# Patient Record
Sex: Male | Born: 1950 | Race: White | Hispanic: No | Marital: Single | Smoking: Former smoker
Health system: Southern US, Community
[De-identification: ages and names within clinical notes are randomized; demographics above are authoritative.]

## PROBLEM LIST (undated history)

## (undated) DIAGNOSIS — M1990 Unspecified osteoarthritis, unspecified site: Secondary | ICD-10-CM

## (undated) DIAGNOSIS — R5381 Other malaise: Secondary | ICD-10-CM

## (undated) DIAGNOSIS — E785 Hyperlipidemia, unspecified: Secondary | ICD-10-CM

## (undated) DIAGNOSIS — E1110 Type 2 diabetes mellitus with ketoacidosis without coma: Secondary | ICD-10-CM

## (undated) DIAGNOSIS — M545 Low back pain, unspecified: Secondary | ICD-10-CM

## (undated) DIAGNOSIS — I5032 Chronic diastolic (congestive) heart failure: Secondary | ICD-10-CM

## (undated) DIAGNOSIS — E119 Type 2 diabetes mellitus without complications: Secondary | ICD-10-CM

## (undated) DIAGNOSIS — Z9289 Personal history of other medical treatment: Secondary | ICD-10-CM

## (undated) DIAGNOSIS — I2510 Atherosclerotic heart disease of native coronary artery without angina pectoris: Secondary | ICD-10-CM

## (undated) DIAGNOSIS — L0591 Pilonidal cyst without abscess: Secondary | ICD-10-CM

## (undated) DIAGNOSIS — G4733 Obstructive sleep apnea (adult) (pediatric): Secondary | ICD-10-CM

## (undated) DIAGNOSIS — G629 Polyneuropathy, unspecified: Secondary | ICD-10-CM

## (undated) DIAGNOSIS — I10 Essential (primary) hypertension: Secondary | ICD-10-CM

## (undated) DIAGNOSIS — N492 Inflammatory disorders of scrotum: Secondary | ICD-10-CM

## (undated) DIAGNOSIS — R0902 Hypoxemia: Secondary | ICD-10-CM

## (undated) DIAGNOSIS — N183 Chronic kidney disease, stage 3 unspecified: Secondary | ICD-10-CM

## (undated) DIAGNOSIS — K922 Gastrointestinal hemorrhage, unspecified: Secondary | ICD-10-CM

## (undated) DIAGNOSIS — L0390 Cellulitis, unspecified: Secondary | ICD-10-CM

## (undated) DIAGNOSIS — I480 Paroxysmal atrial fibrillation: Secondary | ICD-10-CM

## (undated) DIAGNOSIS — G8929 Other chronic pain: Secondary | ICD-10-CM

## (undated) DIAGNOSIS — G5603 Carpal tunnel syndrome, bilateral upper limbs: Secondary | ICD-10-CM

## (undated) DIAGNOSIS — D509 Iron deficiency anemia, unspecified: Secondary | ICD-10-CM

## (undated) HISTORY — PX: EYE SURGERY: SHX253

## (undated) HISTORY — PX: APPENDECTOMY: SHX54

## (undated) HISTORY — DX: Hypoxemia: R09.02

## (undated) HISTORY — DX: Pilonidal cyst without abscess: L05.91

## (undated) HISTORY — DX: Other malaise: R53.81

## (undated) HISTORY — DX: Inflammatory disorders of scrotum: N49.2

## (undated) HISTORY — DX: Iron deficiency anemia, unspecified: D50.9

## (undated) HISTORY — DX: Chronic diastolic (congestive) heart failure: I50.32

## (undated) HISTORY — DX: Cellulitis, unspecified: L03.90

## (undated) HISTORY — PX: DEBRIDEMENT  FOOT: SUR387

## (undated) HISTORY — DX: Gastrointestinal hemorrhage, unspecified: K92.2

## (undated) HISTORY — PX: PILONIDAL CYST EXCISION: SHX744

## (undated) HISTORY — PX: TONSILLECTOMY: SUR1361

## (undated) HISTORY — DX: Paroxysmal atrial fibrillation: I48.0

## (undated) HISTORY — PX: ABDOMINAL SURGERY: SHX537

## (undated) HISTORY — DX: Hyperlipidemia, unspecified: E78.5

## (undated) HISTORY — DX: Atherosclerotic heart disease of native coronary artery without angina pectoris: I25.10

---

## 2008-07-12 HISTORY — PX: CARDIOVERSION: SHX1299

## 2008-07-29 ENCOUNTER — Ambulatory Visit: Payer: Self-pay | Admitting: Cardiology

## 2008-07-29 ENCOUNTER — Inpatient Hospital Stay (HOSPITAL_COMMUNITY): Admission: EM | Admit: 2008-07-29 | Discharge: 2008-08-07 | Payer: Self-pay | Admitting: Emergency Medicine

## 2008-07-30 ENCOUNTER — Encounter (INDEPENDENT_AMBULATORY_CARE_PROVIDER_SITE_OTHER): Payer: Self-pay | Admitting: Internal Medicine

## 2008-08-12 ENCOUNTER — Inpatient Hospital Stay (HOSPITAL_COMMUNITY): Admission: EM | Admit: 2008-08-12 | Discharge: 2008-08-27 | Payer: Self-pay | Admitting: Emergency Medicine

## 2008-08-12 ENCOUNTER — Ambulatory Visit: Payer: Self-pay | Admitting: Cardiology

## 2008-08-16 ENCOUNTER — Encounter (INDEPENDENT_AMBULATORY_CARE_PROVIDER_SITE_OTHER): Payer: Self-pay | Admitting: Internal Medicine

## 2008-08-19 ENCOUNTER — Ambulatory Visit: Payer: Self-pay | Admitting: Vascular Surgery

## 2008-08-19 ENCOUNTER — Encounter: Admission: RE | Admit: 2008-08-19 | Discharge: 2008-08-19 | Payer: Self-pay | Admitting: Internal Medicine

## 2008-08-27 ENCOUNTER — Encounter (INDEPENDENT_AMBULATORY_CARE_PROVIDER_SITE_OTHER): Payer: Self-pay | Admitting: *Deleted

## 2008-09-02 ENCOUNTER — Ambulatory Visit: Payer: Self-pay | Admitting: Family Medicine

## 2008-09-03 ENCOUNTER — Ambulatory Visit: Payer: Self-pay | Admitting: *Deleted

## 2008-09-23 ENCOUNTER — Encounter (HOSPITAL_BASED_OUTPATIENT_CLINIC_OR_DEPARTMENT_OTHER): Admission: RE | Admit: 2008-09-23 | Discharge: 2008-12-22 | Payer: Self-pay | Admitting: General Surgery

## 2008-09-23 DIAGNOSIS — L03119 Cellulitis of unspecified part of limb: Secondary | ICD-10-CM

## 2008-09-23 DIAGNOSIS — E118 Type 2 diabetes mellitus with unspecified complications: Secondary | ICD-10-CM

## 2008-09-23 DIAGNOSIS — E876 Hypokalemia: Secondary | ICD-10-CM | POA: Insufficient documentation

## 2008-09-23 DIAGNOSIS — E1165 Type 2 diabetes mellitus with hyperglycemia: Secondary | ICD-10-CM

## 2008-09-23 DIAGNOSIS — E785 Hyperlipidemia, unspecified: Secondary | ICD-10-CM | POA: Insufficient documentation

## 2008-09-23 DIAGNOSIS — L02419 Cutaneous abscess of limb, unspecified: Secondary | ICD-10-CM | POA: Insufficient documentation

## 2008-09-24 ENCOUNTER — Ambulatory Visit: Payer: Self-pay | Admitting: Cardiology

## 2008-09-24 ENCOUNTER — Encounter: Payer: Self-pay | Admitting: Cardiology

## 2008-09-24 LAB — CONVERTED CEMR LAB
AST: 23 units/L (ref 0–37)
Alkaline Phosphatase: 48 units/L (ref 39–117)
BUN: 26 mg/dL — ABNORMAL HIGH (ref 6–23)
Bilirubin, Direct: 0.2 mg/dL (ref 0.0–0.3)
CO2: 27 meq/L (ref 19–32)
Calcium: 9.3 mg/dL (ref 8.4–10.5)
Cholesterol: 158 mg/dL (ref 0–200)
Direct LDL: 72.4 mg/dL
GFR calc non Af Amer: 73.35 mL/min (ref >60)
HDL: 22.7 mg/dL — ABNORMAL LOW (ref >39.00)
Sodium: 141 meq/L (ref 135–145)
TSH: 0.39 microintl units/mL (ref 0.35–5.50)
Total Protein: 7 g/dL (ref 6.0–8.3)
VLDL: 79.4 mg/dL — ABNORMAL HIGH (ref 0.0–40.0)

## 2008-09-30 ENCOUNTER — Ambulatory Visit: Payer: Self-pay | Admitting: Cardiology

## 2008-10-01 ENCOUNTER — Encounter: Payer: Self-pay | Admitting: Family Medicine

## 2008-10-01 ENCOUNTER — Ambulatory Visit: Payer: Self-pay | Admitting: Internal Medicine

## 2008-10-01 LAB — CONVERTED CEMR LAB
ALT: 18 units/L (ref 0–53)
AST: 18 units/L (ref 0–37)
Albumin: 4.2 g/dL (ref 3.5–5.2)
Alkaline Phosphatase: 47 units/L (ref 39–117)
CO2: 21 meq/L (ref 19–32)
Calcium: 9 mg/dL (ref 8.4–10.5)
Chloride: 100 meq/L (ref 96–112)
Cholesterol: 190 mg/dL (ref 0–200)
Creatinine, Ser: 1.08 mg/dL (ref 0.40–1.50)
Eosinophils Absolute: 0.3 10*3/uL (ref 0.0–0.7)
Eosinophils Relative: 2 % (ref 0–5)
Lymphocytes Relative: 23 % (ref 12–46)
Lymphs Abs: 3.4 10*3/uL (ref 0.7–4.0)
MCV: 75.5 fL — ABNORMAL LOW (ref 78.0–100.0)
Monocytes Absolute: 1 10*3/uL (ref 0.1–1.0)
PSA: 0.68 ng/mL (ref 0.10–4.00)
Platelets: 288 10*3/uL (ref 150–400)
RBC: 5.27 M/uL (ref 4.22–5.81)
RDW: 15.8 % — ABNORMAL HIGH (ref 11.5–15.5)
TSH: 0.216 microintl units/mL — ABNORMAL LOW (ref 0.350–4.500)
Total Bilirubin: 0.6 mg/dL (ref 0.3–1.2)
Vit D, 25-Hydroxy: 15 ng/mL — ABNORMAL LOW (ref 30–89)
WBC: 14.7 10*3/uL — ABNORMAL HIGH (ref 4.0–10.5)

## 2008-10-03 ENCOUNTER — Ambulatory Visit: Payer: Self-pay | Admitting: Internal Medicine

## 2008-10-21 ENCOUNTER — Ambulatory Visit: Payer: Self-pay | Admitting: Internal Medicine

## 2008-10-22 ENCOUNTER — Ambulatory Visit: Payer: Self-pay | Admitting: Cardiology

## 2008-10-22 ENCOUNTER — Encounter: Payer: Self-pay | Admitting: Cardiology

## 2008-10-22 DIAGNOSIS — I4891 Unspecified atrial fibrillation: Secondary | ICD-10-CM

## 2008-10-22 DIAGNOSIS — I10 Essential (primary) hypertension: Secondary | ICD-10-CM

## 2008-10-23 ENCOUNTER — Ambulatory Visit: Payer: Self-pay | Admitting: Internal Medicine

## 2008-10-28 ENCOUNTER — Ambulatory Visit (HOSPITAL_COMMUNITY): Admission: RE | Admit: 2008-10-28 | Discharge: 2008-10-28 | Payer: Self-pay | Admitting: Cardiology

## 2008-11-04 ENCOUNTER — Ambulatory Visit: Payer: Self-pay | Admitting: Cardiology

## 2008-11-11 ENCOUNTER — Encounter (HOSPITAL_COMMUNITY): Admission: RE | Admit: 2008-11-11 | Discharge: 2009-02-09 | Payer: Self-pay | Admitting: Cardiology

## 2008-11-12 ENCOUNTER — Ambulatory Visit: Payer: Self-pay | Admitting: Cardiology

## 2008-11-13 ENCOUNTER — Encounter (INDEPENDENT_AMBULATORY_CARE_PROVIDER_SITE_OTHER): Payer: Self-pay | Admitting: *Deleted

## 2008-11-16 ENCOUNTER — Ambulatory Visit: Payer: Self-pay | Admitting: Cardiology

## 2008-11-17 ENCOUNTER — Inpatient Hospital Stay (HOSPITAL_COMMUNITY): Admission: EM | Admit: 2008-11-17 | Discharge: 2008-11-23 | Payer: Self-pay | Admitting: Emergency Medicine

## 2008-11-18 ENCOUNTER — Encounter: Payer: Self-pay | Admitting: Internal Medicine

## 2008-11-19 ENCOUNTER — Ambulatory Visit: Payer: Self-pay | Admitting: Internal Medicine

## 2008-12-02 ENCOUNTER — Ambulatory Visit: Payer: Self-pay | Admitting: Cardiovascular Disease

## 2008-12-25 ENCOUNTER — Encounter (INDEPENDENT_AMBULATORY_CARE_PROVIDER_SITE_OTHER): Payer: Self-pay | Admitting: Cardiology

## 2008-12-25 ENCOUNTER — Ambulatory Visit: Payer: Self-pay | Admitting: Internal Medicine

## 2008-12-26 ENCOUNTER — Encounter (HOSPITAL_BASED_OUTPATIENT_CLINIC_OR_DEPARTMENT_OTHER): Admission: RE | Admit: 2008-12-26 | Discharge: 2009-01-21 | Payer: Self-pay | Admitting: Internal Medicine

## 2008-12-27 LAB — CONVERTED CEMR LAB
BUN: 17 mg/dL (ref 6–23)
CO2: 29 meq/L (ref 19–32)
Calcium: 8.9 mg/dL (ref 8.4–10.5)
Chloride: 98 meq/L (ref 96–112)
Creatinine, Ser: 0.8 mg/dL (ref 0.4–1.5)
Glucose, Bld: 173 mg/dL — ABNORMAL HIGH (ref 70–99)
Potassium: 4 meq/L (ref 3.5–5.1)
Sodium: 139 meq/L (ref 135–145)

## 2009-01-02 ENCOUNTER — Ambulatory Visit: Payer: Self-pay | Admitting: Cardiology

## 2009-01-02 DIAGNOSIS — R9430 Abnormal result of cardiovascular function study, unspecified: Secondary | ICD-10-CM | POA: Insufficient documentation

## 2009-01-02 DIAGNOSIS — D649 Anemia, unspecified: Secondary | ICD-10-CM

## 2009-01-02 DIAGNOSIS — E079 Disorder of thyroid, unspecified: Secondary | ICD-10-CM | POA: Insufficient documentation

## 2009-01-03 ENCOUNTER — Encounter: Payer: Self-pay | Admitting: Cardiology

## 2009-01-03 ENCOUNTER — Telehealth: Payer: Self-pay | Admitting: Cardiology

## 2009-01-06 ENCOUNTER — Ambulatory Visit: Payer: Self-pay | Admitting: Internal Medicine

## 2009-01-06 ENCOUNTER — Ambulatory Visit: Payer: Self-pay | Admitting: Cardiology

## 2009-01-06 DIAGNOSIS — D689 Coagulation defect, unspecified: Secondary | ICD-10-CM

## 2009-01-06 DIAGNOSIS — K625 Hemorrhage of anus and rectum: Secondary | ICD-10-CM | POA: Insufficient documentation

## 2009-01-06 DIAGNOSIS — D509 Iron deficiency anemia, unspecified: Secondary | ICD-10-CM

## 2009-01-08 ENCOUNTER — Ambulatory Visit: Payer: Self-pay | Admitting: Cardiology

## 2009-01-15 ENCOUNTER — Encounter: Payer: Self-pay | Admitting: *Deleted

## 2009-01-15 ENCOUNTER — Ambulatory Visit: Payer: Self-pay | Admitting: Internal Medicine

## 2009-01-17 ENCOUNTER — Ambulatory Visit: Payer: Self-pay | Admitting: Internal Medicine

## 2009-01-22 ENCOUNTER — Emergency Department (HOSPITAL_COMMUNITY): Admission: EM | Admit: 2009-01-22 | Discharge: 2009-01-22 | Payer: Self-pay | Admitting: Emergency Medicine

## 2009-01-29 ENCOUNTER — Ambulatory Visit: Payer: Self-pay | Admitting: Cardiology

## 2009-01-29 LAB — CONVERTED CEMR LAB
POC INR: 2.3
Prothrombin Time: 18.4 s

## 2009-02-05 ENCOUNTER — Ambulatory Visit: Payer: Self-pay | Admitting: Cardiology

## 2009-02-05 LAB — CONVERTED CEMR LAB
POC INR: 3.3
Prothrombin Time: 21.8 s

## 2009-02-10 ENCOUNTER — Telehealth (INDEPENDENT_AMBULATORY_CARE_PROVIDER_SITE_OTHER): Payer: Self-pay | Admitting: *Deleted

## 2009-02-18 ENCOUNTER — Ambulatory Visit: Payer: Self-pay | Admitting: Internal Medicine

## 2009-02-18 LAB — CONVERTED CEMR LAB: Prothrombin Time: 16.8 s

## 2009-02-26 ENCOUNTER — Ambulatory Visit: Payer: Self-pay | Admitting: Cardiology

## 2009-03-04 ENCOUNTER — Ambulatory Visit: Payer: Self-pay | Admitting: Internal Medicine

## 2009-03-04 LAB — CONVERTED CEMR LAB: POC INR: 3.2

## 2009-03-07 ENCOUNTER — Telehealth (INDEPENDENT_AMBULATORY_CARE_PROVIDER_SITE_OTHER): Payer: Self-pay | Admitting: *Deleted

## 2009-03-12 ENCOUNTER — Ambulatory Visit: Payer: Self-pay | Admitting: Internal Medicine

## 2009-03-14 ENCOUNTER — Telehealth: Payer: Self-pay | Admitting: Cardiology

## 2009-03-20 ENCOUNTER — Ambulatory Visit: Payer: Self-pay | Admitting: Cardiovascular Disease

## 2009-03-20 LAB — CONVERTED CEMR LAB: POC INR: 3.1

## 2009-03-24 ENCOUNTER — Telehealth: Payer: Self-pay | Admitting: Cardiology

## 2009-03-27 ENCOUNTER — Ambulatory Visit: Payer: Self-pay | Admitting: Cardiology

## 2009-03-27 LAB — CONVERTED CEMR LAB: POC INR: 2.9

## 2009-04-03 ENCOUNTER — Ambulatory Visit: Payer: Self-pay | Admitting: Internal Medicine

## 2009-04-07 ENCOUNTER — Encounter: Payer: Self-pay | Admitting: Family Medicine

## 2009-04-07 ENCOUNTER — Ambulatory Visit: Payer: Self-pay | Admitting: Internal Medicine

## 2009-04-07 LAB — CONVERTED CEMR LAB
AST: 13 units/L (ref 0–37)
Basophils Relative: 0 % (ref 0–1)
Benzodiazepines.: NEGATIVE
CO2: 24 meq/L (ref 19–32)
Calcium: 9.2 mg/dL (ref 8.4–10.5)
Cocaine Metabolites: NEGATIVE
Creatinine, Ser: 0.93 mg/dL (ref 0.40–1.50)
Creatinine,U: 105.9 mg/dL
Free T4: 1.09 ng/dL (ref 0.80–1.80)
Glucose, Bld: 302 mg/dL — ABNORMAL HIGH (ref 70–99)
Hemoglobin: 11.6 g/dL — ABNORMAL LOW (ref 13.0–17.0)
Hgb A1c MFr Bld: 9.8 % — ABNORMAL HIGH (ref 4.6–6.1)
MCHC: 32.1 g/dL (ref 30.0–36.0)
MCV: 74.6 fL — ABNORMAL LOW (ref 78.0–100.0)
Marijuana Metabolite: NEGATIVE
Monocytes Absolute: 0.9 10*3/uL (ref 0.1–1.0)
Neutro Abs: 6.1 10*3/uL (ref 1.7–7.7)
Neutrophils Relative %: 58 % (ref 43–77)
Phencyclidine (PCP): NEGATIVE
Platelets: 252 10*3/uL (ref 150–400)
Propoxyphene: NEGATIVE
RDW: 15.3 % (ref 11.5–15.5)
Sodium: 139 meq/L (ref 135–145)
TSH: 0.22 microintl units/mL — ABNORMAL LOW (ref 0.350–4.500)

## 2009-04-15 ENCOUNTER — Telehealth: Payer: Self-pay | Admitting: Cardiology

## 2009-04-15 ENCOUNTER — Encounter: Payer: Self-pay | Admitting: Cardiology

## 2009-04-16 ENCOUNTER — Telehealth: Payer: Self-pay | Admitting: Cardiology

## 2009-04-18 ENCOUNTER — Encounter (INDEPENDENT_AMBULATORY_CARE_PROVIDER_SITE_OTHER): Payer: Self-pay | Admitting: Pharmacist

## 2009-04-18 ENCOUNTER — Encounter: Payer: Self-pay | Admitting: Internal Medicine

## 2009-04-18 LAB — CONVERTED CEMR LAB
POC INR: 1.99
Prothrombin Time: 20.9 s

## 2009-04-28 ENCOUNTER — Encounter: Payer: Self-pay | Admitting: Internal Medicine

## 2009-04-30 ENCOUNTER — Encounter (INDEPENDENT_AMBULATORY_CARE_PROVIDER_SITE_OTHER): Payer: Self-pay | Admitting: Pharmacist

## 2009-04-30 ENCOUNTER — Encounter: Payer: Self-pay | Admitting: Internal Medicine

## 2009-04-30 LAB — CONVERTED CEMR LAB
POC INR: 1.99
Prothrombin Time: 20.9 s

## 2009-05-08 ENCOUNTER — Ambulatory Visit: Payer: Self-pay | Admitting: Cardiology

## 2009-05-09 ENCOUNTER — Ambulatory Visit: Payer: Self-pay | Admitting: Cardiology

## 2009-05-09 ENCOUNTER — Encounter (INDEPENDENT_AMBULATORY_CARE_PROVIDER_SITE_OTHER): Payer: Self-pay | Admitting: *Deleted

## 2009-05-12 ENCOUNTER — Telehealth (INDEPENDENT_AMBULATORY_CARE_PROVIDER_SITE_OTHER): Payer: Self-pay | Admitting: *Deleted

## 2009-05-15 ENCOUNTER — Ambulatory Visit: Payer: Self-pay | Admitting: Internal Medicine

## 2009-05-15 LAB — CONVERTED CEMR LAB: POC INR: 2.6

## 2009-05-16 ENCOUNTER — Encounter: Payer: Self-pay | Admitting: Cardiology

## 2009-05-16 ENCOUNTER — Ambulatory Visit: Payer: Self-pay | Admitting: Cardiology

## 2009-05-16 ENCOUNTER — Ambulatory Visit (HOSPITAL_COMMUNITY): Admission: RE | Admit: 2009-05-16 | Discharge: 2009-05-16 | Payer: Self-pay | Admitting: Cardiology

## 2009-05-21 ENCOUNTER — Ambulatory Visit: Payer: Self-pay | Admitting: Internal Medicine

## 2009-05-22 ENCOUNTER — Telehealth: Payer: Self-pay | Admitting: Cardiology

## 2009-05-28 ENCOUNTER — Ambulatory Visit: Payer: Self-pay | Admitting: Cardiovascular Disease

## 2009-05-28 LAB — CONVERTED CEMR LAB: POC INR: 4

## 2009-06-02 ENCOUNTER — Telehealth (INDEPENDENT_AMBULATORY_CARE_PROVIDER_SITE_OTHER): Payer: Self-pay | Admitting: *Deleted

## 2009-06-04 ENCOUNTER — Ambulatory Visit: Payer: Self-pay | Admitting: Internal Medicine

## 2009-06-10 ENCOUNTER — Telehealth: Payer: Self-pay | Admitting: Internal Medicine

## 2009-06-11 ENCOUNTER — Ambulatory Visit: Payer: Self-pay | Admitting: Cardiology

## 2009-06-11 ENCOUNTER — Encounter (INDEPENDENT_AMBULATORY_CARE_PROVIDER_SITE_OTHER): Payer: Self-pay | Admitting: *Deleted

## 2009-06-11 ENCOUNTER — Encounter: Payer: Self-pay | Admitting: Cardiology

## 2009-06-17 ENCOUNTER — Ambulatory Visit: Payer: Self-pay | Admitting: Cardiovascular Disease

## 2009-06-18 ENCOUNTER — Ambulatory Visit: Payer: Self-pay | Admitting: Cardiology

## 2009-06-18 ENCOUNTER — Telehealth (INDEPENDENT_AMBULATORY_CARE_PROVIDER_SITE_OTHER): Payer: Self-pay | Admitting: Cardiology

## 2009-06-18 ENCOUNTER — Ambulatory Visit (HOSPITAL_COMMUNITY): Admission: RE | Admit: 2009-06-18 | Discharge: 2009-06-18 | Payer: Self-pay | Admitting: Cardiology

## 2009-06-25 ENCOUNTER — Ambulatory Visit: Payer: Self-pay | Admitting: Cardiology

## 2009-07-02 ENCOUNTER — Ambulatory Visit: Payer: Self-pay | Admitting: Internal Medicine

## 2009-07-16 ENCOUNTER — Ambulatory Visit: Payer: Self-pay | Admitting: Cardiovascular Disease

## 2009-08-04 ENCOUNTER — Telehealth (INDEPENDENT_AMBULATORY_CARE_PROVIDER_SITE_OTHER): Payer: Self-pay | Admitting: *Deleted

## 2009-08-07 ENCOUNTER — Encounter: Payer: Self-pay | Admitting: Cardiology

## 2009-08-08 ENCOUNTER — Ambulatory Visit: Payer: Self-pay | Admitting: Cardiology

## 2009-08-14 ENCOUNTER — Ambulatory Visit: Payer: Self-pay | Admitting: Cardiology

## 2009-08-31 ENCOUNTER — Emergency Department (HOSPITAL_COMMUNITY): Admission: EM | Admit: 2009-08-31 | Discharge: 2009-08-31 | Payer: Self-pay | Admitting: Family Medicine

## 2009-09-01 ENCOUNTER — Ambulatory Visit: Payer: Self-pay | Admitting: Internal Medicine

## 2009-09-05 ENCOUNTER — Ambulatory Visit: Payer: Self-pay | Admitting: Internal Medicine

## 2009-09-11 ENCOUNTER — Ambulatory Visit: Payer: Self-pay | Admitting: Cardiology

## 2009-09-24 ENCOUNTER — Ambulatory Visit: Payer: Self-pay | Admitting: Internal Medicine

## 2009-09-24 LAB — CONVERTED CEMR LAB
Glucose, Bld: 196 mg/dL — ABNORMAL HIGH (ref 70–99)
Hgb A1c MFr Bld: 7.5 % — ABNORMAL HIGH (ref 4.6–6.1)
Potassium: 3.8 meq/L (ref 3.5–5.3)
Sodium: 141 meq/L (ref 135–145)

## 2009-09-25 ENCOUNTER — Ambulatory Visit: Payer: Self-pay | Admitting: Internal Medicine

## 2009-09-25 LAB — CONVERTED CEMR LAB: POC INR: 1.8

## 2009-10-02 ENCOUNTER — Ambulatory Visit: Payer: Self-pay | Admitting: Internal Medicine

## 2009-10-08 ENCOUNTER — Ambulatory Visit: Payer: Self-pay | Admitting: Cardiology

## 2009-10-09 LAB — CONVERTED CEMR LAB
Alkaline Phosphatase: 45 units/L (ref 39–117)
BUN: 14 mg/dL (ref 6–23)
Bilirubin, Direct: 0.1 mg/dL (ref 0.0–0.3)
Chloride: 105 meq/L (ref 96–112)
Glucose, Bld: 56 mg/dL — ABNORMAL LOW (ref 70–99)
Potassium: 3.4 meq/L — ABNORMAL LOW (ref 3.5–5.1)

## 2009-10-28 ENCOUNTER — Ambulatory Visit: Payer: Self-pay | Admitting: Cardiovascular Disease

## 2009-10-28 ENCOUNTER — Ambulatory Visit: Payer: Self-pay | Admitting: Cardiology

## 2009-10-28 ENCOUNTER — Encounter (INDEPENDENT_AMBULATORY_CARE_PROVIDER_SITE_OTHER): Payer: Self-pay | Admitting: *Deleted

## 2009-10-29 ENCOUNTER — Ambulatory Visit: Payer: Self-pay | Admitting: Family Medicine

## 2009-10-29 ENCOUNTER — Telehealth (INDEPENDENT_AMBULATORY_CARE_PROVIDER_SITE_OTHER): Payer: Self-pay | Admitting: *Deleted

## 2009-11-06 ENCOUNTER — Encounter (INDEPENDENT_AMBULATORY_CARE_PROVIDER_SITE_OTHER): Payer: Self-pay | Admitting: Internal Medicine

## 2009-11-06 ENCOUNTER — Ambulatory Visit: Payer: Self-pay | Admitting: Family Medicine

## 2009-11-13 LAB — CONVERTED CEMR LAB
BUN: 21 mg/dL (ref 6–23)
CO2: 26 meq/L (ref 19–32)
Calcium: 9.4 mg/dL (ref 8.4–10.5)
Chloride: 102 meq/L (ref 96–112)
Cholesterol: 186 mg/dL (ref 0–200)
Creatinine, Ser: 1.13 mg/dL (ref 0.40–1.50)
HDL: 41 mg/dL (ref >39)
Total CHOL/HDL Ratio: 4.5

## 2009-11-19 ENCOUNTER — Ambulatory Visit: Payer: Self-pay | Admitting: Cardiology

## 2009-11-19 LAB — CONVERTED CEMR LAB: POC INR: 2.8

## 2009-11-27 ENCOUNTER — Ambulatory Visit: Payer: Self-pay | Admitting: Family Medicine

## 2009-12-25 ENCOUNTER — Ambulatory Visit: Payer: Self-pay | Admitting: Cardiology

## 2010-01-22 ENCOUNTER — Encounter (INDEPENDENT_AMBULATORY_CARE_PROVIDER_SITE_OTHER): Payer: Self-pay | Admitting: *Deleted

## 2010-02-03 ENCOUNTER — Ambulatory Visit: Payer: Self-pay | Admitting: Cardiology

## 2010-02-24 ENCOUNTER — Telehealth (INDEPENDENT_AMBULATORY_CARE_PROVIDER_SITE_OTHER): Payer: Self-pay | Admitting: *Deleted

## 2010-03-17 ENCOUNTER — Telehealth: Payer: Self-pay | Admitting: Cardiology

## 2010-03-31 ENCOUNTER — Encounter: Payer: Self-pay | Admitting: Cardiology

## 2010-03-31 ENCOUNTER — Encounter (INDEPENDENT_AMBULATORY_CARE_PROVIDER_SITE_OTHER): Payer: Self-pay | Admitting: Pharmacist

## 2010-03-31 LAB — CONVERTED CEMR LAB
POC INR: 6.1
Prothrombin Time: 61.9 s

## 2010-04-02 ENCOUNTER — Encounter: Payer: Self-pay | Admitting: Cardiology

## 2010-04-06 ENCOUNTER — Encounter: Payer: Self-pay | Admitting: Cardiovascular Disease

## 2010-04-06 LAB — CONVERTED CEMR LAB: POC INR: 1.3

## 2010-08-02 ENCOUNTER — Encounter: Payer: Self-pay | Admitting: Cardiology

## 2010-08-06 ENCOUNTER — Telehealth: Payer: Self-pay | Admitting: Cardiology

## 2010-08-11 NOTE — Medication Information (Signed)
Summary: rov/ewj  Anticoagulant Therapy  Managed by: Bethena Midget, RN, BSN Referring MD: [name] MD, Arlys John PCP: Alfonse Ras, MD Supervising MD: Gala Romney MD, Reuel Boom Indication 1: Atrial Fibrillation Indication 2:  - Lab Used: [facility] [place] Site: [address] INR POC 1.8 INR RANGE 2.0-3.0  Dietary changes: no    Health status changes: no    Bleeding/hemorrhagic complications: no    Recent/future hospitalizations: no    Any changes in medication regimen? yes       Details: 08/08/09 Dr. [name] decreased Amiodarone from 400mg s BID to 200mg  daily  Recent/future dental: no  Any missed doses?: no       Is patient compliant with meds? yes      Comments: Lyrica dose increased last week  Current Medications (verified): 1)  Furosemide 40 Mg Tabs (Furosemide) .... Take 1 Tab By Mouth Once Daily 2)  Glucovance 5-500 Mg Tabs (Glyburide-Metformin) .[name].. 1 Tab By Mouth Two Times A Day 3)  Warfarin Sodium 5 Mg Tabs (Warfarin Sodium) .... Take As Directed By Coumadin Clinic. 4)  Metoprolol Succinate 100 Mg Xr24h-Tab (Metoprolol Succinate) .[name].. 1 1/2 Tab By Mouth Once Daily 5)  Novolog 100 Unit/ml Soln (Insulin Aspart) .... Sliding Scale 6)  Lantus 100 Unit/ml Soln (Insulin Glargine) .... Take 75 Units Subcutaneously At Bedtime 7)  Multivitamins  Tabs (Multiple Vitamin) .... Take 1 Tablet By Mouth Once A Day 8)  Gemfibrozil 600 Mg Tabs (Gemfibrozil) .[name].. 1 Tab By Mouth Two Times A Day 9)  Amiodarone Hcl 200 Mg Tabs (Amiodarone Hcl) .... Take One Tablet By Mouth Once A Day 10)  Lyrica .... 2 Tabs By Mouth Bid  Allergies: No Known Drug Allergies  Anticoagulation Management History:      The patient is taking warfarin and comes in today for a routine follow up visit.  Positive risk factors for bleeding include presence of serious comorbidities.  Negative risk factors for bleeding include an age less than 39 years old.  The bleeding index is 'intermediate risk'.  Positive  CHADS2 values include History of HTN and History of Diabetes.  Negative CHADS2 values include Age > 59 years old.  Anticoagulation responsible provider: Bensimhon MD, Reuel Boom.  INR POC: 1.8.  Cuvette Lot#: [id number].  Exp: 10/2010.    Anticoagulation Management Assessment/Plan:      The patient's current anticoagulation dose is Warfarin sodium 5 mg tabs: Take as directed by coumadin clinic..  The target INR is 2.0-3.0.  The next INR is due 09/11/2009.  Anticoagulation instructions were given to patient.  Results were reviewed/authorized by Bethena Midget, RN, BSN.  He was notified by Bethena Midget, RN, BSN.         Prior Anticoagulation Instructions: INR 3.3  take 1/2 tablet tomorrow then start taking 5mg  daily.  Recheck in 2 weeks.    Current Anticoagulation Instructions: INR 1.8 Today take extra 1/2 tablet then change dose to 1 tablet everyday except 1 1/2 tablets on Wednesdays. Recheck in 10 days.

## 2010-08-11 NOTE — Medication Information (Signed)
Summary: rov/cb  Anticoagulant Therapy  Managed by: Weston Brass, PharmD Referring MD: [name] MD, Arlys John PCP: Alfonse Ras, MD Supervising MD: Myrtis Ser MD, Tinnie Gens Indication 1: Atrial Fibrillation Indication 2:  - Lab Used: [facility] [place] Site: [address] INR POC 2.8 INR RANGE 2.0-3.0  Dietary changes: no    Health status changes: no    Bleeding/hemorrhagic complications: no    Recent/future hospitalizations: no    Any changes in medication regimen? no    Recent/future dental: no  Any missed doses?: no       Is patient compliant with meds? yes       Allergies: No Known Drug Allergies  Anticoagulation Management History:      The patient is taking warfarin and comes in today for a routine follow up visit.  Positive risk factors for bleeding include presence of serious comorbidities.  Negative risk factors for bleeding include an age less than 64 years old.  The bleeding index is 'intermediate risk'.  Positive CHADS2 values include History of CHF, History of HTN, and History of Diabetes.  Negative CHADS2 values include Age > 3 years old.  Anticoagulation responsible provider: Myrtis Ser MD, Tinnie Gens.  INR POC: 2.8.  Cuvette Lot#: [id number].  Exp: 02/2011.    Anticoagulation Management Assessment/Plan:      The patient's current anticoagulation dose is Warfarin sodium 5 mg tabs: Take as directed by coumadin clinic..  The target INR is 2.0-3.0.  The next INR is due 12/17/2009.  Anticoagulation instructions were given to patient.  Results were reviewed/authorized by Weston Brass, PharmD.  He was notified by Weston Brass PharmD.         Prior Anticoagulation Instructions: INR 2.0. Take 1 tablet daily except 1.5 tablets Tues and Thurs.  Current Anticoagulation Instructions: INR 2.8  Continue same dose of 1 tablet every day except 1 1/2 tablets on Tuesday and Thursday

## 2010-08-11 NOTE — Assessment & Plan Note (Signed)
Summary: F3M/DM   Primary Provider:  Alfonse Ras, MD  CC:  sob.  History of Present Illness: Pleasant gentleman that I have  seen for atrial fibrillation. Previously admitted to [facility] with weakness and bradycardia. This was felt secondary to AV nodal blocking agents which were held and he did improve. He ultimately underwent successful TEE guided cardioversion. Note LV function by echocardiogram in May was normal. A Myoview in early May revealed fixed defect in the inferior wall with mild reversibility suggesting infarct with peri-infarct ischemia. Septal hypokinesis and apical dyskinesis. Left ventricular ejection fraction equal 51 %. We are treating this medically as the patient has no symptoms. He is also morbidly obese and diaphragmatic attenuation is a possibility. Note a TSH was low but her free T3 and free T4 were normal. He has been followed by endocrinology for this. I last saw him on November of 2010. We scheduled a cardioversion which was performed on June 18, 2009. The procedure was successful. Since then he has mild dyspnea on exertion relieved with rest. It is not associated with chest pain. There is no orthopnea, PND, pedal edema, palpitations, syncope or bleeding.  Current Medications (verified): 1)  Furosemide 40 Mg Tabs (Furosemide) .... Take 1 Tab By Mouth Once Daily 2)  Glucovance 5-500 Mg Tabs (Glyburide-Metformin) .[name].. 1 Tab By Mouth Two Times A Day 3)  Warfarin Sodium 5 Mg Tabs (Warfarin Sodium) .... Take As Directed By Coumadin Clinic. 4)  Metoprolol Succinate 100 Mg Xr24h-Tab (Metoprolol Succinate) .[name].. 1 1/2 Tab By Mouth Once Daily 5)  Novolog 100 Unit/ml Soln (Insulin Aspart) .... Sliding Scale 6)  Lantus 100 Unit/ml Soln (Insulin Glargine) .... Take 75 Units Subcutaneously At Bedtime 7)  Multivitamins  Tabs (Multiple Vitamin) .... Take 1 Tablet By Mouth Once A Day 8)  Gemfibrozil 600 Mg Tabs (Gemfibrozil) .[name].. 1 Tab By Mouth Two Times A Day 9)  Amiodarone  Hcl 200 Mg Tabs (Amiodarone Hcl) .... Take Two Tablets By Mouth Twice A Day 10)  Lyrica .... 2 Tabs By Mouth Once Daily  Allergies: No Known Drug Allergies  Past History:  Past Medical History: Reviewed history from 05/09/2009 and no changes required. Anemia Atrial Fibrillation Diabetes Type 1 Hyperlipidemia hypertension recurrent cellulitis morbid obesity acute renal insufficiency improved  Past Surgical History: Reviewed history from 01/06/2009 and no changes required. 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 Patient is a former smoker. -stopped 10 years ago Daily Caffeine Use-2 cups daily Illicit Drug Use - no Patient does not get regular exercise.   Review of Systems       no fevers or chills, productive cough, hemoptysis, dysphasia, odynophagia, melena, hematochezia, dysuria, hematuria, rash, seizure activity, orthopnea, PND, pedal edema, claudication. Remaining systems are negative.   Vital Signs:  Patient profile:   60 year old male Height:      72 inches Weight:      347 pounds BMI:     47.23 Pulse rate:   68 / minute Resp:     14 per minute BP sitting:   124 / 57  (left arm)  Vitals Entered By: Kem Parkinson (August 08, 2009 11:43 AM)  Physical Exam  General:  Well-developed obese in no acute distress.  Skin is warm  and dry.  HEENT is normal.  Neck is supple. No thyromegaly.  Chest is clear to auscultation with normal expansion.  Cardiovascular exam is regular rate and rhythm.  Abdominal exam nontender or distended. No masses palpated. Extremities show no edema. neuro grossly intact    EKG  Procedure date:  08/08/2009  Findings:      Sinus rhythm at a rate of 68. First degree AV block.  Nonspecific ST changes.  Impression & Recommendations:  Problem # 1:  ATRIAL FIBRILLATION (ICD-427.31) The patient remains in sinus rhythm status post cardioversion. I will decrease his amiodarone to 200 mg p.o. daily. He will need to continue on his Coumadin with a goal INR of 2-3. In 2 months he will have a TSH, liver functions and chest x-ray given his amiodarone use. He will return to see me in 3 months. His updated medication list for this problem includes:    Warfarin Sodium 5 Mg Tabs (Warfarin sodium) .[name]... Take as directed by coumadin clinic.    Metoprolol Succinate 100 Mg Xr24h-tab (Metoprolol succinate) .[name]... 1 1/2 tab by mouth once daily    Amiodarone Hcl 200 Mg Tabs (Amiodarone hcl) .[name]... Take one tablet by mouth once a day  Problem # 2:  CARDIOVASCULAR FUNCTION STUDY, ABNORMAL (ICD-794.30) Previous Myoview abnormal but low risk. For now we are treating medically as he is not having chest pain.  Problem # 3:  DIABETES MELLITUS-TYPE II (ICD-250.00)  The following medications were removed from the medication list:    Humalog 100 Unit/ml Soln (Insulin lispro (human)) ..... Sliding scale His updated medication list for this problem includes:    Glucovance 5-500 Mg Tabs (Glyburide-metformin) .[name]... 1 tab by mouth two times a day    Novolog 100 Unit/ml Soln (Insulin aspart) ..... Sliding scale    Lantus 100 Unit/ml Soln (Insulin glargine) .[name]... Take 75 units subcutaneously at bedtime  The following medications were removed from the medication list:    Humalog 100 Unit/ml Soln (Insulin lispro (human)) ..... Sliding scale His updated medication list for this problem includes:    Glucovance 5-500 Mg Tabs (Glyburide-metformin) .[name]... 1 tab by mouth two times a day    Novolog 100 Unit/ml Soln (Insulin aspart) ..... Sliding scale    Lantus 100 Unit/ml Soln (Insulin glargine) .[name]... Take 75 units subcutaneously at bedtime  Orders: Primary Care Referral (Primary)  Problem # 4:  THYROID  STIMULATING HORMONE, ABNORMAL (ICD-246.9) Followup with his primary care physician.  Problem # 5:  ESSENTIAL HYPERTENSION, BENIGN (ICD-401.1)  Blood pressure controlled on present medications. Will continue. Check bmet in 2 months. His updated medication list for this problem includes:    Furosemide 40 Mg Tabs (Furosemide) .[name]... Take 1 tab by mouth once daily    Metoprolol Succinate 100 Mg Xr24h-tab (Metoprolol succinate) .[name]... 1 1/2 tab by mouth once daily  His updated medication list for this problem includes:    Furosemide 40 Mg Tabs (Furosemide) .[name]... Take 1 tab by mouth once daily    Metoprolol Succinate 100 Mg Xr24h-tab (Metoprolol succinate) .[name]... 1 1/2 tab by mouth once daily  Problem # 6:  HYPERLIPIDEMIA-MIXED (ICD-272.4)  Lipids and liver monitored by his primary care. The following medications were removed from the medication list:    Crestor 20 Mg Tabs (Rosuvastatin calcium) .[name]... Take one tablet by mouth daily. His updated medication list for this problem includes:    Gemfibrozil 600 Mg Tabs (Gemfibrozil) .[name]... 1 tab by mouth two times a day  The following medications  were removed from the medication list:    Crestor 20 Mg Tabs (Rosuvastatin calcium) .[name]... Take one tablet by mouth daily. His updated medication list for this problem includes:    Gemfibrozil 600 Mg Tabs (Gemfibrozil) .[name]... 1 tab by mouth two times a day  Problem # 7:  COUMADIN THERAPY (ICD-V58.61) Monitored in the Coumadin clinic. Goal INR 2-3.  Patient Instructions: 1)  Your physician recommends that you return for lab work in: 2 months for LFT,TSH, BMET and Chest Xray off Amiodorone. Pt. aware to hold medication the morning of the  test. 2)  Your physician has recommended you make the following change in your medication: Amiodorone 200 mg once a day. 3)  Your physician recommends that you schedule a follow-up appointment in: 3 months 4)  You have been referred to [facility] Primary care Physician for  Diabetic  management. Prescriptions: AMIODARONE HCL 200 MG TABS (AMIODARONE HCL) Take one tablet by mouth once a day  #30 x 6   Entered by:   Ollen Gross, RN, BSN   Authorized by:   Ferman Hamming, MD, [facility]   Signed by:   Ollen Gross, RN, BSN on 08/08/2009   Method used:   Electronically to        [facility] [phone]* (retail)       [address]       [place], [place]  [address]       Ph: [phone] or [phone]       Fax: [phone]   RxID:   [id number]

## 2010-08-11 NOTE — Assessment & Plan Note (Signed)
Summary: [name]   Primary Provider:  Alfonse Ras, MD   History of Present Illness: Pleasant gentleman that I have  seen for atrial fibrillation. Transesophageal echocardiogram in November of 2010 revealed normal LV function, moderate left atrial enlargement, mild right atrial enlargement, possible left atrial appendage thrombus and mild mitral regurgitation.  A Myoview in May 2010  revealed fixed defect in the inferior wall with mild reversibility suggesting infarct with peri-infarct ischemia. Septal hypokinesis and apical dyskinesis. Left ventricular ejection fraction equal 51 %. We are treating this medically as the patient has no symptoms. He is also morbidly obese and diaphragmatic attenuation is a possibility. Note a TSH was low but her free T3 and free T4 were normal. He has been followed by endocrinology for this. We scheduled a cardioversion which was performed on June 18, 2009. The procedure was successful. I last saw him in January of 2011. Chest xray on Paul/30/11 showed Increase in interstitial lung markings since the prior study.  Some of  this may be technical but there may be some underlying scarring present. TSH and liver function is normal. Since I last saw him  he has  dyspnea on exertion relieved with rest. It is not associated with chest pain. He also describes orthopnea as well as pedal edema. There is no PND. He has not had chest pain, palpitations, syncope or bleeding.  Current Medications (verified): 1)  Furosemide 40 Mg Tabs (Furosemide) .... Take 1 Tab By Mouth Once Daily 2)  Glucovance 5-500 Mg Tabs (Glyburide-Metformin) .[name].. 1 Tab By Mouth Two Times A Day Paul)  Warfarin Sodium 5 Mg Tabs (Warfarin Sodium) .... Take As Directed By Coumadin Clinic. 4)  Metoprolol Succinate 100 Mg Xr24h-Tab (Metoprolol Succinate) .[name].. 1 1/2 Tab By Mouth Once Daily 5)  Novolog 100 Unit/ml Soln (Insulin Aspart) .... Sliding Scale 6)  Lantus 100 Unit/ml Soln (Insulin Glargine) .... Take 75 Units  Subcutaneously At Bedtime 7)  Multivitamins  Tabs (Multiple Vitamin) .... Take 1 Tablet By Mouth Once A Day 8)  Gemfibrozil 600 Mg Tabs (Gemfibrozil) .[name].. 1 Tab By Mouth Two Times A Day 9)  Amiodarone Hcl 200 Mg Tabs (Amiodarone Hcl) .... Take One Tablet By Mouth Once A Day 10)  Lyrica .... 2 Tabs By Mouth Bid 11)  Klor-Con M20 20 Meq Cr-Tabs (Potassium Chloride Crys Cr) .... One Tablet By Mouth Once Daily 12)  Lisinopril 10 Mg Tabs (Lisinopril) .... Take One Tablet By Mouth Daily 13)  Hydrocodone-Acetaminophen 10-325 Mg Tabs (Hydrocodone-Acetaminophen) .... One Tab Three Times A Day  Allergies (verified): No Known Drug Allergies  Past History:  Past Medical History: Reviewed history from 05/09/2009 and no changes required. Anemia Atrial Fibrillation Diabetes Type 1 Hyperlipidemia hypertension recurrent cellulitis morbid obesity acute renal insufficiency improved  Past Surgical History: Reviewed history from 01/06/2009 and no changes required. Tonsillectomy and adenoidectomy  stomach tumor removed at age 80 mo hx of perianal abscess, s/p drainage  Social History: Reviewed history from 01/06/2009 and no changes required. 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 Patient is a former smoker. -stopped 10 years ago Daily Caffeine Use-2 cups daily Illicit Drug Use - no Patient does not get regular exercise.   Review of Systems       Some problems with arthritis but no fevers or chills, productive cough, hemoptysis, dysphasia, odynophagia, melena, hematochezia, dysuria, hematuria, rash, seizure activity,  PND,  claudication. Remaining systems are negative.  Vital Signs:  Patient profile:   60 year old male Weight:      369 pounds Pulse rate:   78 / minute Pulse rhythm:   regular BP sitting:   151 / 66  (left arm) Cuff size:   large  Physical Exam  General:  Well-developed obese in no acute distress.  Skin  is warm and dry.  HEENT is normal.  Neck is supple. No thyromegaly.  Chest is clear to auscultation with normal expansion.  Cardiovascular exam is regular rate and rhythm.  Abdominal exam nontender or distended. No masses palpated. Extremities show 1+ edema. Chronic skin changes neuro grossly intact    EKG  Procedure date:  10/28/2009  Findings:      Sinus rhythm at a rate of 68. Axis normal. Infero-lateral T-wave inversion.  Impression & Recommendations:  Problem # 1:  COUMADIN THERAPY (ICD-V58.61) Monitored in the Coumadin clinic. Goal INR 2-Paul.  Problem # 2:  DIABETES MELLITUS-TYPE II (ICD-250.00)  Management per primary care. His updated medication list for this problem includes:    Glucovance 5-500 Mg Tabs (Glyburide-metformin) .[name]... 1 tab by mouth two times a day    Novolog 100 Unit/ml Soln (Insulin aspart) ..... Sliding scale    Lantus 100 Unit/ml Soln (Insulin glargine) .[name]... Take 75 units subcutaneously at bedtime    Lisinopril 20 Mg Tabs (Lisinopril) .[name]... Take one tablet by mouth daily  His updated medication list for this problem includes:    Glucovance 5-500 Mg Tabs (Glyburide-metformin) .[name]... 1 tab by mouth two times a day    Novolog 100 Unit/ml Soln (Insulin aspart) ..... Sliding scale    Lantus 100 Unit/ml Soln (Insulin glargine) .[name]... Take 75 units subcutaneously at bedtime    Lisinopril 20 Mg Tabs (Lisinopril) .[name]... Take one tablet by mouth daily  Problem # Paul:  THYROID STIMULATING HORMONE, ABNORMAL (ICD-246.9) Management per primary care.  Problem # 4:  ESSENTIAL HYPERTENSION, BENIGN (ICD-401.1)  Blood pressure elevated. Increase lisinopril to 20 mg p.o. daily. His updated medication list for this problem includes:    Furosemide 40 Mg Tabs (Furosemide) .[name]... Take 1 tab by mouth two times a day    Metoprolol Succinate 100 Mg Xr24h-tab (Metoprolol succinate) .[name]... 1 1/2 tab by mouth once daily    Lisinopril 20 Mg Tabs (Lisinopril) .[name]... Take one tablet by  mouth daily  His updated medication list for this problem includes:    Furosemide 40 Mg Tabs (Furosemide) .[name]... Take 1 tab by mouth two times a day    Metoprolol Succinate 100 Mg Xr24h-tab (Metoprolol succinate) .[name]... 1 1/2 tab by mouth once daily    Lisinopril 20 Mg Tabs (Lisinopril) .[name]... Take one tablet by mouth daily  Problem # 5:  ATRIAL FIBRILLATION (ICD-427.31) Patient remains in sinus rhythm. Continue amiodarone, beta blocker and Coumadin. Recent TSH and liver functions normal. Chest x-ray with question increased scarring. Repeat PA and lateral chest x-ray. His updated medication list for this problem includes:    Warfarin Sodium 5 Mg Tabs (Warfarin sodium) .[name]... Take as directed by coumadin clinic.    Metoprolol Succinate 100 Mg Xr24h-tab (Metoprolol succinate) .[name]... 1 1/2 tab by mouth once daily    Amiodarone Hcl 200 Mg Tabs (Amiodarone hcl) .[name]... Take one tablet by mouth once a day  Problem # 6:  CARDIOVASCULAR FUNCTION STUDY, ABNORMAL (ICD-794.30) Pt does not have chest pain. Continue present medications. May require cardiac catheterization in the future. Will add enteric-coated aspirin 81 mg p.o. daily.  Problem #  7:  HYPERLIPIDEMIA-MIXED (ICD-272.4) Continue present medications. Lipids and liver monitor by primary care. Given history of diabetes mellitus would consider adding statin but will leave to primary care. His updated medication list for this problem includes:    Gemfibrozil 600 Mg Tabs (Gemfibrozil) .[name]... 1 tab by mouth two times a day  His updated medication list for this problem includes:    Gemfibrozil 600 Mg Tabs (Gemfibrozil) .[name]... 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 He is volume overloaded. Increase Lasix to 40 mg p.o. b.i.d. and potassium to 20 meq p.o. b.i.d. Check bmet and BNP in one week. His updated medication list for  this problem includes:    Furosemide 40 Mg Tabs (Furosemide) .[name]... Take 1 tab by mouth two times a day    Warfarin Sodium 5 Mg Tabs (Warfarin sodium) .[name]... Take as directed by coumadin clinic.    Metoprolol Succinate 100 Mg Xr24h-tab (Metoprolol succinate) .[name]... 1 1/2 tab by mouth once daily    Amiodarone Hcl 200 Mg Tabs (Amiodarone hcl) .[name]... Take one tablet by mouth once a day    Lisinopril 20 Mg Tabs (Lisinopril) .[name]... Take one tablet by mouth daily  His updated medication list for this problem includes:    Furosemide 40 Mg Tabs (Furosemide) .[name]... Take 1 tab by mouth two times a day    Warfarin Sodium 5 Mg Tabs (Warfarin sodium) .[name]... Take as directed by coumadin clinic.    Metoprolol Succinate 100 Mg Xr24h-tab (Metoprolol succinate) .[name]... 1 1/2 tab by mouth once daily    Amiodarone Hcl 200 Mg Tabs (Amiodarone hcl) .[name]... Take one tablet by mouth once a day    Lisinopril 20 Mg Tabs (Lisinopril) .[name]... Take one tablet by mouth daily  Patient Instructions: 1)  Your physician recommends that you schedule a follow-up appointment in: Paul MONTHS 2)  Your physician recommends that you return for lab work in:ONE WEEK Paul)  Your physician has recommended you make the following change in your medication: INCREASE LISINOPRIL 20MG  ONCE DAILY 4)  INCREASE FUROSEMIDE 40MG  TWICE DAILY 5)  INCREASE KLOR-CON TWICE DAILY Prescriptions: LISINOPRIL 20 MG TABS (LISINOPRIL) Take one tablet by mouth daily  #30 x 12   Entered by:   Deliah Goody, RN   Authorized by:   Ferman Hamming, MD, [facility]   Signed by:   Deliah Goody, RN on 10/28/2009   Method used:   Electronically to        [facility] [phone]* (retail)       [address] [place]  [address]       Ph: [phone] or [phone]       Fax: [phone]   RxID:   [id number] KLOR-CON M20 20 MEQ CR-TABS (POTASSIUM CHLORIDE CRYS CR) one tablet by mouth two times a day  #60 x 12   Entered by:   Deliah Goody, RN   Authorized  by:   Ferman Hamming, MD, [facility]   Signed by:   Deliah Goody, RN on 10/28/2009   Method used:   Electronically to        [facility] [phone]* (retail)       [address]       [place], [place]  [address]       Ph: [phone] or [phone]  Fax: [phone]   RxID:   [id number] FUROSEMIDE 40 MG TABS (FUROSEMIDE) Take 1 tab by mouth two times a day  #60 x 12   Entered by:   Deliah Goody, RN   Authorized by:   Ferman Hamming, MD, [facility]   Signed by:   Deliah Goody, RN on 10/28/2009   Method used:   [facility]ron[facility] to        [facility] [phone]* (retail)       [address], [place]  [address]       Ph: [phone] or [phone]       Fax: [phone]   RxID:   [id number] KLOR-CON M20 20 MEQ CR-TABS (POTASSIUM CHLORIDE CRYS CR) one tablet by mouth two times a day  #60 x 12   Entered by:   Deliah Goody, RN   Authorized by:   Ferman Hamming, MD, [facility]   Signed by:   Deliah Goody, RN on 10/28/2009   Method used:   Faxed to ...       [facility] (retail)       [address] [place], [place]  [address]       Ph: [phone]       Fax: [phone]   RxID:   [id number] FUROSEMIDE 40 MG TABS (FUROSEMIDE) Take 1 tab by mouth two times a day  #60 x 12   Entered by:   Deliah Goody, RN   Authorized by:   Ferman Hamming, MD, [facility]   Signed by:   Deliah Goody, RN on 10/28/2009   Method used:   Faxed to ...       [facility] (retail)       [address] [place], [place]  [address]       Ph: [phone]       Fax: [phone]   RxID:   [id number] LISINOPRIL 20 MG TABS (LISINOPRIL) Take one tablet by mouth daily  #30 x 12   Entered by:   Deliah Goody, RN   Authorized by:   Ferman Hamming, MD, [facility]   Signed by:   Deliah Goody, RN on 10/28/2009   Method used:   Faxed to ...       [facility] (retail)       [address] [place], [place]  [address]       Ph: [phone]       Fax: [phone]   RxID:   [id number]

## 2010-08-11 NOTE — Medication Information (Signed)
Summary: rov/ewj  Anticoagulant Therapy  Managed by: Cloyde Reams, RN, BSN Referring MD: [name] MD, Arlys John PCP: Alfonse Ras, MD Supervising MD: [name] MD, Arlys John Indication 1: Atrial Fibrillation Indication 2:  - Lab Used: [facility] [place] Site: [address] INR POC 1.8 INR RANGE 2.0-3.0  Dietary changes: no    Health status changes: no    Bleeding/hemorrhagic complications: no    Recent/future hospitalizations: no    Any changes in medication regimen? no    Recent/future dental: no  Any missed doses?: no       Is patient compliant with meds? yes      Comments: Pt states he has been taking 1 tablet daily.  Allergies (verified): No Known Drug Allergies  Anticoagulation Management History:      The patient is taking warfarin and comes in today for a routine follow up visit.  Positive risk factors for bleeding include presence of serious comorbidities.  Negative risk factors for bleeding include an age less than 33 years old.  The bleeding index is 'intermediate risk'.  Positive CHADS2 values include History of HTN and History of Diabetes.  Negative CHADS2 values include Age > 33 years old.  Anticoagulation responsible provider: [name] MD, Arlys John.  INR POC: 1.8.  Cuvette Lot#: [id number].  Exp: 11/2010.    Anticoagulation Management Assessment/Plan:      The patient's current anticoagulation dose is Warfarin sodium 5 mg tabs: Take as directed by coumadin clinic..  The target INR is 2.0-3.0.  The next INR is due 09/25/2009.  Anticoagulation instructions were given to patient.  Results were reviewed/authorized by Cloyde Reams, RN, BSN.  He was notified by Cloyde Reams RN.         Prior Anticoagulation Instructions: INR 1.8 Today take extra 1/2 tablet then change dose to 1 tablet everyday except 1 1/2 tablets on Wednesdays. Recheck in 10 days.   Current Anticoagulation Instructions: INR 1.8  Take 1.5 tablets today then start taking 1 tablet daily except 1.5  tablets on Wednesdays.  Recheck in 2 weeks.

## 2010-08-11 NOTE — Medication Information (Signed)
Summary: rov/ez  Anticoagulant Therapy  Managed by: Cloyde Reams, RN, BSN PCP: Alfonse Ras, MD Supervising MD: Juanda Chance MD, Dene Nazir Indication 1: Atrial Fibrillation Indication 2:  - Lab Used: [facility] [place] Site: [address] INR POC 3.3 INR RANGE 2.0-3.0  Dietary changes: no    Health status changes: no    Bleeding/hemorrhagic complications: no    Recent/future hospitalizations: no    Any changes in medication regimen? yes       Details: Decr to Amiodarone 200mg  qd.  Recent/future dental: no  Any missed doses?: no       Is patient compliant with meds? yes       Allergies (verified): No Known Drug Allergies  Anticoagulation Management History:      The patient is taking warfarin and comes in today for a routine follow up visit.  Positive risk factors for bleeding include presence of serious comorbidities.  Negative risk factors for bleeding include an age less than 25 years old.  The bleeding index is 'intermediate risk'.  Positive CHADS2 values include History of HTN and History of Diabetes.  Negative CHADS2 values include Age > 53 years old.  Anticoagulation responsible provider: Juanda Chance MD, Smitty Cords.  INR POC: 3.3.  Cuvette Lot#: [id number].  Exp: 10/2010.    Anticoagulation Management Assessment/Plan:      The patient's current anticoagulation dose is Warfarin sodium 5 mg tabs: Take as directed by coumadin clinic..  The target INR is 2.0-3.0.  The next INR is due 08/28/2009.  Anticoagulation instructions were given to patient.  Results were reviewed/authorized by Cloyde Reams, RN, BSN.  He was notified by Cloyde Reams RN.         Prior Anticoagulation Instructions: INR 2.3 Continue with same dosage 5mg  daily except 7.5mg  on Tuesdays Recheck in 4 weeks  Current Anticoagulation Instructions: INR 3.3  take 1/2 tablet tomorrow then start taking 5mg  daily.  Recheck in 2 weeks.

## 2010-08-11 NOTE — Letter (Signed)
Summary: Appointment - Missed  [facility], Main Office  [address]   [place], [place] [address]   Phone: [phone]  Fax: [phone]     January 22, 2010 MRN: [id number]   [facility] [address] [place], [place]  [address]   Dear [name],  Our records indicate you missed your appointment on  01/16/2010  with  Dr. [name]  It is very important that we reach you to reschedule this appointment. We look forward to participating in your health care needs. Please contact us at the number listed above at your earliest convenience to reschedule this appointment.     Sincerely,   Lorne Skeens  [facility] Scheduling Team

## 2010-08-11 NOTE — Medication Information (Signed)
Summary: ccr/jss  Anticoagulant Therapy  Managed by: Bethena Midget, RN, BSN Referring MD: [name] MD, Arlys John PCP: Alfonse Ras, MD Supervising MD: Myrtis Ser MD, Tinnie Gens Indication 1: Atrial Fibrillation Indication 2:  - Lab Used: [facility] [place] Site: [address] INR POC 2.9 INR RANGE 2.0-3.0  Dietary changes: no    Health status changes: no    Bleeding/hemorrhagic complications: no    Recent/future hospitalizations: no    Any changes in medication regimen? yes       Details: Lisinopril added, and lasix changed to [place]  Recent/future dental: no  Any missed doses?: no       Is patient compliant with meds? yes       Current Medications (verified): 1)  Glucovance 5-500 Mg Tabs (Glyburide-Metformin) .... Take 2 Tabs Two Times A Day 2)  Warfarin Sodium 5 Mg Tabs (Warfarin Sodium) .... Take As Directed By Coumadin Clinic. 3)  Metoprolol Succinate 100 Mg Xr24h-Tab (Metoprolol Succinate) .[name].. 1 1/2 Tab By Mouth Once Daily 4)  Novolog 100 Unit/ml Soln (Insulin Aspart) .... Sliding Scale 5)  Lantus 100 Unit/ml Soln (Insulin Glargine) .... Take 75 Units Subcutaneously At Bedtime 6)  Multivitamins  Tabs (Multiple Vitamin) .... Take 1 Tablet By Mouth Once A Day 7)  Gemfibrozil 600 Mg Tabs (Gemfibrozil) .[name].. 1 Tab By Mouth Two Times A Day 8)  Amiodarone Hcl 200 Mg Tabs (Amiodarone Hcl) .... Take One Tablet By Mouth Once A Day 9)  Lyrica .... 2 Tabs By Mouth Bid 10)  Klor-Con M20 20 Meq Cr-Tabs (Potassium Chloride Crys Cr) .... One Tablet By Mouth Two Times A Day 11)  Lisinopril 20 Mg Tabs (Lisinopril) .... Take One Tablet By Mouth Daily 12)  Hydrocodone-Acetaminophen 10-325 Mg Tabs (Hydrocodone-Acetaminophen) .... One Tab Three Times A Day 13)  Aspir-Low 81 Mg Tbec (Aspirin) .[name].. 1 Tablet By Mouth Daily 14)  Crestor 10 Mg Tabs (Rosuvastatin Calcium) .... Take One Tablet By Mouth Daily. 15)  Torsemide 20 Mg Tabs (Torsemide) .... Take One Tablet By Mouth Daily. 16)   Kadian 30 Mg Xr24h-Cap (Morphine Sulfate) .... Take 1 Capsule Daily  Allergies (verified): No Known Drug Allergies  Anticoagulation Management History:      The patient is taking warfarin and comes in today for a routine follow up visit.  Positive risk factors for bleeding include presence of serious comorbidities.  Negative risk factors for bleeding include an age less than 72 years old.  The bleeding index is 'intermediate risk'.  Positive CHADS2 values include History of CHF, History of HTN, and History of Diabetes.  Negative CHADS2 values include Age > 54 years old.  Anticoagulation responsible provider: Myrtis Ser MD, Tinnie Gens.  INR POC: 2.9.  Cuvette Lot#: [id number].  Exp: 02/2011.    Anticoagulation Management Assessment/Plan:      The patient's current anticoagulation dose is Warfarin sodium 5 mg tabs: Take as directed by coumadin clinic..  The target INR is 2.0-3.0.  The next INR is due 01/22/2010.  Anticoagulation instructions were given to patient.  Results were reviewed/authorized by Bethena Midget, RN, BSN.  He was notified by Bethena Midget, RN, BSN.        Coagulation management information includes: Pt. Pending DCCV .  Prior Anticoagulation Instructions: INR 2.8  Continue same dose of 1 tablet every day except 1 1/2 tablets on Tuesday and Thursday   Current Anticoagulation Instructions: INR 2.9 Continue 5mg s everyday except 7.5mg s on Tuesdays and Thursdays. Recheck in 4 weeks.

## 2010-08-11 NOTE — Miscellaneous (Signed)
Summary: Orders Update  Clinical Lists Changes  Orders: Added new Test order of T-2 View CXR (71020TC) - Signed 

## 2010-08-11 NOTE — Progress Notes (Signed)
Summary: triage/right great toenail torn off  Phone Note Call from Patient   Caller: Patient Reason for Call: Talk to Nurse Summary of Call: Patient states he accidently torn his toe nail off his right great toe last night.Marland KitchenMarland KitchenHe states it isn't bleeding much not but is sore and painful.Marland KitchenMarland KitchenHe has a history of diabetes.Marland KitchenAppointment made in acute schedule this this afternoon. Initial call taken by: Conchita Paris,  October 29, 2009 11:40 AM

## 2010-08-11 NOTE — Medication Information (Signed)
Summary: Coumadin Clinic  Anticoagulant Therapy  Managed by: Bethena Midget, RN, BSN Referring MD: [name] MD, Arlys John PCP: Alfonse Ras, MD Supervising MD: Daleen Squibb MD, Maisie Fus Indication 1: Atrial Fibrillation Indication 2:  - Lab Used: [facility] [place] Site: [address] PT 59.0 INR POC 6.1 INR RANGE 2.0-3.0  Dietary changes: no    Health status changes: no    Bleeding/hemorrhagic complications: no    Recent/future hospitalizations: no    Any changes in medication regimen? no    Recent/future dental: no  Any missed doses?: no       Is patient compliant with meds? yes       Allergies: No Known Drug Allergies  Anticoagulation Management History:      His anticoagulation is being managed by telephone today.  Positive risk factors for bleeding include presence of serious comorbidities.  Negative risk factors for bleeding include an age less than 59 years old.  The bleeding index is 'intermediate risk'.  Positive CHADS2 values include History of CHF, History of HTN, and History of Diabetes.  Negative CHADS2 values include Age > 59 years old.  Prothrombin time is 61.9.  Anticoagulation responsible provider: Daleen Squibb MD, Maisie Fus.  INR POC: 6.1.    Anticoagulation Management Assessment/Plan:      The patient's current anticoagulation dose is Warfarin sodium 5 mg tabs: Take as directed by coumadin clinic..  The target INR is 2.0-3.0.  The next INR is due 04/06/2010.  Anticoagulation instructions were given to patient.  Results were reviewed/authorized by Bethena Midget, RN, BSN.  He was notified by Bethena Midget, RN, BSN.        Coagulation management information includes: new # [id number].  Prior Anticoagulation Instructions: INR 1.1  Pt took 2 tablets today, take 2 tablets tomorrow, then resume same dosage 1 tablet daily except 1.5 tablets on Tuesdays and Thursdays.  Recheck in 7-10 days.    Current Anticoagulation Instructions: INR 6.1 Pt holding from 9/21 to 9/23 then  resuming 5mg s daily except 7.5mg s on Tuesdays and Thursdays. Recheck on Monday in [place].

## 2010-08-11 NOTE — Medication Information (Signed)
Summary: [facility]v coumadin - [facility]  Anticoagulant Therapy  Managed by: Elaina Pattee, PharmD Referring MD: [name] MD, Arlys John PCP: Alfonse Ras, MD Supervising MD: Excell Seltzer MD, Casimiro Needle Indication 1: Atrial Fibrillation Indication 2:  - Lab Used: [facility] [place] Site: [address] INR POC 2.0 INR RANGE 2.0-3.0  Dietary changes: no    Health status changes: no    Bleeding/hemorrhagic complications: no    Recent/future hospitalizations: no    Any changes in medication regimen? yes       Details: Lisinopril 20 mg daily increased.  Norco 10/325 mg TID added. KCL 20 mEq BID increased.  Recent/future dental: no  Any missed doses?: no       Is patient compliant with meds? yes       Current Medications (verified): 1)  Furosemide 40 Mg Tabs (Furosemide) .... Take 1 Tab By Mouth Two Times A Day 2)  Glucovance 5-500 Mg Tabs (Glyburide-Metformin) .... 2 Tab By Mouth Two Times A Day 3)  Warfarin Sodium 5 Mg Tabs (Warfarin Sodium) .... Take As Directed By Coumadin Clinic. 4)  Metoprolol Succinate 100 Mg Xr24h-Tab (Metoprolol Succinate) .[name].. 1 1/2 Tab By Mouth Once Daily 5)  Novolog 100 Unit/ml Soln (Insulin Aspart) .... Sliding Scale 6)  Lantus 100 Unit/ml Soln (Insulin Glargine) .... Take 75 Units Subcutaneously At Bedtime 7)  Multivitamins  Tabs (Multiple Vitamin) .... Take 1 Tablet By Mouth Once A Day 8)  Gemfibrozil 600 Mg Tabs (Gemfibrozil) .[name].. 1 Tab By Mouth Two Times A Day 9)  Amiodarone Hcl 200 Mg Tabs (Amiodarone Hcl) .... Take One Tablet By Mouth Once A Day 10)  Lyrica .... 2 Tabs By Mouth Bid 11)  Klor-Con M20 20 Meq Cr-Tabs (Potassium Chloride Crys Cr) .... One Tablet By Mouth Two Times A Day 12)  Lisinopril 20 Mg Tabs (Lisinopril) .... Take One Tablet By Mouth Daily 13)  Hydrocodone-Acetaminophen 10-325 Mg Tabs (Hydrocodone-Acetaminophen) .... One Tab Three Times A Day 14)  Aspir-Low 81 Mg Tbec (Aspirin) .[name].. 1 Tablet By Mouth Daily  Allergies (verified): No Known  Drug Allergies  Anticoagulation Management History:      The patient is taking warfarin and comes in today for a routine follow up visit.  Positive risk factors for bleeding include presence of serious comorbidities.  Negative risk factors for bleeding include an age less than 63 years old.  The bleeding index is 'intermediate risk'.  Positive CHADS2 values include History of CHF, History of HTN, and History of Diabetes.  Negative CHADS2 values include Age > 61 years old.  Anticoagulation responsible provider: Excell Seltzer MD, Casimiro Needle.  INR POC: 2.0.  Cuvette Lot#: [id number].  Exp: 12/2010.    Anticoagulation Management Assessment/Plan:      The patient's current anticoagulation dose is Warfarin sodium 5 mg tabs: Take as directed by coumadin clinic..  The target INR is 2.0-3.0.  The next INR is due 11/18/2009.  Anticoagulation instructions were given to patient.  Results were reviewed/authorized by Elaina Pattee, PharmD.  He was notified by Elaina Pattee, PharmD.         Prior Anticoagulation Instructions: INR 1.8  Coumadin 1 and 1/2 tab = 7.5mg  on Thur 1 tab = 5mg  all other days  Current Anticoagulation Instructions: INR 2.0. Take 1 tablet daily except 1.5 tablets Tues and Thurs.

## 2010-08-11 NOTE — Progress Notes (Signed)
Summary: Overdue for follow-up/Out of town    Caller: Patient Call For: Coumadin Clinic Summary of Call: Pt is out of town in [place], pt states he is still taking Coumadin on the same dosage as previously rx.  Pt will be out of town x 2 months, advised pt he needs to have Coumadin checked ASAP.  Pt is going to get the fax # for a lab near him in [place] for [place] to fax a written order to check PT/INR and send results to [place] to dose.  Pt to call us back, he is aware it is dangerous to take Coumadin without being appropriately monitored. Initial call taken by: Cloyde Reams RN,  March 17, 2010 3:56 PM  Follow-up for Phone Call        [facility] for sister that pt has not called [place] back with info in [place] for lab so that he could have his INR checked. Asked if she could call him to remind him to call us.  Follow-up by: Bethena Midget, RN, BSN,  March 31, 2010 11:23 AM  Additional Follow-up for Phone Call Additional follow up Details #1::        Pt telephoned [place] and [facility] with his new # [phone]. Attempted to reach him at new # but had to [facility].  Additional Follow-up by: Bethena Midget, RN, BSN,  March 31, 2010 1:45 PM    Additional Follow-up for Phone Call Additional follow up Details #2::    Pt states he got blood draw yesterday and it was 6.0 and was instructed today  to hold for 3 days since he hadn't heard from [place], I told pt that we haven't received lab report. He states that they had on file our fax # from last year and was faxing the report. He stated he was going to call to find out if they had faxed [place] the report. He states he will repeat INR on Monday.  Follow-up by: Bethena Midget, RN, BSN,  April 01, 2010 3:34 PM  Additional Follow-up for Phone Call Additional follow up Details #3:: Details for Additional Follow-up Action Taken: Reached pt today because we had not seen the INR results. He states he will call them to have it faxed. Bethena Midget, RN, BSN  April 02, 2010 12:21 PM Faxed received from [facility]. See coumacare sheet.  Additional Follow-up by: Bethena Midget, RN, BSN,  April 02, 2010 12:22 PM

## 2010-08-11 NOTE — Medication Information (Signed)
Summary: Coumadin Clinic  Anticoagulant Therapy  Managed by: Cloyde Reams, RN, BSN Referring MD: [name] MD, Arlys John PCP: Alfonse Ras, MD Supervising MD: Excell Seltzer MD, Casimiro Needle Indication 1: Atrial Fibrillation Indication 2:  - Lab Used: [facility] [place] Site: [employer] PT 13.8 INR POC 1.3 INR RANGE 2.0-3.0  Dietary changes: no    Health status changes: no    Bleeding/hemorrhagic complications: no    Recent/future hospitalizations: no    Any changes in medication regimen? yes       Details: Off 30mg  Morphine ER, 15mg  oxycodone currently  Recent/future dental: no  Any missed doses?: yes     Details: Pt held x 5 days instead of 3 days, just restarted Coumadin today.  Is patient compliant with meds? yes       Allergies: No Known Drug Allergies  Anticoagulation Management History:      His anticoagulation is being managed by telephone today.  Positive risk factors for bleeding include presence of serious comorbidities.  Negative risk factors for bleeding include an age less than 63 years old.  The bleeding index is 'intermediate risk'.  Positive CHADS2 values include History of CHF, History of HTN, and History of Diabetes.  Negative CHADS2 values include Age > 11 years old.  Prothrombin time is 13.8.  Anticoagulation responsible Aalijah Lanphere: Excell Seltzer MD, Casimiro Needle.  INR POC: 1.3.  Exp: 04/2011.    Anticoagulation Management Assessment/Plan:      The patient's current anticoagulation dose is Warfarin sodium 5 mg tabs: Take as directed by coumadin clinic..  The target INR is 2.0-3.0.  The next INR is due 04/13/2010.  Anticoagulation instructions were given to patient.  Results were reviewed/authorized by Cloyde Reams, RN, BSN.  He was notified by Cloyde Reams RN.         Prior Anticoagulation Instructions: INR 6.1 Pt holding from 9/21 to 9/23 then resuming 5mg s daily except 7.5mg s on Tuesdays and Thursdays. Recheck on Monday in [place].   Current Anticoagulation  Instructions: INR 1.3  Take 10mg  today, then resume same dosage 5mg  daily except 7.5mg  on Tuesdays and Thursdays.   Recheck in 1 week.  Written order sent to [facility] in [place].

## 2010-08-11 NOTE — Medication Information (Signed)
Summary: rov/ewj  Anticoagulant Therapy  Managed by: Shelby Dubin, Pharm D PCP: Alfonse Ras, MD Supervising MD: Shirlee Latch MD, Freida Busman Indication 1: Atrial Fibrillation Indication 2:  - Lab Used: [facility] [place] Site: [address] INR POC 2.3 INR RANGE 2.0-3.0  Dietary changes: no    Health status changes: no    Bleeding/hemorrhagic complications: no    Recent/future hospitalizations: no    Any changes in medication regimen? no    Recent/future dental: no  Any missed doses?: no       Is patient compliant with meds? yes       Allergies (verified): No Known Drug Allergies  Anticoagulation Management History:      The patient is taking warfarin and comes in today for a routine follow up visit.  Positive risk factors for bleeding include presence of serious comorbidities.  Negative risk factors for bleeding include an age less than 27 years old.  The bleeding index is 'intermediate risk'.  Positive CHADS2 values include History of HTN and History of Diabetes.  Negative CHADS2 values include Age > 32 years old.  Anticoagulation responsible provider: Shirlee Latch MD, Tashara Suder.  INR POC: 2.3.  Cuvette Lot#: [id number].  Exp: 08/2010.    Anticoagulation Management Assessment/Plan:      The patient's current anticoagulation dose is Warfarin sodium 5 mg tabs: Take as directed by coumadin clinic..  The target INR is 2.0-3.0.  The next INR is due 08/13/2009.  Anticoagulation instructions were given to patient.  Results were reviewed/authorized by Shelby Dubin, Pharm D.  He was notified by Ysidro Evert, Pham D Candidate.         Prior Anticoagulation Instructions: INR 3.0  Start taking 1 tablet daily except 1.5 tablets on Tuesdays.  Recheck in 2 weeks.     Current Anticoagulation Instructions: INR 2.3 Continue with same dosage 5mg  daily except 7.5mg  on Tuesdays Recheck in 4 weeks

## 2010-08-11 NOTE — Progress Notes (Signed)
  Phone Note Outgoing Call   Call placed by: Bethena Midget, RN, BSN,  February 24, 2010 4:00 PM Details for Reason: Past CVRR Summary of Call: Spoke with pt sister, pt has new phone # she will give him a message to call us for an appt. She states he is in [place] and left only a few days ago. Initial call taken by: Bethena Midget, RN, BSN,  February 24, 2010 4:01 PM

## 2010-08-11 NOTE — Medication Information (Signed)
Summary: ccr  Anticoagulant Therapy  Managed by: Cloyde Reams, RN, BSN Referring MD: [name] MD, Arlys John PCP: Alfonse Ras, MD Supervising MD: Juanda Chance MD, Artelia Game Indication 1: Atrial Fibrillation Indication 2:  - Lab Used: [facility] [place] Site: [address] INR POC 1.1 INR RANGE 2.0-3.0  Dietary changes: no    Health status changes: no    Bleeding/hemorrhagic complications: no    Recent/future hospitalizations: no    Any changes in medication regimen? no    Recent/future dental: no  Any missed doses?: yes     Details: Missed 5 doses of coumadin, past 5 days.   Is patient compliant with meds? yes       Allergies: No Known Drug Allergies  Anticoagulation Management History:      The patient is taking warfarin and comes in today for a routine follow up visit.  Positive risk factors for bleeding include presence of serious comorbidities.  Negative risk factors for bleeding include an age less than 13 years old.  The bleeding index is 'intermediate risk'.  Positive CHADS2 values include History of CHF, History of HTN, and History of Diabetes.  Negative CHADS2 values include Age > 76 years old.  Anticoagulation responsible provider: Juanda Chance MD, Smitty Cords.  INR POC: 1.1.  Cuvette Lot#: [id number].  Exp: 04/2011.    Anticoagulation Management Assessment/Plan:      The patient's current anticoagulation dose is Warfarin sodium 5 mg tabs: Take as directed by coumadin clinic..  The target INR is 2.0-3.0.  The next INR is due 02/11/2010.  Anticoagulation instructions were given to patient.  Results were reviewed/authorized by Cloyde Reams, RN, BSN.  He was notified by Cloyde Reams RN.         Prior Anticoagulation Instructions: INR 2.9 Continue 5mg s everyday except 7.5mg s on Tuesdays and Thursdays. Recheck in 4 weeks.   Current Anticoagulation Instructions: INR 1.1  Pt took 2 tablets today, take 2 tablets tomorrow, then resume same dosage 1 tablet daily except 1.5 tablets on  Tuesdays and Thursdays.  Recheck in 7-10 days.

## 2010-08-11 NOTE — Medication Information (Signed)
Summary: Coumadin Clinic  Anticoagulant Therapy  Managed by: Inactive Referring MD: [name] MD, Arlys John PCP: Alfonse Ras, MD Supervising MD: Excell Seltzer MD, Casimiro Needle Indication 1: Atrial Fibrillation Indication 2:  - Lab Used: [facility] [place] Site: [address] INR RANGE 2.0-3.0          Comments: Pt is in [place] living and working.  [facility] [place] [phone] is going to follow while pt there. Cloyde Reams RN  April 06, 2010 2:42 PM   Allergies: No Known Drug Allergies  Anticoagulation Management History:      Positive risk factors for bleeding include presence of serious comorbidities.  Negative risk factors for bleeding include an age less than 65 years old.  The bleeding index is 'intermediate risk'.  Positive CHADS2 values include History of CHF, History of HTN, and History of Diabetes.  Negative CHADS2 values include Age > 64 years old.  Anticoagulation responsible Aryel Edelen: Excell Seltzer MD, Casimiro Needle.  Exp: 04/2011.    Anticoagulation Management Assessment/Plan:      The patient's current anticoagulation dose is Warfarin sodium 5 mg tabs: Take as directed by coumadin clinic..  The target INR is 2.0-3.0.  The next INR is due 04/13/2010.  Anticoagulation instructions were given to patient.  Results were reviewed/authorized by Inactive.         Prior Anticoagulation Instructions: INR 1.3  Take 10mg  today, then resume same dosage 5mg  daily except 7.5mg  on Tuesdays and Thursdays.   Recheck in 1 week.  Written order sent to [facility] in [place].

## 2010-08-11 NOTE — Medication Information (Signed)
Summary: rov/ewj  Anticoagulant Therapy  Managed by: Leota Sauers, PharmD, BCPS, CPP Referring MD: [name] MD, Arlys John PCP: Alfonse Ras, MD Supervising MD: Ladona Ridgel MD, Sharlot Gowda Indication 1: Atrial Fibrillation Indication 2:  - Lab Used: [facility] [place] Site: [address] INR POC 1.8 INR RANGE 2.0-3.0  Dietary changes: no    Health status changes: no    Bleeding/hemorrhagic complications: no    Recent/future hospitalizations: no    Any changes in medication regimen? no    Recent/future dental: no  Any missed doses?: no       Is patient compliant with meds? yes       Current Medications (verified): 1)  Furosemide 40 Mg Tabs (Furosemide) .... Take 1 Tab By Mouth Once Daily 2)  Glucovance 5-500 Mg Tabs (Glyburide-Metformin) .[name].. 1 Tab By Mouth Two Times A Day 3)  Warfarin Sodium 5 Mg Tabs (Warfarin Sodium) .... Take As Directed By Coumadin Clinic. 4)  Metoprolol Succinate 100 Mg Xr24h-Tab (Metoprolol Succinate) .[name].. 1 1/2 Tab By Mouth Once Daily 5)  Novolog 100 Unit/ml Soln (Insulin Aspart) .... Sliding Scale 6)  Lantus 100 Unit/ml Soln (Insulin Glargine) .... Take 75 Units Subcutaneously At Bedtime 7)  Multivitamins  Tabs (Multiple Vitamin) .... Take 1 Tablet By Mouth Once A Day 8)  Gemfibrozil 600 Mg Tabs (Gemfibrozil) .[name].. 1 Tab By Mouth Two Times A Day 9)  Amiodarone Hcl 200 Mg Tabs (Amiodarone Hcl) .... Take One Tablet By Mouth Once A Day 10)  Lyrica .... 2 Tabs By Mouth Bid  Allergies (verified): No Known Drug Allergies  Anticoagulation Management History:      The patient is taking warfarin and comes in today for a routine follow up visit.  Positive risk factors for bleeding include presence of serious comorbidities.  Negative risk factors for bleeding include an age less than 66 years old.  The bleeding index is 'intermediate risk'.  Positive CHADS2 values include History of HTN and History of Diabetes.  Negative CHADS2 values include Age > 71 years old.   Anticoagulation responsible provider: Ladona Ridgel MD, Sharlot Gowda.  INR POC: 1.8.  Cuvette Lot#: [id number].  Exp: 11/2010.    Anticoagulation Management Assessment/Plan:      The patient's current anticoagulation dose is Warfarin sodium 5 mg tabs: Take as directed by coumadin clinic..  The target INR is 2.0-3.0.  The next INR is due 10/23/2009.  Anticoagulation instructions were given to patient.  Results were reviewed/authorized by Leota Sauers, PharmD, BCPS, CPP.         Prior Anticoagulation Instructions: INR 1.8  Take 1.5 tablets today then start taking 1 tablet daily except 1.5 tablets on Wednesdays.  Recheck in 2 weeks.      Current Anticoagulation Instructions: INR 1.8  Coumadin 1 and 1/2 tab = 7.5mg  on Thur 1 tab = 5mg  all other days

## 2010-08-11 NOTE — Progress Notes (Signed)
  Walk in Patient Form Recieved " Pt. left Continuance of Diability papers forwarded to [facility]  August 04, 2009 8:46 AM

## 2010-08-11 NOTE — Miscellaneous (Signed)
Clinical Lists Changes  Observations: Added new observation of ECHOINTERP:  Study Conclusions    1. Left ventricle: Systolic function was normal. The estimated      ejection fraction was in the range of 55% to 60%. Wall motion was      normal; there were no regional wall motion abnormalities.   2. Aortic valve: No evidence of vegetation.   3. Mitral valve: Mild regurgitation.   4. Left atrium: The atrium was moderately dilated. Emptying velocity      was reduced.   5. Right atrium: The atrium was mildly dilated.   6. Atrial septum: No defect or patent foramen ovale was identified.   7. Tricuspid valve: No evidence of vegetation.   Impressions:    - Possible left atrial appendage thrombus; suggest 4 weeks of     therapeutic anticoagulation and then proceed with cardioversion.   Transesophageal echocardiography. 2D and color Doppler.    Left ventricle: Systolic function was normal. The estimated ejection   fraction was in the range of 55% to 60%. Wall motion was normal;   there were no regional wall motion abnormalities.   Aortic valve: Structurally normal valve. Cusp separation was normal.   No evidence of vegetation. Doppler: No regurgitation.   Aorta: Descending aorta: The descending aorta had mild diffuse   atherosclerotic disease.   Mitral valve: Mildly thickened leaflets . Doppler: Mild   regurgitation.   Left atrium: The atrium was moderately dilated. Emptying velocity   was reduced.   Atrial septum: No defect or patent foramen ovale was identified.   Right ventricle: The cavity size was normal. Wall thickness was   normal. Systolic function was normal.   Pulmonic valve:  Doppler: Trivial regurgitation.   Tricuspid valve: Structurally normal valve. Leaflet separation was   normal. No evidence of vegetation. Doppler: Mild regurgitation.   Right atrium: The atrium was mildly dilated.   Pericardium: There was no pericardial effusion.    Prepared and   Electronically  Authenticated by  Olga Millers, MD, [facility]   2010-11-05T15:01:22.847 (05/16/2009 14:05) Added new observation of ECHOINTERP:   Study Conclusions    1. Left ventricle: The cavity size was normal. Wall thickness was      increased in a pattern of mild LVH. Systolic function was normal.      The estimated ejection fraction was in the range of 55% to 60%.   2. Left atrium: The atrium was mildly dilated.  Left ventricle: The cavity size was normal. Wall thickness was   increased in a pattern of mild LVH. Systolic function was normal.   The estimated ejection fraction was in the range of 55% to 60%.   Images were inadequate for LV wall motion assessment.   Aortic valve: Trileaflet; normal thickness leaflets. Mobility was   not restricted. Doppler: Transvalvular velocity was within the   normal range. There was no stenosis. No regurgitation.   Aorta: Aortic root: The aortic root was mildly dilated.   Mitral valve: Structurally normal valve. Doppler: Transvalvular   velocity was within the normal range. There was no evidence for   stenosis. No regurgitation.  Peak gradient: 6mm Hg (D).   Left atrium: The atrium was mildly dilated.   Right ventricle: The cavity size was normal. Systolic function was   normal.   Pulmonic valve:  Doppler: Transvalvular velocity was within the   normal range. There was no evidence for stenosis.   Tricuspid valve:  Doppler: Transvalvular velocity was within the  normal range. No regurgitation.   Right atrium: The atrium was normal in size.   Pericardium: There was no pericardial effusion.   Systemic veins:   Inferior vena cava: The vessel was normal in size.  Prepared and Electronically Authenticated by    Olga Millers, MD, [facility]   2010-05-10T13:33:08.247  Additional Information  HL7 RESULT STATUS : F  External IF Update Timestamp : 2008-11-18:13:33:00.000000  (11/18/2008 14:06)      Echocardiogram  Procedure date:  05/16/2009  Findings:        Study Conclusions    1. Left ventricle: Systolic function was normal. The estimated      ejection fraction was in the range of 55% to 60%. Wall motion was      normal; there were no regional wall motion abnormalities.   2. Aortic valve: No evidence of vegetation.   3. Mitral valve: Mild regurgitation.   4. Left atrium: The atrium was moderately dilated. Emptying velocity      was reduced.   5. Right atrium: The atrium was mildly dilated.   6. Atrial septum: No defect or patent foramen ovale was identified.   7. Tricuspid valve: No evidence of vegetation.   Impressions:    - Possible left atrial appendage thrombus; suggest 4 weeks of     therapeutic anticoagulation and then proceed with cardioversion.   Transesophageal echocardiography. 2D and color Doppler.    Left ventricle: Systolic function was normal. The estimated ejection   fraction was in the range of 55% to 60%. Wall motion was normal;   there were no regional wall motion abnormalities.   Aortic valve: Structurally normal valve. Cusp separation was normal.   No evidence of vegetation. Doppler: No regurgitation.   Aorta: Descending aorta: The descending aorta had mild diffuse   atherosclerotic disease.   Mitral valve: Mildly thickened leaflets . Doppler: Mild   regurgitation.   Left atrium: The atrium was moderately dilated. Emptying velocity   was reduced.   Atrial septum: No defect or patent foramen ovale was identified.   Right ventricle: The cavity size was normal. Wall thickness was   normal. Systolic function was normal.   Pulmonic valve:  Doppler: Trivial regurgitation.   Tricuspid valve: Structurally normal valve. Leaflet separation was   normal. No evidence of vegetation. Doppler: Mild regurgitation.   Right atrium: The atrium was mildly dilated.   Pericardium: There was no pericardial effusion.    Prepared and   Electronically Authenticated by  Olga Millers, MD, [facility]    2010-11-05T15:01:22.847  Echocardiogram  Procedure date:  11/18/2008  Findings:        Study Conclusions    1. Left ventricle: The cavity size was normal. Wall thickness was      increased in a pattern of mild LVH. Systolic function was normal.      The estimated ejection fraction was in the range of 55% to 60%.   2. Left atrium: The atrium was mildly dilated.  Left ventricle: The cavity size was normal. Wall thickness was   increased in a pattern of mild LVH. Systolic function was normal.   The estimated ejection fraction was in the range of 55% to 60%.   Images were inadequate for LV wall motion assessment.   Aortic valve: Trileaflet; normal thickness leaflets. Mobility was   not restricted. Doppler: Transvalvular velocity was within the   normal range. There was no stenosis. No regurgitation.   Aorta: Aortic root: The aortic root was mildly dilated.   Mitral  valve: Structurally normal valve. Doppler: Transvalvular   velocity was within the normal range. There was no evidence for   stenosis. No regurgitation.  Peak gradient: 6mm Hg (D).   Left atrium: The atrium was mildly dilated.   Right ventricle: The cavity size was normal. Systolic function was   normal.   Pulmonic valve:  Doppler: Transvalvular velocity was within the   normal range. There was no evidence for stenosis.   Tricuspid valve:  Doppler: Transvalvular velocity was within the   normal range. No regurgitation.   Right atrium: The atrium was normal in size.   Pericardium: There was no pericardial effusion.   Systemic veins:   Inferior vena cava: The vessel was normal in size.  Prepared and Electronically Authenticated by    Olga Millers, MD, [facility]   2010-05-10T13:33:08.247  Additional Information  HL7 RESULT STATUS : F  External IF Update Timestamp : 2008-11-18:13:33:00.000000

## 2010-08-11 NOTE — Letter (Signed)
Summary: *Medication Reconciliation Letter  All     ,     Phone:   Fax:      10/28/2009 MRN: [id number]  [name] [address] [place], [place]  [address]  Dear [name],  We are glad to see you again today!  Please take a moment to review and update your medication list. You can give this paper to the nurse when she calls you back for your appointment. Please include the over the counter medicines that you are currently taking.  Medication List:  FUROSEMIDE 40 MG TABS (FUROSEMIDE) Take 1 tab by mouth once daily GLUCOVANCE 5-500 MG TABS (GLYBURIDE-METFORMIN) 1 tab by mouth two times a day WARFARIN SODIUM 5 MG TABS (WARFARIN SODIUM) Take as directed by coumadin clinic. METOPROLOL SUCCINATE 100 MG XR24H-TAB (METOPROLOL SUCCINATE) 1 1/2 tab by mouth once daily NOVOLOG 100 UNIT/ML SOLN (INSULIN ASPART) Sliding Scale LANTUS 100 UNIT/ML SOLN (INSULIN GLARGINE) Take 75 units subcutaneously at bedtime MULTIVITAMINS  TABS (MULTIPLE VITAMIN) Take 1 tablet by mouth once a day GEMFIBROZIL 600 MG TABS (GEMFIBROZIL) 1 tab by mouth two times a day AMIODARONE HCL 200 MG TABS (AMIODARONE HCL) Take one tablet by mouth once a day * LYRICA 2 tabs by mouth bid KLOR-CON M20 20 MEQ CR-TABS (POTASSIUM CHLORIDE CRYS CR) one tablet by mouth once daily    Medication allergies:______________________________________________  Name of your current Pharmacy and location:_____________________________  ____________________________________________    Thank you

## 2010-08-19 NOTE — Progress Notes (Signed)
Summary: can pt can from coumadin to praxada  Phone Note Call from Patient Call back at Home Phone [phone]   Caller: Patient Reason for Call: Talk to Nurse Summary of Call:  pt move out of town, pcp is asking him can he changed from  coumadin to pradaxa.  Initial call taken by: Lorne Skeens,  August 06, 2010 10:12 AM  Follow-up for Phone Call        Left message to call back, pt last seen in 10/2009. will need appt to discuss if planning on coming back here Deliah Goody, RN  August 06, 2010 11:30 AM  spoke with pt, he is currently living at [facility] and his primary is setting him up with a new cardiologist. the decision will be made by the new cardiologist Deliah Goody, RN  August 12, 2010 8:26 AM

## 2010-10-13 ENCOUNTER — Telehealth: Payer: Self-pay | Admitting: Cardiology

## 2010-10-13 LAB — CBC
Hemoglobin: 11.6 g/dL — ABNORMAL LOW (ref 13.0–17.0)
RDW: 17.4 % — ABNORMAL HIGH (ref 11.5–15.5)

## 2010-10-13 LAB — BASIC METABOLIC PANEL
Calcium: 9 mg/dL (ref 8.4–10.5)
Creatinine, Ser: 1.02 mg/dL (ref 0.4–1.5)
GFR calc Af Amer: >60 mL/min (ref >60)
GFR calc non Af Amer: >60 mL/min (ref >60)
Glucose, Bld: 121 mg/dL — ABNORMAL HIGH (ref 70–99)
Sodium: 139 mEq/L (ref 135–145)

## 2010-10-13 LAB — PROTIME-INR
INR: 3.65 — ABNORMAL HIGH (ref 0.00–1.49)
Prothrombin Time: 36 seconds — ABNORMAL HIGH (ref 11.6–15.2)

## 2010-10-13 LAB — GLUCOSE, CAPILLARY: Glucose-Capillary: 130 mg/dL — ABNORMAL HIGH (ref 70–99)

## 2010-10-13 MED ORDER — FUROSEMIDE 40 MG PO TABS: 40.0000 mg | ORAL_TABLET | Freq: Two times a day (BID) | ORAL | Status: DC

## 2010-10-13 MED ORDER — POTASSIUM CHLORIDE CRYS ER 20 MEQ PO TBCR: 20.0000 meq | EXTENDED_RELEASE_TABLET | Freq: Two times a day (BID) | ORAL | Status: DC

## 2010-10-13 MED ORDER — AMIODARONE HCL 200 MG PO TABS: 200.0000 mg | ORAL_TABLET | Freq: Every day | ORAL | Status: DC

## 2010-10-13 NOTE — Telephone Encounter (Signed)
Refill for pt that has moved refills expired /Lasix 40 mg, khlor kon , amioderone 200mg , and metforman 5/500mg  [facility] [employer] [facility] [phone]

## 2010-10-13 NOTE — Telephone Encounter (Signed)
Gave pt refills did not refill the metformin because it needs to be filled by his primary cardiologist

## 2010-10-20 LAB — POCT I-STAT, CHEM 8
BUN: 33 mg/dL — ABNORMAL HIGH (ref 6–23)
Creatinine, Ser: 2.1 mg/dL — ABNORMAL HIGH (ref 0.4–1.5)
Potassium: 5.9 mEq/L — ABNORMAL HIGH (ref 3.5–5.1)
Sodium: 132 mEq/L — ABNORMAL LOW (ref 135–145)

## 2010-10-20 LAB — BASIC METABOLIC PANEL
BUN: 12 mg/dL (ref 6–23)
BUN: 13 mg/dL (ref 6–23)
CO2: 23 mEq/L (ref 19–32)
CO2: 28 mEq/L (ref 19–32)
CO2: 28 mEq/L (ref 19–32)
Calcium: 8.9 mg/dL (ref 8.4–10.5)
Calcium: 8.9 mg/dL (ref 8.4–10.5)
Chloride: 103 mEq/L (ref 96–112)
Chloride: 103 mEq/L (ref 96–112)
Chloride: 104 mEq/L (ref 96–112)
Creatinine, Ser: 0.95 mg/dL (ref 0.4–1.5)
Creatinine, Ser: 0.98 mg/dL (ref 0.4–1.5)
GFR calc Af Amer: >60 mL/min (ref >60)
GFR calc Af Amer: >60 mL/min (ref >60)
GFR calc Af Amer: >60 mL/min (ref >60)
GFR calc non Af Amer: >60 mL/min (ref >60)
GFR calc non Af Amer: >60 mL/min (ref >60)
Glucose, Bld: 216 mg/dL — ABNORMAL HIGH (ref 70–99)
Glucose, Bld: 91 mg/dL (ref 70–99)
Potassium: 3.6 mEq/L (ref 3.5–5.1)
Potassium: 3.7 mEq/L (ref 3.5–5.1)
Potassium: 3.9 mEq/L (ref 3.5–5.1)
Sodium: 136 mEq/L (ref 135–145)
Sodium: 137 mEq/L (ref 135–145)
Sodium: 137 mEq/L (ref 135–145)
Sodium: 139 mEq/L (ref 135–145)

## 2010-10-20 LAB — DIFFERENTIAL
Basophils Absolute: 0.2 10*3/uL — ABNORMAL HIGH (ref 0.0–0.1)
Basophils Relative: 1 % (ref 0–1)
Eosinophils Absolute: 0.1 10*3/uL (ref 0.0–0.7)
Eosinophils Relative: 0 % (ref 0–5)
Monocytes Absolute: 0.7 10*3/uL (ref 0.1–1.0)
Monocytes Relative: 4 % (ref 3–12)
Neutro Abs: 13.7 10*3/uL — ABNORMAL HIGH (ref 1.7–7.7)

## 2010-10-20 LAB — CBC
HCT: 28.6 % — ABNORMAL LOW (ref 39.0–52.0)
HCT: 29.1 % — ABNORMAL LOW (ref 39.0–52.0)
HCT: 29.6 % — ABNORMAL LOW (ref 39.0–52.0)
HCT: 29.8 % — ABNORMAL LOW (ref 39.0–52.0)
HCT: 30 % — ABNORMAL LOW (ref 39.0–52.0)
Hemoglobin: 10 g/dL — ABNORMAL LOW (ref 13.0–17.0)
Hemoglobin: 10.1 g/dL — ABNORMAL LOW (ref 13.0–17.0)
Hemoglobin: 10.3 g/dL — ABNORMAL LOW (ref 13.0–17.0)
Hemoglobin: 10.3 g/dL — ABNORMAL LOW (ref 13.0–17.0)
Hemoglobin: 11.5 g/dL — ABNORMAL LOW (ref 13.0–17.0)
Hemoglobin: 9.8 g/dL — ABNORMAL LOW (ref 13.0–17.0)
MCHC: 33.4 g/dL (ref 30.0–36.0)
MCHC: 34.2 g/dL (ref 30.0–36.0)
MCV: 75.8 fL — ABNORMAL LOW (ref 78.0–100.0)
MCV: 76.1 fL — ABNORMAL LOW (ref 78.0–100.0)
Platelets: 211 10*3/uL (ref 150–400)
Platelets: 214 10*3/uL (ref 150–400)
Platelets: 216 10*3/uL (ref 150–400)
Platelets: 311 10*3/uL (ref 150–400)
RBC: 3.72 MIL/uL — ABNORMAL LOW (ref 4.22–5.81)
RBC: 3.82 MIL/uL — ABNORMAL LOW (ref 4.22–5.81)
RBC: 3.9 MIL/uL — ABNORMAL LOW (ref 4.22–5.81)
RBC: 3.95 MIL/uL — ABNORMAL LOW (ref 4.22–5.81)
RBC: 4.46 MIL/uL (ref 4.22–5.81)
RBC: 4.82 MIL/uL (ref 4.22–5.81)
RDW: 18 % — ABNORMAL HIGH (ref 11.5–15.5)
RDW: 18.3 % — ABNORMAL HIGH (ref 11.5–15.5)
RDW: 18.9 % — ABNORMAL HIGH (ref 11.5–15.5)
RDW: 18.9 % — ABNORMAL HIGH (ref 11.5–15.5)
WBC: 12.6 10*3/uL — ABNORMAL HIGH (ref 4.0–10.5)
WBC: 13.1 10*3/uL — ABNORMAL HIGH (ref 4.0–10.5)
WBC: 13.1 10*3/uL — ABNORMAL HIGH (ref 4.0–10.5)
WBC: 14.1 10*3/uL — ABNORMAL HIGH (ref 4.0–10.5)

## 2010-10-20 LAB — COMPREHENSIVE METABOLIC PANEL
AST: 23 U/L (ref 0–37)
Albumin: 3.2 g/dL — ABNORMAL LOW (ref 3.5–5.2)
BUN: 25 mg/dL — ABNORMAL HIGH (ref 6–23)
Chloride: 102 mEq/L (ref 96–112)
Creatinine, Ser: 1.44 mg/dL (ref 0.4–1.5)
GFR calc Af Amer: >60 mL/min (ref >60)
Potassium: 4.5 mEq/L (ref 3.5–5.1)
Total Bilirubin: 0.6 mg/dL (ref 0.3–1.2)
Total Protein: 6.9 g/dL (ref 6.0–8.3)

## 2010-10-20 LAB — PROTIME-INR
INR: 1.6 — ABNORMAL HIGH (ref 0.00–1.49)
INR: 1.9 — ABNORMAL HIGH (ref 0.00–1.49)
INR: 1.9 — ABNORMAL HIGH (ref 0.00–1.49)
INR: 1.9 — ABNORMAL HIGH (ref 0.00–1.49)
INR: 2.1 — ABNORMAL HIGH (ref 0.00–1.49)
INR: 2.7 — ABNORMAL HIGH (ref 0.00–1.49)
Prothrombin Time: 20 seconds — ABNORMAL HIGH (ref 11.6–15.2)
Prothrombin Time: 20.3 seconds — ABNORMAL HIGH (ref 11.6–15.2)
Prothrombin Time: 22.6 seconds — ABNORMAL HIGH (ref 11.6–15.2)
Prothrombin Time: 22.9 seconds — ABNORMAL HIGH (ref 11.6–15.2)
Prothrombin Time: 22.9 seconds — ABNORMAL HIGH (ref 11.6–15.2)
Prothrombin Time: 24.6 seconds — ABNORMAL HIGH (ref 11.6–15.2)
Prothrombin Time: 30.7 seconds — ABNORMAL HIGH (ref 11.6–15.2)

## 2010-10-20 LAB — GLUCOSE, CAPILLARY
Glucose-Capillary: 154 mg/dL — ABNORMAL HIGH (ref 70–99)
Glucose-Capillary: 157 mg/dL — ABNORMAL HIGH (ref 70–99)
Glucose-Capillary: 158 mg/dL — ABNORMAL HIGH (ref 70–99)
Glucose-Capillary: 161 mg/dL — ABNORMAL HIGH (ref 70–99)
Glucose-Capillary: 161 mg/dL — ABNORMAL HIGH (ref 70–99)
Glucose-Capillary: 165 mg/dL — ABNORMAL HIGH (ref 70–99)
Glucose-Capillary: 173 mg/dL — ABNORMAL HIGH (ref 70–99)
Glucose-Capillary: 186 mg/dL — ABNORMAL HIGH (ref 70–99)
Glucose-Capillary: 187 mg/dL — ABNORMAL HIGH (ref 70–99)
Glucose-Capillary: 191 mg/dL — ABNORMAL HIGH (ref 70–99)
Glucose-Capillary: 213 mg/dL — ABNORMAL HIGH (ref 70–99)
Glucose-Capillary: 220 mg/dL — ABNORMAL HIGH (ref 70–99)
Glucose-Capillary: 225 mg/dL — ABNORMAL HIGH (ref 70–99)
Glucose-Capillary: 231 mg/dL — ABNORMAL HIGH (ref 70–99)
Glucose-Capillary: 352 mg/dL — ABNORMAL HIGH (ref 70–99)
Glucose-Capillary: 67 mg/dL — ABNORMAL LOW (ref 70–99)
Glucose-Capillary: 84 mg/dL (ref 70–99)
Glucose-Capillary: 91 mg/dL (ref 70–99)

## 2010-10-20 LAB — BRAIN NATRIURETIC PEPTIDE: Pro B Natriuretic peptide (BNP): 115 pg/mL — ABNORMAL HIGH (ref 0.0–100.0)

## 2010-10-20 LAB — CK TOTAL AND CKMB (NOT AT ARMC)
Relative Index: INVALID (ref 0.0–2.5)
Total CK: 59 U/L (ref 7–232)

## 2010-10-20 LAB — RETICULOCYTES
RBC.: 4.08 MIL/uL — ABNORMAL LOW (ref 4.22–5.81)
Retic Count, Absolute: 53 10*3/uL (ref 19.0–186.0)
Retic Ct Pct: 1.3 % (ref 0.4–3.1)

## 2010-10-20 LAB — HEMOGLOBIN A1C: Mean Plasma Glucose: 192 mg/dL

## 2010-10-20 LAB — HEPARIN LEVEL (UNFRACTIONATED)
Heparin Unfractionated: 0.23 IU/mL — ABNORMAL LOW (ref 0.30–0.70)
Heparin Unfractionated: 0.28 IU/mL — ABNORMAL LOW (ref 0.30–0.70)
Heparin Unfractionated: 0.38 IU/mL (ref 0.30–0.70)

## 2010-10-20 LAB — IRON AND TIBC
Iron: 53 ug/dL (ref 42–135)
Saturation Ratios: 15 % — ABNORMAL LOW (ref 20–55)
TIBC: 343 ug/dL (ref 215–435)
UIBC: 290 ug/dL

## 2010-10-20 LAB — TSH: TSH: 0.106 u[IU]/mL — ABNORMAL LOW (ref 0.350–4.500)

## 2010-10-20 LAB — FERRITIN: Ferritin: 454 ng/mL — ABNORMAL HIGH (ref 22–322)

## 2010-10-20 LAB — T3, FREE: T3, Free: 2.8 pg/mL (ref 2.3–4.2)

## 2010-10-21 LAB — GLUCOSE, CAPILLARY: Glucose-Capillary: 163 mg/dL — ABNORMAL HIGH (ref 70–99)

## 2010-10-26 LAB — CBC
HCT: 31.2 % — ABNORMAL LOW (ref 39.0–52.0)
HCT: 31.7 % — ABNORMAL LOW (ref 39.0–52.0)
HCT: 38.9 % — ABNORMAL LOW (ref 39.0–52.0)
Hemoglobin: 10.4 g/dL — ABNORMAL LOW (ref 13.0–17.0)
Hemoglobin: 10.5 g/dL — ABNORMAL LOW (ref 13.0–17.0)
Hemoglobin: 11.1 g/dL — ABNORMAL LOW (ref 13.0–17.0)
Hemoglobin: 12.9 g/dL — ABNORMAL LOW (ref 13.0–17.0)
MCHC: 33 g/dL (ref 30.0–36.0)
MCHC: 33 g/dL (ref 30.0–36.0)
MCHC: 33.1 g/dL (ref 30.0–36.0)
MCHC: 33.3 g/dL (ref 30.0–36.0)
MCHC: 33.6 g/dL (ref 30.0–36.0)
MCV: 80.9 fL (ref 78.0–100.0)
MCV: 81.1 fL (ref 78.0–100.0)
MCV: 81.4 fL (ref 78.0–100.0)
MCV: 81.5 fL (ref 78.0–100.0)
Platelets: 334 10*3/uL (ref 150–400)
Platelets: 383 10*3/uL (ref 150–400)
Platelets: 401 10*3/uL — ABNORMAL HIGH (ref 150–400)
Platelets: 405 10*3/uL — ABNORMAL HIGH (ref 150–400)
RBC: 3.78 MIL/uL — ABNORMAL LOW (ref 4.22–5.81)
RBC: 3.9 MIL/uL — ABNORMAL LOW (ref 4.22–5.81)
RBC: 4.06 MIL/uL — ABNORMAL LOW (ref 4.22–5.81)
RBC: 4.81 MIL/uL (ref 4.22–5.81)
RDW: 15.2 % (ref 11.5–15.5)
RDW: 15.2 % (ref 11.5–15.5)
RDW: 15.2 % (ref 11.5–15.5)
RDW: 15.5 % (ref 11.5–15.5)
WBC: 11.5 10*3/uL — ABNORMAL HIGH (ref 4.0–10.5)
WBC: 12.2 10*3/uL — ABNORMAL HIGH (ref 4.0–10.5)
WBC: 14 10*3/uL — ABNORMAL HIGH (ref 4.0–10.5)
WBC: 14.4 10*3/uL — ABNORMAL HIGH (ref 4.0–10.5)
WBC: 22.1 10*3/uL — ABNORMAL HIGH (ref 4.0–10.5)

## 2010-10-26 LAB — HEMOGLOBIN A1C
Hgb A1c MFr Bld: 8.3 % — ABNORMAL HIGH (ref 4.6–6.1)
Mean Plasma Glucose: 192 mg/dL

## 2010-10-26 LAB — GLUCOSE, CAPILLARY
Glucose-Capillary: 118 mg/dL — ABNORMAL HIGH (ref 70–99)
Glucose-Capillary: 126 mg/dL — ABNORMAL HIGH (ref 70–99)
Glucose-Capillary: 137 mg/dL — ABNORMAL HIGH (ref 70–99)
Glucose-Capillary: 150 mg/dL — ABNORMAL HIGH (ref 70–99)
Glucose-Capillary: 152 mg/dL — ABNORMAL HIGH (ref 70–99)
Glucose-Capillary: 154 mg/dL — ABNORMAL HIGH (ref 70–99)
Glucose-Capillary: 155 mg/dL — ABNORMAL HIGH (ref 70–99)
Glucose-Capillary: 155 mg/dL — ABNORMAL HIGH (ref 70–99)
Glucose-Capillary: 184 mg/dL — ABNORMAL HIGH (ref 70–99)
Glucose-Capillary: 191 mg/dL — ABNORMAL HIGH (ref 70–99)
Glucose-Capillary: 214 mg/dL — ABNORMAL HIGH (ref 70–99)
Glucose-Capillary: 217 mg/dL — ABNORMAL HIGH (ref 70–99)
Glucose-Capillary: 284 mg/dL — ABNORMAL HIGH (ref 70–99)
Glucose-Capillary: 292 mg/dL — ABNORMAL HIGH (ref 70–99)
Glucose-Capillary: 339 mg/dL — ABNORMAL HIGH (ref 70–99)
Glucose-Capillary: 376 mg/dL — ABNORMAL HIGH (ref 70–99)
Glucose-Capillary: 382 mg/dL — ABNORMAL HIGH (ref 70–99)
Glucose-Capillary: 70 mg/dL (ref 70–99)
Glucose-Capillary: 77 mg/dL (ref 70–99)
Glucose-Capillary: 82 mg/dL (ref 70–99)
Glucose-Capillary: 93 mg/dL (ref 70–99)
Glucose-Capillary: 95 mg/dL (ref 70–99)

## 2010-10-26 LAB — COMPREHENSIVE METABOLIC PANEL
ALT: 36 U/L (ref 0–53)
ALT: 53 U/L (ref 0–53)
AST: 23 U/L (ref 0–37)
AST: 49 U/L — ABNORMAL HIGH (ref 0–37)
Albumin: 2.5 g/dL — ABNORMAL LOW (ref 3.5–5.2)
Alkaline Phosphatase: 76 U/L (ref 39–117)
BUN: 19 mg/dL (ref 6–23)
CO2: 28 mEq/L (ref 19–32)
Calcium: 7 mg/dL — ABNORMAL LOW (ref 8.4–10.5)
Calcium: 8.2 mg/dL — ABNORMAL LOW (ref 8.4–10.5)
Chloride: 99 mEq/L (ref 96–112)
Creatinine, Ser: 1.22 mg/dL (ref 0.4–1.5)
GFR calc Af Amer: >60 mL/min (ref >60)
GFR calc Af Amer: >60 mL/min (ref >60)
GFR calc non Af Amer: >60 mL/min (ref >60)
Glucose, Bld: 39 mg/dL — CL (ref 70–99)
Potassium: 2.9 mEq/L — ABNORMAL LOW (ref 3.5–5.1)
Sodium: 135 mEq/L (ref 135–145)
Sodium: 138 mEq/L (ref 135–145)
Total Bilirubin: 0.7 mg/dL (ref 0.3–1.2)
Total Protein: 6 g/dL (ref 6.0–8.3)
Total Protein: 6.7 g/dL (ref 6.0–8.3)

## 2010-10-26 LAB — DIFFERENTIAL
Band Neutrophils: 0 % (ref 0–10)
Basophils Absolute: 0.1 10*3/uL (ref 0.0–0.1)
Basophils Absolute: 0.4 10*3/uL — ABNORMAL HIGH (ref 0.0–0.1)
Basophils Relative: 2 % — ABNORMAL HIGH (ref 0–1)
Blasts: 0 %
Eosinophils Absolute: 0.4 10*3/uL (ref 0.0–0.7)
Eosinophils Relative: 2 % (ref 0–5)
Eosinophils Relative: 2 % (ref 0–5)
Lymphocytes Relative: 15 % (ref 12–46)
Lymphocytes Relative: 16 % (ref 12–46)
Lymphs Abs: 3.5 10*3/uL (ref 0.7–4.0)
Metamyelocytes Relative: 0 %
Monocytes Absolute: 1.5 10*3/uL — ABNORMAL HIGH (ref 0.1–1.0)
Monocytes Relative: 7 % (ref 3–12)
Myelocytes: 0 %
Neutro Abs: 15.4 10*3/uL — ABNORMAL HIGH (ref 1.7–7.7)
Neutro Abs: 16.3 10*3/uL — ABNORMAL HIGH (ref 1.7–7.7)
Neutrophils Relative %: 73 % (ref 43–77)
Neutrophils Relative %: 75 % (ref 43–77)
Promyelocytes Absolute: 0 %
nRBC: 0 /100 WBC

## 2010-10-26 LAB — BASIC METABOLIC PANEL
BUN: 18 mg/dL (ref 6–23)
BUN: 9 mg/dL (ref 6–23)
CO2: 30 mEq/L (ref 19–32)
CO2: 30 mEq/L (ref 19–32)
Calcium: 7.4 mg/dL — ABNORMAL LOW (ref 8.4–10.5)
Calcium: 9.1 mg/dL (ref 8.4–10.5)
Chloride: 102 mEq/L (ref 96–112)
Chloride: 102 mEq/L (ref 96–112)
Chloride: 102 mEq/L (ref 96–112)
Creatinine, Ser: 1.07 mg/dL (ref 0.4–1.5)
Creatinine, Ser: 1.09 mg/dL (ref 0.4–1.5)
Creatinine, Ser: 1.15 mg/dL (ref 0.4–1.5)
GFR calc Af Amer: >60 mL/min (ref >60)
GFR calc Af Amer: >60 mL/min (ref >60)
GFR calc non Af Amer: >60 mL/min (ref >60)
GFR calc non Af Amer: >60 mL/min (ref >60)
Glucose, Bld: 141 mg/dL — ABNORMAL HIGH (ref 70–99)
Glucose, Bld: 210 mg/dL — ABNORMAL HIGH (ref 70–99)
Potassium: 4 mEq/L (ref 3.5–5.1)
Potassium: 4.3 mEq/L (ref 3.5–5.1)
Sodium: 134 mEq/L — ABNORMAL LOW (ref 135–145)
Sodium: 139 mEq/L (ref 135–145)
Sodium: 140 mEq/L (ref 135–145)

## 2010-10-26 LAB — PROTIME-INR
INR: 1.2 (ref 0.00–1.49)
INR: 1.3 (ref 0.00–1.49)
INR: 2.2 — ABNORMAL HIGH (ref 0.00–1.49)
INR: 3.1 — ABNORMAL HIGH (ref 0.00–1.49)
Prothrombin Time: 16.2 seconds — ABNORMAL HIGH (ref 11.6–15.2)
Prothrombin Time: 22.2 seconds — ABNORMAL HIGH (ref 11.6–15.2)
Prothrombin Time: 26 seconds — ABNORMAL HIGH (ref 11.6–15.2)
Prothrombin Time: 34.7 seconds — ABNORMAL HIGH (ref 11.6–15.2)

## 2010-10-26 LAB — CULTURE, BLOOD (ROUTINE X 2)
Culture: NO GROWTH
Culture: NO GROWTH

## 2010-10-26 LAB — WOUND CULTURE

## 2010-10-26 LAB — TSH: TSH: 0.827 u[IU]/mL (ref 0.350–4.500)

## 2010-10-26 LAB — HEPARIN LEVEL (UNFRACTIONATED): Heparin Unfractionated: <0.1 IU/mL — ABNORMAL LOW (ref 0.30–0.70)

## 2010-10-27 LAB — VITAMIN B12: Vitamin B-12: 344 pg/mL (ref 211–911)

## 2010-10-27 LAB — GLUCOSE, CAPILLARY
Glucose-Capillary: 100 mg/dL — ABNORMAL HIGH (ref 70–99)
Glucose-Capillary: 106 mg/dL — ABNORMAL HIGH (ref 70–99)
Glucose-Capillary: 118 mg/dL — ABNORMAL HIGH (ref 70–99)
Glucose-Capillary: 127 mg/dL — ABNORMAL HIGH (ref 70–99)
Glucose-Capillary: 130 mg/dL — ABNORMAL HIGH (ref 70–99)
Glucose-Capillary: 138 mg/dL — ABNORMAL HIGH (ref 70–99)
Glucose-Capillary: 140 mg/dL — ABNORMAL HIGH (ref 70–99)
Glucose-Capillary: 148 mg/dL — ABNORMAL HIGH (ref 70–99)
Glucose-Capillary: 156 mg/dL — ABNORMAL HIGH (ref 70–99)
Glucose-Capillary: 158 mg/dL — ABNORMAL HIGH (ref 70–99)
Glucose-Capillary: 159 mg/dL — ABNORMAL HIGH (ref 70–99)
Glucose-Capillary: 161 mg/dL — ABNORMAL HIGH (ref 70–99)
Glucose-Capillary: 180 mg/dL — ABNORMAL HIGH (ref 70–99)
Glucose-Capillary: 181 mg/dL — ABNORMAL HIGH (ref 70–99)
Glucose-Capillary: 181 mg/dL — ABNORMAL HIGH (ref 70–99)
Glucose-Capillary: 185 mg/dL — ABNORMAL HIGH (ref 70–99)
Glucose-Capillary: 185 mg/dL — ABNORMAL HIGH (ref 70–99)
Glucose-Capillary: 186 mg/dL — ABNORMAL HIGH (ref 70–99)
Glucose-Capillary: 191 mg/dL — ABNORMAL HIGH (ref 70–99)
Glucose-Capillary: 195 mg/dL — ABNORMAL HIGH (ref 70–99)
Glucose-Capillary: 207 mg/dL — ABNORMAL HIGH (ref 70–99)
Glucose-Capillary: 207 mg/dL — ABNORMAL HIGH (ref 70–99)
Glucose-Capillary: 221 mg/dL — ABNORMAL HIGH (ref 70–99)
Glucose-Capillary: 224 mg/dL — ABNORMAL HIGH (ref 70–99)
Glucose-Capillary: 225 mg/dL — ABNORMAL HIGH (ref 70–99)
Glucose-Capillary: 227 mg/dL — ABNORMAL HIGH (ref 70–99)
Glucose-Capillary: 227 mg/dL — ABNORMAL HIGH (ref 70–99)
Glucose-Capillary: 241 mg/dL — ABNORMAL HIGH (ref 70–99)
Glucose-Capillary: 242 mg/dL — ABNORMAL HIGH (ref 70–99)
Glucose-Capillary: 262 mg/dL — ABNORMAL HIGH (ref 70–99)
Glucose-Capillary: 119 mg/dL — ABNORMAL HIGH (ref 70–99)
Glucose-Capillary: 99 mg/dL (ref 70–99)

## 2010-10-27 LAB — ABO/RH: ABO/RH(D): A NEG

## 2010-10-27 LAB — FERRITIN: Ferritin: 246 ng/mL (ref 22–322)

## 2010-10-27 LAB — ANAEROBIC CULTURE

## 2010-10-27 LAB — COMPREHENSIVE METABOLIC PANEL
ALT: 22 U/L (ref 0–53)
AST: 15 U/L (ref 0–37)
AST: 16 U/L (ref 0–37)
Albumin: 3 g/dL — ABNORMAL LOW (ref 3.5–5.2)
Albumin: 3.2 g/dL — ABNORMAL LOW (ref 3.5–5.2)
Albumin: 3.3 g/dL — ABNORMAL LOW (ref 3.5–5.2)
Alkaline Phosphatase: 49 U/L (ref 39–117)
BUN: 11 mg/dL (ref 6–23)
BUN: 13 mg/dL (ref 6–23)
BUN: 14 mg/dL (ref 6–23)
CO2: 30 mEq/L (ref 19–32)
Calcium: 8.5 mg/dL (ref 8.4–10.5)
Calcium: 9 mg/dL (ref 8.4–10.5)
Chloride: 101 mEq/L (ref 96–112)
Creatinine, Ser: 1.05 mg/dL (ref 0.4–1.5)
Creatinine, Ser: 1.12 mg/dL (ref 0.4–1.5)
GFR calc Af Amer: >60 mL/min (ref >60)
GFR calc non Af Amer: >60 mL/min (ref >60)
Glucose, Bld: 205 mg/dL — ABNORMAL HIGH (ref 70–99)
Potassium: 3.9 mEq/L (ref 3.5–5.1)
Sodium: 138 mEq/L (ref 135–145)
Total Bilirubin: 0.9 mg/dL (ref 0.3–1.2)
Total Protein: 7.1 g/dL (ref 6.0–8.3)
Total Protein: 7.5 g/dL (ref 6.0–8.3)

## 2010-10-27 LAB — BASIC METABOLIC PANEL
BUN: 11 mg/dL (ref 6–23)
BUN: 8 mg/dL (ref 6–23)
BUN: 9 mg/dL (ref 6–23)
BUN: 7 mg/dL (ref 6–23)
CO2: 24 mEq/L (ref 19–32)
CO2: 27 mEq/L (ref 19–32)
CO2: 29 mEq/L (ref 19–32)
CO2: 30 mEq/L (ref 19–32)
Calcium: 8.4 mg/dL (ref 8.4–10.5)
Calcium: 8.5 mg/dL (ref 8.4–10.5)
Calcium: 8.5 mg/dL (ref 8.4–10.5)
Calcium: 9.3 mg/dL (ref 8.4–10.5)
Calcium: 8.9 mg/dL (ref 8.4–10.5)
Chloride: 102 mEq/L (ref 96–112)
Chloride: 102 mEq/L (ref 96–112)
Chloride: 103 mEq/L (ref 96–112)
Creatinine, Ser: 0.96 mg/dL (ref 0.4–1.5)
Creatinine, Ser: 0.97 mg/dL (ref 0.4–1.5)
Creatinine, Ser: 1.06 mg/dL (ref 0.4–1.5)
Creatinine, Ser: 1.12 mg/dL (ref 0.4–1.5)
Creatinine, Ser: 0.96 mg/dL (ref 0.4–1.5)
GFR calc Af Amer: >60 mL/min (ref >60)
GFR calc Af Amer: >60 mL/min (ref >60)
GFR calc Af Amer: >60 mL/min (ref >60)
GFR calc non Af Amer: 53 mL/min — ABNORMAL LOW (ref >60)
GFR calc non Af Amer: >60 mL/min (ref >60)
GFR calc non Af Amer: >60 mL/min (ref >60)
GFR calc non Af Amer: >60 mL/min (ref >60)
GFR calc non Af Amer: >60 mL/min (ref >60)
Glucose, Bld: 115 mg/dL — ABNORMAL HIGH (ref 70–99)
Glucose, Bld: 149 mg/dL — ABNORMAL HIGH (ref 70–99)
Glucose, Bld: 186 mg/dL — ABNORMAL HIGH (ref 70–99)
Glucose, Bld: 229 mg/dL — ABNORMAL HIGH (ref 70–99)
Glucose, Bld: 114 mg/dL — ABNORMAL HIGH (ref 70–99)
Potassium: 3.3 mEq/L — ABNORMAL LOW (ref 3.5–5.1)
Potassium: 3.6 mEq/L (ref 3.5–5.1)
Potassium: 4.3 mEq/L (ref 3.5–5.1)
Potassium: 4.4 mEq/L (ref 3.5–5.1)
Sodium: 136 mEq/L (ref 135–145)
Sodium: 137 mEq/L (ref 135–145)
Sodium: 138 mEq/L (ref 135–145)
Sodium: 140 mEq/L (ref 135–145)
Sodium: 138 mEq/L (ref 135–145)

## 2010-10-27 LAB — CBC
HCT: 28.9 % — ABNORMAL LOW (ref 39.0–52.0)
HCT: 28.9 % — ABNORMAL LOW (ref 39.0–52.0)
HCT: 29.2 % — ABNORMAL LOW (ref 39.0–52.0)
HCT: 30.3 % — ABNORMAL LOW (ref 39.0–52.0)
HCT: 30.4 % — ABNORMAL LOW (ref 39.0–52.0)
HCT: 31.9 % — ABNORMAL LOW (ref 39.0–52.0)
Hemoglobin: 10.2 g/dL — ABNORMAL LOW (ref 13.0–17.0)
Hemoglobin: 11.4 g/dL — ABNORMAL LOW (ref 13.0–17.0)
Hemoglobin: 11.4 g/dL — ABNORMAL LOW (ref 13.0–17.0)
Hemoglobin: 9.8 g/dL — ABNORMAL LOW (ref 13.0–17.0)
Hemoglobin: 9.9 g/dL — ABNORMAL LOW (ref 13.0–17.0)
MCHC: 33.2 g/dL (ref 30.0–36.0)
MCHC: 33.5 g/dL (ref 30.0–36.0)
MCHC: 33.6 g/dL (ref 30.0–36.0)
MCHC: 33.6 g/dL (ref 30.0–36.0)
MCHC: 33.7 g/dL (ref 30.0–36.0)
MCHC: 34.4 g/dL (ref 30.0–36.0)
MCV: 77 fL — ABNORMAL LOW (ref 78.0–100.0)
MCV: 79.1 fL (ref 78.0–100.0)
MCV: 79.3 fL (ref 78.0–100.0)
MCV: 79.5 fL (ref 78.0–100.0)
MCV: 79.7 fL (ref 78.0–100.0)
MCV: 79.8 fL (ref 78.0–100.0)
Platelets: 258 10*3/uL (ref 150–400)
Platelets: 262 10*3/uL (ref 150–400)
Platelets: 264 10*3/uL (ref 150–400)
Platelets: 282 10*3/uL (ref 150–400)
Platelets: 372 10*3/uL (ref 150–400)
Platelets: 415 10*3/uL — ABNORMAL HIGH (ref 150–400)
Platelets: 310 10*3/uL (ref 150–400)
RBC: 3.79 MIL/uL — ABNORMAL LOW (ref 4.22–5.81)
RBC: 3.84 MIL/uL — ABNORMAL LOW (ref 4.22–5.81)
RBC: 4.02 MIL/uL — ABNORMAL LOW (ref 4.22–5.81)
RBC: 4.05 MIL/uL — ABNORMAL LOW (ref 4.22–5.81)
RDW: 14.8 % (ref 11.5–15.5)
RDW: 15 % (ref 11.5–15.5)
RDW: 15.1 % (ref 11.5–15.5)
RDW: 15.1 % (ref 11.5–15.5)
RDW: 15.2 % (ref 11.5–15.5)
RDW: 15.2 % (ref 11.5–15.5)
RDW: 14.5 % (ref 11.5–15.5)
WBC: 10.2 10*3/uL (ref 4.0–10.5)
WBC: 7.4 10*3/uL (ref 4.0–10.5)
WBC: 7.7 10*3/uL (ref 4.0–10.5)
WBC: 7.8 10*3/uL (ref 4.0–10.5)
WBC: 8.1 10*3/uL (ref 4.0–10.5)
WBC: 8.5 10*3/uL (ref 4.0–10.5)

## 2010-10-27 LAB — APTT
aPTT: 60 seconds — ABNORMAL HIGH (ref 24–37)
aPTT: 64 seconds — ABNORMAL HIGH (ref 24–37)

## 2010-10-27 LAB — DIFFERENTIAL
Basophils Absolute: 0.1 10*3/uL (ref 0.0–0.1)
Basophils Relative: 1 % (ref 0–1)
Lymphocytes Relative: 27 % (ref 12–46)
Lymphs Abs: 2.6 10*3/uL (ref 0.7–4.0)
Monocytes Absolute: 0.7 10*3/uL (ref 0.1–1.0)
Monocytes Absolute: 0.7 10*3/uL (ref 0.1–1.0)
Monocytes Relative: 8 % (ref 3–12)
Neutro Abs: 5.4 10*3/uL (ref 1.7–7.7)
Neutro Abs: 6.5 10*3/uL (ref 1.7–7.7)
Neutrophils Relative %: 60 % (ref 43–77)
Neutrophils Relative %: 63 % (ref 43–77)

## 2010-10-27 LAB — TYPE AND SCREEN: Antibody Screen: NEGATIVE

## 2010-10-27 LAB — RETICULOCYTES
Retic Count, Absolute: 38.6 10*3/uL (ref 19.0–186.0)
Retic Ct Pct: 1 % (ref 0.4–3.1)

## 2010-10-27 LAB — IRON AND TIBC
Iron: 26 ug/dL — ABNORMAL LOW (ref 42–135)
TIBC: 297 ug/dL (ref 215–435)
UIBC: 271 ug/dL

## 2010-10-27 LAB — WOUND CULTURE: Gram Stain: NONE SEEN

## 2010-10-27 LAB — PROTIME-INR
INR: 1.6 — ABNORMAL HIGH (ref 0.00–1.49)
INR: 1.8 — ABNORMAL HIGH (ref 0.00–1.49)
INR: 2.1 — ABNORMAL HIGH (ref 0.00–1.49)
INR: 2.2 — ABNORMAL HIGH (ref 0.00–1.49)
INR: 2.3 — ABNORMAL HIGH (ref 0.00–1.49)
INR: 2.5 — ABNORMAL HIGH (ref 0.00–1.49)
INR: 2.9 — ABNORMAL HIGH (ref 0.00–1.49)
INR: 1.7 — ABNORMAL HIGH (ref 0.00–1.49)
Prothrombin Time: 18.2 seconds — ABNORMAL HIGH (ref 11.6–15.2)
Prothrombin Time: 20 seconds — ABNORMAL HIGH (ref 11.6–15.2)
Prothrombin Time: 22 seconds — ABNORMAL HIGH (ref 11.6–15.2)
Prothrombin Time: 27.8 seconds — ABNORMAL HIGH (ref 11.6–15.2)
Prothrombin Time: 32.2 seconds — ABNORMAL HIGH (ref 11.6–15.2)
Prothrombin Time: 32.9 seconds — ABNORMAL HIGH (ref 11.6–15.2)

## 2010-10-27 LAB — VANCOMYCIN, TROUGH
Vancomycin Tr: 19.4 ug/mL (ref 10.0–20.0)
Vancomycin Tr: 36.5 ug/mL (ref 10.0–20.0)

## 2010-11-24 NOTE — Discharge Summary (Signed)
NAME:  [name], [name] NO.:  [id number]   MEDICAL RECORD NO.:  [id number]          PATIENT TYPE:  INP   LOCATION:  2003                         FACILITY:  [facility]   PHYSICIAN:  Madolyn Frieze. [name], MD, FACCDATE OF BIRTH:  [birth year]   DATE OF ADMISSION:  11/16/2008  DATE OF DISCHARGE:  11/23/2008                               DISCHARGE SUMMARY   PRIMARY CARDIOLOGIST:  Madolyn Frieze. [name], MD, FACC   ENDOCRINOLOGY:  Tonita Cong, MD   GASTROENTEROLOGIST:  Wilhemina Bonito. Marina Goodell, MD   The patient is also followed by the Wound Clinic and the Coumadin Clinic  at [facility].   DISCHARGING DIAGNOSES:  1. Atrial fibrillation status post transesophageal echocardiography      with direct current cardioversion on Nov 20, 2008.  2. Diabetes, poorly managed status post consult with Dr. [name] this      admission.  3. Hypertension.  4. Microcytic anemia status post gastroenterology consultation with      Dr. Marina Goodell this admission.  5. Hypothyroidism status post endocrinology consult with Dr. [name] this      admission.  6. Hypercholesteremia.  7. Lower extremity leg ulcer status post wound consult this admission      with recommendations to follow up outpatient in the Wound Clinic as      previously instructed.  8. Chronic anticoagulation therapy.  On this day, the patient is      subtherapeutic with an INR of 1.9, pending repeat INR this      afternoon.  If INR is to equal to or greater than 2.0, the patient      may be discharged home on this date.  If not, will need to remain      until therapeutic.   PAST MEDICAL HISTORY:  1. Diabetes for 20 years.  2. Hypertension for years.  3. Hyperlipidemia.  4. Chronic atrial fibrillation.  5. 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  6. Chronic renal insufficiency.  7. Morbid obesity.  8. Recurrent cellulitis  9. Diabetic neuropathy.   PAST SURGICAL HISTORY:  1. Perianal abscess resection.  2. Abdominal tumor  removal.  3. Tonsillectomy.   PROCEDURES THIS ADMISSION:  Transesophageal echocardiography with direct  current cardioversion on Nov 20, 2008.   CONSULTS THIS ADMISSION:  1. Tonita Cong, MD on Nov 18, 2008.  2. [facility] consult on Nov 19, 2008.   HOSPITAL COURSE:  [name] is a 60 year old gentleman followed by Dr.  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  There was septal hypokinesis and apical  dyskinesis.  Stress Myoview was reviewed by Dr. [name], the patient's  primary cardiologist with no further evaluation warranted at this time.  On day of admission, the patient was nauseated and vomiting.  Apparently  on his third trip to the bathroom, he vomited and collapsed to the  ground.  EMS was called.  Apparently, he was bradycardic with a heart  rate in  the 20s when EMS arrived.  There are no strips from EMS to  confirm this.  Per EMS report, the patient also had altered mental  status or perhaps loss of consciousness.  The patient does not report  losing consciousness, and he also denied any chest discomfort.  In the  ER, he had ZOLL pacing, underneath he had an junctional escape rhythm at  20 with atrial fibrillation without acute ST or T-wave changes.  The  patient was alert and oriented in the emergency room when evaluated by  Dr. Antoine Poche.  In the emergency room, his labs demonstrated an elevated  potassium of 5.9 and a creatinine of 2.1, INR therapeutic at 2.6, WBC  17.4, hemoglobin 13.3.  The patient's blood pressure 90/50, heart rate  of 20.  The patient's bradycardia/junctional escape rhythm was thought  to be due to his AV nodal blocking agents.  The patient also  hyperkalemic with suspected viral gastroenteritis.  The patient was  admitted by Dr. Antoine Poche.  Adjustments  made in medications.  By Nov 17, 2008, the patient's ventricular rate in the 80s and 90s.  Backup  pacemaker rate was turned down to 35.  Throughout the day, the patient  did well without further episodes of bradycardia.  Potassium improved at  4.5, and pacemaker wire discontinued.  Other pertinent lab work showed a  TSH of 0.106 and a hemoglobin A1c of 8.3.  Dr. [name] was asked to  evaluate for low TSH and uncontrolled type 2 diabetes.  He made  recommendations and medications and agreed to follow the patient  outpatient in the office setting.  The patient also had a 2-D  echocardiogram done that showed EF of 55-60%, left atrium mildly dilated  wall thickness, left ventricle increased in a pattern of mild LVH.  The  patient's heart rate continued to remain stable.  T3, T4 obtained within  normal limits.  The patient's hemoglobin 10.3, hematocrit 29.6.  Iron  panel drawn, reticulocyte count drawn, and ferritin level high at 454.  GI was asked to consult for microcytic anemia.  Ideally, they would like  to proceed with a colonoscopy off Coumadin, could do EGD sooner.  Protonix or Aciphex daily for now.  Dr. Marina Goodell discussed the situation  with the patient and family and offered option of doing in-hospital pre-  TEE cardioversion or an outpatient evaluation in the next few months,  off Coumadin.  The patient favors latter approach; therefore, Dr. Marina Goodell  recommended the patient will go home on empiric PPI and arrange a GI  office visit outpatient.  Potassium remained stable at 3.7.  The patient  underwent TEE with direct current cardioversion with synchronized 200-  joule biphasic shock x1 with conversion to normal sinus rhythm.  The  patient with lower extremity slow-healing ulcer, wound care was asked to  consult for recommendations.  It would appear that [name] is being  followed by Dr. Murray Hodgkins for wound care.  It was recommended that  the patient continue Neosporin to promote moist  healing of the wound and  follow up outpatient as previously prescribed and/or recommended.  Dr.  [name] in to see the patient on Nov 22, 2008, INR 1.9, hemoglobin  stable at 9.8, potassium 3.6.  The patient remained in normal sinus  rhythm.  Dr. [name] instructed to continue heparin until INR at or  greater than 2.  On Nov 23, 2008, INR in the morning 1.9 still, the  patient very adamant to be  discharged home.  However, Dr. Antoine Poche feels  that the patient needs to maintain heparin drip until INR at or greater  than 2; therefore, we will plan to repeat PT/INR around 1 o'clock this  afternoon.  If INR is at that time 2 or above, I will discharge the  patient home to follow up outpatient.  If not, he will need to remain  until INR is therapeutic.  Tentative arrangement includes medications as  instructed below:  1. Lantus 66 units daily.  2. Coumadin 5 mg daily except 7.5 mg on Mondays.  3. Lasix 40 mg daily.  4. Toprol-XL 50 mg daily.  5. Crestor 20 daily.  6. Lisinopril 10 daily.  7. Multivitamin daily.  8. Neurontin 300 t.i.d.  9. Vitamin D 5000 units weekly.  10.Darvocet as previously prescribed.  11.Aciphex 20 mg daily.  12.Metformin 1000 mg twice a day.  13.Lopid 600 mg 2 times a day.  14.The patient's Lanoxin, hydrochlorothiazide, Cardizem, and Actos      have all been stopped.   The patient has instructions to follow up in the Coumadin Clinic on Dec 02, 2008, at 10:45; Dr. [name] within 1 week; Dr. [name] on December 11, 2008, at 3:00 p.m.; Dr. Marina Goodell on January 06, 2009, at 10:00 a.m.  The  patient to continue using Neosporin to ulcer and follow up in the Wound  Clinic as previously prescribed.   DURATION OF DISCHARGE ENCOUNTER:  Well over 30 minutes.   We will dictate an addendum INR level.      Dorian Pod, ACNP      Madolyn Frieze. [name], MD, [facility]  Electronically Signed    MB/MEDQ  D:  11/23/2008  T:  11/24/2008  Job:  [id number]   cc:   Tonita Cong,  M.D.  Wilhemina Bonito. Marina Goodell, MD

## 2010-11-24 NOTE — Assessment & Plan Note (Signed)
[facility]   NAME:  [name], [name]               ACCOUNT NO.:  [id number]   MEDICAL RECORD NO.:  [id number]      DATE OF BIRTH:  September 11, 1950   PHYSICIAN:  Lenon Curt. Chilton Si, M.D.   VISIT DATE:  10/25/2008                                   OFFICE VISIT   HISTORY:  A 60 year old male with nonhealing surgically reduced wound  over the left foot on the dorsal surface, returns today for recheck.  Wound has done well since last visitation, there is some minimal amount  of drainage.  There is less discomfort.  There has been no foul odor.  No fever or chills.   PHYSICAL EXAMINATION:  Temperature 97.8, pulse 80, respirations 20,  blood pressure 117/69.  Capillary glucose 230 mg percent.  Wound measurement:  Left dorsal foot,  4.7 x 1.9 x 0.2 cm.  Wound appears slightly smaller  at either end and seems to filled in a little bit in the middle.  There  is a soft slough present over much of the wound base.  There is  granulation tissue present.  There is no significant purulent drainage  or odor to the wound.   TREATMENT:  Sharp debridement was done of the right foot removing  callus, fibrous tissue, and slough basically with forceps and scalpel.  This is a selective debridement under no anesthesia that was tolerated  well, less than 20 cm2 of tissue was involved, no bleeding was  encountered.   Following debridement, we applied Puracol, adaptic, and gauze.  He will  return in 1 week for reevaluation.  He is advised that it is okay to  shower, pad his wound, then dry and replace dressings.   ICD-9 code 998.83, nonhealing surgical wound.  CPT [id number] and 16109 selective debridement less than 20 cm2.      Lenon Curt Chilton Si, M.D.  Electronically Signed     AGG/MEDQ  D:  10/25/2008  T:  10/26/2008  Job:  [id number]

## 2010-11-24 NOTE — H&P (Signed)
NAME:  [name], [name]               ACCOUNT NO.:  [id number]   MEDICAL RECORD NO.:  [id number]          PATIENT TYPE:  INP   LOCATION:  NA                           FACILITY:  [facility]   PHYSICIAN:  Della Goo, M.D. DATE OF BIRTH:  [birth year]   DATE OF ADMISSION:  07/29/2008  DATE OF DISCHARGE:                              HISTORY & PHYSICAL   PRIMARY CARE PHYSICIAN:  Unassigned.  The patient is from [place].   CHIEF COMPLAINT:  Worsening redness, swelling, left foot and lower leg.   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  The patient states that he had been  seen in the emergency department 2 weeks ago secondary to a wound on the  inner aspect of the left lower leg and was placed on antibiotic therapy  of Keflex.  He states that this area continued to worsen, reddened and  swelled.  He also states that the area is painful.  The patient also  states that he has had nausea, vomiting and diarrhea during that 2-week  period.  The patient denies having any chest pain, shortness of breath.   The patient was seen in the emergency department, evaluated.  He was  also found to be hypoglycemic.  The patient was administered IV dextrose  with improvement in his blood sugar.  The patient states that he had  taken 100 units of Novolin insulin prior to coming to the emergency  department and had not eaten.  The patient was also found to be  hypotensive as well and fluid resuscitation was started.   PAST MEDICAL HISTORY:  1. Type 2 diabetes mellitus.  2. Hyperlipidemia.  3. Hypertension.  4. Diabetic neuropathy.  5. Morbid obesity.   PAST SURGICAL HISTORY:  1. 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  2. Tonsillectomy and adenoidectomy.  3. Perianal abscess  incision and drainage.   MEDICATIONS AT THIS TIME:  1. Simvastatin 40 mg once daily.  2. Metoprolol tartrate 25 mg one p.o. b.i.d.  3. Humulin R with special dosing.  4. Hydrochlorothiazide 25 mg one p.o. b.i.d.  5. Lisinopril 40 mg one p.o. daily.  6. Keflex 500 mg one p.o. q.i.d.   ALLERGIES:  NO KNOWN DRUG ALLERGIES.   SOCIAL HISTORY:  The patient is and is a Naval architect.  She denies  tobacco, alcohol and illicit drug usage.   FAMILY HISTORY:  Noncontributory.   PHYSICAL EXAMINATION FINDINGS:  GENERAL:  This is a 60 year old morbidly  obese male in mild distress.  VITAL SIGNS:  Temperature is 97.7, blood pressure 90/58, heart rate 95,  respirations 18 on the initial intake.  His O2 sats were 98%.  HEENT:  Examination normocephalic, atraumatic.  Pupils equally round  reactive to light.  Extraocular movements are intact funduscopic benign.  There is no scleral icterus.  Nares are patent bilaterally.  Oropharynx  is clear.  NECK:  Supple full range of motion.  No thyromegaly, adenopathy or  jugular venous distention.  CARDIOVASCULAR:.  Regular rate and rhythm.  No murmurs, gallops or rubs.  LUNGS:  Clear to auscultation bilaterally.  ABDOMEN:  Positive bowel sounds, soft, nontender, nondistended.  No  hepatosplenomegaly.  No rebound no guarding.  EXTREMITIES:  Without cyanosis, clubbing or edema of the right lower  leg.  The left lower leg has 2+ edema to the pretibial area.  There is  redness in the distal 2/3 of the left lower leg and along the medial  aspect of the left lower leg is a flat based ulcer which has an  erythematous base.  There is no exudation from the ulcer.  This ulcer is  a quarter size like the coin.  NEUROLOGIC:  The patient is alert and oriented.  He has generalized  weakness but otherwise there are no focal deficits.   LABORATORY STUDIES:  White blood cell count 22.1, hemoglobin 12.9,  hematocrit 38.9, platelets 383, neutrophils 73%, lymphocytes 16%.   Sodium 138, potassium 2.9, chloride 99, carbon dioxide 28, BUN 19,  creatinine 1.22 and glucose 39 initially.  Repeat after 1 ampule of  dextrose had been given and glucose increased to 70.  X-rays of the left  foot reveal no acute bony abnormalities.  Chest x-ray reveals slight  fullness of the mediastinum.  Mild cardiac enlargement with slight  pulmonary venous congestion.   ASSESSMENT:  A 60 year old male being admitted with  1. Cellulitis of left lower leg.  2. Hypoglycemia.  3. Hypokalemia.  4. Type 2 diabetes mellitus.  5. Hypotension.   PLAN:  The patient will be admitted, placed on IV antibiotic therapy of  vancomycin and Unasyn.  Wound care therapy will be requested.  The  patient will have repletion of his potassium and IV fluids have been  ordered for fluid resuscitation.  The patient will have monitoring of  his glucose level and sliding scale insulin coverage for elevated blood  sugars.  The patient will be placed on DVT and GI prophylaxis and his  regular medications will be further verified.  It is not clear what his  regular amounts of insulin is.   ADDENDUM:  The patient began to have ectopy on the monitor and an EKG  was performed which revealed atrial fibrillation with a rate up to 150.  This was atrial fibrillation with rapid ventricular rate.  The patient's  status was upgraded to ICU status and an IV diltiazem drip was started.  The patient also was continued on fluid resuscitation.      Della Goo, M.D.  Electronically Signed     HJ/MEDQ  D:  07/30/2008  T:  07/31/2008  Job:  [id number]

## 2010-11-24 NOTE — Assessment & Plan Note (Signed)
[facility]   NAME:  [name], [name]               ACCOUNT NO.:  [id number]   MEDICAL RECORD NO.:  [id number]      DATE OF BIRTH:  08/23/1950   PHYSICIAN:  Lenon Curt. Chilton Si, M.D.   VISIT DATE:  01/03/2009                                   OFFICE VISIT   HISTORY:  A 60 year old male returns for recheck of wound on the right  foot dorsal surface.  We feel that the wound has progressed well.  The  patient continues to complain of pain, but there is no evidence of  inflammation or irritation.  Wound progression has now come along for  enough that I think the patient could be discharged from clinic safely.  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  I am not sure this is particularly wise, however,  the borders on indigency and apparently this is his way of earning and  living.   It is noted that the patient has a high pulse rate of about 130 today.  Temperature was 98.4, respirations 20, and blood pressure 135/91.  Wound  measurements of right dorsal foot now 1.8 x 1.5 x 0.1.   TREATMENT:  We only had to apply Band-Aid over the wound surface today.  We did recommend he call his cardiologist about his heart rate of 130 to  see if this could come under better control.  He denies much of this.  Again, there has been no chest pain.   ICD-9 code 998.83, nonhealing surgical wound.  CPT code 04540.   Discharged from Wound Care today due to healing of the wound.  Return as  needed.      Lenon Curt Chilton Si, M.D.  Electronically Signed     AGG/MEDQ  D:  01/03/2009  T:  01/04/2009  Job:  [id number]

## 2010-11-24 NOTE — H&P (Signed)
NAME:  [name], [name] NO.:  [id number]   MEDICAL RECORD NO.:  [id number]          PATIENT TYPE:  INP   LOCATION:  2907                         FACILITY:  [facility]   PHYSICIAN:  Rollene Rotunda, MD, FACCDATE OF BIRTH:  [birth year]   DATE OF ADMISSION:  11/16/2008  DATE OF DISCHARGE:                              HISTORY & PHYSICAL   PRIMARY CARE PHYSICIAN:  Dr. Lelon Mast.   CARDIOLOGIST:  Madolyn Frieze. [name], MD, [facility].   REASON FOR PRESENTATION:  Bradycardia.   HISTORY OF PRESENT ILLNESS:  The patient 60 year old gentleman followed  by Dr. [name].  He has persistent atrial fibrillation. He has been  managed with rate control and anticoagulation.  He actually been busy  recently and I do note from Dr. Ludwig Clarks last visit that he was  stopping his Cardizem.  He is continuing Toprol XL.  He is continuing  digoxin.  The patient ran out of digoxin and actually has only been  taking his as 0.25 mg dose for the last couple of days as he got it  refilled.  It seems like he might still be taking his Toprol-XL by his  report and his Cardizem.  Of note, he also had a recent stress perfusion  study.  This demonstrated possible old inferior infarct with mild peri-  infarct ischemia.  There was septal hypokinesis and apical dyskinesis.  Dr. [name] reviewed this and thought no further evaluation was  warranted.   Today the patient became nauseated and started throwing up.  On his  third trip to the bathroom to vomit he collapsed to the ground.  That  was this evening.  EMS was called but they do not have any of these  records.  Apparently he was bradycardic with heart rate in the 20s when  they found him.  They said he had altered mental status or perhaps loss  of consciousness.  The patient does not report losing consciousness.  There was no chest discomfort reported by the patient who is quite alert  in the ER.  He does have the Zoll pacing.  Underneath he has a  junctional escape rhythm with 20 with atrial fibrillation.  There are no  acute ST-T wave changes.  He has been slightly short of breath.  However, he is not describing classic PND or orthopnea.  He has not been  having any neck or arm discomfort.  He otherwise has been feeling well.  He has not been having any fevers or chills.  He gets around and has not  been having decreased exercise tolerance or increased dyspnea with  exertion.  Today is really the first day he did not feel well.   In the emergency room his labs are now coming back demonstrating a  mildly elevated potassium 5.9.  His calcium is slightly low.  He does  have history of chronic renal insufficiency, though see I can see his  last baseline creatinine was normal.  Tonight his creatinine is 2.1.  INR 2.6.  WBC 17.4.  Hemoglobin 13.3.  Sodium 132.   PAST MEDICAL HISTORY:  1. Diabetes  mellitus for 20 years.  2. Hypertension for years.  3. Hyperlipidemia.  4. Chronic atrial fibrillation.  5. 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  6. Chronic renal insufficiency.  7. Morbid obesity.   PAST SURGICAL HISTORY:  1. Perianal abscess resected.  2. Abdominal tumor removed as a child.  3. Tonsillectomy.   ALLERGIES/INTOLERANCES:  None.   MEDICATIONS:  1. Potassium 20 mg b.i.d. (he has not been taking this).  2. Neurontin 300 mg t.i.d.  3. Lasix 40 mg b.i.d.  4. Pravachol 80 mg daily.  5. Lanoxin 0.25 mg daily.  6. Doxycycline.  7. Glucophage 150 mg b.i.d.  8. Actos 15 mg.  9. Lantus 40 mg daily.  10.Coumadin.  11.Toprol-XL 200 mg daily.  12.Protonix 40 mg daily.  13.Hydrochlorothiazide 25 mg daily.  14.Lantus 60 units at bedtime.  15.NovoLog.  16.Ocuvite.  17.Multivitamin.  18.Cardizem 240 mg daily.   SOCIAL HISTORY:  The patient lives with a sister.  He does have a child  but he is apparently estranged from the child.  He does not smoked  cigarettes or drink alcohol.   FAMILY HISTORY:  Family  history is unknown as he is adopted.   REVIEW OF SYSTEMS:  As stated in HPI; negative for all other systems.   PHYSICAL EXAMINATION:  GENERAL:  Patient is mildly uncomfortable but in  no acute distress.  VITAL SIGNS:  Blood pressure 90/50, heart rate 20 and regular, afebrile.  HEENT:  Eyes unremarkable.  Pupils equal, round and reactive to light.  Fundi not visualized.  Oral  mucosa unremarkable.  NECK:  No jugular distention although it was a difficult exam with his  obesity.  Carotid upstroke brisk and symmetrical.  No bruits, no obvious  thyromegaly.  Lymphatics compromised exam, no obvious cervical, axillary  adenopathy.  I could not feel his inguinal area with his large pannus.  LUNGS:  Clear to auscultation bilaterally.  BACK:  No costovertebral angle tenderness.  CHEST:  Normal.  HEART:  PMI not displaced or sustained.  S1 and S2 within normal limits.  No S3, S4, no clicks, rubs, murmurs.  ABDOMEN:  Morbidly obese, positive  bowel sounds, normal frequency and pitch.  No bruits, rebound, guarding  or midline pulsatile mass.  No hepatomegaly obvious, no appreciable  splenomegaly.  SKIN:  No rashes.  No nodules.  EXTREMITIES:  2+ pulses throughout.  Trace lower extremity edema.  Chronic venous stasis changes, nonhealing ulcer on the dorsum of his  left foot.  NEUROLOGIC:  Oriented to person, place and time.  Cranial nerves II-XII  grossly intact.  Motor grossly intact.   LABORATORY DATA:  EKG shows junctional escape rhythm, rate 20s,  underlying atrial fibrillation, no acute ST wave changes.  Labs as  above.  Chest x-ray pending.   ASSESSMENT/PLAN:  1. Bradycardia.  The patient is in atrial fibrillation with a      junctional escape rhythm.  This may be due simply to his AV nodal      blocking agents.  I doubt it is digoxin toxicity as it is not an      accelerated junctional rhythm and he has only been back on the      medication for 2 days at a low dose.  I doubt this is  related to      the slightly elevated potassium.  He needs a temporary pacemaker.      I will consider Kayexalate if his potassium comes back more  elevated, though I would like to avoid this as he will probably      have a groin temporary wire and I would like to avoid diarrhea.      Otherwise clear his electrolytes as needed.  I will rule out      myocardial infarction.  Will consider permanent pacemaker if he has      no resolution of this.  Of course, we are going to be avoiding all      AV nodal blocking agents.  Will check a digoxin level and treat as      needed.  2. Abnormal Cardiolite per Dr. [name].  No further follow up is      planned unless of course he rules in during this admission.  3. Renal insufficiency.  Apparently has chronic renal insufficiency      related to diabetes and hypertension.  I will hydrate him and hold      his Glucophage.  4. Diabetes.  He will continue his previous insulin with sliding scale      insulin.  5. Elevated white blood cell count.  No clear etiology though he might      have a viral gastroenteritis.  Will check a urinalysis though he is      not complaining of any symptoms.  Check a chest x-ray after the      temporary wire is in place.  6. Obesity.  This gentleman needs to lose weight and needs to continue      to be educated.      Rollene Rotunda, MD, [facility]  Electronically Signed     JH/MEDQ  D:  11/17/2008  T:  11/17/2008  Job:  [id number]

## 2010-11-24 NOTE — Assessment & Plan Note (Signed)
[facility]   NAME:  [name], Paul NO.:  [id number]   MEDICAL RECORD NO.:  [id number]      DATE OF BIRTH:  1950-10-18   PHYSICIAN:  Maxwell Caul, M.D. VISIT DATE:  09/26/2008                                   OFFICE VISIT   HISTORY OF PRESENT ILLNESS:  [name] is a 60 year old man referred  through [facility] for our review of a nonhealing  surgical wound on the dorsal aspect of his left foot in the setting of a  history of very significant cellulitis.   The patient tells me that he has had problems for roughly 2 months.  Paradoxically, this actually started with a lesion on the medial aspect  of his left leg.  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  He was started on antibiotics at that  time.  The infection seemed to spread down his leg into his foot and he  actually required admission to hospital in late January with a very  significant cellulitis.  He actually was discharged from hospital on  August 07, 2008 but required readmission to the hospital on August 12, 2008 with spreading cellulitis of the left leg complicated by new-onset  atrial fibrillation.  He was in hospital from August 12, 2008 through  August 27, 2008.  He had an MRI that showed marked cellulitis on the  dorsum of the foot with a large subcutaneous abscess but imaged muscles  and tendons appear to be normal.  There was no suggestion of  osteomyelitis.  He was taken to the operating room by Dr. Lajoyce Corners on  August 23, 2008 for irrigation and debridement of a deep abscess of  the left foot.  I believe the culture showed coag-negative staph and he  has been discharged on doxycycline.  It would appear that at some point  he was discharged with a wound VAC, he did not pick this up while he was  in the clinic.  He had Home Health up until 2 weeks ago but most  recently he has been cleaning this with  antibacterial soap, applying  SilvaSorb gel, 4 x 4s, Kerlix, and an Ace wrap.  He was doing this  himself with help from his sister.  He thinks things have been  improving.   PAST MEDICAL HISTORY:  1. Recent onset of atrial fibrillation being followed by [facility]      Cardiology.  2. Hypertension.  3. 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  4. Hyperlipidemia.  5. Obesity.  6. History of renal insufficiency.   CURRENT MEDICATIONS:  List is extensive and includes:  1. Klor-Con 20 b.i.d.  2. Neurontin 300 t.i.d.  3. Lasix 40 b.i.d.  4. Pravastatin 40 two tablets at bedtime.  5. Lanoxin 0.25 daily.  6. Doxycycline 100 b.i.d.  7. Glucophage 850 b.i.d.  8. Lisinopril 40 daily.  9. Tiazac 240 daily.  10.Coumadin 5 daily.  11.Toprol-XL 200 daily.  12.Protonix 40 daily.  13.Hydrochlorothiazide 20 one-half tablet daily.  14.He is on 60 units of Lantus at night.  15.Sliding scale NovoLog.   SOCIAL HISTORY:  The patient lives alone.  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   REVIEW OF SYSTEMS:  RESPIRATORY:  No complaints of shortness of breath.  CARDIAC:  No current complaints of chest pain.   PHYSICAL EXAMINATION:  VITAL SIGNS:  Temperature is 98, pulse 139,  respirations 19, and blood pressure 138/85.  This was repeated.  He had  a pulse rate of 90, but this was clearly atrial fibrillation.  RESPIRATORY:  Clear air entry bilaterally.  CARDIAC:  Heart sounds are regular.  He has no murmurs.  No signs of  congestive heart failure.  EXTREMITIES:  His peripheral pulses are palpable at the dorsalis pedis.  His capillary refill time seems normal.  He did have a duplex ultrasound  that showed normal ABIs in the hospital.   WOUND EXAMINATION:  The wound is on the dorsal aspect of his left foot.  As mentioned, this was a surgical wound in the setting of significant  cellulitis and deep subcutaneous abscess.  The wound itself was covered  by a adherent  surface eschar.  This required an excisional debridement  to remove most of this, although he is going to continue to need Santyl.  I did not reculture this wound.  There was no evidence of surrounding  cellulitis.  No purulent drainage, although the patient does still  complain of significant pain.   IMPRESSION:  Surgical wound nonhealing in the setting of significant  cellulitis of the foot and an abscess status post surgical drainage.  By  definition, this is a Wegener's grade 3 wound.  This underwent  debridement as noted above.  We will need to change him to [facility] from a  silver based wound dressing.  I wrote him a prescription for this.  I  also gave him pain killers in the form of hydrocodone/APAP.  We will  need to follow this carefully.  Right now, this does not appear to have  any current infection.  He remains on doxycycline.  I will review with  the cultures on E-chart to make sure that the culture organism is  covered by his doxycycline.  I will dictate an addendum if this turns  out not to be true.           ______________________________  Maxwell Caul, M.D.     MGR/MEDQ  D:  09/26/2008  T:  09/26/2008  Job:  [id number]

## 2010-11-24 NOTE — Op Note (Signed)
NAME:  [name], [name]               ACCOUNT NO.:  [id number]   MEDICAL RECORD NO.:  [id number]          PATIENT TYPE:  INP   LOCATION:  1428                         [facility]AC[facility]   PHYSICIAN:  Nadara Mustard, MD     DATE OF BIRTH:  09-10-1950   DATE OF PROCEDURE:  08/23/2008  DATE OF DISCHARGE:                               OPERATIVE REPORT   PREOPERATIVE DIAGNOSIS:  Deep abscess left foot.   POSTOPERATIVE DIAGNOSIS:  Deep abscess left foot.   PROCEDURE:  1. Irrigation and debridement deep abscess left foot.  2. Application of wound VAC set to minus 75 mmHg negative pressure.   SURGEON:  Nadara Mustard, MD   ANESTHESIA:  General.   ESTIMATED BLOOD LOSS:  Minimal.   ANTIBIOTICS:  Vancomycin preoperatively.   CULTURES OBTAINED:  x2.   DISPOSITION:  To PACU in stable condition.   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  The patient has failed conservative care with IV  antibiotics as well as hospitalization.  MRI scan confirms a large deep  abscess and he presents at this time for surgical intervention.  Risks  and benefits were discussed including infection, neurovascular injury,  persistent pain, nonhealing wound, need for additional surgery,  potential for amputation.  The patient states he understands and wished  to proceed at this time.   PROCEDURE IN DETAIL:  The patient was brought to OR room 6 and underwent  a general anesthetic.  After adequate level of anesthesia obtained the  patient's left lower extremity was prepped using DuraPrep and draped  into a sterile field.  A dorsal incision was made over the third  webspace between the third and fourth toes.  This was just medial to the  fourth ray.  Blunt dissection was carried down to a large necrotic deep  abscess.  This was debrided.  Rongeurs was used to excise most of the  abscess.  The  cultures were obtained with both aerobic and anaerobic.  The  wound was irrigated with pulsatile lavage, 3 liters.  The wound was  loosely closed with 2-0 nylon.  A wound VAC was applied set to minus 75  mm of negative suction.  This had a good seal.  The patient was then  extubated and taken to the PACU in stable condition.      Nadara Mustard, MD  Electronically Signed     MVD/MEDQ  D:  08/23/2008  T:  08/23/2008  Job:  [id number]

## 2010-11-24 NOTE — Consult Note (Signed)
NAME:  [name], [name]               ACCOUNT NO.:  [id number]   MEDICAL RECORD NO.:  [id number]          PATIENT TYPE:  INP   LOCATION:  1428                         FACILITY:  [facility]   PHYSICIAN:  Nadara Mustard, MD     DATE OF BIRTH:  10-28-1950   DATE OF CONSULTATION:  08/21/2008  DATE OF DISCHARGE:                                 CONSULTATION   HISTORY OF PRESENT ILLNESS:  The patient is a 60 year old gentleman who  is seen for consultation for a left foot abscess.  The patient states he  has been hospitalized twice with a total of 17 days' hospitalization  over the past 20 days.  The patient has been admitted for abscess and  infection for the left lower extremity.   PAST MEDICAL HISTORY:  1. Significant for recurrent cellulitis and ulcer of the left foot.  2. Chronic AFib.  3. Hypertension.  4. Diabetes.  5. Dyslipidemia.  6. Morbid obesity.  7. Acute renal insufficiency.  8. Anemia.   PAST SURGICAL HISTORY:  Positive for a stomach tumor removed at 6 months  of age, pulmonary embolus, tonsillectomy, adenoidectomy and history of  perineal abscess.   FAMILY HISTORY:  Noncontributory.   SOCIAL HISTORY:  The patient is single with one child.  He Is a Ecologist.   MEDICATIONS:  Include Zocor, metoprolol, Humulin and  hydrochlorothiazide.   ALLERGIES:  No known drug allergies.   PHYSICAL EXAM:  The patient is alert, oriented.  There is no adenopathy.  He is afebrile.  He has venous stasis insufficiency with venous stasis  swelling in both lower extremities with venous stasis ulcer on the left  leg.  He has a massive cellulitis swelling and warmth of the left foot.  This does not change with elevation.  He has a strong dorsalis pedis  pulse.   Review of his MRI scan shows a large dorsal abscess on the left foot.  Ankle-brachial indices were obtained with an ABI of greater than one  bilaterally.  He does have a strong dorsalis pedis pulse.   ASSESSMENT:  1. Deep  abscess left foot, dorsum of the foot.  2. Failure of conservative care with intravenous antibiotics.   PLAN:  Due to his persistent cellulitis and deep abscess, I discussed  with the patient recommendations to proceed with an irrigation and  debridement and application of wound VAC.  Risks and benefits were  discussed including persistent infection, nonhealing of the wound, need  for additional surgery.  The patient states he understands and wishes to  proceed at this time.  We will set this up for tomorrow with Surgery.      Nadara Mustard, MD  Electronically Signed     MVD/MEDQ  D:  08/21/2008  T:  08/21/2008  Job:  [id number]

## 2010-11-24 NOTE — Assessment & Plan Note (Signed)
[facility]   NAME:  [name], [name]               ACCOUNT NO.:  [id number]   MEDICAL RECORD NO.:  [id number]      DATE OF BIRTH:  Nov 27, 1950   PHYSICIAN:  Lenon Curt. Chilton Si, M.D.   VISIT DATE:  12/13/2008                                   OFFICE VISIT   HISTORY:  A 60 year old male returns for reinspection of nonhealing  surgically at this wound over the left foot on the dorsal surface.  The  patient continues to do a good job taking care of this wound that is  shrinking in size.  There is less pain than previous, although he still  complains of the left discomfort to be using hydrocodone 10/325.  He  requests another prescription for this.  There has been no fever,  chills, or foul odor to the wound since last seen.   PHYSICAL EXAMINATION:  Temperature 97.9, pulse 101, respirations 18,  blood pressure 130/77.  Capillary glucose 122.  Wound measurements, the  left dorsal foot now down to 1.8 x 1.9 x 0.1 cm.  The wound has a thin,  yellow orange slack-like material on the top and extending over the  edges.  There is new skin going underneath this slack-like material.   TREATMENT:  Sharp debridement was done under no anesthesia to remove the  periwound scab slack-like material from the surface.  This was done with  lidocaine gel anesthesia.  He tolerated it well.  There was a selective  debridement of less than 20 cm2 done with scalpel and forceps.  Minimal  bleeding was encountered at the distal end of the wound which was  controlled with silver nitrite.   Following debridement, we applied Puracol, hydrogel, and gauze.  We  recommended he do this at home every 3 days.  As noted above, we  refilled was hydrocodone/APAP 10/325 with 60 tablets one every 6 hours  as needed for pain.   ICD-9 code 998.83, nonhealing surgical wound.  CPT code 95621 and [phone].      Lenon Curt Chilton Si, M.D.  Electronically Signed     AGG/MEDQ  D:  12/13/2008  T:  12/14/2008  Job:   [id number]

## 2010-11-24 NOTE — Assessment & Plan Note (Signed)
[facility]   NAME:  [name], [name]               ACCOUNT NO.:  [id number]   MEDICAL RECORD NO.:  [id number]      DATE OF BIRTH:  12/26/1950   PHYSICIAN:  Joanne Gavel, M.D.         VISIT DATE:  12/27/2008                                   OFFICE VISIT   HISTORY OF PRESENT ILLNESS:  A 60 year old male with a wound on the  dorsum of the left foot.  Previously, the wound was 1.8 x 1.9.  It was  sharply debrided on December 13, 2008, by Dr. Murray Hodgkins and he was treated  with Puracol, hydrogel, and gauze.   PHYSICAL EXAMINATION:  The patient is afebrile, stable.  Today, the  wound is 1.5 x 0.5 and is covered by a scab which is easily removed.  The resulting wound is a very superficial and probably 0.1 x 0.1.   We will continue to treat with Puracol, hydrogel, and gauze, and I  believe it will be healed very soon.  We will see him in 7 days and  possibly at that time he can be discharged.      Joanne Gavel, M.D.  Electronically Signed     RA/MEDQ  D:  12/27/2008  T:  12/28/2008  Job:  [id number]

## 2010-11-24 NOTE — Assessment & Plan Note (Signed)
[facility]   NAME:  [name], Paul               ACCOUNT NO.:  [id number]   MEDICAL RECORD NO.:  [id number]      DATE OF BIRTH:  19-Feb-1951   PHYSICIAN:  Lenon Curt. Chilton Si, M.D.   VISIT DATE:  11/08/2008                                   OFFICE VISIT   HISTORY:  A 60 year old male returns for re-inspection of a nonhealing  surgically-induced wound over the left foot on the dorsal surface.  He  has been doing a good job taking care of it.  He denies any particular  problems over the last 2 weeks.  The wound seems to be shrinking slowly  in size.   PHYSICAL EXAMINATION:  VITAL SIGNS:  Temp 97.6, pulse 130, respirations  20, blood pressure 130/78.  Capillary glucose 153 mg%.  WOUND MEASUREMENTS:  The wound at the left dorsal foot now 3.5 x 1.4 x  0.1.  These were substantially reduced from prior measurements of 4.7 x  1.9 x 0.2.  Wound has a beefy red color at the base.  There is a thin  shellac proteinaceous material across the wound surface.  There is no  significant drainage.   TREATMENT:  Sharp debridement with 5% lidocaine ointment anesthesia was  done with a scalpel.  Minimal bleeding was encountered, which was  controlled with pressure.  Overall appearance of the wound was improved  since I last saw it.  Slough and fibrous material were removed.  This is  a selective debridement of 20 sq. cm or less.   Following sharp debridement, we applied Puracol to the wound, Adaptic,  and gauze.  The patient is to return in 2 weeks.  Should bandages fly  off, he will continue to manage himself at home until scheduled to  return.   ICD-9 code 998.83.  CPT code 79024 and [phone] selective debridement less  than 20 sq. cm.      Lenon Curt Chilton Si, M.D.  Electronically Signed     AGG/MEDQ  D:  11/08/2008  T:  11/09/2008  Job:  [id number]

## 2010-11-24 NOTE — Cardiovascular Report (Signed)
NAME:  [name], [name]               ACCOUNT NO.:  [id number]   MEDICAL RECORD NO.:  [id number]          PATIENT TYPE:  INP   LOCATION:  2003                         FACILITY:  [facility]   PHYSICIAN:  Everardo Beals. Juanda Chance, MD, FACCDATE OF BIRTH:  [birth year]   DATE OF PROCEDURE:  12/18/2008  DATE OF DISCHARGE:  11/23/2008                            CARDIAC CATHETERIZATION   CLINICAL HISTORY:  [name] is a 60 year old who was admitted by Dr.  Antoine Poche with atrial fibrillation and marked bradycardia with a  junctional escape rhythm in association with chest pain and a potassium  of 5.9.  We plan to put a temporary pacemaker and stabilize until the  cause of the bradycardia was found.   PROCEDURE:  The procedure was performed by the right femoral vein using  a venous sheath.  A temporary pacemaker was passed through the right  ventricle without problems.  Good threshold was obtained.  The patient  tolerated the procedure well and left the laboratory in satisfactory  condition.   CONCLUSION:  Successful placement, temporary pacemaker via the right  femoral vein.      Bruce Elvera Lennox Juanda Chance, MD, [facility]  Electronically Signed     BRB/MEDQ  D:  12/19/2008  T:  12/19/2008  Job:  [id number]

## 2010-11-24 NOTE — Assessment & Plan Note (Signed)
[facility]   NAME:  [name], PANGILINAN               ACCOUNT NO.:  [id number]   MEDICAL RECORD NO.:  [id number]      DATE OF BIRTH:  Jun 06, 1951   PHYSICIAN:  Leonie Man, M.D.         VISIT DATE:                                   OFFICE VISIT   PROBLEM:  Nonhealing surgically induced wound of the left foot and  dorsum.  This has been treated most recently with selective debridement  and Puracol to the wound.  The patient is complaining of pain on today's  evaluation, rate of 6/10.  Apparently, this pain is away from the wound  and is in the sole of the foot.   PHYSICAL EXAMINATION:  The patient is afebrile.  Temperature 98, pulse  84, respirations 20, blood pressure 164/82.  Capillary blood glucose is  112.  The base of the wound is clean with good granulations.  Wound  measurements today are 1.9 x 1.0 x 0.1.   TREATMENT:  Promogran, hydrogel, and a protective pad which the patient  will change on a daily basis.  He will follow up in 2 weeks to see Dr.  Chilton Si.      Leonie Man, M.D.  Electronically Signed     PB/MEDQ  D:  12/02/2008  T:  12/02/2008  Job:  [id number]

## 2010-11-24 NOTE — Discharge Summary (Signed)
NAME:  [name], [name]               ACCOUNT NO.:  [id number]   MEDICAL RECORD NO.:  [id number]          PATIENT TYPE:  INP   LOCATION:  1428                         FACILITY:  [facility]   PHYSICIAN:  Hillery Aldo, M.D.   DATE OF BIRTH:  July 20, 1950   DATE OF ADMISSION:  08/12/2008  DATE OF DISCHARGE:                               DISCHARGE SUMMARY   Interim discharge summary/progress note.   PRIMARY CARE PHYSICIAN:  Unassigned.  The patient has been referred to  [facility] for hospital follow-up on his previous hospital stay.   DISCHARGE DIAGNOSES:  1. Recurrent cellulitis/nonhealing diabetic foot ulcer.  2. Chronic atrial fibrillation.  3. Hypertension.  4. Diabetes mellitus.  5. Dyslipidemia.  6. Morbid obesity.  7. Acute renal insufficiency.  8. Normocytic anemia.   DISCHARGE MEDICATIONS:  Will be dictated at the time of actual  discharge.   CONSULTATIONS:  Telephone consultation made with Dr. Madilyn Fireman and Dr.  Cleophas Dunker of vascular and orthopedic surgery respectively.  Diagnostic  evaluation ongoing and both physicians will see the patient when  diagnostic testing is completed.   PROCEDURES AND DIAGNOSTIC STUDIES:  Left foot films on August 12, 2008,  showed extensive soft tissue swelling over the dorsum of the lateral  aspect of the foot without underlying osseous destruction or clear gas  in the soft tissues.  Findings are most compatible with cellulitis.  Vascular calcifications compatible with small vessel disease and  diabetes.   DISCHARGE LABORATORY VALUES:  Will be dictated at the time of actual  discharge.   HOSPITAL COURSE BY PROBLEM:  1. Recurrent cellulitis/nonhealing ulcer in a diabetic male:  The      patient has had a 4-6-week history of a nonhealing wound on his      left dorsal foot.  The patient apparently completed a 2-week course      of Keflex prior to his initial presentation at the hospital on      July 30, 2008.  He was subsequently admitted  and treated with 1      week of IV antibiotic therapy including Unasyn and vancomycin.  He      was discharged on Augmentin and completed a 5-day course of therapy      with this before he returned to the hospital with complaints of      ongoing erythema and nonhealing.  The patient was readmitted and      restarted on Unasyn and vancomycin.  Wound cultures have only grown      Candida species.  Because of concerns of the fact that this has      been a prolonged course of therapy with no evidence of healing, Dr.      Cleophas Dunker, of orthopedic surgery, and Dr. Madilyn Fireman, of vascular      surgery, were contacted for help with management.  Dr. Cleophas Dunker      plans on seeing the patient but did suggest a vascular surgery      consult.  At this time, ankle-brachial indices have been ordered      and are pending.  An MRI scan has  been ordered and the patient will      be transported to [facility] on Sunday to get this done, as      he is too large to have the scan done at the facility here.  Dr.      Madilyn Fireman will formally consult on the patient if the ankle-brachial      indices are abnormal.  The patient was also started on Diflucan for      yeast and topical Lotrimin therapy.  2. Chronic atrial fibrillation:  The patient is currently rate      controlled and anticoagulated.  3. Hypertension:  The patient's blood pressure has been well      controlled throughout his hospital stay.  4. Diabetes:  The patient has had fairly elevated glucoses.  His      insulin therapy has been titrated up to achieve better glycemic      control.  New coverage of 8 units of NovoLog along with insulin-      resistant sliding scale and Lantus at 50 units subcutaneously      nightly are all keeping the patient's blood glucoses reasonable.  A      hemoglobin A1c was checked on July 29, 2008, and was 8.3,      corresponding to a mean plasma glucose of 192.  We will continue to      monitor his glycemic control  and adjust his insulin therapy as      needed.  5. Dyslipidemia:  Patient is being maintained on statin therapy.  6. Morbid obesity:  The patient was seen by the dietician during his      previous admission.  He remains on a carbohydrate-modified diet.  7. Acute renal insufficiency:  The patient's creatinine was elevated      over baseline upon presentation.  His renal function has been      monitored and, at this point, it is approaching his baseline value      of 1.06.  8. Normocytic anemia:  The patient did have an anemia panel drawn to      elucidate the source of his anemia.  His reticulocyte count was      inappropriately suppressed given his anemia at 38.6.  His iron was      low at 26 but total iron binding capacity normal at 297, 9%      saturation.  Vitamin B12 was 344 with a folate level of 17.3 and a      ferritin of 246.  These findings suggest anemia of chronic disease      with low iron stores.   DISPOSITION:  The patient will be discharged home when medically stable.  A follow-up discharge summary addendum will be dictated at that time.      Hillery Aldo, M.D.  Electronically Signed     CR/MEDQ  D:  08/16/2008  T:  08/16/2008  Job:  [id number]

## 2010-11-24 NOTE — Assessment & Plan Note (Signed)
[facility]   NAME:  [name], [name] NO.:  [id number]   MEDICAL RECORD NO.:  [id number]      DATE OF BIRTH:  02/07/1951   PHYSICIAN:  Maxwell Caul, M.D. VISIT DATE:  10/04/2008                                   OFFICE VISIT   [name] is a gentleman who we saw for the first time on September 26, 2008.  He has a surgical wound on the dorsal aspect of his left foot.  This was a significant cellulitis and abscess.  He was operated on by  Dr. Lajoyce Corners on August 23, 2008.  Cultures were really not all that  helpful.  I review this on E chart and put the results at the bottom of  my initial H and P on him.  In any case, when he arrived here last week,  his surgical wound was covered by an adherent surface eschar.  This  underwent a difficult excisional debridement at that time and he has  been using Santyl on the wound moist gauze being dressed by his sister.   He returns today in followup.   He is thinking the wound looks a lot better.  He is not having any undue  pain.  His temperature is 97.6.  Wound dimensions are roughly the same  as last time measuring 6.2 x 2.7 x 0.4, the base of this looks a lot  healthier.  I am now able to debride down what looks like a healthy  granulating base.  Nevertheless, he is going to require further Santyl  treatments.  In view of the short week, next week, I will put his follow  up to the week after.   IMPRESSION:  Surgical wound nonhealing in the setting of type 2 diabetes  with a history of neuropathy.  This is a Wegener's grade 3 wound.  We  are going to continue with the Santyl and look towards changing this to  a different dressing the week after next.           ______________________________  Maxwell Caul, M.D.     MGR/MEDQ  D:  10/04/2008  T:  10/05/2008  Job:  [id number]

## 2010-11-24 NOTE — Consult Note (Signed)
NAME:  JOVAN, COLLIGAN NO.:  [id number]   MEDICAL RECORD NO.:  [id number]          PATIENT TYPE:  INP   LOCATION:  1237                         [facility]ACILITY:  [facility]   PHYSICIAN:  Madolyn Frieze. [name], MD, FACCDATE OF BIRTH:  1950/12/27   DATE OF CONSULTATION:  DATE OF DISCHARGE:                                 CONSULTATION   HISTORY OF PRESENT ILLNESS:  Mr. Scibilia is a 60 year old gentleman with  past medical history of diabetes, hypertension, hyperlipidemia, obesity  admitted with left lower extremity cellulitis.  I am asked to evaluate  for atrial fibrillation.  The patient was admitted on July 29, 2008  with left lower extremity edema and cellulitis.  He has been placed on  IV antibiotics.  He was noted to be in atrial fibrillation and  cardiology is asked to further evaluate.  Note, he is previously resided  in [place], but is moving to this area.  He does have chronic  dyspnea on exertion but there is no orthopnea, PND, palpitations,  presyncope, syncope or exertional chest pain.  He has had recent  increased pedal edema on the left related to his infection.  Note, the  duration of atrial fibrillation is unknown.  His medications at present  include Cardizem drip.  He is also on Dilaudid, enoxaparin 80 mg subcu  daily, Protonix 40 mg IV daily, Senokot, Unasyn, vancomycin .   ALLERGIES:  He has NO KNOWN DRUG ALLERGIES.   SOCIAL HISTORY:  He does not smoke nor does he consume alcohol.   FAMILY HISTORY:  Is is unknown as he is adopted.   PAST MEDICAL HISTORY:  Significant for diabetes mellitus approximately  20 years.  There is also hypertension, hyperlipidemia.  He does have  obesity.  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  He has also had a previous tonsillectomy.   REVIEW OF SYSTEMS:  The patient does state that he is had the flu  recently.  This was associated with fevers and chills over the previous  week.  He has had a nonproductive cough.  There is no hemoptysis.  There  is no dysphagia, odynophagia, melena, hematochezia.  He has had  diarrhea.  There is no dysuria or hematuria, no rash or seizure  activity.  There is orthopnea, PND but there has been pedal edema on the  left as well as the pain from his infection.  Remaining systems are  negative.   PHYSICAL EXAM:  VITAL SIGNS:  Blood pressure of 128/68, pulse is 102,  temperature is 98.1 with a T-max of 100, he is 95% room air.  GENERAL:  He is well-developed and morbidly obese.  He is in no acute  distress at present.  His skin is warm and dry.  He does not appear to  be depressed.  There is no peripheral clubbing.  BACK:  Normal.  HEENT:  Normal.  NECK:  Supple with normal upstroke bilaterally.  No bruits noted.  There  is no jugular venous distention adenopathy or thyromegaly.  Note his  exam is difficult due to his obesity.  His chest is clear to  auscultation, normal expansion.  CARDIOVASCULAR:  Regular rate and  rhythm.  Normal S1-S2.  I cannot appreciate murmurs, rubs or gallops.  ABDOMINAL:  Difficult due to his obesity.  There is no tenderness  palpation noted.  There is no hepatosplenomegaly.  There is no masses.  His femoral pulses are not palpated.  EXTREMITIES:  Show 1+ edema from the knee down on the left.  There is  also erythema.  There is also a small ulcer.  The distal pulses not  palpable.  He does have 2+ distal pulse on the right.  NEUROLOGIC::  Grossly intact.   LABORATORIES:  Show a sodium of 137, potassium 3.3,BUN and creatinine  18, 1.15 respectively.  His white blood cell count is 20.5, hemoglobin  is 11.2 and hematocrit is 33.7, platelets 334.  Liver functions were  significant for an SGOT that was mentally elevated to 49.  A chest x-ray  showed slight fullness of the mediastinum (could not exclude adenopathy  and chest CT was recommended).  Foot x-ray showed no acute bony  abnormalities.  His left  cardiogram shows atrial fibrillation with rapid  ventricle response and nonspecific ST changes.   DIAGNOSIS:  1. Atrial fibrillation:  The duration of Mr. Mccuiston' atrial      fibrillation is unknown.  He has had a no symptoms of this by      history (he does have chronic mild dyspnea on exertion).  I agree      with Cardizem for rate control as well as Lovenox.  We will check      an echocardiogram as well as a TSH.  Once it is clear that his foot      will not require any kind of surgical intervention, I would begin      Coumadin with goal INR of 2-3.  We will then plan on proceeding      with cardioversion as an outpatient 3 weeks after his INR is      therapeutic.  If his rate is difficult to control, then we could      proceed with a TEE guided cardioversion.  Also note that if his LV      function is normal then we will plan an outpatient Myoview for risk      stratification given his multiple risk factors.  2. Diabetes mellitus:  Management per primary care.  3. Lower extremity cellulitis:  Management per primary care.  He will      continue on his antibiotics.  4. Hypertension:  His blood pressure is adequately controlled on his      present medications.  5. Hyperlipidemia:  He would benefit from a statin long-term given his      history of diabetes mellitus.  I will leave this to the primary      care service.  6. Obesity:  He needs an outpatient sleep study.   We will be happy to follow while he is in the hospital.   MEDICATIONS:  Thank you      Madolyn Frieze. [name], MD, [facility]  Electronically Signed     BSC/MEDQ  D:  07/30/2008  T:  07/30/2008  Job:  [id number]

## 2010-11-24 NOTE — Op Note (Signed)
NAME:  [name], [name] NO.:  [id number]   MEDICAL RECORD NO.:  [id number]          PATIENT TYPE:  INP   LOCATION:  2003                         FACILITY:  [facility]   PHYSICIAN:  Bevelyn Buckles. Bensimhon, MDDATE OF BIRTH:  Dec 06, 1950   DATE OF PROCEDURE:  DATE OF DISCHARGE:                               OPERATIVE REPORT   PROCEDURE:  Transesophageal echocardiogram cardioversion.   PATIENT IDENTIFICATION:  [name] is a 60 year old male with history  of atrial fibrillation, dating back to January.  He was admitted with  symptomatic bradycardia.  He is now stabilized.  He is referred for TEE-  guided cardioversion.  This morning his heparin level was on the low end  of therapeutic at 0.3.  His heparin was adjusted by pharmacy and we gave  him 1000 units of IV heparin prior to this procedure to ensure adequate  anticoagulation.   TEE was performed, which showed normal LV function.  Left atrial  appendage was quite small but did not have any apparent clot.  After  further sedation, he received a single 200-joule synchronized biphasic  shock with prompt conversion to sinus rhythm.  There were no apparent  complications with the procedure.      Bevelyn Buckles. Bensimhon, MD  Electronically Signed     DRB/MEDQ  D:  11/20/2008  T:  11/20/2008  Job:  [id number]

## 2010-11-24 NOTE — Assessment & Plan Note (Signed)
[facility]   NAME:  [name], [name]               ACCOUNT NO.:  [id number]   MEDICAL RECORD NO.:  [id number]      DATE OF BIRTH:  [birth year]   PHYSICIAN:  Lenon Curt. Chilton Si, M.D.   VISIT DATE:  10/18/2008                                   OFFICE VISIT   HISTORY:  A 60 year old male [facility]  for reinspection and treatment of a wound of the dorsum of the left  foot.  This wound resulted from an opening of an abscess by Dr. Lajoyce Corners and  then an area of skin that remained exposed.  The patient has had  debridements in his clinic in the past, and he continues to have a  moderate amount of discomfort and is using hydrocodone/APAP 5/325 for  this.  He says it is really not strong enough.   EXAMINATION:  Capillary glucose 110 mg%, afebrile.  The patient does  have some evidence of tenderness at the wound of the left foot.  Previous eschar has been removed.  There is a soft slough/eschar present  across the wound today.  Wound measures now 4.7 x 2.0 x 0.2 cm, and  there is really a very little weeping from the wound.   TREATMENT:  Sharp debridement took place with removal of the soft  eschar/slough from the dorsum of the right foot.  No anesthesia was  used.  We basically used forceps to remove this.  It was a 20 sq cm or  less selective debridement.  No bleeding was encountered.  The patient  tolerated the procedure well.   Following the debridement, we applied Puracol, Adaptic, and gauze to the  wound of the left foot dorsum.   A prescription was written for hydrocodone 10/325 to be taken 1 every 6-  8 hours as needed for pain.  The patient is advised to return to clinic  in 1 week.   ICD-9 code, 998.83, nonhealing surgical wound.   CPT, [id number] and 16109 selective debridement of less than 20 sq cm.      Lenon Curt Chilton Si, M.D.  Electronically Signed     AGG/MEDQ  D:  10/18/2008  T:  10/19/2008  Job:  [id number]

## 2010-11-24 NOTE — Consult Note (Signed)
NAME:  [name], MURDAUGH NO.:  [id number]   MEDICAL RECORD NO.:  [id number]          PATIENT TYPE:  INP   LOCATION:  2003                         FACILITY:  [facility]   PHYSICIAN:  Tonita Cong, M.D.  DATE OF BIRTH:  [birth year]   DATE OF CONSULTATION:  11/18/2008  DATE OF DISCHARGE:                                 CONSULTATION   REQUESTING PHYSICIAN:  Madolyn Frieze. [name], MD, FACC   REASON FOR CONSULTATION:  Low TSH and diabetes mellitus.   HISTORY OF PRESENT ILLNESS:  Mr. Farrel is a 60 year old gentleman.  He  recalls diabetes mellitus diagnosed about 20-25 years ago.  His  complications include distal peripheral neuropathy.  He is uncertain  about whether he has diabetic retinopathy or diabetic nephropathy.  His  symptoms includes polyuria.  At home, he recalls using Lantus 65 units  at bedtime, Humalog around 50 units at each meal as well as Glucophage  500 mg 2 tabs by mouth twice daily.  He does not recall using  glimepiride though recent emergency department notes document  glimepiride as one of his home medication.  His blood glucoses recently  ranged between 80-250 mg/dL, usually he checks before breakfast.  To his  knowledge, he never had an abnormal thyroid function test.  Aggravating  factors for hyperglycemia include dietary indiscretions.   FAMILY MEDICAL HISTORY:  Adopted.  Family history: unknown.   PAST MEDICAL HISTORY:  1. Obesity.  2. Type 2 diabetes mellitus.  3. Hypertension.  4. Hyperlipidemia.  5. Chronic atrial fibrillation.   SOCIAL HISTORY:  Lives with his sister now.  No tobacco use.   REVIEW OF SYSTEMS:  Remarkable for polyuria and bilateral leg edema that  is chronic.  He has lost weight intentionally after a previous hospital  stay with the help of diuretic therapy.  He denies nausea, vomiting,  diarrhea, constipation, poor appetite, or abdominal pain.  He reports  numbness and tingling in his feet as well as an ulcer on his left  foot  which received debridement.  No chest pain at this moment.  Denies  blurry vision.   PHYSICAL EXAMINATION:  VITAL SIGNS:  Temperature 98.5, blood pressure  148/65, pulse 84, respirations 13, and weight 156.7 kg.  GENERAL:  No apparent distress, lying quietly in bed.  He notes his  femoral sheath was just removed, so he is not yet able to sit or stand  for the physical exam.  NECK:  Supple and nontender.  Trachea midline.  No palpable thyroid  nodule or goiter, but difficult to examine due to his adiposity and body  position in the bed.  HEAD, EARS, NOSE, AND THROAT:  Unremarkable.  EYES:  Pupils equal and round, conjunctivae clear.  Sclerae anicteric.  Extraocular movements are intact.  CARDIOVASCULAR:  Irregularly irregular rhythm.  Mild bilateral pitting  edema.  Full symmetric dorsalis pedis pulses.  RESPIRATORY:  Clear,  symmetrical, and unlabored.  No respiratory distress.  Normal  respiratory effort.  GASTROINTESTINAL:  Active normal-pitch bowel sounds.  Soft, nontender,  and nondistended.  No appreciable hepatomegaly.  NEUROLOGIC:  Movement  intact in all extremities.  Fine touch sensation  intact in the feet.  No tremors.  MUSCULOSKELETAL:  No atrophy or deformity other than a healing foot  ulcer on the dorsum of his left foot.  SKIN:  Same as above.  Skin is appropriately warm and moist.  No other  rashes except healing foot ulcer covered with gauze bandage.  No  maceration between the toes.  MENTAL STATUS EXAM:  Affect appropriate and congruent mood.  No  hallucinations or delusions evident.  Eye contact good.  Judgment and  insight seem appropriate.   LABORATORY DATA:  TSH 0.106 micro units per milliliter.  Hemoglobin A1c  8.3%.  Creatinine 0.98.  White blood count 11.9 and hemoglobin 11.5.  Liver enzymes normal.   ASSESSMENT:  1. Uncontrolled type 2 diabetes mellitus.  2. Mildly low TSH.  3. Bilateral pedal edema.   Likely the low TSH is from a non-thyroidal  illness.  He does not recall  any recent corticosteroid therapy or other causes for low TSH.  Even if  he did have subclinical hyperthyroidism, we would consider therapy if  TSH was below 0.1 mcg/mL (which is not at this time).   RECOMMENDATIONS:  1. Review the free T4 and free T3 levels, that are already ordered      today.  2. Plan to obtain TSH and free T4 in 2-4 weeks after his release in      the hospital.  3. If TSH remains low, consider thyroid uptake and scan if it has been      sufficient time since past iodinated contrast exposure or a panel      of thyroid antibodies.  4. At this time, discontinue Actos since he has peripheral edema.  5. Resume his metformin 500 mg 2 tabs by mouth twice daily after his      discharge from the hospital if his renal function remains stable.  6. Discontinue the current sliding scale NovoLog which provides only 1-      5 units at bedtime and 1-15 units at meal times.  7. Continue the Lantus 60 units subcutaneous at bedtime that has      already been started in the hospital.  8. Change the NovoLog to 30 units at each meal plus 5 additional units      for every 50 mg/dL of blood glucose above 100 mg/dL.  The      correction factor can be used both at meal times and bedtime.   This was translated into a simple scale for the sake of the nursing  staff and patient.  Of note, the NovoLog doses are considerably small  than his usual doses of Humalog at home.  However, some patients require  less in the hospital than they do an outpatient in part due to changes  in diet and other factors.   It is recommended the patient call [phone] for scheduled appointment  within 1 week after his hospital discharge to see me at [facility]  at [facility] for an outpatient visit to reassess his glycemic  control and follow his thyroid function test.      Tonita Cong, M.D.  Electronically Signed     JSK/MEDQ  D:  11/18/2008  T:  11/19/2008   Job:  [id number]

## 2010-11-24 NOTE — Assessment & Plan Note (Signed)
[facility]                            CARDIOLOGY OFFICE NOTE   NAME:[name], [name]                        MRN:          [id number]  DATE:09/24/2008                            DOB:          [birth year]    The patient is a 60 year old gentleman, who was recently admitted to the  [facility] with lower extremity edema and cellulitis.  He was  noted to be in atrial fibrillation at that time and we were asked to  consult during that admission.  Note, he had an echocardiogram on  July 30, 2008, that showed normal LV function.  He did have a TSH  during that admission that was low at 0.291.  He was treated with rate-  controlling medications and also antibiotics for his lower extremity  cellulitis.  He was discharged.  Since then, he has done reasonably  well.  He has mild dyspnea on exertion, but there is no orthopnea or PND  and there is no pedal edema.  He has not had chest pain, palpitations,  or syncope.  However, in the past 5-6 days, he has noted intermittent  dizziness.  This occurs 1 hour after taking his medications.  This is  more prominent with standing and improves with sitting.  Again, he has  not had frank syncope.   PRESENT MEDICATIONS:  1. Potassium 20 mEq tablets 1 p.o. b.i.d., but he is not taking at      present.  2. Neurontin 300 mg p.o. t.i.d.  3. Lasix 40 mg p.o. b.i.d.  4. Pravachol 80 mg p.o. daily.  5. Lanoxin 0.25 mg p.o. daily.  6. Doxycycline.  7. Glucophage 850 mg tablets 1 p.o. b.i.d.  8. Actos 15 mg p.o. daily.  9. Lisinopril 40 mg p.o. daily.  10.Coumadin as directed.  11.Toprol 200 mg p.o. daily, but he has not taken this in the past 3      days due to his dizziness.  12.Protonix 40 mg p.o. daily.  He has not taken this as well.  13.HCTZ 25 mg one half p.o. daily.  14.Insulin.  15.Multivitamin.  16.Ocuvite.  17.He also has been on Cardizem 240 mg p.o. daily, but he has not been      taken this  recently.   PHYSICAL EXAMINATION:  VITAL SIGNS:  Today shows a blood pressure of  136/64 and his pulse is 102.  GENERAL:  He is well developed and obese.  HEENT:  Normal.  NECK:  Supple.  CHEST:  Clear.  CARDIOVASCULAR:  Irregular rhythm.  ABDOMEN:  No tenderness.  EXTREMITIES:  Chronic skin changes, but there is no edema.   His electrocardiogram shows atrial fibrillation at a rate of 102.  There  are nonspecific ST changes.   DIAGNOSES:  1. Atrial fibrillation - the duration of this is unclear to me.  The      patient is also taking a multitude of medications.  He has      developed dizziness 1 hour after taking his medications and I think      his blood pressure is most likely  dropping.  Note, he is taking      Toprol, Coreg, digoxin, and Cardizem.  He discontinued his Toprol      and Cardizem on his own, as he felt it was contributing to his      dizziness.  For now, we will continue with his digoxin, but I will      continue off of his Cardizem and I have discontinued his Coreg.  I      have asked him to resume his Toprol at 200 mg p.o. daily.  We will      need to track his blood pressure and pulse and adjust as indicated.      He will also continue on his Coumadin.  Once he has been      therapeutic for 3 consecutive weeks, then we will plan to proceed      with an outpatient cardioversion.  Note, the duration of his atrial      fibrillation is unknown.  Also, of note, we need to repeat his TSH.      His left ventricular function is normal.  2. Dizziness - as per above.  This appears to be medication related.      We are making new adjustments and hopefully this will improve.  I      will see him back in approximately 2 weeks to review.  3. Hypertension - his blood pressure is controlled today.  We will      continue to track this with our medication adjustments and adjust      his regimen as indicated.  4. Diabetes mellitus.  5. Hyperlipidemia - he will continue on his  statin and we will check      lipids and liver.  6. Recent cellulitis.  7. Morbid obesity.  8. History of renal insufficiency - we are planning to repeat his      BMET.   We will also schedule him to have a Myoview.     Madolyn Frieze [name], MD, [facility]  Electronically Signed    BSC/MedQ  DD: 09/24/2008  DT: 09/25/2008  Job #: [id number]

## 2010-11-24 NOTE — Discharge Summary (Signed)
NAME:  [name], [name]               ACCOUNT NO.:  [id number]   MEDICAL RECORD NO.:  [id number]          PATIENT TYPE:  INP   LOCATION:  1432                         [facility]ACILITY:  [facility]   PHYSICIAN:  Theodosia Paling, MD    DATE OF BIRTH:  1951/03/01   DATE OF ADMISSION:  07/29/2008  DATE OF DISCHARGE:  08/07/2008                               DISCHARGE SUMMARY   PRIMARY CARE PHYSICIAN:  Unassigned.  The patient will be following up  with [facility].   ADMITTING HISTORY:  Please refer to the excellent admission note  dictated by Dr. Della Goo under history of present illness.   DISCHARGE DIAGNOSES:  1. Left lower extremity cellulitis.  2. New onset atrial fibrillation.  3. Morbid obesity.  4. History of type 2 diabetes mellitus.  5. Hyperlipidemia.  6. History of hypertension.   DISCHARGE MEDICATIONS:  1. Include the patient is to continue home medication which include      simvastatin 40 mg daily.  2. New medications added, Augmentin 875 p.o. q.12 hours for next 1      week.  3. Coreg  mg q.12 hours.  4. Lanoxin 250 mcg p.o. daily  5. Cardizem and Diltiazem extended release 240 mg daily.  6. Lasix 20 mg p.o. q.12 hours.  7. Amaryl 2 mg daily.  8. Hydrochlorothiazide 12.5 mg daily.  9. Percocet 5/325 mg p.o. q.4 hours 1 tablet as needed.  10.Sliding scale insulin.  11.Lantus insulin 40 units subcu at bedtime.  12.Lisinopril 40 mg p.o. daily.  13.Metformin 500 mg a q.12 hours.  14.Metoprolol succinate which is Toprol XL 200 mg p.o. daily.  15.Protonix 40 mg p.o. daily.  16.Senokot 1 tablet p.o. nightly.  17.Zocor 40 mg p.o. daily.  18.Coumadin 7.5 mg p.o. daily dose to be adjusted by discharging      physician.   HOSPITAL COURSE:  1. Following issues were addressed during the hospitalization.  Left-      sided cellulitis.  The patient received IV vancomycin and Indocin      initially and was switched over to IV Unasyn.  He had reasonable      improvement.  He also  developed stasis blisters and the wound care      nurse was consulted.  He underwent wound care.  According to them,      wound care instruction needs to be mentioned and will be mentioned      in the discharge instructions.  The patient will be continuing to      take Augmentin p.o. q.12 hours for next 1 week.  Also he is      instructed to lose body weight as there is a competent of      lymphedema contributing to the cellulitis and which can interfere      essentially with the healing of the same.  2. Atrial fibrillation.  Cardiology was consulted.  The patient      received echo which did not show any intracardiac clot.  Heart rate      was finally controlled with the combination of digoxin, metoprolol  and Cardizem.  The patient is currently therapeutic on Coumadin      which will be continued at the time of discharge.  3. He did not have any hemodynamic instability with atrial      fibrillation.  4. Type 2 diabetes mellitus.  Diabetes coordinator was consulted.  The      patient's blood glucose is under good control at this time with the      regimen of Lantus sliding scale insulin, metformin, and oral      hyperglycemic agent.  5. Morbid obesity.  The patient is consulted on weight loss and most      likely will need outpatient sleep study.  6. Hypertension.  His blood pressure is currently controlled on      several different antihypertensive medication which include      metoprolol, hydrochlorothiazide, Diltiazem, lisinopril.   CONSULTATION PERFORMED:  December 28, 2008 by Dr. Olga Millers.   PROCEDURE PERFORMED:  None.   IMAGING PERFORMED:  CT scan of the lower extremity performed on July 30, 2008 to look for abscess, was negative for any abscess.  Was  suggestive of cellulitis.  Chest x-ray performed July 29, 2008 showed  slight fullness of mediastinum.  X-ray of the foot did not show any  osteomyelitis.   Total time spent in discharge of the patient 1 hour.   This discharge  summary needs to be reviewed by discharging physician, which showed  include discharge medications and all possible instructions.   DISPOSITION:  The patient will follow up with [facility] within 1  week's time.  The patient will also follow up with [facility] as  an outpatient for the atrial fibrillation management.      Theodosia Paling, MD  Electronically Signed     NP/MEDQ  D:  08/06/2008  T:  08/06/2008  Job:  [id number]

## 2010-11-24 NOTE — Discharge Summary (Signed)
NAME:  [name], [name]               ACCOUNT NO.:  [id number]   MEDICAL RECORD NO.:  [id number]          PATIENT TYPE:  INP   LOCATION:  1428                         FACILITY:  [facility]   PHYSICIAN:  Hillery Aldo, M.D.   DATE OF BIRTH:  [birth year]   DATE OF ADMISSION:  08/12/2008  DATE OF DISCHARGE:  08/27/2008                               DISCHARGE SUMMARY   ADDENDUM:   PRIMARY CARE PHYSICIAN:  Dr. Audria Nine at [facility].   CARDIOLOGIST:  Madolyn Frieze. [name], MD, [facility]   ORTHOPEDIC SURGEON:  Nadara Mustard, MD.   FINAL DISCHARGE DIAGNOSES:  1. Recurrent cellulitis.  2. Methicillin-resistant Staphylococcus aureus abscess status post      incision and drainage.  3. Chronic atrial fibrillation.  4. Hypertension.  5. Diabetes mellitus.  6. Dyslipidemia.  7. Morbid obesity.  8. Anemia of chronic disease.  9. Acute renal insufficiency.  10.Hypokalemia.   DISCHARGE MEDICATIONS:  1. Doxycycline 100 mg p.o. b.i.d.  2. Lotrimin cream to feet bilaterally b.i.d.  3. Lovenox 150 mg subcutaneously q.12h. until INR therapeutic times 48      hours.  4. NovoLog 10 units subcutaneously q.a.m.  5. Potassium chloride 20 mEq p.o. b.i.d.  6. Simvastatin 40 mg p.o. nightly.  7. Coreg 25 mg p.o. b.i.d.  8. Lanoxin 0.25 mg p.o. daily.  9. Cardizem ER 240 mg p.o. daily.  10.Lasix 40 mg p.o. q.12h.  11.Hydrochlorothiazide 12.5 mg p.o. daily.  12.Percocet 5/325 one tablet q.4h. p.r.n. pain.  13.Lantus insulin 60 units subcutaneously nightly.  14.Lisinopril 40 mg p.o. daily.  15.Metformin 500 mg p.o. q.12h.  16.Metoprolol XL 200 mg p.o. daily.  17.Protonix 40 mg p.o. daily.  18.Senokot-S one tablet p.o. nightly.  19.Coumadin 7.5 mg p.o. daily or as directed by the [facility] Clinic.   CONSULTATIONS:  Claude Manges. Cleophas Dunker, M.D. and Nadara Mustard, MD of  orthopedic surgery.   BRIEF ADMISSION HPI:  The patient is a 60 year old male who was treated  for a bout of left lower extremity cellulitis  back in the end of  January.  He was discharged on a course of Augmentin after being treated  with IV Unasyn and vancomycin.  He returned to the hospital due to non-  resolution of symptoms and worsening lower extremity pain and erythema.  For the full details, please see the dictated report done by Ladell Pier, M.D.   For complete list of the procedures and diagnostic studies and hospital  course by problem through August 16, 2008, please see my previously  dictated interim discharge summary/ progress note, job number [id number].   PROCEDURES AND DIAGNOSTIC STUDIES:  August 16, 2008 through August 27, 2008:  1. MRI of the left lower extremity done on August 19, 2008, showed      marked cellulitis in the dorsum of the foot with a large      subcutaneous abscess.  2. Irrigation and debridement of deep abscess of the left foot on      August 23, 2008 done by Nadara Mustard, MD.   DISCHARGE LABORATORY VALUES:  Sodium was 138, potassium  3 (repleted with  80 mEq prior to discharge), chloride 102, bicarb 28, BUN 7, creatinine  0.96, glucose 114.  PT was 21.3, INR 1.7.  White blood cell count was  10.1, hemoglobin 9.8, hematocrit 29.5, platelets 310.  Abscess cultures  grew coagulase-negative staph.   REMAINDER OF HOSPITAL COURSE BY PROBLEM:  1. Recurrent cellulitis/coagulase-negative staphylococcal abscess:      The patient had difficulty getting an MRI obtained due to the      weight restrictions on the machines here.  We were able to get an      MRI scan completed out of the hospital on August 19, 2008, with      the findings as noted above.  Orthopedic surgery was reconsulted      and the patient underwent irrigation and debridement of the deep      abscess of his left foot on August 23, 2008.  The VAC wound      dressing was applied after the procedure was done.  He continued to      have wound care done at the direction of the orthopedic surgeons.      The VAC was  subsequently removed and a dry dressing placed.  At      this time, the patient's wound is healing.  We will continue him on      doxycycline for 4 weeks at the recommendations of Nadara Mustard,      MD.  We will set up home health care nursing to help with his wound      care.  2. Chronic atrial fibrillation:  The patient is currently rate-      controlled and in normal sinus rhythm.  He is on Coumadin with a      Lovenox bridge.  He had to have his Coumadin reversed prior to his      surgery.  We are still awaiting therapeutic INR before      discontinuing the Lovenox.  The patient will follow up at the      [facility]madin Clinic through [facility].  A message has been      left with the Coumadin Clinic there praising them of the patient's      discharge today.  We will set up home health nursing to draw daily      PT/INR and call this to the Coumadin Clinic there so that his      Coumadin can be adjusted as needed based on his INR values.  3. Hypertension:  The patient's blood pressure has been reasonably      well-controlled on the regimen as outlined above.  .  4. Type 2 diabetes:  The patient has had ongoing up-titration of his      insulin.  At this time, he appears to be reasonably well-controlled      with a combination of 60 units of Lantus and 10 units of NovoLog      before meals.  He will follow up at [facility] for further      diabetic care.  5. Dyslipidemia:  The patient continued to receive statin therapy.  6. Morbid obesity:  The patient has been evaluated by the dietician      and has had ongoing teaching with regard to the need for weight      loss and diet restrictions related to his diabetes and obesity.  7. Anemia of chronic disease:  The patient does have a normocytic  anemia.  He had iron studies, as well as B12 and folic acid level      studies and the findings were consistent with anemia of chronic      disease.  8. Acute renal insufficiency:  The  patient's renal function is at his      baseline.  Discharge creatinine is 0.96.  9. Hypokalemia:  The patient has had recurrent problems with      hypokalemia related to diuretic use.  He was supplemented prior to      discharge and will be put on routine supplementation at discharge.   DISPOSITION:  The patient is medically stable for discharge.  He will  follow up with Dr. Audria Nine at [facility].  He has an appointment  already scheduled for the end of the month.  He will follow up with  Madolyn Frieze. [name], MD, [facility] and has been provided with Dr. Ludwig Clarks  number to call for an appointment time.  He will follow up at the  [facility] for ongoing Coumadin dosing.   Time spent coordinating care for discharge and discharge instructions  equals 40 minutes.      Hillery Aldo, M.D.  Electronically Signed     CR/MEDQ  D:  08/27/2008  T:  08/27/2008  Job:  [id number]   cc:   Madolyn Frieze. [name], MD, [facility]  [address]  [address]  [place] [address]   Nadara Mustard, MD  Fax: [phone]   Audria Nine, Dr.  Dala Dock

## 2010-11-24 NOTE — H&P (Signed)
NAME:  AZEEZ, DUNKER NO.:  [id number]   MEDICAL RECORD NO.:  [id number]          PATIENT TYPE:  EMS   LOCATION:  ED                           FACILITY:  [facility]   PHYSICIAN:  Ladell Pier, M.D.   DATE OF BIRTH:  Dec 23, 1950   DATE OF ADMISSION:  08/12/2008  DATE OF DISCHARGE:                              HISTORY & PHYSICAL   CHIEF COMPLAINT:  Cellulitis of the left lower extremity has not been  improving.   HISTORY OF PRESENT ILLNESS:  The patient is a 60 year old white male  that was discharged from the hospital on August 07, 2008.  He was  admitted with cellulitis of his left lower extremity.  He has a past  medical history significant for new onset atrial fibrillation that  hospitalization.  He also has morbid obesity, diabetes, high cholesterol  and hypertension.  The patient stated that he initially was diagnosed  with cellulitis of the lower extremity in [place].  He was on  Keflex, was not improving, came to the hospital, was treated with IV  Unasyn and vancomycin with some improvement.  Was discharged on  Augmentin and since discharge noted that the redness and tenderness and  warmth [place] worsening.  He has no other symptoms.  He has had no fevers or  chills.  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  He has no other complaints.   PAST MEDICAL HISTORY:  1. Diabetes type 2.  2. New onset atrial fibrillation.  3. Morbid obesity.  4. Hyperlipidemia.  5. Hypertension.   PAST SURGICAL HISTORY:  1. 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  2. PE.  3. Tonsillectomy and adenoidectomy.  4. History perianal abscess, status post drainage.   FAMILY HISTORY:  Noncontributory.   SOCIAL HISTORY:  The patient is single.  He has one child.  He is a  [employer] architect but has not been able to work since he was restarted on  insulin for  his diabetes.  He does not smoke tobacco or use alcohol or  IV drugs.   MEDICATIONS:  He has not been taking the medications that he was  discharged on except for the antibiotic.  He has gone back to taking his  old medications which is:  1. Zocor 40 mg at bedtime.  2. Metoprolol 25 mg twice daily.  3. Humulin R sliding scale.  4. HCTZ 25 mg twice daily.  5. Lisinopril 40 mg daily.   DISCHARGE MEDICATIONS:  Please see discharge summary of January 29.   ALLERGIES:  No known drug allergies.   REVIEW OF SYSTEMS:  Negative.  Otherwise stated in HPI.   PHYSICAL EXAMINATION:  VITAL SIGNS:  Temperature 97.3, blood pressure  109/69. pulse of 116, respirations 20, pulse oximetry 94%.  GENERAL:  Patient is sitting up on the stretcher.  Does not seem to be in any  acute distress.  HEENT:  Head is normocephalic, atraumatic.  Pupils reactive  to light.  Throat without erythema.  CARDIOVASCULAR:  He has atrial fibrillation  with mild RPR.  LUNGS:  Clear bilaterally.  ABDOMEN:  Obese.  Positive bowel sounds.  EXTREMITIES:  With maybe 1+ edema, 2+ DP pulses bilaterally.  He does  have increased warmth and erythema on the dorsum of the left lower  extremity with some pain in the way of the skin.   LABORATORY DATA:  X-ray of the foot showed extensive tissue swelling  over the dorsum of the lateral aspect of the foot, most compatible with  cellulitis.  WBC 10.3, hemoglobin 11.4, MCV 78.5, platelet count  415,000.  Sodium 140, potassium 3.7, chloride 108, CO2 24, glucose 115,  BUN 11, creatinine 1.12.   ASSESSMENT/PLAN:  1. Cellulitis of the left lower extremity.  2. Diabetes.  3. High cholesterol.  4. Hypertension.  5. Morbid obesity.   PLAN:  Will admit the patient to the hospital.  Put him back on IV  vancomycin and Zosyn.  Get blood cultures x2. Get wound care consult.  Will also continue on his home medications will get MRI of the foot to  rule out osteo.  He may need infectious  disease consult as this has been  going on for quite some time.      Ladell Pier, M.D.  Electronically Signed     NJ/MEDQ  D:  08/12/2008  T:  08/12/2008  Job:  [id number]

## 2011-02-10 HISTORY — PX: CORONARY ANGIOPLASTY WITH STENT PLACEMENT: SHX49

## 2011-05-19 ENCOUNTER — Encounter: Payer: Self-pay | Admitting: *Deleted

## 2011-05-19 ENCOUNTER — Emergency Department (INDEPENDENT_AMBULATORY_CARE_PROVIDER_SITE_OTHER)
Admission: EM | Admit: 2011-05-19 | Discharge: 2011-05-19 | Disposition: A | Discharge status: Home / Self Care | Payer: Medicare Other | Source: Home / Self Care | Attending: Emergency Medicine | Admitting: Emergency Medicine

## 2011-05-19 ENCOUNTER — Emergency Department (INDEPENDENT_AMBULATORY_CARE_PROVIDER_SITE_OTHER): Payer: Medicare Other

## 2011-05-19 DIAGNOSIS — S20219A Contusion of unspecified front wall of thorax, initial encounter: Secondary | ICD-10-CM

## 2011-05-19 HISTORY — DX: Essential (primary) hypertension: I10

## 2011-05-19 HISTORY — DX: Polyneuropathy, unspecified: G62.9

## 2011-05-19 MED ORDER — OXYCODONE-ACETAMINOPHEN 5-325 MG PO TABS: 1.0000 | ORAL_TABLET | ORAL | Status: AC | PRN

## 2011-05-19 NOTE — ED Notes (Signed)
MD at bedside. 

## 2011-05-19 NOTE — ED Provider Notes (Signed)
History     CSN: [id number] Arrival date & time: 05/19/2011  9:12 AM   First MD Initiated Contact with Patient 05/19/11 [phone]      Chief Complaint  Patient presents with  . Rib Injury    per pt moving furniture last night leg gave out and pt fell hitting left anterior rib area on corner arm of couch  tender to palpation and movement     (Consider location/radiation/quality/duration/timing/severity/associated sxs/prior treatment) HPI Comments: Was helping to move furniture..when his leg gave out and fell hitting the arm of sofa with his R ribcage, its been sore and tender since then  The history is provided by the patient.    Past Medical History  Diagnosis Date  . Diabetes mellitus   . Hypertension   . Rheumatoid arthritis   . Neuropathy     Past Surgical History  Procedure Date  . Abdominal surgery   . Debridement  foot     History reviewed. No pertinent family history.  History  Substance Use Topics  . Smoking status: Never Smoker   . Smokeless tobacco: Not on file  . Alcohol Use: No      Review of Systems  Constitutional: Negative for fever and diaphoresis.  Respiratory: Negative for shortness of breath.   Skin: Negative for color change.    Allergies  Review of patient's allergies indicates no known allergies.  Home Medications   Current Outpatient Rx  Name Route Sig Dispense Refill  . AMIODARONE HCL 200 MG PO TABS Oral Take 1 tablet (200 mg total) by mouth daily. 30 tablet 11  . FUROSEMIDE 40 MG PO TABS Oral Take 1 tablet (40 mg total) by mouth 2 (two) times daily. 60 tablet 11  . INSULIN ASPART 100 UNIT/ML [place] SOLN Subcutaneous Inject into the skin 3 (three) times daily before meals.      . INSULIN GLARGINE 100 UNIT/ML [place] SOLN Subcutaneous Inject 70 Units into the skin at bedtime.      [name] LISINOPRIL 20 MG PO TABS Oral Take by mouth daily.      . MORPHINE SULFATE 200 MG PO TB12 Oral Take by mouth 2 (two) times daily.      [name] POTASSIUM CHLORIDE CRYS CR  20 MEQ PO TBCR Oral Take 1 tablet (20 mEq total) by mouth 2 (two) times daily. 60 tablet 11    BP 129/63  Pulse 81  Temp(Src) 98.8 F (37.1 C) (Oral)  Resp 18  SpO2 97%  Physical Exam  Constitutional: No distress.  Pulmonary/Chest: He exhibits tenderness and bony tenderness. He exhibits no laceration, no crepitus, no deformity and no swelling.    Abdominal: Soft.  Musculoskeletal: He exhibits tenderness. He exhibits no edema.  Skin: Skin is warm. No abrasion, no bruising, no ecchymosis and no laceration noted.    ED Course  Procedures (including critical care time)  Labs Reviewed - No data to display Dg Chest 2 View  05/19/2011  *RADIOLOGY REPORT*  Clinical Data: Left pain post fall.  CHEST - 2 VIEW  Comparison: 10/08/2009  Findings: No pneumothorax.  Heart size upper limits normal.  Low lung volumes with resultant crowding of bronchovascular structures most marked at the lung bases.  No confluent airspace infiltrate. No effusion.  Regional bones unremarkable.  IMPRESSION:  1.  No acute abnormality.  Original Report Authenticated By: Osa Craver, M.D.     No diagnosis found.    MDM  Chest wall contusion  Jimmie Molly, MD 05/19/11 [phone]

## 2011-06-11 ENCOUNTER — Ambulatory Visit: Payer: Self-pay | Admitting: Cardiology

## 2011-06-14 ENCOUNTER — Encounter: Payer: Self-pay | Admitting: *Deleted

## 2011-06-14 NOTE — Telephone Encounter (Signed)
This encounter was created in error - please disregard.

## 2011-06-18 ENCOUNTER — Other Ambulatory Visit: Payer: Self-pay | Admitting: *Deleted

## 2011-06-18 MED ORDER — ROSUVASTATIN CALCIUM 10 MG PO TABS: 10.0000 mg | ORAL_TABLET | Freq: Every day | ORAL | Status: DC

## 2011-07-14 ENCOUNTER — Encounter: Payer: Self-pay | Admitting: Cardiology

## 2011-07-14 NOTE — Progress Notes (Signed)
   AVW:UJWJXBJY gentleman that I have  seen for atrial fibrillation. Transesophageal echocardiogram in November of 2010 revealed normal LV function, moderate left atrial enlargement, mild right atrial enlargement, possible left atrial appendage thrombus and mild mitral regurgitation.  A Myoview in May 2010  revealed fixed defect in the inferior wall with mild reversibility suggesting infarct with peri-infarct ischemia. Septal hypokinesis and apical dyskinesis. Left ventricular ejection fraction equal 51 %. We are treating this medically as the patient has no symptoms. He is also morbidly obese and diaphragmatic attenuation is a possibility. Note a TSH was low previously but free T3 and free T4 were normal. He has been followed by endocrinology for this. We scheduled a cardioversion which was performed on June 18, 2009. The procedure was successful. Since I last saw him in April of 2011,   Current Outpatient Prescriptions  Medication Sig Dispense Refill  . amiodarone (PACERONE) 200 MG tablet Take 1 tablet (200 mg total) by mouth daily.  30 tablet  11  . furosemide (LASIX) 40 MG tablet Take 1 tablet (40 mg total) by mouth 2 (two) times daily.  60 tablet  11  . insulin aspart (NOVOLOG) 100 UNIT/ML injection Inject into the skin 3 (three) times daily before meals.        . insulin glargine (LANTUS) 100 UNIT/ML injection Inject 70 Units into the skin at bedtime.        [name] lisinopril (PRINIVIL,ZESTRIL) 20 MG tablet Take by mouth daily.        . potassium chloride SA (KLOR-CON M20) 20 MEQ tablet Take 1 tablet (20 mEq total) by mouth 2 (two) times daily.  60 tablet  11  . rosuvastatin (CRESTOR) 10 MG tablet Take 1 tablet (10 mg total) by mouth daily.  30 tablet  12     Past Medical History  Diagnosis Date  . Diabetes mellitus   . Hypertension   . Rheumatoid arthritis   . Neuropathy     Past Surgical History  Procedure Date  . Abdominal surgery   . Debridement  foot     History   Social  History  . Marital Status: Single    Spouse Name: N/A    Number of Children: N/A  . Years of Education: N/A   Occupational History  . Not on file.   Social History Main Topics  . Smoking status: Never Smoker   . Smokeless tobacco: Not on file  . Alcohol Use: No  . Drug Use: No  . Sexually Active:    Other Topics Concern  . Not on file   Social History Narrative  . No narrative on file    ROS: no fevers or chills, productive cough, hemoptysis, dysphasia, odynophagia, melena, hematochezia, dysuria, hematuria, rash, seizure activity, orthopnea, PND, pedal edema, claudication. Remaining systems are negative.  Physical Exam: Well-developed well-nourished in no acute distress.  Skin is warm and dry.  HEENT is normal.  Neck is supple. No thyromegaly.  Chest is clear to auscultation with normal expansion.  Cardiovascular exam is regular rate and rhythm.  Abdominal exam nontender or distended. No masses palpated. Extremities show no edema. neuro grossly intact  ECG     This encounter was created in error - please disregard.

## 2011-08-04 ENCOUNTER — Encounter: Payer: Self-pay | Admitting: Cardiology

## 2011-08-04 ENCOUNTER — Ambulatory Visit (INDEPENDENT_AMBULATORY_CARE_PROVIDER_SITE_OTHER): Payer: Medicare Other | Admitting: Cardiology

## 2011-08-04 VITALS — BP 136/60 | HR 72 | Ht 72.0 in | Wt 389.0 lb

## 2011-08-04 DIAGNOSIS — I2510 Atherosclerotic heart disease of native coronary artery without angina pectoris: Secondary | ICD-10-CM

## 2011-08-04 DIAGNOSIS — E785 Hyperlipidemia, unspecified: Secondary | ICD-10-CM

## 2011-08-04 DIAGNOSIS — I4891 Unspecified atrial fibrillation: Secondary | ICD-10-CM

## 2011-08-04 DIAGNOSIS — I5040 Unspecified combined systolic (congestive) and diastolic (congestive) heart failure: Secondary | ICD-10-CM

## 2011-08-04 DIAGNOSIS — R0602 Shortness of breath: Secondary | ICD-10-CM

## 2011-08-04 DIAGNOSIS — I10 Essential (primary) hypertension: Secondary | ICD-10-CM

## 2011-08-04 DIAGNOSIS — G4733 Obstructive sleep apnea (adult) (pediatric): Secondary | ICD-10-CM | POA: Insufficient documentation

## 2011-08-04 DIAGNOSIS — Z9861 Coronary angioplasty status: Secondary | ICD-10-CM | POA: Insufficient documentation

## 2011-08-04 MED ORDER — POTASSIUM CHLORIDE CRYS ER 20 MEQ PO TBCR: 20.0000 meq | EXTENDED_RELEASE_TABLET | Freq: Two times a day (BID) | ORAL | Status: DC

## 2011-08-04 NOTE — Assessment & Plan Note (Signed)
Counseled on weight loss; considering bariatric surgery.

## 2011-08-04 NOTE — Assessment & Plan Note (Signed)
Continue aspirin, Plavix and statin. Obtain records from [facility].

## 2011-08-04 NOTE — Assessment & Plan Note (Signed)
Referred to pulmonary for CPAP.

## 2011-08-04 NOTE — Assessment & Plan Note (Signed)
Patient remains in sinus rhythm. Continue amiodarone. Check liver functions and TSH. Chest x-ray in November normal. Continue aspirin and Plavix given recent stent. He will ultimately need to have Coumadin resumed. Will await outside records concerning timing of recent stent and whether or it was drug-eluting to understand when to discontinue antiplatelet therapy.

## 2011-08-04 NOTE — Progress Notes (Signed)
YNW:GNFAOZHY gentleman that I have  seen for atrial fibrillation. Transesophageal echocardiogram in November of 2010 revealed normal LV function, moderate left atrial enlargement, mild right atrial enlargement, possible left atrial appendage thrombus and mild mitral regurgitation.  A Myoview in May 2010  revealed fixed defect in the inferior wall with mild reversibility suggesting infarct with peri-infarct ischemia. Septal hypokinesis and apical dyskinesis. Left ventricular ejection fraction equal 51 %. We are treating this medically as the patient has no symptoms. He is also morbidly obese and diaphragmatic attenuation is a possibility. Note a TSH was low but her free T3 and free T4 were normal. He has been followed by endocrinology for this. We scheduled a cardioversion which was performed on June 18, 2009. The procedure was successful. I last saw him in April of 2011. Since then, the patient apparently had an abnormal nuclear study followed by stent placement in [place]. This was approximately 6 months ago by his report. Records not available. Patient does describe increased dyspnea on exertion. No orthopnea or PND. Mild pedal edema. No chest pain, palpitations or syncope.  Current Outpatient Prescriptions  Medication Sig Dispense Refill  . amiodarone (PACERONE) 200 MG tablet Take 1 tablet (200 mg total) by mouth daily.  30 tablet  11  . aspirin 81 MG tablet Take 160 mg by mouth daily.      . clopidogrel (PLAVIX) 75 MG tablet Take 75 mg by mouth daily.      . furosemide (LASIX) 40 MG tablet Take 1 tablet (40 mg total) by mouth 2 (two) times daily.  60 tablet  11  . glyBURIDE-metformin (GLUCOVANCE) 5-500 MG per tablet Take 1 tablet by mouth 2 (two) times daily with a meal.      . insulin aspart (NOVOLOG) 100 UNIT/ML injection Inject into the skin 3 (three) times daily before meals.        . insulin glargine (LANTUS) 100 UNIT/ML injection Inject 70 Units into the skin at bedtime.        [name]  lisinopril-hydrochlorothiazide (PRINZIDE,ZESTORETIC) 20-25 MG per tablet Take 1 tablet by mouth daily.      . metoprolol succinate (TOPROL-XL) 100 MG 24 hr tablet Take 100 mg by mouth daily. Take with or immediately following a meal.      . Multiple Vitamins-Minerals (EYE VITAMINS PO) Take 1 tablet by mouth daily.      . multivitamin (THERAGRAN) per tablet Take 1 tablet by mouth daily.      [name] oxycodone (ROXICODONE) 30 MG immediate release tablet Take 30 mg by mouth every 4 (four) hours as needed.      . potassium chloride SA (KLOR-CON M20) 20 MEQ tablet Take 1 tablet (20 mEq total) by mouth 2 (two) times daily.  60 tablet  11  . rosuvastatin (CRESTOR) 10 MG tablet Take 1 tablet (10 mg total) by mouth daily.  30 tablet  12     Past Medical History  Diagnosis Date  . Diabetes mellitus   . Hypertension   . Rheumatoid arthritis   . Neuropathy   . Atrial fibrillation   . CHF (congestive heart failure)   . History of MI (myocardial infarction)     Past Surgical History  Procedure Date  . Abdominal surgery   . Debridement  foot   . Coronary angioplasty with stent placement     History   Social History  . Marital Status: Single    Spouse Name: N/A    Number of Children: N/A  .  Years of Education: N/A   Occupational History  . Not on file.   Social History Main Topics  . Smoking status: Never Smoker   . Smokeless tobacco: Not on file  . Alcohol Use: No  . Drug Use: No  . Sexually Active:    Other Topics Concern  . Not on file   Social History Narrative  . No narrative on file    ROS: no fevers or chills, productive cough, hemoptysis, dysphasia, odynophagia, melena, hematochezia, dysuria, hematuria, rash, seizure activity, orthopnea, PND, claudication. Remaining systems are negative.  Physical Exam: Well-developed morbidly obese in no acute distress.  Skin is warm and dry.  HEENT is normal.  Neck is supple. No thyromegaly.  Chest is clear to auscultation with normal  expansion.  Cardiovascular exam is regular rate and rhythm.  Abdominal exam nontender or distended. No masses palpated. Extremities show trace edema. neuro grossly intact  ECG normal sinus rhythm at a rate of 72. Nonspecific ST changes.

## 2011-08-04 NOTE — Assessment & Plan Note (Signed)
Continue statin. Check lipids

## 2011-08-04 NOTE — Assessment & Plan Note (Signed)
Continue present dose of Lasix. Check potassium and renal function. Repeat echocardiogram. 

## 2011-08-04 NOTE — Assessment & Plan Note (Signed)
Blood pressure controlled. Continue present medications. 

## 2011-08-04 NOTE — Patient Instructions (Signed)
Your physician recommends that you schedule a follow-up appointment in: 3 MONTHS  Your physician has requested that you have an echocardiogram. Echocardiography is a painless test that uses sound waves to create images of your heart. It provides your doctor with information about the size and shape of your heart and how well your heart's chambers and valves are working. This procedure takes approximately one hour. There are no restrictions for this procedure.   Your physician recommends that you return for lab work in: TODAY  REFERRAL TO PULMONARY FOR SLEEP APNEA  NEEDS TO ESTABLISH WITH PCP

## 2011-08-06 ENCOUNTER — Telehealth: Payer: Self-pay | Admitting: Cardiology

## 2011-08-06 NOTE — Telephone Encounter (Addendum)
ROI faxed to [facility] @ [phone] They called back stating they have No Records on this Pt. 08/06/11/KM  Marcelino Duster called back from [place] they found Records on this Pt,she will fax over 08/06/11/KM Records Received from [place] ,gave to Prien 08/06/11/km

## 2011-08-10 ENCOUNTER — Other Ambulatory Visit (HOSPITAL_COMMUNITY): Payer: Medicare Other | Admitting: Radiology

## 2011-08-12 ENCOUNTER — Other Ambulatory Visit (HOSPITAL_COMMUNITY): Payer: Medicare Other | Admitting: Radiology

## 2011-08-12 ENCOUNTER — Other Ambulatory Visit: Payer: Medicare Other | Admitting: *Deleted

## 2011-08-17 ENCOUNTER — Other Ambulatory Visit (HOSPITAL_COMMUNITY): Payer: Self-pay | Admitting: Radiology

## 2011-08-17 ENCOUNTER — Ambulatory Visit (HOSPITAL_COMMUNITY): Payer: Medicare Other | Attending: Cardiology | Admitting: Radiology

## 2011-08-17 DIAGNOSIS — E119 Type 2 diabetes mellitus without complications: Secondary | ICD-10-CM | POA: Insufficient documentation

## 2011-08-17 DIAGNOSIS — I4891 Unspecified atrial fibrillation: Secondary | ICD-10-CM | POA: Insufficient documentation

## 2011-08-17 DIAGNOSIS — I10 Essential (primary) hypertension: Secondary | ICD-10-CM | POA: Insufficient documentation

## 2011-08-17 DIAGNOSIS — E785 Hyperlipidemia, unspecified: Secondary | ICD-10-CM | POA: Insufficient documentation

## 2011-08-17 DIAGNOSIS — R0602 Shortness of breath: Secondary | ICD-10-CM

## 2011-08-17 DIAGNOSIS — I2510 Atherosclerotic heart disease of native coronary artery without angina pectoris: Secondary | ICD-10-CM | POA: Insufficient documentation

## 2011-08-17 DIAGNOSIS — I509 Heart failure, unspecified: Secondary | ICD-10-CM | POA: Insufficient documentation

## 2011-08-17 MED ORDER — PERFLUTREN PROTEIN A MICROSPH IV SUSP
0.5000 mL | Freq: Once | INTRAVENOUS | Status: AC
  Administered 2011-08-17: 0.5 mL via INTRAVENOUS

## 2011-08-18 ENCOUNTER — Institutional Professional Consult (permissible substitution): Payer: Medicare Other | Admitting: Pulmonary Disease

## 2011-08-18 ENCOUNTER — Telehealth: Payer: Self-pay | Admitting: Cardiology

## 2011-08-18 NOTE — Telephone Encounter (Signed)
Walk In Pt Form " Pt Dropped Off Medical Clearance Form From [facility]" please complete Call Pt When Ready 08/18/11/KM

## 2011-08-20 ENCOUNTER — Telehealth: Payer: Self-pay | Admitting: Cardiology

## 2011-08-20 ENCOUNTER — Other Ambulatory Visit: Payer: Medicare Other | Admitting: *Deleted

## 2011-08-20 NOTE — Telephone Encounter (Signed)
Left message will forward to Debra RN for Dr [name], didn't see with her things

## 2011-08-20 NOTE — Telephone Encounter (Signed)
New Problem   Patient request return phone call from nurse regarding medical clearance form for his dentist appnt.  Patient can be reached at hm#

## 2011-08-24 NOTE — Telephone Encounter (Signed)
Spoke with pt, i have the dentist paperwork and pt made aware dr [name] not here this week. Will call pt when papers ready.

## 2011-08-24 NOTE — Telephone Encounter (Signed)
Pt requested to have a HgbA1C drawn with his other labs for his PCP. Order placed and appt made.

## 2011-08-24 NOTE — Telephone Encounter (Signed)
Addended by: Freddi Starr on: 08/24/2011 03:46 PM   Modules accepted: Orders

## 2011-08-31 ENCOUNTER — Other Ambulatory Visit (INDEPENDENT_AMBULATORY_CARE_PROVIDER_SITE_OTHER): Payer: Medicare Other

## 2011-08-31 DIAGNOSIS — R0602 Shortness of breath: Secondary | ICD-10-CM

## 2011-08-31 DIAGNOSIS — E119 Type 2 diabetes mellitus without complications: Secondary | ICD-10-CM

## 2011-08-31 LAB — LIPID PANEL
Cholesterol: 143 mg/dL (ref 0–200)
Total CHOL/HDL Ratio: 4
Triglycerides: 212 mg/dL — ABNORMAL HIGH (ref 0.0–149.0)
VLDL: 42.4 mg/dL — ABNORMAL HIGH (ref 0.0–40.0)

## 2011-08-31 LAB — BASIC METABOLIC PANEL
Calcium: 8.7 mg/dL (ref 8.4–10.5)
Creatinine, Ser: 1.7 mg/dL — ABNORMAL HIGH (ref 0.4–1.5)

## 2011-08-31 LAB — CBC WITH DIFFERENTIAL/PLATELET
Basophils Relative: 0.2 % (ref 0.0–3.0)
Eosinophils Relative: 3.1 % (ref 0.0–5.0)
Lymphocytes Relative: 23.3 % (ref 12.0–46.0)
Neutrophils Relative %: 65.8 % (ref 43.0–77.0)
RBC: 3.78 Mil/uL — ABNORMAL LOW (ref 4.22–5.81)
WBC: 14.4 10*3/uL — ABNORMAL HIGH (ref 4.5–10.5)

## 2011-08-31 LAB — HEPATIC FUNCTION PANEL
ALT: 29 U/L (ref 0–53)
Bilirubin, Direct: 0 mg/dL (ref 0.0–0.3)
Total Bilirubin: 0.2 mg/dL — ABNORMAL LOW (ref 0.3–1.2)

## 2011-08-31 LAB — LDL CHOLESTEROL, DIRECT: Direct LDL: 82.1 mg/dL

## 2011-09-06 ENCOUNTER — Institutional Professional Consult (permissible substitution): Payer: Medicare Other | Admitting: Pulmonary Disease

## 2011-09-23 ENCOUNTER — Ambulatory Visit (INDEPENDENT_AMBULATORY_CARE_PROVIDER_SITE_OTHER): Payer: Medicare Other | Admitting: Pulmonary Disease

## 2011-09-23 ENCOUNTER — Encounter: Payer: Self-pay | Admitting: Pulmonary Disease

## 2011-09-23 VITALS — BP 142/78 | HR 82 | Temp 97.8°F | Ht 72.0 in | Wt >= 6400 oz

## 2011-09-23 DIAGNOSIS — G4733 Obstructive sleep apnea (adult) (pediatric): Secondary | ICD-10-CM

## 2011-09-23 NOTE — Progress Notes (Signed)
  Subjective:    Patient ID: [name], male    DOB: February 06, 1951, 61 y.o.   MRN: [id number]  HPI The patient is a 61 year old male who I was asked to see for obstructive sleep apnea.  He apparently had a sleep study last year that showed severe sleep apnea, however the results are not available for my review.  He was not started on CPAP because he was moving to this area, and he has significant underlying cardiac disease that puts him at risk.  The patient states that he is a loud snorer, and has also been told that he has an abnormal breathing pattern during sleep.  He is not rested in the mornings upon arising, and has significant sleep pressure during the day with periods of inactivity.  He denies any sleepiness with driving.  The patient states that his weight is up 75 pounds over the last one year, and his Epworth sleepiness score today is 13.  Sleep Questionnaire: What time do you typically go to bed?( Between what hours) 4:00 am How long does it take you to fall asleep? not long How many times during the night do you wake up? 4 What time do you get out of bed to start your day? 0800 Do you drive or operate heavy machinery in your occupation? No How much has your weight changed (up or down) over the past two years? (In pounds) 25 lb (11.34 kg) Have you ever had a sleep study before? Yes If yes, location of study? [place], [place] If yes, date of study? 2012 Do you currently use CPAP? No Do you wear oxygen at any time? No    Review of Systems  Constitutional: Positive for unexpected weight change. Negative for fever.  HENT: Positive for congestion and dental problem. Negative for ear pain, nosebleeds, sore throat, rhinorrhea, sneezing, trouble swallowing, postnasal drip and sinus pressure.   Eyes: Negative for redness and itching.  Respiratory: Positive for shortness of breath. Negative for cough, chest tightness and wheezing.   Cardiovascular: Positive for palpitations and leg swelling.    Gastrointestinal: Negative for nausea and vomiting.  Genitourinary: Negative for dysuria.  Musculoskeletal: Positive for joint swelling.  Skin: Negative for rash.  Neurological: Negative for headaches.  Hematological: Does not bruise/bleed easily.  Psychiatric/Behavioral: Negative for dysphoric mood. The patient is not nervous/anxious.        Objective:   Physical Exam Constitutional:  Morbidly obese male, no acute distress  HENT:  Nares patent without discharge, large turbinates and septal deviation to left  Oropharynx without exudate, palate and uvula are thick and elongated.   Eyes:  Perrla, eomi, no scleral icterus  Neck:  No JVD, no TMG  Cardiovascular:  Normal rate, regular rhythm, no rubs or gallops.  No murmurs        Intact distal pulses  Pulmonary :  Normal breath sounds, no stridor or respiratory distress   No rales, rhonchi, or wheezing  Abdominal:  Soft, nondistended, bowel sounds present.  No tenderness noted.   Musculoskeletal:  2+lower extremity edema noted.  Lymph Nodes:  No cervical lymphadenopathy noted  Skin:  No cyanosis noted  Neurologic:  Sleepy, appropriate, moves all 4 extremities without obvious deficit.         Assessment & Plan:

## 2011-09-23 NOTE — Patient Instructions (Signed)
Once we receive your sleep study, will set you up on cpap at a moderate level.  Please call if you are having issues with tolerance. Work on weight loss followup with me in 5-6 weeks.

## 2011-09-23 NOTE — Assessment & Plan Note (Signed)
The patient has a history of severe obstructive sleep apnea, and has not been started on treatment because of his recent move to the [place] area.  I do not have his sleep study available, but will begin him on treatment once I receive this.  I had a long discussion with him about sleep apnea, including its impact to his cardiovascular health and quality of life. I will set the patient up on cpap at a moderate pressure level to allow for desensitization, and will troubleshoot the device over the next 4-6weeks if needed.  The pt is to call me if having issues with tolerance.  Will then optimize the pressure once patient is able to wear cpap on a consistent basis.

## 2011-10-19 ENCOUNTER — Ambulatory Visit: Payer: Medicare Other | Admitting: Cardiology

## 2011-11-04 ENCOUNTER — Other Ambulatory Visit: Payer: Self-pay | Admitting: *Deleted

## 2011-11-04 DIAGNOSIS — I739 Peripheral vascular disease, unspecified: Secondary | ICD-10-CM

## 2011-11-05 ENCOUNTER — Encounter (INDEPENDENT_AMBULATORY_CARE_PROVIDER_SITE_OTHER): Payer: Medicare Other

## 2011-11-05 DIAGNOSIS — I739 Peripheral vascular disease, unspecified: Secondary | ICD-10-CM

## 2011-11-05 DIAGNOSIS — E1159 Type 2 diabetes mellitus with other circulatory complications: Secondary | ICD-10-CM

## 2011-11-12 ENCOUNTER — Encounter: Payer: Self-pay | Admitting: Cardiology

## 2011-11-12 ENCOUNTER — Ambulatory Visit (INDEPENDENT_AMBULATORY_CARE_PROVIDER_SITE_OTHER): Payer: Medicare Other | Admitting: Cardiology

## 2011-11-12 VITALS — BP 150/67 | HR 80 | Ht 72.0 in | Wt >= 6400 oz

## 2011-11-12 DIAGNOSIS — I5040 Unspecified combined systolic (congestive) and diastolic (congestive) heart failure: Secondary | ICD-10-CM

## 2011-11-12 DIAGNOSIS — I359 Nonrheumatic aortic valve disorder, unspecified: Secondary | ICD-10-CM

## 2011-11-12 DIAGNOSIS — Z952 Presence of prosthetic heart valve: Secondary | ICD-10-CM

## 2011-11-12 DIAGNOSIS — D649 Anemia, unspecified: Secondary | ICD-10-CM

## 2011-11-12 DIAGNOSIS — I509 Heart failure, unspecified: Secondary | ICD-10-CM

## 2011-11-12 DIAGNOSIS — E785 Hyperlipidemia, unspecified: Secondary | ICD-10-CM

## 2011-11-12 DIAGNOSIS — I10 Essential (primary) hypertension: Secondary | ICD-10-CM

## 2011-11-12 LAB — BASIC METABOLIC PANEL
CO2: 26 mEq/L (ref 19–32)
Calcium: 9.3 mg/dL (ref 8.4–10.5)
Chloride: 102 mEq/L (ref 96–112)
Glucose, Bld: 205 mg/dL — ABNORMAL HIGH (ref 70–99)
Sodium: 139 mEq/L (ref 135–145)

## 2011-11-12 LAB — CBC WITH DIFFERENTIAL/PLATELET
Eosinophils Relative: 3.3 % (ref 0.0–5.0)
HCT: 32.7 % — ABNORMAL LOW (ref 39.0–52.0)
Hemoglobin: 10.9 g/dL — ABNORMAL LOW (ref 13.0–17.0)
Lymphs Abs: 2.1 10*3/uL (ref 0.7–4.0)
Monocytes Relative: 5.9 % (ref 3.0–12.0)
Neutro Abs: 8.1 10*3/uL — ABNORMAL HIGH (ref 1.4–7.7)
Platelets: 242 10*3/uL (ref 150.0–400.0)
WBC: 11.2 10*3/uL — ABNORMAL HIGH (ref 4.5–10.5)

## 2011-11-12 MED ORDER — WARFARIN SODIUM 5 MG PO TABS: ORAL_TABLET | ORAL | Status: DC

## 2011-11-12 MED ORDER — METOPROLOL SUCCINATE ER 25 MG PO TB24: 25.0000 mg | ORAL_TABLET | Freq: Every day | ORAL | Status: DC

## 2011-11-12 NOTE — Assessment & Plan Note (Signed)
Patient remains in sinus rhythm. Continue amiodarone. Recent liver functions and TSH normal. Check chest x-ray. Resume Coumadin with goal INR 2-3 and followup in the Coumadin clinic.

## 2011-11-12 NOTE — Assessment & Plan Note (Signed)
Continue present dose of diuretics. Check potassium and renal function. 

## 2011-11-12 NOTE — Assessment & Plan Note (Signed)
Followup with GI for possible colonoscopy.

## 2011-11-12 NOTE — Assessment & Plan Note (Signed)
Patient counseled on weight loss. He is scheduled to see someone at [facility] for this.

## 2011-11-12 NOTE — Assessment & Plan Note (Signed)
Blood pressure elevated. Add Toprol 25 mg daily. 

## 2011-11-12 NOTE — Progress Notes (Signed)
HPI: Pleasant gentleman that I have seen for atrial fibrillation and CAD. Transesophageal echocardiogram in November of 2010 revealed normal LV function, moderate left atrial enlargement, mild right atrial enlargement, possible left atrial appendage thrombus and mild mitral regurgitation. Note a TSH was low but her free T3 and free T4 were normal. He has been followed by endocrinology for this. We scheduled a cardioversion which was performed on June 18, 2009. The procedure was successful. Patient had PCI of his right coronary artery in August of 2012 in [place]. He had a drug-eluting stent. Last echocardiogram in February of 2013 showed normal LV function. The left atrium was mild to moderately dilated. ABIs in April of 2013 were unremarkable. LFTs in February of 2013 were normal. TSH 0.51. Since I last saw him in Jan 2013, he does have some dyspnea on exertion but no orthopnea, PND, chest pain or syncope. Mild pedal edema.   Current Outpatient Prescriptions  Medication Sig Dispense Refill  . aspirin 81 MG tablet Take 81 mg by mouth daily.       . clopidogrel (PLAVIX) 75 MG tablet Take 75 mg by mouth daily.      . Ergocalciferol (VITAMIN D2 PO) Take 50,000 Units by mouth every 7 (seven) days.      . furosemide (LASIX) 40 MG tablet Take 40 mg by mouth 2 (two) times daily.      [name] gabapentin (NEURONTIN) 300 MG capsule Take 300 mg by mouth 2 (two) times daily.      . insulin aspart (NOVOLOG) 100 UNIT/ML injection Inject into the skin 3 (three) times daily before meals.        . insulin glargine (LANTUS) 100 UNIT/ML injection Inject 70 Units into the skin at bedtime.        [name] lisinopril-hydrochlorothiazide (PRINZIDE,ZESTORETIC) 20-25 MG per tablet Take 1 tablet by mouth daily.      . Multiple Vitamins-Minerals (EYE VITAMINS PO) Take 1 tablet by mouth daily.      . multivitamin (THERAGRAN) per tablet Take 1 tablet by mouth daily.      [name] oxycodone (ROXICODONE) 30 MG immediate release tablet  Take 30 mg by mouth every 4 (four) hours as needed.      . potassium chloride SA (K-DUR,KLOR-CON) 20 MEQ tablet Take 20 mEq by mouth 2 (two) times daily.       . rosuvastatin (CRESTOR) 10 MG tablet Take 1 tablet (10 mg total) by mouth daily.  30 tablet  12  . spironolactone (ALDACTONE) 25 MG tablet Take 25 mg by mouth daily.      [name] amiodarone (PACERONE) 200 MG tablet Take 1 tablet (200 mg total) by mouth daily.  30 tablet  11  . furosemide (LASIX) 40 MG tablet Take 40 mg by mouth daily.         Past Medical History  Diagnosis Date  . Diabetes mellitus   . Hypertension   . Rheumatoid arthritis   . Neuropathy   . Atrial fibrillation   . CHF (congestive heart failure)   . History of MI (myocardial infarction)   . CAD (coronary artery disease)     Past Surgical History  Procedure Date  . Abdominal surgery   . Debridement  foot   . Coronary angioplasty with stent placement     History   Social History  . Marital Status: Single    Spouse Name: N/A    Number of Children: Y  . Years of Education: N/A   Occupational History  .  disabled truck driver    Social History Main Topics  . Smoking status: Former Smoker -- 1.0 packs/day for 20 years    Types: Cigarettes    Quit date: 07/13/1983  . Smokeless tobacco: Not on file  . Alcohol Use: No  . Drug Use: No  . Sexually Active: Not on file   Other Topics Concern  . Not on file   Social History Narrative   Pt is single.Lives with sister.Has children.Was adopted.    ROS: no fevers or chills, productive cough, hemoptysis, dysphasia, odynophagia, melena, hematochezia, dysuria, hematuria, rash, seizure activity, orthopnea, PND, claudication. Remaining systems are negative.  Physical Exam: Well-developed morbidly obese in no acute distress.  Skin is warm and dry.  HEENT is normal.  Neck is supple.  Chest is clear to auscultation with normal expansion.  Cardiovascular exam is regular rate and rhythm.  Abdominal exam  difficult due to obesity. Extremities show chronic skin changes and 1+ edema. neuro grossly intact  ECG sinus rhythm at a rate of 79. Nonspecific ST changes.

## 2011-11-12 NOTE — Assessment & Plan Note (Signed)
Continue statin. 

## 2011-11-12 NOTE — Patient Instructions (Addendum)
Your physician wants you to follow-up in: 6 MONTHS WITH DR [name] You will receive a reminder letter in the mail two months in advance. If you don't receive a letter, please call our office to schedule the follow-up appointment.   Your physician recommends that you HAVE LAB WORK TODAY  STOP ASPIRIN  STOP PLAVIX AT THE END OF AUGUST  RESTART WARFARIN SODIUM 5 MG ONCE DAILY  COUMADIN CLINIC VISIT IN ONE WEEK  A chest x-ray takes a picture of the organs and structures inside the chest, including the heart, lungs, and blood vessels. This test can show several things, including, whether the heart is enlarges; whether fluid is building up in the lungs; and whether pacemaker / defibrillator leads are still in place. AT [address]  REFERRAL TO GI FOR MACROCYTIC ANEMIA  START METOPROLOL SUCC 25 MG ONCE DAILY WITH FOOD

## 2011-11-12 NOTE — Assessment & Plan Note (Signed)
Patient had drug-eluting stent in August of 2012. Plan discontinue aspirin. Continue Plavix and statin. We are discontinuing his aspirin as we are resuming his Coumadin for his history of atrial fibrillation.

## 2011-11-18 ENCOUNTER — Ambulatory Visit (INDEPENDENT_AMBULATORY_CARE_PROVIDER_SITE_OTHER): Payer: Medicare Other | Admitting: *Deleted

## 2011-11-18 DIAGNOSIS — I4891 Unspecified atrial fibrillation: Secondary | ICD-10-CM

## 2011-11-18 DIAGNOSIS — Z7901 Long term (current) use of anticoagulants: Secondary | ICD-10-CM

## 2011-11-18 NOTE — Patient Instructions (Signed)

## 2011-11-25 ENCOUNTER — Ambulatory Visit (INDEPENDENT_AMBULATORY_CARE_PROVIDER_SITE_OTHER): Payer: Medicare Other | Admitting: *Deleted

## 2011-11-25 DIAGNOSIS — Z7901 Long term (current) use of anticoagulants: Secondary | ICD-10-CM

## 2011-11-25 DIAGNOSIS — I4891 Unspecified atrial fibrillation: Secondary | ICD-10-CM

## 2011-12-01 ENCOUNTER — Ambulatory Visit: Payer: Medicare Other | Admitting: Internal Medicine

## 2011-12-01 ENCOUNTER — Ambulatory Visit (INDEPENDENT_AMBULATORY_CARE_PROVIDER_SITE_OTHER): Payer: Medicare Other | Admitting: Pharmacist

## 2011-12-01 DIAGNOSIS — Z7901 Long term (current) use of anticoagulants: Secondary | ICD-10-CM

## 2011-12-01 DIAGNOSIS — I4891 Unspecified atrial fibrillation: Secondary | ICD-10-CM

## 2011-12-13 ENCOUNTER — Ambulatory Visit (INDEPENDENT_AMBULATORY_CARE_PROVIDER_SITE_OTHER): Payer: Medicare Other | Admitting: Pharmacist

## 2011-12-13 DIAGNOSIS — I4891 Unspecified atrial fibrillation: Secondary | ICD-10-CM

## 2011-12-13 DIAGNOSIS — Z7901 Long term (current) use of anticoagulants: Secondary | ICD-10-CM

## 2011-12-13 LAB — POCT INR: INR: 3.1

## 2011-12-20 ENCOUNTER — Other Ambulatory Visit: Payer: Self-pay | Admitting: Cardiology

## 2011-12-21 ENCOUNTER — Ambulatory Visit: Payer: Medicare Other | Admitting: Internal Medicine

## 2011-12-21 ENCOUNTER — Telehealth: Payer: Self-pay | Admitting: Internal Medicine

## 2011-12-23 ENCOUNTER — Encounter: Payer: Self-pay | Admitting: Cardiology

## 2011-12-27 ENCOUNTER — Ambulatory Visit (INDEPENDENT_AMBULATORY_CARE_PROVIDER_SITE_OTHER): Payer: Medicare Other | Admitting: *Deleted

## 2011-12-27 DIAGNOSIS — I4891 Unspecified atrial fibrillation: Secondary | ICD-10-CM

## 2011-12-27 DIAGNOSIS — Z7901 Long term (current) use of anticoagulants: Secondary | ICD-10-CM

## 2012-01-03 ENCOUNTER — Encounter: Payer: Self-pay | Admitting: *Deleted

## 2012-01-10 ENCOUNTER — Ambulatory Visit (INDEPENDENT_AMBULATORY_CARE_PROVIDER_SITE_OTHER): Payer: Medicare Other | Admitting: *Deleted

## 2012-01-10 DIAGNOSIS — Z7901 Long term (current) use of anticoagulants: Secondary | ICD-10-CM

## 2012-01-10 DIAGNOSIS — I4891 Unspecified atrial fibrillation: Secondary | ICD-10-CM

## 2012-01-13 ENCOUNTER — Other Ambulatory Visit: Payer: Self-pay | Admitting: Cardiology

## 2012-01-18 ENCOUNTER — Ambulatory Visit (INDEPENDENT_AMBULATORY_CARE_PROVIDER_SITE_OTHER)
Admission: RE | Admit: 2012-01-18 | Discharge: 2012-01-18 | Disposition: A | Discharge status: Home / Self Care | Payer: Medicare Other | Source: Ambulatory Visit | Attending: Cardiology | Admitting: Cardiology

## 2012-01-18 DIAGNOSIS — Z954 Presence of other heart-valve replacement: Secondary | ICD-10-CM

## 2012-01-18 DIAGNOSIS — Z952 Presence of prosthetic heart valve: Secondary | ICD-10-CM

## 2012-01-24 ENCOUNTER — Ambulatory Visit (INDEPENDENT_AMBULATORY_CARE_PROVIDER_SITE_OTHER): Payer: Medicare Other

## 2012-01-24 DIAGNOSIS — I4891 Unspecified atrial fibrillation: Secondary | ICD-10-CM

## 2012-01-24 DIAGNOSIS — Z7901 Long term (current) use of anticoagulants: Secondary | ICD-10-CM

## 2012-01-24 DIAGNOSIS — Z954 Presence of other heart-valve replacement: Secondary | ICD-10-CM

## 2012-01-24 DIAGNOSIS — Z952 Presence of prosthetic heart valve: Secondary | ICD-10-CM

## 2012-01-24 LAB — POCT INR: INR: 2.7

## 2012-01-24 MED ORDER — WARFARIN SODIUM 5 MG PO TABS: ORAL_TABLET | ORAL | Status: DC

## 2012-01-25 ENCOUNTER — Ambulatory Visit: Payer: Medicare Other | Admitting: Internal Medicine

## 2012-02-08 ENCOUNTER — Other Ambulatory Visit (INDEPENDENT_AMBULATORY_CARE_PROVIDER_SITE_OTHER): Payer: Medicare Other

## 2012-02-08 ENCOUNTER — Encounter: Payer: Self-pay | Admitting: Internal Medicine

## 2012-02-08 ENCOUNTER — Ambulatory Visit (INDEPENDENT_AMBULATORY_CARE_PROVIDER_SITE_OTHER): Payer: Medicare Other | Admitting: Internal Medicine

## 2012-02-08 VITALS — BP 158/70 | HR 76 | Ht 72.0 in | Wt >= 6400 oz

## 2012-02-08 DIAGNOSIS — K625 Hemorrhage of anus and rectum: Secondary | ICD-10-CM

## 2012-02-08 DIAGNOSIS — E119 Type 2 diabetes mellitus without complications: Secondary | ICD-10-CM

## 2012-02-08 DIAGNOSIS — Z79899 Other long term (current) drug therapy: Secondary | ICD-10-CM

## 2012-02-08 DIAGNOSIS — D509 Iron deficiency anemia, unspecified: Secondary | ICD-10-CM

## 2012-02-08 DIAGNOSIS — D689 Coagulation defect, unspecified: Secondary | ICD-10-CM

## 2012-02-08 LAB — IBC PANEL: Iron: 40 ug/dL — ABNORMAL LOW (ref 42–165)

## 2012-02-08 LAB — FERRITIN: Ferritin: 83.5 ng/mL (ref 22.0–322.0)

## 2012-02-08 MED ORDER — MOVIPREP 100 G PO SOLR: 1.0000 | Freq: Once | ORAL | Status: DC

## 2012-02-08 NOTE — Progress Notes (Signed)
HISTORY OF PRESENT ILLNESS:  [name] is a 61 y.o. male with MULTIPLE SIGNIFICANT medical problems including morbid obesity, insulin requiring diabetes,coronary artery disease status post coronary artery stent placement August 2012, myocardial infarction, congestive heart failure, atrial fibrillation status post cardioversion December 2012, hypertension, and rheumatoid arthritis. The patient is sent today through the courtesy of Dr. [name] regarding microcytic anemia. Patient had been on Plavix therapy chronically until recently. He is currently on Coumadin. In terms of anemia, patient's hemoglobin on 11/12/2011 was 10.9 with an MCV of 77.9. Reviewing the old records as long as 3 years ago, he has had a persistent microcytic anemia ranging between hemoglobin of 9.8 and 11.6. MCV is chronically in the 70s. He does report some rectal bleeding over the past few months. Bright red blood. He attributes this to hemorrhoids. He has chronic stable constipation. Continue a weight gain. No heartburn or indigestion. GI review of systems otherwise negative.[name]  REVIEW OF SYSTEMS:  All non-GI ROS negative except for sinus trouble, arthritis, back pain, visual change, shortness of breath, ankle edema, excessive urination  Past Medical History  Diagnosis Date  . Diabetes mellitus   . Hypertension   . Rheumatoid arthritis   . Neuropathy   . Atrial fibrillation   . CHF (congestive heart failure)   . History of MI (myocardial infarction)   . CAD (coronary artery disease)   . 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 Abdominal surgery   . Debridement  foot   . Coronary angioplasty with stent placement     Social History ALLARD LIGHTSEY  reports that he quit smoking about 28 years ago. His smoking use included Cigarettes. He has a 20 pack-year smoking history. He has never used smokeless tobacco. He reports that he does not drink alcohol or use illicit  drugs.  family history includes Other in his other.  No Known Allergies     PHYSICAL EXAMINATION: Vital signs: BP 158/70  Pulse 76  Ht 6' (1.829 m)  Wt 406 lb (184.16 kg)  BMI 55.06 kg/m2  SpO2 97%  Constitutional: morbidly obese and unhealthy-appearing, no acute distress Psychiatric: alert and oriented x3, cooperative Eyes: extraocular movements intact, anicteric, conjunctiva pink Mouth: oral pharynx moist, no lesions Neck: thicken and short, no lymphadenopathy Cardiovascular: heart regular rate and rhythm, no murmur Lungs: clear to auscultation bilaterally Abdomen: soft,morbid obese, nontender, nondistended, no obvious ascites, no peritoneal signs, normal bowel sounds, no organomegaly. Well-healed previous surgical incision. Some diastases to the right of the incision Rectal:deferred until colonoscopy Extremities: no lower extremity edema bilaterally. Bilateral stasis changes Skin: no lesions on visible extremities Neuro: No focal deficits.   ASSESSMENT:  #1. Chronic microcytic anemia. Rule out underlying GI mucosal lesions #2. Intermittent rectal bleeding as described #3. Chronic constipation #4. Multiple medical problems, on Coumadin for history of atrial fibrillation #5. Insulin requiring diabetes #6. Morbid obesity   PLAN:  #1. Anemia panel #2. Colonoscopy and upper endoscopy to evaluate microcytic anemia in a patient on chronic anticoagulation therapy. Patient is at very high risk given his body habitus and comorbidities. As such, the exam will be set up at the hospital with anesthesia assisted and supervised propofol administration for sedation.The nature of the procedure, as well as the risks, benefits, and alternatives were carefully and thoroughly reviewed with the patient. Ample time for discussion and questions allowed. The patient understood, was satisfied, and agreed to proceed.  #3.  Movi prep prescribed. The patient instructed on its use #4. Hold a.m.  Insulin the day of the procedure. Monitor blood sugar medially before and after his procedure #5. Would like to hold Coumadin for 4 days prior to the procedures in case therapy require. We will confer with Dr. [name] regarding this issue. We would anticipate him resuming Coumadin immediately after the procedure, unless significant pathology found

## 2012-02-08 NOTE — Patient Instructions (Addendum)
Your physician has requested that you go to the basement for lab work before leaving today   You have been scheduled for an endoscopy and colonoscopy with propofol at [facility].  Please follow the written instructions given to you at your visit today. Please pick up your prep at the pharmacy within the next 1-3 days. If you use inhalers (even only as needed), please bring them with you on the day of your procedure.

## 2012-02-09 ENCOUNTER — Telehealth: Payer: Self-pay

## 2012-02-09 LAB — FOLATE: Folate: 16.5 ng/mL (ref >5.9)

## 2012-02-09 NOTE — Telephone Encounter (Signed)
Left message

## 2012-02-09 NOTE — Telephone Encounter (Signed)
  02/09/2012    RE: SPIKE DESILETS DOB: [birth year] MRN: [id number]   Dear Dr. [name],    We have scheduled the above patient for an endoscopic procedure. Our records show that he is on anticoagulation therapy.   Please advise as to how long the patient may come off his therapy of Coumadin prior to the procedure, which is scheduled for 03-28-12.  Please fax back/ or route the completed form to [place] at [phone].   Sincerely,  Gracy Racer

## 2012-02-09 NOTE — Telephone Encounter (Signed)
Already contacted by dr Marina Goodell. Coumadin can be held for procedure and resume after [name]

## 2012-02-24 ENCOUNTER — Ambulatory Visit (INDEPENDENT_AMBULATORY_CARE_PROVIDER_SITE_OTHER): Payer: Medicare Other | Admitting: *Deleted

## 2012-02-24 DIAGNOSIS — I4891 Unspecified atrial fibrillation: Secondary | ICD-10-CM

## 2012-02-24 DIAGNOSIS — Z7901 Long term (current) use of anticoagulants: Secondary | ICD-10-CM

## 2012-02-28 ENCOUNTER — Ambulatory Visit
Admission: RE | Admit: 2012-02-28 | Discharge: 2012-02-28 | Disposition: A | Discharge status: Home / Self Care | Payer: Medicare Other | Source: Ambulatory Visit | Attending: Nephrology | Admitting: Nephrology

## 2012-02-28 ENCOUNTER — Other Ambulatory Visit: Payer: Self-pay | Admitting: Nephrology

## 2012-02-28 DIAGNOSIS — R52 Pain, unspecified: Secondary | ICD-10-CM

## 2012-03-15 ENCOUNTER — Telehealth: Payer: Self-pay | Admitting: Internal Medicine

## 2012-03-15 NOTE — Telephone Encounter (Signed)
pt had to r.s his appt because he is out of town, would you like to charge?

## 2012-03-15 NOTE — Telephone Encounter (Signed)
Pt is going to be out of town the week of September 17th and he cannot keep his appt. Appt cancelled at [facility]. Pt has to have his procedure at the hospital due to BMI. Dr. Marina Goodell when would you like to reschedule this pt? Please advise.

## 2012-03-20 NOTE — Telephone Encounter (Signed)
Multiple medical issues (BMI, DM, coumadin...). Reschedule my next hospital week with propofol / MAC. See my office note for other instructions regarding pre-procedure care. Thanks

## 2012-03-23 ENCOUNTER — Ambulatory Visit (INDEPENDENT_AMBULATORY_CARE_PROVIDER_SITE_OTHER): Payer: Medicare Other | Admitting: Pharmacist

## 2012-03-23 DIAGNOSIS — I4891 Unspecified atrial fibrillation: Secondary | ICD-10-CM

## 2012-03-23 DIAGNOSIS — Z7901 Long term (current) use of anticoagulants: Secondary | ICD-10-CM

## 2012-03-28 ENCOUNTER — Encounter (HOSPITAL_COMMUNITY): Payer: Self-pay

## 2012-03-28 ENCOUNTER — Ambulatory Visit (HOSPITAL_COMMUNITY): Admit: 2012-03-28 | Payer: Self-pay | Admitting: Internal Medicine

## 2012-03-28 SURGERY — ESOPHAGOGASTRODUODENOSCOPY (EGD) WITH PROPOFOL
Anesthesia: Monitor Anesthesia Care

## 2012-04-12 NOTE — Telephone Encounter (Signed)
Left message for pt to call back. Dr. Marina Goodell would like to schedule the procedure for the week of November 11th.

## 2012-04-13 NOTE — Telephone Encounter (Signed)
Spoke with pt and he states he will call back when he has his schedule.

## 2012-04-25 ENCOUNTER — Other Ambulatory Visit: Payer: Self-pay | Admitting: Nephrology

## 2012-04-25 DIAGNOSIS — M545 Low back pain: Secondary | ICD-10-CM

## 2012-04-27 ENCOUNTER — Telehealth: Payer: Self-pay | Admitting: Internal Medicine

## 2012-04-27 ENCOUNTER — Other Ambulatory Visit: Payer: Self-pay | Admitting: Internal Medicine

## 2012-04-27 DIAGNOSIS — Z1211 Encounter for screening for malignant neoplasm of colon: Secondary | ICD-10-CM

## 2012-04-27 NOTE — Telephone Encounter (Signed)
Pt scheduled his EGD/Colon with propofol at [facility] 05/22/12. Pt to arrive at 8:45am for a 9:45am appt. Pt mailed the instructions. Scheduled with Noreene Larsson. Booking number [id number].

## 2012-04-28 ENCOUNTER — Ambulatory Visit (INDEPENDENT_AMBULATORY_CARE_PROVIDER_SITE_OTHER): Payer: Medicare Other

## 2012-04-28 DIAGNOSIS — I4891 Unspecified atrial fibrillation: Secondary | ICD-10-CM

## 2012-04-28 DIAGNOSIS — Z7901 Long term (current) use of anticoagulants: Secondary | ICD-10-CM

## 2012-04-29 ENCOUNTER — Other Ambulatory Visit: Payer: Medicare Other

## 2012-05-08 ENCOUNTER — Ambulatory Visit: Payer: Medicare Other | Admitting: Cardiology

## 2012-05-11 ENCOUNTER — Encounter (HOSPITAL_COMMUNITY): Payer: Self-pay | Admitting: *Deleted

## 2012-05-11 NOTE — Progress Notes (Signed)
PT STATES HAVING CARDIAC CATH NOT SURE WHICH [facility] [place], STENT AT [place] NOT [facility], SLEEP STUDY SHOWED SEVERE  SLEEP APNEA PT NOT SURE WHERE SLEEP STUDY DONE.  PT STATED HAVING WOUND AT TOP OF SPINE NEAR ANUS DRAINING BLOOD, WILL FOLLOW ABOUT WOUND AT URGENT CARE.  EKG  11-12-2011 EPIC CHEST 2VIEW XRAY 01-18-2012 EPIC ECHO 08-17-2011 EPIC

## 2012-05-12 ENCOUNTER — Encounter (HOSPITAL_COMMUNITY): Payer: Self-pay | Admitting: Pharmacy Technician

## 2012-05-19 ENCOUNTER — Telehealth: Payer: Self-pay | Admitting: Internal Medicine

## 2012-05-19 NOTE — Telephone Encounter (Signed)
FYI.  I have cancelled the appt with Dan at [facility] endo.

## 2012-05-22 ENCOUNTER — Encounter (HOSPITAL_COMMUNITY): Admission: RE | Payer: Self-pay | Source: Ambulatory Visit

## 2012-05-22 ENCOUNTER — Ambulatory Visit (HOSPITAL_COMMUNITY): Admission: RE | Admit: 2012-05-22 | Payer: Medicare Other | Source: Ambulatory Visit | Admitting: Internal Medicine

## 2012-05-22 SURGERY — EGD (ESOPHAGOGASTRODUODENOSCOPY)
Anesthesia: Monitor Anesthesia Care

## 2012-05-29 ENCOUNTER — Other Ambulatory Visit: Payer: Medicare Other

## 2012-05-29 ENCOUNTER — Ambulatory Visit (INDEPENDENT_AMBULATORY_CARE_PROVIDER_SITE_OTHER): Payer: Medicare Other

## 2012-05-29 DIAGNOSIS — I4891 Unspecified atrial fibrillation: Secondary | ICD-10-CM

## 2012-05-29 DIAGNOSIS — Z7901 Long term (current) use of anticoagulants: Secondary | ICD-10-CM

## 2012-06-05 ENCOUNTER — Other Ambulatory Visit: Payer: Medicare Other

## 2012-06-10 ENCOUNTER — Other Ambulatory Visit: Payer: Self-pay | Admitting: Cardiology

## 2012-06-11 ENCOUNTER — Other Ambulatory Visit: Payer: Medicare Other

## 2012-06-12 ENCOUNTER — Other Ambulatory Visit: Payer: Self-pay | Admitting: *Deleted

## 2012-06-21 ENCOUNTER — Encounter (HOSPITAL_BASED_OUTPATIENT_CLINIC_OR_DEPARTMENT_OTHER): Payer: Medicare Other | Attending: General Surgery

## 2012-06-21 DIAGNOSIS — I4891 Unspecified atrial fibrillation: Secondary | ICD-10-CM | POA: Insufficient documentation

## 2012-06-21 DIAGNOSIS — Z79899 Other long term (current) drug therapy: Secondary | ICD-10-CM | POA: Insufficient documentation

## 2012-06-21 DIAGNOSIS — L02219 Cutaneous abscess of trunk, unspecified: Secondary | ICD-10-CM | POA: Insufficient documentation

## 2012-06-21 DIAGNOSIS — Z794 Long term (current) use of insulin: Secondary | ICD-10-CM | POA: Insufficient documentation

## 2012-06-21 DIAGNOSIS — E119 Type 2 diabetes mellitus without complications: Secondary | ICD-10-CM | POA: Insufficient documentation

## 2012-06-21 DIAGNOSIS — I10 Essential (primary) hypertension: Secondary | ICD-10-CM | POA: Insufficient documentation

## 2012-06-26 NOTE — Progress Notes (Cosign Needed)
[facility]  NAME:  [name], [name]               ACCOUNT NO.:  [id number]  MEDICAL RECORD NO.:  [id number]      DATE OF BIRTH:  [birth year]  PHYSICIAN:  Wayland Denis, DO       VISIT DATE:  06/26/2012                                  OFFICE VISIT   CHIEF COMPLAINT:  Sacral abscess.  HISTORY OF PRESENT ILLNESS:  The patient is a 61 year old gentleman who is here for evaluation of sacral abscesses.  He has had them in the past and was here about a year ago, they are actually doing better.  There is no open areas.  He has three areas of scab and slight tenderness, but no overt infection.  He has been sitting a lot on his sacral area due to being out of work and on disability.  He says he washes his cloths daily and changes his underwear on a daily basis.  He has been putting triple antibiotic ointment on the area with some improvement.  PAST MEDICAL HISTORY:  Positive for diabetes, hypertension, AFib, obesity, peripheral neuropathy, and mild degenerative joint disease.  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  MEDICATIONS:  Include furosemide, metoprolol, lisinopril, spironolactone, Crestor, gabapentin, Klor-Con, warfarin, vitamin D2, NovoLog, Lantus and multivitamin.  ALLERGIES:  No known drug allergies.  SOCIAL HISTORY:  He is currently on disability and lives at home.  REVIEW OF SYSTEMS:  Otherwise negative.  PHYSICAL EXAMINATION:  He is alert, oriented, and cooperative, not in any acute distress.  He is pleasant.  His pupils are equal.  Extraocular muscles are intact.  No cervical lymphadenopathy.  He is obese, but no abdominal tenderness.  The sacral area has three scabbed areas as mentioned above.  No overt infection.  He has got a lot of periwound and sacral folliculitis-looking skin, this may be a sensitivity to his detergent or soap.  I recommend that he get  a detergent for sensitive skin.  I definitely want to see him back.  If any of these areas become or look infected, then we will start him on antibiotic, and also recommend checking a protein level.  He should take multivitamin, vitamin C and zinc on a daily basis, offload, sitz baths, and increase his protein level.     Wayland Denis, DO     CS/MEDQ  D:  06/26/2012  T:  06/26/2012  Job:  [id number]

## 2012-07-12 HISTORY — PX: FOREIGN BODY REMOVAL: SHX962

## 2012-07-19 ENCOUNTER — Encounter (HOSPITAL_BASED_OUTPATIENT_CLINIC_OR_DEPARTMENT_OTHER): Payer: Medicare Other

## 2012-07-27 ENCOUNTER — Ambulatory Visit (INDEPENDENT_AMBULATORY_CARE_PROVIDER_SITE_OTHER): Payer: Medicare Other

## 2012-07-27 DIAGNOSIS — Z7901 Long term (current) use of anticoagulants: Secondary | ICD-10-CM

## 2012-07-27 DIAGNOSIS — I4891 Unspecified atrial fibrillation: Secondary | ICD-10-CM

## 2012-08-16 ENCOUNTER — Encounter: Payer: Medicare Other | Admitting: Cardiology

## 2012-08-16 NOTE — Progress Notes (Signed)
HPI: Pleasant gentleman that I have seen for atrial fibrillation and CAD. Transesophageal echocardiogram in November of 2010 revealed normal LV function, moderate left atrial enlargement, mild right atrial enlargement, possible left atrial appendage thrombus and mild mitral regurgitation. Note a TSH was low but free T3 and free T4 were normal. He has been followed by endocrinology for this. We scheduled a cardioversion which was performed on June 18, 2009. The procedure was successful. Patient had PCI of his right coronary artery in August of 2012 in [place]. He had a drug-eluting stent. Last echocardiogram in February of 2013 showed normal LV function. The left atrium was mild to moderately dilated. ABIs in April of 2013 were unremarkable. Since I last saw him in May 2013, he does have some dyspnea on exertion but no orthopnea, PND, chest pain or syncope. Mild pedal edema.   Current Outpatient Prescriptions  Medication Sig Dispense Refill  . Ergocalciferol (VITAMIN D2 PO) Take 50,000 Units by mouth every 7 (seven) days.      . furosemide (LASIX) 40 MG tablet TAKE 1 TABLET BY MOUTH TWICE A DAY  60 tablet  5  . gabapentin (NEURONTIN) 300 MG capsule Take 300 mg by mouth daily.       . insulin aspart (NOVOLOG) 100 UNIT/ML injection Inject 100 Units into the skin 3 (three) times daily before meals.       . insulin glargine (LANTUS) 100 UNIT/ML injection Inject 70 Units into the skin at bedtime.       [name] lisinopril-hydrochlorothiazide (PRINZIDE,ZESTORETIC) 20-12.5 MG per tablet Take 1 tablet by mouth 2 (two) times daily.      . metoprolol succinate (TOPROL-XL) 25 MG 24 hr tablet Take 25 mg by mouth every evening.      [name] MOVIPREP 100 G SOLR Take 1 kit (100 g total) by mouth once.  1 kit  0  . Multiple Vitamin (MULTIVITAMIN WITH MINERALS) TABS Take 1 tablet by mouth daily.      [name] oxycodone (ROXICODONE) 30 MG immediate release tablet Take 30 mg by mouth every 4 (four) hours as needed. Pain      .  potassium chloride SA (K-DUR,KLOR-CON) 20 MEQ tablet Take 20 mEq by mouth 2 (two) times daily.       . rosuvastatin (CRESTOR) 10 MG tablet Take 10 mg by mouth every evening.      [name] spironolactone (ALDACTONE) 25 MG tablet Take 25 mg by mouth every evening.       . warfarin (COUMADIN) 5 MG tablet TAKE AS DIRECTED  45 tablet  3     Past Medical History  Diagnosis Date  . Diabetes mellitus   . Hypertension   . Rheumatoid arthritis   . Neuropathy   . CHF (congestive heart failure)   . History of MI (myocardial infarction)     SILENT MI IN PAST   . CAD (coronary artery disease)   . Atrial fibrillation   . Anemia   . Sleep apnea      SEVERE, NO CPAP USED, DOES NOT KNOW  WHERE SLEEP STUDY DONE    Past Surgical History  Procedure Date  . Abdominal surgery AGE 62 MONTHS    BENIGN TUMOR REMOVED  . Debridement  foot FEW YRS AGO    LEFT FOOT, WOUNDS ALL HEALED NOW  . Coronary angioplasty with stent placement FEB 2013     DONE AT [facility] [place]    History   Social History  . Marital Status: Single  Spouse Name: N/A    Number of Children: Y  . Years of Education: N/A   Occupational History  . disabled truck driver    Social History Main Topics  . Smoking status: Former Smoker -- 1.0 packs/day for 20 years    Types: Cigarettes    Quit date: 07/13/1983  . Smokeless tobacco: Never Used  . Alcohol Use: No  . Drug Use: No  . Sexually Active: Not on file   Other Topics Concern  . Not on file   Social History Narrative   Pt is single.Lives with sister.Has children.Was adopted.Daily cafffiene-2 cups of coffee and 2 sodas per day    ROS: no fevers or chills, productive cough, hemoptysis, dysphasia, odynophagia, melena, hematochezia, dysuria, hematuria, rash, seizure activity, orthopnea, PND, pedal edema, claudication. Remaining systems are negative.  Physical Exam: Well-developed well-nourished in no acute distress.  Skin is warm and dry.  HEENT is normal.  Neck is  supple.  Chest is clear to auscultation with normal expansion.  Cardiovascular exam is regular rate and rhythm.  Abdominal exam nontender or distended. No masses palpated. Extremities show no edema. neuro grossly intact  ECG     This encounter was created in error - please disregard.

## 2012-09-06 ENCOUNTER — Inpatient Hospital Stay (HOSPITAL_COMMUNITY)
Admission: EM | Admit: 2012-09-06 | Discharge: 2012-09-10 | DRG: 394 | Disposition: A | Discharge status: Home / Self Care | Payer: PRIVATE HEALTH INSURANCE | Attending: Internal Medicine | Admitting: Internal Medicine

## 2012-09-06 ENCOUNTER — Emergency Department (HOSPITAL_COMMUNITY): Payer: PRIVATE HEALTH INSURANCE

## 2012-09-06 ENCOUNTER — Encounter (HOSPITAL_COMMUNITY): Payer: Self-pay | Admitting: Emergency Medicine

## 2012-09-06 DIAGNOSIS — D72829 Elevated white blood cell count, unspecified: Secondary | ICD-10-CM | POA: Diagnosis present

## 2012-09-06 DIAGNOSIS — Z79899 Other long term (current) drug therapy: Secondary | ICD-10-CM

## 2012-09-06 DIAGNOSIS — I4891 Unspecified atrial fibrillation: Secondary | ICD-10-CM | POA: Diagnosis present

## 2012-09-06 DIAGNOSIS — Z6841 Body Mass Index (BMI) 40.0 and over, adult: Secondary | ICD-10-CM

## 2012-09-06 DIAGNOSIS — N183 Chronic kidney disease, stage 3 unspecified: Secondary | ICD-10-CM | POA: Diagnosis present

## 2012-09-06 DIAGNOSIS — M069 Rheumatoid arthritis, unspecified: Secondary | ICD-10-CM | POA: Diagnosis present

## 2012-09-06 DIAGNOSIS — Z87891 Personal history of nicotine dependence: Secondary | ICD-10-CM

## 2012-09-06 DIAGNOSIS — Z794 Long term (current) use of insulin: Secondary | ICD-10-CM

## 2012-09-06 DIAGNOSIS — E1142 Type 2 diabetes mellitus with diabetic polyneuropathy: Secondary | ICD-10-CM | POA: Diagnosis present

## 2012-09-06 DIAGNOSIS — I129 Hypertensive chronic kidney disease with stage 1 through stage 4 chronic kidney disease, or unspecified chronic kidney disease: Secondary | ICD-10-CM | POA: Diagnosis present

## 2012-09-06 DIAGNOSIS — I2510 Atherosclerotic heart disease of native coronary artery without angina pectoris: Secondary | ICD-10-CM | POA: Diagnosis present

## 2012-09-06 DIAGNOSIS — I5042 Chronic combined systolic (congestive) and diastolic (congestive) heart failure: Secondary | ICD-10-CM | POA: Diagnosis present

## 2012-09-06 DIAGNOSIS — Z7901 Long term (current) use of anticoagulants: Secondary | ICD-10-CM

## 2012-09-06 DIAGNOSIS — T4275XA Adverse effect of unspecified antiepileptic and sedative-hypnotic drugs, initial encounter: Secondary | ICD-10-CM | POA: Diagnosis present

## 2012-09-06 DIAGNOSIS — D649 Anemia, unspecified: Secondary | ICD-10-CM | POA: Diagnosis present

## 2012-09-06 DIAGNOSIS — E785 Hyperlipidemia, unspecified: Secondary | ICD-10-CM | POA: Diagnosis present

## 2012-09-06 DIAGNOSIS — E1149 Type 2 diabetes mellitus with other diabetic neurological complication: Secondary | ICD-10-CM | POA: Diagnosis present

## 2012-09-06 DIAGNOSIS — I509 Heart failure, unspecified: Secondary | ICD-10-CM | POA: Diagnosis present

## 2012-09-06 DIAGNOSIS — I252 Old myocardial infarction: Secondary | ICD-10-CM

## 2012-09-06 DIAGNOSIS — K612 Anorectal abscess: Principal | ICD-10-CM | POA: Diagnosis present

## 2012-09-06 DIAGNOSIS — L0591 Pilonidal cyst without abscess: Secondary | ICD-10-CM | POA: Diagnosis present

## 2012-09-06 DIAGNOSIS — G4730 Sleep apnea, unspecified: Secondary | ICD-10-CM | POA: Diagnosis present

## 2012-09-06 DIAGNOSIS — K5909 Other constipation: Secondary | ICD-10-CM | POA: Diagnosis present

## 2012-09-06 DIAGNOSIS — K611 Rectal abscess: Secondary | ICD-10-CM | POA: Diagnosis present

## 2012-09-06 LAB — BASIC METABOLIC PANEL
BUN: 24 mg/dL — ABNORMAL HIGH (ref 6–23)
Calcium: 9 mg/dL (ref 8.4–10.5)
Chloride: 94 mEq/L — ABNORMAL LOW (ref 96–112)
Creatinine, Ser: 1.5 mg/dL — ABNORMAL HIGH (ref 0.50–1.35)
GFR calc Af Amer: 57 mL/min — ABNORMAL LOW (ref >90)

## 2012-09-06 LAB — CBC WITH DIFFERENTIAL/PLATELET
Basophils Absolute: 0.1 10*3/uL (ref 0.0–0.1)
Basophils Relative: 0 % (ref 0–1)
Eosinophils Relative: 3 % (ref 0–5)
HCT: 35.9 % — ABNORMAL LOW (ref 39.0–52.0)
Hemoglobin: 12 g/dL — ABNORMAL LOW (ref 13.0–17.0)
MCH: 26.9 pg (ref 26.0–34.0)
MCHC: 33.4 g/dL (ref 30.0–36.0)
MCV: 80.5 fL (ref 78.0–100.0)
Monocytes Absolute: 1.1 10*3/uL — ABNORMAL HIGH (ref 0.1–1.0)
Monocytes Relative: 8 % (ref 3–12)
Neutro Abs: 9.4 10*3/uL — ABNORMAL HIGH (ref 1.7–7.7)
RDW: 13.8 % (ref 11.5–15.5)

## 2012-09-06 LAB — PROTIME-INR: Prothrombin Time: 17.8 seconds — ABNORMAL HIGH (ref 11.6–15.2)

## 2012-09-06 LAB — GLUCOSE, CAPILLARY: Glucose-Capillary: 304 mg/dL — ABNORMAL HIGH (ref 70–99)

## 2012-09-06 MED ORDER — SODIUM CHLORIDE 0.9 % IV SOLN
Freq: Once | INTRAVENOUS | Status: AC
  Administered 2012-09-06: 1000 mL via INTRAVENOUS

## 2012-09-06 MED ORDER — HYDROMORPHONE HCL PF 1 MG/ML IJ SOLN
1.0000 mg | Freq: Once | INTRAMUSCULAR | Status: AC
  Administered 2012-09-06: 1 mg via INTRAVENOUS
  Filled 2012-09-06: qty 1

## 2012-09-06 MED ORDER — VANCOMYCIN HCL IN DEXTROSE 1-5 GM/200ML-% IV SOLN
1000.0000 mg | Freq: Once | INTRAVENOUS | Status: AC
  Administered 2012-09-07: 1000 mg via INTRAVENOUS
  Filled 2012-09-06: qty 200

## 2012-09-06 MED ORDER — SODIUM CHLORIDE 0.9 % IV SOLN
INTRAVENOUS | Status: DC
  Administered 2012-09-07: via INTRAVENOUS

## 2012-09-06 MED ORDER — INSULIN REGULAR HUMAN 100 UNIT/ML IJ SOLN: 10.0000 [IU] | Freq: Once | INTRAMUSCULAR | Status: DC

## 2012-09-06 MED ORDER — SODIUM CHLORIDE 0.9 % IV BOLUS (SEPSIS)
1000.0000 mL | Freq: Once | INTRAVENOUS | Status: AC
  Administered 2012-09-06: 1000 mL via INTRAVENOUS

## 2012-09-06 MED ORDER — INSULIN ASPART 100 UNIT/ML ~~LOC~~ SOLN
10.0000 [IU] | Freq: Once | SUBCUTANEOUS | Status: AC
Start: 2012-09-06 — End: 2012-09-06
  Administered 2012-09-06: 10 [IU] via SUBCUTANEOUS
  Filled 2012-09-06: qty 1

## 2012-09-06 MED ORDER — INSULIN GLARGINE 100 UNIT/ML ~~LOC~~ SOLN
70.0000 [IU] | Freq: Once | SUBCUTANEOUS | Status: AC
  Administered 2012-09-07: 70 [IU] via SUBCUTANEOUS
  Filled 2012-09-06: qty 1

## 2012-09-06 MED ORDER — IOHEXOL 300 MG/ML  SOLN
100.0000 mL | Freq: Once | INTRAMUSCULAR | Status: AC | PRN
  Administered 2012-09-06: 100 mL via INTRAVENOUS

## 2012-09-06 NOTE — H&P (Signed)
[name] History and Physical  [name] QIO:[id number] DOB: February 22, 1951 DOA: 09/06/2012  Referring physician: ED physician. PCP: Jeri Cos, MD  Specialists: None.  Chief Complaint: Pain around the rectal area.  HPI: [name] is a 62 y.o. male history of diabetes mellitus type 2, atrial fibrillation Coumadin, CAD was referred to the ER after patient had persistent rectal pain or discharge. Patient has been having some perirectal pain 6 weeks ago. This has been gradually worsening with some discharge. Initially it started off a small lump in the left completely and eventually started involving the right and the perineal area. Patient started noticing discharge. Patient was referred to the wound center and eventually referred to the ER for further management. Patient denies any fever chills or any scrotal pain. Patient denies nausea vomiting or diarrhea. In the ER CT pelvis shows perirectal cellulitis and has been admitted for further management. In addition patient was found to be mildly tachycardic. Patient otherwise denies any chest pain or shortness of breath.  Review of Systems: As presented in history of presenting illness nothing else significant.   Past Medical History  Diagnosis Date  . Diabetes mellitus   . Hypertension   . Rheumatoid arthritis   . Neuropathy   . CHF (congestive heart failure)   . History of MI (myocardial infarction)     SILENT MI IN PAST   . CAD (coronary artery disease)   . Atrial fibrillation   . Anemia   . Sleep apnea      SEVERE, NO CPAP USED, DOES NOT KNOW  WHERE SLEEP STUDY DONE   Past Surgical History  Procedure Laterality Date  . Abdominal surgery  AGE 31 MONTHS    BENIGN TUMOR REMOVED  . Debridement  foot  FEW YRS AGO    LEFT FOOT, WOUNDS ALL HEALED NOW  . Coronary angioplasty with stent placement  FEB 2013     DONE AT [facility]   Social History:  reports that he quit smoking about 29 years ago. His smoking  use included Cigarettes. He has a 20 pack-year smoking history. He has never used smokeless tobacco. He reports that he does not drink alcohol or use illicit drugs. Lives at home. where does patient live--home, ALF, SNF? and with whom if at home? Can do ADLs. Can patient participate in ADLs?  No Known Allergies  Family History  Problem Relation Age of Onset  . Other Other     PT ADOPTED     Prior to Admission medications   Medication Sig Start Date End Date Taking? Authorizing Provider  furosemide (LASIX) 40 MG tablet TAKE 1 TABLET BY MOUTH TWICE A DAY 01/13/12  Yes Lewayne Bunting, MD  gabapentin (NEURONTIN) 300 MG capsule Take 300 mg by mouth 2 (two) times daily.  04/20/12  Yes Historical Provider, MD  insulin aspart (NOVOLOG) 100 UNIT/ML injection Inject 40-300 Units into the skin 3 (three) times daily before meals. Sliding scale.   Yes Historical Provider, MD  insulin glargine (LANTUS) 100 UNIT/ML injection Inject 70 Units into the skin at bedtime.    Yes Historical Provider, MD  lisinopril-hydrochlorothiazide (PRINZIDE,ZESTORETIC) 20-12.5 MG per tablet Take 1 tablet by mouth daily.    Yes Historical Provider, MD  metaxalone (SKELAXIN) 800 MG tablet Take 800 mg by mouth daily.   Yes Historical Provider, MD  Multiple Vitamin (MULTIVITAMIN WITH MINERALS) TABS Take 1 tablet by mouth daily.   Yes Historical Provider, MD  oxycodone (ROXICODONE) 30 MG immediate  release tablet Take 30 mg by mouth every 4 (four) hours as needed. Pain   Yes Historical Provider, MD  oxyCODONE-acetaminophen (PERCOCET/ROXICET) 5-325 MG per tablet Take 1-2 tablets by mouth every 6 (six) hours as needed for pain.   Yes Historical Provider, MD  potassium chloride SA (K-DUR,KLOR-CON) 20 MEQ tablet Take 20 mEq by mouth 2 (two) times daily.   Yes Historical Provider, MD  rosuvastatin (CRESTOR) 10 MG tablet Take 10 mg by mouth every evening. 06/18/11  Yes Lewayne Bunting, MD  spironolactone (ALDACTONE) 25 MG tablet Take 25  mg by mouth every evening.    Yes Historical Provider, MD  warfarin (COUMADIN) 5 MG tablet Take 5-7.5 mg by mouth daily. Take 7.5 mg once each day on Thursday and Saturday; all other days take 5 mg once daily.   Yes Historical Provider, MD  potassium chloride SA (K-DUR,KLOR-CON) 20 MEQ tablet Take 20 mEq by mouth 2 (two) times daily.  08/04/11 08/03/12  Lewayne Bunting, MD   Physical Exam: Filed Vitals:   09/06/12 1758 09/06/12 1907  BP: 130/75 132/73  Pulse: 109 98  Temp: 98.1 F (36.7 C) 98.3 F (36.8 C)  TempSrc: Oral   Resp: 23 20  SpO2: 100% 97%     General:  Well-developed well-nourished.  Eyes: Anicteric no pallor.  ENT: No discharge from ears eyes nose or mouth.  Neck: No mass felt.  Cardiovascular: S1-S2 heard tachycardic.  Respiratory: No rhonchi or crepitations.  Abdomen: Soft nontender.  Skin: Skin around the perirectal area is erythematous and tender. There is no scrotal skin changes.  Musculoskeletal: Nontender.  Psychiatric: Appears normal.  Neurologic: Moves all extremities.  Labs on Admission:  Basic Metabolic Panel:  Recent Labs Lab 09/06/12 2000  NA 132*  K 4.4  CL 94*  CO2 24  GLUCOSE 409*  BUN 24*  CREATININE 1.50*  CALCIUM 9.0   Liver Function Tests: No results found for this basename: AST, ALT, ALKPHOS, BILITOT, PROT, ALBUMIN,  in the last 168 hours No results found for this basename: LIPASE, AMYLASE,  in the last 168 hours No results found for this basename: AMMONIA,  in the last 168 hours CBC:  Recent Labs Lab 09/06/12 2000  WBC 14.0*  NEUTROABS 9.4*  HGB 12.0*  HCT 35.9*  MCV 80.5  PLT 268   Cardiac Enzymes: No results found for this basename: CKTOTAL, CKMB, CKMBINDEX, TROPONINI,  in the last 168 hours  BNP (last 3 results) No results found for this basename: PROBNP,  in the last 8760 hours CBG:  Recent Labs Lab 09/06/12 2210  GLUCAP 304*    Radiological Exams on Admission: Ct Pelvis W Contrast  09/06/2012   *RADIOLOGY REPORT*  Clinical Data:  The patient had a perianal abscess 3 months ago that was treated.  There was initial improvement but now has worsened.  CT PELVIS WITH CONTRAST  Technique:  Multidetector CT imaging of the pelvis was performed using the standard protocol following the bolus administration of intravenous contrast.  Contrast: OMNIPAQUE IOHEXOL 300 MG/ML  SOLN  Comparison:   None.  Findings:  CT examination of the pelvis is obtained with IV and rectal contrast material. Technically limited study due to the patient's body habitus. The There is infiltration in the perineal/perianal soft tissues to the left of midline extending from the region of the anus inferiorly.  This measures maximally about 16 x 66 mm.  There is associated skin thickening.  This could represent cellulitis or fistula.  No  discrete tract is identified. There is no evidence of fluid collection to suggest abscess.  No soft tissue gas collections.  No contrast extravasation is noted.  Pelvic organs appear intact.  Bladder wall is not thickened. Prostate gland is not enlarged.  No free or loculated pelvic fluid collections.  Diverticula in the sigmoid colon without diverticulitis.  The appendix is not identified.  IMPRESSION: Soft tissue infiltration in the left peri anal and inferior perineal fat with skin thickening suggesting cellulitis.  No fluid collection to suggest abscess.   Original Report Authenticated By: Burman Nieves, M.D.      Assessment/Plan Principal Problem:   Perirectal cellulitis Active Problems:   DIABETES MELLITUS-TYPE II   Atrial fibrillation   CAD (coronary artery disease)   1. Perirectal cellulitis - patient has been started on vancomycin and I have added Zosyn. Blood cultures have been obtained. I have discussed with on-call surgeon who will be seeing in consult. 2. Uncontrolled diabetes mellitus2 - patient states he has not missed his medication. I have ordered his regular home medication  including Lantus with assistance sliding-scale coverage. Closely follow CBG. Patient's blood sugar may be uncontrolled secondary to #1. 3. Atrial fibrillation - presently in sinus tachycardia. Closely follow in telemetry. Patient is on Coumadin but subtherapeutic INR. In anticipation of possible procedures I'm holding off Coumadin and placing patient on IV heparin infusion. 4. CHF - patient presently is tachycardic probably from #1. At this time I have held diuretics and patient did receive 2 L normal saline. For now I will hold off further fluids given patient's history of CHF and closely monitor intake output and respiratory status. 5. CAD - denies any chest pain.  Surgery was consulted.   Code Status:  Full code.  Family Communication:  None at the bedside.  Disposition Plan:  Admit to inpatient.  KAKRAKANDY,ARSHAD N. [name] Pager [phone].  If 7PM-7AM, please contact night-coverage [email or web address] Password [facility] 09/06/2012, 11:54 PM

## 2012-09-06 NOTE — ED Notes (Signed)
No noticeable signs of abscess. States that it is more painful in the perirectal area. Hx of perirectal abscess in 1981

## 2012-09-06 NOTE — ED Provider Notes (Signed)
Pt hand off to me by Elpidio Anis, PA-C.  Pt is a diabetic with chronic sacral ulcer which is treated by the wound care center. Today he is tachycardic, hyperglycemic ( + 400), is not febrile. He Is pending CT scan of the pelvic w contrast to evaluate for abscess.  His sugar is being controlled in Iv fluids and 10 units insulin through IV.  His pain is being controlled with dilaudid IV.  Results for orders placed during the hospital encounter of 09/06/12  CBC WITH DIFFERENTIAL      Result Value Range   WBC 14.0 (*) 4.0 - 10.5 K/uL   RBC 4.46  4.22 - 5.81 MIL/uL   Hemoglobin 12.0 (*) 13.0 - 17.0 g/dL   HCT 16.1 (*) 09.6 - 04.5 %   MCV 80.5  78.0 - 100.0 fL   MCH 26.9  26.0 - 34.0 pg   MCHC 33.4  30.0 - 36.0 g/dL   RDW 40.9  81.1 - 91.4 %   Platelets 268  150 - 400 K/uL   Neutrophils Relative 67  43 - 77 %   Neutro Abs 9.4 (*) 1.7 - 7.7 K/uL   Lymphocytes Relative 22  12 - 46 %   Lymphs Abs 3.0  0.7 - 4.0 K/uL   Monocytes Relative 8  3 - 12 %   Monocytes Absolute 1.1 (*) 0.1 - 1.0 K/uL   Eosinophils Relative 3  0 - 5 %   Eosinophils Absolute 0.4  0.0 - 0.7 K/uL   Basophils Relative 0  0 - 1 %   Basophils Absolute 0.1  0.0 - 0.1 K/uL  BASIC METABOLIC PANEL      Result Value Range   Sodium 132 (*) 135 - 145 mEq/L   Potassium 4.4  3.5 - 5.1 mEq/L   Chloride 94 (*) 96 - 112 mEq/L   CO2 24  19 - 32 mEq/L   Glucose, Bld 409 (*) 70 - 99 mg/dL   BUN 24 (*) 6 - 23 mg/dL   Creatinine, Ser 7.82 (*) 0.50 - 1.35 mg/dL   Calcium 9.0  8.4 - 95.6 mg/dL   GFR calc non Af Amer 49 (*) >90 mL/min   GFR calc Af Amer 57 (*) >90 mL/min  PROTIME-INR      Result Value Range   Prothrombin Time 17.8 (*) 11.6 - 15.2 seconds   INR 1.51 (*) 0.00 - 1.49  APTT      Result Value Range   aPTT 44 (*) 24 - 37 seconds  GLUCOSE, CAPILLARY      Result Value Range   Glucose-Capillary 304 (*) 70 - 99 mg/dL   Ct Pelvis W Contrast  09/06/2012  *RADIOLOGY REPORT*  Clinical Data:  The patient had a perianal  abscess 3 months ago that was treated.  There was initial improvement but now has worsened.  CT PELVIS WITH CONTRAST  Technique:  Multidetector CT imaging of the pelvis was performed using the standard protocol following the bolus administration of intravenous contrast.  Contrast: OMNIPAQUE IOHEXOL 300 MG/ML  SOLN  Comparison:   None.  Findings:  CT examination of the pelvis is obtained with IV and rectal contrast material. Technically limited study due to the patient's body habitus. The There is infiltration in the perineal/perianal soft tissues to the left of midline extending from the region of the anus inferiorly.  This measures maximally about 16 x 66 mm.  There is associated skin thickening.  This could represent cellulitis or fistula.  No discrete tract is identified. There is no evidence of fluid collection to suggest abscess.  No soft tissue gas collections.  No contrast extravasation is noted.  Pelvic organs appear intact.  Bladder wall is not thickened. Prostate gland is not enlarged.  No free or loculated pelvic fluid collections.  Diverticula in the sigmoid colon without diverticulitis.  The appendix is not identified.  IMPRESSION: Soft tissue infiltration in the left peri anal and inferior perineal fat with skin thickening suggesting cellulitis.  No fluid collection to suggest abscess.   Original Report Authenticated By: Burman Nieves, M.D.       Discussed case with Dr. Ranae Palms- we feel the patient needs to be admitted to multiple rounds of IV antibiotics, heart monitoring and sugar monitoring.  [facility], [facility], inpatient, Tele  Dorthula Matas, [place] 09/06/12 2327

## 2012-09-06 NOTE — ED Provider Notes (Signed)
Medical screening examination/treatment/procedure(s) were performed by non-physician practitioner and as supervising physician I was immediately available for consultation/collaboration.   Loren Racer, MD 09/06/12 2322

## 2012-09-06 NOTE — ED Notes (Signed)
Pt states that he has had a perianal abscess x 3 months that he went to the wound center for.  States that it was almost all the way gone but it has since gotten worse.

## 2012-09-06 NOTE — ED Provider Notes (Signed)
History     CSN: [id number]  Arrival date & time 09/06/12  1745   First MD Initiated Contact with Patient 09/06/12 1857      Chief Complaint  Patient presents with  . Abscess    (Consider location/radiation/quality/duration/timing/severity/associated sxs/prior treatment) Patient is a 62 y.o. male presenting with abscess. The history is provided by the patient.  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 He states that the discomfort is moving toward the perirectal area and he became concerned about recurrence of previous perirectal abscess requiring surgical intervention. No fever. No N, V.    Past Medical History  Diagnosis Date  . Diabetes mellitus   . Hypertension   . Rheumatoid arthritis   . Neuropathy   . CHF (congestive heart failure)   . History of MI (myocardial infarction)     SILENT MI IN PAST   . CAD (coronary artery disease)   . Atrial fibrillation   . Anemia   . Sleep apnea      SEVERE, NO CPAP USED, DOES NOT KNOW  WHERE SLEEP STUDY DONE    Past Surgical History  Procedure Laterality Date  . Abdominal surgery  AGE 16 MONTHS    BENIGN TUMOR REMOVED  . Debridement  foot  FEW YRS AGO    LEFT FOOT, WOUNDS ALL HEALED NOW  . Coronary angioplasty with stent placement  FEB 2013     DONE AT [facility]    Family History  Problem Relation Age of Onset  . Other Other     PT ADOPTED    History  Substance Use Topics  . Smoking status: Former Smoker -- 1.00 packs/day for 20 years    Types: Cigarettes    Quit date: 07/13/1983  . Smokeless tobacco: Never Used  . Alcohol Use: No      Review of Systems  Constitutional: Negative for fever.  Gastrointestinal: Negative for nausea  and abdominal pain.       See HPI.  Genitourinary: Negative for dysuria.  Musculoskeletal: Negative for myalgias.  Skin: Positive for wound.  Psychiatric/Behavioral: Negative for confusion.    Allergies  Review of patient's allergies indicates no known allergies.  Home Medications   Current Outpatient Rx  Name  Route  Sig  Dispense  Refill  . furosemide (LASIX) 40 MG tablet      TAKE 1 TABLET BY MOUTH TWICE A DAY   60 tablet   5   . gabapentin (NEURONTIN) 300 MG capsule   Oral   Take 300 mg by mouth 2 (two) times daily.          . insulin aspart (NOVOLOG) 100 UNIT/ML injection   Subcutaneous   Inject 40-300 Units into the skin 3 (three) times daily before meals. Sliding scale.         . insulin glargine (LANTUS) 100 UNIT/ML injection   Subcutaneous   Inject 70 Units into the skin at bedtime.          [name] lisinopril-hydrochlorothiazide (PRINZIDE,ZESTORETIC) 20-12.5 MG per tablet   Oral   Take 1 tablet by mouth daily.          . metaxalone (SKELAXIN) 800 MG tablet   Oral   Take 800  mg by mouth daily.         . Multiple Vitamin (MULTIVITAMIN WITH MINERALS) TABS   Oral   Take 1 tablet by mouth daily.         [name] oxycodone (ROXICODONE) 30 MG immediate release tablet   Oral   Take 30 mg by mouth every 4 (four) hours as needed. Pain         . oxyCODONE-acetaminophen (PERCOCET/ROXICET) 5-325 MG per tablet   Oral   Take 1-2 tablets by mouth every 6 (six) hours as needed for pain.         . potassium chloride SA (K-DUR,KLOR-CON) 20 MEQ tablet   Oral   Take 20 mEq by mouth 2 (two) times daily.         . rosuvastatin (CRESTOR) 10 MG tablet   Oral   Take 10 mg by mouth every evening.         [name] spironolactone (ALDACTONE) 25 MG tablet   Oral   Take 25 mg by mouth every evening.          . warfarin (COUMADIN) 5 MG tablet   Oral   Take 5-7.5 mg by mouth daily. Take 7.5 mg once each day on Thursday and Saturday; all other days take 5 mg once daily.          [name] EXPIRED: potassium chloride SA (K-DUR,KLOR-CON) 20 MEQ tablet   Oral   Take 20 mEq by mouth 2 (two) times daily.            BP 132/73  Pulse 98  Temp(Src) 98.3 F (36.8 C) (Oral)  Resp 20  SpO2 97%  Physical Exam  Constitutional: He is oriented to person, place, and time. He appears well-developed and well-nourished. No distress.  Pulmonary/Chest: Effort normal.  Abdominal: Soft. There is no tenderness.  Genitourinary:  Open ulceration at superior gluteal cleft that is chronic in appearance. No active drainage. Tenderness extends along left buttock toward and including perirectal area. No induration or fluctuance palpable. No hemorrhoidal tissue.   Musculoskeletal: Normal range of motion. He exhibits no edema.  Neurological: He is alert and oriented to person, place, and time.  Skin: Skin is warm and dry.  Psychiatric: He has a normal mood and affect.    ED Course  Procedures (including critical care time)  Labs Reviewed  CBC WITH DIFFERENTIAL  BASIC METABOLIC PANEL  PROTIME-INR  APTT   No results found.   No diagnosis found.    MDM  Patient has a history of perirectal abscess requiring surgery, history of diabetes with chronic sacral wound that is increasing in pain over the last several days. No palpable drainable abscess but there is concern for fistulizing infection. CT planned. Patient care transferred to Marlon Pel, PA-C.        Arnoldo Hooker, PA-C 09/06/12 1951

## 2012-09-06 NOTE — ED Provider Notes (Signed)
Medical screening examination/treatment/procedure(s) were conducted as a shared visit with non-physician practitioner(s) and myself.  I personally evaluated the patient during the encounter    Loren Racer, MD 09/06/12 2332

## 2012-09-06 NOTE — ED Notes (Signed)
Admitting MD at bedside.

## 2012-09-06 NOTE — ED Notes (Signed)
PA at bedside.

## 2012-09-07 ENCOUNTER — Ambulatory Visit: Payer: Medicare Other | Admitting: Cardiology

## 2012-09-07 ENCOUNTER — Telehealth: Payer: Self-pay | Admitting: Cardiology

## 2012-09-07 DIAGNOSIS — L0501 Pilonidal cyst with abscess: Secondary | ICD-10-CM

## 2012-09-07 LAB — PROTIME-INR
INR: 1.65 — ABNORMAL HIGH (ref 0.00–1.49)
Prothrombin Time: 19 seconds — ABNORMAL HIGH (ref 11.6–15.2)

## 2012-09-07 LAB — CBC
HCT: 34.1 % — ABNORMAL LOW (ref 39.0–52.0)
Hemoglobin: 11.6 g/dL — ABNORMAL LOW (ref 13.0–17.0)
MCH: 27.4 pg (ref 26.0–34.0)
MCHC: 34 g/dL (ref 30.0–36.0)
RDW: 13.8 % (ref 11.5–15.5)

## 2012-09-07 LAB — BASIC METABOLIC PANEL
BUN: 20 mg/dL (ref 6–23)
Chloride: 98 mEq/L (ref 96–112)
Creatinine, Ser: 1.22 mg/dL (ref 0.50–1.35)
GFR calc non Af Amer: 63 mL/min — ABNORMAL LOW (ref >90)
Glucose, Bld: 196 mg/dL — ABNORMAL HIGH (ref 70–99)
Potassium: 3.8 mEq/L (ref 3.5–5.1)

## 2012-09-07 MED ORDER — SODIUM CHLORIDE 0.9 % IJ SOLN
3.0000 mL | Freq: Two times a day (BID) | INTRAMUSCULAR | Status: DC
  Administered 2012-09-07 – 2012-09-09 (×3): 3 mL via INTRAVENOUS

## 2012-09-07 MED ORDER — ATORVASTATIN CALCIUM 20 MG PO TABS
20.0000 mg | ORAL_TABLET | Freq: Every day | ORAL | Status: DC
  Administered 2012-09-07 – 2012-09-09 (×3): 20 mg via ORAL
  Filled 2012-09-07 (×4): qty 1

## 2012-09-07 MED ORDER — GABAPENTIN 300 MG PO CAPS
300.0000 mg | ORAL_CAPSULE | Freq: Two times a day (BID) | ORAL | Status: DC
  Administered 2012-09-07 – 2012-09-08 (×4): 300 mg via ORAL
  Filled 2012-09-07 (×5): qty 1

## 2012-09-07 MED ORDER — LISINOPRIL-HYDROCHLOROTHIAZIDE 20-12.5 MG PO TABS: 1.0000 | ORAL_TABLET | Freq: Every day | ORAL | Status: DC

## 2012-09-07 MED ORDER — ACETAMINOPHEN 650 MG RE SUPP: 650.0000 mg | Freq: Four times a day (QID) | RECTAL | Status: DC | PRN

## 2012-09-07 MED ORDER — PNEUMOCOCCAL VAC POLYVALENT 25 MCG/0.5ML IJ INJ
0.5000 mL | INJECTION | INTRAMUSCULAR | Status: AC
  Administered 2012-09-07: 0.5 mL via INTRAMUSCULAR
  Filled 2012-09-07 (×2): qty 0.5

## 2012-09-07 MED ORDER — ACETAMINOPHEN 325 MG PO TABS: 650.0000 mg | ORAL_TABLET | Freq: Four times a day (QID) | ORAL | Status: DC | PRN

## 2012-09-07 MED ORDER — VANCOMYCIN HCL 10 G IV SOLR
1500.0000 mg | Freq: Once | INTRAVENOUS | Status: AC
  Administered 2012-09-07: 1500 mg via INTRAVENOUS
  Filled 2012-09-07: qty 1500

## 2012-09-07 MED ORDER — WARFARIN SODIUM 7.5 MG PO TABS
7.5000 mg | ORAL_TABLET | Freq: Once | ORAL | Status: AC
  Administered 2012-09-07: 7.5 mg via ORAL
  Filled 2012-09-07: qty 1

## 2012-09-07 MED ORDER — INSULIN ASPART 100 UNIT/ML ~~LOC~~ SOLN
0.0000 [IU] | Freq: Every day | SUBCUTANEOUS | Status: DC
  Administered 2012-09-07: 4 [IU] via SUBCUTANEOUS
  Administered 2012-09-08 – 2012-09-09 (×2): 3 [IU] via SUBCUTANEOUS

## 2012-09-07 MED ORDER — HYDROCHLOROTHIAZIDE 12.5 MG PO CAPS
12.5000 mg | ORAL_CAPSULE | Freq: Every day | ORAL | Status: DC
  Administered 2012-09-07 – 2012-09-10 (×4): 12.5 mg via ORAL
  Filled 2012-09-07 (×4): qty 1

## 2012-09-07 MED ORDER — HEPARIN BOLUS VIA INFUSION
1000.0000 [IU] | Freq: Once | INTRAVENOUS | Status: AC
  Administered 2012-09-07: 1000 [IU] via INTRAVENOUS
  Filled 2012-09-07: qty 1000

## 2012-09-07 MED ORDER — VANCOMYCIN HCL 10 G IV SOLR
1500.0000 mg | Freq: Two times a day (BID) | INTRAVENOUS | Status: DC
  Administered 2012-09-07 – 2012-09-10 (×6): 1500 mg via INTRAVENOUS
  Filled 2012-09-07 (×7): qty 1500

## 2012-09-07 MED ORDER — HYDROMORPHONE HCL PF 1 MG/ML IJ SOLN
1.0000 mg | INTRAMUSCULAR | Status: DC | PRN
  Administered 2012-09-07 – 2012-09-09 (×18): 1 mg via INTRAVENOUS
  Filled 2012-09-07 (×19): qty 1

## 2012-09-07 MED ORDER — PIPERACILLIN-TAZOBACTAM 3.375 G IVPB
3.3750 g | Freq: Three times a day (TID) | INTRAVENOUS | Status: DC
  Administered 2012-09-07 – 2012-09-10 (×11): 3.375 g via INTRAVENOUS
  Filled 2012-09-07 (×13): qty 50

## 2012-09-07 MED ORDER — ADULT MULTIVITAMIN W/MINERALS CH
1.0000 | ORAL_TABLET | Freq: Every day | ORAL | Status: DC
  Administered 2012-09-07 – 2012-09-10 (×4): 1 via ORAL
  Filled 2012-09-07 (×4): qty 1

## 2012-09-07 MED ORDER — WARFARIN - PHARMACIST DOSING INPATIENT: Freq: Every day | Status: DC

## 2012-09-07 MED ORDER — METAXALONE 800 MG PO TABS
800.0000 mg | ORAL_TABLET | Freq: Every day | ORAL | Status: DC
  Administered 2012-09-07 – 2012-09-10 (×4): 800 mg via ORAL
  Filled 2012-09-07 (×4): qty 1

## 2012-09-07 MED ORDER — INSULIN ASPART 100 UNIT/ML ~~LOC~~ SOLN
0.0000 [IU] | Freq: Three times a day (TID) | SUBCUTANEOUS | Status: DC
  Administered 2012-09-07: 11 [IU] via SUBCUTANEOUS
  Administered 2012-09-07: 4 [IU] via SUBCUTANEOUS
  Administered 2012-09-07: 7 [IU] via SUBCUTANEOUS
  Administered 2012-09-08: 11 [IU] via SUBCUTANEOUS
  Administered 2012-09-08: 7 [IU] via SUBCUTANEOUS
  Administered 2012-09-08: 15 [IU] via SUBCUTANEOUS
  Administered 2012-09-09 (×3): 11 [IU] via SUBCUTANEOUS
  Administered 2012-09-10 (×2): 7 [IU] via SUBCUTANEOUS

## 2012-09-07 MED ORDER — ONDANSETRON HCL 4 MG PO TABS: 4.0000 mg | ORAL_TABLET | Freq: Four times a day (QID) | ORAL | Status: DC | PRN

## 2012-09-07 MED ORDER — HEPARIN BOLUS VIA INFUSION
2000.0000 [IU] | Freq: Once | INTRAVENOUS | Status: AC
  Administered 2012-09-07: 2000 [IU] via INTRAVENOUS
  Filled 2012-09-07: qty 2000

## 2012-09-07 MED ORDER — PIPERACILLIN-TAZOBACTAM IN DEX 2-0.25 GM/50ML IV SOLN
2.2500 g | Freq: Four times a day (QID) | INTRAVENOUS | Status: DC
  Filled 2012-09-07: qty 50

## 2012-09-07 MED ORDER — ONDANSETRON HCL 4 MG/2ML IJ SOLN: 4.0000 mg | Freq: Four times a day (QID) | INTRAMUSCULAR | Status: DC | PRN

## 2012-09-07 MED ORDER — INSULIN ASPART 100 UNIT/ML ~~LOC~~ SOLN
4.0000 [IU] | Freq: Three times a day (TID) | SUBCUTANEOUS | Status: DC
  Administered 2012-09-07 – 2012-09-08 (×3): 4 [IU] via SUBCUTANEOUS

## 2012-09-07 MED ORDER — SODIUM CHLORIDE 0.9 % IV SOLN
INTRAVENOUS | Status: DC
  Administered 2012-09-09: 10 mL/h via INTRAVENOUS

## 2012-09-07 MED ORDER — OXYCODONE HCL 5 MG PO TABS
30.0000 mg | ORAL_TABLET | ORAL | Status: DC | PRN
  Administered 2012-09-07 – 2012-09-08 (×4): 30 mg via ORAL
  Filled 2012-09-07 (×2): qty 6
  Filled 2012-09-07: qty 5
  Filled 2012-09-07: qty 6
  Filled 2012-09-07: qty 1

## 2012-09-07 MED ORDER — LISINOPRIL 20 MG PO TABS
20.0000 mg | ORAL_TABLET | Freq: Every day | ORAL | Status: DC
  Administered 2012-09-07 – 2012-09-10 (×4): 20 mg via ORAL
  Filled 2012-09-07 (×4): qty 1

## 2012-09-07 MED ORDER — INSULIN GLARGINE 100 UNIT/ML ~~LOC~~ SOLN
70.0000 [IU] | Freq: Every day | SUBCUTANEOUS | Status: DC
  Administered 2012-09-07 – 2012-09-09 (×3): 70 [IU] via SUBCUTANEOUS

## 2012-09-07 MED ORDER — VANCOMYCIN HCL 10 G IV SOLR: 2000.0000 mg | INTRAVENOUS | Status: DC

## 2012-09-07 MED ORDER — PNEUMOCOCCAL 13-VAL CONJ VACC IM SUSP: 0.5000 mL | INTRAMUSCULAR | Status: DC

## 2012-09-07 MED ORDER — HEPARIN (PORCINE) IN NACL 100-0.45 UNIT/ML-% IJ SOLN
1950.0000 [IU]/h | INTRAMUSCULAR | Status: DC
  Administered 2012-09-07: 1950 [IU]/h via INTRAVENOUS
  Filled 2012-09-07 (×3): qty 250

## 2012-09-07 MED ORDER — HEPARIN (PORCINE) IN NACL 100-0.45 UNIT/ML-% IJ SOLN
1400.0000 [IU]/h | INTRAMUSCULAR | Status: DC
  Administered 2012-09-07: 1400 [IU]/h via INTRAVENOUS
  Filled 2012-09-07: qty 250

## 2012-09-07 NOTE — Telephone Encounter (Signed)
New problem   Pt wanted to let you know he was admitted in the hospital at [facility]

## 2012-09-07 NOTE — Progress Notes (Signed)
ANTICOAGULATION CONSULT NOTE   Pharmacy Consult for Heparin Indication: atrial fibrillation  No Known Allergies  Patient Measurements: Height: 6' (182.9 cm) Weight: 377 lb 3.3 oz (171.1 kg) IBW/kg (Calculated) : 77.6 Heparin Dosing Weight: 105 kg  Vital Signs: Temp: 98.7 F (37.1 C) (02/27 0500) Temp src: Oral (02/27 0500) BP: 131/63 mmHg (02/27 0500) Pulse Rate: 73 (02/27 0500)  Labs:  Recent Labs  09/06/12 2000 09/07/12 0522 09/07/12 0903  HGB 12.0* 11.6*  --   HCT 35.9* 34.1*  --   PLT 268 271  --   APTT 44*  --   --   LABPROT 17.8* 19.0*  --   INR 1.51* 1.65*  --   HEPARINUNFRC  --   --  <0.10*  CREATININE 1.50* 1.22  --     Estimated Creatinine Clearance: 104.7 ml/min (by C-G formula based on Cr of 1.22).   Medical History: Past Medical History  Diagnosis Date  . Diabetes mellitus   . Hypertension   . Rheumatoid arthritis   . Neuropathy   . CHF (congestive heart failure)   . History of MI (myocardial infarction)     SILENT MI IN PAST   . CAD (coronary artery disease)   . Atrial fibrillation   . Anemia   . Sleep apnea      SEVERE, NO CPAP USED, DOES NOT KNOW  WHERE SLEEP STUDY DONE    Medications:  Scheduled:  . [COMPLETED] sodium chloride   Intravenous Once  . atorvastatin  20 mg Oral q1800  . gabapentin  300 mg Oral BID  . [COMPLETED] heparin  2,000 Units Intravenous Once  . lisinopril  20 mg Oral Daily   And  . hydrochlorothiazide  12.5 mg Oral Daily  . [COMPLETED]  HYDROmorphone (DILAUDID) injection  1 mg Intravenous Once  . [COMPLETED]  HYDROmorphone (DILAUDID) injection  1 mg Intravenous Once  . insulin aspart  0-20 Units Subcutaneous TID WC  . [COMPLETED] insulin aspart  10 Units Subcutaneous Once  . [COMPLETED] insulin glargine  70 Units Subcutaneous Once  . insulin glargine  70 Units Subcutaneous QHS  . metaxalone  800 mg Oral Daily  . multivitamin with minerals  1 tablet Oral Daily  . piperacillin-tazobactam (ZOSYN)  IV  3.375 g  Intravenous Q8H  . [COMPLETED] pneumococcal 23 valent vaccine  0.5 mL Intramuscular Tomorrow-1000  . [COMPLETED] sodium chloride  1,000 mL Intravenous Once  . sodium chloride  3 mL Intravenous Q12H  . [COMPLETED] vancomycin  1,500 mg Intravenous Once  . vancomycin  1,500 mg Intravenous Q12H  . [COMPLETED] vancomycin  1,000 mg Intravenous Once  . [DISCONTINUED] sodium chloride   Intravenous STAT  . [DISCONTINUED] insulin regular  10 Units Intravenous Once  . [DISCONTINUED] insulin regular  10 Units Subcutaneous Once  . [DISCONTINUED] lisinopril-hydrochlorothiazide  1 tablet Oral Daily  . [DISCONTINUED] piperacillin-tazobactam (ZOSYN)  IV  2.25 g Intravenous Q6H  . [DISCONTINUED] pneumococcal 13-valent conjugate vaccine  0.5 mL Intramuscular Tomorrow-1000  . [DISCONTINUED] vancomycin  2,000 mg Intravenous Q24H   Infusions:  . sodium chloride 10 mL/hr at 09/07/12 0152  . heparin 1,400 Units/hr (09/07/12 0300)    Assessment: 62 yo M on chronic warfarin for atrial fibrillation, admitted with infected pilonidal cyst.  Warfarin was held in anticipation of possible need for I&D.  IV heparin was ordered for interim anticoagulant coverage.  Heparin level this AM reported as undetectable on 1400 units/hr.  Infusion is running at proper rate.  Patient and RN deny  any significant problems with the infusion.  No bleeding reported.  Goal of Therapy:  Heparin level 0.3-0.7 units/ml Monitor platelets by anticoagulation protocol: Yes   Plan:  1. Repeat heparin 2000 unit bolus IV x1. 2. Increase heparin infusion to 1950 units/hr. 3. Recheck heparin level at 5pm.  Elie Goody, PharmD, BCPS Pager: [phone] 09/07/2012  10:42 AM

## 2012-09-07 NOTE — Progress Notes (Signed)
ANTIBIOTIC CONSULT NOTE - INITIAL  Pharmacy Consult for Vancomycin and Zosyn Indication: Perirectal cellulitis/abcess  No Known Allergies  Patient Measurements: Height: 6' (182.9 cm) Weight: 377 lb 3.3 oz (171.1 kg) IBW/kg (Calculated) : 77.6 Adjusted Body Weight: 105 kg  Vital Signs: Temp: 98 F (36.7 C) (02/27 0142) Temp src: Oral (02/27 0142) BP: 140/61 mmHg (02/27 0142) Pulse Rate: 89 (02/27 0142) Intake/Output from previous day: 02/26 0701 - 02/27 0700 In: 1003 [I.V.:1003] Out: -  Intake/Output from this shift: Total I/O In: 1003 [I.V.:1003] Out: -   Labs:  Recent Labs  09/06/12 2000  WBC 14.0*  HGB 12.0*  PLT 268  CREATININE 1.50*   Estimated Creatinine Clearance: 85.2 ml/min (by C-G formula based on Cr of 1.5). No results found for this basename: VANCOTROUGH, VANCOPEAK, VANCORANDOM, GENTTROUGH, GENTPEAK, GENTRANDOM, TOBRATROUGH, TOBRAPEAK, TOBRARND, AMIKACINPEAK, AMIKACINTROU, AMIKACIN,  in the last 72 hours   Microbiology: No results found for this or any previous visit (from the past 720 hour(s)).  Medical History: Past Medical History  Diagnosis Date  . Diabetes mellitus   . Hypertension   . Rheumatoid arthritis   . Neuropathy   . CHF (congestive heart failure)   . History of MI (myocardial infarction)     SILENT MI IN PAST   . CAD (coronary artery disease)   . Atrial fibrillation   . Anemia   . Sleep apnea      SEVERE, NO CPAP USED, DOES NOT KNOW  WHERE SLEEP STUDY DONE    Medications:  Scheduled:  . [COMPLETED] sodium chloride   Intravenous Once  . atorvastatin  20 mg Oral q1800  . gabapentin  300 mg Oral BID  . heparin  2,000 Units Intravenous Once  . lisinopril  20 mg Oral Daily   And  . hydrochlorothiazide  12.5 mg Oral Daily  . [COMPLETED]  HYDROmorphone (DILAUDID) injection  1 mg Intravenous Once  . [COMPLETED]  HYDROmorphone (DILAUDID) injection  1 mg Intravenous Once  . insulin aspart  0-20 Units Subcutaneous TID WC  .  [COMPLETED] insulin aspart  10 Units Subcutaneous Once  . [COMPLETED] insulin glargine  70 Units Subcutaneous Once  . insulin glargine  70 Units Subcutaneous QHS  . metaxalone  800 mg Oral Daily  . multivitamin with minerals  1 tablet Oral Daily  . piperacillin-tazobactam (ZOSYN)  IV  3.375 g Intravenous Q8H  . pneumococcal 23 valent vaccine  0.5 mL Intramuscular Tomorrow-1000  . [COMPLETED] sodium chloride  1,000 mL Intravenous Once  . sodium chloride  3 mL Intravenous Q12H  . vancomycin  1,500 mg Intravenous Once  . [COMPLETED] vancomycin  1,000 mg Intravenous Once  . [DISCONTINUED] sodium chloride   Intravenous STAT  . [DISCONTINUED] insulin regular  10 Units Intravenous Once  . [DISCONTINUED] insulin regular  10 Units Subcutaneous Once  . [DISCONTINUED] lisinopril-hydrochlorothiazide  1 tablet Oral Daily  . [DISCONTINUED] piperacillin-tazobactam (ZOSYN)  IV  2.25 g Intravenous Q6H  . [DISCONTINUED] pneumococcal 13-valent conjugate vaccine  0.5 mL Intramuscular Tomorrow-1000   Infusions:  . sodium chloride 10 mL/hr at 09/07/12 0152  . heparin     Assessment: 62 yo with perirectal cellulitis/abscess.  MD ordering Vancomycin and Zosyn per Rx.  Goal of Therapy:  Vancomycin trough level 15-20 mcg/ml  Plan:   Zosyn 3.375 Gm IV q8h EI infusion  Vancomycin 2500mg  load (1Gm x1 in ER ~2330 then 1500mg  IV x1 ~0300)  Vancomycin 2000mg  IV q24h. CrCl~53 Dorris Carnes)  Susanne Greenhouse R 09/07/2012,2:43 AM

## 2012-09-07 NOTE — Telephone Encounter (Signed)
Noted, dr Shella Spearing aware

## 2012-09-07 NOTE — ED Provider Notes (Signed)
Date: 09/07/2012  Rate: 86  Rhythm: sinus rhythm  QRS Axis: normal  Intervals: normal  ST/T Wave abnormalities: borderline T wave abnormalities  Conduction Disutrbances:none  Narrative Interpretation:   Old EKG Reviewed: unchanged from Jun 18, 2009      Dorthula Matas, [place] 09/07/12 0003

## 2012-09-07 NOTE — Progress Notes (Signed)
ANTICOAGULATION CONSULT NOTE - Initial Consult  Pharmacy Consult for Heparin Indication: atrial fibrillation  No Known Allergies  Patient Measurements: Height: 6' (182.9 cm) Weight: 377 lb 3.3 oz (171.1 kg) IBW/kg (Calculated) : 77.6 Heparin Dosing Weight: 105 kg  Vital Signs: Temp: 98 F (36.7 C) (02/27 0142) Temp src: Oral (02/27 0142) BP: 140/61 mmHg (02/27 0142) Pulse Rate: 89 (02/27 0142)  Labs:  Recent Labs  09/06/12 2000  HGB 12.0*  HCT 35.9*  PLT 268  APTT 44*  LABPROT 17.8*  INR 1.51*  CREATININE 1.50*    Estimated Creatinine Clearance: 85.2 ml/min (by C-G formula based on Cr of 1.5).   Medical History: Past Medical History  Diagnosis Date  . Diabetes mellitus   . Hypertension   . Rheumatoid arthritis   . Neuropathy   . CHF (congestive heart failure)   . History of MI (myocardial infarction)     SILENT MI IN PAST   . CAD (coronary artery disease)   . Atrial fibrillation   . Anemia   . Sleep apnea      SEVERE, NO CPAP USED, DOES NOT KNOW  WHERE SLEEP STUDY DONE    Medications:  Scheduled:  . [COMPLETED] sodium chloride   Intravenous Once  . atorvastatin  20 mg Oral q1800  . gabapentin  300 mg Oral BID  . heparin  2,000 Units Intravenous Once  . lisinopril  20 mg Oral Daily   And  . hydrochlorothiazide  12.5 mg Oral Daily  . [COMPLETED]  HYDROmorphone (DILAUDID) injection  1 mg Intravenous Once  . [COMPLETED]  HYDROmorphone (DILAUDID) injection  1 mg Intravenous Once  . insulin aspart  0-20 Units Subcutaneous TID WC  . [COMPLETED] insulin aspart  10 Units Subcutaneous Once  . [COMPLETED] insulin glargine  70 Units Subcutaneous Once  . insulin glargine  70 Units Subcutaneous QHS  . metaxalone  800 mg Oral Daily  . multivitamin with minerals  1 tablet Oral Daily  . piperacillin-tazobactam (ZOSYN)  IV  3.375 g Intravenous Q8H  . [START ON 09/08/2012] pneumococcal 13-valent conjugate vaccine  0.5 mL Intramuscular Tomorrow-1000  . [COMPLETED]  sodium chloride  1,000 mL Intravenous Once  . sodium chloride  3 mL Intravenous Q12H  . [COMPLETED] vancomycin  1,000 mg Intravenous Once  . [DISCONTINUED] sodium chloride   Intravenous STAT  . [DISCONTINUED] insulin regular  10 Units Intravenous Once  . [DISCONTINUED] insulin regular  10 Units Subcutaneous Once  . [DISCONTINUED] lisinopril-hydrochlorothiazide  1 tablet Oral Daily  . [DISCONTINUED] piperacillin-tazobactam (ZOSYN)  IV  2.25 g Intravenous Q6H   Infusions:  . sodium chloride 10 mL/hr at 09/07/12 0152  . heparin      Assessment: 62 yo on chronic coumadin for A-fib, admitted with possible buttock cellulitis/abscess.  MD starting IV Heparin in case I&D required.  Goal of Therapy:  Heparin level 0.3-0.7 units/ml Monitor platelets by anticoagulation protocol: Yes   Plan:   Heparin 2000 unit bolus IV x1  Start drip @ 1400 units/hr.  Daily CBC/Heparin level.  Check 1st HL in 6 hours.  Lorenza Evangelist 09/07/2012,2:26 AM

## 2012-09-07 NOTE — Consult Note (Signed)
Reason for Consult:buttock cellulitis Referring Physician: [name] is an 62 y.o. male.  HPI:  We were asked to evaluate this patient for possible buttock cellulitis an abscess. He has had a chronic wound in the presacral area is for many years which has intermittently become tender and inflamed but each time previously has drained spontaneously. He says that he has lanced this a few times himself. This most recent episode started about 4-5 days ago where he began having some increased discomfort in the area of his chronic wound of in the buttock region. He says this has increased where he thought that he had a perirectal abscess similar to a prior pararectal abscess in the 1980s. He is diabetic and he says his last hemoglobin A1c was 7.8. He has not had any fevers or chills. He does have some chronic drainage from the area.  Past Medical History  Diagnosis Date  . Diabetes mellitus   . Hypertension   . Rheumatoid arthritis   . Neuropathy   . CHF (congestive heart failure)   . History of MI (myocardial infarction)     SILENT MI IN PAST   . CAD (coronary artery disease)   . Atrial fibrillation   . Anemia   . Sleep apnea      SEVERE, NO CPAP USED, DOES NOT KNOW  WHERE SLEEP STUDY DONE    Past Surgical History  Procedure Laterality Date  . Abdominal surgery  AGE 60 MONTHS    BENIGN TUMOR REMOVED  . Debridement  foot  FEW YRS AGO    LEFT FOOT, WOUNDS ALL HEALED NOW  . Coronary angioplasty with stent placement  FEB 2013     DONE AT [facility] [place]    Family History  Problem Relation Age of Onset  . Other Other     PT ADOPTED    Social History:  reports that he quit smoking about 29 years ago. His smoking use included Cigarettes. He has a 20 pack-year smoking history. He has never used smokeless tobacco. He reports that he does not drink alcohol or use illicit drugs.  Allergies: No Known Allergies  Medications: see EPIC  Results for orders placed during the  hospital encounter of 09/06/12 (from the past 48 hour(s))  CBC WITH DIFFERENTIAL     Status: Abnormal   Collection Time    09/06/12  8:00 PM      Result Value Range   WBC 14.0 (*) 4.0 - 10.5 K/uL   RBC 4.46  4.22 - 5.81 MIL/uL   Hemoglobin 12.0 (*) 13.0 - 17.0 g/dL   HCT 16.1 (*) 09.6 - 04.5 %   MCV 80.5  78.0 - 100.0 fL   MCH 26.9  26.0 - 34.0 pg   MCHC 33.4  30.0 - 36.0 g/dL   RDW 40.9  81.1 - 91.4 %   Platelets 268  150 - 400 K/uL   Neutrophils Relative 67  43 - 77 %   Neutro Abs 9.4 (*) 1.7 - 7.7 K/uL   Lymphocytes Relative 22  12 - 46 %   Lymphs Abs 3.0  0.7 - 4.0 K/uL   Monocytes Relative 8  3 - 12 %   Monocytes Absolute 1.1 (*) 0.1 - 1.0 K/uL   Eosinophils Relative 3  0 - 5 %   Eosinophils Absolute 0.4  0.0 - 0.7 K/uL   Basophils Relative 0  0 - 1 %   Basophils Absolute 0.1  0.0 - 0.1 K/uL  BASIC METABOLIC PANEL     Status: Abnormal   Collection Time    09/06/12  8:00 PM      Result Value Range   Sodium 132 (*) 135 - 145 mEq/L   Potassium 4.4  3.5 - 5.1 mEq/L   Chloride 94 (*) 96 - 112 mEq/L   CO2 24  19 - 32 mEq/L   Glucose, Bld 409 (*) 70 - 99 mg/dL   BUN 24 (*) 6 - 23 mg/dL   Creatinine, Ser 1.61 (*) 0.50 - 1.35 mg/dL   Calcium 9.0  8.4 - 09.6 mg/dL   GFR calc non Af Amer 49 (*) >90 mL/min   GFR calc Af Amer 57 (*) >90 mL/min   Comment:            The eGFR has been calculated     using the CKD EPI equation.     This calculation has not been     validated in all clinical     situations.     eGFR's persistently     <90 mL/min signify     possible Chronic Kidney Disease.  PROTIME-INR     Status: Abnormal   Collection Time    09/06/12  8:00 PM      Result Value Range   Prothrombin Time 17.8 (*) 11.6 - 15.2 seconds   INR 1.51 (*) 0.00 - 1.49  APTT     Status: Abnormal   Collection Time    09/06/12  8:00 PM      Result Value Range   aPTT 44 (*) 24 - 37 seconds   Comment:            IF BASELINE aPTT IS ELEVATED,     SUGGEST PATIENT RISK ASSESSMENT      BE USED TO DETERMINE APPROPRIATE     ANTICOAGULANT THERAPY.  GLUCOSE, CAPILLARY     Status: Abnormal   Collection Time    09/06/12 10:10 PM      Result Value Range   Glucose-Capillary 304 (*) 70 - 99 mg/dL    Ct Pelvis W Contrast  09/06/2012  *RADIOLOGY REPORT*  Clinical Data:  The patient had a perianal abscess 3 months ago that was treated.  There was initial improvement but now has worsened.  CT PELVIS WITH CONTRAST  Technique:  Multidetector CT imaging of the pelvis was performed using the standard protocol following the bolus administration of intravenous contrast.  Contrast: OMNIPAQUE IOHEXOL 300 MG/ML  SOLN  Comparison:   None.  Findings:  CT examination of the pelvis is obtained with IV and rectal contrast material. Technically limited study due to the patient's body habitus. The There is infiltration in the perineal/perianal soft tissues to the left of midline extending from the region of the anus inferiorly.  This measures maximally about 16 x 66 mm.  There is associated skin thickening.  This could represent cellulitis or fistula.  No discrete tract is identified. There is no evidence of fluid collection to suggest abscess.  No soft tissue gas collections.  No contrast extravasation is noted.  Pelvic organs appear intact.  Bladder wall is not thickened. Prostate gland is not enlarged.  No free or loculated pelvic fluid collections.  Diverticula in the sigmoid colon without diverticulitis.  The appendix is not identified.  IMPRESSION: Soft tissue infiltration in the left peri anal and inferior perineal fat with skin thickening suggesting cellulitis.  No fluid collection to suggest abscess.   Original Report Authenticated By: Paul Noa  Andria [name], M.D.    All other review of systems negative or noncontributory except as stated in the HPI   Blood pressure 140/65, pulse 101, temperature 98.3 F (36.8 C), temperature source Oral, resp. rate 13, SpO2 98.00%. General appearance: alert,  cooperative, no distress and morbidly obese Resp: nonlabored Cardio: tachycardic Skin: skin very dry and discolored. In the presacral region he has some erythema and induration around some midline skin pits and some slight drainage, but this appears chronic and there is no fluctuance and I tried to probe the pits but this was not well tolerated.  There does not appear to be any crepitus or necrotizing infection, I do not see any sign of perirectal abscess Neurologic: Grossly normal  Assessment/Plan: Pilonidal cyst with chronic infection I think that this is most likely a chronic pilonidal cyst. He has had increased pain in the area of I do not see any obvious drainable fluid collections on exam. He also had a CT scan which also did not demonstrate any obvious fluid collections. He does have a slight amount of pearly and drainage from the largest skin pit and there is a decent chance that he does have an associated abscess although this does not obvious on exam and CT scan at the current time. Also his INR is 1.51. I think it is reasonable for admission and improvement in his associated medical comorbidities and improved glucose control as well as IV antibiotics and serial exams of this area. If he does not improve rapidly or if he develops any sign of an obvious abscess then he may need formal incision and drainage. Surgery will follow along.  Lodema Pilot DAVID 09/07/2012, 1:13 AM

## 2012-09-07 NOTE — Progress Notes (Addendum)
[name] PROGRESS NOTE  KNOWLEDGE ESCANDON ZOX:[id number] DOB: [birth year] DOA: 09/06/2012 PCP: Jeri Cos, MD  Assessment/Plan  Pilonidal cyst with infection and surrounding cellulitis, improving on IV abx and nothing to drain per exam and CT pelvis -  Continue vancomycin and zosyn -  Appreciate surgery assistance  -  Continue oxycodone for pain   Uncontrolled T2DM, likely due to insulin resistance in the setting of infection.  -  Continue lantus 70 units BID -  Add aspart 4 units AC -  Continue high dose SSI -  Add HS insulin -  A1c   Atrial fibrillation, currently in atrial fibrillation on telemetry with rate controlled. -  D/c telemetry -  Coumadin dosing per pharmacy with bridging heparin gtt  (per surgery, nothing to drain so procedure is less likely) -  Not on rate or rhythm control medications.  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 [name] notes:  Will discuss with patient. -  Restart lasix/spironolactone prn  Diabetic neuropathy, stable.  Cont gabapentin  Leukocytosis, trending down on antibiotics, likely related to cellulitis Normocytic anemia, stable.  Tx for hgb < 7 -  Defer further evaluation to PCP if not already complete CKD stage 3, stable.    Diet:  diabetic Access:  piv IVF:  kvo Proph:  Heparin gtt  Code Status: full code Family Communication: spoke with patient alone Disposition Plan: pending further improvement in cellulitis   Consultants:  General surgery  Procedures:  CT pelvis   Antibiotics:  vanc 2/27 >>  Zosyn 2/27 >>   HPI/Subjective:  Denies fevers, chills, headache.  Denies chest pain and shortness of breath.  Denies nausea, vomiting, diarrhea.  Had BM overnight which was formed but no blood  or melena.  Has chronic constipation due to narcotic use.  Pain is improved since starting antibiotics and pain medication.      Objective: Filed Vitals:   09/07/12 0142 09/07/12 0155 09/07/12 0500 09/07/12 1317  BP: 140/61  131/63 140/49  Pulse: 89  73 84  Temp: 98 F (36.7 C)  98.7 F (37.1 C) 98 F (36.7 C)  TempSrc: Oral  Oral Oral  Resp: 19  19 18   Height: 6" (0.152 m) 6' (1.829 m)    Weight: 171.1 kg (377 lb 3.3 oz)     SpO2: 100%  99% 97%    Intake/Output Summary (Last 24 hours) at 09/07/12 1427 Last data filed at 09/07/12 0800  Gross per 24 hour  Intake 1570.33 ml  Output      0 ml  Net 1570.33 ml   Filed Weights   09/07/12 0142  Weight: 171.1 kg (377 lb 3.3 oz)    Exam:   General:  Obese CM, No acute distress  HEENT:  NCAT, MMM  Cardiovascular:  IRRR, nl S1, S2 no mrg, 2+ pulses, warm extremities  Respiratory:   CTAB, no increased WOB  Abdomen:   NABS, soft, NT/ND  MSK:   Normal tone and bulk, 1+ LEE  Neuro:  Grossly moves all extremities and ambulates during exam  Skin:  Upper buttock crease with draining 5mm opening with foul odor.  Mild surrounding induration versus skin thickening and 10 cm radius of minimal erythema.     Data Reviewed: Basic Metabolic  Panel:  Recent Labs Lab 09/06/12 2000 09/07/12 0522  NA 132* 135  K 4.4 3.8  CL 94* 98  CO2 24 25  GLUCOSE 409* 196*  BUN 24* 20  CREATININE 1.50* 1.22  CALCIUM 9.0 8.7   Liver Function Tests: No results found for this basename: AST, ALT, ALKPHOS, BILITOT, PROT, ALBUMIN,  in the last 168 hours No results found for this basename: LIPASE, AMYLASE,  in the last 168 hours No results found for this basename: AMMONIA,  in the last 168 hours CBC:  Recent Labs Lab 09/06/12 2000 09/07/12 0522  WBC 14.0* 13.6*  NEUTROABS 9.4*  --   HGB 12.0* 11.6*  HCT 35.9* 34.1*  MCV 80.5 80.4  PLT 268 271   Cardiac Enzymes: No results found for this basename: CKTOTAL, CKMB, CKMBINDEX, TROPONINI,   in the last 168 hours BNP (last 3 results) No results found for this basename: PROBNP,  in the last 8760 hours CBG:  Recent Labs Lab 09/06/12 2210 09/07/12 0738 09/07/12 1130  GLUCAP 304* 161* 245*    No results found for this or any previous visit (from the past 240 hour(s)).   Studies: Ct Pelvis W Contrast  09/06/2012  *RADIOLOGY REPORT*  Clinical Data:  The patient had a perianal abscess 3 months ago that was treated.  There was initial improvement but now has worsened.  CT PELVIS WITH CONTRAST  Technique:  Multidetector CT imaging of the pelvis was performed using the standard protocol following the bolus administration of intravenous contrast.  Contrast: OMNIPAQUE IOHEXOL 300 MG/ML  SOLN  Comparison:   None.  Findings:  CT examination of the pelvis is obtained with IV and rectal contrast material. Technically limited study due to the patient's body habitus. The There is infiltration in the perineal/perianal soft tissues to the left of midline extending from the region of the anus inferiorly.  This measures maximally about 16 x 66 mm.  There is associated skin thickening.  This could represent cellulitis or fistula.  No discrete tract is identified. There is no evidence of fluid collection to suggest abscess.  No soft tissue gas collections.  No contrast extravasation is noted.  Pelvic organs appear intact.  Bladder wall is not thickened. Prostate gland is not enlarged.  No free or loculated pelvic fluid collections.  Diverticula in the sigmoid colon without diverticulitis.  The appendix is not identified.  IMPRESSION: Soft tissue infiltration in the left peri anal and inferior perineal fat with skin thickening suggesting cellulitis.  No fluid collection to suggest abscess.   Original Report Authenticated By: Burman Nieves, M.D.     Scheduled Meds: . atorvastatin  20 mg Oral q1800  . gabapentin  300 mg Oral BID  . lisinopril  20 mg Oral Daily   And  . hydrochlorothiazide  12.5 mg  Oral Daily  . insulin aspart  0-20 Units Subcutaneous TID WC  . insulin glargine  70 Units Subcutaneous QHS  . metaxalone  800 mg Oral Daily  . multivitamin with minerals  1 tablet Oral Daily  . piperacillin-tazobactam (ZOSYN)  IV  3.375 g Intravenous Q8H  . sodium chloride  3 mL Intravenous Q12H  . vancomycin  1,500 mg Intravenous Q12H   Continuous Infusions: . sodium chloride 10 mL/hr at 09/07/12 0152  . heparin 1,950 Units/hr (09/07/12 1129)    Principal Problem:   Perirectal cellulitis Active Problems:   DIABETES MELLITUS-TYPE II   Atrial fibrillation   CAD (coronary artery disease)    Time  spent: 30 min    Kong Packett, [facility]  [name] Pager [phone]. If 7PM-7AM, please contact night-coverage at [email or web address], password [facility] 09/07/2012, 2:27 PM  LOS: 1 day

## 2012-09-07 NOTE — Care Management Note (Signed)
    Page 1 of 2   09/10/2012     2:20:16 PM   CARE MANAGEMENT NOTE 09/10/2012  Patient:  [name], [name]   Account Number:  [id number]  Date Initiated:  09/07/2012  Documentation initiated by:  Lanier Clam  Subjective/Objective Assessment:   ADMITTED W/PAIN IN RECTAL AREA.PERIRECTAL CELLULITIS.     Action/Plan:   FROM HOME.NO PCP.HAS PHARMACY.   Anticipated DC Date:  09/10/2012   Anticipated DC Plan:  HOME/SELF CARE  In-house referral  PCP / [facility]      DC Planning Services  CM consult      Choice offered to / List presented to:             Status of service:  Completed, signed off Medicare Important Message given?   (If response is "NO", the following Medicare IM given date fields will be blank) Date Medicare IM given:   Date Additional Medicare IM given:    Discharge Disposition:  HOME/SELF CARE  Per UR Regulation:  Reviewed for med. necessity/level of care/duration of stay  If discussed at Long Length of Stay Meetings, dates discussed:    Comments:  09/10/12 Paul Lecompte RN,BSN NCM [phone] D/C HOME, NO FURTHER NEEDS OR ORDERS.  09/08/12 Paul Lober RN,BSN NCM [phone] [facility] SX FOLLOWING-CONSERVATIVE TREATMENT,NO FURTHER I&D.MD MANAGING PAIN CONTROL ISSUES.PATIENT ASKED ABOUT MEDICAID APPLIC PROCESS.INFORMED THAT HE SHOULD GO TO DSS TO APPLY.ALSO ENCOURAGED PATIENT TO USE THE PHYSICIAN REFERRAL SERVICE FOR PCP.PATIENT IS FOLLOWING UP ON PAIN CLINICS.MD AWARE OF PAIN CONTROL ISSUES.  09/07/12 Paul Chappelle RN,BSN NCM [phone] PROVIDED PATIENT W/PHYSICIAN REFERRAL SERVICE(EXPLAINED A COURTESY FOR USE WHILE IN HOSPITAL),ALSO PROVIDED W/PCP LISTING.PROVIDED W/PAIN CLINICS IN [facility].

## 2012-09-07 NOTE — Progress Notes (Signed)
ANTIBIOTIC CONSULT NOTE  Pharmacy Consult for Vancomycin and Zosyn Indication: Infected pilonidal cyst  No Known Allergies  Patient Measurements: Height: 6' (182.9 cm) Weight: 377 lb 3.3 oz (171.1 kg) IBW/kg (Calculated) : 77.6   Vital Signs: Temp: 98.7 F (37.1 C) (02/27 0500) Temp src: Oral (02/27 0500) BP: 131/63 mmHg (02/27 0500) Pulse Rate: 73 (02/27 0500) Intake/Output from previous day: 02/26 0701 - 02/27 0700 In: 1090.3 [I.V.:1040.3; IV Piggyback:50] Out: -  Intake/Output from this shift: Total I/O In: 480 [P.O.:480] Out: -   Labs:  Recent Labs  09/06/12 2000 09/07/12 0522  WBC 14.0* 13.6*  HGB 12.0* 11.6*  PLT 268 271  CREATININE 1.50* 1.22   Normalized CrCl ~ 65 mL/min/72kg   Microbiology: No results found for this or any previous visit (from the past 720 hour(s)).  Medical History: Past Medical History  Diagnosis Date  . Diabetes mellitus   . Hypertension   . Rheumatoid arthritis   . Neuropathy   . CHF (congestive heart failure)   . History of MI (myocardial infarction)     SILENT MI IN PAST   . CAD (coronary artery disease)   . Atrial fibrillation   . Anemia   . Sleep apnea      SEVERE, NO CPAP USED, DOES NOT KNOW  WHERE SLEEP STUDY DONE    Medications:  Scheduled:  . [COMPLETED] sodium chloride   Intravenous Once  . atorvastatin  20 mg Oral q1800  . gabapentin  300 mg Oral BID  . [COMPLETED] heparin  2,000 Units Intravenous Once  . lisinopril  20 mg Oral Daily   And  . hydrochlorothiazide  12.5 mg Oral Daily  . [COMPLETED]  HYDROmorphone (DILAUDID) injection  1 mg Intravenous Once  . [COMPLETED]  HYDROmorphone (DILAUDID) injection  1 mg Intravenous Once  . insulin aspart  0-20 Units Subcutaneous TID WC  . [COMPLETED] insulin aspart  10 Units Subcutaneous Once  . [COMPLETED] insulin glargine  70 Units Subcutaneous Once  . insulin glargine  70 Units Subcutaneous QHS  . metaxalone  800 mg Oral Daily  . multivitamin with minerals   1 tablet Oral Daily  . piperacillin-tazobactam (ZOSYN)  IV  3.375 g Intravenous Q8H  . pneumococcal 23 valent vaccine  0.5 mL Intramuscular Tomorrow-1000  . [COMPLETED] sodium chloride  1,000 mL Intravenous Once  . sodium chloride  3 mL Intravenous Q12H  . [COMPLETED] vancomycin  1,500 mg Intravenous Once  . vancomycin  1,500 mg Intravenous Q12H  . [COMPLETED] vancomycin  1,000 mg Intravenous Once  . [DISCONTINUED] sodium chloride   Intravenous STAT  . [DISCONTINUED] insulin regular  10 Units Intravenous Once  . [DISCONTINUED] insulin regular  10 Units Subcutaneous Once  . [DISCONTINUED] lisinopril-hydrochlorothiazide  1 tablet Oral Daily  . [DISCONTINUED] piperacillin-tazobactam (ZOSYN)  IV  2.25 g Intravenous Q6H  . [DISCONTINUED] pneumococcal 13-valent conjugate vaccine  0.5 mL Intramuscular Tomorrow-1000  . [DISCONTINUED] vancomycin  2,000 mg Intravenous Q24H   Infusions:  . sodium chloride 10 mL/hr at 09/07/12 0152  . heparin 1,400 Units/hr (09/07/12 0300)   Assessment: 62 y/o M on D#1 vancomycin 2500 mg x1 followed by 2000 mg q24h + Zosyn 3.375 grams IV q8h (extended-infusion) for chronically infected pilonidal cyst.  SCr improving; estimated CrCl now > 60 mL/min/72kg   Goal of Therapy:  Vancomycin trough level 15-20 mcg/ml  Plan:  1. Increase vancomycin to 1500 mg IV q12h, next dose at 8pm. 2. Continue Zosyn 3.375 grams IV q8h (extended-infusion). 3. Follow  serum creatinine. 4. Check vancomycin trough at steady-state.  Elie Goody, PharmD, BCPS Pager: [phone] 09/07/2012  9:25 AM

## 2012-09-07 NOTE — Progress Notes (Signed)
Subjective: Sitting up in bed, no complaints currently.  He has lost his [facility] care doctor, glucose ranges between 120 - 400 at home.  Problem has been present for 6 weeks.    Objective: Vital signs in last 24 hours: Temp:  [98 F (36.7 C)-98.7 F (37.1 C)] 98.7 F (37.1 C) (02/27 0500) Pulse Rate:  [73-109] 73 (02/27 0500) Resp:  [13-23] 19 (02/27 0500) BP: (130-140)/(61-75) 131/63 mmHg (02/27 0500) SpO2:  [97 %-100 %] 99 % (02/27 0500) Weight:  [377 lb 3.3 oz (171.1 kg)] 377 lb 3.3 oz (171.1 kg) (02/27 0142) Last BM Date: 09/06/12 Diet: CARB modified, afebrile, VSS, WBC about the same, Glucose 409, and 196 so far on this admit. Intake/Output from previous day: 02/26 0701 - 02/27 0700 In: 1090.3 [I.V.:1040.3; IV Piggyback:50] Out: -  Intake/Output this shift:    General appearance: alert, cooperative and no distress Skin: skin is dry, some fecal material at the site, dry, I don't see much erythema, or induration right now, some chronic skin changes and thickening.  Lab Results:   Recent Labs  09/06/12 2000 09/07/12 0522  WBC 14.0* 13.6*  HGB 12.0* 11.6*  HCT 35.9* 34.1*  PLT 268 271    BMET  Recent Labs  09/06/12 2000 09/07/12 0522  NA 132* 135  K 4.4 3.8  CL 94* 98  CO2 24 25  GLUCOSE 409* 196*  BUN 24* 20  CREATININE 1.50* 1.22  CALCIUM 9.0 8.7   PT/INR  Recent Labs  09/06/12 2000 09/07/12 0522  LABPROT 17.8* 19.0*  INR 1.51* 1.65*    No results found for this basename: AST, ALT, ALKPHOS, BILITOT, PROT, ALBUMIN,  in the last 168 hours   Lipase  No results found for this basename: lipase     Studies/Results: Ct Pelvis W Contrast  09/06/2012  *RADIOLOGY REPORT*  Clinical Data:  The patient had a perianal abscess 3 months ago that was treated.  There was initial improvement but now has worsened.  CT PELVIS WITH CONTRAST  Technique:  Multidetector CT imaging of the pelvis was performed using the standard protocol following the bolus  administration of intravenous contrast.  Contrast: OMNIPAQUE IOHEXOL 300 MG/ML  SOLN  Comparison:   None.  Findings:  CT examination of the pelvis is obtained with IV and rectal contrast material. Technically limited study due to the patient's body habitus. The There is infiltration in the perineal/perianal soft tissues to the left of midline extending from the region of the anus inferiorly.  This measures maximally about 16 x 66 mm.  There is associated skin thickening.  This could represent cellulitis or fistula.  No discrete tract is identified. There is no evidence of fluid collection to suggest abscess.  No soft tissue gas collections.  No contrast extravasation is noted.  Pelvic organs appear intact.  Bladder wall is not thickened. Prostate gland is not enlarged.  No free or loculated pelvic fluid collections.  Diverticula in the sigmoid colon without diverticulitis.  The appendix is not identified.  IMPRESSION: Soft tissue infiltration in the left peri anal and inferior perineal fat with skin thickening suggesting cellulitis.  No fluid collection to suggest abscess.   Original Report Authenticated By: Burman Nieves, M.D.     Medications: . atorvastatin  20 mg Oral q1800  . gabapentin  300 mg Oral BID  . lisinopril  20 mg Oral Daily   And  . hydrochlorothiazide  12.5 mg Oral Daily  . insulin aspart  0-20 Units Subcutaneous  TID WC  . insulin glargine  70 Units Subcutaneous QHS  . metaxalone  800 mg Oral Daily  . multivitamin with minerals  1 tablet Oral Daily  . piperacillin-tazobactam (ZOSYN)  IV  3.375 g Intravenous Q8H  . pneumococcal 23 valent vaccine  0.5 mL Intramuscular Tomorrow-1000  . sodium chloride  3 mL Intravenous Q12H  . vancomycin  1,500 mg Intravenous Once  . vancomycin  2,000 mg Intravenous Q24H    Assessment/Plan Pilonidal cyst with chronic infection Diabetes mellitus, with poor control, (HBA1C 8.0 last year) Hypertension  CHF (congestive heart failure) History  of MI (myocardial infarction Atrial fibrillation on chronic coumadin Sleep apnea, NO CPAP Rheumatoid arthritis  Body mass index is 51.15   PLan:  Add Sitz baths, and keep area clean. Continue antibiotics.     LOS: 1 day    Steffen Hase 09/07/2012

## 2012-09-07 NOTE — Progress Notes (Addendum)
ANTICOAGULATION CONSULT NOTE   Pharmacy Consult for Heparin/Warfarin Indication: atrial fibrillation  No Known Allergies  Patient Measurements: Height: 6' (182.9 cm) Weight: 377 lb 3.3 oz (171.1 kg) IBW/kg (Calculated) : 77.6 Heparin Dosing Weight: 105 kg  Vital Signs: Temp: 98 F (36.7 C) (02/27 1317) Temp src: Oral (02/27 1317) BP: 140/49 mmHg (02/27 1317) Pulse Rate: 84 (02/27 1317)  Labs:  Recent Labs  09/06/12 2000 09/07/12 0522 09/07/12 0903 09/07/12 1659  HGB 12.0* 11.6*  --   --   HCT 35.9* 34.1*  --   --   PLT 268 271  --   --   APTT 44*  --   --   --   LABPROT 17.8* 19.0*  --   --   INR 1.51* 1.65*  --   --   HEPARINUNFRC  --   --  <0.10* 0.16*  CREATININE 1.50* 1.22  --   --     Estimated Creatinine Clearance: 104.7 ml/min (by C-G formula based on Cr of 1.22).   Medical History: Past Medical History  Diagnosis Date  . Diabetes mellitus   . Hypertension   . Rheumatoid arthritis   . Neuropathy   . CHF (congestive heart failure)   . History of MI (myocardial infarction)     SILENT MI IN PAST   . CAD (coronary artery disease)   . Atrial fibrillation   . Anemia   . Sleep apnea      SEVERE, NO CPAP USED, DOES NOT KNOW  WHERE SLEEP STUDY DONE    Medications:  Scheduled:  . [COMPLETED] sodium chloride   Intravenous Once  . atorvastatin  20 mg Oral q1800  . gabapentin  300 mg Oral BID  . [COMPLETED] heparin  2,000 Units Intravenous Once  . [COMPLETED] heparin  2,000 Units Intravenous Once  . lisinopril  20 mg Oral Daily   And  . hydrochlorothiazide  12.5 mg Oral Daily  . [COMPLETED]  HYDROmorphone (DILAUDID) injection  1 mg Intravenous Once  . [COMPLETED]  HYDROmorphone (DILAUDID) injection  1 mg Intravenous Once  . insulin aspart  0-20 Units Subcutaneous TID WC  . insulin aspart  0-5 Units Subcutaneous QHS  . [COMPLETED] insulin aspart  10 Units Subcutaneous Once  . insulin aspart  4 Units Subcutaneous TID WC  . [COMPLETED] insulin glargine   70 Units Subcutaneous Once  . insulin glargine  70 Units Subcutaneous QHS  . metaxalone  800 mg Oral Daily  . multivitamin with minerals  1 tablet Oral Daily  . piperacillin-tazobactam (ZOSYN)  IV  3.375 g Intravenous Q8H  . [COMPLETED] pneumococcal 23 valent vaccine  0.5 mL Intramuscular Tomorrow-1000  . [COMPLETED] sodium chloride  1,000 mL Intravenous Once  . sodium chloride  3 mL Intravenous Q12H  . [COMPLETED] vancomycin  1,500 mg Intravenous Once  . vancomycin  1,500 mg Intravenous Q12H  . [COMPLETED] vancomycin  1,000 mg Intravenous Once  . [COMPLETED] warfarin  7.5 mg Oral ONCE-1800  . Warfarin - Pharmacist Dosing Inpatient   Does not apply q1800  . [COMPLETED] sodium chloride   Intravenous STAT  . [DISCONTINUED] insulin regular  10 Units Intravenous Once  . [DISCONTINUED] insulin regular  10 Units Subcutaneous Once  . [DISCONTINUED] lisinopril-hydrochlorothiazide  1 tablet Oral Daily  . [DISCONTINUED] piperacillin-tazobactam (ZOSYN)  IV  2.25 g Intravenous Q6H  . [DISCONTINUED] pneumococcal 13-valent conjugate vaccine  0.5 mL Intramuscular Tomorrow-1000  . [DISCONTINUED] vancomycin  2,000 mg Intravenous Q24H   Infusions:  . sodium  chloride 10 mL/hr at 09/07/12 0152  . heparin 1,950 Units/hr (09/07/12 1708)  . [DISCONTINUED] heparin 1,400 Units/hr (09/07/12 0300)    Assessment:  62 yo M on chronic warfarin for atrial fibrillation, admitted with infected pilonidal cyst.  Warfarin was held in anticipation of possible need for I&D, per surgery will not need I&D.  Per MD orders continue IV heparin for bridge and resume warfarin.    INR this morning (1.65) Below goal of 2-3  Warf PTA dosage: 7.5mg  Th, Sat; 5mg  other days--Last dose 2/25  Heparin level this PM remains low (0.16) after a 2000 units rebolus and rate increase to 1950 units/hr.   Per RN, heparin not running x 1 hour prior to level being drawn.  Will re-bolus with half bolus and resume gtt rate at 1950/hr and repeat  level.   Goal of Therapy:  Heparin level 0.3-0.7 units/ml Monitor platelets by anticoagulation protocol: Yes   Plan:  1. Heparin 1000 unit bolus IV x1. 2. Resume heparin infusion to 1950 units/hr. 3. Recheck heparin level at 0030 4. Warfarin 7.5 mg po x 1 tonight at 1800  Roselia Snipe, Loma Messing PharmD Pager #: [phone] 6:20 PM 09/07/2012

## 2012-09-07 NOTE — Progress Notes (Signed)
Agree with A&P of WJ,PA. Looks like a chronic pilonidal and did not see anything that would be amenable to I&D

## 2012-09-08 LAB — BASIC METABOLIC PANEL
Chloride: 95 mEq/L — ABNORMAL LOW (ref 96–112)
GFR calc Af Amer: 62 mL/min — ABNORMAL LOW (ref >90)
GFR calc non Af Amer: 54 mL/min — ABNORMAL LOW (ref >90)
Potassium: 3.8 mEq/L (ref 3.5–5.1)
Sodium: 131 mEq/L — ABNORMAL LOW (ref 135–145)

## 2012-09-08 LAB — HEPARIN LEVEL (UNFRACTIONATED): Heparin Unfractionated: 0.37 IU/mL (ref 0.30–0.70)

## 2012-09-08 LAB — CBC
MCHC: 32.8 g/dL (ref 30.0–36.0)
Platelets: 232 10*3/uL (ref 150–400)
RDW: 13.6 % (ref 11.5–15.5)
WBC: 11.1 10*3/uL — ABNORMAL HIGH (ref 4.0–10.5)

## 2012-09-08 LAB — GLUCOSE, CAPILLARY
Glucose-Capillary: 276 mg/dL — ABNORMAL HIGH (ref 70–99)
Glucose-Capillary: 281 mg/dL — ABNORMAL HIGH (ref 70–99)

## 2012-09-08 LAB — PROTIME-INR
INR: 1.7 — ABNORMAL HIGH (ref 0.00–1.49)
Prothrombin Time: 19.4 seconds — ABNORMAL HIGH (ref 11.6–15.2)

## 2012-09-08 LAB — HEMOGLOBIN A1C: Hgb A1c MFr Bld: 10.1 % — ABNORMAL HIGH (ref <5.7)

## 2012-09-08 MED ORDER — HEPARIN (PORCINE) IN NACL 100-0.45 UNIT/ML-% IJ SOLN
2200.0000 [IU]/h | INTRAMUSCULAR | Status: DC
  Administered 2012-09-08: 2200 [IU]/h via INTRAVENOUS
  Filled 2012-09-08 (×2): qty 250

## 2012-09-08 MED ORDER — LIVING WELL WITH DIABETES BOOK
Freq: Once | Status: AC
  Administered 2012-09-08: 17:00:00
  Filled 2012-09-08: qty 1

## 2012-09-08 MED ORDER — MAGNESIUM CITRATE PO SOLN: 1.0000 | Freq: Once | ORAL | Status: AC | PRN

## 2012-09-08 MED ORDER — WARFARIN SODIUM 5 MG PO TABS
5.0000 mg | ORAL_TABLET | Freq: Once | ORAL | Status: DC
  Filled 2012-09-08: qty 1

## 2012-09-08 MED ORDER — GABAPENTIN 300 MG PO CAPS
600.0000 mg | ORAL_CAPSULE | Freq: Two times a day (BID) | ORAL | Status: DC
  Administered 2012-09-08 – 2012-09-10 (×4): 600 mg via ORAL
  Filled 2012-09-08 (×5): qty 2

## 2012-09-08 MED ORDER — INSULIN ASPART 100 UNIT/ML ~~LOC~~ SOLN
10.0000 [IU] | Freq: Three times a day (TID) | SUBCUTANEOUS | Status: DC
  Administered 2012-09-08 – 2012-09-09 (×4): 10 [IU] via SUBCUTANEOUS

## 2012-09-08 MED ORDER — INSULIN GLARGINE 100 UNIT/ML ~~LOC~~ SOLN: 20.0000 [IU] | Freq: Every day | SUBCUTANEOUS | Status: DC

## 2012-09-08 MED ORDER — SENNA 8.6 MG PO TABS: 2.0000 | ORAL_TABLET | Freq: Every evening | ORAL | Status: DC | PRN

## 2012-09-08 MED ORDER — DOCUSATE SODIUM 100 MG PO CAPS
100.0000 mg | ORAL_CAPSULE | Freq: Two times a day (BID) | ORAL | Status: DC
  Administered 2012-09-09 – 2012-09-10 (×3): 100 mg via ORAL
  Filled 2012-09-08 (×5): qty 1

## 2012-09-08 MED ORDER — HEPARIN (PORCINE) IN NACL 100-0.45 UNIT/ML-% IJ SOLN
2300.0000 [IU]/h | INTRAMUSCULAR | Status: DC
  Administered 2012-09-08 – 2012-09-10 (×6): 2300 [IU]/h via INTRAVENOUS
  Filled 2012-09-08 (×6): qty 250

## 2012-09-08 MED ORDER — WARFARIN SODIUM 7.5 MG PO TABS
7.5000 mg | ORAL_TABLET | Freq: Once | ORAL | Status: AC
  Administered 2012-09-08: 7.5 mg via ORAL
  Filled 2012-09-08: qty 1

## 2012-09-08 MED ORDER — OXYCODONE HCL 5 MG PO TABS
30.0000 mg | ORAL_TABLET | Freq: Four times a day (QID) | ORAL | Status: DC
  Administered 2012-09-08 – 2012-09-10 (×8): 30 mg via ORAL
  Filled 2012-09-08 (×8): qty 6
  Filled 2012-09-08 (×2): qty 1

## 2012-09-08 NOTE — Progress Notes (Signed)
Subjective: Complaining of his neck and back pain chronic, also complaining of his buttocks some.  Objective: Vital signs in last 24 hours: Temp:  [98 F (36.7 C)-98.3 F (36.8 C)] 98.3 F (36.8 C) (02/28 0618) Pulse Rate:  [72-84] 72 (02/28 0618) Resp:  [18] 18 (02/28 0618) BP: (118-140)/(46-62) 118/46 mmHg (02/28 0618) SpO2:  [96 %-98 %] 98 % (02/28 0618) Weight:  [381 lb 6.3 oz (173 kg)] 381 lb 6.3 oz (173 kg) (02/28 0618) Last BM Date: 09/07/12  Intake/Output from previous day: 02/27 0701 - 02/28 0700 In: 1160.7 [P.O.:480; I.V.:30.7; IV Piggyback:650] Out: 400 [Urine:400] Intake/Output this shift: Total I/O In: 240 [P.O.:240] Out: -   General appearance: alert, cooperative and no distress Skin: Pilonidal area looks open and chronic.  Nothing at this point to require further I&D.   Lab Results:   Recent Labs  09/07/12 0522 09/08/12 0527  WBC 13.6* 11.1*  HGB 11.6* 10.5*  HCT 34.1* 32.0*  PLT 271 232    BMET  Recent Labs  09/07/12 0522 09/08/12 0527  NA 135 131*  K 3.8 3.8  CL 98 95*  CO2 25 26  GLUCOSE 196* 222*  BUN 20 17  CREATININE 1.22 1.39*  CALCIUM 8.7 8.4   PT/INR  Recent Labs  09/07/12 0522 09/08/12 0527  LABPROT 19.0* 19.4*  INR 1.65* 1.70*    No results found for this basename: AST, ALT, ALKPHOS, BILITOT, PROT, ALBUMIN,  in the last 168 hours   Lipase  No results found for this basename: lipase     Studies/Results: Ct Pelvis W Contrast  09/06/2012  *RADIOLOGY REPORT*  Clinical Data:  The patient had a perianal abscess 3 months ago that was treated.  There was initial improvement but now has worsened.  CT PELVIS WITH CONTRAST  Technique:  Multidetector CT imaging of the pelvis was performed using the standard protocol following the bolus administration of intravenous contrast.  Contrast: OMNIPAQUE IOHEXOL 300 MG/ML  SOLN  Comparison:   None.  Findings:  CT examination of the pelvis is obtained with IV and rectal contrast  material. Technically limited study due to the patient's body habitus. The There is infiltration in the perineal/perianal soft tissues to the left of midline extending from the region of the anus inferiorly.  This measures maximally about 16 x 66 mm.  There is associated skin thickening.  This could represent cellulitis or fistula.  No discrete tract is identified. There is no evidence of fluid collection to suggest abscess.  No soft tissue gas collections.  No contrast extravasation is noted.  Pelvic organs appear intact.  Bladder wall is not thickened. Prostate gland is not enlarged.  No free or loculated pelvic fluid collections.  Diverticula in the sigmoid colon without diverticulitis.  The appendix is not identified.  IMPRESSION: Soft tissue infiltration in the left peri anal and inferior perineal fat with skin thickening suggesting cellulitis.  No fluid collection to suggest abscess.   Original Report Authenticated By: Burman Nieves, M.D.     Medications: . atorvastatin  20 mg Oral q1800  . gabapentin  300 mg Oral BID  . lisinopril  20 mg Oral Daily   And  . hydrochlorothiazide  12.5 mg Oral Daily  . insulin aspart  0-20 Units Subcutaneous TID WC  . insulin aspart  0-5 Units Subcutaneous QHS  . insulin aspart  4 Units Subcutaneous TID WC  . insulin glargine  70 Units Subcutaneous QHS  . metaxalone  800 mg Oral Daily  .  multivitamin with minerals  1 tablet Oral Daily  . piperacillin-tazobactam (ZOSYN)  IV  3.375 g Intravenous Q8H  . sodium chloride  3 mL Intravenous Q12H  . vancomycin  1,500 mg Intravenous Q12H  . warfarin  5 mg Oral ONCE-1800  . Warfarin - Pharmacist Dosing Inpatient   Does not apply q1800    Assessment/Plan Pilonidal cyst with chronic infection  Diabetes mellitus, with poor control, (HBA1C 8.0 last year)  Hypertension  CHF (congestive heart failure)  History of MI (myocardial infarction  Atrial fibrillation on chronic coumadin  Sleep apnea, NO CPAP  Rheumatoid  arthritis  Body mass index is 51.15 Chronic back and neck pain.  Plan:  Continue conservative treatment, for now.  Dr.Shaquira Moroz has reviewed CT with radiology.  No new recommendations.         LOS: 2 days    JENNINGS,WILLARD 09/08/2012

## 2012-09-08 NOTE — Progress Notes (Addendum)
[name] PROGRESS NOTE  AMAHD MORINO JYN:[id number] DOB: 1950-08-28 DOA: 09/06/2012 PCP: Jeri Cos, MD  Assessment/Plan  Pilonidal cyst with infection and surrounding cellulitis, worsening pain despite IV abx and nothing to drain per exam and CT pelvis -  Continue vancomycin and zosyn -  Appreciate surgery assistance  -  Schedule oxycodone for pain  -  Increase gabapentin to 600mg  BID -  Continue dilaudid only for breakthrough pain -  Patient given information about PCP and pain clinics in the area  Uncontrolled T2DM, likely due to insulin resistance in the setting of infection.  A1c 10.1 -  Continue lantus 70 units qhs and add 20 units qAM (first dose tomorrow) -  Increase to aspart 10 units AC -  Continue high dose SSI -  Add HS insulin  Atrial fibrillation, currently in atrial fibrillation on telemetry with rate controlled. -  D/c telemetry -  Coumadin dosing per pharmacy with bridging heparin gtt  (per surgery, nothing to drain so procedure is less likely) -  Not on rate or rhythm control medications.  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 [name] notes:  Will discuss with patient. -  Restart lasix/spironolactone prn  Diabetic neuropathy, stable.  Increase gabapentin  Leukocytosis, trending down on antibiotics, likely related to cellulitis  Normocytic anemia, stable.  Tx for hgb < 7 -  Defer further evaluation to PCP if not already complete CKD stage 3, stable.    Constipation, likely related to narcotic use -  Add colace BID and senna qhs -  Magnesium citrate prn  Diet:  diabetic Access:  piv IVF:  kvo Proph:  Heparin gtt and warfarin  Code Status: full code Family Communication: spoke with patient alone Disposition Plan:  pending further improvement in cellulitis   Consultants:  General surgery  Procedures:  CT pelvis   Antibiotics:  vanc 2/27 >>  Zosyn 2/27 >>   HPI/Subjective:  Denies fevers, chills, headache.  Denies chest pain and shortness of breath.  Denies nausea, vomiting, diarrhea.  Had BM yesterday morning, none since.  Has chronic constipation due to narcotic use.  Pain is worse today in back (chronic) and in buttock area despite antibiotics and pain medication, however, he has been receiving less frequent IV dilaudid  Objective: Filed Vitals:   09/07/12 1317 09/07/12 2050 09/08/12 0618 09/08/12 1411  BP: 140/49 139/62 118/46 127/51  Pulse: 84 82 72 88  Temp: 98 F (36.7 C) 98 F (36.7 C) 98.3 F (36.8 C) 97.3 F (36.3 C)  TempSrc: Oral Oral Oral Oral  Resp: 18 18 18 18   Height:      Weight:   173 kg (381 lb 6.3 oz)   SpO2: 97% 96% 98% 100%    Intake/Output Summary (Last 24 hours) at 09/08/12 1609 Last data filed at 09/08/12 1158  Gross per 24 hour  Intake 1400.67 ml  Output    400 ml  Net 1000.67 ml   Filed Weights   09/07/12 0142 09/08/12 0618  Weight: 171.1 kg (377 lb 3.3 oz) 173 kg (381 lb 6.3 oz)    Exam:   General:  Obese CM, No acute distress  HEENT:  NCAT, MMM  Cardiovascular:  IRRR, nl S1, S2 no mrg, 2+ pulses, warm extremities  Respiratory:   CTAB, no increased WOB  Abdomen:   NABS, soft, NT/ND  MSK:   Normal tone and bulk, 1+ LEE  Neuro:  Grossly moves all extremities and ambulates during exam  Skin:  Upper buttock crease with draining 5mm opening with foul odor and yellow drainage.  Mild surrounding induration versus skin thickening and 10 cm radius of minimal erythema.     Data Reviewed: Basic Metabolic Panel:  Recent Labs Lab 09/06/12 2000 09/07/12 0522 09/08/12 0527  NA 132* 135 131*  K 4.4 3.8 3.8  CL 94* 98 95*  CO2 24 25 26   GLUCOSE 409* 196* 222*  BUN 24* 20 17  CREATININE 1.50* 1.22 1.39*  CALCIUM 9.0 8.7 8.4   Liver  Function Tests: No results found for this basename: AST, ALT, ALKPHOS, BILITOT, PROT, ALBUMIN,  in the last 168 hours No results found for this basename: LIPASE, AMYLASE,  in the last 168 hours No results found for this basename: AMMONIA,  in the last 168 hours CBC:  Recent Labs Lab 09/06/12 2000 09/07/12 0522 09/08/12 0527  WBC 14.0* 13.6* 11.1*  NEUTROABS 9.4*  --   --   HGB 12.0* 11.6* 10.5*  HCT 35.9* 34.1* 32.0*  MCV 80.5 80.4 80.4  PLT 268 271 232   Cardiac Enzymes: No results found for this basename: CKTOTAL, CKMB, CKMBINDEX, TROPONINI,  in the last 168 hours BNP (last 3 results) No results found for this basename: PROBNP,  in the last 8760 hours CBG:  Recent Labs Lab 09/07/12 1130 09/07/12 1650 09/07/12 2105 09/08/12 0729 09/08/12 1144  GLUCAP 245* 265* 314* 207* 311*    Recent Results (from the past 240 hour(s))  CULTURE, BLOOD (ROUTINE X 2)     Status: None   Collection Time    09/06/12 11:45 PM      Result Value Range Status   Specimen Description BLOOD LEFT HAND   Final   Special Requests BOTTLES DRAWN AEROBIC AND ANAEROBIC 5CC EACH   Final   Culture  Setup Time 09/07/2012 08:36   Final   Culture     Final   Value:        BLOOD CULTURE RECEIVED NO GROWTH TO DATE CULTURE WILL BE HELD FOR 5 DAYS BEFORE ISSUING A FINAL NEGATIVE REPORT   Report Status PENDING   Incomplete  CULTURE, BLOOD (ROUTINE X 2)     Status: None   Collection Time    09/06/12 11:50 PM      Result Value Range Status   Specimen Description BLOOD RIGHT HAND   Final   Special Requests BOTTLES DRAWN AEROBIC AND ANAEROBIC 4  CC EACH   Final   Culture  Setup Time 09/07/2012 08:35   Final   Culture     Final   Value:        BLOOD CULTURE RECEIVED NO GROWTH TO DATE CULTURE WILL BE HELD FOR 5 DAYS BEFORE ISSUING A FINAL NEGATIVE REPORT   Report Status PENDING   Incomplete     Studies: Ct Pelvis W Contrast  09/06/2012  *RADIOLOGY REPORT*  Clinical Data:  The patient had a perianal abscess  3 months ago that was treated.  There was initial improvement but now has worsened.  CT PELVIS WITH CONTRAST  Technique:  Multidetector CT imaging of the pelvis was performed using the standard protocol following the bolus administration of intravenous contrast.  Contrast: OMNIPAQUE IOHEXOL 300 MG/ML  SOLN  Comparison:   None.  Findings:  CT  examination of the pelvis is obtained with IV and rectal contrast material. Technically limited study due to the patient's body habitus. The There is infiltration in the perineal/perianal soft tissues to the left of midline extending from the region of the anus inferiorly.  This measures maximally about 16 x 66 mm.  There is associated skin thickening.  This could represent cellulitis or fistula.  No discrete tract is identified. There is no evidence of fluid collection to suggest abscess.  No soft tissue gas collections.  No contrast extravasation is noted.  Pelvic organs appear intact.  Bladder wall is not thickened. Prostate gland is not enlarged.  No free or loculated pelvic fluid collections.  Diverticula in the sigmoid colon without diverticulitis.  The appendix is not identified.  IMPRESSION: Soft tissue infiltration in the left peri anal and inferior perineal fat with skin thickening suggesting cellulitis.  No fluid collection to suggest abscess.   Original Report Authenticated By: Burman Nieves, M.D.     Scheduled Meds: . atorvastatin  20 mg Oral q1800  . gabapentin  300 mg Oral BID  . lisinopril  20 mg Oral Daily   And  . hydrochlorothiazide  12.5 mg Oral Daily  . insulin aspart  0-20 Units Subcutaneous TID WC  . insulin aspart  0-5 Units Subcutaneous QHS  . insulin aspart  4 Units Subcutaneous TID WC  . insulin glargine  70 Units Subcutaneous QHS  . metaxalone  800 mg Oral Daily  . multivitamin with minerals  1 tablet Oral Daily  . oxycodone  30 mg Oral Q6H  . piperacillin-tazobactam (ZOSYN)  IV  3.375 g Intravenous Q8H  . sodium chloride  3  mL Intravenous Q12H  . vancomycin  1,500 mg Intravenous Q12H  . warfarin  7.5 mg Oral ONCE-1800  . Warfarin - Pharmacist Dosing Inpatient   Does not apply q1800   Continuous Infusions: . sodium chloride 10 mL/hr at 09/07/12 0152  . heparin 2,300 Units/hr (09/08/12 1554)    Principal Problem:   Perirectal cellulitis Active Problems:   DIABETES MELLITUS-TYPE II   ANEMIA   Atrial fibrillation   UNSPEC COMBINED SYSTOLIC&DIASTOLIC HEART FAILURE   CAD (coronary artery disease)    Time spent: 30 min    Iram Astorino, [facility]  [name] Pager [phone]. If 7PM-7AM, please contact night-coverage at [email or web address], password [facility] 09/08/2012, 4:09 PM  LOS: 2 days

## 2012-09-08 NOTE — Clinical Documentation Improvement (Signed)
Abnormal Labs Clarification  THIS DOCUMENT IS NOT A PERMANENT PART OF THE MEDICAL RECORD  TO RESPOND TO THE THIS QUERY, FOLLOW THE INSTRUCTIONS BELOW:  1. If needed, update documentation for the patient's encounter via the notes activity.  2. Access this query again and click edit on the [employer].  3. After updating, or not, click F2 to complete all highlighted (required) fields concerning your review. Select "additional documentation in the medical record" OR "no additional documentation provided".  4. Click Sign note button.  5. The deficiency will fall out of your InBasket *Please let us know if you are not able to complete this workflow by phone or e-mail (listed below).  Please update your documentation within the medical record to reflect your response to this query.                                                                                   09/08/12  Dear Dr. Sherrie George, M/Associates  In a better effort to capture your patient's severity of illness, reflect appropriate length of stay and utilization of resources, a review of the medical record has revealed the following indicators.    Based on your clinical judgment, please clarify and document in a progress note and/or discharge summary the clinical condition associated with the following supporting information:  In responding to this query please exercise your independent judgment.  The fact that a query is asked, does not imply that any particular answer is desired or expected.  Abnormal findings (laboratory, x-ray, pathologic, and other diagnostic results) are not coded and reported unless the physician indicates their clinical significance.   The medical record reflects the following clinical findings, please clarify the diagnostic and/or clinical significance:      In this pt. admitted with Abscess of anal and rectal regions a review of the medical record reveals the following:   Abnormal Sodium  levels=132 and 131  Treatment: NS IVF  Clarification Needed     Please clarify the underlying diagnosis responsible for the abnormal labs and document in the pn or d/c summary.      Possible Clinical Conditions?                                 ____________________                                         Other Condition___________________                Cannot Clinically Determine_________   Supporting Information: Abscess of anal and rectal regions, AFIB, DM, CAD, Pilonidal cyst , H/O CHF.   Diagnostics  Component      Sodium  Latest Ref Rng      135 - 145 mEq/L  09/06/2012     8:00 PM 132 (L)   Component      Sodium  Latest Ref Rng      135 - 145 mEq/L  09/08/2012     5:27 AM 131 (L)  Treatment 0.9 %  sodium chloride infusion   [[id number]]    Reviewed:  no additional documentation provided   Thank You,  Enis Slipper  RN, BSN, MSN/Inf, CCDS Clinical Documentation Specialist [facility] Dept Pager: [phone] / E-mail: [email or web address]  Health Information Management [place] Send this stuff to primary.  We were ask about rectal fistula. wj

## 2012-09-08 NOTE — Progress Notes (Addendum)
PHARMACY BRIEF NOTE - Drug Level Result  Consult for:  IV heparin Indication:  Atrial fibrillation, bridging to warfarin  With the infusion of 2300 units/hr, the heparin level drawn 6 hours after the last rate change is reported as 0.37 units/ml.  This level is within the therapeutic range, 0.3-0.7 units/ml.  No problems with the infusion or with bleeding have been reported.  Plan:  Continue the heparin drip at the current rate.  Check the next level with morning labs on 09/09/12  [facility] R.Ph. 09/08/2012 3:56 PM

## 2012-09-08 NOTE — Clinical Documentation Improvement (Signed)
BMI DOCUMENTATION CLARIFICATION QUERY  THIS DOCUMENT IS NOT A PERMANENT PART OF THE MEDICAL RECORD  TO RESPOND TO THE THIS QUERY, FOLLOW THE INSTRUCTIONS BELOW:  1. If needed, update documentation for the patient's encounter via the notes activity.  2. Access this query again and click edit on the In [employer].  3. After updating, or not, click F2 to complete all highlighted (required) fields concerning your review. Select "additional documentation in the medical record" OR "no additional documentation provided".  4. Click Sign note button.  5. The deficiency will fall out of your In Basket *Please let us know if you are not able to complete this workflow by phone or e-mail (listed below).         09/08/12  Dear Dr. Sherrie George Marton Redwood  In an effort to better capture your patient's severity of illness, reflect appropriate length of stay and utilization of resources, a review of the patient medical record has revealed the following indicators.    Based on your clinical judgment, please clarify and document in a progress note and/or discharge summary the clinical condition associated with the following supporting information:  In responding to this query please exercise your independent judgment.  The fact that a query is asked, does not imply that any particular answer is desired or expected.   Pt's BMI= 51.15 per pn 09/08/12                      .  Clarification Needed    Please clarify whether or not BMI can be linked to one of the diagnoses listed below and document in pn  and d/c. Thank You!  [facility]ACT[facility]: When linking BMI to a diagnosis please document both BMI and diagnosis together in pn for accuracy of severity of illness (SOI) and risk of mortality (ROM).    Possible Clinical conditions  Morbid Obesity W/ BMI=   Underweight w/BMI=  Other condition___________________  Cannot Clinically determine _____________  Risk Factors: Abscess of anal and  rectal regions, AFIB, DM, CAD, Pilonidal cyst , H/O CHF  Sign & Symptoms: Weight: 381 lbs Height 6" BMI= listed above    Treatment Nutrition Note: Monitoring  Reviewed:  no additional documentation provided  Thank You,  Enis Slipper  RN, BSN, MSN/Inf, CCDS Clinical Documentation Specialist [facility] Dept Pager: [phone] / E-mail: [email or web address]  Health Information Management   I have no idea what you want, his BMI is relevant to all his co morbidities.  If you want something more ask the attending.PS  i don't know what this F2 reviewed thing means either.

## 2012-09-08 NOTE — ED Provider Notes (Signed)
Medical screening examination/treatment/procedure(s) were performed by non-physician practitioner and as supervising physician I was immediately available for consultation/collaboration.   Loren Racer, MD 09/08/12 [phone]

## 2012-09-08 NOTE — Progress Notes (Signed)
ANTICOAGULATION CONSULT NOTE   Pharmacy Consult for Heparin/Warfarin Indication: atrial fibrillation  No Known Allergies  Patient Measurements: Height: 6' (182.9 cm) Weight: 377 lb 3.3 oz (171.1 kg) IBW/kg (Calculated) : 77.6 Heparin Dosing Weight: 105 kg  Vital Signs: Temp: 98 F (36.7 C) (02/27 2050) Temp src: Oral (02/27 2050) BP: 139/62 mmHg (02/27 2050) Pulse Rate: 82 (02/27 2050)  Labs:  Recent Labs  09/06/12 2000 09/07/12 0522 09/07/12 0903 09/07/12 1659 09/08/12 0045  HGB 12.0* 11.6*  --   --   --   HCT 35.9* 34.1*  --   --   --   PLT 268 271  --   --   --   APTT 44*  --   --   --   --   LABPROT 17.8* 19.0*  --   --   --   INR 1.51* 1.65*  --   --   --   HEPARINUNFRC  --   --  <0.10* 0.16* 0.20*  CREATININE 1.50* 1.22  --   --   --     Estimated Creatinine Clearance: 104.7 ml/min (by C-G formula based on Cr of 1.22).   Medical History: Past Medical History  Diagnosis Date  . Diabetes mellitus   . Hypertension   . Rheumatoid arthritis   . Neuropathy   . CHF (congestive heart failure)   . History of MI (myocardial infarction)     SILENT MI IN PAST   . CAD (coronary artery disease)   . Atrial fibrillation   . Anemia   . Sleep apnea      SEVERE, NO CPAP USED, DOES NOT KNOW  WHERE SLEEP STUDY DONE    Medications:  Scheduled:  . atorvastatin  20 mg Oral q1800  . gabapentin  300 mg Oral BID  . [COMPLETED] heparin  1,000 Units Intravenous Once  . [COMPLETED] heparin  2,000 Units Intravenous Once  . [COMPLETED] heparin  2,000 Units Intravenous Once  . lisinopril  20 mg Oral Daily   And  . hydrochlorothiazide  12.5 mg Oral Daily  . insulin aspart  0-20 Units Subcutaneous TID WC  . insulin aspart  0-5 Units Subcutaneous QHS  . insulin aspart  4 Units Subcutaneous TID WC  . insulin glargine  70 Units Subcutaneous QHS  . metaxalone  800 mg Oral Daily  . multivitamin with minerals  1 tablet Oral Daily  . piperacillin-tazobactam (ZOSYN)  IV  3.375 g  Intravenous Q8H  . [COMPLETED] pneumococcal 23 valent vaccine  0.5 mL Intramuscular Tomorrow-1000  . sodium chloride  3 mL Intravenous Q12H  . [COMPLETED] vancomycin  1,500 mg Intravenous Once  . vancomycin  1,500 mg Intravenous Q12H  . [COMPLETED] warfarin  7.5 mg Oral ONCE-1800  . Warfarin - Pharmacist Dosing Inpatient   Does not apply q1800  . [DISCONTINUED] pneumococcal 13-valent conjugate vaccine  0.5 mL Intramuscular Tomorrow-1000  . [DISCONTINUED] vancomycin  2,000 mg Intravenous Q24H   Infusions:  . sodium chloride 10 mL/hr at 09/07/12 0152  . heparin 2,200 Units/hr (09/08/12 0159)  . [DISCONTINUED] heparin 1,400 Units/hr (09/07/12 0300)  . [DISCONTINUED] heparin 1,950 Units/hr (09/07/12 1708)    Assessment:  62 yo M on chronic warfarin for atrial fibrillation, admitted with infected pilonidal cyst.  Warfarin was held in anticipation of possible need for I&D, per surgery will not need I&D.  Per MD orders continue IV heparin for bridge and resume warfarin.    INR this morning (1.65) Below goal of 2-3  Warf PTA dosage: 7.5mg  Th, Sat; 5mg  other days--Last dose 2/25  HL still low after 1000 unit bolus and drip @ 1950.  No IV interuptions or bleeding noted per RN.   Goal of Therapy:  Heparin level 0.3-0.7 units/ml Monitor platelets by anticoagulation protocol: Yes   Plan:  Increase Heparin drip to 2200 units/hr  Recheck HL in 6 hours.  Lorenza Evangelist PharmD 2:03 AM 09/08/2012

## 2012-09-08 NOTE — Progress Notes (Addendum)
ANTICOAGULATION CONSULT NOTE   Pharmacy Consult for Heparin/Warfarin Indication: atrial fibrillation  No Known Allergies  Patient Measurements: Height: 6' (182.9 cm) Weight: 381 lb 6.3 oz (173 kg) IBW/kg (Calculated) : 77.6 Heparin Dosing Weight: 105 kg  Vital Signs: Temp: 98.3 F (36.8 C) (02/28 0618) Temp src: Oral (02/28 0618) BP: 118/46 mmHg (02/28 0618) Pulse Rate: 72 (02/28 0618)  Labs:  Recent Labs  09/06/12 2000 09/07/12 0522  09/07/12 1659 09/08/12 0045 09/08/12 0527 09/08/12 0745  HGB 12.0* 11.6*  --   --   --  10.5*  --   HCT 35.9* 34.1*  --   --   --  32.0*  --   PLT 268 271  --   --   --  232  --   APTT 44*  --   --   --   --   --   --   LABPROT 17.8* 19.0*  --   --   --  19.4*  --   INR 1.51* 1.65*  --   --   --  1.70*  --   HEPARINUNFRC  --   --   < > 0.16* 0.20*  --  0.30  CREATININE 1.50* 1.22  --   --   --  1.39*  --   < > = values in this interval not displayed.  Estimated Creatinine Clearance: 92.6 ml/min (by C-G formula based on Cr of 1.39).   Medical History: Past Medical History  Diagnosis Date  . Diabetes mellitus   . Hypertension   . Rheumatoid arthritis   . Neuropathy   . CHF (congestive heart failure)   . History of MI (myocardial infarction)     SILENT MI IN PAST   . CAD (coronary artery disease)   . Atrial fibrillation   . Anemia   . Sleep apnea      SEVERE, NO CPAP USED, DOES NOT KNOW  WHERE SLEEP STUDY DONE    Medications:  Scheduled:  . atorvastatin  20 mg Oral q1800  . gabapentin  300 mg Oral BID  . [COMPLETED] heparin  1,000 Units Intravenous Once  . [COMPLETED] heparin  2,000 Units Intravenous Once  . lisinopril  20 mg Oral Daily   And  . hydrochlorothiazide  12.5 mg Oral Daily  . insulin aspart  0-20 Units Subcutaneous TID WC  . insulin aspart  0-5 Units Subcutaneous QHS  . insulin aspart  4 Units Subcutaneous TID WC  . insulin glargine  70 Units Subcutaneous QHS  . metaxalone  800 mg Oral Daily  .  multivitamin with minerals  1 tablet Oral Daily  . piperacillin-tazobactam (ZOSYN)  IV  3.375 g Intravenous Q8H  . [COMPLETED] pneumococcal 23 valent vaccine  0.5 mL Intramuscular Tomorrow-1000  . sodium chloride  3 mL Intravenous Q12H  . vancomycin  1,500 mg Intravenous Q12H  . [COMPLETED] warfarin  7.5 mg Oral ONCE-1800  . Warfarin - Pharmacist Dosing Inpatient   Does not apply q1800  . [DISCONTINUED] vancomycin  2,000 mg Intravenous Q24H   Infusions:  . sodium chloride 10 mL/hr at 09/07/12 0152  . heparin 2,200 Units/hr (09/08/12 0412)  . [DISCONTINUED] heparin 1,400 Units/hr (09/07/12 0300)  . [DISCONTINUED] heparin 1,950 Units/hr (09/07/12 1708)    Assessment:  62 yo M on chronic warfarin for atrial fibrillation, admitted with infected pilonidal cyst.  Warfarin was held in anticipation of possible need for I&D, per surgery will not need I&D.  Warfarin resumed 2/27, now pt on  D#2 Warfarin/IV heparin bridge.    Warf PTA dosage: 7.5mg  Th, Sat; 5mg  other days--Last dose 2/25  INR rising, still subtherapeutic after dose resumed last night.   HL at low end of therapeutic range (0.3).  Will increase infusion slightly to ensure level does not fall below 0.3.    No IV interuptions or bleeding noted per RN.   Goal of Therapy:  Heparin level 0.3-0.7 units/ml Monitor platelets by anticoagulation protocol: Yes   Plan:  Increase Heparin drip to 2300 units/hr  Recheck HL in 6 hours.  Warfarin 7.5mg  po x 1 tonight.   Haynes Hoehn, PharmD 09/08/2012 8:55 AM  Pager: [phone]

## 2012-09-08 NOTE — Progress Notes (Signed)
Inpatient Diabetes Program Recommendations  AACE/ADA: New Consensus Statement on Inpatient Glycemic Control (2013)  Target Ranges:  Prepandial:   less than 140 mg/dL      Peak postprandial:   less than 180 mg/dL (1-2 hours)      Critically ill patients:  140 - 180 mg/dL   Reason for Visit: Hyperglycemia and Elevated HgbA1C  Results for [name], [name] (MRN [id number]) as of 09/08/2012 16:59  Ref. Range 09/07/2012 16:50 09/07/2012 21:05 09/08/2012 07:29 09/08/2012 11:44 09/08/2012 16:11  Glucose-Capillary Latest Range: 70-99 mg/dL 409 (H) 811 (H) 914 (H) 311 (H) 276 (H)  Results for [name], [name] (MRN [id number]) as of 09/08/2012 16:59  Ref. Range 09/08/2012 05:27  Hemoglobin A1C Latest Range: <5.7 % 10.1 (H)    Inpatient Diabetes Program Recommendations Insulin - Basal: Lantus increased - Add 20 units QAM Correction (SSI): Added HS correction Insulin - Meal Coverage: Increased to 10 units tid HgbA1C: 10.1 on 09/08/2012 Outpatient Referral: OP Diabetes Education consult for uncontrolled DM  Note: Will order Living Well With Diabetes book from pharmacy.    Will follow.  Thank you. Ailene Ards, RD, LDN, CDE Inpatient Diabetes Coordinator [phone]

## 2012-09-09 LAB — CBC
HCT: 31.1 % — ABNORMAL LOW (ref 39.0–52.0)
MCHC: 32.8 g/dL (ref 30.0–36.0)
MCV: 80.8 fL (ref 78.0–100.0)
Platelets: 231 10*3/uL (ref 150–400)
RDW: 13.8 % (ref 11.5–15.5)
WBC: 9.6 10*3/uL (ref 4.0–10.5)

## 2012-09-09 LAB — BASIC METABOLIC PANEL
BUN: 13 mg/dL (ref 6–23)
Chloride: 100 mEq/L (ref 96–112)
Creatinine, Ser: 1.28 mg/dL (ref 0.50–1.35)
GFR calc Af Amer: 69 mL/min — ABNORMAL LOW (ref >90)
GFR calc non Af Amer: 59 mL/min — ABNORMAL LOW (ref >90)
Potassium: 3.7 mEq/L (ref 3.5–5.1)

## 2012-09-09 LAB — PROTIME-INR: Prothrombin Time: 20.7 seconds — ABNORMAL HIGH (ref 11.6–15.2)

## 2012-09-09 LAB — HEPARIN LEVEL (UNFRACTIONATED): Heparin Unfractionated: 0.37 IU/mL (ref 0.30–0.70)

## 2012-09-09 LAB — GLUCOSE, CAPILLARY
Glucose-Capillary: 261 mg/dL — ABNORMAL HIGH (ref 70–99)
Glucose-Capillary: 260 mg/dL — ABNORMAL HIGH (ref 70–99)
Glucose-Capillary: 270 mg/dL — ABNORMAL HIGH (ref 70–99)

## 2012-09-09 MED ORDER — WARFARIN SODIUM 7.5 MG PO TABS
7.5000 mg | ORAL_TABLET | Freq: Once | ORAL | Status: AC
  Administered 2012-09-09: 7.5 mg via ORAL
  Filled 2012-09-09: qty 1

## 2012-09-09 MED ORDER — INSULIN GLARGINE 100 UNIT/ML ~~LOC~~ SOLN
20.0000 [IU] | Freq: Every day | SUBCUTANEOUS | Status: DC
  Administered 2012-09-09: 20 [IU] via SUBCUTANEOUS

## 2012-09-09 MED ORDER — FUROSEMIDE 40 MG PO TABS
40.0000 mg | ORAL_TABLET | Freq: Two times a day (BID) | ORAL | Status: DC
  Administered 2012-09-09 – 2012-09-10 (×2): 40 mg via ORAL
  Filled 2012-09-09 (×4): qty 1

## 2012-09-09 MED ORDER — OXYCODONE HCL ER 15 MG PO T12A: 15.0000 mg | EXTENDED_RELEASE_TABLET | Freq: Two times a day (BID) | ORAL | Status: DC

## 2012-09-09 MED ORDER — METOPROLOL TARTRATE 12.5 MG HALF TABLET
12.5000 mg | ORAL_TABLET | Freq: Two times a day (BID) | ORAL | Status: DC
  Administered 2012-09-09 – 2012-09-10 (×3): 12.5 mg via ORAL
  Filled 2012-09-09 (×4): qty 1

## 2012-09-09 MED ORDER — HYDROMORPHONE HCL PF 1 MG/ML IJ SOLN
1.0000 mg | INTRAMUSCULAR | Status: DC | PRN
  Administered 2012-09-09 – 2012-09-10 (×5): 1 mg via INTRAVENOUS
  Filled 2012-09-09 (×5): qty 1

## 2012-09-09 MED ORDER — OXYCODONE HCL ER 20 MG PO T12A
20.0000 mg | EXTENDED_RELEASE_TABLET | Freq: Two times a day (BID) | ORAL | Status: DC
  Administered 2012-09-09 – 2012-09-10 (×3): 20 mg via ORAL
  Filled 2012-09-09 (×3): qty 1

## 2012-09-09 MED ORDER — POTASSIUM CHLORIDE CRYS ER 20 MEQ PO TBCR
20.0000 meq | EXTENDED_RELEASE_TABLET | Freq: Two times a day (BID) | ORAL | Status: DC
  Administered 2012-09-09 – 2012-09-10 (×3): 20 meq via ORAL
  Filled 2012-09-09 (×4): qty 1

## 2012-09-09 MED ORDER — INSULIN GLARGINE 100 UNIT/ML ~~LOC~~ SOLN: 30.0000 [IU] | Freq: Every day | SUBCUTANEOUS | Status: DC

## 2012-09-09 NOTE — Progress Notes (Signed)
ANTICOAGULATION CONSULT NOTE   Pharmacy Consult for Heparin/Warfarin Indication: atrial fibrillation  No Known Allergies  Patient Measurements: Height: 6' (182.9 cm) Weight: 385 lb (174.635 kg) IBW/kg (Calculated) : 77.6 Heparin Dosing Weight: 105 kg  Vital Signs: Temp: 97.3 F (36.3 C) (03/01 0618) Temp src: Oral (03/01 0618) BP: 135/47 mmHg (03/01 0618) Pulse Rate: 72 (03/01 0618)  Labs:  Recent Labs  09/06/12 2000 09/07/12 0522  09/08/12 0527 09/08/12 0745 09/08/12 1503 09/09/12 0540  HGB 12.0* 11.6*  --  10.5*  --   --  10.2*  HCT 35.9* 34.1*  --  32.0*  --   --  31.1*  PLT 268 271  --  232  --   --  231  APTT 44*  --   --   --   --   --   --   LABPROT 17.8* 19.0*  --  19.4*  --   --  20.7*  INR 1.51* 1.65*  --  1.70*  --   --  1.85*  HEPARINUNFRC  --   --   < >  --  0.30 0.37 0.37  CREATININE 1.50* 1.22  --  1.39*  --   --  1.28  < > = values in this interval not displayed.  Estimated Creatinine Clearance: 101 ml/min (by C-G formula based on Cr of 1.28).   Medications:  Scheduled:  . atorvastatin  20 mg Oral q1800  . docusate sodium  100 mg Oral BID  . gabapentin  600 mg Oral BID  . lisinopril  20 mg Oral Daily   And  . hydrochlorothiazide  12.5 mg Oral Daily  . insulin aspart  0-20 Units Subcutaneous TID WC  . insulin aspart  0-5 Units Subcutaneous QHS  . insulin aspart  10 Units Subcutaneous TID WC  . insulin glargine  20 Units Subcutaneous Daily  . insulin glargine  70 Units Subcutaneous QHS  . [COMPLETED] living well with diabetes book   Does not apply Once  . metaxalone  800 mg Oral Daily  . multivitamin with minerals  1 tablet Oral Daily  . oxycodone  30 mg Oral Q6H  . piperacillin-tazobactam (ZOSYN)  IV  3.375 g Intravenous Q8H  . sodium chloride  3 mL Intravenous Q12H  . vancomycin  1,500 mg Intravenous Q12H  . [COMPLETED] warfarin  7.5 mg Oral ONCE-1800  . Warfarin - Pharmacist Dosing Inpatient   Does not apply q1800  . [DISCONTINUED]  gabapentin  300 mg Oral BID  . [DISCONTINUED] insulin aspart  4 Units Subcutaneous TID WC  . [DISCONTINUED] insulin glargine  20 Units Subcutaneous Daily  . [DISCONTINUED] insulin glargine  30 Units Subcutaneous Daily  . [DISCONTINUED] warfarin  5 mg Oral ONCE-1800   Infusions:  . sodium chloride 10 mL/hr (09/09/12 0235)  . heparin 2,300 Units/hr (09/09/12 0235)    Assessment:  62 yo M on chronic warfarin for atrial fibrillation, admitted with infected pilonidal cyst.  Warfarin was held in anticipation of possible need for I&D, but per surgery, will not need I&D.  Warfarin resumed 2/27, now pt on D#3 Warfarin/IV heparin bridge.    Warf PTA dosage: 7.5mg  Th, Sat; 5mg  other days--Last dose 2/25 prior to admission.  INR rising, still subtherapeutic.  HL remains therapeutic.  Platelets wnl  No bleeding reported in chart notes.  Goal of Therapy:  Heparin level 0.3-0.7 units/ml Monitor platelets by anticoagulation protocol: Yes   Plan:  Warfarin 7.5mg  po x 1 tonight.   Continue heparin  at 2300 units/hr  F/U daily HL, INR, and CBC  Darrol Angel, PharmD Pager: [phone] 09/09/2012 9:08 AM

## 2012-09-09 NOTE — Progress Notes (Signed)
  Subjective: Pt without complaint  Objective: Vital signs in last 24 hours: Temp:  [97.3 F (36.3 C)-97.6 F (36.4 C)] 97.3 F (36.3 C) (03/01 0618) Pulse Rate:  [72-88] 72 (03/01 0618) Resp:  [18-22] 22 (03/01 0618) BP: (121-135)/(42-51) 135/47 mmHg (03/01 0618) SpO2:  [98 %-100 %] 98 % (03/01 0618) Weight:  [385 lb (174.635 kg)] 385 lb (174.635 kg) (03/01 0500) Last BM Date: 09/08/12  Intake/Output from previous day: 02/28 0701 - 03/01 0700 In: 1351 [P.O.:720; I.V.:332; IV Piggyback:299] Out: -  Intake/Output this shift: Total I/O In: 240 [P.O.:240] Out: -   Incision/Wound:gluteal cleft examined,  Min redness.  Not fluctuant.  No drainage.  Soft min tenderness  Lab Results:   Recent Labs  09/08/12 0527 09/09/12 0540  WBC 11.1* 9.6  HGB 10.5* 10.2*  HCT 32.0* 31.1*  PLT 232 231   BMET  Recent Labs  09/08/12 0527 09/09/12 0540  NA 131* 134*  K 3.8 3.7  CL 95* 100  CO2 26 25  GLUCOSE 222* 265*  BUN 17 13  CREATININE 1.39* 1.28  CALCIUM 8.4 8.5   PT/INR  Recent Labs  09/08/12 0527 09/09/12 0540  LABPROT 19.4* 20.7*  INR 1.70* 1.85*   ABG No results found for this basename: PHART, PCO2, PO2, HCO3,  in the last 72 hours  Studies/Results: No results found.  Anti-infectives: Anti-infectives   Start     Dose/Rate Route Frequency Ordered Stop   09/07/12 2200  vancomycin (VANCOCIN) 2,000 mg in sodium chloride 0.9 % 500 mL IVPB  Status:  Discontinued     2,000 mg 250 mL/hr over 120 Minutes Intravenous Every 24 hours 09/07/12 0247 09/07/12 0918   09/07/12 2000  vancomycin (VANCOCIN) 1,500 mg in sodium chloride 0.9 % 500 mL IVPB     1,500 mg 250 mL/hr over 120 Minutes Intravenous Every 12 hours 09/07/12 0918     09/07/12 0300  piperacillin-tazobactam (ZOSYN) IVPB 2.25 g  Status:  Discontinued     2.25 g 100 mL/hr over 30 Minutes Intravenous 4 times per day 09/07/12 0153 09/07/12 0156   09/07/12 0300  vancomycin (VANCOCIN) 1,500 mg in sodium  chloride 0.9 % 500 mL IVPB     1,500 mg 250 mL/hr over 120 Minutes Intravenous  Once 09/07/12 0240 09/07/12 0836   09/07/12 0200  piperacillin-tazobactam (ZOSYN) IVPB 3.375 g     3.375 g 12.5 mL/hr over 240 Minutes Intravenous Every 8 hours 09/07/12 0156     09/06/12 2330  vancomycin (VANCOCIN) IVPB 1000 mg/200 mL premix     1,000 mg 200 mL/hr over 60 Minutes Intravenous  Once 09/06/12 2306 09/07/12 0101      Assessment/Plan: Pilonidal wound   Clean without abscess Continue antibiotics No role for surgery currently Can follow up at [facility] with Dr Jamey Ripa as outpatient if needed.  LOS: 3 days    Emeric Novinger A. 09/09/2012

## 2012-09-09 NOTE — Progress Notes (Addendum)
ANTIBIOTIC CONSULT NOTE - Follow Up  Pharmacy Consult for Vancomycin and Zosyn Indication: Pilonidal cyst with infection and surrounding cellulitis  No Known Allergies  Patient Measurements: Height: 6' (182.9 cm) Weight: 385 lb (174.635 kg) IBW/kg (Calculated) : 77.6 Adjusted Body Weight: 105 kg  Labs:  Recent Labs  09/07/12 0522 09/08/12 0527 09/09/12 0540  WBC 13.6* 11.1* 9.6  HGB 11.6* 10.5* 10.2*  PLT 271 232 231  CREATININE 1.22 1.39* 1.28   Estimated Creatinine Clearance: 101 ml/min (by C-G formula based on Cr of 1.28).    Microbiology: 2/26 blood x2: NGTD  Antiobiotics:  2/26 >>Vanco>> 2/26 >>Zosyn >>   Assessment: 62 yo M admitted 09-06-12, currently on Day #4 Vanco 1500mg  IV q12h and Zosyn 3.375g IV q8h (4hr infusion) for Pilonidal cyst with infection and surrounding cellulitis. 2/26 CT showed: "no evidence of fluid collection to suggest abscess. No soft tissue gas collections." Of note, pt has a hx of uncontrolled DM2 and obesity (wt = 175kg, BMI=52) - because of this, will wait to check Vanco trough until after the 6th dose has been given. Pt is afebrile, blood cultures remain negative. No plans fur surgery intervention.  Goal of Therapy:  Vancomycin trough level 15-20 mcg/ml  Plan:   Continue current Vanco and Zosyn regimen  Will check a trough tomorrow before 8am Vanco dose is given  F/U antibiotics plans  Darrol Angel, PharmD Pager: [phone] 09/09/2012,9:33 AM

## 2012-09-09 NOTE — Discharge Summary (Signed)
Physician Discharge Summary  SACRAMENTO MONDS BJY:[id number] DOB: 03/30/1951 DOA: 09/06/2012  PCP: Jeri Cos, MD  Admit date: 09/06/2012 Discharge date: 09/10/2012  Recommendations for Outpatient Follow-up:  1. Follow up with physician within 1 week for review of fingersticks and pilonidal cyst check 2. Surgery in 2-3 weeks for possibly excision of pilonidal cyst 3. PCP in 3 weeks for follow up of the above, consider referral to endocrinology.   4. Pain clinic within 1 month of discharge  Discharge Diagnoses:  Principal Problem:   Perirectal cellulitis Active Problems:   DIABETES MELLITUS-TYPE II   ANEMIA   Atrial fibrillation   UNSPEC COMBINED SYSTOLIC&DIASTOLIC HEART FAILURE   CAD (coronary artery disease)   Discharge Condition: stable, improved  Diet recommendation: diabetic  Wt Readings from Last 3 Encounters:  09/10/12 176.1 kg (388 lb 3.7 oz)  02/08/12 184.16 kg (406 lb)  11/12/11 185.43 kg (408 lb 12.8 oz)    History of present illness:   [name] is a 62 y.o. male history of diabetes mellitus type 2, atrial fibrillation Coumadin, CAD was referred to the ER after patient had persistent rectal pain or discharge. Patient has been having some perirectal pain 6 weeks ago. This has been gradually worsening with some discharge. Initially it started off a small lump in the left completely and eventually started involving the right and the perineal area. Patient started noticing discharge. Patient was referred to the wound center and eventually referred to the ER for further management. Patient denies any fever chills or any scrotal pain. Patient denies nausea vomiting or diarrhea. In the ER CT pelvis shows perirectal cellulitis and has been admitted for further management. In addition patient was found to be mildly tachycardic. Patient otherwise denies any chest pain or shortness of breath.   Hospital Course:   Pilonidal cyst with infection and surrounding cellulitis,  severe pain despite IV abx but nothing to drain per exam or CT pelvis.  He was started on IV vancomycin and zosyn and had improvement in swelling, redness, and odor.  He was evaluated by surgery, however, he did not have any fluid collections that required draining during this admission.  He will be transitioned to clindamycin to complete a 14 day course of antibiotics.    Pain control:  [name] is opiate dependent and tolerant and has previously required very high doses of long acting narcotic to control his pain.  During this admission, he initially required IV dilaudid q2h for pain control.  I continued his home oxycodone 30mg  IR tabs q4h-6h and added oxycontin 20mg  BID and increased his gabapentin to 600mg  BID.  He was given information about outpatient follow up with primary care and pain clinic.  At discharge, I gave him a 1 month supply of oxycodone IR and oxycontin.    Uncontrolled T2DM, likely due to insulin resistance in the setting of infection. A1c 10.1.  I increased his lantus from 70 units once daily to 30 units qAM and 70 units qHS.  He should continue his sliding scale [place] Lady acting insulin and metformin.  I encouraged him to exercise and lose weight.    Atrial fibrillation, currently in atrial fibrillation on telemetry with rate controlled. CHADS2 3.  He was bridged to therapeutic INR with heparin gtt.  He should increase his coumadin to 7.5mg  daily and have a repeat INR on Wednesday.    HTN/HLD: Blood pressure mildly elevated in setting of acute infection.  He continued lisinopril/HCTZ.  His lasix and spironolactone were  initially held and then restarted.  I also started low dose beta blocker.  He continued statin.    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  Continued ACEI.  I added metoprolol, which the patient thinks he is supposed to be on, but cannot remember what dose.  His diuretics were held initially but restarted.    Diabetic neuropathy, stable. Gabapentin  was increased as above.    Leukocytosis, trending down on antibiotics, likely related to cellulitis.    Normocytic anemia, stable. Defer further evaluation to PCP if not already complete   CKD stage 3, stable.   Constipation, likely related to narcotic use.  Added colace BID and senna qhs with magnesium citrate as needed.    Consultants:  General surgery Procedures:  CT pelvis  Antibiotics:  vanc 2/27 >> 3/2 Zosyn 2/27 >> 3/2  Clindamycin at home   Discharge Exam: Filed Vitals:   09/10/12 0537  BP: 122/59  Pulse: 72  Temp: 97.7 F (36.5 C)  Resp: 20   Filed Vitals:   09/09/12 0618 09/09/12 1412 09/09/12 2219 09/10/12 0537  BP: 135/47 121/56 131/43 122/59  Pulse: 72 77 70 72  Temp: 97.3 F (36.3 C) 98.2 F (36.8 C) 97.4 F (36.3 C) 97.7 F (36.5 C)  TempSrc: Oral Oral Oral Oral  Resp: 22 20 20 20   Height:      Weight:    176.1 kg (388 lb 3.7 oz)  SpO2: 98% 99% 96% 97%    General: Obese CM, No acute distress  HEENT: NCAT, MMM  Cardiovascular:  IRRR, nl S1, S2 no mrg, 2+ pulses, warm extremities  Respiratory: CTAB, no increased WOB  Abdomen: NABS, soft, NT/ND  MSK: Normal tone and bulk, 1+ LEE  Neuro: Grossly moves all extremities and ambulates during exam  Skin: Upper buttock crease with draining 5mm opening with less foul odor and clear drainage. Mild surrounding induration versus skin thickening and less erythema.    Discharge Instructions      Discharge Orders   Future Orders Complete By Expires     Ambulatory referral to Nutrition and Diabetic Education  As directed     Comments:      HgbA1C - 10.1%    Call MD for:  difficulty breathing, headache or visual disturbances  As directed     Call MD for:  extreme fatigue  As directed     Call MD for:  hives  As directed     Call MD for:  persistant dizziness or light-headedness  As directed     Call MD for:  persistant nausea and vomiting  As directed     Call MD for:  severe uncontrolled pain  As  directed     Call MD for:  temperature >100.4  As directed     Diet - low sodium heart healthy  As directed     Diet Carb Modified  As directed     Discharge instructions  As directed     Comments:      You were hospitalized with an infected pilonidal cyst.  You were started on IV antibiotics and it appeared to improve.  Please continue clindamycin at home to complete a 14-day course of antibiotics (including the days in the hospital). Your pain medication was increased to include oxycontin 20mg  twice per day and increased gabapentin 600mg  twice daily.  Your diabetes is uncontrolled.  Please exercise and lose some additional weight.  Increase your lantus to 30 units in the  morning and 70 units in the evening and continue your meal time insulin as before.  You should be seen within the week by a doctor to titrate your insulin and you should get a referral to the endocrinology clinic by your new primary care doctor.  Please follow up with surgery in 2-3 weeks to discuss surgical excision of your cyst.  Finally, your INR was below target.  Please increase your warfarin to 7.5mg  daily and have your INR repeated on Wednesday at your usual location.    Increase activity slowly  As directed         Medication List    STOP taking these medications       oxyCODONE-acetaminophen 5-325 MG per tablet  Commonly known as:  PERCOCET/ROXICET      TAKE these medications       clindamycin 300 MG capsule  Commonly known as:  CLEOCIN  Take 2 capsules (600 mg total) by mouth 3 (three) times daily.     DSS 100 MG Caps  Take 100 mg by mouth 2 (two) times daily.     furosemide 40 MG tablet  Commonly known as:  LASIX  TAKE 1 TABLET BY MOUTH TWICE A DAY     gabapentin 300 MG capsule  Commonly known as:  NEURONTIN  Take 2 capsules (600 mg total) by mouth 2 (two) times daily.     insulin aspart 100 UNIT/ML injection  Commonly known as:  novoLOG  Inject 40-300 Units into the skin 3 (three) times daily  before meals. Sliding scale.     insulin glargine 100 UNIT/ML injection  Commonly known as:  LANTUS  Inject 30-70 Units into the skin 2 (two) times daily. Dispense FLEXPEN.  Inject 30 units in the morning and 70 units at night.     lisinopril-hydrochlorothiazide 20-12.5 MG per tablet  Commonly known as:  PRINZIDE,ZESTORETIC  Take 1 tablet by mouth daily.     metaxalone 800 MG tablet  Commonly known as:  SKELAXIN  Take 800 mg by mouth daily.     metoprolol tartrate 12.5 mg Tabs  Commonly known as:  LOPRESSOR  Take 0.5 tablets (12.5 mg total) by mouth 2 (two) times daily.     multivitamin with minerals Tabs  Take 1 tablet by mouth daily.     OxyCODONE 20 mg T12a  Commonly known as:  OXYCONTIN  Take 1 tablet (20 mg total) by mouth every 12 (twelve) hours.     oxycodone 30 MG immediate release tablet  Commonly known as:  ROXICODONE  Take 1 tablet (30 mg total) by mouth every 4 (four) hours as needed for pain. Pain     potassium chloride SA 20 MEQ tablet  Commonly known as:  K-DUR,KLOR-CON  Take 20 mEq by mouth 2 (two) times daily.     potassium chloride SA 20 MEQ tablet  Commonly known as:  K-DUR,KLOR-CON  Take 20 mEq by mouth 2 (two) times daily.     rosuvastatin 10 MG tablet  Commonly known as:  CRESTOR  Take 10 mg by mouth every evening.     senna 8.6 MG Tabs  Commonly known as:  SENOKOT  Take 2 tablets (17.2 mg total) by mouth at bedtime as needed.     spironolactone 25 MG tablet  Commonly known as:  ALDACTONE  Take 1 tablet (25 mg total) by mouth daily.     warfarin 5 MG tablet  Commonly known as:  COUMADIN  Take 1.5 tablets (7.5 mg total)  by mouth daily. Take 7.5 mg once each day on Thursday and Saturday; all other days take 5 mg once daily.       Follow-up Information   Follow up with Jeri Cos, MD. Schedule an appointment as soon as possible for a visit in 1 week.   Contact information:   [address] [place]  [place] [address] [phone]       Follow up with [facility]. Schedule an appointment as soon as possible for a visit in 1 month.   Contact information:   [address], [address] [id number] [place] [address] [phone]      Follow up with [facility]. Schedule an appointment as soon as possible for a visit in 3 weeks. (cyst excision)    Contact information:   [address] [place] [address] [phone]       The results of significant diagnostics from this hospitalization (including imaging, microbiology, ancillary and laboratory) are listed below for reference.    Significant Diagnostic Studies: Ct Pelvis W Contrast  09/06/2012  *RADIOLOGY REPORT*  Clinical Data:  The patient had a perianal abscess 3 months ago that was treated.  There was initial improvement but now has worsened.  CT PELVIS WITH CONTRAST  Technique:  Multidetector CT imaging of the pelvis was performed using the standard protocol following the bolus administration of intravenous contrast.  Contrast: OMNIPAQUE IOHEXOL 300 MG/ML  SOLN  Comparison:   None.  Findings:  CT examination of the pelvis is obtained with IV and rectal contrast material. Technically limited study due to the patient's body habitus. The There is infiltration in the perineal/perianal soft tissues to the left of midline extending from the region of the anus inferiorly.  This measures maximally about 16 x 66 mm.  There is associated skin thickening.  This could represent cellulitis or fistula.  No discrete tract is identified. There is no evidence of fluid collection to suggest abscess.  No soft tissue gas collections.  No contrast extravasation is noted.  Pelvic organs appear intact.  Bladder wall is not thickened. Prostate gland is not enlarged.  No free or loculated pelvic fluid collections.  Diverticula in the sigmoid colon without diverticulitis.  The appendix is  not identified.  IMPRESSION: Soft tissue infiltration in the left peri anal and inferior perineal fat with skin thickening suggesting cellulitis.  No fluid collection to suggest abscess.   Original Report Authenticated By: Burman Nieves, M.D.     Microbiology: Recent Results (from the past 240 hour(s))  CULTURE, BLOOD (ROUTINE X 2)     Status: None   Collection Time    09/06/12 11:45 PM      Result Value Range Status   Specimen Description BLOOD LEFT HAND   Final   Special Requests BOTTLES DRAWN AEROBIC AND ANAEROBIC 5CC EACH   Final   Culture  Setup Time 09/07/2012 08:36   Final   Culture     Final   Value:        BLOOD CULTURE RECEIVED NO GROWTH TO DATE CULTURE WILL BE HELD FOR 5 DAYS BEFORE ISSUING A FINAL NEGATIVE REPORT   Report Status PENDING   Incomplete  CULTURE, BLOOD (ROUTINE X 2)     Status: None   Collection Time    09/06/12 11:50 PM      Result Value Range Status   Specimen Description BLOOD RIGHT HAND  Final   Special Requests BOTTLES DRAWN AEROBIC AND ANAEROBIC 4  CC EACH   Final   Culture  Setup Time 09/07/2012 08:35   Final   Culture     Final   Value:        BLOOD CULTURE RECEIVED NO GROWTH TO DATE CULTURE WILL BE HELD FOR 5 DAYS BEFORE ISSUING A FINAL NEGATIVE REPORT   Report Status PENDING   Incomplete     Labs: Basic Metabolic Panel:  Recent Labs Lab 09/06/12 2000 09/07/12 0522 09/08/12 0527 09/09/12 0540 09/10/12 0456  NA 132* 135 131* 134* 131*  K 4.4 3.8 3.8 3.7 4.1  CL 94* 98 95* 100 96  CO2 24 25 26 25 24   GLUCOSE 409* 196* 222* 265* 256*  BUN 24* 20 17 13 13   CREATININE 1.50* 1.22 1.39* 1.28 1.37*  CALCIUM 9.0 8.7 8.4 8.5 8.6   Liver Function Tests: No results found for this basename: AST, ALT, ALKPHOS, BILITOT, PROT, ALBUMIN,  in the last 168 hours No results found for this basename: LIPASE, AMYLASE,  in the last 168 hours No results found for this basename: AMMONIA,  in the last 168 hours CBC:  Recent Labs Lab 09/06/12 2000  09/07/12 0522 09/08/12 0527 09/09/12 0540 09/10/12 0456  WBC 14.0* 13.6* 11.1* 9.6 11.5*  NEUTROABS 9.4*  --   --   --   --   HGB 12.0* 11.6* 10.5* 10.2* 10.4*  HCT 35.9* 34.1* 32.0* 31.1* 31.6*  MCV 80.5 80.4 80.4 80.8 81.2  PLT 268 271 232 231 249   Cardiac Enzymes: No results found for this basename: CKTOTAL, CKMB, CKMBINDEX, TROPONINI,  in the last 168 hours BNP: BNP (last 3 results) No results found for this basename: PROBNP,  in the last 8760 hours CBG:  Recent Labs Lab 09/09/12 1141 09/09/12 1705 09/09/12 2216 09/10/12 0714 09/10/12 1122  GLUCAP 297* 260* 270* 245* 278*    Time coordinating discharge: 45 minutes  Signed:  Neelie Welshans  [name] 09/10/2012, 5:31 PM

## 2012-09-09 NOTE — Progress Notes (Signed)
[name] PROGRESS NOTE  [name] ZOX:[id number] DOB: 12/10/1950 DOA: 09/06/2012 PCP: Jeri Cos, MD  Assessment/Plan  Pilonidal cyst with infection and surrounding cellulitis, severe pain despite IV abx but nothing to drain per exam or CT pelvis.  Requiring frequent IV dilaudid for pain control.  Patient has history of requiring high doses of oral narcotics and is likely very tolerant to their effects.   -  Continue vancomycin and zosyn -  Appreciate surgery assistance  -  Add oxycontin 20mg  BID -  Continue scheduled oxycodone for pain  -  Reduced frequency of IV dilaudid -  Continue gabapentin to 600mg  BID (near maximum dose given CrCl) -  Patient given information about PCP and pain clinics in the area yesterday  Uncontrolled T2DM, likely due to insulin resistance in the setting of infection.  A1c 10.1 -  Continue lantus 70 units qhs and add 20 units qAM (first dose today) -  Continue aspart 10 units AC -  Continue high dose SSI -  Continue HS insulin  Atrial fibrillation, currently in atrial fibrillation on telemetry with rate controlled.  CHADS2 3 -  Coumadin dosing per pharmacy with bridging heparin gtt  (per surgery, nothing to drain so procedure is less likely) -  Not on rate or rhythm control medications.  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  Will start at 12.5mg  BID and titrate -  Restart lasix as patient tolerating diet now and blood pressure stable.   -  Restart spironolactone tomorrow if BP stable on lasix and metoprolol   Diabetic neuropathy, stable.  Continue gabapentin  Leukocytosis, trending down on antibiotics, likely related to cellulitis  Normocytic anemia, stable.  Tx for hgb < 7 -  Defer further evaluation to PCP if not already  complete CKD stage 3, stable.    Constipation, likely related to narcotic use -  Add colace BID and senna qhs -  Magnesium citrate prn  Diet:  diabetic Access:  piv IVF:  kvo Proph:  Heparin gtt and warfarin  Code Status: full code Family Communication: spoke with patient alone Disposition Plan: pending further improvement in cellulitis to home.     Consultants:  General surgery  Procedures:  CT pelvis   Antibiotics:  vanc 2/27 >>  Zosyn 2/27 >>   HPI/Subjective:  Denies fevers, chills, headache, chest pain, shortness of breath, nausea, vomiting, diarrhea.  Having daily BM.  Pain is stable today but has been requiring frequent IV dilaudid.     Objective: Filed Vitals:   09/08/12 1411 09/08/12 2105 09/09/12 0500 09/09/12 0618  BP: 127/51 121/42  135/47  Pulse: 88 82  72  Temp: 97.3 F (36.3 C) 97.6 F (36.4 C)  97.3 F (36.3 C)  TempSrc: Oral Oral  Oral  Resp: 18 20  22   Height:      Weight:   174.635 kg (385 lb)   SpO2: 100% 98%  98%    Intake/Output Summary (Last 24 hours) at 09/09/12 1141 Last data filed at 09/09/12 0913  Gross per 24 hour  Intake   1052 ml  Output      0 ml  Net   1052 ml   Filed Weights   09/07/12 0142 09/08/12 0618 09/09/12 0500  Weight: 171.1 kg (377 lb 3.3 oz)  173 kg (381 lb 6.3 oz) 174.635 kg (385 lb)    Exam:   General:  Obese CM, No acute distress  HEENT:  NCAT, MMM  Cardiovascular:  IRRR, nl S1, S2 no mrg, 2+ pulses, warm extremities  Respiratory:   CTAB, no increased WOB  Abdomen:   NABS, soft, NT/ND  MSK:   Normal tone and bulk, 1+ LEE  Neuro:  Grossly moves all extremities and ambulates during exam  Skin:  Upper buttock crease with draining 5mm opening with foul odor and yellow drainage.  Mild surrounding induration versus skin thickening and less erythema.     Data Reviewed: Basic Metabolic Panel:  Recent Labs Lab 09/06/12 2000 09/07/12 0522 09/08/12 0527 09/09/12 0540  NA 132* 135 131* 134*  K  4.4 3.8 3.8 3.7  CL 94* 98 95* 100  CO2 24 25 26 25   GLUCOSE 409* 196* 222* 265*  BUN 24* 20 17 13   CREATININE 1.50* 1.22 1.39* 1.28  CALCIUM 9.0 8.7 8.4 8.5   Liver Function Tests: No results found for this basename: AST, ALT, ALKPHOS, BILITOT, PROT, ALBUMIN,  in the last 168 hours No results found for this basename: LIPASE, AMYLASE,  in the last 168 hours No results found for this basename: AMMONIA,  in the last 168 hours CBC:  Recent Labs Lab 09/06/12 2000 09/07/12 0522 09/08/12 0527 09/09/12 0540  WBC 14.0* 13.6* 11.1* 9.6  NEUTROABS 9.4*  --   --   --   HGB 12.0* 11.6* 10.5* 10.2*  HCT 35.9* 34.1* 32.0* 31.1*  MCV 80.5 80.4 80.4 80.8  PLT 268 271 232 231   Cardiac Enzymes: No results found for this basename: CKTOTAL, CKMB, CKMBINDEX, TROPONINI,  in the last 168 hours BNP (last 3 results) No results found for this basename: PROBNP,  in the last 8760 hours CBG:  Recent Labs Lab 09/08/12 0729 09/08/12 1144 09/08/12 1611 09/08/12 2109 09/09/12 0756  GLUCAP 207* 311* 276* 281* 261*    Recent Results (from the past 240 hour(s))  CULTURE, BLOOD (ROUTINE X 2)     Status: None   Collection Time    09/06/12 11:45 PM      Result Value Range Status   Specimen Description BLOOD LEFT HAND   Final   Special Requests BOTTLES DRAWN AEROBIC AND ANAEROBIC 5CC EACH   Final   Culture  Setup Time 09/07/2012 08:36   Final   Culture     Final   Value:        BLOOD CULTURE RECEIVED NO GROWTH TO DATE CULTURE WILL BE HELD FOR 5 DAYS BEFORE ISSUING A FINAL NEGATIVE REPORT   Report Status PENDING   Incomplete  CULTURE, BLOOD (ROUTINE X 2)     Status: None   Collection Time    09/06/12 11:50 PM      Result Value Range Status   Specimen Description BLOOD RIGHT HAND   Final   Special Requests BOTTLES DRAWN AEROBIC AND ANAEROBIC 4  CC EACH   Final   Culture  Setup Time 09/07/2012 08:35   Final   Culture     Final   Value:        BLOOD CULTURE RECEIVED NO GROWTH TO DATE CULTURE WILL  BE HELD FOR 5 DAYS BEFORE ISSUING A FINAL NEGATIVE REPORT   Report Status PENDING   Incomplete     Studies: No results found.  Scheduled Meds: . atorvastatin  20 mg Oral q1800  . docusate sodium  100 mg Oral  BID  . gabapentin  600 mg Oral BID  . lisinopril  20 mg Oral Daily   And  . hydrochlorothiazide  12.5 mg Oral Daily  . insulin aspart  0-20 Units Subcutaneous TID WC  . insulin aspart  0-5 Units Subcutaneous QHS  . insulin aspart  10 Units Subcutaneous TID WC  . insulin glargine  20 Units Subcutaneous Daily  . insulin glargine  70 Units Subcutaneous QHS  . metaxalone  800 mg Oral Daily  . multivitamin with minerals  1 tablet Oral Daily  . oxycodone  30 mg Oral Q6H  . OxyCODONE  20 mg Oral Q12H  . piperacillin-tazobactam (ZOSYN)  IV  3.375 g Intravenous Q8H  . sodium chloride  3 mL Intravenous Q12H  . vancomycin  1,500 mg Intravenous Q12H  . warfarin  7.5 mg Oral ONCE-1800  . Warfarin - Pharmacist Dosing Inpatient   Does not apply q1800   Continuous Infusions: . sodium chloride 10 mL/hr (09/09/12 0235)  . heparin 2,300 Units/hr (09/09/12 0235)    Principal Problem:   Perirectal cellulitis Active Problems:   DIABETES MELLITUS-TYPE II   ANEMIA   Atrial fibrillation   UNSPEC COMBINED SYSTOLIC&DIASTOLIC HEART FAILURE   CAD (coronary artery disease)    Time spent: 30 min    Virdia Ziesmer, [facility]  [name] Pager [phone]. If 7PM-7AM, please contact night-coverage at [email or web address], password [facility] 09/09/2012, 11:41 AM  LOS: 3 days

## 2012-09-10 LAB — BASIC METABOLIC PANEL: Sodium: 131 mEq/L — ABNORMAL LOW (ref 135–145)

## 2012-09-10 LAB — CBC
MCH: 26.7 pg (ref 26.0–34.0)
MCV: 81.2 fL (ref 78.0–100.0)
Platelets: 249 10*3/uL (ref 150–400)
RDW: 13.9 % (ref 11.5–15.5)

## 2012-09-10 LAB — VANCOMYCIN, TROUGH: Vancomycin Tr: 16.9 ug/mL (ref 10.0–20.0)

## 2012-09-10 LAB — GLUCOSE, CAPILLARY
Glucose-Capillary: 245 mg/dL — ABNORMAL HIGH (ref 70–99)
Glucose-Capillary: 278 mg/dL — ABNORMAL HIGH (ref 70–99)

## 2012-09-10 LAB — HEPARIN LEVEL (UNFRACTIONATED): Heparin Unfractionated: 0.22 IU/mL — ABNORMAL LOW (ref 0.30–0.70)

## 2012-09-10 MED ORDER — WARFARIN SODIUM 5 MG PO TABS: 7.5000 mg | ORAL_TABLET | Freq: Every day | ORAL | Status: DC

## 2012-09-10 MED ORDER — INSULIN GLARGINE 100 UNIT/ML ~~LOC~~ SOLN
30.0000 [IU] | Freq: Every day | SUBCUTANEOUS | Status: DC
  Administered 2012-09-10: 30 [IU] via SUBCUTANEOUS

## 2012-09-10 MED ORDER — DSS 100 MG PO CAPS: 100.0000 mg | ORAL_CAPSULE | Freq: Two times a day (BID) | ORAL | Status: DC

## 2012-09-10 MED ORDER — WARFARIN SODIUM 7.5 MG PO TABS
7.5000 mg | ORAL_TABLET | Freq: Once | ORAL | Status: DC
  Filled 2012-09-10: qty 1

## 2012-09-10 MED ORDER — GABAPENTIN 300 MG PO CAPS: 600.0000 mg | ORAL_CAPSULE | Freq: Two times a day (BID) | ORAL | Status: DC

## 2012-09-10 MED ORDER — INSULIN ASPART 100 UNIT/ML ~~LOC~~ SOLN
15.0000 [IU] | Freq: Three times a day (TID) | SUBCUTANEOUS | Status: DC
  Administered 2012-09-10 (×2): 15 [IU] via SUBCUTANEOUS

## 2012-09-10 MED ORDER — OXYCODONE HCL ER 20 MG PO T12A: 20.0000 mg | EXTENDED_RELEASE_TABLET | Freq: Two times a day (BID) | ORAL | Status: DC

## 2012-09-10 MED ORDER — CLINDAMYCIN HCL 300 MG PO CAPS: 600.0000 mg | ORAL_CAPSULE | Freq: Three times a day (TID) | ORAL | Status: DC

## 2012-09-10 MED ORDER — SPIRONOLACTONE 25 MG PO TABS: 25.0000 mg | ORAL_TABLET | Freq: Every day | ORAL | Status: DC

## 2012-09-10 MED ORDER — INSULIN GLARGINE 100 UNIT/ML ~~LOC~~ SOLN: 30.0000 [IU] | Freq: Two times a day (BID) | SUBCUTANEOUS | Status: DC

## 2012-09-10 MED ORDER — SENNA 8.6 MG PO TABS: 2.0000 | ORAL_TABLET | Freq: Every evening | ORAL | Status: DC | PRN

## 2012-09-10 MED ORDER — METOPROLOL TARTRATE 12.5 MG HALF TABLET: 12.5000 mg | ORAL_TABLET | Freq: Two times a day (BID) | ORAL | Status: DC

## 2012-09-10 MED ORDER — OXYCODONE HCL 30 MG PO TABS: 30.0000 mg | ORAL_TABLET | ORAL | Status: DC | PRN

## 2012-09-10 NOTE — Progress Notes (Signed)
ANTICOAGULATION CONSULT NOTE   Pharmacy Consult for Heparin/Warfarin Indication: atrial fibrillation  No Known Allergies  Patient Measurements: Height: 6' (182.9 cm) Weight: 388 lb 3.7 oz (176.1 kg) IBW/kg (Calculated) : 77.6 Heparin Dosing Weight: 105 kg  Vital Signs: Temp: 97.7 F (36.5 C) (03/02 0537) Temp src: Oral (03/02 0537) BP: 122/59 mmHg (03/02 0537) Pulse Rate: 72 (03/02 0537)  Labs:  Recent Labs  09/08/12 0527  09/08/12 1503 09/09/12 0540 09/10/12 0456  HGB 10.5*  --   --  10.2* 10.4*  HCT 32.0*  --   --  31.1* 31.6*  PLT 232  --   --  231 249  LABPROT 19.4*  --   --  20.7* 22.0*  INR 1.70*  --   --  1.85* 2.01*  HEPARINUNFRC  --   < > 0.37 0.37 0.22*  CREATININE 1.39*  --   --  1.28 1.37*  < > = values in this interval not displayed.  Estimated Creatinine Clearance: 94.9 ml/min (by C-G formula based on Cr of 1.37).   Medications:  Scheduled:  . atorvastatin  20 mg Oral q1800  . docusate sodium  100 mg Oral BID  . furosemide  40 mg Oral BID  . gabapentin  600 mg Oral BID  . lisinopril  20 mg Oral Daily   And  . hydrochlorothiazide  12.5 mg Oral Daily  . insulin aspart  0-20 Units Subcutaneous TID WC  . insulin aspart  0-5 Units Subcutaneous QHS  . insulin aspart  15 Units Subcutaneous TID WC  . insulin glargine  30 Units Subcutaneous Daily  . insulin glargine  70 Units Subcutaneous QHS  . metaxalone  800 mg Oral Daily  . metoprolol tartrate  12.5 mg Oral BID  . multivitamin with minerals  1 tablet Oral Daily  . oxycodone  30 mg Oral Q6H  . OxyCODONE  20 mg Oral Q12H  . piperacillin-tazobactam (ZOSYN)  IV  3.375 g Intravenous Q8H  . potassium chloride  20 mEq Oral BID  . sodium chloride  3 mL Intravenous Q12H  . vancomycin  1,500 mg Intravenous Q12H  . [COMPLETED] warfarin  7.5 mg Oral ONCE-1800  . Warfarin - Pharmacist Dosing Inpatient   Does not apply q1800  . [DISCONTINUED] insulin aspart  10 Units Subcutaneous TID WC  . [DISCONTINUED]  insulin glargine  20 Units Subcutaneous Daily  . [DISCONTINUED] OxyCODONE  15 mg Oral Q12H   Infusions:  . sodium chloride 10 mL/hr at 09/09/12 1901  . heparin 2,300 Units/hr (09/10/12 4401)    Assessment:  62 yo M on chronic warfarin for atrial fibrillation, admitted with infected pilonidal cyst.  Warfarin was held in anticipation of possible need for I&D, but per surgery, will not need I&D.  Warfarin resumed 2/27.   Warf PTA dosage: 7.5mg  Th, Sat; 5mg  other days--Last dose 2/25 prior to admission.  INR is therapeutic this am - has received warfarin 7.5mg  x3 doses so far this admission.  HL is subtherapeutic. (0.22) - discussed with Dr. Malachi Bonds, ok to d/c IV heparin now.  Platelets wnl  No bleeding reported in chart notes.  Goal of Therapy:  Heparin level 0.3-0.7 units/ml Monitor platelets by anticoagulation protocol: Yes   Plan:  Warfarin 7.5mg  po x 1 tonight.   F/U daily INR  Stop IV heparin, d/c daily heparin levels and CBCs  Darrol Angel, PharmD Pager: [phone] 09/10/2012 8:16 AM

## 2012-09-10 NOTE — Progress Notes (Signed)
ANTIBIOTIC CONSULT NOTE - Follow Up  Pharmacy Consult for Vancomycin and Zosyn Indication: Pilonidal cyst with infection and surrounding cellulitis  No Known Allergies  Patient Measurements: Height: 6' (182.9 cm) Weight: 388 lb 3.7 oz (176.1 kg) IBW/kg (Calculated) : 77.6 Adjusted Body Weight: 105 kg  Labs:  Recent Labs  09/08/12 0527 09/09/12 0540 09/10/12 0456  WBC 11.1* 9.6 11.5*  HGB 10.5* 10.2* 10.4*  PLT 232 231 249  CREATININE 1.39* 1.28 1.37*   Estimated Creatinine Clearance: 94.9 ml/min (by C-G formula based on Cr of 1.37).    Microbiology: 2/26 blood x2: NGTD  Antiobiotics:  2/26 >>Vanco>> 2/26 >>Zosyn >>   Assessment: 62 yo M admitted 09-06-12, currently on Day #5 Vanco 1500mg  IV q12h and Zosyn 3.375g IV q8h (4hr infusion) for Pilonidal cyst with infection and surrounding cellulitis. 2/26 CT showed: "no evidence of fluid collection to suggest abscess. No soft tissue gas collections." Of note, pt has a hx of uncontrolled DM2 and obesity (wt = 175kg, BMI=52). Vanco trough this am = 16.9. Will continue current regimen.  Goal of Therapy:  Vancomycin trough level 15-20 mcg/ml  Plan:   Continue current Vanco and Zosyn regimen  F/U antibiotics plans  Darrol Angel, PharmD Pager: [phone] 09/10/2012,9:47 AM

## 2012-09-13 LAB — CULTURE, BLOOD (ROUTINE X 2): Culture: NO GROWTH

## 2012-10-09 ENCOUNTER — Other Ambulatory Visit: Payer: Self-pay | Admitting: Cardiology

## 2012-10-12 ENCOUNTER — Ambulatory Visit (INDEPENDENT_AMBULATORY_CARE_PROVIDER_SITE_OTHER): Payer: Medicare Other | Admitting: Pharmacist

## 2012-10-12 DIAGNOSIS — I4891 Unspecified atrial fibrillation: Secondary | ICD-10-CM

## 2012-10-12 DIAGNOSIS — Z7901 Long term (current) use of anticoagulants: Secondary | ICD-10-CM

## 2012-10-17 ENCOUNTER — Encounter: Payer: Medicare Other | Admitting: Cardiology

## 2012-10-17 NOTE — Progress Notes (Signed)
HPI: Pleasant gentleman that I have seen for atrial fibrillation and CAD. Transesophageal echocardiogram in November of 2010 revealed normal LV function, moderate left atrial enlargement, mild right atrial enlargement, possible left atrial appendage thrombus and mild mitral regurgitation. Note a TSH was low but free T3 and free T4 were normal. He has been followed by endocrinology for this. We scheduled a cardioversion which was performed on June 18, 2009. The procedure was successful. Patient had PCI of his right coronary artery in August of 2012 in [place]. He had a drug-eluting stent. Last echocardiogram in February of 2013 showed normal LV function. The left atrium was mild to moderately dilated. ABIs in April of 2013 were unremarkable. Patient recently admitted and treated for perirectal cellulitis. Since he was last seen,    Current Outpatient Prescriptions  Medication Sig Dispense Refill  . clindamycin (CLEOCIN) 300 MG capsule Take 2 capsules (600 mg total) by mouth 3 (three) times daily.  40 capsule  0  . docusate sodium 100 MG CAPS Take 100 mg by mouth 2 (two) times daily.  60 capsule  0  . furosemide (LASIX) 40 MG tablet TAKE 1 TABLET BY MOUTH TWICE A DAY  60 tablet  5  . gabapentin (NEURONTIN) 300 MG capsule Take 2 capsules (600 mg total) by mouth 2 (two) times daily.  120 capsule  0  . insulin aspart (NOVOLOG) 100 UNIT/ML injection Inject 40-300 Units into the skin 3 (three) times daily before meals. Sliding scale.      . insulin glargine (LANTUS) 100 UNIT/ML injection Inject 30-70 Units into the skin 2 (two) times daily. Dispense FLEXPEN.  Inject 30 units in the morning and 70 units at night.  30 mL  1  . lisinopril-hydrochlorothiazide (PRINZIDE,ZESTORETIC) 20-12.5 MG per tablet Take 1 tablet by mouth daily.       . metaxalone (SKELAXIN) 800 MG tablet Take 800 mg by mouth daily.      . metoprolol tartrate (LOPRESSOR) 12.5 mg TABS Take 0.5 tablets (12.5 mg total) by mouth 2  (two) times daily.  30 tablet  0  . Multiple Vitamin (MULTIVITAMIN WITH MINERALS) TABS Take 1 tablet by mouth daily.      . OxyCODONE (OXYCONTIN) 20 mg T12A Take 1 tablet (20 mg total) by mouth every 12 (twelve) hours.  60 tablet  0  . oxycodone (ROXICODONE) 30 MG immediate release tablet Take 1 tablet (30 mg total) by mouth every 4 (four) hours as needed for pain. Pain  180 tablet  0  . potassium chloride SA (K-DUR,KLOR-CON) 20 MEQ tablet Take 20 mEq by mouth 2 (two) times daily.       . potassium chloride SA (K-DUR,KLOR-CON) 20 MEQ tablet Take 20 mEq by mouth 2 (two) times daily.      . rosuvastatin (CRESTOR) 10 MG tablet Take 10 mg by mouth every evening.      . senna (SENOKOT) 8.6 MG TABS Take 2 tablets (17.2 mg total) by mouth at bedtime as needed.  120 each  0  . spironolactone (ALDACTONE) 25 MG tablet Take 1 tablet (25 mg total) by mouth daily.  30 tablet  0  . warfarin (COUMADIN) 5 MG tablet Take 1.5 tablets (7.5 mg total) by mouth daily. Take 7.5 mg once each day on Thursday and Saturday; all other days take 5 mg once daily.  45 tablet  0  . warfarin (COUMADIN) 5 MG tablet TAKE AS DIRECTED  45 tablet  1   No current facility-administered medications  for this visit.     Past Medical History  Diagnosis Date  . Diabetes mellitus   . Hypertension   . Rheumatoid arthritis   . Neuropathy   . CHF (congestive heart failure)   . History of MI (myocardial infarction)     SILENT MI IN PAST   . CAD (coronary artery disease)   . Atrial fibrillation   . Anemia   . Sleep apnea      SEVERE, NO CPAP USED, DOES NOT KNOW  WHERE SLEEP STUDY DONE    Past Surgical History  Procedure Laterality Date  . Abdominal surgery  AGE 49 MONTHS    BENIGN TUMOR REMOVED  . Debridement  foot  FEW YRS AGO    LEFT FOOT, WOUNDS ALL HEALED NOW  . Coronary angioplasty with stent placement  FEB 2013     DONE AT [facility] [place]    History   Social History  . Marital Status: Single    Spouse Name: N/A     Number of Children: Y  . Years of Education: N/A   Occupational History  . disabled truck driver    Social History Main Topics  . Smoking status: Former Smoker -- 1.00 packs/day for 20 years    Types: Cigarettes    Quit date: 07/13/1983  . Smokeless tobacco: Never Used  . Alcohol Use: No  . Drug Use: No  . Sexually Active: Not on file   Other Topics Concern  . Not on file   Social History Narrative   Pt is single.   Lives with sister.   Has children.   Was adopted.   Daily cafffiene-2 cups of coffee and 2 sodas per day          ROS: no fevers or chills, productive cough, hemoptysis, dysphasia, odynophagia, melena, hematochezia, dysuria, hematuria, rash, seizure activity, orthopnea, PND, pedal edema, claudication. Remaining systems are negative.  Physical Exam: Well-developed well-nourished in no acute distress.  Skin is warm and dry.  HEENT is normal.  Neck is supple.  Chest is clear to auscultation with normal expansion.  Cardiovascular exam is regular rate and rhythm.  Abdominal exam nontender or distended. No masses palpated. Extremities show no edema. neuro grossly intact  ECG     This encounter was created in error - please disregard.

## 2012-10-25 ENCOUNTER — Other Ambulatory Visit: Payer: Self-pay | Admitting: Ophthalmology

## 2012-10-25 NOTE — H&P (Signed)
Pre-operative History and Physical for Ophthalmic Surgery  Shahid C Caloca 10/25/2012                  Chief Complaint: Cannot see to drive, cannot read, street signs are blurred, television is foggy.  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  The patient indicated understanding our discussion and desires to proceed with planned surgery to improve their visual function.   The AScan study indicates a power of  + 19.50 diopters for emmetropia                                                                                        right eye                  An  Acrysof MA50BM PC IOL will be used  for implant   No Known Allergies   Prior to Admission medications   Medication Sig Start Date End Date Taking? Authorizing Provider  clindamycin (CLEOCIN) 300 MG capsule Take 2 capsules (600 mg total) by mouth 3 (three) times daily. 09/10/12   Mackenzie Short, MD  docusate sodium 100 MG CAPS Take 100 mg by mouth 2 (two) times daily. 09/10/12   Mackenzie Short, MD  furosemide (LASIX) 40 MG tablet TAKE 1 TABLET BY MOUTH TWICE A DAY 01/13/12   Brian S Crenshaw, MD  gabapentin (NEURONTIN) 300 MG capsule Take 2 capsules (600 mg total) by mouth 2 (two) times daily. 09/10/12   Mackenzie Short, MD  insulin aspart (NOVOLOG) 100 UNIT/ML injection Inject 40-300 Units into the skin 3 (three) times daily before meals. Sliding scale.    Historical Provider, MD  insulin glargine (LANTUS) 100 UNIT/ML injection Inject 30-70 Units  into the skin 2 (two) times daily. Dispense FLEXPEN.  Inject 30 units in the morning and 70 units at night. 09/10/12   Mackenzie Short, MD  lisinopril-hydrochlorothiazide (PRINZIDE,ZESTORETIC) 20-12.5 MG per tablet Take 1 tablet by mouth daily.     Historical Provider, MD  metaxalone (SKELAXIN) 800 MG tablet Take 800 mg by mouth daily.    Historical Provider, MD  metoprolol tartrate (LOPRESSOR) 12.5 mg TABS Take 0.5 tablets (12.5 mg total) by mouth 2 (two) times daily. 09/10/12   Mackenzie Short, MD  Multiple Vitamin (MULTIVITAMIN WITH MINERALS) TABS Take 1 tablet by mouth daily.    Historical Provider, MD  OxyCODONE (OXYCONTIN) 20 mg T12A Take 1 tablet (20 mg total) by mouth every 12 (twelve) hours. 09/10/12   Mackenzie Short, MD    oxycodone (ROXICODONE) 30 MG immediate release tablet Take 1 tablet (30 mg total) by mouth every 4 (four) hours as needed for pain. Pain 09/10/12   Mackenzie Short, MD  potassium chloride SA (K-DUR,KLOR-CON) 20 MEQ tablet Take 20 mEq by mouth 2 (two) times daily.  08/04/11 08/03/12  Brian S Crenshaw, MD  potassium chloride SA (K-DUR,KLOR-CON) 20 MEQ tablet Take 20 mEq by mouth 2 (two) times daily.    Historical Provider, MD  rosuvastatin (CRESTOR) 10 MG tablet Take 10 mg by mouth every evening. 06/18/11   Brian S Crenshaw, MD  senna (SENOKOT) 8.6 MG TABS Take 2 tablets (17.2 mg total) by mouth at bedtime as needed. 09/10/12   Mackenzie Short, MD  spironolactone (ALDACTONE) 25 MG tablet Take 1 tablet (25 mg total) by mouth daily. 09/10/12   Mackenzie Short, MD  warfarin (COUMADIN) 5 MG tablet Take 1.5 tablets (7.5 mg total) by mouth daily. Take 7.5 mg once each day on Thursday and Saturday; all other days take 5 mg once daily. 09/10/12   Mackenzie Short, MD  warfarin (COUMADIN) 5 MG tablet TAKE AS DIRECTED 10/09/12   Brian S Crenshaw, MD    Planned Procedure:                                       Phacoemulsification, Posterior Chamber Intra-ocular Lens Right Eye  There were no vitals  filed for this visit.  Pulse: 80         Temp: NE        Resp:  18     ROS: back pain, chronic  Past Medical History  Diagnosis Date  . Diabetes mellitus   . Hypertension   . Rheumatoid arthritis   . Neuropathy   . CHF (congestive heart failure)   . History of MI (myocardial infarction)     SILENT MI IN PAST   . CAD (coronary artery disease)   . Atrial fibrillation   . Anemia   . Sleep apnea      SEVERE, NO CPAP USED, DOES NOT KNOW  WHERE SLEEP STUDY DONE    Past Surgical History  Procedure Laterality Date  . Abdominal surgery  AGE 7 MONTHS    BENIGN TUMOR REMOVED  . Debridement  foot  FEW YRS AGO    LEFT FOOT, WOUNDS ALL HEALED NOW  . Coronary angioplasty with stent placement  FEB 2013     DONE AT [facility]     History   Social History  . Marital Status: Single    Spouse Name: N/A    Number of Children: Y  . Years of Education: N/A   Occupational History  . disabled truck driver    Social History Main Topics  . Smoking status: Former Smoker -- 1.00 packs/day for 20 years    Types: Cigarettes    Quit date: 07/13/1983  . Smokeless tobacco: Never Used  . Alcohol Use: No  . Drug Use: No  . Sexually Active: Not on file   Other Topics Concern  . Not on file   Social History Narrative   Pt is single.   Lives with sister.   Has children.   Was adopted.   Daily cafffiene-2 cups of coffee and 2 sodas per day           The following examination is for anesthesia clearance for minimally invasive Ophthalmic surgery. It is primarily   to document heart and lung findings and is not intended to elucidate unknown general medical conditions inclusive of abdominal masses, lung lesions, etc.   General Constitution:  Obese male who is sedentary   Alertness/Orientation:  Person, time place     yes   HEENT:  Eye Findings: as above,  Combined Cataract                   right eye  Neck: supple without masses  Chest/Lungs: clear to auscultation  Cardiac:  Normal S1 and S2 without Murmur, S3 or S4  Neuro: non-focal   Impression: Visually Significant  Combined Cataract  Planned Procedure:  Phacoemulsification, Posterior Chamber Intraocular Lens      [place], MD       

## 2012-10-26 ENCOUNTER — Ambulatory Visit (INDEPENDENT_AMBULATORY_CARE_PROVIDER_SITE_OTHER): Payer: Medicare Other | Admitting: *Deleted

## 2012-10-26 DIAGNOSIS — Z7901 Long term (current) use of anticoagulants: Secondary | ICD-10-CM

## 2012-10-26 DIAGNOSIS — I4891 Unspecified atrial fibrillation: Secondary | ICD-10-CM

## 2012-10-30 ENCOUNTER — Ambulatory Visit (INDEPENDENT_AMBULATORY_CARE_PROVIDER_SITE_OTHER): Payer: Medicare Other | Admitting: Surgery

## 2012-11-01 ENCOUNTER — Ambulatory Visit (INDEPENDENT_AMBULATORY_CARE_PROVIDER_SITE_OTHER): Payer: Medicare Other | Admitting: Surgery

## 2012-11-07 ENCOUNTER — Inpatient Hospital Stay (HOSPITAL_COMMUNITY)
Admission: RE | Admit: 2012-11-07 | Discharge: 2012-11-07 | Disposition: A | Discharge status: Home / Self Care | Payer: Medicare Other | Source: Ambulatory Visit

## 2012-11-07 NOTE — Pre-Procedure Instructions (Signed)
[name]  11/07/2012   Your procedure is scheduled on:  Wednesdady, May 7th.  Report to [facility] at 6:30 AM.  Call this number if you have problems the morning of surgery: [phone]   Remember:   Do not eat food or drink liquids after midnight.   Take these medicines the morning of surgery with A SIP OF WATER: clindamycin (CLEOCIN), gabapentin (NEURONTIN), metaxalone (SKELAXIN), metoprolol tartrate (LOPRESSOR), OxyCODONE (OXYCONTIN).  May take if needed :oxycodone (ROXICODONE)   Do not wear jewelry, make-up or nail polish.  Do not wear lotions, powders, or perfumes. You may wear deodorant.  Do not shave 48 hours prior to surgery.   Do not bring valuables to the hospital.  Contacts, dentures or bridgework may not be worn into surgery.  Leave suitcase in the car. After surgery it may be brought to your room.  For patients admitted to the hospital, checkout time is 11:00 AM the day of discharge.   Patients discharged the day of surgery will not be allowed to drive home.  Name and phone number of your driver: -    Special Instructions: Shower using CHG 2 nights before surgery and the night before surgery.  If you shower the day of surgery use CHG.  Use special wash - you have one bottle of CHG for all showers.  You should use approximately 1/3 of the bottle for each shower.   Please read over the following fact sheets that you were given: Pain Booklet, Coughing and Deep Breathing and Surgical Site Infection Prevention

## 2012-11-10 ENCOUNTER — Encounter (HOSPITAL_COMMUNITY): Payer: Self-pay

## 2012-11-10 ENCOUNTER — Ambulatory Visit (INDEPENDENT_AMBULATORY_CARE_PROVIDER_SITE_OTHER): Payer: Medicare Other | Admitting: Surgery

## 2012-11-10 ENCOUNTER — Encounter (HOSPITAL_COMMUNITY)
Admission: RE | Admit: 2012-11-10 | Discharge: 2012-11-10 | Disposition: A | Discharge status: Home / Self Care | Payer: PRIVATE HEALTH INSURANCE | Source: Ambulatory Visit | Attending: Ophthalmology | Admitting: Ophthalmology

## 2012-11-10 LAB — BASIC METABOLIC PANEL
BUN: 39 mg/dL — ABNORMAL HIGH (ref 6–23)
Calcium: 9.7 mg/dL (ref 8.4–10.5)
Creatinine, Ser: 1.6 mg/dL — ABNORMAL HIGH (ref 0.50–1.35)
GFR calc non Af Amer: 45 mL/min — ABNORMAL LOW (ref >90)
Glucose, Bld: 161 mg/dL — ABNORMAL HIGH (ref 70–99)
Sodium: 134 mEq/L — ABNORMAL LOW (ref 135–145)

## 2012-11-10 LAB — CBC
Hemoglobin: 11.6 g/dL — ABNORMAL LOW (ref 13.0–17.0)
MCH: 26.3 pg (ref 26.0–34.0)
MCHC: 34.2 g/dL (ref 30.0–36.0)

## 2012-11-10 LAB — PROTIME-INR: Prothrombin Time: 28.5 seconds — ABNORMAL HIGH (ref 11.6–15.2)

## 2012-11-10 NOTE — Progress Notes (Signed)
Nurse left patient a voicemail on listed number in EPIC inquiring about scheduled 1300 PAT appointment. Call back number left.

## 2012-11-10 NOTE — Progress Notes (Signed)
11/10/12 1340  OBSTRUCTIVE SLEEP APNEA  Have you ever been diagnosed with sleep apnea through a sleep study? Yes  If yes, do you have and use a CPAP or BPAP machine every night? 0 (Pt does not use CPAP)  Do you snore loudly (loud enough to be heard through closed doors)?  1  Do you often feel tired, fatigued, or sleepy during the daytime? 1  Has anyone observed you stop breathing during your sleep? 1  Do you have, or are you being treated for high blood pressure? 1  BMI more than 35 kg/m2? 1  Age over 3 years old? 1  Neck circumference greater than 40 cm/18 inches? 1  Gender: 1  Obstructive Sleep Apnea Score 8  Score 4 or greater  Results sent to PCP   This patient has screened at risk for sleep apnea during a pre-surgical visit using the STOP Bang tool. A score of 4 or greater is at increased risk for sleep apnea.

## 2012-11-10 NOTE — Progress Notes (Signed)
PCP is Dr. Docia Chuck and sleep apnea results were sent to office. Patient informed Nurse that he had a cardiac cath with stent placement but denied having any current cardiac or pulmonary issues. When asked about Coumadin regemin, patient stated he was informed not to stop taking Coumadin for his eye surgery. Will obtain labs. Patient see's several doctors and encounters are in [place].

## 2012-11-13 ENCOUNTER — Encounter (HOSPITAL_COMMUNITY): Payer: Self-pay

## 2012-11-13 NOTE — Progress Notes (Signed)
Anesthesia chart review: Patient is a 62 year old male scheduled for right eye cataract extraction by Dr. Clarisa Kindred on 11/15/2012. History includes morbid obesity (BMI 52), former smoker, DM2 with peripheral neuropathy, HTN, CAD, silent MI before 2010 but s/p DES RCA for "easily provoked" angina 02/2011 [facility] [place]), CHF, afib s/p successful cardioversion '10, RA, anemia, OSA not on CPAP yet (Dr. Shelle Iron), hospitalization for perirectal cellulitis (pilonidal cyst) 08/2012.    Cardiologist is Dr. [name], last visit 11/12/11 with planned next appointment scheduled for 11/21/12.  He was not having any chest pain symptoms at that time, and none documented from his PAT visit.    Echo on 08/17/11 showed:  - Left ventricle: The cavity size was normal. Systolic function was normal. The estimated ejection fraction was in the range of 55% to 60%. Wall motion was normal; there were no regional wall motion abnormalities. Doppler parameters are consistent with elevated mean left atrial filling pressure. - Left atrium: The atrium was mildly to moderately dilated.  Currently, I do not have a copy of his last cath in August 2012.  He has not had a stress test since then.  EKG on 09/06/12 showed NSR, non-specific ST/T wave abnormalities.  Overall, I think it appears stable since 11/12/11.   CXR on 01/18/12 showed: No acute or active pulmonary or pleural abnormalities are seen. Stable moderate cardiac silhouette enlargement.  Preoperative labs noted.  BUN/Cr 39/1.6 (Cr previously ~ 1.3-1.7 since 08/2011).  Glucose 161.  WBC 14.2.  PT/INR 28.5/2.86, PTT 57.  Dr. Clarisa Kindred has instructed him to continue Coumadin, so I am not planning to repeat labs pre-operatively.  Reviewed with Anesthesiologist Dr. Katrinka Blazing.  Patient will be evaluated by his assigned anesthesiologist on the day of surgery.  If no acute CV/CHF symptoms or dysrhytmias then would anticipate he could proceed with this procedure.  Velna Ochs [facility] Short Stay  Center/Anesthesiology Phone [phone] 11/13/2012 11:52 AM

## 2012-11-14 ENCOUNTER — Ambulatory Visit (INDEPENDENT_AMBULATORY_CARE_PROVIDER_SITE_OTHER): Payer: Medicare Other | Admitting: Surgery

## 2012-11-15 ENCOUNTER — Encounter (HOSPITAL_COMMUNITY): Payer: Self-pay | Admitting: Certified Registered Nurse Anesthetist

## 2012-11-15 ENCOUNTER — Encounter (HOSPITAL_COMMUNITY): Payer: Self-pay | Admitting: Vascular Surgery

## 2012-11-15 ENCOUNTER — Encounter (HOSPITAL_COMMUNITY)
Admission: RE | Disposition: A | Discharge status: Home / Self Care | Payer: Self-pay | Source: Ambulatory Visit | Attending: Ophthalmology

## 2012-11-15 ENCOUNTER — Ambulatory Visit (HOSPITAL_COMMUNITY)
Admission: RE | Admit: 2012-11-15 | Discharge: 2012-11-15 | Disposition: A | Discharge status: Home / Self Care | Payer: PRIVATE HEALTH INSURANCE | Source: Ambulatory Visit | Attending: Ophthalmology | Admitting: Ophthalmology

## 2012-11-15 ENCOUNTER — Ambulatory Visit (HOSPITAL_COMMUNITY): Payer: PRIVATE HEALTH INSURANCE | Admitting: Anesthesiology

## 2012-11-15 DIAGNOSIS — I10 Essential (primary) hypertension: Secondary | ICD-10-CM | POA: Insufficient documentation

## 2012-11-15 DIAGNOSIS — Z79899 Other long term (current) drug therapy: Secondary | ICD-10-CM | POA: Insufficient documentation

## 2012-11-15 DIAGNOSIS — Z7901 Long term (current) use of anticoagulants: Secondary | ICD-10-CM | POA: Insufficient documentation

## 2012-11-15 DIAGNOSIS — M069 Rheumatoid arthritis, unspecified: Secondary | ICD-10-CM | POA: Insufficient documentation

## 2012-11-15 DIAGNOSIS — H2510 Age-related nuclear cataract, unspecified eye: Secondary | ICD-10-CM | POA: Insufficient documentation

## 2012-11-15 DIAGNOSIS — G4730 Sleep apnea, unspecified: Secondary | ICD-10-CM | POA: Insufficient documentation

## 2012-11-15 DIAGNOSIS — H25049 Posterior subcapsular polar age-related cataract, unspecified eye: Secondary | ICD-10-CM | POA: Insufficient documentation

## 2012-11-15 DIAGNOSIS — D649 Anemia, unspecified: Secondary | ICD-10-CM | POA: Insufficient documentation

## 2012-11-15 DIAGNOSIS — Z794 Long term (current) use of insulin: Secondary | ICD-10-CM | POA: Insufficient documentation

## 2012-11-15 DIAGNOSIS — E11319 Type 2 diabetes mellitus with unspecified diabetic retinopathy without macular edema: Secondary | ICD-10-CM | POA: Insufficient documentation

## 2012-11-15 DIAGNOSIS — I4891 Unspecified atrial fibrillation: Secondary | ICD-10-CM | POA: Insufficient documentation

## 2012-11-15 DIAGNOSIS — I252 Old myocardial infarction: Secondary | ICD-10-CM | POA: Insufficient documentation

## 2012-11-15 DIAGNOSIS — I509 Heart failure, unspecified: Secondary | ICD-10-CM | POA: Insufficient documentation

## 2012-11-15 DIAGNOSIS — E1139 Type 2 diabetes mellitus with other diabetic ophthalmic complication: Secondary | ICD-10-CM | POA: Insufficient documentation

## 2012-11-15 DIAGNOSIS — I2510 Atherosclerotic heart disease of native coronary artery without angina pectoris: Secondary | ICD-10-CM | POA: Insufficient documentation

## 2012-11-15 DIAGNOSIS — Z87891 Personal history of nicotine dependence: Secondary | ICD-10-CM | POA: Insufficient documentation

## 2012-11-15 HISTORY — PX: CATARACT EXTRACTION W/PHACO: SHX586

## 2012-11-15 LAB — GLUCOSE, CAPILLARY: Glucose-Capillary: 124 mg/dL — ABNORMAL HIGH (ref 70–99)

## 2012-11-15 SURGERY — PHACOEMULSIFICATION, CATARACT, WITH IOL INSERTION
Anesthesia: Monitor Anesthesia Care | Site: Eye | Laterality: Right | Wound class: Clean

## 2012-11-15 MED ORDER — LIDOCAINE HCL 2 % IJ SOLN
INTRAMUSCULAR | Status: DC | PRN
  Administered 2012-11-15: 09:00:00 via RETROBULBAR

## 2012-11-15 MED ORDER — ACETAZOLAMIDE SODIUM 500 MG IJ SOLR
INTRAMUSCULAR | Status: AC
  Filled 2012-11-15: qty 500

## 2012-11-15 MED ORDER — LIDOCAINE HCL 2 % IJ SOLN
INTRAMUSCULAR | Status: AC
  Filled 2012-11-15: qty 20

## 2012-11-15 MED ORDER — PHENYLEPHRINE HCL 2.5 % OP SOLN
OPHTHALMIC | Status: AC
  Filled 2012-11-15: qty 3

## 2012-11-15 MED ORDER — EPINEPHRINE HCL 1 MG/ML IJ SOLN
INTRAMUSCULAR | Status: AC
  Filled 2012-11-15: qty 1

## 2012-11-15 MED ORDER — TRIAMCINOLONE ACETONIDE 40 MG/ML IJ SUSP
INTRAMUSCULAR | Status: AC
  Filled 2012-11-15: qty 5

## 2012-11-15 MED ORDER — FENTANYL CITRATE 0.05 MG/ML IJ SOLN
INTRAMUSCULAR | Status: DC | PRN
  Administered 2012-11-15 (×4): 25 ug via INTRAVENOUS

## 2012-11-15 MED ORDER — 0.9 % SODIUM CHLORIDE (POUR BTL) OPTIME
TOPICAL | Status: DC | PRN
  Administered 2012-11-15: 1000 mL

## 2012-11-15 MED ORDER — BACITRACIN-POLYMYXIN B 500-10000 UNIT/GM OP OINT
TOPICAL_OINTMENT | OPHTHALMIC | Status: AC
  Filled 2012-11-15: qty 3.5

## 2012-11-15 MED ORDER — BUPIVACAINE HCL (PF) 0.75 % IJ SOLN
INTRAMUSCULAR | Status: AC
  Filled 2012-11-15: qty 10

## 2012-11-15 MED ORDER — LACTATED RINGERS IV SOLN
INTRAVENOUS | Status: DC | PRN
  Administered 2012-11-15: 09:00:00 via INTRAVENOUS

## 2012-11-15 MED ORDER — TETRACAINE HCL 0.5 % OP SOLN
OPHTHALMIC | Status: AC
  Filled 2012-11-15: qty 2

## 2012-11-15 MED ORDER — PREDNISOLONE ACETATE 1 % OP SUSP
1.0000 [drp] | OPHTHALMIC | Status: AC
  Administered 2012-11-15: 1 [drp] via OPHTHALMIC

## 2012-11-15 MED ORDER — HYPROMELLOSE (GONIOSCOPIC) 2.5 % OP SOLN
OPHTHALMIC | Status: AC
  Filled 2012-11-15: qty 15

## 2012-11-15 MED ORDER — BSS IO SOLN
INTRAOCULAR | Status: DC | PRN
  Administered 2012-11-15: 09:00:00

## 2012-11-15 MED ORDER — CEFAZOLIN SUBCONJUNCTIVAL INJECTION 100 MG/0.5 ML
100.0000 mg | INJECTION | SUBCONJUNCTIVAL | Status: DC
  Filled 2012-11-15: qty 0.5

## 2012-11-15 MED ORDER — PREDNISOLONE ACETATE 1 % OP SUSP
OPHTHALMIC | Status: AC
  Filled 2012-11-15: qty 5

## 2012-11-15 MED ORDER — WATER FOR IRRIGATION, STERILE IR SOLN
Status: DC | PRN
  Administered 2012-11-15: 1000 mL via TOPICAL

## 2012-11-15 MED ORDER — NA CHONDROIT SULF-NA HYALURON 40-30 MG/ML IO SOLN
INTRAOCULAR | Status: DC | PRN
  Administered 2012-11-15: 0.5 mL via INTRAOCULAR

## 2012-11-15 MED ORDER — CEFAZOLIN SUBCONJUNCTIVAL INJECTION 100 MG/0.5 ML
INJECTION | SUBCONJUNCTIVAL | Status: DC | PRN
  Administered 2012-11-15: 100 mg via SUBCONJUNCTIVAL

## 2012-11-15 MED ORDER — TETRACAINE HCL 0.5 % OP SOLN
2.0000 [drp] | OPHTHALMIC | Status: AC
  Administered 2012-11-15: 2 [drp] via OPHTHALMIC

## 2012-11-15 MED ORDER — PROPOFOL 10 MG/ML IV BOLUS
INTRAVENOUS | Status: DC | PRN
  Administered 2012-11-15 (×4): 20 mg via INTRAVENOUS

## 2012-11-15 MED ORDER — GATIFLOXACIN 0.5 % OP SOLN
OPHTHALMIC | Status: AC
  Filled 2012-11-15: qty 2.5

## 2012-11-15 MED ORDER — MIDAZOLAM HCL 5 MG/5ML IJ SOLN
INTRAMUSCULAR | Status: DC | PRN
  Administered 2012-11-15 (×2): .5 mg via INTRAVENOUS
  Administered 2012-11-15: 1 mg via INTRAVENOUS

## 2012-11-15 MED ORDER — GATIFLOXACIN 0.5 % OP SOLN
1.0000 [drp] | OPHTHALMIC | Status: AC | PRN
  Administered 2012-11-15 (×3): 1 [drp] via OPHTHALMIC

## 2012-11-15 MED ORDER — NA CHONDROIT SULF-NA HYALURON 40-30 MG/ML IO SOLN
INTRAOCULAR | Status: AC
  Filled 2012-11-15: qty 0.5

## 2012-11-15 MED ORDER — DEXAMETHASONE SODIUM PHOSPHATE 10 MG/ML IJ SOLN
INTRAMUSCULAR | Status: DC | PRN
  Administered 2012-11-15: 10 mg

## 2012-11-15 MED ORDER — PHENYLEPHRINE HCL 2.5 % OP SOLN
1.0000 [drp] | OPHTHALMIC | Status: AC | PRN
  Administered 2012-11-15 (×3): 1 [drp] via OPHTHALMIC

## 2012-11-15 MED ORDER — HYPROMELLOSE (GONIOSCOPIC) 2.5 % OP SOLN
OPHTHALMIC | Status: DC | PRN
  Administered 2012-11-15: 2 [drp] via OPHTHALMIC

## 2012-11-15 MED ORDER — BSS IO SOLN
INTRAOCULAR | Status: AC
  Filled 2012-11-15: qty 500

## 2012-11-15 MED ORDER — BALANCED SALT IO SOLN
INTRAOCULAR | Status: DC | PRN
  Administered 2012-11-15: 15 mL via INTRAOCULAR

## 2012-11-15 MED ORDER — SODIUM HYALURONATE 10 MG/ML IO SOLN
INTRAOCULAR | Status: AC
  Filled 2012-11-15: qty 0.85

## 2012-11-15 MED ORDER — DEXAMETHASONE SODIUM PHOSPHATE 10 MG/ML IJ SOLN
INTRAMUSCULAR | Status: AC
  Filled 2012-11-15: qty 1

## 2012-11-15 SURGICAL SUPPLY — 39 items
APPLICATOR COTTON TIP 6IN STRL (MISCELLANEOUS) ×2 IMPLANT
APPLICATOR DR MATTHEWS STRL (MISCELLANEOUS) ×2 IMPLANT
BAG MINI COLL DRAIN (WOUND CARE) ×2 IMPLANT
BLADE KERATOME 2.75 (BLADE) ×2 IMPLANT
CLOTH BEACON ORANGE TIMEOUT ST (SAFETY) ×2 IMPLANT
DRAPE OPHTHALMIC 77X100 STRL (CUSTOM PROCEDURE TRAY) ×2 IMPLANT
DRAPE POUCH INSTRU U-SHP 10X18 (DRAPES) ×2 IMPLANT
DRSG TEGADERM 4X4.75 (GAUZE/BANDAGES/DRESSINGS) ×2 IMPLANT
GLOVE SS BIOGEL STRL SZ 6.5 (GLOVE) ×1 IMPLANT
GLOVE SUPERSENSE BIOGEL SZ 6.5 (GLOVE) ×1
GLOVE SURG SS PI 8.0 STRL IVOR (GLOVE) ×2 IMPLANT
GOWN STRL NON-REIN LRG LVL3 (GOWN DISPOSABLE) ×2 IMPLANT
GOWN STRL REIN 2XL XLG LVL4 (GOWN DISPOSABLE) ×2 IMPLANT
KIT BASIN OR (CUSTOM PROCEDURE TRAY) ×2 IMPLANT
KIT ROOM TURNOVER OR (KITS) ×2 IMPLANT
KNIFE GRIESHABER SHARP 2.5MM (MISCELLANEOUS) ×2 IMPLANT
LENS IOL ACRYSOF MP POST 19.5 (Intraocular Lens) ×2 IMPLANT
MASK EYE SHIELD (GAUZE/BANDAGES/DRESSINGS) ×2 IMPLANT
NEEDLE 18GX1X1/2 (RX/OR ONLY) (NEEDLE) ×2 IMPLANT
NEEDLE 22X1 1/2 (OR ONLY) (NEEDLE) ×2 IMPLANT
NEEDLE 25GX 5/8IN NON SAFETY (NEEDLE) ×2 IMPLANT
NEEDLE FILTER BLUNT 18X 1/2SAF (NEEDLE) ×1
NEEDLE FILTER BLUNT 18X1 1/2 (NEEDLE) ×1 IMPLANT
NEEDLE HYPO 30X.5 LL (NEEDLE) ×4 IMPLANT
NS IRRIG 1000ML POUR BTL (IV SOLUTION) ×2 IMPLANT
PACK CATARACT CUSTOM (CUSTOM PROCEDURE TRAY) ×2 IMPLANT
PACK CATARACT MCHSCP (PACKS) ×2 IMPLANT
PAD ARMBOARD 7.5X6 YLW CONV (MISCELLANEOUS) ×4 IMPLANT
PAD EYE OVAL STERILE LF (GAUZE/BANDAGES/DRESSINGS) ×2 IMPLANT
RING MALYGIN (MISCELLANEOUS) IMPLANT
SHUTTLE MONARCH TYPE A (NEEDLE) ×2 IMPLANT
SOLUTION ANTI FOG 6CC (MISCELLANEOUS) ×2 IMPLANT
SPEAR EYE SURG WECK-CEL (MISCELLANEOUS) ×2 IMPLANT
TAPE PAPER MEDFIX 1IN X 10YD (GAUZE/BANDAGES/DRESSINGS) ×2 IMPLANT
TIP ABS 45DEG FLARED 0.9MM (TIP) ×2 IMPLANT
TOWEL OR 17X24 6PK STRL BLUE (TOWEL DISPOSABLE) ×4 IMPLANT
WATER STERILE IRR 1000ML POUR (IV SOLUTION) ×2 IMPLANT
WIPE INSTRUMENT ADHESIVE BACK (MISCELLANEOUS) ×2 IMPLANT
WIPE INSTRUMENT VISIWIPE 73X73 (MISCELLANEOUS) ×2 IMPLANT

## 2012-11-15 NOTE — Transfer of Care (Signed)
Immediate Anesthesia Transfer of Care Note  Patient: [name]  Procedure(s) Performed: Procedure(s): CATARACT EXTRACTION PHACO AND INTRAOCULAR LENS PLACEMENT (IOC) (Right)  Patient Location: Short Stay  Anesthesia Type:MAC  Level of Consciousness: awake, alert , oriented and patient cooperative  Airway & Oxygen Therapy: Patient Spontanous Breathing  Post-op Assessment: Report given to PACU RN, Post -op Vital signs reviewed and stable and Patient moving all extremities  Post vital signs: Reviewed and stable  Complications: No apparent anesthesia complications

## 2012-11-15 NOTE — Anesthesia Postprocedure Evaluation (Signed)
Anesthesia Post Note  Patient: [name]  Procedure(s) Performed: Procedure(s) (LRB): CATARACT EXTRACTION PHACO AND INTRAOCULAR LENS PLACEMENT (IOC) (Right)  Anesthesia type: MAC  Patient location: PACU  Post pain: Pain level controlled  Post assessment: Patient's Cardiovascular Status Stable  Last Vitals:  Filed Vitals:   11/15/12 0947  BP: 130/52  Pulse: 82  Temp: 36.7 C  Resp: 20    Post vital signs: Reviewed and stable  Level of consciousness: sedated  Complications: No apparent anesthesia complications

## 2012-11-15 NOTE — Interval H&P Note (Signed)
History and Physical Interval Note:  11/15/2012 8:28 AM  [name]  has presented today for surgery, with the diagnosis of Combined Catraact Right Eye  The various methods of treatment have been discussed with the patient and family. After consideration of risks, benefits and other options for treatment, the patient has consented to  Procedure(s): CATARACT EXTRACTION PHACO AND INTRAOCULAR LENS PLACEMENT (IOC) (Right) as a surgical intervention .  The patient's history has been reviewed, patient examined, no change in status, stable for surgery.  I have reviewed the patient's chart and labs.  Questions were answered to the patient's satisfaction.     Carmell Elgin, Waynette Buttery

## 2012-11-15 NOTE — Anesthesia Procedure Notes (Signed)
Procedure Name: MAC Date/Time: 11/15/2012 8:46 AM Performed by: Angelica Pou Pre-anesthesia Checklist: Patient identified, Timeout performed, Emergency Drugs available, Suction available and Patient being monitored Patient Re-evaluated:Patient Re-evaluated prior to inductionOxygen Delivery Method: Nasal cannula Preoxygenation: Pre-oxygenation with 100% oxygen Intubation Type: IV induction

## 2012-11-15 NOTE — Op Note (Signed)
[name] 11/15/2012 Cataract: Combined, Nuclear  Procedure: Phacoemulsification, Posterior Chamber Intra-ocular Lens Operative Eye:  right eye  Surgeon: Shade Flood Estimated Blood Loss: minimal Specimens for Pathology:  None Complications: none  The patient was prepared and draped in the usual manner for ocular surgery on the right eye. A Cook lid speculum was placed. A peripheral clear corneal incision was made at the surgical limbus centered at the 11:00 meridian. A separate clear corneal stab incision was made with a 15 degree blade at the 2:00 meridian to permit bi-manual technique. Viscoat and  Provisc as an underlying layer next to the capsule was instilled into the anterior chamber through that incision.  A keratome was used to create a self sealing incision entering the anterior chamber at the 11:00 meridian. A capsulorhexis was performed using a bent 25g needle. The lens was hydrodissected and the nucleus was hydrodilineated using a Nichammin cannula. The Chang chopper was inserted and used to rotate the lens to insure adequate lens mobility. The phacoemulsification handpiece was inserted and a combined phaco-chop technique was employed, fracturing the lens into separate sections with subsequent removal with the phaco handpiece.   The I/A cannula was used to remove remaining lens cortex. Provisc was instilled and used to deepen the anterior chamber and posterior capsule bag. The Monarch injector was used to place a folded Acrysof MA50BM PC IOL, + 19.50  diopters, into the capsule bag. A Sinskey lens hook was used to dial in the trailing haptic.  The I/A cannula was used to remove the viscoelastic from the anterior chamber. BSS was used to bring IOP to the desired range and the wound was checked to insure it was watertight. Subconjunctival injections of Ancef 100/0.59ml and Dexamethasone 0.5 ml of a 10mg /78ml solution were placed without complication. The lid speculum and drapes were  removed and the patient's eye was patched with Polymixin/Bacitracin ophthalmic ointment. An eye shield was placed and the patient was transferred alert and conversant from the operating room to the post-operative recovery area.   Shade Flood, MD

## 2012-11-15 NOTE — Anesthesia Preprocedure Evaluation (Addendum)
Anesthesia Evaluation  Patient identified by MRN, date of birth, ID band Patient awake    Reviewed: Allergy & Precautions, H&P , NPO status , Patient's Chart, lab work & pertinent test results  Airway Mallampati: III TM Distance: >3 FB Neck ROM: Full    Dental  (+) Poor Dentition, Caps and Dental Advisory Given   Pulmonary shortness of breath and with exertion, sleep apnea ,    Pulmonary exam normal       Cardiovascular hypertension, Pt. on medications + CAD, + Past MI and +CHF + dysrhythmias Atrial Fibrillation     Neuro/Psych negative neurological ROS  negative psych ROS   GI/Hepatic negative GI ROS, Neg liver ROS,   Endo/Other  diabetes, Insulin Dependent  Renal/GU Renal InsufficiencyRenal disease     Musculoskeletal   Abdominal   Peds  Hematology   Anesthesia Other Findings   Reproductive/Obstetrics                          Anesthesia Physical Anesthesia Plan  ASA: III  Anesthesia Plan: MAC   Post-op Pain Management:    Induction: Intravenous  Airway Management Planned: Simple Face Mask  Additional Equipment:   Intra-op Plan:   Post-operative Plan:   Informed Consent: I have reviewed the patients History and Physical, chart, labs and discussed the procedure including the risks, benefits and alternatives for the proposed anesthesia with the patient or authorized representative who has indicated his/her understanding and acceptance.   Dental advisory given  Plan Discussed with: CRNA, Anesthesiologist and Surgeon  Anesthesia Plan Comments:        Anesthesia Quick Evaluation

## 2012-11-15 NOTE — H&P (View-Only) (Signed)
Pre-operative History and Physical for Ophthalmic Surgery  [name] 10/25/2012                  Chief Complaint: Cannot see to drive, cannot read, street signs are blurred, television is foggy.  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  The patient indicated understanding our discussion and desires to proceed with planned surgery to improve their visual function.   The AScan study indicates a power of  + 19.50 diopters for emmetropia                                                                                        right eye                  An  Acrysof MA50BM PC IOL will be used  for implant   No Known Allergies   Prior to Admission medications   Medication Sig Start Date End Date Taking? Authorizing Provider  clindamycin (CLEOCIN) 300 MG capsule Take 2 capsules (600 mg total) by mouth 3 (three) times daily. 09/10/12   Renae Fickle, MD  docusate sodium 100 MG CAPS Take 100 mg by mouth 2 (two) times daily. 09/10/12   Renae Fickle, MD  furosemide (LASIX) 40 MG tablet TAKE 1 TABLET BY MOUTH TWICE A DAY 01/13/12   Lewayne Bunting, MD  gabapentin (NEURONTIN) 300 MG capsule Take 2 capsules (600 mg total) by mouth 2 (two) times daily. 09/10/12   Renae Fickle, MD  insulin aspart (NOVOLOG) 100 UNIT/ML injection Inject 40-300 Units into the skin 3 (three) times daily before meals. Sliding scale.    Historical Provider, MD  insulin glargine (LANTUS) 100 UNIT/ML injection Inject 30-70 Units  into the skin 2 (two) times daily. Dispense FLEXPEN.  Inject 30 units in the morning and 70 units at night. 09/10/12   Renae Fickle, MD  lisinopril-hydrochlorothiazide (PRINZIDE,ZESTORETIC) 20-12.5 MG per tablet Take 1 tablet by mouth daily.     Historical Provider, MD  metaxalone (SKELAXIN) 800 MG tablet Take 800 mg by mouth daily.    Historical Provider, MD  metoprolol tartrate (LOPRESSOR) 12.5 mg TABS Take 0.5 tablets (12.5 mg total) by mouth 2 (two) times daily. 09/10/12   Renae Fickle, MD  Multiple Vitamin (MULTIVITAMIN WITH MINERALS) TABS Take 1 tablet by mouth daily.    Historical Provider, MD  OxyCODONE (OXYCONTIN) 20 mg T12A Take 1 tablet (20 mg total) by mouth every 12 (twelve) hours. 09/10/12   Renae Fickle, MD  oxycodone (ROXICODONE) 30 MG immediate release tablet Take 1 tablet (30 mg total) by mouth every 4 (four) hours as needed for pain. Pain 09/10/12   Renae Fickle, MD  potassium chloride SA (K-DUR,KLOR-CON) 20 MEQ tablet Take 20 mEq by mouth 2 (two) times daily.  08/04/11 08/03/12  Lewayne Bunting, MD  potassium chloride SA (K-DUR,KLOR-CON) 20 MEQ tablet Take 20 mEq by mouth 2 (two) times daily.    Historical Provider, MD  rosuvastatin (CRESTOR) 10 MG tablet Take 10 mg by mouth every evening. 06/18/11   Lewayne Bunting, MD  senna (SENOKOT) 8.6 MG TABS Take 2 tablets (17.2 mg total) by mouth at bedtime as needed. 09/10/12   Renae Fickle, MD  spironolactone (ALDACTONE) 25 MG tablet Take 1 tablet (25 mg total) by mouth daily. 09/10/12   Renae Fickle, MD  warfarin (COUMADIN) 5 MG tablet Take 1.5 tablets (7.5 mg total) by mouth daily. Take 7.5 mg once each day on Thursday and Saturday; all other days take 5 mg once daily. 09/10/12   Renae Fickle, MD  warfarin (COUMADIN) 5 MG tablet TAKE AS DIRECTED 10/09/12   Lewayne Bunting, MD    Planned Procedure:                                       Phacoemulsification, Posterior Chamber Intra-ocular Lens Right Eye  There were no vitals  filed for this visit.  Pulse: 80         Temp: NE        Resp:  18     ROS: back pain, chronic  Past Medical History  Diagnosis Date  . Diabetes mellitus   . Hypertension   . Rheumatoid arthritis   . Neuropathy   . CHF (congestive heart failure)   . History of MI (myocardial infarction)     SILENT MI IN PAST   . CAD (coronary artery disease)   . Atrial fibrillation   . Anemia   . Sleep apnea      SEVERE, NO CPAP USED, DOES NOT KNOW  WHERE SLEEP STUDY DONE    Past Surgical History  Procedure Laterality Date  . Abdominal surgery  AGE 24 MONTHS    BENIGN TUMOR REMOVED  . Debridement  foot  FEW YRS AGO    LEFT FOOT, WOUNDS ALL HEALED NOW  . Coronary angioplasty with stent placement  FEB 2013     DONE AT [facility] [place]     History   Social History  . Marital Status: Single    Spouse Name: N/A    Number of Children: Y  . Years of Education: N/A   Occupational History  . disabled truck driver    Social History Main Topics  . Smoking status: Former Smoker -- 1.00 packs/day for 20 years    Types: Cigarettes    Quit date: 07/13/1983  . Smokeless tobacco: Never Used  . Alcohol Use: No  . Drug Use: No  . Sexually Active: Not on file   Other Topics Concern  . Not on file   Social History Narrative   Pt is single.   Lives with sister.   Has children.   Was adopted.   Daily cafffiene-2 cups of coffee and 2 sodas per day           The following examination is for anesthesia clearance for minimally invasive Ophthalmic surgery. It is primarily  to document heart and lung findings and is not intended to elucidate unknown general medical conditions inclusive of abdominal masses, lung lesions, etc.   General Constitution:  Obese male who is sedentary   Alertness/Orientation:  Person, time place     yes   HEENT:  Eye Findings: as above,  Combined Cataract                   right eye  Neck: supple without masses  Chest/Lungs: clear to auscultation  Cardiac:  Normal S1 and S2 without Murmur, S3 or S4  Neuro: non-focal   Impression: Visually Significant  Combined Cataract  Planned Procedure:  Phacoemulsification, Posterior Chamber Intraocular Lens     Shade Flood, MD

## 2012-11-15 NOTE — Preoperative (Signed)
Beta Blockers   Reason not to administer Beta Blockers:Not Applicable, took metoprolol noc 11/14/12 per usual regimen.

## 2012-11-16 ENCOUNTER — Other Ambulatory Visit: Payer: Self-pay | Admitting: Ophthalmology

## 2012-11-16 ENCOUNTER — Encounter (HOSPITAL_COMMUNITY): Payer: Self-pay | Admitting: Ophthalmology

## 2012-11-17 NOTE — H&P (Signed)
 Crass, [name] Patient Number:  [id number] Date of Birth:  October 22, 1950 Age:  62 years old    Gender:  Male Date of Evaluation:  Nov 16, 2012  Chief Complaint:   The patient is POD 1 post Phaco IOL OD. He has no overnight complaints. He reports not being able to drive or read. He cannot read his phone. He cannot see a stop sign or a stop light. TV is blurred. His vision is like looking through a haze. He expresses the desire to proceed with Cataract surgery OS. History of Present Illness:   61 yo male diabetic [name]/o blurred vision.  Has difficulty for distance and near.  CHO not well controlled.  Recently hospitalized.  A1C 10.  No p ain or discomfort.  No burning or itching.  Has tearing ou.  Lids do not mat together in the AM. Past History:  Allergies:  nkda, Active Medications:   Other Medications:  Novolog 100 units TID, Lantus 70 units daily, Lisinopril daily, Crestor q hs, Metoprolol daily, Amiodarone daily, Glyburide daily, Metformin daily, Ocuvite daily, Multivitamin daily Birth History:  none Past Ocular History:  none Past Medical History:   Hypertension, 250.01 Diabetes mellitus, insulin dependeb\nt, Elevated Cholesterol Past Surgical History:   Left foot surgery Family History:  no amblyopia, no blindness, no cataracts, no crossed eyes, no diabetic retinopathy, no glaucoma, no macular degeneration, no retinal detachment, no cancer, no diabetes, no heart disease, no high blood pressure, no stroke Family history not known. Examination:  Visual Acuity:   Distance [facility] [place]:  OD: 20/50 ph NI    OS: 20/60 IOP:  OD:  29    @ 05:17PM (Goldmann applanation) Manifest Refraction:    Sphere    Cyl Axis       VA         Add       VA Prism Base L:  +1.75  -1.00   60    20/50       +2.75                        Pupils:  OU:  Shape, size, direct and consensual reaction normal  Adnexa:  Preauricular LN, lacrimal drainage, lacrimal glands, orbit normal  Eyelids: Eyelids:  normal Conjunctiva:    OD:  injected OS:  bulbar, palpebral normal  Cornea:  OU:  epithelium, stroma, endothelium, tear film normal  Anterior Chamber:  OD:  3+ deep, trace flare, rare cells OS:  depth normal, no cell, no flare  Iris:  OU:  normal, rubeosis absent Dilation:  OU: AK-Dilate, Tropicacyl @ 11:36AM  Lens:  OD:  PC IOL in good position OS:  2+ posterior subcapsular cataract, 3+ nuclear sclerotic cataract, Hazy fundus view related to PSC Cataract  Vitreous:  OU:  normal  Optic Disc:  OD:  cupping: 0.55 V x 0.5 H OS:  cupping: 0.65 V x 0.6 H  Macula:  OU:  mild BDR, few MAs and blot heme  Vessels:  OU:  mild BDR  Periphery:  OD:  hazy view OS:  hazy view. OU:  Details not clear   Impression:  v45.61  Pseudophakia OD -  -post Phaco IOL OD x 11/15/2012 366.19  Combined Cataract OS -  -consistent with rapid viison loss and decreased fundus view OS 250.50  Diabetes, type II, with ophthalmic manifestations (not stated as controlled) 362.04  Non-proliferative Diabetic Retinopathy, mild  377.14  Cupping of Optic Disc   OU: normal IOP  Plan/Treatment:  He is doing well POD 1 post Phaco IOL OD. He desires to schedule for Cataract surgery for his left eye.   He indicated understanding our discussion and felt that his questions had been answered to his satisfaction.  He agrees with the treatment plan and specifically desires to have surgery to help his vision.   Medications:   Zymaxid 1 gtt operative eye QID  Prednisolone Acetatae1% 1 gtt operative eye QID  Patient Instructions: Call for any increased redness, discharge , eye pain or decreased vision. Cal for increased floaters, any changes in peripheral vision. Return to clinic:  1  week/ sooner PRN symptoms for post-operative follow-up OD. He desires to schedule to have Cataract Extraction in his left eye. His AScan calculation for emmetropia with Acrysof MA50BM PC IOL :    +19.50 for the left eye (electronically signed)  Kemari Mares,  MD  

## 2012-11-21 ENCOUNTER — Ambulatory Visit (INDEPENDENT_AMBULATORY_CARE_PROVIDER_SITE_OTHER): Payer: PRIVATE HEALTH INSURANCE | Admitting: Cardiology

## 2012-11-21 ENCOUNTER — Ambulatory Visit (INDEPENDENT_AMBULATORY_CARE_PROVIDER_SITE_OTHER): Payer: PRIVATE HEALTH INSURANCE | Admitting: *Deleted

## 2012-11-21 ENCOUNTER — Encounter: Payer: Self-pay | Admitting: Cardiology

## 2012-11-21 VITALS — BP 146/54 | HR 85 | Ht 72.0 in | Wt 386.0 lb

## 2012-11-21 DIAGNOSIS — Z7901 Long term (current) use of anticoagulants: Secondary | ICD-10-CM

## 2012-11-21 DIAGNOSIS — I10 Essential (primary) hypertension: Secondary | ICD-10-CM

## 2012-11-21 DIAGNOSIS — I5040 Unspecified combined systolic (congestive) and diastolic (congestive) heart failure: Secondary | ICD-10-CM

## 2012-11-21 DIAGNOSIS — I4891 Unspecified atrial fibrillation: Secondary | ICD-10-CM

## 2012-11-21 DIAGNOSIS — I2510 Atherosclerotic heart disease of native coronary artery without angina pectoris: Secondary | ICD-10-CM

## 2012-11-21 DIAGNOSIS — E785 Hyperlipidemia, unspecified: Secondary | ICD-10-CM

## 2012-11-21 LAB — POCT INR: INR: 2.4

## 2012-11-21 LAB — BASIC METABOLIC PANEL
BUN: 19 mg/dL (ref 6–23)
CO2: 25 mEq/L (ref 19–32)
Calcium: 9.1 mg/dL (ref 8.4–10.5)
Creatinine, Ser: 1.4 mg/dL (ref 0.4–1.5)

## 2012-11-21 NOTE — Progress Notes (Signed)
HPI: Pleasant gentleman that I have seen for atrial fibrillation and CAD. Transesophageal echocardiogram in November of 2010 revealed normal LV function, moderate left atrial enlargement, mild right atrial enlargement, possible left atrial appendage thrombus and mild mitral regurgitation. Note a TSH was low but free T3 and free T4 were normal. He has been followed by endocrinology for this. We scheduled a cardioversion which was performed on June 18, 2009. The procedure was successful. Patient had PCI of his right coronary artery in August of 2012 in [place]. He had a drug-eluting stent. Last echocardiogram in February of 2013 showed normal LV function. The left atrium was mild to moderately dilated. ABIs in April of 2013 were unremarkable. Since I last saw him in May 2013, he does have some dyspnea on exertion but no orthopnea, PND, chest pain or syncope. Mild pedal edema.   Current Outpatient Prescriptions  Medication Sig Dispense Refill  . exenatide (BYETTA) 5 MCG/0.02ML SOPN Inject 5 mcg into the skin 2 (two) times daily with a meal.      . furosemide (LASIX) 40 MG tablet Take 40 mg by mouth 2 (two) times daily.      [name] gabapentin (NEURONTIN) 300 MG capsule Take 600 mg by mouth 2 (two) times daily.      . insulin aspart (NOVOLOG FLEXPEN) 100 unit/mL SOLN FlexPen Inject 50 Units into the skin 2 (two) times daily with a meal.      . insulin glargine (LANTUS) 100 UNIT/ML injection Inject 100 Units into the skin 2 (two) times daily.      [name] lisinopril-hydrochlorothiazide (PRINZIDE,ZESTORETIC) 20-12.5 MG per tablet Take 1 tablet by mouth daily.       . metaxalone (SKELAXIN) 800 MG tablet Take 800 mg by mouth daily.      . metFORMIN (GLUCOPHAGE) 500 MG tablet Take 500 mg by mouth 2 (two) times daily with a meal.      . metoprolol tartrate (LOPRESSOR) 12.5 mg TABS Take 0.5 tablets (12.5 mg total) by mouth 2 (two) times daily.  30 tablet  0  . Multiple Vitamin (MULTIVITAMIN WITH MINERALS) TABS  Take 1 tablet by mouth daily.      [name] oxycodone (ROXICODONE) 30 MG immediate release tablet Take 1 tablet (30 mg total) by mouth every 4 (four) hours as needed for pain. Pain  180 tablet  0  . potassium chloride SA (K-DUR,KLOR-CON) 20 MEQ tablet Take 20 mEq by mouth 2 (two) times daily.      . rosuvastatin (CRESTOR) 10 MG tablet Take 10 mg by mouth every evening.      [name] spironolactone (ALDACTONE) 25 MG tablet Take 1 tablet (25 mg total) by mouth daily.  30 tablet  0  . warfarin (COUMADIN) 5 MG tablet Take 5-7.5 mg by mouth daily. 5mg  every day except on Thursday and Sunday take 7.5mg        No current facility-administered medications for this visit.     Past Medical History  Diagnosis Date  . Diabetes mellitus   . Hypertension   . Rheumatoid arthritis   . Neuropathy   . CHF (congestive heart failure)   . History of MI (myocardial infarction)     SILENT MI IN PAST   . CAD (coronary artery disease)   . Atrial fibrillation   . Anemia   . Sleep apnea      SEVERE, NO CPAP USED, DOES NOT KNOW  WHERE SLEEP STUDY DONE  . Myocardial infarction     pt stated he  had a slient heart attack that showed up on nuclear stress.  . Shortness of breath     with ambulation    Past Surgical History  Procedure Laterality Date  . Abdominal surgery  AGE 70 MONTHS    BENIGN TUMOR REMOVED  . Debridement  foot  FEW YRS AGO    LEFT FOOT, WOUNDS ALL HEALED NOW  . Coronary angioplasty with stent placement  August 2012    RCA DES - [facility]  . Cataract extraction w/phaco Right 11/15/2012    Procedure: CATARACT EXTRACTION PHACO AND INTRAOCULAR LENS PLACEMENT (IOC);  Surgeon: Shade Flood, MD;  Location: [facility] OR;  Service: Ophthalmology;  Laterality: Right;    History   Social History  . Marital Status: Single    Spouse Name: N/A    Number of Children: Y  . Years of Education: N/A   Occupational History  . disabled truck driver    Social History Main Topics  . Smoking  status: Former Smoker -- 1.00 packs/day for 20 years    Types: Cigarettes    Quit date: 07/13/1983  . Smokeless tobacco: Never Used  . Alcohol Use: No  . Drug Use: No  . Sexually Active: Not on file   Other Topics Concern  . Not on file   Social History Narrative   Pt is single.   Lives with sister.   Has children.   Was adopted.   Daily cafffiene-2 cups of coffee and 2 sodas per day          ROS: no fevers or chills, productive cough, hemoptysis, dysphasia, odynophagia, melena, hematochezia, dysuria, hematuria, rash, seizure activity, orthopnea, PND, pedal edema, claudication. Remaining systems are negative.  Physical Exam: Well-developed obese in no acute distress.  Skin is warm and dry.  HEENT is normal.  Neck is supple.  Chest is clear to auscultation with normal expansion.  Cardiovascular exam is regular rate and rhythm.  Abdominal exam nontender or distended. No masses palpated. Extremities show no edema. neuro grossly intact  ECG sinus rhythm with PVCs. Low voltage. No ST changes.

## 2012-11-21 NOTE — Assessment & Plan Note (Signed)
Patient remains in sinus rhythm. He discontinued his amiodarone on his own. We will follow him and see if he hold sinus rhythm. Continue Coumadin. Embolic risk factors of hypertension and diabetes.

## 2012-11-21 NOTE — Assessment & Plan Note (Signed)
Continue statin. Lipid and liver monitored by primary care.

## 2012-11-21 NOTE — Patient Instructions (Addendum)
Your physician wants you to follow-up in: 6 MONTHS WITH DR CRENSHAW You will receive a reminder letter in the mail two months in advance. If you don't receive a letter, please call our office to schedule the follow-up appointment.   Your physician recommends that you HAVE LAB WORK TODAY 

## 2012-11-21 NOTE — Assessment & Plan Note (Signed)
Patient needs weight loss. He also has been diagnosed with sleep apnea previously but does not use CPAP. I asked him to followup with pulmonary for this issue. He also needs to follow up with gastroenterology as he still has not had endoscopy/colonoscopy.

## 2012-11-21 NOTE — Assessment & Plan Note (Signed)
Continue diuretics. Check potassium and renal function.

## 2012-11-21 NOTE — Assessment & Plan Note (Addendum)
Continue Coumadin. Not on aspirin given need for Coumadin. Continue statin.

## 2012-11-21 NOTE — Assessment & Plan Note (Signed)
Continue present blood pressure medications. 

## 2012-11-22 ENCOUNTER — Telehealth: Payer: Self-pay | Admitting: *Deleted

## 2012-11-22 DIAGNOSIS — R790 Abnormal level of blood mineral: Secondary | ICD-10-CM

## 2012-11-22 MED ORDER — MAGNESIUM OXIDE 400 MG PO TABS: 400.0000 mg | ORAL_TABLET | Freq: Every day | ORAL | Status: DC

## 2012-11-22 NOTE — Telephone Encounter (Signed)
Magnesium oxide 400 mg po daily Mg in 2weeks [name]. Left message of results for pt, script sent to pharm

## 2012-11-28 MED ORDER — GATIFLOXACIN 0.5 % OP SOLN: 1.0000 [drp] | OPHTHALMIC | Status: DC | PRN

## 2012-11-28 MED ORDER — OXYCODONE HCL 5 MG/5ML PO SOLN: 5.0000 mg | Freq: Once | ORAL | Status: DC | PRN

## 2012-11-28 MED ORDER — HYDROMORPHONE HCL PF 1 MG/ML IJ SOLN: 0.2500 mg | INTRAMUSCULAR | Status: DC | PRN

## 2012-11-28 MED ORDER — TETRACAINE HCL 0.5 % OP SOLN: 2.0000 [drp] | OPHTHALMIC | Status: DC

## 2012-11-28 MED ORDER — PROMETHAZINE HCL 25 MG/ML IJ SOLN: 6.2500 mg | INTRAMUSCULAR | Status: DC | PRN

## 2012-11-28 MED ORDER — OXYCODONE HCL 5 MG PO TABS: 5.0000 mg | ORAL_TABLET | Freq: Once | ORAL | Status: DC | PRN

## 2012-11-28 MED ORDER — PHENYLEPHRINE HCL 2.5 % OP SOLN: 1.0000 [drp] | OPHTHALMIC | Status: DC | PRN

## 2012-11-28 MED ORDER — PREDNISOLONE ACETATE 1 % OP SUSP: 1.0000 [drp] | OPHTHALMIC | Status: DC

## 2012-11-28 NOTE — Progress Notes (Signed)
Pt [place] still taking Coumadin, "I didn't stop it last time."

## 2012-11-29 ENCOUNTER — Encounter (HOSPITAL_COMMUNITY)
Admission: RE | Disposition: A | Discharge status: Home / Self Care | Payer: Self-pay | Source: Ambulatory Visit | Attending: Ophthalmology

## 2012-11-29 ENCOUNTER — Ambulatory Visit (HOSPITAL_COMMUNITY)
Admission: RE | Admit: 2012-11-29 | Discharge: 2012-11-29 | Disposition: A | Discharge status: Home / Self Care | Payer: Medicare Other | Source: Ambulatory Visit | Attending: Ophthalmology | Admitting: Ophthalmology

## 2012-11-29 ENCOUNTER — Encounter (HOSPITAL_COMMUNITY): Payer: Self-pay | Admitting: Anesthesiology

## 2012-11-29 ENCOUNTER — Ambulatory Visit (HOSPITAL_COMMUNITY): Payer: Medicare Other | Admitting: Anesthesiology

## 2012-11-29 ENCOUNTER — Encounter (HOSPITAL_COMMUNITY): Payer: Self-pay | Admitting: *Deleted

## 2012-11-29 ENCOUNTER — Other Ambulatory Visit: Payer: Self-pay | Admitting: Ophthalmology

## 2012-11-29 DIAGNOSIS — Z6841 Body Mass Index (BMI) 40.0 and over, adult: Secondary | ICD-10-CM | POA: Insufficient documentation

## 2012-11-29 DIAGNOSIS — Z794 Long term (current) use of insulin: Secondary | ICD-10-CM | POA: Insufficient documentation

## 2012-11-29 DIAGNOSIS — IMO0002 Reserved for concepts with insufficient information to code with codable children: Secondary | ICD-10-CM | POA: Insufficient documentation

## 2012-11-29 DIAGNOSIS — N289 Disorder of kidney and ureter, unspecified: Secondary | ICD-10-CM | POA: Insufficient documentation

## 2012-11-29 DIAGNOSIS — S0560XA Penetrating wound without foreign body of unspecified eyeball, initial encounter: Secondary | ICD-10-CM | POA: Insufficient documentation

## 2012-11-29 DIAGNOSIS — E11329 Type 2 diabetes mellitus with mild nonproliferative diabetic retinopathy without macular edema: Secondary | ICD-10-CM | POA: Insufficient documentation

## 2012-11-29 DIAGNOSIS — E1139 Type 2 diabetes mellitus with other diabetic ophthalmic complication: Secondary | ICD-10-CM | POA: Insufficient documentation

## 2012-11-29 DIAGNOSIS — I10 Essential (primary) hypertension: Secondary | ICD-10-CM | POA: Insufficient documentation

## 2012-11-29 DIAGNOSIS — H25049 Posterior subcapsular polar age-related cataract, unspecified eye: Secondary | ICD-10-CM | POA: Insufficient documentation

## 2012-11-29 DIAGNOSIS — H47239 Glaucomatous optic atrophy, unspecified eye: Secondary | ICD-10-CM | POA: Insufficient documentation

## 2012-11-29 DIAGNOSIS — E7800 Pure hypercholesterolemia, unspecified: Secondary | ICD-10-CM | POA: Insufficient documentation

## 2012-11-29 DIAGNOSIS — Z79899 Other long term (current) drug therapy: Secondary | ICD-10-CM | POA: Insufficient documentation

## 2012-11-29 DIAGNOSIS — Z9849 Cataract extraction status, unspecified eye: Secondary | ICD-10-CM | POA: Insufficient documentation

## 2012-11-29 DIAGNOSIS — Y9210 Unspecified residential institution as the place of occurrence of the external cause: Secondary | ICD-10-CM | POA: Insufficient documentation

## 2012-11-29 DIAGNOSIS — H2510 Age-related nuclear cataract, unspecified eye: Secondary | ICD-10-CM | POA: Insufficient documentation

## 2012-11-29 DIAGNOSIS — Z961 Presence of intraocular lens: Secondary | ICD-10-CM | POA: Insufficient documentation

## 2012-11-29 HISTORY — PX: CATARACT EXTRACTION W/PHACO: SHX586

## 2012-11-29 LAB — PROTIME-INR
INR: 1.82 — ABNORMAL HIGH (ref 0.00–1.49)
Prothrombin Time: 20.4 seconds — ABNORMAL HIGH (ref 11.6–15.2)

## 2012-11-29 LAB — CBC
Hemoglobin: 10.9 g/dL — ABNORMAL LOW (ref 13.0–17.0)
MCH: 26.9 pg (ref 26.0–34.0)
Platelets: 282 10*3/uL (ref 150–400)
RBC: 4.05 MIL/uL — ABNORMAL LOW (ref 4.22–5.81)

## 2012-11-29 LAB — GLUCOSE, CAPILLARY: Glucose-Capillary: 108 mg/dL — ABNORMAL HIGH (ref 70–99)

## 2012-11-29 LAB — APTT: aPTT: 50 seconds — ABNORMAL HIGH (ref 24–37)

## 2012-11-29 SURGERY — PHACOEMULSIFICATION, CATARACT, WITH IOL INSERTION
Anesthesia: Monitor Anesthesia Care | Site: Eye | Laterality: Left | Wound class: Clean

## 2012-11-29 MED ORDER — BACITRACIN-POLYMYXIN B 500-10000 UNIT/GM OP OINT
TOPICAL_OINTMENT | OPHTHALMIC | Status: AC
  Filled 2012-11-29: qty 3.5

## 2012-11-29 MED ORDER — PROPOFOL 10 MG/ML IV BOLUS
INTRAVENOUS | Status: DC | PRN
  Administered 2012-11-29: 30 mg via INTRAVENOUS
  Administered 2012-11-29: 10 mg via INTRAVENOUS

## 2012-11-29 MED ORDER — MIDAZOLAM HCL 5 MG/5ML IJ SOLN
INTRAMUSCULAR | Status: DC | PRN
  Administered 2012-11-29: 2 mg via INTRAVENOUS

## 2012-11-29 MED ORDER — PHENYLEPHRINE HCL 2.5 % OP SOLN
OPHTHALMIC | Status: AC
  Filled 2012-11-29: qty 3

## 2012-11-29 MED ORDER — LIDOCAINE HCL 2 % IJ SOLN
INTRAMUSCULAR | Status: AC
  Filled 2012-11-29: qty 20

## 2012-11-29 MED ORDER — ACETAZOLAMIDE SODIUM 500 MG IJ SOLR
INTRAMUSCULAR | Status: AC
  Filled 2012-11-29: qty 500

## 2012-11-29 MED ORDER — EPINEPHRINE HCL 1 MG/ML IJ SOLN
INTRAOCULAR | Status: DC | PRN
  Administered 2012-11-29: 16:00:00

## 2012-11-29 MED ORDER — HYPROMELLOSE (GONIOSCOPIC) 2.5 % OP SOLN
OPHTHALMIC | Status: AC
  Filled 2012-11-29: qty 15

## 2012-11-29 MED ORDER — NA CHONDROIT SULF-NA HYALURON 40-30 MG/ML IO SOLN
INTRAOCULAR | Status: DC | PRN
  Administered 2012-11-29: 0.5 mL via INTRAOCULAR

## 2012-11-29 MED ORDER — TETRACAINE HCL 0.5 % OP SOLN
2.0000 [drp] | OPHTHALMIC | Status: AC
  Administered 2012-11-29: 2 [drp] via OPHTHALMIC

## 2012-11-29 MED ORDER — TETRACAINE HCL 0.5 % OP SOLN
OPHTHALMIC | Status: AC
  Filled 2012-11-29: qty 2

## 2012-11-29 MED ORDER — PHENYLEPHRINE HCL 2.5 % OP SOLN
1.0000 [drp] | OPHTHALMIC | Status: AC | PRN
  Administered 2012-11-29 (×3): 1 [drp] via OPHTHALMIC

## 2012-11-29 MED ORDER — EPINEPHRINE HCL 1 MG/ML IJ SOLN
INTRAMUSCULAR | Status: AC
  Filled 2012-11-29: qty 1

## 2012-11-29 MED ORDER — HYPROMELLOSE (GONIOSCOPIC) 2.5 % OP SOLN
OPHTHALMIC | Status: DC | PRN
  Administered 2012-11-29: 2 [drp] via OPHTHALMIC

## 2012-11-29 MED ORDER — 0.9 % SODIUM CHLORIDE (POUR BTL) OPTIME
TOPICAL | Status: DC | PRN
  Administered 2012-11-29: 1000 mL

## 2012-11-29 MED ORDER — TRIAMCINOLONE ACETONIDE 40 MG/ML IJ SUSP
INTRAMUSCULAR | Status: AC
  Filled 2012-11-29: qty 5

## 2012-11-29 MED ORDER — NA CHONDROIT SULF-NA HYALURON 40-30 MG/ML IO SOLN
INTRAOCULAR | Status: AC
  Filled 2012-11-29: qty 0.5

## 2012-11-29 MED ORDER — DEXAMETHASONE SODIUM PHOSPHATE 10 MG/ML IJ SOLN
INTRAMUSCULAR | Status: AC
  Filled 2012-11-29: qty 1

## 2012-11-29 MED ORDER — BUPIVACAINE HCL (PF) 0.75 % IJ SOLN
INTRAMUSCULAR | Status: DC | PRN
  Administered 2012-11-29: 15:00:00 via RETROBULBAR

## 2012-11-29 MED ORDER — GATIFLOXACIN 0.5 % OP SOLN
1.0000 [drp] | OPHTHALMIC | Status: AC | PRN
  Administered 2012-11-29 (×3): 1 [drp] via OPHTHALMIC

## 2012-11-29 MED ORDER — SODIUM HYALURONATE 10 MG/ML IO SOLN
INTRAOCULAR | Status: DC | PRN
  Administered 2012-11-29: 0.85 mL via INTRAOCULAR

## 2012-11-29 MED ORDER — GATIFLOXACIN 0.5 % OP SOLN
OPHTHALMIC | Status: AC
  Filled 2012-11-29: qty 2.5

## 2012-11-29 MED ORDER — BSS IO SOLN
INTRAOCULAR | Status: AC
  Filled 2012-11-29: qty 500

## 2012-11-29 MED ORDER — PREDNISOLONE ACETATE 1 % OP SUSP
OPHTHALMIC | Status: AC
  Filled 2012-11-29: qty 5

## 2012-11-29 MED ORDER — CEFAZOLIN SUBCONJUNCTIVAL INJECTION 100 MG/0.5 ML
100.0000 mg | INJECTION | SUBCONJUNCTIVAL | Status: DC
  Filled 2012-11-29: qty 0.5

## 2012-11-29 MED ORDER — SODIUM CHLORIDE 0.9 % IV SOLN
INTRAVENOUS | Status: DC
  Administered 2012-11-29: 45 mL/h via INTRAVENOUS
  Administered 2012-11-29 (×2): via INTRAVENOUS

## 2012-11-29 MED ORDER — DEXAMETHASONE SODIUM PHOSPHATE 10 MG/ML IJ SOLN
INTRAMUSCULAR | Status: DC | PRN
  Administered 2012-11-29: 10 mg

## 2012-11-29 MED ORDER — BACITRACIN-POLYMYXIN B 500-10000 UNIT/GM OP OINT
TOPICAL_OINTMENT | OPHTHALMIC | Status: DC | PRN
  Administered 2012-11-29: 1 via OPHTHALMIC

## 2012-11-29 MED ORDER — BUPIVACAINE HCL (PF) 0.75 % IJ SOLN
INTRAMUSCULAR | Status: AC
  Filled 2012-11-29: qty 10

## 2012-11-29 MED ORDER — PREDNISOLONE ACETATE 1 % OP SUSP
1.0000 [drp] | OPHTHALMIC | Status: AC
  Administered 2012-11-29: 1 [drp] via OPHTHALMIC

## 2012-11-29 MED ORDER — CEFAZOLIN SUBCONJUNCTIVAL INJECTION 100 MG/0.5 ML
200.0000 mg | INJECTION | SUBCONJUNCTIVAL | Status: DC
  Filled 2012-11-29 (×2): qty 1

## 2012-11-29 SURGICAL SUPPLY — 40 items
APPLICATOR COTTON TIP 6IN STRL (MISCELLANEOUS) ×2 IMPLANT
APPLICATOR DR MATTHEWS STRL (MISCELLANEOUS) ×2 IMPLANT
BLADE KERATOME 2.75 (BLADE) ×2 IMPLANT
CLOTH BEACON ORANGE TIMEOUT ST (SAFETY) ×2 IMPLANT
DRAPE OPHTHALMIC 77X100 STRL (CUSTOM PROCEDURE TRAY) ×2 IMPLANT
DRAPE POUCH INSTRU U-SHP 10X18 (DRAPES) ×2 IMPLANT
DRSG TEGADERM 4X4.75 (GAUZE/BANDAGES/DRESSINGS) ×2 IMPLANT
GLOVE BIO SURGEON STRL SZ8 (GLOVE) ×2 IMPLANT
GLOVE SS BIOGEL STRL SZ 6.5 (GLOVE) ×1 IMPLANT
GLOVE SUPERSENSE BIOGEL SZ 6.5 (GLOVE) ×1
GLOVE SURG SS PI 6.5 STRL IVOR (GLOVE) ×2 IMPLANT
GOWN STRL NON-REIN LRG LVL3 (GOWN DISPOSABLE) ×2 IMPLANT
KIT BASIN OR (CUSTOM PROCEDURE TRAY) ×2 IMPLANT
KIT ROOM TURNOVER OR (KITS) ×2 IMPLANT
KNIFE GRIESHABER SHARP 2.5MM (MISCELLANEOUS) ×2 IMPLANT
LENS IOL ACRYSOF MP POST 19.5 (Intraocular Lens) ×2 IMPLANT
MASK EYE SHIELD (GAUZE/BANDAGES/DRESSINGS) ×2 IMPLANT
NEEDLE 18GX1X1/2 (RX/OR ONLY) (NEEDLE) ×4 IMPLANT
NEEDLE 21X1 OR PACK (NEEDLE) ×2 IMPLANT
NEEDLE 22X1 1/2 (OR ONLY) (NEEDLE) ×2 IMPLANT
NEEDLE 25GX 5/8IN NON SAFETY (NEEDLE) ×2 IMPLANT
NEEDLE 27GX1/2 REG BEVEL ECLIP (NEEDLE) ×2 IMPLANT
NEEDLE FILTER BLUNT 18X 1/2SAF (NEEDLE) ×1
NEEDLE FILTER BLUNT 18X1 1/2 (NEEDLE) ×1 IMPLANT
NEEDLE HYPO 30X.5 LL (NEEDLE) ×6 IMPLANT
NS IRRIG 1000ML POUR BTL (IV SOLUTION) ×2 IMPLANT
PACK CATARACT CUSTOM (CUSTOM PROCEDURE TRAY) ×2 IMPLANT
PACK CATARACT MCHSCP (PACKS) ×2 IMPLANT
PAD ARMBOARD 7.5X6 YLW CONV (MISCELLANEOUS) ×2 IMPLANT
PAD EYE OVAL STERILE LF (GAUZE/BANDAGES/DRESSINGS) ×2 IMPLANT
PROBE ANTERIOR 20G W/INFUS NDL (MISCELLANEOUS) ×2 IMPLANT
SHUTTLE MONARCH TYPE A (NEEDLE) ×2 IMPLANT
SPEAR EYE SURG WECK-CEL (MISCELLANEOUS) ×2 IMPLANT
SYR TB 1ML LUER SLIP (SYRINGE) ×2 IMPLANT
TAPE PAPER MEDFIX 1IN X 10YD (GAUZE/BANDAGES/DRESSINGS) ×2 IMPLANT
TIP ABS 45DEG FLARED 0.9MM (TIP) ×2 IMPLANT
TOWEL OR 17X24 6PK STRL BLUE (TOWEL DISPOSABLE) ×2 IMPLANT
WATER STERILE IRR 1000ML POUR (IV SOLUTION) ×2 IMPLANT
WIPE INSTRUMENT ADHESIVE BACK (MISCELLANEOUS) ×2 IMPLANT
WIPE INSTRUMENT VISIWIPE 73X73 (MISCELLANEOUS) ×2 IMPLANT

## 2012-11-29 NOTE — H&P (View-Only) (Signed)
TILLMON, KISLING Patient Number:  [id number] Date of Birth:  [birth year] Age:  62 years old    Gender:  Male Date of Evaluation:  Nov 16, 2012  Chief Complaint:   The patient is POD 1 post Phaco IOL OD. He has no overnight complaints. He reports not being able to drive or read. He cannot read his phone. He cannot see a stop sign or a stop light. TV is blurred. His vision is like looking through a haze. He expresses the desire to proceed with Cataract surgery OS. History of Present Illness:   62 yo male diabetic c/o blurred vision.  Has difficulty for distance and near.  CHO not well controlled.  Recently hospitalized.  A1C 10.  No p ain or discomfort.  No burning or itching.  Has tearing ou.  Lids do not mat together in the AM. Past History:  Allergies:  nkda, Active Medications:   Other Medications:  Novolog 100 units TID, Lantus 70 units daily, Lisinopril daily, Crestor q hs, Metoprolol daily, Amiodarone daily, Glyburide daily, Metformin daily, Ocuvite daily, Multivitamin daily Birth History:  none Past Ocular History:  none Past Medical History:   Hypertension, 250.01 Diabetes mellitus, insulin dependeb\nt, Elevated Cholesterol Past Surgical History:   Left foot surgery Family History:  no amblyopia, no blindness, no cataracts, no crossed eyes, no diabetic retinopathy, no glaucoma, no macular degeneration, no retinal detachment, no cancer, no diabetes, no heart disease, no high blood pressure, no stroke Family history not known. Examination:  Visual Acuity:   Distance [facility] [place]:  OD: 20/50 ph NI    OS: 20/60 IOP:  OD:  29    @ 05:17PM (Goldmann applanation) Manifest Refraction:    Sphere    Cyl Axis       VA         Add       VA Prism Base L:  +1.75  -1.00   60    20/50       +2.75                        Pupils:  OU:  Shape, size, direct and consensual reaction normal  Adnexa:  Preauricular LN, lacrimal drainage, lacrimal glands, orbit normal  Eyelids: Eyelids:  normal Conjunctiva:    OD:  injected OS:  bulbar, palpebral normal  Cornea:  OU:  epithelium, stroma, endothelium, tear film normal  Anterior Chamber:  OD:  3+ deep, trace flare, rare cells OS:  depth normal, no cell, no flare  Iris:  OU:  normal, rubeosis absent Dilation:  OU: AK-Dilate, Tropicacyl @ 11:36AM  Lens:  OD:  PC IOL in good position OS:  2+ posterior subcapsular cataract, 3+ nuclear sclerotic cataract, Hazy fundus view related to [facility] Cataract  Vitreous:  OU:  normal  Optic Disc:  OD:  cupping: 0.55 V x 0.5 H OS:  cupping: 0.65 V x 0.6 H  Macula:  OU:  mild BDR, few MAs and blot heme  Vessels:  OU:  mild BDR  Periphery:  OD:  hazy view OS:  hazy view. OU:  Details not clear   Impression:  v45.61  Pseudophakia OD -  -post Phaco IOL OD x 11/15/2012 366.19  Combined Cataract OS -  -consistent with rapid viison loss and decreased fundus view OS 250.50  Diabetes, type II, with ophthalmic manifestations (not stated as controlled) 362.04  Non-proliferative Diabetic Retinopathy, mild  377.14  Cupping of Optic Disc  OU: normal IOP  Plan/Treatment:  He is doing well POD 1 post Phaco IOL OD. He desires to schedule for Cataract surgery for his left eye.   He indicated understanding our discussion and felt that his questions had been answered to his satisfaction.  He agrees with the treatment plan and specifically desires to have surgery to help his vision.   Medications:   Zymaxid 1 gtt operative eye QID  Prednisolone Acetatae1% 1 gtt operative eye QID  Patient Instructions: Call for any increased redness, discharge , eye pain or decreased vision. Cal for increased floaters, any changes in peripheral vision. Return to clinic:  1  week/ sooner PRN symptoms for post-operative follow-up OD. He desires to schedule to have Cataract Extraction in his left eye. His AScan calculation for emmetropia with Acrysof [id number] PC IOL :    +19.50 for the left eye (electronically signed)  Shade Flood,  MD

## 2012-11-29 NOTE — Progress Notes (Signed)
Dr. Clarisa Kindred notified that eye drop orders are needed.

## 2012-11-29 NOTE — Preoperative (Signed)
Beta Blockers   Reason not to administer Beta Blockers:Not Applicable 

## 2012-11-29 NOTE — Anesthesia Preprocedure Evaluation (Addendum)
Anesthesia Evaluation  Patient identified by MRN, date of birth, ID band Patient awake    Reviewed: Allergy & Precautions, H&P , NPO status , Patient's Chart, lab work & pertinent test results  Airway Mallampati: III TM Distance: >3 FB Neck ROM: Full    Dental   Pulmonary shortness of breath, sleep apnea ,  breath sounds clear to auscultation        Cardiovascular hypertension, + CAD and + Past MI + dysrhythmias Atrial Fibrillation Rhythm:Irregular Rate:Normal     Neuro/Psych    GI/Hepatic   Endo/Other  diabetesMorbid obesity  Renal/GU      Musculoskeletal  (+) Arthritis -,   Abdominal (+) + obese,   Peds  Hematology   Anesthesia Other Findings   Reproductive/Obstetrics                          Anesthesia Physical Anesthesia Plan  ASA: III  Anesthesia Plan: MAC   Post-op Pain Management:    Induction: Intravenous  Airway Management Planned: Nasal Cannula  Additional Equipment:   Intra-op Plan:   Post-operative Plan:   Informed Consent: I have reviewed the patients History and Physical, chart, labs and discussed the procedure including the risks, benefits and alternatives for the proposed anesthesia with the patient or authorized representative who has indicated his/her understanding and acceptance.     Plan Discussed with: CRNA and Surgeon  Anesthesia Plan Comments:         Anesthesia Quick Evaluation

## 2012-11-29 NOTE — Interval H&P Note (Signed)
History and Physical Interval Note:  11/29/2012 2:54 PM  [name]  has presented today for surgery, with the diagnosis of Combined Cataract Left Eye  The various methods of treatment have been discussed with the patient and family. After consideration of risks, benefits and other options for treatment, the patient has consented to  Procedure(s): CATARACT EXTRACTION PHACO AND INTRAOCULAR LENS PLACEMENT (IOC) (Left) as a surgical intervention .  The patient's history has been reviewed, patient examined, no change in status, stable for surgery.  I have reviewed the patient's chart and labs.  Questions were answered to the patient's satisfaction.     Nikolaus Pienta, Waynette Buttery

## 2012-11-29 NOTE — Op Note (Signed)
[name] 11/29/2012 Cataract: Combined, Nuclear  Procedure: Phacoemulsification, Posterior Chamber Intra-ocular Lens Operative Eye:  left eye  Surgeon: Shade Flood Estimated Blood Loss: minimal Specimens for Pathology:  None Complications: posterior capsule tear during cortex removal  The patient was prepared and draped in the usual manner for ocular surgery on the left eye. A Cook lid speculum was placed. A peripheral clear corneal incision was made at the surgical limbus centered at the 11:00 meridian. A separate clear corneal stab incision was made with a 15 degree blade at the 2:00 meridian to permit bi-manual technique. Viscoat and  Provisc as an underlying layer next to the capsule was instilled into the anterior chamber through that incision.  A keratome was used to create a self sealing incision entering the anterior chamber at the 11:00 meridian. A capsulorhexis was performed using a bent 25g needle. The lens was hydrodissected and the nucleus was hydrodilineated using a Nichammin cannula. The Chang chopper was inserted and used to rotate the lens to insure adequate lens mobility. The phacoemulsification handpiece was inserted and a combined phaco-chop technique was employed, fracturing the lens into separate sections with subsequent removal with the phaco handpiece. During removal of the last remnant of cortical shell the posterior capsule flopped anteriorly into the anterior chamber where the Phaco handpiece was held and the capsule touched the tip of the phaco causing a round hole. The anterior vitrectomy handpiece was used to remove the anterior vitreous through the opening and I elected to complete remaining cortex removal using the vitrectomy handpiece. A 22g needle on the infusion to maintain globe volume during that maneuver. No lens fragments were in the vitreous and no vitreous was in the anterior chamber at conclusion.  Provisc was instilled and used to deepen the anterior  chamber.  The Monarch injector was used to place a folded Acrysof MA50BM PC IOL, + 19.50  diopters, into the sulcus. A McPherson forcep was used to place the trailing haptic into the sulcus.   The I/A cannula was used to remove the viscoelastic from the anterior chamber. BSS was used to bring IOP to the desired range and the wound was checked to insure it was watertight. Subconjunctival injections of Ancef 100/0.46ml and Dexamethasone 0.5 ml of a 10mg /48ml solution were placed without complication. The lid speculum and drapes were removed and the patient's eye was patched with Polymixin/Bacitracin ophthalmic ointment. An eye shield was placed and the patient was transferred alert and conversant from the operating room to the post-operative recovery area.   Shade Flood, MD

## 2012-11-29 NOTE — Transfer of Care (Signed)
Immediate Anesthesia Transfer of Care Note  Patient: [name]  Procedure(s) Performed: Procedure(s): CATARACT EXTRACTION PHACO AND INTRAOCULAR LENS PLACEMENT (IOC) (Left)  Patient Location: Short Stay  Anesthesia Type:MAC  Level of Consciousness: awake, alert  and oriented  Airway & Oxygen Therapy: Patient Spontanous Breathing  Post-op Assessment: Report given to PACU RN and Post -op Vital signs reviewed and stable  Post vital signs: Reviewed and stable  Complications: No apparent anesthesia complications

## 2012-11-30 ENCOUNTER — Encounter (HOSPITAL_COMMUNITY): Payer: Self-pay | Admitting: Ophthalmology

## 2012-12-01 NOTE — Anesthesia Postprocedure Evaluation (Signed)
  Anesthesia Post-op Note  Patient: [name]  Procedure(s) Performed: Procedure(s): CATARACT EXTRACTION PHACO AND INTRAOCULAR LENS PLACEMENT (IOC) (Left)  Patient discharged with no reported anesthetic complications

## 2012-12-05 ENCOUNTER — Other Ambulatory Visit: Payer: Self-pay | Admitting: Cardiology

## 2012-12-15 ENCOUNTER — Ambulatory Visit (INDEPENDENT_AMBULATORY_CARE_PROVIDER_SITE_OTHER): Payer: Medicaid Other | Admitting: General Surgery

## 2012-12-15 ENCOUNTER — Telehealth: Payer: Self-pay | Admitting: *Deleted

## 2012-12-15 ENCOUNTER — Encounter (INDEPENDENT_AMBULATORY_CARE_PROVIDER_SITE_OTHER): Payer: Self-pay | Admitting: General Surgery

## 2012-12-15 VITALS — BP 138/60 | HR 78 | Temp 97.3°F | Resp 14 | Ht 72.0 in | Wt 370.0 lb

## 2012-12-15 DIAGNOSIS — L0591 Pilonidal cyst without abscess: Secondary | ICD-10-CM

## 2012-12-15 NOTE — Progress Notes (Signed)
Patient ID: [name], male   DOB: 1951/07/06, 62 y.o.   MRN: [id number]  Chief Complaint  Patient presents with  . New Evaluation    eval pilonidal absc    HPI [name] is a 62 y.o. male.  The patient is a 62 year old male who was recently seen at the hospital secondary to chronic pilonidal cyst infection. The patient was at that time on Coumadin and INR greater than 1.5 and was treated conservatively. The patient states since that time to continue with his chronic pain to the  Cyst area. He states usually clear drainage coming from the area it is somewhat painful.   The patient is currently on Coumadin secondary to A. Fib. He states he's been normal sinus rhythm for a year. The patient also has had a stent placement but is not taking Plavix. Patient sees Dr. [name] manages his Coumadin for his A. Fib. HPI  Past Medical History  Diagnosis Date  . Diabetes mellitus   . Hypertension   . Rheumatoid arthritis(714.0)   . Neuropathy   . CHF (congestive heart failure)   . History of MI (myocardial infarction)     SILENT MI IN PAST   . CAD (coronary artery disease)   . Atrial fibrillation   . Anemia   . Sleep apnea      SEVERE, NO CPAP USED, DOES NOT KNOW  WHERE SLEEP STUDY DONE  . Myocardial infarction     pt stated he had a slient heart attack that showed up on nuclear stress.  . Shortness of breath     with ambulation    Past Surgical History  Procedure Laterality Date  . Abdominal surgery  AGE 89 MONTHS    BENIGN TUMOR REMOVED  . Debridement  foot  FEW YRS AGO    LEFT FOOT, WOUNDS ALL HEALED NOW  . Coronary angioplasty with stent placement  August 2012    RCA DES - [facility]  . Cataract extraction w/phaco Right 11/15/2012    Procedure: CATARACT EXTRACTION PHACO AND INTRAOCULAR LENS PLACEMENT (IOC);  Surgeon: Shade Flood, MD;  Location: [facility] OR;  Service: Ophthalmology;  Laterality: Right;  . Cataract extraction w/phaco Left 11/29/2012   Procedure: CATARACT EXTRACTION PHACO AND INTRAOCULAR LENS PLACEMENT (IOC);  Surgeon: Shade Flood, MD;  Location: [facility] OR;  Service: Ophthalmology;  Laterality: Left;    Family History  Problem Relation Age of Onset  . Other Other     PT ADOPTED    Social History History  Substance Use Topics  . Smoking status: Former Smoker -- 1.00 packs/day for 20 years    Types: Cigarettes    Quit date: 07/13/1983  . Smokeless tobacco: Never Used  . Alcohol Use: No    No Known Allergies  Current Outpatient Prescriptions  Medication Sig Dispense Refill  . ACCU-CHEK AVIVA PLUS test strip       . ACCU-CHEK FASTCLIX LANCETS MISC       . ADACEL 11-10-13.5 injection       . clindamycin (CLEOCIN) 300 MG capsule       . exenatide (BYETTA) 5 MCG/0.02ML SOPN Inject 5 mcg into the skin 2 (two) times daily with a meal.      . furosemide (LASIX) 40 MG tablet Take 40 mg by mouth 2 (two) times daily.      [name] gabapentin (NEURONTIN) 300 MG capsule Take 600 mg by mouth 2 (two) times daily.      . insulin aspart (  NOVOLOG FLEXPEN) 100 unit/mL SOLN FlexPen Inject 50 Units into the skin 2 (two) times daily with a meal.      . insulin glargine (LANTUS) 100 UNIT/ML injection Inject 100 Units into the skin 2 (two) times daily.      [name] KLOR-CON M20 20 MEQ tablet TAKE 1 TABLET BY MOUTH TWICE A DAY  60 tablet  5  . lisinopril-hydrochlorothiazide (PRINZIDE,ZESTORETIC) 20-12.5 MG per tablet Take 1 tablet by mouth daily.       . magnesium oxide (MAG-OX) 400 MG tablet Take 1 tablet (400 mg total) by mouth daily.  30 tablet  12  . metaxalone (SKELAXIN) 800 MG tablet Take 800 mg by mouth daily.      . metFORMIN (GLUCOPHAGE) 500 MG tablet Take 500 mg by mouth 2 (two) times daily with a meal.      . metoprolol tartrate (LOPRESSOR) 12.5 mg TABS Take 0.5 tablets (12.5 mg total) by mouth 2 (two) times daily.  30 tablet  0  . Multiple Vitamin (MULTIVITAMIN WITH MINERALS) TABS Take 1 tablet by mouth daily.      [name] oxycodone (ROXICODONE)  30 MG immediate release tablet Take 1 tablet (30 mg total) by mouth every 4 (four) hours as needed for pain. Pain  180 tablet  0  . potassium chloride SA (K-DUR,KLOR-CON) 20 MEQ tablet Take 20 mEq by mouth 2 (two) times daily.      . rosuvastatin (CRESTOR) 10 MG tablet Take 10 mg by mouth every evening.      [name] spironolactone (ALDACTONE) 25 MG tablet Take 1 tablet (25 mg total) by mouth daily.  30 tablet  0  . sulindac (CLINORIL) 200 MG tablet       . warfarin (COUMADIN) 5 MG tablet Take 5-7.5 mg by mouth daily. 5mg  every day except on Thursday and Sunday take 7.5mg       . ZOSTAVAX 60454 UNT/0.65ML injection        No current facility-administered medications for this visit.    Review of Systems Review of Systems  Constitutional: Negative.   HENT: Negative.   Respiratory: Negative.   Cardiovascular: Negative.   Gastrointestinal: Negative.   Neurological: Negative.   Hematological: Negative.   All other systems reviewed and are negative.    Blood pressure 138/60, pulse 78, temperature 97.3 F (36.3 C), temperature source Temporal, resp. rate 14, height 6' (1.829 m), weight 370 lb (167.831 kg).  Physical Exam Physical Exam  Constitutional: He is oriented to person, place, and time. He appears well-developed and well-nourished.  HENT:  Head: Normocephalic and atraumatic.  Eyes: Conjunctivae and EOM are normal. Pupils are equal, round, and reactive to light.  Neck: Normal range of motion. Neck supple.  Cardiovascular: Normal rate, regular rhythm and normal heart sounds.   Pulmonary/Chest: Effort normal and breath sounds normal.  Abdominal: Soft. Bowel sounds are normal. There is no tenderness. There is no rebound and no guarding.  Genitourinary: Penis normal.     A cystic, no purulence could be expressed or drainage.  Musculoskeletal: Normal range of motion.  Neurological: He is oriented to person, place, and time.    Data Reviewed none  Assessment    62 year old male  with a chronic pilonidal cyst.     Plan    1. I discussed with the patient complications of excising the pilonidal cyst as well as healing complications associated with this. Secondary to pain and chronicity of the cyst he would like to proceed with excising the pilonidal cyst.  2.We discussed the risks and benefits of the cyst excision to include infection, prolonged healing, recurrence, and possible need for further surgery. 3. Will get in touch with Dr. [name] in regards to his Coumadin to see if he can be off further surgery and potentially for one to 2 weeks postoperatively during his healing process. Once we get the okay from Dr. [name] we will schedule the patient for an excision of pilonidal cyst.        Marigene Ehlers., [facility] 12/15/2012, 11:23 AM

## 2012-12-15 NOTE — Telephone Encounter (Signed)
Received call from pt that he was to have splinter removed from heel on Monday  June 9th and was instructed by Dr Fanny Dance Podiatrist to hold coumadin for 2 days prior to surgery. This information confirmed by this nurse by calling Dr Fanny Dance office. Dr Antoine Poche informed of this pt and need to hold coumadin for 2 days prior to having splinter removal  surgery per Dr Fanny Dance and after reviewing notes he approved pt holding coumadin for 2 days . This nurse called Dr Fanny Dance and informed of this and called pt and gave  instruction to hold  Coumadin June 7th and 8th 2014 for surgery on June 9th and then when instructed by Dr Fanny Dance to restart coumadin  start at same dose and  his appt was  changed  in coumadin clinic to  June 16th and pt states understanding of the instructions.

## 2012-12-22 ENCOUNTER — Ambulatory Visit (INDEPENDENT_AMBULATORY_CARE_PROVIDER_SITE_OTHER): Payer: Medicare Other | Admitting: Surgery

## 2012-12-22 ENCOUNTER — Telehealth (INDEPENDENT_AMBULATORY_CARE_PROVIDER_SITE_OTHER): Payer: Self-pay | Admitting: General Surgery

## 2012-12-22 NOTE — Telephone Encounter (Signed)
Message copied by June Leap on Fri Dec 22, 2012  2:05 PM ------      Message from: Docia Chuck      Created: Fri Dec 22, 2012 11:22 AM      Regarding: Surgery       Mr. Leard has called and wants to know when his surgery is going to be scheduled.  Looking in his chart I see you were going to contact Dr. [name].  I advised him once you have received this information we would be calling him to schedule his surgery.            Thanks ------

## 2012-12-22 NOTE — Telephone Encounter (Signed)
Called Dr.Crenshaw's office to check the status of medical clearance, per Wynelle Beckmann and his nurse have both been on vac this week..will call and make patient aware that the doctor is not due back in the office until 12/26/12.Marland KitchenMarland [facility]Be[facility] 6/13 @2 :10 making patient aware

## 2012-12-25 ENCOUNTER — Ambulatory Visit (INDEPENDENT_AMBULATORY_CARE_PROVIDER_SITE_OTHER): Payer: PRIVATE HEALTH INSURANCE | Admitting: *Deleted

## 2012-12-25 DIAGNOSIS — I4891 Unspecified atrial fibrillation: Secondary | ICD-10-CM

## 2012-12-25 DIAGNOSIS — Z7901 Long term (current) use of anticoagulants: Secondary | ICD-10-CM

## 2012-12-26 ENCOUNTER — Telehealth: Payer: Self-pay | Admitting: Cardiology

## 2012-12-26 DIAGNOSIS — R790 Abnormal level of blood mineral: Secondary | ICD-10-CM

## 2012-12-26 MED ORDER — SPIRONOLACTONE 25 MG PO TABS: 25.0000 mg | ORAL_TABLET | Freq: Every day | ORAL | Status: DC

## 2012-12-26 MED ORDER — FUROSEMIDE 40 MG PO TABS: 40.0000 mg | ORAL_TABLET | Freq: Two times a day (BID) | ORAL | Status: DC

## 2012-12-26 NOTE — Telephone Encounter (Signed)
Spoke with pt, his PCP will not refill his furosemide or spironolactone because those meds are not to be taken together. Explained with his diagnosis he would be fine on both meds and we will refill. He also need his magnesium rechecked. Order placed.

## 2012-12-26 NOTE — Telephone Encounter (Signed)
New Prob    Pt has some questions regarding fluid pills. Please call.

## 2012-12-28 ENCOUNTER — Telehealth (INDEPENDENT_AMBULATORY_CARE_PROVIDER_SITE_OTHER): Payer: Self-pay | Admitting: General Surgery

## 2012-12-28 NOTE — Telephone Encounter (Signed)
Message copied by June Leap on Thu Dec 28, 2012  8:57 AM ------      Message from: Axel Filler      Created: Thu Dec 28, 2012  8:34 AM                   ----- Message -----         From: Deliah Goody, RN         Sent: 12/27/2012   1:30 PM           To: Axel Filler, MD                        ----- Message -----         From: Lewayne Bunting, MD         Sent: 12/15/2012  12:12 PM           To: Deliah Goody, RN            Ok to hold coumadin for surgery for duration needed and then resume.      Olga Millers            ----- Message -----         From: Axel Filler, MD         Sent: 12/15/2012  11:29 AM           To: Lewayne Bunting, MD                   ------

## 2012-12-28 NOTE — Telephone Encounter (Signed)
Called to let patient know that we had finally received cardiac clearance and that he would need to hold his Coumadin 7 days prior to his surgery date.Marland KitchenMarland KitchenI took patient's orders around to schedulers at 9:14 6/19 and told patient that they would be contacting him soon..patient understood and was agreeable..told him if he had not heard anything in the next couple days he could call me and I would check the status on it..patient was pleased

## 2012-12-28 NOTE — Telephone Encounter (Signed)
Cardiac clearance needed for surgery

## 2013-01-01 ENCOUNTER — Telehealth: Payer: Self-pay | Admitting: *Deleted

## 2013-01-01 ENCOUNTER — Telehealth: Payer: Self-pay | Admitting: Cardiology

## 2013-01-01 ENCOUNTER — Telehealth (INDEPENDENT_AMBULATORY_CARE_PROVIDER_SITE_OTHER): Payer: Self-pay | Admitting: General Surgery

## 2013-01-01 NOTE — Telephone Encounter (Signed)
Message copied by Carmela Hurt on Mon Jan 01, 2013  7:53 AM ------      Message from: Freddi Starr      Created: Fri Dec 29, 2012  6:25 PM      Regarding: FW: clearance for further surgery                   ----- Message -----         From: Lewayne Bunting, MD         Sent: 12/25/2012  12:04 PM           To: Deliah Goody, RN      Subject: FW: clearance for further surgery                        Ok to dc coumadin 5 days prior to procedure and resume when ok with surgery postop      Olga Millers                  ----- Message -----         From: Carmela Hurt, RN         Sent: 12/25/2012  11:48 AM           To: Deliah Goody, RN, Lewayne Bunting, MD, #      Subject: clearance for further surgery                            Need clearance for procedure thanks, kim                         Will get in touch with Dr. [name] in regards to his Coumadin to see if he can be off further surgery and potentially for one to 2 weeks postoperatively during his healing process. Once we get the okay from Dr. [name] we will schedule the patient for an excision of pilonidal cyst.            Marigene Ehlers., [facility]      12/15/2012, 11:23 AM       ------

## 2013-01-01 NOTE — Telephone Encounter (Signed)
Per dr [name], okay to hold coumadin 7 days prior to procedure. Will forward to lori through EPIC.

## 2013-01-01 NOTE — Telephone Encounter (Signed)
Message copied by Carmela Hurt on Mon Jan 01, 2013  3:05 PM ------      Message from: June Leap      Created: Mon Jan 01, 2013  1:54 PM      Regarding: RE: number of days to hold coumadin ?       Please see either Dr.Crenshaw's note or myself from today and that should answer you question...please let me know if it does not...thanks Lawson Fiscal      ----- Message -----         From: Carmela Hurt, RN         Sent: 01/01/2013   7:57 AM           To: June Leap, Carmela Hurt, RN      Subject: number of days to hold coumadin ?                        Cardiologist sent me message for clearance for 5 days, in your note for patient he is to hold for 7 day, is that true and did Dr [name] say 7 days ok. Thanks, [place] Bing from coumadin clinic       ------

## 2013-01-01 NOTE — Telephone Encounter (Signed)
New problem   Has questions about how long pt can hold is coumandin 5 or 7 days. Please call Lawson Fiscal at [facility].

## 2013-01-01 NOTE — Telephone Encounter (Signed)
Routed to Axel Filler MD ( surgeron )

## 2013-01-01 NOTE — Telephone Encounter (Signed)
Called Dr.Crenshaw's office to verify how many days patient may hold coumadin...in my clearance message says ok to hold for duration needed for surgery then resume.Marland KitchenMarland KitchenLM  At 8/52 on 6/23 for nurse to call me back...spoke with Erin Sons who answers the phones who would route this message

## 2013-01-01 NOTE — Telephone Encounter (Signed)
         Zamarion, Longest - 01/01/2013 8:51 AM ','<More Detail >>       Deliah Goody, RN       Sent: Mon January 01, 2013 10:05 AM    To: June Leap                           [facility] Size     Small Medium Large Extra Extra Large             [name] Description: 62 year old male  01/01/2013 Telephone Provider: Lewayne Bunting, MD  MRN: [id number] Department: [address]                Call Documentation    Deliah Goody, RN at 01/01/2013 10:05 AM    Status: Signed             Per dr [name], okay to hold coumadin 7 days prior to procedure. Will forward to Asianae Minkler through EPIC.        Doreatha Massed at 01/01/2013 8:53 AM    Status: Signed             New problem  Has questions about how long pt can hold is coumandin 5 or 7 days. Please call Lawson Fiscal at [facility].              Encounter MyChart Messages    No messages in this encounter         Routing History    Priority Sent On From To Message Type    01/01/2013 10:05 AM Deliah Goody, RN June Leap Patient Calls    01/01/2013 8:53 AM Debra Lajean Silvius, RN Patient Calls      Created by    Doreatha Massed on 01/01/2013 08:51 AM                           Visit Pharmacy    [id number] [place], [place] - [address]         Contacts      Type Contact Phone   01/01/2013 8:51 AM Phone (Incoming) Scarleth Brame/[facility] [phone]

## 2013-01-01 NOTE — Telephone Encounter (Signed)
Message copied by June Leap on Mon Jan 01, 2013  8:55 AM ------      Message from: Carmela Hurt      Created: Mon Jan 01, 2013  7:57 AM      Regarding: number of days to hold coumadin ?       Cardiologist sent me message for clearance for 5 days, in your note for patient he is to hold for 7 day, is that true and did Dr [name] say 7 days ok. Thanks, [place] Bing from coumadin clinic ------

## 2013-01-02 ENCOUNTER — Encounter (HOSPITAL_COMMUNITY): Payer: Self-pay | Admitting: Pharmacy Technician

## 2013-01-02 ENCOUNTER — Telehealth (INDEPENDENT_AMBULATORY_CARE_PROVIDER_SITE_OTHER): Payer: Self-pay

## 2013-01-02 NOTE — Telephone Encounter (Signed)
Patient is asking when should he stop his coumadin. Advised him to stop it today his surgery is scheduled for 01/08/13. He should not have took coumadin yesterday . I will inform Dr. Derrell Lolling . Patient verbalized understanding

## 2013-01-02 NOTE — Telephone Encounter (Signed)
New Problem  Pt wants you to know that he is going off of his coumadin today.

## 2013-01-02 NOTE — Telephone Encounter (Signed)
Surgery scheduled for 6/30 CYST EXCISION PILONIDAL EXTENSIVE, pt is stopping his coumadin today, instructed patient when he starts back on coumadin to take an extra 1/2 tablet for 2 days in a row, changed f/u coumadin appt to 1 week post op, this procedure may be inpatient.

## 2013-01-04 ENCOUNTER — Encounter (HOSPITAL_COMMUNITY)
Admission: RE | Admit: 2013-01-04 | Discharge: 2013-01-04 | Disposition: A | Discharge status: Home / Self Care | Payer: PRIVATE HEALTH INSURANCE | Source: Ambulatory Visit | Attending: General Surgery | Admitting: General Surgery

## 2013-01-04 ENCOUNTER — Encounter (HOSPITAL_COMMUNITY): Payer: Self-pay

## 2013-01-04 LAB — BASIC METABOLIC PANEL
BUN: 34 mg/dL — ABNORMAL HIGH (ref 6–23)
Chloride: 98 mEq/L (ref 96–112)
GFR calc Af Amer: 46 mL/min — ABNORMAL LOW (ref >90)
GFR calc non Af Amer: 40 mL/min — ABNORMAL LOW (ref >90)
Potassium: 4 mEq/L (ref 3.5–5.1)
Sodium: 140 mEq/L (ref 135–145)

## 2013-01-04 LAB — CBC
HCT: 34.5 % — ABNORMAL LOW (ref 39.0–52.0)
Hemoglobin: 11.5 g/dL — ABNORMAL LOW (ref 13.0–17.0)
MCHC: 33.3 g/dL (ref 30.0–36.0)
RBC: 4.45 MIL/uL (ref 4.22–5.81)
WBC: 18.2 10*3/uL — ABNORMAL HIGH (ref 4.0–10.5)

## 2013-01-04 LAB — APTT: aPTT: 47 seconds — ABNORMAL HIGH (ref 24–37)

## 2013-01-04 LAB — SURGICAL PCR SCREEN: MRSA, PCR: NEGATIVE

## 2013-01-04 LAB — PROTIME-INR
INR: 1.85 — ABNORMAL HIGH (ref 0.00–1.49)
Prothrombin Time: 20.8 seconds — ABNORMAL HIGH (ref 11.6–15.2)

## 2013-01-04 NOTE — Pre-Procedure Instructions (Signed)
[name]  01/04/2013   Your procedure is scheduled on:  01/08/13  Report to [facility] at 1100 AM.  Call this number if you have problems the morning of surgery: [phone]   Remember:   Do not eat food or drink liquids after midnight.   Take these medicines the morning of surgery with A SIP OF WATER: neurontin,metoprolol,oxycodone,aldactone,eye drops   Do not wear jewelry, make-up or nail polish.  Do not wear lotions, powders, or perfumes. You may wear deodorant.  Do not shave 48 hours prior to surgery. Men may shave face and neck.  Do not bring valuables to the hospital.  [facility] is not responsible                   for any belongings or valuables.  Contacts, dentures or bridgework may not be worn into surgery.  Leave suitcase in the car. After surgery it may be brought to your room.  For patients admitted to the hospital, checkout time is 11:00 AM the day of  discharge.   Patients discharged the day of surgery will not be allowed to drive  home.  Name and phone number of your driver: family  Special Instructions: Shower using CHG 2 nights before surgery and the night before surgery.  If you shower the day of surgery use CHG.  Use special wash - you have one bottle of CHG for all showers.  You should use approximately 1/3 of the bottle for each shower.   Please read over the following fact sheets that you were given: Pain Booklet, Coughing and Deep Breathing, MRSA Information and Surgical Site Infection Prevention

## 2013-01-04 NOTE — Progress Notes (Signed)
Sleep study req. At [facility] sleep study. Dr Liliane Bade is cardiac md

## 2013-01-04 NOTE — Progress Notes (Signed)
Will have PA review cardiac hx

## 2013-01-05 ENCOUNTER — Telehealth: Payer: Self-pay | Admitting: Cardiology

## 2013-01-05 NOTE — Telephone Encounter (Signed)
[name] called from short stay [place] to see if Dr. [name]  Has written  a note stating that pt is clear for surgery on Monday 01/08/13. [name] is aware that Dr. [name] is aware that pt is having surgery and MD okay for pt to stop the coumadin medication 7 days prior surgery. [name] states he will write a note stating that MD is aware that pt is having the surgery.

## 2013-01-05 NOTE — Progress Notes (Signed)
This is a 62 year old male who is scheduled for excision of pilonidal cyst to be performed by Dr. Axel Filler on Monday 08 January 2013.   A note in EPIC dated 28 December 2012 from the surgeon's office states that "cardiac clearance needed for surgery."   A note in EPIC dated 28 December 2012 from the surgeon's office states "Called to let patient know that we had finally received cardiac clearance."    The patient's cardiologist, Dr. Olga Millers, is obviously aware that his patient is scheduled for surgery as he provided instructions regarding stopping coumadin prior to surgery and resuming it after surgery. I contacted Drs. Ramirez's and Crenshaw's offices seeking documentation that specifically provides cardiac clearance but was unable to locate a specific document.  Coags results dated 04 January 2013 are elevated with PT-20.8, INR-1.85, PTT-47. A note in EPIC dated 02 January 2013 states that this patient was instructed to stop coumadin as of that day.   Coags have been ordered for DOS.  May proceed with surgery as scheduled.  Kelton Pillar. Saretta Dahlem, PA-C

## 2013-01-05 NOTE — Telephone Encounter (Signed)
New problem     Please advise on cardiac clearance  Surgery on Monday.

## 2013-01-08 ENCOUNTER — Ambulatory Visit (HOSPITAL_COMMUNITY)
Admission: RE | Admit: 2013-01-08 | Discharge: 2013-01-08 | Disposition: A | Discharge status: Home / Self Care | Payer: PRIVATE HEALTH INSURANCE | Source: Ambulatory Visit | Attending: General Surgery | Admitting: General Surgery

## 2013-01-08 ENCOUNTER — Ambulatory Visit (HOSPITAL_COMMUNITY): Payer: PRIVATE HEALTH INSURANCE | Admitting: Anesthesiology

## 2013-01-08 ENCOUNTER — Encounter (HOSPITAL_COMMUNITY): Payer: Self-pay | Admitting: *Deleted

## 2013-01-08 ENCOUNTER — Encounter (HOSPITAL_COMMUNITY)
Admission: RE | Disposition: A | Discharge status: Home / Self Care | Payer: Self-pay | Source: Ambulatory Visit | Attending: General Surgery

## 2013-01-08 ENCOUNTER — Encounter (HOSPITAL_COMMUNITY): Payer: Self-pay | Admitting: Anesthesiology

## 2013-01-08 DIAGNOSIS — I10 Essential (primary) hypertension: Secondary | ICD-10-CM | POA: Insufficient documentation

## 2013-01-08 DIAGNOSIS — D649 Anemia, unspecified: Secondary | ICD-10-CM | POA: Insufficient documentation

## 2013-01-08 DIAGNOSIS — Z794 Long term (current) use of insulin: Secondary | ICD-10-CM | POA: Insufficient documentation

## 2013-01-08 DIAGNOSIS — I4891 Unspecified atrial fibrillation: Secondary | ICD-10-CM | POA: Insufficient documentation

## 2013-01-08 DIAGNOSIS — I252 Old myocardial infarction: Secondary | ICD-10-CM | POA: Insufficient documentation

## 2013-01-08 DIAGNOSIS — Z7901 Long term (current) use of anticoagulants: Secondary | ICD-10-CM | POA: Insufficient documentation

## 2013-01-08 DIAGNOSIS — L0591 Pilonidal cyst without abscess: Secondary | ICD-10-CM | POA: Insufficient documentation

## 2013-01-08 DIAGNOSIS — G589 Mononeuropathy, unspecified: Secondary | ICD-10-CM | POA: Insufficient documentation

## 2013-01-08 DIAGNOSIS — G4730 Sleep apnea, unspecified: Secondary | ICD-10-CM | POA: Insufficient documentation

## 2013-01-08 DIAGNOSIS — I509 Heart failure, unspecified: Secondary | ICD-10-CM | POA: Insufficient documentation

## 2013-01-08 DIAGNOSIS — Z87891 Personal history of nicotine dependence: Secondary | ICD-10-CM | POA: Insufficient documentation

## 2013-01-08 DIAGNOSIS — I2510 Atherosclerotic heart disease of native coronary artery without angina pectoris: Secondary | ICD-10-CM | POA: Insufficient documentation

## 2013-01-08 DIAGNOSIS — M069 Rheumatoid arthritis, unspecified: Secondary | ICD-10-CM | POA: Insufficient documentation

## 2013-01-08 DIAGNOSIS — Z79899 Other long term (current) drug therapy: Secondary | ICD-10-CM | POA: Insufficient documentation

## 2013-01-08 DIAGNOSIS — E119 Type 2 diabetes mellitus without complications: Secondary | ICD-10-CM | POA: Insufficient documentation

## 2013-01-08 HISTORY — PX: PILONIDAL CYST EXCISION: SHX744

## 2013-01-08 LAB — APTT: aPTT: 35 seconds (ref 24–37)

## 2013-01-08 SURGERY — EXCISION, PILONIDAL CYST, EXTENSIVE
Anesthesia: General | Site: Buttocks | Wound class: Dirty or Infected

## 2013-01-08 MED ORDER — CLINDAMYCIN HCL 150 MG PO CAPS: 150.0000 mg | ORAL_CAPSULE | Freq: Three times a day (TID) | ORAL | Status: AC

## 2013-01-08 MED ORDER — LACTATED RINGERS IV SOLN: INTRAVENOUS | Status: DC

## 2013-01-08 MED ORDER — GLYCOPYRROLATE 0.2 MG/ML IJ SOLN
INTRAMUSCULAR | Status: DC | PRN
  Administered 2013-01-08: .8 mg via INTRAVENOUS

## 2013-01-08 MED ORDER — OXYCODONE-ACETAMINOPHEN 10-325 MG PO TABS: 1.0000 | ORAL_TABLET | ORAL | Status: DC | PRN

## 2013-01-08 MED ORDER — SODIUM CHLORIDE 0.9 % IV SOLN
INTRAVENOUS | Status: DC | PRN
  Administered 2013-01-08 (×3): via INTRAVENOUS

## 2013-01-08 MED ORDER — NEOSTIGMINE METHYLSULFATE 1 MG/ML IJ SOLN
INTRAMUSCULAR | Status: DC | PRN
  Administered 2013-01-08: 5 mg via INTRAVENOUS

## 2013-01-08 MED ORDER — SODIUM CHLORIDE 0.9 % IV SOLN
INTRAVENOUS | Status: DC
  Administered 2013-01-08: 12:00:00 via INTRAVENOUS

## 2013-01-08 MED ORDER — OXYCODONE HCL 5 MG PO TABS
ORAL_TABLET | ORAL | Status: AC
  Administered 2013-01-08: 5 mg via ORAL
  Filled 2013-01-08: qty 1

## 2013-01-08 MED ORDER — SUCCINYLCHOLINE CHLORIDE 20 MG/ML IJ SOLN
INTRAMUSCULAR | Status: DC | PRN
  Administered 2013-01-08: 120 mg via INTRAVENOUS

## 2013-01-08 MED ORDER — CEFAZOLIN SODIUM 1-5 GM-% IV SOLN: 1.0000 g | Freq: Three times a day (TID) | INTRAVENOUS | Status: DC

## 2013-01-08 MED ORDER — LIDOCAINE HCL (CARDIAC) 20 MG/ML IV SOLN
INTRAVENOUS | Status: DC | PRN
  Administered 2013-01-08: 70 mg via INTRAVENOUS

## 2013-01-08 MED ORDER — BUPIVACAINE-EPINEPHRINE PF 0.25-1:200000 % IJ SOLN
INTRAMUSCULAR | Status: AC
  Filled 2013-01-08: qty 30

## 2013-01-08 MED ORDER — CEFAZOLIN SODIUM 1-5 GM-% IV SOLN
INTRAVENOUS | Status: DC | PRN
  Administered 2013-01-08 (×3): 1 g via INTRAVENOUS

## 2013-01-08 MED ORDER — BUPIVACAINE-EPINEPHRINE (PF) 0.5% -1:200000 IJ SOLN
INTRAMUSCULAR | Status: AC
  Filled 2013-01-08: qty 10

## 2013-01-08 MED ORDER — PROPOFOL 10 MG/ML IV BOLUS
INTRAVENOUS | Status: DC | PRN
  Administered 2013-01-08: 250 mg via INTRAVENOUS

## 2013-01-08 MED ORDER — ROCURONIUM BROMIDE 100 MG/10ML IV SOLN
INTRAVENOUS | Status: DC | PRN
  Administered 2013-01-08: 50 mg via INTRAVENOUS

## 2013-01-08 MED ORDER — BUPIVACAINE-EPINEPHRINE PF 0.25-1:200000 % IJ SOLN
INTRAMUSCULAR | Status: DC | PRN
  Administered 2013-01-08: 20 mL

## 2013-01-08 MED ORDER — OXYCODONE HCL 5 MG/5ML PO SOLN: 5.0000 mg | Freq: Once | ORAL | Status: AC | PRN

## 2013-01-08 MED ORDER — FENTANYL CITRATE 0.05 MG/ML IJ SOLN
25.0000 ug | INTRAMUSCULAR | Status: DC | PRN
  Administered 2013-01-08: 50 ug via INTRAVENOUS

## 2013-01-08 MED ORDER — FENTANYL CITRATE 0.05 MG/ML IJ SOLN
INTRAMUSCULAR | Status: DC | PRN
  Administered 2013-01-08 (×5): 50 ug via INTRAVENOUS

## 2013-01-08 MED ORDER — CEFAZOLIN SODIUM 1-5 GM-% IV SOLN
INTRAVENOUS | Status: AC
  Filled 2013-01-08: qty 150

## 2013-01-08 MED ORDER — SULFAMETHOXAZOLE-TRIMETHOPRIM 800-160 MG PO TABS: 1.0000 | ORAL_TABLET | Freq: Two times a day (BID) | ORAL | Status: DC

## 2013-01-08 MED ORDER — ONDANSETRON HCL 4 MG/2ML IJ SOLN
INTRAMUSCULAR | Status: DC | PRN
  Administered 2013-01-08: 4 mg via INTRAVENOUS

## 2013-01-08 MED ORDER — DEXTROSE 5 % IV SOLN
INTRAVENOUS | Status: DC | PRN
  Administered 2013-01-08 (×3): via INTRAVENOUS

## 2013-01-08 MED ORDER — OXYCODONE HCL 5 MG PO TABS: 5.0000 mg | ORAL_TABLET | Freq: Once | ORAL | Status: AC | PRN

## 2013-01-08 MED ORDER — ARTIFICIAL TEARS OP OINT
TOPICAL_OINTMENT | OPHTHALMIC | Status: DC | PRN
  Administered 2013-01-08: 1 via OPHTHALMIC

## 2013-01-08 MED ORDER — FENTANYL CITRATE 0.05 MG/ML IJ SOLN
INTRAMUSCULAR | Status: AC
  Administered 2013-01-08: 50 ug via INTRAVENOUS
  Filled 2013-01-08: qty 2

## 2013-01-08 SURGICAL SUPPLY — 44 items
BENZOIN TINCTURE PRP APPL 2/3 (GAUZE/BANDAGES/DRESSINGS) IMPLANT
BLADE SURG 10 STRL SS (BLADE) ×2 IMPLANT
BLADE SURG ROTATE 9660 (MISCELLANEOUS) IMPLANT
CANISTER SUCTION 2500CC (MISCELLANEOUS) IMPLANT
CHLORAPREP W/TINT 26ML (MISCELLANEOUS) ×2 IMPLANT
CLEANER TIP ELECTROSURG 2X2 (MISCELLANEOUS) ×2 IMPLANT
CLOTH BEACON ORANGE TIMEOUT ST (SAFETY) ×2 IMPLANT
COVER SURGICAL LIGHT HANDLE (MISCELLANEOUS) ×2 IMPLANT
DECANTER SPIKE VIAL GLASS SM (MISCELLANEOUS) IMPLANT
DRAPE LAPAROTOMY T 102X78X121 (DRAPES) ×2 IMPLANT
DRAPE UTILITY 15X26 W/TAPE STR (DRAPE) ×4 IMPLANT
DRSG PAD ABDOMINAL 8X10 ST (GAUZE/BANDAGES/DRESSINGS) ×2 IMPLANT
ELECT REM PT RETURN 9FT ADLT (ELECTROSURGICAL) ×2
ELECTRODE REM PT RTRN 9FT ADLT (ELECTROSURGICAL) ×1 IMPLANT
GLOVE BIOGEL PI IND STRL 8 (GLOVE) ×1 IMPLANT
GLOVE BIOGEL PI INDICATOR 8 (GLOVE) ×1
GLOVE ECLIPSE 7.5 STRL STRAW (GLOVE) ×2 IMPLANT
GOWN STRL NON-REIN LRG LVL3 (GOWN DISPOSABLE) ×4 IMPLANT
KIT BASIN OR (CUSTOM PROCEDURE TRAY) ×2 IMPLANT
KIT ROOM TURNOVER OR (KITS) ×2 IMPLANT
NEEDLE 22X1 1/2 (OR ONLY) (NEEDLE) IMPLANT
NS IRRIG 1000ML POUR BTL (IV SOLUTION) ×2 IMPLANT
PACK SURGICAL SETUP 50X90 (CUSTOM PROCEDURE TRAY) ×2 IMPLANT
PAD ARMBOARD 7.5X6 YLW CONV (MISCELLANEOUS) ×4 IMPLANT
PENCIL BUTTON HOLSTER BLD 10FT (ELECTRODE) ×2 IMPLANT
SPECIMEN JAR SMALL (MISCELLANEOUS) ×2 IMPLANT
SPONGE GAUZE 4X4 12PLY (GAUZE/BANDAGES/DRESSINGS) ×2 IMPLANT
SPONGE LAP 18X18 X RAY DECT (DISPOSABLE) ×2 IMPLANT
SPONGE LAP 4X18 X RAY DECT (DISPOSABLE) ×2 IMPLANT
SUT ETHILON 2 0 FS 18 (SUTURE) IMPLANT
SUT VIC AB 2-0 CT1 27 (SUTURE)
SUT VIC AB 2-0 CT1 TAPERPNT 27 (SUTURE) IMPLANT
SUT VIC AB 3-0 SH 27 (SUTURE)
SUT VIC AB 3-0 SH 27X BRD (SUTURE) IMPLANT
SWAB COLLECTION DEVICE MRSA (MISCELLANEOUS) IMPLANT
SYR CONTROL 10ML LL (SYRINGE) IMPLANT
TAPE CLOTH SURG 6X10 WHT LF (GAUZE/BANDAGES/DRESSINGS) ×2 IMPLANT
TOWEL OR 17X24 6PK STRL BLUE (TOWEL DISPOSABLE) ×2 IMPLANT
TOWEL OR 17X26 10 PK STRL BLUE (TOWEL DISPOSABLE) ×2 IMPLANT
TUBE ANAEROBIC SPECIMEN COL (MISCELLANEOUS) IMPLANT
TUBE CONNECTING 12X1/4 (SUCTIONS) IMPLANT
UNDERPAD 30X30 INCONTINENT (UNDERPADS AND DIAPERS) ×2 IMPLANT
WATER STERILE IRR 1000ML POUR (IV SOLUTION) ×2 IMPLANT
YANKAUER SUCT BULB TIP NO VENT (SUCTIONS) IMPLANT

## 2013-01-08 NOTE — H&P (View-Only) (Signed)
Patient ID: [name], male   DOB: September 08, 1950, 62 y.o.   MRN: [id number]  Chief Complaint  Patient presents with  . New Evaluation    eval pilonidal absc    HPI [name] is a 62 y.o. male.  The patient is a 62 year old male who was recently seen at the hospital secondary to chronic pilonidal cyst infection. The patient was at that time on Coumadin and INR greater than 1.5 and was treated conservatively. The patient states since that time to continue with his chronic pain to the  Cyst area. He states usually clear drainage coming from the area it is somewhat painful.   The patient is currently on Coumadin secondary to A. Fib. He states he's been normal sinus rhythm for a year. The patient also has had a stent placement but is not taking Plavix. Patient sees Dr. [name] manages his Coumadin for his A. Fib. HPI  Past Medical History  Diagnosis Date  . Diabetes mellitus   . Hypertension   . Rheumatoid arthritis(714.0)   . Neuropathy   . CHF (congestive heart failure)   . History of MI (myocardial infarction)     SILENT MI IN PAST   . CAD (coronary artery disease)   . Atrial fibrillation   . Anemia   . Sleep apnea      SEVERE, NO CPAP USED, DOES NOT KNOW  WHERE SLEEP STUDY DONE  . Myocardial infarction     pt stated he had a slient heart attack that showed up on nuclear stress.  . Shortness of breath     with ambulation    Past Surgical History  Procedure Laterality Date  . Abdominal surgery  AGE 32 MONTHS    BENIGN TUMOR REMOVED  . Debridement  foot  FEW YRS AGO    LEFT FOOT, WOUNDS ALL HEALED NOW  . Coronary angioplasty with stent placement  August 2012    RCA DES - [facility]  . Cataract extraction w/phaco Right 11/15/2012    Procedure: CATARACT EXTRACTION PHACO AND INTRAOCULAR LENS PLACEMENT (IOC);  Surgeon: Shade Flood, MD;  Location: [facility] OR;  Service: Ophthalmology;  Laterality: Right;  . Cataract extraction w/phaco Left 11/29/2012   Procedure: CATARACT EXTRACTION PHACO AND INTRAOCULAR LENS PLACEMENT (IOC);  Surgeon: Shade Flood, MD;  Location: [facility] OR;  Service: Ophthalmology;  Laterality: Left;    Family History  Problem Relation Age of Onset  . Other Other     PT ADOPTED    Social History History  Substance Use Topics  . Smoking status: Former Smoker -- 1.00 packs/day for 20 years    Types: Cigarettes    Quit date: 07/13/1983  . Smokeless tobacco: Never Used  . Alcohol Use: No    No Known Allergies  Current Outpatient Prescriptions  Medication Sig Dispense Refill  . ACCU-CHEK AVIVA PLUS test strip       . ACCU-CHEK FASTCLIX LANCETS MISC       . ADACEL 11-10-13.5 injection       . clindamycin (CLEOCIN) 300 MG capsule       . exenatide (BYETTA) 5 MCG/0.02ML SOPN Inject 5 mcg into the skin 2 (two) times daily with a meal.      . furosemide (LASIX) 40 MG tablet Take 40 mg by mouth 2 (two) times daily.      [name] gabapentin (NEURONTIN) 300 MG capsule Take 600 mg by mouth 2 (two) times daily.      . insulin aspart (  NOVOLOG FLEXPEN) 100 unit/mL SOLN FlexPen Inject 50 Units into the skin 2 (two) times daily with a meal.      . insulin glargine (LANTUS) 100 UNIT/ML injection Inject 100 Units into the skin 2 (two) times daily.      [name] KLOR-CON M20 20 MEQ tablet TAKE 1 TABLET BY MOUTH TWICE A DAY  60 tablet  5  . lisinopril-hydrochlorothiazide (PRINZIDE,ZESTORETIC) 20-12.5 MG per tablet Take 1 tablet by mouth daily.       . magnesium oxide (MAG-OX) 400 MG tablet Take 1 tablet (400 mg total) by mouth daily.  30 tablet  12  . metaxalone (SKELAXIN) 800 MG tablet Take 800 mg by mouth daily.      . metFORMIN (GLUCOPHAGE) 500 MG tablet Take 500 mg by mouth 2 (two) times daily with a meal.      . metoprolol tartrate (LOPRESSOR) 12.5 mg TABS Take 0.5 tablets (12.5 mg total) by mouth 2 (two) times daily.  30 tablet  0  . Multiple Vitamin (MULTIVITAMIN WITH MINERALS) TABS Take 1 tablet by mouth daily.      [name] oxycodone (ROXICODONE)  30 MG immediate release tablet Take 1 tablet (30 mg total) by mouth every 4 (four) hours as needed for pain. Pain  180 tablet  0  . potassium chloride SA (K-DUR,KLOR-CON) 20 MEQ tablet Take 20 mEq by mouth 2 (two) times daily.      . rosuvastatin (CRESTOR) 10 MG tablet Take 10 mg by mouth every evening.      [name] spironolactone (ALDACTONE) 25 MG tablet Take 1 tablet (25 mg total) by mouth daily.  30 tablet  0  . sulindac (CLINORIL) 200 MG tablet       . warfarin (COUMADIN) 5 MG tablet Take 5-7.5 mg by mouth daily. 5mg  every day except on Thursday and Sunday take 7.5mg       . ZOSTAVAX 09811 UNT/0.65ML injection        No current facility-administered medications for this visit.    Review of Systems Review of Systems  Constitutional: Negative.   HENT: Negative.   Respiratory: Negative.   Cardiovascular: Negative.   Gastrointestinal: Negative.   Neurological: Negative.   Hematological: Negative.   All other systems reviewed and are negative.    Blood pressure 138/60, pulse 78, temperature 97.3 F (36.3 C), temperature source Temporal, resp. rate 14, height 6' (1.829 m), weight 370 lb (167.831 kg).  Physical Exam Physical Exam  Constitutional: He is oriented to person, place, and time. He appears well-developed and well-nourished.  HENT:  Head: Normocephalic and atraumatic.  Eyes: Conjunctivae and EOM are normal. Pupils are equal, round, and reactive to light.  Neck: Normal range of motion. Neck supple.  Cardiovascular: Normal rate, regular rhythm and normal heart sounds.   Pulmonary/Chest: Effort normal and breath sounds normal.  Abdominal: Soft. Bowel sounds are normal. There is no tenderness. There is no rebound and no guarding.  Genitourinary: Penis normal.     A cystic, no purulence could be expressed or drainage.  Musculoskeletal: Normal range of motion.  Neurological: He is oriented to person, place, and time.    Data Reviewed none  Assessment    62 year old male  with a chronic pilonidal cyst.     Plan    1. I discussed with the patient complications of excising the pilonidal cyst as well as healing complications associated with this. Secondary to pain and chronicity of the cyst he would like to proceed with excising the pilonidal cyst.  2.We discussed the risks and benefits of the cyst excision to include infection, prolonged healing, recurrence, and possible need for further surgery. 3. Will get in touch with Dr. [name] in regards to his Coumadin to see if he can be off further surgery and potentially for one to 2 weeks postoperatively during his healing process. Once we get the okay from Dr. [name] we will schedule the patient for an excision of pilonidal cyst.        Marigene Ehlers., [facility] 12/15/2012, 11:23 AM

## 2013-01-08 NOTE — Preoperative (Signed)
Beta Blockers   Reason not to administer Beta Blockers:Metoprolol hs 01/07/2013

## 2013-01-08 NOTE — Op Note (Signed)
Pre Operative Diagnosis:  Pilonidal cyst  Post Operative Diagnosis: same  Procedure: excision of pilonidal cyst  Surgeon: Dr. Axel Filler  Assistant: none  Anesthesia: GETA  EBL: 10 cc  Complications: none  Counts: reported as correct x 2  Findings:  The patient had 2 pilonidal cyst likely joined in the cavity. The pilonidal cysts were excised down to healthy fat tissue. The ultimate excision size approximately 3 cm x 0.5 cm x 4 cm.  Indications for procedure:  The patient is a 62 year old male with a history of pilonidal cyst. Patient was seen in clinic secondary to pain and have this electively excised. We discussed the difficulties of wound healing I would likely be associated with this, the patient understood the risks and wished to proceed with the surgery.  Details of the procedure:The patient was taken back to the operating room. The patient was placed in supine position with bilateral SCDs in place. After appropriate anitbiotics were confirmed, a time-out was confirmed and all facts were verified.  Elliptical incision was made around the pilonidal cyst and bovie cautery was used to maintain hemostasis and carrired down to health, uninfected adipose tissue.  Hemostasis of wound was acheieved with bovie cautery, which was excellent at this portion of  The case.  0.25% Marcaine with epi was used to infiltrate the area.  The area was irrigated it with sterile saline.  A 2-0 vicryl was used to approximate a deep layer.  A 3-0 vicryl was used to approximate the deep dermal layer and 2-0 nylons were used in a veritcal mattress stitch.  The wound was dressed with 4x4s and an ABD pan. The patient was taken to the recovery room in stable condition.

## 2013-01-08 NOTE — Interval H&P Note (Signed)
History and Physical Interval Note:  01/08/2013 11:06 AM  [name]  has presented today for surgery, with the diagnosis of pilonidal cyst  The various methods of treatment have been discussed with the patient and family. After consideration of risks, benefits and other options for treatment, the patient has consented to  Procedure(s): CYST EXCISION PILONIDAL EXTENSIVE (N/A) as a surgical intervention .  The patient's history has been reviewed, patient examined, no change in status, stable for surgery.  I have reviewed the patient's chart and labs.  Questions were answered to the patient's satisfaction.     Marigene Ehlers., [facility]

## 2013-01-08 NOTE — Anesthesia Procedure Notes (Signed)
Procedure Name: Intubation Date/Time: 01/08/2013 2:08 PM Performed by: Darcey Nora B Pre-anesthesia Checklist: Patient identified, Patient being monitored, Emergency Drugs available and Suction available Patient Re-evaluated:Patient Re-evaluated prior to inductionOxygen Delivery Method: Circle system utilized Preoxygenation: Pre-oxygenation with 100% oxygen Intubation Type: IV induction Ventilation: Mask ventilation without difficulty Laryngoscope Size: Mac and 4 Grade View: Grade I Tube type: Oral Tube size: 8.0 mm Number of attempts: 1 Airway Equipment and Method: Stylet Placement Confirmation: ETT inserted through vocal cords under direct vision,  breath sounds checked- equal and bilateral and positive ETCO2 Secured at: 22 (cm at teeth) cm Tube secured with: Tape (folded adhesive cloth taped around ETT and behind pt head.) Dental Injury: Teeth and Oropharynx as per pre-operative assessment

## 2013-01-08 NOTE — Anesthesia Preprocedure Evaluation (Addendum)
Anesthesia Evaluation  Patient identified by MRN, date of birth, ID band Patient awake    Reviewed: Allergy & Precautions, H&P , NPO status , Patient's Chart, lab work & pertinent test results, reviewed documented beta blocker date and time   Airway Mallampati: II TM Distance: >3 FB Neck ROM: Full    Dental no notable dental hx. (+) Teeth Intact, Dental Advisory Given and Caps   Pulmonary sleep apnea ,  Recent sleep study;  Has not received results yet. breath sounds clear to auscultation  Pulmonary exam normal       Cardiovascular hypertension, On Medications, On Home Beta Blockers, Pt. on home beta blockers and Pt. on medications + CAD, + Past MI and +CHF + dysrhythmias Atrial Fibrillation Rhythm:Regular Rate:Normal     Neuro/Psych negative neurological ROS  negative psych ROS   GI/Hepatic negative GI ROS, Neg liver ROS,   Endo/Other  diabetes, Type 2, Insulin DependentMorbid obesityPt states he has been treated for DM X 25 yrs.  Took 100 units Lantus hs.  Renal/GU negative Renal ROS  negative genitourinary   Musculoskeletal  (+) Arthritis -,   Abdominal   Peds  Hematology negative hematology ROS (+)   Anesthesia Other Findings ?4 unit bridge across top front.  Reproductive/Obstetrics negative OB ROS                        Anesthesia Physical Anesthesia Plan  ASA: III  Anesthesia Plan: General   Post-op Pain Management:    Induction: Intravenous  Airway Management Planned: Oral ETT  Additional Equipment:   Intra-op Plan:   Post-operative Plan: Extubation in OR  Informed Consent: I have reviewed the patients History and Physical, chart, labs and discussed the procedure including the risks, benefits and alternatives for the proposed anesthesia with the patient or authorized representative who has indicated his/her understanding and acceptance.   Dental advisory given  Plan  Discussed with: CRNA, Anesthesiologist and Surgeon  Anesthesia Plan Comments:        Anesthesia Quick Evaluation

## 2013-01-08 NOTE — Anesthesia Postprocedure Evaluation (Signed)
  Anesthesia Post-op Note  Patient: [name]  Procedure(s) Performed: Procedure(s): CYST EXCISION PILONIDAL EXTENSIVE (N/A)  Patient Location: PACU  Anesthesia Type:General  Level of Consciousness: awake  Airway and Oxygen Therapy: Patient Spontanous Breathing  Post-op Pain: mild  Post-op Assessment: Post-op Vital signs reviewed  Post-op Vital Signs: Reviewed  Complications: No apparent anesthesia complications

## 2013-01-08 NOTE — Transfer of Care (Signed)
Immediate Anesthesia Transfer of Care Note  Patient: [name]  Procedure(s) Performed: Procedure(s): CYST EXCISION PILONIDAL EXTENSIVE (N/A)  Patient Location: PACU  Anesthesia Type:General  Level of Consciousness: awake, alert , oriented and patient cooperative  Airway & Oxygen Therapy: Patient Spontanous Breathing and Patient connected to face mask oxygen  Post-op Assessment: Report given to PACU RN, Post -op Vital signs reviewed and stable and Patient moving all extremities  Post vital signs: Reviewed and stable  Complications: No apparent anesthesia complications

## 2013-01-08 NOTE — Progress Notes (Signed)
Pt. Arrived to short stay, no orders from md. Called office for orders.

## 2013-01-09 ENCOUNTER — Telehealth (INDEPENDENT_AMBULATORY_CARE_PROVIDER_SITE_OTHER): Payer: Self-pay | Admitting: *Deleted

## 2013-01-09 LAB — GLUCOSE, CAPILLARY: Glucose-Capillary: 186 mg/dL — ABNORMAL HIGH (ref 70–99)

## 2013-01-09 NOTE — Telephone Encounter (Signed)
Patient called to ask when he should start back on his Coumadin.  Spoke to Dr. Dwain Sarna since Dr. Derrell Lolling is unavailable who advised to have patient take 5mg  today then wait for [place] to call him tomorrow after speaking to Dr. Derrell Lolling about future doses.  Patient also asked about his dressing and when it should be changed.  Patient states he does not have anyone who can change the dressing for him so he was unsure as to what he needed to do and when.  Patient states understanding that someone from the office will call him tomorrow regarding both his Coumadin and his dressing.

## 2013-01-10 ENCOUNTER — Encounter (HOSPITAL_COMMUNITY): Payer: Self-pay | Admitting: General Surgery

## 2013-01-10 ENCOUNTER — Telehealth (INDEPENDENT_AMBULATORY_CARE_PROVIDER_SITE_OTHER): Payer: Self-pay | Admitting: General Surgery

## 2013-01-10 NOTE — Telephone Encounter (Signed)
Talked with the [name] and discussed restarting his Coumadin as previously ordered.  He doesn't need any dressing changes, just dressing to keep his shorts dry.  He understood and will call us back if he has any problems.

## 2013-01-19 ENCOUNTER — Ambulatory Visit (INDEPENDENT_AMBULATORY_CARE_PROVIDER_SITE_OTHER): Payer: PRIVATE HEALTH INSURANCE | Admitting: *Deleted

## 2013-01-19 DIAGNOSIS — Z7901 Long term (current) use of anticoagulants: Secondary | ICD-10-CM

## 2013-01-19 DIAGNOSIS — I4891 Unspecified atrial fibrillation: Secondary | ICD-10-CM

## 2013-01-23 ENCOUNTER — Encounter (INDEPENDENT_AMBULATORY_CARE_PROVIDER_SITE_OTHER): Payer: 59 | Admitting: General Surgery

## 2013-01-25 ENCOUNTER — Ambulatory Visit (INDEPENDENT_AMBULATORY_CARE_PROVIDER_SITE_OTHER): Payer: 59 | Admitting: General Surgery

## 2013-01-25 ENCOUNTER — Encounter (INDEPENDENT_AMBULATORY_CARE_PROVIDER_SITE_OTHER): Payer: Self-pay | Admitting: General Surgery

## 2013-01-25 VITALS — BP 140/78 | HR 72 | Temp 96.8°F | Resp 14 | Ht 72.0 in | Wt 352.0 lb

## 2013-01-25 DIAGNOSIS — L0591 Pilonidal cyst without abscess: Secondary | ICD-10-CM

## 2013-01-25 NOTE — Progress Notes (Signed)
Patient ID: [name], male   DOB: [birth year], 62 y.o.   MRN: [id number] The patient is a 62 year old status post pilonidal cyst excision. The patient has had some pain postoperatively. He's had no purulent drainage. The wound seems to have dehisced  On exam: Wound dehiscence Fibrous exudate in the wound. No purulence no erythema  Assessment and plan: 62 year old male status post pilonidal cyst excision 1. followup in 2 weeks for a wound check 2. I discussed with him back and we'll continue the dressing once a day with continued showers. 3. I will give the pt a prescription for pain medication

## 2013-02-02 ENCOUNTER — Telehealth (INDEPENDENT_AMBULATORY_CARE_PROVIDER_SITE_OTHER): Payer: Self-pay | Admitting: General Surgery

## 2013-02-02 ENCOUNTER — Telehealth (INDEPENDENT_AMBULATORY_CARE_PROVIDER_SITE_OTHER): Payer: Self-pay

## 2013-02-02 NOTE — Telephone Encounter (Signed)
He can have a refill or you can call in something for pain, I will be by there later.  I would advise him to use some ibuprofen for pain in between.

## 2013-02-02 NOTE — Telephone Encounter (Signed)
Pt requesting a refill on Percocet. The pt stated that he got a refill last week while seeing Dr Derrell Lolling on 7/17 for Percocet 5mg  but he originally had received Percocet 10mg . Pls advise.

## 2013-02-02 NOTE — Telephone Encounter (Signed)
Called patient and let him know that he has a Rx of Percocet 10/325 1-2 po q 4 hrs prn for pain # 30 w/o refills. Patient will come by on Monday and pick up Rx

## 2013-02-07 ENCOUNTER — Telehealth (INDEPENDENT_AMBULATORY_CARE_PROVIDER_SITE_OTHER): Payer: Self-pay | Admitting: General Surgery

## 2013-02-07 NOTE — Telephone Encounter (Signed)
Patient called in explaining that he will run out of his Percocet 10/325mg  after tomorrow.  He explained that he would like one more refill to help him get through the rest of this week and weekend.  I asked him if he was using ibuprofen in between to help alleviate the breakthrough pain.  He explained he was not.  I informed him that he should do this but that I would also send a message to Dr. Derrell Lolling about the refill of Percocet since he will be back in the office tomorrow.

## 2013-02-08 ENCOUNTER — Other Ambulatory Visit (INDEPENDENT_AMBULATORY_CARE_PROVIDER_SITE_OTHER): Payer: Self-pay | Admitting: General Surgery

## 2013-02-08 MED ORDER — OXYCODONE-ACETAMINOPHEN 10-325 MG PO TABS: 1.0000 | ORAL_TABLET | Freq: Four times a day (QID) | ORAL | Status: DC | PRN

## 2013-02-08 NOTE — Telephone Encounter (Signed)
LMOM at 10:48 7/31 asking patient to call Lawson Fiscal back

## 2013-02-08 NOTE — Telephone Encounter (Signed)
The pt called in to follow up his call from yesterday and make sure the message was rec'd.  I informed him that it was and Dr Derrell Lolling is here today and it will be addressed with him.  We will call the pt back.

## 2013-02-08 NOTE — Telephone Encounter (Signed)
Let patient know that RX for percocet 10/325 # 30 no refills was at the front dest for pick up and that he would have to take ibuprofen in between per verbal orders from [facility]...patient was agreeable and stated that he would try the ibuprofen

## 2013-02-15 ENCOUNTER — Ambulatory Visit (INDEPENDENT_AMBULATORY_CARE_PROVIDER_SITE_OTHER): Payer: PRIVATE HEALTH INSURANCE | Admitting: *Deleted

## 2013-02-15 DIAGNOSIS — I4891 Unspecified atrial fibrillation: Secondary | ICD-10-CM

## 2013-02-15 DIAGNOSIS — Z7901 Long term (current) use of anticoagulants: Secondary | ICD-10-CM

## 2013-03-21 ENCOUNTER — Encounter (INDEPENDENT_AMBULATORY_CARE_PROVIDER_SITE_OTHER): Payer: 59 | Admitting: General Surgery

## 2013-03-27 ENCOUNTER — Encounter (INDEPENDENT_AMBULATORY_CARE_PROVIDER_SITE_OTHER): Payer: 59 | Admitting: General Surgery

## 2013-05-10 ENCOUNTER — Other Ambulatory Visit: Payer: Self-pay | Admitting: Cardiology

## 2013-05-10 NOTE — Telephone Encounter (Signed)
Partial refill until Nov 11 appointment

## 2013-05-17 ENCOUNTER — Other Ambulatory Visit: Payer: Self-pay

## 2013-05-21 ENCOUNTER — Ambulatory Visit (INDEPENDENT_AMBULATORY_CARE_PROVIDER_SITE_OTHER): Payer: PRIVATE HEALTH INSURANCE | Admitting: General Practice

## 2013-05-21 ENCOUNTER — Ambulatory Visit (INDEPENDENT_AMBULATORY_CARE_PROVIDER_SITE_OTHER): Payer: PRIVATE HEALTH INSURANCE | Admitting: Cardiology

## 2013-05-21 ENCOUNTER — Encounter: Payer: Self-pay | Admitting: Cardiology

## 2013-05-21 VITALS — BP 150/64 | HR 70 | Ht 72.0 in | Wt 370.0 lb

## 2013-05-21 DIAGNOSIS — E669 Obesity, unspecified: Secondary | ICD-10-CM

## 2013-05-21 DIAGNOSIS — I4891 Unspecified atrial fibrillation: Secondary | ICD-10-CM

## 2013-05-21 DIAGNOSIS — I2510 Atherosclerotic heart disease of native coronary artery without angina pectoris: Secondary | ICD-10-CM

## 2013-05-21 DIAGNOSIS — I5040 Unspecified combined systolic (congestive) and diastolic (congestive) heart failure: Secondary | ICD-10-CM

## 2013-05-21 DIAGNOSIS — Z7901 Long term (current) use of anticoagulants: Secondary | ICD-10-CM

## 2013-05-21 LAB — POCT INR: INR: 2.3

## 2013-05-21 MED ORDER — WARFARIN SODIUM 5 MG PO TABS: ORAL_TABLET | ORAL | Status: DC

## 2013-05-21 NOTE — Progress Notes (Signed)
HPI: FU atrial fibrillation and CAD. Transesophageal echocardiogram in November of 2010 revealed normal LV function, moderate left atrial enlargement, mild right atrial enlargement, possible left atrial appendage thrombus and mild mitral regurgitation. Note a TSH was low but free T3 and free T4 were normal. He has been followed by endocrinology for this. We scheduled a cardioversion which was performed on June 18, 2009. The procedure was successful. Patient had PCI of his right coronary artery in August of 2012 in [place]. He had a drug-eluting stent. Last echocardiogram in February of 2013 showed normal LV function. The left atrium was mild to moderately dilated. ABIs in April of 2013 were unremarkable. Since I last saw him in May 2014, he does have some dyspnea on exertion but no orthopnea, PND, chest pain or syncope. Mild pedal edema.   Current Outpatient Prescriptions  Medication Sig Dispense Refill  . ACCU-CHEK AVIVA PLUS test strip       . ACCU-CHEK FASTCLIX LANCETS MISC       . Cholecalciferol (VITAMIN D PO) Take 7,500 Units by mouth daily.      [name] exenatide (BYETTA) 5 MCG/0.02ML SOPN Inject 10 mcg into the skin 2 (two) times daily with a meal.       . furosemide (LASIX) 40 MG tablet Take 80 mg by mouth at bedtime.      . gabapentin (NEURONTIN) 300 MG capsule Take 900 mg by mouth 2 (two) times daily.       . insulin aspart (NOVOLOG FLEXPEN) 100 unit/mL SOLN FlexPen Inject 25 Units into the skin 3 (three) times daily with meals.       . insulin glargine (LANTUS) 100 UNIT/ML injection Inject 100 Units into the skin 2 (two) times daily.      [name] lisinopril-hydrochlorothiazide (PRINZIDE,ZESTORETIC) 20-12.5 MG per tablet Take 1 tablet by mouth daily.       . magnesium oxide (MAG-OX) 400 MG tablet Take 1 tablet (400 mg total) by mouth daily.  30 tablet  12  . metoprolol tartrate (LOPRESSOR) 25 MG tablet Take 25 mg by mouth at bedtime.      . Multiple Vitamin (MULTIVITAMIN WITH  MINERALS) TABS Take 1 tablet by mouth daily.      [name] oxycodone (ROXICODONE) 30 MG immediate release tablet prn      . oxyCODONE-acetaminophen (PERCOCET) 10-325 MG per tablet Take 1 tablet by mouth every 6 (six) hours as needed for pain.  30 tablet  0  . potassium chloride SA (K-DUR,KLOR-CON) 20 MEQ tablet Take 20 mEq by mouth at bedtime.      . rosuvastatin (CRESTOR) 10 MG tablet Take 20 mg by mouth every evening.       [name] spironolactone (ALDACTONE) 25 MG tablet Take 1 tablet (25 mg total) by mouth daily.  30 tablet  12  . warfarin (COUMADIN) 5 MG tablet USE AS DIRECTED  17 tablet  0   No current facility-administered medications for this visit.     Past Medical History  Diagnosis Date  . Diabetes mellitus   . Hypertension   . Rheumatoid arthritis(714.0)   . Neuropathy   . CHF (congestive heart failure)   . History of MI (myocardial infarction)     SILENT MI IN PAST   . CAD (coronary artery disease)   . Atrial fibrillation   . Anemia   . Myocardial infarction     pt stated he had a slient heart attack that showed up on nuclear stress.  [name]  Shortness of breath     with ambulation  . Sleep apnea      SEVERE, NO CPAP USED, DOES NOT KNOW  WHERE SLEEP STUDY DONE  . Sleep apnea     sleep study done last week   dr Tilman Neat  . Pilonidal cyst 01/25/2013    Past Surgical History  Procedure Laterality Date  . Abdominal surgery  AGE 23 MONTHS    BENIGN TUMOR REMOVED  . Debridement  foot  FEW YRS AGO    LEFT FOOT, WOUNDS ALL HEALED NOW  . Coronary angioplasty with stent placement  August 2012    RCA DES - [facility]  . Cataract extraction w/phaco Right 11/15/2012    Procedure: CATARACT EXTRACTION PHACO AND INTRAOCULAR LENS PLACEMENT (IOC);  Surgeon: Shade Flood, MD;  Location: [facility] OR;  Service: Ophthalmology;  Laterality: Right;  . Cataract extraction w/phaco Left 11/29/2012    Procedure: CATARACT EXTRACTION PHACO AND INTRAOCULAR LENS PLACEMENT (IOC);  Surgeon:  Shade Flood, MD;  Location: [facility] OR;  Service: Ophthalmology;  Laterality: Left;  . Foot surgery    . Pilonidal cyst excision N/A 01/08/2013    Procedure: CYST EXCISION PILONIDAL EXTENSIVE;  Surgeon: Axel Filler, MD;  Location: [facility] OR;  Service: General;  Laterality: N/A;    History   Social History  . Marital Status: Single    Spouse Name: N/A    Number of Children: Y  . Years of Education: N/A   Occupational History  . disabled truck driver    Social History Main Topics  . Smoking status: Former Smoker -- 1.00 packs/day for 20 years    Types: Cigarettes    Quit date: 07/13/1983  . Smokeless tobacco: Never Used  . Alcohol Use: No  . Drug Use: No  . Sexual Activity: Not on file   Other Topics Concern  . Not on file   Social History Narrative   Pt is single.   Lives with sister.   Has children.   Was adopted.   Daily cafffiene-2 cups of coffee and 2 sodas per day          ROS: no fevers or chills, productive cough, hemoptysis, dysphasia, odynophagia, melena, hematochezia, dysuria, hematuria, rash, seizure activity, orthopnea, PND, claudication. Remaining systems are negative.  Physical Exam: Well-developed morbidly obese in no acute distress.  Skin is warm and dry.  HEENT is normal.  Neck is supple.  Chest is clear to auscultation with normal expansion.  Cardiovascular exam is regular rate and rhythm.  Abdominal exam nontender or distended. No masses palpated. Extremities show trace edema. neuro grossly intact  ECG sinus rhythm at a rate of 77. Occasional PAC. Right axis deviation.

## 2013-05-21 NOTE — Assessment & Plan Note (Signed)
Patient remains in sinus rhythm. Continue beta blocker. Continue Coumadin. Hemoglobin monitored by primary care.

## 2013-05-21 NOTE — Assessment & Plan Note (Signed)
Blood pressure mildly elevated but he follows this at home and it is typically controlled. Continue present medications. Potassium and renal function monitored by primary care.

## 2013-05-21 NOTE — Assessment & Plan Note (Addendum)
Continue statin. Not on aspirin given need for Coumadin. 

## 2013-05-21 NOTE — Assessment & Plan Note (Signed)
Continue present dose of Lasix. 

## 2013-05-21 NOTE — Assessment & Plan Note (Signed)
Patient counseled on weight loss. 

## 2013-05-21 NOTE — Assessment & Plan Note (Signed)
Continue statin. 

## 2013-05-21 NOTE — Patient Instructions (Signed)
Your physician wants you to follow-up in: 1 year with Dr. Crenshaw. You will receive a reminder letter in the mail two months in advance. If you don't receive a letter, please call our office to schedule the follow-up appointment.  Your physician recommends that you continue on your current medications as directed. Please refer to the Current Medication list given to you today.  

## 2013-06-01 ENCOUNTER — Other Ambulatory Visit: Payer: Self-pay | Admitting: Ophthalmology

## 2013-06-04 ENCOUNTER — Encounter (HOSPITAL_COMMUNITY): Payer: Self-pay | Admitting: *Deleted

## 2013-06-04 ENCOUNTER — Encounter (HOSPITAL_COMMUNITY): Payer: Self-pay

## 2013-06-04 MED ORDER — GATIFLOXACIN 0.5 % OP SOLN
1.0000 [drp] | OPHTHALMIC | Status: AC | PRN
  Administered 2013-06-05 (×3): 1 [drp] via OPHTHALMIC
  Filled 2013-06-04: qty 2.5

## 2013-06-04 MED ORDER — PREDNISOLONE ACETATE 1 % OP SUSP
1.0000 [drp] | OPHTHALMIC | Status: AC
  Administered 2013-06-05 (×3): 1 [drp] via OPHTHALMIC
  Filled 2013-06-04: qty 5

## 2013-06-04 MED ORDER — PHENYLEPHRINE HCL 2.5 % OP SOLN
1.0000 [drp] | OPHTHALMIC | Status: DC | PRN
  Filled 2013-06-04: qty 2

## 2013-06-04 MED ORDER — TETRACAINE HCL 0.5 % OP SOLN
2.0000 [drp] | OPHTHALMIC | Status: AC
  Administered 2013-06-05: 2 [drp] via OPHTHALMIC
  Filled 2013-06-04: qty 2

## 2013-06-04 MED ORDER — CYCLOPENTOLATE HCL 1 % OP SOLN
1.0000 [drp] | OPHTHALMIC | Status: DC | PRN
  Administered 2013-06-05 (×2): 1 [drp] via OPHTHALMIC
  Filled 2013-06-04: qty 2

## 2013-06-04 NOTE — Progress Notes (Signed)
I spoke with Dr Clarisa Kindred about stopping Coumadin, she said patient does not need to stop Coumadin.  Mr [name] informed to continue Coumadin.

## 2013-06-05 ENCOUNTER — Ambulatory Visit (HOSPITAL_COMMUNITY): Payer: PRIVATE HEALTH INSURANCE

## 2013-06-05 ENCOUNTER — Ambulatory Visit (HOSPITAL_COMMUNITY)
Admission: RE | Admit: 2013-06-05 | Discharge: 2013-06-05 | Disposition: A | Discharge status: Home / Self Care | Payer: PRIVATE HEALTH INSURANCE | Source: Ambulatory Visit | Attending: Ophthalmology | Admitting: Ophthalmology

## 2013-06-05 ENCOUNTER — Encounter (HOSPITAL_COMMUNITY): Payer: Self-pay | Admitting: *Deleted

## 2013-06-05 ENCOUNTER — Encounter (HOSPITAL_COMMUNITY): Payer: PRIVATE HEALTH INSURANCE | Admitting: Certified Registered Nurse Anesthetist

## 2013-06-05 ENCOUNTER — Encounter (HOSPITAL_COMMUNITY)
Admission: RE | Disposition: A | Discharge status: Home / Self Care | Payer: Self-pay | Source: Ambulatory Visit | Attending: Ophthalmology

## 2013-06-05 ENCOUNTER — Ambulatory Visit (HOSPITAL_COMMUNITY): Payer: PRIVATE HEALTH INSURANCE | Admitting: Certified Registered Nurse Anesthetist

## 2013-06-05 DIAGNOSIS — H4310 Vitreous hemorrhage, unspecified eye: Secondary | ICD-10-CM | POA: Insufficient documentation

## 2013-06-05 DIAGNOSIS — E11359 Type 2 diabetes mellitus with proliferative diabetic retinopathy without macular edema: Secondary | ICD-10-CM | POA: Insufficient documentation

## 2013-06-05 DIAGNOSIS — Z794 Long term (current) use of insulin: Secondary | ICD-10-CM | POA: Insufficient documentation

## 2013-06-05 DIAGNOSIS — Z7901 Long term (current) use of anticoagulants: Secondary | ICD-10-CM | POA: Insufficient documentation

## 2013-06-05 DIAGNOSIS — I2510 Atherosclerotic heart disease of native coronary artery without angina pectoris: Secondary | ICD-10-CM | POA: Insufficient documentation

## 2013-06-05 DIAGNOSIS — M069 Rheumatoid arthritis, unspecified: Secondary | ICD-10-CM | POA: Insufficient documentation

## 2013-06-05 DIAGNOSIS — I509 Heart failure, unspecified: Secondary | ICD-10-CM | POA: Insufficient documentation

## 2013-06-05 DIAGNOSIS — G4730 Sleep apnea, unspecified: Secondary | ICD-10-CM | POA: Insufficient documentation

## 2013-06-05 DIAGNOSIS — Z79899 Other long term (current) drug therapy: Secondary | ICD-10-CM | POA: Insufficient documentation

## 2013-06-05 DIAGNOSIS — L0591 Pilonidal cyst without abscess: Secondary | ICD-10-CM | POA: Insufficient documentation

## 2013-06-05 DIAGNOSIS — I252 Old myocardial infarction: Secondary | ICD-10-CM | POA: Insufficient documentation

## 2013-06-05 DIAGNOSIS — I4891 Unspecified atrial fibrillation: Secondary | ICD-10-CM | POA: Insufficient documentation

## 2013-06-05 DIAGNOSIS — N183 Chronic kidney disease, stage 3 unspecified: Secondary | ICD-10-CM | POA: Insufficient documentation

## 2013-06-05 DIAGNOSIS — G589 Mononeuropathy, unspecified: Secondary | ICD-10-CM | POA: Insufficient documentation

## 2013-06-05 DIAGNOSIS — I129 Hypertensive chronic kidney disease with stage 1 through stage 4 chronic kidney disease, or unspecified chronic kidney disease: Secondary | ICD-10-CM | POA: Insufficient documentation

## 2013-06-05 DIAGNOSIS — E1139 Type 2 diabetes mellitus with other diabetic ophthalmic complication: Secondary | ICD-10-CM | POA: Insufficient documentation

## 2013-06-05 HISTORY — PX: PARS PLANA VITRECTOMY: SHX2166

## 2013-06-05 HISTORY — DX: Carpal tunnel syndrome, bilateral upper limbs: G56.03

## 2013-06-05 HISTORY — DX: Personal history of other medical treatment: Z92.89

## 2013-06-05 LAB — CBC
Hemoglobin: 11.2 g/dL — ABNORMAL LOW (ref 13.0–17.0)
MCH: 25.7 pg — ABNORMAL LOW (ref 26.0–34.0)
MCHC: 32.9 g/dL (ref 30.0–36.0)
RBC: 4.35 MIL/uL (ref 4.22–5.81)
WBC: 13.3 10*3/uL — ABNORMAL HIGH (ref 4.0–10.5)

## 2013-06-05 LAB — BASIC METABOLIC PANEL
BUN: 13 mg/dL (ref 6–23)
Chloride: 99 mEq/L (ref 96–112)
GFR calc Af Amer: 87 mL/min — ABNORMAL LOW (ref >90)
GFR calc non Af Amer: 75 mL/min — ABNORMAL LOW (ref >90)
Glucose, Bld: 145 mg/dL — ABNORMAL HIGH (ref 70–99)
Potassium: 3.6 mEq/L (ref 3.5–5.1)
Sodium: 135 mEq/L (ref 135–145)

## 2013-06-05 LAB — PROTIME-INR
INR: 1.47 (ref 0.00–1.49)
Prothrombin Time: 17.4 seconds — ABNORMAL HIGH (ref 11.6–15.2)

## 2013-06-05 LAB — GLUCOSE, CAPILLARY: Glucose-Capillary: 148 mg/dL — ABNORMAL HIGH (ref 70–99)

## 2013-06-05 LAB — APTT: aPTT: 35 seconds (ref 24–37)

## 2013-06-05 SURGERY — PARS PLANA VITRECTOMY WITH 23 GAUGE
Anesthesia: Monitor Anesthesia Care | Site: Eye | Laterality: Left | Wound class: Clean

## 2013-06-05 MED ORDER — ACETAZOLAMIDE SODIUM 500 MG IJ SOLR
INTRAMUSCULAR | Status: AC
  Filled 2013-06-05: qty 500

## 2013-06-05 MED ORDER — SODIUM CHLORIDE 0.9 % IV SOLN: INTRAVENOUS | Status: DC

## 2013-06-05 MED ORDER — LIDOCAINE HCL 2 % IJ SOLN
INTRAMUSCULAR | Status: AC
  Filled 2013-06-05: qty 20

## 2013-06-05 MED ORDER — BSS IO SOLN
INTRAOCULAR | Status: DC | PRN
  Administered 2013-06-05: 15 mL via INTRAOCULAR

## 2013-06-05 MED ORDER — SODIUM HYALURONATE 10 MG/ML IO SOLN
INTRAOCULAR | Status: AC
  Filled 2013-06-05: qty 0.85

## 2013-06-05 MED ORDER — EPINEPHRINE HCL 1 MG/ML IJ SOLN
INTRAMUSCULAR | Status: AC
  Filled 2013-06-05: qty 1

## 2013-06-05 MED ORDER — TRIAMCINOLONE ACETONIDE 40 MG/ML IJ SUSP
INTRAMUSCULAR | Status: AC
  Filled 2013-06-05: qty 5

## 2013-06-05 MED ORDER — BSS PLUS IO SOLN
INTRAOCULAR | Status: DC | PRN
  Administered 2013-06-05: 1 via INTRAOCULAR

## 2013-06-05 MED ORDER — DEXAMETHASONE SODIUM PHOSPHATE 10 MG/ML IJ SOLN
INTRAMUSCULAR | Status: AC
  Filled 2013-06-05: qty 1

## 2013-06-05 MED ORDER — FENTANYL CITRATE 0.05 MG/ML IJ SOLN
INTRAMUSCULAR | Status: DC | PRN
  Administered 2013-06-05: 25 ug via INTRAVENOUS

## 2013-06-05 MED ORDER — PHENYLEPHRINE HCL 10 % OP SOLN
1.0000 [drp] | OPHTHALMIC | Status: DC | PRN
  Administered 2013-06-05 (×4): 1 [drp] via OPHTHALMIC
  Filled 2013-06-05 (×2): qty 5

## 2013-06-05 MED ORDER — HYPROMELLOSE (GONIOSCOPIC) 2.5 % OP SOLN
OPHTHALMIC | Status: AC
Start: 2013-06-05 — End: 2013-06-05
  Filled 2013-06-05: qty 15

## 2013-06-05 MED ORDER — BUPIVACAINE HCL (PF) 0.75 % IJ SOLN
INTRAMUSCULAR | Status: AC
  Filled 2013-06-05: qty 10

## 2013-06-05 MED ORDER — EPINEPHRINE HCL 1 MG/ML IJ SOLN
INTRAMUSCULAR | Status: DC | PRN
  Administered 2013-06-05: .3 mg

## 2013-06-05 MED ORDER — BACITRACIN-POLYMYXIN B 500-10000 UNIT/GM OP OINT
TOPICAL_OINTMENT | OPHTHALMIC | Status: AC
  Filled 2013-06-05: qty 3.5

## 2013-06-05 MED ORDER — HYPROMELLOSE (GONIOSCOPIC) 2.5 % OP SOLN
OPHTHALMIC | Status: DC | PRN
  Administered 2013-06-05: 1 [drp] via OPHTHALMIC

## 2013-06-05 MED ORDER — SODIUM CHLORIDE 0.9 % IV SOLN
INTRAVENOUS | Status: DC | PRN
  Administered 2013-06-05 (×2): via INTRAVENOUS

## 2013-06-05 MED ORDER — CEFAZOLIN SUBCONJUNCTIVAL INJECTION 100 MG/0.5 ML
INJECTION | SUBCONJUNCTIVAL | Status: DC | PRN
  Administered 2013-06-05: 200 mg via SUBCONJUNCTIVAL

## 2013-06-05 MED ORDER — DEXAMETHASONE SODIUM PHOSPHATE 10 MG/ML IJ SOLN
INTRAMUSCULAR | Status: DC | PRN
  Administered 2013-06-05: 10 mg

## 2013-06-05 MED ORDER — NA CHONDROIT SULF-NA HYALURON 40-30 MG/ML IO SOLN
INTRAOCULAR | Status: AC
  Filled 2013-06-05: qty 0.5

## 2013-06-05 MED ORDER — LIDOCAINE HCL 2 % IJ SOLN
INTRAMUSCULAR | Status: DC | PRN
  Administered 2013-06-05: 16:00:00 via RETROBULBAR

## 2013-06-05 MED ORDER — TETRACAINE HCL 0.5 % OP SOLN
OPHTHALMIC | Status: AC
  Filled 2013-06-05: qty 2

## 2013-06-05 MED ORDER — CEFAZOLIN SUBCONJUNCTIVAL INJECTION 100 MG/0.5 ML
100.0000 mg | INJECTION | SUBCONJUNCTIVAL | Status: DC
  Filled 2013-06-05: qty 0.5

## 2013-06-05 MED ORDER — MIDAZOLAM HCL 5 MG/5ML IJ SOLN
INTRAMUSCULAR | Status: DC | PRN
  Administered 2013-06-05: 2 mg via INTRAVENOUS

## 2013-06-05 MED ORDER — BACITRACIN-POLYMYXIN B 500-10000 UNIT/GM OP OINT
TOPICAL_OINTMENT | OPHTHALMIC | Status: DC | PRN
  Administered 2013-06-05: 1 via OPHTHALMIC

## 2013-06-05 MED ORDER — PROPOFOL 10 MG/ML IV BOLUS
INTRAVENOUS | Status: DC | PRN
  Administered 2013-06-05: 20 mg via INTRAVENOUS
  Administered 2013-06-05: 30 mg via INTRAVENOUS

## 2013-06-05 MED ORDER — CEFAZOLIN SODIUM-DEXTROSE 2-3 GM-% IV SOLR
2.0000 g | INTRAVENOUS | Status: DC
  Filled 2013-06-05 (×3): qty 50

## 2013-06-05 MED ORDER — BSS PLUS IO SOLN
INTRAOCULAR | Status: AC
  Filled 2013-06-05: qty 500

## 2013-06-05 MED ORDER — TETRACAINE HCL 0.5 % OP SOLN
OPHTHALMIC | Status: DC | PRN
  Administered 2013-06-05: 2 [drp] via OPHTHALMIC

## 2013-06-05 SURGICAL SUPPLY — 74 items
APPLICATOR COTTON TIP 6IN STRL (MISCELLANEOUS) ×2 IMPLANT
APPLICATOR DR MATTHEWS STRL (MISCELLANEOUS) IMPLANT
BAG MINI COLL DRAIN (WOUND CARE) ×2 IMPLANT
BLADE EYE MINI 60D BEAVER (BLADE) IMPLANT
BLADE KERATOME 2.75 (BLADE) IMPLANT
BLADE MVR KNIFE 19G (BLADE) IMPLANT
BLADE STAB KNIFE 15DEG (BLADE) IMPLANT
CANNULA DUAL BORE 23G (CANNULA) IMPLANT
CANNULA VLV SOFT TIP 23GA (OPHTHALMIC) ×2 IMPLANT
CLOTH BEACON ORANGE TIMEOUT ST (SAFETY) ×2 IMPLANT
CORDS BIPOLAR (ELECTRODE) IMPLANT
COVER MAYO STAND STRL (DRAPES) ×2 IMPLANT
DRAPE OPHTHALMIC 77X100 STRL (CUSTOM PROCEDURE TRAY) ×2 IMPLANT
DRAPE POUCH INSTRU U-SHP 10X18 (DRAPES) ×2 IMPLANT
DRSG TEGADERM 4X4.75 (GAUZE/BANDAGES/DRESSINGS) ×4 IMPLANT
ERASER HMR WETFIELD 23G BP (MISCELLANEOUS) IMPLANT
FILTER BLUE MILLIPORE (MISCELLANEOUS) IMPLANT
FORCEPS GRIESHABER ILM 25G A (INSTRUMENTS) IMPLANT
GAS AUTO FILL CONSTEL (OPHTHALMIC)
GAS AUTO FILL CONSTELLATION (OPHTHALMIC) IMPLANT
GLOVE SS BIOGEL STRL SZ 6.5 (GLOVE) ×1 IMPLANT
GLOVE SUPERSENSE BIOGEL SZ 6.5 (GLOVE) ×1
GLOVE SURG SS PI 6.5 STRL IVOR (GLOVE) ×2 IMPLANT
GOWN SRG XL XLNG 56XLVL 4 (GOWN DISPOSABLE) ×1 IMPLANT
GOWN STRL NON-REIN LRG LVL3 (GOWN DISPOSABLE) ×2 IMPLANT
GOWN STRL NON-REIN XL XLG LVL4 (GOWN DISPOSABLE) ×1
HANDLE PNEUMATIC FOR CONSTEL (OPHTHALMIC) IMPLANT
ILLUMINATOR WD SAPHIRE 23G (OPHTHALMIC) IMPLANT
KIT BASIN OR (CUSTOM PROCEDURE TRAY) ×2 IMPLANT
KIT ROOM TURNOVER OR (KITS) ×2 IMPLANT
KNIFE CRESCENT 1.75 EDGEAHEAD (BLADE) IMPLANT
KNIFE GRIESHABER SHARP 2.5MM (MISCELLANEOUS) IMPLANT
MASK EYE SHIELD (GAUZE/BANDAGES/DRESSINGS) ×2 IMPLANT
NEEDLE 18GX1X1/2 (RX/OR ONLY) (NEEDLE) ×2 IMPLANT
NEEDLE 22X1 1/2 (OR ONLY) (NEEDLE) ×2 IMPLANT
NEEDLE 25GX 5/8IN NON SAFETY (NEEDLE) ×2 IMPLANT
NEEDLE 27GX1/2 REG BEVEL ECLIP (NEEDLE) ×2 IMPLANT
NEEDLE FILTER BLUNT 18X 1/2SAF (NEEDLE) ×1
NEEDLE FILTER BLUNT 18X1 1/2 (NEEDLE) ×1 IMPLANT
NEEDLE HYPO 30X.5 LL (NEEDLE) ×4 IMPLANT
NS IRRIG 1000ML POUR BTL (IV SOLUTION) ×2 IMPLANT
PACK FRAGMATOME (OPHTHALMIC) IMPLANT
PACK VITRECTOMY CUSTOM (CUSTOM PROCEDURE TRAY) ×2 IMPLANT
PAD ARMBOARD 7.5X6 YLW CONV (MISCELLANEOUS) ×4 IMPLANT
PAD EYE OVAL STERILE LF (GAUZE/BANDAGES/DRESSINGS) ×2 IMPLANT
PAK VITRECTOMY PIK  23GA (OPHTHALMIC RELATED) ×2 IMPLANT
PENCIL BIPOLAR 25GA STR DISP (OPHTHALMIC RELATED) IMPLANT
PROBE LASER ILLUM FLEX CVD 23G (OPHTHALMIC) ×2 IMPLANT
ROLLS DENTAL (MISCELLANEOUS) ×4 IMPLANT
SCISSORS TIP ADVANCED DSP 25GA (INSTRUMENTS) IMPLANT
SCISSORS TIP GREISHABER 23 VER (OPHTHALMIC) IMPLANT
SCRAPER DIAMOND DUST MEMBRANE (MISCELLANEOUS) IMPLANT
SPEAR EYE SURG WECK-CEL (MISCELLANEOUS) ×6 IMPLANT
SUT ETHILON 10 0 CS140 6 (SUTURE) IMPLANT
SUT ETHILON 4 0 P 3 18 (SUTURE) IMPLANT
SUT ETHILON 5 0 P 3 18 (SUTURE)
SUT ETHILON 9 0 TG140 8 (SUTURE) IMPLANT
SUT NYLON 10.0 BLK (SUTURE) IMPLANT
SUT NYLON ETHILON 5-0 P-3 1X18 (SUTURE) IMPLANT
SUT PLAIN 6 0 TG1408 (SUTURE) IMPLANT
SUT POLY NON ABSORB 10-0 8 STR (SUTURE) IMPLANT
SUT VICRYL 7 0 TG140 8 (SUTURE) IMPLANT
SUT VICRYL ABS 6-0 S29 18IN (SUTURE) IMPLANT
SYR 20CC LL (SYRINGE) ×2 IMPLANT
SYR 5ML LL (SYRINGE) IMPLANT
SYR TB 1ML LUER SLIP (SYRINGE) ×2 IMPLANT
SYRINGE 10CC LL (SYRINGE) IMPLANT
TAPE SURG TRANSPORE 1 IN (GAUZE/BANDAGES/DRESSINGS) ×1 IMPLANT
TAPE SURGICAL TRANSPORE 1 IN (GAUZE/BANDAGES/DRESSINGS) ×1
TOWEL OR 17X24 6PK STRL BLUE (TOWEL DISPOSABLE) ×6 IMPLANT
TUBE EXTENSION HAMMER (TUBING) IMPLANT
WATER STERILE IRR 1000ML POUR (IV SOLUTION) ×2 IMPLANT
WIPE INSTRUMENT ADHESIVE BACK (MISCELLANEOUS) ×2 IMPLANT
WIPE INSTRUMENT VISIWIPE 73X73 (MISCELLANEOUS) ×2 IMPLANT

## 2013-06-05 NOTE — H&P (Signed)
Pre-operative History and Physical for Ophthalmic Surgery  [name] 06/05/2013                  Chief Complaint: Decreased vision left eye  Diagnosis: Vitreous Hemorrhage   Manifest Refraction/Best corrected vision:  CF 13ft BP 174/57  Pulse 73  Temp(Src) 97 F (36.1 C) (Oral)  Resp 22  Ht 6' (1.829 m)  Wt 164.8 kg (363 lb 5.1 oz)  BMI 49.26 kg/m2  SpO2 97%  Eye Exam Findings: Cornea:clear                                   Cataract: post Phaco IOL OS                Funduscopic Exam:  Risks and benefits of proposed Vitrectomy surgery was discussed in detail inclusive of retina tear, retina detachment, vision loss, infection...  The patient indicated understanding our discussion and desires to proceed with planned surgery to improve their visual function.    No Known Allergies  no  Reaction:    Prior to Admission medications  Past Medical History  Diagnosis Date  . Diabetes mellitus   . Hypertension   . Rheumatoid arthritis(714.0)   . Neuropathy   . CHF (congestive heart failure)   . History of MI (myocardial infarction)     SILENT MI IN PAST   . CAD (coronary artery disease)   . Atrial fibrillation   . Anemia   . Myocardial infarction     pt stated he had a slient heart attack that showed up on nuclear stress.  . Sleep apnea      SEVERE, NO CPAP USED, DOES NOT KNOW  WHERE SLEEP STUDY DONE  . Sleep apnea     sleep study done last week   dr Tilman Neat  . Pilonidal cyst 01/25/2013  . Shortness of breath     with ambulation, with exertion  . Chronic kidney disease     CKD stage 3  . Neuropathy   . History of blood transfusion   . Carpal tunnel syndrome, bilateral      ACCU-CHEK AVIVA PLUS test strip  03/25/13  Yes Historical Provider, MD  ACCU-CHEK FASTCLIX LANCETS MISC  05/19/13  Yes Historical Provider, MD  exenatide (BYETTA) 5 MCG/0.02ML SOPN Inject 10 mcg into the skin 2 (two) times daily with a meal.    Yes Historical Provider,  MD  furosemide (LASIX) 40 MG tablet Take 80 mg by mouth at bedtime. 12/26/12  Yes Lewayne Bunting, MD  gabapentin (NEURONTIN) 300 MG capsule Take 900 mg by mouth 2 (two) times daily.    Yes Historical Provider, MD  insulin aspart (NOVOLOG FLEXPEN) 100 unit/mL SOLN FlexPen Inject 25 Units into the skin 3 (three) times daily with meals.    Yes Historical Provider, MD  insulin glargine (LANTUS) 100 UNIT/ML injection Inject 100 Units into the skin 2 (two) times daily.   Yes Historical Provider, MD  lisinopril-hydrochlorothiazide (PRINZIDE,ZESTORETIC) 20-12.5 MG per tablet Take 1 tablet by mouth daily.    Yes Historical Provider, MD  metoprolol tartrate (LOPRESSOR) 25 MG tablet Take 25 mg by mouth at bedtime.   Yes Historical Provider, MD  Multiple Vitamin (MULTIVITAMIN WITH MINERALS) TABS Take 1 tablet by mouth daily.   Yes Historical Provider, MD  oxycodone (ROXICODONE) 30 MG immediate release tablet Take 30 mg by mouth every 6 (six) hours as needed. prn  05/09/13  Yes Historical Provider, MD  potassium chloride SA (K-DUR,KLOR-CON) 20 MEQ tablet Take 20 mEq by mouth at bedtime.   Yes Historical Provider, MD  rosuvastatin (CRESTOR) 10 MG tablet Take 20 mg by mouth every evening.  06/18/11  Yes Lewayne Bunting, MD  spironolactone (ALDACTONE) 25 MG tablet Take 1 tablet (25 mg total) by mouth daily. 12/26/12  Yes Lewayne Bunting, MD  warfarin (COUMADIN) 5 MG tablet Take 5 mg by mouth See admin instructions. Take 5 mg (1tab) on mondays,tues,wed,fri, and Sunday then take 7.5mg (1&1/2tab) Thurs,and saturdays   Yes Historical Provider, MD  Planned Procedure: Pars Plana Vitrectomy and Endolaser                                      Phacoemulsification, Posterior Chamber Intra-ocular Lens  06/05/13 1256  BP:  174/57     Pulse:  73   Temp: 97 F (36.1 C)   Resp:  22 . Diabetes mellitus  . Hypertension  . Rheumatoid  arthritis(714.0)  . Neuropathy  . CHF (congestive heart failure)  . History of MI (myocardial infarction)   SILENT MI IN PAST   . CAD (coronary artery disease)  . Atrial fibrillation  . Anemia  . Myocardial infarction  pt stated he had a slient heart attack that showed up on nuclear stress.  . Sleep apnea   SEVERE, NO CPAP USED, DOES NOT KNOW  WHERE SLEEP STUDY DONE  . Sleep apnea:  sleep study done last week   dr Tilman Neat  . Pilonidal cyst 01/25/2013  . Shortness of breath: with ambulation, with exertion  . Chronic kidney disease:  CKD stage 3  . Neuropathy  . History of blood transfusion  . Carpal tunnel syndrome, bilateral  Past Surgical History  Procedure Laterality Date  . Abdominal surgery  AGE 30 MONTHS  BENIGN TUMOR REMOVED  . Debridement  foot  FEW YRS AGO   LEFT FOOT, WOUNDS ALL HEALED NOW  . Coronary angioplasty with stent placement  August 2012    RCA DES - [facility]  . Cataract extraction w/phaco Right 11/15/2012  Procedure: CYST EXCISION PILONIDAL EXTENSIVE;  Surgeon: Axel Filler, MD;  Location: [facility] OR;  Service: General;  Laterality: N/A;   The following examination is for anesthesia clearance for minimally invasive Ophthalmic surgery. It is primarily to document heart and lung findings and is not intended to elucidate unknown general medical conditions inclusive of abdominal masses, lung lesions, etc.   General Constitution:  obese   Alertness/Orientation:  Person, time place     yes   HEENT:  Eye Findings: Vitreous Hemorrhage                   left eye  Neck: within normal limits  Chest/Lungs: clear  Cardiac: Regular rhythm  Neuro: non-focal  Other pertinent findings: neg  Impression: Vitreous hemorrhage Left Eye  Planned Procedure:  Pars Plana Vitrectomy, Membrane Peeling, possible Fluid Gas Exchange, Endolaser left eye  No contraindications for planned  surgery.     Shade Flood, MD

## 2013-06-05 NOTE — Op Note (Signed)
[name] 06/05/2013 Diagnosis: Vitreous Hemorrhage                     Proliferative Diabetic Retinopathy  Procedure: Pars Plana Vitrectomy and Endolaser Operative Eye:  left eye  Surgeon: Shade Flood Estimated Blood Loss: minimal Specimens for Pathology:  None Complications: none   The  patient was prepped and draped in the usual fashion for ocular surgery on the  left eye .  A solid lid speculum was placed. The conjunctiva was displaced with a cotton tipped applicator at the  4:30  meridian. A trocar/cannula was placed 3.5 mm from the surgical limbus. The cannula was visualized in the vitreous cavity. The infusion line was allowed to run and then clamped when placed at the cannula opening. The line was inserted and secured to the drape with an adhesive strip. Trocar/Cannulas were then placed at the 9:30 and 2:30 meridian. The light pipe and vitreous cutter were inserted into the vitreous cavity and the wide field lens was placed. The patient was noted to have dense vitreous hemorrhage obscuring the Macula and the inferior retina. Core vitrectomy was carried out, along with separation of the posterior vitreous face. Vitrectomy was completed uneventfully with care taken to remove the vitreous up to the vitreous base for 360 degrees. 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   360 degree panretinal photocoagulation was performed treating to the vascular arcades.   The cannulas were removed from the 9:30 and 2:30 positions with concommitant tamponade using a cotton tipped applicator. Subconjunctival injections of Ancef 100mg /0.33ml and Dexamethasone 4mg /77ml were placed in the infero-medial quadrant to avoid proximity to the cannula sites.   The infusion was placed at . The cannula was removed with concomitant tamponade with the cotton tipped  applicator leaving the ocular pressure less than 10 by palpation.  The speculum and drapes were removed and the eye was patched with Polymixin/Bacitracin ophthalmic ointment. An eye shield was placed and the patient was transferred alert and conversant with stable vital signs to the post operative recovery area.  Shade Flood MD

## 2013-06-05 NOTE — Anesthesia Preprocedure Evaluation (Addendum)
Anesthesia Evaluation  Patient identified by MRN, date of birth, ID band Patient awake    Reviewed: Allergy & Precautions, H&P , NPO status , Patient's Chart, lab work & pertinent test results  Airway Mallampati: I TM Distance: >3 FB Neck ROM: Full    Dental  (+) Teeth Intact, Dental Advisory Given and Chipped Chipped molar:   Pulmonary shortness of breath and with exertion, sleep apnea and Continuous Positive Airway Pressure Ventilation , former smoker,  breath sounds clear to auscultation  Pulmonary exam normal       Cardiovascular hypertension, Pt. on medications and Pt. on home beta blockers + CAD, + Past MI and +CHF + dysrhythmias Atrial Fibrillation     Neuro/Psych    GI/Hepatic negative GI ROS, Neg liver ROS,   Endo/Other  diabetes, Type 2, Insulin Dependent  Renal/GU Renal disease     Musculoskeletal  (+) Arthritis -,   Abdominal   Peds  Hematology   Anesthesia Other Findings   Reproductive/Obstetrics                        Anesthesia Physical Anesthesia Plan  ASA: III  Anesthesia Plan: MAC   Post-op Pain Management:    Induction: Intravenous  Airway Management Planned: Simple Face Mask  Additional Equipment:   Intra-op Plan:   Post-operative Plan:   Informed Consent: I have reviewed the patients History and Physical, chart, labs and discussed the procedure including the risks, benefits and alternatives for the proposed anesthesia with the patient or authorized representative who has indicated his/her understanding and acceptance.   Dental advisory given  Plan Discussed with: CRNA, Anesthesiologist and Surgeon  Anesthesia Plan Comments:        Anesthesia Quick Evaluation

## 2013-06-05 NOTE — Anesthesia Procedure Notes (Signed)
Procedure Name: MAC Date/Time: 06/05/2013 4:10 PM Performed by: Angelica Pou Pre-anesthesia Checklist: Patient identified, Patient being monitored, Emergency Drugs available, Timeout performed and Suction available Patient Re-evaluated:Patient Re-evaluated prior to inductionOxygen Delivery Method: Nasal cannula Intubation Type: IV induction

## 2013-06-05 NOTE — Preoperative (Signed)
Beta Blockers   Reason not to administer Beta Blockers:Not Applicable, taken metoprolol within past 24 h

## 2013-06-05 NOTE — Transfer of Care (Signed)
Immediate Anesthesia Transfer of Care Note  Patient: [name]  Procedure(s) Performed: Procedure(s): PARS PLANA VITRECTOMY WITH 23 GAUGE with Endolaser(constellation) (Left)  Patient Location: PACU  Anesthesia Type:MAC  Level of Consciousness: awake, alert , oriented and patient cooperative  Airway & Oxygen Therapy: Patient Spontanous Breathing  Post-op Assessment: Report given to PACU RN, Post -op Vital signs reviewed and stable and Patient moving all extremities  Post vital signs: Reviewed and stable  Complications: No apparent anesthesia complications

## 2013-06-06 ENCOUNTER — Encounter (HOSPITAL_COMMUNITY): Payer: Self-pay | Admitting: Ophthalmology

## 2013-06-06 NOTE — Anesthesia Postprocedure Evaluation (Signed)
Anesthesia Post Note  Patient: [name]  Procedure(s) Performed: Procedure(s) (LRB): PARS PLANA VITRECTOMY WITH 23 GAUGE with Endolaser(constellation) (Left)  Anesthesia type: general  Patient location: PACU  Post pain: Pain level controlled  Post assessment: Patient's Cardiovascular Status Stable  Last Vitals:  Filed Vitals:   06/05/13 1730  BP: 142/72  Pulse: 71  Temp:   Resp:     Post vital signs: Reviewed and stable  Level of consciousness: sedated  Complications: No apparent anesthesia complications

## 2013-06-14 ENCOUNTER — Ambulatory Visit (INDEPENDENT_AMBULATORY_CARE_PROVIDER_SITE_OTHER): Payer: 59 | Admitting: Surgery

## 2013-06-14 ENCOUNTER — Encounter (INDEPENDENT_AMBULATORY_CARE_PROVIDER_SITE_OTHER): Payer: Self-pay | Admitting: Surgery

## 2013-06-14 VITALS — BP 172/74 | HR 93 | Temp 99.3°F | Resp 16 | Ht 72.0 in | Wt 369.0 lb

## 2013-06-14 DIAGNOSIS — L0591 Pilonidal cyst without abscess: Secondary | ICD-10-CM

## 2013-06-14 NOTE — Progress Notes (Signed)
CENTRAL  SURGERY  Paul Kin, MD,  FACS [address] [place]  [address] [place], [place]    [address] Phone:  [phone] FAX:  [phone]   Re:   [name] DOB:   [birth year] MRN:   [id number]  Urgent Office  ASSESSMENT AND PLAN: 1.  History of pilonidal infection/cyst - pain at pilonidal cyst excision area, but no obvious infection or cause for the pain.  Excision by Dr. Derrell Lolling in 01/08/2013  He does have a slight tear in the skin.   He will see Dr. Derrell Lolling back in about one week to check this area.  I don't think that he needs antibiotics at this time.  I suggested he try sitz baths.  And he thought that he could get in and out of the bath tub.  He also asked for Percocet, but I was hesitant to give him any since I didn't find much on exam.  I offered Tramadol, but he declined. 2.  On coumadin  INR - 1.47 - 06/05/2013 3.  History of A Fib - but in sinus rhythm now  Sees Dr. Velta Addison 4.  IDDM   Sees Dr. Ester Rink 5.  Morbid obesity 6.  OSA  HISTORY OF PRESENT ILLNESS: Chief Complaint  Patient presents with  . Follow-up    perirectal abs    [name] is a 62 y.o. (DOB: 1951-04-25)  white  male who is a patient of KOIRALA,DIBAS, MD and comes to urgent office today for pain in his pilonidal wound.  He had an excision of a pilonidal infection 01/08/2013 by Dr. Derrell Lolling.  The wound had been healing and he last saw Dr. Derrell Lolling on 01/25/2013.  It is unclear whether he was supposed to be seen again or he failed follow up. He fell over Thanksgiving and struck his abdomen and right chest.  It does not sound like he hit his buttocks.   His pilonidal cyst area started hurting about 6 days ago - but it has not drained.  He says that it is throbbing.  He has had no fever.  Past Medical History  Diagnosis Date  . Diabetes mellitus   . Hypertension   . Rheumatoid arthritis(714.0)   . Neuropathy   . CHF (congestive heart failure)   . History of MI  (myocardial infarction)     SILENT MI IN PAST   . CAD (coronary artery disease)   . Atrial fibrillation   . Anemia   . Myocardial infarction     pt stated he had a slient heart attack that showed up on nuclear stress.  . Sleep apnea      SEVERE, NO CPAP USED, DOES NOT KNOW  WHERE SLEEP STUDY DONE  . Sleep apnea     sleep study done last week   dr Tilman Neat  . Pilonidal cyst 01/25/2013  . Shortness of breath     with ambulation, with exertion  . Chronic kidney disease     CKD stage 3  . Neuropathy   . History of blood transfusion   . Carpal tunnel syndrome, bilateral     Current Outpatient Prescriptions  Medication Sig Dispense Refill  . ACCU-CHEK AVIVA PLUS test strip       . ACCU-CHEK FASTCLIX LANCETS MISC       . furosemide (LASIX) 40 MG tablet Take 80 mg by mouth at bedtime.      . gabapentin (NEURONTIN) 300 MG capsule Take 900 mg by mouth 2 (two)  times daily.       . insulin aspart (NOVOLOG FLEXPEN) 100 unit/mL SOLN FlexPen Inject 25 Units into the skin 3 (three) times daily with meals.       . insulin glargine (LANTUS) 100 UNIT/ML injection Inject 100 Units into the skin 2 (two) times daily.      [name] lisinopril-hydrochlorothiazide (PRINZIDE,ZESTORETIC) 20-12.5 MG per tablet Take 1 tablet by mouth daily.       . metoprolol tartrate (LOPRESSOR) 25 MG tablet Take 25 mg by mouth at bedtime.      . Multiple Vitamin (MULTIVITAMIN WITH MINERALS) TABS Take 1 tablet by mouth daily.      . potassium chloride SA (K-DUR,KLOR-CON) 20 MEQ tablet Take 20 mEq by mouth at bedtime.      . rosuvastatin (CRESTOR) 10 MG tablet Take 20 mg by mouth every evening.       [name] spironolactone (ALDACTONE) 25 MG tablet Take 1 tablet (25 mg total) by mouth daily.  30 tablet  12  . warfarin (COUMADIN) 5 MG tablet Take 5 mg by mouth See admin instructions. Take 5 mg (1tab) on mondays,tues,wed,fri, and Sunday then take 7.5mg (1&1/2tab) Thurs,and saturdays       No current facility-administered medications for this  visit.     SOCIAL HISTORY: Comes by self to office.  PHYSICAL EXAM: BP 172/74  Pulse 93  Temp(Src) 99.3 F (37.4 C) (Temporal)  Resp 16  Ht 6' (1.829 m)  Wt 369 lb (167.377 kg)  BMI 50.03 kg/m2  General:  Large male with large abdomen Buttocks/rectum:  His pilonidal cyst wound is healed.  He has a little crack in his skin, but I do not see any cellulitis or inflammation.  I tried to probe the area with a Q tip, but found obvious infection.  Though he is having symptoms, I do not find a physical correlation.  DATA REVIEWED: Epic notes.  Paul Kin, MD,  [facility] [address] [place]  [address]   [place], [place]    [address] Phone:  [phone] FAX:  [phone]

## 2013-06-19 ENCOUNTER — Encounter (INDEPENDENT_AMBULATORY_CARE_PROVIDER_SITE_OTHER): Payer: 59 | Admitting: General Surgery

## 2013-07-13 ENCOUNTER — Other Ambulatory Visit: Payer: Self-pay | Admitting: Cardiology

## 2013-07-17 ENCOUNTER — Ambulatory Visit (INDEPENDENT_AMBULATORY_CARE_PROVIDER_SITE_OTHER): Payer: PRIVATE HEALTH INSURANCE | Admitting: *Deleted

## 2013-07-17 DIAGNOSIS — Z7901 Long term (current) use of anticoagulants: Secondary | ICD-10-CM

## 2013-07-17 DIAGNOSIS — I4891 Unspecified atrial fibrillation: Secondary | ICD-10-CM

## 2013-07-17 LAB — PROTIME-INR
INR: 8 ratio — AB (ref 0.8–1.0)
Prothrombin Time: 80.8 s (ref 10.2–12.4)

## 2013-07-17 LAB — POCT INR: INR: 7.3

## 2013-07-17 MED ORDER — WARFARIN SODIUM 5 MG PO TABS: 5.0000 mg | ORAL_TABLET | ORAL | Status: DC

## 2013-07-19 ENCOUNTER — Telehealth: Payer: Self-pay | Admitting: Internal Medicine

## 2013-07-19 NOTE — Telephone Encounter (Signed)
LVOM FOR PT TO RETURN CALL IN RE TO REFERRAL.  °

## 2013-07-20 ENCOUNTER — Telehealth: Payer: Self-pay | Admitting: Internal Medicine

## 2013-07-20 ENCOUNTER — Emergency Department (INDEPENDENT_AMBULATORY_CARE_PROVIDER_SITE_OTHER): Payer: PRIVATE HEALTH INSURANCE

## 2013-07-20 ENCOUNTER — Encounter (HOSPITAL_COMMUNITY): Payer: Self-pay | Admitting: Emergency Medicine

## 2013-07-20 ENCOUNTER — Emergency Department (INDEPENDENT_AMBULATORY_CARE_PROVIDER_SITE_OTHER)
Admission: EM | Admit: 2013-07-20 | Discharge: 2013-07-20 | Disposition: A | Discharge status: Home / Self Care | Payer: PRIVATE HEALTH INSURANCE | Source: Home / Self Care

## 2013-07-20 DIAGNOSIS — S2341XA Sprain of ribs, initial encounter: Secondary | ICD-10-CM

## 2013-07-20 DIAGNOSIS — W19XXXA Unspecified fall, initial encounter: Secondary | ICD-10-CM

## 2013-07-20 MED ORDER — HYDROMORPHONE HCL 1 MG/ML IJ SOLN
2.0000 mg | Freq: Once | INTRAMUSCULAR | Status: AC
  Administered 2013-07-20: 2 mg via INTRAMUSCULAR

## 2013-07-20 MED ORDER — HYDROMORPHONE HCL PF 1 MG/ML IJ SOLN
INTRAMUSCULAR | Status: AC
  Filled 2013-07-20: qty 2

## 2013-07-20 NOTE — Telephone Encounter (Signed)
S/W PT AND GVE NP APPT 01/20 @ 1:30 W/DR. MOHAMED REFERRING DR. [name] DX- ELEVATED WBC WELCOME PACKET MAILED.

## 2013-07-20 NOTE — Discharge Instructions (Signed)
Rib Fracture  A rib fracture is a break or crack in one of the bones of the ribs. The ribs are a group of long, curved bones that wrap around your chest and attach to your spine. They protect your lungs and other organs in the chest cavity. A broken or cracked rib is often painful, but most do not cause other problems. Most rib fractures heal on their own over time. However, rib fractures can be more serious if multiple ribs are broken or if broken ribs move out of place and push against other structures.  CAUSES   · A direct blow to the chest. For example, this could happen during contact sports, a car accident, or a fall against a hard object.  · Repetitive movements with high force, such as pitching a baseball or having severe coughing spells.  SYMPTOMS   · Pain when you breathe in or cough.  · Pain when someone presses on the injured area.  DIAGNOSIS   Your caregiver will perform a physical exam. Various imaging tests may be ordered to confirm the diagnosis and to look for related injuries. These tests may include a chest X-ray, computed tomography (CT), magnetic resonance imaging (MRI), or a bone scan.  TREATMENT   Rib fractures usually heal on their own in 1 3 months. The longer healing period is often associated with a continued cough or other aggravating activities. During the healing period, pain control is very important. Medication is usually given to control pain. Hospitalization or surgery may be needed for more severe injuries, such as those in which multiple ribs are broken or the ribs have moved out of place.   HOME CARE INSTRUCTIONS   · Avoid strenuous activity and any activities or movements that cause pain. Be careful during activities and avoid bumping the injured rib.  · Gradually increase activity as directed by your caregiver.  · Only take over-the-counter or prescription medications as directed by your caregiver. Do not take other medications without asking your caregiver first.  · Apply ice  to the injured area for the first 1 2 days after you have been treated or as directed by your caregiver. Applying ice helps to reduce inflammation and pain.  · Put ice in a plastic bag.  · Place a towel between your skin and the bag.    · Leave the ice on for 15 20 minutes at a time, every 2 hours while you are awake.  · Perform deep breathing as directed by your caregiver. This will help prevent pneumonia, which is a common complication of a broken rib. Your caregiver may instruct you to:  · Take deep breaths several times a day.  · Try to cough several times a day, holding a pillow against the injured area.  · Use a device called an incentive spirometer to practice deep breathing several times a day.  · Drink enough fluids to keep your urine clear or pale yellow. This will help you avoid constipation.    · Do not wear a rib belt or binder. These restrict breathing, which can lead to pneumonia.    SEEK IMMEDIATE MEDICAL CARE IF:   · You have a fever.    · You have difficulty breathing or shortness of breath.    · You develop a continual cough, or you cough up thick or bloody sputum.  · You feel sick to your stomach (nausea), throw up (vomit), or have abdominal pain.    · You have worsening pain not controlled with medications.      MAKE SURE YOU:  · Understand these instructions.  · Will watch your condition.  · Will get help right away if you are not doing well or get worse.  Document Released: 06/28/2005 Document Revised: 02/28/2013 Document Reviewed: 08/30/2012  [employer]® Patient Information ©2014 [employer].

## 2013-07-20 NOTE — ED Notes (Signed)
Pt  Advised  Not to  Drive  He  Says  His  Sister  Will  Take  Him  home

## 2013-07-20 NOTE — Telephone Encounter (Signed)
C/D 07/20/13 for appt. 07/31/13 °

## 2013-07-20 NOTE — ED Provider Notes (Signed)
CSN: [id number]     Arrival date & time 07/20/13  1041 History   None    Chief Complaint  Patient presents with  . Fall   (Consider location/radiation/quality/duration/timing/severity/associated sxs/prior Treatment) HPI Comments: 63 year old male presents complaining of tooth 3 pain. Yesterday, he slipped on a patch of ice and fell into his stairs. He cracked a tooth on the upper right rear molar, and he hit his right flank/rib cage down onto a step. He had immediate pain and felt a pop in his ribs. He still has fairly severe pain when he takes a deep breath, although he does not feel particularly short of breath. He is also having pain in the tooth that he chipped, he will be able to get in to see his dentist in about 2 weeks. Denies any abdominal pain, NVD  Patient is a 63 y.o. male presenting with fall.  Fall Associated symptoms include chest pain (right lateral ribcage) and shortness of breath. Pertinent negatives include no abdominal pain.    Past Medical History  Diagnosis Date  . Diabetes mellitus   . Hypertension   . Rheumatoid arthritis(714.0)   . Neuropathy   . CHF (congestive heart failure)   . History of MI (myocardial infarction)     SILENT MI IN PAST   . CAD (coronary artery disease)   . Atrial fibrillation   . Anemia   . Myocardial infarction     pt stated he had a slient heart attack that showed up on nuclear stress.  . Sleep apnea      SEVERE, NO CPAP USED, DOES NOT KNOW  WHERE SLEEP STUDY DONE  . Sleep apnea     sleep study done last week   dr Tilman Neat  . Pilonidal cyst 01/25/2013  . Shortness of breath     with ambulation, with exertion  . Chronic kidney disease     CKD stage 3  . Neuropathy   . History of blood transfusion   . Carpal tunnel syndrome, bilateral    Past Surgical History  Procedure Laterality Date  . Abdominal surgery  AGE 65 MONTHS    BENIGN TUMOR REMOVED  . Debridement  foot  FEW YRS AGO    LEFT FOOT, WOUNDS ALL HEALED NOW  . Coronary  angioplasty with stent placement  August 2012    RCA DES - [facility]  . Cataract extraction w/phaco Right 11/15/2012    Procedure: CATARACT EXTRACTION PHACO AND INTRAOCULAR LENS PLACEMENT (IOC);  Surgeon: Shade Flood, MD;  Location: [facility] OR;  Service: Ophthalmology;  Laterality: Right;  . Cataract extraction w/phaco Left 11/29/2012    Procedure: CATARACT EXTRACTION PHACO AND INTRAOCULAR LENS PLACEMENT (IOC);  Surgeon: Shade Flood, MD;  Location: [facility] OR;  Service: Ophthalmology;  Laterality: Left;  . Foot surgery    . Pilonidal cyst excision N/A 01/08/2013    Procedure: CYST EXCISION PILONIDAL EXTENSIVE;  Surgeon: Axel Filler, MD;  Location: [facility] OR;  Service: General;  Laterality: N/A;  . Foreign body removal Right 2014    Right heel splinter removal   . Eye surgery Bilateral     cataract  . Pars plana vitrectomy Left 06/05/2013    Procedure: PARS PLANA VITRECTOMY WITH 23 GAUGE with Endolaser(constellation);  Surgeon: Shade Flood, MD;  Location: [facility] OR;  Service: Ophthalmology;  Laterality: Left;   Family History  Problem Relation Age of Onset  . Other Other     PT ADOPTED   History  Substance Use Topics  .  Smoking status: Former Smoker -- 1.00 packs/day for 20 years    Types: Cigarettes    Quit date: 07/13/1983  . Smokeless tobacco: Never Used  . Alcohol Use: No    Review of Systems  Constitutional: Negative for fever, chills and fatigue.  HENT: Positive for dental problem. Negative for sore throat.   Eyes: Negative for visual disturbance.  Respiratory: Positive for shortness of breath. Negative for cough.   Cardiovascular: Positive for chest pain (right lateral ribcage). Negative for palpitations and leg swelling.  Gastrointestinal: Negative for nausea, vomiting, abdominal pain, diarrhea and constipation.  Genitourinary: Negative for dysuria, urgency, frequency and hematuria.  Musculoskeletal: Negative for arthralgias, myalgias, neck pain and neck  stiffness.  Skin: Negative for rash.  Neurological: Negative for dizziness, weakness and light-headedness.    Allergies  Review of patient's allergies indicates no known allergies.  Home Medications   Current Outpatient Rx  Name  Route  Sig  Dispense  Refill  . ACCU-CHEK AVIVA PLUS test strip               . ACCU-CHEK FASTCLIX LANCETS MISC               . furosemide (LASIX) 40 MG tablet   Oral   Take 80 mg by mouth at bedtime.         . gabapentin (NEURONTIN) 300 MG capsule   Oral   Take 900 mg by mouth 2 (two) times daily.          . insulin aspart (NOVOLOG FLEXPEN) 100 unit/mL SOLN FlexPen   Subcutaneous   Inject 25 Units into the skin 3 (three) times daily with meals.          . insulin glargine (LANTUS) 100 UNIT/ML injection   Subcutaneous   Inject 100 Units into the skin 2 (two) times daily.         [name] lisinopril-hydrochlorothiazide (PRINZIDE,ZESTORETIC) 20-12.5 MG per tablet   Oral   Take 1 tablet by mouth daily.          . metoprolol tartrate (LOPRESSOR) 25 MG tablet   Oral   Take 25 mg by mouth at bedtime.         . Multiple Vitamin (MULTIVITAMIN WITH MINERALS) TABS   Oral   Take 1 tablet by mouth daily.         . potassium chloride SA (K-DUR,KLOR-CON) 20 MEQ tablet   Oral   Take 20 mEq by mouth at bedtime.         . rosuvastatin (CRESTOR) 10 MG tablet   Oral   Take 20 mg by mouth every evening.          [name] spironolactone (ALDACTONE) 25 MG tablet   Oral   Take 1 tablet (25 mg total) by mouth daily.   30 tablet   12   . warfarin (COUMADIN) 5 MG tablet   Oral   Take 1 tablet (5 mg total) by mouth as directed.   40 tablet   1    There were no vitals taken for this visit. Physical Exam  Nursing note and vitals reviewed. Constitutional: He is oriented to person, place, and time. He appears well-developed and well-nourished. He appears distressed (in pain ).  Morbid obesity   HENT:  Head: Normocephalic.  Mouth/Throat:      Pulmonary/Chest: Effort normal. No respiratory distress. He exhibits tenderness (right lateral chest wall).  Lateral ribcage pain with AP compression of the chest   Neurological: He is  alert and oriented to person, place, and time. Coordination normal.  Skin: Skin is warm and dry. No rash noted. He is not diaphoretic.  Psychiatric: He has a normal mood and affect. Judgment normal.    ED Course  Procedures (including critical care time) Labs Review Labs Reviewed - No data to display Imaging Review Dg Ribs Unilateral W/chest Right  07/20/2013   CLINICAL DATA:  63 year old male status post fall down stairs with right side rib pain. Initial encounter.  EXAM: RIGHT RIBS AND CHEST - 3+ VIEW  COMPARISON:  06/05/2013 and earlier.  FINDINGS: Slightly lower lung volumes. Stable cardiac size and mediastinal contours. Chronic Cardiac size at the upper limits of normal. No pneumothorax or pleural effusion. No acute or confluent pulmonary opacity.  Bone mineralization is within normal limits. Large body habitus decreasing bone detail of the lower ribs. Rib marker placed at the level of the lateral right 11th / 12th ribs. No displaced rib fracture identified. Flowing osteophytes in the spine.  IMPRESSION: 1. Lower lung volumes.  No acute cardiopulmonary abnormality. 2. No displaced right rib fracture identified. Suboptimal bone detail in the area of clinical concern due to large body habitus.   Electronically Signed   By: Augusto Gamble M.D.   On: 07/20/2013 12:42    Pt takes 30 mg oxycodone QID at baseline for pain.  Given 2 mg dilaudid IM    MDM   1. Fall, initial encounter   2. Rib injury, initial encounter    No definite fracture seen on the x-ray, this is likely due to body habitus. Symptoms of intra-abdominal injury, he'll keep a close eye on his condition and return to the emergency department if he has any worsening. Given an incentive spirometer and told to use this as much as possible. He has pain  medicine at home that he will take.    Graylon Good, PA-C 07/20/13 1306

## 2013-07-20 NOTE — ED Notes (Signed)
Pt  Reports      He  Felled  On  Ice  yest  And  injuured  His  r  Rib  Cage        He   Reports    Pain  r  Rib  Cage     When he  Takes  A  Deep  Breath        -  He  Did  Not  Black out  He  Remembers  Everything         He  Has  Pain on palpation        He   Is  Sitting upright on exam table  Speaking in  Complete  sentances          He  States  He   Has  A  Dental injury  As  Well

## 2013-07-22 ENCOUNTER — Encounter (HOSPITAL_COMMUNITY): Payer: Self-pay | Admitting: Emergency Medicine

## 2013-07-22 ENCOUNTER — Emergency Department (HOSPITAL_COMMUNITY)
Admission: EM | Admit: 2013-07-22 | Discharge: 2013-07-22 | Disposition: A | Discharge status: Home / Self Care | Payer: PRIVATE HEALTH INSURANCE | Attending: Emergency Medicine | Admitting: Emergency Medicine

## 2013-07-22 DIAGNOSIS — G8911 Acute pain due to trauma: Secondary | ICD-10-CM | POA: Insufficient documentation

## 2013-07-22 DIAGNOSIS — R0789 Other chest pain: Secondary | ICD-10-CM | POA: Insufficient documentation

## 2013-07-22 DIAGNOSIS — S032XXD Dislocation of tooth, subsequent encounter: Secondary | ICD-10-CM

## 2013-07-22 DIAGNOSIS — Z8739 Personal history of other diseases of the musculoskeletal system and connective tissue: Secondary | ICD-10-CM | POA: Insufficient documentation

## 2013-07-22 DIAGNOSIS — G589 Mononeuropathy, unspecified: Secondary | ICD-10-CM | POA: Insufficient documentation

## 2013-07-22 DIAGNOSIS — I129 Hypertensive chronic kidney disease with stage 1 through stage 4 chronic kidney disease, or unspecified chronic kidney disease: Secondary | ICD-10-CM | POA: Insufficient documentation

## 2013-07-22 DIAGNOSIS — Z794 Long term (current) use of insulin: Secondary | ICD-10-CM | POA: Insufficient documentation

## 2013-07-22 DIAGNOSIS — S20212D Contusion of left front wall of thorax, subsequent encounter: Secondary | ICD-10-CM

## 2013-07-22 DIAGNOSIS — Z872 Personal history of diseases of the skin and subcutaneous tissue: Secondary | ICD-10-CM | POA: Insufficient documentation

## 2013-07-22 DIAGNOSIS — Z862 Personal history of diseases of the blood and blood-forming organs and certain disorders involving the immune mechanism: Secondary | ICD-10-CM | POA: Insufficient documentation

## 2013-07-22 DIAGNOSIS — I509 Heart failure, unspecified: Secondary | ICD-10-CM | POA: Insufficient documentation

## 2013-07-22 DIAGNOSIS — N183 Chronic kidney disease, stage 3 unspecified: Secondary | ICD-10-CM | POA: Insufficient documentation

## 2013-07-22 DIAGNOSIS — Z87891 Personal history of nicotine dependence: Secondary | ICD-10-CM | POA: Insufficient documentation

## 2013-07-22 DIAGNOSIS — I4891 Unspecified atrial fibrillation: Secondary | ICD-10-CM | POA: Insufficient documentation

## 2013-07-22 DIAGNOSIS — Z9861 Coronary angioplasty status: Secondary | ICD-10-CM | POA: Insufficient documentation

## 2013-07-22 DIAGNOSIS — I2510 Atherosclerotic heart disease of native coronary artery without angina pectoris: Secondary | ICD-10-CM | POA: Insufficient documentation

## 2013-07-22 DIAGNOSIS — E119 Type 2 diabetes mellitus without complications: Secondary | ICD-10-CM | POA: Insufficient documentation

## 2013-07-22 DIAGNOSIS — Z79899 Other long term (current) drug therapy: Secondary | ICD-10-CM | POA: Insufficient documentation

## 2013-07-22 DIAGNOSIS — Z7901 Long term (current) use of anticoagulants: Secondary | ICD-10-CM | POA: Insufficient documentation

## 2013-07-22 DIAGNOSIS — I252 Old myocardial infarction: Secondary | ICD-10-CM | POA: Insufficient documentation

## 2013-07-22 DIAGNOSIS — K089 Disorder of teeth and supporting structures, unspecified: Secondary | ICD-10-CM | POA: Insufficient documentation

## 2013-07-22 MED ORDER — OXYCODONE HCL 5 MG PO TABS
5.0000 mg | ORAL_TABLET | ORAL | Status: DC | PRN
Start: 2013-07-22 — End: 2014-01-22

## 2013-07-22 MED ORDER — HYDROMORPHONE HCL PF 1 MG/ML IJ SOLN
1.0000 mg | Freq: Once | INTRAMUSCULAR | Status: AC
  Administered 2013-07-22: 1 mg via INTRAMUSCULAR
  Filled 2013-07-22: qty 1

## 2013-07-22 NOTE — ED Notes (Signed)
Pt states that he slipped on Friday and went to grab step and heard something pop in his rib area and broke tooth on the right.  Pt was seen for it at urgent and referred to come here if no improvement.  Hurts to take deep breath.

## 2013-07-22 NOTE — ED Provider Notes (Signed)
Medical screening examination/treatment/procedure(s) were performed by non-physician practitioner and as supervising physician I was immediately available for consultation/collaboration.  EKG Interpretation   None        Juliet Rude. Rubin Payor, MD 07/22/13 1551

## 2013-07-22 NOTE — Discharge Instructions (Signed)
Tooth Injuries A tooth has many layers. The outside is the enamel. The enamel is the white part. Under the enamel is the dentin. Under the dentin is the pulp, the nerves, and the blood vessels. The top part is the crown. The bottom part is the root. Three common tooth injuries include:  Breaks (fractures) usually split the tooth into 2 or more parts.  Shifts of the tooth at the level of the root.  A tooth that comes out. HOME CARE  Minor breaks usually do not require seeing a dentist right away.  Handle tooth fragments by the outside layer. Bring these fragments to the dentist.  A tooth can also be loosened by injury and show no sign of a problem. See a dentist for an X-ray. The X-ray will look for problems below the gum line.  Gently biting into gauze or a towel will help control bleeding. An exposed nerve requires a dental exam and care. Getting help right away is not needed if the pain is controlled.  Only take medicine as told by your dentist.  Doreatha Martin all medicine as told by your dentist.  Avoid eating solid foods and see a dentist within 24 hours. GET HELP RIGHT AWAY IF:   The pain is becoming worse rather than better.  The pain does not get better with medicine.  You have increased puffiness (swelling) or redness in your face near the injured tooth. MAKE SURE YOU:  Understand these instructions.  Will watch your condition.  Will get help right away if you are not doing well or get worse. Document Released: 12/16/2009 Document Revised: 09/20/2011 Document Reviewed: 12/16/2009 [facility] Patient Information 2014 [place], [place].

## 2013-07-22 NOTE — ED Provider Notes (Signed)
CSN: [id number]     Arrival date & time 07/22/13  1105 History   First MD Initiated Contact with Patient 07/22/13 1423     Chief Complaint  Patient presents with  . right rib pain    (Consider location/radiation/quality/duration/timing/severity/associated sxs/prior Treatment) HPI Comments: Patient is 63 year old male with PMHx of DM, HTN, RA, neuropathy who reports that he slipped on Friday and went to grab the railing when he felt a pop in his right chest and clenched his teeth.  He reports that he cracked the left upper 3rd molar.  He is continuing to have pain to the tooth and right lateral rib area.  He states that he was seen at an [facility] and got an x-ray which was negative for fracture.  He reports he is out of his pain medication and needs pain control.  He denies fever, chills, worsening shortness of breath, nausea, vomiting, hemoptysis, increase in the pain.  Patient is a 63 y.o. male presenting with chest pain. The history is provided by the patient. No language interpreter was used.  Chest Pain Pain location:  R chest Pain quality: sharp and stabbing   Pain radiates to:  Does not radiate Pain radiates to the back: no   Pain severity:  Moderate Onset quality:  Sudden Duration:  2 days Timing:  Constant Progression:  Unchanged Chronicity:  New Context: breathing, movement and trauma   Context: not lifting, not at rest and no stress   Relieved by:  Nothing Worsened by:  Nothing tried Ineffective treatments:  None tried Associated symptoms: no abdominal pain, no altered mental status, no anxiety, no back pain, no cough, no diaphoresis, no dizziness, no fever, no headache, no heartburn, no lower extremity edema, no nausea, no numbness, no orthopnea, no palpitations, no shortness of breath, no syncope, not vomiting and no weakness     Past Medical History  Diagnosis Date  . Diabetes mellitus   . Hypertension   . Rheumatoid arthritis(714.0)   . Neuropathy   . CHF (congestive  heart failure)   . History of MI (myocardial infarction)     SILENT MI IN PAST   . CAD (coronary artery disease)   . Atrial fibrillation   . Anemia   . Myocardial infarction     pt stated he had a slient heart attack that showed up on nuclear stress.  . Sleep apnea      SEVERE, NO CPAP USED, DOES NOT KNOW  WHERE SLEEP STUDY DONE  . Sleep apnea     sleep study done last week   dr Tilman Neat  . Pilonidal cyst 01/25/2013  . Shortness of breath     with ambulation, with exertion  . Chronic kidney disease     CKD stage 3  . Neuropathy   . History of blood transfusion   . Carpal tunnel syndrome, bilateral    Past Surgical History  Procedure Laterality Date  . Abdominal surgery  AGE 66 MONTHS    BENIGN TUMOR REMOVED  . Debridement  foot  FEW YRS AGO    LEFT FOOT, WOUNDS ALL HEALED NOW  . Coronary angioplasty with stent placement  August 2012    RCA DES - [facility]  . Cataract extraction w/phaco Right 11/15/2012    Procedure: CATARACT EXTRACTION PHACO AND INTRAOCULAR LENS PLACEMENT (IOC);  Surgeon: Shade Flood, MD;  Location: [facility] OR;  Service: Ophthalmology;  Laterality: Right;  . Cataract extraction w/phaco Left 11/29/2012    Procedure:  CATARACT EXTRACTION PHACO AND INTRAOCULAR LENS PLACEMENT (IOC);  Surgeon: Shade Flood, MD;  Location: [facility] OR;  Service: Ophthalmology;  Laterality: Left;  . Foot surgery    . Pilonidal cyst excision N/A 01/08/2013    Procedure: CYST EXCISION PILONIDAL EXTENSIVE;  Surgeon: Axel Filler, MD;  Location: [facility] OR;  Service: General;  Laterality: N/A;  . Foreign body removal Right 2014    Right heel splinter removal   . Eye surgery Bilateral     cataract  . Pars plana vitrectomy Left 06/05/2013    Procedure: PARS PLANA VITRECTOMY WITH 23 GAUGE with Endolaser(constellation);  Surgeon: Shade Flood, MD;  Location: [facility] OR;  Service: Ophthalmology;  Laterality: Left;   Family History  Problem Relation Age of Onset  . Other Other      PT ADOPTED   History  Substance Use Topics  . Smoking status: Former Smoker -- 1.00 packs/day for 20 years    Types: Cigarettes    Quit date: 07/13/1983  . Smokeless tobacco: Never Used  . Alcohol Use: No    Review of Systems  Constitutional: Negative for fever and diaphoresis.  HENT: Positive for dental problem.   Respiratory: Negative for cough and shortness of breath.   Cardiovascular: Positive for chest pain. Negative for palpitations, orthopnea and syncope.  Gastrointestinal: Negative for heartburn, nausea, vomiting and abdominal pain.  Musculoskeletal: Negative for back pain.  Neurological: Negative for dizziness, weakness, numbness and headaches.  All other systems reviewed and are negative.    Allergies  Review of patient's allergies indicates no known allergies.  Home Medications   Current Outpatient Rx  Name  Route  Sig  Dispense  Refill  . furosemide (LASIX) 40 MG tablet   Oral   Take 80 mg by mouth at bedtime.         . gabapentin (NEURONTIN) 300 MG capsule   Oral   Take 900 mg by mouth 2 (two) times daily.          . insulin lispro (HUMALOG) 100 UNIT/ML injection   Subcutaneous   Inject 100 Units into the skin 3 (three) times daily before meals.         [name] lisinopril-hydrochlorothiazide (PRINZIDE,ZESTORETIC) 20-12.5 MG per tablet   Oral   Take 1 tablet by mouth daily.          . metoprolol tartrate (LOPRESSOR) 25 MG tablet   Oral   Take 25 mg by mouth at bedtime.         . Multiple Vitamin (MULTIVITAMIN WITH MINERALS) TABS   Oral   Take 1 tablet by mouth daily.         . potassium chloride SA (K-DUR,KLOR-CON) 20 MEQ tablet   Oral   Take 20 mEq by mouth at bedtime.         . rosuvastatin (CRESTOR) 10 MG tablet   Oral   Take 20 mg by mouth every evening.          [name] spironolactone (ALDACTONE) 25 MG tablet   Oral   Take 1 tablet (25 mg total) by mouth daily.   30 tablet   12   . sulindac (CLINORIL) 200 MG tablet   Oral    Take 200 mg by mouth 2 (two) times daily.         [name] warfarin (COUMADIN) 5 MG tablet   Oral   Take 5-7.5 mg by mouth daily. Take 7.5mg  ONLY on Thursdays & Saturdays. All other days including Sundays take 5 mg.         [name]  warfarin (COUMADIN) 5 MG tablet   Oral   Take 1 tablet (5 mg total) by mouth as directed.   40 tablet   1    BP 131/64  Pulse 82  Temp(Src) 97.5 F (36.4 C) (Oral)  Resp 22  Wt 350 lb (158.759 kg)  SpO2 94% Physical Exam  Nursing note and vitals reviewed. Constitutional: He is oriented to person, place, and time. He appears well-developed and well-nourished. No distress.  Morbidly obese  HENT:  Head: Normocephalic.  Right Ear: External ear normal.  Left Ear: External ear normal.  Nose: Nose normal.  Mouth/Throat: Oropharynx is clear and moist. No oropharyngeal exudate.  Partial avulsion of left upper 3rd molar to the buccal side  Eyes: Conjunctivae are normal. Pupils are equal, round, and reactive to light. No scleral icterus.  Neck: Normal range of motion. Neck supple.  Cardiovascular: Normal rate, regular rhythm and normal heart sounds.  Exam reveals no gallop and no friction rub.   No murmur heard. Pulmonary/Chest: Effort normal and breath sounds normal. No respiratory distress. He has no wheezes. He has no rales. He exhibits tenderness.    Abdominal: Soft. Bowel sounds are normal. He exhibits no distension. There is no tenderness.  obese  Musculoskeletal: Normal range of motion. He exhibits no edema and no tenderness.  Lymphadenopathy:    He has no cervical adenopathy.  Neurological: He is alert and oriented to person, place, and time. He exhibits normal muscle tone. Coordination normal.  Skin: Skin is warm and dry. No rash noted. No erythema. No pallor.  Psychiatric: He has a normal mood and affect. His behavior is normal. Judgment and thought content normal.    ED Course  Procedures (including critical care time) Labs Review Labs Reviewed -  No data to display Imaging Review No results found.  EKG Interpretation   None      Medications  HYDROmorphone (DILAUDID) injection 1 mg (1 mg Intramuscular Given 07/22/13 1518)    MDM  Left rib contusion Dental fracture  Patient returns for pain management without any worsening of symptoms s/p jarring himself after slipping, no fracture, no alarming signs at this time.  Pain better under control, will rx with oxycodone and he will follow up with his PCP tomorrow.    Izola Price Marisue Humble, PA-C 07/22/13 1525

## 2013-07-23 NOTE — ED Provider Notes (Signed)
Medical screening examination/treatment/procedure(s) were performed by resident physician or non-physician practitioner and as supervising physician I was immediately available for consultation/collaboration.   Carmen Vallecillo DOUGLAS MD.   Shaily Librizzi D Braylin Xu, MD 07/23/13 1430 

## 2013-07-31 ENCOUNTER — Ambulatory Visit: Payer: PRIVATE HEALTH INSURANCE

## 2013-07-31 ENCOUNTER — Other Ambulatory Visit: Payer: PRIVATE HEALTH INSURANCE

## 2013-07-31 ENCOUNTER — Ambulatory Visit: Payer: PRIVATE HEALTH INSURANCE | Admitting: Internal Medicine

## 2013-08-03 ENCOUNTER — Ambulatory Visit (INDEPENDENT_AMBULATORY_CARE_PROVIDER_SITE_OTHER): Payer: PRIVATE HEALTH INSURANCE | Admitting: *Deleted

## 2013-08-03 DIAGNOSIS — Z7901 Long term (current) use of anticoagulants: Secondary | ICD-10-CM

## 2013-08-03 DIAGNOSIS — Z5181 Encounter for therapeutic drug level monitoring: Secondary | ICD-10-CM

## 2013-08-03 DIAGNOSIS — I4891 Unspecified atrial fibrillation: Secondary | ICD-10-CM

## 2013-08-03 LAB — POCT INR: INR: 3.5

## 2013-08-17 ENCOUNTER — Ambulatory Visit (INDEPENDENT_AMBULATORY_CARE_PROVIDER_SITE_OTHER): Payer: PRIVATE HEALTH INSURANCE

## 2013-08-17 DIAGNOSIS — Z7901 Long term (current) use of anticoagulants: Secondary | ICD-10-CM

## 2013-08-17 DIAGNOSIS — I4891 Unspecified atrial fibrillation: Secondary | ICD-10-CM

## 2013-08-17 DIAGNOSIS — Z5181 Encounter for therapeutic drug level monitoring: Secondary | ICD-10-CM

## 2013-08-17 LAB — POCT INR: INR: 3.4

## 2013-08-28 ENCOUNTER — Other Ambulatory Visit: Payer: Self-pay | Admitting: Cardiology

## 2013-08-29 ENCOUNTER — Telehealth: Payer: Self-pay | Admitting: Internal Medicine

## 2013-08-29 NOTE — Telephone Encounter (Signed)
[facility] CALLED TO R/S NEW PATIENT APPT TO 02/25 @ 1:30 W/DR. MOHAMED.  CALENDAR MAILED.

## 2013-08-31 ENCOUNTER — Telehealth: Payer: Self-pay | Admitting: Internal Medicine

## 2013-08-31 NOTE — Telephone Encounter (Signed)
pt lvm that he needed to r/s todays new pt appt...emailed Tiffany to r/s and call pt.

## 2013-09-05 ENCOUNTER — Ambulatory Visit: Payer: PRIVATE HEALTH INSURANCE | Admitting: Internal Medicine

## 2013-09-05 ENCOUNTER — Other Ambulatory Visit: Payer: PRIVATE HEALTH INSURANCE

## 2013-09-05 ENCOUNTER — Ambulatory Visit (INDEPENDENT_AMBULATORY_CARE_PROVIDER_SITE_OTHER): Payer: PRIVATE HEALTH INSURANCE | Admitting: *Deleted

## 2013-09-05 ENCOUNTER — Ambulatory Visit: Payer: PRIVATE HEALTH INSURANCE

## 2013-09-05 DIAGNOSIS — I4891 Unspecified atrial fibrillation: Secondary | ICD-10-CM

## 2013-09-05 DIAGNOSIS — Z7901 Long term (current) use of anticoagulants: Secondary | ICD-10-CM

## 2013-09-05 DIAGNOSIS — Z5181 Encounter for therapeutic drug level monitoring: Secondary | ICD-10-CM

## 2013-09-05 LAB — POCT INR: INR: 2.4

## 2013-09-16 ENCOUNTER — Other Ambulatory Visit: Payer: Self-pay | Admitting: Cardiology

## 2013-09-17 ENCOUNTER — Other Ambulatory Visit: Payer: Self-pay | Admitting: Cardiology

## 2013-09-26 ENCOUNTER — Ambulatory Visit (INDEPENDENT_AMBULATORY_CARE_PROVIDER_SITE_OTHER): Payer: PRIVATE HEALTH INSURANCE | Admitting: *Deleted

## 2013-09-26 DIAGNOSIS — Z7901 Long term (current) use of anticoagulants: Secondary | ICD-10-CM

## 2013-09-26 DIAGNOSIS — I4891 Unspecified atrial fibrillation: Secondary | ICD-10-CM

## 2013-09-26 DIAGNOSIS — Z5181 Encounter for therapeutic drug level monitoring: Secondary | ICD-10-CM

## 2013-09-26 LAB — POCT INR: INR: 1.7

## 2013-11-14 ENCOUNTER — Ambulatory Visit (INDEPENDENT_AMBULATORY_CARE_PROVIDER_SITE_OTHER): Payer: Medicare Other | Admitting: Podiatry

## 2013-11-14 ENCOUNTER — Encounter: Payer: Self-pay | Admitting: Podiatry

## 2013-11-14 VITALS — BP 117/59 | HR 89 | Resp 12

## 2013-11-14 DIAGNOSIS — B351 Tinea unguium: Secondary | ICD-10-CM

## 2013-11-14 DIAGNOSIS — M79609 Pain in unspecified limb: Secondary | ICD-10-CM

## 2013-11-14 DIAGNOSIS — Q828 Other specified congenital malformations of skin: Secondary | ICD-10-CM

## 2013-11-14 NOTE — Progress Notes (Signed)
   Subjective:    Patient ID: [name], male    DOB: 1951/07/09, 63 y.o.   MRN: [id number]  HPI PT STATED LT FOOT BOTTOM HEEL HAVE SPLINTER. THE HEEL IS MUCH BETTER BETTER. THE HEEL GET AGGRAVATED BY PRESSURE OR RUBBING THROUGH WITH THE HAND AND FEELING THE THICK SKIN. TRIED TO WIPE IT WITH ANTISEPTIC AND HELP SOME. TOENAILS TRIM IF POSSIBLE.   Patient admits that he walks without shoes and describes pulling a wood splinter from the plantar left foot. He did not notice any bleeding at the time. He says he areas feeling better, however, it would like to have evaluated.  He is also requesting debridement of painful mycotic toenails     Review of Systems  All other systems reviewed and are negative.      Objective:   Physical Exam Orientated x3 space obese male  Vascular: DP and PT pulses 1/4 bilaterally Please note that arterial Doppler dated 11/05/2011 demonstrated no obvious segmental arterial stenosis in the lower extremities.  Neurological: Sensation to 10 g monofilament wire intact one/5 left and 0/5 right Vibratory sensation nonreactive bilaterally Ankle reflexes reactive bilaterally  Dermatological: Nails x10 are elongated, incurvated, discolored, hypertrophic and tender to palpation  The plantar left heel has a small slightly raised keratotic lesion without any erythema, edema or drainage surrounding the lesion. After debridement this area appears to be a keratotic lesion without any obvious foreign body in the area.  The hygiene on the plantar aspect of the foot demonstrates a generalized black film consistent with poor hygiene and not wearing shoes.       Assessment & Plan:   Assessment: Satisfactory vascular status Diabetic peripheral neuropathy Symptomatic onychomycoses x10 Keratoses plantar left heel without obvious sign of retained foreign body Poor hygiene associated with lack of wearing shoes  Plan: Nails x10 are debrided The plantar left heel  was explored and keratoses debrided without any bleeding.  And patient advised that no obvious foreign body was noted in the left heel I advised patient to wear shoes and socks at all times.

## 2013-11-14 NOTE — Patient Instructions (Signed)
Wear shoes and socks at all times  Diabetes and Foot Care Diabetes may cause you to have problems because of poor blood supply (circulation) to your feet and legs. This may cause the skin on your feet to become thinner, break easier, and heal more slowly. Your skin may become dry, and the skin may peel and crack. You may also have nerve damage in your legs and feet causing decreased feeling in them. You may not notice minor injuries to your feet that could lead to infections or more serious problems. Taking care of your feet is one of the most important things you can do for yourself.  HOME CARE INSTRUCTIONS  Wear shoes at all times, even in the house. Do not go barefoot. Bare feet are easily injured.  Check your feet daily for blisters, cuts, and redness. If you cannot see the bottom of your feet, use a mirror or ask someone for help.  Wash your feet with warm water (do not use hot water) and mild soap. Then pat your feet and the areas between your toes until they are completely dry. Do not soak your feet as this can dry your skin.  Apply a moisturizing lotion or petroleum jelly (that does not contain alcohol and is unscented) to the skin on your feet and to dry, brittle toenails. Do not apply lotion between your toes.  Trim your toenails straight across. Do not dig under them or around the cuticle. File the edges of your nails with an emery board or nail file.  Do not cut corns or calluses or try to remove them with medicine.  Wear clean socks or stockings every day. Make sure they are not too tight. Do not wear knee-high stockings since they may decrease blood flow to your legs.  Wear shoes that fit properly and have enough cushioning. To break in new shoes, wear them for just a few hours a day. This prevents you from injuring your feet. Always look in your shoes before you put them on to be sure there are no objects inside.  Do not cross your legs. This may decrease the blood flow to your  feet.  If you find a minor scrape, cut, or break in the skin on your feet, keep it and the skin around it clean and dry. These areas may be cleansed with mild soap and water. Do not cleanse the area with peroxide, alcohol, or iodine.  When you remove an adhesive bandage, be sure not to damage the skin around it.  If you have a wound, look at it several times a day to make sure it is healing.  Do not use heating pads or hot water bottles. They may burn your skin. If you have lost feeling in your feet or legs, you may not know it is happening until it is too late.  Make sure your health care provider performs a complete foot exam at least annually or more often if you have foot problems. Report any cuts, sores, or bruises to your health care provider immediately. SEEK MEDICAL CARE IF:   You have an injury that is not healing.  You have cuts or breaks in the skin.  You have an ingrown nail.  You notice redness on your legs or feet.  You feel burning or tingling in your legs or feet.  You have pain or cramps in your legs and feet.  Your legs or feet are numb.  Your feet always feel cold. SEEK IMMEDIATE MEDICAL  CARE IF:   There is increasing redness, swelling, or pain in or around a wound.  There is a red line that goes up your leg.  Pus is coming from a wound.  You develop a fever or as directed by your health care provider.  You notice a bad smell coming from an ulcer or wound. Document Released: 06/25/2000 Document Revised: 02/28/2013 Document Reviewed: 12/05/2012 [facility] Patient Information 2014 [place], [place].

## 2013-11-15 ENCOUNTER — Encounter: Payer: Self-pay | Admitting: Cardiology

## 2013-11-15 ENCOUNTER — Encounter: Payer: Self-pay | Admitting: Podiatry

## 2013-11-16 NOTE — Telephone Encounter (Signed)
Spoke with pt, he reports for the last several months he has not felt well. He c/o legs being weak and SOB with exertion. He has swelling but no more than his usual. His current lasix dosage is 80 mg once daily.  He does not weigh daily and ios having no trouble sleeping The pt would like to be seen, will schedule with lori gerhardt np Monday He will take an extra dose of furosemide tomorrow to see if that will help his SOB Pt agreed with this plan.

## 2013-11-19 ENCOUNTER — Ambulatory Visit: Payer: PRIVATE HEALTH INSURANCE | Admitting: Nurse Practitioner

## 2013-11-19 ENCOUNTER — Telehealth: Payer: Self-pay | Admitting: Nurse Practitioner

## 2013-11-19 NOTE — Telephone Encounter (Signed)
T/W pt to r/s appointment pt aware

## 2013-11-19 NOTE — Telephone Encounter (Signed)
New message          Pt had to cancel appt today due to being sick. Can you work him in on another day? Next available is in June

## 2013-11-27 ENCOUNTER — Other Ambulatory Visit: Payer: Self-pay | Admitting: *Deleted

## 2013-11-27 ENCOUNTER — Ambulatory Visit (INDEPENDENT_AMBULATORY_CARE_PROVIDER_SITE_OTHER): Payer: PRIVATE HEALTH INSURANCE | Admitting: Nurse Practitioner

## 2013-11-27 ENCOUNTER — Encounter: Payer: Self-pay | Admitting: Nurse Practitioner

## 2013-11-27 ENCOUNTER — Ambulatory Visit (INDEPENDENT_AMBULATORY_CARE_PROVIDER_SITE_OTHER): Payer: PRIVATE HEALTH INSURANCE

## 2013-11-27 VITALS — BP 160/60 | HR 94 | Ht 72.0 in | Wt 398.8 lb

## 2013-11-27 DIAGNOSIS — I4891 Unspecified atrial fibrillation: Secondary | ICD-10-CM

## 2013-11-27 DIAGNOSIS — E785 Hyperlipidemia, unspecified: Secondary | ICD-10-CM

## 2013-11-27 DIAGNOSIS — R0609 Other forms of dyspnea: Secondary | ICD-10-CM

## 2013-11-27 DIAGNOSIS — R0989 Other specified symptoms and signs involving the circulatory and respiratory systems: Secondary | ICD-10-CM

## 2013-11-27 DIAGNOSIS — Z7901 Long term (current) use of anticoagulants: Secondary | ICD-10-CM

## 2013-11-27 DIAGNOSIS — R0600 Dyspnea, unspecified: Secondary | ICD-10-CM

## 2013-11-27 DIAGNOSIS — Z5181 Encounter for therapeutic drug level monitoring: Secondary | ICD-10-CM

## 2013-11-27 DIAGNOSIS — I2510 Atherosclerotic heart disease of native coronary artery without angina pectoris: Secondary | ICD-10-CM

## 2013-11-27 LAB — BASIC METABOLIC PANEL
BUN: 25 mg/dL — ABNORMAL HIGH (ref 6–23)
CO2: 28 mEq/L (ref 19–32)
Calcium: 9 mg/dL (ref 8.4–10.5)
Chloride: 98 mEq/L (ref 96–112)
Creatinine, Ser: 1.9 mg/dL — ABNORMAL HIGH (ref 0.4–1.5)
GFR: 37.44 mL/min — ABNORMAL LOW (ref >60.00)
Glucose, Bld: 105 mg/dL — ABNORMAL HIGH (ref 70–99)
Potassium: 4 mEq/L (ref 3.5–5.1)
Sodium: 137 mEq/L (ref 135–145)

## 2013-11-27 LAB — HEPATIC FUNCTION PANEL
ALT: 61 U/L — ABNORMAL HIGH (ref 0–53)
AST: 61 U/L — ABNORMAL HIGH (ref 0–37)
Albumin: 3.6 g/dL (ref 3.5–5.2)
Alkaline Phosphatase: 45 U/L (ref 39–117)
Bilirubin, Direct: 0.1 mg/dL (ref 0.0–0.3)
Total Bilirubin: 0.5 mg/dL (ref 0.2–1.2)
Total Protein: 7.2 g/dL (ref 6.0–8.3)

## 2013-11-27 LAB — CBC
HCT: 36.1 % — ABNORMAL LOW (ref 39.0–52.0)
Hemoglobin: 11.8 g/dL — ABNORMAL LOW (ref 13.0–17.0)
MCHC: 32.6 g/dL (ref 30.0–36.0)
MCV: 81.2 fl (ref 78.0–100.0)
Platelets: 254 10*3/uL (ref 150.0–400.0)
RBC: 4.45 Mil/uL (ref 4.22–5.81)
RDW: 16.3 % — ABNORMAL HIGH (ref 11.5–15.5)
WBC: 13.1 10*3/uL — ABNORMAL HIGH (ref 4.0–10.5)

## 2013-11-27 LAB — MAGNESIUM: Magnesium: 1.7 mg/dL (ref 1.5–2.5)

## 2013-11-27 LAB — BRAIN NATRIURETIC PEPTIDE: Pro B Natriuretic peptide (BNP): 54 pg/mL (ref 0.0–100.0)

## 2013-11-27 LAB — TSH: TSH: 0.49 u[IU]/mL (ref 0.35–4.50)

## 2013-11-27 LAB — POCT INR: INR: 2

## 2013-11-27 MED ORDER — FUROSEMIDE 40 MG PO TABS: 80.0000 mg | ORAL_TABLET | Freq: Every day | ORAL | Status: DC

## 2013-11-27 NOTE — Patient Instructions (Addendum)
Stay on your current medicines but I am increasing your Lasix to 80 mg a day  We will check labs today  We will check an EKG today  We will arrange for an echocardiogram and a stress test  We will check your coumadin today  I will see you back in follow up  Think about what we talked about today - no more soda please  Call the [facility] office at [phone] if you have any questions, problems or concerns.

## 2013-11-27 NOTE — Progress Notes (Addendum)
[name] Date of Birth: [birth year] Medical Record #[id number]  History of Present Illness: [name] is seen back today for a follow up visit. Seen for Dr. [name]. He has atrial fib with past cardioversion back in 2010 and CAD with PCI/DES of the RCA in 2012 at [facility]. Other issues include IDDM, HTN, HLD, neuropathy, RA, anemia, OSA and obesity. Has normal EF on past echo.    Last seen here back in November of 2014. - Was doing ok. Had some stable DOE.  Comes back today. Here alone. He remains short of breath. Remains morbidly obese. He is here to "make sure his heart is ok". He is more short of breath. He is back gaining weight. Almost up 50 pounds since January. Some swelling. Not bloated. No cough. Short of breath with minimal activity - not at rest. No PND or orthopnea. Not using his CPAP. Drinking 2 liter Pepsi per day. Very sedentary. No chest pain. Takes all of his medicines at night - even his lasix. Sugars are sky high.   Current Outpatient Prescriptions  Medication Sig Dispense Refill  . ACCU-CHEK FASTCLIX LANCETS MISC       . furosemide (LASIX) 40 MG tablet Take 80 mg by mouth at bedtime.      . gabapentin (NEURONTIN) 300 MG capsule Take 900 mg by mouth 2 (two) times daily.       [name] HUMULIN R 500 UNIT/ML SOLN injection Inject 16 Units into the skin 3 (three) times daily with meals.       [name] lisinopril-hydrochlorothiazide (PRINZIDE,ZESTORETIC) 20-12.5 MG per tablet Take 1 tablet by mouth daily.       . metoprolol tartrate (LOPRESSOR) 25 MG tablet Take 25 mg by mouth at bedtime.      . Multiple Vitamin (MULTIVITAMIN WITH MINERALS) TABS Take 1 tablet by mouth daily.      [name] oxyCODONE (ROXICODONE) 5 MG immediate release tablet Take 1 tablet (5 mg total) by mouth every 4 (four) hours as needed for severe pain.  30 tablet  0  . potassium chloride SA (KLOR-CON M20) 20 MEQ tablet Take 1 tablet (20 mEq total) by mouth daily.  30 tablet  4  . rosuvastatin (CRESTOR) 10 MG  tablet Take 20 mg by mouth every evening.       [name] spironolactone (ALDACTONE) 25 MG tablet Take 1 tablet (25 mg total) by mouth daily.  30 tablet  12  . warfarin (COUMADIN) 5 MG tablet TAKE AS DIRECTED BY ANTICOAGULATION CLINIC  40 tablet  1   No current facility-administered medications for this visit.    No Known Allergies  Past Medical History  Diagnosis Date  . Diabetes mellitus   . Hypertension   . Rheumatoid arthritis(714.0)   . Neuropathy   . CHF (congestive heart failure)   . History of MI (myocardial infarction)     SILENT MI IN PAST   . CAD (coronary artery disease)   . Atrial fibrillation   . Anemia   . Myocardial infarction     pt stated he had a slient heart attack that showed up on nuclear stress.  . Sleep apnea      SEVERE, NO CPAP USED, DOES NOT KNOW  WHERE SLEEP STUDY DONE  . Sleep apnea     sleep study done last week   dr Tilman Neat  . Pilonidal cyst 01/25/2013  . Shortness of breath     with ambulation, with exertion  . Chronic kidney disease  CKD stage 3  . Neuropathy   . History of blood transfusion   . Carpal tunnel syndrome, bilateral     Past Surgical History  Procedure Laterality Date  . Abdominal surgery  AGE 16 MONTHS    BENIGN TUMOR REMOVED  . Debridement  foot  FEW YRS AGO    LEFT FOOT, WOUNDS ALL HEALED NOW  . Coronary angioplasty with stent placement  August 2012    [facility]  . Cataract extraction w/phaco Right 11/15/2012    Procedure: CATARACT EXTRACTION PHACO AND INTRAOCULAR LENS PLACEMENT (IOC);  Surgeon: Shade FloodGreer Geiger, MD;  [facility];  Service: Ophthalmology;  Laterality: Right;  . Cataract extraction w/phaco Left 11/29/2012    Procedure: CATARACT EXTRACTION PHACO AND INTRAOCULAR LENS PLACEMENT (IOC);  Surgeon: Shade FloodGreer Geiger, MD;  [facility];  Service: Ophthalmology;  Laterality: Left;  . Foot surgery    . Pilonidal cyst excision N/A 01/08/2013    Procedure: CYST EXCISION PILONIDAL  EXTENSIVE;  Surgeon: Axel FillerArmando Ramirez, MD;  Location: [facility] OR;  Service: General;  Laterality: N/A;  . Foreign body removal Right 2014    Right heel splinter removal   . Eye surgery Bilateral     cataract  . Pars plana vitrectomy Left 06/05/2013    Procedure: PARS PLANA VITRECTOMY WITH 23 GAUGE with Endolaser(constellation);  Surgeon: Shade FloodGreer Geiger, MD;  [facility];  Service: Ophthalmology;  Laterality: Left;    History  Smoking status  . Former Smoker -- 1.00 packs/day for 20 years  . Types: Cigarettes  . Quit date: 07/13/1983  Smokeless tobacco  . Never Used    History  Alcohol Use No    Family History  Problem Relation Age of Onset  . Other Other     PT ADOPTED    Review of Systems: The review of systems is per the HPI.  All other systems were reviewed and are negative.  Physical Exam: BP 160/60  Pulse 94  Ht 6' (1.829 m)  Wt 398 lb 12.8 oz (180.894 kg)  BMI 54.08 kg/m2  SpO2 96% Patient is very pleasant and in no acute distress. He is morbidly obese. His weight is up tremendously. Skin is warm and dry. Color is normal.  HEENT is unremarkable. Normocephalic/atraumatic. PERRL. Sclera are nonicteric. Neck is supple. No masses. No JVD. Lungs are fairly clear. Cardiac exam shows a regular rate and rhythm. Heart tones distant. Abdomen is obese but soft. Extremities are with trace edema. Gait and ROM are intact. No gross neurologic deficits noted.  Wt Readings from Last 3 Encounters:  11/27/13 398 lb 12.8 oz (180.894 kg)  07/22/13 350 lb (158.759 kg)  06/14/13 369 lb (167.377 kg)     LABORATORY DATA: PENDING   Lab Results  Component Value Date   WBC 13.3* 06/05/2013   HGB 11.2* 06/05/2013   HCT 34.0* 06/05/2013   PLT 322 06/05/2013   GLUCOSE 145* 06/05/2013   CHOL 143 08/31/2011   TRIG 212.0* 08/31/2011   HDL 37.30* 08/31/2011   LDLDIRECT 82.1 08/31/2011   LDLCALC 101* 11/06/2009   ALT 29 08/31/2011   AST 26 08/31/2011   NA 135 06/05/2013   K 3.6 06/05/2013    CL 99 06/05/2013   CREATININE 1.05 06/05/2013   BUN 13 06/05/2013   CO2 23 06/05/2013   TSH 0.51 08/31/2011   PSA 0.68 10/01/2008   INR 1.7 09/26/2013   HGBA1C 10.1* 09/08/2012   MICROALBUR 6.24* 04/07/2009    Lab  Results  Component Value Date   INR 1.7 09/26/2013   INR 2.4 09/05/2013   INR 3.4 08/17/2013   PROTIME 15.8 12/25/2008    Echo Study Conclusions February 2013  - Left ventricle: The cavity size was normal. Systolic function was normal. The estimated ejection fraction was in the range of 55% to 60%. Wall motion was normal; there were no regional wall motion abnormalities. Doppler parameters are consistent with elevated mean left atrial filling pressure. - Left atrium: The atrium was mildly to moderately dilated.    Assessment / Plan: 1. CAD - with prior PCI - will update his Myoview.   2. Atrial fib - seems to be in a regular rhythm by exam. Checking EKG today. Remains on his coumadin but has not been compliant with INR checks. Will check today.   3. HTN - recheck of his BP by me is 140/60.   4. HLD  5. DOE - felt to be multifactorial - we will check labs today, update his echo, update his Myoview. He has to help by doing his part with the soda - will increase the Lasix to 80 mg a day. Check labs today. I will see back for discussion.   6. Morbid obesity - this is felt to be the crux of his issues - his longterm prognosis is quite guarded if he does not start making changes - this was discussed at length.   7. OSA - not wearing CPAP - he is encouraged to get back to using.   Patient is agreeable to this plan and will call if any problems develop in the interim.   Rosalio Macadamia, RN, ANP-C [facility] [address] [place], [place]  [address] [phone]   Addendum:  EKG today shows sinus rhythm.

## 2013-11-29 ENCOUNTER — Other Ambulatory Visit (HOSPITAL_COMMUNITY): Payer: PRIVATE HEALTH INSURANCE

## 2013-12-04 MED ORDER — DIPHENHYDRAMINE HCL 50 MG/ML IJ SOLN
INTRAMUSCULAR | Status: AC
  Filled 2013-12-04: qty 1

## 2013-12-04 MED ORDER — ONDANSETRON 16 MG/50ML IVPB (CHCC)
INTRAVENOUS | Status: AC
  Filled 2013-12-04: qty 16

## 2013-12-04 MED ORDER — DEXAMETHASONE SODIUM PHOSPHATE 20 MG/5ML IJ SOLN
INTRAMUSCULAR | Status: AC
  Filled 2013-12-04: qty 5

## 2013-12-04 MED ORDER — FAMOTIDINE IN NACL 20-0.9 MG/50ML-% IV SOLN
INTRAVENOUS | Status: AC
  Filled 2013-12-04: qty 50

## 2013-12-11 ENCOUNTER — Encounter: Payer: Self-pay | Admitting: Cardiology

## 2013-12-13 ENCOUNTER — Other Ambulatory Visit: Payer: Self-pay | Admitting: *Deleted

## 2013-12-13 ENCOUNTER — Ambulatory Visit (HOSPITAL_COMMUNITY): Payer: PRIVATE HEALTH INSURANCE | Attending: Cardiovascular Disease | Admitting: Radiology

## 2013-12-13 VITALS — BP 122/39 | HR 85 | Ht 72.0 in | Wt 397.0 lb

## 2013-12-13 DIAGNOSIS — Z794 Long term (current) use of insulin: Secondary | ICD-10-CM | POA: Insufficient documentation

## 2013-12-13 DIAGNOSIS — Z87891 Personal history of nicotine dependence: Secondary | ICD-10-CM | POA: Insufficient documentation

## 2013-12-13 DIAGNOSIS — I10 Essential (primary) hypertension: Secondary | ICD-10-CM | POA: Insufficient documentation

## 2013-12-13 DIAGNOSIS — R0600 Dyspnea, unspecified: Secondary | ICD-10-CM

## 2013-12-13 DIAGNOSIS — Z7901 Long term (current) use of anticoagulants: Secondary | ICD-10-CM

## 2013-12-13 DIAGNOSIS — I252 Old myocardial infarction: Secondary | ICD-10-CM | POA: Insufficient documentation

## 2013-12-13 DIAGNOSIS — R0602 Shortness of breath: Secondary | ICD-10-CM

## 2013-12-13 DIAGNOSIS — R0609 Other forms of dyspnea: Secondary | ICD-10-CM | POA: Diagnosis not present

## 2013-12-13 DIAGNOSIS — E119 Type 2 diabetes mellitus without complications: Secondary | ICD-10-CM | POA: Diagnosis not present

## 2013-12-13 DIAGNOSIS — E663 Overweight: Secondary | ICD-10-CM | POA: Insufficient documentation

## 2013-12-13 DIAGNOSIS — I2510 Atherosclerotic heart disease of native coronary artery without angina pectoris: Secondary | ICD-10-CM | POA: Diagnosis present

## 2013-12-13 DIAGNOSIS — Z9861 Coronary angioplasty status: Secondary | ICD-10-CM | POA: Diagnosis not present

## 2013-12-13 DIAGNOSIS — R0989 Other specified symptoms and signs involving the circulatory and respiratory systems: Secondary | ICD-10-CM | POA: Insufficient documentation

## 2013-12-13 DIAGNOSIS — I4891 Unspecified atrial fibrillation: Secondary | ICD-10-CM | POA: Insufficient documentation

## 2013-12-13 MED ORDER — REGADENOSON 0.4 MG/5ML IV SOLN
0.4000 mg | Freq: Once | INTRAVENOUS | Status: AC
  Administered 2013-12-13: 0.4 mg via INTRAVENOUS

## 2013-12-13 MED ORDER — WARFARIN SODIUM 5 MG PO TABS: ORAL_TABLET | ORAL | Status: DC

## 2013-12-13 MED ORDER — TECHNETIUM TC 99M SESTAMIBI GENERIC - CARDIOLITE
33.0000 | Freq: Once | INTRAVENOUS | Status: AC | PRN
  Administered 2013-12-13: 33 via INTRAVENOUS

## 2013-12-13 NOTE — Progress Notes (Signed)
[facility] SITE 3 NUCLEAR MED [address] [place], [place] [address] [phone]    Cardiology Nuclear Med Study  [name] is a 63 y.o. male     MRN : [id number]     DOB: 1951/04/05  Procedure Date: 12/13/2013  Nuclear Med Background Indication for Stress Test:  Evaluation for Ischemia History:  CAD, MI, Stent (RCA), Afib (hx. cardioversion), Echo 2013 EF 55-60%, MPI 2010 EF 51% (fixed defect inf.) Cardiac Risk Factors: History of Smoking, Hypertension, IDDM Type 2, Lipids and Overweight  Symptoms:  DOE   Nuclear Pre-Procedure Caffeine/Decaff Intake:  None NPO After: 8:00pm   Lungs:  clear O2 Sat: 96% on room air. IV 0.9% NS with Angio Cath:  22g  IV Site: R Hand  IV Started by:  Bonnita Levan, RN  Chest Size (in):  60 Cup Size: n/a  Height: 6' (1.829 m)  Weight:  397 lb (180.078 kg)  BMI:  Body mass index is 53.83 kg/(m^2). Tech Comments:  n/a    Nuclear Med Study 1 or 2 day study: 2 day  Stress Test Type:  Lexiscan  Reading MD: N/A  Order Authorizing Provider: Rollene Rotunda, MD  Resting Radionuclide: Technetium 68m Sestamibi  Resting Radionuclide Dose: 33.0 mCi  On       01-16-14  Stress Radionuclide:  Technetium 30m Sestamibi  Stress Radionuclide Dose: 33.0 mCi  On          12-13-13          Stress Protocol Rest HR: 85 Stress HR: 94  Rest BP: 122/39 Stress BP: 149/24  Exercise Time (min): n/a METS: n/a   Predicted Max HR: 158 bpm % Max HR: 59.49 bpm Rate Pressure Product: 32355   Dose of Adenosine (mg):  n/a Dose of Lexiscan: 0.4 mg  Dose of Atropine (mg): n/a Dose of Dobutamine: n/a mcg/kg/min (at max HR)  Stress Test Technologist: Nelson Chimes, BS-ES  Nuclear Technologist:  Doyne Keel, CNMT     Rest Procedure:  Myocardial perfusion imaging was performed at rest 45 minutes following the intravenous administration of Technetium 7m Sestamibi. Rest ECG: NSR - Normal EKG  Stress Procedure:  The patient received IV Lexiscan 0.4 mg over  15-seconds.  Technetium 48m Sestamibi injected at 30-seconds.  Quantitative spect images were obtained after a 45 minute delay.  During the infusion of Lexiscan the patient complained of SOB and lightheadedness.  These symptoms began to resolve in recovery.  Stress ECG: There are scattered PVCs.  QPS Raw Data Images:  Normal; no motion artifact; normal heart/lung ratio. Stress Images:  Decreased uptake at the apex and lateral wall Rest Images:  decreased uptake on the lateral wall Subtraction (SDS):  Partially reversible apical lateral defect Transient Ischemic Dilatation (Normal <1.22):  0.90 Lung/Heart Ratio (Normal <0.45):  0.40  Quantitative Gated Spect Images QGS EDV:  155 ml QGS ESV:  60 ml  Impression Exercise Capacity:  Lexiscan with no exercise. BP Response:  Normal blood pressure response. Clinical Symptoms:  No significant symptoms noted. ECG Impression:  There are scattered PVCs. Comparison with Prior Nuclear Study: Prior study in 2013 showed fixed inferior wall defect  Overall Impression:  Intermediate risk stress nuclear study with partially reversible apical lateral defect suggesting ischemia.  LV Ejection Fraction: 61%.  LV Wall Motion:  Normal Wall Motion  Chrystie Nose, MD, [facility] Board Certified in Nuclear Cardiology Attending Cardiologist [facility]

## 2013-12-19 ENCOUNTER — Encounter (HOSPITAL_COMMUNITY): Payer: PRIVATE HEALTH INSURANCE

## 2013-12-19 ENCOUNTER — Other Ambulatory Visit (HOSPITAL_COMMUNITY): Payer: PRIVATE HEALTH INSURANCE

## 2013-12-25 ENCOUNTER — Ambulatory Visit (INDEPENDENT_AMBULATORY_CARE_PROVIDER_SITE_OTHER): Payer: PRIVATE HEALTH INSURANCE

## 2013-12-25 ENCOUNTER — Ambulatory Visit (HOSPITAL_COMMUNITY): Payer: PRIVATE HEALTH INSURANCE | Attending: Nurse Practitioner | Admitting: Radiology

## 2013-12-25 ENCOUNTER — Other Ambulatory Visit (HOSPITAL_COMMUNITY): Payer: PRIVATE HEALTH INSURANCE | Admitting: Cardiology

## 2013-12-25 DIAGNOSIS — I509 Heart failure, unspecified: Secondary | ICD-10-CM | POA: Diagnosis not present

## 2013-12-25 DIAGNOSIS — R0989 Other specified symptoms and signs involving the circulatory and respiratory systems: Secondary | ICD-10-CM | POA: Diagnosis present

## 2013-12-25 DIAGNOSIS — R0609 Other forms of dyspnea: Secondary | ICD-10-CM | POA: Insufficient documentation

## 2013-12-25 DIAGNOSIS — Z7901 Long term (current) use of anticoagulants: Secondary | ICD-10-CM

## 2013-12-25 DIAGNOSIS — Z5181 Encounter for therapeutic drug level monitoring: Secondary | ICD-10-CM

## 2013-12-25 DIAGNOSIS — I5032 Chronic diastolic (congestive) heart failure: Secondary | ICD-10-CM | POA: Insufficient documentation

## 2013-12-25 DIAGNOSIS — R0602 Shortness of breath: Secondary | ICD-10-CM

## 2013-12-25 DIAGNOSIS — I4891 Unspecified atrial fibrillation: Secondary | ICD-10-CM

## 2013-12-25 DIAGNOSIS — R0600 Dyspnea, unspecified: Secondary | ICD-10-CM

## 2013-12-25 DIAGNOSIS — I2510 Atherosclerotic heart disease of native coronary artery without angina pectoris: Secondary | ICD-10-CM

## 2013-12-25 LAB — POCT INR: INR: 1.6

## 2013-12-25 MED ORDER — PERFLUTREN PROTEIN A MICROSPH IV SUSP
1.5000 mL | Freq: Once | INTRAVENOUS | Status: AC
  Administered 2013-12-25: 1.5 mL via INTRAVENOUS

## 2013-12-25 NOTE — Progress Notes (Signed)
Echocardiogram performed with [employer].  

## 2014-01-01 ENCOUNTER — Encounter (HOSPITAL_COMMUNITY): Payer: PRIVATE HEALTH INSURANCE

## 2014-01-02 ENCOUNTER — Ambulatory Visit: Payer: PRIVATE HEALTH INSURANCE | Admitting: Nurse Practitioner

## 2014-01-04 ENCOUNTER — Other Ambulatory Visit: Payer: Self-pay

## 2014-01-04 MED ORDER — SPIRONOLACTONE 25 MG PO TABS: 25.0000 mg | ORAL_TABLET | Freq: Every day | ORAL | Status: DC

## 2014-01-04 MED ORDER — FUROSEMIDE 40 MG PO TABS: 80.0000 mg | ORAL_TABLET | Freq: Every day | ORAL | Status: DC

## 2014-01-08 ENCOUNTER — Encounter (HOSPITAL_COMMUNITY): Payer: PRIVATE HEALTH INSURANCE

## 2014-01-16 ENCOUNTER — Ambulatory Visit (INDEPENDENT_AMBULATORY_CARE_PROVIDER_SITE_OTHER): Payer: PRIVATE HEALTH INSURANCE | Admitting: *Deleted

## 2014-01-16 ENCOUNTER — Other Ambulatory Visit: Payer: Self-pay | Admitting: *Deleted

## 2014-01-16 ENCOUNTER — Ambulatory Visit (HOSPITAL_COMMUNITY): Payer: PRIVATE HEALTH INSURANCE | Attending: Cardiovascular Disease | Admitting: Radiology

## 2014-01-16 DIAGNOSIS — I4891 Unspecified atrial fibrillation: Secondary | ICD-10-CM

## 2014-01-16 DIAGNOSIS — R0989 Other specified symptoms and signs involving the circulatory and respiratory systems: Secondary | ICD-10-CM

## 2014-01-16 DIAGNOSIS — Z5181 Encounter for therapeutic drug level monitoring: Secondary | ICD-10-CM

## 2014-01-16 LAB — POCT INR: INR: 1.6

## 2014-01-16 MED ORDER — TECHNETIUM TC 99M SESTAMIBI GENERIC - CARDIOLITE
30.0000 | Freq: Once | INTRAVENOUS | Status: AC | PRN
  Administered 2014-01-16: 30 via INTRAVENOUS

## 2014-01-16 MED ORDER — FUROSEMIDE 40 MG PO TABS: 80.0000 mg | ORAL_TABLET | Freq: Every day | ORAL | Status: DC

## 2014-01-17 ENCOUNTER — Telehealth: Payer: Self-pay | Admitting: Nurse Practitioner

## 2014-01-17 NOTE — Telephone Encounter (Signed)
New problem ° ° °Pt returning your call. °

## 2014-01-22 ENCOUNTER — Ambulatory Visit (INDEPENDENT_AMBULATORY_CARE_PROVIDER_SITE_OTHER): Payer: PRIVATE HEALTH INSURANCE | Admitting: Cardiology

## 2014-01-22 ENCOUNTER — Encounter: Payer: Self-pay | Admitting: Cardiology

## 2014-01-22 VITALS — BP 118/70 | HR 94 | Ht 72.0 in | Wt 393.9 lb

## 2014-01-22 DIAGNOSIS — I10 Essential (primary) hypertension: Secondary | ICD-10-CM

## 2014-01-22 DIAGNOSIS — I2584 Coronary atherosclerosis due to calcified coronary lesion: Secondary | ICD-10-CM

## 2014-01-22 DIAGNOSIS — I480 Paroxysmal atrial fibrillation: Secondary | ICD-10-CM

## 2014-01-22 DIAGNOSIS — I4891 Unspecified atrial fibrillation: Secondary | ICD-10-CM

## 2014-01-22 DIAGNOSIS — E785 Hyperlipidemia, unspecified: Secondary | ICD-10-CM

## 2014-01-22 DIAGNOSIS — I5040 Unspecified combined systolic (congestive) and diastolic (congestive) heart failure: Secondary | ICD-10-CM

## 2014-01-22 DIAGNOSIS — I2510 Atherosclerotic heart disease of native coronary artery without angina pectoris: Secondary | ICD-10-CM

## 2014-01-22 NOTE — Patient Instructions (Signed)
Your physician recommends that you schedule a follow-up appointment in: 3 MONTHS WITH DR CRENSHAW  Your physician recommends that you HAVE LAB WORK TODAY  

## 2014-01-22 NOTE — Assessment & Plan Note (Signed)
Patient remains in sinus rhythm for most recent electrocardiogramAnd on exam. Continue beta blocker and Coumadin.

## 2014-01-22 NOTE — Assessment & Plan Note (Signed)
Patient counseled on weight loss. 

## 2014-01-22 NOTE — Assessment & Plan Note (Signed)
Blood pressure controlled. Continue present medications. 

## 2014-01-22 NOTE — Assessment & Plan Note (Signed)
Euvolemic on examination. Continue present dose of Lasix. Check potassium and renal function. 

## 2014-01-22 NOTE — Assessment & Plan Note (Signed)
Continue statin. 

## 2014-01-22 NOTE — Assessment & Plan Note (Signed)
Continue Statin. Not on aspirin given need for Coumadin. I have reviewed his nuclear study. He does have ischemia and is complaining of increased dyspnea. There are multiple possible reasons for his dyspnea including deconditioning, obesity hypoventilation syndrome and obstructive sleep apnea. However he is diabetic and also has coronary disease previously. He could be having anginal equivalent. He would be at increased risk of contrast nephropathy. Check potassium and renal function today. If renal function reasonable we will most likely proceed with cardiac catheterization (right and left). The risks and benefits were discussed and he agrees to proceed. He would most likely need to be admitted the night before for hydration. We would need to hold diuretics prior to his procedure. Further decision once followup renal function known.

## 2014-01-22 NOTE — Progress Notes (Signed)
HPI:  FU atrial fibrillation and CAD. Transesophageal echocardiogram in November of 2010 revealed normal LV function, moderate left atrial enlargement, mild right atrial enlargement, possible left atrial appendage thrombus and mild mitral regurgitation. Note a TSH was low but free T3 and free T4 were normal. He has been followed by endocrinology for this. We scheduled a cardioversion which was performed on June 18, 2009. The procedure was successful. Patient had PCI of his right coronary artery in August of 2012 in [place]. He had a drug-eluting stent. ABIs in April of 2013 were unremarkable. Seen 5/15 for dyspnea; weight gain noted; lasix increased. Echo 6/15 showed normal LV function, mild LAE and grade 1 diastolic dysfunction; cannot R/O small oscillating density on aortic valve. Nuclear study 6/15 showed EF 61 with partially reversible apical lateral defect. Since last seen, He does note dyspnea on exertion. No orthopnea, PND or chest pain. Minimal pedal edema has improved with higher dose diuretics.   Current Outpatient Prescriptions  Medication Sig Dispense Refill  . ACCU-CHEK FASTCLIX LANCETS MISC       . ferrous fumarate (HEMOCYTE - 106 MG FE) 325 (106 FE) MG TABS tablet Take 1 tablet by mouth daily.      . furosemide (LASIX) 40 MG tablet Take 2 tablets (80 mg total) by mouth at bedtime.  30 tablet  6  . gabapentin (NEURONTIN) 300 MG capsule Take 1,200 mg by mouth daily.       [name] HUMULIN R 500 UNIT/ML SOLN injection Inject 16 Units into the skin 3 (three) times daily with meals.       [name] lisinopril-hydrochlorothiazide (PRINZIDE,ZESTORETIC) 20-12.5 MG per tablet Take 1 tablet by mouth daily.       . metoprolol tartrate (LOPRESSOR) 25 MG tablet Take 25 mg by mouth at bedtime.      . Multiple Vitamin (MULTIVITAMIN WITH MINERALS) TABS Take 1 tablet by mouth daily.      [name] oxycodone (ROXICODONE) 30 MG immediate release tablet Take 30 mg by mouth every 4 (four) hours as needed for  pain.      . potassium chloride SA (KLOR-CON M20) 20 MEQ tablet Take 1 tablet (20 mEq total) by mouth daily.  30 tablet  4  . rosuvastatin (CRESTOR) 10 MG tablet Take 20 mg by mouth every evening.       [name] spironolactone (ALDACTONE) 25 MG tablet Take 1 tablet (25 mg total) by mouth daily.  30 tablet  6  . warfarin (COUMADIN) 5 MG tablet 1 tablet daily except 1.5 tablets on Thursdays or as directed by coumadin clinic  40 tablet  3   No current facility-administered medications for this visit.     Past Medical History  Diagnosis Date  . Diabetes mellitus   . Hypertension   . Rheumatoid arthritis(714.0)   . Neuropathy   . CHF (congestive heart failure)   . History of MI (myocardial infarction)     SILENT MI IN PAST   . CAD (coronary artery disease)   . Atrial fibrillation   . Anemia   . Myocardial infarction     pt stated he had a slient heart attack that showed up on nuclear stress.  . Sleep apnea      SEVERE, NO CPAP USED, DOES NOT KNOW  WHERE SLEEP STUDY DONE  . Sleep apnea     sleep study done last week   dr Tilman Neat  . Pilonidal cyst 01/25/2013  . Shortness of breath  with ambulation, with exertion  . Chronic kidney disease     CKD stage 3  . Neuropathy   . History of blood transfusion   . Carpal tunnel syndrome, bilateral     Past Surgical History  Procedure Laterality Date  . Abdominal surgery  AGE 27 MONTHS    BENIGN TUMOR REMOVED  . Debridement  foot  FEW YRS AGO    LEFT FOOT, WOUNDS ALL HEALED NOW  . Coronary angioplasty with stent placement  August 2012    RCA DES - [facility]  . Cataract extraction w/phaco Right 11/15/2012    Procedure: CATARACT EXTRACTION PHACO AND INTRAOCULAR LENS PLACEMENT (IOC);  Surgeon: Shade Flood, MD;  Location: [facility] OR;  Service: Ophthalmology;  Laterality: Right;  . Cataract extraction w/phaco Left 11/29/2012    Procedure: CATARACT EXTRACTION PHACO AND INTRAOCULAR LENS PLACEMENT (IOC);  Surgeon: Shade Flood, MD;  Location: [facility] OR;  Service: Ophthalmology;  Laterality: Left;  . Foot surgery    . Pilonidal cyst excision N/A 01/08/2013    Procedure: CYST EXCISION PILONIDAL EXTENSIVE;  Surgeon: Axel Filler, MD;  Location: [facility] OR;  Service: General;  Laterality: N/A;  . Foreign body removal Right 2014    Right heel splinter removal   . Eye surgery Bilateral     cataract  . Pars plana vitrectomy Left 06/05/2013    Procedure: PARS PLANA VITRECTOMY WITH 23 GAUGE with Endolaser(constellation);  Surgeon: Shade Flood, MD;  Location: [facility] OR;  Service: Ophthalmology;  Laterality: Left;    History   Social History  . Marital Status: Single    Spouse Name: N/A    Number of Children: Y  . Years of Education: N/A   Occupational History  . disabled truck driver    Social History Main Topics  . Smoking status: Former Smoker -- 1.00 packs/day for 20 years    Types: Cigarettes    Quit date: 07/13/1983  . Smokeless tobacco: Never Used  . Alcohol Use: No  . Drug Use: No  . Sexual Activity: Not on file   Other Topics Concern  . Not on file   Social History Narrative   Pt is single.   Lives with sister.   Has children.   Was adopted.   Daily cafffiene-2 cups of coffee and 2 sodas per day          ROS: no fevers or chills, productive cough, hemoptysis, dysphasia, odynophagia, melena, hematochezia, dysuria, hematuria, rash, seizure activity, orthopnea, PND, pedal edema, claudication. Remaining systems are negative.  Physical Exam: Well-developed morbidly obese in no acute distress.  Skin is warm and dry.  HEENT is normal.  Neck is supple.  Chest is clear to auscultation with normal expansion.  Cardiovascular exam is regular rate and rhythm.  Abdominal exam nontender or distended. No masses palpated. Extremities show trace edema. neuro grossly intact  ECG 11/27/2013-sinus rhythm, left anterior fascicular block.

## 2014-01-23 ENCOUNTER — Encounter: Payer: Self-pay | Admitting: Cardiology

## 2014-01-23 LAB — BASIC METABOLIC PANEL WITH GFR
BUN: 24 mg/dL — AB (ref 6–23)
CHLORIDE: 95 meq/L — AB (ref 96–112)
CO2: 23 mEq/L (ref 19–32)
Calcium: 9.4 mg/dL (ref 8.4–10.5)
Creat: 1.54 mg/dL — ABNORMAL HIGH (ref 0.50–1.35)
GFR, Est African American: 55 mL/min — ABNORMAL LOW
GFR, Est Non African American: 48 mL/min — ABNORMAL LOW
Glucose, Bld: 110 mg/dL — ABNORMAL HIGH (ref 70–99)
POTASSIUM: 4.4 meq/L (ref 3.5–5.3)
SODIUM: 136 meq/L (ref 135–145)

## 2014-01-23 NOTE — Telephone Encounter (Signed)
Returning your Call .[name] Pease Call    Thanks

## 2014-01-25 ENCOUNTER — Other Ambulatory Visit: Payer: PRIVATE HEALTH INSURANCE | Admitting: *Deleted

## 2014-01-25 ENCOUNTER — Encounter: Payer: Self-pay | Admitting: *Deleted

## 2014-01-25 ENCOUNTER — Other Ambulatory Visit: Payer: Self-pay | Admitting: *Deleted

## 2014-01-25 ENCOUNTER — Telehealth: Payer: Self-pay | Admitting: *Deleted

## 2014-01-25 DIAGNOSIS — I2584 Coronary atherosclerosis due to calcified coronary lesion: Principal | ICD-10-CM

## 2014-01-25 DIAGNOSIS — I4891 Unspecified atrial fibrillation: Secondary | ICD-10-CM

## 2014-01-25 DIAGNOSIS — I2510 Atherosclerotic heart disease of native coronary artery without angina pectoris: Secondary | ICD-10-CM

## 2014-01-25 DIAGNOSIS — Z5181 Encounter for therapeutic drug level monitoring: Secondary | ICD-10-CM

## 2014-01-25 NOTE — Telephone Encounter (Signed)
This encounter was created in error - please disregard.

## 2014-01-25 NOTE — Telephone Encounter (Signed)
Note in basket from Deliah Goody that he is scheduled for heart cath on July 23rd and that his INR will be checked when he has labs done at [facility] on July 22nd so does not need visit on July 22  at our clinic.Deliah Goody has instructed pt that last dose of coumadin will be tomorrow and he will restart coumadin July 24th Called to set up an appt for 1 weeks from procedure had to leave message on phone

## 2014-01-28 ENCOUNTER — Encounter (HOSPITAL_COMMUNITY): Payer: Self-pay

## 2014-01-30 LAB — CBC
HEMATOCRIT: 34.8 % — AB (ref 39.0–52.0)
HEMOGLOBIN: 11.4 g/dL — AB (ref 13.0–17.0)
MCH: 26.5 pg (ref 26.0–34.0)
MCHC: 32.8 g/dL (ref 30.0–36.0)
MCV: 80.9 fL (ref 78.0–100.0)
Platelets: 271 10*3/uL (ref 150–400)
RBC: 4.3 MIL/uL (ref 4.22–5.81)
RDW: 14.8 % (ref 11.5–15.5)
WBC: 11.2 10*3/uL — ABNORMAL HIGH (ref 4.0–10.5)

## 2014-01-30 LAB — BASIC METABOLIC PANEL WITH GFR
BUN: 26 mg/dL — ABNORMAL HIGH (ref 6–23)
CO2: 26 meq/L (ref 19–32)
CREATININE: 1.57 mg/dL — AB (ref 0.50–1.35)
Calcium: 9 mg/dL (ref 8.4–10.5)
Chloride: 96 mEq/L (ref 96–112)
GFR, Est African American: 54 mL/min — ABNORMAL LOW
GFR, Est Non African American: 47 mL/min — ABNORMAL LOW
GLUCOSE: 258 mg/dL — AB (ref 70–99)
Potassium: 4.4 mEq/L (ref 3.5–5.3)
Sodium: 132 mEq/L — ABNORMAL LOW (ref 135–145)

## 2014-01-30 LAB — PROTIME-INR
INR: 1.18 (ref <1.50)
PROTHROMBIN TIME: 15 s (ref 11.6–15.2)

## 2014-01-31 ENCOUNTER — Encounter (HOSPITAL_COMMUNITY)
Admission: RE | Disposition: A | Discharge status: Home / Self Care | Payer: Self-pay | Source: Ambulatory Visit | Attending: Cardiovascular Disease

## 2014-01-31 ENCOUNTER — Ambulatory Visit (HOSPITAL_COMMUNITY)
Admission: RE | Admit: 2014-01-31 | Discharge: 2014-01-31 | Disposition: A | Discharge status: Home / Self Care | Payer: PRIVATE HEALTH INSURANCE | Source: Ambulatory Visit | Attending: Cardiovascular Disease | Admitting: Cardiovascular Disease

## 2014-01-31 ENCOUNTER — Other Ambulatory Visit: Payer: Self-pay | Admitting: Cardiology

## 2014-01-31 DIAGNOSIS — E119 Type 2 diabetes mellitus without complications: Secondary | ICD-10-CM | POA: Insufficient documentation

## 2014-01-31 DIAGNOSIS — I252 Old myocardial infarction: Secondary | ICD-10-CM | POA: Insufficient documentation

## 2014-01-31 DIAGNOSIS — Z7901 Long term (current) use of anticoagulants: Secondary | ICD-10-CM | POA: Insufficient documentation

## 2014-01-31 DIAGNOSIS — I129 Hypertensive chronic kidney disease with stage 1 through stage 4 chronic kidney disease, or unspecified chronic kidney disease: Secondary | ICD-10-CM | POA: Insufficient documentation

## 2014-01-31 DIAGNOSIS — I2510 Atherosclerotic heart disease of native coronary artery without angina pectoris: Secondary | ICD-10-CM | POA: Insufficient documentation

## 2014-01-31 DIAGNOSIS — I4891 Unspecified atrial fibrillation: Secondary | ICD-10-CM | POA: Diagnosis not present

## 2014-01-31 DIAGNOSIS — I059 Rheumatic mitral valve disease, unspecified: Secondary | ICD-10-CM | POA: Insufficient documentation

## 2014-01-31 DIAGNOSIS — I509 Heart failure, unspecified: Secondary | ICD-10-CM | POA: Insufficient documentation

## 2014-01-31 DIAGNOSIS — Z794 Long term (current) use of insulin: Secondary | ICD-10-CM | POA: Diagnosis not present

## 2014-01-31 DIAGNOSIS — N183 Chronic kidney disease, stage 3 unspecified: Secondary | ICD-10-CM | POA: Diagnosis not present

## 2014-01-31 DIAGNOSIS — G5600 Carpal tunnel syndrome, unspecified upper limb: Secondary | ICD-10-CM | POA: Insufficient documentation

## 2014-01-31 DIAGNOSIS — R0602 Shortness of breath: Secondary | ICD-10-CM | POA: Diagnosis present

## 2014-01-31 DIAGNOSIS — Z9861 Coronary angioplasty status: Secondary | ICD-10-CM | POA: Insufficient documentation

## 2014-01-31 DIAGNOSIS — G589 Mononeuropathy, unspecified: Secondary | ICD-10-CM | POA: Insufficient documentation

## 2014-01-31 DIAGNOSIS — Z87891 Personal history of nicotine dependence: Secondary | ICD-10-CM | POA: Insufficient documentation

## 2014-01-31 DIAGNOSIS — G4730 Sleep apnea, unspecified: Secondary | ICD-10-CM | POA: Diagnosis not present

## 2014-01-31 DIAGNOSIS — M069 Rheumatoid arthritis, unspecified: Secondary | ICD-10-CM | POA: Insufficient documentation

## 2014-01-31 DIAGNOSIS — I517 Cardiomegaly: Secondary | ICD-10-CM | POA: Insufficient documentation

## 2014-01-31 HISTORY — PX: LEFT AND RIGHT HEART CATHETERIZATION WITH CORONARY ANGIOGRAM: SHX5449

## 2014-01-31 LAB — POCT ACTIVATED CLOTTING TIME: ACTIVATED CLOTTING TIME: 157 s

## 2014-01-31 LAB — POCT I-STAT 3, ART BLOOD GAS (G3+)
Acid-Base Excess: 2 mmol/L (ref 0.0–2.0)
BICARBONATE: 27.8 meq/L — AB (ref 20.0–24.0)
O2 Saturation: 91 %
PCO2 ART: 45.3 mmHg — AB (ref 35.0–45.0)
PH ART: 7.397 (ref 7.350–7.450)
PO2 ART: 63 mmHg — AB (ref 80.0–100.0)
TCO2: 29 mmol/L (ref 0–100)

## 2014-01-31 LAB — POCT I-STAT 3, VENOUS BLOOD GAS (G3P V)
Acid-Base Excess: 3 mmol/L — ABNORMAL HIGH (ref 0.0–2.0)
Bicarbonate: 28.6 mEq/L — ABNORMAL HIGH (ref 20.0–24.0)
O2 Saturation: 61 %
PCO2 VEN: 47.8 mmHg (ref 45.0–50.0)
PH VEN: 7.385 — AB (ref 7.250–7.300)
PO2 VEN: 33 mmHg (ref 30.0–45.0)
TCO2: 30 mmol/L (ref 0–100)

## 2014-01-31 LAB — GLUCOSE, CAPILLARY: GLUCOSE-CAPILLARY: 147 mg/dL — AB (ref 70–99)

## 2014-01-31 SURGERY — LEFT AND RIGHT HEART CATHETERIZATION WITH CORONARY ANGIOGRAM
Anesthesia: LOCAL

## 2014-01-31 MED ORDER — MIDAZOLAM HCL 2 MG/2ML IJ SOLN
INTRAMUSCULAR | Status: AC
  Filled 2014-01-31: qty 2

## 2014-01-31 MED ORDER — OXYCODONE HCL 5 MG PO TABS
ORAL_TABLET | ORAL | Status: AC
  Administered 2014-01-31: 30 mg via ORAL
  Filled 2014-01-31: qty 6

## 2014-01-31 MED ORDER — SODIUM CHLORIDE 0.9 % IJ SOLN: 3.0000 mL | INTRAMUSCULAR | Status: DC | PRN

## 2014-01-31 MED ORDER — ASPIRIN 81 MG PO CHEW
CHEWABLE_TABLET | ORAL | Status: AC
  Filled 2014-01-31: qty 1

## 2014-01-31 MED ORDER — HEPARIN SODIUM (PORCINE) 1000 UNIT/ML IJ SOLN
INTRAMUSCULAR | Status: AC
  Filled 2014-01-31: qty 1

## 2014-01-31 MED ORDER — OXYCODONE HCL 5 MG PO TABS
30.0000 mg | ORAL_TABLET | Freq: Four times a day (QID) | ORAL | Status: DC
  Administered 2014-01-31 (×2): 30 mg via ORAL

## 2014-01-31 MED ORDER — VERAPAMIL HCL 2.5 MG/ML IV SOLN
INTRAVENOUS | Status: AC
  Filled 2014-01-31: qty 2

## 2014-01-31 MED ORDER — SODIUM CHLORIDE 0.9 % IV SOLN: 250.0000 mL | INTRAVENOUS | Status: DC | PRN

## 2014-01-31 MED ORDER — LIDOCAINE HCL (PF) 1 % IJ SOLN
INTRAMUSCULAR | Status: AC
  Filled 2014-01-31: qty 30

## 2014-01-31 MED ORDER — SODIUM CHLORIDE 0.9 % IJ SOLN: 3.0000 mL | Freq: Two times a day (BID) | INTRAMUSCULAR | Status: DC

## 2014-01-31 MED ORDER — SODIUM CHLORIDE 0.9 % IV SOLN
INTRAVENOUS | Status: DC
  Administered 2014-01-31: 08:00:00 via INTRAVENOUS

## 2014-01-31 MED ORDER — NITROGLYCERIN 1 MG/10 ML FOR IR/CATH LAB
INTRA_ARTERIAL | Status: AC
  Filled 2014-01-31: qty 10

## 2014-01-31 MED ORDER — OXYCODONE HCL 5 MG PO TABS
ORAL_TABLET | ORAL | Status: AC
  Filled 2014-01-31: qty 6

## 2014-01-31 MED ORDER — ASPIRIN 81 MG PO CHEW
81.0000 mg | CHEWABLE_TABLET | ORAL | Status: AC
  Administered 2014-01-31: 81 mg via ORAL

## 2014-01-31 MED ORDER — FENTANYL CITRATE 0.05 MG/ML IJ SOLN
INTRAMUSCULAR | Status: AC
  Filled 2014-01-31: qty 2

## 2014-01-31 MED ORDER — HEPARIN (PORCINE) IN NACL 2-0.9 UNIT/ML-% IJ SOLN
INTRAMUSCULAR | Status: AC
  Filled 2014-01-31: qty 1000

## 2014-01-31 NOTE — Discharge Instructions (Signed)
Radial Site Care °Refer to this sheet in the next few weeks. These instructions provide you with information on caring for yourself after your procedure. Your caregiver may also give you more specific instructions. Your treatment has been planned according to current medical practices, but problems sometimes occur. Call your caregiver if you have any problems or questions after your procedure. °HOME CARE INSTRUCTIONS °· You may shower the day after the procedure. Remove the bandage (dressing) and gently wash the site with plain soap and water. Gently pat the site dry. °· Do not apply powder or lotion to the site. °· Do not submerge the affected site in water for 3 to 5 days. °· Inspect the site at least twice daily. °· Do not flex or bend the affected arm for 24 hours. °· No lifting over 5 pounds (2.3 kg) for 5 days after your procedure. °· Do not drive home if you are discharged the same day of the procedure. Have someone else drive you. °· You may drive 24 hours after the procedure unless otherwise instructed by your caregiver. °· Do not operate machinery or power tools for 24 hours. °· A responsible adult should be with you for the first 24 hours after you arrive home. °What to expect: °· Any bruising will usually fade within 1 to 2 weeks. °· Blood that collects in the tissue (hematoma) may be painful to the touch. It should usually decrease in size and tenderness within 1 to 2 weeks. °SEEK IMMEDIATE MEDICAL CARE IF: °· You have unusual pain at the radial site. °· You have redness, warmth, swelling, or pain at the radial site. °· You have drainage (other than a small amount of blood on the dressing). °· You have chills. °· You have a fever or persistent symptoms for more than 72 hours. °· You have a fever and your symptoms suddenly get worse. °· Your arm becomes pale, cool, tingly, or numb. °· You have heavy bleeding from the site. Hold pressure on the site. °Document Released: 07/31/2010 Document Revised:  09/20/2011 Document Reviewed: 07/31/2010 °ExitCare® Patient Information ©2015 [employer]. This information is not intended to replace advice given to you by your health care provider. Make sure you discuss any questions you have with your health care provider. ° °

## 2014-01-31 NOTE — CV Procedure (Signed)
    Cardiac Catheterization Procedure Note  Name: [name] MRN: [id number] DOB: 12-08-1950  Procedure: Right Heart Cath, Left Heart Cath, Selective Coronary Angiography, LV angiography  Indication: Shortness of Breath, Abnormal Myoview with lateral ischemia, moderate risk study   Procedural Details: The right wirst and antecubital fossa was prepped, draped, and anesthetized with 1% lidocaine. Using the modified Seldinger technique a 5 French sheath was placed in the right antecubital vein and a 5/6 French slender sheath was placed in the right radial artery. A Swan-Ganz catheter was used for the right heart catheterization. Standard protocol was followed for recording of right heart pressures and sampling of oxygen saturations. Fick cardiac output was calculated. Standard Judkins catheters were used for selective coronary angiography and left ventriculography. There were no immediate procedural complications. The patient was transferred to the post catheterization recovery area for further monitoring.  Procedural Findings: Hemodynamics RA 15 RV 57/18 PA 53/23 mean 36 PCWP 20 LV 134/23 AO 124/67 mean 91  Oxygen saturations: PA 61 AO 91  Cardiac Output (Fick) 8.0  Cardiac Index (Fick) 2.9   Coronary angiography: Coronary dominance: right  Left mainstem: The left main arises from the left coronary cusp. The vessel is patent and it divides into the left circumflex, LAD, and intermediate branch.  Left anterior descending (LAD): The LAD is patent to the apex. The vessel has diffuse stenosis in its midportion of approximately 40-50%. There is no high-grade disease. The diagonal branches are patent.  Left circumflex (LCx): The left circumflex is patent with a large obtuse marginal branch. The intermediate branch divides into twin vessels and it is also widely patent. The mid AV circumflex just beyond the OM branch has 50% stenosis. The PLA branch is patent.  Right coronary artery  (RCA): The RCA is dominant. The stented segment in the proximal vessel is widely patent. The mid vessel has 30% stenosis his PDA and PLA branches are patent.  Left ventriculography: Left ventricular systolic function is vigorous, LVEF is estimated at 65%, there is no significant mitral regurgitation   Final Conclusions:   1. Elevated right and left heart filling pressures 2. Mild to moderate nonobstructive coronary artery disease of the LAD and left circumflex with a widely patent RCA 3. Normal LV systolic function  Total contrast administered = 75 cc  Tonny Bollman 01/31/2014, 10:10 AM

## 2014-01-31 NOTE — H&P (View-Only) (Signed)
    HPI:  FU atrial fibrillation and CAD. Transesophageal echocardiogram in November of 2010 revealed normal LV function, moderate left atrial enlargement, mild right atrial enlargement, possible left atrial appendage thrombus and mild mitral regurgitation. Note a TSH was low but free T3 and free T4 were normal. He has been followed by endocrinology for this. We scheduled a cardioversion which was performed on June 18, 2009. The procedure was successful. Patient had PCI of his right coronary artery in August of 2012 in [place]. He had a drug-eluting stent. ABIs in April of 2013 were unremarkable. Seen 5/15 for dyspnea; weight gain noted; lasix increased. Echo 6/15 showed normal LV function, mild LAE and grade 1 diastolic dysfunction; cannot R/O small oscillating density on aortic valve. Nuclear study 6/15 showed EF 61 with partially reversible apical lateral defect. Since last seen, He does note dyspnea on exertion. No orthopnea, PND or chest pain. Minimal pedal edema has improved with higher dose diuretics.   Current Outpatient Prescriptions  Medication Sig Dispense Refill  . ACCU-CHEK FASTCLIX LANCETS MISC       . ferrous fumarate (HEMOCYTE - 106 MG FE) 325 (106 FE) MG TABS tablet Take 1 tablet by mouth daily.      . furosemide (LASIX) 40 MG tablet Take 2 tablets (80 mg total) by mouth at bedtime.  30 tablet  6  . gabapentin (NEURONTIN) 300 MG capsule Take 1,200 mg by mouth daily.       . HUMULIN R 500 UNIT/ML SOLN injection Inject 16 Units into the skin 3 (three) times daily with meals.       . lisinopril-hydrochlorothiazide (PRINZIDE,ZESTORETIC) 20-12.5 MG per tablet Take 1 tablet by mouth daily.       . metoprolol tartrate (LOPRESSOR) 25 MG tablet Take 25 mg by mouth at bedtime.      . Multiple Vitamin (MULTIVITAMIN WITH MINERALS) TABS Take 1 tablet by mouth daily.      . oxycodone (ROXICODONE) 30 MG immediate release tablet Take 30 mg by mouth every 4 (four) hours as needed for  pain.      . potassium chloride SA (KLOR-CON M20) 20 MEQ tablet Take 1 tablet (20 mEq total) by mouth daily.  30 tablet  4  . rosuvastatin (CRESTOR) 10 MG tablet Take 20 mg by mouth every evening.       . spironolactone (ALDACTONE) 25 MG tablet Take 1 tablet (25 mg total) by mouth daily.  30 tablet  6  . warfarin (COUMADIN) 5 MG tablet 1 tablet daily except 1.5 tablets on Thursdays or as directed by coumadin clinic  40 tablet  3   No current facility-administered medications for this visit.     Past Medical History  Diagnosis Date  . Diabetes mellitus   . Hypertension   . Rheumatoid arthritis(714.0)   . Neuropathy   . CHF (congestive heart failure)   . History of MI (myocardial infarction)     SILENT MI IN PAST   . CAD (coronary artery disease)   . Atrial fibrillation   . Anemia   . Myocardial infarction     pt stated he had a slient heart attack that showed up on nuclear stress.  . Sleep apnea      SEVERE, NO CPAP USED, DOES NOT KNOW  WHERE SLEEP STUDY DONE  . Sleep apnea     sleep study done last week   dr Koirolla  . Pilonidal cyst 01/25/2013  . Shortness of breath       with ambulation, with exertion  . Chronic kidney disease     CKD stage 3  . Neuropathy   . History of blood transfusion   . Carpal tunnel syndrome, bilateral     Past Surgical History  Procedure Laterality Date  . Abdominal surgery  AGE 7 MONTHS    BENIGN TUMOR REMOVED  . Debridement  foot  FEW YRS AGO    LEFT FOOT, WOUNDS ALL HEALED NOW  . Coronary angioplasty with stent placement  August 2012    RCA DES - [facility]  . Cataract extraction w/phaco Right 11/15/2012    Procedure: CATARACT EXTRACTION PHACO AND INTRAOCULAR LENS PLACEMENT (IOC);  Surgeon: Greer Geiger, MD;  Location: [facility] OR;  Service: Ophthalmology;  Laterality: Right;  . Cataract extraction w/phaco Left 11/29/2012    Procedure: CATARACT EXTRACTION PHACO AND INTRAOCULAR LENS PLACEMENT (IOC);  Surgeon: Greer  Geiger, MD;  Location: [facility] OR;  Service: Ophthalmology;  Laterality: Left;  . Foot surgery    . Pilonidal cyst excision N/A 01/08/2013    Procedure: CYST EXCISION PILONIDAL EXTENSIVE;  Surgeon: Armando Ramirez, MD;  Location: [facility] OR;  Service: General;  Laterality: N/A;  . Foreign body removal Right 2014    Right heel splinter removal   . Eye surgery Bilateral     cataract  . Pars plana vitrectomy Left 06/05/2013    Procedure: PARS PLANA VITRECTOMY WITH 23 GAUGE with Endolaser(constellation);  Surgeon: Greer Geiger, MD;  Location: [facility] OR;  Service: Ophthalmology;  Laterality: Left;    History   Social History  . Marital Status: Single    Spouse Name: N/A    Number of Children: Y  . Years of Education: N/A   Occupational History  . disabled truck driver    Social History Main Topics  . Smoking status: Former Smoker -- 1.00 packs/day for 20 years    Types: Cigarettes    Quit date: 07/13/1983  . Smokeless tobacco: Never Used  . Alcohol Use: No  . Drug Use: No  . Sexual Activity: Not on file   Other Topics Concern  . Not on file   Social History Narrative   Pt is single.   Lives with sister.   Has children.   Was adopted.   Daily cafffiene-2 cups of coffee and 2 sodas per day          ROS: no fevers or chills, productive cough, hemoptysis, dysphasia, odynophagia, melena, hematochezia, dysuria, hematuria, rash, seizure activity, orthopnea, PND, pedal edema, claudication. Remaining systems are negative.  Physical Exam: Well-developed morbidly obese in no acute distress.  Skin is warm and dry.  HEENT is normal.  Neck is supple.  Chest is clear to auscultation with normal expansion.  Cardiovascular exam is regular rate and rhythm.  Abdominal exam nontender or distended. No masses palpated. Extremities show trace edema. neuro grossly intact  ECG 11/27/2013-sinus rhythm, left anterior fascicular block.     

## 2014-01-31 NOTE — Interval H&P Note (Signed)
History and Physical Interval Note:  01/31/2014 9:39 AM  [name]  has presented today for surgery, with the diagnosis of dysmia. cad  The various methods of treatment have been discussed with the patient and family. After consideration of risks, benefits and other options for treatment, the patient has consented to  Procedure(s): LEFT AND RIGHT HEART CATHETERIZATION WITH CORONARY ANGIOGRAM (N/A) as a surgical intervention .  The patient's history has been reviewed, patient examined, no change in status, stable for surgery.  I have reviewed the patient's chart and labs.  Questions were answered to the patient's satisfaction.    Cath Lab Visit (complete for each Cath Lab visit)  Clinical Evaluation Leading to the Procedure:   ACS: No.  Non-ACS:    Anginal Classification: CCS II  Anti-ischemic medical therapy: Minimal Therapy (1 class of medications)  Non-Invasive Test Results: Intermediate-risk stress test findings: cardiac mortality 1-3%/year  Prior CABG: No previous CABG       Tonny Bollman

## 2014-02-01 NOTE — Progress Notes (Signed)
Per Dr Excell Seltzer pt to be discharged home today to self care once TRB protocol complete. Ok to place verbal discharge  order.

## 2014-02-04 ENCOUNTER — Other Ambulatory Visit: Payer: Self-pay | Admitting: *Deleted

## 2014-02-04 DIAGNOSIS — N289 Disorder of kidney and ureter, unspecified: Secondary | ICD-10-CM

## 2014-02-06 ENCOUNTER — Encounter: Payer: Self-pay | Admitting: Cardiology

## 2014-02-06 NOTE — Telephone Encounter (Signed)
Pt said he missed getting his lab work yesterday,just did it today.

## 2014-02-06 NOTE — Telephone Encounter (Signed)
This encounter was created in error - please disregard.

## 2014-02-07 ENCOUNTER — Other Ambulatory Visit: Payer: Self-pay | Admitting: *Deleted

## 2014-02-07 ENCOUNTER — Telehealth: Payer: Self-pay | Admitting: Cardiology

## 2014-02-07 ENCOUNTER — Other Ambulatory Visit: Payer: Self-pay | Admitting: Anesthesiology

## 2014-02-07 DIAGNOSIS — R899 Unspecified abnormal finding in specimens from other organs, systems and tissues: Secondary | ICD-10-CM

## 2014-02-07 DIAGNOSIS — M25511 Pain in right shoulder: Secondary | ICD-10-CM

## 2014-02-07 DIAGNOSIS — Z79899 Other long term (current) drug therapy: Secondary | ICD-10-CM

## 2014-02-07 LAB — BASIC METABOLIC PANEL WITH GFR
BUN: 26 mg/dL — AB (ref 6–23)
CHLORIDE: 95 meq/L — AB (ref 96–112)
CO2: 16 mEq/L — ABNORMAL LOW (ref 19–32)
CREATININE: 3.24 mg/dL — AB (ref 0.50–1.35)
Calcium: 8.3 mg/dL — ABNORMAL LOW (ref 8.4–10.5)
GFR, Est African American: 22 mL/min — ABNORMAL LOW
GFR, Est Non African American: 19 mL/min — ABNORMAL LOW
GLUCOSE: 254 mg/dL — AB (ref 70–99)
Potassium: 3.8 mEq/L (ref 3.5–5.3)
Sodium: 132 mEq/L — ABNORMAL LOW (ref 135–145)

## 2014-02-07 NOTE — Telephone Encounter (Signed)
Annice Pih called to inform that patient [name] (MRN [id number])  has a 3.24 creatinine and wants [place] to make sure that this is reviewed by Dr. Hochrein.(DOD) since Dr. [name] is out of the office. Annice Pih also mentioned this patient recently had a test or procedure and was given dye. Message deferred to Dr. Antoine Poche for review.

## 2014-02-07 NOTE — Telephone Encounter (Signed)
Creat is markedly elevated.  Labs were sent to me today and drawn on 7/27.  Hold Lasix, spironolactone and lisinopril today.  Schedule to be seen in the office tomorrow for add on appt and stat BMET.  We will need to make a determination about his meds at this appt.

## 2014-02-07 NOTE — Telephone Encounter (Signed)
Patient called and notified of abnormal labs. Instructed patient per Dr. Percival Spanish to hold furosemide, spironolactone and lisinopril today. Get repeat B-MET ( STAT)  and be seen tomorrow as a add on. Patient given appointment to see Ellen Henri- PA @ 1:30 tomorrow. Patient voiced his understanding of instructions.

## 2014-02-08 ENCOUNTER — Ambulatory Visit (INDEPENDENT_AMBULATORY_CARE_PROVIDER_SITE_OTHER): Payer: PRIVATE HEALTH INSURANCE | Admitting: Cardiology

## 2014-02-08 ENCOUNTER — Encounter: Payer: Self-pay | Admitting: Cardiology

## 2014-02-08 ENCOUNTER — Ambulatory Visit (INDEPENDENT_AMBULATORY_CARE_PROVIDER_SITE_OTHER): Payer: PRIVATE HEALTH INSURANCE | Admitting: Pharmacist Clinician (PhC)/ Clinical Pharmacy Specialist

## 2014-02-08 VITALS — BP 125/57 | HR 92 | Ht 72.0 in | Wt >= 6400 oz

## 2014-02-08 DIAGNOSIS — R0602 Shortness of breath: Secondary | ICD-10-CM

## 2014-02-08 DIAGNOSIS — I4891 Unspecified atrial fibrillation: Secondary | ICD-10-CM

## 2014-02-08 DIAGNOSIS — Z79899 Other long term (current) drug therapy: Secondary | ICD-10-CM

## 2014-02-08 DIAGNOSIS — Z5181 Encounter for therapeutic drug level monitoring: Secondary | ICD-10-CM

## 2014-02-08 DIAGNOSIS — N289 Disorder of kidney and ureter, unspecified: Secondary | ICD-10-CM

## 2014-02-08 LAB — BASIC METABOLIC PANEL
BUN: 55 mg/dL — AB (ref 6–23)
CALCIUM: 8.3 mg/dL — AB (ref 8.4–10.5)
CO2: 18 mEq/L — ABNORMAL LOW (ref 19–32)
CREATININE: 3.5 mg/dL — AB (ref 0.50–1.35)
Chloride: 101 mEq/L (ref 96–112)
GLUCOSE: 268 mg/dL — AB (ref 70–99)
Potassium: 4.3 mEq/L (ref 3.5–5.3)
Sodium: 138 mEq/L (ref 135–145)

## 2014-02-08 LAB — POCT INR: INR: 1.7

## 2014-02-08 MED ORDER — MAGNESIUM 400 MG PO TABS: 400.0000 mg | ORAL_TABLET | Freq: Every day | ORAL | Status: DC

## 2014-02-08 NOTE — Patient Instructions (Addendum)
Your physician has recommended you make the following change in your medication:   .HOLD ALL FLUID PILLS OVER THE WEEKEND  1. LASIX 2. SPIRONOLACTONE 3.POTASSIUM 4. LISINOPRIL-HCTZ  Your physician recommends that you return for lab work Monday MORNING BMET / BNP BE THERE @8 :30 IF POSSIBLE  KEEP FOLLOW UP APPT WITH  NP  LORI AT [facility] [address] Monday @2 :30

## 2014-02-08 NOTE — Progress Notes (Signed)
Patient ID: [name], male   DOB: [birth year], 63 y.o.   MRN: [id number]    02/08/2014 [name]   09/01/1950  [id number]  Primary Physicia [name], MD Primary Cardiologist: Dr. [name]  HPI:  The patient is a 63 year old morbidly obese male, followed by Dr. [name], with a history of paroxysmal atrial fibrillation, on chronic Coumadin therapy, CAD with prior PCI to his right coronary artery in August of 2012 in [place]. He has a history of normal systolic function but has grade 1 diastolic dysfunction. He recently underwent a diagnostic left heart catheterization by Dr. Excell Seltzer on 01/31/2014 after a nuclear stress study demonstrated partially reversible apical lateral defect. Catheterization revealed nonobstructive CAD with a widely patent RCA stent. Left ventricular ejection fraction was normal at 65%. His serum creatinine day of cath was 1.57. He was instructed to obtain a followup BMET several days post cath. This was performed 7/27 and demonstrated notable increase in his serum creatinine to 3.24. He was instructed to obtain a repeat BMET which was done earlier this morning. His sercum creatinine has increased further to 3.50.  Review of his medications reveal that he is on Lasix, spironolactone as well as lisinopril-hydrochlorothiazide.  He presents to clinic today for followup. Despite his elevated creatinine, he feels that he has been doing fairly well. He denies chest pain, dyspnea, orthopnea, PND and lower extremity edema. He is able to sleep in the supine position and sleeps on one pillow. He is obese and has mild dyspnea on exertion, but denies any increase from his baseline.  He also denies any other high volume loss including no recent vomiting, diarrhea or blood loss.      Current Outpatient Prescriptions  Medication Sig Dispense Refill  . ACCU-CHEK FASTCLIX LANCETS MISC       . Ferrous Sulfate (IRON) 325 (65 FE) MG TABS Take 1 tablet by mouth daily.      .  furosemide (LASIX) 40 MG tablet Take 2 tablets (80 mg total) by mouth at bedtime.  30 tablet  6  . gabapentin (NEURONTIN) 300 MG capsule Take 1,200 mg by mouth daily.       [name] HUMULIN R 500 UNIT/ML SOLN injection Inject 100 Units into the skin 3 (three) times daily with meals.       [name] lisinopril-hydrochlorothiazide (PRINZIDE,ZESTORETIC) 20-12.5 MG per tablet Take 1 tablet by mouth daily.       . Magnesium 400 MG TABS Take 400 mg by mouth daily.      . metoprolol tartrate (LOPRESSOR) 25 MG tablet Take 25 mg by mouth at bedtime.      . Multiple Vitamin (MULTIVITAMIN WITH MINERALS) TABS Take 1 tablet by mouth daily.      [name] oxycodone (ROXICODONE) 30 MG immediate release tablet Take 30 mg by mouth 4 (four) times daily.       . potassium chloride SA (KLOR-CON M20) 20 MEQ tablet Take 1 tablet (20 mEq total) by mouth daily.  30 tablet  4  . rosuvastatin (CRESTOR) 40 MG tablet Take 40 mg by mouth daily.      [name] spironolactone (ALDACTONE) 25 MG tablet Take 25 mg by mouth daily.      [name] warfarin (COUMADIN) 5 MG tablet Take 5-7.5 mg by mouth See admin instructions. 1 tablet daily except 1.5 tablets on Thursdays or as directed by coumadin clinic       No current facility-administered medications for this visit.    No Known Allergies  History  Social History  . Marital Status: Single    Spouse Name: N/A    Number of Children: Y  . Years of Education: N/A   Occupational History  . disabled truck driver    Social History Main Topics  . Smoking status: Former Smoker -- 1.00 packs/day for 20 years    Types: Cigarettes    Quit date: 07/13/1983  . Smokeless tobacco: Never Used  . Alcohol Use: No  . Drug Use: No  . Sexual Activity: Not on file   Other Topics Concern  . Not on file   Social History Narrative   Pt is single.   Lives with sister.   Has children.   Was adopted.   Daily cafffiene-2 cups of coffee and 2 sodas per day           Review of Systems: General: negative for chills,  fever, night sweats or weight changes.  Cardiovascular: negative for chest pain, dyspnea on exertion, edema, orthopnea, palpitations, paroxysmal nocturnal dyspnea or shortness of breath Dermatological: negative for rash Respiratory: negative for cough or wheezing Urologic: negative for hematuria Abdominal: negative for nausea, vomiting, diarrhea, bright red blood per rectum, melena, or hematemesis Neurologic: negative for visual changes, syncope, or dizziness All other systems reviewed and are otherwise negative except as noted above.    Blood pressure 125/57, pulse 92, height 6' (1.829 m), weight 402 lb (182.346 kg).  General appearance: alert, cooperative and no distress, morbidly obese  Neck: no carotid bruit and no JVD Lungs: clear to auscultation bilaterally Heart: regular rate and rhythm, S1, S2 normal, no murmur, click, rub or gallop Extremities: no LEE Pulses: 2+ and symmetric Skin: warm and dry Neurologic: Grossly normal  EKG NSR, 92 bpm  ASSESSMENT AND PLAN:   1. AKI: Scr has increased to 3.5in the setting of recent contrast dye expose from cardiac cath plus diuretic and ACE-I therapy. Will hold Lasix, spironolactone, HCTZ and lisinopril over the weekend. I have ordered a STAT BMET for him to obtain Monday am (02/11/14). Hopefully labs will be resulted before his 2:30 f/u appt with [name], NP. Will also hold potassium over the weekend as well.   2. Chronic diastolic HF: He appears euvolemic on physical exam and denies dyspnea/orthopnea/LEE/weight gain. Will hold diuretics due to problem #1. He has been instructed to refrain from sodium over the weekend and to monitor his weight daily. He is to call if he develops significant weight gain and/or SOB while off diuretics. Hopefully renal function will improve and diuretics can be restarted in several days.   3. PAF: NSR.  4. Chronic oral anticoagulation: INR subtherapeutic at 1.7. Dosing instructions were provided by  pharmacist.     [name], [facility] 02/08/2014 1:38 PM

## 2014-02-08 NOTE — Telephone Encounter (Signed)
This encounter was created in error - please disregard.

## 2014-02-11 ENCOUNTER — Encounter: Payer: Self-pay | Admitting: Nurse Practitioner

## 2014-02-11 ENCOUNTER — Inpatient Hospital Stay (HOSPITAL_COMMUNITY)
Admission: AD | Admit: 2014-02-11 | Discharge: 2014-02-14 | DRG: 292 | Disposition: A | Discharge status: Home / Self Care | Payer: PRIVATE HEALTH INSURANCE | Source: Ambulatory Visit | Attending: Cardiology | Admitting: Cardiology

## 2014-02-11 ENCOUNTER — Telehealth: Payer: Self-pay | Admitting: Cardiology

## 2014-02-11 ENCOUNTER — Ambulatory Visit (INDEPENDENT_AMBULATORY_CARE_PROVIDER_SITE_OTHER): Payer: PRIVATE HEALTH INSURANCE | Admitting: Nurse Practitioner

## 2014-02-11 ENCOUNTER — Other Ambulatory Visit: Payer: Self-pay | Admitting: Nurse Practitioner

## 2014-02-11 ENCOUNTER — Encounter (HOSPITAL_COMMUNITY): Payer: Self-pay | Admitting: *Deleted

## 2014-02-11 VITALS — BP 160/60 | HR 104 | Ht 72.0 in | Wt >= 6400 oz

## 2014-02-11 DIAGNOSIS — E1129 Type 2 diabetes mellitus with other diabetic kidney complication: Secondary | ICD-10-CM | POA: Diagnosis present

## 2014-02-11 DIAGNOSIS — Z9861 Coronary angioplasty status: Secondary | ICD-10-CM

## 2014-02-11 DIAGNOSIS — I5031 Acute diastolic (congestive) heart failure: Secondary | ICD-10-CM

## 2014-02-11 DIAGNOSIS — N183 Chronic kidney disease, stage 3 unspecified: Secondary | ICD-10-CM | POA: Diagnosis present

## 2014-02-11 DIAGNOSIS — Z79899 Other long term (current) drug therapy: Secondary | ICD-10-CM

## 2014-02-11 DIAGNOSIS — Z6841 Body Mass Index (BMI) 40.0 and over, adult: Secondary | ICD-10-CM

## 2014-02-11 DIAGNOSIS — E66813 Obesity, class 3: Secondary | ICD-10-CM | POA: Diagnosis present

## 2014-02-11 DIAGNOSIS — I129 Hypertensive chronic kidney disease with stage 1 through stage 4 chronic kidney disease, or unspecified chronic kidney disease: Secondary | ICD-10-CM | POA: Diagnosis present

## 2014-02-11 DIAGNOSIS — D649 Anemia, unspecified: Secondary | ICD-10-CM

## 2014-02-11 DIAGNOSIS — E8779 Other fluid overload: Secondary | ICD-10-CM

## 2014-02-11 DIAGNOSIS — Z87891 Personal history of nicotine dependence: Secondary | ICD-10-CM

## 2014-02-11 DIAGNOSIS — M069 Rheumatoid arthritis, unspecified: Secondary | ICD-10-CM | POA: Diagnosis present

## 2014-02-11 DIAGNOSIS — I5033 Acute on chronic diastolic (congestive) heart failure: Principal | ICD-10-CM | POA: Diagnosis present

## 2014-02-11 DIAGNOSIS — I2510 Atherosclerotic heart disease of native coronary artery without angina pectoris: Secondary | ICD-10-CM | POA: Diagnosis present

## 2014-02-11 DIAGNOSIS — I4891 Unspecified atrial fibrillation: Secondary | ICD-10-CM | POA: Diagnosis present

## 2014-02-11 DIAGNOSIS — Z794 Long term (current) use of insulin: Secondary | ICD-10-CM

## 2014-02-11 DIAGNOSIS — Z9849 Cataract extraction status, unspecified eye: Secondary | ICD-10-CM

## 2014-02-11 DIAGNOSIS — I5032 Chronic diastolic (congestive) heart failure: Secondary | ICD-10-CM | POA: Diagnosis present

## 2014-02-11 DIAGNOSIS — N058 Unspecified nephritic syndrome with other morphologic changes: Secondary | ICD-10-CM | POA: Diagnosis present

## 2014-02-11 DIAGNOSIS — R0600 Dyspnea, unspecified: Secondary | ICD-10-CM | POA: Diagnosis present

## 2014-02-11 DIAGNOSIS — N189 Chronic kidney disease, unspecified: Secondary | ICD-10-CM

## 2014-02-11 DIAGNOSIS — G4733 Obstructive sleep apnea (adult) (pediatric): Secondary | ICD-10-CM | POA: Diagnosis present

## 2014-02-11 DIAGNOSIS — E785 Hyperlipidemia, unspecified: Secondary | ICD-10-CM | POA: Diagnosis present

## 2014-02-11 DIAGNOSIS — E1165 Type 2 diabetes mellitus with hyperglycemia: Secondary | ICD-10-CM | POA: Diagnosis present

## 2014-02-11 DIAGNOSIS — I509 Heart failure, unspecified: Secondary | ICD-10-CM | POA: Diagnosis present

## 2014-02-11 DIAGNOSIS — I10 Essential (primary) hypertension: Secondary | ICD-10-CM

## 2014-02-11 DIAGNOSIS — I252 Old myocardial infarction: Secondary | ICD-10-CM

## 2014-02-11 DIAGNOSIS — Z7901 Long term (current) use of anticoagulants: Secondary | ICD-10-CM

## 2014-02-11 DIAGNOSIS — E782 Mixed hyperlipidemia: Secondary | ICD-10-CM | POA: Diagnosis present

## 2014-02-11 DIAGNOSIS — Z961 Presence of intraocular lens: Secondary | ICD-10-CM

## 2014-02-11 DIAGNOSIS — N289 Disorder of kidney and ureter, unspecified: Secondary | ICD-10-CM

## 2014-02-11 HISTORY — DX: Morbid (severe) obesity due to excess calories: E66.01

## 2014-02-11 LAB — CBC WITH DIFFERENTIAL/PLATELET
Basophils Absolute: 0 10*3/uL (ref 0.0–0.1)
Basophils Relative: 0 % (ref 0–1)
Eosinophils Absolute: 0.4 10*3/uL (ref 0.0–0.7)
Eosinophils Relative: 4 % (ref 0–5)
HCT: 32.9 % — ABNORMAL LOW (ref 39.0–52.0)
Hemoglobin: 10.8 g/dL — ABNORMAL LOW (ref 13.0–17.0)
Lymphocytes Relative: 15 % (ref 12–46)
Lymphs Abs: 1.5 10*3/uL (ref 0.7–4.0)
MCH: 27.6 pg (ref 26.0–34.0)
MCHC: 32.8 g/dL (ref 30.0–36.0)
MCV: 84.1 fL (ref 78.0–100.0)
Monocytes Absolute: 0.9 10*3/uL (ref 0.1–1.0)
Monocytes Relative: 9 % (ref 3–12)
Neutro Abs: 7.6 10*3/uL (ref 1.7–7.7)
Neutrophils Relative %: 72 % (ref 43–77)
Platelets: 253 10*3/uL (ref 150–400)
RBC: 3.91 MIL/uL — ABNORMAL LOW (ref 4.22–5.81)
RDW: 14.7 % (ref 11.5–15.5)
WBC: 10.4 10*3/uL (ref 4.0–10.5)

## 2014-02-11 LAB — MAGNESIUM: Magnesium: 1.7 mg/dL (ref 1.5–2.5)

## 2014-02-11 LAB — COMPREHENSIVE METABOLIC PANEL
ALT: 46 U/L (ref 0–53)
AST: 38 U/L — ABNORMAL HIGH (ref 0–37)
Albumin: 3.1 g/dL — ABNORMAL LOW (ref 3.5–5.2)
Alkaline Phosphatase: 63 U/L (ref 39–117)
Anion gap: 14 (ref 5–15)
BUN: 35 mg/dL — ABNORMAL HIGH (ref 6–23)
CO2: 26 mEq/L (ref 19–32)
Calcium: 8.8 mg/dL (ref 8.4–10.5)
Chloride: 100 mEq/L (ref 96–112)
Creatinine, Ser: 1.67 mg/dL — ABNORMAL HIGH (ref 0.50–1.35)
GFR calc Af Amer: 49 mL/min — ABNORMAL LOW (ref >90)
GFR calc non Af Amer: 42 mL/min — ABNORMAL LOW (ref >90)
Glucose, Bld: 283 mg/dL — ABNORMAL HIGH (ref 70–99)
Potassium: 4.8 mEq/L (ref 3.7–5.3)
Sodium: 140 mEq/L (ref 137–147)
Total Bilirubin: 0.5 mg/dL (ref 0.3–1.2)
Total Protein: 6.8 g/dL (ref 6.0–8.3)

## 2014-02-11 LAB — PROTIME-INR
INR: 1.83 — ABNORMAL HIGH (ref 0.00–1.49)
Prothrombin Time: 21.2 seconds — ABNORMAL HIGH (ref 11.6–15.2)

## 2014-02-11 LAB — BRAIN NATRIURETIC PEPTIDE: BRAIN NATRIURETIC PEPTIDE: 254.3 pg/mL — AB (ref 0.0–100.0)

## 2014-02-11 LAB — GLUCOSE, CAPILLARY: GLUCOSE-CAPILLARY: 286 mg/dL — AB (ref 70–99)

## 2014-02-11 LAB — TSH: TSH: 0.408 u[IU]/mL (ref 0.350–4.500)

## 2014-02-11 LAB — APTT: aPTT: 44 seconds — ABNORMAL HIGH (ref 24–37)

## 2014-02-11 MED ORDER — GABAPENTIN 800 MG PO TABS
400.0000 mg | ORAL_TABLET | Freq: Three times a day (TID) | ORAL | Status: DC
  Filled 2014-02-11 (×2): qty 0.5

## 2014-02-11 MED ORDER — ATORVASTATIN CALCIUM 20 MG PO TABS
20.0000 mg | ORAL_TABLET | Freq: Every day | ORAL | Status: DC
  Administered 2014-02-11 – 2014-02-13 (×3): 20 mg via ORAL
  Filled 2014-02-11 (×4): qty 1

## 2014-02-11 MED ORDER — SODIUM CHLORIDE 0.9 % IJ SOLN: 3.0000 mL | INTRAMUSCULAR | Status: DC | PRN

## 2014-02-11 MED ORDER — FERROUS SULFATE 325 (65 FE) MG PO TABS
325.0000 mg | ORAL_TABLET | Freq: Every day | ORAL | Status: DC
  Administered 2014-02-12 – 2014-02-14 (×3): 325 mg via ORAL
  Filled 2014-02-11 (×5): qty 1

## 2014-02-11 MED ORDER — METOPROLOL TARTRATE 12.5 MG HALF TABLET
12.5000 mg | ORAL_TABLET | Freq: Two times a day (BID) | ORAL | Status: DC
  Administered 2014-02-11 – 2014-02-14 (×6): 12.5 mg via ORAL
  Filled 2014-02-11 (×7): qty 1

## 2014-02-11 MED ORDER — ONDANSETRON HCL 4 MG/2ML IJ SOLN: 4.0000 mg | Freq: Four times a day (QID) | INTRAMUSCULAR | Status: DC | PRN

## 2014-02-11 MED ORDER — FUROSEMIDE 10 MG/ML IJ SOLN
60.0000 mg | Freq: Two times a day (BID) | INTRAMUSCULAR | Status: DC
Start: 2014-02-11 — End: 2014-02-12
  Administered 2014-02-11: 60 mg via INTRAVENOUS
  Filled 2014-02-11 (×3): qty 6

## 2014-02-11 MED ORDER — OXYCODONE HCL 5 MG PO TABS
30.0000 mg | ORAL_TABLET | Freq: Four times a day (QID) | ORAL | Status: DC | PRN
  Administered 2014-02-11 – 2014-02-14 (×11): 30 mg via ORAL
  Filled 2014-02-11 (×11): qty 6

## 2014-02-11 MED ORDER — ACETAMINOPHEN 325 MG PO TABS: 650.0000 mg | ORAL_TABLET | ORAL | Status: DC | PRN

## 2014-02-11 MED ORDER — SODIUM CHLORIDE 0.9 % IJ SOLN
3.0000 mL | Freq: Two times a day (BID) | INTRAMUSCULAR | Status: DC
  Administered 2014-02-11 – 2014-02-14 (×6): 3 mL via INTRAVENOUS

## 2014-02-11 MED ORDER — INSULIN REGULAR HUMAN 100 UNIT/ML IJ SOLN: 16.0000 [IU] | Freq: Three times a day (TID) | INTRAMUSCULAR | Status: DC

## 2014-02-11 MED ORDER — SODIUM CHLORIDE 0.9 % IV SOLN: 250.0000 mL | INTRAVENOUS | Status: DC | PRN

## 2014-02-11 MED ORDER — OXYCODONE HCL 5 MG PO TABS
10.0000 mg | ORAL_TABLET | Freq: Four times a day (QID) | ORAL | Status: DC | PRN
  Administered 2014-02-11: 10 mg via ORAL
  Filled 2014-02-11: qty 2

## 2014-02-11 MED ORDER — FUROSEMIDE 10 MG/ML IJ SOLN
INTRAMUSCULAR | Status: AC
  Filled 2014-02-11: qty 8

## 2014-02-11 MED ORDER — GABAPENTIN 400 MG PO CAPS
400.0000 mg | ORAL_CAPSULE | Freq: Three times a day (TID) | ORAL | Status: DC
  Administered 2014-02-11 – 2014-02-14 (×9): 400 mg via ORAL
  Filled 2014-02-11 (×11): qty 1

## 2014-02-11 MED ORDER — INSULIN ASPART 100 UNIT/ML ~~LOC~~ SOLN
16.0000 [IU] | Freq: Three times a day (TID) | SUBCUTANEOUS | Status: DC
  Administered 2014-02-11 – 2014-02-12 (×2): 16 [IU] via SUBCUTANEOUS

## 2014-02-11 NOTE — Telephone Encounter (Signed)
Did not get his BMET - more short of breath - admitted from the office.

## 2014-02-11 NOTE — Telephone Encounter (Signed)
Pt has an appointment today at 2:30pm with lori gerhardt np, will make her aware of results.

## 2014-02-11 NOTE — Patient Instructions (Signed)
We are going to admit you to the hospital.  

## 2014-02-11 NOTE — Telephone Encounter (Signed)
Critical BNP of 254.3 received.   Message forwarded to Dr. Ludwig Clarks nurse, Stanton Kidney

## 2014-02-11 NOTE — Progress Notes (Signed)
Pt.is a direct admit from the MD office. He is A/Ox4 and is ambulatory. He was transported by EMS to the nursing unit and he is on 2 L  of oxygen for dyspnea with exertion. He had c/o generalized pain and lower chronic back pain. Prn pain medication was given. Pt.says he has chronic pain and takes 30 mg of oxycodone four times a day at home. Called pharmacy about med.rec. Also paged answering service with [facility] twice to page MD on call to notify about patients pain medication request. Never received a page back reported to oncoming RN for follow up.

## 2014-02-11 NOTE — Progress Notes (Signed)
Kristen Cardinalobert C Staunton Date of Birth: 16-Feb-1951 Medical Record #[id number]  History of Present Illness: Mr. Tiburcio PeaHarris is seen back today for a post cath visit. Seen for Dr. Jens Somrenshaw. He is a 63 year old male with AF and CAD. He has been previously cardioverted back in 2010. He has had PCI of the RCA in 2012 with DES. Last EF normal in June of 2015. Small oscillating density on the aortic valve could not be ruled out. Myoview from June of 2015 with partially reversible apical lateral defect. Other issues include CKD, morbid obesity, deconditioning and OSA.   Seen last month by Dr. Jens Somrenshaw with worsening dyspnea. Was cathed on 7/23. Elevated pressures with mild to moderate disease noted. 75 cc contrast given. Has had worsening kidney function since then - up to 3.5 as of Friday.  Has had his lasix, ACE and aldactone held since last Friday -  Last doses were on Thursday.   Comes in today. More short of breath. Hard to move around. Had lab this morning - no BMET and only a BNP noted. More swollen in a generalized fashion. Weight up. No chest pain. Could hardly make it up to the office. Sleeping off and on. No chest pain.   Current Outpatient Prescriptions  Medication Sig Dispense Refill  . ACCU-CHEK FASTCLIX LANCETS MISC       . Ferrous Sulfate (IRON) 325 (65 FE) MG TABS Take 1 tablet by mouth daily.      [name]. gabapentin (NEURONTIN) 300 MG capsule Take 1,200 mg by mouth daily.       [name]. HUMULIN R 500 UNIT/ML SOLN injection Inject 100 Units into the skin 3 (three) times daily with meals.       . Magnesium 400 MG TABS Take 400 mg by mouth daily.  30 tablet  11  . metoprolol tartrate (LOPRESSOR) 25 MG tablet Take 25 mg by mouth at bedtime.      . Multiple Vitamin (MULTIVITAMIN WITH MINERALS) TABS Take 1 tablet by mouth daily.      [name]. oxycodone (ROXICODONE) 30 MG immediate release tablet Take 30 mg by mouth 4 (four) times daily.       . rosuvastatin (CRESTOR) 40 MG tablet Take 40 mg by mouth daily.      [name]. warfarin  (COUMADIN) 5 MG tablet Take 5-7.5 mg by mouth See admin instructions. 1 tablet daily except 1.5 tablets on Thursdays or as directed by coumadin clinic      . furosemide (LASIX) 40 MG tablet Take 40 mg by mouth daily.      [name]. lisinopril-hydrochlorothiazide (PRINZIDE,ZESTORETIC) 20-12.5 MG per tablet Take 1 tablet by mouth daily.      . potassium chloride SA (KLOR-CON M20) 20 MEQ tablet Take 1 tablet (20 mEq total) by mouth daily.  30 tablet  4  . spironolactone (ALDACTONE) 25 MG tablet Take 25 mg by mouth daily.       No current facility-administered medications for this visit.    No Known Allergies  Past Medical History  Diagnosis Date  . Diabetes mellitus   . Hypertension   . Rheumatoid arthritis(714.0)   . Neuropathy   . CHF (congestive heart failure)   . History of MI (myocardial infarction)     SILENT MI IN PAST   . CAD (coronary artery disease)   . Atrial fibrillation   . Anemia   . Myocardial infarction     pt stated he had a slient heart attack that showed up on nuclear stress.  .[name]  Sleep apnea      SEVERE, NO CPAP USED, DOES NOT KNOW  WHERE SLEEP STUDY DONE  . Sleep apnea     sleep study done last week   dr Tilman Neat  . Pilonidal cyst 01/25/2013  . Shortness of breath     with ambulation, with exertion  . Chronic kidney disease     CKD stage 3  . Neuropathy   . History of blood transfusion   . Carpal tunnel syndrome, bilateral     Past Surgical History  Procedure Laterality Date  . Abdominal surgery  AGE 28 MONTHS    BENIGN TUMOR REMOVED  . Debridement  foot  FEW YRS AGO    LEFT FOOT, WOUNDS ALL HEALED NOW  . Coronary angioplasty with stent placement  August 2012    RCA DES - [facility]  . Cataract extraction w/phaco Right 11/15/2012    Procedure: CATARACT EXTRACTION PHACO AND INTRAOCULAR LENS PLACEMENT (IOC);  Surgeon: Shade Flood, MD;  Location: [facility] OR;  Service: Ophthalmology;  Laterality: Right;  . Cataract extraction w/phaco Left  11/29/2012    Procedure: CATARACT EXTRACTION PHACO AND INTRAOCULAR LENS PLACEMENT (IOC);  Surgeon: Shade Flood, MD;  Location: [facility] OR;  Service: Ophthalmology;  Laterality: Left;  . Foot surgery    . Pilonidal cyst excision N/A 01/08/2013    Procedure: CYST EXCISION PILONIDAL EXTENSIVE;  Surgeon: Axel Filler, MD;  Location: [facility] OR;  Service: General;  Laterality: N/A;  . Foreign body removal Right 2014    Right heel splinter removal   . Eye surgery Bilateral     cataract  . Pars plana vitrectomy Left 06/05/2013    Procedure: PARS PLANA VITRECTOMY WITH 23 GAUGE with Endolaser(constellation);  Surgeon: Shade Flood, MD;  Location: [facility] OR;  Service: Ophthalmology;  Laterality: Left;    History  Smoking status  . Former Smoker -- 1.00 packs/day for 20 years  . Types: Cigarettes  . Quit date: 07/13/1983  Smokeless tobacco  . Never Used    History  Alcohol Use No    Family History  Problem Relation Age of Onset  . Other Other     PT ADOPTED    Review of Systems: The review of systems is per the HPI.  All other systems were reviewed and are negative.  Physical Exam: BP 160/60  Pulse 104  Ht 6' (1.829 m)  Wt 400 lb 6.4 oz (181.62 kg)  BMI 54.29 kg/m2  SpO2 91% Patient is very pleasant and in mild to moderate distress. Extremely short of breath with just ambulating to the bathroom next to the exam room. Weight is up since his cath by about 10 pounds. Skin is warm and dry. Color is normal.  HEENT is unremarkable. Normocephalic/atraumatic. PERRL. Sclera are nonicteric. Neck is supple. No masses. No JVD. Lungs show decreased breath sounds. Heart tones are very distant. Abdomen is obese but soft. Extremities are with 1+ edema. Gait and ROM are intact. No gross neurologic deficits noted.  Wt Readings from Last 3 Encounters:  02/11/14 400 lb 6.4 oz (181.62 kg)  02/08/14 402 lb (182.346 kg)  01/31/14 390 lb (176.903 kg)    LABORATORY DATA/PROCEDURES:  Lab Results  Component Value  Date   WBC 11.2* 01/30/2014   HGB 11.4* 01/30/2014   HCT 34.8* 01/30/2014   PLT 271 01/30/2014   GLUCOSE 268* 02/08/2014   CHOL 143 08/31/2011   TRIG 212.0* 08/31/2011   HDL 37.30* 08/31/2011   LDLDIRECT 82.1 08/31/2011  LDLCALC 101* 11/06/2009   ALT 61* 11/27/2013   AST 61* 11/27/2013   NA 138 02/08/2014   K 4.3 02/08/2014   CL 101 02/08/2014   CREATININE 3.50* 02/08/2014   BUN 55* 02/08/2014   CO2 18* 02/08/2014   TSH 0.49 11/27/2013   PSA 0.68 10/01/2008   INR 1.7 02/08/2014   HGBA1C 10.1* 09/08/2012   MICROALBUR 6.24* 04/07/2009    BNP (last 3 results)  Recent Labs  11/27/13 1223  PROBNP 54.0     Brain Natriuretic Peptide 0.0 - 100.0 pg/mL  254.3 (H)     Cardiac Catheterization Procedure Note  Name: CINSERE MIZRAHI  MRN: [id number]  DOB: 1950/08/13  Procedure: Right Heart Cath, Left Heart Cath, Selective Coronary Angiography, LV angiography  Indication: Shortness of Breath, Abnormal Myoview with lateral ischemia, moderate risk study  Procedural Details: The right wirst and antecubital fossa was prepped, draped, and anesthetized with 1% lidocaine. Using the modified Seldinger technique a 5 French sheath was placed in the right antecubital vein and a 5/6 French slender sheath was placed in the right radial artery. A Swan-Ganz catheter was used for the right heart catheterization. Standard protocol was followed for recording of right heart pressures and sampling of oxygen saturations. Fick cardiac output was calculated. Standard Judkins catheters were used for selective coronary angiography and left ventriculography. There were no immediate procedural complications. The patient was transferred to the post catheterization recovery area for further monitoring.  Procedural Findings:  Hemodynamics  RA 15  RV 57/18  PA 53/23 mean 36  PCWP 20  LV 134/23  AO 124/67 mean 91  Oxygen saturations:  PA 61  AO 91  Cardiac Output (Fick) 8.0  Cardiac Index (Fick) 2.9  Coronary angiography:    Coronary dominance: right  Left mainstem: The left main arises from the left coronary cusp. The vessel is patent and it divides into the left circumflex, LAD, and intermediate branch.  Left anterior descending (LAD): The LAD is patent to the apex. The vessel has diffuse stenosis in its midportion of approximately 40-50%. There is no high-grade disease. The diagonal branches are patent.  Left circumflex (LCx): The left circumflex is patent with a large obtuse marginal branch. The intermediate branch divides into twin vessels and it is also widely patent. The mid AV circumflex just beyond the OM branch has 50% stenosis. The PLA branch is patent.  Right coronary artery (RCA): The RCA is dominant. The stented segment in the proximal vessel is widely patent. The mid vessel has 30% stenosis his PDA and PLA branches are patent.  Left ventriculography: Left ventricular systolic function is vigorous, LVEF is estimated at 65%, there is no significant mitral regurgitation  Final Conclusions:  1. Elevated right and left heart filling pressures  2. Mild to moderate nonobstructive coronary artery disease of the LAD and left circumflex with a widely patent RCA  3. Normal LV systolic function  Total contrast administered = 75 cc  Tonny Bollman  01/31/2014, 10:10 AM   Assessment / Plan: 1. Progressive DOE - most likely with progressive CKD/diastolic heart failure. He has not had his BMET drawn today. Now late in the day. Shortness of breath has gotten worse and he is very short of breath with minimal walking here in the office. Do not think we can get this turned around with oral Lasix. Discussed with Dr. Katrinka Blazing (DOD) who has seen him as well - agrees with admission to the hospital for IV diuresis and recheck of his  labs. Possible renal consult.   2. Recent cath - elevated right and left heart filling pressures - will need IV lasix.  3. Mild to moderate CAD per recent cath.  4. Morbid obesity  Will plan for  admission to [facility] - observation. IV Lasix. May need to consider renal consult. Will transfer by EMS  Patient is agreeable to this plan and will call if any problems develop in the interim.   Rosalio Macadamia, RN, ANP-C [facility] [address] [place], [place]  [address] [phone]

## 2014-02-11 NOTE — H&P (Signed)
[name]  Date of Birth: July 07, 1951  Medical Record #[id number]  History of Present Illness:  [name] is seen back today for a post cath visit. Seen for Dr. Jens Somrenshaw. He is a 63 year old male with AF and CAD. He has been previously cardioverted back in 2010. He has had PCI of the RCA in 2012 with DES. Last EF normal in June of 2015. Small oscillating density on the aortic valve could not be ruled out. Myoview from June of 2015 with partially reversible apical lateral defect. Other issues include CKD, morbid obesity, deconditioning and OSA.  Seen last month by Dr. Jens Somrenshaw with worsening dyspnea. Was cathed on 7/23. Elevated pressures with mild to moderate disease noted. 75 cc contrast given. Has had worsening kidney function since then - up to 3.5 as of Friday. Has had his lasix, ACE and aldactone held since last Friday - Last doses were on Thursday.  Comes in today. More short of breath. Hard to move around. Had lab this morning - no BMET and only a BNP noted. More swollen in a generalized fashion. Weight up. No chest pain. Could hardly make it up to the office. Sleeping off and on. No chest pain.  Current Outpatient Prescriptions   Medication  Sig  Dispense  Refill   .  ACCU-CHEK FASTCLIX LANCETS MISC      .  Ferrous Sulfate (IRON) 325 (65 FE) MG TABS  Take 1 tablet by mouth daily.     [name].  gabapentin (NEURONTIN) 300 MG capsule  Take 1,200 mg by mouth daily.     [name].  HUMULIN R 500 UNIT/ML SOLN injection  Inject 100 Units into the skin 3 (three) times daily with meals.     .  Magnesium 400 MG TABS  Take 400 mg by mouth daily.  30 tablet  11   .  metoprolol tartrate (LOPRESSOR) 25 MG tablet  Take 25 mg by mouth at bedtime.     .  Multiple Vitamin (MULTIVITAMIN WITH MINERALS) TABS  Take 1 tablet by mouth daily.     [name].  oxycodone (ROXICODONE) 30 MG immediate release tablet  Take 30 mg by mouth 4 (four) times daily.     .  rosuvastatin (CRESTOR) 40 MG tablet  Take 40 mg by mouth daily.     [name].   warfarin (COUMADIN) 5 MG tablet  Take 5-7.5 mg by mouth See admin instructions. 1 tablet daily except 1.5 tablets on Thursdays or as directed by coumadin clinic     .  furosemide (LASIX) 40 MG tablet  Take 40 mg by mouth daily.     [name].  lisinopril-hydrochlorothiazide (PRINZIDE,ZESTORETIC) 20-12.5 MG per tablet  Take 1 tablet by mouth daily.     .  potassium chloride SA (KLOR-CON M20) 20 MEQ tablet  Take 1 tablet (20 mEq total) by mouth daily.  30 tablet  4   .  spironolactone (ALDACTONE) 25 MG tablet  Take 25 mg by mouth daily.      No current facility-administered medications for this visit.   No Known Allergies  Past Medical History   Diagnosis  Date   .  Diabetes mellitus    .  Hypertension    .  Rheumatoid arthritis(714.0)    .  Neuropathy    .  CHF (congestive heart failure)    .  History of MI (myocardial infarction)      SILENT MI IN PAST   .  CAD (coronary artery disease)    .  Sleep apnea      SEVERE, NO CPAP USED, DOES NOT KNOW  WHERE SLEEP STUDY DONE  . Sleep apnea     sleep study done last week   dr Koirolla  . Pilonidal cyst 01/25/2013  . Shortness of breath     with ambulation, with exertion  . Chronic kidney disease     CKD stage 3  . Neuropathy   . History of blood transfusion   . Carpal tunnel syndrome, bilateral     Past Surgical History  Procedure Laterality Date  . Abdominal surgery  AGE 7 MONTHS    BENIGN TUMOR REMOVED  . Debridement  foot  FEW YRS AGO    LEFT FOOT, WOUNDS ALL HEALED NOW  . Coronary angioplasty with stent placement  August 2012    RCA DES - [facility]  . Cataract extraction w/phaco Right 11/15/2012    Procedure: CATARACT EXTRACTION PHACO AND INTRAOCULAR LENS PLACEMENT (IOC);  Surgeon: Greer Geiger, MD;  Location: [facility] OR;  Service: Ophthalmology;  Laterality: Right;  . Cataract extraction w/phaco Left  11/29/2012    Procedure: CATARACT EXTRACTION PHACO AND INTRAOCULAR LENS PLACEMENT (IOC);  Surgeon: Greer Geiger, MD;  Location: [facility] OR;  Service: Ophthalmology;  Laterality: Left;  . Foot surgery    . Pilonidal cyst excision N/A 01/08/2013    Procedure: CYST EXCISION PILONIDAL EXTENSIVE;  Surgeon: Armando Ramirez, MD;  Location: [facility] OR;  Service: General;  Laterality: N/A;  . Foreign body removal Right 2014    Right heel splinter removal   . Eye surgery Bilateral     cataract  . Pars plana vitrectomy Left 06/05/2013    Procedure: PARS PLANA VITRECTOMY WITH 23 GAUGE with Endolaser(constellation);  Surgeon: Greer Geiger, MD;  Location: [facility] OR;  Service: Ophthalmology;  Laterality: Left;    History  Smoking status  . Former Smoker -- 1.00 packs/day for 20 years  . Types: Cigarettes  . Quit date: 07/13/1983  Smokeless tobacco  . Never Used    History  Alcohol Use No    Family History  Problem Relation Age of Onset  . Other Other     PT ADOPTED    Review of Systems: The review of systems is per the HPI.  All other systems were reviewed and are negative.  Physical Exam: BP 160/60  Pulse 104  Ht 6' (1.829 m)  Wt 400 lb 6.4 oz (181.62 kg)  BMI 54.29 kg/m2  SpO2 91% Patient is very pleasant and in mild to moderate distress. Extremely short of breath with just ambulating to the bathroom next to the exam room. Weight is up since his cath by about 10 pounds. Skin is warm and dry. Color is normal.  HEENT is unremarkable. Normocephalic/atraumatic. PERRL. Sclera are nonicteric. Neck is supple. No masses. No JVD. Lungs show decreased breath sounds. Heart tones are very distant. Abdomen is obese but soft. Extremities are with 1+ edema. Gait and ROM are intact. No gross neurologic deficits noted.  Wt Readings from Last 3 Encounters:  02/11/14 400 lb 6.4 oz (181.62 kg)  02/08/14 402 lb (182.346 kg)  01/31/14 390 lb (176.903 kg)    LABORATORY DATA/PROCEDURES:  Lab Results  Component Value  Date   WBC 11.2* 01/30/2014   HGB 11.4* 01/30/2014   HCT 34.8* 01/30/2014   PLT 271 01/30/2014   GLUCOSE 268* 02/08/2014   CHOL 143 08/31/2011   TRIG 212.0* 08/31/2011   HDL 37.30* 08/31/2011   LDLDIRECT 82.1 08/31/2011     LDLCALC 101* 11/06/2009   ALT 61* 11/27/2013   AST 61* 11/27/2013   NA 138 02/08/2014   K 4.3 02/08/2014   CL 101 02/08/2014   CREATININE 3.50* 02/08/2014   BUN 55* 02/08/2014   CO2 18* 02/08/2014   TSH 0.49 11/27/2013   PSA 0.68 10/01/2008   INR 1.7 02/08/2014   HGBA1C 10.1* 09/08/2012   MICROALBUR 6.24* 04/07/2009    BNP (last 3 results)  Recent Labs  11/27/13 1223  PROBNP 54.0     Brain Natriuretic Peptide 0.0 - 100.0 pg/mL  254.3 (H)     Cardiac Catheterization Procedure Note  Name: [name]  MRN: [id number]  DOB: 04/01/1951  Procedure: Right Heart Cath, Left Heart Cath, Selective Coronary Angiography, LV angiography  Indication: Shortness of Breath, Abnormal Myoview with lateral ischemia, moderate risk study  Procedural Details: The right wirst and antecubital fossa was prepped, draped, and anesthetized with 1% lidocaine. Using the modified Seldinger technique a 5 French sheath was placed in the right antecubital vein and a 5/6 French slender sheath was placed in the right radial artery. A Swan-Ganz catheter was used for the right heart catheterization. Standard protocol was followed for recording of right heart pressures and sampling of oxygen saturations. Fick cardiac output was calculated. Standard Judkins catheters were used for selective coronary angiography and left ventriculography. There were no immediate procedural complications. The patient was transferred to the post catheterization recovery area for further monitoring.  Procedural Findings:  Hemodynamics  RA 15  RV 57/18  PA 53/23 mean 36  PCWP 20  LV 134/23  AO 124/67 mean 91  Oxygen saturations:  PA 61  AO 91  Cardiac Output (Fick) 8.0  Cardiac Index (Fick) 2.9  Coronary angiography:    Coronary dominance: right  Left mainstem: The left main arises from the left coronary cusp. The vessel is patent and it divides into the left circumflex, LAD, and intermediate branch.  Left anterior descending (LAD): The LAD is patent to the apex. The vessel has diffuse stenosis in its midportion of approximately 40-50%. There is no high-grade disease. The diagonal branches are patent.  Left circumflex (LCx): The left circumflex is patent with a large obtuse marginal branch. The intermediate branch divides into twin vessels and it is also widely patent. The mid AV circumflex just beyond the OM branch has 50% stenosis. The PLA branch is patent.  Right coronary artery (RCA): The RCA is dominant. The stented segment in the proximal vessel is widely patent. The mid vessel has 30% stenosis his PDA and PLA branches are patent.  Left ventriculography: Left ventricular systolic function is vigorous, LVEF is estimated at 65%, there is no significant mitral regurgitation  Final Conclusions:  1. Elevated right and left heart filling pressures  2. Mild to moderate nonobstructive coronary artery disease of the LAD and left circumflex with a widely patent RCA  3. Normal LV systolic function  Total contrast administered = 75 cc  Michael Cooper  01/31/2014, 10:10 AM   Assessment / Plan: 1. Progressive DOE - most likely with progressive CKD/diastolic heart failure. He has not had his BMET drawn today. Now late in the day. Shortness of breath has gotten worse and he is very short of breath with minimal walking here in the office. Do not think we can get this turned around with oral Lasix. Discussed with Dr. Smith (DOD) who has seen him as well - agrees with admission to the hospital for IV diuresis and recheck of his   labs. Possible renal consult.   2. Recent cath - elevated right and left heart filling pressures - will need IV lasix.  3. Mild to moderate CAD per recent cath.  4. Morbid obesity  Will plan for  admission to [facility] - observation. IV Lasix. May need to consider renal consult. Will transfer by EMS  Patient is agreeable to this plan and will call if any problems develop in the interim.   [name] C. Kymere Fullington, RN, ANP-C [facility] [address] [place], [place]  [address] [phone]   

## 2014-02-12 ENCOUNTER — Observation Stay (HOSPITAL_COMMUNITY): Payer: PRIVATE HEALTH INSURANCE

## 2014-02-12 DIAGNOSIS — N183 Chronic kidney disease, stage 3 unspecified: Secondary | ICD-10-CM | POA: Diagnosis present

## 2014-02-12 DIAGNOSIS — R0602 Shortness of breath: Secondary | ICD-10-CM | POA: Diagnosis present

## 2014-02-12 DIAGNOSIS — I252 Old myocardial infarction: Secondary | ICD-10-CM | POA: Diagnosis not present

## 2014-02-12 DIAGNOSIS — Z7901 Long term (current) use of anticoagulants: Secondary | ICD-10-CM | POA: Diagnosis not present

## 2014-02-12 DIAGNOSIS — R931 Abnormal findings on diagnostic imaging of heart and coronary circulation: Secondary | ICD-10-CM | POA: Insufficient documentation

## 2014-02-12 DIAGNOSIS — Z9861 Coronary angioplasty status: Secondary | ICD-10-CM | POA: Diagnosis not present

## 2014-02-12 DIAGNOSIS — R0600 Dyspnea, unspecified: Secondary | ICD-10-CM | POA: Diagnosis present

## 2014-02-12 DIAGNOSIS — I4891 Unspecified atrial fibrillation: Secondary | ICD-10-CM | POA: Diagnosis present

## 2014-02-12 DIAGNOSIS — Z87891 Personal history of nicotine dependence: Secondary | ICD-10-CM | POA: Diagnosis not present

## 2014-02-12 DIAGNOSIS — I129 Hypertensive chronic kidney disease with stage 1 through stage 4 chronic kidney disease, or unspecified chronic kidney disease: Secondary | ICD-10-CM | POA: Diagnosis present

## 2014-02-12 DIAGNOSIS — E1129 Type 2 diabetes mellitus with other diabetic kidney complication: Secondary | ICD-10-CM | POA: Diagnosis present

## 2014-02-12 DIAGNOSIS — I509 Heart failure, unspecified: Secondary | ICD-10-CM | POA: Diagnosis present

## 2014-02-12 DIAGNOSIS — Z961 Presence of intraocular lens: Secondary | ICD-10-CM | POA: Diagnosis not present

## 2014-02-12 DIAGNOSIS — G4733 Obstructive sleep apnea (adult) (pediatric): Secondary | ICD-10-CM | POA: Diagnosis present

## 2014-02-12 DIAGNOSIS — I5033 Acute on chronic diastolic (congestive) heart failure: Secondary | ICD-10-CM | POA: Diagnosis present

## 2014-02-12 DIAGNOSIS — Z9849 Cataract extraction status, unspecified eye: Secondary | ICD-10-CM | POA: Diagnosis not present

## 2014-02-12 DIAGNOSIS — Z6841 Body Mass Index (BMI) 40.0 and over, adult: Secondary | ICD-10-CM | POA: Diagnosis not present

## 2014-02-12 DIAGNOSIS — M069 Rheumatoid arthritis, unspecified: Secondary | ICD-10-CM | POA: Diagnosis present

## 2014-02-12 DIAGNOSIS — Z794 Long term (current) use of insulin: Secondary | ICD-10-CM | POA: Diagnosis not present

## 2014-02-12 DIAGNOSIS — N058 Unspecified nephritic syndrome with other morphologic changes: Secondary | ICD-10-CM | POA: Diagnosis present

## 2014-02-12 DIAGNOSIS — E782 Mixed hyperlipidemia: Secondary | ICD-10-CM | POA: Diagnosis present

## 2014-02-12 DIAGNOSIS — I2510 Atherosclerotic heart disease of native coronary artery without angina pectoris: Secondary | ICD-10-CM | POA: Diagnosis present

## 2014-02-12 DIAGNOSIS — Z79899 Other long term (current) drug therapy: Secondary | ICD-10-CM | POA: Diagnosis not present

## 2014-02-12 LAB — GLUCOSE, CAPILLARY
GLUCOSE-CAPILLARY: 400 mg/dL — AB (ref 70–99)
GLUCOSE-CAPILLARY: 220 mg/dL — AB (ref 70–99)
GLUCOSE-CAPILLARY: 108 mg/dL — AB (ref 70–99)
GLUCOSE-CAPILLARY: 240 mg/dL — AB (ref 70–99)
Glucose-Capillary: 398 mg/dL — ABNORMAL HIGH (ref 70–99)

## 2014-02-12 LAB — BASIC METABOLIC PANEL
Anion gap: 15 (ref 5–15)
BUN: 33 mg/dL — ABNORMAL HIGH (ref 6–23)
CO2: 23 mEq/L (ref 19–32)
Calcium: 8.8 mg/dL (ref 8.4–10.5)
Chloride: 97 mEq/L (ref 96–112)
Creatinine, Ser: 1.81 mg/dL — ABNORMAL HIGH (ref 0.50–1.35)
GFR calc Af Amer: 45 mL/min — ABNORMAL LOW (ref >90)
GFR calc non Af Amer: 38 mL/min — ABNORMAL LOW (ref >90)
Glucose, Bld: 433 mg/dL — ABNORMAL HIGH (ref 70–99)
Potassium: 4.8 mEq/L (ref 3.7–5.3)
Sodium: 135 mEq/L — ABNORMAL LOW (ref 137–147)

## 2014-02-12 LAB — PROTIME-INR
INR: 1.78 — ABNORMAL HIGH (ref 0.00–1.49)
Prothrombin Time: 20.7 seconds — ABNORMAL HIGH (ref 11.6–15.2)

## 2014-02-12 MED ORDER — INSULIN ASPART 100 UNIT/ML ~~LOC~~ SOLN
90.0000 [IU] | Freq: Three times a day (TID) | SUBCUTANEOUS | Status: DC
  Administered 2014-02-12: 90 [IU] via SUBCUTANEOUS

## 2014-02-12 MED ORDER — AMLODIPINE BESYLATE 5 MG PO TABS
5.0000 mg | ORAL_TABLET | Freq: Every day | ORAL | Status: DC
  Administered 2014-02-12 – 2014-02-14 (×3): 5 mg via ORAL
  Filled 2014-02-12 (×3): qty 1

## 2014-02-12 MED ORDER — WARFARIN SODIUM 7.5 MG PO TABS
7.5000 mg | ORAL_TABLET | Freq: Once | ORAL | Status: AC
  Administered 2014-02-12: 7.5 mg via ORAL
  Filled 2014-02-12: qty 1

## 2014-02-12 MED ORDER — WARFARIN - PHARMACIST DOSING INPATIENT: Freq: Every day | Status: DC

## 2014-02-12 MED ORDER — FUROSEMIDE 10 MG/ML IJ SOLN
80.0000 mg | Freq: Two times a day (BID) | INTRAMUSCULAR | Status: DC
  Administered 2014-02-12 – 2014-02-13 (×3): 80 mg via INTRAVENOUS
  Filled 2014-02-12 (×3): qty 8

## 2014-02-12 MED ORDER — INSULIN GLARGINE 100 UNIT/ML ~~LOC~~ SOLN
100.0000 [IU] | Freq: Every day | SUBCUTANEOUS | Status: DC
Start: 2014-02-12 — End: 2014-02-13
  Administered 2014-02-12: 100 [IU] via SUBCUTANEOUS
  Filled 2014-02-12 (×2): qty 1

## 2014-02-12 MED ORDER — INSULIN ASPART 100 UNIT/ML ~~LOC~~ SOLN
0.0000 [IU] | Freq: Three times a day (TID) | SUBCUTANEOUS | Status: DC
  Administered 2014-02-12: 7 [IU] via SUBCUTANEOUS
  Administered 2014-02-13: 15 [IU] via SUBCUTANEOUS
  Administered 2014-02-13: 11 [IU] via SUBCUTANEOUS
  Administered 2014-02-13: 15 [IU] via SUBCUTANEOUS

## 2014-02-12 MED ORDER — NON FORMULARY: 50.0000 [IU] | Freq: Three times a day (TID) | Status: DC

## 2014-02-12 MED ORDER — INSULIN ASPART 100 UNIT/ML ~~LOC~~ SOLN
10.0000 [IU] | Freq: Three times a day (TID) | SUBCUTANEOUS | Status: DC
  Administered 2014-02-12 – 2014-02-13 (×2): 10 [IU] via SUBCUTANEOUS

## 2014-02-12 NOTE — Progress Notes (Signed)
ANTICOAGULATION CONSULT NOTE - Initial Consult  Pharmacy Consult for Coumadin Indication: atrial fibrillation  No Known Allergies  Patient Measurements: Height: 6' (182.9 cm) Weight: 395 lb 9.6 oz (179.443 kg) (scale b) IBW/kg (Calculated) : 77.6  Vital Signs: Temp: 97.5 F (36.4 C) (08/04 0517) Temp src: Oral (08/04 0517) BP: 156/64 mmHg (08/04 1038) Pulse Rate: 78 (08/04 1038)  Labs:  Recent Labs  02/11/14 1621 02/12/14 0529 02/12/14 1023  HGB 10.8*  --   --   HCT 32.9*  --   --   PLT 253  --   --   APTT 44*  --   --   LABPROT 21.2*  --  20.7*  INR 1.83*  --  1.78*  CREATININE 1.67* 1.81*  --     Estimated Creatinine Clearance: 70.8 ml/min (by C-G formula based on Cr of 1.81).   Medical History: Past Medical History  Diagnosis Date  . Diabetes mellitus   . Hypertension   . Rheumatoid arthritis(714.0)   . Neuropathy   . CHF (congestive heart failure)   . History of MI (myocardial infarction)     SILENT MI IN PAST   . CAD (coronary artery disease)   . Atrial fibrillation   . Anemia   . Myocardial infarction     pt stated he had a slient heart attack that showed up on nuclear stress.  . Sleep apnea      SEVERE, NO CPAP USED, DOES NOT KNOW  WHERE SLEEP STUDY DONE  . Sleep apnea     sleep study done last week   dr Tilman Neat  . Pilonidal cyst 01/25/2013  . Shortness of breath     with ambulation, with exertion  . Chronic kidney disease     CKD stage 3  . Neuropathy   . History of blood transfusion   . Carpal tunnel syndrome, bilateral     Medications:  Prescriptions prior to admission  Medication Sig Dispense Refill  . ACCU-CHEK FASTCLIX LANCETS MISC       . Ferrous Sulfate (IRON) 325 (65 FE) MG TABS Take 1 tablet by mouth daily.      . furosemide (LASIX) 40 MG tablet Take 40 mg by mouth daily.      [name] gabapentin (NEURONTIN) 300 MG capsule Take 1,200 mg by mouth daily.       . insulin regular human CONCENTRATED (HUMULIN R) 500 UNIT/ML SOLN injection  Inject 90 Units into the skin 3 (three) times daily with meals.      [name] lisinopril-hydrochlorothiazide (PRINZIDE,ZESTORETIC) 20-12.5 MG per tablet Take 1 tablet by mouth daily.      . Magnesium 400 MG TABS Take 400 mg by mouth daily.  30 tablet  11  . metoprolol tartrate (LOPRESSOR) 25 MG tablet Take 25 mg by mouth at bedtime.      . Multiple Vitamin (MULTIVITAMIN WITH MINERALS) TABS Take 1 tablet by mouth daily.      [name] oxycodone (ROXICODONE) 30 MG immediate release tablet Take 30 mg by mouth 4 (four) times daily.       . potassium chloride SA (KLOR-CON M20) 20 MEQ tablet Take 1 tablet (20 mEq total) by mouth daily.  30 tablet  4  . rosuvastatin (CRESTOR) 40 MG tablet Take 40 mg by mouth daily.      [name] spironolactone (ALDACTONE) 25 MG tablet Take 25 mg by mouth daily.      [name] warfarin (COUMADIN) 5 MG tablet Take 5-7.5 mg by mouth See admin instructions.  1 tablet daily except 1.5 tablets on Thursdays or as directed by coumadin clinic        Assessment: 63 y.o. male presents with progressive DOE. Pt on coumadin PTA for afib. INR 1.78 - slightly subtherapeutic. Noted dose missed yesterday as coumadin per pharmacy protocol was missed. Home dose: 5 mg daily except for 7.5mg  on Thur - last dose 8/2. Hgb low but stable, plt ok. No bleeding noted.  Goal of Therapy:  INR 2-3 Monitor platelets by anticoagulation protocol: Yes   Plan:  1. Daily INR 2. Coumadin 7.5mg  po today  Christoper Fabian, PharmD, BCPS Clinical pharmacist, pager [phone] 02/12/2014,11:39 AM

## 2014-02-12 NOTE — Progress Notes (Signed)
PT Cancellation Note  Patient Details Name: [name] MRN: [id number] DOB: 13-Feb-1951   Cancelled Treatment:    Reason Eval/Treat Not Completed: Patient at procedure or test/unavailable. Will continue to follow and check back later this afternoon, time permitting.    Ruthann Cancer 02/12/2014, 2:52 PM  Ruthann Cancer, PT, DPT Acute Rehabilitation Services Pager: [phone]

## 2014-02-12 NOTE — Progress Notes (Signed)
Subjective:  Still SOB  Objective:  Vital Signs in the last 24 hours: Temp:  [97.5 F (36.4 C)-98 F (36.7 C)] 97.5 F (36.4 C) (08/04 0517) Pulse Rate:  [68-104] 68 (08/04 0517) Resp:  [18-22] 18 (08/04 0517) BP: (145-160)/(45-66) 145/59 mmHg (08/04 0517) SpO2:  [91 %-100 %] 100 % (08/04 0517) Weight:  [395 lb 9.6 oz (179.443 kg)-400 lb 6.4 oz (181.62 kg)] 395 lb 9.6 oz (179.443 kg) (08/04 0517)  Intake/Output from previous day:  Intake/Output Summary (Last 24 hours) at 02/12/14 0841 Last data filed at 02/12/14 0300  Gross per 24 hour  Intake   1170 ml  Output   1275 ml  Net   -105 ml    Physical Exam: General appearance: alert, cooperative, no distress and morbidly obese Lungs: clear to auscultation bilaterally Heart: regular rate and rhythm   Rate: 70  Rhythm: normal sinus rhythm  Lab Results:  Recent Labs  02/11/14 1621  WBC 10.4  HGB 10.8*  PLT 253    Recent Labs  02/11/14 1621 02/12/14 0529  NA 140 135*  K 4.8 4.8  CL 100 97  CO2 26 23  GLUCOSE 283* 433*  BUN 35* 33*  CREATININE 1.67* 1.81*   No results found for this basename: TROPONINI, CK, MB,  in the last 72 hours  Recent Labs  02/11/14 1621  INR 1.83*    Imaging: Imaging results have been reviewed  Cardiac Studies:  Assessment/Plan:  63 year old male with PAF on Coumadin, morbid obesity, sleep apnea on C-pap, stage 3 CRI,and CAD. He has been previously cardioverted back in 2010. He has had PCI of the RCA in 2012 with DES. Last EF normal in June of 2015. Myoview from June of 2015 with partially reversible apical lateral defect. He was admitted for cath 01/31/14- moderate CAD. Post cath his SCR rose to 3.5. Diuretics were held and he presented 02/11/14 in CHF. He was admitted from the office for further evaluation.    Principal Problem:   Dyspnea Active Problems:   Diastolic heart failure   Acute on chronic renal insufficiency   Diabetes mellitus due to underlying condition with  diabetic nephropathy   Obstructive sleep apnea- on c-pap   CAD- RCA DES 2012, moderate CAD at cath 01/31/14   Abnormal nuclear cardiac imaging test   Chronic renal insufficiency, stage III (moderate)   HYPERLIPIDEMIA-MIXED   OBESITY-MORBID BMI 54   Essential hypertension, benign   Atrial fibrillation- DCCV 2010   Chronic anticoagulation- Coumadin    PLAN: No significant diuresis on 60 mg Lasix BID- consider increasing. He may need renal consult. No reason not to resume Coumadin- will order. Also resume C-pap HS. Consider Norvasc for HTN. Aldactone and Prinizide on hold.   Corine Shelter PA-C Beeper [phone] 02/12/2014, 8:41 AM   The patient was seen with Corine Shelter PAC. He is less dyspneic today.  However his diuresis has been modest.  His serum creatinine today is 1.81.  We will increase his furosemide to 80 mg IV twice a day. His blood sugar is poorly controlled.  Blood sugar is 433 today.  At home he is on much larger doses of insulin.  He uses Humulin R 3 times a day before meals.  We will start at 50 units 3 times a day.  At home he takes 100 units 3 times a day.  We will update his hemoglobin A1c. He feels that his breathing has improved significantly with nasal oxygen.  We  will check to see if he would qualify for home oxygen. We will start amlodipine for additional blood pressure control.

## 2014-02-12 NOTE — Progress Notes (Signed)
Co-signed for Irfat Habib RN/BSN for medication administration, assessments, Is&Os, Vital Signs, Progress Notes, Care Plans, and Patient education. Carsin Randazzo M RN/BSN 

## 2014-02-12 NOTE — Progress Notes (Signed)
Inpatient Diabetes Program Recommendations  AACE/ADA: New Consensus Statement on Inpatient Glycemic Control (2013)  Target Ranges:  Prepandial:   less than 140 mg/dL      Peak postprandial:   less than 180 mg/dL (1-2 hours)      Critically ill patients:  140 - 180 mg/dL   Results for [name], [name] (MRN [id number]) as of 02/12/2014 12:36  Ref. Range 02/11/2014 17:27 02/12/2014 05:56 02/12/2014 11:35  Glucose-Capillary Latest Range: 70-99 mg/dL 579 (H) 728 (H) 206 (H)   Diabetes history: DM2 Outpatient Diabetes medications: Humulin R U500 (concentrated insulin) 90 units (18 unit mark of U100 insulin syringe) TID with meals Current orders for Inpatient glycemic control: Novolog 90 units TID with meals  Inpatient Diabetes Program Recommendations Insulin - Basal: Please consider ordering Lantus 100 units QHS and adjust as more data is collected. Correction (SSI): Please order CBGs and Novolog resistant correction scale ACHS. Insulin - Meal Coverage: Please consider decreasing meal coverage to Novolog 10 units TID with meals.  Note: Spoke with patient about diabetes and home regimen for diabetes control. Patient reports that he is followed by Dr. [name] for diabetes management and currently he takes Humulin R U500 90 units TID with meals as an outpatient for diabetes control. Patient confirms that he draws up his U500 insulin with a U100 insulin syringe and draws up to the 18 unit mark which is equivalent to 90 units of U500. Patient reports that he has been on U500 insulin for about 5-6 months. Prior to changing to U500 patient states he was taking Lantus and Novolog insulin. According to the patient he checks his blood glucose several times per day and his glucose ranges from 60-350 mg/dl and he states that he rarely has any low blood glucose (less than once a month). Inquired about most recent A1C and patient reports that his last A1C was 8.6% on September 25, 2013.  Noted patient is ordered Novolog 90 units  TID with meals. As stated, patient takes Humulin R U500 as an outpatient which takes effect within 30 minutes of administration, has a peak similar to that observed with U-100 regular human insulin and has a relatively long duration of activity following a single dose (up to 24 hours) as compared with U-100 regular insulins.   Called Dr. Daune Perch office to confirm prescribed U500 dosages. Spoke with Dr. Daune Perch RN and she reports that patient last saw Dr. [name] in March 2015 and at that visit he was prescribed Humulin R U500 90 units (18 unit mark on U100 insulin syringe) TID with meals and Trulicity 0.75 mg weekly for diabetes control.  Prior to being placed on U500 in November 2014, patient was on Byetta 10 mcg BID with breakfast and supper, Lantus 100 units BID, and Novolog 24 units TID with meals.  Talked with patient and he reports that he stopped taking the Trulicity about 6 weeks ago due to how it was making him feel. Plan to discuss inpatient glycemic control with MD.    Thanks, Orlando Penner, RN, MSN, CCRN Diabetes Coordinator Inpatient Diabetes Program [phone] (Team Pager) [phone] (AP office) [phone] [facility] office)

## 2014-02-12 NOTE — H&P (Signed)
Patient was seen and examined with [name]. He is extremely dyspneic with activity. He is morbidly obese and has lower extremity edema. Plan is for admission and IV diuresis to relieve acute on chronic heart failure symptoms. The not anf plan are accurate as stated by Mrs. Tyrone Sage.

## 2014-02-12 NOTE — Progress Notes (Signed)
Report given to oncoming RN. Pt is resting in chair. No complaints. No signs and symptoms of distress.

## 2014-02-12 NOTE — Progress Notes (Signed)
UR completed 

## 2014-02-12 NOTE — Progress Notes (Signed)
Talked with patient regarding CPAP.  Patient states he is noncompliant with machine at home and does not wish to wear during his hospital stay.  Patient encouraged to call for Respiratory if he would like to wear CPAP.

## 2014-02-12 NOTE — Progress Notes (Signed)
CARDIAC REHAB PHASE I   PRE:  Rate/Rhythm: 77 SR  BP:  Supine:   Sitting: 147/58  Standing:    SaO2: 98 2L 89 RA  MODE:  Ambulation: 360 ft   POST:  Rate/Rhythm: 114  BP:  Supine:   Sitting: 156/113  Standing:    SaO2: 89 RA 96 2L 1440-1520 On arrival pt in recliner with O2 on 2L sat 98%. Discontinued pt's O2 sat on room air after he put on his shorts 89%. Pt walked in hall on room air. Monitored O2 sat throughout walk 89-91%. Pt is DOE and c/o of back pain and legs giving out with walking. He was able to walk 360 feet. Pt was very SOB at end of walk, but RA sat no lower than 89%. O2 replaced at 2L after walk and pt back to recliner. I questioned pt if he was wearing his CPAP at home and he states that he was not. He has the nasal mask and states that he has trouble breathing through his nose. He said that they tried other mask and had a hard time trying to make any of them work. BP elevated at end of walk. He also states that he sleeps in recliner all the time now and does not go to bed anymore.  Melina Copa RN 02/12/2014 3:17 PM

## 2014-02-13 ENCOUNTER — Encounter (HOSPITAL_COMMUNITY): Payer: Self-pay | Admitting: Cardiology

## 2014-02-13 LAB — BASIC METABOLIC PANEL
Anion gap: 16 — ABNORMAL HIGH (ref 5–15)
BUN: 33 mg/dL — ABNORMAL HIGH (ref 6–23)
CO2: 25 mEq/L (ref 19–32)
Calcium: 8.8 mg/dL (ref 8.4–10.5)
Chloride: 93 mEq/L — ABNORMAL LOW (ref 96–112)
Creatinine, Ser: 1.81 mg/dL — ABNORMAL HIGH (ref 0.50–1.35)
GFR calc Af Amer: 45 mL/min — ABNORMAL LOW (ref >90)
GFR calc non Af Amer: 38 mL/min — ABNORMAL LOW (ref >90)
Glucose, Bld: 332 mg/dL — ABNORMAL HIGH (ref 70–99)
Potassium: 4.6 mEq/L (ref 3.7–5.3)
Sodium: 134 mEq/L — ABNORMAL LOW (ref 137–147)

## 2014-02-13 LAB — GLUCOSE, CAPILLARY
GLUCOSE-CAPILLARY: 406 mg/dL — AB (ref 70–99)
GLUCOSE-CAPILLARY: 346 mg/dL — AB (ref 70–99)
Glucose-Capillary: 328 mg/dL — ABNORMAL HIGH (ref 70–99)
Glucose-Capillary: 299 mg/dL — ABNORMAL HIGH (ref 70–99)

## 2014-02-13 LAB — HEMOGLOBIN A1C
Hgb A1c MFr Bld: 9.6 % — ABNORMAL HIGH (ref <5.7)
Mean Plasma Glucose: 229 mg/dL — ABNORMAL HIGH (ref <117)

## 2014-02-13 LAB — PROTIME-INR
INR: 1.57 — ABNORMAL HIGH (ref 0.00–1.49)
PROTHROMBIN TIME: 18.8 s — AB (ref 11.6–15.2)

## 2014-02-13 MED ORDER — INSULIN REGULAR HUMAN (CONC) 500 UNIT/ML ~~LOC~~ SOLN
70.0000 [IU] | Freq: Three times a day (TID) | SUBCUTANEOUS | Status: DC
  Administered 2014-02-13: 70 [IU] via SUBCUTANEOUS
  Filled 2014-02-13 (×2): qty 20

## 2014-02-13 MED ORDER — INSULIN GLARGINE 100 UNIT/ML ~~LOC~~ SOLN
60.0000 [IU] | Freq: Two times a day (BID) | SUBCUTANEOUS | Status: DC
  Administered 2014-02-13: 60 [IU] via SUBCUTANEOUS
  Filled 2014-02-13 (×2): qty 0.6

## 2014-02-13 MED ORDER — FUROSEMIDE 80 MG PO TABS
80.0000 mg | ORAL_TABLET | Freq: Two times a day (BID) | ORAL | Status: DC
Start: 2014-02-13 — End: 2014-02-14
  Administered 2014-02-13 – 2014-02-14 (×2): 80 mg via ORAL
  Filled 2014-02-13 (×4): qty 1

## 2014-02-13 MED ORDER — INSULIN ASPART 100 UNIT/ML ~~LOC~~ SOLN
15.0000 [IU] | Freq: Three times a day (TID) | SUBCUTANEOUS | Status: DC
Start: 2014-02-13 — End: 2014-02-13

## 2014-02-13 MED ORDER — WARFARIN SODIUM 7.5 MG PO TABS
7.5000 mg | ORAL_TABLET | Freq: Once | ORAL | Status: AC
  Administered 2014-02-13: 7.5 mg via ORAL
  Filled 2014-02-13: qty 1

## 2014-02-13 NOTE — Progress Notes (Signed)
Inpatient Diabetes Program Recommendations  AACE/ADA: New Consensus Statement on Inpatient Glycemic Control (2013)  Target Ranges:  Prepandial:   less than 140 mg/dL      Peak postprandial:   less than 180 mg/dL (1-2 hours)      Critically ill patients:  140 - 180 mg/dL   Reason for Assessment: Results for [name], [name] (MRN [id number]) as of 02/13/2014 11:12  Ref. Range 02/12/2014 11:35 02/12/2014 14:25 02/12/2014 16:19 02/12/2014 22:03 02/13/2014 05:55  Glucose-Capillary Latest Range: 70-99 mg/dL 841 (H) 324 (H) 401 (H) 240 (H) 328 (H)   Diabetes history: Type 2 Diabetes Outpatient Diabetes medications: U500 insulin  Called and spoke with PA, [employer].  Orders received to increase Lantus to 60 units bid and increase Novolog meal coverage to 15 units tid with meals.    Will follow. Beryl Meager, RN, BC-ADM Inpatient Diabetes Coordinator Pager [phone]

## 2014-02-13 NOTE — Progress Notes (Addendum)
Pt. Informed that he is on fluid restriction and blood glucose elevated. Pt. Continues to request ginger ale and gram crackers. Pt. Educated. RN will continue to monitor. Pt. continues to request fluids and snacks.

## 2014-02-13 NOTE — Progress Notes (Signed)
    Subjective:  He feels like he is still more SOB than usual, but improving.  Objective:  Vital Signs in the last 24 hours: Temp:  [98.1 F (36.7 C)-98.4 F (36.9 C)] 98.4 F (36.9 C) (08/05 0525) Pulse Rate:  [71-85] 71 (08/05 0525) Resp:  [18-20] 20 (08/05 0525) BP: (128-163)/(49-64) 144/62 mmHg (08/05 0525) SpO2:  [93 %-98 %] 98 % (08/05 0525) Weight:  [394 lb 9.6 oz (178.989 kg)] 394 lb 9.6 oz (178.989 kg) (08/05 0525)  Intake/Output from previous day:  Intake/Output Summary (Last 24 hours) at 02/13/14 1023 Last data filed at 02/13/14 0900  Gross per 24 hour  Intake   1080 ml  Output   3201 ml  Net  -2121 ml    Physical Exam: General appearance: alert, cooperative, no distress and morbidly obese Lungs: clear to auscultation bilaterally Heart: regular rate and rhythm Extremities: trace edema   Rate: 78  Rhythm: normal sinus rhythm  Lab Results:  Recent Labs  02/11/14 1621  WBC 10.4  HGB 10.8*  PLT 253    Recent Labs  02/12/14 0529 02/13/14 0357  NA 135* 134*  K 4.8 4.6  CL 97 93*  CO2 23 25  GLUCOSE 433* 332*  BUN 33* 33*  CREATININE 1.81* 1.81*   No results found for this basename: TROPONINI, CK, MB,  in the last 72 hours  Recent Labs  02/13/14 0357  INR 1.57*    Imaging: Imaging results have been reviewed   Assessment/Plan:  63 year old male with PAF on Coumadin, morbid obesity, sleep apnea C-pap intol, stage 3 CRI, and CAD. He had been previously cardioverted back in 2010. He has had PCI of the RCA in 2012 with a DES. Last EF normal in June of 2015 with grade 2 diastolic dysfunction. Myoview from June of 2015  Showed a partially reversible apical lateral defect. He was admitted for cath 01/31/14- this revealed moderate CAD. Post cath his SCR rose to 3.5. Diuretics were held and he then presented 02/11/14 in CHF. He was admitted from the office for further evaluation. He has only diuresed 1.8KL and lost 4 lbs. His SCr has returned to  baseline.    Principal Problem:   Acute on chronic diastolic congestive heart failure Active Problems:   Dyspnea   Diabetes mellitus type 2, uncontrolled, with complications   Obstructive sleep apnea- unable to tolerate c-pap   CAD- RCA DES 2012, moderate CAD at cath 01/31/14   Chronic renal insufficiency, stage III (moderate)   HYPERLIPIDEMIA-MIXED   OBESITY-MORBID BMI 54   Essential hypertension, benign   Atrial fibrillation- DCCV 2010   Chronic anticoagulation- Coumadin   Acute on CRI after coronary angiogram 01/31/14 (SCr pk 3.5)    PLAN: Lasix dose increased to 80 mg IV BID on 8/4. Norvasc added for HTN. Insulin dose increased. He reports that his is not able to use C-pap. O2 sats on RA 89. He probably needs at least another 24 hrs of diuresis.   Corine Shelter PA-C Beeper [phone] 02/13/2014, 10:23 AM  Agree with above.  The patient has had a modest diuresis.  His breathing is significantly improved.  Oxygen levels are normal on room air and he will not need home oxygen.  Physical therapy suggests home physical therapy after discharge to help with ambulation and also  To help with his right shoulder pain. We will switch to oral Lasix starting this evening and anticipate probable discharge home tomorrow.

## 2014-02-13 NOTE — Progress Notes (Signed)
Pt declined walking. Sts he walked once today and that seems like enough. His back is hurting. Discussed low sodium, daily wts, med compliance, HF booklet and walking more. Encouraged pt to work on his weight so therefore less stress on his back. Voiced understanding.  [phone] Ethelda Chick CES, ACSM 3:36 PM 02/13/2014

## 2014-02-13 NOTE — Evaluation (Signed)
Physical Therapy Evaluation Patient Details Name: [name] MRN: [id number] DOB: [birth year] Today's Date: 02/13/2014   History of Present Illness  Pt with h/o CAD, AF, DM. Presents tp physicians office with increasing DOE, difficulty ambulating, sleepy, wt gain. Felt to be volume overloaded, admitted to hospital for mgmt.  Clinical Impression  Pt presents with increased DOE which is already improving at this point. Pt ambulated 400' with no AD, mod I, VSS. Pt limited in activity by chronic LBP from stenosis. He is also having right shoulder pain and limited ROM due to pain. Recommend oupt PT for further mgmt of these issues. No further acut PT needs at this time. Pt encouraged to ambulate 2-3x in hallway today.    Follow Up Recommendations Outpatient PT    Equipment Recommendations  None recommended by PT    Recommendations for Other Services       Precautions / Restrictions Precautions Precautions: None Restrictions Weight Bearing Restrictions: No      Mobility  Bed Mobility               General bed mobility comments: pt received in recliner, reports independence with bed mobility  Transfers Overall transfer level: Modified independent Equipment used: None                Ambulation/Gait Ambulation/Gait assistance: Modified independent (Device/Increase time) Ambulation Distance (Feet): 400 Feet Assistive device: None Gait Pattern/deviations: Step-through pattern;Wide base of support Gait velocity: WFL Gait velocity interpretation: at or above normal speed for age/gender General Gait Details: pt ambulates with wide BOD due to body mass. Needed one standing rest break halfway through ambulation. Experienced back pain from stenosis by end of ambulation and reports that this limits his activities at home. No LOB, pace WFL.   Stairs            Wheelchair Mobility    Modified Rankin (Stroke Patients Only)       Balance Overall balance  assessment: Modified Independent                                           Pertinent Vitals/Pain O2 sats 94% on RW with ambulation, HR 105 bpm Back pain, faces 4/10 after ambulation, rest given    Home Living Family/patient expects to be discharged to:: Private residence Living Arrangements: Other relatives Available Help at Discharge: Family;Available PRN/intermittently Type of Home: House Home Access: Stairs to enter   [employer] of Steps: 3 Home Layout: One level Home Equipment: None Additional Comments: pt lives with his sister, they keep an eye on each other. Drives, does his own shopping and self care    Prior Function Level of Independence: Independent               Hand Dominance        Extremity/Trunk Assessment   Upper Extremity Assessment: Overall WFL for tasks assessed           Lower Extremity Assessment: Overall WFL for tasks assessed      Cervical / Trunk Assessment: Other exceptions  Communication   Communication: No difficulties  Cognition Arousal/Alertness: Awake/alert Behavior During Therapy: WFL for tasks assessed/performed Overall Cognitive Status: Within Functional Limits for tasks assessed                      General Comments General comments (skin integrity, edema, etc.): pt  reports right shoulder pain, supposed to have MRI on 02/16/14.     Exercises Other Exercises Other Exercises: discussed activities and compensations for stenosis.  Other Exercises: pt contemplating going to gym with pool, encouraged water walking for wt loss and back pain mgmt      Assessment/Plan    PT Assessment All further PT needs can be met in the next venue of care  PT Diagnosis Difficulty walking;Acute pain   PT Problem List Pain;Decreased mobility;Decreased activity tolerance  PT Treatment Interventions     PT Goals (Current goals can be found in the Care Plan section) Acute Rehab PT Goals Patient Stated  Goal: be able to exercise for wt loss PT Goal Formulation: No goals set, d/c therapy    Frequency     Barriers to discharge        Co-evaluation               End of Session   Activity Tolerance: Patient tolerated treatment well Patient left: in chair;with call bell/phone within reach Nurse Communication: Mobility status         Time: 5300-5110 PT Time Calculation (min): 16 min   Charges:   PT Evaluation $Initial PT Evaluation Tier I: 1 Procedure PT Treatments $Gait Training: 8-22 mins   PT G Codes:        [name], PT  Acute Rehab Services  [place], [place] 02/13/2014, 11:19 AM

## 2014-02-13 NOTE — Progress Notes (Signed)
Report given to receiving RN. Patient sitting in recliner watching TV. No verbal complaints and no signs of distress or discomfort.

## 2014-02-13 NOTE — Progress Notes (Signed)
The patient was threatening to use his Humulin on his own.  I spoke to the diabetes coordinator and we will use his insulin at a decreased dose of 70u TID with meals and SS for correction.  DC lantus and TID novolog.  His last CBG was 406.  The RN will collect his supply for use.   Gwendy Boeder,  PAC

## 2014-02-13 NOTE — Progress Notes (Signed)
Pt refusing to wear CPAP. RT will continue to monitor. PT non-compliant at home

## 2014-02-13 NOTE — Progress Notes (Signed)
CRITICAL VALUE ALERT  Critical value received:  CBG=406  Date of notification:  02/13/14  Time of notification:  1638  Critical value read back:yes  Nurse who received alert:  Junie Panning RN  MD notified (1st page):  Judie Grieve PA  Time of first page:  1643  MD notified (2nd page):  Time of second page:  Responding MD:  Judie Grieve PA  Time MD responded:  1643  New orders received.

## 2014-02-13 NOTE — Progress Notes (Signed)
ANTICOAGULATION CONSULT NOTE - Follow Up Consult  Pharmacy Consult for Coumadin Indication: atrial fibrillation  No Known Allergies  Patient Measurements: Height: 6' (182.9 cm) Weight: 394 lb 9.6 oz (178.989 kg) (scale b) IBW/kg (Calculated) : 77.6  Vital Signs: Temp: 98.4 F (36.9 C) (08/05 0525) Temp src: Oral (08/05 0525) BP: 144/62 mmHg (08/05 0525) Pulse Rate: 71 (08/05 0525)  Labs:  Recent Labs  02/11/14 1621 02/12/14 0529 02/12/14 1023 02/13/14 0357  HGB 10.8*  --   --   --   HCT 32.9*  --   --   --   PLT 253  --   --   --   APTT 44*  --   --   --   LABPROT 21.2*  --  20.7* 18.8*  INR 1.83*  --  1.78* 1.57*  CREATININE 1.67* 1.81*  --  1.81*    Estimated Creatinine Clearance: 70.7 ml/min (by C-G formula based on Cr of 1.81).   Medical History: Past Medical History  Diagnosis Date  . Diabetes mellitus   . Hypertension   . Rheumatoid arthritis(714.0)   . Diastolic congestive heart failure   . History of MI (myocardial infarction)     SILENT MI IN PAST   . CAD (coronary artery disease)   . Atrial fibrillation   . Anemia   . Myocardial infarction     pt stated he had a slient heart attack that showed up on nuclear stress.  . Sleep apnea     C-pap intolerant  . Pilonidal cyst 01/25/2013  . Shortness of breath     with ambulation, with exertion  . Chronic kidney disease     CKD stage 3  . Neuropathy   . History of blood transfusion   . Carpal tunnel syndrome, bilateral   . Morbid obesity     Medications:  Prescriptions prior to admission  Medication Sig Dispense Refill  . ACCU-CHEK FASTCLIX LANCETS MISC       . Ferrous Sulfate (IRON) 325 (65 FE) MG TABS Take 1 tablet by mouth daily.      . furosemide (LASIX) 40 MG tablet Take 40 mg by mouth daily.      [name] gabapentin (NEURONTIN) 300 MG capsule Take 1,200 mg by mouth daily.       . insulin regular human CONCENTRATED (HUMULIN R) 500 UNIT/ML SOLN injection Inject 90 Units into the skin 3 (three)  times daily with meals.      [name] lisinopril-hydrochlorothiazide (PRINZIDE,ZESTORETIC) 20-12.5 MG per tablet Take 1 tablet by mouth daily.      . Magnesium 400 MG TABS Take 400 mg by mouth daily.  30 tablet  11  . metoprolol tartrate (LOPRESSOR) 25 MG tablet Take 25 mg by mouth at bedtime.      . Multiple Vitamin (MULTIVITAMIN WITH MINERALS) TABS Take 1 tablet by mouth daily.      [name] oxycodone (ROXICODONE) 30 MG immediate release tablet Take 30 mg by mouth 4 (four) times daily.       . potassium chloride SA (KLOR-CON M20) 20 MEQ tablet Take 1 tablet (20 mEq total) by mouth daily.  30 tablet  4  . rosuvastatin (CRESTOR) 40 MG tablet Take 40 mg by mouth daily.      [name] spironolactone (ALDACTONE) 25 MG tablet Take 25 mg by mouth daily.      [name] warfarin (COUMADIN) 5 MG tablet Take 5-7.5 mg by mouth See admin instructions. 1 tablet daily except 1.5 tablets on Thursdays or  as directed by coumadin clinic        Assessment: 63 y.o. male presents with progressive DOE. Pt on coumadin PTA for afib. INR remains sub-therapeutic at 1.57. Noted dose missed on 8/3 as coumadin per pharmacy protocol was missed. Home dose: 5 mg daily except for 7.5mg  on Thur - last dose 8/2. Hgb low but stable, plt ok. No bleeding noted.  Goal of Therapy:  INR 2-3 Monitor platelets by anticoagulation protocol: Yes   Plan:  1. Daily INR 2. Repeat Coumadin 7.5mg  po today  Vinnie Level, PharmD.  Clinical Pharmacist Pager [phone]

## 2014-02-14 ENCOUNTER — Other Ambulatory Visit: Payer: Self-pay | Admitting: Physician Assistant

## 2014-02-14 DIAGNOSIS — R791 Abnormal coagulation profile: Secondary | ICD-10-CM

## 2014-02-14 DIAGNOSIS — N1831 Chronic kidney disease, stage 3a: Secondary | ICD-10-CM

## 2014-02-14 DIAGNOSIS — N183 Chronic kidney disease, stage 3 (moderate): Principal | ICD-10-CM

## 2014-02-14 LAB — BASIC METABOLIC PANEL
Anion gap: 16 — ABNORMAL HIGH (ref 5–15)
BUN: 33 mg/dL — ABNORMAL HIGH (ref 6–23)
CO2: 28 mEq/L (ref 19–32)
Calcium: 8.9 mg/dL (ref 8.4–10.5)
Chloride: 94 mEq/L — ABNORMAL LOW (ref 96–112)
Creatinine, Ser: 1.64 mg/dL — ABNORMAL HIGH (ref 0.50–1.35)
GFR calc Af Amer: 50 mL/min — ABNORMAL LOW (ref >90)
GFR calc non Af Amer: 43 mL/min — ABNORMAL LOW (ref >90)
Glucose, Bld: 119 mg/dL — ABNORMAL HIGH (ref 70–99)
Potassium: 3.7 mEq/L (ref 3.7–5.3)
Sodium: 138 mEq/L (ref 137–147)

## 2014-02-14 LAB — GLUCOSE, CAPILLARY
GLUCOSE-CAPILLARY: 225 mg/dL — AB (ref 70–99)
Glucose-Capillary: 106 mg/dL — ABNORMAL HIGH (ref 70–99)

## 2014-02-14 LAB — PROTIME-INR
INR: 1.77 — AB (ref 0.00–1.49)
PROTHROMBIN TIME: 20.6 s — AB (ref 11.6–15.2)

## 2014-02-14 MED ORDER — AMLODIPINE BESYLATE 5 MG PO TABS: 5.0000 mg | ORAL_TABLET | Freq: Every day | ORAL | Status: DC

## 2014-02-14 MED ORDER — FUROSEMIDE 80 MG PO TABS: 80.0000 mg | ORAL_TABLET | Freq: Two times a day (BID) | ORAL | Status: DC

## 2014-02-14 MED ORDER — METOPROLOL TARTRATE 25 MG PO TABS: 12.5000 mg | ORAL_TABLET | Freq: Two times a day (BID) | ORAL | Status: DC

## 2014-02-14 MED ORDER — WARFARIN SODIUM 7.5 MG PO TABS
7.5000 mg | ORAL_TABLET | Freq: Once | ORAL | Status: DC
  Filled 2014-02-14: qty 1

## 2014-02-14 NOTE — Progress Notes (Signed)
ANTICOAGULATION CONSULT NOTE - Follow Up Consult  Pharmacy Consult for Coumadin Indication: atrial fibrillation  No Known Allergies  Patient Measurements: Height: 6' (182.9 cm) Weight: 393 lb (178.264 kg) (scale B) IBW/kg (Calculated) : 77.6  Vital Signs: Temp: 97.9 F (36.6 C) (08/06 0448) Temp src: Oral (08/06 0448) BP: 147/61 mmHg (08/06 1019) Pulse Rate: 76 (08/06 1019)  Labs:  Recent Labs  02/11/14 1621 02/12/14 0529 02/12/14 1023 02/13/14 0357 02/14/14 0454  HGB 10.8*  --   --   --   --   HCT 32.9*  --   --   --   --   PLT 253  --   --   --   --   APTT 44*  --   --   --   --   LABPROT 21.2*  --  20.7* 18.8* 20.6*  INR 1.83*  --  1.78* 1.57* 1.77*  CREATININE 1.67* 1.81*  --  1.81* 1.64*    Estimated Creatinine Clearance: 77.9 ml/min (by C-G formula based on Cr of 1.64).   Medical History: Past Medical History  Diagnosis Date  . Diabetes mellitus   . Hypertension   . Rheumatoid arthritis(714.0)   . Diastolic congestive heart failure   . History of MI (myocardial infarction)     SILENT MI IN PAST   . CAD (coronary artery disease)   . Atrial fibrillation   . Anemia   . Myocardial infarction     pt stated he had a slient heart attack that showed up on nuclear stress.  . Sleep apnea     C-pap intolerant  . Pilonidal cyst 01/25/2013  . Shortness of breath     with ambulation, with exertion  . Chronic kidney disease     CKD stage 3  . Neuropathy   . History of blood transfusion   . Carpal tunnel syndrome, bilateral   . Morbid obesity     Medications:  Prescriptions prior to admission  Medication Sig Dispense Refill  . ACCU-CHEK FASTCLIX LANCETS MISC       . Ferrous Sulfate (IRON) 325 (65 FE) MG TABS Take 1 tablet by mouth daily.      . furosemide (LASIX) 40 MG tablet Take 40 mg by mouth daily.      [name] gabapentin (NEURONTIN) 300 MG capsule Take 1,200 mg by mouth daily.       . insulin regular human CONCENTRATED (HUMULIN R) 500 UNIT/ML SOLN  injection Inject 90 Units into the skin 3 (three) times daily with meals.      [name] lisinopril-hydrochlorothiazide (PRINZIDE,ZESTORETIC) 20-12.5 MG per tablet Take 1 tablet by mouth daily.      . Magnesium 400 MG TABS Take 400 mg by mouth daily.  30 tablet  11  . metoprolol tartrate (LOPRESSOR) 25 MG tablet Take 25 mg by mouth at bedtime.      . Multiple Vitamin (MULTIVITAMIN WITH MINERALS) TABS Take 1 tablet by mouth daily.      [name] oxycodone (ROXICODONE) 30 MG immediate release tablet Take 30 mg by mouth 4 (four) times daily.       . potassium chloride SA (KLOR-CON M20) 20 MEQ tablet Take 1 tablet (20 mEq total) by mouth daily.  30 tablet  4  . rosuvastatin (CRESTOR) 40 MG tablet Take 40 mg by mouth daily.      [name] spironolactone (ALDACTONE) 25 MG tablet Take 25 mg by mouth daily.      [name] warfarin (COUMADIN) 5 MG tablet Take 5-7.5  mg by mouth See admin instructions. 1 tablet daily except 1.5 tablets on Thursdays or as directed by coumadin clinic        Assessment: 63 y.o. male presents with progressive DOE. Pt on coumadin PTA for afib. INR remains sub-therapeutic at 1.77. Noted dose missed on 8/3 as coumadin per pharmacy protocol was missed. Home dose: 5 mg daily except for 7.5mg  on Thur - last dose 8/2. Hgb low but stable, plt ok. No bleeding noted.  Goal of Therapy:  INR 2-3 Monitor platelets by anticoagulation protocol: Yes   Plan:  1. Daily INR 2. Repeat Coumadin 7.5mg  po today  Talbert Cage, PharmD.  Clinical Pharmacist Pager [phone]

## 2014-02-14 NOTE — Progress Notes (Signed)
Patient Name: [name] Date of Encounter: 02/14/2014     Principal Problem:   Acute on chronic diastolic congestive heart failure Active Problems:   Diabetes mellitus type 2, uncontrolled, with complications   HYPERLIPIDEMIA-MIXED   OBESITY-MORBID BMI 54   Essential hypertension, benign   Atrial fibrillation- DCCV 2010   Obstructive sleep apnea- unable to tolerate c-pap   CAD- RCA DES 2012, moderate CAD at cath 01/31/14   Dyspnea   Chronic anticoagulation- Coumadin   Chronic renal insufficiency, stage III (moderate)   Acute on CRI after coronary angiogram 01/31/14 (SCr pk 3.5)    SUBJECTIVE  The patient is feeling well today.  Oxygenation remains normal on room air.  No chest pain or increased dyspnea.  Rhythm is stable normal sinus rhythm. The patient has had widely varying blood sugars here in the hospital.  When discharged he will return to his previous outpatient regimen and followup with Dr. [name].  CURRENT MEDS . amLODipine  5 mg Oral Daily  . atorvastatin  20 mg Oral q1800  . ferrous sulfate  325 mg Oral Q breakfast  . furosemide  80 mg Oral BID  . gabapentin  400 mg Oral TID  . insulin aspart  0-20 Units Subcutaneous TID WC & HS  . insulin regular human CONCENTRATED  70 Units Subcutaneous TID WC  . metoprolol tartrate  12.5 mg Oral BID  . sodium chloride  3 mL Intravenous Q12H  . Warfarin - Pharmacist Dosing Inpatient   Does not apply q1800    OBJECTIVE  Filed Vitals:   02/13/14 1652 02/13/14 2100 02/13/14 2332 02/14/14 0448  BP: 152/57 132/43 131/54 130/49  Pulse: 81 74 79 71  Temp:  97.5 F (36.4 C)  97.9 F (36.6 C)  TempSrc:  Oral  Oral  Resp:  16  20  Height:      Weight:    393 lb (178.264 kg)  SpO2:  98%  96%    Intake/Output Summary (Last 24 hours) at 02/14/14 0856 Last data filed at 02/14/14 0341  Gross per 24 hour  Intake   1900 ml  Output   3825 ml  Net  -1925 ml   Filed Weights   02/12/14 0517 02/13/14 0525 02/14/14 0448    Weight: 395 lb 9.6 oz (179.443 kg) 394 lb 9.6 oz (178.989 kg) 393 lb (178.264 kg)    PHYSICAL EXAM  General: Pleasant, NAD. Neuro: Alert and oriented X 3. Moves all extremities spontaneously. Psych: Normal affect. HEENT:  Normal  Neck: Supple without bruits or JVD. Lungs:  Resp regular and unlabored, CTA. Heart: RRR no s3, s4, or murmurs. Abdomen: Soft, non-tender, non-distended, BS + x 4.  Extremities: No clubbing, cyanosis.  There is mild edema. DP/PT/Radials 2+ and equal bilaterally.  Accessory Clinical Findings  CBC  Recent Labs  02/11/14 1621  WBC 10.4  NEUTROABS 7.6  HGB 10.8*  HCT 32.9*  MCV 84.1  PLT 253   Basic Metabolic Panel  Recent Labs  02/11/14 1621  02/13/14 0357 02/14/14 0454  NA 140  < > 134* 138  K 4.8  < > 4.6 3.7  CL 100  < > 93* 94*  CO2 26  < > 25 28  GLUCOSE 283*  < > 332* 119*  BUN 35*  < > 33* 33*  CREATININE 1.67*  < > 1.81* 1.64*  CALCIUM 8.8  < > 8.8 8.9  MG 1.7  --   --   --   < > =  values in this interval not displayed. Liver Function Tests  Recent Labs  02/11/14 1621  AST 38*  ALT 46  ALKPHOS 63  BILITOT 0.5  PROT 6.8  ALBUMIN 3.1*   No results found for this basename: LIPASE, AMYLASE,  in the last 72 hours Cardiac Enzymes No results found for this basename: CKTOTAL, CKMB, CKMBINDEX, TROPONINI,  in the last 72 hours BNP No components found with this basename: POCBNP,  D-Dimer No results found for this basename: DDIMER,  in the last 72 hours Hemoglobin A1C  Recent Labs  02/13/14 0357  HGBA1C 9.6*   Fasting Lipid Panel No results found for this basename: CHOL, HDL, LDLCALC, TRIG, CHOLHDL, LDLDIRECT,  in the last 72 hours Thyroid Function Tests  Recent Labs  02/11/14 1621  TSH 0.408    TELE  Normal sinus rhythm  ECG    Radiology/Studies  Dg Chest 2 View  02/12/2014   CLINICAL DATA:  Difficulty breathing  EXAM: CHEST  2 VIEW  COMPARISON:  July 20, 2013  FINDINGS: There is no edema or  consolidation. Relative flattening hemidiaphragms may indicate some underlying emphysematous change. Mild generalized interstitial prominence probably reflects chronic inflammatory type change, stable. Heart is borderline enlarged with pulmonary vascularity within normal limits. No adenopathy. There is atherosclerotic change in the aorta. No bone lesions.  IMPRESSION: Evidence of a degree of underlying emphysematous change. No edema or consolidation. Heart prominent but stable.   Electronically Signed   By: Bretta Bang M.D.   On: 02/12/2014 08:02    ASSESSMENT AND PLAN 1. acute on chronic diastolic heart failure 2. 2. Mild to moderate nonobstructive coronary artery disease of the LAD and left circumflex with a widely patent RCA  3. insulin-dependent diabetes mellitus 4. chronic kidney disease stage III 5. morbid obesity 6. prior history of atrial fibrillation, on long-term Coumadin, presently in normal sinus rhythm.  Plan okay for discharge today.  Will need close followup of his prothrombin times and a transition of care visit in one week.  We should also check another BMET at the time of his office visit.  On discharge he will resume his home dose of U-500 insulin.  If he runs out of his supply he will switch to an equivalent dose of Humulin R. until he can get a refill of the U 500 insulin.  He will followup with Dr. [name] regarding his diabetes.  We will send him home on a higher dose of Lasix 80 mg twice a day.  His renal function is stable.  He is not being sent home on ACE inhibitor or spironolactone at this time.   Signed, Cassell Clement MD

## 2014-02-14 NOTE — Discharge Summary (Signed)
Physician Discharge Summary     Patient ID: [name] MRN: [id number] DOB/AGE: 1950-11-14 63 y.o.  Admit date: 02/11/2014 Discharge date: 02/14/2014  Admission Diagnoses:  Acute on chronic diastolic congestive heart failure  Discharge Diagnoses:  Principal Problem:   Acute on chronic diastolic congestive heart failure Active Problems:   Diabetes mellitus type 2, uncontrolled, with complications   HYPERLIPIDEMIA-MIXED   OBESITY-MORBID BMI 54   Essential hypertension, benign   Atrial fibrillation- DCCV 2010   Obstructive sleep apnea- unable to tolerate c-pap   CAD- RCA DES 2012, moderate CAD at cath 01/31/14   Dyspnea   Chronic anticoagulation- Coumadin   Chronic renal insufficiency, stage III (moderate)   Acute on CRI after coronary angiogram 01/31/14 (SCr pk 3.5)   Discharged Condition: stable  Hospital Course:   [name] is seen back by [name] for a post cath visit. Seen for Dr. [name]. He is a 63 year old male with AF and CAD. He has been previously cardioverted back in 2010. He has had PCI of the RCA in 2012 with DES. Last EF normal in June of 2015. Small oscillating density on the aortic valve could not be ruled out.  Myoview from June of 2015 with partially reversible apical lateral defect. Other issues include CKD, morbid obesity, deconditioning and OSA.   Seen last month by Dr. [name] with worsening dyspnea. Was cathed on 7/23. Elevated pressures with mild to moderate disease noted. 75 cc contrast given. Has had worsening kidney function since then - up to 3.5 as of Friday. Has had his lasix, ACE and aldactone held since last Friday - Last doses were on Thursday.   The patient presented more short of breath. Hard to move around. Had lab this morning - no BMET and only a BNP noted. More swollen in a generalized fashion. Weight up. No chest pain. Could hardly make it up to the office. Sleeping off and on.  No chest pain.    The patient was transferred from the  office to [facility] for IV diuresis.  Initially it was 60mg  BID then increased to 80 BID.  This resulted in net fluid loss of 4.0L.  He was transitioned back to oral lasix 80mg  BID.  He uses Humulin insulin at home and was started on this here using his home supply at a reduced dose.  A1C is 9.6.  He refused to wear the CPAP.  No ACE-I or Aldactone at this time.  Followup with Dr. regarding his diabetes. Close followup of his prothrombin times and a transition of care visit in one week with BMET.  The patient was seen by Dr. who felt he was stable for DC home.    Consults: Diabetes Coordinator, Cardiac Rehab  Significant Diagnostic Studies:  CHEST 2 VIEW  COMPARISON: July 20, 2013  FINDINGS:  There is no edema or consolidation. Relative flattening  hemidiaphragms may indicate some underlying emphysematous change.  Mild generalized interstitial prominence probably reflects chronic  inflammatory type change, stable. Heart is borderline enlarged with  pulmonary vascularity within normal limits. No adenopathy. There is  atherosclerotic change in the aorta. No bone lesions.  IMPRESSION:  Evidence of a degree of underlying emphysematous change. No edema or  consolidation. Heart prominent but stable.   BMET    Component Value Date/Time   NA 138 02/14/2014 0454   K 3.7 02/14/2014 0454   CL 94* 02/14/2014 0454   CO2 28 02/14/2014 0454   GLUCOSE 119* 02/14/2014 0454  BUN 33* 02/14/2014 0454   CREATININE 1.64* 02/14/2014 0454   CREATININE 3.50* 02/08/2014 0857   CALCIUM 8.9 02/14/2014 0454   GFRNONAA 43* 02/14/2014 0454   GFRNONAA 19* 02/04/2014 1022   GFRAA 50* 02/14/2014 0454   GFRAA 22* 02/04/2014 1022      Treatments: See above  Discharge Exam: Blood pressure 147/61, pulse 76, temperature 97.9 F (36.6 C), temperature source Oral, resp. rate 20, height 6' (1.829 m), weight 393 lb (178.264 kg), SpO2 96.00%.   Disposition: 01-Home or Self Care      Discharge Instructions   Diet  - low sodium heart healthy    Complete by:  As directed      Discharge instructions    Complete by:  As directed   Monitor weight daily.  If you gain 3 pounds in 24 hours, or 5 pounds in a week, call the office for instructions.     Increase activity slowly    Complete by:  As directed             Medication List    STOP taking these medications       lisinopril-hydrochlorothiazide 20-12.5 MG per tablet  Commonly known as:  PRINZIDE,ZESTORETIC     spironolactone 25 MG tablet  Commonly known as:  ALDACTONE      TAKE these medications       ACCU-CHEK FASTCLIX LANCETS Misc     amLODipine 5 MG tablet  Commonly known as:  NORVASC  Take 1 tablet (5 mg total) by mouth daily.     furosemide 80 MG tablet  Commonly known as:  LASIX  Take 1 tablet (80 mg total) by mouth 2 (two) times daily.     gabapentin 300 MG capsule  Commonly known as:  NEURONTIN  Take 1,200 mg by mouth daily.     HUMULIN R 500 UNIT/ML Soln injection  Generic drug:  insulin regular human CONCENTRATED  Inject 90 Units into the skin 3 (three) times daily with meals.     Iron 325 (65 FE) MG Tabs  Take 1 tablet by mouth daily.     Magnesium 400 MG Tabs  Take 400 mg by mouth daily.     metoprolol tartrate 25 MG tablet  Commonly known as:  LOPRESSOR  Take 0.5 tablets (12.5 mg total) by mouth 2 (two) times daily.     multivitamin with minerals Tabs tablet  Take 1 tablet by mouth daily.     oxycodone 30 MG immediate release tablet  Commonly known as:  ROXICODONE  Take 30 mg by mouth 4 (four) times daily.     potassium chloride SA 20 MEQ tablet  Commonly known as:  KLOR-CON M20  Take 1 tablet (20 mEq total) by mouth daily.     rosuvastatin 40 MG tablet  Commonly known as:  CRESTOR  Take 40 mg by mouth daily.     warfarin 5 MG tablet  Commonly known as:  COUMADIN  Take 5-7.5 mg by mouth See admin instructions. 1 tablet daily except 1.5 tablets on Thursdays or as directed by coumadin clinic        Follow-up Information   Follow up with Tereso Newcomer, PA-C On 03/04/2014. (12:10 PM)    Specialty:  Physician Assistant   Contact information:   [address] [place] [address] [phone]       Follow up with Smart, Gaspar Skeeters, [facility] On 02/18/2014. (1:10PM)    Specialty:  Pharmacist   Contact information:  [address] [place] [address] [phone]      Greater than 30 minutes was spent completing the patient's discharge.    SignedWilburt Finlay: Lehman Whiteley, PA-C 02/14/2014, 11:31 AM

## 2014-02-14 NOTE — Progress Notes (Signed)
Patient self administered own insulin. Patient is being discharged today. Scheduled dose of sliding scale insulin was held. MD notified and made aware. No new orders.

## 2014-02-14 NOTE — Progress Notes (Signed)
Co-signed for Irfat Habib RN/BSN for medication administration, assessments, Is&Os, Vital Signs, Progress Notes, Care Plans, and Patient education. Hernando Reali M RN/BSN 

## 2014-02-14 NOTE — Progress Notes (Signed)
Pt DC'd to home with sister via wheelchair. No O2, DC tele, DC IV. DC instructions given, no signs and symptoms of distress, no complaints.

## 2014-02-14 NOTE — Discharge Instructions (Signed)
On Monday you need to get a BMET drawn in the lab when you go to check your INR in the coumadin clinic.

## 2014-02-16 ENCOUNTER — Inpatient Hospital Stay: Admission: RE | Admit: 2014-02-16 | Payer: PRIVATE HEALTH INSURANCE | Source: Ambulatory Visit

## 2014-02-18 ENCOUNTER — Other Ambulatory Visit: Payer: PRIVATE HEALTH INSURANCE

## 2014-02-19 ENCOUNTER — Telehealth: Payer: Self-pay | Admitting: Cardiology

## 2014-02-19 DIAGNOSIS — N1831 Chronic kidney disease, stage 3a: Secondary | ICD-10-CM

## 2014-02-19 DIAGNOSIS — N183 Chronic kidney disease, stage 3 (moderate): Principal | ICD-10-CM

## 2014-02-19 NOTE — Telephone Encounter (Signed)
Spoke to patient. Lab will be released for BMP, will cancel PT/INR , patient has an appointment with R[facility] Marion -D for INR CHECK. Patient aware lab is release

## 2014-02-19 NOTE — Telephone Encounter (Signed)
Mr.Grippi is calling because he state she needs a lab order to have some lab work done on Friday at [facility] ... Please call if you have any questions    Thanks

## 2014-02-20 ENCOUNTER — Encounter: Payer: Self-pay | Admitting: Podiatry

## 2014-02-20 ENCOUNTER — Ambulatory Visit (INDEPENDENT_AMBULATORY_CARE_PROVIDER_SITE_OTHER): Payer: Medicare Other | Admitting: Podiatry

## 2014-02-20 VITALS — BP 169/68 | HR 82 | Resp 12

## 2014-02-20 DIAGNOSIS — B351 Tinea unguium: Secondary | ICD-10-CM

## 2014-02-20 DIAGNOSIS — M79609 Pain in unspecified limb: Secondary | ICD-10-CM

## 2014-02-20 DIAGNOSIS — M79676 Pain in unspecified toe(s): Secondary | ICD-10-CM

## 2014-02-21 ENCOUNTER — Encounter: Payer: Self-pay | Admitting: Podiatry

## 2014-02-21 NOTE — Progress Notes (Signed)
Patient ID: [name], male   DOB: 1951/02/08, 63 y.o.   MRN: [id number]  Subjective: This patient presents today complaining of painful toenails. He says that his dog bit off the second right toenail and first right toenail  Objective: The second right toenail is not present. No erythema edema noted The right hallux toenail , distal one third not present, without any erythema or edema Toenails x8 are elongated, brittle, discolored, hypertrophic The interdigital spaces demonstrate poor hygiene  Assessment: Symptomatic onychomycoses times a day No clinical sign of infection right hallux and second right toenail area  Plan: Nails x8 are debrided without any bleeding  Reappoint x3 months

## 2014-02-22 ENCOUNTER — Ambulatory Visit (INDEPENDENT_AMBULATORY_CARE_PROVIDER_SITE_OTHER): Payer: PRIVATE HEALTH INSURANCE | Admitting: Pharmacist Clinician (PhC)/ Clinical Pharmacy Specialist

## 2014-02-22 DIAGNOSIS — Z5181 Encounter for therapeutic drug level monitoring: Secondary | ICD-10-CM

## 2014-02-22 DIAGNOSIS — I4891 Unspecified atrial fibrillation: Secondary | ICD-10-CM

## 2014-02-22 LAB — POCT INR: INR: 1.8

## 2014-03-01 ENCOUNTER — Ambulatory Visit
Admission: RE | Admit: 2014-03-01 | Discharge: 2014-03-01 | Disposition: A | Discharge status: Home / Self Care | Payer: PRIVATE HEALTH INSURANCE | Source: Ambulatory Visit | Attending: Anesthesiology | Admitting: Anesthesiology

## 2014-03-01 DIAGNOSIS — M25511 Pain in right shoulder: Secondary | ICD-10-CM

## 2014-03-04 ENCOUNTER — Encounter: Payer: PRIVATE HEALTH INSURANCE | Admitting: Physician Assistant

## 2014-03-04 NOTE — Progress Notes (Deleted)
Cardiology Office Note    Date:  03/04/2014   ID:  [name], DOB 05/27/1951, MRN [id number]  PCP:  [name], MD  Cardiologist:  Dr. Olga Millers      History of Present Illness: [name] is a 63 y.o. male with a hx of CAD s/p DES to RCA in 2012, PAFib s/p DCCV in 2010, diastolic CHF, DM2, HTN, HL, OSA, CKD.  Patient had nuclear study in 01/2014 for dyspnea that demonstrated lateral ischemia.  R/L heart cath demonstrated patent RCA stent and non-obstructive CAD elsewhere with elevated R and L heart filling pressures.  Patient developed a/c renal failure post cath (likely contrast induced nephropathy) with Cr 1.57 >>> 3.5.  ACEI and diuretics were held.  He was then admitted 8/3-8/6 with a/c diastolic CHF.  He was diuresed with IV Lasix with symptomatic improvement.  He was DC on a higher dose of Lasix at 80 mg bid.  Creatinine improved prior to DC (1.64).  He returns for FU.  ***     Studies:  - R/L HC (01/2014):  RA 15, RV 57/18, PA 53/23 mean 36, PCWP 20, CO 8.0, CI 2.9;  Mid LAD 40-50%, mid AV CFX 50%, prox RCA stent ok, mid RCA 30%, EF 65%.    - Echo (12/2013):  Mild LVH, EF 60-65%, no RWMA, Gr 1 DD, mild LAE  - Nuclear (11/2013):  Lateral ischemia, EF 61%; Intermediate risk   Recent Labs/Images: 11/27/2013: Pro B Natriuretic peptide (BNP) 54.0  02/11/2014: ALT 46; Hemoglobin 10.8*; TSH 0.408  02/14/2014: Creatinine 1.64*; Potassium 3.7    Wt Readings from Last 3 Encounters:  02/14/14 393 lb (178.264 kg)  02/11/14 400 lb 6.4 oz (181.62 kg)  02/08/14 402 lb (182.346 kg)     Past Medical History  Diagnosis Date  . Diabetes mellitus   . Hypertension   . Rheumatoid arthritis(714.0)   . Diastolic congestive heart failure   . History of MI (myocardial infarction)     SILENT MI IN PAST   . CAD (coronary artery disease)   . Atrial fibrillation   . Anemia   . Myocardial infarction     pt stated he had a slient heart attack that showed up on nuclear stress.  .  Sleep apnea     C-pap intolerant  . Pilonidal cyst 01/25/2013  . Shortness of breath     with ambulation, with exertion  . Chronic kidney disease     CKD stage 3  . Neuropathy   . History of blood transfusion   . Carpal tunnel syndrome, bilateral   . Morbid obesity     Current Outpatient Prescriptions  Medication Sig Dispense Refill  . ACCU-CHEK FASTCLIX LANCETS MISC       . amLODipine (NORVASC) 5 MG tablet Take 1 tablet (5 mg total) by mouth daily.  30 tablet  5  . Ferrous Sulfate (IRON) 325 (65 FE) MG TABS Take 1 tablet by mouth daily.      . furosemide (LASIX) 80 MG tablet Take 1 tablet (80 mg total) by mouth 2 (two) times daily.  60 tablet  5  . gabapentin (NEURONTIN) 300 MG capsule Take 1,200 mg by mouth daily.       . insulin regular human CONCENTRATED (HUMULIN R) 500 UNIT/ML SOLN injection Inject 90 Units into the skin 3 (three) times daily with meals.      [name] lisinopril-hydrochlorothiazide (PRINZIDE,ZESTORETIC) 20-12.5 MG per tablet Take 1 tablet by mouth daily.      [name]  Magnesium 400 MG TABS Take 400 mg by mouth daily.  30 tablet  11  . metoprolol tartrate (LOPRESSOR) 25 MG tablet Take 0.5 tablets (12.5 mg total) by mouth 2 (two) times daily.  60 tablet  5  . Multiple Vitamin (MULTIVITAMIN WITH MINERALS) TABS Take 1 tablet by mouth daily.      [name] oxycodone (ROXICODONE) 30 MG immediate release tablet Take 30 mg by mouth 4 (four) times daily.       . potassium chloride SA (KLOR-CON M20) 20 MEQ tablet Take 1 tablet (20 mEq total) by mouth daily.  30 tablet  4  . rosuvastatin (CRESTOR) 40 MG tablet Take 40 mg by mouth daily.      [name] spironolactone (ALDACTONE) 25 MG tablet Take 1 tablet by mouth daily.      [name] warfarin (COUMADIN) 5 MG tablet Take 5-7.5 mg by mouth See admin instructions. 1 tablet daily except 1.5 tablets on Thursdays or as directed by coumadin clinic       No current facility-administered medications for this visit.     Allergies:   Review of patient's allergies  indicates no known allergies.   Social History:  The patient  reports that he quit smoking about 30 years ago. His smoking use included Cigarettes. He has a 20 pack-year smoking history. He has never used smokeless tobacco. He reports that he does not drink alcohol or use illicit drugs.   Family History:  The patient's family history includes Other in his other.   ROS:  Please see the history of present illness.   ***   All other systems reviewed and negative.   PHYSICAL EXAM: VS:  There were no vitals taken for this visit. Well nourished, well developed, in no acute distress HEENT: normal Neck: ***no JVD Cardiac:  normal S1, S2; ***RRR; no murmur Lungs:  ***clear to auscultation bilaterally, no wheezing, rhonchi or rales Abd: soft, nontender, no hepatomegaly Ext: ***no edema Skin: warm and dry Neuro:  CNs 2-12 intact, no focal abnormalities noted  EKG:  ***     ASSESSMENT AND PLAN:  Chronic diastolic congestive heart failure  CKD (chronic kidney disease) stage 3, GFR 30-59 ml/min  Coronary artery disease   Paroxysmal atrial fibrillation  Essential hypertension, benign  HYPERLIPIDEMIA-MIXED  Diabetes mellitus type 2, uncontrolled, with complications   Disposition:  ***   Signed, Brynda Rim, MHS 03/04/2014 11:19 AM    [facility] [address] [place], [place], [place]  [address] Phone: [phone]; Fax: [phone]   This encounter was created in error - please disregard.

## 2014-03-22 ENCOUNTER — Ambulatory Visit (INDEPENDENT_AMBULATORY_CARE_PROVIDER_SITE_OTHER): Payer: PRIVATE HEALTH INSURANCE | Admitting: Pharmacist Clinician (PhC)/ Clinical Pharmacy Specialist

## 2014-03-22 ENCOUNTER — Ambulatory Visit (INDEPENDENT_AMBULATORY_CARE_PROVIDER_SITE_OTHER): Payer: PRIVATE HEALTH INSURANCE | Admitting: Physician Assistant

## 2014-03-22 ENCOUNTER — Encounter: Payer: Self-pay | Admitting: Physician Assistant

## 2014-03-22 VITALS — BP 156/80 | HR 86 | Ht 72.0 in | Wt 389.5 lb

## 2014-03-22 DIAGNOSIS — G4733 Obstructive sleep apnea (adult) (pediatric): Secondary | ICD-10-CM

## 2014-03-22 DIAGNOSIS — N183 Chronic kidney disease, stage 3 unspecified: Secondary | ICD-10-CM

## 2014-03-22 DIAGNOSIS — I509 Heart failure, unspecified: Secondary | ICD-10-CM

## 2014-03-22 DIAGNOSIS — N179 Acute kidney failure, unspecified: Secondary | ICD-10-CM

## 2014-03-22 DIAGNOSIS — I10 Essential (primary) hypertension: Secondary | ICD-10-CM

## 2014-03-22 DIAGNOSIS — I5032 Chronic diastolic (congestive) heart failure: Secondary | ICD-10-CM

## 2014-03-22 DIAGNOSIS — I4891 Unspecified atrial fibrillation: Secondary | ICD-10-CM

## 2014-03-22 DIAGNOSIS — I2510 Atherosclerotic heart disease of native coronary artery without angina pectoris: Secondary | ICD-10-CM

## 2014-03-22 DIAGNOSIS — E785 Hyperlipidemia, unspecified: Secondary | ICD-10-CM

## 2014-03-22 DIAGNOSIS — Z5181 Encounter for therapeutic drug level monitoring: Secondary | ICD-10-CM

## 2014-03-22 DIAGNOSIS — I480 Paroxysmal atrial fibrillation: Secondary | ICD-10-CM

## 2014-03-22 LAB — BASIC METABOLIC PANEL
BUN: 20 mg/dL (ref 6–23)
CALCIUM: 9 mg/dL (ref 8.4–10.5)
CO2: 25 meq/L (ref 19–32)
Chloride: 97 mEq/L (ref 96–112)
Creat: 1.78 mg/dL — ABNORMAL HIGH (ref 0.50–1.35)
Glucose, Bld: 140 mg/dL — ABNORMAL HIGH (ref 70–99)
Potassium: 3.5 mEq/L (ref 3.5–5.3)
SODIUM: 136 meq/L (ref 135–145)

## 2014-03-22 LAB — POCT INR: INR: 1.7

## 2014-03-22 MED ORDER — AMLODIPINE BESYLATE 10 MG PO TABS: 10.0000 mg | ORAL_TABLET | Freq: Every day | ORAL | Status: DC

## 2014-03-22 NOTE — Assessment & Plan Note (Signed)
Regular rate and rhythm on exam 

## 2014-03-22 NOTE — Assessment & Plan Note (Signed)
Encouraged weight loss. Offered referral to registered dietitian.  I given her contact information should he change his mind.  Also encouraged exercise.

## 2014-03-22 NOTE — Progress Notes (Signed)
Date:  03/22/2014   ID:  [name], DOB [birth year], MRN [id number]  PCP:  [name], MD  Primary Cardiologist:  [name]    History of Present Illness: [name] is a 63 y.o. morbidly obese  male with AF and CAD. He has been previously cardioverted back in 2010. He has had PCI of the RCA in 2012 with DES. Last EF normal in June of 2015. EF 60-65% with grade 1 diastolic dysfunction.  Small oscillating density on the aortic valve could not be ruled out. Myoview from June of 2015 with partially reversible apical lateral defect. Other issues include DM, CKD, deconditioning and OSA.   Seen last month by Dr. [name] with worsening dyspnea. Was cathed on 7/23. Elevated pressures with mild to moderate disease noted. 75 cc contrast given. Has had worsening kidney function since then.  He was hospitalized last month due to CHF   He was initially given IV lasix 60mg  BID then increased to 80 BID. This resulted in net fluid loss of 4.0L. He was transitioned back to oral lasix 80mg  BID. He uses Humulin insulin at home.  A1C is 9.6. He refused to wear the CPAP. No ACE-I or Aldactone at this time.  Dr. follows his diabetes.  The patient presents today for post hospital evaluation. Reports feeling much better and feels he is back to baseline.  He does have some shortness of breath. He does sleep in his lower chair and reports that he is CPAP mask is not a good fit and he must use the nasal pillow. He doesn't use it all the time because of his sinuses.  Weight today is 389 pounds and was 393 on August 6.  He does not weight himself every day but does frequently.   Mild lower extremity but improved significantlyedema  The patient currently denies nausea, vomiting, fever, chest pain, orthopnea, dizziness, PND, cough, congestion, abdominal pain, hematochezia, melena,  claudication.  Wt Readings from Last 3 Encounters:  03/22/14 389 lb 8 oz (176.676 kg)  02/14/14 393 lb (178.264 kg)  02/11/14  400 lb 6.4 oz (181.62 kg)     Past Medical History  Diagnosis Date  . Diabetes mellitus   . Hypertension   . Rheumatoid arthritis(714.0)   . Diastolic congestive heart failure   . History of MI (myocardial infarction)     SILENT MI IN PAST   . CAD (coronary artery disease)   . Atrial fibrillation   . Anemia   . Myocardial infarction     pt stated he had a slient heart attack that showed up on nuclear stress.  . Sleep apnea     C-pap intolerant  . Pilonidal cyst 01/25/2013  . Shortness of breath     with ambulation, with exertion  . Chronic kidney disease     CKD stage 3  . Neuropathy   . History of blood transfusion   . Carpal tunnel syndrome, bilateral   . Morbid obesity     Current Outpatient Prescriptions  Medication Sig Dispense Refill  . ACCU-CHEK FASTCLIX LANCETS MISC       . amLODipine (NORVASC) 10 MG tablet Take 1 tablet (10 mg total) by mouth daily.  30 tablet  5  . Ferrous Sulfate (IRON) 325 (65 FE) MG TABS Take 1 tablet by mouth daily.      . furosemide (LASIX) 80 MG tablet Take 1 tablet (80 mg total) by mouth 2 (two) times daily.  60 tablet  5  .  gabapentin (NEURONTIN) 300 MG capsule Take 1,200 mg by mouth daily.       . insulin regular human CONCENTRATED (HUMULIN R) 500 UNIT/ML SOLN injection Inject 90 Units into the skin 3 (three) times daily with meals.      . Magnesium 400 MG TABS Take 400 mg by mouth daily.  30 tablet  11  . metoprolol tartrate (LOPRESSOR) 25 MG tablet Take 0.5 tablets (12.5 mg total) by mouth 2 (two) times daily.  60 tablet  5  . Multiple Vitamin (MULTIVITAMIN WITH MINERALS) TABS Take 1 tablet by mouth daily.      [name] oxycodone (ROXICODONE) 30 MG immediate release tablet Take 30 mg by mouth 4 (four) times daily.       . potassium chloride SA (KLOR-CON M20) 20 MEQ tablet Take 1 tablet (20 mEq total) by mouth daily.  30 tablet  4  . rosuvastatin (CRESTOR) 40 MG tablet Take 40 mg by mouth daily.      [name] spironolactone (ALDACTONE) 25 MG tablet  Take 1 tablet by mouth daily.      . TRULICITY 0.75 MG/0.5ML SOPN Take 1 tablet by mouth once a week.      . warfarin (COUMADIN) 5 MG tablet Take 5-7.5 mg by mouth See admin instructions. 1 tablet daily except 1.5 tablets on Thursdays or as directed by coumadin clinic       No current facility-administered medications for this visit.    Allergies:   No Known Allergies  Social History:  The patient  reports that he quit smoking about 30 years ago. His smoking use included Cigarettes. He has a 20 pack-year smoking history. He has never used smokeless tobacco. He reports that he does not drink alcohol or use illicit drugs.   Family history:   Family History  Problem Relation Age of Onset  . Other Other     PT ADOPTED    ROS:  Please see the history of present illness.  All other systems reviewed and negative.   PHYSICAL EXAM: VS:  Ht 6' (1.829 m)  Wt 389 lb 8 oz (176.676 kg)  BMI 52.81 kg/m2 Pes, well developed, in no acute distress HEENT: Pupils are equal round react to light accommodation extraocular movements are intact.  Neck: Normal range of motion. Cardiac: Regular rate and rhythm without murmurs rubs or gallops. Lungs:  clear to auscultation bilaterally, no wheezing, rhonchi or rales Abd: soft, nontender, positive bowel  Ext: 1+ lower extremity edema.  2+ radial and dorsalis pedis pulses. Skin: warm and dry Neuro:  Grossly normal  EKG:  None   ASSESSMENT AND PLAN:  Problem List Items Addressed This Visit   HYPERLIPIDEMIA-MIXED (Chronic)      On statin   Lipid Panel     Component Value Date/Time   CHOL 143 08/31/2011 0954   TRIG 212.0* 08/31/2011 0954   HDL 37.30* 08/31/2011 0954   CHOLHDL 4 08/31/2011 0954   VLDL 42.4* 08/31/2011 0954   LDLCALC 101* 11/06/2009 2045        Relevant Medications      amLODIpine (NORVASC) tablet   Other Relevant Orders      Lipid Profile   OBESITY-MORBID BMI 54 (Chronic)     Encouraged weight loss. Offered referral to registered  dietitian.  I given her contact information should he change his mind.  Also encouraged exercise.    Relevant Medications      TRULICITY 0.75 MG/0.5ML SOPN   Essential hypertension, benign (Chronic)  Increase amlodipine to 10 mg daily    Relevant Medications      amLODIpine (NORVASC) tablet   Atrial fibrillation- DCCV 2010 (Chronic)     Regular rate and rhythm on exam    Relevant Medications      amLODIpine (NORVASC) tablet   Obstructive sleep apnea- unable to tolerate c-pap (Chronic)   Coronary Artery Disease (Chronic)     No angina.    Relevant Medications      amLODIpine (NORVASC) tablet   Chronic diastolic congestive heart failure     Patient has mild lower extremity edema otherwise he feels he is at baseline. Continue current diuretic therapy.  Encourage daily weights low-sodium diet.    Relevant Medications      amLODIpine (NORVASC) tablet   CKD (chronic kidney disease) stage 3, GFR 30-59 ml/min     Check BMET today     Other Visit Diagnoses   AKI (acute kidney injury)    -  Primary    Relevant Orders       Basic Metabolic Panel (BMET)

## 2014-03-22 NOTE — Assessment & Plan Note (Signed)
Patient has mild lower extremity edema otherwise he feels he is at baseline. Continue current diuretic therapy.  Encourage daily weights low-sodium diet.

## 2014-03-22 NOTE — Assessment & Plan Note (Signed)
Check BMET today 

## 2014-03-22 NOTE — Assessment & Plan Note (Signed)
On statin   Lipid Panel     Component Value Date/Time   CHOL 143 08/31/2011 0954   TRIG 212.0* 08/31/2011 0954   HDL 37.30* 08/31/2011 0954   CHOLHDL 4 08/31/2011 0954   VLDL 42.4* 08/31/2011 0954   LDLCALC 101* 11/06/2009 2045

## 2014-03-22 NOTE — Assessment & Plan Note (Signed)
Increase amlodipine to 10 mg daily

## 2014-03-22 NOTE — Assessment & Plan Note (Signed)
No angina 

## 2014-03-22 NOTE — Patient Instructions (Addendum)
Labs:  BMET Follow up with Dr. [name] in 3 months. Increase amlodipine to 10mg  daily

## 2014-04-05 ENCOUNTER — Ambulatory Visit: Payer: PRIVATE HEALTH INSURANCE | Admitting: Pharmacist Clinician (PhC)/ Clinical Pharmacy Specialist

## 2014-04-12 ENCOUNTER — Ambulatory Visit: Payer: PRIVATE HEALTH INSURANCE | Admitting: Pharmacist Clinician (PhC)/ Clinical Pharmacy Specialist

## 2014-04-24 ENCOUNTER — Ambulatory Visit (INDEPENDENT_AMBULATORY_CARE_PROVIDER_SITE_OTHER): Payer: PRIVATE HEALTH INSURANCE | Admitting: Pharmacist Clinician (PhC)/ Clinical Pharmacy Specialist

## 2014-04-24 DIAGNOSIS — Z5181 Encounter for therapeutic drug level monitoring: Secondary | ICD-10-CM

## 2014-04-24 DIAGNOSIS — I480 Paroxysmal atrial fibrillation: Secondary | ICD-10-CM

## 2014-04-24 DIAGNOSIS — I4891 Unspecified atrial fibrillation: Secondary | ICD-10-CM

## 2014-04-24 LAB — POCT INR: INR: 1.6

## 2014-05-20 NOTE — Progress Notes (Signed)
HPI:  FU atrial fibrillation and CAD. Transesophageal echocardiogram in November of 2010 revealed normal LV function, moderate left atrial enlargement, mild right atrial enlargement, possible left atrial appendage thrombus and mild mitral regurgitation. Note a TSH was low but free T3 and free T4 were normal. He has been followed by endocrinology for this. We scheduled a cardioversion which was performed on June 18, 2009. The procedure was successful. Patient had PCI of his right coronary artery in August of 2012 in [place]. He had a drug-eluting stent. ABIs in April of 2013 were unremarkable. Echo 6/15 showed normal LV function, mild LAE and grade 1 diastolic dysfunction; cannot R/O small oscillating density on aortic valve. Nuclear study 6/15 showed EF 61 with partially reversible apical lateral defect. Cardiac catheterization July 2015 showed a 40-50% LAD, 50% circumflex and patent right coronary artery stent. Ejection fraction 65%. Pulmonic capillary wedge pressure 20. Developed renal insufficiency following procedure, and then worsening diastolic congestive heart failure which improved with diuresis. Since last seen,   Current Outpatient Prescriptions  Medication Sig Dispense Refill  . ACCU-CHEK FASTCLIX LANCETS MISC     . amLODipine (NORVASC) 10 MG tablet Take 1 tablet (10 mg total) by mouth daily. 30 tablet 5  . Ferrous Sulfate (IRON) 325 (65 FE) MG TABS Take 1 tablet by mouth daily.    . furosemide (LASIX) 80 MG tablet Take 1 tablet (80 mg total) by mouth 2 (two) times daily. 60 tablet 5  . gabapentin (NEURONTIN) 300 MG capsule Take 1,200 mg by mouth daily.     . insulin regular human CONCENTRATED (HUMULIN R) 500 UNIT/ML SOLN injection Inject 90 Units into the skin 3 (three) times daily with meals.    . Magnesium 400 MG TABS Take 400 mg by mouth daily. 30 tablet 11  . metoprolol tartrate (LOPRESSOR) 25 MG tablet Take 0.5 tablets (12.5 mg total) by mouth 2 (two) times daily. 60  tablet 5  . Multiple Vitamin (MULTIVITAMIN WITH MINERALS) TABS Take 1 tablet by mouth daily.    [name] oxycodone (ROXICODONE) 30 MG immediate release tablet Take 30 mg by mouth 4 (four) times daily.     . potassium chloride SA (KLOR-CON M20) 20 MEQ tablet Take 1 tablet (20 mEq total) by mouth daily. 30 tablet 4  . rosuvastatin (CRESTOR) 40 MG tablet Take 40 mg by mouth daily.    [name] spironolactone (ALDACTONE) 25 MG tablet Take 1 tablet by mouth daily.    . TRULICITY 0.75 MG/0.5ML SOPN Take 1 tablet by mouth once a week.    . warfarin (COUMADIN) 5 MG tablet Take 5-7.5 mg by mouth See admin instructions. 1 tablet daily except 1.5 tablets on Thursdays or as directed by coumadin clinic     No current facility-administered medications for this visit.     Past Medical History  Diagnosis Date  . Diabetes mellitus   . Hypertension   . Rheumatoid arthritis(714.0)   . Diastolic congestive heart failure   . History of MI (myocardial infarction)     SILENT MI IN PAST   . CAD (coronary artery disease)   . Atrial fibrillation   . Anemia   . Myocardial infarction     pt stated he had a slient heart attack that showed up on nuclear stress.  . Sleep apnea     C-pap intolerant  . Pilonidal cyst 01/25/2013  . Shortness of breath     with ambulation, with exertion  . Chronic kidney disease  CKD stage 3  . Neuropathy   . History of blood transfusion   . Carpal tunnel syndrome, bilateral   . Morbid obesity     Past Surgical History  Procedure Laterality Date  . Abdominal surgery  AGE 33 MONTHS    BENIGN TUMOR REMOVED  . Debridement  foot  FEW YRS AGO    LEFT FOOT, WOUNDS ALL HEALED NOW  . Coronary angioplasty with stent placement  August 2012    RCA DES - [facility]  . Cataract extraction w/phaco Right 11/15/2012    Procedure: CATARACT EXTRACTION PHACO AND INTRAOCULAR LENS PLACEMENT (IOC);  Surgeon: Shade Flood, MD;  Location[facility] OR;  Service: Ophthalmology;   Laterality: Right;  . Cataract extraction w/phaco Left 11/29/2012    Procedure: CATARACT EXTRACTION PHACO AND INTRAOCULAR LENS PLACEMENT (IOC);  Surgeon: Shade Flood, MD;  Location: [facility] OR;  Service: Ophthalmology;  Laterality: Left;  . Foot surgery    . Pilonidal cyst excision N/A 01/08/2013    Procedure: CYST EXCISION PILONIDAL EXTENSIVE;  Surgeon: Axel Filler, MD;  Location: [facility] OR;  Service: General;  Laterality: N/A;  . Foreign body removal Right 2014    Right heel splinter removal   . Eye surgery Bilateral     cataract  . Pars plana vitrectomy Left 06/05/2013    Procedure: PARS PLANA VITRECTOMY WITH 23 GAUGE with Endolaser(constellation);  Surgeon: Shade Flood, MD;  Location: [facility] OR;  Service: Ophthalmology;  Laterality: Left;    History   Social History  . Marital Status: Single    Spouse Name: N/A    Number of Children: Y  . Years of Education: N/A   Occupational History  . disabled truck driver    Social History Main Topics  . Smoking status: Former Smoker -- 1.00 packs/day for 20 years    Types: Cigarettes    Quit date: 07/13/1983  . Smokeless tobacco: Never Used  . Alcohol Use: No  . Drug Use: No  . Sexual Activity: Not on file   Other Topics Concern  . Not on file   Social History Narrative   Pt is single.   Lives with sister.   Has children.   Was adopted.   Daily cafffiene-2 cups of coffee and 2 sodas per day          ROS: no fevers or chills, productive cough, hemoptysis, dysphasia, odynophagia, melena, hematochezia, dysuria, hematuria, rash, seizure activity, orthopnea, PND, pedal edema, claudication. Remaining systems are negative.  Physical Exam: Well-developed well-nourished in no acute distress.  Skin is warm and dry.  HEENT is normal.  Neck is supple.  Chest is clear to auscultation with normal expansion.  Cardiovascular exam is regular rate and rhythm.  Abdominal exam nontender or distended. No masses palpated. Extremities show no  edema. neuro grossly intact  ECG     This encounter was created in error - please disregard.

## 2014-05-21 ENCOUNTER — Encounter: Payer: PRIVATE HEALTH INSURANCE | Admitting: Cardiology

## 2014-06-03 ENCOUNTER — Ambulatory Visit: Payer: Medicare Other | Admitting: Podiatry

## 2014-06-05 ENCOUNTER — Ambulatory Visit: Payer: PRIVATE HEALTH INSURANCE | Admitting: Pharmacist Clinician (PhC)/ Clinical Pharmacy Specialist

## 2014-06-19 ENCOUNTER — Ambulatory Visit (INDEPENDENT_AMBULATORY_CARE_PROVIDER_SITE_OTHER): Payer: Medicare Other | Admitting: Podiatry

## 2014-06-19 VITALS — BP 154/86 | HR 72 | Resp 12

## 2014-06-19 DIAGNOSIS — B351 Tinea unguium: Secondary | ICD-10-CM

## 2014-06-19 DIAGNOSIS — M79676 Pain in unspecified toe(s): Secondary | ICD-10-CM

## 2014-06-19 NOTE — Progress Notes (Signed)
Patient ID: [name], male   DOB: 24-Oct-1950, 63 y.o.   MRN: [id number]  Subjective: This patient presents for ongoing debridement of painful toenails  Objective: Second right toenails not present with eschar over nailbed. There is no erythema, edema, drainage noted Remaining toenails are elongated, hypertrophic, brittle, discolored 6-10  Assessment: Symptomatic onychomycoses 6-10  Plan: Debrided toenails 6-10 without a bleeding  Reappoint at three-month intervals

## 2014-06-20 ENCOUNTER — Encounter (HOSPITAL_COMMUNITY): Payer: Self-pay | Admitting: Cardiovascular Disease

## 2014-06-26 NOTE — Progress Notes (Signed)
HPI: FU atrial fibrillation and CAD. Transesophageal echocardiogram in November of 2010 revealed normal LV function, moderate left atrial enlargement, mild right atrial enlargement, possible left atrial appendage thrombus and mild mitral regurgitation. Note a TSH was low but free T3 and free T4 were normal. He has been followed by endocrinology for this. We scheduled a cardioversion which was performed on June 18, 2009. The procedure was successful. Patient had PCI of his right coronary artery in August of 2012 in [place]. He had a drug-eluting stent. ABIs in April of 2013 were unremarkable. Echo 6/15 showed normal LV function, mild LAE and grade 1 diastolic dysfunction; cannot R/O small oscillating density on aortic valve. Nuclear study 6/15 showed EF 61 with partially reversible apical lateral defect. Cardiac catheterization July 2015 showed a pulmonary capillary wedge pressure of 20, 40-50% LAD, 50% circumflex and 30% PDA. The stent in the right coronary was patent. Ejection fraction 65%. He developed contrast nephropathy following the procedure. His Lasix, ACE inhibitor and spironolactone were held and he then developed acute diastolic congestive heart failure. This improved with diuresis. Since last seen, He does note dyspnea on exertion. No orthopnea, PND or chest pain. Minimal pedal edema has improved with higher dose diuretics.  Current Outpatient Prescriptions  Medication Sig Dispense Refill  . ACCU-CHEK FASTCLIX LANCETS MISC     . amLODipine (NORVASC) 10 MG tablet Take 1 tablet (10 mg total) by mouth daily. 30 tablet 5  . Ferrous Sulfate (IRON) 325 (65 FE) MG TABS Take 1 tablet by mouth daily.    . furosemide (LASIX) 80 MG tablet Take 1 tablet (80 mg total) by mouth 2 (two) times daily. 60 tablet 5  . gabapentin (NEURONTIN) 300 MG capsule Take 1,200 mg by mouth daily.     . insulin regular human CONCENTRATED (HUMULIN R) 500 UNIT/ML SOLN injection Inject 90 Units into the skin 3  (three) times daily with meals.    . Magnesium 400 MG TABS Take 400 mg by mouth daily. 30 tablet 11  . metoprolol tartrate (LOPRESSOR) 25 MG tablet Take 0.5 tablets (12.5 mg total) by mouth 2 (two) times daily. 60 tablet 5  . Multiple Vitamin (MULTIVITAMIN WITH MINERALS) TABS Take 1 tablet by mouth daily.    [name] oxycodone (ROXICODONE) 30 MG immediate release tablet Take 30 mg by mouth 4 (four) times daily.     . potassium chloride SA (KLOR-CON M20) 20 MEQ tablet Take 1 tablet (20 mEq total) by mouth daily. 30 tablet 4  . rosuvastatin (CRESTOR) 40 MG tablet Take 40 mg by mouth daily.    [name] spironolactone (ALDACTONE) 25 MG tablet Take 1 tablet by mouth daily.    . TRULICITY 0.75 MG/0.5ML SOPN Take 1 tablet by mouth once a week.    . warfarin (COUMADIN) 5 MG tablet Take 5-7.5 mg by mouth See admin instructions. 1 tablet daily except 1.5 tablets on Thursdays or as directed by coumadin clinic     No current facility-administered medications for this visit.     Past Medical History  Diagnosis Date  . Diabetes mellitus   . Hypertension   . Rheumatoid arthritis(714.0)   . Diastolic congestive heart failure   . History of MI (myocardial infarction)     SILENT MI IN PAST   . CAD (coronary artery disease)   . Atrial fibrillation   . Anemia   . Myocardial infarction     pt stated he had a slient heart attack that showed up  on nuclear stress.  . Sleep apnea     C-pap intolerant  . Pilonidal cyst 01/25/2013  . Shortness of breath     with ambulation, with exertion  . Chronic kidney disease     CKD stage 3  . Neuropathy   . History of blood transfusion   . Carpal tunnel syndrome, bilateral   . Morbid obesity     Past Surgical History  Procedure Laterality Date  . Abdominal surgery  AGE 74 MONTHS    BENIGN TUMOR REMOVED  . Debridement  foot  FEW YRS AGO    LEFT FOOT, WOUNDS ALL HEALED NOW  . Coronary angioplasty with stent placement  August 2012    RCA DES - [facility]  . Cataract extraction w/phaco Right 11/15/2012    Procedure: CATARACT EXTRACTION PHACO AND INTRAOCULAR LENS PLACEMENT (IOC);  Surgeon: Shade Flood, MD;  Location: [facility] OR;  Service: Ophthalmology;  Laterality: Right;  . Cataract extraction w/phaco Left 11/29/2012    Procedure: CATARACT EXTRACTION PHACO AND INTRAOCULAR LENS PLACEMENT (IOC);  Surgeon: Shade Flood, MD;  Location: [facility] OR;  Service: Ophthalmology;  Laterality: Left;  . Foot surgery    . Pilonidal cyst excision N/A 01/08/2013    Procedure: CYST EXCISION PILONIDAL EXTENSIVE;  Surgeon: Axel Filler, MD;  Location: [facility] OR;  Service: General;  Laterality: N/A;  . Foreign body removal Right 2014    Right heel splinter removal   . Eye surgery Bilateral     cataract  . Pars plana vitrectomy Left 06/05/2013    Procedure: PARS PLANA VITRECTOMY WITH 23 GAUGE with Endolaser(constellation);  Surgeon: Shade Flood, MD;  Location: [facility] OR;  Service: Ophthalmology;  Laterality: Left;  . Left and right heart catheterization with coronary angiogram N/A 01/31/2014    Procedure: LEFT AND RIGHT HEART CATHETERIZATION WITH CORONARY ANGIOGRAM;  Surgeon: Micheline Chapman, MD;  Location: [facility] CATH LAB;  Service: Cardiovascular;  Laterality: N/A;    History   Social History  . Marital Status: Single    Spouse Name: N/A    Number of Children: Y  . Years of Education: N/A   Occupational History  . disabled truck driver    Social History Main Topics  . Smoking status: Former Smoker -- 1.00 packs/day for 20 years    Types: Cigarettes    Quit date: 07/13/1983  . Smokeless tobacco: Never Used  . Alcohol Use: No  . Drug Use: No  . Sexual Activity: Not on file   Other Topics Concern  . Not on file   Social History Narrative   Pt is single.   Lives with sister.   Has children.   Was adopted.   Daily cafffiene-2 cups of coffee and 2 sodas per day          ROS: no fevers or chills, productive cough, hemoptysis, dysphasia, odynophagia,  melena, hematochezia, dysuria, hematuria, rash, seizure activity, orthopnea, PND, pedal edema, claudication. Remaining systems are negative.  Physical Exam: Well-developed well-nourished in no acute distress.  Skin is warm and dry.  HEENT is normal.  Neck is supple.  Chest is clear to auscultation with normal expansion.  Cardiovascular exam is regular rate and rhythm.  Abdominal exam nontender or distended. No masses palpated. Extremities show no edema. neuro grossly intact  ECG     This encounter was created in error - please disregard.

## 2014-06-27 ENCOUNTER — Encounter: Payer: PRIVATE HEALTH INSURANCE | Admitting: Cardiology

## 2014-06-28 ENCOUNTER — Encounter: Payer: Self-pay | Admitting: Cardiology

## 2014-07-26 ENCOUNTER — Ambulatory Visit: Payer: Medicaid Other | Admitting: Pharmacist Clinician (PhC)/ Clinical Pharmacy Specialist

## 2014-08-09 ENCOUNTER — Other Ambulatory Visit: Payer: Self-pay | Admitting: *Deleted

## 2014-08-09 MED ORDER — POTASSIUM CHLORIDE CRYS ER 20 MEQ PO TBCR: 20.0000 meq | EXTENDED_RELEASE_TABLET | Freq: Every day | ORAL | Status: DC

## 2014-08-13 ENCOUNTER — Telehealth: Payer: Self-pay | Admitting: *Deleted

## 2014-08-13 ENCOUNTER — Telehealth: Payer: Self-pay

## 2014-08-13 NOTE — Telephone Encounter (Signed)
Spoke with pt and made an appt for him to be seen in coumadin clinic on Thursday February 4th as he has not been seen since October 14th and needs refill on his coumadin

## 2014-08-14 ENCOUNTER — Other Ambulatory Visit: Payer: Self-pay | Admitting: *Deleted

## 2014-08-15 ENCOUNTER — Ambulatory Visit (INDEPENDENT_AMBULATORY_CARE_PROVIDER_SITE_OTHER): Payer: Medicare Other | Admitting: *Deleted

## 2014-08-15 DIAGNOSIS — I480 Paroxysmal atrial fibrillation: Secondary | ICD-10-CM

## 2014-08-15 DIAGNOSIS — I4891 Unspecified atrial fibrillation: Secondary | ICD-10-CM

## 2014-08-15 DIAGNOSIS — Z5181 Encounter for therapeutic drug level monitoring: Secondary | ICD-10-CM

## 2014-08-15 LAB — POCT INR: INR: 1.1

## 2014-08-15 MED ORDER — WARFARIN SODIUM 5 MG PO TABS: ORAL_TABLET | ORAL | Status: DC

## 2014-08-15 NOTE — Telephone Encounter (Signed)
Refill done on pt's coumadin encounter 08/15/2014

## 2014-09-18 ENCOUNTER — Ambulatory Visit: Payer: Medicare Other | Admitting: Podiatry

## 2014-09-18 ENCOUNTER — Other Ambulatory Visit: Payer: Self-pay

## 2014-09-18 MED ORDER — SPIRONOLACTONE 25 MG PO TABS: 25.0000 mg | ORAL_TABLET | Freq: Every day | ORAL | Status: DC

## 2014-09-19 ENCOUNTER — Ambulatory Visit (INDEPENDENT_AMBULATORY_CARE_PROVIDER_SITE_OTHER): Payer: Medicare Other | Admitting: Physician Assistant

## 2014-09-19 ENCOUNTER — Encounter: Payer: Self-pay | Admitting: Physician Assistant

## 2014-09-19 ENCOUNTER — Encounter (HOSPITAL_COMMUNITY): Payer: Self-pay | Admitting: General Practice

## 2014-09-19 ENCOUNTER — Inpatient Hospital Stay (HOSPITAL_COMMUNITY)
Admission: AD | Admit: 2014-09-19 | Discharge: 2014-09-24 | DRG: 308 | Disposition: A | Discharge status: Home / Self Care | Payer: Medicare Other | Source: Ambulatory Visit | Attending: Cardiology | Admitting: Cardiology

## 2014-09-19 ENCOUNTER — Ambulatory Visit (INDEPENDENT_AMBULATORY_CARE_PROVIDER_SITE_OTHER): Payer: Medicare Other | Admitting: Pharmacist

## 2014-09-19 VITALS — BP 132/78 | HR 107 | Ht 72.0 in | Wt >= 6400 oz

## 2014-09-19 DIAGNOSIS — Z9119 Patient's noncompliance with other medical treatment and regimen: Secondary | ICD-10-CM | POA: Diagnosis present

## 2014-09-19 DIAGNOSIS — E785 Hyperlipidemia, unspecified: Secondary | ICD-10-CM | POA: Diagnosis present

## 2014-09-19 DIAGNOSIS — Z6841 Body Mass Index (BMI) 40.0 and over, adult: Secondary | ICD-10-CM

## 2014-09-19 DIAGNOSIS — J80 Acute respiratory distress syndrome: Secondary | ICD-10-CM | POA: Diagnosis present

## 2014-09-19 DIAGNOSIS — I252 Old myocardial infarction: Secondary | ICD-10-CM

## 2014-09-19 DIAGNOSIS — E118 Type 2 diabetes mellitus with unspecified complications: Secondary | ICD-10-CM | POA: Diagnosis not present

## 2014-09-19 DIAGNOSIS — G629 Polyneuropathy, unspecified: Secondary | ICD-10-CM | POA: Diagnosis present

## 2014-09-19 DIAGNOSIS — J9601 Acute respiratory failure with hypoxia: Secondary | ICD-10-CM

## 2014-09-19 DIAGNOSIS — D649 Anemia, unspecified: Secondary | ICD-10-CM | POA: Diagnosis present

## 2014-09-19 DIAGNOSIS — I5032 Chronic diastolic (congestive) heart failure: Secondary | ICD-10-CM | POA: Diagnosis present

## 2014-09-19 DIAGNOSIS — I10 Essential (primary) hypertension: Secondary | ICD-10-CM | POA: Diagnosis present

## 2014-09-19 DIAGNOSIS — Z5181 Encounter for therapeutic drug level monitoring: Secondary | ICD-10-CM

## 2014-09-19 DIAGNOSIS — E1165 Type 2 diabetes mellitus with hyperglycemia: Secondary | ICD-10-CM

## 2014-09-19 DIAGNOSIS — IMO0002 Reserved for concepts with insufficient information to code with codable children: Secondary | ICD-10-CM

## 2014-09-19 DIAGNOSIS — N183 Chronic kidney disease, stage 3 unspecified: Secondary | ICD-10-CM

## 2014-09-19 DIAGNOSIS — I2510 Atherosclerotic heart disease of native coronary artery without angina pectoris: Secondary | ICD-10-CM | POA: Diagnosis present

## 2014-09-19 DIAGNOSIS — I129 Hypertensive chronic kidney disease with stage 1 through stage 4 chronic kidney disease, or unspecified chronic kidney disease: Secondary | ICD-10-CM | POA: Diagnosis present

## 2014-09-19 DIAGNOSIS — J9600 Acute respiratory failure, unspecified whether with hypoxia or hypercapnia: Secondary | ICD-10-CM | POA: Insufficient documentation

## 2014-09-19 DIAGNOSIS — I480 Paroxysmal atrial fibrillation: Secondary | ICD-10-CM

## 2014-09-19 DIAGNOSIS — R0602 Shortness of breath: Secondary | ICD-10-CM | POA: Diagnosis present

## 2014-09-19 DIAGNOSIS — Z7901 Long term (current) use of anticoagulants: Secondary | ICD-10-CM

## 2014-09-19 DIAGNOSIS — R0600 Dyspnea, unspecified: Secondary | ICD-10-CM

## 2014-09-19 DIAGNOSIS — G4733 Obstructive sleep apnea (adult) (pediatric): Secondary | ICD-10-CM | POA: Diagnosis present

## 2014-09-19 DIAGNOSIS — I340 Nonrheumatic mitral (valve) insufficiency: Secondary | ICD-10-CM | POA: Diagnosis not present

## 2014-09-19 DIAGNOSIS — Z87891 Personal history of nicotine dependence: Secondary | ICD-10-CM | POA: Diagnosis not present

## 2014-09-19 DIAGNOSIS — Z9861 Coronary angioplasty status: Secondary | ICD-10-CM

## 2014-09-19 DIAGNOSIS — I5033 Acute on chronic diastolic (congestive) heart failure: Secondary | ICD-10-CM | POA: Diagnosis present

## 2014-09-19 DIAGNOSIS — E66813 Obesity, class 3: Secondary | ICD-10-CM | POA: Diagnosis present

## 2014-09-19 DIAGNOSIS — M069 Rheumatoid arthritis, unspecified: Secondary | ICD-10-CM | POA: Diagnosis present

## 2014-09-19 DIAGNOSIS — L02419 Cutaneous abscess of limb, unspecified: Secondary | ICD-10-CM | POA: Diagnosis present

## 2014-09-19 DIAGNOSIS — G5600 Carpal tunnel syndrome, unspecified upper limb: Secondary | ICD-10-CM | POA: Diagnosis present

## 2014-09-19 DIAGNOSIS — L03119 Cellulitis of unspecified part of limb: Secondary | ICD-10-CM

## 2014-09-19 DIAGNOSIS — I4891 Unspecified atrial fibrillation: Secondary | ICD-10-CM | POA: Diagnosis not present

## 2014-09-19 DIAGNOSIS — Z794 Long term (current) use of insulin: Secondary | ICD-10-CM | POA: Diagnosis not present

## 2014-09-19 HISTORY — DX: Other chronic pain: G89.29

## 2014-09-19 HISTORY — DX: Type 2 diabetes mellitus without complications: E11.9

## 2014-09-19 HISTORY — DX: Chronic kidney disease, stage 3 unspecified: N18.30

## 2014-09-19 HISTORY — DX: Obstructive sleep apnea (adult) (pediatric): G47.33

## 2014-09-19 HISTORY — DX: Low back pain: M54.5

## 2014-09-19 HISTORY — DX: Chronic kidney disease, stage 3 (moderate): N18.3

## 2014-09-19 HISTORY — DX: Unspecified osteoarthritis, unspecified site: M19.90

## 2014-09-19 HISTORY — DX: Low back pain, unspecified: M54.50

## 2014-09-19 LAB — BASIC METABOLIC PANEL
Anion gap: 11 (ref 5–15)
BUN: 16 mg/dL (ref 6–23)
CHLORIDE: 98 mmol/L (ref 96–112)
CO2: 26 mmol/L (ref 19–32)
Calcium: 8.6 mg/dL (ref 8.4–10.5)
Creatinine, Ser: 1.54 mg/dL — ABNORMAL HIGH (ref 0.50–1.35)
GFR calc Af Amer: 54 mL/min — ABNORMAL LOW (ref >90)
GFR calc non Af Amer: 47 mL/min — ABNORMAL LOW (ref >90)
Glucose, Bld: 450 mg/dL — ABNORMAL HIGH (ref 70–99)
POTASSIUM: 4 mmol/L (ref 3.5–5.1)
Sodium: 135 mmol/L (ref 135–145)

## 2014-09-19 LAB — CBC WITH DIFFERENTIAL/PLATELET
BASOS ABS: 0 10*3/uL (ref 0.0–0.1)
BASOS PCT: 0 % (ref 0–1)
EOS PCT: 2 % (ref 0–5)
Eosinophils Absolute: 0.3 10*3/uL (ref 0.0–0.7)
HCT: 32.4 % — ABNORMAL LOW (ref 39.0–52.0)
Hemoglobin: 10.3 g/dL — ABNORMAL LOW (ref 13.0–17.0)
LYMPHS PCT: 12 % (ref 12–46)
Lymphs Abs: 1.5 10*3/uL (ref 0.7–4.0)
MCH: 23.5 pg — AB (ref 26.0–34.0)
MCHC: 31.8 g/dL (ref 30.0–36.0)
MCV: 73.8 fL — ABNORMAL LOW (ref 78.0–100.0)
MONO ABS: 0.9 10*3/uL (ref 0.1–1.0)
MONOS PCT: 7 % (ref 3–12)
Neutro Abs: 9.6 10*3/uL — ABNORMAL HIGH (ref 1.7–7.7)
Neutrophils Relative %: 79 % — ABNORMAL HIGH (ref 43–77)
Platelets: 262 10*3/uL (ref 150–400)
RBC: 4.39 MIL/uL (ref 4.22–5.81)
RDW: 17.2 % — ABNORMAL HIGH (ref 11.5–15.5)
WBC: 12.2 10*3/uL — ABNORMAL HIGH (ref 4.0–10.5)

## 2014-09-19 LAB — GLUCOSE, CAPILLARY
GLUCOSE-CAPILLARY: 423 mg/dL — AB (ref 70–99)
Glucose-Capillary: 451 mg/dL — ABNORMAL HIGH (ref 70–99)

## 2014-09-19 LAB — POCT INR: INR: 2.3

## 2014-09-19 LAB — BRAIN NATRIURETIC PEPTIDE: B NATRIURETIC PEPTIDE 5: 153.2 pg/mL — AB (ref 0.0–100.0)

## 2014-09-19 MED ORDER — INSULIN REGULAR HUMAN (CONC) 500 UNIT/ML ~~LOC~~ SOLN
70.0000 [IU] | Freq: Three times a day (TID) | SUBCUTANEOUS | Status: DC
  Filled 2014-09-19: qty 20

## 2014-09-19 MED ORDER — SODIUM CHLORIDE 0.9 % IV SOLN: 250.0000 mL | INTRAVENOUS | Status: DC | PRN

## 2014-09-19 MED ORDER — WARFARIN SODIUM 7.5 MG PO TABS: 7.5000 mg | ORAL_TABLET | ORAL | Status: DC

## 2014-09-19 MED ORDER — OXYCODONE HCL 5 MG PO TABS
30.0000 mg | ORAL_TABLET | Freq: Four times a day (QID) | ORAL | Status: DC
  Administered 2014-09-19: 30 mg via ORAL
  Filled 2014-09-19: qty 6

## 2014-09-19 MED ORDER — POTASSIUM CHLORIDE CRYS ER 20 MEQ PO TBCR
20.0000 meq | EXTENDED_RELEASE_TABLET | Freq: Every day | ORAL | Status: DC
  Administered 2014-09-19 – 2014-09-24 (×6): 20 meq via ORAL
  Filled 2014-09-19 (×7): qty 1

## 2014-09-19 MED ORDER — POTASSIUM CHLORIDE CRYS ER 20 MEQ PO TBCR: 20.0000 meq | EXTENDED_RELEASE_TABLET | Freq: Every day | ORAL | Status: DC

## 2014-09-19 MED ORDER — ACETAMINOPHEN 325 MG PO TABS: 650.0000 mg | ORAL_TABLET | ORAL | Status: DC | PRN

## 2014-09-19 MED ORDER — INSULIN ASPART 100 UNIT/ML ~~LOC~~ SOLN
0.0000 [IU] | Freq: Three times a day (TID) | SUBCUTANEOUS | Status: DC
  Administered 2014-09-19: 20 [IU] via SUBCUTANEOUS

## 2014-09-19 MED ORDER — ROSUVASTATIN CALCIUM 40 MG PO TABS
40.0000 mg | ORAL_TABLET | Freq: Every day | ORAL | Status: DC
  Administered 2014-09-19 – 2014-09-23 (×5): 40 mg via ORAL
  Filled 2014-09-19 (×6): qty 1

## 2014-09-19 MED ORDER — GABAPENTIN 400 MG PO CAPS
1200.0000 mg | ORAL_CAPSULE | Freq: Every day | ORAL | Status: DC
  Filled 2014-09-19: qty 3

## 2014-09-19 MED ORDER — WARFARIN - PHARMACIST DOSING INPATIENT
Freq: Every day | Status: DC
  Administered 2014-09-20 – 2014-09-21 (×2)

## 2014-09-19 MED ORDER — METOPROLOL TARTRATE 12.5 MG HALF TABLET
12.5000 mg | ORAL_TABLET | Freq: Two times a day (BID) | ORAL | Status: DC
  Administered 2014-09-19 – 2014-09-20 (×2): 12.5 mg via ORAL
  Filled 2014-09-19 (×3): qty 1

## 2014-09-19 MED ORDER — OXYCODONE HCL 5 MG PO TABS: 30.0000 mg | ORAL_TABLET | Freq: Four times a day (QID) | ORAL | Status: DC

## 2014-09-19 MED ORDER — GABAPENTIN 400 MG PO CAPS
1200.0000 mg | ORAL_CAPSULE | Freq: Every day | ORAL | Status: DC
  Administered 2014-09-19 – 2014-09-24 (×6): 1200 mg via ORAL
  Filled 2014-09-19 (×6): qty 3

## 2014-09-19 MED ORDER — INSULIN ASPART 100 UNIT/ML ~~LOC~~ SOLN
50.0000 [IU] | Freq: Once | SUBCUTANEOUS | Status: AC
  Administered 2014-09-19: 50 [IU] via SUBCUTANEOUS

## 2014-09-19 MED ORDER — WARFARIN SODIUM 5 MG PO TABS
5.0000 mg | ORAL_TABLET | ORAL | Status: DC
  Filled 2014-09-19: qty 1

## 2014-09-19 MED ORDER — SPIRONOLACTONE 25 MG PO TABS
25.0000 mg | ORAL_TABLET | Freq: Every day | ORAL | Status: DC
  Administered 2014-09-19 – 2014-09-24 (×6): 25 mg via ORAL
  Filled 2014-09-19 (×6): qty 1

## 2014-09-19 MED ORDER — SODIUM CHLORIDE 0.9 % IJ SOLN
3.0000 mL | Freq: Two times a day (BID) | INTRAMUSCULAR | Status: DC
  Administered 2014-09-20: 3 mL via INTRAVENOUS

## 2014-09-19 MED ORDER — ONDANSETRON HCL 4 MG/2ML IJ SOLN: 4.0000 mg | Freq: Four times a day (QID) | INTRAMUSCULAR | Status: DC | PRN

## 2014-09-19 MED ORDER — DILTIAZEM HCL 100 MG IV SOLR
5.0000 mg/h | INTRAVENOUS | Status: DC
  Administered 2014-09-19: 5 mg/h via INTRAVENOUS
  Filled 2014-09-19 (×3): qty 100

## 2014-09-19 MED ORDER — OXYCODONE HCL 5 MG PO TABS
30.0000 mg | ORAL_TABLET | Freq: Four times a day (QID) | ORAL | Status: DC
  Administered 2014-09-19 – 2014-09-21 (×9): 30 mg via ORAL
  Filled 2014-09-19 (×9): qty 6

## 2014-09-19 MED ORDER — SODIUM CHLORIDE 0.9 % IJ SOLN: 3.0000 mL | INTRAMUSCULAR | Status: DC | PRN

## 2014-09-19 MED ORDER — SPIRONOLACTONE 25 MG PO TABS: 25.0000 mg | ORAL_TABLET | Freq: Every day | ORAL | Status: DC

## 2014-09-19 MED ORDER — IRON 325 (65 FE) MG PO TABS: 1.0000 | ORAL_TABLET | Freq: Every day | ORAL | Status: DC

## 2014-09-19 MED ORDER — WARFARIN SODIUM 10 MG PO TABS
10.0000 mg | ORAL_TABLET | Freq: Once | ORAL | Status: AC
  Administered 2014-09-19: 10 mg via ORAL
  Filled 2014-09-19: qty 1

## 2014-09-19 MED ORDER — INSULIN REGULAR HUMAN (CONC) 500 UNIT/ML ~~LOC~~ SOLN
70.0000 [IU] | Freq: Three times a day (TID) | SUBCUTANEOUS | Status: DC
  Administered 2014-09-20 – 2014-09-22 (×9): 70 [IU] via SUBCUTANEOUS
  Filled 2014-09-19 (×8): qty 20

## 2014-09-19 MED ORDER — FUROSEMIDE 10 MG/ML IJ SOLN
80.0000 mg | Freq: Two times a day (BID) | INTRAMUSCULAR | Status: DC
  Administered 2014-09-19 – 2014-09-23 (×9): 80 mg via INTRAVENOUS
  Filled 2014-09-19 (×14): qty 8

## 2014-09-19 MED ORDER — ROSUVASTATIN CALCIUM 40 MG PO TABS: 40.0000 mg | ORAL_TABLET | Freq: Every day | ORAL | Status: DC

## 2014-09-19 NOTE — Assessment & Plan Note (Signed)
Will be admitted to [facility] directly from office. He will be started on IV Lasix 80 mg twice daily. Check a basic metabolic panel really, CBC, chest x-ray, BNP. Continue supplemental potassium.

## 2014-09-19 NOTE — Assessment & Plan Note (Signed)
Continue 3 L per nasal cannula.  O2 saturations were 94-95% on that dose

## 2014-09-19 NOTE — Assessment & Plan Note (Signed)
We'll start on sliding scale insulin. Will use his Humulin if he can be brought in from home.

## 2014-09-19 NOTE — Progress Notes (Signed)
Patient ID: [name], male   DOB: 02-May-1951, 64 y.o.   MRN: [id number]    Date:  09/19/2014   ID:  [name], DOB October 08, 1950, MRN [id number]  PCP:  [name], MD  Primary Cardiologist:  [name]  Complaint: Shortness of breath   History of Present Illness: [name] is a 64 y.o. morbidly obese male with a history of AF and CAD. He has been previously cardioverted back in 2010. He has had PCI of the RCA in 2012 with DES. Last EF normal in June of 2015. EF 60-65% with grade 1 diastolic dysfunction. Small oscillating density on the aortic valve could not be ruled out. Myoview from June of 2015 with partially reversible apical lateral defect. Other issues include DM, CKD, deconditioning and OSA.   He underwent cardiac cathed on 7/23/thousand 15. Elevated pressures with mild to moderate disease noted. 75 cc contrast given. Has had worsening kidney function since then. He was hospitalized February 11, 2014 due to CHF He was initially given IV lasix 60mg  BID then increased to 80 BID. This resulted in net fluid loss of 4.0L. He was transitioned back to oral lasix 80mg  BID. He uses Humulin insulin at home. A1C is 9.6. He refused to wear the CPAP and does not wear it at home.  Presented today for his INR check. 3 weeks ago it was 1.1 today 2.3. He was noted to be tachypneic, tachycardic, and hypoxic when checked with O2 sat. He was only put on 3 L of O2 by nasal cannula and placed on my schedule.  Patient's weight is up 19 pounds since last office visit in September. He reports developing acute shortness of breath approximately 3 weeks ago with wheezing. He always sleeps in a recliner. He has chronic back pain and about a week ago broke off a tooth down to the gumline. EKG reveals atrial fibrillation with rapid ventricular response with a rate of 109 bpm.  The patient currently denies nausea, vomiting, fever, chest pain, dizziness, PND, cough, congestion, abdominal pain, hematochezia,  melena,   Wt Readings from Last 3 Encounters:  09/19/14 408 lb (185.068 kg)  03/22/14 389 lb 8 oz (176.676 kg)  02/14/14 393 lb (178.264 kg)     Past Medical History  Diagnosis Date  . Diabetes mellitus   . Hypertension   . Rheumatoid arthritis(714.0)   . Diastolic congestive heart failure   . History of MI (myocardial infarction)     SILENT MI IN PAST   . CAD (coronary artery disease)   . Atrial fibrillation   . Anemia   . Myocardial infarction     pt stated he had a slient heart attack that showed up on nuclear stress.  . Sleep apnea     C-pap intolerant  . Pilonidal cyst 01/25/2013  . Shortness of breath     with ambulation, with exertion  . Chronic kidney disease     CKD stage 3  . Neuropathy   . History of blood transfusion   . Carpal tunnel syndrome, bilateral   . Morbid obesity     No current outpatient prescriptions on file.   No current facility-administered medications for this visit.    Allergies:   No Known Allergies  Social History:  The patient  reports that he quit smoking about 31 years ago. His smoking use included Cigarettes. He has a 20 pack-year smoking history. He has never used smokeless tobacco. He reports that he does not drink alcohol or use  illicit drugs.   Family history:   Family History  Problem Relation Age of Onset  . Other Other     PT ADOPTED    ROS:  Please see the history of present illness.  All other systems reviewed and negative.   PHYSICAL EXAM: VS:  BP 132/78 mmHg  Pulse 107  Ht 6' (1.829 m)  Wt 408 lb (185.068 kg)  BMI 55.32 kg/m2  SpO2 93% Morbidly obese, well developed, in respiratory distress  HEENT: Pupils are equal round react to light accommodation extraocular movements are intact.  Neck: JVD unknown-very thick neckNo cervical lymphadenopathy. Cardiac: Given the rate and rhythm without murmurs rubs or gallops. Lungs:  clear to auscultation bilaterally, no wheezing, rhonchi or rales Abd: Large, soft,  nontender, positive bowel sounds all quadrants,  Ext: Plus tense lower extremity edema.  2+ radial  pulses. Skin: warm and dry Neuro:  Grossly normal  EKG:  Atrial fibrillation with rapid ventricular response 109 bpm     ASSESSMENT AND PLAN:  Problem List Items Addressed This Visit    OBESITY-MORBID BMI 54 (Chronic)   Essential hypertension, benign (Chronic)    Monitor blood pressure. Will add Cardizem for rate control      Relevant Medications   amLODipine (NORVASC) 10 MG tablet   Diabetes mellitus type 2, uncontrolled, with complications (Chronic)    We'll start on sliding scale insulin. Will use his Humulin if he can be brought in from home.      CKD (chronic kidney disease) stage 3, GFR 30-59 ml/min   Atrial fibrillation with rapid ventricular response    Start IV Cardizem 5 mg per hour. No bolus.  If still in atrial fibrillation on Monday and plan TEE cardioversion. Therapeutic on his INR today however 3 weeks ago it was 1.1.  He has a history of sleep apnea and is noncompliant with CPAP.      Relevant Medications   amLODipine (NORVASC) 10 MG tablet   Acute respiratory failure    Continue 3 L per nasal cannula.  O2 saturations were 94-95% on that dose      Acute on chronic diastolic heart failure    Will be admitted to [facility] directly from office. He will be started on IV Lasix 80 mg twice daily. Check a basic metabolic panel really, CBC, chest x-ray, BNP. Continue supplemental potassium.      Relevant Medications   amLODipine (NORVASC) 10 MG tablet    Other Visit Diagnoses    Atrial fibrillation with RVR    -  Primary    Relevant Medications    amLODipine (NORVASC) 10 MG tablet    Other Relevant Orders    EKG 12-Lead

## 2014-09-19 NOTE — Progress Notes (Signed)
ANTICOAGULATION CONSULT NOTE - Initial Consult  Pharmacy Consult for coumadin Indication: atrial fibrillation  No Known Allergies   Vital Signs: BP: 132/78 mmHg (03/10 1520) Pulse Rate: 107 (03/10 1520)  Labs:  Recent Labs  09/19/14 1452  INR 2.3    CrCl cannot be calculated (Patient has no serum creatinine result on file.).   Medical History: Past Medical History  Diagnosis Date  . Diabetes mellitus   . Hypertension   . Rheumatoid arthritis(714.0)   . Diastolic congestive heart failure   . History of MI (myocardial infarction)     SILENT MI IN PAST   . CAD (coronary artery disease)   . Atrial fibrillation   . Anemia   . Myocardial infarction     pt stated he had a slient heart attack that showed up on nuclear stress.  . Sleep apnea     C-pap intolerant  . Pilonidal cyst 01/25/2013  . Shortness of breath     with ambulation, with exertion  . Chronic kidney disease     CKD stage 3  . Neuropathy   . History of blood transfusion   . Carpal tunnel syndrome, bilateral   . Morbid obesity     Medications:  Prescriptions prior to admission  Medication Sig Dispense Refill Last Dose  . amLODipine (NORVASC) 10 MG tablet Take 10 mg by mouth daily.   09/18/2014 at Unknown time  . Ferrous Sulfate (IRON) 325 (65 FE) MG TABS Take 1 tablet by mouth daily.   09/18/2014 at Unknown time  . furosemide (LASIX) 80 MG tablet Take 1 tablet (80 mg total) by mouth 2 (two) times daily. 60 tablet 5 09/18/2014 at Unknown time  . gabapentin (NEURONTIN) 300 MG capsule Take 1,200 mg by mouth 2 (two) times daily.    09/18/2014 at Unknown time  . Hypromellose (NATURAL BALANCE TEARS OP) Place 1 drop into both eyes daily as needed.   09/18/2014  . insulin regular human CONCENTRATED (HUMULIN R) 500 UNIT/ML SOLN injection Inject 120 Units into the skin 3 (three) times daily with meals.    09/18/2014 at Unknown time  . metoprolol tartrate (LOPRESSOR) 25 MG tablet Take 0.5 tablets (12.5 mg total) by mouth 2  (two) times daily. (Patient taking differently: Take 25 mg by mouth at bedtime. ) 60 tablet 5 09/18/2014 at 2000  . Multiple Vitamin (MULTIVITAMIN WITH MINERALS) TABS Take 1 tablet by mouth daily.   09/18/2014 at Unknown time  . oxycodone (ROXICODONE) 30 MG immediate release tablet Take 30 mg by mouth 4 (four) times daily.    09/18/2014 at Unknown time  . Podiatric Products (DIADERM FOOT REJUVENATING) CREA Apply 1 application topically daily as needed (dry skin).   Past Week at Unknown time  . potassium chloride SA (KLOR-CON M20) 20 MEQ tablet Take 1 tablet (20 mEq total) by mouth daily. 30 tablet 0 09/18/2014 at Unknown time  . rosuvastatin (CRESTOR) 40 MG tablet Take 40 mg by mouth daily.   09/18/2014 at Unknown time  . spironolactone (ALDACTONE) 25 MG tablet Take 1 tablet (25 mg total) by mouth daily. 30 tablet 2 09/18/2014 at Unknown time  . warfarin (COUMADIN) 5 MG tablet Take as directed by coumadin clinic (Patient taking differently: Take 5 mg by mouth. 5 mg on Mon, Wed, Fri, Sat Sun, and 7.5 mg on Tues and Thurs) 45 tablet 0 09/18/2014 at Unknown time  . ACCU-CHEK AVIVA PLUS test strip   0   . ACCU-CHEK FASTCLIX LANCETS MISC  Taking  . BD INSULIN SYRINGE ULTRAFINE 31G X 15/64" 0.3 ML MISC   0   . TRULICITY 0.75 MG/0.5ML SOPN Take 1 tablet by mouth once a week.   09/16/2014   Scheduled:  . furosemide  80 mg Intravenous BID  . [START ON 09/20/2014] gabapentin  1,200 mg Oral Daily  . [START ON 09/20/2014] insulin aspart  0-20 Units Subcutaneous TID WC  . [START ON 09/20/2014] insulin regular human CONCENTRATED  90 Units Subcutaneous TID WC  . metoprolol tartrate  12.5 mg Oral BID  . oxyCODONE  30 mg Oral QID  . [START ON 09/20/2014] potassium chloride SA  20 mEq Oral Daily  . [START ON 09/20/2014] rosuvastatin  40 mg Oral q1800  . sodium chloride  3 mL Intravenous Q12H  . [START ON 09/20/2014] spironolactone  25 mg Oral Daily    Assessment: 64 yo male here with SOB. He is on coumadin at home for afib and  pharmacy has been consulted to dose. INR today in clinic was 2.3.  Of note he missed his dose on 09/17/13 and plans per clinic were 10mg  po today then resume home regimen.  Home coumadin regimen: 5mg /day except 7.5mg  on TuTh  Goal of Therapy:  INR 2-3 Monitor platelets by anticoagulation protocol: Yes   Plan:  -Will continue with outpatient plan of 10mg  coumadin today then resume home regimen -Daily PT/INR for now  , Pharm D 09/19/2014 6:50 PM

## 2014-09-19 NOTE — H&P (Signed)
Date: 09/19/2014   ID: [name], DOB 08-16-1950, MRN [id number]  PCP: [name], MD Primary Cardiologist: [name]  Complaint: Shortness of breath  History of Present Illness: [name] is a 64 y.o. morbidly obese male with a history of AF and CAD. He has been previously cardioverted back in 2010. He has had PCI of the RCA in 2012 with DES. Last EF normal in June of 2015. EF 60-65% with grade 1 diastolic dysfunction. Small oscillating density on the aortic valve could not be ruled out. Myoview from June of 2015 with partially reversible apical lateral defect. Other issues include DM, CKD, deconditioning and OSA.   He underwent cardiac cathed on 7/23/thousand 15. Elevated pressures with mild to moderate disease noted. 75 cc contrast given. Has had worsening kidney function since then. He was hospitalized February 11, 2014 due to CHF He was initially given IV lasix 60mg  BID then increased to 80 BID. This resulted in net fluid loss of 4.0L. He was transitioned back to oral lasix 80mg  BID. He uses Humulin insulin at home. A1C is 9.6. He refused to wear the CPAP and does not wear it at home.  Presented today for his INR check. 3 weeks ago it was 1.1 today 2.3. He was noted to be tachypneic, tachycardic, and hypoxic when checked with O2 sat. He was only put on 3 L of O2 by nasal cannula and placed on my schedule. Patient's weight is up 19 pounds since last office visit in September. He reports developing acute shortness of breath approximately 3 weeks ago with wheezing. He always sleeps in a recliner. He has chronic back pain and about a week ago broke off a tooth down to the gumline. EKG reveals atrial fibrillation with rapid ventricular response with a rate of 109 bpm.  The patient currently denies nausea, vomiting, fever, chest pain, dizziness, PND, cough, congestion, abdominal pain, hematochezia, melena,   Wt Readings from Last 3 Encounters:  09/19/14 408 lb  (185.068 kg)  03/22/14 389 lb 8 oz (176.676 kg)  02/14/14 393 lb (178.264 kg)     Past Medical History  Diagnosis Date  . Diabetes mellitus   . Hypertension   . Rheumatoid arthritis(714.0)   . Diastolic congestive heart failure   . History of MI (myocardial infarction)     SILENT MI IN PAST   . CAD (coronary artery disease)   . Atrial fibrillation   . Anemia   . Myocardial infarction     pt stated he had a slient heart attack that showed up on nuclear stress.  . Sleep apnea     C-pap intolerant  . Pilonidal cyst 01/25/2013  . Shortness of breath     with ambulation, with exertion  . Chronic kidney disease     CKD stage 3  . Neuropathy   . History of blood transfusion   . Carpal tunnel syndrome, bilateral   . Morbid obesity     No current outpatient prescriptions on file.   No current facility-administered medications for this visit.    Allergies: No Known Allergies  Social History: The patient  reports that he quit smoking about 31 years ago. His smoking use included Cigarettes. He has a 20 pack-year smoking history. He has never used smokeless tobacco. He reports that he does not drink alcohol or use illicit drugs.   Family history:  Family History  Problem Relation Age of Onset  . Other Other     PT ADOPTED  ROS: Please see the history of present illness. All other systems reviewed and negative.   PHYSICAL EXAM: VS: BP 132/78 mmHg  Pulse 107  Ht 6' (1.829 m)  Wt 408 lb (185.068 kg)  BMI 55.32 kg/m2  SpO2 93% Morbidly obese, well developed, in mild respiratory distress with ambulation.  Not tachypneic at rest.  HEENT: Pupils are equal round react to light accommodation extraocular movements are intact.  Neck: JVD unknown-very thick neck. No cervical lymphadenopathy. Cardiac: irregular rate and rhythm without murmurs rubs or gallops.  Lungs: clear to  auscultation bilaterally although BS are decreased throughout, no wheezing, rhonchi or rales  Abd: Large, soft, nontender, positive bowel sounds all quadrants,  Ext: 2+  tense lower extremity edema. 2+ radial pulses. Skin: warm and dry  Neuro: Grossly normal  EKG: Atrial fibrillation with rapid ventricular response 109 bpm   ASSESSMENT AND PLAN:  Problem List Items Addressed This Visit    OBESITY-MORBID BMI 54 (Chronic)   Essential hypertension, benign (Chronic)    Monitor blood pressure. Will add Cardizem for rate control      Relevant Medications   amLODipine (NORVASC) 10 MG tablet   Diabetes mellitus type 2, uncontrolled, with complications (Chronic)    We'll start on sliding scale insulin. Will use his Humulin if he can be brought in from home.      CKD (chronic kidney disease) stage 3, GFR 30-59 ml/min   Atrial fibrillation with rapid ventricular response    Start IV Cardizem 5 mg per hour. No bolus. If still in atrial fibrillation on Monday and plan TEE cardioversion. Therapeutic on his INR today however 3 weeks ago it was 1.1. He has a history of sleep apnea and is noncompliant with CPAP.      Relevant Medications   amLODipine (NORVASC) 10 MG tablet   Acute respiratory failure    Continue 3 L per nasal cannula. O2 saturations were 94-95% on that dose      Acute on chronic diastolic heart failure    Will be admitted to [facility] directly from office. He will be started on IV Lasix 80 mg twice daily. Check a basic metabolic panel really, CBC, chest x-ray, BNP. Continue supplemental potassium.      Relevant Medications   amLODipine (NORVASC) 10 MG tablet    Other Visit Diagnoses    Atrial fibrillation with RVR - Primary    Relevant Medications    amLODipine (NORVASC) 10 MG tablet    Other Relevant Orders    EKG 12-Lead

## 2014-09-19 NOTE — Assessment & Plan Note (Signed)
Start IV Cardizem 5 mg per hour. No bolus.  If still in atrial fibrillation on Monday and plan TEE cardioversion. Therapeutic on his INR today however 3 weeks ago it was 1.1.  He has a history of sleep apnea and is noncompliant with CPAP.

## 2014-09-19 NOTE — Assessment & Plan Note (Signed)
Monitor blood pressure. Will add Cardizem for rate control

## 2014-09-19 NOTE — Patient Instructions (Signed)
You are being admitted today to [facility]  Room 9

## 2014-09-20 ENCOUNTER — Inpatient Hospital Stay (HOSPITAL_COMMUNITY): Payer: Medicare Other

## 2014-09-20 DIAGNOSIS — I4891 Unspecified atrial fibrillation: Secondary | ICD-10-CM

## 2014-09-20 DIAGNOSIS — I5033 Acute on chronic diastolic (congestive) heart failure: Secondary | ICD-10-CM

## 2014-09-20 LAB — BASIC METABOLIC PANEL
Anion gap: 11 (ref 5–15)
BUN: 17 mg/dL (ref 6–23)
CHLORIDE: 94 mmol/L — AB (ref 96–112)
CO2: 32 mmol/L (ref 19–32)
CREATININE: 1.47 mg/dL — AB (ref 0.50–1.35)
Calcium: 8.6 mg/dL (ref 8.4–10.5)
GFR calc Af Amer: 57 mL/min — ABNORMAL LOW (ref >90)
GFR calc non Af Amer: 49 mL/min — ABNORMAL LOW (ref >90)
Glucose, Bld: 234 mg/dL — ABNORMAL HIGH (ref 70–99)
POTASSIUM: 4.2 mmol/L (ref 3.5–5.1)
SODIUM: 137 mmol/L (ref 135–145)

## 2014-09-20 LAB — GLUCOSE, CAPILLARY
GLUCOSE-CAPILLARY: 260 mg/dL — AB (ref 70–99)
GLUCOSE-CAPILLARY: 352 mg/dL — AB (ref 70–99)
GLUCOSE-CAPILLARY: 311 mg/dL — AB (ref 70–99)
GLUCOSE-CAPILLARY: 274 mg/dL — AB (ref 70–99)

## 2014-09-20 LAB — PROTIME-INR
INR: 1.94 — ABNORMAL HIGH (ref 0.00–1.49)
Prothrombin Time: 22.3 seconds — ABNORMAL HIGH (ref 11.6–15.2)

## 2014-09-20 MED ORDER — WARFARIN SODIUM 2.5 MG PO TABS
12.5000 mg | ORAL_TABLET | Freq: Once | ORAL | Status: AC
  Administered 2014-09-20: 12.5 mg via ORAL
  Filled 2014-09-20: qty 1

## 2014-09-20 MED ORDER — METOPROLOL TARTRATE 12.5 MG HALF TABLET
12.5000 mg | ORAL_TABLET | Freq: Once | ORAL | Status: AC
  Administered 2014-09-20: 12.5 mg via ORAL
  Filled 2014-09-20: qty 1

## 2014-09-20 MED ORDER — SODIUM CHLORIDE 0.9 % IV SOLN: 250.0000 mL | INTRAVENOUS | Status: DC

## 2014-09-20 MED ORDER — WARFARIN SODIUM 5 MG PO TABS: 5.0000 mg | ORAL_TABLET | ORAL | Status: DC

## 2014-09-20 MED ORDER — METOPROLOL TARTRATE 25 MG PO TABS
25.0000 mg | ORAL_TABLET | Freq: Two times a day (BID) | ORAL | Status: DC
  Administered 2014-09-20 – 2014-09-24 (×8): 25 mg via ORAL
  Filled 2014-09-20 (×9): qty 1

## 2014-09-20 MED ORDER — SODIUM CHLORIDE 0.9 % IJ SOLN
3.0000 mL | Freq: Two times a day (BID) | INTRAMUSCULAR | Status: DC
  Administered 2014-09-20 – 2014-09-24 (×8): 3 mL via INTRAVENOUS

## 2014-09-20 MED ORDER — SODIUM CHLORIDE 0.9 % IJ SOLN: 3.0000 mL | INTRAMUSCULAR | Status: DC | PRN

## 2014-09-20 NOTE — Progress Notes (Signed)
On board for TEE cardioversion on Monday 1300. Orders placed. Per Dr. Eden Emms, anesthesia has to be present given his weight 396 lbs.   Ramond Dial PA Pager: [phone]

## 2014-09-20 NOTE — Progress Notes (Signed)
ANTICOAGULATION CONSULT NOTE  Pharmacy Consult for coumadin Indication: atrial fibrillation  No Known Allergies   Vital Signs: Temp: 97.7 F (36.5 C) (03/11 0936) Temp Source: Oral (03/11 0936) BP: 127/70 mmHg (03/11 0936) Pulse Rate: 93 (03/11 0936)  Labs:  Recent Labs  09/19/14 1452 09/19/14 1938 09/20/14 0447  HGB  --  10.3*  --   HCT  --  32.4*  --   PLT  --  262  --   LABPROT  --   --  22.3*  INR 2.3  --  1.94*  CREATININE  --  1.54* 1.47*    Estimated Creatinine Clearance: 87.3 mL/min (by C-G formula based on Cr of 1.47).  Assessment: 64 yo male here with SOB. He is on coumadin at home for afib and pharmacy has been consulted to dose. INR 3/10 in clinic was 2.3, now down to 1.9 this morning.  Of note he missed his dose on 09/17/13. Will give extra dose again today. No bleeding noted.  Home coumadin regimen: 5mg /day except 7.5mg  on TuTh  Goal of Therapy:  INR 2-3 Monitor platelets by anticoagulation protocol: Yes   Plan:  -Warfarin 12.5mg  tonight x1 -Daily PT/INR for now  12-24-1995 PharmD., BCPS Clinical Pharmacist Pager [phone] 09/20/2014 10:58 AM

## 2014-09-20 NOTE — Progress Notes (Signed)
Patient Name: [name] Date of Encounter: 09/20/2014  Primary Cardiologist: Dr. [name]   Principal Problem:   Acute on chronic diastolic heart failure Active Problems:   Diabetes mellitus type 2, uncontrolled, with complications   Hyperlipidemia   OBESITY-MORBID BMI 54   Essential hypertension, benign   CELLULITIS AND ABSCESS OF LEG EXCEPT FOOT   Obstructive sleep apnea- unable to tolerate c-pap   Coronary Artery Disease   Chronic anticoagulation- Coumadin   Atrial fibrillation with RVR    SUBJECTIVE  Breathing better today. No CP.   CURRENT MEDS . furosemide  80 mg Intravenous BID  . gabapentin  1,200 mg Oral Daily  . insulin regular human CONCENTRATED  70 Units Subcutaneous TID WC  . metoprolol tartrate  12.5 mg Oral BID  . oxyCODONE  30 mg Oral QID  . potassium chloride SA  20 mEq Oral Daily  . rosuvastatin  40 mg Oral q1800  . sodium chloride  3 mL Intravenous Q12H  . spironolactone  25 mg Oral Daily  . warfarin  5 mg Oral Once per day on Sun Mon Wed Fri Sat  . [START ON 09/24/2014] warfarin  7.5 mg Oral Once per day on Tue Thu  . Warfarin - Pharmacist Dosing Inpatient   Does not apply q1800    OBJECTIVE  Filed Vitals:   09/19/14 1956 09/20/14 0259 09/20/14 0518 09/20/14 0936  BP: 128/67 142/77 123/84 127/70  Pulse: 105 94 85 93  Temp: 97.9 F (36.6 C) 98.1 F (36.7 C) 97.9 F (36.6 C) 97.7 F (36.5 C)  TempSrc: Oral Oral Oral Oral  Resp: 22 20 19 18   Height:      Weight:   396 lb 6.4 oz (179.806 kg)   SpO2: 95% 100% 99% 99%    Intake/Output Summary (Last 24 hours) at 09/20/14 1019 Last data filed at 09/20/14 0957  Gross per 24 hour  Intake 1089.33 ml  Output   2700 ml  Net -1610.67 ml   Filed Weights   09/19/14 1859 09/20/14 0518  Weight: 407 lb 6.6 oz (184.8 kg) 396 lb 6.4 oz (179.806 kg)    PHYSICAL EXAM  General: Pleasant, NAD. Neuro: Alert and oriented X 3. Moves all extremities spontaneously. Psych: Normal affect. HEENT:   Normal  Neck: Supple without bruits  +JVD. Lungs:  Resp regular and unlabored, CTA. Heart: Irregular. no s3, s4, or murmurs. Abdomen: Soft, non-tender, non-distended, BS + x 4.  Extremities: No clubbing, cyanosis or edema. DP/PT/Radials 2+ and equal bilaterally. Chronic cellulitis of LE  Accessory Clinical Findings  CBC  Recent Labs  09/19/14 1938  WBC 12.2*  NEUTROABS 9.6*  HGB 10.3*  HCT 32.4*  MCV 73.8*  PLT 262   Basic Metabolic Panel  Recent Labs  09/19/14 1938 09/20/14 0447  NA 135 137  K 4.0 4.2  CL 98 94*  CO2 26 32  GLUCOSE 450* 234*  BUN 16 17  CREATININE 1.54* 1.47*  CALCIUM 8.6 8.6    TELE A-fib with HR 80-90s, occasionally increase to 130s    ECG  No new EKG  Echocardiogram  LV EF: 60% -  65%  ------------------------------------------------------------------- Indications:   CAD of native vessels 414.01. Shortness of breath 786.05.  ------------------------------------------------------------------- History:  PMH: CAD. Atrial fibrillation. Obstructive Sleep Apnea. Chronic Kidney Disease. Shortness of Breath. MI. Risk factors: Hypertension. Diabetes mellitus. Morbidly obese. Dyslipidemia.  ------------------------------------------------------------------- Study Conclusions  - Left ventricle: The cavity size was normal. Wall thickness was increased in a pattern  of mild LVH. Systolic function was normal. The estimated ejection fraction was in the range of 60% to 65%. Wall motion was normal; there were no regional wall motion abnormalities. Doppler parameters are consistent with abnormal left ventricular relaxation (grade 1 diastolic dysfunction). - Left atrium: The atrium was mildly dilated.  Impressions:  - Technically difficult; definity used; Normal LV function; mild LVH; grade 1 diastolic dysfunction; cannot R/O small oscillating density on aortic valve; consider TEE if clinically indicated.     Radiology/Studies  Dg Chest 2 View  09/20/2014   CLINICAL DATA:  Short of breath  EXAM: CHEST  2 VIEW  COMPARISON:  02/12/2014  FINDINGS: Cardiac enlargement without heart failure. Lung volume is normal. Prominent lung markings bilaterally are unchanged and likely represent chronic lung disease. Probable interstitial scarring. Negative for pneumonia or effusion.  IMPRESSION: Chronic lung disease is stable.  No superimposed acute abnormality.   Electronically Signed   By: Marlan Palau M.D.   On: 09/20/2014 08:18    ASSESSMENT AND PLAN  1. Paroxysmal A-fib with RVR   - HR better controlled on IV diltiazem. Consider D/C IV diltiazem, increase metoprolol to 25mg  BID  - CHA2DS2- VASC score 3 (HTN, DM, HF)  - potential TEE/DCCV on Monday if remain a-fib  2. Acute on chronic diastolic HF  - likely occurred in the setting of rapid a-fib and also run out of lasix for several days  - Echo 12/2013 EF 60-65%, no RWMA, grade 1 diastolic dysfunction. Cannot r/o small oscillating density in aortic valve? (did not see TEE for followup)  - pending echo. Lungs clear, expect transition to 80mg  BID PO lasix tomorrow.   3. Acute respiratory distress - improved 4. CAD  - PCI of the RCA in 2012 with DES 5. DM 6. HTN 7. Morbid obesity  Signed, PA-C Pager: [phone]  Patient examined chart reviewed.  He is on disability / fixed income and cannot afford NOAC Still in afib  Will arrange TEE/DCC Monday  Increase metoprolol to 25 bid.  Anesthesia will Be present for TEE/DCC as managing his airway with morbid obesity will be difficult Would give 12.5 mg coumadin today as INR only 1.96    [phone]

## 2014-09-20 NOTE — Discharge Instructions (Signed)
Information on my medicine - Coumadin®   (Warfarin) °Why was Coumadin prescribed for you? °Coumadin was prescribed for you because you have a blood clot or a medical condition that can cause an increased risk of forming blood clots. Blood clots can cause serious health problems by blocking the flow of blood to the heart, lung, or brain. Coumadin can prevent harmful blood clots from forming. °As a reminder your indication for Coumadin is:   Stroke Prevention Because Of Atrial Fibrillation ° °What test will check on my response to Coumadin? °While on Coumadin (warfarin) you will need to have an INR test regularly to ensure that your dose is keeping you in the desired range. The INR (international normalized ratio) number is calculated from the result of the laboratory test called prothrombin time (PT). ° °If an INR APPOINTMENT HAS NOT ALREADY BEEN MADE FOR YOU please schedule an appointment to have this lab work done by your health care provider within 7 days. °Your INR goal is a number between:  2 to 3 ° °What  do you need to  know  About  COUMADIN? °Take Coumadin (warfarin) exactly as prescribed by your healthcare provider about the same time each day.  DO NOT stop taking without talking to the doctor who prescribed the medication.  Stopping without other blood clot prevention medication to take the place of Coumadin may increase your risk of developing a new clot or stroke.  Get refills before you run out. ° °What do you do if you miss a dose? °If you miss a dose, take it as soon as you remember on the same day then continue your regularly scheduled regimen the next day.  Do not take two doses of Coumadin at the same time. ° °Important Safety Information °A possible side effect of Coumadin (Warfarin) is an increased risk of bleeding. You should call your healthcare provider right away if you experience any of the following: °? Bleeding from an injury or your nose that does not stop. °? Unusual colored urine (red or  dark brown) or unusual colored stools (red or black). °? Unusual bruising for unknown reasons. °? A serious fall or if you hit your head (even if there is no bleeding). ° °Some foods or medicines interact with Coumadin® (warfarin) and might alter your response to warfarin. To help avoid this: °? Eat a balanced diet, maintaining a consistent amount of Vitamin K. °? Notify your provider about major diet changes you plan to make. °? Avoid alcohol or limit your intake to 1 drink for women and 2 drinks for men per day. °(1 drink is 5 oz. wine, 12 oz. beer, or 1.5 oz. liquor.) ° °Make sure that ANY health care provider who prescribes medication for you knows that you are taking Coumadin (warfarin).  Also make sure the healthcare provider who is monitoring your Coumadin knows when you have started a new medication including herbals and non-prescription products. ° °Coumadin® (Warfarin)  Major Drug Interactions  °Increased Warfarin Effect Decreased Warfarin Effect  °Alcohol (large quantities) °Antibiotics (esp. Septra/Bactrim, Flagyl, Cipro) °Amiodarone (Cordarone) °Aspirin (ASA) °Cimetidine (Tagamet) °Megestrol (Megace) °NSAIDs (ibuprofen, naproxen, etc.) °Piroxicam (Feldene) °Propafenone (Rythmol SR) °Propranolol (Inderal) °Isoniazid (INH) °Posaconazole (Noxafil) Barbiturates (Phenobarbital) °Carbamazepine (Tegretol) °Chlordiazepoxide (Librium) °Cholestyramine (Questran) °Griseofulvin °Oral Contraceptives °Rifampin °Sucralfate (Carafate) °Vitamin K  ° °Coumadin® (Warfarin) Major Herbal Interactions  °Increased Warfarin Effect Decreased Warfarin Effect  °Garlic °Ginseng °Ginkgo biloba Coenzyme Q10 °Green tea °St. John’s wort   ° °Coumadin® (Warfarin) FOOD Interactions  °Eat   a consistent number of servings per week of foods HIGH in Vitamin K °(1 serving = ½ cup)  °Collards (cooked, or boiled & drained) °Kale (cooked, or boiled & drained) °Mustard greens (cooked, or boiled & drained) °Parsley *serving size only = ¼  cup °Spinach (cooked, or boiled & drained) °Swiss chard (cooked, or boiled & drained) °Turnip greens (cooked, or boiled & drained)  °Eat a consistent number of servings per week of foods MEDIUM-HIGH in Vitamin K °(1 serving = 1 cup)  °Asparagus (cooked, or boiled & drained) °Broccoli (cooked, boiled & drained, or raw & chopped) °Brussel sprouts (cooked, or boiled & drained) *serving size only = ½ cup °Lettuce, raw (green leaf, endive, romaine) °Spinach, raw °Turnip greens, raw & chopped  ° °These websites have more information on Coumadin (warfarin):  www.coumadin.com; °www.ahrq.gov/consumer/coumadin.htm; ° ° ° °

## 2014-09-20 NOTE — Care Management Note (Signed)
    Page 1 of 1   09/24/2014     11:47:44 AM CARE MANAGEMENT NOTE 09/24/2014  Patient:  JIHAN, RUDY   Account Number:  [id number]  Date Initiated:  09/20/2014  Documentation initiated by:  Vaness Jelinski  Subjective/Objective Assessment:   Pt adm on 09/19/14 with CHF, Afib with RVR.  PTA, pt resides at home with sister.     Action/Plan:   Will follow for dc needs as pt progresses.   Anticipated DC Date:  09/25/2014   Anticipated DC Plan:  HOME W HOME HEALTH SERVICES      DC Planning Services  CM consult      Choice offered to / List presented to:     DME arranged  [facility]      DME agency  [facility]        Status of service:  Completed, signed off Medicare Important Message given?  YES (If response is "NO", the following Medicare IM given date fields will be blank) Date Medicare IM given:  09/23/2014 Medicare IM given by:  Anushka Hartinger Date Additional Medicare IM given:   Additional Medicare IM given by:    Discharge Disposition:  HOME/SELF CARE  Per UR Regulation:  Reviewed for med. necessity/level of care/duration of stay  If discussed at Long Length of Stay Meetings, dates discussed:    Comments:  09/24/14 Sidney Ace, RN, BSN [phone] Pt desats with ambulation, and will need home oxygen. Notified [facility] of need for home O2 set up.  Portable tank to be delivered to pt's room prior to dc.   09/23/14 Sidney Ace, RN, BSN [phone]  TEE/CV today; PT consult pending.

## 2014-09-21 LAB — BASIC METABOLIC PANEL
ANION GAP: 7 (ref 5–15)
BUN: 18 mg/dL (ref 6–23)
CALCIUM: 8.6 mg/dL (ref 8.4–10.5)
CO2: 35 mmol/L — AB (ref 19–32)
Chloride: 94 mmol/L — ABNORMAL LOW (ref 96–112)
Creatinine, Ser: 1.5 mg/dL — ABNORMAL HIGH (ref 0.50–1.35)
GFR calc Af Amer: 56 mL/min — ABNORMAL LOW (ref >90)
GFR, EST NON AFRICAN AMERICAN: 48 mL/min — AB (ref >90)
Glucose, Bld: 180 mg/dL — ABNORMAL HIGH (ref 70–99)
Potassium: 3.9 mmol/L (ref 3.5–5.1)
Sodium: 136 mmol/L (ref 135–145)

## 2014-09-21 LAB — GLUCOSE, CAPILLARY
Glucose-Capillary: 185 mg/dL — ABNORMAL HIGH (ref 70–99)
Glucose-Capillary: 197 mg/dL — ABNORMAL HIGH (ref 70–99)
Glucose-Capillary: 133 mg/dL — ABNORMAL HIGH (ref 70–99)
Glucose-Capillary: 158 mg/dL — ABNORMAL HIGH (ref 70–99)

## 2014-09-21 LAB — PROTIME-INR
INR: 2.37 — ABNORMAL HIGH (ref 0.00–1.49)
PROTHROMBIN TIME: 26.1 s — AB (ref 11.6–15.2)

## 2014-09-21 MED ORDER — WARFARIN SODIUM 10 MG PO TABS
10.0000 mg | ORAL_TABLET | Freq: Once | ORAL | Status: AC
  Administered 2014-09-21: 10 mg via ORAL
  Filled 2014-09-21: qty 1

## 2014-09-21 NOTE — Progress Notes (Signed)
Patient ID: [name], male   DOB: [birth year], 64 y.o.   MRN: [id number]   Patient Name: [name] Date of Encounter: 09/21/2014  Primary Cardiologist: Dr. [name]   Principal Problem:   Acute on chronic diastolic heart failure Active Problems:   Diabetes mellitus type 2, uncontrolled, with complications   Hyperlipidemia   OBESITY-MORBID BMI 54   Essential hypertension, benign   CELLULITIS AND ABSCESS OF LEG EXCEPT FOOT   Obstructive sleep apnea- unable to tolerate c-pap   Coronary Artery Disease   Chronic anticoagulation- Coumadin   Atrial fibrillation with RVR    SUBJECTIVE  Breathing better today. No CP.  Still in afib   CURRENT MEDS . furosemide  80 mg Intravenous BID  . gabapentin  1,200 mg Oral Daily  . insulin regular human CONCENTRATED  70 Units Subcutaneous TID WC  . metoprolol tartrate  25 mg Oral BID  . oxyCODONE  30 mg Oral QID  . potassium chloride SA  20 mEq Oral Daily  . rosuvastatin  40 mg Oral q1800  . sodium chloride  3 mL Intravenous Q12H  . spironolactone  25 mg Oral Daily  . warfarin  10 mg Oral ONCE-1800  . Warfarin - Pharmacist Dosing Inpatient   Does not apply q1800    OBJECTIVE  Filed Vitals:   09/21/14 0147 09/21/14 0300 09/21/14 0746 09/21/14 0959  BP: 122/67 117/48  151/87  Pulse: 103 114  78  Temp: 97.9 F (36.6 C) 98.3 F (36.8 C)  97.7 F (36.5 C)  TempSrc: Oral Oral  Oral  Resp: 18 18  18   Height:      Weight:  182.89 kg (403 lb 3.2 oz) 183.117 kg (403 lb 11.2 oz)   SpO2: 96% 98%  97%    Intake/Output Summary (Last 24 hours) at 09/21/14 1058 Last data filed at 09/21/14 1006  Gross per 24 hour  Intake 1528.5 ml  Output   3825 ml  Net -2296.5 ml   Filed Weights   09/20/14 0518 09/21/14 0300 09/21/14 0746  Weight: 179.806 kg (396 lb 6.4 oz) 182.89 kg (403 lb 3.2 oz) 183.117 kg (403 lb 11.2 oz)    PHYSICAL EXAM  General: Pleasant, NAD. Neuro: Alert and oriented X 3. Moves all extremities  spontaneously. Psych: Normal affect. HEENT:  Normal  Neck: Supple without bruits  +JVD. Lungs:  Resp regular and unlabored, CTA. Heart: Irregular. no s3, s4, or murmurs. Abdomen: Soft, non-tender, non-distended, BS + x 4.  Extremities: No clubbing, cyanosis or edema. DP/PT/Radials 2+ and equal bilaterally. Chronic cellulitis of LE  Accessory Clinical Findings  CBC  Recent Labs  09/19/14 1938  WBC 12.2*  NEUTROABS 9.6*  HGB 10.3*  HCT 32.4*  MCV 73.8*  PLT 262   Basic Metabolic Panel  Recent Labs  09/20/14 0447 09/21/14 0405  NA 137 136  K 4.2 3.9  CL 94* 94*  CO2 32 35*  GLUCOSE 234* 180*  BUN 17 18  CREATININE 1.47* 1.50*  CALCIUM 8.6 8.6    TELE A-fib with HR 80-90s, occasionally increase to 130s    ECG  No new EKG  Echocardiogram  LV EF: 60% -  65%  ------------------------------------------------------------------- Indications:   CAD of native vessels 414.01. Shortness of breath 786.05.  ------------------------------------------------------------------- History:  PMH: CAD. Atrial fibrillation. Obstructive Sleep Apnea. Chronic Kidney Disease. Shortness of Breath. MI. Risk factors: Hypertension. Diabetes mellitus. Morbidly obese. Dyslipidemia.  ------------------------------------------------------------------- Study Conclusions  - Left ventricle: The cavity size was normal.  Wall thickness was increased in a pattern of mild LVH. Systolic function was normal. The estimated ejection fraction was in the range of 60% to 65%. Wall motion was normal; there were no regional wall motion abnormalities. Doppler parameters are consistent with abnormal left ventricular relaxation (grade 1 diastolic dysfunction). - Left atrium: The atrium was mildly dilated.  Impressions:  - Technically difficult; definity used; Normal LV function; mild LVH; grade 1 diastolic dysfunction; cannot R/O small oscillating density on aortic valve;  consider TEE if clinically indicated.    Radiology/Studies  Dg Chest 2 View  09/20/2014   CLINICAL DATA:  Short of breath  EXAM: CHEST  2 VIEW  COMPARISON:  02/12/2014  FINDINGS: Cardiac enlargement without heart failure. Lung volume is normal. Prominent lung markings bilaterally are unchanged and likely represent chronic lung disease. Probable interstitial scarring. Negative for pneumonia or effusion.  IMPRESSION: Chronic lung disease is stable.  No superimposed acute abnormality.   Electronically Signed   By: Marlan Palau M.D.   On: 09/20/2014 08:18    ASSESSMENT AND PLAN  1. Paroxysmal A-fib with RVR   - HR better controlled metoprolol to 25mg  BID  - CHA2DS2- VASC score 3 (HTN, DM, HF)  - potential TEE/DCCV on Monday if remain a-fib INR 2.37   2. Acute on chronic diastolic HF  - likely occurred in the setting of rapid a-fib and also run out of lasix for several days  - Echo 12/2013 EF 60-65%, no RWMA, grade 1 diastolic dysfunction. Cannot r/o small oscillating density in aortic valve? (did not see TEE for followup)  - pending echo. Lungs clear continue lasix 80 iv bid .   3. Acute respiratory distress - improved 4. CAD  - PCI of the RCA in 2012 with DES 5. DM 6. HTN 7. Morbid obesity  2013

## 2014-09-21 NOTE — Progress Notes (Signed)
PHARMACY NOTE  Pharmacy Consult :  64 y.o. male is currently on chronic Coumadin for atrial fibrillation .   Dosing Wt :  183 kg   Recent Labs  09/19/14 1452 09/19/14 1938 09/20/14 0447 09/21/14 0405  HGB  --  10.3*  --   --   HCT  --  32.4*  --   --   PLT  --  262  --   --   LABPROT  --   --  22.3* 26.1*  INR 2.3  --  1.94* 2.37*  CREATININE  --  1.54* 1.47* 1.50*    Current Medication[s] Include: Medication PTA: Prescriptions prior to admission  Medication Sig Dispense Refill Last Dose  . amLODipine (NORVASC) 10 MG tablet Take 10 mg by mouth daily.   09/18/2014 at Unknown time  . Ferrous Sulfate (IRON) 325 (65 FE) MG TABS Take 1 tablet by mouth daily.   09/18/2014 at Unknown time  . furosemide (LASIX) 80 MG tablet Take 1 tablet (80 mg total) by mouth 2 (two) times daily. 60 tablet 5 09/18/2014 at Unknown time  . gabapentin (NEURONTIN) 300 MG capsule Take 1,200 mg by mouth 2 (two) times daily.    09/18/2014 at Unknown time  . Hypromellose (NATURAL BALANCE TEARS OP) Place 1 drop into both eyes daily as needed.   09/18/2014  . insulin regular human CONCENTRATED (HUMULIN R) 500 UNIT/ML SOLN injection Inject 90-120 Units into the skin 3 (three) times daily with meals.    09/18/2014 at Unknown time  . metoprolol tartrate (LOPRESSOR) 25 MG tablet Take 0.5 tablets (12.5 mg total) by mouth 2 (two) times daily. (Patient taking differently: Take 25 mg by mouth at bedtime. ) 60 tablet 5 09/18/2014 at 2000  . Multiple Vitamin (MULTIVITAMIN WITH MINERALS) TABS Take 1 tablet by mouth daily.   09/18/2014 at Unknown time  . oxycodone (ROXICODONE) 30 MG immediate release tablet Take 30 mg by mouth 4 (four) times daily.    09/18/2014 at Unknown time  . Podiatric Products (DIADERM FOOT REJUVENATING) CREA Apply 1 application topically daily as needed (dry skin).   Past Week at Unknown time  . potassium chloride SA (KLOR-CON M20) 20 MEQ tablet Take 1 tablet (20 mEq total) by mouth daily.  30 tablet 0 09/18/2014 at Unknown time  . rosuvastatin (CRESTOR) 40 MG tablet Take 40 mg by mouth daily.   09/18/2014 at Unknown time  . spironolactone (ALDACTONE) 25 MG tablet Take 1 tablet (25 mg total) by mouth daily. 30 tablet 2 09/18/2014 at Unknown time  . warfarin (COUMADIN) 5 MG tablet Take as directed by coumadin clinic (Patient taking differently: Take 5 mg by mouth. 5 mg on Mon, Wed, Fri, Sat Sun, and 7.5 mg on Tues and Thurs) 45 tablet 0 09/17/2014 at Unknown time  . ACCU-CHEK AVIVA PLUS test strip   0   . ACCU-CHEK FASTCLIX LANCETS MISC    Taking  . BD INSULIN SYRINGE ULTRAFINE 31G X 15/64" 0.3 ML MISC   0   . TRULICITY 0.75 MG/0.5ML SOPN Take 1 tablet by mouth once a week.   09/16/2014   Scheduled:  Scheduled:  . furosemide  80 mg Intravenous BID  . gabapentin  1,200 mg Oral Daily  . insulin regular human CONCENTRATED  70 Units Subcutaneous TID WC  . metoprolol tartrate  25 mg Oral BID  . oxyCODONE  30 mg Oral QID  . potassium chloride SA  20 mEq Oral Daily  . rosuvastatin  40 mg Oral  [id number]  . sodium chloride  3 mL Intravenous Q12H  . spironolactone  25 mg Oral Daily  . Warfarin - Pharmacist Dosing Inpatient   Does not apply q1800    Assessment :  Today's INR 2.37.   INR within therapeutic range.  No evidence of bleeding complications observed.  Goal :  TARGET INR 2-3   Plan : 1. Will give Coumadin 10 mg po today. 2. Daily INR's, CBC. Monitor for bleeding complications.   Follow Platelet counts.  Velda Shell, Pharm.D. 09/21/2014  10:37 AM

## 2014-09-22 LAB — GLUCOSE, CAPILLARY
GLUCOSE-CAPILLARY: 161 mg/dL — AB (ref 70–99)
GLUCOSE-CAPILLARY: 152 mg/dL — AB (ref 70–99)
GLUCOSE-CAPILLARY: 162 mg/dL — AB (ref 70–99)
GLUCOSE-CAPILLARY: 184 mg/dL — AB (ref 70–99)

## 2014-09-22 LAB — BASIC METABOLIC PANEL
Anion gap: 12 (ref 5–15)
BUN: 17 mg/dL (ref 6–23)
CO2: 31 mmol/L (ref 19–32)
CREATININE: 1.36 mg/dL — AB (ref 0.50–1.35)
Calcium: 8.7 mg/dL (ref 8.4–10.5)
Chloride: 94 mmol/L — ABNORMAL LOW (ref 96–112)
GFR calc Af Amer: 63 mL/min — ABNORMAL LOW (ref >90)
GFR calc non Af Amer: 54 mL/min — ABNORMAL LOW (ref >90)
Glucose, Bld: 118 mg/dL — ABNORMAL HIGH (ref 70–99)
Potassium: 3.4 mmol/L — ABNORMAL LOW (ref 3.5–5.1)
Sodium: 137 mmol/L (ref 135–145)

## 2014-09-22 LAB — PROTIME-INR
INR: 3.13 — ABNORMAL HIGH (ref 0.00–1.49)
Prothrombin Time: 32.4 seconds — ABNORMAL HIGH (ref 11.6–15.2)

## 2014-09-22 MED ORDER — POTASSIUM CHLORIDE ER 10 MEQ PO TBCR
20.0000 meq | EXTENDED_RELEASE_TABLET | Freq: Once | ORAL | Status: AC
  Administered 2014-09-22: 20 meq via ORAL
  Filled 2014-09-22: qty 2

## 2014-09-22 MED ORDER — OXYCODONE HCL 5 MG PO TABS
30.0000 mg | ORAL_TABLET | Freq: Four times a day (QID) | ORAL | Status: DC | PRN
  Administered 2014-09-22 – 2014-09-24 (×8): 30 mg via ORAL
  Filled 2014-09-22 (×9): qty 6

## 2014-09-22 NOTE — Progress Notes (Signed)
16 paged md regarding need for PRN break through pain medication orders.

## 2014-09-22 NOTE — Anesthesia Preprocedure Evaluation (Addendum)
Anesthesia Evaluation  Patient identified by MRN, date of birth, ID band Patient awake    Reviewed: Allergy & Precautions, NPO status , Patient's Chart, lab work & pertinent test results  Airway Mallampati: II   Neck ROM: Full    Dental  (+) Poor Dentition, Missing,    Pulmonary shortness of breath, sleep apnea , former smoker,          Cardiovascular hypertension, Pt. on medications + CAD, + Past MI and +CHF Rhythm:Irregular  EF 65%   Neuro/Psych    GI/Hepatic Neg liver ROS,   Endo/Other  diabetes, Type 2, Insulin Dependent  Renal/GU Renal InsufficiencyRenal disease     Musculoskeletal   Abdominal (+) + obese,   Peds  Hematology   Anesthesia Other Findings   Reproductive/Obstetrics                            Anesthesia Physical Anesthesia Plan  ASA: IV  Anesthesia Plan: MAC   Post-op Pain Management:    Induction: Intravenous  Airway Management Planned: Nasal Cannula  Additional Equipment:   Intra-op Plan:   Post-operative Plan:   Informed Consent: I have reviewed the patients History and Physical, chart, labs and discussed the procedure including the risks, benefits and alternatives for the proposed anesthesia with the patient or authorized representative who has indicated his/her understanding and acceptance.     Plan Discussed with:   Anesthesia Plan Comments:         Anesthesia Quick Evaluation

## 2014-09-22 NOTE — Progress Notes (Signed)
Patient ID: ABE SCHOOLS, male   DOB: 17-Jul-1950, 64 y.o.   MRN: [id number]     Patient Name: [name] Date of Encounter: 09/22/2014  Primary Cardiologist: Dr. [name]   Principal Problem:   Acute on chronic diastolic heart failure Active Problems:   Diabetes mellitus type 2, uncontrolled, with complications   Hyperlipidemia   OBESITY-MORBID BMI 54   Essential hypertension, benign   CELLULITIS AND ABSCESS OF LEG EXCEPT FOOT   Obstructive sleep apnea- unable to tolerate c-pap   Coronary Artery Disease   Chronic anticoagulation- Coumadin   Atrial fibrillation with RVR    SUBJECTIVE  Less dyspnic  Ok with TEE/DCC in am   CURRENT MEDS . furosemide  80 mg Intravenous BID  . gabapentin  1,200 mg Oral Daily  . insulin regular human CONCENTRATED  70 Units Subcutaneous TID WC  . metoprolol tartrate  25 mg Oral BID  . potassium chloride SA  20 mEq Oral Daily  . rosuvastatin  40 mg Oral q1800  . sodium chloride  3 mL Intravenous Q12H  . spironolactone  25 mg Oral Daily  . Warfarin - Pharmacist Dosing Inpatient   Does not apply q1800    OBJECTIVE  Filed Vitals:   09/21/14 2110 09/22/14 0200 09/22/14 0544 09/22/14 0900  BP: 135/56 117/58 134/74 131/57  Pulse: 121 93 93 104  Temp: 97.8 F (36.6 C) 98.4 F (36.9 C) 98.9 F (37.2 C) 98 F (36.7 C)  TempSrc: Oral Oral Oral Oral  Resp: 18 20 20 20   Height:      Weight:   182.5 kg (402 lb 5.4 oz)   SpO2: 97% 97%  96%    Intake/Output Summary (Last 24 hours) at 09/22/14 1017 Last data filed at 09/22/14 1000  Gross per 24 hour  Intake   1080 ml  Output   3250 ml  Net  -2170 ml   Filed Weights   09/21/14 0300 09/21/14 0746 09/22/14 0544  Weight: 182.89 kg (403 lb 3.2 oz) 183.117 kg (403 lb 11.2 oz) 182.5 kg (402 lb 5.4 oz)    PHYSICAL EXAM  General: Pleasant, NAD. Neuro: Alert and oriented X 3. Moves all extremities spontaneously. Psych: Normal affect. HEENT:  Normal  Neck: Supple without bruits   +JVD. Lungs:  Resp regular and unlabored, CTA. Heart: Irregular. no s3, s4, or murmurs. Abdomen: Soft, non-tender, non-distended, BS + x 4.  Extremities: No clubbing, cyanosis or edema. DP/PT/Radials 2+ and equal bilaterally. Chronic cellulitis of LE  Accessory Clinical Findings  CBC  Recent Labs  09/19/14 1938  WBC 12.2*  NEUTROABS 9.6*  HGB 10.3*  HCT 32.4*  MCV 73.8*  PLT 262   Basic Metabolic Panel  Recent Labs  09/21/14 0405 09/22/14 0550  NA 136 137  K 3.9 3.4*  CL 94* 94*  CO2 35* 31  GLUCOSE 180* 118*  BUN 18 17  CREATININE 1.50* 1.36*  CALCIUM 8.6 8.7    TELE A-fib with HR 80-90s, occasionally increase to 130s    ECG  No new EKG  Echocardiogram  LV EF: 60% -  65%  ------------------------------------------------------------------- Indications:   CAD of native vessels 414.01. Shortness of breath 786.05.  ------------------------------------------------------------------- History:  PMH: CAD. Atrial fibrillation. Obstructive Sleep Apnea. Chronic Kidney Disease. Shortness of Breath. MI. Risk factors: Hypertension. Diabetes mellitus. Morbidly obese. Dyslipidemia.  ------------------------------------------------------------------- Study Conclusions  - Left ventricle: The cavity size was normal. Wall thickness was increased in a pattern of mild LVH. Systolic function was normal.  The estimated ejection fraction was in the range of 60% to 65%. Wall motion was normal; there were no regional wall motion abnormalities. Doppler parameters are consistent with abnormal left ventricular relaxation (grade 1 diastolic dysfunction). - Left atrium: The atrium was mildly dilated.  Impressions:  - Technically difficult; definity used; Normal LV function; mild LVH; grade 1 diastolic dysfunction; cannot R/O small oscillating density on aortic valve; consider TEE if clinically indicated.    Radiology/Studies  Dg Chest 2  View  09/20/2014   CLINICAL DATA:  Short of breath  EXAM: CHEST  2 VIEW  COMPARISON:  02/12/2014  FINDINGS: Cardiac enlargement without heart failure. Lung volume is normal. Prominent lung markings bilaterally are unchanged and likely represent chronic lung disease. Probable interstitial scarring. Negative for pneumonia or effusion.  IMPRESSION: Chronic lung disease is stable.  No superimposed acute abnormality.   Electronically Signed   By: Marlan Palau M.D.   On: 09/20/2014 08:18    ASSESSMENT AND PLAN  1. Paroxysmal A-fib with RVR   - HR better controlled metoprolol to 25mg  BID  - CHA2DS2- VASC score 3 (HTN, DM, HF)  - TEE/DCCV on Monday if remain a-fib INR 3.13   Orders written   2. Acute on chronic diastolic HF  - over 2 L diuresis yesterday  - likely occurred in the setting of rapid a-fib and also run out of lasix for several days  - Echo 12/2013 EF 60-65%, no RWMA, grade 1 diastolic dysfunction. Cannot r/o small oscillating density in aortic valve? (did not see TEE for followup)  - pending echo. Lungs clear continue lasix 80 iv bid . Supple K   3. Acute respiratory distress - improved 4. CAD  - PCI of the RCA in 2012 with DES 5. DM 6. HTN 7. Morbid obesity  2013

## 2014-09-22 NOTE — Progress Notes (Signed)
PHARMACY NOTE  Pharmacy Consult :  64 y.o. male is currently on Chronic Coumadin for atrial fibrillation .   Dosing Wt :  183 kg  Recent Labs  09/19/14 1452 09/19/14 1938 09/20/14 0447 09/21/14 0405 09/22/14 0550  HGB  --  10.3*  --   --   --   HCT  --  32.4*  --   --   --   PLT  --  262  --   --   --   LABPROT  --   --  22.3* 26.1* 32.4*  INR 2.3  --  1.94* 2.37* 3.13*  CREATININE  --  1.54* 1.47* 1.50* 1.36*    Current Medication[s] Include: Medication PTA: Prescriptions prior to admission  Medication Sig Dispense Refill Last Dose  . amLODipine (NORVASC) 10 MG tablet Take 10 mg by mouth daily.   09/18/2014 at Unknown time  . Ferrous Sulfate (IRON) 325 (65 FE) MG TABS Take 1 tablet by mouth daily.   09/18/2014 at Unknown time  . furosemide (LASIX) 80 MG tablet Take 1 tablet (80 mg total) by mouth 2 (two) times daily. 60 tablet 5 09/18/2014 at Unknown time  . gabapentin (NEURONTIN) 300 MG capsule Take 1,200 mg by mouth 2 (two) times daily.    09/18/2014 at Unknown time  . Hypromellose (NATURAL BALANCE TEARS OP) Place 1 drop into both eyes daily as needed.   09/18/2014  . insulin regular human CONCENTRATED (HUMULIN R) 500 UNIT/ML SOLN injection Inject 90-120 Units into the skin 3 (three) times daily with meals.    09/18/2014 at Unknown time  . metoprolol tartrate (LOPRESSOR) 25 MG tablet Take 0.5 tablets (12.5 mg total) by mouth 2 (two) times daily. (Patient taking differently: Take 25 mg by mouth at bedtime. ) 60 tablet 5 09/18/2014 at 2000  . Multiple Vitamin (MULTIVITAMIN WITH MINERALS) TABS Take 1 tablet by mouth daily.   09/18/2014 at Unknown time  . oxycodone (ROXICODONE) 30 MG immediate release tablet Take 30 mg by mouth 4 (four) times daily.    09/18/2014 at Unknown time  . Podiatric Products (DIADERM FOOT REJUVENATING) CREA Apply 1 application topically daily as needed (dry skin).   Past Week at Unknown time  . potassium chloride SA (KLOR-CON M20) 20 MEQ tablet  Take 1 tablet (20 mEq total) by mouth daily. 30 tablet 0 09/18/2014 at Unknown time  . rosuvastatin (CRESTOR) 40 MG tablet Take 40 mg by mouth daily.   09/18/2014 at Unknown time  . spironolactone (ALDACTONE) 25 MG tablet Take 1 tablet (25 mg total) by mouth daily. 30 tablet 2 09/18/2014 at Unknown time  . warfarin (COUMADIN) 5 MG tablet Take as directed by coumadin clinic (Patient taking differently: Take 5 mg by mouth. 5 mg on Mon, Wed, Fri, Sat Sun, and 7.5 mg on Tues and Thurs) 45 tablet 0 09/17/2014 at Unknown time  . ACCU-CHEK AVIVA PLUS test strip   0   . ACCU-CHEK FASTCLIX LANCETS MISC    Taking  . BD INSULIN SYRINGE ULTRAFINE 31G X 15/64" 0.3 ML MISC   0   . TRULICITY 0.75 MG/0.5ML SOPN Take 1 tablet by mouth once a week.   09/16/2014   Scheduled:  Scheduled:  . furosemide  80 mg Intravenous BID  . gabapentin  1,200 mg Oral Daily  . insulin regular human CONCENTRATED  70 Units Subcutaneous TID WC  . metoprolol tartrate  25 mg Oral BID  . potassium chloride SA  20 mEq Oral Daily  .  rosuvastatin  40 mg Oral q1800  . sodium chloride  3 mL Intravenous Q12H  . spironolactone  25 mg Oral Daily  . Warfarin - Pharmacist Dosing Inpatient   Does not apply q1800    Assessment :  Today's INR up slightly above therapeutic goal.   INR  3.13  No evidence of bleeding complications observed.  Goal :  TARGET INR 2-3   Plan : 1. Will Hold Coumadin today following jump in INR from 2.37 - 3.13. 2. Daily INR's, CBC. Monitor for bleeding complications.   Follow Platelet counts.  Velda Shell, Pharm.D. 09/22/2014  11:02 AM

## 2014-09-23 ENCOUNTER — Inpatient Hospital Stay (HOSPITAL_COMMUNITY): Payer: Medicare Other | Admitting: Anesthesiology

## 2014-09-23 ENCOUNTER — Encounter (HOSPITAL_COMMUNITY)
Admission: AD | Disposition: A | Discharge status: Home / Self Care | Payer: Self-pay | Source: Ambulatory Visit | Attending: Cardiology

## 2014-09-23 ENCOUNTER — Encounter (HOSPITAL_COMMUNITY): Payer: Self-pay | Admitting: *Deleted

## 2014-09-23 DIAGNOSIS — I340 Nonrheumatic mitral (valve) insufficiency: Secondary | ICD-10-CM

## 2014-09-23 DIAGNOSIS — I4891 Unspecified atrial fibrillation: Secondary | ICD-10-CM

## 2014-09-23 HISTORY — PX: CARDIOVERSION: SHX1299

## 2014-09-23 HISTORY — PX: TEE WITHOUT CARDIOVERSION: SHX5443

## 2014-09-23 LAB — GLUCOSE, CAPILLARY
GLUCOSE-CAPILLARY: 73 mg/dL (ref 70–99)
Glucose-Capillary: 70 mg/dL (ref 70–99)
Glucose-Capillary: 285 mg/dL — ABNORMAL HIGH (ref 70–99)
Glucose-Capillary: 346 mg/dL — ABNORMAL HIGH (ref 70–99)

## 2014-09-23 LAB — BASIC METABOLIC PANEL
Anion gap: 12 (ref 5–15)
BUN: 18 mg/dL (ref 6–23)
CALCIUM: 9.1 mg/dL (ref 8.4–10.5)
CO2: 30 mmol/L (ref 19–32)
CREATININE: 1.4 mg/dL — AB (ref 0.50–1.35)
Chloride: 96 mmol/L (ref 96–112)
GFR, EST AFRICAN AMERICAN: 61 mL/min — AB (ref >90)
GFR, EST NON AFRICAN AMERICAN: 52 mL/min — AB (ref >90)
Glucose, Bld: 79 mg/dL (ref 70–99)
Potassium: 3.7 mmol/L (ref 3.5–5.1)
SODIUM: 138 mmol/L (ref 135–145)

## 2014-09-23 LAB — PROTIME-INR
INR: 3.06 — ABNORMAL HIGH (ref 0.00–1.49)
Prothrombin Time: 31.9 seconds — ABNORMAL HIGH (ref 11.6–15.2)

## 2014-09-23 SURGERY — ECHOCARDIOGRAM, TRANSESOPHAGEAL
Anesthesia: Monitor Anesthesia Care

## 2014-09-23 MED ORDER — GLUCOSE 40 % PO GEL
ORAL | Status: AC
  Filled 2014-09-23: qty 1

## 2014-09-23 MED ORDER — PROPOFOL 10 MG/ML IV BOLUS
INTRAVENOUS | Status: DC | PRN
  Administered 2014-09-23 (×2): 30 mg via INTRAVENOUS
  Administered 2014-09-23: 50 mg via INTRAVENOUS
  Administered 2014-09-23: 30 mg via INTRAVENOUS

## 2014-09-23 MED ORDER — INSULIN REGULAR HUMAN (CONC) 500 UNIT/ML ~~LOC~~ SOLN
70.0000 [IU] | Freq: Three times a day (TID) | SUBCUTANEOUS | Status: DC
  Administered 2014-09-23 – 2014-09-24 (×3): 70 [IU] via SUBCUTANEOUS
  Filled 2014-09-23 (×3): qty 20

## 2014-09-23 MED ORDER — SODIUM CHLORIDE 0.9 % IV SOLN
INTRAVENOUS | Status: DC | PRN
  Administered 2014-09-23: 13:00:00 via INTRAVENOUS

## 2014-09-23 MED ORDER — PROPOFOL INFUSION 10 MG/ML OPTIME: INTRAVENOUS | Status: DC | PRN

## 2014-09-23 MED ORDER — BUTAMBEN-TETRACAINE-BENZOCAINE 2-2-14 % EX AERO
INHALATION_SPRAY | CUTANEOUS | Status: DC | PRN
  Administered 2014-09-23: 2 via TOPICAL

## 2014-09-23 MED ORDER — MIDAZOLAM HCL 2 MG/2ML IJ SOLN
INTRAMUSCULAR | Status: AC
  Filled 2014-09-23: qty 2

## 2014-09-23 MED ORDER — GLUCOSE 40 % PO GEL
1.0000 | Freq: Once | ORAL | Status: AC
  Administered 2014-09-23: 37.5 g via ORAL

## 2014-09-23 MED ORDER — SODIUM CHLORIDE 0.9 % IV SOLN
INTRAVENOUS | Status: DC
  Administered 2014-09-23: 05:00:00 via INTRAVENOUS
  Administered 2014-09-23: 500 mL via INTRAVENOUS

## 2014-09-23 MED ORDER — WARFARIN SODIUM 7.5 MG PO TABS
7.5000 mg | ORAL_TABLET | Freq: Once | ORAL | Status: AC
  Administered 2014-09-23: 7.5 mg via ORAL
  Filled 2014-09-23: qty 1

## 2014-09-23 NOTE — Progress Notes (Signed)
ANTICOAGULATION CONSULT NOTE - Follow Up Consult  Pharmacy Consult for coumadin Indication: atrial fibrillation  No Known Allergies  Patient Measurements: Height: 6' (182.9 cm) Weight: (!) 400 lb 11.2 oz (181.756 kg) (scale b) IBW/kg (Calculated) : 77.6  Vital Signs: Temp: 98.2 F (36.8 C) (03/14 1443) Temp Source: Oral (03/14 1443) BP: 129/65 mmHg (03/14 1443) Pulse Rate: 88 (03/14 1443)  Labs:  Recent Labs  09/21/14 0405 09/22/14 0550 09/23/14 0519 09/23/14 0909  LABPROT 26.1* 32.4* 31.9*  --   INR 2.37* 3.13* 3.06*  --   CREATININE 1.50* 1.36*  --  1.40*    Estimated Creatinine Clearance: 92.3 mL/min (by C-G formula based on Cr of 1.4).  Assessment: 63 yo M on coumadin pta for afib.  INR 3.06 today after dose held yesterday.  S/p DCCV today to NSR.  Home dose 5 mg qday x 7.5 mg on Tues/Thurs.  No bleeding reported.   Goal of Therapy:  INR 2-3    Plan:  -coumadin 7.5 mg po x 1 dose today -daily INR  Herby Abraham, Pharm.D. [phone] 09/23/2014 3:00 PM

## 2014-09-23 NOTE — Interval H&P Note (Signed)
History and Physical Interval Note:  09/23/2014 12:39 PM  [name]  has presented today for surgery, with the diagnosis of AFIB  The various methods of treatment have been discussed with the patient and family. After consideration of risks, benefits and other options for treatment, the patient has consented to  Procedure(s): TRANSESOPHAGEAL ECHOCARDIOGRAM (TEE) (N/A) CARDIOVERSION (N/A) as a surgical intervention .  The patient's history has been reviewed, patient examined, no change in status, stable for surgery.  I have reviewed the patient's chart and labs.  Questions were answered to the patient's satisfaction.     Olga Millers

## 2014-09-23 NOTE — Progress Notes (Signed)
  Echocardiogram Echocardiogram Transesophageal has been performed.  [name] 09/23/2014, 3:33 PM

## 2014-09-23 NOTE — Progress Notes (Addendum)
Adult (Non-Pregnant) Hypoglycemia Protocol Treatment Guidelines  1.  RN shall initiate Hypoglycemia Protocol emergency measures immediately when:            w Routine or STAT CBG and/or a lab glucose indicates hypoglycemia (CBG < 70 mg/dl)  2.  Treat the patient according to ability to take PO's and severity of hypoglycemia.   3.  If patient is on GlucoStabilizer, follow directions provided by the GlucoStabilizer for hypoglycemic events.  4.  If patient on insulin pump, follow Hypoglycemia Protocol.  If patient requires more than one treatment have patient place pump in SUSPEND and notify MD.  DO NOT leave pump in SUSPEND for greater than 30 minutes unless ordered by MD.  A.  Treatment for Mild or Moderate-Patient cooperative and able to swallow    1.  Patient taking PO's and can cooperate   a.  Give one of the following 15 gram CHO options:                           w 1 tube oral dextrose gel                           w 3-4 Glucose tablets                           w 4 oz. Juice                           w 4 oz. regular soda                                    ESRD patients:  clear, regular soda                           w 8 oz. skim milk    b.  Recheck CBG in 15 minutes after treatment                            w If CBG < 70 mg/dl, repeat treatment and recheck until hypoglycemia is resolved                            w If CBG > 70 mg/dl and next meal is more than 1 hour away, give additional 15 grams CHO   2.  Patient NPO-Patient cooperative and no altered mental status    a.  Give 25 ml of D50 IV.   b.  Recheck CBG in 15 minutes after treatment.                             w If CBG is less than 70 mg/dl, repeat treatment and recheck until hypoglycemia is resolved.   c.  Notify MD for further orders.             SPECIAL CONSIDERATIONS:    a.  If no IV access,                              w Start IV of D5W at KVO                               w Give 25 ml of D50 IV.    b.  If  unable to gain IV access                             w Give Glucagon IM:    i.  1 mg if patient weighs more than 45.5 kg    ii.  0.5 mg if patient weighs less than 45.5 kg   c.  Notify MD for further orders  B.  Treatment for Severe-- Patient unconscious or unable to take PO's safely    1.  Position patient on side   2.  Give 50 ml D50 IV   3.  Recheck CBG in 15 minutes.                    w If CBG is less than 70 mg/dl, repeat treatment and recheck until hypoglycemia is resolved.   4.  Notify MD for further orders.    SPECIAL CONSIDERATIONS:    a.  If no IV access                              w Give Glucagon IM                                        i.  1 mg if patient weighs more than 45.5 kg                                       ii.  0.5 mg if patient weighs less than 45.5 kg                              w Start IV of D5W at 50 ml/hr and give 50 ml D50 IV   b.  If no IV access and active seizure                               w Call Rapid Response   c.  If unable to gain IV access, give Glucagon IM:                              w 1 mg if patient weighs more than 45.5 kg                              w 0.5 mg if patient weighs less than 45.5 kg   d.  Notify MD for further orders.  C.  Complete smart text progress note to document intervention and follow-up CBG   1.  In [facility] patient chart, click on Notes (left side of screen)   2.  Create Progress Note  Hypoglycemic Event  CBG: 70  Treatment: 15 GM gel  Symptoms: Nervous/irritable  Follow-up CBG: Time:0850 CBG Result: 73  Possible Reasons for Event: Other: NPO  Comments/MD notified: MD made aware. Continue to follow protocol and monitor.    [name]

## 2014-09-23 NOTE — CV Procedure (Signed)
See full TEE report in [employer]; normal LV function; no LAA thrombus; patient subsequently sedated by anesthesia with diprovan 140 mg total IV; synchronized DCCV with 120 J resulted in NSR; no immediate complications; continue coumadin. Olga Millers

## 2014-09-23 NOTE — Progress Notes (Addendum)
The consent was signed and placed in the patient's chart.  He complained of back pain overnight and received PRN Oxy IR.  He was upset this morning about the lack of fluids due to being NPO.  The RN offered him mouth swabs and he declined.  He was given a heart failure packet.

## 2014-09-23 NOTE — H&P (View-Only) (Signed)
Patient Name: [name] Date of Encounter: 09/23/2014     Principal Problem:   Acute on chronic diastolic heart failure Active Problems:   Diabetes mellitus type 2, uncontrolled, with complications   Hyperlipidemia   OBESITY-MORBID BMI 54   Essential hypertension, benign   CELLULITIS AND ABSCESS OF LEG EXCEPT FOOT   Obstructive sleep apnea- unable to tolerate c-pap   Coronary Artery Disease   Chronic anticoagulation- Coumadin   Atrial fibrillation with RVR    SUBJECTIVE  Patient with recurrent atrial fibrillation.  He has been on long-term Coumadin.  His rate is better controlled but he remains in atrial fibrillation.  We will proceed with TEE/cardioversion today if schedule permits.  Patient is nothing by mouth.  CURRENT MEDS . furosemide  80 mg Intravenous BID  . gabapentin  1,200 mg Oral Daily  . insulin regular human CONCENTRATED  70 Units Subcutaneous TID WC  . metoprolol tartrate  25 mg Oral BID  . potassium chloride SA  20 mEq Oral Daily  . rosuvastatin  40 mg Oral q1800  . sodium chloride  3 mL Intravenous Q12H  . spironolactone  25 mg Oral Daily  . Warfarin - Pharmacist Dosing Inpatient   Does not apply q1800    OBJECTIVE  Filed Vitals:   09/22/14 0900 09/22/14 1300 09/22/14 2133 09/23/14 0509  BP: 131/57 112/57 136/72 117/69  Pulse: 104 81 90 106  Temp: 98 F (36.7 C) 97.9 F (36.6 C) 97.5 F (36.4 C) 98.6 F (37 C)  TempSrc: Oral Oral Oral Oral  Resp: 20 20 21 18   Height:      Weight:    400 lb 11.2 oz (181.756 kg)  SpO2: 96% 97% 98% 98%    Intake/Output Summary (Last 24 hours) at 09/23/14 0819 Last data filed at 09/23/14 0600  Gross per 24 hour  Intake 2169.33 ml  Output   3600 ml  Net -1430.67 ml   Filed Weights   09/21/14 0746 09/22/14 0544 09/23/14 0509  Weight: 403 lb 11.2 oz (183.117 kg) 402 lb 5.4 oz (182.5 kg) 400 lb 11.2 oz (181.756 kg)    PHYSICAL EXAM  General: Pleasant, NAD.  Morbidly obese Neuro: Alert and oriented X  3. Moves all extremities spontaneously. Psych: Normal affect. HEENT:  Normal  Neck: Supple without bruits or JVD. Lungs:  Resp regular and unlabored, CTA. Heart: Irregularly irregular. no s3, s4, or murmurs. Abdomen: Soft, non-tender, non-distended, BS + x 4.  Extremities: Trace edema  Accessory Clinical Findings  CBC No results for input(s): WBC, NEUTROABS, HGB, HCT, MCV, PLT in the last 72 hours. Basic Metabolic Panel  Recent Labs  09/21/14 0405 09/22/14 0550  NA 136 137  K 3.9 3.4*  CL 94* 94*  CO2 35* 31  GLUCOSE 180* 118*  BUN 18 17  CREATININE 1.50* 1.36*  CALCIUM 8.6 8.7   Liver Function Tests No results for input(s): AST, ALT, ALKPHOS, BILITOT, PROT, ALBUMIN in the last 72 hours. No results for input(s): LIPASE, AMYLASE in the last 72 hours. Cardiac Enzymes No results for input(s): CKTOTAL, CKMB, CKMBINDEX, TROPONINI in the last 72 hours. BNP Invalid input(s): POCBNP D-Dimer No results for input(s): DDIMER in the last 72 hours. Hemoglobin A1C No results for input(s): HGBA1C in the last 72 hours. Fasting Lipid Panel No results for input(s): CHOL, HDL, LDLCALC, TRIG, CHOLHDL, LDLDIRECT in the last 72 hours. Thyroid Function Tests No results for input(s): TSH, T4TOTAL, T3FREE, THYROIDAB in the last 72 hours.  Invalid  input(s): FREET3  TELE  Atrial fibrillation with controlled ventricular response  ECG     Radiology/Studies  Dg Chest 2 View  09/20/2014   CLINICAL DATA:  Short of breath  EXAM: CHEST  2 VIEW  COMPARISON:  02/12/2014  FINDINGS: Cardiac enlargement without heart failure. Lung volume is normal. Prominent lung markings bilaterally are unchanged and likely represent chronic lung disease. Probable interstitial scarring. Negative for pneumonia or effusion.  IMPRESSION: Chronic lung disease is stable.  No superimposed acute abnormality.   Electronically Signed   By: Marlan Palau M.D.   On: 09/20/2014 08:18    ASSESSMENT AND PLAN  1.  Paroxysmal A-fib with RVR  - HR better controlled metoprolol to 25mg  BID - CHA2DS2- VASC score 3 (HTN, DM, HF) - TEE/DCCV on Monday if remain a-fib INR 3.13  2. Acute on chronic diastolic HF - Weight is coming down. - likely occurred in the setting of rapid a-fib and also run out of lasix for several days - Echo 12/2013 EF 60-65%, no RWMA, grade 1 diastolic dysfunction. Cannot r/o small oscillating density in aortic valve?  Check question of aortic valve oscillating density at time of TEE cardioversion BMET today pending.  Continue IV Lasix and potassium supplementation  3. Acute respiratory distress - improved 4. CAD - PCI of the RCA in 2012 with DES 5. DM 6. HTN 7. Morbid obesity  Signed, 2013 MD

## 2014-09-23 NOTE — Transfer of Care (Signed)
Immediate Anesthesia Transfer of Care Note  Patient: [name]  Procedure(s) Performed: Procedure(s): TRANSESOPHAGEAL ECHOCARDIOGRAM (TEE) (N/A) CARDIOVERSION (N/A)  Patient Location: Endoscopy Unit  Anesthesia Type:MAC  Level of Consciousness: awake, alert  and oriented  Airway & Oxygen Therapy: Patient Spontanous Breathing and Patient connected to nasal cannula oxygen  Post-op Assessment: Report given to RN and Post -op Vital signs reviewed and stable  Post vital signs: Reviewed and stable  Last Vitals:  Filed Vitals:   09/23/14 1348  BP: 103/49  Pulse:   Temp: 36.4 C  Resp:     Complications: No apparent anesthesia complications

## 2014-09-23 NOTE — Anesthesia Postprocedure Evaluation (Signed)
  Anesthesia Post-op Note  Patient: [name]  Procedure(s) Performed: Procedure(s): TRANSESOPHAGEAL ECHOCARDIOGRAM (TEE) (N/A) CARDIOVERSION (N/A)  Patient Location: PACU and Endoscopy Unit  Anesthesia Type:MAC  Level of Consciousness: awake and alert   Airway and Oxygen Therapy: Patient Spontanous Breathing and Patient connected to nasal cannula oxygen  Post-op Pain: none  Post-op Assessment: Post-op Vital signs reviewed, Patient's Cardiovascular Status Stable, Respiratory Function Stable, Patent Airway and No signs of Nausea or vomiting  Post-op Vital Signs: Reviewed and stable  Last Vitals:  Filed Vitals:   09/23/14 1348  BP: 103/49  Pulse:   Temp: 36.4 C  Resp:     Complications: No apparent anesthesia complications

## 2014-09-23 NOTE — Progress Notes (Signed)
 Patient Name: [name] Date of Encounter: 09/23/2014     Principal Problem:   Acute on chronic diastolic heart failure Active Problems:   Diabetes mellitus type 2, uncontrolled, with complications   Hyperlipidemia   OBESITY-MORBID BMI 54   Essential hypertension, benign   CELLULITIS AND ABSCESS OF LEG EXCEPT FOOT   Obstructive sleep apnea- unable to tolerate c-pap   Coronary Artery Disease   Chronic anticoagulation- Coumadin   Atrial fibrillation with RVR    SUBJECTIVE  Patient with recurrent atrial fibrillation.  He has been on long-term Coumadin.  His rate is better controlled but he remains in atrial fibrillation.  We will proceed with TEE/cardioversion today if schedule permits.  Patient is nothing by mouth.  CURRENT MEDS . furosemide  80 mg Intravenous BID  . gabapentin  1,200 mg Oral Daily  . insulin regular human CONCENTRATED  70 Units Subcutaneous TID WC  . metoprolol tartrate  25 mg Oral BID  . potassium chloride SA  20 mEq Oral Daily  . rosuvastatin  40 mg Oral q1800  . sodium chloride  3 mL Intravenous Q12H  . spironolactone  25 mg Oral Daily  . Warfarin - Pharmacist Dosing Inpatient   Does not apply q1800    OBJECTIVE  Filed Vitals:   09/22/14 0900 09/22/14 1300 09/22/14 2133 09/23/14 0509  BP: 131/57 112/57 136/72 117/69  Pulse: 104 81 90 106  Temp: 98 F (36.7 C) 97.9 F (36.6 C) 97.5 F (36.4 C) 98.6 F (37 C)  TempSrc: Oral Oral Oral Oral  Resp: 20 20 21 18  Height:      Weight:    400 lb 11.2 oz (181.756 kg)  SpO2: 96% 97% 98% 98%    Intake/Output Summary (Last 24 hours) at 09/23/14 0819 Last data filed at 09/23/14 0600  Gross per 24 hour  Intake 2169.33 ml  Output   3600 ml  Net -1430.67 ml   Filed Weights   09/21/14 0746 09/22/14 0544 09/23/14 0509  Weight: 403 lb 11.2 oz (183.117 kg) 402 lb 5.4 oz (182.5 kg) 400 lb 11.2 oz (181.756 kg)    PHYSICAL EXAM  General: Pleasant, NAD.  Morbidly obese Neuro: Alert and oriented X  3. Moves all extremities spontaneously. Psych: Normal affect. HEENT:  Normal  Neck: Supple without bruits or JVD. Lungs:  Resp regular and unlabored, CTA. Heart: Irregularly irregular. no s3, s4, or murmurs. Abdomen: Soft, non-tender, non-distended, BS + x 4.  Extremities: Trace edema  Accessory Clinical Findings  CBC No results for input(s): WBC, NEUTROABS, HGB, HCT, MCV, PLT in the last 72 hours. Basic Metabolic Panel  Recent Labs  09/21/14 0405 09/22/14 0550  NA 136 137  K 3.9 3.4*  CL 94* 94*  CO2 35* 31  GLUCOSE 180* 118*  BUN 18 17  CREATININE 1.50* 1.36*  CALCIUM 8.6 8.7   Liver Function Tests No results for input(s): AST, ALT, ALKPHOS, BILITOT, PROT, ALBUMIN in the last 72 hours. No results for input(s): LIPASE, AMYLASE in the last 72 hours. Cardiac Enzymes No results for input(s): CKTOTAL, CKMB, CKMBINDEX, TROPONINI in the last 72 hours. BNP Invalid input(s): POCBNP D-Dimer No results for input(s): DDIMER in the last 72 hours. Hemoglobin A1C No results for input(s): HGBA1C in the last 72 hours. Fasting Lipid Panel No results for input(s): CHOL, HDL, LDLCALC, TRIG, CHOLHDL, LDLDIRECT in the last 72 hours. Thyroid Function Tests No results for input(s): TSH, T4TOTAL, T3FREE, THYROIDAB in the last 72 hours.  Invalid   input(s): FREET3  TELE  Atrial fibrillation with controlled ventricular response  ECG     Radiology/Studies  Dg Chest 2 View  09/20/2014   CLINICAL DATA:  Short of breath  EXAM: CHEST  2 VIEW  COMPARISON:  02/12/2014  FINDINGS: Cardiac enlargement without heart failure. Lung volume is normal. Prominent lung markings bilaterally are unchanged and likely represent chronic lung disease. Probable interstitial scarring. Negative for pneumonia or effusion.  IMPRESSION: Chronic lung disease is stable.  No superimposed acute abnormality.   Electronically Signed   By: Marlan Palau M.D.   On: 09/20/2014 08:18    ASSESSMENT AND PLAN  1.  Paroxysmal A-fib with RVR  - HR better controlled metoprolol to 25mg  BID - CHA2DS2- VASC score 3 (HTN, DM, HF) - TEE/DCCV on Monday if remain a-fib INR 3.13  2. Acute on chronic diastolic HF - Weight is coming down. - likely occurred in the setting of rapid a-fib and also run out of lasix for several days - Echo 12/2013 EF 60-65%, no RWMA, grade 1 diastolic dysfunction. Cannot r/o small oscillating density in aortic valve?  Check question of aortic valve oscillating density at time of TEE cardioversion BMET today pending.  Continue IV Lasix and potassium supplementation  3. Acute respiratory distress - improved 4. CAD - PCI of the RCA in 2012 with DES 5. DM 6. HTN 7. Morbid obesity  Signed, 2013 MD

## 2014-09-24 ENCOUNTER — Ambulatory Visit: Payer: Self-pay | Admitting: Cardiology

## 2014-09-24 ENCOUNTER — Encounter (HOSPITAL_COMMUNITY): Payer: Self-pay | Admitting: Cardiology

## 2014-09-24 LAB — GLUCOSE, CAPILLARY
GLUCOSE-CAPILLARY: 155 mg/dL — AB (ref 70–99)
Glucose-Capillary: 290 mg/dL — ABNORMAL HIGH (ref 70–99)

## 2014-09-24 LAB — PROTIME-INR
INR: 2.5 — AB (ref 0.00–1.49)
Prothrombin Time: 27.2 seconds — ABNORMAL HIGH (ref 11.6–15.2)

## 2014-09-24 MED ORDER — METOPROLOL TARTRATE 25 MG PO TABS: 25.0000 mg | ORAL_TABLET | Freq: Two times a day (BID) | ORAL | Status: DC

## 2014-09-24 MED ORDER — FUROSEMIDE 80 MG PO TABS
80.0000 mg | ORAL_TABLET | Freq: Two times a day (BID) | ORAL | Status: DC
  Administered 2014-09-24: 80 mg via ORAL
  Filled 2014-09-24 (×3): qty 1

## 2014-09-24 MED ORDER — FUROSEMIDE 80 MG PO TABS: 80.0000 mg | ORAL_TABLET | Freq: Two times a day (BID) | ORAL | Status: DC

## 2014-09-24 NOTE — Progress Notes (Signed)
SATURATION QUALIFICATIONS: (This note is used to comply with regulatory documentation for home oxygen)  Patient Saturations on Room Air at Rest = 93%  Patient Saturations on Room Air while Ambulating = 88%   Patient Saturations on 2 Liters of oxygen while Ambulating = 92%  Please briefly explain why patient needs home oxygen:patient desaturations with very minimal ambulation, will benefit from supplemental O2.   [name], PT DPT  [phone]

## 2014-09-24 NOTE — Evaluation (Addendum)
Physical Therapy Evaluation Patient Details Name: [name] MRN: [id number] DOB: 12/17/1950 Today's Date: 09/24/2014   History of Present Illness  patient is a 64 yo male admitted for CHF exacerbation and afib with AVR.  Clinical Impression  Patient demonstrates deficits in functional mobility as indicated below. Will need continued skilled PT to address deficits and maximize function. Will see as indicated and progress as tolerated. OF NOTE: Patient on 2 liters at rest saturating at 95%, on room air decreased to 93%, ambulated on room air dropped to 88%, improved to 92% with 2 liters. Please see O2 saturation qualification.    Follow Up Recommendations No PT follow up    Equipment Recommendations  None recommended by PT    Recommendations for Other Services       Precautions / Restrictions        Mobility  Bed Mobility               General bed mobility comments: received in chair  Transfers Overall transfer level: Independent                  Ambulation/Gait Ambulation/Gait assistance: Supervision Ambulation Distance (Feet): 60 Feet Assistive device: None Gait Pattern/deviations: Step-through pattern;Decreased stride length;Wide base of support Gait velocity: decreased Gait velocity interpretation: Below normal speed for age/gender General Gait Details: Patient with pain in back with ambulation  Stairs            Wheelchair Mobility    Modified Rankin (Stroke Patients Only)       Balance                                             Pertinent Vitals/Pain Pain Assessment: 0-10 Pain Score: 9  Pain Location: back pain Pain Descriptors / Indicators: Aching;Constant;Discomfort Pain Intervention(s): Limited activity within patient's tolerance;Monitored during session;Repositioned;Patient requesting pain meds-RN notified    Home Living Family/patient expects to be discharged to:: Private residence Living Arrangements:  Other relatives Available Help at Discharge: Family;Available PRN/intermittently Type of Home: House Home Access: Stairs to enter Entrance Stairs-Rails: Right Entrance Stairs-Number of Steps: 3 Home Layout: One level Home Equipment: None Additional Comments: pt lives with his sister, they keep an eye on each other. Drives, does his own shopping and self care    Prior Function Level of Independence: Independent               Hand Dominance        Extremity/Trunk Assessment               Lower Extremity Assessment: Overall WFL for tasks assessed (history of BLE peripheral neuropathy)         Communication   Communication: No difficulties  Cognition Arousal/Alertness: Awake/alert Behavior During Therapy: WFL for tasks assessed/performed Overall Cognitive Status: Within Functional Limits for tasks assessed                      General Comments      Exercises        Assessment/Plan    PT Assessment Patient needs continued PT services  PT Diagnosis Difficulty walking;Acute pain   PT Problem List Decreased activity tolerance;Decreased mobility;Cardiopulmonary status limiting activity;Obesity;Pain  PT Treatment Interventions DME instruction;Gait training;Functional mobility training;Therapeutic activities;Therapeutic exercise;Balance training;Patient/family education   PT Goals (Current goals can be found in the Care Plan section) Acute  Rehab PT Goals Patient Stated Goal: to go home PT Goal Formulation: With patient Time For Goal Achievement: 10/08/14 Potential to Achieve Goals: Good    Frequency Min 3X/week   Barriers to discharge        Co-evaluation               End of Session Equipment Utilized During Treatment: Oxygen Activity Tolerance: Patient limited by fatigue Patient left: in chair;with call bell/phone within reach Nurse Communication: Mobility status         Time: 4034-7425 PT Time Calculation (min) (ACUTE ONLY): 18  min   Charges:   PT Evaluation $Initial PT Evaluation Tier I: 1 Procedure     PT G CodesFabio Asa 2014/10/17, 10:00 AM [name], PT DPT  [phone]

## 2014-09-24 NOTE — Discharge Summary (Signed)
Discharge Summary   Patient ID: [name],  MRN: [id number], DOB/AGE: March 05, 1951 64 y.o.  Admit date: 09/19/2014 Discharge date: 09/24/2014  Primary Care Provider: [name] Primary Cardiologist: Dr. [name]  Discharge Diagnoses Principal Problem:   Acute on chronic diastolic heart failure Active Problems:   Diabetes mellitus type 2, uncontrolled, with complications   Hyperlipidemia   OBESITY-MORBID BMI 54   Essential hypertension, benign   CELLULITIS AND ABSCESS OF LEG EXCEPT FOOT   Obstructive sleep apnea- unable to tolerate c-pap   Coronary Artery Disease   Chronic anticoagulation- Coumadin   Atrial fibrillation with RVR   Allergies No Known Allergies  Procedures  Transesophageal echocardiogram 09/23/2014. LV EF: 55% -  60%  ------------------------------------------------------------------- History:  PMH:  Atrial fibrillation.  ------------------------------------------------------------------- Study Conclusions  - Left ventricle: Systolic function was normal. The estimated ejection fraction was in the range of 55% to 60%. Wall motion was normal; there were no regional wall motion abnormalities. - Aortic valve: No evidence of vegetation. - Mitral valve: No evidence of vegetation. There was mild regurgitation. - Left atrium: The atrium was mildly dilated. No evidence of thrombus in the atrial cavity or appendage. - Right ventricle: The cavity size was moderately dilated. - Right atrium: The atrium was moderately dilated. - Atrial septum: The septum bowed from right to left, consistent with increased right atrial pressure. No defect or patent foramen ovale was identified. - Tricuspid valve: No evidence of vegetation. There was moderate regurgitation.  Impressions:  - Successful cardioversion.   DC cardioversion 09/23/2014 See full TEE report in [facility]; normal LV function; no LAA thrombus; patient subsequently sedated by  anesthesia with diprovan 140 mg total IV; synchronized DCCV with 120 J resulted in NSR; no immediate complications; continue coumadin.     Hospital Course  The patient is a 63 year old male with morbid obesity, history of paroxysmal atrial fibrillation and CAD. He has a history of obstructive sleep apnea, however noncompliant with CPAP. He had previous cardioversion back in 2010. He received a PCI to RCA in 2012. Echocardiogram obtained in June 2015 showed EF 60-65% with grade 1 diastolic dysfunction. He underwent cardiac catheterization in 01/2014 resulting in subsequent significant contrast nephropathy and congestive heart failure.  Patient was seen in the clinic on 09/19/2014 at which time he was noted to be tachypneic, tachycardic and hypoxic. His weight has increased by 19 pounds since last office visit in September. He has been having significant acute shortness of breath for 3 weeks with wheezing. EKG revealed atrial fibrillation with RVR with heart rate 109. He was subsequently admitted to [facility] for IV diuresis. He was initially placed on IV diltiazem. Diltiazem was discontinued on the following day on 3/11, his metoprolol was increased to 25 mg twice a day. He was placed on Coumadin. He was diuresed aggressively for the following 3 days. Per patient, he also ran out of home Lasix several days prior to admission. He underwent a TEE cardioversion on 09/23/2014 which showed EF 55-60%, no evidence of vegetation or LV thrombus, moderate TR, mild MR. He was subsequently shocked with 120 J resulting in normal sinus rhythm. There was no significant complication post procedure.  He was seen in the morning of 09/24/2014, at which time, patient is feeling better. EKG shows that he is maintaining normal sinus rhythm. He continued to be dyspneic, he ambulated and his O2 sat drop to 88% walking from bed to the door of his room. He qualify for home O2. He is deemed stable  for discharge from cardiology  perspective after obtaining home O2. I will arrange follow-up in 2-4 weeks with Dr. [name] in the office.  Of note, during this hospitalization, his amlodipine was discontinued and his metoprolol was increased to 25mg  BID. His current discharge weight and dry weight is 396 lbs  Discharge Vitals Blood pressure 143/60, pulse 91, temperature 98.1 F (36.7 C), temperature source Oral, resp. rate 20, height 6' (1.829 m), weight 396 lb 6.4 oz (179.806 kg), SpO2 99 %.  Filed Weights   09/22/14 0544 09/23/14 0509 09/24/14 0539  Weight: 402 lb 5.4 oz (182.5 kg) 400 lb 11.2 oz (181.756 kg) 396 lb 6.4 oz (179.806 kg)    Labs  CBC No results for input(s): WBC, NEUTROABS, HGB, HCT, MCV, PLT in the last 72 hours. Basic Metabolic Panel  Recent Labs  09/22/14 0550 09/23/14 0909  NA 137 138  K 3.4* 3.7  CL 94* 96  CO2 31 30  GLUCOSE 118* 79  BUN 17 18  CREATININE 1.36* 1.40*  CALCIUM 8.7 9.1   Disposition  Pt is being discharged home today in good condition.  Follow-up Plans & Appointments      Follow-up Information    Follow up with 09/25/14, PA-C On 10/22/2014.   Specialty:  Cardiology   Why:  2:00pm   Contact information:   [address] [place] [phone]       Follow up with [facility] On 10/17/2014.   Specialty:  Cardiology   Why:  Coumadin check. 3:00pm   Contact information:   [address] [place] [phone]      Discharge Medications    Medication List    STOP taking these medications        amLODipine 10 MG tablet  Commonly known as:  NORVASC      TAKE these medications        ACCU-CHEK AVIVA PLUS test strip  Generic drug:  glucose blood     ACCU-CHEK FASTCLIX LANCETS Misc     BD INSULIN SYRINGE ULTRAFINE 31G X 15/64" 0.3 ML Misc  Generic drug:  Insulin Syringe-Needle U-100     DIADERM FOOT REJUVENATING Crea  Apply 1 application topically  daily as needed (dry skin).     furosemide 80 MG tablet  Commonly known as:  LASIX  Take 1 tablet (80 mg total) by mouth 2 (two) times daily.     gabapentin 300 MG capsule  Commonly known as:  NEURONTIN  Take 1,200 mg by mouth 2 (two) times daily.     HUMULIN R 500 UNIT/ML Soln injection  Generic drug:  insulin regular human CONCENTRATED  Inject 90-120 Units into the skin 3 (three) times daily with meals.     Iron 325 (65 FE) MG Tabs  Take 1 tablet by mouth daily.     metoprolol tartrate 25 MG tablet  Commonly known as:  LOPRESSOR  Take 1 tablet (25 mg total) by mouth 2 (two) times daily.     multivitamin with minerals Tabs tablet  Take 1 tablet by mouth daily.     NATURAL BALANCE TEARS OP  Place 1 drop into both eyes daily as needed.     oxycodone 30 MG immediate release tablet  Commonly known as:  ROXICODONE  Take 30 mg by mouth 4 (four) times daily.     potassium chloride SA 20 MEQ tablet  Commonly known as:  KLOR-CON M20  Take 1 tablet (20 mEq total) by mouth daily.     rosuvastatin 40 MG tablet  Commonly known as:  CRESTOR  Take 40 mg by mouth daily.     spironolactone 25 MG tablet  Commonly known as:  ALDACTONE  Take 1 tablet (25 mg total) by mouth daily.     TRULICITY 0.75 MG/0.5ML Sopn  Generic drug:  Dulaglutide  Take 1 tablet by mouth once a week.     warfarin 5 MG tablet  Commonly known as:  COUMADIN  Take as directed by coumadin clinic        Outstanding Labs/Studies  Followup with Coumadin clinic  Duration of Discharge Encounter   Greater than 30 minutes including physician time.  Ramond Dial PA-C Pager: [phone] 09/24/2014, 1:29 PM

## 2014-09-24 NOTE — Progress Notes (Signed)
Patient Name: [name] Date of Encounter: 09/24/2014     Principal Problem:   Acute on chronic diastolic heart failure Active Problems:   Diabetes mellitus type 2, uncontrolled, with complications   Hyperlipidemia   OBESITY-MORBID BMI 54   Essential hypertension, benign   CELLULITIS AND ABSCESS OF LEG EXCEPT FOOT   Obstructive sleep apnea- unable to tolerate c-pap   Coronary Artery Disease   Chronic anticoagulation- Coumadin   Atrial fibrillation with RVR    SUBJECTIVE  The patient underwent successful TEE cardioversion yesterday.  No vegetations were seen.  There were no intracardiac thrombi.  EKG today confirms that he is remaining in sinus rhythm.  He feels better.  CURRENT MEDS . furosemide  80 mg Intravenous BID  . gabapentin  1,200 mg Oral Daily  . insulin regular human CONCENTRATED  70 Units Subcutaneous TID WC  . metoprolol tartrate  25 mg Oral BID  . potassium chloride SA  20 mEq Oral Daily  . rosuvastatin  40 mg Oral q1800  . sodium chloride  3 mL Intravenous Q12H  . spironolactone  25 mg Oral Daily  . Warfarin - Pharmacist Dosing Inpatient   Does not apply q1800    OBJECTIVE  Filed Vitals:   09/23/14 1400 09/23/14 1443 09/23/14 2106 09/24/14 0539  BP: 107/54 129/65 123/51 123/49  Pulse: 79 88 85 75  Temp:  98.2 F (36.8 C) 97.4 F (36.3 C) 98.1 F (36.7 C)  TempSrc:  Oral Oral Oral  Resp: 15 18 18 20   Height:      Weight:    396 lb 6.4 oz (179.806 kg)  SpO2: 100% 98% 98% 99%    Intake/Output Summary (Last 24 hours) at 09/24/14 0856 Last data filed at 09/24/14 09/26/14  Gross per 24 hour  Intake   2110 ml  Output   3600 ml  Net  -1490 ml   Filed Weights   09/22/14 0544 09/23/14 0509 09/24/14 0539  Weight: 402 lb 5.4 oz (182.5 kg) 400 lb 11.2 oz (181.756 kg) 396 lb 6.4 oz (179.806 kg)    PHYSICAL EXAM  General: Pleasant, NAD.  Morbid obesity Neuro: Alert and oriented X 3. Moves all extremities spontaneously. Psych: Normal  affect. HEENT:  Normal  Neck: Supple without bruits or JVD. Lungs:  Resp regular and unlabored, CTA. Heart: RRR no s3, s4, or murmurs. Abdomen: Soft, non-tender, non-distended, BS + x 4.  Extremities: Trace edema  Accessory Clinical Findings  CBC No results for input(s): WBC, NEUTROABS, HGB, HCT, MCV, PLT in the last 72 hours. Basic Metabolic Panel  Recent Labs  09/22/14 0550 09/23/14 0909  NA 137 138  K 3.4* 3.7  CL 94* 96  CO2 31 30  GLUCOSE 118* 79  BUN 17 18  CREATININE 1.36* 1.40*  CALCIUM 8.7 9.1   Liver Function Tests No results for input(s): AST, ALT, ALKPHOS, BILITOT, PROT, ALBUMIN in the last 72 hours. No results for input(s): LIPASE, AMYLASE in the last 72 hours. Cardiac Enzymes No results for input(s): CKTOTAL, CKMB, CKMBINDEX, TROPONINI in the last 72 hours. BNP Invalid input(s): POCBNP D-Dimer No results for input(s): DDIMER in the last 72 hours. Hemoglobin A1C No results for input(s): HGBA1C in the last 72 hours. Fasting Lipid Panel No results for input(s): CHOL, HDL, LDLCALC, TRIG, CHOLHDL, LDLDIRECT in the last 72 hours. Thyroid Function Tests No results for input(s): TSH, T4TOTAL, T3FREE, THYROIDAB in the last 72 hours.  Invalid input(s): FREET3  TELE  Normal sinus rhythm  ECG  Normal sinus rhythm  Radiology/Studies  Dg Chest 2 View  09/20/2014   CLINICAL DATA:  Short of breath  EXAM: CHEST  2 VIEW  COMPARISON:  02/12/2014  FINDINGS: Cardiac enlargement without heart failure. Lung volume is normal. Prominent lung markings bilaterally are unchanged and likely represent chronic lung disease. Probable interstitial scarring. Negative for pneumonia or effusion.  IMPRESSION: Chronic lung disease is stable.  No superimposed acute abnormality.   Electronically Signed   By: Marlan Palau M.D.   On: 09/20/2014 08:18    ASSESSMENT AND PLAN  1. Paroxysmal A-fib with RVR  - HR better controlled metoprolol to 25mg  BID -  CHA2DS2- VASC score 3 (HTN, DM, HF) -Successful TEE cardioversion yesterday.  INR is therapeutic  2. Acute on chronic diastolic HF Good diuresis.  Weight is down further.  Dyspnea is improved. 3. Acute respiratory distress - improved 4. CAD - PCI of the RCA in 2012 with DES 5. DM 6. HTN 7. Morbid obesity  Plan: Discharge today.  Follow-up with Dr. 2013.  Oxygen levels are low with activity.  We will try to arrange for home oxygen. Signed, [name] MD

## 2014-10-02 ENCOUNTER — Ambulatory Visit: Payer: Medicaid Other | Admitting: Podiatry

## 2014-10-05 ENCOUNTER — Other Ambulatory Visit: Payer: Self-pay | Admitting: Cardiology

## 2014-10-14 ENCOUNTER — Encounter: Payer: Self-pay | Admitting: Podiatry

## 2014-10-14 ENCOUNTER — Ambulatory Visit (INDEPENDENT_AMBULATORY_CARE_PROVIDER_SITE_OTHER): Payer: Medicare Other | Admitting: Podiatry

## 2014-10-14 DIAGNOSIS — M79676 Pain in unspecified toe(s): Secondary | ICD-10-CM

## 2014-10-14 DIAGNOSIS — B351 Tinea unguium: Secondary | ICD-10-CM

## 2014-10-15 NOTE — Progress Notes (Signed)
Patient ID: [name], male   DOB: 12-May-1951, 64 y.o.   MRN: [id number]  Subjective: This patient presents again complaining of painful toenails and requesting nail debridement. He says he did in the hospital to treat atrial fibrillation since his last visit of 06/19/2014  Objective: The toenails are elongated, hypertrophic, discolored, incurvated and tender direct palpation  Assessment: Symptomatic onychomycoses 6-10  Plan: Debridement of toenails 10 without any bleeding  Reappoint 3 months

## 2014-10-17 ENCOUNTER — Ambulatory Visit (INDEPENDENT_AMBULATORY_CARE_PROVIDER_SITE_OTHER): Payer: Medicare Other | Admitting: *Deleted

## 2014-10-17 DIAGNOSIS — Z5181 Encounter for therapeutic drug level monitoring: Secondary | ICD-10-CM

## 2014-10-17 DIAGNOSIS — I4891 Unspecified atrial fibrillation: Secondary | ICD-10-CM

## 2014-10-17 LAB — POCT INR: INR: 2.1

## 2014-10-22 ENCOUNTER — Ambulatory Visit: Payer: Medicare Other | Admitting: Cardiology

## 2014-11-13 ENCOUNTER — Ambulatory Visit (INDEPENDENT_AMBULATORY_CARE_PROVIDER_SITE_OTHER): Payer: Medicare Other | Admitting: *Deleted

## 2014-11-13 DIAGNOSIS — Z5181 Encounter for therapeutic drug level monitoring: Secondary | ICD-10-CM | POA: Diagnosis not present

## 2014-11-13 DIAGNOSIS — I4891 Unspecified atrial fibrillation: Secondary | ICD-10-CM | POA: Diagnosis not present

## 2014-11-13 LAB — POCT INR: INR: 2.8

## 2014-11-14 ENCOUNTER — Telehealth: Payer: Self-pay | Admitting: *Deleted

## 2014-11-14 NOTE — Telephone Encounter (Signed)
Spoke with Shanda Bumps at [facility] to confirm that pt does not need to hold coumadin for dental extraction of multiple teeth and that INR needs to be less than 3.5 and she states this is correct. Informed that pt will have INR checked on May 18th and she asks that we fax results to her office Fax number is [phone] and phone is [phone]

## 2014-11-27 ENCOUNTER — Telehealth: Payer: Self-pay | Admitting: *Deleted

## 2014-11-27 ENCOUNTER — Emergency Department (HOSPITAL_COMMUNITY)
Admission: EM | Admit: 2014-11-27 | Discharge: 2014-11-27 | Disposition: A | Discharge status: Home / Self Care | Payer: Medicare Other | Attending: Emergency Medicine | Admitting: Emergency Medicine

## 2014-11-27 ENCOUNTER — Encounter (HOSPITAL_COMMUNITY): Payer: Self-pay | Admitting: Emergency Medicine

## 2014-11-27 ENCOUNTER — Emergency Department (HOSPITAL_COMMUNITY): Payer: Medicare Other

## 2014-11-27 DIAGNOSIS — Y998 Other external cause status: Secondary | ICD-10-CM | POA: Diagnosis not present

## 2014-11-27 DIAGNOSIS — Z7901 Long term (current) use of anticoagulants: Secondary | ICD-10-CM | POA: Diagnosis not present

## 2014-11-27 DIAGNOSIS — S8002XA Contusion of left knee, initial encounter: Secondary | ICD-10-CM | POA: Diagnosis not present

## 2014-11-27 DIAGNOSIS — N289 Disorder of kidney and ureter, unspecified: Secondary | ICD-10-CM

## 2014-11-27 DIAGNOSIS — Z79899 Other long term (current) drug therapy: Secondary | ICD-10-CM | POA: Insufficient documentation

## 2014-11-27 DIAGNOSIS — E780 Pure hypercholesterolemia: Secondary | ICD-10-CM | POA: Insufficient documentation

## 2014-11-27 DIAGNOSIS — M1990 Unspecified osteoarthritis, unspecified site: Secondary | ICD-10-CM | POA: Insufficient documentation

## 2014-11-27 DIAGNOSIS — R609 Edema, unspecified: Secondary | ICD-10-CM

## 2014-11-27 DIAGNOSIS — Z794 Long term (current) use of insulin: Secondary | ICD-10-CM | POA: Insufficient documentation

## 2014-11-27 DIAGNOSIS — Y92009 Unspecified place in unspecified non-institutional (private) residence as the place of occurrence of the external cause: Secondary | ICD-10-CM | POA: Diagnosis not present

## 2014-11-27 DIAGNOSIS — S8992XA Unspecified injury of left lower leg, initial encounter: Secondary | ICD-10-CM | POA: Diagnosis present

## 2014-11-27 DIAGNOSIS — I252 Old myocardial infarction: Secondary | ICD-10-CM | POA: Diagnosis not present

## 2014-11-27 DIAGNOSIS — Z951 Presence of aortocoronary bypass graft: Secondary | ICD-10-CM | POA: Diagnosis not present

## 2014-11-27 DIAGNOSIS — I2510 Atherosclerotic heart disease of native coronary artery without angina pectoris: Secondary | ICD-10-CM | POA: Diagnosis not present

## 2014-11-27 DIAGNOSIS — N50819 Testicular pain, unspecified: Secondary | ICD-10-CM

## 2014-11-27 DIAGNOSIS — N5089 Other specified disorders of the male genital organs: Secondary | ICD-10-CM

## 2014-11-27 DIAGNOSIS — G8929 Other chronic pain: Secondary | ICD-10-CM | POA: Insufficient documentation

## 2014-11-27 DIAGNOSIS — I129 Hypertensive chronic kidney disease with stage 1 through stage 4 chronic kidney disease, or unspecified chronic kidney disease: Secondary | ICD-10-CM | POA: Diagnosis not present

## 2014-11-27 DIAGNOSIS — D649 Anemia, unspecified: Secondary | ICD-10-CM | POA: Diagnosis not present

## 2014-11-27 DIAGNOSIS — I5030 Unspecified diastolic (congestive) heart failure: Secondary | ICD-10-CM | POA: Diagnosis not present

## 2014-11-27 DIAGNOSIS — W1839XA Other fall on same level, initial encounter: Secondary | ICD-10-CM | POA: Insufficient documentation

## 2014-11-27 DIAGNOSIS — E162 Hypoglycemia, unspecified: Secondary | ICD-10-CM

## 2014-11-27 DIAGNOSIS — N508 Other specified disorders of male genital organs: Secondary | ICD-10-CM | POA: Insufficient documentation

## 2014-11-27 DIAGNOSIS — E11649 Type 2 diabetes mellitus with hypoglycemia without coma: Secondary | ICD-10-CM | POA: Diagnosis not present

## 2014-11-27 DIAGNOSIS — Z87891 Personal history of nicotine dependence: Secondary | ICD-10-CM | POA: Diagnosis not present

## 2014-11-27 DIAGNOSIS — Z872 Personal history of diseases of the skin and subcutaneous tissue: Secondary | ICD-10-CM | POA: Insufficient documentation

## 2014-11-27 DIAGNOSIS — Y9389 Activity, other specified: Secondary | ICD-10-CM | POA: Diagnosis not present

## 2014-11-27 DIAGNOSIS — N183 Chronic kidney disease, stage 3 (moderate): Secondary | ICD-10-CM | POA: Insufficient documentation

## 2014-11-27 DIAGNOSIS — R600 Localized edema: Secondary | ICD-10-CM

## 2014-11-27 DIAGNOSIS — W19XXXA Unspecified fall, initial encounter: Secondary | ICD-10-CM

## 2014-11-27 LAB — CBC WITH DIFFERENTIAL/PLATELET
BASOS ABS: 0 10*3/uL (ref 0.0–0.1)
Basophils Relative: 0 % (ref 0–1)
Eosinophils Absolute: 0.2 10*3/uL (ref 0.0–0.7)
Eosinophils Relative: 2 % (ref 0–5)
HEMATOCRIT: 36 % — AB (ref 39.0–52.0)
Hemoglobin: 11.1 g/dL — ABNORMAL LOW (ref 13.0–17.0)
Lymphocytes Relative: 15 % (ref 12–46)
Lymphs Abs: 2.1 10*3/uL (ref 0.7–4.0)
MCH: 23.1 pg — ABNORMAL LOW (ref 26.0–34.0)
MCHC: 30.8 g/dL (ref 30.0–36.0)
MCV: 75 fL — ABNORMAL LOW (ref 78.0–100.0)
MONO ABS: 1.1 10*3/uL — AB (ref 0.1–1.0)
Monocytes Relative: 8 % (ref 3–12)
NEUTROS ABS: 10.5 10*3/uL — AB (ref 1.7–7.7)
Neutrophils Relative %: 75 % (ref 43–77)
PLATELETS: 306 10*3/uL (ref 150–400)
RBC: 4.8 MIL/uL (ref 4.22–5.81)
RDW: 18.6 % — AB (ref 11.5–15.5)
WBC: 13.9 10*3/uL — AB (ref 4.0–10.5)

## 2014-11-27 LAB — URINALYSIS, ROUTINE W REFLEX MICROSCOPIC
BILIRUBIN URINE: NEGATIVE
GLUCOSE, UA: 250 mg/dL — AB
Hgb urine dipstick: NEGATIVE
Ketones, ur: 15 mg/dL — AB
LEUKOCYTES UA: NEGATIVE
Nitrite: NEGATIVE
PH: 6.5 (ref 5.0–8.0)
PROTEIN: 100 mg/dL — AB
Specific Gravity, Urine: 1.027 (ref 1.005–1.030)
Urobilinogen, UA: 1 mg/dL (ref 0.0–1.0)

## 2014-11-27 LAB — COMPREHENSIVE METABOLIC PANEL
ALK PHOS: 68 U/L (ref 38–126)
ALT: 18 U/L (ref 17–63)
AST: 27 U/L (ref 15–41)
Albumin: 2.9 g/dL — ABNORMAL LOW (ref 3.5–5.0)
Anion gap: 14 (ref 5–15)
BILIRUBIN TOTAL: 0.5 mg/dL (ref 0.3–1.2)
BUN: 9 mg/dL (ref 6–20)
CO2: 26 mmol/L (ref 22–32)
Calcium: 9 mg/dL (ref 8.9–10.3)
Chloride: 100 mmol/L — ABNORMAL LOW (ref 101–111)
Creatinine, Ser: 1.38 mg/dL — ABNORMAL HIGH (ref 0.61–1.24)
GFR calc Af Amer: >60 mL/min (ref >60)
GFR calc non Af Amer: 53 mL/min — ABNORMAL LOW (ref >60)
Glucose, Bld: 243 mg/dL — ABNORMAL HIGH (ref 65–99)
Potassium: 3.2 mmol/L — ABNORMAL LOW (ref 3.5–5.1)
SODIUM: 140 mmol/L (ref 135–145)
Total Protein: 6.5 g/dL (ref 6.5–8.1)

## 2014-11-27 LAB — URINE MICROSCOPIC-ADD ON

## 2014-11-27 LAB — CBG MONITORING, ED
GLUCOSE-CAPILLARY: 59 mg/dL — AB (ref 65–99)
Glucose-Capillary: 37 mg/dL — CL (ref 65–99)
Glucose-Capillary: 113 mg/dL — ABNORMAL HIGH (ref 65–99)

## 2014-11-27 MED ORDER — FUROSEMIDE 40 MG PO TABS: 40.0000 mg | ORAL_TABLET | Freq: Two times a day (BID) | ORAL | Status: DC

## 2014-11-27 MED ORDER — MORPHINE SULFATE 4 MG/ML IJ SOLN
4.0000 mg | Freq: Once | INTRAMUSCULAR | Status: AC
  Administered 2014-11-27: 4 mg via INTRAVENOUS
  Filled 2014-11-27: qty 1

## 2014-11-27 MED ORDER — POTASSIUM CHLORIDE CRYS ER 20 MEQ PO TBCR
40.0000 meq | EXTENDED_RELEASE_TABLET | Freq: Once | ORAL | Status: AC
  Administered 2014-11-27: 40 meq via ORAL
  Filled 2014-11-27: qty 2

## 2014-11-27 MED ORDER — POTASSIUM CHLORIDE CRYS ER 20 MEQ PO TBCR: 20.0000 meq | EXTENDED_RELEASE_TABLET | Freq: Two times a day (BID) | ORAL | Status: DC

## 2014-11-27 MED ORDER — FUROSEMIDE 10 MG/ML IJ SOLN
80.0000 mg | Freq: Once | INTRAMUSCULAR | Status: AC
  Administered 2014-11-27: 80 mg via INTRAMUSCULAR
  Filled 2014-11-27: qty 8

## 2014-11-27 NOTE — ED Notes (Signed)
The patient adivsed EMS that he had fallen yesterday.  He also said the reason he called is because he has swollen testicles and they are painful now due tio the fall.  The patient said he fell this morning because he thought his sugar dropped and he fell.  He put candy in his mouth and felt better.  He did say he has been having pain in his groin due to rubbing his scrotum, being swollen.  He also said his penis is drawn into his panis and he has to "pull it out" and use a cup to pee in it.  He rates his pain 8/10.

## 2014-11-27 NOTE — ED Provider Notes (Signed)
CSN: [id number]     Arrival date & time 11/27/14  0112 History   This chart was scribed for Dione Booze, MD by Abel Presto, ED Scribe. This patient was seen in room B16C/B16C and the patient's care was started at 1:59 AM.    Chief Complaint  Patient presents with  . Groin Swelling    The patient adivsed EMS that he had fallen yesterday.  He also said the reason he called is because he has swollen testicles and they are painful now due tio the fall.     The history is provided by the patient. No language interpreter was used.   HPI Comments: [name] is a 64 y.o. male brought in by ambulance, with PMHx of DM, HTN, HLD, diastolic CHF, AFib, anemia, and MI and CKD who presents to the Emergency Department complaining of fall yesterday morning.  He states he felt his blood sugar dropped and fell while in a squatting position.  He notes associated right knee pain. Pt also states "I may have hyperextended my left knee," and reports associated knee pain.  He also reports scrotal swelling, worse on left with onset 1 week ago. Pt states "it seems like my penis is drawn in and I have to pull it out and pee into a cup so it doesn't get all over me." He reports increased pain after fall. He denies fever and chills.   Past Medical History  Diagnosis Date  . Hypertension   . Diastolic congestive heart failure   . CAD (coronary artery disease)   . Atrial fibrillation     TEE DCCV 09/23/2014  . Anemia   . Pilonidal cyst 1980's; 01/25/2013  . Shortness of breath     with ambulation, with exertion  . Neuropathy   . History of blood transfusion ~ 1954    "related to OR"  . Carpal tunnel syndrome, bilateral   . Morbid obesity   . Myocardial infarction     pt stated he had a slient heart attack that showed up on nuclear stress.  . High cholesterol   . OSA (obstructive sleep apnea)     "I wear nasal prongs; haven't been using prongs recently" (09/19/2014)  . Type II diabetes mellitus   .  Arthritis     "hands and lower back" (09/19/2014)  . Chronic lower back pain   . Chronic kidney disease (CKD), stage III (moderate)    Past Surgical History  Procedure Laterality Date  . Abdominal surgery  ~ 1954    BENIGN TUMOR REMOVED  . Debridement  foot Left     debriding diabetic foot ulcers  . Cataract extraction w/phaco Right 11/15/2012    Procedure: CATARACT EXTRACTION PHACO AND INTRAOCULAR LENS PLACEMENT (IOC);  Surgeon: Shade Flood, MD;  Location: [facility] OR;  Service: Ophthalmology;  Laterality: Right;  . Cataract extraction w/phaco Left 11/29/2012    Procedure: CATARACT EXTRACTION PHACO AND INTRAOCULAR LENS PLACEMENT (IOC);  Surgeon: Shade Flood, MD;  Location: [facility] OR;  Service: Ophthalmology;  Laterality: Left;  . Pilonidal cyst excision N/A 01/08/2013    Procedure: CYST EXCISION PILONIDAL EXTENSIVE;  Surgeon: Axel Filler, MD;  Location: [facility] OR;  Service: General;  Laterality: N/A;  . Foreign body removal Right 2014    heel,  splinter removal   . Pars plana vitrectomy Left 06/05/2013    Procedure: PARS PLANA VITRECTOMY WITH 23 GAUGE with Endolaser(constellation);  Surgeon: Shade Flood, MD;  Location: [facility] OR;  Service: Ophthalmology;  Laterality: Left;  .  Left and right heart catheterization with coronary angiogram N/A 01/31/2014    Procedure: LEFT AND RIGHT HEART CATHETERIZATION WITH CORONARY ANGIOGRAM;  Surgeon: Micheline Chapman, MD;  Location: [facility] CATH LAB;  Service: Cardiovascular;  Laterality: N/A;  . Tonsillectomy    . Appendectomy    . Eye surgery    . Coronary angioplasty with stent placement  August 2012    RCA DES - [facility]  . Cardiac catheterization  01/2014  . Cardioversion  2010    [name] 09/19/2014  . Pilonidal cyst excision  1980's    "in [place]"  . Tee without cardioversion N/A 09/23/2014    Procedure: TRANSESOPHAGEAL ECHOCARDIOGRAM (TEE);  Surgeon: Lewayne Bunting, MD;  Location: [facility] ENDOSCOPY;  Service: Cardiovascular;  Laterality:  N/A;  . Cardioversion N/A 09/23/2014    Procedure: CARDIOVERSION;  Surgeon: Lewayne Bunting, MD;  Location: [facility] ENDOSCOPY;  Service: Cardiovascular;  Laterality: N/A;   Family History  Problem Relation Age of Onset  . Adopted: Yes  . Other Other     PT ADOPTED   History  Substance Use Topics  . Smoking status: Former Smoker -- 1.00 packs/day for 20 years    Types: Cigarettes    Quit date: 07/13/1983  . Smokeless tobacco: Never Used  . Alcohol Use: No    Review of Systems  Constitutional: Negative for fever and chills.  Genitourinary: Positive for scrotal swelling and testicular pain.  Musculoskeletal: Positive for myalgias and arthralgias.  All other systems reviewed and are negative.     Allergies  Review of patient's allergies indicates no known allergies.  Home Medications   Prior to Admission medications   Medication Sig Start Date End Date Taking? Authorizing Provider  ACCU-CHEK AVIVA PLUS test strip  08/30/14   Historical Provider, MD  ACCU-CHEK FASTCLIX LANCETS MISC  08/17/13   Historical Provider, MD  BD INSULIN SYRINGE ULTRAFINE 31G X 15/64" 0.3 ML MISC  08/19/14   Historical Provider, MD  Ferrous Sulfate (IRON) 325 (65 FE) MG TABS Take 1 tablet by mouth daily.    Historical Provider, MD  furosemide (LASIX) 80 MG tablet Take 1 tablet (80 mg total) by mouth 2 (two) times daily. 09/24/14   Azalee Course, PA  gabapentin (NEURONTIN) 300 MG capsule Take 1,200 mg by mouth 2 (two) times daily.     Historical Provider, MD  Hypromellose (NATURAL BALANCE TEARS OP) Place 1 drop into both eyes daily as needed.    Historical Provider, MD  insulin regular human CONCENTRATED (HUMULIN R) 500 UNIT/ML SOLN injection Inject 90-120 Units into the skin 3 (three) times daily with meals.     Historical Provider, MD  metoprolol tartrate (LOPRESSOR) 25 MG tablet Take 1 tablet (25 mg total) by mouth 2 (two) times daily. 09/24/14   Azalee Course, PA  Multiple Vitamin (MULTIVITAMIN WITH MINERALS) TABS Take 1  tablet by mouth daily.    Historical Provider, MD  oxycodone (ROXICODONE) 30 MG immediate release tablet Take 30 mg by mouth 4 (four) times daily.     Historical Provider, MD  Podiatric Products (DIADERM FOOT REJUVENATING) CREA Apply 1 application topically daily as needed (dry skin).    Historical Provider, MD  potassium chloride SA (KLOR-CON M20) 20 MEQ tablet Take 1 tablet (20 mEq total) by mouth daily. 10/07/14   Lewayne Bunting, MD  rosuvastatin (CRESTOR) 40 MG tablet Take 40 mg by mouth daily.    Historical Provider, MD  spironolactone (ALDACTONE) 25 MG tablet Take 1  tablet (25 mg total) by mouth daily. 09/18/14   Lewayne Bunting, MD  TRULICITY 0.75 MG/0.5ML SOPN Take 1 tablet by mouth once a week. 03/15/14   Historical Provider, MD  warfarin (COUMADIN) 5 MG tablet TAKE AS DIRECTED BY COUMADIN CLINIC 10/07/14   Lewayne Bunting, MD   BP 128/55 mmHg  Pulse 157  Temp(Src) 98 F (36.7 C) (Oral)  Resp 14  Ht 6' (1.829 m)  Wt 372 lb (168.738 kg)  BMI 50.44 kg/m2  SpO2 95% Physical Exam  Constitutional: He is oriented to person, place, and time. He appears well-developed and well-nourished.  obese  HENT:  Head: Normocephalic.  Eyes: Conjunctivae are normal. Pupils are equal, round, and reactive to light.  Neck: Normal range of motion. Neck supple. No JVD present.  Cardiovascular: Normal rate, regular rhythm and normal heart sounds.   No murmur heard. Pulmonary/Chest: Effort normal and breath sounds normal. He has no wheezes. He has no rales. He exhibits no tenderness.  Abdominal: Soft. Bowel sounds are normal. He exhibits no distension and no mass. There is no tenderness.  Genitourinary: Right testis is descended. Left testis shows swelling. Left testis shows no mass. Left testis is descended.  Soft tissue swelling of left scrotum without definite mass; suspicious for hydrocele  Musculoskeletal: Normal range of motion. He exhibits edema (1+).  Tenderness of both knees, left worse than  right Pain on passive ROM, left worse than right No instability or effusion  Lymphadenopathy:    He has no cervical adenopathy.  Neurological: He is alert and oriented to person, place, and time. No cranial nerve deficit. He exhibits normal muscle tone. Coordination normal.  Skin: Skin is warm and dry. No rash noted.  Psychiatric: He has a normal mood and affect. His behavior is normal. Judgment and thought content normal.  Nursing note and vitals reviewed.   ED Course  Procedures (including critical care time) DIAGNOSTIC STUDIES: Oxygen Saturation is 95% on room air, normal by my interpretation.    COORDINATION OF CARE: 2:11 AM Discussed treatment plan with patient at beside, the patient agrees with the plan and has no further questions at this time.   Labs Review Results for orders placed or performed during the hospital encounter of 11/27/14  Comprehensive metabolic panel  Result Value Ref Range   Sodium 140 135 - 145 mmol/L   Potassium 3.2 (L) 3.5 - 5.1 mmol/L   Chloride 100 (L) 101 - 111 mmol/L   CO2 26 22 - 32 mmol/L   Glucose, Bld 243 (H) 65 - 99 mg/dL   BUN 9 6 - 20 mg/dL   Creatinine, Ser 2.44 (H) 0.61 - 1.24 mg/dL   Calcium 9.0 8.9 - 01.0 mg/dL   Total Protein 6.5 6.5 - 8.1 g/dL   Albumin 2.9 (L) 3.5 - 5.0 g/dL   AST 27 15 - 41 U/L   ALT 18 17 - 63 U/L   Alkaline Phosphatase 68 38 - 126 U/L   Total Bilirubin 0.5 0.3 - 1.2 mg/dL   GFR calc non Af Amer 53 (L) >60 mL/min   GFR calc Af Amer >60 >60 mL/min   Anion gap 14 5 - 15  CBC with Differential  Result Value Ref Range   WBC 13.9 (H) 4.0 - 10.5 K/uL   RBC 4.80 4.22 - 5.81 MIL/uL   Hemoglobin 11.1 (L) 13.0 - 17.0 g/dL   HCT 27.2 (L) 53.6 - 64.4 %   MCV 75.0 (L) 78.0 - 100.0 fL  MCH 23.1 (L) 26.0 - 34.0 pg   MCHC 30.8 30.0 - 36.0 g/dL   RDW 16.1 (H) 09.6 - 04.5 %   Platelets 306 150 - 400 K/uL   Neutrophils Relative % 75 43 - 77 %   Neutro Abs 10.5 (H) 1.7 - 7.7 K/uL   Lymphocytes Relative 15 12 - 46 %    Lymphs Abs 2.1 0.7 - 4.0 K/uL   Monocytes Relative 8 3 - 12 %   Monocytes Absolute 1.1 (H) 0.1 - 1.0 K/uL   Eosinophils Relative 2 0 - 5 %   Eosinophils Absolute 0.2 0.0 - 0.7 K/uL   Basophils Relative 0 0 - 1 %   Basophils Absolute 0.0 0.0 - 0.1 K/uL  CBG monitoring, ED  Result Value Ref Range   Glucose-Capillary 37 (LL) 65 - 99 mg/dL  CBG monitoring, ED  Result Value Ref Range   Glucose-Capillary 59 (L) 65 - 99 mg/dL  CBG monitoring, ED  Result Value Ref Range   Glucose-Capillary 113 (H) 65 - 99 mg/dL    Imaging Review US Scrotum  11/27/2014   CLINICAL DATA:  Groin swelling. Fall yesterday, now with testicular pain and swelling.  EXAM: SCROTAL ULTRASOUND  DOPPLER ULTRASOUND OF THE TESTICLES  TECHNIQUE: Complete ultrasound examination of the testicles, epididymis, and other scrotal structures was performed. Color and spectral Doppler ultrasound were also utilized to evaluate blood flow to the testicles.  COMPARISON:  None.  FINDINGS: Right testicle  Measurements: 3.5 x 2.1 x 2.1 cm. No mass or microlithiasis visualized. No testicular hematoma. Homogeneous blood flow noted.  Left testicle  Measurements: 3.8 x 1.9 x 2.4 cm. No mass or microlithiasis visualized. No testicular hematoma. Homogeneous blood flow noted.  Right epididymis:  Normal where visualized, tail obscured.  Left epididymis:  Not well seen.  Hydrocele:  None visualized.  Varicocele:  None visualized.  Scrotal skin: Diffuse skin and subcutaneous edema is seen. No fluid collection. There is fat within both inguinal canals.  Pulsed Doppler interrogation of both testes demonstrates normal low resistance arterial and venous waveforms bilaterally.  IMPRESSION: 1. Diffuse skin and subcutaneous edema of the scrotum. No focal fluid collection. 2. Normal appearance of both testis.  Normal blood flow.   Electronically Signed   By: Rubye Oaks M.D.   On: 11/27/2014 03:01   [place] Art/ven Flow Abd Pelv Doppler  11/27/2014   CLINICAL DATA:   Groin swelling. Fall yesterday, now with testicular pain and swelling.  EXAM: SCROTAL ULTRASOUND  DOPPLER ULTRASOUND OF THE TESTICLES  TECHNIQUE: Complete ultrasound examination of the testicles, epididymis, and other scrotal structures was performed. Color and spectral Doppler ultrasound were also utilized to evaluate blood flow to the testicles.  COMPARISON:  None.  FINDINGS: Right testicle  Measurements: 3.5 x 2.1 x 2.1 cm. No mass or microlithiasis visualized. No testicular hematoma. Homogeneous blood flow noted.  Left testicle  Measurements: 3.8 x 1.9 x 2.4 cm. No mass or microlithiasis visualized. No testicular hematoma. Homogeneous blood flow noted.  Right epididymis:  Normal where visualized, tail obscured.  Left epididymis:  Not well seen.  Hydrocele:  None visualized.  Varicocele:  None visualized.  Scrotal skin: Diffuse skin and subcutaneous edema is seen. No fluid collection. There is fat within both inguinal canals.  Pulsed Doppler interrogation of both testes demonstrates normal low resistance arterial and venous waveforms bilaterally.  IMPRESSION: 1. Diffuse skin and subcutaneous edema of the scrotum. No focal fluid collection. 2. Normal appearance of both testis.  Normal  blood flow.   Electronically Signed   By: Rubye Oaks M.D.   On: 11/27/2014 03:01   Dg Knee Complete 4 Views Left  11/27/2014   CLINICAL DATA:  Status post fall, with left knee pain. Initial encounter.  EXAM: LEFT KNEE - COMPLETE 4+ VIEW  COMPARISON:  None.  FINDINGS: There is no evidence of fracture or dislocation. The joint spaces are preserved. Chondrocalcinosis is noted; the patellofemoral joint is grossly unremarkable in appearance. A fabella is seen.  No significant joint effusion is seen. The visualized soft tissues are normal in appearance.  IMPRESSION: 1. No evidence of fracture or dislocation. 2. Chondrocalcinosis noted.   Electronically Signed   By: Roanna Raider M.D.   On: 11/27/2014 03:05   Dg Knee Complete 4  Views Right  11/27/2014   CLINICAL DATA:  Status post fall; right knee pain. Initial encounter.  EXAM: RIGHT KNEE - COMPLETE 4+ VIEW  COMPARISON:  None.  FINDINGS: There is no evidence of fracture or dislocation. The joint spaces are preserved. There is narrowing of the patellofemoral compartment, with marginal osteophytes seen arising at all three compartments. Wall osteophytes are also noted. Underlying mild chondrocalcinosis seen. A fabella is noted.  No significant joint effusion is seen. Mild suprapatellar soft tissue swelling is suggested.  IMPRESSION: 1. No evidence of fracture or dislocation. 2. 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 3. Underlying mild chondrocalcinosis noted.   Electronically Signed   By: Roanna Raider M.D.   On: 11/27/2014 03:07     EKG Interpretation   Date/Time:  Wednesday Nov 27 2014 01:20:29 EDT Ventricular Rate:  123 PR Interval:    QRS Duration: 88 QT Interval:  389 QTC Calculation: 556 R Axis:   97 Text Interpretation:  Atrial fibrillation Ventricular premature complex  Right axis deviation Low voltage, precordial leads Nonspecific T  abnormalities, lateral leads Prolonged QT interval When compared with ECG  of 09/24/2014, QT has lengthened Confirmed by [facility]  MD, Shyna Duignan ([id number]) on  11/27/2014 1:34:14 AM      MDM   Final diagnoses:  Fall at home, initial encounter  Scrotal swelling  Peripheral edema  Renal insufficiency  Contusion of left knee, initial encounter  Hypoglycemia    Scrotal swelling which is suspicious for hydrocele. He is sent for ultrasound which actually just showed generalized edema and no hydrocele. Knee pain from fall without evidence of significant injury. X-rays are negative for fracture. After these tests have been completed, patient became hypoglycemic. Blood sugar did come up with oral food intake. Laboratory workup showed stable renal insufficiency and mild hypokalemia. He is  currently on a fairly high dose of furosemide-80 mg twice a day. This will be increased to 120 mg twice a day for the next 5 days and he is to follow-up with his PCP to decide whether he should be maintained on that level or cut back to 80 mg twice a day after the 5 days. His potassium is increased from once a day to twice a day. Since she is also taking spironolactone, this will have to be watched Verlon Au to make sure he does not become hyperkalemic.   I personally performed the services described in this documentation, which was scribed in my presence. The recorded information has been reviewed and is accurate.       Dione Booze, MD 11/27/14 [phone]

## 2014-11-27 NOTE — ED Notes (Signed)
Recheck pt CBG per nurse, 59. Nurse was notified.

## 2014-11-27 NOTE — ED Notes (Signed)
Recheck CBG, 119. Nurse was notified.

## 2014-11-27 NOTE — ED Notes (Signed)
Pt given taxi voucher to get home; pt left discharge instructions; pt called and notified and RX called into pharmacy

## 2014-11-27 NOTE — ED Notes (Signed)
Pt CBG, 37. Nurse was notified. 

## 2014-11-27 NOTE — ED Notes (Signed)
Gave orange juice and a sandwich to pt per nurse. Recheck pt CBG, in 15 mins.

## 2014-11-27 NOTE — ED Notes (Signed)
MD advised of patient's blood sugar.  Patient was given orange juice and a sandwich bag and MD advised to check his blood sugar in 15 minutes.

## 2014-11-27 NOTE — Telephone Encounter (Signed)
Pt called he want to R/s appt to next week since he moved dental appt to 12/05/14.

## 2014-11-27 NOTE — ED Notes (Signed)
MD at bedside. 

## 2014-11-27 NOTE — ED Notes (Signed)
Pt unable to produce urine at this time.   °

## 2014-11-27 NOTE — ED Notes (Signed)
Patient transported to Ultrasound and X-ray.

## 2014-11-27 NOTE — Discharge Instructions (Signed)
The swelling in your scrotum is all edema fluid. He need to be on a higher dose of furosemide (Lasix) to get the extra fluid off. I am giving you a prescription for furosemide 40 mg which yielded take along with your current prescription of furosemide 80 mg so that you take a total of 120 mg twice a day for the next 5 days. Follow-up with your primary care provider at that point to see if you need to be maintained on a higher dose of furosemide or whether it can be dropped back down to the current dose.  Here potassium level has come back low. He will need to be on a higher dose of potassium to keep the level where it should be. This will need to be watched very closely because one of the medications you take can actually raise her potassium. Your doctor may need to check the level periodically.  Edema Edema is an abnormal buildup of fluids in your bodytissues. Edema is somewhatdependent on gravity to pull the fluid to the lowest place in your body. That makes the condition more common in the legs and thighs (lower extremities). Painless swelling of the feet and ankles is common and becomes more likely as you get older. It is also common in looser tissues, like around your eyes.  When the affected area is squeezed, the fluid may move out of that spot and leave a dent for a few moments. This dent is called pitting.  CAUSES  There are many possible causes of edema. Eating too much salt and being on your feet or sitting for a long time can cause edema in your legs and ankles. Hot weather may make edema worse. Common medical causes of edema include:  Heart failure.  Liver disease.  Kidney disease.  Weak blood vessels in your legs.  Cancer.  An injury.  Pregnancy.  Some medications.  Obesity. SYMPTOMS  Edema is usually painless.Your skin may look swollen or shiny.  DIAGNOSIS  Your health care provider may be able to diagnose edema by asking about your medical history and doing a physical  exam. You may need to have tests such as X-rays, an electrocardiogram, or blood tests to check for medical conditions that may cause edema.  TREATMENT  Edema treatment depends on the cause. If you have heart, liver, or kidney disease, you need the treatment appropriate for these conditions. General treatment may include:  Elevation of the affected body part above the level of your heart.  Compression of the affected body part. Pressure from elastic bandages or support stockings squeezes the tissues and forces fluid back into the blood vessels. This keeps fluid from entering the tissues.  Restriction of fluid and salt intake.  Use of a water pill (diuretic). These medications are appropriate only for some types of edema. They pull fluid out of your body and make you urinate more often. This gets rid of fluid and reduces swelling, but diuretics can have side effects. Only use diuretics as directed by your health care provider. HOME CARE INSTRUCTIONS   Keep the affected body part above the level of your heart when you are lying down.   Do not sit still or stand for prolonged periods.   Do not put anything directly under your knees when lying down.  Do not wear constricting clothing or garters on your upper legs.   Exercise your legs to work the fluid back into your blood vessels. This may help the swelling go down.  Wear elastic bandages or support stockings to reduce ankle swelling as directed by your health care provider.   Eat a low-salt diet to reduce fluid if your health care provider recommends it.   Only take medicines as directed by your health care provider. SEEK MEDICAL CARE IF:   Your edema is not responding to treatment.  You have heart, liver, or kidney disease and notice symptoms of edema.  You have edema in your legs that does not improve after elevating them.   You have sudden and unexplained weight gain. SEEK IMMEDIATE MEDICAL CARE IF:   You develop  shortness of breath or chest pain.   You cannot breathe when you lie down.  You develop pain, redness, or warmth in the swollen areas.   You have heart, liver, or kidney disease and suddenly get edema.  You have a fever and your symptoms suddenly get worse. MAKE SURE YOU:   Understand these instructions.  Will watch your condition.  Will get help right away if you are not doing well or get worse. Document Released: 06/28/2005 Document Revised: 11/12/2013 Document Reviewed: 04/20/2013 [facility] Patient Information 2015 [place], [place]. This information is not intended to replace advice given to you by your health care provider. Make sure you discuss any questions you have with your health care provider.

## 2014-12-04 ENCOUNTER — Emergency Department (HOSPITAL_COMMUNITY): Payer: Medicare Other

## 2014-12-04 ENCOUNTER — Ambulatory Visit (INDEPENDENT_AMBULATORY_CARE_PROVIDER_SITE_OTHER): Payer: Medicare Other | Admitting: *Deleted

## 2014-12-04 ENCOUNTER — Encounter: Payer: Self-pay | Admitting: Physician Assistant

## 2014-12-04 ENCOUNTER — Encounter (HOSPITAL_COMMUNITY): Payer: Self-pay | Admitting: General Practice

## 2014-12-04 ENCOUNTER — Inpatient Hospital Stay (HOSPITAL_COMMUNITY)
Admission: EM | Admit: 2014-12-04 | Discharge: 2014-12-11 | DRG: 291 | Disposition: A | Discharge status: Home / Self Care | Payer: Medicare Other | Attending: Internal Medicine | Admitting: Internal Medicine

## 2014-12-04 ENCOUNTER — Ambulatory Visit (INDEPENDENT_AMBULATORY_CARE_PROVIDER_SITE_OTHER): Payer: Medicare Other | Admitting: Physician Assistant

## 2014-12-04 VITALS — BP 144/78 | HR 123 | Resp 22 | Ht 72.0 in | Wt 374.4 lb

## 2014-12-04 DIAGNOSIS — D509 Iron deficiency anemia, unspecified: Secondary | ICD-10-CM | POA: Diagnosis present

## 2014-12-04 DIAGNOSIS — K088 Other specified disorders of teeth and supporting structures: Secondary | ICD-10-CM | POA: Diagnosis not present

## 2014-12-04 DIAGNOSIS — N508 Other specified disorders of male genital organs: Secondary | ICD-10-CM | POA: Diagnosis present

## 2014-12-04 DIAGNOSIS — I480 Paroxysmal atrial fibrillation: Secondary | ICD-10-CM | POA: Diagnosis not present

## 2014-12-04 DIAGNOSIS — M25569 Pain in unspecified knee: Secondary | ICD-10-CM | POA: Diagnosis present

## 2014-12-04 DIAGNOSIS — M545 Low back pain: Secondary | ICD-10-CM | POA: Diagnosis present

## 2014-12-04 DIAGNOSIS — E1165 Type 2 diabetes mellitus with hyperglycemia: Secondary | ICD-10-CM | POA: Diagnosis present

## 2014-12-04 DIAGNOSIS — N183 Chronic kidney disease, stage 3 unspecified: Secondary | ICD-10-CM | POA: Diagnosis present

## 2014-12-04 DIAGNOSIS — R609 Edema, unspecified: Secondary | ICD-10-CM

## 2014-12-04 DIAGNOSIS — Z79891 Long term (current) use of opiate analgesic: Secondary | ICD-10-CM

## 2014-12-04 DIAGNOSIS — N5089 Other specified disorders of the male genital organs: Secondary | ICD-10-CM

## 2014-12-04 DIAGNOSIS — E785 Hyperlipidemia, unspecified: Secondary | ICD-10-CM | POA: Diagnosis present

## 2014-12-04 DIAGNOSIS — J9621 Acute and chronic respiratory failure with hypoxia: Secondary | ICD-10-CM

## 2014-12-04 DIAGNOSIS — E1122 Type 2 diabetes mellitus with diabetic chronic kidney disease: Secondary | ICD-10-CM | POA: Diagnosis present

## 2014-12-04 DIAGNOSIS — R0902 Hypoxemia: Secondary | ICD-10-CM | POA: Diagnosis present

## 2014-12-04 DIAGNOSIS — Z7901 Long term (current) use of anticoagulants: Secondary | ICD-10-CM

## 2014-12-04 DIAGNOSIS — G5601 Carpal tunnel syndrome, right upper limb: Secondary | ICD-10-CM | POA: Diagnosis present

## 2014-12-04 DIAGNOSIS — Z79899 Other long term (current) drug therapy: Secondary | ICD-10-CM

## 2014-12-04 DIAGNOSIS — Z955 Presence of coronary angioplasty implant and graft: Secondary | ICD-10-CM

## 2014-12-04 DIAGNOSIS — K0889 Other specified disorders of teeth and supporting structures: Secondary | ICD-10-CM

## 2014-12-04 DIAGNOSIS — I2510 Atherosclerotic heart disease of native coronary artery without angina pectoris: Secondary | ICD-10-CM | POA: Diagnosis present

## 2014-12-04 DIAGNOSIS — J9601 Acute respiratory failure with hypoxia: Secondary | ICD-10-CM | POA: Diagnosis present

## 2014-12-04 DIAGNOSIS — I4891 Unspecified atrial fibrillation: Secondary | ICD-10-CM | POA: Diagnosis not present

## 2014-12-04 DIAGNOSIS — I5032 Chronic diastolic (congestive) heart failure: Secondary | ICD-10-CM

## 2014-12-04 DIAGNOSIS — Z5181 Encounter for therapeutic drug level monitoring: Secondary | ICD-10-CM

## 2014-12-04 DIAGNOSIS — E1140 Type 2 diabetes mellitus with diabetic neuropathy, unspecified: Secondary | ICD-10-CM | POA: Diagnosis present

## 2014-12-04 DIAGNOSIS — G4733 Obstructive sleep apnea (adult) (pediatric): Secondary | ICD-10-CM

## 2014-12-04 DIAGNOSIS — I129 Hypertensive chronic kidney disease with stage 1 through stage 4 chronic kidney disease, or unspecified chronic kidney disease: Secondary | ICD-10-CM | POA: Diagnosis present

## 2014-12-04 DIAGNOSIS — I252 Old myocardial infarction: Secondary | ICD-10-CM

## 2014-12-04 DIAGNOSIS — G5602 Carpal tunnel syndrome, left upper limb: Secondary | ICD-10-CM | POA: Diagnosis present

## 2014-12-04 DIAGNOSIS — R0602 Shortness of breath: Secondary | ICD-10-CM | POA: Diagnosis not present

## 2014-12-04 DIAGNOSIS — Z87891 Personal history of nicotine dependence: Secondary | ICD-10-CM

## 2014-12-04 DIAGNOSIS — Z9181 History of falling: Secondary | ICD-10-CM

## 2014-12-04 DIAGNOSIS — J9691 Respiratory failure, unspecified with hypoxia: Secondary | ICD-10-CM | POA: Diagnosis present

## 2014-12-04 DIAGNOSIS — I5033 Acute on chronic diastolic (congestive) heart failure: Secondary | ICD-10-CM | POA: Diagnosis not present

## 2014-12-04 DIAGNOSIS — Z9119 Patient's noncompliance with other medical treatment and regimen: Secondary | ICD-10-CM | POA: Diagnosis present

## 2014-12-04 DIAGNOSIS — M1389 Other specified arthritis, multiple sites: Secondary | ICD-10-CM | POA: Diagnosis present

## 2014-12-04 DIAGNOSIS — Z794 Long term (current) use of insulin: Secondary | ICD-10-CM

## 2014-12-04 DIAGNOSIS — E118 Type 2 diabetes mellitus with unspecified complications: Secondary | ICD-10-CM | POA: Diagnosis not present

## 2014-12-04 DIAGNOSIS — G8929 Other chronic pain: Secondary | ICD-10-CM | POA: Diagnosis present

## 2014-12-04 DIAGNOSIS — Z6841 Body Mass Index (BMI) 40.0 and over, adult: Secondary | ICD-10-CM

## 2014-12-04 DIAGNOSIS — N179 Acute kidney failure, unspecified: Secondary | ICD-10-CM | POA: Diagnosis not present

## 2014-12-04 LAB — CBG MONITORING, ED: Glucose-Capillary: 211 mg/dL — ABNORMAL HIGH (ref 65–99)

## 2014-12-04 LAB — BASIC METABOLIC PANEL
Anion gap: 12 (ref 5–15)
BUN: 14 mg/dL (ref 6–20)
CALCIUM: 8.9 mg/dL (ref 8.9–10.3)
CO2: 26 mmol/L (ref 22–32)
Chloride: 101 mmol/L (ref 101–111)
Creatinine, Ser: 1.33 mg/dL — ABNORMAL HIGH (ref 0.61–1.24)
GFR calc Af Amer: >60 mL/min (ref >60)
GFR, EST NON AFRICAN AMERICAN: 55 mL/min — AB (ref >60)
GLUCOSE: 220 mg/dL — AB (ref 65–99)
Potassium: 4 mmol/L (ref 3.5–5.1)
Sodium: 139 mmol/L (ref 135–145)

## 2014-12-04 LAB — POCT INR: INR: 4.2

## 2014-12-04 LAB — CBC WITH DIFFERENTIAL/PLATELET
Basophils Absolute: 0 10*3/uL (ref 0.0–0.1)
Basophils Relative: 0 % (ref 0–1)
Eosinophils Absolute: 0.3 10*3/uL (ref 0.0–0.7)
Eosinophils Relative: 2 % (ref 0–5)
HCT: 36.9 % — ABNORMAL LOW (ref 39.0–52.0)
HEMOGLOBIN: 11.6 g/dL — AB (ref 13.0–17.0)
LYMPHS PCT: 18 % (ref 12–46)
Lymphs Abs: 2.5 10*3/uL (ref 0.7–4.0)
MCH: 23.3 pg — ABNORMAL LOW (ref 26.0–34.0)
MCHC: 31.4 g/dL (ref 30.0–36.0)
MCV: 74.2 fL — AB (ref 78.0–100.0)
Monocytes Absolute: 1.1 10*3/uL — ABNORMAL HIGH (ref 0.1–1.0)
Monocytes Relative: 8 % (ref 3–12)
NEUTROS ABS: 10.2 10*3/uL — AB (ref 1.7–7.7)
Neutrophils Relative %: 72 % (ref 43–77)
Platelets: 341 10*3/uL (ref 150–400)
RBC: 4.97 MIL/uL (ref 4.22–5.81)
RDW: 18 % — AB (ref 11.5–15.5)
WBC: 14.1 10*3/uL — ABNORMAL HIGH (ref 4.0–10.5)

## 2014-12-04 LAB — BRAIN NATRIURETIC PEPTIDE: B NATRIURETIC PEPTIDE 5: 163.7 pg/mL — AB (ref 0.0–100.0)

## 2014-12-04 LAB — I-STAT TROPONIN, ED: TROPONIN I, POC: 0.03 ng/mL (ref 0.00–0.08)

## 2014-12-04 LAB — GLUCOSE, CAPILLARY: Glucose-Capillary: 295 mg/dL — ABNORMAL HIGH (ref 65–99)

## 2014-12-04 MED ORDER — WARFARIN - PHARMACIST DOSING INPATIENT
Freq: Every day | Status: DC
  Administered 2014-12-11: 18:00:00

## 2014-12-04 MED ORDER — SPIRONOLACTONE 25 MG PO TABS
25.0000 mg | ORAL_TABLET | Freq: Every day | ORAL | Status: DC
  Administered 2014-12-05 – 2014-12-11 (×7): 25 mg via ORAL
  Filled 2014-12-04 (×7): qty 1

## 2014-12-04 MED ORDER — ENOXAPARIN SODIUM 30 MG/0.3ML ~~LOC~~ SOLN: 30.0000 mg | SUBCUTANEOUS | Status: DC

## 2014-12-04 MED ORDER — INSULIN REGULAR HUMAN (CONC) 500 UNIT/ML ~~LOC~~ SOLN: 90.0000 [IU] | Freq: Three times a day (TID) | SUBCUTANEOUS | Status: DC

## 2014-12-04 MED ORDER — INSULIN ASPART 100 UNIT/ML ~~LOC~~ SOLN
0.0000 [IU] | Freq: Three times a day (TID) | SUBCUTANEOUS | Status: DC
  Administered 2014-12-05 (×2): 15 [IU] via SUBCUTANEOUS
  Administered 2014-12-06: 3 [IU] via SUBCUTANEOUS

## 2014-12-04 MED ORDER — SODIUM CHLORIDE 0.9 % IJ SOLN
3.0000 mL | INTRAMUSCULAR | Status: DC | PRN
Start: 2014-12-04 — End: 2014-12-11

## 2014-12-04 MED ORDER — FENTANYL CITRATE (PF) 100 MCG/2ML IJ SOLN
50.0000 ug | Freq: Once | INTRAMUSCULAR | Status: AC
  Administered 2014-12-04: 50 ug via INTRAVENOUS
  Filled 2014-12-04: qty 2

## 2014-12-04 MED ORDER — INSULIN ASPART 100 UNIT/ML ~~LOC~~ SOLN
0.0000 [IU] | Freq: Every day | SUBCUTANEOUS | Status: DC
  Administered 2014-12-04: 3 [IU] via SUBCUTANEOUS

## 2014-12-04 MED ORDER — OXYCODONE HCL 5 MG PO TABS
30.0000 mg | ORAL_TABLET | Freq: Once | ORAL | Status: AC
  Administered 2014-12-04: 30 mg via ORAL
  Filled 2014-12-04: qty 6

## 2014-12-04 MED ORDER — POTASSIUM CHLORIDE CRYS ER 20 MEQ PO TBCR
20.0000 meq | EXTENDED_RELEASE_TABLET | Freq: Two times a day (BID) | ORAL | Status: DC
  Administered 2014-12-04 – 2014-12-05 (×3): 20 meq via ORAL
  Filled 2014-12-04 (×5): qty 1

## 2014-12-04 MED ORDER — FERROUS SULFATE 325 (65 FE) MG PO TABS
325.0000 mg | ORAL_TABLET | Freq: Every day | ORAL | Status: DC
  Administered 2014-12-05 – 2014-12-11 (×7): 325 mg via ORAL
  Filled 2014-12-04 (×7): qty 1

## 2014-12-04 MED ORDER — METOPROLOL TARTRATE 1 MG/ML IV SOLN
2.5000 mg | Freq: Once | INTRAVENOUS | Status: AC
  Administered 2014-12-04: 2.5 mg via INTRAVENOUS
  Filled 2014-12-04: qty 5

## 2014-12-04 MED ORDER — SODIUM CHLORIDE 0.9 % IJ SOLN
3.0000 mL | Freq: Two times a day (BID) | INTRAMUSCULAR | Status: DC
  Administered 2014-12-04 – 2014-12-11 (×12): 3 mL via INTRAVENOUS

## 2014-12-04 MED ORDER — FUROSEMIDE 10 MG/ML IJ SOLN
80.0000 mg | Freq: Once | INTRAMUSCULAR | Status: AC
  Administered 2014-12-04: 80 mg via INTRAVENOUS
  Filled 2014-12-04: qty 8

## 2014-12-04 MED ORDER — OXYCODONE HCL 5 MG PO TABS
30.0000 mg | ORAL_TABLET | Freq: Four times a day (QID) | ORAL | Status: DC
  Administered 2014-12-04: 30 mg via ORAL
  Filled 2014-12-04: qty 6

## 2014-12-04 MED ORDER — SODIUM CHLORIDE 0.9 % IJ SOLN
3.0000 mL | Freq: Two times a day (BID) | INTRAMUSCULAR | Status: DC
  Administered 2014-12-05 – 2014-12-10 (×7): 3 mL via INTRAVENOUS

## 2014-12-04 MED ORDER — FUROSEMIDE 10 MG/ML IJ SOLN
80.0000 mg | Freq: Two times a day (BID) | INTRAMUSCULAR | Status: DC
  Administered 2014-12-05 – 2014-12-06 (×3): 80 mg via INTRAVENOUS
  Filled 2014-12-04 (×6): qty 8

## 2014-12-04 MED ORDER — GABAPENTIN 400 MG PO CAPS
1200.0000 mg | ORAL_CAPSULE | Freq: Two times a day (BID) | ORAL | Status: DC
  Administered 2014-12-04 – 2014-12-11 (×14): 1200 mg via ORAL
  Filled 2014-12-04 (×15): qty 3

## 2014-12-04 MED ORDER — ROSUVASTATIN CALCIUM 40 MG PO TABS
40.0000 mg | ORAL_TABLET | Freq: Every day | ORAL | Status: DC
  Administered 2014-12-05 – 2014-12-11 (×7): 40 mg via ORAL
  Filled 2014-12-04 (×7): qty 1

## 2014-12-04 MED ORDER — LISINOPRIL 10 MG PO TABS
10.0000 mg | ORAL_TABLET | Freq: Every day | ORAL | Status: DC
  Administered 2014-12-05: 10 mg via ORAL
  Filled 2014-12-04 (×2): qty 1

## 2014-12-04 MED ORDER — SODIUM CHLORIDE 0.9 % IV SOLN: 250.0000 mL | INTRAVENOUS | Status: DC | PRN

## 2014-12-04 MED ORDER — METOPROLOL TARTRATE 25 MG PO TABS
25.0000 mg | ORAL_TABLET | Freq: Two times a day (BID) | ORAL | Status: DC
  Administered 2014-12-04 – 2014-12-11 (×14): 25 mg via ORAL
  Filled 2014-12-04 (×15): qty 1

## 2014-12-04 NOTE — ED Provider Notes (Signed)
CSN: [id number]     Arrival date & time 12/04/14  1729 History   First MD Initiated Contact with Patient 12/04/14 1731     Chief Complaint  Patient presents with  . Shortness of Breath     (Consider location/radiation/quality/duration/timing/severity/associated sxs/prior Treatment) HPI Comments: Patient sent from PCPs office after he presented for an INR check and was found to be very dyspneic on exertion dropping down to 79% on room air with heart rate going up to the 120s and A. Fib.  He states that when he is at rest he is not symptomatic but with minimal movement he gets very short of breath.   Patient is a 64 y.o. male presenting with shortness of breath.  Shortness of Breath Associated symptoms: no abdominal pain, no chest pain, no cough, no fever, no headaches, no rash and no vomiting     Past Medical History  Diagnosis Date  . Hypertension   . Chronic diastolic CHF (congestive heart failure)   . CAD (coronary artery disease)     a. s/p PCI to RCA in 2012. b. prior cath in 01/2014 with elevated L/RH pressures, mild-mod CAD of LAD/LCx with patent RCA, normal EF, c/b CIN/CHF.  [name] PAF (paroxysmal atrial fibrillation)     TEE DCCV 09/23/2014  . Anemia   . Pilonidal cyst 1980's; 01/25/2013  . Shortness of breath     with ambulation, with exertion  . Neuropathy   . History of blood transfusion ~ 1954    "related to OR"  . Carpal tunnel syndrome, bilateral   . Morbid obesity   . Myocardial infarction     pt stated he had a slient heart attack that showed up on nuclear stress.  . Hyperlipidemia   . OSA (obstructive sleep apnea)     "I wear nasal prongs; haven't been using prongs recently" (09/19/2014)  . Type II diabetes mellitus   . Arthritis     "hands and lower back" (09/19/2014)  . Chronic lower back pain   . Chronic kidney disease (CKD), stage III (moderate)   . Cellulitis   . Hypoxia     a. Qualified for home O2 at [place] in 09/2014.  [name] Physical deconditioning    Past  Surgical History  Procedure Laterality Date  . Abdominal surgery  ~ 1954    BENIGN TUMOR REMOVED  . Debridement  foot Left     debriding diabetic foot ulcers  . Cataract extraction w/phaco Right 11/15/2012    Procedure: CATARACT EXTRACTION PHACO AND INTRAOCULAR LENS PLACEMENT (IOC);  Surgeon: Shade Flood, MD;  Location: [facility] OR;  Service: Ophthalmology;  Laterality: Right;  . Cataract extraction w/phaco Left 11/29/2012    Procedure: CATARACT EXTRACTION PHACO AND INTRAOCULAR LENS PLACEMENT (IOC);  Surgeon: Shade Flood, MD;  Location: [facility] OR;  Service: Ophthalmology;  Laterality: Left;  . Pilonidal cyst excision N/A 01/08/2013    Procedure: CYST EXCISION PILONIDAL EXTENSIVE;  Surgeon: Axel Filler, MD;  Location: [facility] OR;  Service: General;  Laterality: N/A;  . Foreign body removal Right 2014    heel,  splinter removal   . Pars plana vitrectomy Left 06/05/2013    Procedure: PARS PLANA VITRECTOMY WITH 23 GAUGE with Endolaser(constellation);  Surgeon: Shade Flood, MD;  Location: [facility] OR;  Service: Ophthalmology;  Laterality: Left;  . Left and right heart catheterization with coronary angiogram N/A 01/31/2014    Procedure: LEFT AND RIGHT HEART CATHETERIZATION WITH CORONARY ANGIOGRAM;  Surgeon: Micheline Chapman, MD;  Location: [facility] CATH LAB;  Service: Cardiovascular;  Laterality: N/A;  . Tonsillectomy    . Appendectomy    . Eye surgery    . Coronary angioplasty with stent placement  August 2012    RCA DES - [facility]  . Cardiac catheterization  01/2014  . Cardioversion  2010    [name] 09/19/2014  . Pilonidal cyst excision  1980's    "in [place]"  . Tee without cardioversion N/A 09/23/2014    Procedure: TRANSESOPHAGEAL ECHOCARDIOGRAM (TEE);  Surgeon: Lewayne Bunting, MD;  Location: [facility] ENDOSCOPY;  Service: Cardiovascular;  Laterality: N/A;  . Cardioversion N/A 09/23/2014    Procedure: CARDIOVERSION;  Surgeon: Lewayne Bunting, MD;  Location: [facility] ENDOSCOPY;  Service:  Cardiovascular;  Laterality: N/A;   Family History  Problem Relation Age of Onset  . Adopted: Yes  . Other Other     PT ADOPTED   History  Substance Use Topics  . Smoking status: Former Smoker -- 1.00 packs/day for 20 years    Types: Cigarettes    Quit date: 07/13/1983  . Smokeless tobacco: Never Used  . Alcohol Use: No    Review of Systems  Constitutional: Negative for fever, activity change, appetite change and fatigue.  HENT: Negative for congestion, facial swelling, rhinorrhea and trouble swallowing.   Eyes: Negative for photophobia and pain.  Respiratory: Positive for shortness of breath. Negative for cough and chest tightness.   Cardiovascular: Negative for chest pain and leg swelling.  Gastrointestinal: Negative for nausea, vomiting, abdominal pain, diarrhea and constipation.  Endocrine: Negative for polydipsia and polyuria.  Genitourinary: Negative for dysuria, urgency, decreased urine volume and difficulty urinating.  Musculoskeletal: Negative for back pain and gait problem.  Skin: Negative for color change, rash and wound.  Allergic/Immunologic: Negative for immunocompromised state.  Neurological: Negative for dizziness, facial asymmetry, speech difficulty, weakness, numbness and headaches.  Psychiatric/Behavioral: Negative for confusion, decreased concentration and agitation.      Allergies  Review of patient's allergies indicates no known allergies.  Home Medications   Prior to Admission medications   Medication Sig Start Date End Date Taking? Authorizing Provider  ACCU-CHEK AVIVA PLUS test strip  08/30/14   Historical Provider, MD  ACCU-CHEK FASTCLIX LANCETS MISC  08/17/13   Historical Provider, MD  BD INSULIN SYRINGE ULTRAFINE 31G X 15/64" 0.3 ML MISC  08/19/14   Historical Provider, MD  Ferrous Sulfate (IRON) 325 (65 FE) MG TABS Take 1 tablet by mouth daily.    Historical Provider, MD  furosemide (LASIX) 40 MG tablet Take 1 tablet (40 mg total) by mouth 2 (two)  times daily. 11/27/14   Dione Booze, MD  furosemide (LASIX) 80 MG tablet Take 1 tablet (80 mg total) by mouth 2 (two) times daily. 09/24/14   Azalee Course, PA  gabapentin (NEURONTIN) 300 MG capsule Take 1,200 mg by mouth 2 (two) times daily.     Historical Provider, MD  Hypromellose (NATURAL BALANCE TEARS OP) Place 1 drop into both eyes daily as needed.    Historical Provider, MD  insulin regular human CONCENTRATED (HUMULIN R) 500 UNIT/ML SOLN injection Inject 90-120 Units into the skin 3 (three) times daily with meals.     Historical Provider, MD  lisinopril (PRINIVIL,ZESTRIL) 10 MG tablet Take 1 tablet by mouth daily. 11/15/14   Historical Provider, MD  metoprolol tartrate (LOPRESSOR) 25 MG tablet Take 1 tablet (25 mg total) by mouth 2 (two) times daily. 09/24/14   Azalee Course, PA  Multiple Vitamin (MULTIVITAMIN WITH MINERALS) TABS Take 1  tablet by mouth daily.    Historical Provider, MD  oxycodone (ROXICODONE) 30 MG immediate release tablet Take 30 mg by mouth 4 (four) times daily.     Historical Provider, MD  Podiatric Products (DIADERM FOOT REJUVENATING) CREA Apply 1 application topically daily as needed (dry skin).    Historical Provider, MD  potassium chloride SA (KLOR-CON M20) 20 MEQ tablet Take 1 tablet (20 mEq total) by mouth 2 (two) times daily. 11/27/14   Dione Booze, MD  rosuvastatin (CRESTOR) 40 MG tablet Take 40 mg by mouth daily.    Historical Provider, MD  spironolactone (ALDACTONE) 25 MG tablet Take 1 tablet (25 mg total) by mouth daily. 09/18/14   Lewayne Bunting, MD  TRULICITY 0.75 MG/0.5ML SOPN Take 1 tablet by mouth once a week. 03/15/14   Historical Provider, MD  warfarin (COUMADIN) 5 MG tablet TAKE AS DIRECTED BY COUMADIN CLINIC Patient taking differently: Take 5mg  everyday except Tuesday and Thursday take 7.5 mg 10/07/14   Lewayne Bunting, MD   BP 135/75 mmHg  Pulse 101  Temp(Src) 97.8 F (36.6 C) (Oral)  Resp 16  Ht 6' (1.829 m)  Wt 377 lb 12.8 oz (171.369 kg)  BMI 51.23 kg/m2   SpO2 94% Physical Exam  Constitutional: He is oriented to person, place, and time. He appears well-developed and well-nourished. No distress.  HENT:  Head: Normocephalic and atraumatic.  Mouth/Throat: No oropharyngeal exudate.  Eyes: Pupils are equal, round, and reactive to light.  Neck: Normal range of motion. Neck supple.  Cardiovascular: Normal rate, regular rhythm and normal heart sounds.  Exam reveals no gallop and no friction rub.   No murmur heard. Pulmonary/Chest: Effort normal and breath sounds normal. No respiratory distress. He has no wheezes. He has no rales.  Abdominal: Soft. Bowel sounds are normal. He exhibits no distension and no mass. There is no tenderness. There is no rebound and no guarding.  Genitourinary:  Scrotal edema  Musculoskeletal: Normal range of motion. He exhibits edema (2+). He exhibits no tenderness.  Neurological: He is alert and oriented to person, place, and time.  Skin: Skin is warm and dry.  Psychiatric: He has a normal mood and affect.    ED Course  Procedures (including critical care time) Labs Review Labs Reviewed  CBC WITH DIFFERENTIAL/PLATELET - Abnormal; Notable for the following:    WBC 14.1 (*)    Hemoglobin 11.6 (*)    HCT 36.9 (*)    MCV 74.2 (*)    MCH 23.3 (*)    RDW 18.0 (*)    Neutro Abs 10.2 (*)    Monocytes Absolute 1.1 (*)    All other components within normal limits  BASIC METABOLIC PANEL - Abnormal; Notable for the following:    Glucose, Bld 220 (*)    Creatinine, Ser 1.33 (*)    GFR calc non Af Amer 55 (*)    All other components within normal limits  BRAIN NATRIURETIC PEPTIDE - Abnormal; Notable for the following:    B Natriuretic Peptide 163.7 (*)    All other components within normal limits  GLUCOSE, CAPILLARY - Abnormal; Notable for the following:    Glucose-Capillary 295 (*)    All other components within normal limits  CBG MONITORING, ED - Abnormal; Notable for the following:    Glucose-Capillary 211 (*)     All other components within normal limits  PROTIME-INR  TROPONIN I  TROPONIN I  TROPONIN I  TSH  CBC  COMPREHENSIVE METABOLIC PANEL  Rosezena Sensor, ED    Imaging Review Dg Chest 2 View  12/04/2014   CLINICAL DATA:  Chronic shortness of breath with exertion, acute onset of cough approximately 2 days ago. Prior coronary artery stenting in 2010. Current history of hypertension, diabetes and atrial fibrillation.  EXAM: CHEST  2 VIEW  COMPARISON:  09/20/2014 and earlier.  FINDINGS: AP erect and lateral imaging was performed. Cardiac silhouette markedly enlarged but stable. Thoracic aorta atherosclerotic, unchanged. Hilar and mediastinal contours otherwise unremarkable. Mild pulmonary venous hypertension, unchanged, without evidence of pulmonary edema. Lungs clear. No pleural effusions. Degenerative changes involving the thoracic spine. No significant interval change dating back to July, 2013.  IMPRESSION: Stable cardiomegaly.  No acute cardiopulmonary disease.   Electronically Signed   By: Hulan Saas M.D.   On: 12/04/2014 19:24     EKG Interpretation   Date/Time:  Wednesday Dec 04 2014 17:45:37 EDT Ventricular Rate:  100 PR Interval:    QRS Duration: 98 QT Interval:  369 QTC Calculation: 476 R Axis:   46 Text Interpretation:  Atrial fibrillation Low voltage, extremity and  precordial leads Borderline prolonged QT interval Confirmed by Lesta Limbert   MD, Lakya Schrupp ([id number]) on 12/04/2014 9:11:04 PM      MDM   Final diagnoses:  Acute on chronic respiratory failure with hypoxia  Peripheral edema  Scrotal edema    Pt is a 64 y.o. male with Pmhx as above who presents with DOE, from PCPs office where he was found to drop down to 79% on room air with a heart rate up to the 120s with minimal movement.  Patient states that he has had increased shortness of breath for the past several days he has had minimal cough.  He denies chest pain.  He has had some increased lower extremity and scrotal  swelling.  He denies fevers or chills.  He wears 2 L by nasal cannula as needed.  He has a history of A. fib but was cardioverted earlier this year.  On physical exam, patient is in A. fib and one still heart rate is in 80s to 100 blood pressure stable when still, O2 sats are stable but with minimal movement.  Heart rate increases to greater than 110.  He has 2+ bilateral lower extremity pitting edema and scrotal edema, lungs are clear.  BNP is mildly elevated.  Chest x-ray with stable cardiomegaly clinically, I am concerned for CHF.  [facility] will admit.          Toy Cookey, MD 12/05/14 [id number]

## 2014-12-04 NOTE — Progress Notes (Signed)
ANTICOAGULATION CONSULT NOTE - Initial Consult  Pharmacy Consult for warfarin Indication: atrial fibrillation  No Known Allergies  Patient Measurements:   Heparin Dosing Weight:   Vital Signs: BP: 142/67 mmHg (05/25 2100) Pulse Rate: 109 (05/25 2108)  Labs:  Recent Labs  12/04/14 1457 12/04/14 1756  HGB  --  11.6*  HCT  --  36.9*  PLT  --  341  INR 4.2  --   CREATININE  --  1.33*    Estimated Creatinine Clearance: 92.1 mL/min (by C-G formula based on Cr of 1.33).   Medical History: Past Medical History  Diagnosis Date  . Hypertension   . Chronic diastolic CHF (congestive heart failure)   . CAD (coronary artery disease)     a. s/p PCI to RCA in 2012. b. prior cath in 01/2014 with elevated L/RH pressures, mild-mod CAD of LAD/LCx with patent RCA, normal EF, c/b CIN/CHF.  [name] PAF (paroxysmal atrial fibrillation)     TEE DCCV 09/23/2014  . Anemia   . Pilonidal cyst 1980's; 01/25/2013  . Shortness of breath     with ambulation, with exertion  . Neuropathy   . History of blood transfusion ~ 1954    "related to OR"  . Carpal tunnel syndrome, bilateral   . Morbid obesity   . Myocardial infarction     pt stated he had a slient heart attack that showed up on nuclear stress.  . Hyperlipidemia   . OSA (obstructive sleep apnea)     "I wear nasal prongs; haven't been using prongs recently" (09/19/2014)  . Type II diabetes mellitus   . Arthritis     "hands and lower back" (09/19/2014)  . Chronic lower back pain   . Chronic kidney disease (CKD), stage III (moderate)   . Cellulitis   . Hypoxia     a. Qualified for home O2 at DC in 09/2014.  [name] Physical deconditioning    Assessment: 64 yom presented to the hospital with dyspnea. He is on chronic coumadin for hx of afib. INR today was elevated at coumadin clinic. H/H slightly low but platelets are WNL. No bleeding noted.   Goal of Therapy:  INR 2-3 Monitor platelets by anticoagulation protocol: Yes   Plan:  - No warfarin  today - Daily INR  Trevan Messman, Drake Leach 12/04/2014,10:21 PM

## 2014-12-04 NOTE — Progress Notes (Signed)
Cardiology Office Note Date:  12/04/2014  Patient ID:  [name] 05/24/1951, MRN [id number] PCP:  Paul Bussing, MD  Cardiologist: Dr. Jens [name]    Chief Complaint: SOB  History of Present Illness: [name] is a 64 y.o. male with history of chronic diastolic CHF, CAD (s/p PCI to RCA in 2012, prior cath in 01/2014 with elevated L/RH pressures, mild-mod CAD of LAD/LCx with patent RCA, normal EF, c/b CIN/CHF), morbid obesity, paroxysmal atrial fibrillation, cellulitis, OSA (unable to tolerate CPAP), DM, HLD, anemia, CKD stage III, deconditioning who presented as an add-on visit for profound dyspnea.  Last admission was in 09/2014 when he presented to the office with tachypnea, tachycardia and hypoxia, and 19lb weight gain (408lb). He was admitted for IV diuresis and IV diltiazem for AF-RVR (109bpm). He underwent a TEE cardioversion on 09/23/2014 which showed EF 55-60%, no evidence of vegetation or LV thrombus, moderate TR, mild MR. He subsequent received cardioversion. He qualified for home O2. Discharge weight 396lb.  He was seen in the ER 11/27/14 for fall when his blood sugar dropped, scrotal pain and difficulty with urination. Labs showed Na 140, K 3.2, Cl 100, CO2 26, Glu 243, BUN 9, Cr 1.38, albumin 2.9. WBC was 13.9, microcytic anemia Hgb 11.1, plt 306. He had hypoglycemic episode in the ER. Scrotal sx were felt due to hydrocele. His Lasix was increased to 120mg  BID for the next 5 days and potassium was increased. He says he did not make these medicine changes. He came in today for INR check because he was awaiting dental procedure tomorrow for severe tooth pain. He was notice by [facility] nurse to have labored breathing requiring further evaluation. EKG shows he is back in afib, rate 94bpm at rest. Initial VS with BP 144/78 and pulse ox of 97%. However, with minimal activity he would become severely dyspneic with pulse ox of 79%, increased RR, panting, and HR up to the 120s. It  took about 10 minutes for HR and pulse ox to stabilize. He denies any CP or palpitations. He complains of dyspnea, significant knee pain, and severe tooth pain.  Past Medical History  Diagnosis Date  . Hypertension   . Chronic diastolic CHF (congestive heart failure)   . CAD (coronary artery disease)     a. s/p PCI to RCA in 2012. b. prior cath in 01/2014 with elevated L/RH pressures, mild-mod CAD of LAD/LCx with patent RCA, normal EF, c/b CIN/CHF.  [name] PAF (paroxysmal atrial fibrillation)     TEE DCCV 09/23/2014  . Anemia   . Pilonidal cyst 1980's; 01/25/2013  . Shortness of breath     with ambulation, with exertion  . Neuropathy   . History of blood transfusion ~ 1954    "related to OR"  . Carpal tunnel syndrome, bilateral   . Morbid obesity   . Myocardial infarction     pt stated he had a slient heart attack that showed up on nuclear stress.  . Hyperlipidemia   . OSA (obstructive sleep apnea)     "I wear nasal prongs; haven't been using prongs recently" (09/19/2014)  . Type II diabetes mellitus   . Arthritis     "hands and lower back" (09/19/2014)  . Chronic lower back pain   . Chronic kidney disease (CKD), stage III (moderate)   . Cellulitis   . Hypoxia     a. Qualified for home O2 at DC in 09/2014.  [name] Physical deconditioning     Past Surgical  History  Procedure Laterality Date  . Abdominal surgery  ~ 1954    BENIGN TUMOR REMOVED  . Debridement  foot Left     debriding diabetic foot ulcers  . Cataract extraction w/phaco Right 11/15/2012    Procedure: CATARACT EXTRACTION PHACO AND INTRAOCULAR LENS PLACEMENT (IOC);  Surgeon: Paul Flood, MD;  Location: [facility] OR;  Service: Ophthalmology;  Laterality: Right;  . Cataract extraction w/phaco Left 11/29/2012    Procedure: CATARACT EXTRACTION PHACO AND INTRAOCULAR LENS PLACEMENT (IOC);  Surgeon: Paul Flood, MD;  Location: [facility] OR;  Service: Ophthalmology;  Laterality: Left;  . Pilonidal cyst excision N/A 01/08/2013    Procedure: CYST  EXCISION PILONIDAL EXTENSIVE;  Surgeon: Paul Filler, MD;  Location: [facility] OR;  Service: General;  Laterality: N/A;  . Foreign body removal Right 2014    heel,  splinter removal   . Pars plana vitrectomy Left 06/05/2013    Procedure: PARS PLANA VITRECTOMY WITH 23 GAUGE with Endolaser(constellation);  Surgeon: Paul Flood, MD;  Location: [facility] OR;  Service: Ophthalmology;  Laterality: Left;  . Left and right heart catheterization with coronary angiogram N/A 01/31/2014    Procedure: LEFT AND RIGHT HEART CATHETERIZATION WITH CORONARY ANGIOGRAM;  Surgeon: Paul Chapman, MD;  Location: [facility] CATH LAB;  Service: Cardiovascular;  Laterality: N/A;  . Tonsillectomy    . Appendectomy    . Eye surgery    . Coronary angioplasty with stent placement  August 2012    RCA DES - [facility]  . Cardiac catheterization  01/2014  . Cardioversion  2010    [name] 09/19/2014  . Pilonidal cyst excision  1980's    "in [place]"  . Tee without cardioversion N/A 09/23/2014    Procedure: TRANSESOPHAGEAL ECHOCARDIOGRAM (TEE);  Surgeon: Paul Bunting, MD;  Location: [facility] ENDOSCOPY;  Service: Cardiovascular;  Laterality: N/A;  . Cardioversion N/A 09/23/2014    Procedure: CARDIOVERSION;  Surgeon: Paul Bunting, MD;  Location: [facility] ENDOSCOPY;  Service: Cardiovascular;  Laterality: N/A;    Current Outpatient Prescriptions  Medication Sig Dispense Refill  . ACCU-CHEK AVIVA PLUS test strip   0  . ACCU-CHEK FASTCLIX LANCETS MISC     . BD INSULIN SYRINGE ULTRAFINE 31G X 15/64" 0.3 ML MISC   0  . Ferrous Sulfate (IRON) 325 (65 FE) MG TABS Take 1 tablet by mouth daily.    . furosemide (LASIX) 40 MG tablet Take 1 tablet (40 mg total) by mouth 2 (two) times daily. 10 tablet 0  . furosemide (LASIX) 80 MG tablet Take 1 tablet (80 mg total) by mouth 2 (two) times daily. 60 tablet 5  . gabapentin (NEURONTIN) 300 MG capsule Take 1,200 mg by mouth 2 (two) times daily.     . Hypromellose (NATURAL BALANCE TEARS  OP) Place 1 drop into both eyes daily as needed.    . insulin regular human CONCENTRATED (HUMULIN R) 500 UNIT/ML SOLN injection Inject 90-120 Units into the skin 3 (three) times daily with meals.     [name] lisinopril (PRINIVIL,ZESTRIL) 10 MG tablet Take 1 tablet by mouth daily.    . metoprolol tartrate (LOPRESSOR) 25 MG tablet Take 1 tablet (25 mg total) by mouth 2 (two) times daily. 60 tablet 5  . Multiple Vitamin (MULTIVITAMIN WITH MINERALS) TABS Take 1 tablet by mouth daily.    [name] oxycodone (ROXICODONE) 30 MG immediate release tablet Take 30 mg by mouth 4 (four) times daily.     . Podiatric Products (DIADERM FOOT REJUVENATING) CREA Apply 1 application  topically daily as needed (dry skin).    . potassium chloride SA (KLOR-CON M20) 20 MEQ tablet Take 1 tablet (20 mEq total) by mouth 2 (two) times daily. 60 tablet 6  . rosuvastatin (CRESTOR) 40 MG tablet Take 40 mg by mouth daily.    [name] spironolactone (ALDACTONE) 25 MG tablet Take 1 tablet (25 mg total) by mouth daily. 30 tablet 2  . TRULICITY 0.75 MG/0.5ML SOPN Take 1 tablet by mouth once a week.    . warfarin (COUMADIN) 5 MG tablet TAKE AS DIRECTED BY COUMADIN CLINIC (Patient taking differently: Take  everyday except Tuesday and Thursday take 7.5 mg) 45 tablet 2   No current facility-administered medications for this visit.    Allergies:   Review of patient's allergies indicates no known allergies.   Social History:  The patient  reports that he quit smoking about 31 years ago. His smoking use included Cigarettes. He has a 20 pack-year smoking history. He has never used smokeless tobacco. He reports that he does not drink alcohol or use illicit drugs.   Family History:  The patient's family history includes Other in his other. He was adopted.  ROS:  Please see the history of present illness.    All other systems are reviewed and otherwise negative.   PHYSICAL EXAM: VS:  BP 144/78 mmHg  Pulse 88  Resp 22  Wt 374 lb 6.4 oz (169.827 kg)   SpO2 97% BMI: Body mass index is 50.77 kg/(m^2).  Temp 97.6. Pulse ox 79% with ambulation with significant dyspnea. Well nourished obese WM with grayish complexion, panting at times - improved once place on O2 and resting in chair HEENT: normocephalic, atraumatic Neck: no JVD, carotid bruits or masses Cardiac:  Irregularly irregular, borderline tachycardic, S1, S2; RRR; no murmurs, rubs, or gallops Lungs:  clear to auscultation bilaterally, no wheezing, rhonchi or rales Abd: soft, nontender, no hepatomegaly, + BS MS: no deformity or atrophy Ext: trace edema with chronic venous stasis changes and baseline large leg habitus Skin: warm and dry, no rash Neuro:  moves all extremities spontaneously, no focal abnormalities noted, follows commands Psych: euthymic mood, full affect  EKG:  Done today shows atrial fibrillation 94bpm, low voltage QRS, cannot rule out anterior infarct, age undetermined. He was actually back in AF RVR on 11/27/14.  Recent Labs: 02/11/2014: Magnesium 1.7; TSH 0.408 09/19/2014: B Natriuretic Peptide 153.2* 11/27/2014: ALT 18; BUN 9; Creatinine 1.38*; Hemoglobin 11.1*; Platelets 306; Potassium 3.2*; Sodium 140  No results found for requested labs within last 365 days.   Estimated Creatinine Clearance: 88.7 mL/min (by C-G formula based on Cr of 1.38).   Wt Readings from Last 3 Encounters:  12/04/14 374 lb 6.4 oz (169.827 kg)  11/27/14 372 lb (168.738 kg)  09/24/14 396 lb 6.4 oz (179.806 kg)     Other studies reviewed: Additional studies/records reviewed today include: summarized above  ASSESSMENT AND PLAN:  1. Acute hypoxic respiratory failure with hypoxia and dyspnea 2. Worsening dental pain - INR 4.2 3. Paroxysmal atrial fibrillation, back in AF, with supratherapeutic INR 4. Chronic diastolic CHF 5. Morbid obesity with OSA and probable component of OHS 6. Microcytic anemia 7. CKD stage III with recent significant hypokalemia 8. Recent fall with knee pain 9. DM  with recent hypoglycemia 10. Testicular swelling and discomfort 11. Recent leukocytosis.  Complex clinical picture. He presented as an add-on to clinic today with dyspnea, hypoxia, and grayish complexion. He is noted to be back in AF. It appears he was  back in AF as of ER visit 11/27/14. Today he was initially rate controlled but was still SOB during this time. Rate did increase with exertion. Weight is stable compared to prior (prior DC weight was 396 -> he is 374 today.) Lung exam seems out of proportion for hypoxia. He complains of significant knee/tooth pain. Recommend he proceed to ER for further evaluation. Since HR came down with rest, it is not clear that his home AVN blocking agents will need to be adjusted. Recent leukocytosis is concerning for an underlying process which has driven the above processes. Would recommend further evaluation by ER/internal medicine.   Our service will follow-up in AM.  Disposition: F/u to be determined depending on ER course.  Current medicines are reviewed at length with the patient today.  The patient did not have any concerns regarding medicines.  Thomasene Mohair PA-C 12/04/2014 4:36 PM     [facility] [address] [place] [address] [phone] (office)  [phone] (fax)

## 2014-12-04 NOTE — ED Notes (Signed)
Pt brought in via [facility] with complaints of difficulty breathing. Pt was at [facility] having lab work checked for procedure in the AM, when medical staff noticed pt to be SOB. Pt SP02 was 79% on RA, when placed on 2 L oxygen saturation level increased to 98%. Pt is A/O. Pt denies any CP. Pt has a history of CHF, CKD, CAD, and a-fib.

## 2014-12-04 NOTE — H&P (Signed)
[name] is an 64 y.o. male.    Dr Marikay Alar (pcp) Kirk Ruths (cardiologist)  Chief Complaint:   dyspnea HPI: 64 yo male with Pafib (WHQPR9=1), CHF (diastolic), CAD, was having his INR checked and he was noted to be hypoxic. Pt was sent to ED for evaluation.  Pt denies fever, chills, cough, cp, palp, n/v, diarrhea, brbpr.  CXR negative , BNP 163.  Pt was noted to be in Afib with RVR.  Pt will be admitted for Afib with rvr (with movement), and also dyspnea.    Past Medical History  Diagnosis Date  . Hypertension   . Chronic diastolic CHF (congestive heart failure)   . CAD (coronary artery disease)     a. s/p PCI to RCA in 2012. b. prior cath in 01/2014 with elevated L/RH pressures, mild-mod CAD of LAD/LCx with patent RCA, normal EF, c/b CIN/CHF.  [name] PAF (paroxysmal atrial fibrillation)     TEE DCCV 09/23/2014  . Anemia   . Pilonidal cyst 1980's; 01/25/2013  . Shortness of breath     with ambulation, with exertion  . Neuropathy   . History of blood transfusion ~ 1954    "related to OR"  . Carpal tunnel syndrome, bilateral   . Morbid obesity   . Myocardial infarction     pt stated he had a slient heart attack that showed up on nuclear stress.  . Hyperlipidemia   . OSA (obstructive sleep apnea)     "I wear nasal prongs; haven't been using prongs recently" (09/19/2014)  . Type II diabetes mellitus   . Arthritis     "hands and lower back" (09/19/2014)  . Chronic lower back pain   . Chronic kidney disease (CKD), stage III (moderate)   . Cellulitis   . Hypoxia     a. Qualified for home O2 at DC in 09/2014.  [name] Physical deconditioning     Past Surgical History  Procedure Laterality Date  . Abdominal surgery  ~ [place]  . Debridement  foot Left     debriding diabetic foot ulcers  . Cataract extraction w/phaco Right 11/15/2012    Procedure: CATARACT EXTRACTION PHACO AND INTRAOCULAR LENS PLACEMENT (IOC);  Surgeon: Adonis Brook, MD;  Location: [place];  Service:  Ophthalmology;  Laterality: Right;  . Cataract extraction w/phaco Left 11/29/2012    Procedure: CATARACT EXTRACTION PHACO AND INTRAOCULAR LENS PLACEMENT (IOC);  Surgeon: Adonis Brook, MD;  Location: [place];  Service: Ophthalmology;  Laterality: Left;  . Pilonidal cyst excision N/A 01/08/2013    Procedure: CYST EXCISION PILONIDAL EXTENSIVE;  Surgeon: Ralene Ok, MD;  Location: [employer];  Service: General;  Laterality: N/A;  . Foreign body removal Right 2014    heel,  splinter removal   . Pars plana vitrectomy Left 06/05/2013    Procedure: PARS PLANA VITRECTOMY WITH 23 GAUGE with Endolaser(constellation);  Surgeon: Adonis Brook, MD;  Location: [place];  Service: Ophthalmology;  Laterality: Left;  . Left and right heart catheterization with coronary angiogram N/A 01/31/2014    Procedure: LEFT AND RIGHT HEART CATHETERIZATION WITH CORONARY ANGIOGRAM;  Surgeon: Blane Ohara, MD;  Location: [facility] CATH LAB;  Service: Cardiovascular;  Laterality: N/A;  . Tonsillectomy    . Appendectomy    . Eye surgery    . Coronary angioplasty with stent placement  August 2012    RCA DES - [facility]  . Cardiac catheterization  01/2014  . Cardioversion  2010    /  notes 09/19/2014  . Pilonidal cyst excision  1980's    "in [place]"  . Tee without cardioversion N/A 09/23/2014    Procedure: TRANSESOPHAGEAL ECHOCARDIOGRAM (TEE);  Surgeon: Lelon Perla, MD;  Location: [facility] ENDOSCOPY;  Service: Cardiovascular;  Laterality: N/A;  . Cardioversion N/A 09/23/2014    Procedure: CARDIOVERSION;  Surgeon: Lelon Perla, MD;  Location: [facility] ENDOSCOPY;  Service: Cardiovascular;  Laterality: N/A;    Family History  Problem Relation Age of Onset  . Adopted: Yes  . Other Other     PT ADOPTED   Social History:  reports that he quit smoking about 31 years ago. His smoking use included Cigarettes. He has a 20 pack-year smoking history. He has never used smokeless tobacco. He reports that he does not drink  alcohol or use illicit drugs.  Allergies: No Known Allergies Medications reviewed  Results for orders placed or performed during the hospital encounter of 12/04/14 (from the past 48 hour(s))  CBG monitoring, ED     Status: Abnormal   Collection Time: 12/04/14  5:41 PM  Result Value Ref Range   Glucose-Capillary 211 (H) 65 - 99 mg/dL  CBC with Differential/Platelet     Status: Abnormal   Collection Time: 12/04/14  5:56 PM  Result Value Ref Range   WBC 14.1 (H) 4.0 - 10.5 K/uL   RBC 4.97 4.22 - 5.81 MIL/uL   Hemoglobin 11.6 (L) 13.0 - 17.0 g/dL   HCT 36.9 (L) 39.0 - 52.0 %   MCV 74.2 (L) 78.0 - 100.0 fL   MCH 23.3 (L) 26.0 - 34.0 pg   MCHC 31.4 30.0 - 36.0 g/dL   RDW 18.0 (H) 11.5 - 15.5 %   Platelets 341 150 - 400 K/uL   Neutrophils Relative % 72 43 - 77 %   Neutro Abs 10.2 (H) 1.7 - 7.7 K/uL   Lymphocytes Relative 18 12 - 46 %   Lymphs Abs 2.5 0.7 - 4.0 K/uL   Monocytes Relative 8 3 - 12 %   Monocytes Absolute 1.1 (H) 0.1 - 1.0 K/uL   Eosinophils Relative 2 0 - 5 %   Eosinophils Absolute 0.3 0.0 - 0.7 K/uL   Basophils Relative 0 0 - 1 %   Basophils Absolute 0.0 0.0 - 0.1 K/uL  Basic metabolic panel     Status: Abnormal   Collection Time: 12/04/14  5:56 PM  Result Value Ref Range   Sodium 139 135 - 145 mmol/L   Potassium 4.0 3.5 - 5.1 mmol/L   Chloride 101 101 - 111 mmol/L   CO2 26 22 - 32 mmol/L   Glucose, Bld 220 (H) 65 - 99 mg/dL   BUN 14 6 - 20 mg/dL   Creatinine, Ser 1.33 (H) 0.61 - 1.24 mg/dL   Calcium 8.9 8.9 - 10.3 mg/dL   GFR calc non Af Amer 55 (L) >60 mL/min   GFR calc Af Amer >60 >60 mL/min    Comment: (NOTE) The eGFR has been calculated using the CKD EPI equation. This calculation has not been validated in all clinical situations. eGFR's persistently <60 mL/min signify possible Chronic Kidney Disease.    Anion gap 12 5 - 15  Brain natriuretic peptide     Status: Abnormal   Collection Time: 12/04/14  5:56 PM  Result Value Ref Range   B Natriuretic  Peptide 163.7 (H) 0.0 - 100.0 pg/mL  I-stat troponin, ED     Status: None   Collection Time: 12/04/14  6:07 PM  Result  Value Ref Range   Troponin i, poc 0.03 0.00 - 0.08 ng/mL   Comment 3            Comment: Due to the release kinetics of cTnI, a negative result within the first hours of the onset of symptoms does not rule out myocardial infarction with certainty. If myocardial infarction is still suspected, repeat the test at appropriate intervals.    Dg Chest 2 View  12/04/2014   CLINICAL DATA:  Chronic shortness of breath with exertion, acute onset of cough approximately 2 days ago. Prior coronary artery stenting in 2010. Current history of hypertension, diabetes and atrial fibrillation.  EXAM: CHEST  2 VIEW  COMPARISON:  09/20/2014 and earlier.  FINDINGS: AP erect and lateral imaging was performed. Cardiac silhouette markedly enlarged but stable. Thoracic aorta atherosclerotic, unchanged. Hilar and mediastinal contours otherwise unremarkable. Mild pulmonary venous hypertension, unchanged, without evidence of pulmonary edema. Lungs clear. No pleural effusions. Degenerative changes involving the thoracic spine. No significant interval change dating back to July, 2013.  IMPRESSION: Stable cardiomegaly.  No acute cardiopulmonary disease.   Electronically Signed   By: Evangeline Dakin M.D.   On: 12/04/2014 19:24    Review of Systems  Constitutional: Negative for fever, chills, weight loss, malaise/fatigue and diaphoresis.  HENT: Negative for congestion, ear discharge, ear pain, hearing loss, nosebleeds, sore throat and tinnitus.   Eyes: Negative for blurred vision, double vision, photophobia, pain, discharge and redness.  Respiratory: Positive for shortness of breath. Negative for cough, hemoptysis, sputum production, wheezing and stridor.   Cardiovascular: Positive for orthopnea and leg swelling. Negative for chest pain, palpitations, claudication and PND.       Chronic leg swelling   Gastrointestinal: Negative for heartburn, nausea, vomiting, abdominal pain, diarrhea, constipation, blood in stool and melena.  Genitourinary: Negative for dysuria, urgency, frequency, hematuria and flank pain.  Musculoskeletal: Negative for myalgias, back pain, joint pain, falls and neck pain.  Skin: Negative for itching and rash.  Neurological: Negative for dizziness, tingling, tremors, sensory change, speech change, focal weakness, seizures, loss of consciousness, weakness and headaches.  Endo/Heme/Allergies: Negative for environmental allergies and polydipsia. Does not bruise/bleed easily.  Psychiatric/Behavioral: Negative for depression, suicidal ideas, hallucinations, memory loss and substance abuse. The patient is not nervous/anxious and does not have insomnia.     Blood pressure 142/67, pulse 109, resp. rate 15, SpO2 90 %. Physical Exam  Constitutional: He is oriented to person, place, and time. He appears well-developed and well-nourished.  HENT:  Head: Normocephalic and atraumatic.  Mouth/Throat: No oropharyngeal exudate.  Eyes: Conjunctivae and EOM are normal. Pupils are equal, round, and reactive to light. No scleral icterus.  Neck: Normal range of motion. Neck supple. JVD present. No tracheal deviation present. No thyromegaly present.  Cardiovascular: Exam reveals no gallop and no friction rub.   No murmur heard. Irr, irr, s1, s2  Respiratory: No respiratory distress. He has no wheezes. He has rales. He exhibits no tenderness.  GI: Soft. Bowel sounds are normal. He exhibits no distension and no mass. There is no tenderness. There is no rebound and no guarding.  Musculoskeletal: Normal range of motion. He exhibits no edema or tenderness.  Lymphadenopathy:    He has no cervical adenopathy.  Neurological: He is alert and oriented to person, place, and time. He has normal reflexes. He displays normal reflexes. No cranial nerve deficit. He exhibits normal muscle tone. Coordination  normal.  Skin: Skin is warm and dry. No rash noted. No erythema. No  pallor.  Psychiatric: He has a normal mood and affect. His behavior is normal. Judgment and thought content normal.     Assessment/Plan Dyspnea ? secondary to mild chf vs pafib with rvr Check trop i q6hx3 Check cardiac echo r/o pulmonary htn Start on lasix iv  Hypoxia  Likely secondary to osa Cont to monitor, use o2   Pafib with rvr cardizem Check tsh, trop i q6h x3 Check echo  Ckd stage 3 Check cmp in am  Anemia Check cbc in am  Dm2 fsbs ac and qhs, iss  DVT prophylaxis:  Cy Blamer 12/04/2014, 10:00 PM

## 2014-12-04 NOTE — ED Notes (Signed)
Pt stated he did not want to stand up or walk because his knee hurts, pt also stated he know what happens when he gets up his O2 level drops.

## 2014-12-04 NOTE — ED Notes (Signed)
Pt in X ray

## 2014-12-05 ENCOUNTER — Ambulatory Visit (HOSPITAL_COMMUNITY): Payer: Medicare Other

## 2014-12-05 DIAGNOSIS — E1122 Type 2 diabetes mellitus with diabetic chronic kidney disease: Secondary | ICD-10-CM | POA: Diagnosis present

## 2014-12-05 DIAGNOSIS — E785 Hyperlipidemia, unspecified: Secondary | ICD-10-CM | POA: Diagnosis present

## 2014-12-05 DIAGNOSIS — I129 Hypertensive chronic kidney disease with stage 1 through stage 4 chronic kidney disease, or unspecified chronic kidney disease: Secondary | ICD-10-CM | POA: Diagnosis present

## 2014-12-05 DIAGNOSIS — Z794 Long term (current) use of insulin: Secondary | ICD-10-CM | POA: Diagnosis not present

## 2014-12-05 DIAGNOSIS — I252 Old myocardial infarction: Secondary | ICD-10-CM | POA: Diagnosis not present

## 2014-12-05 DIAGNOSIS — E1165 Type 2 diabetes mellitus with hyperglycemia: Secondary | ICD-10-CM | POA: Diagnosis present

## 2014-12-05 DIAGNOSIS — I2510 Atherosclerotic heart disease of native coronary artery without angina pectoris: Secondary | ICD-10-CM | POA: Diagnosis present

## 2014-12-05 DIAGNOSIS — N179 Acute kidney failure, unspecified: Secondary | ICD-10-CM | POA: Diagnosis not present

## 2014-12-05 DIAGNOSIS — M25569 Pain in unspecified knee: Secondary | ICD-10-CM | POA: Diagnosis present

## 2014-12-05 DIAGNOSIS — I481 Persistent atrial fibrillation: Secondary | ICD-10-CM | POA: Diagnosis not present

## 2014-12-05 DIAGNOSIS — R0600 Dyspnea, unspecified: Secondary | ICD-10-CM

## 2014-12-05 DIAGNOSIS — R0902 Hypoxemia: Secondary | ICD-10-CM

## 2014-12-05 DIAGNOSIS — E118 Type 2 diabetes mellitus with unspecified complications: Secondary | ICD-10-CM | POA: Diagnosis not present

## 2014-12-05 DIAGNOSIS — I480 Paroxysmal atrial fibrillation: Secondary | ICD-10-CM | POA: Diagnosis present

## 2014-12-05 DIAGNOSIS — J9601 Acute respiratory failure with hypoxia: Secondary | ICD-10-CM

## 2014-12-05 DIAGNOSIS — Z79899 Other long term (current) drug therapy: Secondary | ICD-10-CM | POA: Diagnosis not present

## 2014-12-05 DIAGNOSIS — J9621 Acute and chronic respiratory failure with hypoxia: Secondary | ICD-10-CM | POA: Diagnosis present

## 2014-12-05 DIAGNOSIS — Z7901 Long term (current) use of anticoagulants: Secondary | ICD-10-CM | POA: Diagnosis not present

## 2014-12-05 DIAGNOSIS — G4733 Obstructive sleep apnea (adult) (pediatric): Secondary | ICD-10-CM | POA: Diagnosis present

## 2014-12-05 DIAGNOSIS — Z79891 Long term (current) use of opiate analgesic: Secondary | ICD-10-CM | POA: Diagnosis not present

## 2014-12-05 DIAGNOSIS — Z87891 Personal history of nicotine dependence: Secondary | ICD-10-CM | POA: Diagnosis not present

## 2014-12-05 DIAGNOSIS — G5602 Carpal tunnel syndrome, left upper limb: Secondary | ICD-10-CM | POA: Diagnosis present

## 2014-12-05 DIAGNOSIS — I4891 Unspecified atrial fibrillation: Secondary | ICD-10-CM | POA: Diagnosis not present

## 2014-12-05 DIAGNOSIS — M545 Low back pain: Secondary | ICD-10-CM | POA: Diagnosis present

## 2014-12-05 DIAGNOSIS — D509 Iron deficiency anemia, unspecified: Secondary | ICD-10-CM | POA: Diagnosis present

## 2014-12-05 DIAGNOSIS — Z6841 Body Mass Index (BMI) 40.0 and over, adult: Secondary | ICD-10-CM | POA: Diagnosis not present

## 2014-12-05 DIAGNOSIS — E1140 Type 2 diabetes mellitus with diabetic neuropathy, unspecified: Secondary | ICD-10-CM | POA: Diagnosis present

## 2014-12-05 DIAGNOSIS — N183 Chronic kidney disease, stage 3 (moderate): Secondary | ICD-10-CM | POA: Diagnosis present

## 2014-12-05 DIAGNOSIS — K088 Other specified disorders of teeth and supporting structures: Secondary | ICD-10-CM | POA: Diagnosis present

## 2014-12-05 DIAGNOSIS — I5033 Acute on chronic diastolic (congestive) heart failure: Secondary | ICD-10-CM | POA: Diagnosis present

## 2014-12-05 DIAGNOSIS — G8929 Other chronic pain: Secondary | ICD-10-CM | POA: Diagnosis present

## 2014-12-05 DIAGNOSIS — Z955 Presence of coronary angioplasty implant and graft: Secondary | ICD-10-CM | POA: Diagnosis not present

## 2014-12-05 DIAGNOSIS — G5601 Carpal tunnel syndrome, right upper limb: Secondary | ICD-10-CM | POA: Diagnosis present

## 2014-12-05 DIAGNOSIS — N508 Other specified disorders of male genital organs: Secondary | ICD-10-CM | POA: Diagnosis present

## 2014-12-05 DIAGNOSIS — Z9119 Patient's noncompliance with other medical treatment and regimen: Secondary | ICD-10-CM | POA: Diagnosis present

## 2014-12-05 DIAGNOSIS — M1389 Other specified arthritis, multiple sites: Secondary | ICD-10-CM | POA: Diagnosis present

## 2014-12-05 LAB — GLUCOSE, CAPILLARY
GLUCOSE-CAPILLARY: 423 mg/dL — AB (ref 65–99)
GLUCOSE-CAPILLARY: 64 mg/dL — AB (ref 65–99)
GLUCOSE-CAPILLARY: 58 mg/dL — AB (ref 65–99)
GLUCOSE-CAPILLARY: 99 mg/dL (ref 65–99)
Glucose-Capillary: 372 mg/dL — ABNORMAL HIGH (ref 65–99)
Glucose-Capillary: 99 mg/dL (ref 65–99)
Glucose-Capillary: 141 mg/dL — ABNORMAL HIGH (ref 65–99)
Glucose-Capillary: 56 mg/dL — ABNORMAL LOW (ref 65–99)

## 2014-12-05 LAB — CBC
HEMATOCRIT: 36.4 % — AB (ref 39.0–52.0)
Hemoglobin: 11.2 g/dL — ABNORMAL LOW (ref 13.0–17.0)
MCH: 23 pg — ABNORMAL LOW (ref 26.0–34.0)
MCHC: 30.8 g/dL (ref 30.0–36.0)
MCV: 74.7 fL — AB (ref 78.0–100.0)
Platelets: 338 10*3/uL (ref 150–400)
RBC: 4.87 MIL/uL (ref 4.22–5.81)
RDW: 18.4 % — AB (ref 11.5–15.5)
WBC: 11.8 10*3/uL — ABNORMAL HIGH (ref 4.0–10.5)

## 2014-12-05 LAB — COMPREHENSIVE METABOLIC PANEL
ALK PHOS: 63 U/L (ref 38–126)
ALT: 15 U/L — AB (ref 17–63)
AST: 20 U/L (ref 15–41)
Albumin: 3.4 g/dL — ABNORMAL LOW (ref 3.5–5.0)
Anion gap: 11 (ref 5–15)
BILIRUBIN TOTAL: 0.7 mg/dL (ref 0.3–1.2)
BUN: 14 mg/dL (ref 6–20)
CHLORIDE: 97 mmol/L — AB (ref 101–111)
CO2: 28 mmol/L (ref 22–32)
Calcium: 8.7 mg/dL — ABNORMAL LOW (ref 8.9–10.3)
Creatinine, Ser: 1.41 mg/dL — ABNORMAL HIGH (ref 0.61–1.24)
GFR calc Af Amer: 60 mL/min — ABNORMAL LOW (ref >60)
GFR calc non Af Amer: 52 mL/min — ABNORMAL LOW (ref >60)
GLUCOSE: 406 mg/dL — AB (ref 65–99)
Potassium: 3.9 mmol/L (ref 3.5–5.1)
SODIUM: 136 mmol/L (ref 135–145)
Total Protein: 7.5 g/dL (ref 6.5–8.1)

## 2014-12-05 LAB — TROPONIN I
Troponin I: <0.03 ng/mL (ref <0.031)
Troponin I: 0.03 ng/mL (ref <0.031)

## 2014-12-05 LAB — PROTIME-INR
INR: 2.66 — AB (ref 0.00–1.49)
Prothrombin Time: 27.9 seconds — ABNORMAL HIGH (ref 11.6–15.2)

## 2014-12-05 LAB — TSH: TSH: 0.471 u[IU]/mL (ref 0.350–4.500)

## 2014-12-05 MED ORDER — INSULIN REGULAR HUMAN (CONC) 500 UNIT/ML ~~LOC~~ SOLN
70.0000 [IU] | Freq: Three times a day (TID) | SUBCUTANEOUS | Status: DC
  Filled 2014-12-05: qty 20

## 2014-12-05 MED ORDER — OXYCODONE HCL 5 MG PO TABS
30.0000 mg | ORAL_TABLET | Freq: Four times a day (QID) | ORAL | Status: DC | PRN
  Administered 2014-12-05 – 2014-12-07 (×9): 30 mg via ORAL
  Filled 2014-12-05 (×9): qty 6

## 2014-12-05 MED ORDER — INSULIN REGULAR HUMAN (CONC) 500 UNIT/ML ~~LOC~~ SOLN
70.0000 [IU] | Freq: Three times a day (TID) | SUBCUTANEOUS | Status: DC
  Filled 2014-12-05 (×2): qty 20

## 2014-12-05 MED ORDER — AMIODARONE HCL 200 MG PO TABS
400.0000 mg | ORAL_TABLET | Freq: Two times a day (BID) | ORAL | Status: DC
  Administered 2014-12-05 – 2014-12-11 (×13): 400 mg via ORAL
  Filled 2014-12-05 (×15): qty 2

## 2014-12-05 MED ORDER — INSULIN GLARGINE 100 UNIT/ML ~~LOC~~ SOLN
60.0000 [IU] | Freq: Every day | SUBCUTANEOUS | Status: DC
  Filled 2014-12-05 (×2): qty 0.6

## 2014-12-05 MED ORDER — WARFARIN SODIUM 5 MG PO TABS
5.0000 mg | ORAL_TABLET | Freq: Once | ORAL | Status: AC
  Administered 2014-12-05: 5 mg via ORAL
  Filled 2014-12-05: qty 1

## 2014-12-05 MED ORDER — INSULIN REGULAR HUMAN (CONC) 500 UNIT/ML ~~LOC~~ SOLN
120.0000 [IU] | Freq: Once | SUBCUTANEOUS | Status: AC
  Administered 2014-12-05: 120 [IU] via SUBCUTANEOUS
  Filled 2014-12-05: qty 20

## 2014-12-05 NOTE — Progress Notes (Signed)
ANTICOAGULATION CONSULT NOTE  Pharmacy Consult for warfarin Indication: atrial fibrillation  No Known Allergies  Patient Measurements: Height: 6' (182.9 cm) Weight: (!) 377 lb 12.8 oz (171.369 kg) IBW/kg (Calculated) : 77.6 Heparin Dosing Weight:   Labs:  Recent Labs  12/04/14 1457 12/04/14 1756 12/05/14 12/05/14 0430  HGB  --  11.6*  --  11.2*  HCT  --  36.9*  --  36.4*  PLT  --  341  --  338  LABPROT  --   --   --  27.9*  INR 4.2  --   --  2.66*  CREATININE  --  1.33*  --  1.41*  TROPONINI  --   --  <0.03 0.03    Estimated Creatinine Clearance: 87.3 mL/min (by C-G formula based on Cr of 1.41).   Assessment: 26 yom presented to the hospital with dyspnea. He is on chronic coumadin for hx of afib. INR today was elevated at coumadin clinic on admission. INR today back down to 2.66 Home dose = 5 mg daily with 7.5 mg Tuesday and Thursday  Goal of Therapy:  INR 2-3 Monitor platelets by anticoagulation protocol: Yes   Plan:  Coumadin 5 mg po x 1 dose today Daily INR  Thank you. Okey Regal, PharmD [phone] 12/05/2014,10:41 AM

## 2014-12-05 NOTE — Progress Notes (Signed)
MEDICATION RELATED NOTE   Pharmacy Re: Insulin Indication:  High Risk Concentrated Insulin   Medications:  Prescriptions prior to admission  Medication Sig Dispense Refill Last Dose  . ACCU-CHEK AVIVA PLUS test strip   0 11/26/2014 at Unknown time  . ACCU-CHEK FASTCLIX LANCETS MISC    11/26/2014 at Unknown time  . BD INSULIN SYRINGE ULTRAFINE 31G X 15/64" 0.3 ML MISC   0 11/26/2014 at Unknown time  . Ferrous Sulfate (IRON) 325 (65 FE) MG TABS Take 1 tablet by mouth daily.   11/25/2014  . furosemide (LASIX) 40 MG tablet Take 1 tablet (40 mg total) by mouth 2 (two) times daily. 10 tablet 0   . furosemide (LASIX) 80 MG tablet Take 1 tablet (80 mg total) by mouth 2 (two) times daily. 60 tablet 5 11/25/2014  . gabapentin (NEURONTIN) 300 MG capsule Take 1,200 mg by mouth 2 (two) times daily.    11/25/2014  . Hypromellose (NATURAL BALANCE TEARS OP) Place 1 drop into both eyes daily as needed.   11/25/2014  . insulin regular human CONCENTRATED (HUMULIN R) 500 UNIT/ML SOLN injection Inject 90-120 Units into the skin 3 (three) times daily with meals.    11/26/2014 at Unknown time  . lisinopril (PRINIVIL,ZESTRIL) 10 MG tablet Take 1 tablet by mouth daily.   not started  . metoprolol tartrate (LOPRESSOR) 25 MG tablet Take 1 tablet (25 mg total) by mouth 2 (two) times daily. 60 tablet 5 11/25/2014 at 1800  . Multiple Vitamin (MULTIVITAMIN WITH MINERALS) TABS Take 1 tablet by mouth daily.   11/25/2014  . oxycodone (ROXICODONE) 30 MG immediate release tablet Take 30 mg by mouth 4 (four) times daily.    Past Week at Unknown time  . Podiatric Products (DIADERM FOOT REJUVENATING) CREA Apply 1 application topically daily as needed (dry skin).   Past Week at Unknown time  . potassium chloride SA (KLOR-CON M20) 20 MEQ tablet Take 1 tablet (20 mEq total) by mouth 2 (two) times daily. 60 tablet 6   . rosuvastatin (CRESTOR) 40 MG tablet Take 40 mg by mouth daily.   11/25/2014  . spironolactone (ALDACTONE) 25 MG tablet Take 1  tablet (25 mg total) by mouth daily. 30 tablet 2 11/26/2014 at Unknown time  . TRULICITY 0.75 MG/0.5ML SOPN Take 1 tablet by mouth once a week.   11/25/2014  . warfarin (COUMADIN) 5 MG tablet TAKE AS DIRECTED BY COUMADIN CLINIC (Patient taking differently: Take 5mg  everyday except Tuesday and Thursday take 7.5 mg) 45 tablet 2 11/25/2014 at 1800    Assessment: 64yo male with diabetes requiring high dose insulin therapy and uses U-500 concentration at home.  I spoke with him this morning and clarified his dose regimen.  He uses a sliding scale as follows:  CBG 200-250 = 70 units CBG 251-300 = 90 units CBG > 301 = 120 units (this dose was just increased from 100 units)  I confirmed with Dr. 11/27/2014 to cover his CBG this morning which was 423.  He plans to get a diabetes consult as well.  Plan:  - Give 120 units of U-500 Humulin R for CBG of 423 (this was confirmed by RN) - F/u diabetes consult recommendations and MD plan for ongoing coverage  [name], PharmD., MS Clinical Pharmacist Pager:  [phone] Thank you for allowing pharmacy to be part of this patients care team. 12/05/2014,8:48 AM

## 2014-12-05 NOTE — Progress Notes (Signed)
Patient: [name] / Admit Date: 12/04/2014 / Date of Encounter: 12/05/2014, 12:05 PM   Subjective: Feeling somewhat better today. He denies dyspnea at rest and states that he was able to sleep pretty well last night. He is not having palpitations but says that he can feel that he's in afib. No chest pain or PND. Mild orthopnea overnight. Minimal testicular pain. Moderate dental pain - requires pain med around the clock for this. Still gets SOB with transfers - witnessed him transferring from wheelchair to bedside chair and he still gets dyspneic but this is definitely better than yesterday.  He denies intentionally losing weight recently.  Objective: Telemetry: Atrial fib rates 70s-90s, up to 120s last night briefly Physical Exam: Blood pressure 108/69, pulse 63, temperature 97.3 F (36.3 C), temperature source Oral, resp. rate 18, height 6' (1.829 m), weight 377 lb 12.8 oz (171.369 kg), SpO2 94 %. General: Well developed morbidly obese WM, in no acute distress. Head: Normocephalic, atraumatic, sclera non-icteric, no xanthomas, nares are without discharge. Neck: JVP not elevated. Lungs: Clear bilaterally to auscultation without wheezes, rales, or rhonchi. Breathing is unlabored. Heart: Irregularly irregular, S1 S2 without murmurs, rubs, or gallops.  Abdomen: Soft, non-tender, non-distended with normoactive bowel sounds. No rebound/guarding. Extremities: No clubbing or cyanosis. 2+ edema extending up to the scrotum with chronic LE venous stasis changes/chronic skin thickening and baseline large leg habitus. Neuro: Alert and oriented X 3. Moves all extremities spontaneously. Psych:  Responds to questions appropriately with a normal affect.   Intake/Output Summary (Last 24 hours) at 12/05/14 1205 Last data filed at 12/05/14 0859  Gross per 24 hour  Intake   1340 ml  Output   3750 ml  Net  -2410 ml    Inpatient Medications:  . ferrous sulfate  325 mg Oral Daily  . furosemide  80  mg Intravenous Q12H  . gabapentin  1,200 mg Oral BID  . insulin aspart  0-15 Units Subcutaneous TID WC  . insulin aspart  0-5 Units Subcutaneous QHS  . insulin regular human CONCENTRATED  70-120 Units Subcutaneous TID WC  . lisinopril  10 mg Oral Daily  . metoprolol tartrate  25 mg Oral BID  . potassium chloride SA  20 mEq Oral BID  . rosuvastatin  40 mg Oral Daily  . sodium chloride  3 mL Intravenous Q12H  . sodium chloride  3 mL Intravenous Q12H  . spironolactone  25 mg Oral Daily  . warfarin  5 mg Oral ONCE-1800  . Warfarin - Pharmacist Dosing Inpatient   Does not apply q1800   Infusions:    Labs:  Recent Labs  12/04/14 1756 12/05/14 0430  NA 139 136  K 4.0 3.9  CL 101 97*  CO2 26 28  GLUCOSE 220* 406*  BUN 14 14  CREATININE 1.33* 1.41*  CALCIUM 8.9 8.7*    Recent Labs  12/05/14 0430  AST 20  ALT 15*  ALKPHOS 63  BILITOT 0.7  PROT 7.5  ALBUMIN 3.4*    Recent Labs  12/04/14 1756 12/05/14 0430  WBC 14.1* 11.8*  NEUTROABS 10.2*  --   HGB 11.6* 11.2*  HCT 36.9* 36.4*  MCV 74.2* 74.7*  PLT 341 338    Recent Labs  12/05/14 12/05/14 0430 12/05/14 1031  TROPONINI <0.03 0.03 <0.03   Invalid input(s): POCBNP No results for input(s): HGBA1C in the last 72 hours.   Radiology/Studies:  Dg Chest 2 View  12/04/2014   CLINICAL DATA:  Chronic shortness of breath  with exertion, acute onset of cough approximately 2 days ago. Prior coronary artery stenting in 2010. Current history of hypertension, diabetes and atrial fibrillation.  EXAM: CHEST  2 VIEW  COMPARISON:  09/20/2014 and earlier.  FINDINGS: AP erect and lateral imaging was performed. Cardiac silhouette markedly enlarged but stable. Thoracic aorta atherosclerotic, unchanged. Hilar and mediastinal contours otherwise unremarkable. Mild pulmonary venous hypertension, unchanged, without evidence of pulmonary edema. Lungs clear. No pleural effusions. Degenerative changes involving the thoracic spine. No  significant interval change dating back to July, 2013.  IMPRESSION: Stable cardiomegaly.  No acute cardiopulmonary disease.   Electronically Signed   By: Hulan Saas M.D.   On: 12/04/2014 19:24   US Scrotum  11/27/2014   CLINICAL DATA:  Groin swelling. Fall yesterday, now with testicular pain and swelling.  EXAM: SCROTAL ULTRASOUND  DOPPLER ULTRASOUND OF THE TESTICLES  TECHNIQUE: Complete ultrasound examination of the testicles, epididymis, and other scrotal structures was performed. Color and spectral Doppler ultrasound were also utilized to evaluate blood flow to the testicles.  COMPARISON:  None.  FINDINGS: Right testicle  Measurements: 3.5 x 2.1 x 2.1 cm. No mass or microlithiasis visualized. No testicular hematoma. Homogeneous blood flow noted.  Left testicle  Measurements: 3.8 x 1.9 x 2.4 cm. No mass or microlithiasis visualized. No testicular hematoma. Homogeneous blood flow noted.  Right epididymis:  Normal where visualized, tail obscured.  Left epididymis:  Not well seen.  Hydrocele:  None visualized.  Varicocele:  None visualized.  Scrotal skin: Diffuse skin and subcutaneous edema is seen. No fluid collection. There is fat within both inguinal canals.  Pulsed Doppler interrogation of both testes demonstrates normal low resistance arterial and venous waveforms bilaterally.  IMPRESSION: 1. Diffuse skin and subcutaneous edema of the scrotum. No focal fluid collection. 2. Normal appearance of both testis.  Normal blood flow.   Electronically Signed   By: Rubye Oaks M.D.   On: 11/27/2014 03:01   [place] Art/ven Flow Abd Pelv Doppler  11/27/2014   CLINICAL DATA:  Groin swelling. Fall yesterday, now with testicular pain and swelling.  EXAM: SCROTAL ULTRASOUND  DOPPLER ULTRASOUND OF THE TESTICLES  TECHNIQUE: Complete ultrasound examination of the testicles, epididymis, and other scrotal structures was performed. Color and spectral Doppler ultrasound were also utilized to evaluate blood flow to the  testicles.  COMPARISON:  None.  FINDINGS: Right testicle  Measurements: 3.5 x 2.1 x 2.1 cm. No mass or microlithiasis visualized. No testicular hematoma. Homogeneous blood flow noted.  Left testicle  Measurements: 3.8 x 1.9 x 2.4 cm. No mass or microlithiasis visualized. No testicular hematoma. Homogeneous blood flow noted.  Right epididymis:  Normal where visualized, tail obscured.  Left epididymis:  Not well seen.  Hydrocele:  None visualized.  Varicocele:  None visualized.  Scrotal skin: Diffuse skin and subcutaneous edema is seen. No fluid collection. There is fat within both inguinal canals.  Pulsed Doppler interrogation of both testes demonstrates normal low resistance arterial and venous waveforms bilaterally.  IMPRESSION: 1. Diffuse skin and subcutaneous edema of the scrotum. No focal fluid collection. 2. Normal appearance of both testis.  Normal blood flow.   Electronically Signed   By: Rubye Oaks M.D.   On: 11/27/2014 03:01   Dg Knee Complete 4 Views Left  11/27/2014   CLINICAL DATA:  Status post fall, with left knee pain. Initial encounter.  EXAM: LEFT KNEE - COMPLETE 4+ VIEW  COMPARISON:  None.  FINDINGS: There is no evidence of fracture or dislocation. The joint  spaces are preserved. Chondrocalcinosis is noted; the patellofemoral joint is grossly unremarkable in appearance. A fabella is seen.  No significant joint effusion is seen. The visualized soft tissues are normal in appearance.  IMPRESSION: 1. No evidence of fracture or dislocation. 2. Chondrocalcinosis noted.   Electronically Signed   By: Roanna Raider M.D.   On: 11/27/2014 03:05   Dg Knee Complete 4 Views Right  11/27/2014   CLINICAL DATA:  Status post fall; right knee pain. Initial encounter.  EXAM: RIGHT KNEE - COMPLETE 4+ VIEW  COMPARISON:  None.  FINDINGS: There is no evidence of fracture or dislocation. The joint spaces are preserved. There is narrowing of the patellofemoral compartment, with marginal osteophytes seen arising  at all three compartments. Wall osteophytes are also noted. Underlying mild chondrocalcinosis seen. A fabella is noted.  No significant joint effusion is seen. Mild suprapatellar soft tissue swelling is suggested.  IMPRESSION: 1. No evidence of fracture or dislocation. 2. 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 3. Underlying mild chondrocalcinosis noted.   Electronically Signed   By: Roanna Raider M.D.   On: 11/27/2014 03:07     Assessment and Plan  1. Acute hypoxic respiratory failure: currently 94% on RA at rest but will need to ambulate to assess O2 requirements as this is when he became significantly hypoxic, tachypneic and tachycardic yesterday. Although weight is down compared to prior, he has begun to diurese some. OHS/OSA may also be contributing. The patient voices interest in trying CPAP again despite previous intolerance. PT eval.  2. PAF: He was previously in SR on 09/24/14 following cardioversion, back in AF RVR at time of ED visit 11/27/14 and noted to be in AF at OV yesterday in clinic. HR is well controlled when resting. Given his deconditioned state his HR may be appropriately increasing with activity. Rate well-controlled on Lopressor 25 mg BID. INR therapeutic today at 2.66 (was 4.2 on admission), continue warfarin per pharmacy dosing. Will discuss possibility of DCCV vs AAD with Dr. [name]. Suspect AF may be difficult to control given body habitus and noncompliance with CPAP.  3. Acute on chronic diastolic CHF: Echo 3/16 showing normal systolic function, LVEF 55-60%. Grade 1 diastolic dysfunction. Discharge weight in 09/2014 was 396 -> when seen in the ER the other day he was 372. He was 377.8 yesterday on admission to [facility]. Suspect he's had some degree of actual body weight loss since that time (?intentional versus due to elevated CBGs). Volume status difficult to assess given morbid obesity. As he is diuresing and clinically improving on  IV Lasix would continue through today. F/u labs in AM. BNP surprisingly not that high but it wasn't high when he was in the hospital 09/2014 either. The patient told DM coordinator he drinks a significant amount of juice daily.  4. CAD: s/p PCI to RCA in 2012, prior cath in 01/2014 with elevated L/RH pressures, mild-mod CAD of LAD/LCx with patent RCA.  Troponins negative x3 this hospitalization. No chest pain or associated anginal symptoms during hospitalization. Continue beta blocker and statin therapy. Not on ASA due to concomitant Coumadin.  5. CKD stage III: renal function stable, recheck BMP tomorrow.   6. DM: labile CBG recently with hypoglycemia when seen in ER the other day. BSG was 406 this morning. Internal medicine following.    Signed, Ronie Spies PA-C Pager: [phone] As above, patient seen and examined. Patient presented with increasing dyspnea on exertion, lower extremity edema. He is back in atrial  fibrillation. His INR has been therapeutic for greater than 4 weeks. I think some of his symptoms are related to recurrent atrial fibrillation and he did have recent cardioversion. Plan to initiate amiodarone 400 mg by mouth twice a day. Once he is loaded we will plan to proceed with repeat cardioversion. Hopefully he will hold sinus rhythm which will improve his diastolic congestive heart failure. Continue IV Lasix for now and follow renal function. Obesity and obstructive sleep apnea also likely contributing to dyspnea. Olga Millers

## 2014-12-05 NOTE — Progress Notes (Signed)
[name] WPY:[id number] DOB: 08/11/1950 DOA: 12/04/2014 PCP: [name], MD  Brief narrative: 64 y/o ? P afib chad=3 s/p prior CV in 2012 then DCCV 09/23/14, diastolic CHF c Echo EF55%, cellulitis, OSA not on CPAP 2/2 to intolerance, DM ty 2, HLd, microcytic anemia vs AoCD?, CKD III, prior pilonidal cyst excision 12/2012 Seen 5/18 in ED c scrotal swelling discomfort, mild doe-told to ? lasix to 120 bid.  NON COMPLIANT with those recs. Sent over from Cardiology office and aggressively diuresed  Past medical history-As per Problem list Chart reviewed as below-   Consultants:  Cardiology  Procedures:  None   Antibiotics:  none   Subjective  Fair Breathing much better Dr. [name] is endocrinologist guiding his DM care No n/v/cp No unilat weak    Objective    Interim History:   Telemetry: afib   Objective: Filed Vitals:   12/04/14 2228 12/04/14 2303 12/04/14 2305 12/05/14 0519  BP: 145/82  135/75 108/69  Pulse:   101 63  Temp:   97.8 F (36.6 C) 97.3 F (36.3 C)  TempSrc:   Oral Oral  Resp:   16 18  Height:  6' (1.829 m)    Weight:  171.369 kg (377 lb 12.8 oz)    SpO2:   94% 94%    Intake/Output Summary (Last 24 hours) at 12/05/14 1102 Last data filed at 12/05/14 0859  Gross per 24 hour  Intake   1340 ml  Output   3750 ml  Net  -2410 ml    Exam:  General: Morbid obesity, Body mass index is 51.23 kg/(m^2). Cardiovascular: s1 s 2 no m/r/g-cannot assess JVD given habitus Respiratory:  Clear no added sound Abdomen: obese Skin grade 2 le edema   Data Reviewed: Basic Metabolic Panel:  Recent Labs Lab 12/04/14 1756 12/05/14 0430  NA 139 136  K 4.0 3.9  CL 101 97*  CO2 26 28  GLUCOSE 220* 406*  BUN 14 14  CREATININE 1.33* 1.41*  CALCIUM 8.9 8.7*   Liver Function Tests:  Recent Labs Lab 12/05/14 0430  AST 20  ALT 15*  ALKPHOS 63  BILITOT 0.7  PROT 7.5  ALBUMIN 3.4*   No results for input(s): LIPASE, AMYLASE in the last 168  hours. No results for input(s): AMMONIA in the last 168 hours. CBC:  Recent Labs Lab 12/04/14 1756 12/05/14 0430  WBC 14.1* 11.8*  NEUTROABS 10.2*  --   HGB 11.6* 11.2*  HCT 36.9* 36.4*  MCV 74.2* 74.7*  PLT 341 338   Cardiac Enzymes:  Recent Labs Lab 12/05/14 12/05/14 0430  TROPONINI <0.03 0.03   BNP: Invalid input(s): POCBNP CBG:  Recent Labs Lab 12/04/14 1741 12/04/14 2317 12/05/14 0632 12/05/14 1049  GLUCAP 211* 295* 423* 372*    No results found for this or any previous visit (from the past 240 hour(s)).   Studies:              All Imaging reviewed and is as per above notation   Scheduled Meds: . ferrous sulfate  325 mg Oral Daily  . furosemide  80 mg Intravenous Q12H  . gabapentin  1,200 mg Oral BID  . insulin aspart  0-15 Units Subcutaneous TID WC  . insulin aspart  0-5 Units Subcutaneous QHS  . insulin regular human CONCENTRATED  70-120 Units Subcutaneous TID WC  . lisinopril  10 mg Oral Daily  . metoprolol tartrate  25 mg Oral BID  . potassium chloride SA  20 mEq Oral  BID  . rosuvastatin  40 mg Oral Daily  . sodium chloride  3 mL Intravenous Q12H  . sodium chloride  3 mL Intravenous Q12H  . spironolactone  25 mg Oral Daily  . warfarin  5 mg Oral ONCE-1800  . Warfarin - Pharmacist Dosing Inpatient   Does not apply q1800   Continuous Infusions:    Assessment/Plan: 1. AECHF/P.AFib-per cardiology 2. DM ty 2 x 27 yrs-Continue U500 per home regimen-as not drinking or eating as much-agree may need dose reductrion with less PO intake.  Continue personalized U500 ssi with close monitoring with hypoglycenmic protocl.  WOuld consider insulin pump as OP-continue Gabpentin for neuropathy 3. OSA-get night time sat and might qualify for cpap 4. Morbid obesity, Body mass index is 51.23 kg/(m^2).-life threatening 5. HLD-continue Crestor 40 daily  Code Status: full Family Communication:  None bedsdie Disposition Plan: inpatient   Pleas Koch, MD  [facility] Pager [phone] 12/05/2014, 11:02 AM

## 2014-12-05 NOTE — Progress Notes (Addendum)
CBG is 423 this AM. Paged Schorr, NP to let her know as the order reads to page if >400.  Will await orders.   Schorr, NP said to wait until lab glucose result appears and then contact them back for further orders.

## 2014-12-05 NOTE — Evaluation (Signed)
Physical Therapy Evaluation Patient Details Name: [name] MRN: [id number] DOB: 1950-09-24 Today's Date: 12/05/2014   History of Present Illness  Patient is a 64 yo male admitted 12/04/14 with hypoxia and acute resp failure.  PMH:  PAF, CHF, CAD, MI, HTN, DM, neuropathy, OSA on CPAP, back pain, obesity, CKD  Clinical Impression  Patient presents with problems listed below.  Will benefit from acute PT to maximize functional independence prior to return home.  Patient with slightly unsteady gait (partially due to neuropathy).  Will be at home alone for portion of the day.  Will need to function at Mod I level to return home safely.  Patient close to this level, and should achieve prior to discharge.    Follow Up Recommendations No PT follow up;Supervision - Intermittent    Equipment Recommendations  None recommended by PT    Recommendations for Other Services       Precautions / Restrictions Precautions Precautions: Fall Precaution Comments: Prior Lt knee injury - unsteady Restrictions Weight Bearing Restrictions: No      Mobility  Bed Mobility               General bed mobility comments: Patient in chair as PT entered room  Transfers Overall transfer level: Needs assistance Equipment used: None Transfers: Sit to/from Stand Sit to Stand: Supervision         General transfer comment: Assist for safety  Ambulation/Gait Ambulation/Gait assistance: Min guard Ambulation Distance (Feet): 260 Feet Assistive device: None Gait Pattern/deviations: Step-through pattern;Decreased stride length;Drifts right/left;Wide base of support Gait velocity: Decreased Gait velocity interpretation: Below normal speed for age/gender General Gait Details: Patient with slightly unsteady gait, drifting Lt/Rt.  O2 sats with ambulation on room air were 91%.  Returned to 96% with rest and pursed-lip breathing.  Stairs            Wheelchair Mobility    Modified Rankin (Stroke  Patients Only)       Balance                                             Pertinent Vitals/Pain Pain Assessment: 0-10 Pain Score: 7  Pain Location: Lt knee, back  (also noted dental pain at 6/10) Pain Descriptors / Indicators: Aching;Pins and needles;Spasm Pain Intervention(s): Monitored during session;Repositioned;Premedicated before session    Home Living Family/patient expects to be discharged to:: Private residence Living Arrangements: Other relatives (Sister) Available Help at Discharge: Family;Available PRN/intermittently (Sister works) Type of Home: House Home Access: Stairs to enter Entrance Stairs-Rails: Engineer, manufacturing of Steps: 3 Home Layout: One level Home Equipment: Shower seat;Grab bars - tub/shower      Prior Function Level of Independence: Independent         Comments: Drives     Hand Dominance        Extremity/Trunk Assessment   Upper Extremity Assessment: Overall WFL for tasks assessed           Lower Extremity Assessment: Generalized weakness;RLE deficits/detail;LLE deficits/detail   LLE Deficits / Details: H/O knee injury impacting ROM and strength due to pain.     Communication   Communication: No difficulties  Cognition Arousal/Alertness: Awake/alert Behavior During Therapy: WFL for tasks assessed/performed Overall Cognitive Status: Within Functional Limits for tasks assessed  General Comments      Exercises        Assessment/Plan    PT Assessment Patient needs continued PT services  PT Diagnosis Abnormality of gait;Difficulty walking;Generalized weakness;Acute pain   PT Problem List Decreased strength;Decreased activity tolerance;Decreased balance;Decreased mobility;Cardiopulmonary status limiting activity;Impaired sensation;Obesity;Pain  PT Treatment Interventions Gait training;Functional mobility training;Therapeutic activities;Therapeutic  exercise;Patient/family education   PT Goals (Current goals can be found in the Care Plan section) Acute Rehab PT Goals Patient Stated Goal: To decrease pain PT Goal Formulation: With patient Time For Goal Achievement: 12/12/14 Potential to Achieve Goals: Good    Frequency Min 3X/week   Barriers to discharge Decreased caregiver support Sister works - will be home alone for part of the day.    Co-evaluation               End of Session   Activity Tolerance: Patient tolerated treatment well;Patient limited by fatigue Patient left: in chair;with call bell/phone within reach Nurse Communication: Mobility status         Time: 4081-4481 PT Time Calculation (min) (ACUTE ONLY): 18 min   Charges:   PT Evaluation $Initial PT Evaluation Tier I: 1 Procedure     PT G CodesVena Austria 12/26/2014, 3:54 PM Durenda Hurt. Renaldo Fiddler, [facility] Acute Rehab Services Pager [phone]

## 2014-12-05 NOTE — Progress Notes (Addendum)
Patient states that he takes 30mg  of oxy q4h (6x a day) for his chronic pain.  Currently it is scheduled as q6h (4x per day) and patient is upset about this.  Paged Schorr NP about what patient said.  Awaiting further orders if there are any changes.   Per patient chart records of medication patient takes oxy 30mg  q6h.  Schorr, NP changed the order to read q6h RPN instead of 4x a day for accurate timings.  Will notify patient of this update.

## 2014-12-05 NOTE — Progress Notes (Signed)
Inpatient Diabetes Program Recommendations  AACE/ADA: New Consensus Statement on Inpatient Glycemic Control (2013)  Target Ranges:  Prepandial:   less than 140 mg/dL      Peak postprandial:   less than 180 mg/dL (1-2 hours)      Critically ill patients:  140 - 180 mg/dL  Results for [name], [name] (MRN [id number]) as of 12/05/2014 10:54  Ref. Range 12/04/2014 17:41 12/04/2014 23:17 12/05/2014 06:32 12/05/2014 10:49  Glucose-Capillary Latest Ref Range: 65-99 mg/dL 122 (H) 449 (H) 753 (H) 372 (H)   Reason for Visit: Dyspnea  Diabetes history: DM 2 Outpatient Diabetes medications: U-500 (CBG 200-250 = 70 units, CBG 251-300 = 90 units, CBG > 301 = 120 units) Current orders for Inpatient glycemic control: Same scale for U-500, Novolog 0-15 units TID, Novolog 0-5 units QHS  Note:  Spoke with patient about diabetes and home regimen for diabetes control. Patient reports that he is followed by Dr. [name] (Endocrinology) for diabetes management. Patient reports drinking a 2 Liter of regular drink every day and not following a diabetic diet. He says he checks his glucose 4-5 times a day. He reports of seeing numbers from the 80's-425mg /dl. He says he chases his glucose all day. He mentioned he was on Lantus 70 units and Humalog 30-100 units TID and had a much better A1c around 7%. Last A1c by Dr. [name] was a little over 8%. Patient also reports taking an extra non-prescribed dose of U-500 overnight and then having to correct lows in the am. I advised him to only take what was prescribed.  Discussed the importance maintaining good CBG control to prevent long-term and short-term complications. Discussed impact of nutrition, exercise, stress, sickness, and medications on diabetes control.  Patient is unable to exercise due to SOB and hyperextending his knee awhile back. Patient states That he is actually liking the Sprite Zero and the sugar free orange aid. I encouraged him that if he liked a diet or Zero version of  a soft drink to stick with it instead of regular soda.  Discussed carbohydrates, carbohydrate goals per day and meal, along with portion sizes. Gave patient diabetes meal planning guide.   Patient states that Dr. [name] did not want him to go back on Lantus/Humalog regimen due to the volume of medication being injected. I discussed with patient possibly decreasing U-500 insulin by 20% since patient was not drinking soft drinks here and not eating the portions he usually does at home. Patient is agreeable if MD decides to adjust U-500 depending on CBG's. However, glucose trends are still elevated, 372 mg/dl. Patient verbalized understanding of information discussed and he states that he has no further questions at this time related to diabetes. Since patient is chasing glucose at home may be well controlled on home dose while here inpatient.  Thanks,  Christena Deem RN, MSN, [facility] Inpatient Diabetes Coordinator Team Pager [phone]

## 2014-12-05 NOTE — Progress Notes (Signed)
  Echocardiogram 2D Echocardiogram has been performed.  [name] 12/05/2014, 11:58 AM

## 2014-12-06 LAB — BASIC METABOLIC PANEL
Anion gap: 12 (ref 5–15)
BUN: 20 mg/dL (ref 6–20)
CALCIUM: 8.5 mg/dL — AB (ref 8.9–10.3)
CHLORIDE: 93 mmol/L — AB (ref 101–111)
CO2: 29 mmol/L (ref 22–32)
CREATININE: 1.74 mg/dL — AB (ref 0.61–1.24)
GFR calc Af Amer: 46 mL/min — ABNORMAL LOW (ref >60)
GFR calc non Af Amer: 40 mL/min — ABNORMAL LOW (ref >60)
GLUCOSE: 197 mg/dL — AB (ref 65–99)
Potassium: 3.7 mmol/L (ref 3.5–5.1)
Sodium: 134 mmol/L — ABNORMAL LOW (ref 135–145)

## 2014-12-06 LAB — GLUCOSE, CAPILLARY
GLUCOSE-CAPILLARY: 412 mg/dL — AB (ref 65–99)
Glucose-Capillary: 168 mg/dL — ABNORMAL HIGH (ref 65–99)
Glucose-Capillary: 433 mg/dL — ABNORMAL HIGH (ref 65–99)
Glucose-Capillary: 407 mg/dL — ABNORMAL HIGH (ref 65–99)

## 2014-12-06 LAB — CBC
HEMATOCRIT: 35.4 % — AB (ref 39.0–52.0)
HEMOGLOBIN: 11 g/dL — AB (ref 13.0–17.0)
MCH: 22.9 pg — ABNORMAL LOW (ref 26.0–34.0)
MCHC: 31.1 g/dL (ref 30.0–36.0)
MCV: 73.8 fL — ABNORMAL LOW (ref 78.0–100.0)
Platelets: 346 10*3/uL (ref 150–400)
RBC: 4.8 MIL/uL (ref 4.22–5.81)
RDW: 18 % — ABNORMAL HIGH (ref 11.5–15.5)
WBC: 13.6 10*3/uL — ABNORMAL HIGH (ref 4.0–10.5)

## 2014-12-06 LAB — PROTIME-INR
INR: 2.47 — AB (ref 0.00–1.49)
PROTHROMBIN TIME: 26.5 s — AB (ref 11.6–15.2)

## 2014-12-06 MED ORDER — POTASSIUM CHLORIDE CRYS ER 20 MEQ PO TBCR
20.0000 meq | EXTENDED_RELEASE_TABLET | Freq: Two times a day (BID) | ORAL | Status: AC
  Administered 2014-12-06 (×2): 20 meq via ORAL

## 2014-12-06 MED ORDER — WARFARIN SODIUM 5 MG PO TABS
5.0000 mg | ORAL_TABLET | Freq: Once | ORAL | Status: AC
  Administered 2014-12-06: 5 mg via ORAL
  Filled 2014-12-06: qty 1

## 2014-12-06 MED ORDER — INSULIN GLARGINE 100 UNIT/ML ~~LOC~~ SOLN
60.0000 [IU] | Freq: Every day | SUBCUTANEOUS | Status: DC
  Administered 2014-12-06: 60 [IU] via SUBCUTANEOUS
  Filled 2014-12-06: qty 0.6

## 2014-12-06 MED ORDER — INSULIN REGULAR HUMAN (CONC) 500 UNIT/ML ~~LOC~~ SOLN
50.0000 [IU] | Freq: Three times a day (TID) | SUBCUTANEOUS | Status: DC
  Administered 2014-12-06: 50 [IU] via SUBCUTANEOUS
  Filled 2014-12-06: qty 20

## 2014-12-06 MED ORDER — INSULIN ASPART 100 UNIT/ML ~~LOC~~ SOLN: 6.0000 [IU] | Freq: Three times a day (TID) | SUBCUTANEOUS | Status: DC

## 2014-12-06 MED ORDER — INSULIN ASPART 100 UNIT/ML ~~LOC~~ SOLN: 0.0000 [IU] | Freq: Three times a day (TID) | SUBCUTANEOUS | Status: DC

## 2014-12-06 NOTE — Progress Notes (Signed)
PT Cancellation Note  Patient Details Name: [name] MRN: [id number] DOB: 10/28/1950   Cancelled Treatment:    Reason Eval/Treat Not Completed: Pain limiting ability to participate Attempted to see patient x2 today.  Patient declined PT today due to pain in Lt knee.  Will return at later time for PT session.  Vena Austria 12/06/2014, 5:42 PM Durenda Hurt. Renaldo Fiddler, [facility] Acute Rehab Services Pager [phone]

## 2014-12-06 NOTE — Progress Notes (Signed)
ANTICOAGULATION CONSULT NOTE  Pharmacy Consult for warfarin Indication: atrial fibrillation  No Known Allergies  Patient Measurements: Height: 6' (182.9 cm) Weight: (!) 373 lb 14.4 oz (169.6 kg) IBW/kg (Calculated) : 77.6 Heparin Dosing Weight:   Labs:  Recent Labs  12/04/14 1457  12/04/14 1756 12/05/14 12/05/14 0430 12/05/14 1031 12/06/14 0517  HGB  --   < > 11.6*  --  11.2*  --  11.0*  HCT  --   --  36.9*  --  36.4*  --  35.4*  PLT  --   --  341  --  338  --  346  LABPROT  --   --   --   --  27.9*  --  26.5*  INR 4.2  --   --   --  2.66*  --  2.47*  CREATININE  --   --  1.33*  --  1.41*  --  1.74*  TROPONINI  --   --   --  <0.03 0.03 <0.03  --   < > = values in this interval not displayed.  Estimated Creatinine Clearance: 70.3 mL/min (by C-G formula based on Cr of 1.74).   Assessment: 47 yom presented to the hospital with dyspnea. He is on warfarin for hx of afib. INR was elevated at coumadin clinic on admission. INR therapeutic at 2.4.  Home dose = 5 mg daily with 7.5 mg Tuesday and Thursday  Goal of Therapy:  INR 2-3 Monitor platelets by anticoagulation protocol: Yes   Plan:  Coumadin 5 mg po x1 Daily INR    [name], PharmD, BCPS Clinical Pharmacist Pager: [phone] 12/06/2014 10:37 AM

## 2014-12-06 NOTE — Progress Notes (Addendum)
Patient: [name] / Admit Date: 12/04/2014 / Date of Encounter: 12/06/2014, 7:54 AM   Subjective: Steadily feeling better. Still c/o a lot of dental pain. Pulse ox 91% RA with PT yesterday. No CP. Unaware that HR is up this AM. In chair eating breakfast.  Objective: Telemetry: continued atrial fib - transient bradycardia into 50s while sleeping, 90s earlier this AM, now 105-115. Physical Exam: Blood pressure 102/43, pulse 140, temperature 98.1 F (36.7 C), temperature source Oral, resp. rate 18, height 6' (1.829 m), weight 373 lb 14.4 oz (169.6 kg), SpO2 90 %. General: Well developed morbidly obese WM, in no acute distress. Head: Normocephalic, atraumatic, sclera non-icteric, no xanthomas, nares are without discharge. Neck: JVP not elevated. Lungs: Clear bilaterally to auscultation without wheezes, rales, or rhonchi. Breathing is unlabored. Heart: Irregularly irregular, borderline tachycardic, S1 S2 without murmurs, rubs, or gallops.  Abdomen: Soft, non-tender, non-distended with normoactive bowel sounds. No rebound/guarding. Extremities: No clubbing or cyanosis. 1-2+ edema with chronic LE venous stasis changes/chronic skin thickening and baseline large leg habitus. Neuro: Alert and oriented X 3. Moves all extremities spontaneously. Psych: Responds to questions appropriately with a normal affect.  Intake/Output Summary (Last 24 hours) at 12/06/14 0754 Last data filed at 12/06/14 0750  Gross per 24 hour  Intake   1642 ml  Output   3400 ml  Net  -1758 ml    Inpatient Medications:  . amiodarone  400 mg Oral BID  . ferrous sulfate  325 mg Oral Daily  . furosemide  80 mg Intravenous Q12H  . gabapentin  1,200 mg Oral BID  . insulin aspart  0-15 Units Subcutaneous TID WC  . insulin aspart  0-5 Units Subcutaneous QHS  . insulin glargine  60 Units Subcutaneous QHS  . lisinopril  10 mg Oral Daily  . metoprolol tartrate  25 mg Oral BID  . potassium chloride SA  20 mEq Oral BID    . rosuvastatin  40 mg Oral Daily  . sodium chloride  3 mL Intravenous Q12H  . sodium chloride  3 mL Intravenous Q12H  . spironolactone  25 mg Oral Daily  . Warfarin - Pharmacist Dosing Inpatient   Does not apply q1800   Infusions:    Labs:  Recent Labs  12/05/14 0430 12/06/14 0517  NA 136 134*  K 3.9 3.7  CL 97* 93*  CO2 28 29  GLUCOSE 406* 197*  BUN 14 20  CREATININE 1.41* 1.74*  CALCIUM 8.7* 8.5*    Recent Labs  12/05/14 0430  AST 20  ALT 15*  ALKPHOS 63  BILITOT 0.7  PROT 7.5  ALBUMIN 3.4*    Recent Labs  12/04/14 1756 12/05/14 0430 12/06/14 0517  WBC 14.1* 11.8* 13.6*  NEUTROABS 10.2*  --   --   HGB 11.6* 11.2* 11.0*  HCT 36.9* 36.4* 35.4*  MCV 74.2* 74.7* 73.8*  PLT 341 338 346    Recent Labs  12/05/14 12/05/14 0430 12/05/14 1031  TROPONINI <0.03 0.03 <0.03   Invalid input(s): POCBNP No results for input(s): HGBA1C in the last 72 hours.   Radiology/Studies:  Dg Chest 2 View  12/04/2014   CLINICAL DATA:  Chronic shortness of breath with exertion, acute onset of cough approximately 2 days ago. Prior coronary artery stenting in 2010. Current history of hypertension, diabetes and atrial fibrillation.  EXAM: CHEST  2 VIEW  COMPARISON:  09/20/2014 and earlier.  FINDINGS: AP erect and lateral imaging was performed. Cardiac silhouette markedly enlarged but stable. Thoracic aorta  atherosclerotic, unchanged. Hilar and mediastinal contours otherwise unremarkable. Mild pulmonary venous hypertension, unchanged, without evidence of pulmonary edema. Lungs clear. No pleural effusions. Degenerative changes involving the thoracic spine. No significant interval change dating back to July, 2013.  IMPRESSION: Stable cardiomegaly.  No acute cardiopulmonary disease.   Electronically Signed   By: Hulan Saas M.D.   On: 12/04/2014 19:24   US Scrotum  11/27/2014   CLINICAL DATA:  Groin swelling. Fall yesterday, now with testicular pain and swelling.  EXAM: SCROTAL  ULTRASOUND  DOPPLER ULTRASOUND OF THE TESTICLES  TECHNIQUE: Complete ultrasound examination of the testicles, epididymis, and other scrotal structures was performed. Color and spectral Doppler ultrasound were also utilized to evaluate blood flow to the testicles.  COMPARISON:  None.  FINDINGS: Right testicle  Measurements: 3.5 x 2.1 x 2.1 cm. No mass or microlithiasis visualized. No testicular hematoma. Homogeneous blood flow noted.  Left testicle  Measurements: 3.8 x 1.9 x 2.4 cm. No mass or microlithiasis visualized. No testicular hematoma. Homogeneous blood flow noted.  Right epididymis:  Normal where visualized, tail obscured.  Left epididymis:  Not well seen.  Hydrocele:  None visualized.  Varicocele:  None visualized.  Scrotal skin: Diffuse skin and subcutaneous edema is seen. No fluid collection. There is fat within both inguinal canals.  Pulsed Doppler interrogation of both testes demonstrates normal low resistance arterial and venous waveforms bilaterally.  IMPRESSION: 1. Diffuse skin and subcutaneous edema of the scrotum. No focal fluid collection. 2. Normal appearance of both testis.  Normal blood flow.   Electronically Signed   By: Rubye Oaks M.D.   On: 11/27/2014 03:01   [place] Art/ven Flow Abd Pelv Doppler  11/27/2014   CLINICAL DATA:  Groin swelling. Fall yesterday, now with testicular pain and swelling.  EXAM: SCROTAL ULTRASOUND  DOPPLER ULTRASOUND OF THE TESTICLES  TECHNIQUE: Complete ultrasound examination of the testicles, epididymis, and other scrotal structures was performed. Color and spectral Doppler ultrasound were also utilized to evaluate blood flow to the testicles.  COMPARISON:  None.  FINDINGS: Right testicle  Measurements: 3.5 x 2.1 x 2.1 cm. No mass or microlithiasis visualized. No testicular hematoma. Homogeneous blood flow noted.  Left testicle  Measurements: 3.8 x 1.9 x 2.4 cm. No mass or microlithiasis visualized. No testicular hematoma. Homogeneous blood flow noted.  Right  epididymis:  Normal where visualized, tail obscured.  Left epididymis:  Not well seen.  Hydrocele:  None visualized.  Varicocele:  None visualized.  Scrotal skin: Diffuse skin and subcutaneous edema is seen. No fluid collection. There is fat within both inguinal canals.  Pulsed Doppler interrogation of both testes demonstrates normal low resistance arterial and venous waveforms bilaterally.  IMPRESSION: 1. Diffuse skin and subcutaneous edema of the scrotum. No focal fluid collection. 2. Normal appearance of both testis.  Normal blood flow.   Electronically Signed   By: Rubye Oaks M.D.   On: 11/27/2014 03:01   Dg Knee Complete 4 Views Left  11/27/2014   CLINICAL DATA:  Status post fall, with left knee pain. Initial encounter.  EXAM: LEFT KNEE - COMPLETE 4+ VIEW  COMPARISON:  None.  FINDINGS: There is no evidence of fracture or dislocation. The joint spaces are preserved. Chondrocalcinosis is noted; the patellofemoral joint is grossly unremarkable in appearance. A fabella is seen.  No significant joint effusion is seen. The visualized soft tissues are normal in appearance.  IMPRESSION: 1. No evidence of fracture or dislocation. 2. Chondrocalcinosis noted.   Electronically Signed   By: Leotis Shames  Chang M.D.   On: 11/27/2014 03:05   Dg Knee Complete 4 Views Right  11/27/2014   CLINICAL DATA:  Status post fall; right knee pain. Initial encounter.  EXAM: RIGHT KNEE - COMPLETE 4+ VIEW  COMPARISON:  None.  FINDINGS: There is no evidence of fracture or dislocation. The joint spaces are preserved. There is narrowing of the patellofemoral compartment, with marginal osteophytes seen arising at all three compartments. Wall osteophytes are also noted. Underlying mild chondrocalcinosis seen. A fabella is noted.  No significant joint effusion is seen. Mild suprapatellar soft tissue swelling is suggested.  IMPRESSION: 1. No evidence of fracture or dislocation. 2. Mild tricompartmental osteoarthritis, most prominent at the  patellofemoral compartment. 3. Underlying mild chondrocalcinosis noted.   Electronically Signed   By: Roanna Raider M.D.   On: 11/27/2014 03:07     Assessment and Plan  1. Acute hypoxic respiratory failure likely due to combination of PAF, OHS, OSA, deconditioning: O2 sat 79% RA with ambulation on admission with tachypnea, hypoxia, and tachycardic. Improved to 91% with PT yesterday.   2. Acute on chronic diastolic CHF: Echo 12/05/14: mod LVH, EF 50-55%. Dry weight unclear as he was 396 at DC in 09/2014. Suspect he's had some degree of actual body weight loss since that time, which was not intentional per patient. This will need to be monitored if he has further unintentional weight loss. (?r/t DM or malnutrition in obesity). The patient told DM coordinator he drinks a significant amount of juice daily. He's been counseled against this. Has diuresed well. Cr 1.4->1.74 today and BP running softer. He got his AM IV Lasix already. Will d/c further doses and likely resume oral form tomorrow. Continue KCl today then hold pending resumption of diuretic. Hold ACEI today.   3. AKI on CKD stage III: see above regarding med changes. F/u BMET in AM.  4. PAF: He was previously in SR on 09/24/14 following cardioversion, back in AF RVR at time of first ED visit 11/27/14. HR is generally controlled when resting. Doubtful that he will maintain NSR without AAD given comorbidities. Amiodarone added yesterday - he was on this in the past but he self d/c'd it. The plan is to load with amio and eventual re-attempt at DCCV. Given bradycardia overnight and soft BP, will continue present regimen. HR may be up this AM because he is up and moving around with breakfast. Will ask nurse to give scheduled amio/BB now.  5. CAD s/p PCI to RCA in 2012, prior cath in 01/2014 with elevated L/RH pressures, mild-mod CAD of LAD/LCx with patent RCA - no angina. Neg troponins.  6. DM: labile CBG recently with hypoglycemia when seen in ER the  other day. Per IM.  7. Dental pain: per IM. Per telephone note 11/14/14 it says oral surgery confirmed the patient did not need to hold Coumadin for extraction and that INR needs to be less than 3.5. Patient still notes significant ongoing pain. ?Dental consult.  8. Leukocytosis: ? Source. He's had a smoldering leukocytosis recently. May be related to dental issues.  Appreciate IM input regarding this.  9. Microcytic anemia: appears at baseline, add hemoccult.  Signed, Ronie Spies PA-C Pager: [phone]   As above, patient seen and examined. Patient states his dyspnea has improved. No chest pain. His recurrent atrial fibrillation has most likely contributed to his dyspnea/volume excess. Amiodarone has been initiated. His INR has been therapeutic for greater than 4 weeks. We will try and arrange cardioversion tomorrow. If he holds  sinus he could be discharged over the weekend or Monday. Hopefully amiodarone will help maintain sinus rhythm. Will hold diaphoretic today given increase creatinine. Repeat potassium and renal function tomorrow. Olga Millers Addendum-cardioversion cannot be performed over the weekend. If stable patient can be discharged tomorrow morning and continue amiodarone load. We will plan cardioversion as an outpatient. Olga Millers

## 2014-12-06 NOTE — Progress Notes (Signed)
Spoke with pharmacist on the floor.  Recommend changing time for Lantus dosage to daily since patient did not receive Lantus last night. Pharmacist to talk with staff RN who has patient. Will continue to follow while in hospital. Smith Mince RN BSN CDE

## 2014-12-06 NOTE — Procedures (Signed)
Discussed CPAP with pt and he informed me that he sometimes wears one at home but does not want to wear one at the hospital.

## 2014-12-06 NOTE — Progress Notes (Signed)
[name] RFF:[id number] DOB: 04-Feb-1951 DOA: 12/04/2014 PCP: [name], MD  Brief narrative: 64 y/o ? P afib chad=3 s/p prior CV in 2012 then DCCV 09/23/14, diastolic CHF c Echo EF55%, cellulitis, OSA not on CPAP 2/2 to intolerance, DM ty 2, HLd, microcytic anemia vs AoCD?, CKD III, prior pilonidal cyst excision 12/2012 Seen 5/18 in ED c scrotal swelling discomfort, mild doe-told to ? lasix to 120 bid.  NON COMPLIANT with those recs. Sent over from Cardiology office and aggressively diuresed Noted difficulties managing his diabetes in hospital 2/2 to much ? amount of food taken in and his use of U500 insulin  Past medical history-As per Problem list Chart reviewed as below-   Consultants:  Cardiology  Procedures:  None   Antibiotics:  none   Subjective   Breathing better No sob Swelling in scrotum and in LE's improved   Objective    Interim History:   Telemetry: afib   Objective: Filed Vitals:   12/05/14 2100 12/06/14 0517 12/06/14 1108 12/06/14 1417  BP: 145/119 102/43 123/57 109/49  Pulse: 99 140 99 81  Temp: 97.5 F (36.4 C) 98.1 F (36.7 C)  98.2 F (36.8 C)  TempSrc: Oral Oral  Oral  Resp:  18  18  Height:      Weight:  169.6 kg (373 lb 14.4 oz)    SpO2: 96% 90%  98%    Intake/Output Summary (Last 24 hours) at 12/06/14 1528 Last data filed at 12/06/14 1447  Gross per 24 hour  Intake   1882 ml  Output   2575 ml  Net   -693 ml    Exam:  General: Morbid obesity, Body mass index is 50.7 kg/(m^2). Cardiovascular: s1 s 2 no m/r/g-cannot assess JVD given habitus Respiratory:  Clear no added sound Abdomen: obese Skin grade 2 le edema   Data Reviewed: Basic Metabolic Panel:  Recent Labs Lab 12/04/14 1756 12/05/14 0430 12/06/14 0517  NA 139 136 134*  K 4.0 3.9 3.7  CL 101 97* 93*  CO2 26 28 29   GLUCOSE 220* 406* 197*  BUN 14 14 20   CREATININE 1.33* 1.41* 1.74*  CALCIUM 8.9 8.7* 8.5*   Liver Function Tests:  Recent  Labs Lab 12/05/14 0430  AST 20  ALT 15*  ALKPHOS 63  BILITOT 0.7  PROT 7.5  ALBUMIN 3.4*   No results for input(s): LIPASE, AMYLASE in the last 168 hours. No results for input(s): AMMONIA in the last 168 hours. CBC:  Recent Labs Lab 12/04/14 1756 12/05/14 0430 12/06/14 0517  WBC 14.1* 11.8* 13.6*  NEUTROABS 10.2*  --   --   HGB 11.6* 11.2* 11.0*  HCT 36.9* 36.4* 35.4*  MCV 74.2* 74.7* 73.8*  PLT 341 338 346   Cardiac Enzymes:  Recent Labs Lab 12/05/14 12/05/14 0430 12/05/14 1031  TROPONINI <0.03 0.03 <0.03   BNP: Invalid input(s): POCBNP CBG:  Recent Labs Lab 12/05/14 1841 12/05/14 2058 12/05/14 2300 12/06/14 0527 12/06/14 1143  GLUCAP 141* 56* 99 168* 412*    No results found for this or any previous visit (from the past 240 hour(s)).   Studies:              All Imaging reviewed and is as per above notation   Scheduled Meds: . amiodarone  400 mg Oral BID  . ferrous sulfate  325 mg Oral Daily  . gabapentin  1,200 mg Oral BID  . insulin aspart  0-20 Units Subcutaneous TID WC  . insulin  aspart  6 Units Subcutaneous TID WC  . insulin glargine  60 Units Subcutaneous QHS  . metoprolol tartrate  25 mg Oral BID  . potassium chloride SA  20 mEq Oral BID  . rosuvastatin  40 mg Oral Daily  . sodium chloride  3 mL Intravenous Q12H  . sodium chloride  3 mL Intravenous Q12H  . spironolactone  25 mg Oral Daily  . warfarin  5 mg Oral ONCE-1800  . Warfarin - Pharmacist Dosing Inpatient   Does not apply q1800   Continuous Infusions:    Assessment/Plan: 1. AECHF/P.AFib--amio load in process.  Am mag/k check.  Diurese adjustment ongoing as per cards-cardiovert as per cards 2. DM ty 2 x 27 yrs-long discussion today c patient and endocrinologist Dr. [name] 5/27.  U500 dose to 50 tid.  Ensure he has 10pm snack monitoring with hypoglycenmic protocl.  continue Gabapentin for neuropathy 3. OSA-his Sleep Md is an [place] MD-hasn't seen this Md in a "while"-he has a  machine but is non-compliant.  Will need OP follow up 4. Morbid obesity, Body mass index is 50.7 kg/(m^2).-life threatening 5. Dental issues + Leukocytosis-supposed to have all teeth extracted 5/26.  will need to wait till OP.  Start Augmentin XR 875 until can be re-visited by dentist 6. HLD-continue Crestor 40 daily  Code Status: full Family Communication:  None bedsdie Disposition Plan: inpatient-? D/c 5/27   Pleas Koch, MD  [name] Pager [phone] 12/06/2014, 3:28 PM    LOS: 1 day

## 2014-12-07 LAB — BASIC METABOLIC PANEL
Anion gap: 14 (ref 5–15)
BUN: 24 mg/dL — AB (ref 6–20)
CALCIUM: 8.4 mg/dL — AB (ref 8.9–10.3)
CO2: 24 mmol/L (ref 22–32)
CREATININE: 1.73 mg/dL — AB (ref 0.61–1.24)
Chloride: 91 mmol/L — ABNORMAL LOW (ref 101–111)
GFR calc Af Amer: 47 mL/min — ABNORMAL LOW (ref >60)
GFR calc non Af Amer: 40 mL/min — ABNORMAL LOW (ref >60)
GLUCOSE: 427 mg/dL — AB (ref 65–99)
Potassium: 4 mmol/L (ref 3.5–5.1)
Sodium: 129 mmol/L — ABNORMAL LOW (ref 135–145)

## 2014-12-07 LAB — CBC
HEMATOCRIT: 36.8 % — AB (ref 39.0–52.0)
Hemoglobin: 11.5 g/dL — ABNORMAL LOW (ref 13.0–17.0)
MCH: 22.9 pg — ABNORMAL LOW (ref 26.0–34.0)
MCHC: 31.3 g/dL (ref 30.0–36.0)
MCV: 73.2 fL — ABNORMAL LOW (ref 78.0–100.0)
PLATELETS: 329 10*3/uL (ref 150–400)
RBC: 5.03 MIL/uL (ref 4.22–5.81)
RDW: 17.8 % — AB (ref 11.5–15.5)
WBC: 12.2 10*3/uL — ABNORMAL HIGH (ref 4.0–10.5)

## 2014-12-07 LAB — GLUCOSE, CAPILLARY
GLUCOSE-CAPILLARY: 260 mg/dL — AB (ref 65–99)
Glucose-Capillary: 170 mg/dL — ABNORMAL HIGH (ref 65–99)
Glucose-Capillary: 441 mg/dL — ABNORMAL HIGH (ref 65–99)
Glucose-Capillary: 486 mg/dL — ABNORMAL HIGH (ref 65–99)
Glucose-Capillary: 547 mg/dL — ABNORMAL HIGH (ref 65–99)
Glucose-Capillary: 87 mg/dL (ref 65–99)

## 2014-12-07 LAB — PROTIME-INR
INR: 2.32 — AB (ref 0.00–1.49)
PROTHROMBIN TIME: 25.2 s — AB (ref 11.6–15.2)

## 2014-12-07 MED ORDER — WARFARIN SODIUM 5 MG PO TABS
5.0000 mg | ORAL_TABLET | Freq: Once | ORAL | Status: AC
  Administered 2014-12-07: 5 mg via ORAL
  Filled 2014-12-07: qty 1

## 2014-12-07 MED ORDER — INSULIN REGULAR HUMAN (CONC) 500 UNIT/ML ~~LOC~~ SOLN
120.0000 [IU] | Freq: Three times a day (TID) | SUBCUTANEOUS | Status: AC
  Administered 2014-12-07: 120 [IU] via SUBCUTANEOUS
  Filled 2014-12-07 (×2): qty 20

## 2014-12-07 MED ORDER — INSULIN REGULAR HUMAN (CONC) 500 UNIT/ML ~~LOC~~ SOLN: 100.0000 [IU] | Freq: Three times a day (TID) | SUBCUTANEOUS | Status: DC

## 2014-12-07 MED ORDER — OXYCODONE HCL 5 MG PO TABS: 30.0000 mg | ORAL_TABLET | Freq: Four times a day (QID) | ORAL | Status: DC

## 2014-12-07 MED ORDER — INSULIN REGULAR HUMAN (CONC) 500 UNIT/ML ~~LOC~~ SOLN
20.0000 [IU] | SUBCUTANEOUS | Status: AC
  Administered 2014-12-07: 20 [IU] via SUBCUTANEOUS
  Filled 2014-12-07: qty 20

## 2014-12-07 MED ORDER — INSULIN REGULAR HUMAN (CONC) 500 UNIT/ML ~~LOC~~ SOLN
100.0000 [IU] | Freq: Three times a day (TID) | SUBCUTANEOUS | Status: DC
  Administered 2014-12-07: 100 [IU] via SUBCUTANEOUS
  Filled 2014-12-07: qty 20

## 2014-12-07 MED ORDER — OXYCODONE HCL 5 MG PO TABS
30.0000 mg | ORAL_TABLET | Freq: Four times a day (QID) | ORAL | Status: DC
  Administered 2014-12-07 – 2014-12-10 (×13): 30 mg via ORAL
  Filled 2014-12-07 (×13): qty 6

## 2014-12-07 MED ORDER — FUROSEMIDE 80 MG PO TABS
80.0000 mg | ORAL_TABLET | Freq: Two times a day (BID) | ORAL | Status: DC
  Administered 2014-12-07 – 2014-12-11 (×9): 80 mg via ORAL
  Filled 2014-12-07 (×11): qty 1

## 2014-12-07 NOTE — Progress Notes (Signed)
PT Cancellation Note  Patient Details Name: [name] MRN: [id number] DOB: 1951/02/09   Cancelled Treatment:    Reason Eval/Treat Not Completed: Patient declined, no reason specified.  Patient declined x2 - eating breakfast and later upset about insulin.  RN notified.  Will return at a later time for PT session.   Vena Austria 12/07/2014, 9:11 AM Durenda Hurt. Renaldo Fiddler, [facility] Acute Rehab Services Pager [phone]

## 2014-12-07 NOTE — Progress Notes (Signed)
Unfortunately, cardioversion cannot be scheduled over the weekend. The patient is aware. Plans will be for the patient to be discharged home when he is completely stable from the medical viewpoint. We will arrange for outpatient follow-up with cardioversion.  Jerral Bonito, MD

## 2014-12-07 NOTE — Progress Notes (Signed)
Patient alert oriented, chronic back and leg pain 8/10. No shortness of breath. Will continue to monitor patient.

## 2014-12-07 NOTE — Progress Notes (Signed)
ANTICOAGULATION CONSULT NOTE  Pharmacy Consult for warfarin Indication: atrial fibrillation  No Known Allergies  Patient Measurements: Height: 6' (182.9 cm) Weight: (!) 376 lb 8 oz (170.779 kg) IBW/kg (Calculated) : 77.6  Labs:  Recent Labs  12/05/14 12/05/14 0430 12/05/14 1031 12/06/14 0517 12/07/14 0345  HGB  --  11.2*  --  11.0* 11.5*  HCT  --  36.4*  --  35.4* 36.8*  PLT  --  338  --  346 329  LABPROT  --  27.9*  --  26.5* 25.2*  INR  --  2.66*  --  2.47* 2.32*  CREATININE  --  1.41*  --  1.74* 1.73*  TROPONINI <0.03 0.03 <0.03  --   --     Estimated Creatinine Clearance: 71 mL/min (by C-G formula based on Cr of 1.73).   Assessment: 49 yom presented to the hospital on 12/04/2014 with dyspnea. He is on warfarin for hx of afib. INR was elevated at coumadin clinic on admission (may be lab error since INR trended down significantly the next day). INR now remains therapeutic at 2.32 but has trended down slowly since dose held on 5/25. CBC remains low but stable with no reported significant s/s bleeding.   Of note, patient initiated on amiodarone during this admission which will eventually have an effect on increasing warfarin sensitivity so the dose may need to be adjusted down at some point.  Home dose = 5 mg daily with 7.5 mg Tuesday and Thursday  Goal of Therapy:  INR 2-3 Monitor platelets by anticoagulation protocol: Yes   Plan:  Coumadin 5 mg po x 1 tonight Daily INR Monitor CBC and for any s/s bleeding  Jeani Fassnacht K. Bonnye Fava, PharmD, BCPS Clinical Pharmacist - Resident Pager: [phone] Pharmacy: [phone] 12/07/2014 12:46 PM

## 2014-12-07 NOTE — Progress Notes (Signed)
[name] JJK:[id number] DOB: Jun 05, 1951 DOA: 12/04/2014 PCP: [name], MD  Brief narrative: 64 y/o ? P afib chad=3 s/p prior CV in 2012 then DCCV 09/23/14, diastolic CHF c Echo EF55%, cellulitis, OSA not on CPAP 2/2 to intolerance, DM ty 2, HLd, microcytic anemia vs AoCD?, CKD III, prior pilonidal cyst excision 12/2012 Seen 5/18 in ED c scrotal swelling discomfort, mild doe-told to ? lasix to 120 bid.  NON COMPLIANT with those recs. Sent over from Cardiology office and aggressively diuresed Noted difficulties managing his diabetes in hospital 2/2 to much ? amount of food taken in and his use of U500 insulin  Past medical history-As per Problem list Chart reviewed as below-   Consultants:  Cardiology  Procedures:  None   Antibiotics:  none   Subjective   Overall better in terms of breathing and swelling Sugars still ? ?  No symptoms hypoglycemia however   Objective    Interim History:   Telemetry: afib-rates low 100's   Objective: Filed Vitals:   12/06/14 1417 12/06/14 2006 12/07/14 0447 12/07/14 1001  BP: 109/49 111/52 145/66 122/67  Pulse: 81 87 69 98  Temp: 98.2 F (36.8 C) 99.4 F (37.4 C) 97.7 F (36.5 C)   TempSrc: Oral Axillary Oral   Resp: 18 18 19    Height:      Weight:   170.779 kg (376 lb 8 oz)   SpO2: 98% 98% 96%     Intake/Output Summary (Last 24 hours) at 12/07/14 1212 Last data filed at 12/07/14 1100  Gross per 24 hour  Intake   2163 ml  Output   3925 ml  Net  -1762 ml    Exam:  General: Morbid obesity, Body mass index is 51.05 kg/(m^2). Cardiovascular: s1 s 2 no m/r/g-cannot assess JVD given habitus Respiratory:  Clear no added sound Abdomen: obese Skin grade 2 le edema--skin somewhat wrinkled.  Scrotum non-enlarged   Data Reviewed: Basic Metabolic Panel:  Recent Labs Lab 12/04/14 1756 12/05/14 0430 12/06/14 0517 12/07/14 0345  NA 139 136 134* 129*  K 4.0 3.9 3.7 4.0  CL 101 97* 93* 91*  CO2 26 28 29 24     GLUCOSE 220* 406* 197* 427*  BUN 14 14 20  24*  CREATININE 1.33* 1.41* 1.74* 1.73*  CALCIUM 8.9 8.7* 8.5* 8.4*   Liver Function Tests:  Recent Labs Lab 12/05/14 0430  AST 20  ALT 15*  ALKPHOS 63  BILITOT 0.7  PROT 7.5  ALBUMIN 3.4*   No results for input(s): LIPASE, AMYLASE in the last 168 hours. No results for input(s): AMMONIA in the last 168 hours. CBC:  Recent Labs Lab 12/04/14 1756 12/05/14 0430 12/06/14 0517 12/07/14 0345  WBC 14.1* 11.8* 13.6* 12.2*  NEUTROABS 10.2*  --   --   --   HGB 11.6* 11.2* 11.0* 11.5*  HCT 36.9* 36.4* 35.4* 36.8*  MCV 74.2* 74.7* 73.8* 73.2*  PLT 341 338 346 329   Cardiac Enzymes:  Recent Labs Lab 12/05/14 12/05/14 0430 12/05/14 1031  TROPONINI <0.03 0.03 <0.03   BNP: Invalid input(s): POCBNP CBG:  Recent Labs Lab 12/06/14 1143 12/06/14 1630 12/06/14 2137 12/07/14 0542 12/07/14 1122  GLUCAP 412* 433* 407* 441* 547*    No results found for this or any previous visit (from the past 240 hour(s)).   Studies:              All Imaging reviewed and is as per above notation   Scheduled Meds: . amiodarone  400  mg Oral BID  . ferrous sulfate  325 mg Oral Daily  . gabapentin  1,200 mg Oral BID  . insulin regular human CONCENTRATED  120 Units Subcutaneous TID WC  . metoprolol tartrate  25 mg Oral BID  . oxycodone  30 mg Oral 4 times per day  . rosuvastatin  40 mg Oral Daily  . sodium chloride  3 mL Intravenous Q12H  . sodium chloride  3 mL Intravenous Q12H  . spironolactone  25 mg Oral Daily  . Warfarin - Pharmacist Dosing Inpatient   Does not apply q1800   Continuous Infusions:    Assessment/Plan:  1. AECHF//P.AFib--amio load in process.  Am mag/k check.  Diurese adjustment ongoing as per cards-cardiovert as OP when can adjust schedule.  continue Metoprolol 25 bid, Amiodarone 400 bid, Aldactone 25 od--lasix dosing to be adjusted per Dr. Myrtis Ser prior to him d/c home-appreciate assistance 2. DM ty 2 x 27 yrs-long  discussion today c patient and endocrinologist Dr. [name] 5/27.   U 500 dose to 50 tid-->120 which is usual home dose.  continue Gabapentin for neuropathy 3. OSA-his Sleep Md is an Rosanne Ashing Osbourne-hasn't seen this Md in a "while"-he has a machine but is non-compliant.  Will need OP follow up. 4. Knee pain-oxycodone re-ordered 5. Morbid obesity, Body mass index is 51.05 kg/(m^2).-life threatening. 6. Dental issues + Leukocytosis-supposed to have all teeth extracted 5/26 at [facility] dental.  will need to wait till OP.  Start Augmentin XR 875 until can be re-visited by dentist 7. HLD-continue Crestor 40 daily  Code Status: full Family Communication:  None bedsdie Disposition Plan: inpatient-? D/c 5/27   Pleas Koch, MD  [name] Pager [phone] 12/07/2014, 12:12 PM    LOS: 2 days

## 2014-12-08 DIAGNOSIS — Z79899 Other long term (current) drug therapy: Secondary | ICD-10-CM

## 2014-12-08 DIAGNOSIS — I5033 Acute on chronic diastolic (congestive) heart failure: Principal | ICD-10-CM

## 2014-12-08 LAB — GLUCOSE, CAPILLARY
GLUCOSE-CAPILLARY: 103 mg/dL — AB (ref 65–99)
GLUCOSE-CAPILLARY: 94 mg/dL (ref 65–99)
Glucose-Capillary: 315 mg/dL — ABNORMAL HIGH (ref 65–99)
Glucose-Capillary: 334 mg/dL — ABNORMAL HIGH (ref 65–99)
Glucose-Capillary: 462 mg/dL — ABNORMAL HIGH (ref 65–99)
Glucose-Capillary: 518 mg/dL — ABNORMAL HIGH (ref 65–99)
Glucose-Capillary: 91 mg/dL (ref 65–99)

## 2014-12-08 LAB — MAGNESIUM: Magnesium: 2.2 mg/dL (ref 1.7–2.4)

## 2014-12-08 LAB — CBC
HCT: 37.8 % — ABNORMAL LOW (ref 39.0–52.0)
HEMOGLOBIN: 12 g/dL — AB (ref 13.0–17.0)
MCH: 23.1 pg — ABNORMAL LOW (ref 26.0–34.0)
MCHC: 31.7 g/dL (ref 30.0–36.0)
MCV: 72.8 fL — ABNORMAL LOW (ref 78.0–100.0)
PLATELETS: 344 10*3/uL (ref 150–400)
RBC: 5.19 MIL/uL (ref 4.22–5.81)
RDW: 17.4 % — AB (ref 11.5–15.5)
WBC: 13.9 10*3/uL — AB (ref 4.0–10.5)

## 2014-12-08 LAB — BASIC METABOLIC PANEL
Anion gap: 11 (ref 5–15)
BUN: 22 mg/dL — AB (ref 6–20)
CHLORIDE: 98 mmol/L — AB (ref 101–111)
CO2: 26 mmol/L (ref 22–32)
Calcium: 9.1 mg/dL (ref 8.9–10.3)
Creatinine, Ser: 1.48 mg/dL — ABNORMAL HIGH (ref 0.61–1.24)
GFR calc non Af Amer: 49 mL/min — ABNORMAL LOW (ref >60)
GFR, EST AFRICAN AMERICAN: 56 mL/min — AB (ref >60)
Glucose, Bld: 104 mg/dL — ABNORMAL HIGH (ref 65–99)
Potassium: 4.2 mmol/L (ref 3.5–5.1)
SODIUM: 135 mmol/L (ref 135–145)

## 2014-12-08 LAB — OCCULT BLOOD X 1 CARD TO LAB, STOOL: Fecal Occult Bld: NEGATIVE

## 2014-12-08 LAB — PROTIME-INR
INR: 2.3 — ABNORMAL HIGH (ref 0.00–1.49)
Prothrombin Time: 25 seconds — ABNORMAL HIGH (ref 11.6–15.2)

## 2014-12-08 MED ORDER — INSULIN REGULAR HUMAN (CONC) 500 UNIT/ML ~~LOC~~ SOLN
100.0000 [IU] | Freq: Two times a day (BID) | SUBCUTANEOUS | Status: AC
  Administered 2014-12-08 – 2014-12-09 (×2): 100 [IU] via SUBCUTANEOUS
  Filled 2014-12-08 (×2): qty 20

## 2014-12-08 MED ORDER — WARFARIN SODIUM 5 MG PO TABS
5.0000 mg | ORAL_TABLET | Freq: Once | ORAL | Status: AC
  Administered 2014-12-08: 5 mg via ORAL
  Filled 2014-12-08: qty 1

## 2014-12-08 NOTE — Plan of Care (Signed)
Problem: Phase I Progression Outcomes Goal: EF % per last Echo/documented,Core Reminder form on chart Outcome: Completed/Met Date Met:  12/08/14 EF 50-55%(12-05-14)

## 2014-12-08 NOTE — Progress Notes (Signed)
Spoke with patient about wearing CPAP and he is still refusing. RT will continue to monitor.

## 2014-12-08 NOTE — Progress Notes (Signed)
SUBJECTIVE: I noted in the chart yesterday that it might be okay for him to go home with follow-up outpatient cardioversion. As I see him this morning I have changed my mind. He had marked diuresis yesterday with oral diarrhetic's. He has continued edema. I feel will be best to continue his diuresis in the hospital and proceed with cardioversion while he is here.   Filed Vitals:   12/07/14 2035 12/07/14 2334 12/08/14 0532 12/08/14 0541  BP: 141/56 111/66 91/38 127/75  Pulse: 81 69 70   Temp: 97.9 F (36.6 C)  98.5 F (36.9 C)   TempSrc: Oral  Oral   Resp: 18  18   Height:      Weight:   368 lb 4.8 oz (167.06 kg)   SpO2: 99%  95%      Intake/Output Summary (Last 24 hours) at 12/08/14 0748 Last data filed at 12/08/14 0600  Gross per 24 hour  Intake   1989 ml  Output   4601 ml  Net  -2612 ml    LABS: Basic Metabolic Panel:  Recent Labs  67/12/45 0345 12/08/14 0607  NA 129* 135  K 4.0 4.2  CL 91* 98*  CO2 24 26  GLUCOSE 427* 104*  BUN 24* 22*  CREATININE 1.73* 1.48*  CALCIUM 8.4* 9.1  MG  --  2.2   Liver Function Tests: No results for input(s): AST, ALT, ALKPHOS, BILITOT, PROT, ALBUMIN in the last 72 hours. No results for input(s): LIPASE, AMYLASE in the last 72 hours. CBC:  Recent Labs  12/07/14 0345 12/08/14 0607  WBC 12.2* 13.9*  HGB 11.5* 12.0*  HCT 36.8* 37.8*  MCV 73.2* 72.8*  PLT 329 344   Cardiac Enzymes:  Recent Labs  12/05/14 1031  TROPONINI <0.03   BNP: Invalid input(s): POCBNP D-Dimer: No results for input(s): DDIMER in the last 72 hours. Hemoglobin A1C: No results for input(s): HGBA1C in the last 72 hours. Fasting Lipid Panel: No results for input(s): CHOL, HDL, LDLCALC, TRIG, CHOLHDL, LDLDIRECT in the last 72 hours. Thyroid Function Tests: No results for input(s): TSH, T4TOTAL, T3FREE, THYROIDAB in the last 72 hours.  Invalid input(s): FREET3  RADIOLOGY: Dg Chest 2 View  12/04/2014   CLINICAL DATA:  Chronic shortness of  breath with exertion, acute onset of cough approximately 2 days ago. Prior coronary artery stenting in 2010. Current history of hypertension, diabetes and atrial fibrillation.  EXAM: CHEST  2 VIEW  COMPARISON:  09/20/2014 and earlier.  FINDINGS: AP erect and lateral imaging was performed. Cardiac silhouette markedly enlarged but stable. Thoracic aorta atherosclerotic, unchanged. Hilar and mediastinal contours otherwise unremarkable. Mild pulmonary venous hypertension, unchanged, without evidence of pulmonary edema. Lungs clear. No pleural effusions. Degenerative changes involving the thoracic spine. No significant interval change dating back to July, 2013.  IMPRESSION: Stable cardiomegaly.  No acute cardiopulmonary disease.   Electronically Signed   By: Hulan Saas M.D.   On: 12/04/2014 19:24   US Scrotum  11/27/2014   CLINICAL DATA:  Groin swelling. Fall yesterday, now with testicular pain and swelling.  EXAM: SCROTAL ULTRASOUND  DOPPLER ULTRASOUND OF THE TESTICLES  TECHNIQUE: Complete ultrasound examination of the testicles, epididymis, and other scrotal structures was performed. Color and spectral Doppler ultrasound were also utilized to evaluate blood flow to the testicles.  COMPARISON:  None.  FINDINGS: Right testicle  Measurements: 3.5 x 2.1 x 2.1 cm. No mass or microlithiasis visualized. No testicular hematoma. Homogeneous blood flow noted.  Left testicle  Measurements:  3.8 x 1.9 x 2.4 cm. No mass or microlithiasis visualized. No testicular hematoma. Homogeneous blood flow noted.  Right epididymis:  Normal where visualized, tail obscured.  Left epididymis:  Not well seen.  Hydrocele:  None visualized.  Varicocele:  None visualized.  Scrotal skin: Diffuse skin and subcutaneous edema is seen. No fluid collection. There is fat within both inguinal canals.  Pulsed Doppler interrogation of both testes demonstrates normal low resistance arterial and venous waveforms bilaterally.  IMPRESSION: 1. Diffuse skin  and subcutaneous edema of the scrotum. No focal fluid collection. 2. Normal appearance of both testis.  Normal blood flow.   Electronically Signed   By: Rubye Oaks M.D.   On: 11/27/2014 03:01   [place] Art/ven Flow Abd Pelv Doppler  11/27/2014   CLINICAL DATA:  Groin swelling. Fall yesterday, now with testicular pain and swelling.  EXAM: SCROTAL ULTRASOUND  DOPPLER ULTRASOUND OF THE TESTICLES  TECHNIQUE: Complete ultrasound examination of the testicles, epididymis, and other scrotal structures was performed. Color and spectral Doppler ultrasound were also utilized to evaluate blood flow to the testicles.  COMPARISON:  None.  FINDINGS: Right testicle  Measurements: 3.5 x 2.1 x 2.1 cm. No mass or microlithiasis visualized. No testicular hematoma. Homogeneous blood flow noted.  Left testicle  Measurements: 3.8 x 1.9 x 2.4 cm. No mass or microlithiasis visualized. No testicular hematoma. Homogeneous blood flow noted.  Right epididymis:  Normal where visualized, tail obscured.  Left epididymis:  Not well seen.  Hydrocele:  None visualized.  Varicocele:  None visualized.  Scrotal skin: Diffuse skin and subcutaneous edema is seen. No fluid collection. There is fat within both inguinal canals.  Pulsed Doppler interrogation of both testes demonstrates normal low resistance arterial and venous waveforms bilaterally.  IMPRESSION: 1. Diffuse skin and subcutaneous edema of the scrotum. No focal fluid collection. 2. Normal appearance of both testis.  Normal blood flow.   Electronically Signed   By: Rubye Oaks M.D.   On: 11/27/2014 03:01   Dg Knee Complete 4 Views Left  11/27/2014   CLINICAL DATA:  Status post fall, with left knee pain. Initial encounter.  EXAM: LEFT KNEE - COMPLETE 4+ VIEW  COMPARISON:  None.  FINDINGS: There is no evidence of fracture or dislocation. The joint spaces are preserved. Chondrocalcinosis is noted; the patellofemoral joint is grossly unremarkable in appearance. A fabella is seen.  No  significant joint effusion is seen. The visualized soft tissues are normal in appearance.  IMPRESSION: 1. No evidence of fracture or dislocation. 2. Chondrocalcinosis noted.   Electronically Signed   By: Roanna Raider M.D.   On: 11/27/2014 03:05   Dg Knee Complete 4 Views Right  11/27/2014   CLINICAL DATA:  Status post fall; right knee pain. Initial encounter.  EXAM: RIGHT KNEE - COMPLETE 4+ VIEW  COMPARISON:  None.  FINDINGS: There is no evidence of fracture or dislocation. The joint spaces are preserved. There is narrowing of the patellofemoral compartment, with marginal osteophytes seen arising at all three compartments. Wall osteophytes are also noted. Underlying mild chondrocalcinosis seen. A fabella is noted.  No significant joint effusion is seen. Mild suprapatellar soft tissue swelling is suggested.  IMPRESSION: 1. No evidence of fracture or dislocation. 2. 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 3. Underlying mild chondrocalcinosis noted.   Electronically Signed   By: Roanna Raider M.D.   On: 11/27/2014 03:07    PHYSICAL EXAM  the patient is morbidly obese. He is oriented to person  time and place. Affect is normal. Lungs reveal scattered rhonchi. Cardiac exam reveals an S1 and S2. The abdomen is obese. The patient still has 2+ peripheral edema. He has chronic skin changes in his lower extremities from his edema.   TELEMETRY: I have reviewed telemetry today Dec 08, 2014. There is continued atrial fibrillation. The rate is controlled.   ASSESSMENT AND PLAN:    Diabetes mellitus type 2, uncontrolled, with complications      This is being managed completely by the internal medicine team.    Atrial fibrillation-    Atrial fibrillation continues. He is receiving oral amiodarone. Initially we had considered letting him go home with return for outpatient cardioversion. I'll change my mind and I feel he should be kept in the hospital for  cardioversion. I have made him nothing by mouth after been night. However we will have to finalize the cardioversion plans first thing tomorrow morning. I will look into this. Since it is a hospital holiday day, I'm not sure yet if this can be arranged for tomorrow.    Chronic anticoagulation- Coumadin     The patient has been appropriately anticoagulated.    CKD (chronic kidney disease) stage 3, GFR 30-59 ml/min      Renal function is being followed carefully.    Acute on chronic diastolic heart failure     Patient had substantial diuresis with oral diarrhetic's yesterday. He has continued significant volume overload. It will be most appropriate to keep him in the hospital for more complete diuresis. Hopefully, return to sinus rhythm will help even further.      On amiodarone therapy    He is receiving oral amiodarone load.   Willa Rough 12/08/2014 7:48 AM

## 2014-12-08 NOTE — Discharge Instructions (Signed)

## 2014-12-08 NOTE — Progress Notes (Signed)
[name]:[id number] DOB: [birth year] DOA: 12/04/2014 PCP: [name], MD  Brief narrative: 64 y/o ? P afib chad=3 s/p prior CV in 2012 then DCCV 09/23/14, diastolic CHF c Echo EF55%, cellulitis, OSA not on CPAP 2/2 to intolerance, DM ty 2, HLd, microcytic anemia vs AoCD?, CKD III, prior pilonidal cyst excision 12/2012 Seen 5/18 in ED c scrotal swelling discomfort, mild doe-told to ? lasix to 120 bid.  NON COMPLIANT with those recs. Sent over from Cardiology office and aggressively diuresed Noted difficulties managing his diabetes in hospital 2/2 to much ? amount of food taken in and his use of U500 insulin Cardiology deciding about DCCV in next cpl days  Past medical history-As per Problem list Chart reviewed as below-   Consultants:  Cardiology  Procedures:  None   Antibiotics:  none   Subjective   Overall better in terms of breathing and swelling scrotal pain better but swollen still Sugars still ? ?  No cp No awareness of fib   Objective    Interim History:   Telemetry: afib-rates low 100's   Objective: Filed Vitals:   12/08/14 0532 12/08/14 0541 12/08/14 1134 12/08/14 1345  BP: 91/38 127/75 138/80 141/68  Pulse: 70  63 92  Temp: 98.5 F (36.9 C)  97.4 F (36.3 C) 98.5 F (36.9 C)  TempSrc: Oral  Oral Oral  Resp: 18  18 18   Height:      Weight: 167.06 kg (368 lb 4.8 oz)     SpO2: 95%  96% 94%    Intake/Output Summary (Last 24 hours) at 12/08/14 1513 Last data filed at 12/08/14 1300  Gross per 24 hour  Intake   1662 ml  Output   4427 ml  Net  -2765 ml    Exam:  General: Morbid obesity, Body mass index is 49.94 kg/(m^2).pleasant Cardiovascular: s1 s 2 no m/r/g-cannot assess JVD Respiratory:  Clear no added sound Abdomen: obese Skin grade 2 le edema--skin somewhat wrinkled.  Scrotum non-enlarged   Data Reviewed: Basic Metabolic Panel:  Recent Labs Lab 12/04/14 1756 12/05/14 0430 12/06/14 0517 12/07/14 0345 12/08/14 0607    NA 139 136 134* 129* 135  K 4.0 3.9 3.7 4.0 4.2  CL 101 97* 93* 91* 98*  CO2 26 28 29 24 26   GLUCOSE 220* 406* 197* 427* 104*  BUN 14 14 20  24* 22*  CREATININE 1.33* 1.41* 1.74* 1.73* 1.48*  CALCIUM 8.9 8.7* 8.5* 8.4* 9.1  MG  --   --   --   --  2.2   Liver Function Tests:  Recent Labs Lab 12/05/14 0430  AST 20  ALT 15*  ALKPHOS 63  BILITOT 0.7  PROT 7.5  ALBUMIN 3.4*   No results for input(s): LIPASE, AMYLASE in the last 168 hours. No results for input(s): AMMONIA in the last 168 hours. CBC:  Recent Labs Lab 12/04/14 1756 12/05/14 0430 12/06/14 0517 12/07/14 0345 12/08/14 0607  WBC 14.1* 11.8* 13.6* 12.2* 13.9*  NEUTROABS 10.2*  --   --   --   --   HGB 11.6* 11.2* 11.0* 11.5* 12.0*  HCT 36.9* 36.4* 35.4* 36.8* 37.8*  MCV 74.2* 74.7* 73.8* 73.2* 72.8*  PLT 341 338 346 329 344   Cardiac Enzymes:  Recent Labs Lab 12/05/14 12/05/14 0430 12/05/14 1031  TROPONINI <0.03 0.03 <0.03   BNP: Invalid input(s): POCBNP CBG:  Recent Labs Lab 12/07/14 2213 12/07/14 2344 12/08/14 0233 12/08/14 0544 12/08/14 1103  GLUCAP 91 87 103* 94 315*  No results found for this or any previous visit (from the past 240 hour(s)).   Studies:              All Imaging reviewed and is as per above notation   Scheduled Meds: . amiodarone  400 mg Oral BID  . ferrous sulfate  325 mg Oral Daily  . furosemide  80 mg Oral BID  . gabapentin  1,200 mg Oral BID  . metoprolol tartrate  25 mg Oral BID  . oxyCODONE  30 mg Oral Q6H  . rosuvastatin  40 mg Oral Daily  . sodium chloride  3 mL Intravenous Q12H  . sodium chloride  3 mL Intravenous Q12H  . spironolactone  25 mg Oral Daily  . warfarin  5 mg Oral ONCE-1800  . Warfarin - Pharmacist Dosing Inpatient   Does not apply q1800   Continuous Infusions:    Assessment/Plan:  1. AECHF//P.AFib--amio load in process.  Am mag/k check.  Diurese adjustment ongoing as per cards-cardiovert asper Dr. Myrtis Ser  continue Metoprolol 25 bid,  Amiodarone 400 bid, Aldactone 25 od, lasix 80 o bid [home dose]-so far minus 7 liters, weight 377-->368 lbs 2. DM ty 2 x 27 yrs-long discussionendocrinologist Dr. [name] 5/27.   U 500 dose to 50 tid-->120-->100 bid breakfast and supper-nursing informed to call ME about acute changes of hypo/hyperglycemia   continue Gabapentin for neuropathy 3. OSA-his Sleep Md is an Rosanne Ashing Osbourne-hasn't seen this Md in a "while"-he has a machine but is non-compliant.  Will need OP follow up. 4. Knee pain-oxycodone re-ordered 5. Morbid obesity, Body mass index is 49.94 kg/(m^2).-life threatening. 6. Dental issues + Leukocytosis-supposed to have all teeth extracted 5/26 at [facility] dental.  will need to wait till OP.  Start Augmentin XR 875 until can be re-visited by dentist 7. HLD-continue Crestor 40 daily  Code Status: full Family Communication:  None bedsdie Disposition Plan: inpatient--per cardiology   Pleas Koch, MD  [name] Pager [phone] 12/08/2014, 3:13 PM    LOS: 3 days

## 2014-12-08 NOTE — Progress Notes (Signed)
ANTICOAGULATION CONSULT NOTE  Pharmacy Consult for warfarin Indication: atrial fibrillation  No Known Allergies  Patient Measurements: Height: 6' (182.9 cm) Weight: (!) 368 lb 4.8 oz (167.06 kg) IBW/kg (Calculated) : 77.6  Labs:  Recent Labs  12/06/14 0517 12/07/14 0345 12/08/14 0607  HGB 11.0* 11.5* 12.0*  HCT 35.4* 36.8* 37.8*  PLT 346 329 344  LABPROT 26.5* 25.2* 25.0*  INR 2.47* 2.32* 2.30*  CREATININE 1.74* 1.73* 1.48*    Estimated Creatinine Clearance: 81.9 mL/min (by C-G formula based on Cr of 1.48).   Assessment: 37 yom presented to the hospital on 12/04/2014 with dyspnea. He is on warfarin for hx of afib. INR was elevated at coumadin clinic on admission (may be lab error since INR trended down significantly the next day). INR now remains therapeutic at 2.30 (missed dose on 5/25 and lower dose on 5/26). CBC remains low but has improved with no reported significant s/s bleeding.   Of note, patient initiated on amiodarone during this admission which will eventually have an effect on increasing warfarin sensitivity so the dose may need to be adjusted down at some point.  Home dose = 5 mg daily with 7.5 mg Tuesday and Thursday  Goal of Therapy:  INR 2-3 Monitor platelets by anticoagulation protocol: Yes   Plan:  Coumadin 5 mg po x 1 tonight Daily INR Monitor CBC and for any s/s bleeding  Chanah Tidmore K. Bonnye Fava, PharmD, BCPS Clinical Pharmacist - Resident Pager: [phone] Pharmacy: [phone] 12/08/2014 10:59 AM

## 2014-12-09 LAB — BASIC METABOLIC PANEL
ANION GAP: 12 (ref 5–15)
BUN: 25 mg/dL — AB (ref 6–20)
CALCIUM: 9.2 mg/dL (ref 8.9–10.3)
CO2: 28 mmol/L (ref 22–32)
Chloride: 90 mmol/L — ABNORMAL LOW (ref 101–111)
Creatinine, Ser: 1.79 mg/dL — ABNORMAL HIGH (ref 0.61–1.24)
GFR calc non Af Amer: 39 mL/min — ABNORMAL LOW (ref >60)
GFR, EST AFRICAN AMERICAN: 45 mL/min — AB (ref >60)
Glucose, Bld: 259 mg/dL — ABNORMAL HIGH (ref 65–99)
Potassium: 4.4 mmol/L (ref 3.5–5.1)
Sodium: 130 mmol/L — ABNORMAL LOW (ref 135–145)

## 2014-12-09 LAB — CBC
HCT: 38.3 % — ABNORMAL LOW (ref 39.0–52.0)
Hemoglobin: 12.6 g/dL — ABNORMAL LOW (ref 13.0–17.0)
MCH: 23.7 pg — AB (ref 26.0–34.0)
MCHC: 32.9 g/dL (ref 30.0–36.0)
MCV: 72 fL — ABNORMAL LOW (ref 78.0–100.0)
Platelets: 362 10*3/uL (ref 150–400)
RBC: 5.32 MIL/uL (ref 4.22–5.81)
RDW: 17.2 % — ABNORMAL HIGH (ref 11.5–15.5)
WBC: 13.7 10*3/uL — AB (ref 4.0–10.5)

## 2014-12-09 LAB — GLUCOSE, CAPILLARY
GLUCOSE-CAPILLARY: 253 mg/dL — AB (ref 65–99)
GLUCOSE-CAPILLARY: 253 mg/dL — AB (ref 65–99)
GLUCOSE-CAPILLARY: 295 mg/dL — AB (ref 65–99)
GLUCOSE-CAPILLARY: 377 mg/dL — AB (ref 65–99)
Glucose-Capillary: 250 mg/dL — ABNORMAL HIGH (ref 65–99)
Glucose-Capillary: 333 mg/dL — ABNORMAL HIGH (ref 65–99)

## 2014-12-09 LAB — PROTIME-INR
INR: 2.12 — AB (ref 0.00–1.49)
PROTHROMBIN TIME: 23.6 s — AB (ref 11.6–15.2)

## 2014-12-09 MED ORDER — WARFARIN SODIUM 7.5 MG PO TABS
7.5000 mg | ORAL_TABLET | Freq: Once | ORAL | Status: AC
  Administered 2014-12-09: 7.5 mg via ORAL
  Filled 2014-12-09: qty 1

## 2014-12-09 NOTE — Progress Notes (Signed)
[name] CHY:[id number] DOB: [birth year] DOA: 12/04/2014 PCP: Paul Bussing, MD  Brief narrative: 64 y/o ? P afib chad=3 s/p prior CV in 2012 then DCCV 09/23/14, diastolic CHF c Echo EF55%, cellulitis, OSA not on CPAP 2/2 to intolerance, DM ty 2, HLd, microcytic anemia vs AoCD?, CKD III, prior pilonidal cyst excision 12/2012 Seen 5/18 in ED c scrotal swelling discomfort, mild doe-told to ? lasix to 120 bid.  NON COMPLIANT with those recs. Sent over from Cardiology office and aggressively diuresed--also noted to be back in atrial fibrillation with RVR which may have been contributing to further dyspnea on exertion The patient was seen by cardiology and loaded with amiodarone  in hopes of obtaining rhythm control.  because of the long weekend and it was decided initially to keep him until he diureses adequately and cardiovert him as an outpatient however it now appears patient will be cardioverted on 5/31 Noted difficulties managing his diabetes in hospital 2/2 to much ? amount of food taken in and his use of U500 insulin  Past medical history-As per Problem list Chart reviewed as below-   Consultants:  Cardiology  Procedures:  None   Antibiotics:  none   Subjective   Overall better in terms of breathing and swelling scrotal pain better but swollen still Sugars still ? ?  No cp No awareness of fib   Objective    Interim History:   Telemetry: afib-rates low 100's   Objective: Filed Vitals:   12/08/14 2056 12/08/14 2200 12/09/14 0052 12/09/14 0551  BP: 124/76  130/60 114/63  Pulse: 59 85 73 84  Temp: 97.7 F (36.5 C)   98.4 F (36.9 C)  TempSrc: Oral   Oral  Resp:    18  Height:      Weight:    167.649 kg (369 lb 9.6 oz)  SpO2: 95%   97%    Intake/Output Summary (Last 24 hours) at 12/09/14 0949 Last data filed at 12/09/14 0714  Gross per 24 hour  Intake   1524 ml  Output   6176 ml  Net  -4652 ml    Exam:  General: Morbid obesity, Body mass index  is 50.12 kg/(m^2).pleasant Cardiovascular: s1 s 2 no m/r/g-cannot assess JVD Respiratory:  Clear no added sound Abdomen: obese Skin grade 2 le edema--skin somewhat wrinkled.  Scrotum non-enlarged   Data Reviewed: Basic Metabolic Panel:  Recent Labs Lab 12/05/14 0430 12/06/14 0517 12/07/14 0345 12/08/14 0607 12/09/14 0500  NA 136 134* 129* 135 130*  K 3.9 3.7 4.0 4.2 4.4  CL 97* 93* 91* 98* 90*  CO2 28 29 24 26 28   GLUCOSE 406* 197* 427* 104* 259*  BUN 14 20 24* 22* 25*  CREATININE 1.41* 1.74* 1.73* 1.48* 1.79*  CALCIUM 8.7* 8.5* 8.4* 9.1 9.2  MG  --   --   --  2.2  --    Liver Function Tests:  Recent Labs Lab 12/05/14 0430  AST 20  ALT 15*  ALKPHOS 63  BILITOT 0.7  PROT 7.5  ALBUMIN 3.4*   No results for input(s): LIPASE, AMYLASE in the last 168 hours. No results for input(s): AMMONIA in the last 168 hours. CBC:  Recent Labs Lab 12/04/14 1756 12/05/14 0430 12/06/14 0517 12/07/14 0345 12/08/14 0607 12/09/14 0500  WBC 14.1* 11.8* 13.6* 12.2* 13.9* 13.7*  NEUTROABS 10.2*  --   --   --   --   --   HGB 11.6* 11.2* 11.0* 11.5* 12.0* 12.6*  HCT 36.9*  36.4* 35.4* 36.8* 37.8* 38.3*  MCV 74.2* 74.7* 73.8* 73.2* 72.8* 72.0*  PLT 341 338 346 329 344 362   Cardiac Enzymes:  Recent Labs Lab 12/05/14 12/05/14 0430 12/05/14 1031  TROPONINI <0.03 0.03 <0.03   BNP: Invalid input(s): POCBNP CBG:  Recent Labs Lab 12/08/14 2056 12/08/14 2332 12/09/14 0309 12/09/14 0547 12/09/14 0801  GLUCAP 518* 334* 253* 253* 250*    No results found for this or any previous visit (from the past 240 hour(s)).   Studies:              All Imaging reviewed and is as per above notation   Scheduled Meds: . amiodarone  400 mg Oral BID  . ferrous sulfate  325 mg Oral Daily  . furosemide  80 mg Oral BID  . gabapentin  1,200 mg Oral BID  . insulin regular human CONCENTRATED  100 Units Subcutaneous BID WC  . metoprolol tartrate  25 mg Oral BID  . oxyCODONE  30 mg Oral Q6H    . rosuvastatin  40 mg Oral Daily  . sodium chloride  3 mL Intravenous Q12H  . sodium chloride  3 mL Intravenous Q12H  . spironolactone  25 mg Oral Daily  . warfarin  7.5 mg Oral ONCE-1800  . Warfarin - Pharmacist Dosing Inpatient   Does not apply q1800   Continuous Infusions:    Assessment/Plan:  1. AECHF//P.AFib--amio load in process.    Diurese adjustment ongoing as per cards-cardiovert as per Dr. Myrtis [name]  continue Metoprolol 25 bid, Amiodarone 400 bid, Aldactone 25 od, lasix 80 po bid [home dose]-so far minus 10.5 liters, weight 377-->369 lbs 2. DM ty 2 x 27 yrs-long discussion endocrinologist Dr. Sharl [name] 5/27.   U 500 dose to 50 tid-->120-->100 bid breakfast and supper-nursing informed to call ME about acute changes of hypo/hyperglycemia. Sugars 200 rang which is much better for him.  misse dOP appt Dr. Sharl [name] Endocrinology-will need to re-schedule the same. Gabapentin for neuropathy 3. OSA-his Sleep Md is an [name]-hasn't seen this Md in a "while"-he has a machine but is non-compliant.  Will need OP follow up. 4. Knee pain-oxycodone re-ordered-has appt c Pain Md scheduled for 5/31 which he will miss-he should be provided with a 4 days supply of opiates on d/c home 5. Morbid obesity, Body mass index is 50.12 kg/(m^2).-life threatening. 6. Dental issues + Leukocytosis-supposed to have all teeth extracted 5/26 at [facility] dental.  will need to wait till OP.  Start Augmentin XR 875 until can be re-visited by dentist 7. HLD-continue Crestor 40 daily  Code Status: full Family Communication:  None bedsdie Disposition Plan: inpatient--per cardiology   Paul Koch, MD  [name] Pager [phone] 12/09/2014, 9:49 AM    LOS: 4 days

## 2014-12-09 NOTE — Progress Notes (Signed)
ANTICOAGULATION CONSULT NOTE  Pharmacy Consult for warfarin Indication: atrial fibrillation  No Known Allergies  Patient Measurements: Height: 6' (182.9 cm) Weight: (!) 369 lb 9.6 oz (167.649 kg) IBW/kg (Calculated) : 77.6  Labs:  Recent Labs  12/07/14 0345 12/08/14 0607 12/09/14 0500  HGB 11.5* 12.0* 12.6*  HCT 36.8* 37.8* 38.3*  PLT 329 344 362  LABPROT 25.2* 25.0* 23.6*  INR 2.32* 2.30* 2.12*  CREATININE 1.73* 1.48* 1.79*    Estimated Creatinine Clearance: 67.9 mL/min (by C-G formula based on Cr of 1.79).   Assessment: 62 yom presented to the hospital on 12/04/2014 with dyspnea. He is on warfarin for hx of afib. INR was elevated at coumadin clinic on admission (may be lab error since INR trended down significantly the next day).   INR now remains therapeutic at 2.12 (missed dose on 5/25 and lower dose on 5/26). CBC remains low but has improving with no bleeding noted.  Of note, patient initiated on amiodarone during this admission which will eventually have an effect on increasing warfarin sensitivity so the dose may need to be adjusted down at some point.  Home dose = 5 mg daily with 7.5 mg Tuesday and Thursday  Goal of Therapy:  INR 2-3 Monitor platelets by anticoagulation protocol: Yes   Plan:  Coumadin 7.5 mg po x 1 tonight Daily INR Monitor CBC and for any s/s bleeding  Sheppard Coil PharmD., BCPS Clinical Pharmacist Pager [phone] 12/09/2014 8:58 AM

## 2014-12-09 NOTE — Progress Notes (Signed)
Physical Therapy Discharge Patient Details Name: [name] MRN: [id number] DOB: [birth year] Today's Date: 12/09/2014 Time:  -     Patient discharged from PT services secondary to goals met and no further PT needs identified and medical decline - will need to re-order PT to resume therapy services.  Please see latest therapy progress note for current level of functioning and progress toward goals.    Progress and discharge plan discussed with patient and/or caregiver: Patient/Caregiver disagrees with plan   Patient observed up ad lib  Ambulating in halls.   GP     Marcelino Freestone PT [phone]  12/09/2014, 9:17 AM

## 2014-12-09 NOTE — Progress Notes (Signed)
Patient: [name] / Admit Date: 12/04/2014 / Date of Encounter: 12/09/2014, 7:57 AM   Subjective: No CP; mild DOE  Objective: Telemetry: Afib rate controlled Physical Exam: Blood pressure 114/63, pulse 84, temperature 98.4 F (36.9 C), temperature source Oral, resp. rate 18, height 6' (1.829 m), weight 369 lb 9.6 oz (167.649 kg), SpO2 97 %. General: Well developed morbidly obese WM, in no acute distress. Head: Normal Neck: Supple Lungs: CTA Heart: Irregular Abdomen: Soft, non-tender, non-distended Extremities: 1 edema with chronic LE venous stasis changes/chronic skin thickening and baseline large leg habitus. Neuro: Alert and oriented X 3. Moves all extremities spontaneously.   Intake/Output Summary (Last 24 hours) at 12/09/14 0757 Last data filed at 12/09/14 0714  Gross per 24 hour  Intake   2004 ml  Output   6176 ml  Net  -4172 ml    Inpatient Medications:  . amiodarone  400 mg Oral BID  . ferrous sulfate  325 mg Oral Daily  . furosemide  80 mg Oral BID  . gabapentin  1,200 mg Oral BID  . insulin regular human CONCENTRATED  100 Units Subcutaneous BID WC  . metoprolol tartrate  25 mg Oral BID  . oxyCODONE  30 mg Oral Q6H  . rosuvastatin  40 mg Oral Daily  . sodium chloride  3 mL Intravenous Q12H  . sodium chloride  3 mL Intravenous Q12H  . spironolactone  25 mg Oral Daily  . Warfarin - Pharmacist Dosing Inpatient   Does not apply q1800   Infusions:    Labs:  Recent Labs  12/08/14 0607 12/09/14 0500  NA 135 130*  K 4.2 4.4  CL 98* 90*  CO2 26 28  GLUCOSE 104* 259*  BUN 22* 25*  CREATININE 1.48* 1.79*  CALCIUM 9.1 9.2  MG 2.2  --     Recent Labs  12/08/14 0607 12/09/14 0500  WBC 13.9* 13.7*  HGB 12.0* 12.6*  HCT 37.8* 38.3*  MCV 72.8* 72.0*  PLT 344 362    Radiology/Studies:  Dg Chest 2 View  12/04/2014   CLINICAL DATA:  Chronic shortness of breath with exertion, acute onset of cough approximately 2 days ago. Prior coronary artery  stenting in 2010. Current history of hypertension, diabetes and atrial fibrillation.  EXAM: CHEST  2 VIEW  COMPARISON:  09/20/2014 and earlier.  FINDINGS: AP erect and lateral imaging was performed. Cardiac silhouette markedly enlarged but stable. Thoracic aorta atherosclerotic, unchanged. Hilar and mediastinal contours otherwise unremarkable. Mild pulmonary venous hypertension, unchanged, without evidence of pulmonary edema. Lungs clear. No pleural effusions. Degenerative changes involving the thoracic spine. No significant interval change dating back to July, 2013.  IMPRESSION: Stable cardiomegaly.  No acute cardiopulmonary disease.   Electronically Signed   By: Hulan Saas M.D.   On: 12/04/2014 19:24   US Scrotum  11/27/2014   CLINICAL DATA:  Groin swelling. Fall yesterday, now with testicular pain and swelling.  EXAM: SCROTAL ULTRASOUND  DOPPLER ULTRASOUND OF THE TESTICLES  TECHNIQUE: Complete ultrasound examination of the testicles, epididymis, and other scrotal structures was performed. Color and spectral Doppler ultrasound were also utilized to evaluate blood flow to the testicles.  COMPARISON:  None.  FINDINGS: Right testicle  Measurements: 3.5 x 2.1 x 2.1 cm. No mass or microlithiasis visualized. No testicular hematoma. Homogeneous blood flow noted.  Left testicle  Measurements: 3.8 x 1.9 x 2.4 cm. No mass or microlithiasis visualized. No testicular hematoma. Homogeneous blood flow noted.  Right epididymis:  Normal where visualized, tail  obscured.  Left epididymis:  Not well seen.  Hydrocele:  None visualized.  Varicocele:  None visualized.  Scrotal skin: Diffuse skin and subcutaneous edema is seen. No fluid collection. There is fat within both inguinal canals.  Pulsed Doppler interrogation of both testes demonstrates normal low resistance arterial and venous waveforms bilaterally.  IMPRESSION: 1. Diffuse skin and subcutaneous edema of the scrotum. No focal fluid collection. 2. Normal appearance of  both testis.  Normal blood flow.   Electronically Signed   By: Rubye Oaks M.D.   On: 11/27/2014 03:01   [place] Art/ven Flow Abd Pelv Doppler  11/27/2014   CLINICAL DATA:  Groin swelling. Fall yesterday, now with testicular pain and swelling.  EXAM: SCROTAL ULTRASOUND  DOPPLER ULTRASOUND OF THE TESTICLES  TECHNIQUE: Complete ultrasound examination of the testicles, epididymis, and other scrotal structures was performed. Color and spectral Doppler ultrasound were also utilized to evaluate blood flow to the testicles.  COMPARISON:  None.  FINDINGS: Right testicle  Measurements: 3.5 x 2.1 x 2.1 cm. No mass or microlithiasis visualized. No testicular hematoma. Homogeneous blood flow noted.  Left testicle  Measurements: 3.8 x 1.9 x 2.4 cm. No mass or microlithiasis visualized. No testicular hematoma. Homogeneous blood flow noted.  Right epididymis:  Normal where visualized, tail obscured.  Left epididymis:  Not well seen.  Hydrocele:  None visualized.  Varicocele:  None visualized.  Scrotal skin: Diffuse skin and subcutaneous edema is seen. No fluid collection. There is fat within both inguinal canals.  Pulsed Doppler interrogation of both testes demonstrates normal low resistance arterial and venous waveforms bilaterally.  IMPRESSION: 1. Diffuse skin and subcutaneous edema of the scrotum. No focal fluid collection. 2. Normal appearance of both testis.  Normal blood flow.   Electronically Signed   By: Rubye Oaks M.D.   On: 11/27/2014 03:01   Dg Knee Complete 4 Views Left  11/27/2014   CLINICAL DATA:  Status post fall, with left knee pain. Initial encounter.  EXAM: LEFT KNEE - COMPLETE 4+ VIEW  COMPARISON:  None.  FINDINGS: There is no evidence of fracture or dislocation. The joint spaces are preserved. Chondrocalcinosis is noted; the patellofemoral joint is grossly unremarkable in appearance. A fabella is seen.  No significant joint effusion is seen. The visualized soft tissues are normal in appearance.   IMPRESSION: 1. No evidence of fracture or dislocation. 2. Chondrocalcinosis noted.   Electronically Signed   By: Roanna Raider M.D.   On: 11/27/2014 03:05   Dg Knee Complete 4 Views Right  11/27/2014   CLINICAL DATA:  Status post fall; right knee pain. Initial encounter.  EXAM: RIGHT KNEE - COMPLETE 4+ VIEW  COMPARISON:  None.  FINDINGS: There is no evidence of fracture or dislocation. The joint spaces are preserved. There is narrowing of the patellofemoral compartment, with marginal osteophytes seen arising at all three compartments. Wall osteophytes are also noted. Underlying mild chondrocalcinosis seen. A fabella is noted.  No significant joint effusion is seen. Mild suprapatellar soft tissue swelling is suggested.  IMPRESSION: 1. No evidence of fracture or dislocation. 2. 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 3. Underlying mild chondrocalcinosis noted.   Electronically Signed   By: Roanna Raider M.D.   On: 11/27/2014 03:07     Assessment and Plan  1. Acute hypoxic respiratory failure likely due to combination of PAF, OHS, OSA, deconditioning  2. Acute on chronic diastolic CHF: Echo 12/05/14: mod LVH, EF 50-55%.   3. AKI on CKD stage  III: follow renal function  4. PAF: He was previously in SR on 09/24/14 following cardioversion, back in AF RVR at time of first ED visit 11/27/14. HR is generally controlled when resting. Continue amiodarone load; proceed with DCCV in AM. Continue coumadin. Hopefully reestablishing sinus will improve DOE.  5. CAD s/p PCI to RCA in 2012, prior cath in 01/2014 with elevated L/RH pressures, mild-mod CAD of LAD/LCx with patent RCA  6. DM: Per IM.  7. Dental pain: per IM.   8. Leukocytosis: ? Source. Further eval per IM  9. Microcytic anemia: appears at baseline Following DCCV in AM, patient could likely be DCed from a cardiac standpoint Signed,  Olga Millers MD

## 2014-12-10 DIAGNOSIS — N183 Chronic kidney disease, stage 3 (moderate): Secondary | ICD-10-CM

## 2014-12-10 DIAGNOSIS — Z79899 Other long term (current) drug therapy: Secondary | ICD-10-CM

## 2014-12-10 DIAGNOSIS — Z7901 Long term (current) use of anticoagulants: Secondary | ICD-10-CM

## 2014-12-10 LAB — BASIC METABOLIC PANEL
ANION GAP: 9 (ref 5–15)
BUN: 25 mg/dL — ABNORMAL HIGH (ref 6–20)
CALCIUM: 8.7 mg/dL — AB (ref 8.9–10.3)
CO2: 28 mmol/L (ref 22–32)
Chloride: 91 mmol/L — ABNORMAL LOW (ref 101–111)
Creatinine, Ser: 1.69 mg/dL — ABNORMAL HIGH (ref 0.61–1.24)
GFR calc non Af Amer: 41 mL/min — ABNORMAL LOW (ref >60)
GFR, EST AFRICAN AMERICAN: 48 mL/min — AB (ref >60)
GLUCOSE: 423 mg/dL — AB (ref 65–99)
POTASSIUM: 4.3 mmol/L (ref 3.5–5.1)
Sodium: 128 mmol/L — ABNORMAL LOW (ref 135–145)

## 2014-12-10 LAB — PROTIME-INR
INR: 2.16 — ABNORMAL HIGH (ref 0.00–1.49)
PROTHROMBIN TIME: 23.9 s — AB (ref 11.6–15.2)

## 2014-12-10 LAB — GLUCOSE, CAPILLARY
GLUCOSE-CAPILLARY: 412 mg/dL — AB (ref 65–99)
GLUCOSE-CAPILLARY: 431 mg/dL — AB (ref 65–99)
Glucose-Capillary: 419 mg/dL — ABNORMAL HIGH (ref 65–99)
Glucose-Capillary: 466 mg/dL — ABNORMAL HIGH (ref 65–99)
Glucose-Capillary: 355 mg/dL — ABNORMAL HIGH (ref 65–99)

## 2014-12-10 LAB — OSMOLALITY, URINE: OSMOLALITY UR: 305 mosm/kg — AB (ref 390–1090)

## 2014-12-10 LAB — CREATININE, URINE, RANDOM: Creatinine, Urine: 50.11 mg/dL

## 2014-12-10 MED ORDER — INSULIN ASPART 100 UNIT/ML ~~LOC~~ SOLN
5.0000 [IU] | Freq: Once | SUBCUTANEOUS | Status: AC
  Administered 2014-12-10: 5 [IU] via SUBCUTANEOUS

## 2014-12-10 MED ORDER — WARFARIN SODIUM 5 MG PO TABS
5.0000 mg | ORAL_TABLET | Freq: Once | ORAL | Status: AC
  Administered 2014-12-10: 5 mg via ORAL
  Filled 2014-12-10: qty 1

## 2014-12-10 MED ORDER — OXYCODONE HCL 5 MG PO TABS
30.0000 mg | ORAL_TABLET | Freq: Four times a day (QID) | ORAL | Status: DC | PRN
  Administered 2014-12-10 – 2014-12-11 (×4): 30 mg via ORAL
  Filled 2014-12-10 (×4): qty 6

## 2014-12-10 MED ORDER — AMOXICILLIN-POT CLAVULANATE 875-125 MG PO TABS
1.0000 | ORAL_TABLET | Freq: Two times a day (BID) | ORAL | Status: DC
  Administered 2014-12-10 – 2014-12-11 (×2): 1 via ORAL
  Filled 2014-12-10 (×3): qty 1

## 2014-12-10 MED ORDER — INSULIN REGULAR HUMAN (CONC) 500 UNIT/ML ~~LOC~~ SOLN
50.0000 [IU] | Freq: Two times a day (BID) | SUBCUTANEOUS | Status: DC
  Administered 2014-12-11 (×2): 50 [IU] via SUBCUTANEOUS
  Filled 2014-12-10 (×4): qty 20

## 2014-12-10 MED ORDER — INSULIN REGULAR HUMAN (CONC) 500 UNIT/ML ~~LOC~~ SOLN
100.0000 [IU] | Freq: Two times a day (BID) | SUBCUTANEOUS | Status: DC
  Administered 2014-12-10: 100 [IU] via SUBCUTANEOUS
  Filled 2014-12-10: qty 20

## 2014-12-10 NOTE — Progress Notes (Signed)
UR COMPLETED  

## 2014-12-10 NOTE — Progress Notes (Signed)
Patient refusing CPAP for tonight. 

## 2014-12-10 NOTE — Progress Notes (Signed)
Report given to oncoming RN.

## 2014-12-10 NOTE — Progress Notes (Signed)
[name]:[id number] DOB: 02/10/1951 DOA: 12/04/2014 PCP: [name], MD  Brief narrative:  64 y/o ? P afib chad=3 s/p prior CV in 2012 then DCCV 09/23/14, diastolic CHF c Echo EF55%, cellulitis, OSA not on CPAP 2/2 to intolerance, DM ty 2, HLd, microcytic anemia vs AoCD?, CKD III, prior pilonidal cyst excision 12/2012 Seen 5/18 in ED c scrotal swelling discomfort, mild doe-told to ? lasix to 120 bid.  NON COMPLIANT with those recs. Sent over from Cardiology office and aggressively diuresed--also noted to be back in atrial fibrillation with RVR which may have been contributing to further dyspnea on exertion The patient was seen by cardiology and loaded with amiodarone  in hopes of obtaining rhythm control.  because of the long weekend and it was decided initially to keep him until he diureses adequately and cardiovert him as an outpatient however it now appears patient will be cardioverted on 12/11/14 Noted difficulties managing his diabetes in hospital 2/2 to much ? amount of food taken in and his use of U500 insulin  he has been followed previously by Dr. Helene Kelp Endocrinology  but has not seen him in over a year for management of his   Past medical history-As per Problem list Chart reviewed as below-   Consultants:  Cardiology  Procedures:  None   Antibiotics:  none   Subjective   Overall better in terms of breathing and swelling Sugars still ? ? [no u500 given by nursing as was NPO!!-instead given 5 u SSI coverage??] No cp No awareness of fib   Objective    Interim History:   Telemetry: afib-rates low 100's   Objective: Filed Vitals:   12/09/14 2328 12/10/14 0534 12/10/14 1110 12/10/14 1343  BP: 136/58 128/76 119/58 124/51  Pulse: 89 89 73 87  Temp:  98.4 F (36.9 C)  98.5 F (36.9 C)  TempSrc:  Oral  Oral  Resp:  18  20  Height:      Weight:  166.3 kg (366 lb 10 oz)    SpO2: 99% 98%  96%    Intake/Output Summary (Last 24 hours) at 12/10/14  1610 Last data filed at 12/10/14 1325  Gross per 24 hour  Intake   1680 ml  Output   3775 ml  Net  -2095 ml    Exam:  General: Morbid obesity, Body mass index is 49.71 kg/(m^2).pleasant Cardiovascular: s1 s 2 no m/r/g-cannot assess JVD Respiratory:  Clear no added sound Abdomen: obese Skin grade 2 le edema--skin somewhat wrinkled.  Scrotum  not examined today   Data Reviewed: Basic Metabolic Panel:  Recent Labs Lab 12/06/14 0517 12/07/14 0345 12/08/14 0607 12/09/14 0500 12/10/14 0335  NA 134* 129* 135 130* 128*  K 3.7 4.0 4.2 4.4 4.3  CL 93* 91* 98* 90* 91*  CO2 29 24 26 28 28   GLUCOSE 197* 427* 104* 259* 423*  BUN 20 24* 22* 25* 25*  CREATININE 1.74* 1.73* 1.48* 1.79* 1.69*  CALCIUM 8.5* 8.4* 9.1 9.2 8.7*  MG  --   --  2.2  --   --    Liver Function Tests:  Recent Labs Lab 12/05/14 0430  AST 20  ALT 15*  ALKPHOS 63  BILITOT 0.7  PROT 7.5  ALBUMIN 3.4*   No results for input(s): LIPASE, AMYLASE in the last 168 hours. No results for input(s): AMMONIA in the last 168 hours. CBC:  Recent Labs Lab 12/04/14 1756 12/05/14 0430 12/06/14 0517 12/07/14 0345 12/08/14 12/10/14 12/09/14 0500  WBC 14.1* 11.8* 13.6* 12.2* 13.9* 13.7*  NEUTROABS 10.2*  --   --   --   --   --   HGB 11.6* 11.2* 11.0* 11.5* 12.0* 12.6*  HCT 36.9* 36.4* 35.4* 36.8* 37.8* 38.3*  MCV 74.2* 74.7* 73.8* 73.2* 72.8* 72.0*  PLT 341 338 346 329 344 362   Cardiac Enzymes:  Recent Labs Lab 12/05/14 12/05/14 0430 12/05/14 1031  TROPONINI <0.03 0.03 <0.03   BNP: Invalid input(s): POCBNP CBG:  Recent Labs Lab 12/09/14 1620 12/09/14 2108 12/10/14 0432 12/10/14 0609 12/10/14 1145  GLUCAP 295* 377* 419* 412* 431*    No results found for this or any previous visit (from the past 240 hour(s)).   Studies:              All Imaging reviewed and is as per above notation   Scheduled Meds: . amiodarone  400 mg Oral BID  . amoxicillin-clavulanate  1 tablet Oral Q12H  . ferrous  sulfate  325 mg Oral Daily  . furosemide  80 mg Oral BID  . gabapentin  1,200 mg Oral BID  . insulin regular human CONCENTRATED  100 Units Subcutaneous BID WC  . metoprolol tartrate  25 mg Oral BID  . rosuvastatin  40 mg Oral Daily  . sodium chloride  3 mL Intravenous Q12H  . sodium chloride  3 mL Intravenous Q12H  . spironolactone  25 mg Oral Daily  . warfarin  5 mg Oral ONCE-1800  . Warfarin - Pharmacist Dosing Inpatient   Does not apply q1800   Continuous Infusions:    Assessment/Plan:  1. AECHF//P.AFib--amio load in process.    Diurese adjustment ongoing as per cards- continue Metoprolol 25 bid, Amiodarone 400 bid, Aldactone 25 od, lasix 80 po bid [home dose]-so far minus 12.68 liters, weight 377-->366 lbs 2. DM ty 2 x 27 yrs-long discussion endocrinologist Dr. [name] 5/27.   U 500 dose to 50 tid-->120-->100 bid breakfast and supper-nursing informed to call ME about acute changes of hypo/hyperglycemia. Sugars 200 rang which is much better for him- unfortunately being nothing by mouth on 5/31 caused him to have high blood sugars.  Will need OP follow up-- on discharge he should be discharged home on either 100 units twice a day of his U5 100 or 60 3 times a day scheduled of his U5 100 before meals. he should not use this medications as sliding scale coverage--rather per Dr. Daune Perch initial instructions he should take U500 scheduled as this acts like a 70/30 type insulin and offers basal coverage.  Dr. [name] is out of town until next week but can be reached at phone number for telephonic guidance--I spoke to him again on 5/31 and he concurs with U500 100 bid till seen by him next week 3. Knee pain-oxycodone re-ordered-had appt c Pain Md scheduled for 5/31-he should be provided with a 4 days supply of opiates on d/c home-see.s [place] pain [facility] and missed his OP appt last week because he was here 4. MOd-severe OSA-autopap at night-OP pulm f/u 5. Morbid obesity, Body mass index is 49.71  kg/(m^2).-life threatening. 6. Dental issues + Leukocytosis-supposed to have all teeth extracted 5/26 at [facility] dental.  will need to wait till OP.  Started Augmentin XR 875 until can be re-visited by dentist--should be d/c home on this 7. HLD-continue Crestor 40 daily  Code Status: full Family Communication:  None bedside Disposition Plan: inpatient--per cardiology   Pleas Koch, MD  [name] Pager [phone] 12/10/2014, 4:10 PM  LOS: 5 days

## 2014-12-10 NOTE — Progress Notes (Signed)
Patient Name: [name] Date of Encounter: 12/10/2014  Primary Cardiologist: Dr. [name]   Active Problems:   Diabetes mellitus type 2, uncontrolled, with complications   Atrial fibrillation- DCCV 2010   Chronic anticoagulation- Coumadin   CKD (chronic kidney disease) stage 3, GFR 30-59 ml/min   Acute on chronic diastolic heart failure   Respiratory failure with hypoxia   Hypoxia   On amiodarone therapy    SUBJECTIVE  Denies any CP or SOB. States he does have h/o OSA, however has not used CPAP in 6 month, planning to restart CPAP  CURRENT MEDS . amiodarone  400 mg Oral BID  . ferrous sulfate  325 mg Oral Daily  . furosemide  80 mg Oral BID  . gabapentin  1,200 mg Oral BID  . metoprolol tartrate  25 mg Oral BID  . oxyCODONE  30 mg Oral Q6H  . rosuvastatin  40 mg Oral Daily  . sodium chloride  3 mL Intravenous Q12H  . sodium chloride  3 mL Intravenous Q12H  . spironolactone  25 mg Oral Daily  . warfarin  5 mg Oral ONCE-1800  . Warfarin - Pharmacist Dosing Inpatient   Does not apply q1800    OBJECTIVE  Filed Vitals:   12/09/14 1516 12/09/14 2114 12/09/14 2328 12/10/14 0534  BP: 122/48 136/79 136/58 128/76  Pulse: 78 81 89 89  Temp: 98 F (36.7 C) 97.6 F (36.4 C)  98.4 F (36.9 C)  TempSrc: Oral Oral  Oral  Resp: 20 17  18   Height:      Weight:    366 lb 10 oz (166.3 kg)  SpO2: 96% 97% 99% 98%    Intake/Output Summary (Last 24 hours) at 12/10/14 0945 Last data filed at 12/10/14 0615  Gross per 24 hour  Intake   2020 ml  Output   3825 ml  Net  -1805 ml   Filed Weights   12/08/14 0532 12/09/14 0551 12/10/14 0534  Weight: 368 lb 4.8 oz (167.06 kg) 369 lb 9.6 oz (167.649 kg) 366 lb 10 oz (166.3 kg)    PHYSICAL EXAM  General: Pleasant, NAD. Neuro: Alert and oriented X 3. Moves all extremities spontaneously. Psych: Normal affect. HEENT:  Normal  Neck: Supple without bruits or JVD. Lungs:  Resp regular and unlabored, CTA. Heart: irregular. no  s3, s4, or murmurs. Abdomen: Soft, non-tender, non-distended, BS + x 4.  Extremities: No clubbing, cyanosis. DP/PT/Radials 2+ and equal bilaterally. 1+pitting edema in bilateral LE  Accessory Clinical Findings  CBC  Recent Labs  12/08/14 0607 12/09/14 0500  WBC 13.9* 13.7*  HGB 12.0* 12.6*  HCT 37.8* 38.3*  MCV 72.8* 72.0*  PLT 344 362   Basic Metabolic Panel  Recent Labs  12/08/14 0607 12/09/14 0500 12/10/14 0335  NA 135 130* 128*  K 4.2 4.4 4.3  CL 98* 90* 91*  CO2 26 28 28   GLUCOSE 104* 259* 423*  BUN 22* 25* 25*  CREATININE 1.48* 1.79* 1.69*  CALCIUM 9.1 9.2 8.7*  MG 2.2  --   --     TELE A-fib with HR 70-80s    ECG  No new EKG  Echocardiogram 12/05/2014  LV EF: 50% -  55%  ------------------------------------------------------------------- Indications:   Dyspnea 786.09.  ------------------------------------------------------------------- History:  PMH: Morbid obesity. Chronic low back pain. Obstructive sleep apnea. Coronary artery disease. Congestive heart failure. PMH:  Myocardial infarction. Risk factors: Hypertension. Diabetes mellitus. Dyslipidemia.  ------------------------------------------------------------------- Study Conclusions  - Left ventricle: The cavity size was  normal. Wall thickness was increased in a pattern of moderate LVH. Systolic function was normal. The estimated ejection fraction was in the range of 50% to 55%. Wall motion was normal; there were no regional wall motion abnormalities. - Left atrium: The atrium was mildly dilated.     Radiology/Studies  Dg Chest 2 View  12/04/2014   CLINICAL DATA:  Chronic shortness of breath with exertion, acute onset of cough approximately 2 days ago. Prior coronary artery stenting in 2010. Current history of hypertension, diabetes and atrial fibrillation.  EXAM: CHEST  2 VIEW  COMPARISON:  09/20/2014 and earlier.  FINDINGS: AP erect and lateral imaging was  performed. Cardiac silhouette markedly enlarged but stable. Thoracic aorta atherosclerotic, unchanged. Hilar and mediastinal contours otherwise unremarkable. Mild pulmonary venous hypertension, unchanged, without evidence of pulmonary edema. Lungs clear. No pleural effusions. Degenerative changes involving the thoracic spine. No significant interval change dating back to July, 2013.  IMPRESSION: Stable cardiomegaly.  No acute cardiopulmonary disease.   Electronically Signed       ASSESSMENT AND PLAN  1. Acute hypoxic respiratory failure likely due to combination of PAF, OHS, OSA, deconditioning   2. Acute on chronic diastolic HF  - Echo 12/05/14: mod LVH, EF 50-55%.  - continue metoprolol, lasix, spironolactone.   - still trace amount of edema in the leg, however breathing good. Encouraged restarting CPAP which may help. Likely euvolemic. Discussed with patient the need to be compliant with Na/Fluid intake, daily weight, call cardiology if weight increase by more than 3 lbs overnight or 5 lbs in a single week  3. Acute on chronic stage III renal insufficiency   4. PAF, previously in SR on 09/24/14 following cardioversion, back in AF RVR at time of first ED visit 11/27/14  - started amiodarone loading on 5/26.  - Plan for DCCV tomorrow (had to reschedule, cardioversion schedule full today), if able to maintain NSR without complication for 2 hrs after cardioversion, expect discharge tomorrow afternoon.  5. CAD s/p PCI to RCA in 2012, prior cath in 01/2014 with elevated L/RH pressures, mild-mod CAD of LAD/LCx with patent RCA  6. DM  7. Microcytic anemia  8. Dental pain  Signed, Azalee Course PA-C Pager: [phone]   The patient was seen, examined and discussed with Azalee Course, PA-C and I agree with the above.   64 year old male admitted with acute on chronic CHF, with good response to iv diuretics, on home dose furosemide now 80 mg po BID, Crea is stable. Negative 1.8 L since yesterday, minimal  residual LE edema on physical exam. He is in a-fib rate controlled, awaiting DCCV tomorrow. On warfarin for anticoagulation.  Lars Masson, MD, [facility] 12/10/2014

## 2014-12-10 NOTE — Progress Notes (Signed)
ANTICOAGULATION CONSULT NOTE  Pharmacy Consult for warfarin Indication: atrial fibrillation  No Known Allergies  Patient Measurements: Height: 6' (182.9 cm) Weight: (!) 366 lb 10 oz (166.3 kg) IBW/kg (Calculated) : 77.6  Labs:  Recent Labs  12/08/14 0607 12/09/14 0500 12/10/14 0335  HGB 12.0* 12.6*  --   HCT 37.8* 38.3*  --   PLT 344 362  --   LABPROT 25.0* 23.6* 23.9*  INR 2.30* 2.12* 2.16*  CREATININE 1.48* 1.79* 1.69*    Estimated Creatinine Clearance: 71.6 mL/min (by C-G formula based on Cr of 1.69).   Assessment: 34 yom presented to the hospital on 12/04/2014 with dyspnea. He is on warfarin for hx of afib. INR was elevated at coumadin clinic on admission (may be lab error since INR trended down significantly the next day).   INR now remains therapeutic at 2.16 (missed dose on 5/25 and lower dose on 5/26). CBC remains low but has improving with no bleeding noted.  Of note, patient initiated on amiodarone during this admission which will eventually have an effect on increasing warfarin sensitivity so the dose may need to be adjusted down at some point.  Home dose = 5 mg daily with 7.5 mg Tuesday and Thursday  Goal of Therapy:  INR 2-3 Monitor platelets by anticoagulation protocol: Yes   Plan:  Coumadin 5 mg po x 1 tonight Daily INR Monitor CBC and for any s/s bleeding  Leota Sauers Pharm.D. CPP, BCPS Clinical Pharmacist [phone] 12/10/2014 8:42 AM

## 2014-12-10 NOTE — Progress Notes (Signed)
Inpatient Diabetes Program Recommendations  AACE/ADA: New Consensus Statement on Inpatient Glycemic Control (2013)  Target Ranges:  Prepandial:   less than 140 mg/dL      Peak postprandial:   less than 180 mg/dL (1-2 hours)      Critically ill patients:  140 - 180 mg/dL   Results for [name], [name] (MRN [id number]) as of 12/10/2014 08:18  Ref. Range 12/09/2014 11:11 12/09/2014 16:20 12/09/2014 21:08 12/10/2014 04:32 12/10/2014 06:09  Glucose-Capillary Latest Ref Range: 65-99 mg/dL 326 (H) 712 (H) 458 (H) 419 (H) 412 (H)   Diabetes history: DM 2 Outpatient Diabetes medications: U-500 (CBG 200-250 = 70 units, CBG 251-300 = 90 units, CBG > 301 = 120 units)  Noted Patient only got one dose of U-500 100 units yesterday before lunch. If it was given again patient may not have as high of glucose levels.  Inpatient Diabetes Program Recommendations  Correction (SSI): Noted patient is NPO for cardioversion. While patient is NPO please consider ordering Novolog 0-20 units Q4hrs.   When patient is eating again, consider U-500 correction scale from home, however reduce the number of units from 120 units to 100 units for CBG >301. 120 units when given consecutively may cause hypoglycemia while inpatient.  Thanks,  Christena Deem RN, MSN, [facility] Inpatient Diabetes Coordinator Team Pager [phone]

## 2014-12-10 NOTE — Progress Notes (Signed)
Patient refused CPAP. RT will continue to monitor.  

## 2014-12-10 NOTE — Progress Notes (Signed)
Pt upset with his insulin regimen in the hospital. Per MD note, pt to have U500 BID with meals, order to give half of dose in AM when PT is NPO for procedure. Pt CBG 355 tonight, no HS insulin coverage ordered. MD on call with triad paged via amion textpage. Pt requesting snack tonight as will be NPO after midnight. Will continue to monitor. Huel Coventry, RN

## 2014-12-10 NOTE — Progress Notes (Signed)
Pt. Continues to request snacks and drinks. RN informed pt. That he was on a fluid restriction. Pt. Stated he was going to be without food after midnight and that he still wanted something to drink. On call NP on the unit and notified. Zarrah Loveland, Cheryll Dessert

## 2014-12-11 ENCOUNTER — Encounter (HOSPITAL_COMMUNITY)
Admission: EM | Disposition: A | Discharge status: Home / Self Care | Payer: Self-pay | Source: Home / Self Care | Attending: Family Medicine

## 2014-12-11 ENCOUNTER — Inpatient Hospital Stay (HOSPITAL_COMMUNITY): Payer: Medicare Other | Admitting: Anesthesiology

## 2014-12-11 ENCOUNTER — Encounter (HOSPITAL_COMMUNITY): Payer: Self-pay | Admitting: *Deleted

## 2014-12-11 DIAGNOSIS — I4891 Unspecified atrial fibrillation: Secondary | ICD-10-CM

## 2014-12-11 DIAGNOSIS — I481 Persistent atrial fibrillation: Secondary | ICD-10-CM

## 2014-12-11 HISTORY — PX: CARDIOVERSION: SHX1299

## 2014-12-11 LAB — GLUCOSE, CAPILLARY
GLUCOSE-CAPILLARY: 176 mg/dL — AB (ref 65–99)
Glucose-Capillary: 167 mg/dL — ABNORMAL HIGH (ref 65–99)
Glucose-Capillary: 280 mg/dL — ABNORMAL HIGH (ref 65–99)
Glucose-Capillary: 418 mg/dL — ABNORMAL HIGH (ref 65–99)

## 2014-12-11 LAB — CREATININE, SERUM
Creatinine, Ser: 1.88 mg/dL — ABNORMAL HIGH (ref 0.61–1.24)
GFR calc Af Amer: 42 mL/min — ABNORMAL LOW (ref >60)
GFR, EST NON AFRICAN AMERICAN: 36 mL/min — AB (ref >60)

## 2014-12-11 LAB — BASIC METABOLIC PANEL
ANION GAP: 15 (ref 5–15)
BUN: 25 mg/dL — AB (ref 6–20)
CO2: 27 mmol/L (ref 22–32)
Calcium: 9.5 mg/dL (ref 8.9–10.3)
Chloride: 92 mmol/L — ABNORMAL LOW (ref 101–111)
Creatinine, Ser: 1.82 mg/dL — ABNORMAL HIGH (ref 0.61–1.24)
GFR calc Af Amer: 44 mL/min — ABNORMAL LOW (ref >60)
GFR, EST NON AFRICAN AMERICAN: 38 mL/min — AB (ref >60)
Glucose, Bld: 216 mg/dL — ABNORMAL HIGH (ref 65–99)
Potassium: 4.3 mmol/L (ref 3.5–5.1)
Sodium: 134 mmol/L — ABNORMAL LOW (ref 135–145)

## 2014-12-11 LAB — UREA NITROGEN, URINE: UREA NITROGEN UR: 361 mg/dL

## 2014-12-11 LAB — PROTIME-INR
INR: 2.29 — ABNORMAL HIGH (ref 0.00–1.49)
Prothrombin Time: 25 seconds — ABNORMAL HIGH (ref 11.6–15.2)

## 2014-12-11 SURGERY — CARDIOVERSION
Anesthesia: General

## 2014-12-11 MED ORDER — PROPOFOL 10 MG/ML IV BOLUS
INTRAVENOUS | Status: DC | PRN
  Administered 2014-12-11: 120 mg via INTRAVENOUS

## 2014-12-11 MED ORDER — OXYCODONE HCL 30 MG PO TABS: 30.0000 mg | ORAL_TABLET | Freq: Four times a day (QID) | ORAL | Status: DC

## 2014-12-11 MED ORDER — WARFARIN SODIUM 5 MG PO TABS
5.0000 mg | ORAL_TABLET | Freq: Once | ORAL | Status: AC
Start: 2014-12-11 — End: 2014-12-11
  Administered 2014-12-11: 5 mg via ORAL
  Filled 2014-12-11: qty 1

## 2014-12-11 MED ORDER — AMIODARONE HCL 200 MG PO TABS: 200.0000 mg | ORAL_TABLET | Freq: Two times a day (BID) | ORAL | Status: DC

## 2014-12-11 MED ORDER — SODIUM CHLORIDE 0.9 % IV SOLN
INTRAVENOUS | Status: DC
  Administered 2014-12-11: 500 mL via INTRAVENOUS
  Administered 2014-12-11: 10:00:00 via INTRAVENOUS

## 2014-12-11 MED ORDER — AMOXICILLIN-POT CLAVULANATE 875-125 MG PO TABS: 1.0000 | ORAL_TABLET | Freq: Two times a day (BID) | ORAL | Status: DC

## 2014-12-11 MED ORDER — INSULIN REGULAR HUMAN (CONC) 500 UNIT/ML ~~LOC~~ SOLN: 100.0000 [IU] | Freq: Two times a day (BID) | SUBCUTANEOUS | Status: DC

## 2014-12-11 NOTE — Op Note (Signed)
Procedure: Electrical Cardioversion Indications:  Atrial Fibrillation  Procedure Details:  Consent: Risks of procedure as well as the alternatives and risks of each were explained to the (patient/caregiver).  Consent for procedure obtained.  Time Out: Verified patient identification, verified procedure, site/side was marked, verified correct patient position, special equipment/implants available, medications/allergies/relevent history reviewed, required imaging and test results available.  Performed  Patient placed on cardiac monitor, pulse oximetry, supplemental oxygen as necessary.  Sedation given: propofol 120 mg IV Pacer pads placed anterior and posterior chest.  Cardioverted 1 time(s).  Cardioversion with synchronized biphasic 120J shock.  Evaluation: Findings: Post procedure EKG shows: NSR Complications: None Patient did tolerate procedure well.  Time Spent Directly with the Patient:  60 minutes   Kalley Nicholl 12/11/2014, 10:53 AM

## 2014-12-11 NOTE — Interval H&P Note (Signed)
History and Physical Interval Note:  12/11/2014 8:18 AM  [name]  has presented today for surgery, with the diagnosis of afib  The various methods of treatment have been discussed with the patient and family. After consideration of risks, benefits and other options for treatment, the patient has consented to  Procedure(s): CARDIOVERSION (N/A) as a surgical intervention .  The patient's history has been reviewed, patient examined, no change in status, stable for surgery.  I have reviewed the patient's chart and labs.  Questions were answered to the patient's satisfaction.     Marina Desire

## 2014-12-11 NOTE — H&P (View-Only) (Signed)
 Cardiology Office Note Date:  12/04/2014  Patient ID:  [name], DOB 12/14/1950, MRN [id number] PCP:  KOIRALA,DIBAS, MD  Cardiologist: Dr. Crenshaw    Chief Complaint: SOB  History of Present Illness: [name] is a 64 y.o. male with history of chronic diastolic CHF, CAD (s/p PCI to RCA in 2012, prior cath in 01/2014 with elevated L/RH pressures, mild-mod CAD of LAD/LCx with patent RCA, normal EF, c/b CIN/CHF), morbid obesity, paroxysmal atrial fibrillation, cellulitis, OSA (unable to tolerate CPAP), DM, HLD, anemia, CKD stage III, deconditioning who presented as an add-on visit for profound dyspnea.  Last admission was in 09/2014 when he presented to the office with tachypnea, tachycardia and hypoxia, and 19lb weight gain (408lb). He was admitted for IV diuresis and IV diltiazem for AF-RVR (109bpm). He underwent a TEE cardioversion on 09/23/2014 which showed EF 55-60%, no evidence of vegetation or LV thrombus, moderate TR, mild MR. He subsequent received cardioversion. He qualified for home O2. Discharge weight 396lb.  He was seen in the ER 11/27/14 for fall when his blood sugar dropped, scrotal pain and difficulty with urination. Labs showed Na 140, K 3.2, Cl 100, CO2 26, Glu 243, BUN 9, Cr 1.38, albumin 2.9. WBC was 13.9, microcytic anemia Hgb 11.1, plt 306. He had hypoglycemic episode in the ER. Scrotal sx were felt due to hydrocele. His Lasix was increased to 120mg BID for the next 5 days and potassium was increased. He says he did not make these medicine changes. He came in today for INR check because he was awaiting dental procedure tomorrow for severe tooth pain. He was notice by Coumadin Clinic nurse to have labored breathing requiring further evaluation. EKG shows he is back in afib, rate 94bpm at rest. Initial VS with BP 144/78 and pulse ox of 97%. However, with minimal activity he would become severely dyspneic with pulse ox of 79%, increased RR, panting, and HR up to the 120s. It  took about 10 minutes for HR and pulse ox to stabilize. He denies any CP or palpitations. He complains of dyspnea, significant knee pain, and severe tooth pain.  Past Medical History  Diagnosis Date  . Hypertension   . Chronic diastolic CHF (congestive heart failure)   . CAD (coronary artery disease)     a. s/p PCI to RCA in 2012. b. prior cath in 01/2014 with elevated L/RH pressures, mild-mod CAD of LAD/LCx with patent RCA, normal EF, c/b CIN/CHF.  . PAF (paroxysmal atrial fibrillation)     TEE DCCV 09/23/2014  . Anemia   . Pilonidal cyst 1980's; 01/25/2013  . Shortness of breath     with ambulation, with exertion  . Neuropathy   . History of blood transfusion ~ 1954    "related to OR"  . Carpal tunnel syndrome, bilateral   . Morbid obesity   . Myocardial infarction     pt stated he had a slient heart attack that showed up on nuclear stress.  . Hyperlipidemia   . OSA (obstructive sleep apnea)     "I wear nasal prongs; haven't been using prongs recently" (09/19/2014)  . Type II diabetes mellitus   . Arthritis     "hands and lower back" (09/19/2014)  . Chronic lower back pain   . Chronic kidney disease (CKD), stage III (moderate)   . Cellulitis   . Hypoxia     a. Qualified for home O2 at DC in 09/2014.  . Physical deconditioning     Past Surgical   History  Procedure Laterality Date  . Abdominal surgery  ~ 1954    BENIGN TUMOR REMOVED  . Debridement  foot Left     debriding diabetic foot ulcers  . Cataract extraction w/phaco Right 11/15/2012    Procedure: CATARACT EXTRACTION PHACO AND INTRAOCULAR LENS PLACEMENT (IOC);  Surgeon: Greer Geiger, MD;  Location: [facility] OR;  Service: Ophthalmology;  Laterality: Right;  . Cataract extraction w/phaco Left 11/29/2012    Procedure: CATARACT EXTRACTION PHACO AND INTRAOCULAR LENS PLACEMENT (IOC);  Surgeon: Greer Geiger, MD;  Location: [facility] OR;  Service: Ophthalmology;  Laterality: Left;  . Pilonidal cyst excision N/A 01/08/2013    Procedure: CYST  EXCISION PILONIDAL EXTENSIVE;  Surgeon: Armando Ramirez, MD;  Location: [facility] OR;  Service: General;  Laterality: N/A;  . Foreign body removal Right 2014    heel,  splinter removal   . Pars plana vitrectomy Left 06/05/2013    Procedure: PARS PLANA VITRECTOMY WITH 23 GAUGE with Endolaser(constellation);  Surgeon: Greer Geiger, MD;  Location: [facility] OR;  Service: Ophthalmology;  Laterality: Left;  . Left and right heart catheterization with coronary angiogram N/A 01/31/2014    Procedure: LEFT AND RIGHT HEART CATHETERIZATION WITH CORONARY ANGIOGRAM;  Surgeon: Michael D Cooper, MD;  Location: [facility] CATH LAB;  Service: Cardiovascular;  Laterality: N/A;  . Tonsillectomy    . Appendectomy    . Eye surgery    . Coronary angioplasty with stent placement  August 2012    RCA DES - [facility]  . Cardiac catheterization  01/2014  . Cardioversion  2010    /notes 09/19/2014  . Pilonidal cyst excision  1980's    "in [place]"  . Tee without cardioversion N/A 09/23/2014    Procedure: TRANSESOPHAGEAL ECHOCARDIOGRAM (TEE);  Surgeon: Brian S Crenshaw, MD;  Location: [facility] ENDOSCOPY;  Service: Cardiovascular;  Laterality: N/A;  . Cardioversion N/A 09/23/2014    Procedure: CARDIOVERSION;  Surgeon: Brian S Crenshaw, MD;  Location: [facility] ENDOSCOPY;  Service: Cardiovascular;  Laterality: N/A;    Current Outpatient Prescriptions  Medication Sig Dispense Refill  . ACCU-CHEK AVIVA PLUS test strip   0  . ACCU-CHEK FASTCLIX LANCETS MISC     . BD INSULIN SYRINGE ULTRAFINE 31G X 15/64" 0.3 ML MISC   0  . Ferrous Sulfate (IRON) 325 (65 FE) MG TABS Take 1 tablet by mouth daily.    . furosemide (LASIX) 40 MG tablet Take 1 tablet (40 mg total) by mouth 2 (two) times daily. 10 tablet 0  . furosemide (LASIX) 80 MG tablet Take 1 tablet (80 mg total) by mouth 2 (two) times daily. 60 tablet 5  . gabapentin (NEURONTIN) 300 MG capsule Take 1,200 mg by mouth 2 (two) times daily.     . Hypromellose (NATURAL BALANCE TEARS  OP) Place 1 drop into both eyes daily as needed.    . insulin regular human CONCENTRATED (HUMULIN R) 500 UNIT/ML SOLN injection Inject 90-120 Units into the skin 3 (three) times daily with meals.     . lisinopril (PRINIVIL,ZESTRIL) 10 MG tablet Take 1 tablet by mouth daily.    . metoprolol tartrate (LOPRESSOR) 25 MG tablet Take 1 tablet (25 mg total) by mouth 2 (two) times daily. 60 tablet 5  . Multiple Vitamin (MULTIVITAMIN WITH MINERALS) TABS Take 1 tablet by mouth daily.    . oxycodone (ROXICODONE) 30 MG immediate release tablet Take 30 mg by mouth 4 (four) times daily.     . Podiatric Products (DIADERM FOOT REJUVENATING) CREA Apply 1 application   topically daily as needed (dry skin).    . potassium chloride SA (KLOR-CON M20) 20 MEQ tablet Take 1 tablet (20 mEq total) by mouth 2 (two) times daily. 60 tablet 6  . rosuvastatin (CRESTOR) 40 MG tablet Take 40 mg by mouth daily.    . spironolactone (ALDACTONE) 25 MG tablet Take 1 tablet (25 mg total) by mouth daily. 30 tablet 2  . TRULICITY 0.75 MG/0.5ML SOPN Take 1 tablet by mouth once a week.    . warfarin (COUMADIN) 5 MG tablet TAKE AS DIRECTED BY COUMADIN CLINIC (Patient taking differently: Take 5mg everyday except Tuesday and Thursday take 7.5 mg) 45 tablet 2   No current facility-administered medications for this visit.    Allergies:   Review of patient's allergies indicates no known allergies.   Social History:  The patient  reports that he quit smoking about 31 years ago. His smoking use included Cigarettes. He has a 20 pack-year smoking history. He has never used smokeless tobacco. He reports that he does not drink alcohol or use illicit drugs.   Family History:  The patient's family history includes Other in his other. He was adopted.  ROS:  Please see the history of present illness.    All other systems are reviewed and otherwise negative.   PHYSICAL EXAM: VS:  BP 144/78 mmHg  Pulse 88  Resp 22  Wt 374 lb 6.4 oz (169.827 kg)   SpO2 97% BMI: Body mass index is 50.77 kg/(m^2).  Temp 97.6. Pulse ox 79% with ambulation with significant dyspnea. Well nourished obese WM with grayish complexion, panting at times - improved once place on O2 and resting in chair HEENT: normocephalic, atraumatic Neck: no JVD, carotid bruits or masses Cardiac:  Irregularly irregular, borderline tachycardic, S1, S2; RRR; no murmurs, rubs, or gallops Lungs:  clear to auscultation bilaterally, no wheezing, rhonchi or rales Abd: soft, nontender, no hepatomegaly, + BS MS: no deformity or atrophy Ext: trace edema with chronic venous stasis changes and baseline large leg habitus Skin: warm and dry, no rash Neuro:  moves all extremities spontaneously, no focal abnormalities noted, follows commands Psych: euthymic mood, full affect  EKG:  Done today shows atrial fibrillation 94bpm, low voltage QRS, cannot rule out anterior infarct, age undetermined. He was actually back in AF RVR on 11/27/14.  Recent Labs: 02/11/2014: Magnesium 1.7; TSH 0.408 09/19/2014: B Natriuretic Peptide 153.2* 11/27/2014: ALT 18; BUN 9; Creatinine 1.38*; Hemoglobin 11.1*; Platelets 306; Potassium 3.2*; Sodium 140  No results found for requested labs within last 365 days.   Estimated Creatinine Clearance: 88.7 mL/min (by C-G formula based on Cr of 1.38).   Wt Readings from Last 3 Encounters:  12/04/14 374 lb 6.4 oz (169.827 kg)  11/27/14 372 lb (168.738 kg)  09/24/14 396 lb 6.4 oz (179.806 kg)     Other studies reviewed: Additional studies/records reviewed today include: summarized above  ASSESSMENT AND PLAN:  1. Acute hypoxic respiratory failure with hypoxia and dyspnea 2. Worsening dental pain - INR 4.2 3. Paroxysmal atrial fibrillation, back in AF, with supratherapeutic INR 4. Chronic diastolic CHF 5. Morbid obesity with OSA and probable component of OHS 6. Microcytic anemia 7. CKD stage III with recent significant hypokalemia 8. Recent fall with knee pain 9. DM  with recent hypoglycemia 10. Testicular swelling and discomfort 11. Recent leukocytosis.  Complex clinical picture. He presented as an add-on to clinic today with dyspnea, hypoxia, and grayish complexion. He is noted to be back in AF. It appears he was   back in AF as of ER visit 11/27/14. Today he was initially rate controlled but was still SOB during this time. Rate did increase with exertion. Weight is stable compared to prior (prior DC weight was 396 -> he is 374 today.) Lung exam seems out of proportion for hypoxia. He complains of significant knee/tooth pain. Recommend he proceed to ER for further evaluation. Since HR came down with rest, it is not clear that his home AVN blocking agents will need to be adjusted. Recent leukocytosis is concerning for an underlying process which has driven the above processes. Would recommend further evaluation by ER/internal medicine.   Our service will follow-up in AM.  Disposition: F/u to be determined depending on ER course.  Current medicines are reviewed at length with the patient today.  The patient did not have any concerns regarding medicines.  Signed, Namon Villarin PA-C 12/04/2014 4:36 PM     [facility] [address] [place] [address] [phone] (office)  [phone] (fax)   

## 2014-12-11 NOTE — Anesthesia Preprocedure Evaluation (Addendum)
Anesthesia Evaluation  Patient identified by MRN, date of birth, ID band Patient awake    Reviewed: Allergy & Precautions, NPO status , Patient's Chart, lab work & pertinent test results  Airway Mallampati: II   Neck ROM: Full    Dental  (+) Poor Dentition, Missing,    Pulmonary shortness of breath, sleep apnea , former smoker,  breath sounds clear to auscultation        Cardiovascular hypertension, Pt. on medications + CAD, + Past MI and +CHF Rhythm:Irregular Rate:Normal  09/2014 TTE: Study Conclusions: Left ventricle: The cavity size was normal. Wall thickness wasincreased in a pattern of moderate LVH. Systolic function was normal. The estimated ejection fraction was in the range of 50%to 55%. Wall motion was normal; there were no regional wallmotion abnormalities. - Left atrium: The atrium was mildly dilated.    Neuro/Psych negative neurological ROS     GI/Hepatic negative GI ROS, Neg liver ROS,   Endo/Other  diabetes, Type 2, Insulin DependentMorbid obesity  Renal/GU Renal InsufficiencyRenal disease     Musculoskeletal  (+) Arthritis -,   Abdominal (+) + obese,   Peds  Hematology  (+) anemia ,   Anesthesia Other Findings   Reproductive/Obstetrics                            Anesthesia Physical  Anesthesia Plan  ASA: IV  Anesthesia Plan: General   Post-op Pain Management:    Induction: Intravenous  Airway Management Planned: Mask and Natural Airway  Additional Equipment:   Intra-op Plan:   Post-operative Plan:   Informed Consent: I have reviewed the patients History and Physical, chart, labs and discussed the procedure including the risks, benefits and alternatives for the proposed anesthesia with the patient or authorized representative who has indicated his/her understanding and acceptance.     Plan Discussed with: CRNA  Anesthesia Plan Comments:          Anesthesia Quick Evaluation

## 2014-12-11 NOTE — Progress Notes (Signed)
Patient Name: [name] Date of Encounter: 12/11/2014  Primary Cardiologist: Dr. [name]   Active Problems:   Diabetes mellitus type 2, uncontrolled, with complications   Atrial fibrillation- DCCV 2010   Chronic anticoagulation- Coumadin   CKD (chronic kidney disease) stage 3, GFR 30-59 ml/min   Acute on chronic diastolic heart failure   Respiratory failure with hypoxia   Hypoxia   On amiodarone therapy    SUBJECTIVE  Denies any CP or SOB. Back pain has been hurting him  CURRENT MEDS . amiodarone  400 mg Oral BID  . amoxicillin-clavulanate  1 tablet Oral Q12H  . ferrous sulfate  325 mg Oral Daily  . furosemide  80 mg Oral BID  . gabapentin  1,200 mg Oral BID  . insulin regular human CONCENTRATED  50 Units Subcutaneous BID WC  . metoprolol tartrate  25 mg Oral BID  . rosuvastatin  40 mg Oral Daily  . sodium chloride  3 mL Intravenous Q12H  . sodium chloride  3 mL Intravenous Q12H  . spironolactone  25 mg Oral Daily  . Warfarin - Pharmacist Dosing Inpatient   Does not apply q1800    OBJECTIVE  Filed Vitals:   12/10/14 1343 12/10/14 2101 12/10/14 2130 12/11/14 0435  BP: 124/51 151/84  119/59  Pulse: 87 76 87 84  Temp: 98.5 F (36.9 C) 98.4 F (36.9 C)  98.5 F (36.9 C)  TempSrc: Oral Oral  Oral  Resp: 20 20  20   Height:      Weight:    362 lb 3.2 oz (164.293 kg)  SpO2: 96% 95%  94%    Intake/Output Summary (Last 24 hours) at 12/11/14 0845 Last data filed at 12/11/14 0734  Gross per 24 hour  Intake   1060 ml  Output   2625 ml  Net  -1565 ml   Filed Weights   12/09/14 0551 12/10/14 0534 12/11/14 0435  Weight: 369 lb 9.6 oz (167.649 kg) 366 lb 10 oz (166.3 kg) 362 lb 3.2 oz (164.293 kg)    PHYSICAL EXAM  General: Pleasant, NAD. Neuro: Alert and oriented X 3. Moves all extremities spontaneously. Psych: Normal affect. HEENT:  Normal  Neck: Supple without bruits or JVD. Lungs:  Resp regular and unlabored, CTA. Heart: irregular. no s3, s4, or  murmurs. Abdomen: Soft, non-tender, non-distended, BS + x 4.  Extremities: No clubbing, cyanosis. DP/PT/Radials 2+ and equal bilaterally. 1+pitting edema in bilateral LE  Accessory Clinical Findings  CBC  Recent Labs  12/09/14 0500  WBC 13.7*  HGB 12.6*  HCT 38.3*  MCV 72.0*  PLT 362   Basic Metabolic Panel  Recent Labs  12/09/14 0500 12/10/14 0335 12/11/14 0550  NA 130* 128*  --   K 4.4 4.3  --   CL 90* 91*  --   CO2 28 28  --   GLUCOSE 259* 423*  --   BUN 25* 25*  --   CREATININE 1.79* 1.69* 1.88*  CALCIUM 9.2 8.7*  --     TELE A-fib with HR 70-80s    ECG  No new EKG  Echocardiogram 12/05/2014  LV EF: 50% -  55%  ------------------------------------------------------------------- Indications:   Dyspnea 786.09.  ------------------------------------------------------------------- History:  PMH: Morbid obesity. Chronic low back pain. Obstructive sleep apnea. Coronary artery disease. Congestive heart failure. PMH:  Myocardial infarction. Risk factors: Hypertension. Diabetes mellitus. Dyslipidemia.  ------------------------------------------------------------------- Study Conclusions  - Left ventricle: The cavity size was normal. Wall thickness was increased in a pattern of moderate  LVH. Systolic function was normal. The estimated ejection fraction was in the range of 50% to 55%. Wall motion was normal; there were no regional wall motion abnormalities. - Left atrium: The atrium was mildly dilated.     Radiology/Studies  Dg Chest 2 View  12/04/2014   CLINICAL DATA:  Chronic shortness of breath with exertion, acute onset of cough approximately 2 days ago. Prior coronary artery stenting in 2010. Current history of hypertension, diabetes and atrial fibrillation.  EXAM: CHEST  2 VIEW  COMPARISON:  09/20/2014 and earlier.  FINDINGS: AP erect and lateral imaging was performed. Cardiac silhouette markedly enlarged but stable. Thoracic aorta  atherosclerotic, unchanged. Hilar and mediastinal contours otherwise unremarkable. Mild pulmonary venous hypertension, unchanged, without evidence of pulmonary edema. Lungs clear. No pleural effusions. Degenerative changes involving the thoracic spine. No significant interval change dating back to July, 2013.  IMPRESSION: Stable cardiomegaly.  No acute cardiopulmonary disease.   Electronically Signed       ASSESSMENT AND PLAN  1. Acute hypoxic respiratory failure likely due to combination of PAF, OHS, OSA, deconditioning   2. Acute on chronic diastolic HF  - Echo 12/05/14: mod LVH, EF 50-55%.  - continue metoprolol, lasix, spironolactone.   - still trace amount of edema in the leg, however breathing good. Encouraged restarting CPAP which may help. Likely euvolemic. Discussed with patient the need to be compliant with Na/Fluid intake, daily weight, call cardiology if weight increase by more than 3 lbs overnight or 5 lbs in a single week  3. Acute on chronic stage III renal insufficiency   4. PAF, previously in SR on 09/24/14 following cardioversion, back in AF RVR at time of first ED visit 11/27/14  - started amiodarone loading on 5/26.  - scheduled for DCCV today, if able to maintain NSR without complication for 2 hrs after cardioversion, can potentially be discharged. Will arrange outpatient followup. Today is the 7th day of high dose amiodarone, after today, can switch to 200mg  BID of amiodarone on discharge for 1 month before starting 200mg  daily maintenance dose of amiodarone.   5. CAD s/p PCI to RCA in 2012, prior cath in 01/2014 with elevated L/RH pressures, mild-mod CAD of LAD/LCx with patent RCA  6. DM  7. Microcytic anemia  8. Dental pain  Signed, 2013 PA-C Pager: [phone]/2014  The patient was seen, examined and discussed with Azalee Course, PA-C and I agree with the above.   64 year old male admitted with acute on chronic CHF, with good response to iv diuretics, on home dose  furosemide now 80 mg po BID, Crea is stable. Negative 1.5 L since yesterday,appears euvolemic on physical exam. He underwent a DCCV today, currently in SR. We will continue amiodarone with schedule as discribed above. We will arrange for an outpatient follow up at the cardiology clinic with Dr Azalee Course. On warfarin for anticoagulation. INR today 2.29.  65, MD, [facility] 12/11/2014

## 2014-12-11 NOTE — Progress Notes (Signed)
D/C Tele, D/C IV, D/C paperwork reviewed with pt. And paperwork given to pt., pt. Verbalized understanding of paperwork and D/C instructions, prescriptions given to pt., pt. Belongings packed by pt., Pt. Transported off unit via wheelchair and transported home by his sister. Pt. Home U500 insulin retrieved from pharmacy and sent home with pt.

## 2014-12-11 NOTE — Transfer of Care (Signed)
Immediate Anesthesia Transfer of Care Note  Patient: [name]  Procedure(s) Performed: Procedure(s): CARDIOVERSION (N/A)  Patient Location: PACU and Endoscopy Unit  Anesthesia Type:MAC  Level of Consciousness: awake, alert  and oriented  Airway & Oxygen Therapy: Patient Spontanous Breathing and Patient connected to nasal cannula oxygen  Post-op Assessment: Report given to RN and Post -op Vital signs reviewed and stable  Post vital signs: Reviewed and stable  Last Vitals:  Filed Vitals:   12/11/14 1009  BP:   Pulse:   Temp: 36.4 C  Resp:     Complications: No apparent anesthesia complications

## 2014-12-11 NOTE — Anesthesia Postprocedure Evaluation (Signed)
  Anesthesia Post-op Note  Patient: [name]  Procedure(s) Performed: Procedure(s): CARDIOVERSION (N/A)  Patient Location: PACU  Anesthesia Type:General  Level of Consciousness: awake and alert   Airway and Oxygen Therapy: Patient Spontanous Breathing  Post-op Pain: none  Post-op Assessment: Post-op Vital signs reviewed  Post-op Vital Signs: Reviewed  Last Vitals:  Filed Vitals:   12/11/14 1118  BP:   Pulse:   Temp: 36.4 C  Resp:     Complications: No apparent anesthesia complications

## 2014-12-11 NOTE — Discharge Summary (Signed)
Physician Discharge Summary  [name] LGX:[id number] DOB: 1951/05/09 DOA: 12/04/2014  PCP: Paul Bussing, MD  Admit date: 12/04/2014 Discharge date: 12/11/2014  Time spent: 65 minutes  Recommendations for Outpatient Follow-up:  1. Patient is to follow-up with Paul Bussing, MD A1 to 2 weeks. 2. Patient is to follow-up with [facility]  as scheduled. 3. Patient is to follow-up with his endocrinologist, Dr. Sharl [name] in 1 week.   Discharge Diagnoses:  Principal Problem:   Acute on chronic diastolic heart failure Active Problems:   Atrial fibrillation- DCCV 2010   Respiratory failure with hypoxia   Diabetes mellitus type 2, uncontrolled, with complications   Chronic anticoagulation- Coumadin   CKD (chronic kidney disease) stage 3, GFR 30-59 ml/min   Hypoxia   On amiodarone therapy   Discharge Condition: Stable and improved  Diet recommendation: Heart healthy/carb modified  Filed Weights   12/09/14 0551 12/10/14 0534 12/11/14 0435  Weight: 167.649 kg (369 lb 9.6 oz) 166.3 kg (366 lb 10 oz) 164.293 kg (362 lb 3.2 oz)    History of present illness:  Per Dr. Selena [name] 64 yo male with Pafib (CHADS2=3), CHF (diastolic), CAD, was having his INR checked and he was noted to be hypoxic. Pt was sent to ED for evaluation.Patient was seen in the ED on 11/27/2014 with scrotal swelling and discomfort and mild shortness of breath and told to increase his Lasix to 120 mg twice daily. Patient was not compliant with those recommendations and a such sent from the cardiologist's office for aggressive diuresis. Pt denied fever, chills, cough, cp, palp, n/v, diarrhea, brbpr. CXR negative , BNP 163. Pt was noted to be in Afib with RVR. Pt was admitted for Afib with rvr (with movement), and also dyspnea.    Hospital Course:  #1 acute exacerbation of diastolic CHF Patient was admitted with acute on chronic CHF exacerbation. Patient was also noted on admission to be in A. fib with RVR. Patient was  initially loaded with amiodarone. Patient was also placed on IV Lasix for diuresis. Patient was also seen in consultation by cardiology. Patient diuresed well on IV Lasix was subsequently transitioned to oral Lasix as well as spironolactone and metoprolol. Patient diuresed approximately 13.8 L with significant clinical improvement such that he was back to his baseline by day of discharge. Patient will follow-up with his cardiologist as outpatient.  #2 A. fib with RVR Patient was noted to be in A. fib with RVR on admission. Patient will was seen in consultation by cardiology who loaded patient with amiodarone in hopes of obtaining rhythm control. Patient was also placed on metoprolol for better rate control. 2-D echo was obtained with a EF of 50-55% with no wall motion abnormalities and a mildly dilated left atrium. Patient subsequently underwent electrical cardioversion and converted back into normal sinus rhythm. Patient tolerated procedure well. Patient was subsequently discharged on amiodarone in stable and improved condition and will follow-up with cardiology as outpatient.  #3 type 2 diabetes 28 years Patient was admitted placed U5 100 for diabetes control as he was having acute changes of hypo-and hyperglycemia.Dr [name] discuss with patient's endocrinologist, Dr [name] in patients U500 dose was adjusted per recommendations with better blood sugar control. Patient was discharged on U500 100units twice daily and will follow-up with his endocrinologist as outpatient.  #4 knee pain This was managed with oxycodone during the hospitalization. Patient had an appointment to see his pain medicine specialist which was 4-5 days post discharge and a such patient was discharged on a  5 day supply of opiates until he was able to see his pain doctor.  #5 dental issues and leukocytosis Patient was most of all his teeth extracted on 12/05/2014-at [facility] however due to patient's hospitalization patient will  need to be rescheduled. Patient was started on Augmentin 875 mg twice daily which will be discharged on until he follows up with his dentist.  #6 hyperlipidemia Patient was maintained on a statin.  #7 moderate severe obstructive sleep apnea Patient will need to follow-up with pulmonary as outpatient.  Procedures:  Electrical cardioversion per Dr.Croitoru 12/11/2014  2-D echo 12/05/2014  Consultations:  Cardiology  Discharge Exam: Filed Vitals:   12/11/14 1351  BP: 140/61  Pulse: 67  Temp: 97.5 F (36.4 C)  Resp:     General: NAD Cardiovascular: RRR Respiratory: CTAB  Discharge Instructions   Discharge Instructions    Diet - low sodium heart healthy    Complete by:  As directed      Discharge instructions    Complete by:  As directed   Follow up with pain clinic next week. Follow up with Dr [name] in 1 week. Follow up with Paul Bussing, MD in 1-2 weeks. Follow up with cardiology.     Increase activity slowly    Complete by:  As directed           Current Discharge Medication List    START taking these medications   Details  amiodarone (PACERONE) 200 MG tablet Take 1 tablet (200 mg total) by mouth 2 (two) times daily. Qty: 60 tablet, Refills: 0    amoxicillin-clavulanate (AUGMENTIN) 875-125 MG per tablet Take 1 tablet by mouth every 12 (twelve) hours. Qty: 28 tablet, Refills: 0      CONTINUE these medications which have CHANGED   Details  insulin regular human CONCENTRATED (HUMULIN R) 500 UNIT/ML SOLN injection Inject 0.2 mLs (100 Units total) into the skin 2 (two) times daily with a meal. Qty: 20 mL, Refills: 0    oxycodone (ROXICODONE) 30 MG immediate release tablet Take 1 tablet (30 mg total) by mouth 4 (four) times daily. Qty: 20 tablet, Refills: 0      CONTINUE these medications which have NOT CHANGED   Details  ACCU-CHEK AVIVA PLUS test strip Refills: 0    ACCU-CHEK FASTCLIX LANCETS MISC     BD INSULIN SYRINGE ULTRAFINE 31G X 15/64" 0.3 ML  MISC Refills: 0    Ferrous Sulfate (IRON) 325 (65 FE) MG TABS Take 1 tablet by mouth daily.    furosemide (LASIX) 80 MG tablet Take 1 tablet (80 mg total) by mouth 2 (two) times daily. Qty: 60 tablet, Refills: 5    gabapentin (NEURONTIN) 300 MG capsule Take 1,200 mg by mouth 2 (two) times daily.     Hypromellose (NATURAL BALANCE TEARS OP) Place 1 drop into both eyes daily as needed.    lisinopril (PRINIVIL,ZESTRIL) 10 MG tablet Take 1 tablet by mouth daily.    metoprolol tartrate (LOPRESSOR) 25 MG tablet Take 1 tablet (25 mg total) by mouth 2 (two) times daily. Qty: 60 tablet, Refills: 5    Multiple Vitamin (MULTIVITAMIN WITH MINERALS) TABS Take 1 tablet by mouth daily.    Podiatric Products (DIADERM FOOT REJUVENATING) CREA Apply 1 application topically daily as needed (dry skin).    potassium chloride SA (KLOR-CON M20) 20 MEQ tablet Take 1 tablet (20 mEq total) by mouth 2 (two) times daily. Qty: 60 tablet, Refills: 6    rosuvastatin (CRESTOR) 40 MG tablet Take 40  mg by mouth daily.    spironolactone (ALDACTONE) 25 MG tablet Take 1 tablet (25 mg total) by mouth daily. Qty: 30 tablet, Refills: 2    !! warfarin (COUMADIN) 5 MG tablet TAKE AS DIRECTED BY COUMADIN CLINIC Qty: 45 tablet, Refills: 2    !! warfarin (COUMADIN) 5 MG tablet Take 5-7.5 mg by mouth daily. 5 mg every day except Tuesday and Thursday patient takes 7.5 mg     !! - Potential duplicate medications found. Please discuss with provider.     No Known Allergies Follow-up Information    Follow up with HAGER, BRYAN, PA-C On 01/14/2015.   Specialties:  Physician Assistant, Radiology, Interventional Cardiology   Why:  2:30pm   Contact information:   [address] [place] [address] [phone]       Follow up with Paul Bussing, MD. Schedule an appointment as soon as possible for a visit in 1 week.   Specialty:  Family Medicine   Why:  f/u in 1-2 weeks   Contact information:   [address] [place] [address] [phone]       Follow up with KERR,JEFFREY, MD. Schedule an appointment as soon as possible for a visit in 1 week.   Specialty:  Endocrinology   Contact information:   [address] [place] [address] [phone]       Follow up with PHILLIPS,MARK, MD. Schedule an appointment as soon as possible for a visit in 1 week.   Specialty:  Anesthesiology   Contact information:   [address] [place] [address] [phone]        The results of significant diagnostics from this hospitalization (including imaging, microbiology, ancillary and laboratory) are listed below for reference.    Significant Diagnostic Studies: Dg Chest 2 View  12/04/2014   CLINICAL DATA:  Chronic shortness of breath with exertion, acute onset of cough approximately 2 days ago. Prior coronary artery stenting in 2010. Current history of hypertension, diabetes and atrial fibrillation.  EXAM: CHEST  2 VIEW  COMPARISON:  09/20/2014 and earlier.  FINDINGS: AP erect and lateral imaging was performed. Cardiac silhouette markedly enlarged but stable. Thoracic aorta atherosclerotic, unchanged. Hilar and mediastinal contours otherwise unremarkable. Mild pulmonary venous hypertension, unchanged, without evidence of pulmonary edema. Lungs clear. No pleural effusions. Degenerative changes involving the thoracic spine. No significant interval change dating back to July, 2013.  IMPRESSION: Stable cardiomegaly.  No acute cardiopulmonary disease.   Electronically Signed   By: Hulan Saas M.D.   On: 12/04/2014 19:24   US Scrotum  11/27/2014   CLINICAL DATA:  Groin swelling. Fall yesterday, now with testicular pain and swelling.  EXAM: SCROTAL ULTRASOUND  DOPPLER ULTRASOUND OF THE TESTICLES  TECHNIQUE: Complete ultrasound examination of the testicles, epididymis, and other scrotal structures was performed. Color and spectral Doppler ultrasound were also  utilized to evaluate blood flow to the testicles.  COMPARISON:  None.  FINDINGS: Right testicle  Measurements: 3.5 x 2.1 x 2.1 cm. No mass or microlithiasis visualized. No testicular hematoma. Homogeneous blood flow noted.  Left testicle  Measurements: 3.8 x 1.9 x 2.4 cm. No mass or microlithiasis visualized. No testicular hematoma. Homogeneous blood flow noted.  Right epididymis:  Normal where visualized, tail obscured.  Left epididymis:  Not well seen.  Hydrocele:  None visualized.  Varicocele:  None visualized.  Scrotal skin: Diffuse skin and subcutaneous edema is seen. No fluid collection. There is fat within both  inguinal canals.  Pulsed Doppler interrogation of both testes demonstrates normal low resistance arterial and venous waveforms bilaterally.  IMPRESSION: 1. Diffuse skin and subcutaneous edema of the scrotum. No focal fluid collection. 2. Normal appearance of both testis.  Normal blood flow.   Electronically Signed   By: Rubye Oaks M.D.   On: 11/27/2014 03:01   [place] Art/ven Flow Abd Pelv Doppler  11/27/2014   CLINICAL DATA:  Groin swelling. Fall yesterday, now with testicular pain and swelling.  EXAM: SCROTAL ULTRASOUND  DOPPLER ULTRASOUND OF THE TESTICLES  TECHNIQUE: Complete ultrasound examination of the testicles, epididymis, and other scrotal structures was performed. Color and spectral Doppler ultrasound were also utilized to evaluate blood flow to the testicles.  COMPARISON:  None.  FINDINGS: Right testicle  Measurements: 3.5 x 2.1 x 2.1 cm. No mass or microlithiasis visualized. No testicular hematoma. Homogeneous blood flow noted.  Left testicle  Measurements: 3.8 x 1.9 x 2.4 cm. No mass or microlithiasis visualized. No testicular hematoma. Homogeneous blood flow noted.  Right epididymis:  Normal where visualized, tail obscured.  Left epididymis:  Not well seen.  Hydrocele:  None visualized.  Varicocele:  None visualized.  Scrotal skin: Diffuse skin and subcutaneous edema is seen. No fluid  collection. There is fat within both inguinal canals.  Pulsed Doppler interrogation of both testes demonstrates normal low resistance arterial and venous waveforms bilaterally.  IMPRESSION: 1. Diffuse skin and subcutaneous edema of the scrotum. No focal fluid collection. 2. Normal appearance of both testis.  Normal blood flow.   Electronically Signed   By: Rubye Oaks M.D.   On: 11/27/2014 03:01   Dg Knee Complete 4 Views Left  11/27/2014   CLINICAL DATA:  Status post fall, with left knee pain. Initial encounter.  EXAM: LEFT KNEE - COMPLETE 4+ VIEW  COMPARISON:  None.  FINDINGS: There is no evidence of fracture or dislocation. The joint spaces are preserved. Chondrocalcinosis is noted; the patellofemoral joint is grossly unremarkable in appearance. A fabella is seen.  No significant joint effusion is seen. The visualized soft tissues are normal in appearance.  IMPRESSION: 1. No evidence of fracture or dislocation. 2. Chondrocalcinosis noted.   Electronically Signed   By: Roanna Raider M.D.   On: 11/27/2014 03:05   Dg Knee Complete 4 Views Right  11/27/2014   CLINICAL DATA:  Status post fall; right knee pain. Initial encounter.  EXAM: RIGHT KNEE - COMPLETE 4+ VIEW  COMPARISON:  None.  FINDINGS: There is no evidence of fracture or dislocation. The joint spaces are preserved. There is narrowing of the patellofemoral compartment, with marginal osteophytes seen arising at all three compartments. Wall osteophytes are also noted. Underlying mild chondrocalcinosis seen. A fabella is noted.  No significant joint effusion is seen. Mild suprapatellar soft tissue swelling is suggested.  IMPRESSION: 1. No evidence of fracture or dislocation. 2. 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 3. Underlying mild chondrocalcinosis noted.   Electronically Signed   By: Roanna Raider M.D.   On: 11/27/2014 03:07    Microbiology: No results found for this or any previous visit  (from the past 240 hour(s)).   Labs: Basic Metabolic Panel:  Recent Labs Lab 12/07/14 0345 12/08/14 0607 12/09/14 0500 12/10/14 0335 12/11/14 0550 12/11/14 0838  NA 129* 135 130* 128*  --  134*  K 4.0 4.2 4.4 4.3  --  4.3  CL 91* 98* 90* 91*  --  92*  CO2 24 26 28 28   --  27  GLUCOSE 427* 104* 259* 423*  --  216*  BUN 24* 22* 25* 25*  --  25*  CREATININE 1.73* 1.48* 1.79* 1.69* 1.88* 1.82*  CALCIUM 8.4* 9.1 9.2 8.7*  --  9.5  MG  --  2.2  --   --   --   --    Liver Function Tests:  Recent Labs Lab 12/05/14 0430  AST 20  ALT 15*  ALKPHOS 63  BILITOT 0.7  PROT 7.5  ALBUMIN 3.4*   No results for input(s): LIPASE, AMYLASE in the last 168 hours. No results for input(s): AMMONIA in the last 168 hours. CBC:  Recent Labs Lab 12/04/14 1756 12/05/14 0430 12/06/14 0517 12/07/14 0345 12/08/14 0607 12/09/14 0500  WBC 14.1* 11.8* 13.6* 12.2* 13.9* 13.7*  NEUTROABS 10.2*  --   --   --   --   --   HGB 11.6* 11.2* 11.0* 11.5* 12.0* 12.6*  HCT 36.9* 36.4* 35.4* 36.8* 37.8* 38.3*  MCV 74.2* 74.7* 73.8* 73.2* 72.8* 72.0*  PLT 341 338 346 329 344 362   Cardiac Enzymes:  Recent Labs Lab 12/05/14 12/05/14 0430 12/05/14 1031  TROPONINI <0.03 0.03 <0.03   BNP: BNP (last 3 results)  Recent Labs  09/19/14 1938 12/04/14 1756  BNP 153.2* 163.7*    ProBNP (last 3 results) No results for input(s): PROBNP in the last 8760 hours.  CBG:  Recent Labs Lab 12/10/14 1556 12/10/14 2120 12/11/14 0332 12/11/14 0616 12/11/14 1147  GLUCAP 466* 355* 176* 167* 280*       Signed:  THOMPSON,DANIEL MD [name] 12/11/2014, 2:41 PM

## 2014-12-12 ENCOUNTER — Encounter (HOSPITAL_COMMUNITY): Payer: Self-pay | Admitting: Cardiovascular Disease

## 2015-01-14 ENCOUNTER — Ambulatory Visit: Payer: Medicare Other | Admitting: Physician Assistant

## 2015-01-14 ENCOUNTER — Ambulatory Visit: Payer: Medicare Other

## 2015-01-22 ENCOUNTER — Ambulatory Visit (INDEPENDENT_AMBULATORY_CARE_PROVIDER_SITE_OTHER): Payer: Medicare Other | Admitting: *Deleted

## 2015-01-22 DIAGNOSIS — I4891 Unspecified atrial fibrillation: Secondary | ICD-10-CM | POA: Diagnosis not present

## 2015-01-22 DIAGNOSIS — Z5181 Encounter for therapeutic drug level monitoring: Secondary | ICD-10-CM

## 2015-01-22 LAB — POCT INR: INR: 3.9

## 2015-01-27 ENCOUNTER — Ambulatory Visit: Payer: Medicare Other | Admitting: Podiatry

## 2015-01-30 ENCOUNTER — Other Ambulatory Visit: Payer: Self-pay | Admitting: Cardiology

## 2015-01-30 ENCOUNTER — Other Ambulatory Visit: Payer: Self-pay | Admitting: *Deleted

## 2015-01-30 NOTE — Telephone Encounter (Signed)
COUMADIN REFILL. THANK YOU FOR YOUR TIME.  

## 2015-01-31 ENCOUNTER — Other Ambulatory Visit: Payer: Self-pay

## 2015-01-31 MED ORDER — SPIRONOLACTONE 25 MG PO TABS: 25.0000 mg | ORAL_TABLET | Freq: Every day | ORAL | Status: DC

## 2015-02-11 ENCOUNTER — Ambulatory Visit: Payer: Medicare Other | Admitting: Physician Assistant

## 2015-02-11 ENCOUNTER — Ambulatory Visit: Payer: Medicare Other

## 2015-02-17 ENCOUNTER — Telehealth: Payer: Self-pay | Admitting: Cardiology

## 2015-02-17 MED ORDER — SPIRONOLACTONE 25 MG PO TABS: 25.0000 mg | ORAL_TABLET | Freq: Every day | ORAL | Status: DC

## 2015-02-17 NOTE — Telephone Encounter (Signed)
Patient states his pharmacy does not have a prescription for his Spironolactone 25 mg 1 daily --please call [facility] on [address].

## 2015-02-17 NOTE — Telephone Encounter (Signed)
Spoke with pt, script sent to pharmacy

## 2015-02-19 ENCOUNTER — Ambulatory Visit: Payer: Medicare Other | Admitting: Podiatry

## 2015-03-06 ENCOUNTER — Other Ambulatory Visit: Payer: Self-pay | Admitting: *Deleted

## 2015-03-06 MED ORDER — AMIODARONE HCL 200 MG PO TABS: 200.0000 mg | ORAL_TABLET | Freq: Two times a day (BID) | ORAL | Status: DC

## 2015-03-12 ENCOUNTER — Ambulatory Visit: Payer: Medicare Other | Admitting: Pharmacist Clinician (PhC)/ Clinical Pharmacy Specialist

## 2015-03-12 ENCOUNTER — Ambulatory Visit: Payer: Medicare Other | Admitting: Physician Assistant

## 2015-03-20 ENCOUNTER — Encounter: Payer: Self-pay | Admitting: Physician Assistant

## 2015-04-07 ENCOUNTER — Telehealth: Payer: Self-pay | Admitting: Physician Assistant

## 2015-04-07 ENCOUNTER — Encounter: Payer: Self-pay | Admitting: Physician Assistant

## 2015-04-08 ENCOUNTER — Telehealth: Payer: Self-pay | Admitting: *Deleted

## 2015-04-08 ENCOUNTER — Other Ambulatory Visit: Payer: Self-pay | Admitting: Cardiology

## 2015-04-08 NOTE — Telephone Encounter (Signed)
Close encounter 

## 2015-04-08 NOTE — Telephone Encounter (Signed)
Spoke with pt and informed that we had a refill request for his coumadin and he has not been seen since July 13th ,2016 . Appt made for coumadin clinic appt for September 29th to have his INR checked and will order coumadin refill at that time and pt states understanding.Also he states does have enough coumadin until seen

## 2015-04-09 ENCOUNTER — Other Ambulatory Visit: Payer: Self-pay

## 2015-04-09 MED ORDER — FUROSEMIDE 80 MG PO TABS: 80.0000 mg | ORAL_TABLET | Freq: Two times a day (BID) | ORAL | Status: DC

## 2015-04-09 MED ORDER — AMIODARONE HCL 200 MG PO TABS: 200.0000 mg | ORAL_TABLET | Freq: Two times a day (BID) | ORAL | Status: DC

## 2015-04-09 MED ORDER — METOPROLOL TARTRATE 25 MG PO TABS: 25.0000 mg | ORAL_TABLET | Freq: Two times a day (BID) | ORAL | Status: DC

## 2015-04-09 MED ORDER — SPIRONOLACTONE 25 MG PO TABS: 25.0000 mg | ORAL_TABLET | Freq: Every day | ORAL | Status: DC

## 2015-04-09 MED ORDER — LISINOPRIL 10 MG PO TABS: 10.0000 mg | ORAL_TABLET | Freq: Every day | ORAL | Status: DC

## 2015-04-09 NOTE — Telephone Encounter (Signed)
Received paper refill for the following medications:  Warfarin  Amiodarone  Lisinopril  Gabapentin  Metoprolol tartrate  Humulin  Furosemide  Spironolactone  Rx(s) sent to pharmacy electronically for Amiodarone, Lisinopril, Metoprolol tartrate, Furosemide, and Spironolactone. Refill for Warfarin routed to Phillips Hay, PharmD, for approval.  Rx(s) for Gabapentin and Humulin have been refused. Patient has a PCP. Refills for these medications should be deferred to patient's PCP. Dr [name] will NOT fill non-cardiac medications.

## 2015-04-10 ENCOUNTER — Ambulatory Visit (INDEPENDENT_AMBULATORY_CARE_PROVIDER_SITE_OTHER): Payer: Medicare Other | Admitting: *Deleted

## 2015-04-10 DIAGNOSIS — I4891 Unspecified atrial fibrillation: Secondary | ICD-10-CM

## 2015-04-10 DIAGNOSIS — Z5181 Encounter for therapeutic drug level monitoring: Secondary | ICD-10-CM

## 2015-04-10 LAB — POCT INR: INR: 8

## 2015-04-10 LAB — PROTIME-INR
INR: 5.73 — AB (ref <1.50)
Prothrombin Time: 53.7 seconds — ABNORMAL HIGH (ref 11.6–15.2)

## 2015-04-10 MED ORDER — WARFARIN SODIUM 5 MG PO TABS
ORAL_TABLET | ORAL | Status: DC
Start: 2015-04-10 — End: 2015-04-11

## 2015-04-10 NOTE — Telephone Encounter (Signed)
Will let you refill this once he makes an appearance!

## 2015-04-11 ENCOUNTER — Other Ambulatory Visit: Payer: Self-pay | Admitting: Pharmacist Clinician (PhC)/ Clinical Pharmacy Specialist

## 2015-04-11 MED ORDER — WARFARIN SODIUM 5 MG PO TABS: ORAL_TABLET | ORAL | Status: DC

## 2015-04-14 ENCOUNTER — Ambulatory Visit: Payer: Medicare Other | Admitting: Physician Assistant

## 2015-04-15 ENCOUNTER — Ambulatory Visit: Payer: Medicare Other | Admitting: Podiatry

## 2015-04-16 ENCOUNTER — Ambulatory Visit: Payer: Medicare Other | Admitting: Physician Assistant

## 2015-04-16 ENCOUNTER — Ambulatory Visit: Payer: Medicare Other | Admitting: Pharmacist Clinician (PhC)/ Clinical Pharmacy Specialist

## 2015-05-03 ENCOUNTER — Other Ambulatory Visit: Payer: Self-pay | Admitting: Cardiology

## 2015-05-14 ENCOUNTER — Ambulatory Visit: Payer: Medicare Other | Admitting: Physician Assistant

## 2015-06-10 ENCOUNTER — Other Ambulatory Visit: Payer: Self-pay | Admitting: Cardiology

## 2015-07-08 ENCOUNTER — Emergency Department (HOSPITAL_COMMUNITY): Payer: Medicare Other

## 2015-07-08 ENCOUNTER — Other Ambulatory Visit: Payer: Self-pay

## 2015-07-08 ENCOUNTER — Encounter (HOSPITAL_COMMUNITY): Payer: Self-pay | Admitting: *Deleted

## 2015-07-08 ENCOUNTER — Inpatient Hospital Stay (HOSPITAL_COMMUNITY)
Admission: EM | Admit: 2015-07-08 | Discharge: 2015-07-10 | DRG: 309 | Disposition: A | Discharge status: Home / Self Care | Payer: Medicare Other | Attending: Internal Medicine | Admitting: Internal Medicine

## 2015-07-08 DIAGNOSIS — Z9842 Cataract extraction status, left eye: Secondary | ICD-10-CM | POA: Diagnosis not present

## 2015-07-08 DIAGNOSIS — I480 Paroxysmal atrial fibrillation: Secondary | ICD-10-CM | POA: Diagnosis present

## 2015-07-08 DIAGNOSIS — E785 Hyperlipidemia, unspecified: Secondary | ICD-10-CM | POA: Diagnosis present

## 2015-07-08 DIAGNOSIS — Z9119 Patient's noncompliance with other medical treatment and regimen: Secondary | ICD-10-CM

## 2015-07-08 DIAGNOSIS — Z9841 Cataract extraction status, right eye: Secondary | ICD-10-CM

## 2015-07-08 DIAGNOSIS — E1122 Type 2 diabetes mellitus with diabetic chronic kidney disease: Secondary | ICD-10-CM | POA: Diagnosis present

## 2015-07-08 DIAGNOSIS — Z6841 Body Mass Index (BMI) 40.0 and over, adult: Secondary | ICD-10-CM | POA: Diagnosis not present

## 2015-07-08 DIAGNOSIS — Z961 Presence of intraocular lens: Secondary | ICD-10-CM | POA: Diagnosis present

## 2015-07-08 DIAGNOSIS — Z79899 Other long term (current) drug therapy: Secondary | ICD-10-CM | POA: Diagnosis not present

## 2015-07-08 DIAGNOSIS — R000 Tachycardia, unspecified: Secondary | ICD-10-CM | POA: Diagnosis present

## 2015-07-08 DIAGNOSIS — R0789 Other chest pain: Secondary | ICD-10-CM | POA: Diagnosis present

## 2015-07-08 DIAGNOSIS — N183 Chronic kidney disease, stage 3 unspecified: Secondary | ICD-10-CM | POA: Diagnosis present

## 2015-07-08 DIAGNOSIS — E1165 Type 2 diabetes mellitus with hyperglycemia: Secondary | ICD-10-CM | POA: Diagnosis present

## 2015-07-08 DIAGNOSIS — Z794 Long term (current) use of insulin: Secondary | ICD-10-CM

## 2015-07-08 DIAGNOSIS — I252 Old myocardial infarction: Secondary | ICD-10-CM | POA: Diagnosis not present

## 2015-07-08 DIAGNOSIS — I4891 Unspecified atrial fibrillation: Secondary | ICD-10-CM | POA: Diagnosis present

## 2015-07-08 DIAGNOSIS — G8929 Other chronic pain: Secondary | ICD-10-CM | POA: Diagnosis present

## 2015-07-08 DIAGNOSIS — E781 Pure hyperglyceridemia: Secondary | ICD-10-CM | POA: Diagnosis present

## 2015-07-08 DIAGNOSIS — G4733 Obstructive sleep apnea (adult) (pediatric): Secondary | ICD-10-CM | POA: Diagnosis present

## 2015-07-08 DIAGNOSIS — W19XXXA Unspecified fall, initial encounter: Secondary | ICD-10-CM | POA: Diagnosis present

## 2015-07-08 DIAGNOSIS — I5032 Chronic diastolic (congestive) heart failure: Secondary | ICD-10-CM | POA: Diagnosis present

## 2015-07-08 DIAGNOSIS — Z87891 Personal history of nicotine dependence: Secondary | ICD-10-CM

## 2015-07-08 DIAGNOSIS — Z7901 Long term (current) use of anticoagulants: Secondary | ICD-10-CM

## 2015-07-08 DIAGNOSIS — Z9861 Coronary angioplasty status: Secondary | ICD-10-CM

## 2015-07-08 DIAGNOSIS — M545 Low back pain: Secondary | ICD-10-CM | POA: Diagnosis present

## 2015-07-08 DIAGNOSIS — Z955 Presence of coronary angioplasty implant and graft: Secondary | ICD-10-CM

## 2015-07-08 DIAGNOSIS — Z9889 Other specified postprocedural states: Secondary | ICD-10-CM | POA: Diagnosis not present

## 2015-07-08 DIAGNOSIS — I10 Essential (primary) hypertension: Secondary | ICD-10-CM | POA: Diagnosis present

## 2015-07-08 DIAGNOSIS — I129 Hypertensive chronic kidney disease with stage 1 through stage 4 chronic kidney disease, or unspecified chronic kidney disease: Secondary | ICD-10-CM | POA: Diagnosis present

## 2015-07-08 DIAGNOSIS — I2510 Atherosclerotic heart disease of native coronary artery without angina pectoris: Secondary | ICD-10-CM | POA: Diagnosis present

## 2015-07-08 DIAGNOSIS — Z9049 Acquired absence of other specified parts of digestive tract: Secondary | ICD-10-CM | POA: Diagnosis not present

## 2015-07-08 LAB — BRAIN NATRIURETIC PEPTIDE: B NATRIURETIC PEPTIDE 5: 78.4 pg/mL (ref 0.0–100.0)

## 2015-07-08 LAB — I-STAT TROPONIN, ED
TROPONIN I, POC: 0.01 ng/mL (ref 0.00–0.08)
TROPONIN I, POC: 0.03 ng/mL (ref 0.00–0.08)

## 2015-07-08 LAB — BASIC METABOLIC PANEL
ANION GAP: 13 (ref 5–15)
BUN: 12 mg/dL (ref 6–20)
CALCIUM: 9 mg/dL (ref 8.9–10.3)
CHLORIDE: 102 mmol/L (ref 101–111)
CO2: 23 mmol/L (ref 22–32)
Creatinine, Ser: 1.22 mg/dL (ref 0.61–1.24)
GFR calc non Af Amer: >60 mL/min (ref >60)
GLUCOSE: 183 mg/dL — AB (ref 65–99)
POTASSIUM: 3.7 mmol/L (ref 3.5–5.1)
Sodium: 138 mmol/L (ref 135–145)

## 2015-07-08 LAB — PROTIME-INR
INR: 2.45 — AB (ref 0.00–1.49)
Prothrombin Time: 26.2 seconds — ABNORMAL HIGH (ref 11.6–15.2)

## 2015-07-08 LAB — CBC
HEMATOCRIT: 40.3 % (ref 39.0–52.0)
HEMOGLOBIN: 12.8 g/dL — AB (ref 13.0–17.0)
MCH: 25.1 pg — ABNORMAL LOW (ref 26.0–34.0)
MCHC: 31.8 g/dL (ref 30.0–36.0)
MCV: 79.2 fL (ref 78.0–100.0)
Platelets: 284 10*3/uL (ref 150–400)
RBC: 5.09 MIL/uL (ref 4.22–5.81)
RDW: 16.2 % — ABNORMAL HIGH (ref 11.5–15.5)
WBC: 8.8 10*3/uL (ref 4.0–10.5)

## 2015-07-08 LAB — APTT: aPTT: 39 seconds — ABNORMAL HIGH (ref 24–37)

## 2015-07-08 LAB — TROPONIN I: TROPONIN I: 0.06 ng/mL — AB (ref <0.031)

## 2015-07-08 MED ORDER — GABAPENTIN 400 MG PO CAPS: 1200.0000 mg | ORAL_CAPSULE | Freq: Two times a day (BID) | ORAL | Status: DC

## 2015-07-08 MED ORDER — AMLODIPINE BESYLATE 5 MG PO TABS: 5.0000 mg | ORAL_TABLET | Freq: Every day | ORAL | Status: DC

## 2015-07-08 MED ORDER — FUROSEMIDE 80 MG PO TABS
80.0000 mg | ORAL_TABLET | Freq: Two times a day (BID) | ORAL | Status: DC
  Administered 2015-07-09 – 2015-07-10 (×3): 80 mg via ORAL
  Filled 2015-07-08 (×4): qty 1

## 2015-07-08 MED ORDER — INSULIN ASPART 100 UNIT/ML ~~LOC~~ SOLN: 80.0000 [IU] | Freq: Two times a day (BID) | SUBCUTANEOUS | Status: DC

## 2015-07-08 MED ORDER — OXYCODONE-ACETAMINOPHEN 5-325 MG PO TABS
2.0000 | ORAL_TABLET | Freq: Once | ORAL | Status: AC
  Administered 2015-07-08: 2 via ORAL
  Filled 2015-07-08: qty 2

## 2015-07-08 MED ORDER — WARFARIN - PHARMACIST DOSING INPATIENT: Freq: Every day | Status: DC

## 2015-07-08 MED ORDER — DILTIAZEM HCL 100 MG IV SOLR: 5.0000 mg/h | Freq: Once | INTRAVENOUS | Status: DC

## 2015-07-08 MED ORDER — MORPHINE SULFATE (PF) 2 MG/ML IV SOLN
2.0000 mg | INTRAVENOUS | Status: DC | PRN
  Filled 2015-07-08: qty 1

## 2015-07-08 MED ORDER — LISINOPRIL 10 MG PO TABS
10.0000 mg | ORAL_TABLET | Freq: Every day | ORAL | Status: DC
  Administered 2015-07-09 – 2015-07-10 (×3): 10 mg via ORAL
  Filled 2015-07-08 (×3): qty 1

## 2015-07-08 MED ORDER — AMIODARONE HCL 200 MG PO TABS
200.0000 mg | ORAL_TABLET | Freq: Two times a day (BID) | ORAL | Status: DC
  Administered 2015-07-09 (×2): 200 mg via ORAL
  Filled 2015-07-08 (×2): qty 1

## 2015-07-08 MED ORDER — DEXTROSE 5 % IV SOLN
5.0000 mg/h | Freq: Once | INTRAVENOUS | Status: AC
  Administered 2015-07-08: 5 mg/h via INTRAVENOUS
  Filled 2015-07-08: qty 100

## 2015-07-08 MED ORDER — WARFARIN SODIUM 5 MG PO TABS: 5.0000 mg | ORAL_TABLET | Freq: Every day | ORAL | Status: DC

## 2015-07-08 MED ORDER — WARFARIN SODIUM 5 MG PO TABS
5.0000 mg | ORAL_TABLET | Freq: Once | ORAL | Status: AC
  Administered 2015-07-08: 5 mg via ORAL
  Filled 2015-07-08: qty 1

## 2015-07-08 MED ORDER — MORPHINE SULFATE (PF) 4 MG/ML IV SOLN
4.0000 mg | Freq: Once | INTRAVENOUS | Status: AC
  Administered 2015-07-08: 4 mg via INTRAMUSCULAR

## 2015-07-08 MED ORDER — AMLODIPINE BESYLATE 5 MG PO TABS
5.0000 mg | ORAL_TABLET | Freq: Every day | ORAL | Status: DC
  Administered 2015-07-09: 5 mg via ORAL
  Filled 2015-07-08: qty 1

## 2015-07-08 MED ORDER — MORPHINE SULFATE (PF) 4 MG/ML IV SOLN
4.0000 mg | Freq: Once | INTRAVENOUS | Status: DC
  Filled 2015-07-08: qty 1

## 2015-07-08 MED ORDER — INSULIN REGULAR HUMAN (CONC) 500 UNIT/ML ~~LOC~~ SOLN: 100.0000 [IU] | Freq: Two times a day (BID) | SUBCUTANEOUS | Status: DC

## 2015-07-08 MED ORDER — DIADERM FOOT REJUVENATING EX CREA: 1.0000 "application " | TOPICAL_CREAM | Freq: Every day | CUTANEOUS | Status: DC | PRN

## 2015-07-08 MED ORDER — OXYCODONE-ACETAMINOPHEN 5-325 MG PO TABS
2.0000 | ORAL_TABLET | ORAL | Status: DC | PRN
  Administered 2015-07-09 – 2015-07-10 (×3): 2 via ORAL
  Filled 2015-07-08 (×3): qty 2

## 2015-07-08 MED ORDER — ACETAMINOPHEN 325 MG PO TABS
650.0000 mg | ORAL_TABLET | Freq: Four times a day (QID) | ORAL | Status: DC | PRN
Start: 2015-07-08 — End: 2015-07-10

## 2015-07-08 MED ORDER — POTASSIUM CHLORIDE CRYS ER 20 MEQ PO TBCR
20.0000 meq | EXTENDED_RELEASE_TABLET | Freq: Every day | ORAL | Status: DC
  Administered 2015-07-09 – 2015-07-10 (×3): 20 meq via ORAL
  Filled 2015-07-08 (×3): qty 1

## 2015-07-08 MED ORDER — METOPROLOL TARTRATE 25 MG PO TABS
25.0000 mg | ORAL_TABLET | Freq: Two times a day (BID) | ORAL | Status: DC
  Administered 2015-07-08 – 2015-07-10 (×4): 25 mg via ORAL
  Filled 2015-07-08 (×5): qty 1

## 2015-07-08 MED ORDER — HYPROMELLOSE 0.4 % OP SOLN: 1.0000 [drp] | Freq: Every day | OPHTHALMIC | Status: DC | PRN

## 2015-07-08 MED ORDER — ROSUVASTATIN CALCIUM 40 MG PO TABS
40.0000 mg | ORAL_TABLET | Freq: Every day | ORAL | Status: DC
  Administered 2015-07-09 (×2): 40 mg via ORAL
  Filled 2015-07-08: qty 1
  Filled 2015-07-08: qty 2
  Filled 2015-07-08: qty 1
  Filled 2015-07-08: qty 2
  Filled 2015-07-08: qty 1

## 2015-07-08 MED ORDER — SPIRONOLACTONE 25 MG PO TABS
25.0000 mg | ORAL_TABLET | Freq: Every day | ORAL | Status: DC
  Administered 2015-07-09 – 2015-07-10 (×2): 25 mg via ORAL
  Filled 2015-07-08 (×2): qty 1

## 2015-07-08 MED ORDER — FERROUS SULFATE 325 (65 FE) MG PO TABS
325.0000 mg | ORAL_TABLET | Freq: Every day | ORAL | Status: DC
  Administered 2015-07-09 – 2015-07-10 (×2): 325 mg via ORAL
  Filled 2015-07-08 (×2): qty 1

## 2015-07-08 MED ORDER — SODIUM CHLORIDE 0.9 % IJ SOLN
3.0000 mL | Freq: Two times a day (BID) | INTRAMUSCULAR | Status: DC
  Administered 2015-07-09 – 2015-07-10 (×4): 3 mL via INTRAVENOUS

## 2015-07-08 MED ORDER — ALUM & MAG HYDROXIDE-SIMETH 200-200-20 MG/5ML PO SUSP: 30.0000 mL | Freq: Four times a day (QID) | ORAL | Status: DC | PRN

## 2015-07-08 MED ORDER — ADULT MULTIVITAMIN W/MINERALS CH
1.0000 | ORAL_TABLET | Freq: Every day | ORAL | Status: DC
  Administered 2015-07-09 – 2015-07-10 (×3): 1 via ORAL
  Filled 2015-07-08 (×3): qty 1

## 2015-07-08 MED ORDER — IRON 325 (65 FE) MG PO TABS: 1.0000 | ORAL_TABLET | Freq: Every day | ORAL | Status: DC

## 2015-07-08 MED ORDER — INSULIN REGULAR HUMAN (CONC) 500 UNIT/ML ~~LOC~~ SOLN: 90.0000 [IU] | Freq: Two times a day (BID) | SUBCUTANEOUS | Status: DC

## 2015-07-08 MED ORDER — GABAPENTIN 600 MG PO TABS
1200.0000 mg | ORAL_TABLET | Freq: Two times a day (BID) | ORAL | Status: DC
  Administered 2015-07-09 – 2015-07-10 (×4): 1200 mg via ORAL
  Filled 2015-07-08 (×4): qty 2

## 2015-07-08 MED ORDER — POLYVINYL ALCOHOL 1.4 % OP SOLN
1.0000 [drp] | OPHTHALMIC | Status: DC | PRN
  Filled 2015-07-08: qty 15

## 2015-07-08 MED ORDER — ACETAMINOPHEN 650 MG RE SUPP: 650.0000 mg | Freq: Four times a day (QID) | RECTAL | Status: DC | PRN

## 2015-07-08 MED ORDER — OXYCODONE HCL 5 MG PO TABS
30.0000 mg | ORAL_TABLET | Freq: Four times a day (QID) | ORAL | Status: DC
  Administered 2015-07-08 – 2015-07-10 (×6): 30 mg via ORAL
  Filled 2015-07-08 (×7): qty 6

## 2015-07-08 NOTE — ED Provider Notes (Signed)
CSN: [id number]     Arrival date & time 07/08/15  1356 History   First MD Initiated Contact with Patient 07/08/15 1603     Chief Complaint  Patient presents with  . Chest Pain     (Consider location/radiation/quality/duration/timing/severity/associated sxs/prior Treatment) HPI Patient states he was in his baseline when he was putting up Christmas will commence yesterday. He twisted and heard a "pop" in his left chest. He had immediate pain. This pain has been progressive. It is worse with movement, palpation and deep breathing. He denies any new cough, fever or chills. He has no new lower extremity swelling or pain. Denies any palpitations. Has taken an oxycodone at home with some mild relief. Patient is on Coumadin but has not been compliant following up in Coumadin clinic or with his cardiologist. Past Medical History  Diagnosis Date  . Hypertension   . Chronic diastolic CHF (congestive heart failure) (HCC)   . CAD (coronary artery disease)     a. s/p PCI to RCA in 2012. b. prior cath in 01/2014 with elevated L/RH pressures, mild-mod CAD of LAD/LCx with patent RCA, normal EF, c/b CIN/CHF.  [name] PAF (paroxysmal atrial fibrillation) (HCC)     TEE DCCV 09/23/2014  . Anemia   . Pilonidal cyst 1980's; 01/25/2013  . Shortness of breath     with ambulation, with exertion  . Neuropathy (HCC)   . History of blood transfusion ~ 1954    "related to OR"  . Carpal tunnel syndrome, bilateral   . Morbid obesity (HCC)   . Myocardial infarction [facility])     pt stated he had a slient heart attack that showed up on nuclear stress.  . Hyperlipidemia   . OSA (obstructive sleep apnea)     "I wear nasal prongs; haven't been using prongs recently" (09/19/2014)  . Type II diabetes mellitus (HCC)   . Arthritis     "hands and lower back" (09/19/2014)  . Chronic lower back pain   . Chronic kidney disease (CKD), stage III (moderate)   . Cellulitis   . Hypoxia     a. Qualified for home O2 at [place] in 09/2014.  [name]  Physical deconditioning    Past Surgical History  Procedure Laterality Date  . Abdominal surgery  ~ 1954    BENIGN TUMOR REMOVED  . Debridement  foot Left     debriding diabetic foot ulcers  . Cataract extraction w/phaco Right 11/15/2012    Procedure: CATARACT EXTRACTION PHACO AND INTRAOCULAR LENS PLACEMENT (IOC);  Surgeon: Shade Flood, MD;  Location: [facility] OR;  Service: Ophthalmology;  Laterality: Right;  . Cataract extraction w/phaco Left 11/29/2012    Procedure: CATARACT EXTRACTION PHACO AND INTRAOCULAR LENS PLACEMENT (IOC);  Surgeon: Shade Flood, MD;  Location: [facility] OR;  Service: Ophthalmology;  Laterality: Left;  . Pilonidal cyst excision N/A 01/08/2013    Procedure: CYST EXCISION PILONIDAL EXTENSIVE;  Surgeon: Axel Filler, MD;  Location: [facility] OR;  Service: General;  Laterality: N/A;  . Foreign body removal Right 2014    heel,  splinter removal   . Pars plana vitrectomy Left 06/05/2013    Procedure: PARS PLANA VITRECTOMY WITH 23 GAUGE with Endolaser(constellation);  Surgeon: Shade Flood, MD;  Location: [facility] OR;  Service: Ophthalmology;  Laterality: Left;  . Left and right heart catheterization with coronary angiogram N/A 01/31/2014    Procedure: LEFT AND RIGHT HEART CATHETERIZATION WITH CORONARY ANGIOGRAM;  Surgeon: Micheline Chapman, MD;  Location: [facility] CATH LAB;  Service: Cardiovascular;  Laterality: N/A;  .  Tonsillectomy    . Appendectomy    . Eye surgery    . Coronary angioplasty with stent placement  August 2012    RCA DES - [facility]  . Cardiac catheterization  01/2014  . Cardioversion  2010    [name] 09/19/2014  . Pilonidal cyst excision  1980's    "in [place]"  . Tee without cardioversion N/A 09/23/2014    Procedure: TRANSESOPHAGEAL ECHOCARDIOGRAM (TEE);  Surgeon: Lewayne Bunting, MD;  Location: [facility] ENDOSCOPY;  Service: Cardiovascular;  Laterality: N/A;  . Cardioversion N/A 09/23/2014    Procedure: CARDIOVERSION;  Surgeon: Lewayne Bunting, MD;  Location:  [facility] ENDOSCOPY;  Service: Cardiovascular;  Laterality: N/A;  . Cardioversion N/A 12/11/2014    Procedure: CARDIOVERSION;  Surgeon: Thurmon Fair, MD;  Location: [facility] ENDOSCOPY;  Service: Cardiovascular;  Laterality: N/A;   Family History  Problem Relation Age of Onset  . Adopted: Yes  . Other Other     PT ADOPTED   Social History  Substance Use Topics  . Smoking status: Former Smoker -- 1.00 packs/day for 20 years    Types: Cigarettes    Quit date: 07/13/1983  . Smokeless tobacco: Never Used  . Alcohol Use: No    Review of Systems  Constitutional: Negative for fever and chills.  Respiratory: Negative for cough and shortness of breath.   Cardiovascular: Positive for chest pain. Negative for palpitations and leg swelling.  Gastrointestinal: Negative for nausea, vomiting and abdominal pain.  Musculoskeletal: Negative for back pain, neck pain and neck stiffness.  Skin: Negative for rash and wound.  Neurological: Negative for dizziness, weakness, light-headedness, numbness and headaches.  All other systems reviewed and are negative.     Allergies  Review of patient's allergies indicates no known allergies.  Home Medications   Prior to Admission medications   Medication Sig Start Date End Date Taking? Authorizing Provider  amiodarone (PACERONE) 200 MG tablet Take 1 tablet (200 mg total) by mouth 2 (two) times daily. 04/09/15  Yes Lewayne Bunting, MD  amLODipine (NORVASC) 5 MG tablet Take 1 tablet (5 mg total) by mouth daily. Appointment needed for future refills 07/08/15  Yes Lewayne Bunting, MD  Ferrous Sulfate (IRON) 325 (65 FE) MG TABS Take 1 tablet by mouth daily.   Yes Historical Provider, MD  furosemide (LASIX) 80 MG tablet Take 1 tablet (80 mg total) by mouth 2 (two) times daily. 04/09/15  Yes Lewayne Bunting, MD  gabapentin (NEURONTIN) 300 MG capsule Take 1,200 mg by mouth 2 (two) times daily.    Yes Historical Provider, MD  Hypromellose (NATURAL BALANCE TEARS OP) Place 1  drop into both eyes daily as needed.   Yes Historical Provider, MD  insulin regular human CONCENTRATED (HUMULIN R) 500 UNIT/ML SOLN injection Inject 0.2 mLs (100 Units total) into the skin 2 (two) times daily with a meal. 12/11/14  Yes Rodolph Bong, MD  lisinopril (PRINIVIL,ZESTRIL) 10 MG tablet Take 1 tablet (10 mg total) by mouth daily. 04/09/15  Yes Lewayne Bunting, MD  metoprolol tartrate (LOPRESSOR) 25 MG tablet Take 1 tablet (25 mg total) by mouth 2 (two) times daily. 04/09/15  Yes Lewayne Bunting, MD  Multiple Vitamin (MULTIVITAMIN WITH MINERALS) TABS Take 1 tablet by mouth daily.   Yes Historical Provider, MD  oxycodone (ROXICODONE) 30 MG immediate release tablet Take 1 tablet (30 mg total) by mouth 4 (four) times daily. 12/11/14  Yes Rodolph Bong, MD  [employer] [place] FOOT  REJUVENATING) CREA Apply 1 application topically daily as needed (dry skin).   Yes Historical Provider, MD  potassium chloride SA (KLOR-CON M20) 20 MEQ tablet Take 1 tablet (20 mEq total) by mouth 2 (two) times daily. Patient taking differently: Take 20 mEq by mouth daily.  11/27/14  Yes Dione Booze, MD  rosuvastatin (CRESTOR) 40 MG tablet Take 40 mg by mouth daily.   Yes Historical Provider, MD  spironolactone (ALDACTONE) 25 MG tablet Take 1 tablet (25 mg total) by mouth daily. 04/09/15  Yes Lewayne Bunting, MD  warfarin (COUMADIN) 5 MG tablet Take 1 tablet by mouth daily or as directed by coumadin clinic Patient taking differently: Take 5 mg by mouth daily. Take 1 tablet by mouth daily or as directed by coumadin clinic 04/11/15  Yes Lewayne Bunting, MD  ACCU-CHEK AVIVA PLUS test strip  08/30/14   Historical Provider, MD  ACCU-CHEK FASTCLIX LANCETS MISC  08/17/13   Historical Provider, MD  amoxicillin-clavulanate (AUGMENTIN) 875-125 MG per tablet Take 1 tablet by mouth every 12 (twelve) hours. Patient not taking: Reported on 07/08/2015 12/11/14   Rodolph Bong, MD  BD INSULIN SYRINGE ULTRAFINE 31G X 15/64"  0.3 ML MISC  08/19/14   Historical Provider, MD   BP 143/71 mmHg  Pulse 108  Temp(Src) 98.3 F (36.8 C) (Oral)  Resp 21  SpO2 95% Physical Exam  Constitutional: He is oriented to person, place, and time. He appears well-developed and well-nourished. No distress.  Appears uncomfortable  HENT:  Head: Normocephalic and atraumatic.  Mouth/Throat: Oropharynx is clear and moist.  Eyes: EOM are normal. Pupils are equal, round, and reactive to light.  Neck: Normal range of motion. Neck supple.  Cardiovascular: Normal rate.  Exam reveals no gallop and no friction rub.   No murmur heard. Irregularly irregular  Pulmonary/Chest: Effort normal and breath sounds normal. No respiratory distress. He has no wheezes. He has no rales. He exhibits tenderness (hest wall is exquisitely tender to palpation over the left costal cartilage. No crepitance or deformity.).  Abdominal: Soft. Bowel sounds are normal. He exhibits no distension and no mass. There is no tenderness. There is no rebound and no guarding.  Musculoskeletal: Normal range of motion. He exhibits no edema or tenderness.  1+ bilateral lower extremity swelling. No calf tenderness. Distal pulses intact.  Neurological: He is alert and oriented to person, place, and time.  Moves all extremities without deficit. Sensation is fully intact.  Skin: Skin is warm and dry. No rash noted. No erythema.  Psychiatric: He has a normal mood and affect. His behavior is normal.  Nursing note and vitals reviewed.   ED Course  Procedures (including critical care time) Labs Review Labs Reviewed  BASIC METABOLIC PANEL - Abnormal; Notable for the following:    Glucose, Bld 183 (*)    All other components within normal limits  CBC - Abnormal; Notable for the following:    Hemoglobin 12.8 (*)    MCH 25.1 (*)    RDW 16.2 (*)    All other components within normal limits  PROTIME-INR - Abnormal; Notable for the following:    Prothrombin Time 26.2 (*)    INR 2.45  (*)    All other components within normal limits  BRAIN NATRIURETIC PEPTIDE  I-STAT TROPOININ, ED  Rosezena Sensor, ED    Imaging Review Dg Chest 2 View  07/08/2015  CLINICAL DATA:  64 year old male with 1 day history of chest pain EXAM: CHEST  2 VIEW COMPARISON:  Prior chest x-ray 12/04/2014 FINDINGS: Stable cardiomegaly. Atherosclerotic calcifications again noted in the transverse aorta. Pulmonary vascular congestion bordering on mild interstitial edema is slightly improved compared to prior. No focal airspace consolidation, pleural effusion or pneumothorax. No acute osseous abnormality. IMPRESSION: Stable cardiomegaly and pulmonary vascular congestion without overt edema. Electronically Signed   By: Malachy Moan M.D.   On: 07/08/2015 14:47   I have personally reviewed and evaluated these images and lab results as part of my medical decision-making.   EKG Interpretation   Date/Time:  Tuesday July 08 2015 14:03:11 EST Ventricular Rate:  101 PR Interval:    QRS Duration: 118 QT Interval:  374 QTC Calculation: 484 R Axis:   -70 Text Interpretation:  Atrial fibrillation with rapid ventricular response  Low voltage QRS Left anterior fascicular block Cannot rule out Anterior  infarct , age undetermined Abnormal ECG Since last tracing Atrial  fibrillation has replaced Normal sinus rhythm Confirmed by Hyacinth Meeker  MD,  BRIAN ([id number]) on 07/08/2015 3:56:08 PM Also confirmed by Ranae Palms  MD,  Madysin Crisp [phone])  on 07/08/2015 4:42:23 PM      MDM   Final diagnoses:  Atrial fibrillation with RVR (HCC)  Left-sided chest wall pain   Troponin 2 is normal. Patient is in atrial fibrillation but is therapeutic on his Coumadin. During ER stay heart rate went up into the 140s. Denied any symptoms with this. Started on Cardizem and discuss with hospitalist. Will admit to step down bed for atrial fibrillation and RVR.    Loren Racer, MD 07/08/15 2113

## 2015-07-08 NOTE — H&P (Signed)
[name] History and Physical  [name] XFG:[id number] DOB: [birth year] DOA: 07/08/2015  Referring physician: ED physician PCP: [name], MD  Specialists:   Chief Complaint: Chest wall pain after fall  HPI: [name] is a 64 y.o. male with PMH of hypertension, hyperlipidemia, CAD, S/P of stent placement, diastolic congestive heart failure, PAF on Coumadin, obesity, OSA, back pain, chronic kidney disease-stage III, who presents with chest wall pain after fall.  Pt reports that he fell on the chest wall while he did xmas decorations yesterday, then he developed chest wall pain. It is located in the front lower chest, moderate. There is tenderness over the injury area. He does not have cough. He has mild shortness of breath which is at his baseline. No fever or chills. Patient does not have abdominal pain, diarrhea, nausea, vomiting, symptoms of UTI or unilateral weakness.  In ED, patient was found to have atrial fibrillation with RVR, heart rate is up to 140, negative troponin 2, INR 2.45, temperature normal, tachycardia, stable renal function. Chest x-ray showed vascular congestion. Patient is admitted to the inpatient for further eval and treatment.   Where does patient live?   At home    Can patient participate in ADLs?  Yes    Review of Systems:   General: no fevers, chills, no changes in body weight, has fatigue HEENT: no blurry vision, hearing changes or sore throat Pulm: has dyspnea, no coughing, wheezing CV: has chest wall pain, palpitations Abd: no nausea, vomiting, abdominal pain, diarrhea, constipation GU: no dysuria, burning on urination, increased urinary frequency, hematuria  Ext: mild leg edema Neuro: no unilateral weakness, numbness, or tingling, no vision change or hearing loss Skin: no rash MSK: No muscle spasm, no deformity, no limitation of range of movement in spin Heme: No easy bruising.  Travel history: No recent long distant  travel.  Allergy: No Known Allergies  Past Medical History  Diagnosis Date  . Hypertension   . Chronic diastolic CHF (congestive heart failure) (HCC)   . CAD (coronary artery disease)     a. s/p PCI to RCA in 2012. b. prior cath in 01/2014 with elevated L/RH pressures, mild-mod CAD of LAD/LCx with patent RCA, normal EF, c/b CIN/CHF.  [name] PAF (paroxysmal atrial fibrillation) (HCC)     TEE DCCV 09/23/2014  . Anemia   . Pilonidal cyst 1980's; 01/25/2013  . Shortness of breath     with ambulation, with exertion  . Neuropathy (HCC)   . History of blood transfusion ~ 1954    "related to OR"  . Carpal tunnel syndrome, bilateral   . Morbid obesity (HCC)   . Myocardial infarction [facility])     pt stated he had a slient heart attack that showed up on nuclear stress.  . Hyperlipidemia   . OSA (obstructive sleep apnea)     "I wear nasal prongs; haven't been using prongs recently" (09/19/2014)  . Type II diabetes mellitus (HCC)   . Arthritis     "hands and lower back" (09/19/2014)  . Chronic lower back pain   . Chronic kidney disease (CKD), stage III (moderate)   . Cellulitis   . Hypoxia     a. Qualified for home O2 at DC in 09/2014.  [name] Physical deconditioning     Past Surgical History  Procedure Laterality Date  . Abdominal surgery  ~ 1954    BENIGN TUMOR REMOVED  . Debridement  foot Left     debriding diabetic foot ulcers  .  Cataract extraction w/phaco Right 11/15/2012    Procedure: CATARACT EXTRACTION PHACO AND INTRAOCULAR LENS PLACEMENT (IOC);  Surgeon: Shade Flood, MD;  Location: [facility] OR;  Service: Ophthalmology;  Laterality: Right;  . Cataract extraction w/phaco Left 11/29/2012    Procedure: CATARACT EXTRACTION PHACO AND INTRAOCULAR LENS PLACEMENT (IOC);  Surgeon: Shade Flood, MD;  Location: [facility] OR;  Service: Ophthalmology;  Laterality: Left;  . Pilonidal cyst excision N/A 01/08/2013    Procedure: CYST EXCISION PILONIDAL EXTENSIVE;  Surgeon: Axel Filler, MD;  Location: [facility] OR;  Service:  General;  Laterality: N/A;  . Foreign body removal Right 2014    heel,  splinter removal   . Pars plana vitrectomy Left 06/05/2013    Procedure: PARS PLANA VITRECTOMY WITH 23 GAUGE with Endolaser(constellation);  Surgeon: Shade Flood, MD;  Location: [facility] OR;  Service: Ophthalmology;  Laterality: Left;  . Left and right heart catheterization with coronary angiogram N/A 01/31/2014    Procedure: LEFT AND RIGHT HEART CATHETERIZATION WITH CORONARY ANGIOGRAM;  Surgeon: Micheline Chapman, MD;  Location: [facility] CATH LAB;  Service: Cardiovascular;  Laterality: N/A;  . Tonsillectomy    . Appendectomy    . Eye surgery    . Coronary angioplasty with stent placement  August 2012    RCA DES - [facility]  . Cardiac catheterization  01/2014  . Cardioversion  2010    [name] 09/19/2014  . Pilonidal cyst excision  1980's    "in [place]"  . Tee without cardioversion N/A 09/23/2014    Procedure: TRANSESOPHAGEAL ECHOCARDIOGRAM (TEE);  Surgeon: Lewayne Bunting, MD;  Location: [facility] ENDOSCOPY;  Service: Cardiovascular;  Laterality: N/A;  . Cardioversion N/A 09/23/2014    Procedure: CARDIOVERSION;  Surgeon: Lewayne Bunting, MD;  Location: [facility] ENDOSCOPY;  Service: Cardiovascular;  Laterality: N/A;  . Cardioversion N/A 12/11/2014    Procedure: CARDIOVERSION;  Surgeon: Thurmon Fair, MD;  Location: [facility] ENDOSCOPY;  Service: Cardiovascular;  Laterality: N/A;    Social History:  reports that he quit smoking about 32 years ago. His smoking use included Cigarettes. He has a 20 pack-year smoking history. He has never used smokeless tobacco. He reports that he does not drink alcohol or use illicit drugs.  Family History:  Family History  Problem Relation Age of Onset  . Adopted: Yes  . Other Other     PT ADOPTED     Prior to Admission medications   Medication Sig Start Date End Date Taking? Authorizing Provider  amiodarone (PACERONE) 200 MG tablet Take 1 tablet (200 mg total) by mouth 2 (two) times  daily. 04/09/15  Yes Lewayne Bunting, MD  amLODipine (NORVASC) 5 MG tablet Take 1 tablet (5 mg total) by mouth daily. Appointment needed for future refills 07/08/15  Yes Lewayne Bunting, MD  Ferrous Sulfate (IRON) 325 (65 FE) MG TABS Take 1 tablet by mouth daily.   Yes Historical Provider, MD  furosemide (LASIX) 80 MG tablet Take 1 tablet (80 mg total) by mouth 2 (two) times daily. 04/09/15  Yes Lewayne Bunting, MD  gabapentin (NEURONTIN) 300 MG capsule Take 1,200 mg by mouth 2 (two) times daily.    Yes Historical Provider, MD  Hypromellose (NATURAL BALANCE TEARS OP) Place 1 drop into both eyes daily as needed.   Yes Historical Provider, MD  insulin regular human CONCENTRATED (HUMULIN R) 500 UNIT/ML SOLN injection Inject 0.2 mLs (100 Units total) into the skin 2 (two) times daily with a meal. 12/11/14  Yes Rodolph Bong, MD  lisinopril (PRINIVIL,ZESTRIL) 10 MG tablet Take 1 tablet (10 mg total) by mouth daily. 04/09/15  Yes Lewayne Bunting, MD  metoprolol tartrate (LOPRESSOR) 25 MG tablet Take 1 tablet (25 mg total) by mouth 2 (two) times daily. 04/09/15  Yes Lewayne Bunting, MD  Multiple Vitamin (MULTIVITAMIN WITH MINERALS) TABS Take 1 tablet by mouth daily.   Yes Historical Provider, MD  oxycodone (ROXICODONE) 30 MG immediate release tablet Take 1 tablet (30 mg total) by mouth 4 (four) times daily. 12/11/14  Yes Rodolph Bong, MD  Podiatric Products (DIADERM FOOT REJUVENATING) CREA Apply 1 application topically daily as needed (dry skin).   Yes Historical Provider, MD  potassium chloride SA (KLOR-CON M20) 20 MEQ tablet Take 1 tablet (20 mEq total) by mouth 2 (two) times daily. Patient taking differently: Take 20 mEq by mouth daily.  11/27/14  Yes Dione Booze, MD  rosuvastatin (CRESTOR) 40 MG tablet Take 40 mg by mouth daily.   Yes Historical Provider, MD  spironolactone (ALDACTONE) 25 MG tablet Take 1 tablet (25 mg total) by mouth daily. 04/09/15  Yes Lewayne Bunting, MD  warfarin (COUMADIN) 5  MG tablet Take 1 tablet by mouth daily or as directed by coumadin clinic Patient taking differently: Take 5 mg by mouth daily. Take 1 tablet by mouth daily or as directed by coumadin clinic 04/11/15  Yes Lewayne Bunting, MD  ACCU-CHEK AVIVA PLUS test strip  08/30/14   Historical Provider, MD  ACCU-CHEK FASTCLIX LANCETS MISC  08/17/13   Historical Provider, MD  amoxicillin-clavulanate (AUGMENTIN) 875-125 MG per tablet Take 1 tablet by mouth every 12 (twelve) hours. Patient not taking: Reported on 07/08/2015 12/11/14   Rodolph Bong, MD  BD INSULIN SYRINGE ULTRAFINE 31G X 15/64" 0.3 ML MISC  08/19/14   Historical Provider, MD    Physical Exam: Filed Vitals:   07/08/15 1945 07/08/15 2015 07/08/15 2030 07/08/15 2100  BP: 127/72 137/87 155/93 143/71  Pulse: 97 84 105 108  Temp:      TempSrc:      Resp: 13  15 21   SpO2: 99% 100% 98% 95%   General: Not in acute distress HEENT:       Eyes: PERRL, EOMI, no scleral icterus.       ENT: No discharge from the ears and nose, no pharynx injection, no tonsillar enlargement.        Neck: No JVD, no bruit, no mass felt. Heme: No neck lymph node enlargement. Cardiac: S1/S2, irregularly irregular rhythm, tachycardia, No murmurs, No gallops or rubs. Pulm: No rales, wheezing, rhonchi or rubs. There is tenderness over front lower chest wall. Abd: Soft, nondistended, nontender, no rebound pain, no organomegaly, BS present. Ext: has trace leg edema bilaterally. 2+DP/PT pulse bilaterally. Musculoskeletal: No joint deformities, No joint redness or warmth, no limitation of ROM in spin. Skin: No rashes.  Neuro: Alert, oriented X3, cranial nerves II-XII grossly intact, muscle strength 5/5 in all extremities, sensation to light touch intact.  Psych: Patient is not psychotic, no suicidal or hemocidal ideation.  Labs on Admission:  Basic Metabolic Panel:  Recent Labs Lab 07/08/15 1417  NA 138  K 3.7  CL 102  CO2 23  GLUCOSE 183*  BUN 12  CREATININE 1.22   CALCIUM 9.0   Liver Function Tests: No results for input(s): AST, ALT, ALKPHOS, BILITOT, PROT, ALBUMIN in the last 168 hours. No results for input(s): LIPASE, AMYLASE in the last 168 hours. No results for input(s): AMMONIA in the last  168 hours. CBC:  Recent Labs Lab 07/08/15 1417  WBC 8.8  HGB 12.8*  HCT 40.3  MCV 79.2  PLT 284   Cardiac Enzymes: No results for input(s): CKTOTAL, CKMB, CKMBINDEX, TROPONINI in the last 168 hours.  BNP (last 3 results)  Recent Labs  09/19/14 1938 12/04/14 1756 07/08/15 1417  BNP 153.2* 163.7* 78.4    ProBNP (last 3 results) No results for input(s): PROBNP in the last 8760 hours.  CBG: No results for input(s): GLUCAP in the last 168 hours.  Radiological Exams on Admission: Dg Chest 2 View  07/08/2015  CLINICAL DATA:  64 year old male with 1 day history of chest pain EXAM: CHEST  2 VIEW COMPARISON:  Prior chest x-ray 12/04/2014 FINDINGS: Stable cardiomegaly. Atherosclerotic calcifications again noted in the transverse aorta. Pulmonary vascular congestion bordering on mild interstitial edema is slightly improved compared to prior. No focal airspace consolidation, pleural effusion or pneumothorax. No acute osseous abnormality. IMPRESSION: Stable cardiomegaly and pulmonary vascular congestion without overt edema. Electronically Signed   By: Malachy Moan M.D.   On: 07/08/2015 14:47    EKG: Independently reviewed.  Abnormal findings:  QTC 497, atrial fibrillation, tachycardia, LAD, poor R-wave progression   Assessment/Plan Principal Problem:   Atrial fibrillation with rapid ventricular response (HCC) Active Problems:   Diabetes mellitus type 2, uncontrolled, with complications (HCC)   Hyperlipidemia   OBESITY-MORBID BMI 54   Essential hypertension, benign   Atrial fibrillation- DCCV 2010   Obstructive sleep apnea- unable to tolerate c-pap   Coronary Artery Disease   Chronic diastolic congestive heart failure (HCC)   Chronic  anticoagulation- Coumadin   CKD (chronic kidney disease) stage 3, GFR 30-59 ml/min   Atrial fibrillation with RVR (HCC)   Chest wall pain  Atrial fibrillation with rapid ventricular response (HCC): Heart rate is up to 140. Patient has chest wall pain. Initial trop is negative. CHA2DS2-VASc Score is 3, needs oral anticoagulation. Patient is on Coumadin at home. INR is 2.45 on admission.   -will admit to SDU -Cardizem drip was started -continue metoprolol, amiodarone and Coumadin per pharmacy -Troponin 3 -Check TSH -repeat ekg in AM  Chest wall pain: due to injury. -f/u trop x 3 -prn percoct  DM-II: Last A1c 9.6 on 02/13/14, poorly controled. Patient is taking Humulin 100 units twice a day at home -will decrease Humulin dose from 100 unit the 90 unit the twice a day  -Check A1c  HLD: Last LDL was 11 on 11/06/09 -Continue home medications: Crestor -Check FLP  Essential hypertension, benign: -Lasix, lisinopril, metoprolol  Chronic diastolic congestive heart failure (HCC): 2-D echo on 12/05/14 showed EF of 50-55%. Patient is on Lasix and spironolactone at home. He has trace leg edema. CHF is compensated. -Continue Lasix 80 mg twice a day and spironolactone -Check BNP  CKD (chronic kidney disease) stage 3: stable. Creatinine 1.2 to, which is better than previous 1.82. -Follow-up renal function by BMP  DVT ppx: on coumadin  Code Status: Full code Family Communication: None at bed side. Disposition Plan: Admit to inpatient   Date of Service 07/08/2015    Lorretta Harp [name] Pager [phone]  If 7PM-7AM, please contact night-coverage [email or web address] Password [employer] 07/08/2015, 9:27 PM

## 2015-07-08 NOTE — ED Notes (Signed)
Pt in from home c/o mid non radiating cp onset today post xmas decorations falling on his chest, denies SOB, hx of CHF & A fib, denies n/v/d, pt A&O x4, follows commands, speaks in complete sentences

## 2015-07-08 NOTE — Progress Notes (Signed)
ANTICOAGULATION CONSULT NOTE - Initial Consult  Pharmacy Consult for Warfarin Indication: atrial fibrillation  No Known Allergies  Patient Measurements:   Heparin Dosing Weight:   Vital Signs: Temp: 98.3 F (36.8 C) (12/27 1412) Temp Source: Oral (12/27 1412) BP: 143/71 mmHg (12/27 2100) Pulse Rate: 108 (12/27 2100)  Labs:  Recent Labs  07/08/15 1417  HGB 12.8*  HCT 40.3  PLT 284  LABPROT 26.2*  INR 2.45*  CREATININE 1.22    CrCl cannot be calculated (Unknown ideal weight.).   Medical History: Past Medical History  Diagnosis Date  . Hypertension   . Chronic diastolic CHF (congestive heart failure) (HCC)   . CAD (coronary artery disease)     a. s/p PCI to RCA in 2012. b. prior cath in 01/2014 with elevated L/RH pressures, mild-mod CAD of LAD/LCx with patent RCA, normal EF, c/b CIN/CHF.  [name] PAF (paroxysmal atrial fibrillation) (HCC)     TEE DCCV 09/23/2014  . Anemia   . Pilonidal cyst 1980's; 01/25/2013  . Shortness of breath     with ambulation, with exertion  . Neuropathy (HCC)   . History of blood transfusion ~ 1954    "related to OR"  . Carpal tunnel syndrome, bilateral   . Morbid obesity (HCC)   . Myocardial infarction [facility])     pt stated he had a slient heart attack that showed up on nuclear stress.  . Hyperlipidemia   . OSA (obstructive sleep apnea)     "I wear nasal prongs; haven't been using prongs recently" (09/19/2014)  . Type II diabetes mellitus (HCC)   . Arthritis     "hands and lower back" (09/19/2014)  . Chronic lower back pain   . Chronic kidney disease (CKD), stage III (moderate)   . Cellulitis   . Hypoxia     a. Qualified for home O2 at DC in 09/2014.  [name] Physical deconditioning     Medications:   (Not in a hospital admission) Scheduled:  . amiodarone  200 mg Oral BID  . amLODipine  5 mg Oral Daily  . [START ON 07/09/2015] furosemide  80 mg Oral BID  . gabapentin  1,200 mg Oral BID  . [START ON 07/09/2015] insulin regular human  CONCENTRATED  90 Units Subcutaneous BID WC  . Iron  1 tablet Oral Daily  . lisinopril  10 mg Oral Daily  . metoprolol tartrate  25 mg Oral BID  . multivitamin with minerals  1 tablet Oral Daily  . oxycodone  30 mg Oral QID  . potassium chloride SA  20 mEq Oral Daily  . rosuvastatin  40 mg Oral Daily  . sodium chloride  3 mL Intravenous Q12H  . spironolactone  25 mg Oral Daily   Infusions:  . diltiazem (CARDIZEM) infusion      Assessment: 64yo male with history of PAF on warfarin, CHF, CAD, HTN, DM2 and CKD3 presents with CP. Pharmacy is consulted to dose warfarin for atrial fibrillation. INR 2.45, Hgb 12.8, Plt 284, trop 0.01>0.03, BNP 78.4, sCr 1.22.  PTA Warfarin Dose: 5mg /d with last dose 12/26  Goal of Therapy:  INR 2-3 Monitor platelets by anticoagulation protocol: Yes   Plan:  Warfarin 5mg  daily Daily INR/CBC Monitor s/sx of bleeding  1/27. , PharmD, BCPS Clinical Pharmacist Pager [phone] 07/08/2015,9:26 PM

## 2015-07-09 ENCOUNTER — Encounter (HOSPITAL_COMMUNITY): Payer: Self-pay | Admitting: Cardiology

## 2015-07-09 DIAGNOSIS — R0789 Other chest pain: Secondary | ICD-10-CM

## 2015-07-09 DIAGNOSIS — I5032 Chronic diastolic (congestive) heart failure: Secondary | ICD-10-CM

## 2015-07-09 DIAGNOSIS — E1165 Type 2 diabetes mellitus with hyperglycemia: Secondary | ICD-10-CM

## 2015-07-09 DIAGNOSIS — G4733 Obstructive sleep apnea (adult) (pediatric): Secondary | ICD-10-CM

## 2015-07-09 DIAGNOSIS — N183 Chronic kidney disease, stage 3 (moderate): Secondary | ICD-10-CM

## 2015-07-09 DIAGNOSIS — E118 Type 2 diabetes mellitus with unspecified complications: Secondary | ICD-10-CM

## 2015-07-09 DIAGNOSIS — I10 Essential (primary) hypertension: Secondary | ICD-10-CM

## 2015-07-09 DIAGNOSIS — I4891 Unspecified atrial fibrillation: Secondary | ICD-10-CM

## 2015-07-09 DIAGNOSIS — Z794 Long term (current) use of insulin: Secondary | ICD-10-CM

## 2015-07-09 DIAGNOSIS — E785 Hyperlipidemia, unspecified: Secondary | ICD-10-CM

## 2015-07-09 LAB — LIPID PANEL
CHOL/HDL RATIO: 8.1 ratio
CHOLESTEROL: 258 mg/dL — AB (ref 0–200)
HDL: 32 mg/dL — ABNORMAL LOW (ref >40)
LDL Cholesterol: UNDETERMINED mg/dL (ref 0–99)
TRIGLYCERIDES: 406 mg/dL — AB (ref <150)
VLDL: UNDETERMINED mg/dL (ref 0–40)

## 2015-07-09 LAB — BASIC METABOLIC PANEL
ANION GAP: 11 (ref 5–15)
BUN: 16 mg/dL (ref 6–20)
CO2: 24 mmol/L (ref 22–32)
Calcium: 9.1 mg/dL (ref 8.9–10.3)
Chloride: 97 mmol/L — ABNORMAL LOW (ref 101–111)
Creatinine, Ser: 1.42 mg/dL — ABNORMAL HIGH (ref 0.61–1.24)
GFR calc Af Amer: 59 mL/min — ABNORMAL LOW (ref >60)
GFR, EST NON AFRICAN AMERICAN: 51 mL/min — AB (ref >60)
Glucose, Bld: 523 mg/dL — ABNORMAL HIGH (ref 65–99)
POTASSIUM: 4.6 mmol/L (ref 3.5–5.1)
SODIUM: 132 mmol/L — AB (ref 135–145)

## 2015-07-09 LAB — CBC
HCT: 38.2 % — ABNORMAL LOW (ref 39.0–52.0)
HEMOGLOBIN: 12.2 g/dL — AB (ref 13.0–17.0)
MCH: 25.4 pg — ABNORMAL LOW (ref 26.0–34.0)
MCHC: 31.9 g/dL (ref 30.0–36.0)
MCV: 79.6 fL (ref 78.0–100.0)
PLATELETS: 311 10*3/uL (ref 150–400)
RBC: 4.8 MIL/uL (ref 4.22–5.81)
RDW: 16.3 % — ABNORMAL HIGH (ref 11.5–15.5)
WBC: 10.8 10*3/uL — AB (ref 4.0–10.5)

## 2015-07-09 LAB — GLUCOSE, CAPILLARY
GLUCOSE-CAPILLARY: 304 mg/dL — AB (ref 65–99)
GLUCOSE-CAPILLARY: 406 mg/dL — AB (ref 65–99)
Glucose-Capillary: 487 mg/dL — ABNORMAL HIGH (ref 65–99)
Glucose-Capillary: >600 mg/dL (ref 65–99)
Glucose-Capillary: 515 mg/dL — ABNORMAL HIGH (ref 65–99)
Glucose-Capillary: 176 mg/dL — ABNORMAL HIGH (ref 65–99)

## 2015-07-09 LAB — GLUCOSE, RANDOM
GLUCOSE: 525 mg/dL — AB (ref 65–99)
GLUCOSE: 691 mg/dL — AB (ref 65–99)

## 2015-07-09 LAB — BRAIN NATRIURETIC PEPTIDE: B NATRIURETIC PEPTIDE 5: 132.5 pg/mL — AB (ref 0.0–100.0)

## 2015-07-09 LAB — PROTIME-INR
INR: 2.08 — AB (ref 0.00–1.49)
PROTHROMBIN TIME: 23.2 s — AB (ref 11.6–15.2)

## 2015-07-09 LAB — TROPONIN I: Troponin I: <0.03 ng/mL (ref <0.031)

## 2015-07-09 LAB — MRSA PCR SCREENING: MRSA BY PCR: NEGATIVE

## 2015-07-09 LAB — TSH: TSH: 0.717 u[IU]/mL (ref 0.350–4.500)

## 2015-07-09 MED ORDER — INSULIN REGULAR HUMAN (CONC) 500 UNIT/ML ~~LOC~~ SOPN
50.0000 [IU] | PEN_INJECTOR | Freq: Three times a day (TID) | SUBCUTANEOUS | Status: DC
  Administered 2015-07-09 (×3): 100 [IU] via SUBCUTANEOUS
  Administered 2015-07-10: 50 [IU] via SUBCUTANEOUS
  Administered 2015-07-10: 100 [IU] via SUBCUTANEOUS
  Filled 2015-07-09: qty 3

## 2015-07-09 MED ORDER — RIVAROXABAN 20 MG PO TABS
20.0000 mg | ORAL_TABLET | Freq: Every day | ORAL | Status: DC
  Administered 2015-07-09: 20 mg via ORAL
  Filled 2015-07-09: qty 1

## 2015-07-09 MED ORDER — INSULIN ASPART 100 UNIT/ML ~~LOC~~ SOLN
8.0000 [IU] | Freq: Once | SUBCUTANEOUS | Status: AC
  Administered 2015-07-09: 8 [IU] via SUBCUTANEOUS

## 2015-07-09 MED ORDER — DILTIAZEM HCL ER 60 MG PO CP12
60.0000 mg | ORAL_CAPSULE | Freq: Two times a day (BID) | ORAL | Status: DC
  Administered 2015-07-09 – 2015-07-10 (×3): 60 mg via ORAL
  Filled 2015-07-09 (×5): qty 1

## 2015-07-09 MED ORDER — METHOCARBAMOL 500 MG PO TABS
500.0000 mg | ORAL_TABLET | Freq: Three times a day (TID) | ORAL | Status: DC | PRN
  Administered 2015-07-10: 500 mg via ORAL
  Filled 2015-07-09: qty 1

## 2015-07-09 MED ORDER — INSULIN DETEMIR 100 UNIT/ML ~~LOC~~ SOLN
15.0000 [IU] | Freq: Every day | SUBCUTANEOUS | Status: DC
  Administered 2015-07-09 – 2015-07-10 (×2): 15 [IU] via SUBCUTANEOUS
  Filled 2015-07-09 (×2): qty 0.15

## 2015-07-09 MED ORDER — INSULIN ASPART 100 UNIT/ML ~~LOC~~ SOLN
0.0000 [IU] | Freq: Three times a day (TID) | SUBCUTANEOUS | Status: DC
Start: 2015-07-09 — End: 2015-07-10
  Administered 2015-07-09: 15 [IU] via SUBCUTANEOUS
  Administered 2015-07-09 – 2015-07-10 (×2): 11 [IU] via SUBCUTANEOUS
  Administered 2015-07-10: 2 [IU] via SUBCUTANEOUS

## 2015-07-09 MED ORDER — MORPHINE SULFATE (PF) 2 MG/ML IV SOLN
2.0000 mg | INTRAVENOUS | Status: DC | PRN
Start: 2015-07-09 — End: 2015-07-10
  Administered 2015-07-09 – 2015-07-10 (×9): 2 mg via INTRAVENOUS
  Filled 2015-07-09 (×8): qty 1

## 2015-07-09 NOTE — Evaluation (Signed)
Occupational Therapy Evaluation Patient Details Name: [name] MRN: [id number] DOB: 1951/01/04 Today's Date: 07/09/2015    History of Present Illness 64 yo male admitted s/p fall and found to have chest pain/ AFIB. PMH CHF afib CAD PAF neuropathy, MI, morbid obesity OSA, DM2, arthritis, extensive cardacic hx   Clinical Impression   PT admitted with afib and s/p fall putting away [employer]. Pt currently with functional limitiations due to the deficits listed below (see OT problem list). PTA patient reports being very sedentary life style due to back pain and previous falls over a year ago. Pt without awareness to balance deficits or HR increase 123 -111 sitting. Pt will benefit from skilled OT to increase their independence and safety with adls and balance to allow discharge outpatient. Pt with balance deficits and could benefit from outpatient follow up to help monitor vital signs with incr activity/ strengthening. Pt agreeable to this recommendation. Pt could benefit from RW but adamantly denies this recommendation.     Follow Up Recommendations  Outpatient OT    Equipment Recommendations  None recommended by OT (pt denies any DME recommended at this time.)    Recommendations for Other Services       Precautions / Restrictions Precautions Precautions: Fall      Mobility Bed Mobility               General bed mobility comments: in chair on arrival. Pt reports he sleeps in a recliner at baseline  Transfers Overall transfer level: Needs assistance   Transfers: Sit to/from Stand Sit to Stand: Min guard         General transfer comment: pt with rocking momentum to achieve sit<>Stand. Pt able to static stand without UE support    Balance Overall balance assessment: Needs assistance         Standing balance support: During functional activity;No upper extremity supported Standing balance-Leahy Scale: Fair                 High Level Balance  Comments: LOB while ambulating in hall way with L head turn.             ADL Overall ADL's : Needs assistance/impaired Eating/Feeding: Independent Eating/Feeding Details (indicate cue type and reason): opening can and pouring drink                 Lower Body Dressing: Supervision/safety;Sit to/from stand Lower Body Dressing Details (indicate cue type and reason): able to cross bil LE in chair and return demo. Did not that chair was soiled from poor peri hygiene and pt with lack of awareness Toilet Transfer: Supervision/safety Toilet Transfer Details (indicate cue type and reason): simulated with chair transfer and use of BIL UE         Functional mobility during ADLs: Min guard General ADL Comments: pt declines to use RW even with education about helping with back pain. pt statse "i am not using no walker" Pt adament about avoiding all DME. pt demonstrate LOB which he reports "i wasnt falling" Pt with turning L in hallway immediate LOB with self correction but denies LOB. pt states "its my feet and these slick socks. I have neuropathy" Pt with lack of awareness to fall risk during ambulation and reports this was happening several years ago but is better now. OT to follow due to balance deficits     Vision Vision Assessment?: No apparent visual deficits   Perception     Praxis  Pertinent Vitals/Pain Pain Assessment: No/denies pain     Hand Dominance Right   Extremity/Trunk Assessment Upper Extremity Assessment Upper Extremity Assessment: Generalized weakness   Lower Extremity Assessment Lower Extremity Assessment: Defer to PT evaluation   Cervical / Trunk Assessment Cervical / Trunk Assessment: Kyphotic   Communication Communication Communication: No difficulties   Cognition Arousal/Alertness: Awake/alert Behavior During Therapy: WFL for tasks assessed/performed Overall Cognitive Status: Within Functional Limits for tasks assessed                      General Comments       Exercises       Shoulder Instructions      Home Living Family/patient expects to be discharged to:: Private residence Living Arrangements: Other relatives (sister) Available Help at Discharge: Family;Available PRN/intermittently Type of Home: House Home Access: Stairs to enter [employer] of Steps: 4 Entrance Stairs-Rails: Right Home Layout: One level     Bathroom Shower/Tub: Chief Strategy Officer: Standard     Home Equipment: Shower seat;Grab bars - tub/shower;Hand held shower head   Additional Comments: pt lives with his sister, they keep an eye on each other. Drives, does his own shopping and self care      Prior Functioning/Environment Level of Independence: Independent        Comments: Drives, sleeps in recliner at baseline instead of a bed, pt uses cart when at grocery store    OT Diagnosis: Generalized weakness   OT Problem List: Decreased strength;Decreased activity tolerance;Impaired balance (sitting and/or standing);Decreased safety awareness;Decreased knowledge of use of DME or AE;Cardiopulmonary status limiting activity;Obesity   OT Treatment/Interventions: Self-care/ADL training;Therapeutic exercise;DME and/or AE instruction;Therapeutic activities;Patient/family education;Balance training    OT Goals(Current goals can be found in the care plan section) Acute Rehab OT Goals Patient Stated Goal: to join a gym OT Goal Formulation: With patient Time For Goal Achievement: 07/23/15 Potential to Achieve Goals: Good  OT Frequency: Min 2X/week   Barriers to D/C: Decreased caregiver support  sister home in the evenings otherwise patient alone       Co-evaluation PT/OT/SLP Co-Evaluation/Treatment: Yes Reason for Co-Treatment: For patient/therapist safety   OT goals addressed during session: ADL's and self-care      End of Session Equipment Utilized During Treatment: Gait belt Nurse Communication:  Mobility status;Precautions  Activity Tolerance: Patient tolerated treatment well (HR 123) Patient left: in chair;with call bell/phone within reach   Time: 1130-1206 OT Time Calculation (min): 36 min Charges:  OT General Charges $OT Visit: 1 Procedure OT Evaluation $Initial OT Evaluation Tier I: 1 Procedure G-Codes:    Harolyn Rutherford 08/05/15, 1:57 PM   Mateo Flow   OTR/L Pager: [phone] Office: [phone] .

## 2015-07-09 NOTE — Care Management Note (Signed)
Case Management Note  Patient Details  Name: [name] MRN: [id number] Date of Birth: 09-Jun-1951  Subjective/Objective:       Adm w hyperglycemia, at fib            Action/Plan: lives w fam, pcp dr Docia Chuck   Expected Discharge Date:                  Expected Discharge Plan:     In-House Referral:     Discharge planning Services     Post Acute Care Choice:    Choice offered to:     DME Arranged:    DME Agency:     HH Arranged:    HH Agency:     Status of Service:     Medicare Important Message Given:    Date Medicare IM Given:    Medicare IM give by:    Date Additional Medicare IM Given:    Additional Medicare Important Message give by:     If discussed at Long Length of Stay Meetings, dates discussed:    Additional Comments: ur review done  Hanley Hays, RN 07/09/2015, 10:41 AM

## 2015-07-09 NOTE — Evaluation (Signed)
Physical Therapy Evaluation Patient Details Name: [name] MRN: [id number] DOB: [birth year] Today's Date: 07/09/2015   History of Present Illness  64 yo male admitted s/p fall and found to have chest pain/ AFIB. PMH CHF afib CAD PAF neuropathy, MI, morbid obesity OSA, DM2, arthritis, extensive cardacic hx  Clinical Impression  Pt reports "i'm pretty sedentary because I can't walk because of my chronic intense back pain. It feels like a knife when I walk." Pt with episode of falling to left during ambulation requiring PT to provide assist to maintain balance. Pts HR 123 during ambulation. Pt expressed desire to get back to exercising and joining gym. Educated pt on the benefit of starting with outpt PT so his vitals could be monitored since he hasn't exercised in a few years. Pt agreeable to these services.    Follow Up Recommendations Outpatient PT;Supervision - Intermittent    Equipment Recommendations  None recommended by PT    Recommendations for Other Services       Precautions / Restrictions Precautions Precautions: Fall      Mobility  Bed Mobility               General bed mobility comments: in chair on arrival. Pt reports he sleeps in a recliner at baseline  Transfers Overall transfer level: Needs assistance   Transfers: Sit to/from Stand Sit to Stand: Min guard         General transfer comment: pt with rocking momentum to achieve sit<>Stand. Pt able to static stand without UE support  Ambulation/Gait Ambulation/Gait assistance: Min guard Ambulation Distance (Feet): 150 Feet Assistive device: None Gait Pattern/deviations: Step-through pattern;Staggering left;Wide base of support Gait velocity: impulsively fast   General Gait Details: v/c's to slow down pace, pt reports "this is how I walk" pt with an episode where pt appeared to be falling to Left side, PT reached to assist pt and pt reports "i'm not falling, it's just my neuropathy in my legs" pt  with SOB, HR in 120s  Stairs            Wheelchair Mobility    Modified Rankin (Stroke Patients Only)       Balance Overall balance assessment: Needs assistance         Standing balance support: During functional activity Standing balance-Leahy Scale: Fair                 High Level Balance Comments: LOB while ambulating in hall way with L head turn.              Pertinent Vitals/Pain Pain Assessment: No/denies pain    Home Living Family/patient expects to be discharged to:: Private residence Living Arrangements: Other relatives (sister) Available Help at Discharge: Family;Available PRN/intermittently Type of Home: House Home Access: Stairs to enter Entrance Stairs-Rails: Right Entrance Stairs-Number of Steps: 4 Home Layout: One level Home Equipment: Shower seat;Grab bars - tub/shower;Hand held shower head Additional Comments: pt lives with his sister, they keep an eye on each other. Drives, does his own shopping and self care    Prior Function Level of Independence: Independent         Comments: Drives, sleeps in recliner at baseline instead of a bed, pt uses cart when at grocery store     Hand Dominance   Dominant Hand: Right    Extremity/Trunk Assessment   Upper Extremity Assessment: Generalized weakness           Lower Extremity Assessment: Generalized weakness  Cervical / Trunk Assessment: Kyphotic  Communication   Communication: No difficulties  Cognition Arousal/Alertness: Awake/alert Behavior During Therapy: WFL for tasks assessed/performed Overall Cognitive Status: Within Functional Limits for tasks assessed                      General Comments      Exercises        Assessment/Plan    PT Assessment Patient needs continued PT services  PT Diagnosis Difficulty walking   PT Problem List Decreased strength;Decreased range of motion;Decreased activity tolerance;Decreased balance;Decreased mobility   PT Treatment Interventions DME instruction;Stair training;Gait training;Functional mobility training;Therapeutic activities;Therapeutic exercise   PT Goals (Current goals can be found in the Care Plan section) Acute Rehab PT Goals Patient Stated Goal: to join a gym PT Goal Formulation: With patient Time For Goal Achievement: 07/16/15 Potential to Achieve Goals: Good    Frequency Min 2X/week   Barriers to discharge        Co-evaluation PT/OT/SLP Co-Evaluation/Treatment: Yes Reason for Co-Treatment: For patient/therapist safety PT goals addressed during session: Mobility/safety with mobility OT goals addressed during session: ADL's and self-care       End of Session   Activity Tolerance: Patient tolerated treatment well Patient left: in chair;with call bell/phone within reach Nurse Communication: Mobility status         Time: 1140-1206 PT Time Calculation (min) (ACUTE ONLY): 26 min   Charges:   PT Evaluation $Initial PT Evaluation Tier I: 1 Procedure     PT G CodesMarcene Brawn 07/09/2015, 3:01 PM   Lewis Shock, PT, DPT Pager #: [phone] Office #: [phone]

## 2015-07-09 NOTE — Progress Notes (Signed)
CRITICAL VALUE ALERT  Critical value received:  Serum glucose 691  Date of notification:  07/09/2015  Time of notification:  1230  Critical value read back:yes  Nurse who received alert:  Aline August RN  MD notified (1st page):  Dr. Gwenlyn Perking  Time of first page: 1031  MD notified (2nd page):  Time of second page:  Responding MD: Dr. Gwenlyn Perking  Time MD responded:  [phone]

## 2015-07-09 NOTE — Progress Notes (Signed)
[name] PROGRESS NOTE  [name] DUK:[id number] DOB: 21-May-1951 DOA: 07/08/2015 PCP: [name], MD  Assessment/Plan: Atrial fibrillation with rapid ventricular response [id number]): Heart rate is up to 140. Patient has chest wall pain. Initial trop is negative. CHA2DS2-VASc Score is 3; patient is on coumadin. INR is 2.45 on admission.  -will transfer to telemetry -Cardizem transitioned to PO -Continue metoprolol, amiodarone and Coumadin per pharmacy -TSH WNL -cardiology consulted; will follow rec's  Chest wall pain: due to MSK injury. -troponin essentially neg and no ischemic changes seen on EKG -prn percocet and robaxin for pain  DM-II: Last A1c 9.6 on 02/13/14, poorly controled.  -Patient is taking Humulin at home; will resume home regimen and use SSI (mainly to serve as meal coverage) -will follow repeated A1C  HLD:  -Continue home medications: Crestor -LDL unable to be calculated   Essential hypertension, benign: -stable and well controlled -will continue home medication regimen; except for amlodipine, which will substitute by cardizem  Chronic diastolic congestive heart failure (HCC): 2-D echo on 12/05/14 showed EF of 50-55%. Patient is on Lasix, lisinopril, B-blocker and spironolactone at home. CHF is compensated. -BNP 132.5 (patient is obese) -no signs of exacerbation appreciated -will continue home medication regimen   CKD (chronic kidney disease) stage 3: stable and at baseline -Will Follow-up renal function   DVT ppx: on chronic coumadin; will satisfy prophylaxis   Code Status: full code Family Communication: no family at bedside Disposition Plan: will transfer out of stepdown; cardizem transitioned to PO. Will follow cardiology rec's   Consultants:  Cardiology   Procedures:  See below for x-ray reports   Antibiotics:  None   HPI/Subjective: Rate is now controlled, no SOB, no fever. Still with some discomfort on his chest    Objective: Filed Vitals:   07/09/15 0300 07/09/15 0732  BP: 122/62 132/69  Pulse: 87 86  Temp: 98.6 F (37 C) 97.9 F (36.6 C)  Resp: 20 20    Intake/Output Summary (Last 24 hours) at 07/09/15 0859 Last data filed at 07/09/15 0750  Gross per 24 hour  Intake    620 ml  Output   1250 ml  Net   -630 ml   Filed Weights   07/09/15 0100  Weight: 169.9 kg (374 lb 9 oz)    Exam:   General:  Afebrile, still with some muscular discomfort on his chest; no nausea, no vomiting and no SOB  Cardiovascular: rate is controled now (in the mid to high 90's); irregular, no rubs or gallops  Respiratory: CTA bilaterally  Abdomen: soft, NT, ND, positive BS  Musculoskeletal: trace edema bilaterally, no cyanosis   Data Reviewed: Basic Metabolic Panel:  Recent Labs Lab 07/08/15 1417 07/09/15 0650  NA 138 132*  K 3.7 4.6  CL 102 97*  CO2 23 24  GLUCOSE 183* 523*  BUN 12 16  CREATININE 1.22 1.42*  CALCIUM 9.0 9.1   CBC:  Recent Labs Lab 07/08/15 1417 07/09/15 0650  WBC 8.8 10.8*  HGB 12.8* 12.2*  HCT 40.3 38.2*  MCV 79.2 79.6  PLT 284 311   Cardiac Enzymes:  Recent Labs Lab 07/08/15 2230 07/09/15 0238  TROPONINI 0.06* <0.03   BNP (last 3 results)  Recent Labs  12/04/14 1756 07/08/15 1417 07/09/15 0650  BNP 163.7* 78.4 132.5*    CBG:  Recent Labs Lab 07/08/15 2333 07/09/15 0524  GLUCAP 406* 487*    Recent Results (from the past 240 hour(s))  MRSA PCR Screening     Status:  None   Collection Time: 07/08/15 11:24 PM  Result Value Ref Range Status   MRSA by PCR NEGATIVE NEGATIVE Final    Comment:        The GeneXpert MRSA Assay (FDA approved for NASAL specimens only), is one component of a comprehensive MRSA colonization surveillance program. It is not intended to diagnose MRSA infection nor to guide or monitor treatment for MRSA infections.      Studies: Dg Chest 2 View  07/08/2015  CLINICAL DATA:  64 year old male with 1 day history  of chest pain EXAM: CHEST  2 VIEW COMPARISON:  Prior chest x-ray 12/04/2014 FINDINGS: Stable cardiomegaly. Atherosclerotic calcifications again noted in the transverse aorta. Pulmonary vascular congestion bordering on mild interstitial edema is slightly improved compared to prior. No focal airspace consolidation, pleural effusion or pneumothorax. No acute osseous abnormality. IMPRESSION: Stable cardiomegaly and pulmonary vascular congestion without overt edema. Electronically Signed   By: Malachy Moan M.D.   On: 07/08/2015 14:47    Scheduled Meds: . amiodarone  200 mg Oral BID  . diltiazem  60 mg Oral Q12H  . ferrous sulfate  325 mg Oral Q breakfast  . furosemide  80 mg Oral BID  . gabapentin  1,200 mg Oral BID  . insulin aspart  0-15 Units Subcutaneous TID WC  . insulin regular human CONCENTRATED  90 Units Subcutaneous BID WC  . lisinopril  10 mg Oral Daily  . metoprolol tartrate  25 mg Oral BID  . multivitamin with minerals  1 tablet Oral Daily  . oxycodone  30 mg Oral QID  . potassium chloride SA  20 mEq Oral Daily  . rosuvastatin  40 mg Oral q1800  . sodium chloride  3 mL Intravenous Q12H  . spironolactone  25 mg Oral Daily  . warfarin  5 mg Oral q1800  . Warfarin - Pharmacist Dosing Inpatient   Does not apply q1800  . Warfarin - Pharmacist Dosing Inpatient   Does not apply q1800   Continuous Infusions:   Principal Problem:   Atrial fibrillation with rapid ventricular response (HCC) Active Problems:   Diabetes mellitus type 2, uncontrolled, with complications (HCC)   Hyperlipidemia   OBESITY-MORBID BMI 54   Essential hypertension, benign   Atrial fibrillation- DCCV 2010   Obstructive sleep apnea- unable to tolerate c-pap   Coronary Artery Disease   Chronic diastolic congestive heart failure (HCC)   Chronic anticoagulation- Coumadin   CKD (chronic kidney disease) stage 3, GFR 30-59 ml/min   Atrial fibrillation with RVR (HCC)   Chest wall pain    Time spent: 35  minutes    [name]  [name] Pager [phone]. If 7PM-7AM, please contact night-coverage at [email or web address], password [email or web address] 07/09/2015, 8:59 AM  LOS: 1 day

## 2015-07-09 NOTE — Progress Notes (Signed)
ANTICOAGULATION CONSULT NOTE  Pharmacy Consult for Warfarin Indication: atrial fibrillation  No Known Allergies  Patient Measurements: Height: 6' (182.9 cm) Weight: (!) 374 lb 9 oz (169.9 kg) IBW/kg (Calculated) : 77.6  Vital Signs: Temp: 97.9 F (36.6 C) (12/28 0732) Temp Source: Oral (12/28 0732) BP: 132/69 mmHg (12/28 0732) Pulse Rate: 86 (12/28 0732)  Labs:  Recent Labs  07/08/15 1417 07/08/15 2230 07/09/15 0238 07/09/15 0650  HGB 12.8*  --   --  12.2*  HCT 40.3  --   --  38.2*  PLT 284  --   --  311  APTT  --  39*  --   --   LABPROT 26.2*  --   --  23.2*  INR 2.45*  --   --  2.08*  CREATININE 1.22  --   --  1.42*  TROPONINI  --  0.06* <0.03  --     Estimated Creatinine Clearance: 86.2 mL/min (by C-G formula based on Cr of 1.42).   Medical History: Past Medical History  Diagnosis Date  . Hypertension   . Chronic diastolic CHF (congestive heart failure) (HCC)   . CAD (coronary artery disease)     a. s/p PCI to RCA in 2012. b. prior cath in 01/2014 with elevated L/RH pressures, mild-mod CAD of LAD/LCx with patent RCA, normal EF, c/b CIN/CHF.  [name] PAF (paroxysmal atrial fibrillation) (HCC)     TEE DCCV 09/23/2014  . Anemia   . Pilonidal cyst 1980's; 01/25/2013  . Shortness of breath     with ambulation, with exertion  . Neuropathy (HCC)   . History of blood transfusion ~ 1954    "related to OR"  . Carpal tunnel syndrome, bilateral   . Morbid obesity (HCC)   . Myocardial infarction [facility])     pt stated he had a slient heart attack that showed up on nuclear stress.  . Hyperlipidemia   . OSA (obstructive sleep apnea)     "I wear nasal prongs; haven't been using prongs recently" (09/19/2014)  . Type II diabetes mellitus (HCC)   . Arthritis     "hands and lower back" (09/19/2014)  . Chronic lower back pain   . Chronic kidney disease (CKD), stage III (moderate)   . Cellulitis   . Hypoxia     a. Qualified for home O2 at DC in 09/2014.  [name] Physical deconditioning     Assessment: 64yo male with history of PAF on warfarin, CHF, CAD, HTN, DM2 and CKD3 presents with CP. Pharmacy is consulted to dose warfarin for atrial fibrillation.  INR 2.08, cbc stable, no bleeding noted  PTA Warfarin Dose: 5mg /d   Goal of Therapy:  INR 2-3 Monitor platelets by anticoagulation protocol: Yes   Plan:  Continue Warfarin 5mg  daily Daily INR/CBC Monitor s/sx of bleeding  PharmD., BCPS Clinical Pharmacist Pager [phone] 07/09/2015 10:05 AM

## 2015-07-09 NOTE — Consult Note (Signed)
CARDIOLOGY CONSULT NOTE  Patient ID: [name] MRN: [id number] DOB/AGE: [id number] 63 y.o.  Admit date: 07/08/2015 Primary Physician [name], MD Primary Cardiologist Dr. [name] Chief Complaint  Atrial fib with RVR  HPI:  The patient has a history of CAD and chronic diastolic HF.  He has a history of atrial fib with cardioversion in March 2016.   The patient presented to the hospital after trying to put a box away and feeling a "pop" in his chest.  He had pain associated with this.  He came into the ED after a fall.  He is in atrial fib with rapid ventricular rate.    Currently his rate is controlled with PO Cardizem.  Of note the patient has missed many appts in our clinic and in our Coumadin clinic.  He had an INR greater than 8 in the fall and never came back for follow up.  He reports that it is hard for him to get to the appts and that parking is difficult.  He has chronic back pain and joint pain that limits him.  He has chronic DOE and has PRN oxygen.  However, this has been unchanged.  He was surprised to find out that he is atrial fib.  He does not feel this and has had no palpitations, presyncope or syncope.  He has had no new swelling.  He sleeps in his recliner and has done this for some time.    Past Medical History  Diagnosis Date  . Hypertension   . Chronic diastolic CHF (congestive heart failure) (HCC)   . CAD (coronary artery disease)     a. s/p PCI to RCA in 2012. b. prior cath in 01/2014 with elevated L/RH pressures, mild-mod CAD of LAD/LCx with patent RCA, normal EF, c/b CIN/CHF.  [name] PAF (paroxysmal atrial fibrillation) (HCC)     TEE DCCV 09/23/2014  . Anemia   . Pilonidal cyst 1980's; 01/25/2013  . Shortness of breath     with ambulation, with exertion  . Neuropathy (HCC)   . History of blood transfusion ~ 1954    "related to OR"  . Carpal tunnel syndrome, bilateral   . Morbid obesity (HCC)   . Myocardial infarction [facility])     pt stated he had a slient  heart attack that showed up on nuclear stress.  . Hyperlipidemia   . OSA (obstructive sleep apnea)     "I wear nasal prongs; haven't been using prongs recently" (09/19/2014)  . Type II diabetes mellitus (HCC)   . Arthritis     "hands and lower back" (09/19/2014)  . Chronic lower back pain   . Chronic kidney disease (CKD), stage III (moderate)   . Cellulitis   . Hypoxia     a. Qualified for home O2 at [facility] in 09/2014.  [name] Physical deconditioning     Past Surgical History  Procedure Laterality Date  . Abdominal surgery  ~ 1954    BENIGN TUMOR REMOVED  . Debridement  foot Left     debriding diabetic foot ulcers  . Cataract extraction w/phaco Right 11/15/2012    Procedure: CATARACT EXTRACTION PHACO AND INTRAOCULAR LENS PLACEMENT (IOC);  Surgeon: Shade Flood, MD;  Location: [facility] OR;  Service: Ophthalmology;  Laterality: Right;  . Cataract extraction w/phaco Left 11/29/2012    Procedure: CATARACT EXTRACTION PHACO AND INTRAOCULAR LENS PLACEMENT (IOC);  Surgeon: Shade Flood, MD;  Location: [facility] OR;  Service: Ophthalmology;  Laterality: Left;  . Pilonidal cyst excision N/A 01/08/2013  Procedure: CYST EXCISION PILONIDAL EXTENSIVE;  Surgeon: Axel Filler, MD;  Location: [facility] OR;  Service: General;  Laterality: N/A;  . Foreign body removal Right 2014    heel,  splinter removal   . Pars plana vitrectomy Left 06/05/2013    Procedure: PARS PLANA VITRECTOMY WITH 23 GAUGE with Endolaser(constellation);  Surgeon: Shade Flood, MD;  Location: [facility] OR;  Service: Ophthalmology;  Laterality: Left;  . Left and right heart catheterization with coronary angiogram N/A 01/31/2014    Procedure: LEFT AND RIGHT HEART CATHETERIZATION WITH CORONARY ANGIOGRAM;  Surgeon: Micheline Chapman, MD;  Location: [facility] CATH LAB;  Service: Cardiovascular;  Laterality: N/A;  . Tonsillectomy    . Appendectomy    . Eye surgery    . Coronary angioplasty with stent placement  August 2012    RCA DES - [facility]  .  Cardiac catheterization  01/2014  . Cardioversion  2010    [name] 09/19/2014  . Pilonidal cyst excision  1980's    "in [place]"  . Tee without cardioversion N/A 09/23/2014    Procedure: TRANSESOPHAGEAL ECHOCARDIOGRAM (TEE);  Surgeon: Lewayne Bunting, MD;  Location: [facility] ENDOSCOPY;  Service: Cardiovascular;  Laterality: N/A;  . Cardioversion N/A 09/23/2014    Procedure: CARDIOVERSION;  Surgeon: Lewayne Bunting, MD;  Location: [facility] ENDOSCOPY;  Service: Cardiovascular;  Laterality: N/A;  . Cardioversion N/A 12/11/2014    Procedure: CARDIOVERSION;  Surgeon: Thurmon Fair, MD;  Location: [facility] ENDOSCOPY;  Service: Cardiovascular;  Laterality: N/A;    No Known Allergies Prescriptions prior to admission  Medication Sig Dispense Refill Last Dose  . amiodarone (PACERONE) 200 MG tablet Take 1 tablet (200 mg total) by mouth 2 (two) times daily. 180 tablet 2 07/07/2015 at Unknown time  . amLODipine (NORVASC) 5 MG tablet Take 1 tablet (5 mg total) by mouth daily. Appointment needed for future refills 30 tablet 0 07/07/2015 at Unknown time  . Ferrous Sulfate (IRON) 325 (65 FE) MG TABS Take 1 tablet by mouth daily.   07/07/2015 at Unknown time  . furosemide (LASIX) 80 MG tablet Take 1 tablet (80 mg total) by mouth 2 (two) times daily. 180 tablet 2 07/07/2015 at Unknown time  . gabapentin (NEURONTIN) 300 MG capsule Take 1,200 mg by mouth 2 (two) times daily.    07/07/2015 at Unknown time  . Hypromellose (NATURAL BALANCE TEARS OP) Place 1 drop into both eyes daily as needed.   Past Week at Unknown time  . insulin regular human CONCENTRATED (HUMULIN R) 500 UNIT/ML SOLN injection Inject 0.2 mLs (100 Units total) into the skin 2 (two) times daily with a meal. (Patient taking differently: Inject 50-100 Units into the skin 3 (three) times daily before meals. Blood sugar >200 = 100 units; blood sugar <200 = 50 units) 20 mL 0 07/07/2015 at Unknown time  . lisinopril (PRINIVIL,ZESTRIL) 10 MG tablet Take 1 tablet (10 mg total) by  mouth daily. 90 tablet 2 07/07/2015 at Unknown time  . metoprolol tartrate (LOPRESSOR) 25 MG tablet Take 1 tablet (25 mg total) by mouth 2 (two) times daily. 180 tablet 2 07/07/2015 at 0800  . Multiple Vitamin (MULTIVITAMIN WITH MINERALS) TABS Take 1 tablet by mouth daily.   07/07/2015 at Unknown time  . oxycodone (ROXICODONE) 30 MG immediate release tablet Take 1 tablet (30 mg total) by mouth 4 (four) times daily. 20 tablet 0 07/07/2015 at Unknown time  . Podiatric Products (DIADERM FOOT REJUVENATING) CREA Apply 1 application topically daily as needed (dry skin).  07/07/2015 at Unknown time  . potassium chloride SA (KLOR-CON M20) 20 MEQ tablet Take 1 tablet (20 mEq total) by mouth 2 (two) times daily. (Patient taking differently: Take 20 mEq by mouth daily. ) 60 tablet 6 07/07/2015 at Unknown time  . rosuvastatin (CRESTOR) 40 MG tablet Take 40 mg by mouth daily.   07/07/2015 at Unknown time  . spironolactone (ALDACTONE) 25 MG tablet Take 1 tablet (25 mg total) by mouth daily. 90 tablet 2 07/07/2015 at Unknown time  . warfarin (COUMADIN) 5 MG tablet Take 1 tablet by mouth daily or as directed by coumadin clinic (Patient taking differently: Take 5 mg by mouth daily. Take 1 tablet by mouth daily or as directed by coumadin clinic) 90 tablet 0 07/07/2015 at Unknown time  . ACCU-CHEK AVIVA PLUS test strip   0 Past Week at Unknown time  . ACCU-CHEK FASTCLIX LANCETS MISC    Past Week at Unknown time  . amoxicillin-clavulanate (AUGMENTIN) 875-125 MG per tablet Take 1 tablet by mouth every 12 (twelve) hours. (Patient not taking: Reported on 07/08/2015) 28 tablet 0 Not Taking at Unknown time  . BD INSULIN SYRINGE ULTRAFINE 31G X 15/64" 0.3 ML MISC   0 Past Week at Unknown time   Family History  Problem Relation Age of Onset  . Adopted: Yes  . Other Other     PT ADOPTED    Social History   Social History  . Marital Status: Single    Spouse Name: N/A  . Number of Children: Y  . Years of Education: N/A    Occupational History  . disabled truck driver    Social History Main Topics  . Smoking status: Former Smoker -- 1.00 packs/day for 20 years    Types: Cigarettes    Quit date: 07/13/1983  . Smokeless tobacco: Never Used  . Alcohol Use: No  . Drug Use: No  . Sexual Activity: Not Currently   Other Topics Concern  . Not on file   Social History Narrative   Pt is single.   Lives with sister.   Has children.   Was adopted.   Daily cafffiene-2 cups of coffee and 2 sodas per day           ROS:    As stated in the HPI and negative for all other systems.  Physical Exam: Blood pressure 114/55, pulse 95, temperature 97.8 F (36.6 C), temperature source Oral, resp. rate 19, height 6' (1.829 m), weight 374 lb 9 oz (169.9 kg), SpO2 98 %.  GENERAL:  Well appearing HEENT:  Pupils equal round and reactive, fundi not visualized, oral mucosa unremarkable NECK:  No jugular venous distention, waveform within normal limits, carotid upstroke brisk and symmetric, no bruits, no thyromegaly LYMPHATICS:  No cervical, inguinal adenopathy LUNGS:  Clear to auscultation bilaterally BACK:  No CVA tenderness CHEST:  Unremarkable HEART:  PMI not displaced or sustained,S1 and S2 within normal limits, no S3, no clicks, no rubs, Irregular murmurs ABD:  Flat, positive bowel sounds normal in frequency in pitch, no bruits, no rebound, no guarding, no midline pulsatile mass, no hepatomegaly, no splenomegaly, obese EXT:  2 plus pulses throughout, mild edema, no cyanosis no clubbing SKIN:  No rashes no nodules NEURO:  Cranial nerves II through XII grossly intact, motor grossly intact throughout PSYCH:  Cognitively intact, oriented to person place and time   Labs: Lab Results  Component Value Date   BUN 16 07/09/2015   Lab Results  Component Value Date  CREATININE 1.42* 07/09/2015   Lab Results  Component Value Date   NA 132* 07/09/2015   K 4.6 07/09/2015   CL 97* 07/09/2015   CO2 24 07/09/2015    Lab Results  Component Value Date   TROPONINI <0.03 07/09/2015   Lab Results  Component Value Date   WBC 10.8* 07/09/2015   HGB 12.2* 07/09/2015   HCT 38.2* 07/09/2015   MCV 79.6 07/09/2015   PLT 311 07/09/2015   Lab Results  Component Value Date   CHOL 258* 07/09/2015   HDL 32* 07/09/2015   LDLCALC UNABLE TO CALCULATE IF TRIGLYCERIDE OVER 400 mg/dL 02/40/9735   LDLDIRECT 82.1 08/31/2011   TRIG 406* 07/09/2015   CHOLHDL 8.1 07/09/2015     Radiology:   CXR: Stable cardiomegaly. Atherosclerotic calcifications again noted in the transverse aorta. Pulmonary vascular congestion bordering on mild interstitial edema is slightly improved compared to prior. No focal airspace consolidation, pleural effusion or pneumothorax. No acute osseous abnormality.  EKG:   Atrial fib, rate 126, IVCD, LAD. No acute ST T wave changes.  07/08/2015   ASSESSMENT AND PLAN:   ATRIAL FIB:  CHA2DS2-VASc Score 3.  At this point I don't see an advantage to cardioversion.  He does not notice that he is in this rhythm.  He is not coming to his warfarin checks and so we cannot be absolutely sure that he has been on therapeutic anticoagulation.  He reports that it is difficult for him to get to appointments.  In this situation I think Xarelto would be a better choice.  He reports no problem obtaining or taking his meds.  I will stop the amiodarone since we are not opting for rhythm control and he has not been coming to appointments for follow up of his labs.   For now the rate seems to be better on the Cardizem and we can consolidate this to CD before discharge.   I will stop the warfarin.   MEDICAL NON ADHERENCE:  The patient has cancelled or no showed for many Coumadin and clinic appointments.   I discussed this with the patient.    HTN:   The blood pressure is at target. No change in medications is indicated. We will continue with therapeutic lifestyle changes (TLC).  CHRONIC DIASTOLIC HF:   Seems to be  euvolemic.  No change in therapy.    CKD:    Stable.    SignedRollene Rotunda 07/09/2015, 2:10 PM

## 2015-07-09 NOTE — Progress Notes (Signed)
CRITICAL VALUE ALERT  Critical value received:  CBG >600 on glucometer  Date of notification:  07/09/2015  Time of notification:  1020  Critical value read back:yes  Nurse who received alert:  Aline August RN  MD notified (1st page):  Dr. Gwenlyn Perking  Time of first page:  1018  MD notified (2nd page):  Time of second page:  Responding MD:  Dr. Gwenlyn Perking  Time MD responded:  [phone]

## 2015-07-09 NOTE — Progress Notes (Signed)
Pharmacy consulted to switch patient from coumadin to xarelto for his afib. INR 2.08. Ok to begin xarelto once INR < 3. Will dose xarelto 20mg  daily for CrCl > 58ml/min. Case management consult ordered for cost.   45m, PharmD, BCPS 07/09/2015, 4:27 PM

## 2015-07-09 NOTE — Progress Notes (Signed)
Notified Dr. Gwenlyn Perking of glucose results.  Instructed to give 15units of SSI; will continue to monitor

## 2015-07-10 DIAGNOSIS — Z7901 Long term (current) use of anticoagulants: Secondary | ICD-10-CM

## 2015-07-10 LAB — CBC
HCT: 41.1 % (ref 39.0–52.0)
Hemoglobin: 13.2 g/dL (ref 13.0–17.0)
MCH: 25.4 pg — AB (ref 26.0–34.0)
MCHC: 32.1 g/dL (ref 30.0–36.0)
MCV: 79 fL (ref 78.0–100.0)
PLATELETS: 357 10*3/uL (ref 150–400)
RBC: 5.2 MIL/uL (ref 4.22–5.81)
RDW: 16 % — AB (ref 11.5–15.5)
WBC: 15.8 10*3/uL — ABNORMAL HIGH (ref 4.0–10.5)

## 2015-07-10 LAB — HEMOGLOBIN A1C
HEMOGLOBIN A1C: 10.5 % — AB (ref 4.8–5.6)
Mean Plasma Glucose: 255 mg/dL

## 2015-07-10 LAB — GLUCOSE, CAPILLARY
GLUCOSE-CAPILLARY: 129 mg/dL — AB (ref 65–99)
Glucose-Capillary: 308 mg/dL — ABNORMAL HIGH (ref 65–99)

## 2015-07-10 MED ORDER — RIVAROXABAN 20 MG PO TABS: 20.0000 mg | ORAL_TABLET | Freq: Every day | ORAL | Status: DC

## 2015-07-10 MED ORDER — DILTIAZEM HCL ER COATED BEADS 120 MG PO CP24: 120.0000 mg | ORAL_CAPSULE | Freq: Every day | ORAL | Status: DC

## 2015-07-10 MED ORDER — METHOCARBAMOL 500 MG PO TABS: 500.0000 mg | ORAL_TABLET | Freq: Three times a day (TID) | ORAL | Status: DC | PRN

## 2015-07-10 MED ORDER — INSULIN PEN NEEDLE 31G X 8 MM MISC: Status: DC

## 2015-07-10 MED ORDER — OXYCODONE HCL 30 MG PO TABS: 30.0000 mg | ORAL_TABLET | Freq: Four times a day (QID) | ORAL | Status: DC | PRN

## 2015-07-10 MED ORDER — INSULIN DETEMIR 100 UNIT/ML FLEXPEN: 20.0000 [IU] | PEN_INJECTOR | Freq: Every day | SUBCUTANEOUS | Status: DC

## 2015-07-10 NOTE — Care Management Note (Signed)
Case Management Note  Patient Details  Name: ZEKI BEDROSIAN MRN: [id number] Date of Birth: 03-Aug-1950  Subjective/Objective:       Adm w at fib             Action/Plan:lives w fam   Expected Discharge Date:                  Expected Discharge Plan:  Home/Self Care  In-House Referral:     Discharge planning Services  CM Consult, Medication Assistance  Post Acute Care Choice:    Choice offered to:     DME Arranged:    DME Agency:     HH Arranged:    HH Agency:     Status of Service:     Medicare Important Message Given:    Date Medicare IM Given:    Medicare IM give by:    Date Additional Medicare IM Given:    Additional Medicare Important Message give by:     If discussed at Long Length of Stay Meetings, dates discussed:    Additional Comments:gave pt 30day free xarelto card. Pt has medicaid for meds.  Hanley Hays, RN 07/10/2015, 10:25 AM

## 2015-07-10 NOTE — Progress Notes (Signed)
Pt called out for assistance to BR. Tech assisted pt to BR and pt became dizzy and fell on bottom. Pt did not sustain any injuries, but did scrape back on wall. MD notified. VSS. Educated pt importance on spacing out pain medication and getting assistance to bathroom at home. Post fall huddle performed. Will continue to monitor pt closely until discharge. Jillyn Hidden, RN

## 2015-07-10 NOTE — Discharge Summary (Signed)
Physician Discharge Summary  GRANITE DOWSETT ZJQ:[id number] DOB: 03-15-1951 DOA: 07/08/2015  PCP: [name], MD  Admit date: 07/08/2015 Discharge date: 07/10/2015  Time spent: 35 minutes  Recommendations for Outpatient Follow-up:  1. Repeat BMET to follow electrolytes and renal function 2. Reassess BP and adjust antihypertensive regimen as needed  3. Please check direct LDL and consider starting tricor for control of triglycerides  4. Close follow up of CBG's and A1C; further adjustments on his hypoglycemic regimen base on CBG's fluctuation    Discharge Diagnoses:  Principal Problem:   Atrial fibrillation with rapid ventricular response (HCC) Active Problems:   Diabetes mellitus type 2, uncontrolled, with complications (HCC)   Hyperlipidemia   OBESITY-MORBID BMI 54   Essential hypertension, benign   Atrial fibrillation- DCCV 2010   Obstructive sleep apnea- unable to tolerate c-pap   Coronary Artery Disease   Chronic diastolic congestive heart failure (HCC)   Chronic anticoagulation- Coumadin   CKD (chronic kidney disease) stage 3, GFR 30-59 ml/min   Atrial fibrillation with RVR (HCC)   Chest wall pain   Discharge Condition: stable and improved. Still with some discomfort on his chest wall, but better. Rate is controlled and the patient will be discharge home with outpatient follow up with PCP and cardiology service.  Diet recommendation: heart healthy and low carbohydrates   Filed Weights   07/09/15 0100  Weight: 169.9 kg (374 lb 9 oz)    History of present illness:  HPI: [name] is a 64 y.o. male with PMH of hypertension, hyperlipidemia, CAD, S/P of stent placement, diastolic congestive heart failure, PAF on Coumadin, obesity, OSA, back pain, chronic kidney disease-stage III, who presents with chest wall pain after fall. Pt reports that he fell on the chest wall while he did xmas decorations yesterday, then he developed chest wall pain. It is located in the  front lower chest, moderate. There is tenderness over the injury area. He does not have cough. He has mild shortness of breath which is at his baseline. No fever or chills. Patient does not have abdominal pain, diarrhea, nausea, vomiting, symptoms of UTI or unilateral weakness. In ED, patient was found to have atrial fibrillation with RVR, heart rate is up to 140, negative troponin 2, INR 2.45, temperature normal, tachycardia, stable renal function  Hospital Course:  Atrial fibrillation with rapid ventricular response (HCC): Heart rate is up to 140. Patient has chest wall pain. Initial trop is negative. CHA2DS2-VASc Score is 3; patient was on coumadin; but non-compliant with outpatient coumadin clinic visits and follow up. -Continue metoprolol and cardizem for rate control -per cardiology amiodarone and Coumadin discontinued -initiated on xarelto for anticoagulation -TSH WNL -cardiology will follow him as an outpatient   Chest wall pain: due to MSK injury. -troponin essentially neg and no ischemic changes seen on EKG -continue prn oxycodone and robaxin for pain  DM-II: Last A1c 9.6 on 02/13/14, poorly controled.  -Patient is taking Humulin at home; which would be resumed -also due to uncontrolled diabetes will initiate levemir 20 units daily to provide baseline coverage -patient advise to follow low carbohydrates diet -better sugar control will also help lowering his triglycerides levels ] -A1C during this admission 10.5  HLD:  -Continue home medications: Crestor -LDL unable to be calculated due to elevated triglycerides -will recommend direct LDL check as an outpatient and also initiation of tricor   Essential hypertension, benign: -stable and well controlled with current regimen -will continue home medication regimen; except for amlodipine, which has  been substituted by cardizem  Chronic diastolic congestive heart failure (HCC): 2-D echo on 12/05/14 showed EF of 50-55%. Patient is on  Lasix, lisinopril, B-blocker and spironolactone at home.  -CHF is compensated. -BNP 132.5 (patient is obese) -no signs of exacerbation appreciated -will continue home medication regimen   CKD (chronic kidney disease) stage 3: stable and at baseline - Follow-up renal function trend as an outpatient -advise to follow low sodium diet   Procedures:  See below for x-ray reports   Consultations:  Cardiology   Discharge Exam: Filed Vitals:   07/10/15 0724 07/10/15 0900  BP: 118/71   Pulse: 80 65  Temp: 97.6 F (36.4 C)   Resp: 17 19    General: Afebrile, still with some muscular discomfort on his chest; no nausea, no vomiting and no SOB or orthopnea.  Cardiovascular: rate is controled now (in the mid to high 80's the majority of the time); irregular, no rubs or gallops. Patient denies palpitaitons  Respiratory: CTA bilaterally  Abdomen: soft, NT, ND, positive BS  Musculoskeletal: trace to one plus edema bilaterally, no cyanosis   Discharge Instructions   Discharge Instructions    Diet - low sodium heart healthy    Complete by:  As directed      Discharge instructions    Complete by:  As directed   Take medications as prescribed Follow up with PCP in 10 days Arrange follow up with cardiology service in 2-3 weeks Follow low sodium and low carbohydrates diet Check your weight on daily basis          Current Discharge Medication List    START taking these medications   Details  diltiazem (CARDIZEM CD) 120 MG 24 hr capsule Take 1 capsule (120 mg total) by mouth daily. Qty: 30 capsule, Refills: 1    Insulin Detemir (LEVEMIR FLEXTOUCH) 100 UNIT/ML Pen Inject 20 Units into the skin daily. Qty: 15 mL, Refills: 11    Insulin Pen Needle 31G X 8 MM MISC Use to inject insulin daily as instructed Qty: 100 each, Refills: 3    methocarbamol (ROBAXIN) 500 MG tablet Take 1 tablet (500 mg total) by mouth every 8 (eight) hours as needed for muscle spasms. Qty: 40 tablet,  Refills: 0    rivaroxaban (XARELTO) 20 MG TABS tablet Take 1 tablet (20 mg total) by mouth daily with supper. Qty: 30 tablet, Refills: 1      CONTINUE these medications which have NOT CHANGED   Details  Ferrous Sulfate (IRON) 325 (65 FE) MG TABS Take 1 tablet by mouth daily.    furosemide (LASIX) 80 MG tablet Take 1 tablet (80 mg total) by mouth 2 (two) times daily. Qty: 180 tablet, Refills: 2    gabapentin (NEURONTIN) 300 MG capsule Take 1,200 mg by mouth 2 (two) times daily.     Hypromellose (NATURAL BALANCE TEARS OP) Place 1 drop into both eyes daily as needed.    insulin regular human CONCENTRATED (HUMULIN R) 500 UNIT/ML SOLN injection Inject 0.2 mLs (100 Units total) into the skin 2 (two) times daily with a meal. Qty: 20 mL, Refills: 0    lisinopril (PRINIVIL,ZESTRIL) 10 MG tablet Take 1 tablet (10 mg total) by mouth daily. Qty: 90 tablet, Refills: 2    metoprolol tartrate (LOPRESSOR) 25 MG tablet Take 1 tablet (25 mg total) by mouth 2 (two) times daily. Qty: 180 tablet, Refills: 2    Multiple Vitamin (MULTIVITAMIN WITH MINERALS) TABS Take 1 tablet by mouth daily.  oxycodone (ROXICODONE) 30 MG immediate release tablet Take 1 tablet (30 mg total) by mouth 4 (four) times daily. Qty: 20 tablet, Refills: 0    Podiatric Products (DIADERM FOOT REJUVENATING) CREA Apply 1 application topically daily as needed (dry skin).    potassium chloride SA (KLOR-CON M20) 20 MEQ tablet Take 1 tablet (20 mEq total) by mouth 2 (two) times daily. Qty: 60 tablet, Refills: 6    rosuvastatin (CRESTOR) 40 MG tablet Take 40 mg by mouth daily.    spironolactone (ALDACTONE) 25 MG tablet Take 1 tablet (25 mg total) by mouth daily. Qty: 90 tablet, Refills: 2    ACCU-CHEK AVIVA PLUS test strip Refills: 0    ACCU-CHEK FASTCLIX LANCETS MISC     BD INSULIN SYRINGE ULTRAFINE 31G X 15/64" 0.3 ML MISC Refills: 0      STOP taking these medications     amiodarone (PACERONE) 200 MG tablet       amLODipine (NORVASC) 5 MG tablet      warfarin (COUMADIN) 5 MG tablet      amoxicillin-clavulanate (AUGMENTIN) 875-125 MG per tablet        No Known Allergies Follow-up Information    Follow up with [name], MD. Schedule an appointment as soon as possible for a visit in 10 days.   Specialty:  Family Medicine   Contact information:   [address] [place] [address] [phone]       Follow up with Olga Millers, MD. Schedule an appointment as soon as possible for a visit in 2 weeks.   Specialty:  Cardiology   Why:  call office to arrange follow up appointment    Contact information:   [address] [place] [address] [phone]        The results of significant diagnostics from this hospitalization (including imaging, microbiology, ancillary and laboratory) are listed below for reference.    Significant Diagnostic Studies: Dg Chest 2 View  07/08/2015  CLINICAL DATA:  64 year old male with 1 day history of chest pain EXAM: CHEST  2 VIEW COMPARISON:  Prior chest x-ray 12/04/2014 FINDINGS: Stable cardiomegaly. Atherosclerotic calcifications again noted in the transverse aorta. Pulmonary vascular congestion bordering on mild interstitial edema is slightly improved compared to prior. No focal airspace consolidation, pleural effusion or pneumothorax. No acute osseous abnormality. IMPRESSION: Stable cardiomegaly and pulmonary vascular congestion without overt edema. Electronically Signed   By: Malachy Moan M.D.   On: 07/08/2015 14:47    Microbiology: Recent Results (from the past 240 hour(s))  MRSA PCR Screening     Status: None   Collection Time: 07/08/15 11:24 PM  Result Value Ref Range Status   MRSA by PCR NEGATIVE NEGATIVE Final    Comment:        The GeneXpert MRSA Assay (FDA approved for NASAL specimens only), is one component of a comprehensive MRSA colonization surveillance program. It is not intended to diagnose  MRSA infection nor to guide or monitor treatment for MRSA infections.      Labs: Basic Metabolic Panel:  Recent Labs Lab 07/08/15 1417 07/09/15 0650 07/09/15 1120  NA 138 132*  --   K 3.7 4.6  --   CL 102 97*  --   CO2 23 24  --   GLUCOSE 183* 525*  523* 691*  BUN 12 16  --   CREATININE 1.22 1.42*  --   CALCIUM 9.0 9.1  --    CBC:  Recent Labs Lab 07/08/15 1417 07/09/15  0650 07/10/15 0250  WBC 8.8 10.8* 15.8*  HGB 12.8* 12.2* 13.2  HCT 40.3 38.2* 41.1  MCV 79.2 79.6 79.0  PLT 284 311 357   Cardiac Enzymes:  Recent Labs Lab 07/08/15 2230 07/09/15 0238 07/09/15 0925  TROPONINI 0.06* <0.03 <0.03   BNP: BNP (last 3 results)  Recent Labs  12/04/14 1756 07/08/15 1417 07/09/15 0650  BNP 163.7* 78.4 132.5*   CBG:  Recent Labs Lab 07/09/15 1214 07/09/15 1425 07/09/15 1630 07/09/15 2008 07/10/15 0730  GLUCAP >600* 515* 304* 176* 129*    Signed:  [name] MD   [name] 07/10/2015, 10:15 AM

## 2015-07-10 NOTE — Progress Notes (Signed)
SUBJECTIVE:  Chest pain this AM musculoskeletal.  No acute SOB.  Ambulated with PT   PHYSICAL EXAM Filed Vitals:   07/10/15 0000 07/10/15 0025 07/10/15 0400 07/10/15 0724  BP:  107/64  118/71  Pulse: 66  70 80  Temp:  98.1 F (36.7 C) 98.2 F (36.8 C) 97.6 F (36.4 C)  TempSrc:  Oral Oral Oral  Resp: 16  9 17   Height:      Weight:      SpO2: 94%  93% 99%   General:  No distress Lungs:  Clear Heart:  Irregular Abdomen:  Positive bowel sounds, no rebound no guarding Extremities:  No edema  LABS: Lab Results  Component Value Date   TROPONINI <0.03 07/09/2015   Results for orders placed or performed during the hospital encounter of 07/08/15 (from the past 24 hour(s))  Troponin I (q 6hr x 3)     Status: None   Collection Time: 07/09/15  9:25 AM  Result Value Ref Range   Troponin I <0.03 <0.031 ng/mL  Glucose, capillary     Status: Abnormal   Collection Time: 07/09/15 10:16 AM  Result Value Ref Range   Glucose-Capillary >600 (HH) 65 - 99 mg/dL   Comment 1 Capillary Specimen    Comment 2 Call MD NNP PA CNM   Glucose, capillary     Status: Abnormal   Collection Time: 07/09/15 11:14 AM  Result Value Ref Range   Glucose-Capillary >600 (HH) 65 - 99 mg/dL   Comment 1 LAB TO DRAW   Glucose, random     Status: Abnormal   Collection Time: 07/09/15 11:20 AM  Result Value Ref Range   Glucose, Bld 691 (HH) 65 - 99 mg/dL  Glucose, capillary     Status: Abnormal   Collection Time: 07/09/15 12:14 PM  Result Value Ref Range   Glucose-Capillary >600 (HH) 65 - 99 mg/dL   Comment 1 Notify RN   Glucose, capillary     Status: Abnormal   Collection Time: 07/09/15  2:25 PM  Result Value Ref Range   Glucose-Capillary 515 (H) 65 - 99 mg/dL  Glucose, capillary     Status: Abnormal   Collection Time: 07/09/15  4:30 PM  Result Value Ref Range   Glucose-Capillary 304 (H) 65 - 99 mg/dL  Glucose, capillary     Status: Abnormal   Collection Time: 07/09/15  8:08 PM  Result Value Ref  Range   Glucose-Capillary 176 (H) 65 - 99 mg/dL   Comment 1 Capillary Specimen   CBC     Status: Abnormal   Collection Time: 07/10/15  2:50 AM  Result Value Ref Range   WBC 15.8 (H) 4.0 - 10.5 K/uL   RBC 5.20 4.22 - 5.81 MIL/uL   Hemoglobin 13.2 13.0 - 17.0 g/dL   HCT 07/12/15 27.0 - 35.0 %   MCV 79.0 78.0 - 100.0 fL   MCH 25.4 (L) 26.0 - 34.0 pg   MCHC 32.1 30.0 - 36.0 g/dL   RDW 09.3 (H) 81.8 - 29.9 %   Platelets 357 150 - 400 K/uL  Glucose, capillary     Status: Abnormal   Collection Time: 07/10/15  7:30 AM  Result Value Ref Range   Glucose-Capillary 129 (H) 65 - 99 mg/dL    Intake/Output Summary (Last 24 hours) at 07/10/15 0840 Last data filed at 07/10/15 0600  Gross per 24 hour  Intake   2646 ml  Output   4900 ml  Net  -2254 ml  ASSESSMENT AND PLAN:  ATRIAL FIB: CHA2DS2-VASc Score 3. Switched to [employer].  Plan for rate control and anticoagulation.   Rate seems to be well controlled.   Continue current therapy or switch to Cardizem CD at discharge.   HTN: The blood pressure is at target. No change in medications is indicated. We will continue with therapeutic lifestyle changes (TLC).  CHRONIC DIASTOLIC HF: Seems to be euvolemic. No change in therapy.  Good urine output in the past 24 hours.    CKD: Per primary team.  Follow BMETs.   Fayrene Fearing [facility] 07/10/2015 8:40 AM

## 2015-07-10 NOTE — Discharge Instructions (Signed)
Atrial Fibrillation °Atrial fibrillation is a type of irregular or rapid heartbeat (arrhythmia). In atrial fibrillation, the heart quivers continuously in a chaotic pattern. This occurs when parts of the heart receive disorganized signals that make the heart unable to pump blood normally. This can increase the risk for stroke, heart failure, and other heart-related conditions. There are different types of atrial fibrillation, including: °· Paroxysmal atrial fibrillation. This type starts suddenly, and it usually stops on its own shortly after it starts. °· Persistent atrial fibrillation. This type often lasts longer than a week. It may stop on its own or with treatment. °· Long-lasting persistent atrial fibrillation. This type lasts longer than 12 months. °· Permanent atrial fibrillation. This type does not go away. °Talk with your health care provider to learn about the type of atrial fibrillation that you have. °CAUSES °This condition is caused by some heart-related conditions or procedures, including: °· A heart attack. °· Coronary artery disease. °· Heart failure. °· Heart valve conditions. °· High blood pressure. °· Inflammation of the sac that surrounds the heart (pericarditis). °· Heart surgery. °· Certain heart rhythm disorders, such as Wolf-Parkinson-White syndrome. °Other causes include: °· Pneumonia. °· Obstructive sleep apnea. °· Blockage of an artery in the lungs (pulmonary embolism, or PE). °· Lung cancer. °· Chronic lung disease. °· Thyroid problems, especially if the thyroid is overactive (hyperthyroidism). °· Caffeine. °· Excessive alcohol use or illegal drug use. °· Use of some medicines, including certain decongestants and diet pills. °Sometimes, the cause cannot be found. °RISK FACTORS °This condition is more likely to develop in: °· People who are older in age. °· People who smoke. °· People who have diabetes mellitus. °· People who are overweight (obese). °· Athletes who exercise  vigorously. °SYMPTOMS °Symptoms of this condition include: °· A feeling that your heart is beating rapidly or irregularly. °· A feeling of discomfort or pain in your chest. °· Shortness of breath. °· Sudden light-headedness or weakness. °· Getting tired easily during exercise. °In some cases, there are no symptoms. °DIAGNOSIS °Your health care provider may be able to detect atrial fibrillation when taking your pulse. If detected, this condition may be diagnosed with: °· An electrocardiogram (ECG). °· A Holter monitor test that records your heartbeat patterns over a 24-hour period. °· Transthoracic echocardiogram (TTE) to evaluate how blood flows through your heart. °· Transesophageal echocardiogram (TEE) to view more detailed images of your heart. °· A stress test. °· Imaging tests, such as a CT scan or chest X-ray. °· Blood tests. °TREATMENT °The main goals of treatment are to prevent blood clots from forming and to keep your heart beating at a normal rate and rhythm. The type of treatment that you receive depends on many factors, such as your underlying medical conditions and how you feel when you are experiencing atrial fibrillation. °This condition may be treated with: °· Medicine to slow down the heart rate, bring the heart's rhythm back to normal, or prevent clots from forming. °· Electrical cardioversion. This is a procedure that resets your heart's rhythm by delivering a controlled, low-energy shock to the heart through your skin. °· Different types of ablation, such as catheter ablation, catheter ablation with pacemaker, or surgical ablation. These procedures destroy the heart tissues that send abnormal signals. When the pacemaker is used, it is placed under your skin to help your heart beat in a regular rhythm. °HOME CARE INSTRUCTIONS °· Take over-the counter and prescription medicines only as told by your health care provider. °·   If your health care provider prescribed a blood-thinning medicine  (anticoagulant), take it exactly as told. Taking too much blood-thinning medicine can cause bleeding. If you do not take enough blood-thinning medicine, you will not have the protection that you need against stroke and other problems.  Do not use tobacco products, including cigarettes, chewing tobacco, and e-cigarettes. If you need help quitting, ask your health care provider.  If you have obstructive sleep apnea, manage your condition as told by your health care provider.  Do not drink alcohol.  Do not drink beverages that contain caffeine, such as coffee, soda, and tea.  Maintain a healthy weight. Do not use diet pills unless your health care provider approves. Diet pills may make heart problems worse.  Follow diet instructions as told by your health care provider.  Exercise regularly as told by your health care provider.  Keep all follow-up visits as told by your health care provider. This is important. PREVENTION  Avoid drinking beverages that contain caffeine or alcohol.  Avoid certain medicines, especially medicines that are used for breathing problems.  Avoid certain herbs and herbal medicines, such as those that contain ephedra or ginseng.  Do not use illegal drugs, such as cocaine and amphetamines.  Do not smoke.  Manage your high blood pressure. SEEK MEDICAL CARE IF:  You notice a change in the rate, rhythm, or strength of your heartbeat.  You are taking an anticoagulant and you notice increased bruising.  You tire more easily when you exercise or exert yourself. SEEK IMMEDIATE MEDICAL CARE IF:  You have chest pain, abdominal pain, sweating, or weakness.  You feel nauseous.  You notice blood in your vomit, bowel movement, or urine.  You have shortness of breath.  You suddenly have swollen feet and ankles.  You feel dizzy.  You have sudden weakness or numbness of the face, arm, or leg, especially on one side of the body.  You have trouble speaking,  trouble understanding, or both (aphasia).  Your face or your eyelid droops on one side. These symptoms may represent a serious problem that is an emergency. Do not wait to see if the symptoms will go away. Get medical help right away. Call your local emergency services (911 in the [place] Do not drive yourself to the hospital.   This information is not intended to replace advice given to you by your health care provider. Make sure you discuss any questions you have with your health care provider.   Document Released: 06/28/2005 Document Revised: 03/19/2015 Document Reviewed: 10/23/2014 Elsevier Interactive Patient Education 2016 [employer].   Information on my medicine - XARELTO (Rivaroxaban)  This medication education was reviewed with me or my healthcare representative as part of my discharge preparation.  The pharmacist that spoke with me during my hospital stay was:  Severiano Gilbert, [facility]  Why was Xarelto prescribed for you? Xarelto was prescribed for you to reduce the risk of a blood clot forming that can cause a stroke if you have a medical condition called atrial fibrillation (a type of irregular heartbeat).  What do you need to know about xarelto ? Take your Xarelto ONCE DAILY at the same time every day with your evening meal. If you have difficulty swallowing the tablet whole, you may crush it and mix in applesauce just prior to taking your dose.  Take Xarelto exactly as prescribed by your doctor and DO NOT stop taking Xarelto without talking to the doctor who prescribed the medication.  Stopping without  other stroke prevention medication to take the place of Xarelto may increase your risk of developing a clot that causes a stroke.  Refill your prescription before you run out.  After discharge, you should have regular check-up appointments with your healthcare provider that is prescribing your Xarelto.  In the future your dose may need to be changed if your kidney  function or weight changes by a significant amount.  What do you do if you miss a dose? If you are taking Xarelto ONCE DAILY and you miss a dose, take it as soon as you remember on the same day then continue your regularly scheduled once daily regimen the next day. Do not take two doses of Xarelto at the same time or on the same day.   Important Safety Information A possible side effect of Xarelto is bleeding. You should call your healthcare provider right away if you experience any of the following: ? Bleeding from an injury or your nose that does not stop. ? Unusual colored urine (red or dark brown) or unusual colored stools (red or black). ? Unusual bruising for unknown reasons. ? A serious fall or if you hit your head (even if there is no bleeding).  Some medicines may interact with Xarelto and might increase your risk of bleeding while on Xarelto. To help avoid this, consult your healthcare provider or pharmacist prior to using any new prescription or non-prescription medications, including herbals, vitamins, non-steroidal anti-inflammatory drugs (NSAIDs) and supplements.  This website has more information on Xarelto: VisitDestination.com.br.

## 2015-07-10 NOTE — Progress Notes (Signed)
Pt being discharged home via wheelchair with family. Pt alert and oriented x4. VSS. Pt c/o no pain at this time. No signs of respiratory distress. Education complete and care plans resolved. IVx2 removed with catheter intact and pt tolerated well. No further issues at this time. Pt to follow up with PCP. Plumer Mittelstaedt R, RN 

## 2015-07-10 NOTE — Significant Event (Signed)
Discharge instruction reviewed with patient. A copy of AVS given to patient. Patient requesting prescription for oxycodone IR. Made this request aware to Dr. Gwenlyn Perking. Patient also stated that he has tried to call his sister for transportation but not able to reach her. Will discharge patient once transportation is here.

## 2015-07-11 ENCOUNTER — Emergency Department (HOSPITAL_COMMUNITY): Payer: Medicare Other

## 2015-07-11 ENCOUNTER — Encounter (HOSPITAL_COMMUNITY): Payer: Self-pay | Admitting: Emergency Medicine

## 2015-07-11 ENCOUNTER — Emergency Department (HOSPITAL_COMMUNITY)
Admission: EM | Admit: 2015-07-11 | Discharge: 2015-07-12 | Disposition: A | Discharge status: Home / Self Care | Payer: Medicare Other | Attending: Emergency Medicine | Admitting: Emergency Medicine

## 2015-07-11 DIAGNOSIS — S2231XA Fracture of one rib, right side, initial encounter for closed fracture: Secondary | ICD-10-CM | POA: Insufficient documentation

## 2015-07-11 DIAGNOSIS — G629 Polyneuropathy, unspecified: Secondary | ICD-10-CM | POA: Diagnosis not present

## 2015-07-11 DIAGNOSIS — W01198A Fall on same level from slipping, tripping and stumbling with subsequent striking against other object, initial encounter: Secondary | ICD-10-CM | POA: Insufficient documentation

## 2015-07-11 DIAGNOSIS — Y92231 Patient bathroom in hospital as the place of occurrence of the external cause: Secondary | ICD-10-CM | POA: Diagnosis not present

## 2015-07-11 DIAGNOSIS — E785 Hyperlipidemia, unspecified: Secondary | ICD-10-CM | POA: Diagnosis not present

## 2015-07-11 DIAGNOSIS — Z87891 Personal history of nicotine dependence: Secondary | ICD-10-CM | POA: Diagnosis not present

## 2015-07-11 DIAGNOSIS — I5032 Chronic diastolic (congestive) heart failure: Secondary | ICD-10-CM | POA: Diagnosis not present

## 2015-07-11 DIAGNOSIS — Z7901 Long term (current) use of anticoagulants: Secondary | ICD-10-CM | POA: Insufficient documentation

## 2015-07-11 DIAGNOSIS — E119 Type 2 diabetes mellitus without complications: Secondary | ICD-10-CM | POA: Diagnosis not present

## 2015-07-11 DIAGNOSIS — R52 Pain, unspecified: Secondary | ICD-10-CM

## 2015-07-11 DIAGNOSIS — Y9389 Activity, other specified: Secondary | ICD-10-CM | POA: Diagnosis not present

## 2015-07-11 DIAGNOSIS — I129 Hypertensive chronic kidney disease with stage 1 through stage 4 chronic kidney disease, or unspecified chronic kidney disease: Secondary | ICD-10-CM | POA: Diagnosis not present

## 2015-07-11 DIAGNOSIS — G8929 Other chronic pain: Secondary | ICD-10-CM | POA: Insufficient documentation

## 2015-07-11 DIAGNOSIS — S300XXA Contusion of lower back and pelvis, initial encounter: Secondary | ICD-10-CM | POA: Insufficient documentation

## 2015-07-11 DIAGNOSIS — S3992XA Unspecified injury of lower back, initial encounter: Secondary | ICD-10-CM | POA: Diagnosis present

## 2015-07-11 DIAGNOSIS — Z794 Long term (current) use of insulin: Secondary | ICD-10-CM | POA: Insufficient documentation

## 2015-07-11 DIAGNOSIS — Z872 Personal history of diseases of the skin and subcutaneous tissue: Secondary | ICD-10-CM | POA: Diagnosis not present

## 2015-07-11 DIAGNOSIS — Y998 Other external cause status: Secondary | ICD-10-CM | POA: Insufficient documentation

## 2015-07-11 DIAGNOSIS — D649 Anemia, unspecified: Secondary | ICD-10-CM | POA: Insufficient documentation

## 2015-07-11 DIAGNOSIS — N183 Chronic kidney disease, stage 3 (moderate): Secondary | ICD-10-CM | POA: Diagnosis not present

## 2015-07-11 DIAGNOSIS — I480 Paroxysmal atrial fibrillation: Secondary | ICD-10-CM | POA: Diagnosis not present

## 2015-07-11 DIAGNOSIS — Z9889 Other specified postprocedural states: Secondary | ICD-10-CM | POA: Insufficient documentation

## 2015-07-11 DIAGNOSIS — Z9861 Coronary angioplasty status: Secondary | ICD-10-CM | POA: Insufficient documentation

## 2015-07-11 DIAGNOSIS — Z8739 Personal history of other diseases of the musculoskeletal system and connective tissue: Secondary | ICD-10-CM | POA: Insufficient documentation

## 2015-07-11 DIAGNOSIS — Z79899 Other long term (current) drug therapy: Secondary | ICD-10-CM | POA: Insufficient documentation

## 2015-07-11 MED ORDER — HYDROMORPHONE HCL 1 MG/ML IJ SOLN
1.0000 mg | Freq: Once | INTRAMUSCULAR | Status: AC
  Administered 2015-07-11: 1 mg via INTRAVENOUS
  Filled 2015-07-11: qty 1

## 2015-07-11 MED ORDER — LIDOCAINE 5 % EX PTCH
1.0000 | MEDICATED_PATCH | CUTANEOUS | Status: DC
  Administered 2015-07-11: 1 via TRANSDERMAL
  Filled 2015-07-11: qty 1

## 2015-07-11 MED ORDER — DIAZEPAM 5 MG/ML IJ SOLN
5.0000 mg | Freq: Once | INTRAMUSCULAR | Status: AC
  Administered 2015-07-11: 5 mg via INTRAVENOUS
  Filled 2015-07-11: qty 2

## 2015-07-11 NOTE — ED Notes (Signed)
MD at bedside. 

## 2015-07-11 NOTE — ED Notes (Signed)
Pt. reports right lower back pain onset yesterday after a fall in the bathroom , denies hematuria , ambulatory / no LOC .

## 2015-07-11 NOTE — ED Provider Notes (Signed)
CSN: [id number]     Arrival date & time 07/11/15  2003 History   First MD Initiated Contact with Patient 07/11/15 2152     Chief Complaint  Patient presents with  . Back Pain     (Consider location/radiation/quality/duration/timing/severity/associated sxs/prior Treatment) HPI   Blood pressure 149/83, pulse 120, temperature 98.5 F (36.9 C), temperature source Oral, resp. rate 18, SpO2 98 %.  [name] is a 64 y.o. male with past medical history significant for morbid obesity, CHF, CAD, recently admitted to the hospital for A. fib with RVR had a mechanical fall 2 days ago in the hospital and states he fell backward and hit a metal handrail in the restroom on th`e right lower back. He states that initially the pain was not severe, no imaging was done while he was in the hospital but the pain has gotten uncontrollable since he was discharged yesterday. States he is ambulatory but with pain. He states he is taking oxycodone 30 mg which he takes for his chronic low back pain, also states that when he was discharged she was supposed to be given a prescription for oxycodone however it was not in his packet. States his medications are typically prescribed by pain management but he is out, the doctor is closed on Friday and he has no one on call. 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   Past Medical History  Diagnosis Date  . Hypertension   . Chronic diastolic CHF (congestive heart failure) (HCC)   . CAD (coronary artery disease)     a. s/p PCI to RCA in 2012. b. prior cath in 01/2014 with elevated L/RH pressures, mild-mod CAD of LAD/LCx with patent RCA, normal EF, c/b CIN/CHF.  [name] PAF (paroxysmal atrial fibrillation) (HCC)     TEE DCCV 09/23/2014  . Anemia   . Pilonidal cyst 1980's; 01/25/2013  . Neuropathy (HCC)   . History of blood transfusion ~ 1954    "related to  OR"  . Carpal tunnel syndrome, bilateral   . Morbid obesity (HCC)   . Hyperlipidemia   . OSA (obstructive sleep apnea)     "I wear nasal prongs; haven't been using prongs recently" (09/19/2014)  . Type II diabetes mellitus (HCC)   . Arthritis     "hands and lower back" (09/19/2014)  . Chronic lower back pain   . Chronic kidney disease (CKD), stage III (moderate)   . Cellulitis   . Hypoxia     a. Qualified for home O2 at [facility] in 09/2014.  [name] Physical deconditioning    Past Surgical History  Procedure Laterality Date  . Abdominal surgery  ~ 1954    BENIGN TUMOR REMOVED  . Debridement  foot Left     debriding diabetic foot ulcers  . Cataract extraction w/phaco Right 11/15/2012    Procedure: CATARACT EXTRACTION PHACO AND INTRAOCULAR LENS PLACEMENT (IOC);  Surgeon: Shade Flood, MD;  Location: [facility] OR;  Service: Ophthalmology;  Laterality: Right;  . Cataract extraction w/phaco Left 11/29/2012    Procedure: CATARACT EXTRACTION PHACO AND INTRAOCULAR LENS PLACEMENT (IOC);  Surgeon: Shade Flood, MD;  Location: [facility] OR;  Service: Ophthalmology;  Laterality: Left;  . Pilonidal cyst excision N/A 01/08/2013    Procedure: CYST EXCISION PILONIDAL EXTENSIVE;  Surgeon: Axel Filler, MD;  Location: [facility] OR;  Service: General;  Laterality: N/A;  . Foreign body removal Right  2014    heel,  splinter removal   . Pars plana vitrectomy Left 06/05/2013    Procedure: PARS PLANA VITRECTOMY WITH 23 GAUGE with Endolaser(constellation);  Surgeon: Shade Flood, MD;  Location: [facility] OR;  Service: Ophthalmology;  Laterality: Left;  . Left and right heart catheterization with coronary angiogram N/A 01/31/2014    Procedure: LEFT AND RIGHT HEART CATHETERIZATION WITH CORONARY ANGIOGRAM;  Surgeon: Micheline Chapman, MD;  Location: [facility] CATH LAB;  Service: Cardiovascular;  Laterality: N/A;  . Tonsillectomy    . Appendectomy    . Eye surgery    . Coronary angioplasty with stent placement  August 2012    RCA DES - [facility]  . Cardioversion  2010    [name] 09/19/2014  . Pilonidal cyst excision  1980's    "in [place]"  . Tee without cardioversion N/A 09/23/2014    Procedure: TRANSESOPHAGEAL ECHOCARDIOGRAM (TEE);  Surgeon: Lewayne Bunting, MD;  Location: [facility] ENDOSCOPY;  Service: Cardiovascular;  Laterality: N/A;  . Cardioversion N/A 09/23/2014    Procedure: CARDIOVERSION;  Surgeon: Lewayne Bunting, MD;  Location: [facility] ENDOSCOPY;  Service: Cardiovascular;  Laterality: N/A;  . Cardioversion N/A 12/11/2014    Procedure: CARDIOVERSION;  Surgeon: Thurmon Fair, MD;  Location: [facility] ENDOSCOPY;  Service: Cardiovascular;  Laterality: N/A;   Family History  Problem Relation Age of Onset  . Adopted: Yes  . Other Other     PT ADOPTED   Social History  Substance Use Topics  . Smoking status: Former Smoker -- 1.00 packs/day for 20 years    Types: Cigarettes    Quit date: 07/13/1983  . Smokeless tobacco: Never Used  . Alcohol Use: No    Review of Systems  10 systems reviewed and found to be negative, except as noted in the HPI.   Allergies  Review of patient's allergies indicates no known allergies.  Home Medications   Prior to Admission medications   Medication Sig Start Date End Date Taking? Authorizing Provider  diltiazem (CARDIZEM CD) 120 MG 24 hr capsule Take 1 capsule (120 mg total) by mouth daily. 07/10/15  Yes [name], MD  Ferrous Sulfate (IRON) 325 (65 FE) MG TABS Take 1 tablet by mouth daily.   Yes Historical Provider, MD  furosemide (LASIX) 80 MG tablet Take 1 tablet (80 mg total) by mouth 2 (two) times daily. 04/09/15  Yes Lewayne Bunting, MD  gabapentin (NEURONTIN) 300 MG capsule Take 1,200 mg by mouth 2 (two) times daily.    Yes Historical Provider, MD  Hypromellose (NATURAL BALANCE TEARS OP) Place 1 drop into both eyes daily as needed.   Yes Historical Provider, MD  Insulin Detemir (LEVEMIR FLEXTOUCH) 100 UNIT/ML Pen Inject 20 Units into the skin daily. 07/10/15  Yes [name], MD  insulin regular human CONCENTRATED (HUMULIN R) 500 UNIT/ML SOLN injection Inject 0.2 mLs (100 Units total) into the skin 2 (two) times daily with a meal. Patient taking differently: Inject 50-100 Units into the skin 3 (three) times daily before meals. Blood sugar >200 = 100 units; blood sugar <200 = 50 units 12/11/14  Yes Rodolph Bong, MD  lisinopril (PRINIVIL,ZESTRIL) 10 MG tablet Take 1 tablet (10 mg total) by mouth daily. 04/09/15  Yes Lewayne Bunting, MD  metoprolol tartrate (LOPRESSOR) 25 MG tablet Take 1 tablet (25 mg total) by mouth 2 (two) times daily. 04/09/15  Yes Lewayne Bunting, MD  Multiple Vitamin (MULTIVITAMIN WITH MINERALS) TABS Take 1 tablet by mouth  daily.   Yes Historical Provider, MD  Podiatric Products (DIADERM FOOT REJUVENATING) CREA Apply 1 application topically daily as needed (dry skin).   Yes Historical Provider, MD  potassium chloride SA (KLOR-CON M20) 20 MEQ tablet Take 1 tablet (20 mEq total) by mouth 2 (two) times daily. Patient taking differently: Take 20 mEq by mouth daily.  11/27/14  Yes Dione Booze, MD  rivaroxaban (XARELTO) 20 MG TABS tablet Take 1 tablet (20 mg total) by mouth daily with supper. 07/10/15  Yes [name], MD  rosuvastatin (CRESTOR) 40 MG tablet Take 40 mg by mouth daily.   Yes Historical Provider, MD  spironolactone (ALDACTONE) 25 MG tablet Take 1 tablet (25 mg total) by mouth daily. 04/09/15  Yes Lewayne Bunting, MD  ACCU-CHEK AVIVA PLUS test strip  08/30/14   Historical Provider, MD  ACCU-CHEK FASTCLIX LANCETS MISC  08/17/13   Historical Provider, MD  BD INSULIN SYRINGE ULTRAFINE 31G X 15/64" 0.3 ML MISC  08/19/14   Historical Provider, MD  Insulin Pen Needle 31G X 8 MM MISC Use to inject insulin daily as instructed 07/10/15   [name], MD  methocarbamol (ROBAXIN) 500 MG tablet Take 1 tablet (500 mg total) by mouth every 8 (eight) hours as needed for muscle spasms. 07/10/15   [name], MD  oxycodone (ROXICODONE) 30 MG  immediate release tablet Take 1 tablet (30 mg total) by mouth every 6 (six) hours as needed (seevre pain). 07/12/15   Lalani Winkles, PA-C   BP 148/73 mmHg  Pulse 119  Temp(Src) 98.5 F (36.9 C) (Oral)  Resp 22  SpO2 99% Physical Exam  Constitutional: He is oriented to person, place, and time. He appears well-developed and well-nourished. No distress.  HENT:  Head: Normocephalic.  Eyes: Conjunctivae and EOM are normal.  Cardiovascular: Normal rate.   Pulmonary/Chest: Effort normal. No stridor.  Musculoskeletal: Normal range of motion.  Neurological: He is alert and oriented to person, place, and time.  Skin:     Psychiatric: He has a normal mood and affect.  Nursing note and vitals reviewed.   ED Course  Procedures (including critical care time) Labs Review Labs Reviewed  URINALYSIS, ROUTINE W REFLEX MICROSCOPIC (NOT AT [facility]) - Abnormal; Notable for the following:    APPearance CLOUDY (*)    Glucose, UA >1000 (*)    Ketones, ur 15 (*)    All other components within normal limits  URINE MICROSCOPIC-ADD ON - Abnormal; Notable for the following:    Squamous Epithelial / LPF 0-5 (*)    All other components within normal limits    Imaging Review Ct Abdomen Pelvis Wo Contrast  07/12/2015  CLINICAL DATA:  64 year old male with fall concern for retroperitoneal bleed. Patient is line at the correlation treatment. EXAM: CT ABDOMEN AND PELVIS WITHOUT CONTRAST TECHNIQUE: Multidetector CT imaging of the abdomen and pelvis was performed following the standard protocol without IV contrast. COMPARISON:  Lumbar spine radiograph dated 07/11/2015 and pelvic CT dated 09/06/2012 FINDINGS: Evaluation of this exam is limited in the absence of intravenous contrast. The visualized lung bases are clear. There is coronary vascular calcification. No intra-abdominal free air or free fluid. The liver, gallbladder, pancreas, spleen, and the right adrenal gland appear unremarkable. A 3 cm left adrenal  adenoma. There is no hydronephrosis or nephrolithiasis on either side. The urinary bladder is collapsed. The prostate and seminal vesicles are grossly unremarkable. There is scattered sigmoid diverticula without active inflammation. Moderate stool throughout the colon. No evidence of bowel obstruction  or inflammation. Appendectomy. Mild aortoiliac atherosclerotic disease. No abdominal aortic aneurysm. No portal venous gas identified. There is no adenopathy. Nodular subcutaneous soft tissues in the anterior abdominal wall likely related to recent subcutaneous injections. No fluid collection or hematoma. There is degenerative changes of the spine. There is nondisplaced fracture of the right posterior eleventh rib. There is displaced fracture of the right posterior twelfth rib which is age indeterminate. Clinical correlation is recommended. No other acute fracture identified. IMPRESSION: Right posterior eleventh and twelfth rib fractures. No other acute/traumatic intra-abdominal or pelvic pathology identified. No intra-abdominal, retroperitoneal, or abdominal wall hematoma. Electronically Signed   By: Elgie Collard M.D.   On: 07/12/2015 00:35   Dg Lumbar Spine Complete  07/11/2015  CLINICAL DATA:  64 year old male with fall and right hip and lower back pain. EXAM: LUMBAR SPINE - COMPLETE 4+ VIEW COMPARISON:  None. FINDINGS: Evaluation is limited due to osteopenia and degenerative changes. No definite acute fracture or subluxation identified. Mild T12 compression deformity, likely old. The visualized transverse and spinous processes appear intact. The soft tissues are unremarkable. IMPRESSION: No definite acute/ traumatic lumbar spine pathology. Electronically Signed   By: Elgie Collard M.D.   On: 07/11/2015 23:44   Dg Hip Unilat With Pelvis 2-3 Views Right  07/11/2015  CLINICAL DATA:  Larey Seat July 11, 2015 in shower.  RIGHT hip pain. EXAM: DG HIP (WITH OR WITHOUT PELVIS) 2-3V RIGHT COMPARISON:  None.  FINDINGS: Femoral heads are well formed and located. Hip joint spaces are intact. Sacroiliac joints are symmetric. No destructive bony lesions. Large body habitus. Probable bone islands versus enteric contents projecting over the RIGHT iliac bone. Included soft tissue planes are non-suspicious. IMPRESSION: Negative. Electronically Signed   By: Awilda Metro M.D.   On: 07/11/2015 23:41   I have personally reviewed and evaluated these images and lab results as part of my medical decision-making.   EKG Interpretation None      MDM   Final diagnoses:  Pain  Right rib fracture, closed, initial encounter    Filed Vitals:   07/11/15 2215 07/11/15 2315 07/12/15 0000 07/12/15 0050  BP: 149/83 152/69 165/69 148/73  Pulse: 120 116 118 119  Temp:      TempSrc:      Resp: 18   22  SpO2: 98% 95% 97% 99%    Medications  lidocaine (LIDODERM) 5 % 1 patch (1 patch Transdermal Patch Applied 07/11/15 2355)  HYDROmorphone (DILAUDID) injection 1 mg (1 mg Intravenous Given 07/11/15 2234)  diazepam (VALIUM) injection 5 mg (5 mg Intravenous Given 07/11/15 2233)  HYDROmorphone (DILAUDID) injection 1 mg (1 mg Intravenous Given 07/11/15 2356)  HYDROmorphone (DILAUDID) injection 2 mg (2 mg Intravenous Given 07/12/15 0047)    [name] is 64 y.o. male presenting with severe  flank pain following a fall 2 days ago. He has an ecchymoses to the area, anticoagulated. Will CT to evaluate for retroperitoneal hematoma. UA to look for hematuria.  This is a shared visit with the attending physician who personally evaluated the patient and agrees with the care plan.   Urinalysis with no hematuria, CT abdomen pelvis with no retroperitoneal hematoma however, we do see  nondisplaced right posterior rib fractures in the area of this patient's pain.  Pt will be given  Rx for 30 mg oxycodone QID disp # 23 and counseled in incentive spirometry  Evaluation does not show pathology that would require ongoing  emergent intervention or inpatient treatment. Pt is hemodynamically stable and mentating  appropriately. Discussed findings and plan with patient/guardian, who agrees with care plan. All questions answered. Return precautions discussed and outpatient follow up given.      Wynetta Emery, PA-C 07/12/15 1610  Doug Sou, MD 07/12/15 [phone]

## 2015-07-12 ENCOUNTER — Emergency Department (HOSPITAL_COMMUNITY): Payer: Medicare Other

## 2015-07-12 DIAGNOSIS — S300XXA Contusion of lower back and pelvis, initial encounter: Secondary | ICD-10-CM | POA: Diagnosis not present

## 2015-07-12 LAB — URINALYSIS, ROUTINE W REFLEX MICROSCOPIC
Bilirubin Urine: NEGATIVE
Hgb urine dipstick: NEGATIVE
KETONES UR: 15 mg/dL — AB
LEUKOCYTES UA: NEGATIVE
NITRITE: NEGATIVE
PROTEIN: NEGATIVE mg/dL
Specific Gravity, Urine: 1.019 (ref 1.005–1.030)
pH: 6.5 (ref 5.0–8.0)

## 2015-07-12 LAB — URINE MICROSCOPIC-ADD ON
Bacteria, UA: NONE SEEN
RBC / HPF: NONE SEEN RBC/hpf (ref 0–5)
WBC UA: NONE SEEN WBC/hpf (ref 0–5)

## 2015-07-12 MED ORDER — OXYCODONE HCL 30 MG PO TABS: 30.0000 mg | ORAL_TABLET | Freq: Four times a day (QID) | ORAL | Status: DC | PRN

## 2015-07-12 MED ORDER — HYDROMORPHONE HCL 1 MG/ML IJ SOLN
2.0000 mg | Freq: Once | INTRAMUSCULAR | Status: AC
  Administered 2015-07-12: 2 mg via INTRAVENOUS
  Filled 2015-07-12: qty 2

## 2015-07-12 NOTE — ED Notes (Signed)
RT at bedside doing Incentive Spirometry education

## 2015-07-12 NOTE — Discharge Instructions (Signed)
Take oxycodone for breakthrough pain, do not drink alcohol, drive, care for children or do other critical tasks while taking oxycodone.  It is very important that you take deep breaths to prevent lung collapse and infection.  Either use your incentive spirometer or take 10 deep breaths every hour to prevent lung collapse.  If you develop cough, fever or shortness of breath return immediately to the emergency room.

## 2015-07-12 NOTE — ED Notes (Signed)
RT called to do Incentive Spirometry education with patient.

## 2015-07-12 NOTE — ED Provider Notes (Addendum)
complains of right sided paralumbar pain since fall several days ago while in the hospital. He denies chest pain denies abdominal pain. On exam patient appears uncomfortable. He has right-sided paralumbar tenderness at site of grapefruit-sized purplish ecchymosis. Abdomen morbidly obese, nontender. Moves all extremities well  Doug Sou, MD 07/12/15 8299  Doug Sou, MD 07/12/15 [phone]

## 2015-07-16 ENCOUNTER — Telehealth: Payer: Self-pay | Admitting: Cardiology

## 2015-07-16 NOTE — Telephone Encounter (Signed)
Pt advice relayed to case manager regarding medication changes and prescriptions.

## 2015-07-16 NOTE — Telephone Encounter (Signed)
Message.

## 2015-07-17 ENCOUNTER — Telehealth: Payer: Self-pay

## 2015-07-17 ENCOUNTER — Telehealth: Payer: Self-pay | Admitting: *Deleted

## 2015-07-17 MED ORDER — RIVAROXABAN 20 MG PO TABS: 20.0000 mg | ORAL_TABLET | Freq: Every day | ORAL | Status: DC

## 2015-07-17 NOTE — Telephone Encounter (Signed)
Prior auth for Xarelto 20mg submitted to [facility]. 

## 2015-07-17 NOTE — Telephone Encounter (Signed)
Med refilled at pt's request 

## 2015-07-21 ENCOUNTER — Telehealth: Payer: Self-pay

## 2015-07-21 NOTE — Telephone Encounter (Signed)
Xarelto approved through 07/11/2016. PA # [id number].

## 2015-07-23 ENCOUNTER — Ambulatory Visit: Payer: Medicare Other | Admitting: Podiatry

## 2015-07-30 ENCOUNTER — Ambulatory Visit: Payer: Medicare Other | Admitting: Physician Assistant

## 2015-08-13 ENCOUNTER — Ambulatory Visit (INDEPENDENT_AMBULATORY_CARE_PROVIDER_SITE_OTHER): Payer: Medicare Other | Admitting: Podiatry

## 2015-08-13 ENCOUNTER — Encounter: Payer: Self-pay | Admitting: Podiatry

## 2015-08-13 DIAGNOSIS — B351 Tinea unguium: Secondary | ICD-10-CM

## 2015-08-13 DIAGNOSIS — M79676 Pain in unspecified toe(s): Secondary | ICD-10-CM

## 2015-08-13 NOTE — Patient Instructions (Signed)
AI noticed multiple areas of scabs on the toes in your right and left feet from shoe rub. Change your shoes to your soft running style shoes immediately.  Diabetes and Foot Care Diabetes may cause you to have problems because of poor blood supply (circulation) to your feet and legs. This may cause the skin on your feet to become thinner, break easier, and heal more slowly. Your skin may become dry, and the skin may peel and crack. You may also have nerve damage in your legs and feet causing decreased feeling in them. You may not notice minor injuries to your feet that could lead to infections or more serious problems. Taking care of your feet is one of the most important things you can do for yourself.  HOME CARE INSTRUCTIONS  Wear shoes at all times, even in the house. Do not go barefoot. Bare feet are easily injured.  Check your feet daily for blisters, cuts, and redness. If you cannot see the bottom of your feet, use a mirror or ask someone for help.  Wash your feet with warm water (do not use hot water) and mild soap. Then pat your feet and the areas between your toes until they are completely dry. Do not soak your feet as this can dry your skin.  Apply a moisturizing lotion or petroleum jelly (that does not contain alcohol and is unscented) to the skin on your feet and to dry, brittle toenails. Do not apply lotion between your toes.  Trim your toenails straight across. Do not dig under them or around the cuticle. File the edges of your nails with an emery board or nail file.  Do not cut corns or calluses or try to remove them with medicine.  Wear clean socks or stockings every day. Make sure they are not too tight. Do not wear knee-high stockings since they may decrease blood flow to your legs.  Wear shoes that fit properly and have enough cushioning. To break in new shoes, wear them for just a few hours a day. This prevents you from injuring your feet. Always look in your shoes before you  put them on to be sure there are no objects inside.  Do not cross your legs. This may decrease the blood flow to your feet.  If you find a minor scrape, cut, or break in the skin on your feet, keep it and the skin around it clean and dry. These areas may be cleansed with mild soap and water. Do not cleanse the area with peroxide, alcohol, or iodine.  When you remove an adhesive bandage, be sure not to damage the skin around it.  If you have a wound, look at it several times a day to make sure it is healing.  Do not use heating pads or hot water bottles. They may burn your skin. If you have lost feeling in your feet or legs, you may not know it is happening until it is too late.  Make sure your health care provider performs a complete foot exam at least annually or more often if you have foot problems. Report any cuts, sores, or bruises to your health care provider immediately. SEEK MEDICAL CARE IF:   You have an injury that is not healing.  You have cuts or breaks in the skin.  You have an ingrown nail.  You notice redness on your legs or feet.  You feel burning or tingling in your legs or feet.  You have pain or cramps  in your legs and feet.  Your legs or feet are numb.  Your feet always feel cold. SEEK IMMEDIATE MEDICAL CARE IF:   There is increasing redness, swelling, or pain in or around a wound.  There is a red line that goes up your leg.  Pus is coming from a wound.  You develop a fever or as directed by your health care provider.  You notice a bad smell coming from an ulcer or wound.   This information is not intended to replace advice given to you by your health care provider. Make sure you discuss any questions you have with your health care provider.   Document Released: 06/25/2000 Document Revised: 02/28/2013 Document Reviewed: 12/05/2012 Elsevier Interactive Patient Education [employer].

## 2015-08-13 NOTE — Progress Notes (Signed)
Patient ID: [name], male   DOB: [birth year], 65 y.o.   MRN: [id number]  Subjective: This patient presents today with the last visit for this problem on 10/14/2014. He is complaining of thickened elongated toenails and for cough or walking wearing shoes and requests toenail debridement  Objective: Patient is wearing loafer deck style shoes that are run over and distorted Eschar friction rub on first left, second left, third left, second right toes. The there is no surrounding erythema edema surrounding these eschars. The toenails are extremely elongated, brittle, deformed, hypertrophic and tender to direct palpation 6-10  Assessment: Friction irritation toes without clinical sign of infection On satisfactory fit of patient's shoes Symptomatic onychomycoses 6-10  Plan: Patient advised to not wear existing shoes and were replacement shoes that do not rub against toes causing irritation The toenails 6-10 are debrided mechanically and electronically without any bleeding  Reappoint 3 months

## 2015-09-03 ENCOUNTER — Ambulatory Visit: Payer: Medicare Other | Admitting: Physician Assistant

## 2015-09-04 ENCOUNTER — Other Ambulatory Visit: Payer: Self-pay

## 2015-09-04 MED ORDER — DILTIAZEM HCL ER COATED BEADS 120 MG PO CP24: 120.0000 mg | ORAL_CAPSULE | Freq: Every day | ORAL | Status: DC

## 2015-09-04 NOTE — Telephone Encounter (Signed)
Rx(s) sent to pharmacy electronically.  

## 2015-09-08 ENCOUNTER — Encounter (HOSPITAL_COMMUNITY): Payer: Self-pay | Admitting: *Deleted

## 2015-09-08 ENCOUNTER — Emergency Department (HOSPITAL_COMMUNITY): Payer: Medicare Other

## 2015-09-08 ENCOUNTER — Inpatient Hospital Stay (HOSPITAL_COMMUNITY)
Admission: EM | Admit: 2015-09-08 | Discharge: 2015-09-17 | DRG: 872 | Disposition: A | Discharge status: Home / Self Care | Payer: Medicare Other | Attending: Internal Medicine | Admitting: Internal Medicine

## 2015-09-08 DIAGNOSIS — E785 Hyperlipidemia, unspecified: Secondary | ICD-10-CM | POA: Diagnosis present

## 2015-09-08 DIAGNOSIS — N433 Hydrocele, unspecified: Secondary | ICD-10-CM | POA: Diagnosis present

## 2015-09-08 DIAGNOSIS — Z87891 Personal history of nicotine dependence: Secondary | ICD-10-CM | POA: Diagnosis not present

## 2015-09-08 DIAGNOSIS — K819 Cholecystitis, unspecified: Secondary | ICD-10-CM | POA: Diagnosis present

## 2015-09-08 DIAGNOSIS — Z6841 Body Mass Index (BMI) 40.0 and over, adult: Secondary | ICD-10-CM | POA: Diagnosis not present

## 2015-09-08 DIAGNOSIS — N442 Benign cyst of testis: Secondary | ICD-10-CM | POA: Diagnosis present

## 2015-09-08 DIAGNOSIS — I130 Hypertensive heart and chronic kidney disease with heart failure and stage 1 through stage 4 chronic kidney disease, or unspecified chronic kidney disease: Secondary | ICD-10-CM | POA: Diagnosis present

## 2015-09-08 DIAGNOSIS — I10 Essential (primary) hypertension: Secondary | ICD-10-CM | POA: Diagnosis present

## 2015-09-08 DIAGNOSIS — Z955 Presence of coronary angioplasty implant and graft: Secondary | ICD-10-CM

## 2015-09-08 DIAGNOSIS — E1122 Type 2 diabetes mellitus with diabetic chronic kidney disease: Secondary | ICD-10-CM | POA: Diagnosis present

## 2015-09-08 DIAGNOSIS — R739 Hyperglycemia, unspecified: Secondary | ICD-10-CM | POA: Diagnosis present

## 2015-09-08 DIAGNOSIS — G4733 Obstructive sleep apnea (adult) (pediatric): Secondary | ICD-10-CM | POA: Diagnosis present

## 2015-09-08 DIAGNOSIS — N179 Acute kidney failure, unspecified: Secondary | ICD-10-CM

## 2015-09-08 DIAGNOSIS — I5032 Chronic diastolic (congestive) heart failure: Secondary | ICD-10-CM | POA: Diagnosis present

## 2015-09-08 DIAGNOSIS — N492 Inflammatory disorders of scrotum: Secondary | ICD-10-CM | POA: Diagnosis present

## 2015-09-08 DIAGNOSIS — E1165 Type 2 diabetes mellitus with hyperglycemia: Secondary | ICD-10-CM | POA: Diagnosis present

## 2015-09-08 DIAGNOSIS — N499 Inflammatory disorder of unspecified male genital organ: Secondary | ICD-10-CM

## 2015-09-08 DIAGNOSIS — N183 Chronic kidney disease, stage 3 unspecified: Secondary | ICD-10-CM | POA: Diagnosis present

## 2015-09-08 DIAGNOSIS — L0390 Cellulitis, unspecified: Secondary | ICD-10-CM

## 2015-09-08 DIAGNOSIS — L089 Local infection of the skin and subcutaneous tissue, unspecified: Secondary | ICD-10-CM | POA: Diagnosis present

## 2015-09-08 DIAGNOSIS — Z79899 Other long term (current) drug therapy: Secondary | ICD-10-CM

## 2015-09-08 DIAGNOSIS — I129 Hypertensive chronic kidney disease with stage 1 through stage 4 chronic kidney disease, or unspecified chronic kidney disease: Secondary | ICD-10-CM | POA: Diagnosis present

## 2015-09-08 DIAGNOSIS — I2510 Atherosclerotic heart disease of native coronary artery without angina pectoris: Secondary | ICD-10-CM | POA: Diagnosis present

## 2015-09-08 DIAGNOSIS — I4891 Unspecified atrial fibrillation: Secondary | ICD-10-CM | POA: Diagnosis not present

## 2015-09-08 DIAGNOSIS — R1030 Lower abdominal pain, unspecified: Secondary | ICD-10-CM

## 2015-09-08 DIAGNOSIS — R1011 Right upper quadrant pain: Secondary | ICD-10-CM

## 2015-09-08 DIAGNOSIS — A419 Sepsis, unspecified organism: Principal | ICD-10-CM

## 2015-09-08 DIAGNOSIS — Z9981 Dependence on supplemental oxygen: Secondary | ICD-10-CM

## 2015-09-08 DIAGNOSIS — K760 Fatty (change of) liver, not elsewhere classified: Secondary | ICD-10-CM | POA: Diagnosis present

## 2015-09-08 DIAGNOSIS — N5089 Other specified disorders of the male genital organs: Secondary | ICD-10-CM

## 2015-09-08 DIAGNOSIS — Z7901 Long term (current) use of anticoagulants: Secondary | ICD-10-CM

## 2015-09-08 DIAGNOSIS — Z794 Long term (current) use of insulin: Secondary | ICD-10-CM | POA: Diagnosis not present

## 2015-09-08 DIAGNOSIS — N5082 Scrotal pain: Secondary | ICD-10-CM

## 2015-09-08 DIAGNOSIS — E118 Type 2 diabetes mellitus with unspecified complications: Secondary | ICD-10-CM | POA: Diagnosis not present

## 2015-09-08 DIAGNOSIS — I480 Paroxysmal atrial fibrillation: Secondary | ICD-10-CM | POA: Diagnosis present

## 2015-09-08 DIAGNOSIS — R0902 Hypoxemia: Secondary | ICD-10-CM | POA: Diagnosis present

## 2015-09-08 DIAGNOSIS — E1140 Type 2 diabetes mellitus with diabetic neuropathy, unspecified: Secondary | ICD-10-CM | POA: Diagnosis present

## 2015-09-08 DIAGNOSIS — I481 Persistent atrial fibrillation: Secondary | ICD-10-CM | POA: Diagnosis not present

## 2015-09-08 LAB — BASIC METABOLIC PANEL
ANION GAP: 16 — AB (ref 5–15)
BUN: 24 mg/dL — ABNORMAL HIGH (ref 6–20)
CALCIUM: 9.1 mg/dL (ref 8.9–10.3)
CO2: 23 mmol/L (ref 22–32)
Chloride: 93 mmol/L — ABNORMAL LOW (ref 101–111)
Creatinine, Ser: 1.79 mg/dL — ABNORMAL HIGH (ref 0.61–1.24)
GFR calc non Af Amer: 39 mL/min — ABNORMAL LOW (ref >60)
GFR, EST AFRICAN AMERICAN: 45 mL/min — AB (ref >60)
Glucose, Bld: 397 mg/dL — ABNORMAL HIGH (ref 65–99)
Potassium: 4.5 mmol/L (ref 3.5–5.1)
Sodium: 132 mmol/L — ABNORMAL LOW (ref 135–145)

## 2015-09-08 LAB — CBC
HCT: 36.7 % — ABNORMAL LOW (ref 39.0–52.0)
HEMOGLOBIN: 12 g/dL — AB (ref 13.0–17.0)
MCH: 25.6 pg — AB (ref 26.0–34.0)
MCHC: 32.7 g/dL (ref 30.0–36.0)
MCV: 78.4 fL (ref 78.0–100.0)
Platelets: 317 10*3/uL (ref 150–400)
RBC: 4.68 MIL/uL (ref 4.22–5.81)
RDW: 14.7 % (ref 11.5–15.5)
WBC: 21.9 10*3/uL — ABNORMAL HIGH (ref 4.0–10.5)

## 2015-09-08 LAB — URINALYSIS, ROUTINE W REFLEX MICROSCOPIC
Bilirubin Urine: NEGATIVE
Glucose, UA: 100 mg/dL — AB
Hgb urine dipstick: NEGATIVE
KETONES UR: NEGATIVE mg/dL
LEUKOCYTES UA: NEGATIVE
NITRITE: NEGATIVE
PH: 6 (ref 5.0–8.0)
Protein, ur: NEGATIVE mg/dL
SPECIFIC GRAVITY, URINE: 1.01 (ref 1.005–1.030)

## 2015-09-08 LAB — I-STAT CG4 LACTIC ACID, ED
LACTIC ACID, VENOUS: 2.33 mmol/L — AB (ref 0.5–2.0)
LACTIC ACID, VENOUS: 2 mmol/L (ref 0.5–2.0)

## 2015-09-08 LAB — GLUCOSE, CAPILLARY: GLUCOSE-CAPILLARY: 375 mg/dL — AB (ref 65–99)

## 2015-09-08 MED ORDER — VANCOMYCIN HCL 10 G IV SOLR
1500.0000 mg | Freq: Once | INTRAVENOUS | Status: AC
  Administered 2015-09-08: 1500 mg via INTRAVENOUS
  Filled 2015-09-08: qty 1500

## 2015-09-08 MED ORDER — IOHEXOL 300 MG/ML  SOLN
75.0000 mL | Freq: Once | INTRAMUSCULAR | Status: AC | PRN
  Administered 2015-09-08: 75 mL via INTRAVENOUS

## 2015-09-08 MED ORDER — POTASSIUM CHLORIDE CRYS ER 20 MEQ PO TBCR
20.0000 meq | EXTENDED_RELEASE_TABLET | Freq: Every day | ORAL | Status: DC
  Administered 2015-09-08 – 2015-09-14 (×7): 20 meq via ORAL
  Filled 2015-09-08 (×7): qty 1

## 2015-09-08 MED ORDER — INSULIN ASPART 100 UNIT/ML ~~LOC~~ SOLN
0.0000 [IU] | Freq: Every day | SUBCUTANEOUS | Status: DC
  Administered 2015-09-08: 5 [IU] via SUBCUTANEOUS

## 2015-09-08 MED ORDER — RIVAROXABAN 20 MG PO TABS
20.0000 mg | ORAL_TABLET | Freq: Every day | ORAL | Status: DC
  Administered 2015-09-09 – 2015-09-17 (×9): 20 mg via ORAL
  Filled 2015-09-08 (×10): qty 1

## 2015-09-08 MED ORDER — ONDANSETRON HCL 4 MG PO TABS: 4.0000 mg | ORAL_TABLET | Freq: Four times a day (QID) | ORAL | Status: DC | PRN

## 2015-09-08 MED ORDER — INSULIN ASPART 100 UNIT/ML ~~LOC~~ SOLN
0.0000 [IU] | Freq: Three times a day (TID) | SUBCUTANEOUS | Status: DC
  Administered 2015-09-09: 20 [IU] via SUBCUTANEOUS

## 2015-09-08 MED ORDER — SODIUM CHLORIDE 0.9 % IV BOLUS (SEPSIS)
1000.0000 mL | Freq: Once | INTRAVENOUS | Status: AC
  Administered 2015-09-08: 1000 mL via INTRAVENOUS

## 2015-09-08 MED ORDER — INSULIN DETEMIR 100 UNIT/ML FLEXPEN: 20.0000 [IU] | PEN_INJECTOR | Freq: Every day | SUBCUTANEOUS | Status: DC

## 2015-09-08 MED ORDER — ADULT MULTIVITAMIN W/MINERALS CH
1.0000 | ORAL_TABLET | Freq: Every day | ORAL | Status: DC
  Administered 2015-09-08 – 2015-09-17 (×10): 1 via ORAL
  Filled 2015-09-08 (×10): qty 1

## 2015-09-08 MED ORDER — ACETAMINOPHEN 325 MG PO TABS: 650.0000 mg | ORAL_TABLET | Freq: Four times a day (QID) | ORAL | Status: DC | PRN

## 2015-09-08 MED ORDER — VANCOMYCIN HCL IN DEXTROSE 1-5 GM/200ML-% IV SOLN
1000.0000 mg | Freq: Once | INTRAVENOUS | Status: AC
  Administered 2015-09-08: 1000 mg via INTRAVENOUS
  Filled 2015-09-08: qty 200

## 2015-09-08 MED ORDER — ROSUVASTATIN CALCIUM 20 MG PO TABS
40.0000 mg | ORAL_TABLET | Freq: Every day | ORAL | Status: DC
  Administered 2015-09-08 – 2015-09-17 (×10): 40 mg via ORAL
  Filled 2015-09-08 (×10): qty 2

## 2015-09-08 MED ORDER — OXYCODONE HCL 5 MG PO TABS
15.0000 mg | ORAL_TABLET | ORAL | Status: DC | PRN
  Administered 2015-09-09: 15 mg via ORAL
  Filled 2015-09-08: qty 3

## 2015-09-08 MED ORDER — ONDANSETRON HCL 4 MG/2ML IJ SOLN: 4.0000 mg | Freq: Four times a day (QID) | INTRAMUSCULAR | Status: DC | PRN

## 2015-09-08 MED ORDER — LISINOPRIL 10 MG PO TABS
10.0000 mg | ORAL_TABLET | Freq: Every day | ORAL | Status: DC
  Administered 2015-09-10: 10 mg via ORAL
  Filled 2015-09-08 (×4): qty 1

## 2015-09-08 MED ORDER — OXYCODONE HCL 5 MG PO TABS
5.0000 mg | ORAL_TABLET | ORAL | Status: DC | PRN
  Administered 2015-09-08: 5 mg via ORAL
  Filled 2015-09-08: qty 1

## 2015-09-08 MED ORDER — PIPERACILLIN-TAZOBACTAM 3.375 G IVPB
3.3750 g | Freq: Three times a day (TID) | INTRAVENOUS | Status: DC
  Administered 2015-09-09 – 2015-09-17 (×25): 3.375 g via INTRAVENOUS
  Filled 2015-09-08 (×31): qty 50

## 2015-09-08 MED ORDER — SODIUM CHLORIDE 0.9 % IV SOLN
INTRAVENOUS | Status: DC
  Administered 2015-09-08 – 2015-09-13 (×5): via INTRAVENOUS

## 2015-09-08 MED ORDER — HYDROMORPHONE HCL 1 MG/ML IJ SOLN
0.5000 mg | INTRAMUSCULAR | Status: DC | PRN
  Administered 2015-09-08 – 2015-09-09 (×3): 1 mg via INTRAVENOUS
  Filled 2015-09-08 (×3): qty 1

## 2015-09-08 MED ORDER — ONDANSETRON HCL 4 MG/2ML IJ SOLN: 4.0000 mg | Freq: Three times a day (TID) | INTRAMUSCULAR | Status: AC | PRN

## 2015-09-08 MED ORDER — AMIODARONE HCL 200 MG PO TABS
200.0000 mg | ORAL_TABLET | Freq: Every day | ORAL | Status: DC
  Administered 2015-09-08 – 2015-09-17 (×10): 200 mg via ORAL
  Filled 2015-09-08 (×10): qty 1

## 2015-09-08 MED ORDER — FENTANYL CITRATE (PF) 100 MCG/2ML IJ SOLN
100.0000 ug | Freq: Once | INTRAMUSCULAR | Status: AC
  Administered 2015-09-08: 100 ug via INTRAVENOUS
  Filled 2015-09-08: qty 2

## 2015-09-08 MED ORDER — DILTIAZEM HCL ER COATED BEADS 120 MG PO CP24
120.0000 mg | ORAL_CAPSULE | Freq: Every day | ORAL | Status: DC
  Administered 2015-09-08 – 2015-09-17 (×10): 120 mg via ORAL
  Filled 2015-09-08 (×17): qty 1

## 2015-09-08 MED ORDER — METOPROLOL TARTRATE 25 MG PO TABS
25.0000 mg | ORAL_TABLET | Freq: Two times a day (BID) | ORAL | Status: DC
  Administered 2015-09-09 – 2015-09-17 (×17): 25 mg via ORAL
  Filled 2015-09-08 (×18): qty 1

## 2015-09-08 MED ORDER — OXYCODONE HCL 5 MG PO TABS
10.0000 mg | ORAL_TABLET | Freq: Once | ORAL | Status: AC
  Administered 2015-09-09: 10 mg via ORAL
  Filled 2015-09-08: qty 2

## 2015-09-08 MED ORDER — VANCOMYCIN HCL 10 G IV SOLR
1750.0000 mg | INTRAVENOUS | Status: DC
  Administered 2015-09-09 – 2015-09-14 (×5): 1750 mg via INTRAVENOUS
  Filled 2015-09-08 (×6): qty 1750

## 2015-09-08 MED ORDER — ACETAMINOPHEN 650 MG RE SUPP: 650.0000 mg | Freq: Four times a day (QID) | RECTAL | Status: DC | PRN

## 2015-09-08 MED ORDER — ALUM & MAG HYDROXIDE-SIMETH 200-200-20 MG/5ML PO SUSP: 30.0000 mL | Freq: Four times a day (QID) | ORAL | Status: DC | PRN

## 2015-09-08 MED ORDER — INSULIN ASPART 100 UNIT/ML ~~LOC~~ SOLN
10.0000 [IU] | Freq: Once | SUBCUTANEOUS | Status: AC
  Administered 2015-09-08: 10 [IU] via SUBCUTANEOUS
  Filled 2015-09-08: qty 1

## 2015-09-08 MED ORDER — POLYVINYL ALCOHOL 1.4 % OP SOLN
1.0000 [drp] | OPHTHALMIC | Status: DC | PRN
  Administered 2015-09-09 – 2015-09-15 (×4): 1 [drp] via OPHTHALMIC
  Filled 2015-09-08 (×2): qty 15

## 2015-09-08 MED ORDER — HYDROMORPHONE HCL 1 MG/ML IJ SOLN
1.0000 mg | Freq: Once | INTRAMUSCULAR | Status: AC
  Administered 2015-09-08: 1 mg via INTRAVENOUS
  Filled 2015-09-08: qty 1

## 2015-09-08 MED ORDER — PIPERACILLIN-TAZOBACTAM 3.375 G IVPB 30 MIN
3.3750 g | Freq: Once | INTRAVENOUS | Status: AC
  Administered 2015-09-08: 3.375 g via INTRAVENOUS
  Filled 2015-09-08: qty 50

## 2015-09-08 MED ORDER — FUROSEMIDE 80 MG PO TABS
80.0000 mg | ORAL_TABLET | Freq: Two times a day (BID) | ORAL | Status: DC
  Administered 2015-09-09 – 2015-09-10 (×4): 80 mg via ORAL
  Filled 2015-09-08 (×4): qty 1

## 2015-09-08 MED ORDER — INSULIN DETEMIR 100 UNIT/ML ~~LOC~~ SOLN
20.0000 [IU] | Freq: Every day | SUBCUTANEOUS | Status: DC
  Administered 2015-09-09 – 2015-09-10 (×2): 20 [IU] via SUBCUTANEOUS
  Filled 2015-09-08 (×3): qty 0.2

## 2015-09-08 MED ORDER — SODIUM CHLORIDE 0.9 % IV SOLN: INTRAVENOUS | Status: AC

## 2015-09-08 MED ORDER — GABAPENTIN 400 MG PO CAPS
1200.0000 mg | ORAL_CAPSULE | Freq: Two times a day (BID) | ORAL | Status: DC
  Administered 2015-09-08 – 2015-09-17 (×18): 1200 mg via ORAL
  Filled 2015-09-08 (×18): qty 3

## 2015-09-08 MED ORDER — SPIRONOLACTONE 25 MG PO TABS
25.0000 mg | ORAL_TABLET | Freq: Every day | ORAL | Status: DC
  Administered 2015-09-08 – 2015-09-10 (×3): 25 mg via ORAL
  Filled 2015-09-08 (×3): qty 1

## 2015-09-08 NOTE — H&P (Addendum)
[name] Admission History and Physical       [name] MCN:[id number] DOB: 1950-08-30 DOA: 09/08/2015  Referring physician: EDP PCP: [name], MD  Specialists:   Chief Complaint: Scrotal Swelling and Pain  HPI: [name] is a 65 y.o. male with Multiple Medical Problems including Paroxysmal Atrial Fibrillation on Xarelto Rx, DM2, HTN, Chronic Diastolic CHF who presents to the ED with 4 days of worsening pain and swelling of his Scrotum.   He denies fevers or chills.   He was seen in the ED, and a Sepsis workup was initiated a CT scan was performed which revealed diffuse thickening of the left scrotal wall and perineum concerning for Inflammation or an underlying neoplastic process.   Urology was consulted and saw patient in the ED.  The area spontaneously began to drain purulent fluid.   He was placed on  IV Vancomycin and Zosyn and referred for admission.      Review of Systems:  Constitutional: No Weight Loss, No Weight Gain, Night Sweats, Fevers, Chills, Dizziness, Light Headedness, Fatigue, or Generalized Weakness HEENT: No Headaches, Difficulty Swallowing,Tooth/Dental Problems,Sore Throat,  No Sneezing, Rhinitis, Ear Ache, Nasal Congestion, or Post Nasal Drip,  Cardio-vascular:  No Chest pain, Orthopnea, PND, Edema in Lower Extremities, Anasarca, Dizziness, Palpitations  Resp: No Dyspnea, No DOE, No Productive Cough, No Non-Productive Cough, No Hemoptysis, No Wheezing.    GI: No Heartburn, Indigestion, Abdominal Pain, Nausea, Vomiting, Diarrhea, Constipation, Hematemesis, Hematochezia, Melena, Change in Bowel Habits,  Loss of Appetite  GU: +Left Scrotal Redness and Swelling, No Dysuria, No Change in Color of Urine, No Urgency or Urinary Frequency, No Flank pain.  Musculoskeletal: No Joint Pain or Swelling, No Decreased Range of Motion, No Back Pain.  Neurologic: No Syncope, No Seizures, Muscle Weakness, Paresthesia, Vision Disturbance or Loss, No Diplopia,  No Vertigo, No Difficulty Walking,  Skin: No Rash or Lesions. Psych: No Change in Mood or Affect, No Depression or Anxiety, No Memory loss, No Confusion, or Hallucinations   Past Medical History  Diagnosis Date  . Hypertension   . Chronic diastolic CHF (congestive heart failure) (HCC)   . CAD (coronary artery disease)     a. s/p PCI to RCA in 2012. b. prior cath in 01/2014 with elevated L/RH pressures, mild-mod CAD of LAD/LCx with patent RCA, normal EF, c/b CIN/CHF.  [name] PAF (paroxysmal atrial fibrillation) (HCC)     TEE DCCV 09/23/2014  . Anemia   . Pilonidal cyst 1980's; 01/25/2013  . Neuropathy (HCC)   . History of blood transfusion ~ 1954    "related to OR"  . Carpal tunnel syndrome, bilateral   . Morbid obesity (HCC)   . Hyperlipidemia   . OSA (obstructive sleep apnea)     "I wear nasal prongs; haven't been using prongs recently" (09/19/2014)  . Type II diabetes mellitus (HCC)   . Arthritis     "hands and lower back" (09/19/2014)  . Chronic lower back pain   . Chronic kidney disease (CKD), stage III (moderate)   . Cellulitis   . Hypoxia     a. Qualified for home O2 at [facility] in 09/2014.  [name] Physical deconditioning      Past Surgical History  Procedure Laterality Date  . Abdominal surgery  ~ 1954    BENIGN TUMOR REMOVED  . Debridement  foot Left     debriding diabetic foot ulcers  . Cataract extraction w/phaco Right 11/15/2012    Procedure: CATARACT EXTRACTION PHACO AND INTRAOCULAR LENS PLACEMENT (  IOC);  Surgeon: Shade Flood, MD;  Location[facility] OR;  Service: Ophthalmology;  Laterality: Right;  . Cataract extraction w/phaco Left 11/29/2012    Procedure: CATARACT EXTRACTION PHACO AND INTRAOCULAR LENS PLACEMENT (IOC);  Surgeon: Shade Flood, MD;  Location: [facility] OR;  Service: Ophthalmology;  Laterality: Left;  . Pilonidal cyst excision N/A 01/08/2013    Procedure: CYST EXCISION PILONIDAL EXTENSIVE;  Surgeon: Axel Filler, MD;  Location: [facility] OR;  Service: General;  Laterality: N/A;  .  Foreign body removal Right 2014    heel,  splinter removal   . Pars plana vitrectomy Left 06/05/2013    Procedure: PARS PLANA VITRECTOMY WITH 23 GAUGE with Endolaser(constellation);  Surgeon: Shade Flood, MD;  Location: [facility] OR;  Service: Ophthalmology;  Laterality: Left;  . Left and right heart catheterization with coronary angiogram N/A 01/31/2014    Procedure: LEFT AND RIGHT HEART CATHETERIZATION WITH CORONARY ANGIOGRAM;  Surgeon: Micheline Chapman, MD;  Location: [facility] CATH LAB;  Service: Cardiovascular;  Laterality: N/A;  . Tonsillectomy    . Appendectomy    . Eye surgery    . Coronary angioplasty with stent placement  August 2012    RCA DES - [facility]  . Cardioversion  2010    [name] 09/19/2014  . Pilonidal cyst excision  1980's    "in [place]"  . Tee without cardioversion N/A 09/23/2014    Procedure: TRANSESOPHAGEAL ECHOCARDIOGRAM (TEE);  Surgeon: Lewayne Bunting, MD;  Location: [facility] ENDOSCOPY;  Service: Cardiovascular;  Laterality: N/A;  . Cardioversion N/A 09/23/2014    Procedure: CARDIOVERSION;  Surgeon: Lewayne Bunting, MD;  Location: [facility] ENDOSCOPY;  Service: Cardiovascular;  Laterality: N/A;  . Cardioversion N/A 12/11/2014    Procedure: CARDIOVERSION;  Surgeon: Thurmon Fair, MD;  Location: [facility] ENDOSCOPY;  Service: Cardiovascular;  Laterality: N/A;      Prior to Admission medications   Medication Sig Start Date End Date Taking? Authorizing Provider  amiodarone (PACERONE) 200 MG tablet Take 200 mg by mouth daily. 08/29/15  Yes Historical Provider, MD  diltiazem (CARDIZEM CD) 120 MG 24 hr capsule Take 1 capsule (120 mg total) by mouth daily. 09/04/15  Yes Lewayne Bunting, MD  furosemide (LASIX) 80 MG tablet Take 1 tablet (80 mg total) by mouth 2 (two) times daily. 04/09/15  Yes Lewayne Bunting, MD  gabapentin (NEURONTIN) 300 MG capsule Take 1,200 mg by mouth 2 (two) times daily.    Yes Historical Provider, MD  Hypromellose (NATURAL BALANCE TEARS OP) Place 1  drop into both eyes daily as needed (for dry eyes).    Yes Historical Provider, MD  Insulin Detemir (LEVEMIR FLEXTOUCH) 100 UNIT/ML Pen Inject 20 Units into the skin daily. 07/10/15  Yes [name], MD  insulin regular human CONCENTRATED (HUMULIN R) 500 UNIT/ML SOLN injection Inject 0.2 mLs (100 Units total) into the skin 2 (two) times daily with a meal. Patient taking differently: Inject 100 Units into the skin 3 (three) times daily before meals.  12/11/14  Yes Rodolph Bong, MD  lisinopril (PRINIVIL,ZESTRIL) 10 MG tablet Take 1 tablet (10 mg total) by mouth daily. 04/09/15  Yes Lewayne Bunting, MD  metoprolol tartrate (LOPRESSOR) 25 MG tablet Take 1 tablet (25 mg total) by mouth 2 (two) times daily. 04/09/15  Yes Lewayne Bunting, MD  Multiple Vitamin (MULTIVITAMIN WITH MINERALS) TABS Take 1 tablet by mouth daily.   Yes Historical Provider, MD  potassium chloride SA (KLOR-CON M20) 20 MEQ tablet Take 1 tablet (20 mEq total)  by mouth 2 (two) times daily. Patient taking differently: Take 20 mEq by mouth daily.  11/27/14  Yes Dione Booze, MD  rivaroxaban (XARELTO) 20 MG TABS tablet Take 1 tablet (20 mg total) by mouth daily with supper. 07/17/15  Yes Lewayne Bunting, MD  rosuvastatin (CRESTOR) 40 MG tablet Take 40 mg by mouth daily.   Yes Historical Provider, MD  spironolactone (ALDACTONE) 25 MG tablet Take 1 tablet (25 mg total) by mouth daily. 04/09/15  Yes Lewayne Bunting, MD  Insulin Pen Needle 31G X 8 MM MISC Use to inject insulin daily as instructed 07/10/15   [name], MD  methocarbamol (ROBAXIN) 500 MG tablet Take 1 tablet (500 mg total) by mouth every 8 (eight) hours as needed for muscle spasms. 07/10/15   [name], MD  oxycodone (ROXICODONE) 30 MG immediate release tablet Take 1 tablet (30 mg total) by mouth every 6 (six) hours as needed (seevre pain). 07/12/15   Nicole Pisciotta, PA-C     No Known Allergies     Social History:  reports that he quit smoking about 32 years  ago. His smoking use included Cigarettes. He has a 20 pack-year smoking history. He has never used smokeless tobacco. He reports that he does not drink alcohol or use illicit drugs.    Family History  Problem Relation Age of Onset  . Adopted: Yes  . Other Other     PT ADOPTED       Physical Exam:  GEN:  Pleasant Morbidly Obese  65 y.o. Caucasian male examined and in no acute distress; cooperative with exam Filed Vitals:   09/08/15 1715 09/08/15 1745 09/08/15 1800 09/08/15 1915  BP: 116/68 123/77 110/58 108/97  Pulse: 141  126 137  Temp:      TempSrc:      Resp:      Height:      Weight:      SpO2: 93%  96% 95%   Blood pressure 108/97, pulse 137, temperature 98.3 F (36.8 C), temperature source Oral, resp. rate 20, height 6' (1.829 m), weight 169.645 kg (374 lb), SpO2 95 %. PSYCH: He is alert and oriented x4; does not appear anxious does not appear depressed; affect is normal HEENT: Normocephalic and Atraumatic, Mucous membranes pink; PERRLA; EOM intact; Fundi:  Benign;  No scleral icterus, Nares: Patent, Oropharynx: Clear, Fair Dentition,    Neck:  FROM, No Cervical Lymphadenopathy nor Thyromegaly or Carotid Bruit; No JVD; Breasts:: Not examined CHEST WALL: No tenderness CHEST: Normal respiration, clear to auscultation bilaterally HEART: Regular rate and rhythm; no murmurs rubs or gallops BACK: No kyphosis or scoliosis; No CVA tenderness ABDOMEN: Positive Bowel Sounds, Obese, Soft Non-Tender, No Rebound or Guarding; No Masses, No Organomegaly. Rectal Exam: Not done EXTREMITIES: No Cyanosis, Clubbing, or Edema; No Ulcerations. Genitalia: not examined PULSES: 2+ and symmetric SKIN: Normal hydration no rash or ulceration CNS:  Alert and Oriented x 4, No Focal Deficits Vascular: pulses palpable throughout    Labs on Admission:  Basic Metabolic Panel:  Recent Labs Lab 09/08/15 1709  NA 132*  K 4.5  CL 93*  CO2 23  GLUCOSE 397*  BUN 24*  CREATININE 1.79*  CALCIUM  9.1   Liver Function Tests: No results for input(s): AST, ALT, ALKPHOS, BILITOT, PROT, ALBUMIN in the last 168 hours. No results for input(s): LIPASE, AMYLASE in the last 168 hours. No results for input(s): AMMONIA in the last 168 hours. CBC:  Recent Labs Lab 09/08/15 1709  WBC  21.9*  HGB 12.0*  HCT 36.7*  MCV 78.4  PLT 317   Cardiac Enzymes: No results for input(s): CKTOTAL, CKMB, CKMBINDEX, TROPONINI in the last 168 hours.  BNP (last 3 results)  Recent Labs  12/04/14 1756 07/08/15 1417 07/09/15 0650  BNP 163.7* 78.4 132.5*    ProBNP (last 3 results) No results for input(s): PROBNP in the last 8760 hours.  CBG: No results for input(s): GLUCAP in the last 168 hours.  Radiological Exams on Admission: Ct Pelvis W Contrast  09/08/2015  CLINICAL DATA:  65 year old male with scrotal swelling and pain. Patient has been unable to urinate last night. EXAM: CT PELVIS WITH CONTRAST TECHNIQUE: Multidetector CT imaging of the pelvis was performed using the standard protocol following the bolus administration of intravenous contrast. CONTRAST:  56mL OMNIPAQUE IOHEXOL 300 MG/ML  SOLN COMPARISON:  CT dated 07/12/2015 FINDINGS: Evaluation of this exam is limited in the absence of intravenous contrast. There is a small left hydrocele. A 3 mm focus of extratesticular calcification noted in left scrotum. Ultrasound may provide better evaluation of the scrotum and testicles. There is diffuse thickening of the posterior scrotal wall. An irregular masslike tissue is noted extending from the posterior scrotal wall to the posterior perineal subcutaneous fat. This mass has an exophytic component with irregular surface measuring approximately 5.5 x 11.3 cm. No discrete drainable fluid collection/abscess identified. Although the soft tissue thickening may be sequela of chronic infection/inflammation, the exophytic and irregular component of the tissue is concerning a neoplastic process. Clinical  correlation is recommended. The visualized ureters, the urinary bladder, prostate, and seminal vesicles appear grossly unremarkable. No bowel dilatation identified. There is aortoiliac atherosclerotic disease. No lymphadenopathy noted within the pelvis. Mild degenerative changes of lower lumbar spine.  No acute fracture. IMPRESSION: Diffuse thickening of the posterior scrotal wall and perineum with an exophytic and irregular masslike component concerning for underlying neoplasm. Clinical correlation is recommended. No drainable fluid collection identified. Electronically Signed   By: Elgie Collard M.D.   On: 09/08/2015 19:13     EKG: Independently reviewed.     Assessment/Plan:      65 y.o. male with  Active Problems:     1.    Sepsis (HCC)/Scrotal wall abscess     IV Vancomycin and Zosyn     Seen By Urology     2.    AKI       IVFs     Monitor BUN/Cr       3.    Atrial fibrillation- DCCV 2010     Continue Diltiazem, Metoprolol, and Amiodarone Rx     Continue Xarelto Rx       4.    Long term (current) use of anticoagulants     On Xarelto Rx       5.    Diabetes mellitus type 2, uncontrolled, with complications (HCC)     Continue LAntus Insulin      SSI coverage PRN     Check HbA1C in AM         6.    Essential hypertension, benign     Continue Lasix, Spironolactone, Lisinopril, Metoprolol as BP Tolerates     Monitor BPs       7.    Chronic diastolic congestive heart failure (HCC)     Monitor I/Os     Continue Lasix, Spironolactone, Lisinopril, Metoprolol as BP Tolerates       8.    CKD (chronic kidney disease) stage 3, GFR  30-59 ml/min     Monitor BUN/Cr     9.    OBESITY-MORBID BMI 54     Needs Weight Loss       10.  DVT Prophylaxis     On Xarelto Rx    Code Status:     FULL CODE        Family Communication:    No Family Present    Disposition Plan:    Inpatient Status with Expected LOS 2-3 days    Time spent:  70 Minutes      Enna Warwick  C [name] Pager [phone]   If 7AM -7PM Please Contact the Day Rounding Team MD for [name]  If 7PM-7AM, Please Contact Night-Floor Coverage  [email or web address] Password [facility] 09/08/2015, 8:49 PM     ADDENDUM:   Patient was seen and examined on 09/08/2015

## 2015-09-08 NOTE — Progress Notes (Signed)
Report received from ED EN, Windell Moulding.

## 2015-09-08 NOTE — ED Notes (Signed)
PT states the L side of his scrotum is hard, swollen and painful since last night and he's been unable to urinate since last night.

## 2015-09-08 NOTE — ED Provider Notes (Signed)
CSN: [id number]     Arrival date & time 09/08/15  1255 History   First MD Initiated Contact with Patient 09/08/15 1537     Chief Complaint  Patient presents with  . Testicle Pain  . Urinary Retention     (Consider location/radiation/quality/duration/timing/severity/associated sxs/prior Treatment) HPI Comments: The patient is a 65 year old male, he is a history of insulin-requiring diabetes, high blood pressure, severe obesity and chronic renal insufficiency or failure. The patient reports that approximately 4 days of swelling and tenderness around the base of the left side of his scrotum. This is gradually worsening, gradually becoming more painful and is now started to drain some blood products. He denies fevers. The patient has had inability to touch this area secondary to pain. He does have a history of a perirectal abscess in the past.  Patient is a 65 y.o. male presenting with testicular pain. The history is provided by the patient.  Testicle Pain    Past Medical History  Diagnosis Date  . Hypertension   . Chronic diastolic CHF (congestive heart failure) (HCC)   . CAD (coronary artery disease)     a. s/p PCI to RCA in 2012. b. prior cath in 01/2014 with elevated L/RH pressures, mild-mod CAD of LAD/LCx with patent RCA, normal EF, c/b CIN/CHF.  [name] PAF (paroxysmal atrial fibrillation) (HCC)     TEE DCCV 09/23/2014  . Anemia   . Pilonidal cyst 1980's; 01/25/2013  . Neuropathy (HCC)   . History of blood transfusion ~ 1954    "related to OR"  . Carpal tunnel syndrome, bilateral   . Morbid obesity (HCC)   . Hyperlipidemia   . OSA (obstructive sleep apnea)     "I wear nasal prongs; haven't been using prongs recently" (09/19/2014)  . Type II diabetes mellitus (HCC)   . Arthritis     "hands and lower back" (09/19/2014)  . Chronic lower back pain   . Chronic kidney disease (CKD), stage III (moderate)   . Cellulitis   . Hypoxia     a. Qualified for home O2 at [facility] in 09/2014.  [name] Physical  deconditioning    Past Surgical History  Procedure Laterality Date  . Abdominal surgery  ~ 1954    BENIGN TUMOR REMOVED  . Debridement  foot Left     debriding diabetic foot ulcers  . Cataract extraction w/phaco Right 11/15/2012    Procedure: CATARACT EXTRACTION PHACO AND INTRAOCULAR LENS PLACEMENT (IOC);  Surgeon: Shade Flood, MD;  Location: [facility] OR;  Service: Ophthalmology;  Laterality: Right;  . Cataract extraction w/phaco Left 11/29/2012    Procedure: CATARACT EXTRACTION PHACO AND INTRAOCULAR LENS PLACEMENT (IOC);  Surgeon: Shade Flood, MD;  Location: [facility] OR;  Service: Ophthalmology;  Laterality: Left;  . Pilonidal cyst excision N/A 01/08/2013    Procedure: CYST EXCISION PILONIDAL EXTENSIVE;  Surgeon: Axel Filler, MD;  Location: [facility] OR;  Service: General;  Laterality: N/A;  . Foreign body removal Right 2014    heel,  splinter removal   . Pars plana vitrectomy Left 06/05/2013    Procedure: PARS PLANA VITRECTOMY WITH 23 GAUGE with Endolaser(constellation);  Surgeon: Shade Flood, MD;  Location: [facility] OR;  Service: Ophthalmology;  Laterality: Left;  . Left and right heart catheterization with coronary angiogram N/A 01/31/2014    Procedure: LEFT AND RIGHT HEART CATHETERIZATION WITH CORONARY ANGIOGRAM;  Surgeon: Micheline Chapman, MD;  Location: [facility] CATH LAB;  Service: Cardiovascular;  Laterality: N/A;  . Tonsillectomy    . Appendectomy    .  Eye surgery    . Coronary angioplasty with stent placement  August 2012    RCA DES - [facility]  . Cardioversion  2010    [name] 09/19/2014  . Pilonidal cyst excision  1980's    "in [place]"  . Tee without cardioversion N/A 09/23/2014    Procedure: TRANSESOPHAGEAL ECHOCARDIOGRAM (TEE);  Surgeon: Lewayne Bunting, MD;  Location: [facility] ENDOSCOPY;  Service: Cardiovascular;  Laterality: N/A;  . Cardioversion N/A 09/23/2014    Procedure: CARDIOVERSION;  Surgeon: Lewayne Bunting, MD;  Location: [facility] ENDOSCOPY;  Service: Cardiovascular;   Laterality: N/A;  . Cardioversion N/A 12/11/2014    Procedure: CARDIOVERSION;  Surgeon: Thurmon Fair, MD;  Location: [facility] ENDOSCOPY;  Service: Cardiovascular;  Laterality: N/A;   Family History  Problem Relation Age of Onset  . Adopted: Yes  . Other Other     PT ADOPTED   Social History  Substance Use Topics  . Smoking status: Former Smoker -- 1.00 packs/day for 20 years    Types: Cigarettes    Quit date: 07/13/1983  . Smokeless tobacco: Never Used  . Alcohol Use: No    Review of Systems  Genitourinary: Positive for testicular pain.  All other systems reviewed and are negative.     Allergies  Review of patient's allergies indicates no known allergies.  Home Medications   Prior to Admission medications   Medication Sig Start Date End Date Taking? Authorizing Provider  amiodarone (PACERONE) 200 MG tablet Take 200 mg by mouth daily. 08/29/15  Yes Historical Provider, MD  diltiazem (CARDIZEM CD) 120 MG 24 hr capsule Take 1 capsule (120 mg total) by mouth daily. 09/04/15  Yes Lewayne Bunting, MD  furosemide (LASIX) 80 MG tablet Take 1 tablet (80 mg total) by mouth 2 (two) times daily. 04/09/15  Yes Lewayne Bunting, MD  gabapentin (NEURONTIN) 300 MG capsule Take 1,200 mg by mouth 2 (two) times daily.    Yes Historical Provider, MD  Hypromellose (NATURAL BALANCE TEARS OP) Place 1 drop into both eyes daily as needed (for dry eyes).    Yes Historical Provider, MD  Insulin Detemir (LEVEMIR FLEXTOUCH) 100 UNIT/ML Pen Inject 20 Units into the skin daily. 07/10/15  Yes [name], MD  insulin regular human CONCENTRATED (HUMULIN R) 500 UNIT/ML SOLN injection Inject 0.2 mLs (100 Units total) into the skin 2 (two) times daily with a meal. Patient taking differently: Inject 100 Units into the skin 3 (three) times daily before meals.  12/11/14  Yes Rodolph Bong, MD  lisinopril (PRINIVIL,ZESTRIL) 10 MG tablet Take 1 tablet (10 mg total) by mouth daily. 04/09/15  Yes Lewayne Bunting, MD   metoprolol tartrate (LOPRESSOR) 25 MG tablet Take 1 tablet (25 mg total) by mouth 2 (two) times daily. 04/09/15  Yes Lewayne Bunting, MD  Multiple Vitamin (MULTIVITAMIN WITH MINERALS) TABS Take 1 tablet by mouth daily.   Yes Historical Provider, MD  potassium chloride SA (KLOR-CON M20) 20 MEQ tablet Take 1 tablet (20 mEq total) by mouth 2 (two) times daily. Patient taking differently: Take 20 mEq by mouth daily.  11/27/14  Yes Dione Booze, MD  rivaroxaban (XARELTO) 20 MG TABS tablet Take 1 tablet (20 mg total) by mouth daily with supper. 07/17/15  Yes Lewayne Bunting, MD  rosuvastatin (CRESTOR) 40 MG tablet Take 40 mg by mouth daily.   Yes Historical Provider, MD  spironolactone (ALDACTONE) 25 MG tablet Take 1 tablet (25 mg total) by mouth daily. 04/09/15  Yes Lewayne Bunting, MD  Insulin Pen Needle 31G X 8 MM MISC Use to inject insulin daily as instructed 07/10/15   [name], MD  methocarbamol (ROBAXIN) 500 MG tablet Take 1 tablet (500 mg total) by mouth every 8 (eight) hours as needed for muscle spasms. 07/10/15   [name], MD  oxycodone (ROXICODONE) 30 MG immediate release tablet Take 1 tablet (30 mg total) by mouth every 6 (six) hours as needed (seevre pain). 07/12/15   Nicole Pisciotta, PA-C   BP 108/97 mmHg  Pulse 137  Temp(Src) 98.3 F (36.8 C) (Oral)  Resp 20  Ht 6' (1.829 m)  Wt 374 lb (169.645 kg)  BMI 50.71 kg/m2  SpO2 95% Physical Exam  Constitutional: He appears well-developed and well-nourished. No distress.  HENT:  Head: Normocephalic and atraumatic.  Mouth/Throat: Oropharynx is clear and moist. No oropharyngeal exudate.  Eyes: Conjunctivae and EOM are normal. Pupils are equal, round, and reactive to light. Right eye exhibits no discharge. Left eye exhibits no discharge. No scleral icterus.  Neck: Normal range of motion. Neck supple. No JVD present. No thyromegaly present.  Cardiovascular: Regular rhythm, normal heart sounds and intact distal pulses.  Exam reveals  no gallop and no friction rub.   No murmur heard. Tachycardic to 120 bpm  Pulmonary/Chest: Effort normal and breath sounds normal. No respiratory distress. He has no wheezes. He has no rales.  Abdominal: Soft. Bowel sounds are normal. He exhibits no distension and no mass. There is no tenderness.  Morbidly obese  Genitourinary:  The scrotum is very tender, very swollen, there is a very small area to the base of the left side of the scrotum towards the anus which is open and draining a small amount of blood, on palpation alert amount of purulent foul-smelling bloody drainage comes out  Musculoskeletal: Normal range of motion. He exhibits no edema or tenderness.  Lymphadenopathy:    He has no cervical adenopathy.  Neurological: He is alert. Coordination normal.  Skin: Skin is warm and dry. No rash noted. No erythema.  Psychiatric: He has a normal mood and affect. His behavior is normal.  Nursing note and vitals reviewed.   ED Course  Procedures (including critical care time) Labs Review Labs Reviewed  URINALYSIS, ROUTINE W REFLEX MICROSCOPIC (NOT AT [facility]) - Abnormal; Notable for the following:    Glucose, UA 100 (*)    All other components within normal limits  BASIC METABOLIC PANEL - Abnormal; Notable for the following:    Sodium 132 (*)    Chloride 93 (*)    Glucose, Bld 397 (*)    BUN 24 (*)    Creatinine, Ser 1.79 (*)    GFR calc non Af Amer 39 (*)    GFR calc Af Amer 45 (*)    Anion gap 16 (*)    All other components within normal limits  CBC - Abnormal; Notable for the following:    WBC 21.9 (*)    Hemoglobin 12.0 (*)    HCT 36.7 (*)    MCH 25.6 (*)    All other components within normal limits  I-STAT CG4 LACTIC ACID, ED - Abnormal; Notable for the following:    Lactic Acid, Venous 2.33 (*)    All other components within normal limits  CULTURE, BLOOD (ROUTINE X 2)  CULTURE, BLOOD (ROUTINE X 2)  I-STAT CG4 LACTIC ACID, ED    Imaging Review Ct Pelvis W  Contrast  09/08/2015  CLINICAL DATA:  65 year old male  with scrotal swelling and pain. Patient has been unable to urinate last night. EXAM: CT PELVIS WITH CONTRAST TECHNIQUE: Multidetector CT imaging of the pelvis was performed using the standard protocol following the bolus administration of intravenous contrast. CONTRAST:  55mL OMNIPAQUE IOHEXOL 300 MG/ML  SOLN COMPARISON:  CT dated 07/12/2015 FINDINGS: Evaluation of this exam is limited in the absence of intravenous contrast. There is a small left hydrocele. A 3 mm focus of extratesticular calcification noted in left scrotum. Ultrasound may provide better evaluation of the scrotum and testicles. There is diffuse thickening of the posterior scrotal wall. An irregular masslike tissue is noted extending from the posterior scrotal wall to the posterior perineal subcutaneous fat. This mass has an exophytic component with irregular surface measuring approximately 5.5 x 11.3 cm. No discrete drainable fluid collection/abscess identified. Although the soft tissue thickening may be sequela of chronic infection/inflammation, the exophytic and irregular component of the tissue is concerning a neoplastic process. Clinical correlation is recommended. The visualized ureters, the urinary bladder, prostate, and seminal vesicles appear grossly unremarkable. No bowel dilatation identified. There is aortoiliac atherosclerotic disease. No lymphadenopathy noted within the pelvis. Mild degenerative changes of lower lumbar spine.  No acute fracture. IMPRESSION: Diffuse thickening of the posterior scrotal wall and perineum with an exophytic and irregular masslike component concerning for underlying neoplasm. Clinical correlation is recommended. No drainable fluid collection identified. Electronically Signed   By: Elgie Collard M.D.   On: 09/08/2015 19:13   I have personally reviewed and evaluated these images and lab results as part of my medical decision-making.    MDM    Final diagnoses:  Sepsis, due to unspecified organism (HCC)  AKI (acute kidney injury) (HCC)  Hyperglycemia    Care was discussed with the radiology techs to allow them to know what to look for, CT scan will be ordered to evaluate the pelvis to see if there is any spreading infection such as Fournier's gangrene, I have drained his abscess of most of the material, he has some residual tenderness and minimal induration, there was approximately 20 mL of purulent bloody fluid that was drained from this wound. I will discuss this with the urologist as well after the CT scan. IV antibiotics ordered, IV pain medicine, IV fluids, monitor vital signs.  D/w Urology - they will look at CT 7:10 PM  Urology has seen the patient, the CT scan, they recommend IV antibiotics and admission, I agree with this plan. I discussed the patient's care with the hospitalist, Dr. Lovell Sheehan who will admit the patient to the hospital. I have treated the patient for sepsis given his ongoing tachycardia, he has received insulin for his hyperglycemia, vancomycin and Zosyn for his infection, we have drained the infection that we did not need to do an incision. The patient does appear ill, critical care provided for the patient with sepsis.  CRITICAL CARE Performed by: Vida Roller Total critical care time: 35 minutes Critical care time was exclusive of separately billable procedures and treating other patients. Critical care was necessary to treat or prevent imminent or life-threatening deterioration. Critical care was time spent personally by me on the following activities: development of treatment plan with patient and/or surrogate as well as nursing, discussions with consultants, evaluation of patient's response to treatment, examination of patient, obtaining history from patient or surrogate, ordering and performing treatments and interventions, ordering and review of laboratory studies, ordering and review of radiographic  studies, pulse oximetry and re-evaluation of patient's condition.  Meds given  in ED:  Medications  sodium chloride 0.9 % bolus 1,000 mL (not administered)  fentaNYL (SUBLIMAZE) injection 100 mcg (not administered)  piperacillin-tazobactam (ZOSYN) IVPB 3.375 g (not administered)  sodium chloride 0.9 % bolus 1,000 mL (1,000 mLs Intravenous New Bag/Given 09/08/15 1758)  vancomycin (VANCOCIN) IVPB 1000 mg/200 mL premix (1,000 mg Intravenous New Bag/Given 09/08/15 1758)  HYDROmorphone (DILAUDID) injection 1 mg (1 mg Intravenous Given 09/08/15 1753)  iohexol (OMNIPAQUE) 300 MG/ML solution 75 mL (75 mLs Intravenous Contrast Given 09/08/15 1813)  insulin aspart (novoLOG) injection 10 Units (10 Units Subcutaneous Given 09/08/15 1953)  sodium chloride 0.9 % bolus 1,000 mL (1,000 mLs Intravenous New Bag/Given 09/08/15 1952)    New Prescriptions   No medications on file      Eber Hong, MD 09/08/15 2050

## 2015-09-08 NOTE — ED Notes (Signed)
Pt arrived to room and his abcess sprung a leak and bled in the bed.  It has stopped for the moment.  Chux placed under pt and blood cleaned from skin.  Perineaal area is extremely swollen and red.

## 2015-09-08 NOTE — Consult Note (Addendum)
8:12 PM   [name] 04/08/1951 [id number]  Referring provider: Dr. Philomena Course  Chief Complaint  Patient presents with  . Testicle Pain  . Urinary Retention    HPI: The patient is a 65 year old gentleman with a past medical history of poorly controlled diabetes and perirectal abscess he presented to the hospital today with scrotal pain and swelling. Upon transfer to the emergency room, a scrotal abscess spontaneously drained approximately 20 cc of fluid. He underwent a CT scan which shows no residual fluid collection or signs of Fournier's gangrene did show a thickened mass in his scrotum and the posterior aspect that was chronic inflammation versus a neoplasm. The patient denies fevers or chills at this time though his vital suggest sepsis and and he has a leukocytosis. He has never had this problem with his scrotum before though he does note he has a history of what he refers to his testicular swelling. He does not believe he is ever had a urinary tract infection before. His urinalysis today is clear.   PMH: Past Medical History  Diagnosis Date  . Hypertension   . Chronic diastolic CHF (congestive heart failure) (HCC)   . CAD (coronary artery disease)     a. s/p PCI to RCA in 2012. b. prior cath in 01/2014 with elevated L/RH pressures, mild-mod CAD of LAD/LCx with patent RCA, normal EF, c/b CIN/CHF.  [name] PAF (paroxysmal atrial fibrillation) (HCC)     TEE DCCV 09/23/2014  . Anemia   . Pilonidal cyst 1980's; 01/25/2013  . Neuropathy (HCC)   . History of blood transfusion ~ 1954    "related to OR"  . Carpal tunnel syndrome, bilateral   . Morbid obesity (HCC)   . Hyperlipidemia   . OSA (obstructive sleep apnea)     "I wear nasal prongs; haven't been using prongs recently" (09/19/2014)  . Type II diabetes mellitus (HCC)   . Arthritis     "hands and lower back" (09/19/2014)  . Chronic lower back pain   . Chronic kidney disease (CKD), stage III (moderate)   . Cellulitis   . Hypoxia      a. Qualified for home O2 at DC in 09/2014.  [name] Physical deconditioning     Surgical History: Past Surgical History  Procedure Laterality Date  . Abdominal surgery  ~ 1954    BENIGN TUMOR REMOVED  . Debridement  foot Left     debriding diabetic foot ulcers  . Cataract extraction w/phaco Right 11/15/2012    Procedure: CATARACT EXTRACTION PHACO AND INTRAOCULAR LENS PLACEMENT (IOC);  Surgeon: Shade Flood, MD;  Location: [facility] OR;  Service: Ophthalmology;  Laterality: Right;  . Cataract extraction w/phaco Left 11/29/2012    Procedure: CATARACT EXTRACTION PHACO AND INTRAOCULAR LENS PLACEMENT (IOC);  Surgeon: Shade Flood, MD;  Location: [facility] OR;  Service: Ophthalmology;  Laterality: Left;  . Pilonidal cyst excision N/A 01/08/2013    Procedure: CYST EXCISION PILONIDAL EXTENSIVE;  Surgeon: Axel Filler, MD;  Location: [facility] OR;  Service: General;  Laterality: N/A;  . Foreign body removal Right 2014    heel,  splinter removal   . Pars plana vitrectomy Left 06/05/2013    Procedure: PARS PLANA VITRECTOMY WITH 23 GAUGE with Endolaser(constellation);  Surgeon: Shade Flood, MD;  Location: [facility] OR;  Service: Ophthalmology;  Laterality: Left;  . Left and right heart catheterization with coronary angiogram N/A 01/31/2014    Procedure: LEFT AND RIGHT HEART CATHETERIZATION WITH CORONARY ANGIOGRAM;  Surgeon: Micheline Chapman, MD;  Location: [facility]  CATH LAB;  Service: Cardiovascular;  Laterality: N/A;  . Tonsillectomy    . Appendectomy    . Eye surgery    . Coronary angioplasty with stent placement  August 2012    RCA DES - [facility]  . Cardioversion  2010    [name] 09/19/2014  . Pilonidal cyst excision  1980's    "in [place]"  . Tee without cardioversion N/A 09/23/2014    Procedure: TRANSESOPHAGEAL ECHOCARDIOGRAM (TEE);  Surgeon: Lewayne Bunting, MD;  Location: [facility] ENDOSCOPY;  Service: Cardiovascular;  Laterality: N/A;  . Cardioversion N/A 09/23/2014    Procedure: CARDIOVERSION;   Surgeon: Lewayne Bunting, MD;  Location: [facility] ENDOSCOPY;  Service: Cardiovascular;  Laterality: N/A;  . Cardioversion N/A 12/11/2014    Procedure: CARDIOVERSION;  Surgeon: Thurmon Fair, MD;  Location: [facility] ENDOSCOPY;  Service: Cardiovascular;  Laterality: N/A;    Home Medications:    Medication List    ASK your doctor about these medications        amiodarone 200 MG tablet  Commonly known as:  PACERONE  Take 200 mg by mouth daily.     diltiazem 120 MG 24 hr capsule  Commonly known as:  CARDIZEM CD  Take 1 capsule (120 mg total) by mouth daily.     furosemide 80 MG tablet  Commonly known as:  LASIX  Take 1 tablet (80 mg total) by mouth 2 (two) times daily.     gabapentin 300 MG capsule  Commonly known as:  NEURONTIN  Take 1,200 mg by mouth 2 (two) times daily.     Insulin Detemir 100 UNIT/ML Pen  Commonly known as:  LEVEMIR FLEXTOUCH  Inject 20 Units into the skin daily.     Insulin Pen Needle 31G X 8 MM Misc  Use to inject insulin daily as instructed     insulin regular human CONCENTRATED 500 UNIT/ML injection  Commonly known as:  HUMULIN R  Inject 0.2 mLs (100 Units total) into the skin 2 (two) times daily with a meal.     lisinopril 10 MG tablet  Commonly known as:  PRINIVIL,ZESTRIL  Take 1 tablet (10 mg total) by mouth daily.     methocarbamol 500 MG tablet  Commonly known as:  ROBAXIN  Take 1 tablet (500 mg total) by mouth every 8 (eight) hours as needed for muscle spasms.     metoprolol tartrate 25 MG tablet  Commonly known as:  LOPRESSOR  Take 1 tablet (25 mg total) by mouth 2 (two) times daily.     multivitamin with minerals Tabs tablet  Take 1 tablet by mouth daily.     NATURAL BALANCE TEARS OP  Place 1 drop into both eyes daily as needed (for dry eyes).     oxycodone 30 MG immediate release tablet  Commonly known as:  ROXICODONE  Take 1 tablet (30 mg total) by mouth every 6 (six) hours as needed (seevre pain).     potassium chloride SA 20 MEQ tablet   Commonly known as:  KLOR-CON M20  Take 1 tablet (20 mEq total) by mouth 2 (two) times daily.     rivaroxaban 20 MG Tabs tablet  Commonly known as:  XARELTO  Take 1 tablet (20 mg total) by mouth daily with supper.     rosuvastatin 40 MG tablet  Commonly known as:  CRESTOR  Take 40 mg by mouth daily.     spironolactone 25 MG tablet  Commonly known as:  ALDACTONE  Take 1 tablet (  25 mg total) by mouth daily.        Allergies: No Known Allergies  Family History: Family History  Problem Relation Age of Onset  . Adopted: Yes  . Other Other     PT ADOPTED    Social History:  reports that he quit smoking about 32 years ago. His smoking use included Cigarettes. He has a 20 pack-year smoking history. He has never used smokeless tobacco. He reports that he does not drink alcohol or use illicit drugs.  ROS: 12 point ROS otherwise negative except per HPI                                        Physical Exam: BP 108/97 mmHg  Pulse 137  Temp(Src) 98.3 F (36.8 C) (Oral)  Resp 20  Ht 6' (1.829 m)  Wt 374 lb (169.645 kg)  BMI 50.71 kg/m2  SpO2 95%  Constitutional:  Alert and oriented, No acute distress. HEENT: [place] AT, moist mucus membranes.  Trachea midline, no masses. Cardiovascular: No clubbing, cyanosis, or edema. Respiratory: Normal respiratory effort, no increased work of breathing. GI: Abdomen is soft, nontender, nondistended, no abdominal masses GU: No CVA tenderness. Normal phallus. The patient does have a thickened posterior wall of the scrotum. This spontaneously drained abscess is no longer draining. There is no fluctuance or crepitus. His no signs of Fournier's gangrene. He has no overt signs of cellulitis. Exam is nontender to palpation. Skin: No rashes, bruises or suspicious lesions. Lymph: No cervical or inguinal adenopathy. Neurologic: Grossly intact, no focal deficits, moving all 4 extremities. Psychiatric: Normal mood and  affect.  Laboratory Data: Lab Results  Component Value Date   WBC 21.9* 09/08/2015   HGB 12.0* 09/08/2015   HCT 36.7* 09/08/2015   MCV 78.4 09/08/2015   PLT 317 09/08/2015    Lab Results  Component Value Date   CREATININE 1.79* 09/08/2015    Lab Results  Component Value Date   PSA 0.68 10/01/2008    No results found for: TESTOSTERONE  Lab Results  Component Value Date   HGBA1C 10.5* 07/09/2015    Urinalysis    Component Value Date/Time   COLORURINE YELLOW 09/08/2015 1639   APPEARANCEUR CLEAR 09/08/2015 1639   LABSPEC 1.010 09/08/2015 1639   PHURINE 6.0 09/08/2015 1639   GLUCOSEU 100* 09/08/2015 1639   HGBUR NEGATIVE 09/08/2015 1639   BILIRUBINUR NEGATIVE 09/08/2015 1639   KETONESUR NEGATIVE 09/08/2015 1639   PROTEINUR NEGATIVE 09/08/2015 1639   UROBILINOGEN 1.0 11/27/2014 0755   NITRITE NEGATIVE 09/08/2015 1639   LEUKOCYTESUR NEGATIVE 09/08/2015 1639    Pertinent Imaging: CLINICAL DATA: 65 year old male with scrotal swelling and pain. Patient has been unable to urinate last night.  EXAM: CT PELVIS WITH CONTRAST  TECHNIQUE: Multidetector CT imaging of the pelvis was performed using the standard protocol following the bolus administration of intravenous contrast.  CONTRAST: 31mL OMNIPAQUE IOHEXOL 300 MG/ML SOLN  COMPARISON: CT dated 07/12/2015  FINDINGS: Evaluation of this exam is limited in the absence of intravenous contrast.  There is a small left hydrocele. A 3 mm focus of extratesticular calcification noted in left scrotum. Ultrasound may provide better evaluation of the scrotum and testicles. There is diffuse thickening of the posterior scrotal wall. An irregular masslike tissue is noted extending from the posterior scrotal wall to the posterior perineal subcutaneous fat. This mass has an exophytic component with irregular surface measuring approximately  5.5 x 11.3 cm. No discrete drainable fluid collection/abscess identified.  Although the soft tissue thickening may be sequela of chronic infection/inflammation, the exophytic and irregular component of the tissue is concerning a neoplastic process. Clinical correlation is recommended.  The visualized ureters, the urinary bladder, prostate, and seminal vesicles appear grossly unremarkable.  No bowel dilatation identified.  There is aortoiliac atherosclerotic disease. No lymphadenopathy noted within the pelvis.  Mild degenerative changes of lower lumbar spine. No acute fracture.  IMPRESSION: Diffuse thickening of the posterior scrotal wall and perineum with an exophytic and irregular masslike component concerning for underlying neoplasm. Clinical correlation is recommended. No drainable fluid collection identified.  Assessment & Plan:    1. Scrotal abscess This has spontaneously drained with no residual evidence of fluid collection or signs of Fournier's gangrene. I recommend admission to the hospitalists for treatment for his apparent sepsis with broad-spectrum antibiotics pending culture and sensitivity results. No further urologic intervention indicated at this time as there is no areas that need surgical debridement. He will need to follow-up with a scrotal ultrasound as an outpatient in approximately one month to ensure resolution of the chronic inflammation seen in the posterior aspect of the scrotum on CT scan.  2. Presumed sepsis  As above   Hildred Laser, MD

## 2015-09-08 NOTE — Progress Notes (Signed)
Pharmacy Antibiotic Note  [name] is a 65 y.o. male admitted on 09/08/2015 with sepsis.  Pharmacy has been consulted for vancomycin dosing. Give vancomycin 1g and Zosyn x 1 in the ED. Will give another vancomycin dose to complete load. Afebrile, WBC elevated at 21.9. SCr elevated at 1.79, normalized CrCl ~17ml/min.  Plan: Give vancomycin 1.5g IV x 1 to complete load dose, then start 1,750mg  IV Q24 Start Zosyn 3.375 gm IV q8h (4 hour infusion) Monitor clinical picture, renal function, VT at Css F/U C&S, abx deescalation / LOT   Height: 6' (182.9 cm) Weight: (!) 374 lb (169.645 kg) IBW/kg (Calculated) : 77.6  Temp (24hrs), Avg:98.3 F (36.8 C), Min:98.3 F (36.8 C), Max:98.3 F (36.8 C)   Recent Labs Lab 09/08/15 1709 09/08/15 1711 09/08/15 2015  WBC 21.9*  --   --   CREATININE 1.79*  --   --   LATICACIDVEN  --  2.33* 2.00    Estimated Creatinine Clearance: 68.3 mL/min (by C-G formula based on Cr of 1.79).    No Known Allergies  Antimicrobials this admission: Zosyn 2/27 >>  Vancomycin 2/27 >>  Dose adjustments this admission: n/a  Microbiology results: 2/27 BCx:   Thank you for allowing pharmacy to be a part of this patient's care.  Armandina Stammer 09/08/2015 9:04 PM

## 2015-09-09 LAB — GLUCOSE, CAPILLARY
GLUCOSE-CAPILLARY: 478 mg/dL — AB (ref 65–99)
GLUCOSE-CAPILLARY: 516 mg/dL — AB (ref 65–99)
GLUCOSE-CAPILLARY: 438 mg/dL — AB (ref 65–99)
GLUCOSE-CAPILLARY: 342 mg/dL — AB (ref 65–99)
GLUCOSE-CAPILLARY: 314 mg/dL — AB (ref 65–99)
GLUCOSE-CAPILLARY: 233 mg/dL — AB (ref 65–99)
GLUCOSE-CAPILLARY: 206 mg/dL — AB (ref 65–99)
GLUCOSE-CAPILLARY: 203 mg/dL — AB (ref 65–99)
Glucose-Capillary: 487 mg/dL — ABNORMAL HIGH (ref 65–99)
Glucose-Capillary: 428 mg/dL — ABNORMAL HIGH (ref 65–99)
Glucose-Capillary: 440 mg/dL — ABNORMAL HIGH (ref 65–99)
Glucose-Capillary: 300 mg/dL — ABNORMAL HIGH (ref 65–99)

## 2015-09-09 LAB — CBC
HCT: 33.1 % — ABNORMAL LOW (ref 39.0–52.0)
Hemoglobin: 11.1 g/dL — ABNORMAL LOW (ref 13.0–17.0)
MCH: 26.5 pg (ref 26.0–34.0)
MCHC: 33.5 g/dL (ref 30.0–36.0)
MCV: 79 fL (ref 78.0–100.0)
PLATELETS: 318 10*3/uL (ref 150–400)
RBC: 4.19 MIL/uL — AB (ref 4.22–5.81)
RDW: 15 % (ref 11.5–15.5)
WBC: 18 10*3/uL — AB (ref 4.0–10.5)

## 2015-09-09 LAB — BASIC METABOLIC PANEL
Anion gap: 12 (ref 5–15)
BUN: 25 mg/dL — AB (ref 6–20)
CALCIUM: 8.2 mg/dL — AB (ref 8.9–10.3)
CHLORIDE: 92 mmol/L — AB (ref 101–111)
CO2: 23 mmol/L (ref 22–32)
CREATININE: 1.88 mg/dL — AB (ref 0.61–1.24)
GFR, EST AFRICAN AMERICAN: 42 mL/min — AB (ref >60)
GFR, EST NON AFRICAN AMERICAN: 36 mL/min — AB (ref >60)
Glucose, Bld: 586 mg/dL (ref 65–99)
Potassium: 4.2 mmol/L (ref 3.5–5.1)
SODIUM: 127 mmol/L — AB (ref 135–145)

## 2015-09-09 MED ORDER — SODIUM CHLORIDE 0.9 % IV SOLN
INTRAVENOUS | Status: DC
  Administered 2015-09-09: 7 [IU]/h via INTRAVENOUS
  Administered 2015-09-09: 8.6 [IU]/h via INTRAVENOUS
  Administered 2015-09-09: 4.6 [IU]/h via INTRAVENOUS
  Administered 2015-09-09: 3.6 [IU]/h via INTRAVENOUS
  Administered 2015-09-10: 6.4 [IU]/h via INTRAVENOUS
  Filled 2015-09-09: qty 2.5

## 2015-09-09 MED ORDER — INSULIN ASPART 100 UNIT/ML ~~LOC~~ SOLN: 0.0000 [IU] | Freq: Three times a day (TID) | SUBCUTANEOUS | Status: DC

## 2015-09-09 MED ORDER — DEXTROSE-NACL 5-0.45 % IV SOLN: INTRAVENOUS | Status: DC

## 2015-09-09 MED ORDER — HYDROMORPHONE HCL 1 MG/ML IJ SOLN
0.5000 mg | Freq: Four times a day (QID) | INTRAMUSCULAR | Status: DC | PRN
  Administered 2015-09-09 – 2015-09-17 (×26): 1 mg via INTRAVENOUS
  Filled 2015-09-09 (×26): qty 1

## 2015-09-09 MED ORDER — DEXTROSE 50 % IV SOLN: 25.0000 mL | INTRAVENOUS | Status: DC | PRN

## 2015-09-09 MED ORDER — OXYCODONE HCL 5 MG PO TABS: 5.0000 mg | ORAL_TABLET | ORAL | Status: DC | PRN

## 2015-09-09 MED ORDER — OXYCODONE HCL 5 MG PO TABS
15.0000 mg | ORAL_TABLET | ORAL | Status: DC | PRN
  Administered 2015-09-09: 5 mg via ORAL
  Administered 2015-09-09: 25 mg via ORAL
  Administered 2015-09-09: 20 mg via ORAL
  Administered 2015-09-09 – 2015-09-12 (×12): 25 mg via ORAL
  Administered 2015-09-12: 15 mg via ORAL
  Administered 2015-09-12 (×3): 25 mg via ORAL
  Administered 2015-09-12: 10 mg via ORAL
  Administered 2015-09-13 – 2015-09-17 (×20): 25 mg via ORAL
  Filled 2015-09-09 (×2): qty 5
  Filled 2015-09-09: qty 3
  Filled 2015-09-09 (×32): qty 5
  Filled 2015-09-09: qty 3
  Filled 2015-09-09 (×3): qty 5

## 2015-09-09 MED ORDER — SODIUM CHLORIDE 0.9 % IV SOLN
INTRAVENOUS | Status: DC
  Administered 2015-09-09 – 2015-09-11 (×3): via INTRAVENOUS

## 2015-09-09 NOTE — Progress Notes (Signed)
PROGRESS NOTE  [name] EOF:[id number] DOB: 06/15/1951 DOA: 09/08/2015 PCP: [name], MD Outpatient Specialists:    LOS: 1 day   Brief Narrative: 65 y.o. male with Multiple Medical Problems including Paroxysmal Atrial Fibrillation on Xarelto Rx, DM2, HTN, Chronic Diastolic CHF who presents to the ED with 4 days of worsening pain and swelling of his Scrotum.  Assessment & Plan: Active Problems:   Diabetes mellitus type 2, uncontrolled, with complications (HCC)   OBESITY-MORBID BMI 54   Essential hypertension, benign   Atrial fibrillation- DCCV 2010   Long term (current) use of anticoagulants   Chronic diastolic congestive heart failure (HCC)   CKD (chronic kidney disease) stage 3, GFR 30-59 ml/min   Sepsis (HCC)   Scrotal wall abscess   AKI (acute kidney injury) (HCC)   Sepsis (HCC)/Scrotal wall abscess - continue IV antibiotics with Vancomycin and Zosyn - Urology evaluated patient while hospitalized, no need for interventions  Atrial fibrillation - patient's CHA2DS2-VASc Score for Stroke Risk is 3, continue Diltiazem, Amiodarone, Metoprolol and Xarelto - telemetry   AKI on CKD III - Cr close to baseline, slightly higher due to sepsis, continue to monitor  Chronic diastolic heart failure - Most recent 2-D echo was in May 2016 showed ejection fraction of 50-55% - continue Lasix and spironolactone   Morbid obesity  DM - Most recent hemoglobin A1c was 10.5 in December - Patient persistently elevated CBGs in-house, he was asked to bring his U500 from home - start glucostabilizer this morning   DVT prophylaxis: on Xarelto Code Status: Full Family Communication: no family bedside Disposition Plan: home when ready  Barriers for discharge: IV antibiotics  Consultants:   Urology   Procedures:   None   Antimicrobials:  Vancomycin 2/27 >  Zosyn 2/27 >>   Subjective: - feeling better, pain improved - denies chest  pain / palpitations, no nausea/vomiting.   Objective: Filed Vitals:   09/08/15 2205 09/09/15 0559 09/09/15 0729 09/09/15 1205  BP: 108/51 108/52 106/48 129/64  Pulse: 88 89 75 73  Temp: 99 F (37.2 C) 97.6 F (36.4 C) 98.4 F (36.9 C) 98.5 F (36.9 C)  TempSrc: Oral Oral Oral Oral  Resp: 20  18 16   Height:      Weight: 167.559 kg (369 lb 6.4 oz)     SpO2: 98% 96% 94% 97%    Intake/Output Summary (Last 24 hours) at 09/09/15 1304 Last data filed at 09/09/15 0622  Gross per 24 hour  Intake   1730 ml  Output    400 ml  Net   1330 ml   Filed Weights   09/08/15 1407 09/08/15 2205  Weight: 169.645 kg (374 lb) 167.559 kg (369 lb 6.4 oz)   Examination: BP 129/64 mmHg  Pulse 73  Temp(Src) 98.5 F (36.9 C) (Oral)  Resp 16  Ht 6' (1.829 m)  Wt 167.559 kg (369 lb 6.4 oz)  BMI 50.09 kg/m2  SpO2 97% General exam: obese male in NAD Respiratory system: Clear. No increased work of breathing. No wheezing, no crackles. Difficult exam due to body habitus Cardiovascular system: regular rate and rhythm, no murmurs, gallops. No JVD. No peripheral edema.  Gastrointestinal system: Abdomen is obese, soft and nontender. Normal bowel sounds heard. Central nervous system: AxOx3. No focal deficits GU: mild scrotal swelling and erythema on left, mildly tender to palpation Extremities: No clubbing/cyanosis Skin: no rashes  Data Reviewed: I have personally reviewed following labs and imaging studies  CBC:  Recent Labs Lab 09/08/15 1709  09/09/15 0913  WBC 21.9* 18.0*  HGB 12.0* 11.1*  HCT 36.7* 33.1*  MCV 78.4 79.0  PLT 317 318   Basic Metabolic Panel:  Recent Labs Lab 09/08/15 1709 09/09/15 0913  NA 132* 127*  K 4.5 4.2  CL 93* 92*  CO2 23 23  GLUCOSE 397* 586*  BUN 24* 25*  CREATININE 1.79* 1.88*  CALCIUM 9.1 8.2*   CBG:  Recent Labs Lab 09/08/15 2220 09/09/15 0757 09/09/15 1105 09/09/15 1200  GLUCAP 375* 478* 487* 516*   Urine analysis:    Component Value  Date/Time   COLORURINE YELLOW 09/08/2015 1639   APPEARANCEUR CLEAR 09/08/2015 1639   LABSPEC 1.010 09/08/2015 1639   PHURINE 6.0 09/08/2015 1639   GLUCOSEU 100* 09/08/2015 1639   HGBUR NEGATIVE 09/08/2015 1639   BILIRUBINUR NEGATIVE 09/08/2015 1639   KETONESUR NEGATIVE 09/08/2015 1639   PROTEINUR NEGATIVE 09/08/2015 1639   UROBILINOGEN 1.0 11/27/2014 0755   NITRITE NEGATIVE 09/08/2015 1639   LEUKOCYTESUR NEGATIVE 09/08/2015 1639   Radiology Studies: Ct Pelvis W Contrast  09/08/2015  CLINICAL DATA:  65 year old male with scrotal swelling and pain. Patient has been unable to urinate last night. EXAM: CT PELVIS WITH CONTRAST TECHNIQUE: Multidetector CT imaging of the pelvis was performed using the standard protocol following the bolus administration of intravenous contrast. CONTRAST:  31mL OMNIPAQUE IOHEXOL 300 MG/ML  SOLN COMPARISON:  CT dated 07/12/2015 FINDINGS: Evaluation of this exam is limited in the absence of intravenous contrast. There is a small left hydrocele. A 3 mm focus of extratesticular calcification noted in left scrotum. Ultrasound may provide better evaluation of the scrotum and testicles. There is diffuse thickening of the posterior scrotal wall. An irregular masslike tissue is noted extending from the posterior scrotal wall to the posterior perineal subcutaneous fat. This mass has an exophytic component with irregular surface measuring approximately 5.5 x 11.3 cm. No discrete drainable fluid collection/abscess identified. Although the soft tissue thickening may be sequela of chronic infection/inflammation, the exophytic and irregular component of the tissue is concerning a neoplastic process. Clinical correlation is recommended. The visualized ureters, the urinary bladder, prostate, and seminal vesicles appear grossly unremarkable. No bowel dilatation identified. There is aortoiliac atherosclerotic disease. No lymphadenopathy noted within the pelvis. Mild degenerative changes of  lower lumbar spine.  No acute fracture. IMPRESSION: Diffuse thickening of the posterior scrotal wall and perineum with an exophytic and irregular masslike component concerning for underlying neoplasm. Clinical correlation is recommended. No drainable fluid collection identified. Electronically Signed   By: Elgie Collard M.D.   On: 09/08/2015 19:13   Scheduled Meds: . sodium chloride   Intravenous STAT  . amiodarone  200 mg Oral Daily  . diltiazem  120 mg Oral Daily  . furosemide  80 mg Oral BID  . gabapentin  1,200 mg Oral BID  . insulin detemir  20 Units Subcutaneous Daily  . lisinopril  10 mg Oral Daily  . metoprolol tartrate  25 mg Oral BID  . multivitamin with minerals  1 tablet Oral Daily  . piperacillin-tazobactam (ZOSYN)  IV  3.375 g Intravenous Q8H  . potassium chloride SA  20 mEq Oral Daily  . rivaroxaban  20 mg Oral Q supper  . rosuvastatin  40 mg Oral Daily  . spironolactone  25 mg Oral Daily  . vancomycin  1,750 mg Intravenous Q24H   Continuous Infusions: . sodium chloride 75 mL/hr at 09/08/15 2238  . sodium chloride 50 mL/hr at 09/09/15 1158  . dextrose 5 % and 0.45%  NaCl    . insulin (NOVOLIN-R) infusion 4.6 Units/hr (09/09/15 1159)    Pamella Pert, MD, PhD [name] Pager [phone]  If 7PM-7AM, please contact night-coverage [email or web address] Password [facility] 09/09/2015, 1:04 PM

## 2015-09-09 NOTE — Progress Notes (Signed)
Inpatient Diabetes Program Recommendations  AACE/ADA: New Consensus Statement on Inpatient Glycemic Control (2015)  Target Ranges:  Prepandial:   less than 140 mg/dL      Peak postprandial:   less than 180 mg/dL (1-2 hours)      Critically ill patients:  140 - 180 mg/dL   Results for [name], [name] (MRN [id number]) as of 09/09/2015 08:26  Ref. Range 09/08/2015 22:20 09/09/2015 07:57  Glucose-Capillary Latest Ref Range: 65-99 mg/dL 235 (H) 573 (H)   Review of Glycemic Control  Diabetes history: DM 2 Outpatient Diabetes medications: Levemir 20 units, Humulin U-500 100 units TID Current orders for Inpatient glycemic control: Levemir 20 units Daily, Noovlog Resistant + HS  Inpatient Diabetes Program Recommendations: Insulin - Basal: Patient's glucose in the 300-400 range. Patient takes Humulin U-500 concentrated insulin at home in addition to Levemir. Please reorder part of patient's U-500 dose while inpatient. Humulin U-500 75 units instead of 100 units TID with meals.  Thanks,  Christena Deem RN, MSN, [facility] Inpatient Diabetes Coordinator Team Pager [phone] (8a-5p)

## 2015-09-09 NOTE — Progress Notes (Signed)
CRITICAL VALUE ALERT  Critical value received:  Glucose 586  Date of notification:  2/28  Time of notification:  1056  Critical value read back: yes  Nurse who received alert:  Janell Quiet  MD notified (1st page):  Gherghe  Time of first page:  1100  MD notified (2nd page):  Time of second page:  Responding MD:  Elvera Lennox  Time MD responded:  1100

## 2015-09-09 NOTE — Progress Notes (Signed)
NURSING PROGRESS NOTE  [name] [id number] Admission Data: 09/09/2015 2:59 AM Attending Provider: Ron Parker, MD FBP:[id number],DYJWL, MD Code Status: FULL  Allergies:  Review of patient's allergies indicates no known allergies. Past Medical History:   has a past medical history of Hypertension; Chronic diastolic CHF (congestive heart failure) (HCC); CAD (coronary artery disease); PAF (paroxysmal atrial fibrillation) (HCC); Anemia; Pilonidal cyst (1980's; 01/25/2013); Neuropathy (HCC); History of blood transfusion (~ 1954); Carpal tunnel syndrome, bilateral; Morbid obesity (HCC); Hyperlipidemia; OSA (obstructive sleep apnea); Type II diabetes mellitus (HCC); Arthritis; Chronic lower back pain; Chronic kidney disease (CKD), stage III (moderate); Cellulitis; Hypoxia; and Physical deconditioning. Past Surgical History:   has past surgical history that includes Abdominal surgery (~ 1954); Debridement  foot (Left); Cataract extraction w/PHACO (Right, 11/15/2012); Cataract extraction w/PHACO (Left, 11/29/2012); Pilonidal cyst excision (N/A, 01/08/2013); Foreign Body Removal (Right, 2014); Pars plana vitrectomy (Left, 06/05/2013); left and right heart catheterization with coronary angiogram (N/A, 01/31/2014); Tonsillectomy; Appendectomy; Eye surgery; Coronary angioplasty with stent (August 2012); Cardioversion (2010); Pilonidal cyst excision (1980's); TEE without cardioversion (N/A, 09/23/2014); Cardioversion (N/A, 09/23/2014); and Cardioversion (N/A, 12/11/2014). Social History:   reports that he quit smoking about 32 years ago. His smoking use included Cigarettes. He has a 20 pack-year smoking history. He has never used smokeless tobacco. He reports that he does not drink alcohol or use illicit drugs.  [name] is a 65 y.o. male patient admitted from ED:   Last Documented Vital Signs: Blood pressure 108/51, pulse 88, temperature 99 F (37.2 C), temperature source Oral, resp. rate 20, height 6'  (1.829 m), weight 169.645 kg (374 lb), SpO2 98 %.  Cardiac Monitoring: Box # 17 in place. Cardiac monitor yields:normal sinus rhythm.  IV Fluids:  IV in place, occlusive dsg intact without redness, IV cath hand right, condition patent and no redness normal saline.   Skin: dry with redness to the left groin area with leaking abcess  Patient orientated to room. Information packet given to patient. Admission inpatient armband information verified with patient to include name and date of birth and placed on patient arm. Side rails up x 2, fall assessment and education completed with patient. Patient able to verbalize understanding of risk associated with falls and verbalized understanding to call for assistance before getting out of bed. Call light within reach. Patient able to voice and demonstrate understanding of unit orientation instructions.    Will continue to evaluate and treat per MD orders.  Sue Lush RN, BSN

## 2015-09-09 NOTE — Care Management Note (Signed)
Case Management Note  Patient Details  Name: [name] MRN: [id number] Date of Birth: June 24, 1951  Subjective/Objective:                  Date- 09-09-15 Initial Assessment Spoke with patient at the bedside. Introduced self as Sports coach and explained role in discharge planning and how to be reached.  Verified patient lives in  [place] with sister Verified patient anticipates to go home with family  At time of discharge.  Patient reports no DME. Expressed potential need for no other DME.  Patient confirmed needing help with their medication because some meds that had no copays recently went up to $1.25. Informed patient that copay is better than what I could assist him with. Patient drives to MD appointments.  Verified patient has no PCP. He states that he was kicked out of Dr Thayer Headings practice for cancelling appointments. Patient agreed to go to [facility] after discharge, appointment made. Patient states they currently receive HH services through no one.   Admission Comments:  Admitted with scrotal abcess. Receiving IV ABX.  Lawerance Sabal RN BSN CM [phone]   Action/Plan:  Appointment made at [facility].  Expected Discharge Date:                  Expected Discharge Plan:  Home/Self Care  In-House Referral:     Discharge planning Services  CM Consult  Post Acute Care Choice:    Choice offered to:     DME Arranged:    DME Agency:     HH Arranged:    HH Agency:     Status of Service:  In process, will continue to follow  Medicare Important Message Given:    Date Medicare IM Given:    Medicare IM give by:    Date Additional Medicare IM Given:    Additional Medicare Important Message give by:     If discussed at Long Length of Stay Meetings, dates discussed:    Additional Comments:  Lawerance Sabal, RN 09/09/2015, 2:10 PM

## 2015-09-10 LAB — GLUCOSE, CAPILLARY
GLUCOSE-CAPILLARY: 129 mg/dL — AB (ref 65–99)
GLUCOSE-CAPILLARY: 139 mg/dL — AB (ref 65–99)
GLUCOSE-CAPILLARY: 151 mg/dL — AB (ref 65–99)
GLUCOSE-CAPILLARY: 155 mg/dL — AB (ref 65–99)
GLUCOSE-CAPILLARY: 182 mg/dL — AB (ref 65–99)
GLUCOSE-CAPILLARY: 195 mg/dL — AB (ref 65–99)
Glucose-Capillary: 176 mg/dL — ABNORMAL HIGH (ref 65–99)
Glucose-Capillary: 139 mg/dL — ABNORMAL HIGH (ref 65–99)
Glucose-Capillary: 137 mg/dL — ABNORMAL HIGH (ref 65–99)
Glucose-Capillary: 305 mg/dL — ABNORMAL HIGH (ref 65–99)
Glucose-Capillary: 419 mg/dL — ABNORMAL HIGH (ref 65–99)
Glucose-Capillary: 380 mg/dL — ABNORMAL HIGH (ref 65–99)
Glucose-Capillary: 254 mg/dL — ABNORMAL HIGH (ref 65–99)

## 2015-09-10 LAB — BASIC METABOLIC PANEL
Anion gap: 13 (ref 5–15)
BUN: 25 mg/dL — ABNORMAL HIGH (ref 6–20)
CALCIUM: 8.4 mg/dL — AB (ref 8.9–10.3)
CO2: 21 mmol/L — ABNORMAL LOW (ref 22–32)
CREATININE: 1.78 mg/dL — AB (ref 0.61–1.24)
Chloride: 95 mmol/L — ABNORMAL LOW (ref 101–111)
GFR calc non Af Amer: 39 mL/min — ABNORMAL LOW (ref >60)
GFR, EST AFRICAN AMERICAN: 45 mL/min — AB (ref >60)
Glucose, Bld: 156 mg/dL — ABNORMAL HIGH (ref 65–99)
Potassium: 4.1 mmol/L (ref 3.5–5.1)
SODIUM: 129 mmol/L — AB (ref 135–145)

## 2015-09-10 LAB — CBC
HEMATOCRIT: 32.4 % — AB (ref 39.0–52.0)
HEMOGLOBIN: 10.6 g/dL — AB (ref 13.0–17.0)
MCH: 25.4 pg — ABNORMAL LOW (ref 26.0–34.0)
MCHC: 32.7 g/dL (ref 30.0–36.0)
MCV: 77.7 fL — AB (ref 78.0–100.0)
Platelets: 326 10*3/uL (ref 150–400)
RBC: 4.17 MIL/uL — ABNORMAL LOW (ref 4.22–5.81)
RDW: 14.4 % (ref 11.5–15.5)
WBC: 16.3 10*3/uL — ABNORMAL HIGH (ref 4.0–10.5)

## 2015-09-10 MED ORDER — INSULIN REGULAR HUMAN (CONC) 500 UNIT/ML ~~LOC~~ SOPN
80.0000 [IU] | PEN_INJECTOR | Freq: Three times a day (TID) | SUBCUTANEOUS | Status: DC
  Administered 2015-09-10 (×2): 80 [IU] via SUBCUTANEOUS
  Filled 2015-09-10: qty 3

## 2015-09-10 NOTE — Progress Notes (Signed)
PROGRESS NOTE  [name] SMO:[id number] DOB: 1950/07/30 DOA: 09/08/2015 PCP: [name], MD Outpatient Specialists:    LOS: 2 days   Brief Narrative: 65 y.o. male with Multiple Medical Problems including Paroxysmal Atrial Fibrillation on Xarelto Rx, DM2, HTN, Chronic Diastolic CHF who presents to the ED with 4 days of worsening pain and swelling of his Scrotum.  Assessment & Plan: Active Problems:   Diabetes mellitus type 2, uncontrolled, with complications (HCC)   OBESITY-MORBID BMI 54   Essential hypertension, benign   Atrial fibrillation- DCCV 2010   Long term (current) use of anticoagulants   Chronic diastolic congestive heart failure (HCC)   CKD (chronic kidney disease) stage 3, GFR 30-59 ml/min   Sepsis (HCC)   Scrotal wall abscess   AKI (acute kidney injury) (HCC)   Sepsis (HCC)/Scrotal wall abscess - continue IV antibiotics with Vancomycin and Zosyn. May be able to narrow antibiotics on 3/2 at 48 hours if cultures remain negative  - Urology evaluated patient while hospitalized, no need for interventions - Increased scrotal swelling last night, improved this morning, may need repeat imaging if he continues to have tenderness or swelling comes back - CT scan raised the possibility of neoplasm with diffuse thickening of the posterior scrotal wall and perineum with an exophytic and irregular masslike component, urology evaluated, recommended follow-up with a scrotal ultrasound as an outpatient in approximately one month to ensure resolution of the "chronic inflammation" seen on the CT scan. This was discussed with the patient on 3/1  Atrial fibrillation - patient's CHA2DS2-VASc Score for Stroke Risk is 3, continue Diltiazem, Amiodarone, Metoprolol and Xarelto - telemetry   AKI on CKD III - Cr close to baseline, slightly higher due to sepsis, continue to monitor  Chronic diastolic heart failure - Most recent 2-D echo was in May 2016 showed ejection fraction of  50-55% - continue Lasix and spironolactone   Morbid obesity  DM - Most recent hemoglobin A1c was 10.5 in December - Patient is on U500, on 2/28 his CBGs have been sky high into the 4 500s, he was started and the glucose stabilizer, much improved this morning, resume his U500, discontinue insulin infusion   DVT prophylaxis: on Xarelto Code Status: Full Family Communication: no family bedside Disposition Plan: home when ready  Barriers for discharge: IV antibiotics  Consultants:   Urology   Procedures:   None   Antimicrobials:  Vancomycin 2/27 >  Zosyn 2/27 >>   Subjective: - Endorses worsening swelling in his scrotum last night, very tender, swelling has gone down this morning and pain has subsided.  Objective: Filed Vitals:   09/09/15 2217 09/10/15 0218 09/10/15 0615 09/10/15 1027  BP: 127/47 121/42 129/67 120/61  Pulse: 89 94 101 101  Temp: 97.8 F (36.6 C)  98.6 F (37 C) 98.6 F (37 C)  TempSrc: Oral  Oral Oral  Resp: 18 18 20 18   Height:      Weight:      SpO2: 95% 97% 94% 93%    Intake/Output Summary (Last 24 hours) at 09/10/15 1440 Last data filed at 09/10/15 1036  Gross per 24 hour  Intake 2160.83 ml  Output   1225 ml  Net 935.83 ml   Filed Weights   09/08/15 1407 09/08/15 2205  Weight: 169.645 kg (374 lb) 167.559 kg (369 lb 6.4 oz)   Examination: BP 120/61 mmHg  Pulse 101  Temp(Src) 98.6 F (37 C) (Oral)  Resp 18  Ht 6' (1.829 m)  Wt 167.559 kg (  369 lb 6.4 oz)  BMI 50.09 kg/m2  SpO2 93% General exam: obese male in NAD Respiratory system: Clear. No increased work of breathing. No wheezing, no crackles. Difficult exam due to body habitus Cardiovascular system: regular rate and rhythm, no murmurs, gallops. No JVD. No peripheral edema.  Gastrointestinal system: Abdomen is obese, soft and nontender. Normal bowel sounds heard. Central nervous system: AxOx3. No focal deficits GU: mild scrotal swelling and  erythema on left, mildly tender to palpation Extremities: No clubbing/cyanosis Skin: no rashes  Data Reviewed: I have personally reviewed following labs and imaging studies  CBC:  Recent Labs Lab 09/08/15 1709 09/09/15 0913 09/10/15 0554  WBC 21.9* 18.0* 16.3*  HGB 12.0* 11.1* 10.6*  HCT 36.7* 33.1* 32.4*  MCV 78.4 79.0 77.7*  PLT 317 318 326   Basic Metabolic Panel:  Recent Labs Lab 09/08/15 1709 09/09/15 0913 09/10/15 0845  NA 132* 127* 129*  K 4.5 4.2 4.1  CL 93* 92* 95*  CO2 23 23 21*  GLUCOSE 397* 586* 156*  BUN 24* 25* 25*  CREATININE 1.79* 1.88* 1.78*  CALCIUM 9.1 8.2* 8.4*   CBG:  Recent Labs Lab 09/10/15 0647 09/10/15 0752 09/10/15 0910 09/10/15 1021 09/10/15 1220  GLUCAP 137* 155* 195* 305* 419*   Urine analysis:    Component Value Date/Time   COLORURINE YELLOW 09/08/2015 1639   APPEARANCEUR CLEAR 09/08/2015 1639   LABSPEC 1.010 09/08/2015 1639   PHURINE 6.0 09/08/2015 1639   GLUCOSEU 100* 09/08/2015 1639   HGBUR NEGATIVE 09/08/2015 1639   BILIRUBINUR NEGATIVE 09/08/2015 1639   KETONESUR NEGATIVE 09/08/2015 1639   PROTEINUR NEGATIVE 09/08/2015 1639   UROBILINOGEN 1.0 11/27/2014 0755   NITRITE NEGATIVE 09/08/2015 1639   LEUKOCYTESUR NEGATIVE 09/08/2015 1639   Radiology Studies: Ct Pelvis W Contrast  09/08/2015  CLINICAL DATA:  65 year old male with scrotal swelling and pain. Patient has been unable to urinate last night. EXAM: CT PELVIS WITH CONTRAST TECHNIQUE: Multidetector CT imaging of the pelvis was performed using the standard protocol following the bolus administration of intravenous contrast. CONTRAST:  102mL OMNIPAQUE IOHEXOL 300 MG/ML  SOLN COMPARISON:  CT dated 07/12/2015 FINDINGS: Evaluation of this exam is limited in the absence of intravenous contrast. There is a small left hydrocele. A 3 mm focus of extratesticular calcification noted in left scrotum. Ultrasound may provide better evaluation of the scrotum and testicles. There is  diffuse thickening of the posterior scrotal wall. An irregular masslike tissue is noted extending from the posterior scrotal wall to the posterior perineal subcutaneous fat. This mass has an exophytic component with irregular surface measuring approximately 5.5 x 11.3 cm. No discrete drainable fluid collection/abscess identified. Although the soft tissue thickening may be sequela of chronic infection/inflammation, the exophytic and irregular component of the tissue is concerning a neoplastic process. Clinical correlation is recommended. The visualized ureters, the urinary bladder, prostate, and seminal vesicles appear grossly unremarkable. No bowel dilatation identified. There is aortoiliac atherosclerotic disease. No lymphadenopathy noted within the pelvis. Mild degenerative changes of lower lumbar spine.  No acute fracture. IMPRESSION: Diffuse thickening of the posterior scrotal wall and perineum with an exophytic and irregular masslike component concerning for underlying neoplasm. Clinical correlation is recommended. No drainable fluid collection identified. Electronically Signed   By: Elgie Collard M.D.   On: 09/08/2015 19:13   Scheduled Meds: . amiodarone  200 mg Oral Daily  . diltiazem  120 mg Oral Daily  . furosemide  80 mg Oral BID  . gabapentin  1,200 mg Oral  BID  . insulin detemir  20 Units Subcutaneous Daily  . insulin regular human CONCENTRATED  80 Units Subcutaneous TID WC  . lisinopril  10 mg Oral Daily  . metoprolol tartrate  25 mg Oral BID  . multivitamin with minerals  1 tablet Oral Daily  . piperacillin-tazobactam (ZOSYN)  IV  3.375 g Intravenous Q8H  . potassium chloride SA  20 mEq Oral Daily  . rivaroxaban  20 mg Oral Q supper  . rosuvastatin  40 mg Oral Daily  . spironolactone  25 mg Oral Daily  . vancomycin  1,750 mg Intravenous Q24H   Continuous Infusions: . sodium chloride 75 mL/hr at 09/08/15 2238  . sodium chloride 50 mL/hr at 09/09/15 1909  . insulin (NOVOLIN-R)  infusion 2.7 Units/hr (09/10/15 0650)    Pamella Pert, MD, PhD [name] Pager [phone]  If 7PM-7AM, please contact night-coverage [email or web address] Password [email or web address] 09/10/2015, 2:40 PM

## 2015-09-11 ENCOUNTER — Inpatient Hospital Stay: Payer: Medicare Other

## 2015-09-11 DIAGNOSIS — I5032 Chronic diastolic (congestive) heart failure: Secondary | ICD-10-CM

## 2015-09-11 DIAGNOSIS — E1165 Type 2 diabetes mellitus with hyperglycemia: Secondary | ICD-10-CM

## 2015-09-11 DIAGNOSIS — E118 Type 2 diabetes mellitus with unspecified complications: Secondary | ICD-10-CM

## 2015-09-11 DIAGNOSIS — N179 Acute kidney failure, unspecified: Secondary | ICD-10-CM

## 2015-09-11 DIAGNOSIS — N183 Chronic kidney disease, stage 3 (moderate): Secondary | ICD-10-CM

## 2015-09-11 DIAGNOSIS — I481 Persistent atrial fibrillation: Secondary | ICD-10-CM

## 2015-09-11 LAB — BASIC METABOLIC PANEL
Anion gap: 11 (ref 5–15)
BUN: 34 mg/dL — AB (ref 6–20)
CHLORIDE: 98 mmol/L — AB (ref 101–111)
CO2: 22 mmol/L (ref 22–32)
CREATININE: 2.37 mg/dL — AB (ref 0.61–1.24)
Calcium: 8.3 mg/dL — ABNORMAL LOW (ref 8.9–10.3)
GFR calc Af Amer: 32 mL/min — ABNORMAL LOW (ref >60)
GFR, EST NON AFRICAN AMERICAN: 28 mL/min — AB (ref >60)
GLUCOSE: 51 mg/dL — AB (ref 65–99)
POTASSIUM: 4.3 mmol/L (ref 3.5–5.1)
Sodium: 131 mmol/L — ABNORMAL LOW (ref 135–145)

## 2015-09-11 LAB — CBC
HEMATOCRIT: 30 % — AB (ref 39.0–52.0)
Hemoglobin: 10.1 g/dL — ABNORMAL LOW (ref 13.0–17.0)
MCH: 26.6 pg (ref 26.0–34.0)
MCHC: 33.7 g/dL (ref 30.0–36.0)
MCV: 78.9 fL (ref 78.0–100.0)
PLATELETS: 322 10*3/uL (ref 150–400)
RBC: 3.8 MIL/uL — ABNORMAL LOW (ref 4.22–5.81)
RDW: 14.8 % (ref 11.5–15.5)
WBC: 16.3 10*3/uL — ABNORMAL HIGH (ref 4.0–10.5)

## 2015-09-11 LAB — GLUCOSE, CAPILLARY
GLUCOSE-CAPILLARY: 57 mg/dL — AB (ref 65–99)
GLUCOSE-CAPILLARY: 255 mg/dL — AB (ref 65–99)
Glucose-Capillary: 74 mg/dL (ref 65–99)
Glucose-Capillary: 249 mg/dL — ABNORMAL HIGH (ref 65–99)
Glucose-Capillary: 165 mg/dL — ABNORMAL HIGH (ref 65–99)

## 2015-09-11 MED ORDER — INSULIN DETEMIR 100 UNIT/ML ~~LOC~~ SOLN
20.0000 [IU] | Freq: Every day | SUBCUTANEOUS | Status: DC
  Administered 2015-09-11 – 2015-09-12 (×2): 20 [IU] via SUBCUTANEOUS
  Filled 2015-09-11 (×2): qty 0.2

## 2015-09-11 MED ORDER — INSULIN REGULAR HUMAN (CONC) 500 UNIT/ML ~~LOC~~ SOPN
75.0000 [IU] | PEN_INJECTOR | Freq: Three times a day (TID) | SUBCUTANEOUS | Status: DC
  Administered 2015-09-11 – 2015-09-12 (×5): 75 [IU] via SUBCUTANEOUS

## 2015-09-11 MED ORDER — INSULIN DETEMIR 100 UNIT/ML ~~LOC~~ SOLN
30.0000 [IU] | Freq: Every day | SUBCUTANEOUS | Status: DC
Start: 2015-09-11 — End: 2015-09-11
  Filled 2015-09-11: qty 0.3

## 2015-09-11 NOTE — Progress Notes (Signed)
Pharmacy Antibiotic Note  [name] is a 65 y.o. male admitted on 09/08/2015 with sepsis.   WBC WNL, Scr stable Blood cultures negative to date  Plan: Continue Vancomycin 1,750mg  IV Q24 Continue  Zosyn 3.375 gm IV q8h (4 hour infusion) Monitor clinical picture, renal function, VT at Css  Consider narrowing therapy?    Height: 6' (182.9 cm) Weight: (!) 369 lb 6.4 oz (167.559 kg) IBW/kg (Calculated) : 77.6  Temp (24hrs), Avg:98.4 F (36.9 C), Min:97.7 F (36.5 C), Max:99.3 F (37.4 C)   Recent Labs Lab 09/08/15 1709 09/08/15 1711 09/08/15 2015 09/09/15 0913 09/10/15 0554 09/10/15 0845 09/11/15 0553  WBC 21.9*  --   --  18.0* 16.3*  --  16.3*  CREATININE 1.79*  --   --  1.88*  --  1.78* 2.37*  LATICACIDVEN  --  2.33* 2.00  --   --   --   --     Estimated Creatinine Clearance: 51.3 mL/min (by C-G formula based on Cr of 2.37).    No Known Allergies  Antimicrobials this admission: Zosyn 2/27 >>  Vancomycin 2/27 >>  Dose adjustments this admission: n/a  Microbiology results: 2/27 BCx: NGTD  Thank you for allowing pharmacy to be a part of this patient's care.  Elwin Sleight 09/11/2015 12:00 PM

## 2015-09-11 NOTE — Care Management Important Message (Signed)
Important Message  Patient Details  Name: [name] MRN: [id number] Date of Birth: 03-Jul-1951   Medicare Important Message Given:  Yes    Kyla Balzarine 09/11/2015, 1:44 PM

## 2015-09-11 NOTE — Progress Notes (Signed)
PROGRESS NOTE  Paul CHAVARIN XBM:[id number] DOB: [birth year] DOA: 09/08/2015 PCP: [name], MD Outpatient Specialists:    LOS: 3 days   Brief Narrative: 65 y.o. male with Multiple Medical Problems including Paroxysmal Atrial Fibrillation on Xarelto Rx, DM2, HTN, Chronic Diastolic CHF who presents to the ED with 4 days of worsening pain and swelling of his Scrotum.  Assessment & Plan: Active Problems:   Diabetes mellitus type 2, uncontrolled, with complications (HCC)   OBESITY-MORBID BMI 54   Essential hypertension, benign   Atrial fibrillation- DCCV 2010   Long term (current) use of anticoagulants   Chronic diastolic congestive heart failure (HCC)   CKD (chronic kidney disease) stage 3, GFR 30-59 ml/min   Sepsis (HCC)   Scrotal wall abscess   AKI (acute kidney injury) (HCC)   Sepsis (HCC)/Scrotal wall abscess - continue IV antibiotics with Vancomycin and Zosyn. Anticipate a narrow coverage in the next 24 hours his white count is still elevated - Urology evaluated patient while hospitalized, no need for interventions - Increased scrotal swelling last night, improved this morning, may need repeat imaging if he continues to have tenderness or swelling comes back - CT scan raised the possibility of neoplasm with diffuse thickening of the posterior scrotal wall and perineum with an exophytic and irregular masslike component, urology evaluated, recommended follow-up with a scrotal ultrasound as an outpatient in approximately one month to ensure resolution of the "chronic inflammation" seen on the CT scan. This was discussed with the patient    Atrial fibrillation - patient's CHA2DS2-VASc Score for Stroke Risk is 3, continue Diltiazem, Amiodarone, Metoprolol and Xarelto - telemetry   AKI on CKD III, baseline 1.4 Creatinine continues to get worse, DC Aldactone, Lasix, start patient on IV hydration  Chronic diastolic heart failure - Most recent 2-D echo was in May 2016 showed  ejection fraction of 50-55% Old Lasix and spironolactone   Morbid obesity Body mass index is 50.09 kg/(m^2).   DM - Most recent hemoglobin A1c was 10.5 in December - Patient is on U500, on 2/28 his CBGs have been sky high into the 4 500s, he was started and the glucose stabilizer, CBG continues to be elevated, increase Levemir   DVT prophylaxis: on Xarelto Code Status: Full Family Communication: no family bedside Disposition Plan: Anticipate discharge in 2-3 days, PT eval Barriers for discharge: IV antibiotics  Consultants:   Urology   Procedures:   None   Antimicrobials:  Vancomycin 2/27 >  Zosyn 2/27 >>   Subjective:  Patient sitting comfortably in the chair, blood pressure soft, denies any chest pain or shortness of breath. Still has some scrotal pain  Objective: Filed Vitals:   09/10/15 1027 09/10/15 1526 09/10/15 2226 09/11/15 0507  BP: 120/61 99/48 121/68 90/66  Pulse: 101 67 79 73  Temp: 98.6 F (37 C) 98.5 F (36.9 C) 98 F (36.7 C) 97.7 F (36.5 C)  TempSrc: Oral Oral Oral Oral  Resp: 18 18 17 17   Height:      Weight:      SpO2: 93% 92% 97% 94%    Intake/Output Summary (Last 24 hours) at 09/11/15 0752 Last data filed at 09/11/15 11/11/15  Gross per 24 hour  Intake 2130.83 ml  Output    900 ml  Net 1230.83 ml   Filed Weights   09/08/15 1407 09/08/15 2205  Weight: 169.645 kg (374 lb) 167.559 kg (369 lb 6.4 oz)   Examination: BP 90/66 mmHg  Pulse 73  Temp(Src) 97.7 F (36.5 C) (  Oral)  Resp 17  Ht 6' (1.829 m)  Wt 167.559 kg (369 lb 6.4 oz)  BMI 50.09 kg/m2  SpO2 94% General exam: obese male in NAD Respiratory system: Clear. No increased work of breathing. No wheezing, no crackles. Difficult exam due to body habitus Cardiovascular system: regular rate and rhythm, no murmurs, gallops. No JVD. No peripheral edema.  Gastrointestinal system: Abdomen is obese, soft and nontender. Normal bowel sounds  heard. Central nervous system: AxOx3. No focal deficits GU: mild scrotal swelling and erythema on left, mildly tender to palpation Extremities: No clubbing/cyanosis Skin: no rashes  Data Reviewed: I have personally reviewed following labs and imaging studies  CBC:  Recent Labs Lab 09/08/15 1709 09/09/15 0913 09/10/15 0554 09/11/15 0553  WBC 21.9* 18.0* 16.3* 16.3*  HGB 12.0* 11.1* 10.6* 10.1*  HCT 36.7* 33.1* 32.4* 30.0*  MCV 78.4 79.0 77.7* 78.9  PLT 317 318 326 322   Basic Metabolic Panel:  Recent Labs Lab 09/08/15 1709 09/09/15 0913 09/10/15 0845 09/11/15 0553  NA 132* 127* 129* 131*  K 4.5 4.2 4.1 4.3  CL 93* 92* 95* 98*  CO2 23 23 21* 22  GLUCOSE 397* 586* 156* 51*  BUN 24* 25* 25* 34*  CREATININE 1.79* 1.88* 1.78* 2.37*  CALCIUM 9.1 8.2* 8.4* 8.3*   CBG:  Recent Labs Lab 09/10/15 0910 09/10/15 1021 09/10/15 1220 09/10/15 1701 09/10/15 2205  GLUCAP 195* 305* 419* 380* 254*   Urine analysis:    Component Value Date/Time   COLORURINE YELLOW 09/08/2015 1639   APPEARANCEUR CLEAR 09/08/2015 1639   LABSPEC 1.010 09/08/2015 1639   PHURINE 6.0 09/08/2015 1639   GLUCOSEU 100* 09/08/2015 1639   HGBUR NEGATIVE 09/08/2015 1639   BILIRUBINUR NEGATIVE 09/08/2015 1639   KETONESUR NEGATIVE 09/08/2015 1639   PROTEINUR NEGATIVE 09/08/2015 1639   UROBILINOGEN 1.0 11/27/2014 0755   NITRITE NEGATIVE 09/08/2015 1639   LEUKOCYTESUR NEGATIVE 09/08/2015 1639   Radiology Studies: No results found. Scheduled Meds: . amiodarone  200 mg Oral Daily  . diltiazem  120 mg Oral Daily  . gabapentin  1,200 mg Oral BID  . insulin detemir  20 Units Subcutaneous Daily  . insulin regular human CONCENTRATED  80 Units Subcutaneous TID WC  . lisinopril  10 mg Oral Daily  . metoprolol tartrate  25 mg Oral BID  . multivitamin with minerals  1 tablet Oral Daily  . piperacillin-tazobactam (ZOSYN)  IV  3.375 g Intravenous Q8H  . potassium chloride SA  20 mEq Oral Daily  .  rivaroxaban  20 mg Oral Q supper  . rosuvastatin  40 mg Oral Daily  . vancomycin  1,750 mg Intravenous Q24H   Continuous Infusions: . sodium chloride 75 mL/hr at 09/08/15 2238  . insulin (NOVOLIN-R) infusion 2.7 Units/hr (09/10/15 0650)    Richarda Overlie MD  [name] If 7PM-7AM, please contact night-coverage [email or web address] Password [facility] 09/11/2015, 7:52 AM

## 2015-09-11 NOTE — Progress Notes (Signed)
Hypoglycemic Event  CBG: 57 at 0802  Treatment: 15 GM carbohydrate snack  Symptoms: None  Follow-up CBG: YYQM:2500 CBG Result: 74  Possible Reasons for Event: Unknown  Comments/MD notified: Dr. Susie Cassette paged    [name], [name]

## 2015-09-12 ENCOUNTER — Inpatient Hospital Stay: Payer: Medicare Other

## 2015-09-12 ENCOUNTER — Inpatient Hospital Stay (HOSPITAL_COMMUNITY): Payer: Medicare Other

## 2015-09-12 DIAGNOSIS — Z794 Long term (current) use of insulin: Secondary | ICD-10-CM

## 2015-09-12 LAB — COMPREHENSIVE METABOLIC PANEL
ALBUMIN: 3.1 g/dL — AB (ref 3.5–5.0)
ALT: 17 U/L (ref 17–63)
AST: 20 U/L (ref 15–41)
Alkaline Phosphatase: 64 U/L (ref 38–126)
Anion gap: 12 (ref 5–15)
BUN: 31 mg/dL — AB (ref 6–20)
CHLORIDE: 98 mmol/L — AB (ref 101–111)
CO2: 24 mmol/L (ref 22–32)
Calcium: 8.6 mg/dL — ABNORMAL LOW (ref 8.9–10.3)
Creatinine, Ser: 1.97 mg/dL — ABNORMAL HIGH (ref 0.61–1.24)
GFR calc Af Amer: 40 mL/min — ABNORMAL LOW (ref >60)
GFR, EST NON AFRICAN AMERICAN: 34 mL/min — AB (ref >60)
Glucose, Bld: 122 mg/dL — ABNORMAL HIGH (ref 65–99)
POTASSIUM: 4.4 mmol/L (ref 3.5–5.1)
SODIUM: 134 mmol/L — AB (ref 135–145)
TOTAL PROTEIN: 7.4 g/dL (ref 6.5–8.1)
Total Bilirubin: 0.5 mg/dL (ref 0.3–1.2)

## 2015-09-12 LAB — CBC
HCT: 32.5 % — ABNORMAL LOW (ref 39.0–52.0)
Hemoglobin: 10.2 g/dL — ABNORMAL LOW (ref 13.0–17.0)
MCH: 24.8 pg — ABNORMAL LOW (ref 26.0–34.0)
MCHC: 31.4 g/dL (ref 30.0–36.0)
MCV: 79.1 fL (ref 78.0–100.0)
PLATELETS: 362 10*3/uL (ref 150–400)
RBC: 4.11 MIL/uL — AB (ref 4.22–5.81)
RDW: 14.7 % (ref 11.5–15.5)
WBC: 13.7 10*3/uL — AB (ref 4.0–10.5)

## 2015-09-12 LAB — GLUCOSE, CAPILLARY
GLUCOSE-CAPILLARY: 128 mg/dL — AB (ref 65–99)
GLUCOSE-CAPILLARY: 276 mg/dL — AB (ref 65–99)
GLUCOSE-CAPILLARY: 127 mg/dL — AB (ref 65–99)
GLUCOSE-CAPILLARY: 137 mg/dL — AB (ref 65–99)

## 2015-09-12 MED ORDER — INSULIN DETEMIR 100 UNIT/ML ~~LOC~~ SOLN
25.0000 [IU] | Freq: Every day | SUBCUTANEOUS | Status: DC
  Filled 2015-09-12: qty 0.25

## 2015-09-12 NOTE — Progress Notes (Signed)
PROGRESS NOTE  [name] WEX:[id number] DOB: [birth year] DOA: 09/08/2015 PCP: [name], MD Outpatient Specialists:    LOS: 4 days   Brief Narrative: 65 y.o. male with Multiple Medical Problems including Paroxysmal Atrial Fibrillation on Xarelto Rx, DM2, HTN, Chronic Diastolic CHF who presents to the ED with 4 days of worsening pain and swelling of his Scrotum.  Assessment & Plan: Active Problems:   Diabetes mellitus type 2, uncontrolled, with complications (HCC)   OBESITY-MORBID BMI 54   Essential hypertension, benign   Atrial fibrillation- DCCV 2010   Long term (current) use of anticoagulants   Chronic diastolic congestive heart failure (HCC)   CKD (chronic kidney disease) stage 3, GFR 30-59 ml/min   Sepsis (HCC)   Scrotal wall abscess   AKI (acute kidney injury) (HCC)   Sepsis (HCC)/Scrotal wall abscess - continue IV antibiotics with Vancomycin and Zosyn. Blood culture negative to date  Anticipate a narrow coverage in the next 24 hours, white count slowly improving, continues to have a lot of drainage and bleeding - Urology evaluated patient while hospitalized, ruled out foruneir's gangrene   Patient complains of worsening tenderness pain and bloody drainage - CT scan raised the possibility of neoplasm with diffuse thickening of the posterior scrotal wall and perineum with an exophytic and irregular masslike component, urology evaluated, recommended follow-up with a scrotal ultrasound as an outpatient in approximately one month to ensure resolution of the "chronic inflammation" seen on the CT scan. This was discussed with the patient  Repeating scrotal ultrasound, discussed with Dr Vernie Ammons, he will see the patient   Atrial fibrillation - patient's CHA2DS2-VASc Score for Stroke Risk is 3, continue Diltiazem, Amiodarone, Metoprolol and Xarelto - telemetry   AKI on CKD III, baseline 1.4 Creatinine slightly improved today, 2.37> 1.97, continue gentle IV fluids, held  Aldactone, Lasix,   Chronic diastolic heart failure - Most recent 2-D echo was in May 2016 showed ejection fraction of 50-55% Old Lasix and spironolactone   Morbid obesity Body mass index is 50.09 kg/(m^2).   DM - Most recent hemoglobin A1c was 10.5 in December - Patient is on U500, on 2/28 his CBGs have been sky high into the 4- 500s, he was started and the glucose stabilizer, CBG improved  on Levemir 20 units, Humulin R 75 TID, increase Levemir to 25 units   DVT prophylaxis: on Xarelto Code Status: Full Family Communication: no family bedside  Disposition Plan: Urology to reevaluate  Barriers for discharge: IV antibiotics  Consultants:   Urology   Procedures:   None   Antimicrobials:  Vancomycin 2/27 >  Zosyn 2/27 >>   Subjective:  Patient continues to have mucopurulent bloody discharge from his scrotal area  Objective: Filed Vitals:   09/11/15 2147 09/12/15 0200 09/12/15 0556 09/12/15 1022  BP: 154/61 121/97 146/63 134/62  Pulse: 89 86 91 86  Temp: 97.3 F (36.3 C) 97.7 F (36.5 C) 97.5 F (36.4 C)   TempSrc: Oral Oral Oral   Resp: 20 17 20    Height:      Weight:      SpO2: 100% 95% 97% 97%    Intake/Output Summary (Last 24 hours) at 09/12/15 1259 Last data filed at 09/12/15 1200  Gross per 24 hour  Intake 4311.25 ml  Output   1075 ml  Net 3236.25 ml   Filed Weights   09/08/15 1407 09/08/15 2205  Weight: 169.645 kg (374 lb) 167.559 kg (369 lb 6.4 oz)   Examination: BP 134/62 mmHg  Pulse 86  Temp(Src) 97.5 F (36.4 C) (Oral)  Resp 20  Ht 6' (1.829 m)  Wt 167.559 kg (369 lb 6.4 oz)  BMI 50.09 kg/m2  SpO2 97% General exam: obese male in NAD Respiratory system: Clear. No increased work of breathing. No wheezing, no crackles. Difficult exam due to body habitus Cardiovascular system: regular rate and rhythm, no murmurs, gallops. No JVD. No peripheral edema.  Gastrointestinal system: Abdomen is obese, soft  and nontender. Normal bowel sounds heard. Central nervous system: AxOx3. No focal deficits GU: mild scrotal swelling and erythema on left, mildly tender to palpation, mucopurulent discharge Extremities: No clubbing/cyanosis Skin: no rashes  Data Reviewed: I have personally reviewed following labs and imaging studies  CBC:  Recent Labs Lab 09/08/15 1709 09/09/15 0913 09/10/15 0554 09/11/15 0553 09/12/15 0711  WBC 21.9* 18.0* 16.3* 16.3* 13.7*  HGB 12.0* 11.1* 10.6* 10.1* 10.2*  HCT 36.7* 33.1* 32.4* 30.0* 32.5*  MCV 78.4 79.0 77.7* 78.9 79.1  PLT 317 318 326 322 362   Basic Metabolic Panel:  Recent Labs Lab 09/08/15 1709 09/09/15 0913 09/10/15 0845 09/11/15 0553 09/12/15 0711  NA 132* 127* 129* 131* 134*  K 4.5 4.2 4.1 4.3 4.4  CL 93* 92* 95* 98* 98*  CO2 23 23 21* 22 24  GLUCOSE 397* 586* 156* 51* 122*  BUN 24* 25* 25* 34* 31*  CREATININE 1.79* 1.88* 1.78* 2.37* 1.97*  CALCIUM 9.1 8.2* 8.4* 8.3* 8.6*   CBG:  Recent Labs Lab 09/11/15 1133 09/11/15 1721 09/11/15 2148 09/12/15 0805 09/12/15 1130  GLUCAP 249* 255* 165* 128* 276*   Urine analysis:    Component Value Date/Time   COLORURINE YELLOW 09/08/2015 1639   APPEARANCEUR CLEAR 09/08/2015 1639   LABSPEC 1.010 09/08/2015 1639   PHURINE 6.0 09/08/2015 1639   GLUCOSEU 100* 09/08/2015 1639   HGBUR NEGATIVE 09/08/2015 1639   BILIRUBINUR NEGATIVE 09/08/2015 1639   KETONESUR NEGATIVE 09/08/2015 1639   PROTEINUR NEGATIVE 09/08/2015 1639   UROBILINOGEN 1.0 11/27/2014 0755   NITRITE NEGATIVE 09/08/2015 1639   LEUKOCYTESUR NEGATIVE 09/08/2015 1639   Radiology Studies: No results found. Scheduled Meds: . amiodarone  200 mg Oral Daily  . diltiazem  120 mg Oral Daily  . gabapentin  1,200 mg Oral BID  . insulin detemir  20 Units Subcutaneous Daily  . insulin regular human CONCENTRATED  75 Units Subcutaneous TID WC  . metoprolol tartrate  25 mg Oral BID  . multivitamin with minerals  1 tablet Oral Daily  .  piperacillin-tazobactam (ZOSYN)  IV  3.375 g Intravenous Q8H  . potassium chloride SA  20 mEq Oral Daily  . rivaroxaban  20 mg Oral Q supper  . rosuvastatin  40 mg Oral Daily  . vancomycin  1,750 mg Intravenous Q24H   Continuous Infusions: . sodium chloride 75 mL/hr at 09/12/15 0946    Richarda Overlie MD  [name] If 7PM-7AM, please contact night-coverage [email or web address] Password [facility] 09/12/2015, 12:59 PM

## 2015-09-12 NOTE — Progress Notes (Signed)
Patient ID: [name], male   DOB: [birth year], 65 y.o.   MRN: [id number]  Subjective: Patient reports that he is still having some pain in the perineum but that the pain has not worsened but really remains about the same. He continues to have drainage.  Objective: Vital signs in last 24 hours: Temp:  [97.3 F (36.3 C)-97.7 F (36.5 C)] 97.5 F (36.4 C) (03/03 0556) Pulse Rate:  [86-91] 86 (03/03 1022) Resp:  [17-20] 20 (03/03 0556) BP: (121-154)/(61-97) 134/62 mmHg (03/03 1022) SpO2:  [95 %-100 %] 97 % (03/03 1022)A  Intake/Output from previous day: 03/02 0701 - 03/03 0700 In: 3627.9 [P.O.:1180; I.V.:1847.9; IV Piggyback:600] Out: -  Intake/Output this shift: Total I/O In: 1400.8 [P.O.:700; I.V.:700.8] Out: 1075 [Urine:1075]  Past Medical History  Diagnosis Date  . Hypertension   . Chronic diastolic CHF (congestive heart failure) (HCC)   . CAD (coronary artery disease)     a. s/p PCI to RCA in 2012. b. prior cath in 01/2014 with elevated L/RH pressures, mild-mod CAD of LAD/LCx with patent RCA, normal EF, c/b CIN/CHF.  [name] PAF (paroxysmal atrial fibrillation) (HCC)     TEE DCCV 09/23/2014  . Anemia   . Pilonidal cyst 1980's; 01/25/2013  . Neuropathy (HCC)   . History of blood transfusion ~ 1954    "related to OR"  . Carpal tunnel syndrome, bilateral   . Morbid obesity (HCC)   . Hyperlipidemia   . OSA (obstructive sleep apnea)     "I wear nasal prongs; haven't been using prongs recently" (09/19/2014)  . Type II diabetes mellitus (HCC)   . Arthritis     "hands and lower back" (09/19/2014)  . Chronic lower back pain   . Chronic kidney disease (CKD), stage III (moderate)   . Cellulitis   . Hypoxia     a. Qualified for home O2 at DC in 09/2014.  [name] Physical deconditioning     Physical Exam:  Lungs - Normal respiratory effort, chest expands symmetrically.  Abdomen - Soft, non-tender & non-distended. Serosanguineous appearing drainage was noted on the sheets and in his  underwear. His scrotum and testicles revealed no palpable abnormality. The area in the perineum appears to be draining from a spot to the left of midline that has no surrounding erythema, crepitus and no fluctuant areas can be palpated. I was unable to express any purulent fluid from the area of drainage on examination.  Lab Results:  Recent Labs  09/10/15 0554 09/11/15 0553 09/12/15 0711  WBC 16.3* 16.3* 13.7*  HGB 10.6* 10.1* 10.2*  HCT 32.4* 30.0* 32.5*   BMET  Recent Labs  09/11/15 0553 09/12/15 0711  NA 131* 134*  K 4.3 4.4  CL 98* 98*  CO2 22 24  GLUCOSE 51* 122*  BUN 34* 31*  CREATININE 2.37* 1.97*  CALCIUM 8.3* 8.6*   No results for input(s): LABURIN in the last 72 hours. Results for orders placed or performed during the hospital encounter of 09/08/15  Blood culture (routine x 2)     Status: None (Preliminary result)   Collection Time: 09/08/15  6:03 PM  Result Value Ref Range Status   Specimen Description BLOOD RIGHT HAND  Final   Special Requests IN PEDIATRIC BOTTLE 1CC  Final   Culture NO GROWTH 4 DAYS  Final   Report Status PENDING  Incomplete  Blood culture (routine x 2)     Status: None (Preliminary result)   Collection Time: 09/08/15  7:15 PM  Result Value  Ref Range Status   Specimen Description BLOOD LEFT ANTECUBITAL  Final   Special Requests IN PEDIATRIC BOTTLE 3CC  Final   Culture NO GROWTH 4 DAYS  Final   Report Status PENDING  Incomplete    Studies/Results: US Scrotum  09/12/2015  CLINICAL DATA:  Left-sided scrotal pain. EXAM: ULTRASOUND OF SCROTUM TECHNIQUE: Complete ultrasound examination of the testicles, epididymis, and other scrotal structures was performed. COMPARISON:  None. FINDINGS: Right testicle Measurements: 3.1 x 2.9 x 2.0 cm. Doppler flow is noted. 4 mm partially exophytic cyst is noted. No mass or microlithiasis visualized. Left testicle Measurements: 2.7 x 2.3 x 2.0 cm. Doppler flow is noted. No mass or microlithiasis visualized.  Right epididymis:  Normal in size and appearance. Left epididymis:  Normal in size and appearance. Hydrocele:  Small left hydrocele is noted. Varicocele:  None visualized. IMPRESSION: Small left hydrocele. Small right testicular exophytic cyst. No other abnormality seen in the scrotum. Electronically Signed   By: Lupita Raider, M.D.   On: 09/12/2015 14:42   Scrotal ultrasound images were independently reviewed.  Assessment: He appears to have some form of either infectious or neoplastic process involving his perineum. While in the infectious process is more likely in a diabetic then a neoplastic process this has not been ruled out and persistent drainage from the skin is also associated with underlying neoplastic processes. I note that his white blood cell count over the past 4 days has decreased from 18 down to 13.7. His UA was clear so I do not believe that there is any communication with the urethra. My examination today revealed no areas of fluctuance whatsoever. With no areas of fluctuance surgical exploration is not indicated although if he should stop draining he may develop an area of fluctuance and then surgical drainage would be indicated. At this time there is no indication for surgical exploration and although the patient is somewhat frustrated there is little more to do other than optimize his nutrition and give this time to see if it resolves or declares itself.  Plan: Continue monitoring and following CBC  Jacori Mulrooney C 09/12/2015, 4:57 PM

## 2015-09-12 NOTE — Progress Notes (Signed)
Dr. Susie Cassette made aware of patient's lower extremity edema that has increased since yesterday as well as creatinine decrease. Verbal order to decrease NS rate to 29ml/hr. Patient denies any shortness of breath.

## 2015-09-13 ENCOUNTER — Other Ambulatory Visit: Payer: Self-pay | Admitting: Cardiology

## 2015-09-13 DIAGNOSIS — R739 Hyperglycemia, unspecified: Secondary | ICD-10-CM

## 2015-09-13 LAB — CULTURE, BLOOD (ROUTINE X 2)
CULTURE: NO GROWTH
Culture: NO GROWTH

## 2015-09-13 LAB — COMPREHENSIVE METABOLIC PANEL
ALT: 18 U/L (ref 17–63)
AST: 19 U/L (ref 15–41)
Albumin: 3 g/dL — ABNORMAL LOW (ref 3.5–5.0)
Alkaline Phosphatase: 64 U/L (ref 38–126)
Anion gap: 8 (ref 5–15)
BILIRUBIN TOTAL: 0.7 mg/dL (ref 0.3–1.2)
BUN: 19 mg/dL (ref 6–20)
CO2: 26 mmol/L (ref 22–32)
CREATININE: 1.5 mg/dL — AB (ref 0.61–1.24)
Calcium: 8.9 mg/dL (ref 8.9–10.3)
Chloride: 103 mmol/L (ref 101–111)
GFR, EST AFRICAN AMERICAN: 55 mL/min — AB (ref >60)
GFR, EST NON AFRICAN AMERICAN: 48 mL/min — AB (ref >60)
Glucose, Bld: 63 mg/dL — ABNORMAL LOW (ref 65–99)
POTASSIUM: 4.7 mmol/L (ref 3.5–5.1)
Sodium: 137 mmol/L (ref 135–145)
TOTAL PROTEIN: 6.9 g/dL (ref 6.5–8.1)

## 2015-09-13 LAB — CBC
HEMATOCRIT: 32.1 % — AB (ref 39.0–52.0)
Hemoglobin: 10.1 g/dL — ABNORMAL LOW (ref 13.0–17.0)
MCH: 25.3 pg — AB (ref 26.0–34.0)
MCHC: 31.5 g/dL (ref 30.0–36.0)
MCV: 80.5 fL (ref 78.0–100.0)
Platelets: 364 10*3/uL (ref 150–400)
RBC: 3.99 MIL/uL — ABNORMAL LOW (ref 4.22–5.81)
RDW: 14.9 % (ref 11.5–15.5)
WBC: 13.4 10*3/uL — AB (ref 4.0–10.5)

## 2015-09-13 LAB — GLUCOSE, CAPILLARY
GLUCOSE-CAPILLARY: 62 mg/dL — AB (ref 65–99)
GLUCOSE-CAPILLARY: 225 mg/dL — AB (ref 65–99)
GLUCOSE-CAPILLARY: 243 mg/dL — AB (ref 65–99)
Glucose-Capillary: 74 mg/dL (ref 65–99)
Glucose-Capillary: 168 mg/dL — ABNORMAL HIGH (ref 65–99)

## 2015-09-13 MED ORDER — INSULIN REGULAR HUMAN (CONC) 500 UNIT/ML ~~LOC~~ SOPN
70.0000 [IU] | PEN_INJECTOR | Freq: Three times a day (TID) | SUBCUTANEOUS | Status: DC
  Administered 2015-09-13 – 2015-09-17 (×13): 70 [IU] via SUBCUTANEOUS
  Filled 2015-09-13: qty 3

## 2015-09-13 MED ORDER — INSULIN DETEMIR 100 UNIT/ML ~~LOC~~ SOLN
20.0000 [IU] | Freq: Every day | SUBCUTANEOUS | Status: DC
  Filled 2015-09-13: qty 0.2

## 2015-09-13 NOTE — Progress Notes (Signed)
PROGRESS NOTE  [name] NAT:[id number] DOB: July 30, 1950 DOA: 09/08/2015 PCP: [name], MD Outpatient Specialists:    LOS: 5 days   Brief Narrative: 65 y.o. male with Multiple Medical Problems including Paroxysmal Atrial Fibrillation on Xarelto Rx, DM2, HTN, Chronic Diastolic CHF who presents to the ED with 4 days of worsening pain and swelling of his Scrotum.  Assessment & Plan: Active Problems:   Diabetes mellitus type 2, uncontrolled, with complications (HCC)   OBESITY-MORBID BMI 54   Essential hypertension, benign   Atrial fibrillation- DCCV 2010   Long term (current) use of anticoagulants   Chronic diastolic congestive heart failure (HCC)   CKD (chronic kidney disease) stage 3, GFR 30-59 ml/min   Sepsis (HCC)   Scrotal wall abscess   AKI (acute kidney injury) (HCC)   Sepsis (HCC)/Scrotal wall abscess - continue IV antibiotics with Vancomycin and Zosyn. Blood culture negative to date white count slowly improving 21.9>13.4, continues to have a lot of drainage and bleeding - Urology evaluated patient while hospitalized, ruled out foruneir's gangrene   Patient complains of worsening tenderness pain and bloody drainage - CT scan raised the possibility of neoplasm with diffuse thickening of the posterior scrotal wall and perineum with an exophytic and irregular masslike component, urology evaluated, recommended follow-up with a scrotal ultrasound as an outpatient in approximately one month to ensure resolution of the "chronic inflammation" seen on the CT scan. This was discussed with the patient  Patient seen by Dr Vernie Ammons 3/3. Continue current management Scrotal ultrasound shows left hydrocele right testicular exophytic cyst, no abscess Anticipate transition to oral antibiotics tomorrow and discharge home   Atrial fibrillation - patient's CHA2DS2-VASc Score for Stroke Risk is 3, continue Diltiazem, Amiodarone, Metoprolol and Xarelto - telemetry   AKI on CKD III,  baseline 1.4 Creatinine slightly improved today, 2.37> 1.97, continue gentle IV fluids, held Aldactone, Lasix,   Chronic diastolic heart failure - Most recent 2-D echo was in May 2016 showed ejection fraction of 50-55% Hold Lasix and spironolactone   Morbid obesity Body mass index is 50.09 kg/(m^2).   DM - Most recent hemoglobin A1c was 10.5 in December - Patient is on U500,  and Levemir, CBG low this morning, decrease Levemir to 20 units.   DVT prophylaxis: on Xarelto Code Status: Full Family Communication: no family bedside  Disposition Plan: Continue IV antibiotics, local wound care,  Barriers for discharge: IV antibiotics  Consultants:   Urology   Procedures:   None   Antimicrobials:  Vancomycin 2/27 >  Zosyn 2/27 >>   Subjective:  Patient continues to have mucopurulent bloody discharge from his scrotal area, minimal pain this morning  Objective: Filed Vitals:   09/12/15 1022 09/12/15 2223 09/13/15 0135 09/13/15 0523  BP: 134/62 148/86 124/64 144/74  Pulse: 86 88 104 83  Temp:  97.8 F (36.6 C) 98.4 F (36.9 C) 98 F (36.7 C)  TempSrc:  Oral Oral Oral  Resp:   19 18  Height:      Weight:      SpO2: 97% 100% 95% 98%    Intake/Output Summary (Last 24 hours) at 09/13/15 0837 Last data filed at 09/13/15 4270  Gross per 24 hour  Intake 2892.08 ml  Output   1575 ml  Net 1317.08 ml   Filed Weights   09/08/15 1407 09/08/15 2205  Weight: 169.645 kg (374 lb) 167.559 kg (369 lb 6.4 oz)   Examination: BP 144/74 mmHg  Pulse 83  Temp(Src) 98 F (36.7 C) (Oral)  Resp 18  Ht 6' (1.829 m)  Wt 167.559 kg (369 lb 6.4 oz)  BMI 50.09 kg/m2  SpO2 98% General exam: obese male in NAD Respiratory system: Clear. No increased work of breathing. No wheezing, no crackles. Difficult exam due to body habitus Cardiovascular system: regular rate and rhythm, no murmurs, gallops. No JVD. No peripheral edema.  Gastrointestinal  system: Abdomen is obese, soft and nontender. Normal bowel sounds heard. Central nervous system: AxOx3. No focal deficits GU: mild scrotal swelling and erythema on left, mildly tender to palpation, mucopurulent discharge Extremities: No clubbing/cyanosis Skin: no rashes  Data Reviewed: I have personally reviewed following labs and imaging studies  CBC:  Recent Labs Lab 09/09/15 0913 09/10/15 0554 09/11/15 0553 09/12/15 0711 09/13/15 0701  WBC 18.0* 16.3* 16.3* 13.7* 13.4*  HGB 11.1* 10.6* 10.1* 10.2* 10.1*  HCT 33.1* 32.4* 30.0* 32.5* 32.1*  MCV 79.0 77.7* 78.9 79.1 80.5  PLT 318 326 322 362 364   Basic Metabolic Panel:  Recent Labs Lab 09/08/15 1709 09/09/15 0913 09/10/15 0845 09/11/15 0553 09/12/15 0711  NA 132* 127* 129* 131* 134*  K 4.5 4.2 4.1 4.3 4.4  CL 93* 92* 95* 98* 98*  CO2 23 23 21* 22 24  GLUCOSE 397* 586* 156* 51* 122*  BUN 24* 25* 25* 34* 31*  CREATININE 1.79* 1.88* 1.78* 2.37* 1.97*  CALCIUM 9.1 8.2* 8.4* 8.3* 8.6*   CBG:  Recent Labs Lab 09/12/15 1130 09/12/15 1728 09/12/15 2138 09/13/15 0758 09/13/15 0826  GLUCAP 276* 127* 137* 62* 74   Urine analysis:    Component Value Date/Time   COLORURINE YELLOW 09/08/2015 1639   APPEARANCEUR CLEAR 09/08/2015 1639   LABSPEC 1.010 09/08/2015 1639   PHURINE 6.0 09/08/2015 1639   GLUCOSEU 100* 09/08/2015 1639   HGBUR NEGATIVE 09/08/2015 1639   BILIRUBINUR NEGATIVE 09/08/2015 1639   KETONESUR NEGATIVE 09/08/2015 1639   PROTEINUR NEGATIVE 09/08/2015 1639   UROBILINOGEN 1.0 11/27/2014 0755   NITRITE NEGATIVE 09/08/2015 1639   LEUKOCYTESUR NEGATIVE 09/08/2015 1639   Radiology Studies: US Scrotum  09/12/2015  CLINICAL DATA:  Left-sided scrotal pain. EXAM: ULTRASOUND OF SCROTUM TECHNIQUE: Complete ultrasound examination of the testicles, epididymis, and other scrotal structures was performed. COMPARISON:  None. FINDINGS: Right testicle Measurements: 3.1 x 2.9 x 2.0 cm. Doppler flow is noted. 4 mm  partially exophytic cyst is noted. No mass or microlithiasis visualized. Left testicle Measurements: 2.7 x 2.3 x 2.0 cm. Doppler flow is noted. No mass or microlithiasis visualized. Right epididymis:  Normal in size and appearance. Left epididymis:  Normal in size and appearance. Hydrocele:  Small left hydrocele is noted. Varicocele:  None visualized. IMPRESSION: Small left hydrocele. Small right testicular exophytic cyst. No other abnormality seen in the scrotum. Electronically Signed   By: Lupita Raider, M.D.   On: 09/12/2015 14:42   Scheduled Meds: . amiodarone  200 mg Oral Daily  . diltiazem  120 mg Oral Daily  . gabapentin  1,200 mg Oral BID  . insulin detemir  25 Units Subcutaneous Daily  . insulin regular human CONCENTRATED  75 Units Subcutaneous TID WC  . metoprolol tartrate  25 mg Oral BID  . multivitamin with minerals  1 tablet Oral Daily  . piperacillin-tazobactam (ZOSYN)  IV  3.375 g Intravenous Q8H  . potassium chloride SA  20 mEq Oral Daily  . rivaroxaban  20 mg Oral Q supper  . rosuvastatin  40 mg Oral Daily  . vancomycin  1,750 mg Intravenous Q24H   Continuous  Infusions: . sodium chloride 75 mL/hr at 09/13/15 0407    Richarda Overlie MD  [name] If 7PM-7AM, please contact night-coverage [email or web address] Password [facility] 09/13/2015, 8:37 AM

## 2015-09-13 NOTE — Progress Notes (Signed)
Hypoglycemic Event  CBG: 62  Treatment: 15 GM carbohydrate snack  Symptoms: None  Follow-up CBG: Time: 0828 CBG Result: 74    L'ESPERANCE, Rhona Fusilier C

## 2015-09-14 LAB — BASIC METABOLIC PANEL
Anion gap: 10 (ref 5–15)
BUN: 13 mg/dL (ref 6–20)
CHLORIDE: 100 mmol/L — AB (ref 101–111)
CO2: 25 mmol/L (ref 22–32)
CREATININE: 1.35 mg/dL — AB (ref 0.61–1.24)
Calcium: 9 mg/dL (ref 8.9–10.3)
GFR calc Af Amer: >60 mL/min (ref >60)
GFR calc non Af Amer: 54 mL/min — ABNORMAL LOW (ref >60)
GLUCOSE: 276 mg/dL — AB (ref 65–99)
POTASSIUM: 5.1 mmol/L (ref 3.5–5.1)
Sodium: 135 mmol/L (ref 135–145)

## 2015-09-14 LAB — CBC
HEMATOCRIT: 31.8 % — AB (ref 39.0–52.0)
Hemoglobin: 10 g/dL — ABNORMAL LOW (ref 13.0–17.0)
MCH: 25.3 pg — AB (ref 26.0–34.0)
MCHC: 31.4 g/dL (ref 30.0–36.0)
MCV: 80.3 fL (ref 78.0–100.0)
PLATELETS: 375 10*3/uL (ref 150–400)
RBC: 3.96 MIL/uL — ABNORMAL LOW (ref 4.22–5.81)
RDW: 15.1 % (ref 11.5–15.5)
WBC: 13.7 10*3/uL — ABNORMAL HIGH (ref 4.0–10.5)

## 2015-09-14 LAB — GLUCOSE, CAPILLARY
GLUCOSE-CAPILLARY: 241 mg/dL — AB (ref 65–99)
Glucose-Capillary: 294 mg/dL — ABNORMAL HIGH (ref 65–99)
Glucose-Capillary: 377 mg/dL — ABNORMAL HIGH (ref 65–99)

## 2015-09-14 MED ORDER — POTASSIUM CHLORIDE CRYS ER 20 MEQ PO TBCR: 20.0000 meq | EXTENDED_RELEASE_TABLET | Freq: Every day | ORAL | Status: DC

## 2015-09-14 MED ORDER — SODIUM CHLORIDE 0.9 % IV SOLN
1250.0000 mg | Freq: Two times a day (BID) | INTRAVENOUS | Status: DC
  Administered 2015-09-14 – 2015-09-17 (×7): 1250 mg via INTRAVENOUS
  Filled 2015-09-14 (×8): qty 1250

## 2015-09-14 MED ORDER — INSULIN DETEMIR 100 UNIT/ML ~~LOC~~ SOLN
15.0000 [IU] | Freq: Every day | SUBCUTANEOUS | Status: DC
  Administered 2015-09-14 – 2015-09-17 (×4): 15 [IU] via SUBCUTANEOUS
  Filled 2015-09-14 (×4): qty 0.15

## 2015-09-14 NOTE — Progress Notes (Signed)
PT Cancellation Note  Patient Details Name: [name] MRN: [id number] DOB: Nov 10, 1950   Cancelled Treatment:    Reason Eval/Treat Not Completed: Other (comment)   Politely declining PT eval due to pain;   Will follow up later today as time allows;  Otherwise, will follow up for PT tomorrow;   Thank you,  Van Clines, PT  Acute Rehabilitation Services Pager [phone] Office [phone]     Van Clines [facility] 09/14/2015, 10:29 AM

## 2015-09-14 NOTE — Progress Notes (Signed)
Pharmacy Antibiotic Note  [name] is a 65 y.o. male admitted on 09/08/2015 with sepsis.   WBC WNL, Scr stable Blood cultures negative to date  Plan: Increase Vancomycin to 1250 mg iv Q 12 hours with improvement in Scr Continue  Zosyn 3.375 gm IV q8h (4 hour infusion) Monitor clinical picture, renal function, VT at Css  Consider narrowing therapy?    Height: 6' (182.9 cm) Weight: (!) 375 lb (170.099 kg) IBW/kg (Calculated) : 77.6  Temp (24hrs), Avg:97.7 F (36.5 C), Min:97.6 F (36.4 C), Max:97.7 F (36.5 C)   Recent Labs Lab 09/08/15 1711 09/08/15 2015  09/10/15 0554 09/10/15 0845 09/11/15 0553 09/12/15 0711 09/13/15 0701 09/14/15 0704  WBC  --   --   < > 16.3*  --  16.3* 13.7* 13.4* 13.7*  CREATININE  --   --   < >  --  1.78* 2.37* 1.97* 1.50* 1.35*  LATICACIDVEN 2.33* 2.00  --   --   --   --   --   --   --   < > = values in this interval not displayed.  Estimated Creatinine Clearance: 90.8 mL/min (by C-G formula based on Cr of 1.35).    No Known Allergies  Antimicrobials this admission: Zosyn 2/27 >>  Vancomycin 2/27 >>  Dose adjustments this admission: n/a  Microbiology results: 2/27 BCx: NGTD  Thank you for allowing pharmacy to be a part of this patient's care.  Elwin Sleight 09/14/2015 2:19 PM

## 2015-09-14 NOTE — Progress Notes (Signed)
PROGRESS NOTE  [name] WUJ:[id number] DOB: [birth year] DOA: 09/08/2015 PCP: [name], MD Outpatient Specialists:    LOS: 6 days   Brief Narrative: 65 y.o. male with Multiple Medical Problems including Paroxysmal Atrial Fibrillation on Xarelto Rx, DM2, HTN, Chronic Diastolic CHF who presents to the ED with 4 days of worsening pain and swelling of his Scrotum.  Assessment & Plan: Active Problems:   Diabetes mellitus type 2, uncontrolled, with complications (HCC)   OBESITY-MORBID BMI 54   Essential hypertension, benign   Atrial fibrillation- DCCV 2010   Long term (current) use of anticoagulants   Chronic diastolic congestive heart failure (HCC)   CKD (chronic kidney disease) stage 3, GFR 30-59 ml/min   Sepsis (HCC)   Scrotal wall abscess   AKI (acute kidney injury) (HCC)   Hyperglycemia   Sepsis (HCC) resolved/Scrotal wall abscess improving - continue IV antibiotics with Vancomycin and Zosyn. Blood culture negative to date white count slowly improving 21.9>13.4, drainage improving urology recommends to continue observation - Urology   ruled out foruneir's gangrene   - CT scan raised the possibility of neoplasm with diffuse thickening of the posterior scrotal wall and perineum with an exophytic and irregular masslike component, urology evaluated, recommended follow-up with a scrotal ultrasound as an outpatient in approximately one month to ensure resolution of the "chronic inflammation" seen on the CT scan. This was discussed with the patient  Patient seen by Dr Vernie Ammons 3/3, 3/5. Continue current management Scrotal ultrasound shows left hydrocele right testicular exophytic cyst, no abscess Anticipate transition to oral antibiotics tomorrow and discharge home if okay with urology   Atrial fibrillation - patient's CHA2DS2-VASc Score for Stroke Risk is 3, continue Diltiazem, Amiodarone, Metoprolol and Xarelto - telemetry   AKI on CKD III, baseline 1.4 Creatinine slightly  improved today, 2.37> 1.97, continue gentle IV fluids, held Aldactone, Lasix,   Chronic diastolic heart failure - Most recent 2-D echo was in May 2016 showed ejection fraction of 50-55% Hold Lasix and spironolactone   Morbid obesity Body mass index is 50.85 kg/(m^2).   DM - Most recent hemoglobin A1c was 10.5 in December - Patient is on U500,  and Levemir, CBG in the 300s, restart Levemir at 15 units now   DVT prophylaxis: on Xarelto Code Status: Full Family Communication: no family bedside  Disposition Plan: Continue IV antibiotics, Possible CT of the pelvis versus exploration   Barriers for discharge: IV antibiotics  Consultants:   Urology   Procedures:   None   Antimicrobials:  Vancomycin 2/27 >  Zosyn 2/27 >>   Subjective: Patient reports no increase in pain in the perineal region,decreased drainage    Objective: Filed Vitals:   09/13/15 1413 09/13/15 2235 09/14/15 0735 09/14/15 0810  BP: 149/51 167/70  124/58  Pulse: 80 117  79  Temp: 97.7 F (36.5 C) 97.6 F (36.4 C)    TempSrc: Oral Oral    Resp: 19 20    Height:      Weight:   170.099 kg (375 lb)   SpO2: 96% 97%      Intake/Output Summary (Last 24 hours) at 09/14/15 1227 Last data filed at 09/14/15 0900  Gross per 24 hour  Intake 818.75 ml  Output      0 ml  Net 818.75 ml   Filed Weights   09/08/15 1407 09/08/15 2205 09/14/15 0735  Weight: 169.645 kg (374 lb) 167.559 kg (369 lb 6.4 oz) 170.099 kg (375 lb)   Examination: BP 124/58 mmHg  Pulse  79  Temp(Src) 97.6 F (36.4 C) (Oral)  Resp 20  Ht 6' (1.829 m)  Wt 170.099 kg (375 lb)  BMI 50.85 kg/m2  SpO2 97% General exam: obese male in NAD Respiratory system: Clear. No increased work of breathing. No wheezing, no crackles. Difficult exam due to body habitus Cardiovascular system: regular rate and rhythm, no murmurs, gallops. No JVD. No peripheral edema.  Gastrointestinal system: Abdomen is obese,  soft and nontender. Normal bowel sounds heard. Central nervous system: AxOx3. No focal deficits GU: mild scrotal swelling and erythema on left, mildly tender to palpation, mucopurulent discharge Extremities: No clubbing/cyanosis Skin: no rashes  Data Reviewed: I have personally reviewed following labs and imaging studies  CBC:  Recent Labs Lab 09/10/15 0554 09/11/15 0553 09/12/15 0711 09/13/15 0701 09/14/15 0704  WBC 16.3* 16.3* 13.7* 13.4* 13.7*  HGB 10.6* 10.1* 10.2* 10.1* 10.0*  HCT 32.4* 30.0* 32.5* 32.1* 31.8*  MCV 77.7* 78.9 79.1 80.5 80.3  PLT 326 322 362 364 375   Basic Metabolic Panel:  Recent Labs Lab 09/10/15 0845 09/11/15 0553 09/12/15 0711 09/13/15 0701 09/14/15 0704  NA 129* 131* 134* 137 135  K 4.1 4.3 4.4 4.7 5.1  CL 95* 98* 98* 103 100*  CO2 21* 22 24 26 25   GLUCOSE 156* 51* 122* 63* 276*  BUN 25* 34* 31* 19 13  CREATININE 1.78* 2.37* 1.97* 1.50* 1.35*  CALCIUM 8.4* 8.3* 8.6* 8.9 9.0   CBG:  Recent Labs Lab 09/13/15 1142 09/13/15 1636 09/13/15 2233 09/14/15 0747 09/14/15 1137  GLUCAP 225* 243* 168* 294* 377*   Urine analysis:    Component Value Date/Time   COLORURINE YELLOW 09/08/2015 1639   APPEARANCEUR CLEAR 09/08/2015 1639   LABSPEC 1.010 09/08/2015 1639   PHURINE 6.0 09/08/2015 1639   GLUCOSEU 100* 09/08/2015 1639   HGBUR NEGATIVE 09/08/2015 1639   BILIRUBINUR NEGATIVE 09/08/2015 1639   KETONESUR NEGATIVE 09/08/2015 1639   PROTEINUR NEGATIVE 09/08/2015 1639   UROBILINOGEN 1.0 11/27/2014 0755   NITRITE NEGATIVE 09/08/2015 1639   LEUKOCYTESUR NEGATIVE 09/08/2015 1639   Radiology Studies: 09/10/2015 Scrotum  09/12/2015  CLINICAL DATA:  Left-sided scrotal pain. EXAM: ULTRASOUND OF SCROTUM TECHNIQUE: Complete ultrasound examination of the testicles, epididymis, and other scrotal structures was performed. COMPARISON:  None. FINDINGS: Right testicle Measurements: 3.1 x 2.9 x 2.0 cm. Doppler flow is noted. 4 mm partially exophytic cyst is  noted. No mass or microlithiasis visualized. Left testicle Measurements: 2.7 x 2.3 x 2.0 cm. Doppler flow is noted. No mass or microlithiasis visualized. Right epididymis:  Normal in size and appearance. Left epididymis:  Normal in size and appearance. Hydrocele:  Small left hydrocele is noted. Varicocele:  None visualized. IMPRESSION: Small left hydrocele. Small right testicular exophytic cyst. No other abnormality seen in the scrotum. Electronically Signed   By: 11/12/2015, M.D.   On: 09/12/2015 14:42   Scheduled Meds: . amiodarone  200 mg Oral Daily  . diltiazem  120 mg Oral Daily  . gabapentin  1,200 mg Oral BID  . insulin regular human CONCENTRATED  70 Units Subcutaneous TID WC  . metoprolol tartrate  25 mg Oral BID  . multivitamin with minerals  1 tablet Oral Daily  . piperacillin-tazobactam (ZOSYN)  IV  3.375 g Intravenous Q8H  . [START ON 09/16/2015] potassium chloride SA  20 mEq Oral Daily  . rivaroxaban  20 mg Oral Q supper  . rosuvastatin  40 mg Oral Daily  . vancomycin  1,750 mg Intravenous Q24H  Continuous Infusions:    Richarda Overlie MD  [name] If 7PM-7AM, please contact night-coverage [email or web address] Password [facility] 09/14/2015, 12:27 PM

## 2015-09-14 NOTE — Progress Notes (Signed)
Patient ID: [name], male   DOB: 1950/09/19, 65 y.o.   MRN: [id number]  Subjective: Patient reports no increase in pain in the perineal region. He does report that he feels his genitalia are more swollen. He does however report decreased drainage.  Objective: Vital signs in last 24 hours: Temp:  [97.6 F (36.4 C)-97.7 F (36.5 C)] 97.6 F (36.4 C) (03/04 2235) Pulse Rate:  [80-117] 117 (03/04 2235) Resp:  [19-20] 20 (03/04 2235) BP: (149-167)/(51-70) 167/70 mmHg (03/04 2235) SpO2:  [96 %-97 %] 97 % (03/04 2235)A  Intake/Output from previous day: 03/04 0701 - 03/05 0700 In: 818.8 [P.O.:320; I.V.:448.8; IV Piggyback:50] Out: 200 [Urine:200] Intake/Output this shift:    Past Medical History  Diagnosis Date  . Hypertension   . Chronic diastolic CHF (congestive heart failure) (HCC)   . CAD (coronary artery disease)     a. s/p PCI to RCA in 2012. b. prior cath in 01/2014 with elevated L/RH pressures, mild-mod CAD of LAD/LCx with patent RCA, normal EF, c/b CIN/CHF.  [name] PAF (paroxysmal atrial fibrillation) (HCC)     TEE DCCV 09/23/2014  . Anemia   . Pilonidal cyst 1980's; 01/25/2013  . Neuropathy (HCC)   . History of blood transfusion ~ 1954    "related to OR"  . Carpal tunnel syndrome, bilateral   . Morbid obesity (HCC)   . Hyperlipidemia   . OSA (obstructive sleep apnea)     "I wear nasal prongs; haven't been using prongs recently" (09/19/2014)  . Type II diabetes mellitus (HCC)   . Arthritis     "hands and lower back" (09/19/2014)  . Chronic lower back pain   . Chronic kidney disease (CKD), stage III (moderate)   . Cellulitis   . Hypoxia     a. Qualified for home O2 at DC in 09/2014.  [name] Physical deconditioning     Physical Exam:  Lungs - Normal respiratory effort, chest expands symmetrically.  Abdomen - Soft, non-tender, markedly obese & non-distended. GU: He has no scrotal erythema or crepitus. There may be some edema although it is very difficult to tell due to his  marked obesity. Perineum: The area in the perineum remains slightly tender but I am unable to palpate any areas of fluctuance, there is no crepitus and no erythema noted.  Lab Results:  Recent Labs  09/12/15 0711 09/13/15 0701 09/14/15 0704  WBC 13.7* 13.4* 13.7*  HGB 10.2* 10.1* 10.0*  HCT 32.5* 32.1* 31.8*   BMET  Recent Labs  09/12/15 0711 09/13/15 0701  NA 134* 137  K 4.4 4.7  CL 98* 103  CO2 24 26  GLUCOSE 122* 63*  BUN 31* 19  CREATININE 1.97* 1.50*  CALCIUM 8.6* 8.9   No results for input(s): LABURIN in the last 72 hours. Results for orders placed or performed during the hospital encounter of 09/08/15  Blood culture (routine x 2)     Status: None   Collection Time: 09/08/15  6:03 PM  Result Value Ref Range Status   Specimen Description BLOOD RIGHT HAND  Final   Special Requests IN PEDIATRIC BOTTLE 1CC  Final   Culture NO GROWTH 5 DAYS  Final   Report Status 09/13/2015 FINAL  Final  Blood culture (routine x 2)     Status: None   Collection Time: 09/08/15  7:15 PM  Result Value Ref Range Status   Specimen Description BLOOD LEFT ANTECUBITAL  Final   Special Requests IN PEDIATRIC BOTTLE 3CC  Final  Culture NO GROWTH 5 DAYS  Final   Report Status 09/13/2015 FINAL  Final    Studies/Results: No results found.  Assessment: Perineal drainage:  There has been decreased drainage over the past 48 hours. He does still report some discomfort although I can find no areas of fluctuance and no erythema to suggest any form of worsening infection. His white count remained slightly elevated but stable. He continues to experience some discomfort. His genital edema is difficult to assess as to any significant increased due to his marked obesity.  Plan: 1. Continue observation. 2. Possible CT of the pelvis versus exploration if his clinical condition should worsen.  Isam Unrein C 09/14/2015, 7:33 AM

## 2015-09-15 ENCOUNTER — Inpatient Hospital Stay (HOSPITAL_COMMUNITY): Payer: Medicare Other

## 2015-09-15 ENCOUNTER — Encounter (HOSPITAL_COMMUNITY): Payer: Self-pay | Admitting: Radiology

## 2015-09-15 DIAGNOSIS — I10 Essential (primary) hypertension: Secondary | ICD-10-CM

## 2015-09-15 LAB — GLUCOSE, CAPILLARY
GLUCOSE-CAPILLARY: 113 mg/dL — AB (ref 65–99)
GLUCOSE-CAPILLARY: 276 mg/dL — AB (ref 65–99)
GLUCOSE-CAPILLARY: 270 mg/dL — AB (ref 65–99)
GLUCOSE-CAPILLARY: 219 mg/dL — AB (ref 65–99)
Glucose-Capillary: 89 mg/dL (ref 65–99)

## 2015-09-15 LAB — CBC
HEMATOCRIT: 30.8 % — AB (ref 39.0–52.0)
Hemoglobin: 9.9 g/dL — ABNORMAL LOW (ref 13.0–17.0)
MCH: 25.8 pg — ABNORMAL LOW (ref 26.0–34.0)
MCHC: 32.1 g/dL (ref 30.0–36.0)
MCV: 80.4 fL (ref 78.0–100.0)
Platelets: 332 10*3/uL (ref 150–400)
RBC: 3.83 MIL/uL — AB (ref 4.22–5.81)
RDW: 15.2 % (ref 11.5–15.5)
WBC: 14.1 10*3/uL — AB (ref 4.0–10.5)

## 2015-09-15 LAB — COMPREHENSIVE METABOLIC PANEL
ALT: 18 U/L (ref 17–63)
ANION GAP: 10 (ref 5–15)
AST: 16 U/L (ref 15–41)
Albumin: 2.9 g/dL — ABNORMAL LOW (ref 3.5–5.0)
Alkaline Phosphatase: 59 U/L (ref 38–126)
BILIRUBIN TOTAL: 0.6 mg/dL (ref 0.3–1.2)
BUN: 10 mg/dL (ref 6–20)
CO2: 26 mmol/L (ref 22–32)
Calcium: 9.1 mg/dL (ref 8.9–10.3)
Chloride: 102 mmol/L (ref 101–111)
Creatinine, Ser: 1.45 mg/dL — ABNORMAL HIGH (ref 0.61–1.24)
GFR, EST AFRICAN AMERICAN: 58 mL/min — AB (ref >60)
GFR, EST NON AFRICAN AMERICAN: 50 mL/min — AB (ref >60)
Glucose, Bld: 75 mg/dL (ref 65–99)
POTASSIUM: 4.4 mmol/L (ref 3.5–5.1)
Sodium: 138 mmol/L (ref 135–145)
TOTAL PROTEIN: 6.6 g/dL (ref 6.5–8.1)

## 2015-09-15 MED ORDER — SODIUM CHLORIDE 0.9 % IV SOLN
INTRAVENOUS | Status: AC
  Administered 2015-09-15: 12:00:00 via INTRAVENOUS

## 2015-09-15 MED ORDER — IOHEXOL 300 MG/ML  SOLN
100.0000 mL | Freq: Once | INTRAMUSCULAR | Status: AC | PRN
  Administered 2015-09-15: 100 mL via INTRAVENOUS

## 2015-09-15 MED ORDER — INSULIN REGULAR HUMAN (CONC) 500 UNIT/ML ~~LOC~~ SOLN: 80.0000 [IU] | Freq: Three times a day (TID) | SUBCUTANEOUS | Status: DC

## 2015-09-15 MED ORDER — OXYCODONE HCL 10 MG PO TABS: 10.0000 mg | ORAL_TABLET | Freq: Four times a day (QID) | ORAL | Status: DC | PRN

## 2015-09-15 MED ORDER — DOXYCYCLINE MONOHYDRATE 100 MG PO CAPS: 100.0000 mg | ORAL_CAPSULE | Freq: Two times a day (BID) | ORAL | Status: DC

## 2015-09-15 MED ORDER — FUROSEMIDE 80 MG PO TABS: 40.0000 mg | ORAL_TABLET | Freq: Two times a day (BID) | ORAL | Status: DC

## 2015-09-15 MED ORDER — CIPROFLOXACIN HCL 500 MG PO TABS: 500.0000 mg | ORAL_TABLET | Freq: Two times a day (BID) | ORAL | Status: DC

## 2015-09-15 MED ORDER — GABAPENTIN 300 MG PO CAPS: 600.0000 mg | ORAL_CAPSULE | Freq: Two times a day (BID) | ORAL | Status: DC

## 2015-09-15 MED ORDER — LISINOPRIL 10 MG PO TABS: 10.0000 mg | ORAL_TABLET | Freq: Every day | ORAL | Status: DC

## 2015-09-15 NOTE — Progress Notes (Signed)
OT Cancellation Note  Patient Details Name: [name] MRN: [id number] DOB: 23-May-1951   Cancelled Treatment:    Reason Eval/Treat Not Completed: OT screened, no needs identified, will sign off. According to PT and nursing, pt independent in room and pt discussed with PT that he is independent with ADLs and functional mobility at this time. Will sign-off for now. If any new OT needs arise during this admission, please re-order OT. Thank you for the order.   Edwin Cap , MS, OTR/L, CLT Pager: [phone]  09/15/2015, 9:54 AM

## 2015-09-15 NOTE — Progress Notes (Signed)
Patient ID: [name], male   DOB: 28-Jan-1951, 65 y.o.   MRN: [id number]  Subjective: Patient reports decreased drainage. Reports swelling around penis. Pain improved.  Objective: Vital signs in last 24 hours: Temp:  [97.5 F (36.4 C)-98.3 F (36.8 C)] 98.3 F (36.8 C) (03/06 0435) Pulse Rate:  [91-107] 101 (03/06 0435) Resp:  [18-19] 18 (03/06 0435) BP: (127-143)/(47-55) 127/47 mmHg (03/06 0435) SpO2:  [95 %-97 %] 95 % (03/06 0435) Weight:  [399 lb 8 oz (181.212 kg)] 399 lb 8 oz (181.212 kg) (03/06 0500)A  Intake/Output from previous day: 03/05 0701 - 03/06 0700 In: 780 [P.O.:480; IV Piggyback:300] Out: 300 [Urine:300] Intake/Output this shift:    Past Medical History  Diagnosis Date  . Hypertension   . Chronic diastolic CHF (congestive heart failure) (HCC)   . CAD (coronary artery disease)     a. s/p PCI to RCA in 2012. b. prior cath in 01/2014 with elevated L/RH pressures, mild-mod CAD of LAD/LCx with patent RCA, normal EF, c/b CIN/CHF.  [name] PAF (paroxysmal atrial fibrillation) (HCC)     TEE DCCV 09/23/2014  . Anemia   . Pilonidal cyst 1980's; 01/25/2013  . Neuropathy (HCC)   . History of blood transfusion ~ 1954    "related to OR"  . Carpal tunnel syndrome, bilateral   . Morbid obesity (HCC)   . Hyperlipidemia   . OSA (obstructive sleep apnea)     "I wear nasal prongs; haven't been using prongs recently" (09/19/2014)  . Type II diabetes mellitus (HCC)   . Arthritis     "hands and lower back" (09/19/2014)  . Chronic lower back pain   . Chronic kidney disease (CKD), stage III (moderate)   . Cellulitis   . Hypoxia     a. Qualified for home O2 at DC in 09/2014.  [name] Physical deconditioning     Physical Exam:  Lungs - Normal respiratory effort, chest expands symmetrically.  Abdomen - Soft, non-tender, markedly obese & non-distended. GU: He has no scrotal erythema or crepitus. No abscess. Mild penile swelling.  Perineum: Drastically improved. No drainage  appreciated  Lab Results:  Recent Labs  09/13/15 0701 09/14/15 0704 09/15/15 0527  WBC 13.4* 13.7* 14.1*  HGB 10.1* 10.0* 9.9*  HCT 32.1* 31.8* 30.8*   BMET  Recent Labs  09/14/15 0704 09/15/15 0527  NA 135 138  K 5.1 4.4  CL 100* 102  CO2 25 26  GLUCOSE 276* 75  BUN 13 10  CREATININE 1.35* 1.45*  CALCIUM 9.0 9.1   No results for input(s): LABURIN in the last 72 hours. Results for orders placed or performed during the hospital encounter of 09/08/15  Blood culture (routine x 2)     Status: None   Collection Time: 09/08/15  6:03 PM  Result Value Ref Range Status   Specimen Description BLOOD RIGHT HAND  Final   Special Requests IN PEDIATRIC BOTTLE 1CC  Final   Culture NO GROWTH 5 DAYS  Final   Report Status 09/13/2015 FINAL  Final  Blood culture (routine x 2)     Status: None   Collection Time: 09/08/15  7:15 PM  Result Value Ref Range Status   Specimen Description BLOOD LEFT ANTECUBITAL  Final   Special Requests IN PEDIATRIC BOTTLE 3CC  Final   Culture NO GROWTH 5 DAYS  Final   Report Status 09/13/2015 FINAL  Final    Studies/Results: No results found.  Assessment: Perineal drainage:  Resolved. No sign of abscess. Cellulitis significantly  improved. No further intervention indicated at this time.  Plan: 1. Ok for discharge home from urology standpoint 2. Follow up in one month with repeat ultrasound of scrotum  Cloyde Reams [facility] 09/15/2015, 8:12 AM

## 2015-09-15 NOTE — Progress Notes (Signed)
PROGRESS NOTE  [name] OHY:[id number] DOB: [birth year] DOA: 09/08/2015 PCP: [name], MD Outpatient Specialists:    LOS: 7 days   Brief Narrative: 65 y.o. male with Multiple Medical Problems including Paroxysmal Atrial Fibrillation on Xarelto Rx, DM2, HTN, Chronic Diastolic CHF who presents to the ED with 4 days of worsening pain and swelling of his Scrotum.  Assessment & Plan: Active Problems:   Diabetes mellitus type 2, uncontrolled, with complications (HCC)   OBESITY-MORBID BMI 54   Essential hypertension, benign   Atrial fibrillation- DCCV 2010   Long term (current) use of anticoagulants   Chronic diastolic congestive heart failure (HCC)   CKD (chronic kidney disease) stage 3, GFR 30-59 ml/min   Sepsis (HCC)   Scrotal wall abscess   AKI (acute kidney injury) (HCC)   Hyperglycemia   Sepsis (HCC) resolved/Scrotal wall abscess improving - continue IV antibiotics with Vancomycin and Zosyn. Blood culture negative to date white count slowly improving 21.9>13.4>14.1, patient continues to complain of pain, requesting a repeat CT scan as recommended by urology yesterday - Urology   ruled out foruneir's gangrene   -First CT scan raised the possibility of neoplasm with diffuse thickening of the posterior scrotal wall and perineum with an exophytic and irregular masslike component, urology evaluated, recommended follow-up with a scrotal ultrasound as an outpatient in approximately one month to ensure resolution of the "chronic inflammation" seen on the CT scan. This was discussed with the patient  Patient seen by Dr Vernie Ammons 3/3, 3/5,3/6. Will obtain CT of the abdomen pelvis to further evaluate the patient's ongoing pain that does not seem to be improving Scrotal ultrasound shows left hydrocele right testicular exophytic cyst, no abscess Anticipate discharge tomorrow   Atrial fibrillation - patient's CHA2DS2-VASc Score for Stroke Risk is 3, continue Diltiazem, Amiodarone,  Metoprolol and Xarelto - telemetry   AKI on CKD III, baseline 1.4 Creatinine slightly improved today, 2.37> 1.45, continue gentle IV fluids in the setting of repeat CT scan, held Aldactone, Lasix,   Chronic diastolic heart failure - Most recent 2-D echo was in May 2016 showed ejection fraction of 50-55% Hold Lasix and spironolactone   Morbid obesity Body mass index is 54.17 kg/(m^2).   DM - Most recent hemoglobin A1c was 10.5 in December - Patient is on U500,  and Levemir, CBG in the 300s, continue Levemir at 15 units now   DVT prophylaxis: on Xarelto Code Status: Full Family Communication: no family bedside  Disposition Plan: Continue IV antibiotics, Possible CT of the pelvis versus exploration , anticipate discharge tomorrow    Consultants:   Urology   Procedures:   None   Antimicrobials:  Vancomycin 2/27 >  Zosyn 2/27 >>   Subjective: Patient reports increased perineal pain today, not comfortable being discharged    Objective: Filed Vitals:   09/14/15 1307 09/14/15 2151 09/15/15 0435 09/15/15 0500  BP: 143/51 136/55 127/47   Pulse: 91 107 101   Temp: 97.7 F (36.5 C) 97.5 F (36.4 C) 98.3 F (36.8 C)   TempSrc:  Oral Oral   Resp: 19 18 18    Height:      Weight:    181.212 kg (399 lb 8 oz)  SpO2: 95% 97% 95%     Intake/Output Summary (Last 24 hours) at 09/15/15 1133 Last data filed at 09/15/15 0900  Gross per 24 hour  Intake    860 ml  Output    300 ml  Net    560 ml   11/15/15  09/08/15 2205 09/14/15 0735 09/15/15 0500  Weight: 167.559 kg (369 lb 6.4 oz) 170.099 kg (375 lb) 181.212 kg (399 lb 8 oz)   Examination: BP 127/47 mmHg  Pulse 101  Temp(Src) 98.3 F (36.8 C) (Oral)  Resp 18  Ht 6' (1.829 m)  Wt 181.212 kg (399 lb 8 oz)  BMI 54.17 kg/m2  SpO2 95% General exam: obese male in NAD Respiratory system: Clear. No increased work of breathing. No wheezing, no crackles. Difficult exam due to  body habitus Cardiovascular system: regular rate and rhythm, no murmurs, gallops. No JVD. No peripheral edema.  Gastrointestinal system: Abdomen is obese, soft and nontender. Normal bowel sounds heard. Central nervous system: AxOx3. No focal deficits GU: mild scrotal swelling and erythema on left, mildly tender to palpation, mucopurulent discharge Extremities: No clubbing/cyanosis Skin: no rashes  Data Reviewed: I have personally reviewed following labs and imaging studies  CBC:  Recent Labs Lab 09/11/15 0553 09/12/15 0711 09/13/15 0701 09/14/15 0704 09/15/15 0527  WBC 16.3* 13.7* 13.4* 13.7* 14.1*  HGB 10.1* 10.2* 10.1* 10.0* 9.9*  HCT 30.0* 32.5* 32.1* 31.8* 30.8*  MCV 78.9 79.1 80.5 80.3 80.4  PLT 322 362 364 375 332   Basic Metabolic Panel:  Recent Labs Lab 09/11/15 0553 09/12/15 0711 09/13/15 0701 09/14/15 0704 09/15/15 0527  NA 131* 134* 137 135 138  K 4.3 4.4 4.7 5.1 4.4  CL 98* 98* 103 100* 102  CO2 22 24 26 25 26   GLUCOSE 51* 122* 63* 276* 75  BUN 34* 31* 19 13 10   CREATININE 2.37* 1.97* 1.50* 1.35* 1.45*  CALCIUM 8.3* 8.6* 8.9 9.0 9.1   CBG:  Recent Labs Lab 09/14/15 0747 09/14/15 1137 09/14/15 1655 09/15/15 0050 09/15/15 0803  GLUCAP 294* 377* 241* 89 113*   Urine analysis:    Component Value Date/Time   COLORURINE YELLOW 09/08/2015 1639   APPEARANCEUR CLEAR 09/08/2015 1639   LABSPEC 1.010 09/08/2015 1639   PHURINE 6.0 09/08/2015 1639   GLUCOSEU 100* 09/08/2015 1639   HGBUR NEGATIVE 09/08/2015 1639   BILIRUBINUR NEGATIVE 09/08/2015 1639   KETONESUR NEGATIVE 09/08/2015 1639   PROTEINUR NEGATIVE 09/08/2015 1639   UROBILINOGEN 1.0 11/27/2014 0755   NITRITE NEGATIVE 09/08/2015 1639   LEUKOCYTESUR NEGATIVE 09/08/2015 1639   Radiology Studies: No results found. Scheduled Meds: . amiodarone  200 mg Oral Daily  . diltiazem  120 mg Oral Daily  . gabapentin  1,200 mg Oral BID  . insulin detemir  15 Units Subcutaneous Daily  . insulin  regular human CONCENTRATED  70 Units Subcutaneous TID WC  . metoprolol tartrate  25 mg Oral BID  . multivitamin with minerals  1 tablet Oral Daily  . piperacillin-tazobactam (ZOSYN)  IV  3.375 g Intravenous Q8H  . rivaroxaban  20 mg Oral Q supper  . rosuvastatin  40 mg Oral Daily  . vancomycin  1,250 mg Intravenous Q12H   Continuous Infusions: . sodium chloride      09/10/2015 MD  [name] If 7PM-7AM, please contact night-coverage [email or web address] Password [facility] 09/15/2015, 11:33 AM

## 2015-09-15 NOTE — Care Management Important Message (Signed)
Important Message  Patient Details  Name: [name] MRN: [id number] Date of Birth: [birth year]   Medicare Important Message Given:  Yes    Kyla Balzarine 09/15/2015, 2:29 PM

## 2015-09-15 NOTE — Progress Notes (Signed)
PT Cancellation Note  Patient Details Name: [name] MRN: [id number] DOB: Apr 15, 1951   Cancelled Treatment:    Reason Eval/Treat Not Completed: PT screened, no needs identified, will sign off. Pt up in recliner upon arrival.  He feels he does not need therapy services.  Nurse reports he has been up in room I[place]ly.  He denies dizziness or feeling unbalanced.  He states he has no trouble with his ADLs as well.  Will sign off. If status changes, please re-order PT.  Thank you for this referral. Clydie Braun L. Katrinka Blazing, [place] Pager [phone] 09/15/2015    Alanya Vukelich LUBECK 09/15/2015, 9:50 AM

## 2015-09-16 ENCOUNTER — Inpatient Hospital Stay (HOSPITAL_COMMUNITY): Payer: Medicare Other

## 2015-09-16 ENCOUNTER — Ambulatory Visit: Payer: Self-pay | Admitting: Pharmacist

## 2015-09-16 DIAGNOSIS — Z5181 Encounter for therapeutic drug level monitoring: Secondary | ICD-10-CM

## 2015-09-16 DIAGNOSIS — I4819 Other persistent atrial fibrillation: Secondary | ICD-10-CM

## 2015-09-16 LAB — COMPREHENSIVE METABOLIC PANEL
ALBUMIN: 2.9 g/dL — AB (ref 3.5–5.0)
ALT: 16 U/L — ABNORMAL LOW (ref 17–63)
ANION GAP: 12 (ref 5–15)
AST: 18 U/L (ref 15–41)
Alkaline Phosphatase: 63 U/L (ref 38–126)
BILIRUBIN TOTAL: 0.6 mg/dL (ref 0.3–1.2)
BUN: 11 mg/dL (ref 6–20)
CO2: 28 mmol/L (ref 22–32)
Calcium: 9.4 mg/dL (ref 8.9–10.3)
Chloride: 99 mmol/L — ABNORMAL LOW (ref 101–111)
Creatinine, Ser: 1.51 mg/dL — ABNORMAL HIGH (ref 0.61–1.24)
GFR, EST AFRICAN AMERICAN: 55 mL/min — AB (ref >60)
GFR, EST NON AFRICAN AMERICAN: 47 mL/min — AB (ref >60)
Glucose, Bld: 75 mg/dL (ref 65–99)
POTASSIUM: 4.5 mmol/L (ref 3.5–5.1)
Sodium: 139 mmol/L (ref 135–145)
TOTAL PROTEIN: 6.7 g/dL (ref 6.5–8.1)

## 2015-09-16 LAB — VANCOMYCIN, TROUGH: VANCOMYCIN TR: 23 ug/mL — AB (ref 10.0–20.0)

## 2015-09-16 LAB — GLUCOSE, CAPILLARY
GLUCOSE-CAPILLARY: 132 mg/dL — AB (ref 65–99)
GLUCOSE-CAPILLARY: 311 mg/dL — AB (ref 65–99)
GLUCOSE-CAPILLARY: 289 mg/dL — AB (ref 65–99)
GLUCOSE-CAPILLARY: 190 mg/dL — AB (ref 65–99)

## 2015-09-16 LAB — CBC
HEMATOCRIT: 31 % — AB (ref 39.0–52.0)
Hemoglobin: 9.7 g/dL — ABNORMAL LOW (ref 13.0–17.0)
MCH: 25.1 pg — AB (ref 26.0–34.0)
MCHC: 31.3 g/dL (ref 30.0–36.0)
MCV: 80.3 fL (ref 78.0–100.0)
Platelets: 374 10*3/uL (ref 150–400)
RBC: 3.86 MIL/uL — ABNORMAL LOW (ref 4.22–5.81)
RDW: 15.1 % (ref 11.5–15.5)
WBC: 12.8 10*3/uL — ABNORMAL HIGH (ref 4.0–10.5)

## 2015-09-16 MED ORDER — FUROSEMIDE 10 MG/ML IJ SOLN
40.0000 mg | Freq: Once | INTRAMUSCULAR | Status: AC
  Administered 2015-09-16: 40 mg via INTRAVENOUS
  Filled 2015-09-16: qty 4

## 2015-09-16 MED ORDER — FUROSEMIDE 80 MG PO TABS: 40.0000 mg | ORAL_TABLET | Freq: Two times a day (BID) | ORAL | Status: DC

## 2015-09-16 MED ORDER — FUROSEMIDE 10 MG/ML IJ SOLN
60.0000 mg | Freq: Once | INTRAMUSCULAR | Status: AC
  Administered 2015-09-16: 60 mg via INTRAVENOUS
  Filled 2015-09-16: qty 6

## 2015-09-16 NOTE — Progress Notes (Signed)
Patient came back from u/s paged Dr Susie Cassette for new diet orders and asked about the patient getting lasix because he feels that he is fluid overloaded at this time.

## 2015-09-16 NOTE — Discharge Instructions (Signed)

## 2015-09-16 NOTE — Discharge Summary (Signed)
Physician Discharge Summary  [name] MRN: [id number] DOB/AGE: [birth year] 65 y.o.  PCP: Lujean Amel, MD   Admit date: 09/08/2015 Discharge date: 09/16/2015  Discharge Diagnoses:     Active Problems:   Diabetes mellitus type 2, uncontrolled, with complications (HCC)   OBESITY-MORBID BMI 54   Essential hypertension, benign   Atrial fibrillation- DCCV 2010   Long term (current) use of anticoagulants   Chronic diastolic congestive heart failure (HCC)   CKD (chronic kidney disease) stage 3, GFR 30-59 ml/min   Sepsis ([place])   Scrotal wall abscess   AKI (acute kidney injury) ([place])   Hyperglycemia    Follow-up recommendations Follow-up with PCP in 3-5 days , including all  additional recommended appointments as below Follow-up CBC, CMP in 3-5 days Patient to follow-up with urology in 1-2 weeks     Medication List    STOP taking these medications        spironolactone 25 MG tablet  Commonly known as:  ALDACTONE      TAKE these medications        amiodarone 200 MG tablet  Commonly known as:  PACERONE  Take 200 mg by mouth daily.     ciprofloxacin 500 MG tablet  Commonly known as:  CIPRO  Take 1 tablet (500 mg total) by mouth 2 (two) times daily.     diltiazem 120 MG 24 hr capsule  Commonly known as:  CARDIZEM CD  Take 1 capsule (120 mg total) by mouth daily.     doxycycline 100 MG capsule  Commonly known as:  MONODOX  Take 1 capsule (100 mg total) by mouth 2 (two) times daily.     furosemide 80 MG tablet  Commonly known as:  LASIX  Take 0.5 tablets (40 mg total) by mouth 2 (two) times daily.  Start taking on:  09/22/2015     gabapentin 300 MG capsule  Commonly known as:  NEURONTIN  Take 2 capsules (600 mg total) by mouth 2 (two) times daily.     Insulin Detemir 100 UNIT/ML Pen  Commonly known as:  LEVEMIR FLEXTOUCH  Inject 20 Units into the skin daily.     Insulin Pen Needle 31G X 8 MM Misc  Use to inject insulin daily as instructed     insulin  regular human CONCENTRATED 500 UNIT/ML injection  Commonly known as:  HUMULIN R  Inject 0.16 mLs (80 Units total) into the skin 3 (three) times daily before meals.     lisinopril 10 MG tablet  Commonly known as:  PRINIVIL,ZESTRIL  Take 1 tablet (10 mg total) by mouth daily.  Start taking on:  09/29/2015     methocarbamol 500 MG tablet  Commonly known as:  ROBAXIN  Take 1 tablet (500 mg total) by mouth every 8 (eight) hours as needed for muscle spasms.     metoprolol tartrate 25 MG tablet  Commonly known as:  LOPRESSOR  Take 1 tablet (25 mg total) by mouth 2 (two) times daily.     multivitamin with minerals Tabs tablet  Take 1 tablet by mouth daily.     NATURAL BALANCE TEARS OP  Place 1 drop into both eyes daily as needed (for dry eyes).     Oxycodone HCl 10 MG Tabs  Take 1 tablet (10 mg total) by mouth every 6 (six) hours as needed for severe pain.     potassium chloride SA 20 MEQ tablet  Commonly known as:  KLOR-CON M20  Take 1 tablet (20 mEq total)  by mouth 2 (two) times daily.     rosuvastatin 40 MG tablet  Commonly known as:  CRESTOR  Take 40 mg by mouth daily.     XARELTO 20 MG Tabs tablet  Generic drug:  rivaroxaban  TAKE 1 TABLET (20 MG TOTAL) BY MOUTH DAILY WITH SUPPER.         Discharge Condition: Stable   Discharge Instructions       Discharge Instructions    Diet - low sodium heart healthy    Complete by:  As directed      Increase activity slowly    Complete by:  As directed            No Known Allergies    Disposition: 01-Home or Self Care   Consults:  Urology     Significant Diagnostic Studies:  Ct Pelvis W Contrast  09/08/2015  CLINICAL DATA:  65 year old male with scrotal swelling and pain. Patient has been unable to urinate last night. EXAM: CT PELVIS WITH CONTRAST TECHNIQUE: Multidetector CT imaging of the pelvis was performed using the standard protocol following the bolus administration of intravenous contrast. CONTRAST:   64m OMNIPAQUE IOHEXOL 300 MG/ML  SOLN COMPARISON:  CT dated 07/12/2015 FINDINGS: Evaluation of this exam is limited in the absence of intravenous contrast. There is a small left hydrocele. A 3 mm focus of extratesticular calcification noted in left scrotum. Ultrasound may provide better evaluation of the scrotum and testicles. There is diffuse thickening of the posterior scrotal wall. An irregular masslike tissue is noted extending from the posterior scrotal wall to the posterior perineal subcutaneous fat. This mass has an exophytic component with irregular surface measuring approximately 5.5 x 11.3 cm. No discrete drainable fluid collection/abscess identified. Although the soft tissue thickening may be sequela of chronic infection/inflammation, the exophytic and irregular component of the tissue is concerning a neoplastic process. Clinical correlation is recommended. The visualized ureters, the urinary bladder, prostate, and seminal vesicles appear grossly unremarkable. No bowel dilatation identified. There is aortoiliac atherosclerotic disease. No lymphadenopathy noted within the pelvis. Mild degenerative changes of lower lumbar spine.  No acute fracture. IMPRESSION: Diffuse thickening of the posterior scrotal wall and perineum with an exophytic and irregular masslike component concerning for underlying neoplasm. Clinical correlation is recommended. No drainable fluid collection identified. Electronically Signed   By: AAnner CreteM.D.   On: 09/08/2015 19:13   [place]oreaScrotum  09/12/2015  CLINICAL DATA:  Left-sided scrotal pain. EXAM: ULTRASOUND OF SCROTUM TECHNIQUE: Complete ultrasound examination of the testicles, epididymis, and other scrotal structures was performed. COMPARISON:  None. FINDINGS: Right testicle Measurements: 3.1 x 2.9 x 2.0 cm. Doppler flow is noted. 4 mm partially exophytic cyst is noted. No mass or microlithiasis visualized. Left testicle Measurements: 2.7 x 2.3 x 2.0 cm. Doppler flow is  noted. No mass or microlithiasis visualized. Right epididymis:  Normal in size and appearance. Left epididymis:  Normal in size and appearance. Hydrocele:  Small left hydrocele is noted. Varicocele:  None visualized. IMPRESSION: Small left hydrocele. Small right testicular exophytic cyst. No other abnormality seen in the scrotum. Electronically Signed   By: JMarijo Conception M.D.   On: 09/12/2015 14:42   Ct Abdomen Pelvis W Contrast  09/15/2015  CLINICAL DATA:  65year old male complaining of pain in the groin. EXAM: CT ABDOMEN AND PELVIS WITH CONTRAST TECHNIQUE: Multidetector CT imaging of the abdomen and pelvis was performed using the standard protocol following bolus administration of intravenous contrast. CONTRAST:  1073mOMNIPAQUE IOHEXOL 300  MG/ML  SOLN COMPARISON:  CT the abdomen and pelvis 07/12/2015. FINDINGS: Lower chest: Atherosclerotic calcifications left main, left anterior descending, left circumflex and right coronary arteries. Enlarged low right paraesophageal lymph node measuring up to 17 mm in short axis. Small bilateral pleural effusions (right greater than left). Hepatobiliary: Mild diffuse decreased attenuation throughout the hepatic parenchyma, compatible with a background of mild hepatic steatosis. Gallbladder is nearly decompressed, but the gallbladder wall appears thickened or edematous. No definite gallstones are identified. Pancreas: No definite pancreatic mass or peripancreatic inflammatory changes on today's noncontrast CT examination. Spleen: Unremarkable. Adrenals/Urinary Tract: 2.6 x 3.2 cm low-attenuation (7 HU) left adrenal nodule, compatible with an adenoma. Unenhanced appearance of right adrenal gland and bilateral kidneys is otherwise unremarkable. Small amount of perinephric stranding bilaterally (nonspecific and unchanged). No hydroureteronephrosis. Urinary bladder is normal in appearance. Stomach/Bowel: Unenhanced appearance of the stomach is normal. No pathologic dilatation  of small bowel or colon. Appendix is not confidently identified, likely surgically absent. Regardless, there are no inflammatory changes noted adjacent to the cecum to suggest presence of an acute appendicitis at this time. Vascular/Lymphatic: Extensive atherosclerosis throughout the abdominal and pelvic vasculature, without definite aneurysm. There are multiple borderline enlarged and mildly enlarged lymph nodes throughout the abdomen and pelvis, largest of which include a 1.9 cm short axis portacaval lymph node, and a 12 mm short axis left superficial femoral lymph node. Reproductive: Prostate gland and seminal vesicles are unremarkable in appearance. Other: No inguinal hernias. No significant volume of ascites. No pneumoperitoneum. Musculoskeletal: There are no aggressive appearing lytic or blastic lesions noted in the visualized portions of the skeleton. IMPRESSION: 1. No definite acute findings to account for the patient's symptoms. Specifically, no inguinal hernia identified. 2. There are multiple borderline enlarged and minimally enlarged lymph nodes in the abdomen, pelvis and lower middle mediastinum, as discussed above. These are nonspecific, but correlation for signs and symptoms of lymphoproliferative disorder is suggested. 3. Apparent thickening and edema of the wall the gallbladder. The gallbladder is relatively decompressed on today's examination, and there are no gallstones identified. This makes the possibility of cholecystitis highly unlikely, but clinical correlation is recommended. If there is any clinical concern for gallbladder pathology, further evaluation with right upper quadrant abdominal ultrasound could be considered. 4. 2.6 x 3.2 cm low-attenuation left adrenal nodule, similar to the prior study, compatible with an adrenal adenoma. 5. Additional incidental findings, as above. Electronically Signed   By: Vinnie Langton M.D.   On: 09/15/2015 20:19   US Abdomen Limited Ruq  09/16/2015   CLINICAL DATA:  Right upper quadrant pain EXAM: US ABDOMEN LIMITED - RIGHT UPPER QUADRANT COMPARISON:  CT scan 09/15/2015 FINDINGS: Gallbladder: No gallstones are noted within gallbladder. Again noted thickening of gallbladder wall up to 6 mm. There is no sonographic Murphy's sign. Common bile duct: Diameter: 5.4 mm in diameter within normal limits. Liver: No focal hepatic mass. There is diffuse increased echogenicity of the liver. IMPRESSION: No gallstones are noted within gallbladder. There is thickening of gallbladder wall up to 6 mm. Although there is no sonographic Murphy's sign clinical correlation is necessary to exclude subacute cholecystitis. Normal CBD. There is diffuse increased echogenicity of the liver consistent with fatty infiltration. Electronically Signed   By: Lahoma Crocker M.D.   On: 09/16/2015 09:53        Filed Weights   09/14/15 0735 09/15/15 0500 09/16/15 0548  Weight: 170.099 kg (375 lb) 181.212 kg (399 lb 8 oz) 182.845 kg (403 lb 1.6  oz)     Microbiology: Recent Results (from the past 240 hour(s))  Blood culture (routine x 2)     Status: None   Collection Time: 09/08/15  6:03 PM  Result Value Ref Range Status   Specimen Description BLOOD RIGHT HAND  Final   Special Requests IN PEDIATRIC BOTTLE [place]  Final   Culture NO GROWTH 5 DAYS  Final   Report Status 09/13/2015 FINAL  Final  Blood culture (routine x 2)     Status: None   Collection Time: 09/08/15  7:15 PM  Result Value Ref Range Status   Specimen Description BLOOD LEFT ANTECUBITAL  Final   Special Requests IN PEDIATRIC BOTTLE 3CC  Final   Culture NO GROWTH 5 DAYS  Final   Report Status 09/13/2015 FINAL  Final       Blood Culture    Component Value Date/Time   SDES BLOOD LEFT ANTECUBITAL 09/08/2015 1915   SPECREQUEST IN PEDIATRIC BOTTLE 3CC 09/08/2015 1915   CULT NO GROWTH 5 DAYS 09/08/2015 1915   REPTSTATUS 09/13/2015 FINAL 09/08/2015 1915      Labs: Results for orders placed or performed during  the hospital encounter of 09/08/15 (from the past 48 hour(s))  Glucose, capillary     Status: Abnormal   Collection Time: 09/14/15  4:55 PM  Result Value Ref Range   Glucose-Capillary 241 (H) 65 - 99 mg/dL  Glucose, capillary     Status: None   Collection Time: 09/15/15 12:50 AM  Result Value Ref Range   Glucose-Capillary 89 65 - 99 mg/dL  Comprehensive metabolic panel     Status: Abnormal   Collection Time: 09/15/15  5:27 AM  Result Value Ref Range   Sodium 138 135 - 145 mmol/L   Potassium 4.4 3.5 - 5.1 mmol/L   Chloride 102 101 - 111 mmol/L   CO2 26 22 - 32 mmol/L   Glucose, Bld 75 65 - 99 mg/dL   BUN 10 6 - 20 mg/dL   Creatinine, Ser 1.45 (H) 0.61 - 1.24 mg/dL   Calcium 9.1 8.9 - 10.3 mg/dL   Total Protein 6.6 6.5 - 8.1 g/dL   Albumin 2.9 (L) 3.5 - 5.0 g/dL   AST 16 15 - 41 U/L   ALT 18 17 - 63 U/L   Alkaline Phosphatase 59 38 - 126 U/L   Total Bilirubin 0.6 0.3 - 1.2 mg/dL   GFR calc non Af Amer 50 (L) >60 mL/min   GFR calc Af Amer 58 (L) >60 mL/min    Comment: (NOTE) The eGFR has been calculated using the CKD EPI equation. This calculation has not been validated in all clinical situations. eGFR's persistently <60 mL/min signify possible Chronic Kidney Disease.    Anion gap 10 5 - 15  CBC     Status: Abnormal   Collection Time: 09/15/15  5:27 AM  Result Value Ref Range   WBC 14.1 (H) 4.0 - 10.5 K/uL   RBC 3.83 (L) 4.22 - 5.81 MIL/uL   Hemoglobin 9.9 (L) 13.0 - 17.0 g/dL   HCT 30.8 (L) 39.0 - 52.0 %   MCV 80.4 78.0 - 100.0 fL   MCH 25.8 (L) 26.0 - 34.0 pg   MCHC 32.1 30.0 - 36.0 g/dL   RDW 15.2 11.5 - 15.5 %   Platelets 332 150 - 400 K/uL  Glucose, capillary     Status: Abnormal   Collection Time: 09/15/15  8:03 AM  Result Value Ref Range   Glucose-Capillary 113 (H) 65 -  99 mg/dL  Glucose, capillary     Status: Abnormal   Collection Time: 09/15/15 11:59 AM  Result Value Ref Range   Glucose-Capillary 276 (H) 65 - 99 mg/dL  Glucose, capillary     Status:  Abnormal   Collection Time: 09/15/15  5:15 PM  Result Value Ref Range   Glucose-Capillary 270 (H) 65 - 99 mg/dL  Glucose, capillary     Status: Abnormal   Collection Time: 09/15/15  8:06 PM  Result Value Ref Range   Glucose-Capillary 219 (H) 65 - 99 mg/dL  CBC     Status: Abnormal   Collection Time: 09/16/15  2:19 AM  Result Value Ref Range   WBC 12.8 (H) 4.0 - 10.5 K/uL   RBC 3.86 (L) 4.22 - 5.81 MIL/uL   Hemoglobin 9.7 (L) 13.0 - 17.0 g/dL   HCT 31.0 (L) 39.0 - 52.0 %   MCV 80.3 78.0 - 100.0 fL   MCH 25.1 (L) 26.0 - 34.0 pg   MCHC 31.3 30.0 - 36.0 g/dL   RDW 15.1 11.5 - 15.5 %   Platelets 374 150 - 400 K/uL  Comprehensive metabolic panel     Status: Abnormal   Collection Time: 09/16/15  2:19 AM  Result Value Ref Range   Sodium 139 135 - 145 mmol/L   Potassium 4.5 3.5 - 5.1 mmol/L   Chloride 99 (L) 101 - 111 mmol/L   CO2 28 22 - 32 mmol/L   Glucose, Bld 75 65 - 99 mg/dL   BUN 11 6 - 20 mg/dL   Creatinine, Ser 1.51 (H) 0.61 - 1.24 mg/dL   Calcium 9.4 8.9 - 10.3 mg/dL   Total Protein 6.7 6.5 - 8.1 g/dL   Albumin 2.9 (L) 3.5 - 5.0 g/dL   AST 18 15 - 41 U/L   ALT 16 (L) 17 - 63 U/L   Alkaline Phosphatase 63 38 - 126 U/L   Total Bilirubin 0.6 0.3 - 1.2 mg/dL   GFR calc non Af Amer 47 (L) >60 mL/min   GFR calc Af Amer 55 (L) >60 mL/min    Comment: (NOTE) The eGFR has been calculated using the CKD EPI equation. This calculation has not been validated in all clinical situations. eGFR's persistently <60 mL/min signify possible Chronic Kidney Disease.    Anion gap 12 5 - 15  Vancomycin, trough     Status: Abnormal   Collection Time: 09/16/15  2:20 AM  Result Value Ref Range   Vancomycin Tr 23 (H) 10.0 - 20.0 ug/mL  Glucose, capillary     Status: Abnormal   Collection Time: 09/16/15  8:01 AM  Result Value Ref Range   Glucose-Capillary 132 (H) 65 - 99 mg/dL     Lipid Panel     Lab Results  Component Value Date   HGBA1C 10.5* 07/09/2015   HGBA1C 9.6* 02/13/2014    HGBA1C 10.1* 09/08/2012     Lab Results  Component Value Date   MICROALBUR 6.24* 04/07/2009   LDLCALC UNABLE TO CALCULATE IF TRIGLYCERIDE OVER 400 mg/dL 07/09/2015   CREATININE 1.51* 09/16/2015     HPI :*65 y.o. male with Multiple Medical Problems including Paroxysmal Atrial Fibrillation on Xarelto Rx, DM2, HTN, Chronic Diastolic CHF who presents to the ED with 4 days of worsening pain and swelling of his Scrotum. He denies fevers or chills. He was seen in the ED, and a Sepsis workup was initiated a CT scan was performed which revealed diffuse thickening of the left scrotal wall and  perineum concerning for Inflammation or an underlying neoplastic process. Urology was consulted and saw patient in the ED. The area spontaneously began to drain purulent fluid. He was placed on IV Vancomycin and Zosyn and referred for admission  HOSPITAL COURSE:   Sepsis ([place]) resolved/Scrotal wall abscess improving Treated with  IV antibiotics with Vancomycin and Zosyn. Blood culture negative to date white count slowly improving 21.9>13.4>14.1>12.8 ,  Repeat CT scan on 3/6 did not show any obvious infection, abscess, - Urology ruled out foruneir's gangrene and recommended outpatient follow-up -First CT scan raised the possibility of neoplasm with diffuse thickening of the posterior scrotal wall and perineum with an exophytic and irregular masslike component, urology evaluated, recommended follow-up with a scrotal ultrasound as an outpatient in approximately one month to ensure resolution of the "chronic inflammation" seen on the CT scan. This was discussed with the patient  Patient evaluated by Dr Karsten Ro 3/3, 3/5,3/6.  Scrotal ultrasound shows left hydrocele right testicular exophytic cyst, no abscess Patient will be discharged on Cipro and doxycycline  Suspected cholecystitis Edema of the gallbladder wall, right upper quadrant ultrasound No gallstones are noted within gallbladder. There is  thickening of gallbladder wall up to 6 mm. Although there is no sonographic Murphy's sign , doubt subacute cholecystitis. Normal CBD. There is diffuse increased echogenicity of the liver consistent with fatty infiltration   Atrial fibrillation - patient's CHA2DS2-VASc Score for Stroke Risk is 3, continue Diltiazem, Amiodarone, Metoprolol and Xarelto - telemetry   AKI on CKD III, baseline 1.4 Creatinine slightly improved today, 2.37> 1.45>1.51 Lasix held during this admission Restarted low-dose Lasix prior to discharge given increased dependent edema,,  Continue to hold Aldactone  Chronic diastolic heart failure - Most recent 2-D echo was in May 2016 showed ejection fraction of 50-55% Hold Lasix and spironolactone   Morbid obesity Body mass index is 54.17 kg/(m^2).   DM - Most recent hemoglobin A1c was 10.5 in December - Patient is on U500, and Levemir, continue    Discharge Exam:    Blood pressure 124/68, pulse 88, temperature 98.3 F (36.8 C), temperature source Oral, resp. rate 20, height 6' (1.829 m), weight 182.845 kg (403 lb 1.6 oz), SpO2 90 %.  General exam: obese male in NAD Respiratory system: Clear. No increased work of breathing. No wheezing, no crackles. Difficult exam due to body habitus Cardiovascular system: regular rate and rhythm, no murmurs, gallops. No JVD. No peripheral edema.  Gastrointestinal system: Abdomen is obese, soft and nontender. Normal bowel sounds heard. Central nervous system: AxOx3. No focal deficits GU: mild scrotal swelling and erythema on left, mildly tender to palpation, mucopurulent discharge Extremities: No clubbing/cyanosis Skin: no rashes    Follow-up Information    Follow up with Nickie Retort, MD In 1 month.   Specialty:  Urology   Contact information:   [address] [place] [address] [phone]       Schedule an appointment as soon as possible for a visit with Veteran.    Why:  Please bring your photo ID and insurance cards. Arrive 10 minutes prior to appointment.    Contact information:   [place] [address] [phone]      Follow up with Lujean Amel, MD. Schedule an appointment as soon as possible for a visit in 3 days.   Specialty:  Family Medicine   Why:  Post hospital follow-up   Contact information:   [address]  [place] [address] [phone]       Signed: [name] 09/16/2015, 12:14 PM        Time spent >45 mins

## 2015-09-16 NOTE — Progress Notes (Signed)
Patient unhappy with pain management care.  Patient received dilaudid iv this morning went for a abdominal ultrasound and was given 25mg  oxycodone when he returned at 1025.  Patient then slept and did not call for any pain medication until about 1345.  Patient was given 25mg  of oxycodone.  Encouraged patient to start with PO pain medications because he was close to discharging and would not be able to receive IV pain medications  Before discharge due to it being a safety concern.  Patient verbalized understand and agreement.  Also explained to patient that Iv pain medication could be used for break through pain if the oxycodone was not helping.  Patient spoke with director of unit to state that "his pain has not been controlled today or addressed".  Director looked at [facility] to see that patient had receive pain medication and also explained to him about iv pain medication and discharge.  Patient's pain was readdressed at 1645 to be still at an 8/10. RN asked patient if he would like the dilaudid pain medication and patient agreed to it.  Patient and RN came to agreement that oxycodone would be tried first and then dilaudid would be given if pain is still uncontrolled.

## 2015-09-16 NOTE — Progress Notes (Signed)
Pharmacy Antibiotic Note  [name] is a 65 y.o. male admitted on 09/08/2015 with sepsis/cellulitis, pharmacy dosing  Trough level tonight 23; however, timing of the doses has been inconsistent and so actual trough likely falls within goal range  Plan: Continue vancomycin 1250 mg IV q12h  Height: 6' (182.9 cm) Weight: (!) 399 lb 8 oz (181.212 kg) IBW/kg (Calculated) : 77.6  Temp (24hrs), Avg:98.1 F (36.7 C), Min:97.7 F (36.5 C), Max:98.3 F (36.8 C)   Recent Labs Lab 09/12/15 0711 09/13/15 0701 09/14/15 0704 09/15/15 0527 09/16/15 0219 09/16/15 0220  WBC 13.7* 13.4* 13.7* 14.1* 12.8*  --   CREATININE 1.97* 1.50* 1.35* 1.45* 1.51*  --   VANCOTROUGH  --   --   --   --   --  23*    Estimated Creatinine Clearance: 84.3 mL/min (by C-G formula based on Cr of 1.51).    No Known Allergies  Antimicrobials this admission: Zosyn 2/27 >>  Vancomycin 2/27 >>  Dose adjustments this admission: n/a  Microbiology results: 2/27 BCx: NGTD  Eddie Candle 09/16/2015 3:41 AM

## 2015-09-16 NOTE — Progress Notes (Signed)
New order for iv lasix 60mg .  40mg  of lasix was given at 1300.  New order for 60mg  iv lasix.  Paged md to clarify order.  MD placed care order instruction to give the 60mg  lasix iv stat.

## 2015-09-17 ENCOUNTER — Inpatient Hospital Stay: Payer: Medicare Other | Admitting: Family Medicine

## 2015-09-17 LAB — COMPREHENSIVE METABOLIC PANEL
ALT: 16 U/L — ABNORMAL LOW (ref 17–63)
ANION GAP: 11 (ref 5–15)
AST: 16 U/L (ref 15–41)
Albumin: 2.7 g/dL — ABNORMAL LOW (ref 3.5–5.0)
Alkaline Phosphatase: 58 U/L (ref 38–126)
BUN: 13 mg/dL (ref 6–20)
CALCIUM: 8.7 mg/dL — AB (ref 8.9–10.3)
CHLORIDE: 99 mmol/L — AB (ref 101–111)
CO2: 29 mmol/L (ref 22–32)
Creatinine, Ser: 1.58 mg/dL — ABNORMAL HIGH (ref 0.61–1.24)
GFR, EST AFRICAN AMERICAN: 52 mL/min — AB (ref >60)
GFR, EST NON AFRICAN AMERICAN: 45 mL/min — AB (ref >60)
Glucose, Bld: 123 mg/dL — ABNORMAL HIGH (ref 65–99)
Potassium: 3.7 mmol/L (ref 3.5–5.1)
SODIUM: 139 mmol/L (ref 135–145)
Total Bilirubin: 0.5 mg/dL (ref 0.3–1.2)
Total Protein: 6.5 g/dL (ref 6.5–8.1)

## 2015-09-17 LAB — GLUCOSE, CAPILLARY
GLUCOSE-CAPILLARY: 149 mg/dL — AB (ref 65–99)
GLUCOSE-CAPILLARY: 174 mg/dL — AB (ref 65–99)
GLUCOSE-CAPILLARY: 156 mg/dL — AB (ref 65–99)

## 2015-09-17 MED ORDER — FUROSEMIDE 80 MG PO TABS: 80.0000 mg | ORAL_TABLET | Freq: Two times a day (BID) | ORAL | Status: DC

## 2015-09-17 MED ORDER — FUROSEMIDE 10 MG/ML IJ SOLN
60.0000 mg | Freq: Once | INTRAMUSCULAR | Status: AC
  Administered 2015-09-17: 60 mg via INTRAVENOUS
  Filled 2015-09-17: qty 6

## 2015-09-17 NOTE — Discharge Summary (Signed)
Physician Discharge Summary  [name] MRN: [id number] DOB/AGE: [birth year] 65 y.o.  PCP: Lujean Amel, MD   Admit date: 09/08/2015 Discharge date: 09/17/2015  Discharge Diagnoses:     Active Problems:   Diabetes mellitus type 2, uncontrolled, with complications (HCC)   OBESITY-MORBID BMI 54   Essential hypertension, benign   Atrial fibrillation- DCCV 2010   Long term (current) use of anticoagulants   Chronic diastolic congestive heart failure (HCC)   CKD (chronic kidney disease) stage 3, GFR 30-59 ml/min   Sepsis ([place])   Scrotal wall abscess   AKI (acute kidney injury) ([place])   Hyperglycemia    Follow-up recommendations Follow-up with PCP in 3-5 days , including all  additional recommended appointments as below Follow-up CBC, CMP in 3-5 days Patient to follow-up with urology in 1-2 weeks     Medication List    STOP taking these medications        spironolactone 25 MG tablet  Commonly known as:  ALDACTONE      TAKE these medications        amiodarone 200 MG tablet  Commonly known as:  PACERONE  Take 200 mg by mouth daily.     ciprofloxacin 500 MG tablet  Commonly known as:  CIPRO  Take 1 tablet (500 mg total) by mouth 2 (two) times daily.     diltiazem 120 MG 24 hr capsule  Commonly known as:  CARDIZEM CD  Take 1 capsule (120 mg total) by mouth daily.     doxycycline 100 MG capsule  Commonly known as:  MONODOX  Take 1 capsule (100 mg total) by mouth 2 (two) times daily.     furosemide 80 MG tablet  Commonly known as:  LASIX  Take 1 tablet (80 mg total) by mouth 2 (two) times daily.     gabapentin 300 MG capsule  Commonly known as:  NEURONTIN  Take 2 capsules (600 mg total) by mouth 2 (two) times daily.     Insulin Detemir 100 UNIT/ML Pen  Commonly known as:  LEVEMIR FLEXTOUCH  Inject 20 Units into the skin daily.     Insulin Pen Needle 31G X 8 MM Misc  Use to inject insulin daily as instructed     insulin regular human CONCENTRATED 500  UNIT/ML injection  Commonly known as:  HUMULIN R  Inject 0.16 mLs (80 Units total) into the skin 3 (three) times daily before meals.     lisinopril 10 MG tablet  Commonly known as:  PRINIVIL,ZESTRIL  Take 1 tablet (10 mg total) by mouth daily.  Start taking on:  09/29/2015     methocarbamol 500 MG tablet  Commonly known as:  ROBAXIN  Take 1 tablet (500 mg total) by mouth every 8 (eight) hours as needed for muscle spasms.     metoprolol tartrate 25 MG tablet  Commonly known as:  LOPRESSOR  Take 1 tablet (25 mg total) by mouth 2 (two) times daily.     multivitamin with minerals Tabs tablet  Take 1 tablet by mouth daily.     NATURAL BALANCE TEARS OP  Place 1 drop into both eyes daily as needed (for dry eyes).     Oxycodone HCl 10 MG Tabs  Take 1 tablet (10 mg total) by mouth every 6 (six) hours as needed for severe pain.     potassium chloride SA 20 MEQ tablet  Commonly known as:  KLOR-CON M20  Take 1 tablet (20 mEq total) by mouth 2 (two) times daily.  rosuvastatin 40 MG tablet  Commonly known as:  CRESTOR  Take 40 mg by mouth daily.     XARELTO 20 MG Tabs tablet  Generic drug:  rivaroxaban  TAKE 1 TABLET (20 MG TOTAL) BY MOUTH DAILY WITH SUPPER.         Discharge Condition: Stable   Discharge Instructions   Discharge Instructions    Diet - low sodium heart healthy    Complete by:  As directed      Diet - low sodium heart healthy    Complete by:  As directed      Increase activity slowly    Complete by:  As directed      Increase activity slowly    Complete by:  As directed            No Known Allergies    Disposition: 01-Home or Self Care   Consults:  Urology     Significant Diagnostic Studies:  Ct Pelvis W Contrast  09/08/2015  CLINICAL DATA:  65 year old male with scrotal swelling and pain. Patient has been unable to urinate last night. EXAM: CT PELVIS WITH CONTRAST TECHNIQUE: Multidetector CT imaging of the pelvis was performed using  the standard protocol following the bolus administration of intravenous contrast. CONTRAST:  64m OMNIPAQUE IOHEXOL 300 MG/ML  SOLN COMPARISON:  CT dated 07/12/2015 FINDINGS: Evaluation of this exam is limited in the absence of intravenous contrast. There is a small left hydrocele. A 3 mm focus of extratesticular calcification noted in left scrotum. Ultrasound may provide better evaluation of the scrotum and testicles. There is diffuse thickening of the posterior scrotal wall. An irregular masslike tissue is noted extending from the posterior scrotal wall to the posterior perineal subcutaneous fat. This mass has an exophytic component with irregular surface measuring approximately 5.5 x 11.3 cm. No discrete drainable fluid collection/abscess identified. Although the soft tissue thickening may be sequela of chronic infection/inflammation, the exophytic and irregular component of the tissue is concerning a neoplastic process. Clinical correlation is recommended. The visualized ureters, the urinary bladder, prostate, and seminal vesicles appear grossly unremarkable. No bowel dilatation identified. There is aortoiliac atherosclerotic disease. No lymphadenopathy noted within the pelvis. Mild degenerative changes of lower lumbar spine.  No acute fracture. IMPRESSION: Diffuse thickening of the posterior scrotal wall and perineum with an exophytic and irregular masslike component concerning for underlying neoplasm. Clinical correlation is recommended. No drainable fluid collection identified. Electronically Signed   By: AAnner CreteM.D.   On: 09/08/2015 19:13   [place]rotum  09/12/2015  CLINICAL DATA:  Left-sided scrotal pain. EXAM: ULTRASOUND OF SCROTUM TECHNIQUE: Complete ultrasound examination of the testicles, epididymis, and other scrotal structures was performed. COMPARISON:  None. FINDINGS: Right testicle Measurements: 3.1 x 2.9 x 2.0 cm. Doppler flow is noted. 4 mm partially exophytic cyst is noted. No mass or  microlithiasis visualized. Left testicle Measurements: 2.7 x 2.3 x 2.0 cm. Doppler flow is noted. No mass or microlithiasis visualized. Right epididymis:  Normal in size and appearance. Left epididymis:  Normal in size and appearance. Hydrocele:  Small left hydrocele is noted. Varicocele:  None visualized. IMPRESSION: Small left hydrocele. Small right testicular exophytic cyst. No other abnormality seen in the scrotum. Electronically Signed   By: JMarijo Conception M.D.   On: 09/12/2015 14:42   Ct Abdomen Pelvis W Contrast  09/15/2015  CLINICAL DATA:  65year old male complaining of pain in the groin. EXAM: CT ABDOMEN AND PELVIS WITH CONTRAST TECHNIQUE: Multidetector CT imaging of the  abdomen and pelvis was performed using the standard protocol following bolus administration of intravenous contrast. CONTRAST:  110m OMNIPAQUE IOHEXOL 300 MG/ML  SOLN COMPARISON:  CT the abdomen and pelvis 07/12/2015. FINDINGS: Lower chest: Atherosclerotic calcifications left main, left anterior descending, left circumflex and right coronary arteries. Enlarged low right paraesophageal lymph node measuring up to 17 mm in short axis. Small bilateral pleural effusions (right greater than left). Hepatobiliary: Mild diffuse decreased attenuation throughout the hepatic parenchyma, compatible with a background of mild hepatic steatosis. Gallbladder is nearly decompressed, but the gallbladder wall appears thickened or edematous. No definite gallstones are identified. Pancreas: No definite pancreatic mass or peripancreatic inflammatory changes on today's noncontrast CT examination. Spleen: Unremarkable. Adrenals/Urinary Tract: 2.6 x 3.2 cm low-attenuation (7 HU) left adrenal nodule, compatible with an adenoma. Unenhanced appearance of right adrenal gland and bilateral kidneys is otherwise unremarkable. Small amount of perinephric stranding bilaterally (nonspecific and unchanged). No hydroureteronephrosis. Urinary bladder is normal in  appearance. Stomach/Bowel: Unenhanced appearance of the stomach is normal. No pathologic dilatation of small bowel or colon. Appendix is not confidently identified, likely surgically absent. Regardless, there are no inflammatory changes noted adjacent to the cecum to suggest presence of an acute appendicitis at this time. Vascular/Lymphatic: Extensive atherosclerosis throughout the abdominal and pelvic vasculature, without definite aneurysm. There are multiple borderline enlarged and mildly enlarged lymph nodes throughout the abdomen and pelvis, largest of which include a 1.9 cm short axis portacaval lymph node, and a 12 mm short axis left superficial femoral lymph node. Reproductive: Prostate gland and seminal vesicles are unremarkable in appearance. Other: No inguinal hernias. No significant volume of ascites. No pneumoperitoneum. Musculoskeletal: There are no aggressive appearing lytic or blastic lesions noted in the visualized portions of the skeleton. IMPRESSION: 1. No definite acute findings to account for the patient's symptoms. Specifically, no inguinal hernia identified. 2. There are multiple borderline enlarged and minimally enlarged lymph nodes in the abdomen, pelvis and lower middle mediastinum, as discussed above. These are nonspecific, but correlation for signs and symptoms of lymphoproliferative disorder is suggested. 3. Apparent thickening and edema of the wall the gallbladder. The gallbladder is relatively decompressed on today's examination, and there are no gallstones identified. This makes the possibility of cholecystitis highly unlikely, but clinical correlation is recommended. If there is any clinical concern for gallbladder pathology, further evaluation with right upper quadrant abdominal ultrasound could be considered. 4. 2.6 x 3.2 cm low-attenuation left adrenal nodule, similar to the prior study, compatible with an adrenal adenoma. 5. Additional incidental findings, as above. Electronically  Signed   By: DVinnie LangtonM.D.   On: 09/15/2015 20:19   [place]oreaAbdomen Limited Ruq  09/16/2015  CLINICAL DATA:  Right upper quadrant pain EXAM: [place]oreaABDOMEN LIMITED - RIGHT UPPER QUADRANT COMPARISON:  CT scan 09/15/2015 FINDINGS: Gallbladder: No gallstones are noted within gallbladder. Again noted thickening of gallbladder wall up to 6 mm. There is no sonographic Murphy's sign. Common bile duct: Diameter: 5.4 mm in diameter within normal limits. Liver: No focal hepatic mass. There is diffuse increased echogenicity of the liver. IMPRESSION: No gallstones are noted within gallbladder. There is thickening of gallbladder wall up to 6 mm. Although there is no sonographic Murphy's sign clinical correlation is necessary to exclude subacute cholecystitis. Normal CBD. There is diffuse increased echogenicity of the liver consistent with fatty infiltration. Electronically Signed   By: LLahoma CrockerM.D.   On: 09/16/2015 09:53        Filed Weights   09/15/15 0500  09/16/15 0548 09/17/15 0604  Weight: 181.212 kg (399 lb 8 oz) 182.845 kg (403 lb 1.6 oz) 180.577 kg (398 lb 1.6 oz)     Microbiology: Recent Results (from the past 240 hour(s))  Blood culture (routine x 2)     Status: None   Collection Time: 09/08/15  6:03 PM  Result Value Ref Range Status   Specimen Description BLOOD RIGHT HAND  Final   Special Requests IN PEDIATRIC BOTTLE [place]  Final   Culture NO GROWTH 5 DAYS  Final   Report Status 09/13/2015 FINAL  Final  Blood culture (routine x 2)     Status: None   Collection Time: 09/08/15  7:15 PM  Result Value Ref Range Status   Specimen Description BLOOD LEFT ANTECUBITAL  Final   Special Requests IN PEDIATRIC BOTTLE 3CC  Final   Culture NO GROWTH 5 DAYS  Final   Report Status 09/13/2015 FINAL  Final       Blood Culture    Component Value Date/Time   SDES BLOOD LEFT ANTECUBITAL 09/08/2015 1915   SPECREQUEST IN PEDIATRIC BOTTLE 3CC 09/08/2015 1915   CULT NO GROWTH 5 DAYS 09/08/2015 1915    REPTSTATUS 09/13/2015 FINAL 09/08/2015 1915      Labs: Results for orders placed or performed during the hospital encounter of 09/08/15 (from the past 48 hour(s))  Glucose, capillary     Status: Abnormal   Collection Time: 09/15/15  5:15 PM  Result Value Ref Range   Glucose-Capillary 270 (H) 65 - 99 mg/dL  Glucose, capillary     Status: Abnormal   Collection Time: 09/15/15  8:06 PM  Result Value Ref Range   Glucose-Capillary 219 (H) 65 - 99 mg/dL  CBC     Status: Abnormal   Collection Time: 09/16/15  2:19 AM  Result Value Ref Range   WBC 12.8 (H) 4.0 - 10.5 K/uL   RBC 3.86 (L) 4.22 - 5.81 MIL/uL   Hemoglobin 9.7 (L) 13.0 - 17.0 g/dL   HCT 31.0 (L) 39.0 - 52.0 %   MCV 80.3 78.0 - 100.0 fL   MCH 25.1 (L) 26.0 - 34.0 pg   MCHC 31.3 30.0 - 36.0 g/dL   RDW 15.1 11.5 - 15.5 %   Platelets 374 150 - 400 K/uL  Comprehensive metabolic panel     Status: Abnormal   Collection Time: 09/16/15  2:19 AM  Result Value Ref Range   Sodium 139 135 - 145 mmol/L   Potassium 4.5 3.5 - 5.1 mmol/L   Chloride 99 (L) 101 - 111 mmol/L   CO2 28 22 - 32 mmol/L   Glucose, Bld 75 65 - 99 mg/dL   BUN 11 6 - 20 mg/dL   Creatinine, Ser 1.51 (H) 0.61 - 1.24 mg/dL   Calcium 9.4 8.9 - 10.3 mg/dL   Total Protein 6.7 6.5 - 8.1 g/dL   Albumin 2.9 (L) 3.5 - 5.0 g/dL   AST 18 15 - 41 U/L   ALT 16 (L) 17 - 63 U/L   Alkaline Phosphatase 63 38 - 126 U/L   Total Bilirubin 0.6 0.3 - 1.2 mg/dL   GFR calc non Af Amer 47 (L) >60 mL/min   GFR calc Af Amer 55 (L) >60 mL/min    Comment: (NOTE) The eGFR has been calculated using the CKD EPI equation. This calculation has not been validated in all clinical situations. eGFR's persistently <60 mL/min signify possible Chronic Kidney Disease.    Anion gap 12 5 - 15  Vancomycin, trough     Status: Abnormal   Collection Time: 09/16/15  2:20 AM  Result Value Ref Range   Vancomycin Tr 23 (H) 10.0 - 20.0 ug/mL  Glucose, capillary     Status: Abnormal   Collection Time:  09/16/15  8:01 AM  Result Value Ref Range   Glucose-Capillary 132 (H) 65 - 99 mg/dL  Glucose, capillary     Status: Abnormal   Collection Time: 09/16/15 12:22 PM  Result Value Ref Range   Glucose-Capillary 311 (H) 65 - 99 mg/dL  Glucose, capillary     Status: Abnormal   Collection Time: 09/16/15  4:54 PM  Result Value Ref Range   Glucose-Capillary 289 (H) 65 - 99 mg/dL  Glucose, capillary     Status: Abnormal   Collection Time: 09/16/15  9:04 PM  Result Value Ref Range   Glucose-Capillary 190 (H) 65 - 99 mg/dL   Comment 1 Notify RN    Comment 2 Document in Chart   Comprehensive metabolic panel     Status: Abnormal   Collection Time: 09/17/15  4:20 AM  Result Value Ref Range   Sodium 139 135 - 145 mmol/L   Potassium 3.7 3.5 - 5.1 mmol/L    Comment: DELTA CHECK NOTED   Chloride 99 (L) 101 - 111 mmol/L   CO2 29 22 - 32 mmol/L   Glucose, Bld 123 (H) 65 - 99 mg/dL   BUN 13 6 - 20 mg/dL   Creatinine, Ser 1.58 (H) 0.61 - 1.24 mg/dL   Calcium 8.7 (L) 8.9 - 10.3 mg/dL   Total Protein 6.5 6.5 - 8.1 g/dL   Albumin 2.7 (L) 3.5 - 5.0 g/dL   AST 16 15 - 41 U/L   ALT 16 (L) 17 - 63 U/L   Alkaline Phosphatase 58 38 - 126 U/L   Total Bilirubin 0.5 0.3 - 1.2 mg/dL   GFR calc non Af Amer 45 (L) >60 mL/min   GFR calc Af Amer 52 (L) >60 mL/min    Comment: (NOTE) The eGFR has been calculated using the CKD EPI equation. This calculation has not been validated in all clinical situations. eGFR's persistently <60 mL/min signify possible Chronic Kidney Disease.    Anion gap 11 5 - 15  Glucose, capillary     Status: Abnormal   Collection Time: 09/17/15  7:52 AM  Result Value Ref Range   Glucose-Capillary 149 (H) 65 - 99 mg/dL  Glucose, capillary     Status: Abnormal   Collection Time: 09/17/15 12:00 PM  Result Value Ref Range   Glucose-Capillary 174 (H) 65 - 99 mg/dL     Lipid Panel     Lab Results  Component Value Date   HGBA1C 10.5* 07/09/2015   HGBA1C 9.6* 02/13/2014   HGBA1C  10.1* 09/08/2012     Lab Results  Component Value Date   MICROALBUR 6.24* 04/07/2009   LDLCALC UNABLE TO CALCULATE IF TRIGLYCERIDE OVER 400 mg/dL 07/09/2015   CREATININE 1.58* 09/17/2015     HPI :*65 y.o. male with Multiple Medical Problems including Paroxysmal Atrial Fibrillation on Xarelto Rx, DM2, HTN, Chronic Diastolic CHF who presents to the ED with 4 days of worsening pain and swelling of his Scrotum. He denies fevers or chills. He was seen in the ED, and a Sepsis workup was initiated a CT scan was performed which revealed diffuse thickening of the left scrotal wall and perineum concerning for Inflammation or an underlying neoplastic process. Urology was consulted and saw patient in  the ED. The area spontaneously began to drain purulent fluid. He was placed on IV Vancomycin and Zosyn and referred for admission  HOSPITAL COURSE:   Sepsis ([place]) resolved/Scrotal wall abscess improving Treated with  IV antibiotics with Vancomycin and Zosyn. Blood culture negative to date white count slowly improving 21.9>13.4>14.1>12.8 ,  Repeat CT scan on 3/6 did not show any obvious infection, abscess, - Urology ruled out foruneir's gangrene and recommended outpatient follow-up -First CT scan raised the possibility of neoplasm with diffuse thickening of the posterior scrotal wall and perineum with an exophytic and irregular masslike component, urology evaluated, recommended follow-up with a scrotal ultrasound as an outpatient in approximately one month to ensure resolution of the "chronic inflammation" seen on the CT scan. This was discussed with the patient  Patient evaluated by Dr Karsten Ro 3/3, 3/5,3/6.  Scrotal ultrasound shows left hydrocele right testicular exophytic cyst, no abscess Patient will be discharged on Cipro and doxycycline and follow up with urology   Suspected cholecystitis Edema of the gallbladder wall, right upper quadrant ultrasound No gallstones are noted within  gallbladder. There is thickening of gallbladder wall up to 6 mm. Although there is no sonographic Murphy's sign , doubt subacute cholecystitis. Normal CBD. There is diffuse increased echogenicity of the liver consistent with fatty infiltration R/o 'd  cholecystitis   Atrial fibrillation - patient's CHA2DS2-VASc Score for Stroke Risk is 3, continue Diltiazem, Amiodarone, Metoprolol and Xarelto    AKI on CKD III, baseline 1.4 Creatinine slightly improved today, 2.37> 1.45>1.51 Lasix held during this admission but restarted due to complaints of worsening dependent edema  Restarted   Lasix prior to discharge given increased dependent edema,,  Continue to hold Aldactone   Chronic diastolic heart failure - Most recent 2-D echo was in May 2016 showed ejection fraction of 50-55% Restart Lasix and gradually resume  spironolactone   Morbid obesity Body mass index is 54.17 kg/(m^2).   DM - Most recent hemoglobin A1c was 10.5 in December - Patient is on U500, and Levemir, continue     Discharge Exam:    Blood pressure 133/60, pulse 75, temperature 98.7 F (37.1 C), temperature source Oral, resp. rate 22, height 6' (1.829 m), weight 180.577 kg (398 lb 1.6 oz), SpO2 92 %.  General exam: obese male in NAD Respiratory system: Clear. No increased work of breathing. No wheezing, no crackles. Difficult exam due to body habitus Cardiovascular system: regular rate and rhythm, no murmurs, gallops. No JVD. No peripheral edema.  Gastrointestinal system: Abdomen is obese, soft and nontender. Normal bowel sounds heard. Central nervous system: AxOx3. No focal deficits GU: mild scrotal swelling and erythema on left, mildly tender to palpation, mucopurulent discharge Extremities: No clubbing/cyanosis Skin: no rashes    Follow-up Information    Follow up with Nickie Retort, MD In 1 month.   Specialty:  Urology   Contact information:   [address] [place] [address] [phone]        Follow up with [facility]. Go on 09/17/2015.   Why:  at 3:30.Please bring your photo ID and insurance cards. Arrive 10 minutes prior to appointment.    Contact information:   [place] [phone]      Follow up with Lujean Amel, MD. Schedule an appointment as soon as possible for a visit in 3 days.   Specialty:  Family Medicine   Why:  Post hospital follow-up   Contact information:   [address] [place] [address] [phone]       Signed: [name] 09/17/2015, 12:10 PM        Time spent >45 mins

## 2015-09-17 NOTE — Progress Notes (Signed)
SATURATION QUALIFICATIONS: (This note is used to comply with regulatory documentation for home oxygen)  Patient Saturations on Room Air at Rest = 99%  Patient Saturations on Room Air while Ambulating = 87%  Patient Saturations on 2 Liters of oxygen while Ambulating = 95%

## 2015-09-17 NOTE — Progress Notes (Signed)
PT Cancellation Note  Patient Details Name: [name] MRN: [id number] DOB: Mar 19, 1951   Cancelled Treatment:    Reason Eval/Treat Not Completed: PT screened, no needs identified, will sign off.  Patient screened on Monday 3/6 with no needs.  Received new order for PT today.  Spoke with patient again, no change in status. Patient reports he ambulates independently in room with no dizziness, no balance issues.  Patient reports no falls at home within past 6 months.  Will sign off.   Sherald Barge M 09/17/2015, 2:09 PM

## 2015-10-07 ENCOUNTER — Ambulatory Visit: Payer: Medicare Other | Admitting: Internal Medicine

## 2015-10-14 ENCOUNTER — Encounter: Payer: Self-pay | Admitting: Internal Medicine

## 2015-10-14 ENCOUNTER — Ambulatory Visit: Payer: Medicare Other | Attending: Internal Medicine | Admitting: Internal Medicine

## 2015-10-14 VITALS — BP 148/70 | HR 105 | Temp 97.9°F | Resp 17 | Ht 72.0 in | Wt 381.0 lb

## 2015-10-14 DIAGNOSIS — I5032 Chronic diastolic (congestive) heart failure: Secondary | ICD-10-CM | POA: Diagnosis not present

## 2015-10-14 DIAGNOSIS — I10 Essential (primary) hypertension: Secondary | ICD-10-CM

## 2015-10-14 DIAGNOSIS — N183 Chronic kidney disease, stage 3 unspecified: Secondary | ICD-10-CM

## 2015-10-14 DIAGNOSIS — Z79899 Other long term (current) drug therapy: Secondary | ICD-10-CM | POA: Diagnosis not present

## 2015-10-14 DIAGNOSIS — E785 Hyperlipidemia, unspecified: Secondary | ICD-10-CM | POA: Diagnosis not present

## 2015-10-14 DIAGNOSIS — G4733 Obstructive sleep apnea (adult) (pediatric): Secondary | ICD-10-CM | POA: Diagnosis not present

## 2015-10-14 DIAGNOSIS — E1165 Type 2 diabetes mellitus with hyperglycemia: Secondary | ICD-10-CM

## 2015-10-14 DIAGNOSIS — Z794 Long term (current) use of insulin: Secondary | ICD-10-CM | POA: Diagnosis not present

## 2015-10-14 DIAGNOSIS — M1990 Unspecified osteoarthritis, unspecified site: Secondary | ICD-10-CM | POA: Insufficient documentation

## 2015-10-14 DIAGNOSIS — G629 Polyneuropathy, unspecified: Secondary | ICD-10-CM | POA: Insufficient documentation

## 2015-10-14 DIAGNOSIS — E118 Type 2 diabetes mellitus with unspecified complications: Secondary | ICD-10-CM

## 2015-10-14 DIAGNOSIS — I129 Hypertensive chronic kidney disease with stage 1 through stage 4 chronic kidney disease, or unspecified chronic kidney disease: Secondary | ICD-10-CM | POA: Insufficient documentation

## 2015-10-14 DIAGNOSIS — L0591 Pilonidal cyst without abscess: Secondary | ICD-10-CM | POA: Diagnosis not present

## 2015-10-14 DIAGNOSIS — I2510 Atherosclerotic heart disease of native coronary artery without angina pectoris: Secondary | ICD-10-CM | POA: Insufficient documentation

## 2015-10-14 DIAGNOSIS — F341 Dysthymic disorder: Secondary | ICD-10-CM | POA: Insufficient documentation

## 2015-10-14 DIAGNOSIS — Z7901 Long term (current) use of anticoagulants: Secondary | ICD-10-CM | POA: Diagnosis not present

## 2015-10-14 DIAGNOSIS — G5603 Carpal tunnel syndrome, bilateral upper limbs: Secondary | ICD-10-CM | POA: Insufficient documentation

## 2015-10-14 DIAGNOSIS — Z87891 Personal history of nicotine dependence: Secondary | ICD-10-CM | POA: Insufficient documentation

## 2015-10-14 DIAGNOSIS — E119 Type 2 diabetes mellitus without complications: Secondary | ICD-10-CM | POA: Insufficient documentation

## 2015-10-14 DIAGNOSIS — I480 Paroxysmal atrial fibrillation: Secondary | ICD-10-CM | POA: Diagnosis not present

## 2015-10-14 LAB — CBC WITH DIFFERENTIAL/PLATELET
BASOS PCT: 0 %
Basophils Absolute: 0 cells/uL (ref 0–200)
EOS ABS: 585 {cells}/uL — AB (ref 15–500)
Eosinophils Relative: 5 %
HCT: 37.6 % — ABNORMAL LOW (ref 38.5–50.0)
Hemoglobin: 12.8 g/dL — ABNORMAL LOW (ref 13.2–17.1)
LYMPHS PCT: 22 %
Lymphs Abs: 2574 cells/uL (ref 850–3900)
MCH: 26.4 pg — ABNORMAL LOW (ref 27.0–33.0)
MCHC: 34 g/dL (ref 32.0–36.0)
MCV: 77.7 fL — AB (ref 80.0–100.0)
MONO ABS: 1053 {cells}/uL — AB (ref 200–950)
MONOS PCT: 9 %
MPV: 9.7 fL (ref 7.5–12.5)
NEUTROS ABS: 7488 {cells}/uL (ref 1500–7800)
Neutrophils Relative %: 64 %
PLATELETS: 307 10*3/uL (ref 140–400)
RBC: 4.84 MIL/uL (ref 4.20–5.80)
RDW: 15.5 % — AB (ref 11.0–15.0)
WBC: 11.7 10*3/uL — AB (ref 3.8–10.8)

## 2015-10-14 LAB — CMP AND LIVER
ALT: 13 U/L (ref 9–46)
AST: 15 U/L (ref 10–35)
Albumin: 3.7 g/dL (ref 3.6–5.1)
Alkaline Phosphatase: 62 U/L (ref 40–115)
BILIRUBIN DIRECT: 0.1 mg/dL (ref <=0.2)
BILIRUBIN INDIRECT: 0.3 mg/dL (ref 0.2–1.2)
BILIRUBIN TOTAL: 0.4 mg/dL (ref 0.2–1.2)
BUN: 13 mg/dL (ref 7–25)
CALCIUM: 8.3 mg/dL — AB (ref 8.6–10.3)
CHLORIDE: 102 mmol/L (ref 98–110)
CO2: 27 mmol/L (ref 20–31)
Creat: 1.11 mg/dL (ref 0.70–1.25)
Glucose, Bld: 106 mg/dL — ABNORMAL HIGH (ref 65–99)
Potassium: 3.4 mmol/L — ABNORMAL LOW (ref 3.5–5.3)
Sodium: 140 mmol/L (ref 135–146)
Total Protein: 6.4 g/dL (ref 6.1–8.1)

## 2015-10-14 LAB — GLUCOSE, POCT (MANUAL RESULT ENTRY): POC GLUCOSE: 149 mg/dL — AB (ref 70–99)

## 2015-10-14 LAB — POCT GLYCOSYLATED HEMOGLOBIN (HGB A1C): HEMOGLOBIN A1C: 8.7

## 2015-10-14 MED ORDER — INSULIN REGULAR HUMAN (CONC) 500 UNIT/ML ~~LOC~~ SOLN: 20.0000 [IU] | Freq: Three times a day (TID) | SUBCUTANEOUS | Status: DC

## 2015-10-14 MED ORDER — CITALOPRAM HYDROBROMIDE 20 MG PO TABS: 20.0000 mg | ORAL_TABLET | Freq: Every day | ORAL | Status: DC

## 2015-10-14 MED ORDER — METOPROLOL TARTRATE 50 MG PO TABS: 50.0000 mg | ORAL_TABLET | Freq: Two times a day (BID) | ORAL | Status: DC

## 2015-10-14 NOTE — Patient Instructions (Signed)
- referral to Endocrine made  DASH Eating Plan DASH stands for "Dietary Approaches to Stop Hypertension." The DASH eating plan is a healthy eating plan that has been shown to reduce high blood pressure (hypertension). Additional health benefits may include reducing the risk of type 2 diabetes mellitus, heart disease, and stroke. The DASH eating plan may also help with weight loss. WHAT DO I NEED TO KNOW ABOUT THE DASH EATING PLAN? For the DASH eating plan, you will follow these general guidelines:  Choose foods with a percent daily value for sodium of less than 5% (as listed on the food label).  Use salt-free seasonings or herbs instead of table salt or sea salt.  Check with your health care provider or pharmacist before using salt substitutes.  Eat lower-sodium products, often labeled as "lower sodium" or "no salt added."  Eat fresh foods.  Eat more vegetables, fruits, and low-fat dairy products.  Choose whole grains. Look for the word "whole" as the first word in the ingredient list.  Choose fish and skinless chicken or [place] more often than red meat. Limit fish, poultry, and meat to 6 oz (170 g) each day.  Limit sweets, desserts, sugars, and sugary drinks.  Choose heart-healthy fats.  Limit cheese to 1 oz (28 g) per day.  Eat more home-cooked food and less restaurant, buffet, and fast food.  Limit fried foods.  Cook foods using methods other than frying.  Limit canned vegetables. If you do use them, rinse them well to decrease the sodium.  When eating at a restaurant, ask that your food be prepared with less salt, or no salt if possible. WHAT FOODS CAN I EAT? Seek help from a dietitian for individual calorie needs. Grains Whole grain or whole wheat bread. Brown rice. Whole grain or whole wheat pasta. Quinoa, bulgur, and whole grain cereals. Low-sodium cereals. Corn or whole wheat flour tortillas. Whole grain cornbread. Whole grain crackers. Low-sodium  crackers. Vegetables Fresh or frozen vegetables (raw, steamed, roasted, or grilled). Low-sodium or reduced-sodium tomato and vegetable juices. Low-sodium or reduced-sodium tomato sauce and paste. Low-sodium or reduced-sodium canned vegetables.  Fruits All fresh, canned (in natural juice), or frozen fruits. Meat and Other Protein Products Ground beef (85% or leaner), grass-fed beef, or beef trimmed of fat. Skinless chicken or [place]. Ground chicken or [place]. Pork trimmed of fat. All fish and seafood. Eggs. Dried beans, peas, or lentils. Unsalted nuts and seeds. Unsalted canned beans. Dairy Low-fat dairy products, such as skim or 1% milk, 2% or reduced-fat cheeses, low-fat ricotta or cottage cheese, or plain low-fat yogurt. Low-sodium or reduced-sodium cheeses. Fats and Oils Tub margarines without trans fats. Light or reduced-fat mayonnaise and salad dressings (reduced sodium). Avocado. Safflower, olive, or canola oils. Natural peanut or almond butter. Other Unsalted popcorn and pretzels. The items listed above may not be a complete list of recommended foods or beverages. Contact your dietitian for more options. WHAT FOODS ARE NOT RECOMMENDED? Grains White bread. White pasta. White rice. Refined cornbread. Bagels and croissants. Crackers that contain trans fat. Vegetables Creamed or fried vegetables. Vegetables in a cheese sauce. Regular canned vegetables. Regular canned tomato sauce and paste. Regular tomato and vegetable juices. Fruits Dried fruits. Canned fruit in light or heavy syrup. Fruit juice. Meat and Other Protein Products Fatty cuts of meat. Ribs, chicken wings, bacon, sausage, bologna, salami, chitterlings, fatback, hot dogs, bratwurst, and packaged luncheon meats. Salted nuts and seeds. Canned beans with salt. Dairy Whole or 2% milk, cream, half-and-half, and cream  cheese. Whole-fat or sweetened yogurt. Full-fat cheeses or blue cheese. Nondairy creamers and whipped toppings.  Processed cheese, cheese spreads, or cheese curds. Condiments Onion and garlic salt, seasoned salt, table salt, and sea salt. Canned and packaged gravies. Worcestershire sauce. Tartar sauce. Barbecue sauce. Teriyaki sauce. Soy sauce, including reduced sodium. Steak sauce. Fish sauce. Oyster sauce. Cocktail sauce. Horseradish. Ketchup and mustard. Meat flavorings and tenderizers. Bouillon cubes. Hot sauce. Tabasco sauce. Marinades. Taco seasonings. Relishes. Fats and Oils Butter, stick margarine, lard, shortening, ghee, and bacon fat. Coconut, palm kernel, or palm oils. Regular salad dressings. Other Pickles and olives. Salted popcorn and pretzels. The items listed above may not be a complete list of foods and beverages to avoid. Contact your dietitian for more information. WHERE CAN I FIND MORE INFORMATION? National Heart, Lung, and Blood Institute: [employer]   This information is not intended to replace advice given to you by your health care provider. Make sure you discuss any questions you have with your health care provider.   Document Released: 06/17/2011 Document Revised: 07/19/2014 Document Reviewed: 05/02/2013 Elsevier Interactive Patient Education 2016 [employer].    Diabetes Mellitus and Food It is important for you to manage your blood sugar (glucose) level. Your blood glucose level can be greatly affected by what you eat. Eating healthier foods in the appropriate amounts throughout the day at about the same time each day will help you control your blood glucose level. It can also help slow or prevent worsening of your diabetes mellitus. Healthy eating may even help you improve the level of your blood pressure and reach or maintain a healthy weight.  General recommendations for healthful eating and cooking habits include:  Eating meals and snacks regularly. Avoid going long periods of time without eating to lose weight.  Eating a diet  that consists mainly of plant-based foods, such as fruits, vegetables, nuts, legumes, and whole grains.  Using low-heat cooking methods, such as baking, instead of high-heat cooking methods, such as deep frying. Work with your dietitian to make sure you understand how to use the Nutrition Facts information on food labels. HOW CAN FOOD AFFECT ME? Carbohydrates Carbohydrates affect your blood glucose level more than any other type of food. Your dietitian will help you determine how many carbohydrates to eat at each meal and teach you how to count carbohydrates. Counting carbohydrates is important to keep your blood glucose at a healthy level, especially if you are using insulin or taking certain medicines for diabetes mellitus. Alcohol Alcohol can cause sudden decreases in blood glucose (hypoglycemia), especially if you use insulin or take certain medicines for diabetes mellitus. Hypoglycemia can be a life-threatening condition. Symptoms of hypoglycemia (sleepiness, dizziness, and disorientation) are similar to symptoms of having too much alcohol.  If your health care provider has given you approval to drink alcohol, do so in moderation and use the following guidelines:  Women should not have more than one drink per day, and men should not have more than two drinks per day. One drink is equal to:  12 oz of beer.  5 oz of wine.  1 oz of hard liquor.  Do not drink on an empty stomach.  Keep yourself hydrated. Have water, diet soda, or unsweetened iced tea.  Regular soda, juice, and other mixers might contain a lot of carbohydrates and should be counted. WHAT FOODS ARE NOT RECOMMENDED? As you make food choices, it is important to remember that all foods are not the same. Some foods have fewer  nutrients per serving than other foods, even though they might have the same number of calories or carbohydrates. It is difficult to get your body what it needs when you eat foods with fewer nutrients.  Examples of foods that you should avoid that are high in calories and carbohydrates but low in nutrients include:  Trans fats (most processed foods list trans fats on the Nutrition Facts label).  Regular soda.  Juice.  Candy.  Sweets, such as cake, pie, doughnuts, and cookies.  Fried foods. WHAT FOODS CAN I EAT? Eat nutrient-rich foods, which will nourish your body and keep you healthy. The food you should eat also will depend on several factors, including:  The calories you need.  The medicines you take.  Your weight.  Your blood glucose level.  Your blood pressure level.  Your cholesterol level. You should eat a variety of foods, including:  Protein.  Lean cuts of meat.  Proteins low in saturated fats, such as fish, egg whites, and beans. Avoid processed meats.  Fruits and vegetables.  Fruits and vegetables that may help control blood glucose levels, such as apples, mangoes, and yams.  Dairy products.  Choose fat-free or low-fat dairy products, such as milk, yogurt, and cheese.  Grains, bread, pasta, and rice.  Choose whole grain products, such as multigrain bread, whole oats, and brown rice. These foods may help control blood pressure.  Fats.  Foods containing healthful fats, such as nuts, avocado, olive oil, canola oil, and fish. DOES EVERYONE WITH DIABETES MELLITUS HAVE THE SAME MEAL PLAN? Because every person with diabetes mellitus is different, there is not one meal plan that works for everyone. It is very important that you meet with a dietitian who will help you create a meal plan that is just right for you.   This information is not intended to replace advice given to you by your health care provider. Make sure you discuss any questions you have with your health care provider.   Document Released: 03/25/2005 Document Revised: 07/19/2014 Document Reviewed: 05/25/2013 Elsevier Interactive Patient Education [employer]

## 2015-10-14 NOTE — Progress Notes (Signed)
[name], is a 65 y.o. male  KYH:[id number]  [id number]  DOB - April 30, 1951  CC:  Chief Complaint  Patient presents with  . New Patient (Initial Visit)       HPI: [name] is a 65 y.o. male here today to establish medical care.  He use to get his care in the [facility] system, but states he was fired from there systems due to no shows. He was recently hospitalized 2/27-3/8 for scrotal abscess, ooc dm2, htn.  He has chronic diastolic chf, as well as paroxysmal afib for which he is on Xarelto. He has finished all his antibiotics, and scrotal abscess/wound has since resolved.  He has f/u w/ Urology tomorw.  He use to see [facility] Endocrine, but they have also signed off on him/fired him from service as well, per patient.   Patient has No headache, No chest pain, No abdominal pain - No Nausea, No new weakness tingling or numbness, No Cough - SOB.  Not very motivated, wants to get back to exercise, but feels weak/tired, not motivated.  Cannot stop drinking his Pepsi.  No Known Allergies Past Medical History  Diagnosis Date  . Hypertension   . Chronic diastolic CHF (congestive heart failure) (HCC)   . CAD (coronary artery disease)     a. s/p PCI to RCA in 2012. b. prior cath in 01/2014 with elevated L/RH pressures, mild-mod CAD of LAD/LCx with patent RCA, normal EF, c/b CIN/CHF.  [name] PAF (paroxysmal atrial fibrillation) (HCC)     TEE DCCV 09/23/2014  . Anemia   . Pilonidal cyst 1980's; 01/25/2013  . Neuropathy (HCC)   . History of blood transfusion ~ 1954    "related to OR"  . Carpal tunnel syndrome, bilateral   . Morbid obesity (HCC)   . Hyperlipidemia   . OSA (obstructive sleep apnea)     "I wear nasal prongs; haven't been using prongs recently" (09/19/2014)  . Type II diabetes mellitus (HCC)   . Arthritis     "hands and lower back" (09/19/2014)  . Chronic lower back pain   . Chronic kidney disease (CKD), stage III (moderate)   . Cellulitis   . Hypoxia     a. Qualified for  home O2 at [place] in 09/2014.  [name] Physical deconditioning    Current Outpatient Prescriptions on File Prior to Visit  Medication Sig Dispense Refill  . amiodarone (PACERONE) 200 MG tablet Take 200 mg by mouth daily.    . furosemide (LASIX) 80 MG tablet Take 1 tablet (80 mg total) by mouth 2 (two) times daily. 180 tablet 2  . gabapentin (NEURONTIN) 300 MG capsule Take 2 capsules (600 mg total) by mouth 2 (two) times daily. 60 capsule 0  . Insulin Detemir (LEVEMIR FLEXTOUCH) 100 UNIT/ML Pen Inject 20 Units into the skin daily. 15 mL 11  . lisinopril (PRINIVIL,ZESTRIL) 10 MG tablet Take 1 tablet (10 mg total) by mouth daily. 90 tablet 2  . oxyCODONE 10 MG TABS Take 1 tablet (10 mg total) by mouth every 6 (six) hours as needed for severe pain. 30 tablet 0  . potassium chloride SA (KLOR-CON M20) 20 MEQ tablet Take 1 tablet (20 mEq total) by mouth 2 (two) times daily. (Patient taking differently: Take 20 mEq by mouth daily. ) 60 tablet 6  . XARELTO 20 MG TABS tablet TAKE 1 TABLET (20 MG TOTAL) BY MOUTH DAILY WITH SUPPER. 30 tablet 3  . ciprofloxacin (CIPRO) 500 MG tablet Take 1 tablet (500 mg total)  by mouth 2 (two) times daily. (Patient not taking: Reported on 10/14/2015) 20 tablet 0  . diltiazem (CARDIZEM CD) 120 MG 24 hr capsule Take 1 capsule (120 mg total) by mouth daily. 30 capsule 3  . doxycycline (MONODOX) 100 MG capsule Take 1 capsule (100 mg total) by mouth 2 (two) times daily. (Patient not taking: Reported on 10/14/2015) 20 capsule 0  . Hypromellose (NATURAL BALANCE TEARS OP) Place 1 drop into both eyes daily as needed (for dry eyes).     . Insulin Pen Needle 31G X 8 MM MISC Use to inject insulin daily as instructed 100 each 3  . Multiple Vitamin (MULTIVITAMIN WITH MINERALS) TABS Take 1 tablet by mouth daily.    . rosuvastatin (CRESTOR) 40 MG tablet Take 40 mg by mouth daily.     No current facility-administered medications on file prior to visit.   Family History  Problem Relation Age of Onset    . Adopted: Yes  . Other Other     PT ADOPTED   Social History   Social History  . Marital Status: Single    Spouse Name: N/A  . Number of Children: Y  . Years of Education: N/A   Occupational History  . disabled truck driver    Social History Main Topics  . Smoking status: Former Smoker -- 1.00 packs/day for 20 years    Types: Cigarettes    Quit date: 07/13/1983  . Smokeless tobacco: Never Used  . Alcohol Use: No  . Drug Use: No  . Sexual Activity: Not Currently   Other Topics Concern  . Not on file   Social History Narrative   Pt is single.   Lives with sister.   Has children.   Was adopted.   Daily cafffiene-2 cups of coffee and 2 sodas per day          Review of Systems: Constitutional: Negative for fever, chills, diaphoresis, activity change, appetite change; + fatigue. HENT: Negative for ear pain, nosebleeds, congestion, facial swelling, rhinorrhea, neck pain, neck stiffness and ear discharge.  Eyes: Negative for pain, discharge, redness, itching and visual disturbance. Respiratory: Negative for cough, choking, chest tightness, shortness of breath, wheezing and stridor.  Cardiovascular: Negative for chest pain, palpitations and leg swelling. Gastrointestinal: Negative for abdominal distention. Genitourinary: Negative for dysuria, urgency, frequency, hematuria, flank pain, decreased urine volume, difficulty urinating and dyspareunia.  Musculoskeletal: Negative for back pain, joint swelling, arthralgia and gait problem. Neurological: Negative for dizziness, tremors, seizures, syncope, facial asymmetry, speech difficulty, weakness, light-headedness, numbness and headaches.  Hematological: Negative for adenopathy. Does not bruise/bleed easily. Psychiatric/Behavioral: Negative for hallucinations, behavioral problems, confusion, dysphoric mood, decreased concentration and agitation.    Objective:   Filed Vitals:   10/14/15 1521  BP: 148/70  Pulse: 105  Temp:  97.9 F (36.6 C)  Resp: 17    Physical Exam: Constitutional: Patient appears well-developed and well-nourished. No distress. AAOx3, morbid obese, bmi 54  HENT: Normocephalic, atraumatic, External right and left ear normal. Oropharynx is clear and moist.  Eyes: Conjunctivae and EOM are normal. PERRL, no scleral icterus. Neck: Normal ROM. Neck supple. No JVD. No tracheal deviation. No thyromegaly. CVS: RRR, S1/S2 +, no murmurs, no gallops, no carotid bruit.  Pulmonary: Effort and breath sounds normal, no stridor, rhonchi, wheezes, rales.  Abdominal: Soft. Obese, BS +, no distension, tenderness, rebound or guarding.  Musculoskeletal: Normal range of motion. No edema and no tenderness.  Lymphadenopathy: No lymphadenopathy noted, cervical LE; dry les, no c/c/e Neuro:  Alert. Normal reflexes, muscle tone coordination. No cranial nerve deficit grossly. Skin: Skin is warm and dry. No rash noted. Not diaphoretic. No erythema. No pallor. Psychiatric: Normal mood and affect. Behavior, judgment, thought content normal.  Lab Results  Component Value Date   WBC 12.8* 09/16/2015   HGB 9.7* 09/16/2015   HCT 31.0* 09/16/2015   MCV 80.3 09/16/2015   PLT 374 09/16/2015   Lab Results  Component Value Date   CREATININE 1.58* 09/17/2015   BUN 13 09/17/2015   NA 139 09/17/2015   K 3.7 09/17/2015   CL 99* 09/17/2015   CO2 29 09/17/2015    Lab Results  Component Value Date   HGBA1C 8.7 10/14/2015   Lipid Panel     Component Value Date/Time   CHOL 258* 07/09/2015 0238   TRIG 406* 07/09/2015 0238   HDL 32* 07/09/2015 0238   CHOLHDL 8.1 07/09/2015 0238   VLDL UNABLE TO CALCULATE IF TRIGLYCERIDE OVER 400 mg/dL 62/83/1517 6160   LDLCALC UNABLE TO CALCULATE IF TRIGLYCERIDE OVER 400 mg/dL 73/71/0626 9485       Assessment and plan:   1. Type 2 diabetes mellitus without complication, with long-term current use of insulin (HCC) - per pt, he is taking 20units of Concentrated Humulin R, wants to  go back to his novolog, but I d/w pharmacist, that would be too much insulin in the conversion - per pt, sugars are labile, ranges 45-365, depending on what he eats/drinks, cannot stop drinking Pepsi - Glucose (CBG) 149 - HgB A1c 8.7 - Ambulatory referral to Endocrinology, to eval DM, appreciate assistance - use to see Endo w/ [facility] but was fired by their group - ADA/DASH diet discussed, recd increase exercise, low impact such as swimming, pt will consider it.  2. Essential hypertension, benign - increased bb to metoprolol 50bid today. - CMP and Liver - CBC with Differential  3. CKD (chronic kidney disease) stage 3, GFR 30-59 ml/min - labs today  4. Morbid obesity due to excess calories (HCC) Diet/exericse recd, info given.  5. Dysthymia, possible depression, likely multifactorial from multiple medical problems - denies si/hi/ah/vh - trial celexa 20mg  po qd. - may be enough to motivate him to diet/exercise     Fu 2 months   The patient was given clear instructions to go to ER or return to medical center if symptoms don't improve, worsen or new problems develop. The patient verbalized understanding. The patient was told to call to get lab results if they haven't heard anything in the next week.      , MD, MBA/MHA [facility] [place], [place]   10/14/2015, 4:04 PM

## 2015-10-14 NOTE — Progress Notes (Signed)
Patient here to establish care Patient has history of a fib HTN and diabetes

## 2015-10-15 ENCOUNTER — Other Ambulatory Visit: Payer: Self-pay | Admitting: Internal Medicine

## 2015-10-15 DIAGNOSIS — E876 Hypokalemia: Secondary | ICD-10-CM

## 2015-10-15 MED ORDER — POTASSIUM CHLORIDE CRYS ER 20 MEQ PO TBCR: 20.0000 meq | EXTENDED_RELEASE_TABLET | Freq: Every day | ORAL | Status: DC

## 2015-11-01 ENCOUNTER — Other Ambulatory Visit: Payer: Self-pay | Admitting: Cardiology

## 2015-11-11 ENCOUNTER — Telehealth: Payer: Self-pay | Admitting: Internal Medicine

## 2015-11-11 MED ORDER — INSULIN REGULAR HUMAN (CONC) 500 UNIT/ML ~~LOC~~ SOLN: 20.0000 [IU] | Freq: Three times a day (TID) | SUBCUTANEOUS | Status: DC

## 2015-11-11 NOTE — Telephone Encounter (Signed)
Please call pt and tell him I have renewed his insulin. He should chk with pharmacy to see when ready for pick up. Thanks.

## 2015-11-11 NOTE — Telephone Encounter (Signed)
Patient is requesting a refill for insulin regular human CONCENTRATED (HUMULIN R) 500 UNIT/ML injection [[id number]]..... Please follow up with patient

## 2015-11-12 NOTE — Telephone Encounter (Signed)
Clld pt - advsd medication refill of insulin regular human concentrated (Humulin R) 500 unit/mL has been electronically sent to [facility] and he can call them to see if its ready for pick up. Pt stated he understood,

## 2015-11-18 ENCOUNTER — Encounter: Payer: Self-pay | Admitting: Podiatry

## 2015-11-18 ENCOUNTER — Ambulatory Visit (INDEPENDENT_AMBULATORY_CARE_PROVIDER_SITE_OTHER): Payer: Medicare Other | Admitting: Podiatry

## 2015-11-18 VITALS — BP 143/71 | HR 93 | Resp 16

## 2015-11-18 DIAGNOSIS — M79676 Pain in unspecified toe(s): Secondary | ICD-10-CM

## 2015-11-18 DIAGNOSIS — B351 Tinea unguium: Secondary | ICD-10-CM

## 2015-11-18 NOTE — Patient Instructions (Signed)
Do not pull dry skin from your toes. Stand with a pumice stone or callus file and apply skin lotion or Vaseline  Diabetes and Foot Care Diabetes may cause you to have problems because of poor blood supply (circulation) to your feet and legs. This may cause the skin on your feet to become thinner, break easier, and heal more slowly. Your skin may become dry, and the skin may peel and crack. You may also have nerve damage in your legs and feet causing decreased feeling in them. You may not notice minor injuries to your feet that could lead to infections or more serious problems. Taking care of your feet is one of the most important things you can do for yourself.  HOME CARE INSTRUCTIONS  Wear shoes at all times, even in the house. Do not go barefoot. Bare feet are easily injured.  Check your feet daily for blisters, cuts, and redness. If you cannot see the bottom of your feet, use a mirror or ask someone for help.  Wash your feet with warm water (do not use hot water) and mild soap. Then pat your feet and the areas between your toes until they are completely dry. Do not soak your feet as this can dry your skin.  Apply a moisturizing lotion or petroleum jelly (that does not contain alcohol and is unscented) to the skin on your feet and to dry, brittle toenails. Do not apply lotion between your toes.  Trim your toenails straight across. Do not dig under them or around the cuticle. File the edges of your nails with an emery board or nail file.  Do not cut corns or calluses or try to remove them with medicine.  Wear clean socks or stockings every day. Make sure they are not too tight. Do not wear knee-high stockings since they may decrease blood flow to your legs.  Wear shoes that fit properly and have enough cushioning. To break in new shoes, wear them for just a few hours a day. This prevents you from injuring your feet. Always look in your shoes before you put them on to be sure there are no objects  inside.  Do not cross your legs. This may decrease the blood flow to your feet.  If you find a minor scrape, cut, or break in the skin on your feet, keep it and the skin around it clean and dry. These areas may be cleansed with mild soap and water. Do not cleanse the area with peroxide, alcohol, or iodine.  When you remove an adhesive bandage, be sure not to damage the skin around it.  If you have a wound, look at it several times a day to make sure it is healing.  Do not use heating pads or hot water bottles. They may burn your skin. If you have lost feeling in your feet or legs, you may not know it is happening until it is too late.  Make sure your health care provider performs a complete foot exam at least annually or more often if you have foot problems. Report any cuts, sores, or bruises to your health care provider immediately. SEEK MEDICAL CARE IF:   You have an injury that is not healing.  You have cuts or breaks in the skin.  You have an ingrown nail.  You notice redness on your legs or feet.  You feel burning or tingling in your legs or feet.  You have pain or cramps in your legs and feet.  Your legs or feet are numb.  Your feet always feel cold. SEEK IMMEDIATE MEDICAL CARE IF:   There is increasing redness, swelling, or pain in or around a wound.  There is a red line that goes up your leg.  Pus is coming from a wound.  You develop a fever or as directed by your health care provider.  You notice a bad smell coming from an ulcer or wound.   This information is not intended to replace advice given to you by your health care provider. Make sure you discuss any questions you have with your health care provider.   Document Released: 06/25/2000 Document Revised: 02/28/2013 Document Reviewed: 12/05/2012 Elsevier Interactive Patient Education [employer].

## 2015-11-18 NOTE — Progress Notes (Deleted)
   Subjective:    Patient ID: [name], male    DOB: 1951/01/09, 65 y.o.   MRN: [id number]  HPI    Review of Systems  All other systems reviewed and are negative.      Objective:   Physical Exam        Assessment & Plan:

## 2015-11-18 NOTE — Progress Notes (Signed)
Patient ID: [name], male   DOB: 06-Aug-1950, 65 y.o.   MRN: [id number]  Subjective: This patient presents today with the last visit for this problem on 10/14/2014. He is complaining of thickened elongated toenails and for cough or walking wearing shoes and requests toenail debridement. Patient states that he pulled on dry skin for 5 days ago causing bleeding in the right great toe.  Objective: No open skin lesions bilaterally Dried blood around the right hallux without any sign of erythema, edema, warmth or drainage Absent second right toenail with eschar over nailbed without any surrounding erythema, edema, drainage The toenails are extremely elongated, brittle, deformed, hypertrophic and tender to direct palpation 6-10  Assessment:  Dried blood right hallux and absent second right toe without a clinical sign of infection Symptomatic onychomycoses 6-10  Plan: Patient advised not to pull it dry skin rather use a pumice stone or callus file and apply skin lotion or Vaseline to the area The toenails 6-10 are debrided mechanically and electronically without any bleeding  Reappoint 3 months

## 2015-11-19 ENCOUNTER — Other Ambulatory Visit: Payer: Self-pay | Admitting: Internal Medicine

## 2015-11-20 ENCOUNTER — Encounter: Payer: Self-pay | Admitting: Physician Assistant

## 2015-11-20 ENCOUNTER — Ambulatory Visit (INDEPENDENT_AMBULATORY_CARE_PROVIDER_SITE_OTHER): Payer: Medicare Other | Admitting: Physician Assistant

## 2015-11-20 VITALS — BP 146/66 | HR 90 | Ht 72.0 in | Wt 399.0 lb

## 2015-11-20 DIAGNOSIS — R0600 Dyspnea, unspecified: Secondary | ICD-10-CM

## 2015-11-20 DIAGNOSIS — E785 Hyperlipidemia, unspecified: Secondary | ICD-10-CM | POA: Diagnosis not present

## 2015-11-20 DIAGNOSIS — G4733 Obstructive sleep apnea (adult) (pediatric): Secondary | ICD-10-CM

## 2015-11-20 DIAGNOSIS — I5031 Acute diastolic (congestive) heart failure: Secondary | ICD-10-CM | POA: Diagnosis not present

## 2015-11-20 DIAGNOSIS — I481 Persistent atrial fibrillation: Secondary | ICD-10-CM | POA: Diagnosis not present

## 2015-11-20 DIAGNOSIS — I2583 Coronary atherosclerosis due to lipid rich plaque: Secondary | ICD-10-CM

## 2015-11-20 DIAGNOSIS — N183 Chronic kidney disease, stage 3 unspecified: Secondary | ICD-10-CM

## 2015-11-20 DIAGNOSIS — R0602 Shortness of breath: Secondary | ICD-10-CM | POA: Diagnosis not present

## 2015-11-20 DIAGNOSIS — I5033 Acute on chronic diastolic (congestive) heart failure: Secondary | ICD-10-CM

## 2015-11-20 DIAGNOSIS — I2510 Atherosclerotic heart disease of native coronary artery without angina pectoris: Secondary | ICD-10-CM

## 2015-11-20 DIAGNOSIS — I4819 Other persistent atrial fibrillation: Secondary | ICD-10-CM

## 2015-11-20 MED ORDER — FUROSEMIDE 80 MG PO TABS: 80.0000 mg | ORAL_TABLET | Freq: Two times a day (BID) | ORAL | Status: DC

## 2015-11-20 MED ORDER — METOLAZONE 5 MG PO TABS: 5.0000 mg | ORAL_TABLET | Freq: Every day | ORAL | Status: DC

## 2015-11-20 NOTE — Addendum Note (Signed)
Addended by: Oleta Mouse on: 11/20/2015 09:38 AM   Modules accepted: Orders

## 2015-11-20 NOTE — Progress Notes (Signed)
Patient ID: [name], male   DOB: Mar 24, 1951, 65 y.o.   MRN: [id number]    Date:  11/20/2015   ID:  [name], DOB 05-Mar-1951, MRN [id number]  PCP:  Paul Glatter, MD  Primary Cardiologist:  [name]   Chief complaint: Worsening lower extremity edema dyspnea   History of Present Illness: [name] is a 65 y.o. male with history of chronic diastolic CHF, CAD (s/p PCI to RCA in 2012, prior cath in 01/2014 with elevated L/RH pressures, mild-mod CAD of LAD/LCx with patent RCA, normal EF, c/b CIN/CHF), morbid obesity, paroxysmal atrial fibrillation, cellulitis, OSA (unable to tolerate CPAP), DM, HLD, anemia, CKD stage III, deconditioning who presented as an add-on visit for profound dyspnea.  He was admitted in 09/2014 when he presented to the office with tachypnea, tachycardia and hypoxia, and 19lb weight gain (408lb). He was admitted for IV diuresis and IV diltiazem for AF-RVR (109bpm). He underwent a TEE cardioversion on 09/23/2014 which showed EF 55-60%, no evidence of vegetation or LV thrombus, moderate TR, mild MR. He subsequent received cardioversion. He qualified for home O2. Discharge weight 396lb.  He was seen in the ER 11/27/14 for fall when his blood sugar dropped, scrotal pain and difficulty with urination.  Scrotal sx were felt due to hydrocele. His Lasix was increased to 120mg  BID for the next 5 days and potassium was increased. He says he did not make these medicine changes.  [name] was admitted in March with sepsis secondary to scrotal wall abscess.    He is here now for usual follow-up appointment. He reports his weight has gone up 13 pounds acutely. He gets short of breath with activity and notices a decrease in oxygen level checks it. He has orthopnea and sleeping in a recliner for quite a long time.  His considerable amount of lower extremity edema. Based on our weights in the computer he is up from 381-399. He says his been taken 160 mg of Lasix twice a day  which is double what he normally takes because of the weight gain. He says he drinks a lot of fluid on the daily basis.  He has a CPAP and never uses it.    The patient currently denies nausea, vomiting, fever, chest pain, dizziness, PND, cough, congestion, abdominal pain, hematochezia, melena,  claudication.  Wt Readings from Last 3 Encounters:  11/20/15 399 lb (180.985 kg)  10/14/15 381 lb (172.82 kg)  09/17/15 398 lb 1.6 oz (180.577 kg)     Past Medical History  Diagnosis Date  . Hypertension   . Chronic diastolic CHF (congestive heart failure) (HCC)   . CAD (coronary artery disease)     a. s/p PCI to RCA in 2012. b. prior cath in 01/2014 with elevated L/RH pressures, mild-mod CAD of LAD/LCx with patent RCA, normal EF, c/b CIN/CHF.  02/2014 PAF (paroxysmal atrial fibrillation) (HCC)     TEE DCCV 09/23/2014  . Anemia   . Pilonidal cyst 1980's; 01/25/2013  . Neuropathy (HCC)   . History of blood transfusion ~ 1954    "related to OR"  . Carpal tunnel syndrome, bilateral   . Morbid obesity (HCC)   . Hyperlipidemia   . OSA (obstructive sleep apnea)     "I wear nasal prongs; haven't been using prongs recently" (09/19/2014)  . Type II diabetes mellitus (HCC)   . Arthritis     "hands and lower back" (09/19/2014)  . Chronic lower back pain   . Chronic kidney  disease (CKD), stage III (moderate)   . Cellulitis   . Hypoxia     a. Qualified for home O2 at [facility] in 09/2014.  [name] Physical deconditioning     Current Outpatient Prescriptions  Medication Sig Dispense Refill  . amiodarone (PACERONE) 200 MG tablet Take 200 mg by mouth daily.    . furosemide (LASIX) 80 MG tablet Take 1 tablet (80 mg total) by mouth 2 (two) times daily. 180 tablet 2  . gabapentin (NEURONTIN) 300 MG capsule Take 2 capsules (600 mg total) by mouth 2 (two) times daily. 60 capsule 0  . Hypromellose (NATURAL BALANCE TEARS OP) Place 1 drop into both eyes daily as needed (for dry eyes).     . Insulin Detemir (LEVEMIR FLEXTOUCH)  100 UNIT/ML Pen Inject 20 Units into the skin daily. 15 mL 11  . insulin regular human CONCENTRATED (HUMULIN R) 500 UNIT/ML injection Inject 0.04 mLs (20 Units total) into the skin 3 (three) times daily before meals. 20 mL 11  . lisinopril (PRINIVIL,ZESTRIL) 10 MG tablet Take 1 tablet (10 mg total) by mouth daily. 90 tablet 2  . metoprolol (LOPRESSOR) 50 MG tablet Take 50 mg by mouth 2 (two) times daily. TWO TABLETS TWICE A DAY    . Multiple Vitamin (MULTIVITAMIN WITH MINERALS) TABS Take 1 tablet by mouth daily.    [name] oxyCODONE 10 MG TABS Take 1 tablet (10 mg total) by mouth every 6 (six) hours as needed for severe pain. (Patient taking differently: Take 20 mg by mouth every 6 (six) hours as needed for severe pain. ) 30 tablet 0  . Oxycodone HCl 20 MG TABS Take 1 tablet by mouth every 6 (six) hours as needed. for pain  0  . potassium chloride SA (KLOR-CON M20) 20 MEQ tablet Take 1 tablet (20 mEq total) by mouth daily. 60 tablet 6  . rosuvastatin (CRESTOR) 40 MG tablet Take 40 mg by mouth daily.    Carlena Hurl 20 MG TABS tablet TAKE 1 TABLET (20 MG TOTAL) BY MOUTH DAILY WITH SUPPER. 30 tablet 3   No current facility-administered medications for this visit.    Allergies:   No Known Allergies  Social History:  The patient  reports that he quit smoking about 32 years ago. His smoking use included Cigarettes. He has a 20 pack-year smoking history. He has never used smokeless tobacco. He reports that he does not drink alcohol or use illicit drugs.   Family history:   Family History  Problem Relation Age of Onset  . Adopted: Yes  . Other Other     PT ADOPTED    ROS:  Please see the history of present illness.  All other systems reviewed and negative.   PHYSICAL EXAM: VS:  BP 146/66 mmHg  Pulse 90  Ht 6' (1.829 m)  Wt 399 lb (180.985 kg)  BMI 54.10 kg/m2  SpO2 96% Obese, well developed, in no acute distress HEENT: Pupils are equal round react to light accommodation extraocular movements are  intact.  Neck: no JVDNo cervical lymphadenopathy. Cardiac: Regular rate and rhythm without murmurs rubs or gallops. Lungs:  clear to auscultation bilaterally, no wheezing, rhonchi or rales Abd: soft, nontender, positive bowel sounds all quadrants, no hepatosplenomegaly Ext: 1+ tense lower extremity edema only up to his thighs..  2+ radial and dorsalis pedis pulses. Skin: warm and dry Neuro:  Grossly normal  EKG:  Atrial fibrillation with a rate of 90 bpm  ASSESSMENT AND PLAN:  Problem List Items Addressed This  Visit    Obstructive sleep apnea- unable to tolerate c-pap (Chronic)   OBESITY-MORBID BMI 54 (Chronic)   Hyperlipidemia (Chronic)   Relevant Medications   metoprolol (LOPRESSOR) 50 MG tablet   Dyspnea   Coronary Artery Disease (Chronic)   Relevant Medications   metoprolol (LOPRESSOR) 50 MG tablet   CKD (chronic kidney disease) stage 3, GFR 30-59 ml/min   Atrial fibrillation- DCCV 2010 (Chronic)   Relevant Medications   metoprolol (LOPRESSOR) 50 MG tablet   Acute on chronic diastolic heart failure (HCC)   Relevant Medications   metoprolol (LOPRESSOR) 50 MG tablet    Other Visit Diagnoses    SOB (shortness of breath)    -  Primary    Acute diastolic congestive heart failure (HCC)        Relevant Medications    metoprolol (LOPRESSOR) 50 MG tablet       Mr. Dinan is up 18 pounds since the beginning of April. Is considerable lower extremity edema only up to his thighs. He's been taking 160 mg of Lasix twice a day which is twice his normal dose. He also drinks considerable amount of fluid on a daily basis. I've asked him to resume 80 mg of Lasix twice a day and have added metolazone 5 mg before his morning dose. I will restrict his fluid intake to 1/2 quarts. We'll check basic metabolic panel today since she's been taken extra Lasix and his chronic kidney disease. He was also hypokalemic mildly on his last set of labs in April. Check 2-D echocardiogram follow-up in 2 weeks  with APAP. Blood pressures mildly elevated with the additional Lasix will help that. Continue ACE inhibitor. He continues in atrial fibrillation. He is on Xarelto 20 mg daily. Rate is controlled on 50 mg of Lopressor twice daily. And amiodarone 200 mg daily. Continue Crestor for hyperlipidemia.    We'll see improvement in heart failure signs and symptoms may need to be admitted however at this time I don't think it's necessary.

## 2015-11-20 NOTE — Patient Instructions (Addendum)
Medication Instructions:   MAKE SURE YOU ARE TAKING 80 MG OF LASIX TWICE A DAY   START TAKING METOLAZONE 5 MG 30 MINUTES BEFORE TAKING A LASIX   If you need a refill on your cardiac medications before your next appointment, please call your pharmacy.  Labwork: BMET TODAY    Testing/Procedures: Your physician has requested that you have an echocardiogram. Echocardiography is a painless test that uses sound waves to create images of your heart. It provides your doctor with information about the size and shape of your heart and how well your heart's chambers and valves are working. This procedure takes approximately one hour. There are no restrictions for this procedure.     Follow-Up: IN 2 WEEKS WITH AN APP    Any Other Special Instructions Will Be Listed Below (If Applicable).

## 2015-11-25 ENCOUNTER — Other Ambulatory Visit (HOSPITAL_COMMUNITY): Payer: Medicare Other

## 2015-12-04 ENCOUNTER — Ambulatory Visit: Payer: Medicare Other | Admitting: Nurse Practitioner

## 2015-12-05 ENCOUNTER — Ambulatory Visit: Payer: Medicare Other | Admitting: Internal Medicine

## 2015-12-10 ENCOUNTER — Other Ambulatory Visit (HOSPITAL_COMMUNITY): Payer: Medicare Other

## 2015-12-12 ENCOUNTER — Telehealth: Payer: Self-pay | Admitting: *Deleted

## 2015-12-12 NOTE — Telephone Encounter (Signed)
Cancel Rsn: Patient (pt called in and cancelled until further notice)

## 2015-12-23 ENCOUNTER — Inpatient Hospital Stay (HOSPITAL_COMMUNITY): Payer: Medicare Other

## 2015-12-23 ENCOUNTER — Emergency Department (HOSPITAL_COMMUNITY): Payer: Medicare Other

## 2015-12-23 ENCOUNTER — Encounter (HOSPITAL_COMMUNITY): Payer: Self-pay

## 2015-12-23 ENCOUNTER — Inpatient Hospital Stay (HOSPITAL_COMMUNITY)
Admission: EM | Admit: 2015-12-23 | Discharge: 2015-12-30 | DRG: 564 | Disposition: A | Discharge status: Skilled Nursing Facility | Payer: Medicare Other | Attending: Family Medicine | Admitting: Family Medicine

## 2015-12-23 DIAGNOSIS — Z9981 Dependence on supplemental oxygen: Secondary | ICD-10-CM

## 2015-12-23 DIAGNOSIS — S90414A Abrasion, right lesser toe(s), initial encounter: Secondary | ICD-10-CM | POA: Diagnosis present

## 2015-12-23 DIAGNOSIS — N5089 Other specified disorders of the male genital organs: Secondary | ICD-10-CM

## 2015-12-23 DIAGNOSIS — T796XXA Traumatic ischemia of muscle, initial encounter: Principal | ICD-10-CM | POA: Diagnosis present

## 2015-12-23 DIAGNOSIS — S92301A Fracture of unspecified metatarsal bone(s), right foot, initial encounter for closed fracture: Secondary | ICD-10-CM

## 2015-12-23 DIAGNOSIS — N492 Inflammatory disorders of scrotum: Secondary | ICD-10-CM | POA: Diagnosis present

## 2015-12-23 DIAGNOSIS — D509 Iron deficiency anemia, unspecified: Secondary | ICD-10-CM | POA: Diagnosis present

## 2015-12-23 DIAGNOSIS — R296 Repeated falls: Secondary | ICD-10-CM | POA: Diagnosis present

## 2015-12-23 DIAGNOSIS — S93324A Dislocation of tarsometatarsal joint of right foot, initial encounter: Secondary | ICD-10-CM

## 2015-12-23 DIAGNOSIS — M545 Low back pain: Secondary | ICD-10-CM | POA: Diagnosis present

## 2015-12-23 DIAGNOSIS — M069 Rheumatoid arthritis, unspecified: Secondary | ICD-10-CM | POA: Diagnosis present

## 2015-12-23 DIAGNOSIS — Z6841 Body Mass Index (BMI) 40.0 and over, adult: Secondary | ICD-10-CM

## 2015-12-23 DIAGNOSIS — W19XXXA Unspecified fall, initial encounter: Secondary | ICD-10-CM | POA: Diagnosis present

## 2015-12-23 DIAGNOSIS — N183 Chronic kidney disease, stage 3 (moderate): Secondary | ICD-10-CM | POA: Diagnosis present

## 2015-12-23 DIAGNOSIS — Z66 Do not resuscitate: Secondary | ICD-10-CM | POA: Diagnosis present

## 2015-12-23 DIAGNOSIS — Z794 Long term (current) use of insulin: Secondary | ICD-10-CM | POA: Diagnosis not present

## 2015-12-23 DIAGNOSIS — E669 Obesity, unspecified: Secondary | ICD-10-CM

## 2015-12-23 DIAGNOSIS — G4733 Obstructive sleep apnea (adult) (pediatric): Secondary | ICD-10-CM | POA: Diagnosis present

## 2015-12-23 DIAGNOSIS — S31809D Unspecified open wound of unspecified buttock, subsequent encounter: Secondary | ICD-10-CM | POA: Diagnosis not present

## 2015-12-23 DIAGNOSIS — Y92009 Unspecified place in unspecified non-institutional (private) residence as the place of occurrence of the external cause: Secondary | ICD-10-CM | POA: Diagnosis not present

## 2015-12-23 DIAGNOSIS — Z87891 Personal history of nicotine dependence: Secondary | ICD-10-CM

## 2015-12-23 DIAGNOSIS — E1140 Type 2 diabetes mellitus with diabetic neuropathy, unspecified: Secondary | ICD-10-CM | POA: Diagnosis present

## 2015-12-23 DIAGNOSIS — Z9181 History of falling: Secondary | ICD-10-CM

## 2015-12-23 DIAGNOSIS — S92351K Displaced fracture of fifth metatarsal bone, right foot, subsequent encounter for fracture with nonunion: Secondary | ICD-10-CM

## 2015-12-23 DIAGNOSIS — Z5329 Procedure and treatment not carried out because of patient's decision for other reasons: Secondary | ICD-10-CM | POA: Diagnosis not present

## 2015-12-23 DIAGNOSIS — N179 Acute kidney failure, unspecified: Secondary | ICD-10-CM | POA: Diagnosis present

## 2015-12-23 DIAGNOSIS — E1165 Type 2 diabetes mellitus with hyperglycemia: Secondary | ICD-10-CM | POA: Diagnosis present

## 2015-12-23 DIAGNOSIS — E785 Hyperlipidemia, unspecified: Secondary | ICD-10-CM | POA: Diagnosis present

## 2015-12-23 DIAGNOSIS — S90415A Abrasion, left lesser toe(s), initial encounter: Secondary | ICD-10-CM | POA: Diagnosis present

## 2015-12-23 DIAGNOSIS — S92309A Fracture of unspecified metatarsal bone(s), unspecified foot, initial encounter for closed fracture: Secondary | ICD-10-CM | POA: Insufficient documentation

## 2015-12-23 DIAGNOSIS — N433 Hydrocele, unspecified: Secondary | ICD-10-CM | POA: Diagnosis present

## 2015-12-23 DIAGNOSIS — I5032 Chronic diastolic (congestive) heart failure: Secondary | ICD-10-CM | POA: Diagnosis present

## 2015-12-23 DIAGNOSIS — Z1211 Encounter for screening for malignant neoplasm of colon: Secondary | ICD-10-CM | POA: Diagnosis not present

## 2015-12-23 DIAGNOSIS — K5900 Constipation, unspecified: Secondary | ICD-10-CM | POA: Diagnosis present

## 2015-12-23 DIAGNOSIS — N170 Acute kidney failure with tubular necrosis: Secondary | ICD-10-CM | POA: Diagnosis present

## 2015-12-23 DIAGNOSIS — E875 Hyperkalemia: Secondary | ICD-10-CM | POA: Diagnosis present

## 2015-12-23 DIAGNOSIS — T796XXD Traumatic ischemia of muscle, subsequent encounter: Secondary | ICD-10-CM | POA: Diagnosis not present

## 2015-12-23 DIAGNOSIS — E860 Dehydration: Secondary | ICD-10-CM | POA: Diagnosis present

## 2015-12-23 DIAGNOSIS — K922 Gastrointestinal hemorrhage, unspecified: Secondary | ICD-10-CM | POA: Diagnosis present

## 2015-12-23 DIAGNOSIS — E1122 Type 2 diabetes mellitus with diabetic chronic kidney disease: Secondary | ICD-10-CM | POA: Diagnosis present

## 2015-12-23 DIAGNOSIS — S93326A Dislocation of tarsometatarsal joint of unspecified foot, initial encounter: Secondary | ICD-10-CM | POA: Insufficient documentation

## 2015-12-23 DIAGNOSIS — Z7901 Long term (current) use of anticoagulants: Secondary | ICD-10-CM

## 2015-12-23 DIAGNOSIS — I480 Paroxysmal atrial fibrillation: Secondary | ICD-10-CM | POA: Diagnosis present

## 2015-12-23 DIAGNOSIS — S92901S Unspecified fracture of right foot, sequela: Secondary | ICD-10-CM | POA: Diagnosis not present

## 2015-12-23 DIAGNOSIS — E1169 Type 2 diabetes mellitus with other specified complication: Secondary | ICD-10-CM | POA: Insufficient documentation

## 2015-12-23 DIAGNOSIS — D649 Anemia, unspecified: Secondary | ICD-10-CM | POA: Diagnosis not present

## 2015-12-23 DIAGNOSIS — E113299 Type 2 diabetes mellitus with mild nonproliferative diabetic retinopathy without macular edema, unspecified eye: Secondary | ICD-10-CM | POA: Insufficient documentation

## 2015-12-23 DIAGNOSIS — M6282 Rhabdomyolysis: Secondary | ICD-10-CM | POA: Insufficient documentation

## 2015-12-23 DIAGNOSIS — G8929 Other chronic pain: Secondary | ICD-10-CM | POA: Diagnosis present

## 2015-12-23 DIAGNOSIS — M5489 Other dorsalgia: Secondary | ICD-10-CM | POA: Diagnosis not present

## 2015-12-23 DIAGNOSIS — Z955 Presence of coronary angioplasty implant and graft: Secondary | ICD-10-CM | POA: Diagnosis not present

## 2015-12-23 DIAGNOSIS — S93324D Dislocation of tarsometatarsal joint of right foot, subsequent encounter: Secondary | ICD-10-CM | POA: Diagnosis not present

## 2015-12-23 DIAGNOSIS — L8990 Pressure ulcer of unspecified site, unspecified stage: Secondary | ICD-10-CM | POA: Insufficient documentation

## 2015-12-23 DIAGNOSIS — I130 Hypertensive heart and chronic kidney disease with heart failure and stage 1 through stage 4 chronic kidney disease, or unspecified chronic kidney disease: Secondary | ICD-10-CM | POA: Diagnosis present

## 2015-12-23 DIAGNOSIS — S92909A Unspecified fracture of unspecified foot, initial encounter for closed fracture: Secondary | ICD-10-CM | POA: Insufficient documentation

## 2015-12-23 DIAGNOSIS — I2510 Atherosclerotic heart disease of native coronary artery without angina pectoris: Secondary | ICD-10-CM | POA: Diagnosis present

## 2015-12-23 DIAGNOSIS — M549 Dorsalgia, unspecified: Secondary | ICD-10-CM | POA: Diagnosis present

## 2015-12-23 DIAGNOSIS — E119 Type 2 diabetes mellitus without complications: Secondary | ICD-10-CM | POA: Insufficient documentation

## 2015-12-23 LAB — CBC WITH DIFFERENTIAL/PLATELET
BASOS PCT: 0 %
Basophils Absolute: 0 10*3/uL (ref 0.0–0.1)
EOS ABS: 0.1 10*3/uL (ref 0.0–0.7)
Eosinophils Relative: 1 %
HEMATOCRIT: 32.9 % — AB (ref 39.0–52.0)
HEMOGLOBIN: 10.4 g/dL — AB (ref 13.0–17.0)
Lymphocytes Relative: 7 %
Lymphs Abs: 1.3 10*3/uL (ref 0.7–4.0)
MCH: 24.6 pg — AB (ref 26.0–34.0)
MCHC: 31.6 g/dL (ref 30.0–36.0)
MCV: 77.8 fL — AB (ref 78.0–100.0)
Monocytes Absolute: 1.6 10*3/uL — ABNORMAL HIGH (ref 0.1–1.0)
Monocytes Relative: 9 %
NEUTROS ABS: 14.3 10*3/uL — AB (ref 1.7–7.7)
NEUTROS PCT: 83 %
Platelets: 235 10*3/uL (ref 150–400)
RBC: 4.23 MIL/uL (ref 4.22–5.81)
RDW: 17.1 % — ABNORMAL HIGH (ref 11.5–15.5)
WBC: 17.3 10*3/uL — AB (ref 4.0–10.5)

## 2015-12-23 LAB — URINALYSIS, ROUTINE W REFLEX MICROSCOPIC
Bilirubin Urine: NEGATIVE
GLUCOSE, UA: 500 mg/dL — AB
Ketones, ur: 15 mg/dL — AB
LEUKOCYTES UA: NEGATIVE
Nitrite: NEGATIVE
Protein, ur: NEGATIVE mg/dL
SPECIFIC GRAVITY, URINE: 1.018 (ref 1.005–1.030)
pH: 5.5 (ref 5.0–8.0)

## 2015-12-23 LAB — URINE MICROSCOPIC-ADD ON

## 2015-12-23 LAB — COMPREHENSIVE METABOLIC PANEL
ALT: 20 U/L (ref 17–63)
ANION GAP: 15 (ref 5–15)
AST: 71 U/L — ABNORMAL HIGH (ref 15–41)
Albumin: 3.1 g/dL — ABNORMAL LOW (ref 3.5–5.0)
Alkaline Phosphatase: 53 U/L (ref 38–126)
BUN: 87 mg/dL — ABNORMAL HIGH (ref 6–20)
CALCIUM: 8.9 mg/dL (ref 8.9–10.3)
CHLORIDE: 95 mmol/L — AB (ref 101–111)
CO2: 23 mmol/L (ref 22–32)
CREATININE: 3.12 mg/dL — AB (ref 0.61–1.24)
GFR, EST AFRICAN AMERICAN: 23 mL/min — AB (ref >60)
GFR, EST NON AFRICAN AMERICAN: 20 mL/min — AB (ref >60)
Glucose, Bld: 445 mg/dL — ABNORMAL HIGH (ref 65–99)
Potassium: 6 mmol/L — ABNORMAL HIGH (ref 3.5–5.1)
SODIUM: 133 mmol/L — AB (ref 135–145)
Total Bilirubin: 1.7 mg/dL — ABNORMAL HIGH (ref 0.3–1.2)
Total Protein: 6.6 g/dL (ref 6.5–8.1)

## 2015-12-23 LAB — IRON AND TIBC
Iron: 45 ug/dL (ref 45–182)
SATURATION RATIOS: 15 % — AB (ref 17.9–39.5)
TIBC: 301 ug/dL (ref 250–450)
UIBC: 256 ug/dL

## 2015-12-23 LAB — GLUCOSE, CAPILLARY
GLUCOSE-CAPILLARY: 361 mg/dL — AB (ref 65–99)
Glucose-Capillary: 387 mg/dL — ABNORMAL HIGH (ref 65–99)

## 2015-12-23 LAB — POC OCCULT BLOOD, ED: FECAL OCCULT BLD: POSITIVE — AB

## 2015-12-23 LAB — CBG MONITORING, ED
Glucose-Capillary: 449 mg/dL — ABNORMAL HIGH (ref 65–99)
Glucose-Capillary: 485 mg/dL — ABNORMAL HIGH (ref 65–99)

## 2015-12-23 LAB — FERRITIN: Ferritin: 214 ng/mL (ref 24–336)

## 2015-12-23 LAB — CK: CK TOTAL: 3871 U/L — AB (ref 49–397)

## 2015-12-23 MED ORDER — SODIUM CHLORIDE 0.9% FLUSH
3.0000 mL | Freq: Two times a day (BID) | INTRAVENOUS | Status: DC
  Administered 2015-12-23 – 2015-12-30 (×10): 3 mL via INTRAVENOUS

## 2015-12-23 MED ORDER — INSULIN ASPART 100 UNIT/ML ~~LOC~~ SOLN
10.0000 [IU] | Freq: Once | SUBCUTANEOUS | Status: AC
  Administered 2015-12-23: 10 [IU] via SUBCUTANEOUS
  Filled 2015-12-23: qty 1

## 2015-12-23 MED ORDER — GABAPENTIN 300 MG PO CAPS
600.0000 mg | ORAL_CAPSULE | Freq: Two times a day (BID) | ORAL | Status: DC
  Administered 2015-12-23 – 2015-12-30 (×14): 600 mg via ORAL
  Filled 2015-12-23 (×14): qty 2

## 2015-12-23 MED ORDER — RIVAROXABAN 20 MG PO TABS
20.0000 mg | ORAL_TABLET | Freq: Every day | ORAL | Status: DC
  Administered 2015-12-23 – 2015-12-29 (×7): 20 mg via ORAL
  Filled 2015-12-23 (×7): qty 1

## 2015-12-23 MED ORDER — INSULIN ASPART 100 UNIT/ML ~~LOC~~ SOLN: 0.0000 [IU] | Freq: Three times a day (TID) | SUBCUTANEOUS | Status: DC

## 2015-12-23 MED ORDER — METOPROLOL TARTRATE 50 MG PO TABS
50.0000 mg | ORAL_TABLET | Freq: Two times a day (BID) | ORAL | Status: DC
  Administered 2015-12-23 – 2015-12-30 (×14): 50 mg via ORAL
  Filled 2015-12-23 (×14): qty 1

## 2015-12-23 MED ORDER — OXYCODONE HCL 5 MG PO TABS
20.0000 mg | ORAL_TABLET | Freq: Four times a day (QID) | ORAL | Status: DC | PRN
  Administered 2015-12-23 – 2015-12-30 (×23): 20 mg via ORAL
  Filled 2015-12-23 (×24): qty 4

## 2015-12-23 MED ORDER — POLYVINYL ALCOHOL 1.4 % OP SOLN
1.0000 [drp] | OPHTHALMIC | Status: DC | PRN
  Filled 2015-12-23: qty 15

## 2015-12-23 MED ORDER — FENTANYL CITRATE (PF) 100 MCG/2ML IJ SOLN
100.0000 ug | Freq: Once | INTRAMUSCULAR | Status: AC
  Administered 2015-12-23: 100 ug via INTRAVENOUS
  Filled 2015-12-23: qty 2

## 2015-12-23 MED ORDER — FENTANYL CITRATE (PF) 100 MCG/2ML IJ SOLN
50.0000 ug | INTRAMUSCULAR | Status: DC | PRN
  Administered 2015-12-24 – 2015-12-25 (×4): 50 ug via INTRAVENOUS
  Filled 2015-12-23 (×4): qty 2

## 2015-12-23 MED ORDER — SODIUM CHLORIDE 0.9 % IV SOLN
INTRAVENOUS | Status: AC
  Administered 2015-12-23: 150 mL/h via INTRAVENOUS

## 2015-12-23 MED ORDER — NATURAL BALANCE TEARS OP SOLN: 1.0000 [drp] | Freq: Every day | OPHTHALMIC | Status: DC | PRN

## 2015-12-23 MED ORDER — SODIUM CHLORIDE 0.9 % IV BOLUS (SEPSIS)
1000.0000 mL | Freq: Once | INTRAVENOUS | Status: AC
  Administered 2015-12-23: 1000 mL via INTRAVENOUS

## 2015-12-23 MED ORDER — ROSUVASTATIN CALCIUM 40 MG PO TABS
40.0000 mg | ORAL_TABLET | Freq: Every day | ORAL | Status: DC
  Administered 2015-12-23 – 2015-12-24 (×2): 40 mg via ORAL
  Filled 2015-12-23 (×2): qty 1

## 2015-12-23 MED ORDER — SODIUM CHLORIDE 0.9 % IV BOLUS (SEPSIS)
500.0000 mL | Freq: Once | INTRAVENOUS | Status: AC
  Administered 2015-12-23: 500 mL via INTRAVENOUS

## 2015-12-23 MED ORDER — INSULIN DETEMIR 100 UNIT/ML ~~LOC~~ SOLN
10.0000 [IU] | Freq: Every day | SUBCUTANEOUS | Status: DC
  Administered 2015-12-23: 10 [IU] via SUBCUTANEOUS
  Filled 2015-12-23 (×2): qty 0.1

## 2015-12-23 MED ORDER — AMIODARONE HCL 200 MG PO TABS
200.0000 mg | ORAL_TABLET | Freq: Every day | ORAL | Status: DC
  Administered 2015-12-23 – 2015-12-30 (×8): 200 mg via ORAL
  Filled 2015-12-23 (×8): qty 1

## 2015-12-23 MED ORDER — ACETAMINOPHEN 650 MG RE SUPP
650.0000 mg | Freq: Four times a day (QID) | RECTAL | Status: DC | PRN
Start: 2015-12-23 — End: 2015-12-30

## 2015-12-23 MED ORDER — POLYETHYLENE GLYCOL 3350 17 G PO PACK
17.0000 g | PACK | Freq: Every day | ORAL | Status: DC | PRN
  Administered 2015-12-24: 17 g via ORAL
  Filled 2015-12-23: qty 1

## 2015-12-23 MED ORDER — ACETAMINOPHEN 325 MG PO TABS
650.0000 mg | ORAL_TABLET | Freq: Four times a day (QID) | ORAL | Status: DC | PRN
Start: 2015-12-23 — End: 2015-12-30

## 2015-12-23 NOTE — ED Notes (Signed)
Pt rewturns from xray.

## 2015-12-23 NOTE — Care Management Note (Addendum)
Case Management Note  Patient Details  Name: [name] MRN: [id number] Date of Birth: 07-25-1950  Subjective/Objective:                  65 year old male with a history of morbid obesity, CHF, CAD, and paroxysmal A. fib presents from home by EMS after falling and being unable to get up. Patient states he's been having bilateral hip pain for the past 2 days./ From home with sister nearby.  Action/Plan: Follow for disposition needs.   Expected Discharge Date:  12/27/15               Expected Discharge Plan:  Home w Home Health Services  In-House Referral:  Clinical Social Work  Discharge planning Services  CM Consult  Post Acute Care Choice:    Choice offered to:     DME Arranged:    DME Agency:     HH Arranged:    HH Agency:     Status of Service:  In process, will continue to follow  Medicare Important Message Given:    Date Medicare IM Given:    Medicare IM give by:    Date Additional Medicare IM Given:    Additional Medicare Important Message give by:     If discussed at Long Length of Stay Meetings, dates discussed:    Additional Comments:  Oletta Cohn, RN 12/23/2015, 4:11 PM

## 2015-12-23 NOTE — ED Notes (Addendum)
Per EMS - pt from home. Pt getting up to restroom, pt fell in middle of night - states "legs just got tingly and gave out." Pt was on floor for 11 hours. C/o bilateral hip discomfort. Small abrasions to bilateral 1st and 2nd toes. Hx neuropathy.

## 2015-12-23 NOTE — H&P (Addendum)
Patient seen by me. I will cosign resident's note when ready. 65 Y/O M with Pmx of DM2, HLD, morbid obesity, HTN, OSA, CAD, HFpEF,CKD III, Afib presented with hx of fall more than 12 hours ago. He has long standing hx of back pain for which he is on opioid. This limits his mobility. While he was trying to get up and go to his bathroom he felt a pop in his leg and he fell face down. He denied head injury or LOC. He was on the floor for more than 12 hours before he finally called EMS who brought him in to the hospital. Currently he c/o leg and back pain about 9/10 in severity.  Med Past Medical History  Diagnosis Date  . Hypertension   . Chronic diastolic CHF (congestive heart failure) (HCC)   . CAD (coronary artery disease)     a. s/p PCI to RCA in 2012. b. prior cath in 01/2014 with elevated L/RH pressures, mild-mod CAD of LAD/LCx with patent RCA, normal EF, c/b CIN/CHF.  [name] PAF (paroxysmal atrial fibrillation) (HCC)     TEE DCCV 09/23/2014  . Anemia   . Pilonidal cyst 1980's; 01/25/2013  . Neuropathy (HCC)   . History of blood transfusion ~ 1954    "related to OR"  . Carpal tunnel syndrome, bilateral   . Morbid obesity (HCC)   . Hyperlipidemia   . OSA (obstructive sleep apnea)     "I wear nasal prongs; haven't been using prongs recently" (09/19/2014)  . Type II diabetes mellitus (HCC)   . Arthritis     "hands and lower back" (09/19/2014)  . Chronic lower back pain   . Chronic kidney disease (CKD), stage III (moderate)   . Cellulitis   . Hypoxia     a. Qualified for home O2 at [facility] in 09/2014.  [name] Physical deconditioning    Filed Vitals:   12/23/15 1241 12/23/15 1300 12/23/15 1503 12/23/15 1653  BP: 102/59 109/47 108/50 128/47  Pulse: 92 95 90 94  Temp: 98.1 F (36.7 C)     TempSrc: Oral     Resp: 20 15 15 20   SpO2: 95% 95% 95% 92%   Dg Thoracic Spine 2 View  12/23/2015  CLINICAL DATA:  Fall with mid thoracic and lower back pain. Initial encounter. EXAM: THORACIC SPINE 2 VIEWS  COMPARISON:  07/08/2015 chest x-ray FINDINGS: Bony detail is suboptimal due to patient size. No convincing fracture or subluxation. T1 vertebra incompletely visualized, but reportedly distant from the patient's mid and lower back pain. Multilevel spondylosis and degenerative disc narrowing, greatest at the lower thoracic levels. IMPRESSION: 1. No suspected fracture.  No subluxation. 2. Spondylosis and degenerative disc narrowing. 3. Sensitivity limited by patient size. Electronically Signed   By: 07/10/2015 M.D.   On: 12/23/2015 16:49   Dg Lumbar Spine Complete  12/23/2015  CLINICAL DATA:  Fall with lower back pain.  Initial encounter. EXAM: LUMBAR SPINE - COMPLETE 4+ VIEW COMPARISON:  07/11/2015 FINDINGS: No evidence of acute fracture or traumatic malalignment. Spondylosis. Mild to moderate L1-2 and L2-3 degenerative disc narrowing. Extensive arterial calcification. Bony detail diminished by patient size. IMPRESSION: No acute finding. Electronically Signed   By: 07/13/2015 M.D.   On: 12/23/2015 16:51   Dg Ankle Complete Left  12/23/2015  CLINICAL DATA:  Pt states his knees gave out yesterday evening, and he fell, pains both feet and ankles, and both hips, but due to pt weight of over 400 lbs, he was unable  to get up, and laid there for 11 hours, so he is stiff, unable to move much or hold his legs in position, he states he has neuropathy, so feeling in his feet is decreased, he states no pains, Either foot or ankle, pains laterally both hips at the greater trochanter, which was there before the fall EXAM: LEFT ANKLE COMPLETE - 3+ VIEW COMPARISON:  None. FINDINGS: No fracture.  The ankle mortise is normally spaced and aligned. Soft tissues are unremarkable other than arterial vascular calcifications. IMPRESSION: No fracture or dislocation.  No acute finding. Electronically Signed   By: Amie Portland M.D.   On: 12/23/2015 17:16   Dg Ankle Complete Right  12/23/2015  CLINICAL DATA:  Pt states his  knees gave out yesterday evening, and he fell, pains both feet and ankles, and both hips, but due to pt weight of over 400 lbs, he was unable to get up, and laid there for 11 hours, so he is stiff, unable to move much or hold his legs in position, he states he has neuropathy, so feeling in his feet is decreased, he states no pains, Either foot or ankle, pains laterally both hips at the greater trochanter, which was there before the fall EXAM: RIGHT ANKLE - COMPLETE 3+ VIEW COMPARISON:  None. FINDINGS: There is a fracture across the proximal metaphysis of fifth metatarsal. This appears to be a chronic ununited fracture. CAD is better visualized on the current right foot radiographs. No evidence of an ankle fracture. The ankle mortise is normally spaced and aligned. No significant arthropathic change. There are small calcaneal spurs. Calcifications noted in the ankle and foot arteries. IMPRESSION: 1. No ankle fracture dislocation. No significant ankle joint arthropathic change. 2. Fracture of the proximal fifth metatarsal metaphysis which appears to be a chronic ununited fracture. Electronically Signed   By: Amie Portland M.D.   On: 12/23/2015 14:28   Ct Head Wo Contrast  12/23/2015  CLINICAL DATA:  Status post fall today. The patient is anticoagulated. EXAM: CT HEAD WITHOUT CONTRAST TECHNIQUE: Contiguous axial images were obtained from the base of the skull through the vertex without intravenous contrast. COMPARISON:  None. FINDINGS: There is some cortical atrophy and chronic microvascular ischemic change. No evidence of acute intracranial abnormality including hemorrhage, infarct, mass lesion, mass effect, midline shift or abnormal extra-axial fluid collection is seen. No hydrocephalus or pneumocephalus. The calvarium is intact. Right mastoid effusion is identified. IMPRESSION: No acute abnormality. Mild atrophy and chronic microvascular ischemic change. Right mastoid effusion. Electronically Signed   By: Drusilla Kanner M.D.   On: 12/23/2015 14:40   Dg Foot Complete Left  12/23/2015  CLINICAL DATA:  The patient's knees gave way yesterday resulting in a fall and left foot injury. Pain. Initial encounter. EXAM: LEFT FOOT - COMPLETE 3+ VIEW COMPARISON:  None. FINDINGS: There is no evidence of fracture or dislocation. There is no evidence of arthropathy or other focal bone abnormality. Soft tissues demonstrate atherosclerosis. IMPRESSION: No acute abnormality. Atherosclerosis. Electronically Signed   By: Drusilla Kanner M.D.   On: 12/23/2015 14:28   Dg Foot Complete Right  12/23/2015  CLINICAL DATA:  Fall yesterday.  Pain EXAM: RIGHT FOOT COMPLETE - 3+ VIEW COMPARISON:  None. FINDINGS: Transverse fracture base of fifth metatarsal with sclerotic margins. This appears to be a chronic fracture without bony union. Correlate with history and symptoms in this area. Widening of the space between the first and second metatarsals and medial and middle cuneiform suggesting Lisfranc injury.  This may be chronic. Correlate with symptoms in this area. No acute fracture identified. Arterial calcification IMPRESSION: Chronic fracture of the fifth metatarsal without bony union Probable Lisfranc injury. This may be chronic. Correlate with symptoms in this area. Electronically Signed   By: Marlan Palau M.D.   On: 12/23/2015 14:27   Dg Hips Bilat With Pelvis 3-4 Views  12/23/2015  CLINICAL DATA:  Pt states his knees gave out yesterday evening, and he fell, pains both feet and ankles, and both hips, but due to pt weight of over 400 lbs, he was unable to get up, and laid there for 11 hours, so he is stiff, unable to move much or hold his legs in position, he states he has neuropathy, so feeling in his feet is decreased, he states no pains, Either foot or ankle, pains laterally both hips at the greater trochanter, which was there before the fall EXAM: DG HIP (WITH OR WITHOUT PELVIS) 3-4V BILAT COMPARISON:  None. FINDINGS: No fracture.   No bone lesion. Hip joints, SI joints and symphysis pubis are normally spaced and aligned. Soft tissues are unremarkable. IMPRESSION: 1. No fracture or dislocation.  No acute finding. Electronically Signed   By: Amie Portland M.D.   On: 12/23/2015 14:29    Exam: Gen: Appears morbidly obese, otherwise not in distress or mildly due to pain. HEENT: EOMI, PERRLA, unable to see his neck/short neck, no obvious head or facial trauma. Neuro: Awake and alert, oriented x 3 Resp: Not in respiratory distress. Heart: Inadequately assess. Resident's note reviewed below.(patient being transported to and fro imaging at this time). Will reassess tomorrow. Abd: Soft, obese/distended( likely base line). Ext/MSK: No edema, palpable dorsalis pedis. Some abrasion and dry blood on his feet mostly around the first digit/toe.   A/P: 65 Y/O M with 1. Fall and extremity and back pain: Imaging reviewed.     + Right proximal metatarsal fracture likely chronic with possible Lisfranc injury ( chronicity not specified). Will benefit from orthopedic assessment. ?? Need for CT scan to better assess fracture.     Spinal xray negative for fracture or dislocation.     If pain persists or worsens to obtain CT spine.     Pain control as needed. PT/OT in the A.M     Implement fall precaution.  2. AKI with Rhabdomyolysis: Likely due to prolonged lying down on the floor s/p fall.     S/P bolus IVF.     Currently on IVF at 150 cc/hr. May increase if no major improvement in Kidney function.     Monitor kidney function, recheck Bmet of CK.     Monitor urine output.  3. Electrolyte imbalance. Correct as appropriate.     Recheck K+ level in the AM.  4. Afib/HTN/CHF:    Monitor cardiac activity on telemetry. Continue home regimen for Afib. His rate is currently well controlled on Metoprolol. Also on anticoagulant chronically. Dosing per pharmacy.   Review home regimen for CHF and restart as appropriate. Monitor I&Os.   Monitor  V/S.  5. DM2: Serum glucose elevated on admission, normal anion gap. He likely did not take his insulin since he fell.     Review home regimen and adjust as needed.     SSI and serum glucose monitoring.

## 2015-12-23 NOTE — ED Notes (Signed)
CBG: [id number]

## 2015-12-23 NOTE — Progress Notes (Signed)
Pt. Refused cpap. 

## 2015-12-23 NOTE — ED Provider Notes (Signed)
CSN: [id number]     Arrival date & time 12/23/15  1229 History   First MD Initiated Contact with Patient 12/23/15 1253     No chief complaint on file.    (Consider location/radiation/quality/duration/timing/severity/associated sxs/prior Treatment) HPI  65 year old male with a history of morbid obesity, CHF, CAD, and paroxysmal A. fib presents from home by EMS after falling and being unable to get up. Patient states he's been having bilateral hip pain for the past 2 days. No trauma, woke up 2 days ago with pain. Since then has had trouble ambulating. He can walk but it is painful. He has been trying not to walk due to how painful it is. Last night in the middle the night he had to go the bathroom and his legs felt acutely weak and tingly and gave out on him. He was unable to get up. He thinks he injured his feet but cannot tell because he has no sensation due to neuropathy. He has seen some scrapes. Is not sure if he hit his head but does not think he lost consciousness. Has been lying on the ground for at least 11 hours until sister and EMS were able to get him up. Patient has been having one month history of upper extremity weakness, he exhibits this to why he could not get up. Otherwise has not had headaches, vomiting, diarrhea, chest pain, or shortness of breath.  Past Medical History  Diagnosis Date  . Hypertension   . Chronic diastolic CHF (congestive heart failure) (HCC)   . CAD (coronary artery disease)     a. s/p PCI to RCA in 2012. b. prior cath in 01/2014 with elevated L/RH pressures, mild-mod CAD of LAD/LCx with patent RCA, normal EF, c/b CIN/CHF.  [name] PAF (paroxysmal atrial fibrillation) (HCC)     TEE DCCV 09/23/2014  . Anemia   . Pilonidal cyst 1980's; 01/25/2013  . Neuropathy (HCC)   . History of blood transfusion ~ 1954    "related to OR"  . Carpal tunnel syndrome, bilateral   . Morbid obesity (HCC)   . Hyperlipidemia   . OSA (obstructive sleep apnea)     "I wear nasal prongs;  haven't been using prongs recently" (09/19/2014)  . Type II diabetes mellitus (HCC)   . Arthritis     "hands and lower back" (09/19/2014)  . Chronic lower back pain   . Chronic kidney disease (CKD), stage III (moderate)   . Cellulitis   . Hypoxia     a. Qualified for home O2 at [place] in 09/2014.  [name] Physical deconditioning    Past Surgical History  Procedure Laterality Date  . Abdominal surgery  ~ 1954    BENIGN TUMOR REMOVED  . Debridement  foot Left     debriding diabetic foot ulcers  . Cataract extraction w/phaco Right 11/15/2012    Procedure: CATARACT EXTRACTION PHACO AND INTRAOCULAR LENS PLACEMENT (IOC);  Surgeon: Shade Flood, MD;  Location: [facility] OR;  Service: Ophthalmology;  Laterality: Right;  . Cataract extraction w/phaco Left 11/29/2012    Procedure: CATARACT EXTRACTION PHACO AND INTRAOCULAR LENS PLACEMENT (IOC);  Surgeon: Shade Flood, MD;  Location: [facility] OR;  Service: Ophthalmology;  Laterality: Left;  . Pilonidal cyst excision N/A 01/08/2013    Procedure: CYST EXCISION PILONIDAL EXTENSIVE;  Surgeon: Axel Filler, MD;  Location: [facility] OR;  Service: General;  Laterality: N/A;  . Foreign body removal Right 2014    heel,  splinter removal   . Pars plana vitrectomy Left 06/05/2013  Procedure: PARS PLANA VITRECTOMY WITH 23 GAUGE with Endolaser(constellation);  Surgeon: Shade Flood, MD;  Location: [facility] OR;  Service: Ophthalmology;  Laterality: Left;  . Left and right heart catheterization with coronary angiogram N/A 01/31/2014    Procedure: LEFT AND RIGHT HEART CATHETERIZATION WITH CORONARY ANGIOGRAM;  Surgeon: Micheline Chapman, MD;  Location: [facility] CATH LAB;  Service: Cardiovascular;  Laterality: N/A;  . Tonsillectomy    . Appendectomy    . Eye surgery    . Coronary angioplasty with stent placement  August 2012    RCA DES - [facility]  . Cardioversion  2010    [name] 09/19/2014  . Pilonidal cyst excision  1980's    "in [place]"  . Tee without cardioversion N/A  09/23/2014    Procedure: TRANSESOPHAGEAL ECHOCARDIOGRAM (TEE);  Surgeon: Lewayne Bunting, MD;  Location: [facility] ENDOSCOPY;  Service: Cardiovascular;  Laterality: N/A;  . Cardioversion N/A 09/23/2014    Procedure: CARDIOVERSION;  Surgeon: Lewayne Bunting, MD;  Location: [facility] ENDOSCOPY;  Service: Cardiovascular;  Laterality: N/A;  . Cardioversion N/A 12/11/2014    Procedure: CARDIOVERSION;  Surgeon: Thurmon Fair, MD;  Location: [facility] ENDOSCOPY;  Service: Cardiovascular;  Laterality: N/A;   Family History  Problem Relation Age of Onset  . Adopted: Yes  . Other Other     PT ADOPTED   Social History  Substance Use Topics  . Smoking status: Former Smoker -- 1.00 packs/day for 20 years    Types: Cigarettes    Quit date: 07/13/1983  . Smokeless tobacco: Never Used  . Alcohol Use: No    Review of Systems  Constitutional: Negative for fever.  Respiratory: Negative for shortness of breath.   Cardiovascular: Negative for chest pain.  Gastrointestinal: Negative for vomiting, abdominal pain and diarrhea.  Musculoskeletal: Positive for arthralgias. Negative for back pain.  Neurological: Positive for weakness and numbness. Negative for syncope and headaches.  All other systems reviewed and are negative.     Allergies  Review of patient's allergies indicates no known allergies.  Home Medications   Prior to Admission medications   Medication Sig Start Date End Date Taking? Authorizing Provider  amiodarone (PACERONE) 200 MG tablet Take 200 mg by mouth daily. 08/29/15   Historical Provider, MD  furosemide (LASIX) 80 MG tablet Take 1 tablet (80 mg total) by mouth 2 (two) times daily. 11/20/15   Dwana Melena, PA-C  gabapentin (NEURONTIN) 300 MG capsule Take 2 capsules (600 mg total) by mouth 2 (two) times daily. 09/15/15   Richarda Overlie, MD  Hypromellose (NATURAL BALANCE TEARS OP) Place 1 drop into both eyes daily as needed (for dry eyes).     Historical Provider, MD  Insulin Detemir (LEVEMIR FLEXTOUCH)  100 UNIT/ML Pen Inject 20 Units into the skin daily. 07/10/15   [name], MD  insulin regular human CONCENTRATED (HUMULIN R) 500 UNIT/ML injection Inject 0.04 mLs (20 Units total) into the skin 3 (three) times daily before meals. 11/11/15   [name], MD  lisinopril (PRINIVIL,ZESTRIL) 10 MG tablet Take 1 tablet (10 mg total) by mouth daily. 09/29/15   Richarda Overlie, MD  metolazone (ZAROXOLYN) 5 MG tablet Take 1 tablet (5 mg total) by mouth daily. TAKE 30 MINUTES BEFORE TAKING LASIX 11/20/15   Dwana Melena, PA-C  metoprolol (LOPRESSOR) 50 MG tablet Take 50 mg by mouth 2 (two) times daily. TWO TABLETS TWICE A DAY    Historical Provider, MD  Multiple Vitamin (MULTIVITAMIN WITH MINERALS) TABS Take 1 tablet  by mouth daily.    Historical Provider, MD  oxyCODONE 10 MG TABS Take 1 tablet (10 mg total) by mouth every 6 (six) hours as needed for severe pain. Patient taking differently: Take 20 mg by mouth every 6 (six) hours as needed for severe pain.  09/15/15   Richarda Overlie, MD  Oxycodone HCl 20 MG TABS Take 1 tablet by mouth every 6 (six) hours as needed. for pain 11/13/15   Historical Provider, MD  potassium chloride SA (KLOR-CON M20) 20 MEQ tablet Take 1 tablet (20 mEq total) by mouth daily. 10/15/15   [name], MD  rosuvastatin (CRESTOR) 40 MG tablet Take 40 mg by mouth daily.    Historical Provider, MD  XARELTO 20 MG TABS tablet TAKE 1 TABLET (20 MG TOTAL) BY MOUTH DAILY WITH SUPPER. 09/15/15   Lewayne Bunting, MD   BP 102/59 mmHg  Pulse 92  Temp(Src) 98.1 F (36.7 C) (Oral)  Resp 20  SpO2 95% Physical Exam  Constitutional: He is oriented to person, place, and time. He appears well-developed and well-nourished.  Morbidly obese, no acute distress  HENT:  Head: Normocephalic and atraumatic.  Right Ear: External ear normal.  Left Ear: External ear normal.  Nose: Nose normal.  Mouth/Throat: Mucous membranes are dry. No oropharyngeal exudate.  Eyes: EOM are normal. Pupils are equal,  round, and reactive to light. Right eye exhibits no discharge. Left eye exhibits no discharge.  Neck: Neck supple.  Cardiovascular: Normal rate, regular rhythm, normal heart sounds and intact distal pulses.   Pulmonary/Chest: Effort normal and breath sounds normal.  Abdominal: Soft. He exhibits no distension. There is no tenderness.  Musculoskeletal: He exhibits no edema.       Right hip: He exhibits tenderness (lateral).       Left hip: He exhibits tenderness (lateral, mild).       Right ankle: He exhibits normal range of motion.       Left ankle: He exhibits normal range of motion.  Multiple small superficial abrasions to bilateral toes  Neurological: He is alert and oriented to person, place, and time.  CN 3-12 grossly intact. 5/5 strength in UE bilaterally. 5/5 strength in lower extremities bilaterally but seems a little weaker.  Skin: Skin is warm and dry.  Nursing note and vitals reviewed.   ED Course  Procedures (including critical care time) Labs Review Labs Reviewed  COMPREHENSIVE METABOLIC PANEL - Abnormal; Notable for the following:    Sodium 133 (*)    Potassium 6.0 (*)    Chloride 95 (*)    Glucose, Bld 445 (*)    BUN 87 (*)    Creatinine, Ser 3.12 (*)    Albumin 3.1 (*)    AST 71 (*)    Total Bilirubin 1.7 (*)    GFR calc non Af Amer 20 (*)    GFR calc Af Amer 23 (*)    All other components within normal limits  CBC WITH DIFFERENTIAL/PLATELET - Abnormal; Notable for the following:    WBC 17.3 (*)    Hemoglobin 10.4 (*)    HCT 32.9 (*)    MCV 77.8 (*)    MCH 24.6 (*)    RDW 17.1 (*)    Neutro Abs 14.3 (*)    Monocytes Absolute 1.6 (*)    All other components within normal limits  URINALYSIS, ROUTINE W REFLEX MICROSCOPIC (NOT AT [facility]) - Abnormal; Notable for the following:    Glucose, UA 500 (*)  Hgb urine dipstick MODERATE (*)    Ketones, ur 15 (*)    All other components within normal limits  CK - Abnormal; Notable for the following:    Total CK 3871  (*)    All other components within normal limits  URINE MICROSCOPIC-ADD ON - Abnormal; Notable for the following:    Squamous Epithelial / LPF 0-5 (*)    Bacteria, UA RARE (*)    All other components within normal limits  CBG MONITORING, ED - Abnormal; Notable for the following:    Glucose-Capillary 449 (*)    All other components within normal limits  POC OCCULT BLOOD, ED    Imaging Review Dg Ankle Complete Right  12/23/2015  CLINICAL DATA:  Pt states his knees gave out yesterday evening, and he fell, pains both feet and ankles, and both hips, but due to pt weight of over 400 lbs, he was unable to get up, and laid there for 11 hours, so he is stiff, unable to move much or hold his legs in position, he states he has neuropathy, so feeling in his feet is decreased, he states no pains, Either foot or ankle, pains laterally both hips at the greater trochanter, which was there before the fall EXAM: RIGHT ANKLE - COMPLETE 3+ VIEW COMPARISON:  None. FINDINGS: There is a fracture across the proximal metaphysis of fifth metatarsal. This appears to be a chronic ununited fracture. CAD is better visualized on the current right foot radiographs. No evidence of an ankle fracture. The ankle mortise is normally spaced and aligned. No significant arthropathic change. There are small calcaneal spurs. Calcifications noted in the ankle and foot arteries. IMPRESSION: 1. No ankle fracture dislocation. No significant ankle joint arthropathic change. 2. Fracture of the proximal fifth metatarsal metaphysis which appears to be a chronic ununited fracture. Electronically Signed   By: Amie Portland M.D.   On: 12/23/2015 14:28   Ct Head Wo Contrast  12/23/2015  CLINICAL DATA:  Status post fall today. The patient is anticoagulated. EXAM: CT HEAD WITHOUT CONTRAST TECHNIQUE: Contiguous axial images were obtained from the base of the skull through the vertex without intravenous contrast. COMPARISON:  None. FINDINGS: There is some  cortical atrophy and chronic microvascular ischemic change. No evidence of acute intracranial abnormality including hemorrhage, infarct, mass lesion, mass effect, midline shift or abnormal extra-axial fluid collection is seen. No hydrocephalus or pneumocephalus. The calvarium is intact. Right mastoid effusion is identified. IMPRESSION: No acute abnormality. Mild atrophy and chronic microvascular ischemic change. Right mastoid effusion. Electronically Signed   By: Drusilla Kanner M.D.   On: 12/23/2015 14:40   Dg Foot Complete Left  12/23/2015  CLINICAL DATA:  The patient's knees gave way yesterday resulting in a fall and left foot injury. Pain. Initial encounter. EXAM: LEFT FOOT - COMPLETE 3+ VIEW COMPARISON:  None. FINDINGS: There is no evidence of fracture or dislocation. There is no evidence of arthropathy or other focal bone abnormality. Soft tissues demonstrate atherosclerosis. IMPRESSION: No acute abnormality. Atherosclerosis. Electronically Signed   By: Drusilla Kanner M.D.   On: 12/23/2015 14:28   Dg Foot Complete Right  12/23/2015  CLINICAL DATA:  Fall yesterday.  Pain EXAM: RIGHT FOOT COMPLETE - 3+ VIEW COMPARISON:  None. FINDINGS: Transverse fracture base of fifth metatarsal with sclerotic margins. This appears to be a chronic fracture without bony union. Correlate with history and symptoms in this area. Widening of the space between the first and second metatarsals and medial and middle cuneiform suggesting  Lisfranc injury. This may be chronic. Correlate with symptoms in this area. No acute fracture identified. Arterial calcification IMPRESSION: Chronic fracture of the fifth metatarsal without bony union Probable Lisfranc injury. This may be chronic. Correlate with symptoms in this area. Electronically Signed   By: Marlan Palau M.D.   On: 12/23/2015 14:27   Dg Hips Bilat With Pelvis 3-4 Views  12/23/2015  CLINICAL DATA:  Pt states his knees gave out yesterday evening, and he fell, pains both  feet and ankles, and both hips, but due to pt weight of over 400 lbs, he was unable to get up, and laid there for 11 hours, so he is stiff, unable to move much or hold his legs in position, he states he has neuropathy, so feeling in his feet is decreased, he states no pains, Either foot or ankle, pains laterally both hips at the greater trochanter, which was there before the fall EXAM: DG HIP (WITH OR WITHOUT PELVIS) 3-4V BILAT COMPARISON:  None. FINDINGS: No fracture.  No bone lesion. Hip joints, SI joints and symphysis pubis are normally spaced and aligned. Soft tissues are unremarkable. IMPRESSION: 1. No fracture or dislocation.  No acute finding. Electronically Signed   By: Amie Portland M.D.   On: 12/23/2015 14:29   I have personally reviewed and evaluated these images and lab results as part of my medical decision-making.   EKG Interpretation   Date/Time:  Tuesday December 23 2015 14:56:53 EDT Ventricular Rate:  87 PR Interval:    QRS Duration: 120 QT Interval:  387 QTC Calculation: 466 R Axis:   -45 Text Interpretation:  Atrial fibrillation Incomplete left bundle branch  block Consider anterior infarct no significant change since Dec 2016  Confirmed by Criss Alvine MD, Shakenya Stoneberg [phone]) on 12/23/2015 3:00:02 PM      MDM   Final diagnoses:  Acute kidney injury (HCC)  Fall, initial encounter    Patient has significant acute kidney injury noted on blood work. Likely prerenal with very high BUN. He is not altered. CT head obtained due to fall on Xarelto but this shows no obvious pathology. Patient given IV fluids. He has a history of CHF but appears clinically dehydrated. Moderately elevated CK likely from early muscle breakdown while laying on the floor for 11 hours. No obvious etiology for his bilateral lateral hip pain. X-rays are unremarkable. On his feet, he has some small abrasions, likely from the fall and injury. Has a possible Lisfranc injury of his right foot. Went back and palpated again  and there does not seem to be any significant tenderness in his midfoot. However he does have significant neuropathy and numbness. Will likely need or so consult. Family practice to admit for acute kidney injury, fluids, and monitoring.    Pricilla Loveless, MD 12/23/15 [phone]

## 2015-12-23 NOTE — ED Notes (Signed)
Gave PT water per [facility] RN

## 2015-12-23 NOTE — ED Notes (Signed)
Pt taking po fluids and tolerating well. 

## 2015-12-23 NOTE — H&P (Signed)
Family Medicine Teaching [facility] Admission History and Physical Service Pager: [phone]  Patient name: [name] Medical record number: [id number] Date of birth: [birth year] Age: 65 y.o. Gender: male  Primary Care Provider: [name], MD Consultants: None Code Status: DNR  Chief Complaint: Back pain, hip pain after fall  Assessment and Plan: [name] is a 65 y.o. male presenting with back pain after fall. PMH is significant for chronic diastolic CHF, CAD (s/p PCI to RCA in 2012, prior cath in 01/2014 with elevated L/RH pressures, mild-mod CAD of LAD/LCx with patent RCA, normal EF, c/b CIN/CHF), morbid obesity, paroxysmal atrial fibrillation, OSA (unable to tolerate CPAP), DM, HLD, anemia, CKD stage III, deconditioning.   #Back pain after fall: History of falls, with an admission in December 2016 in setting of afib with RVR. Does have history of chronic back pain and LE neuropathy with numbness. Believe neuropathy contributes to reecurrent falls as patient unable to feel his feet on exam. Imaging unremarkable except for right proximal metatarsal fracture likely chronic with possible Lisfranc injury. -admit to telemetry, Attending Dr. Lum Babe -obtain lumbar and thoracic spine xrays -ordered home oxycodone 20 mg q6h prn  -fentanyl 50 mg q4h prn -PT/OT consult -ortho consult in AM -fall precautions  #Rhabodomyolysis. Due to fall with prolonged lie. CK 3871. Denies muscle aches. Moderate hemoglobin on UA.  -s/p 2L fluid in ED -trend CK -IV hydration with NS @ 150 mL/hr  #AoCKD: SCr 3.12 on admission. Baseline SCr ~1.5. Likely ATN in setting of rhabdomyolysis. Pre-renal contribution as well with BUN of 87.  -repeat CMP in the a.m. -avoid nephrotoxic agents -providing fluid resuscitation -I/Os  #Hyperkalemia: EKG without peaked T waves, though low voltage due to patient habitus.  -hold potassium chloride supplement -insulin regimen ordered -repeat EKG in the  morning -monitor on telemetry -recheck labs in AM  #T2DM. A1c 10/2015 8.7. Takes Levemir 20U daily and Humalin 20U TID with meals. CBGs elevated to 450 in the ED. Anion gap of 15. Ketones on UA but in setting of dehydration. Monitor closely for development of DKA.  -q2h CBGs while sugars >400 -Levemir 10U nightly -SSI, moderate scale (may need to increase to resistant due to obesity) -Transition to qAC/HS CBGs  #CHF. Echo 11/2014 EF 50-55% but with LVH. Treatment aimed at controlling LE edema and dyspnea on exertion. No signs of fluid overload on exam (no LE edema, lung CTAB).  -hold lasix 80 mg BID for AKI -hold metolazone 5 mg daily for AKI -continue metoprolol 100 mg BID -Monitor fluid status. -daily weights  #Concern for Lisfranc fracture on right foot Xray.  -consult orthopedic surgery in the a.m.  #Paroxysmal A.fib: In afib on exam. HR <100.  -continue Xarelto  -continue amiodarone 200 mg daily and metoprolol 100 mg BID  #HTN: Soft pressures on admission.  -continue home metoprolol -hold lisinopril in setting of AKI  #CAD/HLD -continue crestor 40 mg daily -on xarelto  #Leukocytosis: To 17.3 on admission. Afebrile on presentation.  -monitor with daily CBCs  #OSA.  -Uses 2L of O2 at home -CPAP ordered for hospitalization  #Microcytic anemia: Stable at 10.4. Baseline ~ 10.  -Obtain iron studies.   FEN/GI: Heart Healthy/Carb Modified Diet, IVFs NS @ 150 mL/hr Prophylaxis: on xarelto  Disposition: Admit for pain control and assessment of mobility.   History of Present Illness:  [name] is a 65 y.o. male presenting with back pain after a fall. He reports that he has been having pain in  both hips for the past few days, and on his way to the bathroom, his legs buckled underneath him. He says he fell backwards/straight down and does not believe he hit his head or had loss of consciousness. He denies dizziness or presyncopal symptoms. He reports being on the floor for  12 hours until he was able to make it to a phone to call for help. He now has severe, sharp pain in his back that radiates down the back of his right leg behind his knee. He denies muscle cramping. He denies chest pain and shortness of breath. He does use 2L O2 at home as needed with exertion. He does report chronic back pain for which he takes 20 mg oxycodone 3 times a day.   Otherwise, he denies nausea, vomiting, and diarrhea. He says he is currently constipated and has not had a bowel movement in a couple of days. In addition, his sugars have been high recently in the 250-300 range with checks about 3 times a day. He denies trouble taking his insulin. His last dose was prior to his fall yesterday night.   Review Of Systems: Per HPI with the following additions: Pt notes his abdomen is sticking out more than it normally does.  Otherwise the remainder of the systems were negative.  Patient Active Problem List   Diagnosis Date Noted  . Acute kidney injury (HCC) 12/23/2015  . Hyperglycemia   . Sepsis (HCC) 09/08/2015  . Scrotal wall abscess 09/08/2015  . AKI (acute kidney injury) (HCC) 09/08/2015  . Chest wall pain   . On amiodarone therapy 12/08/2014  . Respiratory failure with hypoxia (HCC) 12/04/2014  . Hypoxia 12/04/2014  . Acute on chronic respiratory failure with hypoxia (HCC)   . Atrial fibrillation with RVR (HCC) 09/20/2014  . Acute on chronic diastolic heart failure (HCC) 09/19/2014  . Atrial fibrillation with rapid ventricular response (HCC) 09/19/2014  . Acute respiratory failure (HCC) 09/19/2014  . Abnormal nuclear cardiac imaging test 02/12/2014  . Dyspnea 02/12/2014  . Chronic anticoagulation- Coumadin 02/12/2014  . CKD (chronic kidney disease) stage 3, GFR 30-59 ml/min 02/12/2014  . Chronic diastolic congestive heart failure (HCC) 02/11/2014  . Encounter for therapeutic drug monitoring 08/03/2013  . Pilonidal cyst 01/25/2013  . Perirectal cellulitis 09/06/2012  . Long  term (current) use of anticoagulants 11/18/2011  . Obstructive sleep apnea- unable to tolerate c-pap 08/04/2011  . Coronary Artery Disease 08/04/2011  . Diabetes mellitus type 2, uncontrolled, with complications (HCC) 01/06/2009  . ANEMIA-IRON DEFICIENCY 01/06/2009  . OTHER AND UNSPECIFIED COAGULATION DEFECTS 01/06/2009  . RECTAL BLEEDING 01/06/2009  . THYROID STIMULATING HORMONE, ABNORMAL 01/02/2009  . ANEMIA 01/02/2009  . CARDIOVASCULAR FUNCTION STUDY, ABNORMAL 01/02/2009  . Essential hypertension, benign 10/22/2008  . Atrial fibrillation- DCCV 2010 10/22/2008  . DIAB W/UNSPEC COMP TYPE II/UNSPEC TYPE UNCNTRL 09/23/2008  . Hyperlipidemia 09/23/2008  . HYPOKALEMIA 09/23/2008  . OBESITY-MORBID BMI 54 09/23/2008  . CELLULITIS AND ABSCESS OF LEG EXCEPT FOOT 09/23/2008    Past Medical History: Past Medical History  Diagnosis Date  . Hypertension   . Chronic diastolic CHF (congestive heart failure) (HCC)   . CAD (coronary artery disease)     a. s/p PCI to RCA in 2012. b. prior cath in 01/2014 with elevated L/RH pressures, mild-mod CAD of LAD/LCx with patent RCA, normal EF, c/b CIN/CHF.  [name] PAF (paroxysmal atrial fibrillation) (HCC)     TEE DCCV 09/23/2014  . Anemia   . Pilonidal cyst 1980's; 01/25/2013  . Neuropathy (  HCC)   . History of blood transfusion ~ 1954    "related to OR"  . Carpal tunnel syndrome, bilateral   . Morbid obesity (HCC)   . Hyperlipidemia   . OSA (obstructive sleep apnea)     "I wear nasal prongs; haven't been using prongs recently" (09/19/2014)  . Type II diabetes mellitus (HCC)   . Arthritis     "hands and lower back" (09/19/2014)  . Chronic lower back pain   . Chronic kidney disease (CKD), stage III (moderate)   . Cellulitis   . Hypoxia     a. Qualified for home O2 at [facility] in 09/2014.  [name] Physical deconditioning     Past Surgical History: Past Surgical History  Procedure Laterality Date  . Abdominal surgery  ~ 1954    BENIGN TUMOR REMOVED  . Debridement   foot Left     debriding diabetic foot ulcers  . Cataract extraction w/phaco Right 11/15/2012    Procedure: CATARACT EXTRACTION PHACO AND INTRAOCULAR LENS PLACEMENT (IOC);  Surgeon: Shade Flood, MD;  Location: [facility] OR;  Service: Ophthalmology;  Laterality: Right;  . Cataract extraction w/phaco Left 11/29/2012    Procedure: CATARACT EXTRACTION PHACO AND INTRAOCULAR LENS PLACEMENT (IOC);  Surgeon: Shade Flood, MD;  Location: [facility] OR;  Service: Ophthalmology;  Laterality: Left;  . Pilonidal cyst excision N/A 01/08/2013    Procedure: CYST EXCISION PILONIDAL EXTENSIVE;  Surgeon: Axel Filler, MD;  Location: [facility] OR;  Service: General;  Laterality: N/A;  . Foreign body removal Right 2014    heel,  splinter removal   . Pars plana vitrectomy Left 06/05/2013    Procedure: PARS PLANA VITRECTOMY WITH 23 GAUGE with Endolaser(constellation);  Surgeon: Shade Flood, MD;  Location: [facility] OR;  Service: Ophthalmology;  Laterality: Left;  . Left and right heart catheterization with coronary angiogram N/A 01/31/2014    Procedure: LEFT AND RIGHT HEART CATHETERIZATION WITH CORONARY ANGIOGRAM;  Surgeon: Micheline Chapman, MD;  Location: [facility] CATH LAB;  Service: Cardiovascular;  Laterality: N/A;  . Tonsillectomy    . Appendectomy    . Eye surgery    . Coronary angioplasty with stent placement  August 2012    RCA DES - [facility]  . Cardioversion  2010    [name] 09/19/2014  . Pilonidal cyst excision  1980's    "in [place]"  . Tee without cardioversion N/A 09/23/2014    Procedure: TRANSESOPHAGEAL ECHOCARDIOGRAM (TEE);  Surgeon: Lewayne Bunting, MD;  Location: [facility] ENDOSCOPY;  Service: Cardiovascular;  Laterality: N/A;  . Cardioversion N/A 09/23/2014    Procedure: CARDIOVERSION;  Surgeon: Lewayne Bunting, MD;  Location: [facility] ENDOSCOPY;  Service: Cardiovascular;  Laterality: N/A;  . Cardioversion N/A 12/11/2014    Procedure: CARDIOVERSION;  Surgeon: Thurmon Fair, MD;  Location: [facility] ENDOSCOPY;  Service:  Cardiovascular;  Laterality: N/A;    Social History: Social History  Substance Use Topics  . Smoking status: Former Smoker -- 1.00 packs/day for 20 years    Types: Cigarettes    Quit date: 07/13/1983  . Smokeless tobacco: Never Used  . Alcohol Use: No   Additional social history: Lives with sister.  Please also refer to relevant sections of EMR.  Family History: Family History  Problem Relation Age of Onset  . Adopted: Yes  . Other Other     PT ADOPTED   Allergies and Medications: No Known Allergies No current facility-administered medications on file prior to encounter.   Current Outpatient Prescriptions on File Prior to Encounter  Medication Sig Dispense Refill  . amiodarone (PACERONE) 200 MG tablet Take 200 mg by mouth daily.    . furosemide (LASIX) 80 MG tablet Take 1 tablet (80 mg total) by mouth 2 (two) times daily. 180 tablet 3  . gabapentin (NEURONTIN) 300 MG capsule Take 2 capsules (600 mg total) by mouth 2 (two) times daily. 60 capsule 0  . Hypromellose (NATURAL BALANCE TEARS OP) Place 1 drop into both eyes daily as needed (for dry eyes).     . Insulin Detemir (LEVEMIR FLEXTOUCH) 100 UNIT/ML Pen Inject 20 Units into the skin daily. 15 mL 11  . insulin regular human CONCENTRATED (HUMULIN R) 500 UNIT/ML injection Inject 0.04 mLs (20 Units total) into the skin 3 (three) times daily before meals. 20 mL 11  . lisinopril (PRINIVIL,ZESTRIL) 10 MG tablet Take 1 tablet (10 mg total) by mouth daily. 90 tablet 2  . metolazone (ZAROXOLYN) 5 MG tablet Take 1 tablet (5 mg total) by mouth daily. TAKE 30 MINUTES BEFORE TAKING LASIX 30 tablet 3  . metoprolol (LOPRESSOR) 50 MG tablet Take 50 mg by mouth 2 (two) times daily. TWO TABLETS TWICE A DAY    . Multiple Vitamin (MULTIVITAMIN WITH MINERALS) TABS Take 1 tablet by mouth daily.    [name] oxyCODONE 10 MG TABS Take 1 tablet (10 mg total) by mouth every 6 (six) hours as needed for severe pain. (Patient taking differently: Take 20 mg by  mouth every 6 (six) hours as needed for severe pain. ) 30 tablet 0  . potassium chloride SA (KLOR-CON M20) 20 MEQ tablet Take 1 tablet (20 mEq total) by mouth daily. 60 tablet 6  . rosuvastatin (CRESTOR) 40 MG tablet Take 40 mg by mouth daily.    Carlena Hurl 20 MG TABS tablet TAKE 1 TABLET (20 MG TOTAL) BY MOUTH DAILY WITH SUPPER. 30 tablet 3    Objective: BP 108/50 mmHg  Pulse 90  Temp(Src) 98.1 F (36.7 C) (Oral)  Resp 15  SpO2 95% Exam: General: Morbidly obese male, lying in bed, in mild distress Eyes: PERRLA, EOMI ENTM: MM tacky, oropharynx normal Neck: Supple, could not assess for JVD due to habitus Cardiovascular: Irregularly irregular, S1, S2, no m/r/g Respiratory: CTAB, moving air well throughout  Abdomen: +BS, soft, ND, mild TTP over LLQ, [place] in place MSK: LE strength 4-/5 on the left and 3+/5 on the right. He can wiggle his toes. Abrasions across both feet. No LE edema.  Skin: Very dry skin across feet, bruised toenails.  Neuro: AOx3. Decreased sensation to touch to ankle of R LE and to mid-shin to L LE.  Psych: Appropriate mood and affect.   Labs and Imaging: Results for orders placed or performed during the hospital encounter of 12/23/15 (from the past 24 hour(s))  Comprehensive metabolic panel     Status: Abnormal   Collection Time: 12/23/15  1:10 PM  Result Value Ref Range   Sodium 133 (L) 135 - 145 mmol/L   Potassium 6.0 (H) 3.5 - 5.1 mmol/L   Chloride 95 (L) 101 - 111 mmol/L   CO2 23 22 - 32 mmol/L   Glucose, Bld 445 (H) 65 - 99 mg/dL   BUN 87 (H) 6 - 20 mg/dL   Creatinine, Ser 4.16 (H) 0.61 - 1.24 mg/dL   Calcium 8.9 8.9 - 60.6 mg/dL   Total Protein 6.6 6.5 - 8.1 g/dL   Albumin 3.1 (L) 3.5 - 5.0 g/dL   AST 71 (H) 15 - 41 U/L   ALT  20 17 - 63 U/L   Alkaline Phosphatase 53 38 - 126 U/L   Total Bilirubin 1.7 (H) 0.3 - 1.2 mg/dL   GFR calc non Af Amer 20 (L) >60 mL/min   GFR calc Af Amer 23 (L) >60 mL/min   Anion gap 15 5 - 15  CBC with Differential      Status: Abnormal   Collection Time: 12/23/15  1:10 PM  Result Value Ref Range   WBC 17.3 (H) 4.0 - 10.5 K/uL   RBC 4.23 4.22 - 5.81 MIL/uL   Hemoglobin 10.4 (L) 13.0 - 17.0 g/dL   HCT 52.8 (L) 41.3 - 24.4 %   MCV 77.8 (L) 78.0 - 100.0 fL   MCH 24.6 (L) 26.0 - 34.0 pg   MCHC 31.6 30.0 - 36.0 g/dL   RDW 01.0 (H) 27.2 - 53.6 %   Platelets 235 150 - 400 K/uL   Neutrophils Relative % 83 %   Neutro Abs 14.3 (H) 1.7 - 7.7 K/uL   Lymphocytes Relative 7 %   Lymphs Abs 1.3 0.7 - 4.0 K/uL   Monocytes Relative 9 %   Monocytes Absolute 1.6 (H) 0.1 - 1.0 K/uL   Eosinophils Relative 1 %   Eosinophils Absolute 0.1 0.0 - 0.7 K/uL   Basophils Relative 0 %   Basophils Absolute 0.0 0.0 - 0.1 K/uL  CK     Status: Abnormal   Collection Time: 12/23/15  1:10 PM  Result Value Ref Range   Total CK 3871 (H) 49 - 397 U/L  CBG monitoring, ED     Status: Abnormal   Collection Time: 12/23/15  1:25 PM  Result Value Ref Range   Glucose-Capillary 449 (H) 65 - 99 mg/dL  Urinalysis, Routine w reflex microscopic     Status: Abnormal   Collection Time: 12/23/15  2:35 PM  Result Value Ref Range   Color, Urine YELLOW YELLOW   APPearance CLEAR CLEAR   Specific Gravity, Urine 1.018 1.005 - 1.030   pH 5.5 5.0 - 8.0   Glucose, UA 500 (A) NEGATIVE mg/dL   Hgb urine dipstick MODERATE (A) NEGATIVE   Bilirubin Urine NEGATIVE NEGATIVE   Ketones, ur 15 (A) NEGATIVE mg/dL   Protein, ur NEGATIVE NEGATIVE mg/dL   Nitrite NEGATIVE NEGATIVE   Leukocytes, UA NEGATIVE NEGATIVE  Urine microscopic-add on     Status: Abnormal   Collection Time: 12/23/15  2:35 PM  Result Value Ref Range   Squamous Epithelial / LPF 0-5 (A) NONE SEEN   WBC, UA 0-5 0 - 5 WBC/hpf   RBC / HPF 0-5 0 - 5 RBC/hpf   Bacteria, UA RARE (A) NONE SEEN    Dg Ankle Complete Right  12/23/2015  CLINICAL DATA:  Pt states his knees gave out yesterday evening, and he fell, pains both feet and ankles, and both hips, but due to pt weight of over 400 lbs, he was  unable to get up, and laid there for 11 hours, so he is stiff, unable to move much or hold his legs in position, he states he has neuropathy, so feeling in his feet is decreased, he states no pains, Either foot or ankle, pains laterally both hips at the greater trochanter, which was there before the fall EXAM: RIGHT ANKLE - COMPLETE 3+ VIEW COMPARISON:  None. FINDINGS: There is a fracture across the proximal metaphysis of fifth metatarsal. This appears to be a chronic ununited fracture. CAD is better visualized on the current right foot radiographs. No evidence  of an ankle fracture. The ankle mortise is normally spaced and aligned. No significant arthropathic change. There are small calcaneal spurs. Calcifications noted in the ankle and foot arteries. IMPRESSION: 1. No ankle fracture dislocation. No significant ankle joint arthropathic change. 2. Fracture of the proximal fifth metatarsal metaphysis which appears to be a chronic ununited fracture. Electronically Signed   By: Amie Portland M.D.   On: 12/23/2015 14:28   Ct Head Wo Contrast  12/23/2015  CLINICAL DATA:  Status post fall today. The patient is anticoagulated. EXAM: CT HEAD WITHOUT CONTRAST TECHNIQUE: Contiguous axial images were obtained from the base of the skull through the vertex without intravenous contrast. COMPARISON:  None. FINDINGS: There is some cortical atrophy and chronic microvascular ischemic change. No evidence of acute intracranial abnormality including hemorrhage, infarct, mass lesion, mass effect, midline shift or abnormal extra-axial fluid collection is seen. No hydrocephalus or pneumocephalus. The calvarium is intact. Right mastoid effusion is identified. IMPRESSION: No acute abnormality. Mild atrophy and chronic microvascular ischemic change. Right mastoid effusion. Electronically Signed   By: Drusilla Kanner M.D.   On: 12/23/2015 14:40   Dg Foot Complete Left  12/23/2015  CLINICAL DATA:  The patient's knees gave way yesterday  resulting in a fall and left foot injury. Pain. Initial encounter. EXAM: LEFT FOOT - COMPLETE 3+ VIEW COMPARISON:  None. FINDINGS: There is no evidence of fracture or dislocation. There is no evidence of arthropathy or other focal bone abnormality. Soft tissues demonstrate atherosclerosis. IMPRESSION: No acute abnormality. Atherosclerosis. Electronically Signed   By: Drusilla Kanner M.D.   On: 12/23/2015 14:28   Dg Foot Complete Right  12/23/2015  CLINICAL DATA:  Fall yesterday.  Pain EXAM: RIGHT FOOT COMPLETE - 3+ VIEW COMPARISON:  None. FINDINGS: Transverse fracture base of fifth metatarsal with sclerotic margins. This appears to be a chronic fracture without bony union. Correlate with history and symptoms in this area. Widening of the space between the first and second metatarsals and medial and middle cuneiform suggesting Lisfranc injury. This may be chronic. Correlate with symptoms in this area. No acute fracture identified. Arterial calcification IMPRESSION: Chronic fracture of the fifth metatarsal without bony union Probable Lisfranc injury. This may be chronic. Correlate with symptoms in this area. Electronically Signed   By: Marlan Palau M.D.   On: 12/23/2015 14:27   Dg Hips Bilat With Pelvis 3-4 Views  12/23/2015  CLINICAL DATA:  Pt states his knees gave out yesterday evening, and he fell, pains both feet and ankles, and both hips, but due to pt weight of over 400 lbs, he was unable to get up, and laid there for 11 hours, so he is stiff, unable to move much or hold his legs in position, he states he has neuropathy, so feeling in his feet is decreased, he states no pains, Either foot or ankle, pains laterally both hips at the greater trochanter, which was there before the fall EXAM: DG HIP (WITH OR WITHOUT PELVIS) 3-4V BILAT COMPARISON:  None. FINDINGS: No fracture.  No bone lesion. Hip joints, SI joints and symphysis pubis are normally spaced and aligned. Soft tissues are unremarkable. IMPRESSION:  1. No fracture or dislocation.  No acute finding. Electronically Signed   By: Amie Portland M.D.   On: 12/23/2015 14:29    Dani Gobble, MD PGY-1, [facility]  FPTS Upper-Level Resident Addendum  I have independently interviewed and examined the patient. I have discussed the above with the original author and agree with their documentation.  My edits for correction/addition/clarification are in pink. Please see also any attending notes.   Pincus Large, DO PGY-2, [facility] FPTS Service pager: [phone] (text pages welcome through [facility])

## 2015-12-23 NOTE — ED Notes (Signed)
CBG: 485

## 2015-12-23 NOTE — Progress Notes (Signed)
Summary Care Management Handoff Summary  Patient Details  Name: [name]  MRN: [id number]  DOB: 04-22-1951  Michel Bickers, RN 12/23/2015, 5:13 PM    To Do From home with sister.

## 2015-12-24 DIAGNOSIS — M6282 Rhabdomyolysis: Secondary | ICD-10-CM | POA: Insufficient documentation

## 2015-12-24 DIAGNOSIS — L8990 Pressure ulcer of unspecified site, unspecified stage: Secondary | ICD-10-CM | POA: Insufficient documentation

## 2015-12-24 DIAGNOSIS — T796XXD Traumatic ischemia of muscle, subsequent encounter: Secondary | ICD-10-CM

## 2015-12-24 DIAGNOSIS — S93324D Dislocation of tarsometatarsal joint of right foot, subsequent encounter: Secondary | ICD-10-CM

## 2015-12-24 DIAGNOSIS — M5489 Other dorsalgia: Secondary | ICD-10-CM

## 2015-12-24 LAB — COMPREHENSIVE METABOLIC PANEL
ALBUMIN: 3 g/dL — AB (ref 3.5–5.0)
ALK PHOS: 49 U/L (ref 38–126)
ALT: 18 U/L (ref 17–63)
ANION GAP: 10 (ref 5–15)
AST: 44 U/L — AB (ref 15–41)
BILIRUBIN TOTAL: 1.2 mg/dL (ref 0.3–1.2)
BUN: 71 mg/dL — ABNORMAL HIGH (ref 6–20)
CO2: 26 mmol/L (ref 22–32)
Calcium: 8.8 mg/dL — ABNORMAL LOW (ref 8.9–10.3)
Chloride: 99 mmol/L — ABNORMAL LOW (ref 101–111)
Creatinine, Ser: 2.56 mg/dL — ABNORMAL HIGH (ref 0.61–1.24)
GFR calc Af Amer: 29 mL/min — ABNORMAL LOW (ref >60)
GFR calc non Af Amer: 25 mL/min — ABNORMAL LOW (ref >60)
Glucose, Bld: 423 mg/dL — ABNORMAL HIGH (ref 65–99)
POTASSIUM: 5.2 mmol/L — AB (ref 3.5–5.1)
SODIUM: 135 mmol/L (ref 135–145)
TOTAL PROTEIN: 6.7 g/dL (ref 6.5–8.1)

## 2015-12-24 LAB — CK: Total CK: 2163 U/L — ABNORMAL HIGH (ref 49–397)

## 2015-12-24 LAB — CBC
HEMATOCRIT: 31.7 % — AB (ref 39.0–52.0)
HEMOGLOBIN: 9.8 g/dL — AB (ref 13.0–17.0)
MCH: 24.2 pg — ABNORMAL LOW (ref 26.0–34.0)
MCHC: 30.9 g/dL (ref 30.0–36.0)
MCV: 78.3 fL (ref 78.0–100.0)
Platelets: 236 10*3/uL (ref 150–400)
RBC: 4.05 MIL/uL — AB (ref 4.22–5.81)
RDW: 16.9 % — ABNORMAL HIGH (ref 11.5–15.5)
WBC: 13.9 10*3/uL — AB (ref 4.0–10.5)

## 2015-12-24 LAB — GLUCOSE, CAPILLARY
GLUCOSE-CAPILLARY: 369 mg/dL — AB (ref 65–99)
GLUCOSE-CAPILLARY: 398 mg/dL — AB (ref 65–99)
GLUCOSE-CAPILLARY: 427 mg/dL — AB (ref 65–99)
GLUCOSE-CAPILLARY: 434 mg/dL — AB (ref 65–99)
Glucose-Capillary: 386 mg/dL — ABNORMAL HIGH (ref 65–99)
Glucose-Capillary: 358 mg/dL — ABNORMAL HIGH (ref 65–99)
Glucose-Capillary: 414 mg/dL — ABNORMAL HIGH (ref 65–99)

## 2015-12-24 MED ORDER — INSULIN ASPART 100 UNIT/ML ~~LOC~~ SOLN
0.0000 [IU] | Freq: Three times a day (TID) | SUBCUTANEOUS | Status: DC
  Administered 2015-12-24: 20 [IU] via SUBCUTANEOUS

## 2015-12-24 MED ORDER — INSULIN DETEMIR 100 UNIT/ML ~~LOC~~ SOLN
10.0000 [IU] | Freq: Once | SUBCUTANEOUS | Status: AC
  Administered 2015-12-24: 10 [IU] via SUBCUTANEOUS
  Filled 2015-12-24: qty 0.1

## 2015-12-24 MED ORDER — MUPIROCIN CALCIUM 2 % EX CREA
TOPICAL_CREAM | Freq: Two times a day (BID) | CUTANEOUS | Status: DC
  Administered 2015-12-24 – 2015-12-26 (×5): via TOPICAL
  Administered 2015-12-26: 1 via TOPICAL
  Administered 2015-12-27 – 2015-12-30 (×7): via TOPICAL
  Filled 2015-12-24: qty 15

## 2015-12-24 MED ORDER — INSULIN REGULAR HUMAN (CONC) 500 UNIT/ML ~~LOC~~ SOPN
20.0000 [IU] | PEN_INJECTOR | Freq: Three times a day (TID) | SUBCUTANEOUS | Status: DC
  Administered 2015-12-24 – 2015-12-25 (×2): 20 [IU] via SUBCUTANEOUS
  Filled 2015-12-24: qty 3

## 2015-12-24 MED ORDER — ATORVASTATIN CALCIUM 80 MG PO TABS
80.0000 mg | ORAL_TABLET | Freq: Every day | ORAL | Status: DC
  Administered 2015-12-25 – 2015-12-30 (×6): 80 mg via ORAL
  Filled 2015-12-24 (×7): qty 1

## 2015-12-24 MED ORDER — INSULIN REGULAR HUMAN (CONC) 500 UNIT/ML ~~LOC~~ SOPN
100.0000 [IU] | PEN_INJECTOR | Freq: Three times a day (TID) | SUBCUTANEOUS | Status: DC
  Filled 2015-12-24: qty 3

## 2015-12-24 MED ORDER — INSULIN ASPART 100 UNIT/ML ~~LOC~~ SOLN
0.0000 [IU] | SUBCUTANEOUS | Status: DC
  Administered 2015-12-24 (×2): 20 [IU] via SUBCUTANEOUS

## 2015-12-24 MED ORDER — SODIUM CHLORIDE 0.9 % IV SOLN
INTRAVENOUS | Status: AC
  Administered 2015-12-24: 16:00:00 via INTRAVENOUS

## 2015-12-24 MED ORDER — INSULIN DETEMIR 100 UNIT/ML ~~LOC~~ SOLN
20.0000 [IU] | Freq: Every day | SUBCUTANEOUS | Status: DC
  Administered 2015-12-24: 20 [IU] via SUBCUTANEOUS
  Filled 2015-12-24 (×2): qty 0.2

## 2015-12-24 MED ORDER — HYDROCERIN EX CREA
TOPICAL_CREAM | Freq: Every day | CUTANEOUS | Status: DC
  Administered 2015-12-24: 1 via TOPICAL
  Administered 2015-12-25 – 2015-12-30 (×6): via TOPICAL
  Filled 2015-12-24: qty 113

## 2015-12-24 MED ORDER — INSULIN DETEMIR 100 UNIT/ML ~~LOC~~ SOLN: 20.0000 [IU] | Freq: Every day | SUBCUTANEOUS | Status: DC

## 2015-12-24 MED ORDER — INSULIN ASPART 100 UNIT/ML ~~LOC~~ SOLN
0.0000 [IU] | Freq: Three times a day (TID) | SUBCUTANEOUS | Status: DC
  Administered 2015-12-25: 20 [IU] via SUBCUTANEOUS
  Administered 2015-12-25 (×2): 11 [IU] via SUBCUTANEOUS
  Administered 2015-12-26 (×2): 15 [IU] via SUBCUTANEOUS
  Administered 2015-12-26: 20 [IU] via SUBCUTANEOUS
  Administered 2015-12-27 (×2): 11 [IU] via SUBCUTANEOUS
  Administered 2015-12-28: 20 [IU] via SUBCUTANEOUS
  Administered 2015-12-28: 11 [IU] via SUBCUTANEOUS
  Administered 2015-12-28: 3 [IU] via SUBCUTANEOUS
  Administered 2015-12-29: 20 [IU] via SUBCUTANEOUS
  Administered 2015-12-29: 15 [IU] via SUBCUTANEOUS
  Administered 2015-12-30: 11 [IU] via SUBCUTANEOUS
  Administered 2015-12-30: 7 [IU] via SUBCUTANEOUS

## 2015-12-24 MED ORDER — INSULIN ASPART 100 UNIT/ML ~~LOC~~ SOLN
0.0000 [IU] | Freq: Every day | SUBCUTANEOUS | Status: DC
  Administered 2015-12-24: 5 [IU] via SUBCUTANEOUS
  Administered 2015-12-25: 4 [IU] via SUBCUTANEOUS

## 2015-12-24 NOTE — [place] FL2 (Signed)
[place] MEDICAID FL2 LEVEL OF CARE SCREENING TOOL     IDENTIFICATION  Patient Name: [name] Birthdate: [birth year] Sex: male Admission Date (Current Location): 12/23/2015  [facility] and [place] Number:  Producer, television/film/video and Address:  [facility] [place] [facility]. [facility], [address], [place], [place] [address]      Provider Number: [id number]  Attending Physician Name and Address:  Doreene Eland, MD  Relative Name and Phone Number:       Current Level of Care: Hospital Recommended Level of Care: Skilled Nursing Facility Prior Approval Number:    Date Approved/Denied:   PASRR Number: [id number]  Discharge Plan: SNF    Current Diagnoses: Patient Active Problem List   Diagnosis Date Noted  . Pressure ulcer 12/24/2015  . Rhabdomyolysis   . Acute kidney injury (HCC) 12/23/2015  . Back pain 12/23/2015  . Fall   . Metatarsal fracture   . Traumatic rhabdomyolysis (HCC)   . Hyperkalemia   . Diabetes mellitus type 2 in obese (HCC)   . Lisfranc dislocation   . Hyperglycemia   . Sepsis (HCC) 09/08/2015  . Scrotal wall abscess 09/08/2015  . AKI (acute kidney injury) (HCC) 09/08/2015  . Chest wall pain   . On amiodarone therapy 12/08/2014  . Respiratory failure with hypoxia (HCC) 12/04/2014  . Hypoxia 12/04/2014  . Acute on chronic respiratory failure with hypoxia (HCC)   . Atrial fibrillation with RVR (HCC) 09/20/2014  . Acute on chronic diastolic heart failure (HCC) 09/19/2014  . Atrial fibrillation with rapid ventricular response (HCC) 09/19/2014  . Acute respiratory failure (HCC) 09/19/2014  . Abnormal nuclear cardiac imaging test 02/12/2014  . Dyspnea 02/12/2014  . Chronic anticoagulation- Coumadin 02/12/2014  . CKD (chronic kidney disease) stage 3, GFR 30-59 ml/min 02/12/2014  . Chronic diastolic congestive heart failure (HCC) 02/11/2014  . Encounter for therapeutic drug monitoring 08/03/2013  . Pilonidal cyst 01/25/2013  . Perirectal cellulitis  09/06/2012  . Long term (current) use of anticoagulants 11/18/2011  . Obstructive sleep apnea- unable to tolerate c-pap 08/04/2011  . Coronary Artery Disease 08/04/2011  . Diabetes mellitus type 2, uncontrolled, with complications (HCC) 01/06/2009  . ANEMIA-IRON DEFICIENCY 01/06/2009  . OTHER AND UNSPECIFIED COAGULATION DEFECTS 01/06/2009  . RECTAL BLEEDING 01/06/2009  . THYROID STIMULATING HORMONE, ABNORMAL 01/02/2009  . ANEMIA 01/02/2009  . CARDIOVASCULAR FUNCTION STUDY, ABNORMAL 01/02/2009  . Essential hypertension, benign 10/22/2008  . Atrial fibrillation- DCCV 2010 10/22/2008  . DIAB W/UNSPEC COMP TYPE II/UNSPEC TYPE UNCNTRL 09/23/2008  . Hyperlipidemia 09/23/2008  . HYPOKALEMIA 09/23/2008  . OBESITY-MORBID BMI 54 09/23/2008  . CELLULITIS AND ABSCESS OF LEG EXCEPT FOOT 09/23/2008    Orientation RESPIRATION BLADDER Height & Weight     Self, Time, Situation, Place  Normal Continent, External catheter Weight: (!) 376 lb 11.2 oz (170.87 kg) (scale a) Height:  6' (182.9 cm)  BEHAVIORAL SYMPTOMS/MOOD NEUROLOGICAL BOWEL NUTRITION STATUS   (None)  (None) Continent Diet (Heart healthy/carb-modified)  AMBULATORY STATUS COMMUNICATION OF NEEDS Skin   Limited Assist Verbally PU Stage and Appropriate Care PU Stage 1 Dressing:  (Foam. Does not say how often.)                     Personal Care Assistance Level of Assistance  Bathing, Feeding, Dressing Bathing Assistance: Limited assistance Feeding assistance: Independent Dressing Assistance: Maximum assistance     Functional Limitations Info  Sight, Hearing, Speech Sight Info: Adequate Hearing Info: Adequate Speech Info: Adequate    SPECIAL  CARE FACTORS FREQUENCY  Blood pressure, Diabetic urine testing, PT (By licensed PT), OT (By licensed OT)     PT Frequency: 5 x week OT Frequency: 5 x week            Contractures Contractures Info: Not present    Additional Factors Info  Code Status, Allergies Code Status Info:  DNR Allergies Info: NKDA           Current Medications (12/24/2015):  This is the current hospital active medication list Current Facility-Administered Medications  Medication Dose Route Frequency Provider Last Rate Last Dose  . acetaminophen (TYLENOL) tablet 650 mg  650 mg Oral Q6H PRN Casey Burkitt, MD       Or  . acetaminophen (TYLENOL) suppository 650 mg  650 mg Rectal Q6H PRN Casey Burkitt, MD      . amiodarone (PACERONE) tablet 200 mg  200 mg Oral Daily Casey Burkitt, MD   200 mg at 12/24/15 1011  . [START ON 12/25/2015] atorvastatin (LIPITOR) tablet 80 mg  80 mg Oral Daily Hillary Percell Boston, MD      . fentaNYL (SUBLIMAZE) injection 50 mcg  50 mcg Intravenous Q4H PRN Casey Burkitt, MD      . gabapentin (NEURONTIN) capsule 600 mg  600 mg Oral BID Casey Burkitt, MD   600 mg at 12/24/15 1011  . hydrocerin (EUCERIN) cream   Topical Daily Doreene Eland, MD   1 application at 12/24/15 1353  . insulin aspart (novoLOG) injection 0-20 Units  0-20 Units Subcutaneous TID WC Hillary Percell Boston, MD      . insulin detemir (LEVEMIR) injection 10 Units  10 Units Subcutaneous QHS Casey Burkitt, MD   10 Units at 12/23/15 2255  . [START ON 12/25/2015] insulin detemir (LEVEMIR) injection 20 Units  20 Units Subcutaneous QHS Hillary Percell Boston, MD      . insulin regular human CONCENTRATED (HUMULIN R) 500 UNIT/ML kwikpen 100 Units  100 Units Subcutaneous TID [facility] Hillary Percell Boston, MD      . metoprolol (LOPRESSOR) tablet 50 mg  50 mg Oral BID Casey Burkitt, MD   50 mg at 12/24/15 1011  . mupirocin cream (BACTROBAN) 2 %   Topical BID Doreene Eland, MD      . oxyCODONE (Oxy IR/ROXICODONE) immediate release tablet 20 mg  20 mg Oral Q6H PRN Casey Burkitt, MD   20 mg at 12/24/15 1010  . polyethylene glycol (MIRALAX / GLYCOLAX) packet 17 g  17 g Oral Daily PRN Casey Burkitt, MD   17 g at 12/24/15 1014   . polyvinyl alcohol (LIQUIFILM TEARS) 1.4 % ophthalmic solution 1 drop  1 drop Both Eyes PRN Doreene Eland, MD      . rivaroxaban (XARELTO) tablet 20 mg  20 mg Oral Q supper Casey Burkitt, MD   20 mg at 12/23/15 2130  . sodium chloride flush (NS) 0.9 % injection 3 mL  3 mL Intravenous Q12H Casey Burkitt, MD   3 mL at 12/24/15 1012     Discharge Medications: Please see discharge summary for a list of discharge medications.  Relevant Imaging Results:  Relevant Lab Results:   Additional Information SS#: [id number]  Margarito Liner, LCSW

## 2015-12-24 NOTE — Clinical Social Work Note (Signed)
Clinical Social Work Assessment  Patient Details  Name: [name] MRN: [id number] Date of Birth: Dec 20, 1950  Date of referral:  12/24/15               Reason for consult:  Facility Placement, Discharge Planning                Permission sought to share information with:  Facility Sport and exercise psychologist, Family Supports Permission granted to share information::  Yes, Verbal Permission Granted  Name::     Loyed Wilmes  Agency::  SNF's  Relationship::  Sister  Contact Information:  [phone]  Housing/Transportation Living arrangements for the past 2 months:  Single Family Home Source of Information:  Patient, Medical Team Patient Interpreter Needed:  None Criminal Activity/Legal Involvement Pertinent to Current Situation/Hospitalization:  No - Comment as needed Significant Relationships:  Siblings Lives with:  Siblings Do you feel safe going back to the place where you live?  Yes Need for family participation in patient care:  Yes (Comment)  Care giving concerns:  PT recommending SNF once medically stable for discharge.   Social Worker assessment / plan:  CSW met with patient. No supports at bedside. CSW introduced role and explained that discharge planning would be discussed. Discussed SNF recommendation. Patient prefers home health but is willing to go to SNF. CSW asked about 24/7 care. Patient states that his sister that he lives with would do it if she could be compensated. Patient gave permission to fax information out to SNF's in the area. No further concerns. CSW encouraged patient to contact CSW as needed. CSW will continue to follow patient and facilitate discharge to SNF once medically stable.  Employment status:  Disabled (Comment on whether or not currently receiving Disability) Insurance information:  Managed Medicare PT Recommendations:  [facility] / Referral to community resources:  [place]  Patient/Family's Response  to care:  Patient agreeable to SNF placement but prefers home health. Patient's sister supportive and involved in patient's care. Patient polite and appreciated social work intervention.  Patient/Family's Understanding of and Emotional Response to Diagnosis, Current Treatment, and Prognosis:  Patient knowledgeable of medical interventions and aware of plan for discharge to SNF once medically stable.  Emotional Assessment Appearance:  Appears stated age Attitude/Demeanor/Rapport:   (Pleasant) Affect (typically observed):  Accepting, Appropriate, Calm, Pleasant Orientation:  Oriented to Self, Oriented to Place, Oriented to  Time, Oriented to Situation Alcohol / Substance use:  Never Used Psych involvement (Current and /or in the community):  No (Comment)  Discharge Needs  Concerns to be addressed:  Care Coordination Readmission within the last 30 days:  No Current discharge risk:  Dependent with Mobility Barriers to Discharge:  No Barriers Identified   Candie Chroman, LCSW 12/24/2015, 2:56 PM

## 2015-12-24 NOTE — Progress Notes (Signed)
Inpatient Diabetes Program Recommendations  AACE/ADA: New Consensus Statement on Inpatient Glycemic Control (2015)  Target Ranges:  Prepandial:   less than 140 mg/dL      Peak postprandial:   less than 180 mg/dL (1-2 hours)      Critically ill patients:  140 - 180 mg/dL   Lab Results  Component Value Date   GLUCAP 358* 12/24/2015   HGBA1C 8.7 10/14/2015    Review of Glycemic Control  Diabetes history: yes Outpatient Diabetes medications: Levemir, Humulin R U-500  Current orders for Inpatient glycemic control: Levemir 10 units, Novolog resistant scale   Inpatient Diabetes Program Recommendations:  Insulin - IV drip/GlucoStabilizer: Use IV Insulin/GlucoStabilizer order-set (pt can eat while on gtt) Once stabilized, recommend transitioning patient to his home dose U-500.   Family medicine paged and alerted to this recommendation. Thank you  Piedad Climes MSN, RN,CDE Inpatient Diabetes Coordinator [phone] (team pager)

## 2015-12-24 NOTE — Progress Notes (Signed)
Spoke with pharmacist at [facility] where patient gets his insulin. Confirmed insulin doses from med rec which are levemir 20 units daily and Humulin R 20 units TID. However, patient states he takes 100 units with meals. He also reported this to hospital pharmacist. Will give 20 units of Humulin R before dinner and recheck CGB around 2000. If minimal response, will consider giving patient's reported dose starting tomorrow. CBGs have been in the high 300s/low 400s.

## 2015-12-24 NOTE — Care Management Important Message (Signed)
Important Message  Patient Details  Name: [name] MRN: [id number] Date of Birth: [birth year]   Medicare Important Message Given:  Yes    Bernadette Hoit 12/24/2015, 8:07 AM

## 2015-12-24 NOTE — Clinical Social Work Placement (Signed)
   CLINICAL SOCIAL WORK PLACEMENT  NOTE  Date:  12/24/2015  Patient Details  Name: [name] MRN: [id number] Date of Birth: 17-Jan-1951  Clinical Social Work is seeking post-discharge placement for this patient at the Skilled  Nursing Facility level of care (*CSW will initial, date and re-position this form in  chart as items are completed):  Yes   Patient/family provided with [place] Clinical Social Work Department's list of facilities offering this level of care within the geographic area requested by the patient (or if unable, by the patient's family).  Yes   Patient/family informed of their freedom to choose among providers that offer the needed level of care, that participate in Medicare, Medicaid or managed care program needed by the patient, have an available bed and are willing to accept the patient.  Yes   Patient/family informed of 's ownership interest in [facility] and [facility], as well as of the fact that they are under no obligation to receive care at these facilities.  PASRR submitted to EDS on 12/24/15     PASRR number received on 12/24/15     Existing PASRR number confirmed on       FL2 transmitted to all facilities in geographic area requested by pt/family on 12/24/15     FL2 transmitted to all facilities within larger geographic area on       Patient informed that his/her managed care company has contracts with or will negotiate with certain facilities, including the following:            Patient/family informed of bed offers received.  Patient chooses bed at       Physician recommends and patient chooses bed at      Patient to be transferred to   on  .  Patient to be transferred to facility by       Patient family notified on   of transfer.  Name of family member notified:        PHYSICIAN       Additional Comment:    _______________________________________________ Margarito Liner, LCSW 12/24/2015, 2:54 PM

## 2015-12-24 NOTE — Evaluation (Signed)
Occupational Therapy Evaluation Patient Details Name: [name] MRN: [id number] DOB: 18-Feb-1951 Today's Date: 12/24/2015    History of Present Illness 65 Y/O M with Pmx of DM2, HLD, morbid obesity, HTN, OSA, CAD, HFpEF,CKD III, Afib presented with hx of fall more than 12 hours ago with leg pain upon trying to get up. Found to have AKI and rhabdomyolsis   Clinical Impression   This 65 yo male admitted and underwent above presents to acute OT with deficits below (see OT problem list) thus affecting his PLOF of independent with his basic and IADLs. He will benefit from acute OT without follow up OT at SNF being the primary recommendation due to pt does not have 24 hour A at home which he currently needs.    Follow Up Recommendations  SNF;Other (comment) (pt would prefer home with home help and 24 hour care if this can be arranged between sister and aids)    Equipment Recommendations  3 in 1 bedside comode       Precautions / Restrictions Precautions Precautions: Fall Restrictions Weight Bearing Restrictions: No RLE Weight Bearing: Weight bearing as tolerated LLE Weight Bearing: Weight bearing as tolerated      Mobility Bed Mobility Overal bed mobility: Needs Assistance Bed Mobility: Supine to Sit     Supine to sit: HOB elevated;Min guard     General bed mobility comments: increased time and cues for hand placement--pt normally sleeps in a recliner at home  Transfers Overall transfer level: Needs assistance Equipment used: Rolling walker (2 wheeled) Transfers: Sit to/from [employer] Sit to Stand: Min guard;+2 physical assistance;From elevated surface Stand pivot transfers: Min assist;+2 physical assistance            Balance Overall balance assessment: Needs assistance Sitting-balance support: Feet supported;No upper extremity supported Sitting balance-Leahy Scale: Fair     Standing balance support: Bilateral upper extremity supported;During  functional activity Standing balance-Leahy Scale: Fair Standing balance comment: reliant on RW                            ADL Overall ADL's : Needs assistance/impaired Eating/Feeding: Independent;Sitting   Grooming: Supervision/safety;Set up;Sitting   Upper Body Bathing: Supervision/ safety;Set up;Sitting   Lower Body Bathing: Moderate assistance (min guard +2 sit<>stand (raised bed))   Upper Body Dressing : Set up;Supervision/safety;Sitting   Lower Body Dressing: Maximal assistance Lower Body Dressing Details (indicate cue type and reason): min guard +2 sit<>stand raised bed   Toilet Transfer Details (indicate cue type and reason): min guard +2 sit<>stand with min A +2 stand turn to from bed to recliner Toileting- Clothing Manipulation and Hygiene: Moderate assistance (min guard +2 sit<>stand raised be)                         Pertinent Vitals/Pain Pain Assessment: 0-10 Pain Score: 8  Pain Location: Bil hips and back Pain Descriptors / Indicators: Aching;Sore Pain Intervention(s): Limited activity within patient's tolerance;Repositioned     Hand Dominance  right    Extremity/Trunk Assessment Upper Extremity Assessment Upper Extremity Assessment: Overall WFL for tasks assessed   Lower Extremity Assessment Lower Extremity Assessment: Defer to PT evaluation   Cervical / Trunk Assessment Cervical / Trunk Assessment: Normal   Communication  no problems   Cognition Arousal/Alertness: Awake/alert Behavior During Therapy: WFL for tasks assessed/performed Overall Cognitive Status: Within Functional Limits for tasks assessed  Home Living Family/patient expects to be discharged to:: Private residence Living Arrangements: Other relatives (sister who works full time) Available Help at Discharge: Family;Available PRN/intermittently Type of Home: House Home Access: Stairs to enter [employer] of  Steps: 5 Entrance Stairs-Rails: Left;Right Home Layout: One level     Bathroom Shower/Tub: Walk-in Pensions consultant: Standard     Home Equipment: Shower seat;Hand held shower head   Additional Comments: pt lives with his sister, they keep an eye on each other. Drives, does his own shopping and self care, sleeps in a recliner      Prior Functioning/Environment Level of Independence:  (mod I)             OT Diagnosis: Generalized weakness;Acute pain   OT Problem List: Decreased strength;Decreased range of motion;Decreased activity tolerance;Impaired balance (sitting and/or standing);Pain;Obesity;Decreased knowledge of use of DME or AE   OT Treatment/Interventions: Self-care/ADL training;Therapeutic activities;DME and/or AE instruction;Energy conservation;Patient/family education;Balance training    OT Goals(Current goals can be found in the care plan section) Acute Rehab OT Goals Patient Stated Goal: to be able to go home OT Goal Formulation: With patient Time For Goal Achievement: 01/07/16 Potential to Achieve Goals: Good  OT Frequency: Min 2X/week   Barriers to D/C: Decreased caregiver support          Co-evaluation PT/OT/SLP Co-Evaluation/Treatment: Yes Reason for Co-Treatment: For patient/therapist safety   OT goals addressed during session: ADL's and self-care;Strengthening/ROM      End of Session Equipment Utilized During Treatment: Gait belt;Rolling walker Nurse Communication: Mobility status (NT)  Activity Tolerance: Patient limited by fatigue (and increased work of breathing with all transitions--O2 sats good) Patient left: in chair;with call bell/phone within reach;with chair alarm set   Time: 1045-1108 OT Time Calculation (min): 23 min Charges:  OT General Charges $OT Visit: 1 Procedure OT Evaluation $OT Eval Moderate Complexity: 1 Procedure  Evette Georges [phone] 12/24/2015, 12:32 PM

## 2015-12-24 NOTE — Evaluation (Signed)
Physical Therapy Evaluation Patient Details Name: [name] MRN: [id number] DOB: 07-19-1950 Today's Date: 12/24/2015   History of Present Illness  65 Y/O M with Pmx of DM2, HLD, morbid obesity, HTN, OSA, CAD, HFpEF,CKD III, Afib presented with hx of fall more than 12 hours ago with leg pain upon trying to get up. Found to have AKI and rhabdomyolsis    Clinical Impression  Pt admitted with above diagnosis. Pt currently with functional limitations due to the deficits listed below (see PT Problem List). PTA pt at mod I level of functioning for his ADLs and not using AD.  He currently requires min +2 assist to steady w/ pivot to chair. Pt will need to d/c to SNF if 24/7 assist not available at home.  Pt will benefit from skilled PT to increase their independence and safety with mobility to allow discharge to the venue listed below.      Follow Up Recommendations SNF;Other (comment) (pt would prefer home w/ home help and 24 hr care if possible)    Equipment Recommendations  Other (comment) (Bariatric RW)    Recommendations for Other Services       Precautions / Restrictions Precautions Precautions: Fall Restrictions Weight Bearing Restrictions: No RLE Weight Bearing: Weight bearing as tolerated LLE Weight Bearing: Weight bearing as tolerated      Mobility  Bed Mobility Overal bed mobility: Needs Assistance Bed Mobility: Supine to Sit     Supine to sit: HOB elevated;Min guard     General bed mobility comments: increased time and cues for hand placement--pt normally sleeps in a recliner at home  Transfers Overall transfer level: Needs assistance Equipment used: Rolling walker (2 wheeled) Transfers: Sit to/from [employer] Sit to Stand: Min guard;+2 physical assistance;From elevated surface Stand pivot transfers: Min assist;+2 physical assistance       General transfer comment: Min assist to steady w/ pivot to chair. Cues for hand  placement.  Ambulation/Gait             General Gait Details: did not attempt for pt/therapist safety  Stairs            Wheelchair Mobility    Modified Rankin (Stroke Patients Only)       Balance Overall balance assessment: Needs assistance;History of Falls Sitting-balance support: Feet supported;No upper extremity supported Sitting balance-Leahy Scale: Fair Sitting balance - Comments: min guard for safety   Standing balance support: Bilateral upper extremity supported;During functional activity Standing balance-Leahy Scale: Poor Standing balance comment: Relies on UE support                             Pertinent Vitals/Pain Pain Assessment: 0-10 Pain Score: 8  Pain Location: Bil hips and back Pain Descriptors / Indicators: Aching;Grimacing Pain Intervention(s): Limited activity within patient's tolerance;Monitored during session;Repositioned    Home Living Family/patient expects to be discharged to:: Private residence Living Arrangements: Other relatives (sister who works full time) Available Help at Discharge: Family;Available PRN/intermittently Type of Home: House Home Access: Stairs to enter Entrance Stairs-Rails: Lawyer of Steps: 5 Home Layout: One level Home Equipment: Shower seat;Hand held shower head Additional Comments: pt lives with his sister, they keep an eye on each other. Drives, does his own shopping and self care, sleeps in a recliner    Prior Function Level of Independence:  (mod I)               Hand Dominance  Dominant Hand: Right    Extremity/Trunk Assessment   Upper Extremity Assessment: Defer to OT evaluation           Lower Extremity Assessment: Generalized weakness      Cervical / Trunk Assessment: Normal  Communication   Communication: No difficulties  Cognition Arousal/Alertness: Awake/alert Behavior During Therapy: WFL for tasks assessed/performed Overall Cognitive  Status: Within Functional Limits for tasks assessed                      General Comments General comments (skin integrity, edema, etc.): HR up to 101 and SpO2 98% during pivot.    Exercises General Exercises - Lower Extremity Ankle Circles/Pumps: AROM;Both;10 reps;Seated      Assessment/Plan    PT Assessment Patient needs continued PT services  PT Diagnosis Difficulty walking;Generalized weakness   PT Problem List Decreased strength;Decreased activity tolerance;Decreased balance;Decreased mobility;Decreased knowledge of use of DME;Decreased safety awareness;Cardiopulmonary status limiting activity;Obesity  PT Treatment Interventions Gait training;DME instruction;Stair training;Functional mobility training;Therapeutic activities;Therapeutic exercise;Balance training;Patient/family education   PT Goals (Current goals can be found in the Care Plan section) Acute Rehab PT Goals Patient Stated Goal: to be able to go home PT Goal Formulation: With patient Time For Goal Achievement: 01/07/16 Potential to Achieve Goals: Good    Frequency Min 3X/week   Barriers to discharge Decreased caregiver support;Inaccessible home environment Lives alone w/ steps to enter home    Co-evaluation PT/OT/SLP Co-Evaluation/Treatment: Yes Reason for Co-Treatment: For patient/therapist safety PT goals addressed during session: Mobility/safety with mobility;Balance;Proper use of DME;Strengthening/ROM OT goals addressed during session: ADL's and self-care;Strengthening/ROM       End of Session Equipment Utilized During Treatment: Gait belt Activity Tolerance: Patient limited by fatigue Patient left: in chair;with call bell/phone within reach;with chair alarm set Nurse Communication: Mobility status         Time: 0300-9233 PT Time Calculation (min) (ACUTE ONLY): 17 min   Charges:   PT Evaluation $PT Eval Low Complexity: 1 Procedure     PT G Codes:       Encarnacion Chu PT, DPT   Pager: [phone] Phone: [phone] 12/24/2015, 1:06 PM

## 2015-12-24 NOTE — Consult Note (Addendum)
WOC wound consult note Reason for Consult: Consult requested for bilat toes and buttocks and left flank.  Pt states he had an cybasious cyst drained to his buttocks "along time ago" and it has never closed completely.  This is NOT a pressure injury.  Upper buttock with chronic full thickness wound; 1X1X.1cm, red and dry, surrounded by dry raised callous to wound edges. No odor or drainage. Surrounding bilat buttocks and sacrum area with dry yellow raised, crusted skin.  Left flank also has the same appearance, pt does not know the etiology.   Bilat tips of toes with black dry scabs; patient states he fell prior to admission and had to crawl across the floor and these wounds are the result of abrasions and shear. Each area is approx .5X.5cm with small amt old dried blood at the locations. Dressing procedure/placement/frequency: Pt prefers to leave buttock and toe wounds open to air.  Eucerin cream to promote healing to dry crusted skin on buttocks and left flank.  Bactroban to promote moist healing to bilat toe wounds.  Discussed plan of care and patient verbalized understanding. Please re-consult if further assistance is needed.  Thank-you,  Cammie Mcgee MSN, RN, CWOCN, [place], CNS [phone]

## 2015-12-24 NOTE — Progress Notes (Signed)
Family Medicine Teaching Service Daily Progress Note Intern Pager: [phone]  Patient name: [name] Medical record number: [id number] Date of birth: September 14, 1950 Age: 65 y.o. Gender: male  Primary Care Provider: [name], MD Consultants: Orthopedic Surgery Code Status: DNR  Pt Overview and Major Events to Date:  6/13: Admitted after a fall  Assessment and Plan: MAHONRI [name] is a 65 y.o. male presenting with back pain after fall. PMH is significant for chronic diastolic CHF, CAD (s/p PCI to RCA in 2012, prior cath in 01/2014 with elevated L/RH pressures, mild-mod CAD of LAD/LCx with patent RCA, normal EF, c/b CIN/CHF), morbid obesity, paroxysmal atrial fibrillation, OSA (unable to tolerate CPAP), DM, HLD, anemia, CKD stage III, deconditioning.   #Back pain after fall: History of falls, with an admission in December 2016 in setting of afib with RVR. Does have history of chronic back pain and LE neuropathy with numbness. Believe neuropathy contributes to reecurrent falls as patient unable to feel his feet on exam. Imaging unremarkable except for right proximal metatarsal fracture likely chronic with possible Lisfranc injury. -admit to telemetry, Attending Dr. Lum Babe -obtain lumbar and thoracic spine xrays -ordered home oxycodone 20 mg q6h prn  -fentanyl 50 mg q4h prn -PT/OT consult -ortho consult placed, appears chronic; may need a boot -fall precautions  #Rhabodomyolysis. Due to fall with prolonged lie. CK 3871. Denies muscle aches. Moderate hemoglobin on UA.  -s/p 2L fluid in ED -trend CK --> 2163 -IV hydration with NS @ 150 mL/hr  #AoCKD: SCr 2.56, improved from SCr of 3.12 on admission. Baseline SCr ~1.5. Likely ATN in setting of rhabdomyolysis. Pre-renal contribution as well with BUN of 87.  -daily BMPs -avoid nephrotoxic agents -providing fluid resuscitation -I/Os --> +1.4 L  #Hyperkalemia: Improved from 6.0 to 5.2 (6/14). EKG without peaked T waves, though low  voltage due to patient habitus.  -hold potassium chloride supplement -insulin regimen ordered -repeat EKG in the morning without peaked T waves -monitor on telemetry -daily BMP  #T2DM. A1c 10/2015 8.7. Takes Levemir 20U daily and Humulin R 20U TID with meals. CBGs elevated to 450 in the ED. Anion gap of 15, improved to 10. Ketones on UA but in setting of dehydration. Monitor closely for development of DKA.  -Restart home dosing of medication -SSI, resistant scale (may need to increase to resistant due to obesity) -qAC/HS CBGs  #CHF. Echo 11/2014 EF 50-55% but with LVH. Treatment aimed at controlling LE edema and dyspnea on exertion. No signs of fluid overload on exam (no LE edema, lung CTAB). No evidence of fluid overload on today's exam despite fluid administration. -hold lasix 80 mg BID for AKI -hold metolazone 5 mg daily for AKI -continue metoprolol 100 mg BID -Monitor fluid status. -daily weights --> 379 > 376  #Paroxysmal A.fib: In afib on exam. HR <100.  -continue Xarelto  -continue amiodarone 200 mg daily and metoprolol 100 mg BID  #HTN: Soft pressures on admission.  -continue home metoprolol -hold lisinopril in setting of AKI  #CAD/HLD -switch from crestor to atorvastatin due to renal function -on xarelto  #Leukocytosis: 13.9, down from 17.3 on admission. Afebrile on presentation.  -monitor with daily CBCs  #OSA.  -Uses 2L of O2 at home -CPAP ordered for hospitalization --> refused overnight  #Microcytic anemia: Stable at 10.4. Baseline ~ 10.  -Obtain iron studies --> Iron WNL -FOBT positive in ED --> will repeat (if positive consider stopping xarelto)  FEN/GI: Heart Healthy/Carb Modified Diet, IVFs NS @ 150 mL/hr  Prophylaxis: on xarelto  Disposition: Admit for pain control and assessment of mobility.   Subjective:  Patient complaining of hip pain worse than back pain today. He says his back pain is similar to his chronic pain and rates it at an 8/10.    Objective: Temp:  [97.6 F (36.4 C)-98.1 F (36.7 C)] 98.1 F (36.7 C) (06/14 0350) Pulse Rate:  [82-99] 99 (06/14 0350) Resp:  [15-20] 18 (06/14 0350) BP: (102-130)/(46-69) 125/60 mmHg (06/14 0350) SpO2:  [92 %-100 %] 100 % (06/14 0350) FiO2 (%):  [28 %] 28 % (06/13 2040) Weight:  [376 lb 11.2 oz (170.87 kg)-379 lb 8 oz (172.14 kg)] 376 lb 11.2 oz (170.87 kg) (06/14 0350) Physical Exam: General: Morbidly obese male, lying in bed Neck: Supple, could not assess for JVD due to habitus Cardiovascular: Irregularly irregular, S1, S2, no m/r/g Respiratory: CTAB, moving air well throughout  Abdomen: +BS, soft, ND, suprapubic tenderness MSK: LE strength 4/5 on the left and 4/5 on the right. He can wiggle his toes. Abrasions across both feet. No LE edema.  Skin: Very dry skin across feet, bruised toenails.  Neuro: AOx3. Decreased sensation to touch to ankle of R LE and to mid-shin to L LE.  Psych: Appropriate mood and affect.   Laboratory:  Recent Labs Lab 12/23/15 1310 12/24/15 0331  WBC 17.3* 13.9*  HGB 10.4* 9.8*  HCT 32.9* 31.7*  PLT 235 236    Recent Labs Lab 12/23/15 1310 12/24/15 0331  NA 133* 135  K 6.0* 5.2*  CL 95* 99*  CO2 23 26  BUN 87* 71*  CREATININE 3.12* 2.56*  CALCIUM 8.9 8.8*  PROT 6.6 6.7  BILITOT 1.7* 1.2  ALKPHOS 53 49  ALT 20 18  AST 71* 44*  GLUCOSE 445* 423*   Imaging/Diagnostic Tests: Dg Thoracic Spine 2 View  12/23/2015  CLINICAL DATA:  Fall with mid thoracic and lower back pain. Initial encounter. EXAM: THORACIC SPINE 2 VIEWS COMPARISON:  07/08/2015 chest x-ray FINDINGS: Bony detail is suboptimal due to patient size. No convincing fracture or subluxation. T1 vertebra incompletely visualized, but reportedly distant from the patient's mid and lower back pain. Multilevel spondylosis and degenerative disc narrowing, greatest at the lower thoracic levels. IMPRESSION: 1. No suspected fracture.  No subluxation. 2. Spondylosis and degenerative  disc narrowing. 3. Sensitivity limited by patient size. Electronically Signed   By: Marnee Spring M.D.   On: 12/23/2015 16:49   Dg Lumbar Spine Complete  12/23/2015  CLINICAL DATA:  Fall with lower back pain.  Initial encounter. EXAM: LUMBAR SPINE - COMPLETE 4+ VIEW COMPARISON:  07/11/2015 FINDINGS: No evidence of acute fracture or traumatic malalignment. Spondylosis. Mild to moderate L1-2 and L2-3 degenerative disc narrowing. Extensive arterial calcification. Bony detail diminished by patient size. IMPRESSION: No acute finding. Electronically Signed   By: Marnee Spring M.D.   On: 12/23/2015 16:51   Dg Ankle Complete Left  12/23/2015  CLINICAL DATA:  Pt states his knees gave out yesterday evening, and he fell, pains both feet and ankles, and both hips, but due to pt weight of over 400 lbs, he was unable to get up, and laid there for 11 hours, so he is stiff, unable to move much or hold his legs in position, he states he has neuropathy, so feeling in his feet is decreased, he states no pains, Either foot or ankle, pains laterally both hips at the greater trochanter, which was there before the fall EXAM: LEFT ANKLE COMPLETE - 3+ VIEW  COMPARISON:  None. FINDINGS: No fracture.  The ankle mortise is normally spaced and aligned. Soft tissues are unremarkable other than arterial vascular calcifications. IMPRESSION: No fracture or dislocation.  No acute finding. Electronically Signed   By: Amie Portland M.D.   On: 12/23/2015 17:16   Dg Ankle Complete Right  12/23/2015  CLINICAL DATA:  Pt states his knees gave out yesterday evening, and he fell, pains both feet and ankles, and both hips, but due to pt weight of over 400 lbs, he was unable to get up, and laid there for 11 hours, so he is stiff, unable to move much or hold his legs in position, he states he has neuropathy, so feeling in his feet is decreased, he states no pains, Either foot or ankle, pains laterally both hips at the greater trochanter, which was  there before the fall EXAM: RIGHT ANKLE - COMPLETE 3+ VIEW COMPARISON:  None. FINDINGS: There is a fracture across the proximal metaphysis of fifth metatarsal. This appears to be a chronic ununited fracture. CAD is better visualized on the current right foot radiographs. No evidence of an ankle fracture. The ankle mortise is normally spaced and aligned. No significant arthropathic change. There are small calcaneal spurs. Calcifications noted in the ankle and foot arteries. IMPRESSION: 1. No ankle fracture dislocation. No significant ankle joint arthropathic change. 2. Fracture of the proximal fifth metatarsal metaphysis which appears to be a chronic ununited fracture. Electronically Signed   By: Amie Portland M.D.   On: 12/23/2015 14:28   Ct Head Wo Contrast  12/23/2015  CLINICAL DATA:  Status post fall today. The patient is anticoagulated. EXAM: CT HEAD WITHOUT CONTRAST TECHNIQUE: Contiguous axial images were obtained from the base of the skull through the vertex without intravenous contrast. COMPARISON:  None. FINDINGS: There is some cortical atrophy and chronic microvascular ischemic change. No evidence of acute intracranial abnormality including hemorrhage, infarct, mass lesion, mass effect, midline shift or abnormal extra-axial fluid collection is seen. No hydrocephalus or pneumocephalus. The calvarium is intact. Right mastoid effusion is identified. IMPRESSION: No acute abnormality. Mild atrophy and chronic microvascular ischemic change. Right mastoid effusion. Electronically Signed   By: Drusilla Kanner M.D.   On: 12/23/2015 14:40   Dg Foot Complete Left  12/23/2015  CLINICAL DATA:  The patient's knees gave way yesterday resulting in a fall and left foot injury. Pain. Initial encounter. EXAM: LEFT FOOT - COMPLETE 3+ VIEW COMPARISON:  None. FINDINGS: There is no evidence of fracture or dislocation. There is no evidence of arthropathy or other focal bone abnormality. Soft tissues demonstrate  atherosclerosis. IMPRESSION: No acute abnormality. Atherosclerosis. Electronically Signed   By: Drusilla Kanner M.D.   On: 12/23/2015 14:28   Dg Foot Complete Right  12/23/2015  CLINICAL DATA:  Fall yesterday.  Pain EXAM: RIGHT FOOT COMPLETE - 3+ VIEW COMPARISON:  None. FINDINGS: Transverse fracture base of fifth metatarsal with sclerotic margins. This appears to be a chronic fracture without bony union. Correlate with history and symptoms in this area. Widening of the space between the first and second metatarsals and medial and middle cuneiform suggesting Lisfranc injury. This may be chronic. Correlate with symptoms in this area. No acute fracture identified. Arterial calcification IMPRESSION: Chronic fracture of the fifth metatarsal without bony union Probable Lisfranc injury. This may be chronic. Correlate with symptoms in this area. Electronically Signed   By: Marlan Palau M.D.   On: 12/23/2015 14:27   Dg Hips Bilat With Pelvis 3-4 Views  12/23/2015  CLINICAL DATA:  Pt states his knees gave out yesterday evening, and he fell, pains both feet and ankles, and both hips, but due to pt weight of over 400 lbs, he was unable to get up, and laid there for 11 hours, so he is stiff, unable to move much or hold his legs in position, he states he has neuropathy, so feeling in his feet is decreased, he states no pains, Either foot or ankle, pains laterally both hips at the greater trochanter, which was there before the fall EXAM: DG HIP (WITH OR WITHOUT PELVIS) 3-4V BILAT COMPARISON:  None. FINDINGS: No fracture.  No bone lesion. Hip joints, SI joints and symphysis pubis are normally spaced and aligned. Soft tissues are unremarkable. IMPRESSION: 1. No fracture or dislocation.  No acute finding. Electronically Signed   By: Amie Portland M.D.   On: 12/23/2015 14:29    Rylee Nuzum Percell [place], MD 12/24/2015, 7:22 AM PGY-1, [facility] Family Medicine FPTS Intern pager: [phone], text pages welcome

## 2015-12-25 DIAGNOSIS — E118 Type 2 diabetes mellitus with unspecified complications: Secondary | ICD-10-CM

## 2015-12-25 DIAGNOSIS — Z794 Long term (current) use of insulin: Secondary | ICD-10-CM

## 2015-12-25 DIAGNOSIS — E113299 Type 2 diabetes mellitus with mild nonproliferative diabetic retinopathy without macular edema, unspecified eye: Secondary | ICD-10-CM | POA: Insufficient documentation

## 2015-12-25 DIAGNOSIS — S92901S Unspecified fracture of right foot, sequela: Secondary | ICD-10-CM

## 2015-12-25 DIAGNOSIS — Z1211 Encounter for screening for malignant neoplasm of colon: Secondary | ICD-10-CM

## 2015-12-25 DIAGNOSIS — D649 Anemia, unspecified: Secondary | ICD-10-CM

## 2015-12-25 DIAGNOSIS — K922 Gastrointestinal hemorrhage, unspecified: Secondary | ICD-10-CM | POA: Insufficient documentation

## 2015-12-25 DIAGNOSIS — S92909A Unspecified fracture of unspecified foot, initial encounter for closed fracture: Secondary | ICD-10-CM | POA: Insufficient documentation

## 2015-12-25 LAB — GLUCOSE, CAPILLARY
GLUCOSE-CAPILLARY: 298 mg/dL — AB (ref 65–99)
GLUCOSE-CAPILLARY: 317 mg/dL — AB (ref 65–99)
Glucose-Capillary: 287 mg/dL — ABNORMAL HIGH (ref 65–99)
Glucose-Capillary: 358 mg/dL — ABNORMAL HIGH (ref 65–99)
Glucose-Capillary: 288 mg/dL — ABNORMAL HIGH (ref 65–99)
Glucose-Capillary: 317 mg/dL — ABNORMAL HIGH (ref 65–99)

## 2015-12-25 LAB — CBC
HCT: 34.5 % — ABNORMAL LOW (ref 39.0–52.0)
HEMOGLOBIN: 10.7 g/dL — AB (ref 13.0–17.0)
MCH: 24.4 pg — ABNORMAL LOW (ref 26.0–34.0)
MCHC: 31 g/dL (ref 30.0–36.0)
MCV: 78.8 fL (ref 78.0–100.0)
PLATELETS: 252 10*3/uL (ref 150–400)
RBC: 4.38 MIL/uL (ref 4.22–5.81)
RDW: 16.9 % — ABNORMAL HIGH (ref 11.5–15.5)
WBC: 11.1 10*3/uL — AB (ref 4.0–10.5)

## 2015-12-25 LAB — BASIC METABOLIC PANEL
ANION GAP: 11 (ref 5–15)
BUN: 49 mg/dL — ABNORMAL HIGH (ref 6–20)
CALCIUM: 8.9 mg/dL (ref 8.9–10.3)
CHLORIDE: 96 mmol/L — AB (ref 101–111)
CO2: 27 mmol/L (ref 22–32)
CREATININE: 1.9 mg/dL — AB (ref 0.61–1.24)
GFR calc non Af Amer: 36 mL/min — ABNORMAL LOW (ref >60)
GFR, EST AFRICAN AMERICAN: 41 mL/min — AB (ref >60)
Glucose, Bld: 302 mg/dL — ABNORMAL HIGH (ref 65–99)
Potassium: 4.7 mmol/L (ref 3.5–5.1)
SODIUM: 134 mmol/L — AB (ref 135–145)

## 2015-12-25 LAB — CK: Total CK: 747 U/L — ABNORMAL HIGH (ref 49–397)

## 2015-12-25 LAB — OCCULT BLOOD X 1 CARD TO LAB, STOOL: FECAL OCCULT BLD: POSITIVE — AB

## 2015-12-25 MED ORDER — FENTANYL CITRATE (PF) 100 MCG/2ML IJ SOLN
50.0000 ug | Freq: Three times a day (TID) | INTRAMUSCULAR | Status: DC | PRN
  Administered 2015-12-25 – 2015-12-27 (×6): 50 ug via INTRAVENOUS
  Filled 2015-12-25 (×6): qty 2

## 2015-12-25 MED ORDER — INSULIN REGULAR HUMAN (CONC) 500 UNIT/ML ~~LOC~~ SOPN
20.0000 [IU] | PEN_INJECTOR | Freq: Three times a day (TID) | SUBCUTANEOUS | Status: DC
  Administered 2015-12-25 – 2015-12-26 (×5): 20 [IU] via SUBCUTANEOUS

## 2015-12-25 MED ORDER — INSULIN DETEMIR 100 UNIT/ML ~~LOC~~ SOLN
30.0000 [IU] | Freq: Every day | SUBCUTANEOUS | Status: DC
  Administered 2015-12-25: 30 [IU] via SUBCUTANEOUS
  Filled 2015-12-25 (×2): qty 0.3

## 2015-12-25 MED ORDER — INSULIN REGULAR HUMAN (CONC) 500 UNIT/ML ~~LOC~~ SOPN: 40.0000 [IU] | PEN_INJECTOR | Freq: Three times a day (TID) | SUBCUTANEOUS | Status: DC

## 2015-12-25 NOTE — Progress Notes (Signed)
Orthopedic Tech Progress Note Patient Details:  [name] September 19, 1950 [id number]  Ortho Devices Type of Ortho Device: Postop shoe/boot Ortho Device/Splint Location: RLE Ortho Device/Splint Interventions: Ordered, Application   Jennye Moccasin 12/25/2015, 3:06 PM

## 2015-12-25 NOTE — Progress Notes (Signed)
Spoke with patient about plan to increase levemir instead of increasing humulin at this time. He said he is not surprised his sugars have been in his home range despite receiving reduced humulin dose, as he says he is "nocompliant at home" with diet and drinks a lot of pepsi and has to "chase" his sugars. Looking towards discharge to SNF, plan is to titrate up levemir based on SSI needs. Patient says he would prefer not to be on concentrated insulin, so goal would be to switch over to novolog as able.

## 2015-12-25 NOTE — Clinical Social Work Note (Addendum)
CSW provided bed offers. He is deciding between [facility], [facility], and [facility]. Patient does not know anything about facilities so CSW offered to have hospital liasons come speak with him from [facility] and [facility]. CSW send text messages to both.   Dayton Scrape, [place] [phone]  Provided updated list of SNF's that patient was accepted at. Also accepted at [facility], [facility], and [facility]. Chris (filling in for [place], hospital liason at [facility] and [facility]) came by to visit patient and tell him about [facility]. Patient states that his sister wants to tour facilities either today or tomorrow after work. CSW emphasized importance of choosing a facility as soon as possible. Discussed need for a plan when discharge order is placed due to insurance reasons. Patient expressed understanding.  Dayton Scrape, [place] [phone]  3:31 pm Harriet Pho, hospital liason from [facility] met with patient and discussed facility.  Dayton Scrape, [place]

## 2015-12-25 NOTE — Consult Note (Signed)
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 male who presents with non union of 5th MT fracture, denies any trauma, denies any pain  Past Medical History  Diagnosis Date  . Hypertension   . Chronic diastolic CHF (congestive heart failure) (HCC)   . CAD (coronary artery disease)     a. s/p PCI to RCA in 2012. b. prior cath in 01/2014 with elevated L/RH pressures, mild-mod CAD of LAD/LCx with patent RCA, normal EF, c/b CIN/CHF.  [name] PAF (paroxysmal atrial fibrillation) (HCC)     TEE DCCV 09/23/2014  . Anemia   . Pilonidal cyst 1980's; 01/25/2013  . Neuropathy (HCC)   . History of blood transfusion ~ 1954    "related to OR"  . Carpal tunnel syndrome, bilateral   . Morbid obesity (HCC)   . Hyperlipidemia   . OSA (obstructive sleep apnea)     "I wear nasal prongs; haven't been using prongs recently" (09/19/2014)  . Type II diabetes mellitus (HCC)   . Arthritis     "hands and lower back" (09/19/2014)  . Chronic lower back pain   . Chronic kidney disease (CKD), stage III (moderate)   . Cellulitis   . Hypoxia     a. Qualified for home O2 at [place] in 09/2014.  [name] Physical deconditioning    Past Surgical History  Procedure Laterality Date  . Abdominal surgery  ~ 1954    BENIGN TUMOR REMOVED  . Debridement  foot Left     debriding diabetic foot ulcers  . Cataract extraction w/phaco Right 11/15/2012    Procedure: CATARACT EXTRACTION PHACO AND INTRAOCULAR LENS PLACEMENT (IOC);  Surgeon: Shade Flood, MD;  Location: [facility] OR;  Service: Ophthalmology;  Laterality: Right;  . Cataract extraction w/phaco Left 11/29/2012    Procedure: CATARACT EXTRACTION PHACO AND INTRAOCULAR LENS PLACEMENT (IOC);  Surgeon: Shade Flood, MD;  Location: [facility] OR;  Service: Ophthalmology;  Laterality: Left;  . Pilonidal cyst excision N/A 01/08/2013    Procedure: CYST EXCISION PILONIDAL  EXTENSIVE;  Surgeon: Axel Filler, MD;  Location: [facility] OR;  Service: General;  Laterality: N/A;  . Foreign body removal Right 2014    heel,  splinter removal   . Pars plana vitrectomy Left 06/05/2013    Procedure: PARS PLANA VITRECTOMY WITH 23 GAUGE with Endolaser(constellation);  Surgeon: Shade Flood, MD;  Location: [facility] OR;  Service: Ophthalmology;  Laterality: Left;  . Left and right heart catheterization with coronary angiogram N/A 01/31/2014    Procedure: LEFT AND RIGHT HEART CATHETERIZATION WITH CORONARY ANGIOGRAM;  Surgeon: Micheline Chapman, MD;  Location: [facility] CATH LAB;  Service: Cardiovascular;  Laterality: N/A;  . Tonsillectomy    . Appendectomy    . Eye surgery    . Coronary angioplasty with stent placement  August 2012    RCA DES - [facility]  . Cardioversion  2010    [name] 09/19/2014  . Pilonidal cyst excision  1980's    "in [place]"  . Tee without cardioversion N/A 09/23/2014    Procedure: TRANSESOPHAGEAL ECHOCARDIOGRAM (TEE);  Surgeon: Lewayne Bunting, MD;  Location: [facility] ENDOSCOPY;  Service: Cardiovascular;  Laterality: N/A;  . Cardioversion N/A 09/23/2014    Procedure: CARDIOVERSION;  Surgeon: Lewayne Bunting, MD;  Location: [facility] ENDOSCOPY;  Service: Cardiovascular;  Laterality: N/A;  . Cardioversion N/A 12/11/2014    Procedure: CARDIOVERSION;  Surgeon: Thurmon Fair,  MD;  Location: [facility] ENDOSCOPY;  Service: Cardiovascular;  Laterality: N/A;   Social History   Social History  . Marital Status: Single    Spouse Name: N/A  . Number of Children: Y  . Years of Education: N/A   Occupational History  . disabled truck driver    Social History Main Topics  . Smoking status: Former Smoker -- 1.00 packs/day for 20 years    Types: Cigarettes    Quit date: 07/13/1983  . Smokeless tobacco: Never Used  . Alcohol Use: No  . Drug Use: No  . Sexual Activity: Not Currently   Other Topics Concern  . None   Social History Narrative   Pt is single.    Lives with sister.   Has children.   Was adopted.   Daily cafffiene-2 cups of coffee and 2 sodas per day         Family History  Problem Relation Age of Onset  . Adopted: Yes  . Other Other     PT ADOPTED   - negative except otherwise stated in the family history section No Known Allergies Prior to Admission medications   Medication Sig Start Date End Date Taking? Authorizing Provider  amiodarone (PACERONE) 200 MG tablet Take 200 mg by mouth daily. 08/29/15  Yes Historical Provider, MD  furosemide (LASIX) 80 MG tablet Take 1 tablet (80 mg total) by mouth 2 (two) times daily. 11/20/15  Yes Dwana Melena, PA-C  gabapentin (NEURONTIN) 300 MG capsule Take 2 capsules (600 mg total) by mouth 2 (two) times daily. 09/15/15  Yes Richarda Overlie, MD  Hypromellose (NATURAL BALANCE TEARS OP) Place 1 drop into both eyes daily as needed (for dry eyes).    Yes Historical Provider, MD  Insulin Detemir (LEVEMIR FLEXTOUCH) 100 UNIT/ML Pen Inject 20 Units into the skin daily. 07/10/15  Yes [name], MD  insulin regular human CONCENTRATED (HUMULIN R) 500 UNIT/ML injection Inject 0.04 mLs (20 Units total) into the skin 3 (three) times daily before meals. 11/11/15  Yes [name], MD  lisinopril (PRINIVIL,ZESTRIL) 10 MG tablet Take 1 tablet (10 mg total) by mouth daily. 09/29/15  Yes Richarda Overlie, MD  metolazone (ZAROXOLYN) 5 MG tablet Take 1 tablet (5 mg total) by mouth daily. TAKE 30 MINUTES BEFORE TAKING LASIX 11/20/15  Yes Dwana Melena, PA-C  metoprolol (LOPRESSOR) 50 MG tablet Take 50 mg by mouth 2 (two) times daily. TWO TABLETS TWICE A DAY   Yes Historical Provider, MD  Multiple Vitamin (MULTIVITAMIN WITH MINERALS) TABS Take 1 tablet by mouth daily.   Yes Historical Provider, MD  oxyCODONE 10 MG TABS Take 1 tablet (10 mg total) by mouth every 6 (six) hours as needed for severe pain. Patient taking differently: Take 20 mg by mouth every 6 (six) hours as needed for severe pain.  09/15/15  Yes Richarda Overlie,  MD  potassium chloride SA (KLOR-CON M20) 20 MEQ tablet Take 1 tablet (20 mEq total) by mouth daily. 10/15/15  Yes [name], MD  rosuvastatin (CRESTOR) 40 MG tablet Take 40 mg by mouth daily.   Yes Historical Provider, MD  XARELTO 20 MG TABS tablet TAKE 1 TABLET (20 MG TOTAL) BY MOUTH DAILY WITH SUPPER. 09/15/15  Yes Lewayne Bunting, MD   Dg Thoracic Spine 2 View  12/23/2015  CLINICAL DATA:  Fall with mid thoracic and lower back pain. Initial encounter. EXAM: THORACIC SPINE 2 VIEWS COMPARISON:  07/08/2015 chest x-ray FINDINGS: Bony detail is suboptimal due to  patient size. No convincing fracture or subluxation. T1 vertebra incompletely visualized, but reportedly distant from the patient's mid and lower back pain. Multilevel spondylosis and degenerative disc narrowing, greatest at the lower thoracic levels. IMPRESSION: 1. No suspected fracture.  No subluxation. 2. Spondylosis and degenerative disc narrowing. 3. Sensitivity limited by patient size. Electronically Signed   By: Marnee Spring M.D.   On: 12/23/2015 16:49   Dg Lumbar Spine Complete  12/23/2015  CLINICAL DATA:  Fall with lower back pain.  Initial encounter. EXAM: LUMBAR SPINE - COMPLETE 4+ VIEW COMPARISON:  07/11/2015 FINDINGS: No evidence of acute fracture or traumatic malalignment. Spondylosis. Mild to moderate L1-2 and L2-3 degenerative disc narrowing. Extensive arterial calcification. Bony detail diminished by patient size. IMPRESSION: No acute finding. Electronically Signed   By: Marnee Spring M.D.   On: 12/23/2015 16:51   Dg Ankle Complete Left  12/23/2015  CLINICAL DATA:  Pt states his knees gave out yesterday evening, and he fell, pains both feet and ankles, and both hips, but due to pt weight of over 400 lbs, he was unable to get up, and laid there for 11 hours, so he is stiff, unable to move much or hold his legs in position, he states he has neuropathy, so feeling in his feet is decreased, he states no pains, Either foot or  ankle, pains laterally both hips at the greater trochanter, which was there before the fall EXAM: LEFT ANKLE COMPLETE - 3+ VIEW COMPARISON:  None. FINDINGS: No fracture.  The ankle mortise is normally spaced and aligned. Soft tissues are unremarkable other than arterial vascular calcifications. IMPRESSION: No fracture or dislocation.  No acute finding. Electronically Signed   By: Amie Portland M.D.   On: 12/23/2015 17:16   Dg Ankle Complete Right  12/23/2015  CLINICAL DATA:  Pt states his knees gave out yesterday evening, and he fell, pains both feet and ankles, and both hips, but due to pt weight of over 400 lbs, he was unable to get up, and laid there for 11 hours, so he is stiff, unable to move much or hold his legs in position, he states he has neuropathy, so feeling in his feet is decreased, he states no pains, Either foot or ankle, pains laterally both hips at the greater trochanter, which was there before the fall EXAM: RIGHT ANKLE - COMPLETE 3+ VIEW COMPARISON:  None. FINDINGS: There is a fracture across the proximal metaphysis of fifth metatarsal. This appears to be a chronic ununited fracture. CAD is better visualized on the current right foot radiographs. No evidence of an ankle fracture. The ankle mortise is normally spaced and aligned. No significant arthropathic change. There are small calcaneal spurs. Calcifications noted in the ankle and foot arteries. IMPRESSION: 1. No ankle fracture dislocation. No significant ankle joint arthropathic change. 2. Fracture of the proximal fifth metatarsal metaphysis which appears to be a chronic ununited fracture. Electronically Signed   By: Amie Portland M.D.   On: 12/23/2015 14:28   Ct Head Wo Contrast  12/23/2015  CLINICAL DATA:  Status post fall today. The patient is anticoagulated. EXAM: CT HEAD WITHOUT CONTRAST TECHNIQUE: Contiguous axial images were obtained from the base of the skull through the vertex without intravenous contrast. COMPARISON:  None.  FINDINGS: There is some cortical atrophy and chronic microvascular ischemic change. No evidence of acute intracranial abnormality including hemorrhage, infarct, mass lesion, mass effect, midline shift or abnormal extra-axial fluid collection is seen. No hydrocephalus or pneumocephalus. The calvarium is intact.  Right mastoid effusion is identified. IMPRESSION: No acute abnormality. Mild atrophy and chronic microvascular ischemic change. Right mastoid effusion. Electronically Signed   By: Drusilla Kanner M.D.   On: 12/23/2015 14:40   Dg Foot Complete Left  12/23/2015  CLINICAL DATA:  The patient's knees gave way yesterday resulting in a fall and left foot injury. Pain. Initial encounter. EXAM: LEFT FOOT - COMPLETE 3+ VIEW COMPARISON:  None. FINDINGS: There is no evidence of fracture or dislocation. There is no evidence of arthropathy or other focal bone abnormality. Soft tissues demonstrate atherosclerosis. IMPRESSION: No acute abnormality. Atherosclerosis. Electronically Signed   By: Drusilla Kanner M.D.   On: 12/23/2015 14:28   Dg Foot Complete Right  12/23/2015  CLINICAL DATA:  Fall yesterday.  Pain EXAM: RIGHT FOOT COMPLETE - 3+ VIEW COMPARISON:  None. FINDINGS: Transverse fracture base of fifth metatarsal with sclerotic margins. This appears to be a chronic fracture without bony union. Correlate with history and symptoms in this area. Widening of the space between the first and second metatarsals and medial and middle cuneiform suggesting Lisfranc injury. This may be chronic. Correlate with symptoms in this area. No acute fracture identified. Arterial calcification IMPRESSION: Chronic fracture of the fifth metatarsal without bony union Probable Lisfranc injury. This may be chronic. Correlate with symptoms in this area. Electronically Signed   By: Marlan Palau M.D.   On: 12/23/2015 14:27   Dg Hips Bilat With Pelvis 3-4 Views  12/23/2015  CLINICAL DATA:  Pt states his knees gave out yesterday evening,  and he fell, pains both feet and ankles, and both hips, but due to pt weight of over 400 lbs, he was unable to get up, and laid there for 11 hours, so he is stiff, unable to move much or hold his legs in position, he states he has neuropathy, so feeling in his feet is decreased, he states no pains, Either foot or ankle, pains laterally both hips at the greater trochanter, which was there before the fall EXAM: DG HIP (WITH OR WITHOUT PELVIS) 3-4V BILAT COMPARISON:  None. FINDINGS: No fracture.  No bone lesion. Hip joints, SI joints and symphysis pubis are normally spaced and aligned. Soft tissues are unremarkable. IMPRESSION: 1. No fracture or dislocation.  No acute finding. Electronically Signed   By: Amie Portland M.D.   On: 12/23/2015 14:29   - pertinent xrays, CT, MRI studies were reviewed and independently interpreted  Positive ROS: All other systems have been reviewed and were otherwise negative with the exception of those mentioned in the HPI and as above.  Physical Exam: General: Alert, no acute distress Cardiovascular: No pedal edema Respiratory: No cyanosis, no use of accessory musculature GI: No organomegaly, abdomen is soft and non-tender Skin: No lesions in the area of chief complaint Neurologic: does not have protective sensation Psychiatric: Patient is competent for consent with normal mood and affect Lymphatic: No axillary or cervical lymphadenopathy  MUSCULOSKELETAL:  Good DP pulse, no cellulitis, no tenderness to palpation right foot, radiograph shows non union of fracture base 5th MT, no lisfranc injury, there is no charcot arthropathy  Assessment: 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  Thank you for the consult and the opportunity to see Mr. Aston Lieske, MD [facility]  Orthopedics [phone] 7:46 AM

## 2015-12-25 NOTE — Progress Notes (Signed)
MEDICATION RELATED CONSULT NOTE - INITIAL   Pharmacy Consult for Regular Insulin CONCENTRATED 500 units/ml  (HUMULIN R 500 UNITS/ML  INDICATION: to verify dosage of concentrated insulin product   No Known Allergies   Assessment: I spoke to the patient on admit date 6/14 The patient initially reported that he take 20 units of Insulin regular CONCENTRATED 500 units/ml. Upon further questioning he states that his dose is 100 units TIDWC of theHumulin 500unit/ml CONCENTRATED regular insulin product. -He uses the insulin U-100 SYRINGE to draw up his dose from the Insulin regular 500 UNIT/ML vial. Thus he draws dose up to the 20 unit mark (0.69ml mark) which equals 100 units of U-500 Insulin. Since this is a syringe whose measurements are designed for the 100 unit/ml product you must multiply the 20 unit mark x 5 to get the correct dose of 100 units of the 500 units/ml insulin product.    (ie. If he used the same syringe to draw up regular insulin 100 unit/ml, drawn up to 20 unit mark this would = 20 units.  But using the same syringe,  drawing up a dose from the 500 units/ml insulin product, the 20 unit MARK on this syringe is equivalent to (20 unit mark x 5 =100 units of the 500 unit/ml concentrated insulin).   I also reviewed EPIC outpatient office visit notes and PTA medication history. PTA medication list has dose as Insulin Regular CONCENTRATED 500 unit/ml, Dose = 100 unit total (or 0.39ml)  BIDWC on 07/08/15 From April -June 2016 the dose is 90-120 units TIDWC of the Insulin Regular CONCENTRATED 500 unit/ml, Recently he saw a new physician practice on 10/14/15. I supsect that  since he reports his dose as "20 units" TID referring to the mark on the U-100 insulin syringe , the refill prescription was electronically sent to [facility] pharmacy as 20 units (0.04 ml)  Insulin regular human concentrated (Humulin R) 500 unit/mL, inject 20 units (0.04 ml) SQ TID before meals. When dose should have been recorded as  100 units TID AC.   I spoke to the patient again this morning and he confirms  the above info that the actual dose is 100 units TID of the concentrated 500 unit/ml regular insulin.  He states he knows that the insulin 500 units/ml is "5x a strong as the 100 unit Insulin and he assumed we knew this".   I discussed these findings via phone with Dr. Sampson Goon. She will adjust insulin dosage as needed  Thank you for allowing pharmacy to be part of this patients care team. [name], RPh Clinical Pharmacist Pager: [phone] 12/25/2015,5:18 PM

## 2015-12-25 NOTE — Consult Note (Signed)
[facility] Consult: 1:45 PM 12/25/2015  LOS: 2 days    Referring Provider: Dr Lum Babe  Primary Care Physician:  [name], MD Primary Gastroenterologist:  unassigned     Reason for Consultation:  Anemia, BPR.    HPI: [name] is a 65 y.o. male.  Hx diastolic CHF.  DM2.   Morbid obesity. HTN.  OSA. CAD.  CAD stent 2012. MI.  Rheumatoid arthritis.  CKD III.  Afib, on Xarelto at home. Diabetic neuropathy.  09/2015 scrotal wall abscess.  Long standing microcytic anemia (dates to at least 2013  but pt did not follow up for Colonoscopy or EGD in 2013 09/2015 CT abd and pelvis with contrast: multiple borderline enlarged and minimally enlarged lymph nodes in the abdomen, pelvis and lower middle mediastinum, nonspecific, but correlation for signs and symptoms of lymphoproliferative disorder is suggested... Apparent thickening and edema of the wall the gallbladder. The gallbladder is relatively decompressed on today's examination, and there are no gallstones identified. This makes the possibility of cholecystitis highly unlikely. His LFTs were normal.   Fell at home.  Sustained right 5th metatarsal fx but this was news to him as he had no new pain, just his usual diabetic neuropathic pain.  This will be managed with "WBAT". Ruled in for Rhabdo and AKI.   Glucose in 400s at arrival. CK to 3871.  No Troponins.    Hgb now 10.7, as low as 9.7 in 09/2015, up to 12. 8 on 4/4//17.  MCV to 77.  FOBT + 6/13 and 6/15.    Has chronic drainage from region of 2014 pilonidal cyst excision.  Sometimes drainage is bloody or pinkish.  This can be painful.  Stools are brown and daily.  No heartburn, no dysphagia.  Remote blood transfusions.  No unusual bleeding or bruising.  No blood in urine.  Glucoses run 40 to 400s at home.    Uses oxycodone and no NSAIDs for pain control at home.     Past Medical History  Diagnosis Date  . Hypertension   . Chronic diastolic CHF (congestive heart failure) (HCC)   . CAD (coronary artery disease)     a. s/p PCI to RCA in 2012. b. prior cath in 01/2014 with elevated L/RH pressures, mild-mod CAD of LAD/LCx with patent RCA, normal EF, c/b CIN/CHF.  [name] PAF (paroxysmal atrial fibrillation) (HCC)     TEE DCCV 09/23/2014  . Anemia   . Pilonidal cyst 1980's; 01/25/2013  . Neuropathy (HCC)   . History of blood transfusion ~ 1954    "related to OR"  . Carpal tunnel syndrome, bilateral   . Morbid obesity (HCC)   . Hyperlipidemia   . OSA (obstructive sleep apnea)     "I wear nasal prongs; haven't been using prongs recently" (09/19/2014)  . Type II diabetes mellitus (HCC)   . Arthritis     "hands and lower back" (09/19/2014)  . Chronic lower back pain   . Chronic kidney disease (CKD), stage III (moderate)   . Cellulitis   . Hypoxia  a. Qualified for home O2 at [facility] in 09/2014.  [name] Physical deconditioning     Past Surgical History  Procedure Laterality Date  . Abdominal surgery  ~ 1954    BENIGN TUMOR REMOVED  . Debridement  foot Left     debriding diabetic foot ulcers  . Cataract extraction w/phaco Right 11/15/2012    Procedure: CATARACT EXTRACTION PHACO AND INTRAOCULAR LENS PLACEMENT (IOC);  Surgeon: Shade Flood, MD;  Location: [facility] OR;  Service: Ophthalmology;  Laterality: Right;  . Cataract extraction w/phaco Left 11/29/2012    Procedure: CATARACT EXTRACTION PHACO AND INTRAOCULAR LENS PLACEMENT (IOC);  Surgeon: Shade Flood, MD;  Location: [facility] OR;  Service: Ophthalmology;  Laterality: Left;  . Pilonidal cyst excision N/A 01/08/2013    Procedure: CYST EXCISION PILONIDAL EXTENSIVE;  Surgeon: Axel Filler, MD;  Location: [facility] OR;  Service: General;  Laterality: N/A;  . Foreign body removal Right 2014    heel,  splinter removal   . Pars plana vitrectomy Left 06/05/2013    Procedure:  PARS PLANA VITRECTOMY WITH 23 GAUGE with Endolaser(constellation);  Surgeon: Shade Flood, MD;  Location: [facility] OR;  Service: Ophthalmology;  Laterality: Left;  . Left and right heart catheterization with coronary angiogram N/A 01/31/2014    Procedure: LEFT AND RIGHT HEART CATHETERIZATION WITH CORONARY ANGIOGRAM;  Surgeon: Micheline Chapman, MD;  Location: [facility] CATH LAB;  Service: Cardiovascular;  Laterality: N/A;  . Tonsillectomy    . Appendectomy    . Eye surgery    . Coronary angioplasty with stent placement  August 2012    RCA DES - [facility]  . Cardioversion  2010    [name] 09/19/2014  . Pilonidal cyst excision  1980's    "in [place]"  . Tee without cardioversion N/A 09/23/2014    Procedure: TRANSESOPHAGEAL ECHOCARDIOGRAM (TEE);  Surgeon: Lewayne Bunting, MD;  Location: [facility] ENDOSCOPY;  Service: Cardiovascular;  Laterality: N/A;  . Cardioversion N/A 09/23/2014    Procedure: CARDIOVERSION;  Surgeon: Lewayne Bunting, MD;  Location: [facility] ENDOSCOPY;  Service: Cardiovascular;  Laterality: N/A;  . Cardioversion N/A 12/11/2014    Procedure: CARDIOVERSION;  Surgeon: Thurmon Fair, MD;  Location: [facility] ENDOSCOPY;  Service: Cardiovascular;  Laterality: N/A;    Prior to Admission medications   Medication Sig Start Date End Date Taking? Authorizing Provider  amiodarone (PACERONE) 200 MG tablet Take 200 mg by mouth daily. 08/29/15  Yes Historical Provider, MD  furosemide (LASIX) 80 MG tablet Take 1 tablet (80 mg total) by mouth 2 (two) times daily. 11/20/15  Yes Dwana Melena, PA-C  gabapentin (NEURONTIN) 300 MG capsule Take 2 capsules (600 mg total) by mouth 2 (two) times daily. 09/15/15  Yes Richarda Overlie, MD  Hypromellose (NATURAL BALANCE TEARS OP) Place 1 drop into both eyes daily as needed (for dry eyes).    Yes Historical Provider, MD  Insulin Detemir (LEVEMIR FLEXTOUCH) 100 UNIT/ML Pen Inject 20 Units into the skin daily. 07/10/15  Yes [name], MD  insulin regular human  CONCENTRATED (HUMULIN R) 500 UNIT/ML injection Inject 0.04 mLs (20 Units total) into the skin 3 (three) times daily before meals. 11/11/15  Yes [name], MD  lisinopril (PRINIVIL,ZESTRIL) 10 MG tablet Take 1 tablet (10 mg total) by mouth daily. 09/29/15  Yes Richarda Overlie, MD  metolazone (ZAROXOLYN) 5 MG tablet Take 1 tablet (5 mg total) by mouth daily. TAKE 30 MINUTES BEFORE TAKING LASIX 11/20/15  Yes Dwana Melena, PA-C  metoprolol (LOPRESSOR) 50 MG tablet Take  50 mg by mouth 2 (two) times daily. TWO TABLETS TWICE A DAY   Yes Historical Provider, MD  Multiple Vitamin (MULTIVITAMIN WITH MINERALS) TABS Take 1 tablet by mouth daily.   Yes Historical Provider, MD  oxyCODONE 10 MG TABS Take 1 tablet (10 mg total) by mouth every 6 (six) hours as needed for severe pain. Patient taking differently: Take 20 mg by mouth every 6 (six) hours as needed for severe pain.  09/15/15  Yes Richarda Overlie, MD  potassium chloride SA (KLOR-CON M20) 20 MEQ tablet Take 1 tablet (20 mEq total) by mouth daily. 10/15/15  Yes [name], MD  rosuvastatin (CRESTOR) 40 MG tablet Take 40 mg by mouth daily.   Yes Historical Provider, MD  XARELTO 20 MG TABS tablet TAKE 1 TABLET (20 MG TOTAL) BY MOUTH DAILY WITH SUPPER. 09/15/15  Yes Lewayne Bunting, MD    Scheduled Meds: . amiodarone  200 mg Oral Daily  . atorvastatin  80 mg Oral Daily  . gabapentin  600 mg Oral BID  . hydrocerin   Topical Daily  . insulin aspart  0-20 Units Subcutaneous TID WC  . insulin aspart  0-5 Units Subcutaneous QHS  . insulin detemir  30 Units Subcutaneous QHS  . insulin regular human CONCENTRATED  20 Units Subcutaneous TID AC  . metoprolol  50 mg Oral BID  . mupirocin cream   Topical BID  . rivaroxaban  20 mg Oral Q supper  . sodium chloride flush  3 mL Intravenous Q12H   Infusions:   PRN Meds: acetaminophen **OR** acetaminophen, fentaNYL (SUBLIMAZE) injection, oxyCODONE, polyethylene glycol, polyvinyl alcohol   Allergies as of  12/23/2015  . (No Known Allergies)    Family History  Problem Relation Age of Onset  . Adopted: Yes  . Other Other     PT ADOPTED    Social History   Social History  . Marital Status: Single    Spouse Name: N/A  . Number of Children: Y  . Years of Education: N/A   Occupational History  . disabled truck driver    Social History Main Topics  . Smoking status: Former Smoker -- 1.00 packs/day for 20 years    Types: Cigarettes    Quit date: 07/13/1983  . Smokeless tobacco: Never Used  . Alcohol Use: No  . Drug Use: No  . Sexual Activity: Not Currently   Other Topics Concern  . Not on file   Social History Narrative   Pt is single.   Lives with sister.   Has children.   Was adopted.   Daily cafffiene-2 cups of coffee and 2 sodas per day          REVIEW OF SYSTEMS: See HPI.  Pt sedentary.  Sleeps in lounger.  SOB when flat.  Weight fluctuates depending on fluid status   PHYSICAL EXAM: Vital signs in last 24 hours: Filed Vitals:   12/25/15 0605 12/25/15 0958  BP: 129/71 97/47  Pulse: 93 108  Temp: 97.9 F (36.6 C) 98.7 F (37.1 C)  Resp: 18 18   Wt Readings from Last 3 Encounters:  12/25/15 170.598 kg (376 lb 1.6 oz)  11/20/15 180.985 kg (399 lb)  10/14/15 172.82 kg (381 lb)    General: morbidly obese,  Looks unwell Head:  No asymmetry or signs trauma  Eyes:  No icterus or pallor Ears:  Not HOH  Nose:  No congestion or discharge Mouth:  Edentulous, clear and moist Neck:  No mass,  Massive  double chin, unable to appreciate TMG, or JVD Lungs:  Clear but diminished  Heart: irreg, irreg, rate not accelerated Abdomen:  Obese with ventral diastasis recti vs hernia.  Not tender, no HSM or mass.  No bruits.   Rectal: crusting in region of pilonidal cystectomy.  Firm tissue in buttocks, c/w dependent edema   Musc/Skeltl: some abrasions on both feet.   Extremities:  + non- pitting pedal/LE edema  Neurologic:  Oriented x 3.  No tremor.  Moving all 4 limbs,  strength not tested.   Skin:  Coloring ashen.   Psych:  Pleasant, cooperative.   Intake/Output from previous day: 06/14 0701 - 06/15 0700 In: 1420 [P.O.:1420] Out: 3801 [Urine:3800; Stool:1] Intake/Output this shift: Total I/O In: 225 [P.O.:222; I.V.:3] Out: 1200 [Urine:1200]  LAB RESULTS:  Recent Labs  12/23/15 1310 12/24/15 0331 12/25/15 0556  WBC 17.3* 13.9* 11.1*  HGB 10.4* 9.8* 10.7*  HCT 32.9* 31.7* 34.5*  PLT 235 236 252   BMET Lab Results  Component Value Date   NA 134* 12/25/2015   NA 135 12/24/2015   NA 133* 12/23/2015   K 4.7 12/25/2015   K 5.2* 12/24/2015   K 6.0* 12/23/2015   CL 96* 12/25/2015   CL 99* 12/24/2015   CL 95* 12/23/2015   CO2 27 12/25/2015   CO2 26 12/24/2015   CO2 23 12/23/2015   GLUCOSE 302* 12/25/2015   GLUCOSE 423* 12/24/2015   GLUCOSE 445* 12/23/2015   BUN 49* 12/25/2015   BUN 71* 12/24/2015   BUN 87* 12/23/2015   CREATININE 1.90* 12/25/2015   CREATININE 2.56* 12/24/2015   CREATININE 3.12* 12/23/2015   CALCIUM 8.9 12/25/2015   CALCIUM 8.8* 12/24/2015   CALCIUM 8.9 12/23/2015   LFT  Recent Labs  12/23/15 1310 12/24/15 0331  PROT 6.6 6.7  ALBUMIN 3.1* 3.0*  AST 71* 44*  ALT 20 18  ALKPHOS 53 49  BILITOT 1.7* 1.2   PT/INR Lab Results  Component Value Date   INR 2.08* 07/09/2015   INR 2.45* 07/08/2015   INR 5.73* 04/10/2015   PROTIME 15.8 12/25/2008   Hepatitis Panel No results for input(s): HEPBSAG, HCVAB, HEPAIGM, HEPBIGM in the last 72 hours. C-Diff No components found for: CDIFF Lipase  No results found for: LIPASE  Drugs of Abuse     Component Value Date/Time   LABOPIA NEG 04/07/2009 2129   COCAINSCRNUR NEG 04/07/2009 2129   LABBENZ NEG 04/07/2009 2129   AMPHETMU NEG 04/07/2009 2129     RADIOLOGY STUDIES: Dg Thoracic Spine 2 View  12/23/2015  CLINICAL DATA:  Fall with mid thoracic and lower back pain. Initial encounter. EXAM: THORACIC SPINE 2 VIEWS COMPARISON:  07/08/2015 chest x-ray FINDINGS:  Bony detail is suboptimal due to patient size. No convincing fracture or subluxation. T1 vertebra incompletely visualized, but reportedly distant from the patient's mid and lower back pain. Multilevel spondylosis and degenerative disc narrowing, greatest at the lower thoracic levels. IMPRESSION: 1. No suspected fracture.  No subluxation. 2. Spondylosis and degenerative disc narrowing. 3. Sensitivity limited by patient size. Electronically Signed   By: Marnee Spring M.D.   On: 12/23/2015 16:49   Dg Lumbar Spine Complete  12/23/2015  CLINICAL DATA:  Fall with lower back pain.  Initial encounter. EXAM: LUMBAR SPINE - COMPLETE 4+ VIEW COMPARISON:  07/11/2015 FINDINGS: No evidence of acute fracture or traumatic malalignment. Spondylosis. Mild to moderate L1-2 and L2-3 degenerative disc narrowing. Extensive arterial calcification. Bony detail diminished by patient size. IMPRESSION: No acute finding. Electronically  Signed   By: Marnee Spring M.D.   On: 12/23/2015 16:51   Dg Ankle Complete Left  12/23/2015  CLINICAL DATA:  Pt states his knees gave out yesterday evening, and he fell, pains both feet and ankles, and both hips, but due to pt weight of over 400 lbs, he was unable to get up, and laid there for 11 hours, so he is stiff, unable to move much or hold his legs in position, he states he has neuropathy, so feeling in his feet is decreased, he states no pains, Either foot or ankle, pains laterally both hips at the greater trochanter, which was there before the fall EXAM: LEFT ANKLE COMPLETE - 3+ VIEW COMPARISON:  None. FINDINGS: No fracture.  The ankle mortise is normally spaced and aligned. Soft tissues are unremarkable other than arterial vascular calcifications. IMPRESSION: No fracture or dislocation.  No acute finding. Electronically Signed   By: Amie Portland M.D.   On: 12/23/2015 17:16   Dg Ankle Complete Right  12/23/2015  CLINICAL DATA:  Pt states his knees gave out yesterday evening, and he fell,  pains both feet and ankles, and both hips, but due to pt weight of over 400 lbs, he was unable to get up, and laid there for 11 hours, so he is stiff, unable to move much or hold his legs in position, he states he has neuropathy, so feeling in his feet is decreased, he states no pains, Either foot or ankle, pains laterally both hips at the greater trochanter, which was there before the fall EXAM: RIGHT ANKLE - COMPLETE 3+ VIEW COMPARISON:  None. FINDINGS: There is a fracture across the proximal metaphysis of fifth metatarsal. This appears to be a chronic ununited fracture. CAD is better visualized on the current right foot radiographs. No evidence of an ankle fracture. The ankle mortise is normally spaced and aligned. No significant arthropathic change. There are small calcaneal spurs. Calcifications noted in the ankle and foot arteries. IMPRESSION: 1. No ankle fracture dislocation. No significant ankle joint arthropathic change. 2. Fracture of the proximal fifth metatarsal metaphysis which appears to be a chronic ununited fracture. Electronically Signed   By: Amie Portland M.D.   On: 12/23/2015 14:28   Ct Head Wo Contrast  12/23/2015  CLINICAL DATA:  Status post fall today. The patient is anticoagulated. EXAM: CT HEAD WITHOUT CONTRAST TECHNIQUE: Contiguous axial images were obtained from the base of the skull through the vertex without intravenous contrast. COMPARISON:  None. FINDINGS: There is some cortical atrophy and chronic microvascular ischemic change. No evidence of acute intracranial abnormality including hemorrhage, infarct, mass lesion, mass effect, midline shift or abnormal extra-axial fluid collection is seen. No hydrocephalus or pneumocephalus. The calvarium is intact. Right mastoid effusion is identified. IMPRESSION: No acute abnormality. Mild atrophy and chronic microvascular ischemic change. Right mastoid effusion. Electronically Signed   By: Drusilla Kanner M.D.   On: 12/23/2015 14:40   Dg  Foot Complete Left  12/23/2015  CLINICAL DATA:  The patient's knees gave way yesterday resulting in a fall and left foot injury. Pain. Initial encounter. EXAM: LEFT FOOT - COMPLETE 3+ VIEW COMPARISON:  None. FINDINGS: There is no evidence of fracture or dislocation. There is no evidence of arthropathy or other focal bone abnormality. Soft tissues demonstrate atherosclerosis. IMPRESSION: No acute abnormality. Atherosclerosis. Electronically Signed   By: Drusilla Kanner M.D.   On: 12/23/2015 14:28   Dg Foot Complete Right  12/23/2015  CLINICAL DATA:  Fall yesterday.  Pain  EXAM: RIGHT FOOT COMPLETE - 3+ VIEW COMPARISON:  None. FINDINGS: Transverse fracture base of fifth metatarsal with sclerotic margins. This appears to be a chronic fracture without bony union. Correlate with history and symptoms in this area. Widening of the space between the first and second metatarsals and medial and middle cuneiform suggesting Lisfranc injury. This may be chronic. Correlate with symptoms in this area. No acute fracture identified. Arterial calcification IMPRESSION: Chronic fracture of the fifth metatarsal without bony union Probable Lisfranc injury. This may be chronic. Correlate with symptoms in this area. Electronically Signed   By: Marlan Palau M.D.   On: 12/23/2015 14:27   Dg Hips Bilat With Pelvis 3-4 Views  12/23/2015  CLINICAL DATA:  Pt states his knees gave out yesterday evening, and he fell, pains both feet and ankles, and both hips, but due to pt weight of over 400 lbs, he was unable to get up, and laid there for 11 hours, so he is stiff, unable to move much or hold his legs in position, he states he has neuropathy, so feeling in his feet is decreased, he states no pains, Either foot or ankle, pains laterally both hips at the greater trochanter, which was there before the fall EXAM: DG HIP (WITH OR WITHOUT PELVIS) 3-4V BILAT COMPARISON:  None. FINDINGS: No fracture.  No bone lesion. Hip joints, SI joints and  symphysis pubis are normally spaced and aligned. Soft tissues are unremarkable. IMPRESSION: 1. No fracture or dislocation.  No acute finding. Electronically Signed   By: Amie Portland M.D.   On: 12/23/2015 14:29    ENDOSCOPIC STUDIES: None ever  IMPRESSION:   *  Chronic anemia.  Microcytic.  Given multiple medical problems including CKD 3 to 4 this is multifactorial.  Stool is FOBT +.  Non worrisome GI ROS.   *  S/p 12/2012 pilonidal cyst excision.  Chronic drainage, sometimes bloody from this area.    *   Multiple comorbidities as per HPI.    *  Left foot fracture, painless in pt with diabetic sensate neuropathy.  This appears old    PLAN:     *  Per Dr Adela Lank.    Jennye Moccasin  12/25/2015, 1:45 PM Pager: [phone]

## 2015-12-25 NOTE — Progress Notes (Signed)
Family Medicine Teaching Service Daily Progress Note Intern Pager: [phone]  Patient name: [name] Medical record number: [id number] Date of birth: 1951-03-22 Age: 65 y.o. Gender: male  Primary Care Provider: [name], MD Consultants: Orthopedic Surgery Code Status: DNR  Pt Overview and Major Events to Date:  6/13: Admitted after a fall  Assessment and Plan: Paul MCLAWHORN is a 65 y.o. male presenting with back pain after fall. PMH is significant for chronic diastolic CHF, CAD (s/p PCI to RCA in 2012, prior cath in 01/2014 with elevated L/RH pressures, mild-mod CAD of LAD/LCx with patent RCA, normal EF, c/b CIN/CHF), morbid obesity, paroxysmal atrial fibrillation, OSA (unable to tolerate CPAP), DM, HLD, anemia, CKD stage III, deconditioning.   #Back pain after fall: History of falls, with an admission in December 2016 in setting of afib with RVR. Does have history of chronic back pain and LE neuropathy with numbness. Believe neuropathy contributes to recurrent falls as patient unable to feel his feet on exam. Imaging unremarkable except for right proximal metatarsal fracture likely chronic with possible Lisfranc injury. -admit to telemetry, Attending Dr. Lum Babe -obtain lumbar and thoracic spine xrays -ordered home oxycodone 20 mg q6h prn (had 3 doses over last 24 hours) -fentanyl 50 mg q4h prn (required 4 doses over last 16 hours) --> reduce frequency of prn dosing to q8h prn -PT/OT consult --> SNF, 3-in-1 bedside commode -ortho consult placed, appreciate recommendations --> Reviewed R foot xray and noted 5th MT fracture wtihout lisfranc fracture or charcot collapse. Recommended WBAT right foot with post op shoe or supportive walking shoe (no sandals or bare foot ambulation). -fall precautions  #Rhabodomyolysis. Resolved. Due to fall with prolonged lie. CK 3871. Denies muscle aches. Moderate hemoglobin on UA.  -s/p 2L fluid in ED -trend CK --> 2163 --> 747 (12/25/15) -Stop  IV hydration   #AoCKD: SCr 1.90, improved from SCr of 3.12 on admission. Baseline SCr ~1.5. Likely ATN in setting of rhabdomyolysis. Pre-renal contribution as well with BUN of 87.  -daily BMPs -avoid nephrotoxic agents; holding home lasix and metolazone -I/Os --> -2.4 L  #Hyperkalemia: Improved from 6.0 to 5.2 (6/14). EKG without peaked T waves, though low voltage due to patient habitus.  -hold potassium chloride supplement -insulin regimen ordered -Discontinue telemetry -daily BMP  #T2DM. A1c 10/2015 8.7. Takes Levemir 20U daily and Humulin R 20U TID with meals. Contradictory prescribing history and patient account of actual dose to be taken 100 u vs 20 u. (Please see prescription history below compiled by student pharmacity Chales Abrahams). CBGs elevated to 450 in the ED. Anion gap of 15 > 10 > 11. Ketones on UA but in setting of dehydration. Monitor closely for development of DKA. Last CBGs 369 > 398 > 414 > 298 > 317 > 287 this a.m.  -Restarted home dosing of medication 6/14; will increase levemir to 30 u nightly and up-titrate -SSI, resistant scale --> received 56 units novolog in addition to home meds (+ additional dose of levemir) this a.m. -qAC/HS CBGs Prescription history  07/10/15- Levemir (100u/ml) - 20 units daily   11/11/15- Humulin (500u/ml) - 20 units TID before meals   10/14/15 - 11/11/15 Humulin (500u/ml) - 20 units TID before meals   09/15/15 - 10/14/15 Humulin (500u/ml) - 80 units TID before meals   12/11/14 - 09/15/15  Humulin (500u/ml) - 100 units BID with a meal  (patient took 100 units TID before meals)   08/19/13 - 02/12/14 Humulin (500u/ml) - 100 units TID  with meals   09/10/12 - 11/10/12 Lantus (insulin glargine) (100u/ml) - 30 units AM, 70 units PM    Chart review  11/20/2015 Levemir 20 units daily  Humulin (500u/ml) - 20 units TID before meals   10/14/2015 Levemir (100u/ml) - 20 units daily  Per patient, he is taking 20 units of Humulin (500u/ml) - 20 units. H  12/04/2014 Humulin  (500u/ml) - 90-120 units TID with meals   03/22/2014 Humulin (500u/ml) - 90 units TID with meals   02/08/2014 Humulin (500u/ml) - 100 units TID with meals   06/14/2013 Novolog (100u/ml) - 25 units TID with meals Lantus (100u/ml) - 100 units BID    #CHF. Echo 11/2014 EF 50-55% but with LVH. Treatment aimed at controlling LE edema and dyspnea on exertion. No signs of fluid overload on exam (no LE edema, lung CTAB). No evidence of fluid overload on today's exam despite fluid administration. -hold lasix 80 mg BID for AKI -hold metolazone 5 mg daily for AKI -continue metoprolol 100 mg BID -Monitor fluid status. -daily weights --> 379 > 376 > 376  #Paroxysmal A.fib: In afib on exam. HR <100.  -continue Xarelto  -continue amiodarone 200 mg daily and metoprolol 100 mg BID  #HTN: Soft pressures on admission. 100-129/44-71. -continue home metoprolol -hold lisinopril in setting of AKI  #CAD/HLD -switched from crestor to atorvastatin due to renal function -on xarelto  #Leukocytosis: 11.1 < 13.9, down from 17.3 on admission. Afebrile on presentation.  -monitor with daily CBCs  #OSA.  -Uses 2L of O2 at home -CPAP ordered for hospitalization --> refused overnight  #Microcytic anemia: Stable at 10.7. Baseline ~ 10. Denies dark stools, BRB in stool to this examiner but reported 3 month history of BRB in stools to Attending physician. Does have trouble with constipation.  -Obtain iron studies --> Iron WNL -FOBT positive in ED --> repeat 6/15 also positive -PCP had recommended scoping for last several years; would require inpatient procedure due to risk factors of afib and morbid obesity -Will consult GI for possible inpatient scope  FEN/GI: Heart Healthy/Carb Modified Diet, SLIV Prophylaxis: on xarelto  Disposition: Admit for pain control and assessment of mobility, as well as possible GI work-up pending consult.   Subjective:  Patient still with back and hip pain, improved with use of IV  fentanyl.  Objective: Temp:  [97.8 F (36.6 C)-97.9 F (36.6 C)] 97.9 F (36.6 C) (06/15 0605) Pulse Rate:  [76-93] 93 (06/15 0605) Resp:  [18] 18 (06/15 0605) BP: (100-129)/(44-71) 129/71 mmHg (06/15 0605) SpO2:  [94 %-99 %] 99 % (06/15 0605) Weight:  [376 lb 1.6 oz (170.598 kg)] 376 lb 1.6 oz (170.598 kg) (06/15 1025) Physical Exam: General: Morbidly obese male, lying in bed Neck: Supple, could not assess for JVD due to habitus Cardiovascular: Irregularly irregular, S1, S2, no m/r/g Respiratory: CTAB, moving air well throughout  Abdomen: +BS, soft, ND, no TTP MSK: LE strength 5-/5 on the left and 5-/5 on the right. He can wiggle his toes. Abrasions across both feet. No LE edema.  Skin: Very dry skin across feet, bruised toenails.  Neuro: AOx3. Decreased sensation to touch to ankle of R LE and to mid-shin to L LE.  Psych: Appropriate mood and affect.   Laboratory:  Recent Labs Lab 12/23/15 1310 12/24/15 0331 12/25/15 0556  WBC 17.3* 13.9* 11.1*  HGB 10.4* 9.8* 10.7*  HCT 32.9* 31.7* 34.5*  PLT 235 236 252    Recent Labs Lab 12/23/15 1310 12/24/15 0331 12/25/15 0556  NA 133* 135 134*  K 6.0* 5.2* 4.7  CL 95* 99* 96*  CO2 23 26 27   BUN 87* 71* 49*  CREATININE 3.12* 2.56* 1.90*  CALCIUM 8.9 8.8* 8.9  PROT 6.6 6.7  --   BILITOT 1.7* 1.2  --   ALKPHOS 53 49  --   ALT 20 18  --   AST 71* 44*  --   GLUCOSE 445* 423* 302*   Imaging/Diagnostic Tests: No results found.  , MD 12/25/2015, 9:37 AM PGY-1, [facility] FPTS Intern pager: [phone], text pages welcome

## 2015-12-26 ENCOUNTER — Other Ambulatory Visit: Payer: Self-pay

## 2015-12-26 ENCOUNTER — Telehealth: Payer: Self-pay

## 2015-12-26 DIAGNOSIS — D649 Anemia, unspecified: Secondary | ICD-10-CM

## 2015-12-26 DIAGNOSIS — R195 Other fecal abnormalities: Secondary | ICD-10-CM

## 2015-12-26 LAB — BASIC METABOLIC PANEL
Anion gap: 11 (ref 5–15)
Anion gap: 9 (ref 5–15)
BUN: 33 mg/dL — AB (ref 6–20)
BUN: 33 mg/dL — ABNORMAL HIGH (ref 6–20)
CALCIUM: 9.2 mg/dL (ref 8.9–10.3)
CHLORIDE: 98 mmol/L — AB (ref 101–111)
CO2: 24 mmol/L (ref 22–32)
CO2: 27 mmol/L (ref 22–32)
CREATININE: 1.8 mg/dL — AB (ref 0.61–1.24)
Calcium: 9.6 mg/dL (ref 8.9–10.3)
Chloride: 97 mmol/L — ABNORMAL LOW (ref 101–111)
Creatinine, Ser: 1.66 mg/dL — ABNORMAL HIGH (ref 0.61–1.24)
GFR calc Af Amer: 49 mL/min — ABNORMAL LOW (ref >60)
GFR calc non Af Amer: 42 mL/min — ABNORMAL LOW (ref >60)
GFR, EST AFRICAN AMERICAN: 44 mL/min — AB (ref >60)
GFR, EST NON AFRICAN AMERICAN: 38 mL/min — AB (ref >60)
Glucose, Bld: 371 mg/dL — ABNORMAL HIGH (ref 65–99)
Glucose, Bld: 396 mg/dL — ABNORMAL HIGH (ref 65–99)
POTASSIUM: 4.5 mmol/L (ref 3.5–5.1)
Potassium: 4.4 mmol/L (ref 3.5–5.1)
SODIUM: 133 mmol/L — AB (ref 135–145)
SODIUM: 133 mmol/L — AB (ref 135–145)

## 2015-12-26 LAB — GLUCOSE, CAPILLARY
GLUCOSE-CAPILLARY: 349 mg/dL — AB (ref 65–99)
GLUCOSE-CAPILLARY: 349 mg/dL — AB (ref 65–99)
Glucose-Capillary: 345 mg/dL — ABNORMAL HIGH (ref 65–99)
Glucose-Capillary: 405 mg/dL — ABNORMAL HIGH (ref 65–99)
Glucose-Capillary: 292 mg/dL — ABNORMAL HIGH (ref 65–99)

## 2015-12-26 LAB — CBC
HCT: 35.3 % — ABNORMAL LOW (ref 39.0–52.0)
HEMOGLOBIN: 11.2 g/dL — AB (ref 13.0–17.0)
MCH: 24.9 pg — AB (ref 26.0–34.0)
MCHC: 31.7 g/dL (ref 30.0–36.0)
MCV: 78.4 fL (ref 78.0–100.0)
Platelets: 262 10*3/uL (ref 150–400)
RBC: 4.5 MIL/uL (ref 4.22–5.81)
RDW: 16.4 % — ABNORMAL HIGH (ref 11.5–15.5)
WBC: 13.1 10*3/uL — ABNORMAL HIGH (ref 4.0–10.5)

## 2015-12-26 MED ORDER — INSULIN DETEMIR 100 UNIT/ML ~~LOC~~ SOLN
30.0000 [IU] | Freq: Two times a day (BID) | SUBCUTANEOUS | Status: DC
  Administered 2015-12-26 – 2015-12-27 (×2): 30 [IU] via SUBCUTANEOUS
  Filled 2015-12-26 (×3): qty 0.3

## 2015-12-26 MED ORDER — INSULIN REGULAR HUMAN (CONC) 500 UNIT/ML ~~LOC~~ SOPN
50.0000 [IU] | PEN_INJECTOR | Freq: Three times a day (TID) | SUBCUTANEOUS | Status: DC
  Administered 2015-12-27 (×2): 50 [IU] via SUBCUTANEOUS

## 2015-12-26 MED ORDER — INSULIN ASPART 100 UNIT/ML ~~LOC~~ SOLN: 20.0000 [IU] | Freq: Three times a day (TID) | SUBCUTANEOUS | Status: DC

## 2015-12-26 MED ORDER — INSULIN ASPART 100 UNIT/ML ~~LOC~~ SOLN: 0.0000 [IU] | Freq: Three times a day (TID) | SUBCUTANEOUS | Status: DC

## 2015-12-26 MED ORDER — POLYETHYLENE GLYCOL 3350 17 G PO PACK: 17.0000 g | PACK | Freq: Every day | ORAL | Status: DC | PRN

## 2015-12-26 MED ORDER — HYDROCERIN EX CREA: 1.0000 "application " | TOPICAL_CREAM | Freq: Every day | CUTANEOUS | Status: DC

## 2015-12-26 MED ORDER — INSULIN DETEMIR 100 UNIT/ML ~~LOC~~ SOLN: 10.0000 [IU] | Freq: Every morning | SUBCUTANEOUS | Status: DC

## 2015-12-26 MED ORDER — ATORVASTATIN CALCIUM 80 MG PO TABS: 80.0000 mg | ORAL_TABLET | Freq: Every day | ORAL | Status: DC

## 2015-12-26 MED ORDER — INSULIN ASPART 100 UNIT/ML ~~LOC~~ SOLN
20.0000 [IU] | Freq: Once | SUBCUTANEOUS | Status: AC
  Administered 2015-12-26: 20 [IU] via SUBCUTANEOUS

## 2015-12-26 MED ORDER — MUPIROCIN CALCIUM 2 % EX CREA: TOPICAL_CREAM | Freq: Two times a day (BID) | CUTANEOUS | Status: DC

## 2015-12-26 MED ORDER — OXYCODONE HCL 20 MG PO TABS: 20.0000 mg | ORAL_TABLET | Freq: Four times a day (QID) | ORAL | Status: DC | PRN

## 2015-12-26 MED ORDER — INSULIN DETEMIR 100 UNIT/ML ~~LOC~~ SOLN: 30.0000 [IU] | Freq: Every day | SUBCUTANEOUS | Status: DC

## 2015-12-26 MED ORDER — INSULIN DETEMIR 100 UNIT/ML ~~LOC~~ SOLN
10.0000 [IU] | Freq: Once | SUBCUTANEOUS | Status: AC
  Administered 2015-12-26: 10 [IU] via SUBCUTANEOUS
  Filled 2015-12-26: qty 0.1

## 2015-12-26 NOTE — Progress Notes (Signed)
Patient continues to decline nocturnal CPAP. RT will continue to follow.  

## 2015-12-26 NOTE — Clinical Social Work Note (Signed)
CSW unable to wait on MD to decide on whether patient is discharging or not due blood sugar of 405. Transport paperwork is complete. Will give PTAR number and information to give them to RN. RN to call report prior to discharge: [phone].  Charlynn Court, CSW [phone]

## 2015-12-26 NOTE — Progress Notes (Signed)
1730 placed a call to MD re cbg 600 and insulin to cover as charted

## 2015-12-26 NOTE — Clinical Social Work Note (Addendum)
Patient has chosen [facility] and will need PTAR when he leaves.  Charlynn Court, CSW [phone]  12:44 pm SNF has bariatric bed ready for patient.  Charlynn Court, CSW [phone]

## 2015-12-26 NOTE — Progress Notes (Signed)
Family Medicine Teaching Service Daily Progress Note Intern Pager: [phone]  Patient name: [name] Medical record number: [id number] Date of birth: Sep 17, 1950 Age: 65 y.o. Gender: male  Primary Care Provider: [name], MD Consultants: Orthopedic Surgery Code Status: DNR  Pt Overview and Major Events to Date:  6/13: Admitted after a fall  Assessment and Plan: [name] is a 65 y.o. male presenting with back pain after fall. PMH is significant for chronic diastolic CHF, CAD (s/p PCI to RCA in 2012, prior cath in 01/2014 with elevated L/RH pressures, mild-mod CAD of LAD/LCx with patent RCA, normal EF, c/b CIN/CHF), morbid obesity, paroxysmal atrial fibrillation, OSA (unable to tolerate CPAP), DM, HLD, anemia, CKD stage III, deconditioning.   #Back pain after fall: History of falls, with an admission in December 2016 in setting of afib with RVR. Does have history of chronic back pain and LE neuropathy with numbness. Believe neuropathy contributes to recurrent falls as patient unable to feel his feet on exam. Imaging unremarkable except for right proximal metatarsal fracture, that appears to be chronic in nature. -Lumbar and thoracic spine xrays with degenerative changes -Continue home oxycodone 20 mg q6h prn  -fentanyl 50 mg q8h prn -PT/OT consult --> SNF, 3-in-1 bedside commode -ortho consult placed, appreciate recommendations --> Reviewed R foot xray and noted 5th MT fracture wtihout lisfranc fracture or charcot collapse. Recommended WBAT right foot with post op shoe or supportive walking shoe (no sandals or bare foot ambulation). -fall precautions  #Rhabodomyolysis. Resolved. Due to fall with prolonged lie. CK 3871. Denies muscle aches. Moderate hemoglobin on UA.  -s/p 2L fluid in ED -trend CK --> 2163 --> 747 (12/25/15) -Stop IV hydration   #AoCKD: SCr 1.66, improved from SCr of 3.12 on admission. Baseline SCr ~1.5. Likely ATN in setting of rhabdomyolysis. Pre-renal  contribution as well with BUN of 87.  -daily BMPs -avoid nephrotoxic agents; holding home lasix and metolazone -I/Os --> -2.4 L  #Hyperkalemia: Improved from 6.0 to 4.5 (6/15). EKG without peaked T waves, though low voltage due to patient habitus.  -hold potassium chloride supplement -insulin regimen ordered -daily BMP  #T2DM. A1c 10/2015 8.7. Takes Levemir 20U daily and Humulin R 20U TID with meals. Contradictory prescribing history and patient account of actual dose to be taken 100 u vs 20 u. (Please see prescription history below compiled by student pharmacity Chales Abrahams). CBGs elevated to 450 in the ED. Anion gap of 15 > 10 > 11. Ketones on UA but in setting of dehydration. Monitor closely for development of DKA. Last CBGs 317 > 345 > 349 > 405 this afternoon.  -Restarted home dosing of medication 6/14; will increase levemir to 10 units in the morning and 30 u nightly and up-titrate according to novolog needed -SSI, resistant scale --> received 35 units novolog in addition to home meds yesterday -qAC/HS CBGs Prescription history  07/10/15- Levemir (100u/ml) - 20 units daily   11/11/15- Humulin (500u/ml) - 20 units TID before meals   10/14/15 - 11/11/15 Humulin (500u/ml) - 20 units TID before meals   09/15/15 - 10/14/15 Humulin (500u/ml) - 80 units TID before meals   12/11/14 - 09/15/15  Humulin (500u/ml) - 100 units BID with a meal  (patient took 100 units TID before meals)   08/19/13 - 02/12/14 Humulin (500u/ml) - 100 units TID with meals   09/10/12 - 11/10/12 Lantus (insulin glargine) (100u/ml) - 30 units AM, 70 units PM    Chart review  11/20/2015 Levemir 20  units daily  Humulin (500u/ml) - 20 units TID before meals   10/14/2015 Levemir (100u/ml) - 20 units daily  Per patient, he is taking 20 units of Humulin (500u/ml) - 20 units. H  12/04/2014 Humulin (500u/ml) - 90-120 units TID with meals   03/22/2014 Humulin (500u/ml) - 90 units TID with meals   02/08/2014 Humulin (500u/ml) - 100 units TID with meals    06/14/2013 Novolog (100u/ml) - 25 units TID with meals Lantus (100u/ml) - 100 units BID    #CHF. Echo 11/2014 EF 50-55% but with LVH. Treatment aimed at controlling LE edema and dyspnea on exertion. No signs of fluid overload on exam (no LE edema, lung CTAB). No evidence of fluid overload on today's exam despite fluid administration. -hold lasix 80 mg BID for AKI -hold metolazone 5 mg daily for AKI -continue metoprolol 100 mg BID -Monitor fluid status. -daily weights --> 379 > 376 > 376 > 372  #Paroxysmal A.fib: In afib on exam. HR <100.  -continue Xarelto  -continue amiodarone 200 mg daily and metoprolol 100 mg BID  #HTN: Soft pressures on admission. 100-129/44-71. -continue home metoprolol -hold lisinopril in setting of AKI  #CAD/HLD -switched from crestor to atorvastatin due to renal function -on xarelto  #Leukocytosis: 13.1 < 11.1 < 13.9, down from 17.3 on admission. Afebrile on presentation.  -monitor with daily CBCs  #OSA.  -Uses 2L of O2 at home intermittently -CPAP ordered for hospitalization --> refused overnight  #Microcytic anemia: Stable at 11.2. Baseline ~ 10. Denies dark stools, BRB in stool to this examiner but reported 3 month history of BRB in stools to Attending physician. Does have trouble with constipation.  -Obtain iron studies --> Iron WNL -FOBT positive in ED --> repeat 6/15 also positive -PCP had recommended scoping for last several years; would require inpatient procedure due to risk factors of afib and morbid obesity -GI consulted and recommends EGD and colonoscopy arranged outpatient but with actual procedure in hospital for anesthesia  FEN/GI: Heart Healthy/Carb Modified Diet, SLIV Prophylaxis: on xarelto  Disposition: Anticipated discharge today but blood sugars elevated this p.m.  Subjective:  Patient still with back pain. Hip pain improved from yesterday.   Objective: Temp:  [97.7 F (36.5 C)-98.7 F (37.1 C)] 97.7 F (36.5 C) (06/16  1407) Pulse Rate:  [69-78] 69 (06/16 1407) Resp:  [18-21] 18 (06/16 1407) BP: (110-122)/(41-85) 110/41 mmHg (06/16 1407) SpO2:  [94 %-97 %] 94 % (06/16 1407) Weight:  [372 lb 14.4 oz (169.146 kg)] 372 lb 14.4 oz (169.146 kg) (06/16 0550) Physical Exam: General: Morbidly obese male, lying in bed Neck: Supple, could not assess for JVD due to habitus Cardiovascular: Irregularly irregular, S1, S2, no m/r/g Respiratory: CTAB, moving air well throughout  Abdomen: +BS, soft, ND, no TTP MSK: LE strength 5-/5 on the left and 5-/5 on the right. He can wiggle his toes. Abrasions across both feet. No LE edema.  Skin: Very dry skin across feet, bruised toenails.  Neuro: AOx3. Decreased sensation to touch to ankle of R LE and to mid-shin to L LE.  Psych: Appropriate mood and affect.   Laboratory:  Recent Labs Lab 12/24/15 0331 12/25/15 0556 12/26/15 1043  WBC 13.9* 11.1* 13.1*  HGB 9.8* 10.7* 11.2*  HCT 31.7* 34.5* 35.3*  PLT 236 252 262    Recent Labs Lab 12/23/15 1310 12/24/15 0331 12/25/15 0556 12/26/15 1043  NA 133* 135 134* 133*  K 6.0* 5.2* 4.7 4.5  CL 95* 99* 96* 98*  CO2 23  26 27 24   BUN 87* 71* 49* 33*  CREATININE 3.12* 2.56* 1.90* 1.66*  CALCIUM 8.9 8.8* 8.9 9.6  PROT 6.6 6.7  --   --   BILITOT 1.7* 1.2  --   --   ALKPHOS 53 49  --   --   ALT 20 18  --   --   AST 71* 44*  --   --   GLUCOSE 445* 423* 302* 371*   Imaging/Diagnostic Tests: No results found.  , MD 12/26/2015, 5:38 PM PGY-1, [facility] FPTS Intern pager: [phone], text pages welcome

## 2015-12-26 NOTE — Discharge Summary (Signed)
Family Medicine Teaching [facility] Discharge Summary  Patient name: [name] Medical record number: [id number] Date of birth: [birth year] Age: 65 y.o. Gender: male Date of Admission: 12/23/2015  Date of Discharge: 12/26/2015 Admitting Physician: Doreene Eland, MD  Primary Care Provider: [name], MD Consultants: Orthopedic Surgery, Wound Care, Gastroenterology  Indication for Hospitalization: pain after fall, AKI  Discharge Diagnoses/Problem List:  Active Problems:   Acute kidney injury    Back pain   Rhabdomyolysis   Foot fracture (chronic)   Type 2 diabetes mellitus with complication, with long-term current use of insulin    Lower GI bleed  Disposition: SNF  Discharge Condition: Improved  Discharge Exam:  Blood pressure 110/41, pulse 69, temperature 97.7 F (36.5 C), temperature source Oral, resp. rate 18, height 6' (1.829 m), weight 372 lb 14.4 oz (169.146 kg), SpO2 94 %. Physical Exam: General: Morbidly obese male, sitting up in recliner Cardiovascular: Irregularly irregular, S1, S2, no m/r/g Respiratory: CTAB, moving air well throughout  Abdomen: +BS, soft, ND, no TTP MSK: Able to lean forward with minimal pain. TTP over SI joints. LE strength 5-/5 on the left and 5-/5 on the right. He can wiggle his toes. Abrasions across both feet. No LE edema.  Skin: Very dry skin across feet, bruised toenails.  Neuro: AOx3. Decreased sensation to touch to ankle of R LE and to mid-shin to L LE.  Psych: Appropriate mood and affect.   Brief Hospital Course:  [name] is a 65 y.o. male who presented with back pain and hip pain after fall. PMH is significant for chronic diastolic CHF, CAD (s/p PCI to RCA in 2012, prior cath in 01/2014 with elevated L/RH pressures, mild-mod CAD of LAD/LCx with patent RCA, normal EF, c/b CIN/CHF), morbid obesity, paroxysmal atrial fibrillation, OSA (unable to tolerate CPAP), DM, HLD, anemia, CKD stage III, LE neuropathy and  numbness, and deconditioning.   Back pain after fall:  Imaging of hips, lumbar and thoracic spine, and feet unremarkable except for right proximal metatarsal fracture likely chronic with possible Lisfranc injury, which was evaluated by Orthopedic Surgery and determined to be a 5th MT fracture without lisfranc fracture or charcot collapse. Patient was given home oxycodone 20 mg q6h as needed and IV fentanyl for breakthrough pain. Pain and mobility improved at time of discharge. Physical Therapy and Occupational Therapy recommended SNF placement for improved mobility and 24-hour supervision.   Rhabdomyolysis:  Patient had prolonged lie after fall. CK was 3871 on admission, and there was moderate hemoglobin on UA. He was given IVFs until CK was below 1000.  Acute on Chronic Kidney Disease: Likely ATN in setting of rhabdomyolysis. Pre-renal contribution as well with BUN of 87. Initially held home lasix and metolazone. Near baseline (~1.5) at discharge with SCr of 1.66.   T2DM: Patient's last A1c was 8.7 on 10/2015. He reports taking Levemir 20U daily and Humulin R 100 units TID with meals, which he says he requires at home to "chase his sugars" as he is nonadherent to carb-modified diet and drinks a lot of Pepsi at home. CBGs were elevated to 450 in the ED. Anion gap was watched closely but never developed greater than 15. He had ketones on UA but in setting of dehydration. His levemir was increased to 10 units in the morning and 30 units in the evening, based on sliding scale insulin needed in addition to 20 U of Humulin (lower dose given due to contradictory prescription history and preference to not  bottom patient's CBGs out). Patient expressed desire to resume novolog instead of humulin, as he felt he had better control of his sugars and less confusing of a regimen on novolog in the past. He will be discharged on levemir 10 units in the morning and 30 units in the evening and 20 units novolog TID with  meals with recommendation to continue sliding scale insulin (resistant scale) to determine how to adjust long-acting insulin and hopefully reduce short-acting insulin needs in the future.  Hyperkalemia: To 6.0 on admission. K 4.5 upon discharge. EKGs without peaked T waves. Resolved with resumption of insulin therapy (patient had had lapse after fall).   Microcytic anemia:  Stable at 11.2 upon discharge (Baseline ~ 10). Denies dark stools, BRB in stool to this examiner but reported 3 month history of BRB in stools to Attending physician. FOBT positive x 2. Gastroenterology was consulted for possible inpatient EGD and colonoscopy, as had previously been requested by patient's PCP, but these procedures will be performed outpatient (though in the hospital for anesthesia support) and scheduled in the near future, as patient's hemoglobin is stable. Will continue xarelto for patient's atrial fibrillation upon discharge.   Chronic Right Foot Fracture: Orthopedic Surgery recommended weight-bearing as tolerated to right foot with post op shoe or supportive walking shoe (no sandals or bare foot ambulation). Post-op shoe provided, but patient prefers his own tennis shoes.   Wound Care: Patient with abrasions across feet after fall and chronic full thickness wound of upper buttock after sebaceous cyst drained. Leave buttock and toe wounds open to air, applying eucerin cream to crusted areas of skin as needed and bactroban ointment to healing toe wounds.   CAD/HLD: Patient's crestor was switched to atorvastatin due to renal function.   CHF: No evidence of fluid overload on exam. Continue home lasix and metolazone upon discharge.   Issues for Follow Up:  1. Continue to titrate insulin regimen, with preference to increase levemir with lower doses of mealtime coverage. 2. Inquire about GI follow-up appointment for EGD and colonoscopy.  3. Recheck BMP to ensure resolution of AKI and to monitor potassium with  resumption of lasix, metolazone, and Kdur. 4. Monitor for soft blood pressures. Could decrease dose of metoprolol if persistent.   Significant Procedures: None  Significant Labs and Imaging:   Recent Labs Lab 12/24/15 0331 12/25/15 0556 12/26/15 1043  WBC 13.9* 11.1* 13.1*  HGB 9.8* 10.7* 11.2*  HCT 31.7* 34.5* 35.3*  PLT 236 252 262    Recent Labs Lab 12/23/15 1310 12/24/15 0331 12/25/15 0556 12/26/15 1043  NA 133* 135 134* 133*  K 6.0* 5.2* 4.7 4.5  CL 95* 99* 96* 98*  CO2 23 26 27 24   GLUCOSE 445* 423* 302* 371*  BUN 87* 71* 49* 33*  CREATININE 3.12* 2.56* 1.90* 1.66*  CALCIUM 8.9 8.8* 8.9 9.6  ALKPHOS 53 49  --   --   AST 71* 44*  --   --   ALT 20 18  --   --   ALBUMIN 3.1* 3.0*  --   --    Results/Tests Pending at Time of Discharge: None  Discharge Medications:    Medication List    STOP taking these medications        insulin regular human CONCENTRATED 500 UNIT/ML injection  Commonly known as:  HUMULIN R     rosuvastatin 40 MG tablet  Commonly known as:  CRESTOR      TAKE these medications  amiodarone 200 MG tablet  Commonly known as:  PACERONE  Take 200 mg by mouth daily.     atorvastatin 80 MG tablet  Commonly known as:  LIPITOR  Take 1 tablet (80 mg total) by mouth daily.     furosemide 80 MG tablet  Commonly known as:  LASIX  Take 1 tablet (80 mg total) by mouth 2 (two) times daily.     gabapentin 300 MG capsule  Commonly known as:  NEURONTIN  Take 2 capsules (600 mg total) by mouth 2 (two) times daily.     hydrocerin Crea  Apply 1 application topically daily.     insulin aspart 100 UNIT/ML injection  Commonly known as:  NOVOLOG  Inject 20 Units into the skin 3 (three) times daily with meals.     insulin aspart 100 UNIT/ML injection  Commonly known as:  novoLOG  Inject 0-20 Units into the skin 3 (three) times daily with meals.     insulin detemir 100 UNIT/ML injection  Commonly known as:  LEVEMIR  Inject 0.3 mLs (30  Units total) into the skin at bedtime.     insulin detemir 100 UNIT/ML injection  Commonly known as:  LEVEMIR  Inject 0.1 mLs (10 Units total) into the skin every morning.     lisinopril 10 MG tablet  Commonly known as:  PRINIVIL,ZESTRIL  Take 1 tablet (10 mg total) by mouth daily.     metolazone 5 MG tablet  Commonly known as:  ZAROXOLYN  Take 1 tablet (5 mg total) by mouth daily. TAKE 30 MINUTES BEFORE TAKING LASIX     metoprolol 50 MG tablet  Commonly known as:  LOPRESSOR  Take 50 mg by mouth 2 (two) times daily. TWO TABLETS TWICE A DAY     multivitamin with minerals Tabs tablet  Take 1 tablet by mouth daily.     mupirocin cream 2 %  Commonly known as:  BACTROBAN  Apply topically 2 (two) times daily.     NATURAL BALANCE TEARS OP  Place 1 drop into both eyes daily as needed (for dry eyes).     Oxycodone HCl 20 MG Tabs  Take 1 tablet (20 mg total) by mouth every 6 (six) hours as needed for severe pain.     polyethylene glycol packet  Commonly known as:  MIRALAX / GLYCOLAX  Take 17 g by mouth daily as needed for mild constipation.     potassium chloride SA 20 MEQ tablet  Commonly known as:  KLOR-CON M20  Take 1 tablet (20 mEq total) by mouth daily.     XARELTO 20 MG Tabs tablet  Generic drug:  rivaroxaban  TAKE 1 TABLET (20 MG TOTAL) BY MOUTH DAILY WITH SUPPER.        Discharge Instructions: Please refer to Patient Instructions section of EMR for full details.  Patient was counseled important signs and symptoms that should prompt return to medical care, changes in medications, dietary instructions, activity restrictions, and follow up appointments.   Follow-Up Appointments: Follow-up Information    Follow up with DUDA,MARCUS V, MD In 1 week.   Specialty:  Orthopedic Surgery   Contact information:   [address] [place] [address] [phone]       Follow up with [facility] .   Specialty:  Skilled Nursing Facility   Contact  information:   [address] [place] [address] [phone]      Casey Burkitt, MD 12/26/2015, 2:50 PM PGY-1, [facility]

## 2015-12-26 NOTE — Progress Notes (Signed)
Physical Therapy Treatment Patient Details Name: [name] MRN: [id number] DOB: 1951-06-07 Today's Date: 12/26/2015    History of Present Illness 65 Y/O M with Pmx of DM2, HLD, morbid obesity, HTN, OSA, CAD, HFpEF,CKD III, Afib presented with hx of fall more than 12 hours ago with leg pain upon trying to get up. Found to have AKI and rhabdomyolsis. Pt found to have fx of rt 5th metatarsal.    PT Comments    Pt making good progress. Post op shoe felt small to pt and he says he has a good pair of tennis shoes at home that her can wear.  Follow Up Recommendations  SNF     Equipment Recommendations  Other (comment) (Bariatric RW)    Recommendations for Other Services       Precautions / Restrictions Precautions Precautions: Fall Required Braces or Orthoses: Other Brace/Splint Other Brace/Splint: post-op shoe or supportive walking shoe on rt Restrictions Weight Bearing Restrictions: No RLE Weight Bearing: Weight bearing as tolerated    Mobility  Bed Mobility                  Transfers Overall transfer level: Needs assistance Equipment used: Rolling walker (2 wheeled) Transfers: Sit to/from Stand Sit to Stand: Min guard         General transfer comment: Assist for safety.  Ambulation/Gait Ambulation/Gait assistance: Min guard Ambulation Distance (Feet): 40 Feet Assistive device: Rolling walker (2 wheeled) Gait Pattern/deviations: Step-through pattern;Decreased step length - right;Decreased step length - left;Wide base of support Gait velocity: decr Gait velocity interpretation: Below normal speed for age/gender General Gait Details: Pt wearing post op shoe but pt felt like post op shoe was less stable.   Stairs            Wheelchair Mobility    Modified Rankin (Stroke Patients Only)       Balance Overall balance assessment: Needs assistance Sitting-balance support: No upper extremity supported;Feet supported Sitting balance-Leahy Scale:  Good     Standing balance support: Bilateral upper extremity supported Standing balance-Leahy Scale: Poor Standing balance comment: walker and supervision                    Cognition Arousal/Alertness: Awake/alert Behavior During Therapy: WFL for tasks assessed/performed Overall Cognitive Status: Within Functional Limits for tasks assessed                      Exercises      General Comments        Pertinent Vitals/Pain Pain Assessment: 0-10 Pain Score: 8  Pain Location: back Pain Descriptors / Indicators: Aching Pain Intervention(s): Limited activity within patient's tolerance;Monitored during session    Home Living                      Prior Function            PT Goals (current goals can now be found in the care plan section) Progress towards PT goals: Progressing toward goals    Frequency  Min 2X/week    PT Plan Current plan remains appropriate;Frequency needs to be updated    Co-evaluation             End of Session   Activity Tolerance: Patient tolerated treatment well Patient left: in chair;with call bell/phone within reach;with chair alarm set     Time: 6967-8938 PT Time Calculation (min) (ACUTE ONLY): 17 min  Charges:  $Gait Training: 8-22 mins  G Codes:      Harrel Ferrone 12/26/2015, 12:12 PM [facility] PT [phone]

## 2015-12-26 NOTE — Clinical Social Work Placement (Signed)
   CLINICAL SOCIAL WORK PLACEMENT  NOTE  Date:  12/26/2015  Patient Details  Name: [name] MRN: [id number] Date of Birth: 1950-08-04  Clinical Social Work is seeking post-discharge placement for this patient at the Skilled  Nursing Facility level of care (*CSW will initial, date and re-position this form in  chart as items are completed):  Yes   Patient/family provided with [facility] Clinical Social Work Department's list of facilities offering this level of care within the geographic area requested by the patient (or if unable, by the patient's family).  Yes   Patient/family informed of their freedom to choose among providers that offer the needed level of care, that participate in Medicare, Medicaid or managed care program needed by the patient, have an available bed and are willing to accept the patient.  Yes   Patient/family informed of [facility] ownership interest in [facility], as well as of the fact that they are under no obligation to receive care at these facilities.  PASRR submitted to EDS on 12/24/15     PASRR number received on 12/24/15     Existing PASRR number confirmed on       FL2 transmitted to all facilities in geographic area requested by pt/family on 12/24/15     FL2 transmitted to all facilities within larger geographic area on       Patient informed that his/her managed care company has contracts with or will negotiate with certain facilities, including the following:            Patient/family informed of bed offers received.  Patient chooses bed at [facility]     Physician recommends and patient chooses bed at      Patient to be transferred to [facility] on  .  Patient to be transferred to facility by [facility]     Patient family notified on   of transfer.  Name of family member notified:        PHYSICIAN       Additional Comment:    _______________________________________________ Margarito Liner, LCSW 12/26/2015, 4:21 PM

## 2015-12-26 NOTE — Telephone Encounter (Signed)
-----   Message from Ruffin Frederick, MD sent at 12/25/2015  5:54 PM EDT ----- [name] this patient I saw in the hospital and needs to see me in about a month or 6 weeks. Can you please tentatively save him a spot for an egd and colonoscopy to be done at [facility] in July or August, which ever spot is available for my half day, we can always reschedule him if needed. Thanks

## 2015-12-26 NOTE — Telephone Encounter (Signed)
Pt scheduled to see Dr. Adela Lank 02/04/16@9 :45am. Pt scheduled for ECL at [facility] 03/02/16@10am  with [facility] [phone]. Pt to be instructed at time of OV.

## 2015-12-26 NOTE — Care Management Important Message (Signed)
Important Message  Patient Details  Name: [name] MRN: [id number] Date of Birth: 1950/12/21   Medicare Important Message Given:  Yes    Plummer Matich, Stephan Minister 12/26/2015, 9:22 AM

## 2015-12-26 NOTE — Progress Notes (Signed)
CBG done 405 , no signs of  Hyperglycemia Will notify MD

## 2015-12-26 NOTE — Progress Notes (Signed)
Inpatient Diabetes Program Recommendations  AACE/ADA: New Consensus Statement on Inpatient Glycemic Control (2015)  Target Ranges:  Prepandial:   less than 140 mg/dL      Peak postprandial:   less than 180 mg/dL (1-2 hours)      Critically ill patients:  140 - 180 mg/dL   Lab Results  Component Value Date   GLUCAP 349* 12/26/2015   HGBA1C 8.7 10/14/2015  Results for JOSEALFREDO, ADKINS (MRN [id number]) as of 12/26/2015 13:32  Ref. Range 12/25/2015 17:29 12/25/2015 21:00 12/26/2015 05:48 12/26/2015 10:43 12/26/2015 11:22  Glucose-Capillary Latest Ref Range: 65-99 mg/dL 798 (H) 921 (H) 194 (H)  349 (H)   At home ptt takes 100 units of U-500 tid which he draws up from a vial on a U-100 syringe to the 20 unit mark (equal to 100 units tid) plus Levemir 20 units daily.  In the hospital he is receiving Levemir 20 units in the pm.(He was given an additional one time dose of 10 units this am) plus resistant correction scale tidac and hs.  He is also receiving 20 units of U-500 tid before meals  from the [facility] ( which is actually 20 units ).  During his last hospitalization in March 2017  he received 75 units of U-500 tid from the [facility] with good glycemic control.  Please consider increasing the U-500 dose to 75 units tid. Mellissa Kohut RD, CDE. M.Ed. Pager [phone] Inpatient Diabetes Coordinator

## 2015-12-27 ENCOUNTER — Inpatient Hospital Stay (HOSPITAL_COMMUNITY): Payer: Medicare Other

## 2015-12-27 DIAGNOSIS — N5089 Other specified disorders of the male genital organs: Secondary | ICD-10-CM

## 2015-12-27 LAB — BASIC METABOLIC PANEL
ANION GAP: 10 (ref 5–15)
BUN: 29 mg/dL — AB (ref 6–20)
CALCIUM: 9.5 mg/dL (ref 8.9–10.3)
CO2: 27 mmol/L (ref 22–32)
Chloride: 95 mmol/L — ABNORMAL LOW (ref 101–111)
Creatinine, Ser: 1.51 mg/dL — ABNORMAL HIGH (ref 0.61–1.24)
GFR, EST AFRICAN AMERICAN: 55 mL/min — AB (ref >60)
GFR, EST NON AFRICAN AMERICAN: 47 mL/min — AB (ref >60)
GLUCOSE: 315 mg/dL — AB (ref 65–99)
POTASSIUM: 4.7 mmol/L (ref 3.5–5.1)
Sodium: 132 mmol/L — ABNORMAL LOW (ref 135–145)

## 2015-12-27 LAB — GLUCOSE, CAPILLARY
GLUCOSE-CAPILLARY: 132 mg/dL — AB (ref 65–99)
GLUCOSE-CAPILLARY: 150 mg/dL — AB (ref 65–99)
GLUCOSE-CAPILLARY: 242 mg/dL — AB (ref 65–99)
GLUCOSE-CAPILLARY: 243 mg/dL — AB (ref 65–99)
GLUCOSE-CAPILLARY: 268 mg/dL — AB (ref 65–99)
GLUCOSE-CAPILLARY: 287 mg/dL — AB (ref 65–99)
GLUCOSE-CAPILLARY: 291 mg/dL — AB (ref 65–99)
GLUCOSE-CAPILLARY: 68 mg/dL (ref 65–99)
Glucose-Capillary: 172 mg/dL — ABNORMAL HIGH (ref 65–99)

## 2015-12-27 LAB — CBC
HEMATOCRIT: 36.2 % — AB (ref 39.0–52.0)
Hemoglobin: 11.4 g/dL — ABNORMAL LOW (ref 13.0–17.0)
MCH: 24.4 pg — ABNORMAL LOW (ref 26.0–34.0)
MCHC: 31.5 g/dL (ref 30.0–36.0)
MCV: 77.5 fL — ABNORMAL LOW (ref 78.0–100.0)
Platelets: 279 10*3/uL (ref 150–400)
RBC: 4.67 MIL/uL (ref 4.22–5.81)
RDW: 16.3 % — AB (ref 11.5–15.5)
WBC: 12.1 10*3/uL — AB (ref 4.0–10.5)

## 2015-12-27 MED ORDER — INSULIN REGULAR HUMAN (CONC) 500 UNIT/ML ~~LOC~~ SOPN
40.0000 [IU] | PEN_INJECTOR | Freq: Three times a day (TID) | SUBCUTANEOUS | Status: DC
  Administered 2015-12-28 – 2015-12-30 (×7): 40 [IU] via SUBCUTANEOUS

## 2015-12-27 MED ORDER — SODIUM CHLORIDE 0.9 % IV BOLUS (SEPSIS)
500.0000 mL | Freq: Once | INTRAVENOUS | Status: AC
  Administered 2015-12-27: 500 mL via INTRAVENOUS

## 2015-12-27 MED ORDER — PIPERACILLIN-TAZOBACTAM 3.375 G IVPB
3.3750 g | Freq: Three times a day (TID) | INTRAVENOUS | Status: DC
  Administered 2015-12-27 – 2015-12-29 (×6): 3.375 g via INTRAVENOUS
  Filled 2015-12-27 (×8): qty 50

## 2015-12-27 MED ORDER — IOPAMIDOL (ISOVUE-300) INJECTION 61%
INTRAVENOUS | Status: AC
  Administered 2015-12-27: 100 mL
  Filled 2015-12-27: qty 100

## 2015-12-27 MED ORDER — INSULIN DETEMIR 100 UNIT/ML ~~LOC~~ SOLN
25.0000 [IU] | Freq: Two times a day (BID) | SUBCUTANEOUS | Status: DC
  Administered 2015-12-27 – 2015-12-29 (×4): 25 [IU] via SUBCUTANEOUS
  Filled 2015-12-27 (×5): qty 0.25

## 2015-12-27 MED ORDER — VANCOMYCIN HCL 10 G IV SOLR
1250.0000 mg | Freq: Two times a day (BID) | INTRAVENOUS | Status: DC
  Administered 2015-12-27 – 2015-12-29 (×5): 1250 mg via INTRAVENOUS
  Filled 2015-12-27 (×5): qty 1250

## 2015-12-27 MED ORDER — LIDOCAINE HCL (PF) 1 % IJ SOLN
10.0000 mL | Freq: Once | INTRAMUSCULAR | Status: AC
  Administered 2015-12-27: 5 mL
  Filled 2015-12-27: qty 10

## 2015-12-27 MED ORDER — FENTANYL CITRATE (PF) 100 MCG/2ML IJ SOLN
50.0000 ug | INTRAMUSCULAR | Status: DC | PRN
  Administered 2015-12-27 – 2015-12-30 (×15): 50 ug via INTRAVENOUS
  Filled 2015-12-27 (×15): qty 2

## 2015-12-27 NOTE — Progress Notes (Signed)
Patient discussed with Family Medicine physician who indicated that patient is not medically stable for d/c as he now has an infection and swollen scrotum and is requiring IV antibiotics. Unsure as to when he will be medically stable.  Per MD- patient will require a CPAP in the hospital and at [facility].  Spoke with patient- he has an old CPAP at home but has not used if for a very long time because the mask did not fit him.  He states that it was worn out. Notified Clydie Braun- Admissions at [facility] of above information. She will be able to order CPAP on Monday and will monitor for stability of patient. SW spoke with patient re: above and he wants to go to [facility] when stable.  Also- notified his sister Llewellyn Choplin of above per his permission.  Lorri Frederick. Aison Malveaux, LCSW [phone] (weekend coverage)

## 2015-12-27 NOTE — Consult Note (Signed)
5:27 PM   [name] August 25, 1950 [id number]  Referring provider: Dr. Clide Deutscher  Chief Complaint  Patient presents with  . Fall    HPI: The patient is a 65 year old gentleman with multiple medical comorbidities including morbid obesity and diabetes presented to the hospital with back pain. During the admission, he was noted to have scrotal swelling with concern for abscess. The patient does have a history of a scrotal abscess that spontaneously drained approximate 6 months ago. He is with survey: Zosyn. He notes that his posterior Scrotum is very tender to the touch. He also notes that is leaking a bloody fluid. This is foul-smelling. He has no fevers or chills. He is never had a scrotal abscess drained surgically. He just had the abscess that drained spontaneously. CT shows a 4 cm abscess versus phlegmon. This is in the posterior scrotum.   PMH: Past Medical History  Diagnosis Date  . Hypertension   . Chronic diastolic CHF (congestive heart failure) (HCC)   . CAD (coronary artery disease)     a. s/p PCI to RCA in 2012. b. prior cath in 01/2014 with elevated L/RH pressures, mild-mod CAD of LAD/LCx with patent RCA, normal EF, c/b CIN/CHF.  [name] PAF (paroxysmal atrial fibrillation) (HCC)     TEE DCCV 09/23/2014  . Anemia   . Pilonidal cyst 1980's; 01/25/2013  . Neuropathy (HCC)   . History of blood transfusion ~ 1954    "related to OR"  . Carpal tunnel syndrome, bilateral   . Morbid obesity (HCC)   . Hyperlipidemia   . OSA (obstructive sleep apnea)     "I wear nasal prongs; haven't been using prongs recently" (09/19/2014)  . Type II diabetes mellitus (HCC)   . Arthritis     "hands and lower back" (09/19/2014)  . Chronic lower back pain   . Chronic kidney disease (CKD), stage III (moderate)   . Cellulitis   . Hypoxia     a. Qualified for home O2 at [facility] in 09/2014.  [name] Physical deconditioning     Surgical History: Past Surgical History  Procedure Laterality Date  . Abdominal  surgery  ~ 1954    BENIGN TUMOR REMOVED  . Debridement  foot Left     debriding diabetic foot ulcers  . Cataract extraction w/phaco Right 11/15/2012    Procedure: CATARACT EXTRACTION PHACO AND INTRAOCULAR LENS PLACEMENT (IOC);  Surgeon: Shade Flood, MD;  Location: [facility] OR;  Service: Ophthalmology;  Laterality: Right;  . Cataract extraction w/phaco Left 11/29/2012    Procedure: CATARACT EXTRACTION PHACO AND INTRAOCULAR LENS PLACEMENT (IOC);  Surgeon: Shade Flood, MD;  Location: [facility] OR;  Service: Ophthalmology;  Laterality: Left;  . Pilonidal cyst excision N/A 01/08/2013    Procedure: CYST EXCISION PILONIDAL EXTENSIVE;  Surgeon: Axel Filler, MD;  Location: [facility] OR;  Service: General;  Laterality: N/A;  . Foreign body removal Right 2014    heel,  splinter removal   . Pars plana vitrectomy Left 06/05/2013    Procedure: PARS PLANA VITRECTOMY WITH 23 GAUGE with Endolaser(constellation);  Surgeon: Shade Flood, MD;  Location: [facility] OR;  Service: Ophthalmology;  Laterality: Left;  . Left and right heart catheterization with coronary angiogram N/A 01/31/2014    Procedure: LEFT AND RIGHT HEART CATHETERIZATION WITH CORONARY ANGIOGRAM;  Surgeon: Micheline Chapman, MD;  Location: [facility] CATH LAB;  Service: Cardiovascular;  Laterality: N/A;  . Tonsillectomy    . Appendectomy    . Eye surgery    . Coronary angioplasty with stent  placement  August 2012    RCA DES - [facility]  . Cardioversion  2010    [name] 09/19/2014  . Pilonidal cyst excision  1980's    "in [place]"  . Tee without cardioversion N/A 09/23/2014    Procedure: TRANSESOPHAGEAL ECHOCARDIOGRAM (TEE);  Surgeon: Lewayne Bunting, MD;  Location: [facility] ENDOSCOPY;  Service: Cardiovascular;  Laterality: N/A;  . Cardioversion N/A 09/23/2014    Procedure: CARDIOVERSION;  Surgeon: Lewayne Bunting, MD;  Location: [facility] ENDOSCOPY;  Service: Cardiovascular;  Laterality: N/A;  . Cardioversion N/A 12/11/2014    Procedure: CARDIOVERSION;  Surgeon:  Thurmon Fair, MD;  Location: [facility] ENDOSCOPY;  Service: Cardiovascular;  Laterality: N/A;    Home Medications:    Medication List    STOP taking these medications        insulin regular human CONCENTRATED 500 UNIT/ML injection  Commonly known as:  HUMULIN R     rosuvastatin 40 MG tablet  Commonly known as:  CRESTOR      TAKE these medications        amiodarone 200 MG tablet  Commonly known as:  PACERONE  Take 200 mg by mouth daily.     atorvastatin 80 MG tablet  Commonly known as:  LIPITOR  Take 1 tablet (80 mg total) by mouth daily.     furosemide 80 MG tablet  Commonly known as:  LASIX  Take 1 tablet (80 mg total) by mouth 2 (two) times daily.     gabapentin 300 MG capsule  Commonly known as:  NEURONTIN  Take 2 capsules (600 mg total) by mouth 2 (two) times daily.     hydrocerin Crea  Apply 1 application topically daily.     insulin aspart 100 UNIT/ML injection  Commonly known as:  NOVOLOG  Inject 20 Units into the skin 3 (three) times daily with meals.     insulin aspart 100 UNIT/ML injection  Commonly known as:  novoLOG  Inject 0-20 Units into the skin 3 (three) times daily with meals.     insulin detemir 100 UNIT/ML injection  Commonly known as:  LEVEMIR  Inject 0.3 mLs (30 Units total) into the skin at bedtime.     insulin detemir 100 UNIT/ML injection  Commonly known as:  LEVEMIR  Inject 0.1 mLs (10 Units total) into the skin every morning.     lisinopril 10 MG tablet  Commonly known as:  PRINIVIL,ZESTRIL  Take 1 tablet (10 mg total) by mouth daily.     metolazone 5 MG tablet  Commonly known as:  ZAROXOLYN  Take 1 tablet (5 mg total) by mouth daily. TAKE 30 MINUTES BEFORE TAKING LASIX     metoprolol 50 MG tablet  Commonly known as:  LOPRESSOR  Take 50 mg by mouth 2 (two) times daily. TWO TABLETS TWICE A DAY     multivitamin with minerals Tabs tablet  Take 1 tablet by mouth daily.     mupirocin cream 2 %  Commonly known as:  BACTROBAN  Apply  topically 2 (two) times daily.     NATURAL BALANCE TEARS OP  Place 1 drop into both eyes daily as needed (for dry eyes).     Oxycodone HCl 20 MG Tabs  Take 1 tablet (20 mg total) by mouth every 6 (six) hours as needed for severe pain.     polyethylene glycol packet  Commonly known as:  MIRALAX / GLYCOLAX  Take 17 g by mouth daily as needed for mild constipation.  potassium chloride SA 20 MEQ tablet  Commonly known as:  KLOR-CON M20  Take 1 tablet (20 mEq total) by mouth daily.     XARELTO 20 MG Tabs tablet  Generic drug:  rivaroxaban  TAKE 1 TABLET (20 MG TOTAL) BY MOUTH DAILY WITH SUPPER.        Allergies: No Known Allergies  Family History: Family History  Problem Relation Age of Onset  . Adopted: Yes  . Other Other     PT ADOPTED    Social History:  reports that he quit smoking about 32 years ago. His smoking use included Cigarettes. He has a 20 pack-year smoking history. He has never used smokeless tobacco. He reports that he does not drink alcohol or use illicit drugs.  ROS: 12 point ROS negative except per HPI                                        Physical Exam: BP 121/61 mmHg  Pulse 66  Temp(Src) 98.3 F (36.8 C) (Oral)  Resp 20  Ht 6' (1.829 m)  Wt 369 lb 0.8 oz (167.4 kg)  BMI 50.04 kg/m2  SpO2 97%  Constitutional:  Alert and oriented, No acute distress. HEENT: [place] AT, moist mucus membranes.  Trachea midline, no masses. Cardiovascular: No clubbing, cyanosis, or edema. Respiratory: Normal respiratory effort, no increased work of breathing. GI: Abdomen is soft, nontender, nondistended, no abdominal masses GU: No CVA tenderness. The posterior scrotum and a 4 cm fluctuant area consistent with an abscess. There is follows Drainage from the most inferior aspect of this abscess. Skin: No rashes, bruises or suspicious lesions. Lymph: No cervical or inguinal adenopathy. Neurologic: Grossly intact, no focal deficits, moving all 4  extremities. Psychiatric: Normal mood and affect.  Laboratory Data: Lab Results  Component Value Date   WBC 12.1* 12/27/2015   HGB 11.4* 12/27/2015   HCT 36.2* 12/27/2015   MCV 77.5* 12/27/2015   PLT 279 12/27/2015    Lab Results  Component Value Date   CREATININE 1.51* 12/27/2015    Lab Results  Component Value Date   PSA 0.68 10/01/2008    No results found for: TESTOSTERONE  Lab Results  Component Value Date   HGBA1C 8.7 10/14/2015    Urinalysis    Component Value Date/Time   COLORURINE YELLOW 12/23/2015 1435   APPEARANCEUR CLEAR 12/23/2015 1435   LABSPEC 1.018 12/23/2015 1435   PHURINE 5.5 12/23/2015 1435   GLUCOSEU 500* 12/23/2015 1435   HGBUR MODERATE* 12/23/2015 1435   BILIRUBINUR NEGATIVE 12/23/2015 1435   KETONESUR 15* 12/23/2015 1435   PROTEINUR NEGATIVE 12/23/2015 1435   UROBILINOGEN 1.0 11/27/2014 0755   NITRITE NEGATIVE 12/23/2015 1435   LEUKOCYTESUR NEGATIVE 12/23/2015 1435    Pertinent Imaging: CLINICAL DATA: Scrotal swelling and induration.  EXAM: CT PELVIS WITH CONTRAST  TECHNIQUE: Multidetector CT imaging of the pelvis was performed using the standard protocol following the bolus administration of intravenous contrast.  CONTRAST: 1 ISOVUE-300 IOPAMIDOL (ISOVUE-300) INJECTION 61%  COMPARISON: CTs, 09/15/2015 and 09/08/2015. Current scrotal ultrasound.  FINDINGS: There is an irregular enhancing mass like area in the posterior aspect of the scrotum superior to the testicles. This measures 5.4 x 3.9 x 5.3 cm. There is adjacent fluid and skin thickening. This is consistent with a poorly defined abscess or phlegmon.  No adenopathy. Within the visualized abdomen and pelvis, there are no masses or abnormal fluid collections.  Visualize colon is unremarkable. Infarct  Musculoskeletal: No areas of bone resorption are seen to suggest osteomyelitis. No osteoblastic or osteolytic lesions.  IMPRESSION: 5.4 cm phlegmon or poorly  defined abscess arising along superficial aspect of the posterior scrotum. No other acute abnormality.  Procedure: After informed consent and discussion the patient the risks and benefits of abscess drainage, the patient was prepped and draped in the usual sterile fashion. 8 cc of 1% lidocaine without epinephrine was used to numb the skin of his posterior scrotum. A 3 cm incision was then made with immediate expulsion of follow brown smelling fluid consistent with abscess material. This fluid was sent for culture. The abscess was probed with a hemostat to break up loculations. It was then packed with half-inch gauze. Patient tolerated the procedure well.  Assessment & Plan:    1. Scrotal abscess s/p bedside I and D -continue packing. Change packing daily starting Sunday afternoon/evening. Will need daily packing changes for at least one to two weeks. -continue broad spectrum antibiotics pending wound culture results -no further urologic intervention during this admission. Will sign off. -follow up with urology in 1 - 2 weeks after discharge  Hildred Laser, MD

## 2015-12-27 NOTE — Progress Notes (Signed)
Patient CBG this pm was 68, pt. Asymptomatic.MD notified see new order for medication adjustment. Recheck cbg is 132. Will continue to monitor patient.

## 2015-12-27 NOTE — Progress Notes (Signed)
Pharmacy Antibiotic Note  [name] is a 65 y.o. male admitted on 12/23/2015 s/p falling. Pharmacy has been consulted for vanc/zosyn dosing for cellulitis.  WBC slightly elevated, no fevers, concern for cellulitis d/t scrotal swelling  Plan: Vanc 1250 mg IV q12h Zosyn 3.375 g IV q8h Monitor renal function, cultures, LOT, VT prn  Height: 6' (182.9 cm) Weight: (!) 369 lb 0.8 oz (167.4 kg) IBW/kg (Calculated) : 77.6  Temp (24hrs), Avg:98.1 F (36.7 C), Min:97.7 F (36.5 C), Max:98.8 F (37.1 C)   Recent Labs Lab 12/23/15 1310 12/24/15 0331 12/25/15 0556 12/26/15 1043 12/26/15 1920 12/27/15 0556  WBC 17.3* 13.9* 11.1* 13.1*  --  12.1*  CREATININE 3.12* 2.56* 1.90* 1.66* 1.80* 1.51*    Estimated Creatinine Clearance: 79.3 mL/min (by C-G formula based on Cr of 1.51).    No Known Allergies  Antimicrobials this admission: vanc 6/17 >>  zosyn 6/17 >>   Dose adjustments this admission: n/a  Microbiology results: 6/17 BCx:   Thank you for allowing pharmacy to be a part of this patient's care.  Greggory Stallion, PharmD Clinical Pharmacy Resident Pager # [phone] 12/27/2015 11:11 AM

## 2015-12-27 NOTE — Progress Notes (Signed)
Family Medicine Teaching Service Daily Progress Note Intern Pager: [phone]  Patient name: [name] Medical record number: [id number] Date of birth: 1950-12-25 Age: 65 y.o. Gender: male  Primary Care Provider: [name], MD Consultants: Orthopedic Surgery Code Status: DNR  Pt Overview and Major Events to Date:  6/13: Admitted after a fall  Assessment and Plan: [name] is a 65 y.o. male presenting with back pain after fall. PMH is significant for chronic diastolic CHF, CAD (s/p PCI to RCA in 2012, prior cath in 01/2014 with elevated L/RH pressures, mild-mod CAD of LAD/LCx with patent RCA, normal EF, c/b CIN/CHF), morbid obesity, paroxysmal atrial fibrillation, OSA (unable to tolerate CPAP), DM, HLD, anemia, CKD stage III, deconditioning.   #Scrotal Swelling. Concern for scrotal abscess. Tachycardic with mild leukcocytosis. No fevers. Has a history of scrotal abscess.  - STAT scrotal US pending - Will likely need surgery consult pending above - Check blood cultures - Start IV abx with vanc and zosyn.   #Back pain after fall: History of falls, with an admission in December 2016 in setting of afib with RVR. Does have history of chronic back pain and LE neuropathy with numbness. Neuropathy contributing to frequent falls. Imaging unremarkable except for right proximal metatarsal fracture, that appears to be chronic in nature. -Continue home oxycodone 20 mg q6h prn  -fentanyl 50 mg q8h prn -PT/OT consult --> SNF, 3-in-1 bedside commode -ortho consult placed, appreciate recommendations --> Reviewed R foot xray and noted 5th MT fracture wtihout lisfranc fracture or charcot collapse. Recommended WBAT right foot with post op shoe or supportive walking shoe (no sandals or bare foot ambulation). -fall precautions  #Rhabodomyolysis. Resolved.  -trend CK --> 2163 --> 747 (12/25/15)  #AoCKD: SCr of 3.12 on admission. Baseline SCr ~1.5. Likely ATN in setting of rhabdomyolysis.  Resolved. -daily BMPs -avoid nephrotoxic agents; holding home lasix and metolazone -I/Os --> -2.5 L  #T2DM. A1c 10/2015 8.7. Takes Levemir 20U daily and Humulin R 20U TID with meals at home. Persistently hyperglycemic here.  - Levemir 30U bid - U500 50U three times daily with meals - SSI, resistant scale  - qAC/HS CBGs  #CHF. Echo 11/2014 EF 50-55% but with LVH. No signs of fluid overload. -hold lasix 80 mg BID for AKI -hold metolazone 5 mg daily for AKI -continue metoprolol 100 mg BID -Monitor I/Os, daily weights, restart diuretics as needed.   #Paroxysmal A.fib: In afib on exam. HR <100.  -continue Xarelto  -continue amiodarone 200 mg daily and metoprolol 100 mg BID  #HTN:  -continue home metoprolol -hold lisinopril in setting of AKI  #CAD/HLD -switched from crestor to atorvastatin due to renal function -on xarelto  #Leukocytosis: 17.3 on admission. Afebrile on presentation. Possibly related to possible scrotal abscess.  -monitor with daily CBCs  #OSA.  -Uses 2L of O2 at home intermittently -CPAP ordered for hospitalization   #Microcytic anemia / GI bleed: Baseline ~ 10. Denies dark stools. Iron wnl.  -FOBT positive in ED --> repeat 6/15 also positive -PCP had recommended scoping for last several years; would require inpatient procedure due to risk factors of afib and morbid obesity -GI consulted and recommends EGD and colonoscopy arranged outpatient but with actual procedure in hospital for anesthesia  FEN/GI: Heart Healthy/Carb Modified Diet, SLIV Prophylaxis: on xarelto  Disposition: Likely discharge home later today.   Subjective:  Back and hip pain improved this morning, though reports that he has had swelling and pain in his scrotum for the past few  weeks. He has not told anyone about this since admission. Last night says that he started having bleeding and pus draining from the area. No fevers. Had a similar presentation 3 months ago and had to be admitted due  to sepsis secondary to a scrotal abscess.   Objective: Temp:  [97.7 F (36.5 C)-98.8 F (37.1 C)] 98.8 F (37.1 C) (06/17 0425) Pulse Rate:  [68-73] 73 (06/17 0425) Resp:  [17-19] 17 (06/17 0425) BP: (110-126)/(41-72) 126/55 mmHg (06/17 0425) SpO2:  [94 %-98 %] 98 % (06/17 0425) Weight:  [369 lb 0.8 oz (167.4 kg)] 369 lb 0.8 oz (167.4 kg) (06/17 0425) Physical Exam: General: Morbidly obese male, lying in bed  Neck: Supple, could not assess for JVD due to habitus Cardiovascular: Irregularly irregular, S1, S2, no m/r/g Respiratory: CTAB, moving air well throughout  Abdomen: +BS, soft, ND, no TTP GU: Scrotum significantly edematous and erythematous. Exam limited by body habitus. Fluctuance noted. No obvious areas of drainage. MSK: LE strength 5-/5 on the left and 5-/5 on the right. He can wiggle his toes. Abrasions across both feet. No LE edema.  Skin: Very dry skin across feet, bruised toenails.  Neuro: AOx3. Decreased sensation to touch to ankle of R LE and to mid-shin to L LE.  Psych: Appropriate mood and affect.   Laboratory:  Recent Labs Lab 12/25/15 0556 12/26/15 1043 12/27/15 0556  WBC 11.1* 13.1* 12.1*  HGB 10.7* 11.2* 11.4*  HCT 34.5* 35.3* 36.2*  PLT 252 262 279    Recent Labs Lab 12/26/15 2150 12/27/15 0001 12/27/15 0358 12/27/15 0608 12/27/15 0802  GLUCAP 292* 242* 243* 291* 287*      Recent Labs Lab 12/23/15 1310 12/24/15 0331  12/26/15 1043 12/26/15 1920 12/27/15 0556  NA 133* 135  < > 133* 133* 132*  K 6.0* 5.2*  < > 4.5 4.4 4.7  CL 95* 99*  < > 98* 97* 95*  CO2 23 26  < > 24 27 27   BUN 87* 71*  < > 33* 33* 29*  CREATININE 3.12* 2.56*  < > 1.66* 1.80* 1.51*  CALCIUM 8.9 8.8*  < > 9.6 9.2 9.5  PROT 6.6 6.7  --   --   --   --   BILITOT 1.7* 1.2  --   --   --   --   ALKPHOS 53 49  --   --   --   --   ALT 20 18  --   --   --   --   AST 71* 44*  --   --   --   --   GLUCOSE 445* 423*  < > 371* 396* 315*  < > = values in this interval not  displayed.   Imaging/Diagnostic Tests: None new.   , MD 12/27/2015, 8:11 AM PGY-2, [facility] FPTS Intern pager: [phone], text pages welcome

## 2015-12-28 LAB — GLUCOSE, CAPILLARY
GLUCOSE-CAPILLARY: 268 mg/dL — AB (ref 65–99)
GLUCOSE-CAPILLARY: 309 mg/dL — AB (ref 65–99)
GLUCOSE-CAPILLARY: 127 mg/dL — AB (ref 65–99)
Glucose-Capillary: 276 mg/dL — ABNORMAL HIGH (ref 65–99)
Glucose-Capillary: 367 mg/dL — ABNORMAL HIGH (ref 65–99)

## 2015-12-28 LAB — CBC
HEMATOCRIT: 33.4 % — AB (ref 39.0–52.0)
HEMOGLOBIN: 10.3 g/dL — AB (ref 13.0–17.0)
MCH: 24 pg — ABNORMAL LOW (ref 26.0–34.0)
MCHC: 30.8 g/dL (ref 30.0–36.0)
MCV: 77.7 fL — ABNORMAL LOW (ref 78.0–100.0)
Platelets: 294 10*3/uL (ref 150–400)
RBC: 4.3 MIL/uL (ref 4.22–5.81)
RDW: 16.4 % — AB (ref 11.5–15.5)
WBC: 14.1 10*3/uL — AB (ref 4.0–10.5)

## 2015-12-28 LAB — BASIC METABOLIC PANEL
ANION GAP: 11 (ref 5–15)
BUN: 28 mg/dL — ABNORMAL HIGH (ref 6–20)
CALCIUM: 8.9 mg/dL (ref 8.9–10.3)
CO2: 26 mmol/L (ref 22–32)
Chloride: 96 mmol/L — ABNORMAL LOW (ref 101–111)
Creatinine, Ser: 1.59 mg/dL — ABNORMAL HIGH (ref 0.61–1.24)
GFR, EST AFRICAN AMERICAN: 51 mL/min — AB (ref >60)
GFR, EST NON AFRICAN AMERICAN: 44 mL/min — AB (ref >60)
Glucose, Bld: 251 mg/dL — ABNORMAL HIGH (ref 65–99)
Potassium: 4.1 mmol/L (ref 3.5–5.1)
Sodium: 133 mmol/L — ABNORMAL LOW (ref 135–145)

## 2015-12-28 NOTE — Progress Notes (Signed)
Family Medicine Teaching Service Daily Progress Note Intern Pager: [phone]  Patient name: [name] Medical record number: [id number] Date of birth: 1951-02-08 Age: 65 y.o. Gender: male  Primary Care Provider: [name], MD Consultants: Orthopedic Surgery Code Status: DNR  Pt Overview and Major Events to Date:  6/13: Admitted after a fall 6/17: Scrotal I&D performed  Assessment and Plan: [name] is a 65 y.o. male presenting with back pain after fall. PMH is significant for chronic diastolic CHF, CAD (s/p PCI to RCA in 2012, prior cath in 01/2014 with elevated L/RH pressures, mild-mod CAD of LAD/LCx with patent RCA, normal EF, c/b CIN/CHF), morbid obesity, paroxysmal atrial fibrillation, OSA (unable to tolerate CPAP), DM, HLD, anemia, CKD stage III, deconditioning.   #Scrotal Swelling. Concern for scrotal abscess. Tachycardic with mild leukcocytosis. No fevers. Has a history of scrotal abscess.  - Scrotal US 6/17 showed right testicular cyst and small left hydrocele - CT showed 4 cm abscess versus phlegmon of posterior scrotum - Urology was consulted and performed scrotal I&D --> Change packing daily for 1-2 weeks. Continue broad spectrum antibiotics until cx results. Follow-up with urology in 1-2 weeks.  - Abscess culture --> pending - Abscess gram stain --> Few WBC (PMN), few gram positive cocci in pairs, rare gram negative rods - Follow blood cultures --> NGTD - Continue IV abx with vanc and zosyn.  - Elevate scrotum to help with swelling  #Back pain after fall: History of falls, with an admission in December 2016 in setting of afib with RVR. Does have history of chronic back pain and LE neuropathy with numbness. Neuropathy contributing to frequent falls. Imaging unremarkable except for right proximal metatarsal fracture, that appears to be chronic in nature. -Continue home oxycodone 20 mg q6h prn  -fentanyl 50 mg q8h prn -PT/OT consult --> SNF, 3-in-1 bedside  commode -ortho consult placed, appreciate recommendations --> Reviewed R foot xray and noted 5th MT fracture wtihout lisfranc fracture or charcot collapse. Recommended WBAT right foot with post op shoe or supportive walking shoe (no sandals or bare foot ambulation). -fall precautions  #Rhabodomyolysis. Resolved.  -trend CK --> 2163 --> 747 (12/25/15)  #AoCKD: SCr of 3.12 on admission. Baseline SCr ~1.5. Likely ATN in setting of rhabdomyolysis. Resolved. -daily BMPs -avoid nephrotoxic agents; holding home lasix and metolazone -I/Os --> -0.7 L  #T2DM. A1c 10/2015 8.7. Takes Levemir 20U daily and Humulin R 20U TID with meals at home. Persistently hyperglycemia here except for 6/17 with CBG of 68 at 1620. - Decreased levemir to 25U bid - Decreased U500 to 40U three times daily with meals - SSI, resistant scale  - qAC/HS CBGs  #CHF. Echo 11/2014 EF 50-55% but with LVH. No signs of fluid overload. -hold lasix 80 mg BID for initial AKI -hold metolazone 5 mg daily for initial AKI -continue metoprolol 100 mg BID -Monitor I/Os, daily weights, restart diuretics as needed.   #Paroxysmal A.fib: In afib on exam. HR <100.  -continue Xarelto  -continue amiodarone 200 mg daily and metoprolol 100 mg BID  #HTN: Ranged from 107/44 to 136/37 -continue home metoprolol -hold lisinopril with initial AKI --> restart if remains hypertensive  #CAD/HLD -switched from crestor to atorvastatin due to renal function -on xarelto  #Leukocytosis: 17.3 on admission. Afebrile on presentation. Possibly related to possible scrotal abscess.  -WBC 14.1 today, 6/18 -monitor with daily CBCs  #OSA.  -Uses 2L of O2 at home intermittently -CPAP ordered for hospitalization   #Microcytic anemia / GI  bleed: Stable. Baseline ~ 10. Denies dark stools. Iron wnl.  -FOBT positive in ED --> repeat 6/15 also positive -PCP had recommended scoping for last several years; would require inpatient procedure due to risk factors of  afib and morbid obesity -GI consulted and recommends EGD and colonoscopy arranged outpatient but with actual procedure in hospital for anesthesia -Follow hemoglobin on CBCs  FEN/GI: Heart Healthy/Carb Modified Diet, SLIV Prophylaxis: on xarelto  Disposition: Continue IV antibiotics until abscess cultures result then switch to PO. Anticipate discharge to SNF in next 1-2 days.   Subjective:  Back and hip pain improved this morning, though reports that he has had swelling and pain in his scrotum for the past few weeks. He has not told anyone about this since admission. Last night says that he started having bleeding and pus draining from the area. No fevers. Had a similar presentation 3 months ago and had to be admitted due to sepsis secondary to a scrotal abscess.   Objective: Temp:  [98 F (36.7 C)-98.8 F (37.1 C)] 98 F (36.7 C) (06/18 0412) Pulse Rate:  [52-115] 59 (06/18 0416) Resp:  [18-20] 18 (06/18 0412) BP: (96-136)/(35-94) 136/37 mmHg (06/18 0416) SpO2:  [93 %-97 %] 97 % (06/18 0412) Weight:  [369 lb 11.2 oz (167.695 kg)] 369 lb 11.2 oz (167.695 kg) (06/18 0433) Physical Exam: General: Morbidly obese male, lying in bed  Cardiovascular: Irregularly irregular, S1, S2, no m/r/g Respiratory: CTAB, moving air well throughout  Abdomen: +BS, soft, ND, mild diffuse TTP  GU: Scrotum edematous but no longer erythematous. Less than the size of an oragne today. Exam limited by body habitus. I&D incision site clean. No active drainage noted.  MSK: LE strength 5-/5 on the left and 5-/5 on the right. He can wiggle his toes. Abrasions across both feet. No LE edema.  Skin: Very dry skin across feet, bruised toenails.  Neuro: AOx3. Decreased sensation to touch to ankle of R LE and to mid-shin to L LE.  Psych: Appropriate mood and affect.   Laboratory:  Recent Labs Lab 12/26/15 1043 12/27/15 0556 12/28/15 0258  WBC 13.1* 12.1* 14.1*  HGB 11.2* 11.4* 10.3*  HCT 35.3* 36.2* 33.4*   PLT 262 279 294    Recent Labs Lab 12/27/15 1938 12/27/15 2358 12/28/15 0407 12/28/15 0704 12/28/15 0750  GLUCAP 150* 172* 268* 309* 276*      Recent Labs Lab 12/23/15 1310 12/24/15 0331  12/26/15 1920 12/27/15 0556 12/28/15 0258  NA 133* 135  < > 133* 132* 133*  K 6.0* 5.2*  < > 4.4 4.7 4.1  CL 95* 99*  < > 97* 95* 96*  CO2 23 26  < > 27 27 26   BUN 87* 71*  < > 33* 29* 28*  CREATININE 3.12* 2.56*  < > 1.80* 1.51* 1.59*  CALCIUM 8.9 8.8*  < > 9.2 9.5 8.9  PROT 6.6 6.7  --   --   --   --   BILITOT 1.7* 1.2  --   --   --   --   ALKPHOS 53 49  --   --   --   --   ALT 20 18  --   --   --   --   AST 71* 44*  --   --   --   --   GLUCOSE 445* 423*  < > 396* 315* 251*  < > = values in this interval not displayed.   Imaging/Diagnostic Tests: None new.  Casey Burkitt, MD 12/28/2015, 8:47 AM PGY-1, [facility] FPTS Intern pager: [phone], text pages welcome

## 2015-12-29 LAB — BASIC METABOLIC PANEL
ANION GAP: 12 (ref 5–15)
BUN: 27 mg/dL — ABNORMAL HIGH (ref 6–20)
CALCIUM: 9.2 mg/dL (ref 8.9–10.3)
CO2: 25 mmol/L (ref 22–32)
Chloride: 96 mmol/L — ABNORMAL LOW (ref 101–111)
Creatinine, Ser: 1.68 mg/dL — ABNORMAL HIGH (ref 0.61–1.24)
GFR, EST AFRICAN AMERICAN: 48 mL/min — AB (ref >60)
GFR, EST NON AFRICAN AMERICAN: 41 mL/min — AB (ref >60)
GLUCOSE: 268 mg/dL — AB (ref 65–99)
POTASSIUM: 4.1 mmol/L (ref 3.5–5.1)
Sodium: 133 mmol/L — ABNORMAL LOW (ref 135–145)

## 2015-12-29 LAB — CBC
HEMATOCRIT: 34.8 % — AB (ref 39.0–52.0)
Hemoglobin: 11.1 g/dL — ABNORMAL LOW (ref 13.0–17.0)
MCH: 24.6 pg — ABNORMAL LOW (ref 26.0–34.0)
MCHC: 31.9 g/dL (ref 30.0–36.0)
MCV: 77.2 fL — AB (ref 78.0–100.0)
PLATELETS: 285 10*3/uL (ref 150–400)
RBC: 4.51 MIL/uL (ref 4.22–5.81)
RDW: 16.3 % — AB (ref 11.5–15.5)
WBC: 14.3 10*3/uL — AB (ref 4.0–10.5)

## 2015-12-29 LAB — GLUCOSE, CAPILLARY
GLUCOSE-CAPILLARY: 298 mg/dL — AB (ref 65–99)
GLUCOSE-CAPILLARY: 307 mg/dL — AB (ref 65–99)
GLUCOSE-CAPILLARY: 258 mg/dL — AB (ref 65–99)
GLUCOSE-CAPILLARY: 191 mg/dL — AB (ref 65–99)
Glucose-Capillary: 139 mg/dL — ABNORMAL HIGH (ref 65–99)
Glucose-Capillary: 145 mg/dL — ABNORMAL HIGH (ref 65–99)
Glucose-Capillary: 207 mg/dL — ABNORMAL HIGH (ref 65–99)
Glucose-Capillary: 372 mg/dL — ABNORMAL HIGH (ref 65–99)

## 2015-12-29 MED ORDER — DOXYCYCLINE HYCLATE 100 MG PO TABS
100.0000 mg | ORAL_TABLET | Freq: Two times a day (BID) | ORAL | Status: DC
  Administered 2015-12-29 – 2015-12-30 (×3): 100 mg via ORAL
  Filled 2015-12-29 (×3): qty 1

## 2015-12-29 MED ORDER — INSULIN DETEMIR 100 UNIT/ML ~~LOC~~ SOLN
30.0000 [IU] | Freq: Two times a day (BID) | SUBCUTANEOUS | Status: DC
  Administered 2015-12-29 – 2015-12-30 (×2): 30 [IU] via SUBCUTANEOUS
  Filled 2015-12-29 (×3): qty 0.3

## 2015-12-29 NOTE — Discharge Instructions (Signed)

## 2015-12-29 NOTE — Care Management Important Message (Signed)
Important Message  Patient Details  Name: [name] MRN: [id number] Date of Birth: 26-Nov-1950   Medicare Important Message Given:  Yes    Bernadette Hoit 12/29/2015, 10:11 AM

## 2015-12-29 NOTE — Progress Notes (Addendum)
Physical Therapy Treatment Patient Details Name: [name] MRN: [id number] DOB: 1950/07/23 Today's Date: 12/29/2015    History of Present Illness 65 Y/O M with Pmx of DM2, HLD, morbid obesity, HTN, OSA, CAD, HFpEF,CKD III, Afib presented with hx of fall more than 12 hours ago with leg pain upon trying to get up. Found to have AKI and rhabdomyolsis. Pt found to have fx of rt 5th metatarsal.    PT Comments    Pt impulsive during session, refusing to wait for PT for appropriate safety considerations to be addressed. Pt also refusing to use shoe or socks. Attempted to educate pt regarding recommendations and safety considerations. At this time, continue to recommend SNF for further care and rehabilitation following acute stay.   Follow Up Recommendations  SNF     Equipment Recommendations  None recommended by PT (to be addressed at next venue)    Recommendations for Other Services       Precautions / Restrictions Precautions Precautions: Fall Required Braces or Orthoses: Other Brace/Splint Other Brace/Splint: post-op shoe or supportive walking shoe on rt Restrictions Weight Bearing Restrictions: No RLE Weight Bearing: Weight bearing as tolerated LLE Weight Bearing: Weight bearing as tolerated    Mobility  Bed Mobility Overal bed mobility: Needs Assistance Bed Mobility: Sit to Supine       Sit to supine: Min guard   General bed mobility comments: guard for safety  Transfers Overall transfer level: Needs assistance Equipment used: None Transfers: Sit to/from Stand Sit to Stand: Supervision         General transfer comment: Pt refusing to wait for rw or PT prior to standing. No loss of balance but impulsive with mobility.   Ambulation/Gait Ambulation/Gait assistance: Supervision Ambulation Distance (Feet): 25 Feet Assistive device:  (IV pole) Gait Pattern/deviations: Step-through pattern Gait velocity: decreased   General Gait Details: Pt standing and  starting to ambulate before therapist ready. Pt refusing to use any assistive device and also refusing to wear post-op shoe or slippers. Pt not accepting of education regarding use of shoe or socks.    Stairs            Wheelchair Mobility    Modified Rankin (Stroke Patients Only)       Balance Overall balance assessment: Needs assistance Sitting-balance support: No upper extremity supported Sitting balance-Leahy Scale: Good     Standing balance support: No upper extremity supported Standing balance-Leahy Scale: Fair Standing balance comment: using rw for support during ambulation, able to stand static without use of device at EOB.                     Cognition Arousal/Alertness: Awake/alert Behavior During Therapy: Agitated Overall Cognitive Status: Within Functional Limits for tasks assessed                      Exercises      General Comments General comments (skin integrity, edema, etc.): Pt impulsive with activity, refusing to use shoes or slippers, rw or gait belt. Pt refusing any further ambulation or return to sitting in chair.       Pertinent Vitals/Pain Pain Assessment: Faces Faces Pain Scale: Hurts even more Pain Location: scrotum Pain Descriptors / Indicators: Grimacing Pain Intervention(s): Monitored during session    Home Living                      Prior Function  PT Goals (current goals can now be found in the care plan section) Acute Rehab PT Goals Patient Stated Goal: get back to bed PT Goal Formulation: With patient Time For Goal Achievement: 01/07/16 Potential to Achieve Goals: Good Progress towards PT goals: Progressing toward goals    Frequency  Min 2X/week    PT Plan Current plan remains appropriate;Frequency needs to be updated    Co-evaluation             End of Session Equipment Utilized During Treatment:  (pt not waiting for belt to be used) Activity Tolerance: Patient tolerated  treatment well Patient left: in bed;with call bell/phone within reach;with bed alarm set     Time: 4081-4481 PT Time Calculation (min) (ACUTE ONLY): 10 min  Charges:  $Gait Training: 8-22 mins                    G Codes:      Christiane Ha, PT, CSCS Pager [phone] Office [phone]  12/29/2015, 10:27 AM

## 2015-12-29 NOTE — Progress Notes (Signed)
Family Medicine Teaching Service Daily Progress Note Intern Pager: [phone]  Patient name: [name] Medical record number: [id number] Date of birth: 1950-09-01 Age: 65 y.o. Gender: male  Primary Care Provider: [name], MD Consultants: Orthopedic Surgery, Urology Code Status: DNR  Pt Overview and Major Events to Date:  6/13: Admitted after a fall 6/17: Scrotal I&D performed  Assessment and Plan: [name] is a 65 y.o. male presenting with back pain after fall. PMH is significant for chronic diastolic CHF, CAD (s/p PCI to RCA in 2012, prior cath in 01/2014 with elevated L/RH pressures, mild-mod CAD of LAD/LCx with patent RCA, normal EF, c/b CIN/CHF), morbid obesity, paroxysmal atrial fibrillation, OSA (unable to tolerate CPAP), DM, HLD, anemia, CKD stage III, deconditioning.   #Scrotal Swelling. With abscess, now drained. Tachycardic with mild leukcocytosis. No fevers. Has a history of scrotal abscess.  - Scrotal US 6/17 showed right testicular cyst and small left hydrocele - CT showed 4 cm abscess versus phlegmon of posterior scrotum - Urology was consulted and performed scrotal I&D --> Change packing daily for 1-2 weeks. Continue broad spectrum antibiotics until cx results. Follow-up with urology in 1-2 weeks.  - Abscess culture --> reincubated for better growth - Abscess gram stain --> Few WBC (PMN), few gram positive cocci in pairs, rare gram negative rods - Follow blood cultures --> NGTD (1 day) - Because patient vital signs remain stable, discontinue IV abx vanc and zosyn (received 2 days) and transition to PO doxycycline 100 mg BID x 5 days - Elevate scrotum to help with swelling  #Back pain after fall: History of falls, with an admission in December 2016 in setting of afib with RVR. Does have history of chronic back pain and LE neuropathy with numbness. Neuropathy contributing to frequent falls. Imaging unremarkable except for right proximal metatarsal fracture,  that appears to be chronic in nature. -Continue home oxycodone 20 mg q6h prn  -fentanyl 50 mg q4h prn -PT/OT consult --> SNF, 3-in-1 bedside commode -ortho consult placed, appreciate recommendations --> Reviewed R foot xray and noted 5th MT fracture wtihout lisfranc fracture or charcot collapse. Recommended WBAT right foot with post op shoe or supportive walking shoe (no sandals or bare foot ambulation). -fall precautions  #Rhabodomyolysis. Resolved.  -trend CK --> 2163 --> 747 (12/25/15)  #AoCKD: SCr of 3.12 on admission >> 1.68. Baseline SCr ~1.5. Likely ATN in setting of rhabdomyolysis. Resolved. -daily BMPs -avoid nephrotoxic agents; holding home lasix and metolazone -I/Os --> -1 L  #T2DM. A1c 10/2015 8.7. Takes Levemir 20U daily and Humulin R 20U TID with meals at home. Persistently hyperglycemia here except for 6/17 with CBG of 68. - Recent CBGs 309 > 276 > 367 > 127 > 207 > 298 > 307  - Decreased levemir to 25U bid --> increase to 30 U daily as required 34 units novolog 6/18 - Continue U500 to 40U three times daily with meals - SSI, resistant scale  - qAC/HS CBGs  #CHF. Echo 11/2014 EF 50-55% but with LVH. No signs of fluid overload.   -hold lasix 80 mg BID for initial AKI -hold metolazone 5 mg daily for initial AKI -continue metoprolol 100 mg BID -Monitor I/Os, daily weights, restart diuretics as needed --> weight down 10 lbs since admission  #Paroxysmal A.fib: In afib on exam. HR <100.  -continue Xarelto  -continue amiodarone 200 mg daily and metoprolol 100 mg BID  #HTN: Ranged from 107/44 to 136/37 -continue home metoprolol -hold lisinopril with initial AKI -->  restart upon discharge  #CAD/HLD -switched from crestor to atorvastatin due to renal function -on xarelto  #Leukocytosis: 17.3 on admission. Afebrile on presentation. Possibly related to possible scrotal abscess.  -WBC 14.3 today, 6/19 -monitor with daily CBCs  #OSA.  -Uses 2L of O2 at home  intermittently -CPAP ordered for hospitalization   #Microcytic anemia / GI bleed: Stable. Baseline ~ 10. Denies dark stools. Iron wnl.  -FOBT positive in ED --> repeat 6/15 also positive -PCP had recommended scoping for last several years; would require inpatient procedure due to risk factors of afib and morbid obesity -GI consulted and recommends EGD and colonoscopy arranged outpatient but with actual procedure in hospital for anesthesia -Follow hemoglobin on CBCs  FEN/GI: Heart Healthy/Carb Modified Diet, SLIV Prophylaxis: on xarelto  Disposition: Transition to PO antibiotics today. Anticipate discharge to SNF today or tomorrow.   Subjective:  Legs are feeling somewhat tight today. Scrotal pain improving.  Objective: Temp:  [97.8 F (36.6 C)-98.5 F (36.9 C)] 97.9 F (36.6 C) (06/19 0436) Pulse Rate:  [57-73] 63 (06/19 0555) Resp:  [18-20] 18 (06/19 0436) BP: (97-122)/(45-67) 114/49 mmHg (06/19 0555) SpO2:  [91 %-98 %] 97 % (06/19 0436) Weight:  [368 lb 14.4 oz (167.332 kg)] 368 lb 14.4 oz (167.332 kg) (06/19 0436) Physical Exam: General: Morbidly obese male, lying in bed  Cardiovascular: Irregularly irregular, S1, S2, no m/r/g Respiratory: CTAB, moving air well throughout  Abdomen: +BS, soft, ND, mild diffuse TTP  GU: Scrotum edematous but no longer erythematous. About the size of an orange today. Exam limited by body habitus. I&D incision site clean. No active drainage noted.  MSK: LE strength 5-/5 on the left and 5-/5 on the right. He can wiggle his toes. Abrasions across both feet. No LE edema.  Skin: Very dry skin across feet with PAD changes, bruised toenails.  Neuro: AOx3. Decreased sensation to touch to ankle of R LE and to mid-shin to L LE.  Psych: Appropriate mood and affect.   Laboratory:  Recent Labs Lab 12/27/15 0556 12/28/15 0258 12/29/15 0334  WBC 12.1* 14.1* 14.3*  HGB 11.4* 10.3* 11.1*  HCT 36.2* 33.4* 34.8*  PLT 279 294 285    Recent Labs Lab  12/28/15 1127 12/28/15 1618 12/29/15 0001 12/29/15 0423 12/29/15 0645  GLUCAP 367* 127* 207* 298* 307*      Recent Labs Lab 12/23/15 1310 12/24/15 0331  12/27/15 0556 12/28/15 0258 12/29/15 0334  NA 133* 135  < > 132* 133* 133*  K 6.0* 5.2*  < > 4.7 4.1 4.1  CL 95* 99*  < > 95* 96* 96*  CO2 23 26  < > 27 26 25   BUN 87* 71*  < > 29* 28* 27*  CREATININE 3.12* 2.56*  < > 1.51* 1.59* 1.68*  CALCIUM 8.9 8.8*  < > 9.5 8.9 9.2  PROT 6.6 6.7  --   --   --   --   BILITOT 1.7* 1.2  --   --   --   --   ALKPHOS 53 49  --   --   --   --   ALT 20 18  --   --   --   --   AST 71* 44*  --   --   --   --   GLUCOSE 445* 423*  < > 315* 251* 268*  < > = values in this interval not displayed.   Imaging/Diagnostic Tests: None new.   , MD 12/29/2015, 7:23  AM PGY-1, [facility] Intern pager: [phone], text pages welcome

## 2015-12-29 NOTE — Clinical Social Work Note (Signed)
CSW continues to follow for discharge needs. Plan is still for [facility] when medically stable.  Charlynn Court, CSW [phone]

## 2015-12-30 DIAGNOSIS — E669 Obesity, unspecified: Secondary | ICD-10-CM

## 2015-12-30 LAB — BASIC METABOLIC PANEL
ANION GAP: 9 (ref 5–15)
BUN: 25 mg/dL — ABNORMAL HIGH (ref 6–20)
CALCIUM: 9.1 mg/dL (ref 8.9–10.3)
CO2: 27 mmol/L (ref 22–32)
Chloride: 100 mmol/L — ABNORMAL LOW (ref 101–111)
Creatinine, Ser: 1.53 mg/dL — ABNORMAL HIGH (ref 0.61–1.24)
GFR, EST AFRICAN AMERICAN: 54 mL/min — AB (ref >60)
GFR, EST NON AFRICAN AMERICAN: 46 mL/min — AB (ref >60)
Glucose, Bld: 177 mg/dL — ABNORMAL HIGH (ref 65–99)
POTASSIUM: 3.8 mmol/L (ref 3.5–5.1)
SODIUM: 136 mmol/L (ref 135–145)

## 2015-12-30 LAB — CBC
HEMATOCRIT: 36 % — AB (ref 39.0–52.0)
HEMOGLOBIN: 11 g/dL — AB (ref 13.0–17.0)
MCH: 24.1 pg — AB (ref 26.0–34.0)
MCHC: 30.6 g/dL (ref 30.0–36.0)
MCV: 78.9 fL (ref 78.0–100.0)
Platelets: 298 10*3/uL (ref 150–400)
RBC: 4.56 MIL/uL (ref 4.22–5.81)
RDW: 16.5 % — AB (ref 11.5–15.5)
WBC: 13.9 10*3/uL — ABNORMAL HIGH (ref 4.0–10.5)

## 2015-12-30 LAB — GLUCOSE, CAPILLARY
GLUCOSE-CAPILLARY: 208 mg/dL — AB (ref 65–99)
GLUCOSE-CAPILLARY: 254 mg/dL — AB (ref 65–99)
Glucose-Capillary: 207 mg/dL — ABNORMAL HIGH (ref 65–99)

## 2015-12-30 MED ORDER — INSULIN REGULAR HUMAN (CONC) 500 UNIT/ML ~~LOC~~ SOLN: 40.0000 [IU] | Freq: Three times a day (TID) | SUBCUTANEOUS | Status: DC

## 2015-12-30 MED ORDER — DOXYCYCLINE HYCLATE 100 MG PO TABS: 100.0000 mg | ORAL_TABLET | Freq: Two times a day (BID) | ORAL | Status: DC

## 2015-12-30 MED ORDER — INSULIN DETEMIR 100 UNIT/ML ~~LOC~~ SOLN: 30.0000 [IU] | Freq: Two times a day (BID) | SUBCUTANEOUS | Status: DC

## 2015-12-30 NOTE — Discharge Summary (Signed)
Family Medicine Teaching [facility] Discharge Summary  Patient name: [name] Medical record number: [id number] Date of birth: May 19, 1951 Age: 65 y.o. Gender: male Date of Admission: 12/23/2015  Date of Discharge: 12/30/2015 Admitting Physician: Doreene Eland, MD  Primary Care Provider: [name], MD Consultants: Orthopedic Surgery, Gastroenterology, Urology  Indication for Hospitalization: pain after fall, AKI  Discharge Diagnoses/Problem List:  Active Problems:  Acute kidney injury   Back pain  Rhabdomyolysis  Foot fracture (chronic)  Type 2 diabetes mellitus with complication, with long-term current use of insulin   Lower GI bleed  Scrotal Abscess  Disposition: SNF  Discharge Condition: Improved  Discharge Exam:  Blood pressure 110/41, pulse 69, temperature 97.7 F (36.5 C), temperature source Oral, resp. rate 18, height 6' (1.829 m), weight 372 lb 14.4 oz (169.146 kg), SpO2 94 %. Physical Exam: General: Morbidly obese male, resting in bed Cardiovascular: Irregularly irregular, S1, S2, no m/r/g Respiratory: CTAB, moving air well throughout  Abdomen: +BS, soft, ND, mildly TTP over LUQ MSK: Able to change position in bed with minimal pain. TTP over SI joints. LE strength 5-/5 on the left and 5-/5 on the right. He can wiggle his toes. Abrasions across both feet. No LE edema. Skin: Very dry skin across feet, bruised toenails. Venous stasis changes to shins bilaterally. Neuro: AOx3. Decreased sensation to touch to ankle of R LE and to mid-shin to L LE.  Psych: Appropriate mood and affect.   Brief Hospital Course:  [name] is a 66 y.o. male who presented with back pain and hip pain after fall. PMH is significant for chronic diastolic CHF, CAD (s/p PCI to RCA in 2012, prior cath in 01/2014 with elevated L/RH pressures, mild-mod CAD of LAD/LCx with patent RCA, normal EF, c/b CIN/CHF), morbid obesity, paroxysmal atrial fibrillation, OSA  (unable to tolerate CPAP), DM, HLD, anemia, CKD stage III, LE neuropathy and numbness, and deconditioning.   Obesity: Patient had previously been evaluated for gastric bypass about 2 years ago, per patient, and was approved initially then denied due to insurance issue. His chronic pain and uncontrolled diabetes would benefit greatly from weight loss.   Back pain after fall:  Imaging of hips, lumbar and thoracic spine, and feet unremarkable except for right proximal metatarsal fracture likely chronic with possible Lisfranc injury, which was evaluated by Orthopedic Surgery and determined to be a 5th MT fracture without lisfranc fracture or charcot collapse. Patient was given home oxycodone 20 mg q6h as needed and IV fentanyl for breakthrough pain. Pain and mobility improved at time of discharge. Physical Therapy and Occupational Therapy recommended SNF placement for improved mobility and 24-hour supervision.   Rhabdomyolysis:  Patient had prolonged lie after fall. CK was 3871 on admission, and there was moderate hemoglobin on UA. He was given IVFs until CK was below 1000.  Acute on Chronic Kidney Disease: Likely ATN in setting of rhabdomyolysis. Pre-renal contribution as well with BUN of 87. Held home lasix and metolazone. At baseline (~1.5) at discharge with SCr of 1.53.   T2DM: Patient's last A1c was 8.7 on 10/2015. He reports taking Levemir 20U daily and Humulin R 100 units TID with meals, which he says he requires at home to "chase his sugars" as he is nonadherent to carb-modified diet and drinks a lot of Pepsi at home. CBGs were elevated to 450 in the ED. Anion gap was watched closely but never developed greater than 15. He had ketones on UA but in setting of dehydration.  His levemir was increased to 30 units in the morning and 30 units in the evening, based on sliding scale insulin needed in addition to 40 U of Humulin (lower dose given due to contradictory prescription history and preference to  not bottom out patient's CBGs). Patient expressed desire to resume novolog instead of humulin, as he felt he had better control of his sugars and less confusing of a regimen on novolog in the past. He will be discharged on levemir 30 units BID and 40 units humulin TID with meals with recommendation to continue sliding scale insulin (resistant scale) to determine how to adjust long-acting insulin and ultimately reduce short-acting insulin needs in the future such that patient can use unconcentrated insulin.  Scrotal Abscess:  Patient complained of scrotal pain, which was evaluated by [place], showing right testicular cyst and small left hydrocele, and by CT, showing 4 cm abscess versus phlegmon of posterior scrotum. Urology was consulted and performed a scrotal I&D on 12/26/17. Urology recommended changing packing daily for 1-2 weeks and treating with antibiotics. Patient received 2 days of IV antibiotics (vancomycin and zosyn) and was transitioned to doxycycline 12/29/15. Normal skin flora grew from abscess culture, but given patient's poorly controlled diabetes, continue doxycycline x 5 days (through 6/23).   Hyperkalemia: To 6.0 on admission. K 3.8 upon discharge. EKGs without peaked T waves. Resolved with resumption of insulin therapy (patient had had lapse after fall).   Microcytic anemia:  Stable at 11.0 upon discharge (Baseline ~ 10). Denies dark stools, BRB in stool to this examiner but reported 3 month history of BRB in stools to Attending physician. FOBT positive x 2. Gastroenterology was consulted for possible inpatient EGD and colonoscopy, as had previously been requested by patient's PCP, but these procedures will be performed outpatient (though in the hospital for anesthesia support) and scheduled in the near future, as patient's hemoglobin is stable. Will continue xarelto for patient's atrial fibrillation upon discharge.   Chronic Right Foot Fracture: Orthopedic Surgery recommended  weight-bearing as tolerated to right foot with post op shoe or supportive walking shoe (no sandals or bare foot ambulation). Post-op shoe provided, but patient prefers his own tennis shoes.   Wound Care: Patient with abrasions across feet after fall and chronic full thickness wound of upper buttock after sebaceous cyst drained. Leave buttock and toe wounds open to air, applying eucerin cream to crusted areas of skin as needed and bactroban ointment to healing toe wounds. Scrotal abscess to be packed daily x 1-2 weeks.   CAD/HLD: Patient's crestor was switched to atorvastatin due to renal function.   CHF: No evidence of fluid overload on exam. Will not continue home lasix and metolazone upon discharge. May benefit from compression stockings for venous stasis changes.   Issues for Follow Up:  1. Continue to titrate insulin regimen, with preference to increase levemir with lower doses of mealtime coverage. 2. Inquire about GI follow-up appointment for EGD and colonoscopy.  3. Monitor for soft blood pressures. Could decrease dose of metoprolol if persistent. 4. Consider compression stockings for sensation of leg tightness. 5. Arrange outpatient follow-up for consideration of weight loss surgery.  Significant Procedures: scrotal I&D on 12/26/17  Significant Labs and Imaging:   Recent Labs Lab 12/28/15 0258 12/29/15 0334 12/30/15 0244  WBC 14.1* 14.3* 13.9*  HGB 10.3* 11.1* 11.0*  HCT 33.4* 34.8* 36.0*  PLT 294 285 298    Recent Labs Lab 12/23/15 1310 12/24/15 0331  12/26/15 1920 12/27/15 0556 12/28/15 0258 12/29/15 0981  12/30/15 0244  NA 133* 135  < > 133* 132* 133* 133* 136  K 6.0* 5.2*  < > 4.4 4.7 4.1 4.1 3.8  CL 95* 99*  < > 97* 95* 96* 96* 100*  CO2 23 26  < > 27 27 26 25 27   GLUCOSE 445* 423*  < > 396* 315* 251* 268* 177*  BUN 87* 71*  < > 33* 29* 28* 27* 25*  CREATININE 3.12* 2.56*  < > 1.80* 1.51* 1.59* 1.68* 1.53*  CALCIUM 8.9 8.8*  < > 9.2 9.5 8.9 9.2 9.1   ALKPHOS 53 49  --   --   --   --   --   --   AST 71* 44*  --   --   --   --   --   --   ALT 20 18  --   --   --   --   --   --   ALBUMIN 3.1* 3.0*  --   --   --   --   --   --   < > = values in this interval not displayed.  Results/Tests Pending at Time of Discharge: Final blood cultures and abscess culture results.   Discharge Medications:    Medication List    STOP taking these medications        furosemide 80 MG tablet  Commonly known as:  LASIX     metolazone 5 MG tablet  Commonly known as:  ZAROXOLYN     rosuvastatin 40 MG tablet  Commonly known as:  CRESTOR      TAKE these medications        amiodarone 200 MG tablet  Commonly known as:  PACERONE  Take 200 mg by mouth daily.     atorvastatin 80 MG tablet  Commonly known as:  LIPITOR  Take 1 tablet (80 mg total) by mouth daily.     doxycycline 100 MG tablet  Commonly known as:  VIBRA-TABS  Take 1 tablet (100 mg total) by mouth every 12 (twelve) hours.     gabapentin 300 MG capsule  Commonly known as:  NEURONTIN  Take 2 capsules (600 mg total) by mouth 2 (two) times daily.     hydrocerin Crea  Apply 1 application topically daily.     insulin aspart 100 UNIT/ML injection  Commonly known as:  novoLOG  Inject 0-20 Units into the skin 3 (three) times daily with meals.     insulin detemir 100 UNIT/ML injection  Commonly known as:  LEVEMIR  Inject 0.3 mLs (30 Units total) into the skin 2 (two) times daily.     insulin regular human CONCENTRATED 500 UNIT/ML injection  Commonly known as:  HUMULIN R  Inject 0.08 mLs (40 Units total) into the skin 3 (three) times daily before meals.     lisinopril 10 MG tablet  Commonly known as:  PRINIVIL,ZESTRIL  Take 1 tablet (10 mg total) by mouth daily.     metoprolol 50 MG tablet  Commonly known as:  LOPRESSOR  Take 50 mg by mouth 2 (two) times daily. TWO TABLETS TWICE A DAY     multivitamin with minerals Tabs tablet  Take 1 tablet by mouth daily.     mupirocin  cream 2 %  Commonly known as:  BACTROBAN  Apply topically 2 (two) times daily.     NATURAL BALANCE TEARS OP  Place 1 drop into both eyes daily as needed (for dry eyes).  Oxycodone HCl 20 MG Tabs  Take 1 tablet (20 mg total) by mouth every 6 (six) hours as needed for severe pain.     polyethylene glycol packet  Commonly known as:  MIRALAX / GLYCOLAX  Take 17 g by mouth daily as needed for mild constipation.     potassium chloride SA 20 MEQ tablet  Commonly known as:  KLOR-CON M20  Take 1 tablet (20 mEq total) by mouth daily.     XARELTO 20 MG Tabs tablet  Generic drug:  rivaroxaban  TAKE 1 TABLET (20 MG TOTAL) BY MOUTH DAILY WITH SUPPER.        Discharge Instructions: Please refer to Patient Instructions section of EMR for full details.  Patient was counseled important signs and symptoms that should prompt return to medical care, changes in medications, dietary instructions, activity restrictions, and follow up appointments.   Follow-Up Appointments:     Follow-up Information    Follow up with DUDA,MARCUS V, MD In 1 week.   Specialty:  Orthopedic Surgery   Contact information:   [address] [place] [address] [phone]       Follow up with [facility] .   Specialty:  Skilled Nursing Facility   Contact information:   [address] [place] [address] [phone]      Follow up with Hildred Laser, MD In 2 weeks.   Specialty:  Urology   Why:  Urology  - scrotal incision wound check after drainage   Contact information:   [address] [place] [address] [phone]       Casey Burkitt, MD 12/30/2015, 12:35 PM PGY-1, [facility]

## 2015-12-30 NOTE — Clinical Social Work Note (Signed)
CSW facilitated patient discharge including contacting patient family and facility to confirm patient discharge plans. Clinical information faxed to facility and family agreeable with plan. CSW arranged ambulance transport via [facility] to [facility]. RN to call report prior to discharge ([phone]).  CSW will sign off for now as social work intervention is no longer needed. Please consult us again if new needs arise.  Kyair Ditommaso, CSW [phone]   

## 2015-12-30 NOTE — Discharge Summary (Deleted)
Family Medicine Teaching [facility] Discharge Summary  Patient name: [name] Medical record number: [id number] Date of birth: 1950/08/05 Age: 65 y.o. Gender: male Date of Admission: 12/23/2015  Date of Discharge: 12/30/2015 Admitting Physician: Doreene Eland, MD  Primary Care Provider: [name], MD Consultants: Orthopedic Surgery, Gastroenterology, Urology  Indication for Hospitalization: pain after fall, AKI  Discharge Diagnoses/Problem List:  Active Problems:  Acute kidney injury   Back pain  Rhabdomyolysis  Foot fracture (chronic)  Type 2 diabetes mellitus with complication, with long-term current use of insulin   Lower GI bleed   Scrotal Abscess  Disposition: SNF  Discharge Condition: Improved  Discharge Exam:  Blood pressure 110/41, pulse 69, temperature 97.7 F (36.5 C), temperature source Oral, resp. rate 18, height 6' (1.829 m), weight 372 lb 14.4 oz (169.146 kg), SpO2 94 %. Physical Exam: General: Morbidly obese male, resting in bed Cardiovascular: Irregularly irregular, S1, S2, no m/r/g Respiratory: CTAB, moving air well throughout  Abdomen: +BS, soft, ND, mildly TTP over LUQ MSK: Able to change position in bed with minimal pain. TTP over SI joints. LE strength 5-/5 on the left and 5-/5 on the right. He can wiggle his toes. Abrasions across both feet. No LE edema. Skin: Very dry skin across feet, bruised toenails. Venous stasis changes to shins bilaterally. Neuro: AOx3. Decreased sensation to touch to ankle of R LE and to mid-shin to L LE.  Psych: Appropriate mood and affect.   Brief Hospital Course:  [name] is a 65 y.o. male who presented with back pain and hip pain after fall. PMH is significant for chronic diastolic CHF, CAD (s/p PCI to RCA in 2012, prior cath in 01/2014 with elevated L/RH pressures, mild-mod CAD of LAD/LCx with patent RCA, normal EF, c/b CIN/CHF), morbid obesity, paroxysmal atrial fibrillation, OSA  (unable to tolerate CPAP), DM, HLD, anemia, CKD stage III, LE neuropathy and numbness, and deconditioning.   Obesity: Patient had previously been evaluated for gastric bypass about 2 years ago, per patient, and was approved initially then denied due to insurance issue. His chronic pain and uncontrolled diabetes would benefit greatly from weight loss.   Back pain after fall:  Imaging of hips, lumbar and thoracic spine, and feet unremarkable except for right proximal metatarsal fracture likely chronic with possible Lisfranc injury, which was evaluated by Orthopedic Surgery and determined to be a 5th MT fracture without lisfranc fracture or charcot collapse. Patient was given home oxycodone 20 mg q6h as needed and IV fentanyl for breakthrough pain. Pain and mobility improved at time of discharge. Physical Therapy and Occupational Therapy recommended SNF placement for improved mobility and 24-hour supervision.   Rhabdomyolysis:  Patient had prolonged lie after fall. CK was 3871 on admission, and there was moderate hemoglobin on UA. He was given IVFs until CK was below 1000.  Acute on Chronic Kidney Disease: Likely ATN in setting of rhabdomyolysis. Pre-renal contribution as well with BUN of 87. Held home lasix and metolazone. At baseline (~1.5) at discharge with SCr of 1.53.   T2DM: Patient's last A1c was 8.7 on 10/2015. He reports taking Levemir 20U daily and Humulin R 100 units TID with meals, which he says he requires at home to "chase his sugars" as he is nonadherent to carb-modified diet and drinks a lot of Pepsi at home. CBGs were elevated to 450 in the ED. Anion gap was watched closely but never developed greater than 15. He had ketones on UA but in setting of  dehydration. His levemir was increased to 30 units in the morning and 30 units in the evening, based on sliding scale insulin needed in addition to 40 U of Humulin (lower dose given due to contradictory prescription history and preference to  not bottom out patient's CBGs). Patient expressed desire to resume novolog instead of humulin, as he felt he had better control of his sugars and less confusing of a regimen on novolog in the past. He will be discharged on levemir 30 units BID and 40 units humulin TID with meals with recommendation to continue sliding scale insulin (resistant scale) to determine how to adjust long-acting insulin and ultimately reduce short-acting insulin needs in the future such that patient can use unconcentrated insulin.  Scrotal Abscess:  Patient complained of scrotal pain, which was evaluated by [place], showing right testicular cyst and small left hydrocele, and by CT, showing 4 cm abscess versus phlegmon of posterior scrotum. Urology was consulted and performed a scrotal I&D on 12/26/17. Urology recommended changing packing daily for 1-2 weeks and treating with antibiotics. Patient received 2 days of IV antibiotics (vancomycin and zosyn) and was transitioned to doxycycline 12/29/15. Normal skin flora grew from abscess culture, but given patient's poorly controlled diabetes, continue doxycycline x 5 days (through 6/23).   Hyperkalemia: To 6.0 on admission. K 3.8 upon discharge. EKGs without peaked T waves. Resolved with resumption of insulin therapy (patient had had lapse after fall).   Microcytic anemia:  Stable at 11.0 upon discharge (Baseline ~ 10). Denies dark stools, BRB in stool to this examiner but reported 3 month history of BRB in stools to Attending physician. FOBT positive x 2. Gastroenterology was consulted for possible inpatient EGD and colonoscopy, as had previously been requested by patient's PCP, but these procedures will be performed outpatient (though in the hospital for anesthesia support) and scheduled in the near future, as patient's hemoglobin is stable. Will continue xarelto for patient's atrial fibrillation upon discharge.   Chronic Right Foot Fracture: Orthopedic Surgery recommended  weight-bearing as tolerated to right foot with post op shoe or supportive walking shoe (no sandals or bare foot ambulation). Post-op shoe provided, but patient prefers his own tennis shoes.   Wound Care: Patient with abrasions across feet after fall and chronic full thickness wound of upper buttock after sebaceous cyst drained. Leave buttock and toe wounds open to air, applying eucerin cream to crusted areas of skin as needed and bactroban ointment to healing toe wounds. Scrotal abscess to be packed daily x 1-2 weeks.   CAD/HLD: Patient's crestor was switched to atorvastatin due to renal function.   CHF: No evidence of fluid overload on exam. Will not continue home lasix and metolazone upon discharge. May benefit from compression stockings for venous stasis changes.   Issues for Follow Up:  1. Continue to titrate insulin regimen, with preference to increase levemir with lower doses of mealtime coverage. 2. Inquire about GI follow-up appointment for EGD and colonoscopy.  3. Monitor for soft blood pressures. Could decrease dose of metoprolol if persistent. 4. Consider compression stockings for sensation of leg tightness. 5. Arrange outpatient follow-up for consideration of weight loss surgery.   Significant Procedures: scrotal I&D on 12/26/17  Significant Labs and Imaging:   Recent Labs Lab 12/28/15 0258 12/29/15 0334 12/30/15 0244  WBC 14.1* 14.3* 13.9*  HGB 10.3* 11.1* 11.0*  HCT 33.4* 34.8* 36.0*  PLT 294 285 298    Recent Labs Lab 12/23/15 1310 12/24/15 0331  12/26/15 1920 12/27/15 0556 12/28/15 0258  12/29/15 0334 12/30/15 0244  NA 133* 135  < > 133* 132* 133* 133* 136  K 6.0* 5.2*  < > 4.4 4.7 4.1 4.1 3.8  CL 95* 99*  < > 97* 95* 96* 96* 100*  CO2 23 26  < > 27 27 26 25 27   GLUCOSE 445* 423*  < > 396* 315* 251* 268* 177*  BUN 87* 71*  < > 33* 29* 28* 27* 25*  CREATININE 3.12* 2.56*  < > 1.80* 1.51* 1.59* 1.68* 1.53*  CALCIUM 8.9 8.8*  < > 9.2 9.5 8.9 9.2 9.1   ALKPHOS 53 49  --   --   --   --   --   --   AST 71* 44*  --   --   --   --   --   --   ALT 20 18  --   --   --   --   --   --   ALBUMIN 3.1* 3.0*  --   --   --   --   --   --   < > = values in this interval not displayed.  Results/Tests Pending at Time of Discharge: Final blood cultures and abscess culture results.   Discharge Medications:    Medication List    STOP taking these medications        insulin regular human CONCENTRATED 500 UNIT/ML injection  Commonly known as:  HUMULIN R     rosuvastatin 40 MG tablet  Commonly known as:  CRESTOR      TAKE these medications        amiodarone 200 MG tablet  Commonly known as:  PACERONE  Take 200 mg by mouth daily.     atorvastatin 80 MG tablet  Commonly known as:  LIPITOR  Take 1 tablet (80 mg total) by mouth daily.     furosemide 80 MG tablet  Commonly known as:  LASIX  Take 1 tablet (80 mg total) by mouth 2 (two) times daily.     gabapentin 300 MG capsule  Commonly known as:  NEURONTIN  Take 2 capsules (600 mg total) by mouth 2 (two) times daily.     hydrocerin Crea  Apply 1 application topically daily.     insulin aspart 100 UNIT/ML injection  Commonly known as:  NOVOLOG  Inject 20 Units into the skin 3 (three) times daily with meals.     insulin aspart 100 UNIT/ML injection  Commonly known as:  novoLOG  Inject 0-20 Units into the skin 3 (three) times daily with meals.     insulin detemir 100 UNIT/ML injection  Commonly known as:  LEVEMIR  Inject 0.3 mLs (30 Units total) into the skin at bedtime.     insulin detemir 100 UNIT/ML injection  Commonly known as:  LEVEMIR  Inject 0.1 mLs (10 Units total) into the skin every morning.     lisinopril 10 MG tablet  Commonly known as:  PRINIVIL,ZESTRIL  Take 1 tablet (10 mg total) by mouth daily.     metolazone 5 MG tablet  Commonly known as:  ZAROXOLYN  Take 1 tablet (5 mg total) by mouth daily. TAKE 30 MINUTES BEFORE TAKING LASIX     metoprolol 50 MG tablet   Commonly known as:  LOPRESSOR  Take 50 mg by mouth 2 (two) times daily. TWO TABLETS TWICE A DAY     multivitamin with minerals Tabs tablet  Take 1 tablet by mouth daily.  mupirocin cream 2 %  Commonly known as:  BACTROBAN  Apply topically 2 (two) times daily.     NATURAL BALANCE TEARS OP  Place 1 drop into both eyes daily as needed (for dry eyes).     Oxycodone HCl 20 MG Tabs  Take 1 tablet (20 mg total) by mouth every 6 (six) hours as needed for severe pain.     polyethylene glycol packet  Commonly known as:  MIRALAX / GLYCOLAX  Take 17 g by mouth daily as needed for mild constipation.     potassium chloride SA 20 MEQ tablet  Commonly known as:  KLOR-CON M20  Take 1 tablet (20 mEq total) by mouth daily.     XARELTO 20 MG Tabs tablet  Generic drug:  rivaroxaban  TAKE 1 TABLET (20 MG TOTAL) BY MOUTH DAILY WITH SUPPER.        Discharge Instructions: Please refer to Patient Instructions section of EMR for full details.  Patient was counseled important signs and symptoms that should prompt return to medical care, changes in medications, dietary instructions, activity restrictions, and follow up appointments.   Follow-Up Appointments:     Follow-up Information    Follow up with DUDA,MARCUS V, MD In 1 week.   Specialty:  Orthopedic Surgery   Contact information:   [address] [place] [address] [phone]       Follow up with [facility] .   Specialty:  Skilled Nursing Facility   Contact information:   [address] [place] [address] [phone]      Follow up with Hildred Laser, MD In 2 weeks.   Specialty:  Urology   Why:  Urology  - scrotal incision wound check after drainage   Contact information:   [address] [place] [address] [phone]       Casey Burkitt, MD 12/30/2015, 7:06 AM PGY-1, [facility] Family Medicine

## 2015-12-30 NOTE — Progress Notes (Signed)
Occupational Therapy Treatment Patient Details Name: [name] MRN: [id number] DOB: 1950-09-19 Today's Date: 12/30/2015    History of present illness 65 Y/O M with Pmx of DM2, HLD, morbid obesity, HTN, OSA, CAD, HFpEF,CKD III, Afib presented with hx of fall more than 12 hours ago with leg pain upon trying to get up. Found to have AKI and rhabdomyolsis. Pt found to have fx of rt 5th metatarsal.   OT comments  Pt demonstrates ability to perform ADLs with min a - min guard assist, but refuses today to do anything but ambulate in hallway.  Pt refuses to wear shoes, or post op shoes, and will not don sock - states "they bust by feet open", but acknowledges that he has not attempted to wear his personal shoes.  Provided education on risk of non healing, injury, and infection, but pt continues to refuse recommendation.     Follow Up Recommendations  SNF;Other (comment)    Equipment Recommendations  3 in 1 bedside comode    Recommendations for Other Services      Precautions / Restrictions Precautions Precautions: Fall Required Braces or Orthoses: Other Brace/Splint Other Brace/Splint: post-op shoe or supportive walking shoe on rt Restrictions Weight Bearing Restrictions: No RLE Weight Bearing: Weight bearing as tolerated LLE Weight Bearing: Weight bearing as tolerated       Mobility Bed Mobility                  Transfers Overall transfer level: Needs assistance Equipment used: None Transfers: Sit to/from Stand;Stand Pivot Transfers Sit to Stand: Supervision Stand pivot transfers: Supervision            Balance Overall balance assessment: Needs assistance Sitting-balance support: Feet supported Sitting balance-Leahy Scale: Good     Standing balance support: No upper extremity supported Standing balance-Leahy Scale: Good                     ADL Overall ADL's : Needs assistance/impaired                         Toilet Transfer: Min  guard;Ambulation;Comfort height toilet;Grab bars   Toileting- Clothing Manipulation and Hygiene: Min guard;Sit to/from stand       Functional mobility during ADLs: Min guard;Supervision/safety General ADL Comments: Pt refused to participate in further ADLs.  He refuses to don socks or shoes stating they "bust open" the wounds on his toes.  He reports post op shoe is too small, and he has his own shoes which he reports were brought to him yesterday.  He is very indignant when asked if he has tried to don those, stating "I just got them yesterday".   When encouraged to wear them, he inisists they "bust open" his wounds, even though he hasn't worn them.  Explained infection risk to him and risk for futher injury when walking barefooted. Pt shrugged.       Vision                     Perception     Praxis      Cognition   Behavior During Therapy: Agitated Overall Cognitive Status: Within Functional Limits for tasks assessed                       Extremity/Trunk Assessment               Exercises     Shoulder Instructions  General Comments      Pertinent Vitals/ Pain       Pain Assessment: Faces Faces Pain Scale: Hurts little more Pain Location: scrotum Pain Descriptors / Indicators: Grimacing Pain Intervention(s): Monitored during session  Home Living                                          Prior Functioning/Environment              Frequency Min 2X/week     Progress Toward Goals  OT Goals(current goals can now be found in the care plan section)     ADL Goals Pt Will Perform Grooming: with set-up;with supervision;standing Pt Will Perform Lower Body Dressing: with set-up;with supervision;with adaptive equipment;sit to/from stand Pt Will Transfer to Toilet: with supervision;ambulating;bedside commode Pt Will Perform Toileting - Clothing Manipulation and hygiene: with min assist Additional ADL Goal #1: Pt will be  aware of energy conservation strategies that will be of benefit to him Additional ADL Goal #2: Pt will be S in and OOB with HOB all the way up, no rail  Plan Discharge plan remains appropriate    Co-evaluation                 End of Session     Activity Tolerance Other (comment) (self limiting behaviors )   Patient Left in chair;with call bell/phone within reach   Nurse Communication Mobility status        Time: 5638-7564 OT Time Calculation (min): 8 min  Charges: OT General Charges $OT Visit: 1 Procedure OT Treatments $Therapeutic Activity: 8-22 mins $Neuromuscular Re-education: 8-22 mins  [name] M 12/30/2015, 1:15 PM

## 2015-12-30 NOTE — Clinical Social Work Placement (Signed)
   CLINICAL SOCIAL WORK PLACEMENT  NOTE  Date:  12/30/2015  Patient Details  Name: [name] MRN: [id number] Date of Birth: 1951-01-14  Clinical Social Work is seeking post-discharge placement for this patient at the Skilled  Nursing Facility level of care (*CSW will initial, date and re-position this form in  chart as items are completed):  Yes   Patient/family provided with [place] Clinical Social Work Department's list of facilities offering this level of care within the geographic area requested by the patient (or if unable, by the patient's family).  Yes   Patient/family informed of their freedom to choose among providers that offer the needed level of care, that participate in Medicare, Medicaid or managed care program needed by the patient, have an available bed and are willing to accept the patient.  Yes   Patient/family informed of [facility]'s ownership interest in [facility] and [facility], as well as of the fact that they are under no obligation to receive care at these facilities.  PASRR submitted to EDS on 12/24/15     PASRR number received on 12/24/15     Existing PASRR number confirmed on       FL2 transmitted to all facilities in geographic area requested by pt/family on 12/24/15     FL2 transmitted to all facilities within larger geographic area on       Patient informed that his/her managed care company has contracts with or will negotiate with certain facilities, including the following:            Patient/family informed of bed offers received.  Patient chooses bed at [facility]     Physician recommends and patient chooses bed at      Patient to be transferred to [facility] on 12/30/15.  Patient to be transferred to facility by [facility]     Patient family notified on 12/30/15 of transfer.  Name of family member notified:  Cindy     PHYSICIAN Please prepare prescriptions     Additional Comment:     _______________________________________________ Margarito Liner, LCSW 12/30/2015, 1:38 PM

## 2015-12-30 NOTE — Progress Notes (Signed)
Report called and given to Sport and exercise psychologist at [facility] Stanford Breed RN 2:18 PM  12-30-2015

## 2016-01-01 LAB — CULTURE, BLOOD (ROUTINE X 2)
Culture: NO GROWTH
Culture: NO GROWTH

## 2016-01-02 LAB — AEROBIC/ANAEROBIC CULTURE W GRAM STAIN (SURGICAL/DEEP WOUND): Culture: NORMAL

## 2016-01-20 ENCOUNTER — Ambulatory Visit: Payer: Medicare Other | Admitting: Internal Medicine

## 2016-01-25 ENCOUNTER — Other Ambulatory Visit: Payer: Self-pay | Admitting: Cardiology

## 2016-01-26 NOTE — Telephone Encounter (Signed)
Rx(s) sent to pharmacy electronically.  

## 2016-01-30 ENCOUNTER — Telehealth: Payer: Self-pay | Admitting: Internal Medicine

## 2016-01-30 ENCOUNTER — Encounter: Payer: Self-pay | Admitting: *Deleted

## 2016-01-30 NOTE — Telephone Encounter (Signed)
Lyla Son Nurse is calling to request orders wet to dry 3 x per week , wound care & medications verification . Please, call her  At [phone]  Thank You .

## 2016-02-02 ENCOUNTER — Telehealth: Payer: Self-pay | Admitting: Internal Medicine

## 2016-02-02 NOTE — Telephone Encounter (Signed)
Left vm

## 2016-02-02 NOTE — Telephone Encounter (Signed)
Marcelino Duster calling for a verbal authorization for OT home health to continue with pt

## 2016-02-02 NOTE — Telephone Encounter (Signed)
[employer]   Requesting orders for a Child psychotherapist, clarify insulin order And the frequency order 1 time a week and 3 times a week for wound care

## 2016-02-03 NOTE — Telephone Encounter (Signed)
dw pt's hhc, [name],   Scrotal abscess, wet/dry dressing 3 week, social worker verbal order. Pt/ot.

## 2016-02-03 NOTE — Telephone Encounter (Signed)
[facility], confirmed pt for OT. approved

## 2016-02-03 NOTE — Telephone Encounter (Signed)
Attempted to call Lyla Son again, went straight to vm. Left message. Will close this query.

## 2016-02-03 NOTE — Telephone Encounter (Signed)
Michelle called

## 2016-02-04 ENCOUNTER — Ambulatory Visit: Payer: Medicare Other | Admitting: Gastroenterology

## 2016-02-04 ENCOUNTER — Telehealth: Payer: Self-pay | Admitting: Internal Medicine

## 2016-02-04 ENCOUNTER — Telehealth: Payer: Self-pay | Admitting: Gastroenterology

## 2016-02-04 NOTE — Telephone Encounter (Signed)
Monique from [facility] called requesting verbal order for PT 2 times a week for 3 weeks, for balance training. Please f/up at [phone]

## 2016-02-04 NOTE — Telephone Encounter (Signed)
patient had to cancel todays appointment stating that he doesn't feel well enough to come in. Next available is in September.    In appointment notes it states that patient has upcoming hospital procedure. Does patient need to be seen before then? If so, is it ok for patient to see an APP?

## 2016-02-04 NOTE — Telephone Encounter (Signed)
Dr Armbruster-  Please advise... 

## 2016-02-05 NOTE — Telephone Encounter (Signed)
He should be seen in the clinic for reassessment. In looking through his chart, I believe he is a patient of Dr. Marina Goodell. I saw him during his inpatient stay. Can you coordinate a clinic follow up for him? Thanks

## 2016-02-06 ENCOUNTER — Other Ambulatory Visit: Payer: Self-pay | Admitting: Internal Medicine

## 2016-02-06 ENCOUNTER — Other Ambulatory Visit: Payer: Self-pay | Admitting: Cardiology

## 2016-02-06 NOTE — Telephone Encounter (Signed)
Rx Request 

## 2016-02-06 NOTE — Telephone Encounter (Signed)
I have spoken to the patient to advise that since he previously saw Dr Marina Goodell in the office, we should have him follow up with him or on of his PA's as they do not share patients. Since Dr Lamar Sprinkles next available appointment is not until 03-2016, I have scheduled him to see Doug Sou, PA-C for hospital follow up on 02-10-16 at 3 pm. I have also spoken to [facility] at [facility] endoscopy to cancel endo/colon with Dr Adela Lank and have explained this to patient as well. Patient verbalizes understanding of all of the above.

## 2016-02-09 NOTE — Telephone Encounter (Signed)
Forward to Dr. Julien Nordmann

## 2016-02-09 NOTE — Telephone Encounter (Signed)
Monique calling to follow up with verbal authorization for pt to begin physical therapy  States she wants to get the pt in this week  Please follow up at [phone]

## 2016-02-10 ENCOUNTER — Telehealth: Payer: Self-pay | Admitting: Internal Medicine

## 2016-02-10 ENCOUNTER — Ambulatory Visit: Payer: Medicare Other | Admitting: Gastroenterology

## 2016-02-10 NOTE — Telephone Encounter (Signed)
Verbal order for social worker 1 time a week for 2 weeks  Please follow up.

## 2016-02-10 NOTE — Telephone Encounter (Signed)
Returned [place] from [facility] call she didn't answer lvm for her to give me a call back at her earliest convenience

## 2016-02-10 NOTE — Telephone Encounter (Signed)
[name] returned call and per Dr. Julien Nordmann give okay. Gave okay for verbal order for 1 time a week for 2 weeks

## 2016-02-10 NOTE — Telephone Encounter (Signed)
dw [place]. PT eval and treat verbal ok today

## 2016-02-16 ENCOUNTER — Telehealth: Payer: Self-pay | Admitting: Internal Medicine

## 2016-02-16 NOTE — Telephone Encounter (Signed)
Lyla Son from [facility] called stating that pt was sent to the urologist and the urologist stated to leave the wound open to air   States nursing is discharging him on Wednesday and needs authorization  Please call back. [phone]

## 2016-02-16 NOTE — Telephone Encounter (Signed)
Contacted [facility] home health and spoke with scott and they just wanted to keep [place] in the loop of what the urologist referred. Per Dr. Julien Nordmann it is okay to discharge him from nursing

## 2016-02-17 ENCOUNTER — Ambulatory Visit: Payer: Medicare Other | Admitting: Internal Medicine

## 2016-02-20 ENCOUNTER — Telehealth: Payer: Self-pay | Admitting: Internal Medicine

## 2016-02-20 NOTE — Telephone Encounter (Signed)
Will forward to pcp and make her aware

## 2016-02-20 NOTE — Telephone Encounter (Signed)
Dannielle Huh, physical therapist, with [facility] calling to inform PCP the pt canceled his physical therapist appointment for today, 8/11  States pt cancels these appointments often

## 2016-02-20 NOTE — Telephone Encounter (Signed)
Contacted Michelle and gave verbal order per Dr. Julien Nordmann

## 2016-02-20 NOTE — Telephone Encounter (Signed)
Marcelino Duster from [facility] called requesting a verbal order for OT b/c the pt. Canceled the visit for today. It was the pt. Last visit. Please f/u

## 2016-02-24 ENCOUNTER — Telehealth: Payer: Self-pay | Admitting: Internal Medicine

## 2016-02-24 NOTE — Telephone Encounter (Signed)
Monique returned call and gave verbal order for pt 2 times a week for 2 weeks per Dr. Julien Nordmann

## 2016-02-24 NOTE — Telephone Encounter (Signed)
Returned [place] call regarding pt. She did not answer lvm for her to give me a call back her earliest convenience

## 2016-02-24 NOTE — Telephone Encounter (Signed)
Monique from [facility] called requesting verbal order for PT 2 x a week for 2 wks. Please f/up @ [phone]

## 2016-02-25 ENCOUNTER — Other Ambulatory Visit: Payer: Self-pay | Admitting: Internal Medicine

## 2016-02-25 ENCOUNTER — Ambulatory Visit: Payer: Medicare Other | Admitting: Podiatry

## 2016-02-25 NOTE — Telephone Encounter (Signed)
Ok, thanks.

## 2016-02-27 ENCOUNTER — Telehealth: Payer: Self-pay | Admitting: Internal Medicine

## 2016-02-27 MED ORDER — ACCU-CHEK AVIVA PLUS VI STRP: ORAL_STRIP | 12 refills | Status: DC

## 2016-02-27 NOTE — Telephone Encounter (Signed)
Prescription needs diagnosis code. Please follow up.

## 2016-02-27 NOTE — Telephone Encounter (Signed)
Patient is needing test strips.

## 2016-02-27 NOTE — Telephone Encounter (Signed)
Strips sent with ICD10 code and specific instructions.

## 2016-03-02 ENCOUNTER — Encounter (HOSPITAL_COMMUNITY): Admission: RE | Payer: Self-pay | Source: Ambulatory Visit

## 2016-03-02 ENCOUNTER — Ambulatory Visit (HOSPITAL_COMMUNITY): Admission: RE | Admit: 2016-03-02 | Payer: Medicare Other | Source: Ambulatory Visit | Admitting: Gastroenterology

## 2016-03-02 SURGERY — COLONOSCOPY
Anesthesia: Monitor Anesthesia Care

## 2016-03-03 ENCOUNTER — Telehealth: Payer: Self-pay | Admitting: Endocrinology

## 2016-03-03 ENCOUNTER — Encounter: Payer: Self-pay | Admitting: Endocrinology

## 2016-03-03 ENCOUNTER — Ambulatory Visit (INDEPENDENT_AMBULATORY_CARE_PROVIDER_SITE_OTHER): Payer: Medicare Other | Admitting: Endocrinology

## 2016-03-03 VITALS — BP 92/56 | HR 91 | Ht 72.0 in | Wt 364.0 lb

## 2016-03-03 DIAGNOSIS — Z794 Long term (current) use of insulin: Secondary | ICD-10-CM

## 2016-03-03 DIAGNOSIS — R5383 Other fatigue: Secondary | ICD-10-CM

## 2016-03-03 DIAGNOSIS — E1121 Type 2 diabetes mellitus with diabetic nephropathy: Secondary | ICD-10-CM

## 2016-03-03 DIAGNOSIS — E1165 Type 2 diabetes mellitus with hyperglycemia: Secondary | ICD-10-CM

## 2016-03-03 DIAGNOSIS — E1159 Type 2 diabetes mellitus with other circulatory complications: Secondary | ICD-10-CM

## 2016-03-03 DIAGNOSIS — N183 Chronic kidney disease, stage 3 unspecified: Secondary | ICD-10-CM

## 2016-03-03 LAB — POCT GLYCOSYLATED HEMOGLOBIN (HGB A1C): HEMOGLOBIN A1C: 8.9

## 2016-03-03 LAB — COMPREHENSIVE METABOLIC PANEL
ALBUMIN: 4.2 g/dL (ref 3.5–5.2)
ALT: 13 U/L (ref 0–53)
AST: 13 U/L (ref 0–37)
Alkaline Phosphatase: 69 U/L (ref 39–117)
BILIRUBIN TOTAL: 0.6 mg/dL (ref 0.2–1.2)
BUN: 101 mg/dL (ref 6–23)
CALCIUM: 9.7 mg/dL (ref 8.4–10.5)
CHLORIDE: 91 meq/L — AB (ref 96–112)
CO2: 29 meq/L (ref 19–32)
Creatinine, Ser: 2.98 mg/dL — ABNORMAL HIGH (ref 0.40–1.50)
GFR: 22.65 mL/min — AB (ref >60.00)
Glucose, Bld: 183 mg/dL — ABNORMAL HIGH (ref 70–99)
Potassium: 4.5 mEq/L (ref 3.5–5.1)
Sodium: 132 mEq/L — ABNORMAL LOW (ref 135–145)
Total Protein: 8.3 g/dL (ref 6.0–8.3)

## 2016-03-03 LAB — TSH: TSH: 1.17 u[IU]/mL (ref 0.35–4.50)

## 2016-03-03 LAB — LIPID PANEL
CHOL/HDL RATIO: 5
CHOLESTEROL: 127 mg/dL (ref 0–200)
HDL: 28.1 mg/dL — AB (ref >39.00)
LDL Cholesterol: 65 mg/dL (ref 0–99)
NonHDL: 98.57
TRIGLYCERIDES: 170 mg/dL — AB (ref 0.0–149.0)
VLDL: 34 mg/dL (ref 0.0–40.0)

## 2016-03-03 LAB — T4, FREE: Free T4: 1.26 ng/dL (ref 0.60–1.60)

## 2016-03-03 NOTE — Telephone Encounter (Signed)
His blood does show worsening kidney function.  Recommend that he cut his Lasix dose in half and call his cardiologist as well as follow-up right away

## 2016-03-03 NOTE — Patient Instructions (Addendum)
Bring blood sugar monitor to each visit  Eliminate drinks with sugar and excessive amounts of sweets and fruits, fruit juice as well as high-fat foods and fast food  Stop LISINOPRIL for now  NEW insulin program  TOUJEO insulin: 80 units once a day at the same time.  May need to increase it or decrease by 5 units if morning sugars continue to stay consistently over 150 or go down below 100  HUMALOG U-200: Take this right before eating, 80-100 units based on how much you eating or the blood sugar level  Continue to check blood sugars before meals and at bedtime

## 2016-03-03 NOTE — Telephone Encounter (Signed)
FYI pt declined the appt with Vernona Rieger he does not want to see a dietician he has seen many in the past

## 2016-03-03 NOTE — Progress Notes (Signed)
Patient ID: Paul CardinalRobert C [name], male   DOB: 11/15/1950, 65 y.o.   MRN: [id number]            Reason for Appointment: Consultation for Type 2 Diabetes    History of Present Illness:          Date of diagnosis of type 2 diabetes mellitus: 25       Background history:   Initially his diabetes was mild and was able to control it with metformin and also Actos. He was also able to exercise and try to lose weight However about 2 years after diagnosis he was started on insulin because of poor control, details not available. His metformin was stopped possibly because of kidney problems Also not clear what his level of control was in the last few years with taking various insulin regimens including Lantus and NovoLog He thinks he was tried on Trulicity for 3-4 months and it did not help her sugars or his weight Because of needing large doses of NovoLog his previous endocrinologist switched him to U-500 Regular Insulin with a syringe   However he thinks that he had blood sugars around 7% at some point with NovoLog and Lantus  Recent history:   INSULIN regimen is:  Levemir 30 bid,  U-500 insulin before meals 20 units tid     Non-insulin hypoglycemic drugs the patient is taking are:  Current management, blood sugar patterns and problems identified:    He has been taking a combination of Levemir and U-500.  He thinks he has been taking Levemir for about a year    blood sugars have been typically poorly controlled    Although he tries to check his blood sugar several times a day he does not remember his readings and did not bring his monitor.   He thinks his blood sugars are quite variable in the morning and sometimes low     he has Probably high readings the rest of the day with some variability   He is getting help from his sister  For some of his day-to-day care   He is poorly compliant with his diet with drinking soft drinks, eating sweets and eating fast food.  Had donuts this  morning   He thinks he is taking his U-500 insulin consistently 30 minutes before meals , sometimes adjusts the dose based on blood sugar level       Side effects from medications have been: none  Compliance with the medical regimen:  poor Hypoglycemia:  occasionally in am  Glucose monitoring:  done 4+  times a day         Glucometer:  Accucheck     Blood Glucose readings by recall  Self-care: The diet that the patient has been following is: none,  drinks  Regular colas  frequently,  Eating candy, no fried  Food usually.     Meal times are: variable  Typical meal intake: Breakfast is eggs or cheese              Dietician visit, most recent:  Years ago               Exercise:  none  Weight history: 402  Wt Readings from Last 3 Encounters:  03/03/16 (!) 364 lb (165.1 kg)  12/30/15 (!) 369 lb 1.6 oz (167.4 kg)  11/20/15 (!) 399 lb (181 kg)    Glycemic control: 8.9   Lab Results  Component Value Date   HGBA1C 8.7 10/14/2015   HGBA1C  10.5 (H) 07/09/2015   HGBA1C 9.6 (H) 02/13/2014   Lab Results  Component Value Date   MICROALBUR 6.24 (H) 04/07/2009   LDLCALC UNABLE TO CALCULATE IF TRIGLYCERIDE OVER 400 mg/dL 72/03/4708   CREATININE 1.53 (H) 12/30/2015   No results found for: [facility]       Medication List       Accurate as of 03/03/16 12:53 PM. Always use your most recent med list.          ACCU-CHEK AVIVA PLUS test strip Generic drug:  glucose blood Use as instructed to test blood sugar 4 times daily before meals.   amiodarone 200 MG tablet Commonly known as:  PACERONE Take 1 tablet (200 mg total) by mouth 2 (two) times daily.   atorvastatin 80 MG tablet Commonly known as:  LIPITOR TAKE 1 TABLET BY MOUTH EVERY DAY   BD INSULIN SYRINGE ULTRAFINE 31G X 15/64" 0.3 ML Misc Generic drug:  Insulin Syringe-Needle U-100 USE TO IJNECT INSULIN 3 TIMES DAILY.   doxycycline 100 MG tablet Commonly known as:  VIBRA-TABS Take 1 tablet (100 mg total) by mouth  every 12 (twelve) hours.   gabapentin 300 MG capsule Commonly known as:  NEURONTIN Take 2 capsules (600 mg total) by mouth 2 (two) times daily.   hydrocerin Crea Apply 1 application topically daily.   insulin aspart 100 UNIT/ML injection Commonly known as:  novoLOG Inject 0-20 Units into the skin 3 (three) times daily with meals.   insulin detemir 100 UNIT/ML injection Commonly known as:  LEVEMIR Inject 0.3 mLs (30 Units total) into the skin 2 (two) times daily.   insulin regular human CONCENTRATED 500 UNIT/ML injection Commonly known as:  HUMULIN R Inject 0.08 mLs (40 Units total) into the skin 3 (three) times daily before meals.   lisinopril 10 MG tablet Commonly known as:  PRINIVIL,ZESTRIL Take 1 tablet (10 mg total) by mouth daily.   metoprolol 50 MG tablet Commonly known as:  LOPRESSOR Take 50 mg by mouth 2 (two) times daily. TWO TABLETS TWICE A DAY   multivitamin with minerals Tabs tablet Take 1 tablet by mouth daily.   mupirocin cream 2 % Commonly known as:  BACTROBAN Apply topically 2 (two) times daily.   NATURAL BALANCE TEARS OP Place 1 drop into both eyes daily as needed (for dry eyes).   Oxycodone HCl 20 MG Tabs Take 1 tablet (20 mg total) by mouth every 6 (six) hours as needed for severe pain.   polyethylene glycol packet Commonly known as:  MIRALAX / GLYCOLAX Take 17 g by mouth daily as needed for mild constipation.   potassium chloride SA 20 MEQ tablet Commonly known as:  KLOR-CON M20 Take 1 tablet (20 mEq total) by mouth daily.   XARELTO 20 MG Tabs tablet Generic drug:  rivaroxaban TAKE 1 TABLET (20 MG TOTAL) BY MOUTH DAILY WITH SUPPER.       Allergies: No Known Allergies  Past Medical History:  Diagnosis Date  . Arthritis    "hands and lower back" (09/19/2014)  . CAD (coronary artery disease)    a. s/p PCI to RCA in 2012. b. prior cath in 01/2014 with elevated L/RH pressures, mild-mod CAD of LAD/LCx with patent RCA, normal EF, c/b CIN/CHF.   [name] Carpal tunnel syndrome, bilateral   . Cellulitis   . Chronic diastolic CHF (congestive heart failure) (HCC)   . Chronic kidney disease (CKD), stage III (moderate)   . Chronic lower back pain   . History of blood  transfusion ~ 1954   "related to OR"  . Hyperlipidemia   . Hypertension   . Hypoxia    a. Qualified for home O2 at [facility] in 09/2014.  [name] Lower GI bleed   . Microcytic anemia   . Morbid obesity (HCC)   . Neuropathy (HCC)   . OSA (obstructive sleep apnea)    "I wear nasal prongs; haven't been using prongs recently" (09/19/2014)  . PAF (paroxysmal atrial fibrillation) (HCC)    TEE DCCV 09/23/2014  . Physical deconditioning   . Pilonidal cyst 1980's; 01/25/2013  . Scrotal abscess   . Type II diabetes mellitus (HCC)     Past Surgical History:  Procedure Laterality Date  . ABDOMINAL SURGERY  ~ 1954   BENIGN TUMOR REMOVED  . APPENDECTOMY    . CARDIOVERSION  2010   [name] 09/19/2014  . CARDIOVERSION N/A 09/23/2014   Procedure: CARDIOVERSION;  Surgeon: Lewayne Bunting, MD;  Location: [facility] ENDOSCOPY;  Service: Cardiovascular;  Laterality: N/A;  . CARDIOVERSION N/A 12/11/2014   Procedure: CARDIOVERSION;  Surgeon: Thurmon Fair, MD;  Location: [facility] ENDOSCOPY;  Service: Cardiovascular;  Laterality: N/A;  . CATARACT EXTRACTION W/PHACO Right 11/15/2012   Procedure: CATARACT EXTRACTION PHACO AND INTRAOCULAR LENS PLACEMENT (IOC);  Surgeon: Shade Flood, MD;  Location: [facility] OR;  Service: Ophthalmology;  Laterality: Right;  . CATARACT EXTRACTION W/PHACO Left 11/29/2012   Procedure: CATARACT EXTRACTION PHACO AND INTRAOCULAR LENS PLACEMENT (IOC);  Surgeon: Shade Flood, MD;  Location: [facility] OR;  Service: Ophthalmology;  Laterality: Left;  . CORONARY ANGIOPLASTY WITH STENT PLACEMENT  August 2012   RCA DES - [facility]  . DEBRIDEMENT  FOOT Left    debriding diabetic foot ulcers  . EYE SURGERY    . FOREIGN BODY REMOVAL Right 2014   heel,  splinter removal   . LEFT AND RIGHT  HEART CATHETERIZATION WITH CORONARY ANGIOGRAM N/A 01/31/2014   Procedure: LEFT AND RIGHT HEART CATHETERIZATION WITH CORONARY ANGIOGRAM;  Surgeon: Micheline Chapman, MD;  Location: [facility] CATH LAB;  Service: Cardiovascular;  Laterality: N/A;  . PARS PLANA VITRECTOMY Left 06/05/2013   Procedure: PARS PLANA VITRECTOMY WITH 23 GAUGE with Endolaser(constellation);  Surgeon: Shade Flood, MD;  Location: [facility] OR;  Service: Ophthalmology;  Laterality: Left;  . PILONIDAL CYST EXCISION N/A 01/08/2013   Procedure: CYST EXCISION PILONIDAL EXTENSIVE;  Surgeon: Axel Filler, MD;  Location: [facility] OR;  Service: General;  Laterality: N/A;  . PILONIDAL CYST EXCISION  1980's   "in [place]"  . TEE WITHOUT CARDIOVERSION N/A 09/23/2014   Procedure: TRANSESOPHAGEAL ECHOCARDIOGRAM (TEE);  Surgeon: Lewayne Bunting, MD;  Location: [facility] ENDOSCOPY;  Service: Cardiovascular;  Laterality: N/A;  . TONSILLECTOMY      Family History  Problem Relation Age of Onset  . Adopted: Yes  . Other Other     PT ADOPTED    Social History:  reports that he quit smoking about 32 years ago. His smoking use included Cigarettes. He has a 20.00 pack-year smoking history. He has never used smokeless tobacco. He reports that he does not drink alcohol or use drugs.   Review of Systems  Constitutional: Positive for weight loss. Negative for reduced appetite.  HENT: Negative for headaches.   Eyes: Negative for blurred vision.  Respiratory: Positive for daytime sleepiness.        He has sleep apnea but is not using his mask.  He says he is sitting up when sleeping at night  Cardiovascular: Positive for leg swelling.  No recent chest pain.  Treated for CHF by cardiologist  Gastrointestinal: Negative for nausea and abdominal pain.  Endocrine: Positive for fatigue, polydipsia and light-headedness.  Genitourinary: Positive for frequency and nocturia.  Musculoskeletal: Positive for joint pain.  Skin: Negative for rash.  Neurological: Positive for  weakness, numbness and tingling.       Has had numbness in his legs and feet for 6 years or so and they are weaker now.  Using wheelchair more to move around  Psychiatric/Behavioral: Negative for depressed mood.     Lipid history: On 80 mg Lipitor, has history of CAD    Lab Results  Component Value Date   CHOL 258 (H) 07/09/2015   HDL 32 (L) 07/09/2015   LDLCALC UNABLE TO CALCULATE IF TRIGLYCERIDE OVER 400 mg/dL 28/63/8177   LDLDIRECT 82.1 08/31/2011   TRIG 406 (H) 07/09/2015   CHOLHDL 8.1 07/09/2015           Hypertension: Present for several years, on lisinopril 10 mg daily along with metoprolol On Lasix 80 mg for history of CHF along with Aldactone  Most recent eye exam was 2015  Most recent foot exam: 8/17  Lab Results  Component Value Date   TSH 0.717 07/09/2015     LABS:  No visits with results within 1 Week(s) from this visit.  Latest known visit with results is:  Admission on 12/23/2015, Discharged on 12/30/2015  No results displayed because visit has over 200 results.      Physical Examination:  BP (!) 92/56   Pulse 91   Ht 6' (1.829 m)   Wt (!) 364 lb (165.1 kg)   SpO2 97%   BMI 49.37 kg/m   100/50  GENERAL:         Patient has generalized obesity.   HEENT:         Eye exam shows normal external appearance. Fundus exam shows no retinopathy. Oral exam shows normal mucosa .  NECK:   There is no lymphadenopathy Thyroid is not enlarged and no nodules felt.  Carotids are normal to palpation and no bruit heard LUNGS:         Chest is symmetrical. Lungs are clear to auscultation.[name]   HEART:         Heart sounds:  S1 and S2 are normal. No murmur or click heard., no S3 or S4.   ABDOMEN:   There is no distention present. Liver and spleen are not palpable. No other mass or tenderness present.   NEUROLOGICAL:   Ankle jerks are absent bilaterally.    Diabetic Foot Exam - Simple   Simple Foot Form Diabetic Foot exam was performed with the following findings:   Yes 03/03/2016 12:51 PM  Visual Inspection See comments:  Yes Sensation Testing See comments:  Yes Pulse Check See comments:  Yes Comments Onychomycosis especially first toenails present Dry scaling  of skin present.  Has black scabs on some of the toes present dorsally Absent monofilament sensation in the feet Absent pedal pulses             Vibration sense is  reduced in distal first toes. MUSCULOSKELETAL:  There is no swelling or deformity of the peripheral joints. Spine is normal to inspection.   EXTREMITIES:     There is no edema. No skin lesions present.[name] SKIN:       No rash or lesions of concern.        ASSESSMENT:  Diabetes type 2, uncontrolled    His  A1c appears to be persistently high Currently on insulin only using relatively large doses especially of the U-500 insulin He reports variable blood sugars especially fluctuating early morning Most of his hyperglycemia may be related to poor diet and to some extent insulin resistance May also be needing more rapid acting mealtime insulin especially since his U-500 is a slow acting insulin Currently not on metformin, has not taken this for several years, last GFR was 46  Complications of diabetes:Symptomatic peripheral neuropathy with sensory loss, diabetic nephropathy, unknown status of retinopathy  MORBID obesity, currently untreated History of sleep apnea  History of diabetic nephropathy with mild renal insufficiency  History of CAD, CHF and appears to have probable peripheral vascular disease  Low normal blood pressure with symptoms of dizziness  PLAN:     Consultation with dietitian  Start improving diet and eliminate simple sugars, high carbohydrate meals and fast food  Change insulin regimen to Toujeo 80 units daily along with Humalog U-200 insulin 80-100 units before meals  He will bring his monitor for download on the next visit  Discussed adjusting the Toujeo every 3-4 days based on fasting blood  sugars  Also needs to adjust his Humalog based on his meal size and pre-meal blood sugar with increasing the dose 5-10 units for high readings  He will be given a new Accu-Chek meter, apparently his insurance does not cover other brands  Recheck TSH for history of taking amiodarone  Follow-up with podiatrist for foot care  Patient Instructions  Bring blood sugar monitor to each visit  Eliminate drinks with sugar and excessive amounts of sweets and fruits, fruit juice as well as high-fat foods and fast food  Stop LISINOPRIL for now  NEW insulin program  TOUJEO insulin: 80 units once a day at the same time.  May need to increase it or decrease by 5 units if morning sugars continue to stay consistently over 150 or go down below 100  HUMALOG U-200: Take this right before eating, 80-100 units based on how much you eating or the blood sugar level  Continue to check blood sugars before meals and at bedtime   Counseling time on subjects discussed above is over 50% of today's 60 minute visit   Jamas Jaquay 03/03/2016, 12:53 PM   Note: This office note was prepared with Insurance underwriter. Any transcriptional errors that result from this process are unintentional.

## 2016-03-03 NOTE — Telephone Encounter (Signed)
[facility] in the [facility]u [facility]

## 2016-03-03 NOTE — Addendum Note (Signed)
Addended by: Adline Mango I on: 03/03/2016 02:47 PM   Modules accepted: Orders

## 2016-03-04 MED ORDER — ACCU-CHEK AVIVA PLUS VI STRP: ORAL_STRIP | 12 refills | Status: DC

## 2016-03-04 MED ORDER — INSULIN GLARGINE 300 UNIT/ML ~~LOC~~ SOPN: 80.0000 [IU] | PEN_INJECTOR | Freq: Every day | SUBCUTANEOUS | 2 refills | Status: DC

## 2016-03-04 MED ORDER — INSULIN LISPRO 200 UNIT/ML ~~LOC~~ SOPN: 1.0000 | PEN_INJECTOR | Freq: Three times a day (TID) | SUBCUTANEOUS | 2 refills | Status: DC

## 2016-03-04 MED ORDER — ACCU-CHEK AVIVA PLUS VI STRP: ORAL_STRIP | 2 refills | Status: DC

## 2016-03-04 NOTE — Telephone Encounter (Signed)
Rx submitted per pt's request.  

## 2016-03-04 NOTE — Telephone Encounter (Signed)
We can change testing frequency to 6 times a day

## 2016-03-04 NOTE — Telephone Encounter (Signed)
Patient stated that pharmacy haven't received his prescription. Please advise  [id number] [place], [place] - [address] [phone] (Phone) [phone] (Fax)

## 2016-03-04 NOTE — Telephone Encounter (Signed)
I contacted the pt and he stated he needed a refill of his Accu-chek aviva test strips with the directions of 6 times per day. I could not locate in the chart where this dosage has been approved. Please advise.

## 2016-03-05 ENCOUNTER — Ambulatory Visit (INDEPENDENT_AMBULATORY_CARE_PROVIDER_SITE_OTHER): Payer: Medicare Other | Admitting: Cardiology

## 2016-03-05 ENCOUNTER — Encounter: Payer: Self-pay | Admitting: Cardiology

## 2016-03-05 VITALS — BP 101/58 | HR 104 | Ht 72.0 in | Wt 375.0 lb

## 2016-03-05 DIAGNOSIS — I5032 Chronic diastolic (congestive) heart failure: Secondary | ICD-10-CM

## 2016-03-05 DIAGNOSIS — N289 Disorder of kidney and ureter, unspecified: Secondary | ICD-10-CM

## 2016-03-05 DIAGNOSIS — I2510 Atherosclerotic heart disease of native coronary artery without angina pectoris: Secondary | ICD-10-CM

## 2016-03-05 DIAGNOSIS — I481 Persistent atrial fibrillation: Secondary | ICD-10-CM | POA: Diagnosis not present

## 2016-03-05 DIAGNOSIS — Z9861 Coronary angioplasty status: Secondary | ICD-10-CM

## 2016-03-05 DIAGNOSIS — N189 Chronic kidney disease, unspecified: Secondary | ICD-10-CM

## 2016-03-05 DIAGNOSIS — E669 Obesity, unspecified: Secondary | ICD-10-CM

## 2016-03-05 DIAGNOSIS — R55 Syncope and collapse: Secondary | ICD-10-CM | POA: Diagnosis not present

## 2016-03-05 DIAGNOSIS — Z7901 Long term (current) use of anticoagulants: Secondary | ICD-10-CM

## 2016-03-05 DIAGNOSIS — I4819 Other persistent atrial fibrillation: Secondary | ICD-10-CM

## 2016-03-05 MED ORDER — SPIRONOLACTONE 25 MG PO TABS: 12.5000 mg | ORAL_TABLET | Freq: Every day | ORAL | 3 refills | Status: DC

## 2016-03-05 MED ORDER — FUROSEMIDE 80 MG PO TABS: 80.0000 mg | ORAL_TABLET | Freq: Every day | ORAL | 3 refills | Status: DC

## 2016-03-05 NOTE — Assessment & Plan Note (Signed)
BMI 50  

## 2016-03-05 NOTE — Assessment & Plan Note (Signed)
Xarelto

## 2016-03-05 NOTE — Assessment & Plan Note (Signed)
RCA PCI 2012, cath July 2015 OK

## 2016-03-05 NOTE — Assessment & Plan Note (Signed)
Echo May 2016- EF 50-55%

## 2016-03-05 NOTE — Progress Notes (Signed)
03/05/2016 [name]   [birth year]  [id number]  Primary Physician Paul Marland Mcalpine, MD Primary Cardiologist: Dr [name]  HPI:  Complicated 65 y/o male I'm seeing as an add on for syncope. The pt is morbidly obese with a BMI of 50. He has a history of CAD, s/p PCI to his RCA in 2012- cath in July 2015 showed non obstructive disease. He has CAF,he failed to hold NSR after a TEE CV March of 2016. Dr [name] actually stopped his Amiodarone in Dec 2016 but he remains on this. His CHADs VASc=3. He was on Coumadin but did not keep up with INR's secondary to transportation issues. His last cardiology visit was Dec 2016. He has been admitted twice this year to the hospital. He was admitted in March with sepsis and a scrotal abscess. He was admitted again in June after a fall at home. He was down 10 hrs and had acute renal injury and Rhabdomyolysis. He was discharged to SNF after his and was there several weeks. He is back home now, living with his sister.           He is in the office today with a history of syncope with micturition. Pt says he was standing urinating and collapsed. Labs done 8/23 show his SCr to be 2.98, K+ 4.5 and BUN 29. His SCr in June was 1.5. The pt complains of weakness. He is always SOB and this is unchanged.     Current Outpatient Prescriptions  Medication Sig Dispense Refill  . atorvastatin (LIPITOR) 80 MG tablet TAKE 1 TABLET BY MOUTH EVERY DAY 30 tablet 0  . furosemide (LASIX) 80 MG tablet Take 1 tablet (80 mg total) by mouth daily. 90 tablet 3  . gabapentin (NEURONTIN) 300 MG capsule Take 2 capsules (600 mg total) by mouth 2 (two) times daily. 60 capsule 0  . hydrocerin (EUCERIN) CREA Apply 1 application topically daily. 113 g 0  . Hypromellose (NATURAL BALANCE TEARS OP) Place 1 drop into both eyes daily as needed (for dry eyes).     . insulin detemir (LEVEMIR) 100 UNIT/ML injection Inject 0.3 mLs (30 Units total) into the skin 2 (two) times daily. 10 mL 0  .  Insulin Lispro (HUMALOG KWIKPEN) 200 UNIT/ML SOPN Inject 1 each into the skin 3 (three) times daily. Inject 80-100 units three times daily depending on meal size. 45 mL 2  . insulin regular human CONCENTRATED (HUMULIN R) 500 UNIT/ML injection Inject 0.08 mLs (40 Units total) into the skin 3 (three) times daily before meals. 20 mL 0  . metolazone (ZAROXOLYN) 5 MG tablet Take 1 tablet by mouth daily. Take 30 min prior to taking lasix    . metoprolol (LOPRESSOR) 50 MG tablet Take 50 mg by mouth 2 (two) times daily. TWO TABLETS TWICE A DAY    . Multiple Vitamin (MULTIVITAMIN WITH MINERALS) TABS Take 1 tablet by mouth daily.    . mupirocin cream (BACTROBAN) 2 % Apply topically 2 (two) times daily. 15 g 0  . oxyCODONE 20 MG TABS Take 1 tablet (20 mg total) by mouth every 6 (six) hours as needed for severe pain. 30 tablet 0  . spironolactone (ALDACTONE) 25 MG tablet Take 0.5 tablets (12.5 mg total) by mouth daily. 90 tablet 3  . XARELTO 20 MG TABS tablet TAKE 1 TABLET (20 MG TOTAL) BY MOUTH DAILY WITH SUPPER. 30 tablet 3  . Insulin Glargine (TOUJEO SOLOSTAR) 300 UNIT/ML SOPN Inject 80 Units into the skin daily. (  Patient not taking: Reported on 03/05/2016) 30 mL 2   No current facility-administered medications for this visit.     No Known Allergies  Social History   Social History  . Marital status: Single    Spouse name: N/A  . Number of children: Y  . Years of education: N/A   Occupational History  . disabled truck driver    Social History Main Topics  . Smoking status: Former Smoker    Packs/day: 1.00    Years: 20.00    Types: Cigarettes    Quit date: 07/13/1983  . Smokeless tobacco: Never Used  . Alcohol use No  . Drug use: No  . Sexual activity: Not Currently   Other Topics Concern  . Not on file   Social History Narrative   Pt is single.   Lives with sister.   Has children.   Was adopted.   Daily cafffiene-2 cups of coffee and 2 sodas per day           Review of  Systems: General: negative for chills, fever, night sweats or weight changes.  Cardiovascular: negative for chest pain, dyspnea on exertion, edema, orthopnea, palpitations, paroxysmal nocturnal dyspnea or shortness of breath Dermatological: negative for rash Respiratory: negative for cough or wheezing Urologic: negative for hematuria Abdominal: negative for nausea, vomiting, diarrhea, bright red blood per rectum, melena, or hematemesis Neurologic: negative for visual changes, syncope, or dizziness All other systems reviewed and are otherwise negative except as noted above.    Blood pressure (!) 101/58, pulse (!) 104, height 6' (1.829 m), weight (!) 375 lb (170.1 kg).  General appearance: alert, cooperative, no distress, morbidly obese and in wheel chair Lungs: alert, cooperative, no distress, morbidly obese and in wheel chair Heart: irregularly irregular rhythm Extremities: trace edema Neurologic: Grossly normal  EKG AF with VR 98  ASSESSMENT AND PLAN:   Syncope and collapse I believe this was hypotension mediated  Acute on chronic renal insufficiency (HCC) SCr went from  1.5 in June to 2.9.on Aug 23d  Chronic diastolic congestive heart failure [facility]) Echo May 2016- EF 50-55%  Long term (current) use of anticoagulants Xarelto  Obesity BMI 50  Atrial fibrillation Now chronic AF, Amiodarone stopped  CAD S/P percutaneous coronary angioplasty RCA PCI 2012, cath July 2015 OK   PLAN  I suggested we stop his Lisinopril and Amiodarone. We'll hold his Lasix for two doses and then cut him back to 80 mg daily. I also stopped his K+ supplement and decreased his Aldactone to 12.5 mg. He'll return to see me next week for labs and re assessment.   Corine Shelter PA-C 03/05/2016 3:24 PM

## 2016-03-05 NOTE — Assessment & Plan Note (Signed)
Now chronic AF, Amiodarone stopped

## 2016-03-05 NOTE — Assessment & Plan Note (Signed)
SCr 1.5- 2.9.

## 2016-03-05 NOTE — Assessment & Plan Note (Signed)
I believe this was hypotension mediated

## 2016-03-05 NOTE — Patient Instructions (Signed)
Medications  STOP Amiodarone, Lisinopril, and Potassium   Starting Sunday (03/07/16) Decrease Furosemide to 1 tab daily                                                 Spironolactone to 1/2 tab daily    Follow-Up  Your physician recommends that you schedule a follow-up appointment @ 1:45 on March 11, 2016 (OK per Franky Macho)   If you need a refill on your cardiac medications before your next appointment, please call your pharmacy.

## 2016-03-09 ENCOUNTER — Encounter: Payer: Self-pay | Admitting: Internal Medicine

## 2016-03-09 ENCOUNTER — Ambulatory Visit: Payer: Medicare Other | Attending: Internal Medicine | Admitting: Internal Medicine

## 2016-03-09 VITALS — BP 148/72 | HR 109 | Temp 98.2°F | Resp 16 | Wt 374.0 lb

## 2016-03-09 DIAGNOSIS — I2510 Atherosclerotic heart disease of native coronary artery without angina pectoris: Secondary | ICD-10-CM | POA: Insufficient documentation

## 2016-03-09 DIAGNOSIS — N183 Chronic kidney disease, stage 3 unspecified: Secondary | ICD-10-CM

## 2016-03-09 DIAGNOSIS — I482 Chronic atrial fibrillation: Secondary | ICD-10-CM | POA: Insufficient documentation

## 2016-03-09 DIAGNOSIS — Z1159 Encounter for screening for other viral diseases: Secondary | ICD-10-CM

## 2016-03-09 DIAGNOSIS — G4733 Obstructive sleep apnea (adult) (pediatric): Secondary | ICD-10-CM | POA: Diagnosis not present

## 2016-03-09 DIAGNOSIS — Z23 Encounter for immunization: Secondary | ICD-10-CM | POA: Diagnosis not present

## 2016-03-09 DIAGNOSIS — Z79899 Other long term (current) drug therapy: Secondary | ICD-10-CM | POA: Insufficient documentation

## 2016-03-09 DIAGNOSIS — Z125 Encounter for screening for malignant neoplasm of prostate: Secondary | ICD-10-CM

## 2016-03-09 DIAGNOSIS — Z794 Long term (current) use of insulin: Secondary | ICD-10-CM | POA: Insufficient documentation

## 2016-03-09 DIAGNOSIS — I130 Hypertensive heart and chronic kidney disease with heart failure and stage 1 through stage 4 chronic kidney disease, or unspecified chronic kidney disease: Secondary | ICD-10-CM | POA: Insufficient documentation

## 2016-03-09 DIAGNOSIS — I10 Essential (primary) hypertension: Secondary | ICD-10-CM | POA: Diagnosis not present

## 2016-03-09 DIAGNOSIS — E1122 Type 2 diabetes mellitus with diabetic chronic kidney disease: Secondary | ICD-10-CM | POA: Insufficient documentation

## 2016-03-09 DIAGNOSIS — E1165 Type 2 diabetes mellitus with hyperglycemia: Secondary | ICD-10-CM | POA: Diagnosis not present

## 2016-03-09 DIAGNOSIS — E118 Type 2 diabetes mellitus with unspecified complications: Secondary | ICD-10-CM

## 2016-03-09 DIAGNOSIS — Z6841 Body Mass Index (BMI) 40.0 and over, adult: Secondary | ICD-10-CM | POA: Insufficient documentation

## 2016-03-09 DIAGNOSIS — Z7901 Long term (current) use of anticoagulants: Secondary | ICD-10-CM | POA: Insufficient documentation

## 2016-03-09 DIAGNOSIS — I5032 Chronic diastolic (congestive) heart failure: Secondary | ICD-10-CM | POA: Insufficient documentation

## 2016-03-09 DIAGNOSIS — N179 Acute kidney failure, unspecified: Secondary | ICD-10-CM

## 2016-03-09 LAB — GLUCOSE, POCT (MANUAL RESULT ENTRY): POC GLUCOSE: 175 mg/dL — AB (ref 70–99)

## 2016-03-09 NOTE — Progress Notes (Signed)
[name], is a 65 y.o. male  QMG:[id number]  TOI:[id number]  DOB - 1950-12-30  Chief Complaint  Patient presents with  . Follow-up        Subjective:   [name] is a 65 y.o. male here today for a follow up visit for dm 2, chronic afib on Xarelto, osa (noncompliant w/ cpap),  htn and cad sp PCI to RCA in 2012.  Cath July 2015 showed non-obstructive disease. Morbid obese., bmi 50. Being followed by Endo for dm and cards for cad. Was seen by cardiology 8/25 as add on for syncope. acei and amiodarone was stopped and aldactone decreased to 12.5mg . Lasix was held for 2 doses as well.  Per pt, his endo md is trying to adjust meds to see if kidneys improve.  Currently doing ok, except for chronic back pain.  Getting PT currently at home.  Has osa, has cpap machine, but does not use b/c cannot lay flat in bed.  Patient has No headache, No chest pain, No abdominal pain - No Nausea, No new weakness tingling or numbness, No Cough - SOB.  No problems updated.  ALLERGIES: No Known Allergies  PAST MEDICAL HISTORY: Past Medical History:  Diagnosis Date  . Arthritis    "hands and lower back" (09/19/2014)  . CAD (coronary artery disease)    a. s/p PCI to RCA in 2012. b. prior cath in 01/2014 with elevated L/RH pressures, mild-mod CAD of LAD/LCx with patent RCA, normal EF, c/b CIN/CHF.  [name] Carpal tunnel syndrome, bilateral   . Cellulitis   . Chronic diastolic CHF (congestive heart failure) (HCC)   . Chronic kidney disease (CKD), stage III (moderate)   . Chronic lower back pain   . History of blood transfusion ~ 1954   "related to OR"  . Hyperlipidemia   . Hypertension   . Hypoxia    a. Qualified for home O2 at [facility] in 09/2014.  [name] Lower GI bleed   . Microcytic anemia   . Morbid obesity (HCC)   . Neuropathy (HCC)   . OSA (obstructive sleep apnea)    "I wear nasal prongs; haven't been using prongs recently" (09/19/2014)  . PAF (paroxysmal atrial fibrillation) (HCC)    TEE DCCV  09/23/2014  . Physical deconditioning   . Pilonidal cyst 1980's; 01/25/2013  . Scrotal abscess   . Type II diabetes mellitus (HCC)     MEDICATIONS AT HOME: Prior to Admission medications   Medication Sig Start Date End Date Taking? Authorizing Provider  atorvastatin (LIPITOR) 80 MG tablet TAKE 1 TABLET BY MOUTH EVERY DAY 02/09/16  Yes Lewayne Bunting, MD  furosemide (LASIX) 80 MG tablet Take 1 tablet (80 mg total) by mouth daily. 03/05/16  Yes Luke K Kilroy, PA-C  gabapentin (NEURONTIN) 300 MG capsule Take 2 capsules (600 mg total) by mouth 2 (two) times daily. 09/15/15  Yes Richarda Overlie, MD  hydrocerin (EUCERIN) CREA Apply 1 application topically daily. 12/26/15  Yes Hillary Percell Boston, MD  Hypromellose (NATURAL BALANCE TEARS OP) Place 1 drop into both eyes daily as needed (for dry eyes).    Yes Historical Provider, MD  insulin detemir (LEVEMIR) 100 UNIT/ML injection Inject 0.3 mLs (30 Units total) into the skin 2 (two) times daily. 12/30/15  Yes Hillary Percell Boston, MD  Insulin Glargine (TOUJEO SOLOSTAR) 300 UNIT/ML SOPN Inject 80 Units into the skin daily. 03/04/16  Yes Reather Littler, MD  Insulin Lispro (HUMALOG KWIKPEN) 200 UNIT/ML SOPN Inject 1 each into the skin  3 (three) times daily. Inject 80-100 units three times daily depending on meal size. 03/04/16  Yes Reather Littler, MD  insulin regular human CONCENTRATED (HUMULIN R) 500 UNIT/ML injection Inject 0.08 mLs (40 Units total) into the skin 3 (three) times daily before meals. 12/30/15  Yes Hillary Percell Boston, MD  metolazone (ZAROXOLYN) 5 MG tablet Take 1 tablet by mouth daily. Take 30 min prior to taking lasix 02/15/16  Yes Historical Provider, MD  metoprolol (LOPRESSOR) 50 MG tablet Take 50 mg by mouth 2 (two) times daily. TWO TABLETS TWICE A DAY   Yes Historical Provider, MD  Multiple Vitamin (MULTIVITAMIN WITH MINERALS) TABS Take 1 tablet by mouth daily.   Yes Historical Provider, MD  mupirocin cream (BACTROBAN) 2 % Apply topically 2  (two) times daily. 12/26/15  Yes Hillary Percell Boston, MD  oxyCODONE 20 MG TABS Take 1 tablet (20 mg total) by mouth every 6 (six) hours as needed for severe pain. 12/26/15  Yes Hillary Percell Boston, MD  spironolactone (ALDACTONE) 25 MG tablet Take 0.5 tablets (12.5 mg total) by mouth daily. 03/05/16  Yes Luke K Kilroy, PA-C  XARELTO 20 MG TABS tablet TAKE 1 TABLET (20 MG TOTAL) BY MOUTH DAILY WITH SUPPER. 09/15/15  Yes Lewayne Bunting, MD     Objective:   Vitals:   03/09/16 1541  BP: (!) 148/72  Pulse: (!) 109  Resp: 16  Temp: 98.2 F (36.8 C)  TempSrc: Oral  SpO2: 99%  Weight: (!) 374 lb (169.6 kg)    Exam General appearance : Awake, alert, not in any distress. Speech Clear. Not toxic looking, morbid obese. HEENT: Atraumatic and Normocephalic,  Neck: supple, no JVD.  Chest:Good air entry bilaterally, no added sounds. CVS: irreg irreg, tachycarcdic  Abdomen: Bowel sounds active, obese. Non tender Extremities: B/L Lower Ext shows trace bilat edema, both legs are warm to touch Neurology: Awake alert, and oriented X 3, CN II-XII grossly intact, Non focal Skin:chronic stasis changes bilat le.  Data Review Lab Results  Component Value Date   HGBA1C 8.9 03/03/2016   HGBA1C 8.7 10/14/2015   HGBA1C 10.5 (H) 07/09/2015    Depression screen PHQ 2/9 03/09/2016  Decreased Interest 0  Down, Depressed, Hopeless 0  PHQ - 2 Score 0  Some recent data might be hidden      Assessment & Plan   1. Essential hypertension, benign bp being adjusted by cards due to recent hypotension and syncope  2. Obstructive sleep apnea- unable to tolerate c-pap Recd tries cpap when sleeping sitting up, he has the nasal cpap, so may be able to tol it.  3. Uncontrolled type 2 diabetes mellitus with complication, unspecified long term insulin use status (HCC) Defer to endo, being adjusted - Glucose (CBG) - Microalbumin/Creatinine Ratio, Urine - Ambulatory referral to Ophthalmology  4. aki w/  ckd 3 per labs AceI recently stopped and lasix held x 2 days. Aldactone reduced Will follow. If progression, needs renal eval  5. Screening PSA (prostate specific antigen) - PSA, Medicare  6. Need for hepatitis C screening test - Hepatitis C antibody   7. Needs flu shot - Flu Vaccine QUAD 36+ mos PF IM (Fluarix & Fluzone Quad PF)  8. tdap today as well.     Patient have been counseled extensively about nutrition and exercise  Return in about 3 months (around 06/09/2016), or if symptoms worsen or fail to improve.  The patient was given clear instructions to go to ER or return to medical center  if symptoms don't improve, worsen or new problems develop. The patient verbalized understanding. The patient was told to call to get lab results if they haven't heard anything in the next week.   This note has been created with Education officer, environmental. Any transcriptional errors are unintentional.   [name], MD, MBA/MHA [facility] [place], [place] [phone]   03/09/2016, 4:16 PM

## 2016-03-09 NOTE — Progress Notes (Signed)
Pt is in the office today for follow up on rehab PT states his pain level today in the office is a 7

## 2016-03-09 NOTE — Patient Instructions (Addendum)
Influenza Virus Vaccine injection (Fluarix) What is this medicine? INFLUENZA VIRUS VACCINE (in floo EN zuh VAHY ruhs vak SEEN) helps to reduce the risk of getting influenza also known as the flu. This medicine may be used for other purposes; ask your health care provider or pharmacist if you have questions. What should I tell my health care provider before I take this medicine? They need to know if you have any of these conditions: -bleeding disorder like hemophilia -fever or infection -Guillain-Barre syndrome or other neurological problems -immune system problems -infection with the human immunodeficiency virus (HIV) or AIDS -low blood platelet counts -multiple sclerosis -an unusual or allergic reaction to influenza virus vaccine, eggs, chicken proteins, latex, gentamicin, other medicines, foods, dyes or preservatives -pregnant or trying to get pregnant -breast-feeding How should I use this medicine? This vaccine is for injection into a muscle. It is given by a health care professional. A copy of Vaccine Information Statements will be given before each vaccination. Read this sheet carefully each time. The sheet may change frequently. Talk to your pediatrician regarding the use of this medicine in children. Special care may be needed. Overdosage: If you think you have taken too much of this medicine contact a poison control center or emergency room at once. NOTE: This medicine is only for you. Do not share this medicine with others. What if I miss a dose? This does not apply. What may interact with this medicine? -chemotherapy or radiation therapy -medicines that lower your immune system like etanercept, anakinra, infliximab, and adalimumab -medicines that treat or prevent blood clots like warfarin -phenytoin -steroid medicines like prednisone or cortisone -theophylline -vaccines This list may not describe all possible interactions. Give your health care provider a list of all the  medicines, herbs, non-prescription drugs, or dietary supplements you use. Also tell them if you smoke, drink alcohol, or use illegal drugs. Some items may interact with your medicine. What should I watch for while using this medicine? Report any side effects that do not go away within 3 days to your doctor or health care professional. Call your health care provider if any unusual symptoms occur within 6 weeks of receiving this vaccine. You may still catch the flu, but the illness is not usually as bad. You cannot get the flu from the vaccine. The vaccine will not protect against colds or other illnesses that may cause fever. The vaccine is needed every year. What side effects may I notice from receiving this medicine? Side effects that you should report to your doctor or health care professional as soon as possible: -allergic reactions like skin rash, itching or hives, swelling of the face, lips, or tongue Side effects that usually do not require medical attention (report to your doctor or health care professional if they continue or are bothersome): -fever -headache -muscle aches and pains -pain, tenderness, redness, or swelling at site where injected -weak or tired This list may not describe all possible side effects. Call your doctor for medical advice about side effects. You may report side effects to FDA at 1-800-FDA-1088. Where should I keep my medicine? This vaccine is only given in a clinic, pharmacy, doctor's office, or other health care setting and will not be stored at home. NOTE: This sheet is a summary. It may not cover all possible information. If you have questions about this medicine, talk to your doctor, pharmacist, or health care provider.    2016, Elsevier/Gold Standard. (2008-01-24 09:30:40)   -  Sleep Apnea Sleep apnea  is disorder that affects a person's sleep. A person with sleep apnea has abnormal pauses in their breathing when they sleep. It is hard for them to get a  good sleep. This makes a person tired during the day. It also can lead to other physical problems. There are three types of sleep apnea. One type is when breathing stops for a short time because your airway is blocked (obstructive sleep apnea). Another type is when the brain sometimes fails to give the normal signal to breathe to the muscles that control your breathing (central sleep apnea). The third type is a combination of the other two types. HOME CARE  Do not sleep on your back. Try to sleep on your side.  Take all medicine as told by your doctor.  Avoid alcohol, calming medicines (sedatives), and depressant drugs.  Try to lose weight if you are overweight. Talk to your doctor about a healthy weight goal. Your doctor may have you use a device that helps to open your airway. It can help you get the air that you need. It is called a positive airway pressure (PAP) device. There are three types of PAP devices:  Continuous positive airway pressure (CPAP) device.  Nasal expiratory positive airway pressure (EPAP) device.  Bilevel positive airway pressure (BPAP) device. MAKE SURE YOU:  Understand these instructions.  Will watch your condition.  Will get help right away if you are not doing well or get worse.   This information is not intended to replace advice given to you by your health care provider. Make sure you discuss any questions you have with your health care provider.   Document Released: 04/06/2008 Document Revised: 07/19/2014 Document Reviewed: 10/30/2011 Elsevier Interactive Patient Education 2016 [employer].   - Diabetes Mellitus and Food It is important for you to manage your blood sugar (glucose) level. Your blood glucose level can be greatly affected by what you eat. Eating healthier foods in the appropriate amounts throughout the day at about the same time each day will help you control your blood glucose level. It can also help slow or prevent worsening of your  diabetes mellitus. Healthy eating may even help you improve the level of your blood pressure and reach or maintain a healthy weight.  General recommendations for healthful eating and cooking habits include:  Eating meals and snacks regularly. Avoid going long periods of time without eating to lose weight.  Eating a diet that consists mainly of plant-based foods, such as fruits, vegetables, nuts, legumes, and whole grains.  Using low-heat cooking methods, such as baking, instead of high-heat cooking methods, such as deep frying. Work with your dietitian to make sure you understand how to use the Nutrition Facts information on food labels. HOW CAN FOOD AFFECT ME? Carbohydrates Carbohydrates affect your blood glucose level more than any other type of food. Your dietitian will help you determine how many carbohydrates to eat at each meal and teach you how to count carbohydrates. Counting carbohydrates is important to keep your blood glucose at a healthy level, especially if you are using insulin or taking certain medicines for diabetes mellitus. Alcohol Alcohol can cause sudden decreases in blood glucose (hypoglycemia), especially if you use insulin or take certain medicines for diabetes mellitus. Hypoglycemia can be a life-threatening condition. Symptoms of hypoglycemia (sleepiness, dizziness, and disorientation) are similar to symptoms of having too much alcohol.  If your health care provider has given you approval to drink alcohol, do so in moderation and use the following  guidelines:  Women should not have more than one drink per day, and men should not have more than two drinks per day. One drink is equal to:  12 oz of beer.  5 oz of wine.  1 oz of hard liquor.  Do not drink on an empty stomach.  Keep yourself hydrated. Have water, diet soda, or unsweetened iced tea.  Regular soda, juice, and other mixers might contain a lot of carbohydrates and should be counted. WHAT FOODS ARE NOT  RECOMMENDED? As you make food choices, it is important to remember that all foods are not the same. Some foods have fewer nutrients per serving than other foods, even though they might have the same number of calories or carbohydrates. It is difficult to get your body what it needs when you eat foods with fewer nutrients. Examples of foods that you should avoid that are high in calories and carbohydrates but low in nutrients include:  Trans fats (most processed foods list trans fats on the Nutrition Facts label).  Regular soda.  Juice.  Candy.  Sweets, such as cake, pie, doughnuts, and cookies.  Fried foods. WHAT FOODS CAN I EAT? Eat nutrient-rich foods, which will nourish your body and keep you healthy. The food you should eat also will depend on several factors, including:  The calories you need.  The medicines you take.  Your weight.  Your blood glucose level.  Your blood pressure level.  Your cholesterol level. You should eat a variety of foods, including:  Protein.  Lean cuts of meat.  Proteins low in saturated fats, such as fish, egg whites, and beans. Avoid processed meats.  Fruits and vegetables.  Fruits and vegetables that may help control blood glucose levels, such as apples, mangoes, and yams.  Dairy products.  Choose fat-free or low-fat dairy products, such as milk, yogurt, and cheese.  Grains, bread, pasta, and rice.  Choose whole grain products, such as multigrain bread, whole oats, and brown rice. These foods may help control blood pressure.  Fats.  Foods containing healthful fats, such as nuts, avocado, olive oil, canola oil, and fish. DOES EVERYONE WITH DIABETES MELLITUS HAVE THE SAME MEAL PLAN? Because every person with diabetes mellitus is different, there is not one meal plan that works for everyone. It is very important that you meet with a dietitian who will help you create a meal plan that is just right for you.   This information is not  intended to replace advice given to you by your health care provider. Make sure you discuss any questions you have with your health care provider.   Document Released: 03/25/2005 Document Revised: 07/19/2014 Document Reviewed: 05/25/2013 Elsevier Interactive Patient Education 2016 [employer].  - Tips for Eating Away From Home If You Have Diabetes Controlling your level of blood glucose, also known as blood sugar, can be challenging. It can be even more difficult when you do not prepare your own meals. The following tips can help you manage your diabetes when you eat away from home. PLANNING AHEAD Plan ahead if you know you will be eating away from home:  Ask your health care provider how to time meals and medicine if you are taking insulin.  Make a list of restaurants near you that offer healthy choices. If they have a carry-out menu, take it home and plan what you will order ahead of time.  Look up the restaurant you want to eat at online. Many chain and fast-food restaurants list nutritional information  online. Use this information to choose the healthiest options and to calculate how many carbohydrates will be in your meal.  Use a carbohydrate-counting book or mobile app to look up the carbohydrate content and serving size of the foods you want to eat.  Become familiar with serving sizes and learn to recognize how many servings are in a portion. This will allow you to estimate how many carbohydrates you can eat. FREE FOODS A "free food" is any food or drink that has less than 5 g of carbohydrates per serving. Free foods include:  Many vegetables.  Hard boiled eggs.  Nuts or seeds.  Olives.  Cheeses.  Meats. These types of foods make good appetizer choices and are often available at salad bars. Lemon juice, vinegar, or a low-calorie salad dressing of fewer than 20 calories per serving can be used as a "free" salad dressing.  CHOICES TO REDUCE CARBOHYDRATES  Substitute nonfat  sweetened yogurt with a sugar-free yogurt. Yogurt made from soy milk may also be used, but you will still want a sugar-free or plain option to choose a lower carbohydrate amount.  Ask your server to take away the bread basket or chips from your table.  Order fresh fruit. A salad bar often offers fresh fruit choices. Avoid canned fruit because it is usually packed in sugar or syrup.  Order a salad, and eat it without dressing. Or, create a "free" salad dressing.  Ask for substitutions. For example, instead of [place] fries, request an order of a vegetable such as salad, green beans, or broccoli. OTHER TIPS   If you take insulin, take the insulin once your food arrives to your table. This will ensure your insulin and food are timed correctly.  Ask your server about the portion size before your order, and ask for a take-out box if the portion has more servings than you should have. When your food comes, leave the amount you should have on the plate, and put the rest in the take-out box.  Consider splitting an entree with someone and ordering a side salad.   This information is not intended to replace advice given to you by your health care provider. Make sure you discuss any questions you have with your health care provider.   Document Released: 06/28/2005 Document Revised: 03/19/2015 Document Reviewed: 09/25/2013 Elsevier Interactive Patient Education 2016 [employer].  Tdap Vaccine (Tetanus, Diphtheria and Pertussis): What You Need to Know 1. Why get vaccinated? Tetanus, diphtheria and pertussis are very serious diseases. Tdap vaccine can protect [place] from these diseases. And, Tdap vaccine given to pregnant women can protect newborn babies against pertussis. TETANUS (Lockjaw) is rare in the [place] today. It causes painful muscle tightening and stiffness, usually all over the body.  It can lead to tightening of muscles in the head and neck so you can't open your mouth, swallow, or  sometimes even breathe. Tetanus kills about 1 out of 10 people who are infected even after receiving the best medical care. DIPHTHERIA is also rare in the [place] today. It can cause a thick coating to form in the back of the throat.  It can lead to breathing problems, heart failure, paralysis, and death. PERTUSSIS (Whooping Cough) causes severe coughing spells, which can cause difficulty breathing, vomiting and disturbed sleep.  It can also lead to weight loss, incontinence, and rib fractures. Up to 2 in 100 adolescents and 5 in 100 adults with pertussis are hospitalized or have complications, which could include pneumonia or  death. These diseases are caused by bacteria. Diphtheria and pertussis are spread from person to person through secretions from coughing or sneezing. Tetanus enters the body through cuts, scratches, or wounds. Before vaccines, as many as 200,000 cases of diphtheria, 200,000 cases of pertussis, and hundreds of cases of tetanus, were reported in the [place] each year. Since vaccination began, reports of cases for tetanus and diphtheria have dropped by about 99% and for pertussis by about 80%. 2. Tdap vaccine Tdap vaccine can protect adolescents and adults from tetanus, diphtheria, and pertussis. One dose of Tdap is routinely given at age 62 or 17. People who did not get Tdap at that age should get it as soon as possible. Tdap is especially important for healthcare professionals and anyone having close contact with a baby younger than 12 months. Pregnant women should get a dose of Tdap during every pregnancy, to protect the newborn from pertussis. Infants are most at risk for severe, life-threatening complications from pertussis. Another vaccine, called Td, protects against tetanus and diphtheria, but not pertussis. A Td booster should be given every 10 years. Tdap may be given as one of these boosters if you have never gotten Tdap before. Tdap may also be given after a  severe cut or burn to prevent tetanus infection. Your doctor or the person giving you the vaccine can give you more information. Tdap may safely be given at the same time as other vaccines. 3. Some people should not get this vaccine  A person who has ever had a life-threatening allergic reaction after a previous dose of any diphtheria, tetanus or pertussis containing vaccine, OR has a severe allergy to any part of this vaccine, should not get Tdap vaccine. Tell the person giving the vaccine about any severe allergies.  Anyone who had coma or long repeated seizures within 7 days after a childhood dose of DTP or DTaP, or a previous dose of Tdap, should not get Tdap, unless a cause other than the vaccine was found. They can still get Td.  Talk to your doctor if you:  have seizures or another nervous system problem,  had severe pain or swelling after any vaccine containing diphtheria, tetanus or pertussis,  ever had a condition called Guillain-Barr Syndrome (GBS),  aren't feeling well on the day the shot is scheduled. 4. Risks With any medicine, including vaccines, there is a chance of side effects. These are usually mild and go away on their own. Serious reactions are also possible but are rare. Most people who get Tdap vaccine do not have any problems with it. Mild problems following Tdap (Did not interfere with activities)  Pain where the shot was given (about 3 in 4 adolescents or 2 in 3 adults)  Redness or swelling where the shot was given (about 1 person in 5)  Mild fever of at least 100.97F (up to about 1 in 25 adolescents or 1 in 100 adults)  Headache (about 3 or 4 people in 10)  Tiredness (about 1 person in 3 or 4)  Nausea, vomiting, diarrhea, stomach ache (up to 1 in 4 adolescents or 1 in 10 adults)  Chills, sore joints (about 1 person in 10)  Body aches (about 1 person in 3 or 4)  Rash, swollen glands (uncommon) Moderate problems following Tdap (Interfered with  activities, but did not require medical attention)  Pain where the shot was given (up to 1 in 5 or 6)  Redness or swelling where the shot was given (up to  about 1 in 16 adolescents or 1 in 12 adults)  Fever over 102F (about 1 in 100 adolescents or 1 in 250 adults)  Headache (about 1 in 7 adolescents or 1 in 10 adults)  Nausea, vomiting, diarrhea, stomach ache (up to 1 or 3 people in 100)  Swelling of the entire arm where the shot was given (up to about 1 in 500). Severe problems following Tdap (Unable to perform usual activities; required medical attention)  Swelling, severe pain, bleeding and redness in the arm where the shot was given (rare). Problems that could happen after any vaccine:  People sometimes faint after a medical procedure, including vaccination. Sitting or lying down for about 15 minutes can help prevent fainting, and injuries caused by a fall. Tell your doctor if you feel dizzy, or have vision changes or ringing in the ears.  Some people get severe pain in the shoulder and have difficulty moving the arm where a shot was given. This happens very rarely.  Any medication can cause a severe allergic reaction. Such reactions from a vaccine are very rare, estimated at fewer than 1 in a million doses, and would happen within a few minutes to a few hours after the vaccination. As with any medicine, there is a very remote chance of a vaccine causing a serious injury or death. The safety of vaccines is always being monitored. For more information, visit: http://floyd.org/ 5. What if there is a serious problem? What should I look for?  Look for anything that concerns you, such as signs of a severe allergic reaction, very high fever, or unusual behavior.  Signs of a severe allergic reaction can include hives, swelling of the face and throat, difficulty breathing, a fast heartbeat, dizziness, and weakness. These would usually start a few minutes to a few hours after the  vaccination. What should I do?  If you think it is a severe allergic reaction or other emergency that can't wait, call 9-1-1 or get the person to the nearest hospital. Otherwise, call your doctor.  Afterward, the reaction should be reported to the Vaccine Adverse Event Reporting System (VAERS). Your doctor might file this report, or you can do it yourself through the VAERS web site at www.vaers.LAgents.no, or by calling 1-217-242-1075. VAERS does not give medical advice.  6. The National Vaccine Injury Compensation Program The [employer] Vaccine Injury Compensation Program (VICP) is a federal program that was created to compensate people who may have been injured by certain vaccines. Persons who believe they may have been injured by a vaccine can learn about the program and about filing a claim by calling 1-514 383 0383 or visiting the VICP website at SpiritualWord.at. There is a time limit to file a claim for compensation. 7. How can I learn more?  Ask your doctor. He or she can give you the vaccine package insert or suggest other sources of information.  Call your local or state health department.  Contact the Centers for Disease Control and Prevention (CDC):  Call 970-358-0547 (1-800-CDC-INFO) or  Visit CDC's website at PicCapture.uy CDC Tdap Vaccine VIS (09/04/13)   This information is not intended to replace advice given to you by your health care provider. Make sure you discuss any questions you have with your health care provider.   Document Released: 12/28/2011 Document Revised: 07/19/2014 Document Reviewed: 10/10/2013 Elsevier Interactive Patient Education [employer]

## 2016-03-10 ENCOUNTER — Encounter: Payer: Self-pay | Admitting: Internal Medicine

## 2016-03-10 LAB — MICROALBUMIN / CREATININE URINE RATIO
CREATININE, URINE: 39 mg/dL (ref 20–370)
MICROALB UR: 0.5 mg/dL
MICROALB/CREAT RATIO: 13 ug/mg{creat} (ref <30)

## 2016-03-10 LAB — PSA: PSA: 0.3 ng/mL (ref <=4.0)

## 2016-03-10 LAB — HEPATITIS C ANTIBODY: HCV AB: NEGATIVE

## 2016-03-11 ENCOUNTER — Other Ambulatory Visit: Payer: Self-pay | Admitting: *Deleted

## 2016-03-11 ENCOUNTER — Ambulatory Visit (INDEPENDENT_AMBULATORY_CARE_PROVIDER_SITE_OTHER): Payer: Medicare Other | Admitting: Cardiology

## 2016-03-11 ENCOUNTER — Encounter: Payer: Self-pay | Admitting: Cardiology

## 2016-03-11 VITALS — BP 136/64 | HR 93 | Ht 72.0 in | Wt 372.8 lb

## 2016-03-11 DIAGNOSIS — E118 Type 2 diabetes mellitus with unspecified complications: Secondary | ICD-10-CM

## 2016-03-11 DIAGNOSIS — Z9861 Coronary angioplasty status: Secondary | ICD-10-CM

## 2016-03-11 DIAGNOSIS — I10 Essential (primary) hypertension: Secondary | ICD-10-CM

## 2016-03-11 DIAGNOSIS — G4733 Obstructive sleep apnea (adult) (pediatric): Secondary | ICD-10-CM

## 2016-03-11 DIAGNOSIS — R55 Syncope and collapse: Secondary | ICD-10-CM

## 2016-03-11 DIAGNOSIS — N289 Disorder of kidney and ureter, unspecified: Secondary | ICD-10-CM

## 2016-03-11 DIAGNOSIS — I482 Chronic atrial fibrillation, unspecified: Secondary | ICD-10-CM

## 2016-03-11 DIAGNOSIS — I5032 Chronic diastolic (congestive) heart failure: Secondary | ICD-10-CM

## 2016-03-11 DIAGNOSIS — I4891 Unspecified atrial fibrillation: Secondary | ICD-10-CM

## 2016-03-11 DIAGNOSIS — IMO0002 Reserved for concepts with insufficient information to code with codable children: Secondary | ICD-10-CM

## 2016-03-11 DIAGNOSIS — Z7901 Long term (current) use of anticoagulants: Secondary | ICD-10-CM

## 2016-03-11 DIAGNOSIS — E1165 Type 2 diabetes mellitus with hyperglycemia: Secondary | ICD-10-CM

## 2016-03-11 DIAGNOSIS — N189 Chronic kidney disease, unspecified: Secondary | ICD-10-CM

## 2016-03-11 DIAGNOSIS — I2510 Atherosclerotic heart disease of native coronary artery without angina pectoris: Secondary | ICD-10-CM

## 2016-03-11 LAB — CBC
HCT: 33.1 % — ABNORMAL LOW (ref 38.5–50.0)
Hemoglobin: 10.9 g/dL — ABNORMAL LOW (ref 13.2–17.1)
MCH: 25 pg — ABNORMAL LOW (ref 27.0–33.0)
MCHC: 32.9 g/dL (ref 32.0–36.0)
MCV: 75.9 fL — ABNORMAL LOW (ref 80.0–100.0)
MPV: 9.1 fL (ref 7.5–12.5)
Platelets: 291 10*3/uL (ref 140–400)
RBC: 4.36 MIL/uL (ref 4.20–5.80)
RDW: 16.7 % — ABNORMAL HIGH (ref 11.0–15.0)
WBC: 12.6 10*3/uL — ABNORMAL HIGH (ref 3.8–10.8)

## 2016-03-11 LAB — BASIC METABOLIC PANEL WITH GFR
BUN: 37 mg/dL — ABNORMAL HIGH (ref 7–25)
CO2: 26 mmol/L (ref 20–31)
Calcium: 9.1 mg/dL (ref 8.6–10.3)
Chloride: 99 mmol/L (ref 98–110)
Creat: 1.77 mg/dL — ABNORMAL HIGH (ref 0.70–1.25)
GFR, Est African American: 46 mL/min — ABNORMAL LOW (ref >=60)
GFR, Est Non African American: 40 mL/min — ABNORMAL LOW (ref >=60)
Glucose, Bld: 153 mg/dL — ABNORMAL HIGH (ref 65–99)
Potassium: 3.7 mmol/L (ref 3.5–5.3)
Sodium: 135 mmol/L (ref 135–146)

## 2016-03-11 MED ORDER — METOLAZONE 5 MG PO TABS: 5.0000 mg | ORAL_TABLET | ORAL | 3 refills | Status: DC | PRN

## 2016-03-11 MED ORDER — FUROSEMIDE 80 MG PO TABS: 80.0000 mg | ORAL_TABLET | Freq: Two times a day (BID) | ORAL | 3 refills | Status: DC

## 2016-03-11 MED ORDER — FUROSEMIDE 80 MG PO TABS: 80.0000 mg | ORAL_TABLET | Freq: Every day | ORAL | 3 refills | Status: DC

## 2016-03-11 MED ORDER — RIVAROXABAN 20 MG PO TABS: 20.0000 mg | ORAL_TABLET | Freq: Every day | ORAL | 3 refills | Status: DC

## 2016-03-11 NOTE — Patient Instructions (Addendum)
Medication Changes  Stop daily dose of Metolazone. If weight goes over 375lbs, take 1 tablet 30 minutes before taking Furosemide. Continue to do this until your weight comes down.  Take Furosemide 80mg  twice daily.   Labwork  Your physician recommends that you return for lab work today: BMP, BNP, CBC    Follow-Up  Your physician recommends that you schedule a follow-up appointment in: 4 weeks with Dr. .

## 2016-03-11 NOTE — Progress Notes (Signed)
03/11/2016 [name]   Jan 15, 1951  [id number]  Primary Physician Dawn Marland Mcalpine, MD Primary Cardiologist: Dr [name]  HPI:  65 y/o morbidly obese (BMI 50.6) male with a history of CAD, CAF, diastolic CHF, and CRI-3. He's s/p PCI to his RCA in 2012- cath in July 2015 showed non obstructive disease. He has CAF,he failed to hold NSR after a TEE CV March of 2016. Dr Antoine Poche actually stopped his Amiodarone in Dec 2016 but he was still on this when I saw him 03/05/16. His CHADs VASc=3. He was on Coumadin but did not keep up with INR's secondary to transportation issues and is now on Xarelto. He has been admitted twice this year to the hospital. He was admitted in March with sepsis and a scrotal abscess. He was admitted again in June after a fall at home. He was down 10 hrs and had acute renal injury and Rhabdomyolysis. He was discharged to SNF after his and was there several weeks. He is back home now, living with his sister. I saw him in the office 03/05/16 with a history of syncope with micturition. Pt says he was standing urinating and collapsed. Labs done 8/23 showed his SCr to be 2.98, K+ 4.5 and BUN 101 . His SCr in June was 1.5, BUN 25. The pt complains of weakness. He is always SOB and this is unchanged. I decreased his Aldactone to 12.5 mg and cut his Lasix back to 80 daily after holding it for two dose. He is in the office today for follow up. He says he has run out of Lasix (??) and hasn't had any in 3 days. He is still taking Metolazone 5 mg daily (discharged from the hospital on this).  He seems stable. He has dyspnea with any exertion but this is chronic. He has not had any further orthostatic dizziness or syncope.    Current Outpatient Prescriptions  Medication Sig Dispense Refill  . atorvastatin (LIPITOR) 80 MG tablet TAKE 1 TABLET BY MOUTH EVERY DAY 30 tablet 0  . gabapentin (NEURONTIN) 300 MG capsule Take 2 capsules (600 mg total) by mouth 2 (two) times daily. 60 capsule 0  .  hydrocerin (EUCERIN) CREA Apply 1 application topically daily. 113 g 0  . Hypromellose (NATURAL BALANCE TEARS OP) Place 1 drop into both eyes daily as needed (for dry eyes).     . insulin detemir (LEVEMIR) 100 UNIT/ML injection Inject 0.3 mLs (30 Units total) into the skin 2 (two) times daily. 10 mL 0  . Insulin Glargine (TOUJEO SOLOSTAR) 300 UNIT/ML SOPN Inject 80 Units into the skin daily. 30 mL 2  . Insulin Lispro (HUMALOG KWIKPEN) 200 UNIT/ML SOPN Inject 1 each into the skin 3 (three) times daily. Inject 80-100 units three times daily depending on meal size. 45 mL 2  . insulin regular human CONCENTRATED (HUMULIN R) 500 UNIT/ML injection Inject 0.08 mLs (40 Units total) into the skin 3 (three) times daily before meals. 20 mL 0  . metolazone (ZAROXOLYN) 5 MG tablet Take 1 tablet by mouth daily. Take 30 min prior to taking lasix    . metoprolol (LOPRESSOR) 50 MG tablet Take 50 mg by mouth 2 (two) times daily. TWO TABLETS TWICE A DAY    . Multiple Vitamin (MULTIVITAMIN WITH MINERALS) TABS Take 1 tablet by mouth daily.    . mupirocin cream (BACTROBAN) 2 % Apply topically 2 (two) times daily. 15 g 0  . oxyCODONE 20 MG TABS Take 1 tablet (20 mg  total) by mouth every 6 (six) hours as needed for severe pain. 30 tablet 0  . spironolactone (ALDACTONE) 25 MG tablet Take 0.5 tablets (12.5 mg total) by mouth daily. 90 tablet 3  . XARELTO 20 MG TABS tablet TAKE 1 TABLET (20 MG TOTAL) BY MOUTH DAILY WITH SUPPER. 30 tablet 3   No current facility-administered medications for this visit.     No Known Allergies  Social History   Social History  . Marital status: Single    Spouse name: N/A  . Number of children: Y  . Years of education: N/A   Occupational History  . disabled truck driver    Social History Main Topics  . Smoking status: Former Smoker    Packs/day: 1.00    Years: 20.00    Types: Cigarettes    Quit date: 07/13/1983  . Smokeless tobacco: Never Used  . Alcohol use No  . Drug use: No    . Sexual activity: Not Currently   Other Topics Concern  . Not on file   Social History Narrative   Pt is single.   Lives with sister.   Has children.   Was adopted.   Daily cafffiene-2 cups of coffee and 2 sodas per day           Review of Systems: General: negative for chills, fever, night sweats or weight changes.  Cardiovascular: negative for chest pain, dyspnea on exertion, edema, orthopnea, palpitations, paroxysmal nocturnal dyspnea or shortness of breath Dermatological: negative for rash Respiratory: negative for cough or wheezing Urologic: negative for hematuria Abdominal: negative for nausea, vomiting, diarrhea, bright red blood per rectum, melena, or hematemesis Neurologic: negative for visual changes, syncope, or dizziness All other systems reviewed and are otherwise negative except as noted above.    Blood pressure 136/64, pulse 93, height 6' (1.829 m), weight (!) 372 lb 12.8 oz (169.1 kg).  General appearance: alert, cooperative and morbidly obese Lungs: clear to auscultation bilaterally and decreased overall Heart: irregularly irregular rhythm Extremities: 1-2+ chronic LE edema Skin: pale cool dry Neurologic: Grossly normal   ASSESSMENT AND PLAN:   Syncope and collapse I believe this was hypotension mediated, diuretics backed off  Acute on chronic renal insufficiency (HCC) SCr went from  1.5 in June to 2.9.on Aug 23d  Chronic diastolic congestive heart failure [facility]) Echo May 2016- EF 50-55%  Long term (current) use of anticoagulants Xarelto  Obesity BMI 50  Atrial fibrillation Now chronic AF, Amiodarone stopped  CAD S/P percutaneous coronary angioplasty RCA PCI 2012, cath July 2015 OK   PLAN  I will get labs today- CBC, BMP, BNP. His Xarelto may need to be decreased if his renal function remains elevated. I suggested he resume Lasix 80 mg BID and take Metolazone 5 mg prn if his wgt is > 375 lbs. Further recommendations pending labs. He  may need referral to CHF clinic and possibly renal. Morbid obesity major contributor to his multiple medical issues.   Corine Shelter PA-C 03/11/2016 2:23 PM

## 2016-03-12 LAB — BRAIN NATRIURETIC PEPTIDE: Brain Natriuretic Peptide: 130 pg/mL — ABNORMAL HIGH (ref <100)

## 2016-03-24 ENCOUNTER — Telehealth: Payer: Self-pay | Admitting: Internal Medicine

## 2016-03-24 ENCOUNTER — Ambulatory Visit: Payer: Medicare Other | Admitting: Nutrition

## 2016-03-24 ENCOUNTER — Ambulatory Visit: Payer: Medicare Other | Admitting: Endocrinology

## 2016-03-24 ENCOUNTER — Ambulatory Visit: Payer: Medicare Other | Admitting: Internal Medicine

## 2016-03-29 ENCOUNTER — Ambulatory Visit: Payer: Medicare Other | Admitting: Endocrinology

## 2016-04-03 NOTE — Progress Notes (Deleted)
HPI: FU atrial fibrillation and CAD. Patient had PCI of his right coronary artery in August of 2012 in [place]. He had a drug-eluting stent. ABIs in April of 2013 were unremarkable. Nuclear study 6/15 showed EF 61 with partially reversible apical lateral defect. Cath 7/15 showed 40-50 LAD, 50 Lcx and EF 65; PCWP 20 and PA 53/23. Had contrast nephropathy following procedure. TEE DCCV 3/16. Echo 5/16 showed EF 50-55, moderate LVH, mild LAE. However afib recurred at fu ov and now treated with rate control and anticoagulation. Since last seen,   Current Outpatient Prescriptions  Medication Sig Dispense Refill  . atorvastatin (LIPITOR) 80 MG tablet TAKE 1 TABLET BY MOUTH EVERY DAY 30 tablet 0  . furosemide (LASIX) 80 MG tablet Take 1 tablet (80 mg total) by mouth 2 (two) times daily. 180 tablet 3  . gabapentin (NEURONTIN) 300 MG capsule Take 2 capsules (600 mg total) by mouth 2 (two) times daily. 60 capsule 0  . hydrocerin (EUCERIN) CREA Apply 1 application topically daily. 113 g 0  . Hypromellose (NATURAL BALANCE TEARS OP) Place 1 drop into both eyes daily as needed (for dry eyes).     . insulin detemir (LEVEMIR) 100 UNIT/ML injection Inject 0.3 mLs (30 Units total) into the skin 2 (two) times daily. 10 mL 0  . Insulin Glargine (TOUJEO SOLOSTAR) 300 UNIT/ML SOPN Inject 80 Units into the skin daily. 30 mL 2  . Insulin Lispro (HUMALOG KWIKPEN) 200 UNIT/ML SOPN Inject 1 each into the skin 3 (three) times daily. Inject 80-100 units three times daily depending on meal size. 45 mL 2  . insulin regular human CONCENTRATED (HUMULIN R) 500 UNIT/ML injection Inject 0.08 mLs (40 Units total) into the skin 3 (three) times daily before meals. 20 mL 0  . metolazone (ZAROXOLYN) 5 MG tablet Take 1 tablet (5 mg total) by mouth as needed (Take 30 minutes before Furosemide if weight goes over 375lbs.). 90 tablet 3  . metoprolol (LOPRESSOR) 50 MG tablet Take 50 mg by mouth 2 (two) times daily. TWO TABLETS  TWICE A DAY    . Multiple Vitamin (MULTIVITAMIN WITH MINERALS) TABS Take 1 tablet by mouth daily.    . mupirocin cream (BACTROBAN) 2 % Apply topically 2 (two) times daily. 15 g 0  . oxyCODONE 20 MG TABS Take 1 tablet (20 mg total) by mouth every 6 (six) hours as needed for severe pain. 30 tablet 0  . rivaroxaban (XARELTO) 20 MG TABS tablet Take 1 tablet (20 mg total) by mouth daily with supper. 30 tablet 3  . spironolactone (ALDACTONE) 25 MG tablet Take 0.5 tablets (12.5 mg total) by mouth daily. 90 tablet 3   No current facility-administered medications for this visit.      Past Medical History:  Diagnosis Date  . Arthritis    "hands and lower back" (09/19/2014)  . CAD (coronary artery disease)    a. s/p PCI to RCA in 2012. b. prior cath in 01/2014 with elevated L/RH pressures, mild-mod CAD of LAD/LCx with patent RCA, normal EF, c/b CIN/CHF.  [name] Carpal tunnel syndrome, bilateral   . Cellulitis   . Chronic diastolic CHF (congestive heart failure) (HCC)   . Chronic kidney disease (CKD), stage III (moderate)   . Chronic lower back pain   . History of blood transfusion ~ 1954   "related to OR"  . Hyperlipidemia   . Hypertension   . Hypoxia    a. Qualified for home O2 at [place]  in 09/2014.  [name] Lower GI bleed   . Microcytic anemia   . Morbid obesity (HCC)   . Neuropathy (HCC)   . OSA (obstructive sleep apnea)    "I wear nasal prongs; haven't been using prongs recently" (09/19/2014)  . PAF (paroxysmal atrial fibrillation) (HCC)    TEE DCCV 09/23/2014  . Physical deconditioning   . Pilonidal cyst 1980's; 01/25/2013  . Scrotal abscess   . Type II diabetes mellitus (HCC)     Past Surgical History:  Procedure Laterality Date  . ABDOMINAL SURGERY  ~ 1954   BENIGN TUMOR REMOVED  . APPENDECTOMY    . CARDIOVERSION  2010   [name] 09/19/2014  . CARDIOVERSION N/A 09/23/2014   Procedure: CARDIOVERSION;  Surgeon: Lewayne Bunting, MD;  Location[facility] ENDOSCOPY;  Service: Cardiovascular;  Laterality:  N/A;  . CARDIOVERSION N/A 12/11/2014   Procedure: CARDIOVERSION;  Surgeon: Thurmon Fair, MD;  Location: [facility] ENDOSCOPY;  Service: Cardiovascular;  Laterality: N/A;  . CATARACT EXTRACTION W/PHACO Right 11/15/2012   Procedure: CATARACT EXTRACTION PHACO AND INTRAOCULAR LENS PLACEMENT (IOC);  Surgeon: Shade Flood, MD;  Location: [facility] OR;  Service: Ophthalmology;  Laterality: Right;  . CATARACT EXTRACTION W/PHACO Left 11/29/2012   Procedure: CATARACT EXTRACTION PHACO AND INTRAOCULAR LENS PLACEMENT (IOC);  Surgeon: Shade Flood, MD;  Location: [facility] OR;  Service: Ophthalmology;  Laterality: Left;  . CORONARY ANGIOPLASTY WITH STENT PLACEMENT  August 2012   RCA DES - [facility]  . DEBRIDEMENT  FOOT Left    debriding diabetic foot ulcers  . EYE SURGERY    . FOREIGN BODY REMOVAL Right 2014   heel,  splinter removal   . LEFT AND RIGHT HEART CATHETERIZATION WITH CORONARY ANGIOGRAM N/A 01/31/2014   Procedure: LEFT AND RIGHT HEART CATHETERIZATION WITH CORONARY ANGIOGRAM;  Surgeon: Micheline Chapman, MD;  Location: [facility] CATH LAB;  Service: Cardiovascular;  Laterality: N/A;  . PARS PLANA VITRECTOMY Left 06/05/2013   Procedure: PARS PLANA VITRECTOMY WITH 23 GAUGE with Endolaser(constellation);  Surgeon: Shade Flood, MD;  Location: [facility] OR;  Service: Ophthalmology;  Laterality: Left;  . PILONIDAL CYST EXCISION N/A 01/08/2013   Procedure: CYST EXCISION PILONIDAL EXTENSIVE;  Surgeon: Axel Filler, MD;  Location: [facility] OR;  Service: General;  Laterality: N/A;  . PILONIDAL CYST EXCISION  1980's   "in [place]"  . TEE WITHOUT CARDIOVERSION N/A 09/23/2014   Procedure: TRANSESOPHAGEAL ECHOCARDIOGRAM (TEE);  Surgeon: Lewayne Bunting, MD;  Location: [facility] ENDOSCOPY;  Service: Cardiovascular;  Laterality: N/A;  . TONSILLECTOMY      Social History   Social History  . Marital status: Single    Spouse name: N/A  . Number of children: Y  . Years of education: N/A   Occupational History  . disabled truck  driver    Social History Main Topics  . Smoking status: Former Smoker    Packs/day: 1.00    Years: 20.00    Types: Cigarettes    Quit date: 07/13/1983  . Smokeless tobacco: Never Used  . Alcohol use No  . Drug use: No  . Sexual activity: Not Currently   Other Topics Concern  . Not on file   Social History Narrative   Pt is single.   Lives with sister.   Has children.   Was adopted.   Daily cafffiene-2 cups of coffee and 2 sodas per day          Family History  Problem Relation Age of Onset  . Adopted: Yes  . Other Other  PT ADOPTED    ROS: no fevers or chills, productive cough, hemoptysis, dysphasia, odynophagia, melena, hematochezia, dysuria, hematuria, rash, seizure activity, orthopnea, PND, pedal edema, claudication. Remaining systems are negative.  Physical Exam: Well-developed well-nourished in no acute distress.  Skin is warm and dry.  HEENT is normal.  Neck is supple.  Chest is clear to auscultation with normal expansion.  Cardiovascular exam is regular rate and rhythm.  Abdominal exam nontender or distended. No masses palpated. Extremities show no edema. neuro grossly intact  ECG

## 2016-04-06 ENCOUNTER — Ambulatory Visit: Payer: Medicare Other | Admitting: Cardiology

## 2016-04-06 ENCOUNTER — Encounter: Payer: Self-pay | Admitting: Cardiology

## 2016-04-08 ENCOUNTER — Encounter: Payer: Self-pay | Admitting: Endocrinology

## 2016-04-09 ENCOUNTER — Other Ambulatory Visit: Payer: Self-pay | Admitting: Cardiology

## 2016-04-09 NOTE — Telephone Encounter (Signed)
Rx(s) sent to pharmacy electronically.  

## 2016-04-20 ENCOUNTER — Encounter: Payer: Self-pay | Admitting: Endocrinology

## 2016-04-20 ENCOUNTER — Ambulatory Visit (INDEPENDENT_AMBULATORY_CARE_PROVIDER_SITE_OTHER): Payer: Medicare Other | Admitting: Endocrinology

## 2016-04-20 ENCOUNTER — Other Ambulatory Visit: Payer: Self-pay | Admitting: *Deleted

## 2016-04-20 VITALS — BP 132/74 | HR 102 | Temp 98.2°F | Resp 18 | Ht 72.0 in | Wt 369.0 lb

## 2016-04-20 DIAGNOSIS — Z794 Long term (current) use of insulin: Secondary | ICD-10-CM | POA: Diagnosis not present

## 2016-04-20 DIAGNOSIS — E1165 Type 2 diabetes mellitus with hyperglycemia: Secondary | ICD-10-CM

## 2016-04-20 MED ORDER — ACCU-CHEK AVIVA PLUS W/DEVICE KIT: PACK | 0 refills | Status: DC

## 2016-04-20 NOTE — Patient Instructions (Signed)
Toujeo 70 units, Humalog 80-90 depending on meal size

## 2016-04-20 NOTE — Progress Notes (Signed)
Patient ID: [name], male   DOB: [birth year], 65 y.o.   MRN: [id number]            Reason for Appointment:  Follow-up for Type 2 Diabetes    History of Present Illness:          Date of diagnosis of type 2 diabetes mellitus: 25       Background history:   Initially his diabetes was mild and was able to control it with metformin and also Actos. He was also able to exercise and try to lose weight However about 2 years after diagnosis he was started on insulin because of poor control, details not available. His metformin was stopped possibly because of kidney problems Also not clear what his level of control was in the last few years with taking various insulin regimens including Lantus and NovoLog He thinks he was tried on Trulicity for 3-4 months and it did not help her sugars or his weight Because of needing large doses of NovoLog his previous endocrinologist switched him to U-500 Regular Insulin with a syringe   However he thinks that he had blood sugars around 7% at some point with NovoLog and Lantus  Recent history:   INSULIN regimen is:    Toujeo 80 units daily along with Humalog U-200 insulin 100 units before meals  Non-insulin hypoglycemic drugs the patient is taking are: none  Current management, blood sugar patterns and problems identified:    He has been taking a combination of Toujeo and the Humalog U-200 now  His blood sugars are progressively improving since his last visit although he did not have his meter with him previously  He is trying to adjust his HUMALOG based on his blood sugar level and sometimes takes 110 units  Unable to properly analyze his blood sugar download as his meter has incorrect date on it  LOWEST blood sugars appear to be recently overnight with readings as low as 69  Highest blood sugars have been at variable times, highest 343 after lunch yesterday  His blood sugar was high yesterday morning because of probably over treating low  blood sugar of 64 the night before He was supposed to see the dietitian but had to cancel the visit  Side effects from medications have been: none  Compliance with the medical regimen:  poor Hypoglycemia:  occasionally in am  Glucose monitoring:  done 4+  times a day         Glucometer:  Accucheck Aviva     Blood Glucose readings by download as above   Self-care: The diet that the patient has been following is: none,  drinks  Regular colas  frequently,  Eating candy, no fried  Food usually.     Meal times are: variable, dinner 8-9 pm Typical meal intake: Breakfast is eggs or cheese              Dietician visit, most recent:  Years ago               Exercise:  none-not able to do much  Weight history:   Wt Readings from Last 3 Encounters:  04/20/16 (!) 369 lb (167.4 kg)  03/11/16 (!) 372 lb 12.8 oz (169.1 kg)  03/09/16 (!) 374 lb (169.6 kg)    Glycemic control:last A1c 8.9   Lab Results  Component Value Date   HGBA1C 8.9 03/03/2016   HGBA1C 8.7 10/14/2015   HGBA1C 10.5 (H) 07/09/2015   Lab Results  Component Value  Date   MICROALBUR 0.5 03/09/2016   LDLCALC 65 03/03/2016   CREATININE 1.77 (H) 03/11/2016   Lab Results  Component Value Date   MICRALBCREAT 13 03/09/2016         Medication List       Accurate as of 04/20/16  8:18 PM. Always use your most recent med list.          ACCU-CHEK AVIVA PLUS test strip Generic drug:  glucose blood   ACCU-CHEK AVIVA PLUS w/Device Kit Use to check blood sugar 6 times per day dx code E11.65   atorvastatin 80 MG tablet Commonly known as:  LIPITOR TAKE 1 TABLET BY MOUTH EVERY DAY   BD INSULIN SYRINGE ULTRAFINE 31G X 15/64" 0.3 ML Misc Generic drug:  Insulin Syringe-Needle U-100 USE TO IJNECT INSULIN 3 TIMES DAILY.   furosemide 80 MG tablet Commonly known as:  LASIX Take 1 tablet (80 mg total) by mouth 2 (two) times daily.   gabapentin 300 MG capsule Commonly known as:  NEURONTIN Take 2 capsules (600 mg  total) by mouth 2 (two) times daily.   hydrocerin Crea Apply 1 application topically daily.   Insulin Glargine 300 UNIT/ML Sopn Commonly known as:  TOUJEO SOLOSTAR Inject 80 Units into the skin daily.   Insulin Lispro 200 UNIT/ML Sopn Commonly known as:  HUMALOG KWIKPEN Inject 1 each into the skin 3 (three) times daily. Inject 80-100 units three times daily depending on meal size.   KLOR-CON M20 20 MEQ tablet Generic drug:  potassium chloride SA   lisinopril 10 MG tablet Commonly known as:  PRINIVIL,ZESTRIL   metolazone 5 MG tablet Commonly known as:  ZAROXOLYN Take 1 tablet (5 mg total) by mouth as needed (Take 30 minutes before Furosemide if weight goes over 375lbs.).   metoprolol 50 MG tablet Commonly known as:  LOPRESSOR Take 50 mg by mouth 2 (two) times daily. TWO TABLETS TWICE A DAY   multivitamin with minerals Tabs tablet Take 1 tablet by mouth daily.   mupirocin cream 2 % Commonly known as:  BACTROBAN Apply topically 2 (two) times daily.   NATURAL BALANCE TEARS OP Place 1 drop into both eyes daily as needed (for dry eyes).   Oxycodone HCl 20 MG Tabs Take 1 tablet (20 mg total) by mouth every 6 (six) hours as needed for severe pain.   rivaroxaban 20 MG Tabs tablet Commonly known as:  XARELTO Take 1 tablet (20 mg total) by mouth daily with supper.   spironolactone 25 MG tablet Commonly known as:  ALDACTONE Take 0.5 tablets (12.5 mg total) by mouth daily.       Allergies: No Known Allergies  Past Medical History:  Diagnosis Date  . Arthritis    "hands and lower back" (09/19/2014)  . CAD (coronary artery disease)    a. s/p PCI to RCA in 2012. b. prior cath in 01/2014 with elevated L/RH pressures, mild-mod CAD of LAD/LCx with patent RCA, normal EF, c/b CIN/CHF.  [name] Carpal tunnel syndrome, bilateral   . Cellulitis   . Chronic diastolic CHF (congestive heart failure) ([place])   . Chronic kidney disease (CKD), stage III (moderate)   . Chronic lower back pain     . History of blood transfusion ~ 1954   "related to OR"  . Hyperlipidemia   . Hypertension   . Hypoxia    a. Qualified for home O2 at [place] in 09/2014.  [name] Lower GI bleed   . Microcytic anemia   . Morbid obesity (  [place])   . Neuropathy ([place])   . OSA (obstructive sleep apnea)    "I wear nasal prongs; haven't been using prongs recently" (09/19/2014)  . PAF (paroxysmal atrial fibrillation) ([place])    TEE DCCV 09/23/2014  . Physical deconditioning   . Pilonidal cyst 1980's; 01/25/2013  . Scrotal abscess   . Type II diabetes mellitus ([place])     Past Surgical History:  Procedure Laterality Date  . ABDOMINAL SURGERY  ~ 1954   BENIGN TUMOR REMOVED  . APPENDECTOMY    . CARDIOVERSION  2010   Archie Endo 09/19/2014  . CARDIOVERSION N/A 09/23/2014   Procedure: CARDIOVERSION;  Surgeon: Lelon Perla, MD;  Location: [facility] ENDOSCOPY;  Service: Cardiovascular;  Laterality: N/A;  . CARDIOVERSION N/A 12/11/2014   Procedure: CARDIOVERSION;  Surgeon: Sanda Klein, MD;  Location: [place] ENDOSCOPY;  Service: Cardiovascular;  Laterality: N/A;  . CATARACT EXTRACTION W/PHACO Right 11/15/2012   Procedure: CATARACT EXTRACTION PHACO AND INTRAOCULAR LENS PLACEMENT ([place]);  Surgeon: Adonis Brook, MD;  Location: [place];  Service: Ophthalmology;  Laterality: Right;  . CATARACT EXTRACTION W/PHACO Left 11/29/2012   Procedure: CATARACT EXTRACTION PHACO AND INTRAOCULAR LENS PLACEMENT (IOC);  Surgeon: Adonis Brook, MD;  Location: [place];  Service: Ophthalmology;  Laterality: Left;  . CORONARY ANGIOPLASTY WITH STENT PLACEMENT  August 2012   RCA DES - [facility]  . DEBRIDEMENT  FOOT Left    debriding diabetic foot ulcers  . EYE SURGERY    . FOREIGN BODY REMOVAL Right 2014   heel,  splinter removal   . LEFT AND RIGHT HEART CATHETERIZATION WITH CORONARY ANGIOGRAM N/A 01/31/2014   Procedure: LEFT AND RIGHT HEART CATHETERIZATION WITH CORONARY ANGIOGRAM;  Surgeon: Blane Ohara, MD;  Location: [facility] CATH LAB;   Service: Cardiovascular;  Laterality: N/A;  . PARS PLANA VITRECTOMY Left 06/05/2013   Procedure: PARS PLANA VITRECTOMY WITH 23 GAUGE with Endolaser(constellation);  Surgeon: Adonis Brook, MD;  Location: [place];  Service: Ophthalmology;  Laterality: Left;  . PILONIDAL CYST EXCISION N/A 01/08/2013   Procedure: CYST EXCISION PILONIDAL EXTENSIVE;  Surgeon: Ralene Ok, MD;  Location: [place];  Service: General;  Laterality: N/A;  . PILONIDAL CYST EXCISION  1980's   "in [place]"  . TEE WITHOUT CARDIOVERSION N/A 09/23/2014   Procedure: TRANSESOPHAGEAL ECHOCARDIOGRAM (TEE);  Surgeon: Lelon Perla, MD;  Location[facility] ENDOSCOPY;  Service: Cardiovascular;  Laterality: N/A;  . TONSILLECTOMY      Family History  Problem Relation Age of Onset  . Adopted: Yes  . Other Other     PT ADOPTED    Social History:  reports that he quit smoking about 32 years ago. His smoking use included Cigarettes. He has a 20.00 pack-year smoking history. He has never used smokeless tobacco. He reports that he does not drink alcohol or use drugs.   Review of Systems   Lipid history: On 80 mg Lipitor, has history of CAD    Lab Results  Component Value Date   CHOL 127 03/03/2016   HDL 28.10 (L) 03/03/2016   LDLCALC 65 03/03/2016   LDLDIRECT 82.1 08/31/2011   TRIG 170.0 (H) 03/03/2016   CHOLHDL 5 03/03/2016           Hypertension: Present for several years, on lisinopril 10 mg daily along with metoprolol Not clear if he is still taking lisinopril now  On Lasix 80 mg for history of CHF along with Aldactone-Zaroxolyn has been stopped Blood pressure is controlledWells On his last visit he was getting  orthostatic and has had adjustment of diuretics cardiologist with improved renal function  Lab Results  Component Value Date   CREATININE 1.77 (H) 03/11/2016   BUN 37 (H) 03/11/2016   NA 135 03/11/2016   K 3.7 03/11/2016   CL 99 03/11/2016   CO2 26 03/11/2016      Most recent eye exam was 2015  Most  recent foot exam: 8/17  Lab Results  Component Value Date   TSH 1.17 03/03/2016     LABS:  No visits with results within 1 Week(s) from this visit.  Latest known visit with results is:  Office Visit on 03/11/2016  Component Date Value Ref Range Status  . Sodium 03/11/2016 135  135 - 146 mmol/L Final  . Potassium 03/11/2016 3.7  3.5 - 5.3 mmol/L Final  . Chloride 03/11/2016 99  98 - 110 mmol/L Final  . CO2 03/11/2016 26  20 - 31 mmol/L Final  . Glucose, Bld 03/11/2016 153* 65 - 99 mg/dL Final  . BUN 03/11/2016 37* 7 - 25 mg/dL Final  . Creat 03/11/2016 1.77* 0.70 - 1.25 mg/dL Final   Comment:   For patients > or = 64 years of age: The upper reference limit for Creatinine is approximately 13% higher for people identified as African-American.     . Calcium 03/11/2016 9.1  8.6 - 10.3 mg/dL Final  . GFR, Est African American 03/11/2016 46* >=60 mL/min Final  . GFR, Est Non African American 03/11/2016 40* >=60 mL/min Final  . WBC 03/11/2016 12.6* 3.8 - 10.8 K/uL Final  . RBC 03/11/2016 4.36  4.20 - 5.80 MIL/uL Final  . Hemoglobin 03/11/2016 10.9* 13.2 - 17.1 g/dL Final  . HCT 03/11/2016 33.1* 38.5 - 50.0 % Final  . MCV 03/11/2016 75.9* 80.0 - 100.0 fL Final  . MCH 03/11/2016 25.0* 27.0 - 33.0 pg Final  . MCHC 03/11/2016 32.9  32.0 - 36.0 g/dL Final  . RDW 03/11/2016 16.7* 11.0 - 15.0 % Final  . Platelets 03/11/2016 291  140 - 400 K/uL Final  . MPV 03/11/2016 9.1  7.5 - 12.5 fL Final  . Brain Natriuretic Peptide 03/12/2016 130.0* <100 pg/mL Final   Comment:   BNP levels increase with age in the general population with the highest values seen in individuals greater than 23 years of age. Reference: Joellyn Rued Cardiol 2002; 64:403-47.       Physical Examination:  BP 132/74   Pulse (!) 102   Temp 98.2 F (36.8 C)   Resp 18   Ht 6' (1.829 m)   Wt (!) 369 lb (167.4 kg)   SpO2 94%   BMI 50.05 kg/m          ASSESSMENT:  Diabetes type 2, uncontrolled  See history of  present illness for detailed discussion of current diabetes management, blood sugar patterns and problems identified  His blood sugars appear to be overall better with regimen of Toujeo and Humalog Still needing very large doses of insulin especially mealtime doses He does however adjust his insulin based on pre-meal blood sugar rather than what he is eating Difficult to get her blood sugar patterns as his monitor has incorrect date and time and only list of blood sugars as available  He does appear to be having low normal readings overnight recently Some of his high readings may be related to over treating low normal readings or skipping insulin when blood sugars are low normal  Recommendations:  Reduce Toujeo by 10 units, discussedthat this  will be adjusted based on fasting readings  Reduce Humalog by 10 units  Adjust Humalog more based on what he is planning to eat  Do not overtreat low normal blood sugars  More consistent monitoring  New Accu-Chek Aviva monitoring will be prescribed  Encouraged him to be more active  Discussed timing of glucose monitoring including some 2 hours after meals     Rescheduled consultation with dietitian  Recheck A1c on next visit  Patient Instructions  Toujeo 70 units, Humalog 80-90 depending on meal size Counseling time on subjects discussed above is over 50% of today's 25 minute visit   [name] 04/20/2016, 8:18 PM   Note: This office note was prepared with Dragon voice recognition system technology. Any transcriptional errors that result from this process are unintentional.

## 2016-04-23 LAB — HM DIABETES EYE EXAM

## 2016-05-04 ENCOUNTER — Encounter: Payer: Self-pay | Admitting: Internal Medicine

## 2016-05-04 NOTE — Progress Notes (Signed)
Note from Dr Dione Booze, optho. 04/23/16 Mild- mod dm retionopathy OD and regressed prolif. Dm OS, sp 360 laser panretinal photocoag. Fu 1 year.  Will scan notes

## 2016-05-11 ENCOUNTER — Ambulatory Visit: Payer: Medicare Other | Admitting: Internal Medicine

## 2016-05-14 ENCOUNTER — Ambulatory Visit: Payer: Medicare Other | Admitting: Internal Medicine

## 2016-05-19 NOTE — Progress Notes (Deleted)
HPI: FU atrial fibrillation, chronic diastolic CHF and CAD. Pt with h/o PAF requiring DCCV. Patient had PCI with DES of his right coronary artery in August of 2012 in [place]. ABIs in April of 2013 were unremarkable. Seen 5/15 for dyspnea; weight gain noted; lasix increased. Nuclear study 6/15 showed EF 61 with partially reversible apical lateral defect. Cardiac catheterization July 2015 showed a 40-50% LAD, 50% circumflex and 30% right coronary artery. Normal LV systolic function. Last echocardiogram May 2016 showed normal LV systolic function, moderate left ventricular hypertrophy and mild left atrial enlargement. Seen with syncope August 2017 felt secondary to hypertension. Since last seen,   Current Outpatient Prescriptions  Medication Sig Dispense Refill  . ACCU-CHEK AVIVA PLUS test strip     . atorvastatin (LIPITOR) 80 MG tablet TAKE 1 TABLET BY MOUTH EVERY DAY 30 tablet 10  . BD INSULIN SYRINGE ULTRAFINE 31G X 15/64" 0.3 ML MISC USE TO IJNECT INSULIN 3 TIMES DAILY.  5  . Blood Glucose Monitoring Suppl (ACCU-CHEK AVIVA PLUS) w/Device KIT Use to check blood sugar 6 times per day dx code E11.65 1 kit 0  . furosemide (LASIX) 80 MG tablet Take 1 tablet (80 mg total) by mouth 2 (two) times daily. 180 tablet 3  . gabapentin (NEURONTIN) 300 MG capsule Take 2 capsules (600 mg total) by mouth 2 (two) times daily. 60 capsule 0  . hydrocerin (EUCERIN) CREA Apply 1 application topically daily. (Patient not taking: Reported on 04/20/2016) 113 g 0  . Hypromellose (NATURAL BALANCE TEARS OP) Place 1 drop into both eyes daily as needed (for dry eyes).     . Insulin Glargine (TOUJEO SOLOSTAR) 300 UNIT/ML SOPN Inject 80 Units into the skin daily. 30 mL 2  . Insulin Lispro (HUMALOG KWIKPEN) 200 UNIT/ML SOPN Inject 1 each into the skin 3 (three) times daily. Inject 80-100 units three times daily depending on meal size. 45 mL 2  . KLOR-CON M20 20 MEQ tablet     . lisinopril (PRINIVIL,ZESTRIL) 10 MG  tablet     . metoprolol (LOPRESSOR) 50 MG tablet Take 50 mg by mouth 2 (two) times daily. TWO TABLETS TWICE A DAY    . Multiple Vitamin (MULTIVITAMIN WITH MINERALS) TABS Take 1 tablet by mouth daily.    . mupirocin cream (BACTROBAN) 2 % Apply topically 2 (two) times daily. (Patient not taking: Reported on 04/20/2016) 15 g 0  . oxyCODONE 20 MG TABS Take 1 tablet (20 mg total) by mouth every 6 (six) hours as needed for severe pain. 30 tablet 0  . rivaroxaban (XARELTO) 20 MG TABS tablet Take 1 tablet (20 mg total) by mouth daily with supper. 30 tablet 3  . spironolactone (ALDACTONE) 25 MG tablet Take 0.5 tablets (12.5 mg total) by mouth daily. 90 tablet 3   No current facility-administered medications for this visit.      Past Medical History:  Diagnosis Date  . Arthritis    "hands and lower back" (09/19/2014)  . CAD (coronary artery disease)    a. s/p PCI to RCA in 2012. b. prior cath in 01/2014 with elevated L/RH pressures, mild-mod CAD of LAD/LCx with patent RCA, normal EF, c/b CIN/CHF.  [name] Carpal tunnel syndrome, bilateral   . Cellulitis   . Chronic diastolic CHF (congestive heart failure) ([place])   . Chronic kidney disease (CKD), stage III (moderate)   . Chronic lower back pain   . History of blood transfusion ~ 1954   "related to OR"  .  Hyperlipidemia   . Hypertension   . Hypoxia    a. Qualified for home O2 at [place] in 09/2014.  [name] Lower GI bleed   . Microcytic anemia   . Morbid obesity ([place])   . Neuropathy ([place])   . OSA (obstructive sleep apnea)    "I wear nasal prongs; haven't been using prongs recently" (09/19/2014)  . PAF (paroxysmal atrial fibrillation) ([place])    TEE DCCV 09/23/2014  . Physical deconditioning   . Pilonidal cyst 1980's; 01/25/2013  . Scrotal abscess   . Type II diabetes mellitus ([place])     Past Surgical History:  Procedure Laterality Date  . ABDOMINAL SURGERY  ~ 1954   BENIGN TUMOR REMOVED  . APPENDECTOMY    . CARDIOVERSION  2010   Archie Endo 09/19/2014  .  CARDIOVERSION N/A 09/23/2014   Procedure: CARDIOVERSION;  Surgeon: Lelon Perla, MD;  Location: [facility] ENDOSCOPY;  Service: Cardiovascular;  Laterality: N/A;  . CARDIOVERSION N/A 12/11/2014   Procedure: CARDIOVERSION;  Surgeon: Sanda Klein, MD;  Location: [place] ENDOSCOPY;  Service: Cardiovascular;  Laterality: N/A;  . CATARACT EXTRACTION W/PHACO Right 11/15/2012   Procedure: CATARACT EXTRACTION PHACO AND INTRAOCULAR LENS PLACEMENT ([place]);  Surgeon: Adonis Brook, MD;  Location: [place];  Service: Ophthalmology;  Laterality: Right;  . CATARACT EXTRACTION W/PHACO Left 11/29/2012   Procedure: CATARACT EXTRACTION PHACO AND INTRAOCULAR LENS PLACEMENT (IOC);  Surgeon: Adonis Brook, MD;  Location: [place];  Service: Ophthalmology;  Laterality: Left;  . CORONARY ANGIOPLASTY WITH STENT PLACEMENT  August 2012   RCA DES - [facility]  . DEBRIDEMENT  FOOT Left    debriding diabetic foot ulcers  . EYE SURGERY    . FOREIGN BODY REMOVAL Right 2014   heel,  splinter removal   . LEFT AND RIGHT HEART CATHETERIZATION WITH CORONARY ANGIOGRAM N/A 01/31/2014   Procedure: LEFT AND RIGHT HEART CATHETERIZATION WITH CORONARY ANGIOGRAM;  Surgeon: Blane Ohara, MD;  Location: [facility] CATH LAB;  Service: Cardiovascular;  Laterality: N/A;  . PARS PLANA VITRECTOMY Left 06/05/2013   Procedure: PARS PLANA VITRECTOMY WITH 23 GAUGE with Endolaser(constellation);  Surgeon: Adonis Brook, MD;  Location: [place];  Service: Ophthalmology;  Laterality: Left;  . PILONIDAL CYST EXCISION N/A 01/08/2013   Procedure: CYST EXCISION PILONIDAL EXTENSIVE;  Surgeon: Ralene Ok, MD;  Location: [place];  Service: General;  Laterality: N/A;  . PILONIDAL CYST EXCISION  1980's   "in [place]"  . TEE WITHOUT CARDIOVERSION N/A 09/23/2014   Procedure: TRANSESOPHAGEAL ECHOCARDIOGRAM (TEE);  Surgeon: Lelon Perla, MD;  Location: [facility] ENDOSCOPY;  Service: Cardiovascular;  Laterality: N/A;  . TONSILLECTOMY      Social History   Social  History  . Marital status: Single    Spouse name: N/A  . Number of children: Y  . Years of education: N/A   Occupational History  . disabled truck driver    Social History Main Topics  . Smoking status: Former Smoker    Packs/day: 1.00    Years: 20.00    Types: Cigarettes    Quit date: 07/13/1983  . Smokeless tobacco: Never Used  . Alcohol use No  . Drug use: No  . Sexual activity: Not Currently   Other Topics Concern  . Not on file   Social History Narrative   Pt is single.   Lives with sister.   Has children.   Was adopted.   Daily cafffiene-2 cups of coffee and 2 sodas per day  Family History  Problem Relation Age of Onset  . Adopted: Yes  . Other Other     PT ADOPTED    ROS: no fevers or chills, productive cough, hemoptysis, dysphasia, odynophagia, melena, hematochezia, dysuria, hematuria, rash, seizure activity, orthopnea, PND, pedal edema, claudication. Remaining systems are negative.  Physical Exam: Well-developed well-nourished in no acute distress.  Skin is warm and dry.  HEENT is normal.  Neck is supple.  Chest is clear to auscultation with normal expansion.  Cardiovascular exam is regular rate and rhythm.  Abdominal exam nontender or distended. No masses palpated. Extremities show no edema. neuro grossly intact  ECG

## 2016-05-24 ENCOUNTER — Ambulatory Visit: Payer: Medicare Other | Admitting: Cardiology

## 2016-05-25 ENCOUNTER — Ambulatory Visit: Payer: Medicare Other | Attending: Internal Medicine | Admitting: Internal Medicine

## 2016-05-25 ENCOUNTER — Encounter: Payer: Self-pay | Admitting: Internal Medicine

## 2016-05-25 VITALS — BP 147/74 | HR 92 | Temp 97.8°F | Resp 24 | Ht 72.0 in | Wt 378.0 lb

## 2016-05-25 DIAGNOSIS — N183 Chronic kidney disease, stage 3 unspecified: Secondary | ICD-10-CM

## 2016-05-25 DIAGNOSIS — E113299 Type 2 diabetes mellitus with mild nonproliferative diabetic retinopathy without macular edema, unspecified eye: Secondary | ICD-10-CM | POA: Diagnosis not present

## 2016-05-25 DIAGNOSIS — I482 Chronic atrial fibrillation: Secondary | ICD-10-CM | POA: Diagnosis not present

## 2016-05-25 DIAGNOSIS — E1140 Type 2 diabetes mellitus with diabetic neuropathy, unspecified: Secondary | ICD-10-CM | POA: Diagnosis not present

## 2016-05-25 DIAGNOSIS — I5032 Chronic diastolic (congestive) heart failure: Secondary | ICD-10-CM | POA: Diagnosis not present

## 2016-05-25 DIAGNOSIS — Z794 Long term (current) use of insulin: Secondary | ICD-10-CM | POA: Diagnosis not present

## 2016-05-25 DIAGNOSIS — Z6841 Body Mass Index (BMI) 40.0 and over, adult: Secondary | ICD-10-CM | POA: Insufficient documentation

## 2016-05-25 DIAGNOSIS — I2510 Atherosclerotic heart disease of native coronary artery without angina pectoris: Secondary | ICD-10-CM | POA: Insufficient documentation

## 2016-05-25 DIAGNOSIS — I130 Hypertensive heart and chronic kidney disease with heart failure and stage 1 through stage 4 chronic kidney disease, or unspecified chronic kidney disease: Secondary | ICD-10-CM | POA: Diagnosis not present

## 2016-05-25 DIAGNOSIS — E1122 Type 2 diabetes mellitus with diabetic chronic kidney disease: Secondary | ICD-10-CM | POA: Diagnosis not present

## 2016-05-25 DIAGNOSIS — Z7901 Long term (current) use of anticoagulants: Secondary | ICD-10-CM | POA: Diagnosis not present

## 2016-05-25 DIAGNOSIS — I878 Other specified disorders of veins: Secondary | ICD-10-CM | POA: Insufficient documentation

## 2016-05-25 DIAGNOSIS — G4733 Obstructive sleep apnea (adult) (pediatric): Secondary | ICD-10-CM

## 2016-05-25 DIAGNOSIS — Z79899 Other long term (current) drug therapy: Secondary | ICD-10-CM | POA: Diagnosis not present

## 2016-05-25 DIAGNOSIS — M1990 Unspecified osteoarthritis, unspecified site: Secondary | ICD-10-CM | POA: Diagnosis not present

## 2016-05-25 DIAGNOSIS — E11319 Type 2 diabetes mellitus with unspecified diabetic retinopathy without macular edema: Secondary | ICD-10-CM | POA: Insufficient documentation

## 2016-05-25 DIAGNOSIS — R21 Rash and other nonspecific skin eruption: Secondary | ICD-10-CM | POA: Diagnosis present

## 2016-05-25 DIAGNOSIS — Z1211 Encounter for screening for malignant neoplasm of colon: Secondary | ICD-10-CM | POA: Diagnosis not present

## 2016-05-25 DIAGNOSIS — J9610 Chronic respiratory failure, unspecified whether with hypoxia or hypercapnia: Secondary | ICD-10-CM | POA: Diagnosis not present

## 2016-05-25 DIAGNOSIS — E785 Hyperlipidemia, unspecified: Secondary | ICD-10-CM | POA: Insufficient documentation

## 2016-05-25 DIAGNOSIS — Z9981 Dependence on supplemental oxygen: Secondary | ICD-10-CM | POA: Diagnosis not present

## 2016-05-25 LAB — BASIC METABOLIC PANEL WITH GFR
BUN: 14 mg/dL (ref 7–25)
CHLORIDE: 99 mmol/L (ref 98–110)
CO2: 31 mmol/L (ref 20–31)
CREATININE: 1.16 mg/dL (ref 0.70–1.25)
Calcium: 8.9 mg/dL (ref 8.6–10.3)
GFR, Est African American: 77 mL/min (ref >=60)
GFR, Est Non African American: 66 mL/min (ref >=60)
GLUCOSE: 143 mg/dL — AB (ref 65–99)
POTASSIUM: 3.7 mmol/L (ref 3.5–5.3)
Sodium: 140 mmol/L (ref 135–146)

## 2016-05-25 MED ORDER — RIVAROXABAN 20 MG PO TABS: 20.0000 mg | ORAL_TABLET | Freq: Every day | ORAL | 3 refills | Status: DC

## 2016-05-25 MED ORDER — HYDROCERIN EX CREA: 1.0000 "application " | TOPICAL_CREAM | Freq: Three times a day (TID) | CUTANEOUS | 3 refills | Status: AC

## 2016-05-25 MED ORDER — GABAPENTIN 300 MG PO CAPS: 600.0000 mg | ORAL_CAPSULE | Freq: Two times a day (BID) | ORAL | 3 refills | Status: AC

## 2016-05-25 MED ORDER — ATORVASTATIN CALCIUM 80 MG PO TABS: 80.0000 mg | ORAL_TABLET | Freq: Every day | ORAL | 10 refills | Status: AC

## 2016-05-25 MED ORDER — FUROSEMIDE 80 MG PO TABS: 80.0000 mg | ORAL_TABLET | Freq: Two times a day (BID) | ORAL | 3 refills | Status: DC

## 2016-05-25 NOTE — Progress Notes (Deleted)
HPI: FU atrial fibrillation and CAD. Pt has had previous DCCV of atrial fibrillation. Patient had PCI of his right coronary artery with DES in August of 2012 in [place]. ABIs in April of 2013 were unremarkable. Cardiac cath 7/15 showed 40-50 LAD, 50 Lcx and 30 RCA; patent RCA stent. Last echo 5/16 showed normal LV function; mild LAE. Now in permanent atrial fibrillation treated with rate control and anticoagulation. Also with diastolic CHF. Since last seen,   Current Outpatient Prescriptions  Medication Sig Dispense Refill  . ACCU-CHEK AVIVA PLUS test strip     . atorvastatin (LIPITOR) 80 MG tablet Take 1 tablet (80 mg total) by mouth daily. 30 tablet 10  . BD INSULIN SYRINGE ULTRAFINE 31G X 15/64" 0.3 ML MISC USE TO IJNECT INSULIN 3 TIMES DAILY.  5  . Blood Glucose Monitoring Suppl (ACCU-CHEK AVIVA PLUS) w/Device KIT Use to check blood sugar 6 times per day dx code E11.65 1 kit 0  . furosemide (LASIX) 80 MG tablet Take 1 tablet (80 mg total) by mouth 2 (two) times daily. 180 tablet 3  . gabapentin (NEURONTIN) 300 MG capsule Take 2 capsules (600 mg total) by mouth 2 (two) times daily. 60 capsule 3  . hydrocerin (EUCERIN) CREA Apply 1 application topically 3 (three) times daily. legs 454 g 3  . Hypromellose (NATURAL BALANCE TEARS OP) Place 1 drop into both eyes daily as needed (for dry eyes).     . Insulin Glargine (TOUJEO SOLOSTAR) 300 UNIT/ML SOPN Inject 80 Units into the skin daily. 30 mL 2  . Insulin Lispro (HUMALOG KWIKPEN) 200 UNIT/ML SOPN Inject 1 each into the skin 3 (three) times daily. Inject 80-100 units three times daily depending on meal size. 45 mL 2  . KLOR-CON M20 20 MEQ tablet     . lisinopril (PRINIVIL,ZESTRIL) 10 MG tablet     . metoprolol (LOPRESSOR) 50 MG tablet Take 50 mg by mouth 2 (two) times daily. TWO TABLETS TWICE A DAY    . Multiple Vitamin (MULTIVITAMIN WITH MINERALS) TABS Take 1 tablet by mouth daily.    . mupirocin cream (BACTROBAN) 2 % Apply topically  2 (two) times daily. (Patient not taking: Reported on 04/20/2016) 15 g 0  . oxyCODONE 20 MG TABS Take 1 tablet (20 mg total) by mouth every 6 (six) hours as needed for severe pain. 30 tablet 0  . rivaroxaban (XARELTO) 20 MG TABS tablet Take 1 tablet (20 mg total) by mouth daily with supper. 30 tablet 3  . spironolactone (ALDACTONE) 25 MG tablet Take 0.5 tablets (12.5 mg total) by mouth daily. 90 tablet 3   No current facility-administered medications for this visit.      Past Medical History:  Diagnosis Date  . Arthritis    "hands and lower back" (09/19/2014)  . CAD (coronary artery disease)    a. s/p PCI to RCA in 2012. b. prior cath in 01/2014 with elevated L/RH pressures, mild-mod CAD of LAD/LCx with patent RCA, normal EF, c/b CIN/CHF.  [name] Carpal tunnel syndrome, bilateral   . Cellulitis   . Chronic diastolic CHF (congestive heart failure) ([place])   . Chronic kidney disease (CKD), stage III (moderate)   . Chronic lower back pain   . History of blood transfusion ~ 1954   "related to OR"  . Hyperlipidemia   . Hypertension   . Hypoxia    a. Qualified for home O2 at [place] in 09/2014.  [name] Lower GI bleed   .  Microcytic anemia   . Morbid obesity ([place])   . Neuropathy ([place])   . OSA (obstructive sleep apnea)    "I wear nasal prongs; haven't been using prongs recently" (09/19/2014)  . PAF (paroxysmal atrial fibrillation) ([place])    TEE DCCV 09/23/2014  . Physical deconditioning   . Pilonidal cyst 1980's; 01/25/2013  . Scrotal abscess   . Type II diabetes mellitus ([place])     Past Surgical History:  Procedure Laterality Date  . ABDOMINAL SURGERY  ~ 1954   BENIGN TUMOR REMOVED  . APPENDECTOMY    . CARDIOVERSION  2010   Archie Endo 09/19/2014  . CARDIOVERSION N/A 09/23/2014   Procedure: CARDIOVERSION;  Surgeon: Lelon Perla, MD;  Location: [facility] ENDOSCOPY;  Service: Cardiovascular;  Laterality: N/A;  . CARDIOVERSION N/A 12/11/2014   Procedure: CARDIOVERSION;  Surgeon: Sanda Klein, MD;  Location: [place]  ENDOSCOPY;  Service: Cardiovascular;  Laterality: N/A;  . CATARACT EXTRACTION W/PHACO Right 11/15/2012   Procedure: CATARACT EXTRACTION PHACO AND INTRAOCULAR LENS PLACEMENT ([place]);  Surgeon: Adonis Brook, MD;  Location: [place];  Service: Ophthalmology;  Laterality: Right;  . CATARACT EXTRACTION W/PHACO Left 11/29/2012   Procedure: CATARACT EXTRACTION PHACO AND INTRAOCULAR LENS PLACEMENT (IOC);  Surgeon: Adonis Brook, MD;  Location: [place];  Service: Ophthalmology;  Laterality: Left;  . CORONARY ANGIOPLASTY WITH STENT PLACEMENT  August 2012   RCA DES - [facility]  . DEBRIDEMENT  FOOT Left    debriding diabetic foot ulcers  . EYE SURGERY    . FOREIGN BODY REMOVAL Right 2014   heel,  splinter removal   . LEFT AND RIGHT HEART CATHETERIZATION WITH CORONARY ANGIOGRAM N/A 01/31/2014   Procedure: LEFT AND RIGHT HEART CATHETERIZATION WITH CORONARY ANGIOGRAM;  Surgeon: Blane Ohara, MD;  Location: [facility] CATH LAB;  Service: Cardiovascular;  Laterality: N/A;  . PARS PLANA VITRECTOMY Left 06/05/2013   Procedure: PARS PLANA VITRECTOMY WITH 23 GAUGE with Endolaser(constellation);  Surgeon: Adonis Brook, MD;  Location: [place];  Service: Ophthalmology;  Laterality: Left;  . PILONIDAL CYST EXCISION N/A 01/08/2013   Procedure: CYST EXCISION PILONIDAL EXTENSIVE;  Surgeon: Ralene Ok, MD;  Location: [place];  Service: General;  Laterality: N/A;  . PILONIDAL CYST EXCISION  1980's   "in [place]"  . TEE WITHOUT CARDIOVERSION N/A 09/23/2014   Procedure: TRANSESOPHAGEAL ECHOCARDIOGRAM (TEE);  Surgeon: Lelon Perla, MD;  Location: [facility] ENDOSCOPY;  Service: Cardiovascular;  Laterality: N/A;  . TONSILLECTOMY      Social History   Social History  . Marital status: Single    Spouse name: N/A  . Number of children: Y  . Years of education: N/A   Occupational History  . disabled truck driver    Social History Main Topics  . Smoking status: Former Smoker    Packs/day: 1.00    Years:  20.00    Types: Cigarettes    Quit date: 07/13/1983  . Smokeless tobacco: Never Used  . Alcohol use No  . Drug use: No  . Sexual activity: Not Currently   Other Topics Concern  . Not on file   Social History Narrative   Pt is single.   Lives with sister.   Has children.   Was adopted.   Daily cafffiene-2 cups of coffee and 2 sodas per day          Family History  Problem Relation Age of Onset  . Adopted: Yes  . Other Other     PT ADOPTED    ROS:  no fevers or chills, productive cough, hemoptysis, dysphasia, odynophagia, melena, hematochezia, dysuria, hematuria, rash, seizure activity, orthopnea, PND, pedal edema, claudication. Remaining systems are negative.  Physical Exam: Well-developed well-nourished in no acute distress.  Skin is warm and dry.  HEENT is normal.  Neck is supple.  Chest is clear to auscultation with normal expansion.  Cardiovascular exam is regular rate and rhythm.  Abdominal exam nontender or distended. No masses palpated. Extremities show no edema. neuro grossly intact  ECG

## 2016-05-25 NOTE — Progress Notes (Signed)
Pt has back 6 leg 6 and hand 8 pain

## 2016-05-25 NOTE — Patient Instructions (Signed)
DASH Eating Plan DASH stands for "Dietary Approaches to Stop Hypertension." The DASH eating plan is a healthy eating plan that has been shown to reduce high blood pressure (hypertension). Additional health benefits may include reducing the risk of type 2 diabetes mellitus, heart disease, and stroke. The DASH eating plan may also help with weight loss. What do I need to know about the DASH eating plan? For the DASH eating plan, you will follow these general guidelines:  Choose foods with less than 150 milligrams of sodium per serving (as listed on the food label).  Use salt-free seasonings or herbs instead of table salt or sea salt.  Check with your health care provider or pharmacist before using salt substitutes.  Eat lower-sodium products. These are often labeled as "low-sodium" or "no salt added."  Eat fresh foods. Avoid eating a lot of canned foods.  Eat more vegetables, fruits, and low-fat dairy products.  Choose whole grains. Look for the word "whole" as the first word in the ingredient list.  Choose fish and skinless chicken or turkey more often than red meat. Limit fish, poultry, and meat to 6 oz (170 g) each day.  Limit sweets, desserts, sugars, and sugary drinks.  Choose heart-healthy fats.  Eat more home-cooked food and less restaurant, buffet, and fast food.  Limit fried foods.  Do not fry foods. Cook foods using methods such as baking, boiling, grilling, and broiling instead.  When eating at a restaurant, ask that your food be prepared with less salt, or no salt if possible. What foods can I eat? Seek help from a dietitian for individual calorie needs. Grains  Whole grain or whole wheat bread. Brown rice. Whole grain or whole wheat pasta. Quinoa, bulgur, and whole grain cereals. Low-sodium cereals. Corn or whole wheat flour tortillas. Whole grain cornbread. Whole grain crackers. Low-sodium crackers. Vegetables  Fresh or frozen vegetables (raw, steamed, roasted, or  grilled). Low-sodium or reduced-sodium tomato and vegetable juices. Low-sodium or reduced-sodium tomato sauce and paste. Low-sodium or reduced-sodium canned vegetables. Fruits  All fresh, canned (in natural juice), or frozen fruits. Meat and Other Protein Products  Ground beef (85% or leaner), grass-fed beef, or beef trimmed of fat. Skinless chicken or turkey. Ground chicken or turkey. Pork trimmed of fat. All fish and seafood. Eggs. Dried beans, peas, or lentils. Unsalted nuts and seeds. Unsalted canned beans. Dairy  Low-fat dairy products, such as skim or 1% milk, 2% or reduced-fat cheeses, low-fat ricotta or cottage cheese, or plain low-fat yogurt. Low-sodium or reduced-sodium cheeses. Fats and Oils  Tub margarines without trans fats. Light or reduced-fat mayonnaise and salad dressings (reduced sodium). Avocado. Safflower, olive, or canola oils. Natural peanut or almond butter. Other  Unsalted popcorn and pretzels. The items listed above may not be a complete list of recommended foods or beverages. Contact your dietitian for more options.  What foods are not recommended? Grains  White bread. White pasta. White rice. Refined cornbread. Bagels and croissants. Crackers that contain trans fat. Vegetables  Creamed or fried vegetables. Vegetables in a cheese sauce. Regular canned vegetables. Regular canned tomato sauce and paste. Regular tomato and vegetable juices. Fruits  Canned fruit in light or heavy syrup. Fruit juice. Meat and Other Protein Products  Fatty cuts of meat. Ribs, chicken wings, bacon, sausage, bologna, salami, chitterlings, fatback, hot dogs, bratwurst, and packaged luncheon meats. Salted nuts and seeds. Canned beans with salt. Dairy  Whole or 2% milk, cream, half-and-half, and cream cheese. Whole-fat or sweetened yogurt. Full-fat cheeses   or blue cheese. Nondairy creamers and whipped toppings. Processed cheese, cheese spreads, or cheese curds. Condiments  Onion and garlic  salt, seasoned salt, table salt, and sea salt. Canned and packaged gravies. Worcestershire sauce. Tartar sauce. Barbecue sauce. Teriyaki sauce. Soy sauce, including reduced sodium. Steak sauce. Fish sauce. Oyster sauce. Cocktail sauce. Horseradish. Ketchup and mustard. Meat flavorings and tenderizers. Bouillon cubes. Hot sauce. Tabasco sauce. Marinades. Taco seasonings. Relishes. Fats and Oils  Butter, stick margarine, lard, shortening, ghee, and bacon fat. Coconut, palm kernel, or palm oils. Regular salad dressings. Other  Pickles and olives. Salted popcorn and pretzels. The items listed above may not be a complete list of foods and beverages to avoid. Contact your dietitian for more information.  Where can I find more information? National Heart, Lung, and Blood Institute: [employer] This information is not intended to replace advice given to you by your health care provider. Make sure you discuss any questions you have with your health care provider. Document Released: 06/17/2011 Document Revised: 12/04/2015 Document Reviewed: 05/02/2013 Elsevier Interactive Patient Education  2017 Elsevier Inc.   -  Hypertension Hypertension is another name for high blood pressure. High blood pressure forces your heart to work harder to pump blood. A blood pressure reading has two numbers, which includes a higher number over a lower number (example: 110/72). Follow these instructions at home:  Have your blood pressure rechecked by your doctor.  Only take medicine as told by your doctor. Follow the directions carefully. The medicine does not work as well if you skip doses. Skipping doses also puts you at risk for problems.  Do not smoke.  Monitor your blood pressure at home as told by your doctor. Contact a doctor if:  You think you are having a reaction to the medicine you are taking.  You have repeat headaches or feel dizzy.  You have puffiness (swelling)  in your ankles.  You have trouble with your vision. Get help right away if:  You get a very bad headache and are confused.  You feel weak, numb, or faint.  You get chest or belly (abdominal) pain.  You throw up (vomit).  You cannot breathe very well. This information is not intended to replace advice given to you by your health care provider. Make sure you discuss any questions you have with your health care provider. Document Released: 12/15/2007 Document Revised: 12/04/2015 Document Reviewed: 04/20/2013 Elsevier Interactive Patient Education  2017 Elsevier Inc.   - Diabetes Mellitus and Food It is important for you to manage your blood sugar (glucose) level. Your blood glucose level can be greatly affected by what you eat. Eating healthier foods in the appropriate amounts throughout the day at about the same time each day will help you control your blood glucose level. It can also help slow or prevent worsening of your diabetes mellitus. Healthy eating may even help you improve the level of your blood pressure and reach or maintain a healthy weight. General recommendations for healthful eating and cooking habits include:  Eating meals and snacks regularly. Avoid going long periods of time without eating to lose weight.  Eating a diet that consists mainly of plant-based foods, such as fruits, vegetables, nuts, legumes, and whole grains.  Using low-heat cooking methods, such as baking, instead of high-heat cooking methods, such as deep frying. Work with your dietitian to make sure you understand how to use the Nutrition Facts information on food labels. How can food affect me? Carbohydrates  Carbohydrates affect your blood  glucose level more than any other type of food. Your dietitian will help you determine how many carbohydrates to eat at each meal and teach you how to count carbohydrates. Counting carbohydrates is important to keep your blood glucose at a healthy level, especially  if you are using insulin or taking certain medicines for diabetes mellitus. Alcohol  Alcohol can cause sudden decreases in blood glucose (hypoglycemia), especially if you use insulin or take certain medicines for diabetes mellitus. Hypoglycemia can be a life-threatening condition. Symptoms of hypoglycemia (sleepiness, dizziness, and disorientation) are similar to symptoms of having too much alcohol. If your health care provider has given you approval to drink alcohol, do so in moderation and use the following guidelines:  Women should not have more than one drink per day, and men should not have more than two drinks per day. One drink is equal to:  12 oz of beer.  5 oz of wine.  1 oz of hard liquor.  Do not drink on an empty stomach.  Keep yourself hydrated. Have water, diet soda, or unsweetened iced tea.  Regular soda, juice, and other mixers might contain a lot of carbohydrates and should be counted. What foods are not recommended? As you make food choices, it is important to remember that all foods are not the same. Some foods have fewer nutrients per serving than other foods, even though they might have the same number of calories or carbohydrates. It is difficult to get your body what it needs when you eat foods with fewer nutrients. Examples of foods that you should avoid that are high in calories and carbohydrates but low in nutrients include:  Trans fats (most processed foods list trans fats on the Nutrition Facts label).  Regular soda.  Juice.  Candy.  Sweets, such as cake, pie, doughnuts, and cookies.  Fried foods. What foods can I eat? Eat nutrient-rich foods, which will nourish your body and keep you healthy. The food you should eat also will depend on several factors, including:  The calories you need.  The medicines you take.  Your weight.  Your blood glucose level.  Your blood pressure level.  Your cholesterol level. You should eat a variety of foods,  including:  Protein.  Lean cuts of meat.  Proteins low in saturated fats, such as fish, egg whites, and beans. Avoid processed meats.  Fruits and vegetables.  Fruits and vegetables that may help control blood glucose levels, such as apples, mangoes, and yams.  Dairy products.  Choose fat-free or low-fat dairy products, such as milk, yogurt, and cheese.  Grains, bread, pasta, and rice.  Choose whole grain products, such as multigrain bread, whole oats, and brown rice. These foods may help control blood pressure.  Fats.  Foods containing healthful fats, such as nuts, avocado, olive oil, canola oil, and fish. Does everyone with diabetes mellitus have the same meal plan? Because every person with diabetes mellitus is different, there is not one meal plan that works for everyone. It is very important that you meet with a dietitian who will help you create a meal plan that is just right for you. This information is not intended to replace advice given to you by your health care provider. Make sure you discuss any questions you have with your health care provider. Document Released: 03/25/2005 Document Revised: 12/04/2015 Document Reviewed: 05/25/2013 Elsevier Interactive Patient Education  2017 Elsevier Inc.   -  Sleep Apnea Sleep apnea is a condition that affects breathing. People with sleep  apnea have moments during sleep when their breathing pauses briefly or gets shallow. Sleep apnea can cause these symptoms:  Trouble staying asleep.  Sleepiness or tiredness during the day.  Irritability.  Loud snoring.  Morning headaches.  Trouble concentrating.  Forgetting things.  Less interest in sex.  Being sleepy for no reason.  Mood swings.  Personality changes.  Depression.  Waking up a lot during the night to pee (urinate).  Dry mouth.  Sore throat. Follow these instructions at home:  Make any changes in your routine that your doctor recommends.  Eat a  healthy, well-balanced diet.  Take over-the-counter and prescription medicines only as told by your doctor.  Avoid using alcohol, calming medicines (sedatives), and narcotic medicines.  Take steps to lose weight if you are overweight.  If you were given a machine (device) to use while you sleep, use it only as told by your doctor.  Do not use any tobacco products, such as cigarettes, chewing tobacco, and e-cigarettes. If you need help quitting, ask your doctor.  Keep all follow-up visits as told by your doctor. This is important. Contact a doctor if:  The machine that you were given to use during sleep is uncomfortable or does not seem to be working.  Your symptoms do not get better.  Your symptoms get worse. Get help right away if:  Your chest hurts.  You have trouble breathing in enough air (shortness of breath).  You have an uncomfortable feeling in your back, arms, or stomach.  You have trouble talking.  One side of your body feels weak.  A part of your face is hanging down (drooping). These symptoms may be an emergency. Do not wait to see if the symptoms will go away. Get medical help right away. Call your local emergency services (911 in the [place] Do not drive yourself to the hospital.  This information is not intended to replace advice given to you by your health care provider. Make sure you discuss any questions you have with your health care provider. Document Released: 04/06/2008 Document Revised: 02/22/2016 Document Reviewed: 04/07/2015 Elsevier Interactive Patient Education  2017 [employer].

## 2016-05-25 NOTE — Progress Notes (Signed)
[name], is a 65 y.o. male  PIR:[id number]  YTK:[id number]  DOB - 30-May-1951  Chief Complaint  Patient presents with  . Rash        Subjective:   [name] is a 65 y.o. male here today for a follow up visit., last seen 8/44mw/ dm 2, chronic afib on Xarelto, osa (noncompliant w/ cpap),  htn and cad sp PCI to RCA in 2012.  Cath July 2015 showed non-obstructive disease. Morbid obese., bmi 50. Being followed by Endo for dm and cards for cad.    Pt c/o of pruritis lower extremity and dryness, scratches it a lot. Denies swelling.  Has not been using his cpap for osa, says cannot find it in his house.  Sleeps most times in recliner, but now has hospital bed so has easier time sleeping in bed as well.   Patient has No headache, No chest pain, No abdominal pain - No Nausea, No new weakness tingling or numbness, No Cough - SOB.  No problems updated.  ALLERGIES: No Known Allergies  PAST MEDICAL HISTORY: Past Medical History:  Diagnosis Date  . Arthritis    "hands and lower back" (09/19/2014)  . CAD (coronary artery disease)    a. s/p PCI to RCA in 2012. b. prior cath in 01/2014 with elevated L/RH pressures, mild-mod CAD of LAD/LCx with patent RCA, normal EF, c/b CIN/CHF.  .Marland KitchenCarpal tunnel syndrome, bilateral   . Cellulitis   . Chronic diastolic CHF (congestive heart failure) ([place]   . Chronic kidney disease (CKD), stage III (moderate)   . Chronic lower back pain   . History of blood transfusion ~ 1954   "related to OR"  . Hyperlipidemia   . Hypertension   . Hypoxia    a. Qualified for home O2 at DC in 09/2014.  .Marland KitchenLower GI bleed   . Microcytic anemia   . Morbid obesity ([place]   . Neuropathy ([place]   . OSA (obstructive sleep apnea)    "I wear nasal prongs; haven't been using prongs recently" (09/19/2014)  . PAF (paroxysmal atrial fibrillation) ([facility]    TEE DCCV 09/23/2014  . Physical deconditioning   . Pilonidal cyst 1980's; 01/25/2013  . Scrotal abscess   . Type II  diabetes mellitus ([place]     MEDICATIONS AT HOME: Prior to Admission medications   Medication Sig Start Date End Date Taking? Authorizing Provider  ACCU-CHEK AVIVA PLUS test strip  04/09/16  Yes Historical Provider, MD  atorvastatin (LIPITOR) 80 MG tablet Take 1 tablet (80 mg total) by mouth daily. 05/25/16  Yes DMaren Reamer MD  BD INSULIN SYRINGE ULTRAFINE 31G X 15/64" 0.3 ML MISC USE TO IJNECT INSULIN 3 TIMES DAILY. 03/08/16  Yes Historical Provider, MD  Blood Glucose Monitoring Suppl (ACCU-CHEK AVIVA PLUS) w/Device KIT Use to check blood sugar 6 times per day dx code E11.65 04/20/16  Yes AElayne Snare MD  furosemide (LASIX) 80 MG tablet Take 1 tablet (80 mg total) by mouth 2 (two) times daily. 05/25/16 08/23/16 Yes Nkechi Linehan TLazarus Gowda MD  gabapentin (NEURONTIN) 300 MG capsule Take 2 capsules (600 mg total) by mouth 2 (two) times daily. 05/25/16  Yes Sena Hoopingarner TLazarus Gowda MD  Insulin Glargine (TOUJEO SOLOSTAR) 300 UNIT/ML SOPN Inject 80 Units into the skin daily. 03/04/16  Yes AElayne Snare MD  Insulin Lispro (HUMALOG KWIKPEN) 200 UNIT/ML SOPN Inject 1 each into the skin 3 (three) times daily. Inject 80-100 units three times daily depending on meal size. 03/04/16  Yes Elayne Snare, MD  Multiple Vitamin (MULTIVITAMIN WITH MINERALS) TABS Take 1 tablet by mouth daily.   Yes Historical Provider, MD  oxyCODONE 20 MG TABS Take 1 tablet (20 mg total) by mouth every 6 (six) hours as needed for severe pain. 12/26/15  Yes Hillary Corinda Gubler, MD  spironolactone (ALDACTONE) 25 MG tablet Take 0.5 tablets (12.5 mg total) by mouth daily. 03/05/16  Yes Luke K Kilroy, PA-C  hydrocerin (EUCERIN) CREA Apply 1 application topically 3 (three) times daily. legs 05/25/16   [name], MD  Hypromellose (NATURAL BALANCE TEARS OP) Place 1 drop into both eyes daily as needed (for dry eyes).     Historical Provider, MD  KLOR-CON M20 20 MEQ tablet  02/02/16   Historical Provider, MD  lisinopril (PRINIVIL,ZESTRIL) 10 MG tablet   02/24/16   Historical Provider, MD  metoprolol (LOPRESSOR) 50 MG tablet Take 50 mg by mouth 2 (two) times daily. TWO TABLETS TWICE A DAY    Historical Provider, MD  mupirocin cream (BACTROBAN) 2 % Apply topically 2 (two) times daily. Patient not taking: Reported on 04/20/2016 12/26/15   Rogue Bussing, MD  rivaroxaban (XARELTO) 20 MG TABS tablet Take 1 tablet (20 mg total) by mouth daily with supper. 05/25/16   [name], MD     Objective:   Vitals:   05/25/16 1142  BP: (!) 147/74  Pulse: 92  Resp: (!) 24  Temp: 97.8 F (36.6 C)  TempSrc: Oral  SpO2: 93%  Weight: (!) 378 lb (171.5 kg)  Height: 6' (1.829 m)    Exam General appearance : Awake, alert, not in any distress. Speech Clear. Not toxic looking, morbid obese. HEENT: Atraumatic and Normocephalic, pupils equally reactive to light. Neck: supple, no JVD.  Chest:Good air entry bilaterally, no added sounds. CVS: S1 S2 regular, no murmurs/gallups or rubs. Abdomen: Bowel sounds active, obese, Non tender and not distended with no gaurding, rigidity or rebound. Extremities: B/L Lower Ext shows dry, scaly skin w/ chronic venous stasis changes, excoriations noted. No current signs of cellulitis/weeping wounds noted. Trace edema., both legs are warm to touch Neurology: Awake alert, and oriented X 3, CN II-XII grossly intact, Non focal   Data Review Lab Results  Component Value Date   HGBA1C 8.9 03/03/2016   HGBA1C 8.7 10/14/2015   HGBA1C 10.5 (H) 07/09/2015    Depression screen PHQ 2/9 05/25/2016 03/09/2016  Decreased Interest 3 0  Down, Depressed, Hopeless 2 0  PHQ - 2 Score 5 0  Altered sleeping 0 -  Tired, decreased energy 3 -  Change in appetite 0 -  Feeling bad or failure about yourself  0 -  Trouble concentrating 0 -  Moving slowly or fidgety/restless 0 -  Suicidal thoughts 0 -  PHQ-9 Score 8 -  Some recent data might be hidden      Assessment & Plan   1. 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  - BASIC METABOLIC PANEL WITH GFR - on statin therapy  2. Colon cancer screening Encouraged to get screened, w/ hx of anemia,  - Ambulatory referral to Gastroenterology  3. ckd 3 - rechk bmp today, acei currently on hold per pt.  4. Htn, w/ chronic diastolic chf - bp elevated, goal sbp <130/80, if renal fn improved, consider restarting ACEI. - on lasix and spironolactone,  - on metoprolol   5. Osa, not using cpap, w/ chronic resp failure, on home o2 -  encouraged find his nasal cpap and use w/ o2,, dw pt consequences of untreated osa w/ his other comorbidities.  6. Chronic afib On xarelto,    7. Venous stasis syndrome w/ dry excema - renewed eucirin cream, discussed emoliants such as petroleum jelly at night as well. - no active signs of infection noted  Patient have been counseled extensively about nutrition and exercise  Return in about 3 months (around 08/25/2016), or if symptoms worsen or fail to improve.  The patient was given clear instructions to go to ER or return to medical center if symptoms don't improve, worsen or new problems develop. The patient verbalized understanding. The patient was told to call to get lab results if they haven't heard anything in the next week.   This note has been created with Surveyor, quantity. Any transcriptional errors are unintentional.   [name], MD, [place] and [facility] [place], [place]   05/25/2016, 12:15 PM

## 2016-05-31 ENCOUNTER — Ambulatory Visit: Payer: Medicare Other | Admitting: Cardiology

## 2016-06-01 ENCOUNTER — Ambulatory Visit: Payer: Medicare Other | Admitting: Cardiology

## 2016-06-09 ENCOUNTER — Encounter (HOSPITAL_COMMUNITY): Payer: Self-pay | Admitting: *Deleted

## 2016-06-09 ENCOUNTER — Inpatient Hospital Stay (HOSPITAL_COMMUNITY)
Admission: EM | Admit: 2016-06-09 | Discharge: 2016-06-17 | DRG: 291 | Disposition: A | Discharge status: Home Health | Payer: Medicare Other | Attending: Internal Medicine | Admitting: Internal Medicine

## 2016-06-09 ENCOUNTER — Encounter: Payer: Self-pay | Admitting: Internal Medicine

## 2016-06-09 ENCOUNTER — Emergency Department (HOSPITAL_COMMUNITY): Payer: Medicare Other

## 2016-06-09 DIAGNOSIS — I481 Persistent atrial fibrillation: Secondary | ICD-10-CM | POA: Diagnosis present

## 2016-06-09 DIAGNOSIS — Z9861 Coronary angioplasty status: Secondary | ICD-10-CM

## 2016-06-09 DIAGNOSIS — Z7901 Long term (current) use of anticoagulants: Secondary | ICD-10-CM | POA: Diagnosis not present

## 2016-06-09 DIAGNOSIS — T501X5A Adverse effect of loop [high-ceiling] diuretics, initial encounter: Secondary | ICD-10-CM | POA: Diagnosis present

## 2016-06-09 DIAGNOSIS — E1169 Type 2 diabetes mellitus with other specified complication: Secondary | ICD-10-CM

## 2016-06-09 DIAGNOSIS — I10 Essential (primary) hypertension: Secondary | ICD-10-CM | POA: Diagnosis present

## 2016-06-09 DIAGNOSIS — Z9119 Patient's noncompliance with other medical treatment and regimen: Secondary | ICD-10-CM

## 2016-06-09 DIAGNOSIS — Z955 Presence of coronary angioplasty implant and graft: Secondary | ICD-10-CM

## 2016-06-09 DIAGNOSIS — I130 Hypertensive heart and chronic kidney disease with heart failure and stage 1 through stage 4 chronic kidney disease, or unspecified chronic kidney disease: Secondary | ICD-10-CM | POA: Diagnosis not present

## 2016-06-09 DIAGNOSIS — E785 Hyperlipidemia, unspecified: Secondary | ICD-10-CM

## 2016-06-09 DIAGNOSIS — Y929 Unspecified place or not applicable: Secondary | ICD-10-CM

## 2016-06-09 DIAGNOSIS — I2510 Atherosclerotic heart disease of native coronary artery without angina pectoris: Secondary | ICD-10-CM | POA: Diagnosis present

## 2016-06-09 DIAGNOSIS — Z79899 Other long term (current) drug therapy: Secondary | ICD-10-CM

## 2016-06-09 DIAGNOSIS — N183 Chronic kidney disease, stage 3 unspecified: Secondary | ICD-10-CM | POA: Diagnosis present

## 2016-06-09 DIAGNOSIS — Z794 Long term (current) use of insulin: Secondary | ICD-10-CM

## 2016-06-09 DIAGNOSIS — E669 Obesity, unspecified: Secondary | ICD-10-CM

## 2016-06-09 DIAGNOSIS — R0602 Shortness of breath: Secondary | ICD-10-CM

## 2016-06-09 DIAGNOSIS — I482 Chronic atrial fibrillation: Secondary | ICD-10-CM | POA: Diagnosis not present

## 2016-06-09 DIAGNOSIS — I4891 Unspecified atrial fibrillation: Secondary | ICD-10-CM | POA: Diagnosis present

## 2016-06-09 DIAGNOSIS — M545 Low back pain: Secondary | ICD-10-CM | POA: Diagnosis present

## 2016-06-09 DIAGNOSIS — I509 Heart failure, unspecified: Secondary | ICD-10-CM

## 2016-06-09 DIAGNOSIS — L03116 Cellulitis of left lower limb: Secondary | ICD-10-CM | POA: Diagnosis present

## 2016-06-09 DIAGNOSIS — E876 Hypokalemia: Secondary | ICD-10-CM

## 2016-06-09 DIAGNOSIS — G8929 Other chronic pain: Secondary | ICD-10-CM | POA: Diagnosis present

## 2016-06-09 DIAGNOSIS — E872 Acidosis, unspecified: Secondary | ICD-10-CM | POA: Diagnosis present

## 2016-06-09 DIAGNOSIS — R0902 Hypoxemia: Secondary | ICD-10-CM | POA: Diagnosis present

## 2016-06-09 DIAGNOSIS — I5033 Acute on chronic diastolic (congestive) heart failure: Secondary | ICD-10-CM | POA: Diagnosis not present

## 2016-06-09 DIAGNOSIS — Z87891 Personal history of nicotine dependence: Secondary | ICD-10-CM

## 2016-06-09 DIAGNOSIS — E871 Hypo-osmolality and hyponatremia: Secondary | ICD-10-CM | POA: Diagnosis not present

## 2016-06-09 DIAGNOSIS — G4733 Obstructive sleep apnea (adult) (pediatric): Secondary | ICD-10-CM

## 2016-06-09 DIAGNOSIS — L03115 Cellulitis of right lower limb: Secondary | ICD-10-CM | POA: Diagnosis present

## 2016-06-09 DIAGNOSIS — N179 Acute kidney failure, unspecified: Secondary | ICD-10-CM

## 2016-06-09 DIAGNOSIS — N141 Nephropathy induced by other drugs, medicaments and biological substances: Secondary | ICD-10-CM | POA: Diagnosis present

## 2016-06-09 DIAGNOSIS — R609 Edema, unspecified: Secondary | ICD-10-CM

## 2016-06-09 DIAGNOSIS — E1122 Type 2 diabetes mellitus with diabetic chronic kidney disease: Secondary | ICD-10-CM | POA: Diagnosis present

## 2016-06-09 DIAGNOSIS — Z6841 Body Mass Index (BMI) 40.0 and over, adult: Secondary | ICD-10-CM

## 2016-06-09 DIAGNOSIS — I5032 Chronic diastolic (congestive) heart failure: Secondary | ICD-10-CM | POA: Diagnosis present

## 2016-06-09 LAB — BASIC METABOLIC PANEL
Anion gap: 11 (ref 5–15)
BUN: 9 mg/dL (ref 6–20)
CHLORIDE: 100 mmol/L — AB (ref 101–111)
CO2: 28 mmol/L (ref 22–32)
CREATININE: 1.37 mg/dL — AB (ref 0.61–1.24)
Calcium: 8.8 mg/dL — ABNORMAL LOW (ref 8.9–10.3)
GFR calc Af Amer: >60 mL/min (ref >60)
GFR calc non Af Amer: 53 mL/min — ABNORMAL LOW (ref >60)
Glucose, Bld: 148 mg/dL — ABNORMAL HIGH (ref 65–99)
POTASSIUM: 3 mmol/L — AB (ref 3.5–5.1)
SODIUM: 139 mmol/L (ref 135–145)

## 2016-06-09 LAB — I-STAT TROPONIN, ED: Troponin i, poc: 0 ng/mL (ref 0.00–0.08)

## 2016-06-09 LAB — CBC
HEMATOCRIT: 34.8 % — AB (ref 39.0–52.0)
Hemoglobin: 10.6 g/dL — ABNORMAL LOW (ref 13.0–17.0)
MCH: 23.7 pg — AB (ref 26.0–34.0)
MCHC: 30.5 g/dL (ref 30.0–36.0)
MCV: 77.7 fL — AB (ref 78.0–100.0)
Platelets: 319 10*3/uL (ref 150–400)
RBC: 4.48 MIL/uL (ref 4.22–5.81)
RDW: 17.9 % — ABNORMAL HIGH (ref 11.5–15.5)
WBC: 13.8 10*3/uL — AB (ref 4.0–10.5)

## 2016-06-09 LAB — I-STAT CG4 LACTIC ACID, ED: Lactic Acid, Venous: 2.56 mmol/L (ref 0.5–1.9)

## 2016-06-09 LAB — BRAIN NATRIURETIC PEPTIDE: B Natriuretic Peptide: 146.1 pg/mL — ABNORMAL HIGH (ref 0.0–100.0)

## 2016-06-09 MED ORDER — FENTANYL CITRATE (PF) 100 MCG/2ML IJ SOLN
50.0000 ug | Freq: Once | INTRAMUSCULAR | Status: AC
  Administered 2016-06-09: 50 ug via INTRAVENOUS
  Filled 2016-06-09: qty 2

## 2016-06-09 MED ORDER — POTASSIUM CHLORIDE CRYS ER 20 MEQ PO TBCR
40.0000 meq | EXTENDED_RELEASE_TABLET | Freq: Once | ORAL | Status: AC
  Administered 2016-06-09: 40 meq via ORAL
  Filled 2016-06-09: qty 2

## 2016-06-09 NOTE — H&P (Signed)
[name] PQZ:[id number] DOB: September 04, 1950 DOA: 06/09/2016     PCP: [name], MD   Outpatient Specialists: [facility]  Patient coming from:  home Lives alone,      Chief Complaint: Shortness of breath and worsening edema  HPI: [name] is a 65 y.o. male with medical history significant of DM 2, chronic afib on Xarelto, OSA (noncompliant w/ cpap), HTN  and CAD sp PCI to RCA in 2012. Orbit obesity  Chronic diastolic CHF, chronic low back pain chronic hypoxia on oxygen, CK D stage III Venous stasis  Presented with worsening shortness of breath for the past few days they have increased leg swelling with weeping swelling bilaterally overall fluid overload and upper extremity swelling Reports 20 lb weight gain in the past 2 week, urine has been dark, less urine output no bloodin urine no frothy urine   Regarding pertinent Chronic problems: Cardiac catheterization July 2015 showed nonobstructive disease He has history of sleep apnea but has not been compliant with his C Pap states can't find it he seems in a recliner  IN ER:  Temp (24hrs), Avg:98.4 F (36.9 C), Min:97.8 F (36.6 C), Max:98.9 F (37.2 C)      98% HR 93 BP 145/76 Lactic acid 2.56 which has been elevated in the past in February BNP 146.1 which is up from baseline of 132 Troponin 0 Lahey 1.37 which is down from August 1 0.77 but up from 2 weeks ago which went was 1.16 WBC 13.8 chronically elevated hemoglobin 10.6 which is chronic and unchanged Chest x-ray showing cardiomegaly with mild pulmonary vascular congestion  Following Medications were ordered in ER: Medications  potassium chloride SA (K-DUR,KLOR-CON) CR tablet 40 mEq (40 mEq Oral Given 06/09/16 2226)  fentaNYL (SUBLIMAZE) injection 50 mcg (50 mcg Intravenous Given 06/09/16 2226)      Hospitalist was called for admission forDiastolic CHF exacerbation  Review of Systems:    Pertinent positives include: shortness of breath at  rest, dyspnea on exertion,Bilateral lower extremity swelling    Constitutional:  No weight loss, night sweats, Fevers, chills, fatigue, weight loss  HEENT:  No headaches, Difficulty swallowing,Tooth/dental problems,Sore throat,  No sneezing, itching, ear ache, nasal congestion, post nasal drip,  Cardio-vascular:  No chest pain, Orthopnea, PND, anasarca, dizziness, palpitations GI:  No heartburn, indigestion, abdominal pain, nausea, vomiting, diarrhea, change in bowel habits, loss of appetite, melena, blood in stool, hematemesis Resp:  no . No  No excess mucus, no productive cough, No non-productive cough, No coughing up of blood.No change in color of mucus.No wheezing. Skin:  no rash or lesions. No jaundice GU:  no dysuria, change in color of urine, no urgency or frequency. No straining to urinate.  No flank pain.  Musculoskeletal:  No joint pain or no joint swelling. No decreased range of motion. No back pain.  Psych:  No change in mood or affect. No depression or anxiety. No memory loss.  Neuro: no localizing neurological complaints, no tingling, no weakness, no double vision, no gait abnormality, no slurred speech, no confusion  As per HPI otherwise 10 point review of systems negative.   Past Medical History: Past Medical History:  Diagnosis Date  . Arthritis    "hands and lower back" (09/19/2014)  . CAD (coronary artery disease)    a. s/p PCI to RCA in 2012. b. prior cath in 01/2014 with elevated L/RH pressures, mild-mod CAD of LAD/LCx with patent RCA, normal EF, c/b CIN/CHF.  [name]  Carpal tunnel syndrome, bilateral   . Cellulitis   . Chronic diastolic CHF (congestive heart failure) ([place])   . Chronic kidney disease (CKD), stage III (moderate)   . Chronic lower back pain   . History of blood transfusion ~ 1954   "related to OR"  . Hyperlipidemia   . Hypertension   . Hypoxia    a. Qualified for home O2 at DC in 09/2014.  [name] Lower GI bleed   . Microcytic anemia   . Morbid  obesity ([place])   . Neuropathy ([place])   . OSA (obstructive sleep apnea)    "I wear nasal prongs; haven't been using prongs recently" (09/19/2014)  . PAF (paroxysmal atrial fibrillation) ([place])    TEE DCCV 09/23/2014  . Physical deconditioning   . Pilonidal cyst 1980's; 01/25/2013  . Scrotal abscess   . Type II diabetes mellitus ([place])    Past Surgical History:  Procedure Laterality Date  . ABDOMINAL SURGERY  ~ 1954   BENIGN TUMOR REMOVED  . APPENDECTOMY    . CARDIOVERSION  2010   Archie Endo 09/19/2014  . CARDIOVERSION N/A 09/23/2014   Procedure: CARDIOVERSION;  Surgeon: Lelon Perla, MD;  Location: [facility] ENDOSCOPY;  Service: Cardiovascular;  Laterality: N/A;  . CARDIOVERSION N/A 12/11/2014   Procedure: CARDIOVERSION;  Surgeon: Sanda Klein, MD;  Location: [place] ENDOSCOPY;  Service: Cardiovascular;  Laterality: N/A;  . CATARACT EXTRACTION W/PHACO Right 11/15/2012   Procedure: CATARACT EXTRACTION PHACO AND INTRAOCULAR LENS PLACEMENT ([place]);  Surgeon: Adonis Brook, MD;  Location: [place];  Service: Ophthalmology;  Laterality: Right;  . CATARACT EXTRACTION W/PHACO Left 11/29/2012   Procedure: CATARACT EXTRACTION PHACO AND INTRAOCULAR LENS PLACEMENT (IOC);  Surgeon: Adonis Brook, MD;  Location: [place];  Service: Ophthalmology;  Laterality: Left;  . CORONARY ANGIOPLASTY WITH STENT PLACEMENT  August 2012   RCA DES - [facility]  . DEBRIDEMENT  FOOT Left    debriding diabetic foot ulcers  . EYE SURGERY    . FOREIGN BODY REMOVAL Right 2014   heel,  splinter removal   . LEFT AND RIGHT HEART CATHETERIZATION WITH CORONARY ANGIOGRAM N/A 01/31/2014   Procedure: LEFT AND RIGHT HEART CATHETERIZATION WITH CORONARY ANGIOGRAM;  Surgeon: Blane Ohara, MD;  [facility] C[facility] LAB;  Service: Cardiovascular;  Laterality: N/A;  . PARS PLANA VITRECTOMY Left 06/05/2013   Procedure: PARS PLANA VITRECTOMY WITH 23 GAUGE with Endolaser(constellation);  Surgeon: Adonis Brook, MD;  Location: [place];   Service: Ophthalmology;  Laterality: Left;  . PILONIDAL CYST EXCISION N/A 01/08/2013   Procedure: CYST EXCISION PILONIDAL EXTENSIVE;  Surgeon: Ralene Ok, MD;  Location: [place];  Service: General;  Laterality: N/A;  . PILONIDAL CYST EXCISION  1980's   "in [place]"  . TEE WITHOUT CARDIOVERSION N/A 09/23/2014   Procedure: TRANSESOPHAGEAL ECHOCARDIOGRAM (TEE);  Surgeon: Lelon Perla, MD;  Location: [facility] ENDOSCOPY;  Service: Cardiovascular;  Laterality: N/A;  . TONSILLECTOMY       Social History:  Ambulatory  walker       reports that he quit smoking about 32 years ago. His smoking use included Cigarettes. He has a 20.00 pack-year smoking history. He has never used smokeless tobacco. He reports that he does not drink alcohol or use drugs.  Allergies:  No Known Allergies     Family History:   Family History  Problem Relation Age of Onset  . Adopted: Yes  . Other Other     PT ADOPTED    Medications: Prior to Admission medications  Medication Sig Start Date End Date Taking? Authorizing Provider  atorvastatin (LIPITOR) 80 MG tablet Take 1 tablet (80 mg total) by mouth daily. 05/25/16  Yes [name], MD  furosemide (LASIX) 80 MG tablet Take 1 tablet (80 mg total) by mouth 2 (two) times daily. 05/25/16 08/23/16 Yes Dawn Lazarus Gowda, MD  gabapentin (NEURONTIN) 300 MG capsule Take 2 capsules (600 mg total) by mouth 2 (two) times daily. 05/25/16  Yes Dawn Lazarus Gowda, MD  Hypromellose (NATURAL BALANCE TEARS OP) Place 1 drop into both eyes daily as needed (for dry eyes).    Yes Historical Provider, MD  Insulin Glargine (TOUJEO SOLOSTAR) 300 UNIT/ML SOPN Inject 80 Units into the skin daily. 03/04/16  Yes Elayne Snare, MD  Insulin Lispro (HUMALOG KWIKPEN) 200 UNIT/ML SOPN Inject 1 each into the skin 3 (three) times daily. Inject 80-100 units three times daily depending on meal size. 03/04/16  Yes Elayne Snare, MD  metoprolol (LOPRESSOR) 50 MG tablet Take 50 mg by mouth 2 (two) times  daily. TWO TABLETS TWICE A DAY   Yes Historical Provider, MD  Multiple Vitamin (MULTIVITAMIN WITH MINERALS) TABS Take 1 tablet by mouth daily.   Yes Historical Provider, MD  oxyCODONE 20 MG TABS Take 1 tablet (20 mg total) by mouth every 6 (six) hours as needed for severe pain. Patient taking differently: Take 20-30 mg by mouth every 6 (six) hours as needed for severe pain.  12/26/15  Yes Hillary Corinda Gubler, MD  rivaroxaban (XARELTO) 20 MG TABS tablet Take 1 tablet (20 mg total) by mouth daily with supper. 05/25/16  Yes [name], MD  spironolactone (ALDACTONE) 25 MG tablet Take 0.5 tablets (12.5 mg total) by mouth daily. 03/05/16  Yes Luke K Kilroy, PA-C  BD INSULIN SYRINGE ULTRAFINE 31G X 15/64" 0.3 ML MISC USE TO IJNECT INSULIN 3 TIMES DAILY. 03/08/16   Historical Provider, MD  Blood Glucose Monitoring Suppl (ACCU-CHEK AVIVA PLUS) w/Device KIT Use to check blood sugar 6 times per day dx code E11.65 04/20/16   Elayne Snare, MD  hydrocerin (EUCERIN) CREA Apply 1 application topically 3 (three) times daily. legs Patient not taking: Reported on 06/09/2016 05/25/16   [name], MD  KLOR-CON M20 20 MEQ tablet  02/02/16   Historical Provider, MD  lisinopril (PRINIVIL,ZESTRIL) 10 MG tablet  02/24/16   Historical Provider, MD  mupirocin cream (BACTROBAN) 2 % Apply topically 2 (two) times daily. Patient not taking: Reported on 06/09/2016 12/26/15   Rogue Bussing, MD    Physical Exam: Patient Vitals for the past 24 hrs:  BP Temp Temp src Pulse Resp SpO2 Weight  06/09/16 2100 145/76 - - 93 14 98 % -  06/09/16 2040 141/83 - - 110 20 98 % -  06/09/16 1944 127/57 98.9 F (37.2 C) Oral 95 18 100 % -  06/09/16 1730 139/77 97.8 F (36.6 C) Oral 100 17 99 % -  06/09/16 1512 - - - - - - (!) 173 kg (381 lb 8 oz)  06/09/16 1457 152/67 98.6 F (37 C) Oral 108 22 99 % -    1. General:  in No Acute distress 2. Psychological: Alert and  Oriented 3. Head/ENT:   Moist  Mucous Membranes                           Head Non traumatic, neck supple  Poor Dentition 4. SKIN: normal  Skin turgor,  Skin clean Dry and intact no rash 5. Heart: Regular rate and rhythm no  Murmur, Rub or gallop 6. Lungs:   no wheezes some crackles   7. Abdomen: Soft,  non-tender, Non distended obese 8. Lower extremities: no clubbing, cyanosis, 2+ whipping edema 9. Neurologically Grossly intact, moving all 4 extremities equally  10. MSK: Normal range of motion   body mass index is 51.74 kg/m.  Labs on Admission:   Labs on Admission: I have personally reviewed following labs and imaging studies  CBC:  Recent Labs Lab 06/09/16 1501  WBC 13.8*  HGB 10.6*  HCT 34.8*  MCV 77.7*  PLT 683   Basic Metabolic Panel:  Recent Labs Lab 06/09/16 1501  NA 139  K 3.0*  CL 100*  CO2 28  GLUCOSE 148*  BUN 9  CREATININE 1.37*  CALCIUM 8.8*   GFR: Estimated Creatinine Clearance: 89.2 mL/min (by C-G formula based on SCr of 1.37 mg/dL (H)). Liver Function Tests: No results for input(s): AST, ALT, ALKPHOS, BILITOT, PROT, ALBUMIN in the last 168 hours. No results for input(s): LIPASE, AMYLASE in the last 168 hours. No results for input(s): AMMONIA in the last 168 hours. Coagulation Profile: No results for input(s): INR, PROTIME in the last 168 hours. Cardiac Enzymes: No results for input(s): CKTOTAL, CKMB, CKMBINDEX, TROPONINI in the last 168 hours. BNP (last 3 results) No results for input(s): PROBNP in the last 8760 hours. HbA1C: No results for input(s): HGBA1C in the last 72 hours. CBG: No results for input(s): GLUCAP in the last 168 hours. Lipid Profile: No results for input(s): CHOL, HDL, LDLCALC, TRIG, CHOLHDL, LDLDIRECT in the last 72 hours. Thyroid Function Tests: No results for input(s): TSH, T4TOTAL, FREET4, T3FREE, THYROIDAB in the last 72 hours. Anemia Panel: No results for input(s): VITAMINB12, FOLATE, FERRITIN, TIBC, IRON, RETICCTPCT in the last 72  hours.  Sepsis Labs: _0 (procalcitonin:4,lacticidven:4) )No results found for this or any previous visit (from the past 240 hour(s)).    UA not ordered  Lab Results  Component Value Date   HGBA1C 8.9 03/03/2016    Estimated Creatinine Clearance: 89.2 mL/min (by C-G formula based on SCr of 1.37 mg/dL (H)).  BNP (last 3 results) No results for input(s): PROBNP in the last 8760 hours.   ECG REPORT  Independently reviewed Rate:109  Rhythm: A.fib ST&T Change: No acute ischemic changes   QTC 382  Filed Weights   06/09/16 1512  Weight: (!) 173 kg (381 lb 8 oz)     Cultures:    Component Value Date/Time   SDES SCROTUM 12/27/2015 1820   SPECREQUEST NONE 12/27/2015 1820   CULT  12/27/2015 1820    NORMAL SKIN FLORA MIXED ANAEROBIC FLORA PRESENT.  CALL LAB IF FURTHER IID REQUIRED.    REPTSTATUS 01/02/2016 FINAL 12/27/2015 1820     Radiological Exams on Admission: Dg Chest 2 View  Result Date: 06/09/2016 CLINICAL DATA:  Shortness of breath, leg swelling for 1 week EXAM: CHEST  2 VIEW COMPARISON:  Chest x-ray of 07/08/2015 FINDINGS: The patient could not stand and the patient's chin overlies the upper lung feels obscuring detail. However on the best images possible, no pneumonia is seen. No effusion is noted. Mediastinal and hilar contours are unremarkable. Mild pulmonary vascular congestion cannot be excluded. The heart is mildly enlarged. No bony abnormality is seen. IMPRESSION: 1. Cardiomegaly.  Cannot exclude mild pulmonary vascular congestion. 2. No pneumonia or effusion. Electronically Signed   By:  Ivar Drape M.D.   On: 06/09/2016 16:10    Chart has been reviewed    Assessment/Plan  65 y.o. male with medical history significant of DM 2, chronic afib on Xarelto, OSA (noncompliant w/ cpap), HTN  and CAD sp PCI to RCA in 2012. Orbit obesity  Chronic diastolic CHF, chronic low back pain chronic hypoxia on oxygen, CK D stage III Venous stasis being admitted for  acute on chronic   diastolic CHF  Present on Admission: . Acute on chronic diastolic heart failure ([place]) - admit on telemetry, cycle cardiac enzymes, obtain serial ECG, to evaluate for ischemia as a cause of heart failure  monitor daily weight  diurese with IV lasix and monitor orthostatics and creatinine to avoid over diuresis.  Order echogram to evaluate EF and valves Not on ACE/ARBi due to renal insuficiency   Consider cardiology consult if no improvement   . Atrial fibrillation - chronic will restart metoprolol monitor on tele, continue Xarelto CHA2DS2 vas score 4   . CKD (chronic kidney disease) stage 3, GFR 30-59 ml/min - Cr slightly up from previous given fluid overload will diurese. Obtain renal US and urine electrolytes . Diabetes mellitus type 2 in obese (HCC) order SSI  . Essential hypertension, benign - continue home medication  . Hyperlipidemia - continue home medication . HYPOKALEMIA - replace and monitor . Obstructive sleep apnea- unable to tolerate c-pap, will hold off . Lactic acidosis -  reccurent no evidence of infectious etiology at this point. wil follow   Other plan as per orders.  DVT prophylaxis:  Xarelto  Code Status:  FULL CODE as per patient    Family Communication:   Family not  at  Bedside    Disposition Plan:   To home once workup is complete and patient is stable                         Would benefit from PT/OT eval prior to DC   ordered                                                Consults called: none   Admission status: inpatient      Level of care     tele            I have spent a total of 56 min on this admission    Kurt Hoffmeier 06/10/2016, 1:13 AM    [name]  Pager [phone]   after 2 AM please page floor coverage PA If 7AM-7PM, please contact the day team taking care of the patient  [email or web address]  Password [email or web address]

## 2016-06-09 NOTE — ED Triage Notes (Signed)
Pt reports having sob and leg swelling, drainage x 1 week. Hx of chf.

## 2016-06-09 NOTE — ED Provider Notes (Signed)
[facility] DEPT Provider Note   CSN: [id number] Arrival date & time: 06/09/16  1429     History   Chief Complaint Chief Complaint  Patient presents with  . Shortness of Breath  . Leg Swelling    HPI [name] is a 65 y.o. male with past medical history significant for coronary artery disease status post PCI, CHF, CKD, prior GI bleed, A. fib on is a relative, and B's who presents with worsening shortness of breath, generalized edema, and cough. Patient reports that he is concerned he has "too much fluid onboard". Patient said that he takes oral Lasix and has had to be admitted for IV Lasix in the past for CHF exacerbation. He says that the swelling has been symmetric in his large recent upper 70s. He said his legs are painful, feel heavy, and are weeping. He describes it as an aching pain. He denies history of DVT or PE. He is on the relative for the A. fib. Patient denies nausea, vomiting, conservation, diarrhea. He does report some darker urine than normal. He reports cough with a clear sputum. He denies any chest pain. Patient says the shortness breath is worsened when he walks and he is having difficulty with regulation due to the shortness of breath worsening. He is concerned about going home given the shortness breath.  The history is provided by the patient and medical records. No language interpreter was used.  Shortness of Breath  This is a recurrent problem. The average episode lasts 1 week. The problem occurs continuously.The problem has been gradually worsening. Associated symptoms include cough, sputum production, leg pain and leg swelling. Pertinent negatives include no fever, no headaches, no rhinorrhea, no neck pain, no wheezing, no chest pain, no vomiting, no abdominal pain and no rash. He has tried nothing for the symptoms.    Past Medical History:  Diagnosis Date  . Arthritis    "hands and lower back" (09/19/2014)  . CAD (coronary artery disease)    a. s/p PCI  to RCA in 2012. b. prior cath in 01/2014 with elevated L/RH pressures, mild-mod CAD of LAD/LCx with patent RCA, normal EF, c/b CIN/CHF.  [name] Carpal tunnel syndrome, bilateral   . Cellulitis   . Chronic diastolic CHF (congestive heart failure) ([place])   . Chronic kidney disease (CKD), stage III (moderate)   . Chronic lower back pain   . History of blood transfusion ~ 1954   "related to OR"  . Hyperlipidemia   . Hypertension   . Hypoxia    a. Qualified for home O2 at [facility] in 09/2014.  [name] Lower GI bleed   . Microcytic anemia   . Morbid obesity ([place])   . Neuropathy ([place])   . OSA (obstructive sleep apnea)    "I wear nasal prongs; haven't been using prongs recently" (09/19/2014)  . PAF (paroxysmal atrial fibrillation) ([place])    TEE DCCV 09/23/2014  . Physical deconditioning   . Pilonidal cyst 1980's; 01/25/2013  . Scrotal abscess   . Type II diabetes mellitus [facility])     Patient Active Problem List   Diagnosis Date Noted  . Syncope and collapse 03/05/2016  . Acute on chronic renal insufficiency 03/05/2016  . Obesity 12/30/2015  . Scrotal swelling   . Foot fracture   . 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 Lower GI bleed   . Pressure ulcer 12/24/2015  . Rhabdomyolysis   . Acute kidney injury ([place]) 12/23/2015  . Back pain 12/23/2015  .  Fall   . Metatarsal fracture   . Traumatic rhabdomyolysis ([place])   . Hyperkalemia   . Diabetes mellitus type 2 in obese ([place])   . Lisfranc dislocation   . Hyperglycemia   . Sepsis ([place]) 09/08/2015  . Scrotal wall abscess 09/08/2015  . AKI (acute kidney injury) ([place]) 09/08/2015  . Chest wall pain   . On amiodarone therapy 12/08/2014  . Respiratory failure with hypoxia ([place]) 12/04/2014  . Hypoxia 12/04/2014  . Acute on chronic respiratory failure with hypoxia ([place])   . Atrial fibrillation with RVR ([place]) 09/20/2014  . Acute on chronic diastolic heart failure ([place]) 09/19/2014  . Atrial fibrillation with rapid ventricular response ([place])  09/19/2014  . Acute respiratory failure ([place]) 09/19/2014  . Abnormal nuclear cardiac imaging test 02/12/2014  . Dyspnea 02/12/2014  . Chronic anticoagulation- Coumadin 02/12/2014  . CKD (chronic kidney disease) stage 3, GFR 30-59 ml/min 02/12/2014  . Chronic diastolic congestive heart failure ([place]) 02/11/2014  . Encounter for therapeutic drug monitoring 08/03/2013  . Pilonidal cyst 01/25/2013  . Perirectal cellulitis 09/06/2012  . Long term (current) use of anticoagulants 11/18/2011  . Obstructive sleep apnea- unable to tolerate c-pap 08/04/2011  . CAD S/P percutaneous coronary angioplasty 08/04/2011  . Diabetes mellitus type 2, uncontrolled, with complications ([place]) 55/97/4163  . ANEMIA-IRON DEFICIENCY 01/06/2009  . OTHER AND UNSPECIFIED COAGULATION DEFECTS 01/06/2009  . RECTAL BLEEDING 01/06/2009  . THYROID STIMULATING HORMONE, ABNORMAL 01/02/2009  . ANEMIA 01/02/2009  . CARDIOVASCULAR FUNCTION STUDY, ABNORMAL 01/02/2009  . Essential hypertension, benign 10/22/2008  . Atrial fibrillation 10/22/2008  . DIAB W/UNSPEC COMP TYPE II/UNSPEC TYPE UNCNTRL 09/23/2008  . Hyperlipidemia 09/23/2008  . HYPOKALEMIA 09/23/2008  . OBESITY-MORBID BMI 54 09/23/2008  . CELLULITIS AND ABSCESS OF LEG EXCEPT FOOT 09/23/2008    Past Surgical History:  Procedure Laterality Date  . ABDOMINAL SURGERY  ~ 1954   BENIGN TUMOR REMOVED  . APPENDECTOMY    . CARDIOVERSION  2010   Archie Endo 09/19/2014  . CARDIOVERSION N/A 09/23/2014   Procedure: CARDIOVERSION;  Surgeon: Lelon Perla, MD;  Location: [facility] ENDOSCOPY;  Service: Cardiovascular;  Laterality: N/A;  . CARDIOVERSION N/A 12/11/2014   Procedure: CARDIOVERSION;  Surgeon: Sanda Klein, MD;  Location: [facility] ENDOSCOPY;  Service: Cardiovascular;  Laterality: N/A;  . CATARACT EXTRACTION W/PHACO Right 11/15/2012   Procedure: CATARACT EXTRACTION PHACO AND INTRAOCULAR LENS PLACEMENT ([place]);  Surgeon: Adonis Brook, MD;  Location: [facility];  Service: Ophthalmology;   Laterality: Right;  . CATARACT EXTRACTION W/PHACO Left 11/29/2012   Procedure: CATARACT EXTRACTION PHACO AND INTRAOCULAR LENS PLACEMENT (IOC);  Surgeon: Adonis Brook, MD;  Location: [facility];  Service: Ophthalmology;  Laterality: Left;  . CORONARY ANGIOPLASTY WITH STENT PLACEMENT  August 2012   RCA DES - [facility]  . DEBRIDEMENT  FOOT Left    debriding diabetic foot ulcers  . EYE SURGERY    . FOREIGN BODY REMOVAL Right 2014   heel,  splinter removal   . LEFT AND RIGHT HEART CATHETERIZATION WITH CORONARY ANGIOGRAM N/A 01/31/2014   Procedure: LEFT AND RIGHT HEART CATHETERIZATION WITH CORONARY ANGIOGRAM;  Surgeon: Blane Ohara, MD;  Location: [facility] CATH LAB;  Service: Cardiovascular;  Laterality: N/A;  . PARS PLANA VITRECTOMY Left 06/05/2013   Procedure: PARS PLANA VITRECTOMY WITH 23 GAUGE with Endolaser(constellation);  Surgeon: Adonis Brook, MD;  Location: [facility];  Service: Ophthalmology;  Laterality: Left;  . PILONIDAL CYST EXCISION N/A 01/08/2013   Procedure: CYST EXCISION PILONIDAL EXTENSIVE;  Surgeon: Ralene Ok, MD;  Location: [facility] OR;  Service: General;  Laterality: N/A;  . PILONIDAL CYST EXCISION  1980's   "in [place]"  . TEE WITHOUT CARDIOVERSION N/A 09/23/2014   Procedure: TRANSESOPHAGEAL ECHOCARDIOGRAM (TEE);  Surgeon: Lelon Perla, MD;  Location: [facility] ENDOSCOPY;  Service: Cardiovascular;  Laterality: N/A;  . TONSILLECTOMY         Home Medications    Prior to Admission medications   Medication Sig Start Date End Date Taking? Authorizing Provider  atorvastatin (LIPITOR) 80 MG tablet Take 1 tablet (80 mg total) by mouth daily. 05/25/16  Yes [name], MD  furosemide (LASIX) 80 MG tablet Take 1 tablet (80 mg total) by mouth 2 (two) times daily. 05/25/16 08/23/16 Yes Dawn Lazarus Gowda, MD  gabapentin (NEURONTIN) 300 MG capsule Take 2 capsules (600 mg total) by mouth 2 (two) times daily. 05/25/16  Yes Dawn Lazarus Gowda, MD  Hypromellose (NATURAL  BALANCE TEARS OP) Place 1 drop into both eyes daily as needed (for dry eyes).    Yes Historical Provider, MD  Insulin Glargine (TOUJEO SOLOSTAR) 300 UNIT/ML SOPN Inject 80 Units into the skin daily. 03/04/16  Yes Elayne Snare, MD  Insulin Lispro (HUMALOG KWIKPEN) 200 UNIT/ML SOPN Inject 1 each into the skin 3 (three) times daily. Inject 80-100 units three times daily depending on meal size. 03/04/16  Yes Elayne Snare, MD  metoprolol (LOPRESSOR) 50 MG tablet Take 50 mg by mouth 2 (two) times daily. TWO TABLETS TWICE A DAY   Yes Historical Provider, MD  Multiple Vitamin (MULTIVITAMIN WITH MINERALS) TABS Take 1 tablet by mouth daily.   Yes Historical Provider, MD  oxyCODONE 20 MG TABS Take 1 tablet (20 mg total) by mouth every 6 (six) hours as needed for severe pain. Patient taking differently: Take 20-30 mg by mouth every 6 (six) hours as needed for severe pain.  12/26/15  Yes Hillary Corinda Gubler, MD  rivaroxaban (XARELTO) 20 MG TABS tablet Take 1 tablet (20 mg total) by mouth daily with supper. 05/25/16  Yes [name], MD  spironolactone (ALDACTONE) 25 MG tablet Take 0.5 tablets (12.5 mg total) by mouth daily. 03/05/16  Yes Luke K Kilroy, PA-C  BD INSULIN SYRINGE ULTRAFINE 31G X 15/64" 0.3 ML MISC USE TO IJNECT INSULIN 3 TIMES DAILY. 03/08/16   Historical Provider, MD  Blood Glucose Monitoring Suppl (ACCU-CHEK AVIVA PLUS) w/Device KIT Use to check blood sugar 6 times per day dx code E11.65 04/20/16   Elayne Snare, MD  hydrocerin (EUCERIN) CREA Apply 1 application topically 3 (three) times daily. legs Patient not taking: Reported on 06/09/2016 05/25/16   [name], MD  KLOR-CON M20 20 MEQ tablet  02/02/16   Historical Provider, MD  lisinopril (PRINIVIL,ZESTRIL) 10 MG tablet  02/24/16   Historical Provider, MD  mupirocin cream (BACTROBAN) 2 % Apply topically 2 (two) times daily. Patient not taking: Reported on 06/09/2016 12/26/15   Rogue Bussing, MD    Family History Family History    Problem Relation Age of Onset  . Adopted: Yes  . Other Other     PT ADOPTED    Social History Social History  Substance Use Topics  . Smoking status: Former Smoker    Packs/day: 1.00    Years: 20.00    Types: Cigarettes    Quit date: 07/13/1983  . Smokeless tobacco: Never Used  . Alcohol use No     Allergies   Patient has no known allergies.   Review of Systems Review of Systems  Constitutional: Negative for appetite change, chills, diaphoresis, fatigue and fever.  HENT: Negative for congestion, dental problem and rhinorrhea.   Respiratory: Positive for cough, sputum production and shortness of breath. Negative for chest tightness, wheezing and stridor.   Cardiovascular: Positive for leg swelling. Negative for chest pain.  Gastrointestinal: Negative for abdominal pain, diarrhea, nausea and vomiting.  Genitourinary: Negative for dysuria.  Musculoskeletal: Negative for back pain, neck pain and neck stiffness.  Skin: Negative for rash and wound.  Neurological: Negative for light-headedness, numbness and headaches.  Psychiatric/Behavioral: Negative for agitation.  All other systems reviewed and are negative.    Physical Exam Updated Vital Signs BP 145/76   Pulse 93   Temp 98.9 F (37.2 C) (Oral)   Resp 14   Wt (!) 381 lb 8 oz (173 kg)   SpO2 98%   BMI 51.74 kg/m   Physical Exam  Constitutional: He is oriented to person, place, and time. He appears well-developed and well-nourished.  HENT:  Head: Normocephalic and atraumatic.  Mouth/Throat: Oropharynx is clear and moist. No oropharyngeal exudate.  Eyes: Conjunctivae and EOM are normal. Pupils are equal, round, and reactive to light.  Neck: Normal range of motion. Neck supple.  Cardiovascular: Normal rate and regular rhythm.   No murmur heard. Pulmonary/Chest: Effort normal. No stridor. Tachypnea noted. No respiratory distress. He has no wheezes. He has rales in the right lower field and the left lower field. He  exhibits no tenderness.  Abdominal: Soft. There is no tenderness.  Musculoskeletal: He exhibits edema. He exhibits no tenderness.       Right lower leg: He exhibits edema.       Left lower leg: He exhibits edema.  Neurological: He is alert and oriented to person, place, and time. He displays normal reflexes. No cranial nerve deficit or sensory deficit. He exhibits normal muscle tone.  Skin: Skin is warm and dry. Capillary refill takes less than 2 seconds. No erythema. No pallor.  Psychiatric: He has a normal mood and affect.  Nursing note and vitals reviewed.    ED Treatments / Results  Labs (all labs ordered are listed, but only abnormal results are displayed) Labs Reviewed  BASIC METABOLIC PANEL - Abnormal; Notable for the following:       Result Value   Potassium 3.0 (*)    Chloride 100 (*)    Glucose, Bld 148 (*)    Creatinine, Ser 1.37 (*)    Calcium 8.8 (*)    GFR calc non Af Amer 53 (*)    All other components within normal limits  CBC - Abnormal; Notable for the following:    WBC 13.8 (*)    Hemoglobin 10.6 (*)    HCT 34.8 (*)    MCV 77.7 (*)    MCH 23.7 (*)    RDW 17.9 (*)    All other components within normal limits  BRAIN NATRIURETIC PEPTIDE - Abnormal; Notable for the following:    B Natriuretic Peptide 146.1 (*)    All other components within normal limits  I-STAT CG4 LACTIC ACID, ED - Abnormal; Notable for the following:    Lactic Acid, Venous 2.56 (*)    All other components within normal limits  URINALYSIS, ROUTINE W REFLEX MICROSCOPIC (NOT AT [facility]  Randolm Idol, ED    EKG  EKG Interpretation  Date/Time:  Wednesday June 09 2016 14:58:42 EST Ventricular Rate:  109 PR Interval:    QRS Duration: 96 QT Interval:  284 QTC Calculation: 382 R  Axis:   0 Text Interpretation:  Atrial fibrillation with rapid ventricular response with premature ventricular or aberrantly conducted complexes Low voltage QRS Cannot rule out Anterior infarct , age  undetermined Abnormal ECG When compared to prior, rate faster.  No STEMI Abnormal ECG Confirmed by Sherry Ruffing MD, [place] [id number]) on 06/09/2016 10:59:52 PM       Radiology Dg Chest 2 View  Result Date: 06/09/2016 CLINICAL DATA:  Shortness of breath, leg swelling for 1 week EXAM: CHEST  2 VIEW COMPARISON:  Chest x-ray of 07/08/2015 FINDINGS: The patient could not stand and the patient's chin overlies the upper lung feels obscuring detail. However on the best images possible, no pneumonia is seen. No effusion is noted. Mediastinal and hilar contours are unremarkable. Mild pulmonary vascular congestion cannot be excluded. The heart is mildly enlarged. No bony abnormality is seen. IMPRESSION: 1. Cardiomegaly.  Cannot exclude mild pulmonary vascular congestion. 2. No pneumonia or effusion. Electronically Signed   By: Ivar Drape M.D.   On: 06/09/2016 16:10    Procedures Procedures (including critical care time)  Medications Ordered in ED Medications  atorvastatin (LIPITOR) tablet 80 mg (not administered)  gabapentin (NEURONTIN) capsule 600 mg (not administered)  rivaroxaban (XARELTO) tablet 20 mg (not administered)  Insulin Glargine SOPN 80 Units (not administered)  spironolactone (ALDACTONE) tablet 12.5 mg (not administered)  Oxycodone HCl TABS 20-30 mg (not administered)  metoprolol tartrate (LOPRESSOR) tablet 50 mg (not administered)  potassium chloride SA (K-DUR,KLOR-CON) CR tablet 40 mEq (40 mEq Oral Given 06/09/16 2226)  fentaNYL (SUBLIMAZE) injection 50 mcg (50 mcg Intravenous Given 06/09/16 2226)     Initial Impression / Assessment and Plan / ED Course  I have reviewed the triage vital signs and the nursing notes.  Pertinent labs & imaging results that were available during my care of the patient were reviewed by me and considered in my medical decision making (see chart for details).  Clinical Course     [name] is a 65 y.o. male with past medical history significant  for coronary artery disease status post PCI, CHF, CKD, prior GI bleed, A. fib on is a relative, and B's who presents with worsening shortness of breath, generalized edema, and cough.  History and exam are seen above. Next  On exam, patient has crackles in the lower lobes of his lungs. Patient has bilateral pitting edema in both lower external. Patient also has visible edema in his bilateral hands. Patient's abdomen is nontender. Patient has weeping in the lower extremities. Patient has no focal neurologic deficits.  Based on patient's history and exam, clinical concern is present for fluid overload and CHF exacerbation. Laboratory testing, imaging testing, EKG were obtained.  EKG showed atrial fibrillation with slight tachycardia. No evidence of ST elevation MI. Lab testing showed mild leukocytosis of 13.8. Mild anemia of 10.6. P MP showed hypokalemia of 3.0. Oral potassium supplementation was ordered. Slightly increased creatinine compared to prior. Troponin negative. BNP slightly elevated at 146. Lactic acid was also elevated at 2.56. Fluids will be held at this time because primary concern for patient is fluid overload and CHF exacerbation, fluid to make this worse.  Structures ordered and showed mild pulmonary congestion however no evidence of pneumonia. Do not feel patient's elevated lactic acidosis secondary to infection at this time however, urinalysis was ordered to rule out this as a source of infection.   Given patient's continued shortness of breath and concern for fluid overload, patient will be admitted to hospitalist service for  further management. Dissipate close monitoring of fluid balance in setting of kidney dysfunction and CHF.   Final Clinical Impressions(s) / ED Diagnoses   Final diagnoses:  Peripheral edema  Shortness of breath    Clinical Impression: 1. Peripheral edema   2. Shortness of breath     Disposition: Admit to [place],  MD 06/10/16 [phone]

## 2016-06-10 ENCOUNTER — Inpatient Hospital Stay (HOSPITAL_COMMUNITY): Payer: Medicare Other

## 2016-06-10 DIAGNOSIS — E785 Hyperlipidemia, unspecified: Secondary | ICD-10-CM | POA: Diagnosis present

## 2016-06-10 DIAGNOSIS — Z87891 Personal history of nicotine dependence: Secondary | ICD-10-CM | POA: Diagnosis not present

## 2016-06-10 DIAGNOSIS — E1169 Type 2 diabetes mellitus with other specified complication: Secondary | ICD-10-CM | POA: Diagnosis not present

## 2016-06-10 DIAGNOSIS — I509 Heart failure, unspecified: Secondary | ICD-10-CM

## 2016-06-10 DIAGNOSIS — R0602 Shortness of breath: Secondary | ICD-10-CM | POA: Diagnosis present

## 2016-06-10 DIAGNOSIS — E1122 Type 2 diabetes mellitus with diabetic chronic kidney disease: Secondary | ICD-10-CM | POA: Diagnosis present

## 2016-06-10 DIAGNOSIS — I4891 Unspecified atrial fibrillation: Secondary | ICD-10-CM | POA: Diagnosis not present

## 2016-06-10 DIAGNOSIS — E871 Hypo-osmolality and hyponatremia: Secondary | ICD-10-CM | POA: Diagnosis not present

## 2016-06-10 DIAGNOSIS — L03115 Cellulitis of right lower limb: Secondary | ICD-10-CM | POA: Diagnosis present

## 2016-06-10 DIAGNOSIS — N179 Acute kidney failure, unspecified: Secondary | ICD-10-CM | POA: Diagnosis present

## 2016-06-10 DIAGNOSIS — I482 Chronic atrial fibrillation: Secondary | ICD-10-CM | POA: Diagnosis not present

## 2016-06-10 DIAGNOSIS — E876 Hypokalemia: Secondary | ICD-10-CM | POA: Diagnosis present

## 2016-06-10 DIAGNOSIS — E872 Acidosis: Secondary | ICD-10-CM | POA: Diagnosis present

## 2016-06-10 DIAGNOSIS — I5023 Acute on chronic systolic (congestive) heart failure: Secondary | ICD-10-CM | POA: Diagnosis not present

## 2016-06-10 DIAGNOSIS — I2510 Atherosclerotic heart disease of native coronary artery without angina pectoris: Secondary | ICD-10-CM | POA: Diagnosis present

## 2016-06-10 DIAGNOSIS — L03116 Cellulitis of left lower limb: Secondary | ICD-10-CM | POA: Diagnosis present

## 2016-06-10 DIAGNOSIS — M545 Low back pain: Secondary | ICD-10-CM | POA: Diagnosis present

## 2016-06-10 DIAGNOSIS — G8929 Other chronic pain: Secondary | ICD-10-CM | POA: Diagnosis present

## 2016-06-10 DIAGNOSIS — Z7901 Long term (current) use of anticoagulants: Secondary | ICD-10-CM | POA: Diagnosis not present

## 2016-06-10 DIAGNOSIS — R0902 Hypoxemia: Secondary | ICD-10-CM | POA: Diagnosis present

## 2016-06-10 DIAGNOSIS — N141 Nephropathy induced by other drugs, medicaments and biological substances: Secondary | ICD-10-CM | POA: Diagnosis present

## 2016-06-10 DIAGNOSIS — T501X5A Adverse effect of loop [high-ceiling] diuretics, initial encounter: Secondary | ICD-10-CM | POA: Diagnosis present

## 2016-06-10 DIAGNOSIS — N183 Chronic kidney disease, stage 3 (moderate): Secondary | ICD-10-CM | POA: Diagnosis present

## 2016-06-10 DIAGNOSIS — Y929 Unspecified place or not applicable: Secondary | ICD-10-CM | POA: Diagnosis not present

## 2016-06-10 DIAGNOSIS — Z955 Presence of coronary angioplasty implant and graft: Secondary | ICD-10-CM | POA: Diagnosis not present

## 2016-06-10 DIAGNOSIS — Z9861 Coronary angioplasty status: Secondary | ICD-10-CM | POA: Diagnosis not present

## 2016-06-10 DIAGNOSIS — I130 Hypertensive heart and chronic kidney disease with heart failure and stage 1 through stage 4 chronic kidney disease, or unspecified chronic kidney disease: Secondary | ICD-10-CM | POA: Diagnosis present

## 2016-06-10 DIAGNOSIS — I480 Paroxysmal atrial fibrillation: Secondary | ICD-10-CM | POA: Diagnosis not present

## 2016-06-10 DIAGNOSIS — I5033 Acute on chronic diastolic (congestive) heart failure: Secondary | ICD-10-CM | POA: Diagnosis present

## 2016-06-10 DIAGNOSIS — G4733 Obstructive sleep apnea (adult) (pediatric): Secondary | ICD-10-CM | POA: Diagnosis present

## 2016-06-10 DIAGNOSIS — Z6841 Body Mass Index (BMI) 40.0 and over, adult: Secondary | ICD-10-CM | POA: Diagnosis not present

## 2016-06-10 DIAGNOSIS — I481 Persistent atrial fibrillation: Secondary | ICD-10-CM | POA: Diagnosis present

## 2016-06-10 DIAGNOSIS — Z9119 Patient's noncompliance with other medical treatment and regimen: Secondary | ICD-10-CM | POA: Diagnosis not present

## 2016-06-10 LAB — URINE MICROSCOPIC-ADD ON

## 2016-06-10 LAB — TROPONIN I
TROPONIN I: 0.06 ng/mL — AB (ref <0.03)
TROPONIN I: 0.03 ng/mL — AB (ref <0.03)
Troponin I: 0.05 ng/mL (ref <0.03)

## 2016-06-10 LAB — GLUCOSE, CAPILLARY
GLUCOSE-CAPILLARY: 337 mg/dL — AB (ref 65–99)
Glucose-Capillary: 299 mg/dL — ABNORMAL HIGH (ref 65–99)
Glucose-Capillary: 266 mg/dL — ABNORMAL HIGH (ref 65–99)

## 2016-06-10 LAB — URINALYSIS, ROUTINE W REFLEX MICROSCOPIC
BILIRUBIN URINE: NEGATIVE
Glucose, UA: NEGATIVE mg/dL
HGB URINE DIPSTICK: NEGATIVE
Ketones, ur: NEGATIVE mg/dL
Leukocytes, UA: NEGATIVE
NITRITE: NEGATIVE
PH: 7.5 (ref 5.0–8.0)
Protein, ur: 30 mg/dL — AB
SPECIFIC GRAVITY, URINE: 1.02 (ref 1.005–1.030)

## 2016-06-10 LAB — SODIUM, URINE, RANDOM: SODIUM UR: 100 mmol/L

## 2016-06-10 LAB — I-STAT CG4 LACTIC ACID, ED: LACTIC ACID, VENOUS: 2.53 mmol/L — AB (ref 0.5–1.9)

## 2016-06-10 LAB — CREATININE, URINE, RANDOM: Creatinine, Urine: 187.19 mg/dL

## 2016-06-10 LAB — LACTIC ACID, PLASMA
Lactic Acid, Venous: 2.3 mmol/L (ref 0.5–1.9)
Lactic Acid, Venous: 3.5 mmol/L (ref 0.5–1.9)

## 2016-06-10 MED ORDER — ASPIRIN EC 81 MG PO TBEC
81.0000 mg | DELAYED_RELEASE_TABLET | Freq: Every day | ORAL | Status: DC
  Administered 2016-06-10 – 2016-06-17 (×8): 81 mg via ORAL
  Filled 2016-06-10 (×8): qty 1

## 2016-06-10 MED ORDER — POTASSIUM CHLORIDE CRYS ER 20 MEQ PO TBCR
40.0000 meq | EXTENDED_RELEASE_TABLET | Freq: Two times a day (BID) | ORAL | Status: DC
  Administered 2016-06-10 – 2016-06-17 (×16): 40 meq via ORAL
  Filled 2016-06-10 (×5): qty 2
  Filled 2016-06-10: qty 4
  Filled 2016-06-10 (×10): qty 2

## 2016-06-10 MED ORDER — FUROSEMIDE 10 MG/ML IJ SOLN: 40.0000 mg | Freq: Two times a day (BID) | INTRAMUSCULAR | Status: DC

## 2016-06-10 MED ORDER — INSULIN ASPART 100 UNIT/ML ~~LOC~~ SOLN
0.0000 [IU] | Freq: Three times a day (TID) | SUBCUTANEOUS | Status: DC
  Administered 2016-06-10: 7 [IU] via SUBCUTANEOUS
  Administered 2016-06-10: 5 [IU] via SUBCUTANEOUS
  Administered 2016-06-11: 3 [IU] via SUBCUTANEOUS

## 2016-06-10 MED ORDER — SODIUM CHLORIDE 0.9% FLUSH
3.0000 mL | Freq: Two times a day (BID) | INTRAVENOUS | Status: DC
  Administered 2016-06-10 – 2016-06-15 (×11): 3 mL via INTRAVENOUS

## 2016-06-10 MED ORDER — METOPROLOL TARTRATE 50 MG PO TABS
50.0000 mg | ORAL_TABLET | Freq: Two times a day (BID) | ORAL | Status: DC
  Administered 2016-06-10 – 2016-06-17 (×17): 50 mg via ORAL
  Filled 2016-06-10: qty 2
  Filled 2016-06-10 (×16): qty 1

## 2016-06-10 MED ORDER — OXYCODONE HCL 5 MG PO TABS
20.0000 mg | ORAL_TABLET | Freq: Four times a day (QID) | ORAL | Status: DC | PRN
  Administered 2016-06-10 (×4): 30 mg via ORAL
  Administered 2016-06-10: 20 mg via ORAL
  Administered 2016-06-11 – 2016-06-15 (×14): 30 mg via ORAL
  Filled 2016-06-10 (×3): qty 6
  Filled 2016-06-10: qty 4
  Filled 2016-06-10 (×16): qty 6

## 2016-06-10 MED ORDER — ATORVASTATIN CALCIUM 80 MG PO TABS
80.0000 mg | ORAL_TABLET | Freq: Every day | ORAL | Status: DC
  Administered 2016-06-10 – 2016-06-17 (×8): 80 mg via ORAL
  Filled 2016-06-10 (×8): qty 1

## 2016-06-10 MED ORDER — SODIUM CHLORIDE 0.9 % IV BOLUS (SEPSIS): 1000.0000 mL | Freq: Once | INTRAVENOUS | Status: DC

## 2016-06-10 MED ORDER — INSULIN ASPART 100 UNIT/ML ~~LOC~~ SOLN
0.0000 [IU] | Freq: Every day | SUBCUTANEOUS | Status: DC
  Administered 2016-06-10 – 2016-06-14 (×4): 3 [IU] via SUBCUTANEOUS

## 2016-06-10 MED ORDER — RIVAROXABAN 20 MG PO TABS
20.0000 mg | ORAL_TABLET | Freq: Every day | ORAL | Status: DC
  Administered 2016-06-10 – 2016-06-17 (×8): 20 mg via ORAL
  Filled 2016-06-10 (×8): qty 1

## 2016-06-10 MED ORDER — FUROSEMIDE 10 MG/ML IJ SOLN
80.0000 mg | Freq: Two times a day (BID) | INTRAMUSCULAR | Status: DC
  Administered 2016-06-10 – 2016-06-15 (×11): 80 mg via INTRAVENOUS
  Filled 2016-06-10 (×11): qty 8

## 2016-06-10 MED ORDER — SODIUM CHLORIDE 0.9% FLUSH: 3.0000 mL | INTRAVENOUS | Status: DC | PRN

## 2016-06-10 MED ORDER — GABAPENTIN 300 MG PO CAPS
600.0000 mg | ORAL_CAPSULE | Freq: Two times a day (BID) | ORAL | Status: DC
  Administered 2016-06-10 – 2016-06-17 (×16): 600 mg via ORAL
  Filled 2016-06-10 (×16): qty 2

## 2016-06-10 MED ORDER — ACETAMINOPHEN 325 MG PO TABS: 650.0000 mg | ORAL_TABLET | ORAL | Status: DC | PRN

## 2016-06-10 MED ORDER — FUROSEMIDE 10 MG/ML IJ SOLN
80.0000 mg | Freq: Once | INTRAMUSCULAR | Status: AC
  Administered 2016-06-10: 80 mg via INTRAVENOUS
  Filled 2016-06-10: qty 8

## 2016-06-10 MED ORDER — SPIRONOLACTONE 25 MG PO TABS
12.5000 mg | ORAL_TABLET | Freq: Every day | ORAL | Status: DC
Start: 2016-06-10 — End: 2016-06-16
  Administered 2016-06-10 – 2016-06-16 (×7): 12.5 mg via ORAL
  Filled 2016-06-10 (×7): qty 1

## 2016-06-10 MED ORDER — ONDANSETRON HCL 4 MG/2ML IJ SOLN: 4.0000 mg | Freq: Four times a day (QID) | INTRAMUSCULAR | Status: DC | PRN

## 2016-06-10 MED ORDER — SODIUM CHLORIDE 0.9 % IV SOLN
250.0000 mL | INTRAVENOUS | Status: DC | PRN
Start: 2016-06-10 — End: 2016-06-18

## 2016-06-10 MED ORDER — INSULIN GLARGINE 100 UNIT/ML ~~LOC~~ SOLN
80.0000 [IU] | Freq: Every day | SUBCUTANEOUS | Status: DC
  Administered 2016-06-10 – 2016-06-13 (×4): 80 [IU] via SUBCUTANEOUS
  Filled 2016-06-10 (×6): qty 0.8

## 2016-06-10 NOTE — ED Notes (Signed)
Call placed to floor coverage for clarification of orders.

## 2016-06-10 NOTE — ED Notes (Signed)
Admitting MD paged and responded RE fluid bolus order for elevated increased lactic acid.  He stated to hold fluids until he assesses the pt in the next hour or two.

## 2016-06-10 NOTE — ED Notes (Signed)
Hospital bed ordered.

## 2016-06-10 NOTE — ED Notes (Signed)
Pt to US.

## 2016-06-10 NOTE — Progress Notes (Signed)
PT Cancellation Note  Patient Details Name: [name] MRN: [id number] DOB: Nov 17, 1950   Cancelled Treatment:    Reason Eval/Treat Not Completed: Patient declined, no reason specified Pt declined working with PT despite education on importance of mobility. Will follow up next available time.   Blake Divine A Dafne Nield 06/10/2016, 3:19 PM Mylo Red, PT, DPT [phone]

## 2016-06-10 NOTE — ED Notes (Signed)
Pt returned from US

## 2016-06-10 NOTE — Progress Notes (Signed)
PROGRESS NOTE  [name] TFT:[id number] DOB: March 03, 1951 DOA: 06/09/2016 PCP: [name], MD  Hospital Course/Subjective: 65 yo male with morbid obesity, DM2, Chronic Afib on Xarelto, CAD s/p PCI and diastolic HF admitted with several days of SOB with exertion, 20 lb weight gain and dark urine for acute exacerbation of diastolic heart failure.   He confirms to me no fevers, cough with clear sputum no chest pain. Has already urinated quite a bit of clear urine. This feels like his prior CHF exacerbations, no different.  Assessment/Plan: . Acute on chronic diastolic heart failure (HCC) - admit to step down on telemetry, cycle cardiac enzymes, obtain serial ECG, to evaluate for ischemia as a cause of heart failure, and monitor daily weight We will diurese with IV lasix 40 bid and monitor orthostatics and creatinine to avoid over diuresis. Daily K supplementation and follow daily labs Restart beta blocker Order echogram to evaluate EF and valves. Not on ACE/ARBi due to renal insuficiency Consider cardiology consult if no improvement   . Atrial fibrillation - chronic will restart metoprolol monitor on tele, continue Xarelto CHA2DS2 vas score 4   . CKD (chronic kidney disease) stage 3, GFR 30-59 ml/min - Cr slightly up from previous given fluid overload will diurese. renal US normal  . Diabetes mellitus type 2 in obese (HCC) order SSI, cont home lantus  . Essential hypertension, benign - continue home medication   . Hyperlipidemia - continue home medication  . HYPOKALEMIA - replace and monitor  . Obstructive sleep apnea- unable to tolerate c-pap, will hold off  . Lactic acidosis -  reccurent no evidence of infectious etiology at this point, no fevers, wbc chronically elevated not changed. Will follow occasionally.  DVT Prophylaxis: Xarelto  Code Status: FULL   Family Communication: None   Disposition Plan: Home in 3-4 days, PT/OT eval ordered.   Consultants:  None    Procedures:  None   Antimicrobials:  None    Objective: Vitals:   06/10/16 0630 06/10/16 0700 06/10/16 0719 06/10/16 0750  BP: 122/65 106/60 111/63   Pulse: 87 94 94   Resp: 19 17 12    Temp:    97.8 F (36.6 C)  TempSrc:    Oral  SpO2: 96% 90% (!) 88%   Weight:        Intake/Output Summary (Last 24 hours) at 06/10/16 0845 Last data filed at 06/10/16 0305  Gross per 24 hour  Intake                0 ml  Output             1225 ml  Net            -1225 ml   Filed Weights   06/09/16 1512  Weight: (!) 173 kg (381 lb 8 oz)     Exam: General:  Alert, oriented, calm, in no acute distress, morbidly obese Eyes: EOMI, clear sclerea Neck: supple, no masses, trachea mildline, unable to see JVD due to big neck Cardiovascular: RRR, no murmurs or rubs, tight 3+ BLE peripheral edema  Respiratory: clear to auscultation bilaterally, no wheezes, no crackles  Abdomen: soft, nontender, nondistended, normal bowel tones heard, obese Skin: dry, scaly chronic appearing rash on BLE Musculoskeletal: no joint effusions, normal range of motion  Psychiatric: appropriate affect, normal speech  Neurologic: extraocular muscles intact, clear speech, moving all extremities with intact sensorium    Data Reviewed: CBC:  Recent Labs Lab 06/09/16 1501  WBC  13.8*  HGB 10.6*  HCT 34.8*  MCV 77.7*  PLT 319   Basic Metabolic Panel:  Recent Labs Lab 06/09/16 1501  NA 139  K 3.0*  CL 100*  CO2 28  GLUCOSE 148*  BUN 9  CREATININE 1.37*  CALCIUM 8.8*   GFR: Estimated Creatinine Clearance: 89.2 mL/min (by C-G formula based on SCr of 1.37 mg/dL (H)). Liver Function Tests: No results for input(s): AST, ALT, ALKPHOS, BILITOT, PROT, ALBUMIN in the last 168 hours. No results for input(s): LIPASE, AMYLASE in the last 168 hours. No results for input(s): AMMONIA in the last 168 hours. Coagulation Profile: No results for input(s): INR, PROTIME in the last 168 hours. Cardiac Enzymes: No  results for input(s): CKTOTAL, CKMB, CKMBINDEX, TROPONINI in the last 168 hours. BNP (last 3 results) No results for input(s): PROBNP in the last 8760 hours. HbA1C: No results for input(s): HGBA1C in the last 72 hours. CBG: No results for input(s): GLUCAP in the last 168 hours. Lipid Profile: No results for input(s): CHOL, HDL, LDLCALC, TRIG, CHOLHDL, LDLDIRECT in the last 72 hours. Thyroid Function Tests: No results for input(s): TSH, T4TOTAL, FREET4, T3FREE, THYROIDAB in the last 72 hours. Anemia Panel: No results for input(s): VITAMINB12, FOLATE, FERRITIN, TIBC, IRON, RETICCTPCT in the last 72 hours. Urine analysis:    Component Value Date/Time   COLORURINE AMBER (A) 06/10/2016 0033   APPEARANCEUR CLEAR 06/10/2016 0033   LABSPEC 1.020 06/10/2016 0033   PHURINE 7.5 06/10/2016 0033   GLUCOSEU NEGATIVE 06/10/2016 0033   HGBUR NEGATIVE 06/10/2016 0033   BILIRUBINUR NEGATIVE 06/10/2016 0033   KETONESUR NEGATIVE 06/10/2016 0033   PROTEINUR 30 (A) 06/10/2016 0033   UROBILINOGEN 1.0 11/27/2014 0755   NITRITE NEGATIVE 06/10/2016 0033   LEUKOCYTESUR NEGATIVE 06/10/2016 0033   Sepsis Labs: @LABRCNTIP (procalcitonin:4,lacticidven:4)  )No results found for this or any previous visit (from the past 240 hour(s)).   Studies: Dg Chest 2 View  Result Date: 06/09/2016 CLINICAL DATA:  Shortness of breath, leg swelling for 1 week EXAM: CHEST  2 VIEW COMPARISON:  Chest x-ray of 07/08/2015 FINDINGS: The patient could not stand and the patient's chin overlies the upper lung feels obscuring detail. However on the best images possible, no pneumonia is seen. No effusion is noted. Mediastinal and hilar contours are unremarkable. Mild pulmonary vascular congestion cannot be excluded. The heart is mildly enlarged. No bony abnormality is seen. IMPRESSION: 1. Cardiomegaly.  Cannot exclude mild pulmonary vascular congestion. 2. No pneumonia or effusion. Electronically Signed   By: 07/10/2015 M.D.   On:  06/09/2016 16:10   06/11/2016 Renal  Result Date: 06/10/2016 CLINICAL DATA:  Acute onset of renal insufficiency. Initial encounter. EXAM: RENAL / URINARY TRACT ULTRASOUND COMPLETE COMPARISON:  CT of the abdomen and pelvis performed 09/15/2015 FINDINGS: Right Kidney: Length: 13.8 cm. Echogenicity within normal limits. No mass or hydronephrosis visualized. Left Kidney: Length: 13.4 cm. Echogenicity within normal limits. No mass or hydronephrosis visualized. Bladder: Appears normal for degree of bladder distention. IMPRESSION: Unremarkable renal ultrasound.  No evidence of hydronephrosis. Electronically Signed   By: 11/15/2015 M.D.   On: 06/10/2016 03:28    Scheduled Meds: . atorvastatin  80 mg Oral q1800  . furosemide  40 mg Intravenous Q12H  . gabapentin  600 mg Oral BID  . insulin glargine  80 Units Subcutaneous Daily  . metoprolol  50 mg Oral BID  . potassium chloride  40 mEq Oral BID  . rivaroxaban  20 mg Oral Q supper  .  spironolactone  12.5 mg Oral Daily    Continuous Infusions:   LOS: 0 days   Time spent: 32 minutes  Blayden Conwell Vergie Living, MD [name] Pager [phone]  If 7PM-7AM, please contact night-coverage [email or web address] Password [email or web address] 06/10/2016, 8:45 AM

## 2016-06-11 ENCOUNTER — Encounter (HOSPITAL_COMMUNITY): Payer: Self-pay | Admitting: Cardiology

## 2016-06-11 ENCOUNTER — Inpatient Hospital Stay (HOSPITAL_COMMUNITY): Payer: Medicare Other

## 2016-06-11 DIAGNOSIS — I482 Chronic atrial fibrillation: Secondary | ICD-10-CM

## 2016-06-11 DIAGNOSIS — I509 Heart failure, unspecified: Secondary | ICD-10-CM

## 2016-06-11 DIAGNOSIS — N183 Chronic kidney disease, stage 3 (moderate): Secondary | ICD-10-CM

## 2016-06-11 DIAGNOSIS — I5033 Acute on chronic diastolic (congestive) heart failure: Secondary | ICD-10-CM

## 2016-06-11 LAB — ECHOCARDIOGRAM COMPLETE: WEIGHTICAEL: 5936 [oz_av]

## 2016-06-11 LAB — GLUCOSE, CAPILLARY
GLUCOSE-CAPILLARY: 315 mg/dL — AB (ref 65–99)
GLUCOSE-CAPILLARY: 170 mg/dL — AB (ref 65–99)
Glucose-Capillary: 215 mg/dL — ABNORMAL HIGH (ref 65–99)
Glucose-Capillary: 249 mg/dL — ABNORMAL HIGH (ref 65–99)

## 2016-06-11 LAB — CBC
HEMATOCRIT: 34.3 % — AB (ref 39.0–52.0)
HEMOGLOBIN: 10.4 g/dL — AB (ref 13.0–17.0)
MCH: 23.5 pg — AB (ref 26.0–34.0)
MCHC: 30.3 g/dL (ref 30.0–36.0)
MCV: 77.4 fL — ABNORMAL LOW (ref 78.0–100.0)
Platelets: 332 10*3/uL (ref 150–400)
RBC: 4.43 MIL/uL (ref 4.22–5.81)
RDW: 17.7 % — AB (ref 11.5–15.5)
WBC: 13.5 10*3/uL — ABNORMAL HIGH (ref 4.0–10.5)

## 2016-06-11 LAB — BASIC METABOLIC PANEL
ANION GAP: 12 (ref 5–15)
BUN: 12 mg/dL (ref 6–20)
CALCIUM: 8.7 mg/dL — AB (ref 8.9–10.3)
CO2: 29 mmol/L (ref 22–32)
Chloride: 97 mmol/L — ABNORMAL LOW (ref 101–111)
Creatinine, Ser: 1.39 mg/dL — ABNORMAL HIGH (ref 0.61–1.24)
GFR calc Af Amer: >60 mL/min (ref >60)
GFR calc non Af Amer: 52 mL/min — ABNORMAL LOW (ref >60)
GLUCOSE: 205 mg/dL — AB (ref 65–99)
POTASSIUM: 3.6 mmol/L (ref 3.5–5.1)
Sodium: 138 mmol/L (ref 135–145)

## 2016-06-11 LAB — BRAIN NATRIURETIC PEPTIDE: B Natriuretic Peptide: 196.1 pg/mL — ABNORMAL HIGH (ref 0.0–100.0)

## 2016-06-11 LAB — HEMOGLOBIN A1C
HEMOGLOBIN A1C: 8.1 % — AB (ref 4.8–5.6)
MEAN PLASMA GLUCOSE: 186 mg/dL

## 2016-06-11 MED ORDER — INSULIN ASPART 100 UNIT/ML ~~LOC~~ SOLN
4.0000 [IU] | Freq: Three times a day (TID) | SUBCUTANEOUS | Status: DC
  Administered 2016-06-11 – 2016-06-13 (×6): 4 [IU] via SUBCUTANEOUS

## 2016-06-11 MED ORDER — INSULIN ASPART 100 UNIT/ML ~~LOC~~ SOLN
0.0000 [IU] | Freq: Three times a day (TID) | SUBCUTANEOUS | Status: DC
  Administered 2016-06-11: 5 [IU] via SUBCUTANEOUS
  Administered 2016-06-11: 11 [IU] via SUBCUTANEOUS
  Administered 2016-06-12: 8 [IU] via SUBCUTANEOUS
  Administered 2016-06-12 – 2016-06-13 (×4): 5 [IU] via SUBCUTANEOUS
  Administered 2016-06-13: 11 [IU] via SUBCUTANEOUS
  Administered 2016-06-14: 5 [IU] via SUBCUTANEOUS
  Administered 2016-06-14: 15 [IU] via SUBCUTANEOUS
  Administered 2016-06-14 – 2016-06-15 (×2): 8 [IU] via SUBCUTANEOUS
  Administered 2016-06-15 (×2): 15 [IU] via SUBCUTANEOUS
  Administered 2016-06-16: 8 [IU] via SUBCUTANEOUS
  Administered 2016-06-16 (×2): 15 [IU] via SUBCUTANEOUS
  Administered 2016-06-17: 5 [IU] via SUBCUTANEOUS
  Administered 2016-06-17: 2 [IU] via SUBCUTANEOUS
  Administered 2016-06-17: 11 [IU] via SUBCUTANEOUS

## 2016-06-11 MED ORDER — DOXYCYCLINE HYCLATE 100 MG PO TABS
100.0000 mg | ORAL_TABLET | Freq: Two times a day (BID) | ORAL | Status: DC
  Administered 2016-06-11 – 2016-06-17 (×14): 100 mg via ORAL
  Filled 2016-06-11 (×14): qty 1

## 2016-06-11 MED ORDER — MORPHINE SULFATE (PF) 2 MG/ML IV SOLN
1.0000 mg | INTRAVENOUS | Status: DC | PRN
  Administered 2016-06-11 – 2016-06-17 (×28): 2 mg via INTRAVENOUS
  Filled 2016-06-11 (×28): qty 1

## 2016-06-11 MED ORDER — PERFLUTREN LIPID MICROSPHERE
1.0000 mL | INTRAVENOUS | Status: AC | PRN
  Administered 2016-06-11: 3 mL via INTRAVENOUS
  Filled 2016-06-11: qty 10

## 2016-06-11 NOTE — Hospital Discharge Follow-Up (Signed)
Transitional Care Clinic:  This Case Manager spoke with Marvetta Gibbons, RN CM, and it was determined patient may benefit from the [facility] at [facility] and [facility]. This Case Manager met with patient at bedside and informed patient of the case management services and medical management provided by the clinic. Patient uncertain if interested in [facility] and indicated he would like time to decide. He also discussed transportation barrier. Provided patient with Transitional Care Case Manager contact information. Marvetta Gibbons, RN CM updated.

## 2016-06-11 NOTE — Progress Notes (Signed)
[facility]                                                                              Patient Demographics  [name], is a 65 y.o. male, DOB - [birth year], SPQ:[id number]  Admit date - 06/09/2016   Admitting Physician Therisa Doyne, MD  Outpatient Primary MD for the patient is [name], MD  Outpatient specialists:   LOS - 1  days    Chief Complaint  Patient presents with  . Shortness of Breath  . Leg Swelling       Brief summary   65 yo male with morbid obesity, DM2, Chronic Afib on Xarelto, CAD s/p PCI and diastolic HF admitted with several days of SOB with exertion, 20 lb weight gain and dark urine for acute exacerbation of diastolic heart failure.    Assessment & Plan   Acute on chronic diastolic heart failure (HCC) -  - continue IV lasix for diuresis  - weight down 10lbs  - continue stritc I's and O's   - follow 2D echo  - Not on ACE/ARBi due to renal insuficiency - Cardiology consulted, Dr Ludwig Clarks patient   .Atrial fibrillation - chronic -  will restart metoprolol monitor on tele,  - continue Xarelto CHA2DS2 vas score 4  .CKD (chronic kidney disease) stage 3, GFR 30-59 ml/min - Cr slightly up likely due to lasix diuresis   - renal US normal  .Diabetes mellitus type 2 in obese (HCC), uncontrolled   - increased to moderate sliding scale, added meal coverage, cont home lantus  .Essential hypertension, benign - continue home medication   .Hyperlipidemia - continue home medication  .Hypokalemia - replace and monitor  .Obstructive sleep apnea -  unable to tolerate c-pap, will hold off  .LE cellulitis:  - erythematous, foul smelling, placed on doxycyline    Code Status: full code DVT Prophylaxis:  xarelto Family Communication: Discussed in detail with the patient, all imaging results, lab results explained to the patient  Disposition Plan:   Time Spent in minutes  25 minutes  Procedures:  None    Consultants:   cardiology  Antimicrobials:   Doxycycline    Medications  Scheduled Meds: . aspirin EC  81 mg Oral Daily  . atorvastatin  80 mg Oral q1800  . doxycycline  100 mg Oral Q12H  . furosemide  80 mg Intravenous BID  . gabapentin  600 mg Oral BID  . insulin aspart  0-15 Units Subcutaneous TID WC  . insulin aspart  0-5 Units Subcutaneous QHS  . insulin aspart  4 Units Subcutaneous TID WC  . insulin glargine  80 Units Subcutaneous Daily  . metoprolol  50 mg Oral BID  . potassium chloride  40 mEq Oral BID  . rivaroxaban  20 mg Oral Q supper  . sodium chloride flush  3 mL Intravenous Q12H  . spironolactone  12.5 mg Oral Daily   Continuous Infusions: PRN Meds:.sodium chloride, acetaminophen, morphine injection, ondansetron (ZOFRAN) IV, oxyCODONE, sodium chloride flush   Antibiotics   Anti-infectives    Start     Dose/Rate Route Frequency Ordered Stop   06/11/16 1000  doxycycline (VIBRA-TABS) tablet 100 mg     100 mg Oral Every 12 hours 06/11/16 0730          Subjective:   [name] was seen and examined today.  Patient denies dizziness, chest pain, abdominal pain, N/V/D/C, new weakness, numbess, tingling. No acute events overnight.  Shortness of breath improving. LE edema still persisting, redness, foul smelling odor   Objective:   Vitals:   06/10/16 0905 06/10/16 1326 06/10/16 2101 06/11/16 0514  BP: (!) 142/65 112/60 (!) 113/58 (!) 116/45  Pulse: (!) 138 82 72 84  Resp: 20 20 20 20   Temp: 97.8 F (36.6 C)  97.8 F (36.6 C) 98.2 F (36.8 C)  TempSrc: Oral  Oral Oral  SpO2: 94% 95% 97% 97%  Weight:    (!) 168.3 kg (371 lb)    Intake/Output Summary (Last 24 hours) at 06/11/16 1021 Last data filed at 06/11/16 0857  Gross per 24 hour  Intake              840 ml  Output             2600 ml  Net            -1760 ml     Wt Readings from Last 3 Encounters:  06/11/16 (!) 168.3 kg (371 lb)  05/25/16 (!) 171.5 kg (378 lb)  04/20/16 (!) 167.4  kg (369 lb)     Exam  General: Alert and oriented x 3, NAD  HEENT:  PERRLA, EOMI, Anicteric Sclera, mucous membranes moist.   Neck: Supple, no JVD, no masses  Cardiovascular: S1 S2 auscultated, no rubs, murmurs or gallops. Regular rate and rhythm.  Respiratory: Clear to auscultation bilaterally, no wheezing, rales or rhonchi  Gastrointestinal: Soft, nontender, nondistended, + bowel sounds  Ext: no cyanosis clubbing, 2+ brawny edema, erythematousm scabs, foul smel   Neuro: AAOx3, Cr N's II- XII. Strength 5/5 upper and lower extremities bilaterally  Skin: No rashes  Psych: Normal affect and demeanor, alert and oriented x3    Data Reviewed:  I have personally reviewed following labs and imaging studies  Micro Results No results found for this or any previous visit (from the past 240 hour(s)).  Radiology Reports Dg Chest 2 View  Result Date: 06/09/2016 CLINICAL DATA:  Shortness of breath, leg swelling for 1 week EXAM: CHEST  2 VIEW COMPARISON:  Chest x-ray of 07/08/2015 FINDINGS: The patient could not stand and the patient's chin overlies the upper lung feels obscuring detail. However on the best images possible, no pneumonia is seen. No effusion is noted. Mediastinal and hilar contours are unremarkable. Mild pulmonary vascular congestion cannot be excluded. The heart is mildly enlarged. No bony abnormality is seen. IMPRESSION: 1. Cardiomegaly.  Cannot exclude mild pulmonary vascular congestion. 2. No pneumonia or effusion. Electronically Signed   By: 07/10/2015 M.D.   On: 06/09/2016 16:10   06/11/2016 Renal  Result Date: 06/10/2016 CLINICAL DATA:  Acute onset of renal insufficiency. Initial encounter. EXAM: RENAL / URINARY TRACT ULTRASOUND COMPLETE COMPARISON:  CT of the abdomen and pelvis performed 09/15/2015 FINDINGS: Right Kidney: Length: 13.8 cm. Echogenicity within normal limits. No mass or hydronephrosis visualized. Left Kidney: Length: 13.4 cm. Echogenicity within normal limits.  No mass or hydronephrosis visualized. Bladder: Appears normal for degree of bladder distention. IMPRESSION: Unremarkable renal ultrasound.  No evidence of hydronephrosis. Electronically Signed   By: 11/15/2015 M.D.   On: 06/10/2016 03:28    Lab Data:  CBC:  Recent Labs Lab 06/09/16 1501 06/11/16 0217  WBC 13.8* 13.5*  HGB 10.6* 10.4*  HCT 34.8* 34.3*  MCV 77.7* 77.4*  PLT 319 332   Basic Metabolic Panel:  Recent Labs Lab 06/09/16 1501 06/11/16 0217  NA 139 138  K 3.0* 3.6  CL 100* 97*  CO2 28 29  GLUCOSE 148* 205*  BUN 9 12  CREATININE 1.37* 1.39*  CALCIUM 8.8* 8.7*   GFR: Estimated Creatinine Clearance: 86.5 mL/min (by C-G formula based on SCr of 1.39 mg/dL (H)). Liver Function Tests: No results for input(s): AST, ALT, ALKPHOS, BILITOT, PROT, ALBUMIN in the last 168 hours. No results for input(s): LIPASE, AMYLASE in the last 168 hours. No results for input(s): AMMONIA in the last 168 hours. Coagulation Profile: No results for input(s): INR, PROTIME in the last 168 hours. Cardiac Enzymes:  Recent Labs Lab 06/10/16 1300 06/10/16 1541 06/10/16 2137  TROPONINI 0.06* 0.03* 0.05*   BNP (last 3 results) No results for input(s): PROBNP in the last 8760 hours. HbA1C:  Recent Labs  06/10/16 1541  HGBA1C 8.1*   CBG:  Recent Labs Lab 06/10/16 1132 06/10/16 1628 06/10/16 2141 06/11/16 0650  GLUCAP 337* 299* 266* 215*   Lipid Profile: No results for input(s): CHOL, HDL, LDLCALC, TRIG, CHOLHDL, LDLDIRECT in the last 72 hours. Thyroid Function Tests: No results for input(s): TSH, T4TOTAL, FREET4, T3FREE, THYROIDAB in the last 72 hours. Anemia Panel: No results for input(s): VITAMINB12, FOLATE, FERRITIN, TIBC, IRON, RETICCTPCT in the last 72 hours. Urine analysis:    Component Value Date/Time   COLORURINE AMBER (A) 06/10/2016 0033   APPEARANCEUR CLEAR 06/10/2016 0033   LABSPEC 1.020 06/10/2016 0033   PHURINE 7.5 06/10/2016 0033   GLUCOSEU NEGATIVE  06/10/2016 0033   HGBUR NEGATIVE 06/10/2016 0033   BILIRUBINUR NEGATIVE 06/10/2016 0033   KETONESUR NEGATIVE 06/10/2016 0033   PROTEINUR 30 (A) 06/10/2016 0033   UROBILINOGEN 1.0 11/27/2014 0755   NITRITE NEGATIVE 06/10/2016 0033   LEUKOCYTESUR NEGATIVE 06/10/2016 0033     Sanja Elizardo M.D. [facility] 06/11/2016, 10:21 AM  Pager: [phone] Between 7am to 7pm - call Pager - [phone]  After 7pm go to [email or web address] - password [email or web address]  Call night coverage person covering after 7pm

## 2016-06-11 NOTE — Progress Notes (Signed)
  Echocardiogram 2D Echocardiogram has been performed.  Arvil Chaco 06/11/2016, 10:36 AM

## 2016-06-11 NOTE — Consult Note (Signed)
Primary cardiologist: Dr Stanford Breed  HPI: Patient is a 64 year old male with past medical history of DM, morbid obesity, coronary artery disease, permanent atrial fibrillation, chronic diastolic congestive heart failure, chronic stage III kidney disease for evaluation of acute on chronic diastolic congestive heart failure. Patient with history of atrial fibrillation with successful cardioversions. However more recently has not held sinus rhythm and is treated with rate control and anticoagulation. Patient had PCI of his right coronary artery in August 2012 in [place]. Patient's last cardiac catheterization in July 2015 showed a 40-50% mid LAD, 50% circumflex and 30% RCA. Ejection fraction 65%. Patient did develop contrast nephropathy. Echocardiogram May 2016 showed normal LV systolic function and mild left atrial enlargement. Patient admitted on November 29 with increasing weight gain, lower extremity edema and felt to have acute on chronic diastolic congestive heart failure. Cardiology now asked to evaluate. Patient states that for the 2 weeks prior to admission he gained approximately 20 pounds. He had increasing dyspnea on exertion, lower extremity edema and lower extremity weeping. No chest pain or syncope. He has had some improvement since admission.   Medications Prior to Admission  Medication Sig Dispense Refill  . atorvastatin (LIPITOR) 80 MG tablet Take 1 tablet (80 mg total) by mouth daily. 30 tablet 10  . furosemide (LASIX) 80 MG tablet Take 1 tablet (80 mg total) by mouth 2 (two) times daily. 180 tablet 3  . gabapentin (NEURONTIN) 300 MG capsule Take 2 capsules (600 mg total) by mouth 2 (two) times daily. 60 capsule 3  . Hypromellose (NATURAL BALANCE TEARS OP) Place 1 drop into both eyes daily as needed (for dry eyes).     . Insulin Glargine (TOUJEO SOLOSTAR) 300 UNIT/ML SOPN Inject 80 Units into the skin daily. 30 mL 2  . Insulin Lispro (HUMALOG KWIKPEN) 200 UNIT/ML SOPN Inject 1 each  into the skin 3 (three) times daily. Inject 80-100 units three times daily depending on meal size. 45 mL 2  . metoprolol (LOPRESSOR) 50 MG tablet Take 50 mg by mouth 2 (two) times daily. TWO TABLETS TWICE A DAY    . Multiple Vitamin (MULTIVITAMIN WITH MINERALS) TABS Take 1 tablet by mouth daily.    [name] oxyCODONE 20 MG TABS Take 1 tablet (20 mg total) by mouth every 6 (six) hours as needed for severe pain. (Patient taking differently: Take 20-30 mg by mouth every 6 (six) hours as needed for severe pain. ) 30 tablet 0  . rivaroxaban (XARELTO) 20 MG TABS tablet Take 1 tablet (20 mg total) by mouth daily with supper. 30 tablet 3  . spironolactone (ALDACTONE) 25 MG tablet Take 0.5 tablets (12.5 mg total) by mouth daily. 90 tablet 3  . BD INSULIN SYRINGE ULTRAFINE 31G X 15/64" 0.3 ML MISC USE TO IJNECT INSULIN 3 TIMES DAILY.  5  . Blood Glucose Monitoring Suppl (ACCU-CHEK AVIVA PLUS) w/Device KIT Use to check blood sugar 6 times per day dx code E11.65 1 kit 0  . hydrocerin (EUCERIN) CREA Apply 1 application topically 3 (three) times daily. legs (Patient not taking: Reported on 06/09/2016) 454 g 3  . KLOR-CON M20 20 MEQ tablet     . lisinopril (PRINIVIL,ZESTRIL) 10 MG tablet     . mupirocin cream (BACTROBAN) 2 % Apply topically 2 (two) times daily. (Patient not taking: Reported on 06/09/2016) 15 g 0    No Known Allergies  Past Medical History:  Diagnosis Date  . Arthritis    "hands and lower back" (09/19/2014)  .  CAD (coronary artery disease)    a. s/p PCI to RCA in 2012. b. prior cath in 01/2014 with elevated L/RH pressures, mild-mod CAD of LAD/LCx with patent RCA, normal EF, c/b CIN/CHF.  [name] Carpal tunnel syndrome, bilateral   . Cellulitis   . Chronic diastolic CHF (congestive heart failure) ([place])   . Chronic kidney disease (CKD), stage III (moderate)   . Chronic lower back pain   . History of blood transfusion ~ 1954   "related to OR"  . Hyperlipidemia   . Hypertension   . Hypoxia    a.  Qualified for home O2 at [place] in 09/2014.  [name] Lower GI bleed   . Microcytic anemia   . Morbid obesity ([place])   . Neuropathy ([place])   . OSA (obstructive sleep apnea)    "I wear nasal prongs; haven't been using prongs recently" (09/19/2014)  . PAF (paroxysmal atrial fibrillation) ([place])    TEE DCCV 09/23/2014  . Physical deconditioning   . Pilonidal cyst 1980's; 01/25/2013  . Scrotal abscess   . Type II diabetes mellitus ([place])     Past Surgical History:  Procedure Laterality Date  . ABDOMINAL SURGERY  ~ 1954   BENIGN TUMOR REMOVED  . APPENDECTOMY    . CARDIOVERSION  2010   Archie Endo 09/19/2014  . CARDIOVERSION N/A 09/23/2014   Procedure: CARDIOVERSION;  Surgeon: Lelon Perla, MD;  Location: [facility] ENDOSCOPY;  Service: Cardiovascular;  Laterality: N/A;  . CARDIOVERSION N/A 12/11/2014   Procedure: CARDIOVERSION;  Surgeon: Sanda Klein, MD;  Location: [place] ENDOSCOPY;  Service: Cardiovascular;  Laterality: N/A;  . CATARACT EXTRACTION W/PHACO Right 11/15/2012   Procedure: CATARACT EXTRACTION PHACO AND INTRAOCULAR LENS PLACEMENT ([place]);  Surgeon: Adonis Brook, MD;  Location: [place];  Service: Ophthalmology;  Laterality: Right;  . CATARACT EXTRACTION W/PHACO Left 11/29/2012   Procedure: CATARACT EXTRACTION PHACO AND INTRAOCULAR LENS PLACEMENT (IOC);  Surgeon: Adonis Brook, MD;  Location: [place];  Service: Ophthalmology;  Laterality: Left;  . CORONARY ANGIOPLASTY WITH STENT PLACEMENT  August 2012   RCA DES - [facility]  . DEBRIDEMENT  FOOT Left    debriding diabetic foot ulcers  . EYE SURGERY    . FOREIGN BODY REMOVAL Right 2014   heel,  splinter removal   . LEFT AND RIGHT HEART CATHETERIZATION WITH CORONARY ANGIOGRAM N/A 01/31/2014   Procedure: LEFT AND RIGHT HEART CATHETERIZATION WITH CORONARY ANGIOGRAM;  Surgeon: Blane Ohara, MD;  Location: [facility] CATH LAB;  Service: Cardiovascular;  Laterality: N/A;  . PARS PLANA VITRECTOMY Left 06/05/2013   Procedure: PARS PLANA VITRECTOMY  WITH 23 GAUGE with Endolaser(constellation);  Surgeon: Adonis Brook, MD;  Location: [place];  Service: Ophthalmology;  Laterality: Left;  . PILONIDAL CYST EXCISION N/A 01/08/2013   Procedure: CYST EXCISION PILONIDAL EXTENSIVE;  Surgeon: Ralene Ok, MD;  Location: [place];  Service: General;  Laterality: N/A;  . PILONIDAL CYST EXCISION  1980's   "in [place]"  . TEE WITHOUT CARDIOVERSION N/A 09/23/2014   Procedure: TRANSESOPHAGEAL ECHOCARDIOGRAM (TEE);  Surgeon: Lelon Perla, MD;  Location: [facility] ENDOSCOPY;  Service: Cardiovascular;  Laterality: N/A;  . TONSILLECTOMY      Social History   Social History  . Marital status: Single    Spouse name: N/A  . Number of children: Y  . Years of education: N/A   Occupational History  . disabled truck driver    Social History Main Topics  . Smoking status: Former Smoker    Packs/day: 1.00  Years: 20.00    Types: Cigarettes    Quit date: 07/13/1983  . Smokeless tobacco: Never Used  . Alcohol use No  . Drug use: No  . Sexual activity: Not Currently   Other Topics Concern  . Not on file   Social History Narrative   Pt is single.   Lives with sister.   Has children.   Was adopted.   Daily cafffiene-2 cups of coffee and 2 sodas per day          Family History  Problem Relation Age of Onset  . Adopted: Yes  . Other Other     PT ADOPTED    ROS:  no fevers or chills, productive cough, hemoptysis, dysphasia, odynophagia, melena, hematochezia, dysuria, hematuria, rash, seizure activity, claudication. Remaining systems are negative.  Physical Exam:   Blood pressure (!) 144/53, pulse 87, temperature 97.8 F (36.6 C), temperature source Oral, resp. rate 20, weight (!) 371 lb (168.3 kg), SpO2 94 %.  General:  Well developed/morbidly obese in NAD Skin warm/dry Patient not depressed No peripheral clubbing Back-normal HEENT-normal/normal eyelids Neck supple/normal carotid upstroke bilaterally; no bruits; no thyromegaly chest - CTA/  normal expansion CV - irregular/normal S1 and S2; no murmurs, rubs or gallops;  PMI nondisplaced Abdomen -difficult due to obesity, NT/ND, no mass, + bowel sounds Ext-2+ edema, weeping noted; excoriations Neuro-grossly nonfocal  ECG Atrial fibrillation, low voltage, nonspecific ST changes    Results for orders placed or performed during the hospital encounter of 06/09/16 (from the past 48 hour(s))  Basic metabolic panel     Status: Abnormal   Collection Time: 06/09/16  3:01 PM  Result Value Ref Range   Sodium 139 135 - 145 mmol/L   Potassium 3.0 (L) 3.5 - 5.1 mmol/L   Chloride 100 (L) 101 - 111 mmol/L   CO2 28 22 - 32 mmol/L   Glucose, Bld 148 (H) 65 - 99 mg/dL   BUN 9 6 - 20 mg/dL   Creatinine, Ser 1.37 (H) 0.61 - 1.24 mg/dL   Calcium 8.8 (L) 8.9 - 10.3 mg/dL   GFR calc non Af Amer 53 (L) >60 mL/min   GFR calc Af Amer >60 >60 mL/min    Comment: (NOTE) The eGFR has been calculated using the CKD EPI equation. This calculation has not been validated in all clinical situations. eGFR's persistently <60 mL/min signify possible Chronic Kidney Disease.    Anion gap 11 5 - 15  CBC     Status: Abnormal   Collection Time: 06/09/16  3:01 PM  Result Value Ref Range   WBC 13.8 (H) 4.0 - 10.5 K/uL   RBC 4.48 4.22 - 5.81 MIL/uL   Hemoglobin 10.6 (L) 13.0 - 17.0 g/dL   HCT 34.8 (L) 39.0 - 52.0 %   MCV 77.7 (L) 78.0 - 100.0 fL   MCH 23.7 (L) 26.0 - 34.0 pg   MCHC 30.5 30.0 - 36.0 g/dL   RDW 17.9 (H) 11.5 - 15.5 %   Platelets 319 150 - 400 K/uL  I-stat troponin, ED     Status: None   Collection Time: 06/09/16  3:13 PM  Result Value Ref Range   Troponin i, poc 0.00 0.00 - 0.08 ng/mL   Comment 3            Comment: Due to the release kinetics of cTnI, a negative result within the first hours of the onset of symptoms does not rule out myocardial infarction with certainty. If myocardial infarction is still  suspected, repeat the test at appropriate intervals.   Brain natriuretic peptide      Status: Abnormal   Collection Time: 06/09/16 10:41 PM  Result Value Ref Range   B Natriuretic Peptide 146.1 (H) 0.0 - 100.0 pg/mL  I-Stat CG4 Lactic Acid, ED     Status: Abnormal   Collection Time: 06/09/16 10:44 PM  Result Value Ref Range   Lactic Acid, Venous 2.56 (HH) 0.5 - 1.9 mmol/L   Comment NOTIFIED PHYSICIAN   Urinalysis, Routine w reflex microscopic (not at [facility])     Status: Abnormal   Collection Time: 06/10/16 12:33 AM  Result Value Ref Range   Color, Urine AMBER (A) YELLOW    Comment: BIOCHEMICALS MAY BE AFFECTED BY COLOR   APPearance CLEAR CLEAR   Specific Gravity, Urine 1.020 1.005 - 1.030   pH 7.5 5.0 - 8.0   Glucose, UA NEGATIVE NEGATIVE mg/dL   Hgb urine dipstick NEGATIVE NEGATIVE   Bilirubin Urine NEGATIVE NEGATIVE   Ketones, ur NEGATIVE NEGATIVE mg/dL   Protein, ur 30 (A) NEGATIVE mg/dL   Nitrite NEGATIVE NEGATIVE   Leukocytes, UA NEGATIVE NEGATIVE  Urine microscopic-add on     Status: Abnormal   Collection Time: 06/10/16 12:33 AM  Result Value Ref Range   Squamous Epithelial / LPF 0-5 (A) NONE SEEN   WBC, UA 0-5 0 - 5 WBC/hpf   RBC / HPF 0-5 0 - 5 RBC/hpf   Bacteria, UA RARE (A) NONE SEEN  Creatinine, urine, random     Status: None   Collection Time: 06/10/16 12:35 AM  Result Value Ref Range   Creatinine, Urine 187.19 mg/dL  Sodium, urine, random     Status: None   Collection Time: 06/10/16 12:35 AM  Result Value Ref Range   Sodium, Ur 100 mmol/L  Lactic acid, plasma     Status: Abnormal   Collection Time: 06/10/16  1:44 AM  Result Value Ref Range   Lactic Acid, Venous 2.3 (HH) 0.5 - 1.9 mmol/L    Comment: CRITICAL RESULT CALLED TO, READ BACK BY AND VERIFIED WITH: PRUETT,K RN 06/10/2016 0222 JORDANS   I-Stat CG4 Lactic Acid, ED     Status: Abnormal   Collection Time: 06/10/16  1:50 AM  Result Value Ref Range   Lactic Acid, Venous 2.53 (HH) 0.5 - 1.9 mmol/L   Comment NOTIFIED PHYSICIAN   Lactic acid, plasma     Status: Abnormal   Collection  Time: 06/10/16  4:10 AM  Result Value Ref Range   Lactic Acid, Venous 3.5 (HH) 0.5 - 1.9 mmol/L    Comment: CRITICAL RESULT CALLED TO, READ BACK BY AND VERIFIED WITH: PRUETT,K RN 06/10/2016 0510 JORDANS   Glucose, capillary     Status: Abnormal   Collection Time: 06/10/16 11:32 AM  Result Value Ref Range   Glucose-Capillary 337 (H) 65 - 99 mg/dL   Comment 1 Notify RN    Comment 2 Document in Chart   Troponin I     Status: Abnormal   Collection Time: 06/10/16  1:00 PM  Result Value Ref Range   Troponin I 0.06 (HH) <0.03 ng/mL    Comment: CRITICAL RESULT CALLED TO, READ BACK BY AND VERIFIED WITH: JACOBS,A. RN @ 1409 05/31/16 BY EDENS,C. 06/10/16   Troponin I     Status: Abnormal   Collection Time: 06/10/16  3:41 PM  Result Value Ref Range   Troponin I 0.03 (HH) <0.03 ng/mL    Comment: CRITICAL VALUE NOTED.  VALUE IS CONSISTENT WITH  PREVIOUSLY REPORTED AND CALLED VALUE.  Hemoglobin A1c     Status: Abnormal   Collection Time: 06/10/16  3:41 PM  Result Value Ref Range   Hgb A1c MFr Bld 8.1 (H) 4.8 - 5.6 %    Comment: (NOTE)         Pre-diabetes: 5.7 - 6.4         Diabetes: >6.4         Glycemic control for adults with diabetes: <7.0    Mean Plasma Glucose 186 mg/dL    Comment: (NOTE) Performed At: [facility] [place], [place] [id number] Lindon Romp MD KC:[id number]   Glucose, capillary     Status: Abnormal   Collection Time: 06/10/16  4:28 PM  Result Value Ref Range   Glucose-Capillary 299 (H) 65 - 99 mg/dL   Comment 1 Notify RN    Comment 2 Document in Chart   Troponin I     Status: Abnormal   Collection Time: 06/10/16  9:37 PM  Result Value Ref Range   Troponin I 0.05 (HH) <0.03 ng/mL    Comment: CRITICAL VALUE NOTED.  VALUE IS CONSISTENT WITH PREVIOUSLY REPORTED AND CALLED VALUE.  Glucose, capillary     Status: Abnormal   Collection Time: 06/10/16  9:41 PM  Result Value Ref Range   Glucose-Capillary 266 (H) 65 - 99 mg/dL  Basic  metabolic panel     Status: Abnormal   Collection Time: 06/11/16  2:17 AM  Result Value Ref Range   Sodium 138 135 - 145 mmol/L   Potassium 3.6 3.5 - 5.1 mmol/L   Chloride 97 (L) 101 - 111 mmol/L   CO2 29 22 - 32 mmol/L   Glucose, Bld 205 (H) 65 - 99 mg/dL   BUN 12 6 - 20 mg/dL   Creatinine, Ser 1.39 (H) 0.61 - 1.24 mg/dL   Calcium 8.7 (L) 8.9 - 10.3 mg/dL   GFR calc non Af Amer 52 (L) >60 mL/min   GFR calc Af Amer >60 >60 mL/min    Comment: (NOTE) The eGFR has been calculated using the CKD EPI equation. This calculation has not been validated in all clinical situations. eGFR's persistently <60 mL/min signify possible Chronic Kidney Disease.    Anion gap 12 5 - 15  CBC     Status: Abnormal   Collection Time: 06/11/16  2:17 AM  Result Value Ref Range   WBC 13.5 (H) 4.0 - 10.5 K/uL   RBC 4.43 4.22 - 5.81 MIL/uL   Hemoglobin 10.4 (L) 13.0 - 17.0 g/dL   HCT 34.3 (L) 39.0 - 52.0 %   MCV 77.4 (L) 78.0 - 100.0 fL   MCH 23.5 (L) 26.0 - 34.0 pg   MCHC 30.3 30.0 - 36.0 g/dL   RDW 17.7 (H) 11.5 - 15.5 %   Platelets 332 150 - 400 K/uL  Brain natriuretic peptide     Status: Abnormal   Collection Time: 06/11/16  2:17 AM  Result Value Ref Range   B Natriuretic Peptide 196.1 (H) 0.0 - 100.0 pg/mL  Glucose, capillary     Status: Abnormal   Collection Time: 06/11/16  6:50 AM  Result Value Ref Range   Glucose-Capillary 215 (H) 65 - 99 mg/dL  Glucose, capillary     Status: Abnormal   Collection Time: 06/11/16 11:12 AM  Result Value Ref Range   Glucose-Capillary 315 (H) 65 - 99 mg/dL   Comment 1 Notify RN    Comment 2 Document in Chart  Dg Chest 2 View  Result Date: 06/09/2016 CLINICAL DATA:  Shortness of breath, leg swelling for 1 week EXAM: CHEST  2 VIEW COMPARISON:  Chest x-ray of 07/08/2015 FINDINGS: The patient could not stand and the patient's chin overlies the upper lung feels obscuring detail. However on the best images possible, no pneumonia is seen. No effusion is noted.  Mediastinal and hilar contours are unremarkable. Mild pulmonary vascular congestion cannot be excluded. The heart is mildly enlarged. No bony abnormality is seen. IMPRESSION: 1. Cardiomegaly.  Cannot exclude mild pulmonary vascular congestion. 2. No pneumonia or effusion. Electronically Signed   By: Ivar Drape M.D.   On: 06/09/2016 16:10   US Renal  Result Date: 06/10/2016 CLINICAL DATA:  Acute onset of renal insufficiency. Initial encounter. EXAM: RENAL / URINARY TRACT ULTRASOUND COMPLETE COMPARISON:  CT of the abdomen and pelvis performed 09/15/2015 FINDINGS: Right Kidney: Length: 13.8 cm. Echogenicity within normal limits. No mass or hydronephrosis visualized. Left Kidney: Length: 13.4 cm. Echogenicity within normal limits. No mass or hydronephrosis visualized. Bladder: Appears normal for degree of bladder distention. IMPRESSION: Unremarkable renal ultrasound.  No evidence of hydronephrosis. Electronically Signed   By: Garald Balding M.D.   On: 06/10/2016 03:28    Assessment/Plan 1 acute on chronic diastolic congestive heart failure-patient is improving but remains volume overloaded. Much of volume excess is also likely related to right heart failure from morbid obesity and obstructive sleep apnea. Continue Lasix at present dose and follow renal function. Careful with diuresis as he has had significant renal insufficiency previously. We discussed the importance of weight loss and compliance with CPAP.   2 Atrial fibrillation-continue metoprolol for rate control. Continue xarelto.  3 chronic stage III kidney disease-follow renal function closely with diuresis.  4 morbid obesity-we discussed the importance of weight loss.  5 coronary artery disease-continue statin.  6 lower extremity cellulitis-would ask wound care to see.  Kirk Ruths MD 06/11/2016, 2:28 PM

## 2016-06-11 NOTE — Evaluation (Addendum)
Physical Therapy Evaluation & Discharge Patient Details Name: [name] MRN: [id number] DOB: 12-23-1950 Today's Date: 06/11/2016   History of Present Illness  Patient is a 65 y/o male admitted with SOB and worsening edema.  PMH positiv for DM 2, chronic afib on Xarelto, OSA (noncompliant w/ cpap), HTN  and CAD sp PCI to RCA in 2012, morbid obesity, venous stasis, Chronic diastolic CHF, chronic low back pain chronic hypoxia on oxygen, CKD stage III  Clinical Impression  Patient presents at his baseline for mobility, though does report limitations in standing and walking tolerance due to pain in lower back and neuropathy in feet.  Reports had just finished HHPT/OT and plans to continue his HEP on his own.  Not currently interested in outpatient PT.  Feel no further acute skilled PT needs as able to ambulate in his room safely.  Will d/c PT.     Follow Up Recommendations No PT follow up    Equipment Recommendations  None recommended by PT    Recommendations for Other Services       Precautions / Restrictions Precautions Precautions: Fall      Mobility  Bed Mobility               General bed mobility comments: up in chair  Transfers Overall transfer level: Modified independent                  Ambulation/Gait Ambulation/Gait assistance: Independent Ambulation Distance (Feet): 20 Feet Assistive device: None Gait Pattern/deviations: Step-through pattern;Wide base of support;Trunk flexed     General Gait Details: no UE support, but with flexed posture  Stairs            Wheelchair Mobility    Modified Rankin (Stroke Patients Only)       Balance Overall balance assessment: Modified Independent         Standing balance support: No upper extremity supported Standing balance-Leahy Scale: Good                               Pertinent Vitals/Pain Pain Assessment: Faces Faces Pain Scale: Hurts even more Pain Location: feet due to  neuropathy and back pain Pain Descriptors / Indicators: Burning;Discomfort;Aching Pain Intervention(s): Monitored during session;Repositioned;Limited activity within patient's tolerance    Home Living Family/patient expects to be discharged to:: Private residence Living Arrangements: Other relatives (sister) Available Help at Discharge: Family;Available PRN/intermittently Type of Home: House Home Access: Stairs to enter Entrance Stairs-Rails: Lawyer of Steps: 5 Home Layout: One level Home Equipment: Shower seat;Hand held shower head Additional Comments: pt lives with his sister, they keep an eye on each other. Drives, does his own shopping and self care, sleeps in a recliner, does do cooking and washing dishes with rest breaks due to back pain with standing    Prior Function Level of Independence: Independent               Hand Dominance        Extremity/Trunk Assessment               Lower Extremity Assessment: RLE deficits/detail;LLE deficits/detail RLE Deficits / Details: chronic edema in lower legs with oozing sores, strength grossly WFL, but limited ankle DF/PF and insensate feet LLE Deficits / Details: chronic edema in lower legs with oozing sores, strength grossly WFL, but limited ankle DF/PF and insensate feet     Communication   Communication: No difficulties  Cognition Arousal/Alertness: Awake/alert Behavior During Therapy: WFL for tasks assessed/performed Overall Cognitive Status: Within Functional Limits for tasks assessed                      General Comments General comments (skin integrity, edema, etc.): patient flexed in upper spine with flattened lordosis and forward head; educated in fall prevention with footwear and lighting esp in light of neuropathy    Exercises     Assessment/Plan    PT Assessment Patent does not need any further PT services  PT Problem List            PT Treatment Interventions       PT Goals (Current goals can be found in the Care Plan section)  Acute Rehab PT Goals PT Goal Formulation: All assessment and education complete, DC therapy    Frequency     Barriers to discharge        Co-evaluation               End of Session   Activity Tolerance: Patient limited by pain Patient left: in chair;with call bell/phone within reach           Time: 1036-1056 PT Time Calculation (min) (ACUTE ONLY): 20 min   Charges:   PT Evaluation $PT Eval Low Complexity: 1 Procedure     PT G CodesElray Mcgregor 06-22-2016, 12:34 PM Sheran Lawless, PT [phone] 06-22-16

## 2016-06-11 NOTE — Evaluation (Signed)
Occupational Therapy Evaluation and Discharge Patient Details Name: [name] MRN: [id number] DOB: 06-22-1951 Today's Date: 06/11/2016    History of Present Illness Patient is a 65 y/o male admitted with SOB and worsening edema.  PMH positiv for DM 2, chronic afib on Xarelto, OSA (noncompliant w/ cpap), HTN  and CAD sp PCI to RCA in 2012, morbid obesity, venous stasis, Chronic diastolic CHF, chronic low back pain chronic hypoxia on oxygen, CKD stage III   Clinical Impression   Pt reports he was independent with ADL PTA. Currently pt overall mod I for ADL and functional mobility. Pt reports he just finished with HHPT/OT and has no equipment of follow up needs; has HEP that he completes at home. Educated pt on fall prevention and energy conservation strategies. Pt planning to d/c home with intermittent supervision from family. No further acute OT needs identified; signing off at this time. Please re-consult if needs change. Thank you for this referral.    Follow Up Recommendations  No OT follow up;Supervision - Intermittent    Equipment Recommendations  None recommended by OT    Recommendations for Other Services       Precautions / Restrictions Precautions Precautions: None Restrictions Weight Bearing Restrictions: No      Mobility Bed Mobility Overal bed mobility: Modified Independent             General bed mobility comments: HOB elevated  Transfers Overall transfer level: Modified independent Equipment used: None                  Balance Overall balance assessment: No apparent balance deficits (not formally assessed)         Standing balance support: No upper extremity supported Standing balance-Leahy Scale: Good                              ADL Overall ADL's : Modified independent;At baseline                                       General ADL Comments: Educated pt on fall prevention and energy conservation  strategies; pt reports he currently uses many of these strategies at home already. Pt reports he just finished with HHPT/OT and has HEP that he completes at home; has all equipment that he needs.     Vision Vision Assessment?: No apparent visual deficits   Perception     Praxis      Pertinent Vitals/Pain Pain Assessment: Faces Faces Pain Scale: Hurts little more Pain Location: back, bil LEs Pain Descriptors / Indicators: Aching;Discomfort Pain Intervention(s): Monitored during session;Limited activity within patient's tolerance     Hand Dominance Right   Extremity/Trunk Assessment Upper Extremity Assessment Upper Extremity Assessment: Overall WFL for tasks assessed   Lower Extremity Assessment Lower Extremity Assessment: Defer to PT evaluation RLE Deficits / Details: chronic edema in lower legs with oozing sores, strength grossly WFL, but limited ankle DF/PF and insensate feet RLE Sensation: history of peripheral neuropathy LLE Deficits / Details: chronic edema in lower legs with oozing sores, strength grossly WFL, but limited ankle DF/PF and insensate feet LLE Sensation: history of peripheral neuropathy   Cervical / Trunk Assessment Cervical / Trunk Assessment: Other exceptions Cervical / Trunk Exceptions: morbid obesity   Communication Communication Communication: No difficulties   Cognition Arousal/Alertness: Awake/alert Behavior During Therapy: WFL for  tasks assessed/performed Overall Cognitive Status: Within Functional Limits for tasks assessed                     General Comments       Exercises       Shoulder Instructions      Home Living Family/patient expects to be discharged to:: Private residence Living Arrangements: Other relatives (sister) Available Help at Discharge: Family;Available PRN/intermittently Type of Home: Mobile home Home Access: Stairs to enter Entrance Stairs-Number of Steps: 5 Entrance Stairs-Rails: Left;Right Home Layout:  One level     Bathroom Shower/Tub: Walk-in Pensions consultant: Standard     Home Equipment: Shower seat;Hand held shower head;Grab bars - tub/shower   Additional Comments: pt lives with his sister, they keep an eye on each other. Drives, does his own shopping and self care, sleeps in a recliner, does do cooking and washing dishes with rest breaks due to back pain with standing      Prior Functioning/Environment Level of Independence: Independent                 OT Problem List:     OT Treatment/Interventions:      OT Goals(Current goals can be found in the care plan section) Acute Rehab OT Goals Patient Stated Goal: return home OT Goal Formulation: All assessment and education complete, DC therapy  OT Frequency:     Barriers to D/C:            Co-evaluation              End of Session    Activity Tolerance: Patient tolerated treatment well Patient left: in bed;with call bell/phone within reach (sitting EOB)   Time: 6415-8309 OT Time Calculation (min): 10 min Charges:  OT General Charges $OT Visit: 1 Procedure OT Evaluation $OT Eval Low Complexity: 1 Procedure G-Codes:     Gaye Alken M.S., OTR/L Pager: [phone]  06/11/2016, 3:29 PM

## 2016-06-12 DIAGNOSIS — N179 Acute kidney failure, unspecified: Secondary | ICD-10-CM

## 2016-06-12 DIAGNOSIS — I4891 Unspecified atrial fibrillation: Secondary | ICD-10-CM

## 2016-06-12 LAB — BASIC METABOLIC PANEL
ANION GAP: 12 (ref 5–15)
BUN: 15 mg/dL (ref 6–20)
CALCIUM: 8.4 mg/dL — AB (ref 8.9–10.3)
CO2: 26 mmol/L (ref 22–32)
Chloride: 96 mmol/L — ABNORMAL LOW (ref 101–111)
Creatinine, Ser: 1.29 mg/dL — ABNORMAL HIGH (ref 0.61–1.24)
GFR, EST NON AFRICAN AMERICAN: 57 mL/min — AB (ref >60)
Glucose, Bld: 228 mg/dL — ABNORMAL HIGH (ref 65–99)
Potassium: 3.6 mmol/L (ref 3.5–5.1)
Sodium: 134 mmol/L — ABNORMAL LOW (ref 135–145)

## 2016-06-12 LAB — GLUCOSE, CAPILLARY
GLUCOSE-CAPILLARY: 278 mg/dL — AB (ref 65–99)
GLUCOSE-CAPILLARY: 278 mg/dL — AB (ref 65–99)
Glucose-Capillary: 209 mg/dL — ABNORMAL HIGH (ref 65–99)
Glucose-Capillary: 239 mg/dL — ABNORMAL HIGH (ref 65–99)

## 2016-06-12 LAB — CBC
HCT: 32.9 % — ABNORMAL LOW (ref 39.0–52.0)
HEMOGLOBIN: 10.2 g/dL — AB (ref 13.0–17.0)
MCH: 23.8 pg — ABNORMAL LOW (ref 26.0–34.0)
MCHC: 31 g/dL (ref 30.0–36.0)
MCV: 76.9 fL — ABNORMAL LOW (ref 78.0–100.0)
Platelets: 325 10*3/uL (ref 150–400)
RBC: 4.28 MIL/uL (ref 4.22–5.81)
RDW: 17.8 % — ABNORMAL HIGH (ref 11.5–15.5)
WBC: 14.8 10*3/uL — AB (ref 4.0–10.5)

## 2016-06-12 LAB — BRAIN NATRIURETIC PEPTIDE: B NATRIURETIC PEPTIDE 5: 129.7 pg/mL — AB (ref 0.0–100.0)

## 2016-06-12 MED ORDER — METOLAZONE 2.5 MG PO TABS
2.5000 mg | ORAL_TABLET | Freq: Once | ORAL | Status: AC
  Administered 2016-06-13: 2.5 mg via ORAL
  Filled 2016-06-12: qty 1

## 2016-06-12 NOTE — Progress Notes (Signed)
Patient Name: [name] Date of Encounter: 06/12/2016     Active Problems:   Hyperlipidemia   HYPOKALEMIA   Essential hypertension, benign   Atrial fibrillation   Obstructive sleep apnea- unable to tolerate c-pap   CAD S/P percutaneous coronary angioplasty   Chronic anticoagulation- Coumadin   CKD (chronic kidney disease) stage 3, GFR 30-59 ml/min   Acute on chronic diastolic heart failure (HCC)   Diabetes mellitus type 2 in obese (HCC)   Lactic acidosis   CHF exacerbation (HCC)    SUBJECTIVE  Dyspnea about the same. Lower extremity swelling improved but still present. Weeping sores are drying up.  CURRENT MEDS . aspirin EC  81 mg Oral Daily  . atorvastatin  80 mg Oral q1800  . doxycycline  100 mg Oral Q12H  . furosemide  80 mg Intravenous BID  . gabapentin  600 mg Oral BID  . insulin aspart  0-15 Units Subcutaneous TID WC  . insulin aspart  0-5 Units Subcutaneous QHS  . insulin aspart  4 Units Subcutaneous TID WC  . insulin glargine  80 Units Subcutaneous Daily  . metoprolol  50 mg Oral BID  . potassium chloride  40 mEq Oral BID  . rivaroxaban  20 mg Oral Q supper  . sodium chloride flush  3 mL Intravenous Q12H  . spironolactone  12.5 mg Oral Daily    OBJECTIVE  Vitals:   06/11/16 0514 06/11/16 1332 06/11/16 2020 06/12/16 0528  BP: (!) 116/45 (!) 144/53 (!) 114/46 (!) 126/59  Pulse: 84 87 91 87  Resp: 20 20  18   Temp: 98.2 F (36.8 C) 97.8 F (36.6 C) 98.4 F (36.9 C) 97.9 F (36.6 C)  TempSrc: Oral Oral Oral Oral  SpO2: 97% 94% 100% 99%  Weight: (!) 371 lb (168.3 kg)   (!) 375 lb 9.6 oz (170.4 kg)    Intake/Output Summary (Last 24 hours) at 06/12/16 0939 Last data filed at 06/12/16 0001  Gross per 24 hour  Intake                0 ml  Output              700 ml  Net             -700 ml   Filed Weights   06/09/16 1512 06/11/16 0514 06/12/16 0528  Weight: (!) 381 lb 8 oz (173 kg) (!) 371 lb (168.3 kg) (!) 375 lb 9.6 oz (170.4 kg)     PHYSICAL EXAM  General: Pleasant, morbidly obese man, NAD. Neuro: Alert and oriented X 3. Moves all extremities spontaneously. Psych: Normal affect. HEENT:  Normal  Neck: Supple without bruits or JVD. Lungs: scattered rales, distant breath sounds  Heart: RRR no s3, s4, or murmurs. distant Abdomen: Soft, non-tender, non-distended, BS + x 4.  Extremities: 1+ woody edema with healing sores below the knee.  Accessory Clinical Findings  CBC  Recent Labs  06/11/16 0217 06/12/16 0229  WBC 13.5* 14.8*  HGB 10.4* 10.2*  HCT 34.3* 32.9*  MCV 77.4* 76.9*  PLT 332 325   Basic Metabolic Panel  Recent Labs  06/11/16 0217 06/12/16 0229  NA 138 134*  K 3.6 3.6  CL 97* 96*  CO2 29 26  GLUCOSE 205* 228*  BUN 12 15  CREATININE 1.39* 1.29*  CALCIUM 8.7* 8.4*   Liver Function Tests No results for input(s): AST, ALT, ALKPHOS, BILITOT, PROT, ALBUMIN in the last 72 hours. No results for  input(s): LIPASE, AMYLASE in the last 72 hours. Cardiac Enzymes  Recent Labs  06/10/16 1300 06/10/16 1541 06/10/16 2137  TROPONINI 0.06* 0.03* 0.05*   BNP Invalid input(s): POCBNP D-Dimer No results for input(s): DDIMER in the last 72 hours. Hemoglobin A1C  Recent Labs  06/10/16 1541  HGBA1C 8.1*   Fasting Lipid Panel No results for input(s): CHOL, HDL, LDLCALC, TRIG, CHOLHDL, LDLDIRECT in the last 72 hours. Thyroid Function Tests No results for input(s): TSH, T4TOTAL, T3FREE, THYROIDAB in the last 72 hours.  Invalid input(s): FREET3  TELE  Atrial fib with a controlled VR  Radiology/Studies  Dg Chest 2 View  Result Date: 06/09/2016 CLINICAL DATA:  Shortness of breath, leg swelling for 1 week EXAM: CHEST  2 VIEW COMPARISON:  Chest x-ray of 07/08/2015 FINDINGS: The patient could not stand and the patient's chin overlies the upper lung feels obscuring detail. However on the best images possible, no pneumonia is seen. No effusion is noted. Mediastinal and hilar contours are  unremarkable. Mild pulmonary vascular congestion cannot be excluded. The heart is mildly enlarged. No bony abnormality is seen. IMPRESSION: 1. Cardiomegaly.  Cannot exclude mild pulmonary vascular congestion. 2. No pneumonia or effusion. Electronically Signed   By: Dwyane Dee M.D.   On: 06/09/2016 16:10   US Renal  Result Date: 06/10/2016 CLINICAL DATA:  Acute onset of renal insufficiency. Initial encounter. EXAM: RENAL / URINARY TRACT ULTRASOUND COMPLETE COMPARISON:  CT of the abdomen and pelvis performed 09/15/2015 FINDINGS: Right Kidney: Length: 13.8 cm. Echogenicity within normal limits. No mass or hydronephrosis visualized. Left Kidney: Length: 13.4 cm. Echogenicity within normal limits. No mass or hydronephrosis visualized. Bladder: Appears normal for degree of bladder distention. IMPRESSION: Unremarkable renal ultrasound.  No evidence of hydronephrosis. Electronically Signed   By: Roanna Raider M.D.   On: 06/10/2016 03:28    ASSESSMENT AND PLAN  1. Acute on chronic diastolic heart failure - his symptoms are improved. Weight is up from yesterday. He will need more diuresis.  2. Atrial fib - his rate is controlled. Continue xarelto and beta blocker. 3. Morbid obesity - this is his main problem. Needs dietary counseling. 4. Acute on chronic renal failure, stage 3 - creatinine is stable. Follow.  Sharlot Gowda Lidiya Reise,M.D.  06/12/2016 9:39 AMPatient ID: [name], male   DOB: 1950-12-23, 65 y.o.   MRN: [id number]

## 2016-06-12 NOTE — Progress Notes (Signed)
[facility]                                                                              Patient Demographics  [name], is a 65 y.o. male, DOB - [birth year], ZOX:[id number]  Admit date - 06/09/2016   Admitting Physician Therisa Doyne, MD  Outpatient Primary MD for the patient is [name], MD  Outpatient specialists:   LOS - 2  days    Chief Complaint  Patient presents with  . Shortness of Breath  . Leg Swelling       Brief summary   65 yo male with morbid obesity, DM2, Chronic Afib on Xarelto, CAD s/p PCI and diastolic HF admitted with several days of SOB with exertion, 20 lb weight gain and dark urine for acute exacerbation of diastolic heart failure.    Assessment & Plan   Acute on chronic diastolic heart failure (HCC) -  - continue IV lasix for diuresis,Negative balance of 2.2 L   - weight down 381-> 375lbs   - continue stritc I's and O's   - follow 2D echo  - Not on ACE/ARBi due to renal insuficiency - Cardiology consulted, will follow recommendations  .Atrial fibrillation - chronic -   continue metoprolol   - continue Xarelto CHA2DS2 vas score 4  .CKD (chronic kidney disease) stage 3, GFR 30-59 ml/min - Cr slightly up likely due to lasix diuresis   - renal US normal  .Diabetes mellitus type 2 in obese (HCC), uncontrolled   - increased to moderate sliding scale, added meal coverage, cont home lantus  .Essential hypertension, benign - continue home medication   .Hyperlipidemia - continue home medication  .Hypokalemia - replace and monitor  .Obstructive sleep apnea -  unable to tolerate c-pap, will hold off  .LE cellulitis:  - erythematous, foul smelling, placed on doxycyline   -Wound care consult placed   Code Status: full code DVT Prophylaxis:  xarelto Family Communication: Discussed in detail with the patient, all imaging results, lab results explained to the patient  Disposition Plan:   Time  Spent in minutes  25 minutes  Procedures:  None   Consultants:   cardiology  Antimicrobials:   Doxycycline    Medications  Scheduled Meds: . aspirin EC  81 mg Oral Daily  . atorvastatin  80 mg Oral q1800  . doxycycline  100 mg Oral Q12H  . furosemide  80 mg Intravenous BID  . gabapentin  600 mg Oral BID  . insulin aspart  0-15 Units Subcutaneous TID WC  . insulin aspart  0-5 Units Subcutaneous QHS  . insulin aspart  4 Units Subcutaneous TID WC  . insulin glargine  80 Units Subcutaneous Daily  . metoprolol  50 mg Oral BID  . potassium chloride  40 mEq Oral BID  . rivaroxaban  20 mg Oral Q supper  . sodium chloride flush  3 mL Intravenous Q12H  . spironolactone  12.5 mg Oral Daily   Continuous Infusions: PRN Meds:.sodium chloride, acetaminophen, morphine injection, ondansetron (ZOFRAN) IV, oxyCODONE, sodium chloride flush   Antibiotics   Anti-infectives    Start     Dose/Rate  Route Frequency Ordered Stop   06/11/16 1000  doxycycline (VIBRA-TABS) tablet 100 mg     100 mg Oral Every 12 hours 06/11/16 0730          Subjective:   [name] was seen and examined today. No chest pain, abdominal pain, N/V/D/C, new weakness, numbess, tingling. No acute events overnight.   Shortness of breath improving. LE edema still persisting  Objective:   Vitals:   06/11/16 0514 06/11/16 1332 06/11/16 2020 06/12/16 0528  BP: (!) 116/45 (!) 144/53 (!) 114/46 (!) 126/59  Pulse: 84 87 91 87  Resp: 20 20  18   Temp: 98.2 F (36.8 C) 97.8 F (36.6 C) 98.4 F (36.9 C) 97.9 F (36.6 C)  TempSrc: Oral Oral Oral Oral  SpO2: 97% 94% 100% 99%  Weight: (!) 168.3 kg (371 lb)   (!) 170.4 kg (375 lb 9.6 oz)    Intake/Output Summary (Last 24 hours) at 06/12/16 5465 Last data filed at 06/12/16 0001  Gross per 24 hour  Intake                0 ml  Output              700 ml  Net             -700 ml     Wt Readings from Last 3 Encounters:  06/12/16 (!) 170.4 kg (375 lb 9.6 oz)    05/25/16 (!) 171.5 kg (378 lb)  04/20/16 (!) 167.4 kg (369 lb)     Exam  General: Alert and oriented x 3, NAD  HEENT:    Neck:   Cardiovascular: S1 S2 clear, RRR  Respiratory: Clear to auscultation bilaterally, no wheezing, rales or rhonchi  Gastrointestinal: obese, Soft, nontender, nondistended, + bowel sounds  Ext: no cyanosis clubbing, 2+ brawny edema, erythematousm scabs, foul smel   Neuro: no focal neurological deficits   Skin: No rashes  Psych: Normal affect and demeanor, alert and oriented x3    Data Reviewed:  I have personally reviewed following labs and imaging studies  Micro Results No results found for this or any previous visit (from the past 240 hour(s)).  Radiology Reports Dg Chest 2 View  Result Date: 06/09/2016 CLINICAL DATA:  Shortness of breath, leg swelling for 1 week EXAM: CHEST  2 VIEW COMPARISON:  Chest x-ray of 07/08/2015 FINDINGS: The patient could not stand and the patient's chin overlies the upper lung feels obscuring detail. However on the best images possible, no pneumonia is seen. No effusion is noted. Mediastinal and hilar contours are unremarkable. Mild pulmonary vascular congestion cannot be excluded. The heart is mildly enlarged. No bony abnormality is seen. IMPRESSION: 1. Cardiomegaly.  Cannot exclude mild pulmonary vascular congestion. 2. No pneumonia or effusion. Electronically Signed   By: Dwyane Dee M.D.   On: 06/09/2016 16:10   US Renal  Result Date: 06/10/2016 CLINICAL DATA:  Acute onset of renal insufficiency. Initial encounter. EXAM: RENAL / URINARY TRACT ULTRASOUND COMPLETE COMPARISON:  CT of the abdomen and pelvis performed 09/15/2015 FINDINGS: Right Kidney: Length: 13.8 cm. Echogenicity within normal limits. No mass or hydronephrosis visualized. Left Kidney: Length: 13.4 cm. Echogenicity within normal limits. No mass or hydronephrosis visualized. Bladder: Appears normal for degree of bladder distention. IMPRESSION:  Unremarkable renal ultrasound.  No evidence of hydronephrosis. Electronically Signed   By: Roanna Raider M.D.   On: 06/10/2016 03:28    Lab Data:  CBC:  Recent Labs Lab 06/09/16 1501 06/11/16 0217  06/12/16 0229  WBC 13.8* 13.5* 14.8*  HGB 10.6* 10.4* 10.2*  HCT 34.8* 34.3* 32.9*  MCV 77.7* 77.4* 76.9*  PLT 319 332 325   Basic Metabolic Panel:  Recent Labs Lab 06/09/16 1501 06/11/16 0217 06/12/16 0229  NA 139 138 134*  K 3.0* 3.6 3.6  CL 100* 97* 96*  CO2 28 29 26   GLUCOSE 148* 205* 228*  BUN 9 12 15   CREATININE 1.37* 1.39* 1.29*  CALCIUM 8.8* 8.7* 8.4*   GFR: Estimated Creatinine Clearance: 93.9 mL/min (by C-G formula based on SCr of 1.29 mg/dL (H)). Liver Function Tests: No results for input(s): AST, ALT, ALKPHOS, BILITOT, PROT, ALBUMIN in the last 168 hours. No results for input(s): LIPASE, AMYLASE in the last 168 hours. No results for input(s): AMMONIA in the last 168 hours. Coagulation Profile: No results for input(s): INR, PROTIME in the last 168 hours. Cardiac Enzymes:  Recent Labs Lab 06/10/16 1300 06/10/16 1541 06/10/16 2137  TROPONINI 0.06* 0.03* 0.05*   BNP (last 3 results) No results for input(s): PROBNP in the last 8760 hours. HbA1C:  Recent Labs  06/10/16 1541  HGBA1C 8.1*   CBG:  Recent Labs Lab 06/11/16 0650 06/11/16 1112 06/11/16 1636 06/11/16 2123 06/12/16 0624  GLUCAP 215* 315* 249* 170* 209*   Lipid Profile: No results for input(s): CHOL, HDL, LDLCALC, TRIG, CHOLHDL, LDLDIRECT in the last 72 hours. Thyroid Function Tests: No results for input(s): TSH, T4TOTAL, FREET4, T3FREE, THYROIDAB in the last 72 hours. Anemia Panel: No results for input(s): VITAMINB12, FOLATE, FERRITIN, TIBC, IRON, RETICCTPCT in the last 72 hours. Urine analysis:    Component Value Date/Time   COLORURINE AMBER (A) 06/10/2016 0033   APPEARANCEUR CLEAR 06/10/2016 0033   LABSPEC 1.020 06/10/2016 0033   PHURINE 7.5 06/10/2016 0033   GLUCOSEU  NEGATIVE 06/10/2016 0033   HGBUR NEGATIVE 06/10/2016 0033   BILIRUBINUR NEGATIVE 06/10/2016 0033   KETONESUR NEGATIVE 06/10/2016 0033   PROTEINUR 30 (A) 06/10/2016 0033   UROBILINOGEN 1.0 11/27/2014 0755   NITRITE NEGATIVE 06/10/2016 0033   LEUKOCYTESUR NEGATIVE 06/10/2016 0033     Nelson Noone M.D. [facility] 06/12/2016, 9:25 AM  Pager: [phone] Between 7am to 7pm - call Pager - [phone]  After 7pm go to [email or web address] - password [email or web address]  Call night coverage person covering after 7pm

## 2016-06-13 LAB — CBC
HCT: 33.1 % — ABNORMAL LOW (ref 39.0–52.0)
HEMOGLOBIN: 9.9 g/dL — AB (ref 13.0–17.0)
MCH: 23.1 pg — AB (ref 26.0–34.0)
MCHC: 29.9 g/dL — AB (ref 30.0–36.0)
MCV: 77.3 fL — AB (ref 78.0–100.0)
Platelets: 334 10*3/uL (ref 150–400)
RBC: 4.28 MIL/uL (ref 4.22–5.81)
RDW: 17.8 % — ABNORMAL HIGH (ref 11.5–15.5)
WBC: 14.1 10*3/uL — ABNORMAL HIGH (ref 4.0–10.5)

## 2016-06-13 LAB — BASIC METABOLIC PANEL
ANION GAP: 10 (ref 5–15)
BUN: 17 mg/dL (ref 6–20)
CALCIUM: 8.5 mg/dL — AB (ref 8.9–10.3)
CO2: 28 mmol/L (ref 22–32)
Chloride: 95 mmol/L — ABNORMAL LOW (ref 101–111)
Creatinine, Ser: 1.33 mg/dL — ABNORMAL HIGH (ref 0.61–1.24)
GFR calc Af Amer: >60 mL/min (ref >60)
GFR calc non Af Amer: 55 mL/min — ABNORMAL LOW (ref >60)
GLUCOSE: 331 mg/dL — AB (ref 65–99)
Potassium: 3.9 mmol/L (ref 3.5–5.1)
Sodium: 133 mmol/L — ABNORMAL LOW (ref 135–145)

## 2016-06-13 LAB — GLUCOSE, CAPILLARY
GLUCOSE-CAPILLARY: 324 mg/dL — AB (ref 65–99)
GLUCOSE-CAPILLARY: 248 mg/dL — AB (ref 65–99)
GLUCOSE-CAPILLARY: 235 mg/dL — AB (ref 65–99)
Glucose-Capillary: 212 mg/dL — ABNORMAL HIGH (ref 65–99)

## 2016-06-13 LAB — BRAIN NATRIURETIC PEPTIDE: B NATRIURETIC PEPTIDE 5: 137.5 pg/mL — AB (ref 0.0–100.0)

## 2016-06-13 MED ORDER — HYDROCERIN EX CREA
TOPICAL_CREAM | Freq: Two times a day (BID) | CUTANEOUS | Status: DC
  Administered 2016-06-13 – 2016-06-16 (×4): via TOPICAL
  Administered 2016-06-17: 1 via TOPICAL
  Filled 2016-06-13: qty 113

## 2016-06-13 MED ORDER — INSULIN ASPART 100 UNIT/ML ~~LOC~~ SOLN
8.0000 [IU] | Freq: Three times a day (TID) | SUBCUTANEOUS | Status: DC
  Administered 2016-06-13 – 2016-06-15 (×6): 8 [IU] via SUBCUTANEOUS

## 2016-06-13 NOTE — Progress Notes (Signed)
Patient Name: [name] Date of Encounter: 06/13/2016     Active Problems:   Hyperlipidemia   HYPOKALEMIA   Essential hypertension, benign   Atrial fibrillation   Obstructive sleep apnea- unable to tolerate c-pap   CAD S/P percutaneous coronary angioplasty   Chronic anticoagulation- Coumadin   CKD (chronic kidney disease) stage 3, GFR 30-59 ml/min   Acute on chronic diastolic heart failure (HCC)   Diabetes mellitus type 2 in obese (HCC)   Lactic acidosis   CHF exacerbation (HCC)    SUBJECTIVE  Dyspnea improved. No chest pain.   CURRENT MEDS . aspirin EC  81 mg Oral Daily  . atorvastatin  80 mg Oral q1800  . doxycycline  100 mg Oral Q12H  . furosemide  80 mg Intravenous BID  . gabapentin  600 mg Oral BID  . insulin aspart  0-15 Units Subcutaneous TID WC  . insulin aspart  0-5 Units Subcutaneous QHS  . insulin aspart  4 Units Subcutaneous TID WC  . insulin glargine  80 Units Subcutaneous Daily  . metoprolol  50 mg Oral BID  . potassium chloride  40 mEq Oral BID  . rivaroxaban  20 mg Oral Q supper  . sodium chloride flush  3 mL Intravenous Q12H  . spironolactone  12.5 mg Oral Daily    OBJECTIVE  Vitals:   06/12/16 0528 06/12/16 1404 06/12/16 1939 06/13/16 0419  BP: (!) 126/59 (!) 117/56 (!) 114/51 (!) 125/55  Pulse: 87 82 68 65  Resp: 18 20 18 18   Temp: 97.9 F (36.6 C) 98.6 F (37 C) 98.4 F (36.9 C) 98.2 F (36.8 C)  TempSrc: Oral Oral Oral Oral  SpO2: 99% 96% 98% 98%  Weight: (!) 375 lb 9.6 oz (170.4 kg)   (!) 369 lb 9.6 oz (167.6 kg)    Intake/Output Summary (Last 24 hours) at 06/13/16 1040 Last data filed at 06/13/16 1022  Gross per 24 hour  Intake              360 ml  Output             3600 ml  Net            -3240 ml   Filed Weights   06/11/16 0514 06/12/16 0528 06/13/16 0419  Weight: (!) 371 lb (168.3 kg) (!) 375 lb 9.6 oz (170.4 kg) (!) 369 lb 9.6 oz (167.6 kg)    PHYSICAL EXAM  General: Pleasant, NAD. Neuro: Alert and  oriented X 3. Moves all extremities spontaneously. Psych: Normal affect. HEENT:  Normal  Neck: Supple without bruits or JVD. Lungs:  Resp regular and unlabored, CTA. Heart: RRR no s3, s4, or murmurs. Abdomen: Soft, non-tender, non-distended, BS + x 4.  Extremities: No clubbing, cyanosis or edema. DP/PT/Radials 2+ and equal bilaterally.  Accessory Clinical Findings  CBC  Recent Labs  06/12/16 0229 06/13/16 0324  WBC 14.8* 14.1*  HGB 10.2* 9.9*  HCT 32.9* 33.1*  MCV 76.9* 77.3*  PLT 325 334   Basic Metabolic Panel  Recent Labs  06/12/16 0229 06/13/16 0324  NA 134* 133*  K 3.6 3.9  CL 96* 95*  CO2 26 28  GLUCOSE 228* 331*  BUN 15 17  CREATININE 1.29* 1.33*  CALCIUM 8.4* 8.5*   Liver Function Tests No results for input(s): AST, ALT, ALKPHOS, BILITOT, PROT, ALBUMIN in the last 72 hours. No results for input(s): LIPASE, AMYLASE in the last 72 hours. Cardiac Enzymes  Recent Labs  06/10/16  1300 06/10/16 1541 06/10/16 2137  TROPONINI 0.06* 0.03* 0.05*   BNP Invalid input(s): POCBNP D-Dimer No results for input(s): DDIMER in the last 72 hours. Hemoglobin A1C  Recent Labs  06/10/16 1541  HGBA1C 8.1*   Fasting Lipid Panel No results for input(s): CHOL, HDL, LDLCALC, TRIG, CHOLHDL, LDLDIRECT in the last 72 hours. Thyroid Function Tests No results for input(s): TSH, T4TOTAL, T3FREE, THYROIDAB in the last 72 hours.  Invalid input(s): FREET3  TELE  Atrial fib  Radiology/Studies  Dg Chest 2 View  Result Date: 06/09/2016 CLINICAL DATA:  Shortness of breath, leg swelling for 1 week EXAM: CHEST  2 VIEW COMPARISON:  Chest x-ray of 07/08/2015 FINDINGS: The patient could not stand and the patient's chin overlies the upper lung feels obscuring detail. However on the best images possible, no pneumonia is seen. No effusion is noted. Mediastinal and hilar contours are unremarkable. Mild pulmonary vascular congestion cannot be excluded. The heart is mildly enlarged.  No bony abnormality is seen. IMPRESSION: 1. Cardiomegaly.  Cannot exclude mild pulmonary vascular congestion. 2. No pneumonia or effusion. Electronically Signed   By: Dwyane Dee M.D.   On: 06/09/2016 16:10   US Renal  Result Date: 06/10/2016 CLINICAL DATA:  Acute onset of renal insufficiency. Initial encounter. EXAM: RENAL / URINARY TRACT ULTRASOUND COMPLETE COMPARISON:  CT of the abdomen and pelvis performed 09/15/2015 FINDINGS: Right Kidney: Length: 13.8 cm. Echogenicity within normal limits. No mass or hydronephrosis visualized. Left Kidney: Length: 13.4 cm. Echogenicity within normal limits. No mass or hydronephrosis visualized. Bladder: Appears normal for degree of bladder distention. IMPRESSION: Unremarkable renal ultrasound.  No evidence of hydronephrosis. Electronically Signed   By: Roanna Raider M.D.   On: 06/10/2016 03:28    ASSESSMENT AND PLAN  1. Acute on chronic diastolic heart failure - He continues to diurese and renal function is stable. He will continue IV lasix. 2. Acute on chronic renal insufficiency - his creatinine is stable. Will follow. 3. Atrial fib - his ventricular rate is controlled. Will follow.  4. Morbid obesity - I have discussed the importance of weight loss.   Sharlot Gowda Filomeno Cromley,M.D.  06/13/2016 10:40 AMPatient ID: [name], male   DOB: Sep 03, 1950, 65 y.o.   MRN: [id number]

## 2016-06-13 NOTE — Consult Note (Signed)
WOC Nurse wound consult note Reason for Consult: Bilateral LEs with chronic changes due to edema and trauma (patient scratching).  Full and partial thickness wounds, mild erythema and severe dryness noted.  Patient performs self care using neosporin ointment and alcohol. Last seen by my partner D. Engels 6 months ago for skin changes on the buttocks. Wound type: Trauma, venous insufficiency Pressure Ulcer POA: No Measurement: Bilateral LEs with erythema circumferentially with areas of scattered full and partial thickness skin loss where he has scratched.  Fingernails noted to have dried blood beneath them.  Pateint is scratching his head during my assessment; states it is a "nervous habit". Wound bed: Red, dry with dried serum (scabbing) Drainage (amount, consistency, odor) none at this time Periwound: erythema in the distal 1/3 of the LEs Dressing procedure/placement/frequency: I will provide Nursing with guidance via the Orders for skin care that will rehydrate the LEs (eucerin) applied twice daily.  Additionally, we will cleanse the LEs twice daily with a pH balanced, no rinse moisturizing skin cleanser that will soften and remove the dry skin accumulation. Once home, the patient can continue this care plan daily. WOC nursing team will not follow, but will remain available to this patient, the nursing and medical teams.  Please re-consult if needed. Thanks, Ladona Mow, MSN, RN, GNP, [place]  Pager# [phone]

## 2016-06-13 NOTE — Progress Notes (Signed)
PROGRESS NOTE    [name]  ZJQ:[id number] DOB: Jul 01, 1951 DOA: 06/09/2016 PCP: [name], MD   Brief Narrative: 65 yo male with morbid obesity, DM2, Chronic Afib on Xarelto, CAD s/p PCI and diastolic HF admitted with several days of SOB with exertion, 20 lb weight gain due to acute exacerbation of diastolic heart failure.   Assessment & Plan:   # Acute on chronic diastolic heart failure: -Continue IV Lasix, aldactone. Patient has negative balance of 4.7 L since admission. Responding well with diuretics. -Monitoring weight and strict in's and out -Echocardiogram showed left ventricular ejection fraction of 60-65%, no regional wall motion abnormalities. -Cardiology consult following.  #Chronic atrial fibrillation: Continue metoprolol for rate control and systemic anticoagulation with xarelto.    #Essential hypertension, benign: Blood pressure acceptable. Continue to monitor  #Chronic kidney disease stage III: Serum creatinine level around baseline. Continue diuretics. Monitor electrolytes. Avoid nephrotoxins.    # Diabetes mellitus type 2 in obese [facility]): Blood sugar level elevated. Insulin dose adjusted. Monitor blood sugar level.  #Bilateral lower extremity chronic venous stasis changes and underlying cellulitis: Patient has scaly weeping bilateral lower extremity skin, mostly exacerbated by edema and has possible infection. Continue doxycycline, 1 care consult called.      Obstructive sleep apnea- unable to tolerate c-pap   CAD S/P percutaneous coronary angioplasty   Chronic anticoagulation   CKD (chronic kidney disease) stage 3, GFR 30-59 ml/min   Acute on chronic diastolic heart failure (HCC)  DVT prophylaxis: Systemic anticoagulation Code Status: Full code Family Communication: No family present at bedside Disposition Plan: Likely discharge home in 1-2 days    Consultants:   Cardiology  Procedures: Echo Antimicrobials: Doxycycline  Subjective: Patient  was seen and examined at bedside. Reported shortness of breath is improving. Denied headache, dizziness, chest pain, nausea vomiting. Lower extremity edema still there. Reports increased urination because of Lasix.   Objective: Vitals:   06/12/16 0528 06/12/16 1404 06/12/16 1939 06/13/16 0419  BP: (!) 126/59 (!) 117/56 (!) 114/51 (!) 125/55  Pulse: 87 82 68 65  Resp: 18 20 18 18   Temp: 97.9 F (36.6 C) 98.6 F (37 C) 98.4 F (36.9 C) 98.2 F (36.8 C)  TempSrc: Oral Oral Oral Oral  SpO2: 99% 96% 98% 98%  Weight: (!) 170.4 kg (375 lb 9.6 oz)   (!) 167.6 kg (369 lb 9.6 oz)    Intake/Output Summary (Last 24 hours) at 06/13/16 1116 Last data filed at 06/13/16 1022  Gross per 24 hour  Intake              360 ml  Output             3600 ml  Net            -3240 ml   Filed Weights   06/11/16 0514 06/12/16 0528 06/13/16 0419  Weight: (!) 168.3 kg (371 lb) (!) 170.4 kg (375 lb 9.6 oz) (!) 167.6 kg (369 lb 9.6 oz)    Examination:  General exam: Obese male sitting on chair looks comfortable.  Respiratory system: Clear to auscultation. Respiratory effort normal. No wheezing or crackle Cardiovascular system: S1 & S2 heard, RRR.   Gastrointestinal system: Abdomen is  soft and nontender. Normal bowel sounds heard. Central nervous system: Alert and oriented. No focal neurological deficits. Extremities: Bilateral lower extremity pitting edema with chronic venous stasis skin changes, scattered open wound with weeping serous liquid. Skin: Bilateral lower extremity open skin wound with clear liquid drainage  likely due to edema.  Psychiatry: Judgement and insight appear normal. Mood & affect appropriate.     Data Reviewed: I have personally reviewed following labs and imaging studies  CBC:  Recent Labs Lab 06/09/16 1501 06/11/16 0217 06/12/16 0229 06/13/16 0324  WBC 13.8* 13.5* 14.8* 14.1*  HGB 10.6* 10.4* 10.2* 9.9*  HCT 34.8* 34.3* 32.9* 33.1*  MCV 77.7* 77.4* 76.9* 77.3*  PLT  319 332 325 334   Basic Metabolic Panel:  Recent Labs Lab 06/09/16 1501 06/11/16 0217 06/12/16 0229 06/13/16 0324  NA 139 138 134* 133*  K 3.0* 3.6 3.6 3.9  CL 100* 97* 96* 95*  CO2 28 29 26 28   GLUCOSE 148* 205* 228* 331*  BUN 9 12 15 17   CREATININE 1.37* 1.39* 1.29* 1.33*  CALCIUM 8.8* 8.7* 8.4* 8.5*   GFR: Estimated Creatinine Clearance: 90.2 mL/min (by C-G formula based on SCr of 1.33 mg/dL (H)). Liver Function Tests: No results for input(s): AST, ALT, ALKPHOS, BILITOT, PROT, ALBUMIN in the last 168 hours. No results for input(s): LIPASE, AMYLASE in the last 168 hours. No results for input(s): AMMONIA in the last 168 hours. Coagulation Profile: No results for input(s): INR, PROTIME in the last 168 hours. Cardiac Enzymes:  Recent Labs Lab 06/10/16 1300 06/10/16 1541 06/10/16 2137  TROPONINI 0.06* 0.03* 0.05*   BNP (last 3 results) No results for input(s): PROBNP in the last 8760 hours. HbA1C:  Recent Labs  06/10/16 1541  HGBA1C 8.1*   CBG:  Recent Labs Lab 06/12/16 0624 06/12/16 1117 06/12/16 1614 06/12/16 2206 06/13/16 0614  GLUCAP 209* 278* 239* 278* 324*   Lipid Profile: No results for input(s): CHOL, HDL, LDLCALC, TRIG, CHOLHDL, LDLDIRECT in the last 72 hours. Thyroid Function Tests: No results for input(s): TSH, T4TOTAL, FREET4, T3FREE, THYROIDAB in the last 72 hours. Anemia Panel: No results for input(s): VITAMINB12, FOLATE, FERRITIN, TIBC, IRON, RETICCTPCT in the last 72 hours. Sepsis Labs:  Recent Labs Lab 06/09/16 2244 06/10/16 0144 06/10/16 0150 06/10/16 0410  LATICACIDVEN 2.56* 2.3* 2.53* 3.5*    No results found for this or any previous visit (from the past 240 hour(s)).       Radiology Studies: No results found.      Scheduled Meds: . aspirin EC  81 mg Oral Daily  . atorvastatin  80 mg Oral q1800  . doxycycline  100 mg Oral Q12H  . furosemide  80 mg Intravenous BID  . gabapentin  600 mg Oral BID  . insulin  aspart  0-15 Units Subcutaneous TID WC  . insulin aspart  0-5 Units Subcutaneous QHS  . insulin aspart  4 Units Subcutaneous TID WC  . insulin glargine  80 Units Subcutaneous Daily  . metoprolol  50 mg Oral BID  . potassium chloride  40 mEq Oral BID  . rivaroxaban  20 mg Oral Q supper  . sodium chloride flush  3 mL Intravenous Q12H  . spironolactone  12.5 mg Oral Daily   Continuous Infusions:   LOS: 3 days    Dron 06/12/16, MD [name] Pager [phone]  If 7PM-7AM, please contact night-coverage [email or web address] Password [email or web address] 06/13/2016, 11:16 AM

## 2016-06-14 ENCOUNTER — Other Ambulatory Visit: Payer: Self-pay | Admitting: Endocrinology

## 2016-06-14 DIAGNOSIS — I2510 Atherosclerotic heart disease of native coronary artery without angina pectoris: Secondary | ICD-10-CM

## 2016-06-14 DIAGNOSIS — Z7901 Long term (current) use of anticoagulants: Secondary | ICD-10-CM

## 2016-06-14 DIAGNOSIS — Z9861 Coronary angioplasty status: Secondary | ICD-10-CM

## 2016-06-14 LAB — BRAIN NATRIURETIC PEPTIDE: B Natriuretic Peptide: 101.8 pg/mL — ABNORMAL HIGH (ref 0.0–100.0)

## 2016-06-14 LAB — BASIC METABOLIC PANEL
Anion gap: 11 (ref 5–15)
BUN: 22 mg/dL — ABNORMAL HIGH (ref 6–20)
CO2: 28 mmol/L (ref 22–32)
CREATININE: 1.49 mg/dL — AB (ref 0.61–1.24)
Calcium: 8.7 mg/dL — ABNORMAL LOW (ref 8.9–10.3)
Chloride: 91 mmol/L — ABNORMAL LOW (ref 101–111)
GFR, EST AFRICAN AMERICAN: 55 mL/min — AB (ref >60)
GFR, EST NON AFRICAN AMERICAN: 48 mL/min — AB (ref >60)
Glucose, Bld: 231 mg/dL — ABNORMAL HIGH (ref 65–99)
POTASSIUM: 3.9 mmol/L (ref 3.5–5.1)
SODIUM: 130 mmol/L — AB (ref 135–145)

## 2016-06-14 LAB — CBC
HEMATOCRIT: 33.6 % — AB (ref 39.0–52.0)
Hemoglobin: 10.6 g/dL — ABNORMAL LOW (ref 13.0–17.0)
MCH: 23.9 pg — ABNORMAL LOW (ref 26.0–34.0)
MCHC: 31.5 g/dL (ref 30.0–36.0)
MCV: 75.7 fL — ABNORMAL LOW (ref 78.0–100.0)
PLATELETS: 338 10*3/uL (ref 150–400)
RBC: 4.44 MIL/uL (ref 4.22–5.81)
RDW: 17.3 % — AB (ref 11.5–15.5)
WBC: 14.8 10*3/uL — AB (ref 4.0–10.5)

## 2016-06-14 LAB — GLUCOSE, CAPILLARY
GLUCOSE-CAPILLARY: 268 mg/dL — AB (ref 65–99)
GLUCOSE-CAPILLARY: 262 mg/dL — AB (ref 65–99)
Glucose-Capillary: 413 mg/dL — ABNORMAL HIGH (ref 65–99)
Glucose-Capillary: 296 mg/dL — ABNORMAL HIGH (ref 65–99)

## 2016-06-14 LAB — TSH: TSH: 0.687 u[IU]/mL (ref 0.350–4.500)

## 2016-06-14 MED ORDER — PIPERACILLIN-TAZOBACTAM 3.375 G IVPB
3.3750 g | Freq: Three times a day (TID) | INTRAVENOUS | Status: DC
  Administered 2016-06-14 – 2016-06-17 (×8): 3.375 g via INTRAVENOUS
  Filled 2016-06-14 (×12): qty 50

## 2016-06-14 MED ORDER — TRAMADOL HCL 50 MG PO TABS: 50.0000 mg | ORAL_TABLET | Freq: Three times a day (TID) | ORAL | Status: DC | PRN

## 2016-06-14 MED ORDER — INSULIN GLARGINE 100 UNIT/ML ~~LOC~~ SOLN
95.0000 [IU] | Freq: Every day | SUBCUTANEOUS | Status: DC
Start: 2016-06-14 — End: 2016-06-14

## 2016-06-14 MED ORDER — INSULIN GLARGINE 100 UNIT/ML ~~LOC~~ SOLN
95.0000 [IU] | Freq: Every day | SUBCUTANEOUS | Status: DC
  Administered 2016-06-14: 95 [IU] via SUBCUTANEOUS
  Filled 2016-06-14 (×2): qty 0.95

## 2016-06-14 MED ORDER — INSULIN GLARGINE 100 UNIT/ML ~~LOC~~ SOLN: 15.0000 [IU] | Freq: Once | SUBCUTANEOUS | Status: DC

## 2016-06-14 NOTE — Progress Notes (Addendum)
PROGRESS NOTE    [name]  GXQ:[id number] DOB: [birth year] DOA: 06/09/2016 PCP: [name], MD   Brief Narrative: 65 yo male with morbid obesity, DM2, Chronic Afib on Xarelto, CAD s/p PCI and diastolic HF admitted with several days of SOB with exertion, 20 lb weight gain due to acute exacerbation of diastolic heart failure.   Assessment & Plan:   # Acute on chronic diastolic heart failure: -Continue IV Lasix, aldactone. Patient has negative balance of 4.7 L since admission. Responding well with diuretics. -Monitoring weight and strict in's and out . 3825 cc in 24 hours Weight 381>367   pounds since admission  -Echocardiogram showed left ventricular ejection fraction of 60-65%, no regional wall motion abnormalities.  Cardiology managing diuretics   #Chronic atrial fibrillation: Continue metoprolol for rate control and systemic anticoagulation with xarelto.    #Essential hypertension, benign: Blood pressure acceptable. Continue to monitor  #Chronic kidney disease stage III: Serum creatinine level around baseline 1.4. Continue diuretics. Monitor electrolytes. Avoid nephrotoxins.    # Diabetes mellitus type 2 in obes-uncontrolled, increase Lantus, .Hemoglobin A1c 8.1.   #Bilateral lower extremity chronic venous stasis changes and underlying cellulitis: Patient has scaly weeping bilateral lower extremity skin, mostly exacerbated by edema and has possible infection. Continue doxycycline, add zosyn due to slow improvement. Complaining of bilateral lower extremity pain, added tramadol for pain control  #  Obstructive sleep apnea- unable to tolerate c-pap   # CAD S/P percutaneous coronary angioplasty last cardiac catheterization in July 2015 showed a 40-50% mid LAD, 50% circumflex and 30% RCA. Ejection fraction 65%.  # Morbid Obesity: Body mass index is 49.83 kg/m.     DVT prophylaxis: Systemic anticoagulation Code Status: Full code Family Communication: No family  present at bedside Disposition Plan: Likely discharge home in 1-2 days    Consultants:   Cardiology  Procedures: Echo Antimicrobials: Doxycycline  Subjective: Complaining of bilateral lower extremity pain     Objective: Vitals:   06/12/16 1939 06/13/16 0419 06/13/16 1300 06/14/16 0532  BP: (!) 114/51 (!) 125/55 (!) 113/57 (!) 109/53  Pulse: 68 65 75   Resp: 18 18 18 16   Temp: 98.4 F (36.9 C) 98.2 F (36.8 C) 98 F (36.7 C) 98.6 F (37 C)  TempSrc: Oral Oral Oral Oral  SpO2: 98% 98% 94% 98%  Weight:  (!) 167.6 kg (369 lb 9.6 oz)  (!) 166.7 kg (367 lb 6.4 oz)    Intake/Output Summary (Last 24 hours) at 06/14/16 0931 Last data filed at 06/14/16 0841  Gross per 24 hour  Intake              840 ml  Output             3425 ml  Net            -2585 ml   Filed Weights   06/12/16 0528 06/13/16 0419 06/14/16 0532  Weight: (!) 170.4 kg (375 lb 9.6 oz) (!) 167.6 kg (369 lb 9.6 oz) (!) 166.7 kg (367 lb 6.4 oz)    Examination:  General exam: Obese male sitting on chair looks comfortable.  Respiratory system: Clear to auscultation. Respiratory effort normal. No wheezing or crackle Cardiovascular system: S1 & S2 heard, RRR.   Gastrointestinal system: Abdomen is  soft and nontender. Normal bowel sounds heard. Central nervous system: Alert and oriented. No focal neurological deficits. Extremities: Bilateral lower extremity pitting edema with chronic venous stasis skin changes, scattered open wound with weeping serous liquid. Skin:  Bilateral lower extremity open skin wound with clear liquid drainage likely due to edema.  Psychiatry: Judgement and insight appear normal. Mood & affect appropriate.     Data Reviewed: I have personally reviewed following labs and imaging studies  CBC:  Recent Labs Lab 06/09/16 1501 06/11/16 0217 06/12/16 0229 06/13/16 0324 06/14/16 0303  WBC 13.8* 13.5* 14.8* 14.1* 14.8*  HGB 10.6* 10.4* 10.2* 9.9* 10.6*  HCT 34.8* 34.3* 32.9* 33.1*  33.6*  MCV 77.7* 77.4* 76.9* 77.3* 75.7*  PLT 319 332 325 334 338   Basic Metabolic Panel:  Recent Labs Lab 06/09/16 1501 06/11/16 0217 06/12/16 0229 06/13/16 0324 06/14/16 0303  NA 139 138 134* 133* 130*  K 3.0* 3.6 3.6 3.9 3.9  CL 100* 97* 96* 95* 91*  CO2 28 29 26 28 28   GLUCOSE 148* 205* 228* 331* 231*  BUN 9 12 15 17  22*  CREATININE 1.37* 1.39* 1.29* 1.33* 1.49*  CALCIUM 8.8* 8.7* 8.4* 8.5* 8.7*   GFR: Estimated Creatinine Clearance: 80.2 mL/min (by C-G formula based on SCr of 1.49 mg/dL (H)). Liver Function Tests: No results for input(s): AST, ALT, ALKPHOS, BILITOT, PROT, ALBUMIN in the last 168 hours. No results for input(s): LIPASE, AMYLASE in the last 168 hours. No results for input(s): AMMONIA in the last 168 hours. Coagulation Profile: No results for input(s): INR, PROTIME in the last 168 hours. Cardiac Enzymes:  Recent Labs Lab 06/10/16 1300 06/10/16 1541 06/10/16 2137  TROPONINI 0.06* 0.03* 0.05*   BNP (last 3 results) No results for input(s): PROBNP in the last 8760 hours. HbA1C: No results for input(s): HGBA1C in the last 72 hours. CBG:  Recent Labs Lab 06/13/16 0614 06/13/16 1112 06/13/16 1641 06/13/16 2123 06/14/16 0635  GLUCAP 324* 248* 212* 235* 268*   Lipid Profile: No results for input(s): CHOL, HDL, LDLCALC, TRIG, CHOLHDL, LDLDIRECT in the last 72 hours. Thyroid Function Tests: No results for input(s): TSH, T4TOTAL, FREET4, T3FREE, THYROIDAB in the last 72 hours. Anemia Panel: No results for input(s): VITAMINB12, FOLATE, FERRITIN, TIBC, IRON, RETICCTPCT in the last 72 hours. Sepsis Labs:  Recent Labs Lab 06/09/16 2244 06/10/16 0144 06/10/16 0150 06/10/16 0410  LATICACIDVEN 2.56* 2.3* 2.53* 3.5*    No results found for this or any previous visit (from the past 240 hour(s)).       Radiology Studies: No results found.      Scheduled Meds: . aspirin EC  81 mg Oral Daily  . atorvastatin  80 mg Oral q1800  .  doxycycline  100 mg Oral Q12H  . furosemide  80 mg Intravenous BID  . gabapentin  600 mg Oral BID  . hydrocerin   Topical BID  . insulin aspart  0-15 Units Subcutaneous TID WC  . insulin aspart  0-5 Units Subcutaneous QHS  . insulin aspart  8 Units Subcutaneous TID WC  . insulin glargine  80 Units Subcutaneous Daily  . metoprolol  50 mg Oral BID  . potassium chloride  40 mEq Oral BID  . rivaroxaban  20 mg Oral Q supper  . sodium chloride flush  3 mL Intravenous Q12H  . spironolactone  12.5 mg Oral Daily   Continuous Infusions:   LOS: 4 days    06/12/16, MD [name] Pager  [phone]  If 7PM-7AM, please contact night-coverage [email or web address] Password [email or web address] 06/14/2016, 9:31 AM

## 2016-06-14 NOTE — Progress Notes (Signed)
Pharmacy Antibiotic Note  [name] is a 65 y.o. male admitted on 06/09/2016 with cellulitis.  Pharmacy has been consulted for Zosyn dosing, to add Pseudomonal coverage.  Pt has been on Doxycycline without improvement of WBC.  CrCl, normalized for > 100kg, is 40-29ml/min  Plan: Zosyn 3.375g IV q8, infuse over 4hr Monitor for clinical improvement, ability to narrow antibiotics  Weight: (!) 367 lb 6.4 oz (166.7 kg)  Temp (24hrs), Avg:98.3 F (36.8 C), Min:98 F (36.7 C), Max:98.6 F (37 C)   Recent Labs Lab 06/09/16 1501 06/09/16 2244 06/10/16 0144 06/10/16 0150 06/10/16 0410 06/11/16 0217 06/12/16 0229 06/13/16 0324 06/14/16 0303  WBC 13.8*  --   --   --   --  13.5* 14.8* 14.1* 14.8*  CREATININE 1.37*  --   --   --   --  1.39* 1.29* 1.33* 1.49*  LATICACIDVEN  --  2.56* 2.3* 2.53* 3.5*  --   --   --   --     Estimated Creatinine Clearance: 80.2 mL/min (by C-G formula based on SCr of 1.49 mg/dL (H)).    No Known Allergies  Antimicrobials this admission:  12/1 Doxy >>  12/4 Zosyn >>   Dose adjustments this admission:    Microbiology results:  (no cx data as of 12/4)   Thank you for allowing pharmacy to be a part of this patient's care.   Marisue Humble, PharmD Clinical Pharmacist [facility]

## 2016-06-14 NOTE — Progress Notes (Signed)
Patient Name: [name] Date of Encounter: 06/14/2016  Primary Cardiologist: Dr. Janae Sauce Problem List     Active Problems:   Hyperlipidemia   HYPOKALEMIA   Essential hypertension, benign   Atrial fibrillation   Obstructive sleep apnea- unable to tolerate c-pap   CAD S/P percutaneous coronary angioplasty   Chronic anticoagulation- Coumadin   CKD (chronic kidney disease) stage 3, GFR 30-59 ml/min   Acute on chronic diastolic heart failure (HCC)   Diabetes mellitus type 2 in obese (HCC)   Lactic acidosis   CHF exacerbation [facility])    Patient Profile     65 year old male with past medical history of DM, morbid obesity, coronary artery disease, permanent atrial fibrillation, chronic diastolic congestive heart failure, chronic stage III kidney disease for evaluation of acute on chronic diastolic congestive heart failure.  Subjective   Doing ok this morning. Breathing improved.   Inpatient Medications    Scheduled Meds: . aspirin EC  81 mg Oral Daily  . atorvastatin  80 mg Oral q1800  . doxycycline  100 mg Oral Q12H  . furosemide  80 mg Intravenous BID  . gabapentin  600 mg Oral BID  . hydrocerin   Topical BID  . insulin aspart  0-15 Units Subcutaneous TID WC  . insulin aspart  0-5 Units Subcutaneous QHS  . insulin aspart  8 Units Subcutaneous TID WC  . insulin glargine  95 Units Subcutaneous Daily  . metoprolol  50 mg Oral BID  . potassium chloride  40 mEq Oral BID  . rivaroxaban  20 mg Oral Q supper  . sodium chloride flush  3 mL Intravenous Q12H  . spironolactone  12.5 mg Oral Daily   Continuous Infusions:  PRN Meds: sodium chloride, acetaminophen, morphine injection, ondansetron (ZOFRAN) IV, oxyCODONE, sodium chloride flush   Vital Signs    Vitals:   06/12/16 1939 06/13/16 0419 06/13/16 1300 06/14/16 0532  BP: (!) 114/51 (!) 125/55 (!) 113/57 (!) 109/53  Pulse: 68 65 75   Resp: 18 18 18 16   Temp: 98.4 F (36.9 C) 98.2 F (36.8 C) 98 F  (36.7 C) 98.6 F (37 C)  TempSrc: Oral Oral Oral Oral  SpO2: 98% 98% 94% 98%  Weight:  (!) 369 lb 9.6 oz (167.6 kg)  (!) 367 lb 6.4 oz (166.7 kg)    Intake/Output Summary (Last 24 hours) at 06/14/16 0959 Last data filed at 06/14/16 0948  Gross per 24 hour  Intake              840 ml  Output             3425 ml  Net            -2585 ml   Filed Weights   06/12/16 0528 06/13/16 0419 06/14/16 0532  Weight: (!) 375 lb 9.6 oz (170.4 kg) (!) 369 lb 9.6 oz (167.6 kg) (!) 367 lb 6.4 oz (166.7 kg)    Physical Exam   GEN: Well nourished, well developed, in no acute distress. Morbidly obese  HEENT: Grossly normal.  Neck: Supple, no JVD, carotid bruits, or masses. Cardiac: irregularly irregular rhythm, regular rate , no murmurs, rubs, or gallops. No clubbing or cyanosis, can not assess LEE (both LEs are wrapped).  Radials/DP/PT 2+ and equal bilaterally.  Respiratory:  Respirations regular and unlabored, clear to auscultation bilaterally. GI: Soft, nontender, nondistended, BS + x 4. Obese  MS: no deformity or atrophy. Skin: warm and dry, bilateral LE cellulitis  Neuro:  Strength and sensation are intact. Psych: AAOx3.  Normal affect.  Labs    CBC  Recent Labs  06/13/16 0324 06/14/16 0303  WBC 14.1* 14.8*  HGB 9.9* 10.6*  HCT 33.1* 33.6*  MCV 77.3* 75.7*  PLT 334 338   Basic Metabolic Panel  Recent Labs  06/13/16 0324 06/14/16 0303  NA 133* 130*  K 3.9 3.9  CL 95* 91*  CO2 28 28  GLUCOSE 331* 231*  BUN 17 22*  CREATININE 1.33* 1.49*  CALCIUM 8.5* 8.7*   Liver Function Tests No results for input(s): AST, ALT, ALKPHOS, BILITOT, PROT, ALBUMIN in the last 72 hours. No results for input(s): LIPASE, AMYLASE in the last 72 hours. Cardiac Enzymes No results for input(s): CKTOTAL, CKMB, CKMBINDEX, TROPONINI in the last 72 hours. BNP Invalid input(s): POCBNP D-Dimer No results for input(s): DDIMER in the last 72 hours. Hemoglobin A1C No results for input(s): HGBA1C in  the last 72 hours. Fasting Lipid Panel No results for input(s): CHOL, HDL, LDLCALC, TRIG, CHOLHDL, LDLDIRECT in the last 72 hours. Thyroid Function Tests No results for input(s): TSH, T4TOTAL, T3FREE, THYROIDAB in the last 72 hours.  Invalid input(s): FREET3  Telemetry    Atrial fibrillation with a CVR - Personally Reviewed  Radiology    No results found.  Cardiac Studies   2D Echo 06/11/16 Study Conclusions  - Left ventricle: The cavity size was normal. There was moderate   concentric hypertrophy. Systolic function was normal. The   estimated ejection fraction was in the range of 60% to 65%. Wall   motion was normal; there were no regional wall motion   abnormalities. - Aortic valve: Trileaflet; moderately thickened, moderately   calcified leaflets. - Mitral valve: Calcified annulus. There was trivial regurgitation. - Left atrium: The atrium was moderately dilated. Volume/bsa, ES   (1-plane Simpson&'s, A4C): 47.5 ml/m^2.  Impressions:  - Compared to the prior study, there has been no significant   interval change.   Patient Profile     65 year old male with past medical history of DM, morbid obesity, coronary artery disease, permanent atrial fibrillation, chronic diastolic congestive heart failure, chronic stage III kidney disease for evaluation of acute on chronic diastolic congestive heart failure.   Assessment & Plan    1. Acute on Chronic Diastolic CHF: Pt noted 20 + lb weight gain at time of admit. Much of volume excess is also likely related to right heart failure from morbid obesity and obstructive sleep apnea. Good diuresis thus far. -3L out in past 24 hrs. I/Os net negative 8L since admit. Admit weight was 381 lb. Weight today is 367 lb. Pt notes baseline weight is in the 350 lb.  K is stable at 3.9. He has renal insufficiency. Scr has been trending upward over the last 3 days from 1.29>>1.33>1.49.  He needs additional diuresis. Continue IV lasix. Monitor  renal function and BP. 2D echo 06/11/16 showed normal LVEF at 60-65% and normal wall motion. Continue daily weights, strict I/Os and low sodium diet.   2. Chronic Atrial Fibrillation: rate is controlled with metoprolol. He is on Xarelto for a/c.   3. CKD, Stage 3: Scr has been trending upward over the last 3 days from 1.29>>1.33>1.49.  Will need to monitor closely while we are diuresing with IV lasix.   4. CAD: Patient had PCI of his right coronary artery in August 2012 in [place]. Patient's last cardiac catheterization in July 2015 showed a 40-50% mid LAD, 50% circumflex and 30% RCA.  Ejection fraction 65%. Patient did develop contrast nephropathy. He denies CP.   5. Morbid Obesity: Body mass index is 49.83 kg/m.  6. Lower Extremity Cellulitis: chronic venous stasis changes and underlying cellulitis. IM managing with antibiotics. He is on Doxycycline. Wound care team also following.   7. OSA: unable to tolerate CPAP.   8. HTN: BP is soft but stable. He is on metoprolol for rate control of his afib. Also on IV lasix and spironolactone. Monitor BP.   Signed, Robbie Lis, PA-C  06/14/2016, 9:59 AM

## 2016-06-14 NOTE — Progress Notes (Signed)
Inpatient Diabetes Program Recommendations  AACE/ADA: New Consensus Statement on Inpatient Glycemic Control (2015)  Target Ranges:  Prepandial:   less than 140 mg/dL      Peak postprandial:   less than 180 mg/dL (1-2 hours)      Critically ill patients:  140 - 180 mg/dL   Lab Results  Component Value Date   GLUCAP 413 (H) 06/14/2016   HGBA1C 8.1 (H) 06/10/2016    Review of Glycemic Control:  Results for [name], [name] (MRN [id number]) as of 06/14/2016 13:13  Ref. Range 06/13/2016 06:14 06/13/2016 11:12 06/13/2016 16:41 06/13/2016 21:23 06/14/2016 06:35 06/14/2016 11:26  Glucose-Capillary Latest Ref Range: 65 - 99 mg/dL 017 (H) 793 (H) 903 (H) 235 (H) 268 (H) 413 (H)   Diabetes history: Type 2 diabetes Outpatient Diabetes medications: Toujeo 80 units daily, Humalog 80-100 units tid with meals Current orders for Inpatient glycemic control:  Novolog moderate tid with meals and HS, Lantus 95 units daily, Novolog 8 units tid with meals  Inpatient Diabetes Program Recommendations:   Consider increasing Novolog meal coverage to 16 units tid with meals (note that patients home dose of Humalog is 80-100 units tid with meals).  Thanks, Beryl Meager, RN, BC-ADM Inpatient Diabetes Coordinator Pager [phone] (8a-5p)

## 2016-06-14 NOTE — Care Management Note (Signed)
Case Management Note Donn Pierini RN, BSN Unit 2W-Case Manager [phone]  Patient Details  Name: [name] MRN: [id number] Date of Birth: Sep 14, 1950  Subjective/Objective:   Pt admitted with Acute on Chronic HF                 Action/Plan: PTA pt lived at home with sister - per PT eval no recommendation for continued F/u- CM to follow for any further d/c needs.   Expected Discharge Date:                 Expected Discharge Plan:  Home/Self Care  In-House Referral:     Discharge planning Services  CM Consult  Post Acute Care Choice:    Choice offered to:     DME Arranged:    DME Agency:     HH Arranged:    HH Agency:     Status of Service:  In process, will continue to follow  If discussed at Long Length of Stay Meetings, dates discussed:    Additional Comments:  Darrold Span, RN 06/14/2016, 10:56 AM

## 2016-06-14 NOTE — Care Management Important Message (Signed)
Important Message  Patient Details  Name: [name] MRN: [id number] Date of Birth: Apr 17, 1951   Medicare Important Message Given:  Yes    Mikayla Chiusano Abena 06/14/2016, 11:15 AM

## 2016-06-15 DIAGNOSIS — I481 Persistent atrial fibrillation: Secondary | ICD-10-CM

## 2016-06-15 DIAGNOSIS — I5023 Acute on chronic systolic (congestive) heart failure: Secondary | ICD-10-CM

## 2016-06-15 LAB — CBC
HEMATOCRIT: 33.6 % — AB (ref 39.0–52.0)
Hemoglobin: 10.3 g/dL — ABNORMAL LOW (ref 13.0–17.0)
MCH: 23 pg — AB (ref 26.0–34.0)
MCHC: 30.7 g/dL (ref 30.0–36.0)
MCV: 75 fL — AB (ref 78.0–100.0)
Platelets: 364 10*3/uL (ref 150–400)
RBC: 4.48 MIL/uL (ref 4.22–5.81)
RDW: 17.1 % — AB (ref 11.5–15.5)
WBC: 12.5 10*3/uL — AB (ref 4.0–10.5)

## 2016-06-15 LAB — GLUCOSE, CAPILLARY
Glucose-Capillary: 438 mg/dL — ABNORMAL HIGH (ref 65–99)
Glucose-Capillary: 414 mg/dL — ABNORMAL HIGH (ref 65–99)
Glucose-Capillary: 298 mg/dL — ABNORMAL HIGH (ref 65–99)
Glucose-Capillary: 176 mg/dL — ABNORMAL HIGH (ref 65–99)

## 2016-06-15 LAB — COMPREHENSIVE METABOLIC PANEL
ALT: 16 U/L — AB (ref 17–63)
AST: 21 U/L (ref 15–41)
Albumin: 3 g/dL — ABNORMAL LOW (ref 3.5–5.0)
Alkaline Phosphatase: 77 U/L (ref 38–126)
Anion gap: 5 (ref 5–15)
BILIRUBIN TOTAL: 0.9 mg/dL (ref 0.3–1.2)
BUN: 22 mg/dL — AB (ref 6–20)
CO2: 34 mmol/L — ABNORMAL HIGH (ref 22–32)
CREATININE: 1.55 mg/dL — AB (ref 0.61–1.24)
Calcium: 8.9 mg/dL (ref 8.9–10.3)
Chloride: 90 mmol/L — ABNORMAL LOW (ref 101–111)
GFR calc Af Amer: 53 mL/min — ABNORMAL LOW (ref >60)
GFR calc non Af Amer: 46 mL/min — ABNORMAL LOW (ref >60)
Glucose, Bld: 330 mg/dL — ABNORMAL HIGH (ref 65–99)
POTASSIUM: 4.3 mmol/L (ref 3.5–5.1)
Sodium: 129 mmol/L — ABNORMAL LOW (ref 135–145)
TOTAL PROTEIN: 7.3 g/dL (ref 6.5–8.1)

## 2016-06-15 MED ORDER — TORSEMIDE 20 MG PO TABS
60.0000 mg | ORAL_TABLET | Freq: Two times a day (BID) | ORAL | Status: DC
  Administered 2016-06-15 – 2016-06-17 (×5): 60 mg via ORAL
  Filled 2016-06-15 (×5): qty 3

## 2016-06-15 MED ORDER — SODIUM CHLORIDE 0.9% FLUSH: 10.0000 mL | INTRAVENOUS | Status: DC | PRN

## 2016-06-15 MED ORDER — OXYCODONE HCL 5 MG PO TABS
20.0000 mg | ORAL_TABLET | ORAL | Status: DC | PRN
  Administered 2016-06-15 – 2016-06-17 (×11): 30 mg via ORAL
  Filled 2016-06-15 (×13): qty 6

## 2016-06-15 MED ORDER — INSULIN GLARGINE 100 UNIT/ML ~~LOC~~ SOLN
55.0000 [IU] | Freq: Two times a day (BID) | SUBCUTANEOUS | Status: DC
  Administered 2016-06-15 – 2016-06-16 (×3): 55 [IU] via SUBCUTANEOUS
  Filled 2016-06-15 (×4): qty 0.55

## 2016-06-15 MED ORDER — SODIUM CHLORIDE 0.9% FLUSH: 10.0000 mL | Freq: Two times a day (BID) | INTRAVENOUS | Status: DC

## 2016-06-15 MED ORDER — INSULIN ASPART 100 UNIT/ML ~~LOC~~ SOLN
15.0000 [IU] | Freq: Three times a day (TID) | SUBCUTANEOUS | Status: DC
  Administered 2016-06-15 – 2016-06-17 (×8): 15 [IU] via SUBCUTANEOUS

## 2016-06-15 MED ORDER — OXYCODONE HCL ER 10 MG PO T12A
10.0000 mg | EXTENDED_RELEASE_TABLET | Freq: Two times a day (BID) | ORAL | Status: DC
Start: 2016-06-15 — End: 2016-06-18
  Administered 2016-06-15 – 2016-06-17 (×6): 10 mg via ORAL
  Filled 2016-06-15 (×6): qty 1

## 2016-06-15 NOTE — Progress Notes (Signed)
Patient Name: [name] Date of Encounter: 06/15/2016  Primary Cardiologist: Dr. Janae Sauce Problem List     Active Problems:   Hyperlipidemia   HYPOKALEMIA   Essential hypertension, benign   Atrial fibrillation   Obstructive sleep apnea- unable to tolerate c-pap   CAD S/P percutaneous coronary angioplasty   Chronic anticoagulation- Coumadin   CKD (chronic kidney disease) stage 3, GFR 30-59 ml/min   Acute on chronic diastolic heart failure (HCC)   Diabetes mellitus type 2 in obese (HCC)   Lactic acidosis   CHF exacerbation (HCC)    Subjective   Breathing and edema are better.  He continues to sleep in a recliner, though this is chronic.  He remains short of breath when walking to the bathroom.   Inpatient Medications    Scheduled Meds: . aspirin EC  81 mg Oral Daily  . atorvastatin  80 mg Oral q1800  . doxycycline  100 mg Oral Q12H  . furosemide  80 mg Intravenous BID  . gabapentin  600 mg Oral BID  . hydrocerin   Topical BID  . insulin aspart  0-15 Units Subcutaneous TID WC  . insulin aspart  0-5 Units Subcutaneous QHS  . insulin aspart  15 Units Subcutaneous TID WC  . insulin glargine  55 Units Subcutaneous BID  . metoprolol  50 mg Oral BID  . piperacillin-tazobactam (ZOSYN)  IV  3.375 g Intravenous Q8H  . potassium chloride  40 mEq Oral BID  . rivaroxaban  20 mg Oral Q supper  . sodium chloride flush  3 mL Intravenous Q12H  . spironolactone  12.5 mg Oral Daily   Continuous Infusions:  PRN Meds: sodium chloride, acetaminophen, morphine injection, ondansetron (ZOFRAN) IV, oxyCODONE, sodium chloride flush, traMADol   Vital Signs    Vitals:   06/14/16 0532 06/14/16 1257 06/14/16 2057 06/15/16 0524  BP: (!) 109/53 (!) 110/52 (!) 114/45 (!) 126/57  Pulse:  74 87 72  Resp: 16 18 20 20   Temp: 98.6 F (37 C) 97.8 F (36.6 C) 97.5 F (36.4 C) 97.9 F (36.6 C)  TempSrc: Oral Oral Oral Oral  SpO2: 98% 93% 96% 95%  Weight: (!) 166.7 kg (367 lb  6.4 oz)   (!) 165.8 kg (365 lb 8 oz)    Intake/Output Summary (Last 24 hours) at 06/15/16 1053 Last data filed at 06/15/16 0300  Gross per 24 hour  Intake              340 ml  Output             3130 ml  Net            -2790 ml   Filed Weights   06/13/16 0419 06/14/16 0532 06/15/16 0524  Weight: (!) 167.6 kg (369 lb 9.6 oz) (!) 166.7 kg (367 lb 6.4 oz) (!) 165.8 kg (365 lb 8 oz)    Physical Exam   GEN: Well nourished, well developed, in no acute distress. Morbidly obese HEENT: Grossly normal.  Neck: Supple, JVD difficult to assess secondary to body habitus. No carotid bruits, or masses. Cardiac: Irregularly irregular. No murmurs, rubs, or gallops. No clubbing, cyanosis, 2+ pitting edema in the feet.  Legs wrapped with compressive dressing.  Radials/DP/PT 2+ and equal bilaterally.  Respiratory:  Respirations regular and unlabored, clear to auscultation bilaterally. GI: Soft, nontender, nondistended, BS + x 4. MS: no deformity or atrophy. Skin: warm and dry, no rash. Neuro:  Strength and sensation are intact. Psych:  AAOx3.  Normal affect.  Labs    CBC  Recent Labs  06/14/16 0303 06/15/16 0319  WBC 14.8* 12.5*  HGB 10.6* 10.3*  HCT 33.6* 33.6*  MCV 75.7* 75.0*  PLT 338 364   Basic Metabolic Panel  Recent Labs  06/14/16 0303 06/15/16 0319  NA 130* 129*  K 3.9 4.3  CL 91* 90*  CO2 28 34*  GLUCOSE 231* 330*  BUN 22* 22*  CREATININE 1.49* 1.55*  CALCIUM 8.7* 8.9   Liver Function Tests  Recent Labs  06/15/16 0319  AST 21  ALT 16*  ALKPHOS 77  BILITOT 0.9  PROT 7.3  ALBUMIN 3.0*   No results for input(s): LIPASE, AMYLASE in the last 72 hours. Cardiac Enzymes No results for input(s): CKTOTAL, CKMB, CKMBINDEX, TROPONINI in the last 72 hours. BNP Invalid input(s): POCBNP D-Dimer No results for input(s): DDIMER in the last 72 hours. Hemoglobin A1C No results for input(s): HGBA1C in the last 72 hours. Fasting Lipid Panel No results for input(s): CHOL,  HDL, LDLCALC, TRIG, CHOLHDL, LDLDIRECT in the last 72 hours. Thyroid Function Tests  Recent Labs  06/14/16 1113  TSH 0.687    Telemetry    Atrial fibrillation.  Rates <100 bpm.  - Personally Reviewed  ECG    n/a  Radiology    No results found.  Cardiac Studies   Echo 06/11/16: Study Conclusions  - Left ventricle: The cavity size was normal. There was moderate   concentric hypertrophy. Systolic function was normal. The   estimated ejection fraction was in the range of 60% to 65%. Wall   motion was normal; there were no regional wall motion   abnormalities. - Aortic valve: Trileaflet; moderately thickened, moderately   calcified leaflets. - Mitral valve: Calcified annulus. There was trivial regurgitation. - Left atrium: The atrium was moderately dilated. Volume/bsa, ES   (1-plane Simpson&'s, A4C): 47.5 ml/m^2.  Patient Profile     [phone] with CAD s/p PCI, permanent atrial fibrillation, CKD III, hypertension, hyperlipidemia, diabetes and morbid obesity here with acute on chronic diastolic heart failure.    Assessment & Plan    # Acute on chronic diastolic heart failure:  # CKD III: Mr. Difrancesco remains volume overloaded.  He is just recently well with IV Lasix. However, his creatinine increasing. We will stop IV Lasix and torsemide 60mg  bid tomorrow.  Continue spironolactone.   # Longstanding persistent atrial fibrillation: Rates well-controlled on metoprolol.  Continue to Xarelto.  Watch dose closely as GFR fluctuates.   # CAD s/p PCI:  # Hyperlipidemia: Not an active issue.  Continue aspirin, atorvastatin and metoprolol.  # Hypertension: Controlled on metoprolol.  # Morbid obesity: encourage ambulation.  Signed, , MD  06/15/2016, 10:53 AM

## 2016-06-15 NOTE — Progress Notes (Signed)
Patient sitting up in chair, power wand added by IV team. No other needs at this time, call light within reach.

## 2016-06-15 NOTE — Plan of Care (Signed)
Pt's BS 438 @ 6:30 am, exceeds the sliding scale. Hospitalist on call paged twice with no response, insulin given per max sliding scale. Upcoming RN aware.

## 2016-06-15 NOTE — Progress Notes (Signed)
PROGRESS NOTE    [name]  XLK:[id number] DOB: 12/28/1950 DOA: 06/09/2016 PCP: [name], MD   Brief Narrative: 65 yo male with morbid obesity, DM2, Chronic Afib on Xarelto, CAD s/p PCI and diastolic HF admitted with several days of SOB with exertion, 20 lb weight gain due to acute exacerbation of diastolic heart failure.   Assessment & Plan:   # Acute on chronic diastolic heart failure: -Continue IV Lasix, aldactone. Patient has negative balance of 4.7 L since admission. Responding well with diuretics. -Monitoring weight and strict in's and out . 4480 cc in 24 hours, renal function increasing Weight 381>367>365   pounds since admission  -Echocardiogram showed left ventricular ejection fraction of 60-65%, no regional wall motion abnormalities.  Cardiology managing diuretics   #Chronic atrial fibrillation: Continue metoprolol for rate control and systemic anticoagulation with xarelto.    #Essential hypertension, benign: Blood pressure acceptable. Continue to monitor  #Chronic kidney disease stage III: Serum creatinine level around baseline 1.4. Continue diuretics. Monitor electrolytes. Avoid nephrotoxins. Creatinine increasing    # Diabetes mellitus type 2 in obes-uncontrolled, changed Lantus 55 units twice a day, increased premeal NovoLog .Hemoglobin A1c 8.1.   #Bilateral lower extremity chronic venous stasis changes and underlying cellulitis: Patient has scaly weeping bilateral lower extremity skin, mostly exacerbated by edema and has possible infection. Continue doxycycline, added  zosyn due to slow improvement. White blood cell count slightly better. Complaining of bilateral lower extremity pain, added tramadol for pain control. Increase frequency of oxycodone and added OxyContin for optimal pain control, monitor for oversedation   #  Obstructive sleep apnea- unable to tolerate c-pap   # CAD S/P percutaneous coronary angioplasty last cardiac catheterization in July  2015 showed a 40-50% mid LAD, 50% circumflex and 30% RCA. Ejection fraction 65%.  # Morbid Obesity: Body mass index is 49.83 kg/m.  #hyponatremia: due to lasix ,chf , place on fluid restriction   DVT prophylaxis: Systemic anticoagulation Code Status: Full code Family Communication: No family present at bedside Disposition Plan: Likely discharge home in 1-2 days    Consultants:   Cardiology  Procedures: Echo Antimicrobials: Doxycycline  Subjective: Still complaining of bilateral lower extremity pain, chronic low back pain     Objective: Vitals:   06/14/16 0532 06/14/16 1257 06/14/16 2057 06/15/16 0524  BP: (!) 109/53 (!) 110/52 (!) 114/45 (!) 126/57  Pulse:  74 87 72  Resp: 16 18 20 20   Temp: 98.6 F (37 C) 97.8 F (36.6 C) 97.5 F (36.4 C) 97.9 F (36.6 C)  TempSrc: Oral Oral Oral Oral  SpO2: 98% 93% 96% 95%  Weight: (!) 166.7 kg (367 lb 6.4 oz)   (!) 165.8 kg (365 lb 8 oz)    Intake/Output Summary (Last 24 hours) at 06/15/16 14/05/17 Last data filed at 06/15/16 0300  Gross per 24 hour  Intake              580 ml  Output             4480 ml  Net            -3900 ml   Filed Weights   06/13/16 0419 06/14/16 0532 06/15/16 0524  Weight: (!) 167.6 kg (369 lb 9.6 oz) (!) 166.7 kg (367 lb 6.4 oz) (!) 165.8 kg (365 lb 8 oz)    Examination:  General exam: Obese male sitting on chair looks comfortable.  Respiratory system: Clear to auscultation. Respiratory effort normal. No wheezing or crackle Cardiovascular system:  S1 & S2 heard, RRR.   Gastrointestinal system: Abdomen is  soft and nontender. Normal bowel sounds heard. Central nervous system: Alert and oriented. No focal neurological deficits. Extremities: Bilateral lower extremity pitting edema with chronic venous stasis skin changes, scattered open wound with weeping serous liquid. Skin: Bilateral lower extremity open skin wound with clear liquid drainage likely due to edema.  Psychiatry: Judgement and insight  appear normal. Mood & affect appropriate.     Data Reviewed: I have personally reviewed following labs and imaging studies  CBC:  Recent Labs Lab 06/11/16 0217 06/12/16 0229 06/13/16 0324 06/14/16 0303 06/15/16 0319  WBC 13.5* 14.8* 14.1* 14.8* 12.5*  HGB 10.4* 10.2* 9.9* 10.6* 10.3*  HCT 34.3* 32.9* 33.1* 33.6* 33.6*  MCV 77.4* 76.9* 77.3* 75.7* 75.0*  PLT 332 325 334 338 364   Basic Metabolic Panel:  Recent Labs Lab 06/11/16 0217 06/12/16 0229 06/13/16 0324 06/14/16 0303 06/15/16 0319  NA 138 134* 133* 130* 129*  K 3.6 3.6 3.9 3.9 4.3  CL 97* 96* 95* 91* 90*  CO2 29 26 28 28  34*  GLUCOSE 205* 228* 331* 231* 330*  BUN 12 15 17  22* 22*  CREATININE 1.39* 1.29* 1.33* 1.49* 1.55*  CALCIUM 8.7* 8.4* 8.5* 8.7* 8.9   GFR: Estimated Creatinine Clearance: 76.9 mL/min (by C-G formula based on SCr of 1.55 mg/dL (H)). Liver Function Tests:  Recent Labs Lab 06/15/16 0319  AST 21  ALT 16*  ALKPHOS 77  BILITOT 0.9  PROT 7.3  ALBUMIN 3.0*   No results for input(s): LIPASE, AMYLASE in the last 168 hours. No results for input(s): AMMONIA in the last 168 hours. Coagulation Profile: No results for input(s): INR, PROTIME in the last 168 hours. Cardiac Enzymes:  Recent Labs Lab 06/10/16 1300 06/10/16 1541 06/10/16 2137  TROPONINI 0.06* 0.03* 0.05*   BNP (last 3 results) No results for input(s): PROBNP in the last 8760 hours. HbA1C: No results for input(s): HGBA1C in the last 72 hours. CBG:  Recent Labs Lab 06/14/16 0635 06/14/16 1126 06/14/16 1656 06/14/16 2128 06/15/16 0630  GLUCAP 268* 413* 262* 296* 438*   Lipid Profile: No results for input(s): CHOL, HDL, LDLCALC, TRIG, CHOLHDL, LDLDIRECT in the last 72 hours. Thyroid Function Tests:  Recent Labs  06/14/16 1113  TSH 0.687   Anemia Panel: No results for input(s): VITAMINB12, FOLATE, FERRITIN, TIBC, IRON, RETICCTPCT in the last 72 hours. Sepsis Labs:  Recent Labs Lab 06/09/16 2244  06/10/16 0144 06/10/16 0150 06/10/16 0410  LATICACIDVEN 2.56* 2.3* 2.53* 3.5*    No results found for this or any previous visit (from the past 240 hour(s)).       Radiology Studies: No results found.      Scheduled Meds: . aspirin EC  81 mg Oral Daily  . atorvastatin  80 mg Oral q1800  . doxycycline  100 mg Oral Q12H  . furosemide  80 mg Intravenous BID  . gabapentin  600 mg Oral BID  . hydrocerin   Topical BID  . insulin aspart  0-15 Units Subcutaneous TID WC  . insulin aspart  0-5 Units Subcutaneous QHS  . insulin aspart  15 Units Subcutaneous TID WC  . insulin glargine  55 Units Subcutaneous BID  . metoprolol  50 mg Oral BID  . piperacillin-tazobactam (ZOSYN)  IV  3.375 g Intravenous Q8H  . potassium chloride  40 mEq Oral BID  . rivaroxaban  20 mg Oral Q supper  . sodium chloride flush  3 mL Intravenous  Q12H  . spironolactone  12.5 mg Oral Daily   Continuous Infusions:   LOS: 5 days    Richarda Overlie, MD [name] Pager  [phone]  If 7PM-7AM, please contact night-coverage [email or web address] Password [email or web address] 06/15/2016, 7:52 AM

## 2016-06-15 NOTE — Progress Notes (Signed)
Inpatient Diabetes Program Recommendations  AACE/ADA: New Consensus Statement on Inpatient Glycemic Control (2015)  Target Ranges:  Prepandial:   less than 140 mg/dL      Peak postprandial:   less than 180 mg/dL (1-2 hours)      Critically ill patients:  140 - 180 mg/dL   Results for JACOBEY, GURA (MRN [id number]) as of 06/15/2016 14:35  Ref. Range 06/14/2016 06:35 06/14/2016 11:26 06/14/2016 16:56 06/14/2016 21:28 06/15/2016 06:30 06/15/2016 11:05  Glucose-Capillary Latest Ref Range: 65 - 99 mg/dL 828 (H) 003 (H) 491 (H) 296 (H) 438 (H) 414 (H)   Review of Glycemic Control  Diabetes history: DM2 Outpatient Diabetes medications: Toujeo 80 units daily, Humalog 80-100 units TID with meals Current orders for Inpatient glycemic control: Lantus 55 units BID, Novolog 15 units TID with meals, Novolog 0-15 units TID with meals, Novolog 0-5 units QHS  Inpatient Diabetes Program Recommendations: Insulin--Correction: Please consider increasing Novolog correction to resistant scale. Insulin - Basal: Noted Lantus was changed from 95 units daily to 55 units BID. Insulin - Meal Coverage: Noted meal coverage increased today to Novolog 15 units with meals. Outpatient Referral: In reviewing chart, noted patient is followed by Dr. Lucianne Muss and last seen Dr. Lucianne Muss on 04/20/16. Attending MD may want to contact Dr. Lucianne Muss for recommendations for improving inpatient DM control if not improved with changes made today.  Note: In reviewing the chart, noted patient was using Levemir and Humulin R U500 in June 2017 and was changed to Toujeo and Humalog. Patient is being followed by Dr. Lucianne Muss and last seen Dr. Lucianne Muss on 04/20/16.   Thanks, Orlando Penner, RN, MSN, CDE Diabetes Coordinator Inpatient Diabetes Program [phone] (Team Pager from 8am to 5pm)

## 2016-06-16 DIAGNOSIS — I480 Paroxysmal atrial fibrillation: Secondary | ICD-10-CM

## 2016-06-16 DIAGNOSIS — I10 Essential (primary) hypertension: Secondary | ICD-10-CM

## 2016-06-16 DIAGNOSIS — E7800 Pure hypercholesterolemia, unspecified: Secondary | ICD-10-CM

## 2016-06-16 LAB — GLUCOSE, CAPILLARY
GLUCOSE-CAPILLARY: 389 mg/dL — AB (ref 65–99)
GLUCOSE-CAPILLARY: 359 mg/dL — AB (ref 65–99)
Glucose-Capillary: 284 mg/dL — ABNORMAL HIGH (ref 65–99)

## 2016-06-16 LAB — BASIC METABOLIC PANEL
Anion gap: 12 (ref 5–15)
BUN: 22 mg/dL — AB (ref 6–20)
CHLORIDE: 92 mmol/L — AB (ref 101–111)
CO2: 30 mmol/L (ref 22–32)
CREATININE: 1.6 mg/dL — AB (ref 0.61–1.24)
Calcium: 9.2 mg/dL (ref 8.9–10.3)
GFR calc Af Amer: 51 mL/min — ABNORMAL LOW (ref >60)
GFR calc non Af Amer: 44 mL/min — ABNORMAL LOW (ref >60)
GLUCOSE: 254 mg/dL — AB (ref 65–99)
POTASSIUM: 4.1 mmol/L (ref 3.5–5.1)
Sodium: 134 mmol/L — ABNORMAL LOW (ref 135–145)

## 2016-06-16 MED ORDER — INSULIN GLARGINE 100 UNIT/ML ~~LOC~~ SOLN
65.0000 [IU] | Freq: Two times a day (BID) | SUBCUTANEOUS | Status: DC
Start: 2016-06-16 — End: 2016-06-18
  Administered 2016-06-16 – 2016-06-17 (×3): 65 [IU] via SUBCUTANEOUS
  Filled 2016-06-16 (×3): qty 0.65

## 2016-06-16 MED ORDER — INSULIN GLARGINE 100 UNIT/ML ~~LOC~~ SOLN
20.0000 [IU] | Freq: Once | SUBCUTANEOUS | Status: AC
  Administered 2016-06-16: 20 [IU] via SUBCUTANEOUS
  Filled 2016-06-16: qty 0.2

## 2016-06-16 NOTE — Progress Notes (Signed)
Patient sitting in chair, no needs at this time. Call light within reach.

## 2016-06-16 NOTE — Progress Notes (Signed)
Patient Name: [name] Date of Encounter: 06/16/2016  Primary Cardiologist: Dr. Janae Sauce Problem List     Active Problems:   Hyperlipidemia   HYPOKALEMIA   Essential hypertension, benign   Atrial fibrillation   Obstructive sleep apnea- unable to tolerate c-pap   CAD S/P percutaneous coronary angioplasty   Chronic anticoagulation- Coumadin   CKD (chronic kidney disease) stage 3, GFR 30-59 ml/min   Acute on chronic diastolic heart failure (HCC)   Diabetes mellitus type 2 in obese (HCC)   Lactic acidosis   CHF exacerbation (HCC)    Subjective   Breathing and edema are better.  He is less short of breath when walking.   Inpatient Medications    Scheduled Meds: . aspirin EC  81 mg Oral Daily  . atorvastatin  80 mg Oral q1800  . doxycycline  100 mg Oral Q12H  . gabapentin  600 mg Oral BID  . hydrocerin   Topical BID  . insulin aspart  0-15 Units Subcutaneous TID WC  . insulin aspart  0-5 Units Subcutaneous QHS  . insulin aspart  15 Units Subcutaneous TID WC  . insulin glargine  55 Units Subcutaneous BID  . metoprolol  50 mg Oral BID  . oxyCODONE  10 mg Oral Q12H  . piperacillin-tazobactam (ZOSYN)  IV  3.375 g Intravenous Q8H  . potassium chloride  40 mEq Oral BID  . rivaroxaban  20 mg Oral Q supper  . sodium chloride flush  10-40 mL Intracatheter Q12H  . sodium chloride flush  3 mL Intravenous Q12H  . spironolactone  12.5 mg Oral Daily  . torsemide  60 mg Oral BID   Continuous Infusions:  PRN Meds: sodium chloride, acetaminophen, morphine injection, ondansetron (ZOFRAN) IV, oxyCODONE, sodium chloride flush, sodium chloride flush, traMADol   Vital Signs    Vitals:   06/14/16 2057 06/15/16 0524 06/15/16 1324 06/15/16 2129  BP: (!) 114/45 (!) 126/57 111/67 (!) 132/47  Pulse: 87 72 73 73  Resp: 20 20 18 20   Temp: 97.5 F (36.4 C) 97.9 F (36.6 C) 98.4 F (36.9 C) 97.8 F (36.6 C)  TempSrc: Oral Oral Oral Oral  SpO2: 96% 95% 100% 95%    Weight:  (!) 165.8 kg (365 lb 8 oz)      Intake/Output Summary (Last 24 hours) at 06/16/16 1333 Last data filed at 06/16/16 1324  Gross per 24 hour  Intake              720 ml  Output             2502 ml  Net            -1782 ml   Filed Weights   06/13/16 0419 06/14/16 0532 06/15/16 0524  Weight: (!) 167.6 kg (369 lb 9.6 oz) (!) 166.7 kg (367 lb 6.4 oz) (!) 165.8 kg (365 lb 8 oz)    Physical Exam   GEN: Well nourished, well developed, in no acute distress. Morbidly obese HEENT: Grossly normal.  Neck: Supple, JVD difficult to assess secondary to body habitus. No carotid bruits, or masses. Cardiac: Irregularly irregular. No murmurs, rubs, or gallops. No clubbing, cyanosis, 1+ pitting edema in the feet.  Legs wrapped with compressive dressing.  Radials/DP/PT 2+ and equal bilaterally.  Respiratory:  Respirations regular and unlabored, clear to auscultation bilaterally. GI: Soft, nontender, nondistended, BS + x 4. MS: no deformity or atrophy. Skin: warm and dry, no rash. Neuro:  Strength and sensation are intact.  Psych: AAOx3.  Normal affect.  Labs    CBC  Recent Labs  06/14/16 0303 06/15/16 0319  WBC 14.8* 12.5*  HGB 10.6* 10.3*  HCT 33.6* 33.6*  MCV 75.7* 75.0*  PLT 338 364   Basic Metabolic Panel  Recent Labs  06/15/16 0319 06/16/16 0526  NA 129* 134*  K 4.3 4.1  CL 90* 92*  CO2 34* 30  GLUCOSE 330* 254*  BUN 22* 22*  CREATININE 1.55* 1.60*  CALCIUM 8.9 9.2   Liver Function Tests  Recent Labs  06/15/16 0319  AST 21  ALT 16*  ALKPHOS 77  BILITOT 0.9  PROT 7.3  ALBUMIN 3.0*   No results for input(s): LIPASE, AMYLASE in the last 72 hours. Cardiac Enzymes No results for input(s): CKTOTAL, CKMB, CKMBINDEX, TROPONINI in the last 72 hours. BNP Invalid input(s): POCBNP D-Dimer No results for input(s): DDIMER in the last 72 hours. Hemoglobin A1C No results for input(s): HGBA1C in the last 72 hours. Fasting Lipid Panel No results for input(s):  CHOL, HDL, LDLCALC, TRIG, CHOLHDL, LDLDIRECT in the last 72 hours. Thyroid Function Tests  Recent Labs  06/14/16 1113  TSH 0.687    Telemetry    Atrial fibrillation.  Rates mostly <100 bpm.  Some episodes up to 140.  Occasional PVCs.  - Personally Reviewed  ECG    n/a  Radiology    No results found.  Cardiac Studies   Echo 06/11/16: Study Conclusions  - Left ventricle: The cavity size was normal. There was moderate   concentric hypertrophy. Systolic function was normal. The   estimated ejection fraction was in the range of 60% to 65%. Wall   motion was normal; there were no regional wall motion   abnormalities. - Aortic valve: Trileaflet; moderately thickened, moderately   calcified leaflets. - Mitral valve: Calcified annulus. There was trivial regurgitation. - Left atrium: The atrium was moderately dilated. Volume/bsa, ES   (1-plane Simpson&'s, A4C): 47.5 ml/m^2.  Patient Profile     [phone] with CAD s/p PCI, permanent atrial fibrillation, CKD III, hypertension, hyperlipidemia, diabetes and morbid obesity here with acute on chronic diastolic heart failure.    Assessment & Plan    # Acute on chronic diastolic heart failure:  # CKD III: Mr. Afonso remains volume overloaded.  He was net negative 3.5L yesterday.  Creatinine increasing.  He was switched to torsemide 60mg  bid today.  We will hold spironolactone 12.5mg  daily until renal function starts to improve.   # Longstanding persistent atrial fibrillation: Rates mostly well-controlled on metoprolol.  Continue to Xarelto.  Watch dose closely as GFR fluctuates.   # CAD s/p PCI:  # Hyperlipidemia: Not an active issue.  Continue aspirin, atorvastatin and metoprolol.  # Hypertension: Controlled on metoprolol.  # Morbid obesity: encourage ambulation.  Signed, , MD  06/16/2016, 1:33 PM

## 2016-06-16 NOTE — Hospital Discharge Follow-Up (Signed)
Transitional Care Clinic at [facility]:  Patient known to [facility] and [facility]. PCP: Dr. Julien Nordmann.  Attempted to meet with patient again to follow-up and determine if patient wanting to receive post-discharge case management and medical management services with the [facility]; however, unable to speak with patient as he was sleeping soundly. Will attempt to follow-up with patient at a later time.

## 2016-06-16 NOTE — Hospital Discharge Follow-Up (Signed)
Transitional Care Clinic Care Coordination Note:  Admit date:  06/09/16 Discharge date: TBD Discharge Disposition: Home when stable Patient contact: [phone] Emergency contact(s): Edmon Crape (sister)-#[phone]  This Case Manager reviewed patient's EMR and determined patient would benefit from post-discharge medical management and chronic care management services through the [facility]. Patient has a history of CHF, atrial fibrillation, CKD Stage 3, diabetes mellitus 2, HTN, OSA. Patient has had four inpatient admissions and 1 ED visit in the last year. This Case Manager met with patient to discuss the services and medical management that can be provided at the [facility]. Patient verbalized understanding and agreed to receive post-discharge care at the [facility].   Patient scheduled for Transitional Care appointment on 06/25/16 at 1000 with Dr. Jarold Song.  Clinic information and appointment time provided to patient. Appointment information also placed on AVS.  Assessment:       Home Environment: Patient lives with his sister in a private residence.       Support System: sister       Level of functioning: independent       Home DME: Patient has home oxygen (2L PRN-[facility] is oxygen supplier), walker (does not use), and a CPAP machine that he has misplaced.       Home care services: none       Transportation: Patient states he has difficulty getting to his medical appointments. Has Medicaid as secondary insurance so would benefit from contacting [facility] transportation to determine if eligible for transportation services. Provided patient with phone number; patient appreciative. Patient indicated he plans to contact [facility] tomorrow to complete transportation assessment. May need clinic transportation assistance to appointment if unable to complete eligibility process with Medicaid transportation services prior  to [facility] appointment. Will determine at post-discharge follow-up phone call.        Food/Nutrition: Patient indicates he has access to needed food.        Medications: Patient obtains his medications from [facility] on [address]. Patient denies problems affording needed medications.        Identified Barriers: transportation to appointments        PCP: Dr. Charlotta [facility] [place] services:        Services communicated to Marvetta Gibbons, RN CM

## 2016-06-16 NOTE — Progress Notes (Signed)
Inpatient Diabetes Program Recommendations  AACE/ADA: New Consensus Statement on Inpatient Glycemic Control (2015)  Target Ranges:  Prepandial:   less than 140 mg/dL      Peak postprandial:   less than 180 mg/dL (1-2 hours)      Critically ill patients:  140 - 180 mg/dL   Lab Results  Component Value Date   GLUCAP 284 (H) 06/16/2016   HGBA1C 8.1 (H) 06/10/2016  Results for [name], [name] (MRN [id number]) as of 06/16/2016 10:18  Ref. Range 06/15/2016 06:30 06/15/2016 11:05 06/15/2016 15:59 06/15/2016 21:28 06/16/2016 06:20  Glucose-Capillary Latest Ref Range: 65 - 99 mg/dL 518 (H) 841 (H) 660 (H) 176 (H) 284 (H)   Much improved blood sugars. FBS and post-prandials still above goal of < 180 mg/dL  Review of Glycemic Control Increase Lantus to 58 units bid Increase Novolog to 17 units tidwc.  Will continue to follow. Thank you. Ailene Ards, RD, LDN, CDE Inpatient Diabetes Coordinator [phone]

## 2016-06-16 NOTE — Progress Notes (Signed)
PROGRESS NOTE    [name]  PJK:[id number] DOB: 1951-01-18 DOA: 06/09/2016 PCP: [name], MD   Brief Narrative: 65 yo male with morbid obesity, DM2, Chronic Afib on Xarelto, CAD s/p PCI and diastolic HF admitted with several days of SOB with exertion, 20 lb weight gain due to acute exacerbation of diastolic heart failure.   Assessment & Plan:   # Acute on chronic diastolic heart failure: Holding  IV Lasix, now on torsemide  aldactone.   Responding well with diuretics. creatinine increasing Weight 381>367>365   pounds since admission  -Echocardiogram showed left ventricular ejection fraction of 60-65%, no regional wall motion abnormalities.  Cardiology managing diuretics Follow renal function closely  #Chronic atrial fibrillation: Continue metoprolol for rate control and systemic anticoagulation with xarelto.    #Essential hypertension, benign: Blood pressure acceptable. Continue to monitor  #Chronic kidney disease stage III: Serum creatinine level around baseline 1.4. Continue diuretics per cardiology recommendations. Currently on torsemide, creatinine trending up, therefore holding discharge    # Diabetes mellitus type 2 in obes-continues to remain uncontrolled, increase Lantus to 65 units twice a day, increased premeal NovoLog .Hemoglobin A1c 8.1.   #Bilateral lower extremity chronic venous stasis changes and underlying cellulitis: Patient has scaly weeping bilateral lower extremity skin, mostly exacerbated by edema and has possible infection. Continue doxycycline, added  zosyn due to slow improvement. White blood cell count slightly better. Complaining of bilateral lower extremity pain, added tramadol for pain control. I continue oxycodone and added OxyContin for optimal pain control, monitor for oversedation   #  Obstructive sleep apnea- unable to tolerate c-pap   # CAD S/P percutaneous coronary angioplasty last cardiac catheterization in July 2015 showed a 40-50%  mid LAD, 50% circumflex and 30% RCA. Ejection fraction 65%.  # Morbid Obesity: Body mass index is 49.83 kg/m.  #hyponatremia: due to lasix ,chf , place on fluid restriction. Improving   DVT prophylaxis: Systemic anticoagulation Code Status: Full code Family Communication: No family present at bedside Disposition Plan: Likely discharge home in 1-2 days, pending renal function    Consultants:   Cardiology  Procedures: Echo Antimicrobials: Doxycycline  Subjective: cbg running high      Objective: Vitals:   06/14/16 2057 06/15/16 0524 06/15/16 1324 06/15/16 2129  BP: (!) 114/45 (!) 126/57 111/67 (!) 132/47  Pulse: 87 72 73 73  Resp: 20 20 18 20   Temp: 97.5 F (36.4 C) 97.9 F (36.6 C) 98.4 F (36.9 C) 97.8 F (36.6 C)  TempSrc: Oral Oral Oral Oral  SpO2: 96% 95% 100% 95%  Weight:  (!) 165.8 kg (365 lb 8 oz)      Intake/Output Summary (Last 24 hours) at 06/16/16 1216 Last data filed at 06/16/16 1110  Gross per 24 hour  Intake              960 ml  Output             4300 ml  Net            -3340 ml   Filed Weights   06/13/16 0419 06/14/16 0532 06/15/16 0524  Weight: (!) 167.6 kg (369 lb 9.6 oz) (!) 166.7 kg (367 lb 6.4 oz) (!) 165.8 kg (365 lb 8 oz)    Examination:  General exam: Obese male sitting on chair looks comfortable.  Respiratory system: Clear to auscultation. Respiratory effort normal. No wheezing or crackle Cardiovascular system: S1 & S2 heard, RRR.   Gastrointestinal system: Abdomen is  soft and  nontender. Normal bowel sounds heard. Central nervous system: Alert and oriented. No focal neurological deficits. Extremities: Bilateral lower extremity pitting edema with chronic venous stasis skin changes, scattered open wound with weeping serous liquid. Skin: Bilateral lower extremity open skin wound with clear liquid drainage likely due to edema.  Psychiatry: Judgement and insight appear normal. Mood & affect appropriate.     Data Reviewed: I have  personally reviewed following labs and imaging studies  CBC:  Recent Labs Lab 06/11/16 0217 06/12/16 0229 06/13/16 0324 06/14/16 0303 06/15/16 0319  WBC 13.5* 14.8* 14.1* 14.8* 12.5*  HGB 10.4* 10.2* 9.9* 10.6* 10.3*  HCT 34.3* 32.9* 33.1* 33.6* 33.6*  MCV 77.4* 76.9* 77.3* 75.7* 75.0*  PLT 332 325 334 338 364   Basic Metabolic Panel:  Recent Labs Lab 06/12/16 0229 06/13/16 0324 06/14/16 0303 06/15/16 0319 06/16/16 0526  NA 134* 133* 130* 129* 134*  K 3.6 3.9 3.9 4.3 4.1  CL 96* 95* 91* 90* 92*  CO2 26 28 28  34* 30  GLUCOSE 228* 331* 231* 330* 254*  BUN 15 17 22* 22* 22*  CREATININE 1.29* 1.33* 1.49* 1.55* 1.60*  CALCIUM 8.4* 8.5* 8.7* 8.9 9.2   GFR: Estimated Creatinine Clearance: 74.5 mL/min (by C-G formula based on SCr of 1.6 mg/dL (H)). Liver Function Tests:  Recent Labs Lab 06/15/16 0319  AST 21  ALT 16*  ALKPHOS 77  BILITOT 0.9  PROT 7.3  ALBUMIN 3.0*   No results for input(s): LIPASE, AMYLASE in the last 168 hours. No results for input(s): AMMONIA in the last 168 hours. Coagulation Profile: No results for input(s): INR, PROTIME in the last 168 hours. Cardiac Enzymes:  Recent Labs Lab 06/10/16 1300 06/10/16 1541 06/10/16 2137  TROPONINI 0.06* 0.03* 0.05*   BNP (last 3 results) No results for input(s): PROBNP in the last 8760 hours. HbA1C: No results for input(s): HGBA1C in the last 72 hours. CBG:  Recent Labs Lab 06/15/16 1105 06/15/16 1559 06/15/16 2128 06/16/16 0620 06/16/16 1108  GLUCAP 414* 298* 176* 284* 389*   Lipid Profile: No results for input(s): CHOL, HDL, LDLCALC, TRIG, CHOLHDL, LDLDIRECT in the last 72 hours. Thyroid Function Tests:  Recent Labs  06/14/16 1113  TSH 0.687   Anemia Panel: No results for input(s): VITAMINB12, FOLATE, FERRITIN, TIBC, IRON, RETICCTPCT in the last 72 hours. Sepsis Labs:  Recent Labs Lab 06/09/16 2244 06/10/16 0144 06/10/16 0150 06/10/16 0410  LATICACIDVEN 2.56* 2.3* 2.53* 3.5*     No results found for this or any previous visit (from the past 240 hour(s)).       Radiology Studies: No results found.      Scheduled Meds: . aspirin EC  81 mg Oral Daily  . atorvastatin  80 mg Oral q1800  . doxycycline  100 mg Oral Q12H  . gabapentin  600 mg Oral BID  . hydrocerin   Topical BID  . insulin aspart  0-15 Units Subcutaneous TID WC  . insulin aspart  0-5 Units Subcutaneous QHS  . insulin aspart  15 Units Subcutaneous TID WC  . insulin glargine  55 Units Subcutaneous BID  . metoprolol  50 mg Oral BID  . oxyCODONE  10 mg Oral Q12H  . piperacillin-tazobactam (ZOSYN)  IV  3.375 g Intravenous Q8H  . potassium chloride  40 mEq Oral BID  . rivaroxaban  20 mg Oral Q supper  . sodium chloride flush  10-40 mL Intracatheter Q12H  . sodium chloride flush  3 mL Intravenous Q12H  . spironolactone  12.5 mg Oral Daily  . torsemide  60 mg Oral BID   Continuous Infusions:   LOS: 6 days    Richarda Overlie, MD [name] Pager  [phone]  If 7PM-7AM, please contact night-coverage [email or web address] Password [email or web address] 06/16/2016, 12:16 PM

## 2016-06-17 ENCOUNTER — Telehealth: Payer: Self-pay | Admitting: Cardiology

## 2016-06-17 ENCOUNTER — Other Ambulatory Visit: Payer: Medicare Other

## 2016-06-17 LAB — COMPREHENSIVE METABOLIC PANEL
ALT: 16 U/L — AB (ref 17–63)
AST: 18 U/L (ref 15–41)
Albumin: 3.1 g/dL — ABNORMAL LOW (ref 3.5–5.0)
Alkaline Phosphatase: 78 U/L (ref 38–126)
Anion gap: 12 (ref 5–15)
BILIRUBIN TOTAL: 0.6 mg/dL (ref 0.3–1.2)
BUN: 22 mg/dL — AB (ref 6–20)
CHLORIDE: 92 mmol/L — AB (ref 101–111)
CO2: 32 mmol/L (ref 22–32)
CREATININE: 1.51 mg/dL — AB (ref 0.61–1.24)
Calcium: 9.2 mg/dL (ref 8.9–10.3)
GFR calc Af Amer: 55 mL/min — ABNORMAL LOW (ref >60)
GFR, EST NON AFRICAN AMERICAN: 47 mL/min — AB (ref >60)
GLUCOSE: 170 mg/dL — AB (ref 65–99)
Potassium: 4 mmol/L (ref 3.5–5.1)
Sodium: 136 mmol/L (ref 135–145)
Total Protein: 7.6 g/dL (ref 6.5–8.1)

## 2016-06-17 LAB — CBC
HEMATOCRIT: 34.8 % — AB (ref 39.0–52.0)
Hemoglobin: 10.9 g/dL — ABNORMAL LOW (ref 13.0–17.0)
MCH: 23.8 pg — ABNORMAL LOW (ref 26.0–34.0)
MCHC: 31.3 g/dL (ref 30.0–36.0)
MCV: 76 fL — AB (ref 78.0–100.0)
PLATELETS: 374 10*3/uL (ref 150–400)
RBC: 4.58 MIL/uL (ref 4.22–5.81)
RDW: 17 % — ABNORMAL HIGH (ref 11.5–15.5)
WBC: 12.9 10*3/uL — AB (ref 4.0–10.5)

## 2016-06-17 LAB — GLUCOSE, CAPILLARY
GLUCOSE-CAPILLARY: 149 mg/dL — AB (ref 65–99)
Glucose-Capillary: 317 mg/dL — ABNORMAL HIGH (ref 65–99)
Glucose-Capillary: 239 mg/dL — ABNORMAL HIGH (ref 65–99)
Glucose-Capillary: 189 mg/dL — ABNORMAL HIGH (ref 65–99)

## 2016-06-17 MED ORDER — TORSEMIDE 20 MG PO TABS: 60.0000 mg | ORAL_TABLET | Freq: Two times a day (BID) | ORAL | 2 refills | Status: DC

## 2016-06-17 MED ORDER — POTASSIUM CHLORIDE CRYS ER 10 MEQ PO TBCR: 30.0000 meq | EXTENDED_RELEASE_TABLET | Freq: Two times a day (BID) | ORAL | 1 refills | Status: DC

## 2016-06-17 MED ORDER — ASPIRIN 81 MG PO TBEC: 81.0000 mg | DELAYED_RELEASE_TABLET | Freq: Every day | ORAL | 2 refills | Status: AC

## 2016-06-17 MED ORDER — DOXYCYCLINE HYCLATE 100 MG PO TABS: 100.0000 mg | ORAL_TABLET | Freq: Two times a day (BID) | ORAL | 0 refills | Status: AC

## 2016-06-17 MED ORDER — INSULIN GLARGINE 100 UNIT/ML ~~LOC~~ SOLN: 50.0000 [IU] | Freq: Two times a day (BID) | SUBCUTANEOUS | 11 refills | Status: DC

## 2016-06-17 MED ORDER — SPIRONOLACTONE 25 MG PO TABS: 12.5000 mg | ORAL_TABLET | Freq: Every day | ORAL | 3 refills | Status: DC

## 2016-06-17 NOTE — Progress Notes (Signed)
PT Cancellation Note  Patient Details Name: [name] MRN: [id number] DOB: July 16, 1950   Cancelled Treatment:    Reason Eval/Treat Not Completed: PT screened, no needs identified, will sign off; patient seen on 12/1 and discharged due to at baseline for mobility.  Patient relates no change in functional status.  Did offer to practice stairs as he has stairs to enter his home.  He declined.  Will sign off.     [name] 06/17/2016, 9:32 AM  Sheran Lawless, PT [phone] 06/17/2016

## 2016-06-17 NOTE — Progress Notes (Addendum)
Patient Name: [name] Date of Encounter: 06/17/2016  Primary Cardiologist: Dr. Janae Sauce Problem List     Active Problems:   Hyperlipidemia   HYPOKALEMIA   Essential hypertension, benign   Atrial fibrillation   Obstructive sleep apnea- unable to tolerate c-pap   CAD S/P percutaneous coronary angioplasty   Chronic anticoagulation- Coumadin   CKD (chronic kidney disease) stage 3, GFR 30-59 ml/min   Acute on chronic diastolic heart failure (HCC)   Diabetes mellitus type 2 in obese (HCC)   Lactic acidosis   CHF exacerbation (HCC)    Subjective   Breathing and edema are better.  He is less short of breath when walking.   Inpatient Medications    Scheduled Meds: . aspirin EC  81 mg Oral Daily  . atorvastatin  80 mg Oral q1800  . doxycycline  100 mg Oral Q12H  . gabapentin  600 mg Oral BID  . hydrocerin   Topical BID  . insulin aspart  0-15 Units Subcutaneous TID WC  . insulin aspart  0-5 Units Subcutaneous QHS  . insulin aspart  15 Units Subcutaneous TID WC  . insulin glargine  65 Units Subcutaneous BID  . metoprolol  50 mg Oral BID  . oxyCODONE  10 mg Oral Q12H  . piperacillin-tazobactam (ZOSYN)  IV  3.375 g Intravenous Q8H  . potassium chloride  40 mEq Oral BID  . rivaroxaban  20 mg Oral Q supper  . sodium chloride flush  10-40 mL Intracatheter Q12H  . sodium chloride flush  3 mL Intravenous Q12H  . torsemide  60 mg Oral BID   Continuous Infusions:  PRN Meds: sodium chloride, acetaminophen, morphine injection, ondansetron (ZOFRAN) IV, oxyCODONE, sodium chloride flush, sodium chloride flush, traMADol   Vital Signs    Vitals:   06/15/16 2129 06/16/16 1330 06/16/16 2053 06/17/16 0546  BP: (!) 132/47 (!) 131/50 (!) 144/73 (!) 104/50  Pulse: 73 72 93 67  Resp: 20 20 20 20   Temp: 97.8 F (36.6 C) 97.9 F (36.6 C) 97.4 F (36.3 C) 98.2 F (36.8 C)  TempSrc: Oral Oral Oral Oral  SpO2: 95% 95% 95% 99%  Weight:    (!) 164.5 kg (362 lb 11.2 oz)      Intake/Output Summary (Last 24 hours) at 06/17/16 0904 Last data filed at 06/17/16 14/07/17  Gross per 24 hour  Intake             1080 ml  Output             1375 ml  Net             -295 ml   Filed Weights   06/14/16 0532 06/15/16 0524 06/17/16 0546  Weight: (!) 166.7 kg (367 lb 6.4 oz) (!) 165.8 kg (365 lb 8 oz) (!) 164.5 kg (362 lb 11.2 oz)    Physical Exam   GEN: Well nourished, well developed, in no acute distress. Morbidly obese HEENT: Grossly normal.  Neck: Supple, JVD difficult to assess secondary to body habitus. No carotid bruits, or masses. Cardiac: Irregularly irregular. No murmurs, rubs, or gallops. No clubbing, cyanosis, 1+ pitting edema in the feet.  Legs wrapped with compressive dressing.  Radials/DP/PT 2+ and equal bilaterally.  Respiratory:  Respirations regular and unlabored, clear to auscultation bilaterally. GI: Soft, nontender, nondistended, BS + x 4. MS: no deformity or atrophy. Skin: warm and dry, no rash. Neuro:  Strength and sensation are intact. Psych: AAOx3.  Normal affect.  Labs  CBC  Recent Labs  06/15/16 0319 06/17/16 0339  WBC 12.5* 12.9*  HGB 10.3* 10.9*  HCT 33.6* 34.8*  MCV 75.0* 76.0*  PLT 364 374   Basic Metabolic Panel  Recent Labs  06/16/16 0526 06/17/16 0339  NA 134* 136  K 4.1 4.0  CL 92* 92*  CO2 30 32  GLUCOSE 254* 170*  BUN 22* 22*  CREATININE 1.60* 1.51*  CALCIUM 9.2 9.2   Liver Function Tests  Recent Labs  06/15/16 0319 06/17/16 0339  AST 21 18  ALT 16* 16*  ALKPHOS 77 78  BILITOT 0.9 0.6  PROT 7.3 7.6  ALBUMIN 3.0* 3.1*   No results for input(s): LIPASE, AMYLASE in the last 72 hours. Cardiac Enzymes No results for input(s): CKTOTAL, CKMB, CKMBINDEX, TROPONINI in the last 72 hours. BNP Invalid input(s): POCBNP D-Dimer No results for input(s): DDIMER in the last 72 hours. Hemoglobin A1C No results for input(s): HGBA1C in the last 72 hours. Fasting Lipid Panel No results for input(s): CHOL,  HDL, LDLCALC, TRIG, CHOLHDL, LDLDIRECT in the last 72 hours. Thyroid Function Tests  Recent Labs  06/14/16 1113  TSH 0.687    Telemetry    Atrial fibrillation.  Rates <100 bpm.  Occasional PVCs.  - Personally Reviewed  ECG    n/a  Radiology    No results found.  Cardiac Studies   Echo 06/11/16: Study Conclusions  - Left ventricle: The cavity size was normal. There was moderate   concentric hypertrophy. Systolic function was normal. The   estimated ejection fraction was in the range of 60% to 65%. Wall   motion was normal; there were no regional wall motion   abnormalities. - Aortic valve: Trileaflet; moderately thickened, moderately   calcified leaflets. - Mitral valve: Calcified annulus. There was trivial regurgitation. - Left atrium: The atrium was moderately dilated. Volume/bsa, ES   (1-plane Simpson&'s, A4C): 47.5 ml/m^2.  Patient Profile     [phone] with CAD s/p PCI, permanent atrial fibrillation, CKD III, hypertension, hyperlipidemia, diabetes and morbid obesity here with acute on chronic diastolic heart failure.    Assessment & Plan    # Acute on chronic diastolic heart failure:  # CKD III: Volume status is improving. He was net negative 600 mL yesterday on torsemide.  Creatinine improving.   We will wait before adding spironolactone back until after he is seen in clinic.  He will need a BMP at follow up.  # Longstanding persistent atrial fibrillation: Rates well-controlled on metoprolol.  Continue to Xarelto.  This patients CHA2DS2-VASc Score and unadjusted Ischemic Stroke Rate (% per year) is equal to 4.8 % stroke rate/year from a score of 4  Above score calculated as 1 point each if present [CHF, HTN, DM, Vascular=MI/PAD/Aortic Plaque, Age if 65-74, or Male] Above score calculated as 2 points each if present [Age > 75, or Stroke/TIA/TE]  # CAD s/p PCI:  # Hyperlipidemia: Not an active issue.  Continue aspirin, atorvastatin and metoprolol.  #  Hypertension: Controlled on metoprolol.  # Morbid obesity: encourage ambulation.  # Dispo: Stable for discharge from a cardiology standpoint.  We will arrange 1 week follow up.  Signed, Chilton Si, MD  06/17/2016, 9:04 AM

## 2016-06-17 NOTE — Progress Notes (Signed)
Pt seems very irritated that he is being discharged today.  He states that his sister is not available to pick him up until 9:00pm this evening.  Social work in and offered taxi pick up and pt declined, stating that his sister will be here tonight.  He is very irritated that he is being discharged today and that the MD is not updating his medication list with narcotic dosages that he has been receiving as inpatient.  Per Dr. Susie Cassette, she discussed this at length with pt this morning.  Pt also states concern that he will not be able to change his BLE dressings at home by himself, and doubts that his sister will be able to be of much assistance. Per case management note, pt declined home health per their discussion this morning.

## 2016-06-17 NOTE — Progress Notes (Signed)
OT Cancellation Note  Patient Details Name: [name] MRN: [id number] DOB: 1951-01-28   Cancelled Treatment:    Reason Eval/Treat Not Completed: OT screened, no needs identified, will sign off. Pt evaluated for OT on 12/1 and signed off. Pt reports he continues to feel at his functional baseline without any questions or concerns for OT. Will screen and sign off at this time as no acute OT needs are identified. Thank you for this referral.  Gaye Alken M.S., OTR/L Pager: [phone]  06/17/2016, 9:48 AM

## 2016-06-17 NOTE — Care Management Note (Signed)
Case Management Note Donn Pierini RN, BSN Unit 2W-Case Manager [phone]  Patient Details  Name: [name] MRN: [id number] Date of Birth: [birth year]  Subjective/Objective:   Pt admitted with Acute on Chronic HF                 Action/Plan: PTA pt lived at home with sister - per PT eval no recommendation for continued F/u- CM to follow for any further d/c needs.   Expected Discharge Date:                 Expected Discharge Plan:  Home/Self Care  In-House Referral:     Discharge planning Services  CM Consult  Post Acute Care Choice:  Home Health Choice offered to:  Patient  DME Arranged:    DME Agency:     HH Arranged:  Patient Refused HH Agency:  NA  Status of Service:  Completed, signed off  If discussed at Long Length of Stay Meetings, dates discussed:  12/7  Discharge Disposition: home/self care   Additional Comments:  06/17/16- 1030- [name] RN, CM- orders for [facility] placed per MD- spoke with pt at bedside- per conversation pt states that he is close to baseline and does not feel like he would benefit from [facility] services- pt on RA this am. Pt has politely refused HH referral at this time- [facility].   Darrold Span, RN 06/17/2016, 12:01 PM

## 2016-06-17 NOTE — Discharge Summary (Addendum)
Physician Discharge Summary  [name] MRN: [id number] DOB/AGE: [birth year] 64 y.o.  PCP: [name], MD   Admit date: 06/09/2016 Discharge date: 06/17/2016  Discharge Diagnoses:    Active Problems:   Hyperlipidemia   HYPOKALEMIA   Essential hypertension, benign   Atrial fibrillation   Obstructive sleep apnea- unable to tolerate c-pap   CAD S/P percutaneous coronary angioplasty   Chronic anticoagulation- Coumadin   CKD (chronic kidney disease) stage 3, GFR 30-59 ml/min   Acute on chronic diastolic heart failure (HCC)   Diabetes mellitus type 2 in obese (HCC)   Lactic acidosis   CHF exacerbation (HCC)    Follow-up recommendations Follow-up with PCP in 3-5 days , including all  additional recommended appointments as below Follow-up CBC, CMP in 3-5 days hold spironolactone 12.76m daily until renal function starts to improve Cardiology will arrange 1 week follow up      Current Discharge Medication List    START taking these medications   Details  aspirin EC 81 MG EC tablet Take 1 tablet (81 mg total) by mouth daily. Qty: 30 tablet, Refills: 2    doxycycline (VIBRA-TABS) 100 MG tablet Take 1 tablet (100 mg total) by mouth 2 (two) times daily. Qty: 20 tablet, Refills: 0    insulin glargine (LANTUS) 100 UNIT/ML injection Inject 0.5 mLs (50 Units total) into the skin 2 (two) times daily. Qty: 10 mL, Refills: 11    torsemide (DEMADEX) 20 MG tablet Take 3 tablets (60 mg total) by mouth 2 (two) times daily. Qty: 60 tablet, Refills: 2      CONTINUE these medications which have CHANGED   Details  potassium chloride SA (K-DUR,KLOR-CON) 10 MEQ tablet Take 3 tablets (30 mEq total) by mouth 2 (two) times daily. Qty: 200 tablet, Refills: 1    spironolactone (ALDACTONE) 25 MG tablet Take 0.5 tablets (12.5 mg total) by mouth daily. Qty: 90 tablet, Refills: 3      CONTINUE these medications which have NOT CHANGED   Details  atorvastatin (LIPITOR) 80 MG tablet  Take 1 tablet (80 mg total) by mouth daily. Qty: 30 tablet, Refills: 10    gabapentin (NEURONTIN) 300 MG capsule Take 2 capsules (600 mg total) by mouth 2 (two) times daily. Qty: 60 capsule, Refills: 3    Hypromellose (NATURAL BALANCE TEARS OP) Place 1 drop into both eyes daily as needed (for dry eyes).     metoprolol (LOPRESSOR) 50 MG tablet Take 50 mg by mouth 2 (two) times daily. TWO TABLETS TWICE A DAY    Multiple Vitamin (MULTIVITAMIN WITH MINERALS) TABS Take 1 tablet by mouth daily.    oxyCODONE 20 MG TABS Take 1 tablet (20 mg total) by mouth every 6 (six) hours as needed for severe pain. Qty: 30 tablet, Refills: 0    rivaroxaban (XARELTO) 20 MG TABS tablet Take 1 tablet (20 mg total) by mouth daily with supper. Qty: 30 tablet, Refills: 3    BD INSULIN SYRINGE ULTRAFINE 31G X 15/64" 0.3 ML MISC USE TO IJNECT INSULIN 3 TIMES DAILY. Refills: 5    Blood Glucose Monitoring Suppl (ACCU-CHEK AVIVA PLUS) w/Device KIT Use to check blood sugar 6 times per day dx code E11.65 Qty: 1 kit, Refills: 0    HUMALOG KWIKPEN 200 UNIT/ML SOPN INJECT 80 TO 100 UNITS 3 TIMES A DAY DEPENDING ON MEAL SIZE Qty: 48 pen, Refills: 0    hydrocerin (EUCERIN) CREA Apply 1 application topically 3 (three) times daily. legs Qty: 454 g, Refills: 3  mupirocin cream (BACTROBAN) 2 % Apply topically 2 (two) times daily. Qty: 15 g, Refills: 0      STOP taking these medications     furosemide (LASIX) 80 MG tablet      Insulin Glargine (TOUJEO SOLOSTAR) 300 UNIT/ML SOPN      lisinopril (PRINIVIL,ZESTRIL) 10 MG tablet          Discharge Condition: Stable   Discharge Instructions Get Medicines reviewed and adjusted: Please take all your medications with you for your next visit with your Primary MD  Please request your Primary MD to go over all hospital tests and procedure/radiological results at the follow up, please ask your Primary MD to get all Hospital records sent to his/her office.  If you  experience worsening of your admission symptoms, develop shortness of breath, life threatening emergency, suicidal or homicidal thoughts you must seek medical attention immediately by calling 911 or calling your MD immediately if symptoms less severe.  You must read complete instructions/literature along with all the possible adverse reactions/side effects for all the Medicines you take and that have been prescribed to you. Take any new Medicines after you have completely understood and accpet all the possible adverse reactions/side effects.   Do not drive when taking Pain medications.   Do not take more than prescribed Pain, Sleep and Anxiety Medications  Special Instructions: If you have smoked or chewed Tobacco in the last 2 yrs please stop smoking, stop any regular Alcohol and or any Recreational drug use.  Wear Seat belts while driving.  Please note  You were cared for by a hospitalist during your hospital stay. Once you are discharged, your primary care physician will handle any further medical issues. Please note that NO REFILLS for any discharge medications will be authorized once you are discharged, as it is imperative that you return to your primary care physician (or establish a relationship with a primary care physician if you do not have one) for your aftercare needs so that they can reassess your need for medications and monitor your lab values.     No Known Allergies    Disposition: Home with home health   Consults: Cardiology    Significant Diagnostic Studies:  Dg Chest 2 View  Result Date: 06/09/2016 CLINICAL DATA:  Shortness of breath, leg swelling for 1 week EXAM: CHEST  2 VIEW COMPARISON:  Chest x-ray of 07/08/2015 FINDINGS: The patient could not stand and the patient's chin overlies the upper lung feels obscuring detail. However on the best images possible, no pneumonia is seen. No effusion is noted. Mediastinal and hilar contours are unremarkable. Mild  pulmonary vascular congestion cannot be excluded. The heart is mildly enlarged. No bony abnormality is seen. IMPRESSION: 1. Cardiomegaly.  Cannot exclude mild pulmonary vascular congestion. 2. No pneumonia or effusion. Electronically Signed   By: Ivar Drape M.D.   On: 06/09/2016 16:10   US Renal  Result Date: 06/10/2016 CLINICAL DATA:  Acute onset of renal insufficiency. Initial encounter. EXAM: RENAL / URINARY TRACT ULTRASOUND COMPLETE COMPARISON:  CT of the abdomen and pelvis performed 09/15/2015 FINDINGS: Right Kidney: Length: 13.8 cm. Echogenicity within normal limits. No mass or hydronephrosis visualized. Left Kidney: Length: 13.4 cm. Echogenicity within normal limits. No mass or hydronephrosis visualized. Bladder: Appears normal for degree of bladder distention. IMPRESSION: Unremarkable renal ultrasound.  No evidence of hydronephrosis. Electronically Signed   By: Garald Balding M.D.   On: 06/10/2016 03:28    2-D echo LV EF: 60% -  65%  ------------------------------------------------------------------- Indications:      CHF - 428.0.  ------------------------------------------------------------------- History:   PMH:  Cardiomegaly. Atrial fibrillation. CKD stage III. DMT2. Essential hypertension. Hyperlipidemia. Hypokalemia. OSA. Lactic acidosis.  ------------------------------------------------------------------- Study Conclusions  - Left ventricle: The cavity size was normal. There was moderate   concentric hypertrophy. Systolic function was normal. The   estimated ejection fraction was in the range of 60% to 65%. Wall   motion was normal; there were no regional wall motion   abnormalities. - Aortic valve: Trileaflet; moderately thickened, moderately   calcified leaflets. - Mitral valve: Calcified annulus. There was trivial regurgitation. - Left atrium: The atrium was moderately dilated. Volume/bsa, ES   (1-plane Simpson&'s, A4C): 47.5 ml/m^2.  Impressions:  -  Compared to the prior study, there has been no    Filed Weights   06/14/16 0532 06/15/16 0524 06/17/16 0546  Weight: (!) 166.7 kg (367 lb 6.4 oz) (!) 165.8 kg (365 lb 8 oz) (!) 164.5 kg (362 lb 11.2 oz)     Microbiology: No results found for this or any previous visit (from the past 240 hour(s)).     Blood Culture    Component Value Date/Time   SDES SCROTUM 12/27/2015 1820   SPECREQUEST NONE 12/27/2015 1820   CULT  12/27/2015 1820    NORMAL SKIN FLORA MIXED ANAEROBIC FLORA PRESENT.  CALL LAB IF FURTHER IID REQUIRED.    REPTSTATUS 01/02/2016 FINAL 12/27/2015 1820      Labs: Results for orders placed or performed during the hospital encounter of 06/09/16 (from the past 48 hour(s))  Glucose, capillary     Status: Abnormal   Collection Time: 06/15/16 11:05 AM  Result Value Ref Range   Glucose-Capillary 414 (H) 65 - 99 mg/dL   Comment 1 Notify RN    Comment 2 Document in Chart   Glucose, capillary     Status: Abnormal   Collection Time: 06/15/16  3:59 PM  Result Value Ref Range   Glucose-Capillary 298 (H) 65 - 99 mg/dL   Comment 1 Notify RN    Comment 2 Document in Chart   Glucose, capillary     Status: Abnormal   Collection Time: 06/15/16  9:28 PM  Result Value Ref Range   Glucose-Capillary 176 (H) 65 - 99 mg/dL  Basic metabolic panel     Status: Abnormal   Collection Time: 06/16/16  5:26 AM  Result Value Ref Range   Sodium 134 (L) 135 - 145 mmol/L   Potassium 4.1 3.5 - 5.1 mmol/L   Chloride 92 (L) 101 - 111 mmol/L   CO2 30 22 - 32 mmol/L   Glucose, Bld 254 (H) 65 - 99 mg/dL   BUN 22 (H) 6 - 20 mg/dL   Creatinine, Ser 1.60 (H) 0.61 - 1.24 mg/dL   Calcium 9.2 8.9 - 10.3 mg/dL   GFR calc non Af Amer 44 (L) >60 mL/min   GFR calc Af Amer 51 (L) >60 mL/min    Comment: (NOTE) The eGFR has been calculated using the CKD EPI equation. This calculation has not been validated in all clinical situations. eGFR's persistently <60 mL/min signify possible Chronic  Kidney Disease.    Anion gap 12 5 - 15  Glucose, capillary     Status: Abnormal   Collection Time: 06/16/16  6:20 AM  Result Value Ref Range   Glucose-Capillary 284 (H) 65 - 99 mg/dL  Glucose, capillary     Status: Abnormal   Collection Time: 06/16/16 11:08 AM  Result  Value Ref Range   Glucose-Capillary 389 (H) 65 - 99 mg/dL   Comment 1 Notify RN   Glucose, capillary     Status: Abnormal   Collection Time: 06/16/16  8:18 PM  Result Value Ref Range   Glucose-Capillary 359 (H) 65 - 99 mg/dL  Comprehensive metabolic panel     Status: Abnormal   Collection Time: 06/17/16  3:39 AM  Result Value Ref Range   Sodium 136 135 - 145 mmol/L   Potassium 4.0 3.5 - 5.1 mmol/L   Chloride 92 (L) 101 - 111 mmol/L   CO2 32 22 - 32 mmol/L   Glucose, Bld 170 (H) 65 - 99 mg/dL   BUN 22 (H) 6 - 20 mg/dL   Creatinine, Ser 1.51 (H) 0.61 - 1.24 mg/dL   Calcium 9.2 8.9 - 10.3 mg/dL   Total Protein 7.6 6.5 - 8.1 g/dL   Albumin 3.1 (L) 3.5 - 5.0 g/dL   AST 18 15 - 41 U/L   ALT 16 (L) 17 - 63 U/L   Alkaline Phosphatase 78 38 - 126 U/L   Total Bilirubin 0.6 0.3 - 1.2 mg/dL   GFR calc non Af Amer 47 (L) >60 mL/min   GFR calc Af Amer 55 (L) >60 mL/min    Comment: (NOTE) The eGFR has been calculated using the CKD EPI equation. This calculation has not been validated in all clinical situations. eGFR's persistently <60 mL/min signify possible Chronic Kidney Disease.    Anion gap 12 5 - 15  Glucose, capillary     Status: Abnormal   Collection Time: 06/17/16  6:20 AM  Result Value Ref Range   Glucose-Capillary 149 (H) 65 - 99 mg/dL     Lipid Panel     Component Value Date/Time   CHOL 127 03/03/2016 1431   TRIG 170.0 (H) 03/03/2016 1431   HDL 28.10 (L) 03/03/2016 1431   CHOLHDL 5 03/03/2016 1431   VLDL 34.0 03/03/2016 1431   LDLCALC 65 03/03/2016 1431   LDLDIRECT 82.1 08/31/2011 0954     Lab Results  Component Value Date   HGBA1C 8.1 (H) 06/10/2016   HGBA1C 8.9 03/03/2016   HGBA1C 8.7  10/14/2015        HPI   65 year old male with past medical history of DM, morbid obesity, coronary artery disease, permanent atrial fibrillation, chronic diastolic congestive heart failure, chronic stage III kidney disease for evaluation of acute on chronic diastolic congestive heart failure. Patient with history of atrial fibrillation with successful cardioversions. However more recently has not held sinus rhythm and is treated with rate control and anticoagulation. Patient had PCI of his right coronary artery in August 2012 in [place]. Patient's last cardiac catheterization in July 2015 showed a 40-50% mid LAD, 50% circumflex and 30% RCA. Ejection fraction 65%. Patient did develop contrast nephropathy. Echocardiogram May 2016 showed normal LV systolic function and mild left atrial enlargement. Patient admitted on November 29 with increasing weight gain, lower extremity edema and felt to have acute on chronic diastolic congestive heart failure. Cardiology  asked to evaluate. Patient states that for the 2 weeks prior to admission he gained approximately 20 pounds. He had increasing dyspnea on exertion, lower extremity edema and lower extremity weeping. No chest pain or syncope. He has had some improvement since admission  HOSPITAL COURSE:   # Acute on chronic diastolic heart failure: Initially diuresed with IV Lasix, creatinine started to trend upwards, cardiology held  IV Lasix 12/4, now on torsemide.   continue to  hold aldactone pending improvement in renal function.   Responding well with diuretics. Weight [phone]   pounds since admission  -Echocardiogram showed left ventricular ejection fraction of 60-65%, no regional wall motion abnormalities.  Cardiology managing diuretics Follow renal function closely  #Chronic atrial fibrillation: Continue metoprolol for rate control and systemic anticoagulation with xarelto.    #Essential hypertension, benign: Blood pressure acceptable. Continue to  monitor  #Chronic kidney disease stage III: Serum creatinine level around baseline 1.4. Continue diuretics per cardiology recommendations. Currently on torsemide, slight improvement in creatinine from 1.6->1.5. Fairly close to baseline     # Diabetes mellitus type 2 -remained uncontrolled despite up titration of Lantus, now on Lantus 50  units twice a day, patient is advised to resume his pre-meal sliding scale . Hemoglobin A1c 8.1.   #Bilateral lower extremity chronic venous stasis changes and underlying cellulitis: Patient has scaly weeping bilateral lower extremity skin, mostly exacerbated by edema and has possible infection. Started on doxycycline, added  zosyn due to slow improvement. White blood cell count slightly better. Patient continued to complain of bilateral lower extremity pain, requiring initiation of OxyContin for optimal pain control. Did not receive any prescriptions at discharge. Home health is being set up for continued wound care   #  Obstructive sleep apnea- unable to tolerate c-pap   # CAD S/P percutaneous coronary angioplasty last cardiac catheterization in July 2015 showed a 40-50% mid LAD, 50% circumflex and 30% RCA. Ejection fraction 65%.  # Morbid Obesity: Body mass index is 49.83 kg/m.  #hyponatremia: due to lasix ,chf , place on fluid restriction. Improving    Discharge Exam:   Blood pressure (!) 104/50, pulse 67, temperature 98.2 F (36.8 C), temperature source Oral, resp. rate 20, weight (!) 164.5 kg (362 lb 11.2 oz), SpO2 99 %.  General exam: Obese male sitting on chair looks comfortable.  Respiratory system: Clear to auscultation. Respiratory effort normal. No wheezing or crackle Cardiovascular system: S1 & S2 heard, RRR.   Gastrointestinal system: Abdomen is  soft and nontender. Normal bowel sounds heard. Central nervous system: Alert and oriented. No focal neurological deficits. Extremities: Bilateral lower extremity pitting edema with  chronic venous stasis skin changes, scattered open wound with weeping serous liquid. Skin: Bilateral lower extremity open skin wound with clear liquid drainage likely due to edema.  Psychiatry: Judgement and insight appear normal. Mood & affect appropriate    Follow-up Information    [facility] Follow up on 06/25/2016.   Why:  [facility] appointment on 06/25/16 at 10:00 am with Dr. Jarold Song. Contact information: [place] [address] [phone]          Signed: [name] 06/17/2016, 7:50 AM        Time spent >45 mins

## 2016-06-17 NOTE — Telephone Encounter (Signed)
New Message  TOC appt with Azalee Course on 12.18.17 @ 10 am from [employer] from hospital.

## 2016-06-18 ENCOUNTER — Telehealth: Payer: Self-pay

## 2016-06-18 NOTE — Telephone Encounter (Signed)
[facility] Post-discharge Follow-Up Phone Call:  Date of Discharge: 06/17/16 Principal Discharge Diagnosis(es): Acute on chronic diastolic heart failure, chronic kidney disease stage III, diabetes mellitus type 2, bilateral  lower extremity chronic venous stasis changes Post-discharge Communication: Attempt #1 to reach patient and complete post-discharge follow-up phone call. Call placed to #[phone]; unable to reach patient. HIPPA compliant voicemail left requesting return call. In addition, call placed to patient's emergency contact, Deidre Ala (sister); voicemail left requesting patient return call to this Case Manager when able. Call Completed: No

## 2016-06-18 NOTE — Telephone Encounter (Signed)
Left message to call back  

## 2016-06-21 ENCOUNTER — Telehealth: Payer: Self-pay

## 2016-06-21 ENCOUNTER — Ambulatory Visit: Payer: Medicare Other | Admitting: Dietician

## 2016-06-21 ENCOUNTER — Ambulatory Visit: Payer: Medicare Other | Admitting: Endocrinology

## 2016-06-21 NOTE — Telephone Encounter (Signed)
Attempted to reach pt by phone.  Number disconnected or not longer in service.

## 2016-06-21 NOTE — Telephone Encounter (Signed)
[facility] Post-discharge Follow-Up Phone Call:  Date of Discharge:06/17/2016 Principal Discharge Diagnosis(es): acute on chronic diastolic heart failure Post-discharge Communication: (Clearly document all attempts clearly and date contact made) Call placed to # [phone] and the call was dropped after 8 rings, no option for leaving a message. Call also placed to the patient's sister, Landon Bassford # [phone] and a HIPAA compliant voicemail message was left requesting a call back to # [phone] or [phone]  Call Completed: No

## 2016-06-22 ENCOUNTER — Telehealth: Payer: Self-pay

## 2016-06-22 NOTE — Telephone Encounter (Signed)
Attempted to call.  Phone number has been disconnected.

## 2016-06-22 NOTE — Telephone Encounter (Signed)
[facility] Post-discharge Follow-Up Phone Call:  Date of Discharge: 06/17/2016 Principal Discharge Diagnosis(es): acute on chronic diastolic heart failure  Post-discharge Communication: (Clearly document all attempts clearly and date contact made)  Call placed to # [phone] and the call was dropped after 7 rings. Call placed to the patient's sister, Khayden Herzberg # [phone] and a HIPAA compliant voicemail message was left requesting a call back to # [phone] or [phone].  Call Completed: No

## 2016-06-24 ENCOUNTER — Encounter: Payer: Self-pay | Admitting: Internal Medicine

## 2016-06-24 ENCOUNTER — Other Ambulatory Visit (HOSPITAL_COMMUNITY): Payer: Self-pay | Admitting: Internal Medicine

## 2016-06-24 ENCOUNTER — Telehealth: Payer: Self-pay

## 2016-06-24 NOTE — Telephone Encounter (Signed)
[facility] Post-discharge Follow-Up Phone Call:  Date of Discharge: 06/17/16 Principal Discharge Diagnosis(es): acute on chronic diastolic heart failure, chronic kidney disease stage III, diabetes mellitus type 2, CAD Call Completed: Yes                    With Whom: Patient    Please check all that apply:  X  Patient is knowledgeable of his/her condition(s) and/or treatment. X  Patient is caring for self at home. Patient refused home health services prior to discharge. ? Patient is receiving assist at home from family and/or caregiver. Family and/or caregiver is knowledgeable of patient's condition(s) and/or treatment. ? Patient is receiving home health services. If so, name of agency.     Medication Reconciliation:  X Medication list reviewed with patient. X  Patient obtained all discharge medications-YES. Patient's medication list thoroughly reviewed. Patient has all medications and indicated he has been taking them as instructed.  Patient also instructed that per discharge summary he is to STOP taking the following medications: furosemide 80 mg tablet, toujeo solostar 300units/mL, lisinopril 10 mg tablet. Patient verbalized understanding. Instructed patient to bring all medications to upcoming appointment on 06/25/16.  Patient verbalized understanding. Inquired if patient checking blood glucose as instructed, and patient indicated he was. Inquired about patient's blood glucose readings, and patient unable to recall blood glucose readings. He just indicated his blood glucose has been "better than it was in the hospital." Inquired if patient keeping record of blood glucose readings. Patient indicated he was not keeping a log but readings are stored in his glucometer. Instructed patient to bring glucometer to his appointment on 06/25/16, and patient verbalized understanding.   Activities of Daily Living:  X  Independent-has home oxygen that he uses PRN ? Needs assist  ? [facility]   Community resources in place for patient:  X  None  ? Home Health/Home DME ? Assisted Living ? Support Group         Questions/Concerns discussed: This Case Manager placed call to patient to complete discharge follow-up phone call. Inquired about patient's status. Patient denied shortness of breath or health concerns. Informed him that [facility] appointment needed to be scheduled as patient cancelled appointment on 06/25/16 at 1000. Appointment rescheduled for 06/25/16 at 1400 with Dr. Venetia Night. Patient indicated he would have transportation to his appointment. No additional needs identified.

## 2016-06-25 ENCOUNTER — Inpatient Hospital Stay: Payer: Medicare Other | Admitting: Family Medicine

## 2016-06-25 ENCOUNTER — Ambulatory Visit: Payer: Medicare Other | Attending: Family Medicine | Admitting: Family Medicine

## 2016-06-25 ENCOUNTER — Encounter: Payer: Self-pay | Admitting: Family Medicine

## 2016-06-25 VITALS — BP 117/77 | HR 101 | Temp 97.4°F | Ht 72.0 in | Wt 377.6 lb

## 2016-06-25 DIAGNOSIS — E781 Pure hyperglyceridemia: Secondary | ICD-10-CM | POA: Insufficient documentation

## 2016-06-25 DIAGNOSIS — Z9889 Other specified postprocedural states: Secondary | ICD-10-CM | POA: Diagnosis not present

## 2016-06-25 DIAGNOSIS — E118 Type 2 diabetes mellitus with unspecified complications: Secondary | ICD-10-CM | POA: Diagnosis not present

## 2016-06-25 DIAGNOSIS — I4891 Unspecified atrial fibrillation: Secondary | ICD-10-CM

## 2016-06-25 DIAGNOSIS — M7989 Other specified soft tissue disorders: Secondary | ICD-10-CM | POA: Diagnosis not present

## 2016-06-25 DIAGNOSIS — E7800 Pure hypercholesterolemia, unspecified: Secondary | ICD-10-CM | POA: Diagnosis not present

## 2016-06-25 DIAGNOSIS — Z794 Long term (current) use of insulin: Secondary | ICD-10-CM | POA: Diagnosis not present

## 2016-06-25 DIAGNOSIS — I5032 Chronic diastolic (congestive) heart failure: Secondary | ICD-10-CM

## 2016-06-25 DIAGNOSIS — I878 Other specified disorders of veins: Secondary | ICD-10-CM | POA: Diagnosis not present

## 2016-06-25 DIAGNOSIS — Z79899 Other long term (current) drug therapy: Secondary | ICD-10-CM | POA: Diagnosis not present

## 2016-06-25 DIAGNOSIS — G4733 Obstructive sleep apnea (adult) (pediatric): Secondary | ICD-10-CM | POA: Diagnosis not present

## 2016-06-25 DIAGNOSIS — I2510 Atherosclerotic heart disease of native coronary artery without angina pectoris: Secondary | ICD-10-CM | POA: Insufficient documentation

## 2016-06-25 DIAGNOSIS — I130 Hypertensive heart and chronic kidney disease with heart failure and stage 1 through stage 4 chronic kidney disease, or unspecified chronic kidney disease: Secondary | ICD-10-CM | POA: Diagnosis not present

## 2016-06-25 DIAGNOSIS — N183 Chronic kidney disease, stage 3 unspecified: Secondary | ICD-10-CM

## 2016-06-25 DIAGNOSIS — I872 Venous insufficiency (chronic) (peripheral): Secondary | ICD-10-CM

## 2016-06-25 DIAGNOSIS — I480 Paroxysmal atrial fibrillation: Secondary | ICD-10-CM | POA: Insufficient documentation

## 2016-06-25 DIAGNOSIS — E1165 Type 2 diabetes mellitus with hyperglycemia: Secondary | ICD-10-CM

## 2016-06-25 DIAGNOSIS — Z7982 Long term (current) use of aspirin: Secondary | ICD-10-CM | POA: Diagnosis not present

## 2016-06-25 DIAGNOSIS — I5033 Acute on chronic diastolic (congestive) heart failure: Secondary | ICD-10-CM | POA: Diagnosis not present

## 2016-06-25 DIAGNOSIS — Z7901 Long term (current) use of anticoagulants: Secondary | ICD-10-CM | POA: Insufficient documentation

## 2016-06-25 DIAGNOSIS — E1122 Type 2 diabetes mellitus with diabetic chronic kidney disease: Secondary | ICD-10-CM | POA: Insufficient documentation

## 2016-06-25 DIAGNOSIS — IMO0002 Reserved for concepts with insufficient information to code with codable children: Secondary | ICD-10-CM

## 2016-06-25 LAB — GLUCOSE, POCT (MANUAL RESULT ENTRY)
POC GLUCOSE: 478 mg/dL — AB (ref 70–99)
POC Glucose: 442 mg/dl — AB (ref 70–99)

## 2016-06-25 MED ORDER — INSULIN ASPART 100 UNIT/ML ~~LOC~~ SOLN
15.0000 [IU] | Freq: Once | SUBCUTANEOUS | Status: AC
  Administered 2016-06-25: 15 [IU] via SUBCUTANEOUS

## 2016-06-25 NOTE — Progress Notes (Signed)
Transitional care clinic  Date of telephone encounter: 06/18/16  Hospitalization dates: 06/09/16 through 06/17/16  PCP: Dr Janne Napoleon Subjective:  Patient ID: [name], male    DOB: 14-Dec-1950  Age: 65 y.o. MRN: [id number]  CC: Hospitalization Follow-up; Congestive Heart Failure; and Diabetes   HPI [name] is a 65 year old male with a history of morbid obesity, type 2 diabetes mellitus (A1c 8.1 from 05/2016 he), hypertension,OSA (unable to tolerate CPAP), intermittent oxygen use, CAD (status post PCI in 2012), chronic diastolic heart failure (EF 60-65% from 2-D echo 06/2016), atrial fibrillation (s/p cardioversion in the past) who presents today for a follow-up from hospitalization.  He was admitted for acute on chronic diastolic heart failure and was noticed to have significant weight gain, worsening dyspnea and lower extremity edema with lower extremity weeping. Was diuresed with IV Lasix with resulting weight loss of 19 pounds; this was later switched to torsemide on which he was discharged. Spironolactone was held due to renal function and the plan was to resume after discharge once renal function stabilized.[name]  2-D echo (from 06/2016): EF 60-65%, no regional wall motion abnormalities 2-D echo (from 11/2014): EF 50-55%, no regional wall motion abnormalities LHC &RHC (from 01/2014):Stenosis of 40-50% mid LAD, 50% circumflex and 30% RCA lesions; LVEF 65%   Today he informs me he never stopped taking his spironolactone. He does have some shortness of breath which is at baseline and some orthopnea. Noticed that he has had mild leg swelling. Has not been checking his weight at home due to being unstable on the scale.  His blood sugar is elevated at 478 and he endorses forgetting to take his Lantus because he woke up late. Diabetes mellitus is managed by endocrine- Dr Vicenta Aly. He has all his medications; states he does not need home care services and is coping well.  Past Medical  History:  Diagnosis Date  . Arthritis    "hands and lower back" (09/19/2014)  . CAD (coronary artery disease)    a. s/p PCI to RCA in 2012. b. prior cath in 01/2014 with elevated L/RH pressures, mild-mod CAD of LAD/LCx with patent RCA, normal EF, c/b CIN/CHF.  [name] Carpal tunnel syndrome, bilateral   . Cellulitis   . Chronic diastolic CHF (congestive heart failure) ([place])   . Chronic kidney disease (CKD), stage III (moderate)   . Chronic lower back pain   . History of blood transfusion ~ 1954   "related to OR"  . Hyperlipidemia   . Hypertension   . Hypoxia    a. Qualified for home O2 at [place] in 09/2014.  [name] Lower GI bleed   . Microcytic anemia   . Morbid obesity ([place])   . Neuropathy ([place])   . OSA (obstructive sleep apnea)    "I wear nasal prongs; haven't been using prongs recently" (09/19/2014)  . PAF (paroxysmal atrial fibrillation) ([place])    TEE DCCV 09/23/2014  . Physical deconditioning   . Pilonidal cyst 1980's; 01/25/2013  . Scrotal abscess   . Type II diabetes mellitus ([place])     Past Surgical History:  Procedure Laterality Date  . ABDOMINAL SURGERY  ~ 1954   BENIGN TUMOR REMOVED  . APPENDECTOMY    . CARDIOVERSION  2010   Archie Endo 09/19/2014  . CARDIOVERSION N/A 09/23/2014   Procedure: CARDIOVERSION;  Surgeon: Lelon Perla, MD;  Location: [facility] ENDOSCOPY;  Service: Cardiovascular;  Laterality: N/A;  . CARDIOVERSION N/A 12/11/2014   Procedure: CARDIOVERSION;  Surgeon: Sanda Klein, MD;  Location: [place] ENDOSCOPY;  Service: Cardiovascular;  Laterality: N/A;  . CATARACT EXTRACTION W/PHACO Right 11/15/2012   Procedure: CATARACT EXTRACTION PHACO AND INTRAOCULAR LENS PLACEMENT (IOC);  Surgeon: Adonis Brook, MD;  Location: [place];  Service: Ophthalmology;  Laterality: Right;  . CATARACT EXTRACTION W/PHACO Left 11/29/2012   Procedure: CATARACT EXTRACTION PHACO AND INTRAOCULAR LENS PLACEMENT (IOC);  Surgeon: Adonis Brook, MD;  Location: [place];  Service: Ophthalmology;  Laterality: Left;  . CORONARY  ANGIOPLASTY WITH STENT PLACEMENT  August 2012   RCA DES - [facility]  . DEBRIDEMENT  FOOT Left    debriding diabetic foot ulcers  . EYE SURGERY    . FOREIGN BODY REMOVAL Right 2014   heel,  splinter removal   . LEFT AND RIGHT HEART CATHETERIZATION WITH CORONARY ANGIOGRAM N/A 01/31/2014   Procedure: LEFT AND RIGHT HEART CATHETERIZATION WITH CORONARY ANGIOGRAM;  Surgeon: Blane Ohara, MD;  Location: [facility] CATH LAB;  Service: Cardiovascular;  Laterality: N/A;  . PARS PLANA VITRECTOMY Left 06/05/2013   Procedure: PARS PLANA VITRECTOMY WITH 23 GAUGE with Endolaser(constellation);  Surgeon: Adonis Brook, MD;  Location: [place];  Service: Ophthalmology;  Laterality: Left;  . PILONIDAL CYST EXCISION N/A 01/08/2013   Procedure: CYST EXCISION PILONIDAL EXTENSIVE;  Surgeon: Ralene Ok, MD;  Location: [place];  Service: General;  Laterality: N/A;  . PILONIDAL CYST EXCISION  1980's   "in [place]"  . TEE WITHOUT CARDIOVERSION N/A 09/23/2014   Procedure: TRANSESOPHAGEAL ECHOCARDIOGRAM (TEE);  Surgeon: Lelon Perla, MD;  Location: [facility] ENDOSCOPY;  Service: Cardiovascular;  Laterality: N/A;  . TONSILLECTOMY      No Known Allergies   Outpatient Medications Prior to Visit  Medication Sig Dispense Refill  . aspirin EC 81 MG EC tablet Take 1 tablet (81 mg total) by mouth daily. 30 tablet 2  . atorvastatin (LIPITOR) 80 MG tablet Take 1 tablet (80 mg total) by mouth daily. 30 tablet 10  . BD INSULIN SYRINGE ULTRAFINE 31G X 15/64" 0.3 ML MISC USE TO IJNECT INSULIN 3 TIMES DAILY.  5  . Blood Glucose Monitoring Suppl (ACCU-CHEK AVIVA PLUS) w/Device KIT Use to check blood sugar 6 times per day dx code E11.65 1 kit 0  . doxycycline (VIBRA-TABS) 100 MG tablet Take 1 tablet (100 mg total) by mouth 2 (two) times daily. 20 tablet 0  . gabapentin (NEURONTIN) 300 MG capsule Take 2 capsules (600 mg total) by mouth 2 (two) times daily. 60 capsule 3  . HUMALOG KWIKPEN 200 UNIT/ML SOPN INJECT  80 TO 100 UNITS 3 TIMES A DAY DEPENDING ON MEAL SIZE 48 pen 0  . Hypromellose (NATURAL BALANCE TEARS OP) Place 1 drop into both eyes daily as needed (for dry eyes).     . insulin glargine (LANTUS) 100 UNIT/ML injection Inject 0.5 mLs (50 Units total) into the skin 2 (two) times daily. 10 mL 11  . metoprolol (LOPRESSOR) 50 MG tablet Take 50 mg by mouth 2 (two) times daily. TWO TABLETS TWICE A DAY    . Multiple Vitamin (MULTIVITAMIN WITH MINERALS) TABS Take 1 tablet by mouth daily.    [name] oxyCODONE 20 MG TABS Take 1 tablet (20 mg total) by mouth every 6 (six) hours as needed for severe pain. (Patient taking differently: Take 20-30 mg by mouth every 6 (six) hours as needed for severe pain. ) 30 tablet 0  . potassium chloride SA (K-DUR,KLOR-CON) 10 MEQ tablet Take 3 tablets (30 mEq total) by mouth 2 (two) times daily. 200 tablet  1  . rivaroxaban (XARELTO) 20 MG TABS tablet Take 1 tablet (20 mg total) by mouth daily with supper. 30 tablet 3  . spironolactone (ALDACTONE) 25 MG tablet Take 0.5 tablets (12.5 mg total) by mouth daily. 90 tablet 3  . torsemide (DEMADEX) 20 MG tablet Take 3 tablets (60 mg total) by mouth 2 (two) times daily. 60 tablet 2  . hydrocerin (EUCERIN) CREA Apply 1 application topically 3 (three) times daily. legs (Patient not taking: Reported on 06/25/2016) 454 g 3  . mupirocin cream (BACTROBAN) 2 % Apply topically 2 (two) times daily. (Patient not taking: Reported on 06/25/2016) 15 g 0   No facility-administered medications prior to visit.     ROS Review of Systems  Constitutional: Negative for activity change and appetite change.  HENT: Negative for sinus pressure and sore throat.   Eyes: Negative for visual disturbance.  Respiratory: Positive for shortness of breath. Negative for cough and chest tightness.   Cardiovascular: Positive for leg swelling. Negative for chest pain.  Gastrointestinal: Negative for abdominal distention, abdominal pain, constipation and diarrhea.    Endocrine: Negative.   Genitourinary: Negative for dysuria.  Musculoskeletal: Negative for joint swelling and myalgias.  Skin: Negative for rash.  Allergic/Immunologic: Negative.   Neurological: Negative for weakness, light-headedness and numbness.  Psychiatric/Behavioral: Negative for dysphoric mood and suicidal ideas.    Objective:  BP 117/77 (BP Location: Right Arm, Patient Position: Sitting, Cuff Size: Large)   Pulse (!) 101   Temp 97.4 F (36.3 C) (Oral)   Ht 6' (1.829 m)   Wt (!) 377 lb 9.6 oz (171.3 kg)   SpO2 93%   BMI 51.21 kg/m   BP/Weight 06/25/2016 06/17/2016 33/82/5053  Systolic BP 976 734 -  Diastolic BP 77 67 -  Wt. (Lbs) 377.6 362.7 -  BMI 51.21 - 49.19      Physical Exam  Constitutional: He is oriented to person, place, and time. He appears well-developed and well-nourished.  Morbidly obese  Neck: No JVD present.  Cardiovascular: Normal rate, normal heart sounds and intact distal pulses.  An irregularly irregular rhythm present.  No murmur heard. Pulmonary/Chest: Effort normal and breath sounds normal. He has no wheezes. He has no rales. He exhibits no tenderness.  Abdominal: Soft. Bowel sounds are normal. He exhibits no distension and no mass. There is no tenderness.  Musculoskeletal: Normal range of motion. He exhibits edema (1+ bilateral pitting pedal edema).  Neurological: He is alert and oriented to person, place, and time.  Skin: There is erythema (erythema of bilateral lower extremities; rash in keeping with stasis dermatitis).  Psychiatric: He has a normal mood and affect.     Lab Results  Component Value Date   HGBA1C 8.1 (H) 06/10/2016    Lipid Panel     Component Value Date/Time   CHOL 127 03/03/2016 1431   TRIG 170.0 (H) 03/03/2016 1431   HDL 28.10 (L) 03/03/2016 1431   CHOLHDL 5 03/03/2016 1431   VLDL 34.0 03/03/2016 1431   LDLCALC 65 03/03/2016 1431   LDLDIRECT 82.1 08/31/2011 0954     Assessment & Plan:   1. Uncontrolled  type 2 diabetes mellitus with complication, without long-term current use of insulin (HCC) Uncontrolled with A1c of 8.1 NovoLog 15 units administered due to CBG of 478 in the clinic Followed by endocrine - Dr Vicenta Aly - Glucose (CBG) - insulin aspart (novoLOG) injection 15 Units; Inject 0.15 mLs (15 Units total) into the skin once.  2. Pure hypercholesterolemia Mild hypertriglyceridemia Continue  statin  3. CKD (chronic kidney disease) stage 3, GFR 30-59 ml/min Spironolactone was held at discharge however the patient kept taking it We'll check renal function and potassium Avoid nephrotoxins - COMPLETE METABOLIC PANEL WITH GFR  4. Chronic diastolic congestive heart failure (HCC) EF 60-65% from 2-D echo of 06/2016 He has gained 15 pounds since discharge Advised to increase torsemide to 80 mg twice daily until appointment with cardiology in 3 days Patient also to weigh himself at home Admit fluid intake is less than 2-lead [place] per day, low-sodium diet  5. Atrial fibrillation with RVR (HCC) Continue anticoagulation with Xarelto and rate control with metoprolol  6. Venous stasis dermatitis of both lower extremities Improving Advised to elevate feet and avoid dependent position as much as possible   Meds ordered this encounter  Medications  . insulin aspart (novoLOG) injection 15 Units    Follow-up: Return in about 2 weeks (around 07/09/2016) for TCC - follow up on CHF.   Arnoldo Morale MD

## 2016-06-26 LAB — COMPLETE METABOLIC PANEL WITH GFR
ALT: 20 U/L (ref 9–46)
AST: 23 U/L (ref 10–35)
Albumin: 3.3 g/dL — ABNORMAL LOW (ref 3.6–5.1)
Alkaline Phosphatase: 81 U/L (ref 40–115)
BUN: 23 mg/dL (ref 7–25)
CHLORIDE: 94 mmol/L — AB (ref 98–110)
CO2: 26 mmol/L (ref 20–31)
Calcium: 8.5 mg/dL — ABNORMAL LOW (ref 8.6–10.3)
Creat: 1.48 mg/dL — ABNORMAL HIGH (ref 0.70–1.25)
GFR, EST NON AFRICAN AMERICAN: 49 mL/min — AB (ref >=60)
GFR, Est African American: 57 mL/min — ABNORMAL LOW (ref >=60)
GLUCOSE: 449 mg/dL — AB (ref 65–99)
POTASSIUM: 4 mmol/L (ref 3.5–5.3)
SODIUM: 134 mmol/L — AB (ref 135–146)
TOTAL PROTEIN: 6.8 g/dL (ref 6.1–8.1)
Total Bilirubin: 0.5 mg/dL (ref 0.2–1.2)

## 2016-06-28 ENCOUNTER — Encounter: Payer: Self-pay | Admitting: Physician Assistant

## 2016-06-28 ENCOUNTER — Ambulatory Visit (INDEPENDENT_AMBULATORY_CARE_PROVIDER_SITE_OTHER): Payer: Medicare Other | Admitting: Physician Assistant

## 2016-06-28 VITALS — BP 159/68 | HR 79 | Ht 72.0 in | Wt 380.0 lb

## 2016-06-28 DIAGNOSIS — I5033 Acute on chronic diastolic (congestive) heart failure: Secondary | ICD-10-CM | POA: Diagnosis not present

## 2016-06-28 DIAGNOSIS — I10 Essential (primary) hypertension: Secondary | ICD-10-CM

## 2016-06-28 DIAGNOSIS — N183 Chronic kidney disease, stage 3 unspecified: Secondary | ICD-10-CM

## 2016-06-28 DIAGNOSIS — I482 Chronic atrial fibrillation, unspecified: Secondary | ICD-10-CM

## 2016-06-28 DIAGNOSIS — Z794 Long term (current) use of insulin: Secondary | ICD-10-CM

## 2016-06-28 DIAGNOSIS — E785 Hyperlipidemia, unspecified: Secondary | ICD-10-CM

## 2016-06-28 DIAGNOSIS — I2510 Atherosclerotic heart disease of native coronary artery without angina pectoris: Secondary | ICD-10-CM | POA: Diagnosis not present

## 2016-06-28 DIAGNOSIS — E118 Type 2 diabetes mellitus with unspecified complications: Secondary | ICD-10-CM

## 2016-06-28 MED ORDER — METOLAZONE 5 MG PO TABS
5.0000 mg | ORAL_TABLET | ORAL | 11 refills | Status: DC
Start: 2016-06-28 — End: 2016-09-06

## 2016-06-28 MED ORDER — TORSEMIDE 20 MG PO TABS: 80.0000 mg | ORAL_TABLET | Freq: Two times a day (BID) | ORAL | 11 refills | Status: DC

## 2016-06-28 NOTE — Progress Notes (Signed)
Cardiology Office Note    Date:  06/28/2016   ID:  [name], DOB 09-13-1950, MRN [id number]  PCP:  Paul Reamer, MD  Cardiologist:  Dr. Stanford Breed   Chief Complaint  Patient presents with  . Follow-up    1 week  . Edema     in ankles and feet  . Shortness of Breath    when walking long distance.    History of Present Illness:  [name] is a 65 y.o. male with PMH of morbid obesity, CAD, chronic diastolic heart failure, stage III CKD, HTN, HLD, DM II, chronic atrial fibrillation and OSA. He had PCI to his RCA in 2012, repeat cath is July 2015 showed 40-50% mid LAD, 50% circumflex and 30% RCA. Ejection fraction 65%. He did develop contrast nephropathy. He has chronic atrial fibrillation and felt to hold sinus rhythm after TEE DCCV in March 2016. Dr. Percival Spanish stopped his amiodarone he December 2016, however he was still on amiodarone by the time he followed up in August 2017. He had problem manage PT/INR with Coumadin due to transportation issues and later was switched to Xarelto. He was admitted in March 2017 for sepsis and a scrotal abscess. He was admitted again in June after a fall at home. He was down for roughly 10 hours and had acute kidney injury and rhabdomyolysis. He had a syncope with micturition in August. His Aldactone was decreased to 12.5 mg and Lasix was cut back to 80 mg BID after holding it for 2 doses. He was instructed to resume Lasix at 80 mg BID and did take metolazone 5 mg when necessary if his weight is above 375 pounds.  He was most recently admitted on 06/09/2016 for increased leg swelling. He also gained roughly 20 pounds as well. Echocardiogram obtained on 06/11/2016 showed EF 60-65%, no RWMA. He responded well to diuresis, he was eventually discharged on torsemide 60 mg BID. His discharge weight was 362 pounds. He is spironolactone was held due to renal function and the plan was to resume after discharge. He was most recently seen by his PCP on  06/25/2016, it turned out he never stopped his spironolactone after discharge. He also noted increased swelling as well. His weight on follow-up was 377 pounds. His blood sugar was also uncontrolled in the 400 range. He presents today for cardiology office visit, his weight today is 280 pounds. He started noticing her right leg become weeping in the last few days. At [facility] recommendation, he has been taking 80 mg twice a day of torsemide since last Thursday without noticeable increase in the urine output. He is concerned about his renal issues. It is still quite surprising how fast he is reaccumulating the fluid. Fortunately his lung is clear at this time, I plan to reinitiate metolazone which was discontinued in October due to renal concerns.    Past Medical History:  Diagnosis Date  . Arthritis    "hands and lower back" (09/19/2014)  . CAD (coronary artery disease)    a. s/p PCI to RCA in 2012. b. prior cath in 01/2014 with elevated L/RH pressures, mild-mod CAD of LAD/LCx with patent RCA, normal EF, c/b CIN/CHF.  [name] Carpal tunnel syndrome, bilateral   . Cellulitis   . Chronic diastolic CHF (congestive heart failure) ([place])   . Chronic kidney disease (CKD), stage III (moderate)   . Chronic lower back pain   . History of blood transfusion ~ 1954   "related to OR"  .  Hyperlipidemia   . Hypertension   . Hypoxia    a. Qualified for home O2 at [place] in 09/2014.  [name] Lower GI bleed   . Microcytic anemia   . Morbid obesity ([employer])   . Neuropathy ([place])   . OSA (obstructive sleep apnea)    "I wear nasal prongs; haven't been using prongs recently" (09/19/2014)  . PAF (paroxysmal atrial fibrillation) ([place])    TEE DCCV 09/23/2014  . Physical deconditioning   . Pilonidal cyst 1980's; 01/25/2013  . Scrotal abscess   . Type II diabetes mellitus ([place])     Past Surgical History:  Procedure Laterality Date  . ABDOMINAL SURGERY  ~ 1954   BENIGN TUMOR REMOVED  . APPENDECTOMY    . CARDIOVERSION  2010   Archie Endo  09/19/2014  . CARDIOVERSION N/A 09/23/2014   Procedure: CARDIOVERSION;  Surgeon: Lelon Perla, MD;  Location[facility] ENDOSCOPY;  Service: Cardiovascular;  Laterality: N/A;  . CARDIOVERSION N/A 12/11/2014   Procedure: CARDIOVERSION;  Surgeon: Sanda Klein, MD;  Location: [place] ENDOSCOPY;  Service: Cardiovascular;  Laterality: N/A;  . CATARACT EXTRACTION W/PHACO Right 11/15/2012   Procedure: CATARACT EXTRACTION PHACO AND INTRAOCULAR LENS PLACEMENT ([place]);  Surgeon: Adonis Brook, MD;  Location: [place];  Service: Ophthalmology;  Laterality: Right;  . CATARACT EXTRACTION W/PHACO Left 11/29/2012   Procedure: CATARACT EXTRACTION PHACO AND INTRAOCULAR LENS PLACEMENT (I[facility]);  Surgeon: Adonis Brook, MD;  Location: [place];  Service: Ophthalmology;  Laterality: Left;  . CORONARY ANGIOPLASTY WITH STENT PLACEMENT  August 2012   RCA DES - [facility]  . DEBRIDEMENT  FOOT Left    debriding diabetic foot ulcers  . EYE SURGERY    . FOREIGN BODY REMOVAL Right 2014   heel,  splinter removal   . LEFT AND RIGHT HEART CATHETERIZATION WITH CORONARY ANGIOGRAM N/A 01/31/2014   Procedure: LEFT AND RIGHT HEART CATHETERIZATION WITH CORONARY ANGIOGRAM;  Surgeon: Blane Ohara, MD;  Location: [facility] CATH LAB;  Service: Cardiovascular;  Laterality: N/A;  . PARS PLANA VITRECTOMY Left 06/05/2013   Procedure: PARS PLANA VITRECTOMY WITH 23 GAUGE with Endolaser(constellation);  Surgeon: Adonis Brook, MD;  Location: [place];  Service: Ophthalmology;  Laterality: Left;  . PILONIDAL CYST EXCISION N/A 01/08/2013   Procedure: CYST EXCISION PILONIDAL EXTENSIVE;  Surgeon: Ralene Ok, MD;  Location: [place];  Service: General;  Laterality: N/A;  . PILONIDAL CYST EXCISION  1980's   "in [place]"  . TEE WITHOUT CARDIOVERSION N/A 09/23/2014   Procedure: TRANSESOPHAGEAL ECHOCARDIOGRAM (TEE);  Surgeon: Lelon Perla, MD;  Location: [facility] ENDOSCOPY;  Service: Cardiovascular;  Laterality: N/A;  . TONSILLECTOMY      Current  Medications: Outpatient Medications Prior to Visit  Medication Sig Dispense Refill  . aspirin EC 81 MG EC tablet Take 1 tablet (81 mg total) by mouth daily. 30 tablet 2  . atorvastatin (LIPITOR) 80 MG tablet Take 1 tablet (80 mg total) by mouth daily. 30 tablet 10  . BD INSULIN SYRINGE ULTRAFINE 31G X 15/64" 0.3 ML MISC USE TO IJNECT INSULIN 3 TIMES DAILY.  5  . Blood Glucose Monitoring Suppl (ACCU-CHEK AVIVA PLUS) w/Device KIT Use to check blood sugar 6 times per day dx code E11.65 1 kit 0  . gabapentin (NEURONTIN) 300 MG capsule Take 2 capsules (600 mg total) by mouth 2 (two) times daily. 60 capsule 3  . HUMALOG KWIKPEN 200 UNIT/ML SOPN INJECT 80 TO 100 UNITS 3 TIMES A DAY DEPENDING ON MEAL SIZE 48 pen 0  .  hydrocerin (EUCERIN) CREA Apply 1 application topically 3 (three) times daily. legs 454 g 3  . Hypromellose (NATURAL BALANCE TEARS OP) Place 1 drop into both eyes daily as needed (for dry eyes).     . insulin glargine (LANTUS) 100 UNIT/ML injection Inject 0.5 mLs (50 Units total) into the skin 2 (two) times daily. 10 mL 11  . metoprolol (LOPRESSOR) 50 MG tablet Take 50 mg by mouth 2 (two) times daily. TWO TABLETS TWICE A DAY    . Multiple Vitamin (MULTIVITAMIN WITH MINERALS) TABS Take 1 tablet by mouth daily.    . mupirocin cream (BACTROBAN) 2 % Apply topically 2 (two) times daily. 15 g 0  . oxyCODONE 20 MG TABS Take 1 tablet (20 mg total) by mouth every 6 (six) hours as needed for severe pain. (Patient taking differently: Take 20-30 mg by mouth every 6 (six) hours as needed for severe pain. ) 30 tablet 0  . potassium chloride SA (K-DUR,KLOR-CON) 10 MEQ tablet Take 3 tablets (30 mEq total) by mouth 2 (two) times daily. 200 tablet 1  . rivaroxaban (XARELTO) 20 MG TABS tablet Take 1 tablet (20 mg total) by mouth daily with supper. 30 tablet 3  . spironolactone (ALDACTONE) 25 MG tablet Take 0.5 tablets (12.5 mg total) by mouth daily. 90 tablet 3  . torsemide (DEMADEX) 20 MG tablet Take 3 tablets  (60 mg total) by mouth 2 (two) times daily. 60 tablet 2   No facility-administered medications prior to visit.      Allergies:   Patient has no known allergies.   Social History   Social History  . Marital status: Single    Spouse name: N/A  . Number of children: Y  . Years of education: N/A   Occupational History  . disabled truck driver    Social History Main Topics  . Smoking status: Former Smoker    Packs/day: 1.00    Years: 20.00    Types: Cigarettes    Quit date: 07/13/1983  . Smokeless tobacco: Never Used  . Alcohol use No  . Drug use: No  . Sexual activity: Not Currently   Other Topics Concern  . None   Social History Narrative   Pt is single.   Lives with sister.   Has children.   Was adopted.   Daily cafffiene-2 cups of coffee and 2 sodas per day           Family History:  The patient's family history includes Other in his other. He was adopted.   ROS:   Please see the history of present illness.    ROS All other systems reviewed and are negative.   PHYSICAL EXAM:   VS:  BP (!) 159/68   Pulse 79   Ht 6' (1.829 m)   Wt (!) 380 lb (172.4 kg)   BMI 51.54 kg/m    GEN: Well nourished, well developed, in no acute distress  HEENT: normal  Neck: no JVD, carotid bruits, or masses Cardiac: irregularly irregular; no murmurs, rubs, or gallops. BLE edema with scale, RLE weeping drainage Respiratory:  clear to auscultation bilaterally, normal work of breathing GI: soft, nontender, nondistended, + BS MS: no deformity or atrophy  Skin: warm and dry, no rash Neuro:  Alert and Oriented x 3, Strength and sensation are intact Psych: euthymic mood, full affect  Wt Readings from Last 3 Encounters:  06/28/16 (!) 380 lb (172.4 kg)  06/25/16 (!) 377 lb 9.6 oz (171.3 kg)  06/17/16 (!) 362  lb 11.2 oz (164.5 kg)      Studies/Labs Reviewed:   EKG:  EKG is not ordered today.   Recent Labs: 06/14/2016: B Natriuretic Peptide 101.8; TSH 0.687 06/17/2016:  Hemoglobin 10.9; Platelets 374 06/25/2016: ALT 20; BUN 23; Creat 1.48; Potassium 4.0; Sodium 134   Lipid Panel    Component Value Date/Time   CHOL 127 03/03/2016 1431   TRIG 170.0 (H) 03/03/2016 1431   HDL 28.10 (L) 03/03/2016 1431   CHOLHDL 5 03/03/2016 1431   VLDL 34.0 03/03/2016 1431   LDLCALC 65 03/03/2016 1431   LDLDIRECT 82.1 08/31/2011 0954    Additional studies/ records that were reviewed today include:   Cath 01/31/2014 Left mainstem: The left main arises from the left coronary cusp. The vessel is patent and it divides into the left circumflex, LAD, and intermediate branch.  Left anterior descending (LAD): The LAD is patent to the apex. The vessel has diffuse stenosis in its midportion of approximately 40-50%. There is no high-grade disease. The diagonal branches are patent.  Left circumflex (LCx): The left circumflex is patent with a large obtuse marginal branch. The intermediate branch divides into twin vessels and it is also widely patent. The mid AV circumflex just beyond the OM branch has 50% stenosis. The PLA branch is patent.  Right coronary artery (RCA): The RCA is dominant. The stented segment in the proximal vessel is widely patent. The mid vessel has 30% stenosis his PDA and PLA branches are patent.  Left ventriculography: Left ventricular systolic function is vigorous, LVEF is estimated at 65%, there is no significant mitral regurgitation   Final Conclusions:   1. Elevated right and left heart filling pressures 2. Mild to moderate nonobstructive coronary artery disease of the LAD and left circumflex with a widely patent RCA 3. Normal LV systolic function    Echo 06/11/2016 LV EF: 60% -   65%  - Left ventricle: The cavity size was normal. There was moderate   concentric hypertrophy. Systolic function was normal. The   estimated ejection fraction was in the range of 60% to 65%. Wall   motion was normal; there were no regional wall motion    abnormalities. - Aortic valve: Trileaflet; moderately thickened, moderately   calcified leaflets. - Mitral valve: Calcified annulus. There was trivial regurgitation. - Left atrium: The atrium was moderately dilated. Volume/bsa, ES   (1-plane Simpson&'s, A4C): 47.5 ml/m^2.  Impressions:  - Compared to the prior study, there has been no significant   interval change.   ASSESSMENT:    1. Acute on chronic diastolic congestive heart failure ([place])   2. Morbid obesity ([place])   3. CKD (chronic kidney disease), stage III   4. Coronary artery disease involving native coronary artery of native heart without angina pectoris   5. Essential hypertension   6. Hyperlipidemia, unspecified hyperlipidemia type   7. Chronic atrial fibrillation (HCC)   8. Controlled type 2 diabetes mellitus with complication, with long-term current use of insulin (HCC)      PLAN:  In order of problems listed above:  1. Acute on chronic diastolic heart failure: He has regained roughly 18 pounds since he left the hospital in a course of only 2 weeks. Unfortunately, this will likely recur again and again. At Dr. Johna Sheriff recommendation, he has increased his torsemide to 80 mg twice a day. He has not noticed significant urinary output in the last few days, his weight is still going up. It seems he is still on metolazone when he  saw Lurena Joiner on 03/11/2016 with instruction of metolazone as needed if his weight is above 375. Metolazone is no longer on his medication list, he said this was stopped due to his renal issues. I tried to identify when the metolazone was discontinued, his symptoms by the time he was seen by Dr. Dwyane Dee on 04/20/2016, he was artery off metolazone. I think he need to metolazone at this point given recurrence of volume overload especially given the degree and that the speed of fluid accumulation. I will place him on 5 mg metolazone every Monday Wednesday Friday. Furthermore, I think he would benefit with further  medication titration through our heart failure clinic. I have discussed with him regarding salt restriction, fluid restriction to less than 2 L per day, and also weight monitor every morning  2. Morbid obesity: Ultimately, his issue is morbid obesity. I did discuss with the patient that unless he loses weight, his symptom may recur again.  3. CKD stage III: Creatinine stable at 1.48 last Friday. I will obtain a basic metabolic panel this Friday for repeat given adjustment of diuretic.  4. CAD: No obstructive CAD 2015, no angina recently. Echocardiogram obtained on 06/11/2016 showed normal EF.  5. HTN: Blood pressure mildly elevated today despite the fact that his blood pressure was normal on similar medications last Friday. Will not adjust blood pressure medication at this time.  6. HLD: On Lipitor 80 mg daily.  7. Chronic atrial fibrillation: Remain in atrial fibrillation at this point, well controlled on metoprolol. Also on Xarelto for stroke prevention.  8. DM II:  On insulin, his blood sugar was over 400 last week, however according to the patient, he was in a hurry that morning and did not take her insulin.    Medication Adjustments/Labs and Tests Ordered: Current medicines are reviewed at length with the patient today.  Concerns regarding medicines are outlined above.  Medication changes, Labs and Tests ordered today are listed in the Patient Instructions below. Patient Instructions  Medication Instructions: Almyra Deforest, PA-C has recommended making the following medication changes: 1. INCREASE Torsemide to 80 mg twice daily 2. START Metolazone 5 mg - take 1 tablet 3 times weekly - Mondays, Wednesdays, and Fridays  Labwork: Your physician recommends that you return for lab work on [place], 07/02/16.  Testing/Procedures: NONE ORDERED  Follow-up: You have been referred to the heart failure clinic. They will contact you for scheduling.  Isaac Laud recommends that you schedule a follow-up  appointment in 1 month.  If you need a refill on your cardiac medications before your next appointment, please call your pharmacy.    Hilbert Corrigan, [place]  06/28/2016 12:29 PM    [address], [place], [place]  [address] Phone: [phone]; Fax: [phone]

## 2016-06-28 NOTE — Patient Instructions (Signed)
Medication Instructions: Azalee Course, PA-C has recommended making the following medication changes: 1. INCREASE Torsemide to 80 mg twice daily 2. START Metolazone 5 mg - take 1 tablet 3 times weekly - Mondays, Wednesdays, and Fridays  Labwork: Your physician recommends that you return for lab work on FRIDAY, 07/02/16.  Testing/Procedures: NONE ORDERED  Follow-up: You have been referred to the heart failure clinic. They will contact you for scheduling.  Wynema Birch recommends that you schedule a follow-up appointment in 1 month.  If you need a refill on your cardiac medications before your next appointment, please call your pharmacy.

## 2016-07-01 ENCOUNTER — Telehealth (HOSPITAL_COMMUNITY): Payer: Self-pay | Admitting: Vascular Surgery

## 2016-07-01 ENCOUNTER — Telehealth: Payer: Self-pay

## 2016-07-01 NOTE — Telephone Encounter (Signed)
This Case Manager placed call to patient to check on status. Patient denied shortness of breath or any health concerns at this time. Discussed importance of medication compliance, and patient indicated he is taking his medications as prescribed. Patient had Cardiology appointment and placed on torsemide 80 mg twice daily and metolazone 5 mg three times/week on Monday, Wednesday, Friday. Patient indicated he has picked up these medications and is taking them as prescribed. Discussed importance of daily weights with patient, and patient indicated he is aware. However, he is not able to weigh himself because he is not steady when he is on the scale. Educated patient on a low-sodium, heart healthy diet, and patient indicated he was adhering to a low sodium diet. Patient also states he is restricting his fluids to less than 2L/day. Reminded patient of his upcoming Transitional Care follow-up appointment on 07/09/16 at 1400, and patient appreciative of reminder.

## 2016-07-01 NOTE — Telephone Encounter (Signed)
Left pt message to make NP f/u

## 2016-07-08 ENCOUNTER — Other Ambulatory Visit: Payer: Self-pay | Admitting: Endocrinology

## 2016-07-08 ENCOUNTER — Telehealth: Payer: Self-pay

## 2016-07-08 ENCOUNTER — Telehealth (HOSPITAL_COMMUNITY): Payer: Self-pay | Admitting: Vascular Surgery

## 2016-07-08 NOTE — Telephone Encounter (Signed)
This Case Manager placed call to patient to remind him of upcoming Transitional Care follow-up appointment on 07/09/16 at 1400 with Dr. Venetia Night. Call placed to patient at #[phone]; however, unable to reach patient. Voicemail left requesting return call.

## 2016-07-08 NOTE — Telephone Encounter (Signed)
Left pt message to make NP appt  

## 2016-07-09 ENCOUNTER — Ambulatory Visit: Payer: Medicare Other | Admitting: Family Medicine

## 2016-07-09 ENCOUNTER — Telehealth: Payer: Self-pay

## 2016-07-09 NOTE — Telephone Encounter (Signed)
Must keep appt for further refills.  

## 2016-07-09 NOTE — Telephone Encounter (Signed)
Patient cancelled appointment on 07/09/16 at 1400. Call placed to patient to discuss need for rescheduling appointment. Patient indicated he cancelled his appointment because he did not have transportation to his appointment. Patient indicated he has been trying to complete transportation assessment with [facility] but has been told transportation assessment must be done by his Case Worker (Mr. Lestine Box [phone]). He said he has left voicemails with his Case Worker and has not received a return call. This Case Manager informed him that transportation can provided to appointment today; however, patient preferred for appointment to be rescheduled to a later time. Appointment scheduled for 07/16/16 at 1330.   Inquired about patient's status. Patient indicated he was doing well. Denied shortness of breath. He also indicated that his legs were healing and "not weeping" any longer. Inquired if patient found a way to weigh himself daily as daily weights important important for patient's with congestive heart failure. Patinet verbalized understanding. Inquired if patient had a bariatric scale. Patient indicated he has a bariatric scale, and he plans to find a way to use scale. Inquired if patient would like this Case Manager to contact his DSS Case Worker to discuss transportation, and patient agreeable.  Call placed to patient's DSS Case Worker, Mr. Marina Goodell. Informed him that patient needing transportation to medical appointments.  Transportation assessment completed and patient approved for Medicaid transportation services. Transferred to scheduling line; left voicemail indicating ride needed to be scheduled for patient. Call placed to patient to inform him he can now start scheduling rides with Medicaid transportation. Informed him to try to schedule ride for 07/16/16 as this Case Manager had to leave voicemail on scheduling line. Patient verbalized understanding. No additional needs identified.

## 2016-07-15 ENCOUNTER — Telehealth: Payer: Self-pay

## 2016-07-15 NOTE — Telephone Encounter (Signed)
This Case Manager placed call to patient to remind him of his Transitional Care follow-up appointment on 07/16/16 at 1330 with Dr. Venetia Night. Patient indicated he will not be able to make it to his appointment on 07/16/16 and will need to reschedule appointment. Patient indicated he fell last night. Patient denied hitting his head and denied dizziness. Patient indicated he "banged his shoulder" and his legs feel weak since his fall; however, patient indicated he is able to get to bathroom and kitchen in his home without difficulty. Discussed with Dr. Venetia Night who indicated since patient did not hit his head he does not need to go to ED at this time. She indicated patient's sister should check on patient frequently to ensure his status has not changed. Updated patient on Dr. Jen Mow instructions. Patient verbalized understanding. He indicated he is aware when he needs to seek emergency care. Patient's Transitional Care follow-up appointment rescheduled for 07/21/16 at 1400. Reminded patient to arrange Medicaid transportation for his appointment. Patient verbalized understanding.

## 2016-07-16 ENCOUNTER — Inpatient Hospital Stay: Payer: Medicare Other | Admitting: Family Medicine

## 2016-07-19 ENCOUNTER — Other Ambulatory Visit: Payer: Self-pay | Admitting: Pharmacist

## 2016-07-19 MED ORDER — INSULIN GLARGINE 100 UNIT/ML ~~LOC~~ SOLN: 50.0000 [IU] | Freq: Two times a day (BID) | SUBCUTANEOUS | 3 refills | Status: DC

## 2016-07-20 ENCOUNTER — Telehealth: Payer: Self-pay

## 2016-07-20 NOTE — Telephone Encounter (Signed)
Call received from the patient confirming his appointment for tomorrow - 07/21/16 @ 1400.  He then informed this CM that he is " not feeling well."  He said that he fell a couple of times in the past week , possibly 4 times; but no falls in the past 3 days. He said that his legs felt week and he may have tripped for 1 of the falls. He said the most recent fall ( 3 days ago), he fell backwards and hit the back of his head on the door jam.  He thinks that fall may be due to tripping. He also said that one of the prior falls occurred in the bathroom and he fell and hit his forehead. And has an " abrasion" on his forehead. He noted that he is not sure what caused that fall. He said that he did not hit his head with the other falls. He noted that his blood sugars have ranged from 140-300 and he has a wrist BP monitor and his BP has been ' okay."  He did not have a BP reading to report. He said that he went to the cardiologist, Dr [name],  recently and some of his medications were changed and he is taking all of his medications as ordered.  He stated that he has not had any headaches at all.  He stated that he had episodes of occasional dizziness but none at the present time. No chest pain reported. No problems with his vision reported.   Reviewed with him when to seek medical attention in the ED. He stated that he understood and did not feel it was necessary to go to the ED at this time.  Stressed with him the importance of keeping his appointment at [facility] tomorrow and encouraged him to confirm his transportation to the clinic. He said that he would check with his sister to inquire if she is able to accompany him. Encouraged him not to drive due to his current complaints and falls. He stated that he understood. Instructed him to call the clinic in the morning if he does not have anyone to drive him and a cab an be arranged and again he stated that he understood. Explained  to him that this CM would inform Dr Venetia Night of his  falls and call him back if she had any additional instructions for him.  Update provided to Dr Venetia Night who noted that the patient should come to his appointment tomorrow.

## 2016-07-20 NOTE — Telephone Encounter (Signed)
Attempted to contact the patient to check on his status and to remind him of his appointment at the [facility]  - [facility] tomorrow - 07/21/16 @ 1400. Call placed to # [phone] (H) and a HIPAA compliant voicemail message was left requesting a call back to # [phone] or [phone].

## 2016-07-21 ENCOUNTER — Encounter: Payer: Self-pay | Admitting: Family Medicine

## 2016-07-21 ENCOUNTER — Ambulatory Visit: Payer: Medicare Other | Attending: Family Medicine | Admitting: Family Medicine

## 2016-07-21 VITALS — BP 125/63 | HR 101 | Temp 97.5°F | Wt 361.8 lb

## 2016-07-21 DIAGNOSIS — I482 Chronic atrial fibrillation, unspecified: Secondary | ICD-10-CM

## 2016-07-21 DIAGNOSIS — Z7982 Long term (current) use of aspirin: Secondary | ICD-10-CM | POA: Insufficient documentation

## 2016-07-21 DIAGNOSIS — N183 Chronic kidney disease, stage 3 unspecified: Secondary | ICD-10-CM

## 2016-07-21 DIAGNOSIS — I5032 Chronic diastolic (congestive) heart failure: Secondary | ICD-10-CM

## 2016-07-21 DIAGNOSIS — I872 Venous insufficiency (chronic) (peripheral): Secondary | ICD-10-CM | POA: Diagnosis not present

## 2016-07-21 DIAGNOSIS — K922 Gastrointestinal hemorrhage, unspecified: Secondary | ICD-10-CM | POA: Diagnosis not present

## 2016-07-21 DIAGNOSIS — I5033 Acute on chronic diastolic (congestive) heart failure: Secondary | ICD-10-CM | POA: Diagnosis not present

## 2016-07-21 DIAGNOSIS — E1140 Type 2 diabetes mellitus with diabetic neuropathy, unspecified: Secondary | ICD-10-CM | POA: Diagnosis present

## 2016-07-21 DIAGNOSIS — I130 Hypertensive heart and chronic kidney disease with heart failure and stage 1 through stage 4 chronic kidney disease, or unspecified chronic kidney disease: Secondary | ICD-10-CM | POA: Insufficient documentation

## 2016-07-21 DIAGNOSIS — M7989 Other specified soft tissue disorders: Secondary | ICD-10-CM | POA: Insufficient documentation

## 2016-07-21 DIAGNOSIS — G4733 Obstructive sleep apnea (adult) (pediatric): Secondary | ICD-10-CM | POA: Insufficient documentation

## 2016-07-21 DIAGNOSIS — G5603 Carpal tunnel syndrome, bilateral upper limbs: Secondary | ICD-10-CM | POA: Diagnosis not present

## 2016-07-21 DIAGNOSIS — E118 Type 2 diabetes mellitus with unspecified complications: Secondary | ICD-10-CM

## 2016-07-21 DIAGNOSIS — IMO0002 Reserved for concepts with insufficient information to code with codable children: Secondary | ICD-10-CM

## 2016-07-21 DIAGNOSIS — I2510 Atherosclerotic heart disease of native coronary artery without angina pectoris: Secondary | ICD-10-CM | POA: Insufficient documentation

## 2016-07-21 DIAGNOSIS — E781 Pure hyperglyceridemia: Secondary | ICD-10-CM | POA: Diagnosis not present

## 2016-07-21 DIAGNOSIS — R42 Dizziness and giddiness: Secondary | ICD-10-CM | POA: Insufficient documentation

## 2016-07-21 DIAGNOSIS — I4891 Unspecified atrial fibrillation: Secondary | ICD-10-CM | POA: Insufficient documentation

## 2016-07-21 DIAGNOSIS — I480 Paroxysmal atrial fibrillation: Secondary | ICD-10-CM | POA: Insufficient documentation

## 2016-07-21 DIAGNOSIS — W010XXA Fall on same level from slipping, tripping and stumbling without subsequent striking against object, initial encounter: Secondary | ICD-10-CM | POA: Diagnosis not present

## 2016-07-21 DIAGNOSIS — Z7901 Long term (current) use of anticoagulants: Secondary | ICD-10-CM | POA: Insufficient documentation

## 2016-07-21 DIAGNOSIS — E7800 Pure hypercholesterolemia, unspecified: Secondary | ICD-10-CM | POA: Insufficient documentation

## 2016-07-21 DIAGNOSIS — Z794 Long term (current) use of insulin: Secondary | ICD-10-CM | POA: Insufficient documentation

## 2016-07-21 DIAGNOSIS — Z9181 History of falling: Secondary | ICD-10-CM

## 2016-07-21 DIAGNOSIS — D509 Iron deficiency anemia, unspecified: Secondary | ICD-10-CM | POA: Diagnosis not present

## 2016-07-21 DIAGNOSIS — Z9889 Other specified postprocedural states: Secondary | ICD-10-CM | POA: Insufficient documentation

## 2016-07-21 DIAGNOSIS — I878 Other specified disorders of veins: Secondary | ICD-10-CM | POA: Insufficient documentation

## 2016-07-21 DIAGNOSIS — E1165 Type 2 diabetes mellitus with hyperglycemia: Secondary | ICD-10-CM | POA: Diagnosis present

## 2016-07-21 DIAGNOSIS — Z79899 Other long term (current) drug therapy: Secondary | ICD-10-CM | POA: Insufficient documentation

## 2016-07-21 DIAGNOSIS — E1122 Type 2 diabetes mellitus with diabetic chronic kidney disease: Secondary | ICD-10-CM | POA: Diagnosis present

## 2016-07-21 LAB — COMPLETE METABOLIC PANEL WITH GFR
ALK PHOS: 88 U/L (ref 40–115)
ALT: 36 U/L (ref 9–46)
AST: 47 U/L — AB (ref 10–35)
Albumin: 3.4 g/dL — ABNORMAL LOW (ref 3.6–5.1)
BUN: 39 mg/dL — AB (ref 7–25)
CALCIUM: 8.7 mg/dL (ref 8.6–10.3)
CO2: 32 mmol/L — ABNORMAL HIGH (ref 20–31)
Creat: 1.75 mg/dL — ABNORMAL HIGH (ref 0.70–1.25)
GFR, EST NON AFRICAN AMERICAN: 40 mL/min — AB (ref >=60)
GFR, Est African American: 47 mL/min — ABNORMAL LOW (ref >=60)
GLUCOSE: 336 mg/dL — AB (ref 65–99)
POTASSIUM: 2.5 mmol/L — AB (ref 3.5–5.3)
SODIUM: 124 mmol/L — AB (ref 135–146)
Total Bilirubin: 0.6 mg/dL (ref 0.2–1.2)
Total Protein: 7.2 g/dL (ref 6.1–8.1)

## 2016-07-21 LAB — GLUCOSE, POCT (MANUAL RESULT ENTRY): POC Glucose: 383 mg/dl — AB (ref 70–99)

## 2016-07-21 MED ORDER — MUPIROCIN CALCIUM 2 % EX CREA: TOPICAL_CREAM | Freq: Two times a day (BID) | CUTANEOUS | 2 refills | Status: DC

## 2016-07-21 NOTE — Progress Notes (Signed)
Subjective:    Patient ID: [name], male    DOB: 1950-09-19, 66 y.o.   MRN: [id number]  HPI [name] is a 66 year old male with a history of morbid obesity, type 2 diabetes mellitus (A1c 8.1 from 05/2016 ), hypertension,OSA (unable to tolerate CPAP), intermittent oxygen use, CAD (status post PCI in 2012), chronic diastolic heart failure (EF 60-65% from 2-D echo 06/2016), atrial fibrillation (s/p cardioversion in the past) who presents today for a follow-up from hospitalization.  He was admitted for acute on chronic diastolic heart failure and was noticed to have significant weight gain, worsening dyspnea and lower extremity edema with lower extremity weeping. Was diuresed with IV Lasix with resulting weight loss of 19 pounds; this was later switched to torsemide on which he was discharged.  2-D echo (from 06/2016): EF 60-65%, no regional wall motion abnormalities 2-D echo (from 11/2014): EF 50-55%, no regional wall motion abnormalities LHC &RHC (from 01/2014):Stenosis of 40-50% mid LAD, 50% circumflex and 30% RCA lesions; LVEF 65%   Today he informs me his leg swelling has improved as he is currently taking an increased dose of torsemide-80 mg twice daily. He does have some shortness of breath which is at baseline and some orthopnea. He has lost 16 pounds since his last visit 3 weeks ago. He endorses a history of falls; on one location he slipped while getting off the bed, and stumbled on another occasion while walking. He did have an episode of dizziness in the tub but his blood sugar was negative for hypoglycemia. He sustained a Guzik on his forehead which has resolved and denies any headaches or blurry vision. He does have a walker but is unable to use it at home due to the narrow space   Past Medical History:  Diagnosis Date  . Arthritis    "hands and lower back" (09/19/2014)  . CAD (coronary artery disease)    a. s/p PCI to RCA in 2012. b. prior cath in 01/2014 with  elevated L/RH pressures, mild-mod CAD of LAD/LCx with patent RCA, normal EF, c/b CIN/CHF.  [name] Carpal tunnel syndrome, bilateral   . Cellulitis   . Chronic diastolic CHF (congestive heart failure) ([place])   . Chronic kidney disease (CKD), stage III (moderate)   . Chronic lower back pain   . History of blood transfusion ~ 1954   "related to OR"  . Hyperlipidemia   . Hypertension   . Hypoxia    a. Qualified for home O2 at [facility] in 09/2014.  [name] Lower GI bleed   . Microcytic anemia   . Morbid obesity ([place])   . Neuropathy ([place])   . OSA (obstructive sleep apnea)    "I wear nasal prongs; haven't been using prongs recently" (09/19/2014)  . PAF (paroxysmal atrial fibrillation) ([place])    TEE DCCV 09/23/2014  . Physical deconditioning   . Pilonidal cyst 1980's; 01/25/2013  . Scrotal abscess   . Type II diabetes mellitus ([place])     Past Surgical History:  Procedure Laterality Date  . ABDOMINAL SURGERY  ~ 1954   BENIGN TUMOR REMOVED  . APPENDECTOMY    . CARDIOVERSION  2010   Archie Endo 09/19/2014  . CARDIOVERSION N/A 09/23/2014   Procedure: CARDIOVERSION;  Surgeon: Lelon Perla, MD;  Location: [facility];  Service: Cardiovascular;  Laterality: N/A;  . CARDIOVERSION N/A 12/11/2014   Procedure: CARDIOVERSION;  Surgeon: Sanda Klein, MD;  Location: [facility] ENDOSCOPY;  Service: Cardiovascular;  Laterality: N/A;  . CATARACT EXTRACTION W/PHACO  Right 11/15/2012   Procedure: CATARACT EXTRACTION PHACO AND INTRAOCULAR LENS PLACEMENT (IOC);  Surgeon: Adonis Brook, MD;  Location: [place];  Service: Ophthalmology;  Laterality: Right;  . CATARACT EXTRACTION W/PHACO Left 11/29/2012   Procedure: CATARACT EXTRACTION PHACO AND INTRAOCULAR LENS PLACEMENT (IOC);  Surgeon: Adonis Brook, MD;  Location: [place];  Service: Ophthalmology;  Laterality: Left;  . CORONARY ANGIOPLASTY WITH STENT PLACEMENT  August 2012   RCA DES - [facility]  . DEBRIDEMENT  FOOT Left    debriding diabetic foot ulcers  . EYE  SURGERY    . FOREIGN BODY REMOVAL Right 2014   heel,  splinter removal   . LEFT AND RIGHT HEART CATHETERIZATION WITH CORONARY ANGIOGRAM N/A 01/31/2014   Procedure: LEFT AND RIGHT HEART CATHETERIZATION WITH CORONARY ANGIOGRAM;  Surgeon: Blane Ohara, MD;  Location[facility];  Service: Cardiovascular;  Laterality: N/A;  . PARS PLANA VITRECTOMY Left 06/05/2013   Procedure: PARS PLANA VITRECTOMY WITH 23 GAUGE with Endolaser(constellation);  Surgeon: Adonis Brook, MD;  Location: [place];  Service: Ophthalmology;  Laterality: Left;  . PILONIDAL CYST EXCISION N/A 01/08/2013   Procedure: CYST EXCISION PILONIDAL EXTENSIVE;  Surgeon: Ralene Ok, MD;  Location: [place];  Service: General;  Laterality: N/A;  . PILONIDAL CYST EXCISION  1980's   "in [place]"  . TEE WITHOUT CARDIOVERSION N/A 09/23/2014   Procedure: TRANSESOPHAGEAL ECHOCARDIOGRAM (TEE);  Surgeon: Lelon Perla, MD;  Location: [facility] ENDOSCOPY;  Service: Cardiovascular;  Laterality: N/A;  . TONSILLECTOMY      No Known Allergies  Current Outpatient Prescriptions on File Prior to Visit  Medication Sig Dispense Refill  . ACCU-CHEK AVIVA PLUS test strip USE AS INSTRUCTED TO TEST BLOOD SUGAR 6 TIMES DAILY BEFORE MEALS. 100 each 2  . aspirin EC 81 MG EC tablet Take 1 tablet (81 mg total) by mouth daily. 30 tablet 2  . atorvastatin (LIPITOR) 80 MG tablet Take 1 tablet (80 mg total) by mouth daily. 30 tablet 10  . BD INSULIN SYRINGE ULTRAFINE 31G X 15/64" 0.3 ML MISC USE TO IJNECT INSULIN 3 TIMES DAILY.  5  . Blood Glucose Monitoring Suppl (ACCU-CHEK AVIVA PLUS) w/Device KIT Use to check blood sugar 6 times per day dx code E11.65 1 kit 0  . gabapentin (NEURONTIN) 300 MG capsule Take 2 capsules (600 mg total) by mouth 2 (two) times daily. 60 capsule 3  . HUMALOG KWIKPEN 200 UNIT/ML SOPN INJECT 80 TO 100 UNITS 3 TIMES A DAY DEPENDING ON MEAL SIZE 48 pen 0  . HUMALOG KWIKPEN 200 UNIT/ML SOPN INJECT 80 TO 100 UNITS 3 TIMES A DAY DEPENDING ON MEAL  SIZE 200 mL 2  . hydrocerin (EUCERIN) CREA Apply 1 application topically 3 (three) times daily. legs 454 g 3  . Hypromellose (NATURAL BALANCE TEARS OP) Place 1 drop into both eyes daily as needed (for dry eyes).     . insulin glargine (LANTUS) 100 UNIT/ML injection Inject 0.5 mLs (50 Units total) into the skin 2 (two) times daily. 30 mL 3  . metoprolol (LOPRESSOR) 50 MG tablet Take 50 mg by mouth 2 (two) times daily. TWO TABLETS TWICE A DAY    . Multiple Vitamin (MULTIVITAMIN WITH MINERALS) TABS Take 1 tablet by mouth daily.    [name] oxyCODONE 20 MG TABS Take 1 tablet (20 mg total) by mouth every 6 (six) hours as needed for severe pain. (Patient taking differently: Take 20-30 mg by mouth every 6 (six) hours as needed for severe  pain. ) 30 tablet 0  . potassium chloride SA (K-DUR,KLOR-CON) 10 MEQ tablet Take 3 tablets (30 mEq total) by mouth 2 (two) times daily. 200 tablet 1  . rivaroxaban (XARELTO) 20 MG TABS tablet Take 1 tablet (20 mg total) by mouth daily with supper. 30 tablet 3  . spironolactone (ALDACTONE) 25 MG tablet Take 0.5 tablets (12.5 mg total) by mouth daily. 90 tablet 3  . torsemide (DEMADEX) 20 MG tablet Take 4 tablets (80 mg total) by mouth 2 (two) times daily. 240 tablet 11  . metolazone (ZAROXOLYN) 5 MG tablet Take 1 tablet (5 mg total) by mouth 3 (three) times a week. Mondays, Wednesdays, and Fridays (Patient not taking: Reported on 07/21/2016) 15 tablet 11   No current facility-administered medications on file prior to visit.      Review of Systems Review of Systems  Constitutional: Negative for activity change and appetite change.  HENT: Negative for sinus pressure and sore throat.   Eyes: Negative for visual disturbance.  Respiratory: Positive for shortness of breath which is at baseline. Negative for cough and chest tightness.   Cardiovascular: Negative for leg swelling Negative for chest pain.  Gastrointestinal: Negative for abdominal distention, abdominal pain,  constipation and diarrhea.  Endocrine: Negative.   Genitourinary: Negative for dysuria.  Musculoskeletal: Negative for joint swelling and myalgias.  Skin: Negative for rash.  Allergic/Immunologic: Negative.   Neurological: Negative for weakness, light-headedness and numbness.  Psychiatric/Behavioral: Negative for dysphoric mood and suicidal ideas.     Objective: Vitals:   07/21/16 1404  BP: 125/63  Pulse: (!) 101  Temp: 97.5 F (36.4 C)  TempSrc: Oral  SpO2: 97%  Weight: (!) 361 lb 12.8 oz (164.1 kg)       Physical Exam Constitutional: He is oriented to person, place, and time. He appears well-developed and well-nourished.  Morbidly obese  Neck: No JVD present.  Cardiovascular: Tachycardic rate, normal heart sounds and intact distal pulses.  An irregularly irregular rhythm present.  No murmur heard. Pulmonary/Chest: Effort normal and breath sounds normal. He has no wheezes. He has no rales. He exhibits no tenderness.  Abdominal: Soft. Bowel sounds are normal. He exhibits no distension and no mass. There is no tenderness.  Musculoskeletal: Normal range of motion. He exhibits no edema  Neurological: He is alert and oriented to person, place, and time.  Skin: There is mild erythema (erythema of bilateral lower extremities; rash in keeping with stasis dermatitis) which has improved significantly.  Psychiatric: He has a normal mood and affect.    Lab Results  Component Value Date   HGBA1C 8.1 (H) 06/10/2016       Assessment & Plan:  1. Uncontrolled type 2 diabetes mellitus with complication, without long-term current use of insulin ([place]) Uncontrolled with A1c of 8.1 Blood sugar of 383; we'll hold off on administering insulin as he administered 100 units of Humulin N, 1 hour ago Increase Lantus to 55 units twice daily until his visit with his endocrinologist Continue Humulin R which was previously prescribed   2. Pure hypercholesterolemia Mild  hypertriglyceridemia Continue statin  3. CKD (chronic kidney disease) stage 3, GFR 30-59 ml/min We'll check renal function and potassium Avoid nephrotoxins - COMPLETE METABOLIC PANEL WITH GFR  4. Chronic diastolic congestive heart failure (HCC) EF 60-65% from 2-D echo of 06/2016 He has lost 16 pounds since his last visit Continue torsemide, metolazone  Restrict fluid intake to less than 2L per day, low-sodium diet  5. Atrial fibrillation with RVR ([place])  Continue anticoagulation with Xarelto and rate control with metoprolol  6. Venous stasis dermatitis of both lower extremities Improved Advised to elevate feet and avoid dependent position as much as possible  7. High risk for falls He has had no hypoglycemia or hypotension to explain falls Due to morbid obesity, diabetic neuropathy Prescription written for 4 pronged cane as he is unable to use his walker due to insufficient space at home.

## 2016-07-21 NOTE — Progress Notes (Signed)
Ran out of humalog- been taking humulin R 500 which he had from another time

## 2016-07-22 ENCOUNTER — Telehealth: Payer: Self-pay

## 2016-07-22 ENCOUNTER — Telehealth: Payer: Self-pay | Admitting: Family Medicine

## 2016-07-22 NOTE — Telephone Encounter (Signed)
This Case Manager attempted to reach patient once again to discuss critical potassium level; however, unable to reach patient. HIPPA compliant voicemail left requesting return call.   Dr. Venetia Night wrote order for four pronged cane. Order faxed to [facility] [phone]).

## 2016-07-22 NOTE — Telephone Encounter (Signed)
Received a return call from patient. Informed patient that his potassium level was critically low  (2.5) compared to previous labs. Inquired if patient taking his potassium as prescribed. Patient indicated he ran out of Potassium supplement (30 mEq twice daily) about 4-5 days ago so he has not taken medication for 4-5 days. He indicated he picked up a refill of Potassium today but has not resumed taking medication yet. Informed patient he should begin taking medication again immediately. Also inquired if patient taking spironolactone 12.5 mg daily as prescribed, and patient indicated he has been compliant with spironolactone.   Updated Dr. Venetia Night on conversation with patient. She indicated patient should resume his home potassium dose. This Case Manager updated patient, and patient agreeable. Patient indicated he will start taking Potassium supplement again immediately.   Also informed patient that Dr. Venetia Night wrote an order for a four-pronged cane for patient today. Informed him that DME order was sent to [facility] today. Patient appreciative. No additional needs identified at this time.

## 2016-07-22 NOTE — Telephone Encounter (Signed)
-----   Message from Jaclyn Shaggy, MD sent at 07/22/2016 12:29 PM EST ----- Potassium is critically low compared to previous labs; if he has run out of  potassium could he please get to the pharmacy to pick it up and resume taking it? If he has been compliant with potassium I would like to know so I could send an increased dose to his pharmacy.

## 2016-07-22 NOTE — Telephone Encounter (Signed)
This Case Manager placed call to patient to inform him of his potassium level. Call placed to patient at #[phone]; however, unable to reach patient. HIPPA compliant voicemail left requesting return call. In addition, call placed to patient's emergency contact, Handsome Anglin [phone]); unable to reach. Message left requesting patient return call to this Case Manager when possible.

## 2016-07-22 NOTE — Telephone Encounter (Signed)
Called patient to discuss critical potassium; unable to reach patient. We will keep trying and informed office staff to do so as well.

## 2016-07-23 ENCOUNTER — Telehealth: Payer: Self-pay | Admitting: Endocrinology

## 2016-07-23 ENCOUNTER — Telehealth: Payer: Self-pay

## 2016-07-23 NOTE — Telephone Encounter (Signed)
This Case Manager placed call to [facility] to determine if they received order for four-pronged cane. Spoke with Barbara Cower who indicated they received order and cane is being shipped to patient's home.

## 2016-07-23 NOTE — Telephone Encounter (Signed)
Patient need refill of pen needles send to  [id number] [place], [place] - [address] [phone] (Phone) [phone] (Fax)

## 2016-07-26 ENCOUNTER — Other Ambulatory Visit: Payer: Medicare Other

## 2016-07-29 ENCOUNTER — Ambulatory Visit: Payer: Medicare Other | Admitting: Endocrinology

## 2016-07-30 ENCOUNTER — Telehealth: Payer: Self-pay

## 2016-07-30 NOTE — Telephone Encounter (Signed)
This Case Manager received return call from patient. Patient indicated he is taking his Potassium as prescribed and no longer has leg weakness. Patient also denied falls. Discussed need for a follow-up appointment, and patient agreeable. Appointment scheduled for 08/06/16 at 1400 with Dr. Venetia Night. Patient indicated he will schedule Medicaid transportation to his upcoming appointment. He also indicated he has a Cardiology appointment on 08/02/16 at 1500 and has arranged Medicaid transportation to his appointment. No additional needs identified.

## 2016-07-30 NOTE — Telephone Encounter (Signed)
This Case Manager placed call to patient to check status and to discuss scheduling follow-up appointment with Dr. Venetia Night. Call placed to #[phone]; however, unable to reach patient. HIPPA compliant voicemail left requesting return call.

## 2016-07-30 NOTE — Progress Notes (Signed)
Per Dr Venetia Night patient's low potassium lab form 07/22/16 has been addressed.

## 2016-08-02 ENCOUNTER — Encounter: Payer: Self-pay | Admitting: Physician Assistant

## 2016-08-02 ENCOUNTER — Ambulatory Visit (INDEPENDENT_AMBULATORY_CARE_PROVIDER_SITE_OTHER): Payer: Medicare Other | Admitting: Physician Assistant

## 2016-08-02 ENCOUNTER — Ambulatory Visit: Payer: Medicare Other | Admitting: Endocrinology

## 2016-08-02 VITALS — BP 137/66 | HR 70 | Ht 72.0 in | Wt 375.8 lb

## 2016-08-02 DIAGNOSIS — N183 Chronic kidney disease, stage 3 unspecified: Secondary | ICD-10-CM

## 2016-08-02 DIAGNOSIS — E785 Hyperlipidemia, unspecified: Secondary | ICD-10-CM

## 2016-08-02 DIAGNOSIS — E1139 Type 2 diabetes mellitus with other diabetic ophthalmic complication: Secondary | ICD-10-CM

## 2016-08-02 DIAGNOSIS — E1165 Type 2 diabetes mellitus with hyperglycemia: Secondary | ICD-10-CM

## 2016-08-02 DIAGNOSIS — I482 Chronic atrial fibrillation, unspecified: Secondary | ICD-10-CM

## 2016-08-02 DIAGNOSIS — I5033 Acute on chronic diastolic (congestive) heart failure: Secondary | ICD-10-CM

## 2016-08-02 DIAGNOSIS — I10 Essential (primary) hypertension: Secondary | ICD-10-CM

## 2016-08-02 DIAGNOSIS — I2510 Atherosclerotic heart disease of native coronary artery without angina pectoris: Secondary | ICD-10-CM | POA: Diagnosis not present

## 2016-08-02 DIAGNOSIS — E876 Hypokalemia: Secondary | ICD-10-CM

## 2016-08-02 NOTE — Patient Instructions (Signed)
Medication Instructions:  DECREASE YOUR METOLAZONE TO 3 TIMES A WEEK  Labwork: NONE   Testing/Procedures: NONE  Follow-Up: Your physician recommends that you schedule a follow-up appointment in: 2-3 MONTHS WITH DR CRENSHAW  KEEP YOUR APPOINTMENT LATER THIS WEEK WITH DR Gala Romney  Any Other Special Instructions Will Be Listed Below (If Applicable). WORK ON WEIGHT LOSS   If you need a refill on your cardiac medications before your next appointment, please call your pharmacy.

## 2016-08-02 NOTE — Progress Notes (Signed)
Cardiology Office Note    Date:  08/02/2016   ID:  [name], DOB [birth year], MRN [id number]  PCP:  [name], MD  Cardiologist:  Dr. Stanford Breed   Chief Complaint  Patient presents with  . Follow-up    seen for Dr. Stanford Breed.     History of Present Illness:  [name] is a 66 y.o. male  with PMH of morbid obesity, CAD, chronic diastolic heart failure, stage III CKD, HTN, HLD, DM II, chronic atrial fibrillation and OSA. He had PCI to his RCA in 2012, repeat cath is July 2015 showed 40-50% mid LAD, 50% circumflex and 30% RCA. Ejection fraction 65%. He did develop contrast nephropathy. He has chronic atrial fibrillation and felt to hold sinus rhythm after TEE DCCV in March 2016. Dr. Percival Spanish stopped his amiodarone in December 2016, however he was still on amiodarone by the time he followed up in August 2017. He had problem manage PT/INR with Coumadin due to transportation issues and later was switched to Xarelto. He was admitted in March 2017 for sepsis and a scrotal abscess. He was admitted again in June after a fall at home. He was down for roughly 10 hours and had acute kidney injury and rhabdomyolysis. He had a syncope with micturition in August. His Aldactone was decreased to 12.5 mg and Lasix was cut back to 80 mg BID after holding it for 2 doses. He was instructed to resume Lasix at 80 mg BID and did take metolazone 5 mg PRN if his weight is above 375 pounds.  He was most recently admitted on 06/09/2016 for increased leg swelling. He also gained roughly 20 pounds as well. Echocardiogram obtained on 06/11/2016 showed EF 60-65%, no RWMA. He responded well to diuresis, he was eventually discharged on torsemide 60 mg BID. His discharge weight was 362 pounds. His spironolactone was held due to renal function and the plan was to resume after discharge. He was seen by his PCP on 06/25/2016, he returned he never stopped his spironolactone after discharge. I saw the patient on  06/28/2016, by which time, his PCP has increased his torsemide to 80 mg twice a day. He appeared to be fluid overloaded during the visit was made, it was stopped in October due to renal issues. Given his recurrent problem with volume overload, I recommended him to restart on the metolazone. I started him on metolazone 5 mg every Monday Wednesday Friday.  He presents today for cardiology office visit. He has been picking his skin on his Lasix, he has several open wound which were weeping clear fluid. Despite this, his legs have hard woody appearance on physical exam. Weight wise, he has also gained roughly 10 pounds, currently at 375 lbs (today's weight was measured twice to ensure accuracy). For the entire past week, he has been taking metolazone on a daily basis instead of 3 times weekly as previously instructed. Also his renal function is worsening based on the last lab obtained at the PCPs office on 07/21/2016. He still have not been seen by heart failure service, it is very difficult to evaluate his volume status on physical exam. Despite his renal function slightly worsening on diuresis, he continued to have weeping lower extremity. I think would be best for him to be evaluated by heart failure clinic to determine if more aggressive diuresis is needed versus cutting back on the diuretic. Unfortunately, he his long-term prognosis would not be good, I think he will continue to have volume  issue for the foreseeable future. He is very debilitated, I did recommend him to lose weight, however I do not think this can be accomplished given his current condition. Also, he recently ran out of his potassium supplement for several days and his potassium dropped to critical level as he continues to take diuretic. This will show on the last lab obtained by Dr. Jarold Song will have refilled his potassium supplement since. I did recommend him to obtain basic metabolic panel today, however he wish to wait until later this week as it  was difficult for him to walk down stairs.    Past Medical History:  Diagnosis Date  . Arthritis    "hands and lower back" (09/19/2014)  . CAD (coronary artery disease)    a. s/p PCI to RCA in 2012. b. prior cath in 01/2014 with elevated L/RH pressures, mild-mod CAD of LAD/LCx with patent RCA, normal EF, c/b CIN/CHF.  [name] Carpal tunnel syndrome, bilateral   . Cellulitis   . Chronic diastolic CHF (congestive heart failure) ([place])   . Chronic kidney disease (CKD), stage III (moderate)   . Chronic lower back pain   . History of blood transfusion ~ 1954   "related to OR"  . Hyperlipidemia   . Hypertension   . Hypoxia    a. Qualified for home O2 at [place] in 09/2014.  [name] Lower GI bleed   . Microcytic anemia   . Morbid obesity ([place])   . Neuropathy ([place])   . OSA (obstructive sleep apnea)    "I wear nasal prongs; haven't been using prongs recently" (09/19/2014)  . PAF (paroxysmal atrial fibrillation) ([place])    TEE DCCV 09/23/2014  . Physical deconditioning   . Pilonidal cyst 1980's; 01/25/2013  . Scrotal abscess   . Type II diabetes mellitus ([place])     Past Surgical History:  Procedure Laterality Date  . ABDOMINAL SURGERY  ~ 1954   BENIGN TUMOR REMOVED  . APPENDECTOMY    . CARDIOVERSION  2010   Archie Endo 09/19/2014  . CARDIOVERSION N/A 09/23/2014   Procedure: CARDIOVERSION;  Surgeon: Lelon Perla, MD;  Location[facility] ENDOSCOPY;  Service: Cardiovascular;  Laterality: N/A;  . CARDIOVERSION N/A 12/11/2014   Procedure: CARDIOVERSION;  Surgeon: Sanda Klein, MD;  Location: [facility] ENDOSCOPY;  Service: Cardiovascular;  Laterality: N/A;  . CATARACT EXTRACTION W/PHACO Right 11/15/2012   Procedure: CATARACT EXTRACTION PHACO AND INTRAOCULAR LENS PLACEMENT ([place]);  Surgeon: Adonis Brook, MD;  Location: [place];  Service: Ophthalmology;  Laterality: Right;  . CATARACT EXTRACTION W/PHACO Left 11/29/2012   Procedure: CATARACT EXTRACTION PHACO AND INTRAOCULAR LENS PLACEMENT (IOC);  Surgeon: Adonis Brook, MD;  Location:  [place];  Service: Ophthalmology;  Laterality: Left;  . CORONARY ANGIOPLASTY WITH STENT PLACEMENT  August 2012   RCA DES - [facility]  . DEBRIDEMENT  FOOT Left    debriding diabetic foot ulcers  . EYE SURGERY    . FOREIGN BODY REMOVAL Right 2014   heel,  splinter removal   . LEFT AND RIGHT HEART CATHETERIZATION WITH CORONARY ANGIOGRAM N/A 01/31/2014   Procedure: LEFT AND RIGHT HEART CATHETERIZATION WITH CORONARY ANGIOGRAM;  Surgeon: Blane Ohara, MD;  Location: [facility] CATH LAB;  Service: Cardiovascular;  Laterality: N/A;  . PARS PLANA VITRECTOMY Left 06/05/2013   Procedure: PARS PLANA VITRECTOMY WITH 23 GAUGE with Endolaser(constellation);  Surgeon: Adonis Brook, MD;  Location: [place];  Service: Ophthalmology;  Laterality: Left;  . PILONIDAL CYST EXCISION N/A 01/08/2013   Procedure: CYST EXCISION  PILONIDAL EXTENSIVE;  Surgeon: Ralene Ok, MD;  Location: [place];  Service: General;  Laterality: N/A;  . PILONIDAL CYST EXCISION  1980's   "in [place]"  . TEE WITHOUT CARDIOVERSION N/A 09/23/2014   Procedure: TRANSESOPHAGEAL ECHOCARDIOGRAM (TEE);  Surgeon: Lelon Perla, MD;  Location[facility] ENDOSCOPY;  Service: Cardiovascular;  Laterality: N/A;  . TONSILLECTOMY      Current Medications: Outpatient Medications Prior to Visit  Medication Sig Dispense Refill  . ACCU-CHEK AVIVA PLUS test strip USE AS INSTRUCTED TO TEST BLOOD SUGAR 6 TIMES DAILY BEFORE MEALS. 100 each 2  . aspirin EC 81 MG EC tablet Take 1 tablet (81 mg total) by mouth daily. 30 tablet 2  . atorvastatin (LIPITOR) 80 MG tablet Take 1 tablet (80 mg total) by mouth daily. 30 tablet 10  . BD INSULIN SYRINGE ULTRAFINE 31G X 15/64" 0.3 ML MISC USE TO IJNECT INSULIN 3 TIMES DAILY.  5  . Blood Glucose Monitoring Suppl (ACCU-CHEK AVIVA PLUS) w/Device KIT Use to check blood sugar 6 times per day dx code E11.65 1 kit 0  . gabapentin (NEURONTIN) 300 MG capsule Take 2 capsules (600 mg total) by mouth 2 (two) times  daily. 60 capsule 3  . HUMALOG KWIKPEN 200 UNIT/ML SOPN INJECT 80 TO 100 UNITS 3 TIMES A DAY DEPENDING ON MEAL SIZE 48 pen 0  . HUMALOG KWIKPEN 200 UNIT/ML SOPN INJECT 80 TO 100 UNITS 3 TIMES A DAY DEPENDING ON MEAL SIZE 200 mL 2  . hydrocerin (EUCERIN) CREA Apply 1 application topically 3 (three) times daily. legs 454 g 3  . Hypromellose (NATURAL BALANCE TEARS OP) Place 1 drop into both eyes daily as needed (for dry eyes).     . insulin glargine (LANTUS) 100 UNIT/ML injection Inject 0.5 mLs (50 Units total) into the skin 2 (two) times daily. 30 mL 3  . metolazone (ZAROXOLYN) 5 MG tablet Take 1 tablet (5 mg total) by mouth 3 (three) times a week. Mondays, Wednesdays, and Fridays 15 tablet 11  . metoprolol (LOPRESSOR) 50 MG tablet Take 50 mg by mouth 2 (two) times daily. TWO TABLETS TWICE A DAY    . Multiple Vitamin (MULTIVITAMIN WITH MINERALS) TABS Take 1 tablet by mouth daily.    . mupirocin cream (BACTROBAN) 2 % Apply topically 2 (two) times daily. 30 g 2  . oxyCODONE 20 MG TABS Take 1 tablet (20 mg total) by mouth every 6 (six) hours as needed for severe pain. (Patient taking differently: Take 20-30 mg by mouth every 6 (six) hours as needed for severe pain. ) 30 tablet 0  . rivaroxaban (XARELTO) 20 MG TABS tablet Take 1 tablet (20 mg total) by mouth daily with supper. 30 tablet 3  . torsemide (DEMADEX) 20 MG tablet Take 4 tablets (80 mg total) by mouth 2 (two) times daily. 240 tablet 11  . potassium chloride SA (K-DUR,KLOR-CON) 10 MEQ tablet Take 3 tablets (30 mEq total) by mouth 2 (two) times daily. 200 tablet 1  . spironolactone (ALDACTONE) 25 MG tablet Take 0.5 tablets (12.5 mg total) by mouth daily. 90 tablet 3   No facility-administered medications prior to visit.      Allergies:   Patient has no known allergies.   Social History   Social History  . Marital status: Single    Spouse name: N/A  . Number of children: Y  . Years of education: N/A   Occupational History  . disabled  truck driver    Social History Main Topics  .  Smoking status: Former Smoker    Packs/day: 1.00    Years: 20.00    Types: Cigarettes    Quit date: 07/13/1983  . Smokeless tobacco: Never Used  . Alcohol use No  . Drug use: No  . Sexual activity: Not Currently   Other Topics Concern  . None   Social History Narrative   Pt is single.   Lives with sister.   Has children.   Was adopted.   Daily cafffiene-2 cups of coffee and 2 sodas per day           Family History:  The patient's family history includes Other in his other. He was adopted.   ROS:   Please see the history of present illness.    ROS All other systems reviewed and are negative.   PHYSICAL EXAM:   VS:  BP 137/66   Pulse 70   Ht 6' (1.829 m)   Wt (!) 375 lb 12.8 oz (170.5 kg)   BMI 50.97 kg/m    GEN: Well nourished, well developed, in no acute distress  HEENT: normal  Neck: no JVD, carotid bruits, or masses Cardiac: irregular; no murmurs, rubs, or gallops. BLE has hard woody appears, some redness, several open wound from picking, all draining clear fluid.  Respiratory:  clear to auscultation bilaterally, normal work of breathing GI: soft, nontender, nondistended, + BS MS: no deformity or atrophy  Skin: warm and dry, no rash Neuro:  Alert and Oriented x 3, Strength and sensation are intact Psych: euthymic mood, full affect  Wt Readings from Last 3 Encounters:  08/02/16 (!) 375 lb 12.8 oz (170.5 kg)  07/21/16 (!) 361 lb 12.8 oz (164.1 kg)  06/28/16 (!) 380 lb (172.4 kg)      Studies/Labs Reviewed:   EKG:  EKG is not ordered today.   Recent Labs: 06/14/2016: B Natriuretic Peptide 101.8; TSH 0.687 06/17/2016: Hemoglobin 10.9; Platelets 374 07/21/2016: ALT 36; BUN 39; Creat 1.75; Potassium 2.5; Sodium 124   Lipid Panel    Component Value Date/Time   CHOL 127 03/03/2016 1431   TRIG 170.0 (H) 03/03/2016 1431   HDL 28.10 (L) 03/03/2016 1431   CHOLHDL 5 03/03/2016 1431   VLDL 34.0 03/03/2016 1431    LDLCALC 65 03/03/2016 1431   LDLDIRECT 82.1 08/31/2011 0954    Additional studies/ records that were reviewed today include:   Cath 01/31/2014 Left mainstem:The left main arises from the left coronary cusp. The vessel is patent and it divides into the left circumflex, LAD, and intermediate branch.  Left anterior descending (LAD):The LAD is patent to the apex. The vessel has diffuse stenosis in its midportion of approximately 40-50%. There is no high-grade disease. The diagonal branches are patent.  Left circumflex (LCx):The left circumflex is patent with a large obtuse marginal branch. The intermediate branch divides into twin vessels and it is also widely patent. The mid AV circumflex just beyond the OM branch has 50% stenosis. The PLA branch is patent.  Right coronary artery (RCA):The RCA is dominant. The stented segment in the proximal vessel is widely patent. The mid vessel has 30% stenosis his PDA and PLA branches are patent.  Left ventriculography: Left ventricular systolic function is vigorous, LVEF is estimated at 65%, there is no significant mitral regurgitation   Final Conclusions:  1. Elevated right and left heart filling pressures 2. Mild to moderate nonobstructive coronary artery disease of the LAD and left circumflex with a widely patent RCA 3. Normal LV systolic function  Echo 06/11/2016 LV EF: 60% - 65%  - Left ventricle: The cavity size was normal. There was moderate concentric hypertrophy. Systolic function was normal. The estimated ejection fraction was in the range of 60% to 65%. Wall motion was normal; there were no regional wall motion abnormalities. - Aortic valve: Trileaflet; moderately thickened, moderately calcified leaflets. - Mitral valve: Calcified annulus. There was trivial regurgitation. - Left atrium: The atrium was moderately dilated. Volume/bsa, ES (1-plane Simpson&'s, A4C): 47.5 ml/m^2.  Impressions:  - Compared  to the prior study, there has been no significant interval change.    ASSESSMENT:    1. Acute on chronic diastolic heart failure (HCC)   2. Morbid obesity (HCC)   3. CKD (chronic kidney disease), stage III   4. Coronary artery disease involving native coronary artery of native heart without angina pectoris   5. Essential hypertension   6. Hyperlipidemia, unspecified hyperlipidemia type   7. Chronic atrial fibrillation (HCC)   8. 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 Hypokalemia      PLAN:  In order of problems listed above:  1. Acute on chronic diastolic heart failure: He has gained roughly 10 pounds since I last saw him. I did refer him to heart failure clinic during the last visit, however patient has not been to the heart failure clinic yet. He is still on torsemide 80 mg twice a day along with metolazone 3 times weekly. However according to the patient, he is actually taking metolazone on a daily basis for the entire past week due to worsening swelling. His lower extremity has hard woody appearance despite that he has several open wound from picking that's draining clear fluid. His morbid obesity continue to contribute to his volume overload. Unfortunately his volume status is very difficult to assess on physical exam. He likely will have a volume issue over and over again. I think he needs to be assessed by heart failure clinic at least once to guide further direction of diuresis. Despite his recent weight gain and volume overload, his kidney function deteriorated with aggressive diuresis. I did wish to obtain a BMET today, however he wished to delay this until his follow-up visit as he does not think he can walk very far. His long term prognosis is quite poor.  2. Severe hypokalemia: Noted on pacemaker metabolic panel 07/21/2016, apparently he ran out of potassium supplement for several days and continued diuretic without any potassium supplement. His PCP  has refilled the medication. Again recommended BMET today, however he wished to delay this as his morbid obesity and back pain are preventing him from getting to the lab downstairs.  3. Morbid obesity: I again discussed with him that unless he loses weight, his symptom will recur again.  4. CKD III: His renal function worsened slightly, baseline creatinine 1.4-1.5, recently his creatinine went up to 1.7. He has actually been taking daily dose of metolazone in an effort to get rid of excess fluid. I have recommended him to go back to 3 times weekly dose of metolazone until further assessment by heart failure clinic inferior of worsening renal function.  5. CAD: No obstructive CAD in 2015. No recent angina. Last echocardiogram in December when he 30 showed normal EF  6. Hyperlipidemia on Lipitor 80 mg daily  7. Atrial fibrillation: Well rate controlled, on Xarelto for stroke prevention.  8. DM 2: Poorly controlled, his glucose was in the 400-500 range, he says in the past 2 weeks, he  has been controlling his diabetes better with glucose less than 200.    Medication Adjustments/Labs and Tests Ordered: Current medicines are reviewed at length with the patient today.  Concerns regarding medicines are outlined above.  Medication changes, Labs and Tests ordered today are listed in the Patient Instructions below. Patient Instructions  Medication Instructions:  DECREASE YOUR METOLAZONE TO 3 TIMES A WEEK  Labwork: NONE   Testing/Procedures: NONE  Follow-Up: Your physician recommends that you schedule a follow-up appointment in: 2-3 MONTHS WITH DR Du[place]ne DR Haroldine Laws  Any Other Special Instructions Will Be Listed Below (If Applicable). WORK ON WEIGHT LOSS   If you need a refill on your cardiac medications before your next appointment, please call your pharmacy.     Hilbert Corrigan, [place]  08/02/2016 8:27 PM    [facility]  [facility] [address], [place], [place]  [address] Phone: [phone]; Fax: [phone]

## 2016-08-05 ENCOUNTER — Telehealth: Payer: Self-pay

## 2016-08-05 ENCOUNTER — Other Ambulatory Visit: Payer: Self-pay | Admitting: Endocrinology

## 2016-08-05 ENCOUNTER — Encounter (HOSPITAL_COMMUNITY): Payer: Medicare Other | Admitting: Internal Medicine

## 2016-08-05 NOTE — Telephone Encounter (Signed)
This Case Manager placed call to patient to remind him of his appointment on 08/06/16 at 1400 with Dr. Venetia Night. Patient aware of appointment and indicated he called to arrange Medicaid transportation. Reminded patient to bring all medications to his appointment as well as blood glucose log for Dr. Venetia Night to review. Patient indicated he did not have a blood glucose log but confirmed his blood glucose readings are stored in his glucometer. Instructed patient to bring his glucometer to his upcoming appointment, and patient verbalized understanding.

## 2016-08-06 ENCOUNTER — Ambulatory Visit: Payer: Medicare Other | Attending: Family Medicine | Admitting: Family Medicine

## 2016-08-06 ENCOUNTER — Encounter: Payer: Self-pay | Admitting: Family Medicine

## 2016-08-06 VITALS — BP 145/76 | HR 98 | Temp 97.7°F | Ht 72.0 in | Wt 356.8 lb

## 2016-08-06 DIAGNOSIS — R531 Weakness: Secondary | ICD-10-CM | POA: Diagnosis not present

## 2016-08-06 DIAGNOSIS — I878 Other specified disorders of veins: Secondary | ICD-10-CM

## 2016-08-06 DIAGNOSIS — I5032 Chronic diastolic (congestive) heart failure: Secondary | ICD-10-CM

## 2016-08-06 DIAGNOSIS — E1140 Type 2 diabetes mellitus with diabetic neuropathy, unspecified: Secondary | ICD-10-CM | POA: Diagnosis not present

## 2016-08-06 DIAGNOSIS — G4733 Obstructive sleep apnea (adult) (pediatric): Secondary | ICD-10-CM | POA: Diagnosis not present

## 2016-08-06 DIAGNOSIS — I130 Hypertensive heart and chronic kidney disease with heart failure and stage 1 through stage 4 chronic kidney disease, or unspecified chronic kidney disease: Secondary | ICD-10-CM | POA: Diagnosis not present

## 2016-08-06 DIAGNOSIS — N183 Chronic kidney disease, stage 3 unspecified: Secondary | ICD-10-CM

## 2016-08-06 DIAGNOSIS — Z9181 History of falling: Secondary | ICD-10-CM

## 2016-08-06 DIAGNOSIS — I480 Paroxysmal atrial fibrillation: Secondary | ICD-10-CM | POA: Insufficient documentation

## 2016-08-06 DIAGNOSIS — I872 Venous insufficiency (chronic) (peripheral): Secondary | ICD-10-CM | POA: Diagnosis not present

## 2016-08-06 DIAGNOSIS — Z7901 Long term (current) use of anticoagulants: Secondary | ICD-10-CM | POA: Insufficient documentation

## 2016-08-06 DIAGNOSIS — E1165 Type 2 diabetes mellitus with hyperglycemia: Secondary | ICD-10-CM | POA: Diagnosis not present

## 2016-08-06 DIAGNOSIS — E876 Hypokalemia: Secondary | ICD-10-CM

## 2016-08-06 DIAGNOSIS — I4891 Unspecified atrial fibrillation: Secondary | ICD-10-CM

## 2016-08-06 DIAGNOSIS — D509 Iron deficiency anemia, unspecified: Secondary | ICD-10-CM | POA: Diagnosis not present

## 2016-08-06 DIAGNOSIS — E1122 Type 2 diabetes mellitus with diabetic chronic kidney disease: Secondary | ICD-10-CM | POA: Insufficient documentation

## 2016-08-06 DIAGNOSIS — E11621 Type 2 diabetes mellitus with foot ulcer: Secondary | ICD-10-CM | POA: Insufficient documentation

## 2016-08-06 DIAGNOSIS — I5033 Acute on chronic diastolic (congestive) heart failure: Secondary | ICD-10-CM | POA: Insufficient documentation

## 2016-08-06 DIAGNOSIS — E781 Pure hyperglyceridemia: Secondary | ICD-10-CM | POA: Insufficient documentation

## 2016-08-06 DIAGNOSIS — E118 Type 2 diabetes mellitus with unspecified complications: Secondary | ICD-10-CM

## 2016-08-06 DIAGNOSIS — I2510 Atherosclerotic heart disease of native coronary artery without angina pectoris: Secondary | ICD-10-CM | POA: Insufficient documentation

## 2016-08-06 DIAGNOSIS — E7800 Pure hypercholesterolemia, unspecified: Secondary | ICD-10-CM | POA: Diagnosis not present

## 2016-08-06 DIAGNOSIS — IMO0002 Reserved for concepts with insufficient information to code with codable children: Secondary | ICD-10-CM

## 2016-08-06 DIAGNOSIS — Z9889 Other specified postprocedural states: Secondary | ICD-10-CM | POA: Insufficient documentation

## 2016-08-06 LAB — GLUCOSE, POCT (MANUAL RESULT ENTRY): POC GLUCOSE: 146 mg/dL — AB (ref 70–99)

## 2016-08-06 MED ORDER — POTASSIUM CHLORIDE CRYS ER 15 MEQ PO TBCR: 30.0000 meq | EXTENDED_RELEASE_TABLET | Freq: Two times a day (BID) | ORAL | 1 refills | Status: DC

## 2016-08-06 NOTE — Progress Notes (Signed)
Subjective:    Patient ID: [name], male    DOB: 22-Nov-1950, 66 y.o.   MRN: [id number]  HPI [name] is a 66 year old male with a history of morbid obesity, type 2 diabetes mellitus (A1c 8.1 from 05/2016 ), hypertension,OSA (unable to tolerate CPAP), intermittent oxygen use, CAD (status post PCI in 2012), chronic diastolic heart failure (EF 60-65% from 2-D echo 06/2016), atrial fibrillation (s/p cardioversion in the past) who presents today for a follow-up visit.  He was admitted for acute on chronic diastolic heart failure last month with worsening dyspnea and lower extremity edema with lower extremity weeping.Was diuresed with IV Lasix with resulting weight loss of 19 pounds; this was later switched to torsemide on which he was discharged. 2-D echo (from 06/2016): EF 60-65%, no regional wall motion abnormalities 2-D echo (from 11/2014): EF 50-55%, no regional wall motion abnormalities LHC &RHC (from 01/2014):Stenosis of 40-50% mid LAD, 50% circumflex and 30% RCA lesions; LVEF 65%  He had complained of bilateral lower extremity weakness and was found to be hypokalemic with a potassium of 2.5 at his last visit which was due to running out of potassium tablets which he has since resumed and reports resolution of symptoms . He does occasionally have twitching of his hands and legs. He denies chest pains or shortness of breath at this time. Last seen by cardiology 4 days ago.  Past Medical History:  Diagnosis Date  . Arthritis    "hands and lower back" (09/19/2014)  . CAD (coronary artery disease)    a. s/p PCI to RCA in 2012. b. prior cath in 01/2014 with elevated L/RH pressures, mild-mod CAD of LAD/LCx with patent RCA, normal EF, c/b CIN/CHF.  [name] Carpal tunnel syndrome, bilateral   . Cellulitis   . Chronic diastolic CHF (congestive heart failure) (HCC)   . Chronic kidney disease (CKD), stage III (moderate)   . Chronic lower back pain   . History of blood transfusion ~ 1954   "related to OR"  . Hyperlipidemia   . Hypertension   . Hypoxia    a. Qualified for home O2 at [place] in 09/2014.  [name] Lower GI bleed   . Microcytic anemia   . Morbid obesity (HCC)   . Neuropathy (HCC)   . OSA (obstructive sleep apnea)    "I wear nasal prongs; haven't been using prongs recently" (09/19/2014)  . PAF (paroxysmal atrial fibrillation) (HCC)    TEE DCCV 09/23/2014  . Physical deconditioning   . Pilonidal cyst 1980's; 01/25/2013  . Scrotal abscess   . Type II diabetes mellitus (HCC)     Past Surgical History:  Procedure Laterality Date  . ABDOMINAL SURGERY  ~ 1954   BENIGN TUMOR REMOVED  . APPENDECTOMY    . CARDIOVERSION  2010   [name] 09/19/2014  . CARDIOVERSION N/A 09/23/2014   Procedure: CARDIOVERSION;  Surgeon: Lewayne Bunting, MD;  Location: [facility] ENDOSCOPY;  Service: Cardiovascular;  Laterality: N/A;  . CARDIOVERSION N/A 12/11/2014   Procedure: CARDIOVERSION;  Surgeon: Thurmon Fair, MD;  Location: [facility] ENDOSCOPY;  Service: Cardiovascular;  Laterality: N/A;  . CATARACT EXTRACTION W/PHACO Right 11/15/2012   Procedure: CATARACT EXTRACTION PHACO AND INTRAOCULAR LENS PLACEMENT (IOC);  Surgeon: Shade Flood, MD;  Location: [facility] OR;  Service: Ophthalmology;  Laterality: Right;  . CATARACT EXTRACTION W/PHACO Left 11/29/2012   Procedure: CATARACT EXTRACTION PHACO AND INTRAOCULAR LENS PLACEMENT (IOC);  Surgeon: Shade Flood, MD;  Location: [facility] OR;  Service: Ophthalmology;  Laterality: Left;  . CORONARY  ANGIOPLASTY WITH STENT PLACEMENT  August 2012   RCA DES - [facility]  . DEBRIDEMENT  FOOT Left    debriding diabetic foot ulcers  . EYE SURGERY    . FOREIGN BODY REMOVAL Right 2014   heel,  splinter removal   . LEFT AND RIGHT HEART CATHETERIZATION WITH CORONARY ANGIOGRAM N/A 01/31/2014   Procedure: LEFT AND RIGHT HEART CATHETERIZATION WITH CORONARY ANGIOGRAM;  Surgeon: Micheline Chapman, MD;  Location: [facility] CATH LAB;  Service: Cardiovascular;  Laterality: N/A;  .  PARS PLANA VITRECTOMY Left 06/05/2013   Procedure: PARS PLANA VITRECTOMY WITH 23 GAUGE with Endolaser(constellation);  Surgeon: Shade Flood, MD;  Location: [facility] OR;  Service: Ophthalmology;  Laterality: Left;  . PILONIDAL CYST EXCISION N/A 01/08/2013   Procedure: CYST EXCISION PILONIDAL EXTENSIVE;  Surgeon: Axel Filler, MD;  Location: [facility] OR;  Service: General;  Laterality: N/A;  . PILONIDAL CYST EXCISION  1980's   "in [place]"  . TEE WITHOUT CARDIOVERSION N/A 09/23/2014   Procedure: TRANSESOPHAGEAL ECHOCARDIOGRAM (TEE);  Surgeon: Lewayne Bunting, MD;  Location: [facility] ENDOSCOPY;  Service: Cardiovascular;  Laterality: N/A;  . TONSILLECTOMY      No Known Allergies   Review of Systems Constitutional: Negative for activity change and appetite change.  HENT: Negative for sinus pressure and sore throat.   Eyes: Negative for visual disturbance.  Respiratory: Positive for shortness of breath which is at baseline. Negative for cough and chest tightness.   Cardiovascular: Negative for leg swelling Negative for chest pain.  Gastrointestinal: Negative for abdominal distention, abdominal pain, constipation and diarrhea.  Endocrine: Negative.   Genitourinary: Negative for dysuria.  Musculoskeletal: Negative for joint swelling and myalgias.  Skin: Negative for rash.  Allergic/Immunologic: Negative.   Neurological: Negative for weakness, light-headedness and numbness.  Psychiatric/Behavioral: Negative for dysphoric mood and suicidal ideas.        Objective: Vitals:   08/06/16 1355  BP: (!) 145/76  Pulse: 98  Temp: 97.7 F (36.5 C)  TempSrc: Oral  SpO2: 97%  Weight: (!) 356 lb 12.8 oz (161.8 kg)  Height: 6' (1.829 m)    Wt Readings from Last 3 Encounters:  08/06/16 (!) 356 lb 12.8 oz (161.8 kg)  08/02/16 (!) 375 lb 12.8 oz (170.5 kg)  07/21/16 (!) 361 lb 12.8 oz (164.1 kg)      Physical Exam Constitutional: He is oriented to person, place, and time. He appears well-developed and  well-nourished.  Morbidly obese  Neck: No JVD present.  Cardiovascular: normal rate, normal heart sounds and intact distal pulses.  An irregularly irregular rhythm present.  No murmur heard. Pulmonary/Chest: Effort normal and breath sounds normal. He has no wheezes. He has no rales. He exhibits no tenderness.  Abdominal: Soft. Bowel sounds are normal. He exhibits no distension and no mass. There is no tenderness.  Musculoskeletal: Normal range of motion. He exhibits no edema  Neurological: He is alert and oriented to person, place, and time.  Skin: There is mild erythema (erythema of bilateral lower extremities; rash in keeping with stasis dermatitis), weeping with clear drainage Psychiatric: He has a normal mood and affect.        CMP Latest Ref Rng & Units 07/21/2016 06/25/2016 06/17/2016  Glucose 65 - 99 mg/dL 443(X) 540(G) 867(Y)  BUN 7 - 25 mg/dL 19(J) 23 09(T)  Creatinine 0.70 - 1.25 mg/dL 2.67(T) 2.45(Y) 0.99(I)  Sodium 135 - 146 mmol/L 124(L) 134(L) 136  Potassium 3.5 - 5.3 mmol/L 2.5(LL) 4.0 4.0  Chloride 98 -  110 mmol/L <80(L) 94(L) 92(L)  CO2 20 - 31 mmol/L 32(H) 26 32  Calcium 8.6 - 10.3 mg/dL 8.7 3.8(V) 9.2  Total Protein 6.1 - 8.1 g/dL 7.2 6.8 7.6  Total Bilirubin 0.2 - 1.2 mg/dL 0.6 0.5 0.6  Alkaline Phos 40 - 115 U/L 88 81 78  AST 10 - 35 U/L 47(H) 23 18  ALT 9 - 46 U/L 36 20 16(L)    Assessment & Plan:  1. Uncontrolled type 2 diabetes mellitus with complication, without long-term current use of insulin (HCC) Uncontrolled with A1c of 8.1 Currently on Lantus 50 units twice daily  Continue Humulin R which was previously prescribed Keep appointment with endocrinologist   2. Pure hypercholesterolemia Mild hypertriglyceridemia Continue statin  3. CKD (chronic kidney disease) stage 3, GFR 30-59 ml/min We'll check renal function and potassium Avoid nephrotoxins - COMPLETE METABOLIC PANEL WITH GFR  4. Chronic diastolic congestive heart failure (HCC) EF 60-65%  from 2-D echo of 06/2016 Euvolemic Continue torsemide, metolazone  Restrict fluid intake to less than 2L per day, low-sodium diet, daily weight checks  5. Atrial fibrillation with RVR (HCC) Continue anticoagulation with Xarelto and rate control with metoprolol  6. Venous stasis dermatitis of both lower extremities Mild weeping of scabs - ?trauma Lower Extremities Cleaned and Topical Antibiotics Applied in the Clinic. Advised to elevate feet and avoid dependent position as much as possible  7. High risk for falls He has had no hypoglycemia or hypotension to explain falls Due to morbid obesity, diabetic neuropathy  8. Hypokalemia Last potassium was 2.5 due to his running out of potassium pills He has been compliant with potassium chloride tablets We will repeat potassium level today

## 2016-08-06 NOTE — Progress Notes (Signed)
Med refills-torsemide, spironolactone, lancets, potassium chloride

## 2016-08-07 LAB — COMPLETE METABOLIC PANEL WITH GFR
ALT: 17 U/L (ref 9–46)
AST: 20 U/L (ref 10–35)
Albumin: 3.5 g/dL — ABNORMAL LOW (ref 3.6–5.1)
Alkaline Phosphatase: 73 U/L (ref 40–115)
BILIRUBIN TOTAL: 0.8 mg/dL (ref 0.2–1.2)
BUN: 33 mg/dL — ABNORMAL HIGH (ref 7–25)
CHLORIDE: 84 mmol/L — AB (ref 98–110)
CO2: 36 mmol/L — AB (ref 20–31)
Calcium: 8.7 mg/dL (ref 8.6–10.3)
Creat: 1.57 mg/dL — ABNORMAL HIGH (ref 0.70–1.25)
GFR, EST AFRICAN AMERICAN: 53 mL/min — AB (ref >=60)
GFR, EST NON AFRICAN AMERICAN: 46 mL/min — AB (ref >=60)
GLUCOSE: 115 mg/dL — AB (ref 65–99)
POTASSIUM: 3.2 mmol/L — AB (ref 3.5–5.3)
SODIUM: 135 mmol/L (ref 135–146)
TOTAL PROTEIN: 7 g/dL (ref 6.1–8.1)

## 2016-08-09 ENCOUNTER — Other Ambulatory Visit: Payer: Self-pay | Admitting: Family Medicine

## 2016-08-09 MED ORDER — POTASSIUM CHLORIDE CRYS ER 20 MEQ PO TBCR: 40.0000 meq | EXTENDED_RELEASE_TABLET | Freq: Two times a day (BID) | ORAL | 2 refills | Status: DC

## 2016-08-19 NOTE — Telephone Encounter (Signed)
Refill of one touch lancets and the BD INSULIN SYRINGE ULTRAFINE 31G X 15/64" 0.3 ML MISC  [id number] [place], [place] - [address] [phone] (Phone) [phone] (Fax)

## 2016-08-20 ENCOUNTER — Other Ambulatory Visit (INDEPENDENT_AMBULATORY_CARE_PROVIDER_SITE_OTHER): Payer: Medicare Other

## 2016-08-20 ENCOUNTER — Other Ambulatory Visit: Payer: Self-pay

## 2016-08-20 DIAGNOSIS — Z794 Long term (current) use of insulin: Secondary | ICD-10-CM | POA: Diagnosis not present

## 2016-08-20 DIAGNOSIS — E1165 Type 2 diabetes mellitus with hyperglycemia: Secondary | ICD-10-CM | POA: Diagnosis not present

## 2016-08-20 LAB — BASIC METABOLIC PANEL
BUN: 47 mg/dL — AB (ref 6–23)
CHLORIDE: 86 meq/L — AB (ref 96–112)
CO2: 32 meq/L (ref 19–32)
CREATININE: 1.89 mg/dL — AB (ref 0.40–1.50)
Calcium: 9.1 mg/dL (ref 8.4–10.5)
GFR: 38.25 mL/min — ABNORMAL LOW (ref >60.00)
GLUCOSE: 145 mg/dL — AB (ref 70–99)
Potassium: 3.2 mEq/L — ABNORMAL LOW (ref 3.5–5.1)
Sodium: 133 mEq/L — ABNORMAL LOW (ref 135–145)

## 2016-08-20 LAB — HEMOGLOBIN A1C: Hgb A1c MFr Bld: 8.1 % — ABNORMAL HIGH (ref 4.6–6.5)

## 2016-08-20 LAB — COMPREHENSIVE METABOLIC PANEL
ALBUMIN: 3.6 g/dL (ref 3.5–5.2)
ALK PHOS: 80 U/L (ref 39–117)
ALT: 21 U/L (ref 0–53)
AST: 26 U/L (ref 0–37)
BILIRUBIN TOTAL: 0.5 mg/dL (ref 0.2–1.2)
BUN: 47 mg/dL — AB (ref 6–23)
CO2: 32 mEq/L (ref 19–32)
Calcium: 9.1 mg/dL (ref 8.4–10.5)
Chloride: 86 mEq/L — ABNORMAL LOW (ref 96–112)
Creatinine, Ser: 1.89 mg/dL — ABNORMAL HIGH (ref 0.40–1.50)
GFR: 38.25 mL/min — AB (ref >60.00)
Glucose, Bld: 145 mg/dL — ABNORMAL HIGH (ref 70–99)
POTASSIUM: 3.2 meq/L — AB (ref 3.5–5.1)
Sodium: 133 mEq/L — ABNORMAL LOW (ref 135–145)
TOTAL PROTEIN: 7.9 g/dL (ref 6.0–8.3)

## 2016-08-20 MED ORDER — "BD INSULIN SYRINGE ULTRAFINE 31G X 15/64"" 0.3 ML MISC": 5 refills | Status: DC

## 2016-08-20 MED ORDER — ONETOUCH ULTRASOFT LANCETS MISC: 3 refills | Status: DC

## 2016-08-20 NOTE — Telephone Encounter (Signed)
Ordered

## 2016-08-21 LAB — FRUCTOSAMINE: Fructosamine: 233 umol/L (ref 0–285)

## 2016-08-23 ENCOUNTER — Other Ambulatory Visit: Payer: Self-pay

## 2016-08-23 MED ORDER — ONETOUCH ULTRASOFT LANCETS MISC: 3 refills | Status: DC

## 2016-08-24 ENCOUNTER — Encounter: Payer: Self-pay | Admitting: Endocrinology

## 2016-08-24 ENCOUNTER — Other Ambulatory Visit: Payer: Self-pay

## 2016-08-24 ENCOUNTER — Ambulatory Visit (INDEPENDENT_AMBULATORY_CARE_PROVIDER_SITE_OTHER): Payer: Medicare Other | Admitting: Endocrinology

## 2016-08-24 VITALS — BP 102/62 | HR 92 | Ht 72.0 in | Wt 368.0 lb

## 2016-08-24 DIAGNOSIS — Z794 Long term (current) use of insulin: Secondary | ICD-10-CM | POA: Diagnosis not present

## 2016-08-24 DIAGNOSIS — E1165 Type 2 diabetes mellitus with hyperglycemia: Secondary | ICD-10-CM

## 2016-08-24 MED ORDER — INSULIN PEN NEEDLE 31G X 5 MM MISC: 4 refills | Status: DC

## 2016-08-24 MED ORDER — ACCU-CHEK AVIVA PLUS VI STRP: ORAL_STRIP | 2 refills | Status: DC

## 2016-08-24 NOTE — Patient Instructions (Addendum)
Tresiba 80 daily and Humalog BEFORE eating  Full tab of Spironolactone

## 2016-08-24 NOTE — Progress Notes (Signed)
Patient ID: [name], male   DOB: 15-Nov-1950, 66 y.o.   MRN: [id number]            Reason for Appointment:  Follow-up for Type 2 Diabetes    History of Present Illness:          Date of diagnosis of type 2 diabetes mellitus: 25       Background history:   Initially his diabetes was mild and was able to control it with metformin and also Actos. He was also able to exercise and try to lose weight However about 2 years after diagnosis he was started on insulin because of poor control, details not available. His metformin was stopped possibly because of kidney problems Also not clear what his level of control was in the last few years with taking various insulin regimens including Lantus and NovoLog He thinks he was tried on Trulicity for 3-4 months and it did not help her sugars or his weight Because of needing large doses of NovoLog his previous endocrinologist switched him to U-500 Regular Insulin with a syringe   However he thinks that he had blood sugars around 7% at some point with NovoLog and Lantus  Recent history:   INSULIN regimen is:  Lantus 50 bid with Humalog U-200 insulin 100 units 3-4x daily  Non-insulin hypoglycemic drugs the patient is taking are: none  He is again having a high A1c of 8.1% Has not been seen in follow-up since 04/2016  Current management, blood sugar patterns and problems identified:  He was doing reasonably well with Toujeo and Humalog on his last visit but he was changed during his hospitalization last year 2 Lantus twice a day instead of Toujeo  He has had blood sugars only on his new meter for the last 5 days  Blood sugars are quite erratic throughout the day  He now says that he is taking HUMALOG randomly, mostly after eating when he checks his blood sugar and will take extra insulin if the blood sugar has not come down  He has somewhat better readings fasting blood not consistently and has high readings at all different  times  Today after his first meal is blood sugar was about 500 by missing his mealtime insulin before eating  No recent hypoglycemia with low-dose blood sugar 75 He was supposed to see the dietitian but has had difficulty with transportation previously  Side effects from medications have been: none  Compliance with the medical regimen:  fair Hypoglycemia:  occasionally in am  Glucose monitoring:  done 1+  times a day         Glucometer:  Accucheck Aviva     Blood Glucose readings by download as   Mean values apply above for all meters except median for One Touch  PRE-MEAL Fasting Lunch Dinner Bedtime Overall  Glucose range: 75-276 161-331  125-500  77-346   Mean/median:     234     Self-care: The diet that the patient has been following is: none,  drinks  Regular colas  At times Meal times are: variable, Bfst 7-8; lunch variable; dinner 8-9 pm Typical meal intake: Breakfast is eggs or cheese              Dietician visit, most recent:  Years ago               Exercise:  none-not able to do much  Weight history:   Wt Readings from Last 3 Encounters:  08/24/16 [name])  368 lb (166.9 kg)  08/06/16 (!) 356 lb 12.8 oz (161.8 kg)  08/02/16 (!) 375 lb 12.8 oz (170.5 kg)    Glycemic control:last A1c 8.9   Lab Results  Component Value Date   HGBA1C 8.1 (H) 08/20/2016   HGBA1C 8.1 (H) 06/10/2016   HGBA1C 8.9 03/03/2016   Lab Results  Component Value Date   MICROALBUR 0.5 03/09/2016   LDLCALC 65 03/03/2016   CREATININE 1.89 (H) 08/20/2016   CREATININE 1.89 (H) 08/20/2016   Lab Results  Component Value Date   MICRALBCREAT 13 03/09/2016       Allergies as of 08/24/2016   No Known Allergies     Medication List       Accurate as of 08/24/16  8:55 PM. Always use your most recent med list.          ACCU-CHEK AVIVA PLUS test strip Generic drug:  glucose blood USE AS INSTRUCTED TO TEST BLOOD SUGAR 6 TIMES DAILY BEFORE MEALS.   ACCU-CHEK AVIVA PLUS w/Device Kit USE  TO CHECK BLOOD SUGAR 6 TIMES PER DAY DX CODE E11.65   aspirin 81 MG EC tablet Take 1 tablet (81 mg total) by mouth daily.   atorvastatin 80 MG tablet Commonly known as:  LIPITOR Take 1 tablet (80 mg total) by mouth daily.   BD INSULIN SYRINGE ULTRAFINE 31G X 15/64" 0.3 ML Misc Generic drug:  Insulin Syringe-Needle U-100 USE TO IJNECT INSULIN 3 TIMES DAILY.   gabapentin 300 MG capsule Commonly known as:  NEURONTIN Take 2 capsules (600 mg total) by mouth 2 (two) times daily.   HUMALOG KWIKPEN 200 UNIT/ML Sopn Generic drug:  Insulin Lispro INJECT 80 TO 100 UNITS 3 TIMES A DAY DEPENDING ON MEAL SIZE   HUMALOG KWIKPEN 200 UNIT/ML Sopn Generic drug:  Insulin Lispro INJECT 80 TO 100 UNITS 3 TIMES A DAY DEPENDING ON MEAL SIZE   hydrocerin Crea Apply 1 application topically 3 (three) times daily. legs   insulin glargine 100 UNIT/ML injection Commonly known as:  LANTUS Inject 0.5 mLs (50 Units total) into the skin 2 (two) times daily.   Insulin Pen Needle 31G X 5 MM Misc Use to inject insulin 4 times daily   metolazone 5 MG tablet Commonly known as:  ZAROXOLYN Take 1 tablet (5 mg total) by mouth 3 (three) times a week. Mondays, Wednesdays, and Fridays   metoprolol 50 MG tablet Commonly known as:  LOPRESSOR Take 50 mg by mouth 2 (two) times daily. TWO TABLETS TWICE A DAY   multivitamin with minerals Tabs tablet Take 1 tablet by mouth daily.   mupirocin cream 2 % Commonly known as:  BACTROBAN Apply topically 2 (two) times daily.   NATURAL BALANCE TEARS OP Place 1 drop into both eyes daily as needed (for dry eyes).   onetouch ultrasoft lancets Use to test blood sugar 6 times daily Dx code E11.65   Oxycodone HCl 20 MG Tabs Take 1 tablet (20 mg total) by mouth every 6 (six) hours as needed for severe pain.   potassium chloride SA 20 MEQ tablet Commonly known as:  KLOR-CON M15 Take 2 tablets (40 mEq total) by mouth 2 (two) times daily.   rivaroxaban 20 MG Tabs  tablet Commonly known as:  XARELTO Take 1 tablet (20 mg total) by mouth daily with supper.   spironolactone 25 MG tablet Commonly known as:  ALDACTONE Take 0.5 tablets (12.5 mg total) by mouth daily.   torsemide 20 MG tablet Commonly known as:  DEMADEX  Take 4 tablets (80 mg total) by mouth 2 (two) times daily.       Allergies: No Known Allergies  Past Medical History:  Diagnosis Date  . Arthritis    "hands and lower back" (09/19/2014)  . CAD (coronary artery disease)    a. s/p PCI to RCA in 2012. b. prior cath in 01/2014 with elevated L/RH pressures, mild-mod CAD of LAD/LCx with patent RCA, normal EF, c/b CIN/CHF.  [name] Carpal tunnel syndrome, bilateral   . Cellulitis   . Chronic diastolic CHF (congestive heart failure) ([place])   . Chronic kidney disease (CKD), stage III (moderate)   . Chronic lower back pain   . History of blood transfusion ~ 1954   "related to OR"  . Hyperlipidemia   . Hypertension   . Hypoxia    a. Qualified for home O2 at [place] in 09/2014.  [name] Lower GI bleed   . Microcytic anemia   . Morbid obesity ([place])   . Neuropathy ([place])   . OSA (obstructive sleep apnea)    "I wear nasal prongs; haven't been using prongs recently" (09/19/2014)  . PAF (paroxysmal atrial fibrillation) ([place])    TEE DCCV 09/23/2014  . Physical deconditioning   . Pilonidal cyst 1980's; 01/25/2013  . Scrotal abscess   . Type II diabetes mellitus ([place])     Past Surgical History:  Procedure Laterality Date  . ABDOMINAL SURGERY  ~ 1954   BENIGN TUMOR REMOVED  . APPENDECTOMY    . CARDIOVERSION  2010   Archie Endo 09/19/2014  . CARDIOVERSION N/A 09/23/2014   Procedure: CARDIOVERSION;  Surgeon: Lelon Perla, MD;  Location: [facility] ENDOSCOPY;  Service: Cardiovascular;  Laterality: N/A;  . CARDIOVERSION N/A 12/11/2014   Procedure: CARDIOVERSION;  Surgeon: Sanda Klein, MD;  Location: [place] ENDOSCOPY;  Service: Cardiovascular;  Laterality: N/A;  . CATARACT EXTRACTION W/PHACO Right 11/15/2012   Procedure:  CATARACT EXTRACTION PHACO AND INTRAOCULAR LENS PLACEMENT ([place]);  Surgeon: Adonis Brook, MD;  Location: [place];  Service: Ophthalmology;  Laterality: Right;  . CATARACT EXTRACTION W/PHACO Left 11/29/2012   Procedure: CATARACT EXTRACTION PHACO AND INTRAOCULAR LENS PLACEMENT (IOC);  Surgeon: Adonis Brook, MD;  Location: [place];  Service: Ophthalmology;  Laterality: Left;  . CORONARY ANGIOPLASTY WITH STENT PLACEMENT  August 2012   RCA DES - [facility]  . DEBRIDEMENT  FOOT Left    debriding diabetic foot ulcers  . EYE SURGERY    . FOREIGN BODY REMOVAL Right 2014   heel,  splinter removal   . LEFT AND RIGHT HEART CATHETERIZATION WITH CORONARY ANGIOGRAM N/A 01/31/2014   Procedure: LEFT AND RIGHT HEART CATHETERIZATION WITH CORONARY ANGIOGRAM;  Surgeon: Blane Ohara, MD;  Location: [facility] CATH LAB;  Service: Cardiovascular;  Laterality: N/A;  . PARS PLANA VITRECTOMY Left 06/05/2013   Procedure: PARS PLANA VITRECTOMY WITH 23 GAUGE with Endolaser(constellation);  Surgeon: Adonis Brook, MD;  Location: [place];  Service: Ophthalmology;  Laterality: Left;  . PILONIDAL CYST EXCISION N/A 01/08/2013   Procedure: CYST EXCISION PILONIDAL EXTENSIVE;  Surgeon: Ralene Ok, MD;  Location: [place];  Service: General;  Laterality: N/A;  . PILONIDAL CYST EXCISION  1980's   "in [place]"  . TEE WITHOUT CARDIOVERSION N/A 09/23/2014   Procedure: TRANSESOPHAGEAL ECHOCARDIOGRAM (TEE);  Surgeon: Lelon Perla, MD;  Location: [facility] ENDOSCOPY;  Service: Cardiovascular;  Laterality: N/A;  . TONSILLECTOMY      Family History  Problem Relation Age of Onset  . Adopted: Yes  . Other Other  PT ADOPTED    Social History:  reports that he quit smoking about 33 years ago. His smoking use included Cigarettes. He has a 20.00 pack-year smoking history. He has never used smokeless tobacco. He reports that he does not drink alcohol or use drugs.   Review of Systems   Lipid history: On 80 mg Lipitor, has  history of CAD    Lab Results  Component Value Date   CHOL 127 03/03/2016   HDL 28.10 (L) 03/03/2016   LDLCALC 65 03/03/2016   LDLDIRECT 82.1 08/31/2011   TRIG 170.0 (H) 03/03/2016   CHOLHDL 5 03/03/2016           Hypertension: Present for several years, Not on any medications currently except metoprolol and diuretics  On Demadex 20 mg for history of CHF along with Aldactone 12.5 mg and Zaroxolyn   He appears to have persistent hypokalemia   Lab Results  Component Value Date   CREATININE 1.89 (H) 08/20/2016   CREATININE 1.89 (H) 08/20/2016   BUN 47 (H) 08/20/2016   BUN 47 (H) 08/20/2016   NA 133 (L) 08/20/2016   NA 133 (L) 08/20/2016   K 3.2 (L) 08/20/2016   K 3.2 (L) 08/20/2016   CL 86 (L) 08/20/2016   CL 86 (L) 08/20/2016   CO2 32 08/20/2016   CO2 32 08/20/2016      Most recent eye exam was In 04/2016  Most recent foot exam: 8/17  Lab Results  Component Value Date   TSH 0.687 06/14/2016     LABS:  Lab on 08/20/2016  Component Date Value Ref Range Status  . Sodium 08/20/2016 133* 135 - 145 mEq/L Final  . Potassium 08/20/2016 3.2* 3.5 - 5.1 mEq/L Final  . Chloride 08/20/2016 86* 96 - 112 mEq/L Final  . CO2 08/20/2016 32  19 - 32 mEq/L Final  . Glucose, Bld 08/20/2016 145* 70 - 99 mg/dL Final  . BUN 08/20/2016 47* 6 - 23 mg/dL Final  . Creatinine, Ser 08/20/2016 1.89* 0.40 - 1.50 mg/dL Final  . Calcium 08/20/2016 9.1  8.4 - 10.5 mg/dL Final  . GFR 08/20/2016 38.25* >60.00 mL/min Final  . Fructosamine 08/20/2016 233  0 - 285 umol/L Final   Comment: Published reference interval for apparently healthy subjects between age 49 and 79 is 65 - 285 umol/L and in a poorly controlled diabetic population is 228 - 563 umol/L with a mean of 396 umol/L.   [name] Hgb A1c MFr Bld 08/20/2016 8.1* 4.6 - 6.5 % Final  . Sodium 08/20/2016 133* 135 - 145 mEq/L Final  . Potassium 08/20/2016 3.2* 3.5 - 5.1 mEq/L Final  . Chloride 08/20/2016 86* 96 - 112 mEq/L Final  . CO2  08/20/2016 32  19 - 32 mEq/L Final  . Glucose, Bld 08/20/2016 145* 70 - 99 mg/dL Final  . BUN 08/20/2016 47* 6 - 23 mg/dL Final  . Creatinine, Ser 08/20/2016 1.89* 0.40 - 1.50 mg/dL Final  . Total Bilirubin 08/20/2016 0.5  0.2 - 1.2 mg/dL Final  . Alkaline Phosphatase 08/20/2016 80  39 - 117 U/L Final  . AST 08/20/2016 26  0 - 37 U/L Final  . ALT 08/20/2016 21  0 - 53 U/L Final  . Total Protein 08/20/2016 7.9  6.0 - 8.3 g/dL Final  . Albumin 08/20/2016 3.6  3.5 - 5.2 g/dL Final  . Calcium 08/20/2016 9.1  8.4 - 10.5 mg/dL Final  . GFR 08/20/2016 38.25* >60.00 mL/min Final    Physical Examination:  BP 102/62   Pulse 92   Ht 6' (1.829 m)   Wt (!) 368 lb (166.9 kg)   SpO2 94%   BMI 49.91 kg/m          ASSESSMENT:  Diabetes type 2, uncontrolled  See history of present illness for detailed discussion of current diabetes management, blood sugar patterns and problems identified  He is coming back for follow-up after several months Sugars are poorly controlled and quite erratic despite taking large doses of insulin He is taking Humalog somewhat inconsistently and often after meals and will take more insulin when his blood sugars are higher at different times which is causing alternating low normal and high readings at times  Does not have readings to look his blood sugar patterns recently and also fasting readings are not being checked consistently He has had inconsistent diet and needs more education for meal planning also Is significantly resistant to insulin because of his morbid obesity also Currently not able to take metformin because of renal function being abnormal  Recommendations:  Trial of the V-go pump using the U-500 insulin.  Showed him how the V-go pump works, benefits and use of the basal and bolus functions  He also will have better efficacy of insulin with using the U-500 Regular Insulin  Since it is unclear how much he may need we'll start with a 20 unit  basal  He will take boluses before eating, preferably 30 minutes before eating and can start with 4-5 clicks with each meal  He met are diabetes educator today and he will have the paperwork forwarded for insurance clearance  Minimally will go back to Toujeo 80 units daily instead of Lantus  Also he will need to start taking the Humalog right before eating instead of after  He should also be a good candidate for the freestyle Libre sensor especially as he is checking his blood sugars fairly frequently already but not at consistent times  Increase spironolactone to at least 25 mg and discuss hypokalemia with his other consultants   Reschedule consultation with dietitian  Patient Instructions  Tresiba 80 daily and Humalog BEFORE eating  Full tab of Spironolactone   Counseling time on subjects discussed above is over 50% of today's 25 minute visit   Bayle Calvo 08/24/2016, 8:55 PM   Note: This office note was prepared with Dragon voice recognition system technology. Any transcriptional errors that result from this process are unintentional.

## 2016-08-26 ENCOUNTER — Other Ambulatory Visit: Payer: Self-pay

## 2016-08-26 MED ORDER — ACCU-CHEK AVIVA PLUS VI STRP: ORAL_STRIP | 2 refills | Status: DC

## 2016-08-27 ENCOUNTER — Other Ambulatory Visit: Payer: Self-pay

## 2016-08-27 MED ORDER — ACCU-CHEK MULTICLIX LANCETS MISC: 5 refills | Status: DC

## 2016-08-31 ENCOUNTER — Encounter: Payer: Self-pay | Admitting: Podiatry

## 2016-08-31 ENCOUNTER — Other Ambulatory Visit: Payer: Self-pay

## 2016-08-31 ENCOUNTER — Ambulatory Visit (INDEPENDENT_AMBULATORY_CARE_PROVIDER_SITE_OTHER): Payer: Medicare Other | Admitting: Podiatry

## 2016-08-31 DIAGNOSIS — B351 Tinea unguium: Secondary | ICD-10-CM

## 2016-08-31 DIAGNOSIS — E1151 Type 2 diabetes mellitus with diabetic peripheral angiopathy without gangrene: Secondary | ICD-10-CM | POA: Diagnosis not present

## 2016-08-31 DIAGNOSIS — E0842 Diabetes mellitus due to underlying condition with diabetic polyneuropathy: Secondary | ICD-10-CM | POA: Diagnosis not present

## 2016-08-31 MED ORDER — ACCU-CHEK MULTICLIX LANCETS MISC: 5 refills | Status: DC

## 2016-08-31 NOTE — Patient Instructions (Signed)

## 2016-08-31 NOTE — Progress Notes (Signed)
Patient ID: [name], male   DOB: 1951/02/07, 66 y.o.   MRN: [id number]    Subjective: This patient presents today complaining that his toenails uncomfortable walking wearing shoes and request toenail debridement. Patient is diabetic and describes some history of cardiac failure and fluid retention. Patient has ongoing lower leg edema and intermittent breakdown of tissue with drainage associated with edema. Currently apply Bactroban ointment prescribed by his medical doctor for these lesions on his right and left lower legs  Objective: Orientated 3 DP 0/4 bilaterally PT 0/4 bilaterally Capillary reflex immediate bilaterally Sensation to 10 g monofilament wire intact 0/5 bilaterally Vibratory sensation nonreactive bilaterally Ankle reflexes reactive bilaterally Lower legs have brawny edema with chronic erythema, bilaterally Left lower leg has scaling serous drainage from superficial wound under treatment with Bactroban. Dried blood around the right hallux without any sign of erythema, edema, warmth or drainage Absent second right toenail with eschar over nailbed without any surrounding erythema, edema, drainage The toenails are extremely elongated, brittle, deformed, hypertrophic and tender to direct palpation 6-10 Hammertoe second bilaterally Manual motor testing dorsi flexion, plantar flexion 5/5 bilaterally  Assessment: Diabetic with peripheral vascular disease Diabetic peripheral neuropathy Mycotic toenails 6-10 Brawny edema with superficial noninfected skin lesion left lower leg  Plan: The toenails 6-10 are debrided mechanically and electronically without any bleedin Maintain wound care orders by primary care physician for draining wound left lower leg   Reappoint 3 months

## 2016-09-04 ENCOUNTER — Other Ambulatory Visit: Payer: Self-pay | Admitting: Family Medicine

## 2016-09-06 ENCOUNTER — Ambulatory Visit (HOSPITAL_COMMUNITY)
Admission: RE | Admit: 2016-09-06 | Discharge: 2016-09-06 | Disposition: A | Discharge status: Home / Self Care | Payer: Medicare Other | Source: Ambulatory Visit | Attending: Internal Medicine | Admitting: Internal Medicine

## 2016-09-06 ENCOUNTER — Encounter (HOSPITAL_COMMUNITY): Payer: Self-pay | Admitting: Internal Medicine

## 2016-09-06 VITALS — BP 128/80 | HR 98 | Wt 359.5 lb

## 2016-09-06 DIAGNOSIS — I482 Chronic atrial fibrillation, unspecified: Secondary | ICD-10-CM

## 2016-09-06 DIAGNOSIS — Z9111 Patient's noncompliance with dietary regimen: Secondary | ICD-10-CM | POA: Diagnosis not present

## 2016-09-06 DIAGNOSIS — Z794 Long term (current) use of insulin: Secondary | ICD-10-CM | POA: Diagnosis not present

## 2016-09-06 DIAGNOSIS — I2510 Atherosclerotic heart disease of native coronary artery without angina pectoris: Secondary | ICD-10-CM | POA: Insufficient documentation

## 2016-09-06 DIAGNOSIS — R0602 Shortness of breath: Secondary | ICD-10-CM | POA: Insufficient documentation

## 2016-09-06 DIAGNOSIS — M549 Dorsalgia, unspecified: Secondary | ICD-10-CM | POA: Diagnosis not present

## 2016-09-06 DIAGNOSIS — K922 Gastrointestinal hemorrhage, unspecified: Secondary | ICD-10-CM | POA: Diagnosis not present

## 2016-09-06 DIAGNOSIS — Z6841 Body Mass Index (BMI) 40.0 and over, adult: Secondary | ICD-10-CM | POA: Diagnosis not present

## 2016-09-06 DIAGNOSIS — G4733 Obstructive sleep apnea (adult) (pediatric): Secondary | ICD-10-CM | POA: Diagnosis not present

## 2016-09-06 DIAGNOSIS — Z87891 Personal history of nicotine dependence: Secondary | ICD-10-CM | POA: Diagnosis not present

## 2016-09-06 DIAGNOSIS — I5032 Chronic diastolic (congestive) heart failure: Secondary | ICD-10-CM | POA: Insufficient documentation

## 2016-09-06 DIAGNOSIS — G5603 Carpal tunnel syndrome, bilateral upper limbs: Secondary | ICD-10-CM | POA: Diagnosis not present

## 2016-09-06 DIAGNOSIS — I480 Paroxysmal atrial fibrillation: Secondary | ICD-10-CM | POA: Insufficient documentation

## 2016-09-06 DIAGNOSIS — G8929 Other chronic pain: Secondary | ICD-10-CM | POA: Insufficient documentation

## 2016-09-06 DIAGNOSIS — Z7901 Long term (current) use of anticoagulants: Secondary | ICD-10-CM | POA: Diagnosis not present

## 2016-09-06 DIAGNOSIS — R42 Dizziness and giddiness: Secondary | ICD-10-CM | POA: Insufficient documentation

## 2016-09-06 DIAGNOSIS — I130 Hypertensive heart and chronic kidney disease with heart failure and stage 1 through stage 4 chronic kidney disease, or unspecified chronic kidney disease: Secondary | ICD-10-CM | POA: Diagnosis not present

## 2016-09-06 DIAGNOSIS — E1122 Type 2 diabetes mellitus with diabetic chronic kidney disease: Secondary | ICD-10-CM | POA: Diagnosis not present

## 2016-09-06 DIAGNOSIS — Z79899 Other long term (current) drug therapy: Secondary | ICD-10-CM | POA: Diagnosis not present

## 2016-09-06 DIAGNOSIS — D509 Iron deficiency anemia, unspecified: Secondary | ICD-10-CM | POA: Insufficient documentation

## 2016-09-06 DIAGNOSIS — E785 Hyperlipidemia, unspecified: Secondary | ICD-10-CM | POA: Diagnosis not present

## 2016-09-06 DIAGNOSIS — Z7982 Long term (current) use of aspirin: Secondary | ICD-10-CM | POA: Diagnosis not present

## 2016-09-06 DIAGNOSIS — N183 Chronic kidney disease, stage 3 (moderate): Secondary | ICD-10-CM | POA: Diagnosis not present

## 2016-09-06 MED ORDER — METOLAZONE 5 MG PO TABS: ORAL_TABLET | ORAL | 11 refills | Status: DC

## 2016-09-06 NOTE — Patient Instructions (Signed)
DECREASE Metolazone to 5mg  twice weekly on Mondays and Fridays.  Follow up with Dr.Bensimhon in 6 months

## 2016-09-06 NOTE — Progress Notes (Signed)
Advanced Heart Failure Clinic Note   Referring Physician: Almyra Deforest, PA  Primary Care: Dr. Janne Napoleon Primary Cardiologist: Dr. Stanford Breed  HPI: [name] is a 66 y.o. male with PMH of morbid obesity, CAD (DES to RCA in 2012), chronic diastolic heart failure, stage III CKD, HTN, HLD, DM II, chronic atrial fibrillation (on Xarelto) and OSA.   Most recently admitted in Nov. 2017 for acute on chronic diastolic CHF, Echo that admission showed an EF of 60-65%. His discharge weight was 362 pounds. Was seen in follow up at [facility] and his weight was up 10 pounds, he had been taking metolazone daily for one week prior. He was referred to [place] as it was felt that his volume status was hard to assess and there is a need to determine the best diuretic regimen for him.  His mobility is limited by chronic back pain, uses a wheeled walker to get around. He drinks about 2-3 liters of fluid a day, mostly sodas. He gets SOB when walking short distances, like from the waiting room to the exam room. He does not get SOB with bathing or dressing. Sleeps in a recliner, endorses orthopnea.   Says he weighs himself a few times a week and has been losing weight recently. Does have some periodic orthostatic symptoms, had some dizziness today. Has noticed increased dizziness in the past 2 weeks.    Review of Systems: [y] = yes, _0  = no   General: Weight gain _1 ; Weight loss Blue.Reese ]; Anorexia _2 ; Fatigue Blue.Reese ]; Fever [ n]; Chills _3 ; Weakness [ y]  Cardiac: Chest pain/pressure [ n]; Resting SOB _4 ; Exertional SOB Blue.Reese ]; Orthopnea Blue.Reese ]; Pedal Edema _5 ; Palpitations _6 ; Syncope [ n]; Presyncope _7 ; Paroxysmal nocturnal dyspnea_8   Pulmonary: Cough [ n]; Wheezing_9 ; Hemoptysis_10 ; Sputum _11 ; Snoring _12   GI: Vomiting[n ]; Dysphagia_13 ; Melena_14 ; Hematochezia _15 ; Heartburn_16 ; Abdominal pain _17 ; Constipation _18 ; Diarrhea _19 ; BRBPR _20   GU: Hematuria_21 ; Dysuria _22 ; Nocturia_23   Vascular: Pain in legs  with walking _24 ; Pain in feet with lying flat [n ]; Non-healing sores _25 ; Stroke _26 ; TIA _27 ; Slurred speech _28 ;  Neuro: Headaches[ n]; Vertigo_29 ; Seizures_30 ; Paresthesias_31 ;Blurred vision _32 ; Diplopia _33 ; Vision changes _34   Ortho/Skin: Arthritis _35 ; Joint pain _36 ; Muscle pain _37 ; Joint swelling _38 ; Back Pain _39 ; Rash _40   Psych: Depression[y ]; Anxiety_41   Heme: Bleeding problems _42 ; Clotting disorders _43 ; Anemia _44   Endocrine: Diabetes [ y]; Thyroid dysfunction_45    Past Medical History:  Diagnosis Date  . Arthritis    "hands and lower back" (09/19/2014)  . CAD (coronary artery disease)    a. s/p PCI to RCA in 2012. b. prior cath in 01/2014 with elevated L/RH pressures, mild-mod CAD of LAD/LCx with patent RCA, normal EF, c/b CIN/CHF.  [name] Carpal tunnel syndrome, bilateral   . Cellulitis   . Chronic diastolic CHF (congestive heart failure) ([place])   . Chronic kidney disease (CKD), stage III (moderate)   . Chronic lower back pain   . History of blood transfusion ~ 1954   "related to OR"  . Hyperlipidemia   . Hypertension   . Hypoxia    a. Qualified for home O2 at [place] in 09/2014.  [name] Lower GI bleed   .  Microcytic anemia   . Morbid obesity ([place])   . Neuropathy ([place])   . OSA (obstructive sleep apnea)    "I wear nasal prongs; haven't been using prongs recently" (09/19/2014)  . PAF (paroxysmal atrial fibrillation) ([place])    TEE DCCV 09/23/2014  . Physical deconditioning   . Pilonidal cyst 1980's; 01/25/2013  . Scrotal abscess   . Type II diabetes mellitus ([place])     Current Outpatient Prescriptions  Medication Sig Dispense Refill  . ACCU-CHEK AVIVA PLUS test strip USE AS INSTRUCTED TO TEST BLOOD SUGAR 6 TIMES DAILY BEFORE MEALS. DX CODE- E11.65 200 each 2  . aspirin EC 81 MG EC tablet Take 1 tablet (81 mg total) by mouth daily. 30 tablet 2  . BD INSULIN SYRINGE ULTRAFINE 31G X 15/64" 0.3 ML MISC USE TO IJNECT INSULIN 3 TIMES DAILY. 100 each 5  . Blood Glucose Monitoring  Suppl (ACCU-CHEK AVIVA PLUS) w/Device KIT USE TO CHECK BLOOD SUGAR 6 TIMES PER DAY DX CODE E11.65 1 kit 1  . gabapentin (NEURONTIN) 300 MG capsule Take 2 capsules (600 mg total) by mouth 2 (two) times daily. 60 capsule 3  . HUMALOG KWIKPEN 200 UNIT/ML SOPN INJECT 80 TO 100 UNITS 3 TIMES A DAY DEPENDING ON MEAL SIZE 200 mL 2  . hydrocerin (EUCERIN) CREA Apply 1 application topically 3 (three) times daily. legs 454 g 3  . Hypromellose (NATURAL BALANCE TEARS OP) Place 1 drop into both eyes daily as needed (for dry eyes).     . insulin glargine (LANTUS) 100 UNIT/ML injection Inject 0.5 mLs (50 Units total) into the skin 2 (two) times daily. 30 mL 3  . Insulin Pen Needle 31G X 5 MM MISC Use to inject insulin 4 times daily 130 each 4  . Lancets (ACCU-CHEK MULTICLIX) lancets Use to test blood sugar 6 times daily- Dx code- E11.65 204 each 5  . metolazone (ZAROXOLYN) 5 MG tablet Take 1 tablet (5 mg total) by mouth 3 (three) times a week. Mondays, Wednesdays, and Fridays 15 tablet 11  . metoprolol (LOPRESSOR) 50 MG tablet Take 50 mg by mouth 2 (two) times daily. TWO TABLETS TWICE A DAY    . Multiple Vitamin (MULTIVITAMIN WITH MINERALS) TABS Take 1 tablet by mouth daily.    . mupirocin cream (BACTROBAN) 2 % APPLY TOPICALLY 2 TIMES DAILY 30 g 0  . oxyCODONE 20 MG TABS Take 1 tablet (20 mg total) by mouth every 6 (six) hours as needed for severe pain. (Patient taking differently: Take 20-30 mg by mouth every 6 (six) hours as needed for severe pain. ) 30 tablet 0  . potassium chloride SA (K-DUR,KLOR-CON) 20 MEQ tablet Take 2 tablets (40 mEq total) by mouth 2 (two) times daily. 60 tablet 2  . rivaroxaban (XARELTO) 20 MG TABS tablet Take 1 tablet (20 mg total) by mouth daily with supper. 30 tablet 3  . spironolactone (ALDACTONE) 25 MG tablet Take 25 mg by mouth daily.    [name] torsemide (DEMADEX) 20 MG tablet Take 4 tablets (80 mg total) by mouth 2 (two) times daily. 240 tablet 11  . atorvastatin (LIPITOR) 80 MG tablet  Take 1 tablet (80 mg total) by mouth daily. 30 tablet 10   No current facility-administered medications for this encounter.     No Known Allergies    Social History   Social History  . Marital status: Single    Spouse name: N/A  . Number of children: Y  . Years of education: N/A  Occupational History  . disabled truck driver    Social History Main Topics  . Smoking status: Former Smoker    Packs/day: 1.00    Years: 20.00    Types: Cigarettes    Quit date: 07/13/1983  . Smokeless tobacco: Never Used  . Alcohol use No  . Drug use: No  . Sexual activity: Not Currently   Other Topics Concern  . Not on file   Social History Narrative   Pt is single.   Lives with sister.   Has children.   Was adopted.   Daily cafffiene-2 cups of coffee and 2 sodas per day            Family History  Problem Relation Age of Onset  . Adopted: Yes  . Other Other     PT ADOPTED    Vitals:   09/06/16 1127  BP: 128/80  Pulse: 98  SpO2: 96%  Weight: (!) 359 lb 8 oz (163.1 kg)     PHYSICAL EXAM: General:  Obese male, NAD.  HEENT: normal Neck: supple. Hard to assess JVD. Does not appear elevated. Carotids 2+ bilat; no bruits. No lymphadenopathy or thyromegaly appreciated. Cor: PMI nondisplaced. Regular rate & rhythm, distant heart sounds. No rubs, gallops or murmurs. Lungs: RUL expiratory wheezing, all other lobes clear/diminished.  Abdomen: obese, soft, nontender, nondistended. No hepatosplenomegaly. No bruits or masses. Good bowel sounds. Extremities: no cyanosis, clubbing. BLE have woody appearance with multiple excoriations.  Neuro: alert & oriented x 3, cranial nerves grossly intact. moves all 4 extremities w/o difficulty. Affect pleasant.    ASSESSMENT & PLAN:  1. Chronic diastolic CHF: Volume status appears stable on current regimen, although his volume status is tenuous as it is complicated by his dietary noncompliance. Heavy intake of fluid 2-3 L a day, sometimes  more. Does not follow a low salt diet.  - Continue torsemide 77m BID. Would not increase diuretic with his recent spells of dizziness.  - Continue metolazone, however decrease to Monday and Friday only.  - Continue SArlyce Harman 282mdaily - Advised patient to decrease fluid intake to <2L a day.  2. CKD stage III - Stable, followed by PCP. Creatinine 1.89 earlier this month.  3. Chronic atrial fibrillation:  - Rate controlled. Continue metoprolol.  - This patients CHA2DS2-VASc Score and unadjusted Ischemic Stroke Rate (% per year) is equal to 4.8 % stroke rate/year from a score of 4 Above score calculated as 1 point each if present [CHF, HTN, DM, Vascular=MI/PAD/Aortic Plaque, Age if 65-74, or Male], 2 points each if present [Age > 75, or Stroke/TIA/TE] - Continue Xarelto.  4. Obesity - Counseled about dietary modifications.  - BMI 48.76 5. History of CAD: Denies chest pain. - Continue ASA and lipitor.   ErArbutus LeasNP  Patient seen and examined with ErJettie BoozeNP. We discussed all aspects of the encounter. I agree with the assessment and plan as stated above.   Overall volume status looks pretty good. Give orthostatic symptoms will decrease metolazone to 2x/week. Can increase back up as needed for worsening edema. Long discussion about need for fluid restriction and daily weights. Stressed need for CPAP. AF is rate controlled. Continue Xarelto.   Bensimhon, Daniel,MD 7:50 PM

## 2016-09-07 ENCOUNTER — Encounter: Payer: Medicare Other | Admitting: Nutrition

## 2016-09-08 ENCOUNTER — Ambulatory Visit: Payer: Medicare Other | Attending: Internal Medicine | Admitting: Internal Medicine

## 2016-09-08 ENCOUNTER — Encounter: Payer: Self-pay | Admitting: Internal Medicine

## 2016-09-08 VITALS — BP 126/73 | HR 113 | Temp 97.8°F | Resp 16 | Wt 366.4 lb

## 2016-09-08 DIAGNOSIS — E876 Hypokalemia: Secondary | ICD-10-CM

## 2016-09-08 DIAGNOSIS — I878 Other specified disorders of veins: Secondary | ICD-10-CM | POA: Diagnosis not present

## 2016-09-08 DIAGNOSIS — E785 Hyperlipidemia, unspecified: Secondary | ICD-10-CM | POA: Diagnosis not present

## 2016-09-08 DIAGNOSIS — K922 Gastrointestinal hemorrhage, unspecified: Secondary | ICD-10-CM | POA: Insufficient documentation

## 2016-09-08 DIAGNOSIS — Z79899 Other long term (current) drug therapy: Secondary | ICD-10-CM | POA: Diagnosis not present

## 2016-09-08 DIAGNOSIS — I480 Paroxysmal atrial fibrillation: Secondary | ICD-10-CM | POA: Insufficient documentation

## 2016-09-08 DIAGNOSIS — G5603 Carpal tunnel syndrome, bilateral upper limbs: Secondary | ICD-10-CM | POA: Diagnosis not present

## 2016-09-08 DIAGNOSIS — I130 Hypertensive heart and chronic kidney disease with heart failure and stage 1 through stage 4 chronic kidney disease, or unspecified chronic kidney disease: Secondary | ICD-10-CM | POA: Diagnosis not present

## 2016-09-08 DIAGNOSIS — E1122 Type 2 diabetes mellitus with diabetic chronic kidney disease: Secondary | ICD-10-CM | POA: Diagnosis present

## 2016-09-08 DIAGNOSIS — G4733 Obstructive sleep apnea (adult) (pediatric): Secondary | ICD-10-CM | POA: Diagnosis not present

## 2016-09-08 DIAGNOSIS — E11319 Type 2 diabetes mellitus with unspecified diabetic retinopathy without macular edema: Secondary | ICD-10-CM | POA: Insufficient documentation

## 2016-09-08 DIAGNOSIS — Z794 Long term (current) use of insulin: Secondary | ICD-10-CM | POA: Insufficient documentation

## 2016-09-08 DIAGNOSIS — I5032 Chronic diastolic (congestive) heart failure: Secondary | ICD-10-CM | POA: Diagnosis not present

## 2016-09-08 DIAGNOSIS — E1165 Type 2 diabetes mellitus with hyperglycemia: Secondary | ICD-10-CM | POA: Diagnosis not present

## 2016-09-08 DIAGNOSIS — IMO0002 Reserved for concepts with insufficient information to code with codable children: Secondary | ICD-10-CM

## 2016-09-08 DIAGNOSIS — Z7982 Long term (current) use of aspirin: Secondary | ICD-10-CM | POA: Insufficient documentation

## 2016-09-08 DIAGNOSIS — E118 Type 2 diabetes mellitus with unspecified complications: Secondary | ICD-10-CM

## 2016-09-08 DIAGNOSIS — I2510 Atherosclerotic heart disease of native coronary artery without angina pectoris: Secondary | ICD-10-CM | POA: Diagnosis not present

## 2016-09-08 DIAGNOSIS — E113299 Type 2 diabetes mellitus with mild nonproliferative diabetic retinopathy without macular edema, unspecified eye: Secondary | ICD-10-CM

## 2016-09-08 DIAGNOSIS — Z7901 Long term (current) use of anticoagulants: Secondary | ICD-10-CM | POA: Insufficient documentation

## 2016-09-08 DIAGNOSIS — D509 Iron deficiency anemia, unspecified: Secondary | ICD-10-CM | POA: Insufficient documentation

## 2016-09-08 DIAGNOSIS — R000 Tachycardia, unspecified: Secondary | ICD-10-CM

## 2016-09-08 LAB — GLUCOSE, POCT (MANUAL RESULT ENTRY): POC Glucose: 181 mg/dl — AB (ref 70–99)

## 2016-09-08 NOTE — Progress Notes (Signed)
[name], is a 66 y.o. male  WUJ:[id number]  NFA:[id number]  DOB - [birth year]  Chief Complaint  Patient presents with  . Follow-up        Subjective:   [name] is a 66 y.o. male here today for a follow up visit for chronic diastolic chf, dm, morbid obesity, osa intolerant of cpap. He recently was seen in CHF clinic 09/06/16 and vss at time and metalozone reduced to 52m Monday and Friday.  He states his chronic venous stasis changes in legs about same, still w/ weeping wounds, he "pops them sometimes with his fingers". Requested to see woundcare, he has seen them in past.  He states a little out breath since getting to clinic, but better now. Denies cp. Still pending insulin pump per his Endo pending insurance approval.  He wants to hold off on colonscopy screening currently due to seeing so many doctors currently.   Patient has No headache, No chest pain, No abdominal pain - No Nausea, No new weakness tingling or numbness, No Cough - SOB.  No problems updated.  ALLERGIES: No Known Allergies  PAST MEDICAL HISTORY: Past Medical History:  Diagnosis Date  . Arthritis    "hands and lower back" (09/19/2014)  . CAD (coronary artery disease)    a. s/p PCI to RCA in 2012. b. prior cath in 01/2014 with elevated L/RH pressures, mild-mod CAD of LAD/LCx with patent RCA, normal EF, c/b CIN/CHF.  .Marland KitchenCarpal tunnel syndrome, bilateral   . Cellulitis   . Chronic diastolic CHF (congestive heart failure) ([place]   . Chronic kidney disease (CKD), stage III (moderate)   . Chronic lower back pain   . History of blood transfusion ~ 1954   "related to OR"  . Hyperlipidemia   . Hypertension   . Hypoxia    a. Qualified for home O2 at [place] in 09/2014.  .Marland KitchenLower GI bleed   . Microcytic anemia   . Morbid obesity ([place]   . Neuropathy ([place]   . OSA (obstructive sleep apnea)    "I wear nasal prongs; haven't been using prongs recently" (09/19/2014)  . PA[place] ([place]oxysmal [place] fibrillation) [place]      TEE DCCV 09/23/2014  . Physical deconditioning   . Pilonidal cyst 1980's; 01/25/2013  . Scrotal abscess   . Type II diabetes mellitus ([place]     MEDICATIONS AT HOME: Prior to Admission medications   Medication Sig Start Date End Date Taking? Authorizing Provider  ACCU-CHEK AVIVA PLUS test strip USE AS INSTRUCTED TO TEST BLOOD SUGAR 6 TIMES DAILY BEFORE MEALS. DX CODE- E11.65 08/26/16   AElayne Snare MD  aspirin EC 81 MG EC tablet Take 1 tablet (81 mg total) by mouth daily. 06/17/16   NReyne Dumas MD  atorvastatin (LIPITOR) 80 MG tablet Take 1 tablet (80 mg total) by mouth daily. 05/25/16   DMaren Reamer MD  BD INSULIN SYRINGE ULTRAFINE 31G X 15/64" 0.3 ML MISC USE TO IJNECT INSULIN 3 TIMES DAILY. 08/20/16   AElayne Snare MD  Blood Glucose Monitoring Suppl (ACCU-CHEK AVIVA PLUS) w/Device KIT USE TO CHECK BLOOD SUGAR 6 TIMES PER DAY DX CODE E11.65 08/05/16   AElayne Snare MD  gabapentin (NEURONTIN) 300 MG capsule Take 2 capsules (600 mg total) by mouth 2 (two) times daily. 05/25/16   DMaren Reamer MD  HUMALOG KWIKPEN 200 UNIT/ML SOPN INJECT 80 TO 100 UNITS 3 TIMES A DAY DEPENDING ON MEAL SIZE 07/09/16   AElayne Snare MD  hydrocerin (EUCERIN) CREA  Apply 1 application topically 3 (three) times daily. legs 05/25/16   [name], MD  Hypromellose (NATURAL BALANCE TEARS OP) Place 1 drop into both eyes daily as needed (for dry eyes).     Historical Provider, MD  insulin glargine (LANTUS) 100 UNIT/ML injection Inject 0.5 mLs (50 Units total) into the skin 2 (two) times daily. 07/19/16   [name], MD  Insulin Pen Needle 31G X 5 MM MISC Use to inject insulin 4 times daily 08/24/16   Elayne Snare, MD  Lancets (ACCU-CHEK MULTICLIX) lancets Use to test blood sugar 6 times daily- Dx code- E11.65 08/31/16   Elayne Snare, MD  metolazone (ZAROXOLYN) 5 MG tablet Take 84m twice weekly on Mondays and Fridays. 09/06/16   DJolaine Artist MD  metoprolol (LOPRESSOR) 50 MG tablet Take 50 mg by mouth 2 (two) times  daily. TWO TABLETS TWICE A DAY    Historical Provider, MD  Multiple Vitamin (MULTIVITAMIN WITH MINERALS) TABS Take 1 tablet by mouth daily.    Historical Provider, MD  mupirocin cream (BACTROBAN) 2 % APPLY TOPICALLY 2 TIMES DAILY 09/06/16   EArnoldo Morale MD  oxyCODONE 20 MG TABS Take 1 tablet (20 mg total) by mouth every 6 (six) hours as needed for severe pain. Patient taking differently: Take 20-30 mg by mouth every 6 (six) hours as needed for severe pain.  12/26/15   Hillary MCorinda Gubler MD  potassium chloride SA (K-DUR,KLOR-CON) 20 MEQ tablet Take 2 tablets (40 mEq total) by mouth 2 (two) times daily. 08/09/16   EArnoldo Morale MD  rivaroxaban (XARELTO) 20 MG TABS tablet Take 1 tablet (20 mg total) by mouth daily with supper. 05/25/16   DMaren Reamer MD  spironolactone (ALDACTONE) 25 MG tablet Take 25 mg by mouth daily.    Historical Provider, MD  torsemide (DEMADEX) 20 MG tablet Take 4 tablets (80 mg total) by mouth 2 (two) times daily. 06/28/16   HAlmyra Deforest PA     Objective:   Vitals:   09/08/16 1038  BP: (!) 174/83  Pulse: (!) 132  Resp: 16  Temp: 97.8 F (36.6 C)  TempSrc: Oral  SpO2: 97%  Weight: (!) 366 lb 6.4 oz (166.2 kg)    Exam General appearance : Awake, alert, not in any distress. Speech Clear. Not toxic looking, morbid obese, pleasant. Walking w/ roller walker.   HEENT: Atraumatic and Normocephalic, pupils equally reactive to light. Neck: supple, no JVD.  Chest:Good air entry bilaterally, no added sounds. CVS: S1 S2 regular, no murmurs/gallups or rubs. Abdomen: Bowel sounds active, Non tender and not distended with no gaurding, rigidity or rebound. Extremities: chronic stasis dermatitis findings bilat legs, w/ clear weeping wounds.  Nttp. No acute cellulitis findings. both legs are warm to touch. Per pt, unchanged last few weeks. Neurology: Awake alert, and oriented X 3, CN II-XII grossly intact, Non focal Skin:No Rash  Data Review Lab Results  Component Value  Date   HGBA1C 8.1 (H) 08/20/2016   HGBA1C 8.1 (H) 06/10/2016   HGBA1C 8.9 03/03/2016    Depression screen PHQ 2/9 09/08/2016 08/06/2016 07/22/2016 06/25/2016 05/25/2016  Decreased Interest 0 2 3 2 3   Down, Depressed, Hopeless 0 0 1 0 2  PHQ - 2 Score 0 2 4 2 5   Altered sleeping 1 0 0 0 0  Tired, decreased energy 1 2 3 2 3   Change in appetite 0 3 3 1  0  Feeling bad or failure about yourself  0 0 0 0 0  Trouble concentrating 0 0 0 0 0  Moving slowly or fidgety/restless 0 0 0 0 0  Suicidal thoughts 0 0 0 0 0  PHQ-9 Score 2 7 10 5 8   Some recent data might be hidden      Assessment & Plan   1. 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 Venous stasis No active signs of infection, but serous drainage. Carilyn Goodpasture RN to redress wounds w/ top antibiotics, - amb ref to Wound Care clinic as well.  3. Obstructive sleep apnea- unable to tolerate c-pap Not tol of cpap, but does use o2 at night  4. Chronic diastolic congestive heart failure (HCC) Appears evolemic, defer to chf clinic. - continue xarelto, demedex 34m po bid, spironolactone 25 qd, metolazone 568mMonday and Friday, kdur, metoprolol 50bid, asa 81, lipitor 80 qhs.  5. Hypokalemia - kdur replacements  6. Still needs colonoscopy, hx of anemia, but wants to hold off currently due to too many other md appts.  7. Tachycardia - improved after resting a bit, normal 2 days ago in CHF clinic, suspect exertional/deconditioning.  8. Morbid obesity May be candidate for weightloss surgery if continues to decline, has numerous medical comorbities.  Patient have been counseled extensively about nutrition and exercise  Return in about 6 weeks (around 10/20/2016), or if symptoms worsen or fail to improve.  The patient was given clear instructions to go to ER or return to medical center if symptoms don't improve, worsen or new problems develop. The patient  verbalized understanding. The patient was told to call to get lab results if they haven't heard anything in the next week.   This note has been created with DrSurveyor, quantityAny transcriptional errors are unintentional.   DaMaren ReamerMD, MBBlainend [facility] 09/08/2016, 10:56 AM

## 2016-09-19 ENCOUNTER — Other Ambulatory Visit: Payer: Self-pay | Admitting: Family Medicine

## 2016-09-21 ENCOUNTER — Ambulatory Visit: Payer: Medicare Other | Admitting: Endocrinology

## 2016-09-22 ENCOUNTER — Encounter (HOSPITAL_BASED_OUTPATIENT_CLINIC_OR_DEPARTMENT_OTHER): Payer: Medicare Other | Attending: Surgery

## 2016-09-23 ENCOUNTER — Encounter: Payer: Medicare Other | Admitting: Dietician

## 2016-09-24 ENCOUNTER — Ambulatory Visit: Payer: Medicare Other | Admitting: Endocrinology

## 2016-10-07 ENCOUNTER — Other Ambulatory Visit: Payer: Self-pay | Admitting: Internal Medicine

## 2016-10-12 ENCOUNTER — Other Ambulatory Visit (INDEPENDENT_AMBULATORY_CARE_PROVIDER_SITE_OTHER): Payer: Medicare Other

## 2016-10-12 DIAGNOSIS — Z794 Long term (current) use of insulin: Secondary | ICD-10-CM | POA: Diagnosis not present

## 2016-10-12 DIAGNOSIS — E1165 Type 2 diabetes mellitus with hyperglycemia: Secondary | ICD-10-CM

## 2016-10-13 ENCOUNTER — Other Ambulatory Visit: Payer: Self-pay | Admitting: Endocrinology

## 2016-10-13 DIAGNOSIS — Z794 Long term (current) use of insulin: Secondary | ICD-10-CM | POA: Diagnosis not present

## 2016-10-13 DIAGNOSIS — E1165 Type 2 diabetes mellitus with hyperglycemia: Secondary | ICD-10-CM | POA: Diagnosis not present

## 2016-10-13 LAB — BASIC METABOLIC PANEL
BUN: 33 mg/dL — AB (ref 6–23)
CALCIUM: 10.2 mg/dL (ref 8.4–10.5)
CO2: 32 meq/L (ref 19–32)
CREATININE: 1.95 mg/dL — AB (ref 0.40–1.50)
Chloride: 88 mEq/L — ABNORMAL LOW (ref 96–112)
GFR: 36.88 mL/min — ABNORMAL LOW (ref >60.00)
GLUCOSE: 184 mg/dL — AB (ref 70–99)
Potassium: 3.9 mEq/L (ref 3.5–5.1)
Sodium: 134 mEq/L — ABNORMAL LOW (ref 135–145)

## 2016-10-14 ENCOUNTER — Telehealth: Payer: Self-pay

## 2016-10-14 DIAGNOSIS — E1142 Type 2 diabetes mellitus with diabetic polyneuropathy: Secondary | ICD-10-CM | POA: Diagnosis not present

## 2016-10-14 DIAGNOSIS — M4726 Other spondylosis with radiculopathy, lumbar region: Secondary | ICD-10-CM | POA: Diagnosis not present

## 2016-10-14 DIAGNOSIS — Z79891 Long term (current) use of opiate analgesic: Secondary | ICD-10-CM | POA: Diagnosis not present

## 2016-10-14 DIAGNOSIS — G894 Chronic pain syndrome: Secondary | ICD-10-CM | POA: Diagnosis not present

## 2016-10-14 LAB — FRUCTOSAMINE: Fructosamine: 297 umol/L — ABNORMAL HIGH (ref 0–285)

## 2016-10-14 NOTE — Telephone Encounter (Signed)
Called pt to find out if he would like to have colonoscopy scheduled. Phone number not in service.

## 2016-10-15 ENCOUNTER — Ambulatory Visit: Payer: Medicare Other | Admitting: Endocrinology

## 2016-10-15 ENCOUNTER — Other Ambulatory Visit: Payer: Self-pay

## 2016-10-15 ENCOUNTER — Telehealth: Payer: Self-pay | Admitting: Endocrinology

## 2016-10-15 NOTE — Telephone Encounter (Signed)
Pt is coming into start the VGo next Tuesday, and will need the Humulin U-[facility] to [facility] [address]  Also, he wanted to know if Dr. Lucianne Muss had a chance to look at his labs, he is concerned about his Potassium.

## 2016-10-16 ENCOUNTER — Other Ambulatory Visit: Payer: Self-pay | Admitting: Endocrinology

## 2016-10-16 MED ORDER — INSULIN REGULAR HUMAN (CONC) 500 UNIT/ML ~~LOC~~ SOLN: SUBCUTANEOUS | 0 refills | Status: DC

## 2016-10-16 NOTE — Telephone Encounter (Signed)
Rx sent, potassium ok

## 2016-10-18 NOTE — Telephone Encounter (Signed)
-----   Message from Reather Littler, MD sent at 10/16/2016  4:53 PM EDT ----- Regarding: appt He needs to be seen end of next week, 3-5 days after he sees [facility] for V-go pump

## 2016-10-18 NOTE — Telephone Encounter (Signed)
LM for pt to call back to schedule appt 3 days after the 17th of April

## 2016-10-19 ENCOUNTER — Encounter: Payer: Medicare Other | Admitting: Nutrition

## 2016-10-24 DIAGNOSIS — G4733 Obstructive sleep apnea (adult) (pediatric): Secondary | ICD-10-CM | POA: Diagnosis not present

## 2016-10-25 ENCOUNTER — Other Ambulatory Visit: Payer: Self-pay

## 2016-10-25 MED ORDER — ACCU-CHEK AVIVA PLUS VI STRP: ORAL_STRIP | 2 refills | Status: DC

## 2016-10-26 ENCOUNTER — Other Ambulatory Visit: Payer: Self-pay | Admitting: Pharmacist

## 2016-10-26 ENCOUNTER — Encounter: Payer: Medicare Other | Attending: Endocrinology | Admitting: Nutrition

## 2016-10-26 ENCOUNTER — Other Ambulatory Visit: Payer: Self-pay

## 2016-10-26 DIAGNOSIS — Z713 Dietary counseling and surveillance: Secondary | ICD-10-CM | POA: Diagnosis not present

## 2016-10-26 DIAGNOSIS — Z794 Long term (current) use of insulin: Secondary | ICD-10-CM | POA: Diagnosis not present

## 2016-10-26 DIAGNOSIS — E1165 Type 2 diabetes mellitus with hyperglycemia: Secondary | ICD-10-CM | POA: Diagnosis not present

## 2016-10-26 DIAGNOSIS — E118 Type 2 diabetes mellitus with unspecified complications: Secondary | ICD-10-CM

## 2016-10-26 DIAGNOSIS — IMO0002 Reserved for concepts with insufficient information to code with codable children: Secondary | ICD-10-CM

## 2016-10-26 MED ORDER — ACCU-CHEK AVIVA PLUS VI STRP: ORAL_STRIP | 2 refills | Status: DC

## 2016-10-26 MED ORDER — INSULIN GLARGINE 100 UNIT/ML ~~LOC~~ SOLN: 50.0000 [IU] | Freq: Two times a day (BID) | SUBCUTANEOUS | 0 refills | Status: DC

## 2016-10-26 NOTE — Progress Notes (Signed)
We discussed the V-go, how it works, how to fill, apply and use it.  He said that his insurance will pay for the U-500 insulin, but does not want to learn this until he knows for sure that his insurance will pay for it.   He filled out a form and I faxed the information to [employer]. He was given my number to call me, and let me know if he wants to try this.  He was instructed to bring the U-500 insulin and to not take his Lantus the night before, or that morning, he will start this.  He reported good understanding of this, and had no final questions.

## 2016-10-26 NOTE — Patient Instructions (Signed)
Call to let me know if you would like to try this.  [phone]

## 2016-10-27 ENCOUNTER — Other Ambulatory Visit: Payer: Self-pay | Admitting: Internal Medicine

## 2016-10-30 ENCOUNTER — Other Ambulatory Visit: Payer: Self-pay | Admitting: Internal Medicine

## 2016-11-01 ENCOUNTER — Ambulatory Visit: Payer: Medicare Other | Admitting: Endocrinology

## 2016-11-04 DIAGNOSIS — K922 Gastrointestinal hemorrhage, unspecified: Secondary | ICD-10-CM | POA: Diagnosis not present

## 2016-11-04 DIAGNOSIS — G4733 Obstructive sleep apnea (adult) (pediatric): Secondary | ICD-10-CM | POA: Diagnosis not present

## 2016-11-11 DIAGNOSIS — G894 Chronic pain syndrome: Secondary | ICD-10-CM | POA: Diagnosis not present

## 2016-11-11 DIAGNOSIS — Z79891 Long term (current) use of opiate analgesic: Secondary | ICD-10-CM | POA: Diagnosis not present

## 2016-11-11 DIAGNOSIS — M4726 Other spondylosis with radiculopathy, lumbar region: Secondary | ICD-10-CM | POA: Diagnosis not present

## 2016-11-11 DIAGNOSIS — E1142 Type 2 diabetes mellitus with diabetic polyneuropathy: Secondary | ICD-10-CM | POA: Diagnosis not present

## 2016-11-22 ENCOUNTER — Other Ambulatory Visit: Payer: Self-pay | Admitting: Endocrinology

## 2016-11-23 DIAGNOSIS — G4733 Obstructive sleep apnea (adult) (pediatric): Secondary | ICD-10-CM | POA: Diagnosis not present

## 2016-11-24 ENCOUNTER — Other Ambulatory Visit: Payer: Self-pay

## 2016-11-24 MED ORDER — INSULIN REGULAR HUMAN (CONC) 500 UNIT/ML ~~LOC~~ SOLN: SUBCUTANEOUS | 0 refills | Status: DC

## 2016-11-25 ENCOUNTER — Encounter: Payer: Self-pay | Admitting: Internal Medicine

## 2016-11-25 ENCOUNTER — Other Ambulatory Visit: Payer: Self-pay

## 2016-11-25 MED ORDER — INSULIN REGULAR HUMAN (CONC) 500 UNIT/ML ~~LOC~~ SOLN: SUBCUTANEOUS | 0 refills | Status: DC

## 2016-12-01 ENCOUNTER — Ambulatory Visit: Payer: Medicare Other | Admitting: Podiatry

## 2016-12-04 DIAGNOSIS — G4733 Obstructive sleep apnea (adult) (pediatric): Secondary | ICD-10-CM | POA: Diagnosis not present

## 2016-12-04 DIAGNOSIS — K922 Gastrointestinal hemorrhage, unspecified: Secondary | ICD-10-CM | POA: Diagnosis not present

## 2016-12-07 ENCOUNTER — Telehealth: Payer: Self-pay | Admitting: Endocrinology

## 2016-12-07 NOTE — Telephone Encounter (Signed)
LM for pt to call back to schedule a follow up

## 2016-12-09 DIAGNOSIS — M4726 Other spondylosis with radiculopathy, lumbar region: Secondary | ICD-10-CM | POA: Diagnosis not present

## 2016-12-09 DIAGNOSIS — E1142 Type 2 diabetes mellitus with diabetic polyneuropathy: Secondary | ICD-10-CM | POA: Diagnosis not present

## 2016-12-09 DIAGNOSIS — Z79891 Long term (current) use of opiate analgesic: Secondary | ICD-10-CM | POA: Diagnosis not present

## 2016-12-09 DIAGNOSIS — G894 Chronic pain syndrome: Secondary | ICD-10-CM | POA: Diagnosis not present

## 2016-12-14 ENCOUNTER — Telehealth: Payer: Self-pay | Admitting: Endocrinology

## 2016-12-14 ENCOUNTER — Other Ambulatory Visit: Payer: Self-pay | Admitting: Family Medicine

## 2016-12-14 NOTE — Telephone Encounter (Signed)
Please read the note at the bottom and advise if dose needs to be adjusted. Please advise.

## 2016-12-14 NOTE — Telephone Encounter (Signed)
Called patient and gave him the message from Dr. Lucianne Muss to reach out to his PCP or cardiologist for this medication request.

## 2016-12-14 NOTE — Telephone Encounter (Signed)
**  Remind patient they can make refill requests via MyChart**  Medication refill request (Name & Dosage):  KLOR-CON M20 20 MEQ tablet  Preferred pharmacy (Name & [phone] [place], [place] - [address] [facility] [address] [phone] (Phone) [phone] (Fax)      Other comments (if applicable):    Patient would like the 40 MEQ if possible due to disliking swallowing 2 pills. Please call patient to advise and once rx is placed.

## 2016-12-14 NOTE — Telephone Encounter (Signed)
He has been asked to talk to his PCP or cardiologist about his potassium prescriptions, I am not treating him for this

## 2016-12-20 ENCOUNTER — Other Ambulatory Visit: Payer: Self-pay | Admitting: Endocrinology

## 2016-12-20 DIAGNOSIS — E118 Type 2 diabetes mellitus with unspecified complications: Principal | ICD-10-CM

## 2016-12-20 DIAGNOSIS — IMO0002 Reserved for concepts with insufficient information to code with codable children: Secondary | ICD-10-CM

## 2016-12-20 DIAGNOSIS — E1165 Type 2 diabetes mellitus with hyperglycemia: Secondary | ICD-10-CM

## 2016-12-21 ENCOUNTER — Ambulatory Visit: Payer: Medicare Other

## 2016-12-21 DIAGNOSIS — IMO0002 Reserved for concepts with insufficient information to code with codable children: Secondary | ICD-10-CM

## 2016-12-21 DIAGNOSIS — E118 Type 2 diabetes mellitus with unspecified complications: Principal | ICD-10-CM

## 2016-12-21 DIAGNOSIS — E1165 Type 2 diabetes mellitus with hyperglycemia: Secondary | ICD-10-CM

## 2016-12-21 LAB — COMPREHENSIVE METABOLIC PANEL
ALK PHOS: 82 U/L (ref 39–117)
ALT: 14 U/L (ref 0–53)
AST: 16 U/L (ref 0–37)
Albumin: 3.3 g/dL — ABNORMAL LOW (ref 3.5–5.2)
BUN: 22 mg/dL (ref 6–23)
CO2: 39 mEq/L — ABNORMAL HIGH (ref 19–32)
CREATININE: 1.37 mg/dL (ref 0.40–1.50)
Calcium: 9.6 mg/dL (ref 8.4–10.5)
Chloride: 87 mEq/L — ABNORMAL LOW (ref 96–112)
GFR: 55.4 mL/min — ABNORMAL LOW (ref >60.00)
GLUCOSE: 56 mg/dL — AB (ref 70–99)
POTASSIUM: 3.3 meq/L — AB (ref 3.5–5.1)
SODIUM: 136 meq/L (ref 135–145)
TOTAL PROTEIN: 7.4 g/dL (ref 6.0–8.3)
Total Bilirubin: 0.5 mg/dL (ref 0.2–1.2)

## 2016-12-21 LAB — HEMOGLOBIN A1C: HEMOGLOBIN A1C: 8.2 % — AB (ref 4.6–6.5)

## 2016-12-22 NOTE — Progress Notes (Signed)
Patient ID: [name], male   DOB: 25-Jun-1951, 66 y.o.   MRN: [id number]            Reason for Appointment:  Follow-up for Type 2 Diabetes    History of Present Illness:          Date of diagnosis of type 2 diabetes mellitus: 25       Background history:   Initially his diabetes was mild and was able to control it with metformin and also Actos. He was also able to exercise and try to lose weight However about 2 years after diagnosis he was started on insulin because of poor control, details not available. His metformin was stopped possibly because of kidney problems Also not clear what his level of control was in the last few years with taking various insulin regimens including Lantus and NovoLog He thinks he was tried on Trulicity for 3-4 months and it did not help his sugars or his weight Because of needing large doses of NovoLog his previous endocrinologist switched him to U-500 Regular Insulin with a syringe   However he thinks that he had blood sugars around 7% at some point with NovoLog and Lantus  Recent history:   INSULIN regimen is:  Lantus 50 bid with Humalog U-200 insulin 60 units 3-4x daily  Non-insulin hypoglycemic drugs the patient is taking are: none  He is again having a high A1c of 8.2, previously 8.1%  Current management, blood sugar patterns and problems identified:  He was supposed to start the V-go pump but he still did not get notification of coverage from his insurance  On his own he has started using some of the Humulin R U-500 insulin and he is doing it somewhat randomly taking 20-30 units based on his blood sugar level.  Also he is taking this sometimes after eating instead of before eating  He also tends to take Humalog only when his blood sugars are high and especially after evening meal   On an average blood sugars are better, previously averaging 234  Most of his variability in blood sugars is overnight  He is tending to have low blood  sugars during the night when he is taking extra insulin late at night  Mostly having high readings after supper with not taking appropriate amount and timing of mealtime insulin He was supposed to see the dietitian but has had difficulty with transportation previously  Side effects from medications have been: none  Compliance with the medical regimen:  fair Hypoglycemia:  As above  Glucose monitoring:  done 1+  times a day         Glucometer:  Accucheck Aviva     Blood Glucose readings by download   Mean values apply above for all meters except median for One Touch  PRE-MEAL Fasting Lunch Dinner Bedtime Overall  Glucose range:  66-243  72-487  154-439  52-402    Mean/median: 150  195  283  256  164+/-121     Self-care: The diet that the patient has been following is: none,  drinks  Regular colas  At times Meal times are: variable, Bfst 7-8; lunch variable; dinner 8-9 pm  Typical meal intake: Breakfast is eggs or cheese              Dietician visit, most recent:  Years ago               Exercise:  none-not able to do Any activities  Weight history:  Wt Readings from Last 3 Encounters:  12/23/16 (!) 355 lb 12.8 oz (161.4 kg)  09/08/16 (!) 366 lb 6.4 oz (166.2 kg)  09/06/16 (!) 359 lb 8 oz (163.1 kg)    Glycemic control:last A1c 8.9   Lab Results  Component Value Date   HGBA1C 8.2 (H) 12/21/2016   HGBA1C 8.1 (H) 08/20/2016   HGBA1C 8.1 (H) 06/10/2016   Lab Results  Component Value Date   MICROALBUR 0.5 03/09/2016   LDLCALC 65 03/03/2016   CREATININE 1.37 12/21/2016   Lab Results  Component Value Date   MICRALBCREAT 13 03/09/2016       Allergies as of 12/23/2016   No Known Allergies     Medication List       Accurate as of 12/23/16  8:54 PM. Always use your most recent med list.          ACCU-CHEK AVIVA PLUS test strip Generic drug:  glucose blood USE AS INSTRUCTED TO TEST BLOOD SUGAR 6 TIMES DAILY BEFORE MEALS. DX CODE- E11.65   ACCU-CHEK AVIVA  PLUS w/Device Kit USE TO CHECK BLOOD SUGAR 6 TIMES PER DAY DX CODE E11.65   accu-chek multiclix lancets Use to test blood sugar 6 times daily- Dx code- E11.65   aspirin 81 MG EC tablet Take 1 tablet (81 mg total) by mouth daily.   atorvastatin 80 MG tablet Commonly known as:  LIPITOR Take 1 tablet (80 mg total) by mouth daily.   BD INSULIN SYRINGE ULTRAFINE 31G X 15/64" 0.3 ML Misc Generic drug:  Insulin Syringe-Needle U-100 USE TO IJNECT INSULIN 3 TIMES DAILY.   gabapentin 300 MG capsule Commonly known as:  NEURONTIN Take 2 capsules (600 mg total) by mouth 2 (two) times daily.   HUMALOG KWIKPEN 200 UNIT/ML Sopn Generic drug:  Insulin Lispro INJECT 80 TO 100 UNITS 3 TIMES A DAY DEPENDING ON MEAL SIZE   hydrocerin Crea Apply 1 application topically 3 (three) times daily. legs   insulin glargine 100 UNIT/ML injection Commonly known as:  LANTUS Inject 0.5 mLs (50 Units total) into the skin 2 (two) times daily.   Insulin Pen Needle 31G X 5 MM Misc Use to inject insulin 4 times daily   insulin regular human CONCENTRATED 500 UNIT/ML injection Commonly known as:  HUMULIN R INJECT 0.56 MILLILITERS INTO V-GO PUMP DAILY, Dx Code E11.65   KLOR-CON M20 20 MEQ tablet Generic drug:  potassium chloride SA TAKE 2 TABLETS BY MOUTH TWICE A DAY   metolazone 5 MG tablet Commonly known as:  ZAROXOLYN Take 68m twice weekly on Mondays and Fridays.   metoprolol tartrate 50 MG tablet Commonly known as:  LOPRESSOR TAKE 1 TABLET (50 MG TOTAL) BY MOUTH 2 (TWO) TIMES DAILY.   multivitamin with minerals Tabs tablet Take 1 tablet by mouth daily.   mupirocin cream 2 % Commonly known as:  BACTROBAN APPLY TOPICALLY 2 TIMES DAILY   NATURAL BALANCE TEARS OP Place 1 drop into both eyes daily as needed (for dry eyes).   Oxycodone HCl 20 MG Tabs Take 1 tablet (20 mg total) by mouth every 6 (six) hours as needed for severe pain.   spironolactone 25 MG tablet Commonly known as:   ALDACTONE Take 25 mg by mouth daily.   torsemide 20 MG tablet Commonly known as:  DEMADEX Take 4 tablets (80 mg total) by mouth 2 (two) times daily.   XARELTO 20 MG Tabs tablet Generic drug:  rivaroxaban TAKE 1 TABLET BY MOUTH EVERY DAY WITH SUPPER  Allergies: No Known Allergies  Past Medical History:  Diagnosis Date  . Arthritis    "hands and lower back" (09/19/2014)  . CAD (coronary artery disease)    a. s/p PCI to RCA in 2012. b. prior cath in 01/2014 with elevated L/RH pressures, mild-mod CAD of LAD/LCx with patent RCA, normal EF, c/b CIN/CHF.  [name] Carpal tunnel syndrome, bilateral   . Cellulitis   . Chronic diastolic CHF (congestive heart failure) ([place])   . Chronic kidney disease (CKD), stage III (moderate)   . Chronic lower back pain   . History of blood transfusion ~ 1954   "related to OR"  . Hyperlipidemia   . Hypertension   . Hypoxia    a. Qualified for home O2 at [place] in 09/2014.  [name] Lower GI bleed   . Microcytic anemia   . Morbid obesity ([place])   . Neuropathy   . OSA (obstructive sleep apnea)    "I wear nasal prongs; haven't been using prongs recently" (09/19/2014)  . PAF (paroxysmal atrial fibrillation) ([place])    TEE DCCV 09/23/2014  . Physical deconditioning   . Pilonidal cyst 1980's; 01/25/2013  . Scrotal abscess   . Type II diabetes mellitus ([place])     Past Surgical History:  Procedure Laterality Date  . ABDOMINAL SURGERY  ~ 1954   BENIGN TUMOR REMOVED  . APPENDECTOMY    . CARDIOVERSION  2010   Archie Endo 09/19/2014  . CARDIOVERSION N/A 09/23/2014   Procedure: CARDIOVERSION;  Surgeon: Lelon Perla, MD;  Location: [facility] ENDOSCOPY;  Service: Cardiovascular;  Laterality: N/A;  . CARDIOVERSION N/A 12/11/2014   Procedure: CARDIOVERSION;  Surgeon: Sanda Klein, MD;  Location: [place] ENDOSCOPY;  Service: Cardiovascular;  Laterality: N/A;  . CATARACT EXTRACTION W/PHACO Right 11/15/2012   Procedure: CATARACT EXTRACTION PHACO AND INTRAOCULAR LENS PLACEMENT ([place]);  Surgeon:  Adonis Brook, MD;  Location: [place];  Service: Ophthalmology;  Laterality: Right;  . CATARACT EXTRACTION W/PHACO Left 11/29/2012   Procedure: CATARACT EXTRACTION PHACO AND INTRAOCULAR LENS PLACEMENT (IOC);  Surgeon: Adonis Brook, MD;  Location: [place];  Service: Ophthalmology;  Laterality: Left;  . CORONARY ANGIOPLASTY WITH STENT PLACEMENT  August 2012   RCA DES - [facility]  . DEBRIDEMENT  FOOT Left    debriding diabetic foot ulcers  . EYE SURGERY    . FOREIGN BODY REMOVAL Right 2014   heel,  splinter removal   . LEFT AND RIGHT HEART CATHETERIZATION WITH CORONARY ANGIOGRAM N/A 01/31/2014   Procedure: LEFT AND RIGHT HEART CATHETERIZATION WITH CORONARY ANGIOGRAM;  Surgeon: Blane Ohara, MD;  Location: [facility] CATH LAB;  Service: Cardiovascular;  Laterality: N/A;  . PARS PLANA VITRECTOMY Left 06/05/2013   Procedure: PARS PLANA VITRECTOMY WITH 23 GAUGE with Endolaser(constellation);  Surgeon: Adonis Brook, MD;  Location: [place];  Service: Ophthalmology;  Laterality: Left;  . PILONIDAL CYST EXCISION N/A 01/08/2013   Procedure: CYST EXCISION PILONIDAL EXTENSIVE;  Surgeon: Ralene Ok, MD;  Location: [place];  Service: General;  Laterality: N/A;  . PILONIDAL CYST EXCISION  1980's   "in [place]"  . TEE WITHOUT CARDIOVERSION N/A 09/23/2014   Procedure: TRANSESOPHAGEAL ECHOCARDIOGRAM (TEE);  Surgeon: Lelon Perla, MD;  Location[facility] ENDOSCOPY;  Service: Cardiovascular;  Laterality: N/A;  . TONSILLECTOMY      Family History  Problem Relation Age of Onset  . Adopted: Yes  . Other Other        PT ADOPTED    Social History:  reports that he quit smoking  about 33 years ago. His smoking use included Cigarettes. He has a 20.00 pack-year smoking history. He has never used smokeless tobacco. He reports that he does not drink alcohol or use drugs.   Review of Systems   Lipid history: On 80 mg Lipitor, has history of CAD    Lab Results  Component Value Date   CHOL 127  03/03/2016   HDL 28.10 (L) 03/03/2016   LDLCALC 65 03/03/2016   LDLDIRECT 82.1 08/31/2011   TRIG 170.0 (H) 03/03/2016   CHOLHDL 5 03/03/2016           Hypertension: Present for several years, Not on any medications currently except metoprolol and diuretics  On Demadex 20 mg for history of CHF along with Aldactone 12.5 mg and Zaroxolyn   He appears to have Recurrent hypokalemia   Lab Results  Component Value Date   CREATININE 1.37 12/21/2016   BUN 22 12/21/2016   NA 136 12/21/2016   K 3.3 (L) 12/21/2016   CL 87 (L) 12/21/2016   CO2 39 (H) 12/21/2016      Most recent eye exam was In 04/2016  Most recent foot exam: 8/17  Lab Results  Component Value Date   TSH 0.687 06/14/2016     LABS:  Office Visit on 12/21/2016  Component Date Value Ref Range Status  . Hgb A1c MFr Bld 12/21/2016 8.2* 4.6 - 6.5 % Final   Glycemic Control Guidelines for People with Diabetes:Non Diabetic:  <6%Goal of Therapy: <7%Additional Action Suggested:  >8%   . Sodium 12/21/2016 136  135 - 145 mEq/L Final  . Potassium 12/21/2016 3.3* 3.5 - 5.1 mEq/L Final  . Chloride 12/21/2016 87* 96 - 112 mEq/L Final  . CO2 12/21/2016 39* 19 - 32 mEq/L Final  . Glucose, Bld 12/21/2016 56* 70 - 99 mg/dL Final  . BUN 12/21/2016 22  6 - 23 mg/dL Final  . Creatinine, Ser 12/21/2016 1.37  0.40 - 1.50 mg/dL Final  . Total Bilirubin 12/21/2016 0.5  0.2 - 1.2 mg/dL Final  . Alkaline Phosphatase 12/21/2016 82  39 - 117 U/L Final  . AST 12/21/2016 16  0 - 37 U/L Final  . ALT 12/21/2016 14  0 - 53 U/L Final  . Total Protein 12/21/2016 7.4  6.0 - 8.3 g/dL Final  . Albumin 12/21/2016 3.3* 3.5 - 5.2 g/dL Final  . Calcium 12/21/2016 9.6  8.4 - 10.5 mg/dL Final  . GFR 12/21/2016 55.40* >60.00 mL/min Final    Physical Examination:  BP 138/78   Pulse 97   Ht 6' (1.829 m)   Wt (!) 355 lb 12.8 oz (161.4 kg)   SpO2 94%   BMI 48.26 kg/m          ASSESSMENT:  Diabetes type 2, uncontrolled  See history of present  illness for detailed discussion of current diabetes management, blood sugar patterns and problems identified  His A1c is consistently around 8 He has poor understanding of use of mealtime insulin and is mixing randomly Humalog and the U-500 His insulin timing is inappropriate at times with postprandial injections Also overnight he gets hypoglycemia now because of longer action of Humalog and also taking excessive correction doses at night He does need to get better coverage of his evening meal to prevent blood sugars going up to around 400 Also likely needs better adjustment of his meal planning and more diabetes education  Although he may benefit from use of in SGLT 2 drug his renal function is not consistently  normal  HYPOKALEMIA: He needs to follow-up with cardiologist  Recommendations:  He will stop Humalog  He will take U-500 insulin 10 units in the mornings when he is not eating breakfast  He will take 20 units if he is eating breakfast and lunch and also 30 units at suppertime for now  He will increase morning Lantus to 60 and reduce evening dose to 30  Take the Humulin R 30 min before eating  He will need to follow-up in 6 weeks and also consider the V-go pump again if not evenly controlled  More diabetes education will be arranged  Consider [place] if having post prandial hyperglycemia at least at suppertime  Patient Instructions  U-500 insulin: if not eating a meal take 10 units  Take 30 min before meals  For Bfst and lunch take 20 units and supper 30 units  Lantus 60 in am and 30 in pm  Counseling time on subjects discussed above is over 50% of today's 25 minute visit   Jaala Bohle 12/23/2016, 8:54 PM   Note: This office note was prepared with Dragon voice recognition system technology. Any transcriptional errors that result from this process are unintentional.

## 2016-12-23 ENCOUNTER — Ambulatory Visit (INDEPENDENT_AMBULATORY_CARE_PROVIDER_SITE_OTHER): Payer: Medicare Other | Admitting: Endocrinology

## 2016-12-23 ENCOUNTER — Encounter: Payer: Self-pay | Admitting: Endocrinology

## 2016-12-23 VITALS — BP 138/78 | HR 97 | Ht 72.0 in | Wt 355.8 lb

## 2016-12-23 DIAGNOSIS — E1165 Type 2 diabetes mellitus with hyperglycemia: Secondary | ICD-10-CM

## 2016-12-23 DIAGNOSIS — Z794 Long term (current) use of insulin: Secondary | ICD-10-CM | POA: Diagnosis not present

## 2016-12-23 DIAGNOSIS — E876 Hypokalemia: Secondary | ICD-10-CM

## 2016-12-23 NOTE — Patient Instructions (Addendum)
U-500 insulin: if not eating a meal take 10 units  Take 30 min before meals  For Bfst and lunch take 20 units and supper 30 units  Lantus 60 in am and 30 in pm

## 2016-12-24 DIAGNOSIS — G4733 Obstructive sleep apnea (adult) (pediatric): Secondary | ICD-10-CM | POA: Diagnosis not present

## 2016-12-25 ENCOUNTER — Other Ambulatory Visit: Payer: Self-pay | Admitting: Endocrinology

## 2016-12-25 DIAGNOSIS — E162 Hypoglycemia, unspecified: Secondary | ICD-10-CM | POA: Diagnosis not present

## 2016-12-25 DIAGNOSIS — E161 Other hypoglycemia: Secondary | ICD-10-CM | POA: Diagnosis not present

## 2016-12-29 ENCOUNTER — Ambulatory Visit: Payer: Medicare Other | Admitting: Podiatry

## 2017-01-05 ENCOUNTER — Ambulatory Visit: Payer: Medicare Other | Admitting: Podiatry

## 2017-01-06 DIAGNOSIS — M4726 Other spondylosis with radiculopathy, lumbar region: Secondary | ICD-10-CM | POA: Diagnosis not present

## 2017-01-06 DIAGNOSIS — G894 Chronic pain syndrome: Secondary | ICD-10-CM | POA: Diagnosis not present

## 2017-01-06 DIAGNOSIS — Z79891 Long term (current) use of opiate analgesic: Secondary | ICD-10-CM | POA: Diagnosis not present

## 2017-01-06 DIAGNOSIS — E1142 Type 2 diabetes mellitus with diabetic polyneuropathy: Secondary | ICD-10-CM | POA: Diagnosis not present

## 2017-01-10 ENCOUNTER — Other Ambulatory Visit: Payer: Self-pay | Admitting: Anesthesiology

## 2017-01-10 DIAGNOSIS — T1490XA Injury, unspecified, initial encounter: Secondary | ICD-10-CM

## 2017-01-13 ENCOUNTER — Other Ambulatory Visit: Payer: Self-pay | Admitting: Internal Medicine

## 2017-01-14 ENCOUNTER — Other Ambulatory Visit: Payer: Self-pay | Admitting: Cardiology

## 2017-01-19 ENCOUNTER — Ambulatory Visit (INDEPENDENT_AMBULATORY_CARE_PROVIDER_SITE_OTHER): Payer: Medicare Other | Admitting: Orthopedic Surgery

## 2017-01-21 DIAGNOSIS — T148XXA Other injury of unspecified body region, initial encounter: Secondary | ICD-10-CM | POA: Diagnosis not present

## 2017-01-21 DIAGNOSIS — M25569 Pain in unspecified knee: Secondary | ICD-10-CM | POA: Diagnosis not present

## 2017-01-22 ENCOUNTER — Inpatient Hospital Stay (HOSPITAL_COMMUNITY): Payer: Medicare Other

## 2017-01-22 ENCOUNTER — Inpatient Hospital Stay (HOSPITAL_COMMUNITY)
Admission: EM | Admit: 2017-01-22 | Discharge: 2017-01-25 | DRG: 871 | Disposition: A | Discharge status: Skilled Nursing Facility | Payer: Medicare Other | Attending: Internal Medicine | Admitting: Internal Medicine

## 2017-01-22 ENCOUNTER — Emergency Department (HOSPITAL_COMMUNITY): Payer: Medicare Other

## 2017-01-22 ENCOUNTER — Encounter (HOSPITAL_COMMUNITY): Payer: Self-pay | Admitting: Nurse Practitioner

## 2017-01-22 DIAGNOSIS — I4891 Unspecified atrial fibrillation: Secondary | ICD-10-CM | POA: Diagnosis not present

## 2017-01-22 DIAGNOSIS — Z6841 Body Mass Index (BMI) 40.0 and over, adult: Secondary | ICD-10-CM | POA: Diagnosis not present

## 2017-01-22 DIAGNOSIS — I5040 Unspecified combined systolic (congestive) and diastolic (congestive) heart failure: Secondary | ICD-10-CM | POA: Diagnosis not present

## 2017-01-22 DIAGNOSIS — R0602 Shortness of breath: Secondary | ICD-10-CM | POA: Diagnosis not present

## 2017-01-22 DIAGNOSIS — E1165 Type 2 diabetes mellitus with hyperglycemia: Secondary | ICD-10-CM | POA: Diagnosis present

## 2017-01-22 DIAGNOSIS — R600 Localized edema: Secondary | ICD-10-CM | POA: Diagnosis not present

## 2017-01-22 DIAGNOSIS — Z9181 History of falling: Secondary | ICD-10-CM | POA: Diagnosis not present

## 2017-01-22 DIAGNOSIS — M549 Dorsalgia, unspecified: Secondary | ICD-10-CM | POA: Diagnosis not present

## 2017-01-22 DIAGNOSIS — J9691 Respiratory failure, unspecified with hypoxia: Secondary | ICD-10-CM | POA: Diagnosis not present

## 2017-01-22 DIAGNOSIS — E1122 Type 2 diabetes mellitus with diabetic chronic kidney disease: Secondary | ICD-10-CM | POA: Diagnosis present

## 2017-01-22 DIAGNOSIS — I130 Hypertensive heart and chronic kidney disease with heart failure and stage 1 through stage 4 chronic kidney disease, or unspecified chronic kidney disease: Secondary | ICD-10-CM | POA: Diagnosis not present

## 2017-01-22 DIAGNOSIS — Z9981 Dependence on supplemental oxygen: Secondary | ICD-10-CM

## 2017-01-22 DIAGNOSIS — I482 Chronic atrial fibrillation: Secondary | ICD-10-CM | POA: Diagnosis not present

## 2017-01-22 DIAGNOSIS — Z7982 Long term (current) use of aspirin: Secondary | ICD-10-CM

## 2017-01-22 DIAGNOSIS — E1142 Type 2 diabetes mellitus with diabetic polyneuropathy: Secondary | ICD-10-CM | POA: Diagnosis present

## 2017-01-22 DIAGNOSIS — E1121 Type 2 diabetes mellitus with diabetic nephropathy: Secondary | ICD-10-CM | POA: Diagnosis not present

## 2017-01-22 DIAGNOSIS — Z7901 Long term (current) use of anticoagulants: Secondary | ICD-10-CM | POA: Diagnosis not present

## 2017-01-22 DIAGNOSIS — E1322 Other specified diabetes mellitus with diabetic chronic kidney disease: Secondary | ICD-10-CM | POA: Diagnosis not present

## 2017-01-22 DIAGNOSIS — R2689 Other abnormalities of gait and mobility: Secondary | ICD-10-CM | POA: Diagnosis not present

## 2017-01-22 DIAGNOSIS — L03116 Cellulitis of left lower limb: Secondary | ICD-10-CM | POA: Diagnosis present

## 2017-01-22 DIAGNOSIS — L03115 Cellulitis of right lower limb: Secondary | ICD-10-CM | POA: Diagnosis present

## 2017-01-22 DIAGNOSIS — M1711 Unilateral primary osteoarthritis, right knee: Secondary | ICD-10-CM | POA: Diagnosis not present

## 2017-01-22 DIAGNOSIS — I2510 Atherosclerotic heart disease of native coronary artery without angina pectoris: Secondary | ICD-10-CM | POA: Diagnosis not present

## 2017-01-22 DIAGNOSIS — I5033 Acute on chronic diastolic (congestive) heart failure: Secondary | ICD-10-CM

## 2017-01-22 DIAGNOSIS — G4733 Obstructive sleep apnea (adult) (pediatric): Secondary | ICD-10-CM | POA: Diagnosis not present

## 2017-01-22 DIAGNOSIS — M170 Bilateral primary osteoarthritis of knee: Secondary | ICD-10-CM | POA: Diagnosis present

## 2017-01-22 DIAGNOSIS — I5032 Chronic diastolic (congestive) heart failure: Secondary | ICD-10-CM | POA: Diagnosis present

## 2017-01-22 DIAGNOSIS — Z23 Encounter for immunization: Secondary | ICD-10-CM

## 2017-01-22 DIAGNOSIS — D631 Anemia in chronic kidney disease: Secondary | ICD-10-CM | POA: Diagnosis not present

## 2017-01-22 DIAGNOSIS — M11262 Other chondrocalcinosis, left knee: Secondary | ICD-10-CM | POA: Diagnosis present

## 2017-01-22 DIAGNOSIS — N183 Chronic kidney disease, stage 3 (moderate): Secondary | ICD-10-CM | POA: Diagnosis not present

## 2017-01-22 DIAGNOSIS — R2681 Unsteadiness on feet: Secondary | ICD-10-CM | POA: Diagnosis not present

## 2017-01-22 DIAGNOSIS — A419 Sepsis, unspecified organism: Principal | ICD-10-CM | POA: Diagnosis present

## 2017-01-22 DIAGNOSIS — E11319 Type 2 diabetes mellitus with unspecified diabetic retinopathy without macular edema: Secondary | ICD-10-CM | POA: Diagnosis not present

## 2017-01-22 DIAGNOSIS — M11261 Other chondrocalcinosis, right knee: Secondary | ICD-10-CM | POA: Diagnosis not present

## 2017-01-22 DIAGNOSIS — G8929 Other chronic pain: Secondary | ICD-10-CM | POA: Diagnosis not present

## 2017-01-22 DIAGNOSIS — Z794 Long term (current) use of insulin: Secondary | ICD-10-CM

## 2017-01-22 DIAGNOSIS — E11649 Type 2 diabetes mellitus with hypoglycemia without coma: Secondary | ICD-10-CM | POA: Diagnosis not present

## 2017-01-22 DIAGNOSIS — L0390 Cellulitis, unspecified: Secondary | ICD-10-CM | POA: Diagnosis not present

## 2017-01-22 DIAGNOSIS — I5030 Unspecified diastolic (congestive) heart failure: Secondary | ICD-10-CM | POA: Diagnosis not present

## 2017-01-22 DIAGNOSIS — N179 Acute kidney failure, unspecified: Secondary | ICD-10-CM | POA: Diagnosis not present

## 2017-01-22 DIAGNOSIS — Z955 Presence of coronary angioplasty implant and graft: Secondary | ICD-10-CM

## 2017-01-22 DIAGNOSIS — M6282 Rhabdomyolysis: Secondary | ICD-10-CM | POA: Diagnosis not present

## 2017-01-22 DIAGNOSIS — K922 Gastrointestinal hemorrhage, unspecified: Secondary | ICD-10-CM | POA: Diagnosis not present

## 2017-01-22 DIAGNOSIS — J9611 Chronic respiratory failure with hypoxia: Secondary | ICD-10-CM | POA: Diagnosis not present

## 2017-01-22 DIAGNOSIS — E785 Hyperlipidemia, unspecified: Secondary | ICD-10-CM | POA: Diagnosis present

## 2017-01-22 DIAGNOSIS — R9431 Abnormal electrocardiogram [ECG] [EKG]: Secondary | ICD-10-CM | POA: Diagnosis not present

## 2017-01-22 DIAGNOSIS — M6281 Muscle weakness (generalized): Secondary | ICD-10-CM | POA: Diagnosis not present

## 2017-01-22 DIAGNOSIS — M1712 Unilateral primary osteoarthritis, left knee: Secondary | ICD-10-CM | POA: Diagnosis not present

## 2017-01-22 DIAGNOSIS — I10 Essential (primary) hypertension: Secondary | ICD-10-CM | POA: Diagnosis not present

## 2017-01-22 DIAGNOSIS — N492 Inflammatory disorders of scrotum: Secondary | ICD-10-CM | POA: Diagnosis not present

## 2017-01-22 LAB — CBC WITH DIFFERENTIAL/PLATELET
BASOS PCT: 0 %
Basophils Absolute: 0 10*3/uL (ref 0.0–0.1)
EOS ABS: 0.2 10*3/uL (ref 0.0–0.7)
EOS PCT: 1 %
HCT: 26.9 % — ABNORMAL LOW (ref 39.0–52.0)
Hemoglobin: 8.4 g/dL — ABNORMAL LOW (ref 13.0–17.0)
LYMPHS ABS: 1.9 10*3/uL (ref 0.7–4.0)
Lymphocytes Relative: 12 %
MCH: 22.8 pg — AB (ref 26.0–34.0)
MCHC: 31.2 g/dL (ref 30.0–36.0)
MCV: 73.1 fL — ABNORMAL LOW (ref 78.0–100.0)
Monocytes Absolute: 1.6 10*3/uL — ABNORMAL HIGH (ref 0.1–1.0)
Monocytes Relative: 10 %
NEUTROS PCT: 77 %
Neutro Abs: 12.3 10*3/uL — ABNORMAL HIGH (ref 1.7–7.7)
Platelets: 350 10*3/uL (ref 150–400)
RBC: 3.68 MIL/uL — AB (ref 4.22–5.81)
RDW: 18.6 % — ABNORMAL HIGH (ref 11.5–15.5)
WBC: 16 10*3/uL — AB (ref 4.0–10.5)

## 2017-01-22 LAB — COMPREHENSIVE METABOLIC PANEL
ALBUMIN: 2.7 g/dL — AB (ref 3.5–5.0)
ALT: 13 U/L — AB (ref 17–63)
ANION GAP: 10 (ref 5–15)
AST: 23 U/L (ref 15–41)
Alkaline Phosphatase: 66 U/L (ref 38–126)
BUN: 21 mg/dL — ABNORMAL HIGH (ref 6–20)
CO2: 32 mmol/L (ref 22–32)
Calcium: 8 mg/dL — ABNORMAL LOW (ref 8.9–10.3)
Chloride: 91 mmol/L — ABNORMAL LOW (ref 101–111)
Creatinine, Ser: 1.34 mg/dL — ABNORMAL HIGH (ref 0.61–1.24)
GFR calc non Af Amer: 54 mL/min — ABNORMAL LOW (ref >60)
GLUCOSE: 398 mg/dL — AB (ref 65–99)
Potassium: 3.8 mmol/L (ref 3.5–5.1)
SODIUM: 133 mmol/L — AB (ref 135–145)
Total Bilirubin: 1.3 mg/dL — ABNORMAL HIGH (ref 0.3–1.2)
Total Protein: 6.6 g/dL (ref 6.5–8.1)

## 2017-01-22 LAB — GLUCOSE, CAPILLARY
GLUCOSE-CAPILLARY: 539 mg/dL — AB (ref 65–99)
GLUCOSE-CAPILLARY: 442 mg/dL — AB (ref 65–99)
Glucose-Capillary: 317 mg/dL — ABNORMAL HIGH (ref 65–99)

## 2017-01-22 LAB — URINALYSIS, ROUTINE W REFLEX MICROSCOPIC
BILIRUBIN URINE: NEGATIVE
Bacteria, UA: NONE SEEN
HGB URINE DIPSTICK: NEGATIVE
Ketones, ur: NEGATIVE mg/dL
Leukocytes, UA: NEGATIVE
NITRITE: NEGATIVE
PROTEIN: NEGATIVE mg/dL
Specific Gravity, Urine: 1.011 (ref 1.005–1.030)
Squamous Epithelial / LPF: NONE SEEN
pH: 5 (ref 5.0–8.0)

## 2017-01-22 LAB — ECHOCARDIOGRAM COMPLETE
AO mean calculated velocity dopler: 64.9 cm/s
AV Peak grad: 3 mmHg
AVG: 2 mmHg
AVPKVEL: 85.7 cm/s
Ao-asc: 28 cm
CHL CUP MV DEC (S): 194
E decel time: 194 msec
FS: 7 % — AB (ref 28–44)
HEIGHTINCHES: 72 in
IVS/LV PW RATIO, ED: 0.99
LA ID, A-P, ES: 40 mm
LADIAMINDEX: 1.46 cm/m2
LEFT ATRIUM END SYS DIAM: 40 mm
LV PW d: 14.6 mm — AB (ref 0.6–1.1)
LVOT area: 3.46 cm2
LVOT diameter: 21 mm
MV Peak grad: 8 mmHg
MVPKEVEL: 139 m/s
VTI: 14.5 cm
Weight: 5798.98 oz

## 2017-01-22 LAB — CBG MONITORING, ED
Glucose-Capillary: 379 mg/dL — ABNORMAL HIGH (ref 65–99)
Glucose-Capillary: 374 mg/dL — ABNORMAL HIGH (ref 65–99)

## 2017-01-22 LAB — BASIC METABOLIC PANEL
ANION GAP: 9 (ref 5–15)
BUN: 21 mg/dL — ABNORMAL HIGH (ref 6–20)
CALCIUM: 7.9 mg/dL — AB (ref 8.9–10.3)
CO2: 32 mmol/L (ref 22–32)
Chloride: 91 mmol/L — ABNORMAL LOW (ref 101–111)
Creatinine, Ser: 1.35 mg/dL — ABNORMAL HIGH (ref 0.61–1.24)
GFR, EST NON AFRICAN AMERICAN: 54 mL/min — AB (ref >60)
GLUCOSE: 536 mg/dL — AB (ref 65–99)
Potassium: 4.1 mmol/L (ref 3.5–5.1)
SODIUM: 132 mmol/L — AB (ref 135–145)

## 2017-01-22 LAB — BRAIN NATRIURETIC PEPTIDE: B Natriuretic Peptide: 183.2 pg/mL — ABNORMAL HIGH (ref 0.0–100.0)

## 2017-01-22 LAB — GLUCOSE, RANDOM: Glucose, Bld: 476 mg/dL — ABNORMAL HIGH (ref 65–99)

## 2017-01-22 LAB — HIV ANTIBODY (ROUTINE TESTING W REFLEX): HIV SCREEN 4TH GENERATION: NONREACTIVE

## 2017-01-22 MED ORDER — DEXTROSE 5 % IV SOLN
2.0000 g | INTRAVENOUS | Status: DC
  Administered 2017-01-22 – 2017-01-24 (×3): 2 g via INTRAVENOUS
  Filled 2017-01-22 (×4): qty 2

## 2017-01-22 MED ORDER — METOPROLOL TARTRATE 50 MG PO TABS
50.0000 mg | ORAL_TABLET | Freq: Two times a day (BID) | ORAL | Status: DC
  Administered 2017-01-22 – 2017-01-25 (×7): 50 mg via ORAL
  Filled 2017-01-22 (×7): qty 1

## 2017-01-22 MED ORDER — OXYCODONE HCL 5 MG PO TABS
20.0000 mg | ORAL_TABLET | Freq: Four times a day (QID) | ORAL | Status: DC | PRN
  Administered 2017-01-22 – 2017-01-23 (×5): 30 mg via ORAL
  Filled 2017-01-22 (×5): qty 6

## 2017-01-22 MED ORDER — INSULIN GLARGINE 100 UNIT/ML ~~LOC~~ SOLN
30.0000 [IU] | Freq: Every day | SUBCUTANEOUS | Status: DC
  Administered 2017-01-22: 30 [IU] via SUBCUTANEOUS
  Filled 2017-01-22 (×2): qty 0.3

## 2017-01-22 MED ORDER — SODIUM CHLORIDE 0.9 % IV BOLUS (SEPSIS)
500.0000 mL | Freq: Once | INTRAVENOUS | Status: AC
  Administered 2017-01-22: 500 mL via INTRAVENOUS

## 2017-01-22 MED ORDER — INSULIN ASPART 100 UNIT/ML ~~LOC~~ SOLN
0.0000 [IU] | Freq: Every day | SUBCUTANEOUS | Status: DC
  Administered 2017-01-22 – 2017-01-23 (×2): 4 [IU] via SUBCUTANEOUS
  Administered 2017-01-24: 5 [IU] via SUBCUTANEOUS

## 2017-01-22 MED ORDER — FUROSEMIDE 10 MG/ML IJ SOLN
80.0000 mg | Freq: Two times a day (BID) | INTRAMUSCULAR | Status: DC
  Administered 2017-01-22 – 2017-01-25 (×7): 80 mg via INTRAVENOUS
  Filled 2017-01-22 (×7): qty 8

## 2017-01-22 MED ORDER — INSULIN ASPART 100 UNIT/ML ~~LOC~~ SOLN
6.0000 [IU] | Freq: Three times a day (TID) | SUBCUTANEOUS | Status: DC
  Administered 2017-01-22 – 2017-01-25 (×9): 6 [IU] via SUBCUTANEOUS

## 2017-01-22 MED ORDER — PNEUMOCOCCAL VAC POLYVALENT 25 MCG/0.5ML IJ INJ
0.5000 mL | INJECTION | INTRAMUSCULAR | Status: AC
  Administered 2017-01-23: 0.5 mL via INTRAMUSCULAR
  Filled 2017-01-22: qty 0.5

## 2017-01-22 MED ORDER — SPIRONOLACTONE 25 MG PO TABS
25.0000 mg | ORAL_TABLET | Freq: Every day | ORAL | Status: DC
  Administered 2017-01-22 – 2017-01-25 (×4): 25 mg via ORAL
  Filled 2017-01-22 (×4): qty 1

## 2017-01-22 MED ORDER — OXYCODONE HCL 5 MG PO TABS
20.0000 mg | ORAL_TABLET | Freq: Once | ORAL | Status: AC
  Administered 2017-01-22: 20 mg via ORAL
  Filled 2017-01-22: qty 4

## 2017-01-22 MED ORDER — INSULIN GLARGINE 100 UNIT/ML ~~LOC~~ SOLN: 30.0000 [IU] | Freq: Two times a day (BID) | SUBCUTANEOUS | Status: DC

## 2017-01-22 MED ORDER — RIVAROXABAN 20 MG PO TABS
20.0000 mg | ORAL_TABLET | Freq: Every day | ORAL | Status: DC
  Administered 2017-01-22 – 2017-01-25 (×4): 20 mg via ORAL
  Filled 2017-01-22 (×5): qty 1

## 2017-01-22 MED ORDER — VANCOMYCIN HCL IN DEXTROSE 1-5 GM/200ML-% IV SOLN
1000.0000 mg | Freq: Once | INTRAVENOUS | Status: AC
  Administered 2017-01-22: 1000 mg via INTRAVENOUS
  Filled 2017-01-22: qty 200

## 2017-01-22 MED ORDER — SODIUM CHLORIDE 0.9% FLUSH
3.0000 mL | Freq: Two times a day (BID) | INTRAVENOUS | Status: DC
  Administered 2017-01-22 – 2017-01-25 (×6): 3 mL via INTRAVENOUS

## 2017-01-22 MED ORDER — PERFLUTREN LIPID MICROSPHERE
1.0000 mL | INTRAVENOUS | Status: AC | PRN
  Administered 2017-01-22: 2 mL via INTRAVENOUS
  Filled 2017-01-22: qty 10

## 2017-01-22 MED ORDER — ATORVASTATIN CALCIUM 40 MG PO TABS
80.0000 mg | ORAL_TABLET | Freq: Every day | ORAL | Status: DC
  Administered 2017-01-22 – 2017-01-25 (×4): 80 mg via ORAL
  Filled 2017-01-22 (×4): qty 2

## 2017-01-22 MED ORDER — ASPIRIN EC 81 MG PO TBEC
81.0000 mg | DELAYED_RELEASE_TABLET | Freq: Every day | ORAL | Status: DC
  Administered 2017-01-22 – 2017-01-25 (×4): 81 mg via ORAL
  Filled 2017-01-22 (×4): qty 1

## 2017-01-22 MED ORDER — INSULIN GLARGINE 100 UNIT/ML ~~LOC~~ SOLN
60.0000 [IU] | Freq: Every day | SUBCUTANEOUS | Status: DC
  Administered 2017-01-22 – 2017-01-23 (×2): 60 [IU] via SUBCUTANEOUS
  Filled 2017-01-22 (×2): qty 0.6

## 2017-01-22 MED ORDER — MORPHINE SULFATE (PF) 4 MG/ML IV SOLN
4.0000 mg | Freq: Once | INTRAVENOUS | Status: AC
  Administered 2017-01-22: 4 mg via INTRAVENOUS
  Filled 2017-01-22: qty 1

## 2017-01-22 MED ORDER — GABAPENTIN 300 MG PO CAPS
600.0000 mg | ORAL_CAPSULE | Freq: Two times a day (BID) | ORAL | Status: DC
  Administered 2017-01-22 – 2017-01-25 (×7): 600 mg via ORAL
  Filled 2017-01-22 (×8): qty 2

## 2017-01-22 MED ORDER — INSULIN ASPART 100 UNIT/ML ~~LOC~~ SOLN
0.0000 [IU] | Freq: Three times a day (TID) | SUBCUTANEOUS | Status: DC
  Administered 2017-01-22 (×2): 20 [IU] via SUBCUTANEOUS
  Administered 2017-01-23: 7 [IU] via SUBCUTANEOUS
  Administered 2017-01-23: 11 [IU] via SUBCUTANEOUS
  Administered 2017-01-23: 7 [IU] via SUBCUTANEOUS
  Administered 2017-01-24: 4 [IU] via SUBCUTANEOUS
  Administered 2017-01-24: 11 [IU] via SUBCUTANEOUS
  Administered 2017-01-24 – 2017-01-25 (×2): 20 [IU] via SUBCUTANEOUS
  Administered 2017-01-25: 15 [IU] via SUBCUTANEOUS

## 2017-01-22 NOTE — ED Notes (Signed)
Contact made with labs to f/u with resulting. Lab tech Rodney Booze stated that she did not have blood, and stated that with morning draws lab may get sent to another floor. Blood specimens were obtained a second time from pt.

## 2017-01-22 NOTE — Progress Notes (Signed)
OT Cancellation Note  Patient Details Name: [name] MRN: [id number] DOB: November 01, 1950   Cancelled Treatment:    Reason Eval/Treat Not Completed: Patient at procedure or test/ unavailable  Pt having ECHO- will check on pt later in day or next day Lise Auer, [place] [phone]  Einar Crow D 01/22/2017, 1:53 PM

## 2017-01-22 NOTE — Progress Notes (Signed)
  Echocardiogram 2D Echocardiogram has been performed.  Janalyn Harder 01/22/2017, 1:51 PM

## 2017-01-22 NOTE — Progress Notes (Signed)
This is a no charge note  Pending admission per PA, Upstill  66 year old male with past medical history of hypertension, hyperlipidemia, diabetes mellitus, per for on Xarelto, GI bleeding, CKD-3,d CHF, CAD, morbid obesity, who presents with fall and bilateral knee pain, but no fracture by knee x-ray. Patient was found to have possible bilateral lower leg cellulitis, IV vancomycin was started. WBC 16.0, temperature normal. Patient has SOB and may also have CHF exacerbation. Pending BMP and  CXR. Pt is admitted to tele bed as inpt. May need placement for rehab.    Lorretta Harp, MD  [name] Pager [phone]  If 7PM-7AM, please contact night-coverage [email or web address] Password [facility] 01/22/2017, 6:56 AM

## 2017-01-22 NOTE — ED Provider Notes (Signed)
[facility] DEPT Provider Note   CSN: [id number] Arrival date & time: 01/22/17  0021     History   Chief Complaint Chief Complaint  Patient presents with  . Knee Pain    Bilateral  . Hyperglycemia    Was Unable to Take Insulin Today    HPI [name] is a 66 y.o. male.  Patient presents after fall to knees on a wood floor yesterday (01/21/17) at around noon during an episode of hypoglycemia. He has chronic pain in his left knee followed by orthopedics, however, after the fall the pain became significantly worse and his right knee is also painful. He was not able to get up off the floor and called EMS who came out and put him into his chair. He has not been able to ambulate since that time. He lives with his sister who is gone most of the time. He reports inability to manage ADL, medications or health concerns.    The history is provided by the patient. No language interpreter was used.  Knee Pain    Hyperglycemia  Associated symptoms: shortness of breath and weakness   Associated symptoms: no chest pain, no fever, no nausea and no vomiting     Past Medical History:  Diagnosis Date  . Arthritis    "hands and lower back" (09/19/2014)  . CAD (coronary artery disease)    a. s/p PCI to RCA in 2012. b. prior cath in 01/2014 with elevated L/RH pressures, mild-mod CAD of LAD/LCx with patent RCA, normal EF, c/b CIN/CHF.  [name] Carpal tunnel syndrome, bilateral   . Cellulitis   . Chronic diastolic CHF (congestive heart failure) ([place])   . Chronic kidney disease (CKD), stage III (moderate)   . Chronic lower back pain   . History of blood transfusion ~ 1954   "related to OR"  . Hyperlipidemia   . Hypertension   . Hypoxia    a. Qualified for home O2 at DC in 09/2014.  [name] Lower GI bleed   . Microcytic anemia   . Morbid obesity ([name])   . Neuropathy   . OSA (obstructive sleep apnea)    "I wear nasal prongs; haven't been using prongs recently" (09/19/2014)  . PAF (paroxysmal atrial  fibrillation) ([name])    TEE DCCV 09/23/2014  . Physical deconditioning   . Pilonidal cyst 1980's; 01/25/2013  . Scrotal abscess   . Type II diabetes mellitus [facility])     Patient Active Problem List   Diagnosis Date Noted  . Bilateral lower leg cellulitis 01/22/2017  . Venous stasis 08/06/2016  . At high risk for falls 07/21/2016  . CHF exacerbation ([place]) 06/10/2016  . Lactic acidosis 06/09/2016  . Syncope and collapse 03/05/2016  . Acute on chronic renal insufficiency 03/05/2016  . Obesity 12/30/2015  . Scrotal swelling   . Foot fracture   . 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 Lower GI bleed   . Pressure ulcer 12/24/2015  . Rhabdomyolysis   . Acute kidney injury ([place]) 12/23/2015  . Back pain 12/23/2015  . Fall   . Metatarsal fracture   . Traumatic rhabdomyolysis ([place])   . Hyperkalemia   . Diabetes mellitus type 2 in obese ([name])   . Lisfranc dislocation   . Hyperglycemia   . Sepsis ([place]) 09/08/2015  . Scrotal wall abscess 09/08/2015  . AKI (acute kidney injury) ([place]) 09/08/2015  . Chest wall pain   . On amiodarone therapy 12/08/2014  . Respiratory failure with  hypoxia ([place]) 12/04/2014  . Hypoxia 12/04/2014  . Acute on chronic respiratory failure with hypoxia ([place])   . Atrial fibrillation with RVR ([place]) 09/20/2014  . Acute on chronic diastolic heart failure ([place]) 09/19/2014  . Atrial fibrillation with rapid ventricular response ([place]) 09/19/2014  . Acute respiratory failure ([place]) 09/19/2014  . Abnormal nuclear cardiac imaging test 02/12/2014  . Dyspnea 02/12/2014  . Chronic anticoagulation- Coumadin 02/12/2014  . CKD (chronic kidney disease) stage 3, GFR 30-59 ml/min 02/12/2014  . Chronic diastolic congestive heart failure ([place]) 02/11/2014  . Encounter for therapeutic drug monitoring 08/03/2013  . Pilonidal cyst 01/25/2013  . Perirectal cellulitis 09/06/2012  . Long term (current) use of anticoagulants 11/18/2011  . Obstructive sleep apnea- unable to  tolerate c-pap 08/04/2011  . CAD S/P percutaneous coronary angioplasty 08/04/2011  . Diabetes mellitus type 2, uncontrolled, with complications ([place]) 50/93/2671  . ANEMIA-IRON DEFICIENCY 01/06/2009  . OTHER AND UNSPECIFIED COAGULATION DEFECTS 01/06/2009  . RECTAL BLEEDING 01/06/2009  . THYROID STIMULATING HORMONE, ABNORMAL 01/02/2009  . ANEMIA 01/02/2009  . CARDIOVASCULAR FUNCTION STUDY, ABNORMAL 01/02/2009  . Essential hypertension, benign 10/22/2008  . Atrial fibrillation 10/22/2008  . DIAB W/UNSPEC COMP TYPE II/UNSPEC TYPE UNCNTRL 09/23/2008  . Hyperlipidemia 09/23/2008  . HYPOKALEMIA 09/23/2008  . OBESITY-MORBID BMI 54 09/23/2008  . CELLULITIS AND ABSCESS OF LEG EXCEPT FOOT 09/23/2008    Past Surgical History:  Procedure Laterality Date  . ABDOMINAL SURGERY  ~ 1954   BENIGN TUMOR REMOVED  . APPENDECTOMY    . CARDIOVERSION  2010   Archie Endo 09/19/2014  . CARDIOVERSION N/A 09/23/2014   Procedure: CARDIOVERSION;  Surgeon: Lelon Perla, MD;  Location: [facility] ENDOSCOPY;  Service: Cardiovascular;  Laterality: N/A;  . CARDIOVERSION N/A 12/11/2014   Procedure: CARDIOVERSION;  Surgeon: Sanda Klein, MD;  Location: [facility] ENDOSCOPY;  Service: Cardiovascular;  Laterality: N/A;  . CATARACT EXTRACTION W/PHACO Right 11/15/2012   Procedure: CATARACT EXTRACTION PHACO AND INTRAOCULAR LENS PLACEMENT ([place]);  Surgeon: Adonis Brook, MD;  Location: [place] [facility];  Service: Ophthalmology;  Laterality: Right;  . CATARACT EXTRACTION W/PHACO Left 11/29/2012   Procedure: CATARACT EXTRACTION PHACO AND INTRAOCULAR LENS PLACEMENT (IOC);  Surgeon: Adonis Brook, MD;  Location: [facility];  Service: Ophthalmology;  Laterality: Left;  . CORONARY ANGIOPLASTY WITH STENT PLACEMENT  August 2012   RCA DES - [facility]  . DEBRIDEMENT  FOOT Left    debriding diabetic foot ulcers  . EYE SURGERY    . FOREIGN BODY REMOVAL Right 2014   heel,  splinter removal   . LEFT AND RIGHT HEART CATHETERIZATION WITH CORONARY  ANGIOGRAM N/A 01/31/2014   Procedure: LEFT AND RIGHT HEART CATHETERIZATION WITH CORONARY ANGIOGRAM;  Surgeon: Blane Ohara, MD;  Location[facility] CATH LAB;  Service: Cardiovascular;  Laterality: N/A;  . PARS PLANA VITRECTOMY Left 06/05/2013   Procedure: PARS PLANA VITRECTOMY WITH 23 GAUGE with Endolaser(constellation);  Surgeon: Adonis Brook, MD;  Location: [place];  Service: Ophthalmology;  Laterality: Left;  . PILONIDAL CYST EXCISION N/A 01/08/2013   Procedure: CYST EXCISION PILONIDAL EXTENSIVE;  Surgeon: Ralene Ok, MD;  Location: [place];  Service: General;  Laterality: N/A;  . PILONIDAL CYST EXCISION  1980's   "in [place]"  . TEE WITHOUT CARDIOVERSION N/A 09/23/2014   Procedure: TRANSESOPHAGEAL ECHOCARDIOGRAM (TEE);  Surgeon: Lelon Perla, MD;  Location: [facility] ENDOSCOPY;  Service: Cardiovascular;  Laterality: N/A;  . TONSILLECTOMY         Home Medications    Prior to Admission medications   Medication  Sig Start Date End Date Taking? Authorizing Provider  ACCU-CHEK AVIVA PLUS test strip USE AS INSTRUCTED TO TEST BLOOD SUGAR 6 TIMES DAILY BEFORE MEALS. DX CODE- E11.65 10/26/16  Yes Elayne Snare, MD  aspirin EC 81 MG EC tablet Take 1 tablet (81 mg total) by mouth daily. 06/17/16  Yes [name], MD  atorvastatin (LIPITOR) 80 MG tablet Take 1 tablet (80 mg total) by mouth daily. 05/25/16  Yes Langeland, Dawn T, MD  BD INSULIN SYRINGE ULTRAFINE 31G X 15/64" 0.3 ML MISC USE TO IJNECT INSULIN 3 TIMES DAILY. 08/20/16  Yes Elayne Snare, MD  Blood Glucose Monitoring Suppl (ACCU-CHEK AVIVA PLUS) w/Device KIT USE TO CHECK BLOOD SUGAR 6 TIMES PER DAY DX CODE E11.65 08/05/16  Yes Elayne Snare, MD  gabapentin (NEURONTIN) 300 MG capsule Take 2 capsules (600 mg total) by mouth 2 (two) times daily. 05/25/16  Yes Langeland, Dawn T, MD  HUMALOG KWIKPEN 200 UNIT/ML SOPN INJECT 80 TO 100 UNITS 3 TIMES A DAY DEPENDING ON MEAL SIZE 07/09/16  Yes Elayne Snare, MD  HUMULIN R 500 UNIT/ML injection INJECT 0.56  MILLILITERS INTO V-GO PUMP DAILY, DX CODE E11.65 Patient taking differently: INJECT 100-120 UNITS DAILY PER SLIDING SCALE 12/27/16  Yes Elayne Snare, MD  hydrocerin (EUCERIN) CREA Apply 1 application topically 3 (three) times daily. legs 05/25/16  Yes Langeland, Dawn T, MD  Hypromellose (NATURAL BALANCE TEARS OP) Place 1 drop into both eyes daily as needed (for dry eyes).    Yes [provider]  insulin glargine (LANTUS) 100 UNIT/ML injection Inject 0.5 mLs (50 Units total) into the skin 2 (two) times daily. Patient taking differently: Inject 30-60 Units into the skin 2 (two) times daily. 60 units every morning and 30 units every evening 10/26/16  Yes Langeland, Dawn T, MD  Insulin Pen Needle 31G X 5 MM MISC Use to inject insulin 4 times daily 08/24/16  Yes Elayne Snare, MD  KLOR-CON M20 20 MEQ tablet TAKE 2 TABLETS BY MOUTH TWICE A DAY 01/14/17  Yes Lelon Perla, MD  Lancets (ACCU-CHEK MULTICLIX) lancets Use to test blood sugar 6 times daily- Dx code- E11.65 08/31/16  Yes Elayne Snare, MD  metolazone (ZAROXOLYN) 5 MG tablet Take 49m twice weekly on Mondays and Fridays. 09/06/16  Yes Bensimhon, DShaune Pascal MD  metoprolol (LOPRESSOR) 50 MG tablet TAKE 1 TABLET (50 MG TOTAL) BY MOUTH 2 (TWO) TIMES DAILY. 11/01/16  Yes Langeland, Dawn T, MD  Multiple Vitamin (MULTIVITAMIN WITH MINERALS) TABS Take 1 tablet by mouth daily.   Yes [provider]  oxyCODONE 20 MG TABS Take 1 tablet (20 mg total) by mouth every 6 (six) hours as needed for severe pain. Patient taking differently: Take 20-30 mg by mouth every 6 (six) hours as needed for severe pain.  12/26/15  Yes FRogue Bussing MD  spironolactone (ALDACTONE) 25 MG tablet Take 25 mg by mouth daily.   Yes [provider]  torsemide (DEMADEX) 20 MG tablet Take 4 tablets (80 mg total) by mouth 2 (two) times daily. 06/28/16  Yes Meng, Hao, PA  XARELTO 20 MG TABS tablet TAKE 1 TABLET BY MOUTH EVERY DAY WITH SUPPER 10/27/16  Yes  Langeland, Dawn T, MD  mupirocin cream (BACTROBAN) 2 % APPLY TOPICALLY 2 TIMES DAILY Patient not taking: Reported on 01/22/2017 09/06/16   AArnoldo Morale MD    Family History Family History  Problem Relation Age of Onset  . Adopted: Yes  . Other Other  PT ADOPTED    Social History Social History  Substance Use Topics  . Smoking status: Former Smoker    Packs/day: 1.00    Years: 20.00    Types: Cigarettes    Quit date: 07/13/1983  . Smokeless tobacco: Never Used  . Alcohol use No     Allergies   Patient has no known allergies.   Review of Systems Review of Systems  Constitutional: Negative for chills and fever.  HENT: Negative.   Respiratory: Positive for shortness of breath.   Cardiovascular: Positive for leg swelling. Negative for chest pain.  Gastrointestinal: Negative.  Negative for nausea and vomiting.  Musculoskeletal:       See HPI.  Skin: Negative.  Negative for wound.  Neurological: Positive for weakness.     Physical Exam Updated Vital Signs BP 115/61 (BP Location: Right Arm)   Pulse (!) 114   Temp 98.2 F (36.8 C) (Oral)   Resp 16   Ht 5' 11"  (1.803 m)   Wt (!) 158.8 kg (350 lb)   SpO2 99%   BMI 48.82 kg/m   Physical Exam  Constitutional: He is oriented to person, place, and time. He appears well-developed and well-nourished. No distress.  HENT:  Head: Normocephalic.  Neck: Normal range of motion. Neck supple.  Cardiovascular: Normal rate, regular rhythm and intact distal pulses.   Pulmonary/Chest: Effort normal. He has no wheezes. He has rales (Diffuse).  Abdominal: Soft. Bowel sounds are normal. There is no tenderness. There is no rebound and no guarding.  Musculoskeletal: He exhibits edema.  Bilateral LE edema. There is also bilateral LE redness and warmth. There are multiple scabbed areas without purulence or drainage. Knees bilaterally tender without bony deformity. Ambulation not attempted.    Neurological: He is alert and oriented  to person, place, and time.  Skin: Skin is warm and dry. No rash noted.  Psychiatric: He has a normal mood and affect.     ED Treatments / Results  Labs (all labs ordered are listed, but only abnormal results are displayed) Labs Reviewed  CBC WITH DIFFERENTIAL/PLATELET - Abnormal; Notable for the following:       Result Value   WBC 16.0 (*)    RBC 3.68 (*)    Hemoglobin 8.4 (*)    HCT 26.9 (*)    MCV 73.1 (*)    MCH 22.8 (*)    RDW 18.6 (*)    Neutro Abs 12.3 (*)    Monocytes Absolute 1.6 (*)    All other components within normal limits  COMPREHENSIVE METABOLIC PANEL - Abnormal; Notable for the following:    Sodium 133 (*)    Chloride 91 (*)    Glucose, Bld 398 (*)    BUN 21 (*)    Creatinine, Ser 1.34 (*)    Calcium 8.0 (*)    Albumin 2.7 (*)    ALT 13 (*)    Total Bilirubin 1.3 (*)    GFR calc non Af Amer 54 (*)    All other components within normal limits  URINALYSIS, ROUTINE W REFLEX MICROSCOPIC - Abnormal; Notable for the following:    Glucose, UA >=500 (*)    All other components within normal limits  CBG MONITORING, ED - Abnormal; Notable for the following:    Glucose-Capillary 379 (*)    All other components within normal limits  CBG MONITORING, ED - Abnormal; Notable for the following:    Glucose-Capillary 374 (*)    All other components within normal limits  BRAIN NATRIURETIC PEPTIDE    EKG  EKG Interpretation  Date/Time:  Saturday January 22 2017 03:21:00 EDT Ventricular Rate:  116 PR Interval:    QRS Duration: 107 QT Interval:  360 QTC Calculation: 501 R Axis:   -42 Text Interpretation:  Atrial fibrillation Ventricular premature complex Left anterior fascicular block Low voltage, precordial leads Consider anterior infarct Nonspecific T abnormalities, lateral leads Prolonged QT interval Since last tracing rate faster 16 Jun 2016 Confirmed by Rolland Porter [phone]) on 01/22/2017 3:29:09 AM       Radiology Dg Knee Complete 4 Views Left  Result Date:  01/22/2017 CLINICAL DATA:  Bilateral knee pain EXAM: LEFT KNEE - COMPLETE 4+ VIEW COMPARISON:  11/27/2014 FINDINGS: No acute fracture or malalignment. Mild to moderate patellofemoral degenerative changes with bony spurring. Trace suprapatellar effusion. Moderate degenerative changes of the lateral compartment and mild to moderate changes of the medial compartment. Chondrocalcinosis. IMPRESSION: 1. Mild to moderate arthritis of the left knee with trace suprapatellar effusion 2. Chondrocalcinosis Electronically Signed   By: Donavan Foil M.D.   On: 01/22/2017 03:59   Dg Knee Complete 4 Views Right  Result Date: 01/22/2017 CLINICAL DATA:  Bilateral knee pain EXAM: RIGHT KNEE - COMPLETE 4+ VIEW COMPARISON:  11/27/2014 FINDINGS: Chondrocalcinosis. No fracture or dislocation. Moderate to marked patellofemoral degenerative changes with sclerosis and osteophyte. Moderate degenerative changes of the medial compartment and mild to moderate degenerative changes of the lateral compartment. Vascular calcifications. IMPRESSION: 1. Arthritis of the right knee, most severe involving the patellofemoral compartment. No acute osseous abnormality. 2. Chondrocalcinosis Electronically Signed   By: Donavan Foil M.D.   On: 01/22/2017 04:01    Procedures Procedures (including critical care time)  Medications Ordered in ED Medications  vancomycin (VANCOCIN) IVPB 1000 mg/200 mL premix (not administered)  oxyCODONE (Oxy IR/ROXICODONE) immediate release tablet 20 mg (20 mg Oral Given 01/22/17 0318)  sodium chloride 0.9 % bolus 500 mL (500 mLs Intravenous New Bag/Given 01/22/17 0542)  morphine 4 MG/ML injection 4 mg (4 mg Intravenous Given 01/22/17 0542)     Initial Impression / Assessment and Plan / ED Course  I have reviewed the triage vital signs and the nursing notes.  Pertinent labs & imaging results that were available during my care of the patient were reviewed by me and considered in my medical decision making (see  chart for details).     Patient with complaint of bilateral knee pain, L>R since fall yesterday during an episode of hypoglycemia. He denies other injury. He c/o SOB without chest pain.   He reports he is unable to ambulate secondary to pain. He cannot manage ADL's, including food preparation, bathroom needs, hygiene. He will need to be admitted for likely early LE cellulitis (redness, warmth, pain, leukocytosis), possible increased edema of CHF, and placement for rehab until strong enough to return home safely.   Final Clinical Impressions(s) / ED Diagnoses   Final diagnoses:  None   1. LE cellulitis 2. Peripheral edema 3. weakness  New Prescriptions New Prescriptions   No medications on file     Charlann Lange, Hershal Coria 01/22/17 [id number]    Rolland Porter, MD 01/22/17 [phone]

## 2017-01-22 NOTE — Progress Notes (Signed)
Patient c/o SOB, ra sat 99 %, O2 applied for pt comfort. HR 92 , VS 112/66, 95. Will continue to monitor

## 2017-01-22 NOTE — H&P (Addendum)
History and Physical    LYNCOLN LEDGERWOOD IWP:[id number] DOB: [birth year] DOA: 01/22/2017  PCP: [name], MD  Patient coming from: Home  Chief Complaint: SOB and bilateral leg pain, swelling, redness  HPI: [name] is a 66 y.o. male with medical history significant of CAD, chronic atrial fibrillation, hyperlipidemia, hypertension, chronic respiratory failure and uses 2 L as needed at home, chronic diastolic heart failure, morbid obesity who presents with progressively worsening bilateral leg swelling, pain, increased redness, as well as shortness of breath. He states that his legs have gotten recurrent cellulitis when he gets fluid overloaded, and he picks at the blisters that form on his legs. He denies any recent fevers or chills, no chest pain. No nausea, vomiting or diarrhea or abdominal pain. He does state that he sleeps on a recliner at baseline, and sometimes uses his home oxygen as needed. He did not take his diuretic at home yesterday or today, but does state that he has been urinating adequately.  ED Course: Due to concern for bilateral lower leg cellulitis, IV vancomycin was started. CXR obtained which showed cardiomegaly and pulmonary vascular congestion   Review of Systems: As per HPI otherwise 10 point review of systems negative.   Past Medical History:  Diagnosis Date  . Arthritis    "hands and lower back" (09/19/2014)  . CAD (coronary artery disease)    a. s/p PCI to RCA in 2012. b. prior cath in 01/2014 with elevated L/RH pressures, mild-mod CAD of LAD/LCx with patent RCA, normal EF, c/b CIN/CHF.  [name] Carpal tunnel syndrome, bilateral   . Cellulitis   . Chronic diastolic CHF (congestive heart failure) ([place])   . Chronic kidney disease (CKD), stage III (moderate)   . Chronic lower back pain   . History of blood transfusion ~ 1954   "related to OR"  . Hyperlipidemia   . Hypertension   . Hypoxia    a. Qualified for home O2 at [place] in 09/2014.  [name] Lower GI bleed   .  Microcytic anemia   . Morbid obesity ([place])   . Neuropathy   . OSA (obstructive sleep apnea)    "I wear nasal prongs; haven't been using prongs recently" (09/19/2014)  . PAF (paroxysmal atrial fibrillation) ([place])    TEE DCCV 09/23/2014  . Physical deconditioning   . Pilonidal cyst 1980's; 01/25/2013  . Scrotal abscess   . Type II diabetes mellitus ([place])     Past Surgical History:  Procedure Laterality Date  . ABDOMINAL SURGERY  ~ 1954   BENIGN TUMOR REMOVED  . APPENDECTOMY    . CARDIOVERSION  2010   Archie Endo 09/19/2014  . CARDIOVERSION N/A 09/23/2014   Procedure: CARDIOVERSION;  Surgeon: Lelon Perla, MD;  Location: [facility] ENDOSCOPY;  Service: Cardiovascular;  Laterality: N/A;  . CARDIOVERSION N/A 12/11/2014   Procedure: CARDIOVERSION;  Surgeon: Sanda Klein, MD;  Location: [place] ENDOSCOPY;  Service: Cardiovascular;  Laterality: N/A;  . CATARACT EXTRACTION W/PHACO Right 11/15/2012   Procedure: CATARACT EXTRACTION PHACO AND INTRAOCULAR LENS PLACEMENT ([place]);  Surgeon: Adonis Brook, MD;  Location: [place];  Service: Ophthalmology;  Laterality: Right;  . CATARACT EXTRACTION W/PHACO Left 11/29/2012   Procedure: CATARACT EXTRACTION PHACO AND INTRAOCULAR LENS PLACEMENT (IOC);  Surgeon: Adonis Brook, MD;  Location: [place];  Service: Ophthalmology;  Laterality: Left;  . CORONARY ANGIOPLASTY WITH STENT PLACEMENT  August 2012   RCA DES - [facility]  . DEBRIDEMENT  FOOT Left    debriding diabetic  foot ulcers  . EYE SURGERY    . FOREIGN BODY REMOVAL Right 2014   heel,  splinter removal   . LEFT AND RIGHT HEART CATHETERIZATION WITH CORONARY ANGIOGRAM N/A 01/31/2014   Procedure: LEFT AND RIGHT HEART CATHETERIZATION WITH CORONARY ANGIOGRAM;  Surgeon: Blane Ohara, MD;  Location: [facility] C[facility] LAB;  Service: Cardiovascular;  Laterality: N/A;  . PARS PLANA VITRECTOMY Left 06/05/2013   Procedure: PARS PLANA VITRECTOMY WITH 23 GAUGE with Endolaser(constellation);  Surgeon: Adonis Brook,  MD;  Location: [place];  Service: Ophthalmology;  Laterality: Left;  . PILONIDAL CYST EXCISION N/A 01/08/2013   Procedure: CYST EXCISION PILONIDAL EXTENSIVE;  Surgeon: Ralene Ok, MD;  Location: [place];  Service: General;  Laterality: N/A;  . PILONIDAL CYST EXCISION  1980's   "in [place]"  . TEE WITHOUT CARDIOVERSION N/A 09/23/2014   Procedure: TRANSESOPHAGEAL ECHOCARDIOGRAM (TEE);  Surgeon: Lelon Perla, MD;  Location: [facility] ENDOSCOPY;  Service: Cardiovascular;  Laterality: N/A;  . TONSILLECTOMY       reports that he quit smoking about 33 years ago. His smoking use included Cigarettes. He has a 20.00 pack-year smoking history. He has never used smokeless tobacco. He reports that he does not drink alcohol or use drugs.  No Known Allergies  Family History  Problem Relation Age of Onset  . Adopted: Yes  . Other Other        PT ADOPTED    Prior to Admission medications   Medication Sig Start Date End Date Taking? Authorizing Provider  ACCU-CHEK AVIVA PLUS test strip USE AS INSTRUCTED TO TEST BLOOD SUGAR 6 TIMES DAILY BEFORE MEALS. DX CODE- E11.65 10/26/16  Yes Elayne Snare, MD  aspirin EC 81 MG EC tablet Take 1 tablet (81 mg total) by mouth daily. 06/17/16  Yes [name], MD  atorvastatin (LIPITOR) 80 MG tablet Take 1 tablet (80 mg total) by mouth daily. 05/25/16  Yes Langeland, Dawn T, MD  BD INSULIN SYRINGE ULTRAFINE 31G X 15/64" 0.3 ML MISC USE TO IJNECT INSULIN 3 TIMES DAILY. 08/20/16  Yes Elayne Snare, MD  Blood Glucose Monitoring Suppl (ACCU-CHEK AVIVA PLUS) w/Device KIT USE TO CHECK BLOOD SUGAR 6 TIMES PER DAY DX CODE E11.65 08/05/16  Yes Elayne Snare, MD  gabapentin (NEURONTIN) 300 MG capsule Take 2 capsules (600 mg total) by mouth 2 (two) times daily. 05/25/16  Yes Langeland, Dawn T, MD  HUMALOG KWIKPEN 200 UNIT/ML SOPN INJECT 80 TO 100 UNITS 3 TIMES A DAY DEPENDING ON MEAL SIZE 07/09/16  Yes Elayne Snare, MD  HUMULIN R 500 UNIT/ML injection INJECT 0.56 MILLILITERS INTO V-GO PUMP  DAILY, DX CODE E11.65 Patient taking differently: INJECT 100-120 UNITS DAILY PER SLIDING SCALE 12/27/16  Yes Elayne Snare, MD  hydrocerin (EUCERIN) CREA Apply 1 application topically 3 (three) times daily. legs 05/25/16  Yes Langeland, Dawn T, MD  Hypromellose (NATURAL BALANCE TEARS OP) Place 1 drop into both eyes daily as needed (for dry eyes).    Yes [provider]  insulin glargine (LANTUS) 100 UNIT/ML injection Inject 0.5 mLs (50 Units total) into the skin 2 (two) times daily. Patient taking differently: Inject 30-60 Units into the skin 2 (two) times daily. 60 units every morning and 30 units every evening 10/26/16  Yes Langeland, Dawn T, MD  Insulin Pen Needle 31G X 5 MM MISC Use to inject insulin 4 times daily 08/24/16  Yes Elayne Snare, MD  KLOR-CON M20 20 MEQ tablet TAKE 2 TABLETS BY MOUTH TWICE A DAY 01/14/17  Yes Crenshaw,  Denice Bors, MD  Lancets (ACCU-CHEK MULTICLIX) lancets Use to test blood sugar 6 times daily- Dx code- E11.65 08/31/16  Yes Elayne Snare, MD  metolazone (ZAROXOLYN) 5 MG tablet Take 70m twice weekly on Mondays and Fridays. 09/06/16  Yes Bensimhon, DShaune Pascal MD  metoprolol (LOPRESSOR) 50 MG tablet TAKE 1 TABLET (50 MG TOTAL) BY MOUTH 2 (TWO) TIMES DAILY. 11/01/16  Yes Langeland, Dawn T, MD  Multiple Vitamin (MULTIVITAMIN WITH MINERALS) TABS Take 1 tablet by mouth daily.   Yes [provider]  oxyCODONE 20 MG TABS Take 1 tablet (20 mg total) by mouth every 6 (six) hours as needed for severe pain. Patient taking differently: Take 20-30 mg by mouth every 6 (six) hours as needed for severe pain.  12/26/15  Yes FRogue Bussing MD  spironolactone (ALDACTONE) 25 MG tablet Take 25 mg by mouth daily.   Yes [provider]  torsemide (DEMADEX) 20 MG tablet Take 4 tablets (80 mg total) by mouth 2 (two) times daily. 06/28/16  Yes Meng, Hao, PA  XARELTO 20 MG TABS tablet TAKE 1 TABLET BY MOUTH EVERY DAY WITH SUPPER 10/27/16  Yes Langeland, DLeda Quail MD  mupirocin  cream (BACTROBAN) 2 % APPLY TOPICALLY 2 TIMES DAILY Patient not taking: Reported on 01/22/2017 09/06/16   AArnoldo Morale MD    Physical Exam: Vitals:   01/22/17 0700 01/22/17 0815 01/22/17 0826 01/22/17 0846  BP: (!) 125/56  (!) 126/103 123/71  Pulse: 95 100  (!) 118  Resp: 11 15  18   Temp:    98.1 F (36.7 C)  TempSrc:    Oral  SpO2: 97% 99%  99%  Weight:    (!) 164.4 kg (362 lb 7 oz)  Height:    6' (1.829 m)     Constitutional: NAD, calm, comfortable Eyes: PERRL, lids and conjunctivae normal ENMT: Mucous membranes are moist. Posterior pharynx clear of any exudate or lesions.Normal dentition.  Neck: normal, supple, no masses, no thyromegaly Respiratory: clear to auscultation bilaterally, no wheezing, no crackles. Normal respiratory effort. No accessory muscle use.  Cardiovascular: Tachycardic rate, irregular rhythm, S1/S2, no murmurs / rubs / gallops. +bilateral 2-3 edema  Abdomen: no tenderness, no masses palpated. No hepatosplenomegaly. Bowel sounds positive.  Musculoskeletal: no clubbing / cyanosis. No joint deformity upper and lower extremities. Good ROM, no contractures. Normal muscle tone.  Skin: +bilateral LE with scabbing, chronic venous stasis changes, erythema without purulence  Neurologic: CN 2-12 grossly intact. Strength 5/5 in all 4.  Psychiatric: Normal judgment and insight. Alert and oriented x 3. Normal mood.   Labs on Admission: I have personally reviewed following labs and imaging studies  CBC:  Recent Labs Lab 01/22/17 0316  WBC 16.0*  NEUTROABS 12.3*  HGB 8.4*  HCT 26.9*  MCV 73.1*  PLT 3035  Basic Metabolic Panel:  Recent Labs Lab 01/22/17 0316  NA 133*  K 3.8  CL 91*  CO2 32  GLUCOSE 398*  BUN 21*  CREATININE 1.34*  CALCIUM 8.0*   GFR: Estimated Creatinine Clearance: 87.3 mL/min (A) (by C-G formula based on SCr of 1.34 mg/dL (H)). Liver Function Tests:  Recent Labs Lab 01/22/17 0316  AST 23  ALT 13*  ALKPHOS 66  BILITOT 1.3*    PROT 6.6  ALBUMIN 2.7*   No results for input(s): LIPASE, AMYLASE in the last 168 hours. No results for input(s): AMMONIA in the last 168 hours. Coagulation Profile: No results for input(s): INR, PROTIME in the last 168 hours. Cardiac  Enzymes: No results for input(s): CKTOTAL, CKMB, CKMBINDEX, TROPONINI in the last 168 hours. BNP (last 3 results) No results for input(s): PROBNP in the last 8760 hours. HbA1C: No results for input(s): HGBA1C in the last 72 hours. CBG:  Recent Labs Lab 01/22/17 0159 01/22/17 0417  GLUCAP 379* 374*   Lipid Profile: No results for input(s): CHOL, HDL, LDLCALC, TRIG, CHOLHDL, LDLDIRECT in the last 72 hours. Thyroid Function Tests: No results for input(s): TSH, T4TOTAL, FREET4, T3FREE, THYROIDAB in the last 72 hours. Anemia Panel: No results for input(s): VITAMINB12, FOLATE, FERRITIN, TIBC, IRON, RETICCTPCT in the last 72 hours. Urine analysis:    Component Value Date/Time   COLORURINE YELLOW 01/22/2017 0646   APPEARANCEUR CLEAR 01/22/2017 0646   LABSPEC 1.011 01/22/2017 0646   PHURINE 5.0 01/22/2017 0646   GLUCOSEU >=500 (A) 01/22/2017 0646   HGBUR NEGATIVE 01/22/2017 0646   BILIRUBINUR NEGATIVE 01/22/2017 0646   KETONESUR NEGATIVE 01/22/2017 0646   PROTEINUR NEGATIVE 01/22/2017 0646   UROBILINOGEN 1.0 11/27/2014 0755   NITRITE NEGATIVE 01/22/2017 0646   LEUKOCYTESUR NEGATIVE 01/22/2017 0646   Sepsis Labs: !!!!!!!!!!!!!!!!!!!!!!!!!!!!!!!!!!!!!!!!!!!! @LABRCNTIP (procalcitonin:4,lacticidven:4) )No results found for this or any previous visit (from the past 240 hour(s)).   Radiological Exams on Admission: Dg Chest 2 View  Result Date: 01/22/2017 CLINICAL DATA:  Shortness of breath. Possible lower extremity cellulitis. EXAM: CHEST  2 VIEW COMPARISON:  06/09/2016. FINDINGS: Mildly progressive enlargement of the cardiac silhouette and prominence of the pulmonary vasculature. No pulmonary edema or pleural fluid seen. Thoracic spine degenerative  changes. Aortic arch calcifications. IMPRESSION: Mildly progressive cardiomegaly with interval mild pulmonary vascular congestion. Electronically Signed   By: Claudie Revering M.D.   On: 01/22/2017 08:05   Dg Knee Complete 4 Views Left  Result Date: 01/22/2017 CLINICAL DATA:  Bilateral knee pain EXAM: LEFT KNEE - COMPLETE 4+ VIEW COMPARISON:  11/27/2014 FINDINGS: No acute fracture or malalignment. Mild to moderate patellofemoral degenerative changes with bony spurring. Trace suprapatellar effusion. Moderate degenerative changes of the lateral compartment and mild to moderate changes of the medial compartment. Chondrocalcinosis. IMPRESSION: 1. Mild to moderate arthritis of the left knee with trace suprapatellar effusion 2. Chondrocalcinosis Electronically Signed   By: Donavan Foil M.D.   On: 01/22/2017 03:59   Dg Knee Complete 4 Views Right  Result Date: 01/22/2017 CLINICAL DATA:  Bilateral knee pain EXAM: RIGHT KNEE - COMPLETE 4+ VIEW COMPARISON:  11/27/2014 FINDINGS: Chondrocalcinosis. No fracture or dislocation. Moderate to marked patellofemoral degenerative changes with sclerosis and osteophyte. Moderate degenerative changes of the medial compartment and mild to moderate degenerative changes of the lateral compartment. Vascular calcifications. IMPRESSION: 1. Arthritis of the right knee, most severe involving the patellofemoral compartment. No acute osseous abnormality. 2. Chondrocalcinosis Electronically Signed   By: Donavan Foil M.D.   On: 01/22/2017 04:01    EKG: Independently reviewed. A Fib with PVC   Assessment/Plan Principal Problem:   Acute on chronic diastolic CHF (congestive heart failure) (HCC) Active Problems:   Bilateral lower leg cellulitis  Acute diastolic CHF -CXR: Mildly progressive cardiomegaly with interval mild pulmonary vascular congestion  -EF 60-65% on echo 2017, check echo this admission -BNP 183.2  -Continue Aldactone -Start lasix IV 80m BID. Takes torsemide 879mBID  and metolazone twice weekly at baseline.  -Strict I/Os, daily weight    Sepsis secondary to bilateral LE cellulitis -Tachycardic, with leukocytosis on admission -Empiric rocephin  CKD stage 3 -Baseline Cr 1.3. Stable   Chronic anemia -Baseline Hgb 9-10 -Trend CBC, no current concern  or report of bleed   Chronic atrial fibrillation -CHA2SVASc 4 -Continue Xarelto  CAD -Continue Aspirin  HLD -Continue Lipitor   DM type 2, uncontrolled with hyperglycemia -Lantus 60u AM and 30u PM, Novolog 6u TID, SSI -Continue Neurontin  HTN -Continue Lopressor   Chronic respiratory failure -Uses 2L [place] O2 at home on prn basis  OSA -Intolerant to CPAP, sleeps in a recliner  Morbid obesity -Body mass index is 49.16 kg/m.    DVT prophylaxis: Xarelto Code Status: Full  Family Communication: No family at bedside  Disposition Plan: Pending improvement Consults called: None   Severity of Illness: The appropriate patient status for this patient is INPATIENT. Inpatient status is judged to be reasonable and necessary in order to provide the required intensity of service to ensure the patient's safety. The patient's presenting symptoms, physical exam findings, and initial radiographic and laboratory data in the context of their chronic comorbidities is felt to place them at high risk for further clinical deterioration. Furthermore, it is not anticipated that the patient will be medically stable for discharge from the hospital within 2 midnights of admission. The following factors support the patient status of inpatient.   " The patient's presenting symptoms include dyspnea and leg swelling. " The worrisome physical exam findings include bilateral leg swelling. " The initial radiographic and laboratory data are worrisome because of CHF exacerbation. " The chronic co-morbidities include A fib, CHF, DM, HTN, HLD.  * I certify that at the point of admission it is my clinical judgment that the  patient will require inpatient hospital care spanning beyond 2 midnights from the point of admission due to high intensity of service, high risk for further deterioration and high frequency of surveillance required.*   Dessa Phi, DO [name] [email or web address] Password [facility] 01/22/2017, 9:23 AM

## 2017-01-22 NOTE — ED Triage Notes (Signed)
Pt is presented by EMS who report he called c/o bilateral knee pain 9/10 that he attributes to a fall he states he had yesterday after a hypoglycemic episode. EMS report that his spot check cbg was [phone] with pt stating he has not been able to take his insulin as he was unable to move around at his residence.

## 2017-01-22 NOTE — ED Provider Notes (Signed)
Patient states he had some underlying problem with his left knee and is waiting to see Dr. Lajoyce Corners, orthopedist, to get a MRI. However he fell yesterday and since he fell he is unable to get up and walk. He states he has a history of congestive heart failure and he has been retaining fluid. He feels like his legs are swollen. He states that the redness is new in his legs and he has been having areas where he the skin is open and he has been picking at it.  Patient is obese in no acute distress. On exam of his lower extremities he has thickening of his skin bilaterally with some increased redness between the knees and ankles. He has multiple excoriated areas on his legs.  RLE   LLE    Medical screening examination/treatment/procedure(s) were conducted as a shared visit with non-physician practitioner(s) and myself.  I personally evaluated the patient during the encounter.   EKG Interpretation  Date/Time:  Saturday January 22 2017 03:21:00 EDT Ventricular Rate:  116 PR Interval:    QRS Duration: 107 QT Interval:  360 QTC Calculation: 501 R Axis:   -42 Text Interpretation:  Atrial fibrillation Ventricular premature complex Left anterior fascicular block Low voltage, precordial leads Consider anterior infarct Nonspecific T abnormalities, lateral leads Prolonged QT interval Since last tracing rate faster 16 Jun 2016 Confirmed by Devoria Albe ([id number]) on 01/22/2017 3:29:09 AM       Devoria Albe, MD, Iline Oven, Jodelle Gross, MD 01/22/17 [phone]

## 2017-01-22 NOTE — Progress Notes (Signed)
CRITICAL VALUE ALERT  Critical Value:  CBG 524  Date & Time Notied:  01/22/17  Provider Notified: DR. Alvino Chapel  Orders Received/Actions taken: STAT BMP, if glucose level remains administer 20 units Novolog.

## 2017-01-23 DIAGNOSIS — L03116 Cellulitis of left lower limb: Secondary | ICD-10-CM

## 2017-01-23 DIAGNOSIS — E1322 Other specified diabetes mellitus with diabetic chronic kidney disease: Secondary | ICD-10-CM

## 2017-01-23 DIAGNOSIS — M170 Bilateral primary osteoarthritis of knee: Secondary | ICD-10-CM

## 2017-01-23 DIAGNOSIS — E1121 Type 2 diabetes mellitus with diabetic nephropathy: Secondary | ICD-10-CM

## 2017-01-23 DIAGNOSIS — I482 Chronic atrial fibrillation: Secondary | ICD-10-CM

## 2017-01-23 DIAGNOSIS — N183 Chronic kidney disease, stage 3 (moderate): Secondary | ICD-10-CM

## 2017-01-23 DIAGNOSIS — I5033 Acute on chronic diastolic (congestive) heart failure: Secondary | ICD-10-CM

## 2017-01-23 DIAGNOSIS — L03115 Cellulitis of right lower limb: Secondary | ICD-10-CM

## 2017-01-23 LAB — BASIC METABOLIC PANEL
Anion gap: 11 (ref 5–15)
BUN: 19 mg/dL (ref 6–20)
CHLORIDE: 93 mmol/L — AB (ref 101–111)
CO2: 33 mmol/L — ABNORMAL HIGH (ref 22–32)
Calcium: 8.3 mg/dL — ABNORMAL LOW (ref 8.9–10.3)
Creatinine, Ser: 1.3 mg/dL — ABNORMAL HIGH (ref 0.61–1.24)
GFR calc Af Amer: >60 mL/min (ref >60)
GFR, EST NON AFRICAN AMERICAN: 56 mL/min — AB (ref >60)
GLUCOSE: 228 mg/dL — AB (ref 65–99)
POTASSIUM: 3.3 mmol/L — AB (ref 3.5–5.1)
Sodium: 137 mmol/L (ref 135–145)

## 2017-01-23 LAB — CBC
HCT: 28.7 % — ABNORMAL LOW (ref 39.0–52.0)
Hemoglobin: 8.9 g/dL — ABNORMAL LOW (ref 13.0–17.0)
MCH: 23.1 pg — AB (ref 26.0–34.0)
MCHC: 31 g/dL (ref 30.0–36.0)
MCV: 74.4 fL — AB (ref 78.0–100.0)
PLATELETS: 356 10*3/uL (ref 150–400)
RBC: 3.86 MIL/uL — AB (ref 4.22–5.81)
RDW: 18.7 % — ABNORMAL HIGH (ref 11.5–15.5)
WBC: 17.1 10*3/uL — ABNORMAL HIGH (ref 4.0–10.5)

## 2017-01-23 LAB — GLUCOSE, CAPILLARY
Glucose-Capillary: 233 mg/dL — ABNORMAL HIGH (ref 65–99)
Glucose-Capillary: 242 mg/dL — ABNORMAL HIGH (ref 65–99)
Glucose-Capillary: 270 mg/dL — ABNORMAL HIGH (ref 65–99)
Glucose-Capillary: 309 mg/dL — ABNORMAL HIGH (ref 65–99)

## 2017-01-23 MED ORDER — OXYCODONE HCL 5 MG PO TABS
30.0000 mg | ORAL_TABLET | ORAL | Status: DC | PRN
  Administered 2017-01-23 – 2017-01-25 (×11): 30 mg via ORAL
  Filled 2017-01-23 (×11): qty 6

## 2017-01-23 MED ORDER — PREDNISONE 5 MG PO TABS
30.0000 mg | ORAL_TABLET | Freq: Every day | ORAL | Status: DC
  Administered 2017-01-24 – 2017-01-25 (×2): 30 mg via ORAL
  Filled 2017-01-23 (×2): qty 1

## 2017-01-23 MED ORDER — INSULIN GLARGINE 100 UNIT/ML ~~LOC~~ SOLN
80.0000 [IU] | Freq: Every day | SUBCUTANEOUS | Status: DC
  Administered 2017-01-24: 80 [IU] via SUBCUTANEOUS
  Filled 2017-01-23 (×2): qty 0.8

## 2017-01-23 MED ORDER — INSULIN GLARGINE 100 UNIT/ML ~~LOC~~ SOLN
80.0000 [IU] | Freq: Every day | SUBCUTANEOUS | Status: DC
  Administered 2017-01-23 – 2017-01-24 (×2): 80 [IU] via SUBCUTANEOUS
  Filled 2017-01-23 (×2): qty 0.8

## 2017-01-23 MED ORDER — POTASSIUM CHLORIDE CRYS ER 20 MEQ PO TBCR
40.0000 meq | EXTENDED_RELEASE_TABLET | Freq: Once | ORAL | Status: AC
Start: 2017-01-23 — End: 2017-01-23
  Administered 2017-01-23: 40 meq via ORAL
  Filled 2017-01-23: qty 2

## 2017-01-23 MED ORDER — OXYCODONE HCL 5 MG PO TABS: 30.0000 mg | ORAL_TABLET | Freq: Four times a day (QID) | ORAL | Status: DC | PRN

## 2017-01-23 NOTE — Progress Notes (Addendum)
Inpatient Diabetes Program Recommendations  AACE/ADA: New Consensus Statement on Inpatient Glycemic Control (2015)  Target Ranges:  Prepandial:   less than 140 mg/dL      Peak postprandial:   less than 180 mg/dL (1-2 hours)      Critically ill patients:  140 - 180 mg/dL   Review of Glycemic Control  Diabetes history: DM 2 (Sees Dr. Lucianne Muss, Endocrinology) Outpatient Diabetes medications: Lantus 60 units QAM, 30 units QPM, Humulin R U-500 20 units breakfast-20 units lunch-30 units supper Current orders for Inpatient glycemic control: Lantus 60 units QAM, 30 units QPM, Novolog Resistant + Novolog HS scale + Novolog 6 units tid meal coverage  Inpatient Diabetes Program Recommendations:   Based on Dr. Remus Blake note consider changing patient to    -   Leave patient on - Lantus 60 units QAM, 30 units QPM   -   Reorder Patient's U-500 insulin from home starting at 15 units tid with meals.   -   Leave patient on Novolog Correction but reduce to moderate 0-15 units tid    -   Consider d/cing Novolog 6 units meal coverage and Novolog HS scale  Thanks,  Christena Deem RN, MSN, [facility] Inpatient Diabetes Coordinator Team Pager [phone] (8a-5p)

## 2017-01-23 NOTE — Evaluation (Signed)
Occupational Therapy Evaluation Patient Details Name: [name] MRN: [id number] DOB: 07-20-1950 Today's Date: 01/23/2017    History of Present Illness [name] is a 66 y.o. male with medical history significant of CAD, chronic atrial fibrillation, hyperlipidemia, hypertension, chronic respiratory failure and uses 2 L as needed at home, chronic diastolic heart failure, morbid obesity who presents with progressively worsening bilateral leg swelling, pain, increased redness, as well as shortness of breath. He states that his legs have gotten recurrent cellulitis when he gets fluid overloaded, and he picks at the blisters that form on his legs Pt came to ER yesterday after a fall in which he had knee pain   Clinical Impression   Pt admitted with knee pain. Pt currently with functional limitations due to the deficits listed below (see OT Problem List).  Pt will benefit from skilled OT to increase their safety and independence with ADL and functional mobility for ADL to facilitate discharge to venue listed below.      Follow Up Recommendations  SNF    Equipment Recommendations  None recommended by OT       Precautions / Restrictions Precautions Precautions: Fall      Mobility Bed Mobility Overal bed mobility: Needs Assistance Bed Mobility: Sit to Supine     Supine to sit: +2 for safety/equipment;+2 for physical assistance;Mod assist Sit to supine: Max assist;+2 for physical assistance;+2 for safety/equipment   General bed mobility comments: needed A with R LLE  Transfers                 General transfer comment: did not perform        ADL either performed or assessed with clinical judgement   ADL Overall ADL's : Needs assistance/impaired Eating/Feeding: Set up;Bed level                                     General ADL Comments: Pt sat EOB with OT and PT briefly before needing to return to supine due to pain     Vision Patient Visual  Report: No change from baseline              Pertinent Vitals/Pain Pain Assessment: 0-10 Pain Score: 10-Worst pain ever Pain Location: r knee Pain Descriptors / Indicators: Discomfort;Sore Pain Intervention(s): Limited activity within patient's tolerance;Monitored during session;Patient requesting pain meds-RN notified;Repositioned     Hand Dominance     Extremity/Trunk Assessment Upper Extremity Assessment Upper Extremity Assessment: Overall WFL for tasks assessed              Cognition Arousal/Alertness: Awake/alert Behavior During Therapy: Agitated Overall Cognitive Status: Within Functional Limits for tasks assessed                                                Home Living Family/patient expects to be discharged to:: Skilled nursing facility Living Arrangements: (P) Other relatives (sister)                                               OT Problem List: Decreased strength;Decreased activity tolerance;Decreased knowledge of use of DME or AE;Decreased safety awareness;Impaired balance (sitting and/or standing);Decreased  knowledge of precautions      OT Treatment/Interventions: Self-care/ADL training;Patient/family education;DME and/or AE instruction    OT Goals(Current goals can be found in the care plan section) Acute Rehab OT Goals Patient Stated Goal: be able top take care of myself OT Goal Formulation: With patient Time For Goal Achievement: 02/06/17 Potential to Achieve Goals: Good  OT Frequency: Min 2X/week   Barriers to D/C: Decreased caregiver support          Co-evaluation              AM-PAC PT "6 Clicks" Daily Activity     Outcome Measure Help from another person eating meals?: None Help from another person taking care of personal grooming?: A Little Help from another person toileting, which includes using toliet, bedpan, or urinal?: Total Help from another person bathing (including washing, rinsing,  drying)?: A Lot Help from another person to put on and taking off regular upper body clothing?: Total Help from another person to put on and taking off regular lower body clothing?: Total 6 Click Score: 12   End of Session Nurse Communication: Mobility status  Activity Tolerance: Patient limited by pain Patient left: in bed;with call bell/phone within reach  OT Visit Diagnosis: Pain;Unsteadiness on feet (R26.81);History of falling (Z91.81)                Time: 0174-9449 OT Time Calculation (min): 21 min Charges:  OT General Charges $OT Visit: 1 Procedure OT Evaluation $OT Eval Moderate Complexity: 1 Procedure G-Codes:     Lise Auer, OT [phone]  Einar Crow D 01/23/2017, 12:31 PM

## 2017-01-23 NOTE — Evaluation (Signed)
Physical Therapy Evaluation Patient Details Name: [name] MRN: [id number] DOB: 1950/11/20 Today's Date: 01/23/2017   History of Present Illness  [name] is a 66 y.o. male with medical history significant of CAD, chronic atrial fibrillation, hyperlipidemia, hypertension, chronic respiratory failure and uses 2 L as needed at home, chronic diastolic heart failure, morbid obesity who presents with progressively worsening bilateral leg swelling, pain, increased redness, as well as shortness of breath. He states that his legs have gotten recurrent cellulitis when he gets fluid overloaded, and he picks at the blisters that form on his legs Pt came to ER yesterday after a fall in which he had knee pain; knee xrays negative for injury  Clinical Impression  Pt admitted with above diagnosis. Pt currently with functional limitations due to the deficits listed below (see PT Problem List). Pt will benefit from skilled PT to increase their independence and safety with mobility to allow discharge to the venue listed below.   Will need SNF, limited cooperation/mobility d/t pain; will follow in acute setting     Follow Up Recommendations SNF;Supervision/Assistance - 24 hour    Equipment Recommendations  None recommended by PT    Recommendations for Other Services       Precautions / Restrictions Precautions Precautions: Fall      Mobility  Bed Mobility Overal bed mobility: Needs Assistance Bed Mobility: Sit to Supine     Supine to sit: +2 for safety/equipment;+2 for physical assistance;Mod assist;Min assist;HOB elevated Sit to supine: Max assist;+2 for physical assistance;+2 for safety/equipment;Mod assist;HOB elevated   General bed mobility comments: needed A with R LLE, guarding trunk, HOB elevated --pt sleeps in recliner; incr time, heavy use of rail  Transfers                 General transfer comment: did not perform/unable d/t pt c/o pain in knee  Ambulation/Gait                 Stairs            Wheelchair Mobility    Modified Rankin (Stroke Patients Only)       Balance Overall balance assessment: Needs assistance;History of Falls Sitting-balance support: No upper extremity supported;Feet supported Sitting balance-Leahy Scale: Fair         Standing balance comment: unable                             Pertinent Vitals/Pain Pain Assessment: 0-10 Pain Score: 10-Worst pain ever Pain Location: r knee Pain Descriptors / Indicators: Sore;Grimacing;Other (Comment) (screaming) Pain Intervention(s): Limited activity within patient's tolerance;Monitored during session;Premedicated before session;Patient requesting pain meds-RN notified    Home Living Family/patient expects to be discharged to:: Skilled nursing facility Living Arrangements: Other relatives (sister) Available Help at Discharge: Other (Comment) (sister works)             Additional Comments: pt lives with his sister, they keep an eye on each other. Drives, does his own shopping and self care, sleeps in a recliner, does do cooking and washing dishes with rest breaks due to back pain with standing; partial infor from pt and partial from pervious notes; pt becomes very agitated when asked questions regarding his PLOF    Prior Function Level of Independence: Independent         Comments: drives, sleeps in recliner, does not use AD for amb     Hand Dominance  Extremity/Trunk Assessment   Upper Extremity Assessment Upper Extremity Assessment: Overall WFL for tasks assessed    Lower Extremity Assessment Lower Extremity Assessment: RLE deficits/detail;LLE deficits/detail RLE: Unable to fully assess due to pain LLE: Unable to fully assess due to pain       Communication   Communication: No difficulties  Cognition Arousal/Alertness: Awake/alert Behavior During Therapy: Agitated Overall Cognitive Status: Within Functional Limits for  tasks assessed                                 General Comments: easily agitated by questions and therapists' requests for pt to move      General Comments      Exercises     Assessment/Plan    PT Assessment Patient needs continued PT services  PT Problem List Pain;Decreased activity tolerance;Decreased knowledge of use of DME;Decreased strength       PT Treatment Interventions DME instruction;Gait training;Functional mobility training;Therapeutic activities;Therapeutic exercise;Patient/family education    PT Goals (Current goals can be found in the Care Plan section)  Acute Rehab PT Goals Patient Stated Goal: be able to take care of myself again PT Goal Formulation: With patient Time For Goal Achievement: 02/06/17 Potential to Achieve Goals: Fair    Frequency Min 2X/week   Barriers to discharge Decreased caregiver support      Co-evaluation PT/OT/SLP Co-Evaluation/Treatment: Yes Reason for Co-Treatment: For patient/therapist safety;Necessary to address cognition/behavior during functional activity PT goals addressed during session: Mobility/safety with mobility OT goals addressed during session: ADL's and self-care       AM-PAC PT "6 Clicks" Daily Activity  Outcome Measure Difficulty turning over in bed (including adjusting bedclothes, sheets and blankets)?: Total Difficulty moving from lying on back to sitting on the side of the bed? : Total Difficulty sitting down on and standing up from a chair with arms (e.g., wheelchair, bedside commode, etc,.)?: Total Help needed moving to and from a bed to chair (including a wheelchair)?: Total Help needed walking in hospital room?: Total Help needed climbing 3-5 steps with a railing? : Total 6 Click Score: 6    End of Session Equipment Utilized During Treatment: Gait belt Activity Tolerance: Patient limited by pain Patient left: in bed;with call bell/phone within reach;with bed alarm set Nurse  Communication: Mobility status PT Visit Diagnosis: Pain;History of falling (Z91.81) Pain - Right/Left: Right Pain - part of body: Knee    Time: 9381-8299 PT Time Calculation (min) (ACUTE ONLY): 12 min   Charges:   PT Evaluation $PT Eval Moderate Complexity: 1 Procedure     PT G CodesDrucilla Chalet, PT Pager: [phone] 01/23/2017   [facility] 01/23/2017, 1:15 PM

## 2017-01-23 NOTE — Progress Notes (Signed)
[name] PROGRESS NOTE  LEARY MCNULTY WIO:[id number] DOB: Mar 02, 1951 DOA: 01/22/2017 PCP: [name], MD   Interim summary and history of presenting illness 66 y.o. male with medical history significant of CAD, chronic atrial fibrillation, hyperlipidemia, hypertension, chronic respiratory failure and uses 2 L as needed at home, chronic diastolic heart failure, morbid obesity who presents with progressively worsening bilateral leg swelling, pain, increased redness, as well as shortness of breath. He states that his legs have gotten recurrent cellulitis when he gets fluid overloaded, and he picks at the blisters that form on his legs. He denies any recent fevers or chills, no chest pain. No nausea, vomiting or diarrhea or abdominal pain. He does state that he sleeps on a recliner at baseline, and sometimes uses his home oxygen as needed. He did not take his diuretic at home yesterday or today, but does state that he has been urinating adequately.  Assessment/Plan: 1-acute on chronic diastolic heart failure -Continue daily weights and strict intake and output -Follow 2-D echo results -Will continue Aldactone and will give IV Lasix (80 mg IV twice a day) -Patient at home uses torsemide twice a day and metolazone twice a week -Report some diet indiscretion and have not take his diuretic for the last 2 days.  2-sepsis secondary to bilateral lower strandy cellulitis -Continue IV antibiotics -Wound care service has been consulted -legs elevation and supportive care  3-chronic kidney disease stage III -Currently at baseline -Will keep close follow-up of renal function trend during acute diuresis  4-chronic anemia -Appears to be anemia of chronic kidney disease -No signs of acute bleeding -Hemoglobin stable (baseline 9-10) -No need for transfusion -Will follow trend.  5-atrial fibrillation: Chronic -CHADsVASC score 4 -Continue Xarelto -Will monitor on telemetry -Rate  controlled  6-coronary artery disease -No complaining of chest pain -Will continue aspirin -Will follow echo  7-hyperlipidemia -Continue statins  8-diabetes mellitus type 2 uncontrolled with hyperglycemia and nephropathy -Will follow A1c -Continue sliding scale insulin and meal coverage -Lantus adjusted to 80 units twice a day for better control of his sugar  9-hypertension -Will continue current antihypertensive regimen. -Heart healthy diet  10-chronic respiratory failure/obstructive sleep apnea -Patient intolerant to use CPAP, essentially sleeps on a recliner -Continue 2 L nasal cannula oxygen supplementation as he uses at home.  11-morbidly obesity -Body mass index is 48.53 kg/m. -Low calorie diet has been discussed with patient  12-physical deconditioning and bilateral knee pain.  -excruciating pain that is preventing him to walk stand or bend knees bilaterally.  -Physical exam decreased range of motion and very little swelling appreciated of his joints. -X-ray does demonstrate moderate Osteoarthritis with chondrocalcinosis  -Will give treatment with prednisone low dose for 5 days and continue analgesic therapy.  -Patient have outpatient follow-up with orthopedic service to further assess workup and future treatment for his condition.   Code Status: Full code Family Communication: No family at bedside Disposition Plan: Remain inpatient, will continue IV antibiotics and IV diuresis for now, will follow wound care services port dressing instructions; follow daily weight and strict intake and output. continue wound care as per WOC recommendations. Physiotherapy has margin down as someone that will need rehabilitation facility short term of discharge.  Consultants: WOC  Procedures:  See below for x-ray report  Left knee and right knee x-ray does: Demonstrating moderate to severe chondrocalcinosis, with trace of suprapatellar  effusion  Antibiotics:  Ceftriaxone  HPI/Subjective: Patient spiked fever overnight, currently denies any chest pain, nausea, vomiting or abdominal  pain. He endorses some orthopnea and significant bilateral knees discomfort which are impairing his ability of daily moment.  Objective: Vitals:   01/23/17 0644 01/23/17 1332  BP:  (!) 111/49  Pulse:  76  Resp:  20  Temp: 99.9 F (37.7 C) 98.3 F (36.8 C)    Intake/Output Summary (Last 24 hours) at 01/23/17 1615 Last data filed at 01/23/17 1248  Gross per 24 hour  Intake                0 ml  Output             3600 ml  Net            -3600 ml   Filed Weights   01/22/17 0035 01/22/17 0846 01/23/17 0500  Weight: (!) 158.8 kg (350 lb) (!) 164.4 kg (362 lb 7 oz) (!) 162.3 kg (357 lb 12.9 oz)    Exam:   General: Afebrile, no chest pain, no nausea or vomiting. Patient endorses significant bilateral knee pain and also some orthopnea.  Cardiovascular: S1 and S2, no rubs, no gallops; mild JVD appreciated on exam.  Respiratory: Wearing nasal cannula oxygen supplementation, no wheezing or rhonchi. Fine crackles at the bases. No using accessory muscles  Abdomen: Soft, nontender, nondistended, positive bowel sounds  Musculoskeletal: 2+ edema bilaterally, no clubbing, patient with multiple scabs and especially open wound on his right lower extremity with active drainage and mild malodorous smell.   Data Reviewed: Basic Metabolic Panel:  Recent Labs Lab 01/22/17 0316 01/22/17 1212 01/22/17 1702 01/23/17 0515  NA 133* 132*  --  137  K 3.8 4.1  --  3.3*  CL 91* 91*  --  93*  CO2 32 32  --  33*  GLUCOSE 398* 536* 476* 228*  BUN 21* 21*  --  19  CREATININE 1.34* 1.35*  --  1.30*  CALCIUM 8.0* 7.9*  --  8.3*   Liver Function Tests:  Recent Labs Lab 01/22/17 0316  AST 23  ALT 13*  ALKPHOS 66  BILITOT 1.3*  PROT 6.6  ALBUMIN 2.7*   CBC:  Recent Labs Lab 01/22/17 0316 01/23/17 0515  WBC 16.0* 17.1*  NEUTROABS  12.3*  --   HGB 8.4* 8.9*  HCT 26.9* 28.7*  MCV 73.1* 74.4*  PLT 350 356   BNP (last 3 results)  Recent Labs  06/13/16 0324 06/14/16 0303 01/22/17 0509  BNP 137.5* 101.8* 183.2*    CBG:  Recent Labs Lab 01/22/17 1138 01/22/17 1625 01/22/17 2133 01/23/17 0716 01/23/17 1155  GLUCAP 539* 442* 317* 233* 242*    Studies: Dg Chest 2 View  Result Date: 01/22/2017 CLINICAL DATA:  Shortness of breath. Possible lower extremity cellulitis. EXAM: CHEST  2 VIEW COMPARISON:  06/09/2016. FINDINGS: Mildly progressive enlargement of the cardiac silhouette and prominence of the pulmonary vasculature. No pulmonary edema or pleural fluid seen. Thoracic spine degenerative changes. Aortic arch calcifications. IMPRESSION: Mildly progressive cardiomegaly with interval mild pulmonary vascular congestion. Electronically Signed   By: Beckie Salts M.D.   On: 01/22/2017 08:05   Dg Knee Complete 4 Views Left  Result Date: 01/22/2017 CLINICAL DATA:  Bilateral knee pain EXAM: LEFT KNEE - COMPLETE 4+ VIEW COMPARISON:  11/27/2014 FINDINGS: No acute fracture or malalignment. Mild to moderate patellofemoral degenerative changes with bony spurring. Trace suprapatellar effusion. Moderate degenerative changes of the lateral compartment and mild to moderate changes of the medial compartment. Chondrocalcinosis. IMPRESSION: 1. Mild to moderate arthritis of the left knee with trace  suprapatellar effusion 2. Chondrocalcinosis Electronically Signed   By: Jasmine Pang M.D.   On: 01/22/2017 03:59   Dg Knee Complete 4 Views Right  Result Date: 01/22/2017 CLINICAL DATA:  Bilateral knee pain EXAM: RIGHT KNEE - COMPLETE 4+ VIEW COMPARISON:  11/27/2014 FINDINGS: Chondrocalcinosis. No fracture or dislocation. Moderate to marked patellofemoral degenerative changes with sclerosis and osteophyte. Moderate degenerative changes of the medial compartment and mild to moderate degenerative changes of the lateral compartment. Vascular  calcifications. IMPRESSION: 1. Arthritis of the right knee, most severe involving the patellofemoral compartment. No acute osseous abnormality. 2. Chondrocalcinosis Electronically Signed   By: Jasmine Pang M.D.   On: 01/22/2017 04:01    Scheduled Meds: . aspirin EC  81 mg Oral Daily  . atorvastatin  80 mg Oral Daily  . furosemide  80 mg Intravenous BID  . gabapentin  600 mg Oral BID  . insulin aspart  0-20 Units Subcutaneous TID WC  . insulin aspart  0-5 Units Subcutaneous QHS  . insulin aspart  6 Units Subcutaneous TID WC  . insulin glargine  80 Units Subcutaneous QHS   And  . [START ON 01/24/2017] insulin glargine  80 Units Subcutaneous Daily  . metoprolol tartrate  50 mg Oral BID  . [START ON 01/24/2017] predniSONE  30 mg Oral Q breakfast  . rivaroxaban  20 mg Oral Q supper  . sodium chloride flush  3 mL Intravenous Q12H  . spironolactone  25 mg Oral Daily   Continuous Infusions: . cefTRIAXone (ROCEPHIN)  IV Stopped (01/23/17 1046)     Time spent: 30 minutes   [name]  [name] Pager [phone]. If 7PM-7AM, please contact night-coverage at [email or web address], password [facility] 01/23/2017, 4:15 PM  LOS: 1 day

## 2017-01-24 LAB — BASIC METABOLIC PANEL
Anion gap: 10 (ref 5–15)
BUN: 18 mg/dL (ref 6–20)
CALCIUM: 8.1 mg/dL — AB (ref 8.9–10.3)
CO2: 33 mmol/L — AB (ref 22–32)
CREATININE: 1.35 mg/dL — AB (ref 0.61–1.24)
Chloride: 93 mmol/L — ABNORMAL LOW (ref 101–111)
GFR calc Af Amer: >60 mL/min (ref >60)
GFR calc non Af Amer: 54 mL/min — ABNORMAL LOW (ref >60)
GLUCOSE: 217 mg/dL — AB (ref 65–99)
Potassium: 3.3 mmol/L — ABNORMAL LOW (ref 3.5–5.1)
Sodium: 136 mmol/L (ref 135–145)

## 2017-01-24 LAB — GLUCOSE, CAPILLARY
GLUCOSE-CAPILLARY: 398 mg/dL — AB (ref 65–99)
Glucose-Capillary: 187 mg/dL — ABNORMAL HIGH (ref 65–99)
Glucose-Capillary: 259 mg/dL — ABNORMAL HIGH (ref 65–99)
Glucose-Capillary: 354 mg/dL — ABNORMAL HIGH (ref 65–99)

## 2017-01-24 MED ORDER — HYDROCERIN EX CREA
TOPICAL_CREAM | Freq: Two times a day (BID) | CUTANEOUS | Status: DC
  Administered 2017-01-24 – 2017-01-25 (×2): via TOPICAL
  Filled 2017-01-24: qty 113

## 2017-01-24 MED ORDER — ACETAMINOPHEN 500 MG PO TABS
1000.0000 mg | ORAL_TABLET | Freq: Three times a day (TID) | ORAL | Status: DC
  Administered 2017-01-24 – 2017-01-25 (×4): 1000 mg via ORAL
  Filled 2017-01-24 (×4): qty 2

## 2017-01-24 NOTE — Progress Notes (Signed)
[name] PROGRESS NOTE  [name] EVO:[id number] DOB: 10-07-1950 DOA: 01/22/2017 PCP: [name], MD   Interim summary and history of presenting illness 66 y.o. male with medical history significant of CAD, chronic atrial fibrillation, hyperlipidemia, hypertension, chronic respiratory failure and uses 2 L as needed at home, chronic diastolic heart failure, morbid obesity who presents with progressively worsening bilateral leg swelling, pain, increased redness, as well as shortness of breath. He states that his legs have gotten recurrent cellulitis when he gets fluid overloaded, and he picks at the blisters that form on his legs. He denies any recent fevers or chills, no chest pain. No nausea, vomiting or diarrhea or abdominal pain. He does state that he sleeps on a recliner at baseline, and sometimes uses his home oxygen as needed. He did not take his diuretic at home yesterday or today, but does state that he has been urinating adequately.  Assessment/Plan: 1-acute on chronic diastolic heart failure -Continue daily weights and strict intake and output -2-D echo demonstrating diastolic heart failure with preserved ejection fraction and no wall motion abnormalities. -Will continue Aldactone and will give IV Lasix (80 mg IV twice a day) for another 24 hours. After that plan is to resume his home medication regimen. -Patient at home uses torsemide twice a day and metolazone twice a week -Report some diet indiscretion and have not take his diuretic for the last 2 days prior to admission.  2-sepsis secondary to bilateral lower strandy cellulitis -Continue IV antibiotics -Wound care service has been consulted; appreciate inputs and will follow recommendations -Continue also legs elevation and patient has been advised not to scratch or to pick on his scabs.  3-chronic kidney disease stage III -Renal function remains at baseline -Will keep close follow-up of renal function trend  during acute diuresis  4-chronic anemia -Appears to be anemia of chronic kidney disease -No signs of acute bleeding -Hemoglobin stable (baseline 9-10) -No need for transfusion currently; will continue monitoring hemoglobin trend.   5-atrial fibrillation: Chronic -CHADsVASC score 4 -Continue Xarelto -Will monitor on telemetry -Rate controlled  6-coronary artery disease -No complaining of chest pain -Will continue aspirin -Echo reassuring and unchanged, in comparison to prior study.  7-hyperlipidemia -Continue statins  8-diabetes mellitus type 2 uncontrolled with hyperglycemia and nephropathy -Recent A1c 8.2 -Continue sliding scale insulin and meal coverage -Will follow CBG and adjust his hypoglycemic regimen treatment as needed, anticipating increasing his CBGs with the use of steroids.  9-hypertension -Will continue current antihypertensive regimen. -Continue Heart healthy diet  10-chronic respiratory failure/obstructive sleep apnea -Patient intolerant to use CPAP, essentially sleeps on a recliner -Continue 2 L nasal cannula oxygen supplementation as he uses at home.  11-morbidly obesity -Body mass index is 48.44 kg/m. -Low calorie diet has been discussed with patient  12-physical deconditioning and bilateral knee pain.  -excruciating pain that is preventing him to walk stand or bend knees bilaterally.  -Physical exam decreased range of motion and very little swelling appreciated of his joints. -X-ray does demonstrate moderate Osteoarthritis with chondrocalcinosis  -Will will continue empirical management with the use of prednisone 20 mg daily for 5 days (day 2 out of 5). Continue pain medications. -Physical therapy has evaluated patient and recommended nursing home for rehabilitation. -Patient have outpatient follow-up with orthopedic service to further assess workup and future treatment for his condition.   Code Status: Full code Family Communication: No family at  bedside Disposition Plan: Remain inpatient, will continue IV antibiotics and IV diuresis for now,  will follow wound care services port dressing instructions; follow daily weight and strict intake and output. continue wound care as per WOC recommendations. Physiotherapy has margin down as someone that will need rehabilitation facility short term of discharge.  Consultants: WOC  Procedures:  See below for x-ray report  Left knee and right knee x-ray does: Demonstrating moderate to severe chondrocalcinosis, with trace of suprapatellar effusion  2-D echo - Left ventricle: The cavity size was normal. There was mild   concentric hypertrophy. Systolic function was normal. The   estimated ejection fraction was in the range of 60% to 65%. Wall   motion was normal; there were no regional wall motion   abnormalities. The study is not technically sufficient to allow   evaluation of LV diastolic function. - Aortic valve: Trileaflet; mildly thickened, mildly calcified   leaflets. - Mitral valve: Calcified annulus.  Impressions:  - Compared to the prior study, there has been no significant   interval change.  Antibiotics:  Ceftriaxone  HPI/Subjective: Afebrile, no chest pain, no shortness of breath, overall feeling better. Patient complaining of bilateral knee pain.  Objective: Vitals:   01/24/17 0542 01/24/17 1434  BP: (!) 129/58 (!) 118/49  Pulse: 86 75  Resp: 20 19  Temp: 99.2 F (37.3 C) 98.2 F (36.8 C)    Intake/Output Summary (Last 24 hours) at 01/24/17 1654 Last data filed at 01/24/17 1435  Gross per 24 hour  Intake             1063 ml  Output             1325 ml  Net             -262 ml   Filed Weights   01/22/17 0846 01/23/17 0500 01/24/17 0542  Weight: (!) 164.4 kg (362 lb 7 oz) (!) 162.3 kg (357 lb 12.9 oz) (!) 162 kg (357 lb 2.3 oz)    Exam:   General: Afebrile, denies chest pain and nausea/vomiting. Patient continued to express pain on his knees  bilaterally. Reports that his breathing is improved/back to baseline; continue to use oxygen supplementation intermittently as he feels like it (same way that he uses at home, mainly at bedtime).  Cardiovascular: S1 and S2, no rubs, no gallops, no frank JVD appreciated on exam today.   Respiratory: Good oxygen saturation on room air during this visit, patient without tachypnea, normal respiratory effort and no using accessory muscles. No wheezing, no crackles appreciated on exam.  Abdomen: Obese, soft, nontender, nondistended, positive bowel sounds  Musculoskeletal: 1+ edema bilaterally. There is no cyanosis or clubbing. Multiple scabbed appreciated especially on his right lower extremity where he had a wound on one of this Has Been Opening Now with Mild Serosanguineous Drainage.    Data Reviewed: Basic Metabolic Panel:  Recent Labs Lab 01/22/17 0316 01/22/17 1212 01/22/17 1702 01/23/17 0515 01/24/17 0451  NA 133* 132*  --  137 136  K 3.8 4.1  --  3.3* 3.3*  CL 91* 91*  --  93* 93*  CO2 32 32  --  33* 33*  GLUCOSE 398* 536* 476* 228* 217*  BUN 21* 21*  --  19 18  CREATININE 1.34* 1.35*  --  1.30* 1.35*  CALCIUM 8.0* 7.9*  --  8.3* 8.1*   Liver Function Tests:  Recent Labs Lab 01/22/17 0316  AST 23  ALT 13*  ALKPHOS 66  BILITOT 1.3*  PROT 6.6  ALBUMIN 2.7*   CBC:  Recent Labs Lab  01/22/17 0316 01/23/17 0515  WBC 16.0* 17.1*  NEUTROABS 12.3*  --   HGB 8.4* 8.9*  HCT 26.9* 28.7*  MCV 73.1* 74.4*  PLT 350 356   BNP (last 3 results)  Recent Labs  06/13/16 0324 06/14/16 0303 01/22/17 0509  BNP 137.5* 101.8* 183.2*    CBG:  Recent Labs Lab 01/23/17 1658 01/23/17 2158 01/24/17 0723 01/24/17 1135 01/24/17 1639  GLUCAP 270* 309* 187* 259* 354*    Studies: No results found.  Scheduled Meds: . acetaminophen  1,000 mg Oral Q8H  . aspirin EC  81 mg Oral Daily  . atorvastatin  80 mg Oral Daily  . furosemide  80 mg Intravenous BID  . gabapentin   600 mg Oral BID  . insulin aspart  0-20 Units Subcutaneous TID WC  . insulin aspart  0-5 Units Subcutaneous QHS  . insulin aspart  6 Units Subcutaneous TID WC  . insulin glargine  80 Units Subcutaneous QHS   And  . insulin glargine  80 Units Subcutaneous Daily  . metoprolol tartrate  50 mg Oral BID  . predniSONE  30 mg Oral Q breakfast  . rivaroxaban  20 mg Oral Q supper  . sodium chloride flush  3 mL Intravenous Q12H  . spironolactone  25 mg Oral Daily   Continuous Infusions: . cefTRIAXone (ROCEPHIN)  IV Stopped (01/24/17 1145)     Time spent: 25 minutes   [name]  [name] Pager [phone]. If 7PM-7AM, please contact night-coverage at [email or web address], password [facility] 01/24/2017, 4:54 PM  LOS: 2 days

## 2017-01-24 NOTE — Progress Notes (Signed)
Inpatient Diabetes Program Recommendations  AACE/ADA: New Consensus Statement on Inpatient Glycemic Control (2015)  Target Ranges:  Prepandial:   less than 140 mg/dL      Peak postprandial:   less than 180 mg/dL (1-2 hours)      Critically ill patients:  140 - 180 mg/dL   Lab Results  Component Value Date   GLUCAP 187 (H) 01/24/2017   HGBA1C 8.2 (H) 12/21/2016    Review of Glycemic Control  Pt states he ate about 90% of his breakfast this morning. States he had a lot of hypoglycemia at home with the U-500 20 units with meals. Blood sugars much improved this am.  Will follow post-prandial blood sugars closely for needed adjustments.  Continue to follow.   Thank you. Ailene Ards, RD, LDN, CDE Inpatient Diabetes Coordinator [phone]

## 2017-01-24 NOTE — Consult Note (Signed)
WOC Nurse wound consult note Reason for Consult: Scattered scabbed lesions to bilateral lower extremities.  Chronic. Patient scratches the irritation and scabbed lesions form.  No erythema or signs of infection.  WIll begin moisturizing agent.  Wound type:Inflammatory, chronic Pressure Injury POA: NA Measurement: Scattered scabbed lesions present to bilateral lower legs circumferentially.  Wound SWN:IOEVOJJ Drainage (amount, consistency, odor) None noted Periwound:Dry skin, chronic skin changes.  Dressing procedure/placement/frequency:Cleanse bilateral lower legs with soap and water and pat dry.  Apply Eucerin cream twice daily to moisturize and soothe.   Will not follow at this time.  Please re-consult if needed.  Maple Hudson RN BSN CWON Pager [phone]

## 2017-01-24 NOTE — Care Management Note (Signed)
Case Management Note  Patient Details  Name: [name] MRN: [id number] Date of Birth: [birth year]  Subjective/Objective: 66 y/o m admitted w/Acute on chronic CHF. From home. PT/OT-SNF. CSW following for SNF.                   Action/Plan:d/c SNF.   Expected Discharge Date:                  Expected Discharge Plan:  Skilled Nursing Facility  In-House Referral:  Clinical Social Work  Discharge planning Services  CM Consult  Post Acute Care Choice:    Choice offered to:     DME Arranged:    DME Agency:     HH Arranged:    HH Agency:     Status of Service:  In process, will continue to follow  If discussed at Long Length of Stay Meetings, dates discussed:    Additional Comments:  Lanier Clam, RN 01/24/2017, 2:53 PM

## 2017-01-25 ENCOUNTER — Other Ambulatory Visit: Payer: Medicare Other

## 2017-01-25 DIAGNOSIS — N179 Acute kidney failure, unspecified: Secondary | ICD-10-CM | POA: Diagnosis not present

## 2017-01-25 DIAGNOSIS — L03116 Cellulitis of left lower limb: Secondary | ICD-10-CM | POA: Diagnosis not present

## 2017-01-25 DIAGNOSIS — L97419 Non-pressure chronic ulcer of right heel and midfoot with unspecified severity: Secondary | ICD-10-CM | POA: Diagnosis not present

## 2017-01-25 DIAGNOSIS — M170 Bilateral primary osteoarthritis of knee: Secondary | ICD-10-CM

## 2017-01-25 DIAGNOSIS — I5030 Unspecified diastolic (congestive) heart failure: Secondary | ICD-10-CM | POA: Diagnosis not present

## 2017-01-25 DIAGNOSIS — Z9181 History of falling: Secondary | ICD-10-CM | POA: Diagnosis not present

## 2017-01-25 DIAGNOSIS — I2510 Atherosclerotic heart disease of native coronary artery without angina pectoris: Secondary | ICD-10-CM | POA: Diagnosis not present

## 2017-01-25 DIAGNOSIS — M6282 Rhabdomyolysis: Secondary | ICD-10-CM | POA: Diagnosis not present

## 2017-01-25 DIAGNOSIS — G4733 Obstructive sleep apnea (adult) (pediatric): Secondary | ICD-10-CM | POA: Diagnosis not present

## 2017-01-25 DIAGNOSIS — N183 Chronic kidney disease, stage 3 (moderate): Secondary | ICD-10-CM | POA: Diagnosis not present

## 2017-01-25 DIAGNOSIS — I4891 Unspecified atrial fibrillation: Secondary | ICD-10-CM | POA: Diagnosis not present

## 2017-01-25 DIAGNOSIS — M549 Dorsalgia, unspecified: Secondary | ICD-10-CM | POA: Diagnosis not present

## 2017-01-25 DIAGNOSIS — J9691 Respiratory failure, unspecified with hypoxia: Secondary | ICD-10-CM | POA: Diagnosis not present

## 2017-01-25 DIAGNOSIS — M25511 Pain in right shoulder: Secondary | ICD-10-CM | POA: Diagnosis not present

## 2017-01-25 DIAGNOSIS — L0390 Cellulitis, unspecified: Secondary | ICD-10-CM | POA: Diagnosis not present

## 2017-01-25 DIAGNOSIS — N492 Inflammatory disorders of scrotum: Secondary | ICD-10-CM | POA: Diagnosis not present

## 2017-01-25 DIAGNOSIS — M25569 Pain in unspecified knee: Secondary | ICD-10-CM | POA: Diagnosis not present

## 2017-01-25 DIAGNOSIS — G894 Chronic pain syndrome: Secondary | ICD-10-CM | POA: Diagnosis not present

## 2017-01-25 DIAGNOSIS — R2681 Unsteadiness on feet: Secondary | ICD-10-CM | POA: Diagnosis not present

## 2017-01-25 DIAGNOSIS — E785 Hyperlipidemia, unspecified: Secondary | ICD-10-CM | POA: Diagnosis not present

## 2017-01-25 DIAGNOSIS — Z23 Encounter for immunization: Secondary | ICD-10-CM | POA: Diagnosis not present

## 2017-01-25 DIAGNOSIS — R262 Difficulty in walking, not elsewhere classified: Secondary | ICD-10-CM | POA: Diagnosis not present

## 2017-01-25 DIAGNOSIS — E1121 Type 2 diabetes mellitus with diabetic nephropathy: Secondary | ICD-10-CM

## 2017-01-25 DIAGNOSIS — R5381 Other malaise: Secondary | ICD-10-CM | POA: Diagnosis not present

## 2017-01-25 DIAGNOSIS — I5033 Acute on chronic diastolic (congestive) heart failure: Secondary | ICD-10-CM | POA: Diagnosis not present

## 2017-01-25 DIAGNOSIS — R2689 Other abnormalities of gait and mobility: Secondary | ICD-10-CM | POA: Diagnosis not present

## 2017-01-25 DIAGNOSIS — E11319 Type 2 diabetes mellitus with unspecified diabetic retinopathy without macular edema: Secondary | ICD-10-CM | POA: Diagnosis not present

## 2017-01-25 DIAGNOSIS — K922 Gastrointestinal hemorrhage, unspecified: Secondary | ICD-10-CM | POA: Diagnosis not present

## 2017-01-25 DIAGNOSIS — I10 Essential (primary) hypertension: Secondary | ICD-10-CM | POA: Diagnosis not present

## 2017-01-25 DIAGNOSIS — I5040 Unspecified combined systolic (congestive) and diastolic (congestive) heart failure: Secondary | ICD-10-CM | POA: Diagnosis not present

## 2017-01-25 DIAGNOSIS — M6281 Muscle weakness (generalized): Secondary | ICD-10-CM | POA: Diagnosis not present

## 2017-01-25 DIAGNOSIS — M1711 Unilateral primary osteoarthritis, right knee: Secondary | ICD-10-CM | POA: Diagnosis not present

## 2017-01-25 DIAGNOSIS — E1165 Type 2 diabetes mellitus with hyperglycemia: Secondary | ICD-10-CM | POA: Diagnosis not present

## 2017-01-25 LAB — CBC WITH DIFFERENTIAL/PLATELET
Basophils Absolute: 0 10*3/uL (ref 0.0–0.1)
Basophils Relative: 0 %
EOS PCT: 2 %
Eosinophils Absolute: 0.3 10*3/uL (ref 0.0–0.7)
HEMATOCRIT: 29.1 % — AB (ref 39.0–52.0)
Hemoglobin: 9.1 g/dL — ABNORMAL LOW (ref 13.0–17.0)
LYMPHS ABS: 2 10*3/uL (ref 0.7–4.0)
LYMPHS PCT: 11 %
MCH: 23.2 pg — AB (ref 26.0–34.0)
MCHC: 31.3 g/dL (ref 30.0–36.0)
MCV: 74.2 fL — AB (ref 78.0–100.0)
MONO ABS: 0.7 10*3/uL (ref 0.1–1.0)
MONOS PCT: 4 %
Neutro Abs: 15.6 10*3/uL — ABNORMAL HIGH (ref 1.7–7.7)
Neutrophils Relative %: 83 %
PLATELETS: 363 10*3/uL (ref 150–400)
RBC: 3.92 MIL/uL — ABNORMAL LOW (ref 4.22–5.81)
RDW: 18.2 % — AB (ref 11.5–15.5)
WBC: 18.6 10*3/uL — ABNORMAL HIGH (ref 4.0–10.5)

## 2017-01-25 LAB — GLUCOSE, CAPILLARY
GLUCOSE-CAPILLARY: 461 mg/dL — AB (ref 65–99)
Glucose-Capillary: 373 mg/dL — ABNORMAL HIGH (ref 65–99)
Glucose-Capillary: 332 mg/dL — ABNORMAL HIGH (ref 65–99)

## 2017-01-25 MED ORDER — INSULIN GLARGINE 100 UNIT/ML ~~LOC~~ SOLN: 70.0000 [IU] | Freq: Two times a day (BID) | SUBCUTANEOUS | Status: DC

## 2017-01-25 MED ORDER — OXYCODONE HCL 30 MG PO TABS: 30.0000 mg | ORAL_TABLET | ORAL | 0 refills | Status: DC | PRN

## 2017-01-25 MED ORDER — INSULIN ASPART 100 UNIT/ML ~~LOC~~ SOLN
30.0000 [IU] | Freq: Once | SUBCUTANEOUS | Status: AC
  Administered 2017-01-25: 30 [IU] via SUBCUTANEOUS

## 2017-01-25 MED ORDER — HYPROMELLOSE 0.4 % OP SOLN: 2.0000 [drp] | Freq: Every day | OPHTHALMIC | Status: DC | PRN

## 2017-01-25 MED ORDER — DOCUSATE SODIUM 100 MG PO CAPS
100.0000 mg | ORAL_CAPSULE | Freq: Every day | ORAL | Status: DC
Start: 2017-01-26 — End: 2017-09-13

## 2017-01-25 MED ORDER — INSULIN GLARGINE 100 UNIT/ML ~~LOC~~ SOLN
85.0000 [IU] | Freq: Every day | SUBCUTANEOUS | Status: DC
  Filled 2017-01-25: qty 0.85

## 2017-01-25 MED ORDER — POLYETHYLENE GLYCOL 3350 17 G PO PACK
17.0000 g | PACK | Freq: Every day | ORAL | Status: DC
  Administered 2017-01-25: 17 g via ORAL
  Filled 2017-01-25: qty 1

## 2017-01-25 MED ORDER — CEPHALEXIN 500 MG PO CAPS
500.0000 mg | ORAL_CAPSULE | Freq: Three times a day (TID) | ORAL | Status: DC
  Administered 2017-01-25: 500 mg via ORAL
  Filled 2017-01-25: qty 1

## 2017-01-25 MED ORDER — PREDNISONE 10 MG PO TABS: 30.0000 mg | ORAL_TABLET | Freq: Every day | ORAL | Status: AC

## 2017-01-25 MED ORDER — CEPHALEXIN 500 MG PO CAPS: 500.0000 mg | ORAL_CAPSULE | Freq: Three times a day (TID) | ORAL | Status: AC

## 2017-01-25 MED ORDER — INSULIN GLARGINE 100 UNIT/ML ~~LOC~~ SOLN
80.0000 [IU] | Freq: Every day | SUBCUTANEOUS | Status: DC
  Administered 2017-01-25: 80 [IU] via SUBCUTANEOUS
  Filled 2017-01-25: qty 0.8

## 2017-01-25 MED ORDER — DOCUSATE SODIUM 100 MG PO CAPS
100.0000 mg | ORAL_CAPSULE | Freq: Every day | ORAL | Status: DC
  Administered 2017-01-25: 100 mg via ORAL
  Filled 2017-01-25: qty 1

## 2017-01-25 MED ORDER — POLYETHYLENE GLYCOL 3350 17 G PO PACK: 17.0000 g | PACK | Freq: Every day | ORAL | 0 refills | Status: DC

## 2017-01-25 MED ORDER — POLYVINYL ALCOHOL 1.4 % OP SOLN
2.0000 [drp] | Freq: Every day | OPHTHALMIC | Status: DC | PRN
  Administered 2017-01-25: 2 [drp] via OPHTHALMIC
  Filled 2017-01-25: qty 15

## 2017-01-25 NOTE — Clinical Social Work Placement (Signed)
Patient received and accepted bed offer at [facility]. PTAR contacted, family notified. Patient's RN can call report to [phone].  CLINICAL SOCIAL WORK PLACEMENT  NOTE  Date:  01/25/2017  Patient Details  Name: [name] MRN: [id number] Date of Birth: 1951/06/01  Clinical Social Work is seeking post-discharge placement for this patient at the Skilled  Nursing Facility level of care (*CSW will initial, date and re-position this form in  chart as items are completed):  Yes   Patient/family provided with [place] Clinical Social Work Department's list of facilities offering this level of care within the geographic area requested by the patient (or if unable, by the patient's family).  Yes   Patient/family informed of their freedom to choose among providers that offer the needed level of care, that participate in Medicare, Medicaid or managed care program needed by the patient, have an available bed and are willing to accept the patient.  Yes   Patient/family informed of [facility]'s ownership interest in [facility] and [facility], as well as of the fact that they are under no obligation to receive care at these facilities.  PASRR submitted to EDS on       PASRR number received on       Existing PASRR number confirmed on 01/25/17     FL2 transmitted to all facilities in geographic area requested by pt/family on 01/25/17     FL2 transmitted to all facilities within larger geographic area on       Patient informed that his/her managed care company has contracts with or will negotiate with certain facilities, including the following:        Yes   Patient/family informed of bed offers received.  Patient chooses bed at [facility]     Physician recommends and patient chooses bed at      Patient to be transferred to [facility] on 01/25/17.  Patient to be transferred to facility by PTAR     Patient family notified on 01/25/17 of  transfer.  Name of family member notified:  Deidre Ala     PHYSICIAN       Additional Comment:    _______________________________________________ Antionette Poles, LCSW 01/25/2017, 4:23 PM

## 2017-01-25 NOTE — [place] FL2 (Signed)
[place] MEDICAID FL2 LEVEL OF CARE SCREENING TOOL     IDENTIFICATION  Patient Name: [name] Birthdate: August 14, 1950 Sex: male Admission Date (Current Location): 01/22/2017  [facility] and [place] Number:  Producer, television/film/video and Address:  [facility],  [address] [place], [place] [address]      Provider Number: [id number]  Attending Physician Name and Address:  [name], MD  Relative Name and Phone Number:  Sister [name] [phone]    Current Level of Care: Hospital Recommended Level of Care: Skilled Nursing Facility Prior Approval Number:    Date Approved/Denied:   PASRR Number:  [id number]  Discharge Plan: SNF    Current Diagnoses: Patient Active Problem List   Diagnosis Date Noted  . Bilateral lower leg cellulitis 01/22/2017  . Venous stasis 08/06/2016  . At high risk for falls 07/21/2016  . CHF exacerbation (HCC) 06/10/2016  . Lactic acidosis 06/09/2016  . Syncope and collapse 03/05/2016  . Acute on chronic renal insufficiency 03/05/2016  . Obesity 12/30/2015  . Scrotal swelling   . Foot fracture   . 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 Lower GI bleed   . Pressure ulcer 12/24/2015  . Rhabdomyolysis   . Acute kidney injury (HCC) 12/23/2015  . Back pain 12/23/2015  . Fall   . Metatarsal fracture   . Traumatic rhabdomyolysis (HCC)   . Hyperkalemia   . Diabetes mellitus type 2 in obese (HCC)   . Lisfranc dislocation   . Hyperglycemia   . Sepsis (HCC) 09/08/2015  . Scrotal wall abscess 09/08/2015  . AKI (acute kidney injury) (HCC) 09/08/2015  . Chest wall pain   . On amiodarone therapy 12/08/2014  . Respiratory failure with hypoxia (HCC) 12/04/2014  . Hypoxia 12/04/2014  . Acute on chronic respiratory failure with hypoxia (HCC)   . Atrial fibrillation with RVR (HCC) 09/20/2014  . Acute on chronic diastolic CHF (congestive heart failure) (HCC) 09/19/2014  . Atrial fibrillation with rapid ventricular  response (HCC) 09/19/2014  . Acute respiratory failure (HCC) 09/19/2014  . Abnormal nuclear cardiac imaging test 02/12/2014  . Dyspnea 02/12/2014  . Chronic anticoagulation- Coumadin 02/12/2014  . CKD (chronic kidney disease) stage 3, GFR 30-59 ml/min 02/12/2014  . Chronic diastolic congestive heart failure (HCC) 02/11/2014  . Encounter for therapeutic drug monitoring 08/03/2013  . Pilonidal cyst 01/25/2013  . Perirectal cellulitis 09/06/2012  . [place] Sant term (current) use of anticoagulants 11/18/2011  . Obstructive sleep apnea- unable to tolerate c-pap 08/04/2011  . CAD S/P percutaneous coronary angioplasty 08/04/2011  . Diabetes mellitus type 2, uncontrolled, with complications (HCC) 01/06/2009  . ANEMIA-IRON DEFICIENCY 01/06/2009  . OTHER AND UNSPECIFIED COAGULATION DEFECTS 01/06/2009  . RECTAL BLEEDING 01/06/2009  . THYROID STIMULATING HORMONE, ABNORMAL 01/02/2009  . ANEMIA 01/02/2009  . CARDIOVASCULAR FUNCTION STUDY, ABNORMAL 01/02/2009  . Essential hypertension, benign 10/22/2008  . Atrial fibrillation 10/22/2008  . DIAB W/UNSPEC COMP TYPE II/UNSPEC TYPE UNCNTRL 09/23/2008  . Hyperlipidemia 09/23/2008  . HYPOKALEMIA 09/23/2008  . OBESITY-MORBID BMI 54 09/23/2008  . CELLULITIS AND ABSCESS OF LEG EXCEPT FOOT 09/23/2008    Orientation RESPIRATION BLADDER Height & Weight     Self, Time, Situation, Place  O2 Continent, External catheter Weight: (!) 357 lb 2.3 oz (162 kg) Height:  6' (182.9 cm)  BEHAVIORAL SYMPTOMS/MOOD NEUROLOGICAL BOWEL NUTRITION STATUS        Diet (Heart Healthy/Carb Modified)  AMBULATORY STATUS COMMUNICATION OF NEEDS Skin   Extensive Assist Verbally Other (Comment) (Scattered scabbed  lesions to bilateral lower extremities.  Chronic. Patient scratches the irritation and scabbed lesions form.  No erythema or signs of infection.  WIll begin moisturizing agent. )                       Personal Care Assistance Level of Assistance  Bathing, Feeding, Dressing  Bathing Assistance: Limited assistance Feeding assistance: Independent Dressing Assistance: Limited assistance     Functional Limitations Info             SPECIAL CARE FACTORS FREQUENCY  PT (By licensed PT), OT (By licensed OT)     PT Frequency: 5x/week OT Frequency: 5x/week            Contractures Contractures Info: Not present    Additional Factors Info  Code Status Code Status Info: Full Code Allergies Info: NKA           Current Medications (01/25/2017):  This is the current hospital active medication list Current Facility-Administered Medications  Medication Dose Route Frequency Provider Last Rate Last Dose  . acetaminophen (TYLENOL) tablet 1,000 mg  1,000 mg Oral Q8H [name], MD   1,000 mg at 01/25/17 0532  . aspirin EC tablet 81 mg  81 mg Oral Daily Noralee Stain Chahn-Yang, DO   81 mg at 01/24/17 1019  . atorvastatin (LIPITOR) tablet 80 mg  80 mg Oral Daily Noralee Stain Chahn-Yang, DO   80 mg at 01/24/17 1019  . cefTRIAXone (ROCEPHIN) 2 g in dextrose 5 % 50 mL IVPB  2 g Intravenous Q24H Noralee Stain Chahn-Yang, DO   Stopped at 01/24/17 1145  . furosemide (LASIX) injection 80 mg  80 mg Intravenous BID Noralee Stain Chahn-Yang, DO   80 mg at 01/25/17 0755  . gabapentin (NEURONTIN) capsule 600 mg  600 mg Oral BID Noralee Stain Chahn-Yang, DO   600 mg at 01/24/17 2219  . hydrocerin (EUCERIN) cream   Topical BID [name], MD      . insulin aspart (novoLOG) injection 0-20 Units  0-20 Units Subcutaneous TID WC Noralee Stain Chahn-Yang, DO   20 Units at 01/24/17 1721  . insulin aspart (novoLOG) injection 0-5 Units  0-5 Units Subcutaneous QHS Noralee Stain [place], DO   5 Units at 01/24/17 2219  . insulin aspart (novoLOG) injection 6 Units  6 Units Subcutaneous TID WC Noralee Stain Chahn-Yang, DO   6 Units at 01/25/17 9233  . insulin glargine (LANTUS) injection 85 Units  85 Units Subcutaneous QHS [name], MD       And  . insulin glargine  (LANTUS) injection 80 Units  80 Units Subcutaneous Daily [name], MD      . metoprolol tartrate (LOPRESSOR) tablet 50 mg  50 mg Oral BID Noralee Stain Chahn-Yang, DO   50 mg at 01/24/17 2218  . oxyCODONE (Oxy IR/ROXICODONE) immediate release tablet 30 mg  30 mg Oral Q4H PRN [name], MD   30 mg at 01/25/17 0755  . predniSONE (DELTASONE) tablet 30 mg  30 mg Oral Q breakfast [name], MD   30 mg at 01/25/17 0755  . rivaroxaban (XARELTO) tablet 20 mg  20 mg Oral Q supper Noralee Stain Chahn-Yang, DO   20 mg at 01/24/17 1716  . sodium chloride flush (NS) 0.9 % injection 3 mL  3 mL Intravenous Q12H Noralee Stain Chahn-Yang, DO   3 mL at 01/24/17 2219  . spironolactone (ALDACTONE) tablet 25 mg  25 mg Oral Daily Noralee Stain Chahn-Yang, DO  25 mg at 01/24/17 1020     Discharge Medications: Please see discharge summary for a list of discharge medications.  Relevant Imaging Results:  Relevant Lab Results:   Additional Information SS#: [id number]  Antionette Poles, LCSW

## 2017-01-25 NOTE — Clinical Social Work Note (Signed)
Clinical Social Work Assessment  Patient Details  Name: [name] MRN: [id number] Date of Birth: 1951/05/07  Date of referral:  01/25/17               Reason for consult:  Facility Placement                Permission sought to share information with:  Facility Industrial/product designer granted to share information::  Yes, Verbal Permission Granted  Name::     [name]  Agency::     Relationship::  Sister  Contact Information:  [phone]  Housing/Transportation Living arrangements for the past 2 months:  Single Family Home Source of Information:  Patient Patient Interpreter Needed:  None Criminal Activity/Legal Involvement Pertinent to Current Situation/Hospitalization:  No - Comment as needed Significant Relationships:  Siblings Lives with:  Siblings Do you feel safe going back to the place where you live?  Yes Need for family participation in patient care:  No (Coment)  Care giving concerns:  Patient from home, resides with sister. PT recommended SNF for ST rehab.   Social Worker assessment / plan:  CSW spoke with patient at bedside regarding PT recommendation for SNF. Pt agreeable, reports he has been to [facility] in the past and would like to return. Patient requested PTAR for transportation. CSW completed FL2 and contacted [facility] about patient's request. CSW will follow and assist patient with discharge planning.   Employment status:  Disabled (Comment on whether or not currently receiving Disability) Insurance information:  Managed Medicare PT Recommendations:  Skilled Nursing Facility Information / Referral to community resources:  Skilled Nursing Facility  Patient/Family's Response to care:  Patient agreeable to SNF for ST rehab.  Patient/Family's Understanding of and Emotional Response to Diagnosis, Current Treatment, and Prognosis:  Patient verbalized understanding of plan to dc to SNF. Patient reported that the goal is to  return home after ST rehab and regain independence.   Emotional Assessment Appearance:  Appears older than stated age Attitude/Demeanor/Rapport:  Other (Cooperative) Affect (typically observed):  Calm Orientation:  Oriented to Self, Oriented to Place, Oriented to  Time, Oriented to Situation Alcohol / Substance use:  Not Applicable Psych involvement (Current and /or in the community):  No (Comment)  Discharge Needs  Concerns to be addressed:  No discharge needs identified Readmission within the last 30 days:  No Current discharge risk:  None Barriers to Discharge:  No Barriers Identified   Antionette Poles, LCSW 01/25/2017, 9:22 AM

## 2017-01-25 NOTE — Discharge Summary (Signed)
Physician Discharge Summary  [name] GNO:[id number] DOB: August 01, 1950 DOA: 01/22/2017  PCP: [name], MD  Admit date: 01/22/2017 Discharge date: 01/25/2017  Time spent: 35 minutes  Recommendations for Outpatient Follow-up:  Repeat CBC to follow Hgb trend in 5 days Antibiotics to be given for 5 more days to complete therapy Apply eucerin three times a day Repeat BMET in 5 days to follow electrolytes and renal function    Discharge Diagnoses:  Principal Problem:   Acute on chronic diastolic CHF (congestive heart failure) (HCC) Active Problems:   Bilateral lower leg cellulitis   Osteoarthritis of both knees   Diabetic nephropathy associated with type 2 diabetes mellitus ([place])   Discharge Condition: stable and improved. Discharge to SNF for further care and rehabilitation.  Diet recommendation: heart healthy diet and modified carbohydrates   Filed Weights   01/22/17 0846 01/23/17 0500 01/24/17 0542  Weight: (!) 164.4 kg (362 lb 7 oz) (!) 162.3 kg (357 lb 12.9 oz) (!) 162 kg (357 lb 2.3 oz)    History of present illness:  66 y.o.malewith medical history significant of CAD, chronic atrial fibrillation, hyperlipidemia, hypertension, chronic respiratory failure and uses 2 L as needed at home, chronic diastolic heart failure, morbid obesity who presents with progressively worsening bilateral leg swelling, pain, increased redness, as well as shortness of breath. He states that his legs have gotten recurrent cellulitis when he gets fluid overloaded, and he picks at the blisters that form on his legs. He denies any recent fevers or chills, no chest pain. No nausea, vomiting or diarrhea or abdominal pain. He does state that he sleeps on a recliner at baseline, and sometimes uses his home oxygen as needed. He did not take his diuretic at home yesterday or today, but does state that he has been urinating adequately.  Hospital Course:  1-acute on chronic diastolic heart  failure -2-D echo demonstrating diastolic heart failure with preserved ejection fraction and no wall motion abnormalities. -Will continue Aldactone and has been advise to be compliant with daily weight and low sodium diet  -Patient at home uses torsemide twice a day and metolazone twice a week; which would be resumed at discharge -Reported some diet indiscretion and have not take his diuretic for the last 2 days prior to admission.  2-sepsis secondary to bilateral lower strandy cellulitis -will treat with keflex and complete 5 more days at discharge -Wound care service has been consulted; appreciate inputs and will follow their recommendations (essentially keep scabs wounds clean and avoid picking/scratching, also using eucerin TID)  3-chronic kidney disease stage III -Renal function remains at baseline -Will recommend BMET to follow renal function trend   4-chronic anemia -Appears to be anemia of chronic kidney disease -No signs of acute bleeding -Hemoglobin stable (baseline 9-10) -No need for transfusion currently; will recommend CBC as an outpatient to continue monitoring hemoglobin trend.   5-atrial fibrillation: Chronic -CHADsVASC score 4 -Continue Xarelto -Will monitor on telemetry -Rate controlled  6-coronary artery disease -No complaining of chest pain -Will continue aspirin -Echo reassuring and unchanged, in comparison to prior study.  7-hyperlipidemia -Continue statins  8-diabetes mellitus type 2 uncontrolled with hyperglycemia and nephropathy -Recent A1c 8.2 -Continue sliding scale insulin with RU 500 and also adjusted dose of lantus BID -advise to follow modified carbohydrates diet   9-hypertension -Will continue current antihypertensive regimen. -Continue Heart healthy diet  10-chronic respiratory failure/obstructive sleep apnea -Patient intolerant to use CPAP, essentially sleeps on a recliner -Continue 2 L nasal cannula  oxygen supplementation as he  uses at home.  11-morbidly obesity -Body mass index is 48.44 kg/m. -Low calorie diet has been discussed with patient  12-physical deconditioning and bilateral knee pain.  -excruciating pain that is preventing him to walk stand or bend knees bilaterally.  -Physical exam decreased range of motion and very little swelling appreciated of his joints. -X-ray does demonstrate moderate Osteoarthritis with chondrocalcinosis  -Will continue empirical management with the use of prednisone 30 mg daily for 5 days  -Physical therapy has evaluated patient and recommended nursing home for rehabilitation. -Patient have outpatient follow-up with orthopedic service to further assess workup and future treatment for his condition.    Procedures:  See below for x-ray reports   Left knee and right knee x-ray does: Demonstrating moderate to severe chondrocalcinosis, with trace of suprapatellar effusion  2-D echo - Left ventricle: The cavity size was normal. There was mild concentric hypertrophy. Systolic function was normal. The estimated ejection fraction was in the range of 60% to 65%. Wall motion was normal; there were no regional wall motion abnormalities. The study is not technically sufficient to allow evaluation of LV diastolic function. - Aortic valve: Trileaflet; mildly thickened, mildly calcified leaflets. - Mitral valve: Calcified annulus.  Impressions: - Compared to the prior study, there has been no significant interval change.  Consultations:  None   Discharge Exam: Vitals:   01/24/17 2030 01/25/17 0556  BP: (!) 124/58 125/67  Pulse: 86 85  Resp: 20 20  Temp: (!) 97.5 F (36.4 C) 97.7 F (36.5 C)    General: Afebrile, denies chest pain and nausea/vomiting. Patient continued to express pain on his knees bilaterally (even improved after use of prednisone). Reports that his breathing is improved/back to baseline; continue to use oxygen supplementation  intermittently as he feels like it (same way that he uses at home, mainly at bedtime).  Cardiovascular: S1 and S2, no rubs, no gallops, no frank JVD appreciated on exam today.   Respiratory: Good oxygen saturation on room air during this visit, patient without tachypnea, normal respiratory effort and no using accessory muscles. No wheezing, no crackles appreciated on exam.  Abdomen: Obese, soft, nontender, nondistended, positive bowel sounds  Musculoskeletal: 1+ edema bilaterally. There is no cyanosis or clubbing. Multiple scabbed appreciated especially on his right lower extremity where he had a wound on one of this Has Been Opening Now with Mild Serosanguineous Drainage.    Discharge Instructions   Discharge Instructions    (HEART FAILURE PATIENTS) Call MD:  Anytime you have any of the following symptoms: 1) 3 pound weight gain in 24 hours or 5 pounds in 1 week 2) shortness of breath, with or without a dry hacking cough 3) swelling in the hands, feet or stomach 4) if you have to sleep on extra pillows at night in order to breathe.    Complete by:  As directed    Diet - low sodium heart healthy    Complete by:  As directed    Discharge instructions    Complete by:  As directed    Follow up with PCP in 2 weeks Take medications as prescribed  Make sure patient follow low sodium diet and modified carbohydrates  Antibiotics to be given for 5 more days to complete therapy Apply eucerin three times a day Repeat BMET in 5 days to follow electrolytes and renal function  Assure adequate hydration.     Current Discharge Medication List    START taking these medications  Details  cephALEXin (KEFLEX) 500 MG capsule Take 1 capsule (500 mg total) by mouth every 8 (eight) hours.    docusate sodium (COLACE) 100 MG capsule Take 1 capsule (100 mg total) by mouth daily.    polyethylene glycol (MIRALAX / GLYCOLAX) packet Take 17 g by mouth daily. Qty: 14 each, Refills: 0    predniSONE  (DELTASONE) 10 MG tablet Take 3 tablets (30 mg total) by mouth daily with breakfast.      CONTINUE these medications which have CHANGED   Details  insulin glargine (LANTUS) 100 UNIT/ML injection Inject 0.7 mLs (70 Units total) into the skin 2 (two) times daily.    oxyCODONE (ROXICODONE) 30 MG immediate release tablet Take 1 tablet (30 mg total) by mouth every 4 (four) hours as needed for severe pain. Qty: 25 tablet, Refills: 0      CONTINUE these medications which have NOT CHANGED   Details  ACCU-CHEK AVIVA PLUS test strip USE AS INSTRUCTED TO TEST BLOOD SUGAR 6 TIMES DAILY BEFORE MEALS. DX CODE- E11.65 Qty: 600 each, Refills: 2    aspirin EC 81 MG EC tablet Take 1 tablet (81 mg total) by mouth daily. Qty: 30 tablet, Refills: 2    atorvastatin (LIPITOR) 80 MG tablet Take 1 tablet (80 mg total) by mouth daily. Qty: 30 tablet, Refills: 10    BD INSULIN SYRINGE ULTRAFINE 31G X 15/64" 0.3 ML MISC USE TO IJNECT INSULIN 3 TIMES DAILY. Qty: 100 each, Refills: 5    Blood Glucose Monitoring Suppl (ACCU-CHEK AVIVA PLUS) w/Device KIT USE TO CHECK BLOOD SUGAR 6 TIMES PER DAY DX CODE E11.65 Qty: 1 kit, Refills: 1    gabapentin (NEURONTIN) 300 MG capsule Take 2 capsules (600 mg total) by mouth 2 (two) times daily. Qty: 60 capsule, Refills: 3    HUMULIN R 500 UNIT/ML injection INJECT 0.56 MILLILITERS INTO V-GO PUMP DAILY, DX CODE E11.65 Qty: 20 mL, Refills: 0    hydrocerin (EUCERIN) CREA Apply 1 application topically 3 (three) times daily. legs Qty: 454 g, Refills: 3    Hypromellose (NATURAL BALANCE TEARS OP) Place 1 drop into both eyes daily as needed (for dry eyes).     Insulin Pen Needle 31G X 5 MM MISC Use to inject insulin 4 times daily Qty: 130 each, Refills: 4    KLOR-CON M20 20 MEQ tablet TAKE 2 TABLETS BY MOUTH TWICE A DAY Qty: 30 tablet, Refills: 0    Lancets (ACCU-CHEK MULTICLIX) lancets Use to test blood sugar 6 times daily- Dx code- E11.65 Qty: 204 each, Refills: 5     metolazone (ZAROXOLYN) 5 MG tablet Take 32m twice weekly on Mondays and Fridays. Qty: 10 tablet, Refills: 11    metoprolol (LOPRESSOR) 50 MG tablet TAKE 1 TABLET (50 MG TOTAL) BY MOUTH 2 (TWO) TIMES DAILY. Qty: 180 tablet, Refills: 0    Multiple Vitamin (MULTIVITAMIN WITH MINERALS) TABS Take 1 tablet by mouth daily.    spironolactone (ALDACTONE) 25 MG tablet Take 25 mg by mouth daily.    torsemide (DEMADEX) 20 MG tablet Take 4 tablets (80 mg total) by mouth 2 (two) times daily. Qty: 240 tablet, Refills: 11    XARELTO 20 MG TABS tablet TAKE 1 TABLET BY MOUTH EVERY DAY WITH SUPPER Qty: 30 tablet, Refills: 3      STOP taking these medications     HUMALOG KWIKPEN 200 UNIT/ML SOPN        No Known Allergies  Contact information for follow-up providers    Langeland,  Leda Quail, MD. Schedule an appointment as soon as possible for a visit in 2 week(s).   Specialty:  Internal Medicine           Contact information for after-discharge care    [facility] Follow up.   Specialty:  [place] information: [address] [place] [phone]                  The results of significant diagnostics from this hospitalization (including imaging, microbiology, ancillary and laboratory) are listed below for reference.    Significant Diagnostic Studies: Dg Chest 2 View  Result Date: 01/22/2017 CLINICAL DATA:  Shortness of breath. Possible lower extremity cellulitis. EXAM: CHEST  2 VIEW COMPARISON:  06/09/2016. FINDINGS: Mildly progressive enlargement of the cardiac silhouette and prominence of the pulmonary vasculature. No pulmonary edema or pleural fluid seen. Thoracic spine degenerative changes. Aortic arch calcifications. IMPRESSION: Mildly progressive cardiomegaly with interval mild pulmonary vascular congestion. Electronically Signed   By: Claudie Revering M.D.   On: 01/22/2017 08:05   Dg Knee Complete 4  Views Left  Result Date: 01/22/2017 CLINICAL DATA:  Bilateral knee pain EXAM: LEFT KNEE - COMPLETE 4+ VIEW COMPARISON:  11/27/2014 FINDINGS: No acute fracture or malalignment. Mild to moderate patellofemoral degenerative changes with bony spurring. Trace suprapatellar effusion. Moderate degenerative changes of the lateral compartment and mild to moderate changes of the medial compartment. Chondrocalcinosis. IMPRESSION: 1. Mild to moderate arthritis of the left knee with trace suprapatellar effusion 2. Chondrocalcinosis Electronically Signed   By: Donavan Foil M.D.   On: 01/22/2017 03:59   Dg Knee Complete 4 Views Right  Result Date: 01/22/2017 CLINICAL DATA:  Bilateral knee pain EXAM: RIGHT KNEE - COMPLETE 4+ VIEW COMPARISON:  11/27/2014 FINDINGS: Chondrocalcinosis. No fracture or dislocation. Moderate to marked patellofemoral degenerative changes with sclerosis and osteophyte. Moderate degenerative changes of the medial compartment and mild to moderate degenerative changes of the lateral compartment. Vascular calcifications. IMPRESSION: 1. Arthritis of the right knee, most severe involving the patellofemoral compartment. No acute osseous abnormality. 2. Chondrocalcinosis Electronically Signed   By: Donavan Foil M.D.   On: 01/22/2017 04:01    Microbiology: No results found for this or any previous visit (from the past 240 hour(s)).   Labs: Basic Metabolic Panel:  Recent Labs Lab 01/22/17 0316 01/22/17 1212 01/22/17 1702 01/23/17 0515 01/24/17 0451 01/25/17 0907  NA 133* 132*  --  137 136 129*  K 3.8 4.1  --  3.3* 3.3* 3.2*  CL 91* 91*  --  93* 93* 86*  CO2 32 32  --  33* 33* 31  GLUCOSE 398* 536* 476* 228* 217* 518*  BUN 21* 21*  --  19 18 30*  CREATININE 1.34* 1.35*  --  1.30* 1.35* 1.41*  CALCIUM 8.0* 7.9*  --  8.3* 8.1* 8.0*   Liver Function Tests:  Recent Labs Lab 01/22/17 0316  AST 23  ALT 13*  ALKPHOS 66  BILITOT 1.3*  PROT 6.6  ALBUMIN 2.7*   CBC:  Recent  Labs Lab 01/22/17 0316 01/23/17 0515 01/25/17 0907  WBC 16.0* 17.1* 18.6*  NEUTROABS 12.3*  --  15.6*  HGB 8.4* 8.9* 9.1*  HCT 26.9* 28.7* 29.1*  MCV 73.1* 74.4* 74.2*  PLT 350 356 363   BNP (last 3 results)  Recent Labs  06/13/16 0324 06/14/16 0303 01/22/17 0509  BNP 137.5* 101.8* 183.2*   CBG:  Recent Labs Lab 01/24/17 1135  01/24/17 1639 01/24/17 2213 01/25/17 0727 01/25/17 1147  GLUCAP 259* 354* 398* 461* 373*    Signed:  Barton Dubois MD.  [name] 01/25/2017, 1:28 PM

## 2017-01-25 NOTE — Care Management Note (Signed)
Case Management Note  Patient Details  Name: GERMAIN KOOPMANN MRN: [id number] Date of Birth: 1951-07-09  Subjective/Objective:                    Action/Plan:d/c SNF.   Expected Discharge Date:  01/25/17               Expected Discharge Plan:  Skilled Nursing Facility  In-House Referral:  Clinical Social Work  Discharge planning Services  CM Consult  Post Acute Care Choice:    Choice offered to:     DME Arranged:    DME Agency:     HH Arranged:    HH Agency:     Status of Service:  Completed, signed off  If discussed at [employer], dates discussed:    Additional Comments:  Lanier Clam, RN 01/25/2017, 1:11 PM

## 2017-01-26 ENCOUNTER — Ambulatory Visit (INDEPENDENT_AMBULATORY_CARE_PROVIDER_SITE_OTHER): Payer: Medicare Other | Admitting: Family

## 2017-01-27 ENCOUNTER — Other Ambulatory Visit: Payer: Self-pay | Admitting: Pharmacist

## 2017-01-27 LAB — BASIC METABOLIC PANEL WITH GFR
Anion gap: 12 (ref 5–15)
BUN: 30 mg/dL — ABNORMAL HIGH (ref 6–20)
CO2: 31 mmol/L (ref 22–32)
Calcium: 8 mg/dL — ABNORMAL LOW (ref 8.9–10.3)
Chloride: 86 mmol/L — ABNORMAL LOW (ref 101–111)
Creatinine, Ser: 1.41 mg/dL — ABNORMAL HIGH (ref 0.61–1.24)
GFR calc Af Amer: 59 mL/min — ABNORMAL LOW
GFR calc non Af Amer: 51 mL/min — ABNORMAL LOW
Glucose, Bld: 518 mg/dL (ref 65–99)
Potassium: 3.2 mmol/L — ABNORMAL LOW (ref 3.5–5.1)
Sodium: 129 mmol/L — ABNORMAL LOW (ref 135–145)

## 2017-01-27 MED ORDER — METOPROLOL TARTRATE 50 MG PO TABS: 50.0000 mg | ORAL_TABLET | Freq: Two times a day (BID) | ORAL | 0 refills | Status: DC

## 2017-02-01 ENCOUNTER — Other Ambulatory Visit: Payer: Self-pay | Admitting: Endocrinology

## 2017-02-01 DIAGNOSIS — R5381 Other malaise: Secondary | ICD-10-CM | POA: Diagnosis not present

## 2017-02-01 DIAGNOSIS — M25511 Pain in right shoulder: Secondary | ICD-10-CM | POA: Diagnosis not present

## 2017-02-01 DIAGNOSIS — G894 Chronic pain syndrome: Secondary | ICD-10-CM | POA: Diagnosis not present

## 2017-02-01 DIAGNOSIS — M25569 Pain in unspecified knee: Secondary | ICD-10-CM | POA: Diagnosis not present

## 2017-02-01 DIAGNOSIS — R262 Difficulty in walking, not elsewhere classified: Secondary | ICD-10-CM | POA: Diagnosis not present

## 2017-02-01 DIAGNOSIS — I5030 Unspecified diastolic (congestive) heart failure: Secondary | ICD-10-CM | POA: Diagnosis not present

## 2017-02-01 DIAGNOSIS — I10 Essential (primary) hypertension: Secondary | ICD-10-CM | POA: Diagnosis not present

## 2017-02-01 DIAGNOSIS — M1711 Unilateral primary osteoarthritis, right knee: Secondary | ICD-10-CM | POA: Diagnosis not present

## 2017-02-02 ENCOUNTER — Ambulatory Visit: Payer: Medicare Other | Admitting: Podiatry

## 2017-02-04 ENCOUNTER — Other Ambulatory Visit: Payer: Self-pay | Admitting: Family Medicine

## 2017-02-04 ENCOUNTER — Other Ambulatory Visit: Payer: Self-pay | Admitting: Endocrinology

## 2017-02-04 ENCOUNTER — Ambulatory Visit: Payer: Medicare Other | Admitting: Endocrinology

## 2017-02-04 ENCOUNTER — Other Ambulatory Visit: Payer: Self-pay | Admitting: Cardiology

## 2017-02-04 NOTE — Telephone Encounter (Signed)
Rx has been sent to the pharmacy electronically. ° °

## 2017-02-07 DIAGNOSIS — M6281 Muscle weakness (generalized): Secondary | ICD-10-CM | POA: Diagnosis not present

## 2017-02-07 DIAGNOSIS — G4733 Obstructive sleep apnea (adult) (pediatric): Secondary | ICD-10-CM | POA: Diagnosis not present

## 2017-02-07 DIAGNOSIS — L0390 Cellulitis, unspecified: Secondary | ICD-10-CM | POA: Diagnosis not present

## 2017-02-07 DIAGNOSIS — E1165 Type 2 diabetes mellitus with hyperglycemia: Secondary | ICD-10-CM | POA: Diagnosis not present

## 2017-02-07 DIAGNOSIS — I5030 Unspecified diastolic (congestive) heart failure: Secondary | ICD-10-CM | POA: Diagnosis not present

## 2017-02-08 DIAGNOSIS — M25569 Pain in unspecified knee: Secondary | ICD-10-CM | POA: Diagnosis not present

## 2017-02-08 DIAGNOSIS — R5381 Other malaise: Secondary | ICD-10-CM | POA: Diagnosis not present

## 2017-02-08 DIAGNOSIS — R262 Difficulty in walking, not elsewhere classified: Secondary | ICD-10-CM | POA: Diagnosis not present

## 2017-02-08 DIAGNOSIS — M25511 Pain in right shoulder: Secondary | ICD-10-CM | POA: Diagnosis not present

## 2017-02-13 ENCOUNTER — Other Ambulatory Visit: Payer: Self-pay | Admitting: Family Medicine

## 2017-02-14 DIAGNOSIS — L97419 Non-pressure chronic ulcer of right heel and midfoot with unspecified severity: Secondary | ICD-10-CM | POA: Diagnosis not present

## 2017-02-17 DIAGNOSIS — R262 Difficulty in walking, not elsewhere classified: Secondary | ICD-10-CM | POA: Diagnosis not present

## 2017-02-17 DIAGNOSIS — R5381 Other malaise: Secondary | ICD-10-CM | POA: Diagnosis not present

## 2017-02-17 DIAGNOSIS — M25569 Pain in unspecified knee: Secondary | ICD-10-CM | POA: Diagnosis not present

## 2017-02-17 DIAGNOSIS — L97419 Non-pressure chronic ulcer of right heel and midfoot with unspecified severity: Secondary | ICD-10-CM | POA: Diagnosis not present

## 2017-02-17 DIAGNOSIS — M25511 Pain in right shoulder: Secondary | ICD-10-CM | POA: Diagnosis not present

## 2017-02-19 DIAGNOSIS — I130 Hypertensive heart and chronic kidney disease with heart failure and stage 1 through stage 4 chronic kidney disease, or unspecified chronic kidney disease: Secondary | ICD-10-CM | POA: Diagnosis not present

## 2017-02-19 DIAGNOSIS — E11621 Type 2 diabetes mellitus with foot ulcer: Secondary | ICD-10-CM | POA: Diagnosis not present

## 2017-02-19 DIAGNOSIS — I482 Chronic atrial fibrillation: Secondary | ICD-10-CM | POA: Diagnosis not present

## 2017-02-19 DIAGNOSIS — I2510 Atherosclerotic heart disease of native coronary artery without angina pectoris: Secondary | ICD-10-CM | POA: Diagnosis not present

## 2017-02-19 DIAGNOSIS — D631 Anemia in chronic kidney disease: Secondary | ICD-10-CM | POA: Diagnosis not present

## 2017-02-19 DIAGNOSIS — I5033 Acute on chronic diastolic (congestive) heart failure: Secondary | ICD-10-CM | POA: Diagnosis not present

## 2017-02-19 DIAGNOSIS — E1165 Type 2 diabetes mellitus with hyperglycemia: Secondary | ICD-10-CM | POA: Diagnosis not present

## 2017-02-19 DIAGNOSIS — J9610 Chronic respiratory failure, unspecified whether with hypoxia or hypercapnia: Secondary | ICD-10-CM | POA: Diagnosis not present

## 2017-02-19 DIAGNOSIS — S31109D Unspecified open wound of abdominal wall, unspecified quadrant without penetration into peritoneal cavity, subsequent encounter: Secondary | ICD-10-CM | POA: Diagnosis not present

## 2017-02-19 DIAGNOSIS — M170 Bilateral primary osteoarthritis of knee: Secondary | ICD-10-CM | POA: Diagnosis not present

## 2017-02-19 DIAGNOSIS — N183 Chronic kidney disease, stage 3 (moderate): Secondary | ICD-10-CM | POA: Diagnosis not present

## 2017-02-19 DIAGNOSIS — E1122 Type 2 diabetes mellitus with diabetic chronic kidney disease: Secondary | ICD-10-CM | POA: Diagnosis not present

## 2017-02-19 DIAGNOSIS — L97411 Non-pressure chronic ulcer of right heel and midfoot limited to breakdown of skin: Secondary | ICD-10-CM | POA: Diagnosis not present

## 2017-02-22 ENCOUNTER — Telehealth: Payer: Self-pay | Admitting: Internal Medicine

## 2017-02-22 DIAGNOSIS — E1165 Type 2 diabetes mellitus with hyperglycemia: Secondary | ICD-10-CM | POA: Diagnosis not present

## 2017-02-22 DIAGNOSIS — L97411 Non-pressure chronic ulcer of right heel and midfoot limited to breakdown of skin: Secondary | ICD-10-CM | POA: Diagnosis not present

## 2017-02-22 DIAGNOSIS — I130 Hypertensive heart and chronic kidney disease with heart failure and stage 1 through stage 4 chronic kidney disease, or unspecified chronic kidney disease: Secondary | ICD-10-CM | POA: Diagnosis not present

## 2017-02-22 DIAGNOSIS — I2510 Atherosclerotic heart disease of native coronary artery without angina pectoris: Secondary | ICD-10-CM | POA: Diagnosis not present

## 2017-02-22 DIAGNOSIS — M170 Bilateral primary osteoarthritis of knee: Secondary | ICD-10-CM | POA: Diagnosis not present

## 2017-02-22 DIAGNOSIS — S31109D Unspecified open wound of abdominal wall, unspecified quadrant without penetration into peritoneal cavity, subsequent encounter: Secondary | ICD-10-CM | POA: Diagnosis not present

## 2017-02-22 DIAGNOSIS — E1122 Type 2 diabetes mellitus with diabetic chronic kidney disease: Secondary | ICD-10-CM | POA: Diagnosis not present

## 2017-02-22 DIAGNOSIS — N183 Chronic kidney disease, stage 3 (moderate): Secondary | ICD-10-CM | POA: Diagnosis not present

## 2017-02-22 DIAGNOSIS — J9610 Chronic respiratory failure, unspecified whether with hypoxia or hypercapnia: Secondary | ICD-10-CM | POA: Diagnosis not present

## 2017-02-22 DIAGNOSIS — I5033 Acute on chronic diastolic (congestive) heart failure: Secondary | ICD-10-CM | POA: Diagnosis not present

## 2017-02-22 DIAGNOSIS — D631 Anemia in chronic kidney disease: Secondary | ICD-10-CM | POA: Diagnosis not present

## 2017-02-22 DIAGNOSIS — E11621 Type 2 diabetes mellitus with foot ulcer: Secondary | ICD-10-CM | POA: Diagnosis not present

## 2017-02-22 DIAGNOSIS — I482 Chronic atrial fibrillation: Secondary | ICD-10-CM | POA: Diagnosis not present

## 2017-02-22 NOTE — Telephone Encounter (Signed)
[name] from [facility] called requesting wound care orders, management for DM, and Social Worker orders. Please f/u

## 2017-02-22 NOTE — Telephone Encounter (Signed)
Called to [facility] Left message for verbal orders for management of diabetes, wound care and social work provided.

## 2017-02-23 DIAGNOSIS — G4733 Obstructive sleep apnea (adult) (pediatric): Secondary | ICD-10-CM | POA: Diagnosis not present

## 2017-02-24 DIAGNOSIS — E1165 Type 2 diabetes mellitus with hyperglycemia: Secondary | ICD-10-CM | POA: Diagnosis not present

## 2017-02-24 DIAGNOSIS — E11621 Type 2 diabetes mellitus with foot ulcer: Secondary | ICD-10-CM | POA: Diagnosis not present

## 2017-02-24 DIAGNOSIS — E1122 Type 2 diabetes mellitus with diabetic chronic kidney disease: Secondary | ICD-10-CM | POA: Diagnosis not present

## 2017-02-24 DIAGNOSIS — M170 Bilateral primary osteoarthritis of knee: Secondary | ICD-10-CM | POA: Diagnosis not present

## 2017-02-24 DIAGNOSIS — I5033 Acute on chronic diastolic (congestive) heart failure: Secondary | ICD-10-CM | POA: Diagnosis not present

## 2017-02-24 DIAGNOSIS — L97411 Non-pressure chronic ulcer of right heel and midfoot limited to breakdown of skin: Secondary | ICD-10-CM | POA: Diagnosis not present

## 2017-02-24 DIAGNOSIS — S31109D Unspecified open wound of abdominal wall, unspecified quadrant without penetration into peritoneal cavity, subsequent encounter: Secondary | ICD-10-CM | POA: Diagnosis not present

## 2017-02-24 DIAGNOSIS — D631 Anemia in chronic kidney disease: Secondary | ICD-10-CM | POA: Diagnosis not present

## 2017-02-24 DIAGNOSIS — N183 Chronic kidney disease, stage 3 (moderate): Secondary | ICD-10-CM | POA: Diagnosis not present

## 2017-02-24 DIAGNOSIS — J9610 Chronic respiratory failure, unspecified whether with hypoxia or hypercapnia: Secondary | ICD-10-CM | POA: Diagnosis not present

## 2017-02-24 DIAGNOSIS — I130 Hypertensive heart and chronic kidney disease with heart failure and stage 1 through stage 4 chronic kidney disease, or unspecified chronic kidney disease: Secondary | ICD-10-CM | POA: Diagnosis not present

## 2017-02-24 DIAGNOSIS — I482 Chronic atrial fibrillation: Secondary | ICD-10-CM | POA: Diagnosis not present

## 2017-02-24 DIAGNOSIS — I2510 Atherosclerotic heart disease of native coronary artery without angina pectoris: Secondary | ICD-10-CM | POA: Diagnosis not present

## 2017-02-25 DIAGNOSIS — Z139 Encounter for screening, unspecified: Secondary | ICD-10-CM | POA: Diagnosis not present

## 2017-02-26 DIAGNOSIS — D631 Anemia in chronic kidney disease: Secondary | ICD-10-CM | POA: Diagnosis not present

## 2017-02-26 DIAGNOSIS — I5033 Acute on chronic diastolic (congestive) heart failure: Secondary | ICD-10-CM | POA: Diagnosis not present

## 2017-02-26 DIAGNOSIS — M170 Bilateral primary osteoarthritis of knee: Secondary | ICD-10-CM | POA: Diagnosis not present

## 2017-02-26 DIAGNOSIS — J9610 Chronic respiratory failure, unspecified whether with hypoxia or hypercapnia: Secondary | ICD-10-CM | POA: Diagnosis not present

## 2017-02-26 DIAGNOSIS — S31109D Unspecified open wound of abdominal wall, unspecified quadrant without penetration into peritoneal cavity, subsequent encounter: Secondary | ICD-10-CM | POA: Diagnosis not present

## 2017-02-26 DIAGNOSIS — I130 Hypertensive heart and chronic kidney disease with heart failure and stage 1 through stage 4 chronic kidney disease, or unspecified chronic kidney disease: Secondary | ICD-10-CM | POA: Diagnosis not present

## 2017-02-26 DIAGNOSIS — I482 Chronic atrial fibrillation: Secondary | ICD-10-CM | POA: Diagnosis not present

## 2017-02-26 DIAGNOSIS — I2510 Atherosclerotic heart disease of native coronary artery without angina pectoris: Secondary | ICD-10-CM | POA: Diagnosis not present

## 2017-02-26 DIAGNOSIS — N183 Chronic kidney disease, stage 3 (moderate): Secondary | ICD-10-CM | POA: Diagnosis not present

## 2017-02-26 DIAGNOSIS — E1165 Type 2 diabetes mellitus with hyperglycemia: Secondary | ICD-10-CM | POA: Diagnosis not present

## 2017-02-26 DIAGNOSIS — E11621 Type 2 diabetes mellitus with foot ulcer: Secondary | ICD-10-CM | POA: Diagnosis not present

## 2017-02-26 DIAGNOSIS — E1122 Type 2 diabetes mellitus with diabetic chronic kidney disease: Secondary | ICD-10-CM | POA: Diagnosis not present

## 2017-02-26 DIAGNOSIS — L97411 Non-pressure chronic ulcer of right heel and midfoot limited to breakdown of skin: Secondary | ICD-10-CM | POA: Diagnosis not present

## 2017-02-28 ENCOUNTER — Ambulatory Visit: Payer: Medicare Other | Admitting: Internal Medicine

## 2017-03-02 DIAGNOSIS — Z79891 Long term (current) use of opiate analgesic: Secondary | ICD-10-CM | POA: Diagnosis not present

## 2017-03-02 DIAGNOSIS — G894 Chronic pain syndrome: Secondary | ICD-10-CM | POA: Diagnosis not present

## 2017-03-02 DIAGNOSIS — M4726 Other spondylosis with radiculopathy, lumbar region: Secondary | ICD-10-CM | POA: Diagnosis not present

## 2017-03-02 DIAGNOSIS — E1142 Type 2 diabetes mellitus with diabetic polyneuropathy: Secondary | ICD-10-CM | POA: Diagnosis not present

## 2017-03-03 DIAGNOSIS — I5033 Acute on chronic diastolic (congestive) heart failure: Secondary | ICD-10-CM | POA: Diagnosis not present

## 2017-03-03 DIAGNOSIS — J9610 Chronic respiratory failure, unspecified whether with hypoxia or hypercapnia: Secondary | ICD-10-CM | POA: Diagnosis not present

## 2017-03-03 DIAGNOSIS — E11621 Type 2 diabetes mellitus with foot ulcer: Secondary | ICD-10-CM | POA: Diagnosis not present

## 2017-03-03 DIAGNOSIS — N183 Chronic kidney disease, stage 3 (moderate): Secondary | ICD-10-CM | POA: Diagnosis not present

## 2017-03-03 DIAGNOSIS — I482 Chronic atrial fibrillation: Secondary | ICD-10-CM | POA: Diagnosis not present

## 2017-03-03 DIAGNOSIS — M170 Bilateral primary osteoarthritis of knee: Secondary | ICD-10-CM | POA: Diagnosis not present

## 2017-03-03 DIAGNOSIS — D631 Anemia in chronic kidney disease: Secondary | ICD-10-CM | POA: Diagnosis not present

## 2017-03-03 DIAGNOSIS — E1122 Type 2 diabetes mellitus with diabetic chronic kidney disease: Secondary | ICD-10-CM | POA: Diagnosis not present

## 2017-03-03 DIAGNOSIS — L97411 Non-pressure chronic ulcer of right heel and midfoot limited to breakdown of skin: Secondary | ICD-10-CM | POA: Diagnosis not present

## 2017-03-03 DIAGNOSIS — I130 Hypertensive heart and chronic kidney disease with heart failure and stage 1 through stage 4 chronic kidney disease, or unspecified chronic kidney disease: Secondary | ICD-10-CM | POA: Diagnosis not present

## 2017-03-03 DIAGNOSIS — I2510 Atherosclerotic heart disease of native coronary artery without angina pectoris: Secondary | ICD-10-CM | POA: Diagnosis not present

## 2017-03-03 DIAGNOSIS — S31109D Unspecified open wound of abdominal wall, unspecified quadrant without penetration into peritoneal cavity, subsequent encounter: Secondary | ICD-10-CM | POA: Diagnosis not present

## 2017-03-03 DIAGNOSIS — E1165 Type 2 diabetes mellitus with hyperglycemia: Secondary | ICD-10-CM | POA: Diagnosis not present

## 2017-03-04 ENCOUNTER — Other Ambulatory Visit: Payer: Self-pay | Admitting: Pharmacist

## 2017-03-04 DIAGNOSIS — L97411 Non-pressure chronic ulcer of right heel and midfoot limited to breakdown of skin: Secondary | ICD-10-CM | POA: Diagnosis not present

## 2017-03-04 DIAGNOSIS — I2510 Atherosclerotic heart disease of native coronary artery without angina pectoris: Secondary | ICD-10-CM | POA: Diagnosis not present

## 2017-03-04 DIAGNOSIS — E11621 Type 2 diabetes mellitus with foot ulcer: Secondary | ICD-10-CM | POA: Diagnosis not present

## 2017-03-04 DIAGNOSIS — E559 Vitamin D deficiency, unspecified: Secondary | ICD-10-CM | POA: Diagnosis not present

## 2017-03-04 DIAGNOSIS — I130 Hypertensive heart and chronic kidney disease with heart failure and stage 1 through stage 4 chronic kidney disease, or unspecified chronic kidney disease: Secondary | ICD-10-CM | POA: Diagnosis not present

## 2017-03-04 DIAGNOSIS — E1122 Type 2 diabetes mellitus with diabetic chronic kidney disease: Secondary | ICD-10-CM | POA: Diagnosis not present

## 2017-03-04 DIAGNOSIS — I5033 Acute on chronic diastolic (congestive) heart failure: Secondary | ICD-10-CM | POA: Diagnosis not present

## 2017-03-04 DIAGNOSIS — D631 Anemia in chronic kidney disease: Secondary | ICD-10-CM | POA: Diagnosis not present

## 2017-03-04 DIAGNOSIS — Z125 Encounter for screening for malignant neoplasm of prostate: Secondary | ICD-10-CM | POA: Diagnosis not present

## 2017-03-04 DIAGNOSIS — I482 Chronic atrial fibrillation: Secondary | ICD-10-CM | POA: Diagnosis not present

## 2017-03-04 DIAGNOSIS — E1165 Type 2 diabetes mellitus with hyperglycemia: Secondary | ICD-10-CM | POA: Diagnosis not present

## 2017-03-04 DIAGNOSIS — S31109D Unspecified open wound of abdominal wall, unspecified quadrant without penetration into peritoneal cavity, subsequent encounter: Secondary | ICD-10-CM | POA: Diagnosis not present

## 2017-03-04 DIAGNOSIS — M170 Bilateral primary osteoarthritis of knee: Secondary | ICD-10-CM | POA: Diagnosis not present

## 2017-03-04 DIAGNOSIS — N183 Chronic kidney disease, stage 3 (moderate): Secondary | ICD-10-CM | POA: Diagnosis not present

## 2017-03-04 DIAGNOSIS — J9610 Chronic respiratory failure, unspecified whether with hypoxia or hypercapnia: Secondary | ICD-10-CM | POA: Diagnosis not present

## 2017-03-04 MED ORDER — RIVAROXABAN 20 MG PO TABS: ORAL_TABLET | ORAL | 0 refills | Status: DC

## 2017-03-05 ENCOUNTER — Other Ambulatory Visit: Payer: Self-pay | Admitting: Internal Medicine

## 2017-03-05 ENCOUNTER — Other Ambulatory Visit: Payer: Self-pay | Admitting: Endocrinology

## 2017-03-08 DIAGNOSIS — D631 Anemia in chronic kidney disease: Secondary | ICD-10-CM | POA: Diagnosis not present

## 2017-03-08 DIAGNOSIS — E11621 Type 2 diabetes mellitus with foot ulcer: Secondary | ICD-10-CM | POA: Diagnosis not present

## 2017-03-08 DIAGNOSIS — N183 Chronic kidney disease, stage 3 (moderate): Secondary | ICD-10-CM | POA: Diagnosis not present

## 2017-03-08 DIAGNOSIS — I2510 Atherosclerotic heart disease of native coronary artery without angina pectoris: Secondary | ICD-10-CM | POA: Diagnosis not present

## 2017-03-08 DIAGNOSIS — I482 Chronic atrial fibrillation: Secondary | ICD-10-CM | POA: Diagnosis not present

## 2017-03-08 DIAGNOSIS — I5033 Acute on chronic diastolic (congestive) heart failure: Secondary | ICD-10-CM | POA: Diagnosis not present

## 2017-03-08 DIAGNOSIS — L97411 Non-pressure chronic ulcer of right heel and midfoot limited to breakdown of skin: Secondary | ICD-10-CM | POA: Diagnosis not present

## 2017-03-08 DIAGNOSIS — E1165 Type 2 diabetes mellitus with hyperglycemia: Secondary | ICD-10-CM | POA: Diagnosis not present

## 2017-03-08 DIAGNOSIS — M170 Bilateral primary osteoarthritis of knee: Secondary | ICD-10-CM | POA: Diagnosis not present

## 2017-03-08 DIAGNOSIS — E1122 Type 2 diabetes mellitus with diabetic chronic kidney disease: Secondary | ICD-10-CM | POA: Diagnosis not present

## 2017-03-08 DIAGNOSIS — J9610 Chronic respiratory failure, unspecified whether with hypoxia or hypercapnia: Secondary | ICD-10-CM | POA: Diagnosis not present

## 2017-03-08 DIAGNOSIS — I130 Hypertensive heart and chronic kidney disease with heart failure and stage 1 through stage 4 chronic kidney disease, or unspecified chronic kidney disease: Secondary | ICD-10-CM | POA: Diagnosis not present

## 2017-03-08 DIAGNOSIS — S31109D Unspecified open wound of abdominal wall, unspecified quadrant without penetration into peritoneal cavity, subsequent encounter: Secondary | ICD-10-CM | POA: Diagnosis not present

## 2017-03-09 DIAGNOSIS — I2510 Atherosclerotic heart disease of native coronary artery without angina pectoris: Secondary | ICD-10-CM | POA: Diagnosis not present

## 2017-03-09 DIAGNOSIS — L97411 Non-pressure chronic ulcer of right heel and midfoot limited to breakdown of skin: Secondary | ICD-10-CM | POA: Diagnosis not present

## 2017-03-09 DIAGNOSIS — E1165 Type 2 diabetes mellitus with hyperglycemia: Secondary | ICD-10-CM | POA: Diagnosis not present

## 2017-03-09 DIAGNOSIS — I5033 Acute on chronic diastolic (congestive) heart failure: Secondary | ICD-10-CM | POA: Diagnosis not present

## 2017-03-09 DIAGNOSIS — I482 Chronic atrial fibrillation: Secondary | ICD-10-CM | POA: Diagnosis not present

## 2017-03-09 DIAGNOSIS — D631 Anemia in chronic kidney disease: Secondary | ICD-10-CM | POA: Diagnosis not present

## 2017-03-09 DIAGNOSIS — N183 Chronic kidney disease, stage 3 (moderate): Secondary | ICD-10-CM | POA: Diagnosis not present

## 2017-03-09 DIAGNOSIS — S31109D Unspecified open wound of abdominal wall, unspecified quadrant without penetration into peritoneal cavity, subsequent encounter: Secondary | ICD-10-CM | POA: Diagnosis not present

## 2017-03-09 DIAGNOSIS — J9610 Chronic respiratory failure, unspecified whether with hypoxia or hypercapnia: Secondary | ICD-10-CM | POA: Diagnosis not present

## 2017-03-09 DIAGNOSIS — I130 Hypertensive heart and chronic kidney disease with heart failure and stage 1 through stage 4 chronic kidney disease, or unspecified chronic kidney disease: Secondary | ICD-10-CM | POA: Diagnosis not present

## 2017-03-09 DIAGNOSIS — M170 Bilateral primary osteoarthritis of knee: Secondary | ICD-10-CM | POA: Diagnosis not present

## 2017-03-09 DIAGNOSIS — E11621 Type 2 diabetes mellitus with foot ulcer: Secondary | ICD-10-CM | POA: Diagnosis not present

## 2017-03-09 DIAGNOSIS — E1122 Type 2 diabetes mellitus with diabetic chronic kidney disease: Secondary | ICD-10-CM | POA: Diagnosis not present

## 2017-03-10 DIAGNOSIS — J9610 Chronic respiratory failure, unspecified whether with hypoxia or hypercapnia: Secondary | ICD-10-CM | POA: Diagnosis not present

## 2017-03-10 DIAGNOSIS — S31109D Unspecified open wound of abdominal wall, unspecified quadrant without penetration into peritoneal cavity, subsequent encounter: Secondary | ICD-10-CM | POA: Diagnosis not present

## 2017-03-10 DIAGNOSIS — I2510 Atherosclerotic heart disease of native coronary artery without angina pectoris: Secondary | ICD-10-CM | POA: Diagnosis not present

## 2017-03-10 DIAGNOSIS — I5033 Acute on chronic diastolic (congestive) heart failure: Secondary | ICD-10-CM | POA: Diagnosis not present

## 2017-03-10 DIAGNOSIS — E1122 Type 2 diabetes mellitus with diabetic chronic kidney disease: Secondary | ICD-10-CM | POA: Diagnosis not present

## 2017-03-10 DIAGNOSIS — I482 Chronic atrial fibrillation: Secondary | ICD-10-CM | POA: Diagnosis not present

## 2017-03-10 DIAGNOSIS — M170 Bilateral primary osteoarthritis of knee: Secondary | ICD-10-CM | POA: Diagnosis not present

## 2017-03-10 DIAGNOSIS — N183 Chronic kidney disease, stage 3 (moderate): Secondary | ICD-10-CM | POA: Diagnosis not present

## 2017-03-10 DIAGNOSIS — D631 Anemia in chronic kidney disease: Secondary | ICD-10-CM | POA: Diagnosis not present

## 2017-03-10 DIAGNOSIS — E11621 Type 2 diabetes mellitus with foot ulcer: Secondary | ICD-10-CM | POA: Diagnosis not present

## 2017-03-10 DIAGNOSIS — L97411 Non-pressure chronic ulcer of right heel and midfoot limited to breakdown of skin: Secondary | ICD-10-CM | POA: Diagnosis not present

## 2017-03-10 DIAGNOSIS — E1165 Type 2 diabetes mellitus with hyperglycemia: Secondary | ICD-10-CM | POA: Diagnosis not present

## 2017-03-10 DIAGNOSIS — I130 Hypertensive heart and chronic kidney disease with heart failure and stage 1 through stage 4 chronic kidney disease, or unspecified chronic kidney disease: Secondary | ICD-10-CM | POA: Diagnosis not present

## 2017-03-11 DIAGNOSIS — S31109D Unspecified open wound of abdominal wall, unspecified quadrant without penetration into peritoneal cavity, subsequent encounter: Secondary | ICD-10-CM | POA: Diagnosis not present

## 2017-03-11 DIAGNOSIS — I130 Hypertensive heart and chronic kidney disease with heart failure and stage 1 through stage 4 chronic kidney disease, or unspecified chronic kidney disease: Secondary | ICD-10-CM | POA: Diagnosis not present

## 2017-03-11 DIAGNOSIS — M170 Bilateral primary osteoarthritis of knee: Secondary | ICD-10-CM | POA: Diagnosis not present

## 2017-03-11 DIAGNOSIS — N183 Chronic kidney disease, stage 3 (moderate): Secondary | ICD-10-CM | POA: Diagnosis not present

## 2017-03-11 DIAGNOSIS — E1165 Type 2 diabetes mellitus with hyperglycemia: Secondary | ICD-10-CM | POA: Diagnosis not present

## 2017-03-11 DIAGNOSIS — J9610 Chronic respiratory failure, unspecified whether with hypoxia or hypercapnia: Secondary | ICD-10-CM | POA: Diagnosis not present

## 2017-03-11 DIAGNOSIS — E1122 Type 2 diabetes mellitus with diabetic chronic kidney disease: Secondary | ICD-10-CM | POA: Diagnosis not present

## 2017-03-11 DIAGNOSIS — L97411 Non-pressure chronic ulcer of right heel and midfoot limited to breakdown of skin: Secondary | ICD-10-CM | POA: Diagnosis not present

## 2017-03-11 DIAGNOSIS — D631 Anemia in chronic kidney disease: Secondary | ICD-10-CM | POA: Diagnosis not present

## 2017-03-11 DIAGNOSIS — I482 Chronic atrial fibrillation: Secondary | ICD-10-CM | POA: Diagnosis not present

## 2017-03-11 DIAGNOSIS — E11621 Type 2 diabetes mellitus with foot ulcer: Secondary | ICD-10-CM | POA: Diagnosis not present

## 2017-03-11 DIAGNOSIS — I2510 Atherosclerotic heart disease of native coronary artery without angina pectoris: Secondary | ICD-10-CM | POA: Diagnosis not present

## 2017-03-11 DIAGNOSIS — I5033 Acute on chronic diastolic (congestive) heart failure: Secondary | ICD-10-CM | POA: Diagnosis not present

## 2017-03-12 DIAGNOSIS — E1110 Type 2 diabetes mellitus with ketoacidosis without coma: Secondary | ICD-10-CM

## 2017-03-12 HISTORY — DX: Type 2 diabetes mellitus with ketoacidosis without coma: E11.10

## 2017-03-14 ENCOUNTER — Inpatient Hospital Stay (HOSPITAL_COMMUNITY)
Admission: EM | Admit: 2017-03-14 | Discharge: 2017-03-18 | DRG: 871 | Disposition: A | Discharge status: Skilled Nursing Facility | Payer: Medicare Other | Attending: Internal Medicine | Admitting: Internal Medicine

## 2017-03-14 ENCOUNTER — Inpatient Hospital Stay (HOSPITAL_COMMUNITY): Payer: Medicare Other

## 2017-03-14 ENCOUNTER — Encounter (HOSPITAL_COMMUNITY): Payer: Self-pay | Admitting: Emergency Medicine

## 2017-03-14 ENCOUNTER — Emergency Department (HOSPITAL_COMMUNITY): Payer: Medicare Other

## 2017-03-14 DIAGNOSIS — N183 Chronic kidney disease, stage 3 unspecified: Secondary | ICD-10-CM | POA: Diagnosis present

## 2017-03-14 DIAGNOSIS — I471 Supraventricular tachycardia: Secondary | ICD-10-CM | POA: Diagnosis present

## 2017-03-14 DIAGNOSIS — E1152 Type 2 diabetes mellitus with diabetic peripheral angiopathy with gangrene: Secondary | ICD-10-CM | POA: Diagnosis not present

## 2017-03-14 DIAGNOSIS — Z794 Long term (current) use of insulin: Secondary | ICD-10-CM

## 2017-03-14 DIAGNOSIS — M86171 Other acute osteomyelitis, right ankle and foot: Secondary | ICD-10-CM | POA: Diagnosis not present

## 2017-03-14 DIAGNOSIS — M170 Bilateral primary osteoarthritis of knee: Secondary | ICD-10-CM | POA: Diagnosis not present

## 2017-03-14 DIAGNOSIS — M86172 Other acute osteomyelitis, left ankle and foot: Secondary | ICD-10-CM | POA: Diagnosis not present

## 2017-03-14 DIAGNOSIS — M86671 Other chronic osteomyelitis, right ankle and foot: Secondary | ICD-10-CM | POA: Diagnosis not present

## 2017-03-14 DIAGNOSIS — I878 Other specified disorders of veins: Secondary | ICD-10-CM | POA: Diagnosis not present

## 2017-03-14 DIAGNOSIS — L97411 Non-pressure chronic ulcer of right heel and midfoot limited to breakdown of skin: Secondary | ICD-10-CM | POA: Diagnosis not present

## 2017-03-14 DIAGNOSIS — E08621 Diabetes mellitus due to underlying condition with foot ulcer: Secondary | ICD-10-CM | POA: Diagnosis not present

## 2017-03-14 DIAGNOSIS — E785 Hyperlipidemia, unspecified: Secondary | ICD-10-CM | POA: Diagnosis present

## 2017-03-14 DIAGNOSIS — I130 Hypertensive heart and chronic kidney disease with heart failure and stage 1 through stage 4 chronic kidney disease, or unspecified chronic kidney disease: Secondary | ICD-10-CM | POA: Diagnosis not present

## 2017-03-14 DIAGNOSIS — I10 Essential (primary) hypertension: Secondary | ICD-10-CM | POA: Diagnosis not present

## 2017-03-14 DIAGNOSIS — R0602 Shortness of breath: Secondary | ICD-10-CM | POA: Diagnosis not present

## 2017-03-14 DIAGNOSIS — L02611 Cutaneous abscess of right foot: Secondary | ICD-10-CM | POA: Diagnosis present

## 2017-03-14 DIAGNOSIS — I509 Heart failure, unspecified: Secondary | ICD-10-CM | POA: Diagnosis not present

## 2017-03-14 DIAGNOSIS — E860 Dehydration: Secondary | ICD-10-CM | POA: Diagnosis present

## 2017-03-14 DIAGNOSIS — I480 Paroxysmal atrial fibrillation: Secondary | ICD-10-CM | POA: Diagnosis not present

## 2017-03-14 DIAGNOSIS — Z9981 Dependence on supplemental oxygen: Secondary | ICD-10-CM

## 2017-03-14 DIAGNOSIS — N179 Acute kidney failure, unspecified: Secondary | ICD-10-CM | POA: Diagnosis not present

## 2017-03-14 DIAGNOSIS — L03115 Cellulitis of right lower limb: Secondary | ICD-10-CM | POA: Diagnosis not present

## 2017-03-14 DIAGNOSIS — G4733 Obstructive sleep apnea (adult) (pediatric): Secondary | ICD-10-CM | POA: Diagnosis not present

## 2017-03-14 DIAGNOSIS — L03116 Cellulitis of left lower limb: Secondary | ICD-10-CM | POA: Diagnosis present

## 2017-03-14 DIAGNOSIS — Z7901 Long term (current) use of anticoagulants: Secondary | ICD-10-CM

## 2017-03-14 DIAGNOSIS — R748 Abnormal levels of other serum enzymes: Secondary | ICD-10-CM | POA: Diagnosis not present

## 2017-03-14 DIAGNOSIS — E872 Acidosis, unspecified: Secondary | ICD-10-CM | POA: Diagnosis present

## 2017-03-14 DIAGNOSIS — M869 Osteomyelitis, unspecified: Secondary | ICD-10-CM

## 2017-03-14 DIAGNOSIS — E11621 Type 2 diabetes mellitus with foot ulcer: Secondary | ICD-10-CM | POA: Diagnosis not present

## 2017-03-14 DIAGNOSIS — IMO0002 Reserved for concepts with insufficient information to code with codable children: Secondary | ICD-10-CM

## 2017-03-14 DIAGNOSIS — A419 Sepsis, unspecified organism: Secondary | ICD-10-CM | POA: Diagnosis not present

## 2017-03-14 DIAGNOSIS — Z6841 Body Mass Index (BMI) 40.0 and over, adult: Secondary | ICD-10-CM | POA: Diagnosis not present

## 2017-03-14 DIAGNOSIS — E1142 Type 2 diabetes mellitus with diabetic polyneuropathy: Secondary | ICD-10-CM | POA: Insufficient documentation

## 2017-03-14 DIAGNOSIS — E118 Type 2 diabetes mellitus with unspecified complications: Secondary | ICD-10-CM | POA: Diagnosis not present

## 2017-03-14 DIAGNOSIS — L97519 Non-pressure chronic ulcer of other part of right foot with unspecified severity: Secondary | ICD-10-CM | POA: Diagnosis present

## 2017-03-14 DIAGNOSIS — I5032 Chronic diastolic (congestive) heart failure: Secondary | ICD-10-CM | POA: Diagnosis not present

## 2017-03-14 DIAGNOSIS — D6489 Other specified anemias: Secondary | ICD-10-CM | POA: Diagnosis present

## 2017-03-14 DIAGNOSIS — R778 Other specified abnormalities of plasma proteins: Secondary | ICD-10-CM

## 2017-03-14 DIAGNOSIS — E1165 Type 2 diabetes mellitus with hyperglycemia: Secondary | ICD-10-CM | POA: Diagnosis not present

## 2017-03-14 DIAGNOSIS — E0840 Diabetes mellitus due to underlying condition with diabetic neuropathy, unspecified: Secondary | ICD-10-CM | POA: Diagnosis not present

## 2017-03-14 DIAGNOSIS — I252 Old myocardial infarction: Secondary | ICD-10-CM

## 2017-03-14 DIAGNOSIS — M86271 Subacute osteomyelitis, right ankle and foot: Secondary | ICD-10-CM | POA: Insufficient documentation

## 2017-03-14 DIAGNOSIS — R404 Transient alteration of awareness: Secondary | ICD-10-CM | POA: Diagnosis not present

## 2017-03-14 DIAGNOSIS — I4891 Unspecified atrial fibrillation: Secondary | ICD-10-CM | POA: Diagnosis not present

## 2017-03-14 DIAGNOSIS — Z7982 Long term (current) use of aspirin: Secondary | ICD-10-CM

## 2017-03-14 DIAGNOSIS — Z9861 Coronary angioplasty status: Secondary | ICD-10-CM

## 2017-03-14 DIAGNOSIS — E1122 Type 2 diabetes mellitus with diabetic chronic kidney disease: Secondary | ICD-10-CM | POA: Diagnosis not present

## 2017-03-14 DIAGNOSIS — I2510 Atherosclerotic heart disease of native coronary artery without angina pectoris: Secondary | ICD-10-CM

## 2017-03-14 DIAGNOSIS — I482 Chronic atrial fibrillation: Secondary | ICD-10-CM

## 2017-03-14 DIAGNOSIS — L97529 Non-pressure chronic ulcer of other part of left foot with unspecified severity: Secondary | ICD-10-CM | POA: Diagnosis present

## 2017-03-14 DIAGNOSIS — R6520 Severe sepsis without septic shock: Secondary | ICD-10-CM | POA: Diagnosis present

## 2017-03-14 DIAGNOSIS — R531 Weakness: Secondary | ICD-10-CM | POA: Diagnosis not present

## 2017-03-14 DIAGNOSIS — Z87891 Personal history of nicotine dependence: Secondary | ICD-10-CM

## 2017-03-14 DIAGNOSIS — R0600 Dyspnea, unspecified: Secondary | ICD-10-CM | POA: Diagnosis not present

## 2017-03-14 DIAGNOSIS — Z955 Presence of coronary angioplasty implant and graft: Secondary | ICD-10-CM

## 2017-03-14 DIAGNOSIS — E1110 Type 2 diabetes mellitus with ketoacidosis without coma: Secondary | ICD-10-CM | POA: Diagnosis not present

## 2017-03-14 DIAGNOSIS — R7989 Other specified abnormal findings of blood chemistry: Secondary | ICD-10-CM

## 2017-03-14 HISTORY — DX: Type 2 diabetes mellitus without complications: E11.9

## 2017-03-14 HISTORY — DX: Type 2 diabetes mellitus with ketoacidosis without coma: E11.10

## 2017-03-14 LAB — PROCALCITONIN: PROCALCITONIN: 1.61 ng/mL

## 2017-03-14 LAB — PHOSPHORUS: Phosphorus: 3 mg/dL (ref 2.5–4.6)

## 2017-03-14 LAB — GLUCOSE, CAPILLARY
GLUCOSE-CAPILLARY: 142 mg/dL — AB (ref 65–99)
GLUCOSE-CAPILLARY: 146 mg/dL — AB (ref 65–99)
GLUCOSE-CAPILLARY: 202 mg/dL — AB (ref 65–99)
GLUCOSE-CAPILLARY: 191 mg/dL — AB (ref 65–99)
Glucose-Capillary: 264 mg/dL — ABNORMAL HIGH (ref 65–99)
Glucose-Capillary: 163 mg/dL — ABNORMAL HIGH (ref 65–99)
Glucose-Capillary: 137 mg/dL — ABNORMAL HIGH (ref 65–99)
Glucose-Capillary: 202 mg/dL — ABNORMAL HIGH (ref 65–99)

## 2017-03-14 LAB — BASIC METABOLIC PANEL
ANION GAP: 11 (ref 5–15)
ANION GAP: 12 (ref 5–15)
ANION GAP: 15 (ref 5–15)
Anion gap: 13 (ref 5–15)
BUN: 33 mg/dL — AB (ref 6–20)
BUN: 32 mg/dL — ABNORMAL HIGH (ref 6–20)
BUN: 32 mg/dL — ABNORMAL HIGH (ref 6–20)
BUN: 32 mg/dL — ABNORMAL HIGH (ref 6–20)
CALCIUM: 8 mg/dL — AB (ref 8.9–10.3)
CHLORIDE: 94 mmol/L — AB (ref 101–111)
CHLORIDE: 95 mmol/L — AB (ref 101–111)
CO2: 27 mmol/L (ref 22–32)
CO2: 29 mmol/L (ref 22–32)
CO2: 27 mmol/L (ref 22–32)
CO2: 24 mmol/L (ref 22–32)
CREATININE: 2.4 mg/dL — AB (ref 0.61–1.24)
Calcium: 7.9 mg/dL — ABNORMAL LOW (ref 8.9–10.3)
Calcium: 8.1 mg/dL — ABNORMAL LOW (ref 8.9–10.3)
Calcium: 8 mg/dL — ABNORMAL LOW (ref 8.9–10.3)
Chloride: 92 mmol/L — ABNORMAL LOW (ref 101–111)
Chloride: 92 mmol/L — ABNORMAL LOW (ref 101–111)
Creatinine, Ser: 2.36 mg/dL — ABNORMAL HIGH (ref 0.61–1.24)
Creatinine, Ser: 2.23 mg/dL — ABNORMAL HIGH (ref 0.61–1.24)
Creatinine, Ser: 2.02 mg/dL — ABNORMAL HIGH (ref 0.61–1.24)
GFR calc Af Amer: 31 mL/min — ABNORMAL LOW (ref >60)
GFR calc Af Amer: 32 mL/min — ABNORMAL LOW (ref >60)
GFR calc non Af Amer: 27 mL/min — ABNORMAL LOW (ref >60)
GFR calc non Af Amer: 29 mL/min — ABNORMAL LOW (ref >60)
GFR calc non Af Amer: 33 mL/min — ABNORMAL LOW (ref >60)
GFR, EST AFRICAN AMERICAN: 34 mL/min — AB (ref >60)
GFR, EST AFRICAN AMERICAN: 38 mL/min — AB (ref >60)
GFR, EST NON AFRICAN AMERICAN: 27 mL/min — AB (ref >60)
Glucose, Bld: 448 mg/dL — ABNORMAL HIGH (ref 65–99)
Glucose, Bld: 425 mg/dL — ABNORMAL HIGH (ref 65–99)
Glucose, Bld: 268 mg/dL — ABNORMAL HIGH (ref 65–99)
Glucose, Bld: 158 mg/dL — ABNORMAL HIGH (ref 65–99)
POTASSIUM: 3.4 mmol/L — AB (ref 3.5–5.1)
Potassium: 3.5 mmol/L (ref 3.5–5.1)
Potassium: 3.4 mmol/L — ABNORMAL LOW (ref 3.5–5.1)
Potassium: 3.4 mmol/L — ABNORMAL LOW (ref 3.5–5.1)
SODIUM: 132 mmol/L — AB (ref 135–145)
SODIUM: 132 mmol/L — AB (ref 135–145)
Sodium: 133 mmol/L — ABNORMAL LOW (ref 135–145)
Sodium: 134 mmol/L — ABNORMAL LOW (ref 135–145)

## 2017-03-14 LAB — CBC WITH DIFFERENTIAL/PLATELET
BASOS PCT: 0 %
Basophils Absolute: 0 10*3/uL (ref 0.0–0.1)
EOS PCT: 0 %
Eosinophils Absolute: 0 10*3/uL (ref 0.0–0.7)
HEMATOCRIT: 28 % — AB (ref 39.0–52.0)
HEMOGLOBIN: 8.4 g/dL — AB (ref 13.0–17.0)
LYMPHS ABS: 1.4 10*3/uL (ref 0.7–4.0)
LYMPHS PCT: 4 %
MCH: 22.3 pg — AB (ref 26.0–34.0)
MCHC: 30 g/dL (ref 30.0–36.0)
MCV: 74.3 fL — AB (ref 78.0–100.0)
MONOS PCT: 6 %
Monocytes Absolute: 2.1 10*3/uL — ABNORMAL HIGH (ref 0.1–1.0)
NEUTROS ABS: 31.8 10*3/uL — AB (ref 1.7–7.7)
Neutrophils Relative %: 90 %
Platelets: 472 10*3/uL — ABNORMAL HIGH (ref 150–400)
RBC: 3.77 MIL/uL — ABNORMAL LOW (ref 4.22–5.81)
RDW: 19.4 % — ABNORMAL HIGH (ref 11.5–15.5)
WBC: 35.3 10*3/uL — ABNORMAL HIGH (ref 4.0–10.5)

## 2017-03-14 LAB — COMPREHENSIVE METABOLIC PANEL
ALBUMIN: 2.2 g/dL — AB (ref 3.5–5.0)
ALT: 38 U/L (ref 17–63)
ANION GAP: 26 — AB (ref 5–15)
AST: 129 U/L — ABNORMAL HIGH (ref 15–41)
Alkaline Phosphatase: 97 U/L (ref 38–126)
BUN: 30 mg/dL — ABNORMAL HIGH (ref 6–20)
CHLORIDE: 89 mmol/L — AB (ref 101–111)
CO2: 16 mmol/L — AB (ref 22–32)
Calcium: 8.3 mg/dL — ABNORMAL LOW (ref 8.9–10.3)
Creatinine, Ser: 3.08 mg/dL — ABNORMAL HIGH (ref 0.61–1.24)
GFR calc Af Amer: 23 mL/min — ABNORMAL LOW (ref >60)
GFR calc non Af Amer: 20 mL/min — ABNORMAL LOW (ref >60)
GLUCOSE: 454 mg/dL — AB (ref 65–99)
POTASSIUM: 4.6 mmol/L (ref 3.5–5.1)
SODIUM: 131 mmol/L — AB (ref 135–145)
TOTAL PROTEIN: 7.1 g/dL (ref 6.5–8.1)
Total Bilirubin: 1.8 mg/dL — ABNORMAL HIGH (ref 0.3–1.2)

## 2017-03-14 LAB — I-STAT CG4 LACTIC ACID, ED
LACTIC ACID, VENOUS: 8.29 mmol/L — AB (ref 0.5–1.9)
LACTIC ACID, VENOUS: 2.46 mmol/L — AB (ref 0.5–1.9)

## 2017-03-14 LAB — APTT
APTT: 53 s — AB (ref 24–36)
aPTT: 55 seconds — ABNORMAL HIGH (ref 24–36)

## 2017-03-14 LAB — TROPONIN I
TROPONIN I: 1.06 ng/mL — AB (ref <0.03)
TROPONIN I: 0.85 ng/mL — AB (ref <0.03)
Troponin I: 1.07 ng/mL (ref <0.03)

## 2017-03-14 LAB — CBG MONITORING, ED
GLUCOSE-CAPILLARY: 471 mg/dL — AB (ref 65–99)
GLUCOSE-CAPILLARY: 450 mg/dL — AB (ref 65–99)
GLUCOSE-CAPILLARY: 435 mg/dL — AB (ref 65–99)
GLUCOSE-CAPILLARY: 445 mg/dL — AB (ref 65–99)
Glucose-Capillary: 527 mg/dL (ref 65–99)
Glucose-Capillary: 518 mg/dL (ref 65–99)
Glucose-Capillary: 372 mg/dL — ABNORMAL HIGH (ref 65–99)

## 2017-03-14 LAB — I-STAT TROPONIN, ED: Troponin i, poc: 1.47 ng/mL (ref 0.00–0.08)

## 2017-03-14 LAB — LACTIC ACID, PLASMA
LACTIC ACID, VENOUS: 2.1 mmol/L — AB (ref 0.5–1.9)
Lactic Acid, Venous: 2.6 mmol/L (ref 0.5–1.9)

## 2017-03-14 LAB — MAGNESIUM: Magnesium: 1.6 mg/dL — ABNORMAL LOW (ref 1.7–2.4)

## 2017-03-14 LAB — HEPARIN LEVEL (UNFRACTIONATED): HEPARIN UNFRACTIONATED: 2.14 [IU]/mL — AB (ref 0.30–0.70)

## 2017-03-14 LAB — PROTIME-INR
INR: 3.24
Prothrombin Time: 32.8 seconds — ABNORMAL HIGH (ref 11.4–15.2)

## 2017-03-14 LAB — BETA-HYDROXYBUTYRIC ACID: Beta-Hydroxybutyric Acid: 0.15 mmol/L (ref 0.05–0.27)

## 2017-03-14 LAB — BRAIN NATRIURETIC PEPTIDE: B NATRIURETIC PEPTIDE 5: 414.8 pg/mL — AB (ref 0.0–100.0)

## 2017-03-14 MED ORDER — SODIUM CHLORIDE 0.9 % IV BOLUS (SEPSIS)
500.0000 mL | Freq: Once | INTRAVENOUS | Status: AC
  Administered 2017-03-14: 500 mL via INTRAVENOUS

## 2017-03-14 MED ORDER — INSULIN REGULAR BOLUS VIA INFUSION
0.0000 [IU] | Freq: Three times a day (TID) | INTRAVENOUS | Status: DC
  Filled 2017-03-14: qty 10

## 2017-03-14 MED ORDER — ONDANSETRON HCL 4 MG PO TABS: 4.0000 mg | ORAL_TABLET | Freq: Four times a day (QID) | ORAL | Status: DC | PRN

## 2017-03-14 MED ORDER — ACETAMINOPHEN 325 MG PO TABS: 650.0000 mg | ORAL_TABLET | Freq: Four times a day (QID) | ORAL | Status: DC | PRN

## 2017-03-14 MED ORDER — DEXTROSE-NACL 5-0.45 % IV SOLN
INTRAVENOUS | Status: DC
  Administered 2017-03-14 – 2017-03-15 (×2): via INTRAVENOUS

## 2017-03-14 MED ORDER — ASPIRIN EC 81 MG PO TBEC
81.0000 mg | DELAYED_RELEASE_TABLET | Freq: Every day | ORAL | Status: DC
  Administered 2017-03-14 – 2017-03-18 (×5): 81 mg via ORAL
  Filled 2017-03-14 (×5): qty 1

## 2017-03-14 MED ORDER — ACETAMINOPHEN 650 MG RE SUPP: 650.0000 mg | Freq: Four times a day (QID) | RECTAL | Status: DC | PRN

## 2017-03-14 MED ORDER — DEXTROSE-NACL 5-0.45 % IV SOLN: INTRAVENOUS | Status: DC

## 2017-03-14 MED ORDER — SODIUM CHLORIDE 0.9 % IV SOLN
INTRAVENOUS | Status: DC
  Administered 2017-03-14: 4.1 [IU]/h via INTRAVENOUS
  Filled 2017-03-14: qty 1

## 2017-03-14 MED ORDER — VANCOMYCIN HCL 10 G IV SOLR: 2000.0000 mg | INTRAVENOUS | Status: DC

## 2017-03-14 MED ORDER — OXYCODONE HCL 5 MG PO TABS
15.0000 mg | ORAL_TABLET | ORAL | Status: DC | PRN
  Administered 2017-03-14 (×2): 20 mg via ORAL
  Administered 2017-03-14: 15 mg via ORAL
  Administered 2017-03-15 (×2): 30 mg via ORAL
  Administered 2017-03-15: 20 mg via ORAL
  Administered 2017-03-16: 15 mg via ORAL
  Filled 2017-03-14: qty 6
  Filled 2017-03-14 (×2): qty 4
  Filled 2017-03-14: qty 3
  Filled 2017-03-14: qty 4
  Filled 2017-03-14: qty 3
  Filled 2017-03-14: qty 6

## 2017-03-14 MED ORDER — VANCOMYCIN HCL IN DEXTROSE 1-5 GM/200ML-% IV SOLN
1000.0000 mg | Freq: Once | INTRAVENOUS | Status: AC
  Administered 2017-03-14: 1000 mg via INTRAVENOUS
  Filled 2017-03-14: qty 200

## 2017-03-14 MED ORDER — MORPHINE SULFATE (PF) 4 MG/ML IV SOLN
4.0000 mg | Freq: Once | INTRAVENOUS | Status: AC
  Administered 2017-03-14: 4 mg via INTRAVENOUS
  Filled 2017-03-14: qty 1

## 2017-03-14 MED ORDER — GABAPENTIN 300 MG PO CAPS
600.0000 mg | ORAL_CAPSULE | Freq: Two times a day (BID) | ORAL | Status: DC
Start: 2017-03-14 — End: 2017-03-18
  Administered 2017-03-14 – 2017-03-18 (×8): 600 mg via ORAL
  Filled 2017-03-14 (×8): qty 2

## 2017-03-14 MED ORDER — POTASSIUM CHLORIDE CRYS ER 20 MEQ PO TBCR
40.0000 meq | EXTENDED_RELEASE_TABLET | Freq: Two times a day (BID) | ORAL | Status: DC
  Administered 2017-03-14 (×2): 40 meq via ORAL
  Filled 2017-03-14 (×2): qty 2

## 2017-03-14 MED ORDER — PIPERACILLIN-TAZOBACTAM 3.375 G IVPB
3.3750 g | Freq: Three times a day (TID) | INTRAVENOUS | Status: DC
  Administered 2017-03-14 – 2017-03-18 (×11): 3.375 g via INTRAVENOUS
  Filled 2017-03-14 (×15): qty 50

## 2017-03-14 MED ORDER — DEXTROSE 50 % IV SOLN: 25.0000 mL | INTRAVENOUS | Status: DC | PRN

## 2017-03-14 MED ORDER — SODIUM CHLORIDE 0.9 % IV SOLN: INTRAVENOUS | Status: DC

## 2017-03-14 MED ORDER — OXYCODONE HCL 5 MG PO TABS: 30.0000 mg | ORAL_TABLET | ORAL | Status: DC | PRN

## 2017-03-14 MED ORDER — ALBUTEROL SULFATE (2.5 MG/3ML) 0.083% IN NEBU: 5.0000 mg | INHALATION_SOLUTION | Freq: Once | RESPIRATORY_TRACT | Status: DC

## 2017-03-14 MED ORDER — HEPARIN (PORCINE) IN NACL 100-0.45 UNIT/ML-% IJ SOLN
2150.0000 [IU]/h | INTRAMUSCULAR | Status: DC
  Administered 2017-03-14: 1700 [IU]/h via INTRAVENOUS
  Administered 2017-03-14: 1800 [IU]/h via INTRAVENOUS
  Filled 2017-03-14 (×2): qty 250

## 2017-03-14 MED ORDER — SODIUM CHLORIDE 0.9 % IV SOLN
INTRAVENOUS | Status: DC
  Administered 2017-03-14: 14:00:00 via INTRAVENOUS

## 2017-03-14 MED ORDER — PIPERACILLIN-TAZOBACTAM 3.375 G IVPB
3.3750 g | Freq: Once | INTRAVENOUS | Status: AC
  Administered 2017-03-14: 3.375 g via INTRAVENOUS
  Filled 2017-03-14: qty 50

## 2017-03-14 MED ORDER — SODIUM CHLORIDE 0.9 % IV BOLUS (SEPSIS)
1000.0000 mL | Freq: Once | INTRAVENOUS | Status: AC
  Administered 2017-03-14: 1000 mL via INTRAVENOUS

## 2017-03-14 MED ORDER — ATORVASTATIN CALCIUM 80 MG PO TABS
80.0000 mg | ORAL_TABLET | Freq: Every evening | ORAL | Status: DC
  Administered 2017-03-15 – 2017-03-18 (×4): 80 mg via ORAL
  Filled 2017-03-14 (×4): qty 1

## 2017-03-14 MED ORDER — SODIUM CHLORIDE 0.9 % IV SOLN
INTRAVENOUS | Status: DC
  Administered 2017-03-14: 11.3 [IU]/h via INTRAVENOUS
  Administered 2017-03-14: 23:00:00 via INTRAVENOUS
  Filled 2017-03-14 (×2): qty 1

## 2017-03-14 MED ORDER — ONDANSETRON HCL 4 MG/2ML IJ SOLN: 4.0000 mg | Freq: Four times a day (QID) | INTRAMUSCULAR | Status: DC | PRN

## 2017-03-14 MED ORDER — POTASSIUM CHLORIDE 10 MEQ/100ML IV SOLN
10.0000 meq | INTRAVENOUS | Status: DC
  Administered 2017-03-14 (×2): 10 meq via INTRAVENOUS
  Filled 2017-03-14 (×2): qty 100

## 2017-03-14 MED ORDER — SODIUM CHLORIDE 0.9 % IV SOLN: INTRAVENOUS | Status: AC

## 2017-03-14 MED ORDER — MORPHINE SULFATE (PF) 4 MG/ML IV SOLN
4.0000 mg | INTRAVENOUS | Status: DC | PRN
  Administered 2017-03-15 – 2017-03-18 (×7): 4 mg via INTRAVENOUS
  Filled 2017-03-14 (×9): qty 1

## 2017-03-14 MED ORDER — METOPROLOL TARTRATE 5 MG/5ML IV SOLN
2.5000 mg | INTRAVENOUS | Status: DC | PRN
  Administered 2017-03-14: 2.5 mg via INTRAVENOUS
  Administered 2017-03-15 (×4): 5 mg via INTRAVENOUS
  Administered 2017-03-15: 2.5 mg via INTRAVENOUS
  Administered 2017-03-15 (×2): 5 mg via INTRAVENOUS
  Administered 2017-03-16: 2.5 mg via INTRAVENOUS
  Filled 2017-03-14 (×9): qty 5

## 2017-03-14 NOTE — Progress Notes (Signed)
Pharmacy Antibiotic Note  [name] is a 66 y.o. male admitted on 03/14/2017 with sepsis , possible osteo  Plan: Zosyn 3.375 gm iv q8h Vancomycin 2g q48h Monitor cx, renal fx, vt prn, adjust doses as needed for improvement  Height: 6' (182.9 cm) Weight: (!) 350 lb (158.8 kg) IBW/kg (Calculated) : 77.6  Temp (24hrs), Avg:98 F (36.7 C), Min:98 F (36.7 C), Max:98 F (36.7 C)   Recent Labs Lab 03/14/17 0412 03/14/17 0609  WBC 35.3*  --   CREATININE 3.08*  --   LATICACIDVEN  --  8.29*    Estimated Creatinine Clearance: 37.2 mL/min (A) (by C-G formula based on SCr of 3.08 mg/dL (H)).    No Known Allergies  Isaac Bliss, PharmD, BCPS, BCCCP Clinical Pharmacist Clinical phone for 03/14/2017 from 7a-3:30p: [phone] If after 3:30p, please call main pharmacy at: [phone] 03/14/2017 9:02 AM

## 2017-03-14 NOTE — Progress Notes (Signed)
ANTICOAGULATION CONSULT NOTE - Follow Up Consult  Pharmacy Consult for heparin Indication: atrial fibrillation  No Known Allergies  Patient Measurements: Height: 6' (182.9 cm) Weight: (!) 350 lb (158.8 kg) IBW/kg (Calculated) : 77.6 Heparin Dosing Weight: 115 kg  Vital Signs: Temp: 97.5 F (36.4 C) (09/03 1355) Temp Source: Oral (09/03 1355) BP: 109/72 (09/03 1355) Pulse Rate: 111 (09/03 1355)  Labs:  Recent Labs  03/14/17 0412 03/14/17 1055 03/14/17 1138 03/14/17 1441  HGB 8.4*  --   --   --   HCT 28.0*  --   --   --   PLT 472*  --   --   --   APTT  --  53*  --  55*  LABPROT  --  32.8*  --   --   INR  --  3.24  --   --   HEPARINUNFRC  --   --   --  2.14*  CREATININE 3.08* 2.40* 2.36* 2.23*  TROPONINI  --  1.07*  --  1.06*    Estimated Creatinine Clearance: 51.4 mL/min (A) (by C-G formula based on SCr of 2.23 mg/dL (H)).  Assessment: CC/HPI: 66 yo m presenting with knee pain and SOB  Pharmacy managing IV heparin for Afib. On Xarelto PTA. Initial HL and aptt do not correspond. Will dose based on aptt for now which is slightly sub-therapeutic. RN reports no issues with infusion and no s/s of bleeding.   Goal of Therapy:  Heparin level 0.3-0.7 units/ml ; aPTT 66 - 102 s  Monitor platelets by anticoagulation protocol: Yes   Plan:  Increase IV heparin to 1800 units/hr  F/u 6 hr APTT Daily HL, aPTT, CBC Monitor for bleeding/return to xarelto  Vinnie Level, PharmD., BCPS Clinical Pharmacist Pager [phone]

## 2017-03-14 NOTE — Consult Note (Signed)
ORTHOPAEDIC CONSULTATION  REQUESTING PHYSICIAN: Paul Patience, MD  Chief Complaint: Foul-smelling ulcer  HPI: [name] is a 66 y.o. male who presents with multiple medical problems including a foul-smelling ulcer on the plantar aspect of the foot.  Past Medical History:  Diagnosis Date  . Arthritis    "hands and lower back" (09/19/2014)  . CAD (coronary artery disease)    a. s/p PCI to RCA in 2012. b. prior cath in 01/2014 with elevated L/RH pressures, mild-mod CAD of LAD/LCx with patent RCA, normal EF, c/b CIN/CHF.  [name] Carpal tunnel syndrome, bilateral   . Cellulitis   . Chronic diastolic CHF (congestive heart failure) ([name])   . Chronic kidney disease (CKD), stage III (moderate)   . Chronic lower back pain   . Diabetes ([name])   . History of blood transfusion ~ 1954   "related to OR"  . Hyperlipidemia   . Hypertension   . Hypoxia    a. Qualified for home O2 at [place] in 09/2014.  [name] Lower GI bleed   . Microcytic anemia   . Morbid obesity ([name])   . Neuropathy   . OSA (obstructive sleep apnea)    "I wear nasal prongs; haven't been using prongs recently" (09/19/2014)  . PAF (paroxysmal atrial fibrillation) ([name])    TEE DCCV 09/23/2014  . Physical deconditioning   . Pilonidal cyst 1980's; 01/25/2013  . Scrotal abscess   . Type II diabetes mellitus ([name])    Past Surgical History:  Procedure Laterality Date  . ABDOMINAL SURGERY  ~ 1954   BENIGN TUMOR REMOVED  . APPENDECTOMY    . CARDIOVERSION  2010   Archie Endo 09/19/2014  . CARDIOVERSION N/A 09/23/2014   Procedure: CARDIOVERSION;  Surgeon: Lelon Perla, MD;  Location: [facility] ENDOSCOPY;  Service: Cardiovascular;  Laterality: N/A;  . CARDIOVERSION N/A 12/11/2014   Procedure: CARDIOVERSION;  Surgeon: Sanda Klein, MD;  Location: [place] ENDOSCOPY;  Service: Cardiovascular;  Laterality: N/A;  . CATARACT EXTRACTION W/PHACO Right 11/15/2012   Procedure: CATARACT EXTRACTION PHACO AND INTRAOCULAR LENS PLACEMENT ([name]);  Surgeon: Adonis Brook, MD;  Location: [place];  Service: Ophthalmology;  Laterality: Right;  . CATARACT EXTRACTION W/PHACO Left 11/29/2012   Procedure: CATARACT EXTRACTION PHACO AND INTRAOCULAR LENS PLACEMENT (IOC);  Surgeon: Adonis Brook, MD;  Location: [place];  Service: Ophthalmology;  Laterality: Left;  . CORONARY ANGIOPLASTY WITH STENT PLACEMENT  August 2012   RCA DES - [facility]  . DEBRIDEMENT  FOOT Left    debriding diabetic foot ulcers  . EYE SURGERY    . FOREIGN BODY REMOVAL Right 2014   heel,  splinter removal   . LEFT AND RIGHT HEART CATHETERIZATION WITH CORONARY ANGIOGRAM N/A 01/31/2014   Procedure: LEFT AND RIGHT HEART CATHETERIZATION WITH CORONARY ANGIOGRAM;  Surgeon: Blane Ohara, MD;  Location: [facility] CATH LAB;  Service: Cardiovascular;  Laterality: N/A;  . PARS PLANA VITRECTOMY Left 06/05/2013   Procedure: PARS PLANA VITRECTOMY WITH 23 GAUGE with Endolaser(constellation);  Surgeon: Adonis Brook, MD;  Location: [name];  Service: Ophthalmology;  Laterality: Left;  . PILONIDAL CYST EXCISION N/A 01/08/2013   Procedure: CYST EXCISION PILONIDAL EXTENSIVE;  Surgeon: Ralene Ok, MD;  Location: [name];  Service: General;  Laterality: N/A;  . PILONIDAL CYST EXCISION  1980's   "in [place]"  . TEE WITHOUT CARDIOVERSION N/A 09/23/2014   Procedure: TRANSESOPHAGEAL ECHOCARDIOGRAM (TEE);  Surgeon: Lelon Perla, MD;  Location: [name];  Service: Cardiovascular;  Laterality: N/A;  .  TONSILLECTOMY     Social History   Social History  . Marital status: Single    Spouse name: N/A  . Number of children: Y  . Years of education: N/A   Occupational History  . disabled truck driver    Social History Main Topics  . Smoking status: Former Smoker    Packs/day: 1.00    Years: 20.00    Types: Cigarettes    Quit date: 07/13/1983  . Smokeless tobacco: Never Used  . Alcohol use No  . Drug use: No  . Sexual activity: Not Currently   Other Topics Concern  . None   Social  History Narrative   Pt is single.   Lives with sister.   Has children.   Was adopted.   Daily cafffiene-2 cups of coffee and 2 sodas per day         Family History  Problem Relation Age of Onset  . Adopted: Yes  . Other Other        PT ADOPTED   - negative except otherwise stated in the family history section No Known Allergies Prior to Admission medications   Medication Sig Start Date End Date Taking? Authorizing Provider  ACCU-CHEK AVIVA PLUS test strip USE AS INSTRUCTED TO TEST BLOOD SUGAR 6 TIMES DAILY BEFORE MEALS. DX CODE- E11.65 10/26/16  Yes Elayne Snare, MD  aspirin EC 81 MG EC tablet Take 1 tablet (81 mg total) by mouth daily. 06/17/16  Yes [name], MD  atorvastatin (LIPITOR) 80 MG tablet Take 1 tablet (80 mg total) by mouth daily. 05/25/16  Yes Langeland, Dawn T, MD  BD INSULIN SYRINGE ULTRAFINE 31G X 15/64" 0.3 ML MISC USE TO IJNECT INSULIN 3 TIMES DAILY. 08/20/16  Yes Elayne Snare, MD  Blood Glucose Monitoring Suppl (ACCU-CHEK AVIVA PLUS) w/Device KIT USE TO CHECK BLOOD SUGAR 6 TIMES PER DAY DX CODE E11.65 08/05/16  Yes Elayne Snare, MD  docusate sodium (COLACE) 100 MG capsule Take 1 capsule (100 mg total) by mouth daily. 01/26/17  Yes Barton Dubois, MD  gabapentin (NEURONTIN) 300 MG capsule Take 2 capsules (600 mg total) by mouth 2 (two) times daily. 05/25/16  Yes Langeland, Dawn T, MD  hydrocerin (EUCERIN) CREA Apply 1 application topically 3 (three) times daily. legs 05/25/16  Yes Langeland, Dawn T, MD  Hypromellose (NATURAL BALANCE TEARS OP) Place 1 drop into both eyes daily as needed (for dry eyes).    Yes [provider]  insulin aspart (NOVOLOG) 100 UNIT/ML injection Inject 100 Units into the skin 3 (three) times daily before meals.   Yes [provider]  Insulin Pen Needle 31G X 5 MM MISC Use to inject insulin 4 times daily 08/24/16  Yes Elayne Snare, MD  KLOR-CON M20 20 MEQ tablet TAKE 2 TABLETS BY MOUTH TWICE A DAY 02/04/17  Yes Lelon Perla, MD    Lancets (ACCU-CHEK MULTICLIX) lancets Use to test blood sugar 6 times daily- Dx code- E11.65 08/31/16  Yes Elayne Snare, MD  LANTUS 100 UNIT/ML injection INJECT 50 UNITS INTO THE SKIN 2 TIMES DAILY. Patient taking differently: INJECT 30-60 UNITS INTO THE SKIN 2 TIMES DAILY. 60 units in the am ans 30 units in the evening 02/04/17  Yes Elayne Snare, MD  metolazone (ZAROXOLYN) 5 MG tablet Take 52m twice weekly on Mondays and Fridays. 09/06/16  Yes Bensimhon, DShaune Pascal MD  metoprolol tartrate (LOPRESSOR) 50 MG tablet Take 1 tablet (50 mg total) by mouth 2 (two) times daily. 01/27/17  Yes Jegede,  Olugbemiga E, MD  Multiple Vitamin (MULTIVITAMIN WITH MINERALS) TABS Take 1 tablet by mouth daily.   Yes [provider]  oxyCODONE (ROXICODONE) 30 MG immediate release tablet Take 1 tablet (30 mg total) by mouth every 4 (four) hours as needed for severe pain. 01/25/17  Yes Barton Dubois, MD  polyethylene glycol [facility] / GLYCOLAX) packet Take 17 g by mouth daily. 01/26/17  Yes Barton Dubois, MD  rivaroxaban (XARELTO) 20 MG TABS tablet TAKE 1 TABLET BY MOUTH EVERY DAY WITH SUPPER 03/04/17  Yes Jegede, Olugbemiga E, MD  spironolactone (ALDACTONE) 25 MG tablet Take 25 mg by mouth daily.   Yes [provider]  torsemide (DEMADEX) 20 MG tablet Take 4 tablets (80 mg total) by mouth 2 (two) times daily. 06/28/16  Yes Almyra Deforest, PA   Dg Chest 2 View  Result Date: 03/14/2017 CLINICAL DATA:  Dyspnea and weakness tonight. EXAM: CHEST  2 VIEW COMPARISON:  01/22/2017 FINDINGS: Unchanged moderate cardiomegaly. No airspace consolidation. Mild vascular and interstitial prominence. No pleural effusions. IMPRESSION: Cardiomegaly and mild vascular/ interstitial prominence. No alveolar edema. No effusions. Electronically Signed   By: Andreas Newport M.D.   On: 03/14/2017 04:02   Dg Foot 2 Views Right  Result Date: 03/14/2017 CLINICAL DATA:  Diabetic right foot ulcers. EXAM: RIGHT FOOT - 2 VIEW COMPARISON:   12/23/2015. FINDINGS: Soft tissue ulceration with gas involving the plantar surface underlying the third, fourth and fifth metatarsals. Osteolysis at the base of the fifth metatarsal which is unchanged from the June, 2017 examination. New osteolysis suspected in the mid shaft of the fifth metatarsal, associated with periosteal new bone. Osteolysis involving the cuboid bone, new since the prior examination Generalized osseous demineralization. No visible acute fractures. Joint space narrowing involving the midfoot. 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 Electronically Signed   By: Evangeline Dakin M.D.   On: 03/14/2017 08:42   - pertinent xrays, CT, MRI studies were reviewed and independently interpreted  Positive ROS: All other systems have been reviewed and were otherwise negative with the exception of those mentioned in the HPI and as above.  Physical Exam: General: Alert, oriented, patient is in moderate distress. Psychiatric: Patient is competent for consent with normal mood and affect Lymphatic: No axillary or cervical lymphadenopathy Cardiovascular: Bilateral pedal edema Respiratory: Patient is short of breath while seated. GI: Patient's abdomen is distended chronically  Skin: Patient has brawny skin color changes in both lower extremities consistent with venous and lymphatic insufficiency. There is massive swelling and dermatitis in both lower extremities.   Neurologic: Patient does not have protective sensation bilateral lower extremities.   MUSCULOSKELETAL:  Examination of the right foot patient does have a palpable pulse. Radiographs shows calcification of the arteries into the foot. Patient has a large necrotic foul-smelling ulcer on the plantar aspect of the right foot. After informed consent the skin and soft tissue was removed, and debrided back to bleeding granulation tissue. There was still the ischemic  ulcerative changes. Ulcers approximately 6 x 10 cm. There is no purulent drainage however there is as foul-smelling odor. Review of the radiographs shows destructive changes chronic and acutely in the fifth metatarsal consistent with osteomyelitis.  Assessment: Assessment: Chronic and acute osteomyelitis right foot with peripheral vascular disease with large ulceration concerning for deep abscess and osteomyelitis.  Plan: Plan: Ankle-brachial indices are ordered I will also order an MRI scan to evaluate the possibility for a large deep abscess. Anticipate  that once patient is stabilized we will need to proceed with a transtibial amputation. Possible surgery on Wednesday if patient is stable by then.  Thank you for the consult and the opportunity to see [name], [place] [phone] 9:34 AM

## 2017-03-14 NOTE — ED Notes (Signed)
Pt CBG, 372.

## 2017-03-14 NOTE — Progress Notes (Signed)
ANTICOAGULATION CONSULT NOTE - Initial Consult  Pharmacy Consult for heparin Indication: atrial fibrillation  No Known Allergies  Patient Measurements: Height: 6' (182.9 cm) Weight: (!) 350 lb (158.8 kg) IBW/kg (Calculated) : 77.6 Heparin Dosing Weight: 115 kg  Vital Signs: Temp: 98 F (36.7 C) (09/03 0318) Temp Source: Oral (09/03 0318) BP: 115/75 (09/03 0730) Pulse Rate: 133 (09/03 0730)  Labs:  Recent Labs  03/14/17 0412  HGB 8.4*  HCT 28.0*  PLT 472*  CREATININE 3.08*    Estimated Creatinine Clearance: 37.2 mL/min (A) (by C-G formula based on SCr of 3.08 mg/dL (H)).  Assessment: CC/HPI: 66 yo m presenting with knee pain and SOB  PMH: DM, CKD, CHF, HLD, HTN, Afib, OSA, hx of GI bleed  Anticoag: xarelto pta - last dose 9/1 Hep gtt started on admit  Renal: SCr 3.08 (BL around 1.3-1.4)  Heme/Onc: H&H 8.4/28, Plt 472  Goal of Therapy:  Heparin level 0.3-0.7 units/ml ; aPTT 66 - 102 s  Monitor platelets by anticoagulation protocol: Yes   Plan:  Heparin gtt at 1700 units/hr (no bolus) Initial aptt/HL 1700 Daily HL, aPTT, CBC Monitor for bleeding/return to xarelto  Isaac Bliss, PharmD, BCPS, BCCCP Clinical Pharmacist Clinical phone for 03/14/2017 from 7a-3:30p: [phone] If after 3:30p, please call main pharmacy at: [phone] 03/14/2017 9:04 AM

## 2017-03-14 NOTE — ED Notes (Signed)
Patient transported to X-ray 

## 2017-03-14 NOTE — ED Notes (Signed)
Admitting MD at bedside.

## 2017-03-14 NOTE — ED Notes (Signed)
CBG 518; RN notified

## 2017-03-14 NOTE — H&P (Signed)
History and Physical    [name] AJG:[id number] DOB: 05/07/1951 DOA: 03/14/2017  PCP: [name], MD Patient coming from:   Chief Complaint: Knee pain and SOB  HPI: [name] is a 66 y.o. male with medical history significant of obesity, diabetes, C KD, CHF, hyperlipidemia, hypertension, PAF, OSA, chronic pain, GI bleed.  Level V caveat applies as patient is unable to provide reliable history at this time. There also seemed to be inconsistencies between the history given to myself and given to nursing staff and EDP.  Patient reports several day history of ongoing worsening bilateral knee pain. States this became so severe on day of admission she was able to get up off the toilet. Associated with generalized weakness. States that he has seen orthopedic surgery in the past who told him that he has bone-on-bone in both knees and severe arthritis. She has not tried anything for her symptoms.  Patient also states that on the day of admission after being unable to up off of the toilet due to knee pain he developed shortness of breath. Patient uses home oxygen intermittently. EMS was called and brought patient to the emergency room.  Patient states that prior to onset of symptoms of dizziness normal state of health. Patient currently denies chest pain, palpitations, nausea, vomiting, abdominal pain, dysuria, frequency. Patient does endorse a feeling of confusion.  Patient states he has not taken any of his home medications for the last couple of days. When asked why he simply states that he doesn't know.   ED Course: DKA noted. Started on insulin drip. Unknown infectious etiology suspected and started on vancomycin and Zosyn. Cardiology called for elevated troponin.  Review of Systems: As per HPI otherwise all other systems reviewed and are negative  Ambulatory Status: minimally ambulatory  Past Medical History:  Diagnosis Date  . Arthritis    "hands and lower back"  (09/19/2014)  . CAD (coronary artery disease)    a. s/p PCI to RCA in 2012. b. prior cath in 01/2014 with elevated L/RH pressures, mild-mod CAD of LAD/LCx with patent RCA, normal EF, c/b CIN/CHF.  [name] Carpal tunnel syndrome, bilateral   . Cellulitis   . Chronic diastolic CHF (congestive heart failure) ([place])   . Chronic kidney disease (CKD), stage III (moderate)   . Chronic lower back pain   . Diabetes ([place])   . History of blood transfusion ~ 1954   "related to OR"  . Hyperlipidemia   . Hypertension   . Hypoxia    a. Qualified for home O2 at [facility] in 09/2014.  [name] Lower GI bleed   . Microcytic anemia   . Morbid obesity ([place])   . Neuropathy   . OSA (obstructive sleep apnea)    "I wear nasal prongs; haven't been using prongs recently" (09/19/2014)  . PAF (paroxysmal atrial fibrillation) ([place])    TEE DCCV 09/23/2014  . Physical deconditioning   . Pilonidal cyst 1980's; 01/25/2013  . Scrotal abscess   . Type II diabetes mellitus ([place])     Past Surgical History:  Procedure Laterality Date  . ABDOMINAL SURGERY  ~ 1954   BENIGN TUMOR REMOVED  . APPENDECTOMY    . CARDIOVERSION  2010   Archie Endo 09/19/2014  . CARDIOVERSION N/A 09/23/2014   Procedure: CARDIOVERSION;  Surgeon: Lelon Perla, MD;  Location: [facility] ENDOSCOPY;  Service: Cardiovascular;  Laterality: N/A;  . CARDIOVERSION N/A 12/11/2014   Procedure: CARDIOVERSION;  Surgeon: Sanda Klein, MD;  Location: [facility] ENDOSCOPY;  Service: Cardiovascular;  Laterality: N/A;  . CATARACT EXTRACTION W/PHACO Right 11/15/2012   Procedure: CATARACT EXTRACTION PHACO AND INTRAOCULAR LENS PLACEMENT (IOC);  Surgeon: Adonis Brook, MD;  Location: [place];  Service: Ophthalmology;  Laterality: Right;  . CATARACT EXTRACTION W/PHACO Left 11/29/2012   Procedure: CATARACT EXTRACTION PHACO AND INTRAOCULAR LENS PLACEMENT (IOC);  Surgeon: Adonis Brook, MD;  Location: [place];  Service: Ophthalmology;  Laterality: Left;  . CORONARY ANGIOPLASTY WITH STENT PLACEMENT  August 2012   RCA  DES - [facility]  . DEBRIDEMENT  FOOT Left    debriding diabetic foot ulcers  . EYE SURGERY    . FOREIGN BODY REMOVAL Right 2014   heel,  splinter removal   . LEFT AND RIGHT HEART CATHETERIZATION WITH CORONARY ANGIOGRAM N/A 01/31/2014   Procedure: LEFT AND RIGHT HEART CATHETERIZATION WITH CORONARY ANGIOGRAM;  Surgeon: Blane Ohara, MD;  Location: [facility] CATH LAB;  Service: Cardiovascular;  Laterality: N/A;  . PARS PLANA VITRECTOMY Left 06/05/2013   Procedure: PARS PLANA VITRECTOMY WITH 23 GAUGE with Endolaser(constellation);  Surgeon: Adonis Brook, MD;  Location: [name];  Service: Ophthalmology;  Laterality: Left;  . PILONIDAL CYST EXCISION N/A 01/08/2013   Procedure: CYST EXCISION PILONIDAL EXTENSIVE;  Surgeon: Ralene Ok, MD;  Location: [place];  Service: General;  Laterality: N/A;  . PILONIDAL CYST EXCISION  1980's   "in [place]"  . TEE WITHOUT CARDIOVERSION N/A 09/23/2014   Procedure: TRANSESOPHAGEAL ECHOCARDIOGRAM (TEE);  Surgeon: Lelon Perla, MD;  Location: [facility] ENDOSCOPY;  Service: Cardiovascular;  Laterality: N/A;  . TONSILLECTOMY      Social History   Social History  . Marital status: Single    Spouse name: N/A  . Number of children: Y  . Years of education: N/A   Occupational History  . disabled truck driver    Social History Main Topics  . Smoking status: Former Smoker    Packs/day: 1.00    Years: 20.00    Types: Cigarettes    Quit date: 07/13/1983  . Smokeless tobacco: Never Used  . Alcohol use No  . Drug use: No  . Sexual activity: Not Currently   Other Topics Concern  . Not on file   Social History Narrative   Pt is single.   Lives with sister.   Has children.   Was adopted.   Daily cafffiene-2 cups of coffee and 2 sodas per day          No Known Allergies  Family History  Problem Relation Age of Onset  . Adopted: Yes  . Other Other        PT ADOPTED      Prior to Admission medications   Medication Sig Start  Date End Date Taking? Authorizing Provider  ACCU-CHEK AVIVA PLUS test strip USE AS INSTRUCTED TO TEST BLOOD SUGAR 6 TIMES DAILY BEFORE MEALS. DX CODE- E11.65 10/26/16  Yes Elayne Snare, MD  aspirin EC 81 MG EC tablet Take 1 tablet (81 mg total) by mouth daily. 06/17/16  Yes [name], MD  atorvastatin (LIPITOR) 80 MG tablet Take 1 tablet (80 mg total) by mouth daily. 05/25/16  Yes Langeland, Dawn T, MD  BD INSULIN SYRINGE ULTRAFINE 31G X 15/64" 0.3 ML MISC USE TO IJNECT INSULIN 3 TIMES DAILY. 08/20/16  Yes Elayne Snare, MD  Blood Glucose Monitoring Suppl (ACCU-CHEK AVIVA PLUS) w/Device KIT USE TO CHECK BLOOD SUGAR 6 TIMES PER DAY DX CODE E11.65 08/05/16  Yes Elayne Snare, MD  docusate sodium (COLACE)  100 MG capsule Take 1 capsule (100 mg total) by mouth daily. 01/26/17  Yes Barton Dubois, MD  gabapentin (NEURONTIN) 300 MG capsule Take 2 capsules (600 mg total) by mouth 2 (two) times daily. 05/25/16  Yes Langeland, Dawn T, MD  hydrocerin (EUCERIN) CREA Apply 1 application topically 3 (three) times daily. legs 05/25/16  Yes Langeland, Dawn T, MD  Hypromellose (NATURAL BALANCE TEARS OP) Place 1 drop into both eyes daily as needed (for dry eyes).    Yes [provider]  insulin aspart (NOVOLOG) 100 UNIT/ML injection Inject 100 Units into the skin 3 (three) times daily before meals.   Yes [provider]  Insulin Pen Needle 31G X 5 MM MISC Use to inject insulin 4 times daily 08/24/16  Yes Elayne Snare, MD  KLOR-CON M20 20 MEQ tablet TAKE 2 TABLETS BY MOUTH TWICE A DAY 02/04/17  Yes Lelon Perla, MD  Lancets (ACCU-CHEK MULTICLIX) lancets Use to test blood sugar 6 times daily- Dx code- E11.65 08/31/16  Yes Elayne Snare, MD  LANTUS 100 UNIT/ML injection INJECT 50 UNITS INTO THE SKIN 2 TIMES DAILY. Patient taking differently: INJECT 30-60 UNITS INTO THE SKIN 2 TIMES DAILY. 60 units in the am ans 30 units in the evening 02/04/17  Yes Elayne Snare, MD  metolazone (ZAROXOLYN) 5 MG tablet Take 60m  twice weekly on Mondays and Fridays. 09/06/16  Yes Bensimhon, DShaune Pascal MD  metoprolol tartrate (LOPRESSOR) 50 MG tablet Take 1 tablet (50 mg total) by mouth 2 (two) times daily. 01/27/17  Yes JTresa Garter MD  Multiple Vitamin (MULTIVITAMIN WITH MINERALS) TABS Take 1 tablet by mouth daily.   Yes [provider]  oxyCODONE (ROXICODONE) 30 MG immediate release tablet Take 1 tablet (30 mg total) by mouth every 4 (four) hours as needed for severe pain. 01/25/17  Yes MBarton Dubois MD  polyethylene glycol ([facility]/ GLYCOLAX) packet Take 17 g by mouth daily. 01/26/17  Yes MBarton Dubois MD  rivaroxaban (XARELTO) 20 MG TABS tablet TAKE 1 TABLET BY MOUTH EVERY DAY WITH SUPPER 03/04/17  Yes Jegede, Olugbemiga E, MD  spironolactone (ALDACTONE) 25 MG tablet Take 25 mg by mouth daily.   Yes [provider]  torsemide (DEMADEX) 20 MG tablet Take 4 tablets (80 mg total) by mouth 2 (two) times daily. 06/28/16  Yes MAlmyra Deforest PUtah   Physical Exam: Vitals:   03/14/17 0515 03/14/17 0630 03/14/17 0701 03/14/17 0730  BP: 115/60 107/70 107/66 115/75  Pulse: (!) 133 (!) 127 100 (!) 133  Resp: 19 (!) 21 14 14   Temp:      TempSrc:      SpO2: 94% 98% 95% 99%  Weight:      Height:         General: Anxious, sitting up in bed. Eyes:  PERRL, EOMI, normal lids, iris ENT: Dry mucous membranes, normal hearing Cardiovascular:  RRR, no m/r/g. 3+ bilateral lower extremity pitting edema Respiratory: Increased effort. Difficult to fully appreciate due to large body habitus, no wheezes rhonchi or crackles. Abdomen: Obese, nontender, nondistended Skin: Bilateral anterior erythema of the lower extremities. Marked erythema, induration of the right lower extremity with black central eschar and surrounding skin breakdown with central necrosis Musculoskeletal: No swelling/effusions of the knees bilaterally, full range of motion of upper extremities, no bony upper boundaries. Psychiatric: Alert and  oriented to person and place. Confused on specific details. Pleasant. Follows basic commands. Neurologic:  CN 2-12 grossly intact, moves all extremities in coordinated fashion,  sensation intact  Labs on Admission: I have personally reviewed following labs and imaging studies  CBC:  Recent Labs Lab 03/14/17 0412  WBC 35.3*  NEUTROABS 31.8*  HGB 8.4*  HCT 28.0*  MCV 74.3*  PLT 016*   Basic Metabolic Panel:  Recent Labs Lab 03/14/17 0412  NA 131*  K 4.6  CL 89*  CO2 16*  GLUCOSE 454*  BUN 30*  CREATININE 3.08*  CALCIUM 8.3*   GFR: Estimated Creatinine Clearance: 37.2 mL/min (A) (by C-G formula based on SCr of 3.08 mg/dL (H)). Liver Function Tests:  Recent Labs Lab 03/14/17 0412  AST 129*  ALT 38  ALKPHOS 97  BILITOT 1.8*  PROT 7.1  ALBUMIN 2.2*   No results for input(s): LIPASE, AMYLASE in the last 168 hours. No results for input(s): AMMONIA in the last 168 hours. Coagulation Profile: No results for input(s): INR, PROTIME in the last 168 hours. Cardiac Enzymes: No results for input(s): CKTOTAL, CKMB, CKMBINDEX, TROPONINI in the last 168 hours. BNP (last 3 results) No results for input(s): PROBNP in the last 8760 hours. HbA1C: No results for input(s): HGBA1C in the last 72 hours. CBG:  Recent Labs Lab 03/14/17 0556 03/14/17 0718 03/14/17 0805  GLUCAP 471* 527* 518*   Lipid Profile: No results for input(s): CHOL, HDL, LDLCALC, TRIG, CHOLHDL, LDLDIRECT in the last 72 hours. Thyroid Function Tests: No results for input(s): TSH, T4TOTAL, FREET4, T3FREE, THYROIDAB in the last 72 hours. Anemia Panel: No results for input(s): VITAMINB12, FOLATE, FERRITIN, TIBC, IRON, RETICCTPCT in the last 72 hours. Urine analysis:    Component Value Date/Time   COLORURINE YELLOW 01/22/2017 0646   APPEARANCEUR CLEAR 01/22/2017 0646   LABSPEC 1.011 01/22/2017 0646   PHURINE 5.0 01/22/2017 0646   GLUCOSEU >=500 (A) 01/22/2017 0646   HGBUR NEGATIVE 01/22/2017 0646    BILIRUBINUR NEGATIVE 01/22/2017 0646   [place] 01/22/2017 0646   PROTEINUR NEGATIVE 01/22/2017 0646   UROBILINOGEN 1.0 11/27/2014 0755   NITRITE NEGATIVE 01/22/2017 0646   LEUKOCYTESUR NEGATIVE 01/22/2017 0646    Creatinine Clearance: Estimated Creatinine Clearance: 37.2 mL/min (A) (by C-G formula based on SCr of 3.08 mg/dL (H)).  Sepsis Labs: @LABRCNTIP (procalcitonin:4,lacticidven:4) )No results found for this or any previous visit (from the past 240 hour(s)).   Radiological Exams on Admission: Dg Chest 2 View  Result Date: 03/14/2017 CLINICAL DATA:  Dyspnea and weakness tonight. EXAM: CHEST  2 VIEW COMPARISON:  01/22/2017 FINDINGS: Unchanged moderate cardiomegaly. No airspace consolidation. Mild vascular and interstitial prominence. No pleural effusions. IMPRESSION: Cardiomegaly and mild vascular/ interstitial prominence. No alveolar edema. No effusions. Electronically Signed   By: Andreas Newport M.D.   On: 03/14/2017 04:02    EKG: Independently reviewed. Atrophic fibrillation, tachycardia  Assessment/Plan Active Problems:   Diabetes mellitus type 2, uncontrolled, with complications (HCC)   Hyperlipidemia   Essential hypertension, benign   Atrial fibrillation   Chronic diastolic congestive heart failure (HCC)   CKD (chronic kidney disease) stage 3, GFR 30-59 ml/min   Acute kidney injury (HCC)   Lactic acidosis   Osteoarthritis of both knees   DKA (diabetic ketoacidoses) (HCC)   Elevated troponin    Sepsis: Likely source is R foot gangrene. Pt confused so history limited. Sock and bandaging removed to reveal necrotic tissue throughout the lateral R foot w/ marked edema and skin breakdown. Says he's seen Dr. Sharol Given in the past. States last dressing change was 3 days ago though this seems very doubtful. Lactic acid 8.29, tachycardia, hypotension, confusion, WBC  35.3. Overall condition worsened due to DKA. - Sepsis protocol, continue Vanc/Zosyn - Right lower extremity  imaging to evaluate for osteomyelitis - Consult Dr Sharol Given  - ABI - anticipate R foot amputation  DKA: Patient endorses not taking insulin for several days. Benign gap 26, bicarbonate 16, glucose 527.  -DKA protocol  - Glucose stabilizer  Acute on chronic renal failure: Codeine 3.08. Baseline 1.4. Likely secondary to comminution of DKA and sepsis. Anticipate rapid recovery with treatment of above conditions - Treatment plan as above - BMP in a.m.  Elevated troponin/CHF/Afib: likely secondary to ongoing sepsis and DKA in the setting of atrial fibrillation and elevated heart rate. EKG without appreciable ACS. Cardiology consult by EDP. Last echo showing mild diastolic dysfunction and an EF of 60%. BNP 414.8. Doubt CHF exacerbation is contributing. - Cycle troponins - Telemetry - Treatment of above conditions likely to improve heart rate and elevation of troponin - Hold Xarelto in favor of Heparin due to AKI and likely surgery this admission - Continue ASA, Lipitor - hold home spironolactone, metolazone, Lasix - Lopressor prn sustained HR >120 and SBP > 110 - Further recommendations per cardiology  Bilateral Knee Pain:  Imaging performed on 7/14 showing mild to mod OA. No need for further imaging at this time as pt confused and unlikely to have bilat acute knee condition outside known OA.  - continue home narcotics - further mgt per ortho.   Peripheral neuropathy: likely due to DM - continue neurontin  HTN: hypotensive due to sepsis and dehydration - hold home metop    DVT prophylaxis: heparin - full dose  Code Status: full  Family Communication: none  Disposition Plan: pending eval by ortho for possible amputation and resolution of DKA and sepsis  Consults called: ortho  Admission status: inptaitnet - SDU    Eduarda Scrivens J MD [name]  If 7PM-7AM, please contact night-coverage [email or web address] Password [facility]  03/14/2017, 8:34 AM      Critical Care Time This  patient is critically ill requiring frequent monitoring and support due to their life/organ threatening condition. This care involves a high complexity of medical decision making. Greater than 70 minutes spent in direct patient care and coordination.

## 2017-03-14 NOTE — ED Provider Notes (Addendum)
[facility] DEPT Provider Note   CSN: [id number] Arrival date & time: 03/14/17  4098     History   Chief Complaint Chief Complaint  Patient presents with  . Shortness of Breath  . Atrial Fibrillation    HPI [name] is a 66 y.o. male.  Patient is a 66 year old male with extensive past medical history including morbid obesity, coronary artery disease, CHF, chronic renal insufficiency, chronic back and knee pain, diabetes, obstructive sleep apnea, and paroxysmal atrial fibrillation. He presents today for evaluation of knee pain and shortness of breath. Reports the past several days he has had increased dyspnea on exertion and is requiring his home oxygen more and more frequently. This evening he went to the bathroom and his "knees locked up on him". He was unable to ambulate and 911 was called. He denies to me he is experiencing any chest pain, fever, or productive cough.   The history is provided by the patient.  Shortness of Breath  This is a recurrent problem. The average episode lasts 3 days. The problem occurs continuously.The problem has been gradually worsening. Pertinent negatives include no fever, no sputum production and no chest pain. He has tried nothing for the symptoms. The treatment provided no relief. Associated medical issues include CAD, heart failure and past MI.  Atrial Fibrillation  Associated symptoms include shortness of breath. Pertinent negatives include no chest pain.    Past Medical History:  Diagnosis Date  . Arthritis    "hands and lower back" (09/19/2014)  . CAD (coronary artery disease)    a. s/p PCI to RCA in 2012. b. prior cath in 01/2014 with elevated L/RH pressures, mild-mod CAD of LAD/LCx with patent RCA, normal EF, c/b CIN/CHF.  [name] Carpal tunnel syndrome, bilateral   . Cellulitis   . Chronic diastolic CHF (congestive heart failure) ([place])   . Chronic kidney disease (CKD), stage III (moderate)   . Chronic lower back pain   . History of  blood transfusion ~ 1954   "related to OR"  . Hyperlipidemia   . Hypertension   . Hypoxia    a. Qualified for home O2 at DC in 09/2014.  [name] Lower GI bleed   . Microcytic anemia   . Morbid obesity ([place])   . Neuropathy   . OSA (obstructive sleep apnea)    "I wear nasal prongs; haven't been using prongs recently" (09/19/2014)  . PAF (paroxysmal atrial fibrillation) ([place])    TEE DCCV 09/23/2014  . Physical deconditioning   . Pilonidal cyst 1980's; 01/25/2013  . Scrotal abscess   . Type II diabetes mellitus [facility])     Patient Active Problem List   Diagnosis Date Noted  . Osteoarthritis of both knees   . Diabetic nephropathy associated with type 2 diabetes mellitus ([place])   . Bilateral lower leg cellulitis 01/22/2017  . Venous stasis 08/06/2016  . At high risk for falls 07/21/2016  . CHF exacerbation ([place]) 06/10/2016  . Lactic acidosis 06/09/2016  . Syncope and collapse 03/05/2016  . Acute on chronic renal insufficiency 03/05/2016  . Obesity 12/30/2015  . Scrotal swelling   . Foot fracture   . 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 Lower GI bleed   . Pressure ulcer 12/24/2015  . Rhabdomyolysis   . Acute kidney injury ([place]) 12/23/2015  . Back pain 12/23/2015  . Fall   . Metatarsal fracture   . Traumatic rhabdomyolysis ([place])   . Hyperkalemia   . Diabetes mellitus type  2 in obese ([place])   . Lisfranc dislocation   . Hyperglycemia   . Sepsis ([place]) 09/08/2015  . Scrotal wall abscess 09/08/2015  . AKI (acute kidney injury) ([place]) 09/08/2015  . Chest wall pain   . On amiodarone therapy 12/08/2014  . Respiratory failure with hypoxia ([place]) 12/04/2014  . Hypoxia 12/04/2014  . Acute on chronic respiratory failure with hypoxia ([place])   . Atrial fibrillation with RVR ([place]) 09/20/2014  . Acute on chronic diastolic CHF (congestive heart failure) ([place]) 09/19/2014  . Atrial fibrillation with rapid ventricular response ([place]) 09/19/2014  . Acute respiratory failure ([place])  09/19/2014  . Abnormal nuclear cardiac imaging test 02/12/2014  . Dyspnea 02/12/2014  . Chronic anticoagulation- Coumadin 02/12/2014  . CKD (chronic kidney disease) stage 3, GFR 30-59 ml/min 02/12/2014  . Chronic diastolic congestive heart failure ([place]) 02/11/2014  . Encounter for therapeutic drug monitoring 08/03/2013  . Pilonidal cyst 01/25/2013  . Perirectal cellulitis 09/06/2012  . Long term (current) use of anticoagulants 11/18/2011  . Obstructive sleep apnea- unable to tolerate c-pap 08/04/2011  . CAD S/P percutaneous coronary angioplasty 08/04/2011  . Diabetes mellitus type 2, uncontrolled, with complications ([place]) 57/84/6962  . ANEMIA-IRON DEFICIENCY 01/06/2009  . OTHER AND UNSPECIFIED COAGULATION DEFECTS 01/06/2009  . RECTAL BLEEDING 01/06/2009  . THYROID STIMULATING HORMONE, ABNORMAL 01/02/2009  . ANEMIA 01/02/2009  . CARDIOVASCULAR FUNCTION STUDY, ABNORMAL 01/02/2009  . Essential hypertension, benign 10/22/2008  . Atrial fibrillation 10/22/2008  . DIAB W/UNSPEC COMP TYPE II/UNSPEC TYPE UNCNTRL 09/23/2008  . Hyperlipidemia 09/23/2008  . HYPOKALEMIA 09/23/2008  . Obesity, Class III, BMI 40-49.9 (morbid obesity) ([place]) 09/23/2008  . CELLULITIS AND ABSCESS OF LEG EXCEPT FOOT 09/23/2008    Past Surgical History:  Procedure Laterality Date  . ABDOMINAL SURGERY  ~ 1954   BENIGN TUMOR REMOVED  . APPENDECTOMY    . CARDIOVERSION  2010   Archie Endo 09/19/2014  . CARDIOVERSION N/A 09/23/2014   Procedure: CARDIOVERSION;  Surgeon: Lelon Perla, MD;  Location: [facility] ENDOSCOPY;  Service: Cardiovascular;  Laterality: N/A;  . CARDIOVERSION N/A 12/11/2014   Procedure: CARDIOVERSION;  Surgeon: Sanda Klein, MD;  Location: [facility] ENDOSCOPY;  Service: Cardiovascular;  Laterality: N/A;  . CATARACT EXTRACTION W/PHACO Right 11/15/2012   Procedure: CATARACT EXTRACTION PHACO AND INTRAOCULAR LENS PLACEMENT ([place]);  Surgeon: Adonis Brook, MD;  Location: [facility];  Service: Ophthalmology;  Laterality: Right;    . CATARACT EXTRACTION W/PHACO Left 11/29/2012   Procedure: CATARACT EXTRACTION PHACO AND INTRAOCULAR LENS PLACEMENT (IOC);  Surgeon: Adonis Brook, MD;  Location: [facility];  Service: Ophthalmology;  Laterality: Left;  . CORONARY ANGIOPLASTY WITH STENT PLACEMENT  August 2012   RCA DES - [facility]  . DEBRIDEMENT  FOOT Left    debriding diabetic foot ulcers  . EYE SURGERY    . FOREIGN BODY REMOVAL Right 2014   heel,  splinter removal   . LEFT AND RIGHT HEART CATHETERIZATION WITH CORONARY ANGIOGRAM N/A 01/31/2014   Procedure: LEFT AND RIGHT HEART CATHETERIZATION WITH CORONARY ANGIOGRAM;  Surgeon: Blane Ohara, MD;  Location: [facility] CATH LAB;  Service: Cardiovascular;  Laterality: N/A;  . PARS PLANA VITRECTOMY Left 06/05/2013   Procedure: PARS PLANA VITRECTOMY WITH 23 GAUGE with Endolaser(constellation);  Surgeon: Adonis Brook, MD;  Location: [facility];  Service: Ophthalmology;  Laterality: Left;  . PILONIDAL CYST EXCISION N/A 01/08/2013   Procedure: CYST EXCISION PILONIDAL EXTENSIVE;  Surgeon: Ralene Ok, MD;  Location: [facility];  Service: General;  Laterality: N/A;  . PILONIDAL CYST EXCISION  1980's   "in [place]"  . TEE WITHOUT CARDIOVERSION N/A 09/23/2014   Procedure: TRANSESOPHAGEAL ECHOCARDIOGRAM (TEE);  Surgeon: Lelon Perla, MD;  Location: [facility] ENDOSCOPY;  Service: Cardiovascular;  Laterality: N/A;  . TONSILLECTOMY         Home Medications    Prior to Admission medications   Medication Sig Start Date End Date Taking? Authorizing Provider  ACCU-CHEK AVIVA PLUS test strip USE AS INSTRUCTED TO TEST BLOOD SUGAR 6 TIMES DAILY BEFORE MEALS. DX CODE- E11.65 10/26/16   Elayne Snare, MD  aspirin EC 81 MG EC tablet Take 1 tablet (81 mg total) by mouth daily. 06/17/16   [name], MD  atorvastatin (LIPITOR) 80 MG tablet Take 1 tablet (80 mg total) by mouth daily. 05/25/16   [name], MD  BD INSULIN SYRINGE ULTRAFINE 31G X 15/64" 0.3 ML MISC USE TO IJNECT  INSULIN 3 TIMES DAILY. 08/20/16   Elayne Snare, MD  Blood Glucose Monitoring Suppl (ACCU-CHEK AVIVA PLUS) w/Device KIT USE TO CHECK BLOOD SUGAR 6 TIMES PER DAY DX CODE E11.65 08/05/16   Elayne Snare, MD  docusate sodium (COLACE) 100 MG capsule Take 1 capsule (100 mg total) by mouth daily. 01/26/17   Barton Dubois, MD  gabapentin (NEURONTIN) 300 MG capsule Take 2 capsules (600 mg total) by mouth 2 (two) times daily. 05/25/16   [name], MD  HUMULIN R 500 UNIT/ML injection INJECT 0.56 MILLILITERS INTO V-GO PUMP DAILY, DX CODE E11.65 03/07/17   Elayne Snare, MD  hydrocerin (EUCERIN) CREA Apply 1 application topically 3 (three) times daily. legs 05/25/16   Langeland, Arrie Aran T, MD  Hypromellose (NATURAL BALANCE TEARS OP) Place 1 drop into both eyes daily as needed (for dry eyes).     [provider]  insulin glargine (LANTUS) 100 UNIT/ML injection Inject 0.7 mLs (70 Units total) into the skin 2 (two) times daily. 01/25/17   Barton Dubois, MD  Insulin Pen Needle 31G X 5 MM MISC Use to inject insulin 4 times daily 08/24/16   Elayne Snare, MD  KLOR-CON M20 20 MEQ tablet TAKE 2 TABLETS BY MOUTH TWICE A DAY 02/04/17   Lelon Perla, MD  Lancets (ACCU-CHEK MULTICLIX) lancets Use to test blood sugar 6 times daily- Dx code- E11.65 08/31/16   Elayne Snare, MD  LANTUS 100 UNIT/ML injection INJECT 50 UNITS INTO THE SKIN 2 TIMES DAILY. 02/04/17   Elayne Snare, MD  metolazone (ZAROXOLYN) 5 MG tablet Take '5mg'$  twice weekly on Mondays and Fridays. 09/06/16   Bensimhon, Shaune Pascal, MD  metoprolol tartrate (LOPRESSOR) 50 MG tablet Take 1 tablet (50 mg total) by mouth 2 (two) times daily. 01/27/17   Tresa Garter, MD  Multiple Vitamin (MULTIVITAMIN WITH MINERALS) TABS Take 1 tablet by mouth daily.    [provider]  oxyCODONE (ROXICODONE) 30 MG immediate release tablet Take 1 tablet (30 mg total) by mouth every 4 (four) hours as needed for severe pain. 01/25/17   Barton Dubois, MD  polyethylene glycol  [facility] / Floria Raveling) packet Take 17 g by mouth daily. 01/26/17   Barton Dubois, MD  rivaroxaban (XARELTO) 20 MG TABS tablet TAKE 1 TABLET BY MOUTH EVERY DAY WITH SUPPER 03/04/17   Tresa Garter, MD  spironolactone (ALDACTONE) 25 MG tablet Take 25 mg by mouth daily.    [provider]  torsemide (DEMADEX) 20 MG tablet Take 4 tablets (80 mg total) by mouth 2 (two) times daily. 06/28/16   Almyra Deforest, [place]    Family  History Family History  Problem Relation Age of Onset  . Adopted: Yes  . Other Other        PT ADOPTED    Social History Social History  Substance Use Topics  . Smoking status: Former Smoker    Packs/day: 1.00    Years: 20.00    Types: Cigarettes    Quit date: 07/13/1983  . Smokeless tobacco: Never Used  . Alcohol use No     Allergies   Patient has no known allergies.   Review of Systems Review of Systems  Constitutional: Negative for fever.  Respiratory: Positive for shortness of breath. Negative for sputum production.   Cardiovascular: Negative for chest pain.  All other systems reviewed and are negative.    Physical Exam Updated Vital Signs BP 102/65 (BP Location: Right Arm)   Pulse (!) 129   Temp 98 F (36.7 C) (Oral)   Resp 17   Ht 6' (1.829 m)   Wt (!) 158.8 kg (350 lb)   SpO2 100%   BMI 47.47 kg/m   Physical Exam  Constitutional: He is oriented to person, place, and time. He appears well-developed and well-nourished. No distress.  Patient is a morbidly obese male in no acute distress.  HENT:  Head: Normocephalic and atraumatic.  Mouth/Throat: Oropharynx is clear and moist.  Neck: Normal range of motion. Neck supple.  Cardiovascular: Normal rate and regular rhythm.  Exam reveals no friction rub.   No murmur heard. Pulmonary/Chest: Effort normal and breath sounds normal. No respiratory distress. He has no wheezes. He has no rales.  Abdominal: Soft. Bowel sounds are normal. He exhibits no distension. There is no tenderness.    Musculoskeletal: Normal range of motion. He exhibits edema.  There is chronic venous stasis appearance to both legs along with what appears to be acute on chronic pitting edema.  Neurological: He is alert and oriented to person, place, and time. Coordination normal.  Skin: Skin is warm and dry. He is not diaphoretic.  Nursing note and vitals reviewed.    ED Treatments / Results  Labs (all labs ordered are listed, but only abnormal results are displayed) Labs Reviewed  COMPREHENSIVE METABOLIC PANEL  CBC WITH DIFFERENTIAL/PLATELET  BRAIN NATRIURETIC PEPTIDE  I-STAT TROPONIN, ED    EKG  EKG Interpretation  Date/Time:  Monday March 14 2017 03:14:54 EDT Ventricular Rate:  132 PR Interval:    QRS Duration: 74 QT Interval:  400 QTC Calculation: 593 R Axis:   -51 Text Interpretation:  Atrial fibrillation Left anterior fascicular block Probable anterior infarct, age indeterminate Lateral leads are also involved Prolonged QT interval Baseline wander in lead(s) II III aVF Confirmed by Geoffery Lyons ([id number]) on 03/14/2017 3:23:29 AM       Radiology No results found.  Procedures Procedures (including critical care time)  Medications Ordered in ED Medications  albuterol (PROVENTIL) (2.5 MG/3ML) 0.083% nebulizer solution 5 mg (not administered)  morphine 4 MG/ML injection 4 mg (not administered)     Initial Impression / Assessment and Plan / ED Course  I have reviewed the triage vital signs and the nursing notes.  Pertinent labs & imaging results that were available during my care of the patient were reviewed by me and considered in my medical decision making (see chart for details).  Patient with extensive past medical history presenting with a several day history of weakness, fatigue, and bilateral knee pain. His workup today reveals profound electrolyte abnormalities consistent with diabetic ketoacidosis and acute renal failure. He also  has a markedly elevated white count  and lactic acidosis.  His troponin is elevated and EKG reveals nonspecific EKG changes. This will be discussed with cardiology.  Fluids have been ordered as have antibiotics in case there is an underlying infection causing this. He was also started on the glucose stabilizer and appropriate cultures have been obtained. He will be admitted to the hospitalist service under the care of Dr. Hal Hope. Dr. Domenic Polite from cardiology made aware of the patient's bump in troponin.  Final Clinical Impressions(s) / ED Diagnoses   Final diagnoses:  None    New Prescriptions New Prescriptions   No medications on file     Veryl Speak, MD 03/14/17 0981    Veryl Speak, MD 03/14/17 [phone]

## 2017-03-14 NOTE — ED Triage Notes (Signed)
Per EMS, pt reports SOB and weakness in knees. Pt was found to be in Afib RVR upon EMS arrival. Hx Afib. Pt is unable to ambulate on own without dyspnea. Pt reports bilateral knee pain d/t no cartilage. Pt wears 2L [place] at home. Pt called EMS earlier today for being unable to get off the toilet. CBG 457. Hx Diabetes.

## 2017-03-14 NOTE — ED Notes (Signed)
Dr. Judd Lien notified of critical I stat trop I of 1.47

## 2017-03-15 ENCOUNTER — Inpatient Hospital Stay (HOSPITAL_COMMUNITY): Payer: Medicare Other

## 2017-03-15 DIAGNOSIS — E11621 Type 2 diabetes mellitus with foot ulcer: Secondary | ICD-10-CM

## 2017-03-15 LAB — URINALYSIS, ROUTINE W REFLEX MICROSCOPIC
Bilirubin Urine: NEGATIVE
Glucose, UA: NEGATIVE mg/dL
Hgb urine dipstick: NEGATIVE
Ketones, ur: NEGATIVE mg/dL
LEUKOCYTES UA: NEGATIVE
NITRITE: NEGATIVE
PH: 5 (ref 5.0–8.0)
PROTEIN: NEGATIVE mg/dL
Specific Gravity, Urine: 1.017 (ref 1.005–1.030)

## 2017-03-15 LAB — CBC
HEMATOCRIT: 29.1 % — AB (ref 39.0–52.0)
Hemoglobin: 8.9 g/dL — ABNORMAL LOW (ref 13.0–17.0)
MCH: 22.4 pg — AB (ref 26.0–34.0)
MCHC: 30.6 g/dL (ref 30.0–36.0)
MCV: 73.3 fL — AB (ref 78.0–100.0)
Platelets: 469 10*3/uL — ABNORMAL HIGH (ref 150–400)
RBC: 3.97 MIL/uL — ABNORMAL LOW (ref 4.22–5.81)
RDW: 19.3 % — AB (ref 11.5–15.5)
WBC: 25.8 10*3/uL — AB (ref 4.0–10.5)

## 2017-03-15 LAB — BASIC METABOLIC PANEL
ANION GAP: 12 (ref 5–15)
Anion gap: 14 (ref 5–15)
BUN: 30 mg/dL — AB (ref 6–20)
BUN: 29 mg/dL — ABNORMAL HIGH (ref 6–20)
CALCIUM: 8 mg/dL — AB (ref 8.9–10.3)
CALCIUM: 8.1 mg/dL — AB (ref 8.9–10.3)
CO2: 27 mmol/L (ref 22–32)
CO2: 25 mmol/L (ref 22–32)
CREATININE: 1.81 mg/dL — AB (ref 0.61–1.24)
CREATININE: 1.83 mg/dL — AB (ref 0.61–1.24)
Chloride: 95 mmol/L — ABNORMAL LOW (ref 101–111)
Chloride: 94 mmol/L — ABNORMAL LOW (ref 101–111)
GFR calc Af Amer: 44 mL/min — ABNORMAL LOW (ref >60)
GFR, EST AFRICAN AMERICAN: 43 mL/min — AB (ref >60)
GFR, EST NON AFRICAN AMERICAN: 38 mL/min — AB (ref >60)
GFR, EST NON AFRICAN AMERICAN: 37 mL/min — AB (ref >60)
GLUCOSE: 140 mg/dL — AB (ref 65–99)
Glucose, Bld: 248 mg/dL — ABNORMAL HIGH (ref 65–99)
Potassium: 3.2 mmol/L — ABNORMAL LOW (ref 3.5–5.1)
Potassium: 4.7 mmol/L (ref 3.5–5.1)
SODIUM: 133 mmol/L — AB (ref 135–145)
Sodium: 134 mmol/L — ABNORMAL LOW (ref 135–145)

## 2017-03-15 LAB — GLUCOSE, CAPILLARY
GLUCOSE-CAPILLARY: 145 mg/dL — AB (ref 65–99)
GLUCOSE-CAPILLARY: 172 mg/dL — AB (ref 65–99)
GLUCOSE-CAPILLARY: 173 mg/dL — AB (ref 65–99)
GLUCOSE-CAPILLARY: 180 mg/dL — AB (ref 65–99)
GLUCOSE-CAPILLARY: 182 mg/dL — AB (ref 65–99)
GLUCOSE-CAPILLARY: 188 mg/dL — AB (ref 65–99)
GLUCOSE-CAPILLARY: 193 mg/dL — AB (ref 65–99)
GLUCOSE-CAPILLARY: 231 mg/dL — AB (ref 65–99)
Glucose-Capillary: 121 mg/dL — ABNORMAL HIGH (ref 65–99)
Glucose-Capillary: 133 mg/dL — ABNORMAL HIGH (ref 65–99)
Glucose-Capillary: 140 mg/dL — ABNORMAL HIGH (ref 65–99)
Glucose-Capillary: 153 mg/dL — ABNORMAL HIGH (ref 65–99)
Glucose-Capillary: 191 mg/dL — ABNORMAL HIGH (ref 65–99)
Glucose-Capillary: 194 mg/dL — ABNORMAL HIGH (ref 65–99)
Glucose-Capillary: 205 mg/dL — ABNORMAL HIGH (ref 65–99)
Glucose-Capillary: 220 mg/dL — ABNORMAL HIGH (ref 65–99)
Glucose-Capillary: 235 mg/dL — ABNORMAL HIGH (ref 65–99)
Glucose-Capillary: 304 mg/dL — ABNORMAL HIGH (ref 65–99)

## 2017-03-15 LAB — APTT
aPTT: 56 seconds — ABNORMAL HIGH (ref 24–36)
aPTT: 41 seconds — ABNORMAL HIGH (ref 24–36)

## 2017-03-15 MED ORDER — INSULIN ASPART 100 UNIT/ML ~~LOC~~ SOLN
0.0000 [IU] | SUBCUTANEOUS | Status: DC
  Administered 2017-03-15 (×3): 4 [IU] via SUBCUTANEOUS
  Administered 2017-03-15: 7 [IU] via SUBCUTANEOUS
  Administered 2017-03-15: 4 [IU] via SUBCUTANEOUS
  Administered 2017-03-15 (×2): 7 [IU] via SUBCUTANEOUS
  Administered 2017-03-16: 4 [IU] via SUBCUTANEOUS
  Administered 2017-03-16: 3 [IU] via SUBCUTANEOUS
  Administered 2017-03-16 (×2): 4 [IU] via SUBCUTANEOUS
  Administered 2017-03-16: 3 [IU] via SUBCUTANEOUS
  Administered 2017-03-16 (×2): 4 [IU] via SUBCUTANEOUS

## 2017-03-15 MED ORDER — HEPARIN SODIUM (PORCINE) 5000 UNIT/ML IJ SOLN
5000.0000 [IU] | Freq: Three times a day (TID) | INTRAMUSCULAR | Status: DC
  Administered 2017-03-15 – 2017-03-16 (×3): 5000 [IU] via SUBCUTANEOUS
  Filled 2017-03-15 (×3): qty 1

## 2017-03-15 MED ORDER — METOPROLOL TARTRATE 50 MG PO TABS
50.0000 mg | ORAL_TABLET | Freq: Two times a day (BID) | ORAL | Status: DC
  Administered 2017-03-15 – 2017-03-18 (×7): 50 mg via ORAL
  Filled 2017-03-15 (×9): qty 1

## 2017-03-15 MED ORDER — INSULIN ASPART 100 UNIT/ML ~~LOC~~ SOLN: 0.0000 [IU] | Freq: Three times a day (TID) | SUBCUTANEOUS | Status: DC

## 2017-03-15 MED ORDER — VANCOMYCIN HCL 10 G IV SOLR
1250.0000 mg | Freq: Two times a day (BID) | INTRAVENOUS | Status: DC
  Administered 2017-03-15 – 2017-03-18 (×7): 1250 mg via INTRAVENOUS
  Filled 2017-03-15 (×8): qty 1250

## 2017-03-15 MED ORDER — SODIUM CHLORIDE 0.9 % IV SOLN
INTRAVENOUS | Status: DC
  Administered 2017-03-16: 06:00:00 via INTRAVENOUS

## 2017-03-15 MED ORDER — POTASSIUM CHLORIDE 10 MEQ/100ML IV SOLN
10.0000 meq | INTRAVENOUS | Status: AC
  Administered 2017-03-15 (×3): 10 meq via INTRAVENOUS
  Filled 2017-03-15 (×3): qty 100

## 2017-03-15 MED ORDER — INSULIN GLARGINE 100 UNIT/ML ~~LOC~~ SOLN
25.0000 [IU] | Freq: Two times a day (BID) | SUBCUTANEOUS | Status: DC
  Administered 2017-03-15 – 2017-03-18 (×7): 25 [IU] via SUBCUTANEOUS
  Filled 2017-03-15 (×8): qty 0.25

## 2017-03-15 NOTE — Progress Notes (Signed)
Inpatient Diabetes Program Recommendations  AACE/ADA: New Consensus Statement on Inpatient Glycemic Control (2015)  Target Ranges:  Prepandial:   less than 140 mg/dL      Peak postprandial:   less than 180 mg/dL (1-2 hours)      Critically ill patients:  140 - 180 mg/dL   Lab Results  Component Value Date   GLUCAP 191 (H) 03/15/2017   HGBA1C 8.2 (H) 12/21/2016   Review of Glycemic Control  Diabetes history: DM 2, Patient sees Dr. Lucianne Muss, Endocrinology Outpatient Diabetes medications: Lantus 60 units QAM, 30 units QPM, Novolog 100 units tid Current orders for Inpatient glycemic control: Lantus 25 units BID, Novolog Sensitive Correction 0-9 units tid  Inpatient Diabetes Program Recommendations:    Patient's glucose 190's with a insulin gtt rate at least 10 units an hour. Previous admissions patient was on at least home dose of basal insulin with Resistant Novolog scale and mneal coverage and glucose was still elevated.   For optimal glucose control IV insulin is the best choice for this patient as his glucose levels continued to be high in the past with adjustments inpatient. However, if MD prefers SQ, consider the following:  Lantus 60 units QAM Lantus 30 units QPM Novolog Resistant Correction scale 0-20 tid + NOvolog HS scale 0-5 units Novolog 10 units tid meal coverage if patient consumes at least 50% of meals  This regimen kept patient in the 200's in previous admissions, the lowest we were able to bring him down.  Thanks,  Christena Deem RN, MSN, [facility] Inpatient Diabetes Coordinator Team Pager [phone] (8a-5p)

## 2017-03-15 NOTE — Progress Notes (Signed)
Patient ID: [name], male   DOB: June 21, 1951, 66 y.o.   MRN: [id number] Cellulitis and dermatitis improving in both lower extremities. Right foot shows a large black eschar approximately 6 cm in diameter. The MRI scan is reviewed. The skin over the lateral border of the foot is necrotic. I am most concerned about the lack of soft tissue viability around the foot. MRI scan not concerning for deep abscess or significant osteomyelitis. We will reevaluate tomorrow. Discussed with the patient that he may require a transtibial amputation due to the necrotic soft tissue envelope of the plantar and lateral aspect of the right foot.

## 2017-03-15 NOTE — Progress Notes (Signed)
PROGRESS NOTE    [name]  BTD:[id number] DOB: 06-06-1951 DOA: 03/14/2017 PCP: [name], MD    Brief Narrative:  66 year old male who presented with right knee pain and dyspnea. Patient is known to have obesity, type 2 diabetes mellitus, chronic kidney disease, congestive heart failure, dyslipidemia, hypertension, and paroxysmal atrial fibrillation. Complained of several days of worsening bilateral knee pain, associated with weakness and worsening dyspnea. He presented to the emergency room due to worsening symptoms.  On the initial physical examination, blood pressure 115/60, heart rate 133, respiratory rate 19-21, oxygen saturation 94%. Dry mucous membranes, lungs are clear to auscultation, heart S1-S2 present with regular rhythm, abdomen was obese, non-tender, marked erythema, induration the right lower extremity with black central eschar and surrounding skin necrosis. 3+ bilateral pitting edema at the lower extremities. Sodium 131, potassium 4.6, chloride 89, bicarbonate 16, glucose 454, BUN 30, creatinine 3.0, anion gap 26, troponin 1.0, lactic acid 8.29, white count 35.3, hemoglobin 8.4, hematocrit 28.0, platelets 472, INR 3.2, chest x-ray negative for infiltrates, right foot x-ray with acute on chronic osteomyelitis involving the cuboid bone and fifth metatarsal. Right foot MRI findings consistent with osteomyelitis involving the entire proximal shaft of the fifth metatarsal, the base of the fourth metatarsal and the adjacent cuboid. Septic arthritis at the cuboid articulation with the fourth and fifth metatarsals is also suspected. Complex soft tissue abscess and draining wound along the lateral and plantar aspect of the fifth metatarsal base. Cellulitis and myofasciitis without definite findings for pyomyositis. EKG with SVT, left axis deviation, left anterior fascicular block, poor R-wave progressions.   Patient was admitted to the stepdown unit with a working diagnosis of sepsis  due to right foot gangrene, complete by DKA, SVT, acute kidney injury.     Assessment & Plan:   Active Problems:   Diabetes mellitus type 2, uncontrolled, with complications (HCC)   Hyperlipidemia   Essential hypertension, benign   Atrial fibrillation   Chronic diastolic congestive heart failure (HCC)   CKD (chronic kidney disease) stage 3, GFR 30-59 ml/min   Acute kidney injury (HCC)   Lactic acidosis   Osteoarthritis of both knees   DKA (diabetic ketoacidoses) (HCC)   Elevated troponin   1. Sepsis due to right foot gangrene. Will continue antibiotic therapy with IV vancomycin and zosyn, gentle IV fluids, will follow on cell count, cultures and temperature curve. Orthopedics consulted with recommendations for vascular evaluation, may need surgical intervention due to the necrotic soft tissue. Wbc down from 35 to 25, and venous lactic acid down to 2,1 from 8,29.   2. DKA. Anion gap has closed, will transition to sq insulin, diet will be advanced, patient has received insulin levimir 25 units this am, will place insulin resistant sliding scale. Patient had difficult to control hyperglycemia on prior hospitalization.   3. AKI on CKD. Serum cr trending down from 3 on admission down to 1,81, will replete K with po kcl, serum bicarbonate at 27. Will follow on renal panel in am, avoid hypotension or nephrotoxic medications.   4. AFIB with RVR. Will resume metoprolol 50 mg po bid for rate controlled, will hold on anticoagulation for now, no anticoagulation bridging, indicated at this point in time, will hold on heparin infusion, patient at home on rivaroxaban.    5. T2DM. Difficult to control hyperglycemia, will continue glucose monitoring, will use resistant scale and calculate requirements over the next 24 hours.    6. HTN. Will continue to hold on diuretic therapy,  patient on torsemide and aldactone in the outpatient setting.     DVT prophylaxis: heparin   Code Status: Full  Family  Communication: No family at the bedside  Disposition Plan: Home    Consultants:   Orthopedics  Procedures:     Antimicrobials:   Vancomycin  Zosyn   Subjective: Patient feeling better, no dyspnea or chest pain, no nausea or vomiting, no frank pain on the lower extremities.   Objective: Vitals:   03/14/17 1947 03/14/17 2353 03/15/17 0426 03/15/17 0500  BP: (!) 109/58 108/60    Pulse: (!) 110     Resp: 16 14    Temp:  97.6 F (36.4 C) 98.6 F (37 C)   TempSrc:  Oral Axillary   SpO2: 100%  98%   Weight:    (!) 165.5 kg (364 lb 14.4 oz)  Height:        Intake/Output Summary (Last 24 hours) at 03/15/17 0753 Last data filed at 03/15/17 0700  Gross per 24 hour  Intake          4190.06 ml  Output              500 ml  Net          3690.06 ml   Filed Weights   03/14/17 0319 03/15/17 0500  Weight: (!) 158.8 kg (350 lb) (!) 165.5 kg (364 lb 14.4 oz)    Examination:  General: deconditioned Neurology: Awake and alert, non focal  E ENT: mild pallor, no icterus, oral mucosa moist Cardiovascular: S1-S2 present,irregulary irregular, no gallops, rubs, or murmurs. No jugular venous distention, positive lower extremity edema pitting ++. Pulmonary: decreased breath sounds bilaterally at bases, adequate air movement, no wheezing, rhonchi or rales. Gastrointestinal. Abdomen protuberant, no organomegaly, non tender, no rebound or guarding Skin. Right foot, large ulcerated lesion, unable to stage, dark base, purulence and odor, affecting plantar and lateral region of the foot.  Musculoskeletal: no joint deformities     Data Reviewed: I have personally reviewed following labs and imaging studies  CBC:  Recent Labs Lab 03/14/17 0412  WBC 35.3*  NEUTROABS 31.8*  HGB 8.4*  HCT 28.0*  MCV 74.3*  PLT 472*   Basic Metabolic Panel:  Recent Labs Lab 03/14/17 1055 03/14/17 1138 03/14/17 1441 03/14/17 2007 03/15/17 0344  NA 132* 132* 133* 134* 134*  K 3.5 3.4* 3.4*  3.4* 3.2*  CL 92* 92* 94* 95* 95*  CO2 27 29 27 24 27   GLUCOSE 448* 425* 268* 158* 140*  BUN 33* 32* 32* 32* 30*  CREATININE 2.40* 2.36* 2.23* 2.02* 1.81*  CALCIUM 8.0* 7.9* 8.1* 8.0* 8.0*  MG 1.6*  --   --   --   --   PHOS 3.0  --   --   --   --    GFR: Estimated Creatinine Clearance: 64.9 mL/min (A) (by C-G formula based on SCr of 1.81 mg/dL (H)). Liver Function Tests:  Recent Labs Lab 03/14/17 0412  AST 129*  ALT 38  ALKPHOS 97  BILITOT 1.8*  PROT 7.1  ALBUMIN 2.2*   No results for input(s): LIPASE, AMYLASE in the last 168 hours. No results for input(s): AMMONIA in the last 168 hours. Coagulation Profile:  Recent Labs Lab 03/14/17 1055  INR 3.24   Cardiac Enzymes:  Recent Labs Lab 03/14/17 1055 03/14/17 1441 03/14/17 2007  TROPONINI 1.07* 1.06* 0.85*   BNP (last 3 results) No results for input(s): PROBNP in the last 8760 hours. HbA1C: No results  for input(s): HGBA1C in the last 72 hours. CBG:  Recent Labs Lab 03/15/17 0310 03/15/17 0414 03/15/17 0518 03/15/17 0631 03/15/17 0738  GLUCAP 121* 140* 153* 205* 193*   Lipid Profile: No results for input(s): CHOL, HDL, LDLCALC, TRIG, CHOLHDL, LDLDIRECT in the last 72 hours. Thyroid Function Tests: No results for input(s): TSH, T4TOTAL, FREET4, T3FREE, THYROIDAB in the last 72 hours. Anemia Panel: No results for input(s): VITAMINB12, FOLATE, FERRITIN, TIBC, IRON, RETICCTPCT in the last 72 hours.    Radiology Studies: I have reviewed all of the imaging during this hospital visit personally     Scheduled Meds: . albuterol  5 mg Nebulization Once  . aspirin EC  81 mg Oral Daily  . atorvastatin  80 mg Oral QPM  . gabapentin  600 mg Oral BID  . insulin glargine  25 Units Subcutaneous BID  . potassium chloride SA  40 mEq Oral BID   Continuous Infusions: . sodium chloride 125 mL/hr at 03/14/17 1345  . dextrose 5 % and 0.45% NaCl 75 mL/hr at 03/15/17 0653  . heparin 2,150 Units/hr (03/15/17 0133)    . insulin (NOVOLIN-R) infusion 9.1 mL/hr at 03/15/17 0635  . piperacillin-tazobactam (ZOSYN)  IV 3.375 g (03/15/17 0388)  . potassium chloride 10 mEq (03/15/17 0742)  . [START ON 03/16/2017] vancomycin       LOS: 1 day        Coralie Keens, MD [name] Pager [phone]

## 2017-03-15 NOTE — Progress Notes (Signed)
Pt got 5 bits of V tach, he is alert and oriented denied being in pain @ this time. Dr Delma Officer with IM notified awaiting response.

## 2017-03-15 NOTE — Progress Notes (Signed)
ANTICOAGULATION CONSULT NOTE Pharmacy Consult for heparin Indication: atrial fibrillation  No Known Allergies  Patient Measurements: Height: 6' (182.9 cm) Weight: (!) 350 lb (158.8 kg) IBW/kg (Calculated) : 77.6 Heparin Dosing Weight: 115 kg  Vital Signs: Temp: 97.6 F (36.4 C) (09/03 2353) Temp Source: Oral (09/03 2353) BP: 109/72 (09/03 1355) Pulse Rate: 111 (09/03 1355)  Labs:  Recent Labs  03/14/17 0412 03/14/17 1055 03/14/17 1138 03/14/17 1441 03/14/17 2007 03/14/17 2319  HGB 8.4*  --   --   --   --   --   HCT 28.0*  --   --   --   --   --   PLT 472*  --   --   --   --   --   APTT  --  53*  --  55*  --  56*  LABPROT  --  32.8*  --   --   --   --   INR  --  3.24  --   --   --   --   HEPARINUNFRC  --   --   --  2.14*  --   --   CREATININE 3.08* 2.40* 2.36* 2.23* 2.02*  --   TROPONINI  --  1.07*  --  1.06* 0.85*  --     Estimated Creatinine Clearance: 56.8 mL/min (A) (by C-G formula based on SCr of 2.02 mg/dL (H)).  Assessment: 66 y.o. male with h/o Afib, Xarelto on hold, for heparin  Goal of Therapy:  Heparin level 0.3-0.7 units/ml ; aPTT 66 - 102 s  Monitor platelets by anticoagulation protocol: Yes   Plan:  Increase Heparin  2150 units/hr Follow-up am labs.  Geannie Risen, PharmD, BCPS

## 2017-03-15 NOTE — Progress Notes (Signed)
Patient's pain has been controlled with morphine and oxycodone as pain level has been going up to an 8.  His comfort level has improved slightly but is not better. His HR has been up in the high 130s and has been controlled with Lopressor IV and PO.  At this time his HR is in the LOW 109 to HIGH of 115, Lopressor has been given every 2-3 hours.

## 2017-03-15 NOTE — Progress Notes (Signed)
VASCULAR LAB PRELIMINARY  ARTERIAL  ABI completed: Unable to accurately calculate bilateral ABI's due to non-compressible arteries likely due to medial calcification. Bilateral posterior tibial, and dorsalis pedis waveforms are noted to be biphasic. Right TBI of 0.48 is suggestive of abnormal arterial flow at rest. Unable to obtain left TBI due to inconsistent waveforms.   RIGHT    LEFT    PRESSURE WAVEFORM  PRESSURE WAVEFORM  BRACHIAL 120 Triphasic BRACHIAL 102 Triphasic  DP >254 Biphasic DP >254 Biphasic  PT 180 Biphasic PT >254 Biphasic  GREAT TOE 58 NA GREAT TOE  NA    RIGHT LEFT  ABI      Elsie Stain, RVT 03/15/2017, 10:20 AM

## 2017-03-15 NOTE — Progress Notes (Signed)
Wound noted at beginning of shift. Late entry. Wound assessed at 0300

## 2017-03-15 NOTE — Progress Notes (Signed)
Pharmacy Antibiotic Note  [name] is a 66 y.o. male admitted on 03/14/2017 with sepsis/wound infection Scr much improved  Plan: Zosyn 3.375 gm iv q8h Change Vancomycin to 1250 mg iv Q 12 hours Monitor cx, renal fx, vt prn, adjust doses as needed for improvement  Height: 6' (182.9 cm) Weight: (!) 364 lb 14.4 oz (165.5 kg) IBW/kg (Calculated) : 77.6  Temp (24hrs), Avg:97.9 F (36.6 C), Min:97.5 F (36.4 C), Max:98.6 F (37 C)   Recent Labs Lab 03/14/17 0412 03/14/17 0609 03/14/17 1055 03/14/17 1102 03/14/17 1138 03/14/17 1441 03/14/17 2007 03/15/17 0344  WBC 35.3*  --   --   --   --   --   --   --   CREATININE 3.08*  --  2.40*  --  2.36* 2.23* 2.02* 1.81*  LATICACIDVEN  --  8.29* 2.6* 2.46* 2.1*  --   --   --     Estimated Creatinine Clearance: 64.9 mL/min (A) (by C-G formula based on SCr of 1.81 mg/dL (H)).    No Known Allergies  Thank you Okey Regal, PharmD [phone] 03/15/2017 10:17 AM

## 2017-03-16 DIAGNOSIS — E118 Type 2 diabetes mellitus with unspecified complications: Secondary | ICD-10-CM

## 2017-03-16 DIAGNOSIS — Z794 Long term (current) use of insulin: Secondary | ICD-10-CM

## 2017-03-16 DIAGNOSIS — I4891 Unspecified atrial fibrillation: Secondary | ICD-10-CM

## 2017-03-16 DIAGNOSIS — I10 Essential (primary) hypertension: Secondary | ICD-10-CM

## 2017-03-16 DIAGNOSIS — N183 Chronic kidney disease, stage 3 (moderate): Secondary | ICD-10-CM

## 2017-03-16 DIAGNOSIS — I5032 Chronic diastolic (congestive) heart failure: Secondary | ICD-10-CM

## 2017-03-16 DIAGNOSIS — M86171 Other acute osteomyelitis, right ankle and foot: Secondary | ICD-10-CM

## 2017-03-16 DIAGNOSIS — E1165 Type 2 diabetes mellitus with hyperglycemia: Secondary | ICD-10-CM

## 2017-03-16 LAB — CBC WITH DIFFERENTIAL/PLATELET
BASOS PCT: 0 %
Basophils Absolute: 0 10*3/uL (ref 0.0–0.1)
EOS ABS: 0.2 10*3/uL (ref 0.0–0.7)
Eosinophils Relative: 1 %
HCT: 26.1 % — ABNORMAL LOW (ref 39.0–52.0)
Hemoglobin: 7.7 g/dL — ABNORMAL LOW (ref 13.0–17.0)
Lymphocytes Relative: 10 %
Lymphs Abs: 1.9 10*3/uL (ref 0.7–4.0)
MCH: 21.7 pg — ABNORMAL LOW (ref 26.0–34.0)
MCHC: 29.5 g/dL — ABNORMAL LOW (ref 30.0–36.0)
MCV: 73.5 fL — AB (ref 78.0–100.0)
MONO ABS: 1.4 10*3/uL — AB (ref 0.1–1.0)
Monocytes Relative: 7 %
NEUTROS ABS: 15.8 10*3/uL — AB (ref 1.7–7.7)
Neutrophils Relative %: 82 %
PLATELETS: 392 10*3/uL (ref 150–400)
RBC: 3.55 MIL/uL — ABNORMAL LOW (ref 4.22–5.81)
RDW: 19.2 % — AB (ref 11.5–15.5)
WBC: 19.3 10*3/uL — ABNORMAL HIGH (ref 4.0–10.5)

## 2017-03-16 LAB — GLUCOSE, CAPILLARY
GLUCOSE-CAPILLARY: 165 mg/dL — AB (ref 65–99)
GLUCOSE-CAPILLARY: 154 mg/dL — AB (ref 65–99)
GLUCOSE-CAPILLARY: 143 mg/dL — AB (ref 65–99)
Glucose-Capillary: 182 mg/dL — ABNORMAL HIGH (ref 65–99)
Glucose-Capillary: 172 mg/dL — ABNORMAL HIGH (ref 65–99)
Glucose-Capillary: 138 mg/dL — ABNORMAL HIGH (ref 65–99)
Glucose-Capillary: 132 mg/dL — ABNORMAL HIGH (ref 65–99)
Glucose-Capillary: 140 mg/dL — ABNORMAL HIGH (ref 65–99)

## 2017-03-16 LAB — BASIC METABOLIC PANEL
Anion gap: 8 (ref 5–15)
BUN: 27 mg/dL — AB (ref 6–20)
CALCIUM: 7.8 mg/dL — AB (ref 8.9–10.3)
CO2: 26 mmol/L (ref 22–32)
Chloride: 97 mmol/L — ABNORMAL LOW (ref 101–111)
Creatinine, Ser: 1.59 mg/dL — ABNORMAL HIGH (ref 0.61–1.24)
GFR calc Af Amer: 51 mL/min — ABNORMAL LOW (ref >60)
GFR, EST NON AFRICAN AMERICAN: 44 mL/min — AB (ref >60)
GLUCOSE: 169 mg/dL — AB (ref 65–99)
POTASSIUM: 3.8 mmol/L (ref 3.5–5.1)
Sodium: 131 mmol/L — ABNORMAL LOW (ref 135–145)

## 2017-03-16 LAB — URINE CULTURE: Culture: NO GROWTH

## 2017-03-16 LAB — APTT: aPTT: 39 seconds — ABNORMAL HIGH (ref 24–36)

## 2017-03-16 MED ORDER — DILTIAZEM HCL 60 MG PO TABS
60.0000 mg | ORAL_TABLET | Freq: Four times a day (QID) | ORAL | Status: DC
  Administered 2017-03-16 – 2017-03-18 (×7): 60 mg via ORAL
  Filled 2017-03-16 (×7): qty 1

## 2017-03-16 MED ORDER — INSULIN ASPART 100 UNIT/ML ~~LOC~~ SOLN
0.0000 [IU] | Freq: Three times a day (TID) | SUBCUTANEOUS | Status: DC
  Administered 2017-03-16 (×2): 3 [IU] via SUBCUTANEOUS
  Administered 2017-03-17 (×3): 4 [IU] via SUBCUTANEOUS
  Administered 2017-03-18 (×2): 7 [IU] via SUBCUTANEOUS

## 2017-03-16 MED ORDER — ENOXAPARIN SODIUM 150 MG/ML ~~LOC~~ SOLN
160.0000 mg | Freq: Two times a day (BID) | SUBCUTANEOUS | Status: AC
  Administered 2017-03-16 – 2017-03-17 (×2): 160 mg via SUBCUTANEOUS
  Filled 2017-03-16 (×4): qty 1.07

## 2017-03-16 MED ORDER — DILTIAZEM HCL 60 MG PO TABS
60.0000 mg | ORAL_TABLET | Freq: Three times a day (TID) | ORAL | Status: DC
  Administered 2017-03-16: 60 mg via ORAL
  Filled 2017-03-16: qty 1

## 2017-03-16 MED ORDER — OXYCODONE HCL 5 MG PO TABS
30.0000 mg | ORAL_TABLET | ORAL | Status: DC
  Administered 2017-03-16 – 2017-03-18 (×9): 30 mg via ORAL
  Filled 2017-03-16 (×9): qty 6

## 2017-03-16 MED ORDER — ENOXAPARIN SODIUM 150 MG/ML ~~LOC~~ SOLN: 150.0000 mg | Freq: Two times a day (BID) | SUBCUTANEOUS | Status: DC

## 2017-03-16 MED ORDER — METOPROLOL TARTRATE 5 MG/5ML IV SOLN: 2.5000 mg | INTRAVENOUS | Status: DC | PRN

## 2017-03-16 MED ORDER — DICLOFENAC SODIUM 1 % TD GEL
4.0000 g | Freq: Four times a day (QID) | TRANSDERMAL | Status: DC
  Administered 2017-03-16 – 2017-03-18 (×11): 4 g via TOPICAL
  Filled 2017-03-16: qty 100

## 2017-03-16 NOTE — Progress Notes (Signed)
Patient ID: [name], male   DOB: 1951-03-27, 66 y.o.   MRN: [id number] Patient is seen in follow-up for osteomyelitis abscess necrosis right foot.  Examination the ascending cellulitis has resolved. Patient has a necrotic soft tissue envelope on the plantar aspect of the midfoot as well as the lateral aspect of the hindfoot. Review of the MRI scan shows chronic osteomyelitis involving the midfoot and hindfoot with soft tissue abscess involving the lateral aspect of the foot.  I discussed with the patient his only option would be to proceed with a transtibial amputation. Discussed that his generalized sepsis from the foot infection is improving however patient will need a transtibial amputation to fully recover from this infection. Patient states that he is unsure of proceeding with an amputation. I discussed that this is a situation of life or limb and without amputation of the foot patient is at risk of loss of life. Patient states he is unsure of proceeding with surgery. I will plan on setting him up for surgery on Friday and we'll talk to him again on Thursday.

## 2017-03-16 NOTE — Progress Notes (Signed)
Dr. Butler Denmark made aware of patient's pulse 120s-130s. Pt's BP is 104/87. Ok with MD to administer 2.5mg  IV metoprolol even though parameters say to administer when pulse is greater than 120 AND SBP is greater than 110.

## 2017-03-16 NOTE — Progress Notes (Signed)
ANTICOAGULATION CONSULT NOTE Pharmacy Consult for Lovenox Indication: atrial fibrillation  No Known Allergies  Patient Measurements: Height: 6' (182.9 cm) Weight: (!) 367 lb 9.6 oz (166.7 kg) IBW/kg (Calculated) : 77.6  Vital Signs: Temp: 97.7 F (36.5 C) (09/05 0400) Temp Source: Oral (09/05 0400) BP: 100/64 (09/05 0400) Pulse Rate: 98 (09/05 0400)  Labs:  Recent Labs  03/14/17 0412  03/14/17 1055  03/14/17 1441 03/14/17 2007 03/14/17 2319 03/15/17 0344 03/15/17 1015 03/15/17 1413 03/16/17 0425  HGB 8.4*  --   --   --   --   --   --   --  8.9*  --  7.7*  HCT 28.0*  --   --   --   --   --   --   --  29.1*  --  26.1*  PLT 472*  --   --   --   --   --   --   --  469*  --  392  APTT  --   < > 53*  --  55*  --  56*  --  41*  --  39*  LABPROT  --   --  32.8*  --   --   --   --   --   --   --   --   INR  --   --  3.24  --   --   --   --   --   --   --   --   HEPARINUNFRC  --   --   --   --  2.14*  --   --   --   --   --   --   CREATININE 3.08*  --  2.40*  < > 2.23* 2.02*  --  1.81*  --  1.83* 1.59*  TROPONINI  --   --  1.07*  --  1.06* 0.85*  --   --   --   --   --   < > = values in this interval not displayed.  Estimated Creatinine Clearance: 74.2 mL/min (A) (by C-G formula based on SCr of 1.59 mg/dL (H)).   Medical History: Past Medical History:  Diagnosis Date  . Arthritis    "hands and lower back" (09/19/2014)  . CAD (coronary artery disease)    a. s/p PCI to RCA in 2012. b. prior cath in 01/2014 with elevated L/RH pressures, mild-mod CAD of LAD/LCx with patent RCA, normal EF, c/b CIN/CHF.  [name] Carpal tunnel syndrome, bilateral   . Cellulitis   . Chronic diastolic CHF (congestive heart failure) (HCC)   . Chronic kidney disease (CKD), stage III (moderate)   . Chronic lower back pain   . Diabetes (HCC)   . DKA (diabetic ketoacidoses) (HCC) 03/2017  . History of blood transfusion ~ 1954   "related to OR"  . Hyperlipidemia   . Hypertension   . Hypoxia    a.  Qualified for home O2 at [facility] in 09/2014.  [name] Lower GI bleed   . Microcytic anemia   . Morbid obesity (HCC)   . Neuropathy   . OSA (obstructive sleep apnea)    "I wear nasal prongs; haven't been using prongs recently" (09/19/2014)  . PAF (paroxysmal atrial fibrillation) (HCC)    TEE DCCV 09/23/2014  . Physical deconditioning   . Pilonidal cyst 1980's; 01/25/2013  . Scrotal abscess   . Type II diabetes mellitus (HCC)      Assessment: Xarelto on hold,  transitioned to Lovenox  Goal of Therapy:  Anti-Xa level 0.6-1 units/ml 4hrs after LMWH dose given Monitor platelets by anticoagulation protocol: Yes   Plan:  Lovenox 160 mg sq Q 12 hours Follow up for Xarelto restart  Thank you Okey Regal, PharmD [phone]  03/16/2017,2:25 PM

## 2017-03-16 NOTE — Plan of Care (Signed)
Problem: Pain Managment: Goal: General experience of comfort will improve Outcome: Progressing Pt's pain controlled with ordered pain medication, pt asleep

## 2017-03-16 NOTE — Progress Notes (Addendum)
PROGRESS NOTE    [name]   KWI:[id number]  DOB: 07-17-1950  DOA: 03/14/2017 PCP: [name], MD   Brief Narrative:  [name] is a 66 y/o male with DM2, CKD 3, A-fib, HTN who presents with right leg pain and an ulcer on the planter aspect of the right foot. MRI show abscess and osteomyelitis. Also found to have DKA, SVT, AKI with CR 3.8.  Lactic acid was 8.29.  Troponin max 1.47 WBC 35   Subjective: Has severe pain in both knees which is chronic. Takes Oxycodone 30 mg 6 times a day at home and is not getting it this way here. No other complaints. HR in low 100s- no palpitations or chest pain.  ROS: no complaints of nausea, vomiting, constipation diarrhea, cough, dyspnea or dysuria. No other complaints.   Assessment & Plan:   Principal Problem:   Acute osteomyelitis and necrosis of right foot with abscess in lateral aspect of foot  Leukocytosis, tachycardia, Lactic acidosis >> Severe Sepsis - for transtibial amputation Friday if he agrees - cont Vanc and Zosyn - has diabetic neuropathy in feet which is quite severe- cont Neurontin  Active Problems:     Atrial fibrillation with RVR - may be due to pain and so Oxycodone does increased to home dose, 30 every 4 hrs- have told RN to hold if asleep - checked with RN, he is asleep but HR still > 110 - add Cardizem 60 mg QID - cont Metoprolol 50 BID and PRN IV lopressor - Xarelto on hold right now for OR- will give Lovenox BID   Anemia - likely dilutional- stop IVF today- weight up by 17 lb since admission - anemia panel tomorrow    Osteoarthritis of both knees - Oxycodone as mentioned above    Diabetes mellitus type 2, uncontrolled, with complications   - DKA resolved - Lantus 25 U and SSI - sugars controlled in hospital - Hb A1c 8.2 and has been 8-9 as far back at 10/2015-was 10 in 2016  CAD S/P percutaneous coronary angioplasty Mildly elevated Troponin- flat trend - aspirin, Lipitor    Chronic  diastolic congestive heart failure  - stable      CKD (chronic kidney disease) stage 3, GFR 30-59 ml/min - stable    DVT prophylaxis: Lovenox Code Status: Full code Family Communication:  Disposition Plan: possibly SNF  Consultants:   ortho Procedures:    Antimicrobials:  Anti-infectives    Start     Dose/Rate Route Frequency Ordered Stop   03/16/17 0600  vancomycin (VANCOCIN) 2,000 mg in sodium chloride 0.9 % 500 mL IVPB  Status:  Discontinued     2,000 mg 250 mL/hr over 120 Minutes Intravenous Every 48 hours 03/14/17 0901 03/15/17 1016   03/15/17 1100  vancomycin (VANCOCIN) 1,250 mg in sodium chloride 0.9 % 250 mL IVPB     1,250 mg 166.7 mL/hr over 90 Minutes Intravenous Every 12 hours 03/15/17 1016     03/14/17 1400  piperacillin-tazobactam (ZOSYN) IVPB 3.375 g     3.375 g 12.5 mL/hr over 240 Minutes Intravenous Every 8 hours 03/14/17 0901     03/14/17 0915  vancomycin (VANCOCIN) IVPB 1000 mg/200 mL premix     1,000 mg 200 mL/hr over 60 Minutes Intravenous  Once 03/14/17 0901 03/14/17 1116   03/14/17 0615  vancomycin (VANCOCIN) IVPB 1000 mg/200 mL premix     1,000 mg 200 mL/hr over 60 Minutes Intravenous  Once 03/14/17 0609 03/14/17 2426  03/14/17 0615  piperacillin-tazobactam (ZOSYN) IVPB 3.375 g     3.375 g 12.5 mL/hr over 240 Minutes Intravenous  Once 03/14/17 0609 03/14/17 0844       Objective: Vitals:   03/15/17 1725 03/15/17 1800 03/16/17 0000 03/16/17 0400  BP: 108/80  96/66 100/64  Pulse:  (!) 105 (!) 111 98  Resp:  14 15 15   Temp:   98.5 F (36.9 C) 97.7 F (36.5 C)  TempSrc:   Axillary Oral  SpO2:  96% 99% 98%  Weight:    (!) 166.7 kg (367 lb 9.6 oz)  Height:        Intake/Output Summary (Last 24 hours) at 03/16/17 0830 Last data filed at 03/16/17 0500  Gross per 24 hour  Intake          2398.67 ml  Output             1150 ml  Net          1248.67 ml   Filed Weights   03/14/17 0319 03/15/17 0500 03/16/17 0400  Weight: (!) 158.8 kg (350  lb) (!) 165.5 kg (364 lb 14.4 oz) (!) 166.7 kg (367 lb 9.6 oz)    Examination: General exam: Appears comfortable  HEENT: PERRLA, oral mucosa moist, no sclera icterus or thrush Respiratory system: Clear to auscultation. Respiratory effort normal. Cardiovascular system: S1 & S2 heard, RRR.  No murmurs  Gastrointestinal system: Abdomen soft, non-tender, nondistended. Normal bowel sound. No organomegaly Central nervous system: Alert and oriented. No focal neurological deficits. Extremities: No cyanosis, clubbing or edema Skin: foul smelling black eschar on bottom of right foot Psychiatry:  Restless and in pain    Data Reviewed: I have personally reviewed following labs and imaging studies  CBC:  Recent Labs Lab 03/14/17 0412 03/15/17 1015 03/16/17 0425  WBC 35.3* 25.8* 19.3*  NEUTROABS 31.8*  --  15.8*  HGB 8.4* 8.9* 7.7*  HCT 28.0* 29.1* 26.1*  MCV 74.3* 73.3* 73.5*  PLT 472* 469* 392   Basic Metabolic Panel:  Recent Labs Lab 03/14/17 1055  03/14/17 1441 03/14/17 2007 03/15/17 0344 03/15/17 1413 03/16/17 0425  NA 132*  < > 133* 134* 134* 133* 131*  K 3.5  < > 3.4* 3.4* 3.2* 4.7 3.8  CL 92*  < > 94* 95* 95* 94* 97*  CO2 27  < > 27 24 27 25 26   GLUCOSE 448*  < > 268* 158* 140* 248* 169*  BUN 33*  < > 32* 32* 30* 29* 27*  CREATININE 2.40*  < > 2.23* 2.02* 1.81* 1.83* 1.59*  CALCIUM 8.0*  < > 8.1* 8.0* 8.0* 8.1* 7.8*  MG 1.6*  --   --   --   --   --   --   PHOS 3.0  --   --   --   --   --   --   < > = values in this interval not displayed. GFR: Estimated Creatinine Clearance: 74.2 mL/min (A) (by C-G formula based on SCr of 1.59 mg/dL (H)). Liver Function Tests:  Recent Labs Lab 03/14/17 0412  AST 129*  ALT 38  ALKPHOS 97  BILITOT 1.8*  PROT 7.1  ALBUMIN 2.2*   No results for input(s): LIPASE, AMYLASE in the last 168 hours. No results for input(s): AMMONIA in the last 168 hours. Coagulation Profile:  Recent Labs Lab 03/14/17 1055  INR 3.24   Cardiac  Enzymes:  Recent Labs Lab 03/14/17 1055 03/14/17 1441 03/14/17 2007  TROPONINI  1.07* 1.06* 0.85*   BNP (last 3 results) No results for input(s): PROBNP in the last 8760 hours. HbA1C: No results for input(s): HGBA1C in the last 72 hours. CBG:  Recent Labs Lab 03/15/17 2159 03/15/17 2339 03/16/17 0200 03/16/17 0425 03/16/17 0602  GLUCAP 180* 194* 182* 165* 172*   Lipid Profile: No results for input(s): CHOL, HDL, LDLCALC, TRIG, CHOLHDL, LDLDIRECT in the last 72 hours. Thyroid Function Tests: No results for input(s): TSH, T4TOTAL, FREET4, T3FREE, THYROIDAB in the last 72 hours. Anemia Panel: No results for input(s): VITAMINB12, FOLATE, FERRITIN, TIBC, IRON, RETICCTPCT in the last 72 hours. Urine analysis:    Component Value Date/Time   COLORURINE YELLOW 03/15/2017 1253   APPEARANCEUR HAZY (A) 03/15/2017 1253   LABSPEC 1.017 03/15/2017 1253   PHURINE 5.0 03/15/2017 1253   GLUCOSEU NEGATIVE 03/15/2017 1253   HGBUR NEGATIVE 03/15/2017 1253   BILIRUBINUR NEGATIVE 03/15/2017 1253   KETONESUR NEGATIVE 03/15/2017 1253   PROTEINUR NEGATIVE 03/15/2017 1253   UROBILINOGEN 1.0 11/27/2014 0755   NITRITE NEGATIVE 03/15/2017 1253   LEUKOCYTESUR NEGATIVE 03/15/2017 1253   Sepsis Labs: @LABRCNTIP (procalcitonin:4,lacticidven:4) ) Recent Results (from the past 240 hour(s))  Blood culture (routine x 2)     Status: None (Preliminary result)   Collection Time: 03/14/17  5:20 AM  Result Value Ref Range Status   Specimen Description BLOOD RIGHT ARM  Final   Special Requests   Final    BOTTLES DRAWN AEROBIC AND ANAEROBIC Blood Culture results may not be optimal due to an excessive volume of blood received in culture bottles   Culture NO GROWTH 1 DAY  Final   Report Status PENDING  Incomplete  Blood culture (routine x 2)     Status: None (Preliminary result)   Collection Time: 03/14/17  5:32 AM  Result Value Ref Range Status   Specimen Description BLOOD RIGHT HAND  Final   Special  Requests IN PEDIATRIC BOTTLE Blood Culture adequate volume  Final   Culture NO GROWTH 1 DAY  Final   Report Status PENDING  Incomplete         Radiology Studies: Mr Foot Right Wo Contrast  Result Date: 03/15/2017 CLINICAL DATA:  Draining foot ulcer. EXAM: MRI OF THE RIGHT FOREFOOT WITHOUT CONTRAST TECHNIQUE: Multiplanar, multisequence MR imaging of the right foot was performed. No intravenous contrast was administered. COMPARISON:  Radiographs 03/14/2017 FINDINGS: As demonstrated on the radiographs there is osteomyelitis involving the proximal aspect of the fifth metatarsal. There is also signal abnormality involving the base of the fourth metatarsal and the cuboid with an intervening joint effusion. Findings consistent with osteomyelitis and septic arthritis. Complex fluid collection along the lateral and plantar aspect of the base of fifth metatarsal likely containing gas and consistent with an abscess. Other areas of gas noted in the soft tissues. There is diffuse cellulitis and myofasciitis. No findings for pyomyositis. IMPRESSION: 1. MR findings consistent with osteomyelitis involving the entire proximal shaft of the fifth metatarsal, the base of the fourth metatarsal and the adjacent cuboid. Septic arthritis at the cuboid articulation with the fourth and fifth metatarsals is also suspected. 2. Complex soft tissue abscess and draining wound along the lateral and plantar aspect of the fifth metatarsal base. 3. Cellulitis and myofasciitis without definite findings for pyomyositis. Electronically Signed   By: Rudie Meyer M.D.   On: 03/15/2017 08:02   Dg Foot 2 Views Right  Result Date: 03/14/2017 CLINICAL DATA:  Diabetic right foot ulcers. EXAM: RIGHT FOOT - 2 VIEW COMPARISON:  12/23/2015. FINDINGS: Soft tissue ulceration with gas involving the plantar surface underlying the third, fourth and fifth metatarsals. Osteolysis at the base of the fifth metatarsal which is unchanged from the June, 2017  examination. New osteolysis suspected in the mid shaft of the fifth metatarsal, associated with periosteal new bone. Osteolysis involving the cuboid bone, new since the prior examination Generalized osseous demineralization. No visible acute fractures. Joint space narrowing involving the midfoot. 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 Electronically Signed   By: Hulan Saas M.D.   On: 03/14/2017 08:42      Scheduled Meds: . albuterol  5 mg Nebulization Once  . aspirin EC  81 mg Oral Daily  . atorvastatin  80 mg Oral QPM  . gabapentin  600 mg Oral BID  . heparin subcutaneous  5,000 Units Subcutaneous Q8H  . insulin aspart  0-20 Units Subcutaneous Q2H  . insulin glargine  25 Units Subcutaneous BID  . metoprolol tartrate  50 mg Oral BID   Continuous Infusions: . sodium chloride 50 mL/hr at 03/16/17 0624  . piperacillin-tazobactam (ZOSYN)  IV 3.375 g (03/16/17 0620)  . vancomycin Stopped (03/16/17 0021)     LOS: 2 days    Time spent in minutes: 35    Calvert Cantor, MD [name] Pager: [email or web address] Password [email or web address] 03/16/2017, 8:30 AM

## 2017-03-17 ENCOUNTER — Other Ambulatory Visit (INDEPENDENT_AMBULATORY_CARE_PROVIDER_SITE_OTHER): Payer: Self-pay | Admitting: Family

## 2017-03-17 DIAGNOSIS — M869 Osteomyelitis, unspecified: Secondary | ICD-10-CM

## 2017-03-17 DIAGNOSIS — M170 Bilateral primary osteoarthritis of knee: Secondary | ICD-10-CM

## 2017-03-17 DIAGNOSIS — E785 Hyperlipidemia, unspecified: Secondary | ICD-10-CM

## 2017-03-17 LAB — GLUCOSE, CAPILLARY
Glucose-Capillary: 183 mg/dL — ABNORMAL HIGH (ref 65–99)
Glucose-Capillary: 188 mg/dL — ABNORMAL HIGH (ref 65–99)
Glucose-Capillary: 190 mg/dL — ABNORMAL HIGH (ref 65–99)
Glucose-Capillary: 220 mg/dL — ABNORMAL HIGH (ref 65–99)

## 2017-03-17 LAB — BASIC METABOLIC PANEL
Anion gap: 11 (ref 5–15)
BUN: 24 mg/dL — AB (ref 6–20)
CHLORIDE: 98 mmol/L — AB (ref 101–111)
CO2: 26 mmol/L (ref 22–32)
CREATININE: 1.58 mg/dL — AB (ref 0.61–1.24)
Calcium: 8.1 mg/dL — ABNORMAL LOW (ref 8.9–10.3)
GFR calc Af Amer: 51 mL/min — ABNORMAL LOW (ref >60)
GFR, EST NON AFRICAN AMERICAN: 44 mL/min — AB (ref >60)
GLUCOSE: 172 mg/dL — AB (ref 65–99)
Potassium: 4 mmol/L (ref 3.5–5.1)
SODIUM: 135 mmol/L (ref 135–145)

## 2017-03-17 LAB — CBC
HCT: 26.5 % — ABNORMAL LOW (ref 39.0–52.0)
Hemoglobin: 7.8 g/dL — ABNORMAL LOW (ref 13.0–17.0)
MCH: 21.6 pg — ABNORMAL LOW (ref 26.0–34.0)
MCHC: 29.4 g/dL — AB (ref 30.0–36.0)
MCV: 73.4 fL — AB (ref 78.0–100.0)
PLATELETS: 440 10*3/uL — AB (ref 150–400)
RBC: 3.61 MIL/uL — ABNORMAL LOW (ref 4.22–5.81)
RDW: 19.4 % — AB (ref 11.5–15.5)
WBC: 23.6 10*3/uL — AB (ref 4.0–10.5)

## 2017-03-17 NOTE — Care Management Note (Signed)
Case Management Note Donn Pierini RN, BSN Unit 4E-Case Manager [phone]  Patient Details  Name: [name] MRN: [id number] Date of Birth: 03/27/1951  Subjective/Objective:  Pt admitted with acute osteomyelitis of right foot                  Action/Plan: PTA pt lived at home with sister, CM following for d/c needs-    03/17/17- per Dr. Lajoyce Corners- pt needs transtibial amputation- will tentatively plan for OR on 9/7- if pt agreeable- pt may need rehab post op- will need PT/OT to eval- CM continues to follow.   Expected Discharge Date:                  Expected Discharge Plan:  Skilled Nursing Facility  In-House Referral:  Clinical Social Work  Discharge planning Services  CM Consult  Post Acute Care Choice:    Choice offered to:     DME Arranged:    DME Agency:     HH Arranged:    HH Agency:     Status of Service:  In process, will continue to follow  If discussed at Long Length of Stay Meetings, dates discussed:    Discharge Disposition:   Additional Comments:  Paul Span, RN 03/17/2017, 10:13 AM

## 2017-03-17 NOTE — Progress Notes (Signed)
PROGRESS NOTE    [name]   YJE:[id number]  DOB: 06/15/1951  DOA: 03/14/2017 PCP: [name], MD   Brief Narrative:  [name] is a 66 y/o male with DM2, CKD 3, A-fib, HTN who presents with right leg pain and an ulcer on the planter aspect of the right foot. MRI show abscess and osteomyelitis. Also found to have DKA, SVT, AKI with CR 3.8.  Lactic acid was 8.29.  Troponin max 1.47 WBC 35   Subjective: He was sleeping when I walked in- States that pain in knees has not improved-  ROS: no complaints of nausea, vomiting, constipation diarrhea, cough, dyspnea or dysuria. No other complaints.   Assessment & Plan:   Principal Problem:   Acute osteomyelitis and necrosis of right foot with abscess in lateral aspect of foot  Leukocytosis, tachycardia, Lactic acidosis >> Severe Sepsis - for transtibial amputation Friday   - cont Vanc and Zosyn- WBC noted to be rising which I have told him - has diabetic neuropathy in feet which is quite severe- cont Neurontin  Active Problems:     Atrial fibrillation with RVR - may be due to pain and so Oxycodone does increased to home dose, 30 every 4 hrs- have told RN to hold if asleep - checked with RN, he is asleep but HR still > 110 - added Cardizem 60 mg QID - cont Metoprolol 50 BID  - rate much better controlled now - Xarelto on hold right now for OR- will give Lovenox BID   Anemia - likely dilutional- stop IVF today- weight up by 17 lb since admission - anemia panel tomorrow    Osteoarthritis of both knees - Oxycodone as mentioned above    Diabetes mellitus type 2, uncontrolled, with complications   - DKA resolved - Lantus 25 U and SSI - sugars controlled in hospital - Hb A1c 8.2 and has been 8-9 as far back at 10/2015-was 10 in 2016  CAD S/P percutaneous coronary angioplasty Mildly elevated Troponin- flat trend - aspirin, Lipitor    Chronic diastolic congestive heart failure  - stable- following I and O and  weights      CKD (chronic kidney disease) stage 3, GFR 30-59 ml/min - stable    DVT prophylaxis: Lovenox Code Status: Full code Family Communication:  Disposition Plan: possibly SNF after amputation Consultants:   ortho Procedures:    Antimicrobials:  Anti-infectives    Start     Dose/Rate Route Frequency Ordered Stop   03/16/17 0600  vancomycin (VANCOCIN) 2,000 mg in sodium chloride 0.9 % 500 mL IVPB  Status:  Discontinued     2,000 mg 250 mL/hr over 120 Minutes Intravenous Every 48 hours 03/14/17 0901 03/15/17 1016   03/15/17 1100  vancomycin (VANCOCIN) 1,250 mg in sodium chloride 0.9 % 250 mL IVPB     1,250 mg 166.7 mL/hr over 90 Minutes Intravenous Every 12 hours 03/15/17 1016     03/14/17 1400  piperacillin-tazobactam (ZOSYN) IVPB 3.375 g     3.375 g 12.5 mL/hr over 240 Minutes Intravenous Every 8 hours 03/14/17 0901     03/14/17 0915  vancomycin (VANCOCIN) IVPB 1000 mg/200 mL premix     1,000 mg 200 mL/hr over 60 Minutes Intravenous  Once 03/14/17 0901 03/14/17 1116   03/14/17 0615  vancomycin (VANCOCIN) IVPB 1000 mg/200 mL premix     1,000 mg 200 mL/hr over 60 Minutes Intravenous  Once 03/14/17 0609 03/14/17 0836   03/14/17 0615  piperacillin-tazobactam (ZOSYN)  IVPB 3.375 g     3.375 g 12.5 mL/hr over 240 Minutes Intravenous  Once 03/14/17 0609 03/14/17 0844       Objective: Vitals:   03/17/17 0212 03/17/17 0400 03/17/17 0800 03/17/17 0902  BP: 109/79 115/68  (!) 122/56  Pulse:  99  89  Resp:  15  12  Temp:  98.1 F (36.7 C) 98.4 F (36.9 C) 98.4 F (36.9 C)  TempSrc:  Oral Oral Oral  SpO2:  98%  96%  Weight:  (!) 164.4 kg (362 lb 6.4 oz)    Height:        Intake/Output Summary (Last 24 hours) at 03/17/17 1035 Last data filed at 03/17/17 4827  Gross per 24 hour  Intake              320 ml  Output             1780 ml  Net            -1460 ml   Filed Weights   03/15/17 0500 03/16/17 0400 03/17/17 0400  Weight: (!) 165.5 kg (364 lb 14.4 oz) (!)  166.7 kg (367 lb 9.6 oz) (!) 164.4 kg (362 lb 6.4 oz)    Examination: General exam: Appears comfortable  HEENT: PERRLA, oral mucosa moist, no sclera icterus or thrush Respiratory system: Clear to auscultation. Respiratory effort normal. Cardiovascular system: S1 & S2 heard,  No murmurs - IIRR Gastrointestinal system: Abdomen soft, non-tender, nondistended. Normal bowel sound. No organomegaly Central nervous system: Alert and oriented. No focal neurological deficits. Extremities: No cyanosis, clubbing or edema Skin: fould smelling necrotic ulcer on left foot planter aspect Psychiatry:  Mood & affect appropriate.      Data Reviewed: I have personally reviewed following labs and imaging studies  CBC:  Recent Labs Lab 03/14/17 0412 03/15/17 1015 03/16/17 0425 03/17/17 0312  WBC 35.3* 25.8* 19.3* 23.6*  NEUTROABS 31.8*  --  15.8*  --   HGB 8.4* 8.9* 7.7* 7.8*  HCT 28.0* 29.1* 26.1* 26.5*  MCV 74.3* 73.3* 73.5* 73.4*  PLT 472* 469* 392 440*   Basic Metabolic Panel:  Recent Labs Lab 03/14/17 1055  03/14/17 2007 03/15/17 0344 03/15/17 1413 03/16/17 0425 03/17/17 0312  NA 132*  < > 134* 134* 133* 131* 135  K 3.5  < > 3.4* 3.2* 4.7 3.8 4.0  CL 92*  < > 95* 95* 94* 97* 98*  CO2 27  < > 24 27 25 26 26   GLUCOSE 448*  < > 158* 140* 248* 169* 172*  BUN 33*  < > 32* 30* 29* 27* 24*  CREATININE 2.40*  < > 2.02* 1.81* 1.83* 1.59* 1.58*  CALCIUM 8.0*  < > 8.0* 8.0* 8.1* 7.8* 8.1*  MG 1.6*  --   --   --   --   --   --   PHOS 3.0  --   --   --   --   --   --   < > = values in this interval not displayed. GFR: Estimated Creatinine Clearance: 74 mL/min (A) (by C-G formula based on SCr of 1.58 mg/dL (H)). Liver Function Tests:  Recent Labs Lab 03/14/17 0412  AST 129*  ALT 38  ALKPHOS 97  BILITOT 1.8*  PROT 7.1  ALBUMIN 2.2*   No results for input(s): LIPASE, AMYLASE in the last 168 hours. No results for input(s): AMMONIA in the last 168 hours. Coagulation  Profile:  Recent Labs Lab 03/14/17 1055  INR  3.24   Cardiac Enzymes:  Recent Labs Lab 03/14/17 1055 03/14/17 1441 03/14/17 2007  TROPONINI 1.07* 1.06* 0.85*   BNP (last 3 results) No results for input(s): PROBNP in the last 8760 hours. HbA1C: No results for input(s): HGBA1C in the last 72 hours. CBG:  Recent Labs Lab 03/16/17 1028 03/16/17 1226 03/16/17 1702 03/16/17 2037 03/17/17 0625  GLUCAP 143* 138* 132* 140* 183*   Lipid Profile: No results for input(s): CHOL, HDL, LDLCALC, TRIG, CHOLHDL, LDLDIRECT in the last 72 hours. Thyroid Function Tests: No results for input(s): TSH, T4TOTAL, FREET4, T3FREE, THYROIDAB in the last 72 hours. Anemia Panel: No results for input(s): VITAMINB12, FOLATE, FERRITIN, TIBC, IRON, RETICCTPCT in the last 72 hours. Urine analysis:    Component Value Date/Time   COLORURINE YELLOW 03/15/2017 1253   APPEARANCEUR HAZY (A) 03/15/2017 1253   LABSPEC 1.017 03/15/2017 1253   PHURINE 5.0 03/15/2017 1253   GLUCOSEU NEGATIVE 03/15/2017 1253   HGBUR NEGATIVE 03/15/2017 1253   BILIRUBINUR NEGATIVE 03/15/2017 1253   KETONESUR NEGATIVE 03/15/2017 1253   PROTEINUR NEGATIVE 03/15/2017 1253   UROBILINOGEN 1.0 11/27/2014 0755   NITRITE NEGATIVE 03/15/2017 1253   LEUKOCYTESUR NEGATIVE 03/15/2017 1253   Sepsis Labs: @LABRCNTIP (procalcitonin:4,lacticidven:4) ) Recent Results (from the past 240 hour(s))  Blood culture (routine x 2)     Status: None (Preliminary result)   Collection Time: 03/14/17  5:20 AM  Result Value Ref Range Status   Specimen Description BLOOD RIGHT ARM  Final   Special Requests   Final    BOTTLES DRAWN AEROBIC AND ANAEROBIC Blood Culture results may not be optimal due to an excessive volume of blood received in culture bottles   Culture NO GROWTH 3 DAYS  Final   Report Status PENDING  Incomplete  Blood culture (routine x 2)     Status: None (Preliminary result)   Collection Time: 03/14/17  5:32 AM  Result Value Ref Range  Status   Specimen Description BLOOD RIGHT HAND  Final   Special Requests IN PEDIATRIC BOTTLE Blood Culture adequate volume  Final   Culture NO GROWTH 3 DAYS  Final   Report Status PENDING  Incomplete  Urine culture     Status: None   Collection Time: 03/15/17 12:53 PM  Result Value Ref Range Status   Specimen Description URINE, CLEAN CATCH  Final   Special Requests NONE  Final   Culture NO GROWTH  Final   Report Status 03/16/2017 FINAL  Final         Radiology Studies: No results found.    Scheduled Meds: . albuterol  5 mg Nebulization Once  . aspirin EC  81 mg Oral Daily  . atorvastatin  80 mg Oral QPM  . diclofenac sodium  4 g Topical QID  . diltiazem  60 mg Oral Q6H  . enoxaparin (LOVENOX) injection  160 mg Subcutaneous Q12H  . gabapentin  600 mg Oral BID  . insulin aspart  0-20 Units Subcutaneous TID WC  . insulin glargine  25 Units Subcutaneous BID  . metoprolol tartrate  50 mg Oral BID  . oxycodone  30 mg Oral Q4H   Continuous Infusions: . piperacillin-tazobactam (ZOSYN)  IV Stopped (03/17/17 0911)  . vancomycin Stopped (03/17/17 0041)     LOS: 3 days    Time spent in minutes: 35    05/17/17, MD [name] Pager: [email or web address] Password [email or web address] 03/17/2017, 10:35 AM

## 2017-03-17 NOTE — Progress Notes (Signed)
Patient ID: [name], male   DOB: 05-12-1951, 65 y.o.   MRN: [id number] Patient is still requiring around-the-clock morphine. Again recommended proceeding with the transtibial amputation. Patient states he understands he wants to talk with his sister today I discussed that we will place the patient nothing by mouth after midnight and we will reserve a place for surgery tomorrow for a transtibial amputation on the right.

## 2017-03-18 ENCOUNTER — Other Ambulatory Visit: Payer: Self-pay

## 2017-03-18 ENCOUNTER — Encounter (HOSPITAL_COMMUNITY)
Admission: EM | Disposition: A | Discharge status: Skilled Nursing Facility | Payer: Self-pay | Source: Home / Self Care | Attending: Internal Medicine

## 2017-03-18 DIAGNOSIS — E11621 Type 2 diabetes mellitus with foot ulcer: Secondary | ICD-10-CM | POA: Diagnosis not present

## 2017-03-18 DIAGNOSIS — M86171 Other acute osteomyelitis, right ankle and foot: Secondary | ICD-10-CM

## 2017-03-18 DIAGNOSIS — M869 Osteomyelitis, unspecified: Secondary | ICD-10-CM | POA: Diagnosis not present

## 2017-03-18 DIAGNOSIS — I5033 Acute on chronic diastolic (congestive) heart failure: Secondary | ICD-10-CM | POA: Diagnosis not present

## 2017-03-18 DIAGNOSIS — L97414 Non-pressure chronic ulcer of right heel and midfoot with necrosis of bone: Secondary | ICD-10-CM | POA: Diagnosis not present

## 2017-03-18 DIAGNOSIS — M86271 Subacute osteomyelitis, right ankle and foot: Secondary | ICD-10-CM | POA: Diagnosis not present

## 2017-03-18 DIAGNOSIS — L89150 Pressure ulcer of sacral region, unstageable: Secondary | ICD-10-CM | POA: Diagnosis not present

## 2017-03-18 DIAGNOSIS — Z955 Presence of coronary angioplasty implant and graft: Secondary | ICD-10-CM | POA: Diagnosis not present

## 2017-03-18 DIAGNOSIS — I70234 Atherosclerosis of native arteries of right leg with ulceration of heel and midfoot: Secondary | ICD-10-CM | POA: Diagnosis not present

## 2017-03-18 DIAGNOSIS — M86671 Other chronic osteomyelitis, right ankle and foot: Secondary | ICD-10-CM | POA: Diagnosis not present

## 2017-03-18 DIAGNOSIS — M86172 Other acute osteomyelitis, left ankle and foot: Secondary | ICD-10-CM

## 2017-03-18 DIAGNOSIS — Z9841 Cataract extraction status, right eye: Secondary | ICD-10-CM | POA: Diagnosis not present

## 2017-03-18 DIAGNOSIS — E785 Hyperlipidemia, unspecified: Secondary | ICD-10-CM | POA: Diagnosis not present

## 2017-03-18 DIAGNOSIS — L02611 Cutaneous abscess of right foot: Secondary | ICD-10-CM | POA: Diagnosis not present

## 2017-03-18 DIAGNOSIS — E1140 Type 2 diabetes mellitus with diabetic neuropathy, unspecified: Secondary | ICD-10-CM | POA: Diagnosis not present

## 2017-03-18 DIAGNOSIS — E1122 Type 2 diabetes mellitus with diabetic chronic kidney disease: Secondary | ICD-10-CM | POA: Diagnosis not present

## 2017-03-18 DIAGNOSIS — R2689 Other abnormalities of gait and mobility: Secondary | ICD-10-CM | POA: Diagnosis not present

## 2017-03-18 DIAGNOSIS — I509 Heart failure, unspecified: Secondary | ICD-10-CM | POA: Diagnosis not present

## 2017-03-18 DIAGNOSIS — L97519 Non-pressure chronic ulcer of other part of right foot with unspecified severity: Secondary | ICD-10-CM | POA: Diagnosis not present

## 2017-03-18 DIAGNOSIS — N183 Chronic kidney disease, stage 3 (moderate): Secondary | ICD-10-CM | POA: Diagnosis not present

## 2017-03-18 DIAGNOSIS — L03115 Cellulitis of right lower limb: Secondary | ICD-10-CM | POA: Diagnosis not present

## 2017-03-18 DIAGNOSIS — I480 Paroxysmal atrial fibrillation: Secondary | ICD-10-CM | POA: Diagnosis not present

## 2017-03-18 DIAGNOSIS — L89153 Pressure ulcer of sacral region, stage 3: Secondary | ICD-10-CM | POA: Diagnosis not present

## 2017-03-18 DIAGNOSIS — Z961 Presence of intraocular lens: Secondary | ICD-10-CM | POA: Diagnosis not present

## 2017-03-18 DIAGNOSIS — I10 Essential (primary) hypertension: Secondary | ICD-10-CM | POA: Diagnosis not present

## 2017-03-18 DIAGNOSIS — I4891 Unspecified atrial fibrillation: Secondary | ICD-10-CM | POA: Diagnosis not present

## 2017-03-18 DIAGNOSIS — I5032 Chronic diastolic (congestive) heart failure: Secondary | ICD-10-CM | POA: Diagnosis not present

## 2017-03-18 DIAGNOSIS — Z79899 Other long term (current) drug therapy: Secondary | ICD-10-CM | POA: Diagnosis not present

## 2017-03-18 DIAGNOSIS — Z87891 Personal history of nicotine dependence: Secondary | ICD-10-CM | POA: Diagnosis not present

## 2017-03-18 DIAGNOSIS — I2510 Atherosclerotic heart disease of native coronary artery without angina pectoris: Secondary | ICD-10-CM | POA: Diagnosis not present

## 2017-03-18 DIAGNOSIS — Z6841 Body Mass Index (BMI) 40.0 and over, adult: Secondary | ICD-10-CM | POA: Diagnosis not present

## 2017-03-18 DIAGNOSIS — Z9861 Coronary angioplasty status: Secondary | ICD-10-CM

## 2017-03-18 DIAGNOSIS — E1169 Type 2 diabetes mellitus with other specified complication: Secondary | ICD-10-CM | POA: Diagnosis not present

## 2017-03-18 DIAGNOSIS — Z9842 Cataract extraction status, left eye: Secondary | ICD-10-CM | POA: Diagnosis not present

## 2017-03-18 DIAGNOSIS — G4733 Obstructive sleep apnea (adult) (pediatric): Secondary | ICD-10-CM | POA: Diagnosis not present

## 2017-03-18 DIAGNOSIS — L97411 Non-pressure chronic ulcer of right heel and midfoot limited to breakdown of skin: Secondary | ICD-10-CM | POA: Diagnosis not present

## 2017-03-18 DIAGNOSIS — M79671 Pain in right foot: Secondary | ICD-10-CM | POA: Diagnosis present

## 2017-03-18 DIAGNOSIS — I130 Hypertensive heart and chronic kidney disease with heart failure and stage 1 through stage 4 chronic kidney disease, or unspecified chronic kidney disease: Secondary | ICD-10-CM | POA: Diagnosis not present

## 2017-03-18 DIAGNOSIS — E0840 Diabetes mellitus due to underlying condition with diabetic neuropathy, unspecified: Secondary | ICD-10-CM | POA: Diagnosis not present

## 2017-03-18 DIAGNOSIS — I878 Other specified disorders of veins: Secondary | ICD-10-CM | POA: Diagnosis not present

## 2017-03-18 LAB — GLUCOSE, CAPILLARY
GLUCOSE-CAPILLARY: 217 mg/dL — AB (ref 65–99)
Glucose-Capillary: 222 mg/dL — ABNORMAL HIGH (ref 65–99)

## 2017-03-18 LAB — IRON AND TIBC
Iron: 14 ug/dL — ABNORMAL LOW (ref 45–182)
SATURATION RATIOS: 6 % — AB (ref 17.9–39.5)
TIBC: 217 ug/dL — ABNORMAL LOW (ref 250–450)
UIBC: 203 ug/dL

## 2017-03-18 LAB — FOLATE: FOLATE: 20.6 ng/mL (ref >5.9)

## 2017-03-18 LAB — FERRITIN: Ferritin: 169 ng/mL (ref 24–336)

## 2017-03-18 LAB — RETICULOCYTES
RBC.: 3.7 MIL/uL — AB (ref 4.22–5.81)
RETIC CT PCT: 2.2 % (ref 0.4–3.1)
Retic Count, Absolute: 81.4 10*3/uL (ref 19.0–186.0)

## 2017-03-18 SURGERY — AMPUTATION BELOW KNEE
Anesthesia: Choice | Laterality: Right

## 2017-03-18 MED ORDER — OXYCODONE HCL 30 MG PO TABS: 30.0000 mg | ORAL_TABLET | ORAL | 0 refills | Status: DC | PRN

## 2017-03-18 MED ORDER — DILTIAZEM HCL ER COATED BEADS 240 MG PO CP24
240.0000 mg | ORAL_CAPSULE | Freq: Every day | ORAL | Status: DC
  Administered 2017-03-18: 240 mg via ORAL
  Filled 2017-03-18: qty 1

## 2017-03-18 MED ORDER — DILTIAZEM HCL ER COATED BEADS 240 MG PO CP24: 240.0000 mg | ORAL_CAPSULE | Freq: Every day | ORAL | Status: DC

## 2017-03-18 MED ORDER — AMOXICILLIN-POT CLAVULANATE 875-125 MG PO TABS: 1.0000 | ORAL_TABLET | Freq: Two times a day (BID) | ORAL | 0 refills | Status: DC

## 2017-03-18 MED ORDER — DOXYCYCLINE HYCLATE 50 MG PO CAPS: 100.0000 mg | ORAL_CAPSULE | Freq: Two times a day (BID) | ORAL | 0 refills | Status: DC

## 2017-03-18 NOTE — Care Management Note (Signed)
Case Management Note Donn Pierini RN, BSN Unit 4E-Case Manager [phone]  Patient Details  Name: [name] MRN: [id number] Date of Birth: Aug 12, 1950  Subjective/Objective:  Pt admitted with acute osteomyelitis of right foot                  Action/Plan: PTA pt lived at home with sister, CM following for d/c needs-    03/17/17- per Dr. Lajoyce Corners- pt needs transtibial amputation- will tentatively plan for OR on 9/7- if pt agreeable- pt may need rehab post op- will need PT/OT to eval- CM continues to follow.   Expected Discharge Date:  03/18/17               Expected Discharge Plan:  Skilled Nursing Facility  In-House Referral:  Clinical Social Work  Discharge planning Services  CM Consult  Post Acute Care Choice:    Choice offered to:     DME Arranged:    DME Agency:     HH Arranged:    HH Agency:     Status of Service:  Completed, signed off  If discussed at [employer], dates discussed:    Discharge Disposition: skilled facility   Additional Comments:  03/18/17- 1230- Robet Crutchfield RN, CM- pt has refused surgery today- plan will be to d/c to SNF- CSW working on placement- and pt will return for amputation when agreeable- possibly next week with Dr. Lajoyce Corners.   03/17/17- 1630- Miana Politte RN, CM- spoke with pt and sister at the bedside regarding possible d/c plans post op if pt agreeable to amputation tomorrow with Dr. Lajoyce Corners- sister is aware of severity of situation and encouraging pt to go through with surgery for amputation as infection is life threatening. Discussed that pt lives with sister in a trailer- has difficulty getting around- sister feels like pt will need rehab post op- states he has been in a SNF but can not remember name- requesting to speak with CSW also. Sister also looking ahead that pt may end up needing ALF vs SNF long term- already as Medicaid. CM and CSW will f/u with pt and sister post op for d/c needs.   Darrold Span,  RN 03/18/2017, 2:08 PM

## 2017-03-18 NOTE — Consult Note (Signed)
   [facility] CM Inpatient Consult   03/18/2017  NOAM KARAFFA [birth year] [id number]   Patient assessed for [facility] in the [facility].  Patient assessed for uncontrolled diabetes now with osteomyelitis Abscess of the right foot. Chart review reveals that the patient is currently to discharge to a skilled facility and possibly have surgery on next Wednesday due to the patient refusing his surgery today for a right below the knee amputation. Met with the patient at the bedside regarding [facility] follow up.  He states he sees a doctor at [facility] but didn't remember the name. Explained [facility] for post hospital follow up.  He states he will "look over the information" [brochure].  A brochure with contact information given regarding [facility].  Patient to discharge to [facility] and return for surgery.  For questions, please contact:  Natividad Brood, RN BSN [facility] Liaison  [phone] business mobile phone Toll free office [phone]

## 2017-03-18 NOTE — Progress Notes (Signed)
Clinical Social Worker facilitated patient discharge including contacting patient family and facility to confirm patient discharge plans.  Clinical information faxed to facility and family agreeable with plan.  CSW arranged ambulance transport via [facility] to [facility] .  RN to call [phone] for report prior to discharge.  Clinical Social Worker will sign off for now as social work intervention is no longer needed. Please consult us again if new need arises.  Ahron Hulbert, MSW, LCSWA [phone]  

## 2017-03-18 NOTE — Discharge Summary (Signed)
Physician Discharge Summary  [name] XIP:[id number] DOB: 1951/02/20 DOA: 03/14/2017  PCP: [name], MD  Admit date: 03/14/2017 Discharge date: 03/18/2017  Admitted From: home Disposition:  SNF   Recommendations for Outpatient Follow-up:  1. He has been postponing surgery daily by stating that he needs to discuss it with his family. His sister states she is encouraging him to have the amputation but he will not listen to her. She has called him 3 times today and now he is just hanging up the phone on her. She has been taking care of him for years and states he is non-compliant at home. He drinks 2 2 liter regular Pepsi's a day.  2. I have spoken with Dr Sharol Given, the next opening he has is Wednesday. The patient is being discharged with antibiotics and he can decide if he wants the surgery on Wednesday.  3. If he agree's to surgery on Wednesday, Xarelto needs to be held 3 days prior  Discharge Condition:  stable   CODE STATUS:  Full code   Consultations:  Ortho    Discharge Diagnoses:  Principal Problem:   Acute osteomyelitis of right foot ([place]) Active Problems:   Diabetes mellitus type 2, uncontrolled, with complications ([place])   Hyperlipidemia   Essential hypertension, benign   Atrial fibrillation   CAD S/P percutaneous coronary angioplasty   Chronic diastolic congestive heart failure (HCC)   Chronic anticoagulation- Coumadin   CKD (chronic kidney disease) stage 3, GFR 30-59 ml/min   Atrial fibrillation with RVR (HCC)   Acute kidney injury ([place])   Lactic acidosis   Osteoarthritis of both knees   DKA (diabetic ketoacidoses) (HCC)   Elevated troponin    Subjective: No new complaints. Appears quite comfortable and is eating breakfast.   Brief Summary: [name] is a 66 y/o male with DM2, CKD 3, A-fib, HTN who presents with right leg pain and an ulcer on the planter aspect of the right foot. MRI show abscess and osteomyelitis. Also found to have DKA, SVT, AKI  with CR 3.8.  Lactic acid was 8.29.  Troponin max 1.47 WBC 35  Hospital Course:  Principal Problem:   Acute osteomyelitis and necrosis of right foot with abscess in lateral aspect of foot  Leukocytosis, tachycardia, Lactic acidosis >> Severe Sepsis - Dr Sharol Given has recommended a transtibial amputation but he is postponing it daily - he was told that he would be placed on the OR schedule for today and he is again refusing- Dr Sharol Given has told his that this is against medical advice - has been on Vanc and Zosyn- WBC noted to be rising which I have told him- will switch to Doxy and Augmentin - has diabetic neuropathy in feet which is quite severe- cont Neurontin   Active Problems:  Atrial fibrillation with RVR - may be due to pain and so Oxycodone does increased to home dose, 30 every 4 hrs- have told RN to hold if asleep - checked with RN, he is asleep but HR still > 110 - added Cardizem 60 mg QID - cont Metoprolol 50 BID  - rate much better controlled now - Xarelto was on hold right  for OR - will resume  Anemia - likely dilutional- stop IVF today- weight up by 17 lb since admission - anemia panel tomorrow    Osteoarthritis of both knees - Oxycodone as mentioned above    Diabetes mellitus type 2, uncontrolled, with complications   - DKA resolved - Lantus 25  U and SSI - sugars controlled in hospital - Hb A1c 8.2 and has been 8-9 as far back at 10/2015-was 10 in 2016  CAD S/P percutaneous coronary angioplasty Mildly elevated Troponin- flat trend - aspirin, Lipitor    Chronic diastolic congestive heart failure  - stable- following I and O and weights      CKD (chronic kidney disease) stage 3, GFR 30-59 ml/min - stable   Body mass index is 49.61 kg/m.    Discharge Instructions  Discharge Instructions    Diet - low sodium heart healthy    Complete by:  As directed    Diet Carb Modified    Complete by:  As directed    Increase activity slowly    Complete by:  As  directed      Allergies as of 03/18/2017   No Known Allergies     Medication List    TAKE these medications   ACCU-CHEK AVIVA PLUS test strip Generic drug:  glucose blood USE AS INSTRUCTED TO TEST BLOOD SUGAR 6 TIMES DAILY BEFORE MEALS. DX CODE- E11.65   ACCU-CHEK AVIVA PLUS w/Device Kit USE TO CHECK BLOOD SUGAR 6 TIMES PER DAY DX CODE E11.65   accu-chek multiclix lancets Use to test blood sugar 6 times daily- Dx code- E11.65   amoxicillin-clavulanate 875-125 MG tablet Commonly known as:  AUGMENTIN Take 1 tablet by mouth every 12 (twelve) hours.   aspirin 81 MG EC tablet Take 1 tablet (81 mg total) by mouth daily.   atorvastatin 80 MG tablet Commonly known as:  LIPITOR Take 1 tablet (80 mg total) by mouth daily.   BD INSULIN SYRINGE ULTRAFINE 31G X 15/64" 0.3 ML Misc Generic drug:  Insulin Syringe-Needle U-100 USE TO IJNECT INSULIN 3 TIMES DAILY.   diltiazem 240 MG 24 hr capsule Commonly known as:  CARDIZEM CD Take 1 capsule (240 mg total) by mouth daily.   docusate sodium 100 MG capsule Commonly known as:  COLACE Take 1 capsule (100 mg total) by mouth daily.   doxycycline 50 MG capsule Commonly known as:  VIBRAMYCIN Take 2 capsules (100 mg total) by mouth 2 (two) times daily.   gabapentin 300 MG capsule Commonly known as:  NEURONTIN Take 2 capsules (600 mg total) by mouth 2 (two) times daily.   hydrocerin Crea Apply 1 application topically 3 (three) times daily. legs   insulin aspart 100 UNIT/ML injection Commonly known as:  novoLOG Inject 100 Units into the skin 3 (three) times daily before meals.   Insulin Pen Needle 31G X 5 MM Misc Use to inject insulin 4 times daily   KLOR-CON M20 20 MEQ tablet Generic drug:  potassium chloride SA TAKE 2 TABLETS BY MOUTH TWICE A DAY   LANTUS 100 UNIT/ML injection Generic drug:  insulin glargine INJECT 50 UNITS INTO THE SKIN 2 TIMES DAILY. What changed:  See the new instructions.   metolazone 5 MG  tablet Commonly known as:  ZAROXOLYN Take 37m twice weekly on Mondays and Fridays.   metoprolol tartrate 50 MG tablet Commonly known as:  LOPRESSOR Take 1 tablet (50 mg total) by mouth 2 (two) times daily.   multivitamin with minerals Tabs tablet Take 1 tablet by mouth daily.   NATURAL BALANCE TEARS OP Place 1 drop into both eyes daily as needed (for dry eyes).   oxycodone 30 MG immediate release tablet Commonly known as:  ROXICODONE Take 1 tablet (30 mg total) by mouth every 4 (four) hours as needed for severe pain.  polyethylene glycol packet Commonly known as:  MIRALAX / GLYCOLAX Take 17 g by mouth daily.   rivaroxaban 20 MG Tabs tablet Commonly known as:  XARELTO TAKE 1 TABLET BY MOUTH EVERY DAY WITH SUPPER   spironolactone 25 MG tablet Commonly known as:  ALDACTONE Take 25 mg by mouth daily.   torsemide 20 MG tablet Commonly known as:  DEMADEX Take 4 tablets (80 mg total) by mouth 2 (two) times daily.            Discharge Care Instructions        Start     Ordered   03/18/17 0000  amoxicillin-clavulanate (AUGMENTIN) 875-125 MG tablet  Every 12 hours     03/18/17 1237   03/18/17 0000  doxycycline (VIBRAMYCIN) 50 MG capsule  2 times daily     03/18/17 1237   03/18/17 0000  Increase activity slowly     03/18/17 1237   03/18/17 0000  Diet - low sodium heart healthy     03/18/17 1237   03/18/17 0000  Diet Carb Modified     03/18/17 1237   03/18/17 0000  diltiazem (CARDIZEM CD) 240 MG 24 hr capsule  Daily     03/18/17 1240      No Known Allergies   Procedures/Studies:    Dg Chest 2 View  Result Date: 03/14/2017 CLINICAL DATA:  Dyspnea and weakness tonight. EXAM: CHEST  2 VIEW COMPARISON:  01/22/2017 FINDINGS: Unchanged moderate cardiomegaly. No airspace consolidation. Mild vascular and interstitial prominence. No pleural effusions. IMPRESSION: Cardiomegaly and mild vascular/ interstitial prominence. No alveolar edema. No effusions. Electronically  Signed   By: Andreas Newport M.D.   On: 03/14/2017 04:02   Mr Foot Right Wo Contrast  Result Date: 03/15/2017 CLINICAL DATA:  Draining foot ulcer. EXAM: MRI OF THE RIGHT FOREFOOT WITHOUT CONTRAST TECHNIQUE: Multiplanar, multisequence MR imaging of the right foot was performed. No intravenous contrast was administered. COMPARISON:  Radiographs 03/14/2017 FINDINGS: As demonstrated on the radiographs there is osteomyelitis involving the proximal aspect of the fifth metatarsal. There is also signal abnormality involving the base of the fourth metatarsal and the cuboid with an intervening joint effusion. Findings consistent with osteomyelitis and septic arthritis. Complex fluid collection along the lateral and plantar aspect of the base of fifth metatarsal likely containing gas and consistent with an abscess. Other areas of gas noted in the soft tissues. There is diffuse cellulitis and myofasciitis. No findings for pyomyositis. IMPRESSION: 1. MR findings consistent with osteomyelitis involving the entire proximal shaft of the fifth metatarsal, the base of the fourth metatarsal and the adjacent cuboid. Septic arthritis at the cuboid articulation with the fourth and fifth metatarsals is also suspected. 2. Complex soft tissue abscess and draining wound along the lateral and plantar aspect of the fifth metatarsal base. 3. Cellulitis and myofasciitis without definite findings for pyomyositis. Electronically Signed   By: Marijo Sanes M.D.   On: 03/15/2017 08:02   Dg Foot 2 Views Right  Result Date: 03/14/2017 CLINICAL DATA:  Diabetic right foot ulcers. EXAM: RIGHT FOOT - 2 VIEW COMPARISON:  12/23/2015. FINDINGS: Soft tissue ulceration with gas involving the plantar surface underlying the third, fourth and fifth metatarsals. Osteolysis at the base of the fifth metatarsal which is unchanged from the June, 2017 examination. New osteolysis suspected in the mid shaft of the fifth metatarsal, associated with periosteal new  bone. Osteolysis involving the cuboid bone, new since the prior examination Generalized osseous demineralization. No visible acute fractures. Joint space narrowing  involving the midfoot. 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 Electronically Signed   By: Evangeline Dakin M.D.   On: 03/14/2017 08:42       Discharge Exam: Vitals:   03/18/17 0754 03/18/17 0820  BP: 122/61 121/61  Pulse:  77  Resp:    Temp: 98.5 F (36.9 C)   SpO2:     Vitals:   03/18/17 0000 03/18/17 0439 03/18/17 0754 03/18/17 0820  BP: (!) 97/56  122/61 121/61  Pulse: 74   77  Resp: 16     Temp:  98.5 F (36.9 C) 98.5 F (36.9 C)   TempSrc:  Axillary Oral   SpO2: 93%     Weight:  (!) 165.9 kg (365 lb 12.8 oz)    Height:        General: Pt is alert, awake, not in acute distress Cardiovascular: RRR, S1/S2 +, no rubs, no gallops Respiratory: CTA bilaterally, no wheezing, no rhonchi Abdominal: Soft, NT, ND, bowel sounds + Extremities: no edema, no cyanosis    The results of significant diagnostics from this hospitalization (including imaging, microbiology, ancillary and laboratory) are listed below for reference.     Microbiology: Recent Results (from the past 240 hour(s))  Blood culture (routine x 2)     Status: None (Preliminary result)   Collection Time: 03/14/17  5:20 AM  Result Value Ref Range Status   Specimen Description BLOOD RIGHT ARM  Final   Special Requests   Final    BOTTLES DRAWN AEROBIC AND ANAEROBIC Blood Culture results may not be optimal due to an excessive volume of blood received in culture bottles   Culture NO GROWTH 3 DAYS  Final   Report Status PENDING  Incomplete  Blood culture (routine x 2)     Status: None (Preliminary result)   Collection Time: 03/14/17  5:32 AM  Result Value Ref Range Status   Specimen Description BLOOD RIGHT HAND  Final   Special Requests IN PEDIATRIC BOTTLE Blood Culture adequate  volume  Final   Culture NO GROWTH 3 DAYS  Final   Report Status PENDING  Incomplete  Urine culture     Status: None   Collection Time: 03/15/17 12:53 PM  Result Value Ref Range Status   Specimen Description URINE, CLEAN CATCH  Final   Special Requests NONE  Final   Culture NO GROWTH  Final   Report Status 03/16/2017 FINAL  Final     Labs: BNP (last 3 results)  Recent Labs  06/14/16 0303 01/22/17 0509 03/14/17 0412  BNP 101.8* 183.2* 528.4*   Basic Metabolic Panel:  Recent Labs Lab 03/14/17 1055  03/14/17 2007 03/15/17 0344 03/15/17 1413 03/16/17 0425 03/17/17 0312  NA 132*  < > 134* 134* 133* 131* 135  K 3.5  < > 3.4* 3.2* 4.7 3.8 4.0  CL 92*  < > 95* 95* 94* 97* 98*  CO2 27  < > _0 GLUCOSE 448*  < > 158* 140* 248* 169* 172*  BUN 33*  < > 32* 30* 29* 27* 24*  CREATININE 2.40*  < > 2.02* 1.81* 1.83* 1.59* 1.58*  CALCIUM 8.0*  < > 8.0* 8.0* 8.1* 7.8* 8.1*  MG 1.6*  --   --   --   --   --   --   PHOS 3.0  --   --   --   --   --   --   < > =  values in this interval not displayed. Liver Function Tests:  Recent Labs Lab 03/14/17 0412  AST 129*  ALT 38  ALKPHOS 97  BILITOT 1.8*  PROT 7.1  ALBUMIN 2.2*   No results for input(s): LIPASE, AMYLASE in the last 168 hours. No results for input(s): AMMONIA in the last 168 hours. CBC:  Recent Labs Lab 03/14/17 0412 03/15/17 1015 03/16/17 0425 03/17/17 0312  WBC 35.3* 25.8* 19.3* 23.6*  NEUTROABS 31.8*  --  15.8*  --   HGB 8.4* 8.9* 7.7* 7.8*  HCT 28.0* 29.1* 26.1* 26.5*  MCV 74.3* 73.3* 73.5* 73.4*  PLT 472* 469* 392 440*   Cardiac Enzymes:  Recent Labs Lab 03/14/17 1055 03/14/17 1441 03/14/17 2007  TROPONINI 1.07* 1.06* 0.85*   BNP: Invalid input(s): POCBNP CBG:  Recent Labs Lab 03/17/17 0625 03/17/17 1143 03/17/17 1619 03/17/17 2024 03/18/17 0620  GLUCAP 183* 188* 190* 220* 217*   D-Dimer No results for input(s): DDIMER in the last 72 hours. Hgb A1c No results for  input(s): HGBA1C in the last 72 hours. Lipid Profile No results for input(s): CHOL, HDL, LDLCALC, TRIG, CHOLHDL, LDLDIRECT in the last 72 hours. Thyroid function studies No results for input(s): TSH, T4TOTAL, T3FREE, THYROIDAB in the last 72 hours.  Invalid input(s): FREET3 Anemia work up  Recent Labs  03/18/17 1021 03/18/17 1022  FOLATE  --  20.6  FERRITIN 169  --   TIBC 217*  --   IRON 14*  --   RETICCTPCT 2.2  --    Urinalysis    Component Value Date/Time   COLORURINE YELLOW 03/15/2017 1253   APPEARANCEUR HAZY (A) 03/15/2017 1253   LABSPEC 1.017 03/15/2017 1253   PHURINE 5.0 03/15/2017 1253   GLUCOSEU NEGATIVE 03/15/2017 1253   HGBUR NEGATIVE 03/15/2017 1253   BILIRUBINUR NEGATIVE 03/15/2017 1253   KETONESUR NEGATIVE 03/15/2017 1253   PROTEINUR NEGATIVE 03/15/2017 1253   UROBILINOGEN 1.0 11/27/2014 0755   NITRITE NEGATIVE 03/15/2017 1253   LEUKOCYTESUR NEGATIVE 03/15/2017 1253   Sepsis Labs Invalid input(s): PROCALCITONIN,  WBC,  LACTICIDVEN Microbiology Recent Results (from the past 240 hour(s))  Blood culture (routine x 2)     Status: None (Preliminary result)   Collection Time: 03/14/17  5:20 AM  Result Value Ref Range Status   Specimen Description BLOOD RIGHT ARM  Final   Special Requests   Final    BOTTLES DRAWN AEROBIC AND ANAEROBIC Blood Culture results may not be optimal due to an excessive volume of blood received in culture bottles   Culture NO GROWTH 3 DAYS  Final   Report Status PENDING  Incomplete  Blood culture (routine x 2)     Status: None (Preliminary result)   Collection Time: 03/14/17  5:32 AM  Result Value Ref Range Status   Specimen Description BLOOD RIGHT HAND  Final   Special Requests IN PEDIATRIC BOTTLE Blood Culture adequate volume  Final   Culture NO GROWTH 3 DAYS  Final   Report Status PENDING  Incomplete  Urine culture     Status: None   Collection Time: 03/15/17 12:53 PM  Result Value Ref Range Status   Specimen Description  URINE, CLEAN CATCH  Final   Special Requests NONE  Final   Culture NO GROWTH  Final   Report Status 03/16/2017 FINAL  Final     Time coordinating discharge: Over 30 minutes  SIGNED:   Debbe Odea, MD  [name] 03/18/2017, 12:40 PM Pager   If 7PM-7AM, please contact night-coverage [email or web address] Password  [email or web address]

## 2017-03-18 NOTE — Clinical Social Work Note (Signed)
Clinical Social Work Assessment  Patient Details  Name: [name] MRN: [id number] Date of Birth: 03/12/1951  Date of referral:  03/18/17               Reason for consult:  Discharge Planning, Facility Placement                Permission sought to share information with:  Family Supports Permission granted to share information::  Yes, Verbal Permission Granted  Name::     Cindy Cdebaca  Agency::     Relationship::  [phone]  Contact Information:  sister  Housing/Transportation Living arrangements for the past 2 months:  Single Family Home Source of Information:  Patient Patient Interpreter Needed:  None Criminal Activity/Legal Involvement Pertinent to Current Situation/Hospitalization:  No - Comment as needed Significant Relationships:  Siblings Lives with:  Siblings Do you feel safe going back to the place where you live?  Yes Need for family participation in patient care:  Yes (Comment)  Care giving concerns:  Family not at bedside  Social Worker assessment / plan:  Clinical Social Worker met patient at bedside to discuss discharge plan. Patient had decline  R leg amputation this morning. CSW spoke to patient and patients stated he changed his mind and would like the amputation. Per MD note the only available time they have for amputation is next Wednesday. Per RN patient has signed consent form for next weeks scheduled amputation. Patient is agreeable to discharge to SNF facility prior to amputation. CSW made facility aware of the pending surgery for Wednesday  Employment status:  Retired Insurance information:  Managed Medicare PT Recommendations:  Not assessed at this time Information / Referral to community resources:  Skilled Nursing Facility  Patient/Family's Response to care: Patient verbalized appreciation for CSW role in care  Patient/Family's Understanding of and Emotional Response to Diagnosis, Current Treatment, and Prognosis:  Patient agreeable to discharge  to facility prior to amputation  Emotional Assessment Appearance:  Appears stated age Attitude/Demeanor/Rapport:  Other Affect (typically observed):  Appropriate Orientation:  Oriented to  Time, Oriented to Situation, Oriented to Place, Oriented to Self Alcohol / Substance use:  Not Applicable Psych involvement (Current and /or in the community):  No (Comment)  Discharge Needs  Concerns to be addressed:  No discharge needs identified Readmission within the last 30 days:  No Current discharge risk:  None Barriers to Discharge:  No Barriers Identified    C , LCSW 03/18/2017, 1:13 PM  

## 2017-03-18 NOTE — Progress Notes (Signed)
Pharmacy Antibiotic Note  [name] is a 66 y.o. male admitted on 03/14/2017 with sepsis/wound infection - R foot abscess/necrosis/osteo. Scheduled for amputation 9/7. Afebrile, WBC up 23.6. Scr much improved - down to 1.58 (no labs today).  Plan: Zosyn 3.375 gm iv q8h Vancomycin to 1250 mg IV Q 12 Monitor cx, renal fx, VT soon if continuing F/u de-escalation plan/LOT post-op amputation 9/7 BMET in AM  Height: 6' (182.9 cm) Weight: (!) 365 lb 12.8 oz (165.9 kg) IBW/kg (Calculated) : 77.6  Temp (24hrs), Avg:98.4 F (36.9 C), Min:98.3 F (36.8 C), Max:98.5 F (36.9 C)   Recent Labs Lab 03/14/17 0412 03/14/17 0609 03/14/17 1055 03/14/17 1102 03/14/17 1138  03/14/17 2007 03/15/17 0344 03/15/17 1015 03/15/17 1413 03/16/17 0425 03/17/17 0312  WBC 35.3*  --   --   --   --   --   --   --  25.8*  --  19.3* 23.6*  CREATININE 3.08*  --  2.40*  --  2.36*  < > 2.02* 1.81*  --  1.83* 1.59* 1.58*  LATICACIDVEN  --  8.29* 2.6* 2.46* 2.1*  --   --   --   --   --   --   --   < > = values in this interval not displayed.  Estimated Creatinine Clearance: 74.4 mL/min (A) (by C-G formula based on SCr of 1.58 mg/dL (H)).    No Known Allergies  Babs Bertin, PharmD, BCPS Clinical Pharmacist Rx Phone # for today: [phone] After 3:30PM, please call Main Rx: [phone] 03/18/2017 8:35 AM

## 2017-03-18 NOTE — Progress Notes (Addendum)
PROGRESS NOTE    [name]   WUZ:[id number]  DOB: [birth year]  DOA: 03/14/2017 PCP: [name], MD   Brief Narrative:  [name] is a 66 y/o male with DM2, CKD 3, A-fib, HTN who presents with right leg pain and an ulcer on the planter aspect of the right foot. MRI show abscess and osteomyelitis. Also found to have DKA, SVT, AKI with CR 3.8.  Lactic acid was 8.29.  Troponin max 1.47 WBC 35   Subjective: He has no complaints today ROS: no complaints of nausea, vomiting, constipation diarrhea, cough, dyspnea or dysuria   Assessment & Plan:   Principal Problem:   Acute osteomyelitis and necrosis of right foot with abscess in lateral aspect of foot  Leukocytosis, tachycardia, Lactic acidosis >> Severe Sepsis - for transtibial amputation today - cont Vanc and Zosyn- WBC noted to be rising which I have told him - has diabetic neuropathy in feet which is quite severe- cont Neurontin   Active Problems:     Atrial fibrillation with RVR - may be due to pain and so Oxycodone does increased to home dose, 30 every 4 hrs- have told RN to hold if asleep - checked with RN, he is asleep but HR still > 110 - added Cardizem 60 mg QID - cont Metoprolol 50 BID  - rate much better controlled now - Xarelto on hold right now for OR and has been on Lovenox BID   Anemia - likely dilutional- stop IVF today- weight up by 17 lb since admission - anemia panel tomorrow    Osteoarthritis of both knees - Oxycodone as mentioned above    Diabetes mellitus type 2, uncontrolled, with complications   - DKA resolved - Lantus 25 U and SSI - sugars controlled in hospital - Hb A1c 8.2 and has been 8-9 as far back at 10/2015-was 10 in 2016  CAD S/P percutaneous coronary angioplasty Mildly elevated Troponin- flat trend - aspirin, Lipitor    Chronic diastolic congestive heart failure  - stable- following I and O and weights      CKD (chronic kidney disease) stage 3, GFR 30-59 ml/min -  stable   Body mass index is 49.61 kg/m.    DVT prophylaxis: Lovenox Code Status: Full code Family Communication:  Disposition Plan: possibly SNF after amputation Consultants:   ortho Procedures:    Antimicrobials:  Anti-infectives    Start     Dose/Rate Route Frequency Ordered Stop   03/16/17 0600  vancomycin (VANCOCIN) 2,000 mg in sodium chloride 0.9 % 500 mL IVPB  Status:  Discontinued     2,000 mg 250 mL/hr over 120 Minutes Intravenous Every 48 hours 03/14/17 0901 03/15/17 1016   03/15/17 1100  vancomycin (VANCOCIN) 1,250 mg in sodium chloride 0.9 % 250 mL IVPB     1,250 mg 166.7 mL/hr over 90 Minutes Intravenous Every 12 hours 03/15/17 1016     03/14/17 1400  piperacillin-tazobactam (ZOSYN) IVPB 3.375 g     3.375 g 12.5 mL/hr over 240 Minutes Intravenous Every 8 hours 03/14/17 0901     03/14/17 0915  vancomycin (VANCOCIN) IVPB 1000 mg/200 mL premix     1,000 mg 200 mL/hr over 60 Minutes Intravenous  Once 03/14/17 0901 03/14/17 1116   03/14/17 0615  vancomycin (VANCOCIN) IVPB 1000 mg/200 mL premix     1,000 mg 200 mL/hr over 60 Minutes Intravenous  Once 03/14/17 0609 03/14/17 0836   03/14/17 0615  piperacillin-tazobactam (ZOSYN) IVPB 3.375 g  3.375 g 12.5 mL/hr over 240 Minutes Intravenous  Once 03/14/17 0609 03/14/17 0844       Objective: Vitals:   03/17/17 2000 03/18/17 0000 03/18/17 0439 03/18/17 0754  BP:  (!) 97/56  122/61  Pulse:  74    Resp:  16    Temp: 98.4 F (36.9 C)  98.5 F (36.9 C) 98.5 F (36.9 C)  TempSrc: Oral  Axillary Oral  SpO2:  93%    Weight:   (!) 165.9 kg (365 lb 12.8 oz)   Height:        Intake/Output Summary (Last 24 hours) at 03/18/17 0812 Last data filed at 03/18/17 0509  Gross per 24 hour  Intake              730 ml  Output              900 ml  Net             -170 ml   Filed Weights   03/16/17 0400 03/17/17 0400 03/18/17 0439  Weight: (!) 166.7 kg (367 lb 9.6 oz) (!) 164.4 kg (362 lb 6.4 oz) (!) 165.9 kg (365 lb  12.8 oz)    Examination: General exam: Appears comfortable  HEENT: PERRLA, oral mucosa moist, no sclera icterus or thrush Respiratory system: Clear to auscultation. Respiratory effort normal. Cardiovascular system: S1 & S2 heard,  No murmurs - IIRR Gastrointestinal system: Abdomen soft, non-tender, nondistended. Normal bowel sound. No organomegaly Central nervous system: Alert and oriented. No focal neurological deficits. Extremities: No cyanosis, clubbing or edema Skin: foul smelling eschar right foot Psychiatry:  Mood & affect appropriate.     Data Reviewed: I have personally reviewed following labs and imaging studies  CBC:  Recent Labs Lab 03/14/17 0412 03/15/17 1015 03/16/17 0425 03/17/17 0312  WBC 35.3* 25.8* 19.3* 23.6*  NEUTROABS 31.8*  --  15.8*  --   HGB 8.4* 8.9* 7.7* 7.8*  HCT 28.0* 29.1* 26.1* 26.5*  MCV 74.3* 73.3* 73.5* 73.4*  PLT 472* 469* 392 440*   Basic Metabolic Panel:  Recent Labs Lab 03/14/17 1055  03/14/17 2007 03/15/17 0344 03/15/17 1413 03/16/17 0425 03/17/17 0312  NA 132*  < > 134* 134* 133* 131* 135  K 3.5  < > 3.4* 3.2* 4.7 3.8 4.0  CL 92*  < > 95* 95* 94* 97* 98*  CO2 27  < > 24 27 25 26 26   GLUCOSE 448*  < > 158* 140* 248* 169* 172*  BUN 33*  < > 32* 30* 29* 27* 24*  CREATININE 2.40*  < > 2.02* 1.81* 1.83* 1.59* 1.58*  CALCIUM 8.0*  < > 8.0* 8.0* 8.1* 7.8* 8.1*  MG 1.6*  --   --   --   --   --   --   PHOS 3.0  --   --   --   --   --   --   < > = values in this interval not displayed. GFR: Estimated Creatinine Clearance: 74.4 mL/min (A) (by C-G formula based on SCr of 1.58 mg/dL (H)). Liver Function Tests:  Recent Labs Lab 03/14/17 0412  AST 129*  ALT 38  ALKPHOS 97  BILITOT 1.8*  PROT 7.1  ALBUMIN 2.2*   No results for input(s): LIPASE, AMYLASE in the last 168 hours. No results for input(s): AMMONIA in the last 168 hours. Coagulation Profile:  Recent Labs Lab 03/14/17 1055  INR 3.24   Cardiac Enzymes:  Recent  Labs Lab 03/14/17  1055 03/14/17 1441 03/14/17 2007  TROPONINI 1.07* 1.06* 0.85*   BNP (last 3 results) No results for input(s): PROBNP in the last 8760 hours. HbA1C: No results for input(s): HGBA1C in the last 72 hours. CBG:  Recent Labs Lab 03/17/17 0625 03/17/17 1143 03/17/17 1619 03/17/17 2024 03/18/17 0620  GLUCAP 183* 188* 190* 220* 217*   Lipid Profile: No results for input(s): CHOL, HDL, LDLCALC, TRIG, CHOLHDL, LDLDIRECT in the last 72 hours. Thyroid Function Tests: No results for input(s): TSH, T4TOTAL, FREET4, T3FREE, THYROIDAB in the last 72 hours. Anemia Panel: No results for input(s): VITAMINB12, FOLATE, FERRITIN, TIBC, IRON, RETICCTPCT in the last 72 hours. Urine analysis:    Component Value Date/Time   COLORURINE YELLOW 03/15/2017 1253   APPEARANCEUR HAZY (A) 03/15/2017 1253   LABSPEC 1.017 03/15/2017 1253   PHURINE 5.0 03/15/2017 1253   GLUCOSEU NEGATIVE 03/15/2017 1253   HGBUR NEGATIVE 03/15/2017 1253   BILIRUBINUR NEGATIVE 03/15/2017 1253   KETONESUR NEGATIVE 03/15/2017 1253   PROTEINUR NEGATIVE 03/15/2017 1253   UROBILINOGEN 1.0 11/27/2014 0755   NITRITE NEGATIVE 03/15/2017 1253   LEUKOCYTESUR NEGATIVE 03/15/2017 1253   Sepsis Labs: @LABRCNTIP (procalcitonin:4,lacticidven:4) ) Recent Results (from the past 240 hour(s))  Blood culture (routine x 2)     Status: None (Preliminary result)   Collection Time: 03/14/17  5:20 AM  Result Value Ref Range Status   Specimen Description BLOOD RIGHT ARM  Final   Special Requests   Final    BOTTLES DRAWN AEROBIC AND ANAEROBIC Blood Culture results may not be optimal due to an excessive volume of blood received in culture bottles   Culture NO GROWTH 3 DAYS  Final   Report Status PENDING  Incomplete  Blood culture (routine x 2)     Status: None (Preliminary result)   Collection Time: 03/14/17  5:32 AM  Result Value Ref Range Status   Specimen Description BLOOD RIGHT HAND  Final   Special Requests IN  PEDIATRIC BOTTLE Blood Culture adequate volume  Final   Culture NO GROWTH 3 DAYS  Final   Report Status PENDING  Incomplete  Urine culture     Status: None   Collection Time: 03/15/17 12:53 PM  Result Value Ref Range Status   Specimen Description URINE, CLEAN CATCH  Final   Special Requests NONE  Final   Culture NO GROWTH  Final   Report Status 03/16/2017 FINAL  Final         Radiology Studies: No results found.    Scheduled Meds: . albuterol  5 mg Nebulization Once  . aspirin EC  81 mg Oral Daily  . atorvastatin  80 mg Oral QPM  . diclofenac sodium  4 g Topical QID  . diltiazem  60 mg Oral Q6H  . gabapentin  600 mg Oral BID  . insulin aspart  0-20 Units Subcutaneous TID WC  . insulin glargine  25 Units Subcutaneous BID  . metoprolol tartrate  50 mg Oral BID  . oxycodone  30 mg Oral Q4H   Continuous Infusions: . piperacillin-tazobactam (ZOSYN)  IV 3.375 g (03/18/17 0509)  . vancomycin Stopped (03/18/17 0100)     LOS: 4 days    Time spent in minutes: 35    Calvert Cantor, MD [name] Pager: [email or web address] Password [email or web address] 03/18/2017, 8:12 AM

## 2017-03-18 NOTE — Telephone Encounter (Signed)
RX faxed to [facility] @ [phone], phone number [phone] 

## 2017-03-18 NOTE — H&P (View-Only) (Signed)
Patient ID: [name], male   DOB: 12/05/1950, 66 y.o.   MRN: [id number] Patient is still requiring around-the-clock morphine. Again recommended proceeding with the transtibial amputation. Patient states he understands he wants to talk with his sister today I discussed that we will place the patient nothing by mouth after midnight and we will reserve a place for surgery tomorrow for a transtibial amputation on the right. 

## 2017-03-18 NOTE — Progress Notes (Signed)
Patient ID: [name], male   DOB: 1950-10-23, 66 y.o.   MRN: [id number] Patient canceled his surgery this morning. When I saw him at bedside he was drinking ginger ale and eating Jell-O. Patient states that he will not proceed with surgery today he would like to proceed at a different date. I discussed that this is AGAINST MEDICAL ADVICE. Discussed that the next time I can get him on the schedule would be Wednesday. Discussed that patient could potentially die from the infection in his foot with delaying surgery. Patient states he understands and stated that he would rather die than have surgery today. I will reevaluate on Monday.

## 2017-03-18 NOTE — Interval H&P Note (Signed)
History and Physical Interval Note:  03/18/2017 7:31 AM  [name]  has presented today for surgery, with the diagnosis of Osteomyelitis, Abscess Right Foot  The various methods of treatment have been discussed with the patient and family. After consideration of risks, benefits and other options for treatment, the patient has consented to  Procedure(s): RIGHT BELOW KNEE AMPUTATION (Right) as a surgical intervention .  The patient's history has been reviewed, patient examined, no change in status, stable for surgery.  I have reviewed the patient's chart and labs.  Questions were answered to the patient's satisfaction.     Nadara Mustard

## 2017-03-18 NOTE — Care Management Important Message (Signed)
Important Message  Patient Details  Name: [name] MRN: [id number] Date of Birth: 11/07/1950   Medicare Important Message Given:  Yes    Kyla Balzarine 03/18/2017, 9:41 AM

## 2017-03-18 NOTE — Progress Notes (Signed)
Dr.Duda in this am to speak with patient regarding consequences of not having surgery today. Patient verbalized understanding. Notified patients sister who is talking with patient on phone to plea for patient to have surgery today. Waiting on consent from patient. Continuing to monitor.

## 2017-03-18 NOTE — Progress Notes (Signed)
OR techician here to pick up patient for surgery. Patient is refusing to go. Notified short stay to notify Dr. Lajoyce Corners who is performing surgery at this time. Notifying patients sister

## 2017-03-18 NOTE — Progress Notes (Signed)
Discharging to [facility]. Report called and received by Boykin Reaper'., RN. Patient is aware of transfer. Patients sister notified of transfer.  Facility nurse. Aware that patient is to be returned to [facility] Tuesday night  For patient surgery Wednesday. notifiying Dr. To send preop orders to facility

## 2017-03-18 NOTE — NC FL2 (Signed)
[place] MEDICAID FL2 LEVEL OF CARE SCREENING TOOL     IDENTIFICATION  Patient Name: [name] Birthdate: 1950-11-11 Sex: male Admission Date (Current Location): 03/14/2017  [facility] and [place] Number:  Producer, television/film/video and Address:  [facility], [address] [place], [place] [address]      Provider Number: [id number]  Attending Physician Name and Address:  Calvert Cantor, MD  Relative Name and Phone Number:  Silvano Garofano    Current Level of Care: Hospital Recommended Level of Care: Skilled Nursing Facility Prior Approval Number:    Date Approved/Denied:   PASRR Number: [id number]  Discharge Plan: SNF    Current Diagnoses: Patient Active Problem List   Diagnosis Date Noted  . Acute osteomyelitis of right foot (HCC) 03/16/2017  . DKA (diabetic ketoacidoses) (HCC) 03/14/2017  . Elevated troponin 03/14/2017  . Subacute osteomyelitis of right foot (HCC)   . Diabetic polyneuropathy associated with type 2 diabetes mellitus (HCC)   . Diabetic ulcer of right midfoot associated with diabetes mellitus due to underlying condition, limited to breakdown of skin (HCC)   . Osteoarthritis of both knees   . Diabetic nephropathy associated with type 2 diabetes mellitus (HCC)   . Bilateral lower leg cellulitis 01/22/2017  . Venous stasis 08/06/2016  . At high risk for falls 07/21/2016  . CHF exacerbation (HCC) 06/10/2016  . Lactic acidosis 06/09/2016  . Syncope and collapse 03/05/2016  . Acute on chronic renal insufficiency 03/05/2016  . Obesity 12/30/2015  . Scrotal swelling   . Foot fracture   . 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 Lower GI bleed   . Pressure ulcer 12/24/2015  . Rhabdomyolysis   . Acute kidney injury (HCC) 12/23/2015  . Back pain 12/23/2015  . Fall   . Metatarsal fracture   . Traumatic rhabdomyolysis (HCC)   . Hyperkalemia   . Diabetes mellitus type 2 in obese (HCC)   . Lisfranc dislocation   .  Hyperglycemia   . Sepsis, unspecified organism (HCC) 09/08/2015  . Scrotal wall abscess 09/08/2015  . AKI (acute kidney injury) (HCC) 09/08/2015  . Chest wall pain   . On amiodarone therapy 12/08/2014  . Respiratory failure with hypoxia (HCC) 12/04/2014  . Hypoxia 12/04/2014  . Acute on chronic respiratory failure with hypoxia (HCC)   . Atrial fibrillation with RVR (HCC) 09/20/2014  . Acute on chronic diastolic CHF (congestive heart failure) (HCC) 09/19/2014  . Atrial fibrillation with rapid ventricular response (HCC) 09/19/2014  . Acute respiratory failure (HCC) 09/19/2014  . Abnormal nuclear cardiac imaging test 02/12/2014  . Dyspnea 02/12/2014  . Chronic anticoagulation- Coumadin 02/12/2014  . CKD (chronic kidney disease) stage 3, GFR 30-59 ml/min 02/12/2014  . Chronic diastolic congestive heart failure (HCC) 02/11/2014  . Encounter for therapeutic drug monitoring 08/03/2013  . Pilonidal cyst 01/25/2013  . Perirectal cellulitis 09/06/2012  . Long term (current) use of anticoagulants 11/18/2011  . Obstructive sleep apnea- unable to tolerate c-pap 08/04/2011  . CAD S/P percutaneous coronary angioplasty 08/04/2011  . Diabetes mellitus type 2, uncontrolled, with complications (HCC) 01/06/2009  . ANEMIA-IRON DEFICIENCY 01/06/2009  . OTHER AND UNSPECIFIED COAGULATION DEFECTS 01/06/2009  . RECTAL BLEEDING 01/06/2009  . THYROID STIMULATING HORMONE, ABNORMAL 01/02/2009  . ANEMIA 01/02/2009  . CARDIOVASCULAR FUNCTION STUDY, ABNORMAL 01/02/2009  . Essential hypertension, benign 10/22/2008  . Atrial fibrillation 10/22/2008  . DIAB W/UNSPEC COMP TYPE II/UNSPEC TYPE UNCNTRL 09/23/2008  . Hyperlipidemia 09/23/2008  . HYPOKALEMIA 09/23/2008  .  Obesity, Class III, BMI 40-49.9 (morbid obesity) (HCC) 09/23/2008  . CELLULITIS AND ABSCESS OF LEG EXCEPT FOOT 09/23/2008    Orientation RESPIRATION BLADDER Height & Weight     Self, Time, Situation, Place  O2 (2L) Continent Weight: (!) 365 lb  12.8 oz (165.9 kg) Height:  6' (182.9 cm)  BEHAVIORAL SYMPTOMS/MOOD NEUROLOGICAL BOWEL NUTRITION STATUS      Continent Diet (heart healthy/carb modified)  AMBULATORY STATUS COMMUNICATION OF NEEDS Skin   Extensive Assist (his x3 assist) Verbally (slow to respond) Normal, Other (Comment) (diabetic would on r foot)                       Personal Care Assistance Level of Assistance  Bathing, Feeding, Dressing Bathing Assistance: Maximum assistance Feeding assistance: Independent Dressing Assistance: Maximum assistance     Functional Limitations Info  Sight, Hearing, Speech Sight Info: Adequate Hearing Info: Adequate Speech Info: Adequate    SPECIAL CARE FACTORS FREQUENCY  PT (By licensed PT), OT (By licensed OT)     PT Frequency: 5x wk OT Frequency: 5x wk            Contractures Contractures Info: Not present    Additional Factors Info  Code Status, Allergies Code Status Info: Full Code Allergies Info: NKA           Current Medications (03/18/2017):  This is the current hospital active medication list Current Facility-Administered Medications  Medication Dose Route Frequency Provider Last Rate Last Dose  . acetaminophen (TYLENOL) tablet 650 mg  650 mg Oral Q6H PRN Ozella Rocks, MD       Or  . acetaminophen (TYLENOL) suppository 650 mg  650 mg Rectal Q6H PRN Ozella Rocks, MD      . albuterol (PROVENTIL) (2.5 MG/3ML) 0.083% nebulizer solution 5 mg  5 mg Nebulization Once Geoffery Lyons, MD      . aspirin EC tablet 81 mg  81 mg Oral Daily Ozella Rocks, MD   81 mg at 03/18/17 0802  . atorvastatin (LIPITOR) tablet 80 mg  80 mg Oral QPM Ozella Rocks, MD   80 mg at 03/17/17 1654  . diclofenac sodium (VOLTAREN) 1 % transdermal gel 4 g  4 g Topical QID Calvert Cantor, MD   4 g at 03/18/17 0807  . diltiazem (CARDIZEM CD) 24 hr capsule 240 mg  240 mg Oral Daily Rizwan, Saima, MD      . gabapentin (NEURONTIN) capsule 600 mg  600 mg Oral BID Ozella Rocks, MD    600 mg at 03/18/17 7510  . insulin aspart (novoLOG) injection 0-20 Units  0-20 Units Subcutaneous TID WC Calvert Cantor, MD   7 Units at 03/18/17 0816  . insulin glargine (LANTUS) injection 25 Units  25 Units Subcutaneous BID Schorr, Roma Kayser, NP   25 Units at 03/18/17 [phone]  . metoprolol tartrate (LOPRESSOR) injection 2.5-5 mg  2.5-5 mg Intravenous Q5 min PRN Rizwan, Ladell Heads, MD      . metoprolol tartrate (LOPRESSOR) tablet 50 mg  50 mg Oral BID Coralie Keens, MD   50 mg at 03/18/17 0820  . morphine 4 MG/ML injection 4 mg  4 mg Intravenous Q4H PRN Ozella Rocks, MD   4 mg at 03/18/17 2778  . ondansetron (ZOFRAN) tablet 4 mg  4 mg Oral Q6H PRN Ozella Rocks, MD       Or  . ondansetron [facility]) injection 4 mg  4 mg Intravenous Q6H PRN Konrad Dolores,  Elmon Else, MD      . oxyCODONE (Oxy IR/ROXICODONE) immediate release tablet 30 mg  30 mg Oral Q4H Calvert Cantor, MD   30 mg at 03/18/17 0757  . piperacillin-tazobactam (ZOSYN) IVPB 3.375 g  3.375 g Intravenous Q8H Bertram Millard, RPH 12.5 mL/hr at 03/18/17 0509 3.375 g at 03/18/17 0509  . vancomycin (VANCOCIN) 1,250 mg in sodium chloride 0.9 % 250 mL IVPB  1,250 mg Intravenous Q12H Arrien, York Ram, MD   Stopped at 03/18/17 0100     Discharge Medications: Please see discharge summary for a list of discharge medications.  Relevant Imaging Results:  Relevant Lab Results:   Additional Information    Althea Charon, LCSW

## 2017-03-18 NOTE — Progress Notes (Signed)
Patient is on schedule for surgery today. States "have consented to have surgery tomorrow or never because want sister at hospital when having surgery". Sister is working 12 hours today. Asked if spoke with Dr. Lajoyce Corners, patient states yes, however, this nurse is paging Dr. Lajoyce Corners via amion to clarify. Patient had 13 runs of Vtach per CCMD who reported to night nurse. Patient asymtomatic. Waiting on clarification of whether surgery today or tomorrow.

## 2017-03-19 LAB — CULTURE, BLOOD (ROUTINE X 2)
Culture: NO GROWTH
Culture: NO GROWTH
Special Requests: ADEQUATE

## 2017-03-21 ENCOUNTER — Other Ambulatory Visit: Payer: Self-pay

## 2017-03-21 ENCOUNTER — Encounter: Payer: Self-pay | Admitting: Adult Health

## 2017-03-21 ENCOUNTER — Non-Acute Institutional Stay (SKILLED_NURSING_FACILITY): Payer: Medicare Other | Admitting: Adult Health

## 2017-03-21 DIAGNOSIS — M170 Bilateral primary osteoarthritis of knee: Secondary | ICD-10-CM

## 2017-03-21 DIAGNOSIS — E08621 Diabetes mellitus due to underlying condition with foot ulcer: Secondary | ICD-10-CM

## 2017-03-21 DIAGNOSIS — Z794 Long term (current) use of insulin: Secondary | ICD-10-CM

## 2017-03-21 DIAGNOSIS — I4891 Unspecified atrial fibrillation: Secondary | ICD-10-CM

## 2017-03-21 DIAGNOSIS — Z9861 Coronary angioplasty status: Secondary | ICD-10-CM | POA: Diagnosis not present

## 2017-03-21 DIAGNOSIS — E1142 Type 2 diabetes mellitus with diabetic polyneuropathy: Secondary | ICD-10-CM

## 2017-03-21 DIAGNOSIS — J9611 Chronic respiratory failure with hypoxia: Secondary | ICD-10-CM | POA: Diagnosis not present

## 2017-03-21 DIAGNOSIS — I2510 Atherosclerotic heart disease of native coronary artery without angina pectoris: Secondary | ICD-10-CM | POA: Diagnosis not present

## 2017-03-21 DIAGNOSIS — I5033 Acute on chronic diastolic (congestive) heart failure: Secondary | ICD-10-CM

## 2017-03-21 DIAGNOSIS — L97411 Non-pressure chronic ulcer of right heel and midfoot limited to breakdown of skin: Secondary | ICD-10-CM

## 2017-03-21 DIAGNOSIS — E1165 Type 2 diabetes mellitus with hyperglycemia: Secondary | ICD-10-CM

## 2017-03-21 DIAGNOSIS — I10 Essential (primary) hypertension: Secondary | ICD-10-CM | POA: Diagnosis not present

## 2017-03-21 DIAGNOSIS — E1122 Type 2 diabetes mellitus with diabetic chronic kidney disease: Secondary | ICD-10-CM

## 2017-03-21 DIAGNOSIS — IMO0002 Reserved for concepts with insufficient information to code with codable children: Secondary | ICD-10-CM

## 2017-03-21 DIAGNOSIS — G4733 Obstructive sleep apnea (adult) (pediatric): Secondary | ICD-10-CM | POA: Diagnosis not present

## 2017-03-21 DIAGNOSIS — N183 Chronic kidney disease, stage 3 (moderate): Secondary | ICD-10-CM

## 2017-03-21 LAB — CBC AND DIFFERENTIAL
HCT: 35 — AB (ref 41–53)
Hemoglobin: 11 — AB (ref 13.5–17.5)
Neutrophils Absolute: 20
Platelets: 570 — AB (ref 150–399)
WBC: 22.6

## 2017-03-21 LAB — BASIC METABOLIC PANEL
BUN: 31 — AB (ref 4–21)
Creatinine: 1.5 — AB (ref 0.6–1.3)
Glucose: 255
Potassium: 5.3 (ref 3.4–5.3)
SODIUM: 140 (ref 137–147)

## 2017-03-21 MED ORDER — OXYCODONE HCL 15 MG PO TABS: 15.0000 mg | ORAL_TABLET | ORAL | 0 refills | Status: DC

## 2017-03-21 NOTE — Progress Notes (Signed)
Location:   [facility] Room Number: 751 A Place of Service:  SNF (31)   CODE STATUS: Full code  No Known Allergies  Chief Complaint  Patient presents with  . Hospitalization Follow-up    Hospital follow up rom 03-14-17 through 03-18-17    HPI:  He has been hospitalized for his acute osteomyelitis of his right foot. He was admitted with a wbc of 35 was discharged with a wbc of 23. He was placed on IV vancomycin and zosyn. Dr Sharol Given has recommended a transtibial amputation; he did decline this while in the hospital. He is scheduled for this Wednesday. His xarelto is being held for the upcoming surgery. He does have severe pain in that foot; which is not being managed. His goal at this time is to return home when he is able.  Over this past weekend he became somnolent and required to have a dose reduction of his oxycodone from 30 mg to 15 mg.    Past Medical History:  Diagnosis Date  . Arthritis    "hands and lower back" (09/19/2014)  . CAD (coronary artery disease)    a. s/p PCI to RCA in 2012. b. prior cath in 01/2014 with elevated L/RH pressures, mild-mod CAD of LAD/LCx with patent RCA, normal EF, c/b CIN/CHF.  [name] Carpal tunnel syndrome, bilateral   . Cellulitis   . Chronic diastolic CHF (congestive heart failure) ([place])   . Chronic kidney disease (CKD), stage III (moderate)   . Chronic lower back pain   . Diabetes ([place])   . DKA (diabetic ketoacidoses) ([place]) 03/2017  . History of blood transfusion ~ 1954   "related to OR"  . Hyperlipidemia   . Hypertension   . Hypoxia    a. Qualified for home O2 at [place] in 09/2014.  [name] Lower GI bleed   . Microcytic anemia   . Morbid obesity ([place])   . Neuropathy   . OSA (obstructive sleep apnea)    "I wear nasal prongs; haven't been using prongs recently" (09/19/2014)  . PAF (paroxysmal atrial fibrillation) ([place])    TEE DCCV 09/23/2014  . Physical deconditioning   . Pilonidal cyst 1980's; 01/25/2013  . Scrotal abscess   . Type II  diabetes mellitus ([place])     Past Surgical History:  Procedure Laterality Date  . ABDOMINAL SURGERY  ~ 1954   BENIGN TUMOR REMOVED  . APPENDECTOMY    . CARDIOVERSION  2010   Archie Endo 09/19/2014  . CARDIOVERSION N/A 09/23/2014   Procedure: CARDIOVERSION;  Surgeon: Lelon Perla, MD;  Location: [facility] ENDOSCOPY;  Service: Cardiovascular;  Laterality: N/A;  . CARDIOVERSION N/A 12/11/2014   Procedure: CARDIOVERSION;  Surgeon: Sanda Klein, MD;  Location: [facility] ENDOSCOPY;  Service: Cardiovascular;  Laterality: N/A;  . CATARACT EXTRACTION W/PHACO Right 11/15/2012   Procedure: CATARACT EXTRACTION PHACO AND INTRAOCULAR LENS PLACEMENT ([place]);  Surgeon: Adonis Brook, MD;  Location: [place];  Service: Ophthalmology;  Laterality: Right;  . CATARACT EXTRACTION W/PHACO Left 11/29/2012   Procedure: CATARACT EXTRACTION PHACO AND INTRAOCULAR LENS PLACEMENT (IOC);  Surgeon: Adonis Brook, MD;  Location: [place];  Service: Ophthalmology;  Laterality: Left;  . CORONARY ANGIOPLASTY WITH STENT PLACEMENT  August 2012   RCA DES - [facility]  . DEBRIDEMENT  FOOT Left    debriding diabetic foot ulcers  . EYE SURGERY    . FOREIGN BODY REMOVAL Right 2014   heel,  splinter removal   . LEFT AND RIGHT HEART CATHETERIZATION WITH CORONARY  ANGIOGRAM N/A 01/31/2014   Procedure: LEFT AND RIGHT HEART CATHETERIZATION WITH CORONARY ANGIOGRAM;  Surgeon: Blane Ohara, MD;  Location: [facility] CATH LAB;  Service: Cardiovascular;  Laterality: N/A;  . PARS PLANA VITRECTOMY Left 06/05/2013   Procedure: PARS PLANA VITRECTOMY WITH 23 GAUGE with Endolaser(constellation);  Surgeon: Adonis Brook, MD;  Location: [place];  Service: Ophthalmology;  Laterality: Left;  . PILONIDAL CYST EXCISION N/A 01/08/2013   Procedure: CYST EXCISION PILONIDAL EXTENSIVE;  Surgeon: Ralene Ok, MD;  Location: [place];  Service: General;  Laterality: N/A;  . PILONIDAL CYST EXCISION  1980's   "in [place]"  . TEE WITHOUT CARDIOVERSION N/A 09/23/2014    Procedure: TRANSESOPHAGEAL ECHOCARDIOGRAM (TEE);  Surgeon: Lelon Perla, MD;  Location: [facility] ENDOSCOPY;  Service: Cardiovascular;  Laterality: N/A;  . TONSILLECTOMY      Social History   Social History  . Marital status: Single    Spouse name: N/A  . Number of children: Y  . Years of education: N/A   Occupational History  . disabled truck driver    Social History Main Topics  . Smoking status: Former Smoker    Packs/day: 1.00    Years: 20.00    Types: Cigarettes    Quit date: 07/13/1983  . Smokeless tobacco: Never Used  . Alcohol use No  . Drug use: No  . Sexual activity: Not Currently   Other Topics Concern  . Not on file   Social History Narrative   Pt is single.   Lives with sister.   Has children.   Was adopted.   Daily cafffiene-2 cups of coffee and 2 sodas per day         Family History  Problem Relation Age of Onset  . Adopted: Yes  . Other Other        PT ADOPTED      VITAL SIGNS BP (!) 184/112 (BP Location: Right Arm) Comment: right arm  Pulse (!) 117   Temp (!) 96.4 F (35.8 C)   Resp 18   Ht _0  (1.905 m)   Wt (!) 358 lb 6.4 oz (162.6 kg)   SpO2 (!) 88% Comment: increased O2 to 4l/m and came up to 97%  BMI 44.80 kg/m   Patient's Medications  New Prescriptions   No medications on file  Previous Medications   ACETAMINOPHEN (TYLENOL) 500 MG TABLET    Take 1,000 mg by mouth 3 (three) times daily.   AMOXICILLIN-CLAVULANATE (AUGMENTIN) 875-125 MG TABLET    Take 1 tablet by mouth every 12 (twelve) hours.   ASPIRIN EC 81 MG EC TABLET    Take 1 tablet (81 mg total) by mouth daily.   ATORVASTATIN (LIPITOR) 80 MG TABLET    Take 1 tablet (80 mg total) by mouth daily.   DEXTRAN 70-HYPROMELLOSE 0.1-0.3 % SOLN    Place 1 drop into both eyes daily as needed.   DILTIAZEM (CARDIZEM CD) 240 MG 24 HR CAPSULE    Take 1 capsule (240 mg total) by mouth daily.   DOCUSATE SODIUM (COLACE) 100 MG CAPSULE    Take 1 capsule (100 mg total) by mouth daily.    DOXYCYCLINE (DORYX) 100 MG EC TABLET    Take 100 mg by mouth 2 (two) times daily.   GABAPENTIN (NEURONTIN) 300 MG CAPSULE    Take 2 capsules (600 mg total) by mouth 2 (two) times daily.   HYDROCERIN (EUCERIN) CREA    Apply 1 application topically 3 (three) times daily. legs   HYPROMELLOSE (  NATURAL BALANCE TEARS OP)    Place 1 drop into both eyes daily as needed (for dry eyes).    INSULIN ASPART (NOVOLOG) 100 UNIT/ML INJECTION    Inject 10 units subcutaneously after meals for DM of BS is greater than 150 and eats greater than 50% meal   INSULIN GLARGINE (LANTUS SOLOSTAR) 100 UNIT/ML SOLOSTAR PEN    Inject 50 Units into the skin every morning.   KLOR-CON M20 20 MEQ TABLET    TAKE 2 TABLETS BY MOUTH TWICE A DAY   METOLAZONE (ZAROXOLYN) 5 MG TABLET    Take 35m twice weekly on Mondays and Fridays.   METOPROLOL TARTRATE (LOPRESSOR) 50 MG TABLET    Take 1 tablet (50 mg total) by mouth 2 (two) times daily.   MULTIPLE VITAMIN (MULTIVITAMIN WITH MINERALS) TABS    Take 1 tablet by mouth daily.   OXYCODONE (ROXICODONE) 15 MG IMMEDIATE RELEASE TABLET    Take 15 mg by mouth every 6 (six) hours as needed for pain.   POLYETHYLENE GLYCOL (MIRALAX / GLYCOLAX) PACKET    Take 17 g by mouth daily.   SPIRONOLACTONE (ALDACTONE) 25 MG TABLET    Take 25 mg by mouth daily.   TORSEMIDE (DEMADEX) 20 MG TABLET    Take 4 tablets (80 mg total) by mouth 2 (two) times daily.  Modified Medications   No medications on file  Discontinued Medications   ACCU-CHEK AVIVA PLUS TEST STRIP    USE AS INSTRUCTED TO TEST BLOOD SUGAR 6 TIMES DAILY BEFORE MEALS. DX CODE- E11.65   BD INSULIN SYRINGE ULTRAFINE 31G X 15/64" 0.3 ML MISC    USE TO IJNECT INSULIN 3 TIMES DAILY.   BLOOD GLUCOSE MONITORING SUPPL (ACCU-CHEK AVIVA PLUS) W/DEVICE KIT    USE TO CHECK BLOOD SUGAR 6 TIMES PER DAY DX CODE E11.65   DOXYCYCLINE (VIBRAMYCIN) 50 MG CAPSULE    Take 2 capsules (100 mg total) by mouth 2 (two) times daily.   INSULIN PEN NEEDLE 31G X 5 MM MISC     Use to inject insulin 4 times daily   LANCETS (ACCU-CHEK MULTICLIX) LANCETS    Use to test blood sugar 6 times daily- Dx code- E11.65   LANTUS 100 UNIT/ML INJECTION    INJECT 50 UNITS INTO THE SKIN 2 TIMES DAILY.   OXYCODONE (ROXICODONE) 30 MG IMMEDIATE RELEASE TABLET    Take 1 tablet (30 mg total) by mouth every 4 (four) hours as needed.   RIVAROXABAN (XARELTO) 20 MG TABS TABLET    TAKE 1 TABLET BY MOUTH EVERY DAY WITH SUPPER     SIGNIFICANT DIAGNOSTIC EXAMS  TODAY:   01-22-17: 2-d echo:  Compared to the prior study, there has been no significant interval change EF 60-65%  03-14-17: chest x-ray: Cardiomegaly and mild vascular/ interstitial prominence. No alveolar edema. No effusions.  03-14-17: right foot x-ray: Likely acute superimposed upon chronic osteomyelitis involving the cuboid bone and the fifth metatarsal.  03-15-17: mri right foot: 1. MR findings consistent with osteomyelitis involving the entire proximal shaft of the fifth metatarsal, the base of the fourthmetatarsal and the adjacent cuboid. Septic arthritis at the cuboid articulation with the fourth and fifth metatarsals is also suspected. 2. Complex soft tissue abscess and draining wound along the lateral and plantar aspect of the fifth metatarsal base. 3. Cellulitis and myofasciitis without definite findings for pyomyositis.  TODAY:   12-22-16: hgb a1c 8.2 03-14-17: wbc 35.3; hgb 8.4; hct 28.0; mcv 74.3; plt 472; glucose 454; bun 30; creat 3.08;  k+ 4.6; na++ 131; ast 129; total bili 1.8 albumin 2.2; mag 1.6; phos 3.0 blood culture: no growth  BNP 414.8 03-15-17: wbc 25.8; hgb 8.9; hct 29.1; mcv 73.3; plt 469; glucose 140; bun 30; creat 1.81; k+ 3.2; na++ 134; ca 8.0 urine culture: no growth 03-16-17: wbc 19.3; hgb 7.7; hct 26.1; mcv 73.5; plt 392 03-17-17: wbc 23.6; hgb 7.8; hct 26.5; mcv 73.4; plt 440; glucose 172; bun 24; creat 1.58; k+ 4.0; na++ 135; ca 8.1 03-18-17: iron 14; tibc 271; ferritin 169; folate 20.6   Review of Systems    Constitutional: Negative for malaise/fatigue.  Respiratory: Negative for cough and shortness of breath.   Cardiovascular: Negative for chest pain, palpitations and leg swelling.  Gastrointestinal: Negative for abdominal pain, constipation and heartburn.  Musculoskeletal: Positive for joint pain. Negative for back pain and myalgias.       Has severe right foot pain   Skin: Negative.   Neurological: Negative for dizziness.  Psychiatric/Behavioral: The patient is not nervous/anxious.    Physical Exam  Constitutional: He is oriented to person, place, and time. No distress.  Morbid obesity   Eyes: Conjunctivae are normal.  Neck: Neck supple. No JVD present. No thyromegaly present.  Cardiovascular: Normal rate and regular rhythm.   Unable to palpate pedal pulses   Respiratory: Effort normal. No respiratory distress. He has no wheezes.  diminished and 02 dependent   GI: Soft. Bowel sounds are normal. He exhibits no distension. There is no tenderness.  Musculoskeletal: He exhibits no edema.  Able to move all extremities  Limited movement due to obesity   Lymphadenopathy:    He has no cervical adenopathy.  Neurological: He is alert and oriented to person, place, and time.  Skin: Skin is warm and dry. He is not diaphoretic.  Lower extremities are discolored   Psychiatric: He has a normal mood and affect.    ASSESSMENT/ PLAN:  TODAY:   1. Acute on chronic diastolic heart failure: stable: EF 60-65% (01-22-17): will continue demadex 80 mg twice daily; aldactone 25 mg daily and zaroxolyn 2.5 mg twice weekly   2. Afib: heart rate stable; will continue asa 81 mg daily will continue cardizem cd 240 mg daily her xarelto is being held for upcoming surgery  3. CAD: s/p ptca: is stable will continue asa 81 mg daily will monitor   4. Diabetic neuropathy: is significant; no change in her status: will continue neurontin 600 mg twice daily   5. Dyslipidemia: is stable will continue lipitor 80 mg  daily   6.  Hypokalemia; k+ 4.0 is stable will continue k+ 40 meq twice daily   7. Constipation: stable will continue colace daily and miralax daily  8. Diabetes: no change in statis: hgb a1c 8.2 (12-22-16): will continue lantus 50  Units daily and novolog 10 units after meals   9. Hypertension: b/p 180/110: unchanged: did require clonidine 0.1 mg today. Will  Continue cardizem cd 240 mg daily   10. Acute on chronic respiratory failure with hypoxia: stable: is 02 dependent at 3 L/[place].   11. Obstructive sleep apnea: is unable to tolerate CPAP  12.  Diabetic right foot ulceration: is unchanged: will continue wound care as directed; will continue augmentin 875 mg twice daily and doxycycline 100 mg twice daily   Will change his oxycodone to 15 mg every 4 hours and to hold for sedation.   13. Osteoarthritis of bilateral knees: is on tylenol 1gm three times daily  14. Chronic renal disease  stage III bun 24 creat 1.58: is stable   Will check cbc; cmp   MD is aware of resident's narcotic use and is in agreement with current plan of care. We will attempt to wean resident as apropriate   Ok Edwards NP [facility] Adult Medicine  Contact [phone] Monday through Friday 8am- 5pm  After hours call [phone]

## 2017-03-21 NOTE — Telephone Encounter (Signed)
RX faxed to [facility] @ [phone], phone number [phone] 

## 2017-03-22 ENCOUNTER — Encounter (HOSPITAL_COMMUNITY): Payer: Self-pay | Admitting: *Deleted

## 2017-03-22 DIAGNOSIS — L89150 Pressure ulcer of sacral region, unstageable: Secondary | ICD-10-CM | POA: Diagnosis not present

## 2017-03-22 DIAGNOSIS — R2689 Other abnormalities of gait and mobility: Secondary | ICD-10-CM | POA: Diagnosis not present

## 2017-03-22 LAB — BASIC METABOLIC PANEL
BUN: 34 — AB (ref 4–21)
CREATININE: 1.5 — AB (ref 0.6–1.3)
GLUCOSE: 327
POTASSIUM: 4.3 (ref 3.4–5.3)
SODIUM: 136 — AB (ref 137–147)

## 2017-03-22 LAB — CBC AND DIFFERENTIAL
HEMATOCRIT: 29 — AB (ref 41–53)
Hemoglobin: 9.2 — AB (ref 13.5–17.5)
Neutrophils Absolute: 9
Platelets: 515 — AB (ref 150–399)
WBC: 12.3

## 2017-03-22 LAB — HEPATIC FUNCTION PANEL
ALT: 26 (ref 10–40)
AST: 45 — AB (ref 14–40)
Alkaline Phosphatase: 101 (ref 25–125)
Bilirubin, Total: 1

## 2017-03-22 NOTE — Progress Notes (Signed)
Anesthesia Chart Review:  Pt is a same day work up.   Pt is a resident of [facility].   Pt is a 66 year old male scheduled for R BKA on 03/23/2017 with Aldean Baker, MD  - PCP is Dierdre Searles, MD - Cardiologist is Olga Millers, MD.  Last office visit 08/02/16 with Azalee Course, PA - HF cardiologist is Arvilla Meres, MD. Last office visit 09/06/16.   PMH includes:  CAD (PCI to RCA 2012), PAF, chronic diastolic HF, HTN, DM, hyperlipidemia, OSA, CKD (stage III), anemia. Former smoker. BMI 45.  - Hospitalized 9/3-7/18 for osteomyelitis R foot. Complicated by DKA, SVT, AKI.   - Hospitalized 7/14-17/18 for acute on chronic diastolic CHF. Complicated by sepsis due to BLE cellulitis.   Medications include: Augmentin, ASA 81 mg, Lipitor, diltiazem, NovoLog, Lantus, potassium, metolazone, metoprolol, spironolactone, torsemide, xarelto.  Pt to hold xarelto 3 days before surgery.  Labs will be obtained DOS.  - labs from hospitalization 03/17/17 reviewed:  H/H 7.8/26.5, WBC 23.6.   CXR 03/14/17: Cardiomegaly and mild vascular/ interstitial prominence. No alveolar edema. No effusions.  EKG 03/14/17: Atrial fibrillation (132 bpm). LAFB. Probable anterior infarct, age indeterminate. Lateral leads are also involved. Prolonged QT interval. Baseline wander in lead(s) II III aVF  Echo 01/22/17:  - Left ventricle: The cavity size was normal. There was mild concentric hypertrophy. Systolic function was normal. The estimated ejection fraction was in the range of 60% to 65%. Wall motion was normal; there were no regional wall motion abnormalities. The study is not technically sufficient to allow evaluation of LV diastolic function. - Aortic valve: Trileaflet; mildly thickened, mildly calcified leaflets. - Mitral valve: Calcified annulus.  R and L cardiac cath 01/31/14:  1. LM: Patent. 2. LAD: Diffuse 40-50% mid stenosis. Diagonal branch is patent. 3. CX: Large OM branch. Mid AV circumflex just  beyond OM branch 50% stenosis. Intermediate branch divides into twin vessels is also widely patent. PLA branch patent 4. RCA: Dominant. Stented segment in proximal vessel patent. Mid vessel with 30% stenosis. PDA and PLA branches patent.  If labs acceptable DOS, I anticipate pt can proceed as scheduled.   Rica Mast, FNP-BC [facility]/Anesthesiology Phone: [phone] 03/22/2017 12:20 PM

## 2017-03-22 NOTE — Pre-Procedure Instructions (Signed)
[name]  03/22/2017      [id number] [place], [place] - [address] [place] [address] Phone: [phone] Fax: [phone]    Your procedure is scheduled on Wednesday, March 23, 2017.  Report to [facility] Admitting at 8:45 A.M.  Call this number if you have problems the morning of surgery:  [phone]   Remember: Send Lab results with patient  Do not eat food or drink liquids after midnight.  Take these medicines the morning of surgery with A SIP OF WATER: acetaminophen (TYLENOL), Aspirin, diltiazem (CARDIZEM CD), gabapentin (NEURONTIN), metoprolol tartrate (LOPRESSOR), If needed:oxyCODONE (ROXICODONE) for pain, eye drops Stop taking vitamins, fish oil and herbal medications. Do not take any NSAIDs ie: Ibuprofen, Advil, Naproxen (Aleve), Motrin, BC and Goody Powder; stop now.    How to Manage Your Diabetes Before and After Surgery  Why is it important to control my blood sugar before and after surgery? . Improving blood sugar levels before and after surgery helps healing and can limit problems. . A way of improving blood sugar control is eating a healthy diet by: o  Eating less sugar and carbohydrates o  Increasing activity/exercise o  Talking with your doctor about reaching your blood sugar goals . High blood sugars (greater than 180 mg/dL) can raise your risk of infections and slow your recovery, so you will need to focus on controlling your diabetes during the weeks before surgery. . Make sure that the doctor who takes care of your diabetes knows about your planned surgery including the date and location.  How do I manage my blood sugar before surgery? . Check your blood sugar at least 4 times a day, starting 2 days before surgery, to make sure that the level is not too high or low. o Check your blood sugar the morning of your surgery when you wake up and every 2 hours until you  get to the Short Stay unit. . If your blood sugar is less than 70 mg/dL, you will need to treat for low blood sugar: o Do not take insulin. o Treat a low blood sugar (less than 70 mg/dL) with  cup of clear juice (cranberry or apple), 4 glucose tablets, OR glucose gel. o Recheck blood sugar in 15 minutes after treatment (to make sure it is greater than 70 mg/dL). If your blood sugar is not greater than 70 mg/dL on recheck, call [phone] for further instructions. . Report your blood sugar to the short stay nurse when you get to Short Stay.  . If you are admitted to the hospital after surgery: o Your blood sugar will be checked by the staff and you will probably be given insulin after surgery (instead of oral diabetes medicines) to make sure you have good blood sugar levels. o The goal for blood sugar control after surgery is 80-180 mg/dL  WHAT DO I DO ABOUT MY DIABETES MEDICATION? Take only half dose of Lantus insulin   . Tonight take 25 units Insulin Glargine (LANTUS SOLOSTAR)     . THE MORNING OF SURGERY, take 25 units Insulin Glargine (LANTUS SOLOSTAR)  Other Instructions: Do not take  aspart (NOVOLOG) insulin the morning of surgery  Reviewed and Endorsed by [facility] Patient Education Committee, August 2015  Do not wear jewelry, make-up or nail polish.  Do not wear lotions, powders, or perfumes, or deoderant.  Do not shave 48 hours prior to surgery.  Men may shave face and neck.  Do not bring valuables to the hospital.  [facility] is not responsible for any belongings or valuables.  Contacts, dentures or bridgework may not be worn into surgery.  Leave your suitcase in the car.  After surgery it may be brought to your room. For patients admitted to the hospital, discharge time will be determined by your treatment team. Patients discharged the day of surgery will not be allowed to drive home.  Please read over the following fact sheets that you were given.

## 2017-03-22 NOTE — Progress Notes (Signed)
Pt resides at [facility]. Please have pt complete anesthesia assessment on DOS, pt not available when nurse, Rolland Porter, LPN completed the SDW-pre-op call. Nurse denies that pt C/O SOB and chest pain. Nurse stated that pt was unresponsive several days ago due to pain medication. Nurse reviewed pt medications and was faxed pre-op instructions. Nurse confirmed receipt of fax, reviewed instructions and verbalized understanding of all pre-op instructions. Nurse stated that pt last dose of Xarelto was Saturday. Anesthesia asked to review pt history (see note).

## 2017-03-23 ENCOUNTER — Inpatient Hospital Stay (HOSPITAL_COMMUNITY)
Admission: RE | Admit: 2017-03-23 | Discharge: 2017-03-28 | DRG: 616 | Disposition: A | Discharge status: Skilled Nursing Facility | Payer: Medicare Other | Source: Ambulatory Visit | Attending: Orthopedic Surgery | Admitting: Orthopedic Surgery

## 2017-03-23 ENCOUNTER — Encounter (HOSPITAL_COMMUNITY)
Admission: RE | Disposition: A | Discharge status: Skilled Nursing Facility | Payer: Self-pay | Source: Ambulatory Visit | Attending: Orthopedic Surgery

## 2017-03-23 ENCOUNTER — Inpatient Hospital Stay (HOSPITAL_COMMUNITY): Payer: Medicare Other | Admitting: Emergency Medicine

## 2017-03-23 ENCOUNTER — Encounter (HOSPITAL_COMMUNITY): Payer: Self-pay

## 2017-03-23 DIAGNOSIS — E1122 Type 2 diabetes mellitus with diabetic chronic kidney disease: Secondary | ICD-10-CM | POA: Diagnosis not present

## 2017-03-23 DIAGNOSIS — L89153 Pressure ulcer of sacral region, stage 3: Secondary | ICD-10-CM | POA: Diagnosis present

## 2017-03-23 DIAGNOSIS — I2510 Atherosclerotic heart disease of native coronary artery without angina pectoris: Secondary | ICD-10-CM | POA: Diagnosis present

## 2017-03-23 DIAGNOSIS — G4733 Obstructive sleep apnea (adult) (pediatric): Secondary | ICD-10-CM | POA: Diagnosis present

## 2017-03-23 DIAGNOSIS — S88011S Complete traumatic amputation at knee level, right lower leg, sequela: Secondary | ICD-10-CM | POA: Diagnosis not present

## 2017-03-23 DIAGNOSIS — I480 Paroxysmal atrial fibrillation: Secondary | ICD-10-CM | POA: Diagnosis not present

## 2017-03-23 DIAGNOSIS — N183 Chronic kidney disease, stage 3 (moderate): Secondary | ICD-10-CM | POA: Diagnosis not present

## 2017-03-23 DIAGNOSIS — L02611 Cutaneous abscess of right foot: Secondary | ICD-10-CM | POA: Diagnosis not present

## 2017-03-23 DIAGNOSIS — Z9842 Cataract extraction status, left eye: Secondary | ICD-10-CM | POA: Diagnosis not present

## 2017-03-23 DIAGNOSIS — M86271 Subacute osteomyelitis, right ankle and foot: Secondary | ICD-10-CM

## 2017-03-23 DIAGNOSIS — Z6841 Body Mass Index (BMI) 40.0 and over, adult: Secondary | ICD-10-CM | POA: Diagnosis not present

## 2017-03-23 DIAGNOSIS — E11621 Type 2 diabetes mellitus with foot ulcer: Secondary | ICD-10-CM | POA: Diagnosis present

## 2017-03-23 DIAGNOSIS — Z9841 Cataract extraction status, right eye: Secondary | ICD-10-CM

## 2017-03-23 DIAGNOSIS — Z87891 Personal history of nicotine dependence: Secondary | ICD-10-CM

## 2017-03-23 DIAGNOSIS — S88019A Complete traumatic amputation at knee level, unspecified lower leg, initial encounter: Secondary | ICD-10-CM | POA: Diagnosis not present

## 2017-03-23 DIAGNOSIS — Z955 Presence of coronary angioplasty implant and graft: Secondary | ICD-10-CM | POA: Diagnosis not present

## 2017-03-23 DIAGNOSIS — M86171 Other acute osteomyelitis, right ankle and foot: Secondary | ICD-10-CM | POA: Diagnosis not present

## 2017-03-23 DIAGNOSIS — E1169 Type 2 diabetes mellitus with other specified complication: Secondary | ICD-10-CM | POA: Diagnosis not present

## 2017-03-23 DIAGNOSIS — Z7982 Long term (current) use of aspirin: Secondary | ICD-10-CM

## 2017-03-23 DIAGNOSIS — R278 Other lack of coordination: Secondary | ICD-10-CM | POA: Diagnosis not present

## 2017-03-23 DIAGNOSIS — E785 Hyperlipidemia, unspecified: Secondary | ICD-10-CM | POA: Diagnosis not present

## 2017-03-23 DIAGNOSIS — Z961 Presence of intraocular lens: Secondary | ICD-10-CM | POA: Diagnosis present

## 2017-03-23 DIAGNOSIS — M86671 Other chronic osteomyelitis, right ankle and foot: Secondary | ICD-10-CM | POA: Diagnosis not present

## 2017-03-23 DIAGNOSIS — E1140 Type 2 diabetes mellitus with diabetic neuropathy, unspecified: Secondary | ICD-10-CM | POA: Diagnosis not present

## 2017-03-23 DIAGNOSIS — L03115 Cellulitis of right lower limb: Secondary | ICD-10-CM | POA: Diagnosis not present

## 2017-03-23 DIAGNOSIS — E0821 Diabetes mellitus due to underlying condition with diabetic nephropathy: Secondary | ICD-10-CM | POA: Diagnosis not present

## 2017-03-23 DIAGNOSIS — I5033 Acute on chronic diastolic (congestive) heart failure: Secondary | ICD-10-CM | POA: Diagnosis not present

## 2017-03-23 DIAGNOSIS — I130 Hypertensive heart and chronic kidney disease with heart failure and stage 1 through stage 4 chronic kidney disease, or unspecified chronic kidney disease: Secondary | ICD-10-CM | POA: Diagnosis present

## 2017-03-23 DIAGNOSIS — L8990 Pressure ulcer of unspecified site, unspecified stage: Secondary | ICD-10-CM | POA: Insufficient documentation

## 2017-03-23 DIAGNOSIS — I5032 Chronic diastolic (congestive) heart failure: Secondary | ICD-10-CM | POA: Diagnosis present

## 2017-03-23 DIAGNOSIS — L97414 Non-pressure chronic ulcer of right heel and midfoot with necrosis of bone: Secondary | ICD-10-CM | POA: Diagnosis not present

## 2017-03-23 DIAGNOSIS — L97519 Non-pressure chronic ulcer of other part of right foot with unspecified severity: Secondary | ICD-10-CM | POA: Diagnosis not present

## 2017-03-23 DIAGNOSIS — I10 Essential (primary) hypertension: Secondary | ICD-10-CM | POA: Diagnosis not present

## 2017-03-23 DIAGNOSIS — Z89512 Acquired absence of left leg below knee: Secondary | ICD-10-CM | POA: Diagnosis not present

## 2017-03-23 DIAGNOSIS — IMO0002 Reserved for concepts with insufficient information to code with codable children: Secondary | ICD-10-CM

## 2017-03-23 DIAGNOSIS — M79604 Pain in right leg: Secondary | ICD-10-CM | POA: Diagnosis not present

## 2017-03-23 DIAGNOSIS — M6281 Muscle weakness (generalized): Secondary | ICD-10-CM | POA: Diagnosis not present

## 2017-03-23 DIAGNOSIS — Z794 Long term (current) use of insulin: Secondary | ICD-10-CM

## 2017-03-23 DIAGNOSIS — Z79899 Other long term (current) drug therapy: Secondary | ICD-10-CM | POA: Diagnosis not present

## 2017-03-23 DIAGNOSIS — M79671 Pain in right foot: Secondary | ICD-10-CM | POA: Diagnosis present

## 2017-03-23 DIAGNOSIS — R6520 Severe sepsis without septic shock: Secondary | ICD-10-CM | POA: Diagnosis not present

## 2017-03-23 DIAGNOSIS — Z7409 Other reduced mobility: Secondary | ICD-10-CM | POA: Diagnosis not present

## 2017-03-23 DIAGNOSIS — Z7901 Long term (current) use of anticoagulants: Secondary | ICD-10-CM

## 2017-03-23 DIAGNOSIS — I70234 Atherosclerosis of native arteries of right leg with ulceration of heel and midfoot: Secondary | ICD-10-CM | POA: Diagnosis not present

## 2017-03-23 HISTORY — PX: AMPUTATION: SHX166

## 2017-03-23 HISTORY — PX: BELOW KNEE LEG AMPUTATION: SUR23

## 2017-03-23 LAB — TYPE AND SCREEN
ABO/RH(D): A NEG
Antibody Screen: NEGATIVE

## 2017-03-23 LAB — PROTIME-INR
INR: 1.65
Prothrombin Time: 19.3 seconds — ABNORMAL HIGH (ref 11.4–15.2)

## 2017-03-23 LAB — GLUCOSE, CAPILLARY
GLUCOSE-CAPILLARY: 291 mg/dL — AB (ref 65–99)
GLUCOSE-CAPILLARY: 226 mg/dL — AB (ref 65–99)
Glucose-Capillary: 324 mg/dL — ABNORMAL HIGH (ref 65–99)
Glucose-Capillary: 247 mg/dL — ABNORMAL HIGH (ref 65–99)
Glucose-Capillary: 243 mg/dL — ABNORMAL HIGH (ref 65–99)

## 2017-03-23 LAB — HEMOGLOBIN A1C
HEMOGLOBIN A1C: 8.5 % — AB (ref 4.8–5.6)
MEAN PLASMA GLUCOSE: 197.25 mg/dL

## 2017-03-23 LAB — ABO/RH: ABO/RH(D): A NEG

## 2017-03-23 SURGERY — AMPUTATION BELOW KNEE
Anesthesia: General | Laterality: Right

## 2017-03-23 MED ORDER — METOLAZONE 5 MG PO TABS
5.0000 mg | ORAL_TABLET | ORAL | Status: DC
  Administered 2017-03-25 – 2017-03-28 (×2): 5 mg via ORAL
  Filled 2017-03-23 (×2): qty 1

## 2017-03-23 MED ORDER — POLYETHYLENE GLYCOL 3350 17 G PO PACK: 17.0000 g | PACK | Freq: Every day | ORAL | Status: DC | PRN

## 2017-03-23 MED ORDER — OXYCODONE HCL 5 MG/5ML PO SOLN: 5.0000 mg | Freq: Once | ORAL | Status: AC | PRN

## 2017-03-23 MED ORDER — 0.9 % SODIUM CHLORIDE (POUR BTL) OPTIME
TOPICAL | Status: DC | PRN
  Administered 2017-03-23: 1000 mL

## 2017-03-23 MED ORDER — INSULIN ASPART 100 UNIT/ML ~~LOC~~ SOLN
10.0000 [IU] | Freq: Once | SUBCUTANEOUS | Status: AC
  Administered 2017-03-23: 10 [IU] via SUBCUTANEOUS

## 2017-03-23 MED ORDER — ASPIRIN EC 81 MG PO TBEC
81.0000 mg | DELAYED_RELEASE_TABLET | Freq: Every day | ORAL | Status: DC
  Administered 2017-03-24 – 2017-03-28 (×5): 81 mg via ORAL
  Filled 2017-03-23 (×6): qty 1

## 2017-03-23 MED ORDER — MAGNESIUM CITRATE PO SOLN: 1.0000 | Freq: Once | ORAL | Status: DC | PRN

## 2017-03-23 MED ORDER — PROPOFOL 10 MG/ML IV BOLUS
INTRAVENOUS | Status: DC | PRN
  Administered 2017-03-23: 15 mg via INTRAVENOUS

## 2017-03-23 MED ORDER — OXYCODONE HCL 5 MG PO TABS
15.0000 mg | ORAL_TABLET | ORAL | Status: DC | PRN
Start: 2017-03-23 — End: 2017-03-28
  Administered 2017-03-23 – 2017-03-28 (×20): 15 mg via ORAL
  Filled 2017-03-23 (×20): qty 3

## 2017-03-23 MED ORDER — SODIUM CHLORIDE 0.9 % IV SOLN
INTRAVENOUS | Status: DC
  Administered 2017-03-23: 10:00:00 via INTRAVENOUS

## 2017-03-23 MED ORDER — SODIUM CHLORIDE 0.9 % IV SOLN
INTRAVENOUS | Status: DC
  Administered 2017-03-23: 16:00:00 via INTRAVENOUS

## 2017-03-23 MED ORDER — ONDANSETRON HCL 4 MG/2ML IJ SOLN
INTRAMUSCULAR | Status: DC | PRN
  Administered 2017-03-23: 4 mg via INTRAVENOUS

## 2017-03-23 MED ORDER — INFLUENZA VAC SPLIT HIGH-DOSE 0.5 ML IM SUSY
0.5000 mL | PREFILLED_SYRINGE | INTRAMUSCULAR | Status: DC
  Filled 2017-03-23: qty 0.5

## 2017-03-23 MED ORDER — OXYCODONE HCL 5 MG PO TABS
ORAL_TABLET | ORAL | Status: AC
  Filled 2017-03-23: qty 4

## 2017-03-23 MED ORDER — OXYCODONE HCL 5 MG PO TABS
5.0000 mg | ORAL_TABLET | Freq: Once | ORAL | Status: AC | PRN
  Administered 2017-03-23: 5 mg via ORAL

## 2017-03-23 MED ORDER — POTASSIUM CHLORIDE CRYS ER 20 MEQ PO TBCR
40.0000 meq | EXTENDED_RELEASE_TABLET | Freq: Two times a day (BID) | ORAL | Status: DC
  Administered 2017-03-23 – 2017-03-28 (×10): 40 meq via ORAL
  Filled 2017-03-23 (×10): qty 2

## 2017-03-23 MED ORDER — MIDAZOLAM HCL 2 MG/2ML IJ SOLN
INTRAMUSCULAR | Status: AC
  Filled 2017-03-23: qty 2

## 2017-03-23 MED ORDER — MIDAZOLAM HCL 5 MG/5ML IJ SOLN
INTRAMUSCULAR | Status: DC | PRN
  Administered 2017-03-23: 2 mg via INTRAVENOUS

## 2017-03-23 MED ORDER — FENTANYL CITRATE (PF) 250 MCG/5ML IJ SOLN
INTRAMUSCULAR | Status: AC
Start: 2017-03-23 — End: 2017-03-23
  Filled 2017-03-23: qty 5

## 2017-03-23 MED ORDER — ACETAMINOPHEN 325 MG PO TABS: 650.0000 mg | ORAL_TABLET | Freq: Four times a day (QID) | ORAL | Status: DC | PRN

## 2017-03-23 MED ORDER — TORSEMIDE 20 MG PO TABS
80.0000 mg | ORAL_TABLET | Freq: Two times a day (BID) | ORAL | Status: DC
  Administered 2017-03-23 – 2017-03-28 (×10): 80 mg via ORAL
  Filled 2017-03-23 (×10): qty 4

## 2017-03-23 MED ORDER — RIVAROXABAN 20 MG PO TABS
20.0000 mg | ORAL_TABLET | Freq: Every day | ORAL | Status: DC
  Administered 2017-03-23 – 2017-03-28 (×6): 20 mg via ORAL
  Filled 2017-03-23 (×6): qty 1

## 2017-03-23 MED ORDER — ONDANSETRON HCL 4 MG/2ML IJ SOLN: 4.0000 mg | Freq: Four times a day (QID) | INTRAMUSCULAR | Status: DC | PRN

## 2017-03-23 MED ORDER — DOCUSATE SODIUM 100 MG PO CAPS: 100.0000 mg | ORAL_CAPSULE | Freq: Every day | ORAL | Status: DC

## 2017-03-23 MED ORDER — HYDROMORPHONE HCL 1 MG/ML IJ SOLN
INTRAMUSCULAR | Status: AC
  Filled 2017-03-23: qty 1

## 2017-03-23 MED ORDER — METHOCARBAMOL 500 MG PO TABS
500.0000 mg | ORAL_TABLET | Freq: Four times a day (QID) | ORAL | Status: DC | PRN
  Administered 2017-03-23 – 2017-03-28 (×6): 500 mg via ORAL
  Filled 2017-03-23 (×6): qty 1

## 2017-03-23 MED ORDER — ACETAMINOPHEN 650 MG RE SUPP: 650.0000 mg | Freq: Four times a day (QID) | RECTAL | Status: DC | PRN

## 2017-03-23 MED ORDER — SUCCINYLCHOLINE CHLORIDE 200 MG/10ML IV SOSY
PREFILLED_SYRINGE | INTRAVENOUS | Status: DC | PRN
  Administered 2017-03-23: 120 mg via INTRAVENOUS

## 2017-03-23 MED ORDER — METHOCARBAMOL 500 MG PO TABS
ORAL_TABLET | ORAL | Status: AC
  Filled 2017-03-23: qty 1

## 2017-03-23 MED ORDER — ONDANSETRON HCL 4 MG PO TABS: 4.0000 mg | ORAL_TABLET | Freq: Four times a day (QID) | ORAL | Status: DC | PRN

## 2017-03-23 MED ORDER — CEFAZOLIN SODIUM-DEXTROSE 1-4 GM/50ML-% IV SOLN
INTRAVENOUS | Status: DC | PRN
  Administered 2017-03-23: 3 g via INTRAVENOUS

## 2017-03-23 MED ORDER — BISACODYL 10 MG RE SUPP: 10.0000 mg | Freq: Every day | RECTAL | Status: DC | PRN

## 2017-03-23 MED ORDER — METOPROLOL TARTRATE 50 MG PO TABS
50.0000 mg | ORAL_TABLET | Freq: Two times a day (BID) | ORAL | Status: DC
  Administered 2017-03-23 – 2017-03-28 (×10): 50 mg via ORAL
  Filled 2017-03-23 (×10): qty 1

## 2017-03-23 MED ORDER — HYDROMORPHONE HCL 1 MG/ML IJ SOLN
1.0000 mg | INTRAMUSCULAR | Status: DC | PRN
  Administered 2017-03-23 – 2017-03-28 (×22): 1 mg via INTRAVENOUS
  Filled 2017-03-23 (×23): qty 1

## 2017-03-23 MED ORDER — CEFAZOLIN SODIUM-DEXTROSE 2-4 GM/100ML-% IV SOLN
2.0000 g | Freq: Four times a day (QID) | INTRAVENOUS | Status: AC
  Administered 2017-03-23 – 2017-03-24 (×3): 2 g via INTRAVENOUS
  Filled 2017-03-23 (×3): qty 100

## 2017-03-23 MED ORDER — DOCUSATE SODIUM 100 MG PO CAPS
100.0000 mg | ORAL_CAPSULE | Freq: Two times a day (BID) | ORAL | Status: DC
  Administered 2017-03-23 – 2017-03-28 (×6): 100 mg via ORAL
  Filled 2017-03-23 (×8): qty 1

## 2017-03-23 MED ORDER — LIDOCAINE 2% (20 MG/ML) 5 ML SYRINGE
INTRAMUSCULAR | Status: DC | PRN
  Administered 2017-03-23: 100 mg via INTRAVENOUS

## 2017-03-23 MED ORDER — INSULIN ASPART 100 UNIT/ML ~~LOC~~ SOLN
0.0000 [IU] | Freq: Three times a day (TID) | SUBCUTANEOUS | Status: DC
  Administered 2017-03-23 – 2017-03-24 (×2): 7 [IU] via SUBCUTANEOUS
  Administered 2017-03-24: 4 [IU] via SUBCUTANEOUS
  Administered 2017-03-24 – 2017-03-25 (×3): 11 [IU] via SUBCUTANEOUS
  Administered 2017-03-25: 7 [IU] via SUBCUTANEOUS
  Administered 2017-03-26 (×2): 20 [IU] via SUBCUTANEOUS
  Administered 2017-03-26 – 2017-03-27 (×2): 15 [IU] via SUBCUTANEOUS
  Administered 2017-03-27 – 2017-03-28 (×4): 20 [IU] via SUBCUTANEOUS
  Administered 2017-03-28: 7 [IU] via SUBCUTANEOUS

## 2017-03-23 MED ORDER — POLYETHYLENE GLYCOL 3350 17 G PO PACK
17.0000 g | PACK | Freq: Every day | ORAL | Status: DC
  Administered 2017-03-23 – 2017-03-28 (×5): 17 g via ORAL
  Filled 2017-03-23 (×5): qty 1

## 2017-03-23 MED ORDER — DILTIAZEM HCL ER COATED BEADS 240 MG PO CP24
240.0000 mg | ORAL_CAPSULE | Freq: Every day | ORAL | Status: DC
  Administered 2017-03-24 – 2017-03-28 (×4): 240 mg via ORAL
  Filled 2017-03-23 (×5): qty 1

## 2017-03-23 MED ORDER — METOCLOPRAMIDE HCL 5 MG PO TABS: 5.0000 mg | ORAL_TABLET | Freq: Three times a day (TID) | ORAL | Status: DC | PRN

## 2017-03-23 MED ORDER — INSULIN ASPART 100 UNIT/ML ~~LOC~~ SOLN
5.0000 [IU] | Freq: Once | SUBCUTANEOUS | Status: AC
  Administered 2017-03-23: 5 [IU] via INTRAVENOUS

## 2017-03-23 MED ORDER — METOCLOPRAMIDE HCL 5 MG/ML IJ SOLN: 5.0000 mg | Freq: Three times a day (TID) | INTRAMUSCULAR | Status: DC | PRN

## 2017-03-23 MED ORDER — FENTANYL CITRATE (PF) 100 MCG/2ML IJ SOLN
INTRAMUSCULAR | Status: DC | PRN
  Administered 2017-03-23 (×2): 50 ug via INTRAVENOUS
  Administered 2017-03-23: 100 ug via INTRAVENOUS
  Administered 2017-03-23: 50 ug via INTRAVENOUS

## 2017-03-23 MED ORDER — SPIRONOLACTONE 25 MG PO TABS
25.0000 mg | ORAL_TABLET | Freq: Every day | ORAL | Status: DC
  Administered 2017-03-23 – 2017-03-28 (×6): 25 mg via ORAL
  Filled 2017-03-23 (×6): qty 1

## 2017-03-23 MED ORDER — METHOCARBAMOL 1000 MG/10ML IJ SOLN
500.0000 mg | Freq: Four times a day (QID) | INTRAVENOUS | Status: DC | PRN
  Filled 2017-03-23: qty 5

## 2017-03-23 MED ORDER — HYDROMORPHONE HCL 1 MG/ML IJ SOLN
0.2500 mg | INTRAMUSCULAR | Status: DC | PRN
  Administered 2017-03-23: 1 mg via INTRAVENOUS
  Administered 2017-03-23 (×2): 0.5 mg via INTRAVENOUS

## 2017-03-23 MED ORDER — INSULIN GLARGINE 100 UNIT/ML ~~LOC~~ SOLN
30.0000 [IU] | Freq: Every day | SUBCUTANEOUS | Status: DC
  Administered 2017-03-23 – 2017-03-24 (×2): 30 [IU] via SUBCUTANEOUS
  Filled 2017-03-23 (×2): qty 0.3

## 2017-03-23 MED ORDER — INSULIN ASPART 100 UNIT/ML ~~LOC~~ SOLN
6.0000 [IU] | Freq: Three times a day (TID) | SUBCUTANEOUS | Status: DC
  Administered 2017-03-23 – 2017-03-28 (×13): 6 [IU] via SUBCUTANEOUS

## 2017-03-23 MED ORDER — PROPOFOL 10 MG/ML IV BOLUS
INTRAVENOUS | Status: AC
  Filled 2017-03-23: qty 40

## 2017-03-23 MED ORDER — ATORVASTATIN CALCIUM 80 MG PO TABS
80.0000 mg | ORAL_TABLET | Freq: Every day | ORAL | Status: DC
Start: 2017-03-23 — End: 2017-03-28
  Administered 2017-03-23 – 2017-03-28 (×5): 80 mg via ORAL
  Filled 2017-03-23 (×6): qty 1

## 2017-03-23 MED ORDER — GABAPENTIN 300 MG PO CAPS
600.0000 mg | ORAL_CAPSULE | Freq: Two times a day (BID) | ORAL | Status: DC
  Administered 2017-03-23 – 2017-03-28 (×9): 600 mg via ORAL
  Filled 2017-03-23 (×10): qty 2

## 2017-03-23 SURGICAL SUPPLY — 41 items
BLADE SAW RECIP 87.9 MT (BLADE) ×3 IMPLANT
BLADE SURG 21 STRL SS (BLADE) ×3 IMPLANT
BNDG COHESIVE 6X5 TAN STRL LF (GAUZE/BANDAGES/DRESSINGS) ×3 IMPLANT
BNDG GAUZE ELAST 4 BULKY (GAUZE/BANDAGES/DRESSINGS) IMPLANT
COVER SURGICAL LIGHT HANDLE (MISCELLANEOUS) ×3 IMPLANT
CUFF TOURNIQUET SINGLE 34IN LL (TOURNIQUET CUFF) IMPLANT
CUFF TOURNIQUET SINGLE 44IN (TOURNIQUET CUFF) ×3 IMPLANT
DRAPE INCISE IOBAN 66X45 STRL (DRAPES) ×6 IMPLANT
DRAPE U-SHAPE 47X51 STRL (DRAPES) ×3 IMPLANT
DRESSING PREVENA PLUS CUSTOM (GAUZE/BANDAGES/DRESSINGS) ×1 IMPLANT
DRSG PREVENA PLUS CUSTOM (GAUZE/BANDAGES/DRESSINGS) ×3
DRSG VAC ATS LRG SENSATRAC (GAUZE/BANDAGES/DRESSINGS) ×3 IMPLANT
DRSG VAC ATS MED SENSATRAC (GAUZE/BANDAGES/DRESSINGS) IMPLANT
ELECT REM PT RETURN 9FT ADLT (ELECTROSURGICAL) ×3
ELECTRODE REM PT RTRN 9FT ADLT (ELECTROSURGICAL) ×1 IMPLANT
GLOVE BIOGEL PI IND STRL 7.5 (GLOVE) ×1 IMPLANT
GLOVE BIOGEL PI IND STRL 9 (GLOVE) ×1 IMPLANT
GLOVE BIOGEL PI INDICATOR 7.5 (GLOVE) ×2
GLOVE BIOGEL PI INDICATOR 9 (GLOVE) ×2
GLOVE SURG ORTHO 9.0 STRL STRW (GLOVE) ×3 IMPLANT
GLOVE SURG SS PI 6.5 STRL IVOR (GLOVE) ×3 IMPLANT
GOWN STRL REUS W/ TWL XL LVL3 (GOWN DISPOSABLE) ×3 IMPLANT
GOWN STRL REUS W/TWL XL LVL3 (GOWN DISPOSABLE) ×6
KIT BASIN OR (CUSTOM PROCEDURE TRAY) ×3 IMPLANT
KIT ROOM TURNOVER OR (KITS) ×3 IMPLANT
MANIFOLD NEPTUNE II (INSTRUMENTS) ×3 IMPLANT
NS IRRIG 1000ML POUR BTL (IV SOLUTION) ×3 IMPLANT
PACK ORTHO EXTREMITY (CUSTOM PROCEDURE TRAY) ×3 IMPLANT
PAD ARMBOARD 7.5X6 YLW CONV (MISCELLANEOUS) ×6 IMPLANT
SPONGE LAP 18X18 X RAY DECT (DISPOSABLE) IMPLANT
STAPLER VISISTAT 35W (STAPLE) IMPLANT
STOCKINETTE IMPERVIOUS LG (DRAPES) ×3 IMPLANT
SUT ETHILON 2 0 PSLX (SUTURE) ×6 IMPLANT
SUT SILK 2 0 (SUTURE) ×2
SUT SILK 2-0 18XBRD TIE 12 (SUTURE) ×1 IMPLANT
SUT VIC AB 1 CTX 27 (SUTURE) IMPLANT
TOWEL OR 17X26 10 PK STRL BLUE (TOWEL DISPOSABLE) ×3 IMPLANT
TUBE CONNECTING 12'X1/4 (SUCTIONS) ×1
TUBE CONNECTING 12X1/4 (SUCTIONS) ×2 IMPLANT
WND VAC CANISTER 500ML (MISCELLANEOUS) ×3 IMPLANT
YANKAUER SUCT BULB TIP NO VENT (SUCTIONS) ×3 IMPLANT

## 2017-03-23 NOTE — Transfer of Care (Signed)
Immediate Anesthesia Transfer of Care Note  Patient: [name]  Procedure(s) Performed: Procedure(s): RIGHT BELOW KNEE AMPUTATION (Right)  Patient Location: PACU  Anesthesia Type:General  Level of Consciousness: awake, oriented and patient cooperative  Airway & Oxygen Therapy: Patient Spontanous Breathing and Patient connected to nasal cannula oxygen  Post-op Assessment: Report given to RN and Post -op Vital signs reviewed and stable  Post vital signs: Reviewed and stable  Last Vitals:  Vitals:   03/23/17 0829  BP: (!) 119/50  Pulse: 82  Temp: 36.6 C  SpO2: 99%    Last Pain:  Vitals:   03/23/17 0923  TempSrc:   PainSc: 9          Complications: No apparent anesthesia complications

## 2017-03-23 NOTE — Anesthesia Procedure Notes (Signed)
Procedure Name: Intubation Date/Time: 03/23/2017 11:50 AM Performed by: Melina Copa, Jamonta Goerner R Pre-anesthesia Checklist: Patient identified, Emergency Drugs available, Patient being monitored and Suction available Patient Re-evaluated:Patient Re-evaluated prior to induction Oxygen Delivery Method: Circle system utilized Preoxygenation: Pre-oxygenation with 100% oxygen Induction Type: IV induction Ventilation: Mask ventilation without difficulty and Oral airway inserted - appropriate to patient size Laryngoscope Size: Mac and 4 Grade View: Grade I Tube type: Oral Tube size: 8.0 mm Number of attempts: 1 Airway Equipment and Method: Stylet Placement Confirmation: ETT inserted through vocal cords under direct vision,  positive ETCO2 and breath sounds checked- equal and bilateral Secured at: 23 cm Tube secured with: Tape Dental Injury: Teeth and Oropharynx as per pre-operative assessment

## 2017-03-23 NOTE — H&P (Signed)
[name] is an 66 y.o. male.   Chief Complaint: Abscess osteomyelitis right foot HPI: Patient is a 66 year old gentleman with diabetic insensate neuropathy who was previously admitted recommended proceeding with transtibial amputation. Patient would not agree to proceeding with surgery at that time and presents at this time for transtibial amputation.  Past Medical History:  Diagnosis Date  . Arthritis    "hands and lower back" (09/19/2014)  . CAD (coronary artery disease)    a. s/p PCI to RCA in 2012. b. prior cath in 01/2014 with elevated L/RH pressures, mild-mod CAD of LAD/LCx with patent RCA, normal EF, c/b CIN/CHF.  [name] Carpal tunnel syndrome, bilateral   . Cellulitis   . Chronic diastolic CHF (congestive heart failure) (HCC)   . Chronic kidney disease (CKD), stage III (moderate)   . Chronic lower back pain   . Diabetes (HCC)   . DKA (diabetic ketoacidoses) (HCC) 03/2017  . History of blood transfusion ~ 1954   "related to OR"  . Hyperlipidemia   . Hypertension   . Hypoxia    a. Qualified for home O2 at DC in 09/2014.  [name] Lower GI bleed   . Microcytic anemia   . Morbid obesity (HCC)   . Neuropathy   . OSA (obstructive sleep apnea)    "I wear nasal prongs; haven't been using prongs recently" (09/19/2014)  . PAF (paroxysmal atrial fibrillation) (HCC)    TEE DCCV 09/23/2014  . Physical deconditioning   . Pilonidal cyst 1980's; 01/25/2013  . Scrotal abscess   . Type II diabetes mellitus (HCC)     Past Surgical History:  Procedure Laterality Date  . ABDOMINAL SURGERY  ~ 1954   BENIGN TUMOR REMOVED  . APPENDECTOMY    . CARDIOVERSION  2010   [name] 09/19/2014  . CARDIOVERSION N/A 09/23/2014   Procedure: CARDIOVERSION;  Surgeon: Lewayne Bunting, MD;  Location: [facility] ENDOSCOPY;  Service: Cardiovascular;  Laterality: N/A;  . CARDIOVERSION N/A 12/11/2014   Procedure: CARDIOVERSION;  Surgeon: Thurmon Fair, MD;  Location: [facility] ENDOSCOPY;  Service: Cardiovascular;  Laterality: N/A;  .  CATARACT EXTRACTION W/PHACO Right 11/15/2012   Procedure: CATARACT EXTRACTION PHACO AND INTRAOCULAR LENS PLACEMENT (IOC);  Surgeon: Shade Flood, MD;  Location: [facility] OR;  Service: Ophthalmology;  Laterality: Right;  . CATARACT EXTRACTION W/PHACO Left 11/29/2012   Procedure: CATARACT EXTRACTION PHACO AND INTRAOCULAR LENS PLACEMENT (IOC);  Surgeon: Shade Flood, MD;  Location: [facility] OR;  Service: Ophthalmology;  Laterality: Left;  . CORONARY ANGIOPLASTY WITH STENT PLACEMENT  August 2012   RCA DES - [facility]  . DEBRIDEMENT  FOOT Left    debriding diabetic foot ulcers  . EYE SURGERY    . FOREIGN BODY REMOVAL Right 2014   heel,  splinter removal   . LEFT AND RIGHT HEART CATHETERIZATION WITH CORONARY ANGIOGRAM N/A 01/31/2014   Procedure: LEFT AND RIGHT HEART CATHETERIZATION WITH CORONARY ANGIOGRAM;  Surgeon: Micheline Chapman, MD;  Location: [facility] CATH LAB;  Service: Cardiovascular;  Laterality: N/A;  . PARS PLANA VITRECTOMY Left 06/05/2013   Procedure: PARS PLANA VITRECTOMY WITH 23 GAUGE with Endolaser(constellation);  Surgeon: Shade Flood, MD;  Location: [facility] OR;  Service: Ophthalmology;  Laterality: Left;  . PILONIDAL CYST EXCISION N/A 01/08/2013   Procedure: CYST EXCISION PILONIDAL EXTENSIVE;  Surgeon: Axel Filler, MD;  Location: [facility] OR;  Service: General;  Laterality: N/A;  . PILONIDAL CYST EXCISION  1980's   "in [place]"  . TEE WITHOUT CARDIOVERSION N/A 09/23/2014   Procedure: TRANSESOPHAGEAL  ECHOCARDIOGRAM (TEE);  Surgeon: Lewayne Bunting, MD;  Location: [facility] ENDOSCOPY;  Service: Cardiovascular;  Laterality: N/A;  . TONSILLECTOMY      Family History  Problem Relation Age of Onset  . Adopted: Yes  . Other Other        PT ADOPTED   Social History:  reports that he quit smoking about 33 years ago. His smoking use included Cigarettes. He has a 20.00 pack-year smoking history. He has never used smokeless tobacco. He reports that he does not drink alcohol or use  drugs.  Allergies: No Known Allergies  Medications Prior to Admission  Medication Sig Dispense Refill  . acetaminophen (TYLENOL) 500 MG tablet Take 1,000 mg by mouth 3 (three) times daily.    [name] amoxicillin-clavulanate (AUGMENTIN) 875-125 MG tablet Take 1 tablet by mouth every 12 (twelve) hours. (Patient taking differently: Take 1 tablet by mouth every 12 (twelve) hours. For 12 days) 24 tablet 0  . aspirin EC 81 MG EC tablet Take 1 tablet (81 mg total) by mouth daily. 30 tablet 2  . atorvastatin (LIPITOR) 80 MG tablet Take 1 tablet (80 mg total) by mouth daily. (Patient taking differently: Take 80 mg by mouth at bedtime. ) 30 tablet 10  . diltiazem (CARDIZEM CD) 240 MG 24 hr capsule Take 1 capsule (240 mg total) by mouth daily.    [name] docusate sodium (COLACE) 100 MG capsule Take 1 capsule (100 mg total) by mouth daily.    [name] doxycycline (DORYX) 100 MG EC tablet Take 100 mg by mouth 2 (two) times daily.    [name] gabapentin (NEURONTIN) 300 MG capsule Take 2 capsules (600 mg total) by mouth 2 (two) times daily. 60 capsule 3  . hydrocerin (EUCERIN) CREA Apply 1 application topically 3 (three) times daily. legs 454 g 3  . insulin aspart (NOVOLOG) 100 UNIT/ML injection Inject 10 Units into the skin See admin instructions. Inject 10 units subcutaneously after meals if BS is greater than 150 and eats greater than 50% meal    . Insulin Glargine (LANTUS SOLOSTAR) 100 UNIT/ML Solostar Pen Inject 50 Units into the skin daily.     [name] KLOR-CON M20 20 MEQ tablet TAKE 2 TABLETS BY MOUTH TWICE A DAY (Patient taking differently: TAKE 40 MEQ BY MOUTH TWICE A DAY) 120 tablet 3  . metolazone (ZAROXOLYN) 5 MG tablet Take 5mg  twice weekly on Mondays and Fridays. (Patient taking differently: Take 5 mg by mouth See admin instructions. Take 5mg  twice weekly on Mondays and Fridays.) 10 tablet 11  . metoprolol tartrate (LOPRESSOR) 50 MG tablet Take 1 tablet (50 mg total) by mouth 2 (two) times daily. 60 tablet 0  . Multiple Vitamin  (MULTIVITAMIN WITH MINERALS) TABS Take 1 tablet by mouth daily.    10-08-1973 oxyCODONE (ROXICODONE) 15 MG immediate release tablet Take 1 tablet (15 mg total) by mouth every 4 (four) hours. Hold for Sedation (Patient taking differently: Take 15 mg by mouth every 6 (six) hours as needed for pain. Hold for Sedation) 120 tablet 0  . OXYGEN Inhale 2-3 L into the lungs continuous.    . polyethylene glycol (MIRALAX / GLYCOLAX) packet Take 17 g by mouth daily. 14 each 0  . rivaroxaban (XARELTO) 20 MG TABS tablet Take 20 mg by mouth daily with supper.    11-29-1989 spironolactone (ALDACTONE) 25 MG tablet Take 25 mg by mouth daily.    [name] torsemide (DEMADEX) 20 MG tablet Take 4 tablets (80 mg total) by mouth 2 (two) times daily.  240 tablet 11  . Hypromellose (NATURAL BALANCE TEARS OP) Place 1 drop into both eyes daily as needed (for dry eyes).       Results for orders placed or performed during the hospital encounter of 03/23/17 (from the past 48 hour(s))  Glucose, capillary     Status: Abnormal   Collection Time: 03/23/17  8:33 AM  Result Value Ref Range   Glucose-Capillary 291 (H) 65 - 99 mg/dL  Hemoglobin I0X     Status: Abnormal   Collection Time: 03/23/17  9:37 AM  Result Value Ref Range   Hgb A1c MFr Bld 8.5 (H) 4.8 - 5.6 %    Comment: (NOTE) Pre diabetes:          5.7%-6.4% Diabetes:              >6.4% Glycemic control for   <7.0% adults with diabetes    Mean Plasma Glucose 197.25 mg/dL   No results found.  Review of Systems  All other systems reviewed and are negative.   Blood pressure (!) 119/50, pulse 82, temperature 97.9 F (36.6 C), temperature source Oral, SpO2 99 %. Physical Exam  On examination patient has swelling cellulitis ulceration osteomyelitis of the right foot. Assessment/Plan Assessment: Diabetic insensate neuropathy with osteomyelitis ulceration cellulitis right foot.  Plan: We'll plan for right transtibial amputation. Risk and benefits were discussed including risk of the wound  not healing. Patient states he understands wishes to proceed at this time.  Nadara Mustard, MD 03/23/2017, 10:19 AM

## 2017-03-23 NOTE — Anesthesia Preprocedure Evaluation (Addendum)
Anesthesia Evaluation  Patient identified by MRN, date of birth, ID band Patient awake    Reviewed: Allergy & Precautions, NPO status , Patient's Chart, lab work & pertinent test results  Airway Mallampati: II  TM Distance: <3 FB Neck ROM: Limited    Dental  (+) Edentulous Upper   Pulmonary shortness of breath, sleep apnea , former smoker,     + decreased breath sounds      Cardiovascular hypertension, + CAD, +CHF and + DOE   Rhythm:Regular Rate:Normal     Neuro/Psych  Neuromuscular disease    GI/Hepatic   Endo/Other  diabetes, Poorly ControlledMorbid obesitymassively obese and very poor diabetic control  Renal/GU      Musculoskeletal  (+) Arthritis ,   Abdominal (+) + obese,   Peds  Hematology  (+) Blood dyscrasia, anemia ,   Anesthesia Other Findings   Reproductive/Obstetrics                            Anesthesia Physical Anesthesia Plan  ASA: IV  Anesthesia Plan: General   Post-op Pain Management:    Induction: Intravenous  PONV Risk Score and Plan: 3 and Ondansetron, Dexamethasone, Midazolam, Propofol infusion and Treatment may vary due to age or medical condition  Airway Management Planned: Oral ETT  Additional Equipment:   Intra-op Plan:   Post-operative Plan: Extubation in OR and Possible Post-op intubation/ventilation  Informed Consent: I have reviewed the patients History and Physical, chart, labs and discussed the procedure including the risks, benefits and alternatives for the proposed anesthesia with the patient or authorized representative who has indicated his/her understanding and acceptance.   Dental advisory given  Plan Discussed with: CRNA  Anesthesia Plan Comments:         Anesthesia Quick Evaluation

## 2017-03-23 NOTE — Op Note (Signed)
   Date of Surgery: 03/23/2017  INDICATIONS: [name] is a 65 y.o.-year-old male who has ulceration osteomyelitis and abscess right foot.  PREOPERATIVE DIAGNOSIS: Abscess osteomyelitis ulceration right foot  POSTOPERATIVE DIAGNOSIS: Same.  PROCEDURE: Transtibial amputation Application of Prevena wound VAC  SURGEON: Lajoyce Corners, M.D.  ANESTHESIA:  general  IV FLUIDS AND URINE: See anesthesia.  ESTIMATED BLOOD LOSS: min mL.  COMPLICATIONS: None.  DESCRIPTION OF PROCEDURE: The patient was brought to the operating room and underwent a general anesthetic. After adequate levels of anesthesia were obtained patient's lower extremity was prepped using DuraPrep draped into a sterile field. A timeout was called. The foot was draped out of the sterile field with impervious stockinette. A transverse incision was made 11 cm distal to the tibial tubercle. This curved proximally and a large posterior flap was created. The tibia was transected 1 cm proximal to the skin incision. The fibula was transected just proximal to the tibial incision. The tibia was beveled anteriorly. A large posterior flap was created. The sciatic nerve was pulled cut and allowed to retract. The vascular bundles were suture ligated with 2-0 silk. The deep and superficial fascial layers were closed using #1 Vicryl. The skin was closed using staples and 2-0 nylon. The wound was covered with a Prevena wound VAC. There was a good suction fit. Patient was extubated taken to the PACU in stable condition.  Paul Baker, MD [facility] 12:39 PM

## 2017-03-23 NOTE — Interval H&P Note (Signed)
History and Physical Interval Note:  03/23/2017 10:19 AM  [name]  has presented today for surgery, with the diagnosis of Osteomyelitis/Abscess Right Foot  The various methods of treatment have been discussed with the patient and family. After consideration of risks, benefits and other options for treatment, the patient has consented to  Procedure(s): RIGHT BELOW KNEE AMPUTATION (Right) as a surgical intervention .  The patient's history has been reviewed, patient examined, no change in status, stable for surgery.  I have reviewed the patient's chart and labs.  Questions were answered to the patient's satisfaction.     Nadara Mustard

## 2017-03-23 NOTE — H&P (View-Only) (Signed)
Patient ID: [name], male   DOB: 06/19/1951, 66 y.o.   MRN: [id number] Patient canceled his surgery this morning. When I saw him at bedside he was drinking ginger ale and eating Jell-O. Patient states that he will not proceed with surgery today he would like to proceed at a different date. I discussed that this is AGAINST MEDICAL ADVICE. Discussed that the next time I can get him on the schedule would be Wednesday. Discussed that patient could potentially die from the infection in his foot with delaying surgery. Patient states he understands and stated that he would rather die than have surgery today. I will reevaluate on Monday. 

## 2017-03-23 NOTE — Care Management Note (Signed)
Case Management Note  Patient Details  Name: [name] MRN: [id number] Date of Birth: 08-09-1950  Subjective/Objective:                 Patient Dc'd 9/7 to [facility] SNF for skilled nursing. Returned for scheduled Sx 9/12. Transtibial amputation and Application of Prevena wound VAC by Dr Lajoyce Corners. Patient previously lived in trailer with sister.    Action/Plan:  CM will continue to follow for DC planning.  Expected Discharge Date:                  Expected Discharge Plan:     In-House Referral:     Discharge planning Services  CM Consult  Post Acute Care Choice:    Choice offered to:     DME Arranged:    DME Agency:     HH Arranged:    HH Agency:     Status of Service:  In process, will continue to follow  If discussed at Long Length of Stay Meetings, dates discussed:    Additional Comments:  Lawerance Sabal, RN 03/23/2017, 3:33 PM

## 2017-03-24 ENCOUNTER — Encounter (HOSPITAL_COMMUNITY): Payer: Self-pay | Admitting: Orthopedic Surgery

## 2017-03-24 DIAGNOSIS — L8990 Pressure ulcer of unspecified site, unspecified stage: Secondary | ICD-10-CM | POA: Insufficient documentation

## 2017-03-24 LAB — GLUCOSE, CAPILLARY
GLUCOSE-CAPILLARY: 190 mg/dL — AB (ref 65–99)
GLUCOSE-CAPILLARY: 198 mg/dL — AB (ref 65–99)
GLUCOSE-CAPILLARY: 223 mg/dL — AB (ref 65–99)
GLUCOSE-CAPILLARY: 250 mg/dL — AB (ref 65–99)
GLUCOSE-CAPILLARY: 261 mg/dL — AB (ref 65–99)

## 2017-03-24 NOTE — Anesthesia Postprocedure Evaluation (Signed)
Anesthesia Post Note  Patient: [name]  Procedure(s) Performed: Procedure(s) (LRB): RIGHT BELOW KNEE AMPUTATION (Right)     Patient location during evaluation: PACU Anesthesia Type: General Level of consciousness: awake and sedated Pain management: pain level controlled Vital Signs Assessment: post-procedure vital signs reviewed and stable Respiratory status: spontaneous breathing, nonlabored ventilation, respiratory function stable and patient connected to nasal cannula oxygen Cardiovascular status: blood pressure returned to baseline and stable Postop Assessment: no signs of nausea or vomiting Anesthetic complications: no    Last Vitals:  Vitals:   03/24/17 0028 03/24/17 0442  BP: 126/65 (!) 110/58  Pulse: 82 92  Resp: 16 16  Temp: 37 C 37.3 C  SpO2: 93% 96%    Last Pain:  Vitals:   03/24/17 0612  TempSrc:   PainSc: Asleep                 [name],JAMES TERRILL

## 2017-03-24 NOTE — Discharge Instructions (Signed)

## 2017-03-24 NOTE — Progress Notes (Signed)
Inpatient Diabetes Program Recommendations  AACE/ADA: New Consensus Statement on Inpatient Glycemic Control (2015)  Target Ranges:  Prepandial:   less than 140 mg/dL      Peak postprandial:   less than 180 mg/dL (1-2 hours)      Critically ill patients:  140 - 180 mg/dL   Results for [name], [name] (MRN [id number]) as of 03/24/2017 10:54  Ref. Range 03/23/2017 10:29 03/23/2017 12:39 03/23/2017 15:18 03/23/2017 20:58 03/24/2017 06:50  Glucose-Capillary Latest Ref Range: 65 - 99 mg/dL 620 (H) 355 (H) 974 (H) 226 (H) 261 (H)   Review of Glycemic Control  Diabetes history: DM 2 Outpatient Diabetes medications: Lantus 50 units Daily, Novolog 10 units tid meal coverage Current orders for Inpatient glycemic control: Lantus 30 units, Novolog Resistant 0-20 units tid, Novolog 6 units tid meal coverage  Inpatient Diabetes Program Recommendations:    Glucose elevated this am at 261 mg/dl after Lantus 30 units given. Patient on more basal insulin at home. Consider increasing Lantus to 38 units.  Thanks,  Christena Deem RN, MSN, [facility] Inpatient Diabetes Coordinator Team Pager [phone] (8a-5p)

## 2017-03-24 NOTE — Progress Notes (Signed)
Patient ID: [name], male   DOB: [birth year], 66 y.o.   MRN: [id number] Postoperative day 1 right below the knee amputation. Patient is resting well the wound VAC pump is not plugged in will transition to the Prevena pump. Plan for discharge to skilled nursing.

## 2017-03-24 NOTE — Evaluation (Signed)
Physical Therapy Evaluation Patient Details Name: [name] MRN: [id number] DOB: [birth year] Today's Date: 03/24/2017   History of Present Illness  Pt is a 66 y.o. male admitted with diabetic insensate neuropathy now s/p R transtibial amputation on 03/23/17. Pertinent PMH includes arthritis, CAD, CHF, CKD, chronic LBP, DM, HTN, OSA, obesity, paroxysmal a-fib.    Clinical Impression  Pt presents with an overall decrease in functional mobility secondary to above. PTA, pt reports mod indep with RW and lives at home with sister. Pt unwilling to perform EOB or OOB mobility today secondary to c/o 9/10 pain "all over" despite max encouragement. Educated pt on importance of mobility and continued rehab to maximize independence. Pt required minA for rolling R/L and maxA to reposition in bed. Educ on BLE therex to complete while laying in bed, which pt reports he will try later. Pt would benefit from continued acute PT services to maximize functional mobility and independence prior to d/c with SNF-level therapies.     Follow Up Recommendations SNF    Equipment Recommendations  Other (comment) (defer to next venue)    Recommendations for Other Services       Precautions / Restrictions Precautions Precautions: Fall;Other (comment) Precaution Comments: Bariatric; R BKA      Mobility  Bed Mobility Overal bed mobility: Needs Assistance Bed Mobility: Rolling Rolling: Min assist;Max assist         General bed mobility comments: Pt minA to roll R/L for repositioning, but maxA to scoot up in bed with use of BUEs (unwilling to move either LE for repositioning). Recommend assist+2 for further mobility  Transfers                 General transfer comment: Pt unwilling to sit up in bed  Ambulation/Gait             General Gait Details: Unable  Stairs            Wheelchair Mobility    Modified Rankin (Stroke Patients Only)       Balance                                              Pertinent Vitals/Pain Pain Assessment: 0-10 Pain Score: 9  Pain Location: R leg, lower back, "all over" Pain Descriptors / Indicators: Constant;Discomfort;Sore;Grimacing Pain Intervention(s): Monitored during session;Limited activity within patient's tolerance;Premedicated before session    Home Living Family/patient expects to be discharged to:: Skilled nursing facility Living Arrangements: Other relatives (Sister)               Additional Comments: Pt lives with sister.     Prior Function           Comments: Pt is retired Nurse, adult. Not forthcoming with details regarding PLOF, but reports mod indep with RW and indep with ADLs. Recently "had to stop driving"     Hand Dominance        Extremity/Trunk Assessment   Upper Extremity Assessment Upper Extremity Assessment: Generalized weakness    Lower Extremity Assessment Lower Extremity Assessment: Generalized weakness;RLE deficits/detail;LLE deficits/detail RLE Deficits / Details: s/p R BKA - pt unwilling to move RLE RLE: Unable to fully assess due to pain LLE Deficits / Details: LLE grossly 3/5 (pt reports chronic bilat knee issues) LLE: Unable to fully assess due to pain       Communication  Communication: No difficulties  Cognition Arousal/Alertness: Awake/alert Behavior During Therapy: WFL for tasks assessed/performed Overall Cognitive Status: Within Functional Limits for tasks assessed                                        General Comments      Exercises     Assessment/Plan    PT Assessment Patient needs continued PT services  PT Problem List Decreased strength;Decreased range of motion;Decreased activity tolerance;Decreased balance;Decreased mobility;Decreased knowledge of use of DME;Decreased safety awareness;Decreased knowledge of precautions;Obesity;Pain       PT Treatment Interventions DME instruction;Gait training;Stair  training;Functional mobility training;Therapeutic activities;Therapeutic exercise;Balance training;Patient/family education;Wheelchair mobility training    PT Goals (Current goals can be found in the Care Plan section)  Acute Rehab PT Goals Patient Stated Goal: Decreased pain PT Goal Formulation: With patient Time For Goal Achievement: 04/07/17 Potential to Achieve Goals: Fair    Frequency Min 2X/week   Barriers to discharge Decreased caregiver support      Co-evaluation               AM-PAC PT "6 Clicks" Daily Activity  Outcome Measure Difficulty turning over in bed (including adjusting bedclothes, sheets and blankets)?: Unable Difficulty moving from lying on back to sitting on the side of the bed? : Unable Difficulty sitting down on and standing up from a chair with arms (e.g., wheelchair, bedside commode, etc,.)?: Unable Help needed moving to and from a bed to chair (including a wheelchair)?: Total Help needed walking in hospital room?: Total Help needed climbing 3-5 steps with a railing? : Total 6 Click Score: 6    End of Session   Activity Tolerance: Patient limited by pain Patient left: in bed;Other (comment);with call bell/phone within reach (charging portable wound vac) Nurse Communication: Mobility status;Other (comment) (unwillingness to participate; pain) PT Visit Diagnosis: Other abnormalities of gait and mobility (R26.89);Muscle weakness (generalized) (M62.81);Pain Pain - Right/Left: Right Pain - part of body: Leg    Time: 0902-0920 PT Time Calculation (min) (ACUTE ONLY): 18 min   Charges:   PT Evaluation $PT Eval Moderate Complexity: 1 Mod     PT G Codes:       Ina Homes, PT, DPT Acute Rehab Services  Pager: [phone]  Malachy Chamber 03/24/2017, 9:31 AM

## 2017-03-25 LAB — GLUCOSE, CAPILLARY
GLUCOSE-CAPILLARY: 286 mg/dL — AB (ref 65–99)
GLUCOSE-CAPILLARY: 225 mg/dL — AB (ref 65–99)
Glucose-Capillary: 258 mg/dL — ABNORMAL HIGH (ref 65–99)
Glucose-Capillary: 248 mg/dL — ABNORMAL HIGH (ref 65–99)

## 2017-03-25 MED ORDER — OXYCODONE HCL 15 MG PO TABS: 15.0000 mg | ORAL_TABLET | ORAL | 0 refills | Status: DC | PRN

## 2017-03-25 MED ORDER — INSULIN GLARGINE 100 UNIT/ML ~~LOC~~ SOLN
38.0000 [IU] | Freq: Every day | SUBCUTANEOUS | Status: DC
  Administered 2017-03-25 – 2017-03-27 (×3): 38 [IU] via SUBCUTANEOUS
  Filled 2017-03-25 (×4): qty 0.38

## 2017-03-25 NOTE — Social Work (Signed)
CSW discussed bed offers with patient and he has accepted bed offer from [facility].  CSW advised admission staff of same.  Keene Breath, LCSW Clinical Social Worker [phone]

## 2017-03-25 NOTE — Clinical Social Work Note (Signed)
Clinical Social Work Assessment  Patient Details  Name: [name] MRN: [id number] Date of Birth: 15-Apr-1951  Date of referral:  03/25/17               Reason for consult:  Facility Placement                Permission sought to share information with:  Chartered certified accountant granted to share information::  Yes, Verbal Permission Granted  Name::     Medical sales representative::  SNF  Relationship::  sister  Contact Information:     Housing/Transportation Living arrangements for the past 2 months:  Single Family Home Source of Information:  Patient Patient Interpreter Needed:  None Criminal Activity/Legal Involvement Pertinent to Current Situation/Hospitalization:  No - Comment as needed Significant Relationships:  Siblings Lives with:  Siblings Do you feel safe going back to the place where you live?  No Need for family participation in patient care:  Yes (Comment)  Care giving concerns: Pt from [facility] and rehab for short term needs and returned to hospital to have Right Below the Knee amputation on 9/13. Pt was from home with sister and would like to return after short term rehab.  Social Worker assessment / plan:  CSW met with patient to discuss recommendations for SNF. Pt in agreement and has experience with SNF since he just came from facility. Pt would like to go to other SNf's. CSW obtained permission to send out offers and CSW will f/u with sister on SNF placement per patient.  FL2 complete. passr confirmed. Offers to be sent.  Employment status:  Disabled (Comment on whether or not currently receiving Disability) Insurance information:  Managed Medicare PT Recommendations:  [place] / Referral to community resources:  [place]  Patient/Family's Response to care:  Patient appreciative of CSW following up. No issues or concerns identified.  Patient/Family's Understanding of and Emotional Response to  Diagnosis, Current Treatment, and Prognosis:  Patient/family has good understanding of diagnosis, current treatment and prognosis. Pt hopeful that short term rehab  Will address new impairment and he can return home with family soon. No issues or concerns identified at this time.  Emotional Assessment Appearance:    Attitude/Demeanor/Rapport:   (Cooperative) Affect (typically observed):  Accepting, Appropriate Orientation:  Oriented to Self, Oriented to Place, Oriented to  Time, Oriented to Situation Alcohol / Substance use:  Not Applicable Psych involvement (Current and /or in the community):  No (Comment)  Discharge Needs  Concerns to be addressed:  Care Coordination Readmission within the last 30 days:  Yes Current discharge risk:  Dependent with Mobility, Physical Impairment Barriers to Discharge:  No Barriers Identified   Normajean Baxter, LCSW 03/25/2017, 10:05 AM

## 2017-03-25 NOTE — Consult Note (Signed)
   [facility] CM Inpatient Consult   03/25/2017  TAMER BAUGHMAN [birth year] [phone]  Patient assessed for re-admission in the [employer] ACO.[name]  Patient was admitted for scheduled right below the knee amputation and currently to return to skilled nursing facility for rehab.  Information regarding [facility] was given at last week.  For questions, please contact:  Charlesetta Shanks, RN BSN CCM [facility]  [phone] business mobile phone Toll free office [phone]

## 2017-03-25 NOTE — Progress Notes (Signed)
Patient ID: [name], male   DOB: [birth year], 66 y.o.   MRN: [id number] Orders and discharge summary completed for discharge to skilled nursing.  Prevena wound VAC is functioning well good suction fit.  Possible discharge to skilled nursing on Saturday most likely discharge on Monday.
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
## 2017-03-25 NOTE — Progress Notes (Signed)
Patient has a pressure ulcer on bottom.  Would not allow me to turn him to assess.

## 2017-03-25 NOTE — [place] FL2 (Signed)
[place] MEDICAID FL2 LEVEL OF CARE SCREENING TOOL     IDENTIFICATION  Patient Name: [name] Birthdate: Nov 13, 1950 Sex: male Admission Date (Current Location): 03/23/2017  [facility] and [place] Number:  Producer, television/film/video and Address:  [facility], [address] [place], [place] [address]      Provider Number: [id number]  Attending Physician Name and Address:  Nadara Mustard, MD  Relative Name and Phone Number:  Prestin Munch    Current Level of Care: Hospital Recommended Level of Care: Skilled Nursing Facility Prior Approval Number:    Date Approved/Denied: 03/25/17 PASRR Number: [id number]  Discharge Plan: SNF    Current Diagnoses: Patient Active Problem List   Diagnosis Date Noted  . Pressure injury of skin 03/24/2017  . Below knee amputation status, right (HCC) 03/23/2017  . Acute osteomyelitis of right foot (HCC) 03/16/2017  . DKA (diabetic ketoacidoses) (HCC) 03/14/2017  . Elevated troponin 03/14/2017  . Subacute osteomyelitis, right ankle and foot (HCC)   . Diabetic polyneuropathy associated with type 2 diabetes mellitus (HCC)   . Diabetic ulcer of right midfoot associated with diabetes mellitus due to underlying condition, limited to breakdown of skin (HCC)   . Osteoarthritis of both knees   . Diabetic nephropathy associated with type 2 diabetes mellitus (HCC)   . Bilateral lower leg cellulitis 01/22/2017  . Venous stasis 08/06/2016  . At high risk for falls 07/21/2016  . CHF exacerbation (HCC) 06/10/2016  . Lactic acidosis 06/09/2016  . Syncope and collapse 03/05/2016  . Acute on chronic renal insufficiency 03/05/2016  . Obesity 12/30/2015  . Scrotal swelling   . Foot fracture   . 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 Lower GI bleed   . Pressure ulcer 12/24/2015  . Acute kidney injury (HCC) 12/23/2015  . Back pain 12/23/2015  . Fall   . Metatarsal fracture   . Traumatic rhabdomyolysis (HCC)   .  Hyperkalemia   . Lisfranc dislocation   . Hyperglycemia   . Sepsis, unspecified organism (HCC) 09/08/2015  . Scrotal wall abscess 09/08/2015  . AKI (acute kidney injury) (HCC) 09/08/2015  . Chest wall pain   . On amiodarone therapy 12/08/2014  . Respiratory failure with hypoxia (HCC) 12/04/2014  . Hypoxia 12/04/2014  . Acute on chronic respiratory failure with hypoxia (HCC)   . Atrial fibrillation with RVR (HCC) 09/20/2014  . Acute on chronic diastolic CHF (congestive heart failure) (HCC) 09/19/2014  . Acute respiratory failure (HCC) 09/19/2014  . Abnormal nuclear cardiac imaging test 02/12/2014  . Dyspnea 02/12/2014  . Chronic anticoagulation- Coumadin 02/12/2014  . CKD (chronic kidney disease) stage 3, GFR 30-59 ml/min 02/12/2014  . Chronic diastolic congestive heart failure (HCC) 02/11/2014  . Encounter for therapeutic drug monitoring 08/03/2013  . Pilonidal cyst 01/25/2013  . Perirectal cellulitis 09/06/2012  . Long term (current) use of anticoagulants 11/18/2011  . Obstructive sleep apnea- unable to tolerate c-pap 08/04/2011  . CAD S/P percutaneous coronary angioplasty 08/04/2011  . Type II diabetes mellitus with renal manifestations, uncontrolled (HCC) 01/06/2009  . ANEMIA-IRON DEFICIENCY 01/06/2009  . OTHER AND UNSPECIFIED COAGULATION DEFECTS 01/06/2009  . ANEMIA 01/02/2009  . CARDIOVASCULAR FUNCTION STUDY, ABNORMAL 01/02/2009  . Essential hypertension, benign 10/22/2008  . Atrial fibrillation 10/22/2008  . Hyperlipidemia 09/23/2008  . HYPOKALEMIA 09/23/2008  . Obesity, Class III, BMI 40-49.9 (morbid obesity) (HCC) 09/23/2008  . CELLULITIS AND ABSCESS OF LEG EXCEPT FOOT 09/23/2008    Orientation RESPIRATION BLADDER Height &  Weight     Self, Time, Situation, Place  O2 (2L) Continent Weight:   Height:     BEHAVIORAL SYMPTOMS/MOOD NEUROLOGICAL BOWEL NUTRITION STATUS      Continent Diet (heart healthy/carb modified)  AMBULATORY STATUS COMMUNICATION OF NEEDS Skin    Extensive Assist (mod assist) Verbally (slow to respond) Other (Comment), Wound Vac, PU Stage and Appropriate Care (diabetic would on r foot)     PU Stage 3 Dressing:  (PRN)                 Personal Care Assistance Level of Assistance  Bathing, Feeding, Dressing Bathing Assistance: Maximum assistance Feeding assistance: Limited assistance Dressing Assistance: Maximum assistance     Functional Limitations Info  Sight, Hearing, Speech Sight Info: Adequate Hearing Info: Adequate Speech Info: Adequate    SPECIAL CARE FACTORS FREQUENCY  PT (By licensed PT), OT (By licensed OT)     PT Frequency: 2x week OT Frequency: 2x week            Contractures Contractures Info: Not present    Additional Factors Info  Code Status, Allergies, Insulin Sliding Scale Code Status Info: DNR Allergies Info: No Known Allergies   Insulin Sliding Scale Info: Insulin Daily       Current Medications (03/25/2017):  This is the current hospital active medication list Current Facility-Administered Medications  Medication Dose Route Frequency Provider Last Rate Last Dose  . 0.9 %  sodium chloride infusion   Intravenous Continuous Nadara Mustard, MD 10 mL/hr at 03/23/17 1543    . acetaminophen (TYLENOL) tablet 650 mg  650 mg Oral Q6H PRN Nadara Mustard, MD       Or  . acetaminophen (TYLENOL) suppository 650 mg  650 mg Rectal Q6H PRN Nadara Mustard, MD      . aspirin EC tablet 81 mg  81 mg Oral Daily Nadara Mustard, MD   81 mg at 03/25/17 0820  . atorvastatin (LIPITOR) tablet 80 mg  80 mg Oral q1800 Nadara Mustard, MD   80 mg at 03/24/17 1706  . bisacodyl (DULCOLAX) suppository 10 mg  10 mg Rectal Daily PRN Nadara Mustard, MD      . diltiazem (CARDIZEM CD) 24 hr capsule 240 mg  240 mg Oral Daily Nadara Mustard, MD   240 mg at 03/25/17 0820  . docusate sodium (COLACE) capsule 100 mg  100 mg Oral BID Nadara Mustard, MD   100 mg at 03/25/17 0820  . gabapentin (NEURONTIN) capsule 600 mg  600 mg Oral  BID Nadara Mustard, MD   600 mg at 03/25/17 0819  . HYDROmorphone (DILAUDID) injection 1 mg  1 mg Intravenous Q2H PRN Nadara Mustard, MD   1 mg at 03/25/17 0859  . Influenza vac split quadrivalent PF (FLUZONE HIGH-DOSE) injection 0.5 mL  0.5 mL Intramuscular Tomorrow-1000 Nadara Mustard, MD      . insulin aspart (novoLOG) injection 0-20 Units  0-20 Units Subcutaneous TID WC Nadara Mustard, MD   11 Units at 03/25/17 [phone]  . insulin aspart (novoLOG) injection 6 Units  6 Units Subcutaneous TID WC Nadara Mustard, MD   6 Units at 03/25/17 [phone]  . insulin glargine (LANTUS) injection 38 Units  38 Units Subcutaneous QHS Nadara Mustard, MD      . magnesium citrate solution 1 Bottle  1 Bottle Oral Once PRN Nadara Mustard, MD      . methocarbamol (ROBAXIN) tablet 500 mg  500 mg Oral Q6H PRN Nadara Mustard, MD   500 mg at 03/25/17 0501   Or  . methocarbamol (ROBAXIN) 500 mg in dextrose 5 % 50 mL IVPB  500 mg Intravenous Q6H PRN Nadara Mustard, MD      . metoCLOPramide (REGLAN) tablet 5-10 mg  5-10 mg Oral Q8H PRN Nadara Mustard, MD       Or  . metoCLOPramide (REGLAN) injection 5-10 mg  5-10 mg Intravenous Q8H PRN Nadara Mustard, MD      . metolazone (ZAROXOLYN) tablet 5 mg  5 mg Oral Once per day on Mon Fri Nadara Mustard, MD   5 mg at 03/25/17 2620  . metoprolol tartrate (LOPRESSOR) tablet 50 mg  50 mg Oral BID Nadara Mustard, MD   50 mg at 03/25/17 0820  . ondansetron (ZOFRAN) tablet 4 mg  4 mg Oral Q6H PRN Nadara Mustard, MD       Or  . ondansetron [facility]) injection 4 mg  4 mg Intravenous Q6H PRN Nadara Mustard, MD      . oxyCODONE (Oxy IR/ROXICODONE) immediate release tablet 15 mg  15 mg Oral Q3H PRN Nadara Mustard, MD   15 mg at 03/25/17 0700  . polyethylene glycol (MIRALAX / GLYCOLAX) packet 17 g  17 g Oral Daily Nadara Mustard, MD   17 g at 03/25/17 0818  . polyethylene glycol (MIRALAX / GLYCOLAX) packet 17 g  17 g Oral Daily PRN Nadara Mustard, MD      . potassium chloride SA (K-DUR,KLOR-CON) CR  tablet 40 mEq  40 mEq Oral BID Nadara Mustard, MD   40 mEq at 03/25/17 0820  . rivaroxaban (XARELTO) tablet 20 mg  20 mg Oral Q supper Nadara Mustard, MD   20 mg at 03/24/17 1706  . spironolactone (ALDACTONE) tablet 25 mg  25 mg Oral Daily Nadara Mustard, MD   25 mg at 03/25/17 0819  . torsemide (DEMADEX) tablet 80 mg  80 mg Oral BID Nadara Mustard, MD   80 mg at 03/25/17 3559     Discharge Medications: Please see discharge summary for a list of discharge medications.  Relevant Imaging Results:  Relevant Lab Results:   Additional Information SS#:[id number]  Tresa Moore, LCSW

## 2017-03-25 NOTE — Discharge Summary (Signed)
Discharge Diagnoses:  Active Problems:   Below knee amputation status, right (HCC)   Pressure injury of skin   Surgeries: Procedure(s): RIGHT BELOW KNEE AMPUTATION on 03/23/2017    Consultants:   Discharged Condition: Improved  Hospital Course: [name] is an 66 y.o. male who was admitted 03/23/2017 with a chief complaint of abscess osteomyelitis right foot, with a final diagnosis of Osteomyelitis/Abscess Right Foot.  Patient was brought to the operating room on 03/23/2017 and underwent Procedure(s): RIGHT BELOW KNEE AMPUTATION.    Patient was given perioperative antibiotics: Anti-infectives    Start     Dose/Rate Route Frequency Ordered Stop   03/23/17 1800  ceFAZolin (ANCEF) IVPB 2g/100 mL premix     2 g 200 mL/hr over 30 Minutes Intravenous Every 6 hours 03/23/17 1511 03/24/17 0804    .  Patient was given sequential compression devices, early ambulation, and aspirin for DVT prophylaxis.  Recent vital signs: Patient Vitals for the past 24 hrs:  BP Temp Temp src Pulse Resp SpO2  03/25/17 0300 (!) 127/59 98.5 F (36.9 C) Oral (!) 102 16 100 %  03/24/17 1900 (!) 147/54 98.8 F (37.1 C) Oral 77 16 95 %  03/24/17 1458 (!) 129/48 98.5 F (36.9 C) Oral 80 16 95 %  .  Recent laboratory studies: No results found.  Discharge Medications:   Allergies as of 03/25/2017   No Known Allergies     Medication List    STOP taking these medications   amoxicillin-clavulanate 875-125 MG tablet Commonly known as:  AUGMENTIN   doxycycline 100 MG EC tablet Commonly known as:  DORYX     TAKE these medications   acetaminophen 500 MG tablet Commonly known as:  TYLENOL Take 1,000 mg by mouth 3 (three) times daily.   aspirin 81 MG EC tablet Take 1 tablet (81 mg total) by mouth daily.   atorvastatin 80 MG tablet Commonly known as:  LIPITOR Take 1 tablet (80 mg total) by mouth daily. What changed:  when to take this   diltiazem 240 MG 24 hr capsule Commonly known as:   CARDIZEM CD Take 1 capsule (240 mg total) by mouth daily.   docusate sodium 100 MG capsule Commonly known as:  COLACE Take 1 capsule (100 mg total) by mouth daily.   gabapentin 300 MG capsule Commonly known as:  NEURONTIN Take 2 capsules (600 mg total) by mouth 2 (two) times daily.   hydrocerin Crea Apply 1 application topically 3 (three) times daily. legs   insulin aspart 100 UNIT/ML injection Commonly known as:  novoLOG Inject 10 Units into the skin See admin instructions. Inject 10 units subcutaneously after meals if BS is greater than 150 and eats greater than 50% meal   KLOR-CON M20 20 MEQ tablet Generic drug:  potassium chloride SA TAKE 2 TABLETS BY MOUTH TWICE A DAY What changed:  See the new instructions.   LANTUS SOLOSTAR 100 UNIT/ML Solostar Pen Generic drug:  Insulin Glargine Inject 50 Units into the skin daily.   metolazone 5 MG tablet Commonly known as:  ZAROXOLYN Take 5mg  twice weekly on Mondays and Fridays. What changed:  how much to take  how to take this  when to take this  additional instructions   metoprolol tartrate 50 MG tablet Commonly known as:  LOPRESSOR Take 1 tablet (50 mg total) by mouth 2 (two) times daily.   multivitamin with minerals Tabs tablet Take 1 tablet by mouth daily.   NATURAL BALANCE TEARS OP Place 1  drop into both eyes daily as needed (for dry eyes).   oxyCODONE 15 MG immediate release tablet Commonly known as:  ROXICODONE Take 1 tablet (15 mg total) by mouth every 4 (four) hours as needed for pain. Hold for Sedation What changed:  when to take this  reasons to take this   OXYGEN Inhale 2-3 L into the lungs continuous.   polyethylene glycol packet Commonly known as:  MIRALAX / GLYCOLAX Take 17 g by mouth daily.   rivaroxaban 20 MG Tabs tablet Commonly known as:  XARELTO Take 20 mg by mouth daily with supper.   spironolactone 25 MG tablet Commonly known as:  ALDACTONE Take 25 mg by mouth daily.    torsemide 20 MG tablet Commonly known as:  DEMADEX Take 4 tablets (80 mg total) by mouth 2 (two) times daily.            Discharge Care Instructions        Start     Ordered   03/25/17 0000  oxyCODONE (ROXICODONE) 15 MG immediate release tablet  Every 4 hours PRN     03/25/17 0650   03/25/17 0000  Call MD / Call 911    Comments:  If you experience chest pain or shortness of breath, CALL 911 and be transported to the hospital emergency room.  If you develope a fever above 101 F, pus (white drainage) or increased drainage or redness at the wound, or calf pain, call your surgeon's office.   03/25/17 0650   03/25/17 0000  Diet - low sodium heart healthy     03/25/17 0650   03/25/17 0000  Constipation Prevention    Comments:  Drink plenty of fluids.  Prune juice may be helpful.  You may use a stool softener, such as Colace (over the counter) 100 mg twice a day.  Use MiraLax (over the counter) for constipation as needed.   03/25/17 0650   03/25/17 0000  Increase activity slowly as tolerated     03/25/17 0650   03/25/17 0000  Negative Pressure Wound Therapy - Incisional     03/25/17 0650      Diagnostic Studies: Dg Chest 2 View  Result Date: 03/14/2017 CLINICAL DATA:  Dyspnea and weakness tonight. EXAM: CHEST  2 VIEW COMPARISON:  01/22/2017 FINDINGS: Unchanged moderate cardiomegaly. No airspace consolidation. Mild vascular and interstitial prominence. No pleural effusions. IMPRESSION: Cardiomegaly and mild vascular/ interstitial prominence. No alveolar edema. No effusions. Electronically Signed   By: Ellery Plunk M.D.   On: 03/14/2017 04:02   Mr Foot Right Wo Contrast  Result Date: 03/15/2017 CLINICAL DATA:  Draining foot ulcer. EXAM: MRI OF THE RIGHT FOREFOOT WITHOUT CONTRAST TECHNIQUE: Multiplanar, multisequence MR imaging of the right foot was performed. No intravenous contrast was administered. COMPARISON:  Radiographs 03/14/2017 FINDINGS: As demonstrated on the radiographs  there is osteomyelitis involving the proximal aspect of the fifth metatarsal. There is also signal abnormality involving the base of the fourth metatarsal and the cuboid with an intervening joint effusion. Findings consistent with osteomyelitis and septic arthritis. Complex fluid collection along the lateral and plantar aspect of the base of fifth metatarsal likely containing gas and consistent with an abscess. Other areas of gas noted in the soft tissues. There is diffuse cellulitis and myofasciitis. No findings for pyomyositis. IMPRESSION: 1. MR findings consistent with osteomyelitis involving the entire proximal shaft of the fifth metatarsal, the base of the fourth metatarsal and the adjacent cuboid. Septic arthritis at the cuboid articulation with the fourth  and fifth metatarsals is also suspected. 2. Complex soft tissue abscess and draining wound along the lateral and plantar aspect of the fifth metatarsal base. 3. Cellulitis and myofasciitis without definite findings for pyomyositis. Electronically Signed   By: Rudie Meyer M.D.   On: 03/15/2017 08:02   Dg Foot 2 Views Right  Result Date: 03/14/2017 CLINICAL DATA:  Diabetic right foot ulcers. EXAM: RIGHT FOOT - 2 VIEW COMPARISON:  12/23/2015. FINDINGS: Soft tissue ulceration with gas involving the plantar surface underlying the third, fourth and fifth metatarsals. Osteolysis at the base of the fifth metatarsal which is unchanged from the June, 2017 examination. New osteolysis suspected in the mid shaft of the fifth metatarsal, associated with periosteal new bone. Osteolysis involving the cuboid bone, new since the prior examination Generalized osseous demineralization. No visible acute fractures. Joint space narrowing involving the midfoot. 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 Electronically Signed   By: Hulan Saas M.D.   On: 03/14/2017 08:42    Patient benefited  maximally from their hospital stay and there were no complications.     Disposition: 03-Skilled Nursing Facility Discharge Instructions    Call MD / Call 911    Complete by:  As directed    If you experience chest pain or shortness of breath, CALL 911 and be transported to the hospital emergency room.  If you develope a fever above 101 F, pus (white drainage) or increased drainage or redness at the wound, or calf pain, call your surgeon's office.   Constipation Prevention    Complete by:  As directed    Drink plenty of fluids.  Prune juice may be helpful.  You may use a stool softener, such as Colace (over the counter) 100 mg twice a day.  Use MiraLax (over the counter) for constipation as needed.   Diet - low sodium heart healthy    Complete by:  As directed    Increase activity slowly as tolerated    Complete by:  As directed    Negative Pressure Wound Therapy - Incisional    Complete by:  As directed    Continue Prevena wound VAC for 1 week. When the wound VAC on the arms and stops working remove the dressing applied dry dressing was Ace wrap.     Follow-up Information    Nadara Mustard, MD Follow up in 2 week(s).   Specialty:  Orthopedic Surgery Contact information: [address] [place] [address] [phone]            Signed: Nadara Mustard 03/25/2017, 6:50 AM

## 2017-03-26 LAB — GLUCOSE, CAPILLARY
GLUCOSE-CAPILLARY: 260 mg/dL — AB (ref 65–99)
GLUCOSE-CAPILLARY: 372 mg/dL — AB (ref 65–99)
GLUCOSE-CAPILLARY: 408 mg/dL — AB (ref 65–99)
GLUCOSE-CAPILLARY: 419 mg/dL — AB (ref 65–99)
Glucose-Capillary: 343 mg/dL — ABNORMAL HIGH (ref 65–99)
Glucose-Capillary: 382 mg/dL — ABNORMAL HIGH (ref 65–99)

## 2017-03-26 NOTE — Clinical Social Work Note (Signed)
Clinical Social Worker continuing to follow patient and family for support and discharge planning needs.  CSW contacted several facilities, including [facility], who state that unfortunately due to patient need for bariatric bed, no admission until Monday - RN updated.  CSW remains available for support and to facilitate patient discharge needs once medically stable.  Macario Golds, [place] [phone]

## 2017-03-26 NOTE — Progress Notes (Signed)
Patient's blood sugar is currently 419. Dr. Ophelia Charter notified, stat glucose ordered. Will give ordered 20 units of novolog and follow up. Gildardo Cranker, RN

## 2017-03-27 LAB — GLUCOSE, CAPILLARY
GLUCOSE-CAPILLARY: 387 mg/dL — AB (ref 65–99)
GLUCOSE-CAPILLARY: 337 mg/dL — AB (ref 65–99)
GLUCOSE-CAPILLARY: 316 mg/dL — AB (ref 65–99)
Glucose-Capillary: 351 mg/dL — ABNORMAL HIGH (ref 65–99)

## 2017-03-27 NOTE — Progress Notes (Signed)
   Subjective: 4 Days Post-Op Procedure(s) (LRB): RIGHT BELOW KNEE AMPUTATION (Right) Patient reports pain as mild.    Objective: Vital signs in last 24 hours: Temp:  [97.5 F (36.4 C)-99 F (37.2 C)] 97.8 F (36.6 C) (09/16 0635) Pulse Rate:  [95-110] 110 (09/16 0635) Resp:  [18-22] 18 (09/16 0635) BP: (110-125)/(58-86) 116/61 (09/16 0635) SpO2:  [96 %-100 %] 100 % (09/16 0635)  Intake/Output from previous day: 09/15 0701 - 09/16 0700 In: 240 [P.O.:240] Out: 520 [Urine:520] Intake/Output this shift: No intake/output data recorded.  No results for input(s): HGB in the last 72 hours. No results for input(s): WBC, RBC, HCT, PLT in the last 72 hours. No results for input(s): NA, K, CL, CO2, BUN, CREATININE, GLUCOSE, CALCIUM in the last 72 hours. No results for input(s): LABPT, INR in the last 72 hours.  dressing dry.  No results found.  Assessment/Plan: 4 Days Post-Op Procedure(s) (LRB): RIGHT BELOW KNEE AMPUTATION (Right) Discharge to SNF  Eldred Manges 03/27/2017, 10:43 AM

## 2017-03-28 DIAGNOSIS — I10 Essential (primary) hypertension: Secondary | ICD-10-CM | POA: Diagnosis not present

## 2017-03-28 DIAGNOSIS — M6281 Muscle weakness (generalized): Secondary | ICD-10-CM | POA: Diagnosis not present

## 2017-03-28 DIAGNOSIS — R278 Other lack of coordination: Secondary | ICD-10-CM | POA: Diagnosis not present

## 2017-03-28 DIAGNOSIS — M79604 Pain in right leg: Secondary | ICD-10-CM | POA: Diagnosis not present

## 2017-03-28 DIAGNOSIS — S88019A Complete traumatic amputation at knee level, unspecified lower leg, initial encounter: Secondary | ICD-10-CM | POA: Diagnosis not present

## 2017-03-28 DIAGNOSIS — R6520 Severe sepsis without septic shock: Secondary | ICD-10-CM | POA: Diagnosis not present

## 2017-03-28 DIAGNOSIS — Z7409 Other reduced mobility: Secondary | ICD-10-CM | POA: Diagnosis not present

## 2017-03-28 DIAGNOSIS — S88011S Complete traumatic amputation at knee level, right lower leg, sequela: Secondary | ICD-10-CM | POA: Diagnosis not present

## 2017-03-28 DIAGNOSIS — E0821 Diabetes mellitus due to underlying condition with diabetic nephropathy: Secondary | ICD-10-CM | POA: Diagnosis not present

## 2017-03-28 DIAGNOSIS — Z89512 Acquired absence of left leg below knee: Secondary | ICD-10-CM | POA: Diagnosis not present

## 2017-03-28 LAB — GLUCOSE, CAPILLARY
GLUCOSE-CAPILLARY: 365 mg/dL — AB (ref 65–99)
GLUCOSE-CAPILLARY: 407 mg/dL — AB (ref 65–99)

## 2017-03-28 LAB — GLUCOSE, RANDOM: Glucose, Bld: 286 mg/dL — ABNORMAL HIGH (ref 65–99)

## 2017-03-28 NOTE — Discharge Summary (Signed)
Discharge Diagnoses:  Active Problems:   Below knee amputation status, right (HCC)   Pressure injury of skin   Surgeries: Procedure(s): RIGHT BELOW KNEE AMPUTATION on 03/23/2017    Consultants:   Discharged Condition: Improved  Hospital Course: [name] is an 66 y.o. male who was admitted 03/23/2017 with a chief complaint of abscess right foot, with a final diagnosis of Osteomyelitis/Abscess Right Foot.  Patient was brought to the operating room on 03/23/2017 and underwent Procedure(s): RIGHT BELOW KNEE AMPUTATION.    Patient was given perioperative antibiotics: Anti-infectives    Start     Dose/Rate Route Frequency Ordered Stop   03/23/17 1800  ceFAZolin (ANCEF) IVPB 2g/100 mL premix     2 g 200 mL/hr over 30 Minutes Intravenous Every 6 hours 03/23/17 1511 03/24/17 0804    .  Patient was given sequential compression devices, early ambulation, and aspirin for DVT prophylaxis.  Recent vital signs: Patient Vitals for the past 24 hrs:  BP Temp Temp src Pulse Resp SpO2  03/27/17 2145 (!) 108/53 98.4 F (36.9 C) - 79 18 98 %  03/27/17 2144 (!) 108/53 - - 79 - -  03/27/17 1659 121/60 98.3 F (36.8 C) Oral 79 - 98 %  03/27/17 0635 116/61 97.8 F (36.6 C) Oral (!) 110 18 100 %  .  Recent laboratory studies: No results found.  Discharge Medications:   Allergies as of 03/28/2017   No Known Allergies     Medication List    STOP taking these medications   amoxicillin-clavulanate 875-125 MG tablet Commonly known as:  AUGMENTIN   doxycycline 100 MG EC tablet Commonly known as:  DORYX     TAKE these medications   acetaminophen 500 MG tablet Commonly known as:  TYLENOL Take 1,000 mg by mouth 3 (three) times daily.   aspirin 81 MG EC tablet Take 1 tablet (81 mg total) by mouth daily.   atorvastatin 80 MG tablet Commonly known as:  LIPITOR Take 1 tablet (80 mg total) by mouth daily. What changed:  when to take this   diltiazem 240 MG 24 hr capsule Commonly known  as:  CARDIZEM CD Take 1 capsule (240 mg total) by mouth daily.   docusate sodium 100 MG capsule Commonly known as:  COLACE Take 1 capsule (100 mg total) by mouth daily.   gabapentin 300 MG capsule Commonly known as:  NEURONTIN Take 2 capsules (600 mg total) by mouth 2 (two) times daily.   hydrocerin Crea Apply 1 application topically 3 (three) times daily. legs   insulin aspart 100 UNIT/ML injection Commonly known as:  novoLOG Inject 10 Units into the skin See admin instructions. Inject 10 units subcutaneously after meals if BS is greater than 150 and eats greater than 50% meal   KLOR-CON M20 20 MEQ tablet Generic drug:  potassium chloride SA TAKE 2 TABLETS BY MOUTH TWICE A DAY What changed:  See the new instructions.   LANTUS SOLOSTAR 100 UNIT/ML Solostar Pen Generic drug:  Insulin Glargine Inject 50 Units into the skin daily.   metolazone 5 MG tablet Commonly known as:  ZAROXOLYN Take 5mg  twice weekly on Mondays and Fridays. What changed:  how much to take  how to take this  when to take this  additional instructions   metoprolol tartrate 50 MG tablet Commonly known as:  LOPRESSOR Take 1 tablet (50 mg total) by mouth 2 (two) times daily.   multivitamin with minerals Tabs tablet Take 1 tablet by mouth daily.  NATURAL BALANCE TEARS OP Place 1 drop into both eyes daily as needed (for dry eyes).   oxyCODONE 15 MG immediate release tablet Commonly known as:  ROXICODONE Take 1 tablet (15 mg total) by mouth every 4 (four) hours as needed for pain. Hold for Sedation What changed:  when to take this  reasons to take this   OXYGEN Inhale 2-3 L into the lungs continuous.   polyethylene glycol packet Commonly known as:  MIRALAX / GLYCOLAX Take 17 g by mouth daily.   rivaroxaban 20 MG Tabs tablet Commonly known as:  XARELTO Take 20 mg by mouth daily with supper.   spironolactone 25 MG tablet Commonly known as:  ALDACTONE Take 25 mg by mouth daily.    torsemide 20 MG tablet Commonly known as:  DEMADEX Take 4 tablets (80 mg total) by mouth 2 (two) times daily.            Discharge Care Instructions        Start     Ordered   03/28/17 0000  Call MD / Call 911    Comments:  If you experience chest pain or shortness of breath, CALL 911 and be transported to the hospital emergency room.  If you develope a fever above 101 F, pus (white drainage) or increased drainage or redness at the wound, or calf pain, call your surgeon's office.   03/28/17 0623   03/28/17 0000  Diet - low sodium heart healthy     03/28/17 0623   03/28/17 0000  Constipation Prevention    Comments:  Drink plenty of fluids.  Prune juice may be helpful.  You may use a stool softener, such as Colace (over the counter) 100 mg twice a day.  Use MiraLax (over the counter) for constipation as needed.   03/28/17 0623   03/28/17 0000  Increase activity slowly as tolerated     03/28/17 0623   03/25/17 0000  oxyCODONE (ROXICODONE) 15 MG immediate release tablet  Every 4 hours PRN     03/25/17 0650   03/25/17 0000  Call MD / Call 911    Comments:  If you experience chest pain or shortness of breath, CALL 911 and be transported to the hospital emergency room.  If you develope a fever above 101 F, pus (white drainage) or increased drainage or redness at the wound, or calf pain, call your surgeon's office.   03/25/17 0650   03/25/17 0000  Diet - low sodium heart healthy     03/25/17 0650   03/25/17 0000  Constipation Prevention    Comments:  Drink plenty of fluids.  Prune juice may be helpful.  You may use a stool softener, such as Colace (over the counter) 100 mg twice a day.  Use MiraLax (over the counter) for constipation as needed.   03/25/17 0650   03/25/17 0000  Increase activity slowly as tolerated     03/25/17 0650   03/25/17 0000  Negative Pressure Wound Therapy - Incisional     03/25/17 0650      Diagnostic Studies: Dg Chest 2 View  Result Date:  03/14/2017 CLINICAL DATA:  Dyspnea and weakness tonight. EXAM: CHEST  2 VIEW COMPARISON:  01/22/2017 FINDINGS: Unchanged moderate cardiomegaly. No airspace consolidation. Mild vascular and interstitial prominence. No pleural effusions. IMPRESSION: Cardiomegaly and mild vascular/ interstitial prominence. No alveolar edema. No effusions. Electronically Signed   By: Ellery Plunk M.D.   On: 03/14/2017 04:02   Mr Foot Right Wo Contrast  Result Date: 03/15/2017 CLINICAL DATA:  Draining foot ulcer. EXAM: MRI OF THE RIGHT FOREFOOT WITHOUT CONTRAST TECHNIQUE: Multiplanar, multisequence MR imaging of the right foot was performed. No intravenous contrast was administered. COMPARISON:  Radiographs 03/14/2017 FINDINGS: As demonstrated on the radiographs there is osteomyelitis involving the proximal aspect of the fifth metatarsal. There is also signal abnormality involving the base of the fourth metatarsal and the cuboid with an intervening joint effusion. Findings consistent with osteomyelitis and septic arthritis. Complex fluid collection along the lateral and plantar aspect of the base of fifth metatarsal likely containing gas and consistent with an abscess. Other areas of gas noted in the soft tissues. There is diffuse cellulitis and myofasciitis. No findings for pyomyositis. IMPRESSION: 1. MR findings consistent with osteomyelitis involving the entire proximal shaft of the fifth metatarsal, the base of the fourth metatarsal and the adjacent cuboid. Septic arthritis at the cuboid articulation with the fourth and fifth metatarsals is also suspected. 2. Complex soft tissue abscess and draining wound along the lateral and plantar aspect of the fifth metatarsal base. 3. Cellulitis and myofasciitis without definite findings for pyomyositis. Electronically Signed   By: Rudie Meyer M.D.   On: 03/15/2017 08:02   Dg Foot 2 Views Right  Result Date: 03/14/2017 CLINICAL DATA:  Diabetic right foot ulcers. EXAM: RIGHT FOOT - 2  VIEW COMPARISON:  12/23/2015. FINDINGS: Soft tissue ulceration with gas involving the plantar surface underlying the third, fourth and fifth metatarsals. Osteolysis at the base of the fifth metatarsal which is unchanged from the June, 2017 examination. New osteolysis suspected in the mid shaft of the fifth metatarsal, associated with periosteal new bone. Osteolysis involving the cuboid bone, new since the prior examination Generalized osseous demineralization. No visible acute fractures. Joint space narrowing involving the midfoot. 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 Electronically Signed   By: Hulan Saas M.D.   On: 03/14/2017 08:42    Patient benefited maximally from their hospital stay and there were no complications.     Disposition: 03-Skilled Nursing Facility Discharge Instructions    Call MD / Call 911    Complete by:  As directed    If you experience chest pain or shortness of breath, CALL 911 and be transported to the hospital emergency room.  If you develope a fever above 101 F, pus (white drainage) or increased drainage or redness at the wound, or calf pain, call your surgeon's office.   Call MD / Call 911    Complete by:  As directed    If you experience chest pain or shortness of breath, CALL 911 and be transported to the hospital emergency room.  If you develope a fever above 101 F, pus (white drainage) or increased drainage or redness at the wound, or calf pain, call your surgeon's office.   Constipation Prevention    Complete by:  As directed    Drink plenty of fluids.  Prune juice may be helpful.  You may use a stool softener, such as Colace (over the counter) 100 mg twice a day.  Use MiraLax (over the counter) for constipation as needed.   Constipation Prevention    Complete by:  As directed    Drink plenty of fluids.  Prune juice may be helpful.  You may use a stool softener, such as Colace  (over the counter) 100 mg twice a day.  Use MiraLax (over the counter) for constipation as needed.   Diet -  low sodium heart healthy    Complete by:  As directed    Diet - low sodium heart healthy    Complete by:  As directed    Increase activity slowly as tolerated    Complete by:  As directed    Increase activity slowly as tolerated    Complete by:  As directed    Negative Pressure Wound Therapy - Incisional    Complete by:  As directed    Continue Prevena wound VAC for 1 week. When the wound VAC on the arms and stops working remove the dressing applied dry dressing was Ace wrap.      Contact information for follow-up providers    Nadara Mustard, MD Follow up in 2 week(s).   Specialty:  Orthopedic Surgery Contact information: [address] [place] [address] [phone]            Contact information for after-discharge care    Destination    [facility] .   Specialty:  Skilled Nursing Facility Contact information: [address] [place] [address] [phone]                   Signed: Nadara Mustard 03/28/2017, 6:24 AM

## 2017-03-28 NOTE — Progress Notes (Signed)
Inpatient Diabetes Program Recommendations  AACE/ADA: New Consensus Statement on Inpatient Glycemic Control (2015)  Target Ranges:  Prepandial:   less than 140 mg/dL      Peak postprandial:   less than 180 mg/dL (1-2 hours)      Critically ill patients:  140 - 180 mg/dL   Lab Results  Component Value Date   GLUCAP 365 (H) 03/28/2017   HGBA1C 8.5 (H) 03/23/2017    Review of Glycemic ControlResults for OLSON, LUCARELLI (MRN [id number]) as of 03/28/2017 10:33  Ref. Range 03/27/2017 06:29 03/27/2017 13:59 03/27/2017 16:55 03/27/2017 21:46 03/28/2017 06:24  Glucose-Capillary Latest Ref Range: 65 - 99 mg/dL 245 (H) 809 (H) 983 (H) 316 (H) 365 (H)   Diabetes history: Type 2 diabetes Outpatient Diabetes medications: Lantus 50 units daily, Novolog 10 units tid with meals Current orders for Inpatient glycemic control:  Lantus 38 units q HS, Novolog 6 units tid with meals, Novolog resistant tid with meals  Inpatient Diabetes Program Recommendations:   Please increase Lantus to 50 units q HS and increase Novolog meal coverage to 10 units tid with meals.   Thanks, Beryl Meager, RN, BC-ADM Inpatient Diabetes Coordinator Pager [phone] (8a-5p)

## 2017-03-28 NOTE — Progress Notes (Signed)
Patient ID: [name], male   DOB: [birth year], 66 y.o.   MRN: [id number] Patient without complaints this morning. Orders written for discharge to skilled nursing. Patient will need the bariatric bed. Patient will need sacral decubitus dressing changes and the wound VAC will be removed once it alarms and stops working. The importance of knee extension was discussed.

## 2017-03-28 NOTE — Social Work (Signed)
Clinical Social Worker facilitated patient discharge including contacting patient family and facility to confirm patient discharge plans.  Clinical information faxed to facility and family agreeable with plan.    CSW arranged ambulance transport via [facility] to [facility].    RN to call [phone] to give report prior to discharge. Pt going to [facility].  Clinical Social Worker will sign off for now as social work intervention is no longer needed. Please consult us again if new need arises.  Janique Hoefer, LCSW Clinical Social Worker [phone]    

## 2017-03-28 NOTE — Clinical Social Work Placement (Signed)
   CLINICAL SOCIAL WORK PLACEMENT  NOTE  Date:  03/28/2017  Patient Details  Name: [name] MRN: [id number] Date of Birth: 11-Mar-1951  Clinical Social Work is seeking post-discharge placement for this patient at the Skilled  Nursing Facility level of care (*CSW will initial, date and re-position this form in  chart as items are completed):  Yes   Patient/family provided with [facility] Clinical Social Work Department's list of facilities offering this level of care within the geographic area requested by the patient (or if unable, by the patient's family).  Yes   Patient/family informed of their freedom to choose among providers that offer the needed level of care, that participate in Medicare, Medicaid or managed care program needed by the patient, have an available bed and are willing to accept the patient.  Yes   Patient/family informed of [facility]'s ownership interest in [facility] and [facility], as well as of the fact that they are under no obligation to receive care at these facilities.  PASRR submitted to EDS on       PASRR number received on       Existing PASRR number confirmed on 03/23/17     FL2 transmitted to all facilities in geographic area requested by pt/family on       FL2 transmitted to all facilities within larger geographic area on 03/23/17     Patient informed that his/her managed care company has contracts with or will negotiate with certain facilities, including the following:            Patient/family informed of bed offers received.  Patient chooses bed at [facility]     Physician recommends and patient chooses bed at      Patient to be transferred to [facility] on  .  Patient to be transferred to facility by [facility]     Patient family notified on 03/28/17 of transfer.  Name of family member notified:  Cindy sister     PHYSICIAN       Additional Comment:    _______________________________________________ Tresa Moore, LCSW 03/28/2017, 4:25 PM

## 2017-03-28 NOTE — Social Work (Signed)
CSW called admissions at [facility] to confirm that they will be able to accept patient for short term rehab. CSW spoke with Velna Hatchet in admissions and she is following up. She confirmed that she was unable to return call to CSW on Friday.  Velna Hatchet indicated that she was following up on bed and will get back to CSW to confirm or not.  CSW will continue to follow.  Keene Breath, LCSW Clinical Social Worker [phone]

## 2017-03-29 LAB — GLUCOSE, CAPILLARY: GLUCOSE-CAPILLARY: 220 mg/dL — AB (ref 65–99)

## 2017-03-30 DIAGNOSIS — G894 Chronic pain syndrome: Secondary | ICD-10-CM | POA: Diagnosis not present

## 2017-03-30 DIAGNOSIS — M6281 Muscle weakness (generalized): Secondary | ICD-10-CM | POA: Diagnosis not present

## 2017-03-30 DIAGNOSIS — E1121 Type 2 diabetes mellitus with diabetic nephropathy: Secondary | ICD-10-CM | POA: Diagnosis not present

## 2017-03-30 DIAGNOSIS — Z6841 Body Mass Index (BMI) 40.0 and over, adult: Secondary | ICD-10-CM | POA: Diagnosis not present

## 2017-03-30 DIAGNOSIS — I482 Chronic atrial fibrillation: Secondary | ICD-10-CM | POA: Diagnosis not present

## 2017-03-30 DIAGNOSIS — D62 Acute posthemorrhagic anemia: Secondary | ICD-10-CM | POA: Diagnosis not present

## 2017-03-30 DIAGNOSIS — I4891 Unspecified atrial fibrillation: Secondary | ICD-10-CM | POA: Diagnosis not present

## 2017-03-30 DIAGNOSIS — N492 Inflammatory disorders of scrotum: Secondary | ICD-10-CM | POA: Diagnosis not present

## 2017-03-30 DIAGNOSIS — E785 Hyperlipidemia, unspecified: Secondary | ICD-10-CM | POA: Diagnosis not present

## 2017-03-30 DIAGNOSIS — N183 Chronic kidney disease, stage 3 (moderate): Secondary | ICD-10-CM | POA: Diagnosis not present

## 2017-03-30 DIAGNOSIS — I2510 Atherosclerotic heart disease of native coronary artery without angina pectoris: Secondary | ICD-10-CM | POA: Diagnosis not present

## 2017-03-30 DIAGNOSIS — E871 Hypo-osmolality and hyponatremia: Secondary | ICD-10-CM | POA: Diagnosis not present

## 2017-03-30 DIAGNOSIS — M549 Dorsalgia, unspecified: Secondary | ICD-10-CM | POA: Diagnosis not present

## 2017-03-30 DIAGNOSIS — E861 Hypovolemia: Secondary | ICD-10-CM | POA: Diagnosis not present

## 2017-03-30 DIAGNOSIS — G4733 Obstructive sleep apnea (adult) (pediatric): Secondary | ICD-10-CM | POA: Diagnosis not present

## 2017-03-30 DIAGNOSIS — Z9181 History of falling: Secondary | ICD-10-CM | POA: Diagnosis not present

## 2017-03-30 DIAGNOSIS — R2689 Other abnormalities of gait and mobility: Secondary | ICD-10-CM | POA: Diagnosis not present

## 2017-03-30 DIAGNOSIS — I5032 Chronic diastolic (congestive) heart failure: Secondary | ICD-10-CM | POA: Diagnosis not present

## 2017-03-30 DIAGNOSIS — S31000A Unspecified open wound of lower back and pelvis without penetration into retroperitoneum, initial encounter: Secondary | ICD-10-CM | POA: Diagnosis not present

## 2017-03-30 DIAGNOSIS — E1165 Type 2 diabetes mellitus with hyperglycemia: Secondary | ICD-10-CM | POA: Diagnosis not present

## 2017-03-30 DIAGNOSIS — Z66 Do not resuscitate: Secondary | ICD-10-CM | POA: Diagnosis not present

## 2017-03-30 DIAGNOSIS — E1122 Type 2 diabetes mellitus with diabetic chronic kidney disease: Secondary | ICD-10-CM | POA: Diagnosis not present

## 2017-03-30 DIAGNOSIS — S88019A Complete traumatic amputation at knee level, unspecified lower leg, initial encounter: Secondary | ICD-10-CM | POA: Diagnosis not present

## 2017-03-30 DIAGNOSIS — N179 Acute kidney failure, unspecified: Secondary | ICD-10-CM | POA: Diagnosis not present

## 2017-03-30 DIAGNOSIS — E11319 Type 2 diabetes mellitus with unspecified diabetic retinopathy without macular edema: Secondary | ICD-10-CM | POA: Diagnosis not present

## 2017-03-30 DIAGNOSIS — R2681 Unsteadiness on feet: Secondary | ICD-10-CM | POA: Diagnosis not present

## 2017-03-30 DIAGNOSIS — E1142 Type 2 diabetes mellitus with diabetic polyneuropathy: Secondary | ICD-10-CM | POA: Diagnosis not present

## 2017-03-30 DIAGNOSIS — L89150 Pressure ulcer of sacral region, unstageable: Secondary | ICD-10-CM | POA: Diagnosis not present

## 2017-03-30 DIAGNOSIS — J9611 Chronic respiratory failure with hypoxia: Secondary | ICD-10-CM | POA: Diagnosis not present

## 2017-03-30 DIAGNOSIS — L0390 Cellulitis, unspecified: Secondary | ICD-10-CM | POA: Diagnosis not present

## 2017-03-30 DIAGNOSIS — K922 Gastrointestinal hemorrhage, unspecified: Secondary | ICD-10-CM | POA: Diagnosis not present

## 2017-03-30 DIAGNOSIS — I5030 Unspecified diastolic (congestive) heart failure: Secondary | ICD-10-CM | POA: Diagnosis not present

## 2017-03-30 DIAGNOSIS — L89109 Pressure ulcer of unspecified part of back, unspecified stage: Secondary | ICD-10-CM | POA: Diagnosis not present

## 2017-03-30 DIAGNOSIS — T45515A Adverse effect of anticoagulants, initial encounter: Secondary | ICD-10-CM | POA: Diagnosis not present

## 2017-03-30 DIAGNOSIS — N189 Chronic kidney disease, unspecified: Secondary | ICD-10-CM | POA: Diagnosis not present

## 2017-03-30 DIAGNOSIS — I130 Hypertensive heart and chronic kidney disease with heart failure and stage 1 through stage 4 chronic kidney disease, or unspecified chronic kidney disease: Secondary | ICD-10-CM | POA: Diagnosis not present

## 2017-03-30 DIAGNOSIS — I480 Paroxysmal atrial fibrillation: Secondary | ICD-10-CM | POA: Diagnosis not present

## 2017-03-30 DIAGNOSIS — J9691 Respiratory failure, unspecified with hypoxia: Secondary | ICD-10-CM | POA: Diagnosis not present

## 2017-03-30 DIAGNOSIS — R58 Hemorrhage, not elsewhere classified: Secondary | ICD-10-CM | POA: Diagnosis not present

## 2017-03-30 DIAGNOSIS — I10 Essential (primary) hypertension: Secondary | ICD-10-CM | POA: Diagnosis not present

## 2017-03-30 DIAGNOSIS — Z89511 Acquired absence of right leg below knee: Secondary | ICD-10-CM | POA: Diagnosis not present

## 2017-03-30 DIAGNOSIS — M545 Low back pain: Secondary | ICD-10-CM | POA: Diagnosis not present

## 2017-03-30 DIAGNOSIS — G8929 Other chronic pain: Secondary | ICD-10-CM | POA: Diagnosis not present

## 2017-03-30 DIAGNOSIS — D509 Iron deficiency anemia, unspecified: Secondary | ICD-10-CM | POA: Diagnosis not present

## 2017-03-30 DIAGNOSIS — L97119 Non-pressure chronic ulcer of right thigh with unspecified severity: Secondary | ICD-10-CM | POA: Diagnosis not present

## 2017-03-30 DIAGNOSIS — M6282 Rhabdomyolysis: Secondary | ICD-10-CM | POA: Diagnosis not present

## 2017-03-30 DIAGNOSIS — R739 Hyperglycemia, unspecified: Secondary | ICD-10-CM | POA: Diagnosis not present

## 2017-03-31 DIAGNOSIS — L97119 Non-pressure chronic ulcer of right thigh with unspecified severity: Secondary | ICD-10-CM | POA: Diagnosis not present

## 2017-04-01 DIAGNOSIS — E1165 Type 2 diabetes mellitus with hyperglycemia: Secondary | ICD-10-CM | POA: Diagnosis not present

## 2017-04-01 DIAGNOSIS — Z89511 Acquired absence of right leg below knee: Secondary | ICD-10-CM | POA: Diagnosis not present

## 2017-04-01 DIAGNOSIS — J9611 Chronic respiratory failure with hypoxia: Secondary | ICD-10-CM | POA: Diagnosis not present

## 2017-04-02 ENCOUNTER — Other Ambulatory Visit: Payer: Self-pay | Admitting: Internal Medicine

## 2017-04-03 ENCOUNTER — Other Ambulatory Visit: Payer: Self-pay | Admitting: Endocrinology

## 2017-04-05 ENCOUNTER — Telehealth (INDEPENDENT_AMBULATORY_CARE_PROVIDER_SITE_OTHER): Payer: Self-pay

## 2017-04-05 NOTE — Telephone Encounter (Signed)
Venita with [facility] called about patients wound vac. She stated that it stopped suctioning today. Patient has f/u appt tomorrow with Erin/Dr Lajoyce Corners.  I advised her to d/c vac and place dry dressing on patient until f/u appt tomorrow. I also advised Dr Lajoyce Corners of this.

## 2017-04-06 ENCOUNTER — Encounter (INDEPENDENT_AMBULATORY_CARE_PROVIDER_SITE_OTHER): Payer: Self-pay | Admitting: Family

## 2017-04-06 ENCOUNTER — Ambulatory Visit (INDEPENDENT_AMBULATORY_CARE_PROVIDER_SITE_OTHER): Payer: Medicare Other | Admitting: Family

## 2017-04-06 DIAGNOSIS — IMO0002 Reserved for concepts with insufficient information to code with codable children: Secondary | ICD-10-CM

## 2017-04-06 DIAGNOSIS — Z89511 Acquired absence of right leg below knee: Secondary | ICD-10-CM

## 2017-04-06 NOTE — Progress Notes (Signed)
Post-Op Visit Note   Patient: [name]           Date of Birth: [birth year]           MRN: [id number] Visit Date: 04/06/2017 PCP: [name], MD  Chief Complaint:  Chief Complaint  Patient presents with  . Right Leg - Follow-up    HPI:  HPI The patient is a 66 year old gentleman who presents today status post below the knee amputation on the right. Is residing at [facility] care for rehabilitation. Ortho Exam Incision is healing well there is no erythema no purulence no sign of infection. Does have some bleeding laterally where incision is not fully healed.   Visit Diagnoses:  1. Below knee amputation status, right (HCC)     Plan: The patient will follow-up in office in 3 weeks. Provided order for his prosthesis to [employer]. The facility will continue with daily Dial soap cleansing. Dry dressings. Where shrinker daily once obtained.  Follow-Up Instructions: Return in about 4 weeks (around 05/04/2017).   Imaging: No results found.  Orders:  No orders of the defined types were placed in this encounter.  No orders of the defined types were placed in this encounter.    PMFS History: Patient Active Problem List   Diagnosis Date Noted  . Pressure injury of skin 03/24/2017  . Below knee amputation status, right (HCC) 03/23/2017  . DKA (diabetic ketoacidoses) (HCC) 03/14/2017  . Elevated troponin 03/14/2017  . Diabetic polyneuropathy associated with type 2 diabetes mellitus (HCC)   . Osteoarthritis of both knees   . Diabetic nephropathy associated with type 2 diabetes mellitus (HCC)   . Bilateral lower leg cellulitis 01/22/2017  . Venous stasis 08/06/2016  . At high risk for falls 07/21/2016  . CHF exacerbation (HCC) 06/10/2016  . Lactic acidosis 06/09/2016  . Syncope and collapse 03/05/2016  . Acute on chronic renal insufficiency 03/05/2016  . Obesity 12/30/2015  . Scrotal swelling   . Foot fracture   . 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 Lower GI bleed   . Pressure ulcer 12/24/2015  . Acute kidney injury (HCC) 12/23/2015  . Back pain 12/23/2015  . Fall   . Metatarsal fracture   . Traumatic rhabdomyolysis (HCC)   . Hyperkalemia   . Lisfranc dislocation   . Hyperglycemia   . Sepsis, unspecified organism (HCC) 09/08/2015  . Scrotal wall abscess 09/08/2015  . AKI (acute kidney injury) (HCC) 09/08/2015  . Chest wall pain   . On amiodarone therapy 12/08/2014  . Respiratory failure with hypoxia (HCC) 12/04/2014  . Hypoxia 12/04/2014  . Acute on chronic respiratory failure with hypoxia (HCC)   . Atrial fibrillation with RVR (HCC) 09/20/2014  . Acute on chronic diastolic CHF (congestive heart failure) (HCC) 09/19/2014  . Acute respiratory failure (HCC) 09/19/2014  . Abnormal nuclear cardiac imaging test 02/12/2014  . Dyspnea 02/12/2014  . Chronic anticoagulation- Coumadin 02/12/2014  . CKD (chronic kidney disease) stage 3, GFR 30-59 ml/min 02/12/2014  . Chronic diastolic congestive heart failure (HCC) 02/11/2014  . Encounter for therapeutic drug monitoring 08/03/2013  . Pilonidal cyst 01/25/2013  . Perirectal cellulitis 09/06/2012  . Long term (current) use of anticoagulants 11/18/2011  . Obstructive sleep apnea- unable to tolerate c-pap 08/04/2011  . CAD S/P percutaneous coronary angioplasty 08/04/2011  . ANEMIA-IRON DEFICIENCY 01/06/2009  . OTHER AND UNSPECIFIED COAGULATION DEFECTS 01/06/2009  . ANEMIA 01/02/2009  . CARDIOVASCULAR FUNCTION STUDY, ABNORMAL 01/02/2009  . Essential hypertension,  benign 10/22/2008  . Atrial fibrillation 10/22/2008  . Hyperlipidemia 09/23/2008  . HYPOKALEMIA 09/23/2008  . Obesity, Class III, BMI 40-49.9 (morbid obesity) (HCC) 09/23/2008  . CELLULITIS AND ABSCESS OF LEG EXCEPT FOOT 09/23/2008   Past Medical History:  Diagnosis Date  . Arthritis    "hands and lower back" (09/19/2014)  . CAD (coronary artery disease)    a. s/p PCI to RCA in 2012. b. prior cath  in 01/2014 with elevated L/RH pressures, mild-mod CAD of LAD/LCx with patent RCA, normal EF, c/b CIN/CHF.  [name] Carpal tunnel syndrome, bilateral   . Cellulitis   . Chronic diastolic CHF (congestive heart failure) (HCC)   . Chronic kidney disease (CKD), stage III (moderate)   . Chronic lower back pain   . Diabetes (HCC)   . DKA (diabetic ketoacidoses) (HCC) 03/2017  . History of blood transfusion ~ 1954   "related to OR"  . Hyperlipidemia   . Hypertension   . Hypoxia    a. Qualified for home O2 at [facility] in 09/2014.  [name] Lower GI bleed   . Microcytic anemia   . Morbid obesity (HCC)   . Neuropathy   . OSA (obstructive sleep apnea)    "I wear nasal prongs; haven't been using prongs recently" (09/19/2014)  . PAF (paroxysmal atrial fibrillation) (HCC)    TEE DCCV 09/23/2014  . Physical deconditioning   . Pilonidal cyst 1980's; 01/25/2013  . Scrotal abscess   . Type II diabetes mellitus (HCC)     Family History  Problem Relation Age of Onset  . Adopted: Yes  . Other Other        PT ADOPTED    Past Surgical History:  Procedure Laterality Date  . ABDOMINAL SURGERY  ~ 1954   BENIGN TUMOR REMOVED  . AMPUTATION Right 03/23/2017   Procedure: RIGHT BELOW KNEE AMPUTATION;  Surgeon: Nadara Mustard, MD;  Location: [facility] OR;  Service: Orthopedics;  Laterality: Right;  . APPENDECTOMY    . BELOW KNEE LEG AMPUTATION Right 03/23/2017  . CARDIOVERSION  2010   [name] 09/19/2014  . CARDIOVERSION N/A 09/23/2014   Procedure: CARDIOVERSION;  Surgeon: Lewayne Bunting, MD;  Location: [facility] ENDOSCOPY;  Service: Cardiovascular;  Laterality: N/A;  . CARDIOVERSION N/A 12/11/2014   Procedure: CARDIOVERSION;  Surgeon: Thurmon Fair, MD;  Location: [facility] ENDOSCOPY;  Service: Cardiovascular;  Laterality: N/A;  . CATARACT EXTRACTION W/PHACO Right 11/15/2012   Procedure: CATARACT EXTRACTION PHACO AND INTRAOCULAR LENS PLACEMENT (IOC);  Surgeon: Shade Flood, MD;  Location: [facility] OR;  Service: Ophthalmology;  Laterality: Right;  .  CATARACT EXTRACTION W/PHACO Left 11/29/2012   Procedure: CATARACT EXTRACTION PHACO AND INTRAOCULAR LENS PLACEMENT (IOC);  Surgeon: Shade Flood, MD;  Location: [facility] OR;  Service: Ophthalmology;  Laterality: Left;  . CORONARY ANGIOPLASTY WITH STENT PLACEMENT  August 2012   RCA DES - [facility]  . DEBRIDEMENT  FOOT Left    debriding diabetic foot ulcers  . EYE SURGERY    . FOREIGN BODY REMOVAL Right 2014   heel,  splinter removal   . LEFT AND RIGHT HEART CATHETERIZATION WITH CORONARY ANGIOGRAM N/A 01/31/2014   Procedure: LEFT AND RIGHT HEART CATHETERIZATION WITH CORONARY ANGIOGRAM;  Surgeon: Micheline Chapman, MD;  Location: [facility] CATH LAB;  Service: Cardiovascular;  Laterality: N/A;  . PARS PLANA VITRECTOMY Left 06/05/2013   Procedure: PARS PLANA VITRECTOMY WITH 23 GAUGE with Endolaser(constellation);  Surgeon: Shade Flood, MD;  Location: [facility] OR;  Service: Ophthalmology;  Laterality: Left;  . PILONIDAL  CYST EXCISION N/A 01/08/2013   Procedure: CYST EXCISION PILONIDAL EXTENSIVE;  Surgeon: Axel Filler, MD;  Location: [facility] OR;  Service: General;  Laterality: N/A;  . PILONIDAL CYST EXCISION  1980's   "in [place]"  . TEE WITHOUT CARDIOVERSION N/A 09/23/2014   Procedure: TRANSESOPHAGEAL ECHOCARDIOGRAM (TEE);  Surgeon: Lewayne Bunting, MD;  Location: [facility] ENDOSCOPY;  Service: Cardiovascular;  Laterality: N/A;  . TONSILLECTOMY     Social History   Occupational History  . disabled truck driver    Social History Main Topics  . Smoking status: Former Smoker    Packs/day: 1.00    Years: 20.00    Types: Cigarettes    Quit date: 07/13/1983  . Smokeless tobacco: Never Used  . Alcohol use No  . Drug use: No  . Sexual activity: Not Currently

## 2017-04-07 ENCOUNTER — Telehealth (INDEPENDENT_AMBULATORY_CARE_PROVIDER_SITE_OTHER): Payer: Self-pay | Admitting: Family

## 2017-04-07 DIAGNOSIS — L89150 Pressure ulcer of sacral region, unstageable: Secondary | ICD-10-CM | POA: Diagnosis not present

## 2017-04-07 NOTE — Telephone Encounter (Signed)
Patient called stating that he apologizes for the way he acted in the office yesterday.  He said he was in a lot of pain, but it started to bother him the way he acted.

## 2017-04-10 ENCOUNTER — Encounter (HOSPITAL_COMMUNITY): Payer: Self-pay | Admitting: Emergency Medicine

## 2017-04-10 ENCOUNTER — Inpatient Hospital Stay (HOSPITAL_COMMUNITY)
Admission: EM | Admit: 2017-04-10 | Discharge: 2017-04-21 | DRG: 571 | Disposition: A | Discharge status: Skilled Nursing Facility | Payer: Medicare Other | Attending: Internal Medicine | Admitting: Internal Medicine

## 2017-04-10 DIAGNOSIS — N183 Chronic kidney disease, stage 3 (moderate): Secondary | ICD-10-CM | POA: Diagnosis not present

## 2017-04-10 DIAGNOSIS — Z955 Presence of coronary angioplasty implant and graft: Secondary | ICD-10-CM

## 2017-04-10 DIAGNOSIS — E1142 Type 2 diabetes mellitus with diabetic polyneuropathy: Secondary | ICD-10-CM | POA: Diagnosis not present

## 2017-04-10 DIAGNOSIS — R739 Hyperglycemia, unspecified: Secondary | ICD-10-CM | POA: Diagnosis not present

## 2017-04-10 DIAGNOSIS — I480 Paroxysmal atrial fibrillation: Secondary | ICD-10-CM | POA: Diagnosis not present

## 2017-04-10 DIAGNOSIS — T502X5A Adverse effect of carbonic-anhydrase inhibitors, benzothiadiazides and other diuretics, initial encounter: Secondary | ICD-10-CM | POA: Diagnosis not present

## 2017-04-10 DIAGNOSIS — E1122 Type 2 diabetes mellitus with diabetic chronic kidney disease: Secondary | ICD-10-CM | POA: Diagnosis present

## 2017-04-10 DIAGNOSIS — G8929 Other chronic pain: Secondary | ICD-10-CM | POA: Diagnosis present

## 2017-04-10 DIAGNOSIS — I2510 Atherosclerotic heart disease of native coronary artery without angina pectoris: Secondary | ICD-10-CM | POA: Diagnosis not present

## 2017-04-10 DIAGNOSIS — S31000A Unspecified open wound of lower back and pelvis without penetration into retroperitoneum, initial encounter: Secondary | ICD-10-CM | POA: Diagnosis not present

## 2017-04-10 DIAGNOSIS — G4733 Obstructive sleep apnea (adult) (pediatric): Secondary | ICD-10-CM | POA: Diagnosis present

## 2017-04-10 DIAGNOSIS — Z87891 Personal history of nicotine dependence: Secondary | ICD-10-CM

## 2017-04-10 DIAGNOSIS — D72829 Elevated white blood cell count, unspecified: Secondary | ICD-10-CM | POA: Diagnosis not present

## 2017-04-10 DIAGNOSIS — D509 Iron deficiency anemia, unspecified: Secondary | ICD-10-CM | POA: Diagnosis present

## 2017-04-10 DIAGNOSIS — E1121 Type 2 diabetes mellitus with diabetic nephropathy: Secondary | ICD-10-CM | POA: Diagnosis present

## 2017-04-10 DIAGNOSIS — E861 Hypovolemia: Secondary | ICD-10-CM | POA: Diagnosis present

## 2017-04-10 DIAGNOSIS — I482 Chronic atrial fibrillation: Secondary | ICD-10-CM | POA: Diagnosis not present

## 2017-04-10 DIAGNOSIS — M479 Spondylosis, unspecified: Secondary | ICD-10-CM | POA: Diagnosis present

## 2017-04-10 DIAGNOSIS — Z794 Long term (current) use of insulin: Secondary | ICD-10-CM

## 2017-04-10 DIAGNOSIS — I130 Hypertensive heart and chronic kidney disease with heart failure and stage 1 through stage 4 chronic kidney disease, or unspecified chronic kidney disease: Secondary | ICD-10-CM | POA: Diagnosis present

## 2017-04-10 DIAGNOSIS — Z89511 Acquired absence of right leg below knee: Secondary | ICD-10-CM | POA: Diagnosis not present

## 2017-04-10 DIAGNOSIS — Z66 Do not resuscitate: Secondary | ICD-10-CM | POA: Diagnosis not present

## 2017-04-10 DIAGNOSIS — T45515A Adverse effect of anticoagulants, initial encounter: Secondary | ICD-10-CM | POA: Diagnosis not present

## 2017-04-10 DIAGNOSIS — E1165 Type 2 diabetes mellitus with hyperglycemia: Secondary | ICD-10-CM | POA: Diagnosis not present

## 2017-04-10 DIAGNOSIS — I5032 Chronic diastolic (congestive) heart failure: Secondary | ICD-10-CM | POA: Diagnosis not present

## 2017-04-10 DIAGNOSIS — T40605A Adverse effect of unspecified narcotics, initial encounter: Secondary | ICD-10-CM | POA: Diagnosis not present

## 2017-04-10 DIAGNOSIS — M19041 Primary osteoarthritis, right hand: Secondary | ICD-10-CM | POA: Diagnosis present

## 2017-04-10 DIAGNOSIS — Z7901 Long term (current) use of anticoagulants: Secondary | ICD-10-CM

## 2017-04-10 DIAGNOSIS — E785 Hyperlipidemia, unspecified: Secondary | ICD-10-CM | POA: Diagnosis present

## 2017-04-10 DIAGNOSIS — D62 Acute posthemorrhagic anemia: Secondary | ICD-10-CM | POA: Diagnosis not present

## 2017-04-10 DIAGNOSIS — Z6841 Body Mass Index (BMI) 40.0 and over, adult: Secondary | ICD-10-CM | POA: Diagnosis not present

## 2017-04-10 DIAGNOSIS — M545 Low back pain: Secondary | ICD-10-CM | POA: Diagnosis not present

## 2017-04-10 DIAGNOSIS — L89109 Pressure ulcer of unspecified part of back, unspecified stage: Secondary | ICD-10-CM | POA: Diagnosis not present

## 2017-04-10 DIAGNOSIS — IMO0002 Reserved for concepts with insufficient information to code with codable children: Secondary | ICD-10-CM

## 2017-04-10 DIAGNOSIS — Z79891 Long term (current) use of opiate analgesic: Secondary | ICD-10-CM

## 2017-04-10 DIAGNOSIS — N189 Chronic kidney disease, unspecified: Secondary | ICD-10-CM | POA: Diagnosis not present

## 2017-04-10 DIAGNOSIS — L89150 Pressure ulcer of sacral region, unstageable: Secondary | ICD-10-CM | POA: Diagnosis not present

## 2017-04-10 DIAGNOSIS — E871 Hypo-osmolality and hyponatremia: Secondary | ICD-10-CM | POA: Diagnosis not present

## 2017-04-10 DIAGNOSIS — M19042 Primary osteoarthritis, left hand: Secondary | ICD-10-CM | POA: Diagnosis present

## 2017-04-10 DIAGNOSIS — I4891 Unspecified atrial fibrillation: Secondary | ICD-10-CM | POA: Diagnosis present

## 2017-04-10 DIAGNOSIS — R58 Hemorrhage, not elsewhere classified: Secondary | ICD-10-CM | POA: Diagnosis not present

## 2017-04-10 DIAGNOSIS — N179 Acute kidney failure, unspecified: Secondary | ICD-10-CM | POA: Diagnosis not present

## 2017-04-10 DIAGNOSIS — K5903 Drug induced constipation: Secondary | ICD-10-CM | POA: Diagnosis not present

## 2017-04-10 DIAGNOSIS — Z7982 Long term (current) use of aspirin: Secondary | ICD-10-CM

## 2017-04-10 MED ORDER — "THROMBI-PAD 3""X3"" EX PADS"
2.0000 | MEDICATED_PAD | Freq: Once | CUTANEOUS | Status: AC
  Administered 2017-04-10: 2 via TOPICAL
  Filled 2017-04-10: qty 2

## 2017-04-10 NOTE — ED Triage Notes (Addendum)
Pt is from nursing facility and being brought in for bleeding from a sacral wound. Pt's sister is the one who called EMS as the facility was trying to control the bleeding for 5 hours. EMS reports the pt was sitting in an adult diaper and same was full of blood. Pt is on blood thinners.

## 2017-04-10 NOTE — ED Notes (Signed)
Delay in lab draw,  Pt receiving peri care at this time. 

## 2017-04-10 NOTE — ED Provider Notes (Signed)
[facility]-EMERGENCY DEPT Provider Note   CSN: [id number] Arrival date & time: 04/10/17  2256     History   Chief Complaint Chief Complaint  Patient presents with  . Wound Infection    HPI [name] is a 66 y.o. male.  HPI  This is an 66 year old male with a history of coronary artery disease, chronic kidney disease, diabetes, hypertension, hyperlipidemia, recent right BKA for osteomyelitis who presents with a bleeding sacral wound. Patient was discharged from hospital on September 17. He was discharged to rehabilitation facility. He states that at discharge he had developed a sacral wound/ulcer.  He is unsure "how bad" it was. EMS report was that patient had progressive worsening bleeding from the wound this evening. Approximate 5 hours of bleeding soaking through multiple pads. Patient's sister visited him and called EMS. Patient is on Xarelto. Does report some dizziness. No chest pain, shortness of breath, abdominal pain.  Denies fevers.  Past Medical History:  Diagnosis Date  . Arthritis    "hands and lower back" (09/19/2014)  . CAD (coronary artery disease)    a. s/p PCI to RCA in 2012. b. prior cath in 01/2014 with elevated L/RH pressures, mild-mod CAD of LAD/LCx with patent RCA, normal EF, c/b CIN/CHF.  [name] Carpal tunnel syndrome, bilateral   . Cellulitis   . Chronic diastolic CHF (congestive heart failure) (HCC)   . Chronic kidney disease (CKD), stage III (moderate)   . Chronic lower back pain   . Diabetes (HCC)   . DKA (diabetic ketoacidoses) (HCC) 03/2017  . History of blood transfusion ~ 1954   "related to OR"  . Hyperlipidemia   . Hypertension   . Hypoxia    a. Qualified for home O2 at [facility] in 09/2014.  [name] Lower GI bleed   . Microcytic anemia   . Morbid obesity (HCC)   . Neuropathy   . OSA (obstructive sleep apnea)    "I wear nasal prongs; haven't been using prongs recently" (09/19/2014)  . PAF (paroxysmal atrial fibrillation) (HCC)    TEE DCCV 09/23/2014  .  Physical deconditioning   . Pilonidal cyst 1980's; 01/25/2013  . Scrotal abscess   . Type II diabetes mellitus [facility])     Patient Active Problem List   Diagnosis Date Noted  . Hyponatremia 04/11/2017  . ARF (acute renal failure) (HCC) 04/11/2017  . Pressure injury of skin 03/24/2017  . Below knee amputation status, right (HCC) 03/23/2017  . DKA (diabetic ketoacidoses) (HCC) 03/14/2017  . Elevated troponin 03/14/2017  . Diabetic polyneuropathy associated with type 2 diabetes mellitus (HCC)   . Osteoarthritis of both knees   . Diabetic nephropathy associated with type 2 diabetes mellitus (HCC)   . Bilateral lower leg cellulitis 01/22/2017  . Venous stasis 08/06/2016  . At high risk for falls 07/21/2016  . CHF exacerbation (HCC) 06/10/2016  . Lactic acidosis 06/09/2016  . Syncope and collapse 03/05/2016  . Acute on chronic renal insufficiency 03/05/2016  . Obesity 12/30/2015  . Scrotal swelling   . Foot fracture   . 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 Lower GI bleed   . Pressure ulcer 12/24/2015  . Acute kidney injury (HCC) 12/23/2015  . Back pain 12/23/2015  . Fall   . Metatarsal fracture   . Traumatic rhabdomyolysis (HCC)   . Hyperkalemia   . Lisfranc dislocation   . Hyperglycemia   . Sepsis, unspecified organism (HCC) 09/08/2015  . Scrotal wall abscess 09/08/2015  . AKI (acute  kidney injury) (HCC) 09/08/2015  . Chest wall pain   . On amiodarone therapy 12/08/2014  . Respiratory failure with hypoxia (HCC) 12/04/2014  . Hypoxia 12/04/2014  . Acute on chronic respiratory failure with hypoxia (HCC)   . Atrial fibrillation with RVR (HCC) 09/20/2014  . Acute on chronic diastolic CHF (congestive heart failure) (HCC) 09/19/2014  . Acute respiratory failure (HCC) 09/19/2014  . Abnormal nuclear cardiac imaging test 02/12/2014  . Dyspnea 02/12/2014  . Chronic anticoagulation- Coumadin 02/12/2014  . CKD (chronic kidney disease) stage 3, GFR 30-59 ml/min  (HCC) 02/12/2014  . Chronic diastolic congestive heart failure (HCC) 02/11/2014  . Encounter for therapeutic drug monitoring 08/03/2013  . Pilonidal cyst 01/25/2013  . Perirectal cellulitis 09/06/2012  . Long term (current) use of anticoagulants 11/18/2011  . Obstructive sleep apnea- unable to tolerate c-pap 08/04/2011  . CAD S/P percutaneous coronary angioplasty 08/04/2011  . ANEMIA-IRON DEFICIENCY 01/06/2009  . OTHER AND UNSPECIFIED COAGULATION DEFECTS 01/06/2009  . ANEMIA 01/02/2009  . CARDIOVASCULAR FUNCTION STUDY, ABNORMAL 01/02/2009  . Essential hypertension, benign 10/22/2008  . Atrial fibrillation 10/22/2008  . Hyperlipidemia 09/23/2008  . HYPOKALEMIA 09/23/2008  . Obesity, Class III, BMI 40-49.9 (morbid obesity) (HCC) 09/23/2008  . CELLULITIS AND ABSCESS OF LEG EXCEPT FOOT 09/23/2008    Past Surgical History:  Procedure Laterality Date  . ABDOMINAL SURGERY  ~ 1954   BENIGN TUMOR REMOVED  . AMPUTATION Right 03/23/2017   Procedure: RIGHT BELOW KNEE AMPUTATION;  Surgeon: Nadara Mustard, MD;  Location[facility] OR;  Service: Orthopedics;  Laterality: Right;  . APPENDECTOMY    . BELOW KNEE LEG AMPUTATION Right 03/23/2017  . CARDIOVERSION  2010   [name] 09/19/2014  . CARDIOVERSION N/A 09/23/2014   Procedure: CARDIOVERSION;  Surgeon: Lewayne Bunting, MD;  Location: [facility] ENDOSCOPY;  Service: Cardiovascular;  Laterality: N/A;  . CARDIOVERSION N/A 12/11/2014   Procedure: CARDIOVERSION;  Surgeon: Thurmon Fair, MD;  Location: [facility] ENDOSCOPY;  Service: Cardiovascular;  Laterality: N/A;  . CATARACT EXTRACTION W/PHACO Right 11/15/2012   Procedure: CATARACT EXTRACTION PHACO AND INTRAOCULAR LENS PLACEMENT (IOC);  Surgeon: Shade Flood, MD;  Location: [facility] OR;  Service: Ophthalmology;  Laterality: Right;  . CATARACT EXTRACTION W/PHACO Left 11/29/2012   Procedure: CATARACT EXTRACTION PHACO AND INTRAOCULAR LENS PLACEMENT (IOC);  Surgeon: Shade Flood, MD;  Location: [facility] OR;  Service: Ophthalmology;   Laterality: Left;  . CORONARY ANGIOPLASTY WITH STENT PLACEMENT  August 2012   RCA DES - [facility]  . DEBRIDEMENT  FOOT Left    debriding diabetic foot ulcers  . EYE SURGERY    . FOREIGN BODY REMOVAL Right 2014   heel,  splinter removal   . LEFT AND RIGHT HEART CATHETERIZATION WITH CORONARY ANGIOGRAM N/A 01/31/2014   Procedure: LEFT AND RIGHT HEART CATHETERIZATION WITH CORONARY ANGIOGRAM;  Surgeon: Micheline Chapman, MD;  Location: [facility] CATH LAB;  Service: Cardiovascular;  Laterality: N/A;  . PARS PLANA VITRECTOMY Left 06/05/2013   Procedure: PARS PLANA VITRECTOMY WITH 23 GAUGE with Endolaser(constellation);  Surgeon: Shade Flood, MD;  Location: [facility] OR;  Service: Ophthalmology;  Laterality: Left;  . PILONIDAL CYST EXCISION N/A 01/08/2013   Procedure: CYST EXCISION PILONIDAL EXTENSIVE;  Surgeon: Axel Filler, MD;  Location: [facility] OR;  Service: General;  Laterality: N/A;  . PILONIDAL CYST EXCISION  1980's   "in [place]"  . TEE WITHOUT CARDIOVERSION N/A 09/23/2014   Procedure: TRANSESOPHAGEAL ECHOCARDIOGRAM (TEE);  Surgeon: Lewayne Bunting, MD;  Location: [facility] ENDOSCOPY;  Service: Cardiovascular;  Laterality: N/A;  .  TONSILLECTOMY         Home Medications    Prior to Admission medications   Medication Sig Start Date End Date Taking? Authorizing Provider  acetaminophen (TYLENOL) 500 MG tablet Take 1,000 mg by mouth 3 (three) times daily.    [provider]  aspirin EC 81 MG EC tablet Take 1 tablet (81 mg total) by mouth daily. 06/17/16   Richarda Overlie, MD  atorvastatin (LIPITOR) 80 MG tablet Take 1 tablet (80 mg total) by mouth daily. Patient taking differently: Take 80 mg by mouth at bedtime.  05/25/16   [name], MD  diltiazem (CARDIZEM CD) 240 MG 24 hr capsule Take 1 capsule (240 mg total) by mouth daily. 03/18/17   Calvert Cantor, MD  docusate sodium (COLACE) 100 MG capsule Take 1 capsule (100 mg total) by mouth daily. 01/26/17   [name], MD    gabapentin (NEURONTIN) 300 MG capsule Take 2 capsules (600 mg total) by mouth 2 (two) times daily. 05/25/16   [name], MD  HUMULIN R 500 UNIT/ML injection INJECT 0.56 MILLILITERS INTO V-GO PUMP DAILY, DX CODE E11.65 04/04/17   Reather Littler, MD  hydrocerin (EUCERIN) CREA Apply 1 application topically 3 (three) times daily. legs 05/25/16   Langeland, Alvis Lemmings T, MD  Hypromellose (NATURAL BALANCE TEARS OP) Place 1 drop into both eyes daily as needed (for dry eyes).     [provider]  insulin aspart (NOVOLOG) 100 UNIT/ML injection Inject 10 Units into the skin See admin instructions. Inject 10 units subcutaneously after meals if BS is greater than 150 and eats greater than 50% meal    [provider]  Insulin Glargine (LANTUS SOLOSTAR) 100 UNIT/ML Solostar Pen Inject 50 Units into the skin daily.     [provider]  KLOR-CON M20 20 MEQ tablet TAKE 2 TABLETS BY MOUTH TWICE A DAY Patient taking differently: TAKE 40 MEQ BY MOUTH TWICE A DAY 02/04/17   Lewayne Bunting, MD  metolazone (ZAROXOLYN) 5 MG tablet Take 5mg  twice weekly on Mondays and Fridays. Patient taking differently: Take 5 mg by mouth See admin instructions. Take 5mg  twice weekly on Mondays and Fridays. 09/06/16   Bensimhon, Bevelyn Buckles, MD  metoprolol tartrate (LOPRESSOR) 50 MG tablet Take 1 tablet (50 mg total) by mouth 2 (two) times daily. 01/27/17   Quentin Angst, MD  Multiple Vitamin (MULTIVITAMIN WITH MINERALS) TABS Take 1 tablet by mouth daily.    [provider]  oxyCODONE (ROXICODONE) 15 MG immediate release tablet Take 1 tablet (15 mg total) by mouth every 4 (four) hours as needed for pain. Hold for Sedation 03/25/17   Nadara Mustard, MD  OXYGEN Inhale 2-3 L into the lungs continuous.    [provider]  polyethylene glycol (MIRALAX / GLYCOLAX) packet Take 17 g by mouth daily. 01/26/17   [name], MD  spironolactone (ALDACTONE) 25 MG tablet Take 25 mg by mouth daily.     [provider]  torsemide (DEMADEX) 20 MG tablet Take 4 tablets (80 mg total) by mouth 2 (two) times daily. 06/28/16   Meng, Wynema Birch, PA  XARELTO 20 MG TABS tablet TAKE 1 TABLET BY MOUTH EVERY DAY WITH SUPPER 04/05/17   Quentin Angst, MD    Family History Family History  Problem Relation Age of Onset  . Adopted: Yes  . Other Other        PT ADOPTED    Social History Social History  Substance Use Topics  .  Smoking status: Former Smoker    Packs/day: 1.00    Years: 20.00    Types: Cigarettes    Quit date: 07/13/1983  . Smokeless tobacco: Never Used  . Alcohol use No     Allergies   Patient has no known allergies.   Review of Systems Review of Systems  Constitutional: Negative for fever.  Respiratory: Negative for shortness of breath.   Cardiovascular: Negative for chest pain.  Gastrointestinal: Negative for abdominal pain, nausea and vomiting.  Skin: Positive for wound. Negative for color change.  Neurological: Positive for dizziness. Negative for numbness and headaches.  All other systems reviewed and are negative.    Physical Exam Updated Vital Signs BP (!) 113/55   Pulse 73   Temp 98.4 F (36.9 C) (Rectal)   Resp 13   Ht 6\' 3"  (1.905 m)   Wt (!) 163.3 kg (360 lb)   SpO2 100%   BMI 45.00 kg/m   Physical Exam  Constitutional: He is oriented to person, place, and time.  Chronically ill-appearing, morbidly obese, no acute distress  HENT:  Head: Normocephalic and atraumatic.  Cardiovascular: Normal rate, regular rhythm and normal heart sounds.   Pulmonary/Chest: Effort normal and breath sounds normal. No respiratory distress. He has no wheezes.  Abdominal: Soft. There is no tenderness.  Extensive abdominal scarring  Musculoskeletal: He exhibits no edema.  Right BKA, incision clean dry and intact, no erythema  Neurological: He is alert and oriented to person, place, and time.  Skin: Skin is warm and dry.  Stage III sacral decubitus ulcer with  blood oozing noted just at the gluteal cleft Dry skin noted bilateral lower legs  Psychiatric: He has a normal mood and affect.  Nursing note and vitals reviewed.    ED Treatments / Results  Labs (all labs ordered are listed, but only abnormal results are displayed) Labs Reviewed  CBC WITH DIFFERENTIAL/PLATELET - Abnormal; Notable for the following:       Result Value   WBC 15.3 (*)    RBC 4.13 (*)    Hemoglobin 8.8 (*)    HCT 29.0 (*)    MCV 70.2 (*)    MCH 21.3 (*)    RDW 19.4 (*)    Platelets 446 (*)    Neutro Abs 11.6 (*)    Monocytes Absolute 1.2 (*)    All other components within normal limits  BASIC METABOLIC PANEL - Abnormal; Notable for the following:    Sodium 124 (*)    Chloride 84 (*)    Glucose, Bld 391 (*)    BUN 49 (*)    Creatinine, Ser 1.81 (*)    GFR calc non Af Amer 38 (*)    GFR calc Af Amer 44 (*)    All other components within normal limits  CBG MONITORING, ED - Abnormal; Notable for the following:    Glucose-Capillary 406 (*)    All other components within normal limits    EKG  EKG Interpretation None       Radiology No results found.  Procedures Wound repair Date/Time: 04/11/2017 1:25 AM Performed by: 06/11/2017 Authorized by: Shon Baton  Consent: Verbal consent obtained. Risks and benefits: risks, benefits and alternatives were discussed Consent given by: patient Local anesthesia used: no  Anesthesia: Local anesthesia used: no  Sedation: Patient sedated: no Patient tolerance: Patient tolerated the procedure well with no immediate complications Comments: Silver nitrate was applied to a small bleeder at the gluteal cleft, thrombin pad  was applied with direct pressure to control bleeding    (including critical care time)  Medications Ordered in ED Medications  THROMBI-PAD 3"X3" pad 2 each (not administered)  insulin aspart (novoLOG) injection 10 Units (not administered)  oxyCODONE (Oxy IR/ROXICODONE)  immediate release tablet 15 mg (15 mg Oral Given 04/11/17 0107)  sodium chloride 0.9 % bolus 1,000 mL (1,000 mLs Intravenous New Bag/Given 04/11/17 0110)     Initial Impression / Assessment and Plan / ED Course  I have reviewed the triage vital signs and the nursing notes.  Pertinent labs & imaging results that were available during my care of the patient were reviewed by me and considered in my medical decision making (see chart for details).     Patient presents with a bleeding sacral decubitus ulcer. He is on Xarelto. He is nontoxic-appearing. Vital signs reassuring. Chronically ill-appearing but in no acute distress. Bleeding was addressed and stabilized. Small venous bleeder. Likely, located by Xarelto use. No signs or symptoms of active infection. Hemoglobin 8.8. Most recent hemoglobin 9.2. He does have a slight elevation in his white count to 15.3. No left shift and no fevers. BMP with a sodium of 124, chloride of 84, and glucose of 391. Anion gap is 15.  Corrected sodium 129. Normal corrected sodium 140. Patient does have a history of DKA and is borderline but normal bicarbonate. He appears somewhat clinically dry which could be contributing.  Patient was given a liter of fluids. He reports getting his nightly Lantus. He was also given 10 units of insulin.   Feel patient would benefit from fluids with repeat sodium and glucose check. Discussed with Dr. [name].  Final Clinical Impressions(s) / ED Diagnoses   Final diagnoses:  Hyponatremia  Hyperglycemia  Wound of sacral region, initial encounter    New Prescriptions New Prescriptions   No medications on file     Shon Baton, MD 04/11/17 [phone]

## 2017-04-11 ENCOUNTER — Encounter (HOSPITAL_COMMUNITY): Payer: Self-pay | Admitting: General Practice

## 2017-04-11 DIAGNOSIS — R739 Hyperglycemia, unspecified: Secondary | ICD-10-CM | POA: Diagnosis not present

## 2017-04-11 DIAGNOSIS — E871 Hypo-osmolality and hyponatremia: Secondary | ICD-10-CM

## 2017-04-11 DIAGNOSIS — N179 Acute kidney failure, unspecified: Secondary | ICD-10-CM | POA: Diagnosis not present

## 2017-04-11 LAB — CBC
HCT: 24.8 % — ABNORMAL LOW (ref 39.0–52.0)
HEMOGLOBIN: 7.6 g/dL — AB (ref 13.0–17.0)
MCH: 21.6 pg — ABNORMAL LOW (ref 26.0–34.0)
MCHC: 30.6 g/dL (ref 30.0–36.0)
MCV: 70.5 fL — ABNORMAL LOW (ref 78.0–100.0)
Platelets: 417 10*3/uL — ABNORMAL HIGH (ref 150–400)
RBC: 3.52 MIL/uL — ABNORMAL LOW (ref 4.22–5.81)
RDW: 19.5 % — ABNORMAL HIGH (ref 11.5–15.5)
WBC: 16.6 10*3/uL — AB (ref 4.0–10.5)

## 2017-04-11 LAB — HEMOGLOBIN A1C
Hgb A1c MFr Bld: 9.6 % — ABNORMAL HIGH (ref 4.8–5.6)
Mean Plasma Glucose: 228.82 mg/dL

## 2017-04-11 LAB — BASIC METABOLIC PANEL
Anion gap: 15 (ref 5–15)
BUN: 49 mg/dL — AB (ref 6–20)
CHLORIDE: 84 mmol/L — AB (ref 101–111)
CO2: 25 mmol/L (ref 22–32)
CREATININE: 1.81 mg/dL — AB (ref 0.61–1.24)
Calcium: 9 mg/dL (ref 8.9–10.3)
GFR calc Af Amer: 44 mL/min — ABNORMAL LOW (ref >60)
GFR calc non Af Amer: 38 mL/min — ABNORMAL LOW (ref >60)
GLUCOSE: 391 mg/dL — AB (ref 65–99)
POTASSIUM: 3.9 mmol/L (ref 3.5–5.1)
SODIUM: 124 mmol/L — AB (ref 135–145)

## 2017-04-11 LAB — COMPREHENSIVE METABOLIC PANEL
ALK PHOS: 87 U/L (ref 38–126)
ALT: 11 U/L — ABNORMAL LOW (ref 17–63)
ANION GAP: 19 — AB (ref 5–15)
AST: 21 U/L (ref 15–41)
Albumin: 2.5 g/dL — ABNORMAL LOW (ref 3.5–5.0)
BILIRUBIN TOTAL: 1.4 mg/dL — AB (ref 0.3–1.2)
BUN: 50 mg/dL — ABNORMAL HIGH (ref 6–20)
CALCIUM: 8.6 mg/dL — AB (ref 8.9–10.3)
CO2: 19 mmol/L — ABNORMAL LOW (ref 22–32)
Chloride: 87 mmol/L — ABNORMAL LOW (ref 101–111)
Creatinine, Ser: 1.93 mg/dL — ABNORMAL HIGH (ref 0.61–1.24)
GFR calc non Af Amer: 35 mL/min — ABNORMAL LOW (ref >60)
GFR, EST AFRICAN AMERICAN: 40 mL/min — AB (ref >60)
Glucose, Bld: 435 mg/dL — ABNORMAL HIGH (ref 65–99)
Potassium: 4.3 mmol/L (ref 3.5–5.1)
SODIUM: 125 mmol/L — AB (ref 135–145)
TOTAL PROTEIN: 7.5 g/dL (ref 6.5–8.1)

## 2017-04-11 LAB — CBC WITH DIFFERENTIAL/PLATELET
BASOS ABS: 0 10*3/uL (ref 0.0–0.1)
Basophils Relative: 0 %
Eosinophils Absolute: 0.5 10*3/uL (ref 0.0–0.7)
Eosinophils Relative: 3 %
HCT: 29 % — ABNORMAL LOW (ref 39.0–52.0)
Hemoglobin: 8.8 g/dL — ABNORMAL LOW (ref 13.0–17.0)
LYMPHS PCT: 13 %
Lymphs Abs: 2 10*3/uL (ref 0.7–4.0)
MCH: 21.3 pg — AB (ref 26.0–34.0)
MCHC: 30.3 g/dL (ref 30.0–36.0)
MCV: 70.2 fL — AB (ref 78.0–100.0)
MONOS PCT: 8 %
Monocytes Absolute: 1.2 10*3/uL — ABNORMAL HIGH (ref 0.1–1.0)
NEUTROS ABS: 11.6 10*3/uL — AB (ref 1.7–7.7)
Neutrophils Relative %: 76 %
Platelets: 446 10*3/uL — ABNORMAL HIGH (ref 150–400)
RBC: 4.13 MIL/uL — AB (ref 4.22–5.81)
RDW: 19.4 % — ABNORMAL HIGH (ref 11.5–15.5)
WBC: 15.3 10*3/uL — AB (ref 4.0–10.5)

## 2017-04-11 LAB — OSMOLALITY: OSMOLALITY: 303 mosm/kg — AB (ref 275–295)

## 2017-04-11 LAB — CBG MONITORING, ED
GLUCOSE-CAPILLARY: 424 mg/dL — AB (ref 65–99)
GLUCOSE-CAPILLARY: 417 mg/dL — AB (ref 65–99)
Glucose-Capillary: 406 mg/dL — ABNORMAL HIGH (ref 65–99)

## 2017-04-11 LAB — CORTISOL: Cortisol, Plasma: 25.7 ug/dL

## 2017-04-11 LAB — TSH: TSH: 0.448 u[IU]/mL (ref 0.350–4.500)

## 2017-04-11 LAB — GLUCOSE, CAPILLARY
GLUCOSE-CAPILLARY: 354 mg/dL — AB (ref 65–99)
GLUCOSE-CAPILLARY: 298 mg/dL — AB (ref 65–99)

## 2017-04-11 MED ORDER — SODIUM CHLORIDE 0.9 % IV BOLUS (SEPSIS)
1000.0000 mL | Freq: Once | INTRAVENOUS | Status: AC
  Administered 2017-04-11: 1000 mL via INTRAVENOUS

## 2017-04-11 MED ORDER — INSULIN GLARGINE 100 UNIT/ML ~~LOC~~ SOLN
70.0000 [IU] | Freq: Every day | SUBCUTANEOUS | Status: DC
  Administered 2017-04-11 – 2017-04-12 (×2): 70 [IU] via SUBCUTANEOUS
  Filled 2017-04-11 (×2): qty 0.7

## 2017-04-11 MED ORDER — INSULIN ASPART 100 UNIT/ML ~~LOC~~ SOLN
0.0000 [IU] | Freq: Three times a day (TID) | SUBCUTANEOUS | Status: DC
  Administered 2017-04-11 – 2017-04-12 (×3): 20 [IU] via SUBCUTANEOUS
  Administered 2017-04-12: 15 [IU] via SUBCUTANEOUS
  Administered 2017-04-12: 40 [IU] via SUBCUTANEOUS
  Administered 2017-04-13: 11 [IU] via SUBCUTANEOUS
  Administered 2017-04-13: 4 [IU] via SUBCUTANEOUS
  Administered 2017-04-13: 11 [IU] via SUBCUTANEOUS
  Administered 2017-04-14: 15 [IU] via SUBCUTANEOUS
  Administered 2017-04-14: 11 [IU] via SUBCUTANEOUS
  Administered 2017-04-14: 4 [IU] via SUBCUTANEOUS
  Administered 2017-04-15: 11 [IU] via SUBCUTANEOUS
  Administered 2017-04-15 – 2017-04-16 (×2): 4 [IU] via SUBCUTANEOUS
  Administered 2017-04-16: 20 [IU] via SUBCUTANEOUS
  Administered 2017-04-16: 11 [IU] via SUBCUTANEOUS
  Administered 2017-04-17: 20 [IU] via SUBCUTANEOUS
  Administered 2017-04-17: 11 [IU] via SUBCUTANEOUS
  Administered 2017-04-18 (×2): 20 [IU] via SUBCUTANEOUS
  Administered 2017-04-18: 4 [IU] via SUBCUTANEOUS
  Administered 2017-04-19: 15 [IU] via SUBCUTANEOUS
  Administered 2017-04-19: 11 [IU] via SUBCUTANEOUS
  Administered 2017-04-19: 4 [IU] via SUBCUTANEOUS
  Administered 2017-04-20: 7 [IU] via SUBCUTANEOUS
  Administered 2017-04-20: 20 [IU] via SUBCUTANEOUS
  Administered 2017-04-21: 4 [IU] via SUBCUTANEOUS
  Administered 2017-04-21 (×2): 3 [IU] via SUBCUTANEOUS
  Filled 2017-04-11: qty 1

## 2017-04-11 MED ORDER — GABAPENTIN 300 MG PO CAPS
600.0000 mg | ORAL_CAPSULE | Freq: Two times a day (BID) | ORAL | Status: DC
  Administered 2017-04-11 – 2017-04-21 (×22): 600 mg via ORAL
  Filled 2017-04-11 (×22): qty 2

## 2017-04-11 MED ORDER — OXYCODONE HCL 5 MG PO TABS
15.0000 mg | ORAL_TABLET | ORAL | Status: DC | PRN
  Administered 2017-04-11 – 2017-04-12 (×5): 15 mg via ORAL
  Filled 2017-04-11 (×5): qty 3

## 2017-04-11 MED ORDER — ATORVASTATIN CALCIUM 80 MG PO TABS
80.0000 mg | ORAL_TABLET | Freq: Every day | ORAL | Status: DC
  Administered 2017-04-11 – 2017-04-21 (×11): 80 mg via ORAL
  Filled 2017-04-11 (×11): qty 1

## 2017-04-11 MED ORDER — OXYCODONE HCL 5 MG PO TABS
15.0000 mg | ORAL_TABLET | Freq: Once | ORAL | Status: AC
  Administered 2017-04-11: 15 mg via ORAL
  Filled 2017-04-11: qty 3

## 2017-04-11 MED ORDER — DOCUSATE SODIUM 100 MG PO CAPS
100.0000 mg | ORAL_CAPSULE | Freq: Every day | ORAL | Status: DC
  Administered 2017-04-11 – 2017-04-21 (×8): 100 mg via ORAL
  Filled 2017-04-11 (×10): qty 1

## 2017-04-11 MED ORDER — INSULIN ASPART 100 UNIT/ML ~~LOC~~ SOLN
0.0000 [IU] | Freq: Every day | SUBCUTANEOUS | Status: DC
  Administered 2017-04-11: 3 [IU] via SUBCUTANEOUS
  Administered 2017-04-19: 2 [IU] via SUBCUTANEOUS

## 2017-04-11 MED ORDER — INSULIN ASPART 100 UNIT/ML ~~LOC~~ SOLN
0.0000 [IU] | Freq: Three times a day (TID) | SUBCUTANEOUS | Status: DC
Start: 2017-04-11 — End: 2017-04-11
  Administered 2017-04-11: 10 [IU] via SUBCUTANEOUS
  Filled 2017-04-11: qty 1

## 2017-04-11 MED ORDER — POTASSIUM CHLORIDE CRYS ER 20 MEQ PO TBCR
40.0000 meq | EXTENDED_RELEASE_TABLET | Freq: Two times a day (BID) | ORAL | Status: DC
  Administered 2017-04-11 – 2017-04-17 (×13): 40 meq via ORAL
  Filled 2017-04-11 (×13): qty 2

## 2017-04-11 MED ORDER — TRANEXAMIC ACID 1000 MG/10ML IV SOLN
500.0000 mg | Freq: Once | INTRAVENOUS | Status: AC
  Administered 2017-04-11: 500 mg via TOPICAL
  Filled 2017-04-11: qty 10

## 2017-04-11 MED ORDER — INSULIN ASPART 100 UNIT/ML ~~LOC~~ SOLN
10.0000 [IU] | Freq: Once | SUBCUTANEOUS | Status: AC
  Administered 2017-04-11: 10 [IU] via SUBCUTANEOUS
  Filled 2017-04-11: qty 1

## 2017-04-11 MED ORDER — INSULIN ASPART 100 UNIT/ML ~~LOC~~ SOLN: 40.0000 [IU] | Freq: Three times a day (TID) | SUBCUTANEOUS | Status: DC

## 2017-04-11 MED ORDER — DILTIAZEM HCL ER COATED BEADS 120 MG PO CP24
240.0000 mg | ORAL_CAPSULE | Freq: Every day | ORAL | Status: DC
  Administered 2017-04-11 – 2017-04-21 (×10): 240 mg via ORAL
  Filled 2017-04-11 (×5): qty 2
  Filled 2017-04-11: qty 1
  Filled 2017-04-11 (×6): qty 2

## 2017-04-11 MED ORDER — ACETAMINOPHEN 650 MG RE SUPP: 650.0000 mg | Freq: Four times a day (QID) | RECTAL | Status: DC | PRN

## 2017-04-11 MED ORDER — SPIRONOLACTONE 25 MG PO TABS
25.0000 mg | ORAL_TABLET | Freq: Every day | ORAL | Status: DC
  Administered 2017-04-11: 25 mg via ORAL
  Filled 2017-04-11 (×2): qty 1

## 2017-04-11 MED ORDER — TORSEMIDE 20 MG PO TABS
80.0000 mg | ORAL_TABLET | Freq: Two times a day (BID) | ORAL | Status: DC
  Administered 2017-04-11 (×2): 80 mg via ORAL
  Filled 2017-04-11 (×2): qty 4

## 2017-04-11 MED ORDER — INSULIN ASPART 100 UNIT/ML ~~LOC~~ SOLN: 0.0000 [IU] | Freq: Every day | SUBCUTANEOUS | Status: DC

## 2017-04-11 MED ORDER — ACETAMINOPHEN 325 MG PO TABS
650.0000 mg | ORAL_TABLET | Freq: Four times a day (QID) | ORAL | Status: DC | PRN
  Administered 2017-04-11 – 2017-04-14 (×2): 650 mg via ORAL
  Filled 2017-04-11 (×2): qty 2

## 2017-04-11 MED ORDER — SODIUM CHLORIDE 0.9 % IV SOLN
INTRAVENOUS | Status: DC
  Administered 2017-04-11: 07:00:00 via INTRAVENOUS

## 2017-04-11 MED ORDER — ADULT MULTIVITAMIN W/MINERALS CH
1.0000 | ORAL_TABLET | Freq: Every day | ORAL | Status: DC
  Administered 2017-04-11 – 2017-04-21 (×11): 1 via ORAL
  Filled 2017-04-11 (×11): qty 1

## 2017-04-11 MED ORDER — METOPROLOL TARTRATE 50 MG PO TABS
50.0000 mg | ORAL_TABLET | Freq: Two times a day (BID) | ORAL | Status: DC
  Administered 2017-04-11 (×2): 50 mg via ORAL
  Filled 2017-04-11: qty 1
  Filled 2017-04-11: qty 2
  Filled 2017-04-11: qty 1

## 2017-04-11 NOTE — ED Notes (Signed)
Patient resting on bariatric bed.  No complaints at present. Patient repositioned in bed. And gown given. Breakfast tray offered, ate very little.

## 2017-04-11 NOTE — ED Notes (Signed)
Lunch tray given. 

## 2017-04-11 NOTE — Progress Notes (Signed)
Patient admitted after midnight, please see H&P.  Appears that decubitus ulcer bleed after debridement.  Was cauterized in the ER.  Now with AKI, and hyponatremia.  C/o neuropathy in hands as well.  Patient has poorly controlled DM.  Suspect patient will be here for several days.  If no further bleeding, will re-start blood thinners in AM.  Marlin Canary DO

## 2017-04-11 NOTE — ED Notes (Signed)
Called pharmacy for tranexamic acid

## 2017-04-11 NOTE — ED Notes (Signed)
Pt placed on bar. Bed for comfort.

## 2017-04-11 NOTE — ED Notes (Signed)
Pt given diet ginger ale per Kelly(RN)

## 2017-04-11 NOTE — ED Notes (Signed)
Per Dr. [name], acuity level changed to 3.

## 2017-04-11 NOTE — ED Notes (Signed)
Pt CBG was 424, notified Kelly(RN)

## 2017-04-11 NOTE — H&P (Addendum)
TRH H&P   Patient Demographics:    [name], is a 66 y.o. male  MRN: [id number]   DOB - August 10, 1950  Admit Date - 04/10/2017  Outpatient Primary MD for the patient is [name], MD  Referring MD/NP/PA: dr ward  Outpatient Specialists:  Gus Height  Patient coming from: [facility]   Chief Complaint  Patient presents with  . Wound Infection      HPI:    [name]  is a 66 y.o. male, w hypertension, hyperlipidemia, Dm2, w R BKA,  CKD stage3, Pafib, CAD apparently presented due to bleeding from large sacral decubitus ulcer.  Pt sent to ED for evaluation.   In ED,  Hgb 8.8, Na 124, Glucose 391, Bun 49/Creat 1.81, pt will be admitted for Acute on CRF, and hyponatremia and bleeding from sacral ulcer    Review of systems:    In addition to the HPI above,  No Fever-chills, No Headache, No changes with Vision or hearing, No problems swallowing food or Liquids, No Chest pain, Cough or Shortness of Breath, No Abdominal pain, No Nausea or Vommitting, Bowel movements are regular, No Blood in stool or Urine, No dysuria, No new skin rashes or bruises, No new joints pains-aches,  No new weakness, tingling, numbness in any extremity, No recent weight gain or loss, No polyuria, polydypsia or polyphagia, No significant Mental Stressors.  A full 10 point Review of Systems was done, except as stated above, all other Review of Systems were negative.   With Past History of the following :    Past Medical History:  Diagnosis Date  . Arthritis    "hands and lower back" (09/19/2014)  . CAD (coronary artery disease)    a. s/p PCI to RCA in 2012. b. prior cath in 01/2014 with elevated L/RH pressures, mild-mod CAD of LAD/LCx with patent RCA, normal EF, c/b CIN/CHF.  [name] Carpal tunnel syndrome, bilateral   . Cellulitis   . Chronic diastolic  CHF (congestive heart failure) (HCC)   . Chronic kidney disease (CKD), stage III (moderate)   . Chronic lower back pain   . Diabetes (HCC)   . DKA (diabetic ketoacidoses) (HCC) 03/2017  . History of blood transfusion ~ 1954   "related to OR"  . Hyperlipidemia   . Hypertension   . Hypoxia    a. Qualified for home O2 at DC in 09/2014.  [name] Lower GI bleed   . Microcytic anemia   . Morbid obesity (HCC)   . Neuropathy   . OSA (obstructive sleep apnea)    "I wear nasal prongs; haven't been using prongs recently" (09/19/2014)  . PAF (paroxysmal atrial fibrillation) (HCC)    TEE DCCV 09/23/2014  . Physical deconditioning   . Pilonidal cyst 1980's; 01/25/2013  . Scrotal abscess   . Type II diabetes mellitus (HCC)  Past Surgical History:  Procedure Laterality Date  . ABDOMINAL SURGERY  ~ 1954   BENIGN TUMOR REMOVED  . AMPUTATION Right 03/23/2017   Procedure: RIGHT BELOW KNEE AMPUTATION;  Surgeon: Nadara Mustard, MD;  Location: [facility] OR;  Service: Orthopedics;  Laterality: Right;  . APPENDECTOMY    . BELOW KNEE LEG AMPUTATION Right 03/23/2017  . CARDIOVERSION  2010   [name] 09/19/2014  . CARDIOVERSION N/A 09/23/2014   Procedure: CARDIOVERSION;  Surgeon: Lewayne Bunting, MD;  Location: [facility] ENDOSCOPY;  Service: Cardiovascular;  Laterality: N/A;  . CARDIOVERSION N/A 12/11/2014   Procedure: CARDIOVERSION;  Surgeon: Thurmon Fair, MD;  Location: [facility] ENDOSCOPY;  Service: Cardiovascular;  Laterality: N/A;  . CATARACT EXTRACTION W/PHACO Right 11/15/2012   Procedure: CATARACT EXTRACTION PHACO AND INTRAOCULAR LENS PLACEMENT (IOC);  Surgeon: Shade Flood, MD;  Location: [facility] OR;  Service: Ophthalmology;  Laterality: Right;  . CATARACT EXTRACTION W/PHACO Left 11/29/2012   Procedure: CATARACT EXTRACTION PHACO AND INTRAOCULAR LENS PLACEMENT (IOC);  Surgeon: Shade Flood, MD;  Location: [facility] OR;  Service: Ophthalmology;  Laterality: Left;  . CORONARY ANGIOPLASTY WITH STENT PLACEMENT  August 2012   RCA DES -  [facility]  . DEBRIDEMENT  FOOT Left    debriding diabetic foot ulcers  . EYE SURGERY    . FOREIGN BODY REMOVAL Right 2014   heel,  splinter removal   . LEFT AND RIGHT HEART CATHETERIZATION WITH CORONARY ANGIOGRAM N/A 01/31/2014   Procedure: LEFT AND RIGHT HEART CATHETERIZATION WITH CORONARY ANGIOGRAM;  Surgeon: Micheline Chapman, MD;  Location: [facility] CATH LAB;  Service: Cardiovascular;  Laterality: N/A;  . PARS PLANA VITRECTOMY Left 06/05/2013   Procedure: PARS PLANA VITRECTOMY WITH 23 GAUGE with Endolaser(constellation);  Surgeon: Shade Flood, MD;  Location: [facility] OR;  Service: Ophthalmology;  Laterality: Left;  . PILONIDAL CYST EXCISION N/A 01/08/2013   Procedure: CYST EXCISION PILONIDAL EXTENSIVE;  Surgeon: Axel Filler, MD;  Location: [facility] OR;  Service: General;  Laterality: N/A;  . PILONIDAL CYST EXCISION  1980's   "in [place]"  . TEE WITHOUT CARDIOVERSION N/A 09/23/2014   Procedure: TRANSESOPHAGEAL ECHOCARDIOGRAM (TEE);  Surgeon: Lewayne Bunting, MD;  Location: [facility] ENDOSCOPY;  Service: Cardiovascular;  Laterality: N/A;  . TONSILLECTOMY        Social History:     Social History  Substance Use Topics  . Smoking status: Former Smoker    Packs/day: 1.00    Years: 20.00    Types: Cigarettes    Quit date: 07/13/1983  . Smokeless tobacco: Never Used  . Alcohol use No     Lives - at [facility] - unable to walk without assistance   Family History :     Family History  Problem Relation Age of Onset  . Adopted: Yes  . Other Other        PT ADOPTED      Home Medications:   Prior to Admission medications   Medication Sig Start Date End Date Taking? Authorizing Provider  acetaminophen (TYLENOL) 500 MG tablet Take 1,000 mg by mouth 3 (three) times daily.    [provider]  aspirin EC 81 MG EC tablet Take 1 tablet (81 mg total) by mouth daily. 06/17/16   Richarda Overlie, MD  atorvastatin (LIPITOR) 80 MG tablet Take 1 tablet  (80 mg total) by mouth daily. Patient taking differently: Take 80 mg by mouth at bedtime.  05/25/16   [name], MD  diltiazem (CARDIZEM CD) 240 MG 24  hr capsule Take 1 capsule (240 mg total) by mouth daily. 03/18/17   Calvert Cantor, MD  docusate sodium (COLACE) 100 MG capsule Take 1 capsule (100 mg total) by mouth daily. 01/26/17   [name], MD  gabapentin (NEURONTIN) 300 MG capsule Take 2 capsules (600 mg total) by mouth 2 (two) times daily. 05/25/16   [name], MD  HUMULIN R 500 UNIT/ML injection INJECT 0.56 MILLILITERS INTO V-GO PUMP DAILY, DX CODE E11.65 04/04/17   Reather Littler, MD  hydrocerin (EUCERIN) CREA Apply 1 application topically 3 (three) times daily. legs 05/25/16   Langeland, Alvis Lemmings T, MD  Hypromellose (NATURAL BALANCE TEARS OP) Place 1 drop into both eyes daily as needed (for dry eyes).     [provider]  insulin aspart (NOVOLOG) 100 UNIT/ML injection Inject 10 Units into the skin See admin instructions. Inject 10 units subcutaneously after meals if BS is greater than 150 and eats greater than 50% meal    [provider]  Insulin Glargine (LANTUS SOLOSTAR) 100 UNIT/ML Solostar Pen Inject 50 Units into the skin daily.     [provider]  KLOR-CON M20 20 MEQ tablet TAKE 2 TABLETS BY MOUTH TWICE A DAY Patient taking differently: TAKE 40 MEQ BY MOUTH TWICE A DAY 02/04/17   Lewayne Bunting, MD  metolazone (ZAROXOLYN) 5 MG tablet Take 5mg  twice weekly on Mondays and Fridays. Patient taking differently: Take 5 mg by mouth See admin instructions. Take 5mg  twice weekly on Mondays and Fridays. 09/06/16   Bensimhon, 11-29-1988, MD  metoprolol tartrate (LOPRESSOR) 50 MG tablet Take 1 tablet (50 mg total) by mouth 2 (two) times daily. 01/27/17   Bevelyn Buckles, MD  Multiple Vitamin (MULTIVITAMIN WITH MINERALS) TABS Take 1 tablet by mouth daily.    [provider]  oxyCODONE (ROXICODONE) 15 MG immediate release tablet Take 1 tablet (15 mg  total) by mouth every 4 (four) hours as needed for pain. Hold for Sedation 03/25/17   Quentin Angst, MD  OXYGEN Inhale 2-3 L into the lungs continuous.    [provider]  polyethylene glycol (MIRALAX / GLYCOLAX) packet Take 17 g by mouth daily. 01/26/17   Nadara Mustard, MD  spironolactone (ALDACTONE) 25 MG tablet Take 25 mg by mouth daily.    [provider]  torsemide (DEMADEX) 20 MG tablet Take 4 tablets (80 mg total) by mouth 2 (two) times daily. 06/28/16   Meng, [name], PA  XARELTO 20 MG TABS tablet TAKE 1 TABLET BY MOUTH EVERY DAY WITH SUPPER 04/05/17   Wynema Birch, MD     Allergies:    No Known Allergies   Physical Exam:   Vitals  Blood pressure (!) 113/55, pulse 73, temperature 98.4 F (36.9 C), temperature source Rectal, resp. rate 13, height 6\' 3"  (1.905 m), weight (!) 163.3 kg (360 lb), SpO2 100 %.   1. General  lying in bed in NAD,    2. Normal affect and insight, Not Suicidal or Homicidal, Awake Alert, Oriented X 3.  3. No F.N deficits, ALL C.Nerves Intact, Strength 5/5 all 4 extremities, Sensation intact all 4 extremities, Plantars down going.  4. Ears and Eyes appear Normal, Conjunctivae clear, PERRLA. Moist Oral Mucosa.  5. Supple Neck, No JVD, No cervical lymphadenopathy appriciated, No Carotid Bruits.  6. Symmetrical Chest wall movement, Good air movement bilaterally, CTAB.  7. RRR, No Gallops, Rubs or Murmurs, No Parasternal Heave.  8. Positive Bowel Sounds, Abdomen Soft, No tenderness, No  organomegaly appriciated,No rebound -guarding or rigidity.  9.  No Cyanosis, Normal Skin Turgor,  10. Good muscle tone,  joints appear normal , no effusions, Normal ROM.  11. No Palpable Lymph Nodes in Neck or Axillae  8cm decubitus ulcer with bleeding R BKA   Data Review:    CBC  Recent Labs Lab 04/11/17 0001  WBC 15.3*  HGB 8.8*  HCT 29.0*  PLT 446*  MCV 70.2*  MCH 21.3*  MCHC 30.3  RDW 19.4*  LYMPHSABS 2.0  MONOABS 1.2*    EOSABS 0.5  BASOSABS 0.0   ------------------------------------------------------------------------------------------------------------------  Chemistries   Recent Labs Lab 04/11/17 0001  NA 124*  K 3.9  CL 84*  CO2 25  GLUCOSE 391*  BUN 49*  CREATININE 1.81*  CALCIUM 9.0   ------------------------------------------------------------------------------------------------------------------ estimated creatinine clearance is 66.8 mL/min (A) (by C-G formula based on SCr of 1.81 mg/dL (H)). ------------------------------------------------------------------------------------------------------------------ No results for input(s): TSH, T4TOTAL, T3FREE, THYROIDAB in the last 72 hours.  Invalid input(s): FREET3  Coagulation profile No results for input(s): INR, PROTIME in the last 168 hours. ------------------------------------------------------------------------------------------------------------------- No results for input(s): DDIMER in the last 72 hours. -------------------------------------------------------------------------------------------------------------------  Cardiac Enzymes No results for input(s): CKMB, TROPONINI, MYOGLOBIN in the last 168 hours.  Invalid input(s): CK ------------------------------------------------------------------------------------------------------------------    Component Value Date/Time   BNP 414.8 (H) 03/14/2017 0412   BNP 130.0 (H) 03/11/2016 1445     ---------------------------------------------------------------------------------------------------------------  Urinalysis    Component Value Date/Time   COLORURINE YELLOW 03/15/2017 1253   APPEARANCEUR HAZY (A) 03/15/2017 1253   LABSPEC 1.017 03/15/2017 1253   PHURINE 5.0 03/15/2017 1253   GLUCOSEU NEGATIVE 03/15/2017 1253   HGBUR NEGATIVE 03/15/2017 1253   BILIRUBINUR NEGATIVE 03/15/2017 1253   KETONESUR NEGATIVE 03/15/2017 1253   PROTEINUR NEGATIVE 03/15/2017 1253    UROBILINOGEN 1.0 11/27/2014 0755   NITRITE NEGATIVE 03/15/2017 1253   LEUKOCYTESUR NEGATIVE 03/15/2017 1253    ----------------------------------------------------------------------------------------------------------------   Imaging Results:    No results found.    Assessment & Plan:    Active Problems:   Hyperglycemia   Hyponatremia   ARF (acute renal failure) (HCC)    Hyperglycemia fsbs ac and qhs, iss Check cmp in am  ARF on CRF Hydrate with ns iv Check cmp in am  Hyponatremia ? From diuretics Check urine osm, urine sodium,  Serum osm, tsh, cortisol Hold zaroxolyn Hydrate with ns iv Check cmp in am  Bleeding from sacral ulcer, cauterized by ED Wound care consult  Anemia Check cbc in am  Pafib, on eliquis Hold eliquis due to bleeding, can restart tomorrow if bleeding stops   DVT Prophylaxis Heparin -  Lovenox - SCDs  AM Labs Ordered, also please review Full Orders  Family Communication: Admission, patients condition and plan of care including tests being ordered have been discussed with the patient  who indicate understanding and agree with the plan and Code Status.  Code Status DNR  Likely DC to  home  Condition GUARDED    Consults called: none  Admission status: observation  Time spent in minutes : 45   Pearson Grippe M.D on 04/11/2017 at 1:32 AM  Between 7am to 7pm - Pager - [phone]   After 7pm go to [email or web address] - password [facility]  [name] - Office  [phone]

## 2017-04-11 NOTE — Progress Notes (Signed)
Pt admitted to unit from ED. Large sacral wound, unstageable. Changed packing with wet to dry dressing, ABD and tape. Left great toe with small necrotic area and deep tissue injury to heel. Foams applied. Open area on scrotum and healed area on penis. Wound consult placed and foams applied. Will continue to monitor pt closely. Arrietty Dercole R

## 2017-04-11 NOTE — ED Notes (Signed)
Condom catheter applied.

## 2017-04-11 NOTE — ED Notes (Signed)
Pt CBG was 417, notified Kelly(RN)

## 2017-04-12 DIAGNOSIS — E785 Hyperlipidemia, unspecified: Secondary | ICD-10-CM | POA: Diagnosis present

## 2017-04-12 DIAGNOSIS — Z89511 Acquired absence of right leg below knee: Secondary | ICD-10-CM | POA: Diagnosis not present

## 2017-04-12 DIAGNOSIS — E871 Hypo-osmolality and hyponatremia: Secondary | ICD-10-CM | POA: Diagnosis not present

## 2017-04-12 DIAGNOSIS — N183 Chronic kidney disease, stage 3 (moderate): Secondary | ICD-10-CM | POA: Diagnosis present

## 2017-04-12 DIAGNOSIS — E1142 Type 2 diabetes mellitus with diabetic polyneuropathy: Secondary | ICD-10-CM | POA: Diagnosis present

## 2017-04-12 DIAGNOSIS — G4733 Obstructive sleep apnea (adult) (pediatric): Secondary | ICD-10-CM | POA: Diagnosis present

## 2017-04-12 DIAGNOSIS — D62 Acute posthemorrhagic anemia: Secondary | ICD-10-CM | POA: Diagnosis not present

## 2017-04-12 DIAGNOSIS — Z6841 Body Mass Index (BMI) 40.0 and over, adult: Secondary | ICD-10-CM | POA: Diagnosis not present

## 2017-04-12 DIAGNOSIS — Z66 Do not resuscitate: Secondary | ICD-10-CM | POA: Diagnosis present

## 2017-04-12 DIAGNOSIS — I480 Paroxysmal atrial fibrillation: Secondary | ICD-10-CM | POA: Diagnosis present

## 2017-04-12 DIAGNOSIS — I5032 Chronic diastolic (congestive) heart failure: Secondary | ICD-10-CM | POA: Diagnosis present

## 2017-04-12 DIAGNOSIS — I482 Chronic atrial fibrillation: Secondary | ICD-10-CM | POA: Diagnosis not present

## 2017-04-12 DIAGNOSIS — G8929 Other chronic pain: Secondary | ICD-10-CM | POA: Diagnosis present

## 2017-04-12 DIAGNOSIS — E1121 Type 2 diabetes mellitus with diabetic nephropathy: Secondary | ICD-10-CM | POA: Diagnosis present

## 2017-04-12 DIAGNOSIS — E1165 Type 2 diabetes mellitus with hyperglycemia: Secondary | ICD-10-CM | POA: Diagnosis present

## 2017-04-12 DIAGNOSIS — D509 Iron deficiency anemia, unspecified: Secondary | ICD-10-CM | POA: Diagnosis present

## 2017-04-12 DIAGNOSIS — S31000A Unspecified open wound of lower back and pelvis without penetration into retroperitoneum, initial encounter: Secondary | ICD-10-CM | POA: Diagnosis not present

## 2017-04-12 DIAGNOSIS — R739 Hyperglycemia, unspecified: Secondary | ICD-10-CM | POA: Diagnosis not present

## 2017-04-12 DIAGNOSIS — N179 Acute kidney failure, unspecified: Secondary | ICD-10-CM | POA: Diagnosis not present

## 2017-04-12 DIAGNOSIS — N189 Chronic kidney disease, unspecified: Secondary | ICD-10-CM | POA: Diagnosis not present

## 2017-04-12 DIAGNOSIS — I130 Hypertensive heart and chronic kidney disease with heart failure and stage 1 through stage 4 chronic kidney disease, or unspecified chronic kidney disease: Secondary | ICD-10-CM | POA: Diagnosis present

## 2017-04-12 DIAGNOSIS — E1122 Type 2 diabetes mellitus with diabetic chronic kidney disease: Secondary | ICD-10-CM | POA: Diagnosis present

## 2017-04-12 DIAGNOSIS — M545 Low back pain: Secondary | ICD-10-CM | POA: Diagnosis present

## 2017-04-12 DIAGNOSIS — T45515A Adverse effect of anticoagulants, initial encounter: Secondary | ICD-10-CM | POA: Diagnosis present

## 2017-04-12 DIAGNOSIS — E861 Hypovolemia: Secondary | ICD-10-CM | POA: Diagnosis not present

## 2017-04-12 DIAGNOSIS — I2510 Atherosclerotic heart disease of native coronary artery without angina pectoris: Secondary | ICD-10-CM | POA: Diagnosis present

## 2017-04-12 DIAGNOSIS — L89150 Pressure ulcer of sacral region, unstageable: Secondary | ICD-10-CM | POA: Diagnosis not present

## 2017-04-12 LAB — CBC
HEMATOCRIT: 22.2 % — AB (ref 39.0–52.0)
Hemoglobin: 6.8 g/dL — CL (ref 13.0–17.0)
MCH: 21.1 pg — ABNORMAL LOW (ref 26.0–34.0)
MCHC: 30.2 g/dL (ref 30.0–36.0)
MCV: 69.8 fL — ABNORMAL LOW (ref 78.0–100.0)
PLATELETS: 367 10*3/uL (ref 150–400)
RBC: 3.18 MIL/uL — AB (ref 4.22–5.81)
RDW: 18.9 % — AB (ref 11.5–15.5)
WBC: 16.5 10*3/uL — AB (ref 4.0–10.5)

## 2017-04-12 LAB — GLUCOSE, CAPILLARY
GLUCOSE-CAPILLARY: 318 mg/dL — AB (ref 65–99)
GLUCOSE-CAPILLARY: 189 mg/dL — AB (ref 65–99)
Glucose-Capillary: 436 mg/dL — ABNORMAL HIGH (ref 65–99)
Glucose-Capillary: 394 mg/dL — ABNORMAL HIGH (ref 65–99)

## 2017-04-12 LAB — BASIC METABOLIC PANEL
ANION GAP: 14 (ref 5–15)
BUN: 55 mg/dL — ABNORMAL HIGH (ref 6–20)
CALCIUM: 8.6 mg/dL — AB (ref 8.9–10.3)
CO2: 28 mmol/L (ref 22–32)
Chloride: 84 mmol/L — ABNORMAL LOW (ref 101–111)
Creatinine, Ser: 2.06 mg/dL — ABNORMAL HIGH (ref 0.61–1.24)
GFR, EST AFRICAN AMERICAN: 37 mL/min — AB (ref >60)
GFR, EST NON AFRICAN AMERICAN: 32 mL/min — AB (ref >60)
Glucose, Bld: 378 mg/dL — ABNORMAL HIGH (ref 65–99)
POTASSIUM: 3.9 mmol/L (ref 3.5–5.1)
Sodium: 126 mmol/L — ABNORMAL LOW (ref 135–145)

## 2017-04-12 LAB — OSMOLALITY, URINE: Osmolality, Ur: 293 mOsm/kg — ABNORMAL LOW (ref 300–900)

## 2017-04-12 LAB — SODIUM, URINE, RANDOM: SODIUM UR: 36 mmol/L

## 2017-04-12 LAB — PREPARE RBC (CROSSMATCH)

## 2017-04-12 MED ORDER — SPIRONOLACTONE 25 MG PO TABS
25.0000 mg | ORAL_TABLET | Freq: Every day | ORAL | Status: DC
  Administered 2017-04-13 – 2017-04-19 (×7): 25 mg via ORAL
  Filled 2017-04-12 (×8): qty 1

## 2017-04-12 MED ORDER — TORSEMIDE 20 MG PO TABS
80.0000 mg | ORAL_TABLET | Freq: Two times a day (BID) | ORAL | Status: DC
  Administered 2017-04-13 – 2017-04-16 (×8): 80 mg via ORAL
  Filled 2017-04-12 (×9): qty 4

## 2017-04-12 MED ORDER — OXYCODONE HCL 5 MG PO TABS
20.0000 mg | ORAL_TABLET | ORAL | Status: DC | PRN
  Administered 2017-04-12 – 2017-04-15 (×13): 20 mg via ORAL
  Filled 2017-04-12 (×14): qty 4

## 2017-04-12 MED ORDER — INSULIN GLARGINE 100 UNIT/ML ~~LOC~~ SOLN
80.0000 [IU] | Freq: Every day | SUBCUTANEOUS | Status: DC
  Administered 2017-04-13 – 2017-04-14 (×2): 80 [IU] via SUBCUTANEOUS
  Filled 2017-04-12 (×2): qty 0.8

## 2017-04-12 MED ORDER — METOPROLOL TARTRATE 50 MG PO TABS
50.0000 mg | ORAL_TABLET | Freq: Two times a day (BID) | ORAL | Status: DC
  Administered 2017-04-12 – 2017-04-21 (×17): 50 mg via ORAL
  Filled 2017-04-12 (×18): qty 1

## 2017-04-12 MED ORDER — LIDOCAINE HCL (PF) 1 % IJ SOLN: 20.0000 mL | Freq: Once | INTRAMUSCULAR | Status: AC

## 2017-04-12 MED ORDER — INSULIN ASPART 100 UNIT/ML ~~LOC~~ SOLN
20.0000 [IU] | Freq: Three times a day (TID) | SUBCUTANEOUS | Status: DC
  Administered 2017-04-12 – 2017-04-20 (×24): 20 [IU] via SUBCUTANEOUS

## 2017-04-12 MED ORDER — INSULIN ASPART 100 UNIT/ML ~~LOC~~ SOLN: 40.0000 [IU] | Freq: Three times a day (TID) | SUBCUTANEOUS | Status: DC

## 2017-04-12 MED ORDER — SODIUM CHLORIDE 0.9 % IV SOLN
Freq: Once | INTRAVENOUS | Status: AC
  Administered 2017-04-12: 13:00:00 via INTRAVENOUS

## 2017-04-12 NOTE — Progress Notes (Signed)
Inpatient Diabetes Program Recommendations  AACE/ADA: New Consensus Statement on Inpatient Glycemic Control (2015)  Target Ranges:  Prepandial:   less than 140 mg/dL      Peak postprandial:   less than 180 mg/dL (1-2 hours)      Critically ill patients:  140 - 180 mg/dL   Lab Results  Component Value Date   GLUCAP 436 (H) 04/12/2017   HGBA1C 9.6 (H) 04/11/2017    Review of Glycemic Control Results for [name], [name] (MRN [id number]) as of 04/12/2017 12:34  Ref. Range 04/11/2017 07:47 04/11/2017 12:06 04/11/2017 16:09 04/11/2017 22:04 04/12/2017 08:16  Glucose-Capillary Latest Ref Range: 65 - 99 mg/dL 188 (H) 416 (H) 606 (H) 298 (H) 436 (H)   Diabetes history: Type 2 Outpatient Diabetes medications: Toujeo 70 units qam, Novolog 40 units tid Current orders for Inpatient glycemic control: Novolog 20 units tid, Novolog 0-20 units tid, Novolog 0-5 units qhs, Lantus 70 units qam   Inpatient Diabetes Program Recommendations: Consider increasing Lantus 80 units qam- fasting blood sugars 436mg /dl.   , RN, BA, MHA, CDE Diabetes Coordinator Inpatient Diabetes Program  [phone] (Team Pager) [phone] [facility] Office) 04/12/2017 12:38 PM

## 2017-04-12 NOTE — Progress Notes (Signed)
Patients BP was 104/47 and pulse 73. MD notified. Orders followed. Will continue to monitor.

## 2017-04-12 NOTE — Progress Notes (Signed)
Dressings changed per order. Will continue to monitor.  

## 2017-04-12 NOTE — Progress Notes (Signed)
CRITICAL VALUE ALERT  Critical Value:  436  Date & Time Notied:  04/12/17 5573  Provider Notified: Benjamine Mola  Orders Received/Actions taken: MD notified. Orders given and followed. Will continue to monitor.

## 2017-04-12 NOTE — Progress Notes (Signed)
PROGRESS NOTE    [name]  YIR:[id number] DOB: [birth year] DOA: 04/10/2017 PCP: [name], MD   Outpatient Specialists:     Brief Narrative:  [name]  is a 66 y.o. male, w hypertension, hyperlipidemia, Dm2, w R BKA,  CKD stage3, Pafib, CAD apparently presented due to bleeding from large sacral decubitus ulcer.  Pt sent to ED for evaluation.   In ED,  Hgb 8.8, Na 124, Glucose 391, Bun 49/Creat 1.81, pt will be admitted for Acute on CRF, and hyponatremia and bleeding from sacral ulcer   Assessment & Plan:   Principal Problem:   Hyperglycemia Active Problems:   Hyponatremia   ARF (acute renal failure) (HCC)   Uncontrolled DM with Hyperglycemia SSI -resume home doses of medications and monitor -will need further titration -was at one time on U500 and was supposed to get insulin gtt  ARF on CKD stage III -monitor (Cr with wide range in chart 1.5-2.2)  Hyponatremia  -TSH ok -correct some with hyperglycemia -improving  Bleeding from sacral ulcer, cauterized by ED Wound care consult -wet to dry dressing ordered in ER for now  Anemia From blood loss anemia -plan to transfuse PRBCs  Pafib, on eliquis -Holding blood thinner due to AKI and bleeding  Chronic pain -increase oxy IR -follows with pain management as an outpatient  Morbid obesity Body mass index is 45 kg/m.  DVT prophylaxis:  SCD's  Code Status: DNR   Family Communication: None at bedside  Disposition Plan:  Back to SNF   Consultants:       Subjective: C/o pain in butt, and leg where surgery was  Objective: Vitals:   04/12/17 0542 04/12/17 1011 04/12/17 1200 04/12/17 1236  BP: (!) 114/49 (!) 104/47 (!) 131/50 (!) 104/47  Pulse: 76 73 72   Resp: 18 18 18    Temp: 97.8 F (36.6 C) 97.7 F (36.5 C) 97.8 F (36.6 C)   TempSrc: Oral Oral Oral   SpO2: 100% 100% 100%   Weight:      Height:        Intake/Output Summary (Last 24 hours) at 04/12/17  1242 Last data filed at 04/12/17 0931  Gross per 24 hour  Intake             1062 ml  Output             2550 ml  Net            -1488 ml   Filed Weights   04/10/17 2305  Weight: (!) 163.3 kg (360 lb)    Examination:  General exam: morbid obese Respiratory system: diminished, no wheezing Cardiovascular system: irrr Gastrointestinal system: +Bs, soft, obese Central nervous system: Alert- NAD Extremities: right leg wrapped with BKA Psychiatry: Judgement and insight appear normal. Mood & affect appropriate.     Data Reviewed: I have personally reviewed following labs and imaging studies  CBC:  Recent Labs Lab 04/11/17 0001 04/11/17 0428 04/12/17 0414  WBC 15.3* 16.6* 16.5*  NEUTROABS 11.6*  --   --   HGB 8.8* 7.6* 6.8*  HCT 29.0* 24.8* 22.2*  MCV 70.2* 70.5* 69.8*  PLT 446* 417* 367   Basic Metabolic Panel:  Recent Labs Lab 04/11/17 0001 04/11/17 0428 04/12/17 0414  NA 124* 125* 126*  K 3.9 4.3 3.9  CL 84* 87* 84*  CO2 25 19* 28  GLUCOSE 391* 435* 378*  BUN 49* 50* 55*  CREATININE 1.81* 1.93* 2.06*  CALCIUM 9.0 8.6* 8.6*  GFR: Estimated Creatinine Clearance: 58.7 mL/min (A) (by C-G formula based on SCr of 2.06 mg/dL (H)). Liver Function Tests:  Recent Labs Lab 04/11/17 0428  AST 21  ALT 11*  ALKPHOS 87  BILITOT 1.4*  PROT 7.5  ALBUMIN 2.5*   No results for input(s): LIPASE, AMYLASE in the last 168 hours. No results for input(s): AMMONIA in the last 168 hours. Coagulation Profile: No results for input(s): INR, PROTIME in the last 168 hours. Cardiac Enzymes: No results for input(s): CKTOTAL, CKMB, CKMBINDEX, TROPONINI in the last 168 hours. BNP (last 3 results) No results for input(s): PROBNP in the last 8760 hours. HbA1C:  Recent Labs  04/11/17 0830  HGBA1C 9.6*   CBG:  Recent Labs Lab 04/11/17 0747 04/11/17 1206 04/11/17 1609 04/11/17 2204 04/12/17 0816  GLUCAP 424* 417* 354* 298* 436*   Lipid Profile: No results for  input(s): CHOL, HDL, LDLCALC, TRIG, CHOLHDL, LDLDIRECT in the last 72 hours. Thyroid Function Tests:  Recent Labs  04/11/17 0428  TSH 0.448   Anemia Panel: No results for input(s): VITAMINB12, FOLATE, FERRITIN, TIBC, IRON, RETICCTPCT in the last 72 hours. Urine analysis:    Component Value Date/Time   COLORURINE YELLOW 03/15/2017 1253   APPEARANCEUR HAZY (A) 03/15/2017 1253   LABSPEC 1.017 03/15/2017 1253   PHURINE 5.0 03/15/2017 1253   GLUCOSEU NEGATIVE 03/15/2017 1253   HGBUR NEGATIVE 03/15/2017 1253   BILIRUBINUR NEGATIVE 03/15/2017 1253   KETONESUR NEGATIVE 03/15/2017 1253   PROTEINUR NEGATIVE 03/15/2017 1253   UROBILINOGEN 1.0 11/27/2014 0755   NITRITE NEGATIVE 03/15/2017 1253   LEUKOCYTESUR NEGATIVE 03/15/2017 1253     )No results found for this or any previous visit (from the past 240 hour(s)).    Anti-infectives    None       Radiology Studies: No results found.      Scheduled Meds: . atorvastatin  80 mg Oral QHS  . diltiazem  240 mg Oral Daily  . docusate sodium  100 mg Oral Daily  . gabapentin  600 mg Oral BID  . insulin aspart  0-20 Units Subcutaneous TID WC  . insulin aspart  0-5 Units Subcutaneous QHS  . insulin aspart  20 Units Subcutaneous TID WC  . insulin glargine  70 Units Subcutaneous Daily  . metoprolol tartrate  50 mg Oral BID  . multivitamin with minerals  1 tablet Oral Daily  . potassium chloride SA  40 mEq Oral BID  . [START ON 04/13/2017] spironolactone  25 mg Oral Daily  . [START ON 04/13/2017] torsemide  80 mg Oral BID   Continuous Infusions: . sodium chloride       LOS: 0 days    Time spent: 35 min    Verle Brillhart U Luchiano Viscomi, DO [name] Pager [phone]  If 7PM-7AM, please contact night-coverage [email or web address] Password [email or web address] 04/12/2017, 12:42 PM

## 2017-04-12 NOTE — Care Management Obs Status (Signed)
MEDICARE OBSERVATION STATUS NOTIFICATION   Patient Details  Name: [name] MRN: [id number] Date of Birth: 1951/06/14   Medicare Observation Status Notification Given:  Yes    Lawerance Sabal, RN 04/12/2017, 10:44 AM

## 2017-04-12 NOTE — Consult Note (Signed)
WOC Nurse wound consult note Reason for Consult: wounds Wound type: Sacrum: Unstageable pressure injury: 10cm x 10cm x 0.1cm  Right stump: lateral 0.3cm x 1.5cm x with tunneling at 4 o'clock 5cm and 10-12 o'clock 4cm  Right stump: medial: 2cm x 1cm x 0.3cm  Pressure Injury POA: Yes Measurement:see above Wound bed: Sacrum: 100% black  Stump; medial fluctuant but pink; lateral tunneling with dark base, appears to be old blood Drainage (amount, consistency, odor) moderate, with odor, green from sacrum; dark bloody/minimal no odor from stump Periwound: intact  Dressing procedure/placement/frequency: Enzymatic debridement to the sacral wound, appears black but they did use silver nitrate yesterday to attempt to stop bleeding, however it should be noted the tissue is not healthy.   Consider surgical debridement and if not than hydrotherapy, however with recent bleeding I have discussed with hospitalist and she will consult surgery to evaluate.  LALM in place for pressure redistribution, will need at the time of DC to SNF Needs pressure redistribution cushion if he is to be up on WC at all Iodoform packing to the stump wound to keep it open and allow to drain, change daily, I have notified Dr. Lajoyce Corners of his admission and he agrees with my POC for this patient.  Silver hydrofiber to the medial side, change daily Maximize nutrition for wound healing.  WOC nurse will follow along with team for support for wound care.  Hosie Sharman [facility], CNS, [employer] [phone]

## 2017-04-12 NOTE — Consult Note (Signed)
[facility] Consult Note  [name] 25-Nov-1950  [id number].    Requesting MD: Eulogio Bear Chief Complaint/Reason for Consult: Sacral ulcer  HPI:  [name] is a 66yo male PMH atrial fibrillation of Eliquis who was admitted to [facility] yesterday with bleeding from a chronic sacral wound. Patient states the ulcer has been present for several weeks. It worsened following right BKA 9/12. Patient is currently in SNF for rehab. States that someone debrided his wound at bedside on 9/30. He had taken eliquis that day and they were unable to stop the bleeding therefore he was brought to the ED. Upon presentation to the ED he was found to have hemoglobin 8.8 and hematocrit 29. H&H dropped to 6.8/22.2 the following day and he was transfused PRBC. Eliquis has been held since admission. Wound care consulted to evaluated wound and recommended general surgery consult.   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   HENT: Negative.   Eyes: Negative.   Respiratory: Negative.   Cardiovascular: Positive for leg swelling.  Gastrointestinal: Negative.   Genitourinary: Negative.   Musculoskeletal: Positive for joint pain.  Skin:       Sacral wound  Neurological: Negative.   All systems reviewed and otherwise negative except for as above  Family History  Problem Relation Age of Onset  . Adopted: Yes  . Other Other        PT ADOPTED    Past Medical History:  Diagnosis Date  . Arthritis    "hands and lower back" (09/19/2014)  . CAD (coronary artery disease)    a. s/p PCI to RCA in 2012. b. prior cath in 01/2014 with elevated L/RH pressures, mild-mod CAD of LAD/LCx with patent RCA, normal EF, c/b CIN/CHF.  [name] Carpal tunnel syndrome, bilateral   . Cellulitis   . Chronic  diastolic CHF (congestive heart failure) ([place])   . Chronic kidney disease (CKD), stage III (moderate) (HCC)   . Chronic lower back pain   . Diabetes ([place])   . DKA (diabetic ketoacidoses) ([place]) 03/2017  . History of blood transfusion ~ 1954   "related to OR"  . Hyperlipidemia   . Hypertension   . Hypoxia    a. Qualified for home O2 at [place] in 09/2014.  [name] Lower GI bleed   . Microcytic anemia   . Morbid obesity ([place])   . Neuropathy   . OSA (obstructive sleep apnea)    "I wear nasal prongs; haven't been using prongs recently" (09/19/2014)  . PAF (paroxysmal atrial fibrillation) ([place])    TEE DCCV 09/23/2014  . Physical deconditioning   . Pilonidal cyst 1980's; 01/25/2013  . Scrotal abscess   . Type II diabetes mellitus ([place])     Past Surgical History:  Procedure Laterality Date  . ABDOMINAL SURGERY  ~ 1954   BENIGN TUMOR REMOVED  . AMPUTATION Right 03/23/2017   Procedure: RIGHT BELOW KNEE AMPUTATION;  Surgeon: Newt Minion, MD;  Location: [place];  Service: Orthopedics;  Laterality: Right;  . APPENDECTOMY    . BELOW KNEE LEG AMPUTATION Right 03/23/2017  . CARDIOVERSION  2010   Archie Endo 09/19/2014  . CARDIOVERSION N/A 09/23/2014   Procedure: CARDIOVERSION;  Surgeon: Lelon Perla, MD;  Location: [facility] ENDOSCOPY;  Service: Cardiovascular;  Laterality: N/A;  . CARDIOVERSION N/A  12/11/2014   Procedure: CARDIOVERSION;  Surgeon: Sanda Klein, MD;  Location: [place] ENDOSCOPY;  Service: Cardiovascular;  Laterality: N/A;  . CATARACT EXTRACTION W/PHACO Right 11/15/2012   Procedure: CATARACT EXTRACTION PHACO AND INTRAOCULAR LENS PLACEMENT ([place]);  Surgeon: Adonis Brook, MD;  Location: [place];  Service: Ophthalmology;  Laterality: Right;  . CATARACT EXTRACTION W/PHACO Left 11/29/2012   Procedure: CATARACT EXTRACTION PHACO AND INTRAOCULAR LENS PLACEMENT (IOC);  Surgeon: Adonis Brook, MD;  Location: [place];  Service: Ophthalmology;  Laterality: Left;  . CORONARY ANGIOPLASTY WITH STENT PLACEMENT  August 2012    RCA DES - [facility]  . DEBRIDEMENT  FOOT Left    debriding diabetic foot ulcers  . EYE SURGERY    . FOREIGN BODY REMOVAL Right 2014   heel,  splinter removal   . LEFT AND RIGHT HEART CATHETERIZATION WITH CORONARY ANGIOGRAM N/A 01/31/2014   Procedure: LEFT AND RIGHT HEART CATHETERIZATION WITH CORONARY ANGIOGRAM;  Surgeon: Blane Ohara, MD;  [facility] LAB;  Service: Cardiovascular;  Laterality: N/A;  . PARS PLANA VITRECTOMY Left 06/05/2013   Procedure: PARS PLANA VITRECTOMY WITH 23 GAUGE with Endolaser(constellation);  Surgeon: Adonis Brook, MD;  Location: [place];  Service: Ophthalmology;  Laterality: Left;  . PILONIDAL CYST EXCISION N/A 01/08/2013   Procedure: CYST EXCISION PILONIDAL EXTENSIVE;  Surgeon: Ralene Ok, MD;  Location: [place];  Service: General;  Laterality: N/A;  . PILONIDAL CYST EXCISION  1980's   "in [place]"  . TEE WITHOUT CARDIOVERSION N/A 09/23/2014   Procedure: TRANSESOPHAGEAL ECHOCARDIOGRAM (TEE);  Surgeon: Lelon Perla, MD;  Location: [facility] ENDOSCOPY;  Service: Cardiovascular;  Laterality: N/A;  . TONSILLECTOMY      Social History:  reports that he quit smoking about 33 years ago. His smoking use included Cigarettes. He has a 20.00 pack-year smoking history. He has never used smokeless tobacco. He reports that he does not drink alcohol or use drugs.  Allergies: No Known Allergies  Medications Prior to Admission  Medication Sig Dispense Refill  . acetaminophen (TYLENOL) 500 MG tablet Take 500 mg by mouth 3 (three) times daily.     [name] aspirin EC 81 MG EC tablet Take 1 tablet (81 mg total) by mouth daily. 30 tablet 2  . atorvastatin (LIPITOR) 80 MG tablet Take 1 tablet (80 mg total) by mouth daily. (Patient taking differently: Take 80 mg by mouth at bedtime. ) 30 tablet 10  . collagenase (SANTYL) ointment Apply 1 application topically daily.    [name] diltiazem (CARDIZEM CD) 240 MG 24 hr capsule Take 1 capsule (240 mg total) by mouth  daily.    [name] docusate sodium (COLACE) 100 MG capsule Take 1 capsule (100 mg total) by mouth daily.    [name] gabapentin (NEURONTIN) 300 MG capsule Take 2 capsules (600 mg total) by mouth 2 (two) times daily. 60 capsule 3  . hydrocerin (EUCERIN) CREA Apply 1 application topically 3 (three) times daily. legs 454 g 3  . insulin aspart (NOVOLOG) 100 UNIT/ML injection Inject 40 Units into the skin 3 (three) times daily after meals.     . Insulin Glargine (TOUJEO SOLOSTAR) 300 UNIT/ML SOPN Inject 70 Units into the skin every morning.    [name] KLOR-CON M20 20 MEQ tablet TAKE 2 TABLETS BY MOUTH TWICE A DAY (Patient taking differently: TAKE 40 MEQ BY MOUTH TWICE A DAY) 120 tablet 3  . metolazone (ZAROXOLYN) 5 MG tablet Take 63m twice weekly on Mondays and Fridays. (Patient taking differently: Take 5 mg by  mouth See admin instructions. Take 40m twice weekly on Mondays and Fridays.) 10 tablet 11  . metoprolol tartrate (LOPRESSOR) 50 MG tablet Take 1 tablet (50 mg total) by mouth 2 (two) times daily. 60 tablet 0  . Multiple Vitamin (MULTIVITAMIN WITH MINERALS) TABS Take 1 tablet by mouth daily.    .Marland KitchenoxyCODONE (ROXICODONE) 15 MG immediate release tablet Take 1 tablet (15 mg total) by mouth every 4 (four) hours as needed for pain. Hold for Sedation 60 tablet 0  . OXYGEN Inhale 2-3 L into the lungs continuous.    .Marland Kitchenspironolactone (ALDACTONE) 25 MG tablet Take 25 mg by mouth daily.    .Marland Kitchentorsemide (DEMADEX) 20 MG tablet Take 4 tablets (80 mg total) by mouth 2 (two) times daily. 240 tablet 11  . XARELTO 20 MG TABS tablet TAKE 1 TABLET BY MOUTH EVERY DAY WITH SUPPER 30 tablet 0    Prior to Admission medications   Medication Sig Start Date End Date Taking? Authorizing Provider  acetaminophen (TYLENOL) 500 MG tablet Take 500 mg by mouth 3 (three) times daily.    Yes [provider]  aspirin EC 81 MG EC tablet Take 1 tablet (81 mg total) by mouth daily. 06/17/16  Yes AReyne Dumas MD  atorvastatin (LIPITOR) 80 MG  tablet Take 1 tablet (80 mg total) by mouth daily. Patient taking differently: Take 80 mg by mouth at bedtime.  05/25/16  Yes Langeland, Dawn T, MD  collagenase (SANTYL) ointment Apply 1 application topically daily.   Yes [provider]  diltiazem (CARDIZEM CD) 240 MG 24 hr capsule Take 1 capsule (240 mg total) by mouth daily. 03/18/17  Yes RDebbe Odea MD  docusate sodium (COLACE) 100 MG capsule Take 1 capsule (100 mg total) by mouth daily. 01/26/17  Yes MBarton Dubois MD  gabapentin (NEURONTIN) 300 MG capsule Take 2 capsules (600 mg total) by mouth 2 (two) times daily. 05/25/16  Yes Langeland, Dawn T, MD  hydrocerin (EUCERIN) CREA Apply 1 application topically 3 (three) times daily. legs 05/25/16  Yes Langeland, Dawn T, MD  insulin aspart (NOVOLOG) 100 UNIT/ML injection Inject 40 Units into the skin 3 (three) times daily after meals.    Yes [provider]  Insulin Glargine (TOUJEO SOLOSTAR) 300 UNIT/ML SOPN Inject 70 Units into the skin every morning.   Yes [provider]  KLOR-CON M20 20 MEQ tablet TAKE 2 TABLETS BY MOUTH TWICE A DAY Patient taking differently: TAKE 40 MEQ BY MOUTH TWICE A DAY 02/04/17  Yes CLelon Perla MD  metolazone (ZAROXOLYN) 5 MG tablet Take 544mtwice weekly on Mondays and Fridays. Patient taking differently: Take 5 mg by mouth See admin instructions. Take 66m62mwice weekly on Mondays and Fridays. 09/06/16  Yes Bensimhon, DanShaune PascalD  metoprolol tartrate (LOPRESSOR) 50 MG tablet Take 1 tablet (50 mg total) by mouth 2 (two) times daily. 01/27/17  Yes JegTresa GarterD  Multiple Vitamin (MULTIVITAMIN WITH MINERALS) TABS Take 1 tablet by mouth daily.   Yes [provider]  oxyCODONE (ROXICODONE) 15 MG immediate release tablet Take 1 tablet (15 mg total) by mouth every 4 (four) hours as needed for pain. Hold for Sedation 03/25/17  Yes DudNewt MinionD  OXYGEN Inhale 2-3 L into the lungs continuous.   Yes [provider]    spironolactone (ALDACTONE) 25 MG tablet Take 25 mg by mouth daily.   Yes [provider]  torsemide (DEMADEX) 20 MG tablet Take 4 tablets (80 mg  total) by mouth 2 (two) times daily. 06/28/16  Yes Meng, Hao, PA  XARELTO 20 MG TABS tablet TAKE 1 TABLET BY MOUTH EVERY DAY WITH SUPPER 04/05/17  Yes Jegede, Olugbemiga E, MD    Blood pressure (!) 124/40, pulse 84, temperature 98 F (36.7 C), temperature source Oral, resp. rate 16, height _0  (1.905 m), weight (!) 360 lb (163.3 kg), SpO2 100 %. Physical Exam: General: WD/WN white male who is laying in bed in NAD HEENT: head is normocephalic, atraumatic.  Sclera are noninjected.  Pupils equal and round.  Ears and nose without any masses or lesions.  Mouth is pink and moist. Dentition poor Heart: irregularly irregular.  No obvious murmurs, gallops, or rubs noted.  Palpable left DP pulse Lungs: CTAB, no wheezes, rhonchi, or rales noted.  Respiratory effort nonlabored on  Abd: obese, soft, NT/ND, +BS, no masses, hernias, or organomegaly MS: s/p right BKA with 2cm area of incision open with single staple in place, no active drainage Skin: warm and dry with no masses, lesions, or rashes Psych: A&Ox3 with an appropriate affect. Neuro: cranial nerves grossly intact, upper extremity CSM intact bilaterally, normal speech Sacrum:    Results for orders placed or performed during the hospital encounter of 04/10/17 (from the past 48 hour(s))  CBC with Differential     Status: Abnormal   Collection Time: 04/11/17 12:01 AM  Result Value Ref Range   WBC 15.3 (H) 4.0 - 10.5 K/uL   RBC 4.13 (L) 4.22 - 5.81 MIL/uL   Hemoglobin 8.8 (L) 13.0 - 17.0 g/dL   HCT 29.0 (L) 39.0 - 52.0 %   MCV 70.2 (L) 78.0 - 100.0 fL   MCH 21.3 (L) 26.0 - 34.0 pg   MCHC 30.3 30.0 - 36.0 g/dL   RDW 19.4 (H) 11.5 - 15.5 %   Platelets 446 (H) 150 - 400 K/uL   Neutrophils Relative % 76 %   Lymphocytes Relative 13 %   Monocytes Relative 8 %   Eosinophils Relative 3 %    Basophils Relative 0 %   Neutro Abs 11.6 (H) 1.7 - 7.7 K/uL   Lymphs Abs 2.0 0.7 - 4.0 K/uL   Monocytes Absolute 1.2 (H) 0.1 - 1.0 K/uL   Eosinophils Absolute 0.5 0.0 - 0.7 K/uL   Basophils Absolute 0.0 0.0 - 0.1 K/uL   RBC Morphology POLYCHROMASIA PRESENT   Basic metabolic panel     Status: Abnormal   Collection Time: 04/11/17 12:01 AM  Result Value Ref Range   Sodium 124 (L) 135 - 145 mmol/L   Potassium 3.9 3.5 - 5.1 mmol/L   Chloride 84 (L) 101 - 111 mmol/L   CO2 25 22 - 32 mmol/L   Glucose, Bld 391 (H) 65 - 99 mg/dL   BUN 49 (H) 6 - 20 mg/dL   Creatinine, Ser 1.81 (H) 0.61 - 1.24 mg/dL   Calcium 9.0 8.9 - 10.3 mg/dL   GFR calc non Af Amer 38 (L) >60 mL/min   GFR calc Af Amer 44 (L) >60 mL/min    Comment: (NOTE) The eGFR has been calculated using the CKD EPI equation. This calculation has not been validated in all clinical situations. eGFR's persistently <60 mL/min signify possible Chronic Kidney Disease.    Anion gap 15 5 - 15  POC CBG, ED     Status: Abnormal   Collection Time: 04/11/17  1:31 AM  Result Value Ref Range   Glucose-Capillary 406 (H) 65 - 99 mg/dL  Comprehensive metabolic  panel     Status: Abnormal   Collection Time: 04/11/17  4:28 AM  Result Value Ref Range   Sodium 125 (L) 135 - 145 mmol/L   Potassium 4.3 3.5 - 5.1 mmol/L   Chloride 87 (L) 101 - 111 mmol/L   CO2 19 (L) 22 - 32 mmol/L   Glucose, Bld 435 (H) 65 - 99 mg/dL   BUN 50 (H) 6 - 20 mg/dL   Creatinine, Ser 1.93 (H) 0.61 - 1.24 mg/dL   Calcium 8.6 (L) 8.9 - 10.3 mg/dL   Total Protein 7.5 6.5 - 8.1 g/dL   Albumin 2.5 (L) 3.5 - 5.0 g/dL   AST 21 15 - 41 U/L   ALT 11 (L) 17 - 63 U/L   Alkaline Phosphatase 87 38 - 126 U/L   Total Bilirubin 1.4 (H) 0.3 - 1.2 mg/dL   GFR calc non Af Amer 35 (L) >60 mL/min   GFR calc Af Amer 40 (L) >60 mL/min    Comment: (NOTE) The eGFR has been calculated using the CKD EPI equation. This calculation has not been validated in all clinical situations. eGFR's  persistently <60 mL/min signify possible Chronic Kidney Disease.    Anion gap 19 (H) 5 - 15  CBC     Status: Abnormal   Collection Time: 04/11/17  4:28 AM  Result Value Ref Range   WBC 16.6 (H) 4.0 - 10.5 K/uL   RBC 3.52 (L) 4.22 - 5.81 MIL/uL   Hemoglobin 7.6 (L) 13.0 - 17.0 g/dL   HCT 24.8 (L) 39.0 - 52.0 %   MCV 70.5 (L) 78.0 - 100.0 fL   MCH 21.6 (L) 26.0 - 34.0 pg   MCHC 30.6 30.0 - 36.0 g/dL   RDW 19.5 (H) 11.5 - 15.5 %   Platelets 417 (H) 150 - 400 K/uL  Osmolality     Status: Abnormal   Collection Time: 04/11/17  4:28 AM  Result Value Ref Range   Osmolality 303 (H) 275 - 295 mOsm/kg  TSH     Status: None   Collection Time: 04/11/17  4:28 AM  Result Value Ref Range   TSH 0.448 0.350 - 4.500 uIU/mL    Comment: Performed by a 3rd Generation assay with a functional sensitivity of <=0.01 uIU/mL.  Cortisol     Status: None   Collection Time: 04/11/17  4:28 AM  Result Value Ref Range   Cortisol, Plasma 25.7 ug/dL    Comment: (NOTE) AM    6.7 - 22.6 ug/dL PM   <10.0       ug/dL   CBG monitoring, ED     Status: Abnormal   Collection Time: 04/11/17  7:47 AM  Result Value Ref Range   Glucose-Capillary 424 (H) 65 - 99 mg/dL   Comment 1 Notify RN    Comment 2 Document in Chart   Hemoglobin A1c     Status: Abnormal   Collection Time: 04/11/17  8:30 AM  Result Value Ref Range   Hgb A1c MFr Bld 9.6 (H) 4.8 - 5.6 %    Comment: (NOTE) Pre diabetes:          5.7%-6.4% Diabetes:              >6.4% Glycemic control for   <7.0% adults with diabetes    Mean Plasma Glucose 228.82 mg/dL  CBG monitoring, ED     Status: Abnormal   Collection Time: 04/11/17 12:06 PM  Result Value Ref Range   Glucose-Capillary 417 (H) 65 -  99 mg/dL   Comment 1 Notify RN    Comment 2 Document in Chart   Glucose, capillary     Status: Abnormal   Collection Time: 04/11/17  4:09 PM  Result Value Ref Range   Glucose-Capillary 354 (H) 65 - 99 mg/dL  Glucose, capillary     Status: Abnormal    Collection Time: 04/11/17 10:04 PM  Result Value Ref Range   Glucose-Capillary 298 (H) 65 - 99 mg/dL  CBC     Status: Abnormal   Collection Time: 04/12/17  4:14 AM  Result Value Ref Range   WBC 16.5 (H) 4.0 - 10.5 K/uL   RBC 3.18 (L) 4.22 - 5.81 MIL/uL   Hemoglobin 6.8 (LL) 13.0 - 17.0 g/dL    Comment: REPEATED TO VERIFY CRITICAL RESULT CALLED TO, READ BACK BY AND VERIFIED WITH: MARSHALL C RN AT 0509 ON 10.02.2018 BY COCHRANE S    HCT 22.2 (L) 39.0 - 52.0 %   MCV 69.8 (L) 78.0 - 100.0 fL   MCH 21.1 (L) 26.0 - 34.0 pg   MCHC 30.2 30.0 - 36.0 g/dL   RDW 18.9 (H) 11.5 - 15.5 %   Platelets 367 150 - 400 K/uL  Basic metabolic panel     Status: Abnormal   Collection Time: 04/12/17  4:14 AM  Result Value Ref Range   Sodium 126 (L) 135 - 145 mmol/L   Potassium 3.9 3.5 - 5.1 mmol/L   Chloride 84 (L) 101 - 111 mmol/L   CO2 28 22 - 32 mmol/L   Glucose, Bld 378 (H) 65 - 99 mg/dL   BUN 55 (H) 6 - 20 mg/dL   Creatinine, Ser 2.06 (H) 0.61 - 1.24 mg/dL   Calcium 8.6 (L) 8.9 - 10.3 mg/dL   GFR calc non Af Amer 32 (L) >60 mL/min   GFR calc Af Amer 37 (L) >60 mL/min    Comment: (NOTE) The eGFR has been calculated using the CKD EPI equation. This calculation has not been validated in all clinical situations. eGFR's persistently <60 mL/min signify possible Chronic Kidney Disease.    Anion gap 14 5 - 15  Osmolality, urine     Status: Abnormal   Collection Time: 04/12/17  7:03 AM  Result Value Ref Range   Osmolality, Ur 293 (L) 300 - 900 mOsm/kg    Comment: REPEATED TO VERIFY  Sodium, urine, random     Status: None   Collection Time: 04/12/17  7:03 AM  Result Value Ref Range   Sodium, Ur 36 mmol/L  Type and screen [place]     Status: None (Preliminary result)   Collection Time: 04/12/17  8:00 AM  Result Value Ref Range   ABO/RH(D) A NEG    Antibody Screen NEG    Sample Expiration 04/15/2017    Unit Number [id number]    Blood Component Type RED CELLS,LR    Unit  division 00    Status of Unit ISSUED    Transfusion Status OK TO TRANSFUSE    Crossmatch Result Compatible    Unit Number [id number]    Blood Component Type RBC LR PHER2    Unit division 00    Status of Unit ALLOCATED    Transfusion Status OK TO TRANSFUSE    Crossmatch Result Compatible   Glucose, capillary     Status: Abnormal   Collection Time: 04/12/17  8:16 AM  Result Value Ref Range   Glucose-Capillary 436 (H) 65 - 99 mg/dL  Prepare RBC  Status: None   Collection Time: 04/12/17  9:53 AM  Result Value Ref Range   Order Confirmation ORDER PROCESSED BY BLOOD BANK   Glucose, capillary     Status: Abnormal   Collection Time: 04/12/17 12:57 PM  Result Value Ref Range   Glucose-Capillary 394 (H) 65 - 99 mg/dL   No results found.  Anti-infectives    None       Assessment/Plan HTN HLD DM S/p R BKA 03/23/17 CKD-3 CAD Afib - last does eliquis 9/30 Chronic pain on chronic narcotics  Sacral ulcer - chronic sacral ulcer present for several weeks. Active bleeding has stopped. Last dose eliquis 9/30. Will order supplies and plan for bedside debridement tomorrow. Please continue to hold eliquis.  ID - none VTE - SCDs, hold chemical DVT prophylaxis for anemia and procedure FEN - HH/carb modified diet Foley - condem cath Follow up - TBD   Wellington Hampshire, [facility] 04/12/2017, 3:12 PM Pager: [phone] Consults: [phone] Mon-Fri 7:00 am-4:30 pm Sat-Sun 7:00 am-11:30 am

## 2017-04-12 NOTE — Clinical Social Work Note (Signed)
Clinical Social Work Assessment  Patient Details  Name: [name] MRN: [id number] Date of Birth: [birth year]  Date of referral:  04/12/17               Reason for consult:  Facility Placement (from [facility])                Permission sought to share information with:  Facility Medical sales representative, Family Supports Permission granted to share information::  No (patient asleep, could not be woken)  Name::     [name]  Agency::  [facility]  Relationship::  sister  Contact Information:  [phone]  Housing/Transportation Living arrangements for the past 2 months:  Skilled Nursing Facility, Single Family Home Source of Information:  Facility Patient Interpreter Needed:  None Criminal Activity/Legal Involvement Pertinent to Current Situation/Hospitalization:  No - Comment as needed Significant Relationships:  Siblings Lives with:  Facility Resident Do you feel safe going back to the place where you live?  Yes Need for family participation in patient care:  No (Coment)  Care giving concerns: Patient from [facility].    Social Worker assessment / plan: CSW attempted to meet with patient at bedside. Patient could not be woken. CSW confirmed with [facility] patient is from there; was admitted there 03/30/17. CSW spoke with patient's sister, [name], via phone. Sister with concern about patient's care at SNF; reports patient lost significant amount of blood at SNF and she called EMS to bring him to hospital. Sister with questions about long term care. CSW to follow up with sister in the morning and determine discharge plan - likely back to [place].  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  Patient/Family's  Understanding of and Emotional Response to Diagnosis, Current Treatment, and Prognosis: Sister with understanding of patient's condition, with concern for patient's treatment for significant bleed.   Emotional Assessment Appearance:  Appears stated age Attitude/Demeanor/Rapport:  Unable to Assess Affect (typically observed):  Unable to Assess Orientation:  Oriented to Self, Oriented to Place, Oriented to Situation, Oriented to  Time Alcohol / Substance use:  Not Applicable Psych involvement (Current and /or in the community):  No (Comment)  Discharge Needs  Concerns to be addressed:  Discharge Planning Concerns Readmission within the last 30 days:  Yes Current discharge risk:  Physical Impairment, Dependent with Mobility Barriers to Discharge:  Continued Medical Work up   Abigail Butts, LCSW 04/12/2017, 4:49 PM

## 2017-04-12 NOTE — Progress Notes (Signed)
Patient blood sugar this morning is 436. MD notified. Verbal orders given and followed. Will continue to monitor.

## 2017-04-13 LAB — TYPE AND SCREEN
ABO/RH(D): A NEG
Antibody Screen: NEGATIVE
UNIT DIVISION: 0
Unit division: 0

## 2017-04-13 LAB — CBC
HEMATOCRIT: 24.9 % — AB (ref 39.0–52.0)
Hemoglobin: 8 g/dL — ABNORMAL LOW (ref 13.0–17.0)
MCH: 23.7 pg — AB (ref 26.0–34.0)
MCHC: 32.1 g/dL (ref 30.0–36.0)
MCV: 73.7 fL — AB (ref 78.0–100.0)
PLATELETS: 313 10*3/uL (ref 150–400)
RBC: 3.38 MIL/uL — ABNORMAL LOW (ref 4.22–5.81)
RDW: 20.1 % — AB (ref 11.5–15.5)
WBC: 16.1 10*3/uL — ABNORMAL HIGH (ref 4.0–10.5)

## 2017-04-13 LAB — BASIC METABOLIC PANEL
ANION GAP: 13 (ref 5–15)
BUN: 52 mg/dL — AB (ref 6–20)
CALCIUM: 8.8 mg/dL — AB (ref 8.9–10.3)
CO2: 27 mmol/L (ref 22–32)
CREATININE: 1.56 mg/dL — AB (ref 0.61–1.24)
Chloride: 88 mmol/L — ABNORMAL LOW (ref 101–111)
GFR calc Af Amer: 52 mL/min — ABNORMAL LOW (ref >60)
GFR calc non Af Amer: 45 mL/min — ABNORMAL LOW (ref >60)
GLUCOSE: 187 mg/dL — AB (ref 65–99)
Potassium: 4 mmol/L (ref 3.5–5.1)
Sodium: 128 mmol/L — ABNORMAL LOW (ref 135–145)

## 2017-04-13 LAB — GLUCOSE, CAPILLARY
Glucose-Capillary: 272 mg/dL — ABNORMAL HIGH (ref 65–99)
Glucose-Capillary: 283 mg/dL — ABNORMAL HIGH (ref 65–99)
Glucose-Capillary: 194 mg/dL — ABNORMAL HIGH (ref 65–99)

## 2017-04-13 LAB — BPAM RBC
BLOOD PRODUCT EXPIRATION DATE: 201810072359
Blood Product Expiration Date: 201810062359
ISSUE DATE / TIME: 201810021213
ISSUE DATE / TIME: 201810021659
UNIT TYPE AND RH: 600
Unit Type and Rh: 600

## 2017-04-13 LAB — MRSA PCR SCREENING: MRSA by PCR: POSITIVE — AB

## 2017-04-13 MED ORDER — CHLORHEXIDINE GLUCONATE CLOTH 2 % EX PADS
6.0000 | MEDICATED_PAD | Freq: Every day | CUTANEOUS | Status: AC
  Administered 2017-04-14 – 2017-04-18 (×4): 6 via TOPICAL

## 2017-04-13 MED ORDER — MUPIROCIN 2 % EX OINT
1.0000 "application " | TOPICAL_OINTMENT | Freq: Two times a day (BID) | CUTANEOUS | Status: AC
  Administered 2017-04-13 – 2017-04-17 (×10): 1 via NASAL
  Filled 2017-04-13 (×6): qty 22

## 2017-04-13 MED ORDER — PRO-STAT SUGAR FREE PO LIQD
30.0000 mL | Freq: Two times a day (BID) | ORAL | Status: DC
  Administered 2017-04-13 – 2017-04-20 (×12): 30 mL via ORAL
  Filled 2017-04-13 (×14): qty 30

## 2017-04-13 NOTE — Consult Note (Signed)
WOC consulted for unstageable wound, please see consultation notes seen by [facility]  04/12/17.  Collaboration with Dr. Benjamine Mola and new orders for surgical evaluation.  WOC will follow along with CCS and primary care team.    WOC Nurse team will follow along with you for weekly wound assessments.  Please notify me of any acute changes in the wounds or any new areas of concerns [name] [facility] MSN, RN,CWOCN, CNS, CWON-AP [phone]

## 2017-04-13 NOTE — Progress Notes (Signed)
Patient ID: [name], male   DOB: Nov 13, 1950, 66 y.o.   MRN: [id number]  Incision and Drainage Procedure Note  Pre-operative Diagnosis: Sacral decubitus ulcer  Post-operative Diagnosis: same  Indications: Wound care  Procedure Details  The procedure, risks and complications have been discussed in detail (including, but not limited to pain, infection, bleeding) with the patient, and the patient wishes to proceed with the procedure.  The skin was sterilely prepped over the affected area in the usual fashion. Using an #11 blade necrotic tissue was debrided from the sacral wound. There was trace amount of bleeding. Wound measures about 10cm x 10cm x 0.1cm. Saline dampened kerlex were applied to the wound and covered with an ABD pad. The patient was observed until stable.  EBL: <5 cc's  Condition: Tolerated procedure ok, he is Stable   Complications: pain.     Patient did have pain with some of the procedure so not all of the necrotic tissue was able to be removed. Will order hydrotherapy and BID wet to dry dressing changes. May need further debridement.  [name] A Iretha Kirley

## 2017-04-13 NOTE — Progress Notes (Signed)
Dressings changed per order. No complications. Will continue to monitor.

## 2017-04-13 NOTE — Progress Notes (Signed)
B/P is 102/46 and HR is 70. MD notified. MD stated to hold Cardizem. Orders followed. Will continue to monitor.

## 2017-04-13 NOTE — Progress Notes (Signed)
Subjective No acute events. Doing well. No complaints  Objective: Vital signs in last 24 hours: Temp:  [97.7 F (36.5 C)-98.6 F (37 C)] 98.6 F (37 C) (10/03 0531) Pulse Rate:  [69-86] 69 (10/03 0531) Resp:  [16-20] 18 (10/03 0531) BP: (104-131)/(40-66) 116/66 (10/03 0531) SpO2:  [96 %-100 %] 100 % (10/03 0531) Last BM Date: 04/13/17  Intake/Output from previous day: 10/02 0701 - 10/03 0700 In: 1632 [P.O.:1242; Blood:390] Out: 1700 [Urine:1700] Intake/Output this shift: Total I/O In: 240 [P.O.:240] Out: -   Gen: NAD, comfortable CV: RRR Pulm: Normal work of breathing Abd: Soft, NT Integumentary: Sacral decub dressed, dry Ext: SCDs in place  Lab Results: CBC   Recent Labs  04/12/17 0414 04/13/17 0216  WBC 16.5* 16.1*  HGB 6.8* 8.0*  HCT 22.2* 24.9*  PLT 367 313   BMET  Recent Labs  04/12/17 0414 04/13/17 0216  NA 126* 128*  K 3.9 4.0  CL 84* 88*  CO2 28 27  GLUCOSE 378* 187*  BUN 55* 52*  CREATININE 2.06* 1.56*  CALCIUM 8.6* 8.8*    Assessment/Plan: HTN HLD DM S/p R BKA 03/23/17 CKD-3 CAD Afib - last does eliquis 9/30 Chronic pain on chronic narcotics  -Will return later for attempted bedside debridement today -Hold eliquis for time being   LOS: 1 day   Andria Meuse, MD [facility]

## 2017-04-13 NOTE — Progress Notes (Signed)
PROGRESS NOTE    [name]  KDT:[id number] DOB: 10-17-1950 DOA: 04/10/2017 PCP: [name], MD   Outpatient Specialists:     Brief Narrative:  [name]  is a 66 y.o. male, w hypertension, hyperlipidemia, Dm2, w R BKA,  CKD stage3, Pafib, CAD apparently presented due to bleeding from large sacral decubitus ulcer.  Pt sent to ED for evaluation.   In ED,  Hgb 8.8, Na 124, Glucose 391, Bun 49/Creat 1.81, pt will be admitted for Acute on CRF, and hyponatremia and bleeding from sacral ulcer   Assessment & Plan:   Principal Problem:   Hyperglycemia Active Problems:   Obesity, Class III, BMI 40-49.9 (morbid obesity) (HCC)   Atrial fibrillation   Below knee amputation status, right (HCC)   Hyponatremia   ARF (acute renal failure) (HCC)   Uncontrolled DM with Hyperglycemia SSI -resume home doses of medications and monitor -will need further titration as tolerated -was at one time on U500 and was supposed to get insulin gtt  ARF on CKD stage III -monitor (Cr with wide range in chart 1.5-2.2) -diuretics resumed -daily labs  Hyponatremia  -TSH ok -improving  Bleeding from sacral ulcer, cauterized by ED Wound care consult -have also consulted general surgery for further debridement -depending on wound care needs, ? If LTAC appropriate: multiple medical issues  Anemia From blood loss anemia -s/p transfusion  Pafib, on eliquis -Holding blood thinner due to AKI and bleeding -resume after debridement?  Chronic pain -increase oxy IR -follows with pain management as an outpatient  Morbid obesity Body mass index is 45 kg/m.  DVT prophylaxis:  SCD's  Code Status: DNR   Family Communication: None at bedside  Disposition Plan:  Await debridement and plan   Consultants:   WOC  General surgery    Subjective: Still with pain but improved  Objective: Vitals:   04/12/17 1653 04/12/17 1730 04/12/17 2045 04/13/17 0531  BP: (!)  116/54 (!) 106/51 (!) 120/58 116/66  Pulse: 71 69 72 69  Resp: 18 16 18 18   Temp: 97.8 F (36.6 C) 97.9 F (36.6 C) 98.1 F (36.7 C) 98.6 F (37 C)  TempSrc: Oral Oral Oral Oral  SpO2: 100% 100% 100% 100%  Weight:      Height:        Intake/Output Summary (Last 24 hours) at 04/13/17 1049 Last data filed at 04/13/17 0845  Gross per 24 hour  Intake             1290 ml  Output             1100 ml  Net              190 ml   Filed Weights   04/10/17 2305  Weight: (!) 163.3 kg (360 lb)    Examination:  General exam: in bariatric bed Respiratory system: diminished, no wheezing Cardiovascular system: irr Gastrointestinal system: obese, +BS Central nervous system: alert Extremities: right leg wrapped Psychiatry: flat affect    Data Reviewed: I have personally reviewed following labs and imaging studies  CBC:  Recent Labs Lab 04/11/17 0001 04/11/17 0428 04/12/17 0414 04/13/17 0216  WBC 15.3* 16.6* 16.5* 16.1*  NEUTROABS 11.6*  --   --   --   HGB 8.8* 7.6* 6.8* 8.0*  HCT 29.0* 24.8* 22.2* 24.9*  MCV 70.2* 70.5* 69.8* 73.7*  PLT 446* 417* 367 313   Basic Metabolic Panel:  Recent Labs Lab 04/11/17 0001 04/11/17 0428 04/12/17 0414 04/13/17 0216  NA 124* 125* 126* 128*  K 3.9 4.3 3.9 4.0  CL 84* 87* 84* 88*  CO2 25 19* 28 27  GLUCOSE 391* 435* 378* 187*  BUN 49* 50* 55* 52*  CREATININE 1.81* 1.93* 2.06* 1.56*  CALCIUM 9.0 8.6* 8.6* 8.8*   GFR: Estimated Creatinine Clearance: 77.5 mL/min (A) (by C-G formula based on SCr of 1.56 mg/dL (H)). Liver Function Tests:  Recent Labs Lab 04/11/17 0428  AST 21  ALT 11*  ALKPHOS 87  BILITOT 1.4*  PROT 7.5  ALBUMIN 2.5*   No results for input(s): LIPASE, AMYLASE in the last 168 hours. No results for input(s): AMMONIA in the last 168 hours. Coagulation Profile: No results for input(s): INR, PROTIME in the last 168 hours. Cardiac Enzymes: No results for input(s): CKTOTAL, CKMB, CKMBINDEX, TROPONINI in the  last 168 hours. BNP (last 3 results) No results for input(s): PROBNP in the last 8760 hours. HbA1C:  Recent Labs  04/11/17 0830  HGBA1C 9.6*   CBG:  Recent Labs Lab 04/12/17 0816 04/12/17 1257 04/12/17 1705 04/12/17 2049 04/13/17 0818  GLUCAP 436* 394* 318* 189* 272*   Lipid Profile: No results for input(s): CHOL, HDL, LDLCALC, TRIG, CHOLHDL, LDLDIRECT in the last 72 hours. Thyroid Function Tests:  Recent Labs  04/11/17 0428  TSH 0.448   Anemia Panel: No results for input(s): VITAMINB12, FOLATE, FERRITIN, TIBC, IRON, RETICCTPCT in the last 72 hours. Urine analysis:    Component Value Date/Time   COLORURINE YELLOW 03/15/2017 1253   APPEARANCEUR HAZY (A) 03/15/2017 1253   LABSPEC 1.017 03/15/2017 1253   PHURINE 5.0 03/15/2017 1253   GLUCOSEU NEGATIVE 03/15/2017 1253   HGBUR NEGATIVE 03/15/2017 1253   BILIRUBINUR NEGATIVE 03/15/2017 1253   KETONESUR NEGATIVE 03/15/2017 1253   PROTEINUR NEGATIVE 03/15/2017 1253   UROBILINOGEN 1.0 11/27/2014 0755   NITRITE NEGATIVE 03/15/2017 1253   LEUKOCYTESUR NEGATIVE 03/15/2017 1253     )No results found for this or any previous visit (from the past 240 hour(s)).    Anti-infectives    None       Radiology Studies: No results found.      Scheduled Meds: . atorvastatin  80 mg Oral QHS  . diltiazem  240 mg Oral Daily  . docusate sodium  100 mg Oral Daily  . gabapentin  600 mg Oral BID  . insulin aspart  0-20 Units Subcutaneous TID WC  . insulin aspart  0-5 Units Subcutaneous QHS  . insulin aspart  20 Units Subcutaneous TID WC  . insulin glargine  80 Units Subcutaneous Daily  . lidocaine (PF)  20 mL Intradermal Once  . metoprolol tartrate  50 mg Oral BID  . multivitamin with minerals  1 tablet Oral Daily  . potassium chloride SA  40 mEq Oral BID  . spironolactone  25 mg Oral Daily  . torsemide  80 mg Oral BID   Continuous Infusions:    LOS: 1 day    Time spent: 25 min    JESSICA U VANN, DO [facility] Pager [phone]  If 7PM-7AM, please contact night-coverage [email or web address] Password [email or web address] 04/13/2017, 10:49 AM

## 2017-04-13 NOTE — Progress Notes (Addendum)
Initial Nutrition Assessment  DOCUMENTATION CODES:   Morbid obesity  INTERVENTION:   -30 ml Prostat BID, each supplement provides 100 kcals and 15 grams protein -MVI daily  NUTRITION DIAGNOSIS:   Increased nutrient needs related to wound healing as evidenced by estimated needs.  GOAL:   Patient will meet greater than or equal to 90% of their needs  MONITOR:   PO intake, Supplement acceptance, Labs, Weight trends, Skin, I & O's  REASON FOR ASSESSMENT:   Consult Wound healing  ASSESSMENT:   [name]  is a 66 y.o. male, w hypertension, hyperlipidemia, Dm2, w R BKA,  CKD stage3, Pafib, CAD apparently presented due to bleeding from large sacral decubitus ulcer  Pt admitted with hyperglycemia and large sacral decubitus ulcer.   Pt reports poor appetite for a few months PTA, estimates he consumed at least 50% of meals served. He reports he "did okay" with breakfast. Meal completion records indicate 100% meal completion. Pt complaining about diet restriction and not receiving certain items on meal trays- RD briefly educated pt on rationale for prescribed diet order.   Pt estimates that he lost 30# after undergoing rt BKA. Reviewed wt hx- pt underwent rt BKA in 03/2017. Wt has been stable over the past year.   Nutrition-Focused physical exam completed. Findings are no fat depletion, no muscle depletion, and mild edema.   Pt with poorly controlled DM. Last Hgb A1c: 9.6 (04/11/17). PTA DM medications are 70 units toujeo daily and 40 units novolog TID. Did not attempt further education at this time, due to pt's flat affect.    Labs reviewed: Na: 128, CBGS: 189-394 (inpatient orders for glycemic control are 20 units insulin aspart TID, 0-20 units insulin aspart TID, 0-5 units insulin aspart q HS, 70 units insulin glargine daily).   Diet Order:  Diet heart healthy/carb modified Room service appropriate? Yes; Fluid consistency: Thin  Skin:  Wound (see comment) (UN sacrum, open  wounds to rt stump x 2)  Last BM:  04/13/17  Height:   Ht Readings from Last 1 Encounters:  04/10/17 6\' 3"  (1.905 m)    Weight:   Wt Readings from Last 1 Encounters:  04/10/17 (!) 360 lb (163.3 kg)    Ideal Body Weight:  83.3 kg  BMI:  Body mass index is 45 kg/m.  Estimated Nutritional Needs:   Kcal:  2200-2400  Protein:  130-145 grams  Fluid:  >2.2 L  EDUCATION NEEDS:   Education needs addressed  [name] A. 04/12/17, RD, LDN, CDE Pager: [phone] After hours Pager: [phone]

## 2017-04-14 ENCOUNTER — Ambulatory Visit (INDEPENDENT_AMBULATORY_CARE_PROVIDER_SITE_OTHER): Payer: Medicare Other | Admitting: Family

## 2017-04-14 LAB — CBC
HEMATOCRIT: 25.9 % — AB (ref 39.0–52.0)
Hemoglobin: 8 g/dL — ABNORMAL LOW (ref 13.0–17.0)
MCH: 23.1 pg — ABNORMAL LOW (ref 26.0–34.0)
MCHC: 30.9 g/dL (ref 30.0–36.0)
MCV: 74.6 fL — AB (ref 78.0–100.0)
Platelets: 308 10*3/uL (ref 150–400)
RBC: 3.47 MIL/uL — ABNORMAL LOW (ref 4.22–5.81)
RDW: 20.2 % — AB (ref 11.5–15.5)
WBC: 16.2 10*3/uL — ABNORMAL HIGH (ref 4.0–10.5)

## 2017-04-14 LAB — BASIC METABOLIC PANEL
Anion gap: 13 (ref 5–15)
BUN: 46 mg/dL — ABNORMAL HIGH (ref 6–20)
CALCIUM: 8.4 mg/dL — AB (ref 8.9–10.3)
CO2: 26 mmol/L (ref 22–32)
CREATININE: 1.45 mg/dL — AB (ref 0.61–1.24)
Chloride: 88 mmol/L — ABNORMAL LOW (ref 101–111)
GFR calc non Af Amer: 49 mL/min — ABNORMAL LOW (ref >60)
GFR, EST AFRICAN AMERICAN: 57 mL/min — AB (ref >60)
GLUCOSE: 272 mg/dL — AB (ref 65–99)
Potassium: 3.7 mmol/L (ref 3.5–5.1)
Sodium: 127 mmol/L — ABNORMAL LOW (ref 135–145)

## 2017-04-14 LAB — GLUCOSE, CAPILLARY
Glucose-Capillary: 343 mg/dL — ABNORMAL HIGH (ref 65–99)
Glucose-Capillary: 259 mg/dL — ABNORMAL HIGH (ref 65–99)
Glucose-Capillary: 182 mg/dL — ABNORMAL HIGH (ref 65–99)

## 2017-04-14 MED ORDER — INSULIN GLARGINE 100 UNIT/ML ~~LOC~~ SOLN
90.0000 [IU] | Freq: Every day | SUBCUTANEOUS | Status: DC
  Administered 2017-04-15 – 2017-04-17 (×3): 90 [IU] via SUBCUTANEOUS
  Filled 2017-04-14 (×4): qty 0.9

## 2017-04-14 MED ORDER — HYDROMORPHONE HCL 1 MG/ML IJ SOLN: 1.0000 mg | INTRAMUSCULAR | Status: DC | PRN

## 2017-04-14 MED ORDER — HYDROMORPHONE HCL 1 MG/ML IJ SOLN
0.5000 mg | Freq: Once | INTRAMUSCULAR | Status: AC
  Administered 2017-04-14: 0.5 mg via INTRAVENOUS
  Filled 2017-04-14: qty 0.5

## 2017-04-14 MED ORDER — LIDOCAINE 4 % EX CREA
TOPICAL_CREAM | Freq: Once | CUTANEOUS | Status: DC
  Filled 2017-04-14 (×2): qty 5

## 2017-04-14 NOTE — Progress Notes (Signed)
PROGRESS NOTE    [name]  STM:[id number] DOB: [birth year] DOA: 04/10/2017 PCP: [name], MD   Outpatient Specialists:     Brief Narrative:  [name]  is a 66 y.o. male, w hypertension, hyperlipidemia, Dm2, w R BKA,  CKD stage3, Pafib, CAD apparently presented due to bleeding from large sacral decubitus ulcer.  Pt sent to ED for evaluation.   In ED,  Hgb 8.8, Na 124, Glucose 391, Bun 49/Creat 1.81, pt will be admitted for Acute on CRF, and hyponatremia and bleeding from sacral ulcer   Assessment & Plan:   Principal Problem:   Hyperglycemia Active Problems:   Obesity, Class III, BMI 40-49.9 (morbid obesity) (HCC)   Atrial fibrillation   Below knee amputation status, right (HCC)   Hyponatremia   ARF (acute renal failure) (HCC)   Uncontrolled DM with Hyperglycemia -SSI -resume home doses of medications -will need further titration as tolerated -was at one time on U500 and was supposed to get insulin gtt- follows with Dr. Lucianne Muss  ARF on CKD stage III -monitor (Cr with wide range in chart 1.5-2.2) -diuretics resumed -daily labs  Hyponatremia  -TSH ok -follow  Bleeding from sacral ulcer, cauterized by ED Wound care consult -have also consulted general surgery for further debridement -depending on wound care needs, ? If LTAC appropriate: multiple medical issues -PRN dilaudid before debridement  Leukocytosis -? Reactive vs from decubitus -no fever  Anemia From blood loss anemia -s/p transfusion-- Hg stable  Pafib, on eliquis -Holding blood thinner due to AKI and bleeding -resume after debridement?  Chronic pain -increase oxy IR -follows with pain management as an outpatient  Morbid obesity Body mass index is 45 kg/m.  DVT prophylaxis:  SCD's  Code Status: DNR   Family Communication: None at bedside  Disposition Plan:  Await debridement   Consultants:   WOC  General surgery    Subjective: C/o pain when  getting debrided   Objective: Vitals:   04/13/17 2203 04/14/17 0606 04/14/17 1000 04/14/17 1023  BP: (!) 96/34 115/60 (!) 123/53   Pulse: 76 64  91  Resp: 16 16 18    Temp: 97.8 F (36.6 C) 98.2 F (36.8 C) 98 F (36.7 C)   TempSrc: Oral Oral Oral   SpO2: 99% 97% 100%   Weight:      Height:        Intake/Output Summary (Last 24 hours) at 04/14/17 1259 Last data filed at 04/14/17 0900  Gross per 24 hour  Intake              480 ml  Output             3650 ml  Net            -3170 ml   Filed Weights   04/10/17 2305  Weight: (!) 163.3 kg (360 lb)    Examination:  General exam: older than stated age, uncomfortable appearing Respiratory system: diminished, no wheezing Cardiovascular system: irr Gastrointestinal system: +BS, obese Central nervous system: alert Extremities: right BKA Psychiatry: flat affect    Data Reviewed: I have personally reviewed following labs and imaging studies  CBC:  Recent Labs Lab 04/11/17 0001 04/11/17 0428 04/12/17 0414 04/13/17 0216 04/14/17 0243  WBC 15.3* 16.6* 16.5* 16.1* 16.2*  NEUTROABS 11.6*  --   --   --   --   HGB 8.8* 7.6* 6.8* 8.0* 8.0*  HCT 29.0* 24.8* 22.2* 24.9* 25.9*  MCV 70.2* 70.5* 69.8* 73.7* 74.6*  PLT 446* 417* 367 313 308   Basic Metabolic Panel:  Recent Labs Lab 04/11/17 0001 04/11/17 0428 04/12/17 0414 04/13/17 0216 04/14/17 0243  NA 124* 125* 126* 128* 127*  K 3.9 4.3 3.9 4.0 3.7  CL 84* 87* 84* 88* 88*  CO2 25 19* 28 27 26   GLUCOSE 391* 435* 378* 187* 272*  BUN 49* 50* 55* 52* 46*  CREATININE 1.81* 1.93* 2.06* 1.56* 1.45*  CALCIUM 9.0 8.6* 8.6* 8.8* 8.4*   GFR: Estimated Creatinine Clearance: 83.3 mL/min (A) (by C-G formula based on SCr of 1.45 mg/dL (H)). Liver Function Tests:  Recent Labs Lab 04/11/17 0428  AST 21  ALT 11*  ALKPHOS 87  BILITOT 1.4*  PROT 7.5  ALBUMIN 2.5*   No results for input(s): LIPASE, AMYLASE in the last 168 hours. No results for input(s): AMMONIA in the  last 168 hours. Coagulation Profile: No results for input(s): INR, PROTIME in the last 168 hours. Cardiac Enzymes: No results for input(s): CKTOTAL, CKMB, CKMBINDEX, TROPONINI in the last 168 hours. BNP (last 3 results) No results for input(s): PROBNP in the last 8760 hours. HbA1C: No results for input(s): HGBA1C in the last 72 hours. CBG:  Recent Labs Lab 04/13/17 0818 04/13/17 1216 04/13/17 1721 04/14/17 0842 04/14/17 1240  GLUCAP 272* 283* 194* 343* 259*   Lipid Profile: No results for input(s): CHOL, HDL, LDLCALC, TRIG, CHOLHDL, LDLDIRECT in the last 72 hours. Thyroid Function Tests: No results for input(s): TSH, T4TOTAL, FREET4, T3FREE, THYROIDAB in the last 72 hours. Anemia Panel: No results for input(s): VITAMINB12, FOLATE, FERRITIN, TIBC, IRON, RETICCTPCT in the last 72 hours. Urine analysis:    Component Value Date/Time   COLORURINE YELLOW 03/15/2017 1253   APPEARANCEUR HAZY (A) 03/15/2017 1253   LABSPEC 1.017 03/15/2017 1253   PHURINE 5.0 03/15/2017 1253   GLUCOSEU NEGATIVE 03/15/2017 1253   HGBUR NEGATIVE 03/15/2017 1253   BILIRUBINUR NEGATIVE 03/15/2017 1253   KETONESUR NEGATIVE 03/15/2017 1253   PROTEINUR NEGATIVE 03/15/2017 1253   UROBILINOGEN 1.0 11/27/2014 0755   NITRITE NEGATIVE 03/15/2017 1253   LEUKOCYTESUR NEGATIVE 03/15/2017 1253     ) Recent Results (from the past 240 hour(s))  MRSA PCR Screening     Status: Abnormal   Collection Time: 04/13/17 11:42 AM  Result Value Ref Range Status   MRSA by PCR POSITIVE (A) NEGATIVE Final    Comment:        The GeneXpert MRSA Assay (FDA approved for NASAL specimens only), is one component of a comprehensive MRSA colonization surveillance program. It is not intended to diagnose MRSA infection nor to guide or monitor treatment for MRSA infections. RESULT CALLED TO, READ BACK BY AND VERIFIED WITH: A. Mabe RN 13:10 04/13/17 ([email or web address])       Anti-infectives    None       Radiology Studies: No  results found.      Scheduled Meds: . atorvastatin  80 mg Oral QHS  . Chlorhexidine Gluconate Cloth  6 each Topical Q0600  . diltiazem  240 mg Oral Daily  . docusate sodium  100 mg Oral Daily  . feeding supplement (PRO-STAT SUGAR FREE 64)  30 mL Oral BID  . gabapentin  600 mg Oral BID  . insulin aspart  0-20 Units Subcutaneous TID WC  . insulin aspart  0-5 Units Subcutaneous QHS  . insulin aspart  20 Units Subcutaneous TID WC  . insulin glargine  80 Units Subcutaneous Daily  . metoprolol tartrate  50 mg Oral BID  .  multivitamin with minerals  1 tablet Oral Daily  . mupirocin ointment  1 application Nasal BID  . potassium chloride SA  40 mEq Oral BID  . spironolactone  25 mg Oral Daily  . torsemide  80 mg Oral BID   Continuous Infusions:    LOS: 2 days    Time spent: 25 min    JESSICA U VANN, DO [name] Pager [phone]  If 7PM-7AM, please contact night-coverage [email or web address] Password [email or web address] 04/14/2017, 12:59 PM

## 2017-04-14 NOTE — Progress Notes (Signed)
Physical Therapy Wound Treatment Patient Details  Name: CARON TARDIF MRN: [id number] Date of Birth: [birth year]  Today's Date: 04/14/2017 Time: 1100-1135 Time Calculation (min): 35 min  Subjective  Subjective: I'm not doing this anymore if it causes this much pain.  Pain Score:  up to 10/10  Wound Assessment  Pressure Injury 03/23/17 Unstageable - Full thickness tissue loss in which the base of the ulcer is covered by slough (yellow, tan, gray, green or brown) and/or eschar (tan, brown or black) in the wound bed. WOC updated 04/13/17 unstageable (Active)  Dressing Type ABD;Barrier Film (skin prep);Gauze (Comment);Moist to dry 04/14/2017  2:38 PM  Dressing Changed;Intact 04/14/2017  2:38 PM  Dressing Change Frequency Daily 04/14/2017  2:38 PM  State of Healing Eschar 04/14/2017  2:38 PM  Site / Wound Assessment Yellow;Black;Brown;Bleeding 04/14/2017  2:38 PM  % Wound base Red or Granulating 0% 04/14/2017  2:38 PM  % Wound base Yellow/Fibrinous Exudate 100% 04/14/2017  2:38 PM  % Wound base Black/Eschar 0% 04/14/2017  2:38 PM  % Wound base Other/Granulation Tissue (Comment) 5% 04/14/2017  2:38 PM  Peri-wound Assessment Erythema (blanchable) 04/14/2017  2:38 PM  Wound Length (cm) 12.5 cm 04/14/2017  2:38 PM  Wound Width (cm) 12.5 cm 04/14/2017  2:38 PM  Wound Depth (cm) 2 cm 04/14/2017  2:38 PM  Wound Surface Area (cm^2) 156.25 cm^2 04/14/2017  2:38 PM  Wound Volume (cm^3) 312.5 cm^3 04/14/2017  2:38 PM  Margins Unattached edges (unapproximated) 04/14/2017  2:38 PM  Drainage Amount Moderate 04/14/2017  2:38 PM  Drainage Description Green;Odor 04/14/2017  2:38 PM  Treatment Cleansed;Debridement (Selective);Hydrotherapy (Pulse lavage);Packing (Saline gauze) 04/14/2017  2:38 PM      Hydrotherapy Pulsed lavage therapy - wound location: sacral Pulsed Lavage with Suction (psi): 8 psi (to 12) Pulsed Lavage with Suction - Normal Saline Used: 1000 mL Pulsed Lavage Tip: Tip with splash shield Selective  Debridement Selective Debridement - Location: sacral Selective Debridement - Tools Used: Forceps;Scalpel;Scissors Selective Debridement - Tissue Removed: slough, eschar and necrosing adipose   Wound Assessment and Plan  Wound Therapy - Assess/Plan/Recommendations Wound Therapy - Clinical Statement: pt could benefit from hydro for PLS to cleanse soften and decrease bacterial load, to prep for selective debridement.  Pt has been unable to tolerate the pain of debridement and have asked MD for break through pain meds and topical lidocaine. Wound Therapy - Functional Problem List: pt unable to spend any significant time in sitting due to this wound and  become progressively less mobile. Factors Delaying/Impairing Wound Healing: Diabetes Mellitus;Immobility;Multiple medical problems Hydrotherapy Plan: Debridement;Dressing change;Patient/family education;Pulsatile lavage with suction Wound Therapy - Frequency: 6X / week Wound Therapy - Current Recommendations: PT;WOC nurse Wound Therapy - Follow Up Recommendations: Skilled nursing facility Wound Plan: see above  Wound Therapy Goals- Improve the function of patient's integumentary system by progressing the wound(s) through the phases of wound healing (inflammation - proliferation - remodeling) by: Decrease Necrotic Tissue to: 50 Decrease Necrotic Tissue - Progress: Goal set today Increase Granulation Tissue to: 50% Increase Granulation Tissue - Progress: Goal set today Improve Drainage Characteristics: Min;Serous Improve Drainage Characteristics - Progress: Goal set today Goals/treatment plan/discharge plan were made with and agreed upon by patient/family: Yes Time For Goal Achievement: 7 days Wound Therapy - Potential for Goals: Good  Goals will be updated until maximal potential achieved or discharge criteria met.  Discharge criteria: when goals achieved, discharge from hospital, MD decision/surgical intervention, no progress towards goals,  refusal/missing three consecutive treatments without  notification or medical reason.  GP     Tessie Fass Fusako Tanabe 04/14/2017, 2:52 PM  04/14/2017  Donnella Sham, [place] [phone]  (pager)

## 2017-04-14 NOTE — Progress Notes (Signed)
CSW discussed with sister and plan is for patient to return to [facility] when stable. CSW to follow and support with discharge.  Abigail Butts, LCSWA [phone]

## 2017-04-14 NOTE — Consult Note (Signed)
   [facility] CM Inpatient Consult   04/14/2017  [name] 1950-11-09 [id number]    Chart reviewed for potential [facility] Care Management services.  Spoke with inpatient RNCM. [name] is from [facility] prior to admission.   Likely return to SNF at discharge.   [name], MSN-Ed, RN,BSN [facility] Liaison [phone]

## 2017-04-14 NOTE — Progress Notes (Signed)
Patient refused to have wound redressed due to pain, PRN given

## 2017-04-14 NOTE — [place] FL2 (Signed)
[place] MEDICAID FL2 LEVEL OF CARE SCREENING TOOL     IDENTIFICATION  Patient Name: [name] Birthdate: [birth year] Sex: male Admission Date (Current Location): 04/10/2017  [facility] and [place] Number:  Producer, television/film/video and Address:  [facility], [address] [place], [place] [address]      Provider Number: [id number]  Attending Physician Name and Address:  Joseph Art, DO  Relative Name and Phone Number:  Gasper Hopes, sister, [phone]    Current Level of Care: Hospital Recommended Level of Care: Skilled Nursing Facility Prior Approval Number:    Date Approved/Denied:   PASRR Number:    Discharge Plan: SNF    Current Diagnoses: Patient Active Problem List   Diagnosis Date Noted  . Hyponatremia 04/11/2017  . ARF (acute renal failure) (HCC) 04/11/2017  . Pressure injury of skin 03/24/2017  . Below knee amputation status, right (HCC) 03/23/2017  . DKA (diabetic ketoacidoses) (HCC) 03/14/2017  . Elevated troponin 03/14/2017  . Diabetic polyneuropathy associated with type 2 diabetes mellitus (HCC)   . Osteoarthritis of both knees   . Diabetic nephropathy associated with type 2 diabetes mellitus (HCC)   . Bilateral lower leg cellulitis 01/22/2017  . Venous stasis 08/06/2016  . At high risk for falls 07/21/2016  . CHF exacerbation (HCC) 06/10/2016  . Lactic acidosis 06/09/2016  . Syncope and collapse 03/05/2016  . Acute on chronic renal insufficiency 03/05/2016  . Obesity 12/30/2015  . Scrotal swelling   . Foot fracture   . 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 Lower GI bleed   . Pressure ulcer 12/24/2015  . Acute kidney injury (HCC) 12/23/2015  . Back pain 12/23/2015  . Fall   . Metatarsal fracture   . Traumatic rhabdomyolysis (HCC)   . Hyperkalemia   . Lisfranc dislocation   . Hyperglycemia   . Sepsis, unspecified organism (HCC) 09/08/2015  . Scrotal wall abscess 09/08/2015  . AKI (acute  kidney injury) (HCC) 09/08/2015  . Chest wall pain   . On amiodarone therapy 12/08/2014  . Respiratory failure with hypoxia (HCC) 12/04/2014  . Hypoxia 12/04/2014  . Acute on chronic respiratory failure with hypoxia (HCC)   . Atrial fibrillation with RVR (HCC) 09/20/2014  . Acute on chronic diastolic CHF (congestive heart failure) (HCC) 09/19/2014  . Acute respiratory failure (HCC) 09/19/2014  . Abnormal nuclear cardiac imaging test 02/12/2014  . Dyspnea 02/12/2014  . Chronic anticoagulation- Coumadin 02/12/2014  . CKD (chronic kidney disease) stage 3, GFR 30-59 ml/min (HCC) 02/12/2014  . Chronic diastolic congestive heart failure (HCC) 02/11/2014  . Encounter for therapeutic drug monitoring 08/03/2013  . Pilonidal cyst 01/25/2013  . Perirectal cellulitis 09/06/2012  . Long term (current) use of anticoagulants 11/18/2011  . Obstructive sleep apnea- unable to tolerate c-pap 08/04/2011  . CAD S/P percutaneous coronary angioplasty 08/04/2011  . ANEMIA-IRON DEFICIENCY 01/06/2009  . OTHER AND UNSPECIFIED COAGULATION DEFECTS 01/06/2009  . ANEMIA 01/02/2009  . CARDIOVASCULAR FUNCTION STUDY, ABNORMAL 01/02/2009  . Essential hypertension, benign 10/22/2008  . Atrial fibrillation 10/22/2008  . Hyperlipidemia 09/23/2008  . HYPOKALEMIA 09/23/2008  . Obesity, Class III, BMI 40-49.9 (morbid obesity) (HCC) 09/23/2008  . CELLULITIS AND ABSCESS OF LEG EXCEPT FOOT 09/23/2008    Orientation RESPIRATION BLADDER Height & Weight     Self, Time, Situation, Place  Normal   Weight: (!) 360 lb (163.3 kg) Height:  6\' 3"  (190.5 cm)  BEHAVIORAL SYMPTOMS/MOOD NEUROLOGICAL BOWEL NUTRITION STATUS  Diet (heart healthy/carb modified)  AMBULATORY STATUS COMMUNICATION OF NEEDS Skin   Extensive Assist Verbally                         Personal Care Assistance Level of Assistance  Bathing, Feeding, Dressing Bathing Assistance: Maximum assistance Feeding assistance: Limited assistance Dressing  Assistance: Maximum assistance     Functional Limitations Info  Sight, Hearing, Speech Sight Info: Adequate Hearing Info: Adequate Speech Info: Adequate    SPECIAL CARE FACTORS FREQUENCY        PT Frequency: 5x wk OT Frequency: 5x wk            Contractures      Additional Factors Info                  Current Medications (04/14/2017):  This is the current hospital active medication list Current Facility-Administered Medications  Medication Dose Route Frequency Provider Last Rate Last Dose  . acetaminophen (TYLENOL) tablet 650 mg  650 mg Oral Q6H PRN Pearson Grippe, MD   650 mg at 04/14/17 1214   Or  . acetaminophen (TYLENOL) suppository 650 mg  650 mg Rectal Q6H PRN Pearson Grippe, MD      . atorvastatin (LIPITOR) tablet 80 mg  80 mg Oral Farrel Demark, MD   80 mg at 04/13/17 2245  . Chlorhexidine Gluconate Cloth 2 % PADS 6 each  6 each Topical Q0600 Marlin Canary U, DO   6 each at 04/14/17 0600  . diltiazem (CARDIZEM CD) 24 hr capsule 240 mg  240 mg Oral Daily Pearson Grippe, MD   240 mg at 04/14/17 1024  . docusate sodium (COLACE) capsule 100 mg  100 mg Oral Daily Pearson Grippe, MD   100 mg at 04/14/17 1024  . feeding supplement (PRO-STAT SUGAR FREE 64) liquid 30 mL  30 mL Oral BID Vann, Jessica U, DO   30 mL at 04/14/17 1023  . gabapentin (NEURONTIN) capsule 600 mg  600 mg Oral BID Pearson Grippe, MD   600 mg at 04/14/17 1023  . HYDROmorphone (DILAUDID) injection 1-2 mg  1-2 mg Intravenous Q4H PRN Benjamine Mola, Jessica U, DO      . insulin aspart (novoLOG) injection 0-20 Units  0-20 Units Subcutaneous TID WC Vann, Jessica U, DO   11 Units at 04/14/17 1347  . insulin aspart (novoLOG) injection 0-5 Units  0-5 Units Subcutaneous QHS Marlin Canary U, DO   3 Units at 04/11/17 2340  . insulin aspart (novoLOG) injection 20 Units  20 Units Subcutaneous TID WC Vann, Jessica U, DO   20 Units at 04/14/17 1347  . [START ON 04/15/2017] insulin glargine (LANTUS) injection 90 Units  90 Units Subcutaneous  Daily Vann, Jessica U, DO      . lidocaine (LMX) 4 % cream   Topical Once Vann, Jessica U, DO      . metoprolol tartrate (LOPRESSOR) tablet 50 mg  50 mg Oral BID Vann, Jessica U, DO   50 mg at 04/14/17 1023  . multivitamin with minerals tablet 1 tablet  1 tablet Oral Daily Pearson Grippe, MD   1 tablet at 04/14/17 1023  . mupirocin ointment (BACTROBAN) 2 % 1 application  1 application Nasal BID Marlin Canary U, DO   1 application at 04/14/17 1024  . oxyCODONE (Oxy IR/ROXICODONE) immediate release tablet 20 mg  20 mg Oral Q4H PRN Benjamine Mola, Jessica U, DO   20 mg at 04/14/17 1213  . potassium chloride  SA (K-DUR,KLOR-CON) CR tablet 40 mEq  40 mEq Oral BID Pearson Grippe, MD   40 mEq at 04/14/17 1024  . spironolactone (ALDACTONE) tablet 25 mg  25 mg Oral Daily Marlin Canary U, DO   25 mg at 04/14/17 1024  . torsemide (DEMADEX) tablet 80 mg  80 mg Oral BID Marlin Canary U, DO   80 mg at 04/14/17 0539     Discharge Medications: Please see discharge summary for a list of discharge medications.  Relevant Imaging Results:  Relevant Lab Results:   Additional Information SS# [id number] ( on MRSA precautions )  Althea Charon, LCSW

## 2017-04-15 LAB — GLUCOSE, CAPILLARY
GLUCOSE-CAPILLARY: 159 mg/dL — AB (ref 65–99)
GLUCOSE-CAPILLARY: 282 mg/dL — AB (ref 65–99)
Glucose-Capillary: 170 mg/dL — ABNORMAL HIGH (ref 65–99)
Glucose-Capillary: 94 mg/dL (ref 65–99)
Glucose-Capillary: 94 mg/dL (ref 65–99)

## 2017-04-15 LAB — CBC
HEMATOCRIT: 28.1 % — AB (ref 39.0–52.0)
HEMOGLOBIN: 8.7 g/dL — AB (ref 13.0–17.0)
MCH: 23.4 pg — ABNORMAL LOW (ref 26.0–34.0)
MCHC: 31 g/dL (ref 30.0–36.0)
MCV: 75.5 fL — AB (ref 78.0–100.0)
Platelets: 404 10*3/uL — ABNORMAL HIGH (ref 150–400)
RBC: 3.72 MIL/uL — ABNORMAL LOW (ref 4.22–5.81)
RDW: 21.2 % — AB (ref 11.5–15.5)
WBC: 22.6 10*3/uL — AB (ref 4.0–10.5)

## 2017-04-15 LAB — BASIC METABOLIC PANEL
ANION GAP: 10 (ref 5–15)
BUN: 44 mg/dL — ABNORMAL HIGH (ref 6–20)
CALCIUM: 8.9 mg/dL (ref 8.9–10.3)
CHLORIDE: 89 mmol/L — AB (ref 101–111)
CO2: 30 mmol/L (ref 22–32)
Creatinine, Ser: 1.5 mg/dL — ABNORMAL HIGH (ref 0.61–1.24)
GFR calc Af Amer: 55 mL/min — ABNORMAL LOW (ref >60)
GFR calc non Af Amer: 47 mL/min — ABNORMAL LOW (ref >60)
GLUCOSE: 150 mg/dL — AB (ref 65–99)
POTASSIUM: 4 mmol/L (ref 3.5–5.1)
Sodium: 129 mmol/L — ABNORMAL LOW (ref 135–145)

## 2017-04-15 MED ORDER — COLLAGENASE 250 UNIT/GM EX OINT
TOPICAL_OINTMENT | Freq: Every day | CUTANEOUS | Status: DC
  Administered 2017-04-16 – 2017-04-21 (×5): via TOPICAL
  Filled 2017-04-15 (×2): qty 30

## 2017-04-15 MED ORDER — SILVER NITRATE-POT NITRATE 75-25 % EX MISC
10.0000 | CUTANEOUS | Status: DC | PRN
  Administered 2017-04-19: 10 via TOPICAL
  Filled 2017-04-15 (×2): qty 10

## 2017-04-15 MED ORDER — LEVOFLOXACIN 750 MG PO TABS
750.0000 mg | ORAL_TABLET | Freq: Every day | ORAL | Status: AC
  Administered 2017-04-15 – 2017-04-19 (×5): 750 mg via ORAL
  Filled 2017-04-15 (×5): qty 1

## 2017-04-15 MED ORDER — RIVAROXABAN 20 MG PO TABS
20.0000 mg | ORAL_TABLET | Freq: Every day | ORAL | Status: DC
  Administered 2017-04-15 – 2017-04-21 (×7): 20 mg via ORAL
  Filled 2017-04-15 (×8): qty 1

## 2017-04-15 MED ORDER — LIDOCAINE 4 % EX CREA
TOPICAL_CREAM | Freq: Every day | CUTANEOUS | Status: DC | PRN
  Administered 2017-04-18 – 2017-04-19 (×2): 1 via TOPICAL
  Filled 2017-04-15 (×3): qty 5

## 2017-04-15 MED ORDER — OXYCODONE HCL 5 MG PO TABS
20.0000 mg | ORAL_TABLET | Freq: Four times a day (QID) | ORAL | Status: DC | PRN
  Administered 2017-04-15 (×2): 20 mg via ORAL
  Filled 2017-04-15 (×3): qty 4

## 2017-04-15 MED ORDER — HYDROMORPHONE HCL 1 MG/ML IJ SOLN: 0.5000 mg | Freq: Once | INTRAMUSCULAR | Status: DC | PRN

## 2017-04-15 MED ORDER — HYDROMORPHONE HCL 1 MG/ML IJ SOLN
0.5000 mg | Freq: Every day | INTRAMUSCULAR | Status: DC | PRN
  Administered 2017-04-15 – 2017-04-18 (×2): 0.5 mg via INTRAVENOUS
  Filled 2017-04-15 (×3): qty 0.5

## 2017-04-15 MED ORDER — LIDOCAINE HCL (PF) 1 % IJ SOLN
INTRAMUSCULAR | Status: AC
  Filled 2017-04-15: qty 5

## 2017-04-15 NOTE — Progress Notes (Signed)
Refuses to wear 02, very irritable, c/o uncontrolled pain

## 2017-04-15 NOTE — Care Management Important Message (Signed)
Important Message  Patient Details  Name: [name] MRN: [id number] Date of Birth: July 26, 1950   Medicare Important Message Given:  Yes    Taydem Cavagnaro, [name], RN 04/15/2017, 3:23 PM

## 2017-04-15 NOTE — Progress Notes (Signed)
Physical Therapy Wound Treatment Patient Details  Name: [name] MRN: [id number] Date of Birth: Oct 27, 1950  Today's Date: 04/15/2017 Time: 2981-2985 Time Calculation (min): 62 min  Subjective  Subjective: I'm tired of people lying to me.  Pain Score:  up to 8-10/10, writhing in pain    Wound Assessment  Pressure Injury 03/23/17 Unstageable - Full thickness tissue loss in which the base of the ulcer is covered by slough (yellow, tan, gray, green or brown) and/or eschar (tan, brown or black) in the wound bed. WOC updated 04/13/17 unstageable (Active)  Dressing Type ABD;Barrier Film (skin prep);Gauze (Comment);Moist to dry 04/15/2017 12:53 PM  Dressing Changed;Intact 04/15/2017 12:53 PM  Dressing Change Frequency Daily 04/15/2017 12:53 PM  State of Healing Eschar 04/15/2017 12:53 PM  Site / Wound Assessment Yellow;Black;Brown;Bleeding 04/15/2017 12:53 PM  % Wound base Red or Granulating 10% 04/15/2017 12:53 PM  % Wound base Yellow/Fibrinous Exudate 90% 04/15/2017 12:53 PM  % Wound base Black/Eschar 0% 04/15/2017 12:53 PM  % Wound base Other/Granulation Tissue (Comment) 5% 04/15/2017 12:53 PM  Peri-wound Assessment Erythema (blanchable) 04/15/2017 12:53 PM  Wound Length (cm) 12.5 cm 04/14/2017  2:38 PM  Wound Width (cm) 12.5 cm 04/14/2017  2:38 PM  Wound Depth (cm) 2 cm 04/14/2017  2:38 PM  Wound Surface Area (cm^2) 156.25 cm^2 04/14/2017  2:38 PM  Wound Volume (cm^3) 312.5 cm^3 04/14/2017  2:38 PM  Margins Unattached edges (unapproximated) 04/15/2017 12:53 PM  Drainage Amount Moderate 04/15/2017 12:53 PM  Drainage Description Green;Odor 04/15/2017 12:53 PM  Treatment Cleansed;Debridement (Selective);Hydrotherapy (Pulse lavage);Packing (Saline gauze) 04/15/2017 12:53 PM  Topical lidocaine for decreasing pain.   Hydrotherapy Pulsed lavage therapy - wound location: sacral Pulsed Lavage with Suction (psi): 8 psi (to 12) Pulsed Lavage with Suction - Normal Saline Used: 1000 mL Pulsed Lavage Tip:  Tip with splash shield Selective Debridement Selective Debridement - Location: sacral Selective Debridement - Tools Used: Forceps;Scalpel;Scissors Selective Debridement - Tissue Removed: slough, eschar and necrosing adipose   Wound Assessment and Plan  Wound Therapy - Assess/Plan/Recommendations Wound Therapy - Clinical Statement: pt could benefit from hydro for PLS to cleanse soften and decrease bacterial load, to prep for selective debridement.  Pt has been unable to tolerate the pain of debridement and have asked MD for break through pain meds and topical lidocaine. Wound Therapy - Functional Problem List: pt unable to spend any significant time in sitting due to this wound and  become progressively less mobile. Factors Delaying/Impairing Wound Healing: Diabetes Mellitus;Immobility;Multiple medical problems Hydrotherapy Plan: Debridement;Dressing change;Patient/family education;Pulsatile lavage with suction Wound Therapy - Frequency: 6X / week Wound Therapy - Current Recommendations: PT;WOC nurse Wound Therapy - Follow Up Recommendations: Skilled nursing facility Wound Plan: see above  Wound Therapy Goals- Improve the function of patient's integumentary system by progressing the wound(s) through the phases of wound healing (inflammation - proliferation - remodeling) by: Decrease Necrotic Tissue to: 50 Decrease Necrotic Tissue - Progress: Progressing toward goal Increase Granulation Tissue to: 50% Increase Granulation Tissue - Progress: Progressing toward goal Improve Drainage Characteristics: Min;Serous Goals/treatment plan/discharge plan were made with and agreed upon by patient/family: Yes Time For Goal Achievement: 7 days Wound Therapy - Potential for Goals: Good  Goals will be updated until maximal potential achieved or discharge criteria met.  Discharge criteria: when goals achieved, discharge from hospital, MD decision/surgical intervention, no progress towards goals,  refusal/missing three consecutive treatments without notification or medical reason.  GP     Eliseo Gum Ramonica Grigg 04/15/2017, 12:57 PM  04/15/2017  Donnella Sham, PT [phone]  (pager)

## 2017-04-15 NOTE — Progress Notes (Addendum)
PROGRESS NOTE    [name]  ATF:[id number] DOB: 1950/11/06 DOA: 04/10/2017 PCP: [name], MD   Brief Narrative: [name] is a 66 y.o. male,w hypertension, hyperlipidemia, Dm2, w R BKA, CKD stage3, Pafib, CAD apparently presented due to bleeding from large sacral decubitus ulcer. Pt sent to ED for evaluation. He received 2 units of PRBC to date in addition to debridement. He is currently undergoing hydrotherapy. Levaquin added for leukocytosis in addition to exudative material concerning for pseudomonal infection.   Assessment & Plan:   Principal Problem:   Hyperglycemia Active Problems:   Obesity, Class III, BMI 40-49.9 (morbid obesity) (HCC)   Atrial fibrillation   Below knee amputation status, right (HCC)   Hyponatremia   ARF (acute renal failure) (HCC)   Sacral ulcer S/p debridement on 10/3. Hydrotherapy and dressing changes currently. Afebrile. No erythema. -general surgery recommendations -continue dilaudid prn for hydrotherapy  Leukocytosis Afebrile. Ulcer without evidence of active infection. No wound cultures available. Trending up, however. -Levaquin for possible infection of ulcer  Diabetes mellitus, type 2 with hyperglycemia -continue SSI -continue Lantus 90 units qhs  Hyponatremia Trending up. Asymptomatic.  Anemia Secondary to blood loss. Received 2 units of PRBC on 10/2 -trend  Morbid obesity Body mass index is 45 kg/m.  Atrial fibrillation Rate controlled. Xarelto held secondary to bleeding and debridement. -restart Xarelto as there are no further plans for debridement.  Chronic narcotic use Patient seen by pain management. Expressing some manipulative actions with regard to his current pain regimen -continue Oxycodone 20mg  four times daily, spaced at least 4 hours apart   DVT prophylaxis: SCDs, Xarelto Code Status: DNR Family Communication: None at bedside Disposition Plan: Discharge pending wound  care   Consultants:   General surgery  Procedures:   Debridement of sacral wound (10/2)  Antimicrobials:  Levaquin (10/5>>    Subjective: No complaints.  Objective: Vitals:   04/14/17 2239 04/14/17 2245 04/15/17 0509 04/15/17 0857  BP: 120/60 (!) 121/49 (!) 115/54 (!) 113/39  Pulse:  80 89 69  Resp:  18 18 18   Temp:  98.9 F (37.2 C) 98.4 F (36.9 C) 98.5 F (36.9 C)  TempSrc:  Oral Oral Oral  SpO2:  100% 100% 100%  Weight:      Height:        Intake/Output Summary (Last 24 hours) at 04/15/17 0904 Last data filed at 04/15/17 0848  Gross per 24 hour  Intake             1026 ml  Output             2900 ml  Net            -1874 ml   Filed Weights   04/10/17 2305  Weight: (!) 163.3 kg (360 lb)    Examination:  General exam: Appears calm and comfortable. Morbidly obese Respiratory system: Respiratory effort normal. Gastrointestinal system: Abdomen is nondistended, soft and nontender, obese. No organomegaly or masses felt. Normal bowel sounds heard. Central nervous system: Alert and oriented. No focal neurological deficits. Extremities: No edema. No calf tenderness Skin: No cyanosis. Psychiatry: Judgement and insight appear normal. Mood & affect appropriate.     Data Reviewed: I have personally reviewed following labs and imaging studies  CBC:  Recent Labs Lab 04/11/17 0001 04/11/17 0428 04/12/17 0414 04/13/17 0216 04/14/17 0243 04/15/17 0325  WBC 15.3* 16.6* 16.5* 16.1* 16.2* 22.6*  NEUTROABS 11.6*  --   --   --   --   --  HGB 8.8* 7.6* 6.8* 8.0* 8.0* 8.7*  HCT 29.0* 24.8* 22.2* 24.9* 25.9* 28.1*  MCV 70.2* 70.5* 69.8* 73.7* 74.6* 75.5*  PLT 446* 417* 367 313 308 404*   Basic Metabolic Panel:  Recent Labs Lab 04/11/17 0428 04/12/17 0414 04/13/17 0216 04/14/17 0243 04/15/17 0325  NA 125* 126* 128* 127* 129*  K 4.3 3.9 4.0 3.7 4.0  CL 87* 84* 88* 88* 89*  CO2 19* 28 27 26 30   GLUCOSE 435* 378* 187* 272* 150*  BUN 50* 55* 52* 46*  44*  CREATININE 1.93* 2.06* 1.56* 1.45* 1.50*  CALCIUM 8.6* 8.6* 8.8* 8.4* 8.9   GFR: Estimated Creatinine Clearance: 80.6 mL/min (A) (by C-G formula based on SCr of 1.5 mg/dL (H)). Liver Function Tests:  Recent Labs Lab 04/11/17 0428  AST 21  ALT 11*  ALKPHOS 87  BILITOT 1.4*  PROT 7.5  ALBUMIN 2.5*   No results for input(s): LIPASE, AMYLASE in the last 168 hours. No results for input(s): AMMONIA in the last 168 hours. Coagulation Profile: No results for input(s): INR, PROTIME in the last 168 hours. Cardiac Enzymes: No results for input(s): CKTOTAL, CKMB, CKMBINDEX, TROPONINI in the last 168 hours. BNP (last 3 results) No results for input(s): PROBNP in the last 8760 hours. HbA1C: No results for input(s): HGBA1C in the last 72 hours. CBG:  Recent Labs Lab 04/13/17 1721 04/14/17 0842 04/14/17 1240 04/14/17 2217 04/15/17 0825  GLUCAP 194* 343* 259* 182* 282*   Lipid Profile: No results for input(s): CHOL, HDL, LDLCALC, TRIG, CHOLHDL, LDLDIRECT in the last 72 hours. Thyroid Function Tests: No results for input(s): TSH, T4TOTAL, FREET4, T3FREE, THYROIDAB in the last 72 hours. Anemia Panel: No results for input(s): VITAMINB12, FOLATE, FERRITIN, TIBC, IRON, RETICCTPCT in the last 72 hours. Sepsis Labs: No results for input(s): PROCALCITON, LATICACIDVEN in the last 168 hours.  Recent Results (from the past 240 hour(s))  MRSA PCR Screening     Status: Abnormal   Collection Time: 04/13/17 11:42 AM  Result Value Ref Range Status   MRSA by PCR POSITIVE (A) NEGATIVE Final    Comment:        The GeneXpert MRSA Assay (FDA approved for NASAL specimens only), is one component of a comprehensive MRSA colonization surveillance program. It is not intended to diagnose MRSA infection nor to guide or monitor treatment for MRSA infections. RESULT CALLED TO, READ BACK BY AND VERIFIED WITH: A. Mabe RN 13:10 04/13/17 ([email or web address])          Radiology Studies: No results  found.      Scheduled Meds: . atorvastatin  80 mg Oral QHS  . Chlorhexidine Gluconate Cloth  6 each Topical Q0600  . diltiazem  240 mg Oral Daily  . docusate sodium  100 mg Oral Daily  . feeding supplement (PRO-STAT SUGAR FREE 64)  30 mL Oral BID  . gabapentin  600 mg Oral BID  . insulin aspart  0-20 Units Subcutaneous TID WC  . insulin aspart  0-5 Units Subcutaneous QHS  . insulin aspart  20 Units Subcutaneous TID WC  . insulin glargine  90 Units Subcutaneous Daily  . lidocaine   Topical Once  . metoprolol tartrate  50 mg Oral BID  . multivitamin with minerals  1 tablet Oral Daily  . mupirocin ointment  1 application Nasal BID  . potassium chloride SA  40 mEq Oral BID  . spironolactone  25 mg Oral Daily  . torsemide  80 mg Oral BID   Continuous Infusions:  LOS: 3 days     Jacquelin Hawking, MD [name] 04/15/2017, 9:04 AM Pager: [phone]  If 7PM-7AM, please contact night-coverage [email or web address] Password [email or web address] 04/15/2017, 9:04 AM

## 2017-04-15 NOTE — Progress Notes (Signed)
Patient ID: [name], male   DOB: 05/15/1951, 66 y.o.   MRN: [id number]  [facility] Surgery Progress Note     Subjective: CC- Sacral decubitus ulcer Patient currently undergoing hydrotherapy. Tolerating a little better with topical lidocaine.  Objective: Vital signs in last 24 hours: Temp:  [98.4 F (36.9 C)-98.9 F (37.2 C)] 98.5 F (36.9 C) (10/05 0857) Pulse Rate:  [69-89] 69 (10/05 0857) Resp:  [18] 18 (10/05 0857) BP: (113-130)/(39-60) 113/39 (10/05 0857) SpO2:  [100 %] 100 % (10/05 0857) Last BM Date: 04/14/17  Intake/Output from previous day: 10/04 0701 - 10/05 0700 In: 1026 [P.O.:1026] Out: 3300 [Urine:3300] Intake/Output this shift: Total I/O In: 240 [P.O.:240] Out: 0   PE: Gen:  Alert, NAD Pulm:  effort normal Sacrum:     Lab Results:   Recent Labs  04/14/17 0243 04/15/17 0325  WBC 16.2* 22.6*  HGB 8.0* 8.7*  HCT 25.9* 28.1*  PLT 308 404*   BMET  Recent Labs  04/14/17 0243 04/15/17 0325  NA 127* 129*  K 3.7 4.0  CL 88* 89*  CO2 26 30  GLUCOSE 272* 150*  BUN 46* 44*  CREATININE 1.45* 1.50*  CALCIUM 8.4* 8.9   PT/INR No results for input(s): LABPROT, INR in the last 72 hours. CMP     Component Value Date/Time   NA 129 (L) 04/15/2017 0325   NA 136 (A) 03/22/2017   K 4.0 04/15/2017 0325   CL 89 (L) 04/15/2017 0325   CO2 30 04/15/2017 0325   GLUCOSE 150 (H) 04/15/2017 0325   BUN 44 (H) 04/15/2017 0325   BUN 34 (A) 03/22/2017   CREATININE 1.50 (H) 04/15/2017 0325   CREATININE 1.57 (H) 08/06/2016 1438   CALCIUM 8.9 04/15/2017 0325   PROT 7.5 04/11/2017 0428   ALBUMIN 2.5 (L) 04/11/2017 0428   AST 21 04/11/2017 0428   ALT 11 (L) 04/11/2017 0428   ALKPHOS 87 04/11/2017 0428   BILITOT 1.4 (H) 04/11/2017 0428   GFRNONAA 47 (L) 04/15/2017 0325   GFRNONAA 46 (L) 08/06/2016 1438   GFRAA 55 (L) 04/15/2017 0325   GFRAA 53 (L) 08/06/2016 1438   Lipase  No results found for: LIPASE     Studies/Results: No results  found.  Anti-infectives: Anti-infectives    None       Assessment/Plan Sacral decubitus ulcer - appears to be cleaning up well with hydrotherapy, would not recommend any surgical debridement in the OR. Continue hydrotherapy and santyl.   LOS: 3 days    Franne Forts , [facility] Surgery 04/15/2017, 10:57 AM Pager: [phone] Consults: [phone] Mon-Fri 7:00 am-4:30 pm Sat-Sun 7:00 am-11:30 am

## 2017-04-16 LAB — GLUCOSE, CAPILLARY
GLUCOSE-CAPILLARY: 112 mg/dL — AB (ref 65–99)
Glucose-Capillary: 377 mg/dL — ABNORMAL HIGH (ref 65–99)
Glucose-Capillary: 283 mg/dL — ABNORMAL HIGH (ref 65–99)
Glucose-Capillary: 183 mg/dL — ABNORMAL HIGH (ref 65–99)

## 2017-04-16 MED ORDER — OXYCODONE HCL 5 MG PO TABS
20.0000 mg | ORAL_TABLET | ORAL | Status: DC | PRN
  Administered 2017-04-16 – 2017-04-21 (×26): 20 mg via ORAL
  Filled 2017-04-16 (×27): qty 4

## 2017-04-16 NOTE — Progress Notes (Signed)
PT Cancellation Note  Patient Details Name: JACOBS GOLAB MRN: [id number] DOB: 03-28-1951   Cancelled Treatment:    Reason Eval/Treat Not Completed: Pain limiting ability to participate.  Patient politely declined hydrotherapy today.  Explained benefit of hydro and impact on healing.  Patient continued to decline.  Will attempt again this afternoon.   Olivia Canter 04/16/2017, 10:20 AM

## 2017-04-16 NOTE — Progress Notes (Signed)
Pt refusing foley cath. MD made aware. [place] Carbo, RN

## 2017-04-16 NOTE — Progress Notes (Signed)
PROGRESS NOTE    [name]  GNF:[id number] DOB: 04-05-1951 DOA: 04/10/2017 PCP: [name], MD   Brief Narrative: [name] is a 66 y.o. male,w hypertension, hyperlipidemia, Dm2, w R BKA, CKD stage3, Pafib, CAD apparently presented due to bleeding from large sacral decubitus ulcer. Pt sent to ED for evaluation. He received 2 units of PRBC to date in addition to debridement. He is currently undergoing hydrotherapy. Levaquin added for leukocytosis in addition to exudative material concerning for pseudomonal infection.   Assessment & Plan:   Principal Problem:   Hyperglycemia Active Problems:   Obesity, Class III, BMI 40-49.9 (morbid obesity) (HCC)   Atrial fibrillation   Below knee amputation status, right (HCC)   Hyponatremia   ARF (acute renal failure) (HCC)   Sacral ulcer S/p debridement on 10/3. Hydrotherapy and dressing changes currently. Afebrile. No erythema. -general surgery recommendations -continue dilaudid prn for hydrotherapy  Leukocytosis Afebrile. Ulcer without evidence of active infection. No wound cultures available. Trending up, however. -continue Levaquin for possible infection of ulcer  Diabetes mellitus, type 2 with hyperglycemia Uncontrolled. -continue SSI -continue Lantus 90 units qhs  Hyponatremia Trending up. Asymptomatic.  Anemia Secondary to blood loss. Received 2 units of PRBC on 10/2 -trend  Morbid obesity Body mass index is 45 kg/m.  Atrial fibrillation Rate controlled. Xarelto held secondary to bleeding and debridement. -restart Xarelto as there are no further plans for debridement.  Chronic narcotic use Patient seen by pain management. -continue Oxycodone 20mg  q4 hours prn   DVT prophylaxis: SCDs, Xarelto Code Status: DNR Family Communication: None at bedside Disposition Plan: Discharge pending wound care   Consultants:   General surgery  Procedures:   Debridement of sacral wound  (10/2)  Antimicrobials:  Levaquin (10/5>>    Subjective: No complaints.  Objective: Vitals:   04/15/17 1211 04/15/17 1824 04/16/17 0553 04/16/17 0757  BP: 122/67 (!) 132/57 135/60 (!) 119/47  Pulse: (!) 104 77 73 78  Resp:  18 18 18   Temp:  98 F (36.7 C) 98.5 F (36.9 C) 97.9 F (36.6 C)  TempSrc:  Oral Oral Oral  SpO2:  100% 100% 99%  Weight:      Height:        Intake/Output Summary (Last 24 hours) at 04/16/17 1147 Last data filed at 04/16/17 0601  Gross per 24 hour  Intake                0 ml  Output             2500 ml  Net            -2500 ml   Filed Weights   04/10/17 2305  Weight: (!) 163.3 kg (360 lb)    Examination:  General exam: Appears calm and comfortable. Morbidly obese Respiratory system: Respiratory effort normal. Gastrointestinal system: Abdomen is nondistended, soft and nontender, obese. No organomegaly or masses felt. Normal bowel sounds heard. Central nervous system: Alert and oriented. No focal neurological deficits. Extremities: No edema. No calf tenderness Skin: No cyanosis. Psychiatry: Judgement and insight appear normal. Mood & affect appropriate.     Data Reviewed: I have personally reviewed following labs and imaging studies  CBC:  Recent Labs Lab 04/11/17 0001 04/11/17 0428 04/12/17 0414 04/13/17 0216 04/14/17 0243 04/15/17 0325  WBC 15.3* 16.6* 16.5* 16.1* 16.2* 22.6*  NEUTROABS 11.6*  --   --   --   --   --   HGB 8.8* 7.6* 6.8* 8.0* 8.0* 8.7*  HCT 29.0* 24.8* 22.2* 24.9* 25.9* 28.1*  MCV 70.2* 70.5* 69.8* 73.7* 74.6* 75.5*  PLT 446* 417* 367 313 308 404*   Basic Metabolic Panel:  Recent Labs Lab 04/11/17 0428 04/12/17 0414 04/13/17 0216 04/14/17 0243 04/15/17 0325  NA 125* 126* 128* 127* 129*  K 4.3 3.9 4.0 3.7 4.0  CL 87* 84* 88* 88* 89*  CO2 19* 28 27 26 30   GLUCOSE 435* 378* 187* 272* 150*  BUN 50* 55* 52* 46* 44*  CREATININE 1.93* 2.06* 1.56* 1.45* 1.50*  CALCIUM 8.6* 8.6* 8.8* 8.4* 8.9    GFR: Estimated Creatinine Clearance: 80.6 mL/min (A) (by C-G formula based on SCr of 1.5 mg/dL (H)). Liver Function Tests:  Recent Labs Lab 04/11/17 0428  AST 21  ALT 11*  ALKPHOS 87  BILITOT 1.4*  PROT 7.5  ALBUMIN 2.5*   No results for input(s): LIPASE, AMYLASE in the last 168 hours. No results for input(s): AMMONIA in the last 168 hours. Coagulation Profile: No results for input(s): INR, PROTIME in the last 168 hours. Cardiac Enzymes: No results for input(s): CKTOTAL, CKMB, CKMBINDEX, TROPONINI in the last 168 hours. BNP (last 3 results) No results for input(s): PROBNP in the last 8760 hours. HbA1C: No results for input(s): HGBA1C in the last 72 hours. CBG:  Recent Labs Lab 04/15/17 0825 04/15/17 1226 04/15/17 1636 04/15/17 2153 04/16/17 0755  GLUCAP 282* 170* 94 94 377*   Lipid Profile: No results for input(s): CHOL, HDL, LDLCALC, TRIG, CHOLHDL, LDLDIRECT in the last 72 hours. Thyroid Function Tests: No results for input(s): TSH, T4TOTAL, FREET4, T3FREE, THYROIDAB in the last 72 hours. Anemia Panel: No results for input(s): VITAMINB12, FOLATE, FERRITIN, TIBC, IRON, RETICCTPCT in the last 72 hours. Sepsis Labs: No results for input(s): PROCALCITON, LATICACIDVEN in the last 168 hours.  Recent Results (from the past 240 hour(s))  MRSA PCR Screening     Status: Abnormal   Collection Time: 04/13/17 11:42 AM  Result Value Ref Range Status   MRSA by PCR POSITIVE (A) NEGATIVE Final    Comment:        The GeneXpert MRSA Assay (FDA approved for NASAL specimens only), is one component of a comprehensive MRSA colonization surveillance program. It is not intended to diagnose MRSA infection nor to guide or monitor treatment for MRSA infections. RESULT CALLED TO, READ BACK BY AND VERIFIED WITH: A. Mabe RN 13:10 04/13/17 ([email or web address])          Radiology Studies: No results found.      Scheduled Meds: . atorvastatin  80 mg Oral QHS  . Chlorhexidine  Gluconate Cloth  6 each Topical Q0600  . collagenase   Topical Daily  . diltiazem  240 mg Oral Daily  . docusate sodium  100 mg Oral Daily  . feeding supplement (PRO-STAT SUGAR FREE 64)  30 mL Oral BID  . gabapentin  600 mg Oral BID  . insulin aspart  0-20 Units Subcutaneous TID WC  . insulin aspart  0-5 Units Subcutaneous QHS  . insulin aspart  20 Units Subcutaneous TID WC  . insulin glargine  90 Units Subcutaneous Daily  . levofloxacin  750 mg Oral Daily  . lidocaine   Topical Once  . metoprolol tartrate  50 mg Oral BID  . multivitamin with minerals  1 tablet Oral Daily  . mupirocin ointment  1 application Nasal BID  . potassium chloride SA  40 mEq Oral BID  . rivaroxaban  20 mg Oral Q supper  . spironolactone  25 mg Oral Daily  . torsemide  80 mg Oral BID   Continuous Infusions:   LOS: 4 days     Jacquelin Hawking, MD [name] 04/16/2017, 11:47 AM Pager: [phone]  If 7PM-7AM, please contact night-coverage [email or web address] Password [email or web address] 04/16/2017, 11:47 AM

## 2017-04-16 NOTE — Progress Notes (Signed)
PT Cancellation Note  Patient Details Name: [name] MRN: [id number] DOB: [birth year]   Cancelled Treatment:    Reason Eval/Treat Not Completed: Patient declined, stating "not today" - it just hurts too much.   Olivia Canter 04/16/2017, 1:45 PM

## 2017-04-17 DIAGNOSIS — N189 Chronic kidney disease, unspecified: Secondary | ICD-10-CM

## 2017-04-17 LAB — CBC
HEMATOCRIT: 28.4 % — AB (ref 39.0–52.0)
HEMOGLOBIN: 8.8 g/dL — AB (ref 13.0–17.0)
MCH: 23.2 pg — AB (ref 26.0–34.0)
MCHC: 31 g/dL (ref 30.0–36.0)
MCV: 74.7 fL — AB (ref 78.0–100.0)
Platelets: 392 10*3/uL (ref 150–400)
RBC: 3.8 MIL/uL — ABNORMAL LOW (ref 4.22–5.81)
RDW: 21.1 % — AB (ref 11.5–15.5)
WBC: 21.3 10*3/uL — ABNORMAL HIGH (ref 4.0–10.5)

## 2017-04-17 LAB — BASIC METABOLIC PANEL
Anion gap: 13 (ref 5–15)
BUN: 59 mg/dL — AB (ref 6–20)
CHLORIDE: 86 mmol/L — AB (ref 101–111)
CO2: 28 mmol/L (ref 22–32)
Calcium: 8.9 mg/dL (ref 8.9–10.3)
Creatinine, Ser: 1.85 mg/dL — ABNORMAL HIGH (ref 0.61–1.24)
GFR calc Af Amer: 42 mL/min — ABNORMAL LOW (ref >60)
GFR calc non Af Amer: 37 mL/min — ABNORMAL LOW (ref >60)
GLUCOSE: 161 mg/dL — AB (ref 65–99)
POTASSIUM: 5.3 mmol/L — AB (ref 3.5–5.1)
Sodium: 127 mmol/L — ABNORMAL LOW (ref 135–145)

## 2017-04-17 LAB — GLUCOSE, CAPILLARY
GLUCOSE-CAPILLARY: 424 mg/dL — AB (ref 65–99)
GLUCOSE-CAPILLARY: 404 mg/dL — AB (ref 65–99)
GLUCOSE-CAPILLARY: 293 mg/dL — AB (ref 65–99)
GLUCOSE-CAPILLARY: 172 mg/dL — AB (ref 65–99)

## 2017-04-17 MED ORDER — SENNOSIDES-DOCUSATE SODIUM 8.6-50 MG PO TABS
1.0000 | ORAL_TABLET | Freq: Two times a day (BID) | ORAL | Status: DC
  Administered 2017-04-17 – 2017-04-21 (×4): 1 via ORAL
  Filled 2017-04-17 (×8): qty 1

## 2017-04-17 MED ORDER — DIPHENHYDRAMINE HCL 25 MG PO CAPS: 25.0000 mg | ORAL_CAPSULE | Freq: Once | ORAL | Status: DC

## 2017-04-17 MED ORDER — POLYETHYLENE GLYCOL 3350 17 G PO PACK
17.0000 g | PACK | Freq: Two times a day (BID) | ORAL | Status: DC
  Administered 2017-04-17: 17 g via ORAL
  Filled 2017-04-17 (×8): qty 1

## 2017-04-17 MED ORDER — TORSEMIDE 20 MG PO TABS
40.0000 mg | ORAL_TABLET | Freq: Two times a day (BID) | ORAL | Status: DC
  Administered 2017-04-17 (×2): 40 mg via ORAL
  Filled 2017-04-17 (×3): qty 2

## 2017-04-17 MED ORDER — INSULIN GLARGINE 100 UNIT/ML ~~LOC~~ SOLN
10.0000 [IU] | Freq: Every day | SUBCUTANEOUS | Status: DC
  Administered 2017-04-17: 10 [IU] via SUBCUTANEOUS
  Filled 2017-04-17 (×2): qty 0.1

## 2017-04-17 MED ORDER — INSULIN GLARGINE 100 UNIT/ML ~~LOC~~ SOLN
80.0000 [IU] | Freq: Every day | SUBCUTANEOUS | Status: DC
  Administered 2017-04-18: 80 [IU] via SUBCUTANEOUS
  Filled 2017-04-17 (×3): qty 0.8

## 2017-04-17 NOTE — Progress Notes (Signed)
PROGRESS NOTE    [name]  GBE:[id number] DOB: 08-Aug-1950 DOA: 04/10/2017 PCP: [name], MD   Brief Narrative: [name] is a 66 y.o. male,w hypertension, hyperlipidemia, Dm2, w R BKA, CKD stage3, Pafib, CAD apparently presented due to bleeding from large sacral decubitus ulcer. Pt sent to ED for evaluation. He received 2 units of PRBC to date in addition to debridement. He is currently undergoing hydrotherapy. Levaquin added for leukocytosis in addition to exudative material concerning for pseudomonal infection.   Assessment & Plan:   Principal Problem:   Hyperglycemia Active Problems:   Obesity, Class III, BMI 40-49.9 (morbid obesity) (HCC)   Atrial fibrillation   Below knee amputation status, right (HCC)   Hyponatremia   ARF (acute renal failure) (HCC)   Sacral ulcer S/p debridement on 10/3. Hydrotherapy and dressing changes currently. Afebrile. No erythema. -general surgery recommendations -continue dilaudid prn for hydrotherapy  Leukocytosis Afebrile. Ulcer without evidence of active infection. No wound cultures available. Improved slightly. -continue Levaquin (7 days) for possible infection of ulcer. Will need to adjust for renal function  Diabetes mellitus, type 2 with hyperglycemia Uncontrolled. -continue SSI -continue Lantus 90 units qhs  Hyponatremia Trending up. Asymptomatic.  Anemia Secondary to blood loss. Received 2 units of PRBC on 10/2 -trend  Morbid obesity Body mass index is 45 kg/m.  Atrial fibrillation Rate controlled. Xarelto held secondary to bleeding and debridement. -continue Xarelto as there are no further plans for debridement.  Chronic narcotic use Patient seen by pain management. -continue Oxycodone 20mg  q4 hours prn  Acute kidney injury on CKD 3 Initially improved. Currently on torsemide and creatinine increasing -decrease torsemide from 80mg  BID to 40mg  BID -repeat BMP in AM  Constipation Opiate  induced. Passing gas. -Miralax -Senokot-S   DVT prophylaxis: SCDs, Xarelto Code Status: DNR Family Communication: None at bedside Disposition Plan: Discharge pending wound care   Consultants:   General surgery  Procedures:   Debridement of sacral wound (10/2)  Antimicrobials:  Levaquin (10/5>>    Subjective: No bowel movements. Passing gas.  Objective: Vitals:   04/16/17 2224 04/17/17 0557 04/17/17 1018 04/17/17 1028  BP: 136/74 (!) 134/54 96/67 96/67   Pulse: 89 (!) 108  (!) 102  Resp: 18 17  18   Temp: 98.6 F (37 C) 98.3 F (36.8 C)  98.2 F (36.8 C)  TempSrc: Oral Oral  Oral  SpO2: 100% 100%  100%  Weight:      Height:        Intake/Output Summary (Last 24 hours) at 04/17/17 1141 Last data filed at 04/17/17 1046  Gross per 24 hour  Intake              640 ml  Output              575 ml  Net               65 ml   Filed Weights   04/10/17 2305  Weight: (!) 163.3 kg (360 lb)    Examination:  General exam: Appears calm and comfortable. Morbidly obese Respiratory system: Clear to auscultation bilaterally. Unlabored work of breathing. No wheezing or rales. Cardiovascular: Regular rate and rhythm. Normal S1 and S2. No heart murmurs present. No extra heart sounds Gastrointestinal system: Soft, non-tender, non-distended, no guarding, no rebound, no masses felt Central nervous system: Alert and oriented. No focal neurological deficits. Extremities: No edema. No calf tenderness Skin: No cyanosis. Psychiatry: Judgement and insight appear normal. Mood & affect  appropriate.     Data Reviewed: I have personally reviewed following labs and imaging studies  CBC:  Recent Labs Lab 04/11/17 0001  04/12/17 0414 04/13/17 0216 04/14/17 0243 04/15/17 0325 04/17/17 0214  WBC 15.3*  < > 16.5* 16.1* 16.2* 22.6* 21.3*  NEUTROABS 11.6*  --   --   --   --   --   --   HGB 8.8*  < > 6.8* 8.0* 8.0* 8.7* 8.8*  HCT 29.0*  < > 22.2* 24.9* 25.9* 28.1* 28.4*  MCV 70.2*   < > 69.8* 73.7* 74.6* 75.5* 74.7*  PLT 446*  < > 367 313 308 404* 392  < > = values in this interval not displayed. Basic Metabolic Panel:  Recent Labs Lab 04/12/17 0414 04/13/17 0216 04/14/17 0243 04/15/17 0325 04/17/17 0214  NA 126* 128* 127* 129* 127*  K 3.9 4.0 3.7 4.0 5.3*  CL 84* 88* 88* 89* 86*  CO2 28 27 26 30 28   GLUCOSE 378* 187* 272* 150* 161*  BUN 55* 52* 46* 44* 59*  CREATININE 2.06* 1.56* 1.45* 1.50* 1.85*  CALCIUM 8.6* 8.8* 8.4* 8.9 8.9   GFR: Estimated Creatinine Clearance: 65.3 mL/min (A) (by C-G formula based on SCr of 1.85 mg/dL (H)). Liver Function Tests:  Recent Labs Lab 04/11/17 0428  AST 21  ALT 11*  ALKPHOS 87  BILITOT 1.4*  PROT 7.5  ALBUMIN 2.5*   No results for input(s): LIPASE, AMYLASE in the last 168 hours. No results for input(s): AMMONIA in the last 168 hours. Coagulation Profile: No results for input(s): INR, PROTIME in the last 168 hours. Cardiac Enzymes: No results for input(s): CKTOTAL, CKMB, CKMBINDEX, TROPONINI in the last 168 hours. BNP (last 3 results) No results for input(s): PROBNP in the last 8760 hours. HbA1C: No results for input(s): HGBA1C in the last 72 hours. CBG:  Recent Labs Lab 04/16/17 0755 04/16/17 1226 04/16/17 1812 04/16/17 2223 04/17/17 1026  GLUCAP 377* 283* 183* 112* 424*   Lipid Profile: No results for input(s): CHOL, HDL, LDLCALC, TRIG, CHOLHDL, LDLDIRECT in the last 72 hours. Thyroid Function Tests: No results for input(s): TSH, T4TOTAL, FREET4, T3FREE, THYROIDAB in the last 72 hours. Anemia Panel: No results for input(s): VITAMINB12, FOLATE, FERRITIN, TIBC, IRON, RETICCTPCT in the last 72 hours. Sepsis Labs: No results for input(s): PROCALCITON, LATICACIDVEN in the last 168 hours.  Recent Results (from the past 240 hour(s))  MRSA PCR Screening     Status: Abnormal   Collection Time: 04/13/17 11:42 AM  Result Value Ref Range Status   MRSA by PCR POSITIVE (A) NEGATIVE Final    Comment:         The GeneXpert MRSA Assay (FDA approved for NASAL specimens only), is one component of a comprehensive MRSA colonization surveillance program. It is not intended to diagnose MRSA infection nor to guide or monitor treatment for MRSA infections. RESULT CALLED TO, READ BACK BY AND VERIFIED WITH: A. Mabe RN 13:10 04/13/17 ([email or web address])          Radiology Studies: No results found.      Scheduled Meds: . atorvastatin  80 mg Oral QHS  . Chlorhexidine Gluconate Cloth  6 each Topical Q0600  . collagenase   Topical Daily  . diltiazem  240 mg Oral Daily  . diphenhydrAMINE  25 mg Oral Once  . docusate sodium  100 mg Oral Daily  . feeding supplement (PRO-STAT SUGAR FREE 64)  30 mL Oral BID  . gabapentin  600 mg Oral BID  .  insulin aspart  0-20 Units Subcutaneous TID WC  . insulin aspart  0-5 Units Subcutaneous QHS  . insulin aspart  20 Units Subcutaneous TID WC  . insulin glargine  80 Units Subcutaneous Daily   And  . insulin glargine  10 Units Subcutaneous QHS  . levofloxacin  750 mg Oral Daily  . lidocaine   Topical Once  . metoprolol tartrate  50 mg Oral BID  . multivitamin with minerals  1 tablet Oral Daily  . mupirocin ointment  1 application Nasal BID  . polyethylene glycol  17 g Oral BID  . potassium chloride SA  40 mEq Oral BID  . rivaroxaban  20 mg Oral Q supper  . senna-docusate  1 tablet Oral BID  . spironolactone  25 mg Oral Daily  . torsemide  40 mg Oral BID   Continuous Infusions:   LOS: 5 days     Jacquelin Hawking, MD [name] 04/17/2017, 11:41 AM Pager: [phone]  If 7PM-7AM, please contact night-coverage [email or web address] Password [email or web address] 04/17/2017, 11:41 AM

## 2017-04-17 NOTE — Progress Notes (Signed)
Pt asked for RN to page MD for something stronger for pain. RN has paged. Awaiting further orders.

## 2017-04-17 NOTE — Progress Notes (Signed)
CRITICAL VALUE ALERT  Critical Value:  404  Date & Time Notied:  04/17/17 1301  Provider Notified: Caleb Popp  Orders Received/Actions taken: Orders given and followed. Will continue to monitor.

## 2017-04-17 NOTE — Progress Notes (Signed)
Pt refused morning dressing change. Will notify on coming nurse to attempt to change later today.

## 2017-04-17 NOTE — Progress Notes (Signed)
CRITICAL VALUE ALERT  Critical Value:  Blood sugar 424  Date & Time Notied:  04/17/17 1035  Provider Notified: Caleb Popp  Orders Received/Actions taken: Orders given and followed.

## 2017-04-18 LAB — BASIC METABOLIC PANEL
Anion gap: 14 (ref 5–15)
Anion gap: 13 (ref 5–15)
BUN: 76 mg/dL — ABNORMAL HIGH (ref 6–20)
BUN: 73 mg/dL — AB (ref 6–20)
CALCIUM: 8.1 mg/dL — AB (ref 8.9–10.3)
CO2: 25 mmol/L (ref 22–32)
CO2: 25 mmol/L (ref 22–32)
CREATININE: 2.13 mg/dL — AB (ref 0.61–1.24)
Calcium: 8.7 mg/dL — ABNORMAL LOW (ref 8.9–10.3)
Chloride: 85 mmol/L — ABNORMAL LOW (ref 101–111)
Chloride: 83 mmol/L — ABNORMAL LOW (ref 101–111)
Creatinine, Ser: 2.25 mg/dL — ABNORMAL HIGH (ref 0.61–1.24)
GFR calc Af Amer: 33 mL/min — ABNORMAL LOW (ref >60)
GFR calc Af Amer: 36 mL/min — ABNORMAL LOW (ref >60)
GFR calc non Af Amer: 29 mL/min — ABNORMAL LOW (ref >60)
GFR, EST NON AFRICAN AMERICAN: 31 mL/min — AB (ref >60)
Glucose, Bld: 178 mg/dL — ABNORMAL HIGH (ref 65–99)
Glucose, Bld: 232 mg/dL — ABNORMAL HIGH (ref 65–99)
POTASSIUM: 4.2 mmol/L (ref 3.5–5.1)
Potassium: 4.6 mmol/L (ref 3.5–5.1)
SODIUM: 121 mmol/L — AB (ref 135–145)
Sodium: 124 mmol/L — ABNORMAL LOW (ref 135–145)

## 2017-04-18 LAB — CBC
HCT: 27.5 % — ABNORMAL LOW (ref 39.0–52.0)
Hemoglobin: 8.5 g/dL — ABNORMAL LOW (ref 13.0–17.0)
MCH: 22.7 pg — ABNORMAL LOW (ref 26.0–34.0)
MCHC: 30.9 g/dL (ref 30.0–36.0)
MCV: 73.5 fL — ABNORMAL LOW (ref 78.0–100.0)
Platelets: 387 10*3/uL (ref 150–400)
RBC: 3.74 MIL/uL — ABNORMAL LOW (ref 4.22–5.81)
RDW: 20.6 % — ABNORMAL HIGH (ref 11.5–15.5)
WBC: 15.6 10*3/uL — ABNORMAL HIGH (ref 4.0–10.5)

## 2017-04-18 LAB — GLUCOSE, CAPILLARY
GLUCOSE-CAPILLARY: 357 mg/dL — AB (ref 65–99)
Glucose-Capillary: 352 mg/dL — ABNORMAL HIGH (ref 65–99)
Glucose-Capillary: 161 mg/dL — ABNORMAL HIGH (ref 65–99)
Glucose-Capillary: 79 mg/dL (ref 65–99)

## 2017-04-18 LAB — OSMOLALITY: Osmolality: 279 mOsm/kg (ref 275–295)

## 2017-04-18 MED ORDER — HYDROMORPHONE HCL 1 MG/ML IJ SOLN
0.5000 mg | Freq: Once | INTRAMUSCULAR | Status: AC
  Administered 2017-04-18: 0.5 mg via INTRAVENOUS

## 2017-04-18 MED ORDER — SODIUM CHLORIDE 0.9 % IV BOLUS (SEPSIS)
500.0000 mL | Freq: Once | INTRAVENOUS | Status: AC
  Administered 2017-04-18: 500 mL via INTRAVENOUS

## 2017-04-18 NOTE — Progress Notes (Addendum)
PROGRESS NOTE    [name]  XIP:[id number] DOB: 1951-06-06 DOA: 04/10/2017 PCP: [name], MD   Brief Narrative: [name] is a 66 y.o. male,w hypertension, hyperlipidemia, Dm2, w R BKA, CKD stage3, Pafib, CAD apparently presented due to bleeding from large sacral decubitus ulcer. Pt sent to ED for evaluation. He received 2 units of PRBC to date in addition to debridement. He is currently undergoing hydrotherapy. Levaquin added for leukocytosis in addition to exudative material concerning for pseudomonal infection.   Assessment & Plan:   Principal Problem:   Hyperglycemia Active Problems:   Obesity, Class III, BMI 40-49.9 (morbid obesity) (HCC)   Atrial fibrillation   Below knee amputation status, right (HCC)   Hyponatremia   ARF (acute renal failure) (HCC)   Sacral ulcer S/p debridement on 10/3. Hydrotherapy and dressing changes currently. Afebrile. No erythema. -general surgery recommendations -continue dilaudid prn for hydrotherapy  Leukocytosis Afebrile. Ulcer without evidence of active infection. No wound cultures available. Improved. -continue Levaquin (7 days) for possible infection of ulcer. Will need to adjust for renal function  Diabetes mellitus, type 2 with hyperglycemia Uncontrolled. -continue SSI -continue Lantus 80 units qAM and 10 units qhs  Hyponatremia Worsened. Likely secondary to hypovolemia in the setting of overdiuresis -repeat BMP  Anemia Secondary to blood loss. Received 2 units of PRBC on 10/2 -trend  Morbid obesity Body mass index is 45 kg/m.  Atrial fibrillation Rate controlled. Xarelto held secondary to bleeding and debridement. -continue Xarelto as there are no further plans for debridement.  Chronic narcotic use Patient seen by pain management. -continue Oxycodone 20mg  q4 hours prn  Acute kidney injury on CKD 3 Worsening likely secondary to overdiuresis -discontinue torsemide -repeat BMP in PM - NS  bolus  Constipation Opiate induced. Passing gas. -Miralax -Senokot-S   DVT prophylaxis: SCDs, Xarelto Code Status: DNR Family Communication: None at bedside Disposition Plan: Discharge pending wound care   Consultants:   General surgery  Procedures:   Debridement of sacral wound (10/2)  Antimicrobials:  Levaquin (10/5>>    Subjective: Pain with hydrotherapy  Objective: Vitals:   04/17/17 2202 04/17/17 2223 04/18/17 0303 04/18/17 0459  BP: (!) 107/51 (!) 107/51  (!) 92/55  Pulse: 76 76  79  Resp:  18  18  Temp:  98.6 F (37 C)  98.1 F (36.7 C)  TempSrc:  Oral  Oral  SpO2:  100%  100%  Weight:  (!) 140.6 kg (310 lb) (!) 140.6 kg (310 lb)   Height:        Intake/Output Summary (Last 24 hours) at 04/18/17 0910 Last data filed at 04/18/17 0730  Gross per 24 hour  Intake              462 ml  Output             1575 ml  Net            -1113 ml   Filed Weights   04/10/17 2305 04/17/17 2223 04/18/17 0303  Weight: (!) 163.3 kg (360 lb) (!) 140.6 kg (310 lb) (!) 140.6 kg (310 lb)    Examination:  General exam: Appears calm and comfortable. Morbidly obese Respiratory system: Clear to auscultation bilaterally. Unlabored work of breathing. No wheezing or rales. Cardiovascular: Regular rate and rhythm. Normal S1 and S2. No heart murmurs present. No extra heart sounds Gastrointestinal system: Soft, non-tender, non-distended, no guarding, no rebound, no masses felt Central nervous system: Alert and oriented. No focal neurological  deficits. Extremities: No edema. No calf tenderness Skin: No cyanosis. Psychiatry: Judgement and insight appear normal. Mood & affect appropriate.     Data Reviewed: I have personally reviewed following labs and imaging studies  CBC:  Recent Labs Lab 04/13/17 0216 04/14/17 0243 04/15/17 0325 04/17/17 0214 04/18/17 0320  WBC 16.1* 16.2* 22.6* 21.3* 15.6*  HGB 8.0* 8.0* 8.7* 8.8* 8.5*  HCT 24.9* 25.9* 28.1* 28.4* 27.5*  MCV  73.7* 74.6* 75.5* 74.7* 73.5*  PLT 313 308 404* 392 387   Basic Metabolic Panel:  Recent Labs Lab 04/13/17 0216 04/14/17 0243 04/15/17 0325 04/17/17 0214 04/18/17 0320  NA 128* 127* 129* 127* 124*  K 4.0 3.7 4.0 5.3* 4.6  CL 88* 88* 89* 86* 85*  CO2 GLUCOSE 187* 272* 150* 161* 178*  BUN 52* 46* 44* 59* 76*  CREATININE 1.56* 1.45* 1.50* 1.85* 2.25*  CALCIUM 8.8* 8.4* 8.9 8.9 8.7*   GFR: Estimated Creatinine Clearance: 49.5 mL/min (A) (by C-G formula based on SCr of 2.25 mg/dL (H)). Liver Function Tests: No results for input(s): AST, ALT, ALKPHOS, BILITOT, PROT, ALBUMIN in the last 168 hours. No results for input(s): LIPASE, AMYLASE in the last 168 hours. No results for input(s): AMMONIA in the last 168 hours. Coagulation Profile: No results for input(s): INR, PROTIME in the last 168 hours. Cardiac Enzymes: No results for input(s): CKTOTAL, CKMB, CKMBINDEX, TROPONINI in the last 168 hours. BNP (last 3 results) No results for input(s): PROBNP in the last 8760 hours. HbA1C: No results for input(s): HGBA1C in the last 72 hours. CBG:  Recent Labs Lab 04/17/17 1026 04/17/17 1300 04/17/17 1744 04/17/17 2158 04/18/17 0825  GLUCAP 424* 404* 293* 172* 357*   Lipid Profile: No results for input(s): CHOL, HDL, LDLCALC, TRIG, CHOLHDL, LDLDIRECT in the last 72 hours. Thyroid Function Tests: No results for input(s): TSH, T4TOTAL, FREET4, T3FREE, THYROIDAB in the last 72 hours. Anemia Panel: No results for input(s): VITAMINB12, FOLATE, FERRITIN, TIBC, IRON, RETICCTPCT in the last 72 hours. Sepsis Labs: No results for input(s): PROCALCITON, LATICACIDVEN in the last 168 hours.  Recent Results (from the past 240 hour(s))  MRSA PCR Screening     Status: Abnormal   Collection Time: 04/13/17 11:42 AM  Result Value Ref Range Status   MRSA by PCR POSITIVE (A) NEGATIVE Final    Comment:        The GeneXpert MRSA Assay (FDA approved for NASAL specimens only), is  one component of a comprehensive MRSA colonization surveillance program. It is not intended to diagnose MRSA infection nor to guide or monitor treatment for MRSA infections. RESULT CALLED TO, READ BACK BY AND VERIFIED WITH: A. Mabe RN 13:10 04/13/17 ([email or web address])          Radiology Studies: No results found.      Scheduled Meds: . atorvastatin  80 mg Oral QHS  . Chlorhexidine Gluconate Cloth  6 each Topical Q0600  . collagenase   Topical Daily  . diltiazem  240 mg Oral Daily  . diphenhydrAMINE  25 mg Oral Once  . docusate sodium  100 mg Oral Daily  . feeding supplement (PRO-STAT SUGAR FREE 64)  30 mL Oral BID  . gabapentin  600 mg Oral BID  . insulin aspart  0-20 Units Subcutaneous TID WC  . insulin aspart  0-5 Units Subcutaneous QHS  . insulin aspart  20 Units Subcutaneous TID WC  . insulin glargine  80 Units Subcutaneous Daily   And  . insulin  glargine  10 Units Subcutaneous QHS  . levofloxacin  750 mg Oral Daily  . lidocaine   Topical Once  . metoprolol tartrate  50 mg Oral BID  . multivitamin with minerals  1 tablet Oral Daily  . polyethylene glycol  17 g Oral BID  . rivaroxaban  20 mg Oral Q supper  . senna-docusate  1 tablet Oral BID  . spironolactone  25 mg Oral Daily   Continuous Infusions:   LOS: 6 days     Jacquelin Hawking, MD [name] 04/18/2017, 9:10 AM Pager: [phone]  If 7PM-7AM, please contact night-coverage [email or web address] Password [email or web address] 04/18/2017, 9:10 AM

## 2017-04-18 NOTE — Progress Notes (Signed)
Inpatient Diabetes Program Recommendations  AACE/ADA: New Consensus Statement on Inpatient Glycemic Control (2015)  Target Ranges:  Prepandial:   less than 140 mg/dL      Peak postprandial:   less than 180 mg/dL (1-2 hours)      Critically ill patients:  140 - 180 mg/dL   Results for [name], [name] (MRN [id number]) as of 04/18/2017 09:48  Ref. Range 04/16/2017 22:23 04/17/2017 10:26 04/17/2017 13:00 04/17/2017 17:44 04/17/2017 21:58 04/18/2017 08:25  Glucose-Capillary Latest Ref Range: 65 - 99 mg/dL 884 (H) 166 (H) 063 (H) 293 (H) 172 (H) 357 (H)   Review of Glycemic Control  Diabetes history: DM2 Outpatient Diabetes medications: Toujeo 70 units QAM, Novolog 40 units TID with meals Current orders for Inpatient glycemic control: Lantus 80 units daily, Lantus 10 units QHS, Novolog 20 units TID with meals, Novolog 0-20 units TID with meals, Novolog 0-5 units QHS  Inpatient Diabetes Program Recommendations:  Insulin - Basal: Noted in reviewing chart, no bedtime Lantus was ordered for 04/16/17 and glucose 424 mg/dl on 07/18/99 at 09:32 am. Patient received Lantus 90 units @ 10:20 am and Lantus 10 units @ 22:11 on 04/17/17.  Please consider increasing Lantus back up to 90 units QAM and bedtime Lantus to 15 units QHS.  Insulin - Meal Coverage: Please consider increasing meal coverage to Novolog 30 units TID with meals.  Thanks, Orlando Penner, RN, MSN, CDE Diabetes Coordinator Inpatient Diabetes Program [phone] (Team Pager from 8am to 5pm)

## 2017-04-18 NOTE — Progress Notes (Signed)
Physical Therapy Wound Treatment Patient Details  Name: AMADO ANDAL MRN: [id number] Date of Birth: 11/12/1950  Today's Date: 04/18/2017 Time: 6195-0932 Time Calculation (min): 59 min  Subjective  Subjective: "I'm sorry I was grouchy earlier" Patient and Family Stated Goals: Heal wound  Pain Score: Pain Score: 7   Wound Assessment  Pressure Injury 03/23/17 Unstageable - Full thickness tissue loss in which the base of the ulcer is covered by slough (yellow, tan, gray, green or brown) and/or eschar (tan, brown or black) in the wound bed. WOC updated 04/13/17 unstageable (Active)  Dressing Type ABD;Barrier Film (skin prep);Gauze (Comment);Moist to dry 04/18/2017  1:00 PM  Dressing Clean;Dry;Intact 04/18/2017  1:00 PM  Dressing Change Frequency Daily 04/18/2017  1:00 PM  State of Healing Eschar 04/18/2017  1:00 PM  Site / Wound Assessment Black;Brown;Painful;Red;Yellow 04/18/2017  1:00 PM  % Wound base Red or Granulating 10% 04/18/2017  1:00 PM  % Wound base Yellow/Fibrinous Exudate 40% 04/18/2017  1:00 PM  % Wound base Black/Eschar 50% 04/18/2017  1:00 PM  % Wound base Other/Granulation Tissue (Comment) 0% 04/18/2017  1:00 PM  Peri-wound Assessment Pink 04/18/2017  1:00 PM  Wound Length (cm) 12.5 cm 04/14/2017  2:38 PM  Wound Width (cm) 12.5 cm 04/14/2017  2:38 PM  Wound Depth (cm) 2 cm 04/14/2017  2:38 PM  Wound Surface Area (cm^2) 156.25 cm^2 04/14/2017  2:38 PM  Wound Volume (cm^3) 312.5 cm^3 04/14/2017  2:38 PM  Margins Unattached edges (unapproximated) 04/18/2017  1:00 PM  Drainage Amount Moderate 04/18/2017  1:00 PM  Drainage Description Serosanguineous 04/18/2017  1:00 PM  Treatment Debridement (Selective);Hydrotherapy (Pulse lavage);Packing (Saline gauze) 04/18/2017  1:00 PM   Santyl applied to wound bed prior to applying dressing.    Hydrotherapy Pulsed lavage therapy - wound location: sacral Pulsed Lavage with Suction (psi): 12 psi Pulsed Lavage with Suction - Normal Saline Used: 1000  mL Pulsed Lavage Tip: Tip with splash shield Selective Debridement Selective Debridement - Location: sacral Selective Debridement - Tools Used: Forceps;Scalpel;Scissors Selective Debridement - Tissue Removed: slough, eschar and necrosing adipose   Wound Assessment and Plan  Wound Therapy - Assess/Plan/Recommendations Wound Therapy - Clinical Statement: pt could benefit from hydro for PLS to cleanse soften and decrease bacterial load, to prep for selective debridement. Pt has been unable to tolerate the pain of debridement in past sessions, however with break through pain meds and topical lidocaine pt able to tolerate bedside debridement this session. Wound Therapy - Functional Problem List: pt unable to spend any significant time in sitting due to this wound and  become progressively less mobile. Factors Delaying/Impairing Wound Healing: Diabetes Mellitus;Immobility;Multiple medical problems Hydrotherapy Plan: Debridement;Dressing change;Patient/family education;Pulsatile lavage with suction Wound Therapy - Frequency: 6X / week Wound Therapy - Current Recommendations: PT;WOC nurse Wound Therapy - Follow Up Recommendations: Skilled nursing facility Wound Plan: see above  Wound Therapy Goals- Improve the function of patient's integumentary system by progressing the wound(s) through the phases of wound healing (inflammation - proliferation - remodeling) by: Decrease Necrotic Tissue to: 50 Decrease Necrotic Tissue - Progress: Progressing toward goal Increase Granulation Tissue to: 50% Increase Granulation Tissue - Progress: Progressing toward goal Improve Drainage Characteristics: Min;Serous Improve Drainage Characteristics - Progress: Progressing toward goal Goals/treatment plan/discharge plan were made with and agreed upon by patient/family: Yes Time For Goal Achievement: 7 days Wound Therapy - Potential for Goals: Good  Goals will be updated until maximal potential achieved or discharge  criteria met.  Discharge criteria: when goals achieved,  discharge from hospital, MD decision/surgical intervention, no progress towards goals, refusal/missing three consecutive treatments without notification or medical reason.  GP     [name] 04/18/2017, 2:03 PM   Rolinda Roan, PT, DPT Acute Rehabilitation Services Pager: [phone]

## 2017-04-18 NOTE — Progress Notes (Signed)
Tele called RN to state that this pt had had some wide QRS. Pt assessed and is asleep at this time. Will continue to monitor and alert MD with any issues.

## 2017-04-18 NOTE — Progress Notes (Signed)
[facility] Progress Note     Subjective: CC: sacral decubitus ulcer Patient undergoing hydrotherapy. Complaining of pain with hydrotherapy, feels like the pain medication he is getting is not the right dose.   Objective: Vital signs in last 24 hours: Temp:  [98.1 F (36.7 C)-98.6 F (37 C)] 98.1 F (36.7 C) (10/08 0459) Pulse Rate:  [76-102] 79 (10/08 0459) Resp:  [18] 18 (10/08 0459) BP: (92-107)/(51-67) 92/55 (10/08 0459) SpO2:  [100 %] 100 % (10/08 0459) Weight:  [140.6 kg (310 lb)] 140.6 kg (310 lb) (10/08 0303) Last BM Date: 04/18/17  Intake/Output from previous day: 10/07 0701 - 10/08 0700 In: 462 [P.O.:462] Out: 1075 [Urine:1075] Intake/Output this shift: Total I/O In: -  Out: 500 [Urine:500]  PE: Gen:  Alert, NAD Pulm:  Normal effort Sacrum:      Lab Results:   Recent Labs  04/17/17 0214 04/18/17 0320  WBC 21.3* 15.6*  HGB 8.8* 8.5*  HCT 28.4* 27.5*  PLT 392 387   BMET  Recent Labs  04/17/17 0214 04/18/17 0320  NA 127* 124*  K 5.3* 4.6  CL 86* 85*  CO2 28 25  GLUCOSE 161* 178*  BUN 59* 76*  CREATININE 1.85* 2.25*  CALCIUM 8.9 8.7*   PT/INR No results for input(s): LABPROT, INR in the last 72 hours. CMP     Component Value Date/Time   NA 124 (L) 04/18/2017 0320   NA 136 (A) 03/22/2017   K 4.6 04/18/2017 0320   CL 85 (L) 04/18/2017 0320   CO2 25 04/18/2017 0320   GLUCOSE 178 (H) 04/18/2017 0320   BUN 76 (H) 04/18/2017 0320   BUN 34 (A) 03/22/2017   CREATININE 2.25 (H) 04/18/2017 0320   CREATININE 1.57 (H) 08/06/2016 1438   CALCIUM 8.7 (L) 04/18/2017 0320   PROT 7.5 04/11/2017 0428   ALBUMIN 2.5 (L) 04/11/2017 0428   AST 21 04/11/2017 0428   ALT 11 (L) 04/11/2017 0428   ALKPHOS 87 04/11/2017 0428   BILITOT 1.4 (H) 04/11/2017 0428   GFRNONAA 29 (L) 04/18/2017 0320   GFRNONAA 46 (L) 08/06/2016 1438   GFRAA 33 (L) 04/18/2017 0320   GFRAA 53 (L) 08/06/2016 1438    Anti-infectives: Anti-infectives    Start      Dose/Rate Route Frequency Ordered Stop   04/15/17 1315  levofloxacin (LEVAQUIN) tablet 750 mg     750 mg Oral Daily 04/15/17 1310 04/20/17 0959       Assessment/Plan Sacral decubitus ulcer - Continue hydrotherapy and santyl. - moderate necrotic tissue - will discuss with MD whether to continue hydrotherapy and bedside debridement vs debridement in the OR   LOS: 6 days    Wells Guiles , [facility] 04/18/2017, 8:56 AM Pager: [phone] Consults: [phone] Mon-Fri 7:00 am-4:30 pm Sat-Sun 7:00 am-11:30 am

## 2017-04-18 NOTE — Care Management Important Message (Signed)
Important Message  Patient Details  Name: [name] MRN: [id number] Date of Birth: [birth year]   Medicare Important Message Given:  Yes    [name], [name], RN 04/18/2017, 12:13 PM

## 2017-04-19 LAB — BASIC METABOLIC PANEL
ANION GAP: 12 (ref 5–15)
ANION GAP: 13 (ref 5–15)
ANION GAP: 13 (ref 5–15)
Anion gap: 14 (ref 5–15)
BUN: 69 mg/dL — AB (ref 6–20)
BUN: 65 mg/dL — AB (ref 6–20)
BUN: 63 mg/dL — ABNORMAL HIGH (ref 6–20)
BUN: 63 mg/dL — ABNORMAL HIGH (ref 6–20)
CALCIUM: 8.2 mg/dL — AB (ref 8.9–10.3)
CALCIUM: 7.8 mg/dL — AB (ref 8.9–10.3)
CALCIUM: 8 mg/dL — AB (ref 8.9–10.3)
CHLORIDE: 83 mmol/L — AB (ref 101–111)
CHLORIDE: 81 mmol/L — AB (ref 101–111)
CO2: 24 mmol/L (ref 22–32)
CO2: 25 mmol/L (ref 22–32)
CO2: 24 mmol/L (ref 22–32)
CO2: 24 mmol/L (ref 22–32)
CREATININE: 2.08 mg/dL — AB (ref 0.61–1.24)
Calcium: 8.3 mg/dL — ABNORMAL LOW (ref 8.9–10.3)
Chloride: 82 mmol/L — ABNORMAL LOW (ref 101–111)
Chloride: 80 mmol/L — ABNORMAL LOW (ref 101–111)
Creatinine, Ser: 1.91 mg/dL — ABNORMAL HIGH (ref 0.61–1.24)
Creatinine, Ser: 1.72 mg/dL — ABNORMAL HIGH (ref 0.61–1.24)
Creatinine, Ser: 1.81 mg/dL — ABNORMAL HIGH (ref 0.61–1.24)
GFR calc Af Amer: 37 mL/min — ABNORMAL LOW (ref >60)
GFR calc Af Amer: 41 mL/min — ABNORMAL LOW (ref >60)
GFR calc Af Amer: 46 mL/min — ABNORMAL LOW (ref >60)
GFR calc Af Amer: 44 mL/min — ABNORMAL LOW (ref >60)
GFR calc non Af Amer: 32 mL/min — ABNORMAL LOW (ref >60)
GFR, EST NON AFRICAN AMERICAN: 35 mL/min — AB (ref >60)
GFR, EST NON AFRICAN AMERICAN: 40 mL/min — AB (ref >60)
GFR, EST NON AFRICAN AMERICAN: 38 mL/min — AB (ref >60)
GLUCOSE: 296 mg/dL — AB (ref 65–99)
GLUCOSE: 178 mg/dL — AB (ref 65–99)
GLUCOSE: 242 mg/dL — AB (ref 65–99)
Glucose, Bld: 213 mg/dL — ABNORMAL HIGH (ref 65–99)
POTASSIUM: 4.3 mmol/L (ref 3.5–5.1)
POTASSIUM: 4.2 mmol/L (ref 3.5–5.1)
Potassium: 3.7 mmol/L (ref 3.5–5.1)
Potassium: 4.3 mmol/L (ref 3.5–5.1)
SODIUM: 119 mmol/L — AB (ref 135–145)
SODIUM: 117 mmol/L — AB (ref 135–145)
Sodium: 121 mmol/L — ABNORMAL LOW (ref 135–145)
Sodium: 118 mmol/L — CL (ref 135–145)

## 2017-04-19 LAB — GLUCOSE, CAPILLARY
GLUCOSE-CAPILLARY: 317 mg/dL — AB (ref 65–99)
GLUCOSE-CAPILLARY: 281 mg/dL — AB (ref 65–99)
GLUCOSE-CAPILLARY: 185 mg/dL — AB (ref 65–99)
Glucose-Capillary: 229 mg/dL — ABNORMAL HIGH (ref 65–99)

## 2017-04-19 LAB — OSMOLALITY, URINE: Osmolality, Ur: 244 mOsm/kg — ABNORMAL LOW (ref 300–900)

## 2017-04-19 LAB — CORTISOL-AM, BLOOD: Cortisol - AM: 13.1 ug/dL (ref 6.7–22.6)

## 2017-04-19 LAB — SODIUM, URINE, RANDOM: Sodium, Ur: <10 mmol/L

## 2017-04-19 MED ORDER — INSULIN GLARGINE 100 UNIT/ML ~~LOC~~ SOLN: 80.0000 [IU] | Freq: Every day | SUBCUTANEOUS | Status: DC

## 2017-04-19 MED ORDER — SODIUM CHLORIDE 0.9 % IV SOLN
INTRAVENOUS | Status: DC
  Administered 2017-04-19: 13:00:00 via INTRAVENOUS
  Administered 2017-04-20: 100 mL/h via INTRAVENOUS
  Administered 2017-04-20 – 2017-04-21 (×2): via INTRAVENOUS
  Administered 2017-04-21: 100 mL/h via INTRAVENOUS

## 2017-04-19 MED ORDER — INSULIN GLARGINE 100 UNIT/ML ~~LOC~~ SOLN
10.0000 [IU] | Freq: Every day | SUBCUTANEOUS | Status: DC
  Administered 2017-04-19: 10 [IU] via SUBCUTANEOUS
  Filled 2017-04-19: qty 0.1

## 2017-04-19 MED ORDER — INSULIN GLARGINE 100 UNIT/ML ~~LOC~~ SOLN: 20.0000 [IU] | Freq: Every day | SUBCUTANEOUS | Status: DC

## 2017-04-19 MED ORDER — HYDROMORPHONE HCL 1 MG/ML IJ SOLN
1.0000 mg | Freq: Every day | INTRAMUSCULAR | Status: DC | PRN
  Administered 2017-04-19 – 2017-04-21 (×3): 1 mg via INTRAVENOUS
  Filled 2017-04-19 (×4): qty 1

## 2017-04-19 MED ORDER — INSULIN GLARGINE 100 UNIT/ML ~~LOC~~ SOLN
80.0000 [IU] | Freq: Every day | SUBCUTANEOUS | Status: DC
  Administered 2017-04-19 – 2017-04-20 (×2): 80 [IU] via SUBCUTANEOUS
  Filled 2017-04-19 (×2): qty 0.8

## 2017-04-19 NOTE — Progress Notes (Signed)
Physical Therapy Wound Treatment Patient Details  Name: [name] MRN: [id number] Date of Birth: Apr 13, 1951  Today's Date: 04/19/2017 Time: 1937-9024 Time Calculation (min): 56 min  Subjective  Patient and Family Stated Goals: Heal wound  Pain Score: Pain Score: 7   Wound Assessment  Pressure Injury 03/23/17 Unstageable - Full thickness tissue loss in which the base of the ulcer is covered by slough (yellow, tan, gray, green or brown) and/or eschar (tan, brown or black) in the wound bed. WOC updated 04/13/17 unstageable (Active)  Dressing Type ABD;Barrier Film (skin prep);Gauze (Comment);Moist to dry 04/19/2017  1:19 PM  Dressing Clean;Dry;Intact 04/19/2017  1:19 PM  Dressing Change Frequency Daily 04/19/2017  1:19 PM  State of Healing Eschar 04/19/2017  1:19 PM  Site / Wound Assessment Black;Brown;Painful;Red;Yellow 04/19/2017  1:19 PM  % Wound base Red or Granulating 15% 04/19/2017  1:19 PM  % Wound base Yellow/Fibrinous Exudate 40% 04/19/2017  1:19 PM  % Wound base Black/Eschar 45% 04/19/2017  1:19 PM  % Wound base Other/Granulation Tissue (Comment) 0% 04/18/2017  1:00 PM  Peri-wound Assessment Pink 04/19/2017  1:19 PM  Wound Length (cm) 12.5 cm 04/14/2017  2:38 PM  Wound Width (cm) 12.5 cm 04/14/2017  2:38 PM  Wound Depth (cm) 2 cm 04/14/2017  2:38 PM  Wound Surface Area (cm^2) 156.25 cm^2 04/14/2017  2:38 PM  Wound Volume (cm^3) 312.5 cm^3 04/14/2017  2:38 PM  Margins Unattached edges (unapproximated) 04/19/2017  1:19 PM  Drainage Amount Moderate 04/19/2017  1:19 PM  Drainage Description Serosanguineous 04/19/2017  1:19 PM  Treatment Cleansed;Hydrotherapy (Pulse lavage);Debridement (Selective);Packing (Saline gauze) 04/19/2017  1:19 PM   Santyl applied to wound bed prior to applying dressing.    Hydrotherapy Pulsed lavage therapy - wound location: sacral Pulsed Lavage with Suction (psi): 12 psi Pulsed Lavage with Suction - Normal Saline Used: 1000 mL Pulsed Lavage Tip: Tip with splash  shield Selective Debridement Selective Debridement - Location: sacral Selective Debridement - Tools Used: Forceps;Scalpel;Scissors Selective Debridement - Tissue Removed: slough, eschar and necrosing adipose   Wound Assessment and Plan  Wound Therapy - Assess/Plan/Recommendations Wound Therapy - Clinical Statement: pt could benefit from hydro for PLS to cleanse soften and decrease bacterial load, to prep for selective debridement. Pt has been unable to tolerate the pain of debridement in past sessions, however with break through pain meds and topical lidocaine pt able to tolerate bedside debridement this session. Wound Therapy - Functional Problem List: pt unable to spend any significant time in sitting due to this wound and  become progressively less mobile. Factors Delaying/Impairing Wound Healing: Diabetes Mellitus;Immobility;Multiple medical problems Hydrotherapy Plan: Debridement;Dressing change;Patient/family education;Pulsatile lavage with suction Wound Therapy - Frequency: 6X / week Wound Therapy - Current Recommendations: PT;WOC nurse Wound Therapy - Follow Up Recommendations: Skilled nursing facility Wound Plan: see above  Wound Therapy Goals- Improve the function of patient's integumentary system by progressing the wound(s) through the phases of wound healing (inflammation - proliferation - remodeling) by: Decrease Necrotic Tissue to: 50 Decrease Necrotic Tissue - Progress: Progressing toward goal Increase Granulation Tissue to: 50% Increase Granulation Tissue - Progress: Progressing toward goal Goals/treatment plan/discharge plan were made with and agreed upon by patient/family: Yes Time For Goal Achievement: 7 days Wound Therapy - Potential for Goals: Good  Goals will be updated until maximal potential achieved or discharge criteria met.  Discharge criteria: when goals achieved, discharge from hospital, MD decision/surgical intervention, no progress towards goals,  refusal/missing three consecutive treatments without notification or medical reason.  GP     Tessie Fass Avigdor Dollar 04/19/2017, 1:22 PM 04/19/2017  Donnella Sham, Corinne [phone]  (pager)

## 2017-04-19 NOTE — Progress Notes (Signed)
CSW continuing to follow for return to SNF when stable- per notes pt still on hydrotherapy- would not be able to return to [facility] if requiring hydrotherapy at DC  Burna Sis, LCSW Clinical Social Worker [phone]

## 2017-04-19 NOTE — Progress Notes (Signed)
PROGRESS NOTE    [name]  VOZ:[id number] DOB: 03-25-1951 DOA: 04/10/2017 PCP: [name], [name]   Brief Narrative: [name] is a 66 y.o. male,w hypertension, hyperlipidemia, Dm2, w R BKA, CKD stage3, Pafib, CAD apparently presented due to bleeding from large sacral decubitus ulcer. Pt sent to ED for evaluation. He received 2 units of PRBC to date in addition to debridement. He is currently undergoing hydrotherapy. Levaquin added for leukocytosis in addition to exudative material concerning for pseudomonal infection.   Assessment & Plan:   Principal Problem:   Hyperglycemia Active Problems:   Obesity, Class III, BMI 40-49.9 (morbid obesity) (HCC)   Atrial fibrillation   Below knee amputation status, right (HCC)   Hyponatremia   ARF (acute renal failure) (HCC)   Sacral ulcer S/p debridement on 10/3. Hydrotherapy and dressing changes currently. Afebrile. No erythema. -general surgery recommendations: may need OR debridement if no improvement with hydrotherapy -continue dilaudid prn for hydrotherapy  Leukocytosis Afebrile. Ulcer without evidence of active infection. No wound cultures available. Improved. -continue Levaquin (7 days) for possible infection of ulcer. Will need to adjust for renal function  Diabetes mellitus, type 2 with hyperglycemia Uncontrolled. Patient refused night time Lantus secondary to lower blood sugar. -continue SSI -continue Lantus 80 units qAM and 10 units qhs  Hyponatremia Stable today Likely secondary to hypovolemia in the setting of overdiuresis. No improvement with IV fluids, however. Serum osmolality within normal range. -repeat BMP q6 hours -urine osmolality/sodium still pending  Anemia Secondary to blood loss. Received 2 units of PRBC on 10/2 -trend  Morbid obesity Body mass index is 45 kg/m.  Atrial fibrillation Rate controlled. Xarelto held secondary to bleeding and debridement. -continue Xarelto as there are no  further plans for debridement.  Chronic narcotic use Patient seen by pain management. -continue Oxycodone 20mg  q4 hours prn  Acute kidney injury on CKD 3 Worsening likely secondary to overdiuresis -discontinue torsemide -repeat BMP in PM - NS bolus  Constipation Opiate induced. Had a bowel movement -Miralax -Senokot-S -titrate down if having diarrhea   DVT prophylaxis: SCDs, Xarelto Code Status: DNR Family Communication: None at bedside Disposition Plan: Discharge pending wound care   Consultants:   General surgery  Procedures:   Debridement of sacral wound (10/2)  Antimicrobials:  Levaquin (10/5>>    Subjective: No concerns. No headache or confusion.  Objective: Vitals:   04/18/17 2036 04/18/17 2204 04/18/17 2232 04/19/17 0518  BP: (!) 107/51  115/64 (!) 104/53  Pulse: 63  62 (!) 53  Resp: 17   18  Temp: 97.7 F (36.5 C)   97.8 F (36.6 C)  TempSrc:      SpO2: 100%   100%  Weight:  (!) 140.2 kg (309 lb)    Height:        Intake/Output Summary (Last 24 hours) at 04/19/17 0857 Last data filed at 04/19/17 4259  Gross per 24 hour  Intake             2560 ml  Output             1275 ml  Net             1285 ml   Filed Weights   04/17/17 2223 04/18/17 0303 04/18/17 2204  Weight: (!) 140.6 kg (310 lb) (!) 140.6 kg (310 lb) (!) 140.2 kg (309 lb)    Examination:  General exam: Appears calm and comfortable. Morbidly obese Respiratory system: Clear to auscultation bilaterally. Unlabored work of  breathing. No wheezing or rales. Cardiovascular: Regular rate and rhythm. Normal S1 and S2. No heart murmurs present. No extra heart sounds Gastrointestinal system: Soft, non-tender, non-distended, no guarding, no rebound, no masses felt Central nervous system: Alert and oriented. No focal neurological deficits. Extremities: No edema. No calf tenderness Skin: No cyanosis. Psychiatry: Judgement and insight appear normal. Mood & affect appropriate.      Data Reviewed: I have personally reviewed following labs and imaging studies  CBC:  Recent Labs Lab 04/13/17 0216 04/14/17 0243 04/15/17 0325 04/17/17 0214 04/18/17 0320  WBC 16.1* 16.2* 22.6* 21.3* 15.6*  HGB 8.0* 8.0* 8.7* 8.8* 8.5*  HCT 24.9* 25.9* 28.1* 28.4* 27.5*  MCV 73.7* 74.6* 75.5* 74.7* 73.5*  PLT 313 308 404* 392 387   Basic Metabolic Panel:  Recent Labs Lab 04/15/17 0325 04/17/17 0214 04/18/17 0320 04/18/17 1424 04/19/17 0317  NA 129* 127* 124* 121* 121*  K 4.0 5.3* 4.6 4.2 4.3  CL 89* 86* 85* 83* 83*  CO2 30 28 25 25 24   GLUCOSE 150* 161* 178* 232* 213*  BUN 44* 59* 76* 73* 69*  CREATININE 1.50* 1.85* 2.25* 2.13* 2.08*  CALCIUM 8.9 8.9 8.7* 8.1* 8.3*   GFR: Estimated Creatinine Clearance: 53.5 mL/min (A) (by C-G formula based on SCr of 2.08 mg/dL (H)). Liver Function Tests: No results for input(s): AST, ALT, ALKPHOS, BILITOT, PROT, ALBUMIN in the last 168 hours. No results for input(s): LIPASE, AMYLASE in the last 168 hours. No results for input(s): AMMONIA in the last 168 hours. Coagulation Profile: No results for input(s): INR, PROTIME in the last 168 hours. Cardiac Enzymes: No results for input(s): CKTOTAL, CKMB, CKMBINDEX, TROPONINI in the last 168 hours. BNP (last 3 results) No results for input(s): PROBNP in the last 8760 hours. HbA1C: No results for input(s): HGBA1C in the last 72 hours. CBG:  Recent Labs Lab 04/18/17 0825 04/18/17 1139 04/18/17 1642 04/18/17 2156 04/19/17 0801  GLUCAP 357* 352* 161* 79 317*   Lipid Profile: No results for input(s): CHOL, HDL, LDLCALC, TRIG, CHOLHDL, LDLDIRECT in the last 72 hours. Thyroid Function Tests: No results for input(s): TSH, T4TOTAL, FREET4, T3FREE, THYROIDAB in the last 72 hours. Anemia Panel: No results for input(s): VITAMINB12, FOLATE, FERRITIN, TIBC, IRON, RETICCTPCT in the last 72 hours. Sepsis Labs: No results for input(s): PROCALCITON, LATICACIDVEN in the last 168  hours.  Recent Results (from the past 240 hour(s))  MRSA PCR Screening     Status: Abnormal   Collection Time: 04/13/17 11:42 AM  Result Value Ref Range Status   MRSA by PCR POSITIVE (A) NEGATIVE Final    Comment:        The GeneXpert MRSA Assay (FDA approved for NASAL specimens only), is one component of a comprehensive MRSA colonization surveillance program. It is not intended to diagnose MRSA infection nor to guide or monitor treatment for MRSA infections. RESULT CALLED TO, READ BACK BY AND VERIFIED WITH: A. Mabe RN 13:10 04/13/17 ([email or web address])          Radiology Studies: No results found.      Scheduled Meds: . atorvastatin  80 mg Oral QHS  . collagenase   Topical Daily  . diltiazem  240 mg Oral Daily  . docusate sodium  100 mg Oral Daily  . feeding supplement (PRO-STAT SUGAR FREE 64)  30 mL Oral BID  . gabapentin  600 mg Oral BID  . insulin aspart  0-20 Units Subcutaneous TID WC  . insulin aspart  0-5 Units Subcutaneous QHS  .  insulin aspart  20 Units Subcutaneous TID WC  . insulin glargine  20 Units Subcutaneous QHS   And  . insulin glargine  80 Units Subcutaneous Daily  . levofloxacin  750 mg Oral Daily  . metoprolol tartrate  50 mg Oral BID  . multivitamin with minerals  1 tablet Oral Daily  . polyethylene glycol  17 g Oral BID  . rivaroxaban  20 mg Oral Q supper  . senna-docusate  1 tablet Oral BID  . spironolactone  25 mg Oral Daily   Continuous Infusions:   LOS: 7 days     Paul Hawking, [name] [name] 04/19/2017, 8:57 AM Pager: [phone]  If 7PM-7AM, please contact night-coverage [email or web address] Password [email or web address] 04/19/2017, 8:57 AM

## 2017-04-19 NOTE — Progress Notes (Signed)
Inpatient Diabetes Program Recommendations  AACE/ADA: New Consensus Statement on Inpatient Glycemic Control (2015)  Target Ranges:  Prepandial:   less than 140 mg/dL      Peak postprandial:   less than 180 mg/dL (1-2 hours)      Critically ill patients:  140 - 180 mg/dL  Results for JOSHUAH, MINELLA (MRN [id number]) as of 04/19/2017 08:22  Ref. Range 04/18/2017 08:25 04/18/2017 11:39 04/18/2017 16:42 04/18/2017 21:56 04/19/2017 08:01  Glucose-Capillary Latest Ref Range: 65 - 99 mg/dL 546 (H)  Novolog 40 units  Lantus 80 untis 352 (H)  Novolog 40 untis 161 (H)  Novolog 24 units 79 317 (H)   Review of Glycemic Control  Diabetes history: DM2 Outpatient Diabetes medications: Lantus 60 units QAM and Lantus 30 units QHS, Novolog 100 units TID with meals Current orders for Inpatient glycemic control: Lantus 80 units daily, Lantus 10 units QHS, Novolog 20 units TID with meals, Novolog 0-20 units TID with meals, Novolog 0-5 units QHS  Inpatient Diabetes Program Recommendations: Insulin - Basal: Note patient refused Lantus 10 units QHS last night. As a result, glucose up to 317 mg/dl this morning. Please increase morning dose of Lantus to 90 units QAM. Insulin - Meal Coverage: Noted glucose down to 79 mg/dl at 56:81 on 27/5/17. Patient reports that he did not each hardly any of his supper last night but patient was given meal coverage for supper last night. NURSING, please be sure patient eats at least 50% of meal before giving meal coverage. MD, please consider increasing meal coverage to Novolog 25 units TID.  NOTE: Spoke with patient over the phone. Patient reports that he did not eat much of his supper at all last night. Noted patient received Novolog 20 units of meal coverage for supper last night. CBG down to 79 mg/dl at 00:17, likely a result of getting meal coverage and not eating much of supper. Inquired about outpatient medications to confirm DM medications in chart. Home medication list Toujeo  70 units QAM and Novolog 40 units TID with meals. However, patient states that he is NOT taking Toujeo he is taking Lantus 60 units QAM and Lantus 30 units QHS and he is taking Novolog 100 units TID with meals. Inquired about bedtime dose of Lantus that was charted as refused. Patient states that at home if his glucose is down to low, he does not take bedtime dose of Lantus. Discussed glycemic control and encouraged patient to take medications as prescribed to improve inpatient glycemic control. Patient verbalized understanding of information discussed and he states he has not questions related to DM at this time.  Thanks, Orlando Penner, RN, MSN, CDE Diabetes Coordinator Inpatient Diabetes Program [phone] (Team Pager from 8am to 5pm)

## 2017-04-19 NOTE — Progress Notes (Addendum)
CRITICAL VALUE ALERT  Critical Value: Na 117  Date & Time Notied:  04/19/17 1152  Provider Notified: Merdis Delay, NP 04/19/17 2353  Orders Received/Actions taken: 0030 orders given

## 2017-04-20 DIAGNOSIS — S31000A Unspecified open wound of lower back and pelvis without penetration into retroperitoneum, initial encounter: Secondary | ICD-10-CM

## 2017-04-20 DIAGNOSIS — I482 Chronic atrial fibrillation: Secondary | ICD-10-CM

## 2017-04-20 DIAGNOSIS — R739 Hyperglycemia, unspecified: Secondary | ICD-10-CM

## 2017-04-20 DIAGNOSIS — E871 Hypo-osmolality and hyponatremia: Secondary | ICD-10-CM

## 2017-04-20 DIAGNOSIS — N179 Acute kidney failure, unspecified: Secondary | ICD-10-CM

## 2017-04-20 DIAGNOSIS — Z89511 Acquired absence of right leg below knee: Secondary | ICD-10-CM

## 2017-04-20 LAB — CBC
HCT: 22.4 % — ABNORMAL LOW (ref 39.0–52.0)
HEMOGLOBIN: 7.1 g/dL — AB (ref 13.0–17.0)
MCH: 23.1 pg — AB (ref 26.0–34.0)
MCHC: 31.7 g/dL (ref 30.0–36.0)
MCV: 73 fL — ABNORMAL LOW (ref 78.0–100.0)
PLATELETS: 356 10*3/uL (ref 150–400)
RBC: 3.07 MIL/uL — ABNORMAL LOW (ref 4.22–5.81)
RDW: 20.6 % — ABNORMAL HIGH (ref 11.5–15.5)
WBC: 14.3 10*3/uL — ABNORMAL HIGH (ref 4.0–10.5)

## 2017-04-20 LAB — BASIC METABOLIC PANEL
ANION GAP: 13 (ref 5–15)
BUN: 59 mg/dL — ABNORMAL HIGH (ref 6–20)
CALCIUM: 7.8 mg/dL — AB (ref 8.9–10.3)
CO2: 23 mmol/L (ref 22–32)
CREATININE: 1.62 mg/dL — AB (ref 0.61–1.24)
Chloride: 81 mmol/L — ABNORMAL LOW (ref 101–111)
GFR, EST AFRICAN AMERICAN: 50 mL/min — AB (ref >60)
GFR, EST NON AFRICAN AMERICAN: 43 mL/min — AB (ref >60)
GLUCOSE: 271 mg/dL — AB (ref 65–99)
Potassium: 4 mmol/L (ref 3.5–5.1)
Sodium: 117 mmol/L — CL (ref 135–145)

## 2017-04-20 LAB — RENAL FUNCTION PANEL
Albumin: 2.2 g/dL — ABNORMAL LOW (ref 3.5–5.0)
Anion gap: 11 (ref 5–15)
BUN: 52 mg/dL — ABNORMAL HIGH (ref 6–20)
CHLORIDE: 88 mmol/L — AB (ref 101–111)
CO2: 23 mmol/L (ref 22–32)
CREATININE: 1.49 mg/dL — AB (ref 0.61–1.24)
Calcium: 8.1 mg/dL — ABNORMAL LOW (ref 8.9–10.3)
GFR, EST AFRICAN AMERICAN: 55 mL/min — AB (ref >60)
GFR, EST NON AFRICAN AMERICAN: 48 mL/min — AB (ref >60)
Glucose, Bld: 99 mg/dL (ref 65–99)
PHOSPHORUS: 2.6 mg/dL (ref 2.5–4.6)
Potassium: 4 mmol/L (ref 3.5–5.1)
Sodium: 122 mmol/L — ABNORMAL LOW (ref 135–145)

## 2017-04-20 LAB — GLUCOSE, CAPILLARY
GLUCOSE-CAPILLARY: 367 mg/dL — AB (ref 65–99)
GLUCOSE-CAPILLARY: 246 mg/dL — AB (ref 65–99)
GLUCOSE-CAPILLARY: 83 mg/dL (ref 65–99)
GLUCOSE-CAPILLARY: 82 mg/dL (ref 65–99)

## 2017-04-20 LAB — CREATININE, URINE, RANDOM: CREATININE, URINE: 33.06 mg/dL

## 2017-04-20 LAB — PREPARE RBC (CROSSMATCH)

## 2017-04-20 LAB — OSMOLALITY, URINE: OSMOLALITY UR: 234 mosm/kg — AB (ref 300–900)

## 2017-04-20 LAB — SODIUM, URINE, RANDOM: Sodium, Ur: <10 mmol/L

## 2017-04-20 LAB — OSMOLALITY: Osmolality: 271 mOsm/kg — ABNORMAL LOW (ref 275–295)

## 2017-04-20 MED ORDER — PRO-STAT SUGAR FREE PO LIQD: 30.0000 mL | Freq: Three times a day (TID) | ORAL | Status: DC

## 2017-04-20 MED ORDER — PRO-STAT SUGAR FREE PO LIQD
30.0000 mL | Freq: Three times a day (TID) | ORAL | Status: DC
  Filled 2017-04-20: qty 30

## 2017-04-20 MED ORDER — THROMBIN 20000 UNITS EX SOLR
20000.0000 [IU] | Freq: Once | CUTANEOUS | Status: AC
  Administered 2017-04-20: 20000 [IU] via TOPICAL
  Filled 2017-04-20: qty 20000

## 2017-04-20 MED ORDER — SODIUM CHLORIDE 0.9 % IV SOLN: Freq: Once | INTRAVENOUS | Status: DC

## 2017-04-20 MED ORDER — INSULIN GLARGINE 100 UNIT/ML ~~LOC~~ SOLN
10.0000 [IU] | Freq: Every day | SUBCUTANEOUS | Status: DC
Start: 2017-04-20 — End: 2017-04-22
  Administered 2017-04-20 – 2017-04-21 (×2): 10 [IU] via SUBCUTANEOUS
  Filled 2017-04-20 (×2): qty 0.1

## 2017-04-20 MED ORDER — HYDROMORPHONE HCL 1 MG/ML IJ SOLN
1.0000 mg | Freq: Once | INTRAMUSCULAR | Status: AC
  Administered 2017-04-20: 1 mg via INTRAVENOUS

## 2017-04-20 MED ORDER — GLUCERNA SHAKE PO LIQD
237.0000 mL | Freq: Three times a day (TID) | ORAL | Status: DC
  Administered 2017-04-20: 237 mL via ORAL

## 2017-04-20 MED ORDER — INSULIN GLARGINE 100 UNIT/ML ~~LOC~~ SOLN
90.0000 [IU] | Freq: Every day | SUBCUTANEOUS | Status: DC
  Administered 2017-04-21: 90 [IU] via SUBCUTANEOUS
  Filled 2017-04-20 (×2): qty 0.9

## 2017-04-20 MED ORDER — INSULIN ASPART 100 UNIT/ML ~~LOC~~ SOLN
25.0000 [IU] | Freq: Three times a day (TID) | SUBCUTANEOUS | Status: DC
  Administered 2017-04-20 – 2017-04-21 (×3): 25 [IU] via SUBCUTANEOUS

## 2017-04-20 NOTE — Progress Notes (Signed)
Inpatient Diabetes Program Recommendations  AACE/ADA: New Consensus Statement on Inpatient Glycemic Control (2015)  Target Ranges:  Prepandial:   less than 140 mg/dL      Peak postprandial:   less than 180 mg/dL (1-2 hours)      Critically ill patients:  140 - 180 mg/dL   Results for TEMPLE, SPORER (MRN [id number]) as of 04/20/2017 11:07  Ref. Range 04/19/2017 08:01 04/19/2017 12:31 04/19/2017 17:00 04/19/2017 21:22 04/20/2017 08:05  Glucose-Capillary Latest Ref Range: 65 - 99 mg/dL 326 (H)  Novolog 35 units  Lantus 80 units @11 :23 281 (H)  Novolog 31 units 185 (H)  Novolog 24 units 229 (H)  Novolog 2 units  Lantus 10 units @ 21:34 367 (H)  Novolog 40 units  Lantus 80 units @9 :56   Review of Glycemic Control  Diabetes history:DM2 Outpatient Diabetes medications: Lantus 60 units QAM and Lantus 30 units QHS, Novolog 100 units TID with meals Current orders for Inpatient glycemic control: Lantus 80 units daily, Lantus 10 units QHS, Novolog 20 units TID with meals, Novolog 0-20 units TID with meals, Novolog 0-5 units QHS  Inpatient Diabetes Program Recommendations: Insulin - Basal:  Please increase morning dose of Lantus to 90 units QAM. Insulin - Meal Coverage: Please consider increasing meal coverage to Novolog 25 units TID.  Thanks, , RN, MSN, CDE Diabetes Coordinator Inpatient Diabetes Program [phone] (Team Pager from 8am to 5pm)

## 2017-04-20 NOTE — Consult Note (Signed)
Referring Provider: No ref. provider found Primary Care Physician:  [name], MD Primary Nephrologist:  Reason for Consultation:   Hyponatremia   HPI: [name] is a 66 y.o. male,w hypertension, hyperlipidemia, Dm2, w R BKA, CKD stage3, Pafib, CAD apparently presented due to bleeding from large sacral decubitus ulcer. Pt sent to ED for evaluation. He received 2 units of PRBC to date in addition to debridement. He is currently undergoing hydrotherapy. Levaquin added for leukocytosis in addition to exudative material concerning for pseudomonal infection.  He was admitted with hyponatremia and a serum sodium 117    Previous sodium 128 10/7 His urine osm is 240 and urine sodium <10   Blood sugars 270 and  Normal cortisol  TSH 0.448   Blood pressure has been borderline low   Significant urine output 1 - 2 L daily   Past Medical History:  Diagnosis Date  . Arthritis    "hands and lower back" (09/19/2014)  . CAD (coronary artery disease)    a. s/p PCI to RCA in 2012. b. prior cath in 01/2014 with elevated L/RH pressures, mild-mod CAD of LAD/LCx with patent RCA, normal EF, c/b CIN/CHF.  [name] Carpal tunnel syndrome, bilateral   . Cellulitis   . Chronic diastolic CHF (congestive heart failure) (HCC)   . Chronic kidney disease (CKD), stage III (moderate) (HCC)   . Chronic lower back pain   . Diabetes (HCC)   . DKA (diabetic ketoacidoses) (HCC) 03/2017  . History of blood transfusion ~ 1954   "related to OR"  . Hyperlipidemia   . Hypertension   . Hypoxia    a. Qualified for home O2 at [facility] in 09/2014.  [name] Lower GI bleed   . Microcytic anemia   . Morbid obesity (HCC)   . Neuropathy   . OSA (obstructive sleep apnea)    "I wear nasal prongs; haven't been using prongs recently" (09/19/2014)  . PAF (paroxysmal atrial fibrillation) (HCC)    TEE DCCV 09/23/2014  . Physical deconditioning   . Pilonidal cyst 1980's; 01/25/2013  . Scrotal abscess   . Type II diabetes mellitus (HCC)      Past Surgical History:  Procedure Laterality Date  . ABDOMINAL SURGERY  ~ 1954   BENIGN TUMOR REMOVED  . AMPUTATION Right 03/23/2017   Procedure: RIGHT BELOW KNEE AMPUTATION;  Surgeon: Nadara Mustard, MD;  Location: [facility] OR;  Service: Orthopedics;  Laterality: Right;  . APPENDECTOMY    . BELOW KNEE LEG AMPUTATION Right 03/23/2017  . CARDIOVERSION  2010   [name] 09/19/2014  . CARDIOVERSION N/A 09/23/2014   Procedure: CARDIOVERSION;  Surgeon: Lewayne Bunting, MD;  Location: [facility] ENDOSCOPY;  Service: Cardiovascular;  Laterality: N/A;  . CARDIOVERSION N/A 12/11/2014   Procedure: CARDIOVERSION;  Surgeon: Thurmon Fair, MD;  Location: [facility] ENDOSCOPY;  Service: Cardiovascular;  Laterality: N/A;  . CATARACT EXTRACTION W/PHACO Right 11/15/2012   Procedure: CATARACT EXTRACTION PHACO AND INTRAOCULAR LENS PLACEMENT (IOC);  Surgeon: Shade Flood, MD;  Location: [facility] OR;  Service: Ophthalmology;  Laterality: Right;  . CATARACT EXTRACTION W/PHACO Left 11/29/2012   Procedure: CATARACT EXTRACTION PHACO AND INTRAOCULAR LENS PLACEMENT (IOC);  Surgeon: Shade Flood, MD;  Location: [facility] OR;  Service: Ophthalmology;  Laterality: Left;  . CORONARY ANGIOPLASTY WITH STENT PLACEMENT  August 2012   RCA DES - [facility]  . DEBRIDEMENT  FOOT Left    debriding diabetic foot ulcers  . EYE SURGERY    . FOREIGN BODY REMOVAL Right 2014  heel,  splinter removal   . LEFT AND RIGHT HEART CATHETERIZATION WITH CORONARY ANGIOGRAM N/A 01/31/2014   Procedure: LEFT AND RIGHT HEART CATHETERIZATION WITH CORONARY ANGIOGRAM;  Surgeon: Micheline Chapman, MD;  Location: [facility] CATH LAB;  Service: Cardiovascular;  Laterality: N/A;  . PARS PLANA VITRECTOMY Left 06/05/2013   Procedure: PARS PLANA VITRECTOMY WITH 23 GAUGE with Endolaser(constellation);  Surgeon: Shade Flood, MD;  Location: [facility] OR;  Service: Ophthalmology;  Laterality: Left;  . PILONIDAL CYST EXCISION N/A 01/08/2013   Procedure: CYST EXCISION PILONIDAL  EXTENSIVE;  Surgeon: Axel Filler, MD;  Location: [facility] OR;  Service: General;  Laterality: N/A;  . PILONIDAL CYST EXCISION  1980's   "in [place]"  . TEE WITHOUT CARDIOVERSION N/A 09/23/2014   Procedure: TRANSESOPHAGEAL ECHOCARDIOGRAM (TEE);  Surgeon: Lewayne Bunting, MD;  Location: [facility] ENDOSCOPY;  Service: Cardiovascular;  Laterality: N/A;  . TONSILLECTOMY      Prior to Admission medications   Medication Sig Start Date End Date Taking? Authorizing Provider  acetaminophen (TYLENOL) 500 MG tablet Take 500 mg by mouth 3 (three) times daily.    Yes [provider]  aspirin EC 81 MG EC tablet Take 1 tablet (81 mg total) by mouth daily. 06/17/16  Yes Richarda Overlie, MD  atorvastatin (LIPITOR) 80 MG tablet Take 1 tablet (80 mg total) by mouth daily. Patient taking differently: Take 80 mg by mouth at bedtime.  05/25/16  Yes Langeland, Dawn T, MD  collagenase (SANTYL) ointment Apply 1 application topically daily.   Yes [provider]  diltiazem (CARDIZEM CD) 240 MG 24 hr capsule Take 1 capsule (240 mg total) by mouth daily. 03/18/17  Yes Calvert Cantor, MD  docusate sodium (COLACE) 100 MG capsule Take 1 capsule (100 mg total) by mouth daily. 01/26/17  Yes [name], MD  gabapentin (NEURONTIN) 300 MG capsule Take 2 capsules (600 mg total) by mouth 2 (two) times daily. 05/25/16  Yes Langeland, Dawn T, MD  hydrocerin (EUCERIN) CREA Apply 1 application topically 3 (three) times daily. legs 05/25/16  Yes Langeland, Dawn T, MD  insulin aspart (NOVOLOG) 100 UNIT/ML injection Inject 100 Units into the skin 3 (three) times daily after meals.    Yes   insulin glargine (LANTUS) 100 UNIT/ML injection Inject 30-60 Units into the skin 2 (two) times daily. Patient reports taking Lantus 60 units QAM and Lantus 30 units QHS   Yes [provider]  metolazone (ZAROXOLYN) 5 MG tablet Take 5mg  twice weekly on Mondays and Fridays. Patient taking differently: Take 5 mg by mouth See admin  instructions. Take 5mg  twice weekly on Mondays and Fridays. 09/06/16  Yes Bensimhon, 11-29-1988, MD  metoprolol tartrate (LOPRESSOR) 50 MG tablet Take 1 tablet (50 mg total) by mouth 2 (two) times daily. 01/27/17  Yes Bevelyn Buckles, MD  Multiple Vitamin (MULTIVITAMIN WITH MINERALS) TABS Take 1 tablet by mouth daily.   Yes [provider]  oxyCODONE (ROXICODONE) 15 MG immediate release tablet Take 1 tablet (15 mg total) by mouth every 4 (four) hours as needed for pain. Hold for Sedation 03/25/17  Yes Quentin Angst, MD  OXYGEN Inhale 2-3 L into the lungs continuous.   Yes [provider]  potassium chloride SA (K-DUR,KLOR-CON) 20 MEQ tablet Take 40 mEq by mouth 2 (two) times daily.   Yes [provider]  spironolactone (ALDACTONE) 25 MG tablet Take 25 mg by mouth daily.   Yes [provider]  torsemide (DEMADEX) 20 MG tablet Take 4 tablets (80 mg  total) by mouth 2 (two) times daily. 06/28/16  Yes Meng, Hao, PA  XARELTO 20 MG TABS tablet TAKE 1 TABLET BY MOUTH EVERY DAY WITH SUPPER 04/05/17  Yes Quentin Angst, MD    Current Facility-Administered Medications  Medication Dose Route Frequency Provider Last Rate Last Dose  . 0.9 %  sodium chloride infusion   Intravenous Continuous Schorr, Roma Kayser, NP 100 mL/hr at 04/20/17 0051    . 0.9 %  sodium chloride infusion   Intravenous Once Sheikh, Kateri Mc Latif, DO      . acetaminophen (TYLENOL) tablet 650 mg  650 mg Oral Q6H PRN Pearson Grippe, MD   650 mg at 04/14/17 1214   Or  . acetaminophen (TYLENOL) suppository 650 mg  650 mg Rectal Q6H PRN Pearson Grippe, MD      . atorvastatin (LIPITOR) tablet 80 mg  80 mg Oral Farrel Demark, MD   80 mg at 04/19/17 2133  . collagenase (SANTYL) ointment   Topical Daily Emelia Loron, MD      . diltiazem [facility] CD) 24 hr capsule 240 mg  240 mg Oral Daily Pearson Grippe, MD   240 mg at 04/20/17 0955  . docusate sodium (COLACE) capsule 100 mg  100 mg Oral Daily Pearson Grippe, MD    100 mg at 04/17/17 1014  . feeding supplement (PRO-STAT SUGAR FREE 64) liquid 30 mL  30 mL Oral BID Marlin Canary U, DO   30 mL at 04/20/17 0956  . gabapentin (NEURONTIN) capsule 600 mg  600 mg Oral BID Pearson Grippe, MD   600 mg at 04/20/17 0956  . HYDROmorphone (DILAUDID) injection 1 mg  1 mg Intravenous Daily PRN Narda Bonds, MD   1 mg at 04/20/17 1022  . insulin aspart (novoLOG) injection 0-20 Units  0-20 Units Subcutaneous TID WC Vann, Jessica U, DO   20 Units at 04/20/17 3382  . insulin aspart (novoLOG) injection 0-5 Units  0-5 Units Subcutaneous QHS Marlin Canary U, DO   2 Units at 04/19/17 2152  . insulin aspart (novoLOG) injection 25 Units  25 Units Subcutaneous TID WC Sheikh, Omair [place], DO      . [START ON 04/21/2017] insulin glargine (LANTUS) injection 90 Units  90 Units Subcutaneous Daily Sheikh, Omair Latif, DO       And  . insulin glargine (LANTUS) injection 10 Units  10 Units Subcutaneous QHS Sheikh, Omair Latif, DO      . lidocaine (LMX) 4 % cream   Topical Daily PRN Emelia Loron, MD   1 application at 04/19/17 1026  . metoprolol tartrate (LOPRESSOR) tablet 50 mg  50 mg Oral BID Marlin Canary U, DO   50 mg at 04/20/17 0955  . multivitamin with minerals tablet 1 tablet  1 tablet Oral Daily Pearson Grippe, MD   1 tablet at 04/20/17 [phone]  . oxyCODONE (Oxy IR/ROXICODONE) immediate release tablet 20 mg  20 mg Oral Q4H PRN Narda Bonds, MD   20 mg at 04/20/17 1022  . polyethylene glycol (MIRALAX / GLYCOLAX) packet 17 g  17 g Oral BID Narda Bonds, MD   17 g at 04/17/17 2159  . rivaroxaban (XARELTO) tablet 20 mg  20 mg Oral Q supper Narda Bonds, MD   20 mg at 04/19/17 1716  . senna-docusate (Senokot-S) tablet 1 tablet  1 tablet Oral BID Narda Bonds, MD   1 tablet at 04/17/17 2201  . silver nitrate applicators applicator 10 Stick  10 Stick Topical  PRN Emelia Loron, MD   10 Stick at 04/19/17 1553  . thrombin spray 20,000 Units  20,000 Units Topical Once Meuth, Brooke  A, PA-C        Allergies as of 04/10/2017  . (No Known Allergies)    Family History  Problem Relation Age of Onset  . Adopted: Yes  . Other Other        PT ADOPTED    Social History   Social History  . Marital status: Single    Spouse name: N/A  . Number of children: Y  . Years of education: N/A   Occupational History  . disabled truck driver    Social History Main Topics  . Smoking status: Former Smoker    Packs/day: 1.00    Years: 20.00    Types: Cigarettes    Quit date: 07/13/1983  . Smokeless tobacco: Never Used  . Alcohol use No  . Drug use: No  . Sexual activity: Not Currently   Other Topics Concern  . Not on file   Social History Narrative   Pt is single.   Lives with sister.   Has children.   Was adopted.   Daily cafffiene-2 cups of coffee and 2 sodas per day          Review of Systems: Gen: Denies any fever, chills, sweats, anorexia, fatigue, weakness, malaise, weight loss, and sleep disorder HEENT: No visual complaints, No history of Retinopathy. Normal external appearance No Epistaxis or Sore throat. No sinusitis.   CV: Denies chest pain, angina, palpitations, syncope, orthopnea, PND, peripheral edema, and claudication. Resp: Denies dyspnea at rest, dyspnea with exercise, cough, sputum, wheezing, coughing up blood, and pleurisy. GI: Denies vomiting blood, jaundice, and fecal incontinence.   Denies dysphagia or odynophagia. GU : Denies urinary burning, blood in urine, urinary frequency, urinary hesitancy, nocturnal urination, and urinary incontinence.  No renal calculi. MS: Denies joint pain, limitation of movement, and swelling, stiffness, low back pain, extremity pain. Denies muscle weakness, cramps, atrophy.  No use of non steroidal antiinflammatory drugs. Derm: Denies rash, itching, dry skin, hives, moles, warts, or unhealing ulcers.  Psych: Denies depression, anxiety, memory loss, suicidal ideation, hallucinations, paranoia, and confusion. Heme:  Denies bruising, bleeding, and enlarged lymph nodes. Neuro: No headache.  No diplopia. No dysarthria.  No dysphasia.  No history of CVA.  No Seizures. No paresthesias.  No weakness. Endocrine No DM.  No Thyroid disease.  No Adrenal disease.  Physical Exam: Vital signs in last 24 hours: Temp:  [97.9 F (36.6 C)-98.2 F (36.8 C)] 98.2 F (36.8 C) (10/10 0900) Pulse Rate:  [66-72] 71 (10/10 0900) Resp:  [12-19] 18 (10/10 0900) BP: (99-112)/(44-69) 107/61 (10/10 0900) SpO2:  [98 %-100 %] 100 % (10/10 0900) Weight:  [310 lb (140.6 kg)] 310 lb (140.6 kg) (10/10 0558) Last BM Date: 04/18/17 General:   Alert,  Well-developed, well-nourished, pleasant and cooperative in NAD Head:  Normocephalic and atraumatic. Eyes:  Sclera clear, no icterus.   Conjunctiva pink. Ears:  Normal auditory acuity. Nose:  No deformity, discharge,  or lesions. Mouth:  No deformity or lesions, dentition normal. Neck:  Supple; no masses or thyromegaly. JVP not elevated Lungs:  Clear throughout to auscultation.   No wheezes, crackles, or rhonchi. No acute distress. Heart:  Regular rate and rhythm; no murmurs, clicks, rubs,  or gallops. Abdomen:  Soft, nontender and nondistended. No masses, hepatosplenomegaly or hernias noted. Normal bowel sounds, without guarding, and without rebound.   Msk:  Symmetrical  without gross deformities. Normal posture. Pulses:  No carotid, renal, femoral bruits. DP and PT symmetrical and equal Extremities:  Without clubbing or edema. Neurologic:  Alert and  oriented x4;  grossly normal neurologically. Skin:  Intact without significant lesions or rashes. Cervical Nodes:  No significant cervical adenopathy. Psych:  Alert and cooperative. Normal mood and affect.  Intake/Output from previous day: 10/09 0701 - 10/10 0700 In: 2861.3 [P.O.:1730; I.V.:1131.3] Out: 2650 [Urine:2650] Intake/Output this shift: Total I/O In: 240 [P.O.:240] Out: 400 [Urine:400]  Lab Results:  Recent Labs   04/18/17 0320 04/20/17 0600  WBC 15.6* 14.3*  HGB 8.5* 7.1*  HCT 27.5* 22.4*  PLT 387 356   BMET  Recent Labs  04/19/17 1645 04/19/17 2228 04/20/17 0600  NA 119* 117* 117*  K 3.7 4.3 4.0  CL 82* 80* 81*  CO2 24 24 23   GLUCOSE 178* 242* 271*  BUN 63* 63* 59*  CREATININE 1.72* 1.81* 1.62*  CALCIUM 7.8* 8.0* 7.8*   LFT No results for input(s): PROT, ALBUMIN, AST, ALT, ALKPHOS, BILITOT, BILIDIR, IBILI in the last 72 hours. PT/INR No results for input(s): LABPROT, INR in the last 72 hours. Hepatitis Panel No results for input(s): HEPBSAG, HCVAB, HEPAIGM, HEPBIGM in the last 72 hours.  Studies/Results: No results found.  Assessment/Plan:  Hyponatremia  Hypovolemic  Would recommend IV fluids and think that normal saline should be fine to use to replete the volume and think 100 cc /hour is adequate   He may have very poor oral solute intake and this will need to be addressed at this time there is nothing that would suggest SIADH or endo/metabolic cause of the hyponatremia  It would be reasonable to follow a serial renal panel for now and will follow up on the sodium later today    LOS: 8 Zarahi Fuerst W @TODAY @12 :04 PM

## 2017-04-20 NOTE — Progress Notes (Signed)
Nutrition Follow-up  DOCUMENTATION CODES:   Morbid obesity  INTERVENTION:   -Increase Pro-Stat 30 mL from BID to TID  -Add Glucerna Shake po TID, each supplement provides 220 kcal and 10 grams of protein  -continue MVI  NUTRITION DIAGNOSIS:   Increased nutrient needs related to wound healing as evidenced by estimated needs.  Being addressed via supplementation  GOAL:   Patient will meet greater than or equal to 90% of their needs  Progressing  MONITOR:   PO intake, Supplement acceptance, Labs, Weight trends, Skin, I & O's  REASON FOR ASSESSMENT:   Consult Wound healing  ASSESSMENT:   [name]  is a 66 y.o. male, w hypertension, hyperlipidemia, Dm2, w R BKA,  CKD stage3, Pafib, CAD apparently presented due to bleeding from large sacral decubitus ulcer  Recorded po intake 75-100% of meals  10/3 Sacral wound debridement completed, remains on hydrotherapy  Pt reports good appetite, recorded po intake 75-100% of meals. Pt verbalizes eating protein foods at each meal (ie. Eggs, Meat, etc). Pt also reports taking Pro-Stat BID and agreeable to increasing frequency  Weight relatively stable since admission per weight encounters, noted non-pitting edema in LE  Hyponatremic, per MD notes likely secondary to hypovolemia in setting of over-diuresis but has not improved on IV fluids  Labs: sodium 117 (trending down), CBGs 185-367, Creatinine 1.62 (improving) Meds: NS at 100 ml/hr, MVI, ss novolog, lantus  Diet Order:  Diet heart healthy/carb modified Room service appropriate? Yes; Fluid consistency: Thin  Skin:  Wound (see comment) (unstageable sacrum, dehisced right stump)  Last BM:  04/18/17  Height:   Ht Readings from Last 1 Encounters:  04/10/17 6\' 3"  (1.905 m)    Weight:   Wt Readings from Last 1 Encounters:  04/20/17 (!) 310 lb (140.6 kg)    Ideal Body Weight:  83.3 kg  BMI:  Body mass index is 38.75 kg/m.  Estimated Nutritional Needs:   Kcal:   2200-2400  Protein:  130-145 grams  Fluid:  >2.2 L  EDUCATION NEEDS:   Education needs addressed  06/20/17 MS, RD, LDN [phone] Pager  [phone] Weekend/On-Call Pager

## 2017-04-20 NOTE — Progress Notes (Signed)
PROGRESS NOTE    [name]  LTJ:[id number] DOB: [birth year] DOA: 04/10/2017 PCP: [name], MD   Brief Narrative:  [name] is a 66 y.o. male,w hypertension, hyperlipidemia, Dm2, w R BKA, CKD stage3, Pafib, CAD apparently presented due to bleeding from large sacral decubitus ulcer. Pt sent to ED for evaluation. He received 2 units of PRBC to date in addition to debridement. He is currently undergoing hydrotherapy. Levaquin added for leukocytosis in addition to exudative material concerning for pseudomonal infection. PT continuing Hydrotherapy and General Surgery would not recommend debridement in OR. Hospitalization complicated by Hyponatremia and Acute Blood Loss Anemia.   Assessment & Plan:   Principal Problem:   Hyperglycemia Active Problems:   Obesity, Class III, BMI 40-49.9 (morbid obesity) (HCC)   Atrial fibrillation   Below knee amputation status, right (HCC)   Hyponatremia   ARF (acute renal failure) (HCC)  Large Un-stagable Sacral ulcer -S/p debridement on 10/3. Hydrotherapy and dressing changes currently. Afebrile. No erythema. -General surgery recommendations: Would not recommend Debridement in OR per note -Continue dilaudid prn for hydrotherapy  Leukocytosis, improved slightly -Afebrile. Ulcer without evidence of active infection. No wound cultures available. Improved. -Continue Levaquin (7 days) for possible infection of ulcer. Will need to adjust for renal function -Likely also from pain  Diabetes mellitus, type 2 with hyperglycemia -Uncontrolled. Patient refused night time Lantus secondary to lower blood sugar. -continue SSI  -continue Lantus 90 units qAM and 10 units qhs  Hypovolemic Hyponatremia -Likely secondary to hypovolemia in the setting of overdiuresis. No improvement with IV fluids, however. Serum osmolality within normal range. -Nephrology consulted for further recc's -Recommending Continuing IVF with NS 100 mL/hr  Acute Blood  Loss Anemia -Received 2 units of PRBC on 10/2 -Hb/Hct dropped to 7.1/22.4 today -Transfuse 2 more units of pRBC's -Repeat CBC in AM   Morbid obesity -Body mass index is 45 kg/m. -Weight Loss Counseling given  Atrial Fibrillation -Rate controlled. Xarelto was initially held secondary to bleeding and debridement. -Continue Xarelto as there are no further plans for debridement per General Surgery .  Chronic Narcotic Use -Patient seen by pain management. -continue Oxycodone 20mg  q4 hours prn  Acute kidney injury on CKD 3 -Worsened likely secondary to overdiuresis -Discontinue Torsemide -C/w IVF with NS at 100 mL/hr -Repeat CMP in AM   Constipation -Opiate induced. Had a bowel movement -Miralax -Senokot-S -Titrate down if having diarrhea  DVT prophylaxis: Anticoagulated with Xarelto Code Status: DO NOT RESUSCITATE Family Communication: No Family present at bedside Disposition Plan: Remain Inpatient for continued Hydrotherapy   Consultants:   General Surgery  Nephrology  Diabetes Education Coordinator   Procedures:   Hydrotherapy   Antimicrobials:  Anti-infectives    Start     Dose/Rate Route Frequency Ordered Stop   04/15/17 1315  levofloxacin (LEVAQUIN) tablet 750 mg     750 mg Oral Daily 04/15/17 1310 04/19/17 1122     Subjective: Seen and examined and was in severe Pain from sacrum. No SOB. States his wound was still oozing. No other concerns at this time.   Objective: Vitals:   04/20/17 1540 04/20/17 1620 04/20/17 1741 04/20/17 1825  BP: (!) 98/50 (!) 100/46 107/62 (!) 99/50  Pulse:  85 63 80  Resp: 16 16  16   Temp: 100 F (37.8 C)  98.3 F (36.8 C) 98.5 F (36.9 C)  TempSrc: Oral  Oral Oral  SpO2: 96% 96% 100%   Weight:      Height:  Intake/Output Summary (Last 24 hours) at 04/20/17 1914 Last data filed at 04/20/17 1741  Gross per 24 hour  Intake             2965 ml  Output             1950 ml  Net             1015 ml    Filed Weights   04/18/17 0303 04/18/17 2204 04/20/17 0558  Weight: (!) 140.6 kg (310 lb) (!) 140.2 kg (309 lb) (!) 140.6 kg (310 lb)   Examination: Physical Exam:  Constitutional: WN/WD morbidly obese Caucasian Male in who appears uncomfortable  Eyes: Lids and conjunctivae normal, sclerae anicteric  ENMT: External Ears, Nose appear normal. Grossly normal hearing. Mucous membranes are moist. Neck: Appears normal, supple, no cervical masses, normal ROM, no appreciable thyromegaly, no JVD Respiratory: Diminished to auscultation bilaterally, no wheezing, rales, rhonchi or crackles. Normal respiratory effort and patient is not tachypenic. No accessory muscle use.  Cardiovascular: RRR, no murmurs / rubs / gallops. S1 and S2 auscultated. No extremity edema.   Abdomen: Soft, non-tender, Distended due to body habitus. Bowel sounds positive. Has a central hernia GU: Deferred. Musculoskeletal: Has a Right BKA Skin: Large Sacral Ulcer. No induration; Warm and dry.  Neurologic: CN 2-12 grossly intact with no focal deficits. Romberg sign cerebellar reflexes not assessed.  Psychiatric: Normal judgment and insight. Alert and oriented x 3. Normal mood and appropriate affect.   Data Reviewed: I have personally reviewed following labs and imaging studies  CBC:  Recent Labs Lab 04/14/17 0243 04/15/17 0325 04/17/17 0214 04/18/17 0320 04/20/17 0600  WBC 16.2* 22.6* 21.3* 15.6* 14.3*  HGB 8.0* 8.7* 8.8* 8.5* 7.1*  HCT 25.9* 28.1* 28.4* 27.5* 22.4*  MCV 74.6* 75.5* 74.7* 73.5* 73.0*  PLT 308 404* 392 387 356   Basic Metabolic Panel:  Recent Labs Lab 04/19/17 0317 04/19/17 1122 04/19/17 1645 04/19/17 2228 04/20/17 0600  NA 121* 118* 119* 117* 117*  K 4.3 4.2 3.7 4.3 4.0  CL 83* 81* 82* 80* 81*  CO2 24 25 24 24 23   GLUCOSE 213* 296* 178* 242* 271*  BUN 69* 65* 63* 63* 59*  CREATININE 2.08* 1.91* 1.72* 1.81* 1.62*  CALCIUM 8.3* 8.2* 7.8* 8.0* 7.8*   GFR: Estimated Creatinine  Clearance: 68.7 mL/min (A) (by C-G formula based on SCr of 1.62 mg/dL (H)). Liver Function Tests: No results for input(s): AST, ALT, ALKPHOS, BILITOT, PROT, ALBUMIN in the last 168 hours. No results for input(s): LIPASE, AMYLASE in the last 168 hours. No results for input(s): AMMONIA in the last 168 hours. Coagulation Profile: No results for input(s): INR, PROTIME in the last 168 hours. Cardiac Enzymes: No results for input(s): CKTOTAL, CKMB, CKMBINDEX, TROPONINI in the last 168 hours. BNP (last 3 results) No results for input(s): PROBNP in the last 8760 hours. HbA1C: No results for input(s): HGBA1C in the last 72 hours. CBG:  Recent Labs Lab 04/19/17 1700 04/19/17 2122 04/20/17 0805 04/20/17 1228 04/20/17 1643  GLUCAP 185* 229* 367* 246* 83   Lipid Profile: No results for input(s): CHOL, HDL, LDLCALC, TRIG, CHOLHDL, LDLDIRECT in the last 72 hours. Thyroid Function Tests: No results for input(s): TSH, T4TOTAL, FREET4, T3FREE, THYROIDAB in the last 72 hours. Anemia Panel: No results for input(s): VITAMINB12, FOLATE, FERRITIN, TIBC, IRON, RETICCTPCT in the last 72 hours. Sepsis Labs: No results for input(s): PROCALCITON, LATICACIDVEN in the last 168 hours.  Recent Results (from the past 240 hour(s))  MRSA PCR Screening     Status: Abnormal   Collection Time: 04/13/17 11:42 AM  Result Value Ref Range Status   MRSA by PCR POSITIVE (A) NEGATIVE Final    Comment:        The GeneXpert MRSA Assay (FDA approved for NASAL specimens only), is one component of a comprehensive MRSA colonization surveillance program. It is not intended to diagnose MRSA infection nor to guide or monitor treatment for MRSA infections. RESULT CALLED TO, READ BACK BY AND VERIFIED WITH: A. Mabe RN 13:10 04/13/17 ([email or web address])     Radiology Studies: No results found.  Scheduled Meds: . atorvastatin  80 mg Oral QHS  . collagenase   Topical Daily  . diltiazem  240 mg Oral Daily  . docusate sodium  100  mg Oral Daily  . feeding supplement (GLUCERNA SHAKE)  237 mL Oral TID BM  . feeding supplement (PRO-STAT SUGAR FREE 64)  30 mL Oral TID WC  . gabapentin  600 mg Oral BID  . insulin aspart  0-20 Units Subcutaneous TID WC  . insulin aspart  0-5 Units Subcutaneous QHS  . insulin aspart  25 Units Subcutaneous TID WC  . [START ON 04/21/2017] insulin glargine  90 Units Subcutaneous Daily   And  . insulin glargine  10 Units Subcutaneous QHS  . metoprolol tartrate  50 mg Oral BID  . multivitamin with minerals  1 tablet Oral Daily  . polyethylene glycol  17 g Oral BID  . rivaroxaban  20 mg Oral Q supper  . senna-docusate  1 tablet Oral BID   Continuous Infusions: . sodium chloride 100 mL/hr at 04/20/17 0051  . sodium chloride      LOS: 8 days   Merlene Laughter, DO [name] Pager [phone]  If 7PM-7AM, please contact night-coverage [email or web address] Password [email or web address] 04/20/2017, 7:14 PM

## 2017-04-20 NOTE — Progress Notes (Signed)
Physical Therapy Wound Treatment Patient Details  Name: [name] MRN: [id number] Date of Birth: 13-Mar-1951  Today's Date: 04/20/2017 Time: 0076-2263 Time Calculation (min): 62 min  Subjective  Patient and Family Stated Goals: Heal wound  Pain Score:  up to 8  Wound Assessment  Pressure Injury 03/23/17 Unstageable - Full thickness tissue loss in which the base of the ulcer is covered by slough (yellow, tan, gray, green or brown) and/or eschar (tan, brown or black) in the wound bed. WOC updated 04/13/17 unstageable (Active)  Dressing Type ABD;Barrier Film (skin prep);Gauze (Comment);Moist to dry 04/20/2017  1:21 PM  Dressing Clean;Dry;Intact 04/20/2017  1:21 PM  Dressing Change Frequency Daily 04/20/2017  1:21 PM  State of Healing Eschar 04/20/2017  1:21 PM  Site / Wound Assessment Black;Brown;Painful;Red;Yellow 04/20/2017  1:21 PM  % Wound base Red or Granulating 20% 04/20/2017  1:21 PM  % Wound base Yellow/Fibrinous Exudate 40% 04/20/2017  1:21 PM  % Wound base Black/Eschar 40% 04/20/2017  1:21 PM  % Wound base Other/Granulation Tissue (Comment) 0% 04/18/2017  1:00 PM  Peri-wound Assessment Intact;Pink 04/20/2017  1:21 PM  Wound Length (cm) 12.5 cm 04/14/2017  2:38 PM  Wound Width (cm) 12.5 cm 04/14/2017  2:38 PM  Wound Depth (cm) 2 cm 04/14/2017  2:38 PM  Wound Surface Area (cm^2) 156.25 cm^2 04/14/2017  2:38 PM  Wound Volume (cm^3) 312.5 cm^3 04/14/2017  2:38 PM  Margins Unattached edges (unapproximated) 04/20/2017  1:21 PM  Drainage Amount Copious 04/20/2017  1:21 PM  Drainage Description Serosanguineous;Sanguineous 04/20/2017  1:21 PM  Treatment Cleansed;Debridement (Selective);Hydrotherapy (Pulse lavage);Packing (Saline gauze) 04/20/2017  1:21 PM   Santyl applied to wound bed prior to applying dressing.    Hydrotherapy Pulsed lavage therapy - wound location: sacral Pulsed Lavage with Suction (psi): 12 psi Pulsed Lavage with Suction - Normal Saline Used: 1000 mL Pulsed  Lavage Tip: Tip with splash shield Selective Debridement Selective Debridement - Location: sacral Selective Debridement - Tools Used: Forceps;Scalpel;Scissors Selective Debridement - Tissue Removed: slough, eschar and necrosing adipose   Wound Assessment and Plan  Wound Therapy - Assess/Plan/Recommendations Wound Therapy - Clinical Statement: pt could benefit from hydro for PLS to cleanse soften and decrease bacterial load, to prep for selective debridement. Pt has been unable to tolerate the pain of debridement in past sessions, however with break through pain meds and topical lidocaine pt able to tolerate bedside debridement this session. Wound Therapy - Functional Problem List: pt unable to spend any significant time in sitting due to this wound and  become progressively less mobile. Factors Delaying/Impairing Wound Healing: Diabetes Mellitus;Immobility;Multiple medical problems Hydrotherapy Plan: Debridement;Dressing change;Patient/family education;Pulsatile lavage with suction Wound Therapy - Frequency: 6X / week Wound Therapy - Current Recommendations: PT;WOC nurse Wound Therapy - Follow Up Recommendations: Skilled nursing facility Wound Plan: see above  Wound Therapy Goals- Improve the function of patient's integumentary system by progressing the wound(s) through the phases of wound healing (inflammation - proliferation - remodeling) by: Decrease Necrotic Tissue to: 50 Decrease Necrotic Tissue - Progress: Progressing toward goal Increase Granulation Tissue to: 50% Increase Granulation Tissue - Progress: Progressing toward goal Goals/treatment plan/discharge plan were made with and agreed upon by patient/family: Yes Time For Goal Achievement: 7 days Wound Therapy - Potential for Goals: Good  Goals will be updated until maximal potential achieved or discharge criteria met.  Discharge criteria: when goals achieved, discharge from hospital, MD decision/surgical intervention, no progress  towards goals, refusal/missing three consecutive treatments without notification or medical reason.  GP     Tessie Fass [name] 04/20/2017, 1:23 PM 04/20/2017  Donnella Sham, [place] [phone]  (pager)

## 2017-04-20 NOTE — Progress Notes (Signed)
Patient ID: [name], male   DOB: 1950-08-27, 66 y.o.   MRN: [id number]  [facility] Progress Note     Subjective: CC- sacral wound PT at bedside performing hydrotherapy. There is an area that is bleeding, would not stop after holding 20 minutes of pressure. VSS.  Objective: Vital signs in last 24 hours: Temp:  [97.9 F (36.6 C)-98.2 F (36.8 C)] 98.2 F (36.8 C) (10/10 0900) Pulse Rate:  [66-72] 71 (10/10 0900) Resp:  [12-19] 18 (10/10 0900) BP: (99-112)/(44-69) 107/61 (10/10 0900) SpO2:  [98 %-100 %] 100 % (10/10 0900) Weight:  [310 lb (140.6 kg)] 310 lb (140.6 kg) (10/10 0558) Last BM Date: 04/18/17  Intake/Output from previous day: 10/09 0701 - 10/10 0700 In: 2861.3 [P.O.:1730; I.V.:1131.3] Out: 2650 [Urine:2650] Intake/Output this shift: Total I/O In: 240 [P.O.:240] Out: 500 [Urine:500]  PE: Gen:  Alert, NAD HEENT: EOM's intact, pupils equal and round Pulm:  effort normal Ext:  S/p right BKA Psych: A&Ox3  Sacrum: 2 areas near proximal aspect of wound with bleeding one of which was pulsatile, 2 sutures with 2-0 vicryl placed in each area then thrombin spray applied to wound and hemostasis occurred. Wet to dry dressing applied.     Lab Results:   Recent Labs  04/18/17 0320 04/20/17 0600  WBC 15.6* 14.3*  HGB 8.5* 7.1*  HCT 27.5* 22.4*  PLT 387 356   BMET  Recent Labs  04/19/17 2228 04/20/17 0600  NA 117* 117*  K 4.3 4.0  CL 80* 81*  CO2 24 23  GLUCOSE 242* 271*  BUN 63* 59*  CREATININE 1.81* 1.62*  CALCIUM 8.0* 7.8*   PT/INR No results for input(s): LABPROT, INR in the last 72 hours. CMP     Component Value Date/Time   NA 117 (LL) 04/20/2017 0600   NA 136 (A) 03/22/2017   K 4.0 04/20/2017 0600   CL 81 (L) 04/20/2017 0600   CO2 23 04/20/2017 0600   GLUCOSE 271 (H) 04/20/2017 0600   BUN 59 (H) 04/20/2017 0600   BUN 34 (A) 03/22/2017   CREATININE 1.62 (H) 04/20/2017 0600   CREATININE 1.57 (H) 08/06/2016 1438   CALCIUM 7.8 (L) 04/20/2017 0600   PROT 7.5 04/11/2017 0428   ALBUMIN 2.5 (L) 04/11/2017 0428   AST 21 04/11/2017 0428   ALT 11 (L) 04/11/2017 0428   ALKPHOS 87 04/11/2017 0428   BILITOT 1.4 (H) 04/11/2017 0428   GFRNONAA 43 (L) 04/20/2017 0600   GFRNONAA 46 (L) 08/06/2016 1438   GFRAA 50 (L) 04/20/2017 0600   GFRAA 53 (L) 08/06/2016 1438   Lipase  No results found for: LIPASE     Studies/Results: No results found.  Anti-infectives: Anti-infectives    Start     Dose/Rate Route Frequency Ordered Stop   04/15/17 1315  levofloxacin (LEVAQUIN) tablet 750 mg     750 mg Oral Daily 04/15/17 1310 04/19/17 1122       Assessment/Plan Sacral decubitus ulcer - wound appears to be cleaning up well. Would not recommend debridement in OR. Continue hydrotherapy. Continue santyl and wet to dry dressings. Will discuss possibility of wound vac with WOC team.   LOS: 8 days    Franne Forts , [facility] 04/20/2017, 1:17 PM Pager: [phone] Consults: [phone] Mon-Fri 7:00 am-4:30 pm Sat-Sun 7:00 am-11:30 am

## 2017-04-20 NOTE — Plan of Care (Signed)
Problem: Pain Managment: Goal: General experience of comfort will improve Outcome: Not Progressing Pt reports that pain is constant and reports that he doesn't receive enough relief especially during hydotherapy. Pt given additional dose of 1mg  Dilaudid per MD order this am with hydrotherapy.

## 2017-04-20 NOTE — Plan of Care (Signed)
Problem: Education: Goal: Knowledge of [place] [facility] Education information/materials will improve Outcome: Progressing POC and pain management reviewed with pt.   

## 2017-04-20 NOTE — Consult Note (Signed)
WOC contacted for possibility of NPWT VAC dressing application.  WOC nurse will evaluate patient in the am.  Will not be on the [facility] campus till late morning.  Continue with hydrotherapy until WOC nurse can assess for success with VAC dressing.  Deliana Avalos [facility], CNS, [employer] [phone]

## 2017-04-20 NOTE — Progress Notes (Signed)
CCMD called pt. had a 2.16 sec. pause.

## 2017-04-21 DIAGNOSIS — E1142 Type 2 diabetes mellitus with diabetic polyneuropathy: Secondary | ICD-10-CM | POA: Diagnosis not present

## 2017-04-21 DIAGNOSIS — E1122 Type 2 diabetes mellitus with diabetic chronic kidney disease: Secondary | ICD-10-CM | POA: Diagnosis not present

## 2017-04-21 DIAGNOSIS — D62 Acute posthemorrhagic anemia: Secondary | ICD-10-CM | POA: Diagnosis not present

## 2017-04-21 DIAGNOSIS — L89159 Pressure ulcer of sacral region, unspecified stage: Secondary | ICD-10-CM | POA: Diagnosis not present

## 2017-04-21 DIAGNOSIS — L929 Granulomatous disorder of the skin and subcutaneous tissue, unspecified: Secondary | ICD-10-CM | POA: Diagnosis not present

## 2017-04-21 DIAGNOSIS — I130 Hypertensive heart and chronic kidney disease with heart failure and stage 1 through stage 4 chronic kidney disease, or unspecified chronic kidney disease: Secondary | ICD-10-CM | POA: Diagnosis not present

## 2017-04-21 DIAGNOSIS — I2510 Atherosclerotic heart disease of native coronary artery without angina pectoris: Secondary | ICD-10-CM | POA: Diagnosis not present

## 2017-04-21 DIAGNOSIS — Z89511 Acquired absence of right leg below knee: Secondary | ICD-10-CM | POA: Diagnosis not present

## 2017-04-21 DIAGNOSIS — M96840 Postprocedural hematoma of a musculoskeletal structure following a musculoskeletal system procedure: Secondary | ICD-10-CM | POA: Diagnosis not present

## 2017-04-21 DIAGNOSIS — G4733 Obstructive sleep apnea (adult) (pediatric): Secondary | ICD-10-CM | POA: Diagnosis not present

## 2017-04-21 DIAGNOSIS — E119 Type 2 diabetes mellitus without complications: Secondary | ICD-10-CM | POA: Diagnosis not present

## 2017-04-21 DIAGNOSIS — L0390 Cellulitis, unspecified: Secondary | ICD-10-CM | POA: Diagnosis not present

## 2017-04-21 DIAGNOSIS — I96 Gangrene, not elsewhere classified: Secondary | ICD-10-CM | POA: Diagnosis not present

## 2017-04-21 DIAGNOSIS — E440 Moderate protein-calorie malnutrition: Secondary | ICD-10-CM | POA: Diagnosis not present

## 2017-04-21 DIAGNOSIS — N189 Chronic kidney disease, unspecified: Secondary | ICD-10-CM | POA: Diagnosis not present

## 2017-04-21 DIAGNOSIS — T8789 Other complications of amputation stump: Secondary | ICD-10-CM | POA: Diagnosis not present

## 2017-04-21 DIAGNOSIS — I482 Chronic atrial fibrillation: Secondary | ICD-10-CM | POA: Diagnosis not present

## 2017-04-21 DIAGNOSIS — E1121 Type 2 diabetes mellitus with diabetic nephropathy: Secondary | ICD-10-CM | POA: Diagnosis not present

## 2017-04-21 DIAGNOSIS — G8929 Other chronic pain: Secondary | ICD-10-CM | POA: Diagnosis not present

## 2017-04-21 DIAGNOSIS — R451 Restlessness and agitation: Secondary | ICD-10-CM | POA: Diagnosis not present

## 2017-04-21 DIAGNOSIS — T45515A Adverse effect of anticoagulants, initial encounter: Secondary | ICD-10-CM | POA: Diagnosis not present

## 2017-04-21 DIAGNOSIS — E861 Hypovolemia: Secondary | ICD-10-CM | POA: Diagnosis not present

## 2017-04-21 DIAGNOSIS — D72829 Elevated white blood cell count, unspecified: Secondary | ICD-10-CM | POA: Diagnosis not present

## 2017-04-21 DIAGNOSIS — R2681 Unsteadiness on feet: Secondary | ICD-10-CM | POA: Diagnosis not present

## 2017-04-21 DIAGNOSIS — I4891 Unspecified atrial fibrillation: Secondary | ICD-10-CM | POA: Diagnosis not present

## 2017-04-21 DIAGNOSIS — N183 Chronic kidney disease, stage 3 (moderate): Secondary | ICD-10-CM | POA: Diagnosis not present

## 2017-04-21 DIAGNOSIS — J9691 Respiratory failure, unspecified with hypoxia: Secondary | ICD-10-CM | POA: Diagnosis not present

## 2017-04-21 DIAGNOSIS — D509 Iron deficiency anemia, unspecified: Secondary | ICD-10-CM | POA: Diagnosis not present

## 2017-04-21 DIAGNOSIS — R6889 Other general symptoms and signs: Secondary | ICD-10-CM | POA: Diagnosis not present

## 2017-04-21 DIAGNOSIS — K922 Gastrointestinal hemorrhage, unspecified: Secondary | ICD-10-CM | POA: Diagnosis not present

## 2017-04-21 DIAGNOSIS — K219 Gastro-esophageal reflux disease without esophagitis: Secondary | ICD-10-CM | POA: Diagnosis not present

## 2017-04-21 DIAGNOSIS — L89150 Pressure ulcer of sacral region, unstageable: Secondary | ICD-10-CM | POA: Diagnosis not present

## 2017-04-21 DIAGNOSIS — Z9181 History of falling: Secondary | ICD-10-CM | POA: Diagnosis not present

## 2017-04-21 DIAGNOSIS — I480 Paroxysmal atrial fibrillation: Secondary | ICD-10-CM | POA: Diagnosis not present

## 2017-04-21 DIAGNOSIS — Z66 Do not resuscitate: Secondary | ICD-10-CM | POA: Diagnosis not present

## 2017-04-21 DIAGNOSIS — R739 Hyperglycemia, unspecified: Secondary | ICD-10-CM | POA: Diagnosis not present

## 2017-04-21 DIAGNOSIS — T8189XA Other complications of procedures, not elsewhere classified, initial encounter: Secondary | ICD-10-CM | POA: Diagnosis not present

## 2017-04-21 DIAGNOSIS — E785 Hyperlipidemia, unspecified: Secondary | ICD-10-CM | POA: Diagnosis not present

## 2017-04-21 DIAGNOSIS — E1151 Type 2 diabetes mellitus with diabetic peripheral angiopathy without gangrene: Secondary | ICD-10-CM | POA: Diagnosis not present

## 2017-04-21 DIAGNOSIS — L8992 Pressure ulcer of unspecified site, stage 2: Secondary | ICD-10-CM | POA: Diagnosis not present

## 2017-04-21 DIAGNOSIS — E1165 Type 2 diabetes mellitus with hyperglycemia: Secondary | ICD-10-CM | POA: Diagnosis not present

## 2017-04-21 DIAGNOSIS — I5030 Unspecified diastolic (congestive) heart failure: Secondary | ICD-10-CM | POA: Diagnosis not present

## 2017-04-21 DIAGNOSIS — S31802A Laceration with foreign body of unspecified buttock, initial encounter: Secondary | ICD-10-CM | POA: Diagnosis not present

## 2017-04-21 DIAGNOSIS — L89154 Pressure ulcer of sacral region, stage 4: Secondary | ICD-10-CM | POA: Diagnosis not present

## 2017-04-21 DIAGNOSIS — M549 Dorsalgia, unspecified: Secondary | ICD-10-CM | POA: Diagnosis not present

## 2017-04-21 DIAGNOSIS — N179 Acute kidney failure, unspecified: Secondary | ICD-10-CM | POA: Diagnosis not present

## 2017-04-21 DIAGNOSIS — R2689 Other abnormalities of gait and mobility: Secondary | ICD-10-CM | POA: Diagnosis not present

## 2017-04-21 DIAGNOSIS — S31809A Unspecified open wound of unspecified buttock, initial encounter: Secondary | ICD-10-CM | POA: Diagnosis not present

## 2017-04-21 DIAGNOSIS — M545 Low back pain: Secondary | ICD-10-CM | POA: Diagnosis not present

## 2017-04-21 DIAGNOSIS — M6281 Muscle weakness (generalized): Secondary | ICD-10-CM | POA: Diagnosis not present

## 2017-04-21 DIAGNOSIS — G894 Chronic pain syndrome: Secondary | ICD-10-CM | POA: Diagnosis not present

## 2017-04-21 DIAGNOSIS — I739 Peripheral vascular disease, unspecified: Secondary | ICD-10-CM | POA: Diagnosis not present

## 2017-04-21 DIAGNOSIS — E871 Hypo-osmolality and hyponatremia: Secondary | ICD-10-CM | POA: Diagnosis not present

## 2017-04-21 DIAGNOSIS — I5032 Chronic diastolic (congestive) heart failure: Secondary | ICD-10-CM | POA: Diagnosis not present

## 2017-04-21 DIAGNOSIS — E11319 Type 2 diabetes mellitus with unspecified diabetic retinopathy without macular edema: Secondary | ICD-10-CM | POA: Diagnosis not present

## 2017-04-21 DIAGNOSIS — E1140 Type 2 diabetes mellitus with diabetic neuropathy, unspecified: Secondary | ICD-10-CM | POA: Diagnosis not present

## 2017-04-21 DIAGNOSIS — N492 Inflammatory disorders of scrotum: Secondary | ICD-10-CM | POA: Diagnosis not present

## 2017-04-21 DIAGNOSIS — R5381 Other malaise: Secondary | ICD-10-CM | POA: Diagnosis not present

## 2017-04-21 LAB — TYPE AND SCREEN
ABO/RH(D): A NEG
Antibody Screen: NEGATIVE
Unit division: 0
Unit division: 0

## 2017-04-21 LAB — BPAM RBC
Blood Product Expiration Date: 201810242359
Blood Product Expiration Date: 201810272359
ISSUE DATE / TIME: 201810101504
ISSUE DATE / TIME: 201810101755
UNIT TYPE AND RH: 600
Unit Type and Rh: 600

## 2017-04-21 LAB — MAGNESIUM: MAGNESIUM: 2.1 mg/dL (ref 1.7–2.4)

## 2017-04-21 LAB — GLUCOSE, CAPILLARY
GLUCOSE-CAPILLARY: 140 mg/dL — AB (ref 65–99)
GLUCOSE-CAPILLARY: 154 mg/dL — AB (ref 65–99)
Glucose-Capillary: 140 mg/dL — ABNORMAL HIGH (ref 65–99)
Glucose-Capillary: 103 mg/dL — ABNORMAL HIGH (ref 65–99)

## 2017-04-21 LAB — COMPREHENSIVE METABOLIC PANEL
ALT: 12 U/L — ABNORMAL LOW (ref 17–63)
ANION GAP: 10 (ref 5–15)
AST: 19 U/L (ref 15–41)
Albumin: 2.1 g/dL — ABNORMAL LOW (ref 3.5–5.0)
Alkaline Phosphatase: 112 U/L (ref 38–126)
BILIRUBIN TOTAL: 2 mg/dL — AB (ref 0.3–1.2)
BUN: 47 mg/dL — ABNORMAL HIGH (ref 6–20)
CO2: 25 mmol/L (ref 22–32)
Calcium: 8.2 mg/dL — ABNORMAL LOW (ref 8.9–10.3)
Chloride: 90 mmol/L — ABNORMAL LOW (ref 101–111)
Creatinine, Ser: 1.4 mg/dL — ABNORMAL HIGH (ref 0.61–1.24)
GFR calc Af Amer: 59 mL/min — ABNORMAL LOW (ref >60)
GFR, EST NON AFRICAN AMERICAN: 51 mL/min — AB (ref >60)
Glucose, Bld: 108 mg/dL — ABNORMAL HIGH (ref 65–99)
POTASSIUM: 3.6 mmol/L (ref 3.5–5.1)
Sodium: 125 mmol/L — ABNORMAL LOW (ref 135–145)
TOTAL PROTEIN: 6.7 g/dL (ref 6.5–8.1)

## 2017-04-21 LAB — CBC WITH DIFFERENTIAL/PLATELET
BASOS PCT: 0 %
Basophils Absolute: 0 10*3/uL (ref 0.0–0.1)
Eosinophils Absolute: 0.2 10*3/uL (ref 0.0–0.7)
Eosinophils Relative: 2 %
HEMATOCRIT: 26.3 % — AB (ref 39.0–52.0)
Hemoglobin: 8.7 g/dL — ABNORMAL LOW (ref 13.0–17.0)
Lymphocytes Relative: 8 %
Lymphs Abs: 1.2 10*3/uL (ref 0.7–4.0)
MCH: 24.5 pg — ABNORMAL LOW (ref 26.0–34.0)
MCHC: 33.1 g/dL (ref 30.0–36.0)
MCV: 74.1 fL — AB (ref 78.0–100.0)
MONO ABS: 1.2 10*3/uL — AB (ref 0.1–1.0)
MONOS PCT: 8 %
NEUTROS ABS: 12 10*3/uL — AB (ref 1.7–7.7)
Neutrophils Relative %: 82 %
Platelets: 300 10*3/uL (ref 150–400)
RBC: 3.55 MIL/uL — ABNORMAL LOW (ref 4.22–5.81)
RDW: 20.4 % — AB (ref 11.5–15.5)
WBC: 14.6 10*3/uL — ABNORMAL HIGH (ref 4.0–10.5)

## 2017-04-21 LAB — PHOSPHORUS: Phosphorus: 2.7 mg/dL (ref 2.5–4.6)

## 2017-04-21 MED ORDER — SILVER NITRATE-POT NITRATE 75-25 % EX MISC: 10.0000 | CUTANEOUS | 0 refills | Status: DC | PRN

## 2017-04-21 MED ORDER — INSULIN GLARGINE 100 UNIT/ML ~~LOC~~ SOLN
10.0000 [IU] | Freq: Every day | SUBCUTANEOUS | 11 refills | Status: DC
Start: 2017-04-21 — End: 2017-09-13

## 2017-04-21 MED ORDER — POLYETHYLENE GLYCOL 3350 17 G PO PACK: 17.0000 g | PACK | Freq: Two times a day (BID) | ORAL | 0 refills | Status: DC

## 2017-04-21 MED ORDER — HYDROMORPHONE HCL 1 MG/ML IJ SOLN
1.0000 mg | INTRAMUSCULAR | Status: DC | PRN
  Administered 2017-04-21 (×2): 1 mg via INTRAVENOUS
  Filled 2017-04-21 (×2): qty 1

## 2017-04-21 MED ORDER — SENNOSIDES-DOCUSATE SODIUM 8.6-50 MG PO TABS: 1.0000 | ORAL_TABLET | Freq: Two times a day (BID) | ORAL | 0 refills | Status: DC

## 2017-04-21 MED ORDER — LIDOCAINE 4 % EX CREA
TOPICAL_CREAM | Freq: Every day | CUTANEOUS | 0 refills | Status: AC | PRN
Start: 2017-04-21 — End: ?

## 2017-04-21 MED ORDER — GLUCERNA SHAKE PO LIQD: 237.0000 mL | Freq: Three times a day (TID) | ORAL | 0 refills | Status: AC

## 2017-04-21 MED ORDER — PRO-STAT SUGAR FREE PO LIQD: 30.0000 mL | Freq: Three times a day (TID) | ORAL | 0 refills | Status: AC

## 2017-04-21 MED ORDER — INSULIN GLARGINE 100 UNIT/ML ~~LOC~~ SOLN: 90.0000 [IU] | Freq: Every day | SUBCUTANEOUS | 11 refills | Status: DC

## 2017-04-21 NOTE — Progress Notes (Signed)
[facility] ROUNDING NOTE   Subjective:  [name] a 66 y.o.male,w hypertension, hyperlipidemia, Dm2, w R BKA, CKD stage3, Pafib, CAD apparently presented due to bleeding from large sacral decubitus ulcer. Pt sent to ED for evaluation. He received 2 units of PRBC to date in addition to debridement. He is currently undergoing hydrotherapy. Levaquin added for leukocytosis in addition to exudative material concerning for pseudomonal infection.  He was admitted with hyponatremia and a serum sodium 117    Previous sodium 128 10/7 His urine osm is 240 and urine sodium <10   Blood sugars 270 and  Normal cortisol  TSH 0.448   Blood pressure has been borderline low   Significant urine output 1 - 2 L daily   With addition of solute in form of normal saline  The sodium has improved to 125 this morning    Objective:  Vital signs in last 24 hours:  Temp:  [98.2 F (36.8 C)-100.1 F (37.8 C)] 98.3 F (36.8 C) (10/11 0614) Pulse Rate:  [63-85] 74 (10/11 0614) Resp:  [16-20] 20 (10/11 0614) BP: (98-147)/(46-133) 120/52 (10/11 0614) SpO2:  [93 %-100 %] 98 % (10/11 0614)  Weight change:  Filed Weights   04/18/17 0303 04/18/17 2204 04/20/17 0558  Weight: (!) 310 lb (140.6 kg) (!) 309 lb (140.2 kg) (!) 310 lb (140.6 kg)    Intake/Output: I/O last 3 completed shifts: In: 5565.3 [P.O.:1650; I.V.:3613.3; Blood:302] Out: 3700 [Urine:3700]   Intake/Output this shift:  Total I/O In: 240 [P.O.:240] Out: -   CVS- RRR RS- CTA ABD- BS present soft non-distended EXT- no edema   Basic Metabolic Panel:  Recent Labs Lab 04/19/17 1645 04/19/17 2228 04/20/17 0600 04/20/17 2301 04/21/17 0312  NA 119* 117* 117* 122* 125*  K 3.7 4.3 4.0 4.0 3.6  CL 82* 80* 81* 88* 90*  CO2 24 24 23 23 25   GLUCOSE 178* 242* 271* 99 108*  BUN 63* 63* 59* 52* 47*  CREATININE 1.72* 1.81* 1.62* 1.49* 1.40*  CALCIUM 7.8* 8.0* 7.8* 8.1* 8.2*  MG  --   --   --   --  2.1  PHOS  --    --   --  2.6 2.7    Liver Function Tests:  Recent Labs Lab 04/20/17 2301 04/21/17 0312  AST  --  19  ALT  --  12*  ALKPHOS  --  112  BILITOT  --  2.0*  PROT  --  6.7  ALBUMIN 2.2* 2.1*   No results for input(s): LIPASE, AMYLASE in the last 168 hours. No results for input(s): AMMONIA in the last 168 hours.  CBC:  Recent Labs Lab 04/15/17 0325 04/17/17 0214 04/18/17 0320 04/20/17 0600 04/21/17 0312  WBC 22.6* 21.3* 15.6* 14.3* 14.6*  NEUTROABS  --   --   --   --  12.0*  HGB 8.7* 8.8* 8.5* 7.1* 8.7*  HCT 28.1* 28.4* 27.5* 22.4* 26.3*  MCV 75.5* 74.7* 73.5* 73.0* 74.1*  PLT 404* 392 387 356 300    Cardiac Enzymes: No results for input(s): CKTOTAL, CKMB, CKMBINDEX, TROPONINI in the last 168 hours.  BNP: Invalid input(s): POCBNP  CBG:  Recent Labs Lab 04/20/17 0805 04/20/17 1228 04/20/17 1643 04/20/17 2144 04/21/17 0635  GLUCAP 367* 246* 83 82 140*    Microbiology: Results for orders placed or performed during the hospital encounter of 04/10/17  MRSA PCR Screening     Status: Abnormal   Collection Time: 04/13/17 11:42 AM  Result Value  Ref Range Status   MRSA by PCR POSITIVE (A) NEGATIVE Final    Comment:        The GeneXpert MRSA Assay (FDA approved for NASAL specimens only), is one component of a comprehensive MRSA colonization surveillance program. It is not intended to diagnose MRSA infection nor to guide or monitor treatment for MRSA infections. RESULT CALLED TO, READ BACK BY AND VERIFIED WITH: A. Mabe RN 13:10 04/13/17 ([email or web address])     Coagulation Studies: No results for input(s): LABPROT, INR in the last 72 hours.  Urinalysis: No results for input(s): COLORURINE, LABSPEC, PHURINE, GLUCOSEU, HGBUR, BILIRUBINUR, KETONESUR, PROTEINUR, UROBILINOGEN, NITRITE, LEUKOCYTESUR in the last 72 hours.  Invalid input(s): APPERANCEUR    Imaging: No results found.   Medications:   . sodium chloride 100 mL/hr (04/21/17 0529)  . sodium chloride      . atorvastatin  80 mg Oral QHS  . collagenase   Topical Daily  . diltiazem  240 mg Oral Daily  . docusate sodium  100 mg Oral Daily  . feeding supplement (GLUCERNA SHAKE)  237 mL Oral TID BM  . feeding supplement (PRO-STAT SUGAR FREE 64)  30 mL Oral TID WC  . gabapentin  600 mg Oral BID  . insulin aspart  0-20 Units Subcutaneous TID WC  . insulin aspart  0-5 Units Subcutaneous QHS  . insulin aspart  25 Units Subcutaneous TID WC  . insulin glargine  90 Units Subcutaneous Daily   And  . insulin glargine  10 Units Subcutaneous QHS  . metoprolol tartrate  50 mg Oral BID  . multivitamin with minerals  1 tablet Oral Daily  . polyethylene glycol  17 g Oral BID  . rivaroxaban  20 mg Oral Q supper  . senna-docusate  1 tablet Oral BID   acetaminophen **OR** acetaminophen, HYDROmorphone (DILAUDID) injection, lidocaine, oxyCODONE, silver nitrate applicators  Assessment/ Plan:   Hyponatremia  Hypovolemic  recommended IV fluids and think that normal saline should be fine to use to replete the volume and think 100 cc /hour is adequate   He has very poor oral solute intake and this will need to be addressed at this time there is nothing that would suggest SIADH or endo/metabolic cause of the hyponatremia   This sodium is improving nicely although the overall prognosis is not good and possible feeding tube conversations may need to be had if appropriate      LOS: 9 Tyreanna Bisesi W @TODAY @10 :04 AM

## 2017-04-21 NOTE — Progress Notes (Signed)
Physical Therapy Wound Treatment Patient Details  Name: [name] MRN: [id number] Date of Birth: 12/13/1950  Today's Date: 04/21/2017 Time: 1000-1103 Time Calculation (min): 63 min  Subjective  Subjective: Pt agreeable to hydrotherapy on second attempt - wanted to eat breakfast first. Patient and Family Stated Goals: Heal wound  Pain Score: Premedicated with PO pain meds, IV pain meds, and topical lidocaine.  Wound Assessment  Pressure Injury 03/23/17 Unstageable - Full thickness tissue loss in which the base of the ulcer is covered by slough (yellow, tan, gray, green or brown) and/or eschar (tan, brown or black) in the wound bed. WOC updated 04/13/17 unstageable (Active)  Dressing Type ABD;Barrier Film (skin prep);Gauze (Comment);Moist to dry 04/21/2017  2:01 PM  Dressing Clean;Dry;Intact 04/21/2017  2:01 PM  Dressing Change Frequency Daily 04/21/2017  2:01 PM  State of Healing Eschar 04/21/2017  2:01 PM  Site / Wound Assessment Brown;Painful;Red;Yellow;Bleeding 04/21/2017  2:01 PM  % Wound base Red or Granulating 25% 04/21/2017  2:01 PM  % Wound base Yellow/Fibrinous Exudate 45% 04/21/2017  2:01 PM  % Wound base Black/Eschar 30% 04/21/2017  2:01 PM  % Wound base Other/Granulation Tissue (Comment) 0% 04/21/2017  2:01 PM  Peri-wound Assessment Intact;Pink;Bleeding 04/21/2017  2:01 PM  Wound Length (cm) 12.5 cm 04/14/2017  2:38 PM  Wound Width (cm) 12.5 cm 04/14/2017  2:38 PM  Wound Depth (cm) 2 cm 04/14/2017  2:38 PM  Wound Surface Area (cm^2) 156.25 cm^2 04/14/2017  2:38 PM  Wound Volume (cm^3) 312.5 cm^3 04/14/2017  2:38 PM  Margins Unattached edges (unapproximated) 04/21/2017  2:01 PM  Drainage Amount Copious 04/21/2017  2:01 PM  Drainage Description Serosanguineous;Sanguineous;Odor 04/21/2017  2:01 PM  Treatment Debridement (Selective);Hydrotherapy (Pulse lavage);Packing (Saline gauze) 04/21/2017  2:01 PM   Santyl applied to wound bed prior to applying dressing.     Hydrotherapy Pulsed lavage therapy - wound location: sacral Pulsed Lavage with Suction (psi): 12 psi Pulsed Lavage with Suction - Normal Saline Used: 1000 mL Pulsed Lavage Tip: Tip with splash shield Selective Debridement Selective Debridement - Location: sacral Selective Debridement - Tools Used: Forceps;Scalpel;Scissors Selective Debridement - Tissue Removed: slough, eschar and necrosing adipose   Wound Assessment and Plan  Wound Therapy - Assess/Plan/Recommendations Wound Therapy - Clinical Statement: pt could benefit from hydro for PLS to cleanse soften and decrease bacterial load, to prep for selective debridement. Pt has been unable to tolerate the pain of debridement in past sessions, however with break through pain meds and topical lidocaine pt able to tolerate bedside debridement this session. Wound Therapy - Functional Problem List: pt unable to spend any significant time in sitting due to this wound and  become progressively less mobile. Factors Delaying/Impairing Wound Healing: Diabetes Mellitus;Immobility;Multiple medical problems Hydrotherapy Plan: Debridement;Dressing change;Patient/family education;Pulsatile lavage with suction Wound Therapy - Frequency: 6X / week Wound Therapy - Current Recommendations: PT;WOC nurse Wound Therapy - Follow Up Recommendations: Skilled nursing facility Wound Plan: see above  Wound Therapy Goals- Improve the function of patient's integumentary system by progressing the wound(s) through the phases of wound healing (inflammation - proliferation - remodeling) by: Decrease Necrotic Tissue to: 50 Decrease Necrotic Tissue - Progress: Progressing toward goal Increase Granulation Tissue to: 50% Increase Granulation Tissue - Progress: Progressing toward goal Improve Drainage Characteristics: Min;Serous Improve Drainage Characteristics - Progress: Progressing toward goal Goals/treatment plan/discharge plan were made with and agreed upon by  patient/family: Yes Time For Goal Achievement: 7 days Wound Therapy - Potential for Goals: Good  Goals will be updated  until maximal potential achieved or discharge criteria met.  Discharge criteria: when goals achieved, discharge from hospital, MD decision/surgical intervention, no progress towards goals, refusal/missing three consecutive treatments without notification or medical reason.  GP     [name] 04/21/2017, 2:14 PM   Rolinda Roan, PT, DPT Acute Rehabilitation Services Pager: [phone]

## 2017-04-21 NOTE — Consult Note (Signed)
Collaboration with PTA from hydrotherapy session this am.  WOC nurse just arrived to [facility] campus. They report that today during treatment he again is bleeding but was controlled better, however based on therapy notes and todays dressing change, feel that NPWT would be contraindicated due to bleeding tendency.  Also it was noted that patient is receiving another blood transfusion today as well.  Reviewed PT hydrotherapy notes, greater than 25% necrotic tissue as well which is a contraindication for use of NPWT.  I will continue to follow patient and follow up on Monday with PT, if bleeding has lessened may consider VAC at that time.    Karthik Whittinghill [facility], CNS, [employer] [phone]

## 2017-04-21 NOTE — Discharge Summary (Signed)
Physician Discharge Summary  [name] ZCH:[id number] DOB: 1950/10/19 DOA: 04/10/2017  PCP: [name], MD  Admit date: 04/10/2017 Discharge date: 04/21/2017  Admitted From: [facility] Disposition:  [facility]   Recommendations for Outpatient Follow-up:  1. Follow up with PCP in 1-2 weeks 2. Continue Hydrotherapy 3. Consider possible feeding tube as patient has very poor oral solute intake 4. Consider Wound Vac for large sacral ulcer if Bleeding has lessened 5. Please obtain CMP/CBC, Mag, Phos in one week  Home Health: No Equipment/Devices: None  Discharge Condition: Stable  CODE STATUS: DO NOT RESUSITATE Diet recommendation: Heart Healthy Carb modified Diet   Brief/Interim Summary: [name] a 66 y.o.male,w hypertension, hyperlipidemia, Dm2, w R BKA, CKD stage3, Pafib, CAD apparently presented due to bleeding from large sacral decubitus ulcer. Pt sent to ED for evaluation. He received 2 units of PRBC to date in addition to debridement. He is currently undergoing hydrotherapy. Levaquin added for leukocytosis in addition to exudative material concerning for pseudomonal infection. PT continuing Hydrotherapy and General Surgery would not recommend debridement in OR. Hospitalization complicated by Hyponatremia, Acute Blood Loss Anemia, as well as poor oral intake. WOC evaluated today for a Wound Vac and feel it may be contraindicated due to patient's bleeding tendency. Nephrology recommending continuing IVF with NS at 100 mL/hr and feel like may need TF soon given poor solute intake. Patient was evaluated for LTAC and accepted at [facility] and will be discharged today.   Discharge Diagnoses:  Principal Problem:   Hyperglycemia Active Problems:   Obesity, Class III, BMI 40-49.9 (morbid obesity) (HCC)   Atrial fibrillation   Below knee amputation status, right (HCC)   Hyponatremia   ARF (acute renal failure) (HCC)  Large Un-stagable Sacral  Ulcer -S/p debridement on 10/3. Hydrotherapy and dressing changes currently. Afebrile. No erythema. -General surgery recommendations: Would not recommend Debridement in OR per note -Continue Dilaudid prn for hydrotherapy -C/w Collagenase Topically and Silver Nitrate Topical Stick  -WOC for Wound Vac consulted but will not pursue at this time.   Leukocytosis, improved slightly -Afebrile. Ulcer without evidence of active infection. No wound cultures available. Improved. -WBC is stable at 14.6 -Continue Levaquin (Day 6/7) for possible infection of ulcer. Will need to adjust for renal function -Likely also from pain  Diabetes mellitusType 2 with Hyperglycemia -Uncontrolled. Patient refused night time Lantus secondary to lower blood sugar. -Continue SSI  -Continue Lantus 90 units qAM and 10 units qhs -CBG's ranging from 82-154  Hypovolemic Hyponatremia -Likely secondary to hypovolemia in the setting of overdiuresis.  -Sodium is slowly improving and went from 117 -> 125 -Nephrology consulted for further recc's -Recommending Continuing IVF with NS 100 mL/hr -Per Nephro patient has poor solute intake and may need Tube Feedings  Acute Blood Loss Anemia -Received 2 units of PRBC on 10/2 -Hb/Hct dropped to 7.1/22.4 yesterday; Repeat Hb/Hct went to 8.7/26.3 -Transfuse 2 more units of pRBC's on 04/20/17  -Repeat CBC in AM   Morbid Obesity -Body mass index is 45 kg/m. -Weight Loss Counseling given -C/w Nutritional Supplements with Glucerna 237 mL po TID and Pro-Stat 30 mL po TIDwm  Atrial Fibrillation -Rate controlled. Xarelto was initially held secondary to bleeding and debridement. -Continue Xarelto as there are no further plans for debridement per General Surgery.  Chronic Narcotic Use -Patient seen by pain management in the past  -Continue Oxycodone 20mg  q4 hours prn -Added IV Dilaudid 1 mg q4hprn for Sever Pain  Acute kidney injury on  CKD 3 -Worsened likely secondary to  overdiuresis but now improving -Discontinue Torsemide -BUN/Cr went from 76/2.25 -> 47/1.40 -C/w IVF with NS at 100 mL/hr -Repeat CMP in AM   Constipation -Opiate induced. -C/w Miralax 17 grams po BID -Senokot-S 1 tab po BID -Titrate down if having diarrhea  Discharge Instructions  Discharge Instructions    Call MD for:  difficulty breathing, headache or visual disturbances    Complete by:  As directed    Call MD for:  extreme fatigue    Complete by:  As directed    Call MD for:  hives    Complete by:  As directed    Call MD for:  persistant dizziness or light-headedness    Complete by:  As directed    Call MD for:  persistant nausea and vomiting    Complete by:  As directed    Call MD for:  redness, tenderness, or signs of infection (pain, swelling, redness, odor or green/yellow discharge around incision site)    Complete by:  As directed    Call MD for:  severe uncontrolled pain    Complete by:  As directed    Call MD for:  temperature >100.4    Complete by:  As directed    Diet - low sodium heart healthy    Complete by:  As directed    Discharge instructions    Complete by:  As directed    Follow up Care per [facility]   Increase activity slowly    Complete by:  As directed      Allergies as of 04/21/2017   No Known Allergies     Medication List    STOP taking these medications   metolazone 5 MG tablet Commonly known as:  ZAROXOLYN   spironolactone 25 MG tablet Commonly known as:  ALDACTONE   torsemide 20 MG tablet Commonly known as:  DEMADEX     TAKE these medications   acetaminophen 500 MG tablet Commonly known as:  TYLENOL Take 500 mg by mouth 3 (three) times daily.   aspirin 81 MG EC tablet Take 1 tablet (81 mg total) by mouth daily.   atorvastatin 80 MG tablet Commonly known as:  LIPITOR Take 1 tablet (80 mg total) by mouth daily. What changed:  when to take this   collagenase ointment Commonly known as:  SANTYL Apply 1 application  topically daily.   diltiazem 240 MG 24 hr capsule Commonly known as:  CARDIZEM CD Take 1 capsule (240 mg total) by mouth daily.   docusate sodium 100 MG capsule Commonly known as:  COLACE Take 1 capsule (100 mg total) by mouth daily.   feeding supplement (GLUCERNA SHAKE) Liqd Take 237 mLs by mouth 3 (three) times daily between meals.   feeding supplement (PRO-STAT SUGAR FREE 64) Liqd Take 30 mLs by mouth 3 (three) times daily with meals.   gabapentin 300 MG capsule Commonly known as:  NEURONTIN Take 2 capsules (600 mg total) by mouth 2 (two) times daily.   hydrocerin Crea Apply 1 application topically 3 (three) times daily. legs   insulin aspart 100 UNIT/ML injection Commonly known as:  novoLOG Inject 100 Units into the skin 3 (three) times daily after meals.   insulin glargine 100 UNIT/ML injection Commonly known as:  LANTUS Inject 0.1 mLs (10 Units total) into the skin at bedtime. What changed:  You were already taking a medication with the same name, and this prescription was added. Make sure you understand how  and when to take each.   insulin glargine 100 UNIT/ML injection Commonly known as:  LANTUS Inject 0.9 mLs (90 Units total) into the skin daily. What changed:  how much to take  when to take this  additional instructions   lidocaine 4 % cream Commonly known as:  LMX Apply topically daily as needed (during hydro).   metoprolol tartrate 50 MG tablet Commonly known as:  LOPRESSOR Take 1 tablet (50 mg total) by mouth 2 (two) times daily.   multivitamin with minerals Tabs tablet Take 1 tablet by mouth daily.   oxyCODONE 15 MG immediate release tablet Commonly known as:  ROXICODONE Take 1 tablet (15 mg total) by mouth every 4 (four) hours as needed for pain. Hold for Sedation   OXYGEN Inhale 2-3 L into the lungs continuous.   polyethylene glycol packet Commonly known as:  MIRALAX / GLYCOLAX Take 17 g by mouth 2 (two) times daily.   potassium  chloride SA 20 MEQ tablet Commonly known as:  K-DUR,KLOR-CON Take 40 mEq by mouth 2 (two) times daily.   senna-docusate 8.6-50 MG tablet Commonly known as:  Senokot-S Take 1 tablet by mouth 2 (two) times daily.   silver nitrate applicators 75-25 % applicator Apply 10 Sticks topically as needed for bleeding.   XARELTO 20 MG Tabs tablet Generic drug:  rivaroxaban TAKE 1 TABLET BY MOUTH EVERY DAY WITH SUPPER      Contact information for after-discharge care    Destination    [facility] .   Specialty:  Skilled Nursing Facility Contact information: [address] [place] [address] [phone]             No Known Allergies  Consultations:  General Surgery  Nephrology  Diabetes Education Coordinator   WOC Nurse  Procedures/Studies: No results found. Hydrotherapy  Subjective: Seen and examined and was in excruciating pain this AM. No CP or SOB. No nausea or vomiting. Agreeable to D/C to LTAC.  Discharge Exam: Vitals:   04/21/17 0614 04/21/17 1000  BP: (!) 120/52 (!) 106/59  Pulse: 74 78  Resp: 20 20  Temp: 98.3 F (36.8 C) 98.2 F (36.8 C)  SpO2: 98% 98%   Vitals:   04/20/17 1825 04/20/17 2042 04/21/17 0614 04/21/17 1000  BP: (!) 99/50 (!) 103/50 (!) 120/52 (!) 106/59  Pulse: 80 76 74 78  Resp: Temp: 98.5 F (36.9 C) 98.2 F (36.8 C) 98.3 F (36.8 C) 98.2 F (36.8 C)  TempSrc: Oral Oral Oral Oral  SpO2:  98% 98% 98%  Weight:      Height:       General: Pt is alert, awake and in some pain Cardiovascular: RRR, S1/S2 +, no rubs, no gallops Respiratory: Diminished bilaterally, no wheezing, no rhonchi Abdominal: Soft, NT, ND, bowel sounds +; Has abdominal Hernia Extremities: no edema, no cyanosis; Right BKA; Has large bleeding sacral ulcer  The results of significant diagnostics from this hospitalization (including imaging, microbiology, ancillary and laboratory) are listed below for reference.     Microbiology: Recent Results (from the past 240 hour(s))  MRSA PCR Screening     Status: Abnormal   Collection Time: 04/13/17 11:42 AM  Result Value Ref Range Status   MRSA by PCR POSITIVE (A) NEGATIVE Final    Comment:        The GeneXpert MRSA Assay (FDA approved for NASAL specimens only), is one component of a comprehensive MRSA colonization surveillance program. It is not  intended to diagnose MRSA infection nor to guide or monitor treatment for MRSA infections. RESULT CALLED TO, READ BACK BY AND VERIFIED WITH: A. Mabe RN 13:10 04/13/17 ([email or web address])     Labs: BNP (last 3 results)  Recent Labs  06/14/16 0303 01/22/17 0509 03/14/17 0412  BNP 101.8* 183.2* 414.8*   Basic Metabolic Panel:  Recent Labs Lab 04/19/17 1645 04/19/17 2228 04/20/17 0600 04/20/17 2301 04/21/17 0312  NA 119* 117* 117* 122* 125*  K 3.7 4.3 4.0 4.0 3.6  CL 82* 80* 81* 88* 90*  CO2 24 24 23 23 25   GLUCOSE 178* 242* 271* 99 108*  BUN 63* 63* 59* 52* 47*  CREATININE 1.72* 1.81* 1.62* 1.49* 1.40*  CALCIUM 7.8* 8.0* 7.8* 8.1* 8.2*  MG  --   --   --   --  2.1  PHOS  --   --   --  2.6 2.7   Liver Function Tests:  Recent Labs Lab 04/20/17 2301 04/21/17 0312  AST  --  19  ALT  --  12*  ALKPHOS  --  112  BILITOT  --  2.0*  PROT  --  6.7  ALBUMIN 2.2* 2.1*   No results for input(s): LIPASE, AMYLASE in the last 168 hours. No results for input(s): AMMONIA in the last 168 hours. CBC:  Recent Labs Lab 04/15/17 0325 04/17/17 0214 04/18/17 0320 04/20/17 0600 04/21/17 0312  WBC 22.6* 21.3* 15.6* 14.3* 14.6*  NEUTROABS  --   --   --   --  12.0*  HGB 8.7* 8.8* 8.5* 7.1* 8.7*  HCT 28.1* 28.4* 27.5* 22.4* 26.3*  MCV 75.5* 74.7* 73.5* 73.0* 74.1*  PLT 404* 392 387 356 300   Cardiac Enzymes: No results for input(s): CKTOTAL, CKMB, CKMBINDEX, TROPONINI in the last 168 hours. BNP: Invalid input(s): POCBNP CBG:  Recent Labs Lab 04/20/17 1228 04/20/17 1643 04/20/17 2144  04/21/17 0635 04/21/17 1205  GLUCAP 246* 83 82 140* 154*   D-Dimer No results for input(s): DDIMER in the last 72 hours. Hgb A1c No results for input(s): HGBA1C in the last 72 hours. Lipid Profile No results for input(s): CHOL, HDL, LDLCALC, TRIG, CHOLHDL, LDLDIRECT in the last 72 hours. Thyroid function studies No results for input(s): TSH, T4TOTAL, T3FREE, THYROIDAB in the last 72 hours.  Invalid input(s): FREET3 Anemia work up No results for input(s): VITAMINB12, FOLATE, FERRITIN, TIBC, IRON, RETICCTPCT in the last 72 hours. Urinalysis    Component Value Date/Time   COLORURINE YELLOW 03/15/2017 1253   APPEARANCEUR HAZY (A) 03/15/2017 1253   LABSPEC 1.017 03/15/2017 1253   PHURINE 5.0 03/15/2017 1253   GLUCOSEU NEGATIVE 03/15/2017 1253   HGBUR NEGATIVE 03/15/2017 1253   BILIRUBINUR NEGATIVE 03/15/2017 1253   KETONESUR NEGATIVE 03/15/2017 1253   PROTEINUR NEGATIVE 03/15/2017 1253   UROBILINOGEN 1.0 11/27/2014 0755   NITRITE NEGATIVE 03/15/2017 1253   LEUKOCYTESUR NEGATIVE 03/15/2017 1253   Sepsis Labs Invalid input(s): PROCALCITONIN,  WBC,  LACTICIDVEN Microbiology Recent Results (from the past 240 hour(s))  MRSA PCR Screening     Status: Abnormal   Collection Time: 04/13/17 11:42 AM  Result Value Ref Range Status   MRSA by PCR POSITIVE (A) NEGATIVE Final    Comment:        The GeneXpert MRSA Assay (FDA approved for NASAL specimens only), is one component of a comprehensive MRSA colonization surveillance program. It is not intended to diagnose MRSA infection nor to guide or monitor treatment for MRSA infections. RESULT CALLED TO, READ BACK BY  AND VERIFIED WITH: A. Mabe RN 13:10 04/13/17 ([email or web address])    Time coordinating discharge: 35 minutes  SIGNED:  Merlene Laughter, DO [name] 04/21/2017, 3:57 PM Pager [phone]  If 7PM-7AM, please contact night-coverage [email or web address] Password [email or web address]

## 2017-04-21 NOTE — Progress Notes (Signed)
[facility] here to transport pt. to [facility]; pt. alert and oriented x4; pt. med given recently.

## 2017-04-21 NOTE — Care Management Important Message (Signed)
Important Message  Patient Details  Name: [name] MRN: [id number] Date of Birth: 02-Sep-1950   Medicare Important Message Given:  Yes    Flint Hakeem, [name], RN 04/21/2017, 2:44 PM

## 2017-05-04 ENCOUNTER — Other Ambulatory Visit: Payer: Self-pay | Admitting: Endocrinology

## 2017-05-17 DIAGNOSIS — I480 Paroxysmal atrial fibrillation: Secondary | ICD-10-CM | POA: Diagnosis not present

## 2017-05-17 DIAGNOSIS — L97119 Non-pressure chronic ulcer of right thigh with unspecified severity: Secondary | ICD-10-CM | POA: Diagnosis not present

## 2017-05-17 DIAGNOSIS — G4733 Obstructive sleep apnea (adult) (pediatric): Secondary | ICD-10-CM | POA: Diagnosis not present

## 2017-05-17 DIAGNOSIS — R2681 Unsteadiness on feet: Secondary | ICD-10-CM | POA: Diagnosis not present

## 2017-05-17 DIAGNOSIS — M6281 Muscle weakness (generalized): Secondary | ICD-10-CM | POA: Diagnosis not present

## 2017-05-17 DIAGNOSIS — S31809A Unspecified open wound of unspecified buttock, initial encounter: Secondary | ICD-10-CM | POA: Diagnosis not present

## 2017-05-17 DIAGNOSIS — K219 Gastro-esophageal reflux disease without esophagitis: Secondary | ICD-10-CM | POA: Diagnosis not present

## 2017-05-17 DIAGNOSIS — L8992 Pressure ulcer of unspecified site, stage 2: Secondary | ICD-10-CM | POA: Diagnosis not present

## 2017-05-17 DIAGNOSIS — R5381 Other malaise: Secondary | ICD-10-CM | POA: Diagnosis not present

## 2017-05-17 DIAGNOSIS — I5030 Unspecified diastolic (congestive) heart failure: Secondary | ICD-10-CM | POA: Diagnosis not present

## 2017-05-17 DIAGNOSIS — L929 Granulomatous disorder of the skin and subcutaneous tissue, unspecified: Secondary | ICD-10-CM | POA: Diagnosis not present

## 2017-05-17 DIAGNOSIS — T8789 Other complications of amputation stump: Secondary | ICD-10-CM | POA: Diagnosis not present

## 2017-05-17 DIAGNOSIS — I739 Peripheral vascular disease, unspecified: Secondary | ICD-10-CM | POA: Diagnosis not present

## 2017-05-17 DIAGNOSIS — E1122 Type 2 diabetes mellitus with diabetic chronic kidney disease: Secondary | ICD-10-CM | POA: Diagnosis not present

## 2017-05-17 DIAGNOSIS — N492 Inflammatory disorders of scrotum: Secondary | ICD-10-CM | POA: Diagnosis not present

## 2017-05-17 DIAGNOSIS — K922 Gastrointestinal hemorrhage, unspecified: Secondary | ICD-10-CM | POA: Diagnosis not present

## 2017-05-17 DIAGNOSIS — Z89511 Acquired absence of right leg below knee: Secondary | ICD-10-CM | POA: Diagnosis not present

## 2017-05-17 DIAGNOSIS — Z9181 History of falling: Secondary | ICD-10-CM | POA: Diagnosis not present

## 2017-05-17 DIAGNOSIS — J9691 Respiratory failure, unspecified with hypoxia: Secondary | ICD-10-CM | POA: Diagnosis not present

## 2017-05-17 DIAGNOSIS — R2689 Other abnormalities of gait and mobility: Secondary | ICD-10-CM | POA: Diagnosis not present

## 2017-05-17 DIAGNOSIS — E1169 Type 2 diabetes mellitus with other specified complication: Secondary | ICD-10-CM | POA: Diagnosis not present

## 2017-05-17 DIAGNOSIS — E1165 Type 2 diabetes mellitus with hyperglycemia: Secondary | ICD-10-CM | POA: Diagnosis not present

## 2017-05-17 DIAGNOSIS — G894 Chronic pain syndrome: Secondary | ICD-10-CM | POA: Diagnosis not present

## 2017-05-17 DIAGNOSIS — E785 Hyperlipidemia, unspecified: Secondary | ICD-10-CM | POA: Diagnosis not present

## 2017-05-17 DIAGNOSIS — T8781 Dehiscence of amputation stump: Secondary | ICD-10-CM | POA: Diagnosis not present

## 2017-05-17 DIAGNOSIS — M868X6 Other osteomyelitis, lower leg: Secondary | ICD-10-CM | POA: Diagnosis not present

## 2017-05-17 DIAGNOSIS — L0390 Cellulitis, unspecified: Secondary | ICD-10-CM | POA: Diagnosis not present

## 2017-05-17 DIAGNOSIS — M545 Low back pain: Secondary | ICD-10-CM | POA: Diagnosis not present

## 2017-05-17 DIAGNOSIS — N183 Chronic kidney disease, stage 3 (moderate): Secondary | ICD-10-CM | POA: Diagnosis not present

## 2017-05-17 DIAGNOSIS — I2510 Atherosclerotic heart disease of native coronary artery without angina pectoris: Secondary | ICD-10-CM | POA: Diagnosis not present

## 2017-05-17 DIAGNOSIS — I10 Essential (primary) hypertension: Secondary | ICD-10-CM | POA: Diagnosis not present

## 2017-05-17 DIAGNOSIS — E11319 Type 2 diabetes mellitus with unspecified diabetic retinopathy without macular edema: Secondary | ICD-10-CM | POA: Diagnosis not present

## 2017-05-17 DIAGNOSIS — M549 Dorsalgia, unspecified: Secondary | ICD-10-CM | POA: Diagnosis not present

## 2017-05-17 DIAGNOSIS — L89154 Pressure ulcer of sacral region, stage 4: Secondary | ICD-10-CM | POA: Diagnosis not present

## 2017-05-20 DIAGNOSIS — Z89511 Acquired absence of right leg below knee: Secondary | ICD-10-CM | POA: Diagnosis not present

## 2017-05-20 DIAGNOSIS — T8789 Other complications of amputation stump: Secondary | ICD-10-CM | POA: Diagnosis not present

## 2017-05-20 DIAGNOSIS — E1122 Type 2 diabetes mellitus with diabetic chronic kidney disease: Secondary | ICD-10-CM | POA: Diagnosis not present

## 2017-05-20 DIAGNOSIS — L89154 Pressure ulcer of sacral region, stage 4: Secondary | ICD-10-CM | POA: Diagnosis not present

## 2017-05-25 ENCOUNTER — Other Ambulatory Visit: Payer: Self-pay | Admitting: *Deleted

## 2017-05-25 NOTE — Patient Outreach (Signed)
[facility]) Care Management  05/25/2017  [name] Mar 26, 1951 [id number]   Per [name], SW at facility patient will be LTC resident after rehab.   Plan to sign off, requested that [name] let RNCM know if plans changed, she agrees.  [name]. [name], RN, BSN, CCM  Post Acute Chartered loss adjuster [phone]) Business Cell  [phone]) Toll Free Office

## 2017-06-01 DIAGNOSIS — R5381 Other malaise: Secondary | ICD-10-CM | POA: Diagnosis not present

## 2017-06-01 DIAGNOSIS — M6281 Muscle weakness (generalized): Secondary | ICD-10-CM | POA: Diagnosis not present

## 2017-06-01 DIAGNOSIS — M545 Low back pain: Secondary | ICD-10-CM | POA: Diagnosis not present

## 2017-06-09 DIAGNOSIS — L97119 Non-pressure chronic ulcer of right thigh with unspecified severity: Secondary | ICD-10-CM | POA: Diagnosis not present

## 2017-06-16 DIAGNOSIS — L97119 Non-pressure chronic ulcer of right thigh with unspecified severity: Secondary | ICD-10-CM | POA: Diagnosis not present

## 2017-06-23 DIAGNOSIS — L97119 Non-pressure chronic ulcer of right thigh with unspecified severity: Secondary | ICD-10-CM | POA: Diagnosis not present

## 2017-06-30 DIAGNOSIS — L97119 Non-pressure chronic ulcer of right thigh with unspecified severity: Secondary | ICD-10-CM | POA: Diagnosis not present

## 2017-07-07 DIAGNOSIS — L89154 Pressure ulcer of sacral region, stage 4: Secondary | ICD-10-CM | POA: Diagnosis not present

## 2017-07-13 DIAGNOSIS — T8781 Dehiscence of amputation stump: Secondary | ICD-10-CM | POA: Diagnosis not present

## 2017-07-13 DIAGNOSIS — E1169 Type 2 diabetes mellitus with other specified complication: Secondary | ICD-10-CM | POA: Diagnosis not present

## 2017-07-13 DIAGNOSIS — M868X6 Other osteomyelitis, lower leg: Secondary | ICD-10-CM | POA: Diagnosis not present

## 2017-07-13 DIAGNOSIS — I10 Essential (primary) hypertension: Secondary | ICD-10-CM | POA: Diagnosis not present

## 2017-07-14 DIAGNOSIS — L89154 Pressure ulcer of sacral region, stage 4: Secondary | ICD-10-CM | POA: Diagnosis not present

## 2017-07-16 DIAGNOSIS — M6281 Muscle weakness (generalized): Secondary | ICD-10-CM | POA: Diagnosis not present

## 2017-07-16 DIAGNOSIS — M869 Osteomyelitis, unspecified: Secondary | ICD-10-CM | POA: Diagnosis not present

## 2017-07-16 DIAGNOSIS — L89150 Pressure ulcer of sacral region, unstageable: Secondary | ICD-10-CM | POA: Diagnosis not present

## 2017-07-16 DIAGNOSIS — M86161 Other acute osteomyelitis, right tibia and fibula: Secondary | ICD-10-CM | POA: Diagnosis not present

## 2017-07-16 DIAGNOSIS — I739 Peripheral vascular disease, unspecified: Secondary | ICD-10-CM | POA: Diagnosis not present

## 2017-07-16 DIAGNOSIS — M25532 Pain in left wrist: Secondary | ICD-10-CM | POA: Diagnosis not present

## 2017-07-16 DIAGNOSIS — Z89511 Acquired absence of right leg below knee: Secondary | ICD-10-CM | POA: Diagnosis not present

## 2017-07-16 DIAGNOSIS — E1165 Type 2 diabetes mellitus with hyperglycemia: Secondary | ICD-10-CM | POA: Diagnosis not present

## 2017-07-16 DIAGNOSIS — E119 Type 2 diabetes mellitus without complications: Secondary | ICD-10-CM | POA: Diagnosis not present

## 2017-07-16 DIAGNOSIS — L89159 Pressure ulcer of sacral region, unspecified stage: Secondary | ICD-10-CM | POA: Diagnosis not present

## 2017-07-16 DIAGNOSIS — R2689 Other abnormalities of gait and mobility: Secondary | ICD-10-CM | POA: Diagnosis not present

## 2017-07-16 DIAGNOSIS — R600 Localized edema: Secondary | ICD-10-CM | POA: Diagnosis not present

## 2017-07-16 DIAGNOSIS — Z792 Long term (current) use of antibiotics: Secondary | ICD-10-CM | POA: Diagnosis not present

## 2017-07-16 DIAGNOSIS — T8189XA Other complications of procedures, not elsewhere classified, initial encounter: Secondary | ICD-10-CM | POA: Diagnosis not present

## 2017-07-16 DIAGNOSIS — M8618 Other acute osteomyelitis, other site: Secondary | ICD-10-CM | POA: Diagnosis not present

## 2017-07-16 DIAGNOSIS — E1151 Type 2 diabetes mellitus with diabetic peripheral angiopathy without gangrene: Secondary | ICD-10-CM | POA: Diagnosis not present

## 2017-07-16 DIAGNOSIS — E1122 Type 2 diabetes mellitus with diabetic chronic kidney disease: Secondary | ICD-10-CM | POA: Diagnosis not present

## 2017-07-16 DIAGNOSIS — R5381 Other malaise: Secondary | ICD-10-CM | POA: Diagnosis not present

## 2017-07-16 DIAGNOSIS — E46 Unspecified protein-calorie malnutrition: Secondary | ICD-10-CM | POA: Diagnosis not present

## 2017-07-16 DIAGNOSIS — I4891 Unspecified atrial fibrillation: Secondary | ICD-10-CM | POA: Diagnosis not present

## 2017-07-16 DIAGNOSIS — S31809A Unspecified open wound of unspecified buttock, initial encounter: Secondary | ICD-10-CM | POA: Diagnosis not present

## 2017-07-16 DIAGNOSIS — G894 Chronic pain syndrome: Secondary | ICD-10-CM | POA: Diagnosis not present

## 2017-07-16 DIAGNOSIS — L8992 Pressure ulcer of unspecified site, stage 2: Secondary | ICD-10-CM | POA: Diagnosis not present

## 2017-07-16 DIAGNOSIS — D638 Anemia in other chronic diseases classified elsewhere: Secondary | ICD-10-CM | POA: Diagnosis not present

## 2017-07-16 DIAGNOSIS — M24561 Contracture, right knee: Secondary | ICD-10-CM | POA: Diagnosis not present

## 2017-07-16 DIAGNOSIS — N183 Chronic kidney disease, stage 3 (moderate): Secondary | ICD-10-CM | POA: Diagnosis not present

## 2017-07-16 DIAGNOSIS — I1310 Hypertensive heart and chronic kidney disease without heart failure, with stage 1 through stage 4 chronic kidney disease, or unspecified chronic kidney disease: Secondary | ICD-10-CM | POA: Diagnosis not present

## 2017-07-16 DIAGNOSIS — M8610 Other acute osteomyelitis, unspecified site: Secondary | ICD-10-CM | POA: Diagnosis not present

## 2017-07-16 DIAGNOSIS — Z87891 Personal history of nicotine dependence: Secondary | ICD-10-CM | POA: Diagnosis not present

## 2017-07-16 DIAGNOSIS — E1169 Type 2 diabetes mellitus with other specified complication: Secondary | ICD-10-CM | POA: Diagnosis not present

## 2017-07-16 DIAGNOSIS — G4733 Obstructive sleep apnea (adult) (pediatric): Secondary | ICD-10-CM | POA: Diagnosis not present

## 2017-07-16 DIAGNOSIS — S81801A Unspecified open wound, right lower leg, initial encounter: Secondary | ICD-10-CM | POA: Diagnosis not present

## 2017-08-11 DIAGNOSIS — L89154 Pressure ulcer of sacral region, stage 4: Secondary | ICD-10-CM | POA: Diagnosis not present

## 2017-08-18 DIAGNOSIS — L89154 Pressure ulcer of sacral region, stage 4: Secondary | ICD-10-CM | POA: Diagnosis not present

## 2017-08-25 DIAGNOSIS — L89154 Pressure ulcer of sacral region, stage 4: Secondary | ICD-10-CM | POA: Diagnosis not present

## 2017-08-26 DIAGNOSIS — G4733 Obstructive sleep apnea (adult) (pediatric): Secondary | ICD-10-CM | POA: Diagnosis not present

## 2017-08-26 DIAGNOSIS — E119 Type 2 diabetes mellitus without complications: Secondary | ICD-10-CM | POA: Diagnosis not present

## 2017-08-31 DIAGNOSIS — R05 Cough: Secondary | ICD-10-CM | POA: Diagnosis not present

## 2017-08-31 DIAGNOSIS — E119 Type 2 diabetes mellitus without complications: Secondary | ICD-10-CM | POA: Diagnosis not present

## 2017-08-31 DIAGNOSIS — R509 Fever, unspecified: Secondary | ICD-10-CM | POA: Diagnosis not present

## 2017-09-01 ENCOUNTER — Emergency Department (HOSPITAL_COMMUNITY): Payer: Medicare Other

## 2017-09-01 ENCOUNTER — Encounter (HOSPITAL_COMMUNITY): Payer: Self-pay | Admitting: Emergency Medicine

## 2017-09-01 ENCOUNTER — Inpatient Hospital Stay (HOSPITAL_COMMUNITY)
Admission: EM | Admit: 2017-09-01 | Discharge: 2017-09-13 | DRG: 871 | Disposition: A | Discharge status: Skilled Nursing Facility | Payer: Medicare Other | Attending: Internal Medicine | Admitting: Internal Medicine

## 2017-09-01 ENCOUNTER — Inpatient Hospital Stay (HOSPITAL_COMMUNITY): Payer: Medicare Other

## 2017-09-01 ENCOUNTER — Other Ambulatory Visit: Payer: Self-pay

## 2017-09-01 DIAGNOSIS — Z7189 Other specified counseling: Secondary | ICD-10-CM | POA: Diagnosis not present

## 2017-09-01 DIAGNOSIS — L0390 Cellulitis, unspecified: Secondary | ICD-10-CM

## 2017-09-01 DIAGNOSIS — L89304 Pressure ulcer of unspecified buttock, stage 4: Secondary | ICD-10-CM | POA: Diagnosis not present

## 2017-09-01 DIAGNOSIS — A419 Sepsis, unspecified organism: Secondary | ICD-10-CM

## 2017-09-01 DIAGNOSIS — I130 Hypertensive heart and chronic kidney disease with heart failure and stage 1 through stage 4 chronic kidney disease, or unspecified chronic kidney disease: Secondary | ICD-10-CM | POA: Diagnosis present

## 2017-09-01 DIAGNOSIS — I5032 Chronic diastolic (congestive) heart failure: Secondary | ICD-10-CM | POA: Diagnosis not present

## 2017-09-01 DIAGNOSIS — R000 Tachycardia, unspecified: Secondary | ICD-10-CM

## 2017-09-01 DIAGNOSIS — R7881 Bacteremia: Secondary | ICD-10-CM | POA: Diagnosis not present

## 2017-09-01 DIAGNOSIS — R0689 Other abnormalities of breathing: Secondary | ICD-10-CM

## 2017-09-01 DIAGNOSIS — I509 Heart failure, unspecified: Secondary | ICD-10-CM

## 2017-09-01 DIAGNOSIS — E1140 Type 2 diabetes mellitus with diabetic neuropathy, unspecified: Secondary | ICD-10-CM | POA: Diagnosis present

## 2017-09-01 DIAGNOSIS — I482 Chronic atrial fibrillation: Secondary | ICD-10-CM | POA: Diagnosis present

## 2017-09-01 DIAGNOSIS — D631 Anemia in chronic kidney disease: Secondary | ICD-10-CM | POA: Diagnosis not present

## 2017-09-01 DIAGNOSIS — R410 Disorientation, unspecified: Secondary | ICD-10-CM

## 2017-09-01 DIAGNOSIS — Z794 Long term (current) use of insulin: Secondary | ICD-10-CM

## 2017-09-01 DIAGNOSIS — N183 Chronic kidney disease, stage 3 unspecified: Secondary | ICD-10-CM | POA: Diagnosis present

## 2017-09-01 DIAGNOSIS — I2510 Atherosclerotic heart disease of native coronary artery without angina pectoris: Secondary | ICD-10-CM | POA: Diagnosis not present

## 2017-09-01 DIAGNOSIS — Z87891 Personal history of nicotine dependence: Secondary | ICD-10-CM | POA: Diagnosis not present

## 2017-09-01 DIAGNOSIS — R0989 Other specified symptoms and signs involving the circulatory and respiratory systems: Secondary | ICD-10-CM | POA: Diagnosis not present

## 2017-09-01 DIAGNOSIS — E785 Hyperlipidemia, unspecified: Secondary | ICD-10-CM | POA: Diagnosis present

## 2017-09-01 DIAGNOSIS — Z9861 Coronary angioplasty status: Secondary | ICD-10-CM | POA: Diagnosis not present

## 2017-09-01 DIAGNOSIS — N289 Disorder of kidney and ureter, unspecified: Secondary | ICD-10-CM

## 2017-09-01 DIAGNOSIS — E870 Hyperosmolality and hypernatremia: Secondary | ICD-10-CM | POA: Diagnosis present

## 2017-09-01 DIAGNOSIS — J9690 Respiratory failure, unspecified, unspecified whether with hypoxia or hypercapnia: Secondary | ICD-10-CM | POA: Diagnosis not present

## 2017-09-01 DIAGNOSIS — Z89611 Acquired absence of right leg above knee: Secondary | ICD-10-CM

## 2017-09-01 DIAGNOSIS — Z79899 Other long term (current) drug therapy: Secondary | ICD-10-CM

## 2017-09-01 DIAGNOSIS — N3001 Acute cystitis with hematuria: Secondary | ICD-10-CM | POA: Diagnosis not present

## 2017-09-01 DIAGNOSIS — N3000 Acute cystitis without hematuria: Secondary | ICD-10-CM | POA: Diagnosis not present

## 2017-09-01 DIAGNOSIS — D649 Anemia, unspecified: Secondary | ICD-10-CM | POA: Diagnosis present

## 2017-09-01 DIAGNOSIS — Z515 Encounter for palliative care: Secondary | ICD-10-CM

## 2017-09-01 DIAGNOSIS — R001 Bradycardia, unspecified: Secondary | ICD-10-CM | POA: Diagnosis not present

## 2017-09-01 DIAGNOSIS — E876 Hypokalemia: Secondary | ICD-10-CM | POA: Diagnosis present

## 2017-09-01 DIAGNOSIS — E860 Dehydration: Secondary | ICD-10-CM | POA: Diagnosis present

## 2017-09-01 DIAGNOSIS — D509 Iron deficiency anemia, unspecified: Secondary | ICD-10-CM | POA: Diagnosis present

## 2017-09-01 DIAGNOSIS — L89159 Pressure ulcer of sacral region, unspecified stage: Secondary | ICD-10-CM | POA: Diagnosis not present

## 2017-09-01 DIAGNOSIS — E872 Acidosis: Secondary | ICD-10-CM | POA: Diagnosis present

## 2017-09-01 DIAGNOSIS — N390 Urinary tract infection, site not specified: Secondary | ICD-10-CM | POA: Diagnosis present

## 2017-09-01 DIAGNOSIS — E66813 Obesity, class 3: Secondary | ICD-10-CM | POA: Diagnosis present

## 2017-09-01 DIAGNOSIS — A4151 Sepsis due to Escherichia coli [E. coli]: Secondary | ICD-10-CM | POA: Diagnosis not present

## 2017-09-01 DIAGNOSIS — Z6835 Body mass index (BMI) 35.0-35.9, adult: Secondary | ICD-10-CM | POA: Diagnosis not present

## 2017-09-01 DIAGNOSIS — A4189 Other specified sepsis: Principal | ICD-10-CM | POA: Diagnosis present

## 2017-09-01 DIAGNOSIS — R0902 Hypoxemia: Secondary | ICD-10-CM

## 2017-09-01 DIAGNOSIS — N179 Acute kidney failure, unspecified: Secondary | ICD-10-CM | POA: Diagnosis present

## 2017-09-01 DIAGNOSIS — M1990 Unspecified osteoarthritis, unspecified site: Secondary | ICD-10-CM | POA: Diagnosis present

## 2017-09-01 DIAGNOSIS — R197 Diarrhea, unspecified: Secondary | ICD-10-CM | POA: Diagnosis present

## 2017-09-01 DIAGNOSIS — Z7901 Long term (current) use of anticoagulants: Secondary | ICD-10-CM

## 2017-09-01 DIAGNOSIS — R0602 Shortness of breath: Secondary | ICD-10-CM | POA: Diagnosis not present

## 2017-09-01 DIAGNOSIS — I4891 Unspecified atrial fibrillation: Secondary | ICD-10-CM | POA: Diagnosis not present

## 2017-09-01 DIAGNOSIS — L089 Local infection of the skin and subcutaneous tissue, unspecified: Secondary | ICD-10-CM | POA: Diagnosis present

## 2017-09-01 DIAGNOSIS — G9341 Metabolic encephalopathy: Secondary | ICD-10-CM | POA: Diagnosis present

## 2017-09-01 DIAGNOSIS — J948 Other specified pleural conditions: Secondary | ICD-10-CM | POA: Diagnosis not present

## 2017-09-01 DIAGNOSIS — Z1612 Extended spectrum beta lactamase (ESBL) resistance: Secondary | ICD-10-CM | POA: Diagnosis present

## 2017-09-01 DIAGNOSIS — G4733 Obstructive sleep apnea (adult) (pediatric): Secondary | ICD-10-CM | POA: Diagnosis present

## 2017-09-01 DIAGNOSIS — G9340 Encephalopathy, unspecified: Secondary | ICD-10-CM

## 2017-09-01 DIAGNOSIS — E1122 Type 2 diabetes mellitus with diabetic chronic kidney disease: Secondary | ICD-10-CM | POA: Diagnosis present

## 2017-09-01 DIAGNOSIS — Z951 Presence of aortocoronary bypass graft: Secondary | ICD-10-CM

## 2017-09-01 DIAGNOSIS — R05 Cough: Secondary | ICD-10-CM | POA: Diagnosis not present

## 2017-09-01 DIAGNOSIS — L8990 Pressure ulcer of unspecified site, unspecified stage: Secondary | ICD-10-CM | POA: Diagnosis present

## 2017-09-01 DIAGNOSIS — R6520 Severe sepsis without septic shock: Secondary | ICD-10-CM | POA: Diagnosis not present

## 2017-09-01 DIAGNOSIS — R4020 Unspecified coma: Secondary | ICD-10-CM | POA: Diagnosis not present

## 2017-09-01 DIAGNOSIS — R0600 Dyspnea, unspecified: Secondary | ICD-10-CM

## 2017-09-01 DIAGNOSIS — Z66 Do not resuscitate: Secondary | ICD-10-CM | POA: Diagnosis not present

## 2017-09-01 DIAGNOSIS — T17908A Unspecified foreign body in respiratory tract, part unspecified causing other injury, initial encounter: Secondary | ICD-10-CM

## 2017-09-01 LAB — URINALYSIS, ROUTINE W REFLEX MICROSCOPIC
BILIRUBIN URINE: NEGATIVE
Glucose, UA: NEGATIVE mg/dL
Ketones, ur: NEGATIVE mg/dL
NITRITE: POSITIVE — AB
Protein, ur: 100 mg/dL — AB
SPECIFIC GRAVITY, URINE: 1.006 (ref 1.005–1.030)
pH: 7 (ref 5.0–8.0)

## 2017-09-01 LAB — BLOOD CULTURE ID PANEL (REFLEXED)
Acinetobacter baumannii: NOT DETECTED
CANDIDA GLABRATA: NOT DETECTED
CANDIDA KRUSEI: NOT DETECTED
CANDIDA TROPICALIS: NOT DETECTED
CARBAPENEM RESISTANCE: NOT DETECTED
Candida albicans: NOT DETECTED
Candida parapsilosis: NOT DETECTED
ENTEROCOCCUS SPECIES: NOT DETECTED
ESCHERICHIA COLI: DETECTED — AB
Enterobacter cloacae complex: NOT DETECTED
Enterobacteriaceae species: DETECTED — AB
Haemophilus influenzae: NOT DETECTED
KLEBSIELLA OXYTOCA: NOT DETECTED
Klebsiella pneumoniae: NOT DETECTED
Listeria monocytogenes: NOT DETECTED
Neisseria meningitidis: NOT DETECTED
PROTEUS SPECIES: NOT DETECTED
Pseudomonas aeruginosa: NOT DETECTED
SERRATIA MARCESCENS: NOT DETECTED
STAPHYLOCOCCUS AUREUS BCID: NOT DETECTED
STAPHYLOCOCCUS SPECIES: NOT DETECTED
Streptococcus agalactiae: NOT DETECTED
Streptococcus pneumoniae: NOT DETECTED
Streptococcus pyogenes: NOT DETECTED
Streptococcus species: NOT DETECTED

## 2017-09-01 LAB — CBC WITH DIFFERENTIAL/PLATELET
BASOS PCT: 0 %
Basophils Absolute: 0 10*3/uL (ref 0.0–0.1)
EOS PCT: 0 %
Eosinophils Absolute: 0 10*3/uL (ref 0.0–0.7)
HEMATOCRIT: 32.9 % — AB (ref 39.0–52.0)
HEMOGLOBIN: 9.7 g/dL — AB (ref 13.0–17.0)
LYMPHS PCT: 6 %
Lymphs Abs: 2.1 10*3/uL (ref 0.7–4.0)
MCH: 21.7 pg — ABNORMAL LOW (ref 26.0–34.0)
MCHC: 29.5 g/dL — ABNORMAL LOW (ref 30.0–36.0)
MCV: 73.4 fL — ABNORMAL LOW (ref 78.0–100.0)
Monocytes Absolute: 1.8 10*3/uL — ABNORMAL HIGH (ref 0.1–1.0)
Monocytes Relative: 5 %
NEUTROS PCT: 89 %
Neutro Abs: 31.6 10*3/uL — ABNORMAL HIGH (ref 1.7–7.7)
Platelets: 505 10*3/uL — ABNORMAL HIGH (ref 150–400)
RBC: 4.48 MIL/uL (ref 4.22–5.81)
RDW: 24.9 % — ABNORMAL HIGH (ref 11.5–15.5)
WBC: 35.5 10*3/uL — ABNORMAL HIGH (ref 4.0–10.5)

## 2017-09-01 LAB — LACTIC ACID, PLASMA: Lactic Acid, Venous: 3.2 mmol/L (ref 0.5–1.9)

## 2017-09-01 LAB — COMPREHENSIVE METABOLIC PANEL
ALBUMIN: 2 g/dL — AB (ref 3.5–5.0)
ALT: 12 U/L — AB (ref 17–63)
AST: 38 U/L (ref 15–41)
Alkaline Phosphatase: 132 U/L — ABNORMAL HIGH (ref 38–126)
Anion gap: 19 — ABNORMAL HIGH (ref 5–15)
BILIRUBIN TOTAL: 1.6 mg/dL — AB (ref 0.3–1.2)
BUN: 10 mg/dL (ref 6–20)
CHLORIDE: 107 mmol/L (ref 101–111)
CO2: 23 mmol/L (ref 22–32)
Calcium: 7.8 mg/dL — ABNORMAL LOW (ref 8.9–10.3)
Creatinine, Ser: 1.87 mg/dL — ABNORMAL HIGH (ref 0.61–1.24)
GFR calc Af Amer: 42 mL/min — ABNORMAL LOW (ref >60)
GFR calc non Af Amer: 36 mL/min — ABNORMAL LOW (ref >60)
GLUCOSE: 176 mg/dL — AB (ref 65–99)
POTASSIUM: 2.2 mmol/L — AB (ref 3.5–5.1)
Sodium: 149 mmol/L — ABNORMAL HIGH (ref 135–145)
TOTAL PROTEIN: 7.7 g/dL (ref 6.5–8.1)

## 2017-09-01 LAB — BRAIN NATRIURETIC PEPTIDE: B Natriuretic Peptide: 1401.6 pg/mL — ABNORMAL HIGH (ref 0.0–100.0)

## 2017-09-01 LAB — INFLUENZA PANEL BY PCR (TYPE A & B)
Influenza A By PCR: NEGATIVE
Influenza B By PCR: NEGATIVE

## 2017-09-01 LAB — GLUCOSE, CAPILLARY: GLUCOSE-CAPILLARY: 189 mg/dL — AB (ref 65–99)

## 2017-09-01 LAB — I-STAT CG4 LACTIC ACID, ED
Lactic Acid, Venous: 5.94 mmol/L (ref 0.5–1.9)
Lactic Acid, Venous: 3.09 mmol/L (ref 0.5–1.9)

## 2017-09-01 LAB — CBG MONITORING, ED
GLUCOSE-CAPILLARY: 148 mg/dL — AB (ref 65–99)
GLUCOSE-CAPILLARY: 153 mg/dL — AB (ref 65–99)
GLUCOSE-CAPILLARY: 167 mg/dL — AB (ref 65–99)
Glucose-Capillary: 131 mg/dL — ABNORMAL HIGH (ref 65–99)

## 2017-09-01 LAB — PROTIME-INR
INR: 1.49
Prothrombin Time: 17.9 seconds — ABNORMAL HIGH (ref 11.4–15.2)

## 2017-09-01 LAB — PROCALCITONIN: PROCALCITONIN: 1.56 ng/mL

## 2017-09-01 MED ORDER — ACETAMINOPHEN 325 MG PO TABS
650.0000 mg | ORAL_TABLET | Freq: Four times a day (QID) | ORAL | Status: DC | PRN
  Administered 2017-09-09 – 2017-09-12 (×3): 650 mg via ORAL
  Filled 2017-09-01 (×3): qty 2

## 2017-09-01 MED ORDER — PIPERACILLIN-TAZOBACTAM 3.375 G IVPB 30 MIN
3.3750 g | Freq: Once | INTRAVENOUS | Status: AC
  Administered 2017-09-01: 3.375 g via INTRAVENOUS
  Filled 2017-09-01: qty 50

## 2017-09-01 MED ORDER — SODIUM CHLORIDE 0.9 % IV BOLUS (SEPSIS)
1000.0000 mL | Freq: Once | INTRAVENOUS | Status: AC
  Administered 2017-09-01: 1000 mL via INTRAVENOUS

## 2017-09-01 MED ORDER — FUROSEMIDE 10 MG/ML IJ SOLN
40.0000 mg | Freq: Once | INTRAMUSCULAR | Status: AC
  Administered 2017-09-01: 40 mg via INTRAVENOUS
  Filled 2017-09-01: qty 4

## 2017-09-01 MED ORDER — MORPHINE SULFATE (PF) 2 MG/ML IV SOLN
2.0000 mg | Freq: Once | INTRAVENOUS | Status: AC
  Administered 2017-09-01: 2 mg via INTRAVENOUS
  Filled 2017-09-01: qty 1

## 2017-09-01 MED ORDER — METOPROLOL TARTRATE 5 MG/5ML IV SOLN
5.0000 mg | Freq: Three times a day (TID) | INTRAVENOUS | Status: DC
  Administered 2017-09-01 – 2017-09-04 (×10): 5 mg via INTRAVENOUS
  Filled 2017-09-01 (×10): qty 5

## 2017-09-01 MED ORDER — ONDANSETRON HCL 4 MG/2ML IJ SOLN
4.0000 mg | Freq: Four times a day (QID) | INTRAMUSCULAR | Status: DC | PRN
  Administered 2017-09-06: 4 mg via INTRAVENOUS
  Filled 2017-09-01: qty 2

## 2017-09-01 MED ORDER — POTASSIUM CHLORIDE 10 MEQ/100ML IV SOLN
10.0000 meq | INTRAVENOUS | Status: AC
  Administered 2017-09-01 (×5): 10 meq via INTRAVENOUS
  Filled 2017-09-01 (×5): qty 100

## 2017-09-01 MED ORDER — POTASSIUM CHLORIDE 10 MEQ/100ML IV SOLN: 10.0000 meq | INTRAVENOUS | Status: DC

## 2017-09-01 MED ORDER — SODIUM CHLORIDE 0.9 % IV SOLN
1.0000 g | INTRAVENOUS | Status: DC
  Administered 2017-09-01: 1 g via INTRAVENOUS
  Filled 2017-09-01 (×2): qty 10

## 2017-09-01 MED ORDER — ACETAMINOPHEN 650 MG RE SUPP
650.0000 mg | Freq: Four times a day (QID) | RECTAL | Status: DC | PRN
  Administered 2017-09-01 – 2017-09-02 (×3): 650 mg via RECTAL
  Filled 2017-09-01 (×3): qty 1

## 2017-09-01 MED ORDER — INSULIN ASPART 100 UNIT/ML ~~LOC~~ SOLN
0.0000 [IU] | Freq: Three times a day (TID) | SUBCUTANEOUS | Status: DC
  Administered 2017-09-01: 2 [IU] via SUBCUTANEOUS
  Administered 2017-09-01 – 2017-09-02 (×2): 1 [IU] via SUBCUTANEOUS
  Administered 2017-09-02 – 2017-09-03 (×5): 2 [IU] via SUBCUTANEOUS
  Administered 2017-09-04: 1 [IU] via SUBCUTANEOUS
  Administered 2017-09-04 – 2017-09-05 (×3): 3 [IU] via SUBCUTANEOUS
  Administered 2017-09-05: 2 [IU] via SUBCUTANEOUS
  Administered 2017-09-05: 3 [IU] via SUBCUTANEOUS
  Administered 2017-09-06: 1 [IU] via SUBCUTANEOUS
  Administered 2017-09-06: 2 [IU] via SUBCUTANEOUS
  Administered 2017-09-06: 3 [IU] via SUBCUTANEOUS
  Administered 2017-09-07: 2 [IU] via SUBCUTANEOUS
  Administered 2017-09-07: 3 [IU] via SUBCUTANEOUS
  Administered 2017-09-07 – 2017-09-08 (×3): 2 [IU] via SUBCUTANEOUS
  Administered 2017-09-09: 1 [IU] via SUBCUTANEOUS
  Administered 2017-09-09: 2 [IU] via SUBCUTANEOUS
  Administered 2017-09-09 – 2017-09-10 (×2): 1 [IU] via SUBCUTANEOUS
  Administered 2017-09-10 – 2017-09-11 (×5): 2 [IU] via SUBCUTANEOUS
  Administered 2017-09-12: 1 [IU] via SUBCUTANEOUS
  Administered 2017-09-12: 2 [IU] via SUBCUTANEOUS
  Administered 2017-09-12: 3 [IU] via SUBCUTANEOUS
  Administered 2017-09-13 (×2): 2 [IU] via SUBCUTANEOUS
  Filled 2017-09-01 (×2): qty 1

## 2017-09-01 MED ORDER — VANCOMYCIN HCL 10 G IV SOLR
2000.0000 mg | Freq: Once | INTRAVENOUS | Status: AC
  Administered 2017-09-01: 2000 mg via INTRAVENOUS
  Filled 2017-09-01: qty 2000

## 2017-09-01 MED ORDER — VANCOMYCIN HCL IN DEXTROSE 1-5 GM/200ML-% IV SOLN: 1000.0000 mg | Freq: Once | INTRAVENOUS | Status: DC

## 2017-09-01 MED ORDER — ACETAMINOPHEN 650 MG RE SUPP
650.0000 mg | Freq: Once | RECTAL | Status: AC
  Administered 2017-09-01: 650 mg via RECTAL
  Filled 2017-09-01: qty 1

## 2017-09-01 MED ORDER — ENOXAPARIN SODIUM 40 MG/0.4ML ~~LOC~~ SOLN
40.0000 mg | Freq: Every day | SUBCUTANEOUS | Status: DC
  Administered 2017-09-02 – 2017-09-03 (×2): 40 mg via SUBCUTANEOUS
  Filled 2017-09-01 (×3): qty 0.4

## 2017-09-01 MED ORDER — ONDANSETRON HCL 4 MG PO TABS: 4.0000 mg | ORAL_TABLET | Freq: Four times a day (QID) | ORAL | Status: DC | PRN

## 2017-09-01 MED ORDER — INSULIN DETEMIR 100 UNIT/ML ~~LOC~~ SOLN
5.0000 [IU] | Freq: Two times a day (BID) | SUBCUTANEOUS | Status: DC
  Administered 2017-09-01 – 2017-09-04 (×7): 5 [IU] via SUBCUTANEOUS
  Filled 2017-09-01 (×10): qty 0.05

## 2017-09-01 MED ORDER — MAGNESIUM SULFATE IN D5W 1-5 GM/100ML-% IV SOLN
1.0000 g | Freq: Once | INTRAVENOUS | Status: AC
  Administered 2017-09-01: 1 g via INTRAVENOUS
  Filled 2017-09-01: qty 100

## 2017-09-01 MED ORDER — ASPIRIN 300 MG RE SUPP
300.0000 mg | Freq: Every day | RECTAL | Status: DC
  Administered 2017-09-01 – 2017-09-03 (×3): 300 mg via RECTAL
  Filled 2017-09-01 (×3): qty 1

## 2017-09-01 MED ORDER — HYDRALAZINE HCL 20 MG/ML IJ SOLN: 5.0000 mg | INTRAMUSCULAR | Status: DC | PRN

## 2017-09-01 MED ORDER — POTASSIUM CHLORIDE 10 MEQ/100ML IV SOLN
10.0000 meq | Freq: Once | INTRAVENOUS | Status: AC
  Administered 2017-09-01: 10 meq via INTRAVENOUS
  Filled 2017-09-01: qty 100

## 2017-09-01 MED ORDER — SODIUM CHLORIDE 0.9 % IV BOLUS (SEPSIS)
1000.0000 mL | Freq: Once | INTRAVENOUS | Status: AC
Start: 2017-09-01 — End: 2017-09-01
  Administered 2017-09-01: 1000 mL via INTRAVENOUS

## 2017-09-01 NOTE — ED Triage Notes (Signed)
Patient from [facility], came into ED due to fever.  Patient was given 650mg  APAP rectally at 2300 for his fever, hyperglycemic and altered.  He had lower sats with first response, sugar of 239.  Patient has staples in his right stump and they are red and hot to touch with drainage from the wounds.  Patient is unable to speak, moans and coughs.

## 2017-09-01 NOTE — ED Notes (Signed)
Pt continues to moan/groan. Pt pulling at wires. All extra wires removed at this time.

## 2017-09-01 NOTE — ED Notes (Signed)
Pt.has been move to special air mattress  Bed .

## 2017-09-01 NOTE — Consult Note (Signed)
WOC Nurse wound consult note Reason for Consult: sacral pressure injury Wound type: Stage 4 Pressure injury; sacrum Pressure Injury POA: Yes Measurement:8cm x 4cm x 0.2cm  Wound UGQ:BVQX, non granular, pink, non healing Drainage (amount, consistency, odor) minimal, no odor Periwound:intact, epibole of wound edges  Dressing procedure/placement/frequency: Daily hydrogel dressing, cover with dry dressing. LALM (low air loss mattress) for pressure redistribution and moisture management Maximize nutrition for wound healing.   No family in the room, patient unable to answer questions or converse with WOC nurse during my assessment, only grunts and moans.     Discussed POC bedside nurse.  Re consult if needed, will not follow at this time. Thanks  Madalee Altmann [employer], RN,CWOCN, CNS, CWON-AP [phone])

## 2017-09-01 NOTE — Progress Notes (Signed)
[facility]   Patient admitted after midnight see H&P for full details  Patient has been seen and examined, chart was reviewed.   67 y/o M who is admitted with AMS due to suspected sepsis from UTI. Patient on empiric abx. Blood and urine cultures pending. Patient receive aggressive hydration and now cxr with signs of increase in vascular congestion. Patient oriented to self only. No signs of respiratory distress at this time.   Will continue current plan and will add one time dose of Lasix 40 mg IV. Continue to monitor, will de-escalate abx pending cultures.   Latrelle Dodrill, MD

## 2017-09-01 NOTE — ED Notes (Signed)
Healing wound to left knee; AKA - staples still in place. Large wound to sacral area, cleaned and covered with absorbent dressing. I/O cath performed - urine was pus-like and malodorous.

## 2017-09-01 NOTE — ED Notes (Signed)
Code sepsis activated RN Inetta Fermo aware

## 2017-09-01 NOTE — ED Provider Notes (Signed)
[facility] EMERGENCY DEPARTMENT Provider Note   CSN: [id number] Arrival date & time: 09/01/17  0224   History   Chief Complaint Chief Complaint  Patient presents with  . Fever  . Altered Mental Status   Level 5 caveat due to altered mental status. HPI [name] is a 67 y.o. male.  The history is provided by the EMS personnel. The history is limited by the condition of the patient.  Fever   This is a new problem. The current episode started yesterday. The problem occurs constantly. The problem has been gradually worsening. He has tried acetaminophen for the symptoms.  Altered Mental Status     Patient with history of CAD/CHF/chronic kidney disease presents with altered mental status and fever.  Per EMS report, patient was found to have a fever yesterday and has been acting confused.  No other details are known at this time. Patient does stay at a nursing facility. Past Medical History:  Diagnosis Date  . Arthritis    "hands and lower back" (09/19/2014)  . CAD (coronary artery disease)    a. s/p PCI to RCA in 2012. b. prior cath in 01/2014 with elevated L/RH pressures, mild-mod CAD of LAD/LCx with patent RCA, normal EF, c/b CIN/CHF.  [name] Carpal tunnel syndrome, bilateral   . Cellulitis   . Chronic diastolic CHF (congestive heart failure) (HCC)   . Chronic kidney disease (CKD), stage III (moderate) (HCC)   . Chronic lower back pain   . Diabetes (HCC)   . DKA (diabetic ketoacidoses) (HCC) 03/2017  . History of blood transfusion ~ 1954   "related to OR"  . Hyperlipidemia   . Hypertension   . Hypoxia    a. Qualified for home O2 at [facility] in 09/2014.  [name] Lower GI bleed   . Microcytic anemia   . Morbid obesity (HCC)   . Neuropathy   . OSA (obstructive sleep apnea)    "I wear nasal prongs; haven't been using prongs recently" (09/19/2014)  . PAF (paroxysmal atrial fibrillation) (HCC)    TEE DCCV 09/23/2014  . Physical deconditioning   . Pilonidal cyst 1980's;  01/25/2013  . Scrotal abscess   . Type II diabetes mellitus [facility])     Patient Active Problem List   Diagnosis Date Noted  . Hyponatremia 04/11/2017  . ARF (acute renal failure) (HCC) 04/11/2017  . Pressure injury of skin 03/24/2017  . Below knee amputation status, right (HCC) 03/23/2017  . DKA (diabetic ketoacidoses) (HCC) 03/14/2017  . Elevated troponin 03/14/2017  . Diabetic polyneuropathy associated with type 2 diabetes mellitus (HCC)   . Osteoarthritis of both knees   . Diabetic nephropathy associated with type 2 diabetes mellitus (HCC)   . Bilateral lower leg cellulitis 01/22/2017  . Venous stasis 08/06/2016  . At high risk for falls 07/21/2016  . CHF exacerbation (HCC) 06/10/2016  . Lactic acidosis 06/09/2016  . Syncope and collapse 03/05/2016  . Acute on chronic renal insufficiency 03/05/2016  . Obesity 12/30/2015  . Scrotal swelling   . Foot fracture   . 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 Lower GI bleed   . Pressure ulcer 12/24/2015  . Acute kidney injury (HCC) 12/23/2015  . Back pain 12/23/2015  . Fall   . Metatarsal fracture   . Traumatic rhabdomyolysis (HCC)   . Hyperkalemia   . Lisfranc dislocation   . Hyperglycemia   . Sepsis, unspecified organism (HCC) 09/08/2015  . Scrotal wall abscess 09/08/2015  .  AKI (acute kidney injury) (HCC) 09/08/2015  . Chest wall pain   . On amiodarone therapy 12/08/2014  . Respiratory failure with hypoxia (HCC) 12/04/2014  . Hypoxia 12/04/2014  . Acute on chronic respiratory failure with hypoxia (HCC)   . Atrial fibrillation with RVR (HCC) 09/20/2014  . Acute on chronic diastolic CHF (congestive heart failure) (HCC) 09/19/2014  . Acute respiratory failure (HCC) 09/19/2014  . Abnormal nuclear cardiac imaging test 02/12/2014  . Dyspnea 02/12/2014  . Chronic anticoagulation- Coumadin 02/12/2014  . CKD (chronic kidney disease) stage 3, GFR 30-59 ml/min (HCC) 02/12/2014  . Chronic diastolic congestive heart  failure (HCC) 02/11/2014  . Encounter for therapeutic drug monitoring 08/03/2013  . Pilonidal cyst 01/25/2013  . Perirectal cellulitis 09/06/2012  . Long term (current) use of anticoagulants 11/18/2011  . Obstructive sleep apnea- unable to tolerate c-pap 08/04/2011  . CAD S/P percutaneous coronary angioplasty 08/04/2011  . ANEMIA-IRON DEFICIENCY 01/06/2009  . OTHER AND UNSPECIFIED COAGULATION DEFECTS 01/06/2009  . ANEMIA 01/02/2009  . CARDIOVASCULAR FUNCTION STUDY, ABNORMAL 01/02/2009  . Essential hypertension, benign 10/22/2008  . Atrial fibrillation 10/22/2008  . Hyperlipidemia 09/23/2008  . HYPOKALEMIA 09/23/2008  . Obesity, Class III, BMI 40-49.9 (morbid obesity) (HCC) 09/23/2008  . CELLULITIS AND ABSCESS OF LEG EXCEPT FOOT 09/23/2008    Past Surgical History:  Procedure Laterality Date  . ABDOMINAL SURGERY  ~ 1954   BENIGN TUMOR REMOVED  . AMPUTATION Right 03/23/2017   Procedure: RIGHT BELOW KNEE AMPUTATION;  Surgeon: Nadara Mustard, MD;  Location: [facility] OR;  Service: Orthopedics;  Laterality: Right;  . APPENDECTOMY    . BELOW KNEE LEG AMPUTATION Right 03/23/2017  . CARDIOVERSION  2010   [name] 09/19/2014  . CARDIOVERSION N/A 09/23/2014   Procedure: CARDIOVERSION;  Surgeon: Lewayne Bunting, MD;  Location: [facility] ENDOSCOPY;  Service: Cardiovascular;  Laterality: N/A;  . CARDIOVERSION N/A 12/11/2014   Procedure: CARDIOVERSION;  Surgeon: Thurmon Fair, MD;  Location: [facility] ENDOSCOPY;  Service: Cardiovascular;  Laterality: N/A;  . CATARACT EXTRACTION W/PHACO Right 11/15/2012   Procedure: CATARACT EXTRACTION PHACO AND INTRAOCULAR LENS PLACEMENT (IOC);  Surgeon: Shade Flood, MD;  Location: [facility] OR;  Service: Ophthalmology;  Laterality: Right;  . CATARACT EXTRACTION W/PHACO Left 11/29/2012   Procedure: CATARACT EXTRACTION PHACO AND INTRAOCULAR LENS PLACEMENT (IOC);  Surgeon: Shade Flood, MD;  Location: [facility] OR;  Service: Ophthalmology;  Laterality: Left;  . CORONARY ANGIOPLASTY WITH STENT PLACEMENT   August 2012   RCA DES - [facility]  . DEBRIDEMENT  FOOT Left    debriding diabetic foot ulcers  . EYE SURGERY    . FOREIGN BODY REMOVAL Right 2014   heel,  splinter removal   . LEFT AND RIGHT HEART CATHETERIZATION WITH CORONARY ANGIOGRAM N/A 01/31/2014   Procedure: LEFT AND RIGHT HEART CATHETERIZATION WITH CORONARY ANGIOGRAM;  Surgeon: Micheline Chapman, MD;  Location: [facility] CATH LAB;  Service: Cardiovascular;  Laterality: N/A;  . PARS PLANA VITRECTOMY Left 06/05/2013   Procedure: PARS PLANA VITRECTOMY WITH 23 GAUGE with Endolaser(constellation);  Surgeon: Shade Flood, MD;  Location: [facility] OR;  Service: Ophthalmology;  Laterality: Left;  . PILONIDAL CYST EXCISION N/A 01/08/2013   Procedure: CYST EXCISION PILONIDAL EXTENSIVE;  Surgeon: Axel Filler, MD;  Location: [facility] OR;  Service: General;  Laterality: N/A;  . PILONIDAL CYST EXCISION  1980's   "in [place]"  . TEE WITHOUT CARDIOVERSION N/A 09/23/2014   Procedure: TRANSESOPHAGEAL ECHOCARDIOGRAM (TEE);  Surgeon: Lewayne Bunting, MD;  Location: [facility] ENDOSCOPY;  Service: Cardiovascular;  Laterality:  N/A;  . TONSILLECTOMY         Home Medications    Prior to Admission medications   Medication Sig Start Date End Date Taking? Authorizing Provider  acetaminophen (TYLENOL) 500 MG tablet Take 500 mg by mouth 3 (three) times daily.     [provider]  Amino Acids-Protein Hydrolys (FEEDING SUPPLEMENT, PRO-STAT SUGAR FREE 64,) LIQD Take 30 mLs by mouth 3 (three) times daily with meals. 04/21/17   Marguerita Merles Latif, DO  aspirin EC 81 MG EC tablet Take 1 tablet (81 mg total) by mouth daily. 06/17/16   Richarda Overlie, MD  atorvastatin (LIPITOR) 80 MG tablet Take 1 tablet (80 mg total) by mouth daily. Patient taking differently: Take 80 mg by mouth at bedtime.  05/25/16   [name], MD  collagenase (SANTYL) ointment Apply 1 application topically daily.    [provider]  diltiazem (CARDIZEM CD) 240 MG 24  hr capsule Take 1 capsule (240 mg total) by mouth daily. 03/18/17   Calvert Cantor, MD  docusate sodium (COLACE) 100 MG capsule Take 1 capsule (100 mg total) by mouth daily. 01/26/17   [name], MD  feeding supplement, GLUCERNA SHAKE, (GLUCERNA SHAKE) LIQD Take 237 mLs by mouth 3 (three) times daily between meals. 04/21/17   Sheikh, Kateri Mc Latif, DO  gabapentin (NEURONTIN) 300 MG capsule Take 2 capsules (600 mg total) by mouth 2 (two) times daily. 05/25/16   Langeland, Kathaleen Grinder, MD  hydrocerin (EUCERIN) CREA Apply 1 application topically 3 (three) times daily. legs 05/25/16   Langeland, Alvis Lemmings T, MD  insulin aspart (NOVOLOG) 100 UNIT/ML injection Inject 100 Units into the skin 3 (three) times daily after meals.       insulin glargine (LANTUS) 100 UNIT/ML injection Inject 0.9 mLs (90 Units total) into the skin daily. 04/22/17   Sheikh, Omair Latif, DO  insulin glargine (LANTUS) 100 UNIT/ML injection Inject 0.1 mLs (10 Units total) into the skin at bedtime. 04/21/17   Sheikh, Omair Latif, DO  LANTUS 100 UNIT/ML injection INJECT 50 UNITS INTO THE SKIN 2 TIMES DAILY. 05/04/17   Reather Littler, MD  lidocaine (LMX) 4 % cream Apply topically daily as needed (during hydro). 04/21/17   Marguerita Merles Latif, DO  metoprolol tartrate (LOPRESSOR) 50 MG tablet Take 1 tablet (50 mg total) by mouth 2 (two) times daily. 01/27/17   Quentin Angst, MD  Multiple Vitamin (MULTIVITAMIN WITH MINERALS) TABS Take 1 tablet by mouth daily.    [provider]  oxyCODONE (ROXICODONE) 15 MG immediate release tablet Take 1 tablet (15 mg total) by mouth every 4 (four) hours as needed for pain. Hold for Sedation 03/25/17   Nadara Mustard, MD  OXYGEN Inhale 2-3 L into the lungs continuous.    [provider]  polyethylene glycol (MIRALAX / GLYCOLAX) packet Take 17 g by mouth 2 (two) times daily. 04/21/17   Marguerita Merles Latif, DO  potassium chloride SA (K-DUR,KLOR-CON) 20 MEQ tablet Take 40 mEq by mouth 2 (two) times  daily.    [provider]  senna-docusate (SENOKOT-S) 8.6-50 MG tablet Take 1 tablet by mouth 2 (two) times daily. 04/21/17   Marguerita Merles Latif, DO  silver nitrate applicators 75-25 % applicator Apply 10 Sticks topically as needed for bleeding. 04/21/17   Sheikh, Omair Latif, DO  XARELTO 20 MG TABS tablet TAKE 1 TABLET BY MOUTH EVERY DAY WITH SUPPER 04/05/17   Quentin Angst, MD    Family History Family History  Adopted:  Yes  Problem Relation Age of Onset  . Other Other        PT ADOPTED    Social History Social History   Tobacco Use  . Smoking status: Former Smoker    Packs/day: 1.00    Years: 20.00    Pack years: 20.00    Types: Cigarettes    Last attempt to quit: 07/13/1983    Years since quitting: 34.1  . Smokeless tobacco: Never Used  Substance Use Topics  . Alcohol use: No  . Drug use: No     Allergies   Patient has no known allergies.   Review of Systems Review of Systems  Unable to perform ROS: Mental status change  Constitutional: Positive for fever.     Physical Exam Updated Vital Signs BP (!) 116/59 (BP Location: Right Arm)   Pulse (!) 130   Temp (!) 103.8 F (39.9 C) (Rectal)   Resp (!) 30   Wt 127 kg (280 lb)   SpO2 95%   BMI 35.00 kg/m   Physical Exam CONSTITUTIONAL: Elderly, chronically ill-appearing, confused  HEAD: Normocephalic/atraumatic EYES: EOMI/PERRL ENMT: Mucous membranes dry NECK: supple no meningeal signs SPINE/BACK:entire spine nontender CV: Tachycardic LUNGS: Tachypnea, coarse breath sounds bilaterally ABDOMEN: soft, nontender, obese GU: Normal appearance, no erythema noted, nurses present for exam NEURO: Pt is awake/alert but confused, patient moaning and not following commands EXTREMITIES: Right AKA stump noted clean dry and intact SKIN: warm, see photo PSYCH: Unable to assess      ED Treatments / Results  Labs (all labs ordered are listed, but only abnormal results are displayed) Labs Reviewed    COMPREHENSIVE METABOLIC PANEL - Abnormal; Notable for the following components:      Result Value   Sodium 149 (*)    Potassium 2.2 (*)    Glucose, Bld 176 (*)    Creatinine, Ser 1.87 (*)    Calcium 7.8 (*)    Albumin 2.0 (*)    ALT 12 (*)    Alkaline Phosphatase 132 (*)    Total Bilirubin 1.6 (*)    GFR calc non Af Amer 36 (*)    GFR calc Af Amer 42 (*)    Anion gap 19 (*)    All other components within normal limits  CBC WITH DIFFERENTIAL/PLATELET - Abnormal; Notable for the following components:   WBC 35.5 (*)    Hemoglobin 9.7 (*)    HCT 32.9 (*)    MCV 73.4 (*)    MCH 21.7 (*)    MCHC 29.5 (*)    RDW 24.9 (*)    Platelets 505 (*)    Neutro Abs 31.6 (*)    Monocytes Absolute 1.8 (*)    All other components within normal limits  PROTIME-INR - Abnormal; Notable for the following components:   Prothrombin Time 17.9 (*)    All other components within normal limits  URINALYSIS, ROUTINE W REFLEX MICROSCOPIC - Abnormal; Notable for the following components:   Color, Urine AMBER (*)    APPearance CLOUDY (*)    Hgb urine dipstick MODERATE (*)    Protein, ur 100 (*)    Nitrite POSITIVE (*)    Leukocytes, UA LARGE (*)    Bacteria, UA MANY (*)    Squamous Epithelial / LPF 0-5 (*)    All other components within normal limits  I-STAT CG4 LACTIC ACID, ED - Abnormal; Notable for the following components:   Lactic Acid, Venous 5.94 (*)    All other components within  normal limits  I-STAT CG4 LACTIC ACID, ED - Abnormal; Notable for the following components:   Lactic Acid, Venous 3.09 (*)    All other components within normal limits  CULTURE, BLOOD (ROUTINE X 2)  CULTURE, BLOOD (ROUTINE X 2)  URINE CULTURE  INFLUENZA PANEL BY PCR (TYPE A & B)    EKG  EKG Interpretation  Date/Time:  Thursday September 01 2017 02:59:21 EST Ventricular Rate:  127 PR Interval:    QRS Duration: 103 QT Interval:  371 QTC Calculation: 518 R Axis:   -51 Text Interpretation:  Atrial  fibrillation Multiform ventricular premature complexes Left anterior fascicular block Consider anterior infarct Nonspecific T abnormalities, lateral leads Prolonged QT interval Abnormal ekg Confirmed by Zadie Rhine ([id number]) on 09/01/2017 3:08:32 AM       Radiology Dg Chest Portable 1 View  Result Date: 09/01/2017 CLINICAL DATA:  Code sepsis. EXAM: PORTABLE CHEST 1 VIEW COMPARISON:  CT chest radiograph September 01, 2017 at 0231 hours FINDINGS: Cardiac silhouette is mildly enlarged even with consideration to this low inspiratory portable examination with crowded vascular markings. Calcified aortic knob. Pulmonary vascular congestion without pleural effusion or focal consolidation. No pneumothorax. Bandlike density LEFT lung base. Soft tissue planes and included osseous structures are unchanged. IMPRESSION: Cardiomegaly and pulmonary vascular congestion with LEFT lung base atelectasis/scarring. Aortic Atherosclerosis (ICD10-I70.0). Electronically Signed   By: Awilda Metro M.D.   On: 09/01/2017 04:46   Dg Chest Portable 1 View  Result Date: 09/01/2017 CLINICAL DATA:  Cough and fever EXAM: PORTABLE CHEST 1 VIEW COMPARISON:  03/14/2017 FINDINGS: Cardiomegaly with mild central congestion. Low lung volumes. Linear atelectasis in the left lower lung. No pleural effusion or focal consolidation. Aortic atherosclerosis. No pneumothorax. IMPRESSION: 1. Cardiomegaly with vascular congestion and atelectasis in the left lower lung 2. No focal pulmonary infiltrate is seen Electronically Signed   By: Jasmine Pang M.D.   On: 09/01/2017 03:08    Procedures Procedures   CRITICAL CARE Performed by: Joya Gaskins Total critical care time: 40 minutes Critical care time was exclusive of separately billable procedures and treating other patients. Critical care was necessary to treat or prevent imminent or life-threatening deterioration. Critical care was time spent personally by me on the following  activities: development of treatment plan with patient and/or surrogate as well as nursing, discussions with consultants, evaluation of patient's response to treatment, examination of patient, obtaining history from patient or surrogate, ordering and performing treatments and interventions, ordering and review of laboratory studies, ordering and review of radiographic studies, pulse oximetry and re-evaluation of patient's condition.   Medications Ordered in ED Medications  vancomycin (VANCOCIN) 2,000 mg in sodium chloride 0.9 % 500 mL IVPB (2,000 mg Intravenous New Bag/Given 09/01/17 0352)  potassium chloride 10 mEq in 100 mL IVPB (not administered)  potassium chloride 10 mEq in 100 mL IVPB (not administered)  magnesium sulfate IVPB 1 g 100 mL (not administered)  sodium chloride 0.9 % bolus 1,000 mL (0 mLs Intravenous Stopped 09/01/17 0345)    And  sodium chloride 0.9 % bolus 1,000 mL (0 mLs Intravenous Stopped 09/01/17 0345)    And  sodium chloride 0.9 % bolus 1,000 mL (0 mLs Intravenous Stopped 09/01/17 0454)    And  sodium chloride 0.9 % bolus 1,000 mL (0 mLs Intravenous Stopped 09/01/17 0454)  piperacillin-tazobactam (ZOSYN) IVPB 3.375 g (0 g Intravenous Stopped 09/01/17 0446)  acetaminophen (TYLENOL) suppository 650 mg (650 mg Rectal Given 09/01/17 0345)     Initial Impression / Assessment  and Plan / ED Course  I have reviewed the triage vital signs and the nursing notes.  Pertinent labs & imaging results that were available during my care of the patient were reviewed by me and considered in my medical decision making (see chart for details).     2:55 AM Patient from nursing facility for fever and altered mental status.  Sepsis workup has been initiated. 3:37 AM Vitals are beginning to improve.  Mental status appears to be improving.  He is found to have a UTI.  He would need to be admitted.  IV fluids and IV antibiotics are infusing 5:11 AM Patient with some improvement of his vitals.   Repeat chest x-ray does not reveal any pulmonary edema.  His lactate is improving.  He is noted to be hypokalemic, IV potassium ordered He continues to cough, and is still not following commands.  He will be admitted to the hospital  5:18 AM Discussed with Dr. Clyde Lundborg for admission Final Clinical Impressions(s) / ED Diagnoses   Final diagnoses:  Sepsis, due to unspecified organism [facility])  Hypokalemia  Delirium  Renal insufficiency  Atrial fibrillation with rapid ventricular response (HCC)  Acute cystitis with hematuria    ED Discharge Orders    None       Zadie Rhine, MD 09/01/17 [phone]

## 2017-09-01 NOTE — ED Notes (Signed)
Notified pharmacy for levemir

## 2017-09-01 NOTE — Progress Notes (Signed)
PHARMACY - PHYSICIAN COMMUNICATION CRITICAL VALUE ALERT - BLOOD CULTURE IDENTIFICATION (BCID)  Results for orders placed or performed during the hospital encounter of 09/01/17  Blood Culture ID Panel (Reflexed) (Collected: 09/01/2017  2:42 AM)  Result Value Ref Range   Enterococcus species NOT DETECTED NOT DETECTED   Listeria monocytogenes NOT DETECTED NOT DETECTED   Staphylococcus species NOT DETECTED NOT DETECTED   Staphylococcus aureus NOT DETECTED NOT DETECTED   Streptococcus species NOT DETECTED NOT DETECTED   Streptococcus agalactiae NOT DETECTED NOT DETECTED   Streptococcus pneumoniae NOT DETECTED NOT DETECTED   Streptococcus pyogenes NOT DETECTED NOT DETECTED   Acinetobacter baumannii NOT DETECTED NOT DETECTED   Enterobacteriaceae species DETECTED (A) NOT DETECTED   Enterobacter cloacae complex NOT DETECTED NOT DETECTED   Escherichia coli DETECTED (A) NOT DETECTED   Klebsiella oxytoca NOT DETECTED NOT DETECTED   Klebsiella pneumoniae NOT DETECTED NOT DETECTED   Proteus species NOT DETECTED NOT DETECTED   Serratia marcescens NOT DETECTED NOT DETECTED   Carbapenem resistance NOT DETECTED NOT DETECTED   Haemophilus influenzae NOT DETECTED NOT DETECTED   Neisseria meningitidis NOT DETECTED NOT DETECTED   Pseudomonas aeruginosa NOT DETECTED NOT DETECTED   Candida albicans NOT DETECTED NOT DETECTED   Candida glabrata NOT DETECTED NOT DETECTED   Candida krusei NOT DETECTED NOT DETECTED   Candida parapsilosis NOT DETECTED NOT DETECTED   Candida tropicalis NOT DETECTED NOT DETECTED    Name of physician (or Provider) Contacted: K Schorr text-paged  Changes to prescribed antibiotics required: none required- patient is already on Ceftriaxone  Tawna Alwin D. Negar Sieler, PharmD, BCPS Clinical Pharmacist [phone] 09/01/2017 10:40 PM

## 2017-09-01 NOTE — ED Notes (Signed)
Family at bedside. 

## 2017-09-01 NOTE — H&P (Signed)
History and Physical    [name] QQI:[id number] DOB: 1950/11/29 DOA: 09/01/2017  Referring MD/NP/PA:   PCP: [name], MD (Inactive)   Patient coming from:  The patient is coming from SNF.  At baseline, pt is dependent for most of ADL.   Chief Complaint: Fever, altered mental status  HPI: [name] is a 67 y.o. male with medical history significant of hypertension, hyperlipidemia, diabetes mellitus, atrial fibrillation on Xarelto, OSA not on CPAP, GI bleeding, dCHF, COPD, CKD-3, s/p of right AKA, who presents with fever and altered mental status.  Patient has AMS and is unable to provide any medical history, therefore, most of the history is obtained by discussing the case with ED physician, per EMS report, and with the nursing staff.  Per report, pt is from  [facility]. Patient was found to have fever. Has altered mental status. When I saw pt in Ed, he is not oriented 3. He moves all extremities. He has mild dry cough, but does not seem to have for respiratory distress. No facial droop. No active nausea, vomiting or diarrhea noted. He is s/p of R AKA, ans still has staples in his right stump.  There is very mild redness around staples, but no drainage from the wounds. Patient has large sacral ulcer.  ED Course: pt was found to have WBC 35.5, lactic acid of 5.94, 3.09, positive urinalysis with large amount of leukocytes, potassium 2.2, sodium 149, stable renal function, temperature 103.8, tachycardia, tachypnea, oxygen saturation 92% on room air. Chest x-ray showed vascular congestion without infiltration. Patient is admitted to stepdown as inpatient.  Review of Systems: Could not be reviewed due to altered mental status.  Allergy: No Known Allergies  Past Medical History:  Diagnosis Date  . Arthritis    "hands and lower back" (09/19/2014)  . CAD (coronary artery disease)    a. s/p PCI to RCA in 2012. b. prior cath in 01/2014 with elevated L/RH  pressures, mild-mod CAD of LAD/LCx with patent RCA, normal EF, c/b CIN/CHF.  [name] Carpal tunnel syndrome, bilateral   . Cellulitis   . Chronic diastolic CHF (congestive heart failure) (HCC)   . Chronic kidney disease (CKD), stage III (moderate) (HCC)   . Chronic lower back pain   . Diabetes (HCC)   . DKA (diabetic ketoacidoses) (HCC) 03/2017  . History of blood transfusion ~ 1954   "related to OR"  . Hyperlipidemia   . Hypertension   . Hypoxia    a. Qualified for home O2 at DC in 09/2014.  [name] Lower GI bleed   . Microcytic anemia   . Morbid obesity (HCC)   . Neuropathy   . OSA (obstructive sleep apnea)    "I wear nasal prongs; haven't been using prongs recently" (09/19/2014)  . PAF (paroxysmal atrial fibrillation) (HCC)    TEE DCCV 09/23/2014  . Physical deconditioning   . Pilonidal cyst 1980's; 01/25/2013  . Scrotal abscess   . Type II diabetes mellitus (HCC)     Past Surgical History:  Procedure Laterality Date  . ABDOMINAL SURGERY  ~ 1954   BENIGN TUMOR REMOVED  . AMPUTATION Right 03/23/2017   Procedure: RIGHT BELOW KNEE AMPUTATION;  Surgeon: Nadara Mustard, MD;  Location: [facility] OR;  Service: Orthopedics;  Laterality: Right;  . APPENDECTOMY    . BELOW KNEE LEG AMPUTATION Right 03/23/2017  . CARDIOVERSION  2010   [name] 09/19/2014  . CARDIOVERSION N/A 09/23/2014   Procedure: CARDIOVERSION;  Surgeon: Arlys John  Ludwig Clarks, MD;  Location: [facility] ENDOSCOPY;  Service: Cardiovascular;  Laterality: N/A;  . CARDIOVERSION N/A 12/11/2014   Procedure: CARDIOVERSION;  Surgeon: Thurmon Fair, MD;  Location: [facility] ENDOSCOPY;  Service: Cardiovascular;  Laterality: N/A;  . CATARACT EXTRACTION W/PHACO Right 11/15/2012   Procedure: CATARACT EXTRACTION PHACO AND INTRAOCULAR LENS PLACEMENT (IOC);  Surgeon: Shade Flood, MD;  Location: [facility] OR;  Service: Ophthalmology;  Laterality: Right;  . CATARACT EXTRACTION W/PHACO Left 11/29/2012   Procedure: CATARACT EXTRACTION PHACO AND INTRAOCULAR LENS PLACEMENT (IOC);  Surgeon:  Shade Flood, MD;  Location: [facility] OR;  Service: Ophthalmology;  Laterality: Left;  . CORONARY ANGIOPLASTY WITH STENT PLACEMENT  August 2012   RCA DES - [facility]  . DEBRIDEMENT  FOOT Left    debriding diabetic foot ulcers  . EYE SURGERY    . FOREIGN BODY REMOVAL Right 2014   heel,  splinter removal   . LEFT AND RIGHT HEART CATHETERIZATION WITH CORONARY ANGIOGRAM N/A 01/31/2014   Procedure: LEFT AND RIGHT HEART CATHETERIZATION WITH CORONARY ANGIOGRAM;  Surgeon: Micheline Chapman, MD;  Location: [facility] CATH LAB;  Service: Cardiovascular;  Laterality: N/A;  . PARS PLANA VITRECTOMY Left 06/05/2013   Procedure: PARS PLANA VITRECTOMY WITH 23 GAUGE with Endolaser(constellation);  Surgeon: Shade Flood, MD;  Location: [facility] OR;  Service: Ophthalmology;  Laterality: Left;  . PILONIDAL CYST EXCISION N/A 01/08/2013   Procedure: CYST EXCISION PILONIDAL EXTENSIVE;  Surgeon: Axel Filler, MD;  Location: [facility] OR;  Service: General;  Laterality: N/A;  . PILONIDAL CYST EXCISION  1980's   "in [place]"  . TEE WITHOUT CARDIOVERSION N/A 09/23/2014   Procedure: TRANSESOPHAGEAL ECHOCARDIOGRAM (TEE);  Surgeon: Lewayne Bunting, MD;  Location: [facility] ENDOSCOPY;  Service: Cardiovascular;  Laterality: N/A;  . TONSILLECTOMY      Social History:  reports that he quit smoking about 34 years ago. His smoking use included cigarettes. He has a 20.00 pack-year smoking history. he has never used smokeless tobacco. He reports that he does not drink alcohol or use drugs.  Family History:  Family History  Adopted: Yes  Problem Relation Age of Onset  . Other Other        PT ADOPTED     Prior to Admission medications   Medication Sig Start Date End Date Taking? Authorizing Provider  acetaminophen (TYLENOL) 500 MG tablet Take 500 mg by mouth 3 (three) times daily.     [provider]  Amino Acids-Protein Hydrolys (FEEDING SUPPLEMENT, PRO-STAT SUGAR FREE 64,) LIQD Take 30 mLs by mouth 3 (three) times  daily with meals. 04/21/17   Marguerita Merles Latif, DO  aspirin EC 81 MG EC tablet Take 1 tablet (81 mg total) by mouth daily. 06/17/16   Richarda Overlie, MD  atorvastatin (LIPITOR) 80 MG tablet Take 1 tablet (80 mg total) by mouth daily. Patient taking differently: Take 80 mg by mouth at bedtime.  05/25/16   [name], MD  collagenase (SANTYL) ointment Apply 1 application topically daily.    [provider]  diltiazem (CARDIZEM CD) 240 MG 24 hr capsule Take 1 capsule (240 mg total) by mouth daily. 03/18/17   Calvert Cantor, MD  docusate sodium (COLACE) 100 MG capsule Take 1 capsule (100 mg total) by mouth daily. 01/26/17   [name], MD  feeding supplement, GLUCERNA SHAKE, (GLUCERNA SHAKE) LIQD Take 237 mLs by mouth 3 (three) times daily between meals. 04/21/17   Sheikh, Kateri Mc Latif, DO  gabapentin (NEURONTIN) 300 MG capsule Take 2 capsules (600 mg  total) by mouth 2 (two) times daily. 05/25/16   Langeland, Kathaleen Grinder, MD  hydrocerin (EUCERIN) CREA Apply 1 application topically 3 (three) times daily. legs 05/25/16   Langeland, Alvis Lemmings T, MD  insulin aspart (NOVOLOG) 100 UNIT/ML injection Inject 100 Units into the skin 3 (three) times daily after meals.       insulin glargine (LANTUS) 100 UNIT/ML injection Inject 0.9 mLs (90 Units total) into the skin daily. 04/22/17   Sheikh, Omair Latif, DO  insulin glargine (LANTUS) 100 UNIT/ML injection Inject 0.1 mLs (10 Units total) into the skin at bedtime. 04/21/17   Sheikh, Omair Latif, DO  LANTUS 100 UNIT/ML injection INJECT 50 UNITS INTO THE SKIN 2 TIMES DAILY. 05/04/17   Reather Littler, MD  lidocaine (LMX) 4 % cream Apply topically daily as needed (during hydro). 04/21/17   Marguerita Merles Latif, DO  metoprolol tartrate (LOPRESSOR) 50 MG tablet Take 1 tablet (50 mg total) by mouth 2 (two) times daily. 01/27/17   Quentin Angst, MD  Multiple Vitamin (MULTIVITAMIN WITH MINERALS) TABS Take 1 tablet by mouth daily.    [provider]  oxyCODONE  (ROXICODONE) 15 MG immediate release tablet Take 1 tablet (15 mg total) by mouth every 4 (four) hours as needed for pain. Hold for Sedation 03/25/17   Nadara Mustard, MD  OXYGEN Inhale 2-3 L into the lungs continuous.    [provider]  polyethylene glycol (MIRALAX / GLYCOLAX) packet Take 17 g by mouth 2 (two) times daily. 04/21/17   Marguerita Merles Latif, DO  potassium chloride SA (K-DUR,KLOR-CON) 20 MEQ tablet Take 40 mEq by mouth 2 (two) times daily.    [provider]  senna-docusate (SENOKOT-S) 8.6-50 MG tablet Take 1 tablet by mouth 2 (two) times daily. 04/21/17   Marguerita Merles Latif, DO  silver nitrate applicators 75-25 % applicator Apply 10 Sticks topically as needed for bleeding. 04/21/17   Sheikh, Omair Latif, DO  XARELTO 20 MG TABS tablet TAKE 1 TABLET BY MOUTH EVERY DAY WITH SUPPER 04/05/17   Quentin Angst, MD    Physical Exam: Vitals:   09/01/17 0430 09/01/17 0500 09/01/17 0526 09/01/17 0530  BP: (!) 107/57 103/78  100/67  Pulse: (!) 120 (!) 124  (!) 104  Resp: 17 16  (!) 24  Temp:   (!) 102.2 F (39 C)   TempSrc:   Rectal   SpO2: 92% 99%  100%  Weight:       General: Not in acute distress HEENT:       Eyes: PERRL, EOMI, no scleral icterus.       ENT: No discharge from the ears and nose, no pharynx injection, no tonsillar enlargement.        Neck: No JVD, no bruit, no mass felt. Heme: No neck lymph node enlargement. Cardiac: S1/S2, RRR, No murmurs, No gallops or rubs. Respiratory: No rales, wheezing, rhonchi or rubs. GI: Soft, nondistended, nontender, no organomegaly, BS present. GU: No hematuria Ext: No pitting leg edema bilaterally. 2+DP/PT pulse on the left leg. S/p of R AKA, has very mild redness around staples, but no drainage from the wounds. Musculoskeletal: No joint deformities, No joint redness or warmth, no limitation of ROM in spin. Skin: has rashes in left arm. Has sacral ulcer wound. Neuro: Altered, not oriented X3, not following  commands, cranial nerves II-XII grossly intact, moves all extremities. Psych: Patient is not psychotic.  Labs on Admission: I have personally reviewed following labs and imaging studies  CBC: Recent  Labs  Lab 09/01/17 0237  WBC 35.5*  NEUTROABS 31.6*  HGB 9.7*  HCT 32.9*  MCV 73.4*  PLT 505*   Basic Metabolic Panel: Recent Labs  Lab 09/01/17 0237  NA 149*  K 2.2*  CL 107  CO2 23  GLUCOSE 176*  BUN 10  CREATININE 1.87*  CALCIUM 7.8*   GFR: Estimated Creatinine Clearance: 56.5 mL/min (A) (by C-G formula based on SCr of 1.87 mg/dL (H)). Liver Function Tests: Recent Labs  Lab 09/01/17 0237  AST 38  ALT 12*  ALKPHOS 132*  BILITOT 1.6*  PROT 7.7  ALBUMIN 2.0*   No results for input(s): LIPASE, AMYLASE in the last 168 hours. No results for input(s): AMMONIA in the last 168 hours. Coagulation Profile: Recent Labs  Lab 09/01/17 0237  INR 1.49   Cardiac Enzymes: No results for input(s): CKTOTAL, CKMB, CKMBINDEX, TROPONINI in the last 168 hours. BNP (last 3 results) No results for input(s): PROBNP in the last 8760 hours. HbA1C: No results for input(s): HGBA1C in the last 72 hours. CBG: No results for input(s): GLUCAP in the last 168 hours. Lipid Profile: No results for input(s): CHOL, HDL, LDLCALC, TRIG, CHOLHDL, LDLDIRECT in the last 72 hours. Thyroid Function Tests: No results for input(s): TSH, T4TOTAL, FREET4, T3FREE, THYROIDAB in the last 72 hours. Anemia Panel: No results for input(s): VITAMINB12, FOLATE, FERRITIN, TIBC, IRON, RETICCTPCT in the last 72 hours. Urine analysis:    Component Value Date/Time   COLORURINE AMBER (A) 09/01/2017 0237   APPEARANCEUR CLOUDY (A) 09/01/2017 0237   LABSPEC 1.006 09/01/2017 0237   PHURINE 7.0 09/01/2017 0237   GLUCOSEU NEGATIVE 09/01/2017 0237   HGBUR MODERATE (A) 09/01/2017 0237   BILIRUBINUR NEGATIVE 09/01/2017 0237   KETONESUR NEGATIVE 09/01/2017 0237   PROTEINUR 100 (A) 09/01/2017 0237   UROBILINOGEN 1.0  11/27/2014 0755   NITRITE POSITIVE (A) 09/01/2017 0237   LEUKOCYTESUR LARGE (A) 09/01/2017 0237   Sepsis Labs: @LABRCNTIP (procalcitonin:4,lacticidven:4) )No results found for this or any previous visit (from the past 240 hour(s)).   Radiological Exams on Admission: Dg Chest Portable 1 View  Result Date: 09/01/2017 CLINICAL DATA:  Code sepsis. EXAM: PORTABLE CHEST 1 VIEW COMPARISON:  CT chest radiograph September 01, 2017 at 0231 hours FINDINGS: Cardiac silhouette is mildly enlarged even with consideration to this low inspiratory portable examination with crowded vascular markings. Calcified aortic knob. Pulmonary vascular congestion without pleural effusion or focal consolidation. No pneumothorax. Bandlike density LEFT lung base. Soft tissue planes and included osseous structures are unchanged. IMPRESSION: Cardiomegaly and pulmonary vascular congestion with LEFT lung base atelectasis/scarring. Aortic Atherosclerosis (ICD10-I70.0). Electronically Signed   By: Awilda Metro M.D.   On: 09/01/2017 04:46   Dg Chest Portable 1 View  Result Date: 09/01/2017 CLINICAL DATA:  Cough and fever EXAM: PORTABLE CHEST 1 VIEW COMPARISON:  03/14/2017 FINDINGS: Cardiomegaly with mild central congestion. Low lung volumes. Linear atelectasis in the left lower lung. No pleural effusion or focal consolidation. Aortic atherosclerosis. No pneumothorax. IMPRESSION: 1. Cardiomegaly with vascular congestion and atelectasis in the left lower lung 2. No focal pulmonary infiltrate is seen Electronically Signed   By: Jasmine Pang M.D.   On: 09/01/2017 03:08     EKG: Independently reviewed.  Atrial fibrillation with RVR, QTC 518, LAD, low voltage,   Assessment/Plan Principal Problem:   UTI (urinary tract infection) Active Problems:   Hyperlipidemia   HYPOKALEMIA   Obesity, Class III, BMI 40-49.9 (morbid obesity) (HCC)   ANEMIA   CAD S/P percutaneous coronary angioplasty  Long term (current) use of  anticoagulants   Chronic diastolic congestive heart failure (HCC)   CKD (chronic kidney disease) stage 3, GFR 30-59 ml/min (HCC)   Atrial fibrillation with RVR (HCC)   Sepsis, unspecified organism (HCC)   Pressure ulcer   Acute metabolic encephalopathy   S/P AKA (above knee amputation) unilateral, right (HCC)   Sepsis possibly due to UTI: Patient meets criteria for sepsis with leukocytosis, fever, tachypnea, tachycardia. Lactic acid is elevated 5.94, 3.09. Currently hemodynamically stable.   - Admit to SUD as inpt -  Ceftriaxone by IV (patient also received one dose of vancomycin and Zosyn in ED). - Follow up results of urine and blood cx and amend antibiotic regimen if needed per sensitivity results - prn Zofran for nausea - will get Procalcitonin and trend lactic acid levels per sepsis protocol. - IVF: 4L of NS bolus in ED - f/u Bx and Ux  Acute metabolic encephalopathy: most likely due to UTI and sepsis. -on Abx for UTI -will get CT-head -Frequent neuro checks -hold home oral medications unitl patient's mental status improves.  Hyperlipidemia: --hold lipitor until patient's mental status improves.  Hypokalemia: K=      on admission. - Replete with IV KCl, 10 mEQ x 6 - give 1 g of magnesium sulfate. - Check Mg level  ANEMIA: Hgb 9.7 -f/u by CBC  CAD S/P percutaneous coronary angioplasty: -switch ASA per rectal  Atrial fibrillation with RVR: CHA2DS2-VASc Score is 5, needs oral anticoagulation. Patient is on Xarelto at home. -IV metoprolol -Hold oral Xarelto unitl patient's mental status improves.  Chronic diastolic congestive heart failure (HCC): 2-D echo on 01/22/17 showed EF 60-65%. Patient does not have leg edema. CHF seems to be compensated. -Check  BNP  CKD (chronic kidney disease) stage 3, GFR 30-59 ml/min (HCC): stable. Baseline creatinine 1.4-1.8. His creatinine is 1.87, BUN 10.  Pessure ulcer: has a big sacral wound.  -wound care consult  S/P AKA (above  knee amputation) unilateral, right (HCC): still has staples. Does not seem to be infected. -observe closely  DVT ppx: SQ Lovenox Code Status: Full code Family Communication: None at bed side.    Disposition Plan:  Anticipate discharge back to previous SNF environment Consults called:  none Admission status:  SDU/inpation       Date of Service 09/01/2017    Lorretta Harp [name] Pager [phone]  If 7PM-7AM, please contact night-coverage [email or web address] Password [facility] 09/01/2017, 6:21 AM

## 2017-09-02 DIAGNOSIS — L89304 Pressure ulcer of unspecified buttock, stage 4: Secondary | ICD-10-CM

## 2017-09-02 DIAGNOSIS — E876 Hypokalemia: Secondary | ICD-10-CM

## 2017-09-02 LAB — BASIC METABOLIC PANEL
ANION GAP: 12 (ref 5–15)
Anion gap: 16 — ABNORMAL HIGH (ref 5–15)
BUN: 16 mg/dL (ref 6–20)
BUN: 17 mg/dL (ref 6–20)
CALCIUM: 7 mg/dL — AB (ref 8.9–10.3)
CO2: 19 mmol/L — ABNORMAL LOW (ref 22–32)
CO2: 22 mmol/L (ref 22–32)
CREATININE: 1.91 mg/dL — AB (ref 0.61–1.24)
Calcium: 7.2 mg/dL — ABNORMAL LOW (ref 8.9–10.3)
Chloride: 119 mmol/L — ABNORMAL HIGH (ref 101–111)
Chloride: 120 mmol/L — ABNORMAL HIGH (ref 101–111)
Creatinine, Ser: 1.81 mg/dL — ABNORMAL HIGH (ref 0.61–1.24)
GFR, EST AFRICAN AMERICAN: 41 mL/min — AB (ref >60)
GFR, EST AFRICAN AMERICAN: 44 mL/min — AB (ref >60)
GFR, EST NON AFRICAN AMERICAN: 35 mL/min — AB (ref >60)
GFR, EST NON AFRICAN AMERICAN: 38 mL/min — AB (ref >60)
Glucose, Bld: 212 mg/dL — ABNORMAL HIGH (ref 65–99)
Glucose, Bld: 170 mg/dL — ABNORMAL HIGH (ref 65–99)
Potassium: 3 mmol/L — ABNORMAL LOW (ref 3.5–5.1)
Potassium: 2.1 mmol/L — CL (ref 3.5–5.1)
SODIUM: 154 mmol/L — AB (ref 135–145)
Sodium: 154 mmol/L — ABNORMAL HIGH (ref 135–145)

## 2017-09-02 LAB — CBC
HCT: 29 % — ABNORMAL LOW (ref 39.0–52.0)
HCT: 28 % — ABNORMAL LOW (ref 39.0–52.0)
HEMOGLOBIN: 8.1 g/dL — AB (ref 13.0–17.0)
Hemoglobin: 8.3 g/dL — ABNORMAL LOW (ref 13.0–17.0)
MCH: 20.7 pg — ABNORMAL LOW (ref 26.0–34.0)
MCH: 21.1 pg — AB (ref 26.0–34.0)
MCHC: 28.6 g/dL — ABNORMAL LOW (ref 30.0–36.0)
MCHC: 28.9 g/dL — ABNORMAL LOW (ref 30.0–36.0)
MCV: 72.3 fL — ABNORMAL LOW (ref 78.0–100.0)
MCV: 73.1 fL — ABNORMAL LOW (ref 78.0–100.0)
PLATELETS: 352 10*3/uL (ref 150–400)
PLATELETS: 341 10*3/uL (ref 150–400)
RBC: 4.01 MIL/uL — ABNORMAL LOW (ref 4.22–5.81)
RBC: 3.83 MIL/uL — AB (ref 4.22–5.81)
RDW: 25 % — AB (ref 11.5–15.5)
RDW: 25 % — ABNORMAL HIGH (ref 11.5–15.5)
WBC: 15.5 10*3/uL — ABNORMAL HIGH (ref 4.0–10.5)
WBC: 12 10*3/uL — AB (ref 4.0–10.5)

## 2017-09-02 LAB — GLUCOSE, CAPILLARY
GLUCOSE-CAPILLARY: 187 mg/dL — AB (ref 65–99)
GLUCOSE-CAPILLARY: 162 mg/dL — AB (ref 65–99)
Glucose-Capillary: 141 mg/dL — ABNORMAL HIGH (ref 65–99)
Glucose-Capillary: 159 mg/dL — ABNORMAL HIGH (ref 65–99)

## 2017-09-02 LAB — LACTIC ACID, PLASMA: LACTIC ACID, VENOUS: 1.8 mmol/L (ref 0.5–1.9)

## 2017-09-02 LAB — MRSA PCR SCREENING: MRSA BY PCR: POSITIVE — AB

## 2017-09-02 LAB — MAGNESIUM: Magnesium: 1.3 mg/dL — ABNORMAL LOW (ref 1.7–2.4)

## 2017-09-02 MED ORDER — HALOPERIDOL LACTATE 5 MG/ML IJ SOLN
2.0000 mg | Freq: Four times a day (QID) | INTRAMUSCULAR | Status: DC | PRN
  Administered 2017-09-02 – 2017-09-10 (×11): 2 mg via INTRAVENOUS
  Filled 2017-09-02 (×11): qty 1

## 2017-09-02 MED ORDER — SODIUM CHLORIDE 0.9 % IV SOLN
INTRAVENOUS | Status: DC
  Administered 2017-09-02: 10:00:00 via INTRAVENOUS

## 2017-09-02 MED ORDER — LORAZEPAM 2 MG/ML IJ SOLN
INTRAMUSCULAR | Status: AC
  Administered 2017-09-02: 1 mg via INTRAVENOUS
  Filled 2017-09-02: qty 1

## 2017-09-02 MED ORDER — DILTIAZEM HCL-DEXTROSE 100-5 MG/100ML-% IV SOLN (PREMIX)
5.0000 mg/h | INTRAVENOUS | Status: DC
  Administered 2017-09-02: 12.5 mg/h via INTRAVENOUS
  Administered 2017-09-02: 7.5 mg/h via INTRAVENOUS
  Administered 2017-09-02: 5 mg/h via INTRAVENOUS
  Administered 2017-09-03 – 2017-09-08 (×8): 10 mg/h via INTRAVENOUS
  Filled 2017-09-02 (×17): qty 100

## 2017-09-02 MED ORDER — MORPHINE SULFATE (PF) 2 MG/ML IV SOLN
2.0000 mg | INTRAVENOUS | Status: DC | PRN
  Administered 2017-09-02 – 2017-09-04 (×9): 2 mg via INTRAVENOUS
  Filled 2017-09-02 (×10): qty 1

## 2017-09-02 MED ORDER — SODIUM CHLORIDE 0.45 % IV SOLN
INTRAVENOUS | Status: DC
  Administered 2017-09-02: 16:00:00 via INTRAVENOUS

## 2017-09-02 MED ORDER — MAGNESIUM SULFATE 2 GM/50ML IV SOLN
2.0000 g | Freq: Once | INTRAVENOUS | Status: AC
  Administered 2017-09-02: 2 g via INTRAVENOUS
  Filled 2017-09-02: qty 50

## 2017-09-02 MED ORDER — DILTIAZEM LOAD VIA INFUSION
15.0000 mg | Freq: Once | INTRAVENOUS | Status: AC
  Administered 2017-09-02: 15 mg via INTRAVENOUS
  Filled 2017-09-02: qty 15

## 2017-09-02 MED ORDER — POTASSIUM CHLORIDE IN NACL 40-0.9 MEQ/L-% IV SOLN
INTRAVENOUS | Status: DC
  Filled 2017-09-02: qty 1000

## 2017-09-02 MED ORDER — SODIUM CHLORIDE 0.9 % IV SOLN
2.0000 g | INTRAVENOUS | Status: DC
  Administered 2017-09-02 – 2017-09-03 (×2): 2 g via INTRAVENOUS
  Filled 2017-09-02 (×2): qty 20

## 2017-09-02 MED ORDER — POTASSIUM CHLORIDE 20 MEQ/15ML (10%) PO SOLN
40.0000 meq | Freq: Two times a day (BID) | ORAL | Status: AC
  Administered 2017-09-02 (×2): 40 meq via ORAL
  Filled 2017-09-02 (×2): qty 30

## 2017-09-02 MED ORDER — CHLORHEXIDINE GLUCONATE CLOTH 2 % EX PADS
6.0000 | MEDICATED_PAD | Freq: Every day | CUTANEOUS | Status: AC
  Administered 2017-09-04 – 2017-09-07 (×4): 6 via TOPICAL

## 2017-09-02 MED ORDER — LORAZEPAM 2 MG/ML IJ SOLN
1.0000 mg | Freq: Once | INTRAMUSCULAR | Status: AC
  Administered 2017-09-02: 1 mg via INTRAVENOUS

## 2017-09-02 MED ORDER — MORPHINE SULFATE (PF) 4 MG/ML IV SOLN
4.0000 mg | INTRAVENOUS | Status: AC
  Administered 2017-09-02: 4 mg via INTRAVENOUS
  Filled 2017-09-02: qty 1

## 2017-09-02 MED ORDER — MORPHINE SULFATE (PF) 4 MG/ML IV SOLN: 4.0000 mg | Freq: Once | INTRAVENOUS | Status: DC

## 2017-09-02 MED ORDER — MORPHINE SULFATE (PF) 2 MG/ML IV SOLN
2.0000 mg | INTRAVENOUS | Status: AC | PRN
  Administered 2017-09-02 (×2): 2 mg via INTRAVENOUS
  Filled 2017-09-02 (×2): qty 1

## 2017-09-02 MED ORDER — HALOPERIDOL LACTATE 5 MG/ML IJ SOLN
INTRAMUSCULAR | Status: AC
  Administered 2017-09-02: 2 mg via INTRAVENOUS
  Filled 2017-09-02: qty 1

## 2017-09-02 MED ORDER — MUPIROCIN 2 % EX OINT
1.0000 "application " | TOPICAL_OINTMENT | Freq: Two times a day (BID) | CUTANEOUS | Status: AC
  Administered 2017-09-03 – 2017-09-07 (×9): 1 via NASAL
  Filled 2017-09-02: qty 22

## 2017-09-02 MED ORDER — LORAZEPAM 2 MG/ML IJ SOLN
1.0000 mg | INTRAMUSCULAR | Status: DC | PRN
  Administered 2017-09-02 – 2017-09-07 (×18): 1 mg via INTRAVENOUS
  Filled 2017-09-02 (×17): qty 1

## 2017-09-02 NOTE — Progress Notes (Addendum)
PROGRESS NOTE  [name] DQQ:[id number] DOB: 26-Feb-1951 DOA: 09/01/2017 PCP: [name], MD (Inactive)  HPI/Recap of past 75 hours: 68 year old male with past medical history of atrial fibrillation and xarelto, diabetes mellitus, diastolic heart failure, obstructive sleep apnea, stage III CAD and status post right AKA admitted on the early morning of 2/21 after coming in from his nursing facility with fever and acute encephalopathy. Patient has had previous hospitalizations for acute urinary retention leading to UTI and sepsis. In the emergency room, patient found to be in sepsis secondary to UTI again. Patient started on aggressive fluid resuscitation as well as IV antibiotics. On admission also noted to be hypernatremic with sodium of 149, BNP of 1400 and creatinine of 1.87 (baseline around 1.5)  By following day, patient's sodium a little bit worse at 154 with creatinine slightly increased to 1.9. White count however had significantly improved from 35 down to 12.  Patient remains acutely confused and agitated. Also in rapid ventricular rate from atrial fibrillation. Although he was making urine, nursing noted that he had significant urinary retention with 900 cc of urine in his bladder. Foley catheter placement. Patient has required frequent doses of IV Ativan and Haldol to prevent him from dislodging his IV lines. Patient is able to answer some questions such as his name and he knows that he is in the hospital, however other times when asked if he is having pain where he can localize this, he does not really say this and moans.  call Assessment/Plan: Principal Problem:   Sepsis secondary to UTI (urinary tract infection): Patient met criteria for sepsis on admission given leukocytosis, tachycardia, acute kidney injury and urinary source of leukocytosis.  His lactic acid level which was 3.2 on admission and has trended down to 1.8 now and given overall improvement with blood cell count,  it is safer to say to sepsis itself has resolved. We'll plan to leave in Foley catheter for now. Patient responded to IV Rocephin. Urine culture pending. Active Problems:  hypernatremia: Secondary to severe intravascular volume depletion and dehydration. Following initial fluid in the emergency room, fluids were not continued. Have resumed them now and numbers should improve accordingly.    Hypomagnesemia/HYPOKALEMIA: Will change IV fluids to include potassium    Obesity, Class III, BMI 40-49.9 (morbid obesity) ([place]): Patient meets criteria with BMI greater than 40    ANEMIA: Secondary to chronic renal disease. Hemoglobin on admission 9.7 which was quite high from patient's baseline more consistent with dehydration and hemoconcentration. With IV fluids, has come down to 8 which is much closer to his baseline.    CAD S/P percutaneous coronary angioplasty: Stable    Long term (current) use of anticoagulants: Will restart xarelto, renally adjusting.    Chronic diastolic congestive heart failure ([place]): Elevated BNP, but patient actually looks more intravascularly volume depleted. Would favor for now aggressively rehydrating and then once sodium, creatinine back to baseline can restart diuretics accordingly.   Acute kidney injury in the setting of CKD (chronic kidney disease) stage 3, GFR 30-59 ml/min ([place]): Hopefully improving with more aggressive fluid resuscitation.   Atrial fibrillation with RVR ([place]): Secondary to severe volume depletion, patient with rapid ventricular rate. Started on IV Cardizem following Cardizem bolus. Really helped his heart rate. Will wait until agitation and delirium have resolved for trying to wean off of Cardizem drip. Chads 2 score of 5.    Pressure ulcer, decubitus: Wound care consult   Acute metabolic encephalopathy: Secondary to sepsis  S/P AKA (above knee amputation) unilateral, right (HCC)   Code Status: Full code   Family Communication: Left message with  sister   Disposition Plan: Likely will be here for several days in the ICU until blood pressure stabilized, heart rate controlled and infection treated plus encephalopathy resolved    Consultants:  None   Procedures:  None   Antimicrobials:  IV Rocephin 2 g every 24 hours 2/21-present   DVT prophylaxis:  Xarelto   Objective: Vitals:   09/02/17 1123 09/02/17 1300 09/02/17 1323 09/02/17 1400  BP: 128/71 (!) 89/60 (!) 112/55 121/84  Pulse:  95 (!) 110 87  Resp:  (!) 24 (!) 25 20  Temp:   (!) 101.3 F (38.5 C)   TempSrc:   Axillary   SpO2:  99% 100% 100%  Weight:      Height:        Intake/Output Summary (Last 24 hours) at 09/02/2017 1527 Last data filed at 09/02/2017 1500 Gross per 24 hour  Intake 100 ml  Output 1325 ml  Net -1225 ml   Filed Weights   09/01/17 2027 09/02/17 0505 09/02/17 0728  Weight: 127 kg (279 lb 16 oz) 114.8 kg (253 lb) 121 kg (266 lb 12.8 oz)   Body mass index is 33.35 kg/m.  Exam:   General:  At times delirious, when answers questions, oriented 2    HEENT: Normocephalic, atraumatic, mucous membranes are dry   Neck: Supple, no JVD   Cardiovascular: Irregular rhythm, borderline tachycardic    Respiratory: Moderate inspiratory effort, clear to auscultation bilaterally, breathing not really    Abdomen: Soft, nontender, nondistended, hypoactive bowel sounds    Musculoskeletal: Status post right AKA. 1+ pitting edema from the left knee down   Skin: Chronic venous stasis of the left leg. Stage IV decubitus ulcer, present on admission on back side.  Neuro: Chronic sensory neuropathy, otherwise no focal deficits  Psychiatry: acutely delirious or    Data Reviewed: CBC: Recent Labs  Lab 09/01/17 0237 09/02/17 0752 09/02/17 1452  WBC 35.5* 15.5* 12.0*  NEUTROABS 31.6*  --   --   HGB 9.7* 8.3* 8.1*  HCT 32.9* 29.0* 28.0*  MCV 73.4* 72.3* 73.1*  PLT 505* 352 945   Basic Metabolic Panel: Recent Labs  Lab 09/01/17 0237  09/02/17 0752  NA 149* 154*  K 2.2* 3.0*  CL 107 119*  CO2 23 19*  GLUCOSE 176* 212*  BUN 10 16  CREATININE 1.87* 1.91*  CALCIUM 7.8* 7.2*  MG  --  1.3*   GFR: Estimated Creatinine Clearance: 54 mL/min (A) (by C-G formula based on SCr of 1.91 mg/dL (H)). Liver Function Tests: Recent Labs  Lab 09/01/17 0237  AST 38  ALT 12*  ALKPHOS 132*  BILITOT 1.6*  PROT 7.7  ALBUMIN 2.0*   No results for input(s): LIPASE, AMYLASE in the last 168 hours. No results for input(s): AMMONIA in the last 168 hours. Coagulation Profile: Recent Labs  Lab 09/01/17 0237  INR 1.49   Cardiac Enzymes: No results for input(s): CKTOTAL, CKMB, CKMBINDEX, TROPONINI in the last 168 hours. BNP (last 3 results) No results for input(s): PROBNP in the last 8760 hours. HbA1C: No results for input(s): HGBA1C in the last 72 hours. CBG: Recent Labs  Lab 09/01/17 1158 09/01/17 1654 09/01/17 2051 09/02/17 0827 09/02/17 1305  GLUCAP 131* 167* 189* 187* 141*   Lipid Profile: No results for input(s): CHOL, HDL, LDLCALC, TRIG, CHOLHDL, LDLDIRECT in the last 72 hours. Thyroid Function Tests:  No results for input(s): TSH, T4TOTAL, FREET4, T3FREE, THYROIDAB in the last 72 hours. Anemia Panel: No results for input(s): VITAMINB12, FOLATE, FERRITIN, TIBC, IRON, RETICCTPCT in the last 72 hours. Urine analysis:    Component Value Date/Time   COLORURINE AMBER (A) 09/01/2017 0237   APPEARANCEUR CLOUDY (A) 09/01/2017 0237   LABSPEC 1.006 09/01/2017 0237   PHURINE 7.0 09/01/2017 0237   GLUCOSEU NEGATIVE 09/01/2017 0237   HGBUR MODERATE (A) 09/01/2017 0237   BILIRUBINUR NEGATIVE 09/01/2017 0237   KETONESUR NEGATIVE 09/01/2017 0237   PROTEINUR 100 (A) 09/01/2017 0237   UROBILINOGEN 1.0 11/27/2014 0755   NITRITE POSITIVE (A) 09/01/2017 0237   LEUKOCYTESUR LARGE (A) 09/01/2017 0237   Sepsis Labs: _0 (procalcitonin:4,lacticidven:4)  ) Recent Results (from the past 240 hour(s))  Culture, blood  (Routine x 2)     Status: None (Preliminary result)   Collection Time: 09/01/17  2:37 AM  Result Value Ref Range Status   Specimen Description BLOOD SITE NOT SPECIFIED  Final   Special Requests BOTTLES DRAWN AEROBIC AND ANAEROBIC  Final   Culture  Setup Time   Final    GRAM NEGATIVE RODS IN BOTH AEROBIC AND ANAEROBIC BOTTLES CRITICAL RESULT CALLED TO, READ BACK BY AND VERIFIED WITH: L BAJBUS PHARMD 2230 09/01/17 A BROWNING Performed at [facility], [place] [address], [place], [place] [address]    Culture GRAM NEGATIVE RODS  Final   Report Status PENDING  Incomplete  Urine culture     Status: Abnormal (Preliminary result)   Collection Time: 09/01/17  2:37 AM  Result Value Ref Range Status   Specimen Description URINE, CATHETERIZED  Final   Special Requests   Final    NONE Performed at [facility], [place] [address], [place], [place] [address]    Culture >=100,000 COLONIES/mL ESCHERICHIA COLI (A)  Final   Report Status PENDING  Incomplete  Culture, blood (Routine x 2)     Status: Abnormal (Preliminary result)   Collection Time: 09/01/17  2:42 AM  Result Value Ref Range Status   Specimen Description BLOOD SITE NOT SPECIFIED  Final   Special Requests   Final    Blood Culture adequate volume BOTTLES DRAWN AEROBIC AND ANAEROBIC   Culture  Setup Time   Final    GRAM NEGATIVE RODS IN BOTH AEROBIC AND ANAEROBIC BOTTLES CRITICAL RESULT CALLED TO, READ BACK BY AND VERIFIED WITH: L BAJBUS PHARMD 2230 09/01/17 A BROWNING    Culture (A)  Final    ESCHERICHIA COLI SUSCEPTIBILITIES TO FOLLOW Performed at [facility], [place] [address] [place], [place] [address]    Report Status PENDING  Incomplete  Blood Culture ID Panel (Reflexed)     Status: Abnormal   Collection Time: 09/01/17  2:42 AM  Result Value Ref Range Status   Enterococcus species NOT DETECTED NOT DETECTED Final   Listeria monocytogenes NOT DETECTED NOT DETECTED Final   Staphylococcus species NOT DETECTED NOT  DETECTED Final   Staphylococcus aureus NOT DETECTED NOT DETECTED Final   Streptococcus species NOT DETECTED NOT DETECTED Final   Streptococcus agalactiae NOT DETECTED NOT DETECTED Final   Streptococcus pneumoniae NOT DETECTED NOT DETECTED Final   Streptococcus pyogenes NOT DETECTED NOT DETECTED Final   Acinetobacter baumannii NOT DETECTED NOT DETECTED Final   Enterobacteriaceae species DETECTED (A) NOT DETECTED Final    Comment: Enterobacteriaceae represent a large family of gram-negative bacteria, not a single organism. CRITICAL RESULT CALLED TO, READ BACK BY AND VERIFIED WITH: L BAJBUS PHARMD  2230 09/01/17 A BROWNING    Enterobacter cloacae complex NOT DETECTED NOT DETECTED Final   Escherichia coli DETECTED (A) NOT DETECTED Final    Comment: CRITICAL RESULT CALLED TO, READ BACK BY AND VERIFIED WITH: L BAJBUS PHARMD 2230 09/01/17 A BROWNING    Klebsiella oxytoca NOT DETECTED NOT DETECTED Final   Klebsiella pneumoniae NOT DETECTED NOT DETECTED Final   Proteus species NOT DETECTED NOT DETECTED Final   Serratia marcescens NOT DETECTED NOT DETECTED Final   Carbapenem resistance NOT DETECTED NOT DETECTED Final   Haemophilus influenzae NOT DETECTED NOT DETECTED Final   Neisseria meningitidis NOT DETECTED NOT DETECTED Final   Pseudomonas aeruginosa NOT DETECTED NOT DETECTED Final   Candida albicans NOT DETECTED NOT DETECTED Final   Candida glabrata NOT DETECTED NOT DETECTED Final   Candida krusei NOT DETECTED NOT DETECTED Final   Candida parapsilosis NOT DETECTED NOT DETECTED Final   Candida tropicalis NOT DETECTED NOT DETECTED Final    Comment: Performed at [facility], [place] [address] [place], [place] [address]  MRSA PCR Screening     Status: Abnormal   Collection Time: 09/01/17  9:48 PM  Result Value Ref Range Status   MRSA by PCR POSITIVE (A) NEGATIVE Final    Comment:        The GeneXpert MRSA Assay (FDA approved for NASAL specimens only), is one component of  a comprehensive MRSA colonization surveillance program. It is not intended to diagnose MRSA infection nor to guide or monitor treatment for MRSA infections. RESULT CALLED TO, READ BACK BY AND VERIFIED WITH: J.MILLER,RN AT 0041 09/02/17 L.PITT       Studies: No results found.  Scheduled Meds: . aspirin  300 mg Rectal Daily  . enoxaparin (LOVENOX) injection  40 mg Subcutaneous Daily  . insulin aspart  0-9 Units Subcutaneous TID WC  . insulin detemir  5 Units Subcutaneous BID  . metoprolol tartrate  5 mg Intravenous Q8H    Continuous Infusions: . sodium chloride 125 mL/hr at 09/02/17 1015  . cefTRIAXone (ROCEPHIN)  IV Stopped (09/02/17 1213)  . diltiazem (CARDIZEM) infusion 12.5 mg/hr (09/02/17 1525)     LOS: 1 day     Annita Brod, MD [name]  To reach me or the doctor on call, go to: [email or web address] Password [facility]  09/02/2017, 3:27 PM

## 2017-09-02 NOTE — Clinical Social Work Note (Signed)
Clinical Social Work Assessment  Patient Details  Name: [name] MRN: [id number] Date of Birth: 1951-01-05  Date of referral:  09/02/17               Reason for consult:  Discharge Planning                Permission sought to share information with:  Facility Medical sales representative, Family Supports Permission granted to share information::  Yes, Verbal Permission Granted  Name::     [name]  Agency::  [facility]  Relationship::  Sister  Contact Information:  [phone]  Housing/Transportation Living arrangements for the past 2 months:  Skilled Nursing Facility Source of Information:  Medical Team, Siblings Patient Interpreter Needed:  None Criminal Activity/Legal Involvement Pertinent to Current Situation/Hospitalization:  No - Comment as needed Significant Relationships:  Siblings Lives with:  Facility Resident Do you feel safe going back to the place where you live?  Yes Need for family participation in patient care:  Yes (Comment)  Care giving concerns:  Patient is a long-term care resident at [facility].   Social Worker assessment / plan:  CSW received call back from patient's sister. CSW introduced role and explained that discharge planning would be discussed. Patient's sister confirmed is a long-term care resident at [facility] and the plan is for him to return once stable for discharge. No further concerns. CSW encouraged patient's sister to contact CSW as needed. CSW will continue to follow patient and his sister for support and facilitate discharge back to SNF once medically stable.  Employment status:  Retired Database administrator PT Recommendations:  Not assessed at this time Information / Referral to community resources:  Skilled Nursing Facility  Patient/Family's Response to care:  Patient not oriented. Patient's sister agreeable to return to SNF. Patient's sister supportive and involved in  patient's care. Patient's sister appreciated social work intervention.  Patient/Family's Understanding of and Emotional Response to Diagnosis, Current Treatment, and Prognosis:  Patient not oriented. Patient's sister has a good understanding of the reason for admission and need to return to SNF once medically stable for discharge. Patient's sister appears happy with hospital care.  Emotional Assessment Appearance:  Appears stated age Attitude/Demeanor/Rapport:  Unable to Assess Affect (typically observed):  Unable to Assess Orientation:    Alcohol / Substance use:  Never Used Psych involvement (Current and /or in the community):  No (Comment)  Discharge Needs  Concerns to be addressed:  Care Coordination Readmission within the last 30 days:  No Current discharge risk:  Cognitively Impaired Barriers to Discharge:  Continued Medical Work up   Margarito Liner, LCSW 09/02/2017, 3:53 PM

## 2017-09-02 NOTE — Clinical Social Work Note (Signed)
Patient is a long-term resident at [facility]. CSW left voicemail for patient's sister. Will complete assessment when she returns call.  Charlynn Court, CSW [phone]

## 2017-09-02 NOTE — Progress Notes (Signed)
CRITICAL VALUE ALERT  Critical Value: Potassium 2.1  Date & Time Notied: 2/22 1546  Provider Notified: Yes Rito Ehrlich)  Orders Received/Actions taken: Yes

## 2017-09-02 NOTE — Progress Notes (Signed)
Patient increasingly agitated despite being given Ativan and Haldol. Bladder scan reveals >900cc, MD paged for in-and-out order, decided Foley is best option. Also prescribed morphine for pain. Will continue to monitor.

## 2017-09-03 ENCOUNTER — Encounter (HOSPITAL_COMMUNITY): Payer: Self-pay

## 2017-09-03 ENCOUNTER — Other Ambulatory Visit: Payer: Self-pay

## 2017-09-03 DIAGNOSIS — R7881 Bacteremia: Secondary | ICD-10-CM

## 2017-09-03 DIAGNOSIS — N289 Disorder of kidney and ureter, unspecified: Secondary | ICD-10-CM

## 2017-09-03 DIAGNOSIS — A4151 Sepsis due to Escherichia coli [E. coli]: Secondary | ICD-10-CM

## 2017-09-03 LAB — IRON AND TIBC
Iron: 33 ug/dL — ABNORMAL LOW (ref 45–182)
SATURATION RATIOS: 29 % (ref 17.9–39.5)
TIBC: 113 ug/dL — AB (ref 250–450)
UIBC: 80 ug/dL

## 2017-09-03 LAB — GLUCOSE, CAPILLARY
GLUCOSE-CAPILLARY: 172 mg/dL — AB (ref 65–99)
Glucose-Capillary: 162 mg/dL — ABNORMAL HIGH (ref 65–99)
Glucose-Capillary: 169 mg/dL — ABNORMAL HIGH (ref 65–99)
Glucose-Capillary: 191 mg/dL — ABNORMAL HIGH (ref 65–99)

## 2017-09-03 LAB — BASIC METABOLIC PANEL
Anion gap: 13 (ref 5–15)
Anion gap: 19 — ABNORMAL HIGH (ref 5–15)
BUN: 17 mg/dL (ref 6–20)
BUN: 15 mg/dL (ref 6–20)
CALCIUM: 6.6 mg/dL — AB (ref 8.9–10.3)
CALCIUM: 6.1 mg/dL — AB (ref 8.9–10.3)
CHLORIDE: 106 mmol/L (ref 101–111)
CO2: 22 mmol/L (ref 22–32)
CO2: 21 mmol/L — AB (ref 22–32)
CREATININE: 1.65 mg/dL — AB (ref 0.61–1.24)
Chloride: 115 mmol/L — ABNORMAL HIGH (ref 101–111)
Creatinine, Ser: 1.53 mg/dL — ABNORMAL HIGH (ref 0.61–1.24)
GFR calc Af Amer: 49 mL/min — ABNORMAL LOW (ref >60)
GFR calc non Af Amer: 42 mL/min — ABNORMAL LOW (ref >60)
GFR calc non Af Amer: 46 mL/min — ABNORMAL LOW (ref >60)
GFR, EST AFRICAN AMERICAN: 53 mL/min — AB (ref >60)
Glucose, Bld: 169 mg/dL — ABNORMAL HIGH (ref 65–99)
Glucose, Bld: 169 mg/dL — ABNORMAL HIGH (ref 65–99)
Potassium: 2.3 mmol/L — CL (ref 3.5–5.1)
Potassium: 3.2 mmol/L — ABNORMAL LOW (ref 3.5–5.1)
Sodium: 150 mmol/L — ABNORMAL HIGH (ref 135–145)
Sodium: 146 mmol/L — ABNORMAL HIGH (ref 135–145)

## 2017-09-03 LAB — CULTURE, BLOOD (ROUTINE X 2): Special Requests: ADEQUATE

## 2017-09-03 LAB — CBC
HEMATOCRIT: 29 % — AB (ref 39.0–52.0)
Hemoglobin: 8.3 g/dL — ABNORMAL LOW (ref 13.0–17.0)
MCH: 21 pg — ABNORMAL LOW (ref 26.0–34.0)
MCHC: 28.6 g/dL — AB (ref 30.0–36.0)
MCV: 73.4 fL — AB (ref 78.0–100.0)
Platelets: 294 10*3/uL (ref 150–400)
RBC: 3.95 MIL/uL — ABNORMAL LOW (ref 4.22–5.81)
RDW: 25.1 % — AB (ref 11.5–15.5)
WBC: 10.4 10*3/uL (ref 4.0–10.5)

## 2017-09-03 LAB — URINE CULTURE: Culture: >=100000 — AB

## 2017-09-03 LAB — LACTIC ACID, PLASMA: LACTIC ACID, VENOUS: 1.4 mmol/L (ref 0.5–1.9)

## 2017-09-03 LAB — FERRITIN: Ferritin: 1237 ng/mL — ABNORMAL HIGH (ref 24–336)

## 2017-09-03 LAB — MAGNESIUM: Magnesium: 1.6 mg/dL — ABNORMAL LOW (ref 1.7–2.4)

## 2017-09-03 MED ORDER — MAGNESIUM SULFATE 2 GM/50ML IV SOLN
2.0000 g | Freq: Once | INTRAVENOUS | Status: AC
  Administered 2017-09-03: 2 g via INTRAVENOUS
  Filled 2017-09-03: qty 50

## 2017-09-03 MED ORDER — POTASSIUM CHLORIDE 10 MEQ/100ML IV SOLN
10.0000 meq | INTRAVENOUS | Status: AC
  Administered 2017-09-03 (×6): 10 meq via INTRAVENOUS
  Filled 2017-09-03 (×6): qty 100

## 2017-09-03 MED ORDER — KCL IN DEXTROSE-NACL 40-5-0.45 MEQ/L-%-% IV SOLN: INTRAVENOUS | Status: DC

## 2017-09-03 MED ORDER — SODIUM CHLORIDE 0.9 % IV SOLN
1.0000 g | Freq: Three times a day (TID) | INTRAVENOUS | Status: DC
  Administered 2017-09-03 – 2017-09-13 (×31): 1 g via INTRAVENOUS
  Filled 2017-09-03 (×32): qty 1

## 2017-09-03 MED ORDER — KCL IN DEXTROSE-NACL 20-5-0.45 MEQ/L-%-% IV SOLN
INTRAVENOUS | Status: DC
  Administered 2017-09-03 (×2): via INTRAVENOUS
  Filled 2017-09-03 (×2): qty 1000

## 2017-09-03 MED ORDER — RIVAROXABAN 20 MG PO TABS
20.0000 mg | ORAL_TABLET | Freq: Every day | ORAL | Status: DC
  Administered 2017-09-03: 20 mg via ORAL
  Filled 2017-09-03: qty 1

## 2017-09-03 NOTE — Progress Notes (Signed)
Attending MD text paged about critical calcium of 6.1

## 2017-09-03 NOTE — Progress Notes (Addendum)
Pharmacy Antibiotic Note  [name] is a 67 y.o. male admitted on 09/01/2017 with sepsis.  Pharmacy has been consulted for meropenem dosing for ESBL-E. Coli in urine and blood cultures.   Pt on Xarelto PTA for hx AFib initially held, now to resume per pharmacy.  Plan: -Stop ceftriaxone -Meropenem 1g IV q8h -Monitor LOT, renal funx -Xarelto 20mg  daily -Stop enoxaparin  Height: 6\' 3"  (190.5 cm) Weight: 281 lb 1.4 oz (127.5 kg) IBW/kg (Calculated) : 84.5  Temp (24hrs), Avg:98.9 F (37.2 C), Min:97.7 F (36.5 C), Max:101.3 F (38.5 C)  Recent Labs  Lab 09/01/17 0237 09/01/17 0251 09/01/17 0432 09/01/17 0529 09/02/17 0752 09/02/17 1138 09/02/17 1452 09/03/17 0239  WBC 35.5*  --   --   --  15.5*  --  12.0* 10.4  CREATININE 1.87*  --   --   --  1.91*  --  1.81* 1.65*  LATICACIDVEN  --  5.94* 3.09* 3.2*  --  1.8  --  1.4    Estimated Creatinine Clearance: 64.2 mL/min (A) (by C-G formula based on SCr of 1.65 mg/dL (H)).    No Known Allergies  Antimicrobials this admission: Meropenem 2/23 >> Ceftriaxone 2/21>>2/23 Zosyn x 1 on 2/21  Dose adjustments this admission: none  Microbiology results: 2/21 blood x 2 - ESBL-E. coli 2/21 urine - > 100 K/ml ESBL-E.coli 2/21 MRSA PCR positive 2/21 Influeza PCR negative  Thank you for allowing pharmacy to be a part of this patient's care.  3/21, PharmD, BCPS PGY-2 Cardiology Pharmacy Resident Pager: [phone] 09/03/2017

## 2017-09-03 NOTE — Progress Notes (Signed)
PROGRESS NOTE  [name]:[id number] DOB: Jan 02, 1951 DOA: 09/01/2017 PCP: [name], MD (Inactive)  HPI/Recap of past 80 hours: 67 year old male with past medical history of atrial fibrillation on xarelto, DM, diastolic heart failure, OSA, stage III CKD and status post right AKA admitted on the early morning of 2/21 from his nursing facility with fever and acute encephalopathy. Patient has had previous hospitalizations for acute urinary retention leading to UTI and sepsis. In the emergency room, patient found to be in sepsis secondary to UTI again, Afib with RVR. Pt was started on diltiazem ggt and admitted in the ICU for further management.  Today, pt was noted to restless, when asked how he was doing, he said "I just want some ginger ale". Pt denies any chest pain, worsening SOB, abdominal pain, N/V.   Assessment/Plan: Principal Problem:   UTI (urinary tract infection) Active Problems:   Hyperlipidemia   HYPOKALEMIA   Obesity, Class III, BMI 40-49.9 (morbid obesity) (HCC)   ANEMIA   CAD S/P percutaneous coronary angioplasty   Long term (current) use of anticoagulants   Chronic diastolic congestive heart failure (HCC)   CKD (chronic kidney disease) stage 3, GFR 30-59 ml/min (HCC)   Atrial fibrillation with RVR (HCC)   Sepsis, unspecified organism (HCC)   Pressure ulcer   Acute metabolic encephalopathy   S/P AKA (above knee amputation) unilateral, right (HCC)   Sepsis secondary to ESBL bacteremia Last temp spike on 2/22, resolved leukocytosis, BP improving Lactic acidosis resolved, procalcitonin 1.56 BC X 2 grew ESBL, will repeat tomorrow Spoke to ID, plan to treat for about 2 weeks or less, pending repeat BC and clinical course Start Meropenem, pharmacy to dose IVFs Monitor closely  UTI due to ESBL Recurrent, hx of urinary retension UC grew ESBL Foley in, due to retension May need urology work up after bacteremia clears Start Meropenem  Acute  metabolic encephalopathy  Likely due to above Management as above  Acute kidney injury in the setting of CKD stage 3 Baseline Cr ~ 1.4 Improving Likely due to above Continue IVF, avoid nephrotoxics  Chronic Atrial fibrillation with RVR In Afib with HR ~110 Likely due to above, sepsis Continue Cardizem gtt, plan to wean off Chads 2 score of 5, continue xarelto Held home diltiazem, lopressor  Chronic diastolic congestive heart failure Appears vol depleted due to sepsis BNP 1,401 elevated from baseline Will continue gentle hydration for 1 more day and re-evaluate  Hypernatremia/Hypokalemia/Hypomagnesemia  Continue D5/1/2NS with K+ Replace K+, Mg prn  Microcytic anemia Baseline hgb ~ 8 Iron panel pending Monitor closely as pt on AC  DM type 2 A1c 9.6 on 10/18 CBG stable SSI, detemir, held home insulin regimen  CAD S/P percutaneous coronary angioplasty  Stable  Sacral/decubitus ulcer Wound care consult    Morbid Obesity Lifestyle modification   Code Status: Full  Family Communication: None at bedside  Disposition Plan: Back to SNF once stable   Consultants:  None  Procedures:  None  Antimicrobials:  IV Meropenem  S/P IV ceftriaxone   DVT prophylaxis:  Xarelto   Objective: Vitals:   09/03/17 0200 09/03/17 0300 09/03/17 0500 09/03/17 0600  BP: 94/60 113/74 131/73 126/65  Pulse: 98 (!) 107 (!) 108 89  Resp: (!) 22 (!) 28 (!) 25 (!) 29  Temp:  98 F (36.7 C)    TempSrc:  Oral    SpO2: 95% 97% 100% 100%  Weight:   127.5 kg (281 lb 1.4 oz)   Height:  Intake/Output Summary (Last 24 hours) at 09/03/2017 1039 Last data filed at 09/03/2017 0800 Gross per 24 hour  Intake 1177.08 ml  Output 3050 ml  Net -1872.92 ml   Filed Weights   09/02/17 0505 09/02/17 0728 09/03/17 0500  Weight: 114.8 kg (253 lb) 121 kg (266 lb 12.8 oz) 127.5 kg (281 lb 1.4 oz)    Exam:   General: Mild distress, restless  Cardiovascular: S1, S2,  irregular  Respiratory: Poor inspiratory effort, CTA   Abdomen: Soft, NT, ND, BS+   Musculoskeletal: R AKA, 1+ pitting edema on LLE  Skin: Chronic venous stasis on LLE, stage IV sacral/decubitus ulcer  Psychiatry: Confused   Data Reviewed: CBC: Recent Labs  Lab 09/01/17 0237 09/02/17 0752 09/02/17 1452 09/03/17 0239  WBC 35.5* 15.5* 12.0* 10.4  NEUTROABS 31.6*  --   --   --   HGB 9.7* 8.3* 8.1* 8.3*  HCT 32.9* 29.0* 28.0* 29.0*  MCV 73.4* 72.3* 73.1* 73.4*  PLT 505* 352 341 294   Basic Metabolic Panel: Recent Labs  Lab 09/01/17 0237 09/02/17 0752 09/02/17 1452 09/03/17 0239  NA 149* 154* 154* 150*  K 2.2* 3.0* 2.1* 2.3*  CL 107 119* 120* 115*  CO2 23 19* 22 22  GLUCOSE 176* 212* 170* 169*  BUN 10 16 17 17   CREATININE 1.87* 1.91* 1.81* 1.65*  CALCIUM 7.8* 7.2* 7.0* 6.6*  MG  --  1.3*  --  1.6*   GFR: Estimated Creatinine Clearance: 64.2 mL/min (A) (by C-G formula based on SCr of 1.65 mg/dL (H)). Liver Function Tests: Recent Labs  Lab 09/01/17 0237  AST 38  ALT 12*  ALKPHOS 132*  BILITOT 1.6*  PROT 7.7  ALBUMIN 2.0*   No results for input(s): LIPASE, AMYLASE in the last 168 hours. No results for input(s): AMMONIA in the last 168 hours. Coagulation Profile: Recent Labs  Lab 09/01/17 0237  INR 1.49   Cardiac Enzymes: No results for input(s): CKTOTAL, CKMB, CKMBINDEX, TROPONINI in the last 168 hours. BNP (last 3 results) No results for input(s): PROBNP in the last 8760 hours. HbA1C: No results for input(s): HGBA1C in the last 72 hours. CBG: Recent Labs  Lab 09/02/17 0827 09/02/17 1305 09/02/17 1639 09/02/17 2133 09/03/17 0808  GLUCAP 187* 141* 162* 159* 162*   Lipid Profile: No results for input(s): CHOL, HDL, LDLCALC, TRIG, CHOLHDL, LDLDIRECT in the last 72 hours. Thyroid Function Tests: No results for input(s): TSH, T4TOTAL, FREET4, T3FREE, THYROIDAB in the last 72 hours. Anemia Panel: No results for input(s): VITAMINB12, FOLATE,  FERRITIN, TIBC, IRON, RETICCTPCT in the last 72 hours. Urine analysis:    Component Value Date/Time   COLORURINE AMBER (A) 09/01/2017 0237   APPEARANCEUR CLOUDY (A) 09/01/2017 0237   LABSPEC 1.006 09/01/2017 0237   PHURINE 7.0 09/01/2017 0237   GLUCOSEU NEGATIVE 09/01/2017 0237   HGBUR MODERATE (A) 09/01/2017 0237   BILIRUBINUR NEGATIVE 09/01/2017 0237   KETONESUR NEGATIVE 09/01/2017 0237   PROTEINUR 100 (A) 09/01/2017 0237   UROBILINOGEN 1.0 11/27/2014 0755   NITRITE POSITIVE (A) 09/01/2017 0237   LEUKOCYTESUR LARGE (A) 09/01/2017 0237   Sepsis Labs: @LABRCNTIP (procalcitonin:4,lacticidven:4)  ) Recent Results (from the past 240 hour(s))  Culture, blood (Routine x 2)     Status: Abnormal   Collection Time: 09/01/17  2:37 AM  Result Value Ref Range Status   Specimen Description BLOOD SITE NOT SPECIFIED  Final   Special Requests BOTTLES DRAWN AEROBIC AND ANAEROBIC  Final   Culture  Setup Time  Final    GRAM NEGATIVE RODS IN BOTH AEROBIC AND ANAEROBIC BOTTLES CRITICAL RESULT CALLED TO, READ BACK BY AND VERIFIED WITH: L BAJBUS PHARMD 2230 09/01/17 A BROWNING    Culture (A)  Final    ESCHERICHIA COLI SUSCEPTIBILITIES PERFORMED ON PREVIOUS CULTURE WITHIN THE LAST 5 DAYS. Performed at [facility], [address] [place], [place] [address]    Report Status 09/03/2017 FINAL  Final  Urine culture     Status: Abnormal   Collection Time: 09/01/17  2:37 AM  Result Value Ref Range Status   Specimen Description URINE, CATHETERIZED  Final   Special Requests NONE  Final   Culture (A)  Final    >=100,000 COLONIES/mL ESCHERICHIA COLI Confirmed Extended Spectrum Beta-Lactamase Producer (ESBL).  In bloodstream infections from ESBL organisms, carbapenems are preferred over piperacillin/tazobactam. They are shown to have a lower risk of mortality. Performed at [facility], [address] [place], [place] [address]    Report Status 09/03/2017 FINAL  Final   Organism ID,  Bacteria ESCHERICHIA COLI (A)  Final      Susceptibility   Escherichia coli - MIC*    AMPICILLIN >=32 RESISTANT Resistant     CEFAZOLIN >=64 RESISTANT Resistant     CEFTRIAXONE >=64 RESISTANT Resistant     CIPROFLOXACIN >=4 RESISTANT Resistant     GENTAMICIN >=16 RESISTANT Resistant     IMIPENEM <=0.25 SENSITIVE Sensitive     NITROFURANTOIN <=16 SENSITIVE Sensitive     TRIMETH/SULFA <=20 SENSITIVE Sensitive     AMPICILLIN/SULBACTAM >=32 RESISTANT Resistant     PIP/TAZO 8 SENSITIVE Sensitive     Extended ESBL POSITIVE Resistant     * >=100,000 COLONIES/mL ESCHERICHIA COLI  Culture, blood (Routine x 2)     Status: Abnormal   Collection Time: 09/01/17  2:42 AM  Result Value Ref Range Status   Specimen Description BLOOD SITE NOT SPECIFIED  Final   Special Requests   Final    Blood Culture adequate volume BOTTLES DRAWN AEROBIC AND ANAEROBIC   Culture  Setup Time   Final    GRAM NEGATIVE RODS IN BOTH AEROBIC AND ANAEROBIC BOTTLES CRITICAL RESULT CALLED TO, READ BACK BY AND VERIFIED WITH: L BAJBUS PHARMD 2230 09/01/17 A BROWNING    Culture (A)  Final    ESCHERICHIA COLI Confirmed Extended Spectrum Beta-Lactamase Producer (ESBL).  In bloodstream infections from ESBL organisms, carbapenems are preferred over piperacillin/tazobactam. They are shown to have a lower risk of mortality. Performed at [facility], [address] [place], [place] [address]    Report Status 09/03/2017 FINAL  Final   Organism ID, Bacteria ESCHERICHIA COLI  Final      Susceptibility   Escherichia coli - MIC*    AMPICILLIN >=32 RESISTANT Resistant     CEFAZOLIN >=64 RESISTANT Resistant     CEFEPIME RESISTANT Resistant     CEFTAZIDIME RESISTANT Resistant     CEFTRIAXONE >=64 RESISTANT Resistant     CIPROFLOXACIN >=4 RESISTANT Resistant     GENTAMICIN >=16 RESISTANT Resistant     IMIPENEM <=0.25 SENSITIVE Sensitive     TRIMETH/SULFA <=20 SENSITIVE Sensitive     AMPICILLIN/SULBACTAM >=32 RESISTANT  Resistant     PIP/TAZO 8 SENSITIVE Sensitive     Extended ESBL POSITIVE Resistant     * ESCHERICHIA COLI  Blood Culture ID Panel (Reflexed)     Status: Abnormal   Collection Time: 09/01/17  2:42 AM  Result Value Ref Range Status   Enterococcus  species NOT DETECTED NOT DETECTED Final   Listeria monocytogenes NOT DETECTED NOT DETECTED Final   Staphylococcus species NOT DETECTED NOT DETECTED Final   Staphylococcus aureus NOT DETECTED NOT DETECTED Final   Streptococcus species NOT DETECTED NOT DETECTED Final   Streptococcus agalactiae NOT DETECTED NOT DETECTED Final   Streptococcus pneumoniae NOT DETECTED NOT DETECTED Final   Streptococcus pyogenes NOT DETECTED NOT DETECTED Final   Acinetobacter baumannii NOT DETECTED NOT DETECTED Final   Enterobacteriaceae species DETECTED (A) NOT DETECTED Final    Comment: Enterobacteriaceae represent a large family of gram-negative bacteria, not a single organism. CRITICAL RESULT CALLED TO, READ BACK BY AND VERIFIED WITH: L BAJBUS PHARMD 2230 09/01/17 A BROWNING    Enterobacter cloacae complex NOT DETECTED NOT DETECTED Final   Escherichia coli DETECTED (A) NOT DETECTED Final    Comment: CRITICAL RESULT CALLED TO, READ BACK BY AND VERIFIED WITH: L BAJBUS PHARMD 2230 09/01/17 A BROWNING    Klebsiella oxytoca NOT DETECTED NOT DETECTED Final   Klebsiella pneumoniae NOT DETECTED NOT DETECTED Final   Proteus species NOT DETECTED NOT DETECTED Final   Serratia marcescens NOT DETECTED NOT DETECTED Final   Carbapenem resistance NOT DETECTED NOT DETECTED Final   Haemophilus influenzae NOT DETECTED NOT DETECTED Final   Neisseria meningitidis NOT DETECTED NOT DETECTED Final   Pseudomonas aeruginosa NOT DETECTED NOT DETECTED Final   Candida albicans NOT DETECTED NOT DETECTED Final   Candida glabrata NOT DETECTED NOT DETECTED Final   Candida krusei NOT DETECTED NOT DETECTED Final   Candida parapsilosis NOT DETECTED NOT DETECTED Final   Candida tropicalis NOT  DETECTED NOT DETECTED Final    Comment: Performed at [facility], [address] [place], [place] [address]  MRSA PCR Screening     Status: Abnormal   Collection Time: 09/01/17  9:48 PM  Result Value Ref Range Status   MRSA by PCR POSITIVE (A) NEGATIVE Final    Comment:        The GeneXpert MRSA Assay (FDA approved for NASAL specimens only), is one component of a comprehensive MRSA colonization surveillance program. It is not intended to diagnose MRSA infection nor to guide or monitor treatment for MRSA infections. RESULT CALLED TO, READ BACK BY AND VERIFIED WITH: J.MILLER,RN AT 0041 09/02/17 L.PITT       Studies: No results found.  Scheduled Meds: . aspirin  300 mg Rectal Daily  . Chlorhexidine Gluconate Cloth  6 each Topical Q0600  . enoxaparin (LOVENOX) injection  40 mg Subcutaneous Daily  . insulin aspart  0-9 Units Subcutaneous TID WC  . insulin detemir  5 Units Subcutaneous BID  . metoprolol tartrate  5 mg Intravenous Q8H  . mupirocin ointment  1 application Nasal BID    Continuous Infusions: . cefTRIAXone (ROCEPHIN)  IV Stopped (09/03/17 1051)  . dextrose 5 % and 0.45 % NaCl with KCl 20 mEq/L 100 mL/hr at 09/03/17 2376  . diltiazem (CARDIZEM) infusion 10 mg/hr (09/03/17 0831)  . potassium chloride Stopped (09/03/17 1051)     LOS: 2 days     Briant Cedar, MD [name]   If 7PM-7AM, please contact night-coverage [email or web address] Password [facility] 09/03/2017, 10:39 AM

## 2017-09-04 ENCOUNTER — Inpatient Hospital Stay (HOSPITAL_COMMUNITY): Payer: Medicare Other

## 2017-09-04 LAB — BLOOD GAS, ARTERIAL
Acid-Base Excess: 3.3 mmol/L — ABNORMAL HIGH (ref 0.0–2.0)
BICARBONATE: 26.3 mmol/L (ref 20.0–28.0)
Drawn by: 213381
O2 CONTENT: 2 L/min
O2 SAT: 88.1 %
PATIENT TEMPERATURE: 98.6
pCO2 arterial: 32.7 mmHg (ref 32.0–48.0)
pH, Arterial: 7.516 — ABNORMAL HIGH (ref 7.350–7.450)
pO2, Arterial: 53.9 mmHg — ABNORMAL LOW (ref 83.0–108.0)

## 2017-09-04 LAB — CBC WITH DIFFERENTIAL/PLATELET
BASOS ABS: 0 10*3/uL (ref 0.0–0.1)
Basophils Relative: 0 %
Eosinophils Absolute: 0.5 10*3/uL (ref 0.0–0.7)
Eosinophils Relative: 5 %
HEMATOCRIT: 26.2 % — AB (ref 39.0–52.0)
HEMOGLOBIN: 7.5 g/dL — AB (ref 13.0–17.0)
LYMPHS PCT: 16 %
Lymphs Abs: 1.7 10*3/uL (ref 0.7–4.0)
MCH: 20.8 pg — ABNORMAL LOW (ref 26.0–34.0)
MCHC: 28.6 g/dL — AB (ref 30.0–36.0)
MCV: 72.6 fL — ABNORMAL LOW (ref 78.0–100.0)
MONOS PCT: 9 %
Monocytes Absolute: 1 10*3/uL (ref 0.1–1.0)
NEUTROS ABS: 7.7 10*3/uL (ref 1.7–7.7)
Neutrophils Relative %: 70 %
Platelets: 263 10*3/uL (ref 150–400)
RBC: 3.61 MIL/uL — ABNORMAL LOW (ref 4.22–5.81)
RDW: 24.9 % — ABNORMAL HIGH (ref 11.5–15.5)
WBC: 10.9 10*3/uL — ABNORMAL HIGH (ref 4.0–10.5)

## 2017-09-04 LAB — MAGNESIUM: MAGNESIUM: 1.8 mg/dL (ref 1.7–2.4)

## 2017-09-04 LAB — C DIFFICILE QUICK SCREEN W PCR REFLEX
C DIFFICILE (CDIFF) INTERP: NOT DETECTED
C Diff antigen: NEGATIVE
C Diff toxin: NEGATIVE

## 2017-09-04 LAB — BASIC METABOLIC PANEL
Anion gap: 10 (ref 5–15)
BUN: 12 mg/dL (ref 6–20)
CHLORIDE: 110 mmol/L (ref 101–111)
CO2: 22 mmol/L (ref 22–32)
CREATININE: 1.41 mg/dL — AB (ref 0.61–1.24)
Calcium: 6.3 mg/dL — CL (ref 8.9–10.3)
GFR calc non Af Amer: 51 mL/min — ABNORMAL LOW (ref >60)
GFR, EST AFRICAN AMERICAN: 59 mL/min — AB (ref >60)
Glucose, Bld: 345 mg/dL — ABNORMAL HIGH (ref 65–99)
Potassium: 3 mmol/L — ABNORMAL LOW (ref 3.5–5.1)
Sodium: 142 mmol/L (ref 135–145)

## 2017-09-04 LAB — GLUCOSE, CAPILLARY
Glucose-Capillary: 202 mg/dL — ABNORMAL HIGH (ref 65–99)
Glucose-Capillary: 209 mg/dL — ABNORMAL HIGH (ref 65–99)
Glucose-Capillary: 137 mg/dL — ABNORMAL HIGH (ref 65–99)
Glucose-Capillary: 119 mg/dL — ABNORMAL HIGH (ref 65–99)

## 2017-09-04 LAB — TYPE AND SCREEN
ABO/RH(D): A NEG
Antibody Screen: NEGATIVE

## 2017-09-04 LAB — CALCIUM, IONIZED: Calcium, Ionized, Serum: 3.5 mg/dL — ABNORMAL LOW (ref 4.5–5.6)

## 2017-09-04 LAB — OCCULT BLOOD X 1 CARD TO LAB, STOOL: FECAL OCCULT BLD: NEGATIVE

## 2017-09-04 MED ORDER — FUROSEMIDE 10 MG/ML IJ SOLN
40.0000 mg | Freq: Once | INTRAMUSCULAR | Status: AC
  Administered 2017-09-04: 40 mg via INTRAVENOUS
  Filled 2017-09-04: qty 4

## 2017-09-04 MED ORDER — POTASSIUM CHLORIDE IN NACL 20-0.45 MEQ/L-% IV SOLN
INTRAVENOUS | Status: DC
  Filled 2017-09-04: qty 1000

## 2017-09-04 MED ORDER — INSULIN DETEMIR 100 UNIT/ML ~~LOC~~ SOLN
5.0000 [IU] | Freq: Two times a day (BID) | SUBCUTANEOUS | Status: DC
  Administered 2017-09-04 – 2017-09-07 (×7): 5 [IU] via SUBCUTANEOUS
  Filled 2017-09-04 (×8): qty 0.05

## 2017-09-04 MED ORDER — RIVAROXABAN 20 MG PO TABS
20.0000 mg | ORAL_TABLET | Freq: Every day | ORAL | Status: DC
  Administered 2017-09-04 – 2017-09-12 (×9): 20 mg via ORAL
  Filled 2017-09-04 (×9): qty 1

## 2017-09-04 MED ORDER — FUROSEMIDE 10 MG/ML IJ SOLN
40.0000 mg | Freq: Two times a day (BID) | INTRAMUSCULAR | Status: AC
  Administered 2017-09-04 – 2017-09-05 (×3): 40 mg via INTRAVENOUS
  Filled 2017-09-04 (×3): qty 4

## 2017-09-04 MED ORDER — OXYCODONE-ACETAMINOPHEN 5-325 MG PO TABS
1.0000 | ORAL_TABLET | Freq: Three times a day (TID) | ORAL | Status: DC | PRN
  Administered 2017-09-04: 1 via ORAL
  Filled 2017-09-04: qty 1

## 2017-09-04 MED ORDER — INSULIN DETEMIR 100 UNIT/ML ~~LOC~~ SOLN: 10.0000 [IU] | Freq: Two times a day (BID) | SUBCUTANEOUS | Status: DC

## 2017-09-04 MED ORDER — CALCIUM CARBONATE 1250 (500 CA) MG PO TABS
1.0000 | ORAL_TABLET | Freq: Three times a day (TID) | ORAL | Status: DC
  Administered 2017-09-04 – 2017-09-13 (×19): 500 mg via ORAL
  Filled 2017-09-04 (×29): qty 1

## 2017-09-04 MED ORDER — POTASSIUM CHLORIDE 10 MEQ/100ML IV SOLN
10.0000 meq | INTRAVENOUS | Status: AC
  Administered 2017-09-04 (×4): 10 meq via INTRAVENOUS
  Filled 2017-09-04 (×4): qty 100

## 2017-09-04 NOTE — Progress Notes (Signed)
Late entry.  Noted PO2 low on recent ABG, increased [place] to 4L.  Unable to determine SpO2 at this time.  Notified RN

## 2017-09-04 NOTE — Progress Notes (Signed)
Pt anxious, crying, stating "why, why, why" Pt states I'm miserable."  Pt stating "please help me."  Pt unable to verbalize needs when RN assesses pt.  Pt denies dyspnea, SOB, or chest pain.  Pt begins crying and states "I don't know."  PRN ativan given for anxiety.

## 2017-09-04 NOTE — Progress Notes (Signed)
Pt yelling out for help. Vitals stable. Pt states he is hurting. Pt unable to verbalize location and type of pain. Ativan given and MD notified. Will continue to monitor.

## 2017-09-04 NOTE — Progress Notes (Signed)
CRITICAL VALUE ALERT  Critical Value:  Ca 6.3  Date & Time Notied:  0349 2/24  Provider Notified: X. Blount, NP  Orders Received/Actions taken: No additional orders received

## 2017-09-04 NOTE — Progress Notes (Signed)
[name]:[id number] DOB: [birth year] DOA: 09/01/2017 PCP: [name], MD (Inactive)  HPI/Recap of past 19 hours: 67 year old male with past medical history of atrial fibrillation on xarelto, DM, diastolic heart failure, OSA, stage III CKD and status post right AKA admitted on the early morning of 2/21 from his nursing facility with fever and acute encephalopathy. Patient has had previous hospitalizations for acute urinary retention leading to UTI and sepsis. In the emergency room, patient found to be in sepsis secondary to UTI again, Afib with RVR. Pt was started on diltiazem ggt and admitted in the ICU for further management.  Today, pt was noted to restless, with increased WOB. Pt oriented to person and place. Pt reporting he wants "pepsi-cola", denies any focal pain, but constantly moaning. Pt with diarrhea, flexiseal draining liquid urine.   Assessment/Plan: Principal Problem:   UTI (urinary tract infection) Active Problems:   Hyperlipidemia   HYPOKALEMIA   Obesity, Class III, BMI 40-49.9 (morbid obesity) (HCC)   ANEMIA   CAD S/P percutaneous coronary angioplasty   Long term (current) use of anticoagulants   Chronic diastolic congestive heart failure (HCC)   CKD (chronic kidney disease) stage 3, GFR 30-59 ml/min (HCC)   Atrial fibrillation with RVR (HCC)   Sepsis, unspecified organism (HCC)   Pressure ulcer   Acute metabolic encephalopathy   S/P AKA (above knee amputation) unilateral, right (HCC)   Sepsis secondary to ESBL bacteremia Last temp spike on 2/22, improving leukocytosis, BP still soft Lactic acidosis resolved, procalcitonin 1.56 BC X 2 grew ESBL, repeat pending Spoke to ID, plan to treat for about 2 weeks or less, pending repeat BC and clinical course Continue meropenem, pharmacy to dose D/C IVFs due to concerns of vascular congestion Monitor closely  UTI due to ESBL Recurrent, hx of urinary retension UC grew ESBL Foley in,  due to retension May need urology work up after bacteremia clears Continue Meropenem  Acute metabolic encephalopathy  Likely due to above CT head on admission negative Management as above  Acute on Chronic diastolic heart failure Appears overloaded, s/p IV hydration for sepsis BNP 1,401 elevated from baseline CXR: CHF with pulm edema Stop IVF, start IV lasix with close monitoring of BP and renal fxn Strict I&O, daily weight  Acute kidney injury in the setting of CKD stage 3 Baseline Cr ~ 1.4 Improving Likely due to above Avoid nephrotoxics  Chronic Atrial fibrillation with RVR In Afib with HR ~110 Likely due to above, sepsis Continue Cardizem gtt, plan to wean off Chads 2 score of 5, continue xarelto Held home diltiazem, lopressor  Hypokalemia/hypocalcemia  Replace K+ prn, Ca  Microcytic anemia Baseline hgb ~ 8 Iron panel: Fe 33, TIBC 113, sats 29, Ferritin 1,237 FOBT negative Monitor closely as pt on AC  Diarrhea Multiple episode, liquid stool, flexi-seal placed C.diff negative  DM type 2 A1c 9.6 on 10/18 CBG stable SSI, detemir, held home insulin regimen  CAD S/P percutaneous coronary angioplasty  Stable  Sacral/decubitus ulcer Wound care consult    Morbid Obesity Lifestyle modification   Code Status: Full  Family Communication: None at bedside  Disposition Plan: Back to SNF once stable   Consultants:  None  Procedures:  None  Antimicrobials:  IV Meropenem  S/P IV ceftriaxone   DVT prophylaxis:  Xarelto   Objective: Vitals:   09/04/17 0900 09/04/17 1000 09/04/17 1100 09/04/17 1200  BP: 90/73 (!) 93/58  109/78  Pulse: (!) 111 (!) 103  (!) 128  Resp: (!) 31 19  (!) 30  Temp:   98.1 F (36.7 C)   TempSrc:   Oral   SpO2:   98%   Weight:      Height:        Intake/Output Summary (Last 24 hours) at 09/04/2017 1303 Last data filed at 09/04/2017 1230 Gross per 24 hour  Intake 2396.33 ml  Output 3200 ml  Net -803.67 ml    Filed Weights   09/02/17 0728 09/03/17 0500 09/04/17 0317  Weight: 121 kg (266 lb 12.8 oz) 127.5 kg (281 lb 1.4 oz) 127 kg (280 lb)    Exam:   General: Mild distress, restless, increased WOB  Cardiovascular: S1, S2, irregular  Respiratory: Poor inspiratory effort, CTA   Abdomen: Soft, NT, obese, BS+   Musculoskeletal: R AKA, 1+ pitting edema on LLE  Skin: Chronic venous stasis on LLE, stage IV sacral/decubitus ulcer  Psychiatry: Confused   Data Reviewed: CBC: Recent Labs  Lab 09/01/17 0237 09/02/17 0752 09/02/17 1452 09/03/17 0239 09/04/17 0210  WBC 35.5* 15.5* 12.0* 10.4 10.9*  NEUTROABS 31.6*  --   --   --  7.7  HGB 9.7* 8.3* 8.1* 8.3* 7.5*  HCT 32.9* 29.0* 28.0* 29.0* 26.2*  MCV 73.4* 72.3* 73.1* 73.4* 72.6*  PLT 505* 352 341 294 263   Basic Metabolic Panel: Recent Labs  Lab 09/02/17 0752 09/02/17 1452 09/03/17 0239 09/03/17 1627 09/04/17 0210  NA 154* 154* 150* 146* 142  K 3.0* 2.1* 2.3* 3.2* 3.0*  CL 119* 120* 115* 106 110  CO2 19* 22 22 21* 22  GLUCOSE 212* 170* 169* 169* 345*  BUN 16 17 17 15 12   CREATININE 1.91* 1.81* 1.65* 1.53* 1.41*  CALCIUM 7.2* 7.0* 6.6* 6.1* 6.3*  MG 1.3*  --  1.6*  --  1.8   GFR: Estimated Creatinine Clearance: 75 mL/min (A) (by C-G formula based on SCr of 1.41 mg/dL (H)). Liver Function Tests: Recent Labs  Lab 09/01/17 0237  AST 38  ALT 12*  ALKPHOS 132*  BILITOT 1.6*  PROT 7.7  ALBUMIN 2.0*   No results for input(s): LIPASE, AMYLASE in the last 168 hours. No results for input(s): AMMONIA in the last 168 hours. Coagulation Profile: Recent Labs  Lab 09/01/17 0237  INR 1.49   Cardiac Enzymes: No results for input(s): CKTOTAL, CKMB, CKMBINDEX, TROPONINI in the last 168 hours. BNP (last 3 results) No results for input(s): PROBNP in the last 8760 hours. HbA1C: No results for input(s): HGBA1C in the last 72 hours. CBG: Recent Labs  Lab 09/03/17 1227 09/03/17 1614 09/03/17 2201 09/04/17 0756  09/04/17 1212  GLUCAP 172* 169* 191* 202* 209*   Lipid Profile: No results for input(s): CHOL, HDL, LDLCALC, TRIG, CHOLHDL, LDLDIRECT in the last 72 hours. Thyroid Function Tests: No results for input(s): TSH, T4TOTAL, FREET4, T3FREE, THYROIDAB in the last 72 hours. Anemia Panel: Recent Labs    09/03/17 1627  FERRITIN 1,237*  TIBC 113*  IRON 33*   Urine analysis:    Component Value Date/Time   COLORURINE AMBER (A) 09/01/2017 0237   APPEARANCEUR CLOUDY (A) 09/01/2017 0237   LABSPEC 1.006 09/01/2017 0237   PHURINE 7.0 09/01/2017 0237   GLUCOSEU NEGATIVE 09/01/2017 0237   HGBUR MODERATE (A) 09/01/2017 0237   BILIRUBINUR NEGATIVE 09/01/2017 0237   KETONESUR NEGATIVE 09/01/2017 0237   PROTEINUR 100 (A) 09/01/2017 0237   UROBILINOGEN 1.0 11/27/2014 0755   NITRITE POSITIVE (A) 09/01/2017 0237   LEUKOCYTESUR LARGE (A) 09/01/2017 09/03/2017  Sepsis Labs: @LABRCNTIP (procalcitonin:4,lacticidven:4)  ) Recent Results (from the past 240 hour(s))  Culture, blood (Routine x 2)     Status: Abnormal   Collection Time: 09/01/17  2:37 AM  Result Value Ref Range Status   Specimen Description BLOOD SITE NOT SPECIFIED  Final   Special Requests BOTTLES DRAWN AEROBIC AND ANAEROBIC  Final   Culture  Setup Time   Final    GRAM NEGATIVE RODS IN BOTH AEROBIC AND ANAEROBIC BOTTLES CRITICAL RESULT CALLED TO, READ BACK BY AND VERIFIED WITH: L BAJBUS PHARMD 2230 09/01/17 A BROWNING    Culture (A)  Final    ESCHERICHIA COLI SUSCEPTIBILITIES PERFORMED ON PREVIOUS CULTURE WITHIN THE LAST 5 DAYS. Performed at [facility], [address] [place], [place] [address]    Report Status 09/03/2017 FINAL  Final  Urine culture     Status: Abnormal   Collection Time: 09/01/17  2:37 AM  Result Value Ref Range Status   Specimen Description URINE, CATHETERIZED  Final   Special Requests NONE  Final   Culture (A)  Final    >=100,000 COLONIES/mL ESCHERICHIA COLI Confirmed Extended Spectrum Beta-Lactamase  Producer (ESBL).  In bloodstream infections from ESBL organisms, carbapenems are preferred over piperacillin/tazobactam. They are shown to have a lower risk of mortality. Performed at [facility], [address] [place], [place] [address]    Report Status 09/03/2017 FINAL  Final   Organism ID, Bacteria ESCHERICHIA COLI (A)  Final      Susceptibility   Escherichia coli - MIC*    AMPICILLIN >=32 RESISTANT Resistant     CEFAZOLIN >=64 RESISTANT Resistant     CEFTRIAXONE >=64 RESISTANT Resistant     CIPROFLOXACIN >=4 RESISTANT Resistant     GENTAMICIN >=16 RESISTANT Resistant     IMIPENEM <=0.25 SENSITIVE Sensitive     NITROFURANTOIN <=16 SENSITIVE Sensitive     TRIMETH/SULFA <=20 SENSITIVE Sensitive     AMPICILLIN/SULBACTAM >=32 RESISTANT Resistant     PIP/TAZO 8 SENSITIVE Sensitive     Extended ESBL POSITIVE Resistant     * >=100,000 COLONIES/mL ESCHERICHIA COLI  Culture, blood (Routine x 2)     Status: Abnormal   Collection Time: 09/01/17  2:42 AM  Result Value Ref Range Status   Specimen Description BLOOD SITE NOT SPECIFIED  Final   Special Requests   Final    Blood Culture adequate volume BOTTLES DRAWN AEROBIC AND ANAEROBIC   Culture  Setup Time   Final    GRAM NEGATIVE RODS IN BOTH AEROBIC AND ANAEROBIC BOTTLES CRITICAL RESULT CALLED TO, READ BACK BY AND VERIFIED WITH: L BAJBUS PHARMD 2230 09/01/17 A BROWNING    Culture (A)  Final    ESCHERICHIA COLI Confirmed Extended Spectrum Beta-Lactamase Producer (ESBL).  In bloodstream infections from ESBL organisms, carbapenems are preferred over piperacillin/tazobactam. They are shown to have a lower risk of mortality. Performed at [facility], [address], [place], [place] [address]    Report Status 09/03/2017 FINAL  Final   Organism ID, Bacteria ESCHERICHIA COLI  Final      Susceptibility   Escherichia coli - MIC*    AMPICILLIN >=32 RESISTANT Resistant     CEFAZOLIN >=64 RESISTANT Resistant     CEFEPIME RESISTANT  Resistant     CEFTAZIDIME RESISTANT Resistant     CEFTRIAXONE >=64 RESISTANT Resistant     CIPROFLOXACIN >=4 RESISTANT Resistant     GENTAMICIN >=16 RESISTANT Resistant     IMIPENEM <=0.25 SENSITIVE Sensitive  TRIMETH/SULFA <=20 SENSITIVE Sensitive     AMPICILLIN/SULBACTAM >=32 RESISTANT Resistant     PIP/TAZO 8 SENSITIVE Sensitive     Extended ESBL POSITIVE Resistant     * ESCHERICHIA COLI  Blood Culture ID Panel (Reflexed)     Status: Abnormal   Collection Time: 09/01/17  2:42 AM  Result Value Ref Range Status   Enterococcus species NOT DETECTED NOT DETECTED Final   Listeria monocytogenes NOT DETECTED NOT DETECTED Final   Staphylococcus species NOT DETECTED NOT DETECTED Final   Staphylococcus aureus NOT DETECTED NOT DETECTED Final   Streptococcus species NOT DETECTED NOT DETECTED Final   Streptococcus agalactiae NOT DETECTED NOT DETECTED Final   Streptococcus pneumoniae NOT DETECTED NOT DETECTED Final   Streptococcus pyogenes NOT DETECTED NOT DETECTED Final   Acinetobacter baumannii NOT DETECTED NOT DETECTED Final   Enterobacteriaceae species DETECTED (A) NOT DETECTED Final    Comment: Enterobacteriaceae represent a large family of gram-negative bacteria, not a single organism. CRITICAL RESULT CALLED TO, READ BACK BY AND VERIFIED WITH: L BAJBUS PHARMD 2230 09/01/17 A BROWNING    Enterobacter cloacae complex NOT DETECTED NOT DETECTED Final   Escherichia coli DETECTED (A) NOT DETECTED Final    Comment: CRITICAL RESULT CALLED TO, READ BACK BY AND VERIFIED WITH: L BAJBUS PHARMD 2230 09/01/17 A BROWNING    Klebsiella oxytoca NOT DETECTED NOT DETECTED Final   Klebsiella pneumoniae NOT DETECTED NOT DETECTED Final   Proteus species NOT DETECTED NOT DETECTED Final   Serratia marcescens NOT DETECTED NOT DETECTED Final   Carbapenem resistance NOT DETECTED NOT DETECTED Final   Haemophilus influenzae NOT DETECTED NOT DETECTED Final   Neisseria meningitidis NOT DETECTED NOT DETECTED Final    Pseudomonas aeruginosa NOT DETECTED NOT DETECTED Final   Candida albicans NOT DETECTED NOT DETECTED Final   Candida glabrata NOT DETECTED NOT DETECTED Final   Candida krusei NOT DETECTED NOT DETECTED Final   Candida parapsilosis NOT DETECTED NOT DETECTED Final   Candida tropicalis NOT DETECTED NOT DETECTED Final    Comment: Performed at [facility], [address], [place], [place] [address]  MRSA PCR Screening     Status: Abnormal   Collection Time: 09/01/17  9:48 PM  Result Value Ref Range Status   MRSA by PCR POSITIVE (A) NEGATIVE Final    Comment:        The GeneXpert MRSA Assay (FDA approved for NASAL specimens only), is one component of a comprehensive MRSA colonization surveillance program. It is not intended to diagnose MRSA infection nor to guide or monitor treatment for MRSA infections. RESULT CALLED TO, READ BACK BY AND VERIFIED WITH: J.MILLER,RN AT 0041 09/02/17 L.PITT   C difficile quick scan w PCR reflex     Status: None   Collection Time: 09/04/17 10:00 AM  Result Value Ref Range Status   C Diff antigen NEGATIVE NEGATIVE Final   C Diff toxin NEGATIVE NEGATIVE Final   C Diff interpretation No C. difficile detected.  Final    Comment: Performed at [facility], [address] [place], [place] [address]      Studies: Dg Chest Port 1 View  Result Date: 09/04/2017 CLINICAL DATA:  Dyspnea. EXAM: PORTABLE CHEST 1 VIEW COMPARISON:  09/01/2017 FINDINGS: Since the prior exam, there has been an increase in irregular interstitial thickening. There is hazy perihilar opacity, on the right extending into the right lower lung zone. No convincing pleural effusion.  No pneumothorax. Cardiac silhouette is mildly enlarged. IMPRESSION: 1. Congestive heart  failure with interstitial and mild central hazy airspace pulmonary edema, worsened when compared to the prior chest radiograph. Electronically Signed   By: Amie Portland M.D.   On: 09/04/2017 10:17    Scheduled  Meds: . Chlorhexidine Gluconate Cloth  6 each Topical Q0600  . furosemide  40 mg Intravenous BID  . insulin aspart  0-9 Units Subcutaneous TID WC  . insulin detemir  5 Units Subcutaneous BID  . mupirocin ointment  1 application Nasal BID    Continuous Infusions: . diltiazem (CARDIZEM) infusion 10 mg/hr (09/04/17 1227)  . meropenem (MERREM) IV Stopped (09/04/17 0657)  . potassium chloride 10 mEq (09/04/17 1230)     LOS: 3 days     Briant Cedar, MD [name]   If 7PM-7AM, please contact night-coverage [email or web address] Password [facility] 09/04/2017, 1:03 PM

## 2017-09-04 NOTE — Progress Notes (Signed)
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

## 2017-09-04 NOTE — Progress Notes (Signed)
Pt with multiple loose liquidy bowel movements.  Pt with sacral ulcer.  Linton Flemings notified.  Order received for indwelling fecal device.

## 2017-09-05 LAB — BASIC METABOLIC PANEL
ANION GAP: 14 (ref 5–15)
Anion gap: 14 (ref 5–15)
BUN: 10 mg/dL (ref 6–20)
BUN: 7 mg/dL (ref 6–20)
CHLORIDE: 109 mmol/L (ref 101–111)
CO2: 25 mmol/L (ref 22–32)
CO2: 26 mmol/L (ref 22–32)
CREATININE: 1.48 mg/dL — AB (ref 0.61–1.24)
Calcium: 7.3 mg/dL — ABNORMAL LOW (ref 8.9–10.3)
Calcium: 7.4 mg/dL — ABNORMAL LOW (ref 8.9–10.3)
Chloride: 112 mmol/L — ABNORMAL HIGH (ref 101–111)
Creatinine, Ser: 1.49 mg/dL — ABNORMAL HIGH (ref 0.61–1.24)
GFR calc Af Amer: 55 mL/min — ABNORMAL LOW (ref >60)
GFR calc non Af Amer: 48 mL/min — ABNORMAL LOW (ref >60)
GFR calc non Af Amer: 48 mL/min — ABNORMAL LOW (ref >60)
GFR, EST AFRICAN AMERICAN: 56 mL/min — AB (ref >60)
GLUCOSE: 195 mg/dL — AB (ref 65–99)
GLUCOSE: 173 mg/dL — AB (ref 65–99)
Potassium: 2.1 mmol/L — CL (ref 3.5–5.1)
Potassium: 2.3 mmol/L — CL (ref 3.5–5.1)
Sodium: 151 mmol/L — ABNORMAL HIGH (ref 135–145)
Sodium: 149 mmol/L — ABNORMAL HIGH (ref 135–145)

## 2017-09-05 LAB — PHOSPHORUS: PHOSPHORUS: 1.2 mg/dL — AB (ref 2.5–4.6)

## 2017-09-05 LAB — BLOOD CULTURE ID PANEL (REFLEXED)
Acinetobacter baumannii: NOT DETECTED
CANDIDA KRUSEI: NOT DETECTED
Candida albicans: NOT DETECTED
Candida glabrata: NOT DETECTED
Candida parapsilosis: NOT DETECTED
Candida tropicalis: NOT DETECTED
Carbapenem resistance: NOT DETECTED
ENTEROBACTERIACEAE SPECIES: DETECTED — AB
ENTEROCOCCUS SPECIES: NOT DETECTED
Enterobacter cloacae complex: NOT DETECTED
Escherichia coli: NOT DETECTED
HAEMOPHILUS INFLUENZAE: NOT DETECTED
KLEBSIELLA OXYTOCA: NOT DETECTED
Klebsiella pneumoniae: NOT DETECTED
LISTERIA MONOCYTOGENES: NOT DETECTED
Neisseria meningitidis: NOT DETECTED
PSEUDOMONAS AERUGINOSA: NOT DETECTED
Proteus species: NOT DETECTED
STAPHYLOCOCCUS AUREUS BCID: NOT DETECTED
STREPTOCOCCUS PNEUMONIAE: NOT DETECTED
STREPTOCOCCUS PYOGENES: NOT DETECTED
STREPTOCOCCUS SPECIES: NOT DETECTED
Serratia marcescens: NOT DETECTED
Staphylococcus species: NOT DETECTED
Streptococcus agalactiae: NOT DETECTED

## 2017-09-05 LAB — CBC WITH DIFFERENTIAL/PLATELET
BASOS ABS: 0 10*3/uL (ref 0.0–0.1)
Basophils Relative: 0 %
EOS PCT: 4 %
Eosinophils Absolute: 0.4 10*3/uL (ref 0.0–0.7)
HEMATOCRIT: 30 % — AB (ref 39.0–52.0)
Hemoglobin: 8.8 g/dL — ABNORMAL LOW (ref 13.0–17.0)
LYMPHS ABS: 2.1 10*3/uL (ref 0.7–4.0)
Lymphocytes Relative: 19 %
MCH: 21.1 pg — ABNORMAL LOW (ref 26.0–34.0)
MCHC: 29.3 g/dL — AB (ref 30.0–36.0)
MCV: 71.9 fL — AB (ref 78.0–100.0)
MONO ABS: 0.9 10*3/uL (ref 0.1–1.0)
Monocytes Relative: 8 %
NEUTROS ABS: 7.8 10*3/uL — AB (ref 1.7–7.7)
Neutrophils Relative %: 69 %
PLATELETS: 280 10*3/uL (ref 150–400)
RBC: 4.17 MIL/uL — AB (ref 4.22–5.81)
RDW: 24.6 % — ABNORMAL HIGH (ref 11.5–15.5)
WBC: 11.2 10*3/uL — AB (ref 4.0–10.5)

## 2017-09-05 LAB — MAGNESIUM: Magnesium: 1.5 mg/dL — ABNORMAL LOW (ref 1.7–2.4)

## 2017-09-05 LAB — GLUCOSE, CAPILLARY
Glucose-Capillary: 206 mg/dL — ABNORMAL HIGH (ref 65–99)
Glucose-Capillary: 188 mg/dL — ABNORMAL HIGH (ref 65–99)
Glucose-Capillary: 167 mg/dL — ABNORMAL HIGH (ref 65–99)
Glucose-Capillary: 152 mg/dL — ABNORMAL HIGH (ref 65–99)

## 2017-09-05 MED ORDER — MORPHINE SULFATE (PF) 2 MG/ML IV SOLN
2.0000 mg | INTRAVENOUS | Status: AC | PRN
  Administered 2017-09-05 (×2): 2 mg via INTRAVENOUS
  Filled 2017-09-05 (×2): qty 1

## 2017-09-05 MED ORDER — POTASSIUM PHOSPHATES 15 MMOLE/5ML IV SOLN
30.0000 meq | Freq: Once | INTRAVENOUS | Status: AC
  Administered 2017-09-05: 30 meq via INTRAVENOUS
  Filled 2017-09-05: qty 6.82

## 2017-09-05 MED ORDER — POTASSIUM CHLORIDE 10 MEQ/100ML IV SOLN
10.0000 meq | INTRAVENOUS | Status: AC
  Administered 2017-09-05 (×6): 10 meq via INTRAVENOUS
  Filled 2017-09-05: qty 100

## 2017-09-05 MED ORDER — VANCOMYCIN HCL IN DEXTROSE 750-5 MG/150ML-% IV SOLN
750.0000 mg | Freq: Two times a day (BID) | INTRAVENOUS | Status: DC
  Administered 2017-09-06 – 2017-09-08 (×6): 750 mg via INTRAVENOUS
  Filled 2017-09-05 (×7): qty 150

## 2017-09-05 MED ORDER — VANCOMYCIN HCL 10 G IV SOLR
2000.0000 mg | Freq: Once | INTRAVENOUS | Status: DC
  Filled 2017-09-05: qty 2000

## 2017-09-05 MED ORDER — POTASSIUM CHLORIDE CRYS ER 20 MEQ PO TBCR
40.0000 meq | EXTENDED_RELEASE_TABLET | Freq: Two times a day (BID) | ORAL | Status: DC
  Administered 2017-09-05: 40 meq via ORAL
  Filled 2017-09-05: qty 2

## 2017-09-05 MED ORDER — POTASSIUM CL IN DEXTROSE 5% 20 MEQ/L IV SOLN
20.0000 meq | INTRAVENOUS | Status: DC
  Filled 2017-09-05: qty 1000

## 2017-09-05 MED ORDER — POTASSIUM CHLORIDE 2 MEQ/ML IV SOLN
INTRAVENOUS | Status: DC
  Administered 2017-09-05: 10:00:00 via INTRAVENOUS
  Filled 2017-09-05 (×3): qty 1000

## 2017-09-05 MED ORDER — MAGNESIUM SULFATE 4 GM/100ML IV SOLN
4.0000 g | Freq: Once | INTRAVENOUS | Status: AC
  Administered 2017-09-05: 4 g via INTRAVENOUS
  Filled 2017-09-05: qty 100

## 2017-09-05 MED ORDER — OXYCODONE-ACETAMINOPHEN 5-325 MG PO TABS
1.0000 | ORAL_TABLET | ORAL | Status: DC | PRN
  Administered 2017-09-05: 1 via ORAL
  Filled 2017-09-05: qty 1

## 2017-09-05 MED ORDER — OXYCODONE-ACETAMINOPHEN 5-325 MG PO TABS
2.0000 | ORAL_TABLET | Freq: Once | ORAL | Status: AC
  Administered 2017-09-05: 2 via ORAL
  Filled 2017-09-05: qty 2

## 2017-09-05 MED ORDER — POTASSIUM CHLORIDE 10 MEQ/100ML IV SOLN
10.0000 meq | INTRAVENOUS | Status: DC
  Filled 2017-09-05 (×7): qty 100

## 2017-09-05 NOTE — Progress Notes (Addendum)
Pharmacy Antibiotic Note  [name] is a 67 y.o. male admitted on 09/01/2017 with acute encephalopathy and sepsis, currently on meropenem for ESBL e.coli bacteremia. New set of blood culture done on 2/24 grew GPC in both tubes. Pharmacy has been consulted for vancomycin dosing. Scr 1.48, est. crcl ~ 65 ml/min.   Plan: Vancomycin loading dose 2000 mg Vancomycin 750 mg Q 12 hrs F/u culture sensitivity and monitor renal function Vancomycin trough at steady state  Height: 6\' 3"  (190.5 cm) Weight: 232 lb 5.8 oz (105.4 kg) IBW/kg (Calculated) : 84.5  Temp (24hrs), Avg:97.9 F (36.6 C), Min:97.6 F (36.4 C), Max:98.6 F (37 C)  Recent Labs  Lab 09/01/17 0251 09/01/17 0432 09/01/17 0529 09/02/17 0752 09/02/17 1138 09/02/17 1452 09/03/17 0239 09/03/17 1627 09/04/17 0210 09/05/17 0158 09/05/17 1553  WBC  --   --   --  15.5*  --  12.0* 10.4  --  10.9* 11.2*  --   CREATININE  --   --   --  1.91*  --  1.81* 1.65* 1.53* 1.41* 1.49* 1.48*  LATICACIDVEN 5.94* 3.09* 3.2*  --  1.8  --  1.4  --   --   --   --     Estimated Creatinine Clearance: 65.4 mL/min (A) (by C-G formula based on SCr of 1.48 mg/dL (H)).    No Known Allergies  Antimicrobials this admission: Vancomycin 2/25 >> Meropenem 2/23 >> Ceftriaxone 2/21>>2/23 Zosyn x 1 on 2/21   Dose adjustments this admission:   Microbiology results: 2/24 blood x 2 - 2/2 CPC  2/24: CDif neg  2/21 blood x 2 - ESBL-E. coli  2/21 urine - > 100 K/ml ESBL-E.coli  2/21 MRSA PCR positive  2/21 Influeza PCR negative  Thank you for allowing pharmacy to be a part of this patient's care.  3/21, PharmD, BCPS  Clinical Pharmacist  Pager: [phone]  09/05/2017 6:24 PM   Addendum: Received BCID call earlier this evening Enterobacteriaceae species was detected, but gram stain grew gram positive cocci, which was confirmed with lab. Asked lab to re-run BCID with the other set of blood culture, and this was back with nothing detected.  It is hard to interpreted the BCID results. It is reasonable to start vancomycin for now. Dr. 09/07/2017 was been notified.   Sharolyn Douglas, PharmD, BCPS  Clinical Pharmacist  Pager: [phone]

## 2017-09-05 NOTE — Consult Note (Signed)
WOC consult requested for chronic stage 4  sacral pressure injury.  This was already performed on 2/21; please refer to previous progress notes for assessment and measurements; and topical treatment orders have been provided for the bedside nurses to perform. Please re-consult if further assistance is needed.  Thank-you,  Cammie Mcgee MSN, RN, CWOCN, [place], CNS [phone]

## 2017-09-05 NOTE — Progress Notes (Signed)
CRITICAL VALUE ALERT  Critical value received: K+2.1  Date of notification: 09/05/17  Time of notification:  0540  Critical value read back:Yes.    Nurse who received alert:  Leanna Battles RN  MD notified (1st page):  [phone]  Time of first page:  0545  MD notified (2nd page):  Time of second page:  Responding MD:  Bertram Denver.  Time MD responded: [phone]

## 2017-09-05 NOTE — Progress Notes (Signed)
PROGRESS NOTE  [name] GGY:[id number] DOB: 01-11-1951 DOA: 09/01/2017 PCP: [name], MD (Inactive)  HPI/Recap of past 30 hours: 67 year old male with past medical history of atrial fibrillation on xarelto, DM, diastolic heart failure, OSA, stage III CKD and status post right AKA admitted on the early morning of 2/21 from his nursing facility with fever and acute encephalopathy. Patient has had previous hospitalizations for acute urinary retention leading to UTI and sepsis. In the emergency room, patient found to be in sepsis secondary to UTI again, Afib with RVR. Pt was started on diltiazem ggt and admitted in the ICU for further management.  Today, pt was noted to be more awake, with improved WOB. Yells intermittently due to non-focal pain. Denies any chest pain, abdominal pain when asked. No fever/chills noted. Pt still with diarrhea, flexiseal draining liquid urine. C.diff neg, FOBT neg   Assessment/Plan: Principal Problem:   UTI (urinary tract infection) Active Problems:   Hyperlipidemia   HYPOKALEMIA   Obesity, Class III, BMI 40-49.9 (morbid obesity) (HCC)   ANEMIA   CAD S/P percutaneous coronary angioplasty   Long term (current) use of anticoagulants   Chronic diastolic congestive heart failure (HCC)   CKD (chronic kidney disease) stage 3, GFR 30-59 ml/min (HCC)   Atrial fibrillation with RVR (HCC)   Sepsis, unspecified organism (HCC)   Pressure ulcer   Acute metabolic encephalopathy   S/P AKA (above knee amputation) unilateral, right (HCC)   Sepsis secondary to ESBL bacteremia Last temp spike on 2/22, improving leukocytosis, BP still soft Lactic acidosis resolved, procalcitonin 1.56 BC X 2 grew ESBL, repeat BC growing gram + cocci Spoke to ID, plan to treat for about 2 weeks or less, pending on clinical course Continue meropenem, will start Vancomycin empirically , pharmacy to dose Monitor closely  UTI due to ESBL Recurrent, hx of urinary retension UC  grew ESBL Foley in, due to retension May need urology work up after bacteremia clears Continue Meropenem  Severe electrolyte imbalance Hypokalemia/hypocalcemia/Hypophosphatemia/Hypomagnesemia/hypernatremia likely ??Refeeding syndrome Replace electrolytes prn aggressively  Started gentle hydration with D5W, monitor closely, prn lasix due to concern for overload Will consult nutrition Once more stable, will encourage oral intake  Acute metabolic encephalopathy  Likely due to above CT head on admission negative Management as above  Acute on Chronic diastolic heart failure Appears overloaded, s/p IV hydration for sepsis BNP 1,401 elevated from baseline CXR: CHF with pulm edema IV lasix with close monitoring of BP and renal fxn Strict I&O, daily weight  Acute kidney injury in the setting of CKD stage 3 Baseline Cr ~ 1.4 Improving Likely due to above Avoid nephrotoxics  Chronic Atrial fibrillation with RVR In Afib with HR ~110 Likely due to above, sepsis Continue Cardizem gtt, plan to wean off Chads 2 score of 5, continue xarelto Held home diltiazem, lopressor  Microcytic anemia Baseline hgb ~ 8 Iron panel: Fe 33, TIBC 113, sats 29, Ferritin 1,237 FOBT negative Monitor closely as pt on AC  Diarrhea Multiple episode, liquid stool, flexi-seal placed C.diff negative  DM type 2 A1c 9.6 on 10/18 CBG stable SSI, detemir, held home insulin regimen  CAD S/P percutaneous coronary angioplasty  Stable  Sacral/decubitus ulcer Wound care consult    Morbid Obesity Lifestyle modification   Code Status: Full  Family Communication: None at bedside  Disposition Plan: Back to SNF once stable   Consultants:  None  Procedures:  None  Antimicrobials:  IV Meropenem  Start IV Vancomycin  S/P IV ceftriaxone  DVT prophylaxis:  Xarelto   Objective: Vitals:   09/05/17 0415 09/05/17 0823 09/05/17 1143 09/05/17 1644  BP: (!) 133/59 101/62 (!) 159/96 (!) 116/94   Pulse: (!) 102 88 89 (!) 101  Resp: (!) 24 20 (!) 21 (!) 24  Temp: 98 F (36.7 C) 97.7 F (36.5 C) 97.8 F (36.6 C) 97.8 F (36.6 C)  TempSrc: Oral Oral Oral Oral  SpO2: 96% 98% 96% 100%  Weight: 105.4 kg (232 lb 5.8 oz)     Height:        Intake/Output Summary (Last 24 hours) at 09/05/2017 1730 Last data filed at 09/05/2017 1400 Gross per 24 hour  Intake 30 ml  Output 8700 ml  Net -8670 ml   Filed Weights   09/03/17 0500 09/04/17 0317 09/05/17 0415  Weight: 127.5 kg (281 lb 1.4 oz) 127 kg (280 lb) 105.4 kg (232 lb 5.8 oz)    Exam:   General: Mild distress, restless  Cardiovascular: S1, S2, irregular  Respiratory: Poor inspiratory effort, CTA   Abdomen: Soft, NT, obese, BS+   Musculoskeletal: R AKA, 1+ pitting edema on LLE  Skin: Chronic venous stasis on LLE, stage IV sacral/decubitus ulcer  Psychiatry: Confused   Data Reviewed: CBC: Recent Labs  Lab 09/01/17 0237 09/02/17 0752 09/02/17 1452 09/03/17 0239 09/04/17 0210 09/05/17 0158  WBC 35.5* 15.5* 12.0* 10.4 10.9* 11.2*  NEUTROABS 31.6*  --   --   --  7.7 7.8*  HGB 9.7* 8.3* 8.1* 8.3* 7.5* 8.8*  HCT 32.9* 29.0* 28.0* 29.0* 26.2* 30.0*  MCV 73.4* 72.3* 73.1* 73.4* 72.6* 71.9*  PLT 505* 352 341 294 263 280   Basic Metabolic Panel: Recent Labs  Lab 09/02/17 0752  09/03/17 0239 09/03/17 1627 09/04/17 0210 09/04/17 1400 09/05/17 0158 09/05/17 1553  NA 154*   < > 150* 146* 142  --  151* 149*  K 3.0*   < > 2.3* 3.2* 3.0*  --  2.1* 2.3*  CL 119*   < > 115* 106 110  --  112* 109  CO2 19*   < > 22 21* 22  --  25 26  GLUCOSE 212*   < > 169* 169* 345*  --  195* 173*  BUN 16   < > 17 15 12   --  10 7  CREATININE 1.91*   < > 1.65* 1.53* 1.41*  --  1.49* 1.48*  CALCIUM 7.2*   < > 6.6* 6.1* 6.3*  --  7.3* 7.4*  MG 1.3*  --  1.6*  --  1.8  --   --  1.5*  PHOS  --   --   --   --   --  <1.0*  --  1.2*   < > = values in this interval not displayed.   GFR: Estimated Creatinine Clearance: 65.4 mL/min (A)  (by C-G formula based on SCr of 1.48 mg/dL (H)). Liver Function Tests: Recent Labs  Lab 09/01/17 0237  AST 38  ALT 12*  ALKPHOS 132*  BILITOT 1.6*  PROT 7.7  ALBUMIN 2.0*   No results for input(s): LIPASE, AMYLASE in the last 168 hours. No results for input(s): AMMONIA in the last 168 hours. Coagulation Profile: Recent Labs  Lab 09/01/17 0237  INR 1.49   Cardiac Enzymes: No results for input(s): CKTOTAL, CKMB, CKMBINDEX, TROPONINI in the last 168 hours. BNP (last 3 results) No results for input(s): PROBNP in the last 8760 hours. HbA1C: No results for input(s): HGBA1C in the last 72 hours.  CBG: Recent Labs  Lab 09/04/17 1626 09/04/17 2110 09/05/17 0823 09/05/17 1146 09/05/17 1648  GLUCAP 137* 119* 206* 188* 167*   Lipid Profile: No results for input(s): CHOL, HDL, LDLCALC, TRIG, CHOLHDL, LDLDIRECT in the last 72 hours. Thyroid Function Tests: No results for input(s): TSH, T4TOTAL, FREET4, T3FREE, THYROIDAB in the last 72 hours. Anemia Panel: Recent Labs    09/03/17 1627  FERRITIN 1,237*  TIBC 113*  IRON 33*   Urine analysis:    Component Value Date/Time   COLORURINE AMBER (A) 09/01/2017 0237   APPEARANCEUR CLOUDY (A) 09/01/2017 0237   LABSPEC 1.006 09/01/2017 0237   PHURINE 7.0 09/01/2017 0237   GLUCOSEU NEGATIVE 09/01/2017 0237   HGBUR MODERATE (A) 09/01/2017 0237   BILIRUBINUR NEGATIVE 09/01/2017 0237   KETONESUR NEGATIVE 09/01/2017 0237   PROTEINUR 100 (A) 09/01/2017 0237   UROBILINOGEN 1.0 11/27/2014 0755   NITRITE POSITIVE (A) 09/01/2017 0237   LEUKOCYTESUR LARGE (A) 09/01/2017 0237   Sepsis Labs: @LABRCNTIP (procalcitonin:4,lacticidven:4)  ) Recent Results (from the past 240 hour(s))  Culture, blood (Routine x 2)     Status: Abnormal   Collection Time: 09/01/17  2:37 AM  Result Value Ref Range Status   Specimen Description BLOOD SITE NOT SPECIFIED  Final   Special Requests BOTTLES DRAWN AEROBIC AND ANAEROBIC  Final   Culture  Setup Time    Final    GRAM NEGATIVE RODS IN BOTH AEROBIC AND ANAEROBIC BOTTLES CRITICAL RESULT CALLED TO, READ BACK BY AND VERIFIED WITH: L BAJBUS PHARMD 2230 09/01/17 A BROWNING    Culture (A)  Final    ESCHERICHIA COLI SUSCEPTIBILITIES PERFORMED ON PREVIOUS CULTURE WITHIN THE LAST 5 DAYS. Performed at [facility], [address], [place], [place] [address]    Report Status 09/03/2017 FINAL  Final  Urine culture     Status: Abnormal   Collection Time: 09/01/17  2:37 AM  Result Value Ref Range Status   Specimen Description URINE, CATHETERIZED  Final   Special Requests NONE  Final   Culture (A)  Final    >=100,000 COLONIES/mL ESCHERICHIA COLI Confirmed Extended Spectrum Beta-Lactamase Producer (ESBL).  In bloodstream infections from ESBL organisms, carbapenems are preferred over piperacillin/tazobactam. They are shown to have a lower risk of mortality. Performed at [facility], [address], [place], [place] [address]    Report Status 09/03/2017 FINAL  Final   Organism ID, Bacteria ESCHERICHIA COLI (A)  Final      Susceptibility   Escherichia coli - MIC*    AMPICILLIN >=32 RESISTANT Resistant     CEFAZOLIN >=64 RESISTANT Resistant     CEFTRIAXONE >=64 RESISTANT Resistant     CIPROFLOXACIN >=4 RESISTANT Resistant     GENTAMICIN >=16 RESISTANT Resistant     IMIPENEM <=0.25 SENSITIVE Sensitive     NITROFURANTOIN <=16 SENSITIVE Sensitive     TRIMETH/SULFA <=20 SENSITIVE Sensitive     AMPICILLIN/SULBACTAM >=32 RESISTANT Resistant     PIP/TAZO 8 SENSITIVE Sensitive     Extended ESBL POSITIVE Resistant     * >=100,000 COLONIES/mL ESCHERICHIA COLI  Culture, blood (Routine x 2)     Status: Abnormal   Collection Time: 09/01/17  2:42 AM  Result Value Ref Range Status   Specimen Description BLOOD SITE NOT SPECIFIED  Final   Special Requests   Final    Blood Culture adequate volume BOTTLES DRAWN AEROBIC AND ANAEROBIC   Culture  Setup Time   Final    GRAM NEGATIVE RODS IN BOTH AEROBIC  AND ANAEROBIC BOTTLES CRITICAL RESULT CALLED TO, READ BACK BY AND VERIFIED WITH: L BAJBUS PHARMD 2230 09/01/17 A BROWNING    Culture (A)  Final    ESCHERICHIA COLI Confirmed Extended Spectrum Beta-Lactamase Producer (ESBL).  In bloodstream infections from ESBL organisms, carbapenems are preferred over piperacillin/tazobactam. They are shown to have a lower risk of mortality. Performed at [facility], [address] [place], [place] [address]    Report Status 09/03/2017 FINAL  Final   Organism ID, Bacteria ESCHERICHIA COLI  Final      Susceptibility   Escherichia coli - MIC*    AMPICILLIN >=32 RESISTANT Resistant     CEFAZOLIN >=64 RESISTANT Resistant     CEFEPIME RESISTANT Resistant     CEFTAZIDIME RESISTANT Resistant     CEFTRIAXONE >=64 RESISTANT Resistant     CIPROFLOXACIN >=4 RESISTANT Resistant     GENTAMICIN >=16 RESISTANT Resistant     IMIPENEM <=0.25 SENSITIVE Sensitive     TRIMETH/SULFA <=20 SENSITIVE Sensitive     AMPICILLIN/SULBACTAM >=32 RESISTANT Resistant     PIP/TAZO 8 SENSITIVE Sensitive     Extended ESBL POSITIVE Resistant     * ESCHERICHIA COLI  Blood Culture ID Panel (Reflexed)     Status: Abnormal   Collection Time: 09/01/17  2:42 AM  Result Value Ref Range Status   Enterococcus species NOT DETECTED NOT DETECTED Final   Listeria monocytogenes NOT DETECTED NOT DETECTED Final   Staphylococcus species NOT DETECTED NOT DETECTED Final   Staphylococcus aureus NOT DETECTED NOT DETECTED Final   Streptococcus species NOT DETECTED NOT DETECTED Final   Streptococcus agalactiae NOT DETECTED NOT DETECTED Final   Streptococcus pneumoniae NOT DETECTED NOT DETECTED Final   Streptococcus pyogenes NOT DETECTED NOT DETECTED Final   Acinetobacter baumannii NOT DETECTED NOT DETECTED Final   Enterobacteriaceae species DETECTED (A) NOT DETECTED Final    Comment: Enterobacteriaceae represent a large family of gram-negative bacteria, not a single organism. CRITICAL RESULT  CALLED TO, READ BACK BY AND VERIFIED WITH: L BAJBUS PHARMD 2230 09/01/17 A BROWNING    Enterobacter cloacae complex NOT DETECTED NOT DETECTED Final   Escherichia coli DETECTED (A) NOT DETECTED Final    Comment: CRITICAL RESULT CALLED TO, READ BACK BY AND VERIFIED WITH: L BAJBUS PHARMD 2230 09/01/17 A BROWNING    Klebsiella oxytoca NOT DETECTED NOT DETECTED Final   Klebsiella pneumoniae NOT DETECTED NOT DETECTED Final   Proteus species NOT DETECTED NOT DETECTED Final   Serratia marcescens NOT DETECTED NOT DETECTED Final   Carbapenem resistance NOT DETECTED NOT DETECTED Final   Haemophilus influenzae NOT DETECTED NOT DETECTED Final   Neisseria meningitidis NOT DETECTED NOT DETECTED Final   Pseudomonas aeruginosa NOT DETECTED NOT DETECTED Final   Candida albicans NOT DETECTED NOT DETECTED Final   Candida glabrata NOT DETECTED NOT DETECTED Final   Candida krusei NOT DETECTED NOT DETECTED Final   Candida parapsilosis NOT DETECTED NOT DETECTED Final   Candida tropicalis NOT DETECTED NOT DETECTED Final    Comment: Performed at [facility], [address] [place], [place] [address]  MRSA PCR Screening     Status: Abnormal   Collection Time: 09/01/17  9:48 PM  Result Value Ref Range Status   MRSA by PCR POSITIVE (A) NEGATIVE Final    Comment:        The GeneXpert MRSA Assay (FDA approved for NASAL specimens only), is one component of a comprehensive MRSA colonization surveillance program. It is not intended to diagnose MRSA  infection nor to guide or monitor treatment for MRSA infections. RESULT CALLED TO, READ BACK BY AND VERIFIED WITH: J.MILLER,RN AT 0041 09/02/17 L.PITT   C difficile quick scan w PCR reflex     Status: None   Collection Time: 09/04/17 10:00 AM  Result Value Ref Range Status   C Diff antigen NEGATIVE NEGATIVE Final   C Diff toxin NEGATIVE NEGATIVE Final   C Diff interpretation No C. difficile detected.  Final    Comment: Performed at [facility],  [address], [place], [place] [address]  Culture, blood (routine x 2)     Status: None (Preliminary result)   Collection Time: 09/04/17  2:10 PM  Result Value Ref Range Status   Specimen Description BLOOD RIGHT HAND  Final   Special Requests IN PEDIATRIC BOTTLE Blood Culture adequate volume  Final   Culture  Setup Time   Final    IN PEDIATRIC BOTTLE GRAM POSITIVE COCCI CRITICAL RESULT CALLED TO, READ BACK BY AND VERIFIED WITH: Oneta Rack AT 1652 09/05/17 BY L BENFIELD Performed at [facility], [address] [place], [place] [address]    Culture GRAM POSITIVE COCCI  Final   Report Status PENDING  Incomplete  Blood Culture ID Panel (Reflexed)     Status: Abnormal   Collection Time: 09/04/17  2:10 PM  Result Value Ref Range Status   Enterococcus species NOT DETECTED NOT DETECTED Final   Listeria monocytogenes NOT DETECTED NOT DETECTED Final   Staphylococcus species NOT DETECTED NOT DETECTED Final   Staphylococcus aureus NOT DETECTED NOT DETECTED Final   Streptococcus species NOT DETECTED NOT DETECTED Final   Streptococcus agalactiae NOT DETECTED NOT DETECTED Final   Streptococcus pneumoniae NOT DETECTED NOT DETECTED Final   Streptococcus pyogenes NOT DETECTED NOT DETECTED Final   Acinetobacter baumannii NOT DETECTED NOT DETECTED Final   Enterobacteriaceae species DETECTED (A) NOT DETECTED Final    Comment: Enterobacteriaceae represent a large family of gram negative bacteria, not a single organism. Refer to culture for further identification. CRITICAL RESULT CALLED TO, READ BACK BY AND VERIFIED WITH: M BELL,PHARMD AT 1652 09/05/17 BY L BENFIELD    Enterobacter cloacae complex NOT DETECTED NOT DETECTED Final   Escherichia coli NOT DETECTED NOT DETECTED Final   Klebsiella oxytoca NOT DETECTED NOT DETECTED Final   Klebsiella pneumoniae NOT DETECTED NOT DETECTED Final   Proteus species NOT DETECTED NOT DETECTED Final   Serratia marcescens NOT DETECTED NOT DETECTED Final    Carbapenem resistance NOT DETECTED NOT DETECTED Final   Haemophilus influenzae NOT DETECTED NOT DETECTED Final   Neisseria meningitidis NOT DETECTED NOT DETECTED Final   Pseudomonas aeruginosa NOT DETECTED NOT DETECTED Final   Candida albicans NOT DETECTED NOT DETECTED Final   Candida glabrata NOT DETECTED NOT DETECTED Final   Candida krusei NOT DETECTED NOT DETECTED Final   Candida parapsilosis NOT DETECTED NOT DETECTED Final   Candida tropicalis NOT DETECTED NOT DETECTED Final    Comment: Performed at [facility], [address], [place], [place] [address]  Culture, blood (routine x 2)     Status: None (Preliminary result)   Collection Time: 09/04/17  2:14 PM  Result Value Ref Range Status   Specimen Description BLOOD RIGHT THUMB  Final   Special Requests IN PEDIATRIC BOTTLE Blood Culture adequate volume  Final   Culture  Setup Time   Final    GRAM POSITIVE COCCI IN PEDIATRIC BOTTLE CRITICAL VALUE NOTED.  VALUE IS CONSISTENT WITH  PREVIOUSLY REPORTED AND CALLED VALUE. Performed at [facility], [address], [place], [place] [address]    Culture [facility] POSITIVE COCCI  Final   Report Status PENDING  Incomplete      Studies: No results found.  Scheduled Meds: . calcium carbonate  1 tablet Oral TID WC  . Chlorhexidine Gluconate Cloth  6 each Topical Q0600  . insulin aspart  0-9 Units Subcutaneous TID WC  . insulin detemir  5 Units Subcutaneous BID  . mupirocin ointment  1 application Nasal BID  . rivaroxaban  20 mg Oral Daily    Continuous Infusions: . dextrose 5 % with kcl 100 mL/hr at 09/05/17 1017  . diltiazem (CARDIZEM) infusion 10 mg/hr (09/04/17 2235)  . magnesium sulfate 1 - 4 g bolus IVPB    . meropenem (MERREM) IV Stopped (09/05/17 1351)  . potassium chloride       LOS: 4 days     Briant Cedar, MD [name]   If 7PM-7AM, please contact night-coverage [email or web address] Password [facility] 09/05/2017, 5:30 PM

## 2017-09-05 NOTE — Progress Notes (Signed)
Patient continues to c/o of uncontrolled pain to back, bottom, and knee which leads into agitation. Patient told me he take Oxycodone 20 mg for pain and requesting to take it. I did notified PA for IV pain med for better control and 2 mg morphine given but no relief. I made second call PA to update her of uncontrolled pain ask if we need to start oxycodone back and she will have day shift to evaluate. I will continue to monitor.

## 2017-09-06 ENCOUNTER — Inpatient Hospital Stay (HOSPITAL_COMMUNITY): Payer: Medicare Other

## 2017-09-06 LAB — CBC WITH DIFFERENTIAL/PLATELET
BASOS ABS: 0.1 10*3/uL (ref 0.0–0.1)
BASOS PCT: 1 %
EOS ABS: 0.8 10*3/uL — AB (ref 0.0–0.7)
Eosinophils Relative: 7 %
HCT: 27.9 % — ABNORMAL LOW (ref 39.0–52.0)
HEMOGLOBIN: 8.2 g/dL — AB (ref 13.0–17.0)
LYMPHS ABS: 3 10*3/uL (ref 0.7–4.0)
Lymphocytes Relative: 28 %
MCH: 20.9 pg — AB (ref 26.0–34.0)
MCHC: 29.4 g/dL — AB (ref 30.0–36.0)
MCV: 71 fL — ABNORMAL LOW (ref 78.0–100.0)
MONO ABS: 0.9 10*3/uL (ref 0.1–1.0)
Monocytes Relative: 8 %
Neutro Abs: 5.9 10*3/uL (ref 1.7–7.7)
Neutrophils Relative %: 56 %
PLATELETS: 286 10*3/uL (ref 150–400)
RBC: 3.93 MIL/uL — ABNORMAL LOW (ref 4.22–5.81)
RDW: 24.2 % — ABNORMAL HIGH (ref 11.5–15.5)
WBC: 10.7 10*3/uL — ABNORMAL HIGH (ref 4.0–10.5)

## 2017-09-06 LAB — BASIC METABOLIC PANEL
Anion gap: 13 (ref 5–15)
Anion gap: 12 (ref 5–15)
Anion gap: 12 (ref 5–15)
BUN: 6 mg/dL (ref 6–20)
BUN: 6 mg/dL (ref 6–20)
BUN: 5 mg/dL — AB (ref 6–20)
CALCIUM: 7.8 mg/dL — AB (ref 8.9–10.3)
CALCIUM: 7.7 mg/dL — AB (ref 8.9–10.3)
CHLORIDE: 107 mmol/L (ref 101–111)
CO2: 28 mmol/L (ref 22–32)
CO2: 29 mmol/L (ref 22–32)
CO2: 28 mmol/L (ref 22–32)
CREATININE: 1.49 mg/dL — AB (ref 0.61–1.24)
CREATININE: 1.3 mg/dL — AB (ref 0.61–1.24)
Calcium: 7.7 mg/dL — ABNORMAL LOW (ref 8.9–10.3)
Chloride: 109 mmol/L (ref 101–111)
Chloride: 107 mmol/L (ref 101–111)
Creatinine, Ser: 1.4 mg/dL — ABNORMAL HIGH (ref 0.61–1.24)
GFR calc Af Amer: 59 mL/min — ABNORMAL LOW (ref >60)
GFR calc Af Amer: >60 mL/min (ref >60)
GFR calc non Af Amer: 48 mL/min — ABNORMAL LOW (ref >60)
GFR calc non Af Amer: 51 mL/min — ABNORMAL LOW (ref >60)
GFR, EST AFRICAN AMERICAN: 55 mL/min — AB (ref >60)
GFR, EST NON AFRICAN AMERICAN: 56 mL/min — AB (ref >60)
GLUCOSE: 171 mg/dL — AB (ref 65–99)
GLUCOSE: 181 mg/dL — AB (ref 65–99)
Glucose, Bld: 233 mg/dL — ABNORMAL HIGH (ref 65–99)
Potassium: 2.4 mmol/L — CL (ref 3.5–5.1)
Potassium: 3 mmol/L — ABNORMAL LOW (ref 3.5–5.1)
Potassium: 3.1 mmol/L — ABNORMAL LOW (ref 3.5–5.1)
SODIUM: 148 mmol/L — AB (ref 135–145)
Sodium: 150 mmol/L — ABNORMAL HIGH (ref 135–145)
Sodium: 147 mmol/L — ABNORMAL HIGH (ref 135–145)

## 2017-09-06 LAB — MAGNESIUM
MAGNESIUM: 1.7 mg/dL (ref 1.7–2.4)
Magnesium: 1.9 mg/dL (ref 1.7–2.4)
Magnesium: 1.6 mg/dL — ABNORMAL LOW (ref 1.7–2.4)

## 2017-09-06 LAB — PREALBUMIN: Prealbumin: <5 mg/dL — ABNORMAL LOW (ref 18–38)

## 2017-09-06 LAB — CK: CK TOTAL: 28 U/L — AB (ref 49–397)

## 2017-09-06 LAB — PHOSPHORUS
PHOSPHORUS: 1.3 mg/dL — AB (ref 2.5–4.6)
Phosphorus: 2.2 mg/dL — ABNORMAL LOW (ref 2.5–4.6)
Phosphorus: 1.7 mg/dL — ABNORMAL LOW (ref 2.5–4.6)

## 2017-09-06 LAB — GLUCOSE, CAPILLARY
GLUCOSE-CAPILLARY: 217 mg/dL — AB (ref 65–99)
Glucose-Capillary: 149 mg/dL — ABNORMAL HIGH (ref 65–99)
Glucose-Capillary: 164 mg/dL — ABNORMAL HIGH (ref 65–99)
Glucose-Capillary: 173 mg/dL — ABNORMAL HIGH (ref 65–99)

## 2017-09-06 MED ORDER — DILTIAZEM HCL ER 60 MG PO CP12
120.0000 mg | ORAL_CAPSULE | Freq: Two times a day (BID) | ORAL | Status: DC
  Administered 2017-09-06 – 2017-09-10 (×9): 120 mg via ORAL
  Filled 2017-09-06 (×11): qty 2

## 2017-09-06 MED ORDER — POTASSIUM PHOSPHATES 15 MMOLE/5ML IV SOLN
30.0000 mmol | Freq: Once | INTRAVENOUS | Status: AC
  Administered 2017-09-06: 30 mmol via INTRAVENOUS
  Filled 2017-09-06: qty 10

## 2017-09-06 MED ORDER — ADULT MULTIVITAMIN W/MINERALS CH
1.0000 | ORAL_TABLET | Freq: Every day | ORAL | Status: DC
  Administered 2017-09-06 – 2017-09-13 (×6): 1 via ORAL
  Filled 2017-09-06 (×6): qty 1

## 2017-09-06 MED ORDER — POTASSIUM CHLORIDE 10 MEQ/100ML IV SOLN
10.0000 meq | INTRAVENOUS | Status: AC
  Administered 2017-09-06 (×4): 10 meq via INTRAVENOUS
  Filled 2017-09-06 (×4): qty 100

## 2017-09-06 MED ORDER — POTASSIUM CHLORIDE 10 MEQ/100ML IV SOLN
10.0000 meq | INTRAVENOUS | Status: AC
  Administered 2017-09-06 (×4): 10 meq via INTRAVENOUS
  Filled 2017-09-06 (×3): qty 100

## 2017-09-06 MED ORDER — MAGNESIUM SULFATE 2 GM/50ML IV SOLN
2.0000 g | Freq: Once | INTRAVENOUS | Status: AC
  Administered 2017-09-06: 2 g via INTRAVENOUS
  Filled 2017-09-06: qty 50

## 2017-09-06 MED ORDER — PRO-STAT SUGAR FREE PO LIQD
30.0000 mL | Freq: Three times a day (TID) | ORAL | Status: DC
  Administered 2017-09-06 – 2017-09-13 (×17): 30 mL via ORAL
  Filled 2017-09-06 (×15): qty 30

## 2017-09-06 MED ORDER — POTASSIUM CHLORIDE CRYS ER 20 MEQ PO TBCR
40.0000 meq | EXTENDED_RELEASE_TABLET | Freq: Once | ORAL | Status: AC
  Administered 2017-09-06: 40 meq via ORAL
  Filled 2017-09-06: qty 2

## 2017-09-06 MED ORDER — METOPROLOL SUCCINATE ER 50 MG PO TB24
50.0000 mg | ORAL_TABLET | Freq: Every day | ORAL | Status: DC
  Administered 2017-09-06 – 2017-09-07 (×2): 50 mg via ORAL
  Filled 2017-09-06 (×2): qty 1

## 2017-09-06 NOTE — Progress Notes (Signed)
Palliative Medicine consult noted. Due to high referral volume, there may be a delay seeing this patient. Please call the Palliative Medicine Team office at [phone] if recommendations are needed in the interim.  Thank you for inviting [place] to see this patient.  Margret Chance Clemma Johnsen, RN, BSN, [facility] Palliative Medicine Team 09/06/2017 10:14 AM Office [phone]

## 2017-09-06 NOTE — Progress Notes (Signed)
Initial Nutrition Assessment  DOCUMENTATION CODES:   Obesity unspecified  INTERVENTION:   -Consider SLP evaluation for safest diet  -30 ml Prostat TID, each supplement provides 100 kcals and 15 grams protein -MVI daily -If poor oral intake continues and pt/family continue to desire aggressive care, consider initiation of nutrition support. Recommend:  Initiate Osmolite 1.5 @ 20 ml/hr and increase by 10 ml every 8 hours to goal rate of 50 ml/hr.   60 ml Prostat BID.    Tube feeding regimen provides 2200 kcal (100% of needs), 135 grams of protein, and 914 ml of H2O.   NUTRITION DIAGNOSIS:   Increased nutrient needs related to wound healing as evidenced by estimated needs.  GOAL:   Patient will meet greater than or equal to 90% of their needs  MONITOR:   PO intake, Supplement acceptance, Diet advancement, Labs, Weight trends, Skin  REASON FOR ASSESSMENT:   Consult Assessment of nutrition requirement/status  ASSESSMENT:   [name] is a 67 y.o. male with medical history significant of hypertension, hyperlipidemia, diabetes mellitus, atrial fibrillation on Xarelto, OSA not on CPAP, GI bleeding, dCHF, COPD, CKD-3, s/p of right AKA, who presents with fever and altered mental status.  Pt admitted with sepsis related to UTI.   Pt -5.5 L x 24 hours and -10.7 L since admission. Noted pt has experienced a 27.9% wt loss over the past year.    Reviewed CWOCN note on 09/01/17; pt with stage IV sacral wound. Pt also with rt AKA.   Pt disoriented and unable to provide further hx. Per H&P, noted pt received 30 ml Prostat TID and Glucerna shake TID PTA.   Pt with poor meal completion. Noted PO 0-25%. Pt was just advanced to full liquid diet, however, due to mental status, suspect intake will continue to be poor. Lunch tray on tray table untouched.  Pt awaiting palliative care consult for goals of care.  Labs reviewed: Na: 148, K: 3.0 (on IV supplementation), Phos: 2.2, CBGS:  152-217(inpatient orders for glycemic control are 0-9 units insulin aspart TID with meals and 5 units insulin detemir BID).   NUTRITION - FOCUSED PHYSICAL EXAM:    Most Recent Value  Orbital Region  Mild depletion  Upper Arm Region  No depletion  Thoracic and Lumbar Region  No depletion  Buccal Region  No depletion  Temple Region  No depletion  Clavicle Bone Region  No depletion  Clavicle and Acromion Bone Region  No depletion  Scapular Bone Region  No depletion  Dorsal Hand  No depletion  Patellar Region  No depletion  Anterior Thigh Region  No depletion  Posterior Calf Region  No depletion  Edema (RD Assessment)  Mild  Hair  Reviewed  Eyes  Reviewed  Mouth  Reviewed  Skin  Reviewed  Nails  Reviewed       Diet Order:  Diet full liquid Room service appropriate? Yes; Fluid consistency: Thin  EDUCATION NEEDS:   Not appropriate for education at this time  Skin:  Skin Assessment: Skin Integrity Issues: Skin Integrity Issues:: Incisions, Stage IV Stage IV: sacrum Incisions: closed rt AKA incision  Last BM:  09/05/17 (via rectal tube)  Height:   Ht Readings from Last 1 Encounters:  09/01/17 6\' 3"  (1.905 m)    Weight:   Wt Readings from Last 1 Encounters:  09/06/17 249 lb 14.4 oz (113.4 kg)    Ideal Body Weight:  81.9 kg  BMI:  Body mass index is 31.24 kg/m.  Estimated  Nutritional Needs:   Kcal:  2250-2450  Protein:  120-135 grams  Fluid:  2.2-2.4 L    [name] Knife, RD, LDN, CDE Pager: [phone] After hours Pager: [phone]

## 2017-09-06 NOTE — Progress Notes (Signed)
PROGRESS NOTE  [name] ZOX:[id number] DOB: 12-19-1950 DOA: 09/01/2017 PCP: [name], MD (Inactive)  HPI/Recap of past 65 hours: 67 year old male with past medical history of atrial fibrillation on xarelto, DM, diastolic heart failure, OSA, stage III CKD and status post right AKA admitted on the early morning of 2/21 from his nursing facility with fever and acute encephalopathy. Patient has had previous hospitalizations for acute urinary retention leading to UTI and sepsis. In the emergency room, patient found to be in sepsis secondary to UTI again, Afib with RVR. Pt was started on diltiazem ggt and admitted in the ICU for further management.  Today, pt was noted to be more awake, with improved WOB. Still yelling intermittently due to non-focal pain. Pt still with diarrhea, flexiseal draining liquid brown stool. Pt still lethargic, unable to tolerate orally, although constantly demanding soda. Palliative consulted for GOC. Tried to contact sister, no answer.   Assessment/Plan: Principal Problem:   UTI (urinary tract infection) Active Problems:   Hyperlipidemia   HYPOKALEMIA   Obesity, Class III, BMI 40-49.9 (morbid obesity) (HCC)   ANEMIA   CAD S/P percutaneous coronary angioplasty   Long term (current) use of anticoagulants   Chronic diastolic congestive heart failure (HCC)   CKD (chronic kidney disease) stage 3, GFR 30-59 ml/min (HCC)   Atrial fibrillation with RVR (HCC)   Sepsis, unspecified organism (HCC)   Pressure ulcer   Acute metabolic encephalopathy   S/P AKA (above knee amputation) unilateral, right (HCC)   Sepsis secondary to ESBL bacteremia Last temp spike on 2/22, improving leukocytosis, BP still soft Lactic acidosis resolved, procalcitonin 1.56 BC X 2 grew ESBL, repeat BC growing gram + cocci, ordered another repeat Spoke to ID, plan to treat for about 2 weeks or less, pending on clinical course Continue meropenem, continue Vancomycin empirically ,  pharmacy to dose Monitor closely  UTI due to ESBL Recurrent, hx of urinary retension UC grew ESBL Foley in, due to retension May need urology work up after bacteremia clears Continue Meropenem  Severe electrolyte imbalance Hypokalemia/hypocalcemia/Hypophosphatemia/Hypomagnesemia/hypernatremia likely ??Refeeding syndrome Replace electrolytes prn aggressively  Unable to tolerate orally as pt lethargic, SLP consulted Nutrition consulted  Acute metabolic encephalopathy  Likely due to above CT head on admission negative Management as above  Acute on Chronic diastolic heart failure Appears overloaded, s/p IV hydration for sepsis BNP 1,401 elevated from baseline CXR: CHF with pulm edema IV lasix prn with close monitoring of BP and renal fxn Strict I&O, daily weight  Acute kidney injury in the setting of CKD stage 3 Baseline Cr ~ 1.4 Improving Likely due to above Avoid nephrotoxics  Chronic Atrial fibrillation with RVR In Afib with HR in the 90s, fluctuates Likely due to above, sepsis Continue Cardizem gtt, start to wean off and start PO diltiazem and metoprolol Chads 2 score of 5, continue xarelto  Microcytic anemia Baseline hgb ~ 8 Iron panel: Fe 33, TIBC 113, sats 29, Ferritin 1,237 FOBT negative Monitor closely as pt on AC  Diarrhea Multiple episode, liquid stool, flexi-seal placed C.diff negative  DM type 2 A1c 9.6 on 10/18 CBG stable SSI, detemir, held home insulin regimen  CAD S/P percutaneous coronary angioplasty  Stable  Sacral/decubitus ulcer Wound care consult    Morbid Obesity Lifestyle modification  GOC Palliative consulted    Code Status: Full  Family Communication: None at bedside  Disposition Plan: Back to SNF once stable   Consultants:  None  Procedures:  None  Antimicrobials:  IV Meropenem  Start IV Vancomycin  S/P IV ceftriaxone   DVT prophylaxis:  Xarelto   Objective: Vitals:   09/06/17 0415 09/06/17 0800  09/06/17 1100 09/06/17 1609  BP:   139/65 116/63  Pulse:   (!) 105 99  Resp:   (!) 28 (!) 28  Temp:  97.9 F (36.6 C) 97.7 F (36.5 C) 97.7 F (36.5 C)  TempSrc:  Axillary Axillary Axillary  SpO2:   96% 100%  Weight: 113.4 kg (249 lb 14.4 oz)     Height:        Intake/Output Summary (Last 24 hours) at 09/06/2017 1934 Last data filed at 09/06/2017 0800 Gross per 24 hour  Intake -  Output 3200 ml  Net -3200 ml   Filed Weights   09/04/17 0317 09/05/17 0415 09/06/17 0415  Weight: 127 kg (280 lb) 105.4 kg (232 lb 5.8 oz) 113.4 kg (249 lb 14.4 oz)    Exam:   General: Mild distress, restless  Cardiovascular: S1, S2, irregular  Respiratory: Poor inspiratory effort, CTA   Abdomen: Soft, NT, obese, BS+   Musculoskeletal: R AKA, 1+ pitting edema on LLE  Skin: Chronic venous stasis on LLE, stage IV sacral/decubitus ulcer  Psychiatry: Confused   Data Reviewed: CBC: Recent Labs  Lab 09/01/17 0237  09/02/17 1452 09/03/17 0239 09/04/17 0210 09/05/17 0158 09/06/17 0651  WBC 35.5*   < > 12.0* 10.4 10.9* 11.2* 10.7*  NEUTROABS 31.6*  --   --   --  7.7 7.8* 5.9  HGB 9.7*   < > 8.1* 8.3* 7.5* 8.8* 8.2*  HCT 32.9*   < > 28.0* 29.0* 26.2* 30.0* 27.9*  MCV 73.4*   < > 73.1* 73.4* 72.6* 71.9* 71.0*  PLT 505*   < > 341 294 263 280 286   < > = values in this interval not displayed.   Basic Metabolic Panel: Recent Labs  Lab 09/04/17 0210 09/04/17 1400 09/05/17 0158 09/05/17 1553 09/05/17 2254 09/06/17 0651 09/06/17 1717  NA 142  --  151* 149* 150* 148* 147*  K 3.0*  --  2.1* 2.3* 2.4* 3.0* 3.1*  CL 110  --  112* 109 109 107 107  CO2 22  --  25 26 28 29 28   GLUCOSE 345*  --  195* 173* 171* 233* 181*  BUN 12  --  10 7 6 6  5*  CREATININE 1.41*  --  1.49* 1.48* 1.49* 1.40* 1.30*  CALCIUM 6.3*  --  7.3* 7.4* 7.7* 7.8* 7.7*  MG 1.8  --   --  1.5* 1.9 1.7 1.6*  PHOS  --  <1.0*  --  1.2* 1.3* 2.2* 1.7*   GFR: Estimated Creatinine Clearance: 77 mL/min (A) (by C-G formula  based on SCr of 1.3 mg/dL (H)). Liver Function Tests: Recent Labs  Lab 09/01/17 0237  AST 38  ALT 12*  ALKPHOS 132*  BILITOT 1.6*  PROT 7.7  ALBUMIN 2.0*   No results for input(s): LIPASE, AMYLASE in the last 168 hours. No results for input(s): AMMONIA in the last 168 hours. Coagulation Profile: Recent Labs  Lab 09/01/17 0237  INR 1.49   Cardiac Enzymes: Recent Labs  Lab 09/05/17 2254  CKTOTAL 28*   BNP (last 3 results) No results for input(s): PROBNP in the last 8760 hours. HbA1C: No results for input(s): HGBA1C in the last 72 hours. CBG: Recent Labs  Lab 09/05/17 1648 09/05/17 2110 09/06/17 0740 09/06/17 1129 09/06/17 1828  GLUCAP 167* 152* 217* 149* 164*   Lipid Profile: No  results for input(s): CHOL, HDL, LDLCALC, TRIG, CHOLHDL, LDLDIRECT in the last 72 hours. Thyroid Function Tests: No results for input(s): TSH, T4TOTAL, FREET4, T3FREE, THYROIDAB in the last 72 hours. Anemia Panel: No results for input(s): VITAMINB12, FOLATE, FERRITIN, TIBC, IRON, RETICCTPCT in the last 72 hours. Urine analysis:    Component Value Date/Time   COLORURINE AMBER (A) 09/01/2017 0237   APPEARANCEUR CLOUDY (A) 09/01/2017 0237   LABSPEC 1.006 09/01/2017 0237   PHURINE 7.0 09/01/2017 0237   GLUCOSEU NEGATIVE 09/01/2017 0237   HGBUR MODERATE (A) 09/01/2017 0237   BILIRUBINUR NEGATIVE 09/01/2017 0237   KETONESUR NEGATIVE 09/01/2017 0237   PROTEINUR 100 (A) 09/01/2017 0237   UROBILINOGEN 1.0 11/27/2014 0755   NITRITE POSITIVE (A) 09/01/2017 0237   LEUKOCYTESUR LARGE (A) 09/01/2017 0237   Sepsis Labs: @LABRCNTIP (procalcitonin:4,lacticidven:4)  ) Recent Results (from the past 240 hour(s))  Culture, blood (Routine x 2)     Status: Abnormal   Collection Time: 09/01/17  2:37 AM  Result Value Ref Range Status   Specimen Description BLOOD SITE NOT SPECIFIED  Final   Special Requests BOTTLES DRAWN AEROBIC AND ANAEROBIC  Final   Culture  Setup Time   Final    GRAM NEGATIVE  RODS IN BOTH AEROBIC AND ANAEROBIC BOTTLES CRITICAL RESULT CALLED TO, READ BACK BY AND VERIFIED WITH: L BAJBUS PHARMD 2230 09/01/17 A BROWNING    Culture (A)  Final    ESCHERICHIA COLI SUSCEPTIBILITIES PERFORMED ON PREVIOUS CULTURE WITHIN THE LAST 5 DAYS. Performed at [facility], [address] [place], [place] [address]    Report Status 09/03/2017 FINAL  Final  Urine culture     Status: Abnormal   Collection Time: 09/01/17  2:37 AM  Result Value Ref Range Status   Specimen Description URINE, CATHETERIZED  Final   Special Requests NONE  Final   Culture (A)  Final    >=100,000 COLONIES/mL ESCHERICHIA COLI Confirmed Extended Spectrum Beta-Lactamase Producer (ESBL).  In bloodstream infections from ESBL organisms, carbapenems are preferred over piperacillin/tazobactam. They are shown to have a lower risk of mortality. Performed at [facility], [address], [place], [place] [address]    Report Status 09/03/2017 FINAL  Final   Organism ID, Bacteria ESCHERICHIA COLI (A)  Final      Susceptibility   Escherichia coli - MIC*    AMPICILLIN >=32 RESISTANT Resistant     CEFAZOLIN >=64 RESISTANT Resistant     CEFTRIAXONE >=64 RESISTANT Resistant     CIPROFLOXACIN >=4 RESISTANT Resistant     GENTAMICIN >=16 RESISTANT Resistant     IMIPENEM <=0.25 SENSITIVE Sensitive     NITROFURANTOIN <=16 SENSITIVE Sensitive     TRIMETH/SULFA <=20 SENSITIVE Sensitive     AMPICILLIN/SULBACTAM >=32 RESISTANT Resistant     PIP/TAZO 8 SENSITIVE Sensitive     Extended ESBL POSITIVE Resistant     * >=100,000 COLONIES/mL ESCHERICHIA COLI  Culture, blood (Routine x 2)     Status: Abnormal   Collection Time: 09/01/17  2:42 AM  Result Value Ref Range Status   Specimen Description BLOOD SITE NOT SPECIFIED  Final   Special Requests   Final    Blood Culture adequate volume BOTTLES DRAWN AEROBIC AND ANAEROBIC   Culture  Setup Time   Final    GRAM NEGATIVE RODS IN BOTH AEROBIC AND ANAEROBIC  BOTTLES CRITICAL RESULT CALLED TO, READ BACK BY AND VERIFIED WITH: L BAJBUS PHARMD 2230 09/01/17 A BROWNING    Culture (A)  Final  ESCHERICHIA COLI Confirmed Extended Spectrum Beta-Lactamase Producer (ESBL).  In bloodstream infections from ESBL organisms, carbapenems are preferred over piperacillin/tazobactam. They are shown to have a lower risk of mortality. Performed at [facility], [address] [place], [place] [address]    Report Status 09/03/2017 FINAL  Final   Organism ID, Bacteria ESCHERICHIA COLI  Final      Susceptibility   Escherichia coli - MIC*    AMPICILLIN >=32 RESISTANT Resistant     CEFAZOLIN >=64 RESISTANT Resistant     CEFEPIME RESISTANT Resistant     CEFTAZIDIME RESISTANT Resistant     CEFTRIAXONE >=64 RESISTANT Resistant     CIPROFLOXACIN >=4 RESISTANT Resistant     GENTAMICIN >=16 RESISTANT Resistant     IMIPENEM <=0.25 SENSITIVE Sensitive     TRIMETH/SULFA <=20 SENSITIVE Sensitive     AMPICILLIN/SULBACTAM >=32 RESISTANT Resistant     PIP/TAZO 8 SENSITIVE Sensitive     Extended ESBL POSITIVE Resistant     * ESCHERICHIA COLI  Blood Culture ID Panel (Reflexed)     Status: Abnormal   Collection Time: 09/01/17  2:42 AM  Result Value Ref Range Status   Enterococcus species NOT DETECTED NOT DETECTED Final   Listeria monocytogenes NOT DETECTED NOT DETECTED Final   Staphylococcus species NOT DETECTED NOT DETECTED Final   Staphylococcus aureus NOT DETECTED NOT DETECTED Final   Streptococcus species NOT DETECTED NOT DETECTED Final   Streptococcus agalactiae NOT DETECTED NOT DETECTED Final   Streptococcus pneumoniae NOT DETECTED NOT DETECTED Final   Streptococcus pyogenes NOT DETECTED NOT DETECTED Final   Acinetobacter baumannii NOT DETECTED NOT DETECTED Final   Enterobacteriaceae species DETECTED (A) NOT DETECTED Final    Comment: Enterobacteriaceae represent a large family of gram-negative bacteria, not a single organism. CRITICAL RESULT CALLED TO, READ  BACK BY AND VERIFIED WITH: L BAJBUS PHARMD 2230 09/01/17 A BROWNING    Enterobacter cloacae complex NOT DETECTED NOT DETECTED Final   Escherichia coli DETECTED (A) NOT DETECTED Final    Comment: CRITICAL RESULT CALLED TO, READ BACK BY AND VERIFIED WITH: L BAJBUS PHARMD 2230 09/01/17 A BROWNING    Klebsiella oxytoca NOT DETECTED NOT DETECTED Final   Klebsiella pneumoniae NOT DETECTED NOT DETECTED Final   Proteus species NOT DETECTED NOT DETECTED Final   Serratia marcescens NOT DETECTED NOT DETECTED Final   Carbapenem resistance NOT DETECTED NOT DETECTED Final   Haemophilus influenzae NOT DETECTED NOT DETECTED Final   Neisseria meningitidis NOT DETECTED NOT DETECTED Final   Pseudomonas aeruginosa NOT DETECTED NOT DETECTED Final   Candida albicans NOT DETECTED NOT DETECTED Final   Candida glabrata NOT DETECTED NOT DETECTED Final   Candida krusei NOT DETECTED NOT DETECTED Final   Candida parapsilosis NOT DETECTED NOT DETECTED Final   Candida tropicalis NOT DETECTED NOT DETECTED Final    Comment: Performed at [facility], [address], [place], [place] [address]  MRSA PCR Screening     Status: Abnormal   Collection Time: 09/01/17  9:48 PM  Result Value Ref Range Status   MRSA by PCR POSITIVE (A) NEGATIVE Final    Comment:        The GeneXpert MRSA Assay (FDA approved for NASAL specimens only), is one component of a comprehensive MRSA colonization surveillance program. It is not intended to diagnose MRSA infection nor to guide or monitor treatment for MRSA infections. RESULT CALLED TO, READ BACK BY AND VERIFIED WITH: J.MILLER,RN AT 0041 09/02/17 L.PITT   C difficile quick scan  w PCR reflex     Status: None   Collection Time: 09/04/17 10:00 AM  Result Value Ref Range Status   C Diff antigen NEGATIVE NEGATIVE Final   C Diff toxin NEGATIVE NEGATIVE Final   C Diff interpretation No C. difficile detected.  Final    Comment: Performed at [facility], [address], [place], [place] [address]  Culture, blood (routine x 2)     Status: None (Preliminary result)   Collection Time: 09/04/17  2:10 PM  Result Value Ref Range Status   Specimen Description BLOOD RIGHT HAND  Final   Special Requests IN PEDIATRIC BOTTLE Blood Culture adequate volume  Final   Culture  Setup Time   Final    IN PEDIATRIC BOTTLE GRAM POSITIVE COCCI CRITICAL RESULT CALLED TO, READ BACK BY AND VERIFIED WITH: Oneta Rack AT 1652 09/05/17 BY L BENFIELD Performed at [facility], [address] [place], [place] [address]    Culture GRAM POSITIVE COCCI  Final   Report Status PENDING  Incomplete  Blood Culture ID Panel (Reflexed)     Status: Abnormal   Collection Time: 09/04/17  2:10 PM  Result Value Ref Range Status   Enterococcus species NOT DETECTED NOT DETECTED Final   Listeria monocytogenes NOT DETECTED NOT DETECTED Final   Staphylococcus species NOT DETECTED NOT DETECTED Final   Staphylococcus aureus NOT DETECTED NOT DETECTED Final   Streptococcus species NOT DETECTED NOT DETECTED Final   Streptococcus agalactiae NOT DETECTED NOT DETECTED Final   Streptococcus pneumoniae NOT DETECTED NOT DETECTED Final   Streptococcus pyogenes NOT DETECTED NOT DETECTED Final   Acinetobacter baumannii NOT DETECTED NOT DETECTED Final   Enterobacteriaceae species DETECTED (A) NOT DETECTED Final    Comment: Enterobacteriaceae represent a large family of gram negative bacteria, not a single organism. Refer to culture for further identification. CRITICAL RESULT CALLED TO, READ BACK BY AND VERIFIED WITH: M BELL,PHARMD AT 1652 09/05/17 BY L BENFIELD    Enterobacter cloacae complex NOT DETECTED NOT DETECTED Final   Escherichia coli NOT DETECTED NOT DETECTED Final   Klebsiella oxytoca NOT DETECTED NOT DETECTED Final   Klebsiella pneumoniae NOT DETECTED NOT DETECTED Final   Proteus species NOT DETECTED NOT DETECTED Final   Serratia marcescens NOT DETECTED NOT DETECTED Final   Carbapenem  resistance NOT DETECTED NOT DETECTED Final   Haemophilus influenzae NOT DETECTED NOT DETECTED Final   Neisseria meningitidis NOT DETECTED NOT DETECTED Final   Pseudomonas aeruginosa NOT DETECTED NOT DETECTED Final   Candida albicans NOT DETECTED NOT DETECTED Final   Candida glabrata NOT DETECTED NOT DETECTED Final   Candida krusei NOT DETECTED NOT DETECTED Final   Candida parapsilosis NOT DETECTED NOT DETECTED Final   Candida tropicalis NOT DETECTED NOT DETECTED Final    Comment: Performed at [facility], [address], [place], [place] [address]  Culture, blood (routine x 2)     Status: None (Preliminary result)   Collection Time: 09/04/17  2:14 PM  Result Value Ref Range Status   Specimen Description BLOOD RIGHT THUMB  Final   Special Requests IN PEDIATRIC BOTTLE Blood Culture adequate volume  Final   Culture  Setup Time   Final    GRAM POSITIVE COCCI IN PEDIATRIC BOTTLE CRITICAL VALUE NOTED.  VALUE IS CONSISTENT WITH PREVIOUSLY REPORTED AND CALLED VALUE. Performed at [facility], [address], [place], [place] [address]    Culture [facility] POSITIVE COCCI  Final   Report  Status PENDING  Incomplete      Studies: Dg Chest Port 1 View  Result Date: 09/06/2017 CLINICAL DATA:  History of CHF.  Lethargy. EXAM: PORTABLE CHEST 1 VIEW COMPARISON:  None. FINDINGS: Stable cardiomegaly with normal pulmonary vascularity. No focal infiltrate. Interval clearing of pulmonary interstitial prominence. Low lung volumes. No pleural effusion or pneumothorax. IMPRESSION: Stable cardiomegaly with normal pulmonary vascularity. Interval clearing of pulmonary interstitial edema. Low lung volumes. Electronically Signed   By: Maisie Fus  Register   On: 09/06/2017 11:55    Scheduled Meds: . calcium carbonate  1 tablet Oral TID WC  . Chlorhexidine Gluconate Cloth  6 each Topical Q0600  . diltiazem  120 mg Oral Q12H  . feeding supplement (PRO-STAT SUGAR FREE 64)  30 mL Oral TID BM  . insulin aspart   0-9 Units Subcutaneous TID WC  . insulin detemir  5 Units Subcutaneous BID  . multivitamin with minerals  1 tablet Oral Daily  . mupirocin ointment  1 application Nasal BID  . rivaroxaban  20 mg Oral Daily    Continuous Infusions: . diltiazem (CARDIZEM) infusion 10 mg/hr (09/06/17 0417)  . magnesium sulfate 1 - 4 g bolus IVPB    . meropenem (MERREM) IV Stopped (09/06/17 1632)  . potassium PHOSPHATE IVPB (mmol)    . vancomycin    . vancomycin 750 mg (09/06/17 1830)     LOS: 5 days     Briant Cedar, MD [name]   If 7PM-7AM, please contact night-coverage [email or web address] Password [facility] 09/06/2017, 7:34 PM

## 2017-09-06 NOTE — Progress Notes (Signed)
ANTIBIOTIC CONSULT NOTE   Pharmacy Consult for Vanco and Merrem Indication: bacteremia  No Known Allergies  Patient Measurements: Height: 6\' 3"  (190.5 cm) Weight: 249 lb 14.4 oz (113.4 kg) IBW/kg (Calculated) : 84.5 Adjusted Body Weight:    Vital Signs: Temp: 97.7 F (36.5 C) (02/26 1100) Temp Source: Axillary (02/26 1100) BP: 139/65 (02/26 1100) Pulse Rate: 105 (02/26 1100) Intake/Output from previous day: 02/25 0701 - 02/26 0700 In: -  Out: 5550 [Urine:5550] Intake/Output from this shift: Total I/O In: -  Out: 150 [Urine:150]  Labs: Recent Labs    09/04/17 0210 09/05/17 0158 09/05/17 1553 09/05/17 2254 09/06/17 0651  WBC 10.9* 11.2*  --   --  10.7*  HGB 7.5* 8.8*  --   --  8.2*  PLT 263 280  --   --  286  CREATININE 1.41* 1.49* 1.48* 1.49* 1.40*   Estimated Creatinine Clearance: 71.5 mL/min (A) (by C-G formula based on SCr of 1.4 mg/dL (H)). No results for input(s): VANCOTROUGH, VANCOPEAK, VANCORANDOM, GENTTROUGH, GENTPEAK, GENTRANDOM, TOBRATROUGH, TOBRAPEAK, TOBRARND, AMIKACINPEAK, AMIKACINTROU, AMIKACIN in the last 72 hours.   Microbiology:   Medical History: Past Medical History:  Diagnosis Date  . Arthritis    "hands and lower back" (09/19/2014)  . CAD (coronary artery disease)    a. s/p PCI to RCA in 2012. b. prior cath in 01/2014 with elevated L/RH pressures, mild-mod CAD of LAD/LCx with patent RCA, normal EF, c/b CIN/CHF.  02/2014 Carpal tunnel syndrome, bilateral   . Cellulitis   . Chronic diastolic CHF (congestive heart failure) (HCC)   . Chronic kidney disease (CKD), stage III (moderate) (HCC)   . Chronic lower back pain   . Diabetes (HCC)   . DKA (diabetic ketoacidoses) (HCC) 03/2017  . History of blood transfusion ~ 1954   "related to OR"  . Hyperlipidemia   . Hypertension   . Hypoxia    a. Qualified for home O2 at DC in 09/2014.  10/2014 Lower GI bleed   . Microcytic anemia   . Morbid obesity (HCC)   . Neuropathy   . OSA (obstructive sleep apnea)     "I wear nasal prongs; haven't been using prongs recently" (09/19/2014)  . PAF (paroxysmal atrial fibrillation) (HCC)    TEE DCCV 09/23/2014  . Physical deconditioning   . Pilonidal cyst 1980's; 01/25/2013  . Scrotal abscess   . Type II diabetes mellitus (HCC)     Assessment:  ID: UTI>ESBL Ecoli bacteremia. ABX changed to Merrem/Vanco for ESBL-E. Coli - WBC 10.7, Afebrile. Plan 14d abx.  - 2/25 PM: BCID with Enterobacteriaceae species was detected, but gram stain grew gram positive cocci, which was confirmed with lab. Asked lab to re-run BCID with the other set of blood culture, and this was back with nothing detected. It is hard to interpreted the BCID results.  Vanco 2/25>> Meropenem 2/23 >> Ceftriaxone 2/21>>2/23 Zosyn x 1 on 2/21   2/24: BC x 2: GPC x 2  2/24 BCID: Enterbacter species  2/24: CDif neg  2/21 blood x 2 - ESBL-E. coli  2/21 urine - > 100 K/ml ESBL-E.coli  2/21 MRSA PCR positive  2/21 Influeza PCR negative  Goal of Therapy:  Vancomycin trough level 15-20 mcg/ml  Plan:  - Vanco 750mg  IV q 12 hrs. Trough after 3-5 doses at steady state. - Merrem 1g IV q8h  Kiyoshi Schaab S. 3/21, PharmD, BCPS Clinical Staff Pharmacist Pager [phone]  Merilynn Finland 09/06/2017,2:37 PM

## 2017-09-07 ENCOUNTER — Inpatient Hospital Stay (HOSPITAL_COMMUNITY): Payer: Medicare Other

## 2017-09-07 LAB — CBC WITH DIFFERENTIAL/PLATELET
BAND NEUTROPHILS: 0 %
BASOS PCT: 0 %
Basophils Absolute: 0 10*3/uL (ref 0.0–0.1)
Blasts: 0 %
EOS ABS: 1 10*3/uL — AB (ref 0.0–0.7)
Eosinophils Relative: 9 %
HCT: 29.7 % — ABNORMAL LOW (ref 39.0–52.0)
HEMOGLOBIN: 8.8 g/dL — AB (ref 13.0–17.0)
Lymphocytes Relative: 21 %
Lymphs Abs: 2.4 10*3/uL (ref 0.7–4.0)
MCH: 21.2 pg — ABNORMAL LOW (ref 26.0–34.0)
MCHC: 29.6 g/dL — ABNORMAL LOW (ref 30.0–36.0)
MCV: 71.4 fL — ABNORMAL LOW (ref 78.0–100.0)
METAMYELOCYTES PCT: 0 %
MONO ABS: 0.2 10*3/uL (ref 0.1–1.0)
Monocytes Relative: 2 %
Myelocytes: 0 %
Neutro Abs: 7.7 10*3/uL (ref 1.7–7.7)
Neutrophils Relative %: 68 %
Other: 0 %
PROMYELOCYTES ABS: 0 %
Platelets: 367 10*3/uL (ref 150–400)
RBC: 4.16 MIL/uL — ABNORMAL LOW (ref 4.22–5.81)
RDW: 25.3 % — ABNORMAL HIGH (ref 11.5–15.5)
WBC: 11.3 10*3/uL — ABNORMAL HIGH (ref 4.0–10.5)
nRBC: 0 /100 WBC

## 2017-09-07 LAB — CULTURE, BLOOD (ROUTINE X 2)
SPECIAL REQUESTS: ADEQUATE
Special Requests: ADEQUATE

## 2017-09-07 LAB — GLUCOSE, CAPILLARY
GLUCOSE-CAPILLARY: 231 mg/dL — AB (ref 65–99)
GLUCOSE-CAPILLARY: 120 mg/dL — AB (ref 65–99)
Glucose-Capillary: 152 mg/dL — ABNORMAL HIGH (ref 65–99)
Glucose-Capillary: 167 mg/dL — ABNORMAL HIGH (ref 65–99)

## 2017-09-07 LAB — BASIC METABOLIC PANEL
Anion gap: 12 (ref 5–15)
BUN: <5 mg/dL — ABNORMAL LOW (ref 6–20)
CHLORIDE: 105 mmol/L (ref 101–111)
CO2: 30 mmol/L (ref 22–32)
CREATININE: 1.31 mg/dL — AB (ref 0.61–1.24)
Calcium: 8 mg/dL — ABNORMAL LOW (ref 8.9–10.3)
GFR, EST NON AFRICAN AMERICAN: 56 mL/min — AB (ref >60)
Glucose, Bld: 252 mg/dL — ABNORMAL HIGH (ref 65–99)
POTASSIUM: 3.1 mmol/L — AB (ref 3.5–5.1)
SODIUM: 147 mmol/L — AB (ref 135–145)

## 2017-09-07 LAB — MAGNESIUM: MAGNESIUM: 1.9 mg/dL (ref 1.7–2.4)

## 2017-09-07 LAB — PHOSPHORUS: PHOSPHORUS: 4.1 mg/dL (ref 2.5–4.6)

## 2017-09-07 MED ORDER — POTASSIUM CHLORIDE 10 MEQ/100ML IV SOLN
10.0000 meq | INTRAVENOUS | Status: AC
Start: 2017-09-07 — End: 2017-09-07
  Administered 2017-09-07 (×3): 10 meq via INTRAVENOUS
  Filled 2017-09-07 (×3): qty 100

## 2017-09-07 MED ORDER — PROMETHAZINE HCL 25 MG/ML IJ SOLN
12.5000 mg | Freq: Once | INTRAMUSCULAR | Status: AC
  Administered 2017-09-07: 12.5 mg via INTRAVENOUS
  Filled 2017-09-07: qty 1

## 2017-09-07 MED ORDER — MAGNESIUM SULFATE 2 GM/50ML IV SOLN
2.0000 g | Freq: Once | INTRAVENOUS | Status: AC
  Administered 2017-09-07: 2 g via INTRAVENOUS
  Filled 2017-09-07: qty 50

## 2017-09-07 NOTE — Consult Note (Signed)
   [facility] CM Inpatient Consult   09/07/2017  [name] 09-02-1950 [id number]  Patient listed in the [facility] ACO but patient is currently a resident at [facility] prior to admission.  No current [facility] follow up needs for care management noted,  Charlesetta Shanks, RN BSN CCM [facility]  [phone] business mobile phone Toll free office [phone]

## 2017-09-07 NOTE — Progress Notes (Signed)
SLP Cancellation Note  Patient Details Name: [name] MRN: [id number] DOB: 04-Mar-1951   Cancelled treatment:        Pt had Ativan early this morning and unable to maintain alertness for swallow assessment. Will see tomorrow.    Royce Macadamia 09/07/2017, 2:51 PM   Breck Coons Lonell Face.Ed [employer] [phone]

## 2017-09-07 NOTE — Progress Notes (Signed)
PROGRESS NOTE    ALCIDE [name]  ZOX:[id number] DOB: 03/30/1951 DOA: 09/01/2017 PCP: [name], MD (Inactive)   Brief Narrative25 year old male with past medical history of atrial fibrillation on xarelto, DM, diastolic heart failure, OSA, stage III CKD and status post right AKA admitted on the early morning of 2/21 from his nursing facility with fever and acute encephalopathy. Patient has had previous hospitalizations for acute urinary retention leading to UTI and sepsis. In the emergency room, patient found to be in sepsis secondary to UTI again, Afib with RVR. Pt was started on diltiazem ggt and admitted in the ICU for further management.  Today, pt was noted to be more awake, with improved WOB. Still yelling intermittently due to non-focal pain. Pt still with diarrhea, flexiseal draining liquid brown stool. Pt still lethargic, unable to tolerate orally, although constantly demanding soda. Palliative consulted for GOC. Tried to contact sister, no answer.    Assessment & Plan:   Principal Problem:   UTI (urinary tract infection) Active Problems:   Hyperlipidemia   HYPOKALEMIA   Obesity, Class III, BMI 40-49.9 (morbid obesity) (HCC)   ANEMIA   CAD S/P percutaneous coronary angioplasty   Long term (current) use of anticoagulants   Chronic diastolic congestive heart failure (HCC)   CKD (chronic kidney disease) stage 3, GFR 30-59 ml/min (HCC)   Atrial fibrillation with RVR (HCC)   Sepsis, unspecified organism (HCC)   Pressure ulcer   Acute metabolic encephalopathy   S/P AKA (above knee amputation) unilateral, right (HCC)  Sepsis secondary to ESBL bacteremia Last temp spike on 2/22, improving leukocytosis, BP still soft Lactic acidosis resolved, procalcitonin 1.56 BC X 2 grew ESBL, repeat BC growing gram + cocci, ordered another repeat Spoke to ID, plan to treat for about 2 weeks or less, pending on clinical course Continue meropenem, continue Vancomycin empirically ,  pharmacy to dose Monitor closely  UTI due to ESBL Recurrent, hx of urinary retension UC grew ESBL Foley in, due to retension May need urology work up after bacteremia clears Continue Meropenem  Severe electrolyte imbalance Hypokalemia/hypocalcemia/Hypophosphatemia/Hypomagnesemia/hypernatremia likely ??Refeeding syndrome Replace electrolytes prn aggressively  Unable to tolerate orally as pt lethargic, SLP consulted Nutrition consulted  Acute metabolic encephalopathy  Likely due to above CT head on admission negative Management as above  Acute on Chronic diastolic heart failure Appears overloaded, s/p IV hydration for sepsis BNP 1,401 elevated from baseline CXR: CHF with pulm edema IV lasix prn with close monitoring of BP and renal fxn Strict I&O, daily weight  Acute kidney injury in the setting of CKD stage 3 Baseline Cr ~ 1.4 Improving Likely due to above Avoid nephrotoxics  Chronic Atrial fibrillation with RVR In Afib with HR in the 90s, fluctuates Likely due to above, sepsis Continue Cardizem gtt, start to wean off and start PO diltiazem and metoprolol Chads 2 score of 5, continue xarelto  Microcytic anemia Baseline hgb ~ 8 Iron panel: Fe 33, TIBC 113, sats 29, Ferritin 1,237 FOBT negative Monitor closely as pt on AC  Diarrhea Multiple episode, liquid stool, flexi-seal placed C.diff negative   DM type 2 A1c 9.6 on 10/18 CBG stable SSI, detemir, held home insulin regimen  CAD S/P percutaneous coronary angioplasty  Chest x-ray done today showed cardiomegaly with with congestion and mild edema.  Sacral/decubitus ulcer Wound care consult  Morbid Obesity Lifestyle modification  GOC Palliative consulted      DVT prophylaxis: Xarelto Code Status: Full code Family Communication: Called Sister [name] at [phone] and left  message to call me back. Disposition Plan: DC back to nursing home when stable. Consultants:  Palliative  care Procedures: None Antimicrobials: Vancomycin and meropenem and ceftriaxone  Subjective: Patient sleeping not really waking up to have a conversation.  Appears tachypneic.   Objective: Vitals:   09/07/17 0500 09/07/17 0700 09/07/17 0800 09/07/17 0900  BP:  (!) 152/93 135/71 119/72  Pulse:  97 72 85  Resp:  (!) 22 17 (!) 32  Temp:  98.9 F (37.2 C)    TempSrc:  Axillary    SpO2:  97% 97% 91%  Weight: 114.6 kg (252 lb 10.4 oz)     Height:        Intake/Output Summary (Last 24 hours) at 09/07/2017 0954 Last data filed at 09/07/2017 0551 Gross per 24 hour  Intake 1010 ml  Output 300 ml  Net 710 ml   Filed Weights   09/05/17 0415 09/06/17 0415 09/07/17 0500  Weight: 105.4 kg (232 lb 5.8 oz) 113.4 kg (249 lb 14.4 oz) 114.6 kg (252 lb 10.4 oz)    Examination:  General exam: Appears calm and comfortable  Respiratory system: Clear to auscultation. Respiratory effort normal. Cardiovascular system: S1 & S2 heard,irreg . No JVD, murmurs, rubs, gallops or clicks. No pedal edema. Gastrointestinal system: Abdomen is nondistended, soft and nontender. No organomegaly or masses felt. Normal bowel sounds heard. Central nervous system: Alert and oriented. No focal neurological deficits. Extremities: Symmetric 5 x 5 power. Skin: No rashes, lesions or ulcers Psychiatry: Judgement and insight appear normal. Mood & affect appropriate.     Data Reviewed: I have personally reviewed following labs and imaging studies  CBC: Recent Labs  Lab 09/01/17 0237  09/03/17 0239 09/04/17 0210 09/05/17 0158 09/06/17 0651 09/07/17 0302  WBC 35.5*   < > 10.4 10.9* 11.2* 10.7* 11.3*  NEUTROABS 31.6*  --   --  7.7 7.8* 5.9 7.7  HGB 9.7*   < > 8.3* 7.5* 8.8* 8.2* 8.8*  HCT 32.9*   < > 29.0* 26.2* 30.0* 27.9* 29.7*  MCV 73.4*   < > 73.4* 72.6* 71.9* 71.0* 71.4*  PLT 505*   < > 294 263 280 286 367   < > = values in this interval not displayed.   Basic Metabolic Panel: Recent Labs  Lab  09/05/17 1553 09/05/17 2254 09/06/17 0651 09/06/17 1717 09/07/17 0302  NA 149* 150* 148* 147* 147*  K 2.3* 2.4* 3.0* 3.1* 3.1*  CL 109 109 107 107 105  CO2 26 28 29 28 30   GLUCOSE 173* 171* 233* 181* 252*  BUN 7 6 6  5* <5*  CREATININE 1.48* 1.49* 1.40* 1.30* 1.31*  CALCIUM 7.4* 7.7* 7.8* 7.7* 8.0*  MG 1.5* 1.9 1.7 1.6* 1.9  PHOS 1.2* 1.3* 2.2* 1.7* 4.1   GFR: Estimated Creatinine Clearance: 76.7 mL/min (A) (by C-G formula based on SCr of 1.31 mg/dL (H)). Liver Function Tests: Recent Labs  Lab 09/01/17 0237  AST 38  ALT 12*  ALKPHOS 132*  BILITOT 1.6*  PROT 7.7  ALBUMIN 2.0*   No results for input(s): LIPASE, AMYLASE in the last 168 hours. No results for input(s): AMMONIA in the last 168 hours. Coagulation Profile: Recent Labs  Lab 09/01/17 0237  INR 1.49   Cardiac Enzymes: Recent Labs  Lab 09/05/17 2254  CKTOTAL 28*   BNP (last 3 results) No results for input(s): PROBNP in the last 8760 hours. HbA1C: No results for input(s): HGBA1C in the last 72 hours. CBG: Recent Labs  Lab 09/06/17  0740 09/06/17 1129 09/06/17 1828 09/06/17 2110 09/07/17 0749  GLUCAP 217* 149* 164* 173* 231*   Lipid Profile: No results for input(s): CHOL, HDL, LDLCALC, TRIG, CHOLHDL, LDLDIRECT in the last 72 hours. Thyroid Function Tests: No results for input(s): TSH, T4TOTAL, FREET4, T3FREE, THYROIDAB in the last 72 hours. Anemia Panel: No results for input(s): VITAMINB12, FOLATE, FERRITIN, TIBC, IRON, RETICCTPCT in the last 72 hours. Sepsis Labs: Recent Labs  Lab 09/01/17 0432 09/01/17 0529 09/02/17 1138 09/03/17 0239  PROCALCITON  --  1.56  --   --   LATICACIDVEN 3.09* 3.2* 1.8 1.4    Recent Results (from the past 240 hour(s))  Culture, blood (Routine x 2)     Status: Abnormal   Collection Time: 09/01/17  2:37 AM  Result Value Ref Range Status   Specimen Description BLOOD SITE NOT SPECIFIED  Final   Special Requests BOTTLES DRAWN AEROBIC AND ANAEROBIC  Final    Culture  Setup Time   Final    GRAM NEGATIVE RODS IN BOTH AEROBIC AND ANAEROBIC BOTTLES CRITICAL RESULT CALLED TO, READ BACK BY AND VERIFIED WITH: L BAJBUS PHARMD 2230 09/01/17 A BROWNING    Culture (A)  Final    ESCHERICHIA COLI SUSCEPTIBILITIES PERFORMED ON PREVIOUS CULTURE WITHIN THE LAST 5 DAYS. Performed at [facility], [address] [place], [place] [address]    Report Status 09/03/2017 FINAL  Final  Urine culture     Status: Abnormal   Collection Time: 09/01/17  2:37 AM  Result Value Ref Range Status   Specimen Description URINE, CATHETERIZED  Final   Special Requests NONE  Final   Culture (A)  Final    >=100,000 COLONIES/mL ESCHERICHIA COLI Confirmed Extended Spectrum Beta-Lactamase Producer (ESBL).  In bloodstream infections from ESBL organisms, carbapenems are preferred over piperacillin/tazobactam. They are shown to have a lower risk of mortality. Performed at [facility], [address], [place], [place] [address]    Report Status 09/03/2017 FINAL  Final   Organism ID, Bacteria ESCHERICHIA COLI (A)  Final      Susceptibility   Escherichia coli - MIC*    AMPICILLIN >=32 RESISTANT Resistant     CEFAZOLIN >=64 RESISTANT Resistant     CEFTRIAXONE >=64 RESISTANT Resistant     CIPROFLOXACIN >=4 RESISTANT Resistant     GENTAMICIN >=16 RESISTANT Resistant     IMIPENEM <=0.25 SENSITIVE Sensitive     NITROFURANTOIN <=16 SENSITIVE Sensitive     TRIMETH/SULFA <=20 SENSITIVE Sensitive     AMPICILLIN/SULBACTAM >=32 RESISTANT Resistant     PIP/TAZO 8 SENSITIVE Sensitive     Extended ESBL POSITIVE Resistant     * >=100,000 COLONIES/mL ESCHERICHIA COLI  Culture, blood (Routine x 2)     Status: Abnormal   Collection Time: 09/01/17  2:42 AM  Result Value Ref Range Status   Specimen Description BLOOD SITE NOT SPECIFIED  Final   Special Requests   Final    Blood Culture adequate volume BOTTLES DRAWN AEROBIC AND ANAEROBIC   Culture  Setup Time   Final    GRAM NEGATIVE  RODS IN BOTH AEROBIC AND ANAEROBIC BOTTLES CRITICAL RESULT CALLED TO, READ BACK BY AND VERIFIED WITH: L BAJBUS PHARMD 2230 09/01/17 A BROWNING    Culture (A)  Final    ESCHERICHIA COLI Confirmed Extended Spectrum Beta-Lactamase Producer (ESBL).  In bloodstream infections from ESBL organisms, carbapenems are preferred over piperacillin/tazobactam. They are shown to have a lower risk of mortality. Performed at [facility],  [address] [place], [place] [address]    Report Status 09/03/2017 FINAL  Final   Organism ID, Bacteria ESCHERICHIA COLI  Final      Susceptibility   Escherichia coli - MIC*    AMPICILLIN >=32 RESISTANT Resistant     CEFAZOLIN >=64 RESISTANT Resistant     CEFEPIME RESISTANT Resistant     CEFTAZIDIME RESISTANT Resistant     CEFTRIAXONE >=64 RESISTANT Resistant     CIPROFLOXACIN >=4 RESISTANT Resistant     GENTAMICIN >=16 RESISTANT Resistant     IMIPENEM <=0.25 SENSITIVE Sensitive     TRIMETH/SULFA <=20 SENSITIVE Sensitive     AMPICILLIN/SULBACTAM >=32 RESISTANT Resistant     PIP/TAZO 8 SENSITIVE Sensitive     Extended ESBL POSITIVE Resistant     * ESCHERICHIA COLI  Blood Culture ID Panel (Reflexed)     Status: Abnormal   Collection Time: 09/01/17  2:42 AM  Result Value Ref Range Status   Enterococcus species NOT DETECTED NOT DETECTED Final   Listeria monocytogenes NOT DETECTED NOT DETECTED Final   Staphylococcus species NOT DETECTED NOT DETECTED Final   Staphylococcus aureus NOT DETECTED NOT DETECTED Final   Streptococcus species NOT DETECTED NOT DETECTED Final   Streptococcus agalactiae NOT DETECTED NOT DETECTED Final   Streptococcus pneumoniae NOT DETECTED NOT DETECTED Final   Streptococcus pyogenes NOT DETECTED NOT DETECTED Final   Acinetobacter baumannii NOT DETECTED NOT DETECTED Final   Enterobacteriaceae species DETECTED (A) NOT DETECTED Final    Comment: Enterobacteriaceae represent a large family of gram-negative bacteria, not a single  organism. CRITICAL RESULT CALLED TO, READ BACK BY AND VERIFIED WITH: L BAJBUS PHARMD 2230 09/01/17 A BROWNING    Enterobacter cloacae complex NOT DETECTED NOT DETECTED Final   Escherichia coli DETECTED (A) NOT DETECTED Final    Comment: CRITICAL RESULT CALLED TO, READ BACK BY AND VERIFIED WITH: L BAJBUS PHARMD 2230 09/01/17 A BROWNING    Klebsiella oxytoca NOT DETECTED NOT DETECTED Final   Klebsiella pneumoniae NOT DETECTED NOT DETECTED Final   Proteus species NOT DETECTED NOT DETECTED Final   Serratia marcescens NOT DETECTED NOT DETECTED Final   Carbapenem resistance NOT DETECTED NOT DETECTED Final   Haemophilus influenzae NOT DETECTED NOT DETECTED Final   Neisseria meningitidis NOT DETECTED NOT DETECTED Final   Pseudomonas aeruginosa NOT DETECTED NOT DETECTED Final   Candida albicans NOT DETECTED NOT DETECTED Final   Candida glabrata NOT DETECTED NOT DETECTED Final   Candida krusei NOT DETECTED NOT DETECTED Final   Candida parapsilosis NOT DETECTED NOT DETECTED Final   Candida tropicalis NOT DETECTED NOT DETECTED Final    Comment: Performed at [facility], [address] [place], [place] [address]  MRSA PCR Screening     Status: Abnormal   Collection Time: 09/01/17  9:48 PM  Result Value Ref Range Status   MRSA by PCR POSITIVE (A) NEGATIVE Final    Comment:        The GeneXpert MRSA Assay (FDA approved for NASAL specimens only), is one component of a comprehensive MRSA colonization surveillance program. It is not intended to diagnose MRSA infection nor to guide or monitor treatment for MRSA infections. RESULT CALLED TO, READ BACK BY AND VERIFIED WITH: J.MILLER,RN AT 0041 09/02/17 L.PITT   C difficile quick scan w PCR reflex     Status: None   Collection Time: 09/04/17 10:00 AM  Result Value Ref Range Status   C Diff antigen NEGATIVE NEGATIVE Final   C Diff toxin NEGATIVE  NEGATIVE Final   C Diff interpretation No C. difficile detected.  Final    Comment: Performed  at [facility], [address] [place], [place] [address]  Culture, blood (routine x 2)     Status: Abnormal   Collection Time: 09/04/17  2:10 PM  Result Value Ref Range Status   Specimen Description BLOOD RIGHT HAND  Final   Special Requests IN PEDIATRIC BOTTLE Blood Culture adequate volume  Final   Culture  Setup Time   Final    IN PEDIATRIC BOTTLE GRAM POSITIVE COCCI CRITICAL RESULT CALLED TO, READ BACK BY AND VERIFIED WITH: Oneta Rack AT 1652 09/05/17 BY L BENFIELD Performed at [facility], [address] [place], [place] [address]    Culture STAPHYLOCOCCUS SPECIES (COAGULASE NEGATIVE) (A)  Final   Report Status 09/07/2017 FINAL  Final   Organism ID, Bacteria STAPHYLOCOCCUS SPECIES (COAGULASE NEGATIVE)  Final      Susceptibility   Staphylococcus species (coagulase negative) - MIC*    CIPROFLOXACIN >=8 RESISTANT Resistant     ERYTHROMYCIN 4 INTERMEDIATE Intermediate     GENTAMICIN <=0.5 SENSITIVE Sensitive     OXACILLIN >=4 RESISTANT Resistant     TETRACYCLINE <=1 SENSITIVE Sensitive     VANCOMYCIN 1 SENSITIVE Sensitive     TRIMETH/SULFA <=10 SENSITIVE Sensitive     CLINDAMYCIN RESISTANT Resistant     RIFAMPIN <=0.5 SENSITIVE Sensitive     Inducible Clindamycin POSITIVE Resistant     * STAPHYLOCOCCUS SPECIES (COAGULASE NEGATIVE)  Blood Culture ID Panel (Reflexed)     Status: Abnormal   Collection Time: 09/04/17  2:10 PM  Result Value Ref Range Status   Enterococcus species NOT DETECTED NOT DETECTED Final   Listeria monocytogenes NOT DETECTED NOT DETECTED Final   Staphylococcus species NOT DETECTED NOT DETECTED Final   Staphylococcus aureus NOT DETECTED NOT DETECTED Final   Streptococcus species NOT DETECTED NOT DETECTED Final   Streptococcus agalactiae NOT DETECTED NOT DETECTED Final   Streptococcus pneumoniae NOT DETECTED NOT DETECTED Final   Streptococcus pyogenes NOT DETECTED NOT DETECTED Final   Acinetobacter baumannii NOT DETECTED NOT DETECTED Final    Enterobacteriaceae species DETECTED (A) NOT DETECTED Final    Comment: Enterobacteriaceae represent a large family of gram negative bacteria, not a single organism. Refer to culture for further identification. CRITICAL RESULT CALLED TO, READ BACK BY AND VERIFIED WITH: M BELL,PHARMD AT 1652 09/05/17 BY L BENFIELD    Enterobacter cloacae complex NOT DETECTED NOT DETECTED Final   Escherichia coli NOT DETECTED NOT DETECTED Final   Klebsiella oxytoca NOT DETECTED NOT DETECTED Final   Klebsiella pneumoniae NOT DETECTED NOT DETECTED Final   Proteus species NOT DETECTED NOT DETECTED Final   Serratia marcescens NOT DETECTED NOT DETECTED Final   Carbapenem resistance NOT DETECTED NOT DETECTED Final   Haemophilus influenzae NOT DETECTED NOT DETECTED Final   Neisseria meningitidis NOT DETECTED NOT DETECTED Final   Pseudomonas aeruginosa NOT DETECTED NOT DETECTED Final   Candida albicans NOT DETECTED NOT DETECTED Final   Candida glabrata NOT DETECTED NOT DETECTED Final   Candida krusei NOT DETECTED NOT DETECTED Final   Candida parapsilosis NOT DETECTED NOT DETECTED Final   Candida tropicalis NOT DETECTED NOT DETECTED Final    Comment: Performed at [facility], [address] [place], [place] [address]  Culture, blood (routine x 2)     Status: Abnormal   Collection Time: 09/04/17  2:14 PM  Result Value Ref Range Status   Specimen  Description BLOOD RIGHT THUMB  Final   Special Requests IN PEDIATRIC BOTTLE Blood Culture adequate volume  Final   Culture  Setup Time   Final    GRAM POSITIVE COCCI IN PEDIATRIC BOTTLE CRITICAL VALUE NOTED.  VALUE IS CONSISTENT WITH PREVIOUSLY REPORTED AND CALLED VALUE.    Culture (A)  Final    STAPHYLOCOCCUS SPECIES (COAGULASE NEGATIVE) SUSCEPTIBILITIES PERFORMED ON PREVIOUS CULTURE WITHIN THE LAST 5 DAYS. Performed at [facility], [address], [place], [place] [address]    Report Status 09/07/2017 FINAL  Final         Radiology Studies: Dg  Chest Port 1 View  Result Date: 09/07/2017 CLINICAL DATA:  Question aspiration EXAM: PORTABLE CHEST 1 VIEW COMPARISON:  09/06/2017 FINDINGS: Cardiomegaly with vascular congestion. Low lung volumes. Mild interstitial prominence could reflect interstitial edema. No effusions or acute bony abnormality. IMPRESSION: Cardiomegaly with vascular congestion and suspected mild interstitial edema. Low lung volumes. Electronically Signed   By: Charlett Nose M.D.   On: 09/07/2017 01:50   Dg Chest Port 1 View  Result Date: 09/06/2017 CLINICAL DATA:  History of CHF.  Lethargy. EXAM: PORTABLE CHEST 1 VIEW COMPARISON:  None. FINDINGS: Stable cardiomegaly with normal pulmonary vascularity. No focal infiltrate. Interval clearing of pulmonary interstitial prominence. Low lung volumes. No pleural effusion or pneumothorax. IMPRESSION: Stable cardiomegaly with normal pulmonary vascularity. Interval clearing of pulmonary interstitial edema. Low lung volumes. Electronically Signed   By: Maisie Fus  Register   On: 09/06/2017 11:55        Scheduled Meds: . calcium carbonate  1 tablet Oral TID WC  . Chlorhexidine Gluconate Cloth  6 each Topical Q0600  . diltiazem  120 mg Oral Q12H  . feeding supplement (PRO-STAT SUGAR FREE 64)  30 mL Oral TID BM  . insulin aspart  0-9 Units Subcutaneous TID WC  . insulin detemir  5 Units Subcutaneous BID  . metoprolol succinate  50 mg Oral Daily  . multivitamin with minerals  1 tablet Oral Daily  . mupirocin ointment  1 application Nasal BID  . rivaroxaban  20 mg Oral Daily   Continuous Infusions: . diltiazem (CARDIZEM) infusion 10 mg/hr (09/07/17 0120)  . meropenem (MERREM) IV Stopped (09/07/17 2542)  . potassium chloride Stopped (09/07/17 0914)  . vancomycin    . vancomycin Stopped (09/07/17 7062)     LOS: 6 days     Alwyn Ren, MD [name] 7AM, please contact night-coverage [email or web address] Password [facility] 09/07/2017, 9:54 AM

## 2017-09-07 NOTE — Progress Notes (Signed)
ANTICOAGULATION CONSULT NOTE - Follow Up Consult  Pharmacy Consult for Xarelto Indication: atrial fibrillation  No Known Allergies  Patient Measurements: Height: 6\' 3"  (190.5 cm) Weight: 252 lb 10.4 oz (114.6 kg) IBW/kg (Calculated) : 84.5  Vital Signs: Temp: 98.6 F (37 C) (02/27 1100) Temp Source: Axillary (02/27 1100) BP: 135/72 (02/27 1100) Pulse Rate: 81 (02/27 1100)  Labs: Recent Labs    09/05/17 0158  09/05/17 2254 09/06/17 0651 09/06/17 1717 09/07/17 0302  HGB 8.8*  --   --  8.2*  --  8.8*  HCT 30.0*  --   --  27.9*  --  29.7*  PLT 280  --   --  286  --  367  CREATININE 1.49*   < > 1.49* 1.40* 1.30* 1.31*  CKTOTAL  --   --  28*  --   --   --    < > = values in this interval not displayed.    Estimated Creatinine Clearance: 76.7 mL/min (A) (by C-G formula based on SCr of 1.31 mg/dL (H)).  Assessment:  Anticoag: Xarelto PTA for afib. LMWH>Xarelto resumed. Scr 1.31 (CrCl 71). Hgb 8.8 today. Plts ok. Patient with vomiting today 2/27 can hopefully take meds tonight.  Goal of Therapy:  Therapeutic oral anticoagulation   Plan:  Xarelto 20mg  daily  Lillion Elbert S. 3/27, PharmD, BCPS Clinical Staff Pharmacist Pager [phone]  Merilynn Finland Stillinger 09/07/2017,11:52 AM

## 2017-09-07 NOTE — Progress Notes (Addendum)
Pt heard coughing from nurses station, RN entered room pt laying flat in bed projectile vomiting brown emesis. RN raised head HOB, pt A&O x1 (baseline for this admission). Zofran given IV. Lung sounds clear sats high 90's. Pt now has a congested cough he is unable to clear. MD paged for ? Aspiration to obtain a CXR. Orders received for stat CXR .Will continue to monitor    Update 0110 Pt vomiting again, brown emesis. Zofran given, no relief. MD made aware, orders received for phenergan 12.5 mg IV. MD advised to monitor pt and page back if no relief. Will continue to monitor.

## 2017-09-08 DIAGNOSIS — Z7189 Other specified counseling: Secondary | ICD-10-CM

## 2017-09-08 DIAGNOSIS — G9340 Encephalopathy, unspecified: Secondary | ICD-10-CM

## 2017-09-08 LAB — CBC WITH DIFFERENTIAL/PLATELET
BASOS ABS: 0 10*3/uL (ref 0.0–0.1)
Basophils Relative: 0 %
Eosinophils Absolute: 1.4 10*3/uL — ABNORMAL HIGH (ref 0.0–0.7)
Eosinophils Relative: 11 %
HEMATOCRIT: 28 % — AB (ref 39.0–52.0)
HEMOGLOBIN: 8 g/dL — AB (ref 13.0–17.0)
LYMPHS PCT: 25 %
Lymphs Abs: 3.3 10*3/uL (ref 0.7–4.0)
MCH: 20.7 pg — ABNORMAL LOW (ref 26.0–34.0)
MCHC: 28.6 g/dL — AB (ref 30.0–36.0)
MCV: 72.4 fL — ABNORMAL LOW (ref 78.0–100.0)
MONOS PCT: 5 %
Monocytes Absolute: 0.7 10*3/uL (ref 0.1–1.0)
Neutro Abs: 7.6 10*3/uL (ref 1.7–7.7)
Neutrophils Relative %: 59 %
Platelets: 391 10*3/uL (ref 150–400)
RBC: 3.87 MIL/uL — AB (ref 4.22–5.81)
RDW: 25.3 % — ABNORMAL HIGH (ref 11.5–15.5)
WBC: 13 10*3/uL — ABNORMAL HIGH (ref 4.0–10.5)

## 2017-09-08 LAB — GLUCOSE, CAPILLARY
GLUCOSE-CAPILLARY: 119 mg/dL — AB (ref 65–99)
GLUCOSE-CAPILLARY: 114 mg/dL — AB (ref 65–99)
Glucose-Capillary: 171 mg/dL — ABNORMAL HIGH (ref 65–99)
Glucose-Capillary: 162 mg/dL — ABNORMAL HIGH (ref 65–99)

## 2017-09-08 LAB — BLOOD GAS, ARTERIAL
Acid-Base Excess: 10.9 mmol/L — ABNORMAL HIGH (ref 0.0–2.0)
Bicarbonate: 34.3 mmol/L — ABNORMAL HIGH (ref 20.0–28.0)
DRAWN BY: 518061
FIO2: 21
O2 Saturation: 88.8 %
PCO2 ART: 39.1 mmHg (ref 32.0–48.0)
PH ART: 7.551 — AB (ref 7.350–7.450)
Patient temperature: 98.6
pO2, Arterial: 56.7 mmHg — ABNORMAL LOW (ref 83.0–108.0)

## 2017-09-08 LAB — BASIC METABOLIC PANEL
ANION GAP: 12 (ref 5–15)
BUN: <5 mg/dL — ABNORMAL LOW (ref 6–20)
CHLORIDE: 105 mmol/L (ref 101–111)
CO2: 30 mmol/L (ref 22–32)
Calcium: 7.7 mg/dL — ABNORMAL LOW (ref 8.9–10.3)
Creatinine, Ser: 1.24 mg/dL (ref 0.61–1.24)
GFR calc non Af Amer: 59 mL/min — ABNORMAL LOW (ref >60)
Glucose, Bld: 164 mg/dL — ABNORMAL HIGH (ref 65–99)
Potassium: 3 mmol/L — ABNORMAL LOW (ref 3.5–5.1)
Sodium: 147 mmol/L — ABNORMAL HIGH (ref 135–145)

## 2017-09-08 LAB — AMMONIA: AMMONIA: 28 umol/L (ref 9–35)

## 2017-09-08 MED ORDER — POTASSIUM CHLORIDE IN NACL 40-0.9 MEQ/L-% IV SOLN
INTRAVENOUS | Status: DC
  Administered 2017-09-08 – 2017-09-11 (×8): 125 mL/h via INTRAVENOUS
  Filled 2017-09-08 (×10): qty 1000

## 2017-09-08 MED ORDER — LOPERAMIDE HCL 2 MG PO CAPS
4.0000 mg | ORAL_CAPSULE | Freq: Four times a day (QID) | ORAL | Status: DC | PRN
  Filled 2017-09-08: qty 2

## 2017-09-08 NOTE — Progress Notes (Addendum)
PROGRESS NOTE    [name]  RUE:[id number] DOB: [birth year] DOA: 09/01/2017 PCP: [name], MD (Inactive)  Brief Narrative:Narrative55 year old male with past medical history of atrial fibrillation on xarelto, DM, diastolic heart failure, OSA, stage III CKD and status post right AKA admitted on the early morning of 2/21 from his nursing facility with fever and acute encephalopathy. Patient has had previous hospitalizations for acute urinary retention leading to UTI and sepsis. In the emergency room, patient found to be in sepsis secondary to UTI again, Afib with RVR. Pt was started on diltiazem ggt and admitted in the ICU for further management.  Today, pt was noted to be more awake, with improved WOB.Still yellingintermittently due to non-focal pain. Pt still with diarrhea, flexiseal draining liquid brown stool. Pt still lethargic, unable to tolerate orally, although constantly demanding soda. Palliative consulted for GOC. Tried to contact sister, no answer.  09/08/2017 patient in bed but not able to wake him up or start a conversation he opens his eyes on deep rub to his chest.  He has not had anything to eat for many days.  I have discussed this with the sister who is his POA who will be in to see the patient today.  I have also change his CODE STATUS to DO NOT RESUSCITATE after discussing with his sister.  Palliative care to see the patient today. Assessment & Plan:   Principal Problem:   UTI (urinary tract infection) Active Problems:   Hyperlipidemia   HYPOKALEMIA   Obesity, Class III, BMI 40-49.9 (morbid obesity) (HCC)   ANEMIA   CAD S/P percutaneous coronary angioplasty   Long term (current) use of anticoagulants   Chronic diastolic congestive heart failure (HCC)   CKD (chronic kidney disease) stage 3, GFR 30-59 ml/min (HCC)   Atrial fibrillation with RVR (HCC)   Sepsis, unspecified organism (HCC)   Pressure ulcer   Acute metabolic encephalopathy   S/P AKA (above knee  amputation) unilateral, right (HCC) Sepsis secondary to ESBL bacteremia Last temp spike on 2/22, improving leukocytosis, BP still soft Lactic acidosis resolved, procalcitonin 1.56 BC X 2 grew ESBL, repeat BC growing gram + cocci, ordered another repeat Spoke to ID, plan to treat for about 2 weeks or less, pending on clinical course Continue meropenem,continueVancomycin empirically , pharmacy to dose Monitor closely  UTI due to ESBL Recurrent, hx of urinary retension UC grew ESBL Foley in, due to retension May need urology work up after bacteremia clears Continue Meropenem  DIARRHEA CDIFF NEGATIVE.  Severe electrolyte imbalance Hypokalemia/hypocalcemia/Hypophosphatemia/Hypomagnesemia/hypernatremia likely ??Refeeding syndrome Replace electrolytes prn aggressively Unable to tolerate orally as pt lethargic, SLP consulted Nutritionconsulted  Acute metabolic encephalopathy Likely due to above CT head on admission negative Management as above  Acute on Chronic diastolic heart failure Appears overloaded, s/p IV hydration for sepsis BNP 1,401 elevated from baseline CXR: CHF with pulm edema IV lasixprnwith close monitoring of BP and renal fxn Strict I&O, daily weight  Acute kidney injury in the setting of CKD stage 3 Baseline Cr ~ 1.4 Improving Likely due to above Avoid nephrotoxics  Chronic Atrial fibrillation with RVR In Afib with HRin the 90s, fluctuates Likely due to above, sepsis Continue Cardizem gtt,startto wean offand start PO diltiazem and metoprolol Chads 2 score of 5, continue xarelto  Microcytic anemia Baseline hgb ~ 8 Iron panel: Fe 33, TIBC 113, sats 29, Ferritin 1,237 FOBT negative Monitor closely as pt on AC  Diarrhea Multiple episode, liquid stool, flexi-seal placed C.diff negative   DM  type 2 A1c 9.6 on 10/18 CBG stable SSI, detemir, held home insulin regimen  CAD S/P percutaneous coronary angioplasty Chest x-ray done today  showed cardiomegaly with with congestion and mild edema.  Sacral/decubitus ulcer Wound care consult  Morbid Obesity Lifestyle modification  GOC Palliative consulted       DVT prophylaxis: XARELTO Code StatusDNR Family Communication: DW SISTER . Disposition Plan: TBD  Consultants: Palliative care  Procedures: None Antimicrobials: Meropenem vancomycin  Subjective: Confused in bed   Objective: Vitals:   09/07/17 2000 09/08/17 0001 09/08/17 0300 09/08/17 0800  BP: (!) 144/72 140/71 (!) 132/57 128/77  Pulse: 85 78 76 78  Resp: 16 15 (!) 24 (!) 21  Temp: 97.7 F (36.5 C) 98 F (36.7 C) 98 F (36.7 C) 98.2 F (36.8 C)  TempSrc: Axillary Axillary Axillary Axillary  SpO2: 95% 95% 94% 94%  Weight:   113.8 kg (250 lb 12.8 oz)   Height:        Intake/Output Summary (Last 24 hours) at 09/08/2017 1028 Last data filed at 09/08/2017 0300 Gross per 24 hour  Intake 0 ml  Output 425 ml  Net -425 ml   Filed Weights   09/06/17 0415 09/07/17 0500 09/08/17 0300  Weight: 113.4 kg (249 lb 14.4 oz) 114.6 kg (252 lb 10.4 oz) 113.8 kg (250 lb 12.8 oz)    Examination:  General exam: Appears restless. Respiratory system: Clear to auscultation. Respiratory effort normal. Cardiovascular system: S1 & S2 heard, RRR. No JVD, murmurs, rubs, gallops or clicks. No pedal edema. Gastrointestinal system: Abdomen is nondistended, soft and nontender. No organomegaly or masses felt. Normal bowel sounds heard. Central nervous system: Alert and oriented. No focal neurological deficits. Extremities: Symmetric 5 x 5 power. Skin: No rashes, lesions or ulcer   Data Reviewed: I have personally reviewed following labs and imaging studies  CBC: Recent Labs  Lab 09/04/17 0210 09/05/17 0158 09/06/17 0651 09/07/17 0302 09/08/17 0208  WBC 10.9* 11.2* 10.7* 11.3* 13.0*  NEUTROABS 7.7 7.8* 5.9 7.7 7.6  HGB 7.5* 8.8* 8.2* 8.8* 8.0*  HCT 26.2* 30.0* 27.9* 29.7* 28.0*  MCV 72.6* 71.9* 71.0*  71.4* 72.4*  PLT 263 280 286 367 391   Basic Metabolic Panel: Recent Labs  Lab 09/05/17 1553 09/05/17 2254 09/06/17 0651 09/06/17 1717 09/07/17 0302 09/08/17 0208  NA 149* 150* 148* 147* 147* 147*  K 2.3* 2.4* 3.0* 3.1* 3.1* 3.0*  CL 109 109 107 107 105 105  CO2 26 28 29 28 30 30   GLUCOSE 173* 171* 233* 181* 252* 164*  BUN 7 6 6  5* <5* <5*  CREATININE 1.48* 1.49* 1.40* 1.30* 1.31* 1.24  CALCIUM 7.4* 7.7* 7.8* 7.7* 8.0* 7.7*  MG 1.5* 1.9 1.7 1.6* 1.9  --   PHOS 1.2* 1.3* 2.2* 1.7* 4.1  --    GFR: Estimated Creatinine Clearance: 80.8 mL/min (by C-G formula based on SCr of 1.24 mg/dL). Liver Function Tests: No results for input(s): AST, ALT, ALKPHOS, BILITOT, PROT, ALBUMIN in the last 168 hours. No results for input(s): LIPASE, AMYLASE in the last 168 hours. No results for input(s): AMMONIA in the last 168 hours. Coagulation Profile: No results for input(s): INR, PROTIME in the last 168 hours. Cardiac Enzymes: Recent Labs  Lab 09/05/17 2254  CKTOTAL 28*   BNP (last 3 results) No results for input(s): PROBNP in the last 8760 hours. HbA1C: No results for input(s): HGBA1C in the last 72 hours. CBG: Recent Labs  Lab 09/07/17 0749 09/07/17 1136 09/07/17 1656 09/07/17 2126  09/08/17 0821  GLUCAP 231* 152* 167* 120* 171*   Lipid Profile: No results for input(s): CHOL, HDL, LDLCALC, TRIG, CHOLHDL, LDLDIRECT in the last 72 hours. Thyroid Function Tests: No results for input(s): TSH, T4TOTAL, FREET4, T3FREE, THYROIDAB in the last 72 hours. Anemia Panel: No results for input(s): VITAMINB12, FOLATE, FERRITIN, TIBC, IRON, RETICCTPCT in the last 72 hours. Sepsis Labs: Recent Labs  Lab 09/02/17 1138 09/03/17 0239  LATICACIDVEN 1.8 1.4    Recent Results (from the past 240 hour(s))  Culture, blood (Routine x 2)     Status: Abnormal   Collection Time: 09/01/17  2:37 AM  Result Value Ref Range Status   Specimen Description BLOOD SITE NOT SPECIFIED  Final   Special  Requests BOTTLES DRAWN AEROBIC AND ANAEROBIC  Final   Culture  Setup Time   Final    GRAM NEGATIVE RODS IN BOTH AEROBIC AND ANAEROBIC BOTTLES CRITICAL RESULT CALLED TO, READ BACK BY AND VERIFIED WITH: L BAJBUS PHARMD 2230 09/01/17 A BROWNING    Culture (A)  Final    ESCHERICHIA COLI SUSCEPTIBILITIES PERFORMED ON PREVIOUS CULTURE WITHIN THE LAST 5 DAYS. Performed at [facility] [place] [facility], [address] [place], [place] [address]    Report Status 09/03/2017 FINAL  Final  Urine culture     Status: Abnormal   Collection Time: 09/01/17  2:37 AM  Result Value Ref Range Status   Specimen Description URINE, CATHETERIZED  Final   Special Requests NONE  Final   Culture (A)  Final    >=100,000 COLONIES/mL ESCHERICHIA COLI Confirmed Extended Spectrum Beta-Lactamase Producer (ESBL).  In bloodstream infections from ESBL organisms, carbapenems are preferred over piperacillin/tazobactam. They are shown to have a lower risk of mortality. Performed at [facility], [address] [place], [place] [address]    Report Status 09/03/2017 FINAL  Final   Organism ID, Bacteria ESCHERICHIA COLI (A)  Final      Susceptibility   Escherichia coli - MIC*    AMPICILLIN >=32 RESISTANT Resistant     CEFAZOLIN >=64 RESISTANT Resistant     CEFTRIAXONE >=64 RESISTANT Resistant     CIPROFLOXACIN >=4 RESISTANT Resistant     GENTAMICIN >=16 RESISTANT Resistant     IMIPENEM <=0.25 SENSITIVE Sensitive     NITROFURANTOIN <=16 SENSITIVE Sensitive     TRIMETH/SULFA <=20 SENSITIVE Sensitive     AMPICILLIN/SULBACTAM >=32 RESISTANT Resistant     PIP/TAZO 8 SENSITIVE Sensitive     Extended ESBL POSITIVE Resistant     * >=100,000 COLONIES/mL ESCHERICHIA COLI  Culture, blood (Routine x 2)     Status: Abnormal   Collection Time: 09/01/17  2:42 AM  Result Value Ref Range Status   Specimen Description BLOOD SITE NOT SPECIFIED  Final   Special Requests   Final    Blood Culture adequate volume BOTTLES DRAWN AEROBIC AND  ANAEROBIC   Culture  Setup Time   Final    GRAM NEGATIVE RODS IN BOTH AEROBIC AND ANAEROBIC BOTTLES CRITICAL RESULT CALLED TO, READ BACK BY AND VERIFIED WITH: L BAJBUS PHARMD 2230 09/01/17 A BROWNING    Culture (A)  Final    ESCHERICHIA COLI Confirmed Extended Spectrum Beta-Lactamase Producer (ESBL).  In bloodstream infections from ESBL organisms, carbapenems are preferred over piperacillin/tazobactam. They are shown to have a lower risk of mortality. Performed at [facility], [address] [place], [place] [address]    Report Status 09/03/2017 FINAL  Final   Organism ID, Bacteria ESCHERICHIA COLI  Final  Susceptibility   Escherichia coli - MIC*    AMPICILLIN >=32 RESISTANT Resistant     CEFAZOLIN >=64 RESISTANT Resistant     CEFEPIME RESISTANT Resistant     CEFTAZIDIME RESISTANT Resistant     CEFTRIAXONE >=64 RESISTANT Resistant     CIPROFLOXACIN >=4 RESISTANT Resistant     GENTAMICIN >=16 RESISTANT Resistant     IMIPENEM <=0.25 SENSITIVE Sensitive     TRIMETH/SULFA <=20 SENSITIVE Sensitive     AMPICILLIN/SULBACTAM >=32 RESISTANT Resistant     PIP/TAZO 8 SENSITIVE Sensitive     Extended ESBL POSITIVE Resistant     * ESCHERICHIA COLI  Blood Culture ID Panel (Reflexed)     Status: Abnormal   Collection Time: 09/01/17  2:42 AM  Result Value Ref Range Status   Enterococcus species NOT DETECTED NOT DETECTED Final   Listeria monocytogenes NOT DETECTED NOT DETECTED Final   Staphylococcus species NOT DETECTED NOT DETECTED Final   Staphylococcus aureus NOT DETECTED NOT DETECTED Final   Streptococcus species NOT DETECTED NOT DETECTED Final   Streptococcus agalactiae NOT DETECTED NOT DETECTED Final   Streptococcus pneumoniae NOT DETECTED NOT DETECTED Final   Streptococcus pyogenes NOT DETECTED NOT DETECTED Final   Acinetobacter baumannii NOT DETECTED NOT DETECTED Final   Enterobacteriaceae species DETECTED (A) NOT DETECTED Final    Comment: Enterobacteriaceae represent a  large family of gram-negative bacteria, not a single organism. CRITICAL RESULT CALLED TO, READ BACK BY AND VERIFIED WITH: L BAJBUS PHARMD 2230 09/01/17 A BROWNING    Enterobacter cloacae complex NOT DETECTED NOT DETECTED Final   Escherichia coli DETECTED (A) NOT DETECTED Final    Comment: CRITICAL RESULT CALLED TO, READ BACK BY AND VERIFIED WITH: L BAJBUS PHARMD 2230 09/01/17 A BROWNING    Klebsiella oxytoca NOT DETECTED NOT DETECTED Final   Klebsiella pneumoniae NOT DETECTED NOT DETECTED Final   Proteus species NOT DETECTED NOT DETECTED Final   Serratia marcescens NOT DETECTED NOT DETECTED Final   Carbapenem resistance NOT DETECTED NOT DETECTED Final   Haemophilus influenzae NOT DETECTED NOT DETECTED Final   Neisseria meningitidis NOT DETECTED NOT DETECTED Final   Pseudomonas aeruginosa NOT DETECTED NOT DETECTED Final   Candida albicans NOT DETECTED NOT DETECTED Final   Candida glabrata NOT DETECTED NOT DETECTED Final   Candida krusei NOT DETECTED NOT DETECTED Final   Candida parapsilosis NOT DETECTED NOT DETECTED Final   Candida tropicalis NOT DETECTED NOT DETECTED Final    Comment: Performed at [facility], [address], [place], [place] [address]  MRSA PCR Screening     Status: Abnormal   Collection Time: 09/01/17  9:48 PM  Result Value Ref Range Status   MRSA by PCR POSITIVE (A) NEGATIVE Final    Comment:        The GeneXpert MRSA Assay (FDA approved for NASAL specimens only), is one component of a comprehensive MRSA colonization surveillance program. It is not intended to diagnose MRSA infection nor to guide or monitor treatment for MRSA infections. RESULT CALLED TO, READ BACK BY AND VERIFIED WITH: J.MILLER,RN AT 0041 09/02/17 L.PITT   C difficile quick scan w PCR reflex     Status: None   Collection Time: 09/04/17 10:00 AM  Result Value Ref Range Status   C Diff antigen NEGATIVE NEGATIVE Final   C Diff toxin NEGATIVE NEGATIVE Final   C Diff interpretation No  C. difficile detected.  Final    Comment: Performed at [facility], [address] [place], [place]  [address]  Culture, blood (routine x 2)     Status: Abnormal   Collection Time: 09/04/17  2:10 PM  Result Value Ref Range Status   Specimen Description BLOOD RIGHT HAND  Final   Special Requests IN PEDIATRIC BOTTLE Blood Culture adequate volume  Final   Culture  Setup Time   Final    IN PEDIATRIC BOTTLE GRAM POSITIVE COCCI CRITICAL RESULT CALLED TO, READ BACK BY AND VERIFIED WITH: Oneta Rack AT 1652 09/05/17 BY L BENFIELD Performed at [facility], [address] [place], [place] [address]    Culture STAPHYLOCOCCUS SPECIES (COAGULASE NEGATIVE) (A)  Final   Report Status 09/07/2017 FINAL  Final   Organism ID, Bacteria STAPHYLOCOCCUS SPECIES (COAGULASE NEGATIVE)  Final      Susceptibility   Staphylococcus species (coagulase negative) - MIC*    CIPROFLOXACIN >=8 RESISTANT Resistant     ERYTHROMYCIN 4 INTERMEDIATE Intermediate     GENTAMICIN <=0.5 SENSITIVE Sensitive     OXACILLIN >=4 RESISTANT Resistant     TETRACYCLINE <=1 SENSITIVE Sensitive     VANCOMYCIN 1 SENSITIVE Sensitive     TRIMETH/SULFA <=10 SENSITIVE Sensitive     CLINDAMYCIN RESISTANT Resistant     RIFAMPIN <=0.5 SENSITIVE Sensitive     Inducible Clindamycin POSITIVE Resistant     * STAPHYLOCOCCUS SPECIES (COAGULASE NEGATIVE)  Blood Culture ID Panel (Reflexed)     Status: Abnormal   Collection Time: 09/04/17  2:10 PM  Result Value Ref Range Status   Enterococcus species NOT DETECTED NOT DETECTED Final   Listeria monocytogenes NOT DETECTED NOT DETECTED Final   Staphylococcus species NOT DETECTED NOT DETECTED Final   Staphylococcus aureus NOT DETECTED NOT DETECTED Final   Streptococcus species NOT DETECTED NOT DETECTED Final   Streptococcus agalactiae NOT DETECTED NOT DETECTED Final   Streptococcus pneumoniae NOT DETECTED NOT DETECTED Final   Streptococcus pyogenes NOT DETECTED NOT DETECTED Final    Acinetobacter baumannii NOT DETECTED NOT DETECTED Final   Enterobacteriaceae species DETECTED (A) NOT DETECTED Final    Comment: Enterobacteriaceae represent a large family of gram negative bacteria, not a single organism. Refer to culture for further identification. CRITICAL RESULT CALLED TO, READ BACK BY AND VERIFIED WITH: M BELL,PHARMD AT 1652 09/05/17 BY L BENFIELD    Enterobacter cloacae complex NOT DETECTED NOT DETECTED Final   Escherichia coli NOT DETECTED NOT DETECTED Final   Klebsiella oxytoca NOT DETECTED NOT DETECTED Final   Klebsiella pneumoniae NOT DETECTED NOT DETECTED Final   Proteus species NOT DETECTED NOT DETECTED Final   Serratia marcescens NOT DETECTED NOT DETECTED Final   Carbapenem resistance NOT DETECTED NOT DETECTED Final   Haemophilus influenzae NOT DETECTED NOT DETECTED Final   Neisseria meningitidis NOT DETECTED NOT DETECTED Final   Pseudomonas aeruginosa NOT DETECTED NOT DETECTED Final   Candida albicans NOT DETECTED NOT DETECTED Final   Candida glabrata NOT DETECTED NOT DETECTED Final   Candida krusei NOT DETECTED NOT DETECTED Final   Candida parapsilosis NOT DETECTED NOT DETECTED Final   Candida tropicalis NOT DETECTED NOT DETECTED Final    Comment: Performed at [facility], [address] [place], [place] [address]  Culture, blood (routine x 2)     Status: Abnormal   Collection Time: 09/04/17  2:14 PM  Result Value Ref Range Status   Specimen Description BLOOD RIGHT THUMB  Final   Special Requests IN PEDIATRIC BOTTLE Blood Culture adequate volume  Final   Culture  Setup Time   Final  GRAM POSITIVE COCCI IN PEDIATRIC BOTTLE CRITICAL VALUE NOTED.  VALUE IS CONSISTENT WITH PREVIOUSLY REPORTED AND CALLED VALUE.    Culture (A)  Final    STAPHYLOCOCCUS SPECIES (COAGULASE NEGATIVE) SUSCEPTIBILITIES PERFORMED ON PREVIOUS CULTURE WITHIN THE LAST 5 DAYS. Performed at [facility], [address], [place], [place] [address]    Report Status  09/07/2017 FINAL  Final  Culture, blood (routine x 2)     Status: None (Preliminary result)   Collection Time: 09/06/17 11:42 AM  Result Value Ref Range Status   Specimen Description BLOOD LEFT ANTECUBITAL  Final   Special Requests   Final    IN BOTH AEROBIC AND ANAEROBIC BOTTLES Blood Culture adequate volume   Culture   Final    NO GROWTH < 24 HOURS Performed at [facility], [address] [place], [place] [address]    Report Status PENDING  Incomplete  Culture, blood (routine x 2)     Status: None (Preliminary result)   Collection Time: 09/06/17 11:42 AM  Result Value Ref Range Status   Specimen Description BLOOD BLOOD RIGHT FOREARM  Final   Special Requests   Final    IN BOTH AEROBIC AND ANAEROBIC BOTTLES Blood Culture adequate volume   Culture   Final    NO GROWTH < 24 HOURS Performed at [facility], [address] [place], [place] [address]    Report Status PENDING  Incomplete         Radiology Studies: Dg Chest Port 1 View  Result Date: 09/07/2017 CLINICAL DATA:  Question aspiration EXAM: PORTABLE CHEST 1 VIEW COMPARISON:  09/06/2017 FINDINGS: Cardiomegaly with vascular congestion. Low lung volumes. Mild interstitial prominence could reflect interstitial edema. No effusions or acute bony abnormality. IMPRESSION: Cardiomegaly with vascular congestion and suspected mild interstitial edema. Low lung volumes. Electronically Signed   By: Charlett Nose M.D.   On: 09/07/2017 01:50   Dg Chest Port 1 View  Result Date: 09/06/2017 CLINICAL DATA:  History of CHF.  Lethargy. EXAM: PORTABLE CHEST 1 VIEW COMPARISON:  None. FINDINGS: Stable cardiomegaly with normal pulmonary vascularity. No focal infiltrate. Interval clearing of pulmonary interstitial prominence. Low lung volumes. No pleural effusion or pneumothorax. IMPRESSION: Stable cardiomegaly with normal pulmonary vascularity. Interval clearing of pulmonary interstitial edema. Low lung volumes. Electronically Signed    By: Maisie Fus  Register   On: 09/06/2017 11:55        Scheduled Meds: . calcium carbonate  1 tablet Oral TID WC  . diltiazem  120 mg Oral Q12H  . feeding supplement (PRO-STAT SUGAR FREE 64)  30 mL Oral TID BM  . insulin aspart  0-9 Units Subcutaneous TID WC  . multivitamin with minerals  1 tablet Oral Daily  . rivaroxaban  20 mg Oral Daily   Continuous Infusions: . 0.9 % NaCl with KCl 40 mEq / L    . diltiazem (CARDIZEM) infusion Stopped (09/08/17 0941)  . meropenem (MERREM) IV Stopped (09/08/17 0545)  . vancomycin Stopped (09/08/17 9147)     LOS: 7 days      Alwyn Ren, MD [facility] If 7PM-7AM, please contact night-coverage [email or web address] Password [facility] 09/08/2017, 10:28 AM

## 2017-09-08 NOTE — Progress Notes (Signed)
CSW following patient for support and discharge needs.   Marrianne Mood, MSW,  [employer] [phone]

## 2017-09-08 NOTE — Progress Notes (Signed)
Pt more awake and alert this afternoon, still not following commands, but restless and moaning and thrashing around in the bed, x2 IVs were pulled out due to increased restlessness and pt getting them all tangled up, PRN haldol given. Will monitor.

## 2017-09-08 NOTE — Progress Notes (Signed)
Pt minimally responsive this AM, unable to stay awake long enough to swallow pills. MD made aware.

## 2017-09-08 NOTE — Progress Notes (Signed)
2c06 - pt Hr dropping into the 50s - previously 70-80s - still irregular - IV Cardizem stopped, primary MD made aware.

## 2017-09-08 NOTE — Progress Notes (Signed)
SLP Cancellation Note  Patient Details Name: [name] MRN: [id number] DOB: 04-14-1951   Cancelled treatment:        Not alert enough to attempt food/liquid. Will follow along.    Royce Macadamia 09/08/2017, 11:59 AM

## 2017-09-08 NOTE — Consult Note (Signed)
Consultation Note Date: 09/08/2017   Patient Name: [name]  DOB: 06-17-1951  MRN: [id number]  Age / Sex: 67 y.o., male  PCP: [name], MD (Inactive) Referring Physician: Alwyn Ren, MD  Reason for Consultation: Establishing goals of care  HPI/Patient Profile: 67 y.o. male  with past medical history of hypertension, hyperlipidemia, diabetes mellitus, atrial fibrillation on Xarelto, OSA not on CPAP, GI bleeding, dCHF, COPD, CKD3, s/p of right AKA admitted on 09/01/2017 with fever and AMS r/t UTI and bacteremia. He has continued to decline over this hospitalization with encephalopathy and unable to tolerate intake. Palliative requested for GOC.   Clinical Assessment and Goals of Care: [name] appears very ill and continues with somnolence. He was very difficult to arouse and when he did arouse he would only grunt and moan but then I heard him crying out after I left the room. He is clearly uncomfortable and/or anxious but unable to speak to me and explain his discomfort.   After a few attempts of reaching sister, [name], I was able to speak with her over the phone. When we were finally able to connect via telephone she tells me that she is worried. I explained to her that I am worried about her brother and that he continues to decline despite aggressive care and therapies. We reviewed his recent decline especially since his Rt AKA back in September and she confirms that the past 1-2 years have been extremely difficult for him. She shares that she is upset and feels that everyone just wants to give up on him and let him die (based on an earlier conversation regarding code status). I further clarified that this is not what we want but we are concerned that we cannot make him better and want to make sure that the interventions we provide are to benefit him but I am afraid that resuscitation  would just cause further suffering to him. [name] is tearful but understands. She is tearful and shares that she does not want her brother to suffer. She does confirm that he has mentioned to her since he has been sick that he "just wants to die." She knows he has been suffering. [name] will be here tomorrow and my partners will try and meet with her ~2 pm.   Primary Decision Maker NEXT OF KIN sister [name]    SUMMARY OF RECOMMENDATIONS   - DNR - Further conversations with sister tomorrow ~2 pm  Code Status/Advance Care Planning:  DNR   Symptom Management:   Agitation: Recommend prn haldol low dose. Could also consider low dose IV Zyprexa qhs.   Consider pain as source of agitation. Recommend low dose fentanyl 25 mcg prn. Other options limited d/t NPO status.   Palliative Prophylaxis:   Aspiration, Bowel Regimen, Delirium Protocol, Frequent Pain Assessment, Oral Care and Turn Reposition  Additional Recommendations (Limitations, Scope, Preferences):  Full Scope Treatment outside DNR - cont conversations  Psycho-social/Spiritual:   Desire for further Chaplaincy support:yes  Additional Recommendations: Education on  Hospice and Grief/Bereavement Support  Prognosis:   Very poor. Eligible for hospice facility if goals are for comfort.   Discharge Planning: To Be Determined      Primary Diagnoses: Present on Admission: . Sepsis, unspecified organism (HCC) . Pressure ulcer . Obesity, Class III, BMI 40-49.9 (morbid obesity) (HCC) . Hyperlipidemia . CKD (chronic kidney disease) stage 3, GFR 30-59 ml/min (HCC) . Chronic diastolic congestive heart failure (HCC) . Atrial fibrillation with RVR (HCC) . ANEMIA . UTI (urinary tract infection) . Acute metabolic encephalopathy . HYPOKALEMIA   I have reviewed the medical record, interviewed the patient and family, and examined the patient. The following aspects are pertinent.  Past Medical History:  Diagnosis Date  .  Arthritis    "hands and lower back" (09/19/2014)  . CAD (coronary artery disease)    a. s/p PCI to RCA in 2012. b. prior cath in 01/2014 with elevated L/RH pressures, mild-mod CAD of LAD/LCx with patent RCA, normal EF, c/b CIN/CHF.  [name] Carpal tunnel syndrome, bilateral   . Cellulitis   . Chronic diastolic CHF (congestive heart failure) (HCC)   . Chronic kidney disease (CKD), stage III (moderate) (HCC)   . Chronic lower back pain   . Diabetes (HCC)   . DKA (diabetic ketoacidoses) (HCC) 03/2017  . History of blood transfusion ~ 1954   "related to OR"  . Hyperlipidemia   . Hypertension   . Hypoxia    a. Qualified for home O2 at [place] in 09/2014.  [name] Lower GI bleed   . Microcytic anemia   . Morbid obesity (HCC)   . Neuropathy   . OSA (obstructive sleep apnea)    "I wear nasal prongs; haven't been using prongs recently" (09/19/2014)  . PAF (paroxysmal atrial fibrillation) (HCC)    TEE DCCV 09/23/2014  . Physical deconditioning   . Pilonidal cyst 1980's; 01/25/2013  . Scrotal abscess   . Type II diabetes mellitus (HCC)    Social History   Socioeconomic History  . Marital status: Single    Spouse name: None  . Number of children: Y  . Years of education: None  . Highest education level: None  Social Needs  . Financial resource strain: None  . Food insecurity - worry: None  . Food insecurity - inability: None  . Transportation needs - medical: None  . Transportation needs - non-medical: None  Occupational History  . Occupation: disabled truck Hospital doctor  Tobacco Use  . Smoking status: Former Smoker    Packs/day: 1.00    Years: 20.00    Pack years: 20.00    Types: Cigarettes    Last attempt to quit: 07/13/1983    Years since quitting: 34.1  . Smokeless tobacco: Never Used  Substance and Sexual Activity  . Alcohol use: No  . Drug use: No  . Sexual activity: Not Currently  Other Topics Concern  . None  Social History Narrative   Pt is single.   Lives with sister.   Has children.     Was adopted.   Daily cafffiene-2 cups of coffee and 2 sodas per day      Family History  Adopted: Yes  Problem Relation Age of Onset  . Other Other        PT ADOPTED   Scheduled Meds: . calcium carbonate  1 tablet Oral TID WC  . diltiazem  120 mg Oral Q12H  . feeding supplement (PRO-STAT SUGAR FREE 64)  30 mL Oral TID BM  . insulin aspart  0-9 Units Subcutaneous TID WC  . multivitamin with minerals  1 tablet Oral Daily  . rivaroxaban  20 mg Oral Daily   Continuous Infusions: . 0.9 % NaCl with KCl 40 mEq / L    . diltiazem (CARDIZEM) infusion Stopped (09/08/17 0941)  . meropenem (MERREM) IV Stopped (09/08/17 0545)  . vancomycin Stopped (09/08/17 4680)   PRN Meds:.acetaminophen **OR** acetaminophen, haloperidol lactate, loperamide, ondansetron **OR** ondansetron (ZOFRAN) IV No Known Allergies Review of Systems  Unable to perform ROS: Acuity of condition    Physical Exam  Constitutional: He has a sickly appearance.  Obese, pale in appearance  Cardiovascular: Normal rate. An irregularly irregular rhythm present.  Pulmonary/Chest: No accessory muscle usage. No tachypnea. No respiratory distress. He has decreased breath sounds.  Shallow breathes  Abdominal: Soft. Normal appearance.  Neurological: He is disoriented.  Somnolent, moaning and crying out but not following commands  Nursing note and vitals reviewed.   Vital Signs: BP 128/77 (BP Location: Right Arm)   Pulse 78   Temp 98.2 F (36.8 C) (Axillary)   Resp (!) 21   Ht 6\' 3"  (1.905 m)   Wt 113.8 kg (250 lb 12.8 oz)   SpO2 94%   BMI 31.35 kg/m  Pain Assessment: PAINAD POSS *See Group Information*: 1-Acceptable,Awake and alert Pain Score: Asleep   SpO2: SpO2: 94 % O2 Device:SpO2: 94 % O2 Flow Rate: .O2 Flow Rate (L/min): 1 L/min  IO: Intake/output summary:   Intake/Output Summary (Last 24 hours) at 09/08/2017 1013 Last data filed at 09/08/2017 0300 Gross per 24 hour  Intake 0 ml  Output 425 ml  Net  -425 ml    LBM: Last BM Date: 09/08/17 Baseline Weight: Weight: 127 kg (280 lb) Most recent weight: Weight: 113.8 kg (250 lb 12.8 oz)     Palliative Assessment/Data: 20%     Time Total: 75 min  Greater than 50%  of this time was spent counseling and coordinating care related to the above assessment and plan.  Signed by: Yong Channel, NP Palliative Medicine Team Pager # [phone] (M-F 8a-5p) Team Phone # [phone] (Nights/Weekends)

## 2017-09-09 ENCOUNTER — Inpatient Hospital Stay (HOSPITAL_COMMUNITY): Payer: Medicare Other

## 2017-09-09 DIAGNOSIS — Z515 Encounter for palliative care: Secondary | ICD-10-CM

## 2017-09-09 DIAGNOSIS — G9340 Encephalopathy, unspecified: Secondary | ICD-10-CM

## 2017-09-09 DIAGNOSIS — N3000 Acute cystitis without hematuria: Secondary | ICD-10-CM

## 2017-09-09 LAB — CBC WITH DIFFERENTIAL/PLATELET
BASOS ABS: 0 10*3/uL (ref 0.0–0.1)
BASOS PCT: 0 %
EOS PCT: 14 %
Eosinophils Absolute: 1.5 10*3/uL — ABNORMAL HIGH (ref 0.0–0.7)
HEMATOCRIT: 28.5 % — AB (ref 39.0–52.0)
Hemoglobin: 8.2 g/dL — ABNORMAL LOW (ref 13.0–17.0)
LYMPHS ABS: 3.2 10*3/uL (ref 0.7–4.0)
LYMPHS PCT: 30 %
MCH: 20.9 pg — ABNORMAL LOW (ref 26.0–34.0)
MCHC: 28.8 g/dL — AB (ref 30.0–36.0)
MCV: 72.7 fL — AB (ref 78.0–100.0)
MONOS PCT: 6 %
Monocytes Absolute: 0.6 10*3/uL (ref 0.1–1.0)
NEUTROS ABS: 5.2 10*3/uL (ref 1.7–7.7)
Neutrophils Relative %: 50 %
Platelets: 401 10*3/uL — ABNORMAL HIGH (ref 150–400)
RBC: 3.92 MIL/uL — ABNORMAL LOW (ref 4.22–5.81)
RDW: 25.4 % — AB (ref 11.5–15.5)
WBC: 10.5 10*3/uL (ref 4.0–10.5)

## 2017-09-09 LAB — GLUCOSE, CAPILLARY
GLUCOSE-CAPILLARY: 147 mg/dL — AB (ref 65–99)
Glucose-Capillary: 167 mg/dL — ABNORMAL HIGH (ref 65–99)
Glucose-Capillary: 166 mg/dL — ABNORMAL HIGH (ref 65–99)
Glucose-Capillary: 127 mg/dL — ABNORMAL HIGH (ref 65–99)
Glucose-Capillary: 167 mg/dL — ABNORMAL HIGH (ref 65–99)

## 2017-09-09 LAB — BASIC METABOLIC PANEL
ANION GAP: 10 (ref 5–15)
BUN: <5 mg/dL — ABNORMAL LOW (ref 6–20)
CALCIUM: 7.5 mg/dL — AB (ref 8.9–10.3)
CO2: 23 mmol/L (ref 22–32)
CREATININE: 1.18 mg/dL (ref 0.61–1.24)
Chloride: 115 mmol/L — ABNORMAL HIGH (ref 101–111)
GFR calc non Af Amer: >60 mL/min (ref >60)
Glucose, Bld: 160 mg/dL — ABNORMAL HIGH (ref 65–99)
Potassium: 3.9 mmol/L (ref 3.5–5.1)
SODIUM: 148 mmol/L — AB (ref 135–145)

## 2017-09-09 LAB — VANCOMYCIN, TROUGH: Vancomycin Tr: 26 ug/mL (ref 15–20)

## 2017-09-09 MED ORDER — POTASSIUM CHLORIDE 10 MEQ/100ML IV SOLN
10.0000 meq | INTRAVENOUS | Status: AC
  Administered 2017-09-09 (×3): 10 meq via INTRAVENOUS
  Filled 2017-09-09 (×3): qty 100

## 2017-09-09 MED ORDER — VANCOMYCIN HCL 10 G IV SOLR
1500.0000 mg | INTRAVENOUS | Status: DC
  Administered 2017-09-09 – 2017-09-11 (×3): 1500 mg via INTRAVENOUS
  Filled 2017-09-09 (×4): qty 1500

## 2017-09-09 NOTE — Evaluation (Signed)
Clinical/Bedside Swallow Evaluation Patient Details  Name: [name] MRN: [id number] Date of Birth: 1951/07/05  Today's Date: 09/09/2017 Time: SLP Start Time (ACUTE ONLY): 0915 SLP Stop Time (ACUTE ONLY): 0950 SLP Time Calculation (min) (ACUTE ONLY): 35 min  Past Medical History:  Past Medical History:  Diagnosis Date  . Arthritis    "hands and lower back" (09/19/2014)  . CAD (coronary artery disease)    a. s/p PCI to RCA in 2012. b. prior cath in 01/2014 with elevated L/RH pressures, mild-mod CAD of LAD/LCx with patent RCA, normal EF, c/b CIN/CHF.  [name] Carpal tunnel syndrome, bilateral   . Cellulitis   . Chronic diastolic CHF (congestive heart failure) (HCC)   . Chronic kidney disease (CKD), stage III (moderate) (HCC)   . Chronic lower back pain   . Diabetes (HCC)   . DKA (diabetic ketoacidoses) (HCC) 03/2017  . History of blood transfusion ~ 1954   "related to OR"  . Hyperlipidemia   . Hypertension   . Hypoxia    a. Qualified for home O2 at [facility] in 09/2014.  [name] Lower GI bleed   . Microcytic anemia   . Morbid obesity (HCC)   . Neuropathy   . OSA (obstructive sleep apnea)    "I wear nasal prongs; haven't been using prongs recently" (09/19/2014)  . PAF (paroxysmal atrial fibrillation) (HCC)    TEE DCCV 09/23/2014  . Physical deconditioning   . Pilonidal cyst 1980's; 01/25/2013  . Scrotal abscess   . Type II diabetes mellitus (HCC)    Past Surgical History:  Past Surgical History:  Procedure Laterality Date  . ABDOMINAL SURGERY  ~ 1954   BENIGN TUMOR REMOVED  . AMPUTATION Right 03/23/2017   Procedure: RIGHT BELOW KNEE AMPUTATION;  Surgeon: Nadara Mustard, MD;  Location: [facility] OR;  Service: Orthopedics;  Laterality: Right;  . APPENDECTOMY    . BELOW KNEE LEG AMPUTATION Right 03/23/2017  . CARDIOVERSION  2010   [name] 09/19/2014  . CARDIOVERSION N/A 09/23/2014   Procedure: CARDIOVERSION;  Surgeon: Lewayne Bunting, MD;  Location: [facility] ENDOSCOPY;  Service: Cardiovascular;   Laterality: N/A;  . CARDIOVERSION N/A 12/11/2014   Procedure: CARDIOVERSION;  Surgeon: Thurmon Fair, MD;  Location: [facility] ENDOSCOPY;  Service: Cardiovascular;  Laterality: N/A;  . CATARACT EXTRACTION W/PHACO Right 11/15/2012   Procedure: CATARACT EXTRACTION PHACO AND INTRAOCULAR LENS PLACEMENT (IOC);  Surgeon: Shade Flood, MD;  Location: [facility] OR;  Service: Ophthalmology;  Laterality: Right;  . CATARACT EXTRACTION W/PHACO Left 11/29/2012   Procedure: CATARACT EXTRACTION PHACO AND INTRAOCULAR LENS PLACEMENT (IOC);  Surgeon: Shade Flood, MD;  Location: [facility] OR;  Service: Ophthalmology;  Laterality: Left;  . CORONARY ANGIOPLASTY WITH STENT PLACEMENT  August 2012   RCA DES - [facility]  . DEBRIDEMENT  FOOT Left    debriding diabetic foot ulcers  . EYE SURGERY    . FOREIGN BODY REMOVAL Right 2014   heel,  splinter removal   . LEFT AND RIGHT HEART CATHETERIZATION WITH CORONARY ANGIOGRAM N/A 01/31/2014   Procedure: LEFT AND RIGHT HEART CATHETERIZATION WITH CORONARY ANGIOGRAM;  Surgeon: Micheline Chapman, MD;  Location[facility] C[facility] LAB;  Service: Cardiovascular;  Laterality: N/A;  . PARS PLANA VITRECTOMY Left 06/05/2013   Procedure: PARS PLANA VITRECTOMY WITH 23 GAUGE with Endolaser(constellation);  Surgeon: Shade Flood, MD;  Location: [facility] OR;  Service: Ophthalmology;  Laterality: Left;  . PILONIDAL CYST EXCISION N/A 01/08/2013   Procedure: CYST EXCISION PILONIDAL EXTENSIVE;  Surgeon: Axel Filler, MD;  Location: [facility] OR;  Service: General;  Laterality: N/A;  . PILONIDAL CYST EXCISION  1980's   "in [place]"  . TEE WITHOUT CARDIOVERSION N/A 09/23/2014   Procedure: TRANSESOPHAGEAL ECHOCARDIOGRAM (TEE);  Surgeon: Lewayne Bunting, MD;  Location: [facility] ENDOSCOPY;  Service: Cardiovascular;  Laterality: N/A;  . TONSILLECTOMY     HPI:  67 year old male admitted 09/01/17 from SNF with fever and AMS. PMH significant for hypertension, hyperlipidemia, diabetes mellitus, atrial fibrillation on  Xarelto, OSA not on CPAP, GI bleeding, dCHF, COPD, CKD-3, s/p of right AKA. BSE requested to determine safe po.   Assessment / Plan / Recommendation Clinical Impression  Oral care completed with suction, which pt did not like. Oral cavity was noted to be very dry due to open mouth breathing. Following oral care, pt was presented with boluses of thin liquid and puree. Intermittent throat clear noted x2 with large consecutive boluses of thin liquids, but no overt s/s aspiration or change in stats observed with small sips of thin liquid or puree. Solids not attempted at this time, due to concern for safety with solids requiring mastication. RN was present and provided capsules in puree, which pt pocketed orally.  Contents of capsules were mixed with puree, and pt tolerated this well.   At this time, recommend puree diet and thin liquids with strict adherence to safe swallow precautions, which are posted at [facility]. It is anticipated that pt may advance to solid consistencies with increased alertness and ability to follow directions. ST will continue to follow acutely to assess diet tolerance, provide education, and try advanced solid consistencies as appropriate. RN informed of results and recommendations.     SLP Visit Diagnosis: Dysphagia, unspecified (R13.10)    Aspiration Risk  Mild aspiration risk    Diet Recommendation Dysphagia 1 (Puree);Thin liquid   Liquid Administration via: Straw Medication Administration: Crushed with puree Supervision: Full supervision/cueing for compensatory strategies Compensations: Minimize environmental distractions;Slow rate;Small sips/bites;Other (Comment)(check for oral clearing prior to providing nexxt bolus. Suction after meals to remove pocketed material or oral residue) Postural Changes: Seated upright at 90 degrees;Remain upright for at least 30 minutes after po intake    Other  Recommendations Oral Care Recommendations: Oral care before and after  PO;Staff/trained caregiver to provide oral care   Follow up Recommendations 24 hour supervision/assistance;Skilled Nursing facility      Frequency and Duration min 1 x/week  2 weeks;1 week       Prognosis Prognosis for Safe Diet Advancement: Fair Barriers to Reach Goals: Cognitive deficits      Swallow Study   General Date of Onset: 09/01/17 HPI: 67 year old male admitted 09/01/17 from SNF with fever and AMS. PMH significant for hypertension, hyperlipidemia, diabetes mellitus, atrial fibrillation on Xarelto, OSA not on CPAP, GI bleeding, dCHF, COPD, CKD-3, s/p of right AKA. BSE requested to determine safe po. Type of Study: Bedside Swallow Evaluation Previous Swallow Assessment: no prior ST intervention Diet Prior to this Study: (full liquid) Temperature Spikes Noted: No Respiratory Status: Room air History of Recent Intubation: No Behavior/Cognition: Lethargic/Drowsy;Doesn't follow directions(Restless) Oral Cavity Assessment: Dry Oral Care Completed by SLP: Yes Oral Cavity - Dentition: Adequate natural dentition Self-Feeding Abilities: Total assist Patient Positioning: Upright in bed Baseline Vocal Quality: Low vocal intensity Volitional Cough: Cognitively unable to elicit Volitional Swallow: Unable to elicit    Oral/Motor/Sensory Function Overall Oral Motor/Sensory Function: (difficult to assess given mentation)   Ice Chips Ice chips: Not tested   Thin Liquid Thin Liquid: Within  functional limits Presentation: Straw    Nectar Thick Nectar Thick Liquid: Not tested   Honey Thick Honey Thick Liquid: Not tested   Puree Puree: Within functional limits Presentation: Spoon   Solid   GO   Solid: Not tested       Paul Halder B. [name] [facility], CCC-SLP Speech Language Pathologist [phone]  Leigh Aurora 09/09/2017,10:02 AM

## 2017-09-09 NOTE — Progress Notes (Signed)
PROGRESS NOTE    [name]  ZOX:[id number] DOB: [birth year] DOA: 09/01/2017 PCP: [name], MD (Inactive)  Brief Narrative: 67 year old male with past medical history of atrial fibrillation on xarelto, DM, diastolic heart failure, OSA, stage III CKD and status post right AKA admitted on the early morning of 2/21 from his nursing facility with fever and acute encephalopathy. Patient has had previous hospitalizations for acute urinary retention leading to UTI and sepsis. In the emergency room, patient found to be in sepsis secondary to UTI again, Afib with RVR. Pt was started on diltiazem ggt and admitted in the ICU for further management.  Today, pt was noted to be more awake, with improved WOB.Still yellingintermittently due to non-focal pain. Pt still with diarrhea, flexiseal draining liquid brown stool. Pt still lethargic, unable to tolerate orally, although constantly demanding soda. Palliative consulted for GOC. Tried to contact sister, no answer.  09/08/2017 patient in bed but not able to wake him up or start a conversation he opens his eyes on deep rub to his chest.  He has not had anything to eat for many days.  I have discussed this with the sister who is his POA who will be in to see the patient today.  I have also change his CODE STATUS to DO NOT RESUSCITATE after discussing with his sister.  Palliative care to see the patient today.  09/09/2017 patient resting in bed.  I called his name and asked him to open his eyes to open his eyes and looked at me.  He was unable to follow commands or communicate with me. Assessment & Plan:   Principal Problem:   UTI (urinary tract infection) Active Problems:   Hyperlipidemia   HYPOKALEMIA   Obesity, Class III, BMI 40-49.9 (morbid obesity) (HCC)   ANEMIA   CAD S/P percutaneous coronary angioplasty   Long term (current) use of anticoagulants   Chronic diastolic congestive heart failure (HCC)   CKD (chronic kidney disease) stage 3,  GFR 30-59 ml/min (HCC)   Atrial fibrillation with RVR (HCC)   Sepsis, unspecified organism (HCC)   Pressure ulcer   Acute metabolic encephalopathy   S/P AKA (above knee amputation) unilateral, right (HCC)   Acute encephalopathy   Palliative care encounter Sepsis secondary to ESBL bacteremia Last temp spike on 2/22, improving leukocytosis, BP still soft Lactic acidosis resolved, procalcitonin 1.56 BC X 2 grew ESBL, repeat BC growing gram + cocci, ordered another repeat Spoke to ID, plan to treat for about 2 weeks or less, pending on clinical course Continue meropenem,continueVancomycin empirically , pharmacy to dose Monitor closely -Patient remains encephalopathic in spite of treatment for sepsis.  He had a CT of his head at the time of admission which did not reveal any acute changES.  Will repeat a CT of his head.  UTI due to ESBL Recurrent, hx of urinary retension UC grew ESBL Foley in, due to retension May need urology work up after bacteremia clears Continue Meropenem  DIARRHEA CDIFF NEGATIVE.  She continues to have liquid stools.  Severe electrolyte imbalance Hypokalemia/hypocalcemia/Hypophosphatemia/Hypomagnesemia/hypernatremia likely ??Refeeding syndrome Replace electrolytes prn aggressively Unable to tolerate orally as pt lethargic, SLP consulted Nutritionconsulted  Acute metabolic encephalopathy Likely due to above CT head on admission negative Management as above Repeat CT head.  Acute kidney injury in the setting of CKD stage 3 Baseline Cr ~ 1.4 Improving Likely due to above Avoid nephrotoxics  Chronic Atrial fibrillation with RVR In Afib with HRin the 90s, fluctuates Likely due to  above, sepsis Continue Cardizem gtt,startto wean offand start PO diltiazem and metoprolol Chads 2 score of 5, continue xarelto  Microcytic anemia Baseline hgb ~ 8 Iron panel: Fe 33, TIBC 113, sats 29, Ferritin 1,237 FOBT negative Monitor closely as pt on  AC  Diarrhea Multiple episode, liquid stool, flexi-seal placed C.diff negative   DM type 2 A1c 9.6 on 10/18 CBG stable SSI, detemir, held home insulin regimen   CAD S/P percutaneous coronary angioplasty Chest x-ray done today showed cardiomegaly with with congestion and mild edema.  Sacral/decubitus ulcer Wound care consult  Morbid Obesity Lifestyle modification  GOC Palliative consulted      DVT prophylaxis: Xarelto  family Communication none today Disposition Plan: TBD  Consultants: Palliative care   Procedures: None Antimicrobials: Vancomycin and meropenem Subjective: Resting in bed appears to be uncomfortable and restless.   Objective: Vitals:   09/08/17 2353 09/09/17 0402 09/09/17 0821 09/09/17 0931  BP: 108/65 (!) 143/75 (!) 142/86 124/79  Pulse: 87 87 78   Resp: (!) 25 (!) 25 (!) 27   Temp: 98.1 F (36.7 C) 98.7 F (37.1 C) 97.7 F (36.5 C)   TempSrc: Axillary Axillary Oral   SpO2: 98% 100% 98%   Weight:  113.5 kg (250 lb 4.8 oz)    Height:        Intake/Output Summary (Last 24 hours) at 09/09/2017 1039 Last data filed at 09/09/2017 0900 Gross per 24 hour  Intake 3593.75 ml  Output 2200 ml  Net 1393.75 ml   Filed Weights   09/07/17 0500 09/08/17 0300 09/09/17 0402  Weight: 114.6 kg (252 lb 10.4 oz) 113.8 kg (250 lb 12.8 oz) 113.5 kg (250 lb 4.8 oz)    Examination:  General exam: Appears uncomfortable and restless.  Oral mucosa extremely dry. Respiratory system: Clear to auscultation. Respiratory effort normal. Cardiovascular system: S1 & S2 heard, RRR. No JVD, murmurs, rubs, gallops or clicks. No pedal edema. Gastrointestinal system: Abdomen is nondistended, soft and nontender. No organomegaly or masses felt. Normal bowel sounds heard. Central nervous system: Alert and oriented. No focal neurological deficits. Extremities: Right AKA Skin: No rashes, lesions or ulcers Psychiatry: Judgement and insight appear normal. Mood &  affect appropriate.     Data Reviewed: I have personally reviewed following labs and imaging studies  CBC: Recent Labs  Lab 09/05/17 0158 09/06/17 0651 09/07/17 0302 09/08/17 0208 09/09/17 0222  WBC 11.2* 10.7* 11.3* 13.0* 10.5  NEUTROABS 7.8* 5.9 7.7 7.6 5.2  HGB 8.8* 8.2* 8.8* 8.0* 8.2*  HCT 30.0* 27.9* 29.7* 28.0* 28.5*  MCV 71.9* 71.0* 71.4* 72.4* 72.7*  PLT 280 286 367 391 401*   Basic Metabolic Panel: Recent Labs  Lab 09/05/17 1553 09/05/17 2254 09/06/17 0651 09/06/17 1717 09/07/17 0302 09/08/17 0208  NA 149* 150* 148* 147* 147* 147*  K 2.3* 2.4* 3.0* 3.1* 3.1* 3.0*  CL 109 109 107 107 105 105  CO2 26 28 29 28 30 30   GLUCOSE 173* 171* 233* 181* 252* 164*  BUN 7 6 6  5* <5* <5*  CREATININE 1.48* 1.49* 1.40* 1.30* 1.31* 1.24  CALCIUM 7.4* 7.7* 7.8* 7.7* 8.0* 7.7*  MG 1.5* 1.9 1.7 1.6* 1.9  --   PHOS 1.2* 1.3* 2.2* 1.7* 4.1  --    GFR: Estimated Creatinine Clearance: 80.7 mL/min (by C-G formula based on SCr of 1.24 mg/dL). Liver Function Tests: No results for input(s): AST, ALT, ALKPHOS, BILITOT, PROT, ALBUMIN in the last 168 hours. No results for input(s): LIPASE, AMYLASE in the last  168 hours. Recent Labs  Lab 09/08/17 1217  AMMONIA 28   Coagulation Profile: No results for input(s): INR, PROTIME in the last 168 hours. Cardiac Enzymes: Recent Labs  Lab 09/05/17 2254  CKTOTAL 28*   BNP (last 3 results) No results for input(s): PROBNP in the last 8760 hours. HbA1C: No results for input(s): HGBA1C in the last 72 hours. CBG: Recent Labs  Lab 09/08/17 1149 09/08/17 1705 09/08/17 2109 09/09/17 0407 09/09/17 0813  GLUCAP 119* 162* 114* 167* 166*   Lipid Profile: No results for input(s): CHOL, HDL, LDLCALC, TRIG, CHOLHDL, LDLDIRECT in the last 72 hours. Thyroid Function Tests: No results for input(s): TSH, T4TOTAL, FREET4, T3FREE, THYROIDAB in the last 72 hours. Anemia Panel: No results for input(s): VITAMINB12, FOLATE, FERRITIN, TIBC, IRON,  RETICCTPCT in the last 72 hours. Sepsis Labs: Recent Labs  Lab 09/02/17 1138 09/03/17 0239  LATICACIDVEN 1.8 1.4    Recent Results (from the past 240 hour(s))  Culture, blood (Routine x 2)     Status: Abnormal   Collection Time: 09/01/17  2:37 AM  Result Value Ref Range Status   Specimen Description BLOOD SITE NOT SPECIFIED  Final   Special Requests BOTTLES DRAWN AEROBIC AND ANAEROBIC  Final   Culture  Setup Time   Final    GRAM NEGATIVE RODS IN BOTH AEROBIC AND ANAEROBIC BOTTLES CRITICAL RESULT CALLED TO, READ BACK BY AND VERIFIED WITH: L BAJBUS PHARMD 2230 09/01/17 A BROWNING    Culture (A)  Final    ESCHERICHIA COLI SUSCEPTIBILITIES PERFORMED ON PREVIOUS CULTURE WITHIN THE LAST 5 DAYS. Performed at [facility], [address] [place], [place] [address]    Report Status 09/03/2017 FINAL  Final  Urine culture     Status: Abnormal   Collection Time: 09/01/17  2:37 AM  Result Value Ref Range Status   Specimen Description URINE, CATHETERIZED  Final   Special Requests NONE  Final   Culture (A)  Final    >=100,000 COLONIES/mL ESCHERICHIA COLI Confirmed Extended Spectrum Beta-Lactamase Producer (ESBL).  In bloodstream infections from ESBL organisms, carbapenems are preferred over piperacillin/tazobactam. They are shown to have a lower risk of mortality. Performed at [facility], [address], [place], [place] [address]    Report Status 09/03/2017 FINAL  Final   Organism ID, Bacteria ESCHERICHIA COLI (A)  Final      Susceptibility   Escherichia coli - MIC*    AMPICILLIN >=32 RESISTANT Resistant     CEFAZOLIN >=64 RESISTANT Resistant     CEFTRIAXONE >=64 RESISTANT Resistant     CIPROFLOXACIN >=4 RESISTANT Resistant     GENTAMICIN >=16 RESISTANT Resistant     IMIPENEM <=0.25 SENSITIVE Sensitive     NITROFURANTOIN <=16 SENSITIVE Sensitive     TRIMETH/SULFA <=20 SENSITIVE Sensitive     AMPICILLIN/SULBACTAM >=32 RESISTANT Resistant     PIP/TAZO 8 SENSITIVE  Sensitive     Extended ESBL POSITIVE Resistant     * >=100,000 COLONIES/mL ESCHERICHIA COLI  Culture, blood (Routine x 2)     Status: Abnormal   Collection Time: 09/01/17  2:42 AM  Result Value Ref Range Status   Specimen Description BLOOD SITE NOT SPECIFIED  Final   Special Requests   Final    Blood Culture adequate volume BOTTLES DRAWN AEROBIC AND ANAEROBIC   Culture  Setup Time   Final    GRAM NEGATIVE RODS IN BOTH AEROBIC AND ANAEROBIC BOTTLES CRITICAL RESULT CALLED TO, READ BACK BY AND VERIFIED WITH: L  BAJBUS PHARMD 2230 09/01/17 A BROWNING    Culture (A)  Final    ESCHERICHIA COLI Confirmed Extended Spectrum Beta-Lactamase Producer (ESBL).  In bloodstream infections from ESBL organisms, carbapenems are preferred over piperacillin/tazobactam. They are shown to have a lower risk of mortality. Performed at [facility], [address] [place], [place] [address]    Report Status 09/03/2017 FINAL  Final   Organism ID, Bacteria ESCHERICHIA COLI  Final      Susceptibility   Escherichia coli - MIC*    AMPICILLIN >=32 RESISTANT Resistant     CEFAZOLIN >=64 RESISTANT Resistant     CEFEPIME RESISTANT Resistant     CEFTAZIDIME RESISTANT Resistant     CEFTRIAXONE >=64 RESISTANT Resistant     CIPROFLOXACIN >=4 RESISTANT Resistant     GENTAMICIN >=16 RESISTANT Resistant     IMIPENEM <=0.25 SENSITIVE Sensitive     TRIMETH/SULFA <=20 SENSITIVE Sensitive     AMPICILLIN/SULBACTAM >=32 RESISTANT Resistant     PIP/TAZO 8 SENSITIVE Sensitive     Extended ESBL POSITIVE Resistant     * ESCHERICHIA COLI  Blood Culture ID Panel (Reflexed)     Status: Abnormal   Collection Time: 09/01/17  2:42 AM  Result Value Ref Range Status   Enterococcus species NOT DETECTED NOT DETECTED Final   Listeria monocytogenes NOT DETECTED NOT DETECTED Final   Staphylococcus species NOT DETECTED NOT DETECTED Final   Staphylococcus aureus NOT DETECTED NOT DETECTED Final   Streptococcus species NOT DETECTED NOT  DETECTED Final   Streptococcus agalactiae NOT DETECTED NOT DETECTED Final   Streptococcus pneumoniae NOT DETECTED NOT DETECTED Final   Streptococcus pyogenes NOT DETECTED NOT DETECTED Final   Acinetobacter baumannii NOT DETECTED NOT DETECTED Final   Enterobacteriaceae species DETECTED (A) NOT DETECTED Final    Comment: Enterobacteriaceae represent a large family of gram-negative bacteria, not a single organism. CRITICAL RESULT CALLED TO, READ BACK BY AND VERIFIED WITH: L BAJBUS PHARMD 2230 09/01/17 A BROWNING    Enterobacter cloacae complex NOT DETECTED NOT DETECTED Final   Escherichia coli DETECTED (A) NOT DETECTED Final    Comment: CRITICAL RESULT CALLED TO, READ BACK BY AND VERIFIED WITH: L BAJBUS PHARMD 2230 09/01/17 A BROWNING    Klebsiella oxytoca NOT DETECTED NOT DETECTED Final   Klebsiella pneumoniae NOT DETECTED NOT DETECTED Final   Proteus species NOT DETECTED NOT DETECTED Final   Serratia marcescens NOT DETECTED NOT DETECTED Final   Carbapenem resistance NOT DETECTED NOT DETECTED Final   Haemophilus influenzae NOT DETECTED NOT DETECTED Final   Neisseria meningitidis NOT DETECTED NOT DETECTED Final   Pseudomonas aeruginosa NOT DETECTED NOT DETECTED Final   Candida albicans NOT DETECTED NOT DETECTED Final   Candida glabrata NOT DETECTED NOT DETECTED Final   Candida krusei NOT DETECTED NOT DETECTED Final   Candida parapsilosis NOT DETECTED NOT DETECTED Final   Candida tropicalis NOT DETECTED NOT DETECTED Final    Comment: Performed at [facility], [address], [place], [place] [address]  MRSA PCR Screening     Status: Abnormal   Collection Time: 09/01/17  9:48 PM  Result Value Ref Range Status   MRSA by PCR POSITIVE (A) NEGATIVE Final    Comment:        The GeneXpert MRSA Assay (FDA approved for NASAL specimens only), is one component of a comprehensive MRSA colonization surveillance program. It is not intended to diagnose MRSA infection nor to guide  or monitor treatment for MRSA infections. RESULT CALLED TO,  READ BACK BY AND VERIFIED WITH: J.MILLER,RN AT 0041 09/02/17 L.PITT   C difficile quick scan w PCR reflex     Status: None   Collection Time: 09/04/17 10:00 AM  Result Value Ref Range Status   C Diff antigen NEGATIVE NEGATIVE Final   C Diff toxin NEGATIVE NEGATIVE Final   C Diff interpretation No C. difficile detected.  Final    Comment: Performed at [facility], [address], [place], [place] [address]  Culture, blood (routine x 2)     Status: Abnormal   Collection Time: 09/04/17  2:10 PM  Result Value Ref Range Status   Specimen Description BLOOD RIGHT HAND  Final   Special Requests IN PEDIATRIC BOTTLE Blood Culture adequate volume  Final   Culture  Setup Time   Final    IN PEDIATRIC BOTTLE GRAM POSITIVE COCCI CRITICAL RESULT CALLED TO, READ BACK BY AND VERIFIED WITH: Oneta Rack AT 1652 09/05/17 BY L BENFIELD Performed at [facility], [address], [place], [place] [address]    Culture STAPHYLOCOCCUS SPECIES (COAGULASE NEGATIVE) (A)  Final   Report Status 09/07/2017 FINAL  Final   Organism ID, Bacteria STAPHYLOCOCCUS SPECIES (COAGULASE NEGATIVE)  Final      Susceptibility   Staphylococcus species (coagulase negative) - MIC*    CIPROFLOXACIN >=8 RESISTANT Resistant     ERYTHROMYCIN 4 INTERMEDIATE Intermediate     GENTAMICIN <=0.5 SENSITIVE Sensitive     OXACILLIN >=4 RESISTANT Resistant     TETRACYCLINE <=1 SENSITIVE Sensitive     VANCOMYCIN 1 SENSITIVE Sensitive     TRIMETH/SULFA <=10 SENSITIVE Sensitive     CLINDAMYCIN RESISTANT Resistant     RIFAMPIN <=0.5 SENSITIVE Sensitive     Inducible Clindamycin POSITIVE Resistant     * STAPHYLOCOCCUS SPECIES (COAGULASE NEGATIVE)  Blood Culture ID Panel (Reflexed)     Status: Abnormal   Collection Time: 09/04/17  2:10 PM  Result Value Ref Range Status   Enterococcus species NOT DETECTED NOT DETECTED Final   Listeria monocytogenes NOT DETECTED NOT  DETECTED Final   Staphylococcus species NOT DETECTED NOT DETECTED Final   Staphylococcus aureus NOT DETECTED NOT DETECTED Final   Streptococcus species NOT DETECTED NOT DETECTED Final   Streptococcus agalactiae NOT DETECTED NOT DETECTED Final   Streptococcus pneumoniae NOT DETECTED NOT DETECTED Final   Streptococcus pyogenes NOT DETECTED NOT DETECTED Final   Acinetobacter baumannii NOT DETECTED NOT DETECTED Final   Enterobacteriaceae species DETECTED (A) NOT DETECTED Final    Comment: Enterobacteriaceae represent a large family of gram negative bacteria, not a single organism. Refer to culture for further identification. CRITICAL RESULT CALLED TO, READ BACK BY AND VERIFIED WITH: M BELL,PHARMD AT 1652 09/05/17 BY L BENFIELD    Enterobacter cloacae complex NOT DETECTED NOT DETECTED Final   Escherichia coli NOT DETECTED NOT DETECTED Final   Klebsiella oxytoca NOT DETECTED NOT DETECTED Final   Klebsiella pneumoniae NOT DETECTED NOT DETECTED Final   Proteus species NOT DETECTED NOT DETECTED Final   Serratia marcescens NOT DETECTED NOT DETECTED Final   Carbapenem resistance NOT DETECTED NOT DETECTED Final   Haemophilus influenzae NOT DETECTED NOT DETECTED Final   Neisseria meningitidis NOT DETECTED NOT DETECTED Final   Pseudomonas aeruginosa NOT DETECTED NOT DETECTED Final   Candida albicans NOT DETECTED NOT DETECTED Final   Candida glabrata NOT DETECTED NOT DETECTED Final   Candida krusei NOT DETECTED NOT DETECTED Final   Candida parapsilosis NOT DETECTED NOT DETECTED Final   Candida  tropicalis NOT DETECTED NOT DETECTED Final    Comment: Performed at [facility], [address], [place], [place] [address]  Culture, blood (routine x 2)     Status: Abnormal   Collection Time: 09/04/17  2:14 PM  Result Value Ref Range Status   Specimen Description BLOOD RIGHT THUMB  Final   Special Requests IN PEDIATRIC BOTTLE Blood Culture adequate volume  Final   Culture  Setup Time   Final    GRAM  POSITIVE COCCI IN PEDIATRIC BOTTLE CRITICAL VALUE NOTED.  VALUE IS CONSISTENT WITH PREVIOUSLY REPORTED AND CALLED VALUE.    Culture (A)  Final    STAPHYLOCOCCUS SPECIES (COAGULASE NEGATIVE) SUSCEPTIBILITIES PERFORMED ON PREVIOUS CULTURE WITHIN THE LAST 5 DAYS. Performed at [facility], [address] [place], [place] [address]    Report Status 09/07/2017 FINAL  Final  Culture, blood (routine x 2)     Status: None (Preliminary result)   Collection Time: 09/06/17 11:42 AM  Result Value Ref Range Status   Specimen Description BLOOD LEFT ANTECUBITAL  Final   Special Requests   Final    IN BOTH AEROBIC AND ANAEROBIC BOTTLES Blood Culture adequate volume   Culture   Final    NO GROWTH 2 DAYS Performed at [facility], [address], [place], [place] [address]    Report Status PENDING  Incomplete  Culture, blood (routine x 2)     Status: None (Preliminary result)   Collection Time: 09/06/17 11:42 AM  Result Value Ref Range Status   Specimen Description BLOOD BLOOD RIGHT FOREARM  Final   Special Requests   Final    IN BOTH AEROBIC AND ANAEROBIC BOTTLES Blood Culture adequate volume   Culture   Final    NO GROWTH 2 DAYS Performed at [facility], [address] [place], [place] [address]    Report Status PENDING  Incomplete         Radiology Studies: No results found.      Scheduled Meds: . calcium carbonate  1 tablet Oral TID WC  . diltiazem  120 mg Oral Q12H  . feeding supplement (PRO-STAT SUGAR FREE 64)  30 mL Oral TID BM  . insulin aspart  0-9 Units Subcutaneous TID WC  . multivitamin with minerals  1 tablet Oral Daily  . rivaroxaban  20 mg Oral Daily   Continuous Infusions: . 0.9 % NaCl with KCl 40 mEq / L 125 mL/hr at 09/09/17 0900  . diltiazem (CARDIZEM) infusion Stopped (09/08/17 0941)  . meropenem (MERREM) IV 1 g (09/09/17 0524)  . vancomycin       LOS: 8 days      Alwyn Ren, MD [name]  If 7PM-7AM, please  contact night-coverage [email or web address] Password [facility] 09/09/2017, 10:39 AM

## 2017-09-09 NOTE — Progress Notes (Signed)
ANTIBIOTIC CONSULT NOTE   Pharmacy Consult for Vanco and Merrem Indication: bacteremia  No Known Allergies  Patient Measurements: Height: 6\' 3"  (190.5 cm) Weight: 250 lb 4.8 oz (113.5 kg) IBW/kg (Calculated) : 84.5 Adjusted Body Weight:    Vital Signs: Temp: 98.7 F (37.1 C) (03/01 0402) Temp Source: Axillary (03/01 0402) BP: 143/75 (03/01 0402) Pulse Rate: 87 (03/01 0402) Intake/Output from previous day: 02/28 0701 - 03/01 0700 In: 2468.8 [I.V.:1518.8; IV Piggyback:950] Out: 1700 [Urine:550; Stool:1150] Intake/Output from this shift: Total I/O In: 2468.8 [I.V.:1518.8; IV Piggyback:950] Out: 550 [Urine:550]  Labs: Recent Labs    09/06/17 1717 09/07/17 0302 09/08/17 0208 09/09/17 0222  WBC  --  11.3* 13.0* 10.5  HGB  --  8.8* 8.0* 8.2*  PLT  --  367 391 401*  CREATININE 1.30* 1.31* 1.24  --    Estimated Creatinine Clearance: 80.7 mL/min (by C-G formula based on SCr of 1.24 mg/dL). Recent Labs    09/09/17 0222  VANCOTROUGH 26*     Microbiology:   Medical History: Past Medical History:  Diagnosis Date  . Arthritis    "hands and lower back" (09/19/2014)  . CAD (coronary artery disease)    a. s/p PCI to RCA in 2012. b. prior cath in 01/2014 with elevated L/RH pressures, mild-mod CAD of LAD/LCx with patent RCA, normal EF, c/b CIN/CHF.  02/2014 Carpal tunnel syndrome, bilateral   . Cellulitis   . Chronic diastolic CHF (congestive heart failure) (HCC)   . Chronic kidney disease (CKD), stage III (moderate) (HCC)   . Chronic lower back pain   . Diabetes (HCC)   . DKA (diabetic ketoacidoses) (HCC) 03/2017  . History of blood transfusion ~ 1954   "related to OR"  . Hyperlipidemia   . Hypertension   . Hypoxia    a. Qualified for home O2 at DC in 09/2014.  10/2014 Lower GI bleed   . Microcytic anemia   . Morbid obesity (HCC)   . Neuropathy   . OSA (obstructive sleep apnea)    "I wear nasal prongs; haven't been using prongs recently" (09/19/2014)  . PAF (paroxysmal atrial  fibrillation) (HCC)    TEE DCCV 09/23/2014  . Physical deconditioning   . Pilonidal cyst 1980's; 01/25/2013  . Scrotal abscess   . Type II diabetes mellitus (HCC)     Assessment:  ID: UTI>ESBL Ecoli bacteremia. ABX changed to Merrem/Vanco for ESBL-E. Coli - WBC 10.5, Afebrile. Plan 14d abx.  - 2/25 PM: BCID with Enterobacteriaceae species was detected, but gram stain grew gram positive cocci, which was confirmed with lab. Asked lab to re-run BCID with the other set of blood culture, and this was back with nothing detected. It is hard to interpreted the BCID results.  3/1 Vancomycin trough of 26 drawn several hours early. Ke=0.033, t 1/2 21h  Will adjust vancomycin dose to 1500mg  q24 hours, new trough expected to be closer to 16. Will plan on rechecking at new steady state as indicated.   Vanco 2/25>> Meropenem 2/23 >> Ceftriaxone 2/21>>2/23 Zosyn x 1 on 2/21   2/24: BC x 2: GPC x 2>>CoNS  2/24 BCID: Enterbacter species  2/24: CDif neg  2/21 blood x 2 - ESBL-E. coli  2/21 urine - > 100 K/ml ESBL-E.coli  2/21 MRSA PCR positive  2/21 Influeza PCR negative  Goal of Therapy:  Vancomycin trough level 15-20 mcg/ml  Plan:  - Vanco 1500mg  IV q 24 hrs. Trough after 3-5 doses at steady state. - Merrem 1g IV q8h  Sheppard Coil PharmD., BCPS Clinical Pharmacist 09/09/2017 4:19 AM

## 2017-09-09 NOTE — Progress Notes (Signed)
Went to meet pt's sister for 2pm scheduled appt. She did not show. Will call her to reschedule. Pt unable to participate in conversation Eduard Roux, ANP

## 2017-09-10 LAB — GLUCOSE, CAPILLARY
GLUCOSE-CAPILLARY: 175 mg/dL — AB (ref 65–99)
GLUCOSE-CAPILLARY: 139 mg/dL — AB (ref 65–99)
Glucose-Capillary: 168 mg/dL — ABNORMAL HIGH (ref 65–99)
Glucose-Capillary: 129 mg/dL — ABNORMAL HIGH (ref 65–99)

## 2017-09-10 LAB — CBC WITH DIFFERENTIAL/PLATELET
Basophils Absolute: 0 10*3/uL (ref 0.0–0.1)
Basophils Relative: 0 %
EOS PCT: 14 %
Eosinophils Absolute: 1.6 10*3/uL — ABNORMAL HIGH (ref 0.0–0.7)
HCT: 29.1 % — ABNORMAL LOW (ref 39.0–52.0)
Hemoglobin: 8.2 g/dL — ABNORMAL LOW (ref 13.0–17.0)
Lymphocytes Relative: 29 %
Lymphs Abs: 3.4 10*3/uL (ref 0.7–4.0)
MCH: 20.8 pg — ABNORMAL LOW (ref 26.0–34.0)
MCHC: 28.2 g/dL — ABNORMAL LOW (ref 30.0–36.0)
MCV: 73.7 fL — AB (ref 78.0–100.0)
MONO ABS: 0.7 10*3/uL (ref 0.1–1.0)
Monocytes Relative: 6 %
NEUTROS PCT: 51 %
Neutro Abs: 5.9 10*3/uL (ref 1.7–7.7)
PLATELETS: 419 10*3/uL — AB (ref 150–400)
RBC: 3.95 MIL/uL — ABNORMAL LOW (ref 4.22–5.81)
RDW: 25.8 % — ABNORMAL HIGH (ref 11.5–15.5)
WBC: 11.6 10*3/uL — ABNORMAL HIGH (ref 4.0–10.5)

## 2017-09-10 LAB — BASIC METABOLIC PANEL
ANION GAP: 8 (ref 5–15)
BUN: <5 mg/dL — ABNORMAL LOW (ref 6–20)
CO2: 22 mmol/L (ref 22–32)
Calcium: 7.5 mg/dL — ABNORMAL LOW (ref 8.9–10.3)
Chloride: 118 mmol/L — ABNORMAL HIGH (ref 101–111)
Creatinine, Ser: 1.1 mg/dL (ref 0.61–1.24)
GFR calc Af Amer: >60 mL/min (ref >60)
GLUCOSE: 182 mg/dL — AB (ref 65–99)
Potassium: 4.4 mmol/L (ref 3.5–5.1)
SODIUM: 148 mmol/L — AB (ref 135–145)

## 2017-09-10 LAB — MAGNESIUM: MAGNESIUM: 1.7 mg/dL (ref 1.7–2.4)

## 2017-09-10 LAB — PHOSPHORUS: Phosphorus: 2.5 mg/dL (ref 2.5–4.6)

## 2017-09-10 MED ORDER — LOPERAMIDE HCL 2 MG PO CAPS: 2.0000 mg | ORAL_CAPSULE | Freq: Four times a day (QID) | ORAL | Status: DC | PRN

## 2017-09-10 NOTE — Progress Notes (Signed)
Pt refuses to eat dinner, pt took a few bites of applesauce with xarelto crushed and then refused to eat anything else. Mouth care done after eating

## 2017-09-10 NOTE — Progress Notes (Signed)
Sat pt upright to feed him breakfast and give him his medications, mouth done. Pt had some strong coughing with eggs and grits. Pt spit some food out. Pt did drink some coffee without coughing and was able to eat some pureed pears and pudding without difficulty or coughing.  Pt will not speak this am , he will nod head yes and no to some questions. Mouthcare done after eating and sitting upright for 30 mins after meal.

## 2017-09-10 NOTE — Progress Notes (Signed)
Pt sitting upright for lunch, oral care done. Pt took one bite of mashed potatoes and spit them out. Did more mouthcare and pt did drink some ginger ale with prostat mixed in. Pt refuses rest of lunch

## 2017-09-10 NOTE — Progress Notes (Signed)
PROGRESS NOTE    [name]  WHQ:[id number] DOB: 08/22/1950 DOA: 09/01/2017 PCP: [name], MD (Inactive)  Brief Narrative:67 year old male with past medical history of atrial fibrillation on xarelto, DM, diastolic heart failure, OSA, stage III CKD and status post right AKA admitted on the early morning of 2/21 from his nursing facility with fever and acute encephalopathy. Patient has had previous hospitalizations for acute urinary retention leading to UTI and sepsis. In the emergency room, patient found to be in sepsis secondary to UTI again, Afib with RVR. Pt was started on diltiazem ggt and admitted in the ICU for further management.  Today, pt was noted to be more awake, with improved WOB.Still yellingintermittently due to non-focal pain. Pt still with diarrhea, flexiseal draining liquid brown stool. Pt still lethargic, unable to tolerate orally, although constantly demanding soda. Palliative consulted for GOC. Tried to contact sister, no answer.  09/08/2017 patient in bed but not able to wake him up or start a conversation he opens his eyes on deep rub to his chest. He has not had anything to eat for many days. I have discussed this with the sister who is his POA who will be in to see the patient today. I have also change his CODE STATUS to DO NOT RESUSCITATE after discussing with his sister. Palliative care to see the patient today. 09/09/2017 patient resting in bed.  I called his name and asked him to open his eyes to open his eyes and looked at me.  He was unable to follow commands or communicate with me.  09/10/2017 patient resting in bed.  Eyes open.  But not able to speak to me.  And does not follow commands.  Assessment & Plan:   Principal Problem:   UTI (urinary tract infection) Active Problems:   Hyperlipidemia   HYPOKALEMIA   Obesity, Class III, BMI 40-49.9 (morbid obesity) (HCC)   ANEMIA   CAD S/P percutaneous coronary angioplasty   Long term (current) use of  anticoagulants   Chronic diastolic congestive heart failure (HCC)   CKD (chronic kidney disease) stage 3, GFR 30-59 ml/min (HCC)   Atrial fibrillation with RVR (HCC)   Sepsis, unspecified organism (HCC)   Pressure ulcer   Acute metabolic encephalopathy   S/P AKA (above knee amputation) unilateral, right (HCC)   Acute encephalopathy   Palliative care encounter  Sepsis secondary to ESBL bacteremia Last temp spike on 2/22, improving leukocytosis, BP still soft Lactic acidosis resolved, procalcitonin 1.56 BC X 2 grew ESBL, repeat BC growing gram + cocci, ordered another repeat Spoke to ID, plan to treat for about 2 weeks or less, pending on clinical course Continue meropenem,continueVancomycin empirically , pharmacy to dose.  Will repeat blood cultures x2. -Patient remains encephalopathic in spite of treatment for sepsis.  He had a CT of his head at the time of admission which did not reveal any acute changES.  Repeat CT of his head shows no acute changes.  UTI due to ESBL Recurrent, hx of urinary retension UC grew ESBL Foley in, due to retension May need urology work up after bacteremia clears Continue Meropenem  DIARRHEA CDIFF NEGATIVE.  She continues to have liquid stools.  Start as needed Imodium.  C. difficile negative. Severe electrolyte imbalance Hypokalemia/hypocalcemia/Hypophosphatemia/Hypomagnesemia/hypernatremia likely ??Refeeding syndrome Replace electrolytes prn aggressively Unable to tolerate orally as pt lethargic, SLP consult noted. Nutritionconsulted Acute metabolic encephalopathy Likely due to above CT head on admission negative Management as above  Acute kidney injury in the setting of  CKD stage 3 Baseline Cr ~ 1.4 Improving Likely due to above Avoid nephrotoxics  Chronic Atrial fibrillation with RVR In Afib with HRin the 90s, fluctuates Likely due to above, sepsis.  Cardizem drip has been stopped due to.  Some bradycardia.  Continue p.o. Cardizem.   And metoprolol. Chads 2 score of 5, continue xarelto  Microcytic anemia Baseline hgb ~ 8 Iron panel: Fe 33, TIBC 113, sats 29, Ferritin 1,237 FOBT negative Monitor closely as pt on AC  Diarrhea Multiple episode, liquid stool, flexi-seal placed C.diff negative   DM type 2 A1c 9.6 on 10/18 CBG stable SSI, detemir, held home insulin regimen   CAD S/P percutaneous coronary angioplasty Chest x-ray done today showed cardiomegaly with with congestion and mild edema   GOC Palliative consult appreciated.      DVT prophylaxis: Xarelto Code Status: DNR Family Communication none Disposition Plan TBD Consultants: Palliative care  Procedures: None Antimicrobials: Vanco and meropenem  Subjective: Resting in bed appears calmer than yesterday.  Objective: Vitals:   09/09/17 1958 09/09/17 2323 09/10/17 0423 09/10/17 0805  BP: (!) 111/55 130/63 (!) 162/69 125/83  Pulse: 89 91 93 91  Resp: (!) 26 (!) 22 (!) 25 18  Temp: 97.9 F (36.6 C) 98 F (36.7 C) 98.1 F (36.7 C) 98 F (36.7 C)  TempSrc: Oral Oral Oral Oral  SpO2: 100% 98% 99% 100%  Weight:   113.6 kg (250 lb 6.4 oz)   Height:        Intake/Output Summary (Last 24 hours) at 09/10/2017 0942 Last data filed at 09/10/2017 0810 Gross per 24 hour  Intake 3935.83 ml  Output 3300 ml  Net 635.83 ml   Filed Weights   09/08/17 0300 09/09/17 0402 09/10/17 0423  Weight: 113.8 kg (250 lb 12.8 oz) 113.5 kg (250 lb 4.8 oz) 113.6 kg (250 lb 6.4 oz)    Examination:  General exam: Appears calm and comfortable today Respiratory system: Clear to auscultation. Respiratory effort normal. Cardiovascular system: S1 & S2 heard, RRR. No JVD, murmurs, rubs, gallops or clicks. No pedal edema. Gastrointestinal system: Abdomen is nondistended, soft and nontender. No organomegaly or masses felt. Normal bowel sounds heard. Central nervous system: Alert and oriented. No focal neurological deficits. Extremities: r aka  Data  Reviewed: I have personally reviewed following labs and imaging studies  CBC: Recent Labs  Lab 09/06/17 0651 09/07/17 0302 09/08/17 0208 09/09/17 0222 09/10/17 0204  WBC 10.7* 11.3* 13.0* 10.5 11.6*  NEUTROABS 5.9 7.7 7.6 5.2 5.9  HGB 8.2* 8.8* 8.0* 8.2* 8.2*  HCT 27.9* 29.7* 28.0* 28.5* 29.1*  MCV 71.0* 71.4* 72.4* 72.7* 73.7*  PLT 286 367 391 401* 419*   Basic Metabolic Panel: Recent Labs  Lab 09/05/17 1553 09/05/17 2254 09/06/17 0651 09/06/17 1717 09/07/17 0302 09/08/17 0208 09/09/17 1218 09/10/17 0204  NA 149* 150* 148* 147* 147* 147* 148* 148*  K 2.3* 2.4* 3.0* 3.1* 3.1* 3.0* 3.9 4.4  CL 109 109 107 107 105 105 115* 118*  CO2 26 28 29 28 30 30 23 22   GLUCOSE 173* 171* 233* 181* 252* 164* 160* 182*  BUN 7 6 6  5* <5* <5* <5* <5*  CREATININE 1.48* 1.49* 1.40* 1.30* 1.31* 1.24 1.18 1.10  CALCIUM 7.4* 7.7* 7.8* 7.7* 8.0* 7.7* 7.5* 7.5*  MG 1.5* 1.9 1.7 1.6* 1.9  --   --   --   PHOS 1.2* 1.3* 2.2* 1.7* 4.1  --   --   --    GFR: Estimated Creatinine Clearance:  91 mL/min (by C-G formula based on SCr of 1.1 mg/dL). Liver Function Tests: No results for input(s): AST, ALT, ALKPHOS, BILITOT, PROT, ALBUMIN in the last 168 hours. No results for input(s): LIPASE, AMYLASE in the last 168 hours. Recent Labs  Lab 09/08/17 1217  AMMONIA 28   Coagulation Profile: No results for input(s): INR, PROTIME in the last 168 hours. Cardiac Enzymes: Recent Labs  Lab 09/05/17 2254  CKTOTAL 28*   BNP (last 3 results) No results for input(s): PROBNP in the last 8760 hours. HbA1C: No results for input(s): HGBA1C in the last 72 hours. CBG: Recent Labs  Lab 09/09/17 0813 09/09/17 1237 09/09/17 1634 09/09/17 2220 09/10/17 0802  GLUCAP 166* 147* 127* 167* 175*   Lipid Profile: No results for input(s): CHOL, HDL, LDLCALC, TRIG, CHOLHDL, LDLDIRECT in the last 72 hours. Thyroid Function Tests: No results for input(s): TSH, T4TOTAL, FREET4, T3FREE, THYROIDAB in the last 72  hours. Anemia Panel: No results for input(s): VITAMINB12, FOLATE, FERRITIN, TIBC, IRON, RETICCTPCT in the last 72 hours. Sepsis Labs: No results for input(s): PROCALCITON, LATICACIDVEN in the last 168 hours.  Recent Results (from the past 240 hour(s))  Culture, blood (Routine x 2)     Status: Abnormal   Collection Time: 09/01/17  2:37 AM  Result Value Ref Range Status   Specimen Description BLOOD SITE NOT SPECIFIED  Final   Special Requests BOTTLES DRAWN AEROBIC AND ANAEROBIC  Final   Culture  Setup Time   Final    GRAM NEGATIVE RODS IN BOTH AEROBIC AND ANAEROBIC BOTTLES CRITICAL RESULT CALLED TO, READ BACK BY AND VERIFIED WITH: L BAJBUS PHARMD 2230 09/01/17 A BROWNING    Culture (A)  Final    ESCHERICHIA COLI SUSCEPTIBILITIES PERFORMED ON PREVIOUS CULTURE WITHIN THE LAST 5 DAYS. Performed at [facility], [address] [place], [place] [address]    Report Status 09/03/2017 FINAL  Final  Urine culture     Status: Abnormal   Collection Time: 09/01/17  2:37 AM  Result Value Ref Range Status   Specimen Description URINE, CATHETERIZED  Final   Special Requests NONE  Final   Culture (A)  Final    >=100,000 COLONIES/mL ESCHERICHIA COLI Confirmed Extended Spectrum Beta-Lactamase Producer (ESBL).  In bloodstream infections from ESBL organisms, carbapenems are preferred over piperacillin/tazobactam. They are shown to have a lower risk of mortality. Performed at [facility], [address], [place], [place] [address]    Report Status 09/03/2017 FINAL  Final   Organism ID, Bacteria ESCHERICHIA COLI (A)  Final      Susceptibility   Escherichia coli - MIC*    AMPICILLIN >=32 RESISTANT Resistant     CEFAZOLIN >=64 RESISTANT Resistant     CEFTRIAXONE >=64 RESISTANT Resistant     CIPROFLOXACIN >=4 RESISTANT Resistant     GENTAMICIN >=16 RESISTANT Resistant     IMIPENEM <=0.25 SENSITIVE Sensitive     NITROFURANTOIN <=16 SENSITIVE Sensitive     TRIMETH/SULFA <=20 SENSITIVE  Sensitive     AMPICILLIN/SULBACTAM >=32 RESISTANT Resistant     PIP/TAZO 8 SENSITIVE Sensitive     Extended ESBL POSITIVE Resistant     * >=100,000 COLONIES/mL ESCHERICHIA COLI  Culture, blood (Routine x 2)     Status: Abnormal   Collection Time: 09/01/17  2:42 AM  Result Value Ref Range Status   Specimen Description BLOOD SITE NOT SPECIFIED  Final   Special Requests   Final    Blood Culture adequate volume BOTTLES DRAWN  AEROBIC AND ANAEROBIC   Culture  Setup Time   Final    GRAM NEGATIVE RODS IN BOTH AEROBIC AND ANAEROBIC BOTTLES CRITICAL RESULT CALLED TO, READ BACK BY AND VERIFIED WITH: L BAJBUS PHARMD 2230 09/01/17 A BROWNING    Culture (A)  Final    ESCHERICHIA COLI Confirmed Extended Spectrum Beta-Lactamase Producer (ESBL).  In bloodstream infections from ESBL organisms, carbapenems are preferred over piperacillin/tazobactam. They are shown to have a lower risk of mortality. Performed at [facility], [address] [place], [place] [address]    Report Status 09/03/2017 FINAL  Final   Organism ID, Bacteria ESCHERICHIA COLI  Final      Susceptibility   Escherichia coli - MIC*    AMPICILLIN >=32 RESISTANT Resistant     CEFAZOLIN >=64 RESISTANT Resistant     CEFEPIME RESISTANT Resistant     CEFTAZIDIME RESISTANT Resistant     CEFTRIAXONE >=64 RESISTANT Resistant     CIPROFLOXACIN >=4 RESISTANT Resistant     GENTAMICIN >=16 RESISTANT Resistant     IMIPENEM <=0.25 SENSITIVE Sensitive     TRIMETH/SULFA <=20 SENSITIVE Sensitive     AMPICILLIN/SULBACTAM >=32 RESISTANT Resistant     PIP/TAZO 8 SENSITIVE Sensitive     Extended ESBL POSITIVE Resistant     * ESCHERICHIA COLI  Blood Culture ID Panel (Reflexed)     Status: Abnormal   Collection Time: 09/01/17  2:42 AM  Result Value Ref Range Status   Enterococcus species NOT DETECTED NOT DETECTED Final   Listeria monocytogenes NOT DETECTED NOT DETECTED Final   Staphylococcus species NOT DETECTED NOT DETECTED Final    Staphylococcus aureus NOT DETECTED NOT DETECTED Final   Streptococcus species NOT DETECTED NOT DETECTED Final   Streptococcus agalactiae NOT DETECTED NOT DETECTED Final   Streptococcus pneumoniae NOT DETECTED NOT DETECTED Final   Streptococcus pyogenes NOT DETECTED NOT DETECTED Final   Acinetobacter baumannii NOT DETECTED NOT DETECTED Final   Enterobacteriaceae species DETECTED (A) NOT DETECTED Final    Comment: Enterobacteriaceae represent a large family of gram-negative bacteria, not a single organism. CRITICAL RESULT CALLED TO, READ BACK BY AND VERIFIED WITH: L BAJBUS PHARMD 2230 09/01/17 A BROWNING    Enterobacter cloacae complex NOT DETECTED NOT DETECTED Final   Escherichia coli DETECTED (A) NOT DETECTED Final    Comment: CRITICAL RESULT CALLED TO, READ BACK BY AND VERIFIED WITH: L BAJBUS PHARMD 2230 09/01/17 A BROWNING    Klebsiella oxytoca NOT DETECTED NOT DETECTED Final   Klebsiella pneumoniae NOT DETECTED NOT DETECTED Final   Proteus species NOT DETECTED NOT DETECTED Final   Serratia marcescens NOT DETECTED NOT DETECTED Final   Carbapenem resistance NOT DETECTED NOT DETECTED Final   Haemophilus influenzae NOT DETECTED NOT DETECTED Final   Neisseria meningitidis NOT DETECTED NOT DETECTED Final   Pseudomonas aeruginosa NOT DETECTED NOT DETECTED Final   Candida albicans NOT DETECTED NOT DETECTED Final   Candida glabrata NOT DETECTED NOT DETECTED Final   Candida krusei NOT DETECTED NOT DETECTED Final   Candida parapsilosis NOT DETECTED NOT DETECTED Final   Candida tropicalis NOT DETECTED NOT DETECTED Final    Comment: Performed at [facility], [address] [place], [place] [address]  MRSA PCR Screening     Status: Abnormal   Collection Time: 09/01/17  9:48 PM  Result Value Ref Range Status   MRSA by PCR POSITIVE (A) NEGATIVE Final    Comment:        The GeneXpert MRSA Assay (FDA approved  for NASAL specimens only), is one component of a comprehensive MRSA  colonization surveillance program. It is not intended to diagnose MRSA infection nor to guide or monitor treatment for MRSA infections. RESULT CALLED TO, READ BACK BY AND VERIFIED WITH: J.MILLER,RN AT 0041 09/02/17 L.PITT   C difficile quick scan w PCR reflex     Status: None   Collection Time: 09/04/17 10:00 AM  Result Value Ref Range Status   C Diff antigen NEGATIVE NEGATIVE Final   C Diff toxin NEGATIVE NEGATIVE Final   C Diff interpretation No C. difficile detected.  Final    Comment: Performed at [facility], [address], [place], [place] [address]  Culture, blood (routine x 2)     Status: Abnormal   Collection Time: 09/04/17  2:10 PM  Result Value Ref Range Status   Specimen Description BLOOD RIGHT HAND  Final   Special Requests IN PEDIATRIC BOTTLE Blood Culture adequate volume  Final   Culture  Setup Time   Final    IN PEDIATRIC BOTTLE GRAM POSITIVE COCCI CRITICAL RESULT CALLED TO, READ BACK BY AND VERIFIED WITH: Oneta Rack AT 1652 09/05/17 BY L BENFIELD Performed at [facility], [address], [place], [place] [address]    Culture STAPHYLOCOCCUS SPECIES (COAGULASE NEGATIVE) (A)  Final   Report Status 09/07/2017 FINAL  Final   Organism ID, Bacteria STAPHYLOCOCCUS SPECIES (COAGULASE NEGATIVE)  Final      Susceptibility   Staphylococcus species (coagulase negative) - MIC*    CIPROFLOXACIN >=8 RESISTANT Resistant     ERYTHROMYCIN 4 INTERMEDIATE Intermediate     GENTAMICIN <=0.5 SENSITIVE Sensitive     OXACILLIN >=4 RESISTANT Resistant     TETRACYCLINE <=1 SENSITIVE Sensitive     VANCOMYCIN 1 SENSITIVE Sensitive     TRIMETH/SULFA <=10 SENSITIVE Sensitive     CLINDAMYCIN RESISTANT Resistant     RIFAMPIN <=0.5 SENSITIVE Sensitive     Inducible Clindamycin POSITIVE Resistant     * STAPHYLOCOCCUS SPECIES (COAGULASE NEGATIVE)  Blood Culture ID Panel (Reflexed)     Status: Abnormal   Collection Time: 09/04/17  2:10 PM  Result Value Ref Range Status    Enterococcus species NOT DETECTED NOT DETECTED Final   Listeria monocytogenes NOT DETECTED NOT DETECTED Final   Staphylococcus species NOT DETECTED NOT DETECTED Final   Staphylococcus aureus NOT DETECTED NOT DETECTED Final   Streptococcus species NOT DETECTED NOT DETECTED Final   Streptococcus agalactiae NOT DETECTED NOT DETECTED Final   Streptococcus pneumoniae NOT DETECTED NOT DETECTED Final   Streptococcus pyogenes NOT DETECTED NOT DETECTED Final   Acinetobacter baumannii NOT DETECTED NOT DETECTED Final   Enterobacteriaceae species DETECTED (A) NOT DETECTED Final    Comment: Enterobacteriaceae represent a large family of gram negative bacteria, not a single organism. Refer to culture for further identification. CRITICAL RESULT CALLED TO, READ BACK BY AND VERIFIED WITH: M BELL,PHARMD AT 1652 09/05/17 BY L BENFIELD    Enterobacter cloacae complex NOT DETECTED NOT DETECTED Final   Escherichia coli NOT DETECTED NOT DETECTED Final   Klebsiella oxytoca NOT DETECTED NOT DETECTED Final   Klebsiella pneumoniae NOT DETECTED NOT DETECTED Final   Proteus species NOT DETECTED NOT DETECTED Final   Serratia marcescens NOT DETECTED NOT DETECTED Final   Carbapenem resistance NOT DETECTED NOT DETECTED Final   Haemophilus influenzae NOT DETECTED NOT DETECTED Final   Neisseria meningitidis NOT DETECTED NOT DETECTED Final   Pseudomonas aeruginosa NOT DETECTED NOT DETECTED Final   Candida albicans  NOT DETECTED NOT DETECTED Final   Candida glabrata NOT DETECTED NOT DETECTED Final   Candida krusei NOT DETECTED NOT DETECTED Final   Candida parapsilosis NOT DETECTED NOT DETECTED Final   Candida tropicalis NOT DETECTED NOT DETECTED Final    Comment: Performed at [facility], [address] [place], [place] [address]  Culture, blood (routine x 2)     Status: Abnormal   Collection Time: 09/04/17  2:14 PM  Result Value Ref Range Status   Specimen Description BLOOD RIGHT THUMB  Final   Special Requests  IN PEDIATRIC BOTTLE Blood Culture adequate volume  Final   Culture  Setup Time   Final    GRAM POSITIVE COCCI IN PEDIATRIC BOTTLE CRITICAL VALUE NOTED.  VALUE IS CONSISTENT WITH PREVIOUSLY REPORTED AND CALLED VALUE.    Culture (A)  Final    STAPHYLOCOCCUS SPECIES (COAGULASE NEGATIVE) SUSCEPTIBILITIES PERFORMED ON PREVIOUS CULTURE WITHIN THE LAST 5 DAYS. Performed at [facility], [address] [place], [place] [address]    Report Status 09/07/2017 FINAL  Final  Culture, blood (routine x 2)     Status: None (Preliminary result)   Collection Time: 09/06/17 11:42 AM  Result Value Ref Range Status   Specimen Description BLOOD LEFT ANTECUBITAL  Final   Special Requests   Final    IN BOTH AEROBIC AND ANAEROBIC BOTTLES Blood Culture adequate volume   Culture   Final    NO GROWTH 3 DAYS Performed at [facility], [address], [place], [place] [address]    Report Status PENDING  Incomplete  Culture, blood (routine x 2)     Status: None (Preliminary result)   Collection Time: 09/06/17 11:42 AM  Result Value Ref Range Status   Specimen Description BLOOD BLOOD RIGHT FOREARM  Final   Special Requests   Final    IN BOTH AEROBIC AND ANAEROBIC BOTTLES Blood Culture adequate volume   Culture   Final    NO GROWTH 3 DAYS Performed at [facility], [address] [place], [place] [address]    Report Status PENDING  Incomplete         Radiology Studies: Ct Head Wo Contrast  Result Date: 09/09/2017 CLINICAL DATA:  Altered EXAM: CT HEAD WITHOUT CONTRAST TECHNIQUE: Contiguous axial images were obtained from the base of the skull through the vertex without intravenous contrast. COMPARISON:  Head CT dated 09/01/2017 FINDINGS: Brain: Ventricles are stable in size and configuration. Mild chronic small vessel ischemic changes noted within the deep periventricular white matter regions bilaterally. No mass, hemorrhage, edema or other evidence of acute parenchymal abnormality. No  extra-axial hemorrhage. Vascular: There are chronic calcified atherosclerotic changes of the large vessels at the skull base. No unexpected hyperdense vessel. Skull: Normal. Negative for fracture or focal lesion. Sinuses/Orbits: Paranasal sinuses are clear. Chronic fluid within the right mastoid air cells. Periorbital and retro-orbital soft tissues are unremarkable. Other: None. IMPRESSION: 1. No acute findings.  No intracranial mass, hemorrhage or edema. 2. Mild chronic small vessel ischemic changes within the white matter. Electronically Signed   By: Bary Richard M.D.   On: 09/09/2017 17:13        Scheduled Meds: . calcium carbonate  1 tablet Oral TID WC  . diltiazem  120 mg Oral Q12H  . feeding supplement (PRO-STAT SUGAR FREE 64)  30 mL Oral TID BM  . insulin aspart  0-9 Units Subcutaneous TID WC  . multivitamin with minerals  1 tablet Oral  Daily  . rivaroxaban  20 mg Oral Daily   Continuous Infusions: . 0.9 % NaCl with KCl 40 mEq / L 125 mL/hr at 09/10/17 0810  . diltiazem (CARDIZEM) infusion Stopped (09/08/17 0941)  . meropenem (MERREM) IV Stopped (09/10/17 0603)  . vancomycin Stopped (09/09/17 1750)     LOS: 9 days     Alwyn Ren, MD [name]  If 7PM-7AM, please contact night-coverage [email or web address] Password [facility] 09/10/2017, 9:42 AM

## 2017-09-11 ENCOUNTER — Inpatient Hospital Stay (HOSPITAL_COMMUNITY): Payer: Medicare Other

## 2017-09-11 LAB — CBC WITH DIFFERENTIAL/PLATELET
BASOS PCT: 0 %
Basophils Absolute: 0 10*3/uL (ref 0.0–0.1)
EOS ABS: 1.6 10*3/uL — AB (ref 0.0–0.7)
EOS PCT: 15 %
HCT: 32.1 % — ABNORMAL LOW (ref 39.0–52.0)
Hemoglobin: 9.3 g/dL — ABNORMAL LOW (ref 13.0–17.0)
LYMPHS ABS: 3.1 10*3/uL (ref 0.7–4.0)
Lymphocytes Relative: 30 %
MCH: 21.5 pg — AB (ref 26.0–34.0)
MCHC: 29 g/dL — AB (ref 30.0–36.0)
MCV: 74.1 fL — AB (ref 78.0–100.0)
MONO ABS: 0.7 10*3/uL (ref 0.1–1.0)
Monocytes Relative: 7 %
NEUTROS ABS: 5 10*3/uL (ref 1.7–7.7)
Neutrophils Relative %: 48 %
PLATELETS: 384 10*3/uL (ref 150–400)
RBC: 4.33 MIL/uL (ref 4.22–5.81)
RDW: 26.4 % — ABNORMAL HIGH (ref 11.5–15.5)
WBC: 10.4 10*3/uL (ref 4.0–10.5)

## 2017-09-11 LAB — GLUCOSE, CAPILLARY
GLUCOSE-CAPILLARY: 113 mg/dL — AB (ref 65–99)
Glucose-Capillary: 154 mg/dL — ABNORMAL HIGH (ref 65–99)
Glucose-Capillary: 151 mg/dL — ABNORMAL HIGH (ref 65–99)
Glucose-Capillary: 186 mg/dL — ABNORMAL HIGH (ref 65–99)

## 2017-09-11 LAB — CULTURE, BLOOD (ROUTINE X 2)
CULTURE: NO GROWTH
CULTURE: NO GROWTH

## 2017-09-11 MED ORDER — DILTIAZEM HCL ER 60 MG PO CP12
120.0000 mg | ORAL_CAPSULE | Freq: Four times a day (QID) | ORAL | Status: DC
  Administered 2017-09-11 – 2017-09-12 (×5): 120 mg via ORAL
  Filled 2017-09-11 (×6): qty 2

## 2017-09-11 MED ORDER — MAGNESIUM OXIDE 400 (241.3 MG) MG PO TABS
400.0000 mg | ORAL_TABLET | Freq: Two times a day (BID) | ORAL | Status: DC
  Administered 2017-09-11 – 2017-09-13 (×5): 400 mg via ORAL
  Filled 2017-09-11 (×5): qty 1

## 2017-09-11 MED ORDER — POTASSIUM & SODIUM PHOSPHATES 280-160-250 MG PO PACK
1.0000 | PACK | Freq: Three times a day (TID) | ORAL | Status: DC
  Administered 2017-09-11 – 2017-09-13 (×10): 1 via ORAL
  Filled 2017-09-11 (×12): qty 1

## 2017-09-11 MED ORDER — FERROUS SULFATE 325 (65 FE) MG PO TABS
325.0000 mg | ORAL_TABLET | Freq: Two times a day (BID) | ORAL | Status: DC
  Administered 2017-09-11 – 2017-09-13 (×5): 325 mg via ORAL
  Filled 2017-09-11 (×5): qty 1

## 2017-09-11 MED ORDER — LOPERAMIDE HCL 2 MG PO CAPS
4.0000 mg | ORAL_CAPSULE | Freq: Two times a day (BID) | ORAL | Status: DC
  Administered 2017-09-11 – 2017-09-12 (×3): 4 mg via ORAL
  Filled 2017-09-11 (×3): qty 2

## 2017-09-11 MED ORDER — MAGNESIUM SULFATE 4 GM/100ML IV SOLN
4.0000 g | Freq: Once | INTRAVENOUS | Status: AC
  Administered 2017-09-11: 4 g via INTRAVENOUS
  Filled 2017-09-11: qty 100

## 2017-09-11 NOTE — Progress Notes (Signed)
PROGRESS NOTE    [name]  QIO:[id number] DOB: [birth year] DOA: 09/01/2017 PCP: [name], MD (Inactive)  Brief Narrative:67 year old male with past medical history of atrial fibrillation on xarelto, DM, diastolic heart failure, OSA, stage III CKD and status post right AKA admitted on the early morning of 2/21 from his nursing facility with fever and acute encephalopathy. Patient has had previous hospitalizations for acute urinary retention leading to UTI and sepsis. In the emergency room, patient found to be in sepsis secondary to UTI again, Afib with RVR. Pt was started on diltiazem ggt and admitted in the ICU for further management.  Today, pt was noted to be more awake, with improved WOB.Still yellingintermittently due to non-focal pain. Pt still with diarrhea, flexiseal draining liquid brown stool. Pt still lethargic, unable to tolerate orally, although constantly demanding soda. Palliative consulted for GOC. Tried to contact sister, no answer.  09/08/2017 patient in bed but not able to wake him up or start a conversation he opens his eyes on deep rub to his chest. He has not had anything to eat for many days. I have discussed this with the sister who is his POA who will be in to see the patient today. I have also change his CODE STATUS to DO NOT RESUSCITATE after discussing with his sister. Palliative care to see the patient today. 09/09/2017 patient resting in bed.I called his name and asked him to open his eyes to open his eyes and looked at me. He was unable to follow commands or communicate with me.  09/10/2017 patient resting in bed.  Eyes open.  But not able to speak to me.  And does not follow commands.  09/11/2017 when I was outside the patient his room he was looking at me.  This is the first time I have seen him with his eyes open and looking around.  He still does not talk much.  When I asked him if he is in pain he said no.  But I asked if his sister came to see him  he said no.  He still does not follow commands.  His p.o. intake is minimal.  He is refusing to eat.  He is in normal sinus rhythm at this time.  He was on Cardizem drip for A. fib he goes in and out of A. fib.  Currently he is tachycardic on p.o. Cardizem.  He continues to have loose stools.  He has a rectal tube in place.  Assessment & Plan:   Principal Problem:   UTI (urinary tract infection) Active Problems:   Hyperlipidemia   HYPOKALEMIA   Obesity, Class III, BMI 40-49.9 (morbid obesity) (HCC)   ANEMIA   CAD S/P percutaneous coronary angioplasty   Long term (current) use of anticoagulants   Chronic diastolic congestive heart failure (HCC)   CKD (chronic kidney disease) stage 3, GFR 30-59 ml/min (HCC)   Atrial fibrillation with RVR (HCC)   Sepsis, unspecified organism (HCC)   Pressure ulcer   Acute metabolic encephalopathy   S/P AKA (above knee amputation) unilateral, right (HCC)   Acute encephalopathy   Palliative care encounter 1] sepsis secondary to ESBL bacteremia and UTI with urine culture showing ESBL.  Patient has not had a fever, his blood pressure has improved, leukocytosis is improved.  Repeat blood cultures have been sent no growth as of now.  Patient currently on meropenem and vancomycin.  ID was consulted on the phone and was recommended to treat him for a total of  2 weeks.  We should be able to DC his antibiotics this week.  2] acute encephalopathy resolving with treatment of sepsis.  A CT scan of the head done at the time of admission did not reveal any acute changes.  A repeat CT was done 09/09/2017 because of persistent encephalopathy which also did not reveal any acute changes.  3] C. difficile negative ongoing diarrhea has a rectal tube in place.  Will start him on Imodium twice a day to follow-up the stool hopefully we can DC the rectal tube.  Patient has been receiving IV fluids will DC today.  4] chronic atrial fibrillation with rapid ventricular response was  treated with IV Cardizem now on p.o. Cardizem.  The dose of Cardizem will be increased today due to tachycardia.  Continue Xarelto.  5] AK I with history of CKD stage III stable at this time.  6] hypokalemia has been resolved  7] hypophosphatemia and hypomagnesemia-will replete and recheck and will start him on oral standing dose of mag and phos for shorter period of time until his electrolytes corrected corrected.  With his CKD would be cautious in keeping him on it for longer periods of time.  8] chronic microcytic anemia hemoglobin has been stable.  Iron panel shows iron deficiency anemia.  Will start him on iron replacement p.o.  9] type 2 diabetes blood sugar have been stable.  Patient has not been able to eat much by mouth.  He spits up the food.  His home insulin dose is being held and currently on sliding scale insulin.  10] history of CAD and CABG stable.  11] palliative care has been placed and they have seen the patient and a meeting was set up for them to meet with patient's sister but the patient's sister did not show up for the meeting.  I attempted to call her this morning and she did not pick up the phone. DVT prophylaxis: Xarelto Code Status: DO NOT RESUSCITATE Family Communication: Not able to reach sister Disposition Plan: TBD  Consultants: Palliative care  Procedures: None Antimicrobials: Subjective: Vanco and meropenem   Objective: Vitals:   09/11/17 0003 09/11/17 0350 09/11/17 0546 09/11/17 0758  BP: 134/69 128/82  (!) 153/73  Pulse: (!) 108 97  (!) 114  Resp: (!) 31 18  (!) 25  Temp: 98.5 F (36.9 C) 98.6 F (37 C)  98.7 F (37.1 C)  TempSrc: Axillary Axillary  Oral  SpO2: 100% 100%  100%  Weight:   113.8 kg (250 lb 12.8 oz)   Height:        Intake/Output Summary (Last 24 hours) at 09/11/2017 0858 Last data filed at 09/11/2017 0827 Gross per 24 hour  Intake 781.25 ml  Output 3025 ml  Net -2243.75 ml   Filed Weights   09/09/17 0402 09/10/17 0423  09/11/17 0546  Weight: 113.5 kg (250 lb 4.8 oz) 113.6 kg (250 lb 6.4 oz) 113.8 kg (250 lb 12.8 oz)    Examination:  General exam: Appears calm and comfortable  Respiratory system: Clear to auscultation. Respiratory effort normal. Cardiovascular system: S1 & S2 heard, RRR. No JVD, murmurs, rubs, gallops or clicks. No pedal edema. Gastrointestinal system: Abdomen is nondistended, soft and nontender. No organomegaly or masses felt. Normal bowel sounds heard. Central nervous system: Alert and oriented. No focal neurological deficits. Extremitiesr aka left no edema Skin: No rashes, lesions or ulcers    Data Reviewed: I have personally reviewed following labs and imaging studies  CBC: Recent Labs  Lab 09/07/17 0302 09/08/17 0208 09/09/17 0222 09/10/17 0204 09/11/17 0325  WBC 11.3* 13.0* 10.5 11.6* 10.4  NEUTROABS 7.7 7.6 5.2 5.9 5.0  HGB 8.8* 8.0* 8.2* 8.2* 9.3*  HCT 29.7* 28.0* 28.5* 29.1* 32.1*  MCV 71.4* 72.4* 72.7* 73.7* 74.1*  PLT 367 391 401* 419* 384   Basic Metabolic Panel: Recent Labs  Lab 09/05/17 2254 09/06/17 0651 09/06/17 1717 09/07/17 0302 09/08/17 0208 09/09/17 1218 09/10/17 0204 09/10/17 1003  NA 150* 148* 147* 147* 147* 148* 148*  --   K 2.4* 3.0* 3.1* 3.1* 3.0* 3.9 4.4  --   CL 109 107 107 105 105 115* 118*  --   CO2 28 29 28 30 30 23 22   --   GLUCOSE 171* 233* 181* 252* 164* 160* 182*  --   BUN 6 6 5* <5* <5* <5* <5*  --   CREATININE 1.49* 1.40* 1.30* 1.31* 1.24 1.18 1.10  --   CALCIUM 7.7* 7.8* 7.7* 8.0* 7.7* 7.5* 7.5*  --   MG 1.9 1.7 1.6* 1.9  --   --   --  1.7  PHOS 1.3* 2.2* 1.7* 4.1  --   --   --  2.5   GFR: Estimated Creatinine Clearance: 91.1 mL/min (by C-G formula based on SCr of 1.1 mg/dL). Liver Function Tests: No results for input(s): AST, ALT, ALKPHOS, BILITOT, PROT, ALBUMIN in the last 168 hours. No results for input(s): LIPASE, AMYLASE in the last 168 hours. Recent Labs  Lab 09/08/17 1217  AMMONIA 28   Coagulation  Profile: No results for input(s): INR, PROTIME in the last 168 hours. Cardiac Enzymes: Recent Labs  Lab 09/05/17 2254  CKTOTAL 28*   BNP (last 3 results) No results for input(s): PROBNP in the last 8760 hours. HbA1C: No results for input(s): HGBA1C in the last 72 hours. CBG: Recent Labs  Lab 09/10/17 0802 09/10/17 1210 09/10/17 1711 09/10/17 2124 09/11/17 0822  GLUCAP 175* 168* 139* 129* 154*   Lipid Profile: No results for input(s): CHOL, HDL, LDLCALC, TRIG, CHOLHDL, LDLDIRECT in the last 72 hours. Thyroid Function Tests: No results for input(s): TSH, T4TOTAL, FREET4, T3FREE, THYROIDAB in the last 72 hours. Anemia Panel: No results for input(s): VITAMINB12, FOLATE, FERRITIN, TIBC, IRON, RETICCTPCT in the last 72 hours. Sepsis Labs: No results for input(s): PROCALCITON, LATICACIDVEN in the last 168 hours.  Recent Results (from the past 240 hour(s))  MRSA PCR Screening     Status: Abnormal   Collection Time: 09/01/17  9:48 PM  Result Value Ref Range Status   MRSA by PCR POSITIVE (A) NEGATIVE Final    Comment:        The GeneXpert MRSA Assay (FDA approved for NASAL specimens only), is one component of a comprehensive MRSA colonization surveillance program. It is not intended to diagnose MRSA infection nor to guide or monitor treatment for MRSA infections. RESULT CALLED TO, READ BACK BY AND VERIFIED WITH: J.MILLER,RN AT 0041 09/02/17 L.PITT   C difficile quick scan w PCR reflex     Status: None   Collection Time: 09/04/17 10:00 AM  Result Value Ref Range Status   C Diff antigen NEGATIVE NEGATIVE Final   C Diff toxin NEGATIVE NEGATIVE Final   C Diff interpretation No C. difficile detected.  Final    Comment: Performed at [facility], [address], [place], [place]  Culture, blood (routine x 2)     Status: Abnormal   Collection Time: 09/04/17  2:10 PM  Result  Value Ref Range Status   Specimen Description BLOOD RIGHT HAND  Final   Special  Requests IN PEDIATRIC BOTTLE Blood Culture adequate volume  Final   Culture  Setup Time   Final    IN PEDIATRIC BOTTLE GRAM POSITIVE COCCI CRITICAL RESULT CALLED TO, READ BACK BY AND VERIFIED WITH: Oneta Rack AT 1652 09/05/17 BY L BENFIELD Performed at [facility], [address] [place], [place] [address]    Culture STAPHYLOCOCCUS SPECIES (COAGULASE NEGATIVE) (A)  Final   Report Status 09/07/2017 FINAL  Final   Organism ID, Bacteria STAPHYLOCOCCUS SPECIES (COAGULASE NEGATIVE)  Final      Susceptibility   Staphylococcus species (coagulase negative) - MIC*    CIPROFLOXACIN >=8 RESISTANT Resistant     ERYTHROMYCIN 4 INTERMEDIATE Intermediate     GENTAMICIN <=0.5 SENSITIVE Sensitive     OXACILLIN >=4 RESISTANT Resistant     TETRACYCLINE <=1 SENSITIVE Sensitive     VANCOMYCIN 1 SENSITIVE Sensitive     TRIMETH/SULFA <=10 SENSITIVE Sensitive     CLINDAMYCIN RESISTANT Resistant     RIFAMPIN <=0.5 SENSITIVE Sensitive     Inducible Clindamycin POSITIVE Resistant     * STAPHYLOCOCCUS SPECIES (COAGULASE NEGATIVE)  Blood Culture ID Panel (Reflexed)     Status: Abnormal   Collection Time: 09/04/17  2:10 PM  Result Value Ref Range Status   Enterococcus species NOT DETECTED NOT DETECTED Final   Listeria monocytogenes NOT DETECTED NOT DETECTED Final   Staphylococcus species NOT DETECTED NOT DETECTED Final   Staphylococcus aureus NOT DETECTED NOT DETECTED Final   Streptococcus species NOT DETECTED NOT DETECTED Final   Streptococcus agalactiae NOT DETECTED NOT DETECTED Final   Streptococcus pneumoniae NOT DETECTED NOT DETECTED Final   Streptococcus pyogenes NOT DETECTED NOT DETECTED Final   Acinetobacter baumannii NOT DETECTED NOT DETECTED Final   Enterobacteriaceae species DETECTED (A) NOT DETECTED Final    Comment: Enterobacteriaceae represent a large family of gram negative bacteria, not a single organism. Refer to culture for further identification. CRITICAL RESULT CALLED TO, READ  BACK BY AND VERIFIED WITH: M BELL,PHARMD AT 1652 09/05/17 BY L BENFIELD    Enterobacter cloacae complex NOT DETECTED NOT DETECTED Final   Escherichia coli NOT DETECTED NOT DETECTED Final   Klebsiella oxytoca NOT DETECTED NOT DETECTED Final   Klebsiella pneumoniae NOT DETECTED NOT DETECTED Final   Proteus species NOT DETECTED NOT DETECTED Final   Serratia marcescens NOT DETECTED NOT DETECTED Final   Carbapenem resistance NOT DETECTED NOT DETECTED Final   Haemophilus influenzae NOT DETECTED NOT DETECTED Final   Neisseria meningitidis NOT DETECTED NOT DETECTED Final   Pseudomonas aeruginosa NOT DETECTED NOT DETECTED Final   Candida albicans NOT DETECTED NOT DETECTED Final   Candida glabrata NOT DETECTED NOT DETECTED Final   Candida krusei NOT DETECTED NOT DETECTED Final   Candida parapsilosis NOT DETECTED NOT DETECTED Final   Candida tropicalis NOT DETECTED NOT DETECTED Final    Comment: Performed at [facility], [address], [place], [place] [address]  Culture, blood (routine x 2)     Status: Abnormal   Collection Time: 09/04/17  2:14 PM  Result Value Ref Range Status   Specimen Description BLOOD RIGHT THUMB  Final   Special Requests IN PEDIATRIC BOTTLE Blood Culture adequate volume  Final   Culture  Setup Time   Final    GRAM POSITIVE COCCI IN PEDIATRIC BOTTLE CRITICAL VALUE NOTED.  VALUE IS CONSISTENT WITH PREVIOUSLY REPORTED AND CALLED VALUE.  Culture (A)  Final    STAPHYLOCOCCUS SPECIES (COAGULASE NEGATIVE) SUSCEPTIBILITIES PERFORMED ON PREVIOUS CULTURE WITHIN THE LAST 5 DAYS. Performed at [facility], [address] [place], [place] [address]    Report Status 09/07/2017 FINAL  Final  Culture, blood (routine x 2)     Status: None (Preliminary result)   Collection Time: 09/06/17 11:42 AM  Result Value Ref Range Status   Specimen Description BLOOD LEFT ANTECUBITAL  Final   Special Requests   Final    IN BOTH AEROBIC AND ANAEROBIC BOTTLES Blood Culture  adequate volume   Culture   Final    NO GROWTH 4 DAYS Performed at [facility], [address], [place], [place] [address]    Report Status PENDING  Incomplete  Culture, blood (routine x 2)     Status: None (Preliminary result)   Collection Time: 09/06/17 11:42 AM  Result Value Ref Range Status   Specimen Description BLOOD BLOOD RIGHT FOREARM  Final   Special Requests   Final    IN BOTH AEROBIC AND ANAEROBIC BOTTLES Blood Culture adequate volume   Culture   Final    NO GROWTH 4 DAYS Performed at [facility], [address], [place], [place] [address]    Report Status PENDING  Incomplete  Culture, blood (routine x 2)     Status: None (Preliminary result)   Collection Time: 09/09/17 12:10 PM  Result Value Ref Range Status   Specimen Description BLOOD LEFT HAND  Final   Special Requests IN PEDIATRIC BOTTLE Blood Culture adequate volume  Final   Culture   Final    NO GROWTH 1 DAY Performed at [facility], [address] [place], [place] [address]    Report Status PENDING  Incomplete  Culture, blood (routine x 2)     Status: None (Preliminary result)   Collection Time: 09/09/17 12:17 PM  Result Value Ref Range Status   Specimen Description BLOOD RIGHT ANTECUBITAL  Final   Special Requests IN PEDIATRIC BOTTLE Blood Culture adequate volume  Final   Culture   Final    NO GROWTH 1 DAY Performed at [facility], [address], [place], [place] [address]    Report Status PENDING  Incomplete         Radiology Studies: Ct Head Wo Contrast  Result Date: 09/09/2017 CLINICAL DATA:  Altered EXAM: CT HEAD WITHOUT CONTRAST TECHNIQUE: Contiguous axial images were obtained from the base of the skull through the vertex without intravenous contrast. COMPARISON:  Head CT dated 09/01/2017 FINDINGS: Brain: Ventricles are stable in size and configuration. Mild chronic small vessel ischemic changes noted within the deep periventricular white matter regions bilaterally.  No mass, hemorrhage, edema or other evidence of acute parenchymal abnormality. No extra-axial hemorrhage. Vascular: There are chronic calcified atherosclerotic changes of the large vessels at the skull base. No unexpected hyperdense vessel. Skull: Normal. Negative for fracture or focal lesion. Sinuses/Orbits: Paranasal sinuses are clear. Chronic fluid within the right mastoid air cells. Periorbital and retro-orbital soft tissues are unremarkable. Other: None. IMPRESSION: 1. No acute findings.  No intracranial mass, hemorrhage or edema. 2. Mild chronic small vessel ischemic changes within the white matter. Electronically Signed   By: Bary Richard M.D.   On: 09/09/2017 17:13        Scheduled Meds: . calcium carbonate  1 tablet Oral TID WC  . diltiazem  120 mg Oral Q6H  . feeding supplement (PRO-STAT SUGAR FREE  64)  30 mL Oral TID BM  . insulin aspart  0-9 Units Subcutaneous TID WC  . loperamide  4 mg Oral BID  . multivitamin with minerals  1 tablet Oral Daily  . rivaroxaban  20 mg Oral Daily   Continuous Infusions: . 0.9 % NaCl with KCl 40 mEq / L 125 mL/hr (09/11/17 0317)  . diltiazem (CARDIZEM) infusion Stopped (09/08/17 0941)  . meropenem (MERREM) IV Stopped (09/11/17 0551)  . vancomycin Stopped (09/10/17 1554)     LOS: 10 days     Alwyn Ren, MD [facility] If 7PM-7AM, please contact night-coverage [email or web address] Password [facility] 09/11/2017, 8:58 AM

## 2017-09-11 NOTE — Progress Notes (Signed)
Patient continues to be non-verbal but nodes head in response to question. Slept well last night. Took night time medication. Staff gave him a bath this morning. Patient is total dependent for all Adl's. Cannot turn and help in repositioning. Continues to have loose stool. Flexiseal in place.

## 2017-09-12 LAB — CBC WITH DIFFERENTIAL/PLATELET
BASOS ABS: 0.1 10*3/uL (ref 0.0–0.1)
Basophils Relative: 1 %
EOS PCT: 12 %
Eosinophils Absolute: 1.2 10*3/uL — ABNORMAL HIGH (ref 0.0–0.7)
HEMATOCRIT: 32.1 % — AB (ref 39.0–52.0)
Hemoglobin: 9.2 g/dL — ABNORMAL LOW (ref 13.0–17.0)
Lymphocytes Relative: 30 %
Lymphs Abs: 3 10*3/uL (ref 0.7–4.0)
MCH: 21.4 pg — ABNORMAL LOW (ref 26.0–34.0)
MCHC: 28.7 g/dL — AB (ref 30.0–36.0)
MCV: 74.8 fL — AB (ref 78.0–100.0)
MONO ABS: 0.7 10*3/uL (ref 0.1–1.0)
MONOS PCT: 7 %
NEUTROS ABS: 4.9 10*3/uL (ref 1.7–7.7)
Neutrophils Relative %: 50 %
PLATELETS: 391 10*3/uL (ref 150–400)
RBC: 4.29 MIL/uL (ref 4.22–5.81)
RDW: 26.4 % — AB (ref 11.5–15.5)
WBC: 9.9 10*3/uL (ref 4.0–10.5)

## 2017-09-12 LAB — GLUCOSE, CAPILLARY
GLUCOSE-CAPILLARY: 130 mg/dL — AB (ref 65–99)
Glucose-Capillary: 166 mg/dL — ABNORMAL HIGH (ref 65–99)
Glucose-Capillary: 213 mg/dL — ABNORMAL HIGH (ref 65–99)
Glucose-Capillary: 129 mg/dL — ABNORMAL HIGH (ref 65–99)

## 2017-09-12 LAB — BASIC METABOLIC PANEL
Anion gap: 10 (ref 5–15)
BUN: 9 mg/dL (ref 6–20)
CALCIUM: 8.1 mg/dL — AB (ref 8.9–10.3)
CO2: 19 mmol/L — AB (ref 22–32)
Chloride: 119 mmol/L — ABNORMAL HIGH (ref 101–111)
Creatinine, Ser: 1.03 mg/dL (ref 0.61–1.24)
GFR calc Af Amer: >60 mL/min (ref >60)
GLUCOSE: 179 mg/dL — AB (ref 65–99)
Potassium: 4.5 mmol/L (ref 3.5–5.1)
Sodium: 148 mmol/L — ABNORMAL HIGH (ref 135–145)

## 2017-09-12 MED ORDER — TRAMADOL HCL 50 MG PO TABS
50.0000 mg | ORAL_TABLET | Freq: Four times a day (QID) | ORAL | Status: DC
  Filled 2017-09-12: qty 1

## 2017-09-12 MED ORDER — LOPERAMIDE HCL 2 MG PO CAPS
8.0000 mg | ORAL_CAPSULE | Freq: Two times a day (BID) | ORAL | Status: DC
  Administered 2017-09-12 – 2017-09-13 (×2): 8 mg via ORAL
  Filled 2017-09-12 (×2): qty 4

## 2017-09-12 MED ORDER — SODIUM CHLORIDE 0.45 % IV SOLN
INTRAVENOUS | Status: DC
  Administered 2017-09-12 (×2): via INTRAVENOUS

## 2017-09-12 MED ORDER — DILTIAZEM HCL 60 MG PO TABS
120.0000 mg | ORAL_TABLET | Freq: Four times a day (QID) | ORAL | Status: DC
  Administered 2017-09-12 – 2017-09-13 (×4): 120 mg via ORAL
  Filled 2017-09-12 (×5): qty 2

## 2017-09-12 MED ORDER — TRAMADOL HCL 50 MG PO TABS
50.0000 mg | ORAL_TABLET | Freq: Four times a day (QID) | ORAL | Status: DC | PRN
  Administered 2017-09-12 – 2017-09-13 (×2): 50 mg via ORAL
  Filled 2017-09-12: qty 1

## 2017-09-12 MED ORDER — ENSURE ENLIVE PO LIQD
237.0000 mL | Freq: Three times a day (TID) | ORAL | Status: DC
  Administered 2017-09-12 – 2017-09-13 (×2): 237 mL via ORAL

## 2017-09-12 NOTE — NC FL2 (Deleted)
[place] MEDICAID FL2 LEVEL OF CARE SCREENING TOOL     IDENTIFICATION  Patient Name: [name] Birthdate: 12-Sep-1950 Sex: male Admission Date (Current Location): 09/01/2017  [facility] and [place] Number:  Producer, television/film/video and Address:  [facility], [address] [place], [place] [address]      Provider Number: [id number]  Attending Physician Name and Address:  Alwyn Ren, MD  Relative Name and Phone Number:       Current Level of Care: Hospital Recommended Level of Care: Skilled Nursing Facility Prior Approval Number:    Date Approved/Denied:   PASRR Number: [id number]  Discharge Plan: SNF    Current Diagnoses: Patient Active Problem List   Diagnosis Date Noted  . Acute encephalopathy   . Palliative care encounter   . UTI (urinary tract infection) 09/01/2017  . Acute metabolic encephalopathy 09/01/2017  . S/P AKA (above knee amputation) unilateral, right (HCC) 09/01/2017  . Atrial fibrillation with rapid ventricular response (HCC)   . Hyponatremia 04/11/2017  . ARF (acute renal failure) (HCC) 04/11/2017  . Pressure injury of skin 03/24/2017  . Below knee amputation status, right (HCC) 03/23/2017  . DKA (diabetic ketoacidoses) (HCC) 03/14/2017  . Elevated troponin 03/14/2017  . Diabetic polyneuropathy associated with type 2 diabetes mellitus (HCC)   . Osteoarthritis of both knees   . Diabetic nephropathy associated with type 2 diabetes mellitus (HCC)   . Bilateral lower leg cellulitis 01/22/2017  . Venous stasis 08/06/2016  . At high risk for falls 07/21/2016  . CHF exacerbation (HCC) 06/10/2016  . Lactic acidosis 06/09/2016  . Syncope and collapse 03/05/2016  . Acute on chronic renal insufficiency 03/05/2016  . Obesity 12/30/2015  . Scrotal swelling   . Foot fracture   . 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 Lower GI bleed   . Pressure ulcer 12/24/2015  . Acute kidney injury (HCC) 12/23/2015   . Back pain 12/23/2015  . Fall   . Metatarsal fracture   . Traumatic rhabdomyolysis (HCC)   . Hyperkalemia   . Lisfranc dislocation   . Hyperglycemia   . Sepsis, unspecified organism (HCC) 09/08/2015  . Scrotal wall abscess 09/08/2015  . AKI (acute kidney injury) (HCC) 09/08/2015  . Chest wall pain   . On amiodarone therapy 12/08/2014  . Respiratory failure with hypoxia (HCC) 12/04/2014  . Hypoxia 12/04/2014  . Acute on chronic respiratory failure with hypoxia (HCC)   . Atrial fibrillation with RVR (HCC) 09/20/2014  . Acute on chronic diastolic CHF (congestive heart failure) (HCC) 09/19/2014  . Acute respiratory failure (HCC) 09/19/2014  . Abnormal nuclear cardiac imaging test 02/12/2014  . Dyspnea 02/12/2014  . Chronic anticoagulation- Coumadin 02/12/2014  . CKD (chronic kidney disease) stage 3, GFR 30-59 ml/min (HCC) 02/12/2014  . Chronic diastolic congestive heart failure (HCC) 02/11/2014  . Encounter for therapeutic drug monitoring 08/03/2013  . Pilonidal cyst 01/25/2013  . Perirectal cellulitis 09/06/2012  . Long term (current) use of anticoagulants 11/18/2011  . Obstructive sleep apnea- unable to tolerate c-pap 08/04/2011  . CAD S/P percutaneous coronary angioplasty 08/04/2011  . ANEMIA-IRON DEFICIENCY 01/06/2009  . OTHER AND UNSPECIFIED COAGULATION DEFECTS 01/06/2009  . ANEMIA 01/02/2009  . CARDIOVASCULAR FUNCTION STUDY, ABNORMAL 01/02/2009  . Essential hypertension, benign 10/22/2008  . Atrial fibrillation 10/22/2008  . Hyperlipidemia 09/23/2008  . HYPOKALEMIA 09/23/2008  . Obesity, Class III, BMI 40-49.9 (morbid obesity) (HCC) 09/23/2008  . CELLULITIS AND ABSCESS OF LEG EXCEPT FOOT 09/23/2008  Orientation RESPIRATION BLADDER Height & Weight     Place, Self  Normal Continent Weight: 250 lb (113.4 kg) Height:  6\' 3"  (190.5 cm)  BEHAVIORAL SYMPTOMS/MOOD NEUROLOGICAL BOWEL NUTRITION STATUS      Incontinent    AMBULATORY STATUS COMMUNICATION OF NEEDS Skin      Verbally(does not tlk much)                         Personal Care Assistance Level of Assistance  Bathing, Feeding, Dressing           Functional Limitations Info  Sight, Hearing, Speech Sight Info: Adequate Hearing Info: Adequate Speech Info: Adequate    SPECIAL CARE FACTORS FREQUENCY  PT (By licensed PT), OT (By licensed OT)     PT Frequency: 2x wk OT Frequency: 2x wk            Contractures Contractures Info: Not present    Additional Factors Info  Allergies, Isolation Precautions Code Status Info: Full Allergies Info: NKDA     Isolation Precautions Info: MRSA, ESBL     Current Medications (09/12/2017):  This is the current hospital active medication list Current Facility-Administered Medications  Medication Dose Route Frequency Provider Last Rate Last Dose  . 0.45 % sodium chloride infusion   Intravenous Continuous 11/12/2017, MD 100 mL/hr at 09/12/17 [phone]    . acetaminophen (TYLENOL) tablet 650 mg  650 mg Oral Q6H PRN 7262, MD   650 mg at 09/09/17 2135   Or  . acetaminophen (TYLENOL) suppository 650 mg  650 mg Rectal Q6H PRN 2136, MD   650 mg at 09/02/17 1349  . calcium carbonate (OS-CAL - dosed in mg of elemental calcium) tablet 500 mg of elemental calcium  1 tablet Oral TID WC 09/04/17, MD   500 mg of elemental calcium at 09/12/17 1000  . diltiazem (CARDIZEM SR) 12 hr capsule 120 mg  120 mg Oral Q6H 11/12/17, MD   120 mg at 09/12/17 0934  . diltiazem (CARDIZEM) 100 mg in dextrose 5% 11/12/17 (1 mg/mL) infusion  5-15 mg/hr Intravenous Titrated , MD   Stopped at 09/08/17 [phone]  . feeding supplement (PRO-STAT SUGAR FREE 64) liquid 30 mL  30 mL Oral TID BM 0355, MD   30 mL at 09/12/17 0934  . ferrous sulfate tablet 325 mg  325 mg Oral BID WC 11/12/17, MD   325 mg at 09/12/17 1000  . haloperidol lactate (HALDOL) injection 2 mg  2 mg Intravenous Q6H PRN 11/12/17, MD    2 mg at 09/10/17 2106  . insulin aspart (novoLOG) injection 0-9 Units  0-9 Units Subcutaneous TID WC 2107, MD   2 Units at 09/12/17 [phone]  . loperamide (IMODIUM) capsule 2 mg  2 mg Oral Q6H PRN 9741, MD      . loperamide (IMODIUM) capsule 4 mg  4 mg Oral BID Alwyn Ren, MD   4 mg at 09/12/17 0934  . magnesium oxide (MAG-OX) tablet 400 mg  400 mg Oral BID 11/12/17, MD   400 mg at 09/12/17 0934  . meropenem (MERREM) 1 g in sodium chloride 0.9 % 100 mL IVPB  1 g Intravenous Q8H 11/12/17, [facility]   Stopped at 09/12/17 0630  . multivitamin with minerals tablet 1 tablet  1 tablet Oral Daily 11/12/17, MD   1 tablet at  09/12/17 0934  . ondansetron (ZOFRAN) tablet 4 mg  4 mg Oral Q6H PRN Lorretta Harp, MD       Or  . ondansetron [facility]) injection 4 mg  4 mg Intravenous Q6H PRN Lorretta Harp, MD   4 mg at 09/06/17 2338  . potassium & sodium phosphates (PHOS-NAK) 280-160-250 MG packet 1 packet  1 packet Oral TID WC & HS Alwyn Ren, MD   1 packet at 09/12/17 1000  . rivaroxaban (XARELTO) tablet 20 mg  20 mg Oral Daily Briant Cedar, MD   20 mg at 09/11/17 1749     Discharge Medications: Please see discharge summary for a list of discharge medications.  Relevant Imaging Results:  Relevant Lab Results:   Additional Information SS# [id number] pt will go out with rectul tube and IV antibiotics(merepenem)  Althea Charon, LCSW

## 2017-09-12 NOTE — Progress Notes (Signed)
Patient refusing turns and dressing changes at this time, becoming physically aggressive towards RNs. Will continue to attempt.

## 2017-09-12 NOTE — Progress Notes (Signed)
ANTIBIOTIC CONSULT NOTE   Pharmacy Consult for Merrem Indication: bacteremia  No Known Allergies  Patient Measurements: Height: 6\' 3"  (190.5 cm) Weight: 250 lb (113.4 kg) IBW/kg (Calculated) : 84.5  Vital Signs: Temp: 97.6 F (36.4 C) (03/04 0700) Temp Source: Oral (03/04 0700) BP: 111/59 (03/04 0700) Pulse Rate: 84 (03/04 0700) Intake/Output from previous day: 03/03 0701 - 03/04 0700 In: 1985 [P.O.:660; I.V.:125; IV Piggyback:1200] Out: 3750 [Urine:3350; Stool:400] Intake/Output from this shift: No intake/output data recorded.  Labs: Recent Labs    09/09/17 1218 09/10/17 0204 09/11/17 0325 09/12/17 0306  WBC  --  11.6* 10.4 9.9  HGB  --  8.2* 9.3* 9.2*  PLT  --  419* 384 391  CREATININE 1.18 1.10  --  1.03   Estimated Creatinine Clearance: 97.2 mL/min (by C-G formula based on SCr of 1.03 mg/dL). No results for input(s): VANCOTROUGH, VANCOPEAK, VANCORANDOM, GENTTROUGH, GENTPEAK, GENTRANDOM, TOBRATROUGH, TOBRAPEAK, TOBRARND, AMIKACINPEAK, AMIKACINTROU, AMIKACIN in the last 72 hours.   Microbiology:   Medical History: Past Medical History:  Diagnosis Date  . Arthritis    "hands and lower back" (09/19/2014)  . CAD (coronary artery disease)    a. s/p PCI to RCA in 2012. b. prior cath in 01/2014 with elevated L/RH pressures, mild-mod CAD of LAD/LCx with patent RCA, normal EF, c/b CIN/CHF.  02/2014 Carpal tunnel syndrome, bilateral   . Cellulitis   . Chronic diastolic CHF (congestive heart failure) (HCC)   . Chronic kidney disease (CKD), stage III (moderate) (HCC)   . Chronic lower back pain   . Diabetes (HCC)   . DKA (diabetic ketoacidoses) (HCC) 03/2017  . History of blood transfusion ~ 1954   "related to OR"  . Hyperlipidemia   . Hypertension   . Hypoxia    a. Qualified for home O2 at [facility] in 09/2014.  10/2014 Lower GI bleed   . Microcytic anemia   . Morbid obesity (HCC)   . Neuropathy   . OSA (obstructive sleep apnea)    "I wear nasal prongs; haven't been using prongs  recently" (09/19/2014)  . PAF (paroxysmal atrial fibrillation) (HCC)    TEE DCCV 09/23/2014  . Physical deconditioning   . Pilonidal cyst 1980's; 01/25/2013  . Scrotal abscess   . Type II diabetes mellitus (HCC)     Assessment: 66 yom found to have ESBL E Coli bacteremia likely secondary to UTI. Has been treated with vancomycin since 2/26 and meropenem started on 2/23. BCx on 2/24 grew CoNS with methicillin resistance with all repeat cx showing NG.   Admitting MD spoke with ID and okay to discontinue vancomycin for CoNS treatment- plan to continue meropenem for total of 14 days. WBC WNL. Afebrile currently. Scr is stable at 1.03 (normCrCl 72 mL/min).   Vanco 2/25>> 3/4 Meropenem 2/23 >> Ceftriaxone 2/21>>2/23 Zosyn x 1 on 2/21  3/2 BCx x2: NGTD 2/26: BCx x2: NGTD  2/24: BC x 2: GPC x 2>>CoNS 2/24 BCID: Enterbacter species 2/24: CDif neg 2/21 blood x 2 - ESBL-E. coli 2/21 urine - > 100 K/ml ESBL-E.coli 2/21 MRSA PCR positive 2/21 Influeza PCR negative   Plan:  - Merrem 1g IV q8h - Monitor renal function, clinical picture, and duration of therapy  3/21, PharmD Clinical Pharmacist  Pager: [phone] Clinical Phone for 09/12/2017 until 3:30pm: [phone] If after 3:30pm, please call main pharmacy at [phone] 09/12/2017 10:40 AM

## 2017-09-12 NOTE — Progress Notes (Signed)
Palliative:   I met today with [name]. He is much more alert and oriented to person, place but not so much to situation. I did attempt to explain that he has been very ill and has been ill for the past 1-2 years and especially the past few months since his amputation. He is not eating but drinking plenty fluids. He says he is just not hungry and I explained that he will never improve if he does not eat. I tried to simply explain that there are options and if he gets tired of coming to the hospital and being tested and stuck than he always has the option to focus on being comfortable and happy. I am unsure if he understands. Unfortunately his sister is very difficult to reach so I did not attempt to call her today. Will need outpatient palliative follow up at SNF.   25 min  Paul Sill, NP Palliative Medicine Team Pager # [phone] (M-F 8a-5p) Team Phone # [phone] (Nights/Weekends)

## 2017-09-12 NOTE — NC FL2 (Signed)
[place] MEDICAID FL2 LEVEL OF CARE SCREENING TOOL     IDENTIFICATION  Patient Name: Paul PEREZPEREZ Birthdate: 1951/03/11 Sex: male Admission Date (Current Location): 09/01/2017  [facility] and [place] Number:  Producer, television/film/video and Address:  [facility] [place], [address] [place], [place] [address]      Provider Number: [id number]  Attending Physician Name and Address:  Alwyn Ren, MD  Relative Name and Phone Number:       Current Level of Care: Hospital Recommended Level of Care: Skilled Nursing Facility Prior Approval Number:    Date Approved/Denied:   PASRR Number: [id number]  Discharge Plan: SNF    Current Diagnoses: Patient Active Problem List   Diagnosis Date Noted  . Acute encephalopathy   . Palliative care encounter   . UTI (urinary tract infection) 09/01/2017  . Acute metabolic encephalopathy 09/01/2017  . S/P AKA (above knee amputation) unilateral, right (HCC) 09/01/2017  . Atrial fibrillation with rapid ventricular response (HCC)   . Hyponatremia 04/11/2017  . ARF (acute renal failure) (HCC) 04/11/2017  . Pressure injury of skin 03/24/2017  . Below knee amputation status, right (HCC) 03/23/2017  . DKA (diabetic ketoacidoses) (HCC) 03/14/2017  . Elevated troponin 03/14/2017  . Diabetic polyneuropathy associated with type 2 diabetes mellitus (HCC)   . Osteoarthritis of both knees   . Diabetic nephropathy associated with type 2 diabetes mellitus (HCC)   . Bilateral lower leg cellulitis 01/22/2017  . Venous stasis 08/06/2016  . At high risk for falls 07/21/2016  . CHF exacerbation (HCC) 06/10/2016  . Lactic acidosis 06/09/2016  . Syncope and collapse 03/05/2016  . Acute on chronic renal insufficiency 03/05/2016  . Obesity 12/30/2015  . Scrotal swelling   . Foot fracture   . 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 Lower GI bleed   . Pressure ulcer 12/24/2015  . Acute kidney injury (HCC) 12/23/2015   . Back pain 12/23/2015  . Fall   . Metatarsal fracture   . Traumatic rhabdomyolysis (HCC)   . Hyperkalemia   . Lisfranc dislocation   . Hyperglycemia   . Sepsis, unspecified organism (HCC) 09/08/2015  . Scrotal wall abscess 09/08/2015  . AKI (acute kidney injury) (HCC) 09/08/2015  . Chest wall pain   . On amiodarone therapy 12/08/2014  . Respiratory failure with hypoxia (HCC) 12/04/2014  . Hypoxia 12/04/2014  . Acute on chronic respiratory failure with hypoxia (HCC)   . Atrial fibrillation with RVR (HCC) 09/20/2014  . Acute on chronic diastolic CHF (congestive heart failure) (HCC) 09/19/2014  . Acute respiratory failure (HCC) 09/19/2014  . Abnormal nuclear cardiac imaging test 02/12/2014  . Dyspnea 02/12/2014  . Chronic anticoagulation- Coumadin 02/12/2014  . CKD (chronic kidney disease) stage 3, GFR 30-59 ml/min (HCC) 02/12/2014  . Chronic diastolic congestive heart failure (HCC) 02/11/2014  . Encounter for therapeutic drug monitoring 08/03/2013  . Pilonidal cyst 01/25/2013  . Perirectal cellulitis 09/06/2012  . Long term (current) use of anticoagulants 11/18/2011  . Obstructive sleep apnea- unable to tolerate c-pap 08/04/2011  . CAD S/P percutaneous coronary angioplasty 08/04/2011  . ANEMIA-IRON DEFICIENCY 01/06/2009  . OTHER AND UNSPECIFIED COAGULATION DEFECTS 01/06/2009  . ANEMIA 01/02/2009  . CARDIOVASCULAR FUNCTION STUDY, ABNORMAL 01/02/2009  . Essential hypertension, benign 10/22/2008  . Atrial fibrillation 10/22/2008  . Hyperlipidemia 09/23/2008  . HYPOKALEMIA 09/23/2008  . Obesity, Class III, BMI 40-49.9 (morbid obesity) (HCC) 09/23/2008  . CELLULITIS AND ABSCESS OF LEG EXCEPT FOOT 09/23/2008  Orientation RESPIRATION BLADDER Height & Weight     Place, Self  Normal Continent Weight: 250 lb (113.4 kg) Height:  6\' 3"  (190.5 cm)  BEHAVIORAL SYMPTOMS/MOOD NEUROLOGICAL BOWEL NUTRITION STATUS      Incontinent    AMBULATORY STATUS COMMUNICATION OF NEEDS Skin      Verbally(does not tlk much)                         Personal Care Assistance Level of Assistance  Bathing, Feeding, Dressing           Functional Limitations Info  Sight, Hearing, Speech Sight Info: Adequate Hearing Info: Adequate Speech Info: Adequate    SPECIAL CARE FACTORS FREQUENCY  PT (By licensed PT), OT (By licensed OT)     PT Frequency: 2x wk OT Frequency: 2x wk            Contractures Contractures Info: Not present    Additional Factors Info  Allergies, Isolation Precautions Code Status Info: DNR Allergies Info: NKDA     Isolation Precautions Info: MRSA, ESBL     Current Medications (09/12/2017):  This is the current hospital active medication list Current Facility-Administered Medications  Medication Dose Route Frequency Provider Last Rate Last Dose  . 0.45 % sodium chloride infusion   Intravenous Continuous Alwyn Ren, MD 100 mL/hr at 09/12/17 [phone]    . acetaminophen (TYLENOL) tablet 650 mg  650 mg Oral Q6H PRN Lorretta Harp, MD   650 mg at 09/09/17 2135   Or  . acetaminophen (TYLENOL) suppository 650 mg  650 mg Rectal Q6H PRN Lorretta Harp, MD   650 mg at 09/02/17 1349  . calcium carbonate (OS-CAL - dosed in mg of elemental calcium) tablet 500 mg of elemental calcium  1 tablet Oral TID WC Briant Cedar, MD   500 mg of elemental calcium at 09/12/17 1000  . diltiazem (CARDIZEM SR) 12 hr capsule 120 mg  120 mg Oral Q6H Alwyn Ren, MD   120 mg at 09/12/17 0934  . diltiazem (CARDIZEM) 100 mg in dextrose 5% (1 mg/mL) infusion  5-15 mg/hr Intravenous Titrated Hollice Espy, MD   Stopped at 09/08/17 [phone]  . feeding supplement (PRO-STAT SUGAR FREE 64) liquid 30 mL  30 mL Oral TID BM Briant Cedar, MD   30 mL at 09/12/17 0934  . ferrous sulfate tablet 325 mg  325 mg Oral BID WC Alwyn Ren, MD   325 mg at 09/12/17 1000  . haloperidol lactate (HALDOL) injection 2 mg  2 mg Intravenous Q6H PRN Hollice Espy, MD    2 mg at 09/10/17 2106  . insulin aspart (novoLOG) injection 0-9 Units  0-9 Units Subcutaneous TID WC Lorretta Harp, MD   2 Units at 09/12/17 [phone]  . loperamide (IMODIUM) capsule 2 mg  2 mg Oral Q6H PRN Alwyn Ren, MD      . loperamide (IMODIUM) capsule 4 mg  4 mg Oral BID Alwyn Ren, MD   4 mg at 09/12/17 0934  . magnesium oxide (MAG-OX) tablet 400 mg  400 mg Oral BID Alwyn Ren, MD   400 mg at 09/12/17 0934  . meropenem (MERREM) 1 g in sodium chloride 0.9 % 100 mL IVPB  1 g Intravenous Q8H Mosetta Anis, [facility]   Stopped at 09/12/17 0630  . multivitamin with minerals tablet 1 tablet  1 tablet Oral Daily Briant Cedar, MD   1 tablet at  09/12/17 0934  . ondansetron (ZOFRAN) tablet 4 mg  4 mg Oral Q6H PRN Lorretta Harp, MD       Or  . ondansetron [facility]) injection 4 mg  4 mg Intravenous Q6H PRN Lorretta Harp, MD   4 mg at 09/06/17 2338  . potassium & sodium phosphates (PHOS-NAK) 280-160-250 MG packet 1 packet  1 packet Oral TID WC & HS Alwyn Ren, MD   1 packet at 09/12/17 1000  . rivaroxaban (XARELTO) tablet 20 mg  20 mg Oral Daily Briant Cedar, MD   20 mg at 09/11/17 1749     Discharge Medications: Please see discharge summary for a list of discharge medications.  Relevant Imaging Results:  Relevant Lab Results:   Additional Information SS# [id number] pt will go out with rectul tube and IV antibiotics(merepenem)  Althea Charon, LCSW

## 2017-09-12 NOTE — Progress Notes (Signed)
Clinical Social Worker following patient for discharge needs. Patient is from [facility] long term care. Admission coordinator is ware that patient is going out with IV antibiotics and stated they will be able to take patient with medication. CSW made facility aware that will discharge tomorrow per MD.   Marrianne Mood, MSW,  Theresia Majors [phone]

## 2017-09-12 NOTE — Progress Notes (Addendum)
PROGRESS NOTE    [name]  WUJ:[id number] DOB: 1950-12-03 DOA: 09/01/2017 PCP: [name], MD (Inactive)   Brief Narrative:67 year old male with past medical history of atrial fibrillation on xarelto, DM, diastolic heart failure, OSA, stage III CKD and status post right AKA admitted on the early morning of 2/21 from his nursing facility with fever and acute encephalopathy. Patient has had previous hospitalizations for acute urinary retention leading to UTI and sepsis. In the emergency room, patient found to be in sepsis secondary to UTI again, Afib with RVR. Pt was started on diltiazem ggt and admitted in the ICU for further management.  Today, pt was noted to be more awake, with improved WOB.Still yellingintermittently due to non-focal pain. Pt still with diarrhea, flexiseal draining liquid brown stool. Pt still lethargic, unable to tolerate orally, although constantly demanding soda. Palliative consulted for GOC. Tried to contact sister, no answer.  09/08/2017 patient in bed but not able to wake him up or start a conversation he opens his eyes on deep rub to his chest. He has not had anything to eat for many days. I have discussed this with the sister who is his POA who will be in to see the patient today. I have also change his CODE STATUS to DO NOT RESUSCITATE after discussing with his sister. Palliative care to see the patient today. 09/09/2017 patient resting in bed.I called his name and asked him to open his eyes to open his eyes and looked at me. He was unable to follow commands or communicate with me.  09/10/2017 patient resting in bed. Eyes open. But not able to speak to me. And does not follow commands.  09/11/2017 when I was outside the patient his room he was looking at me.  This is the first time I have seen him with his eyes open and looking around.  He still does not talk much.  When I asked him if he is in pain he said no.  But I asked if his sister came to see  him he said no.  He still does not follow commands.  His p.o. intake is minimal.  He is refusing to eat.  He is in normal sinus rhythm at this time.  He was on Cardizem drip for A. fib he goes in and out of A. fib.  Currently he is tachycardic on p.o. Cardizem.  He continues to have loose stools.  He has a rectal tube in place.  Assessment & Plan:   Principal Problem:   UTI (urinary tract infection) Active Problems:   Hyperlipidemia   HYPOKALEMIA   Obesity, Class III, BMI 40-49.9 (morbid obesity) (HCC)   ANEMIA   CAD S/P percutaneous coronary angioplasty   Long term (current) use of anticoagulants   Chronic diastolic congestive heart failure (HCC)   CKD (chronic kidney disease) stage 3, GFR 30-59 ml/min (HCC)   Atrial fibrillation with RVR (HCC)   Sepsis, unspecified organism (HCC)   Pressure ulcer   Acute metabolic encephalopathy   S/P AKA (above knee amputation) unilateral, right (HCC)   Acute encephalopathy   Palliative care encounter 1] sepsis secondary to ESBL bacteremia and UTI with urine culture showing ESBL.  Patient has not had a fever, his blood pressure has improved, leukocytosis is improved.  Repeat blood cultures have been sent no growth as of now.  Patient currently on meropenem and vancomycin.  ID was consulted on the phone and was recommended to treat him for a total of 2 weeks.MEROPENEM  started 09/03/2017 last dose should be 09/16/2017.  Vanco was started 09/06/2017 for coag negative staph 2/2 bottles.  Repeat blood cultures have been negative so far.  DC vancomycin.  Discussed with infectious disease.  2] acute encephalopathy resolving with treatment of sepsis.  A CT scan of the head done at the time of admission did not reveal any acute changes.  A repeat CT was done 09/09/2017 because of persistent encephalopathy which also did not reveal any acute changes.  3] C. difficile negative ongoing diarrhea has a rectal tube in place.  Will start him on Imodium twice a day to  follow-up the stool hopefully we can DC the rectal tube.  4] chronic atrial fibrillation with rapid ventricular response was treated with IV Cardizem now on p.o. Cardizem.  The dose of Cardizem will be increased today due to tachycardia.  Continue Xarelto.  5] AK I with history of CKD stage III stable at this time.  6] hypokalemia has been resolved  7] hypophosphatemia and hypomagnesemia-will replete and recheck and will start him on oral standing dose of mag and phos for shorter period of time until his electrolytes corrected corrected.  With his CKD would be cautious in keeping him on it for longer periods of time. 8] chronic microcytic anemia hemoglobin has been stable.  Iron panel shows iron deficiency anemia.  Will start him on iron replacement p.o.  9] type 2 diabetes blood sugar have been stable.  Patient has not been able to eat much by mouth.  He spits up the food.  His home insulin dose is being held and currently on sliding scale insulin.  10] history of CAD and CABG stable.  11] palliative care has been placed and they have seen the patient and a meeting was set up for them to meet with patient's sister but the patient's sister did not show up for the meeting.  I attempted to call her this morning and she did not pick up the phone.  12] Hypernatremia-will restart half-normal saline and recheck the levels. DVT prophylaxis: Xarelto      DVT prophylaxis:XARELTO Code Status:DNR Family Communication:WILL attempt to call sister. Disposition Plan:tbd Consultants:  palliative Procedures: none Antimicrobials:vanco and mero Subjective:much more awake alert asking for ginger ale  Objective: Vitals:   09/11/17 2021 09/11/17 2348 09/12/17 0452 09/12/17 0700  BP: 111/60 113/67 133/78 (!) 111/59  Pulse: 91 87 86 84  Resp: (!) 29 (!) 21 (!) 21 16  Temp: 98 F (36.7 C) 98.6 F (37 C) 98.7 F (37.1 C) 97.6 F (36.4 C)  TempSrc: Oral Oral Oral Oral  SpO2: 100% 100% 99%  100%  Weight:   113.4 kg (250 lb)   Height:        Intake/Output Summary (Last 24 hours) at 09/12/2017 0915 Last data filed at 09/12/2017 0456 Gross per 24 hour  Intake 1860 ml  Output 2600 ml  Net -740 ml   Filed Weights   09/10/17 0423 09/11/17 0546 09/12/17 0452  Weight: 113.6 kg (250 lb 6.4 oz) 113.8 kg (250 lb 12.8 oz) 113.4 kg (250 lb)    Examination:  General exam: Appears calm and comfortable  Respiratory system: Clear to auscultation. Respiratory effort normal. Cardiovascular system: S1 & S2 heard, RRR. No JVD, murmurs, rubs, gallops or clicks. No pedal edema. Gastrointestinal system: Abdomen is nondistended, soft and nontender. No organomegaly or masses felt. Normal bowel sounds heard. Central nervous system: Alert and oriented. No focal neurological deficits.  Skin: No rashes, lesions  or ulcers    Data Reviewed: I have personally reviewed following labs and imaging studies  CBC: Recent Labs  Lab 09/08/17 0208 09/09/17 0222 09/10/17 0204 09/11/17 0325 09/12/17 0306  WBC 13.0* 10.5 11.6* 10.4 9.9  NEUTROABS 7.6 5.2 5.9 5.0 4.9  HGB 8.0* 8.2* 8.2* 9.3* 9.2*  HCT 28.0* 28.5* 29.1* 32.1* 32.1*  MCV 72.4* 72.7* 73.7* 74.1* 74.8*  PLT 391 401* 419* 384 391   Basic Metabolic Panel: Recent Labs  Lab 09/05/17 2254 09/06/17 0651 09/06/17 1717 09/07/17 0302 09/08/17 0208 09/09/17 1218 09/10/17 0204 09/10/17 1003 09/12/17 0306  NA 150* 148* 147* 147* 147* 148* 148*  --  148*  K 2.4* 3.0* 3.1* 3.1* 3.0* 3.9 4.4  --  4.5  CL 109 107 107 105 105 115* 118*  --  119*  CO2 28 29 28 30 30 23 22   --  19*  GLUCOSE 171* 233* 181* 252* 164* 160* 182*  --  179*  BUN 6 6 5* <5* <5* <5* <5*  --  9  CREATININE 1.49* 1.40* 1.30* 1.31* 1.24 1.18 1.10  --  1.03  CALCIUM 7.7* 7.8* 7.7* 8.0* 7.7* 7.5* 7.5*  --  8.1*  MG 1.9 1.7 1.6* 1.9  --   --   --  1.7  --   PHOS 1.3* 2.2* 1.7* 4.1  --   --   --  2.5  --    GFR: Estimated Creatinine Clearance: 97.2 mL/min (by C-G  formula based on SCr of 1.03 mg/dL). Liver Function Tests: No results for input(s): AST, ALT, ALKPHOS, BILITOT, PROT, ALBUMIN in the last 168 hours. No results for input(s): LIPASE, AMYLASE in the last 168 hours. Recent Labs  Lab 09/08/17 1217  AMMONIA 28   Coagulation Profile: No results for input(s): INR, PROTIME in the last 168 hours. Cardiac Enzymes: Recent Labs  Lab 09/05/17 2254  CKTOTAL 28*   BNP (last 3 results) No results for input(s): PROBNP in the last 8760 hours. HbA1C: No results for input(s): HGBA1C in the last 72 hours. CBG: Recent Labs  Lab 09/11/17 0822 09/11/17 1159 09/11/17 1606 09/11/17 2102 09/12/17 0803  GLUCAP 154* 151* 186* 113* 166*   Lipid Profile: No results for input(s): CHOL, HDL, LDLCALC, TRIG, CHOLHDL, LDLDIRECT in the last 72 hours. Thyroid Function Tests: No results for input(s): TSH, T4TOTAL, FREET4, T3FREE, THYROIDAB in the last 72 hours. Anemia Panel: No results for input(s): VITAMINB12, FOLATE, FERRITIN, TIBC, IRON, RETICCTPCT in the last 72 hours. Sepsis Labs: No results for input(s): PROCALCITON, LATICACIDVEN in the last 168 hours.  Recent Results (from the past 240 hour(s))  C difficile quick scan w PCR reflex     Status: None   Collection Time: 09/04/17 10:00 AM  Result Value Ref Range Status   C Diff antigen NEGATIVE NEGATIVE Final   C Diff toxin NEGATIVE NEGATIVE Final   C Diff interpretation No C. difficile detected.  Final    Comment: Performed at [facility], [address] [place], [place] [address]  Culture, blood (routine x 2)     Status: Abnormal   Collection Time: 09/04/17  2:10 PM  Result Value Ref Range Status   Specimen Description BLOOD RIGHT HAND  Final   Special Requests IN PEDIATRIC BOTTLE Blood Culture adequate volume  Final   Culture  Setup Time   Final    IN PEDIATRIC BOTTLE GRAM POSITIVE COCCI CRITICAL RESULT CALLED TO, READ BACK BY AND VERIFIED WITH: M BELL,PHARMD AT 1652 09/05/17  BY L  BENFIELD Performed at [facility], [address], [place], [place] [address]    Culture STAPHYLOCOCCUS SPECIES (COAGULASE NEGATIVE) (A)  Final   Report Status 09/07/2017 FINAL  Final   Organism ID, Bacteria STAPHYLOCOCCUS SPECIES (COAGULASE NEGATIVE)  Final      Susceptibility   Staphylococcus species (coagulase negative) - MIC*    CIPROFLOXACIN >=8 RESISTANT Resistant     ERYTHROMYCIN 4 INTERMEDIATE Intermediate     GENTAMICIN <=0.5 SENSITIVE Sensitive     OXACILLIN >=4 RESISTANT Resistant     TETRACYCLINE <=1 SENSITIVE Sensitive     VANCOMYCIN 1 SENSITIVE Sensitive     TRIMETH/SULFA <=10 SENSITIVE Sensitive     CLINDAMYCIN RESISTANT Resistant     RIFAMPIN <=0.5 SENSITIVE Sensitive     Inducible Clindamycin POSITIVE Resistant     * STAPHYLOCOCCUS SPECIES (COAGULASE NEGATIVE)  Blood Culture ID Panel (Reflexed)     Status: Abnormal   Collection Time: 09/04/17  2:10 PM  Result Value Ref Range Status   Enterococcus species NOT DETECTED NOT DETECTED Final   Listeria monocytogenes NOT DETECTED NOT DETECTED Final   Staphylococcus species NOT DETECTED NOT DETECTED Final   Staphylococcus aureus NOT DETECTED NOT DETECTED Final   Streptococcus species NOT DETECTED NOT DETECTED Final   Streptococcus agalactiae NOT DETECTED NOT DETECTED Final   Streptococcus pneumoniae NOT DETECTED NOT DETECTED Final   Streptococcus pyogenes NOT DETECTED NOT DETECTED Final   Acinetobacter baumannii NOT DETECTED NOT DETECTED Final   Enterobacteriaceae species DETECTED (A) NOT DETECTED Final    Comment: Enterobacteriaceae represent a large family of gram negative bacteria, not a single organism. Refer to culture for further identification. CRITICAL RESULT CALLED TO, READ BACK BY AND VERIFIED WITH: M BELL,PHARMD AT 1652 09/05/17 BY L BENFIELD    Enterobacter cloacae complex NOT DETECTED NOT DETECTED Final   Escherichia coli NOT DETECTED NOT DETECTED Final   Klebsiella oxytoca NOT DETECTED NOT DETECTED  Final   Klebsiella pneumoniae NOT DETECTED NOT DETECTED Final   Proteus species NOT DETECTED NOT DETECTED Final   Serratia marcescens NOT DETECTED NOT DETECTED Final   Carbapenem resistance NOT DETECTED NOT DETECTED Final   Haemophilus influenzae NOT DETECTED NOT DETECTED Final   Neisseria meningitidis NOT DETECTED NOT DETECTED Final   Pseudomonas aeruginosa NOT DETECTED NOT DETECTED Final   Candida albicans NOT DETECTED NOT DETECTED Final   Candida glabrata NOT DETECTED NOT DETECTED Final   Candida krusei NOT DETECTED NOT DETECTED Final   Candida parapsilosis NOT DETECTED NOT DETECTED Final   Candida tropicalis NOT DETECTED NOT DETECTED Final    Comment: Performed at [facility], [address], [place], [place] [address]  Culture, blood (routine x 2)     Status: Abnormal   Collection Time: 09/04/17  2:14 PM  Result Value Ref Range Status   Specimen Description BLOOD RIGHT THUMB  Final   Special Requests IN PEDIATRIC BOTTLE Blood Culture adequate volume  Final   Culture  Setup Time   Final    GRAM POSITIVE COCCI IN PEDIATRIC BOTTLE CRITICAL VALUE NOTED.  VALUE IS CONSISTENT WITH PREVIOUSLY REPORTED AND CALLED VALUE.    Culture (A)  Final    STAPHYLOCOCCUS SPECIES (COAGULASE NEGATIVE) SUSCEPTIBILITIES PERFORMED ON PREVIOUS CULTURE WITHIN THE LAST 5 DAYS. Performed at [facility], [address], [place], [place] [address]    Report Status 09/07/2017 FINAL  Final  Culture, blood (routine x 2)     Status: None   Collection  Time: 09/06/17 11:42 AM  Result Value Ref Range Status   Specimen Description BLOOD LEFT ANTECUBITAL  Final   Special Requests   Final    IN BOTH AEROBIC AND ANAEROBIC BOTTLES Blood Culture adequate volume   Culture   Final    NO GROWTH 5 DAYS Performed at [facility], [address] [place], [place] [address]    Report Status 09/11/2017 FINAL  Final  Culture, blood (routine x 2)     Status: None   Collection Time: 09/06/17 11:42 AM    Result Value Ref Range Status   Specimen Description BLOOD BLOOD RIGHT FOREARM  Final   Special Requests   Final    IN BOTH AEROBIC AND ANAEROBIC BOTTLES Blood Culture adequate volume   Culture   Final    NO GROWTH 5 DAYS Performed at [facility], [address], [place], [place] [address]    Report Status 09/11/2017 FINAL  Final  Culture, blood (routine x 2)     Status: None (Preliminary result)   Collection Time: 09/09/17 12:10 PM  Result Value Ref Range Status   Specimen Description BLOOD LEFT HAND  Final   Special Requests IN PEDIATRIC BOTTLE Blood Culture adequate volume  Final   Culture   Final    NO GROWTH 2 DAYS Performed at [facility], [address] [place], [place] [address]    Report Status PENDING  Incomplete  Culture, blood (routine x 2)     Status: None (Preliminary result)   Collection Time: 09/09/17 12:17 PM  Result Value Ref Range Status   Specimen Description BLOOD RIGHT ANTECUBITAL  Final   Special Requests IN PEDIATRIC BOTTLE Blood Culture adequate volume  Final   Culture   Final    NO GROWTH 2 DAYS Performed at [facility], [address] [place], [place] [address]    Report Status PENDING  Incomplete  Culture, blood (routine x 2)     Status: None (Preliminary result)   Collection Time: 09/10/17 10:03 AM  Result Value Ref Range Status   Specimen Description BLOOD RIGHT ANTECUBITAL  Final   Special Requests IN PEDIATRIC BOTTLE Blood Culture adequate volume  Final   Culture   Final    NO GROWTH 1 DAY Performed at [facility], [address], [place], [place] [address]    Report Status PENDING  Incomplete  Culture, blood (routine x 2)     Status: None (Preliminary result)   Collection Time: 09/10/17 10:03 AM  Result Value Ref Range Status   Specimen Description BLOOD LEFT HAND  Final   Special Requests IN PEDIATRIC BOTTLE Blood Culture adequate volume  Final   Culture   Final    NO GROWTH 1 DAY Performed at [facility], [address] [place], [place] [address]    Report Status PENDING  Incomplete         Radiology Studies: Dg Chest Port 1 View  Result Date: 09/11/2017 CLINICAL DATA:  Tachycardia. EXAM: PORTABLE CHEST 1 VIEW COMPARISON:  09/07/2017 FINDINGS: Cardiac silhouette is mildly enlarged. No mediastinal or hilar masses. There are prominent bronchovascular markings, stable. No convincing pulmonary edema. No lung consolidation to suggest pneumonia. No visible pleural effusion.  No pneumothorax. Skeletal structures are grossly intact. IMPRESSION: No acute cardiopulmonary disease. Electronically Signed   By: Amie Portland M.D.   On: 09/11/2017 10:27        Scheduled Meds: . calcium carbonate  1 tablet Oral TID WC  . diltiazem  120 mg Oral Q6H  . feeding supplement (PRO-STAT SUGAR FREE 64)  30 mL Oral TID BM  . ferrous sulfate  325 mg Oral BID WC  . insulin aspart  0-9 Units Subcutaneous TID WC  . loperamide  4 mg Oral BID  . magnesium oxide  400 mg Oral BID  . multivitamin with minerals  1 tablet Oral Daily  . potassium & sodium phosphates  1 packet Oral TID WC & HS  . rivaroxaban  20 mg Oral Daily   Continuous Infusions: . diltiazem (CARDIZEM) infusion Stopped (09/08/17 0941)  . meropenem (MERREM) IV Stopped (09/12/17 0630)  . vancomycin Stopped (09/11/17 1740)     LOS: 11 days      Alwyn Ren, MD [name]  If 7PM-7AM, please contact night-coverage [email or web address] Password [email or web address] 09/12/2017, 9:15 AM

## 2017-09-12 NOTE — Progress Notes (Signed)
Nutrition Follow-up  DOCUMENTATION CODES:   Obesity unspecified  INTERVENTION:   -Contiunue 30 ml Prostat TID, each supplement provides 100 kcals and 15 grams protein -Ensure Enlive po TID, each supplement provides 350 kcal and 20 grams of protein -Continue MVI daily  NUTRITION DIAGNOSIS:   Increased nutrient needs related to wound healing as evidenced by estimated needs.  Ongoing  GOAL:   Patient will meet greater than or equal to 90% of their needs  Unmet  MONITOR:   PO intake, Supplement acceptance, Diet advancement, Labs, Weight trends, Skin  REASON FOR ASSESSMENT:   Consult Assessment of nutrition requirement/status  ASSESSMENT:   [name] is a 67 y.o. male with medical history significant of hypertension, hyperlipidemia, diabetes mellitus, atrial fibrillation on Xarelto, OSA not on CPAP, GI bleeding, dCHF, COPD, CKD-3, s/p of right AKA, who presents with fever and altered mental status.  3/1- s/p BSE- advanced to dysphagia 1 diet with thin liquids  Pt attempting to feed himself lunch at time of visit. Pt unable to participate in conversation.   Per chart review, pt has been refusing food. Intake has been minimal; PO: 0-15%. Pt is taking Prostat and MVI per MAR.   Per CSW notes, plan to discharge back to [facility] tomorrow (09/13/17).   Pt with poor oral intake and would benefit from nutrient dense supplement. One Ensure Enlive supplement provides 350 kcals, 20 grams protein, and 44-45 grams of carbohydrate vs one Glucerna shake supplement, which provides 220 kcals, 10 grams of protein, and 26 grams of carbohydrate. Given pt's hx of DM, RD will continue to monitor PO intake, CBGS, and adjust supplement regimen as appropriate.   Labs reviewed: Na: 148, CBGS: 113-213 (inpatient orders for glycemic control are 0-9 units insulin aspart TID with meals)  Diet Order:  DIET - DYS 1 Room service appropriate? Yes; Fluid consistency: Thin  EDUCATION  NEEDS:   Not appropriate for education at this time  Skin:  Skin Assessment: Skin Integrity Issues: Skin Integrity Issues:: Incisions, Stage IV Stage IV: sacrum Incisions: closed rt AKA incision  Last BM:  09/05/17 (via rectal tube)  Height:   Ht Readings from Last 1 Encounters:  09/01/17 6\' 3"  (1.905 m)    Weight:   Wt Readings from Last 1 Encounters:  09/12/17 250 lb (113.4 kg)    Ideal Body Weight:  81.9 kg  BMI:  Body mass index is 31.25 kg/m.  Estimated Nutritional Needs:   Kcal:  2250-2450  Protein:  120-135 grams  Fluid:  2.2-2.4 L    Laya Letendre A. 11/12/17, RD, LDN, CDE Pager: [phone] After hours Pager: [phone]

## 2017-09-13 LAB — HEMOGLOBIN AND HEMATOCRIT, BLOOD
HEMATOCRIT: 26.5 % — AB (ref 39.0–52.0)
Hemoglobin: 7.7 g/dL — ABNORMAL LOW (ref 13.0–17.0)

## 2017-09-13 LAB — GLUCOSE, CAPILLARY
GLUCOSE-CAPILLARY: 157 mg/dL — AB (ref 65–99)
Glucose-Capillary: 175 mg/dL — ABNORMAL HIGH (ref 65–99)

## 2017-09-13 LAB — CBC WITH DIFFERENTIAL/PLATELET
Basophils Absolute: 0.1 10*3/uL (ref 0.0–0.1)
Basophils Relative: 1 %
EOS PCT: 12 %
Eosinophils Absolute: 1.2 10*3/uL — ABNORMAL HIGH (ref 0.0–0.7)
HEMATOCRIT: 27 % — AB (ref 39.0–52.0)
Hemoglobin: 7.7 g/dL — ABNORMAL LOW (ref 13.0–17.0)
LYMPHS ABS: 3 10*3/uL (ref 0.7–4.0)
Lymphocytes Relative: 31 %
MCH: 21 pg — AB (ref 26.0–34.0)
MCHC: 28.5 g/dL — AB (ref 30.0–36.0)
MCV: 73.8 fL — AB (ref 78.0–100.0)
MONO ABS: 0.7 10*3/uL (ref 0.1–1.0)
MONOS PCT: 7 %
NEUTROS ABS: 4.7 10*3/uL (ref 1.7–7.7)
Neutrophils Relative %: 49 %
Platelets: 353 10*3/uL (ref 150–400)
RBC: 3.66 MIL/uL — ABNORMAL LOW (ref 4.22–5.81)
RDW: 26 % — AB (ref 11.5–15.5)
WBC: 9.7 10*3/uL (ref 4.0–10.5)

## 2017-09-13 LAB — BASIC METABOLIC PANEL
Anion gap: 7 (ref 5–15)
BUN: 10 mg/dL (ref 6–20)
CALCIUM: 8 mg/dL — AB (ref 8.9–10.3)
CO2: 20 mmol/L — AB (ref 22–32)
CREATININE: 0.96 mg/dL (ref 0.61–1.24)
Chloride: 115 mmol/L — ABNORMAL HIGH (ref 101–111)
GFR calc Af Amer: >60 mL/min (ref >60)
GFR calc non Af Amer: >60 mL/min (ref >60)
GLUCOSE: 150 mg/dL — AB (ref 65–99)
Potassium: 3.8 mmol/L (ref 3.5–5.1)
Sodium: 142 mmol/L (ref 135–145)

## 2017-09-13 LAB — OCCULT BLOOD X 1 CARD TO LAB, STOOL: Fecal Occult Bld: NEGATIVE

## 2017-09-13 LAB — PHOSPHORUS: PHOSPHORUS: 3.7 mg/dL (ref 2.5–4.6)

## 2017-09-13 LAB — MAGNESIUM: Magnesium: 1.9 mg/dL (ref 1.7–2.4)

## 2017-09-13 MED ORDER — OXYCODONE HCL 5 MG PO TABS
15.0000 mg | ORAL_TABLET | Freq: Once | ORAL | Status: AC
  Administered 2017-09-13: 15 mg via ORAL
  Filled 2017-09-13: qty 3

## 2017-09-13 MED ORDER — MEROPENEM IV (FOR PTA / DISCHARGE USE ONLY): 1.0000 g | Freq: Three times a day (TID) | INTRAVENOUS | 0 refills | Status: DC

## 2017-09-13 MED ORDER — POTASSIUM & SODIUM PHOSPHATES 280-160-250 MG PO PACK: 1.0000 | PACK | Freq: Three times a day (TID) | ORAL | 0 refills | Status: AC

## 2017-09-13 MED ORDER — LOPERAMIDE HCL 2 MG PO CAPS: 2.0000 mg | ORAL_CAPSULE | Freq: Four times a day (QID) | ORAL | 0 refills | Status: AC | PRN

## 2017-09-13 MED ORDER — SODIUM CHLORIDE 0.9 % IV SOLN: 1.0000 g | Freq: Three times a day (TID) | INTRAVENOUS | 0 refills | Status: DC

## 2017-09-13 MED ORDER — CALCIUM CARBONATE 1250 (500 CA) MG PO TABS: 1.0000 | ORAL_TABLET | Freq: Three times a day (TID) | ORAL | Status: DC

## 2017-09-13 MED ORDER — FERROUS SULFATE 325 (65 FE) MG PO TABS: 325.0000 mg | ORAL_TABLET | Freq: Two times a day (BID) | ORAL | 0 refills | Status: AC

## 2017-09-13 MED ORDER — DILTIAZEM HCL 120 MG PO TABS: 120.0000 mg | ORAL_TABLET | Freq: Four times a day (QID) | ORAL | 0 refills | Status: DC

## 2017-09-13 NOTE — Discharge Summary (Signed)
Physician Discharge Summary  [name] ZOX:[id number] DOB: Oct 21, 1950 DOA: 09/01/2017  PCP: [name], MD (Inactive)  Admit date: 09/01/2017 Discharge date: 09/13/2017  Admitted From: Nursing home Disposition: Nursing home Recommendations for Outpatient Follow-up:  1. Follow up with PCP in 1-2 weeks 2. Please obtain BMP/CBC in one week 3. PALLIATIVE CARE TO FOLLOW PATIENT AT NURSING HOME  Home Health none Equipment/Devices: None Discharge Condition stable CODE STATUS: DNR Diet recommendation: Cardiac Brief/Interim Summary::67 year old male with past medical history of atrial fibrillation on xarelto, DM, diastolic heart failure, OSA, stage III CKD and status post right AKA admitted on the early morning of 2/21 from his nursing facility with fever and acute encephalopathy. Patient has had previous hospitalizations for acute urinary retention leading to UTI and sepsis. In the emergency room, patient found to be in sepsis secondary to UTI again, Afib with RVR. Pt was started on diltiazem ggt and admitted in the ICU for further management.  Today, pt was noted to be more awake, with improved WOB.Still yellingintermittently due to non-focal pain. Pt still with diarrhea, flexiseal draining liquid brown stool. Pt still lethargic, unable to tolerate orally, although constantly demanding soda. Palliative consulted for GOC. Tried to contact sister, no answer.  09/08/2017 patient in bed but not able to wake him up or start a conversation he opens his eyes on deep rub to his chest. He has not had anything to eat for many days. I have discussed this with the sister who is his POA who will be in to see the patient today. I have also change his CODE STATUS to DO NOT RESUSCITATE after discussing with his sister. Palliative care to see the patient today. 09/09/2017 patient resting in bed.I called his name and asked him to open his eyes to open his eyes and looked at me. He was unable to follow  commands or communicate with me.  09/10/2017 patient resting in bed. Eyes open. But not able to speak to me. And does not follow commands.  09/09/2017 patient resting in bed.I called his name and asked him to open his eyes to open his eyes and looked at me. He was unable to follow commands or communicate with me.  09/10/2017 patient resting in bed. Eyes open. But not able to speak to me. And does not follow commands.  09/11/2017 when I was outside the patient his room he was looking at me. This is the first time I have seen him with his eyes open and looking around. He still does not talk much. When I asked him if he is in pain he said no. But I asked if his sister came to see him he said no. He still does not follow commands. His p.o. intake is minimal. He is refusing to eat. He is in normal sinus rhythm at this time. He was on Cardizem drip for A. fib he goes in and out of A. fib. Currently he is tachycardic on p.o. Cardizem. He continues to have loose stools. He has a rectal tube in place.  3 09/13/2017 patient much more awake alert asking for pain medication.  He is eating better.  He has a rectal tube in place which will be DC'd today.  He will be discharged to the nursing home on meropenem until 09/16/2017.      Discharge Diagnoses:  Principal Problem:   UTI (urinary tract infection) Active Problems:   Hyperlipidemia   HYPOKALEMIA   Obesity, Class III, BMI 40-49.9 (morbid obesity) (HCC)  ANEMIA   CAD S/P percutaneous coronary angioplasty   Long term (current) use of anticoagulants   Chronic diastolic congestive heart failure (HCC)   CKD (chronic kidney disease) stage 3, GFR 30-59 ml/min (HCC)   Atrial fibrillation with RVR (HCC)   Sepsis, unspecified organism (HCC)   Pressure ulcer   Acute metabolic encephalopathy   S/P AKA (above knee amputation) unilateral, right (HCC)   Acute encephalopathy   Palliative care encounter 1]sepsis secondary to ESBL bacteremia  and UTI with urine culture showing ESBL. Patient has not had a fever, his blood pressure has improved, leukocytosis is improved. Repeat blood cultures have been sent no growth as of now. Patient currently on meropenem and vancomycin. ID was consulted on the phone and was recommended to treat him for a total of 2 weeks.MEROPENEM started 09/03/2017 last dose should be 09/16/2017.  Vanco was started 09/06/2017 for coag negative staph 2/2 bottles.  Repeat blood cultures have been negative so far.  DC vancomycin.  Discussed with infectious disease.  2]acute encephalopathy resolved with treatment of sepsis. A CT scan of the head done at the time of admission did not reveal any acute changes. A repeat CT was done 09/09/2017 because of persistent encephalopathy which also did not reveal any acute changes.  3]C. difficile negative ongoing diarrhea would continue as needed Imodium.  DC rectal tube.  4]chronic atrial fibrillation with rapid ventricular response was treated with IV Cardizem now on p.o. Cardizem. The dose of Cardizem will be increased today due to tachycardia. Continue Xarelto.  5]AK I with history of CKD stage III stable at this time.  6]hypokalemia has been resolved  7]hypophosphatemia and hypomagnesemia-have been repleted.   8]chronic microcytic anemia hemoglobin has been stable. Iron panel shows iron deficiency anemia. Will start him on iron replacement p.o.  9]type 2 diabetes blood sugar have been stable.  Patient has started to eat better again.  Restart his home dose of long-acting insulin.   10]history of CAD and CABG stable.  11]palliative care has been placed and they have seen the patient and a meeting was set up for them to meet with patient's sister but the patient's sister did not show up for the meeting. I attempted to call her this morning and she did not pick up the phone.  Please have palliative care follow the patient at skilled nursing  facility.  12] Hypernatremia-resolved with IV hydration.      Discharge Instructions   Allergies as of 09/13/2017   No Known Allergies     Medication List    STOP taking these medications   cefTRIAXone 2 g injection Commonly known as:  ROCEPHIN   diltiazem 240 MG 24 hr capsule Commonly known as:  CARDIZEM CD   docusate sodium 100 MG capsule Commonly known as:  COLACE   heparin 5000 UNIT/ML injection   insulin aspart 100 UNIT/ML injection Commonly known as:  novoLOG   insulin glargine 100 UNIT/ML injection Commonly known as:  LANTUS   LANTUS 100 UNIT/ML injection Generic drug:  insulin glargine   metoprolol tartrate 50 MG tablet Commonly known as:  LOPRESSOR   oxyCODONE-acetaminophen 5-325 MG tablet Commonly known as:  PERCOCET/ROXICET   polyethylene glycol packet Commonly known as:  MIRALAX / GLYCOLAX   senna-docusate 8.6-50 MG tablet Commonly known as:  Senokot-S   silver nitrate applicators 75-25 % applicator     TAKE these medications   acetaminophen 500 MG tablet Commonly known as:  TYLENOL Take 500 mg by mouth every 6 (  six) hours as needed for mild pain. What changed:  Another medication with the same name was removed. Continue taking this medication, and follow the directions you see here.   aspirin 81 MG EC tablet Take 1 tablet (81 mg total) by mouth daily.   atorvastatin 80 MG tablet Commonly known as:  LIPITOR Take 1 tablet (80 mg total) by mouth daily.   calcium carbonate 1250 (500 Ca) MG tablet Commonly known as:  OS-CAL - dosed in mg of elemental calcium Take 1 tablet (500 mg of elemental calcium total) by mouth 3 (three) times daily with meals.   collagenase ointment Commonly known as:  SANTYL Apply 1 application topically daily.   diltiazem 120 MG tablet Commonly known as:  CARDIZEM Take 1 tablet (120 mg total) by mouth every 6 (six) hours.   feeding supplement (GLUCERNA SHAKE) Liqd Take 237 mLs by mouth 3 (three) times daily  between meals.   feeding supplement (PRO-STAT SUGAR FREE 64) Liqd Take 30 mLs by mouth 3 (three) times daily with meals. What changed:  when to take this   ferrous sulfate 325 (65 FE) MG tablet Take 1 tablet (325 mg total) by mouth 2 (two) times daily with a meal.   gabapentin 300 MG capsule Commonly known as:  NEURONTIN Take 2 capsules (600 mg total) by mouth 2 (two) times daily.   hydrocerin Crea Apply 1 application topically 3 (three) times daily. legs   insulin detemir 100 UNIT/ML injection Commonly known as:  LEVEMIR Inject 10 Units into the skin 2 (two) times daily.   lidocaine 4 % cream Commonly known as:  LMX Apply topically daily as needed (during hydro).   loperamide 2 MG capsule Commonly known as:  IMODIUM Take 1 capsule (2 mg total) by mouth every 6 (six) hours as needed for diarrhea or loose stools.   meropenem 1 g in sodium chloride 0.9 % 100 mL Inject 1 g into the vein every 8 (eight) hours.   metoprolol succinate 50 MG 24 hr tablet Commonly known as:  TOPROL-XL Take 50 mg by mouth daily. Take with or immediately following a meal.   multivitamin with minerals Tabs tablet Take 1 tablet by mouth daily.   ondansetron 4 MG tablet Commonly known as:  ZOFRAN Take 4 mg by mouth every 8 (eight) hours as needed for nausea or vomiting.   oxyCODONE 15 MG immediate release tablet Commonly known as:  ROXICODONE Take 1 tablet (15 mg total) by mouth every 4 (four) hours as needed for pain. Hold for Sedation What changed:  Another medication with the same name was removed. Continue taking this medication, and follow the directions you see here.   OXYGEN Inhale 2-3 L into the lungs continuous.   potassium & sodium phosphates 280-160-250 MG Pack Commonly known as:  PHOS-NAK Take 1 packet by mouth 4 (four) times daily -  with meals and at bedtime.   promethazine 25 MG/ML injection Commonly known as:  PHENERGAN Inject 12.5 mg into the vein once.   XARELTO 20 MG  Tabs tablet Generic drug:  rivaroxaban TAKE 1 TABLET BY MOUTH EVERY DAY WITH SUPPER      Follow-up Information    [name], MD Follow up.   Specialty:  Internal Medicine Contact information: [address] [place] [address] [phone]          No Known Allergies  Consultations:  PALLIATIVE CARE   Procedures/Studies: Ct Head Wo Contrast  Result Date: 09/09/2017 CLINICAL DATA:  Altered EXAM: CT HEAD WITHOUT CONTRAST TECHNIQUE: Contiguous axial images were obtained from the base of the skull through the vertex without intravenous contrast. COMPARISON:  Head CT dated 09/01/2017 FINDINGS: Brain: Ventricles are stable in size and configuration. Mild chronic small vessel ischemic changes noted within the deep periventricular white matter regions bilaterally. No mass, hemorrhage, edema or other evidence of acute parenchymal abnormality. No extra-axial hemorrhage. Vascular: There are chronic calcified atherosclerotic changes of the large vessels at the skull base. No unexpected hyperdense vessel. Skull: Normal. Negative for fracture or focal lesion. Sinuses/Orbits: Paranasal sinuses are clear. Chronic fluid within the right mastoid air cells. Periorbital and retro-orbital soft tissues are unremarkable. Other: None. IMPRESSION: 1. No acute findings.  No intracranial mass, hemorrhage or edema. 2. Mild chronic small vessel ischemic changes within the white matter. Electronically Signed   By: Bary Richard M.D.   On: 09/09/2017 17:13   Ct Head Wo Contrast  Result Date: 09/01/2017 CLINICAL DATA:  Altered level of consciousness and difficulty the speaking EXAM: CT HEAD WITHOUT CONTRAST TECHNIQUE: Contiguous axial images were obtained from the base of the skull through the vertex without intravenous contrast. COMPARISON:  12/23/2015 FINDINGS: Brain: Mild atrophic changes and chronic white matter ischemic changes are again seen. No findings to suggest acute hemorrhage, acute  infarction or space-occupying mass lesion are noted. Vascular: No hyperdense vessel or unexpected calcification. Skull: Normal. Negative for fracture or focal lesion. Sinuses/Orbits: No acute finding. Other: None. IMPRESSION: Chronic atrophic and ischemic changes without acute abnormality. Electronically Signed   By: Alcide Clever M.D.   On: 09/01/2017 07:41   Dg Chest Port 1 View  Result Date: 09/11/2017 CLINICAL DATA:  Tachycardia. EXAM: PORTABLE CHEST 1 VIEW COMPARISON:  09/07/2017 FINDINGS: Cardiac silhouette is mildly enlarged. No mediastinal or hilar masses. There are prominent bronchovascular markings, stable. No convincing pulmonary edema. No lung consolidation to suggest pneumonia. No visible pleural effusion.  No pneumothorax. Skeletal structures are grossly intact. IMPRESSION: No acute cardiopulmonary disease. Electronically Signed   By: Amie Portland M.D.   On: 09/11/2017 10:27   Dg Chest Port 1 View  Result Date: 09/07/2017 CLINICAL DATA:  Question aspiration EXAM: PORTABLE CHEST 1 VIEW COMPARISON:  09/06/2017 FINDINGS: Cardiomegaly with vascular congestion. Low lung volumes. Mild interstitial prominence could reflect interstitial edema. No effusions or acute bony abnormality. IMPRESSION: Cardiomegaly with vascular congestion and suspected mild interstitial edema. Low lung volumes. Electronically Signed   By: Charlett Nose M.D.   On: 09/07/2017 01:50   Dg Chest Port 1 View  Result Date: 09/06/2017 CLINICAL DATA:  History of CHF.  Lethargy. EXAM: PORTABLE CHEST 1 VIEW COMPARISON:  None. FINDINGS: Stable cardiomegaly with normal pulmonary vascularity. No focal infiltrate. Interval clearing of pulmonary interstitial prominence. Low lung volumes. No pleural effusion or pneumothorax. IMPRESSION: Stable cardiomegaly with normal pulmonary vascularity. Interval clearing of pulmonary interstitial edema. Low lung volumes. Electronically Signed   By: Maisie Fus  Register   On: 09/06/2017 11:55   Dg Chest  Port 1 View  Result Date: 09/04/2017 CLINICAL DATA:  Dyspnea. EXAM: PORTABLE CHEST 1 VIEW COMPARISON:  09/01/2017 FINDINGS: Since the prior exam, there has been an increase in irregular interstitial thickening. There is hazy perihilar opacity, on the right extending into the right lower lung zone. No convincing pleural effusion.  No pneumothorax. Cardiac silhouette is mildly enlarged. IMPRESSION: 1. Congestive heart failure with interstitial and mild central hazy airspace pulmonary edema, worsened when compared to the prior chest radiograph. Electronically Signed   By: Onalee Hua  Ormond M.D.   On: 09/04/2017 10:17   Dg Chest Portable 1 View  Result Date: 09/01/2017 CLINICAL DATA:  Code sepsis. EXAM: PORTABLE CHEST 1 VIEW COMPARISON:  CT chest radiograph September 01, 2017 at 0231 hours FINDINGS: Cardiac silhouette is mildly enlarged even with consideration to this low inspiratory portable examination with crowded vascular markings. Calcified aortic knob. Pulmonary vascular congestion without pleural effusion or focal consolidation. No pneumothorax. Bandlike density LEFT lung base. Soft tissue planes and included osseous structures are unchanged. IMPRESSION: Cardiomegaly and pulmonary vascular congestion with LEFT lung base atelectasis/scarring. Aortic Atherosclerosis (ICD10-I70.0). Electronically Signed   By: Awilda Metro M.D.   On: 09/01/2017 04:46   Dg Chest Portable 1 View  Result Date: 09/01/2017 CLINICAL DATA:  Cough and fever EXAM: PORTABLE CHEST 1 VIEW COMPARISON:  03/14/2017 FINDINGS: Cardiomegaly with mild central congestion. Low lung volumes. Linear atelectasis in the left lower lung. No pleural effusion or focal consolidation. Aortic atherosclerosis. No pneumothorax. IMPRESSION: 1. Cardiomegaly with vascular congestion and atelectasis in the left lower lung 2. No focal pulmonary infiltrate is seen Electronically Signed   By: Jasmine Pang M.D.   On: 09/01/2017 03:08   (Echo, Carotid, EGD,  Colonoscopy, ERCP)    Subjective:   Discharge Exam: Vitals:   09/13/17 0335 09/13/17 0748  BP: (!) 122/56 127/69  Pulse: 78 77  Resp: (!) 23 (!) 23  Temp: 97.9 F (36.6 C) 97.7 F (36.5 C)  SpO2: 99% 100%   Vitals:   09/13/17 0010 09/13/17 0232 09/13/17 0335 09/13/17 0748  BP: (!) 114/59  (!) 122/56 127/69  Pulse: 69  78 77  Resp:   (!) 23 (!) 23  Temp: 97.9 F (36.6 C)  97.9 F (36.6 C) 97.7 F (36.5 C)  TempSrc: Oral  Axillary Oral  SpO2: 99%  99% 100%  Weight:  114.3 kg (252 lb)    Height:        General: Pt is alert, awake, not in acute distress Cardiovascular: RRR, S1/S2 +, no rubs, no gallops Respiratory: CTA bilaterally, no wheezing, no rhonchi Abdominal: Soft, NT, ND, bowel sounds + Extremities: no edema, no cyanosis    The results of significant diagnostics from this hospitalization (including imaging, microbiology, ancillary and laboratory) are listed below for reference.     Microbiology: Recent Results (from the past 240 hour(s))  C difficile quick scan w PCR reflex     Status: None   Collection Time: 09/04/17 10:00 AM  Result Value Ref Range Status   C Diff antigen NEGATIVE NEGATIVE Final   C Diff toxin NEGATIVE NEGATIVE Final   C Diff interpretation No C. difficile detected.  Final    Comment: Performed at [facility], [address] [place], [place] [address]  Culture, blood (routine x 2)     Status: Abnormal   Collection Time: 09/04/17  2:10 PM  Result Value Ref Range Status   Specimen Description BLOOD RIGHT HAND  Final   Special Requests IN PEDIATRIC BOTTLE Blood Culture adequate volume  Final   Culture  Setup Time   Final    IN PEDIATRIC BOTTLE GRAM POSITIVE COCCI CRITICAL RESULT CALLED TO, READ BACK BY AND VERIFIED WITH: Oneta Rack AT 1652 09/05/17 BY L BENFIELD Performed at [facility], [address] [place], [place] [address]    Culture STAPHYLOCOCCUS SPECIES (COAGULASE NEGATIVE) (A)  Final   Report Status  09/07/2017 FINAL  Final   Organism ID, Bacteria STAPHYLOCOCCUS SPECIES (COAGULASE NEGATIVE)  Final  Susceptibility   Staphylococcus species (coagulase negative) - MIC*    CIPROFLOXACIN >=8 RESISTANT Resistant     ERYTHROMYCIN 4 INTERMEDIATE Intermediate     GENTAMICIN <=0.5 SENSITIVE Sensitive     OXACILLIN >=4 RESISTANT Resistant     TETRACYCLINE <=1 SENSITIVE Sensitive     VANCOMYCIN 1 SENSITIVE Sensitive     TRIMETH/SULFA <=10 SENSITIVE Sensitive     CLINDAMYCIN RESISTANT Resistant     RIFAMPIN <=0.5 SENSITIVE Sensitive     Inducible Clindamycin POSITIVE Resistant     * STAPHYLOCOCCUS SPECIES (COAGULASE NEGATIVE)  Blood Culture ID Panel (Reflexed)     Status: Abnormal   Collection Time: 09/04/17  2:10 PM  Result Value Ref Range Status   Enterococcus species NOT DETECTED NOT DETECTED Final   Listeria monocytogenes NOT DETECTED NOT DETECTED Final   Staphylococcus species NOT DETECTED NOT DETECTED Final   Staphylococcus aureus NOT DETECTED NOT DETECTED Final   Streptococcus species NOT DETECTED NOT DETECTED Final   Streptococcus agalactiae NOT DETECTED NOT DETECTED Final   Streptococcus pneumoniae NOT DETECTED NOT DETECTED Final   Streptococcus pyogenes NOT DETECTED NOT DETECTED Final   Acinetobacter baumannii NOT DETECTED NOT DETECTED Final   Enterobacteriaceae species DETECTED (A) NOT DETECTED Final    Comment: Enterobacteriaceae represent a large family of gram negative bacteria, not a single organism. Refer to culture for further identification. CRITICAL RESULT CALLED TO, READ BACK BY AND VERIFIED WITH: M BELL,PHARMD AT 1652 09/05/17 BY L BENFIELD    Enterobacter cloacae complex NOT DETECTED NOT DETECTED Final   Escherichia coli NOT DETECTED NOT DETECTED Final   Klebsiella oxytoca NOT DETECTED NOT DETECTED Final   Klebsiella pneumoniae NOT DETECTED NOT DETECTED Final   Proteus species NOT DETECTED NOT DETECTED Final   Serratia marcescens NOT DETECTED NOT DETECTED Final    Carbapenem resistance NOT DETECTED NOT DETECTED Final   Haemophilus influenzae NOT DETECTED NOT DETECTED Final   Neisseria meningitidis NOT DETECTED NOT DETECTED Final   Pseudomonas aeruginosa NOT DETECTED NOT DETECTED Final   Candida albicans NOT DETECTED NOT DETECTED Final   Candida glabrata NOT DETECTED NOT DETECTED Final   Candida krusei NOT DETECTED NOT DETECTED Final   Candida parapsilosis NOT DETECTED NOT DETECTED Final   Candida tropicalis NOT DETECTED NOT DETECTED Final    Comment: Performed at [facility], [address], [place], [place] [address]  Culture, blood (routine x 2)     Status: Abnormal   Collection Time: 09/04/17  2:14 PM  Result Value Ref Range Status   Specimen Description BLOOD RIGHT THUMB  Final   Special Requests IN PEDIATRIC BOTTLE Blood Culture adequate volume  Final   Culture  Setup Time   Final    GRAM POSITIVE COCCI IN PEDIATRIC BOTTLE CRITICAL VALUE NOTED.  VALUE IS CONSISTENT WITH PREVIOUSLY REPORTED AND CALLED VALUE.    Culture (A)  Final    STAPHYLOCOCCUS SPECIES (COAGULASE NEGATIVE) SUSCEPTIBILITIES PERFORMED ON PREVIOUS CULTURE WITHIN THE LAST 5 DAYS. Performed at [facility], [address] [place], [place] [address]    Report Status 09/07/2017 FINAL  Final  Culture, blood (routine x 2)     Status: None   Collection Time: 09/06/17 11:42 AM  Result Value Ref Range Status   Specimen Description BLOOD LEFT ANTECUBITAL  Final   Special Requests   Final    IN BOTH AEROBIC AND ANAEROBIC BOTTLES Blood Culture adequate volume   Culture   Final    NO GROWTH 5 DAYS  Performed at [facility], [address], [place], [place] [address]    Report Status 09/11/2017 FINAL  Final  Culture, blood (routine x 2)     Status: None   Collection Time: 09/06/17 11:42 AM  Result Value Ref Range Status   Specimen Description BLOOD BLOOD RIGHT FOREARM  Final   Special Requests   Final    IN BOTH AEROBIC AND ANAEROBIC BOTTLES Blood Culture  adequate volume   Culture   Final    NO GROWTH 5 DAYS Performed at [facility], [address] [place], [place] [address]    Report Status 09/11/2017 FINAL  Final  Culture, blood (routine x 2)     Status: None (Preliminary result)   Collection Time: 09/09/17 12:10 PM  Result Value Ref Range Status   Specimen Description BLOOD LEFT HAND  Final   Special Requests IN PEDIATRIC BOTTLE Blood Culture adequate volume  Final   Culture   Final    NO GROWTH 3 DAYS Performed at [facility], [address], [place], [place] [address]    Report Status PENDING  Incomplete  Culture, blood (routine x 2)     Status: None (Preliminary result)   Collection Time: 09/09/17 12:17 PM  Result Value Ref Range Status   Specimen Description BLOOD RIGHT ANTECUBITAL  Final   Special Requests IN PEDIATRIC BOTTLE Blood Culture adequate volume  Final   Culture   Final    NO GROWTH 3 DAYS Performed at [facility], [address], [place], [place] [address]    Report Status PENDING  Incomplete  Culture, blood (routine x 2)     Status: None (Preliminary result)   Collection Time: 09/10/17 10:03 AM  Result Value Ref Range Status   Specimen Description BLOOD RIGHT ANTECUBITAL  Final   Special Requests IN PEDIATRIC BOTTLE Blood Culture adequate volume  Final   Culture   Final    NO GROWTH 2 DAYS Performed at [facility], [address], [place], [place] [address]    Report Status PENDING  Incomplete  Culture, blood (routine x 2)     Status: None (Preliminary result)   Collection Time: 09/10/17 10:03 AM  Result Value Ref Range Status   Specimen Description BLOOD LEFT HAND  Final   Special Requests IN PEDIATRIC BOTTLE Blood Culture adequate volume  Final   Culture   Final    NO GROWTH 2 DAYS Performed at [facility], [address], [place], [place] [address]    Report Status PENDING  Incomplete     Labs: BNP (last 3 results) Recent Labs    01/22/17 0509 03/14/17 0412  09/01/17 0237  BNP 183.2* 414.8* 1,401.6*   Basic Metabolic Panel: Recent Labs  Lab 09/06/17 1717 09/07/17 0302 09/08/17 0208 09/09/17 1218 09/10/17 0204 09/10/17 1003 09/12/17 0306 09/13/17 0327  NA 147* 147* 147* 148* 148*  --  148* 142  K 3.1* 3.1* 3.0* 3.9 4.4  --  4.5 3.8  CL 107 105 105 115* 118*  --  119* 115*  CO2 28 30 30 23 22   --  19* 20*  GLUCOSE 181* 252* 164* 160* 182*  --  179* 150*  BUN 5* <5* <5* <5* <5*  --  9 10  CREATININE 1.30* 1.31* 1.24 1.18 1.10  --  1.03 0.96  CALCIUM 7.7* 8.0* 7.7* 7.5* 7.5*  --  8.1* 8.0*  MG 1.6* 1.9  --   --   --  1.7  --  1.9  PHOS 1.7* 4.1  --   --   --  2.5  --  3.7   Liver Function Tests: No results for input(s): AST, ALT, ALKPHOS, BILITOT, PROT, ALBUMIN in the last 168 hours. No results for input(s): LIPASE, AMYLASE in the last 168 hours. Recent Labs  Lab 09/08/17 1217  AMMONIA 28   CBC: Recent Labs  Lab 09/09/17 0222 09/10/17 0204 09/11/17 0325 09/12/17 0306 09/13/17 0327 09/13/17 0742  WBC 10.5 11.6* 10.4 9.9 9.7  --   NEUTROABS 5.2 5.9 5.0 4.9 4.7  --   HGB 8.2* 8.2* 9.3* 9.2* 7.7* 7.7*  HCT 28.5* 29.1* 32.1* 32.1* 27.0* 26.5*  MCV 72.7* 73.7* 74.1* 74.8* 73.8*  --   PLT 401* 419* 384 391 353  --    Cardiac Enzymes: No results for input(s): CKTOTAL, CKMB, CKMBINDEX, TROPONINI in the last 168 hours. BNP: Invalid input(s): POCBNP CBG: Recent Labs  Lab 09/12/17 0803 09/12/17 1135 09/12/17 1614 09/12/17 2107 09/13/17 0747  GLUCAP 166* 213* 129* 130* 175*   D-Dimer No results for input(s): DDIMER in the last 72 hours. Hgb A1c No results for input(s): HGBA1C in the last 72 hours. Lipid Profile No results for input(s): CHOL, HDL, LDLCALC, TRIG, CHOLHDL, LDLDIRECT in the last 72 hours. Thyroid function studies No results for input(s): TSH, T4TOTAL, T3FREE, THYROIDAB in the last 72 hours.  Invalid input(s): FREET3 Anemia work up No results for input(s): VITAMINB12, FOLATE, FERRITIN, TIBC, IRON,  RETICCTPCT in the last 72 hours. Urinalysis    Component Value Date/Time   COLORURINE AMBER (A) 09/01/2017 0237   APPEARANCEUR CLOUDY (A) 09/01/2017 0237   LABSPEC 1.006 09/01/2017 0237   PHURINE 7.0 09/01/2017 0237   GLUCOSEU NEGATIVE 09/01/2017 0237   HGBUR MODERATE (A) 09/01/2017 0237   BILIRUBINUR NEGATIVE 09/01/2017 0237   KETONESUR NEGATIVE 09/01/2017 0237   PROTEINUR 100 (A) 09/01/2017 0237   UROBILINOGEN 1.0 11/27/2014 0755   NITRITE POSITIVE (A) 09/01/2017 0237   LEUKOCYTESUR LARGE (A) 09/01/2017 0237   Sepsis Labs Invalid input(s): PROCALCITONIN,  WBC,  LACTICIDVEN Microbiology Recent Results (from the past 240 hour(s))  C difficile quick scan w PCR reflex     Status: None   Collection Time: 09/04/17 10:00 AM  Result Value Ref Range Status   C Diff antigen NEGATIVE NEGATIVE Final   C Diff toxin NEGATIVE NEGATIVE Final   C Diff interpretation No C. difficile detected.  Final    Comment: Performed at [facility], [address], [place], [place] [address]  Culture, blood (routine x 2)     Status: Abnormal   Collection Time: 09/04/17  2:10 PM  Result Value Ref Range Status   Specimen Description BLOOD RIGHT HAND  Final   Special Requests IN PEDIATRIC BOTTLE Blood Culture adequate volume  Final   Culture  Setup Time   Final    IN PEDIATRIC BOTTLE GRAM POSITIVE COCCI CRITICAL RESULT CALLED TO, READ BACK BY AND VERIFIED WITH: Oneta Rack AT 1652 09/05/17 BY L BENFIELD Performed at [facility], [address] [place], [place] [address]    Culture STAPHYLOCOCCUS SPECIES (COAGULASE NEGATIVE) (A)  Final   Report Status 09/07/2017 FINAL  Final   Organism ID, Bacteria STAPHYLOCOCCUS SPECIES (COAGULASE NEGATIVE)  Final      Susceptibility   Staphylococcus species (coagulase negative) - MIC*    CIPROFLOXACIN >=8 RESISTANT Resistant     ERYTHROMYCIN 4 INTERMEDIATE Intermediate     GENTAMICIN <=0.5 SENSITIVE  Sensitive     OXACILLIN >=4 RESISTANT Resistant      TETRACYCLINE <=1 SENSITIVE Sensitive     VANCOMYCIN 1 SENSITIVE Sensitive     TRIMETH/SULFA <=10 SENSITIVE Sensitive     CLINDAMYCIN RESISTANT Resistant     RIFAMPIN <=0.5 SENSITIVE Sensitive     Inducible Clindamycin POSITIVE Resistant     * STAPHYLOCOCCUS SPECIES (COAGULASE NEGATIVE)  Blood Culture ID Panel (Reflexed)     Status: Abnormal   Collection Time: 09/04/17  2:10 PM  Result Value Ref Range Status   Enterococcus species NOT DETECTED NOT DETECTED Final   Listeria monocytogenes NOT DETECTED NOT DETECTED Final   Staphylococcus species NOT DETECTED NOT DETECTED Final   Staphylococcus aureus NOT DETECTED NOT DETECTED Final   Streptococcus species NOT DETECTED NOT DETECTED Final   Streptococcus agalactiae NOT DETECTED NOT DETECTED Final   Streptococcus pneumoniae NOT DETECTED NOT DETECTED Final   Streptococcus pyogenes NOT DETECTED NOT DETECTED Final   Acinetobacter baumannii NOT DETECTED NOT DETECTED Final   Enterobacteriaceae species DETECTED (A) NOT DETECTED Final    Comment: Enterobacteriaceae represent a large family of gram negative bacteria, not a single organism. Refer to culture for further identification. CRITICAL RESULT CALLED TO, READ BACK BY AND VERIFIED WITH: M BELL,PHARMD AT 1652 09/05/17 BY L BENFIELD    Enterobacter cloacae complex NOT DETECTED NOT DETECTED Final   Escherichia coli NOT DETECTED NOT DETECTED Final   Klebsiella oxytoca NOT DETECTED NOT DETECTED Final   Klebsiella pneumoniae NOT DETECTED NOT DETECTED Final   Proteus species NOT DETECTED NOT DETECTED Final   Serratia marcescens NOT DETECTED NOT DETECTED Final   Carbapenem resistance NOT DETECTED NOT DETECTED Final   Haemophilus influenzae NOT DETECTED NOT DETECTED Final   Neisseria meningitidis NOT DETECTED NOT DETECTED Final   Pseudomonas aeruginosa NOT DETECTED NOT DETECTED Final   Candida albicans NOT DETECTED NOT DETECTED Final   Candida glabrata NOT DETECTED NOT DETECTED Final   Candida krusei  NOT DETECTED NOT DETECTED Final   Candida parapsilosis NOT DETECTED NOT DETECTED Final   Candida tropicalis NOT DETECTED NOT DETECTED Final    Comment: Performed at [facility], [address] [place], [place] [address]  Culture, blood (routine x 2)     Status: Abnormal   Collection Time: 09/04/17  2:14 PM  Result Value Ref Range Status   Specimen Description BLOOD RIGHT THUMB  Final   Special Requests IN PEDIATRIC BOTTLE Blood Culture adequate volume  Final   Culture  Setup Time   Final    GRAM POSITIVE COCCI IN PEDIATRIC BOTTLE CRITICAL VALUE NOTED.  VALUE IS CONSISTENT WITH PREVIOUSLY REPORTED AND CALLED VALUE.    Culture (A)  Final    STAPHYLOCOCCUS SPECIES (COAGULASE NEGATIVE) SUSCEPTIBILITIES PERFORMED ON PREVIOUS CULTURE WITHIN THE LAST 5 DAYS. Performed at [facility], [address], [place], [place] [address]    Report Status 09/07/2017 FINAL  Final  Culture, blood (routine x 2)     Status: None   Collection Time: 09/06/17 11:42 AM  Result Value Ref Range Status   Specimen Description BLOOD LEFT ANTECUBITAL  Final   Special Requests   Final    IN BOTH AEROBIC AND ANAEROBIC BOTTLES Blood Culture adequate volume   Culture   Final    NO GROWTH 5 DAYS Performed at [facility], [address] [place], [place] [address]    Report Status 09/11/2017 FINAL  Final  Culture, blood (routine x 2)  Status: None   Collection Time: 09/06/17 11:42 AM  Result Value Ref Range Status   Specimen Description BLOOD BLOOD RIGHT FOREARM  Final   Special Requests   Final    IN BOTH AEROBIC AND ANAEROBIC BOTTLES Blood Culture adequate volume   Culture   Final    NO GROWTH 5 DAYS Performed at [facility], [address] [place], [place] [address]    Report Status 09/11/2017 FINAL  Final  Culture, blood (routine x 2)     Status: None (Preliminary result)   Collection Time: 09/09/17 12:10 PM  Result Value Ref Range Status   Specimen Description BLOOD LEFT  HAND  Final   Special Requests IN PEDIATRIC BOTTLE Blood Culture adequate volume  Final   Culture   Final    NO GROWTH 3 DAYS Performed at [facility], [address] [place], [place] [address]    Report Status PENDING  Incomplete  Culture, blood (routine x 2)     Status: None (Preliminary result)   Collection Time: 09/09/17 12:17 PM  Result Value Ref Range Status   Specimen Description BLOOD RIGHT ANTECUBITAL  Final   Special Requests IN PEDIATRIC BOTTLE Blood Culture adequate volume  Final   Culture   Final    NO GROWTH 3 DAYS Performed at [facility], [address] [place], [place] [address]    Report Status PENDING  Incomplete  Culture, blood (routine x 2)     Status: None (Preliminary result)   Collection Time: 09/10/17 10:03 AM  Result Value Ref Range Status   Specimen Description BLOOD RIGHT ANTECUBITAL  Final   Special Requests IN PEDIATRIC BOTTLE Blood Culture adequate volume  Final   Culture   Final    NO GROWTH 2 DAYS Performed at [facility], [address], [place], [place] [address]    Report Status PENDING  Incomplete  Culture, blood (routine x 2)     Status: None (Preliminary result)   Collection Time: 09/10/17 10:03 AM  Result Value Ref Range Status   Specimen Description BLOOD LEFT HAND  Final   Special Requests IN PEDIATRIC BOTTLE Blood Culture adequate volume  Final   Culture   Final    NO GROWTH 2 DAYS Performed at [facility], [address] [place], [place] [address]    Report Status PENDING  Incomplete     Time coordinating discharge: Over 30 minutes  SIGNED:   Alwyn Ren, MD  [name] 09/13/2017, 10:06 AM   If 7PM-7AM, please contact night-coverage [email or web address] Password [email or web address]

## 2017-09-13 NOTE — Progress Notes (Signed)
PHARMACY CONSULT NOTE FOR:  OUTPATIENT  PARENTERAL ANTIBIOTIC THERAPY (OPAT)  Indication: Bacteremia Regimen: Meropenem 1 gram every 8 hours End date: 09/16/2017  IV antibiotic discharge orders are pended. To discharging provider:  please sign these orders via discharge navigator,  Select New Orders & click on the button choice - Manage This Unsigned Work.     Thank you for allowing pharmacy to be a part of this patient's care.  Girard Cooter, PharmD Clinical Pharmacist  Pager: [phone] Clinical Phone for 09/13/2017 until 3:30pm: [phone] If after 3:30pm, please call main pharmacy at [phone] 09/13/2017, 10:13 AM

## 2017-09-13 NOTE — Consult Note (Signed)
   [facility] CM Inpatient Consult   09/13/2017  [name] 1951-02-22 [id number]  Update/Follow up:  Chart reviewed for disposition. Chart review reveals patient's disposition is for [facility] where the patient remains a  resident. No community follow up needs noted.    For questions, please contact:  Charlesetta Shanks, RN BSN CCM [facility]  [phone] business mobile phone Toll free office [phone]

## 2017-09-13 NOTE — NC FL2 (Signed)
[place] MEDICAID FL2 LEVEL OF CARE SCREENING TOOL     IDENTIFICATION  Patient Name: [name] Birthdate: 1951/03/11 Sex: male Admission Date (Current Location): 09/01/2017  [facility] and [place] Number:  Producer, television/film/video and Address:  [facility], [address] [place], [place] [address]      Provider Number: [id number]  Attending Physician Name and Address:  Alwyn Ren, MD  Relative Name and Phone Number:       Current Level of Care: Hospital Recommended Level of Care: Skilled Nursing Facility Prior Approval Number:    Date Approved/Denied:   PASRR Number: [id number]  Discharge Plan: SNF    Current Diagnoses: Patient Active Problem List   Diagnosis Date Noted  . Acute encephalopathy   . Palliative care encounter   . UTI (urinary tract infection) 09/01/2017  . Acute metabolic encephalopathy 09/01/2017  . S/P AKA (above knee amputation) unilateral, right (HCC) 09/01/2017  . Atrial fibrillation with rapid ventricular response (HCC)   . Hyponatremia 04/11/2017  . ARF (acute renal failure) (HCC) 04/11/2017  . Pressure injury of skin 03/24/2017  . Below knee amputation status, right (HCC) 03/23/2017  . DKA (diabetic ketoacidoses) (HCC) 03/14/2017  . Elevated troponin 03/14/2017  . Diabetic polyneuropathy associated with type 2 diabetes mellitus (HCC)   . Osteoarthritis of both knees   . Diabetic nephropathy associated with type 2 diabetes mellitus (HCC)   . Bilateral lower leg cellulitis 01/22/2017  . Venous stasis 08/06/2016  . At high risk for falls 07/21/2016  . CHF exacerbation (HCC) 06/10/2016  . Lactic acidosis 06/09/2016  . Syncope and collapse 03/05/2016  . Acute on chronic renal insufficiency 03/05/2016  . Obesity 12/30/2015  . Scrotal swelling   . Foot fracture   . 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 Lower GI bleed   . Pressure ulcer 12/24/2015  . Acute kidney injury (HCC) 12/23/2015   . Back pain 12/23/2015  . Fall   . Metatarsal fracture   . Traumatic rhabdomyolysis (HCC)   . Hyperkalemia   . Lisfranc dislocation   . Hyperglycemia   . Sepsis, unspecified organism (HCC) 09/08/2015  . Scrotal wall abscess 09/08/2015  . AKI (acute kidney injury) (HCC) 09/08/2015  . Chest wall pain   . On amiodarone therapy 12/08/2014  . Respiratory failure with hypoxia (HCC) 12/04/2014  . Hypoxia 12/04/2014  . Acute on chronic respiratory failure with hypoxia (HCC)   . Atrial fibrillation with RVR (HCC) 09/20/2014  . Acute on chronic diastolic CHF (congestive heart failure) (HCC) 09/19/2014  . Acute respiratory failure (HCC) 09/19/2014  . Abnormal nuclear cardiac imaging test 02/12/2014  . Dyspnea 02/12/2014  . Chronic anticoagulation- Coumadin 02/12/2014  . CKD (chronic kidney disease) stage 3, GFR 30-59 ml/min (HCC) 02/12/2014  . Chronic diastolic congestive heart failure (HCC) 02/11/2014  . Encounter for therapeutic drug monitoring 08/03/2013  . Pilonidal cyst 01/25/2013  . Perirectal cellulitis 09/06/2012  . Long term (current) use of anticoagulants 11/18/2011  . Obstructive sleep apnea- unable to tolerate c-pap 08/04/2011  . CAD S/P percutaneous coronary angioplasty 08/04/2011  . ANEMIA-IRON DEFICIENCY 01/06/2009  . OTHER AND UNSPECIFIED COAGULATION DEFECTS 01/06/2009  . ANEMIA 01/02/2009  . CARDIOVASCULAR FUNCTION STUDY, ABNORMAL 01/02/2009  . Essential hypertension, benign 10/22/2008  . Atrial fibrillation 10/22/2008  . Hyperlipidemia 09/23/2008  . HYPOKALEMIA 09/23/2008  . Obesity, Class III, BMI 40-49.9 (morbid obesity) (HCC) 09/23/2008  . CELLULITIS AND ABSCESS OF LEG EXCEPT FOOT 09/23/2008  Orientation RESPIRATION BLADDER Height & Weight     Place, Self  Normal Continent Weight: 252 lb (114.3 kg) Height:  6\' 3"  (190.5 cm)  BEHAVIORAL SYMPTOMS/MOOD NEUROLOGICAL BOWEL NUTRITION STATUS      Incontinent    AMBULATORY STATUS COMMUNICATION OF NEEDS Skin      Verbally(does not tlk much)                         Personal Care Assistance Level of Assistance  Bathing, Feeding, Dressing           Functional Limitations Info  Sight, Hearing, Speech Sight Info: Adequate Hearing Info: Adequate Speech Info: Adequate    SPECIAL CARE FACTORS FREQUENCY  PT (By licensed PT), OT (By licensed OT)     PT Frequency: 2x wk OT Frequency: 2x wk            Contractures Contractures Info: Not present    Additional Factors Info  Allergies, Isolation Precautions Code Status Info: DNR Allergies Info: NKDA     Isolation Precautions Info: MRSA, ESBL     Current Medications (09/13/2017):  This is the current hospital active medication list Current Facility-Administered Medications  Medication Dose Route Frequency Provider Last Rate Last Dose  . 0.45 % sodium chloride infusion   Intravenous Continuous 11/13/2017, MD 100 mL/hr at 09/13/17 0600    . acetaminophen (TYLENOL) tablet 650 mg  650 mg Oral Q6H PRN 11/13/17, MD   650 mg at 09/12/17 2228   Or  . acetaminophen (TYLENOL) suppository 650 mg  650 mg Rectal Q6H PRN 2229, MD   650 mg at 09/02/17 1349  . calcium carbonate (OS-CAL - dosed in mg of elemental calcium) tablet 500 mg of elemental calcium  1 tablet Oral TID WC 09/04/17, MD   500 mg of elemental calcium at 09/13/17 0829  . diltiazem (CARDIZEM) 100 mg in dextrose 5% 11/13/17 (1 mg/mL) infusion  5-15 mg/hr Intravenous Titrated , MD   Stopped at 09/08/17 [phone]  . diltiazem (CARDIZEM) tablet 120 mg  120 mg Oral Q6H 3734, MD   120 mg at 09/13/17 11/13/17  . feeding supplement (ENSURE ENLIVE) (ENSURE ENLIVE) liquid 237 mL  237 mL Oral TID BM 2876, MD   237 mL at 09/12/17 2007  . feeding supplement (PRO-STAT SUGAR FREE 64) liquid 30 mL  30 mL Oral TID BM 2008, MD   30 mL at 09/12/17 2007  . ferrous sulfate tablet 325 mg  325 mg Oral BID WC 2008, MD   325 mg at 09/13/17 11/13/17  . haloperidol lactate (HALDOL) injection 2 mg  2 mg Intravenous Q6H PRN 8115, MD   2 mg at 09/10/17 2106  . insulin aspart (novoLOG) injection 0-9 Units  0-9 Units Subcutaneous TID WC 2107, MD   2 Units at 09/13/17 0827  . loperamide (IMODIUM) capsule 2 mg  2 mg Oral Q6H PRN 11/13/17, MD      . loperamide (IMODIUM) capsule 8 mg  8 mg Oral BID Alwyn Ren, MD   8 mg at 09/12/17 2228  . magnesium oxide (MAG-OX) tablet 400 mg  400 mg Oral BID 2229, MD   400 mg at 09/12/17 2228  . meropenem (MERREM) 1 g in sodium chloride 0.9 % 100 mL IVPB  1 g Intravenous Q8H 2229, MD   Stopped  at 09/13/17 0546  . multivitamin with minerals tablet 1 tablet  1 tablet Oral Daily Briant Cedar, MD   1 tablet at 09/12/17 0934  . ondansetron (ZOFRAN) tablet 4 mg  4 mg Oral Q6H PRN Lorretta Harp, MD       Or  . ondansetron [facility]) injection 4 mg  4 mg Intravenous Q6H PRN Lorretta Harp, MD   4 mg at 09/06/17 2338  . oxyCODONE (Oxy IR/ROXICODONE) immediate release tablet 15 mg  15 mg Oral Once Alwyn Ren, MD      . potassium & sodium phosphates (PHOS-NAK) 280-160-250 MG packet 1 packet  1 packet Oral TID WC & HS Alwyn Ren, MD   1 packet at 09/13/17 [phone]  . rivaroxaban (XARELTO) tablet 20 mg  20 mg Oral Daily Briant Cedar, MD   20 mg at 09/12/17 1800  . traMADol (ULTRAM) tablet 50 mg  50 mg Oral Q6H PRN Alwyn Ren, MD   50 mg at 09/13/17 0401     Discharge Medications: Please see discharge summary for a list of discharge medications.  Relevant Imaging Results:  Relevant Lab Results:   Additional Information SS# [id number] pt will go out with IV antibiotics(merepenem), palliative to follow  Althea Charon, LCSW

## 2017-09-13 NOTE — Progress Notes (Signed)
  Speech Language Pathology Treatment: Dysphagia  Patient Details Name: HAIDEN RAWLINSON MRN: [id number] DOB: 07-28-1950 Today's Date: 09/13/2017 Time: 7517-0017 SLP Time Calculation (min) (ACUTE ONLY): 12 min  Assessment / Plan / Recommendation Clinical Impression  RN stated pt is drinking but not eating which pt agreed stating "I'm not hungry." SLP reiterated importance of trying to eat small amount even if not hungry. Mild-moderately prolonged mastication with solid texture (endentulous). Pt stated he felt he could tolerate regular textures. No s/s aspiration with solids or thin. Asked if he could eat anything what would it be? He stated "oxycodeine". Will advance to Dys 2, continue thin and continue ST.   HPI HPI: 67 year old male admitted 09/01/17 from SNF with fever and AMS. PMH significant for hypertension, hyperlipidemia, diabetes mellitus, atrial fibrillation on Xarelto, OSA not on CPAP, GI bleeding, dCHF, COPD, CKD-3, s/p of right AKA. BSE requested to determine safe po.      SLP Plan  Continue with current plan of care       Recommendations  Diet recommendations: Dysphagia 2 (fine chop);Thin liquid Liquids provided via: Cup;Straw Medication Administration: Crushed with puree Supervision: Patient able to self feed;Intermittent supervision to cue for compensatory strategies Compensations: Minimize environmental distractions;Slow rate;Small sips/bites;Other (Comment) Postural Changes and/or Swallow Maneuvers: Seated upright 90 degrees                Oral Care Recommendations: Oral care BID Follow up Recommendations: 24 hour supervision/assistance;Skilled Nursing facility SLP Visit Diagnosis: Dysphagia, unspecified (R13.10) Plan: Continue with current plan of care                      Royce Macadamia 09/13/2017, 11:02 AM   Breck Coons Lonell Face[employer] [phone]

## 2017-09-13 NOTE — Clinical Social Work Placement (Signed)
   CLINICAL SOCIAL WORK PLACEMENT  NOTE  Date:  09/13/2017  Patient Details  Name: [name] MRN: [id number] Date of Birth: [birth year]  Clinical Social Work is seeking post-discharge placement for this patient at the Skilled  Nursing Facility level of care (*CSW will initial, date and re-position this form in  chart as items are completed):  Yes   Patient/family provided with [place] Clinical Social Work Department's list of facilities offering this level of care within the geographic area requested by the patient (or if unable, by the patient's family).  Yes   Patient/family informed of their freedom to choose among providers that offer the needed level of care, that participate in Medicare, Medicaid or managed care program needed by the patient, have an available bed and are willing to accept the patient.  Yes   Patient/family informed of [facility]'s ownership interest in [facility] and [facility], as well as of the fact that they are under no obligation to receive care at these facilities.  PASRR submitted to EDS on       PASRR number received on       Existing PASRR number confirmed on       FL2 transmitted to all facilities in geographic area requested by pt/family on       FL2 transmitted to all facilities within larger geographic area on       Patient informed that his/her managed care company has contracts with or will negotiate with certain facilities, including the following:        Yes   Patient/family informed of bed offers received.  Patient chooses bed at [facility]     Physician recommends and patient chooses bed at      Patient to be transferred to [facility] on 09/13/17.  Patient to be transferred to facility by [facility]     Patient family notified on 09/13/17 of transfer.  Name of family member notified:  spoke with pt sister and she is in agreement with dc     PHYSICIAN       Additional Comment:     _______________________________________________ Althea Charon, LCSW 09/13/2017, 12:31 PM

## 2017-09-13 NOTE — Progress Notes (Signed)
Clinical Social Worker facilitated patient discharge including contacting patient family and facility to confirm patient discharge plans.  Clinical information faxed to facility and family agreeable with plan.  CSW arranged ambulance transport via [facility] to [facility] .  RN to call [phone] (pt will go in room 115) for report prior to discharge.  Clinical Social Worker will sign off for now as social work intervention is no longer needed. Please consult [place] again if new need arises.  Marrianne Mood, MSW, [employer] [phone]

## 2017-09-13 NOTE — Progress Notes (Signed)
Called report to [facility] Shanda Bumps, RN)  IV and [place]oley to remain in per MD. Patient discharged to [facility] via [place] with belongings and paperwork. Sister notified by social work of transfer.

## 2017-09-14 LAB — CULTURE, BLOOD (ROUTINE X 2)
Culture: NO GROWTH
Culture: NO GROWTH
SPECIAL REQUESTS: ADEQUATE
SPECIAL REQUESTS: ADEQUATE

## 2017-09-15 DIAGNOSIS — M2550 Pain in unspecified joint: Secondary | ICD-10-CM | POA: Diagnosis not present

## 2017-09-15 DIAGNOSIS — L259 Unspecified contact dermatitis, unspecified cause: Secondary | ICD-10-CM | POA: Diagnosis not present

## 2017-09-15 DIAGNOSIS — R2689 Other abnormalities of gait and mobility: Secondary | ICD-10-CM | POA: Diagnosis not present

## 2017-09-15 DIAGNOSIS — M6281 Muscle weakness (generalized): Secondary | ICD-10-CM | POA: Diagnosis not present

## 2017-09-15 DIAGNOSIS — L89154 Pressure ulcer of sacral region, stage 4: Secondary | ICD-10-CM | POA: Diagnosis not present

## 2017-09-15 DIAGNOSIS — G8929 Other chronic pain: Secondary | ICD-10-CM | POA: Diagnosis not present

## 2017-09-15 LAB — CULTURE, BLOOD (ROUTINE X 2)
CULTURE: NO GROWTH
Culture: NO GROWTH
SPECIAL REQUESTS: ADEQUATE
Special Requests: ADEQUATE

## 2017-09-16 DIAGNOSIS — I480 Paroxysmal atrial fibrillation: Secondary | ICD-10-CM | POA: Diagnosis not present

## 2017-09-16 DIAGNOSIS — I10 Essential (primary) hypertension: Secondary | ICD-10-CM | POA: Diagnosis not present

## 2017-09-16 DIAGNOSIS — N390 Urinary tract infection, site not specified: Secondary | ICD-10-CM | POA: Diagnosis not present

## 2017-09-16 DIAGNOSIS — E1122 Type 2 diabetes mellitus with diabetic chronic kidney disease: Secondary | ICD-10-CM | POA: Diagnosis not present

## 2017-09-20 DIAGNOSIS — M6281 Muscle weakness (generalized): Secondary | ICD-10-CM | POA: Diagnosis not present

## 2017-09-20 DIAGNOSIS — M2550 Pain in unspecified joint: Secondary | ICD-10-CM | POA: Diagnosis not present

## 2017-09-20 DIAGNOSIS — G8929 Other chronic pain: Secondary | ICD-10-CM | POA: Diagnosis not present

## 2017-09-20 DIAGNOSIS — R2689 Other abnormalities of gait and mobility: Secondary | ICD-10-CM | POA: Diagnosis not present

## 2017-09-23 DIAGNOSIS — G4733 Obstructive sleep apnea (adult) (pediatric): Secondary | ICD-10-CM | POA: Diagnosis not present

## 2017-09-26 DIAGNOSIS — D519 Vitamin B12 deficiency anemia, unspecified: Secondary | ICD-10-CM | POA: Diagnosis not present

## 2017-09-26 DIAGNOSIS — N390 Urinary tract infection, site not specified: Secondary | ICD-10-CM | POA: Diagnosis not present

## 2017-09-26 DIAGNOSIS — N19 Unspecified kidney failure: Secondary | ICD-10-CM | POA: Diagnosis not present

## 2017-09-26 DIAGNOSIS — Z139 Encounter for screening, unspecified: Secondary | ICD-10-CM | POA: Diagnosis not present

## 2017-09-26 DIAGNOSIS — E119 Type 2 diabetes mellitus without complications: Secondary | ICD-10-CM | POA: Diagnosis not present

## 2017-09-27 DIAGNOSIS — Z5181 Encounter for therapeutic drug level monitoring: Secondary | ICD-10-CM | POA: Diagnosis not present

## 2017-09-27 DIAGNOSIS — E786 Lipoprotein deficiency: Secondary | ICD-10-CM | POA: Diagnosis not present

## 2017-09-27 DIAGNOSIS — R799 Abnormal finding of blood chemistry, unspecified: Secondary | ICD-10-CM | POA: Diagnosis not present

## 2017-09-27 DIAGNOSIS — D72829 Elevated white blood cell count, unspecified: Secondary | ICD-10-CM | POA: Diagnosis not present

## 2017-09-27 DIAGNOSIS — R112 Nausea with vomiting, unspecified: Secondary | ICD-10-CM | POA: Diagnosis not present

## 2017-09-28 DIAGNOSIS — E876 Hypokalemia: Secondary | ICD-10-CM | POA: Diagnosis not present

## 2017-09-28 DIAGNOSIS — R6520 Severe sepsis without septic shock: Secondary | ICD-10-CM | POA: Diagnosis not present

## 2017-09-28 DIAGNOSIS — A419 Sepsis, unspecified organism: Secondary | ICD-10-CM | POA: Diagnosis not present

## 2017-09-28 DIAGNOSIS — E119 Type 2 diabetes mellitus without complications: Secondary | ICD-10-CM | POA: Diagnosis not present

## 2017-09-28 DIAGNOSIS — Z139 Encounter for screening, unspecified: Secondary | ICD-10-CM | POA: Diagnosis not present

## 2017-09-28 DIAGNOSIS — N179 Acute kidney failure, unspecified: Secondary | ICD-10-CM | POA: Diagnosis not present

## 2017-09-29 ENCOUNTER — Other Ambulatory Visit: Payer: Self-pay

## 2017-09-29 ENCOUNTER — Emergency Department (HOSPITAL_COMMUNITY): Payer: Medicare Other

## 2017-09-29 ENCOUNTER — Inpatient Hospital Stay (HOSPITAL_COMMUNITY)
Admission: EM | Admit: 2017-09-29 | Discharge: 2017-10-14 | DRG: 871 | Disposition: A | Discharge status: Skilled Nursing Facility | Payer: Medicare Other | Attending: Family Medicine | Admitting: Family Medicine

## 2017-09-29 ENCOUNTER — Inpatient Hospital Stay (HOSPITAL_COMMUNITY): Payer: Medicare Other

## 2017-09-29 ENCOUNTER — Other Ambulatory Visit (HOSPITAL_COMMUNITY): Payer: Medicaid Other

## 2017-09-29 ENCOUNTER — Encounter (HOSPITAL_COMMUNITY): Payer: Self-pay | Admitting: Emergency Medicine

## 2017-09-29 DIAGNOSIS — R109 Unspecified abdominal pain: Secondary | ICD-10-CM | POA: Diagnosis not present

## 2017-09-29 DIAGNOSIS — A0472 Enterocolitis due to Clostridium difficile, not specified as recurrent: Secondary | ICD-10-CM | POA: Diagnosis not present

## 2017-09-29 DIAGNOSIS — J9690 Respiratory failure, unspecified, unspecified whether with hypoxia or hypercapnia: Secondary | ICD-10-CM

## 2017-09-29 DIAGNOSIS — Z79899 Other long term (current) drug therapy: Secondary | ICD-10-CM

## 2017-09-29 DIAGNOSIS — G9341 Metabolic encephalopathy: Secondary | ICD-10-CM | POA: Diagnosis not present

## 2017-09-29 DIAGNOSIS — L89152 Pressure ulcer of sacral region, stage 2: Secondary | ICD-10-CM | POA: Diagnosis present

## 2017-09-29 DIAGNOSIS — Z9841 Cataract extraction status, right eye: Secondary | ICD-10-CM

## 2017-09-29 DIAGNOSIS — Z87891 Personal history of nicotine dependence: Secondary | ICD-10-CM

## 2017-09-29 DIAGNOSIS — R6520 Severe sepsis without septic shock: Secondary | ICD-10-CM | POA: Diagnosis not present

## 2017-09-29 DIAGNOSIS — N183 Chronic kidney disease, stage 3 unspecified: Secondary | ICD-10-CM | POA: Diagnosis present

## 2017-09-29 DIAGNOSIS — I130 Hypertensive heart and chronic kidney disease with heart failure and stage 1 through stage 4 chronic kidney disease, or unspecified chronic kidney disease: Secondary | ICD-10-CM | POA: Diagnosis present

## 2017-09-29 DIAGNOSIS — N179 Acute kidney failure, unspecified: Secondary | ICD-10-CM

## 2017-09-29 DIAGNOSIS — E43 Unspecified severe protein-calorie malnutrition: Secondary | ICD-10-CM | POA: Diagnosis not present

## 2017-09-29 DIAGNOSIS — Z4659 Encounter for fitting and adjustment of other gastrointestinal appliance and device: Secondary | ICD-10-CM

## 2017-09-29 DIAGNOSIS — R402441 Other coma, without documented Glasgow coma scale score, or with partial score reported, in the field [EMT or ambulance]: Secondary | ICD-10-CM | POA: Diagnosis not present

## 2017-09-29 DIAGNOSIS — E876 Hypokalemia: Secondary | ICD-10-CM | POA: Diagnosis not present

## 2017-09-29 DIAGNOSIS — B952 Enterococcus as the cause of diseases classified elsewhere: Secondary | ICD-10-CM | POA: Diagnosis present

## 2017-09-29 DIAGNOSIS — Z7982 Long term (current) use of aspirin: Secondary | ICD-10-CM

## 2017-09-29 DIAGNOSIS — I2510 Atherosclerotic heart disease of native coronary artery without angina pectoris: Secondary | ICD-10-CM | POA: Diagnosis not present

## 2017-09-29 DIAGNOSIS — Z961 Presence of intraocular lens: Secondary | ICD-10-CM | POA: Diagnosis present

## 2017-09-29 DIAGNOSIS — R6521 Severe sepsis with septic shock: Secondary | ICD-10-CM | POA: Diagnosis not present

## 2017-09-29 DIAGNOSIS — A419 Sepsis, unspecified organism: Principal | ICD-10-CM

## 2017-09-29 DIAGNOSIS — D509 Iron deficiency anemia, unspecified: Secondary | ICD-10-CM | POA: Diagnosis not present

## 2017-09-29 DIAGNOSIS — M545 Low back pain: Secondary | ICD-10-CM | POA: Diagnosis not present

## 2017-09-29 DIAGNOSIS — I10 Essential (primary) hypertension: Secondary | ICD-10-CM | POA: Diagnosis present

## 2017-09-29 DIAGNOSIS — N390 Urinary tract infection, site not specified: Secondary | ICD-10-CM | POA: Diagnosis not present

## 2017-09-29 DIAGNOSIS — E872 Acidosis: Secondary | ICD-10-CM | POA: Diagnosis present

## 2017-09-29 DIAGNOSIS — R531 Weakness: Secondary | ICD-10-CM | POA: Diagnosis not present

## 2017-09-29 DIAGNOSIS — Z1621 Resistance to vancomycin: Secondary | ICD-10-CM | POA: Diagnosis present

## 2017-09-29 DIAGNOSIS — Z9842 Cataract extraction status, left eye: Secondary | ICD-10-CM

## 2017-09-29 DIAGNOSIS — Y95 Nosocomial condition: Secondary | ICD-10-CM | POA: Diagnosis present

## 2017-09-29 DIAGNOSIS — E1140 Type 2 diabetes mellitus with diabetic neuropathy, unspecified: Secondary | ICD-10-CM | POA: Diagnosis present

## 2017-09-29 DIAGNOSIS — D649 Anemia, unspecified: Secondary | ICD-10-CM | POA: Diagnosis not present

## 2017-09-29 DIAGNOSIS — E11649 Type 2 diabetes mellitus with hypoglycemia without coma: Secondary | ICD-10-CM | POA: Diagnosis not present

## 2017-09-29 DIAGNOSIS — B9689 Other specified bacterial agents as the cause of diseases classified elsewhere: Secondary | ICD-10-CM | POA: Diagnosis present

## 2017-09-29 DIAGNOSIS — G8929 Other chronic pain: Secondary | ICD-10-CM | POA: Diagnosis not present

## 2017-09-29 DIAGNOSIS — J690 Pneumonitis due to inhalation of food and vomit: Secondary | ICD-10-CM | POA: Diagnosis not present

## 2017-09-29 DIAGNOSIS — J9601 Acute respiratory failure with hypoxia: Secondary | ICD-10-CM | POA: Diagnosis present

## 2017-09-29 DIAGNOSIS — Z7901 Long term (current) use of anticoagulants: Secondary | ICD-10-CM

## 2017-09-29 DIAGNOSIS — I480 Paroxysmal atrial fibrillation: Secondary | ICD-10-CM | POA: Diagnosis present

## 2017-09-29 DIAGNOSIS — J9621 Acute and chronic respiratory failure with hypoxia: Secondary | ICD-10-CM | POA: Diagnosis not present

## 2017-09-29 DIAGNOSIS — I482 Chronic atrial fibrillation: Secondary | ICD-10-CM | POA: Diagnosis present

## 2017-09-29 DIAGNOSIS — R1110 Vomiting, unspecified: Secondary | ICD-10-CM

## 2017-09-29 DIAGNOSIS — Z794 Long term (current) use of insulin: Secondary | ICD-10-CM

## 2017-09-29 DIAGNOSIS — M19041 Primary osteoarthritis, right hand: Secondary | ICD-10-CM | POA: Diagnosis present

## 2017-09-29 DIAGNOSIS — E11628 Type 2 diabetes mellitus with other skin complications: Secondary | ICD-10-CM | POA: Diagnosis present

## 2017-09-29 DIAGNOSIS — Z79891 Long term (current) use of opiate analgesic: Secondary | ICD-10-CM

## 2017-09-29 DIAGNOSIS — Z4682 Encounter for fitting and adjustment of non-vascular catheter: Secondary | ICD-10-CM | POA: Diagnosis not present

## 2017-09-29 DIAGNOSIS — I5032 Chronic diastolic (congestive) heart failure: Secondary | ICD-10-CM | POA: Diagnosis not present

## 2017-09-29 DIAGNOSIS — Z6837 Body mass index (BMI) 37.0-37.9, adult: Secondary | ICD-10-CM

## 2017-09-29 DIAGNOSIS — R001 Bradycardia, unspecified: Secondary | ICD-10-CM | POA: Diagnosis present

## 2017-09-29 DIAGNOSIS — Z955 Presence of coronary angioplasty implant and graft: Secondary | ICD-10-CM

## 2017-09-29 DIAGNOSIS — N5089 Other specified disorders of the male genital organs: Secondary | ICD-10-CM | POA: Diagnosis present

## 2017-09-29 DIAGNOSIS — E162 Hypoglycemia, unspecified: Secondary | ICD-10-CM

## 2017-09-29 DIAGNOSIS — G9340 Encephalopathy, unspecified: Secondary | ICD-10-CM

## 2017-09-29 DIAGNOSIS — IMO0002 Reserved for concepts with insufficient information to code with codable children: Secondary | ICD-10-CM

## 2017-09-29 DIAGNOSIS — I4891 Unspecified atrial fibrillation: Secondary | ICD-10-CM | POA: Diagnosis present

## 2017-09-29 DIAGNOSIS — M19042 Primary osteoarthritis, left hand: Secondary | ICD-10-CM | POA: Diagnosis present

## 2017-09-29 DIAGNOSIS — R404 Transient alteration of awareness: Secondary | ICD-10-CM | POA: Diagnosis not present

## 2017-09-29 DIAGNOSIS — Z89611 Acquired absence of right leg above knee: Secondary | ICD-10-CM

## 2017-09-29 DIAGNOSIS — K8020 Calculus of gallbladder without cholecystitis without obstruction: Secondary | ICD-10-CM

## 2017-09-29 DIAGNOSIS — J189 Pneumonia, unspecified organism: Secondary | ICD-10-CM | POA: Diagnosis not present

## 2017-09-29 DIAGNOSIS — E2740 Unspecified adrenocortical insufficiency: Secondary | ICD-10-CM | POA: Diagnosis present

## 2017-09-29 DIAGNOSIS — R1031 Right lower quadrant pain: Secondary | ICD-10-CM | POA: Diagnosis not present

## 2017-09-29 DIAGNOSIS — I739 Peripheral vascular disease, unspecified: Secondary | ICD-10-CM | POA: Diagnosis present

## 2017-09-29 DIAGNOSIS — I493 Ventricular premature depolarization: Secondary | ICD-10-CM | POA: Diagnosis not present

## 2017-09-29 DIAGNOSIS — J9691 Respiratory failure, unspecified with hypoxia: Secondary | ICD-10-CM | POA: Diagnosis present

## 2017-09-29 DIAGNOSIS — Z89511 Acquired absence of right leg below knee: Secondary | ICD-10-CM | POA: Diagnosis not present

## 2017-09-29 DIAGNOSIS — K760 Fatty (change of) liver, not elsewhere classified: Secondary | ICD-10-CM | POA: Diagnosis not present

## 2017-09-29 DIAGNOSIS — B999 Unspecified infectious disease: Secondary | ICD-10-CM | POA: Diagnosis not present

## 2017-09-29 DIAGNOSIS — D638 Anemia in other chronic diseases classified elsewhere: Secondary | ICD-10-CM | POA: Diagnosis present

## 2017-09-29 DIAGNOSIS — Z8614 Personal history of Methicillin resistant Staphylococcus aureus infection: Secondary | ICD-10-CM

## 2017-09-29 DIAGNOSIS — E1122 Type 2 diabetes mellitus with diabetic chronic kidney disease: Secondary | ICD-10-CM | POA: Diagnosis present

## 2017-09-29 DIAGNOSIS — G4733 Obstructive sleep apnea (adult) (pediatric): Secondary | ICD-10-CM | POA: Diagnosis present

## 2017-09-29 DIAGNOSIS — E785 Hyperlipidemia, unspecified: Secondary | ICD-10-CM | POA: Diagnosis present

## 2017-09-29 DIAGNOSIS — R0603 Acute respiratory distress: Secondary | ICD-10-CM | POA: Diagnosis not present

## 2017-09-29 DIAGNOSIS — Z452 Encounter for adjustment and management of vascular access device: Secondary | ICD-10-CM | POA: Diagnosis not present

## 2017-09-29 DIAGNOSIS — Z9049 Acquired absence of other specified parts of digestive tract: Secondary | ICD-10-CM

## 2017-09-29 LAB — CBG MONITORING, ED: GLUCOSE-CAPILLARY: 52 mg/dL — AB (ref 65–99)

## 2017-09-29 LAB — COMPREHENSIVE METABOLIC PANEL
ALK PHOS: 339 U/L — AB (ref 38–126)
ALT: 16 U/L — ABNORMAL LOW (ref 17–63)
ANION GAP: 17 — AB (ref 5–15)
AST: 29 U/L (ref 15–41)
Albumin: 1.8 g/dL — ABNORMAL LOW (ref 3.5–5.0)
BUN: 41 mg/dL — ABNORMAL HIGH (ref 6–20)
CALCIUM: 7.4 mg/dL — AB (ref 8.9–10.3)
CO2: 22 mmol/L (ref 22–32)
Chloride: 94 mmol/L — ABNORMAL LOW (ref 101–111)
Creatinine, Ser: 2.03 mg/dL — ABNORMAL HIGH (ref 0.61–1.24)
GFR calc non Af Amer: 33 mL/min — ABNORMAL LOW (ref >60)
GFR, EST AFRICAN AMERICAN: 38 mL/min — AB (ref >60)
Glucose, Bld: 63 mg/dL — ABNORMAL LOW (ref 65–99)
Potassium: <2 mmol/L — CL (ref 3.5–5.1)
SODIUM: 133 mmol/L — AB (ref 135–145)
Total Bilirubin: 0.5 mg/dL (ref 0.3–1.2)
Total Protein: 6.4 g/dL — ABNORMAL LOW (ref 6.5–8.1)

## 2017-09-29 LAB — URINALYSIS, ROUTINE W REFLEX MICROSCOPIC
Bacteria, UA: NONE SEEN
Bilirubin Urine: NEGATIVE
Glucose, UA: NEGATIVE mg/dL
Ketones, ur: NEGATIVE mg/dL
Nitrite: NEGATIVE
PH: 5 (ref 5.0–8.0)
Protein, ur: 100 mg/dL — AB
SPECIFIC GRAVITY, URINE: 1.017 (ref 1.005–1.030)

## 2017-09-29 LAB — CBC WITH DIFFERENTIAL/PLATELET
BASOS ABS: 0 10*3/uL (ref 0.0–0.1)
BASOS PCT: 0 %
EOS ABS: 0.4 10*3/uL (ref 0.0–0.7)
Eosinophils Relative: 1 %
HCT: 30.9 % — ABNORMAL LOW (ref 39.0–52.0)
Hemoglobin: 9.5 g/dL — ABNORMAL LOW (ref 13.0–17.0)
LYMPHS PCT: 3 %
Lymphs Abs: 1.2 10*3/uL (ref 0.7–4.0)
MCH: 22.2 pg — ABNORMAL LOW (ref 26.0–34.0)
MCHC: 30.7 g/dL (ref 30.0–36.0)
MCV: 72.4 fL — AB (ref 78.0–100.0)
Monocytes Absolute: 1.2 10*3/uL — ABNORMAL HIGH (ref 0.1–1.0)
Monocytes Relative: 3 %
NEUTROS PCT: 93 %
Neutro Abs: 36.6 10*3/uL — ABNORMAL HIGH (ref 1.7–7.7)
PLATELETS: 737 10*3/uL — AB (ref 150–400)
RBC: 4.27 MIL/uL (ref 4.22–5.81)
RDW: 21.6 % — ABNORMAL HIGH (ref 11.5–15.5)
WBC: 39.4 10*3/uL — ABNORMAL HIGH (ref 4.0–10.5)

## 2017-09-29 LAB — BASIC METABOLIC PANEL
ANION GAP: 12 (ref 5–15)
ANION GAP: 10 (ref 5–15)
BUN: 39 mg/dL — ABNORMAL HIGH (ref 6–20)
BUN: 39 mg/dL — ABNORMAL HIGH (ref 6–20)
CALCIUM: 6.5 mg/dL — AB (ref 8.9–10.3)
CALCIUM: 6.3 mg/dL — AB (ref 8.9–10.3)
CHLORIDE: 105 mmol/L (ref 101–111)
CO2: 18 mmol/L — AB (ref 22–32)
CO2: 17 mmol/L — AB (ref 22–32)
CREATININE: 1.6 mg/dL — AB (ref 0.61–1.24)
Chloride: 102 mmol/L (ref 101–111)
Creatinine, Ser: 1.7 mg/dL — ABNORMAL HIGH (ref 0.61–1.24)
GFR calc Af Amer: 47 mL/min — ABNORMAL LOW (ref >60)
GFR calc non Af Amer: 44 mL/min — ABNORMAL LOW (ref >60)
GFR, EST AFRICAN AMERICAN: 51 mL/min — AB (ref >60)
GFR, EST NON AFRICAN AMERICAN: 41 mL/min — AB (ref >60)
Glucose, Bld: 218 mg/dL — ABNORMAL HIGH (ref 65–99)
Glucose, Bld: 269 mg/dL — ABNORMAL HIGH (ref 65–99)
Potassium: 2.2 mmol/L — CL (ref 3.5–5.1)
Potassium: 2.4 mmol/L — CL (ref 3.5–5.1)
SODIUM: 132 mmol/L — AB (ref 135–145)
Sodium: 132 mmol/L — ABNORMAL LOW (ref 135–145)

## 2017-09-29 LAB — FIBRINOGEN: Fibrinogen: 389 mg/dL (ref 210–475)

## 2017-09-29 LAB — BLOOD GAS, ARTERIAL
Acid-base deficit: 3.3 mmol/L — ABNORMAL HIGH (ref 0.0–2.0)
BICARBONATE: 20.8 mmol/L (ref 20.0–28.0)
Drawn by: 422461
FIO2: 50
LHR: 18 {breaths}/min
O2 SAT: 85.9 %
PATIENT TEMPERATURE: 98.6
PCO2 ART: 35.9 mmHg (ref 32.0–48.0)
PEEP: 5 cmH2O
PH ART: 7.381 (ref 7.350–7.450)
PO2 ART: 57.7 mmHg — AB (ref 83.0–108.0)
VT: 620 mL

## 2017-09-29 LAB — MRSA PCR SCREENING: MRSA BY PCR: NEGATIVE

## 2017-09-29 LAB — LACTIC ACID, PLASMA
Lactic Acid, Venous: 3.7 mmol/L (ref 0.5–1.9)
Lactic Acid, Venous: 3.1 mmol/L (ref 0.5–1.9)

## 2017-09-29 LAB — I-STAT CG4 LACTIC ACID, ED: LACTIC ACID, VENOUS: 4.47 mmol/L — AB (ref 0.5–1.9)

## 2017-09-29 LAB — CORTISOL: CORTISOL PLASMA: 34.7 ug/dL

## 2017-09-29 LAB — PROTIME-INR
INR: 3.04
PROTHROMBIN TIME: 31.3 s — AB (ref 11.4–15.2)

## 2017-09-29 LAB — MAGNESIUM
MAGNESIUM: 1.9 mg/dL (ref 1.7–2.4)
Magnesium: 1.7 mg/dL (ref 1.7–2.4)

## 2017-09-29 LAB — GLUCOSE, CAPILLARY
GLUCOSE-CAPILLARY: 243 mg/dL — AB (ref 65–99)
GLUCOSE-CAPILLARY: 250 mg/dL — AB (ref 65–99)
GLUCOSE-CAPILLARY: 249 mg/dL — AB (ref 65–99)
Glucose-Capillary: 194 mg/dL — ABNORMAL HIGH (ref 65–99)
Glucose-Capillary: 303 mg/dL — ABNORMAL HIGH (ref 65–99)

## 2017-09-29 LAB — TROPONIN I: Troponin I: 0.04 ng/mL (ref <0.03)

## 2017-09-29 LAB — PHOSPHORUS: Phosphorus: 2.7 mg/dL (ref 2.5–4.6)

## 2017-09-29 LAB — PROCALCITONIN: PROCALCITONIN: 5.95 ng/mL

## 2017-09-29 MED ORDER — HYDROCORTISONE NA SUCCINATE PF 100 MG IJ SOLR
50.0000 mg | Freq: Four times a day (QID) | INTRAMUSCULAR | Status: DC
  Administered 2017-09-29 – 2017-10-02 (×13): 50 mg via INTRAVENOUS
  Filled 2017-09-29 (×13): qty 2

## 2017-09-29 MED ORDER — GERHARDT'S BUTT CREAM
TOPICAL_CREAM | Freq: Three times a day (TID) | CUTANEOUS | Status: AC
  Administered 2017-09-29 (×2): via TOPICAL
  Administered 2017-09-30: 1 via TOPICAL
  Administered 2017-09-30 – 2017-10-06 (×19): via TOPICAL
  Administered 2017-10-07: 1 via TOPICAL
  Administered 2017-10-07: 10:00:00 via TOPICAL
  Administered 2017-10-07: 1 via TOPICAL
  Administered 2017-10-08 (×2): via TOPICAL
  Administered 2017-10-08: 1 via TOPICAL
  Administered 2017-10-09 – 2017-10-13 (×11): via TOPICAL
  Filled 2017-09-29: qty 1

## 2017-09-29 MED ORDER — POTASSIUM CHLORIDE 10 MEQ/100ML IV SOLN
10.0000 meq | INTRAVENOUS | Status: AC
  Administered 2017-09-29 (×4): 10 meq via INTRAVENOUS
  Filled 2017-09-29 (×4): qty 100

## 2017-09-29 MED ORDER — ALBUTEROL SULFATE (2.5 MG/3ML) 0.083% IN NEBU: 2.5000 mg | INHALATION_SOLUTION | RESPIRATORY_TRACT | Status: DC | PRN

## 2017-09-29 MED ORDER — SODIUM CHLORIDE 0.9 % IV BOLUS (SEPSIS)
1000.0000 mL | Freq: Once | INTRAVENOUS | Status: AC
  Administered 2017-09-29: 1000 mL via INTRAVENOUS

## 2017-09-29 MED ORDER — SODIUM CHLORIDE 0.9 % IV SOLN
1.0000 g | Freq: Three times a day (TID) | INTRAVENOUS | Status: DC
  Administered 2017-09-29 – 2017-10-03 (×14): 1 g via INTRAVENOUS
  Filled 2017-09-29 (×16): qty 1

## 2017-09-29 MED ORDER — COLLAGENASE 250 UNIT/GM EX OINT
TOPICAL_OINTMENT | Freq: Every day | CUTANEOUS | Status: AC
  Administered 2017-09-29: 14:00:00 via TOPICAL
  Administered 2017-09-30: 1 via TOPICAL
  Administered 2017-10-01 – 2017-10-02 (×2): via TOPICAL
  Administered 2017-10-03: 1 via TOPICAL
  Administered 2017-10-04 – 2017-10-12 (×9): via TOPICAL
  Filled 2017-09-29 (×2): qty 90

## 2017-09-29 MED ORDER — INSULIN ASPART 100 UNIT/ML ~~LOC~~ SOLN
2.0000 [IU] | SUBCUTANEOUS | Status: DC
  Administered 2017-09-29: 6 [IU] via SUBCUTANEOUS
  Administered 2017-09-29: 4 [IU] via SUBCUTANEOUS
  Administered 2017-09-29: 6 [IU] via SUBCUTANEOUS

## 2017-09-29 MED ORDER — DEXTROSE 50 % IV SOLN
INTRAVENOUS | Status: AC
  Administered 2017-09-29: 50 mL
  Filled 2017-09-29: qty 50

## 2017-09-29 MED ORDER — NOREPINEPHRINE BITARTRATE 1 MG/ML IV SOLN
0.0000 ug/min | INTRAVENOUS | Status: DC
  Administered 2017-09-29: 30 ug/min via INTRAVENOUS
  Administered 2017-09-29: 50 ug/min via INTRAVENOUS
  Administered 2017-09-30: 3 ug/min via INTRAVENOUS
  Filled 2017-09-29 (×5): qty 16

## 2017-09-29 MED ORDER — SODIUM CHLORIDE 0.9 % IV SOLN
0.0000 ug/h | Freq: Once | INTRAVENOUS | Status: AC
  Administered 2017-09-29: 25 ug/h via INTRAVENOUS
  Filled 2017-09-29: qty 50

## 2017-09-29 MED ORDER — SODIUM CHLORIDE 0.9 % IV SOLN
0.5000 mg/h | Freq: Once | INTRAVENOUS | Status: AC
  Administered 2017-09-29: 0.5 mg/h via INTRAVENOUS
  Filled 2017-09-29: qty 10

## 2017-09-29 MED ORDER — SODIUM CHLORIDE 0.9 % IV SOLN: 250.0000 mL | INTRAVENOUS | Status: DC | PRN

## 2017-09-29 MED ORDER — ACETAMINOPHEN 325 MG PO TABS: 650.0000 mg | ORAL_TABLET | ORAL | Status: DC | PRN

## 2017-09-29 MED ORDER — ONDANSETRON HCL 4 MG/2ML IJ SOLN: 4.0000 mg | Freq: Four times a day (QID) | INTRAMUSCULAR | Status: DC | PRN

## 2017-09-29 MED ORDER — FAMOTIDINE IN NACL 20-0.9 MG/50ML-% IV SOLN: 20.0000 mg | Freq: Two times a day (BID) | INTRAVENOUS | Status: DC

## 2017-09-29 MED ORDER — VANCOMYCIN HCL IN DEXTROSE 1-5 GM/200ML-% IV SOLN: 1000.0000 mg | Freq: Once | INTRAVENOUS | Status: DC

## 2017-09-29 MED ORDER — POTASSIUM CHLORIDE 20 MEQ PO PACK
40.0000 meq | PACK | Freq: Once | ORAL | Status: AC
  Administered 2017-09-29: 40 meq
  Filled 2017-09-29: qty 2

## 2017-09-29 MED ORDER — HYDROCERIN EX CREA
TOPICAL_CREAM | Freq: Two times a day (BID) | CUTANEOUS | Status: AC
  Administered 2017-09-29 (×2): via TOPICAL
  Administered 2017-09-30 (×2): 1 via TOPICAL
  Administered 2017-10-01 – 2017-10-06 (×12): via TOPICAL
  Administered 2017-10-07 (×2): 1 via TOPICAL
  Administered 2017-10-08: 14:00:00 via TOPICAL
  Administered 2017-10-08 – 2017-10-09 (×2): 1 via TOPICAL
  Administered 2017-10-09 – 2017-10-11 (×3): via TOPICAL
  Administered 2017-10-11 (×2): 1 via TOPICAL
  Administered 2017-10-12 (×2): via TOPICAL
  Filled 2017-09-29: qty 113

## 2017-09-29 MED ORDER — SODIUM CHLORIDE 0.9 % IV SOLN
INTRAVENOUS | Status: DC
  Administered 2017-09-29 – 2017-10-07 (×5): via INTRAVENOUS

## 2017-09-29 MED ORDER — SODIUM CHLORIDE 0.9 % IV SOLN: 2.0000 g | Freq: Once | INTRAVENOUS | Status: DC

## 2017-09-29 MED ORDER — BUDESONIDE 0.5 MG/2ML IN SUSP
0.5000 mg | Freq: Two times a day (BID) | RESPIRATORY_TRACT | Status: DC
  Administered 2017-09-29: 0.5 mg via RESPIRATORY_TRACT
  Filled 2017-09-29: qty 2

## 2017-09-29 MED ORDER — MIDAZOLAM HCL 2 MG/2ML IJ SOLN
INTRAMUSCULAR | Status: AC
  Administered 2017-09-29: 5 mg
  Filled 2017-09-29: qty 6

## 2017-09-29 MED ORDER — MAGNESIUM SULFATE IN D5W 1-5 GM/100ML-% IV SOLN
1.0000 g | Freq: Once | INTRAVENOUS | Status: AC
  Administered 2017-09-29: 1 g via INTRAVENOUS
  Filled 2017-09-29: qty 100

## 2017-09-29 MED ORDER — FENTANYL CITRATE (PF) 100 MCG/2ML IJ SOLN
50.0000 ug | Freq: Once | INTRAMUSCULAR | Status: AC
  Administered 2017-09-29: 50 ug via INTRAVENOUS

## 2017-09-29 MED ORDER — SODIUM CHLORIDE 0.9 % IV SOLN
INTRAVENOUS | Status: DC
  Administered 2017-09-30: 1.9 [IU]/h via INTRAVENOUS
  Administered 2017-10-02: 2.5 [IU]/h via INTRAVENOUS
  Filled 2017-09-29 (×2): qty 1

## 2017-09-29 MED ORDER — ALBUTEROL SULFATE (2.5 MG/3ML) 0.083% IN NEBU
2.5000 mg | INHALATION_SOLUTION | RESPIRATORY_TRACT | Status: DC
  Administered 2017-09-29 (×3): 2.5 mg via RESPIRATORY_TRACT
  Filled 2017-09-29 (×2): qty 3

## 2017-09-29 MED ORDER — SODIUM CHLORIDE 0.9 % IV BOLUS (SEPSIS)
1000.0000 mL | Freq: Once | INTRAVENOUS | Status: AC
Start: 2017-09-29 — End: 2017-09-29
  Administered 2017-09-29: 1000 mL via INTRAVENOUS

## 2017-09-29 MED ORDER — ENOXAPARIN SODIUM 40 MG/0.4ML ~~LOC~~ SOLN
40.0000 mg | SUBCUTANEOUS | Status: DC
  Administered 2017-09-29: 40 mg via SUBCUTANEOUS
  Filled 2017-09-29 (×2): qty 0.4

## 2017-09-29 MED ORDER — SODIUM CHLORIDE 0.9 % IV SOLN
25.0000 ug/h | INTRAVENOUS | Status: DC
  Administered 2017-09-30: 250 ug/h via INTRAVENOUS
  Administered 2017-10-01: 150 ug/h via INTRAVENOUS
  Administered 2017-10-02: 175 ug/h via INTRAVENOUS
  Filled 2017-09-29 (×4): qty 50

## 2017-09-29 MED ORDER — FENTANYL CITRATE (PF) 100 MCG/2ML IJ SOLN
INTRAMUSCULAR | Status: AC
  Administered 2017-09-29: 50 ug via INTRAVENOUS
  Filled 2017-09-29: qty 2

## 2017-09-29 MED ORDER — SODIUM CHLORIDE 0.9 % IV SOLN
2500.0000 mg | INTRAVENOUS | Status: AC
  Administered 2017-09-29: 2500 mg via INTRAVENOUS
  Filled 2017-09-29: qty 2500

## 2017-09-29 MED ORDER — NOREPINEPHRINE BITARTRATE 1 MG/ML IV SOLN
0.0000 ug/min | Freq: Once | INTRAVENOUS | Status: AC
  Administered 2017-09-29: 2 ug/min via INTRAVENOUS
  Filled 2017-09-29: qty 4

## 2017-09-29 MED ORDER — SODIUM CHLORIDE 0.9 % IV BOLUS (SEPSIS)
500.0000 mL | Freq: Once | INTRAVENOUS | Status: AC
  Administered 2017-09-29: 500 mL via INTRAVENOUS

## 2017-09-29 MED ORDER — SODIUM CHLORIDE 0.9% FLUSH: 3.0000 mL | INTRAVENOUS | Status: DC | PRN

## 2017-09-29 MED ORDER — VANCOMYCIN HCL 10 G IV SOLR
1250.0000 mg | INTRAVENOUS | Status: DC
  Administered 2017-09-30: 1250 mg via INTRAVENOUS
  Filled 2017-09-29: qty 1250

## 2017-09-29 MED ORDER — FAMOTIDINE IN NACL 20-0.9 MG/50ML-% IV SOLN
20.0000 mg | Freq: Two times a day (BID) | INTRAVENOUS | Status: DC
  Administered 2017-09-29 – 2017-09-30 (×4): 20 mg via INTRAVENOUS
  Filled 2017-09-29 (×5): qty 50

## 2017-09-29 MED ORDER — SODIUM CHLORIDE 0.9% FLUSH
3.0000 mL | Freq: Two times a day (BID) | INTRAVENOUS | Status: DC
  Administered 2017-09-29: 3 mL via INTRAVENOUS

## 2017-09-29 MED ORDER — MIDAZOLAM HCL 2 MG/2ML IJ SOLN: 1.0000 mg | INTRAMUSCULAR | Status: DC | PRN

## 2017-09-29 MED ORDER — FENTANYL BOLUS VIA INFUSION
50.0000 ug | INTRAVENOUS | Status: DC | PRN
  Administered 2017-10-01 (×2): 50 ug via INTRAVENOUS
  Filled 2017-09-29: qty 50

## 2017-09-29 MED ORDER — VANCOMYCIN HCL IN DEXTROSE 1-5 GM/200ML-% IV SOLN: 1000.0000 mg | INTRAVENOUS | Status: DC

## 2017-09-29 MED ORDER — SODIUM CHLORIDE 0.9 % IV SOLN: 2.0000 g | INTRAVENOUS | Status: DC

## 2017-09-29 MED ORDER — POTASSIUM CHLORIDE 20 MEQ/15ML (10%) PO SOLN
40.0000 meq | Freq: Once | ORAL | Status: AC
  Administered 2017-09-29: 40 meq
  Filled 2017-09-29: qty 30

## 2017-09-29 MED ORDER — MAGNESIUM SULFATE 2 GM/50ML IV SOLN
2.0000 g | Freq: Once | INTRAVENOUS | Status: AC
  Administered 2017-09-29: 2 g via INTRAVENOUS
  Filled 2017-09-29: qty 50

## 2017-09-29 MED ORDER — NOREPINEPHRINE BITARTRATE 1 MG/ML IV SOLN
0.0000 ug/min | INTRAVENOUS | Status: DC
  Filled 2017-09-29: qty 4

## 2017-09-29 MED ORDER — VASOPRESSIN 20 UNIT/ML IV SOLN
0.0300 [IU]/min | INTRAVENOUS | Status: DC
  Administered 2017-09-29 – 2017-10-02 (×3): 0.03 [IU]/min via INTRAVENOUS
  Filled 2017-09-29 (×4): qty 2

## 2017-09-29 NOTE — ED Triage Notes (Signed)
Pt comes from [facility] via EMS  Call came out as for vomiting  Pt had been vomiting green substance  EMS arrived pt would not answer questions appropriately, staff reports pt usually alert and oriented x 4  Pt has rhonchi in all fields with gurgling noted  12 lead obtained, placed on nonrebreather  Initial oxygen saturation 47% on 2 liters/min via [place]

## 2017-09-29 NOTE — Procedures (Signed)
Arterial Catheter Insertion Procedure Note CURLEE BOGAN [id number] [birth year]  Procedure: Insertion of Arterial Catheter  Indications: Blood pressure monitoring and Frequent blood sampling  Procedure Details Consent: Unable to obtain consent because of emergent medical necessity. Time Out: Verified patient identification, verified procedure, site/side was marked, verified correct patient position, special equipment/implants available, medications/allergies/relevent history reviewed, required imaging and test results available.  Performed  Maximum sterile technique was used including antiseptics, cap, gloves, gown, hand hygiene, mask and sheet. Skin prep: Chlorhexidine; local anesthetic administered 20 gauge catheter was inserted into right radial artery using the Seldinger technique.  Evaluation Blood flow good; BP tracing good. Complications: No apparent complications.   Suzan Garibaldi 09/29/2017

## 2017-09-29 NOTE — Progress Notes (Signed)
Initial Nutrition Assessment  DOCUMENTATION CODES:   Obesity unspecified  INTERVENTION:  - If KUB negative, recommend OGT placement with initiation of 60 mL Prostat BID and Vital High Protein @ 20 mL/hr to advance by 10 mL every 12 hours to reach goal rate of Vital High Protein @ 50 mL/hr. At goal rate, this regimen will provide 1600 kcal, 165 grams of protein, and 1003 mL free water.  Monitor magnesium, potassium, and phosphorus daily for at least 3 days, MD to replete as needed, as pt is at risk for refeeding syndrome given severe hypokalemia.    NUTRITION DIAGNOSIS:   Inadequate oral intake related to inability to eat as evidenced by NPO status.  GOAL:   Provide needs based on ASPEN/SCCM guidelines  MONITOR:   Vent status, Weight trends, Labs, Skin(plan concerning nutrition support)  REASON FOR ASSESSMENT:   Ventilator, Consult Assessment of nutrition requirement/status(TF recommendations)  ASSESSMENT:   67 yo male with PMH of DM, morbid obesity, Afib, OSA, anemia, HTN, HLD, GIB, CHF, CAD, R-AKA, and recent ESBL Ecoli Bacteremia. He presented to the ED from nursing home. EMS was called for N/V and when they arrived they found patient hypoxic and minimally responsive. He was emergently intubated in the ED and found to be in septic shock requiring vasopressors.  No family/visitors present to provide PTA information. Pt currently intubated. No NGT/OGT in place. Consult for TF recommendations which are outlined above, if appropriate.  Per Dr. Mickie Kay note this AM: pt with AKI with plan for renal ultrasound, repletion ordered for hypokalemia and hypomagnesemia, pt with N/V on admission thought to possibly be d/t sepsis and plan for KUB to r/o SBO, septic shock, acute encephalopathy.  Patient is currently intubated on ventilator support MV: 12 L/min Temp (24hrs), Avg:99.1 F (37.3 C), Min:98.6 F (37 C), Max:99.3 F (37.4 C) Propofol: none BP: 105/42 and MAP:  61   Medications reviewed; 20 mg IV Pepcid BID, 50 mg Solu-Cortef QID, sliding scale Novolog, 2 g IV Mg sulfate x1 run today. Labs reviewed; CBG: 52 mg/dL this AM, Na: 133 mmol/L, K: <2 mmol/L, Mg: 1.7 mg/dL, Cl: 94 mmol/L, BUN: 41 mg/dL, creatinine: 2.03 mg/dL, Ca: 7.7 mg/dL, Alk Phos elevated, GFR: 33 mL/min.   IVF: NS @ 125 mL/hr.  Drips: Vaso @ 0.03 units/min, Fentanyl @ 75 mcg/hr, Levo @ 40 mcg/min.      NUTRITION - FOCUSED PHYSICAL EXAM:  Completed/assessed; no muscle or fat wasting, mild edema, R AKA.  Diet Order:  Diet NPO time specified  EDUCATION NEEDS:   No education needs have been identified at this time  Skin:  Skin Assessment: Skin Integrity Issues: Skin Integrity Issues:: Unstageable Unstageable: full thickness sacrum Incisions: R AKA done on 09/01/17 (area healed)  Last BM:  PTA/unknown  Height:   Ht Readings from Last 1 Encounters:  09/29/17 6' (1.829 m)    Weight:   Wt Readings from Last 1 Encounters:  09/29/17 251 lb 5.2 oz (114 kg)    Ideal Body Weight:  80.91 kg  BMI:  Body mass index is 34.09 kg/m.  Estimated Nutritional Needs:   Kcal:  1254-1596 (11-14 kcal/kg)  Protein:  >/= 162 grams (2 grams/kg IBW)  Fluid:  >/= 1.8 L/day      Jarome Matin, MS, RD, LDN, [facility] Inpatient Clinical Dietitian Pager # [phone] After hours/weekend pager # [phone]

## 2017-09-29 NOTE — Progress Notes (Signed)
Pharmacy Antibiotic Note  [name] is a 67 y.o. male admitted on 09/29/2017 with hx of ESBL bacteremia. Pharmacy has been consulted for Meropenem and Vancomycin dosing.  Plan: Meropenem 1 Gm IV q8h Vancomycin 2500 mg x1 then 1 Gm IV q24h for AUC=452 Goal AUC = 400-500 F/u scr/cultures/levels   Height: 6' (182.9 cm)(per pt) IBW/kg (Calculated) : 77.6  No data recorded.  Recent Labs  Lab 09/29/17 0441 09/29/17 0458  WBC 39.4*  --   CREATININE 2.03*  --   LATICACIDVEN  --  4.47*    CrCl cannot be calculated (Unknown ideal weight.).    No Known Allergies  Antimicrobials this admission: 3/21 meropenem >>  3/21 vancomycin >>   Dose adjustments this admission:   Microbiology results:  BCx:   UCx:    Sputum:    MRSA PCR:   Thank you for allowing pharmacy to be a part of this patient's care.  Lorenza Evangelist 09/29/2017 5:32 AM

## 2017-09-29 NOTE — Consult Note (Signed)
WOC Nurse wound consult note Reason for Consult:sacral wound, unstageable, MASD groins and perineum Wound type: pressure and MASD intertriginous and incontinence related Pressure Injury POA: Yes Measurement: 10cm x 8cm unstageable black eschar pressure injury on sacrum with bleeding and brown exudate, malodorous.  10cm x 18cm of surrounding partial thickness stage II injuries with pink wound beds, serousangenous drainage. Patient also has MASD in groins, scrotum, and perineum.  Wound bed:see above Drainage (amount, consistency, odor) see above Periwound:intact outside of the above mentioned surrounding area Dressing procedure/placement/frequency: I have provided nurses with orders for daily dressing changes with Santyl enzymatic ointment for central sacral injury. Gerhardt's Butt Cream has been ordered for groins, scrotum, perineum, TID. I have ordered Prevalon boot to left foot and Eucerin cream to left lower leg. Nutritional Consult already completed. We will not follow, but will remain available to this patient, to nursing, and the medical and/or surgical teams.  Please re-consult if we need to assist further.   Barnett Hatter, RN-C, WTA-C, [facility] Wound Treatment Associate Ostomy Care Associate

## 2017-09-29 NOTE — ED Provider Notes (Signed)
[facility] DEPT Provider Note   CSN: [id number] Arrival date & time: 09/29/17  0256     History   Chief Complaint Chief Complaint  Patient presents with  . Emesis    HPI [name] is a 67 y.o. male.  The history is provided by the EMS personnel. The history is limited by the condition of the patient (Unresponsive).  Emesis    He has history of coronary artery disease, diastolic heart failure, diabetes, hypertension, hyperlipidemia, hypoxia, morbid obesity, paroxysmal atrial fibrillation and was brought in by ambulance.  Ambiens was called to the nursing facility where he resides because of emesis.  On arrival, they noted he was poorly responsive, oxygen saturation 47%.  He was also noted to be hypotensive.  EMS was unable to start a line in route.  Past Medical History:  Diagnosis Date  . Arthritis    "hands and lower back" (09/19/2014)  . CAD (coronary artery disease)    a. s/p PCI to RCA in 2012. b. prior cath in 01/2014 with elevated L/RH pressures, mild-mod CAD of LAD/LCx with patent RCA, normal EF, c/b CIN/CHF.  [name] Carpal tunnel syndrome, bilateral   . Cellulitis   . Chronic diastolic CHF (congestive heart failure) ([place])   . Chronic kidney disease (CKD), stage III (moderate) (HCC)   . Chronic lower back pain   . Diabetes ([place])   . DKA (diabetic ketoacidoses) ([place]) 03/2017  . History of blood transfusion ~ 1954   "related to OR"  . Hyperlipidemia   . Hypertension   . Hypoxia    a. Qualified for home O2 at DC in 09/2014.  [name] Lower GI bleed   . Microcytic anemia   . Morbid obesity ([place])   . Neuropathy   . OSA (obstructive sleep apnea)    "I wear nasal prongs; haven't been using prongs recently" (09/19/2014)  . PAF (paroxysmal atrial fibrillation) ([place])    TEE DCCV 09/23/2014  . Physical deconditioning   . Pilonidal cyst 1980's; 01/25/2013  . Scrotal abscess   . Type II diabetes mellitus [facility])     Patient Active Problem List   Diagnosis Date Noted  . Acute encephalopathy   . Palliative care encounter   . UTI (urinary tract infection) 09/01/2017  . Acute metabolic encephalopathy 40/97/3532  . S/P AKA (above knee amputation) unilateral, right ([place]) 09/01/2017  . Atrial fibrillation with rapid ventricular response ([place])   . Hyponatremia 04/11/2017  . ARF (acute renal failure) ([place]) 04/11/2017  . Pressure injury of skin 03/24/2017  . Below knee amputation status, right ([place]) 03/23/2017  . DKA (diabetic ketoacidoses) ([place]) 03/14/2017  . Elevated troponin 03/14/2017  . Diabetic polyneuropathy associated with type 2 diabetes mellitus ([place])   . Osteoarthritis of both knees   . Diabetic nephropathy associated with type 2 diabetes mellitus ([place])   . Bilateral lower leg cellulitis 01/22/2017  . Venous stasis 08/06/2016  . At high risk for falls 07/21/2016  . CHF exacerbation ([place]) 06/10/2016  . Lactic acidosis 06/09/2016  . Syncope and collapse 03/05/2016  . Acute on chronic renal insufficiency 03/05/2016  . Obesity 12/30/2015  . Scrotal swelling   . Foot fracture   . 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 Lower GI bleed   . Pressure ulcer 12/24/2015  . Acute kidney injury ([place]) 12/23/2015  . Back pain 12/23/2015  . Fall   . Metatarsal fracture   . Traumatic rhabdomyolysis ([place])   . Hyperkalemia   .  Lisfranc dislocation   . Hyperglycemia   . Sepsis, unspecified organism ([place]) 09/08/2015  . Scrotal wall abscess 09/08/2015  . AKI (acute kidney injury) ([place]) 09/08/2015  . Chest wall pain   . On amiodarone therapy 12/08/2014  . Respiratory failure with hypoxia ([place]) 12/04/2014  . Hypoxia 12/04/2014  . Acute on chronic respiratory failure with hypoxia ([place])   . Atrial fibrillation with RVR ([place]) 09/20/2014  . Acute on chronic diastolic CHF (congestive heart failure) ([place]) 09/19/2014  . Acute respiratory failure ([employer]) 09/19/2014  . Abnormal nuclear cardiac imaging test 02/12/2014  .  Dyspnea 02/12/2014  . Chronic anticoagulation- Coumadin 02/12/2014  . CKD (chronic kidney disease) stage 3, GFR 30-59 ml/min (HCC) 02/12/2014  . Chronic diastolic congestive heart failure ([employer]) 02/11/2014  . Encounter for therapeutic drug monitoring 08/03/2013  . Pilonidal cyst 01/25/2013  . Perirectal cellulitis 09/06/2012  . Long term (current) use of anticoagulants 11/18/2011  . Obstructive sleep apnea- unable to tolerate c-pap 08/04/2011  . CAD S/P percutaneous coronary angioplasty 08/04/2011  . ANEMIA-IRON DEFICIENCY 01/06/2009  . OTHER AND UNSPECIFIED COAGULATION DEFECTS 01/06/2009  . ANEMIA 01/02/2009  . CARDIOVASCULAR FUNCTION STUDY, ABNORMAL 01/02/2009  . Essential hypertension, benign 10/22/2008  . Atrial fibrillation 10/22/2008  . Hyperlipidemia 09/23/2008  . HYPOKALEMIA 09/23/2008  . Obesity, Class III, BMI 40-49.9 (morbid obesity) ([place]) 09/23/2008  . CELLULITIS AND ABSCESS OF LEG EXCEPT FOOT 09/23/2008    Past Surgical History:  Procedure Laterality Date  . ABDOMINAL SURGERY  ~ 1954   BENIGN TUMOR REMOVED  . AMPUTATION Right 03/23/2017   Procedure: RIGHT BELOW KNEE AMPUTATION;  Surgeon: Newt Minion, MD;  Location: [facility];  Service: Orthopedics;  Laterality: Right;  . APPENDECTOMY    . BELOW KNEE LEG AMPUTATION Right 03/23/2017  . CARDIOVERSION  2010   Archie Endo 09/19/2014  . CARDIOVERSION N/A 09/23/2014   Procedure: CARDIOVERSION;  Surgeon: Lelon Perla, MD;  Location: [facility] ENDOSCOPY;  Service: Cardiovascular;  Laterality: N/A;  . CARDIOVERSION N/A 12/11/2014   Procedure: CARDIOVERSION;  Surgeon: Sanda Klein, MD;  Location: [facility] ENDOSCOPY;  Service: Cardiovascular;  Laterality: N/A;  . CATARACT EXTRACTION W/PHACO Right 11/15/2012   Procedure: CATARACT EXTRACTION PHACO AND INTRAOCULAR LENS PLACEMENT ([place]);  Surgeon: Adonis Brook, MD;  Location: [facility];  Service: Ophthalmology;  Laterality: Right;  . CATARACT EXTRACTION W/PHACO Left 11/29/2012   Procedure: CATARACT  EXTRACTION PHACO AND INTRAOCULAR LENS PLACEMENT (IOC);  Surgeon: Adonis Brook, MD;  Location: [facility];  Service: Ophthalmology;  Laterality: Left;  . CORONARY ANGIOPLASTY WITH STENT PLACEMENT  August 2012   RCA DES - [facility]  . DEBRIDEMENT  FOOT Left    debriding diabetic foot ulcers  . EYE SURGERY    . FOREIGN BODY REMOVAL Right 2014   heel,  splinter removal   . LEFT AND RIGHT HEART CATHETERIZATION WITH CORONARY ANGIOGRAM N/A 01/31/2014   Procedure: LEFT AND RIGHT HEART CATHETERIZATION WITH CORONARY ANGIOGRAM;  Surgeon: Blane Ohara, MD;  Location: [facility] CATH LAB;  Service: Cardiovascular;  Laterality: N/A;  . PARS PLANA VITRECTOMY Left 06/05/2013   Procedure: PARS PLANA VITRECTOMY WITH 23 GAUGE with Endolaser(constellation);  Surgeon: Adonis Brook, MD;  Location: [facility];  Service: Ophthalmology;  Laterality: Left;  . PILONIDAL CYST EXCISION N/A 01/08/2013   Procedure: CYST EXCISION PILONIDAL EXTENSIVE;  Surgeon: Ralene Ok, MD;  Location: [facility];  Service: General;  Laterality: N/A;  . PILONIDAL CYST EXCISION  1980's   "in [place]"  . TEE WITHOUT CARDIOVERSION N/A  09/23/2014   Procedure: TRANSESOPHAGEAL ECHOCARDIOGRAM (TEE);  Surgeon: Lelon Perla, MD;  Location: [facility] ENDOSCOPY;  Service: Cardiovascular;  Laterality: N/A;  . TONSILLECTOMY         Home Medications    Prior to Admission medications   Medication Sig Start Date End Date Taking? Authorizing Provider  aspirin EC 81 MG EC tablet Take 1 tablet (81 mg total) by mouth daily. 06/17/16  Yes [name], MD  atorvastatin (LIPITOR) 80 MG tablet Take 1 tablet (80 mg total) by mouth daily. 05/25/16  Yes Langeland, Dawn T, MD  calcium-vitamin D (OSCAL WITH D) 500-200 MG-UNIT tablet Take 1 tablet by mouth daily.   Yes [provider]  collagenase (SANTYL) ointment Apply 1 application topically daily.   Yes [provider]  insulin detemir (LEVEMIR) 100 UNIT/ML injection Inject 40  Units into the skin daily.    Yes [provider]  metoprolol succinate (TOPROL-XL) 50 MG 24 hr tablet Take 50 mg by mouth daily. Take with or immediately following a meal.   Yes [provider]  Multiple Vitamin (MULTIVITAMIN WITH MINERALS) TABS Take 1 tablet by mouth daily.   Yes [provider]  XARELTO 20 MG TABS tablet TAKE 1 TABLET BY MOUTH EVERY DAY WITH SUPPER 04/05/17  Yes Jegede, Olugbemiga E, MD  acetaminophen (TYLENOL) 500 MG tablet Take 500 mg by mouth every 6 (six) hours as needed for mild pain.     [provider]  Amino Acids-Protein Hydrolys (FEEDING SUPPLEMENT, PRO-STAT SUGAR FREE 64,) LIQD Take 30 mLs by mouth 3 (three) times daily with meals. Patient taking differently: Take 30 mLs by mouth 2 (two) times daily.  04/21/17   Sheikh, Georgina Quint Latif, DO  calcium carbonate (OS-CAL - DOSED IN MG OF ELEMENTAL CALCIUM) 1250 (500 Ca) MG tablet Take 1 tablet (500 mg of elemental calcium total) by mouth 3 (three) times daily with meals. 09/13/17   Georgette Shell, MD  diltiazem (CARDIZEM) 120 MG tablet Take 1 tablet (120 mg total) by mouth every 6 (six) hours. 09/13/17   Georgette Shell, MD  feeding supplement, GLUCERNA SHAKE, (GLUCERNA SHAKE) LIQD Take 237 mLs by mouth 3 (three) times daily between meals. Patient not taking: Reported on 09/01/2017 04/21/17   [name] Latif, DO  ferrous sulfate 325 (65 FE) MG tablet Take 1 tablet (325 mg total) by mouth 2 (two) times daily with a meal. 09/13/17   Georgette Shell, MD  gabapentin (NEURONTIN) 300 MG capsule Take 2 capsules (600 mg total) by mouth 2 (two) times daily. 05/25/16   Langeland, Leda Quail, MD  hydrocerin (EUCERIN) CREA Apply 1 application topically 3 (three) times daily. legs Patient not taking: Reported on 09/01/2017 05/25/16   Lottie Mussel T, MD  lidocaine (LMX) 4 % cream Apply topically daily as needed (during hydro). Patient not taking: Reported on 09/01/2017 04/21/17   [name]  Latif, DO  loperamide (IMODIUM) 2 MG capsule Take 1 capsule (2 mg total) by mouth every 6 (six) hours as needed for diarrhea or loose stools. 09/13/17   Georgette Shell, MD  meropenem (MERREM) IVPB Inject 1 g into the vein every 8 (eight) hours. Indication:  ESBL C Coli Bacteremia Last Day of Therapy:  09/16/2017 Labs - Once weekly:  CBC/D and BMP, Labs - Every other week:  ESR and CRP 09/13/17   Georgette Shell, MD  ondansetron (ZOFRAN) 4 MG tablet Take 4 mg by mouth every 8 (eight) hours as needed for nausea  or vomiting.    [provider]  oxyCODONE (ROXICODONE) 15 MG immediate release tablet Take 1 tablet (15 mg total) by mouth every 4 (four) hours as needed for pain. Hold for Sedation Patient not taking: Reported on 09/01/2017 03/25/17   Newt Minion, MD  OXYGEN Inhale 2-3 L into the lungs continuous.    [provider]  potassium & sodium phosphates (PHOS-NAK) 280-160-250 MG PACK Take 1 packet by mouth 4 (four) times daily -  with meals and at bedtime. 09/13/17   Georgette Shell, MD  promethazine (PHENERGAN) 25 MG/ML injection Inject 12.5 mg into the vein once.    [provider]    Family History Family History  Adopted: Yes  Problem Relation Age of Onset  . Other Other        PT ADOPTED    Social History Social History   Tobacco Use  . Smoking status: Former Smoker    Packs/day: 1.00    Years: 20.00    Pack years: 20.00    Types: Cigarettes    Last attempt to quit: 07/13/1983    Years since quitting: 34.2  . Smokeless tobacco: Never Used  Substance Use Topics  . Alcohol use: No  . Drug use: No     Allergies   Patient has no known allergies.   Review of Systems Review of Systems  Unable to perform ROS: Patient unresponsive  Gastrointestinal: Positive for vomiting.     Physical Exam Updated Vital Signs SpO2 90%   Physical Exam  Nursing note and vitals reviewed.  67 year old male, responsive to painful stimuli but not  verbal stimuli.  Vital signs are significant for hypotension. Oxygen saturation is 83% on 100% oxygen, which is hypoxic. Head is normocephalic and atraumatic. PERRLA, EOMI. Oropharynx is clear. Neck is nontender and supple without adenopathy or JVD. Back is nontender and there is no CVA tenderness. Lungs have distant breath sounds bilaterally, but air movement is symmetric.  No rales, wheezes, rhonchi. Chest is nontender. Heart has regular rate and rhythm without murmur. Abdomen is soft, obese, nontender without masses or hepatosplenomegaly and peristalsis is normoactive. Extremities: Right above-the-knee amputation.  Chronic venous stasis changes on the left leg.  No edema. Skin is warm and dry without rash. Neurologic: He is responsive to painful stimuli but not verbal stimuli, cranial nerves are intact, there are no motor or sensory deficits.  ED Treatments / Results  Labs (all labs ordered are listed, but only abnormal results are displayed) Labs Reviewed  COMPREHENSIVE METABOLIC PANEL - Abnormal; Notable for the following components:      Result Value   Sodium 133 (*)    Potassium <2.0 (*)    Chloride 94 (*)    Glucose, Bld 63 (*)    BUN 41 (*)    Creatinine, Ser 2.03 (*)    Calcium 7.4 (*)    Total Protein 6.4 (*)    Albumin 1.8 (*)    ALT 16 (*)    Alkaline Phosphatase 339 (*)    GFR calc non Af Amer 33 (*)    GFR calc Af Amer 38 (*)    Anion gap 17 (*)    All other components within normal limits  CBC WITH DIFFERENTIAL/PLATELET - Abnormal; Notable for the following components:   WBC 39.4 (*)    Hemoglobin 9.5 (*)    HCT 30.9 (*)    MCV 72.4 (*)    MCH 22.2 (*)    RDW 21.6 (*)  Platelets 737 (*)    Neutro Abs 36.6 (*)    Monocytes Absolute 1.2 (*)    All other components within normal limits  URINALYSIS, ROUTINE W REFLEX MICROSCOPIC - Abnormal; Notable for the following components:   Color, Urine AMBER (*)    APPearance CLOUDY (*)    Hgb urine dipstick MODERATE  (*)    Protein, ur 100 (*)    Leukocytes, UA MODERATE (*)    Squamous Epithelial / LPF 0-5 (*)    All other components within normal limits  BLOOD GAS, ARTERIAL - Abnormal; Notable for the following components:   pO2, Arterial 57.7 (*)    Acid-base deficit 3.3 (*)    All other components within normal limits  LACTIC ACID, PLASMA - Abnormal; Notable for the following components:   Lactic Acid, Venous 3.7 (*)    All other components within normal limits  TROPONIN I - Abnormal; Notable for the following components:   Troponin I 0.04 (*)    All other components within normal limits  PROTIME-INR - Abnormal; Notable for the following components:   Prothrombin Time 31.3 (*)    All other components within normal limits  I-STAT CG4 LACTIC ACID, ED - Abnormal; Notable for the following components:   Lactic Acid, Venous 4.47 (*)    All other components within normal limits  CBG MONITORING, ED - Abnormal; Notable for the following components:   Glucose-Capillary 52 (*)    All other components within normal limits  CULTURE, BLOOD (ROUTINE X 2)  CULTURE, BLOOD (ROUTINE X 2)  URINE CULTURE  CULTURE, RESPIRATORY (NON-EXPECTORATED)  MRSA PCR SCREENING  MAGNESIUM  PHOSPHORUS  PROCALCITONIN  LACTIC ACID, PLASMA  LACTIC ACID, PLASMA  CORTISOL  I-STAT CG4 LACTIC ACID, ED    EKG  EKG Interpretation  Date/Time:  Thursday September 29 2017 04:31:33 EDT Ventricular Rate:  84 PR Interval:    QRS Duration: 123 QT Interval:  407 QTC Calculation: 482 R Axis:   -39 Text Interpretation:  Atrial fibrillation Ventricular premature complex Nonspecific IVCD with LAD Probable lateral infarct, age indeterminate When compared with ECG of 09/01/2017, No significant change was found Confirmed by Delora Fuel ([id number]) on 09/29/2017 4:39:07 AM       Radiology Dg Chest Port 1 View  Result Date: 09/29/2017 CLINICAL DATA:  Intubation. EXAM: PORTABLE CHEST 1 VIEW COMPARISON:  Radiograph 09/11/2017 FINDINGS:  Endotracheal tube is 3.4 cm from the carina. Low lung volumes. The heart is enlarged, similar to prior exam. New bilateral perihilar opacities. No large pleural effusion or pneumothorax. IMPRESSION: 1. Endotracheal tube 3.4 cm from the carina. 2. Cardiomegaly. Bilateral perihilar opacities may be pulmonary edema, aspiration or pneumonia. Electronically Signed   By: Jeb Levering M.D.   On: 09/29/2017 04:41    Procedures .Central Line Date/Time: 09/29/2017 3:10 AM Performed by: Delora Fuel, MD Authorized by: Delora Fuel, MD   Consent:    Consent obtained:  Emergent situation   Consent given by: Implied consent. Pre-procedure details:    Hand hygiene: Hand hygiene performed prior to insertion     Sterile barrier technique: All elements of maximal sterile technique followed     Skin preparation:  ChloraPrep   Skin preparation agent: Skin preparation agent completely dried prior to procedure   Anesthesia (see MAR for exact dosages):    Anesthesia method:  None Procedure details:    Location:  R femoral   Site selection rationale:  Ease of procedure, no risk of pneumothorax   Patient position:  Flat   Procedural supplies:  Triple lumen   Catheter size:  7.5 Fr   Number of attempts:  1   Successful placement: yes   Post-procedure details:    Post-procedure:  Dressing applied and line sutured   Assessment:  Blood return through all ports and free fluid flow   Patient tolerance of procedure:  Tolerated well, no immediate complications Procedure Name: Intubation Date/Time: 09/29/2017 7:13 AM Performed by: Delora Fuel, MD Pre-anesthesia Checklist: Patient identified, Patient being monitored, Emergency Drugs available, Timeout performed and Suction available Oxygen Delivery Method: Non-rebreather mask Preoxygenation: Pre-oxygenation with 100% oxygen Induction Type: Rapid sequence Ventilation: Mask ventilation without difficulty Laryngoscope Size: Glidescope and 4 Grade View: Grade  I Tube size: 7.5 mm Airway Equipment and Method: Video-laryngoscopy and Rigid stylet Placement Confirmation: ETT inserted through vocal cords under direct vision,  Breath sounds checked- equal and bilateral and Positive ETCO2 Secured at: 24 cm Tube secured with: ETT holder Dental Injury: Teeth and Oropharynx as per pre-operative assessment        CRITICAL CARE Performed by: Delora Fuel Total critical care time: 200 minutes Critical care time was exclusive of separately billable procedures and treating other patients. Critical care was necessary to treat or prevent imminent or life-threatening deterioration. Critical care was time spent personally by me on the following activities: development of treatment plan with patient and/or surrogate as well as nursing, discussions with consultants, evaluation of patient's response to treatment, examination of patient, obtaining history from patient or surrogate, ordering and performing treatments and interventions, ordering and review of laboratory studies, ordering and review of radiographic studies, pulse oximetry and re-evaluation of patient's condition.  Medications Ordered in ED Medications  vancomycin (VANCOCIN) 2,500 mg in sodium chloride 0.9 % 500 mL IVPB (2,500 mg Intravenous New Bag/Given 09/29/17 0520)  sodium chloride flush (NS) 0.9 % injection 3 mL (has no administration in time range)  sodium chloride flush (NS) 0.9 % injection 3 mL (has no administration in time range)  0.9 %  sodium chloride infusion (has no administration in time range)  acetaminophen (TYLENOL) tablet 650 mg (has no administration in time range)  ondansetron (ZOFRAN) injection 4 mg (has no administration in time range)  famotidine (PEPCID) IVPB 20 mg premix (has no administration in time range)  0.9 %  sodium chloride infusion (has no administration in time range)  enoxaparin (LOVENOX) injection 40 mg (has no administration in time range)  fentaNYL (SUBLIMAZE)  2,500 mcg in sodium chloride 0.9 % 250 mL (10 mcg/mL) infusion (has no administration in time range)  fentaNYL (SUBLIMAZE) bolus via infusion 50 mcg (has no administration in time range)  norepinephrine (LEVOPHED) 4 mg in dextrose 5 % 250 mL (0.016 mg/mL) infusion (has no administration in time range)  hydrocortisone sodium succinate (SOLU-CORTEF) 100 MG injection 50 mg (50 mg Intravenous Given 09/29/17 0600)  albuterol (PROVENTIL) (2.5 MG/3ML) 0.083% nebulizer solution 2.5 mg (2.5 mg Nebulization Given 09/29/17 0529)  budesonide (PULMICORT) nebulizer solution 0.5 mg (has no administration in time range)  midazolam (VERSED) injection 1 mg (has no administration in time range)  insulin aspart (novoLOG) injection 2-6 Units (2 Units Subcutaneous Not Given 09/29/17 0515)  vasopressin (PITRESSIN) 40 Units in sodium chloride 0.9 % 250 mL (0.16 Units/mL) infusion (0.03 Units/min Intravenous New Bag/Given 09/29/17 0534)  meropenem (MERREM) 1 g in sodium chloride 0.9 % 100 mL IVPB (0 g Intravenous Stopped 09/29/17 0611)  potassium chloride 10 mEq in 100 mL IVPB (10 mEq Intravenous New Bag/Given 09/29/17 0617)  magnesium sulfate IVPB 2 g 50 mL (2 g Intravenous New Bag/Given 09/29/17 0618)  vancomycin (VANCOCIN) IVPB 1000 mg/200 mL premix (has no administration in time range)  sodium chloride 0.9 % bolus 1,000 mL (0 mLs Intravenous Stopped 09/29/17 0525)    And  sodium chloride 0.9 % bolus 1,000 mL (0 mLs Intravenous Stopped 09/29/17 0555)    And  sodium chloride 0.9 % bolus 1,000 mL (0 mLs Intravenous Stopped 09/29/17 0500)    And  sodium chloride 0.9 % bolus 500 mL (0 mLs Intravenous Stopped 09/29/17 0556)  midazolam (VERSED) 2 MG/2ML injection (5 mg  Given 09/29/17 0355)  fentaNYL (SUBLIMAZE) 2,500 mcg in sodium chloride 0.9 % 250 mL (10 mcg/mL) infusion (125 mcg/hr Intravenous Rate/Dose Change 09/29/17 0701)  midazolam (VERSED) 50 mg in sodium chloride 0.9 % 50 mL (1 mg/mL) infusion (0.5 mg/hr Intravenous  Rate/Dose Change 09/29/17 0657)  sodium chloride 0.9 % bolus 1,000 mL (0 mLs Intravenous Stopped 09/29/17 0632)  norepinephrine (LEVOPHED) 4 mg in dextrose 5 % 250 mL (0.016 mg/mL) infusion (35 mcg/min Intravenous Rate/Dose Change 09/29/17 0702)  fentaNYL (SUBLIMAZE) injection 50 mcg (50 mcg Intravenous Given 09/29/17 0355)  dextrose 50 % solution (50 mLs  Given 09/29/17 0705)     Initial Impression / Assessment and Plan / ED Course  I have reviewed the triage vital signs and the nursing notes.  Pertinent labs & imaging results that were available during my care of the patient were reviewed by me and considered in my medical decision making (see chart for details).  Patient arrived in the ED hypotensive, hypoxic with altered mental status.  No IV site readily available, so left intraosseous line was inserted and fluid resuscitation begun there.  Following this, right femoral triple-lumen line was inserted to continue fluid resuscitation.  Once good IV access was obtained, attention was turned to his need for intubation.  He needed to be intubated because of hypoxia in spite of 100% oxygen, and altered mentation.  He was intubated with rapid sequence induction with etomidate and succinylcholine.  Following this, he is maintaining adequate oxygen saturations.  Portable chest x-ray has been ordered.  He is started on sepsis protocol.  Old records are reviewed, and he was recently admitted to the hospital for urosepsis.  It is noted that he was supposed to be DNR and on palliative care, but did not arrived in ED with any paperwork attesting to this.  Intubation was done prior to my reviewing his old record.  Following completion of early, goal directed fluid therapy, patient was still hypotensive and was started on norepinephrine.  Chest x-ray shows appropriate placement of endotracheal tube, and development of some interstitial lung opacities consistent with pneumonia.  Urinalysis does not show evidence of  infection.  He was started on antibiotics for healthcare associated pneumonia.  Case is discussed with Dr. Mortimer Fries of critical care service who agrees to admit the patient.  While waiting for the intensivist to come and evaluate the patient in person, I continue to manage his condition.  He was noted to have severe hypokalemia and intravenous potassium has been ordered.  Magnesium was checked and has come back slightly low as well.  Intravenous magnesium has been ordered.  Lactic acid is elevated consistent with sepsis.  Creatinine has increased over baseline.  Chronic anemia is present and unchanged from baseline.  Blood glucose did drop to 51, and he was given dextrose intravenously.  At this time, blood pressure has improved, but is still  only in the 82X systolic.  Patient has extremely poor prognosis at this point.  Final Clinical Impressions(s) / ED Diagnoses   Final diagnoses:  Sepsis, due to unspecified organism ([place])  HCAP (healthcare-associated pneumonia)  Acute kidney injury (nontraumatic) ([place])  Hypokalemia  Hypomagnesemia  Microcytic anemia  Hypoglycemia    ED Discharge Orders    None       Delora Fuel, MD 93/71/69 [phone]

## 2017-09-29 NOTE — Progress Notes (Signed)
PULMONARY / CRITICAL CARE MEDICINE   Name: [name] MRN: [id number] DOB: 04/09/1951    ADMISSION DATE:  09/29/2017  REFERRING MD:  Dr. Preston Fleeting, ER  CHIEF COMPLAINT:  Vomiting  HISTORY OF PRESENT ILLNESS:   67 yo male former smoker from [facility] with vomiting, altered mental status, hypoxia.  Intubated in ER and started on pressors for septic shock.  Found to have severe hypokalemia.  PMHx of CAD, diastolic CHF, CKD 3, DM, HLD, HTN, OSA, Neuropathy, GI bleeding, A fib, PAD s/p Rt AKA, ESBL E coli bacteremia.  SUBJECTIVE:  Remains on pressors, vent.  VITAL SIGNS: BP (!) 138/32   Pulse 65   Temp 99.7 F (37.6 C)   Resp (!) 21   Ht 6' (1.829 m) Comment: per pt  Wt 251 lb 5.2 oz (114 kg)   SpO2 99%   BMI 34.09 kg/m   VENTILATOR SETTINGS: Vent Mode: PRVC FiO2 (%):  [50 %-100 %] 50 % Set Rate:  [18 bmp] 18 bmp Vt Set:  [620 mL] 620 mL PEEP:  [5 cmH20-10 cmH20] 5 cmH20 Plateau Pressure:  [18 cmH20-29 cmH20] 18 cmH20  INTAKE / OUTPUT: I/O last 3 completed shifts: In: 4600 [IV Piggyback:4600] Out: -   PHYSICAL EXAMINATION:  General - somnelent Eyes - pupils reactive ENT - ETT in place Cardiac - regular, no murmur Chest - b/l rhonchi Abd - soft, non tender Ext - Rt AKA Skin - unstageable sacral wound Neuro - opens eyes with stimulation, not following commands   LABS:  BMET Recent Labs  Lab 09/29/17 0441 09/29/17 1123  NA 133* 132*  K <2.0* 2.2*  CL 94* 102  CO2 22 18*  BUN 41* 39*  CREATININE 2.03* 1.70*  GLUCOSE 63* 218*    Electrolytes Recent Labs  Lab 09/29/17 0441 09/29/17 0553 09/29/17 1123  CALCIUM 7.4*  --  6.5*  MG  --  1.7  --   PHOS  --  2.7  --     CBC Recent Labs  Lab 09/29/17 0441  WBC 39.4*  HGB 9.5*  HCT 30.9*  PLT 737*    Coag's Recent Labs  Lab 09/29/17 0553  INR 3.04    Sepsis Markers Recent Labs  Lab 09/29/17 0458 09/29/17 0553 09/29/17 0848  LATICACIDVEN 4.47* 3.7* 3.1*  PROCALCITON  --   5.95  --     ABG Recent Labs  Lab 09/29/17 0531  PHART 7.381  PCO2ART 35.9  PO2ART 57.7*    Liver Enzymes Recent Labs  Lab 09/29/17 0441  AST 29  ALT 16*  ALKPHOS 339*  BILITOT 0.5  ALBUMIN 1.8*    Cardiac Enzymes Recent Labs  Lab 09/29/17 0553  TROPONINI 0.04*    Glucose Recent Labs  Lab 09/29/17 0703 09/29/17 1131  GLUCAP 52* 194*    Imaging US Renal  Result Date: 09/29/2017 CLINICAL DATA:  Acute kidney injury superimposed on chronic renal insufficiency. History of diabetes and hypertension. EXAM: RENAL / URINARY TRACT ULTRASOUND COMPLETE COMPARISON:  Renal ultrasound of June 10, 2016 FINDINGS: Right Kidney: Length: 12.4 cm. The renal cortical echotexture remains lower than that of the adjacent liver. There is no hydronephrosis. Left Kidney: Length: 13.4 cm. The renal cortical echotexture is similar to that on the right. There is no hydronephrosis. Bladder: The urinary bladder is decompressed by Foley catheter. IMPRESSION: Normal appearance of both kidneys.  No hydronephrosis. Electronically Signed   By: David  [place] M.D.   On: 09/29/2017 08:22   Dg Chest  Port 1 View  Result Date: 09/29/2017 CLINICAL DATA:  Intubation. EXAM: PORTABLE CHEST 1 VIEW COMPARISON:  Radiograph 09/11/2017 FINDINGS: Endotracheal tube is 3.4 cm from the carina. Low lung volumes. The heart is enlarged, similar to prior exam. New bilateral perihilar opacities. No large pleural effusion or pneumothorax. IMPRESSION: 1. Endotracheal tube 3.4 cm from the carina. 2. Cardiomegaly. Bilateral perihilar opacities may be pulmonary edema, aspiration or pneumonia. Electronically Signed   By: Rubye Oaks M.D.   On: 09/29/2017 04:41     STUDIES:  Renal u/s 3/21 >> normal kidneys Echo 3/21 >>   CULTURES: Blood 3/21 >> Urine 3/21 >> Sputum 3/21 >>   ANTIBIOTICS: Vancomycin 3/21 >> Meropenem 3/21 >>   SIGNIFICANT EVENTS: 3/21 Admit  LINES/TUBES: ETT 3/21 >>   DISCUSSION: Remains on  pressors, full vent support.  Marginal improvement in mental status.  Electrolytes still low.  ASSESSMENT / PLAN:  Septic shock. Aspiration pneumonia. Unstageable sacral wound >> present prior to admission. - pressors to keep MAP > 65 - continue IV fluids - continue ABx - wound care - continue solu cortef while on pressors  Acute respiratory failure. Hx of OSA. - full vent support - f/u CXR - prn BDs  Bradycardia from hypokalemia. Hx of CAD, chronic diastolic CHF, A fib, HTN. - monitor on telemetry - hold outpt lipitor, ASA, cardizem, toprol, xarelto  Acute renal failure >> baseline creatinine 0.96. Hypokalemia, hypomagnesemia. Lactic acidosis. - f/u BMET, lactic acid - replace electrolytes as needed  Anemia of critical illness and chronic disease. - f/u CBC  Nausea and vomiting. - OG tube to suction - prn zofran  Acute metabolic encephalopathy. - monitor mental status - hold outpt neurontin, oxycodone  DM type II. - SSI  DVT prophylaxis - SCDs SUP - pepcid Nutrition - NPO Goals of care - full code; reported to have out of hospital DNR form >> will need to clarify with his family.  CC time 37 minutes  Coralyn Helling, MD [facility] Pulmonary/Critical Care 09/29/2017, 1:28 PM Pager:  [phone] After 3pm call: [phone]

## 2017-09-29 NOTE — Progress Notes (Signed)
Pharmacy Antibiotic Note  [name] is a 67 y.o. male admitted on 09/29/2017 with septic shock, VDRF.  Pharmacy has been consulted for meropenem and vancomycin dosing. aspiration PNA- B infiltrates c/w witnessed aspiration event, UTI, recent ESBL E coli bacteremia;  On Merrem 1 gm q8 for ESBL E coli bacteremia - completed course 09/16/17 SCr 2.03>1.7. AF, elevated WBC, on solucortef.   Plan: consider stopping vancomycin with negative MRSA PCR Vancomycin 2500 mg given at 0520 am then Vancomycin 1250 mg IV q24 for est AUC 475 (SCr 1.7) Meropenem 1 gm IV q8 F/u renal function, temp, WBC, culture data Vancomycin levels as needed.  Height: 6' (182.9 cm)(per pt) Weight: 251 lb 5.2 oz (114 kg) IBW/kg (Calculated) : 77.6  Temp (24hrs), Avg:99.3 F (37.4 C), Min:98.6 F (37 C), Max:99.9 F (37.7 C)  Recent Labs  Lab 09/29/17 0441 09/29/17 0458 09/29/17 0553 09/29/17 0848 09/29/17 1123  WBC 39.4*  --   --   --   --   CREATININE 2.03*  --   --   --  1.70*  LATICACIDVEN  --  4.47* 3.7* 3.1*  --     Estimated Creatinine Clearance: 56.5 mL/min (A) (by C-G formula based on SCr of 1.7 mg/dL (H)).    No Known Allergies Antimicrobials this admission: 3/21 meropenem >>  3/21 vancomycin >>   Dose adjustments this admission: 3/21 vanc 1 Gm IV q24h for AUC = 452 (using SCr 2.03)  3/21 vanc 1250 q24 for AUC 475 (SCr 1.7)   Microbiology results: 3/21 Ucx>> 3/21 BCx>> 3/21 MRSA PCR neg 3/21 sputum ordered>  Thank you for allowing pharmacy to be a part of this patient's care.  Herby Abraham, Pharm.D. [phone] 09/29/2017 1:40 PM

## 2017-09-29 NOTE — Progress Notes (Signed)
K 2.4. Ordered KCL IV and KCL PO. Mg 1.9. Ordered 1g Magnesium sulfate IV. Will repeat BMP at 12am.

## 2017-09-29 NOTE — H&P (Signed)
PULMONARY / CRITICAL CARE MEDICINE   Name: [name] MRN: [id number] DOB: 28-Jun-1951    ADMISSION DATE:  09/29/2017 CONSULTATION DATE: 09/29/17  REFERRING MD: ER MD  CHIEF COMPLAINT:  Septic shock, VDRF  HISTORY OF PRESENT ILLNESS:   60yoM with DM, Morbid obesity, Afib, OSA, Anemia, HTN, HLD, hx GIB, Diastolic CHF, CAD, R-AKA, and recent ESBL Ecoli Bacteremia, who presented to the ER tonight from his nursing home. EMS was called for N/V and when they arrived they found patient hypoxic (Pox 47% on 2L O2) and minimally responsive. Brought to ER where he was emergently intubated and found to be in septic shock requiring vasopressors. He was pancultured and started on broad spectrum antibiotics. At time of my exam there is now family in the ER, and patient is intubated/sedated.   PAST MEDICAL HISTORY :  He  has a past medical history of Arthritis, CAD (coronary artery disease), Carpal tunnel syndrome, bilateral, Cellulitis, Chronic diastolic CHF (congestive heart failure) ([place]), Chronic kidney disease (CKD), stage III (moderate) ([employer]), Chronic lower back pain, Diabetes ([place]), DKA (diabetic ketoacidoses) ([place]) (03/2017), History of blood transfusion (~ 1954), Hyperlipidemia, Hypertension, Hypoxia, Lower GI bleed, Microcytic anemia, Morbid obesity ([place]), Neuropathy, OSA (obstructive sleep apnea), PAF (paroxysmal atrial fibrillation) ([place]), Physical deconditioning, Pilonidal cyst (1980's; 01/25/2013), Scrotal abscess, and Type II diabetes mellitus ([place]).  PAST SURGICAL HISTORY: He  has a past surgical history that includes Abdominal surgery (~ 1954); Debridement  foot (Left); Cataract extraction w/PHACO (Right, 11/15/2012); Cataract extraction w/PHACO (Left, 11/29/2012); Pilonidal cyst excision (N/A, 01/08/2013); Foreign Body Removal (Right, 2014); Pars plana vitrectomy (Left, 06/05/2013); left and right heart catheterization with coronary angiogram (N/A, 01/31/2014); Tonsillectomy; Appendectomy; Eye  surgery; Cardioversion (2010); Pilonidal cyst excision (1980's); TEE without cardioversion (N/A, 09/23/2014); Cardioversion (N/A, 09/23/2014); Cardioversion (N/A, 12/11/2014); Coronary angioplasty with stent (August 2012); Below knee leg amputation (Right, 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  No Known Allergies  No current facility-administered medications on file prior to encounter.    Current Outpatient Medications on File Prior to Encounter  Medication Sig  . acetaminophen (TYLENOL) 500 MG tablet Take 500 mg by mouth every 6 (six) hours as needed for mild pain.   [name] aluminum-magnesium hydroxide-simethicone (MAALOX) 366-440-34 MG/5ML SUSP Take 30 mLs by mouth every 4 (four) hours as needed (heartburn).  . Amino Acids-Protein Hydrolys (FEEDING SUPPLEMENT, PRO-STAT SUGAR FREE 64,) LIQD Take 30 mLs by mouth 3 (three) times daily with meals.  [name] aspirin EC 81 MG EC tablet Take 1 tablet (81 mg total) by mouth daily.  [name] atorvastatin (LIPITOR) 80 MG tablet Take 1 tablet (80 mg total) by mouth daily.  . calcium-vitamin D (OSCAL WITH D) 500-200 MG-UNIT tablet Take 1 tablet by mouth daily.  . collagenase (SANTYL) ointment Apply 1 application topically daily.  [name] diltiazem (CARDIZEM) 120 MG tablet Take 1 tablet (120 mg total) by mouth every 6 (six) hours.  . feeding supplement, GLUCERNA SHAKE, (GLUCERNA SHAKE) LIQD Take 237 mLs by mouth 3 (three) times daily between meals.  . ferrous sulfate 325 (65 FE) MG tablet Take 1 tablet (325 mg total) by mouth 2 (two) times daily with a meal.  . gabapentin (NEURONTIN) 300 MG capsule Take 2 capsules (600 mg total) by mouth 2 (two) times daily.  . hydrocerin (EUCERIN) CREA Apply 1 application topically 3 (three) times daily. legs  . insulin aspart (NOVOLOG FLEXPEN) 100 UNIT/ML FlexPen Inject 2-10 Units into the skin 4 (four) times daily -  with meals and at bedtime. Per sliding  scale.. 150-200=2 units 201-250=4 units 251-300=6 units 301-350=8  units 351-400=10 units Over 400 call MD  . insulin detemir (LEVEMIR) 100 UNIT/ML injection Inject 40 Units into the skin daily.   [name] lidocaine (LMX) 4 % cream Apply topically daily as needed (during hydro).  [name] loperamide (IMODIUM) 2 MG capsule Take 1 capsule (2 mg total) by mouth every 6 (six) hours as needed for diarrhea or loose stools.  . metoprolol succinate (TOPROL-XL) 50 MG 24 hr tablet Take 50 mg by mouth daily. Take with or immediately following a meal.  . Multiple Vitamin (MULTIVITAMIN WITH MINERALS) TABS Take 1 tablet by mouth daily.  . ondansetron (ZOFRAN) 4 MG tablet Take 4 mg by mouth every 8 (eight) hours as needed for nausea or vomiting.  . Oxycodone HCl 20 MG TABS Take 20 mg by mouth every 4 (four) hours as needed (pain).  . potassium & sodium phosphates (PHOS-NAK) 280-160-250 MG PACK Take 1 packet by mouth 4 (four) times daily -  with meals and at bedtime.  . potassium chloride SA (K-DUR,KLOR-CON) 20 MEQ tablet Take 40 mEq by mouth every 6 (six) hours. For 2 days  . XARELTO 20 MG TABS tablet TAKE 1 TABLET BY MOUTH EVERY DAY WITH SUPPER  . calcium carbonate (OS-CAL - DOSED IN MG OF ELEMENTAL CALCIUM) 1250 (500 Ca) MG tablet Take 1 tablet (500 mg of elemental calcium total) by mouth 3 (three) times daily with meals. (Patient not taking: Reported on 09/29/2017)  . meropenem (MERREM) IVPB Inject 1 g into the vein every 8 (eight) hours. Indication:  ESBL C Coli Bacteremia Last Day of Therapy:  09/16/2017 Labs - Once weekly:  CBC/D and BMP, Labs - Every other week:  ESR and CRP (Patient not taking: Reported on 09/29/2017)  . oxyCODONE (ROXICODONE) 15 MG immediate release tablet Take 1 tablet (15 mg total) by mouth every 4 (four) hours as needed for pain. Hold for Sedation (Patient not taking: Reported on 09/01/2017)  . OXYGEN Inhale 2-3 L into the lungs continuous.   FAMILY HISTORY:  His is adopted.   SOCIAL HISTORY: He  reports that he quit smoking about 34 years ago. His smoking use  included cigarettes. He has a 20.00 pack-year smoking history. He has never used smokeless tobacco. He reports that he does not drink alcohol or use drugs.  REVIEW OF SYSTEMS:   Review of Systems  Unable to perform ROS: Critical illness   SUBJECTIVE:  Intubated and Sedated   VITAL SIGNS: BP (!) 86/41   Pulse (!) 52   Temp 99.3 F (37.4 C)   Resp 18   Ht 6' (1.829 m) Comment: per pt  Wt 114 kg (251 lb 5.2 oz)   SpO2 98%   BMI 34.09 kg/m   HEMODYNAMICS:  Levophed @ 47mg; Vasopression 0.03  VENTILATOR SETTINGS: Vent Mode: PRVC FiO2 (%):  [50 %-100 %] 60 % Set Rate:  [18 bmp] 18 bmp Vt Set:  [[474mL] 620 mL PEEP:  [5 cmH20-10 cmH20] 5 cmH20 Plateau Pressure:  [29 cmH20] 29 cmH20  INTAKE / OUTPUT: I/O last 3 completed shifts: In: 480[IV Piggyback:4600] Out: -   PHYSICAL EXAMINATION: General: Morbidly obese Elderly male, Chronically ill appearing, now is Critically ill, intubated and sedated Neuro: PERRL, deeply sedated, no response to sternal rub; no spontaneous movement of any extremities  HEENT: OP clear, MM moist  Cardiovascular: RRR no m/r/g Lungs: Rhonchi b/l; no accessory muscle use Abdomen: Obese, Soft, NTND, BS hypoactive Musculoskeletal: R-AKA; Left foot  no edema Skin: no rashes; small scab in mid-abdomen  LABS:  BMET Recent Labs  Lab 09/29/17 0441  NA 133*  K <2.0*  CL 94*  CO2 22  BUN 41*  CREATININE 2.03*  GLUCOSE 63*   Electrolytes Recent Labs  Lab 09/29/17 0441 09/29/17 0553  CALCIUM 7.4*  --   MG  --  1.7  PHOS  --  2.7   CBC Recent Labs  Lab 09/29/17 0441  WBC 39.4*  HGB 9.5*  HCT 30.9*  PLT 737*   Coag's Recent Labs  Lab 09/29/17 0553  INR 3.04   Sepsis Markers Recent Labs  Lab 09/29/17 0458 09/29/17 0553  LATICACIDVEN 4.47* 3.7*  PROCALCITON  --  5.95   ABG Recent Labs  Lab 09/29/17 0531  PHART 7.381  PCO2ART 35.9  PO2ART 57.7*   Liver Enzymes Recent Labs  Lab 09/29/17 0441  AST 29  ALT 16*   ALKPHOS 339*  BILITOT 0.5  ALBUMIN 1.8*   Cardiac Enzymes Recent Labs  Lab 09/29/17 0553  TROPONINI 0.04*   Glucose Recent Labs  Lab 09/29/17 0703  GLUCAP 52*   Imaging Dg Chest Port 1 View  Result Date: 09/29/2017 CLINICAL DATA:  Intubation. EXAM: PORTABLE CHEST 1 VIEW COMPARISON:  Radiograph 09/11/2017 FINDINGS: Endotracheal tube is 3.4 cm from the carina. Low lung volumes. The heart is enlarged, similar to prior exam. New bilateral perihilar opacities. No large pleural effusion or pneumothorax. IMPRESSION: 1. Endotracheal tube 3.4 cm from the carina. 2. Cardiomegaly. Bilateral perihilar opacities may be pulmonary edema, aspiration or pneumonia. Electronically Signed   By: Jeb Levering M.D.   On: 09/29/2017 04:41   STUDIES:  Renal US: ordered   CULTURES: Blood cultures (3/21): pending Urine cultures (3/21): pending Sputum culture: ordered   ANTIBIOTICS: Vancomycin 3/21 >> Meropenem 3/21 >>  SIGNIFICANT EVENTS: 3/21: EMS called to nursing home for patient with N/V >> arrived to find patient unresponsive, gurgling on secretions, severe hypoxia >> brought to ER and intubated, septic shock.   LINES/TUBES: Right femoral TLC 3/21 >> ETT 3/21 >> OG tube 3/21 >> Foley catheter 3/21 >> PIV  DISCUSSION: 66yoM with DM, Morbid obesity, Afib, OSA, Anemia, HTN, HLD, hx GIB, Diastolic CHF, CAD, R-AKA, and recent ESBL Ecoli Bacteremia, admitted with N/V, Acute hypoxic respiratory failure due to Aspiration pneumonia, Septic shock, UTI, and AKI.   ASSESSMENT / PLAN:  PULMONARY 1. Acute Hypoxic Respiratory Failure; Aspiration Pneumonia - intubated for severe acute hypoxic respiratory failure; CXR on my review shows bilateral infiltrates consistent with his witnessed aspiration event.  - ABG shows hypoxia for which vent was adjusted - continue CXR and ABG daily - pneumonia treatment discussed below.   CARDIOVASCULAR 1. Shock; hx HTN, Afib, CAD, Diastolic CHF - hold home  antihypertensives as patient currently is in shock - EKG shows rate controlled Afib; patient not on therapeutic anticoagulation as an outpatient for unclear reasons (due to his history of GIB?)  RENAL 1. AKI - creatinine 2.03 up from baseline 0.96; foley catheter in place; monitor UOP. Avoid nephrotoxic agents - continue IVF infusion - order Renal ultrasound   2. Hypokalemia, Hypomagnesemia - repleting with 40MEQ KCL IV and 2g Magnesium sulfate IV - repeat BMP at 11am  GASTROINTESTINAL 1. N/V:  - possibly due to the sepsis itself; will obtain KUB to r/o SBO  HEMATOLOGIC 1. Hx Anemia: - Hgb 9;5 with baseline Hgb 7-9; continue to trend  INFECTIOUS 1. Septic shock: due to UTI and Aspiration pneumonia; recent ESBL  Ecoli Bacteremia - per EMR patient with recent ESBL Ecoli bacteremia; also has history of MRSA. Therefore will cover with Vancomycin and Meropenem. - pancultured. Lactate initially 4.47 improved to 3.7 after IVF; continue to trend. Procalcitonin elevated at 5.95; repeat daily.  - he is s/p 5L IVF bolus and continues on NS infusion. Continue Levophed and Vasopressin and wean as tolerated. Continue hydrocortisone.  - CVC placed by ER; will need Aline also.   ENDOCRINE 1. DM: - NPO; cover with SSI  NEUROLOGIC 1. Acute Encephalopathy: - likely due to his critical illness with septic shock and severe hypoxia; no focal findings on exam but is deeply sedated. Once more stable if concern exists for possible anoxic encephalopathy, may consider obtaining Head CT.    FAMILY  - Updates: no family present at time of my exam - Inter-disciplinary family meet or Palliative Care meeting due by:  10/05/17  60 minutes critical care time  Vernie Murders, MD Pulmonary and [place] Pager: [phone]  09/29/2017, 7:13 AM

## 2017-09-29 NOTE — Progress Notes (Signed)
A consult was received from an ED physician for cefepime and vancomycin per pharmacy dosing.  The patient's profile has been reviewed for ht/wt/allergies/indication/available labs.   A one time order has been placed for Cefepime 2 Gm and Vancomycin 2500 mg.  Further antibiotics/pharmacy consults should be ordered by admitting physician if indicated.                       Thank you, Lorenza Evangelist 09/29/2017  4:45 AM

## 2017-09-29 NOTE — ED Notes (Signed)
Report was received from night shift then immed called to ICU to transport patient. Resp at bedside to help transport

## 2017-09-29 NOTE — Progress Notes (Signed)
eLink Physician-Brief Progress Note Patient Name: YOGI ARTHER DOB: 1950/11/13 MRN: [id number]   Date of Service  09/29/2017  HPI/Events of Note  67 yo NHR with septic shock and HCAP, vent support  eICU Interventions  PCCM admission to ICU Vent support Pressors as needed HCAP ABX IVF's sepsis protocol     Intervention Category Evaluation Type: New Patient Evaluation  Rosaria Kubin 09/29/2017, 4:49 AM

## 2017-09-29 NOTE — Progress Notes (Signed)
A-line on hold due to transporting pt to ICU.

## 2017-09-29 NOTE — ED Notes (Signed)
I gave Md Preston Fleeting critical I Stat CG4 result

## 2017-09-30 ENCOUNTER — Inpatient Hospital Stay (HOSPITAL_COMMUNITY): Payer: Medicare Other

## 2017-09-30 DIAGNOSIS — R0603 Acute respiratory distress: Secondary | ICD-10-CM

## 2017-09-30 LAB — BLOOD GAS, ARTERIAL
Acid-base deficit: 6.5 mmol/L — ABNORMAL HIGH (ref 0.0–2.0)
BICARBONATE: 17.2 mmol/L — AB (ref 20.0–28.0)
Drawn by: 11249
FIO2: 50
LHR: 18 {breaths}/min
MECHVT: 620 mL
O2 Saturation: 98.8 %
PEEP: 5 cmH2O
Patient temperature: 99.8
pCO2 arterial: 30.4 mmHg — ABNORMAL LOW (ref 32.0–48.0)
pH, Arterial: 7.375 (ref 7.350–7.450)
pO2, Arterial: 143 mmHg — ABNORMAL HIGH (ref 83.0–108.0)

## 2017-09-30 LAB — PROCALCITONIN: PROCALCITONIN: 20.14 ng/mL

## 2017-09-30 LAB — GLUCOSE, CAPILLARY
GLUCOSE-CAPILLARY: 131 mg/dL — AB (ref 65–99)
GLUCOSE-CAPILLARY: 134 mg/dL — AB (ref 65–99)
GLUCOSE-CAPILLARY: 138 mg/dL — AB (ref 65–99)
GLUCOSE-CAPILLARY: 151 mg/dL — AB (ref 65–99)
GLUCOSE-CAPILLARY: 156 mg/dL — AB (ref 65–99)
GLUCOSE-CAPILLARY: 158 mg/dL — AB (ref 65–99)
GLUCOSE-CAPILLARY: 162 mg/dL — AB (ref 65–99)
GLUCOSE-CAPILLARY: 165 mg/dL — AB (ref 65–99)
GLUCOSE-CAPILLARY: 171 mg/dL — AB (ref 65–99)
Glucose-Capillary: 248 mg/dL — ABNORMAL HIGH (ref 65–99)
Glucose-Capillary: 205 mg/dL — ABNORMAL HIGH (ref 65–99)
Glucose-Capillary: 208 mg/dL — ABNORMAL HIGH (ref 65–99)
Glucose-Capillary: 152 mg/dL — ABNORMAL HIGH (ref 65–99)
Glucose-Capillary: 143 mg/dL — ABNORMAL HIGH (ref 65–99)
Glucose-Capillary: 128 mg/dL — ABNORMAL HIGH (ref 65–99)
Glucose-Capillary: 141 mg/dL — ABNORMAL HIGH (ref 65–99)
Glucose-Capillary: 152 mg/dL — ABNORMAL HIGH (ref 65–99)
Glucose-Capillary: 184 mg/dL — ABNORMAL HIGH (ref 65–99)
Glucose-Capillary: 164 mg/dL — ABNORMAL HIGH (ref 65–99)

## 2017-09-30 LAB — BLOOD CULTURE ID PANEL (REFLEXED)
Acinetobacter baumannii: NOT DETECTED
CANDIDA GLABRATA: NOT DETECTED
Candida albicans: NOT DETECTED
Candida krusei: NOT DETECTED
Candida parapsilosis: NOT DETECTED
Candida tropicalis: NOT DETECTED
ENTEROBACTER CLOACAE COMPLEX: NOT DETECTED
Enterobacteriaceae species: NOT DETECTED
Enterococcus species: NOT DETECTED
Escherichia coli: NOT DETECTED
Haemophilus influenzae: NOT DETECTED
KLEBSIELLA PNEUMONIAE: NOT DETECTED
Klebsiella oxytoca: NOT DETECTED
Listeria monocytogenes: NOT DETECTED
NEISSERIA MENINGITIDIS: NOT DETECTED
PROTEUS SPECIES: NOT DETECTED
Pseudomonas aeruginosa: NOT DETECTED
STAPHYLOCOCCUS SPECIES: NOT DETECTED
STREPTOCOCCUS AGALACTIAE: NOT DETECTED
Serratia marcescens: NOT DETECTED
Staphylococcus aureus (BCID): NOT DETECTED
Streptococcus pneumoniae: NOT DETECTED
Streptococcus pyogenes: NOT DETECTED
Streptococcus species: NOT DETECTED

## 2017-09-30 LAB — ECHOCARDIOGRAM COMPLETE
HEIGHTINCHES: 72 in
WEIGHTICAEL: 4130.54 [oz_av]

## 2017-09-30 LAB — BASIC METABOLIC PANEL
Anion gap: 11 (ref 5–15)
BUN: 37 mg/dL — ABNORMAL HIGH (ref 6–20)
CALCIUM: 6.6 mg/dL — AB (ref 8.9–10.3)
CO2: 16 mmol/L — ABNORMAL LOW (ref 22–32)
CREATININE: 1.44 mg/dL — AB (ref 0.61–1.24)
Chloride: 106 mmol/L (ref 101–111)
GFR calc Af Amer: 57 mL/min — ABNORMAL LOW (ref >60)
GFR, EST NON AFRICAN AMERICAN: 50 mL/min — AB (ref >60)
Glucose, Bld: 207 mg/dL — ABNORMAL HIGH (ref 65–99)
Potassium: 2.7 mmol/L — CL (ref 3.5–5.1)
SODIUM: 133 mmol/L — AB (ref 135–145)

## 2017-09-30 LAB — CBC
HCT: 27.4 % — ABNORMAL LOW (ref 39.0–52.0)
Hemoglobin: 8.6 g/dL — ABNORMAL LOW (ref 13.0–17.0)
MCH: 22.3 pg — AB (ref 26.0–34.0)
MCHC: 31.4 g/dL (ref 30.0–36.0)
MCV: 71.2 fL — ABNORMAL LOW (ref 78.0–100.0)
Platelets: 636 10*3/uL — ABNORMAL HIGH (ref 150–400)
RBC: 3.85 MIL/uL — ABNORMAL LOW (ref 4.22–5.81)
RDW: 21.7 % — AB (ref 11.5–15.5)
WBC: 31.6 10*3/uL — ABNORMAL HIGH (ref 4.0–10.5)

## 2017-09-30 LAB — C DIFFICILE QUICK SCREEN W PCR REFLEX
C DIFFICILE (CDIFF) TOXIN: NEGATIVE
C DIFFICLE (CDIFF) ANTIGEN: POSITIVE — AB

## 2017-09-30 LAB — PHOSPHORUS: PHOSPHORUS: 1.7 mg/dL — AB (ref 2.5–4.6)

## 2017-09-30 LAB — CLOSTRIDIUM DIFFICILE BY PCR, REFLEXED: CDIFFPCR: POSITIVE — AB

## 2017-09-30 LAB — MAGNESIUM: MAGNESIUM: 2.1 mg/dL (ref 1.7–2.4)

## 2017-09-30 MED ORDER — CHLORHEXIDINE GLUCONATE 0.12% ORAL RINSE (MEDLINE KIT)
15.0000 mL | Freq: Two times a day (BID) | OROMUCOSAL | Status: DC
  Administered 2017-09-30 – 2017-10-14 (×11): 15 mL via OROMUCOSAL

## 2017-09-30 MED ORDER — CHLORHEXIDINE GLUCONATE CLOTH 2 % EX PADS
6.0000 | MEDICATED_PAD | Freq: Every day | CUTANEOUS | Status: DC
  Administered 2017-10-01 – 2017-10-07 (×6): 6 via TOPICAL

## 2017-09-30 MED ORDER — PERFLUTREN LIPID MICROSPHERE
1.0000 mL | INTRAVENOUS | Status: AC | PRN
  Administered 2017-09-30: 2 mL via INTRAVENOUS
  Filled 2017-09-30: qty 10

## 2017-09-30 MED ORDER — PERFLUTREN LIPID MICROSPHERE
INTRAVENOUS | Status: AC
  Filled 2017-09-30: qty 10

## 2017-09-30 MED ORDER — ENOXAPARIN SODIUM 120 MG/0.8ML ~~LOC~~ SOLN
120.0000 mg | Freq: Two times a day (BID) | SUBCUTANEOUS | Status: DC
  Administered 2017-09-30 – 2017-10-06 (×12): 120 mg via SUBCUTANEOUS
  Filled 2017-09-30 (×13): qty 0.8

## 2017-09-30 MED ORDER — SODIUM CHLORIDE 0.9% FLUSH
10.0000 mL | INTRAVENOUS | Status: DC | PRN
  Administered 2017-10-12: 10 mL
  Filled 2017-09-30: qty 40

## 2017-09-30 MED ORDER — POTASSIUM CHLORIDE 20 MEQ PO PACK
40.0000 meq | PACK | Freq: Once | ORAL | Status: AC
  Administered 2017-09-30: 40 meq
  Filled 2017-09-30: qty 2

## 2017-09-30 MED ORDER — ORAL CARE MOUTH RINSE
15.0000 mL | Freq: Four times a day (QID) | OROMUCOSAL | Status: DC
  Administered 2017-09-30 – 2017-10-02 (×11): 15 mL via OROMUCOSAL

## 2017-09-30 MED ORDER — POTASSIUM PHOSPHATES 15 MMOLE/5ML IV SOLN
40.0000 meq | Freq: Once | INTRAVENOUS | Status: AC
  Administered 2017-09-30: 40 meq via INTRAVENOUS
  Filled 2017-09-30: qty 9.09

## 2017-09-30 NOTE — Progress Notes (Signed)
ANTICOAGULATION CONSULT NOTE - Initial Consult  Pharmacy Consult for LMWH Indication: atrial fibrillation- bridge therapy while NPO and unable to take Xarelto  No Known Allergies  Patient Measurements: Height: 6' (182.9 cm)(per pt) Weight: 258 lb 2.5 oz (117.1 kg) IBW/kg (Calculated) : 77.6   Vital Signs: Temp: 99.3 F (37.4 C) (03/22 1215) BP: 123/44 (03/22 1130) Pulse Rate: 85 (03/22 1215)  Labs: Recent Labs    09/29/17 0441 09/29/17 0553 09/29/17 1123 09/29/17 1747 09/30/17 0342  HGB 9.5*  --   --   --  8.6*  HCT 30.9*  --   --   --  27.4*  PLT 737*  --   --   --  636*  LABPROT  --  31.3*  --   --   --   INR  --  3.04  --   --   --   CREATININE 2.03*  --  1.70* 1.60* 1.44*  TROPONINI  --  0.04*  --   --   --     Estimated Creatinine Clearance: 67.6 mL/min (A) (by C-G formula based on SCr of 1.44 mg/dL (H)).   Medical History: Past Medical History:  Diagnosis Date  . Arthritis    "hands and lower back" (09/19/2014)  . CAD (coronary artery disease)    a. s/p PCI to RCA in 2012. b. prior cath in 01/2014 with elevated L/RH pressures, mild-mod CAD of LAD/LCx with patent RCA, normal EF, c/b CIN/CHF.  [name] Carpal tunnel syndrome, bilateral   . Cellulitis   . Chronic diastolic CHF (congestive heart failure) ([place])   . Chronic kidney disease (CKD), stage III (moderate) (HCC)   . Chronic lower back pain   . Diabetes ([place])   . DKA (diabetic ketoacidoses) ([place]) 03/2017  . History of blood transfusion ~ 1954   "related to OR"  . Hyperlipidemia   . Hypertension   . Hypoxia    a. Qualified for home O2 at DC in 09/2014.  [name] Lower GI bleed   . Microcytic anemia   . Morbid obesity ([place])   . Neuropathy   . OSA (obstructive sleep apnea)    "I wear nasal prongs; haven't been using prongs recently" (09/19/2014)  . PAF (paroxysmal atrial fibrillation) ([place])    TEE DCCV 09/23/2014  . Physical deconditioning   . Pilonidal cyst 1980's; 01/25/2013  . Scrotal abscess   . Type II  diabetes mellitus (HCC)     Medications:  Medications Prior to Admission  Medication Sig Dispense Refill Last Dose  . acetaminophen (TYLENOL) 500 MG tablet Take 500 mg by mouth every 6 (six) hours as needed for mild pain.    unknown  . aluminum-magnesium hydroxide-simethicone (MAALOX) 944-967-59 MG/5ML SUSP Take 30 mLs by mouth every 4 (four) hours as needed (heartburn).   09/22/2017 at 1900  . Amino Acids-Protein Hydrolys (FEEDING SUPPLEMENT, PRO-STAT SUGAR FREE 64,) LIQD Take 30 mLs by mouth 3 (three) times daily with meals. 900 mL 0 09/28/2017 at 2100  . aspirin EC 81 MG EC tablet Take 1 tablet (81 mg total) by mouth daily. 30 tablet 2 09/28/2017 at 0900  . atorvastatin (LIPITOR) 80 MG tablet Take 1 tablet (80 mg total) by mouth daily. 30 tablet 10 09/28/2017 at 2100  . calcium-vitamin D (OSCAL WITH D) 500-200 MG-UNIT tablet Take 1 tablet by mouth daily.   09/28/2017 at 0800  . collagenase (SANTYL) ointment Apply 1 application topically daily.   09/28/2017 at Unknown time  . diltiazem (CARDIZEM) 120 MG  tablet Take 1 tablet (120 mg total) by mouth every 6 (six) hours. 120 tablet 0 09/28/2017 at 1800  . feeding supplement, GLUCERNA SHAKE, (GLUCERNA SHAKE) LIQD Take 237 mLs by mouth 3 (three) times daily between meals. 30 Can 0 09/28/2017 at 2100  . ferrous sulfate 325 (65 FE) MG tablet Take 1 tablet (325 mg total) by mouth 2 (two) times daily with a meal. 30 tablet 0 09/28/2017 at 1700  . gabapentin (NEURONTIN) 300 MG capsule Take 2 capsules (600 mg total) by mouth 2 (two) times daily. 60 capsule 3 09/28/2017 at 1700  . hydrocerin (EUCERIN) CREA Apply 1 application topically 3 (three) times daily. legs 454 g 3 09/28/2017 at 2100  . insulin aspart (NOVOLOG FLEXPEN) 100 UNIT/ML FlexPen Inject 2-10 Units into the skin 4 (four) times daily -  with meals and at bedtime. Per sliding scale.. 150-200=2 units 201-250=4 units 251-300=6 units 301-350=8 units 351-400=10 units Over 400 call MD   09/28/2017 at 2100   . insulin detemir (LEVEMIR) 100 UNIT/ML injection Inject 40 Units into the skin daily.    09/28/2017 at 2000  . lidocaine (LMX) 4 % cream Apply topically daily as needed (during hydro). 30 g 0 unknown  . loperamide (IMODIUM) 2 MG capsule Take 1 capsule (2 mg total) by mouth every 6 (six) hours as needed for diarrhea or loose stools. 30 capsule 0 09/28/2017 at 1951  . metoprolol succinate (TOPROL-XL) 50 MG 24 hr tablet Take 50 mg by mouth daily. Take with or immediately following a meal.   09/28/2017 at 0900  . Multiple Vitamin (MULTIVITAMIN WITH MINERALS) TABS Take 1 tablet by mouth daily.   09/28/2017 at 0900  . ondansetron (ZOFRAN) 4 MG tablet Take 4 mg by mouth every 8 (eight) hours as needed for nausea or vomiting.   09/25/2017 at 1137  . Oxycodone HCl 20 MG TABS Take 20 mg by mouth every 4 (four) hours as needed (pain).   09/28/2017 at 2002  . potassium & sodium phosphates (PHOS-NAK) 280-160-250 MG PACK Take 1 packet by mouth 4 (four) times daily -  with meals and at bedtime. 120 packet 0 09/28/2017 at 2100  . potassium chloride SA (K-DUR,KLOR-CON) 20 MEQ tablet Take 40 mEq by mouth every 6 (six) hours. For 2 days   09/28/2017 at 1800  . XARELTO 20 MG TABS tablet TAKE 1 TABLET BY MOUTH EVERY DAY WITH SUPPER 30 tablet 0 09/28/2017 at 1700  . calcium carbonate (OS-CAL - DOSED IN MG OF ELEMENTAL CALCIUM) 1250 (500 Ca) MG tablet Take 1 tablet (500 mg of elemental calcium total) by mouth 3 (three) times daily with meals. (Patient not taking: Reported on 09/29/2017)   Not Taking at Unknown time  . meropenem (MERREM) IVPB Inject 1 g into the vein every 8 (eight) hours. Indication:  ESBL C Coli Bacteremia Last Day of Therapy:  09/16/2017 Labs - Once weekly:  CBC/D and BMP, Labs - Every other week:  ESR and CRP (Patient not taking: Reported on 09/29/2017) 11 Units 0 Not Taking at Unknown time  . oxyCODONE (ROXICODONE) 15 MG immediate release tablet Take 1 tablet (15 mg total) by mouth every 4 (four) hours as needed  for pain. Hold for Sedation (Patient not taking: Reported on 09/01/2017) 60 tablet 0 Not Taking at Unknown time  . OXYGEN Inhale 2-3 L into the lungs continuous.   unk    Assessment: 67 yo M on Xarelto PTA for Afib, last dose 3/20 at 2100.  He was given  LMWH 40 mg sq x 1 dose 3/21 at 1139 am.   Admit INR elevated at 3 due to Xarelto lab interference.  ARF resolving, Cr 2>>1.44.   Hg 8.5, PMH GIB.    Goal of Therapy:  Anti-Xa level 0.6-1 units/ml 4hrs after LMWH dose given Monitor platelets by anticoagulation protocol: Yes   Plan:  LMWH 1 mg/kg sq q12h = 120 sq q12 - start at 2200 F/u creatine and CBC  Eudelia Bunch, Pharm.D. [phone] 09/30/2017 1:05 PM

## 2017-09-30 NOTE — Progress Notes (Signed)
PHARMACY - PHYSICIAN COMMUNICATION CRITICAL VALUE ALERT - BLOOD CULTURE IDENTIFICATION (BCID)  [name] is an 67 y.o. male who presented to [facility] on 09/29/2017 from SNF with a chief complaint of Hypoxia.  Assessment:  BCID positive in 1 of 4, anaerobic bottle, resulted Gram Variable Rod, organism not identified  Name of physician (or Provider) Contacted: Dr. Craige Cotta  Current antibiotics: Meropenem 1gm q8 for ESBL E coli bacteremia  Changes to prescribed antibiotics recommended:  Patient is on recommended antibiotics - No changes needed  Results for orders placed or performed during the hospital encounter of 09/29/17  Blood Culture ID Panel (Reflexed) (Collected: 09/29/2017  4:34 AM)  Result Value Ref Range   Enterococcus species NOT DETECTED NOT DETECTED   Listeria monocytogenes NOT DETECTED NOT DETECTED   Staphylococcus species NOT DETECTED NOT DETECTED   Staphylococcus aureus NOT DETECTED NOT DETECTED   Streptococcus species NOT DETECTED NOT DETECTED   Streptococcus agalactiae NOT DETECTED NOT DETECTED   Streptococcus pneumoniae NOT DETECTED NOT DETECTED   Streptococcus pyogenes NOT DETECTED NOT DETECTED   Acinetobacter baumannii NOT DETECTED NOT DETECTED   Enterobacteriaceae species NOT DETECTED NOT DETECTED   Enterobacter cloacae complex NOT DETECTED NOT DETECTED   Escherichia coli NOT DETECTED NOT DETECTED   Klebsiella oxytoca NOT DETECTED NOT DETECTED   Klebsiella pneumoniae NOT DETECTED NOT DETECTED   Proteus species NOT DETECTED NOT DETECTED   Serratia marcescens NOT DETECTED NOT DETECTED   Haemophilus influenzae NOT DETECTED NOT DETECTED   Neisseria meningitidis NOT DETECTED NOT DETECTED   Pseudomonas aeruginosa NOT DETECTED NOT DETECTED   Candida albicans NOT DETECTED NOT DETECTED   Candida glabrata NOT DETECTED NOT DETECTED   Candida krusei NOT DETECTED NOT DETECTED   Candida parapsilosis NOT DETECTED NOT DETECTED   Candida tropicalis NOT DETECTED NOT  DETECTED    Otho Bellows PharmD Pager [phone] 09/30/2017, 3:25 PM

## 2017-09-30 NOTE — Progress Notes (Signed)
  Echocardiogram 2D Echocardiogram has been performed.  Acelin Ferdig G Gerrit Rafalski 09/30/2017, 10:19 AM

## 2017-09-30 NOTE — Progress Notes (Signed)
PULMONARY / CRITICAL CARE MEDICINE   Name: [name] MRN: [id number] DOB: [birth year]    ADMISSION DATE:  09/29/2017  REFERRING MD:  Dr. Preston Fleeting, ER  CHIEF COMPLAINT:  Vomiting  HISTORY OF PRESENT ILLNESS:   67 yo male former smoker from [facility] with vomiting, altered mental status, hypoxia.  Intubated in ER and started on pressors for septic shock with concern for aspiration pneumonia.  Found to have severe hypokalemia.  PMHx of CAD, diastolic CHF, CKD 3, DM, HLD, HTN, OSA, Neuropathy, GI bleeding, A fib, PAD s/p Rt AKA, ESBL E coli bacteremia.  SUBJECTIVE:  Remains on pressors, vent.  Dark brown material draining from OG tube.  VITAL SIGNS: BP (!) 142/48   Pulse 81   Temp 99.1 F (37.3 C)   Resp 15   Ht 6' (1.829 m) Comment: per pt  Wt 258 lb 2.5 oz (117.1 kg)   SpO2 100%   BMI 35.01 kg/m   VENTILATOR SETTINGS: Vent Mode: PRVC FiO2 (%):  [40 %-60 %] 40 % Set Rate:  [18 bmp] 18 bmp Vt Set:  [620 mL] 620 mL PEEP:  [5 cmH20] 5 cmH20 Plateau Pressure:  [13 cmH20-27 cmH20] 16 cmH20  INTAKE / OUTPUT: I/O last 3 completed shifts: In: 8858.3 [I.V.:3258.3; IV Piggyback:5600] Out: 2525 [Urine:1375; Emesis/NG output:1150]  PHYSICAL EXAMINATION:  General - sedated Eyes - pupils reactive ENT - ETT in place Cardiac - regular, no murmur Chest - b/l rhonchi Abd - soft, non tender Ext - Rt AKA stump clean Skin - sacral wound Neuro - RASS -2   LABS:  BMET Recent Labs  Lab 09/29/17 1123 09/29/17 1747 09/30/17 0342  NA 132* 132* 133*  K 2.2* 2.4* 2.7*  CL 102 105 106  CO2 18* 17* 16*  BUN 39* 39* 37*  CREATININE 1.70* 1.60* 1.44*  GLUCOSE 218* 269* 207*    Electrolytes Recent Labs  Lab 09/29/17 0553 09/29/17 1123 09/29/17 1747 09/30/17 0342  CALCIUM  --  6.5* 6.3* 6.6*  MG 1.7  --  1.9 2.1  PHOS 2.7  --   --  1.7*    CBC Recent Labs  Lab 09/29/17 0441 09/30/17 0342  WBC 39.4* 31.6*  HGB 9.5* 8.6*  HCT 30.9* 27.4*  PLT 737* 636*     Coag's Recent Labs  Lab 09/29/17 0553  INR 3.04    Sepsis Markers Recent Labs  Lab 09/29/17 0458 09/29/17 0553 09/29/17 0848 09/30/17 0342  LATICACIDVEN 4.47* 3.7* 3.1*  --   PROCALCITON  --  5.95  --  20.14    ABG Recent Labs  Lab 09/29/17 0531 09/30/17 0359  PHART 7.381 7.375  PCO2ART 35.9 30.4*  PO2ART 57.7* 143*    Liver Enzymes Recent Labs  Lab 09/29/17 0441  AST 29  ALT 16*  ALKPHOS 339*  BILITOT 0.5  ALBUMIN 1.8*    Cardiac Enzymes Recent Labs  Lab 09/29/17 0553  TROPONINI 0.04*    Glucose Recent Labs  Lab 09/30/17 0326 09/30/17 0433 09/30/17 0545 09/30/17 0650 09/30/17 0800 09/30/17 0908  GLUCAP 208* 162* 134* 138* 156* 152*    Imaging Dg Abd 1 View  Result Date: 09/29/2017 CLINICAL DATA:  Nasogastric tube placement EXAM: ABDOMEN - 1 VIEW COMPARISON:  Portable exam 1558 hours compared to CT abdomen and pelvis 09/15/2015 FINDINGS: Tip of nasogastric tube projects over mid stomach. Gas distention of a bowel loop in mid abdomen up to 11.3 cm diameter, question transverse colon. No definite bowel wall thickening. Exclusion  of the pelvis and RIGHT lateral aspect of abdomen. Minimal gaseous distention of stomach. Atelectasis versus consolidation in LEFT lower lobe. Bones demineralized. IMPRESSION: Tip of nasogastric tube projects over mid stomach. Gaseous distention of a bowel loop in the LEFT mid abdomen, probably transverse colon, 11.3 cm diameter. Electronically Signed   By: Ulyses Southward M.D.   On: 09/29/2017 16:17   Dg Chest Port 1 View  Result Date: 09/30/2017 CLINICAL DATA:  Check endotracheal tube position EXAM: PORTABLE CHEST 1 VIEW COMPARISON:  09/29/2017 FINDINGS: Endotracheal tube and nasogastric catheter are noted in satisfactory position. Cardiac shadow is mildly prominent but stable. Persistent perihilar changes are noted although slightly improved when compared with the prior exam. No new focal infiltrate is seen. IMPRESSION:  Slight improved aeration. Tubes and lines as described. Electronically Signed   By: Alcide Clever M.D.   On: 09/30/2017 06:56     STUDIES:  Renal u/s 3/21 >> normal kidneys Echo 3/22 >>   CULTURES: Blood 3/21 >> Urine 3/21 >> Sputum 3/21 >>   ANTIBIOTICS: Vancomycin 3/21 >> Meropenem 3/21 >>   SIGNIFICANT EVENTS: 3/21 Admit  LINES/TUBES: ETT 3/21 >>   DISCUSSION: Mental status hasn't improved.  Remains on pressors, but likely can wean off.  Still has low K.  ASSESSMENT / PLAN:  Septic shock. Aspiration pneumonia. Unstageable sacral wound >> present prior to admission. Relative adrenal insufficiency. - wean pressors to keep MAP > 65  - once of levophed, then can d/c vasopressin - d/c femoral CVL after he is off pressors - continue IV fluids - continue Abx - wound care - solu cortef while he is on pressors  Acute respiratory failure. Hx of OSA. - full vent support - f/u CXR - prn BDs - not ready for vent weaning  Bradycardia from hypokalemia >> improved. Hx of CAD, chronic diastolic CHF, A fib, HTN. - monitor on telemetry - hold outpt lipitor, ASA, cardizem, toprol, xarelto  Acute renal failure >> baseline creatinine 0.96. Hypokalemia, hypomagnesemia, hypophosphatemia. Lactic acidosis >> imrpoved. - f/u BMET - replace electrolytes as needed  Anemia of critical illness and chronic disease. - f/u CBC  Nausea and vomiting. - OG tube to suction - prn zofran  Acute metabolic encephalopathy. - monitor mental status - hold outpt neurontin, oxycodone  DM type II. - ICU hyperglycemia protocol phase 2  DVT prophylaxis - SCDs SUP - pepcid Nutrition - NPO Goals of care - full code; reported to have out of hospital DNR form >> will need to clarify with his family.  CC time 32 minutes  Coralyn Helling, MD [facility] Pulmonary/Critical Care 09/30/2017, 9:45 AM Pager:  [phone] After 3pm call: [phone]

## 2017-09-30 NOTE — Progress Notes (Signed)
CRITICAL VALUE ALERT  Critical Value:  K+ 2.7  Date & Time Notified: 09/30/17 @ 0453  Provider Notified: E-link  Orders Received/Actions taken: pending

## 2017-10-01 ENCOUNTER — Inpatient Hospital Stay (HOSPITAL_COMMUNITY): Payer: Medicare Other

## 2017-10-01 LAB — GLUCOSE, CAPILLARY
GLUCOSE-CAPILLARY: 116 mg/dL — AB (ref 65–99)
GLUCOSE-CAPILLARY: 125 mg/dL — AB (ref 65–99)
GLUCOSE-CAPILLARY: 139 mg/dL — AB (ref 65–99)
GLUCOSE-CAPILLARY: 149 mg/dL — AB (ref 65–99)
GLUCOSE-CAPILLARY: 159 mg/dL — AB (ref 65–99)
GLUCOSE-CAPILLARY: 160 mg/dL — AB (ref 65–99)
GLUCOSE-CAPILLARY: 189 mg/dL — AB (ref 65–99)
GLUCOSE-CAPILLARY: 189 mg/dL — AB (ref 65–99)
GLUCOSE-CAPILLARY: 208 mg/dL — AB (ref 65–99)
Glucose-Capillary: 169 mg/dL — ABNORMAL HIGH (ref 65–99)
Glucose-Capillary: 142 mg/dL — ABNORMAL HIGH (ref 65–99)
Glucose-Capillary: 169 mg/dL — ABNORMAL HIGH (ref 65–99)
Glucose-Capillary: 159 mg/dL — ABNORMAL HIGH (ref 65–99)
Glucose-Capillary: 159 mg/dL — ABNORMAL HIGH (ref 65–99)
Glucose-Capillary: 140 mg/dL — ABNORMAL HIGH (ref 65–99)
Glucose-Capillary: 158 mg/dL — ABNORMAL HIGH (ref 65–99)
Glucose-Capillary: 204 mg/dL — ABNORMAL HIGH (ref 65–99)
Glucose-Capillary: 212 mg/dL — ABNORMAL HIGH (ref 65–99)
Glucose-Capillary: 170 mg/dL — ABNORMAL HIGH (ref 65–99)
Glucose-Capillary: 180 mg/dL — ABNORMAL HIGH (ref 65–99)
Glucose-Capillary: 170 mg/dL — ABNORMAL HIGH (ref 65–99)
Glucose-Capillary: 167 mg/dL — ABNORMAL HIGH (ref 65–99)

## 2017-10-01 LAB — CBC
HCT: 23.1 % — ABNORMAL LOW (ref 39.0–52.0)
Hemoglobin: 7.1 g/dL — ABNORMAL LOW (ref 13.0–17.0)
MCH: 22.3 pg — ABNORMAL LOW (ref 26.0–34.0)
MCHC: 30.7 g/dL (ref 30.0–36.0)
MCV: 72.4 fL — ABNORMAL LOW (ref 78.0–100.0)
Platelets: 376 10*3/uL (ref 150–400)
RBC: 3.19 MIL/uL — ABNORMAL LOW (ref 4.22–5.81)
RDW: 22.6 % — ABNORMAL HIGH (ref 11.5–15.5)
WBC: 16.7 10*3/uL — AB (ref 4.0–10.5)

## 2017-10-01 LAB — COMPREHENSIVE METABOLIC PANEL
ALBUMIN: 1.4 g/dL — AB (ref 3.5–5.0)
ALK PHOS: 198 U/L — AB (ref 38–126)
ALT: 13 U/L — ABNORMAL LOW (ref 17–63)
ANION GAP: 10 (ref 5–15)
AST: 21 U/L (ref 15–41)
BILIRUBIN TOTAL: 0.7 mg/dL (ref 0.3–1.2)
BUN: 29 mg/dL — ABNORMAL HIGH (ref 6–20)
CALCIUM: 6.5 mg/dL — AB (ref 8.9–10.3)
CO2: 17 mmol/L — ABNORMAL LOW (ref 22–32)
Chloride: 112 mmol/L — ABNORMAL HIGH (ref 101–111)
Creatinine, Ser: 0.99 mg/dL (ref 0.61–1.24)
GLUCOSE: 185 mg/dL — AB (ref 65–99)
Potassium: 2.7 mmol/L — CL (ref 3.5–5.1)
Sodium: 139 mmol/L (ref 135–145)
TOTAL PROTEIN: 5.1 g/dL — AB (ref 6.5–8.1)

## 2017-10-01 LAB — BASIC METABOLIC PANEL
ANION GAP: 10 (ref 5–15)
BUN: 27 mg/dL — ABNORMAL HIGH (ref 6–20)
CHLORIDE: 113 mmol/L — AB (ref 101–111)
CO2: 17 mmol/L — AB (ref 22–32)
Calcium: 6.7 mg/dL — ABNORMAL LOW (ref 8.9–10.3)
Creatinine, Ser: 1.06 mg/dL (ref 0.61–1.24)
GFR calc Af Amer: >60 mL/min (ref >60)
GLUCOSE: 197 mg/dL — AB (ref 65–99)
POTASSIUM: 2.9 mmol/L — AB (ref 3.5–5.1)
Sodium: 140 mmol/L (ref 135–145)

## 2017-10-01 LAB — PHOSPHORUS
PHOSPHORUS: 2.7 mg/dL (ref 2.5–4.6)
Phosphorus: 2.2 mg/dL — ABNORMAL LOW (ref 2.5–4.6)
Phosphorus: 1.8 mg/dL — ABNORMAL LOW (ref 2.5–4.6)

## 2017-10-01 LAB — IRON AND TIBC
Iron: 21 ug/dL — ABNORMAL LOW (ref 45–182)
SATURATION RATIOS: 21 % (ref 17.9–39.5)
TIBC: 98 ug/dL — ABNORMAL LOW (ref 250–450)
UIBC: 5 ug/dL

## 2017-10-01 LAB — FERRITIN: FERRITIN: 322 ng/mL (ref 24–336)

## 2017-10-01 LAB — MAGNESIUM
MAGNESIUM: 2 mg/dL (ref 1.7–2.4)
MAGNESIUM: 2 mg/dL (ref 1.7–2.4)
Magnesium: 1.9 mg/dL (ref 1.7–2.4)

## 2017-10-01 LAB — PROCALCITONIN: Procalcitonin: 7.27 ng/mL

## 2017-10-01 MED ORDER — POTASSIUM CHLORIDE 10 MEQ/50ML IV SOLN
10.0000 meq | INTRAVENOUS | Status: AC
  Administered 2017-10-01 (×6): 10 meq via INTRAVENOUS
  Filled 2017-10-01 (×6): qty 50

## 2017-10-01 MED ORDER — RANITIDINE HCL 150 MG/10ML PO SYRP
150.0000 mg | ORAL_SOLUTION | Freq: Two times a day (BID) | ORAL | Status: DC
  Administered 2017-10-01 (×2): 150 mg
  Filled 2017-10-01 (×3): qty 10

## 2017-10-01 MED ORDER — VANCOMYCIN 50 MG/ML ORAL SOLUTION
125.0000 mg | Freq: Four times a day (QID) | ORAL | Status: AC
  Administered 2017-10-01 – 2017-10-10 (×40): 125 mg via ORAL
  Filled 2017-10-01 (×44): qty 2.5

## 2017-10-01 MED ORDER — POTASSIUM PHOSPHATES 15 MMOLE/5ML IV SOLN
20.0000 mmol | Freq: Once | INTRAVENOUS | Status: AC
  Administered 2017-10-01: 20 mmol via INTRAVENOUS
  Filled 2017-10-01: qty 6.67

## 2017-10-01 MED ORDER — VITAL HIGH PROTEIN PO LIQD
1000.0000 mL | ORAL | Status: DC
  Administered 2017-10-01 (×2): 1000 mL
  Filled 2017-10-01 (×2): qty 1000

## 2017-10-01 MED ORDER — PRO-STAT SUGAR FREE PO LIQD
60.0000 mL | Freq: Two times a day (BID) | ORAL | Status: DC
  Administered 2017-10-01 (×2): 60 mL
  Filled 2017-10-01 (×2): qty 60

## 2017-10-01 MED ORDER — ACETAMINOPHEN 325 MG PO TABS: 650.0000 mg | ORAL_TABLET | ORAL | Status: DC | PRN

## 2017-10-01 MED ORDER — VITAL HIGH PROTEIN PO LIQD
1000.0000 mL | ORAL | Status: DC
  Filled 2017-10-01: qty 1000

## 2017-10-01 MED ORDER — FAMOTIDINE 40 MG/5ML PO SUSR
20.0000 mg | Freq: Two times a day (BID) | ORAL | Status: DC
  Filled 2017-10-01: qty 2.5

## 2017-10-01 MED ORDER — PRO-STAT SUGAR FREE PO LIQD: 30.0000 mL | Freq: Two times a day (BID) | ORAL | Status: DC

## 2017-10-01 NOTE — Progress Notes (Signed)
CRITICAL VALUE ALERT  Critical Value:  K+ 2.7  Date & Time Notied:  10/01/17 @ 0605  Provider Notified: yes  Orders Received/Actions taken: pending

## 2017-10-01 NOTE — Procedures (Signed)
[name] was pulled from site RT had to DC the [name]

## 2017-10-01 NOTE — Progress Notes (Signed)
eLink Physician-Brief Progress Note Patient Name: [name] DOB: 05/26/1951 MRN: [id number]   Date of Service  10/01/2017  HPI/Events of Note  Low potassium  eICU Interventions  replaced     Intervention Category Minor Interventions: Electrolytes abnormality - evaluation and management  Henry Russel, P 10/01/2017, 6:12 AM

## 2017-10-01 NOTE — Progress Notes (Signed)
Nutrition Follow-up  DOCUMENTATION CODES:   Obesity unspecified  INTERVENTION:   Monitor magnesium, potassium, and phosphorus daily for at least 3 days, MD to replete as needed, as pt is at risk for refeeding syndrome given severe hypokalemia.  -Initiate Vital HP @ 20 ml/hr, advance by 10 ml every 12 hours to goal rate of 50 ml/hr. -60 ml Prostat BID -At goal rate this will provide 1600 kcal, 165 grams of protein, and 1003 mL free water.  NUTRITION DIAGNOSIS:   Inadequate oral intake related to inability to eat as evidenced by NPO status.  Ongoing.  GOAL:   Provide needs based on ASPEN/SCCM guidelines  Progressing.  MONITOR:   Vent status, Weight trends, Labs, Skin(plan concerning nutrition support)  REASON FOR ASSESSMENT:   Consult Enteral/tube feeding initiation and management  ASSESSMENT:   67 yo male with PMH of DM, morbid obesity, Afib, OSA, anemia, HTN, HLD, GIB, CHF, CAD, R-AKA, and recent ESBL Ecoli Bacteremia. He presented to the ED from nursing home. EMS was called for N/V and when they arrived they found patient hypoxic and minimally responsive. He was emergently intubated in the ED and found to be in septic shock requiring vasopressors.  TF protocol initiated this morning. RD adjusted orders to recommendations provided on 3/21. Needs have remained the same given ASPEN guidelines.   Patient is currently intubated on ventilator support MV: 6.7 L/min Temp (24hrs), Avg:99.1 F (37.3 C), Min:98.8 F (37.1 C), Max:99.3 F (37.4 C)  Medications: KCl infusion Labs reviewed: CBGs: 116-140 Low K Mg/Phos WNL  Diet Order:  Diet NPO time specified  EDUCATION NEEDS:   No education needs have been identified at this time  Skin:  Skin Assessment: Skin Integrity Issues: Skin Integrity Issues:: Unstageable Unstageable: full thickness sacrum Incisions: R AKA done on 09/01/17 (area healed)  Last BM:  PTA/unknown  Height:   Ht Readings from Last 1  Encounters:  09/29/17 6' (1.829 m)    Weight:   Wt Readings from Last 1 Encounters:  10/01/17 257 lb 0.9 oz (116.6 kg)    Ideal Body Weight:  80.91 kg  BMI:  Body mass index is 34.86 kg/m.  Estimated Nutritional Needs:   Kcal:  1254-1596 (11-14 kcal/kg)  Protein:  >/= 162 grams (2 grams/kg IBW)  Fluid:  >/= 1.8 L/day  Tilda Franco, MS, RD, LDN [facility] Inpatient Clinical Dietitian Pager: [phone] After Hours Pager: [phone]

## 2017-10-01 NOTE — Progress Notes (Signed)
PULMONARY / CRITICAL CARE MEDICINE   Name: [name] MRN: [id number] DOB: March 13, 1951    ADMISSION DATE:  09/29/2017  REFERRING MD:  Dr. Preston Fleeting, ER  CHIEF COMPLAINT:  Vomiting  HISTORY OF PRESENT ILLNESS:   67 yo male former smoker from [facility] with vomiting, altered mental status, hypoxia.  Intubated in ER and started on pressors for septic shock with concern for aspiration pneumonia.  Found to have severe hypokalemia.  Found to also have C diff.  PMHx of CAD, diastolic CHF, CKD 3, DM, HLD, HTN, OSA, Neuropathy, GI bleeding, A fib, PAD s/p Rt AKA, ESBL E coli bacteremia.  SUBJECTIVE:  Remains on pressors.  Tolerating SBT better.  VITAL SIGNS: BP (!) 147/60   Pulse 92   Temp 99.1 F (37.3 C)   Resp 13   Ht 6' (1.829 m) Comment: per pt  Wt 257 lb 0.9 oz (116.6 kg)   SpO2 96%   BMI 34.86 kg/m   VENTILATOR SETTINGS: Vent Mode: CPAP;PSV FiO2 (%):  [40 %] 40 % Set Rate:  [18 bmp] 18 bmp Vt Set:  [620 mL] 620 mL PEEP:  [5 cmH20] 5 cmH20 Pressure Support:  [5 cmH20] 5 cmH20 Plateau Pressure:  [14 cmH20-20 cmH20] 18 cmH20  INTAKE / OUTPUT: I/O last 3 completed shifts: In: 5748.8 [I.V.:4139.8; IV Piggyback:1609.1] Out: 5225 [Urine:3225; Emesis/NG output:1700; Stool:300]  PHYSICAL EXAMINATION:  General - more alert Eyes - pupils reactive ENT - ETT in place Cardiac - regular, no murmur Chest - scattered rhonchi Abd - soft, non tender Ext - Rt leg stump clean Skin - sacral wound Neuro - follows commands   LABS:  BMET Recent Labs  Lab 09/29/17 1747 09/30/17 0342 10/01/17 0516  NA 132* 133* 139  K 2.4* 2.7* 2.7*  CL 105 106 112*  CO2 17* 16* 17*  BUN 39* 37* 29*  CREATININE 1.60* 1.44* 0.99  GLUCOSE 269* 207* 185*    Electrolytes Recent Labs  Lab 09/29/17 0553  09/29/17 1747 09/30/17 0342 10/01/17 0516  CALCIUM  --    < > 6.3* 6.6* 6.5*  MG 1.7  --  1.9 2.1 2.0  PHOS 2.7  --   --  1.7* 2.7   < > = values in this interval not  displayed.    CBC Recent Labs  Lab 09/29/17 0441 09/30/17 0342 10/01/17 0516  WBC 39.4* 31.6* 16.7*  HGB 9.5* 8.6* 7.1*  HCT 30.9* 27.4* 23.1*  PLT 737* 636* 376    Coag's Recent Labs  Lab 09/29/17 0553  INR 3.04    Sepsis Markers Recent Labs  Lab 09/29/17 0458 09/29/17 0553 09/29/17 0848 09/30/17 0342 10/01/17 0516  LATICACIDVEN 4.47* 3.7* 3.1*  --   --   PROCALCITON  --  5.95  --  20.14 7.27    ABG Recent Labs  Lab 09/29/17 0531 09/30/17 0359  PHART 7.381 7.375  PCO2ART 35.9 30.4*  PO2ART 57.7* 143*    Liver Enzymes Recent Labs  Lab 09/29/17 0441 10/01/17 0516  AST 29 21  ALT 16* 13*  ALKPHOS 339* 198*  BILITOT 0.5 0.7  ALBUMIN 1.8* 1.4*    Cardiac Enzymes Recent Labs  Lab 09/29/17 0553  TROPONINI 0.04*    Glucose Recent Labs  Lab 10/01/17 0158 10/01/17 0302 10/01/17 0404 10/01/17 0507 10/01/17 0619 10/01/17 0708  GLUCAP 142* 169* 159* 189* 189* 159*    Imaging Dg Chest Port 1 View  Result Date: 10/01/2017 CLINICAL DATA:  Respiratory failure. EXAM: PORTABLE CHEST  1 VIEW COMPARISON:  Yesterday. FINDINGS: Stable enlarged cardiac silhouette. Endotracheal tube is unchanged. Nasogastric tube extending into the stomach. Increased left lower lobe airspace opacity. No significant change in patchy opacity in the right lower lung zone and inferior perihilar region. The interstitial markings remain prominent. Thoracic spine degenerative changes. IMPRESSION: 1. Increased left lower lobe atelectasis or pneumonia. 2. No significant change in right lower lung zone atelectasis and possible pneumonia. 3. Stable cardiomegaly and chronic interstitial lung disease. Electronically Signed   By: Beckie Salts M.D.   On: 10/01/2017 07:13     STUDIES:  Renal u/s 3/21 >> normal kidneys Echo 3/22 >> mild LVH, EF 55 to 60%  CULTURES: Blood 3/21 >> Urine 3/21 >> Sputum 3/21 >>  C diff PCR 3/22 >> Ag positive, toxin negative, PCR  positive  ANTIBIOTICS: Vancomycin 3/21 >> 3/22 Meropenem 3/21 >>  Enteral vancomycin 3/23 >>    SIGNIFICANT EVENTS: 3/21 Admit  LINES/TUBES: ETT 3/21 >>  Rt femoral CVL 3/21 >>   DISCUSSION: Tolerating SBT better.  Remains on pressors.  Found to have C diff.  Mental status improved.  Still has low electrolytes.  ASSESSMENT / PLAN:  Septic shock. Aspiration pneumonia. Unstageable sacral wound >> present prior to admission. Relative adrenal insufficiency. C diff colitis. - wean pressors to keep MAP > 65 - continue IV fluids - day 3 of meropenem for pneumonia - day 1 of enteral vancomycin for C diff - wound care - continue solu cortef while on pressors  Acute respiratory failure. Hx of OSA. - pressure support wean as able >> not ready for extubation yet - f/u CXR - prn BDs  Bradycardia from hypokalemia >> resolved. Hx of CAD, chronic diastolic CHF, A fib, HTN. - using lovenox in place of xarelto for now - monitor on telemetry - hold outpt lipitor, ASA, cardizem, toprol  Acute renal failure >> baseline creatinine 0.96 > resolved. Hypokalemia, hypomagnesemia, hypophosphatemia. Lactic acidosis >> resolved. - f/u BMET - replace electrolytes as needed  Anemia of critical illness and chronic disease. - f/u CBC - check iron levels  Nausea and vomiting >> resolved. Severe protein calorie malnutrition. - tube feeds - prn zofran  Acute metabolic encephalopathy >> improved. - monitor mental status - hold outpt neurontin, oxycodone  DM type II. - ICU hyperglycemia protocol phase 2  DVT prophylaxis - lovenox SUP - pepcid Nutrition - tube feeds Goals of care - full code; reported to have out of hospital DNR form >> will need to clarify with his family.  CC time 34 minutes  Coralyn Helling, MD [facility] Pulmonary/Critical Care 10/01/2017, 8:35 AM Pager:  [phone] After 3pm call: [phone]

## 2017-10-01 NOTE — Progress Notes (Signed)
RN called and stated that the Pt's Aline may be out. RT evaluated the Aline and had to pull the aline. RT held pressure and cleaned the site. RT will continue to monitor

## 2017-10-02 ENCOUNTER — Inpatient Hospital Stay (HOSPITAL_COMMUNITY): Payer: Medicare Other

## 2017-10-02 DIAGNOSIS — J9601 Acute respiratory failure with hypoxia: Secondary | ICD-10-CM

## 2017-10-02 LAB — BASIC METABOLIC PANEL
ANION GAP: 8 (ref 5–15)
Anion gap: 10 (ref 5–15)
BUN: 27 mg/dL — AB (ref 6–20)
BUN: 26 mg/dL — AB (ref 6–20)
CALCIUM: 6.9 mg/dL — AB (ref 8.9–10.3)
CALCIUM: 6.8 mg/dL — AB (ref 8.9–10.3)
CO2: 19 mmol/L — ABNORMAL LOW (ref 22–32)
CO2: 19 mmol/L — ABNORMAL LOW (ref 22–32)
CREATININE: 0.94 mg/dL (ref 0.61–1.24)
Chloride: 115 mmol/L — ABNORMAL HIGH (ref 101–111)
Chloride: 112 mmol/L — ABNORMAL HIGH (ref 101–111)
Creatinine, Ser: 0.94 mg/dL (ref 0.61–1.24)
GFR calc Af Amer: >60 mL/min (ref >60)
GFR calc Af Amer: >60 mL/min (ref >60)
GFR calc non Af Amer: >60 mL/min (ref >60)
GLUCOSE: 141 mg/dL — AB (ref 65–99)
Glucose, Bld: 150 mg/dL — ABNORMAL HIGH (ref 65–99)
POTASSIUM: 3 mmol/L — AB (ref 3.5–5.1)
POTASSIUM: 2.7 mmol/L — AB (ref 3.5–5.1)
SODIUM: 144 mmol/L (ref 135–145)
SODIUM: 139 mmol/L (ref 135–145)

## 2017-10-02 LAB — CBC
HCT: 22.9 % — ABNORMAL LOW (ref 39.0–52.0)
Hemoglobin: 7 g/dL — ABNORMAL LOW (ref 13.0–17.0)
MCH: 22.7 pg — ABNORMAL LOW (ref 26.0–34.0)
MCHC: 30.6 g/dL (ref 30.0–36.0)
MCV: 74.4 fL — ABNORMAL LOW (ref 78.0–100.0)
PLATELETS: 334 10*3/uL (ref 150–400)
RBC: 3.08 MIL/uL — ABNORMAL LOW (ref 4.22–5.81)
RDW: 23.1 % — AB (ref 11.5–15.5)
WBC: 16.8 10*3/uL — AB (ref 4.0–10.5)

## 2017-10-02 LAB — GLUCOSE, CAPILLARY
GLUCOSE-CAPILLARY: 149 mg/dL — AB (ref 65–99)
GLUCOSE-CAPILLARY: 130 mg/dL — AB (ref 65–99)
GLUCOSE-CAPILLARY: 177 mg/dL — AB (ref 65–99)
GLUCOSE-CAPILLARY: 201 mg/dL — AB (ref 65–99)
GLUCOSE-CAPILLARY: 125 mg/dL — AB (ref 65–99)
Glucose-Capillary: 129 mg/dL — ABNORMAL HIGH (ref 65–99)
Glucose-Capillary: 174 mg/dL — ABNORMAL HIGH (ref 65–99)
Glucose-Capillary: 179 mg/dL — ABNORMAL HIGH (ref 65–99)
Glucose-Capillary: 184 mg/dL — ABNORMAL HIGH (ref 65–99)
Glucose-Capillary: 186 mg/dL — ABNORMAL HIGH (ref 65–99)
Glucose-Capillary: 70 mg/dL (ref 65–99)

## 2017-10-02 LAB — MAGNESIUM
MAGNESIUM: 1.7 mg/dL (ref 1.7–2.4)
Magnesium: 1.9 mg/dL (ref 1.7–2.4)

## 2017-10-02 LAB — PHOSPHORUS: Phosphorus: 2.2 mg/dL — ABNORMAL LOW (ref 2.5–4.6)

## 2017-10-02 MED ORDER — INSULIN DETEMIR 100 UNIT/ML ~~LOC~~ SOLN: 20.0000 [IU] | Freq: Every day | SUBCUTANEOUS | Status: DC

## 2017-10-02 MED ORDER — OXYCODONE HCL 5 MG PO TABS
10.0000 mg | ORAL_TABLET | ORAL | Status: DC | PRN
  Administered 2017-10-02 – 2017-10-14 (×23): 10 mg via ORAL
  Filled 2017-10-02 (×24): qty 2

## 2017-10-02 MED ORDER — ATORVASTATIN CALCIUM 40 MG PO TABS
80.0000 mg | ORAL_TABLET | Freq: Every day | ORAL | Status: DC
  Administered 2017-10-02 – 2017-10-14 (×13): 80 mg via ORAL
  Filled 2017-10-02 (×14): qty 2

## 2017-10-02 MED ORDER — ACETAMINOPHEN 325 MG PO TABS
650.0000 mg | ORAL_TABLET | ORAL | Status: DC | PRN
  Administered 2017-10-02: 650 mg via ORAL
  Filled 2017-10-02 (×2): qty 2

## 2017-10-02 MED ORDER — HYDROCORTISONE NA SUCCINATE PF 100 MG IJ SOLR
50.0000 mg | Freq: Two times a day (BID) | INTRAMUSCULAR | Status: DC
  Administered 2017-10-02 – 2017-10-06 (×9): 50 mg via INTRAVENOUS
  Filled 2017-10-02 (×9): qty 2

## 2017-10-02 MED ORDER — FERROUS SULFATE 325 (65 FE) MG PO TABS
325.0000 mg | ORAL_TABLET | Freq: Two times a day (BID) | ORAL | Status: DC
  Administered 2017-10-02 – 2017-10-14 (×24): 325 mg via ORAL
  Filled 2017-10-02 (×24): qty 1

## 2017-10-02 MED ORDER — INSULIN ASPART 100 UNIT/ML ~~LOC~~ SOLN
0.0000 [IU] | Freq: Three times a day (TID) | SUBCUTANEOUS | Status: DC
  Administered 2017-10-02 – 2017-10-03 (×3): 3 [IU] via SUBCUTANEOUS
  Administered 2017-10-04: 4 [IU] via SUBCUTANEOUS
  Administered 2017-10-04: 3 [IU] via SUBCUTANEOUS
  Administered 2017-10-05 – 2017-10-06 (×3): 4 [IU] via SUBCUTANEOUS
  Administered 2017-10-06: 3 [IU] via SUBCUTANEOUS
  Administered 2017-10-06: 4 [IU] via SUBCUTANEOUS
  Administered 2017-10-07 – 2017-10-08 (×4): 3 [IU] via SUBCUTANEOUS
  Administered 2017-10-09: 7 [IU] via SUBCUTANEOUS
  Administered 2017-10-09: 4 [IU] via SUBCUTANEOUS
  Administered 2017-10-09: 3 [IU] via SUBCUTANEOUS
  Administered 2017-10-10: 7 [IU] via SUBCUTANEOUS
  Administered 2017-10-10 (×2): 4 [IU] via SUBCUTANEOUS
  Administered 2017-10-11: 11 [IU] via SUBCUTANEOUS
  Administered 2017-10-11: 7 [IU] via SUBCUTANEOUS
  Administered 2017-10-11: 3 [IU] via SUBCUTANEOUS
  Administered 2017-10-12: 7 [IU] via SUBCUTANEOUS
  Administered 2017-10-12 – 2017-10-13 (×3): 3 [IU] via SUBCUTANEOUS
  Administered 2017-10-13: 4 [IU] via SUBCUTANEOUS
  Administered 2017-10-14: 7 [IU] via SUBCUTANEOUS
  Administered 2017-10-14: 4 [IU] via SUBCUTANEOUS

## 2017-10-02 MED ORDER — POTASSIUM CHLORIDE CRYS ER 20 MEQ PO TBCR
40.0000 meq | EXTENDED_RELEASE_TABLET | ORAL | Status: AC
  Administered 2017-10-02 (×3): 40 meq via ORAL
  Filled 2017-10-02 (×2): qty 2

## 2017-10-02 MED ORDER — HYDROMORPHONE HCL 1 MG/ML IJ SOLN
1.0000 mg | INTRAMUSCULAR | Status: DC | PRN
  Administered 2017-10-02 – 2017-10-06 (×22): 1 mg via INTRAVENOUS
  Filled 2017-10-02 (×22): qty 1

## 2017-10-02 MED ORDER — FAMOTIDINE 20 MG PO TABS
20.0000 mg | ORAL_TABLET | Freq: Every day | ORAL | Status: DC
  Administered 2017-10-02 – 2017-10-14 (×13): 20 mg via ORAL
  Filled 2017-10-02 (×13): qty 1

## 2017-10-02 MED ORDER — GABAPENTIN 300 MG PO CAPS
600.0000 mg | ORAL_CAPSULE | Freq: Two times a day (BID) | ORAL | Status: DC
  Administered 2017-10-02 – 2017-10-14 (×26): 600 mg via ORAL
  Filled 2017-10-02 (×26): qty 2

## 2017-10-02 MED ORDER — VITAMIN C 500 MG PO TABS
250.0000 mg | ORAL_TABLET | Freq: Two times a day (BID) | ORAL | Status: DC
  Administered 2017-10-02 – 2017-10-14 (×26): 250 mg via ORAL
  Filled 2017-10-02 (×26): qty 1

## 2017-10-02 MED ORDER — ASPIRIN EC 81 MG PO TBEC
81.0000 mg | DELAYED_RELEASE_TABLET | Freq: Every day | ORAL | Status: DC
  Administered 2017-10-02 – 2017-10-14 (×13): 81 mg via ORAL
  Filled 2017-10-02 (×13): qty 1

## 2017-10-02 MED ORDER — OXYCODONE HCL 20 MG PO TABS: 20.0000 mg | ORAL_TABLET | ORAL | Status: DC | PRN

## 2017-10-02 MED ORDER — POTASSIUM PHOSPHATES 15 MMOLE/5ML IV SOLN
20.0000 mmol | Freq: Once | INTRAVENOUS | Status: AC
  Administered 2017-10-02: 20 mmol via INTRAVENOUS
  Filled 2017-10-02: qty 6.67

## 2017-10-02 MED ORDER — INSULIN DETEMIR 100 UNIT/ML ~~LOC~~ SOLN
30.0000 [IU] | Freq: Every day | SUBCUTANEOUS | Status: DC
  Administered 2017-10-02 – 2017-10-07 (×5): 30 [IU] via SUBCUTANEOUS
  Filled 2017-10-02 (×7): qty 0.3

## 2017-10-02 MED ORDER — INSULIN ASPART 100 UNIT/ML ~~LOC~~ SOLN
0.0000 [IU] | Freq: Every day | SUBCUTANEOUS | Status: DC
  Administered 2017-10-04 – 2017-10-11 (×3): 2 [IU] via SUBCUTANEOUS

## 2017-10-02 MED ORDER — MAGNESIUM SULFATE 2 GM/50ML IV SOLN
2.0000 g | Freq: Once | INTRAVENOUS | Status: AC
  Administered 2017-10-02: 2 g via INTRAVENOUS
  Filled 2017-10-02: qty 50

## 2017-10-02 NOTE — Progress Notes (Signed)
Spoke to patient and RN at bedside regarding CPAP usage and patient states that he will not and can not tolerate machine.  States that he tried before at his home and was not able to sleep with device on.  Educated patient that if he started to desaturate or have distress during night then we will have to try to wear the mask to avoid other invasive measures.

## 2017-10-02 NOTE — Progress Notes (Addendum)
Pt having more frequent multifocal PVCs on monitor. Pt had 2 6 beat runs of wide QRS complex. Pt nonsymptomatic.  Sood MD notified. Orders received to check BMET and Mag levels. Will continue to monitor.

## 2017-10-02 NOTE — Progress Notes (Signed)
Pt extubated per doctor. Extubated to a 4LNC. Leak test pass,,suction the pt, pt able to follow simple directions. Pt was able to speak and had a strong cough after extubation. RT will continue to monitor

## 2017-10-02 NOTE — Procedures (Signed)
Extubation Procedure Note  Patient Details:   Name: [name] DOB: 1951/03/03 MRN: [id number]   Airway Documentation:  Airway 7.5 mm (Active)  Secured at (cm) 25 cm 10/02/2017  3:17 AM  Measured From Lips 10/02/2017  3:17 AM  Secured Location Center 10/02/2017  3:17 AM  Secured By [employer] 10/02/2017  3:17 AM  Tube Holder Repositioned Yes 10/02/2017  3:17 AM  Cuff Pressure (cm H2O) 28 cm H2O 10/02/2017  3:17 AM  Site Condition Dry 10/02/2017  3:17 AM    Evaluation  O2 sats: stable throughout Complications: No apparent complications Patient did tolerate procedure well. Bilateral Breath Sounds: Rhonchi   Yes  Katheren Shams 10/02/2017, 8:12 AM

## 2017-10-02 NOTE — Progress Notes (Signed)
CRITICAL VALUE ALERT  Critical Value:  Potassium 2.7  Date & Time Notied: 10/02/2017 1646  Provider Notified: Craige Cotta, MD  Orders Received/Actions taken: PO Potassium and Iv magnesium ordered and given.

## 2017-10-02 NOTE — Progress Notes (Signed)
CSW following for discharge needs. Patient is a recent readmit; per chart review, patient is long term resident at [facility] (see most recent assessment, completed on 09/02/17).   CSW will continue to follow to facilitate return to long term care when medically stable.  Blenda Nicely, [place] Clinical Social Worker [phone] (weekend coverage)      Clinical Social Work Assessment  Patient Details  Name: [name] MRN: [id number] Date of Birth: Mar 20, 1951  Date of referral:  09/02/17               Reason for consult:  Discharge Planning                           Permission sought to share information with:  Facility Medical sales representative, Family Supports Permission granted to share information::  Yes, Verbal Permission Granted             Name::     Christine Schiefelbein             Agency::  [facility]             Relationship::  Sister             Contact Information:  [phone]  Housing/Transportation Living arrangements for the past 2 months:  Skilled Nursing Facility Source of Information:  Medical Team, Siblings Patient Interpreter Needed:  None Criminal Activity/Legal Involvement Pertinent to Current Situation/Hospitalization:  No - Comment as needed Significant Relationships:  Siblings Lives with:  Facility Resident Do you feel safe going back to the place where you live?  Yes Need for family participation in patient care:  Yes (Comment)  Care giving concerns:  Patient is a long-term care resident at [facility].   Social Worker assessment / plan:  CSW received call back from patient's sister. CSW introduced role and explained that discharge planning would be discussed. Patient's sister confirmed is a long-term care resident at [facility] and the plan is for him to return once stable for discharge. No further concerns. CSW encouraged patient's sister to contact CSW as needed. CSW will continue to follow patient  and his sister for support and facilitate discharge back to SNF once medically stable.  Employment status:  Retired Database administrator PT Recommendations:  Not assessed at this time Information / Referral to community resources:  Skilled Nursing Facility  Patient/Family's Response to care:  Patient not oriented. Patient's sister agreeable to return to SNF. Patient's sister supportive and involved in patient's care. Patient's sister appreciated social work intervention.  Patient/Family's Understanding of and Emotional Response to Diagnosis, Current Treatment, and Prognosis:  Patient not oriented. Patient's sister has a good understanding of the reason for admission and need to return to SNF once medically stable for discharge. Patient's sister appears happy with hospital care.  Emotional Assessment Appearance:  Appears stated age Attitude/Demeanor/Rapport:  Unable to Assess Affect (typically observed):  Unable to Assess Orientation:    Alcohol / Substance use:  Never Used Psych involvement (Current and /or in the community):  No (Comment)  Discharge Needs  Concerns to be addressed:  Care Coordination Readmission within the last 30 days:  No Current discharge risk:  Cognitively Impaired Barriers to Discharge:  Continued Medical Work up   Margarito Liner, LCSW 09/02/2017, 3:53 PM

## 2017-10-02 NOTE — Progress Notes (Signed)
PULMONARY / CRITICAL CARE MEDICINE   Name: [name] MRN: [id number] DOB: 1951-03-07    ADMISSION DATE:  09/29/2017  REFERRING MD:  Dr. Preston Fleeting, ER  CHIEF COMPLAINT:  Vomiting  HISTORY OF PRESENT ILLNESS:   67 yo male former smoker from [facility] with vomiting, altered mental status, hypoxia.  Intubated in ER and started on pressors for septic shock with concern for aspiration pneumonia.  Found to have severe hypokalemia.  Found to also have C diff.  PMHx of CAD, diastolic CHF, CKD 3, DM, HLD, HTN, OSA, Neuropathy, GI bleeding, A fib, PAD s/p Rt AKA, ESBL E coli bacteremia.  SUBJECTIVE:  Off pressors.  Did well with SBT.  VITAL SIGNS: BP (!) 112/58   Pulse (!) 107   Temp 99.3 F (37.4 C)   Resp 18   Ht 6' (1.829 m) Comment: per pt  Wt 257 lb 0.9 oz (116.6 kg)   SpO2 100%   BMI 34.86 kg/m   VENTILATOR SETTINGS: Vent Mode: PRVC FiO2 (%):  [40 %] 40 % Set Rate:  [18 bmp] 18 bmp Vt Set:  [620 mL] 620 mL PEEP:  [5 cmH20] 5 cmH20 Pressure Support:  [10 cmH20] 10 cmH20 Plateau Pressure:  [17 cmH20-23 cmH20] 17 cmH20  INTAKE / OUTPUT: I/O last 3 completed shifts: In: 4320.4 [I.V.:2778.4; NG/GT:535.3; IV Piggyback:1006.7] Out: 3495 [Urine:2570; Emesis/NG output:275; Stool:650]  PHYSICAL EXAMINATION:  General - alert Eyes - pupils reactive ENT - ETT in place Cardiac - regular, no murmur Chest - no wheeze, rales Abd - soft, non tender Ext - Rt leg stump clean Skin - sacral wound Neuro - follows commands   LABS:  BMET Recent Labs  Lab 10/01/17 0516 10/01/17 1657 10/02/17 0559  NA 139 140 144  K 2.7* 2.9* 3.0*  CL 112* 113* 115*  CO2 17* 17* 19*  BUN 29* 27* 27*  CREATININE 0.99 1.06 0.94  GLUCOSE 185* 197* 150*    Electrolytes Recent Labs  Lab 10/01/17 0516 10/01/17 1003 10/01/17 1657 10/02/17 0559  CALCIUM 6.5*  --  6.7* 6.9*  MG 2.0 2.0 1.9 1.9  PHOS 2.7 2.2* 1.8* 2.2*    CBC Recent Labs  Lab 09/30/17 0342 10/01/17 0516  10/02/17 0559  WBC 31.6* 16.7* 16.8*  HGB 8.6* 7.1* 7.0*  HCT 27.4* 23.1* 22.9*  PLT 636* 376 334    Coag's Recent Labs  Lab 09/29/17 0553  INR 3.04    Sepsis Markers Recent Labs  Lab 09/29/17 0458 09/29/17 0553 09/29/17 0848 09/30/17 0342 10/01/17 0516  LATICACIDVEN 4.47* 3.7* 3.1*  --   --   PROCALCITON  --  5.95  --  20.14 7.27    ABG Recent Labs  Lab 09/29/17 0531 09/30/17 0359  PHART 7.381 7.375  PCO2ART 35.9 30.4*  PO2ART 57.7* 143*    Liver Enzymes Recent Labs  Lab 09/29/17 0441 10/01/17 0516  AST 29 21  ALT 16* 13*  ALKPHOS 339* 198*  BILITOT 0.5 0.7  ALBUMIN 1.8* 1.4*    Cardiac Enzymes Recent Labs  Lab 09/29/17 0553  TROPONINI 0.04*    Glucose Recent Labs  Lab 10/02/17 0040 10/02/17 0143 10/02/17 0252 10/02/17 0352 10/02/17 0458 10/02/17 0603  GLUCAP 184* 186* 174* 149* 130* 129*    Imaging Dg Chest Port 1 View  Result Date: 10/02/2017 CLINICAL DATA:  Respiratory failure.  Ex-smoker. EXAM: PORTABLE CHEST 1 VIEW COMPARISON:  10/01/2017. FINDINGS: Endotracheal tube in satisfactory position. Nasogastric tube extending into the stomach. Stable enlarged cardiac  silhouette. Mild increase in dense airspace opacity at the left lung base. Increased patchy opacity in the right lower lung zone. Stable prominence of the interstitial markings. Thoracic spine degenerative changes. IMPRESSION: 1. Mild further increase in dense left lower lobe atelectasis or pneumonia. 2. Increased probable pneumonia in the right lower lung zone. 3. Stable cardiomegaly and chronic interstitial lung disease. Electronically Signed   By: Beckie Salts M.D.   On: 10/02/2017 07:16     STUDIES:  Renal u/s 3/21 >> normal kidneys Echo 3/22 >> mild LVH, EF 55 to 60%  CULTURES: Blood 3/21 >> Urine 3/21 >> Enterococcus, Acinetobacter >>  C diff PCR 3/22 >> Ag positive, toxin negative, PCR positive  ANTIBIOTICS: Vancomycin 3/21 >> 3/22 Meropenem 3/21 >>  Enteral  vancomycin 3/23 >>    SIGNIFICANT EVENTS: 3/21 Admit 3/24 Off pressors, extubated  LINES/TUBES: ETT 3/21 >> 3/24 Rt femoral CVL 3/21 >>   DISCUSSION: Off pressors.  Doing better with SBT.  ASSESSMENT / PLAN:  Septic shock >> resolved. Aspiration pneumonia. UTI. Unstageable sacral wound >> present prior to admission. Relative adrenal insufficiency. C diff colitis. - continue IV fluids - day 4/7 of meropenem for PNA and UTI - day 2 of enteral vancomycin for C diff - wound care - wean off solu cortef - place peripheral IV and d/c central line  Acute respiratory failure. Hx of OSA. - extubate 3/24 - f/u CXR intermittently - CPAP qhs - Oxygen to keep SpO2 > 92% - prn BDs  Bradycardia from hypokalemia >> resolved. Hx of CAD, chronic diastolic CHF, A fib, HTN. - using lovenox in place of xarelto for now >> can resume xarelto when able to swallow pills - hold outpt lipitor, ASA, cardizem, toprol  Acute renal failure >> baseline creatinine 0.96 > resolved. Hypokalemia, hypomagnesemia, hypophosphatemia. Lactic acidosis >> resolved. - f/u BMET - replace as needed  Anemia of critical illness, chronic disease, and iron deficiency. - f/u CBC - transfuse for Hb < 7 - add ferrous sulfate and vitamin C  Nausea and vomiting >> resolved. Severe protein calorie malnutrition. - advance diet after extubation - prn zofran  Acute metabolic encephalopathy >> resolved. - monitor mental status - hold outpt neurontin, oxycodone  DM type II and steroid induced hyperglycemia. - SSI with levemir  DVT prophylaxis - lovenox SUP - pepcid Nutrition - advance to carb modified after extubation Goals of care - full code; reported to have out of hospital DNR form >> will need to clarify with his family.  CC time 32 minutes  Coralyn Helling, MD [facility] Pulmonary/Critical Care 10/02/2017, 8:07 AM Pager:  [phone] After 3pm call: [phone]

## 2017-10-02 NOTE — Progress Notes (Signed)
Pharmacy Antibiotic Note  TREV BOLEY is a 67 y.o. male admitted on 09/29/2017 with septic shock, VDRF.  Pharmacy has been consulted for meropenem and vancomycin dosing. aspiration PNA- B infiltrates c/w witnessed aspiration event, UTI, recent ESBL E coli bacteremia;  PTA he was on Merrem 1 gm q8 for ESBL E coli bacteremia - completed course 09/16/17  10/02/2017 Day 4/7 Merrem. Day 2/10 PO vanc for c diff.   Ucx 70 K VRE, 30 K acinetobacter - d/w Dr. Craige Cotta - no Rx for VRE for now  Plan: Meropenem 1 gm IV q8 PO vanc for c diff. F/u renal function, temp, WBC, culture data  Height: 6' (182.9 cm)(per pt) Weight: 257 lb 0.9 oz (116.6 kg) IBW/kg (Calculated) : 77.6  Temp (24hrs), Avg:99.9 F (37.7 C), Min:99 F (37.2 C), Max:100.9 F (38.3 C)  Recent Labs  Lab 09/29/17 0441 09/29/17 0458 09/29/17 0553 09/29/17 0848  09/29/17 1747 09/30/17 0342 10/01/17 0516 10/01/17 1657 10/02/17 0559  WBC 39.4*  --   --   --   --   --  31.6* 16.7*  --  16.8*  CREATININE 2.03*  --   --   --    < > 1.60* 1.44* 0.99 1.06 0.94  LATICACIDVEN  --  4.47* 3.7* 3.1*  --   --   --   --   --   --    < > = values in this interval not displayed.    Estimated Creatinine Clearance: 103.3 mL/min (by C-G formula based on SCr of 0.94 mg/dL).    No Known Allergies   Antimicrobials this admission: 3/21 meropenem >>  3/21 vancomycin >> 3/22 3/22 Vanc po >> (4/1)  Dose adjustments this admission: 3/21 vanc 1 Gm IV q24h for AUC = 452 (using SCr 2.03)  3/21 vanc 1250 q24 for AUC 475 (SCr 1.7)   Microbiology results: 3/21 Ucx>> 70 K enterococcus faecalis VRE, 30 K acinetobacter calcoaceticus/baumanni comples 3/21 BCx>>1 of 2 BCID GVR anaerobic bottle only, other BCx ngtd 3/21 MRSA PCR neg 3/21 sputum ordered> 3/22 C diff Ag + tox neg, PCR +   Thank you for allowing pharmacy to be a part of this patient's care.  Herby Abraham, Pharm.D. [phone] 10/02/2017 10:09 AM

## 2017-10-03 DIAGNOSIS — J189 Pneumonia, unspecified organism: Secondary | ICD-10-CM

## 2017-10-03 DIAGNOSIS — A0472 Enterocolitis due to Clostridium difficile, not specified as recurrent: Secondary | ICD-10-CM

## 2017-10-03 LAB — URINE CULTURE

## 2017-10-03 LAB — GLUCOSE, CAPILLARY
GLUCOSE-CAPILLARY: 104 mg/dL — AB (ref 65–99)
Glucose-Capillary: 123 mg/dL — ABNORMAL HIGH (ref 65–99)
Glucose-Capillary: 118 mg/dL — ABNORMAL HIGH (ref 65–99)
Glucose-Capillary: 149 mg/dL — ABNORMAL HIGH (ref 65–99)
Glucose-Capillary: 99 mg/dL (ref 65–99)

## 2017-10-03 LAB — BASIC METABOLIC PANEL
Anion gap: 8 (ref 5–15)
BUN: 22 mg/dL — ABNORMAL HIGH (ref 6–20)
CHLORIDE: 114 mmol/L — AB (ref 101–111)
CO2: 19 mmol/L — AB (ref 22–32)
CREATININE: 0.81 mg/dL (ref 0.61–1.24)
Calcium: 6.9 mg/dL — ABNORMAL LOW (ref 8.9–10.3)
GFR calc non Af Amer: >60 mL/min (ref >60)
GLUCOSE: 116 mg/dL — AB (ref 65–99)
Potassium: 3.4 mmol/L — ABNORMAL LOW (ref 3.5–5.1)
Sodium: 141 mmol/L (ref 135–145)

## 2017-10-03 LAB — CBC
HCT: 25.3 % — ABNORMAL LOW (ref 39.0–52.0)
HEMOGLOBIN: 7.7 g/dL — AB (ref 13.0–17.0)
MCH: 23 pg — AB (ref 26.0–34.0)
MCHC: 30.4 g/dL (ref 30.0–36.0)
MCV: 75.5 fL — AB (ref 78.0–100.0)
PLATELETS: 292 10*3/uL (ref 150–400)
RBC: 3.35 MIL/uL — AB (ref 4.22–5.81)
RDW: 23.1 % — ABNORMAL HIGH (ref 11.5–15.5)
WBC: 13.5 10*3/uL — ABNORMAL HIGH (ref 4.0–10.5)

## 2017-10-03 LAB — MAGNESIUM: Magnesium: 1.9 mg/dL (ref 1.7–2.4)

## 2017-10-03 LAB — PHOSPHORUS: Phosphorus: 2.4 mg/dL — ABNORMAL LOW (ref 2.5–4.6)

## 2017-10-03 MED ORDER — MAGNESIUM SULFATE 2 GM/50ML IV SOLN
2.0000 g | Freq: Once | INTRAVENOUS | Status: AC
  Administered 2017-10-03: 2 g via INTRAVENOUS
  Filled 2017-10-03: qty 50

## 2017-10-03 MED ORDER — POTASSIUM PHOSPHATES 15 MMOLE/5ML IV SOLN
30.0000 mmol | Freq: Once | INTRAVENOUS | Status: AC
  Administered 2017-10-03: 30 mmol via INTRAVENOUS
  Filled 2017-10-03: qty 10

## 2017-10-03 MED ORDER — SODIUM CHLORIDE 0.9 % IV SOLN
3.0000 g | Freq: Four times a day (QID) | INTRAVENOUS | Status: DC
  Administered 2017-10-03 – 2017-10-08 (×20): 3 g via INTRAVENOUS
  Filled 2017-10-03 (×21): qty 3

## 2017-10-03 MED ORDER — PREMIER PROTEIN SHAKE
11.0000 [oz_av] | Freq: Two times a day (BID) | ORAL | Status: DC
  Administered 2017-10-03 – 2017-10-08 (×4): 11 [oz_av] via ORAL
  Filled 2017-10-03 (×16): qty 325.31

## 2017-10-03 NOTE — Progress Notes (Signed)
Pharmacy Antibiotic Note  [name] is a 67 y.o. male admitted on 09/29/2017 with septic shock, VDRF.  Pharmacy has been consulted for meropenem and vancomycin dosing. aspiration PNA- B infiltrates c/w witnessed aspiration event, UTI, recent ESBL E coli bacteremia;  PTA he was on Merrem 1 gm q8 for ESBL E coli bacteremia - completed course 09/16/17  10/03/2017 Day 5/8 Merrem. Day 2/10 PO vanc for c diff.   Ucx 70 K VRE, 30 K acinetobacter   Plan:  - Discussed with PCCM re: UCx (? Colonization vs pathogen).  Recommend change to unasyn 3gm IV q6h to cover urinary organisms and for aspiration PNA - Cannot use amox/clav for acinetobacter (sulbactam is active component)  PO vanc for c diff. F/u renal function, temp, WBC, culture data  Height: 6' (182.9 cm)(per pt) Weight: 257 lb 0.9 oz (116.6 kg) IBW/kg (Calculated) : 77.6  Temp (24hrs), Avg:97.7 F (36.5 C), Min:97 F (36.1 C), Max:98.4 F (36.9 C)  Recent Labs  Lab 09/29/17 0441 09/29/17 0458 09/29/17 0553 09/29/17 0848  09/30/17 0342 10/01/17 0516 10/01/17 1657 10/02/17 0559 10/02/17 1555 10/03/17 0618  WBC 39.4*  --   --   --   --  31.6* 16.7*  --  16.8*  --  13.5*  CREATININE 2.03*  --   --   --    < > 1.44* 0.99 1.06 0.94 0.94 0.81  LATICACIDVEN  --  4.47* 3.7* 3.1*  --   --   --   --   --   --   --    < > = values in this interval not displayed.    Estimated Creatinine Clearance: 119.9 mL/min (by C-G formula based on SCr of 0.81 mg/dL).    No Known Allergies   Antimicrobials this admission: 3/21 meropenem >> 3/25 3/21 vancomycin >> 3/22 3/22 Vanc po >> (4/1)  Dose adjustments this admission: 3/21 vanc 1 Gm IV q24h for AUC = 452 (using SCr 2.03)  3/21 vanc 1250 q24 for AUC 475 (SCr 1.7)   Microbiology results: 3/21 Ucx>> 70 K enterococcus faecalis VRE, 30 K acinetobacter calcoaceticus/baumanni comples 3/21 BCx>>1 of 2 BCID GVR anaerobic bottle only, other BCx ngtd 3/21 MRSA PCR neg 3/21 sputum  ordered> 3/22 C diff Ag + tox neg, PCR +   Thank you for allowing pharmacy to be a part of this patient's care.  Juliette Alcide, PharmD, BCPS.   Pager: [phone] 10/03/2017 2:31 PM

## 2017-10-03 NOTE — Progress Notes (Signed)
Nutrition Follow-up  DOCUMENTATION CODES:   Obesity unspecified  INTERVENTION:  - Will order Premier Protein BID, each supplement provides 160 kcal, 5 grams of carb, and 30 grams of protein.  - Continue to encourage PO intakes. - RD will continue to monitor for additional nutrition-related needs.   Monitor magnesium, potassium, and phosphorus daily for at least 3 days, MD to replete as needed, as pt is at risk for refeeding syndrome given current slight hypokalemia and hypophosphatemia (better today after repletion 3/24 and today).   NUTRITION DIAGNOSIS:   Increased nutrient needs related to acute illness, wound healing as evidenced by estimated needs. -  GOAL:   Patient will meet greater than or equal to 90% of their needs   MONITOR:   PO intake, Supplement acceptance, Weight trends, Labs, Skin  ASSESSMENT:   67 yo male with PMH of DM, morbid obesity, Afib, OSA, anemia, HTN, HLD, GIB, CHF, CAD, R-AKA, and recent ESBL Ecoli Bacteremia. He presented to the ED from nursing home. EMS was called for N/V and when they arrived they found patient hypoxic and minimally responsive. He was emergently intubated in the ED and found to be in septic shock requiring vasopressors.  Pt was extubated and OGT removed yesterday AM. Estimated nutrition needs updated at this time. Weight has been stable since admission. Diet advanced from NPO to Carb Modified yesterday 12:20 PM.   Per Dr. Percival Spanish note this afternoon: septic shock now resolved, aspiration PNA, C. Diff colitis positive, N/V now resolved.  Medications reviewed; 20 mg oral Pepcid/day, 325 mg ferrous sulfate BID, 50 mg Solu-cortef BID, sliding scale Novolog, 30 units Levemir/day, 2 g IV Mg sulfate x1 run yesterday and x1 run today, 40 mEq oral KCl x1 dose yesterday, 20 mmol IV KPhos x1 run yesterday, 30 mmol IV KPhos x1 run today, 250 mg ascorbic acid BID. Labs reviewed; CBGs: 118 and 149 mg/dL today, K: 3.4 mmol/L, BUN: 22 mg/dL, Phos: 2.4  mg/dL, Ca: 6.9 mg/dL.  IVF: NS @ 30 mL/hr.     Diet Order:  Diet Carb Modified Fluid consistency: Thin; Room service appropriate? Yes  EDUCATION NEEDS:   No education needs have been identified at this time  Skin:  Skin Assessment: Skin Integrity Issues: Skin Integrity Issues:: Unstageable Unstageable: full thickness sacrum Incisions: R AKA done on 09/01/17 (area healed)  Last BM:  3/25  Height:   Ht Readings from Last 1 Encounters:  09/29/17 6' (1.829 m)    Weight:   Wt Readings from Last 1 Encounters:  10/03/17 257 lb 0.9 oz (116.6 kg)    Ideal Body Weight:  80.91 kg  BMI:  Body mass index is 34.86 kg/m.  Estimated Nutritional Needs:   Kcal:  2100-2330 (18-20 kcal/kg)  Protein:  130-140 grams  Fluid:  >/= 2.1 L/day      Trenton Gammon, MS, RD, LDN, [facility] Inpatient Clinical Dietitian Pager # [phone] After hours/weekend pager # [phone]

## 2017-10-03 NOTE — Progress Notes (Signed)
PULMONARY / CRITICAL CARE MEDICINE   Name: [name] MRN: [id number] DOB: [birth year]    ADMISSION DATE:  09/29/2017  REFERRING MD:  Dr. Preston Fleeting, ER  CHIEF COMPLAINT:  Vomiting  HISTORY OF PRESENT ILLNESS:   67 yo male former smoker from [facility] with vomiting, altered mental status, hypoxia.  Intubated in ER and started on pressors for septic shock with concern for aspiration pneumonia.  Found to have severe hypokalemia.  Found to also have C diff.  PMHx of CAD, diastolic CHF, CKD 3, DM, HLD, HTN, OSA, Neuropathy, GI bleeding, A fib, PAD s/p Rt AKA, ESBL E coli bacteremia.  SUBJECTIVE:  Off pressors.  Did well with SBT 3/24 and was extubated. No further events overnight  VITAL SIGNS: BP 114/64   Pulse 98   Temp 98.1 F (36.7 C)   Resp 15   Ht 6' (1.829 m) Comment: per pt  Wt 257 lb 0.9 oz (116.6 kg)   SpO2 97%   BMI 34.86 kg/m    VENTILATOR SETTINGS:    INTAKE / OUTPUT: I/O last 3 completed shifts: In: 5441.1 [P.O.:2430; I.V.:1327.8; NG/GT:320; IV Piggyback:1363.3] Out: 6220 [Urine:5370; Stool:850]  PHYSICAL EXAMINATION:  General - Chronically ill appearing male, some painful distress off vent Eyes - PERRL, EOM-I and MMM ENT - -thyromegaly and -LAN Cardiac - RRR, Nl S1/S2 and -M/R/G Chest - Bibasilar crackles Abd - Soft, diffusely tender, ND and +BS Ext - Rt leg stump clean Skin - sacral wound Neuro - awake and interactive, follows commands  LABS:  BMET Recent Labs  Lab 10/02/17 0559 10/02/17 1555 10/03/17 0618  NA 144 139 141  K 3.0* 2.7* 3.4*  CL 115* 112* 114*  CO2 19* 19* 19*  BUN 27* 26* 22*  CREATININE 0.94 0.94 0.81  GLUCOSE 150* 141* 116*   Electrolytes Recent Labs  Lab 10/01/17 1657 10/02/17 0559 10/02/17 1555 10/03/17 0618  CALCIUM 6.7* 6.9* 6.8* 6.9*  MG 1.9 1.9 1.7 1.9  PHOS 1.8* 2.2*  --  2.4*   CBC Recent Labs  Lab 10/01/17 0516 10/02/17 0559 10/03/17 0618  WBC 16.7* 16.8* 13.5*  HGB 7.1* 7.0* 7.7*  HCT  23.1* 22.9* 25.3*  PLT 376 334 292   Coag's Recent Labs  Lab 09/29/17 0553  INR 3.04   Sepsis Markers Recent Labs  Lab 09/29/17 0458 09/29/17 0553 09/29/17 0848 09/30/17 0342 10/01/17 0516  LATICACIDVEN 4.47* 3.7* 3.1*  --   --   PROCALCITON  --  5.95  --  20.14 7.27   ABG Recent Labs  Lab 09/29/17 0531 09/30/17 0359  PHART 7.381 7.375  PCO2ART 35.9 30.4*  PO2ART 57.7* 143*   Liver Enzymes Recent Labs  Lab 09/29/17 0441 10/01/17 0516  AST 29 21  ALT 16* 13*  ALKPHOS 339* 198*  BILITOT 0.5 0.7  ALBUMIN 1.8* 1.4*   Cardiac Enzymes Recent Labs  Lab 09/29/17 0553  TROPONINI 0.04*   Glucose Recent Labs  Lab 10/02/17 0932 10/02/17 1140 10/02/17 1630 10/02/17 2212 10/03/17 0723 10/03/17 1118  GLUCAP 179* 125* 123* 70 118* 149*   Imaging I reviewed CXR myself, bibasilar opacifications noted  STUDIES:  Renal u/s 3/21 >> normal kidneys Echo 3/22 >> mild LVH, EF 55 to 60%  CULTURES: Blood 3/21 >> Urine 3/21 >> Enterococcus, Acinetobacter >>  C diff PCR 3/22 >> Ag positive, toxin negative, PCR positive  ANTIBIOTICS: Vancomycin 3/21 >> 3/22 Meropenem 3/21 >>  Enteral vancomycin 3/23 >>    SIGNIFICANT EVENTS: 3/21 Admit  3/24 Off pressors, extubated  LINES/TUBES: ETT 3/21 >> 3/24 Rt femoral CVL 3/21 >>   DISCUSSION: 67 year old male with multiple medical problems presenting to PCCM with c diff induced septic shock that is now off pressors and starting to make urine.  Discussed with TRH-MD and PCCM-NP.    ASSESSMENT / PLAN:  Septic shock >> resolved. Aspiration pneumonia. UTI. Unstageable sacral wound >> present prior to admission. Relative adrenal insufficiency. C diff colitis. - Continue IV fluids - Day 5/8 of meropenem for PNA and UTI - Day 3/21 of enteral vancomycin for C diff - Wound care - Wean off solu cortef  Acute respiratory failure. Hx of OSA. - Extubated 3/24 - Monitor closely for airway protection - F/u CXR  intermittently - CPAP qhs - Oxygen to keep SpO2 > 92% - PRN BDs  Bradycardia from hypokalemia >> resolved. Hx of CAD, chronic diastolic CHF, A fib, HTN. - Using lovenox in place of xarelto for now >> can resume xarelto when able to swallow pills - Hold outpt lipitor, ASA, cardizem, toprol  Acute renal failure >> baseline creatinine 0.96 > resolved. Hypokalemia, hypomagnesemia, hypophosphatemia. Lactic acidosis >> resolved. - F/u BMET - Replace as needed (Mg, Phos and K)  Anemia of critical illness, chronic disease, and iron deficiency. - F/u CBC - Transfuse for Hb < 7 - Add ferrous sulfate and vitamin C  Nausea and vomiting >> resolved. Severe protein calorie malnutrition. - Advance diet after extubation - PRN zofran  Acute metabolic encephalopathy >> resolved. - Monitor mental status - Monitor for airway protection - Hold outpt neurontin, oxycodone  DM type II and steroid induced hyperglycemia. - SSI with levemir  Transfer to SDU and to [facility] with PCCM off 3/26.  Alyson Reedy, M.D. [facility] Pulmonary/Critical Care Medicine. Pager: [phone]. After hours pager: [phone].

## 2017-10-03 NOTE — Progress Notes (Addendum)
Pt refused CPAP qhs.  Pt states that he no longer wears it at home like he is prescribed and he does not want to wear it while in hospital. Pt encouraged to contact RT should he change his mind.

## 2017-10-03 NOTE — Progress Notes (Signed)
ANTICOAGULATION CONSULT NOTE - Follow Up Consult  Pharmacy Consult for LMWH Indication: atrial fibrillation- bridge therapy while NPO and unable to take Xarelto  No Known Allergies  Patient Measurements: Height: 6' (182.9 cm)(per pt) Weight: 257 lb 0.9 oz (116.6 kg) IBW/kg (Calculated) : 77.6   Vital Signs: Temp: 97.3 F (36.3 C) (03/25 0500) BP: 86/44 (03/25 0500) Pulse Rate: 104 (03/25 0500)  Labs: Recent Labs    10/01/17 0516  10/02/17 0559 10/02/17 1555 10/03/17 0618  HGB 7.1*  --  7.0*  --  7.7*  HCT 23.1*  --  22.9*  --  25.3*  PLT 376  --  334  --  292  CREATININE 0.99   < > 0.94 0.94 0.81   < > = values in this interval not displayed.    Estimated Creatinine Clearance: 119.9 mL/min (by C-G formula based on SCr of 0.81 mg/dL).   Medical History: Past Medical History:  Diagnosis Date  . Arthritis    "hands and lower back" (09/19/2014)  . CAD (coronary artery disease)    a. s/p PCI to RCA in 2012. b. prior cath in 01/2014 with elevated L/RH pressures, mild-mod CAD of LAD/LCx with patent RCA, normal EF, c/b CIN/CHF.  [name] Carpal tunnel syndrome, bilateral   . Cellulitis   . Chronic diastolic CHF (congestive heart failure) (HCC)   . Chronic kidney disease (CKD), stage III (moderate) (HCC)   . Chronic lower back pain   . Diabetes (HCC)   . DKA (diabetic ketoacidoses) (HCC) 03/2017  . History of blood transfusion ~ 1954   "related to OR"  . Hyperlipidemia   . Hypertension   . Hypoxia    a. Qualified for home O2 at DC in 09/2014.  [name] Lower GI bleed   . Microcytic anemia   . Morbid obesity (HCC)   . Neuropathy   . OSA (obstructive sleep apnea)    "I wear nasal prongs; haven't been using prongs recently" (09/19/2014)  . PAF (paroxysmal atrial fibrillation) (HCC)    TEE DCCV 09/23/2014  . Physical deconditioning   . Pilonidal cyst 1980's; 01/25/2013  . Scrotal abscess   . Type II diabetes mellitus (HCC)     Medications:  Medications Prior to Admission   Medication Sig Dispense Refill Last Dose  . acetaminophen (TYLENOL) 500 MG tablet Take 500 mg by mouth every 6 (six) hours as needed for mild pain.    unknown  . aluminum-magnesium hydroxide-simethicone (MAALOX) 200-200-20 MG/5ML SUSP Take 30 mLs by mouth every 4 (four) hours as needed (heartburn).   09/22/2017 at 1900  . Amino Acids-Protein Hydrolys (FEEDING SUPPLEMENT, PRO-STAT SUGAR FREE 64,) LIQD Take 30 mLs by mouth 3 (three) times daily with meals. 900 mL 0 09/28/2017 at 2100  . aspirin EC 81 MG EC tablet Take 1 tablet (81 mg total) by mouth daily. 30 tablet 2 09/28/2017 at 0900  . atorvastatin (LIPITOR) 80 MG tablet Take 1 tablet (80 mg total) by mouth daily. 30 tablet 10 09/28/2017 at 2100  . calcium-vitamin D (OSCAL WITH D) 500-200 MG-UNIT tablet Take 1 tablet by mouth daily.   09/28/2017 at 0800  . collagenase (SANTYL) ointment Apply 1 application topically daily.   09/28/2017 at Unknown time  . diltiazem (CARDIZEM) 120 MG tablet Take 1 tablet (120 mg total) by mouth every 6 (six) hours. 120 tablet 0 09/28/2017 at 1800  . feeding supplement, GLUCERNA SHAKE, (GLUCERNA SHAKE) LIQD Take 237 mLs by mouth 3 (three) times daily between meals. 30 Can  0 09/28/2017 at 2100  . ferrous sulfate 325 (65 FE) MG tablet Take 1 tablet (325 mg total) by mouth 2 (two) times daily with a meal. 30 tablet 0 09/28/2017 at 1700  . gabapentin (NEURONTIN) 300 MG capsule Take 2 capsules (600 mg total) by mouth 2 (two) times daily. 60 capsule 3 09/28/2017 at 1700  . hydrocerin (EUCERIN) CREA Apply 1 application topically 3 (three) times daily. legs 454 g 3 09/28/2017 at 2100  . insulin aspart (NOVOLOG FLEXPEN) 100 UNIT/ML FlexPen Inject 2-10 Units into the skin 4 (four) times daily -  with meals and at bedtime. Per sliding scale.. 150-200=2 units 201-250=4 units 251-300=6 units 301-350=8 units 351-400=10 units Over 400 call MD   09/28/2017 at 2100  . insulin detemir (LEVEMIR) 100 UNIT/ML injection Inject 40 Units into the  skin daily.    09/28/2017 at 2000  . lidocaine (LMX) 4 % cream Apply topically daily as needed (during hydro). 30 g 0 unknown  . loperamide (IMODIUM) 2 MG capsule Take 1 capsule (2 mg total) by mouth every 6 (six) hours as needed for diarrhea or loose stools. 30 capsule 0 09/28/2017 at 1951  . metoprolol succinate (TOPROL-XL) 50 MG 24 hr tablet Take 50 mg by mouth daily. Take with or immediately following a meal.   09/28/2017 at 0900  . Multiple Vitamin (MULTIVITAMIN WITH MINERALS) TABS Take 1 tablet by mouth daily.   09/28/2017 at 0900  . ondansetron (ZOFRAN) 4 MG tablet Take 4 mg by mouth every 8 (eight) hours as needed for nausea or vomiting.   09/25/2017 at 1137  . Oxycodone HCl 20 MG TABS Take 20 mg by mouth every 4 (four) hours as needed (pain).   09/28/2017 at 2002  . potassium & sodium phosphates (PHOS-NAK) 280-160-250 MG PACK Take 1 packet by mouth 4 (four) times daily -  with meals and at bedtime. 120 packet 0 09/28/2017 at 2100  . potassium chloride SA (K-DUR,KLOR-CON) 20 MEQ tablet Take 40 mEq by mouth every 6 (six) hours. For 2 days   09/28/2017 at 1800  . XARELTO 20 MG TABS tablet TAKE 1 TABLET BY MOUTH EVERY DAY WITH SUPPER 30 tablet 0 09/28/2017 at 1700  . OXYGEN Inhale 2-3 L into the lungs continuous.   unk    Assessment: 67 yo M on Xarelto PTA for Afib, last dose 3/20 at 2100.  He was given LMWH 40 mg sq x 1 dose 3/21 at 1139 am.   Admit INR elevated at 3 due to Xarelto lab interference.    Today, 10/03/2017 ARF resolving, SCr normalized 0.81, CrCl > 100 ml/min Hgb 7.7 (relatively stable), Plts 292k   Goal of Therapy:  Anti-Xa level 0.6-1 units/ml 4hrs after LMWH dose given Monitor platelets by anticoagulation protocol: Yes   Plan:  Continue LMWH 1 mg/kg sq q12h = 120 sq q12h F/u creatine and CBC F/u resume Xarelto now that extubated  Loralee Pacas, PharmD, BCPS Pager: [phone] 10/03/2017 9:06 AM

## 2017-10-04 DIAGNOSIS — E162 Hypoglycemia, unspecified: Secondary | ICD-10-CM

## 2017-10-04 DIAGNOSIS — G9341 Metabolic encephalopathy: Secondary | ICD-10-CM

## 2017-10-04 LAB — BASIC METABOLIC PANEL
ANION GAP: 7 (ref 5–15)
BUN: 16 mg/dL (ref 6–20)
CHLORIDE: 113 mmol/L — AB (ref 101–111)
CO2: 23 mmol/L (ref 22–32)
Calcium: 7.1 mg/dL — ABNORMAL LOW (ref 8.9–10.3)
Creatinine, Ser: 0.76 mg/dL (ref 0.61–1.24)
GFR calc Af Amer: >60 mL/min (ref >60)
GFR calc non Af Amer: >60 mL/min (ref >60)
GLUCOSE: 64 mg/dL — AB (ref 65–99)
POTASSIUM: 3 mmol/L — AB (ref 3.5–5.1)
Sodium: 143 mmol/L (ref 135–145)

## 2017-10-04 LAB — GLUCOSE, CAPILLARY
GLUCOSE-CAPILLARY: 49 mg/dL — AB (ref 65–99)
GLUCOSE-CAPILLARY: 52 mg/dL — AB (ref 65–99)
GLUCOSE-CAPILLARY: 76 mg/dL (ref 65–99)
GLUCOSE-CAPILLARY: 192 mg/dL — AB (ref 65–99)
Glucose-Capillary: 63 mg/dL — ABNORMAL LOW (ref 65–99)
Glucose-Capillary: 133 mg/dL — ABNORMAL HIGH (ref 65–99)
Glucose-Capillary: 202 mg/dL — ABNORMAL HIGH (ref 65–99)

## 2017-10-04 LAB — CBC
HEMATOCRIT: 27.1 % — AB (ref 39.0–52.0)
HEMOGLOBIN: 8.2 g/dL — AB (ref 13.0–17.0)
MCH: 22.8 pg — ABNORMAL LOW (ref 26.0–34.0)
MCHC: 30.3 g/dL (ref 30.0–36.0)
MCV: 75.5 fL — AB (ref 78.0–100.0)
Platelets: 375 10*3/uL (ref 150–400)
RBC: 3.59 MIL/uL — AB (ref 4.22–5.81)
RDW: 23.1 % — AB (ref 11.5–15.5)
WBC: 19.1 10*3/uL — AB (ref 4.0–10.5)

## 2017-10-04 LAB — PHOSPHORUS: Phosphorus: 2.8 mg/dL (ref 2.5–4.6)

## 2017-10-04 LAB — MAGNESIUM: Magnesium: 2.1 mg/dL (ref 1.7–2.4)

## 2017-10-04 LAB — CULTURE, BLOOD (ROUTINE X 2)
Culture: NO GROWTH
Special Requests: ADEQUATE

## 2017-10-04 MED ORDER — DEXTROSE 50 % IV SOLN
25.0000 mL | Freq: Once | INTRAVENOUS | Status: AC
  Administered 2017-10-04: 25 mL via INTRAVENOUS

## 2017-10-04 MED ORDER — DEXTROSE 50 % IV SOLN
INTRAVENOUS | Status: AC
  Administered 2017-10-04: 25 mL
  Filled 2017-10-04: qty 50

## 2017-10-04 MED ORDER — POTASSIUM CHLORIDE CRYS ER 20 MEQ PO TBCR
40.0000 meq | EXTENDED_RELEASE_TABLET | ORAL | Status: AC
  Administered 2017-10-04 (×2): 40 meq via ORAL
  Filled 2017-10-04 (×2): qty 2

## 2017-10-04 NOTE — Progress Notes (Signed)
Patient refused dressing change x2. Pt wants dressing change later tonight. Will attempt dressing change before this shift ends.

## 2017-10-04 NOTE — Consult Note (Signed)
   [facility] CM Inpatient Consult   10/04/2017  [name] March 25, 1951 [id number]    [facility] Care Management referral received from inpatient RNCM. Made aware referral was recommended in the long length of stay meeting today.   Went to bedside to speak with patient. However, he is in ICU status and was resting. Did not want to disturb.   Made aware by inpatient LCSW that patient is from [facility] as a long term care resident. No identifiable [facility] Care Management needs at this time.   Made inpatient RNCM aware [facility] Care Management will not follow at this time due to patient being long term at SNF.    [name], MSN-Ed, RN,BSN [facility] Liaison [phone]

## 2017-10-04 NOTE — Progress Notes (Signed)
Foley left in today per Dr Rito Ehrlich.Flexiseal intact and drained 50cc this shift.

## 2017-10-04 NOTE — Progress Notes (Signed)
[name] PROGRESS NOTE  JODY SILAS HYQ:[id number] DOB: 14-Feb-1951 DOA: 09/29/2017  PCP: [name], MD (Inactive)  Brief History/Interval Summary: 67 yo male former smoker from [facility] with vomiting, altered mental status, hypoxia. PMHx of CAD, diastolic CHF, CKD 3, DM, HLD, HTN, OSA, Neuropathy, GI bleeding, A fib, PAD s/p Rt AKA, ESBL E coli bacteremia.  Intubated in ER and started on pressors for septic shock with concern for aspiration pneumonia.  Found to have severe hypokalemia.  Found to also have C diff.  He was stabilized in the ICU.  Extubated.  Transferred to stepdown unit.  Reason for Visit: C. difficile colitis  Consultants: None  STUDIES:  Renal u/s 3/21 >> normal kidneys Echo 3/22 >> mild LVH, EF 55 to 60%  CULTURES: Blood 3/21 >> Urine 3/21 >> Enterococcus, Acinetobacter >>  C diff PCR 3/22 >> Ag positive, toxin negative, PCR positive  ANTIBIOTICS: Vancomycin 3/21 >> 3/22 Meropenem 3/21 >> 3/25 Enteral vancomycin 3/23 >>   Unasyn 3/25>>  SIGNIFICANT EVENTS: 3/21 Admit 3/24 Off pressors, extubated  LINES/TUBES: ETT 3/21 >> 3/24   Subjective/Interval History: Patient noted to be mildly distracted.  He denies any complaints at this time.  Denies any pain.  States that his breathing is quite good.  No abdominal pain specifically.  ROS: Denies any headaches.  Objective:  Vital Signs  Vitals:   10/04/17 0400 10/04/17 0500 10/04/17 0700 10/04/17 0832  BP: 108/72 99/61    Pulse: 97 98    Resp: 13 15  15   Temp: (!) 97.5 F (36.4 C) (!) 97.3 F (36.3 C)  (!) 97.3 F (36.3 C)  TempSrc:      SpO2: 97% 98% 98%   Weight:  121.2 kg (267 lb 3.2 oz)    Height:        Intake/Output Summary (Last 24 hours) at 10/04/2017 1117 Last data filed at 10/04/2017 0541 Gross per 24 hour  Intake 2010 ml  Output 1695 ml  Net 315 ml   Filed Weights   10/01/17 0500 10/03/17 0408 10/04/17 0500  Weight: 116.6 kg (257 lb 0.9 oz)  116.6 kg (257 lb 0.9 oz) 121.2 kg (267 lb 3.2 oz)    General appearance: alert, cooperative, appears stated age, distracted and no distress Resp: Diminished air entry at the bases.  No wheezing rales or rhonchi.  Normal effort at rest. Cardio: regular rate and rhythm, S1, S2 normal, no murmur, click, rub or gallop GI: soft, non-tender; bowel sounds normal; no masses,  no organomegaly Extremities: extremities normal, atraumatic, no cyanosis or edema Neurologic: No focal neurological deficits.  Appears to be distracted.  Lab Results:  Data Reviewed: I have personally reviewed following labs and imaging studies  CBC: Recent Labs  Lab 09/29/17 0441 09/30/17 0342 10/01/17 0516 10/02/17 0559 10/03/17 0618 10/04/17 0420  WBC 39.4* 31.6* 16.7* 16.8* 13.5* 19.1*  NEUTROABS 36.6*  --   --   --   --   --   HGB 9.5* 8.6* 7.1* 7.0* 7.7* 8.2*  HCT 30.9* 27.4* 23.1* 22.9* 25.3* 27.1*  MCV 72.4* 71.2* 72.4* 74.4* 75.5* 75.5*  PLT 737* 636* 376 334 292 952    Basic Metabolic Panel: Recent Labs  Lab 10/01/17 1003 10/01/17 1657 10/02/17 0559 10/02/17 1555 10/03/17 0618 10/04/17 0420  NA  --  140 144 139 141 143  K  --  2.9* 3.0* 2.7* 3.4* 3.0*  CL  --  113* 115* 112* 114* 113*  CO2  --  17* 19* 19* 19* 23  GLUCOSE  --  197* 150* 141* 116* 64*  BUN  --  27* 27* 26* 22* 16  CREATININE  --  1.06 0.94 0.94 0.81 0.76  CALCIUM  --  6.7* 6.9* 6.8* 6.9* 7.1*  MG 2.0 1.9 1.9 1.7 1.9 2.1  PHOS 2.2* 1.8* 2.2*  --  2.4* 2.8    GFR: Estimated Creatinine Clearance: 123.7 mL/min (by C-G formula based on SCr of 0.76 mg/dL).  Liver Function Tests: Recent Labs  Lab 09/29/17 0441 10/01/17 0516  AST 29 21  ALT 16* 13*  ALKPHOS 339* 198*  BILITOT 0.5 0.7  PROT 6.4* 5.1*  ALBUMIN 1.8* 1.4*    Coagulation Profile: Recent Labs  Lab 09/29/17 0553  INR 3.04    Cardiac Enzymes: Recent Labs  Lab 09/29/17 0553  TROPONINI 0.04*    CBG: Recent Labs  Lab 10/03/17 2103 10/04/17 0744  10/04/17 0745 10/04/17 0823 10/04/17 0825  GLUCAP 99 52* 49* 63* 76     Recent Results (from the past 240 hour(s))  Blood Culture (routine x 2)     Status: Abnormal (Preliminary result)   Collection Time: 09/29/17  4:34 AM  Result Value Ref Range Status   Specimen Description   Final    BLOOD CENTRAL LINE Performed at [facility], [place] [address], [place], [place] [address]    Special Requests   Final    BOTTLES DRAWN AEROBIC AND ANAEROBIC Blood Culture adequate volume Performed at [place] [address], [place], [place] [address]    Culture  Setup Time (A)  Final    GRAM VARIABLE ROD ANAEROBIC BOTTLE ONLY CRITICAL RESULT CALLED TO, READ BACK BY AND VERIFIED WITH: T. GREEN, RPHARMD ([facility]) AT 1513 ON 09/30/17 BY C. JESSUP, MLT.    Culture (A)  Final    GRAM VARIABLE ROD HOLDING FOR POSSIBLE ANAEROBE Performed at [facility], [place] [address], [place], [place] [address]    Report Status PENDING  Incomplete  Blood Culture ID Panel (Reflexed)     Status: None   Collection Time: 09/29/17  4:34 AM  Result Value Ref Range Status   Enterococcus species NOT DETECTED NOT DETECTED Final   Listeria monocytogenes NOT DETECTED NOT DETECTED Final   Staphylococcus species NOT DETECTED NOT DETECTED Final   Staphylococcus aureus NOT DETECTED NOT DETECTED Final   Streptococcus species NOT DETECTED NOT DETECTED Final   Streptococcus agalactiae NOT DETECTED NOT DETECTED Final   Streptococcus pneumoniae NOT DETECTED NOT DETECTED Final   Streptococcus pyogenes NOT DETECTED NOT DETECTED Final   Acinetobacter baumannii NOT DETECTED NOT DETECTED Final   Enterobacteriaceae species NOT DETECTED NOT DETECTED Final   Enterobacter cloacae complex NOT DETECTED NOT DETECTED Final   Escherichia coli NOT DETECTED NOT DETECTED Final   Klebsiella oxytoca NOT DETECTED NOT DETECTED Final   Klebsiella pneumoniae NOT DETECTED NOT DETECTED Final   Proteus species  NOT DETECTED NOT DETECTED Final   Serratia marcescens NOT DETECTED NOT DETECTED Final   Haemophilus influenzae NOT DETECTED NOT DETECTED Final   Neisseria meningitidis NOT DETECTED NOT DETECTED Final   Pseudomonas aeruginosa NOT DETECTED NOT DETECTED Final   Candida albicans NOT DETECTED NOT DETECTED Final   Candida glabrata NOT DETECTED NOT DETECTED Final   Candida krusei NOT DETECTED NOT DETECTED Final   Candida parapsilosis NOT DETECTED NOT DETECTED Final   Candida tropicalis NOT DETECTED NOT DETECTED Final    Comment: Performed at [facility], [place] [address], [place], [place] [address]  Blood Culture (routine x 2)     Status: None   Collection Time: 09/29/17  4:41 AM  Result Value Ref Range Status   Specimen Description   Final    BLOOD LEFT ANTECUBITAL Performed at [place] [address], [place], [place] [address]    Special Requests   Final    BOTTLES DRAWN AEROBIC AND ANAEROBIC Blood Culture adequate volume Performed at [place] [address] [place], [place] [address]    Culture   Final    NO GROWTH 5 DAYS Performed at [facility], [place] [address], [place], [place] [address]    Report Status 10/04/2017 FINAL  Final  Urine culture     Status: Abnormal   Collection Time: 09/29/17  5:13 AM  Result Value Ref Range Status   Specimen Description   Final    URINE, CATHETERIZED Performed at [place] [address], [place], [place] [address]    Special Requests   Final    NONE Performed at [facility], [place] [address], [place], [place] [address]    Culture (A)  Final    70,000 COLONIES/mL VANCOMYCIN RESISTANT ENTEROCOCCUS ISOLATED 30,000 COLONIES/mL ACINETOBACTER CALCOACETICUS/BAUMANNII COMPLEX    Report Status 10/03/2017 FINAL  Final   Organism ID, Bacteria VANCOMYCIN RESISTANT ENTEROCOCCUS ISOLATED (A)  Final   Organism ID, Bacteria ACINETOBACTER  CALCOACETICUS/BAUMANNII COMPLEX (A)  Final      Susceptibility   Acinetobacter calcoaceticus/baumannii complex - MIC*    CEFTAZIDIME 16 INTERMEDIATE Intermediate     CEFTRIAXONE 16 INTERMEDIATE Intermediate     CIPROFLOXACIN >=4 RESISTANT Resistant     GENTAMICIN 4 SENSITIVE Sensitive     IMIPENEM >=16 RESISTANT Resistant     PIP/TAZO >=128 RESISTANT Resistant     TRIMETH/SULFA >=320 RESISTANT Resistant     CEFEPIME 8 SENSITIVE Sensitive     AMPICILLIN/SULBACTAM 4 SENSITIVE Sensitive     * 30,000 COLONIES/mL ACINETOBACTER CALCOACETICUS/BAUMANNII COMPLEX   Vancomycin resistant enterococcus isolated - MIC*    AMPICILLIN <=2 SENSITIVE Sensitive     LEVOFLOXACIN >=8 RESISTANT Resistant     NITROFURANTOIN <=16 SENSITIVE Sensitive     VANCOMYCIN >=32 RESISTANT Resistant     LINEZOLID 2 SENSITIVE Sensitive     * 70,000 COLONIES/mL VANCOMYCIN RESISTANT ENTEROCOCCUS ISOLATED  MRSA PCR Screening     Status: None   Collection Time: 09/29/17  7:20 AM  Result Value Ref Range Status   MRSA by PCR NEGATIVE NEGATIVE Final    Comment:        The GeneXpert MRSA Assay (FDA approved for NASAL specimens only), is one component of a comprehensive MRSA colonization surveillance program. It is not intended to diagnose MRSA infection nor to guide or monitor treatment for MRSA infections. Performed at [facility], [place] [address], [place], [place] [address]   C difficile quick scan w PCR reflex     Status: Abnormal   Collection Time: 09/30/17  1:30 PM  Result Value Ref Range Status   C Diff antigen POSITIVE (A) NEGATIVE Final   C Diff toxin NEGATIVE NEGATIVE Final   C Diff interpretation Results are indeterminate. See PCR results.  Final    Comment: Performed at [facility], [place] [address] [place],  [address]  C. Diff by PCR, Reflexed     Status: Abnormal   Collection Time: 09/30/17  1:30 PM  Result  Value Ref Range Status   Toxigenic C. Difficile by  PCR POSITIVE (A) NEGATIVE Final    Comment: Positive for toxigenic C. difficile with little to no toxin production. Only treat if clinical presentation suggests symptomatic illness. Performed at [facility], [place] [address], [place], [place] [address]       Radiology Studies: No results found.   Medications:  Scheduled: . aspirin EC  81 mg Oral Daily  . atorvastatin  80 mg Oral q1800  . chlorhexidine gluconate (MEDLINE KIT)  15 mL Mouth Rinse BID  . Chlorhexidine Gluconate Cloth  6 each Topical Daily  . collagenase   Topical Daily  . dextrose  25 mL Intravenous Once  . enoxaparin (LOVENOX) injection  120 mg Subcutaneous Q12H  . famotidine  20 mg Oral QHS  . ferrous sulfate  325 mg Oral BID WC  . gabapentin  600 mg Oral BID  . Gerhardt's butt cream   Topical TID  . hydrocerin   Topical BID  . hydrocortisone sod succinate (SOLU-CORTEF) inj  50 mg Intravenous Q12H  . insulin aspart  0-20 Units Subcutaneous TID WC  . insulin aspart  0-5 Units Subcutaneous QHS  . insulin detemir  30 Units Subcutaneous Daily  . potassium chloride  40 mEq Oral Q4H  . protein supplement shake  11 oz Oral BID BM  . vancomycin  125 mg Oral QID  . vitamin C  250 mg Oral BID   Continuous: . sodium chloride 30 mL/hr at 10/03/17 0605  . ampicillin-sulbactam (UNASYN) IV Stopped (10/04/17 0550)   TDV:[id number], albuterol, HYDROmorphone (DILAUDID) injection, ondansetron (ZOFRAN) IV, oxyCODONE, sodium chloride flush  Assessment/Plan:  Active Problems:   Septic shock (HCC)    Septic shock Secondary to aspiration pneumonia, UTI and C. difficile.  He was on pressors in the intensive care unit.  Currently stable.  Off of pressors for the last few days.  Remains on stress dose steroids which is being tapered down slowly.  Aspiration pneumonia/acute hypoxic respiratory failure/history of obstructive sleep apnea Extubated on 3/24.  Respiratory status appears to be stable.  Continue with CPAP at  nighttime.  Urinary tract infection Less than 100,000 colonies noted of Acinetobacter as well as VRE.  Sensitivities reviewed.  Patient was initially on broad-spectrum antibiotics.  Changed over to Unasyn yesterday.  Continue and plan to treat for 4 more days.  C. difficile colitis Stool output appears to have reduced.  Continue with oral vancomycin.  Today is day 4 of 21.  Abdomen is soft and benign.  PVCs/nonsustained VT Echocardiogram showed normal systolic function.  He is noted to have low potassium which will be repleted aggressively.  Monitor on telemetry for now.  Magnesium was noted to be normal.  He is asymptomatic.  Acute renal failure This has resolved with IV fluids.  Hypokalemia Replete potassium aggressively.  Magnesium and phosphorus levels normal.  Unstageable sacral wound Present prior to admission.  Wound care to follow.  He is noted to have a Foley catheter.  Patient tells me that he did not have it at the time of admission.  Nursing staff did investigate and apparently he did not come in with a Foley catheter.  So this will have to be removed in the next 1-2 days depending on patient's progress.  Bradycardia from hypokalemia Resolved.  History of coronary artery disease, chronic diastolic CHF, atrial fibrillation Stable.  Currently on Lovenox and start of Xarelto.  Xarelto can be resumed soon when closer to discharge.  Blood pressure lowering agents on hold since his blood pressure remains soft.  Anemia of chronic disease Hemoglobin is stable.  Continue to monitor.  No evidence of overt bleeding.  Severe protein calorie malnutrition Encourage oral intake.  Acute metabolic encephalopathy Appears to have improved.  Could be close to baseline.  Diabetes mellitus type II Noted to have hypoglycemic episodes this morning.  He is noted to be on Levemir and SSI.  Hold Levemir for today.  Encourage oral intake.  DVT Prophylaxis: Lovenox    Code Status: Full Code    Family Communication: No family at bedisde  Disposition Plan: Management as outlined above.  Due to low glucose levels we will hold transfer for now.    LOS: 5 days   [place] [facility] Pager [phone] 10/04/2017, 11:17 AM  If 7PM-7AM, please contact night-coverage at [email or web address], password [facility]

## 2017-10-04 NOTE — Progress Notes (Signed)
Patients cbg 49 at 0800. 25 ccD50 given per protocol.Repeat CBG76. Also had 6 beats of vtach.Dr Rae Roam notifed.

## 2017-10-04 NOTE — Progress Notes (Signed)
Spoke with pt regarding cpap.  Pt stated that the can't tolerate it and will not use it.  Pt was advised that RT is available all night should he change his mind.  RN aware.

## 2017-10-05 DIAGNOSIS — J9621 Acute and chronic respiratory failure with hypoxia: Secondary | ICD-10-CM

## 2017-10-05 LAB — GLUCOSE, CAPILLARY
GLUCOSE-CAPILLARY: 170 mg/dL — AB (ref 65–99)
GLUCOSE-CAPILLARY: 188 mg/dL — AB (ref 65–99)
Glucose-Capillary: 113 mg/dL — ABNORMAL HIGH (ref 65–99)
Glucose-Capillary: 87 mg/dL (ref 65–99)

## 2017-10-05 LAB — CBC
HCT: 28.3 % — ABNORMAL LOW (ref 39.0–52.0)
Hemoglobin: 8.6 g/dL — ABNORMAL LOW (ref 13.0–17.0)
MCH: 22.9 pg — AB (ref 26.0–34.0)
MCHC: 30.4 g/dL (ref 30.0–36.0)
MCV: 75.3 fL — ABNORMAL LOW (ref 78.0–100.0)
PLATELETS: 417 10*3/uL — AB (ref 150–400)
RBC: 3.76 MIL/uL — ABNORMAL LOW (ref 4.22–5.81)
RDW: 23 % — ABNORMAL HIGH (ref 11.5–15.5)
WBC: 23.5 10*3/uL — ABNORMAL HIGH (ref 4.0–10.5)

## 2017-10-05 LAB — BASIC METABOLIC PANEL
ANION GAP: 9 (ref 5–15)
BUN: 12 mg/dL (ref 6–20)
CHLORIDE: 109 mmol/L (ref 101–111)
CO2: 21 mmol/L — ABNORMAL LOW (ref 22–32)
Calcium: 7.1 mg/dL — ABNORMAL LOW (ref 8.9–10.3)
Creatinine, Ser: 0.77 mg/dL (ref 0.61–1.24)
Glucose, Bld: 193 mg/dL — ABNORMAL HIGH (ref 65–99)
POTASSIUM: 3.4 mmol/L — AB (ref 3.5–5.1)
SODIUM: 139 mmol/L (ref 135–145)

## 2017-10-05 LAB — MAGNESIUM: MAGNESIUM: 1.8 mg/dL (ref 1.7–2.4)

## 2017-10-05 NOTE — Progress Notes (Signed)
Pt. refused CPAP s/u, aware to notify if needed.

## 2017-10-05 NOTE — Progress Notes (Signed)
Patient transferred to room 1406. Telemetry applied and VS taken. Pt oriented to unit.

## 2017-10-05 NOTE — Progress Notes (Signed)
PROGRESS NOTE    [name]  FEX:[id number] DOB: [birth year] DOA: 09/29/2017 PCP: [name], MD (Inactive)    Brief Narrative:   67 yo male former smoker from [facility] with vomiting, altered mental status, hypoxia. PMHx of CAD, diastolic CHF, CKD 3, DM, HLD, HTN, OSA, Neuropathy, GI bleeding, A fib, PAD s/p Rt AKA, ESBL E coli bacteremia.  Intubated in ER and started on pressors for septic shock with concern for aspiration pneumonia.  Found to have severe hypokalemia.  Found to also have C diff.  He was stabilized in the ICU.  Extubated.  Transferred to stepdown unit.  Assessment & Plan:   Active Problems:   Septic shock (HCC)   Septic shock secondary to aspiration pneumonitis, urinary tract infection and see differential colitis. Was on IV pressors in ICU currently off pressors and stable and on stress dose steroids.  Sepsis is improving.  Acute hypoxic respiratory failure Secondary to aspiration pneumonitis. Patient was intubated and extubated on 3/24.    History of obstructive sleep apnea requiring CPAP at night   Urinary tract infection showed [place] urine culture showed Acinetobacter as well as VRE.  On Unasyn to complete the course.   C. difficile colitis currently on oral vancomycin to complete the course.  Stool output has improved his nausea vomiting and abdominal pain has improved.   PVCs/nonsustained VT Echocardiogram showed normal systolic function monitor on telemetry.  Asymptomatic.    Acute renal failure improved   Hypokalemia replaced  History of coronary artery disease/chronic diastolic heart failure Patient currently denies any chest pain or shortness of breath.  He appears euvolemic.   Atrial fibrillation rate controlled on Lovenox.  Plan to transition to Xarelto in 1-2 days.   Anemia of chronic disease hemoglobin stable   Type 2 diabetes mellitus slightly uncontrolled with hyperglycemia as his Levemir was held yesterday  because of hypoglycemic episodes.  We will start them l low doses BG (last 3)  Recent Labs    10/04/17 2149 10/05/17 0731 10/05/17 1221  GLUCAP 202* 170* 188*      DVT prophylaxis:  Code Status:  Family Communication: Disposition Plan:   Consultants:   None   STUDIES:  Renal u/s 3/21 >> normal kidneys Echo 3/22 >> mild LVH, EF 55 to 60%  CULTURES: Blood 3/21 >> Urine 3/21 >> Enterococcus, Acinetobacter >>  C diff PCR 3/22 >> Ag positive, toxin negative, PCR positive  ANTIBIOTICS: Vancomycin 3/21 >> 3/22 Meropenem 3/21 >> 3/25 Enteral vancomycin 3/23 >>   Unasyn 3/25>>  SIGNIFICANT EVENTS: 3/21 Admit 3/24 Off pressors, extubated  LINES/TUBES: ETT 3/21 >> 3/24   Subjective: Patient reports feeling much better today.  Objective: Vitals:   10/05/17 0434 10/05/17 0500 10/05/17 0600 10/05/17 0700  BP: 106/62  (!) 99/58   Pulse:  100 100 93  Resp: _0 Temp: (!) 97.3 F (36.3 C) (!) 97.3 F (36.3 C) (!) 97.5 F (36.4 C) 97.7 F (36.5 C)  TempSrc:      SpO2:  98% 99% 96%  Weight:   121.1 kg (266 lb 15.6 oz)   Height:        Intake/Output Summary (Last 24 hours) at 10/05/2017 1003 Last data filed at 10/05/2017 0640 Gross per 24 hour  Intake 1066.5 ml  Output 3430 ml  Net -2363.5 ml   Filed Weights   10/03/17 0408 10/04/17 0500 10/05/17 0600  Weight: 116.6 kg (257 lb 0.9 oz) 121.2 kg (267 lb 3.2  oz) 121.1 kg (266 lb 15.6 oz)    Examination:  General exam: Appears calm and comfortable  Respiratory system: Clear to auscultation. Respiratory effort normal. Cardiovascular system: S1 & S2 heard, RRR. No JVD, murmurs, rubs, gallops or clicks. No pedal edema. Gastrointestinal system: Abdomen is nondistended, soft and nontender. No organomegaly or masses felt. Normal bowel sounds heard. Central nervous system: Alert and oriented. No focal neurological deficits. Extremities: Symmetric 5 x 5 power. Skin: No rashes, lesions or  ulcers Psychiatry: Judgement and insight appear normal. Mood & affect appropriate.     Data Reviewed: I have personally reviewed following labs and imaging studies  CBC: Recent Labs  Lab 09/29/17 0441  10/01/17 0516 10/02/17 0559 10/03/17 0618 10/04/17 0420 10/05/17 0314  WBC 39.4*   < > 16.7* 16.8* 13.5* 19.1* 23.5*  NEUTROABS 36.6*  --   --   --   --   --   --   HGB 9.5*   < > 7.1* 7.0* 7.7* 8.2* 8.6*  HCT 30.9*   < > 23.1* 22.9* 25.3* 27.1* 28.3*  MCV 72.4*   < > 72.4* 74.4* 75.5* 75.5* 75.3*  PLT 737*   < > 376 334 292 375 417*   < > = values in this interval not displayed.   Basic Metabolic Panel: Recent Labs  Lab 10/01/17 1003 10/01/17 1657 10/02/17 0559 10/02/17 1555 10/03/17 0618 10/04/17 0420 10/05/17 0314  NA  --  140 144 139 141 143 139  K  --  2.9* 3.0* 2.7* 3.4* 3.0* 3.4*  CL  --  113* 115* 112* 114* 113* 109  CO2  --  17* 19* 19* 19* 23 21*  GLUCOSE  --  197* 150* 141* 116* 64* 193*  BUN  --  27* 27* 26* 22* 16 12  CREATININE  --  1.06 0.94 0.94 0.81 0.76 0.77  CALCIUM  --  6.7* 6.9* 6.8* 6.9* 7.1* 7.1*  MG 2.0 1.9 1.9 1.7 1.9 2.1 1.8  PHOS 2.2* 1.8* 2.2*  --  2.4* 2.8  --    GFR: Estimated Creatinine Clearance: 123.7 mL/min (by C-G formula based on SCr of 0.77 mg/dL). Liver Function Tests: Recent Labs  Lab 09/29/17 0441 10/01/17 0516  AST 29 21  ALT 16* 13*  ALKPHOS 339* 198*  BILITOT 0.5 0.7  PROT 6.4* 5.1*  ALBUMIN 1.8* 1.4*   No results for input(s): LIPASE, AMYLASE in the last 168 hours. No results for input(s): AMMONIA in the last 168 hours. Coagulation Profile: Recent Labs  Lab 09/29/17 0553  INR 3.04   Cardiac Enzymes: Recent Labs  Lab 09/29/17 0553  TROPONINI 0.04*   BNP (last 3 results) No results for input(s): PROBNP in the last 8760 hours. HbA1C: No results for input(s): HGBA1C in the last 72 hours. CBG: Recent Labs  Lab 10/04/17 0825 10/04/17 1227 10/04/17 1637 10/04/17 2149 10/05/17 0731  GLUCAP 76 192*  133* 202* 170*   Lipid Profile: No results for input(s): CHOL, HDL, LDLCALC, TRIG, CHOLHDL, LDLDIRECT in the last 72 hours. Thyroid Function Tests: No results for input(s): TSH, T4TOTAL, FREET4, T3FREE, THYROIDAB in the last 72 hours. Anemia Panel: No results for input(s): VITAMINB12, FOLATE, FERRITIN, TIBC, IRON, RETICCTPCT in the last 72 hours. Sepsis Labs: Recent Labs  Lab 09/29/17 0458 09/29/17 0553 09/29/17 0848 09/30/17 0342 10/01/17 0516  PROCALCITON  --  5.95  --  20.14 7.27  LATICACIDVEN 4.47* 3.7* 3.1*  --   --     Recent Results (from the  past 240 hour(s))  Blood Culture (routine x 2)     Status: Abnormal (Preliminary result)   Collection Time: 09/29/17  4:34 AM  Result Value Ref Range Status   Specimen Description   Final    BLOOD CENTRAL LINE Performed at [facility], [place] [address] [place], [place] [address]    Special Requests   Final    BOTTLES DRAWN AEROBIC AND ANAEROBIC Blood Culture adequate volume Performed at [place] [address], [place], [place] [address]    Culture  Setup Time (A)  Final    GRAM VARIABLE ROD ANAEROBIC BOTTLE ONLY CRITICAL RESULT CALLED TO, READ BACK BY AND VERIFIED WITH: T. GREEN, RPHARMD ([facility]) AT 1513 ON 09/30/17 BY C. JESSUP, MLT.    Culture (A)  Final    GRAM VARIABLE ROD HOLDING FOR POSSIBLE ANAEROBE Performed at [facility], [place] [address], [place], [place] [address]    Report Status PENDING  Incomplete  Blood Culture ID Panel (Reflexed)     Status: None   Collection Time: 09/29/17  4:34 AM  Result Value Ref Range Status   Enterococcus species NOT DETECTED NOT DETECTED Final   Listeria monocytogenes NOT DETECTED NOT DETECTED Final   Staphylococcus species NOT DETECTED NOT DETECTED Final   Staphylococcus aureus NOT DETECTED NOT DETECTED Final   Streptococcus species NOT DETECTED NOT DETECTED Final   Streptococcus agalactiae NOT DETECTED NOT DETECTED Final    Streptococcus pneumoniae NOT DETECTED NOT DETECTED Final   Streptococcus pyogenes NOT DETECTED NOT DETECTED Final   Acinetobacter baumannii NOT DETECTED NOT DETECTED Final   Enterobacteriaceae species NOT DETECTED NOT DETECTED Final   Enterobacter cloacae complex NOT DETECTED NOT DETECTED Final   Escherichia coli NOT DETECTED NOT DETECTED Final   Klebsiella oxytoca NOT DETECTED NOT DETECTED Final   Klebsiella pneumoniae NOT DETECTED NOT DETECTED Final   Proteus species NOT DETECTED NOT DETECTED Final   Serratia marcescens NOT DETECTED NOT DETECTED Final   Haemophilus influenzae NOT DETECTED NOT DETECTED Final   Neisseria meningitidis NOT DETECTED NOT DETECTED Final   Pseudomonas aeruginosa NOT DETECTED NOT DETECTED Final   Candida albicans NOT DETECTED NOT DETECTED Final   Candida glabrata NOT DETECTED NOT DETECTED Final   Candida krusei NOT DETECTED NOT DETECTED Final   Candida parapsilosis NOT DETECTED NOT DETECTED Final   Candida tropicalis NOT DETECTED NOT DETECTED Final    Comment: Performed at [facility], [place] [address], [place], [place] [address]  Blood Culture (routine x 2)     Status: None   Collection Time: 09/29/17  4:41 AM  Result Value Ref Range Status   Specimen Description   Final    BLOOD LEFT ANTECUBITAL Performed at [facility] [address], [place], [place] [address]    Special Requests   Final    BOTTLES DRAWN AEROBIC AND ANAEROBIC Blood Culture adequate volume Performed at [facility] [address] [place], [place] [address]    Culture   Final    NO GROWTH 5 DAYS Performed at [facility], [place] [address], [place], [place] [address]    Report Status 10/04/2017 FINAL  Final  Urine culture     Status: Abnormal   Collection Time: 09/29/17  5:13 AM  Result Value Ref Range Status   Specimen Description   Final    URINE, CATHETERIZED Performed at [facility] [address],  [place],  [address]  Special Requests   Final    NONE Performed at [facility], [place] [address], [place], [place] [address]    Culture (A)  Final    70,000 COLONIES/mL VANCOMYCIN RESISTANT ENTEROCOCCUS ISOLATED 30,000 COLONIES/mL ACINETOBACTER CALCOACETICUS/BAUMANNII COMPLEX    Report Status 10/03/2017 FINAL  Final   Organism ID, Bacteria VANCOMYCIN RESISTANT ENTEROCOCCUS ISOLATED (A)  Final   Organism ID, Bacteria ACINETOBACTER CALCOACETICUS/BAUMANNII COMPLEX (A)  Final      Susceptibility   Acinetobacter calcoaceticus/baumannii complex - MIC*    CEFTAZIDIME 16 INTERMEDIATE Intermediate     CEFTRIAXONE 16 INTERMEDIATE Intermediate     CIPROFLOXACIN >=4 RESISTANT Resistant     GENTAMICIN 4 SENSITIVE Sensitive     IMIPENEM >=16 RESISTANT Resistant     PIP/TAZO >=128 RESISTANT Resistant     TRIMETH/SULFA >=320 RESISTANT Resistant     CEFEPIME 8 SENSITIVE Sensitive     AMPICILLIN/SULBACTAM 4 SENSITIVE Sensitive     * 30,000 COLONIES/mL ACINETOBACTER CALCOACETICUS/BAUMANNII COMPLEX   Vancomycin resistant enterococcus isolated - MIC*    AMPICILLIN <=2 SENSITIVE Sensitive     LEVOFLOXACIN >=8 RESISTANT Resistant     NITROFURANTOIN <=16 SENSITIVE Sensitive     VANCOMYCIN >=32 RESISTANT Resistant     LINEZOLID 2 SENSITIVE Sensitive     * 70,000 COLONIES/mL VANCOMYCIN RESISTANT ENTEROCOCCUS ISOLATED  MRSA PCR Screening     Status: None   Collection Time: 09/29/17  7:20 AM  Result Value Ref Range Status   MRSA by PCR NEGATIVE NEGATIVE Final    Comment:        The GeneXpert MRSA Assay (FDA approved for NASAL specimens only), is one component of a comprehensive MRSA colonization surveillance program. It is not intended to diagnose MRSA infection nor to guide or monitor treatment for MRSA infections. Performed at [facility], [place] [address], [place], [place] [address]   C difficile quick scan w PCR reflex     Status: Abnormal   Collection  Time: 09/30/17  1:30 PM  Result Value Ref Range Status   C Diff antigen POSITIVE (A) NEGATIVE Final   C Diff toxin NEGATIVE NEGATIVE Final   C Diff interpretation Results are indeterminate. See PCR results.  Final    Comment: Performed at [facility], [place] [address], [place], [place] [address]  C. Diff by PCR, Reflexed     Status: Abnormal   Collection Time: 09/30/17  1:30 PM  Result Value Ref Range Status   Toxigenic C. Difficile by PCR POSITIVE (A) NEGATIVE Final    Comment: Positive for toxigenic C. difficile with little to no toxin production. Only treat if clinical presentation suggests symptomatic illness. Performed at [facility], [place] [address], [place], [place] [address]          Radiology Studies: No results found.      Scheduled Meds: . aspirin EC  81 mg Oral Daily  . atorvastatin  80 mg Oral q1800  . chlorhexidine gluconate (MEDLINE KIT)  15 mL Mouth Rinse BID  . Chlorhexidine Gluconate Cloth  6 each Topical Daily  . collagenase   Topical Daily  . enoxaparin (LOVENOX) injection  120 mg Subcutaneous Q12H  . famotidine  20 mg Oral QHS  . ferrous sulfate  325 mg Oral BID WC  . gabapentin  600 mg Oral BID  . Gerhardt's butt cream   Topical TID  . hydrocerin   Topical BID  . hydrocortisone sod succinate (SOLU-CORTEF) inj  50 mg Intravenous Q12H  .  insulin aspart  0-20 Units Subcutaneous TID WC  . insulin aspart  0-5 Units Subcutaneous QHS  . insulin detemir  30 Units Subcutaneous Daily  . protein supplement shake  11 oz Oral BID BM  . vancomycin  125 mg Oral QID  . vitamin C  250 mg Oral BID   Continuous Infusions: . sodium chloride 30 mL/hr at 10/04/17 1200  . ampicillin-sulbactam (UNASYN) IV Stopped (10/05/17 0806)     LOS: 6 days    Time spent: 35 minutes    Hosie Poisson, MD [name] Pager [phone]  If 7PM-7AM, please contact night-coverage [email or web address] Password [facility] 10/05/2017, 10:03 AM

## 2017-10-06 LAB — BASIC METABOLIC PANEL
ANION GAP: 10 (ref 5–15)
BUN: 8 mg/dL (ref 6–20)
CALCIUM: 7 mg/dL — AB (ref 8.9–10.3)
CO2: 21 mmol/L — AB (ref 22–32)
Chloride: 110 mmol/L (ref 101–111)
Creatinine, Ser: 0.73 mg/dL (ref 0.61–1.24)
GFR calc Af Amer: >60 mL/min (ref >60)
GFR calc non Af Amer: >60 mL/min (ref >60)
GLUCOSE: 206 mg/dL — AB (ref 65–99)
Potassium: 3.1 mmol/L — ABNORMAL LOW (ref 3.5–5.1)
Sodium: 141 mmol/L (ref 135–145)

## 2017-10-06 LAB — CBC WITH DIFFERENTIAL/PLATELET
BASOS PCT: 0 %
Basophils Absolute: 0 10*3/uL (ref 0.0–0.1)
EOS ABS: 0.2 10*3/uL (ref 0.0–0.7)
EOS PCT: 1 %
HEMATOCRIT: 27.3 % — AB (ref 39.0–52.0)
Hemoglobin: 8.3 g/dL — ABNORMAL LOW (ref 13.0–17.0)
LYMPHS ABS: 2.2 10*3/uL (ref 0.7–4.0)
Lymphocytes Relative: 13 %
MCH: 23.1 pg — AB (ref 26.0–34.0)
MCHC: 30.4 g/dL (ref 30.0–36.0)
MCV: 75.8 fL — ABNORMAL LOW (ref 78.0–100.0)
MONO ABS: 0.9 10*3/uL (ref 0.1–1.0)
Monocytes Relative: 5 %
NEUTROS ABS: 13.8 10*3/uL — AB (ref 1.7–7.7)
NEUTROS PCT: 81 %
Platelets: 355 10*3/uL (ref 150–400)
RBC: 3.6 MIL/uL — ABNORMAL LOW (ref 4.22–5.81)
RDW: 23.3 % — AB (ref 11.5–15.5)
WBC: 17.1 10*3/uL — ABNORMAL HIGH (ref 4.0–10.5)

## 2017-10-06 LAB — GLUCOSE, CAPILLARY
GLUCOSE-CAPILLARY: 124 mg/dL — AB (ref 65–99)
Glucose-Capillary: 121 mg/dL — ABNORMAL HIGH (ref 65–99)
Glucose-Capillary: 138 mg/dL — ABNORMAL HIGH (ref 65–99)
Glucose-Capillary: 188 mg/dL — ABNORMAL HIGH (ref 65–99)
Glucose-Capillary: 170 mg/dL — ABNORMAL HIGH (ref 65–99)

## 2017-10-06 MED ORDER — RIVAROXABAN 20 MG PO TABS
20.0000 mg | ORAL_TABLET | Freq: Every day | ORAL | Status: DC
  Administered 2017-10-06 – 2017-10-14 (×9): 20 mg via ORAL
  Filled 2017-10-06 (×9): qty 1

## 2017-10-06 MED ORDER — HYDROCORTISONE NA SUCCINATE PF 100 MG IJ SOLR
50.0000 mg | Freq: Every day | INTRAMUSCULAR | Status: DC
  Administered 2017-10-07: 50 mg via INTRAVENOUS
  Filled 2017-10-06: qty 2

## 2017-10-06 MED ORDER — POTASSIUM CHLORIDE CRYS ER 20 MEQ PO TBCR
40.0000 meq | EXTENDED_RELEASE_TABLET | Freq: Two times a day (BID) | ORAL | Status: AC
  Administered 2017-10-06 – 2017-10-07 (×2): 40 meq via ORAL
  Filled 2017-10-06 (×2): qty 2

## 2017-10-06 MED ORDER — HYDROMORPHONE HCL 1 MG/ML IJ SOLN
1.0000 mg | INTRAMUSCULAR | Status: DC | PRN
  Administered 2017-10-06: 2 mg via INTRAVENOUS
  Administered 2017-10-06: 1 mg via INTRAVENOUS
  Administered 2017-10-06 – 2017-10-12 (×37): 2 mg via INTRAVENOUS
  Filled 2017-10-06 (×39): qty 2

## 2017-10-06 NOTE — Progress Notes (Signed)
Patient continues to decline nocturnal CPAP. Order changed to prn per RT protocol.  

## 2017-10-06 NOTE — Progress Notes (Signed)
PROGRESS NOTE    [name]  XQJ:[id number] DOB: April 30, 1951 DOA: 09/29/2017 PCP: [name], MD (Inactive)    Brief Narrative:   68 yo male former smoker from [facility] with vomiting, altered mental status, hypoxia. PMHx of CAD, diastolic CHF, CKD 3, DM, HLD, HTN, OSA, Neuropathy, GI bleeding, A fib, PAD s/p Rt AKA, ESBL E coli bacteremia.  Intubated in ER and started on pressors for septic shock with concern for aspiration pneumonia.  Found to have severe hypokalemia.  Found to also have C diff.  He was stabilized in the ICU.  Extubated.  Transferred to stepdown unit.  Assessment & Plan:   Active Problems:   Septic shock (HCC)   Septic shock secondary to aspiration pneumonitis, urinary tract infection and  C diff colitis. Was on IV pressors in ICU currently off pressors and on stress dose steroids. D/c steroids as  Sepsis is improving.   Acute hypoxic respiratory failure Secondary to aspiration pneumonitis. Patient was intubated and extubated on 3/24. Wean him off the oxygen.     History of obstructive sleep apnea requiring CPAP at night   Urinary tract infection showed [place] urine culture showed Acinetobacter as well as VRE.  On Unasyn to complete the course.   C. difficile colitis currently on oral vancomycin to complete the course.  Stool output has improved his nausea vomiting has improved but abd pain is persistent.   PVCs/nonsustained VT Echocardiogram showed normal systolic function monitor on telemetry.  Asymptomatic.    Acute renal failure improved   Hypokalemia replaced, repeat in am.    Hypocalcemia: corrected calcium level is between 8 to 9.  Continue to monitor.   History of coronary artery disease/chronic diastolic heart failure Patient currently denies any chest pain or shortness of breath.  He appears euvolemic.   Atrial fibrillation rate controlled on Lovenox for anti coagulation. Changed to xarelto as he is taking by mouth.      Anemia of chronic disease hemoglobin stable   Type 2 diabetes mellitus  Better controlled.  Continue with SSI.  Recent Labs    10/06/17 0742 10/06/17 1118 10/06/17 1633  GLUCAP 138* 188* 170*      DVT prophylaxis: XARELTO Code Status: FULL CODE Family Communication: none at bedside Disposition Plan: discharge SNF When diarrhea improves.   Consultants:   None   STUDIES:  Renal u/s 3/21 >> normal kidneys Echo 3/22 >> mild LVH, EF 55 to 60%  CULTURES: Blood 3/21 >> Urine 3/21 >> Enterococcus, Acinetobacter >>  C diff PCR 3/22 >> Ag positive, toxin negative, PCR positive  ANTIBIOTICS: Vancomycin 3/21 >> 3/22 Meropenem 3/21 >> 3/25 Enteral vancomycin 3/23 >>   Unasyn 3/25>>  SIGNIFICANT EVENTS: 3/21 Admit 3/24 Off pressors, extubated  LINES/TUBES: ETT 3/21 >> 3/24   Subjective: Reports abd pain is more worse today than yesterday. No nausea or vomiting.   Objective: Vitals:   10/05/17 1906 10/05/17 2110 10/06/17 0521 10/06/17 1635  BP: 123/69 (!) 105/59 (!) 99/58 127/70  Pulse: 95 90 93 86  Resp: _0 Temp: 98.1 F (36.7 C) 97.6 F (36.4 C) 98.3 F (36.8 C) 97.6 F (36.4 C)  TempSrc: Oral Oral Oral Oral  SpO2: 100% 100% 100% 99%  Weight: 122.9 kg (270 lb 15.1 oz)  122.6 kg (270 lb 4.5 oz)   Height: 6' (1.829 m)       Intake/Output Summary (Last 24 hours) at 10/06/2017 1714 Last data filed at 10/06/2017 1500 Gross  per 24 hour  Intake 1763.5 ml  Output 2700 ml  Net -936.5 ml   Filed Weights   10/05/17 0600 10/05/17 1906 10/06/17 0521  Weight: 121.1 kg (266 lb 15.6 oz) 122.9 kg (270 lb 15.1 oz) 122.6 kg (270 lb 4.5 oz)    Examination:  General exam: Appears calm and comfortable not in any distress.  Respiratory system: Clear to auscultation. Respiratory effort normal. Diminished at bases.  Cardiovascular system: S1 & S2 heard, RRR. No JVD, murmurs, . No pedal edema. Gastrointestinal system: Abdomen is non distended, soft,  tender generalized. .Normal bowel sounds heard. Rectal tube in place.  Central nervous system: Alert and oriented. No focal neurological deficits. Extremities: Symmetric 5 x 5 power. Skin: No rashes, lesions or ulcers Psychiatry:. Mood & affect appropriate.     Data Reviewed: I have personally reviewed following labs and imaging studies  CBC: Recent Labs  Lab 10/02/17 0559 10/03/17 0618 10/04/17 0420 10/05/17 0314 10/06/17 1146  WBC 16.8* 13.5* 19.1* 23.5* 17.1*  NEUTROABS  --   --   --   --  13.8*  HGB 7.0* 7.7* 8.2* 8.6* 8.3*  HCT 22.9* 25.3* 27.1* 28.3* 27.3*  MCV 74.4* 75.5* 75.5* 75.3* 75.8*  PLT 334 292 375 417* 364   Basic Metabolic Panel: Recent Labs  Lab 10/01/17 1003 10/01/17 1657 10/02/17 0559 10/02/17 1555 10/03/17 0618 10/04/17 0420 10/05/17 0314 10/06/17 1146  NA  --  140 144 139 141 143 139 141  K  --  2.9* 3.0* 2.7* 3.4* 3.0* 3.4* 3.1*  CL  --  113* 115* 112* 114* 113* 109 110  CO2  --  17* 19* 19* 19* 23 21* 21*  GLUCOSE  --  197* 150* 141* 116* 64* 193* 206*  BUN  --  27* 27* 26* 22* _0 CREATININE  --  1.06 0.94 0.94 0.81 0.76 0.77 0.73  CALCIUM  --  6.7* 6.9* 6.8* 6.9* 7.1* 7.1* 7.0*  MG 2.0 1.9 1.9 1.7 1.9 2.1 1.8  --   PHOS 2.2* 1.8* 2.2*  --  2.4* 2.8  --   --    GFR: Estimated Creatinine Clearance: 124.5 mL/min (by C-G formula based on SCr of 0.73 mg/dL). Liver Function Tests: Recent Labs  Lab 10/01/17 0516  AST 21  ALT 13*  ALKPHOS 198*  BILITOT 0.7  PROT 5.1*  ALBUMIN 1.4*   No results for input(s): LIPASE, AMYLASE in the last 168 hours. No results for input(s): AMMONIA in the last 168 hours. Coagulation Profile: No results for input(s): INR, PROTIME in the last 168 hours. Cardiac Enzymes: No results for input(s): CKTOTAL, CKMB, CKMBINDEX, TROPONINI in the last 168 hours. BNP (last 3 results) No results for input(s): PROBNP in the last 8760 hours. HbA1C: No results for input(s): HGBA1C in the last 72  hours. CBG: Recent Labs  Lab 10/05/17 2108 10/06/17 0232 10/06/17 0742 10/06/17 1118 10/06/17 1633  GLUCAP 87 121* 138* 188* 170*   Lipid Profile: No results for input(s): CHOL, HDL, LDLCALC, TRIG, CHOLHDL, LDLDIRECT in the last 72 hours. Thyroid Function Tests: No results for input(s): TSH, T4TOTAL, FREET4, T3FREE, THYROIDAB in the last 72 hours. Anemia Panel: No results for input(s): VITAMINB12, FOLATE, FERRITIN, TIBC, IRON, RETICCTPCT in the last 72 hours. Sepsis Labs: Recent Labs  Lab 09/30/17 0342 10/01/17 0516  PROCALCITON 20.14 7.27    Recent Results (from the past 240 hour(s))  Blood Culture (routine x 2)     Status: Abnormal (Preliminary result)  Collection Time: 09/29/17  4:34 AM  Result Value Ref Range Status   Specimen Description   Final    BLOOD CENTRAL LINE Performed at [facility], [place] [address] [place], [place] [address]    Special Requests   Final    BOTTLES DRAWN AEROBIC AND ANAEROBIC Blood Culture adequate volume Performed at [facility] [address] [place], [place] [address]    Culture  Setup Time (A)  Final    GRAM VARIABLE ROD ANAEROBIC BOTTLE ONLY CRITICAL RESULT CALLED TO, READ BACK BY AND VERIFIED WITH: T. GREEN, RPHARMD ([facility]) AT 1513 ON 09/30/17 BY C. JESSUP, MLT.    Culture (A)  Final    GRAM VARIABLE ROD HOLDING FOR POSSIBLE ANAEROBE Performed at [facility], [place] [address] [place], [place] [address]    Report Status PENDING  Incomplete  Blood Culture ID Panel (Reflexed)     Status: None   Collection Time: 09/29/17  4:34 AM  Result Value Ref Range Status   Enterococcus species NOT DETECTED NOT DETECTED Final   Listeria monocytogenes NOT DETECTED NOT DETECTED Final   Staphylococcus species NOT DETECTED NOT DETECTED Final   Staphylococcus aureus NOT DETECTED NOT DETECTED Final   Streptococcus species NOT DETECTED NOT DETECTED Final   Streptococcus agalactiae NOT DETECTED NOT  DETECTED Final   Streptococcus pneumoniae NOT DETECTED NOT DETECTED Final   Streptococcus pyogenes NOT DETECTED NOT DETECTED Final   Acinetobacter baumannii NOT DETECTED NOT DETECTED Final   Enterobacteriaceae species NOT DETECTED NOT DETECTED Final   Enterobacter cloacae complex NOT DETECTED NOT DETECTED Final   Escherichia coli NOT DETECTED NOT DETECTED Final   Klebsiella oxytoca NOT DETECTED NOT DETECTED Final   Klebsiella pneumoniae NOT DETECTED NOT DETECTED Final   Proteus species NOT DETECTED NOT DETECTED Final   Serratia marcescens NOT DETECTED NOT DETECTED Final   Haemophilus influenzae NOT DETECTED NOT DETECTED Final   Neisseria meningitidis NOT DETECTED NOT DETECTED Final   Pseudomonas aeruginosa NOT DETECTED NOT DETECTED Final   Candida albicans NOT DETECTED NOT DETECTED Final   Candida glabrata NOT DETECTED NOT DETECTED Final   Candida krusei NOT DETECTED NOT DETECTED Final   Candida parapsilosis NOT DETECTED NOT DETECTED Final   Candida tropicalis NOT DETECTED NOT DETECTED Final    Comment: Performed at [facility], [place] [address], [place], [place] [address]  Blood Culture (routine x 2)     Status: None   Collection Time: 09/29/17  4:41 AM  Result Value Ref Range Status   Specimen Description   Final    BLOOD LEFT ANTECUBITAL Performed at [facility] [address] [place], [place] [address]    Special Requests   Final    BOTTLES DRAWN AEROBIC AND ANAEROBIC Blood Culture adequate volume Performed at [place] [address] [place], [place] [address]    Culture   Final    NO GROWTH 5 DAYS Performed at [facility], [place] [address] [place], [place] [address]    Report Status 10/04/2017 FINAL  Final  Urine culture     Status: Abnormal   Collection Time: 09/29/17  5:13 AM  Result Value Ref Range Status   Specimen Description   Final    URINE, CATHETERIZED Performed at [place]  [address], [place],  [address]    Special Requests   Final    NONE Performed at [facility], [address] [place], [place] [address]    Culture (A)  Final    70,000 COLONIES/mL VANCOMYCIN RESISTANT ENTEROCOCCUS ISOLATED 30,000 COLONIES/mL ACINETOBACTER CALCOACETICUS/BAUMANNII COMPLEX    Report Status 10/03/2017 FINAL  Final   Organism ID, Bacteria VANCOMYCIN RESISTANT ENTEROCOCCUS ISOLATED (A)  Final   Organism ID, Bacteria ACINETOBACTER CALCOACETICUS/BAUMANNII COMPLEX (A)  Final      Susceptibility   Acinetobacter calcoaceticus/baumannii complex - MIC*    CEFTAZIDIME 16 INTERMEDIATE Intermediate     CEFTRIAXONE 16 INTERMEDIATE Intermediate     CIPROFLOXACIN >=4 RESISTANT Resistant     GENTAMICIN 4 SENSITIVE Sensitive     IMIPENEM >=16 RESISTANT Resistant     PIP/TAZO >=128 RESISTANT Resistant     TRIMETH/SULFA >=320 RESISTANT Resistant     CEFEPIME 8 SENSITIVE Sensitive     AMPICILLIN/SULBACTAM 4 SENSITIVE Sensitive     * 30,000 COLONIES/mL ACINETOBACTER CALCOACETICUS/BAUMANNII COMPLEX   Vancomycin resistant enterococcus isolated - MIC*    AMPICILLIN <=2 SENSITIVE Sensitive     LEVOFLOXACIN >=8 RESISTANT Resistant     NITROFURANTOIN <=16 SENSITIVE Sensitive     VANCOMYCIN >=32 RESISTANT Resistant     LINEZOLID 2 SENSITIVE Sensitive     * 70,000 COLONIES/mL VANCOMYCIN RESISTANT ENTEROCOCCUS ISOLATED  MRSA PCR Screening     Status: None   Collection Time: 09/29/17  7:20 AM  Result Value Ref Range Status   MRSA by PCR NEGATIVE NEGATIVE Final    Comment:        The GeneXpert MRSA Assay (FDA approved for NASAL specimens only), is one component of a comprehensive MRSA colonization surveillance program. It is not intended to diagnose MRSA infection nor to guide or monitor treatment for MRSA infections. Performed at [facility], [place] [address], [place], [place] [address]   C difficile quick scan w PCR reflex     Status: Abnormal    Collection Time: 09/30/17  1:30 PM  Result Value Ref Range Status   C Diff antigen POSITIVE (A) NEGATIVE Final   C Diff toxin NEGATIVE NEGATIVE Final   C Diff interpretation Results are indeterminate. See PCR results.  Final    Comment: Performed at [facility], [place] [address] [place], [place] [address]  C. Diff by PCR, Reflexed     Status: Abnormal   Collection Time: 09/30/17  1:30 PM  Result Value Ref Range Status   Toxigenic C. Difficile by PCR POSITIVE (A) NEGATIVE Final    Comment: Positive for toxigenic C. difficile with little to no toxin production. Only treat if clinical presentation suggests symptomatic illness. Performed at [facility], [place] [address], [place], [place] [address]          Radiology Studies: No results found.      Scheduled Meds: . aspirin EC  81 mg Oral Daily  . atorvastatin  80 mg Oral q1800  . chlorhexidine gluconate (MEDLINE KIT)  15 mL Mouth Rinse BID  . Chlorhexidine Gluconate Cloth  6 each Topical Daily  . collagenase   Topical Daily  . enoxaparin (LOVENOX) injection  120 mg Subcutaneous Q12H  . famotidine  20 mg Oral QHS  . ferrous sulfate  325 mg Oral BID WC  . gabapentin  600 mg Oral BID  . Gerhardt's butt cream   Topical TID  . hydrocerin   Topical BID  . hydrocortisone sod succinate (SOLU-CORTEF) inj  50 mg Intravenous Q12H  . insulin aspart  0-20 Units Subcutaneous TID WC  . insulin aspart  0-5  Units Subcutaneous QHS  . insulin detemir  30 Units Subcutaneous Daily  . protein supplement shake  11 oz Oral BID BM  . vancomycin  125 mg Oral QID  . vitamin C  250 mg Oral BID   Continuous Infusions: . sodium chloride 30 mL/hr at 10/05/17 2232  . ampicillin-sulbactam (UNASYN) IV 3 g (10/06/17 1702)     LOS: 7 days    Time spent: 35 minutes    Hosie Poisson, MD [name] Pager [phone]  If 7PM-7AM, please contact night-coverage [email or web address] Password [facility] 10/06/2017, 5:14 PM

## 2017-10-06 NOTE — Care Management Important Message (Signed)
Important Message  Patient Details IM Letter given to Kathy/Case Manager to present to the Patient Name: [name] MRN: [id number] Date of Birth: 1951-01-17   Medicare Important Message Given:  Yes    Caren Macadam 10/06/2017, 11:43 AMImportant Message  Patient Details  Name: [name] MRN: [id number] Date of Birth: 1950/07/22   Medicare Important Message Given:  Yes    Caren Macadam 10/06/2017, 11:43 AM

## 2017-10-06 NOTE — Progress Notes (Addendum)
ANTICOAGULATION CONSULT NOTE - Follow Up Consult  Pharmacy Consult for xarelto Indication: atrial fibrillation  No Known Allergies  Patient Measurements: Height: 6' (182.9 cm) Weight: 270 lb 4.5 oz (122.6 kg) IBW/kg (Calculated) : 77.6   Vital Signs: Temp: 97.6 F (36.4 C) (03/28 1635) Temp Source: Oral (03/28 1635) BP: 127/70 (03/28 1635) Pulse Rate: 86 (03/28 1635)  Labs: Recent Labs    10/04/17 0420 10/05/17 0314 10/06/17 1146  HGB 8.2* 8.6* 8.3*  HCT 27.1* 28.3* 27.3*  PLT 375 417* 355  CREATININE 0.76 0.77 0.73    Estimated Creatinine Clearance: 124.5 mL/min (by C-G formula based on SCr of 0.73 mg/dL).  Assessment: 67 yo M on Xarelto PTA for Afib.    Today, 10/06/2017 LMWH 120 mg given at 12 noon, to resume xarelto for Afib.    Plan:  xarelto 20 mg qsupper - start at 2200 tonight - d/w RN Cicero Duck, instructed to give at 2200 or midnight with a snack then give at 1700 on 3/29  Herby Abraham, Pharm.D. [phone] 10/06/2017 6:22 PM

## 2017-10-06 NOTE — Progress Notes (Signed)
ANTICOAGULATION CONSULT NOTE - Follow Up Consult  Pharmacy Consult for LMWH Indication: atrial fibrillation- bridge therapy while NPO and unable to take Xarelto  No Known Allergies  Patient Measurements: Height: 6' (182.9 cm) Weight: 270 lb 4.5 oz (122.6 kg) IBW/kg (Calculated) : 77.6   Vital Signs: Temp: 98.3 F (36.8 C) (03/28 0521) Temp Source: Oral (03/28 0521) BP: 99/58 (03/28 0521) Pulse Rate: 93 (03/28 0521)  Labs: Recent Labs    10/04/17 0420 10/05/17 0314 10/06/17 1146  HGB 8.2* 8.6* 8.3*  HCT 27.1* 28.3* 27.3*  PLT 375 417* 355  CREATININE 0.76 0.77 0.73    Estimated Creatinine Clearance: 124.5 mL/min (by C-G formula based on SCr of 0.73 mg/dL).  Assessment: 67 yo M on Xarelto PTA for Afib.  Pharmacy is consulted to dose Lovenox.  Last dose 3/20 at 2100.  He was given LMWH 40 mg sq x 1 dose 3/21 at 1139 am.  Admit INR elevated at 3 due to Xarelto lab interference.    Today, 10/06/2017 ARF resolving, SCr 0.73, CrCl > 100 ml/min CBC: Hgb 8.3, Plt 355 (stable, improved) Diet:  Carb modified, taking PO meds   Goal of Therapy:  Anti-Xa level 0.6-1 units/ml 4hrs after LMWH dose given Monitor platelets by anticoagulation protocol: Yes   Plan:  Continue LMWH 1 mg/kg sq q12h = 120 sq q12h F/u creatine and CBC F/u resuming Xarelto when tolerating PO intake.  Lynann Beaver PharmD, BCPS Pager [phone] 10/06/2017 3:05 PM

## 2017-10-07 LAB — GLUCOSE, CAPILLARY
GLUCOSE-CAPILLARY: 136 mg/dL — AB (ref 65–99)
GLUCOSE-CAPILLARY: 126 mg/dL — AB (ref 65–99)
Glucose-Capillary: 146 mg/dL — ABNORMAL HIGH (ref 65–99)
Glucose-Capillary: 135 mg/dL — ABNORMAL HIGH (ref 65–99)
Glucose-Capillary: 66 mg/dL (ref 65–99)

## 2017-10-07 LAB — BASIC METABOLIC PANEL
Anion gap: 7 (ref 5–15)
BUN: 8 mg/dL (ref 6–20)
CALCIUM: 6.9 mg/dL — AB (ref 8.9–10.3)
CO2: 24 mmol/L (ref 22–32)
CREATININE: 0.76 mg/dL (ref 0.61–1.24)
Chloride: 111 mmol/L (ref 101–111)
GFR calc non Af Amer: >60 mL/min (ref >60)
Glucose, Bld: 159 mg/dL — ABNORMAL HIGH (ref 65–99)
Potassium: 3.2 mmol/L — ABNORMAL LOW (ref 3.5–5.1)
SODIUM: 142 mmol/L (ref 135–145)

## 2017-10-07 LAB — CBC
HCT: 27.4 % — ABNORMAL LOW (ref 39.0–52.0)
Hemoglobin: 8.3 g/dL — ABNORMAL LOW (ref 13.0–17.0)
MCH: 23.1 pg — ABNORMAL LOW (ref 26.0–34.0)
MCHC: 30.3 g/dL (ref 30.0–36.0)
MCV: 76.3 fL — ABNORMAL LOW (ref 78.0–100.0)
PLATELETS: 350 10*3/uL (ref 150–400)
RBC: 3.59 MIL/uL — AB (ref 4.22–5.81)
RDW: 23.3 % — ABNORMAL HIGH (ref 11.5–15.5)
WBC: 14.9 10*3/uL — AB (ref 4.0–10.5)

## 2017-10-07 MED ORDER — POTASSIUM CHLORIDE CRYS ER 20 MEQ PO TBCR
40.0000 meq | EXTENDED_RELEASE_TABLET | Freq: Once | ORAL | Status: AC
  Administered 2017-10-07: 40 meq via ORAL
  Filled 2017-10-07: qty 2

## 2017-10-07 NOTE — Progress Notes (Signed)
PROGRESS NOTE    [name]  NWG:[id number] DOB: [birth year] DOA: 09/29/2017 PCP: [name], MD (Inactive)    Brief Narrative:   67 yo male former smoker from [facility] with vomiting, altered mental status, hypoxia. PMHx of CAD, diastolic CHF, CKD 3, DM, HLD, HTN, OSA, Neuropathy, GI bleeding, A fib, PAD s/p Rt AKA, ESBL E coli bacteremia.  Intubated in ER and started on pressors for septic shock with concern for aspiration pneumonia.  Found to have severe hypokalemia.  Found to also have C diff.  He was stabilized in the ICU.  Extubated.  Transferred to stepdown unit.  Assessment & Plan:   Active Problems:   Septic shock (HCC)   Septic shock secondary to aspiration pneumonitis, urinary tract infection and  C diff colitis. Was on IV pressors in ICU currently off pressors and on stress dose steroids. D/c steroids as  Sepsis is improving.   Acute hypoxic respiratory failure Secondary to aspiration pneumonitis. Patient was intubated and extubated on 3/24. Weaned him off the oxygen.  D/c telemetry.     History of obstructive sleep apnea requiring CPAP at night   Urinary tract infection showed [place] urine culture showed Acinetobacter as well as VRE.  On Unasyn to complete the course. D/c unasyn after today's dose, hopefully that will improve his diarrhea.    C. difficile colitis currently on oral vancomycin to complete the course.  Stool output has increased today.   PVCs/nonsustained VT Echocardiogram showed normal systolic function monitor on telemetry.  Asymptomatic.    Acute renal failure improved   Hypokalemia replaced, get mag levels  repeat in am.    Hypocalcemia: corrected calcium level is between 8 to 9.  Continue to monitor. Asymptomatic.   History of coronary artery disease/chronic diastolic heart failure Patient currently denies any chest pain or shortness of breath.  He appears euvolemic.   Atrial fibrillation rate controlled on  Lovenox for anti coagulation. Changed to xarelto as he is taking by mouth. Rate well controlled. D/c tele.   Anemia of chronic disease hemoglobin stable   Type 2 diabetes mellitus  Better controlled.  Continue with SSI.  Recent Labs    10/07/17 1155 10/07/17 1539 10/07/17 1750  GLUCAP 146* 135* 126*   No change in meds   DVT prophylaxis: XARELTO Code Status: FULL CODE Family Communication: none at bedside Disposition Plan: discharge SNF When diarrhea improves.   Consultants:   None   STUDIES:  Renal u/s 3/21 >> normal kidneys Echo 3/22 >> mild LVH, EF 55 to 60%  CULTURES: Blood 3/21 >> Urine 3/21 >> Enterococcus, Acinetobacter >>  C diff PCR 3/22 >> Ag positive, toxin negative, PCR positive  ANTIBIOTICS: Vancomycin 3/21 >> 3/22 Meropenem 3/21 >> 3/25 Enteral vancomycin 3/23 >>   Unasyn 3/25>>  SIGNIFICANT EVENTS: 3/21 Admit 3/24 Off pressors, extubated  LINES/TUBES: ETT 3/21 >> 3/24   Subjective: No new complaints.   Objective: Vitals:   10/06/17 2208 10/07/17 0224 10/07/17 0549 10/07/17 1344  BP: 133/78  109/65 132/75  Pulse: 90  82 89  Resp: _0 Temp: 97.9 F (36.6 C)  98.2 F (36.8 C) 98.2 F (36.8 C)  TempSrc: Oral  Oral Oral  SpO2: 100%  100% 98%  Weight:  122.5 kg (270 lb 1 oz)    Height:        Intake/Output Summary (Last 24 hours) at 10/07/2017 1841 Last data filed at 10/07/2017 1300 Gross per 24 hour  Intake  1840 ml  Output 2900 ml  Net -1060 ml   Filed Weights   10/05/17 1906 10/06/17 0521 10/07/17 0224  Weight: 122.9 kg (270 lb 15.1 oz) 122.6 kg (270 lb 4.5 oz) 122.5 kg (270 lb 1 oz)    Examination:  General exam: Appears calm and comfortable not in any distress. Off oxygen.  Respiratory system: clear no wheezing orr honchi.  Cardiovascular system: S1 & S2 heard, RRR. No JVD, murmurs, . No pedal edema. Gastrointestinal system: Abdomen is non distended, soft, tender generalized. .Normal bowel sounds heard.  Rectal tube in place.  Central nervous system: Alert and oriented. Non focal. Extremities: AKA RIGHT LE. Skin: unstageable sacral  ulcer.  Psychiatry:. Mood & affect appropriate.     Data Reviewed: I have personally reviewed following labs and imaging studies  CBC: Recent Labs  Lab 10/03/17 0618 10/04/17 0420 10/05/17 0314 10/06/17 1146 10/07/17 0454  WBC 13.5* 19.1* 23.5* 17.1* 14.9*  NEUTROABS  --   --   --  13.8*  --   HGB 7.7* 8.2* 8.6* 8.3* 8.3*  HCT 25.3* 27.1* 28.3* 27.3* 27.4*  MCV 75.5* 75.5* 75.3* 75.8* 76.3*  PLT 292 375 417* 355 088   Basic Metabolic Panel: Recent Labs  Lab 10/01/17 1003 10/01/17 1657 10/02/17 0559 10/02/17 1555 10/03/17 0618 10/04/17 0420 10/05/17 0314 10/06/17 1146 10/07/17 0454  NA  --  140 144 139 141 143 139 141 142  K  --  2.9* 3.0* 2.7* 3.4* 3.0* 3.4* 3.1* 3.2*  CL  --  113* 115* 112* 114* 113* 109 110 111  CO2  --  17* 19* 19* 19* 23 21* 21* 24  GLUCOSE  --  197* 150* 141* 116* 64* 193* 206* 159*  BUN  --  27* 27* 26* 22* _0 CREATININE  --  1.06 0.94 0.94 0.81 0.76 0.77 0.73 0.76  CALCIUM  --  6.7* 6.9* 6.8* 6.9* 7.1* 7.1* 7.0* 6.9*  MG 2.0 1.9 1.9 1.7 1.9 2.1 1.8  --   --   PHOS 2.2* 1.8* 2.2*  --  2.4* 2.8  --   --   --    GFR: Estimated Creatinine Clearance: 124.5 mL/min (by C-G formula based on SCr of 0.76 mg/dL). Liver Function Tests: Recent Labs  Lab 10/01/17 0516  AST 21  ALT 13*  ALKPHOS 198*  BILITOT 0.7  PROT 5.1*  ALBUMIN 1.4*   No results for input(s): LIPASE, AMYLASE in the last 168 hours. No results for input(s): AMMONIA in the last 168 hours. Coagulation Profile: No results for input(s): INR, PROTIME in the last 168 hours. Cardiac Enzymes: No results for input(s): CKTOTAL, CKMB, CKMBINDEX, TROPONINI in the last 168 hours. BNP (last 3 results) No results for input(s): PROBNP in the last 8760 hours. HbA1C: No results for input(s): HGBA1C in the last 72 hours. CBG: Recent Labs  Lab  10/06/17 2146 10/07/17 0734 10/07/17 1155 10/07/17 1539 10/07/17 1750  GLUCAP 124* 136* 146* 135* 126*   Lipid Profile: No results for input(s): CHOL, HDL, LDLCALC, TRIG, CHOLHDL, LDLDIRECT in the last 72 hours. Thyroid Function Tests: No results for input(s): TSH, T4TOTAL, FREET4, T3FREE, THYROIDAB in the last 72 hours. Anemia Panel: No results for input(s): VITAMINB12, FOLATE, FERRITIN, TIBC, IRON, RETICCTPCT in the last 72 hours. Sepsis Labs: Recent Labs  Lab 10/01/17 0516  PROCALCITON 7.27    Recent Results (from the past 240 hour(s))  Blood Culture (routine x 2)     Status: Abnormal (Preliminary  result)   Collection Time: 09/29/17  4:34 AM  Result Value Ref Range Status   Specimen Description   Final    BLOOD CENTRAL LINE Performed at [facility], [place] [address], [place], [place] [address]    Special Requests   Final    BOTTLES DRAWN AEROBIC AND ANAEROBIC Blood Culture adequate volume Performed at [facility] [address], [place], [place] [address]    Culture  Setup Time (A)  Final    GRAM VARIABLE ROD ANAEROBIC BOTTLE ONLY CRITICAL RESULT CALLED TO, READ BACK BY AND VERIFIED WITH: T. GREEN, RPHARMD ([facility]) AT 1513 ON 09/30/17 BY C. JESSUP, MLT.    Culture (A)  Final    GRAM VARIABLE ROD HOLDING FOR POSSIBLE ANAEROBE Performed at [facility], [place] [address], [place], [place] [address]    Report Status PENDING  Incomplete  Blood Culture ID Panel (Reflexed)     Status: None   Collection Time: 09/29/17  4:34 AM  Result Value Ref Range Status   Enterococcus species NOT DETECTED NOT DETECTED Final   Listeria monocytogenes NOT DETECTED NOT DETECTED Final   Staphylococcus species NOT DETECTED NOT DETECTED Final   Staphylococcus aureus NOT DETECTED NOT DETECTED Final   Streptococcus species NOT DETECTED NOT DETECTED Final   Streptococcus agalactiae NOT DETECTED NOT DETECTED Final   Streptococcus pneumoniae NOT DETECTED  NOT DETECTED Final   Streptococcus pyogenes NOT DETECTED NOT DETECTED Final   Acinetobacter baumannii NOT DETECTED NOT DETECTED Final   Enterobacteriaceae species NOT DETECTED NOT DETECTED Final   Enterobacter cloacae complex NOT DETECTED NOT DETECTED Final   Escherichia coli NOT DETECTED NOT DETECTED Final   Klebsiella oxytoca NOT DETECTED NOT DETECTED Final   Klebsiella pneumoniae NOT DETECTED NOT DETECTED Final   Proteus species NOT DETECTED NOT DETECTED Final   Serratia marcescens NOT DETECTED NOT DETECTED Final   Haemophilus influenzae NOT DETECTED NOT DETECTED Final   Neisseria meningitidis NOT DETECTED NOT DETECTED Final   Pseudomonas aeruginosa NOT DETECTED NOT DETECTED Final   Candida albicans NOT DETECTED NOT DETECTED Final   Candida glabrata NOT DETECTED NOT DETECTED Final   Candida krusei NOT DETECTED NOT DETECTED Final   Candida parapsilosis NOT DETECTED NOT DETECTED Final   Candida tropicalis NOT DETECTED NOT DETECTED Final    Comment: Performed at [facility], [place] [address], [place], [place] [address]  Blood Culture (routine x 2)     Status: None   Collection Time: 09/29/17  4:41 AM  Result Value Ref Range Status   Specimen Description   Final    BLOOD LEFT ANTECUBITAL Performed at [facility] [address] [place], [place] [address]    Special Requests   Final    BOTTLES DRAWN AEROBIC AND ANAEROBIC Blood Culture adequate volume Performed at [facility] [address], [place], [place] [address]    Culture   Final    NO GROWTH 5 DAYS Performed at [facility], [place] [address], [place], [place] [address]    Report Status 10/04/2017 FINAL  Final  Urine culture     Status: Abnormal   Collection Time: 09/29/17  5:13 AM  Result Value Ref Range Status   Specimen Description   Final    URINE, CATHETERIZED Performed at [facility] [address] [place], [place] [address]    Special  Requests   Final    NONE Performed at [facility], [place] [address], [place], [place] [address]    Culture (A)  Final    70,000 COLONIES/mL VANCOMYCIN RESISTANT ENTEROCOCCUS ISOLATED 30,000 COLONIES/mL ACINETOBACTER CALCOACETICUS/BAUMANNII COMPLEX    Report Status 10/03/2017 FINAL  Final   Organism ID, Bacteria VANCOMYCIN RESISTANT ENTEROCOCCUS ISOLATED (A)  Final   Organism ID, Bacteria ACINETOBACTER CALCOACETICUS/BAUMANNII COMPLEX (A)  Final      Susceptibility   Acinetobacter calcoaceticus/baumannii complex - MIC*    CEFTAZIDIME 16 INTERMEDIATE Intermediate     CEFTRIAXONE 16 INTERMEDIATE Intermediate     CIPROFLOXACIN >=4 RESISTANT Resistant     GENTAMICIN 4 SENSITIVE Sensitive     IMIPENEM >=16 RESISTANT Resistant     PIP/TAZO >=128 RESISTANT Resistant     TRIMETH/SULFA >=320 RESISTANT Resistant     CEFEPIME 8 SENSITIVE Sensitive     AMPICILLIN/SULBACTAM 4 SENSITIVE Sensitive     * 30,000 COLONIES/mL ACINETOBACTER CALCOACETICUS/BAUMANNII COMPLEX   Vancomycin resistant enterococcus isolated - MIC*    AMPICILLIN <=2 SENSITIVE Sensitive     LEVOFLOXACIN >=8 RESISTANT Resistant     NITROFURANTOIN <=16 SENSITIVE Sensitive     VANCOMYCIN >=32 RESISTANT Resistant     LINEZOLID 2 SENSITIVE Sensitive     * 70,000 COLONIES/mL VANCOMYCIN RESISTANT ENTEROCOCCUS ISOLATED  MRSA PCR Screening     Status: None   Collection Time: 09/29/17  7:20 AM  Result Value Ref Range Status   MRSA by PCR NEGATIVE NEGATIVE Final    Comment:        The GeneXpert MRSA Assay (FDA approved for NASAL specimens only), is one component of a comprehensive MRSA colonization surveillance program. It is not intended to diagnose MRSA infection nor to guide or monitor treatment for MRSA infections. Performed at [name], [place] [address] [place], [place] [address]   C difficile quick scan w PCR reflex     Status: Abnormal   Collection Time: 09/30/17  1:30 PM  Result  Value Ref Range Status   C Diff antigen POSITIVE (A) NEGATIVE Final   C Diff toxin NEGATIVE NEGATIVE Final   C Diff interpretation Results are indeterminate. See PCR results.  Final    Comment: Performed at [facility], [place] [address], [place], [place] [address]  C. Diff by PCR, Reflexed     Status: Abnormal   Collection Time: 09/30/17  1:30 PM  Result Value Ref Range Status   Toxigenic C. Difficile by PCR POSITIVE (A) NEGATIVE Final    Comment: Positive for toxigenic C. difficile with little to no toxin production. Only treat if clinical presentation suggests symptomatic illness. Performed at [facility], [place] [address], [place], [place] [address]          Radiology Studies: No results found.      Scheduled Meds: . aspirin EC  81 mg Oral Daily  . atorvastatin  80 mg Oral q1800  . chlorhexidine gluconate (MEDLINE KIT)  15 mL Mouth Rinse BID  . Chlorhexidine Gluconate Cloth  6 each Topical Daily  . collagenase   Topical Daily  . famotidine  20 mg Oral QHS  . ferrous sulfate  325 mg Oral BID WC  . gabapentin  600 mg Oral BID  . Gerhardt's butt cream   Topical TID  . hydrocerin   Topical BID  . hydrocortisone sod succinate (SOLU-CORTEF) inj  50 mg Intravenous Daily  . insulin aspart  0-20 Units Subcutaneous TID WC  . insulin aspart  0-5 Units Subcutaneous QHS  . insulin detemir  30 Units Subcutaneous Daily  . protein supplement shake  11 oz Oral BID BM  . rivaroxaban  20 mg Oral Q supper  . vancomycin  125 mg Oral QID  . vitamin C  250 mg Oral BID   Continuous Infusions: . sodium chloride 30 mL/hr at 10/07/17 1701  . ampicillin-sulbactam (UNASYN) IV Stopped (10/07/17 1810)     LOS: 8 days    Time spent: 35 minutes    Hosie Poisson, MD [name] Pager [phone]  If 7PM-7AM, please contact night-coverage [email or web address] Password [facility] 10/07/2017, 6:41 PM

## 2017-10-08 LAB — GLUCOSE, CAPILLARY
GLUCOSE-CAPILLARY: 75 mg/dL (ref 65–99)
GLUCOSE-CAPILLARY: 119 mg/dL — AB (ref 65–99)
Glucose-Capillary: 145 mg/dL — ABNORMAL HIGH (ref 65–99)

## 2017-10-08 NOTE — Progress Notes (Signed)
PROGRESS NOTE    [name]  HMC:[id number] DOB: 1951-05-14 DOA: 09/29/2017 PCP: [name], MD (Inactive)    Brief Narrative:   67 yo male former smoker from [facility] with vomiting, altered mental status, hypoxia. PMHx of CAD, diastolic CHF, CKD 3, DM, HLD, HTN, OSA, Neuropathy, GI bleeding, A fib, PAD s/p Rt AKA, ESBL E coli bacteremia.  Intubated in ER and started on pressors for septic shock with concern for aspiration pneumonia.  Found to have severe hypokalemia.  Found to also have C diff.  He was stabilized in the ICU.  Extubated.  Transferred to stepdown unit.  Assessment & Plan:   Active Problems:   Septic shock (HCC)   Septic shock secondary to aspiration pneumonitis, urinary tract infection and  C diff colitis. Was on IV pressors in ICU currently off pressors and on stress dose steroids. Discontinued steroids.  Sepsis improving.  Afebrile, leukocytosis improving.     Acute hypoxic respiratory failure Secondary to aspiration pneumonitis. Patient was intubated and extubated on 3/24. Weaned him off the oxygen.  D/c telemetry.     History of obstructive sleep apnea requiring CPAP at night   Urinary tract infection showed [place] urine culture showed Acinetobacter as well as VRE.   Completed 5 days of Unasyn.  D.c IV antibiotoics.    C. difficile colitis currently on oral vancomycin to complete the course.  Stool output has increased today.   PVCs/nonsustained VT Echocardiogram showed normal systolic function monitor on telemetry.  Asymptomatic.    Acute renal failure improved   Hypokalemia replaced,    Hypocalcemia: corrected calcium level is between 8 to 9.  Continue to monitor. Asymptomatic.   History of coronary artery disease/chronic diastolic heart failure Patient currently denies any chest pain or shortness of breath.  He appears euvolemic.   Atrial fibrillation rate controlled on Lovenox for anti coagulation. Changed to  xarelto as he is taking by mouth. Rate well controlled. D/c tele.   Anemia of chronic disease hemoglobin stable   Type 2 diabetes mellitus  Better controlled.  Continue with SSI.  Recent Labs    10/08/17 0805 10/08/17 1140 10/08/17 1630  GLUCAP 75 119* 145*   No change in meds   DVT prophylaxis: XARELTO Code Status: FULL CODE Family Communication: none at bedside Disposition Plan: discharge SNF When diarrhea improves.   Consultants:   None   STUDIES:  Renal u/s 3/21 >> normal kidneys Echo 3/22 >> mild LVH, EF 55 to 60%  CULTURES: Blood 3/21 >> Urine 3/21 >> Enterococcus, Acinetobacter >>  C diff PCR 3/22 >> Ag positive, toxin negative, PCR positive  ANTIBIOTICS: Vancomycin 3/21 >> 3/22 Meropenem 3/21 >> 3/25 Enteral vancomycin 3/23 >>   Unasyn 3/25>>  SIGNIFICANT EVENTS: 3/21 Admit 3/24 Off pressors, extubated  LINES/TUBES: ETT 3/21 >> 3/24   Subjective: No new complaints. Does not want me to d/c iv dilaudid.  Objective: Vitals:   10/07/17 2325 10/08/17 0247 10/08/17 0602 10/08/17 1333  BP: 124/68  (!) 114/57 (!) 104/52  Pulse: 90  85 94  Resp: _0 Temp: 97.7 F (36.5 C)  97.7 F (36.5 C) 97.9 F (36.6 C)  TempSrc: Oral  Oral Oral  SpO2: 98%  100% 98%  Weight:  125.9 kg (277 lb 9 oz)    Height:        Intake/Output Summary (Last 24 hours) at 10/08/2017 1943 Last data filed at 10/08/2017 1500 Gross per 24 hour  Intake 1889.5  ml  Output 2850 ml  Net -960.5 ml   Filed Weights   10/06/17 0521 10/07/17 0224 10/08/17 0247  Weight: 122.6 kg (270 lb 4.5 oz) 122.5 kg (270 lb 1 oz) 125.9 kg (277 lb 9 oz)    Examination:  General exam: Appears calm and comfortable not in any distress. Off oxygen.  Respiratory system: clear no wheezing orr honchi.  Cardiovascular system: S1 & S2 heard, RRR. No JVD, murmurs, . No pedal edema. Gastrointestinal system: Abdomen is non distended, soft, tender generalized. .Normal bowel sounds heard.  Rectal tube in place.  Central nervous system: Alert and oriented. Non focal. Extremities: AKA RIGHT LE. Skin: unstageable sacral  ulcer.  Psychiatry:. Mood & affect appropriate.     Data Reviewed: I have personally reviewed following labs and imaging studies  CBC: Recent Labs  Lab 10/03/17 0618 10/04/17 0420 10/05/17 0314 10/06/17 1146 10/07/17 0454  WBC 13.5* 19.1* 23.5* 17.1* 14.9*  NEUTROABS  --   --   --  13.8*  --   HGB 7.7* 8.2* 8.6* 8.3* 8.3*  HCT 25.3* 27.1* 28.3* 27.3* 27.4*  MCV 75.5* 75.5* 75.3* 75.8* 76.3*  PLT 292 375 417* 355 683   Basic Metabolic Panel: Recent Labs  Lab 10/02/17 0559 10/02/17 1555 10/03/17 0618 10/04/17 0420 10/05/17 0314 10/06/17 1146 10/07/17 0454  NA 144 139 141 143 139 141 142  K 3.0* 2.7* 3.4* 3.0* 3.4* 3.1* 3.2*  CL 115* 112* 114* 113* 109 110 111  CO2 19* 19* 19* 23 21* 21* 24  GLUCOSE 150* 141* 116* 64* 193* 206* 159*  BUN 27* 26* 22* _0 CREATININE 0.94 0.94 0.81 0.76 0.77 0.73 0.76  CALCIUM 6.9* 6.8* 6.9* 7.1* 7.1* 7.0* 6.9*  MG 1.9 1.7 1.9 2.1 1.8  --   --   PHOS 2.2*  --  2.4* 2.8  --   --   --    GFR: Estimated Creatinine Clearance: 126.2 mL/min (by C-G formula based on SCr of 0.76 mg/dL). Liver Function Tests: No results for input(s): AST, ALT, ALKPHOS, BILITOT, PROT, ALBUMIN in the last 168 hours. No results for input(s): LIPASE, AMYLASE in the last 168 hours. No results for input(s): AMMONIA in the last 168 hours. Coagulation Profile: No results for input(s): INR, PROTIME in the last 168 hours. Cardiac Enzymes: No results for input(s): CKTOTAL, CKMB, CKMBINDEX, TROPONINI in the last 168 hours. BNP (last 3 results) No results for input(s): PROBNP in the last 8760 hours. HbA1C: No results for input(s): HGBA1C in the last 72 hours. CBG: Recent Labs  Lab 10/07/17 1750 10/07/17 2238 10/08/17 0805 10/08/17 1140 10/08/17 1630  GLUCAP 126* 66 75 119* 145*   Lipid Profile: No results for input(s):  CHOL, HDL, LDLCALC, TRIG, CHOLHDL, LDLDIRECT in the last 72 hours. Thyroid Function Tests: No results for input(s): TSH, T4TOTAL, FREET4, T3FREE, THYROIDAB in the last 72 hours. Anemia Panel: No results for input(s): VITAMINB12, FOLATE, FERRITIN, TIBC, IRON, RETICCTPCT in the last 72 hours. Sepsis Labs: No results for input(s): PROCALCITON, LATICACIDVEN in the last 168 hours.  Recent Results (from the past 240 hour(s))  Blood Culture (routine x 2)     Status: Abnormal (Preliminary result)   Collection Time: 09/29/17  4:34 AM  Result Value Ref Range Status   Specimen Description   Final    BLOOD CENTRAL LINE Performed at [facility], [place] [address], [place], [place] [address]    Special Requests   Final  BOTTLES DRAWN AEROBIC AND ANAEROBIC Blood Culture adequate volume Performed at [place] [address] [place], [place] [address]    Culture  Setup Time (A)  Final    GRAM VARIABLE ROD ANAEROBIC BOTTLE ONLY CRITICAL RESULT CALLED TO, READ BACK BY AND VERIFIED WITH: T. GREEN, RPHARMD (WL) AT 1513 ON 09/30/17 BY C. JESSUP, MLT.    Culture (A)  Final    GRAM VARIABLE ROD HOLDING FOR POSSIBLE ANAEROBE Performed at [facility], [place] [address] [place], [place] [address]    Report Status PENDING  Incomplete  Blood Culture ID Panel (Reflexed)     Status: None   Collection Time: 09/29/17  4:34 AM  Result Value Ref Range Status   Enterococcus species NOT DETECTED NOT DETECTED Final   Listeria monocytogenes NOT DETECTED NOT DETECTED Final   Staphylococcus species NOT DETECTED NOT DETECTED Final   Staphylococcus aureus NOT DETECTED NOT DETECTED Final   Streptococcus species NOT DETECTED NOT DETECTED Final   Streptococcus agalactiae NOT DETECTED NOT DETECTED Final   Streptococcus pneumoniae NOT DETECTED NOT DETECTED Final   Streptococcus pyogenes NOT DETECTED NOT DETECTED Final   Acinetobacter baumannii NOT DETECTED NOT DETECTED Final     Enterobacteriaceae species NOT DETECTED NOT DETECTED Final   Enterobacter cloacae complex NOT DETECTED NOT DETECTED Final   Escherichia coli NOT DETECTED NOT DETECTED Final   Klebsiella oxytoca NOT DETECTED NOT DETECTED Final   Klebsiella pneumoniae NOT DETECTED NOT DETECTED Final   Proteus species NOT DETECTED NOT DETECTED Final   Serratia marcescens NOT DETECTED NOT DETECTED Final   Haemophilus influenzae NOT DETECTED NOT DETECTED Final   Neisseria meningitidis NOT DETECTED NOT DETECTED Final   Pseudomonas aeruginosa NOT DETECTED NOT DETECTED Final   Candida albicans NOT DETECTED NOT DETECTED Final   Candida glabrata NOT DETECTED NOT DETECTED Final   Candida krusei NOT DETECTED NOT DETECTED Final   Candida parapsilosis NOT DETECTED NOT DETECTED Final   Candida tropicalis NOT DETECTED NOT DETECTED Final    Comment: Performed at [facility], [place] [address] [place], [place] [address]  Blood Culture (routine x 2)     Status: None   Collection Time: 09/29/17  4:41 AM  Result Value Ref Range Status   Specimen Description   Final    BLOOD LEFT ANTECUBITAL Performed at [place] [address] [place], [place] [address]    Special Requests   Final    BOTTLES DRAWN AEROBIC AND ANAEROBIC Blood Culture adequate volume Performed at [place] [address] [place], [place] [address]    Culture   Final    NO GROWTH 5 DAYS Performed at [facility], [place] [address] [place], [place] [address]    Report Status 10/04/2017 FINAL  Final  Urine culture     Status: Abnormal   Collection Time: 09/29/17  5:13 AM  Result Value Ref Range Status   Specimen Description   Final    URINE, CATHETERIZED Performed at [place] [address] [place], [place] [address]    Special Requests   Final    NONE Performed at [facility], [place] [address], [place], [place] [address]    Culture (A)  Final     70,000 COLONIES/mL VANCOMYCIN RESISTANT ENTEROCOCCUS ISOLATED 30,000 COLONIES/mL ACINETOBACTER CALCOACETICUS/BAUMANNII COMPLEX    Report Status 10/03/2017 FINAL  Final   Organism ID, Bacteria VANCOMYCIN RESISTANT ENTEROCOCCUS ISOLATED (A)  Final  Organism ID, Bacteria ACINETOBACTER CALCOACETICUS/BAUMANNII COMPLEX (A)  Final      Susceptibility   Acinetobacter calcoaceticus/baumannii complex - MIC*    CEFTAZIDIME 16 INTERMEDIATE Intermediate     CEFTRIAXONE 16 INTERMEDIATE Intermediate     CIPROFLOXACIN >=4 RESISTANT Resistant     GENTAMICIN 4 SENSITIVE Sensitive     IMIPENEM >=16 RESISTANT Resistant     PIP/TAZO >=128 RESISTANT Resistant     TRIMETH/SULFA >=320 RESISTANT Resistant     CEFEPIME 8 SENSITIVE Sensitive     AMPICILLIN/SULBACTAM 4 SENSITIVE Sensitive     * 30,000 COLONIES/mL ACINETOBACTER CALCOACETICUS/BAUMANNII COMPLEX   Vancomycin resistant enterococcus isolated - MIC*    AMPICILLIN <=2 SENSITIVE Sensitive     LEVOFLOXACIN >=8 RESISTANT Resistant     NITROFURANTOIN <=16 SENSITIVE Sensitive     VANCOMYCIN >=32 RESISTANT Resistant     LINEZOLID 2 SENSITIVE Sensitive     * 70,000 COLONIES/mL VANCOMYCIN RESISTANT ENTEROCOCCUS ISOLATED  MRSA PCR Screening     Status: None   Collection Time: 09/29/17  7:20 AM  Result Value Ref Range Status   MRSA by PCR NEGATIVE NEGATIVE Final    Comment:        The GeneXpert MRSA Assay (FDA approved for NASAL specimens only), is one component of a comprehensive MRSA colonization surveillance program. It is not intended to diagnose MRSA infection nor to guide or monitor treatment for MRSA infections. Performed at [facility], [place] [address], [place],  [place] [address]   C difficile quick scan w PCR reflex     Status: Abnormal   Collection Time: 09/30/17  1:30 PM  Result Value Ref Range Status   C Diff antigen POSITIVE (A) NEGATIVE Final   C Diff toxin NEGATIVE NEGATIVE Final   C Diff interpretation Results are  indeterminate. See PCR results.  Final    Comment: Performed at [facility], [place] [address] [place], [place] [address]  C. Diff by PCR, Reflexed     Status: Abnormal   Collection Time: 09/30/17  1:30 PM  Result Value Ref Range Status   Toxigenic C. Difficile by PCR POSITIVE (A) NEGATIVE Final    Comment: Positive for toxigenic C. difficile with little to no toxin production. Only treat if clinical presentation suggests symptomatic illness. Performed at [facility], [place] [address] [place], [place] [address]          Radiology Studies: No results found.      Scheduled Meds: . aspirin EC  81 mg Oral Daily  . atorvastatin  80 mg Oral q1800  . chlorhexidine gluconate (MEDLINE KIT)  15 mL Mouth Rinse BID  . collagenase   Topical Daily  . famotidine  20 mg Oral QHS  . ferrous sulfate  325 mg Oral BID WC  . gabapentin  600 mg Oral BID  . Gerhardt's butt cream   Topical TID  . hydrocerin   Topical BID  . insulin aspart  0-20 Units Subcutaneous TID WC  . insulin aspart  0-5 Units Subcutaneous QHS  . protein supplement shake  11 oz Oral BID BM  . rivaroxaban  20 mg Oral Q supper  . vancomycin  125 mg Oral QID  . vitamin C  250 mg Oral BID   Continuous Infusions:    LOS: 9 days    Time spent: 35 minutes    Hosie Poisson, MD [name] Pager [phone]  If 7PM-7AM, please contact night-coverage [email or web address] Password [facility] 10/08/2017, 7:43 PM

## 2017-10-08 NOTE — Progress Notes (Signed)
PHARMACY NOTE -  Xarelto  Pharmacy has been assisting with dosing of Xarelto for Afib. Dosage remains stable at 20 mg PO daily and need for further dosage adjustment appears unlikely at present. CBC and SCr also noted to be stable.  Will sign off at this time.  Please reconsult if a change in clinical status warrants re-evaluation of dosage.  Bernadene Person, PharmD, BCPS [phone] 10/08/2017, 11:33 AM

## 2017-10-08 NOTE — Plan of Care (Signed)
  Problem: Nutrition: Goal: Adequate nutrition will be maintained Outcome: Progressing   Problem: Elimination: Goal: Will not experience complications related to bowel motility Outcome: Progressing Goal: Will not experience complications related to urinary retention Outcome: Progressing   Problem: Pain Managment: Goal: General experience of comfort will improve Outcome: Progressing   Problem: Skin Integrity: Goal: Risk for impaired skin integrity will decrease Outcome: Progressing   Problem: Respiratory: Goal: Ability to maintain a clear airway and adequate ventilation will improve Outcome: Progressing   Problem: Activity: Goal: Capacity to carry out activities will improve Outcome: Progressing

## 2017-10-09 DIAGNOSIS — D649 Anemia, unspecified: Secondary | ICD-10-CM

## 2017-10-09 LAB — GLUCOSE, CAPILLARY
GLUCOSE-CAPILLARY: 170 mg/dL — AB (ref 65–99)
Glucose-Capillary: 155 mg/dL — ABNORMAL HIGH (ref 65–99)
Glucose-Capillary: 196 mg/dL — ABNORMAL HIGH (ref 65–99)
Glucose-Capillary: 213 mg/dL — ABNORMAL HIGH (ref 65–99)
Glucose-Capillary: 149 mg/dL — ABNORMAL HIGH (ref 65–99)

## 2017-10-09 LAB — CBC WITH DIFFERENTIAL/PLATELET
BASOS PCT: 0 %
Basophils Absolute: 0 10*3/uL (ref 0.0–0.1)
EOS PCT: 2 %
Eosinophils Absolute: 0.3 10*3/uL (ref 0.0–0.7)
HCT: 23.7 % — ABNORMAL LOW (ref 39.0–52.0)
HEMOGLOBIN: 7.5 g/dL — AB (ref 13.0–17.0)
LYMPHS PCT: 17 %
Lymphs Abs: 2.7 10*3/uL (ref 0.7–4.0)
MCH: 24.4 pg — ABNORMAL LOW (ref 26.0–34.0)
MCHC: 31.6 g/dL (ref 30.0–36.0)
MCV: 76.9 fL — AB (ref 78.0–100.0)
MONOS PCT: 6 %
Monocytes Absolute: 0.9 10*3/uL (ref 0.1–1.0)
NEUTROS PCT: 75 %
Neutro Abs: 11.9 10*3/uL — ABNORMAL HIGH (ref 1.7–7.7)
Platelets: 299 10*3/uL (ref 150–400)
RBC: 3.08 MIL/uL — ABNORMAL LOW (ref 4.22–5.81)
RDW: 23.7 % — ABNORMAL HIGH (ref 11.5–15.5)
WBC: 15.8 10*3/uL — ABNORMAL HIGH (ref 4.0–10.5)

## 2017-10-09 LAB — BASIC METABOLIC PANEL
ANION GAP: 7 (ref 5–15)
BUN: 7 mg/dL (ref 6–20)
CHLORIDE: 110 mmol/L (ref 101–111)
CO2: 24 mmol/L (ref 22–32)
Calcium: 6.6 mg/dL — ABNORMAL LOW (ref 8.9–10.3)
Creatinine, Ser: 0.64 mg/dL (ref 0.61–1.24)
GFR calc Af Amer: >60 mL/min (ref >60)
GFR calc non Af Amer: >60 mL/min (ref >60)
Glucose, Bld: 169 mg/dL — ABNORMAL HIGH (ref 65–99)
Potassium: 2.6 mmol/L — CL (ref 3.5–5.1)
Sodium: 141 mmol/L (ref 135–145)

## 2017-10-09 MED ORDER — POTASSIUM CHLORIDE CRYS ER 20 MEQ PO TBCR
40.0000 meq | EXTENDED_RELEASE_TABLET | Freq: Once | ORAL | Status: AC
  Administered 2017-10-09: 40 meq via ORAL
  Filled 2017-10-09: qty 2

## 2017-10-09 MED ORDER — POTASSIUM CHLORIDE 10 MEQ/100ML IV SOLN
10.0000 meq | INTRAVENOUS | Status: AC
  Administered 2017-10-09 (×3): 10 meq via INTRAVENOUS
  Filled 2017-10-09 (×3): qty 100

## 2017-10-09 MED ORDER — CALCIUM CARBONATE ANTACID 500 MG PO CHEW
2.0000 | CHEWABLE_TABLET | Freq: Two times a day (BID) | ORAL | Status: DC | PRN
  Administered 2017-10-09: 400 mg via ORAL
  Filled 2017-10-09: qty 2

## 2017-10-09 NOTE — Progress Notes (Signed)
CRITICAL VALUE ALERT  Critical Value:  Potassium 2.6  Date & Time Notied:  10/09/2017 1924  Provider Notified: Rana Snare NP  Orders Received/Actions taken: awaiting orders

## 2017-10-09 NOTE — Progress Notes (Signed)
PROGRESS NOTE    LESEAN WOOLVERTON  WLN:[id number] DOB: 1950/12/06 DOA: 09/29/2017 PCP: [name], MD (Inactive)    Brief Narrative:   67 yo male former smoker from [facility] with vomiting, altered mental status, hypoxia. PMHx of CAD, diastolic CHF, CKD 3, DM, HLD, HTN, OSA, Neuropathy, GI bleeding, A fib, PAD s/p Rt AKA, ESBL E coli bacteremia.  Intubated in ER and started on pressors for septic shock with concern for aspiration pneumonia.  Found to have severe hypokalemia.  Found to also have C diff.  He was stabilized in the ICU.  Extubated.  Transferred to stepdown unit.  Assessment & Plan:   Active Problems:   Septic shock (HCC)   Septic shock secondary to aspiration pneumonitis, urinary tract infection and  C diff colitis. Was on IV pressors in ICU currently off pressors and on stress dose steroids. Discontinued steroids.  Sepsis improving.  Afebrile, leukocytosis improving.  Repeat CBC in am.  No new complaints.     Acute hypoxic respiratory failure Secondary to aspiration pneumonitis. Patient was intubated and extubated on 3/24. Weaned him off the oxygen.  D/c telemetry.     History of obstructive sleep apnea requiring CPAP at night   Urinary tract infection showed [place] urine culture showed Acinetobacter as well as VRE.   Completed 5 days of Unasyn.  D.c IV antibiotoics.    C. difficile colitis currently on oral vancomycin to complete the course.  Stool output is not worse today, but more formed,   PVCs/nonsustained VT Echocardiogram showed normal systolic function monitor on telemetry.  Asymptomatic.    Acute renal failure improved   Hypokalemia replaced, repeat bmp tonight .    Hypocalcemia: corrected calcium level is between 8 to 9.  Continue to monitor. Asymptomatic.   History of coronary artery disease/chronic diastolic heart failure Patient currently denies any chest pain or shortness of breath.  He appears euvolemic.   Atrial  fibrillation rate controlled on Lovenox for anti coagulation. Changed to xarelto as he is taking by mouth. Rate well controlled. D/c tele.   Anemia of chronic disease hemoglobin stable   Type 2 diabetes mellitus  Better controlled.  Continue with SSI.  Recent Labs    10/09/17 0741 10/09/17 1142 10/09/17 1634  GLUCAP 196* 213* 149*   No change in meds   DVT prophylaxis: XARELTO Code Status: FULL CODE Family Communication: none at bedside Disposition Plan: discharge SNF When diarrhea improves.   Consultants:   None   STUDIES:  Renal u/s 3/21 >> normal kidneys Echo 3/22 >> mild LVH, EF 55 to 60%  CULTURES: Blood 3/21 >> Urine 3/21 >> Enterococcus, Acinetobacter >>  C diff PCR 3/22 >> Ag positive, toxin negative, PCR positive  ANTIBIOTICS: Vancomycin 3/21 >> 3/22 Meropenem 3/21 >> 3/25 Enteral vancomycin 3/23 >>   Unasyn 3/25>>  SIGNIFICANT EVENTS: 3/21 Admit 3/24 Off pressors, extubated  LINES/TUBES: ETT 3/21 >> 3/24   Subjective: Requiring both IV dilaudid and oral oxy for back pain.   Objective: Vitals:   10/08/17 2118 10/09/17 0348 10/09/17 0500 10/09/17 1340  BP: (!) 117/57 (!) 104/51  125/69  Pulse: 96 95  94  Resp: 20   18  Temp: 97.8 F (36.6 C) 97.9 F (36.6 C)  (!) 97.4 F (36.3 C)  TempSrc: Oral Oral  Oral  SpO2: 100% 97%  100%  Weight:   125.5 kg (276 lb 10.8 oz)   Height:        Intake/Output Summary (Last  24 hours) at 10/09/2017 1815 Last data filed at 10/09/2017 1715 Gross per 24 hour  Intake 1077 ml  Output 3350 ml  Net -2273 ml   Filed Weights   10/07/17 0224 10/08/17 0247 10/09/17 0500  Weight: 122.5 kg (270 lb 1 oz) 125.9 kg (277 lb 9 oz) 125.5 kg (276 lb 10.8 oz)    Examination:  General exam:in good spirits.  Respiratory system: good air entry bilateral.  Cardiovascular system: S1 & S2 heard, RRR. No JVD, murmurs, . No pedal edema. Gastrointestinal system: Abdomen is soft NT ND BS+,  .Normal bowel sounds heard.  Rectal tube in place.  Central nervous system: Alert and oriented. Non focal. Extremities: AKA RIGHT LE. Skin: unstageable sacral  ulcer.  Psychiatry:. Mood & affect appropriate.     Data Reviewed: I have personally reviewed following labs and imaging studies  CBC: Recent Labs  Lab 10/03/17 0618 10/04/17 0420 10/05/17 0314 10/06/17 1146 10/07/17 0454  WBC 13.5* 19.1* 23.5* 17.1* 14.9*  NEUTROABS  --   --   --  13.8*  --   HGB 7.7* 8.2* 8.6* 8.3* 8.3*  HCT 25.3* 27.1* 28.3* 27.3* 27.4*  MCV 75.5* 75.5* 75.3* 75.8* 76.3*  PLT 292 375 417* 355 191   Basic Metabolic Panel: Recent Labs  Lab 10/03/17 0618 10/04/17 0420 10/05/17 0314 10/06/17 1146 10/07/17 0454  NA 141 143 139 141 142  K 3.4* 3.0* 3.4* 3.1* 3.2*  CL 114* 113* 109 110 111  CO2 19* 23 21* 21* 24  GLUCOSE 116* 64* 193* 206* 159*  BUN 22* 16 12 8 8   CREATININE 0.81 0.76 0.77 0.73 0.76  CALCIUM 6.9* 7.1* 7.1* 7.0* 6.9*  MG 1.9 2.1 1.8  --   --   PHOS 2.4* 2.8  --   --   --    GFR: Estimated Creatinine Clearance: 126 mL/min (by C-G formula based on SCr of 0.76 mg/dL). Liver Function Tests: No results for input(s): AST, ALT, ALKPHOS, BILITOT, PROT, ALBUMIN in the last 168 hours. No results for input(s): LIPASE, AMYLASE in the last 168 hours. No results for input(s): AMMONIA in the last 168 hours. Coagulation Profile: No results for input(s): INR, PROTIME in the last 168 hours. Cardiac Enzymes: No results for input(s): CKTOTAL, CKMB, CKMBINDEX, TROPONINI in the last 168 hours. BNP (last 3 results) No results for input(s): PROBNP in the last 8760 hours. HbA1C: No results for input(s): HGBA1C in the last 72 hours. CBG: Recent Labs  Lab 10/08/17 1630 10/08/17 2116 10/09/17 0741 10/09/17 1142 10/09/17 1634  GLUCAP 145* 155* 196* 213* 149*   Lipid Profile: No results for input(s): CHOL, HDL, LDLCALC, TRIG, CHOLHDL, LDLDIRECT in the last 72 hours. Thyroid Function Tests: No results for input(s): TSH,  T4TOTAL, FREET4, T3FREE, THYROIDAB in the last 72 hours. Anemia Panel: No results for input(s): VITAMINB12, FOLATE, FERRITIN, TIBC, IRON, RETICCTPCT in the last 72 hours. Sepsis Labs: No results for input(s): PROCALCITON, LATICACIDVEN in the last 168 hours.  Recent Results (from the past 240 hour(s))  C difficile quick scan w PCR reflex     Status: Abnormal   Collection Time: 09/30/17  1:30 PM  Result Value Ref Range Status   C Diff antigen POSITIVE (A) NEGATIVE Final   C Diff toxin NEGATIVE NEGATIVE Final   C Diff interpretation Results are indeterminate. See PCR results.  Final    Comment: Performed at [facility], [place] [address], [place], [place] [address]  C. Diff by PCR, Reflexed  Status: Abnormal   Collection Time: 09/30/17  1:30 PM  Result Value Ref Range Status   Toxigenic C. Difficile by PCR POSITIVE (A) NEGATIVE Final    Comment: Positive for toxigenic C. difficile with little to no toxin production. Only treat if clinical presentation suggests symptomatic illness. Performed at [facility], [place] [address] [place], [place] [address]          Radiology Studies: No results found.      Scheduled Meds: . aspirin EC  81 mg Oral Daily  . atorvastatin  80 mg Oral q1800  . chlorhexidine gluconate (MEDLINE KIT)  15 mL Mouth Rinse BID  . collagenase   Topical Daily  . famotidine  20 mg Oral QHS  . ferrous sulfate  325 mg Oral BID WC  . gabapentin  600 mg Oral BID  . Gerhardt's butt cream   Topical TID  . hydrocerin   Topical BID  . insulin aspart  0-20 Units Subcutaneous TID WC  . insulin aspart  0-5 Units Subcutaneous QHS  . protein supplement shake  11 oz Oral BID BM  . rivaroxaban  20 mg Oral Q supper  . vancomycin  125 mg Oral QID  . vitamin C  250 mg Oral BID   Continuous Infusions:    LOS: 10 days    Time spent: 35 minutes    Hosie Poisson, MD [name] Pager [phone]  If 7PM-7AM, please contact  night-coverage [email or web address] Password [facility] 10/09/2017, 6:15 PM

## 2017-10-10 LAB — GLUCOSE, CAPILLARY
GLUCOSE-CAPILLARY: 166 mg/dL — AB (ref 65–99)
Glucose-Capillary: 215 mg/dL — ABNORMAL HIGH (ref 65–99)
Glucose-Capillary: 192 mg/dL — ABNORMAL HIGH (ref 65–99)
Glucose-Capillary: 248 mg/dL — ABNORMAL HIGH (ref 65–99)

## 2017-10-10 LAB — CBC
HEMATOCRIT: 25.6 % — AB (ref 39.0–52.0)
HEMOGLOBIN: 7.8 g/dL — AB (ref 13.0–17.0)
MCH: 23.6 pg — ABNORMAL LOW (ref 26.0–34.0)
MCHC: 30.5 g/dL (ref 30.0–36.0)
MCV: 77.6 fL — ABNORMAL LOW (ref 78.0–100.0)
Platelets: 317 10*3/uL (ref 150–400)
RBC: 3.3 MIL/uL — ABNORMAL LOW (ref 4.22–5.81)
RDW: 23.7 % — AB (ref 11.5–15.5)
WBC: 15.6 10*3/uL — AB (ref 4.0–10.5)

## 2017-10-10 LAB — POTASSIUM: POTASSIUM: 3.1 mmol/L — AB (ref 3.5–5.1)

## 2017-10-10 LAB — MAGNESIUM: Magnesium: 1.2 mg/dL — ABNORMAL LOW (ref 1.7–2.4)

## 2017-10-10 LAB — PREPARE RBC (CROSSMATCH)

## 2017-10-10 MED ORDER — SODIUM CHLORIDE 0.9 % IV SOLN
Freq: Once | INTRAVENOUS | Status: AC
  Administered 2017-10-10: 15:00:00 via INTRAVENOUS

## 2017-10-10 MED ORDER — POTASSIUM CHLORIDE CRYS ER 20 MEQ PO TBCR
40.0000 meq | EXTENDED_RELEASE_TABLET | Freq: Two times a day (BID) | ORAL | Status: AC
  Administered 2017-10-10 (×2): 40 meq via ORAL
  Filled 2017-10-10 (×2): qty 2

## 2017-10-10 MED ORDER — DEXTROSE 5 % IV SOLN
3.0000 g | Freq: Once | INTRAVENOUS | Status: AC
  Administered 2017-10-10: 3 g via INTRAVENOUS
  Filled 2017-10-10 (×2): qty 6

## 2017-10-10 MED ORDER — PREMIER PROTEIN SHAKE
11.0000 [oz_av] | ORAL | Status: DC
  Administered 2017-10-14: 11 [oz_av] via ORAL
  Filled 2017-10-10 (×4): qty 325.31

## 2017-10-10 NOTE — Progress Notes (Signed)
Nutrition Follow-up  DOCUMENTATION CODES:   Obesity unspecified  INTERVENTION:  - Will decrease Premier Protein to once/day, this supplement provides 160 kcal, 5 grams of carb, and 30 grams of protein. - Continue to encourage PO intakes.   NUTRITION DIAGNOSIS:   Increased nutrient needs related to acute illness, wound healing as evidenced by estimated needs. -ongoing  GOAL:   Patient will meet greater than or equal to 90% of their needs -minimally met on average  MONITOR:   PO intake, Supplement acceptance, Weight trends, Labs, Skin  ASSESSMENT:   67 yo male with PMH of DM, morbid obesity, Afib, OSA, anemia, HTN, HLD, GIB, CHF, CAD, R-AKA, and recent ESBL Ecoli Bacteremia. He presented to the ED from nursing home. EMS was called for N/V and when they arrived they found patient hypoxic and minimally responsive. He was emergently intubated in the ED and found to be in septic shock requiring vasopressors.  Weight continues to trend up with current weight being +25 lbs/8.4 kg compared to admission weight. Pt with C. Diff and plan is to d/c to SNF when diarrhea has improved. Pt has been eating mainly 50-100% of meals since last RD assessment (3/25). Pt has mainly been refusing Premier Protein, which is currently ordered BID.    Medications reviewed; 20 mg oral Pepcid/day, 325 mg gerrous sulfate BID, sliding scale, 10 mEq IV KCl x3 runs yesterday, 40 mEq oral KCl x1 dose yesterday, 250 mg ascorbic acid BID. Labs reviewed; CBG: 215 mg/dL this AM, K: 2.6 mmol/L, Ca: 6.6 mg/dL.   Diet Order:  Diet Carb Modified Fluid consistency: Thin; Room service appropriate? 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  Estimated Nutritional Needs:   Kcal:  2100-2330 (18-20 kcal/kg)  Protein:  130-140 grams  Fluid:  >/= 2.1 L/day     Jarome Matin, MS, RD, LDN, [facility] Inpatient Clinical Dietitian Pager # [phone] After hours/weekend pager # [phone]

## 2017-10-10 NOTE — Progress Notes (Signed)
PROGRESS NOTE    [name]  QBH:[id number] DOB: 10-20-1950 DOA: 09/29/2017 PCP: [name], MD (Inactive)    Brief Narrative:   67 yo male former smoker from [facility] with vomiting, altered mental status, hypoxia. PMHx of CAD, diastolic CHF, CKD 3, DM, HLD, HTN, OSA, Neuropathy, GI bleeding, A fib, PAD s/p Rt AKA, ESBL E coli bacteremia.  Intubated in ER and started on pressors for septic shock with concern for aspiration pneumonia.  Found to have severe hypokalemia.  Found to also have C diff.  He was stabilized in the ICU.  Extubated.  Transferred to stepdown unit. Currently he is on [facility] service on the floor.   Assessment & Plan:   Active Problems:   Septic shock (HCC)   Septic shock secondary to aspiration pneumonitis, urinary tract infection and  C diff colitis. Was on IV pressors in ICU currently off pressors and on stress dose steroids. Discontinued steroids.  Sepsis improving.  Afebrile, leukocytosis improving.  Repeat CBC in am shows wbc count of 15,600. No new complaints.     Acute hypoxic respiratory failure Secondary to aspiration pneumonitis. Patient was intubated and extubated on 3/24. Weaned him off the oxygen.  D/c telemetry.  Completed antibiotics.     History of obstructive sleep apnea requiring CPAP at night   Urinary tract infection showed [place] urine culture showed Acinetobacter as well as VRE.   Completed 5 days of Unasyn.  D.c IV antibiotoics.    C. difficile colitis currently on oral vancomycin to complete the course.  Stool out put has improved, but not consistent enough to remove the FLEXI SEAL.    PVCs/nonsustained VT Echocardiogram showed normal systolic function monitor on telemetry.  Asymptomatic.    Acute renal failure improved, creatinine is at baseline.    Hypokalemia replaced, repeat bmp tonight .    Hypocalcemia: corrected calcium level is between 8 to 9.  Continue to monitor. Asymptomatic.   History of  coronary artery disease/chronic diastolic heart failure Patient currently denies any chest pain or shortness of breath.  He appears euvolemic.   Atrial fibrillation rate controlled on Lovenox for anti coagulation. Changed to xarelto as he is taking by mouth. Rate well controlled. D/c tele.   Anemia of chronic disease hemoglobin stable   Type 2 diabetes mellitus  Better controlled.  Continue with SSI.  Recent Labs    10/10/17 0733 10/10/17 1151 10/10/17 1724  GLUCAP 215* 192* 166*   No change in meds   DVT prophylaxis: XARELTO Code Status: FULL CODE Family Communication: none at bedside Disposition Plan: discharge SNF When diarrhea improves. When able to take the flexiseal off.   Consultants:   None   STUDIES:  Renal u/s 3/21 >> normal kidneys Echo 3/22 >> mild LVH, EF 55 to 60%  CULTURES: Blood 3/21 >> Urine 3/21 >> Enterococcus, Acinetobacter >>  C diff PCR 3/22 >> Ag positive, toxin negative, PCR positive  ANTIBIOTICS: Vancomycin 3/21 >> 3/22 Meropenem 3/21 >> 3/25 Enteral vancomycin 3/23 >>   Unasyn 3/25>>  SIGNIFICANT EVENTS: 3/21 Admit 3/24 Off pressors, extubated  LINES/TUBES: ETT 3/21 >> 3/24   Subjective: Reports he feels weak , is glad that diarrhea is improving.   Objective: Vitals:   10/10/17 1244 10/10/17 1446 10/10/17 1515 10/10/17 1755  BP: (!) 140/58 (!) 116/47 (!) 106/47 138/70  Pulse: 87 87 88 83  Resp: _0 Temp: 98.3 F (36.8 C) 97.7 F (36.5 C) 98.4 F (36.9 C)  98.2 F (36.8 C)  TempSrc: Oral Oral Oral Oral  SpO2: 100% 100% 100% 100%  Weight:      Height:        Intake/Output Summary (Last 24 hours) at 10/10/2017 1937 Last data filed at 10/10/2017 1755 Gross per 24 hour  Intake 1896 ml  Output 2825 ml  Net -929 ml   Filed Weights   10/07/17 0224 10/08/17 0247 10/09/17 0500  Weight: 122.5 kg (270 lb 1 oz) 125.9 kg (277 lb 9 oz) 125.5 kg (276 lb 10.8 oz)    Examination:  General exam:in good spirits.  Not in distress.  Respiratory system: good air entry bilateral. No wheezing or rhonchi.  Cardiovascular system: S1 & S2 heard, RRR. No JVD, murmurs, . No pedal edema. Gastrointestinal system: Abdomen is soft non distended, non tender, bowel sounds heards. flexiseal in place.  Central nervous system: Alert and oriented. Non focal. Extremities: AKA RIGHT LE. Skin: unstageable sacral  ulcer.  Psychiatry:. Mood & affect appropriate.     Data Reviewed: I have personally reviewed following labs and imaging studies  CBC: Recent Labs  Lab 10/05/17 0314 10/06/17 1146 10/07/17 0454 10/09/17 1834 10/10/17 0524  WBC 23.5* 17.1* 14.9* 15.8* 15.6*  NEUTROABS  --  13.8*  --  11.9*  --   HGB 8.6* 8.3* 8.3* 7.5* 7.8*  HCT 28.3* 27.3* 27.4* 23.7* 25.6*  MCV 75.3* 75.8* 76.3* 76.9* 77.6*  PLT 417* 355 350 299 716   Basic Metabolic Panel: Recent Labs  Lab 10/04/17 0420 10/05/17 0314 10/06/17 1146 10/07/17 0454 10/09/17 1834 10/10/17 0524  NA 143 139 141 142 141  --   K 3.0* 3.4* 3.1* 3.2* 2.6* 3.1*  CL 113* 109 110 111 110  --   CO2 23 21* 21* 24 24  --   GLUCOSE 64* 193* 206* 159* 169*  --   BUN _0 --   CREATININE 0.76 0.77 0.73 0.76 0.64  --   CALCIUM 7.1* 7.1* 7.0* 6.9* 6.6*  --   MG 2.1 1.8  --   --   --  1.2*  PHOS 2.8  --   --   --   --   --    GFR: Estimated Creatinine Clearance: 126 mL/min (by C-G formula based on SCr of 0.64 mg/dL). Liver Function Tests: No results for input(s): AST, ALT, ALKPHOS, BILITOT, PROT, ALBUMIN in the last 168 hours. No results for input(s): LIPASE, AMYLASE in the last 168 hours. No results for input(s): AMMONIA in the last 168 hours. Coagulation Profile: No results for input(s): INR, PROTIME in the last 168 hours. Cardiac Enzymes: No results for input(s): CKTOTAL, CKMB, CKMBINDEX, TROPONINI in the last 168 hours. BNP (last 3 results) No results for input(s): PROBNP in the last 8760 hours. HbA1C: No results for input(s): HGBA1C in  the last 72 hours. CBG: Recent Labs  Lab 10/09/17 1634 10/09/17 2035 10/10/17 0733 10/10/17 1151 10/10/17 1724  GLUCAP 149* 170* 215* 192* 166*   Lipid Profile: No results for input(s): CHOL, HDL, LDLCALC, TRIG, CHOLHDL, LDLDIRECT in the last 72 hours. Thyroid Function Tests: No results for input(s): TSH, T4TOTAL, FREET4, T3FREE, THYROIDAB in the last 72 hours. Anemia Panel: No results for input(s): VITAMINB12, FOLATE, FERRITIN, TIBC, IRON, RETICCTPCT in the last 72 hours. Sepsis Labs: No results for input(s): PROCALCITON, LATICACIDVEN in the last 168 hours.  No results found for this or any previous visit (from the past 240 hour(s)).  Radiology Studies: No results found.      Scheduled Meds: . aspirin EC  81 mg Oral Daily  . atorvastatin  80 mg Oral q1800  . chlorhexidine gluconate (MEDLINE KIT)  15 mL Mouth Rinse BID  . collagenase   Topical Daily  . famotidine  20 mg Oral QHS  . ferrous sulfate  325 mg Oral BID WC  . gabapentin  600 mg Oral BID  . Gerhardt's butt cream   Topical TID  . hydrocerin   Topical BID  . insulin aspart  0-20 Units Subcutaneous TID WC  . insulin aspart  0-5 Units Subcutaneous QHS  . potassium chloride  40 mEq Oral BID  . [START ON 10/11/2017] protein supplement shake  11 oz Oral Q24H  . rivaroxaban  20 mg Oral Q supper  . vancomycin  125 mg Oral QID  . vitamin C  250 mg Oral BID   Continuous Infusions:    LOS: 11 days    Time spent: 35 minutes    Hosie Poisson, MD [name] Pager [phone]  If 7PM-7AM, please contact night-coverage [email or web address] Password [facility] 10/10/2017, 7:37 PM

## 2017-10-10 NOTE — Plan of Care (Signed)
  Problem: Coping: Goal: Level of anxiety will decrease Outcome: Progressing   Problem: Elimination: Goal: Will not experience complications related to bowel motility Outcome: Progressing Goal: Will not experience complications related to urinary retention Outcome: Progressing   Problem: Skin Integrity: Goal: Risk for impaired skin integrity will decrease Outcome: Progressing   Problem: Respiratory: Goal: Ability to maintain a clear airway and adequate ventilation will improve Outcome: Progressing   Problem: Activity: Goal: Capacity to carry out activities will improve Outcome: Progressing   Problem: Cardiac: Goal: Ability to achieve and maintain adequate cardiopulmonary perfusion will improve Outcome: Progressing

## 2017-10-11 ENCOUNTER — Inpatient Hospital Stay (HOSPITAL_COMMUNITY): Payer: Medicare Other

## 2017-10-11 LAB — BPAM RBC
BLOOD PRODUCT EXPIRATION DATE: 201904262359
ISSUE DATE / TIME: 201904011453
UNIT TYPE AND RH: 600

## 2017-10-11 LAB — TYPE AND SCREEN
ABO/RH(D): A NEG
ANTIBODY SCREEN: NEGATIVE
Unit division: 0

## 2017-10-11 LAB — BASIC METABOLIC PANEL
ANION GAP: 8 (ref 5–15)
BUN: 7 mg/dL (ref 6–20)
CALCIUM: 7 mg/dL — AB (ref 8.9–10.3)
CO2: 24 mmol/L (ref 22–32)
Chloride: 107 mmol/L (ref 101–111)
Creatinine, Ser: 0.65 mg/dL (ref 0.61–1.24)
GFR calc Af Amer: >60 mL/min (ref >60)
GFR calc non Af Amer: >60 mL/min (ref >60)
GLUCOSE: 242 mg/dL — AB (ref 65–99)
Potassium: 4 mmol/L (ref 3.5–5.1)
Sodium: 139 mmol/L (ref 135–145)

## 2017-10-11 LAB — GLUCOSE, CAPILLARY
GLUCOSE-CAPILLARY: 251 mg/dL — AB (ref 65–99)
GLUCOSE-CAPILLARY: 145 mg/dL — AB (ref 65–99)
GLUCOSE-CAPILLARY: 240 mg/dL — AB (ref 65–99)
Glucose-Capillary: 213 mg/dL — ABNORMAL HIGH (ref 65–99)

## 2017-10-11 LAB — CULTURE, BLOOD (ROUTINE X 2): SPECIAL REQUESTS: ADEQUATE

## 2017-10-11 LAB — HEMOGLOBIN A1C
HEMOGLOBIN A1C: 6.1 % — AB (ref 4.8–5.6)
MEAN PLASMA GLUCOSE: 128.37 mg/dL

## 2017-10-11 LAB — MAGNESIUM: Magnesium: 1.6 mg/dL — ABNORMAL LOW (ref 1.7–2.4)

## 2017-10-11 MED ORDER — FUROSEMIDE 40 MG PO TABS
40.0000 mg | ORAL_TABLET | Freq: Every day | ORAL | Status: DC
  Administered 2017-10-11 – 2017-10-14 (×4): 40 mg via ORAL
  Filled 2017-10-11 (×4): qty 1

## 2017-10-11 MED ORDER — IOPAMIDOL (ISOVUE-300) INJECTION 61%
100.0000 mL | Freq: Once | INTRAVENOUS | Status: AC | PRN
  Administered 2017-10-11: 100 mL via INTRAVENOUS

## 2017-10-11 MED ORDER — IOPAMIDOL (ISOVUE-300) INJECTION 61%: 15.0000 mL | Freq: Once | INTRAVENOUS | Status: AC | PRN

## 2017-10-11 MED ORDER — IOPAMIDOL (ISOVUE-300) INJECTION 61%
INTRAVENOUS | Status: AC
Start: 2017-10-11 — End: 2017-10-12
  Filled 2017-10-11: qty 100

## 2017-10-11 MED ORDER — IOPAMIDOL (ISOVUE-300) INJECTION 61%
INTRAVENOUS | Status: AC
  Filled 2017-10-11: qty 30

## 2017-10-11 MED ORDER — INSULIN DETEMIR 100 UNIT/ML ~~LOC~~ SOLN
10.0000 [IU] | Freq: Every day | SUBCUTANEOUS | Status: DC
  Administered 2017-10-11 – 2017-10-14 (×4): 10 [IU] via SUBCUTANEOUS
  Filled 2017-10-11 (×4): qty 0.1

## 2017-10-11 MED ORDER — MAGNESIUM SULFATE 2 GM/50ML IV SOLN
2.0000 g | Freq: Once | INTRAVENOUS | Status: AC
  Administered 2017-10-11: 2 g via INTRAVENOUS
  Filled 2017-10-11: qty 50

## 2017-10-11 NOTE — Plan of Care (Signed)
  Problem: Education: Goal: Knowledge of General Education information will improve Outcome: Progressing   Problem: Health Behavior/Discharge Planning: Goal: Ability to manage health-related needs will improve Outcome: Progressing   Problem: Coping: Goal: Level of anxiety will decrease Outcome: Progressing   Problem: Skin Integrity: Goal: Risk for impaired skin integrity will decrease Outcome: Progressing   Problem: Respiratory: Goal: Ability to maintain a clear airway and adequate ventilation will improve Outcome: Progressing   Problem: Cardiac: Goal: Ability to achieve and maintain adequate cardiopulmonary perfusion will improve Outcome: Progressing

## 2017-10-11 NOTE — Progress Notes (Signed)
Inpatient Diabetes Program Recommendations  AACE/ADA: New Consensus Statement on Inpatient Glycemic Control (2015)  Target Ranges:  Prepandial:   less than 140 mg/dL      Peak postprandial:   less than 180 mg/dL (1-2 hours)      Critically ill patients:  140 - 180 mg/dL   Lab Results  Component Value Date   GLUCAP 213 (H) 10/11/2017   HGBA1C 9.6 (H) 04/11/2017    Review of Glycemic Control  Diabetes history: DM2 Outpatient Diabetes medications: Levemir 40 units QD, Novolog 2-10 units qid Current orders for Inpatient glycemic control: Novolog 0-20 units tidwc and hs  Need updated HgbA1C. Last one on 04/11/2017 - 9.6%  Inpatient Diabetes Program Recommendations:     Add Levemir 20 units QD Need updated HgbA1C  Continue to follow.   Thank you. Ailene Ards, RD, LDN, CDE Inpatient Diabetes Coordinator [phone]

## 2017-10-11 NOTE — Progress Notes (Signed)
PROGRESS NOTE    [name]  HCW:[id number] DOB: 13-Sep-1950 DOA: 09/29/2017 PCP: [name], MD (Inactive)    Brief Narrative:   67 yo male former smoker from [facility] with vomiting, altered mental status, hypoxia. PMHx of CAD, diastolic CHF, CKD 3, DM, HLD, HTN, OSA, Neuropathy, GI bleeding, A fib, PAD s/p Rt AKA, ESBL E coli bacteremia.  Intubated in ER and started on pressors for septic shock with concern for aspiration pneumonia.  Found to have severe hypokalemia.  Found to also have C diff.  He was stabilized in the ICU.  Extubated.  Transferred to stepdown unit. Currently he is on [facility] service on the floor.   Assessment & Plan:   Active Problems:   Septic shock (HCC)   Septic shock secondary to aspiration pneumonitis, urinary tract infection and  C diff colitis. Was on IV pressors in ICU currently off pressors and on stress dose steroids. Discontinued steroids as his sepsis is resolved.  Afebrile, leukocytosis improving.  BP remains stable.   H/o diastolic heart failure: chronic, restart the lasix as he has left leg edema and scrotal edema.   Acute hypoxic respiratory failure Secondary to aspiration pneumonitis. Patient was intubated and extubated on 3/24. Weaned him off the oxygen.  D/c telemetry.  Completed antibiotics.     History of obstructive sleep apnea requiring CPAP at night. Pt been refusing CPAP.    Urinary tract infection showed [place] urine culture showed Acinetobacter as well as VRE.   Completed 5 days of Unasyn.  D.c IV antibiotoics.    C. difficile colitis currently on oral vancomycin to complete the course.  Stool out put has improved, but not consistent enough to remove the [facility], as we have the risk to contaminating the sacral decubitus ulcer.  Abdominal pain , crampy, non radiating not associated with nausea, or vomiting, unclear etiology. Bowel sounds are good. Stool output hasn't changed much . Will get CT abd and pelvis for  further evaluation.    Blood culture one bottle showing clostridium species discussed with Dr Linus Salmons, suggested its probably contaminant.    PVCs/nonsustained VT Echocardiogram showed normal systolic function monitor on telemetry.  Asymptomatic.    Acute renal failure improved, creatinine is at baseline.    Hypokalemia replaced, repeat bmp tonight .    Hypocalcemia: corrected calcium level is between 8 to 9.  Continue to monitor. Asymptomatic.   History of coronary artery disease/chronic diastolic heart failure Patient currently denies any chest pain or shortness of breath.     Atrial fibrillation rate controlled , on xarelto for anti coagulation.    Anemia of chronic disease hemoglobin stable around 7.  Will ordered one unit of prbc transfusion.  Repeat cbc in am.    Hypomagnesemia:  Replaced.    Type 2 diabetes mellitus  Better controlled.  Continue with SSI.  Recent Labs    10/10/17 2052 10/11/17 0734 10/11/17 1211  GLUCAP 248* 213* 251*   Started him on 10 unit of levemir as he was getting hypoglycemic on 20 units of levemir earlier. As his cbgs are increasing, get a1C.    DVT prophylaxis: XARELTO Code Status: FULL CODE Family Communication: none at bedside Disposition Plan: discharge SNF When diarrhea improves. When able to take the flexiseal off.   Consultants:   None   STUDIES:  Renal u/s 3/21 >> normal kidneys Echo 3/22 >> mild LVH, EF 55 to 60%  CULTURES: Blood 3/21 >> Urine 3/21 >> Enterococcus, Acinetobacter >>  C diff PCR 3/22 >> Ag positive, toxin negative, PCR positive  ANTIBIOTICS: Vancomycin 3/21 >> 3/22 Meropenem 3/21 >> 3/25 Enteral vancomycin 3/23 >>   Unasyn 3/25>>  SIGNIFICANT EVENTS: 3/21 Admit 3/24 Off pressors, extubated  LINES/TUBES: ETT 3/21 >> 3/24   Subjective: Reports abdominal pain.   Objective: Vitals:   10/10/17 1755 10/10/17 2056 10/11/17 0423 10/11/17 1212  BP: 138/70 (!) 115/55 (!) 115/49 (!)  122/42  Pulse: 83 89 80 85  Resp: _0 Temp: 98.2 F (36.8 C) 97.6 F (36.4 C) 97.9 F (36.6 C) 98.7 F (37.1 C)  TempSrc: Oral Oral Oral Oral  SpO2: 100% 96% 100% 98%  Weight:   128.6 kg (283 lb 8.2 oz)   Height:        Intake/Output Summary (Last 24 hours) at 10/11/2017 1517 Last data filed at 10/11/2017 1100 Gross per 24 hour  Intake 1390 ml  Output 2300 ml  Net -910 ml   Filed Weights   10/08/17 0247 10/09/17 0500 10/11/17 0423  Weight: 125.9 kg (277 lb 9 oz) 125.5 kg (276 lb 10.8 oz) 128.6 kg (283 lb 8.2 oz)    Examination:  General exam: appears uncomfortable, though not in distress.  Respiratory system: diminished at bases, no wheezing or rhonchi.  Cardiovascular system: S1 & S2 heard, RRR. No JVD, murmurs, . No pedal edema. Gastrointestinal system: Abdomen is soft non distended, tender in the lower quadrant,  flexiseal in place with liquid green stool.  Central nervous system: Alert and oriented. Non focal. Extremities: AKA RIGHT LE. Skin: unstageable sacral  ulcer.  Psychiatry:. Mood & affect appropriate.     Data Reviewed: I have personally reviewed following labs and imaging studies  CBC: Recent Labs  Lab 10/05/17 0314 10/06/17 1146 10/07/17 0454 10/09/17 1834 10/10/17 0524  WBC 23.5* 17.1* 14.9* 15.8* 15.6*  NEUTROABS  --  13.8*  --  11.9*  --   HGB 8.6* 8.3* 8.3* 7.5* 7.8*  HCT 28.3* 27.3* 27.4* 23.7* 25.6*  MCV 75.3* 75.8* 76.3* 76.9* 77.6*  PLT 417* 355 350 299 010   Basic Metabolic Panel: Recent Labs  Lab 10/05/17 0314 10/06/17 1146 10/07/17 0454 10/09/17 1834 10/10/17 0524 10/11/17 0538  NA 139 141 142 141  --  139  K 3.4* 3.1* 3.2* 2.6* 3.1* 4.0  CL 109 110 111 110  --  107  CO2 21* 21* 24 24  --  24  GLUCOSE 193* 206* 159* 169*  --  242*  BUN _1 --  7  CREATININE 0.77 0.73 0.76 0.64  --  0.65  CALCIUM 7.1* 7.0* 6.9* 6.6*  --  7.0*  MG 1.8  --   --   --  1.2* 1.6*   GFR: Estimated Creatinine Clearance: 127.6  mL/min (by C-G formula based on SCr of 0.65 mg/dL). Liver Function Tests: No results for input(s): AST, ALT, ALKPHOS, BILITOT, PROT, ALBUMIN in the last 168 hours. No results for input(s): LIPASE, AMYLASE in the last 168 hours. No results for input(s): AMMONIA in the last 168 hours. Coagulation Profile: No results for input(s): INR, PROTIME in the last 168 hours. Cardiac Enzymes: No results for input(s): CKTOTAL, CKMB, CKMBINDEX, TROPONINI in the last 168 hours. BNP (last 3 results) No results for input(s): PROBNP in the last 8760 hours. HbA1C: No results for input(s): HGBA1C in the last 72 hours. CBG: Recent Labs  Lab 10/10/17 1151 10/10/17 1724 10/10/17 2052 10/11/17 0734 10/11/17 1211  GLUCAP 192*  166* 248* 213* 251*   Lipid Profile: No results for input(s): CHOL, HDL, LDLCALC, TRIG, CHOLHDL, LDLDIRECT in the last 72 hours. Thyroid Function Tests: No results for input(s): TSH, T4TOTAL, FREET4, T3FREE, THYROIDAB in the last 72 hours. Anemia Panel: No results for input(s): VITAMINB12, FOLATE, FERRITIN, TIBC, IRON, RETICCTPCT in the last 72 hours. Sepsis Labs: No results for input(s): PROCALCITON, LATICACIDVEN in the last 168 hours.  No results found for this or any previous visit (from the past 240 hour(s)).       Radiology Studies: No results found.      Scheduled Meds: . aspirin EC  81 mg Oral Daily  . atorvastatin  80 mg Oral q1800  . chlorhexidine gluconate (MEDLINE KIT)  15 mL Mouth Rinse BID  . collagenase   Topical Daily  . famotidine  20 mg Oral QHS  . ferrous sulfate  325 mg Oral BID WC  . furosemide  40 mg Oral Daily  . gabapentin  600 mg Oral BID  . Gerhardt's butt cream   Topical TID  . hydrocerin   Topical BID  . insulin aspart  0-20 Units Subcutaneous TID WC  . insulin aspart  0-5 Units Subcutaneous QHS  . iopamidol      . protein supplement shake  11 oz Oral Q24H  . rivaroxaban  20 mg Oral Q supper  . vitamin C  250 mg Oral BID    Continuous Infusions:    LOS: 12 days    Time spent: 35 minutes    Hosie Poisson, MD [name] Pager [phone]  If 7PM-7AM, please contact night-coverage [email or web address] Password [facility] 10/11/2017, 3:17 PM

## 2017-10-12 ENCOUNTER — Inpatient Hospital Stay (HOSPITAL_COMMUNITY): Payer: Medicare Other

## 2017-10-12 DIAGNOSIS — N183 Chronic kidney disease, stage 3 (moderate): Secondary | ICD-10-CM

## 2017-10-12 DIAGNOSIS — I5032 Chronic diastolic (congestive) heart failure: Secondary | ICD-10-CM

## 2017-10-12 DIAGNOSIS — Z89511 Acquired absence of right leg below knee: Secondary | ICD-10-CM

## 2017-10-12 DIAGNOSIS — I10 Essential (primary) hypertension: Secondary | ICD-10-CM

## 2017-10-12 DIAGNOSIS — N5089 Other specified disorders of the male genital organs: Secondary | ICD-10-CM

## 2017-10-12 DIAGNOSIS — A0472 Enterocolitis due to Clostridium difficile, not specified as recurrent: Secondary | ICD-10-CM | POA: Diagnosis present

## 2017-10-12 DIAGNOSIS — I482 Chronic atrial fibrillation: Secondary | ICD-10-CM

## 2017-10-12 LAB — CBC WITH DIFFERENTIAL/PLATELET
BASOS ABS: 0 10*3/uL (ref 0.0–0.1)
Basophils Relative: 0 %
Eosinophils Absolute: 0.3 10*3/uL (ref 0.0–0.7)
Eosinophils Relative: 2 %
HCT: 26.9 % — ABNORMAL LOW (ref 39.0–52.0)
Hemoglobin: 8.3 g/dL — ABNORMAL LOW (ref 13.0–17.0)
LYMPHS ABS: 2.2 10*3/uL (ref 0.7–4.0)
Lymphocytes Relative: 14 %
MCH: 24.2 pg — ABNORMAL LOW (ref 26.0–34.0)
MCHC: 30.9 g/dL (ref 30.0–36.0)
MCV: 78.4 fL (ref 78.0–100.0)
MONOS PCT: 9 %
Monocytes Absolute: 1.4 10*3/uL — ABNORMAL HIGH (ref 0.1–1.0)
NEUTROS PCT: 75 %
Neutro Abs: 11.6 10*3/uL — ABNORMAL HIGH (ref 1.7–7.7)
PLATELETS: 320 10*3/uL (ref 150–400)
RBC: 3.43 MIL/uL — AB (ref 4.22–5.81)
RDW: 22.8 % — AB (ref 11.5–15.5)
WBC: 15.5 10*3/uL — AB (ref 4.0–10.5)

## 2017-10-12 LAB — BASIC METABOLIC PANEL
ANION GAP: 7 (ref 5–15)
BUN: 7 mg/dL (ref 6–20)
CALCIUM: 7.1 mg/dL — AB (ref 8.9–10.3)
CO2: 27 mmol/L (ref 22–32)
Chloride: 106 mmol/L (ref 101–111)
Creatinine, Ser: 0.63 mg/dL (ref 0.61–1.24)
GFR calc Af Amer: >60 mL/min (ref >60)
GFR calc non Af Amer: >60 mL/min (ref >60)
Glucose, Bld: 147 mg/dL — ABNORMAL HIGH (ref 65–99)
Potassium: 3.2 mmol/L — ABNORMAL LOW (ref 3.5–5.1)
Sodium: 140 mmol/L (ref 135–145)

## 2017-10-12 LAB — GLUCOSE, CAPILLARY
GLUCOSE-CAPILLARY: 142 mg/dL — AB (ref 65–99)
Glucose-Capillary: 216 mg/dL — ABNORMAL HIGH (ref 65–99)
Glucose-Capillary: 149 mg/dL — ABNORMAL HIGH (ref 65–99)
Glucose-Capillary: 143 mg/dL — ABNORMAL HIGH (ref 65–99)

## 2017-10-12 LAB — MAGNESIUM: Magnesium: 1.6 mg/dL — ABNORMAL LOW (ref 1.7–2.4)

## 2017-10-12 MED ORDER — POTASSIUM CHLORIDE CRYS ER 20 MEQ PO TBCR
40.0000 meq | EXTENDED_RELEASE_TABLET | Freq: Two times a day (BID) | ORAL | Status: DC
  Administered 2017-10-12: 40 meq via ORAL
  Filled 2017-10-12: qty 2

## 2017-10-12 MED ORDER — DEXTROSE 5 % IV SOLN
3.0000 g | Freq: Once | INTRAVENOUS | Status: AC
  Administered 2017-10-12: 3 g via INTRAVENOUS
  Filled 2017-10-12: qty 6

## 2017-10-12 MED ORDER — MAGNESIUM OXIDE 400 (241.3 MG) MG PO TABS
400.0000 mg | ORAL_TABLET | Freq: Two times a day (BID) | ORAL | Status: DC
  Administered 2017-10-12 – 2017-10-14 (×6): 400 mg via ORAL
  Filled 2017-10-12 (×6): qty 1

## 2017-10-12 MED ORDER — INSULIN ASPART 100 UNIT/ML ~~LOC~~ SOLN
3.0000 [IU] | Freq: Three times a day (TID) | SUBCUTANEOUS | Status: DC
  Administered 2017-10-12 – 2017-10-14 (×7): 3 [IU] via SUBCUTANEOUS

## 2017-10-12 MED ORDER — POTASSIUM CHLORIDE CRYS ER 20 MEQ PO TBCR
40.0000 meq | EXTENDED_RELEASE_TABLET | Freq: Three times a day (TID) | ORAL | Status: DC
  Administered 2017-10-12 – 2017-10-14 (×8): 40 meq via ORAL
  Filled 2017-10-12 (×8): qty 2

## 2017-10-12 NOTE — Progress Notes (Signed)
RN, Nuclear Medicine, & Ultrasound consulted together to coordinate time for Nuclear Med HIDA scan and [place].  Due to the patient having breakfast and narcotic pain med around 0900, HIDA scan will be on hold until tomorrow (April 4th) morning.  Text page sent to Dr. Magnus Ivan with [facility], per Amion number.  Pt will be NPO after MN tonight. Maeola Harman

## 2017-10-12 NOTE — Care Management Important Message (Signed)
Important Message  Patient Details  Name: [name] MRN: [id number] Date of Birth: 23-Nov-1950   Medicare Important Message Given:  Yes    Caren Macadam 10/12/2017, 10:18 AMImportant Message  Patient Details  Name: [name] MRN: [id number] Date of Birth: Jan 01, 1951   Medicare Important Message Given:  Yes    Caren Macadam 10/12/2017, 10:17 AM

## 2017-10-12 NOTE — Progress Notes (Signed)
[facility] is aware of this patient.  He needs further evaluation of his gallbladder with an [place] to evaluate his anatomy as well as a HIDA scan to definitively rule in or out cholecystitis.  Given his multitude of comorbidities, his wall thickening on CT scan may be related his his fluid retention from CKD or CHF.  The [place] will allow [place] to better visualize the gallbladder and the HIDA will allow [place] to confirm the need for intervention either surgically or with a perc chole drain.  We will await these imaging studies prior to determining a plan of care for this patient.  He is on Xarelto and if his studies were positive and he were to need intervention whether that be a drain or surgery this will need to be held.  We will formally see the patient once his [place] and HIDA are completed.  I have made the patient NPO for his [place] and HIDA.  He will need to have his narcotics held for 6 hrs prior to HIDA as well.  Letha Cape 11:02 AM 10/12/2017

## 2017-10-12 NOTE — Progress Notes (Signed)
PROGRESS NOTE    [name]  GQB:[id number] DOB: 1950/10/16 DOA: 09/29/2017 PCP: [name], MD (Inactive)    Brief Narrative:   67 yo male former smoker from [facility] with vomiting, altered mental status, hypoxia. PMHx of CAD, diastolic CHF, CKD 3, DM, HLD, HTN, OSA, Neuropathy, GI bleeding, A fib, PAD s/p Rt AKA, ESBL E coli bacteremia.  Intubated in ER and started on pressors for septic shock with concern for aspiration pneumonia.  Found to have severe hypokalemia.  Found to also have C diff.  He was stabilized in the ICU.  Extubated.  Transferred to stepdown unit. Currently he is on [facility] service on the floor.   Assessment & Plan:   Active Problems:   Septic shock (HCC)   Septic shock secondary to aspiration pneumonitis, urinary tract infection and  C diff colitis. Was on IV pressors in ICU currently off pressors and on stress dose steroids. Discontinued steroids as his sepsis is resolved.  Afebrile, leukocytosis improving.  BP remains stable.   H/o diastolic heart failure: chronic, restarted the lasix as he has left leg edema and scrotal edema.  Creatinine is stable. Will start him on daily potassium supplementation.   Acute hypoxic respiratory failure Secondary to aspiration pneumonitis. Patient was intubated and extubated on 3/24. Weaned him off the oxygen.  D/c telemetry.  Completed antibiotics.     History of obstructive sleep apnea requiring CPAP at night. Pt been refusing CPAP.    Urinary tract infection showed [place] urine culture showed Acinetobacter as well as VRE.   Completed 5 days of Unasyn.  D.c IV antibiotoics.    C. difficile colitis currently on oral vancomycin to complete the course.  Stool out put has improved and stool is more formed, the flexi seal is finally out.   Abdominal pain , crampy, non radiating not associated with nausea, or vomiting, unclear etiology. Bowel sounds are good. CT abd and pelvis obtained last night, showed  gall bladder wall thickening, with some sludge, and pericholecystic fluid. Surgery consulted to see if he needs cholecystectomy. HIDA scan and [place] ordered, plan to get it done in am,. NPO after midnight ordered.    Blood culture one bottle showing clostridium species discussed with Dr Linus Salmons, suggested its probably contaminant.    PVCs/nonsustained VT Echocardiogram showed normal systolic function monitor on telemetry.  Asymptomatic.    Acute renal failure improved, creatinine is at baseline.    Hypokalemia replaced, repeat bmp shows persistent hypokalemia and hypomagnesemia.    Hypocalcemia: corrected calcium level is between 8 to 9.  Continue to monitor. Asymptomatic.   History of coronary artery disease/chronic diastolic heart failure Patient currently denies any chest pain or shortness of breath.     Atrial fibrillation rate controlled , on xarelto for anti coagulation.    Anemia of chronic disease hemoglobin stable around 7.  Will ordered one unit of prbc transfusion.  Repeat cbc in am shows hemoglobin of 8.3.    Hypomagnesemia:  Replaced.    Type 2 diabetes mellitus  Better controlled.  Continue with SSI.  Recent Labs    10/11/17 2140 10/12/17 0804 10/12/17 1143  GLUCAP 240* 142* 216*   Started him on 10 unit of levemir as he was getting hypoglycemic on 20 units of levemir earlier.his a1C IS 6.1    DVT prophylaxis: XARELTO Code Status: FULL CODE Family Communication: none at bedside Disposition Plan: discharge SNF after surgery evaluation.   Consultants:   Surgery.    STUDIES:  Renal u/s 3/21 >> normal kidneys Echo 3/22 >> mild LVH, EF 55 to 60%  CULTURES: Blood 3/21 >>clostridium in one bottle  Urine 3/21 >> Enterococcus, Acinetobacter >>  C diff PCR 3/22 >> Ag positive, toxin negative, PCR positive  ANTIBIOTICS: Vancomycin 3/21 >> 3/22 Meropenem 3/21 >> 3/25 Enteral vancomycin 3/23 >>   Unasyn 3/25>>3/30  SIGNIFICANT EVENTS: 3/21  Admit 3/24 Off pressors, extubated  LINES/TUBES: ETT 3/21 >> 3/24   Subjective: Continues to have crampy abd pain , no nausea, or vomiting,. Rectal tube is out.   Objective: Vitals:   10/11/17 1212 10/11/17 2143 10/12/17 0614 10/12/17 1208  BP: (!) 122/42 (!) 106/44 124/63 (!) 121/51  Pulse: 85 93 89 (!) 112  Resp: 17 18 18 17   Temp: 98.7 F (37.1 C) 98.3 F (36.8 C) 98.2 F (36.8 C) 98.7 F (37.1 C)  TempSrc: Oral Oral Oral Oral  SpO2: 98% 96% 99% 97%  Weight:   126.3 kg (278 lb 7.1 oz)   Height:        Intake/Output Summary (Last 24 hours) at 10/12/2017 1429 Last data filed at 10/12/2017 1210 Gross per 24 hour  Intake 480 ml  Output 3875 ml  Net -3395 ml   Filed Weights   10/09/17 0500 10/11/17 0423 10/12/17 0614  Weight: 125.5 kg (276 lb 10.8 oz) 128.6 kg (283 lb 8.2 oz) 126.3 kg (278 lb 7.1 oz)    Examination:  General exam: REPORTS HE IS IN PAIN. As he didn't receive pain meds all morning.  Respiratory system: diminished at bases, no wheezing or rhonchi.  Cardiovascular system: S1 & S2 heard, RRR. No JVD, murmurs, . No pedal edema. Gastrointestinal system: Abdomen is soft non distended,tender generalized, bowel sounds are good. . Central nervous system: Alert and oriented. Non focal. Extremities: AKA RIGHT LE. Skin: unstageable sacral  ulcer.  Psychiatry:. Mood & affect appropriate.     Data Reviewed: I have personally reviewed following labs and imaging studies  CBC: Recent Labs  Lab 10/06/17 1146 10/07/17 0454 10/09/17 1834 10/10/17 0524 10/12/17 0704  WBC 17.1* 14.9* 15.8* 15.6* 15.5*  NEUTROABS 13.8*  --  11.9*  --  11.6*  HGB 8.3* 8.3* 7.5* 7.8* 8.3*  HCT 27.3* 27.4* 23.7* 25.6* 26.9*  MCV 75.8* 76.3* 76.9* 77.6* 78.4  PLT 355 350 299 317 191   Basic Metabolic Panel: Recent Labs  Lab 10/06/17 1146 10/07/17 0454 10/09/17 1834 10/10/17 0524 10/11/17 0538 10/12/17 0704 10/12/17 0706  NA 141 142 141  --  139 140  --   K 3.1* 3.2* 2.6*  3.1* 4.0 3.2*  --   CL 110 111 110  --  107 106  --   CO2 21* 24 24  --  24 27  --   GLUCOSE 206* 159* 169*  --  242* 147*  --   BUN 8 8 7   --  7 7  --   CREATININE 0.73 0.76 0.64  --  0.65 0.63  --   CALCIUM 7.0* 6.9* 6.6*  --  7.0* 7.1*  --   MG  --   --   --  1.2* 1.6*  --  1.6*   GFR: Estimated Creatinine Clearance: 126.4 mL/min (by C-G formula based on SCr of 0.63 mg/dL). Liver Function Tests: No results for input(s): AST, ALT, ALKPHOS, BILITOT, PROT, ALBUMIN in the last 168 hours. No results for input(s): LIPASE, AMYLASE in the last 168 hours. No results for input(s): AMMONIA in the last 168 hours. Coagulation  Profile: No results for input(s): INR, PROTIME in the last 168 hours. Cardiac Enzymes: No results for input(s): CKTOTAL, CKMB, CKMBINDEX, TROPONINI in the last 168 hours. BNP (last 3 results) No results for input(s): PROBNP in the last 8760 hours. HbA1C: Recent Labs    10/11/17 1446  HGBA1C 6.1*   CBG: Recent Labs  Lab 10/11/17 1211 10/11/17 1612 10/11/17 2140 10/12/17 0804 10/12/17 1143  GLUCAP 251* 145* 240* 142* 216*   Lipid Profile: No results for input(s): CHOL, HDL, LDLCALC, TRIG, CHOLHDL, LDLDIRECT in the last 72 hours. Thyroid Function Tests: No results for input(s): TSH, T4TOTAL, FREET4, T3FREE, THYROIDAB in the last 72 hours. Anemia Panel: No results for input(s): VITAMINB12, FOLATE, FERRITIN, TIBC, IRON, RETICCTPCT in the last 72 hours. Sepsis Labs: No results for input(s): PROCALCITON, LATICACIDVEN in the last 168 hours.  No results found for this or any previous visit (from the past 240 hour(s)).       Radiology Studies: Ct Abdomen Pelvis W Contrast  Result Date: 10/11/2017 CLINICAL DATA:  Abdominal pain. Currently admitted for septic shock, aspiration pneumonia, and C difficile colitis. EXAM: CT ABDOMEN AND PELVIS WITH CONTRAST TECHNIQUE: Multidetector CT imaging of the abdomen and pelvis was performed using the standard protocol  following bolus administration of intravenous contrast. CONTRAST:  152m ISOVUE-300 IOPAMIDOL (ISOVUE-300) INJECTION 61% COMPARISON:  CT abdomen pelvis dated September 15, 2015. FINDINGS: Lower chest: Moderate bilateral pleural effusions with consolidation/atelectasis in the left greater than right lower lobes. Hepatobiliary: No focal liver abnormality. Layering gallbladder sludge with mildly thickened or edematous wall. No biliary dilatation. Pancreas: Unremarkable. No pancreatic ductal dilatation or surrounding inflammatory changes. Spleen: Normal in size without focal abnormality. Adrenals/Urinary Tract: Unchanged 3.2 cm adenoma in the left adrenal gland. The right adrenal gland is unremarkable. No focal renal lesion. No renal or ureteral calculi. No hydronephrosis. The bladder is decompressed by Foley catheter. Stomach/Bowel: Large amount of stool in the distal sigmoid colon and rectum. Rectal tube in place. Mild gaseous distention of the sigmoid colon. No evidence of obstruction, bowel wall thickening, or surrounding inflammatory changes. The stomach is within normal limits. The appendix is surgically absent. Vascular/Lymphatic: Aortic atherosclerosis. Mildly enlarged portacaval lymph node, measuring up to 1.2 cm, previously 1.9 cm. Reproductive: Prostate is unremarkable. Other: Small ascites.  No pneumoperitoneum. Musculoskeletal: Diffuse anasarca. No acute or significant osseous findings. IMPRESSION: 1. Large amount of stool in the distal sigmoid colon and rectum with mild gaseous distention of the sigmoid colon. Correlate for rectal tube function. No definite evidence of gastroenteritis or colitis. 2. Gallbladder sludge with mild wall thickening. Small amount of pericholecystic fluid could be related to ascites. Correlate for acute cholecystitis and consider right upper quadrant ultrasound for further evaluation as clinically indicated. 3. Moderate bilateral pleural effusions with consolidation/atelectasis in the  left greater than right lower lobes. 4. Small ascites and diffuse anasarca. 5.  Aortic atherosclerosis (ICD10-I70.0). Electronically Signed   By: WTitus DubinM.D.   On: 10/11/2017 18:52        Scheduled Meds: . aspirin EC  81 mg Oral Daily  . atorvastatin  80 mg Oral q1800  . chlorhexidine gluconate (MEDLINE KIT)  15 mL Mouth Rinse BID  . famotidine  20 mg Oral QHS  . ferrous sulfate  325 mg Oral BID WC  . furosemide  40 mg Oral Daily  . gabapentin  600 mg Oral BID  . Gerhardt's butt cream   Topical TID  . hydrocerin   Topical BID  . insulin aspart  0-20 Units Subcutaneous TID WC  . insulin aspart  0-5 Units Subcutaneous QHS  . insulin aspart  3 Units Subcutaneous TID WC  . insulin detemir  10 Units Subcutaneous Daily  . magnesium oxide  400 mg Oral BID  . potassium chloride  40 mEq Oral TID  . protein supplement shake  11 oz Oral Q24H  . rivaroxaban  20 mg Oral Q supper  . vitamin C  250 mg Oral BID   Continuous Infusions: . magnesium sulfate 1 - 4 g bolus IVPB       LOS: 13 days    Time spent: 35 minutes    Hosie Poisson, MD [name] Pager [phone]  If 7PM-7AM, please contact night-coverage [email or web address] Password [facility] 10/12/2017, 2:29 PM

## 2017-10-13 ENCOUNTER — Inpatient Hospital Stay (HOSPITAL_COMMUNITY): Payer: Medicare Other

## 2017-10-13 ENCOUNTER — Encounter (HOSPITAL_COMMUNITY): Payer: Self-pay | Admitting: General Surgery

## 2017-10-13 LAB — BASIC METABOLIC PANEL
ANION GAP: 6 (ref 5–15)
BUN: 7 mg/dL (ref 6–20)
CALCIUM: 7.5 mg/dL — AB (ref 8.9–10.3)
CO2: 29 mmol/L (ref 22–32)
Chloride: 105 mmol/L (ref 101–111)
Creatinine, Ser: 0.67 mg/dL (ref 0.61–1.24)
GFR calc Af Amer: >60 mL/min (ref >60)
Glucose, Bld: 91 mg/dL (ref 65–99)
POTASSIUM: 3.8 mmol/L (ref 3.5–5.1)
SODIUM: 140 mmol/L (ref 135–145)

## 2017-10-13 LAB — GLUCOSE, CAPILLARY
GLUCOSE-CAPILLARY: 149 mg/dL — AB (ref 65–99)
GLUCOSE-CAPILLARY: 159 mg/dL — AB (ref 65–99)
Glucose-Capillary: 103 mg/dL — ABNORMAL HIGH (ref 65–99)
Glucose-Capillary: 151 mg/dL — ABNORMAL HIGH (ref 65–99)

## 2017-10-13 MED ORDER — HYDROMORPHONE HCL 1 MG/ML IJ SOLN
1.0000 mg | INTRAMUSCULAR | Status: DC | PRN
  Administered 2017-10-13 – 2017-10-14 (×12): 1 mg via INTRAVENOUS
  Filled 2017-10-13 (×12): qty 1

## 2017-10-13 NOTE — NC FL2 (Signed)
[place] LEVEL OF CARE SCREENING TOOL     IDENTIFICATION  Patient Name: [name] Birthdate: [birth year] Sex: male Admission Date (Current Location): 09/29/2017  [facility] and [place] Number:  Herbalist and Address:  [facility] [place], [place] [address] [place], [place] [address]      Provider Number: [id number]  Attending Physician Name and Address:  Roxan Hockey, MD  Relative Name and Phone Number:       Current Level of Care: Hospital Recommended Level of Care: [place] Prior Approval Number:    Date Approved/Denied:   PASRR Number: [id number]  Discharge Plan: SNF    Current Diagnoses: Patient Active Problem List   Diagnosis Date Noted  . C. difficile diarrhea 10/12/2017  . Hypomagnesemia 10/12/2017  . Septic shock ([place]) 09/29/2017  . Acute encephalopathy   . Palliative care encounter   . UTI (urinary tract infection) 09/01/2017  . Acute metabolic encephalopathy 02/77/4128  . S/P AKA (above knee amputation) unilateral, right ([place]) 09/01/2017  . Atrial fibrillation with rapid ventricular response ([place])   . Hyponatremia 04/11/2017  . ARF (acute renal failure) ([place]) 04/11/2017  . Pressure injury of skin 03/24/2017  . Below knee amputation status, right ([place]) 03/23/2017  . DKA (diabetic ketoacidoses) ([place]) 03/14/2017  . Elevated troponin 03/14/2017  . Diabetic polyneuropathy associated with type 2 diabetes mellitus ([place])   . Osteoarthritis of both knees   . Diabetic nephropathy associated with type 2 diabetes mellitus ([place])   . Bilateral lower leg cellulitis 01/22/2017  . Venous stasis 08/06/2016  . At high risk for falls 07/21/2016  . CHF exacerbation ([place]) 06/10/2016  . Lactic acidosis 06/09/2016  . Syncope and collapse 03/05/2016  . Acute on chronic renal insufficiency 03/05/2016  . Obesity 12/30/2015  . Scrotal swelling   . Foot fracture   . 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 Lower GI bleed   . Pressure ulcer 12/24/2015  . Acute kidney injury ([place]) 12/23/2015  . Back pain 12/23/2015  . Fall   . Metatarsal fracture   . Traumatic rhabdomyolysis ([place])   . Hyperkalemia   . Lisfranc dislocation   . Hyperglycemia   . Sepsis, unspecified organism ([place]) 09/08/2015  . Scrotal wall abscess 09/08/2015  . AKI (acute kidney injury) ([place]) 09/08/2015  . Chest wall pain   . On amiodarone therapy 12/08/2014  . Respiratory failure with hypoxia ([place]) 12/04/2014  . Hypoxia 12/04/2014  . Acute on chronic respiratory failure with hypoxia ([place])   . Atrial fibrillation with RVR ([place]) 09/20/2014  . Acute on chronic diastolic CHF (congestive heart failure) ([place]) 09/19/2014  . Acute respiratory failure ([place]) 09/19/2014  . Abnormal nuclear cardiac imaging test 02/12/2014  . Dyspnea 02/12/2014  . Chronic anticoagulation- Coumadin 02/12/2014  . CKD (chronic kidney disease) stage 3, GFR 30-59 ml/min (HCC) 02/12/2014  . Chronic diastolic congestive heart failure ([place]) 02/11/2014  . Encounter for therapeutic drug monitoring 08/03/2013  . Pilonidal cyst 01/25/2013  . Perirectal cellulitis 09/06/2012  . Long term (current) use of anticoagulants 11/18/2011  . Obstructive sleep apnea- unable to tolerate c-pap 08/04/2011  . CAD S/P percutaneous coronary angioplasty 08/04/2011  . ANEMIA-IRON DEFICIENCY 01/06/2009  . OTHER AND UNSPECIFIED COAGULATION DEFECTS 01/06/2009  . Anemia 01/02/2009  . CARDIOVASCULAR FUNCTION STUDY, ABNORMAL 01/02/2009  . Essential hypertension, benign 10/22/2008  . Atrial fibrillation 10/22/2008  . Hyperlipidemia 09/23/2008  . Hypokalemia 09/23/2008  . Obesity, Class III, BMI 40-49.9 (  morbid obesity) ([place]) 09/23/2008  . CELLULITIS AND ABSCESS OF LEG EXCEPT FOOT 09/23/2008    Orientation RESPIRATION BLADDER Height & Weight     Self, Time, Situation, Place  Normal Continent Weight: 271 lb 6.4 oz (123.1 kg) Height:  6' (182.9 cm)  BEHAVIORAL  SYMPTOMS/MOOD NEUROLOGICAL BOWEL NUTRITION STATUS      (rectal tube) Diet(carb modified diet)  AMBULATORY STATUS COMMUNICATION OF NEEDS Skin   Extensive Assist Verbally((does not speak much)) PU Stage and Appropriate Care, Skin abrasions, Surgical wounds(unstageable pressure injury sacrum, daily dressing changes, gauze dressing, left foot skin abrasion, closed incision scrotum)                       Personal Care Assistance Level of Assistance  Bathing, Feeding, Dressing Bathing Assistance: Limited assistance Feeding assistance: Limited assistance Dressing Assistance: Limited assistance     Functional Limitations Info  Sight, Hearing, Speech Sight Info: Adequate Hearing Info: Adequate Speech Info: Adequate    SPECIAL CARE FACTORS FREQUENCY  PT (By licensed PT), OT (By licensed OT)     PT Frequency: 3x OT Frequency: 3x            Contractures Contractures Info: Not present    Additional Factors Info  Allergies, Isolation Precautions Code Status Info: full code Allergies Info: nka     Isolation Precautions Info: contact precautions, MRSA, ESBL     Current Medications (10/13/2017):  This is the current hospital active medication list Current Facility-Administered Medications  Medication Dose Route Frequency Provider Last Rate Last Dose  . acetaminophen (TYLENOL) tablet 650 mg  650 mg Oral Q4H PRN Hosie Poisson, MD   650 mg at 10/02/17 0947  . albuterol (PROVENTIL) (2.5 MG/3ML) 0.083% nebulizer solution 2.5 mg  2.5 mg Nebulization Q4H PRN Hosie Poisson, MD      . aspirin EC tablet 81 mg  81 mg Oral Daily Hosie Poisson, MD   81 mg at 10/13/17 1015  . atorvastatin (LIPITOR) tablet 80 mg  80 mg Oral q1800 Hosie Poisson, MD   80 mg at 10/12/17 1802  . calcium carbonate (TUMS - dosed in mg elemental calcium) chewable tablet 400 mg of elemental calcium  2 tablet Oral BID PRN Arby Barrette A, NP   400 mg of elemental calcium at 10/09/17 2145  . chlorhexidine gluconate  (MEDLINE KIT) (PERIDEX) 0.12 % solution 15 mL  15 mL Mouth Rinse BID Hosie Poisson, MD   15 mL at 10/08/17 1006  . famotidine (PEPCID) tablet 20 mg  20 mg Oral QHS Hosie Poisson, MD   20 mg at 10/12/17 2207  . ferrous sulfate tablet 325 mg  325 mg Oral BID WC Hosie Poisson, MD   325 mg at 10/12/17 1802  . furosemide (LASIX) tablet 40 mg  40 mg Oral Daily Hosie Poisson, MD   40 mg at 10/13/17 1015  . gabapentin (NEURONTIN) capsule 600 mg  600 mg Oral BID Hosie Poisson, MD   600 mg at 10/13/17 1015  . Gerhardt's butt cream   Topical TID Hosie Poisson, MD      . HYDROmorphone (DILAUDID) injection 1 mg  1 mg Intravenous Q3H PRN Denton Brick, Courage, MD   1 mg at 10/13/17 1331  . insulin aspart (novoLOG) injection 0-20 Units  0-20 Units Subcutaneous TID WC Hosie Poisson, MD   3 Units at 10/13/17 0902  . insulin aspart (novoLOG) injection 0-5 Units  0-5 Units Subcutaneous QHS Hosie Poisson, MD   2 Units at 10/11/17 2250  .  insulin aspart (novoLOG) injection 3 Units  3 Units Subcutaneous TID WC Hosie Poisson, MD   3 Units at 10/13/17 1240  . insulin detemir (LEVEMIR) injection 10 Units  10 Units Subcutaneous Daily Hosie Poisson, MD   10 Units at 10/13/17 1037  . magnesium oxide (MAG-OX) tablet 400 mg  400 mg Oral BID Hosie Poisson, MD   400 mg at 10/13/17 1015  . ondansetron (ZOFRAN) injection 4 mg  4 mg Intravenous Q6H PRN Hosie Poisson, MD      . oxyCODONE (Oxy IR/ROXICODONE) immediate release tablet 10 mg  10 mg Oral Q4H PRN Hosie Poisson, MD   10 mg at 10/13/17 1044  . potassium chloride SA (K-DUR,KLOR-CON) CR tablet 40 mEq  40 mEq Oral TID Hosie Poisson, MD   40 mEq at 10/13/17 1015  . protein supplement (PREMIER PROTEIN) liquid  11 oz Oral Q24H Hosie Poisson, MD      . rivaroxaban (XARELTO) tablet 20 mg  20 mg Oral Q supper Eudelia Bunch, RPH   20 mg at 10/12/17 1802  . sodium chloride flush (NS) 0.9 % injection 10-40 mL  10-40 mL Intracatheter PRN Hosie Poisson, MD   10 mL at 10/12/17 0909  .  vitamin C (ASCORBIC ACID) tablet 250 mg  250 mg Oral BID Hosie Poisson, MD   250 mg at 10/13/17 1015     Discharge Medications: Please see discharge summary for a list of discharge medications.  Relevant Imaging Results:  Relevant Lab Results:   Additional Information SS# [id number]  palliative to follow  Nila Nephew, LCSW

## 2017-10-13 NOTE — Consult Note (Addendum)
[name] September 27, 1950  [id number].    Requesting MD: Dr. Hosie Poisson Chief Complaint/Reason for Consult: abdominal pain  HPI:  This is a 67yo WM with multiple medical problems including CAD, CHF, CKD 3, DM, HTN, OSA, who was admitted around 2 weeks ago with hypoxia secondary to respiratory failure from aspiration pneumonitis.  Since admission he states he has had some RLQ abdominal pain.  He states eating makes his pain better.  He denies nausea or vomiting.  He has some diarrhea and was diagnosed with C. Diff colitis.  This is improving with oral vancomycin.  Due to this persistent abdominal pain, he underwent a CT scan of the AP which revealed gallbladder sludge with mild wall thickening and a small amount of pericholecystic fluid that could be related to ascites.  We were asked to evaluate the patient to determine if he had cholecystitis.  An Korea was then ordered to further evaluate this finding.  A HIDA scan was ordered but he was unable to go that long without pain medication secondary to his decubitus ulcer.  He actually states his abdominal pain in improved today.  ROS: ROS Please see HPI, otherwise all other systems are negative, except inability to walk secondary to R AKA, he has a wound on his left foot.   Family History  Adopted: Yes  Problem Relation Age of Onset  . Other Other        PT ADOPTED    Past Medical History:  Diagnosis Date  . Arthritis    "hands and lower back" (09/19/2014)  . CAD (coronary artery disease)    a. s/p PCI to RCA in 2012. b. prior cath in 01/2014 with elevated L/RH pressures, mild-mod CAD of LAD/LCx with patent RCA, normal EF, c/b CIN/CHF.  [name] Carpal tunnel syndrome, bilateral   . Cellulitis   . Chronic diastolic CHF (congestive heart failure) ([place])   . Chronic kidney disease (CKD), stage III (moderate) (H[facility])   . Chronic lower back pain   . Diabetes ([place])   . DKA (diabetic ketoacidoses) ([place]) 03/2017  . History of blood transfusion ~  1954   "related to OR"  . Hyperlipidemia   . Hypertension   . Hypoxia    a. Qualified for home O2 at [facility] in 09/2014.  [name] Lower GI bleed   . Microcytic anemia   . Morbid obesity ([place])   . Neuropathy   . OSA (obstructive sleep apnea)    "I wear nasal prongs; haven't been using prongs recently" (09/19/2014)  . PAF (paroxysmal atrial fibrillation) ([place])    TEE DCCV 09/23/2014  . Physical deconditioning   . Pilonidal cyst 1980's; 01/25/2013  . Scrotal abscess   . Type II diabetes mellitus ([place])     Past Surgical History:  Procedure Laterality Date  . ABDOMINAL SURGERY  ~ 1954   BENIGN TUMOR REMOVED  . AMPUTATION Right 03/23/2017   Procedure: RIGHT BELOW KNEE AMPUTATION;  Surgeon: Newt Minion, MD;  Location: [facility];  Service: Orthopedics;  Laterality: Right;  . APPENDECTOMY    . BELOW KNEE LEG AMPUTATION Right 03/23/2017  . CARDIOVERSION  2010   Archie Endo 09/19/2014  . CARDIOVERSION N/A 09/23/2014   Procedure: CARDIOVERSION;  Surgeon: Lelon Perla, MD;  Location: [facility] ENDOSCOPY;  Service: Cardiovascular;  Laterality: N/A;  . CARDIOVERSION N/A 12/11/2014   Procedure: CARDIOVERSION;  Surgeon: Sanda Klein, MD;  Location: [facility];  Service: Cardiovascular;  Laterality: N/A;  . CATARACT EXTRACTION W/PHACO Right 11/15/2012  Procedure: CATARACT EXTRACTION PHACO AND INTRAOCULAR LENS PLACEMENT (IOC);  Surgeon: Adonis Brook, MD;  Location: [place];  Service: Ophthalmology;  Laterality: Right;  . CATARACT EXTRACTION W/PHACO Left 11/29/2012   Procedure: CATARACT EXTRACTION PHACO AND INTRAOCULAR LENS PLACEMENT (IOC);  Surgeon: Adonis Brook, MD;  Location: [place];  Service: Ophthalmology;  Laterality: Left;  . CORONARY ANGIOPLASTY WITH STENT PLACEMENT  August 2012   RCA DES - [facility]  . DEBRIDEMENT  FOOT Left    debriding diabetic foot ulcers  . EYE SURGERY    . FOREIGN BODY REMOVAL Right 2014   heel,  splinter removal   . LEFT AND RIGHT HEART CATHETERIZATION WITH  CORONARY ANGIOGRAM N/A 01/31/2014   Procedure: LEFT AND RIGHT HEART CATHETERIZATION WITH CORONARY ANGIOGRAM;  Surgeon: Blane Ohara, MD;  Location: [facility] CATH LAB;  Service: Cardiovascular;  Laterality: N/A;  . PARS PLANA VITRECTOMY Left 06/05/2013   Procedure: PARS PLANA VITRECTOMY WITH 23 GAUGE with Endolaser(constellation);  Surgeon: Adonis Brook, MD;  Location: [place];  Service: Ophthalmology;  Laterality: Left;  . PILONIDAL CYST EXCISION N/A 01/08/2013   Procedure: CYST EXCISION PILONIDAL EXTENSIVE;  Surgeon: Ralene Ok, MD;  Location: [place];  Service: General;  Laterality: N/A;  . PILONIDAL CYST EXCISION  1980's   "in [place]"  . TEE WITHOUT CARDIOVERSION N/A 09/23/2014   Procedure: TRANSESOPHAGEAL ECHOCARDIOGRAM (TEE);  Surgeon: Lelon Perla, MD;  Location: [facility] ENDOSCOPY;  Service: Cardiovascular;  Laterality: N/A;  . TONSILLECTOMY      Social History:  reports that he quit smoking about 34 years ago. His smoking use included cigarettes. He has a 20.00 pack-year smoking history. He has never used smokeless tobacco. He reports that he does not drink alcohol or use drugs.  Allergies: No Known Allergies  Medications Prior to Admission  Medication Sig Dispense Refill  . acetaminophen (TYLENOL) 500 MG tablet Take 500 mg by mouth every 6 (six) hours as needed for mild pain.     [name] aluminum-magnesium hydroxide-simethicone (MAALOX) 416-384-53 MG/5ML SUSP Take 30 mLs by mouth every 4 (four) hours as needed (heartburn).    . Amino Acids-Protein Hydrolys (FEEDING SUPPLEMENT, PRO-STAT SUGAR FREE 64,) LIQD Take 30 mLs by mouth 3 (three) times daily with meals. 900 mL 0  . aspirin EC 81 MG EC tablet Take 1 tablet (81 mg total) by mouth daily. 30 tablet 2  . atorvastatin (LIPITOR) 80 MG tablet Take 1 tablet (80 mg total) by mouth daily. 30 tablet 10  . calcium-vitamin D (OSCAL WITH D) 500-200 MG-UNIT tablet Take 1 tablet by mouth daily.    . collagenase (SANTYL) ointment Apply 1 application  topically daily.    [name] diltiazem (CARDIZEM) 120 MG tablet Take 1 tablet (120 mg total) by mouth every 6 (six) hours. 120 tablet 0  . feeding supplement, GLUCERNA SHAKE, (GLUCERNA SHAKE) LIQD Take 237 mLs by mouth 3 (three) times daily between meals. 30 Can 0  . ferrous sulfate 325 (65 FE) MG tablet Take 1 tablet (325 mg total) by mouth 2 (two) times daily with a meal. 30 tablet 0  . gabapentin (NEURONTIN) 300 MG capsule Take 2 capsules (600 mg total) by mouth 2 (two) times daily. 60 capsule 3  . hydrocerin (EUCERIN) CREA Apply 1 application topically 3 (three) times daily. legs 454 g 3  . insulin aspart (NOVOLOG FLEXPEN) 100 UNIT/ML FlexPen Inject 2-10 Units into the skin 4 (four) times daily -  with meals and at bedtime. Per sliding scale.. 150-200=2  units 201-250=4 units 251-300=6 units 301-350=8 units 351-400=10 units Over 400 call MD    . insulin detemir (LEVEMIR) 100 UNIT/ML injection Inject 40 Units into the skin daily.     [name] lidocaine (LMX) 4 % cream Apply topically daily as needed (during hydro). 30 g 0  . loperamide (IMODIUM) 2 MG capsule Take 1 capsule (2 mg total) by mouth every 6 (six) hours as needed for diarrhea or loose stools. 30 capsule 0  . metoprolol succinate (TOPROL-XL) 50 MG 24 hr tablet Take 50 mg by mouth daily. Take with or immediately following a meal.    . Multiple Vitamin (MULTIVITAMIN WITH MINERALS) TABS Take 1 tablet by mouth daily.    . ondansetron (ZOFRAN) 4 MG tablet Take 4 mg by mouth every 8 (eight) hours as needed for nausea or vomiting.    . Oxycodone HCl 20 MG TABS Take 20 mg by mouth every 4 (four) hours as needed (pain).    . potassium & sodium phosphates (PHOS-NAK) 280-160-250 MG PACK Take 1 packet by mouth 4 (four) times daily -  with meals and at bedtime. 120 packet 0  . potassium chloride SA (K-DUR,KLOR-CON) 20 MEQ tablet Take 40 mEq by mouth every 6 (six) hours. For 2 days    . XARELTO 20 MG TABS tablet TAKE 1 TABLET BY MOUTH EVERY DAY WITH SUPPER 30  tablet 0  . OXYGEN Inhale 2-3 L into the lungs continuous.       Physical Exam: Blood pressure (!) 124/58, pulse 80, temperature 97.9 F (36.6 C), temperature source Oral, resp. rate 18, height 6' (1.829 m), weight 271 lb 6.4 oz (123.1 kg), SpO2 95 %. General: pleasant, morbidly obese white male who is laying in bed in NAD HEENT: head is normocephalic, atraumatic.  Sclera are noninjected.  PERRL.  Ears and nose without any masses or lesions.  Mouth is pink and moist Heart: regular, rate, and rhythm.  Normal s1,s2. No obvious murmurs, gallops, or rubs noted.  Palpable bilateral radial pulses and left pedal pulse palpable Lungs: CTAB, no wheezes, rhonchi, or rales noted.  Respiratory effort nonlabored Abd: soft, minimally tender in RLQ, but nontender in RUQ with no Murphy's sign, ND, but obese, +BS, no masses, hernias, or organomegaly MS: upper extremities with abnormalities.  R AKA, left LE with some edema and a dorsal foot wound with a dressing in place. Skin: warm and dry with no masses, lesions, or rashes Psych: A&Ox3 with an appropriate affect.   Results for orders placed or performed during the hospital encounter of 09/29/17 (from the past 48 hour(s))  Glucose, capillary     Status: Abnormal   Collection Time: 10/11/17 12:11 PM  Result Value Ref Range   Glucose-Capillary 251 (H) 65 - 99 mg/dL  Hemoglobin A1c     Status: Abnormal   Collection Time: 10/11/17  2:46 PM  Result Value Ref Range   Hgb A1c MFr Bld 6.1 (H) 4.8 - 5.6 %    Comment: (NOTE) Pre diabetes:          5.7%-6.4% Diabetes:              >6.4% Glycemic control for   <7.0% adults with diabetes    Mean Plasma Glucose 128.37 mg/dL    Comment: Performed at [facility] [address], [place], [place] [address]  Glucose, capillary     Status: Abnormal   Collection Time: 10/11/17  4:12 PM  Result Value Ref Range   Glucose-Capillary 145 (H)  65 - 99 mg/dL  Glucose, capillary     Status: Abnormal   Collection  Time: 10/11/17  9:40 PM  Result Value Ref Range   Glucose-Capillary 240 (H) 65 - 99 mg/dL   Comment 1 Notify RN   CBC with Differential/Platelet     Status: Abnormal   Collection Time: 10/12/17  7:04 AM  Result Value Ref Range   WBC 15.5 (H) 4.0 - 10.5 K/uL   RBC 3.43 (L) 4.22 - 5.81 MIL/uL   Hemoglobin 8.3 (L) 13.0 - 17.0 g/dL   HCT 26.9 (L) 39.0 - 52.0 %   MCV 78.4 78.0 - 100.0 fL   MCH 24.2 (L) 26.0 - 34.0 pg   MCHC 30.9 30.0 - 36.0 g/dL   RDW 22.8 (H) 11.5 - 15.5 %   Platelets 320 150 - 400 K/uL   Neutrophils Relative % 75 %   Lymphocytes Relative 14 %   Monocytes Relative 9 %   Eosinophils Relative 2 %   Basophils Relative 0 %   Neutro Abs 11.6 (H) 1.7 - 7.7 K/uL   Lymphs Abs 2.2 0.7 - 4.0 K/uL   Monocytes Absolute 1.4 (H) 0.1 - 1.0 K/uL   Eosinophils Absolute 0.3 0.0 - 0.7 K/uL   Basophils Absolute 0.0 0.0 - 0.1 K/uL   RBC Morphology POLYCHROMASIA PRESENT     Comment: Performed at [facility], [place] [address] [place], [place] [address]  Basic metabolic panel     Status: Abnormal   Collection Time: 10/12/17  7:04 AM  Result Value Ref Range   Sodium 140 135 - 145 mmol/L   Potassium 3.2 (L) 3.5 - 5.1 mmol/L    Comment: DELTA CHECK NOTED REPEATED TO VERIFY    Chloride 106 101 - 111 mmol/L   CO2 27 22 - 32 mmol/L   Glucose, Bld 147 (H) 65 - 99 mg/dL   BUN 7 6 - 20 mg/dL   Creatinine, Ser 0.63 0.61 - 1.24 mg/dL   Calcium 7.1 (L) 8.9 - 10.3 mg/dL   GFR calc non Af Amer >60 >60 mL/min   GFR calc Af Amer >60 >60 mL/min    Comment: (NOTE) The eGFR has been calculated using the CKD EPI equation. This calculation has not been validated in all clinical situations. eGFR's persistently <60 mL/min signify possible Chronic Kidney Disease.    Anion gap 7 5 - 15    Comment: Performed at [facility], [place] [address] [place], [place] [address]  Magnesium     Status: Abnormal   Collection Time: 10/12/17  7:06 AM  Result Value Ref Range    Magnesium 1.6 (L) 1.7 - 2.4 mg/dL    Comment: Performed at [facility], [place] [address], [place], [place] [address]  Glucose, capillary     Status: Abnormal   Collection Time: 10/12/17  8:04 AM  Result Value Ref Range   Glucose-Capillary 142 (H) 65 - 99 mg/dL  Glucose, capillary     Status: Abnormal   Collection Time: 10/12/17 11:43 AM  Result Value Ref Range   Glucose-Capillary 216 (H) 65 - 99 mg/dL  Glucose, capillary     Status: Abnormal   Collection Time: 10/12/17  3:58 PM  Result Value Ref Range   Glucose-Capillary 149 (H) 65 - 99 mg/dL  Glucose, capillary     Status: Abnormal   Collection Time: 10/12/17  9:57 PM  Result Value Ref Range   Glucose-Capillary 143 (H) 65 - 99 mg/dL  Glucose, capillary     Status: Abnormal   Collection Time: 10/13/17  8:57 AM  Result Value Ref Range   Glucose-Capillary 149 (H) 65 - 99 mg/dL   Ct Abdomen Pelvis W Contrast  Result Date: 10/11/2017 CLINICAL DATA:  Abdominal pain. Currently admitted for septic shock, aspiration pneumonia, and C difficile colitis. EXAM: CT ABDOMEN AND PELVIS WITH CONTRAST TECHNIQUE: Multidetector CT imaging of the abdomen and pelvis was performed using the standard protocol following bolus administration of intravenous contrast. CONTRAST:  155m ISOVUE-300 IOPAMIDOL (ISOVUE-300) INJECTION 61% COMPARISON:  CT abdomen pelvis dated September 15, 2015. FINDINGS: Lower chest: Moderate bilateral pleural effusions with consolidation/atelectasis in the left greater than right lower lobes. Hepatobiliary: No focal liver abnormality. Layering gallbladder sludge with mildly thickened or edematous wall. No biliary dilatation. Pancreas: Unremarkable. No pancreatic ductal dilatation or surrounding inflammatory changes. Spleen: Normal in size without focal abnormality. Adrenals/Urinary Tract: Unchanged 3.2 cm adenoma in the left adrenal gland. The right adrenal gland is unremarkable. No focal renal lesion. No renal or ureteral  calculi. No hydronephrosis. The bladder is decompressed by Foley catheter. Stomach/Bowel: Large amount of stool in the distal sigmoid colon and rectum. Rectal tube in place. Mild gaseous distention of the sigmoid colon. No evidence of obstruction, bowel wall thickening, or surrounding inflammatory changes. The stomach is within normal limits. The appendix is surgically absent. Vascular/Lymphatic: Aortic atherosclerosis. Mildly enlarged portacaval lymph node, measuring up to 1.2 cm, previously 1.9 cm. Reproductive: Prostate is unremarkable. Other: Small ascites.  No pneumoperitoneum. Musculoskeletal: Diffuse anasarca. No acute or significant osseous findings. IMPRESSION: 1. Large amount of stool in the distal sigmoid colon and rectum with mild gaseous distention of the sigmoid colon. Correlate for rectal tube function. No definite evidence of gastroenteritis or colitis. 2. Gallbladder sludge with mild wall thickening. Small amount of pericholecystic fluid could be related to ascites. Correlate for acute cholecystitis and consider right upper quadrant ultrasound for further evaluation as clinically indicated. 3. Moderate bilateral pleural effusions with consolidation/atelectasis in the left greater than right lower lobes. 4. Small ascites and diffuse anasarca. 5.  Aortic atherosclerosis (ICD10-I70.0). Electronically Signed   By: WTitus DubinM.D.   On: 10/11/2017 18:52   UK[place]Abdomen Limited Ruq  Result Date: 10/12/2017 CLINICAL DATA:  67year old male with abnormal gallbladder on recent CT. Recent abdominal pain. EXAM: ULTRASOUND ABDOMEN LIMITED RIGHT UPPER QUADRANT COMPARISON:  CT Abdomen and Pelvis 14 19 end earlier. FINDINGS: Gallbladder: Gallbladder wall thickness is at the upper limits of normal, 3 millimeters. Possible sludge, but no echogenic stones are identified within the gallbladder lumen. No pericholecystic fluid identified. No sonographic Murphy sign elicited. Common bile duct: Diameter: 4  millimeters, normal Liver: Generalized increased liver echogenicity (image 42). No discrete liver lesion. No intrahepatic biliary ductal dilatation. Portal vein is patent on color Doppler imaging with normal direction of blood flow towards the liver. Other findings: Trace perihepatic free fluid (image 19). Partially visible right pleural effusion (image 20). IMPRESSION: 1. No cholelithiasis or evidence of acute cholecystitis. 2. No evidence of acute biliary obstruction. 3. Fatty liver disease with trace perihepatic free fluid. Partially visible right pleural effusion. Electronically Signed   By: HGenevie AnnM.D.   On: 10/12/2017 15:20      Assessment/Plan 1. Abdominal pain The patient has had abdominal pain since right after admission, but states this is located in his RLQ and is improving.  His CT scan shows some sludge and possible wall thickening, but his [place]ows no gallstones or wall  thickening.  The patient is nontender in his RUQ.  It is not uncommon to see these findings on his CT scan in the setting of CKD and CHF.  The patient had some LFTs checked this admission that were normal.  Given he currently does not have pain in his RUQ, his pain is improving, and his [place] is negative for cholecystitis, we do not feel the patient needs any further evaluation or intervention.  He did have a HIDA ordered but is unable to go 6 hours without pain medication.  This has since been cancelled.  He was tolerating a carb mod diet yesterday with no issues.  I have discussed this with Dr. Denton Brick that we do not feel he needs any further work up for intervention such as surgery or drainage.  We will sign off.  Aspiration pneumonitis HTN CAD CHF CKD3 DM  Henreitta Cea, [facility] 10/13/2017, 11:12 AM Pager: [phone]

## 2017-10-13 NOTE — Progress Notes (Signed)
Patient Demographics:    [name], is a 67 y.o. male, DOB - 1950/12/09, OMV:[id number]  Admit date - 09/29/2017   Admitting Physician [name], MD  Outpatient Primary MD for the patient is [name], MD (Inactive)  LOS - 14   Chief Complaint  Patient presents with  . Emesis        Subjective:    [name] today has no fevers, no emesis,  No chest pain, no new concerns RN Apolonio Schneiders at bedside  Assessment  & Plan :    Active Problems:   Hypokalemia   Anemia   Essential hypertension, benign   Atrial fibrillation   Chronic diastolic congestive heart failure (HCC)   CKD (chronic kidney disease) stage 3, GFR 30-59 ml/min (HCC)   Respiratory failure with hypoxia (HCC)   Scrotal swelling   Below knee amputation status, right (HCC)   S/P AKA (above knee amputation) unilateral, right (HCC)   Septic shock (HCC)   C. difficile diarrhea   Hypomagnesemia  Brief Narrative:  67 yo male former smoker from [facility] with vomiting, altered mental status, hypoxia. PMHx of CAD, diastolic CHF, CKD 3, DM, HLD, HTN, OSA, Neuropathy, GI bleeding, A fib, PAD s/p Rt AKA, ESBL E coli bacteremia. Intubated in ER and started on pressors for septic shock with concern for aspiration pneumonia. Found to have severe hypokalemia. Found to also have C diff. He was stabilized in the ICU. Extubated. Transferred to stepdown unit. Currently he is on [facility] service on the floor.   Assessment & Plan:   Active Problems:   Septic shock (HCC)   1)Septic shock secondary to Aspiration Pneumonitis, urinary tract infection and C diff colitis-patient did require intubation (extubated 10/02/17) and pressors, patient is now hemodynamically stable at this time without pressures.  No hypoxia, afebrile white count is down to 10.5.  Patient has completed antibiotics for aspiration pneumonitis UTI and C.  difficile colitis  2) extensive sacral decubiti-   patient will need special mattress, extensive wound care, and wound care consult and vigilant, aggressive and watchful management of this rather extensive sacral and buttock wounds to increase the chances of healing.  His Foley catheter has been left in to help with healing of his wounds as patient is otherwise very very incontinent  3)HFpEF- H/o diastolic heart failure: chronic, and has left leg edema and scrotal edema which is improved somewhat continue low-dose Lasix, watch creatinine closely  4)History of obstructive sleep apnea requiring CPAP at night-CPAP nightly advised  5) Acinetobacter and VRE urinary tract infection - Completed 5 days of Unasyn.   6)C. difficile colitis -patient completed oral vancomycin  course.   No further diarrhea  7)Abdominal pain , crampy, non radiating not associated with nausea, or vomiting,--abdominal ultrasound without definite evidence for cholecystitis -surgical consult requested,  HIDA scan ordered,   8)Blood culture one bottle showing clostridium species discussed with Dr Linus Salmons, suggested its probably contaminant.  Patient does not look septic at this time no fevers white count is down to 10.7  9)PVCs/nonsustained VT/history of arrhythmia/chronic A Fib-no further episodes, echocardiogram showed normal systolic function monitor on telemetry. Asymptomatic.  Continue metoprolol 25 mg twice daily, continue Xarelto  10) acute on chronic anemia-at baseline Patient  has anemia of chronic disease, did receive transfusion, hemoglobin currently stable above 8  11)DM-had hypoglycemic episode, Lantus insulin decreased to 10 units daily, okay to use sliding scale, last A1c 6.1  Disposition Plan:discharge to SNF once abdominal pain and possible gallbladder issues are worked up and resolve  Consultants:  Surgery.   STUDIES:  Renal u/s 3/21 >> normal kidneys Echo 3/22 >> mild LVH, EF 55 to  60%  CULTURES: Blood 3/21 >>clostridium in one bottle Urine 3/21 >>Enterococcus, Acinetobacter >> C diff PCR 3/22 >> Ag positive, toxin negative, PCR positive  ANTIBIOTICS: Vancomycin 3/21 >> 3/22 Meropenem 3/21 >> 3/25 Enteral vancomycin 3/23 >> Unasyn 3/25>>3/30  SIGNIFICANT EVENTS: 3/21 Admit 3/24 Off pressors, extubated  LINES/TUBES: ETT 3/21 >> 3/24  DVT Prophylaxis  :  xarelto  Lab Results  Component Value Date   PLT 320 10/12/2017    Inpatient Medications  Scheduled Meds: . aspirin EC  81 mg Oral Daily  . atorvastatin  80 mg Oral q1800  . chlorhexidine gluconate (MEDLINE KIT)  15 mL Mouth Rinse BID  . famotidine  20 mg Oral QHS  . ferrous sulfate  325 mg Oral BID WC  . furosemide  40 mg Oral Daily  . gabapentin  600 mg Oral BID  . insulin aspart  0-20 Units Subcutaneous TID WC  . insulin aspart  0-5 Units Subcutaneous QHS  . insulin aspart  3 Units Subcutaneous TID WC  . insulin detemir  10 Units Subcutaneous Daily  . magnesium oxide  400 mg Oral BID  . potassium chloride  40 mEq Oral TID  . protein supplement shake  11 oz Oral Q24H  . rivaroxaban  20 mg Oral Q supper  . vitamin C  250 mg Oral BID   Continuous Infusions: PRN Meds:.acetaminophen, albuterol, calcium carbonate, HYDROmorphone (DILAUDID) injection, ondansetron (ZOFRAN) IV, oxyCODONE, sodium chloride flush    Anti-infectives (From admission, onward)   Start     Dose/Rate Route Frequency Ordered Stop   10/03/17 1800  Ampicillin-Sulbactam (UNASYN) 3 g in sodium chloride 0.9 % 100 mL IVPB  Status:  Discontinued     3 g 200 mL/hr over 30 Minutes Intravenous Every 6 hours 10/03/17 1425 10/08/17 1814   10/01/17 1000  vancomycin (VANCOCIN) 50 mg/mL oral solution 125 mg     125 mg Oral 4 times daily 10/01/17 0835 10/10/17 2206   09/30/17 0800  vancomycin (VANCOCIN) IVPB 1000 mg/200 mL premix  Status:  Discontinued     1,000 mg 200 mL/hr over 60 Minutes Intravenous Every 24 hours  09/29/17 0555 09/29/17 1336   09/30/17 0500  vancomycin (VANCOCIN) 1,250 mg in sodium chloride 0.9 % 250 mL IVPB  Status:  Discontinued     1,250 mg 166.7 mL/hr over 90 Minutes Intravenous Every 24 hours 09/29/17 1336 09/30/17 1252   09/29/17 0530  meropenem (MERREM) 1 g in sodium chloride 0.9 % 100 mL IVPB  Status:  Discontinued     1 g 200 mL/hr over 30 Minutes Intravenous Every 8 hours 09/29/17 0519 10/03/17 1425   09/29/17 0500  vancomycin (VANCOCIN) 2,500 mg in sodium chloride 0.9 % 500 mL IVPB     2,500 mg 250 mL/hr over 120 Minutes Intravenous NOW 09/29/17 0443 09/29/17 0720   09/29/17 0445  ceFEPIme (MAXIPIME) 2 g in sodium chloride 0.9 % 100 mL IVPB  Status:  Discontinued     2 g 200 mL/hr over 30 Minutes Intravenous  Once 09/29/17 0436 09/29/17 0442   09/29/17 0445  vancomycin (VANCOCIN) IVPB 1000 mg/200 mL premix  Status:  Discontinued     1,000 mg 200 mL/hr over 60 Minutes Intravenous  Once 09/29/17 0436 09/29/17 0443   09/29/17 0445  ceFEPIme (MAXIPIME) 2 g in sodium chloride 0.9 % 100 mL IVPB  Status:  Discontinued     2 g 200 mL/hr over 30 Minutes Intravenous STAT 09/29/17 0442 09/29/17 0519        Objective:   Vitals:   10/12/17 2159 10/13/17 0420 10/13/17 1500 10/13/17 1700  BP: (!) 104/46 (!) 124/58 (!) 90/40 (!) 128/53  Pulse: 96 80 72   Resp: 18 18 18    Temp: 98.4 F (36.9 C) 97.9 F (36.6 C) 99.9 F (37.7 C)   TempSrc: Oral Oral Oral   SpO2: 94% 95% 95%   Weight:  123.1 kg (271 lb 6.4 oz)    Height:        Wt Readings from Last 3 Encounters:  10/13/17 123.1 kg (271 lb 6.4 oz)  09/13/17 114.3 kg (252 lb)  04/20/17 (!) 140.6 kg (310 lb)     Intake/Output Summary (Last 24 hours) at 10/13/2017 1959 Last data filed at 10/13/2017 1900 Gross per 24 hour  Intake 960 ml  Output 5630 ml  Net -4670 ml     Physical Exam  Gen:- Awake Alert,  In no apparent distress , obese HEENT:- [place].AT, No sclera icterus Neck-Supple Neck,No JVD,.  Lungs-diminished in  bases, no wheezing CV- S1, S2 normal Abd-  +ve B.Sounds, Abd Soft,  increased truncal adiposity noted,, negative Murphy Extremity/Skin:-Right AKA, extensive sacral/buttock wounds with non viable tissue/areas psych-affect is appropriate, oriented x3 Neuro-no new focal deficits, no tremors GU-scrotal and penile swelling, Foley with clear urine     Data Review:   Micro Results No results found for this or any previous visit (from the past 240 hour(s)).  Radiology Reports Dg Abd 1 View  Result Date: 09/29/2017 CLINICAL DATA:  Nasogastric tube placement EXAM: ABDOMEN - 1 VIEW COMPARISON:  Portable exam 1558 hours compared to CT abdomen and pelvis 09/15/2015 FINDINGS: Tip of nasogastric tube projects over mid stomach. Gas distention of a bowel loop in mid abdomen up to 11.3 cm diameter, question transverse colon. No definite bowel wall thickening. Exclusion of the pelvis and RIGHT lateral aspect of abdomen. Minimal gaseous distention of stomach. Atelectasis versus consolidation in LEFT lower lobe. Bones demineralized. IMPRESSION: Tip of nasogastric tube projects over mid stomach. Gaseous distention of a bowel loop in the LEFT mid abdomen, probably transverse colon, 11.3 cm diameter. Electronically Signed   By: Lavonia Dana M.D.   On: 09/29/2017 16:17   Ct Abdomen Pelvis W Contrast  Result Date: 10/11/2017 CLINICAL DATA:  Abdominal pain. Currently admitted for septic shock, aspiration pneumonia, and C difficile colitis. EXAM: CT ABDOMEN AND PELVIS WITH CONTRAST TECHNIQUE: Multidetector CT imaging of the abdomen and pelvis was performed using the standard protocol following bolus administration of intravenous contrast. CONTRAST:  181m ISOVUE-300 IOPAMIDOL (ISOVUE-300) INJECTION 61% COMPARISON:  CT abdomen pelvis dated September 15, 2015. FINDINGS: Lower chest: Moderate bilateral pleural effusions with consolidation/atelectasis in the left greater than right lower lobes. Hepatobiliary: No focal liver  abnormality. Layering gallbladder sludge with mildly thickened or edematous wall. No biliary dilatation. Pancreas: Unremarkable. No pancreatic ductal dilatation or surrounding inflammatory changes. Spleen: Normal in size without focal abnormality. Adrenals/Urinary Tract: Unchanged 3.2 cm adenoma in the left adrenal gland. The right adrenal gland is unremarkable. No focal renal lesion. No renal or ureteral calculi. No  hydronephrosis. The bladder is decompressed by Foley catheter. Stomach/Bowel: Large amount of stool in the distal sigmoid colon and rectum. Rectal tube in place. Mild gaseous distention of the sigmoid colon. No evidence of obstruction, bowel wall thickening, or surrounding inflammatory changes. The stomach is within normal limits. The appendix is surgically absent. Vascular/Lymphatic: Aortic atherosclerosis. Mildly enlarged portacaval lymph node, measuring up to 1.2 cm, previously 1.9 cm. Reproductive: Prostate is unremarkable. Other: Small ascites.  No pneumoperitoneum. Musculoskeletal: Diffuse anasarca. No acute or significant osseous findings. IMPRESSION: 1. Large amount of stool in the distal sigmoid colon and rectum with mild gaseous distention of the sigmoid colon. Correlate for rectal tube function. No definite evidence of gastroenteritis or colitis. 2. Gallbladder sludge with mild wall thickening. Small amount of pericholecystic fluid could be related to ascites. Correlate for acute cholecystitis and consider right upper quadrant ultrasound for further evaluation as clinically indicated. 3. Moderate bilateral pleural effusions with consolidation/atelectasis in the left greater than right lower lobes. 4. Small ascites and diffuse anasarca. 5.  Aortic atherosclerosis (ICD10-I70.0). Electronically Signed   By: Titus Dubin M.D.   On: 10/11/2017 18:52   US Renal  Result Date: 09/29/2017 CLINICAL DATA:  Acute kidney injury superimposed on chronic renal insufficiency. History of diabetes and  hypertension. EXAM: RENAL / URINARY TRACT ULTRASOUND COMPLETE COMPARISON:  Renal ultrasound of June 10, 2016 FINDINGS: Right Kidney: Length: 12.4 cm. The renal cortical echotexture remains lower than that of the adjacent liver. There is no hydronephrosis. Left Kidney: Length: 13.4 cm. The renal cortical echotexture is similar to that on the right. There is no hydronephrosis. Bladder: The urinary bladder is decompressed by Foley catheter. IMPRESSION: Normal appearance of both kidneys.  No hydronephrosis. Electronically Signed   By: David  Martinique M.D.   On: 09/29/2017 08:22   Dg Chest Port 1 View  Result Date: 10/02/2017 CLINICAL DATA:  Respiratory failure.  Ex-smoker. EXAM: PORTABLE CHEST 1 VIEW COMPARISON:  10/01/2017. FINDINGS: Endotracheal tube in satisfactory position. Nasogastric tube extending into the stomach. Stable enlarged cardiac silhouette. Mild increase in dense airspace opacity at the left lung base. Increased patchy opacity in the right lower lung zone. Stable prominence of the interstitial markings. Thoracic spine degenerative changes. IMPRESSION: 1. Mild further increase in dense left lower lobe atelectasis or pneumonia. 2. Increased probable pneumonia in the right lower lung zone. 3. Stable cardiomegaly and chronic interstitial lung disease. Electronically Signed   By: Claudie Revering M.D.   On: 10/02/2017 07:16   Dg Chest Port 1 View  Result Date: 10/01/2017 CLINICAL DATA:  Respiratory failure. EXAM: PORTABLE CHEST 1 VIEW COMPARISON:  Yesterday. FINDINGS: Stable enlarged cardiac silhouette. Endotracheal tube is unchanged. Nasogastric tube extending into the stomach. Increased left lower lobe airspace opacity. No significant change in patchy opacity in the right lower lung zone and inferior perihilar region. The interstitial markings remain prominent. Thoracic spine degenerative changes. IMPRESSION: 1. Increased left lower lobe atelectasis or pneumonia. 2. No significant change in right  lower lung zone atelectasis and possible pneumonia. 3. Stable cardiomegaly and chronic interstitial lung disease. Electronically Signed   By: Claudie Revering M.D.   On: 10/01/2017 07:13   Dg Chest Port 1 View  Result Date: 09/30/2017 CLINICAL DATA:  Check endotracheal tube position EXAM: PORTABLE CHEST 1 VIEW COMPARISON:  09/29/2017 FINDINGS: Endotracheal tube and nasogastric catheter are noted in satisfactory position. Cardiac shadow is mildly prominent but stable. Persistent perihilar changes are noted although slightly improved when compared with the prior exam. No new  focal infiltrate is seen. IMPRESSION: Slight improved aeration. Tubes and lines as described. Electronically Signed   By: Inez Catalina M.D.   On: 09/30/2017 06:56   Dg Chest Port 1 View  Result Date: 09/29/2017 CLINICAL DATA:  Intubation. EXAM: PORTABLE CHEST 1 VIEW COMPARISON:  Radiograph 09/11/2017 FINDINGS: Endotracheal tube is 3.4 cm from the carina. Low lung volumes. The heart is enlarged, similar to prior exam. New bilateral perihilar opacities. No large pleural effusion or pneumothorax. IMPRESSION: 1. Endotracheal tube 3.4 cm from the carina. 2. Cardiomegaly. Bilateral perihilar opacities may be pulmonary edema, aspiration or pneumonia. Electronically Signed   By: Jeb Levering M.D.   On: 09/29/2017 04:41   US Abdomen Limited Ruq  Result Date: 10/12/2017 CLINICAL DATA:  67 year old male with abnormal gallbladder on recent CT. Recent abdominal pain. EXAM: ULTRASOUND ABDOMEN LIMITED RIGHT UPPER QUADRANT COMPARISON:  CT Abdomen and Pelvis 14 19 end earlier. FINDINGS: Gallbladder: Gallbladder wall thickness is at the upper limits of normal, 3 millimeters. Possible sludge, but no echogenic stones are identified within the gallbladder lumen. No pericholecystic fluid identified. No sonographic Murphy sign elicited. Common bile duct: Diameter: 4 millimeters, normal Liver: Generalized increased liver echogenicity (image 42). No discrete  liver lesion. No intrahepatic biliary ductal dilatation. Portal vein is patent on color Doppler imaging with normal direction of blood flow towards the liver. Other findings: Trace perihepatic free fluid (image 19). Partially visible right pleural effusion (image 20). IMPRESSION: 1. No cholelithiasis or evidence of acute cholecystitis. 2. No evidence of acute biliary obstruction. 3. Fatty liver disease with trace perihepatic free fluid. Partially visible right pleural effusion. Electronically Signed   By: Genevie Ann M.D.   On: 10/12/2017 15:20     CBC Recent Labs  Lab 10/07/17 0454 10/09/17 1834 10/10/17 0524 10/12/17 0704  WBC 14.9* 15.8* 15.6* 15.5*  HGB 8.3* 7.5* 7.8* 8.3*  HCT 27.4* 23.7* 25.6* 26.9*  PLT 350 299 317 320  MCV 76.3* 76.9* 77.6* 78.4  MCH 23.1* 24.4* 23.6* 24.2*  MCHC 30.3 31.6 30.5 30.9  RDW 23.3* 23.7* 23.7* 22.8*  LYMPHSABS  --  2.7  --  2.2  MONOABS  --  0.9  --  1.4*  EOSABS  --  0.3  --  0.3  BASOSABS  --  0.0  --  0.0    Chemistries  Recent Labs  Lab 10/07/17 0454 10/09/17 1834 10/10/17 0524 10/11/17 0538 10/12/17 0704 10/12/17 0706 10/13/17 1149  NA 142 141  --  139 140  --  140  K 3.2* 2.6* 3.1* 4.0 3.2*  --  3.8  CL 111 110  --  107 106  --  105  CO2 24 24  --  24 27  --  29  GLUCOSE 159* 169*  --  242* 147*  --  91  BUN 8 7  --  7 7  --  7  CREATININE 0.76 0.64  --  0.65 0.63  --  0.67  CALCIUM 6.9* 6.6*  --  7.0* 7.1*  --  7.5*  MG  --   --  1.2* 1.6*  --  1.6*  --    ------------------------------------------------------------------------------------------------------------------ No results for input(s): CHOL, HDL, LDLCALC, TRIG, CHOLHDL, LDLDIRECT in the last 72 hours.  Lab Results  Component Value Date   HGBA1C 6.1 (H) 10/11/2017   ------------------------------------------------------------------------------------------------------------------ No results for input(s): TSH, T4TOTAL, T3FREE, THYROIDAB in the last 72 hours.  Invalid  input(s): FREET3 ------------------------------------------------------------------------------------------------------------------ No results for input(s): VITAMINB12, FOLATE, FERRITIN, TIBC,  IRON, RETICCTPCT in the last 72 hours.  Coagulation profile No results for input(s): INR, PROTIME in the last 168 hours.  No results for input(s): DDIMER in the last 72 hours.  Cardiac Enzymes No results for input(s): CKMB, TROPONINI, MYOGLOBIN in the last 168 hours.  Invalid input(s): CK ------------------------------------------------------------------------------------------------------------------    Component Value Date/Time   BNP 1,401.6 (H) 09/01/2017 0237   BNP 130.0 (H) 03/11/2016 1445     Yuleimy Kretz M.D on 10/13/2017 at 7:59 PM  Between 7am to 7pm - Pager - [phone]  After 7pm go to [email or web address] - password [email or web address]  [name] -  Office  [phone]   Voice Recognition Viviann Spare dictation system was used to create this note, attempts have been made to correct errors. Please contact the author with questions and/or clarifications.

## 2017-10-13 NOTE — Progress Notes (Signed)
Nutrition Follow-up  DOCUMENTATION CODES:   Obesity unspecified  INTERVENTION:  - Diet re-advancement as medically appropriate. - Will continue Premier Protein once/day at this time and will continue to monitor for need to adjust ONS. - Continue to encourage PO intakes.   NUTRITION DIAGNOSIS:   Increased nutrient needs related to acute illness, wound healing as evidenced by estimated needs. -ongoing  GOAL:   Patient will meet greater than or equal to 90% of their needs -unmet on average.   MONITOR:   PO intake, Supplement acceptance, Weight trends, Labs, Skin  ASSESSMENT:   67 yo male with PMH of DM, morbid obesity, Afib, OSA, anemia, HTN, HLD, GIB, CHF, CAD, R-AKA, and recent ESBL Ecoli Bacteremia. He presented to the ED from nursing home. EMS was called for N/V and when they arrived they found patient hypoxic and minimally responsive. He was emergently intubated in the ED and found to be in septic shock requiring vasopressors.  Per chart review, weight fluctuations since admission. Most recently he lost 7 lbs/3.2 kg from yesterday to today. Pt receiving 40 mg oral Lasix/day starting 4/2. Will monitor weight trends closely. Per review of chart, he consumed 25% of breakfast on 4/2 and 50% of all meals yesterday.   Pt with ongoing C. Diff colitis although stool more formed and flexi now out. Dr. Darci Needle note yesterday states that pt with CHF has LLE and scrotal edema and plan to start daily K supplementation with starting Lasix. Surgery consulted to evaluate for need for cholecystectomy and HIDA and ultrasound ordered for this AM. Pt has been NPO since midnight in anticipation of these studies.   Medications reviewed; 325 mg ferrous sulfate BID, 40 mg oral Lasix/day, sliding scale Novolog, 3 units Novolog TID, 10 units Levemir/day, 400 mg Mag-ox BID, 3 g IV Mg sulfate x1 run yesterday, 40 mEq oral KC TID, 250 mg ascorbic acid BID. Labs reviewed; CBG: 149 mg/dL this AM, K: 3.2 mmol/L,  Ca: 7.1 mg/dL, Mg: 1.6 mg/dL.    Diet Order:  Diet NPO time specified Except for: Sips with Meds  EDUCATION NEEDS:   No education needs have been identified at this time  Skin:  Skin Assessment: Skin Integrity Issues: Skin Integrity Issues:: Unstageable Unstageable: full thickness sacrum Incisions: bilateral scrotum 3/28; R AKA on 2/21 (healed)  Last BM:  4/3  Height:   Ht Readings from Last 1 Encounters:  10/05/17 6' (1.829 m)    Weight:   Wt Readings from Last 1 Encounters:  10/13/17 271 lb 6.4 oz (123.1 kg)    Ideal Body Weight:  80.91 kg  BMI:  Body mass index is 36.81 kg/m.  Estimated Nutritional Needs:   Kcal:  2100-2330 (18-20 kcal/kg)  Protein:  130-140 grams  Fluid:  >/= 2.1 L/day      Trenton Gammon, MS, RD, LDN, [facility] Inpatient Clinical Dietitian Pager # [phone] After hours/weekend pager # [phone]

## 2017-10-14 LAB — CBC
HCT: 26.3 % — ABNORMAL LOW (ref 39.0–52.0)
Hemoglobin: 8.1 g/dL — ABNORMAL LOW (ref 13.0–17.0)
MCH: 24.3 pg — ABNORMAL LOW (ref 26.0–34.0)
MCHC: 30.8 g/dL (ref 30.0–36.0)
MCV: 79 fL (ref 78.0–100.0)
PLATELETS: 274 10*3/uL (ref 150–400)
RBC: 3.33 MIL/uL — AB (ref 4.22–5.81)
RDW: 22 % — ABNORMAL HIGH (ref 11.5–15.5)
WBC: 10.7 10*3/uL — ABNORMAL HIGH (ref 4.0–10.5)

## 2017-10-14 LAB — BASIC METABOLIC PANEL
Anion gap: 6 (ref 5–15)
Anion gap: 8 (ref 5–15)
BUN: 9 mg/dL (ref 6–20)
BUN: 9 mg/dL (ref 6–20)
CALCIUM: 7.8 mg/dL — AB (ref 8.9–10.3)
CO2: 28 mmol/L (ref 22–32)
CO2: 27 mmol/L (ref 22–32)
CREATININE: 0.81 mg/dL (ref 0.61–1.24)
Calcium: 7.7 mg/dL — ABNORMAL LOW (ref 8.9–10.3)
Chloride: 101 mmol/L (ref 101–111)
Chloride: 99 mmol/L — ABNORMAL LOW (ref 101–111)
Creatinine, Ser: 0.87 mg/dL (ref 0.61–1.24)
GFR calc Af Amer: >60 mL/min (ref >60)
GFR calc Af Amer: >60 mL/min (ref >60)
GLUCOSE: 191 mg/dL — AB (ref 65–99)
Glucose, Bld: 214 mg/dL — ABNORMAL HIGH (ref 65–99)
POTASSIUM: 4.7 mmol/L (ref 3.5–5.1)
Potassium: 4.9 mmol/L (ref 3.5–5.1)
SODIUM: 135 mmol/L (ref 135–145)
SODIUM: 134 mmol/L — AB (ref 135–145)

## 2017-10-14 LAB — GLUCOSE, CAPILLARY
GLUCOSE-CAPILLARY: 200 mg/dL — AB (ref 65–99)
GLUCOSE-CAPILLARY: 218 mg/dL — AB (ref 65–99)
Glucose-Capillary: 116 mg/dL — ABNORMAL HIGH (ref 65–99)
Glucose-Capillary: 117 mg/dL — ABNORMAL HIGH (ref 65–99)

## 2017-10-14 MED ORDER — INSULIN DETEMIR 100 UNIT/ML ~~LOC~~ SOLN: 10.0000 [IU] | Freq: Every day | SUBCUTANEOUS | 11 refills | Status: AC

## 2017-10-14 MED ORDER — MAGNESIUM OXIDE 400 (241.3 MG) MG PO TABS: 400.0000 mg | ORAL_TABLET | Freq: Two times a day (BID) | ORAL | 1 refills | Status: AC

## 2017-10-14 MED ORDER — METOPROLOL TARTRATE 25 MG PO TABS: 25.0000 mg | ORAL_TABLET | Freq: Two times a day (BID) | ORAL | 2 refills | Status: AC

## 2017-10-14 MED ORDER — CALCIUM CARBONATE ANTACID 500 MG PO CHEW: 2.0000 | CHEWABLE_TABLET | Freq: Two times a day (BID) | ORAL | 1 refills | Status: AC | PRN

## 2017-10-14 MED ORDER — PREMIER PROTEIN SHAKE: 11.0000 [oz_av] | ORAL | 1 refills | Status: AC

## 2017-10-14 MED ORDER — ASCORBIC ACID 250 MG PO TABS: 250.0000 mg | ORAL_TABLET | Freq: Two times a day (BID) | ORAL | 1 refills | Status: AC

## 2017-10-14 MED ORDER — ALBUTEROL SULFATE (2.5 MG/3ML) 0.083% IN NEBU: 2.5000 mg | INHALATION_SOLUTION | RESPIRATORY_TRACT | 12 refills | Status: AC | PRN

## 2017-10-14 MED ORDER — ACETAMINOPHEN 325 MG PO TABS: 650.0000 mg | ORAL_TABLET | ORAL | 1 refills | Status: DC | PRN

## 2017-10-14 MED ORDER — FAMOTIDINE 20 MG PO TABS: 20.0000 mg | ORAL_TABLET | Freq: Every day | ORAL | 2 refills | Status: AC

## 2017-10-14 MED ORDER — FUROSEMIDE 20 MG PO TABS: 20.0000 mg | ORAL_TABLET | Freq: Every day | ORAL | 1 refills | Status: AC

## 2017-10-14 MED ORDER — OXYCODONE HCL 20 MG PO TABS: 20.0000 mg | ORAL_TABLET | ORAL | 0 refills | Status: AC | PRN

## 2017-10-14 NOTE — Progress Notes (Signed)
Assumed care of pt from previous nurse. Agree with previous nurses assessment. Will continue to monitor.  

## 2017-10-14 NOTE — Progress Notes (Signed)
Pt had an order to remove foley. MD and RN examed patient's scrotum, penis, and pressure ulcer on his sacrum together to assess actual need for removal of foley. MD explained to patient the risks of keeping the foley and removing the foley. Due to the severity of patient's pressure ulcer and the fact that patient is incontinent of bowel and urine he let patient know it would be difficult for wound to stay dry and heal the way it needed to. He also explained he's more at risk for infection in blood by keeping foley catheter as well. Patient told MD that he would rather keep the foley catheter in if it meant it would help heal his wound knowing all information MD talked with patient about. Patient still currently had foley catheter.  Rockne Coons, RN

## 2017-10-14 NOTE — NC FL2 (Signed)
[place] LEVEL OF CARE SCREENING TOOL     IDENTIFICATION  Patient Name: [name] Birthdate: [birth year] Sex: male Admission Date (Current Location): 09/29/2017  [facility] and [place] Number:  Herbalist and Address:  [facility] [place], [place] [address] [place], [place] [address]      Provider Number: [id number]  Attending Physician Name and Address:  Roxan Hockey, MD  Relative Name and Phone Number:       Current Level of Care: Hospital Recommended Level of Care: [place] Prior Approval Number:    Date Approved/Denied:   PASRR Number: [id number]  Discharge Plan: SNF    Current Diagnoses: Patient Active Problem List   Diagnosis Date Noted  . C. difficile diarrhea 10/12/2017  . Hypomagnesemia 10/12/2017  . Septic shock ([place]) 09/29/2017  . Acute encephalopathy   . Palliative care encounter   . UTI (urinary tract infection) 09/01/2017  . Acute metabolic encephalopathy 02/77/4128  . S/P AKA (above knee amputation) unilateral, right ([place]) 09/01/2017  . Atrial fibrillation with rapid ventricular response ([place])   . Hyponatremia 04/11/2017  . ARF (acute renal failure) ([place]) 04/11/2017  . Pressure injury of skin 03/24/2017  . Below knee amputation status, right ([place]) 03/23/2017  . DKA (diabetic ketoacidoses) ([place]) 03/14/2017  . Elevated troponin 03/14/2017  . Diabetic polyneuropathy associated with type 2 diabetes mellitus ([place])   . Osteoarthritis of both knees   . Diabetic nephropathy associated with type 2 diabetes mellitus ([place])   . Bilateral lower leg cellulitis 01/22/2017  . Venous stasis 08/06/2016  . At high risk for falls 07/21/2016  . CHF exacerbation ([place]) 06/10/2016  . Lactic acidosis 06/09/2016  . Syncope and collapse 03/05/2016  . Acute on chronic renal insufficiency 03/05/2016  . Obesity 12/30/2015  . Scrotal swelling   . Foot fracture   . 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 Lower GI bleed   . Pressure ulcer 12/24/2015  . Acute kidney injury ([place]) 12/23/2015  . Back pain 12/23/2015  . Fall   . Metatarsal fracture   . Traumatic rhabdomyolysis ([place])   . Hyperkalemia   . Lisfranc dislocation   . Hyperglycemia   . Sepsis, unspecified organism ([place]) 09/08/2015  . Scrotal wall abscess 09/08/2015  . AKI (acute kidney injury) ([place]) 09/08/2015  . Chest wall pain   . On amiodarone therapy 12/08/2014  . Respiratory failure with hypoxia ([place]) 12/04/2014  . Hypoxia 12/04/2014  . Acute on chronic respiratory failure with hypoxia ([place])   . Atrial fibrillation with RVR ([place]) 09/20/2014  . Acute on chronic diastolic CHF (congestive heart failure) ([place]) 09/19/2014  . Acute respiratory failure ([place]) 09/19/2014  . Abnormal nuclear cardiac imaging test 02/12/2014  . Dyspnea 02/12/2014  . Chronic anticoagulation- Coumadin 02/12/2014  . CKD (chronic kidney disease) stage 3, GFR 30-59 ml/min (H[facility]) 02/12/2014  . Chronic diastolic congestive heart failure ([place]) 02/11/2014  . Encounter for therapeutic drug monitoring 08/03/2013  . Pilonidal cyst 01/25/2013  . Perirectal cellulitis 09/06/2012  . Long term (current) use of anticoagulants 11/18/2011  . Obstructive sleep apnea- unable to tolerate c-pap 08/04/2011  . CAD S/P percutaneous coronary angioplasty 08/04/2011  . ANEMIA-IRON DEFICIENCY 01/06/2009  . OTHER AND UNSPECIFIED COAGULATION DEFECTS 01/06/2009  . Anemia 01/02/2009  . CARDIOVASCULAR FUNCTION STUDY, ABNORMAL 01/02/2009  . Essential hypertension, benign 10/22/2008  . Atrial fibrillation 10/22/2008  . Hyperlipidemia 09/23/2008  . Hypokalemia 09/23/2008  . Obesity, Class III, BMI 40-49.9 (  morbid obesity) ([place]) 09/23/2008  . CELLULITIS AND ABSCESS OF LEG EXCEPT FOOT 09/23/2008    Orientation RESPIRATION BLADDER Height & Weight     Self, Time, Situation, Place  Normal Continent Weight: 268 lb 1.3 oz (121.6 kg) Height:  6' (182.9 cm)  BEHAVIORAL  SYMPTOMS/MOOD NEUROLOGICAL BOWEL NUTRITION STATUS      (rectal tube) Diet(carb modified diet)  AMBULATORY STATUS COMMUNICATION OF NEEDS Skin   Extensive Assist Verbally((does not speak much)) PU Stage and Appropriate Care, Skin abrasions, Surgical wounds(unstageable pressure injury sacrum, daily dressing changes, gauze dressing, left foot skin abrasion, closed incision scrotum)                       Personal Care Assistance Level of Assistance  Bathing, Feeding, Dressing Bathing Assistance: Limited assistance Feeding assistance: Limited assistance Dressing Assistance: Limited assistance     Functional Limitations Info  Sight, Hearing, Speech Sight Info: Adequate Hearing Info: Adequate Speech Info: Adequate    SPECIAL CARE FACTORS FREQUENCY  PT (By licensed PT), OT (By licensed OT)     PT Frequency: 3x OT Frequency: 3x            Contractures Contractures Info: Not present    Additional Factors Info  Allergies, Isolation Precautions Code Status Info: full code Allergies Info: nka     Isolation Precautions Info: contact precautions, MRSA, ESBL     Current Medications (10/14/2017):  This is the current hospital active medication list Current Facility-Administered Medications  Medication Dose Route Frequency Provider Last Rate Last Dose  . acetaminophen (TYLENOL) tablet 650 mg  650 mg Oral Q4H PRN Hosie Poisson, MD   650 mg at 10/02/17 0947  . albuterol (PROVENTIL) (2.5 MG/3ML) 0.083% nebulizer solution 2.5 mg  2.5 mg Nebulization Q4H PRN Hosie Poisson, MD      . aspirin EC tablet 81 mg  81 mg Oral Daily Hosie Poisson, MD   81 mg at 10/14/17 1051  . atorvastatin (LIPITOR) tablet 80 mg  80 mg Oral q1800 Hosie Poisson, MD   80 mg at 10/13/17 1814  . calcium carbonate (TUMS - dosed in mg elemental calcium) chewable tablet 400 mg of elemental calcium  2 tablet Oral BID PRN Arby Barrette A, NP   400 mg of elemental calcium at 10/09/17 2145  . chlorhexidine gluconate  (MEDLINE KIT) (PERIDEX) 0.12 % solution 15 mL  15 mL Mouth Rinse BID Hosie Poisson, MD   15 mL at 10/14/17 0756  . famotidine (PEPCID) tablet 20 mg  20 mg Oral QHS Hosie Poisson, MD   20 mg at 10/13/17 2018  . ferrous sulfate tablet 325 mg  325 mg Oral BID WC Hosie Poisson, MD   325 mg at 10/14/17 0755  . furosemide (LASIX) tablet 40 mg  40 mg Oral Daily Hosie Poisson, MD   40 mg at 10/14/17 1051  . gabapentin (NEURONTIN) capsule 600 mg  600 mg Oral BID Hosie Poisson, MD   600 mg at 10/14/17 1051  . HYDROmorphone (DILAUDID) injection 1 mg  1 mg Intravenous Q3H PRN Roxan Hockey, MD   1 mg at 10/14/17 1339  . insulin aspart (novoLOG) injection 0-20 Units  0-20 Units Subcutaneous TID WC Hosie Poisson, MD   7 Units at 10/14/17 1246  . insulin aspart (novoLOG) injection 0-5 Units  0-5 Units Subcutaneous QHS Hosie Poisson, MD   2 Units at 10/11/17 2250  . insulin aspart (novoLOG) injection 3 Units  3 Units Subcutaneous TID WC Hosie Poisson,  MD   3 Units at 10/14/17 1246  . insulin detemir (LEVEMIR) injection 10 Units  10 Units Subcutaneous Daily Hosie Poisson, MD   10 Units at 10/14/17 1126  . magnesium oxide (MAG-OX) tablet 400 mg  400 mg Oral BID Hosie Poisson, MD   400 mg at 10/14/17 1051  . ondansetron (ZOFRAN) injection 4 mg  4 mg Intravenous Q6H PRN Hosie Poisson, MD      . oxyCODONE (Oxy IR/ROXICODONE) immediate release tablet 10 mg  10 mg Oral Q4H PRN Hosie Poisson, MD   10 mg at 10/14/17 1051  . potassium chloride SA (K-DUR,KLOR-CON) CR tablet 40 mEq  40 mEq Oral TID Hosie Poisson, MD   40 mEq at 10/14/17 1052  . protein supplement (PREMIER PROTEIN) liquid  11 oz Oral Q24H Hosie Poisson, MD   11 oz at 10/14/17 1052  . rivaroxaban (XARELTO) tablet 20 mg  20 mg Oral Q supper Eudelia Bunch, RPH   20 mg at 10/13/17 1814  . sodium chloride flush (NS) 0.9 % injection 10-40 mL  10-40 mL Intracatheter PRN Hosie Poisson, MD   10 mL at 10/12/17 0909  . vitamin C (ASCORBIC ACID) tablet 250 mg  250 mg  Oral BID Hosie Poisson, MD   250 mg at 10/14/17 1052     Discharge Medications: Please see discharge summary for a list of discharge medications.  Relevant Imaging Results:  Relevant Lab Results:   Additional Information SS# [id number]  palliative to follow  Servando Snare, LCSW

## 2017-10-14 NOTE — Progress Notes (Signed)
Patient returning to [facility] [place].  D/C Summary sent to [facility] arranged for transport 4:57pm. ETA [facility] unknown.  Nurse given transport packet and number to call report.  Vivi Barrack, Theresia Majors, MSW Clinical Social Worker  [phone]

## 2017-10-14 NOTE — Discharge Instructions (Signed)
1) wound care consult please- Patient Needs very extensive wound care to his sacral wounds 2) keep Foley catheter in place placed to allow the right eye deep and severe sacral wounds with nonviable areas to heal as patient is incontinent otherwise 3) repeat CBC and BMP in 1 week 4) use special mattress to reduce decubiti risk place

## 2017-10-14 NOTE — Discharge Summary (Signed)
[name], is a 67 y.o. male  DOB 05-19-1951  MRN [id number].  Admission date:  09/29/2017  Admitting Physician  Gigi Gin, MD  Discharge Date:  10/14/2017   Primary MD  [name], MD (Inactive)  Recommendations for primary care physician for things to follow:   1) wound care consult please- Patient Needs very extensive wound care to his sacral wounds 2) keep Foley catheter in place placed to allow the right eye deep and severe sacral wounds with nonviable areas to heal as patient is incontinent otherwise 3) repeat CBC and BMP in 1 week 4) use special mattress to reduce decubiti risk place   Admission Diagnosis  Hypokalemia [E87.6] Hypomagnesemia [E83.42] Vomiting [R11.10] Microcytic anemia [D50.9] Hypoglycemia [E16.2] Acute kidney injury (nontraumatic) (HCC) [N17.9] AKI (acute kidney injury) (HCC) [N17.9] HCAP (healthcare-associated pneumonia) [J18.9] Sepsis, due to unspecified organism (HCC) [A41.9] Septic shock (HCC) [A41.9, R65.21]   Discharge Diagnosis  Hypokalemia [E87.6] Hypomagnesemia [E83.42] Vomiting [R11.10] Microcytic anemia [D50.9] Hypoglycemia [E16.2] Acute kidney injury (nontraumatic) (HCC) [N17.9] AKI (acute kidney injury) (HCC) [N17.9] HCAP (healthcare-associated pneumonia) [J18.9] Sepsis, due to unspecified organism (HCC) [A41.9] Septic shock (HCC) [A41.9, R65.21]   Active Problems:   Hypokalemia   Anemia   Essential hypertension, benign   Atrial fibrillation   Chronic diastolic congestive heart failure (HCC)   CKD (chronic kidney disease) stage 3, GFR 30-59 ml/min (HCC)   Respiratory failure with hypoxia (HCC)   Scrotal swelling   Below knee amputation status, right (HCC)   S/P AKA (above knee amputation) unilateral, right (HCC)   Septic shock (HCC)   C. difficile diarrhea   Hypomagnesemia      Past Medical History:  Diagnosis Date  .  Arthritis    "hands and lower back" (09/19/2014)  . CAD (coronary artery disease)    a. s/p PCI to RCA in 2012. b. prior cath in 01/2014 with elevated L/RH pressures, mild-mod CAD of LAD/LCx with patent RCA, normal EF, c/b CIN/CHF.  [name] Carpal tunnel syndrome, bilateral   . Cellulitis   . Chronic diastolic CHF (congestive heart failure) (HCC)   . Chronic kidney disease (CKD), stage III (moderate) (HCC)   . Chronic lower back pain   . Diabetes (HCC)   . DKA (diabetic ketoacidoses) (HCC) 03/2017  . History of blood transfusion ~ 1954   "related to OR"  . Hyperlipidemia   . Hypertension   . Hypoxia    a. Qualified for home O2 at [place] in 09/2014.  [name] Lower GI bleed   . Microcytic anemia   . Morbid obesity (HCC)   . Neuropathy   . OSA (obstructive sleep apnea)    "I wear nasal prongs; haven't been using prongs recently" (09/19/2014)  . PAF (paroxysmal atrial fibrillation) (HCC)    TEE DCCV 09/23/2014  . Physical deconditioning   . Pilonidal cyst 1980's; 01/25/2013  . Scrotal abscess   . Type II diabetes mellitus (HCC)     Past Surgical History:  Procedure Laterality Date  . ABDOMINAL  SURGERY  ~ 1954   BENIGN TUMOR REMOVED  . AMPUTATION Right 03/23/2017   Procedure: RIGHT BELOW KNEE AMPUTATION;  Surgeon: Nadara Mustard, MD;  Location: [facility] OR;  Service: Orthopedics;  Laterality: Right;  . APPENDECTOMY    . BELOW KNEE LEG AMPUTATION Right 03/23/2017  . CARDIOVERSION  2010   [name] 09/19/2014  . CARDIOVERSION N/A 09/23/2014   Procedure: CARDIOVERSION;  Surgeon: Lewayne Bunting, MD;  Location: [facility] ENDOSCOPY;  Service: Cardiovascular;  Laterality: N/A;  . CARDIOVERSION N/A 12/11/2014   Procedure: CARDIOVERSION;  Surgeon: Thurmon Fair, MD;  Location: [facility] ENDOSCOPY;  Service: Cardiovascular;  Laterality: N/A;  . CATARACT EXTRACTION W/PHACO Right 11/15/2012   Procedure: CATARACT EXTRACTION PHACO AND INTRAOCULAR LENS PLACEMENT (IOC);  Surgeon: Shade Flood, MD;  Location: [facility] OR;  Service: Ophthalmology;   Laterality: Right;  . CATARACT EXTRACTION W/PHACO Left 11/29/2012   Procedure: CATARACT EXTRACTION PHACO AND INTRAOCULAR LENS PLACEMENT (IOC);  Surgeon: Shade Flood, MD;  Location: [facility] OR;  Service: Ophthalmology;  Laterality: Left;  . CORONARY ANGIOPLASTY WITH STENT PLACEMENT  August 2012   RCA DES - [facility]  . DEBRIDEMENT  FOOT Left    debriding diabetic foot ulcers  . EYE SURGERY    . FOREIGN BODY REMOVAL Right 2014   heel,  splinter removal   . LEFT AND RIGHT HEART CATHETERIZATION WITH CORONARY ANGIOGRAM N/A 01/31/2014   Procedure: LEFT AND RIGHT HEART CATHETERIZATION WITH CORONARY ANGIOGRAM;  Surgeon: Micheline Chapman, MD;  Location: [facility] CATH LAB;  Service: Cardiovascular;  Laterality: N/A;  . PARS PLANA VITRECTOMY Left 06/05/2013   Procedure: PARS PLANA VITRECTOMY WITH 23 GAUGE with Endolaser(constellation);  Surgeon: Shade Flood, MD;  Location: [facility] OR;  Service: Ophthalmology;  Laterality: Left;  . PILONIDAL CYST EXCISION N/A 01/08/2013   Procedure: CYST EXCISION PILONIDAL EXTENSIVE;  Surgeon: Axel Filler, MD;  Location: [facility] OR;  Service: General;  Laterality: N/A;  . PILONIDAL CYST EXCISION  1980's   "in [place]"  . TEE WITHOUT CARDIOVERSION N/A 09/23/2014   Procedure: TRANSESOPHAGEAL ECHOCARDIOGRAM (TEE);  Surgeon: Lewayne Bunting, MD;  Location: [facility] ENDOSCOPY;  Service: Cardiovascular;  Laterality: N/A;  . TONSILLECTOMY         HPI  from the history and physical done on the day of admission:   CHIEF COMPLAINT:  Septic shock, VDRF  HISTORY OF PRESENT ILLNESS:   66yoM with DM, Morbid obesity, Afib, OSA, Anemia, HTN, HLD, hx GIB, Diastolic CHF, CAD, R-AKA, and recent ESBL Ecoli Bacteremia, who presented to the ER tonight from his nursing home. EMS was called for N/V and when they arrived they found patient hypoxic (Pox 47% on 2L O2) and minimally responsive. Brought to ER where he was emergently intubated and found to be in septic shock requiring  vasopressors. He was pancultured and started on broad spectrum antibiotics. At time of my exam there is now family in the ER, and patient is intubated/sedated.     Hospital Course:     Brief Narrative:  67 yo male former smoker from [facility] with vomiting, altered mental status, hypoxia. PMHx of CAD, diastolic CHF, CKD 3, DM, HLD, HTN, OSA, Neuropathy, GI bleeding, A fib, PAD s/p Rt AKA, ESBL E coli bacteremia. Intubated in ER and started on pressors for septic shock with concern for aspiration pneumonia. Found to have severe hypokalemia. Found to also have C diff. He was stabilized in the ICU. Extubated. Transferred to stepdown unit. Currently he is on [facility] service on  the floor.   Assessment & Plan:   Active Problems:   Septic shock (HCC)   1)Septic shock secondary to Aspiration Pneumonitis, urinary tract infection and  C diff colitis-patient did require intubation (extubated 10/02/17) and pressors, patient is now hemodynamically stable at this time without pressures.  No hypoxia, afebrile white count is down to 10.5.  Patient has completed antibiotics for aspiration pneumonitis UTI and C. difficile colitis  2) extensive sacral decubiti-   patient will need special mattress, extensive wound care, and wound care consult and vigilant, aggressive and watchful management of this rather extensive sacral and buttock wounds to increase the chances of healing.  His Foley catheter has been left in to help with healing of his wounds as patient is otherwise very very incontinent  3)HFpEF- H/o diastolic heart failure: chronic, and has left leg edema and scrotal edema which is improved somewhat continue low-dose Lasix, watch creatinine closely  4)History of obstructive sleep apnea requiring CPAP at night-CPAP nightly advised  5) Acinetobacter and VRE urinary tract infection -  Completed 5 days of Unasyn.   6)C. difficile colitis -patient completed oral vancomycin  course.    No  further diarrhea  7)Abdominal pain , crampy, non radiating not associated with nausea, or vomiting,--abdominal ultrasound without definite evidence for cholecystitis -surgical consult appreciated, patient refused HIDA scan, per surgical service acute cholecystitis less likely.  Patient eating and drinking well without further nausea vomiting abdominal pain.   8)Blood culture one bottle showing clostridium species discussed with Dr Luciana Axe, suggested its probably contaminant.  Patient does not look septic at this time no fevers white count is down to 10.7  9)PVCs/nonsustained VT/history of arrhythmia/chronic A Fib-no further episodes, echocardiogram showed normal systolic function monitor on telemetry.  Asymptomatic.  Continue metoprolol 25 mg twice daily, continue Xarelto  10) acute on chronic anemia-at baseline Patient has anemia of chronic disease, did receive transfusion, hemoglobin currently stable above 8  11)DM-had hypoglycemic episode, Lantus insulin decreased to 10 units daily, okay to use sliding scale, last A1c 6.1  Disposition Plan: discharge SNF   Consultants:   Surgery.    STUDIES:  Renal u/s 3/21 >> normal kidneys Echo 3/22 >> mild LVH, EF 55 to 60%  CULTURES: Blood 3/21 >>clostridium in one bottle  Urine 3/21 >>Enterococcus, Acinetobacter >> C diff PCR 3/22 >> Ag positive, toxin negative, PCR positive  ANTIBIOTICS: Vancomycin 3/21 >> 3/22 Meropenem 3/21 >> 3/25 Enteral vancomycin 3/23 >> Unasyn 3/25>>3/30  SIGNIFICANT EVENTS: 3/21 Admit 3/24 Off pressors, extubated  LINES/TUBES: ETT 3/21 >> 3/24   Discharge Condition: stable  Follow UP--- wound care specialist   Diet and Activity recommendation:  As advised  Discharge Instructions     Discharge Instructions    (HEART FAILURE PATIENTS) Call MD:  Anytime you have any of the following symptoms: 1) 3 pound weight gain in 24 hours or 5 pounds in 1 week 2) shortness of breath, with or without  a dry hacking cough 3) swelling in the hands, feet or stomach 4) if you have to sleep on extra pillows at night in order to breathe.   Complete by:  As directed    Call MD for:  difficulty breathing, headache or visual disturbances   Complete by:  As directed    Call MD for:  persistant dizziness or light-headedness   Complete by:  As directed    Call MD for:  persistant nausea and vomiting   Complete by:  As directed    Call MD  for:  redness, tenderness, or signs of infection (pain, swelling, redness, odor or green/yellow discharge around incision site)   Complete by:  As directed    Call MD for:  severe uncontrolled pain   Complete by:  As directed    Call MD for:  temperature >100.4   Complete by:  As directed    Diet - low sodium heart healthy   Complete by:  As directed    Discharge instructions   Complete by:  As directed    1) wound care consult please- Patient Needs very extensive wound care to his sacral wounds 2) keep Foley catheter in place placed to allow the right eye deep and severe sacral wounds with nonviable areas to heal as patient is incontinent otherwise 3) repeat CBC and BMP in 1 week 4) use special mattress to reduce decubiti risk place   Increase activity slowly   Complete by:  As directed         Discharge Medications     Allergies as of 10/14/2017   No Known Allergies     Medication List    STOP taking these medications   diltiazem 120 MG tablet Commonly known as:  CARDIZEM   metoprolol succinate 50 MG 24 hr tablet Commonly known as:  TOPROL-XL   potassium chloride SA 20 MEQ tablet Commonly known as:  K-DUR,KLOR-CON     TAKE these medications   acetaminophen 500 MG tablet Commonly known as:  TYLENOL Take 500 mg by mouth every 6 (six) hours as needed for mild pain. What changed:  Another medication with the same name was added. Make sure you understand how and when to take each.   acetaminophen 325 MG tablet Commonly known as:   TYLENOL Take 2 tablets (650 mg total) by mouth every 4 (four) hours as needed for mild pain (temp > 101.5). What changed:  You were already taking a medication with the same name, and this prescription was added. Make sure you understand how and when to take each.   albuterol (2.5 MG/3ML) 0.083% nebulizer solution Commonly known as:  PROVENTIL Take 3 mLs (2.5 mg total) by nebulization every 4 (four) hours as needed for wheezing or shortness of breath.   aluminum-magnesium hydroxide-simethicone 200-200-20 MG/5ML Susp Commonly known as:  MAALOX Take 30 mLs by mouth every 4 (four) hours as needed (heartburn).   ascorbic acid 250 MG tablet Commonly known as:  VITAMIN C Take 1 tablet (250 mg total) by mouth 2 (two) times daily.   aspirin 81 MG EC tablet Take 1 tablet (81 mg total) by mouth daily.   atorvastatin 80 MG tablet Commonly known as:  LIPITOR Take 1 tablet (80 mg total) by mouth daily.   calcium carbonate 500 MG chewable tablet Commonly known as:  TUMS - dosed in mg elemental calcium Chew 2 tablets (400 mg of elemental calcium total) by mouth 2 (two) times daily as needed for indigestion or heartburn.   calcium-vitamin D 500-200 MG-UNIT tablet Commonly known as:  OSCAL WITH D Take 1 tablet by mouth daily.   collagenase ointment Commonly known as:  SANTYL Apply 1 application topically daily.   famotidine 20 MG tablet Commonly known as:  PEPCID Take 1 tablet (20 mg total) by mouth at bedtime.   feeding supplement (GLUCERNA SHAKE) Liqd Take 237 mLs by mouth 3 (three) times daily between meals.   feeding supplement (PRO-STAT SUGAR FREE 64) Liqd Take 30 mLs by mouth 3 (three) times daily with meals.  ferrous sulfate 325 (65 FE) MG tablet Take 1 tablet (325 mg total) by mouth 2 (two) times daily with a meal.   furosemide 20 MG tablet Commonly known as:  LASIX Take 1 tablet (20 mg total) by mouth daily.   gabapentin 300 MG capsule Commonly known as:  NEURONTIN Take  2 capsules (600 mg total) by mouth 2 (two) times daily.   hydrocerin Crea Apply 1 application topically 3 (three) times daily. legs   insulin detemir 100 UNIT/ML injection Commonly known as:  LEVEMIR Inject 0.1 mLs (10 Units total) into the skin daily. Start taking on:  10/15/2017 What changed:  how much to take   lidocaine 4 % cream Commonly known as:  LMX Apply topically daily as needed (during hydro).   loperamide 2 MG capsule Commonly known as:  IMODIUM Take 1 capsule (2 mg total) by mouth every 6 (six) hours as needed for diarrhea or loose stools.   magnesium oxide 400 (241.3 Mg) MG tablet Commonly known as:  MAG-OX Take 1 tablet (400 mg total) by mouth 2 (two) times daily.   metoprolol tartrate 25 MG tablet Commonly known as:  LOPRESSOR Take 1 tablet (25 mg total) by mouth 2 (two) times daily.   multivitamin with minerals Tabs tablet Take 1 tablet by mouth daily.   NOVOLOG FLEXPEN 100 UNIT/ML FlexPen Generic drug:  insulin aspart Inject 2-10 Units into the skin 4 (four) times daily -  with meals and at bedtime. Per sliding scale.. 150-200=2 units 201-250=4 units 251-300=6 units 301-350=8 units 351-400=10 units Over 400 call MD   ondansetron 4 MG tablet Commonly known as:  ZOFRAN Take 4 mg by mouth every 8 (eight) hours as needed for nausea or vomiting.   Oxycodone HCl 20 MG Tabs Take 1 tablet (20 mg total) by mouth every 4 (four) hours as needed (pain).   OXYGEN Inhale 2-3 L into the lungs continuous.   potassium & sodium phosphates 280-160-250 MG Pack Commonly known as:  PHOS-NAK Take 1 packet by mouth 4 (four) times daily -  with meals and at bedtime.   protein supplement shake Liqd Commonly known as:  PREMIER PROTEIN Take 325 mLs (11 oz total) by mouth daily. Start taking on:  10/15/2017   XARELTO 20 MG Tabs tablet Generic drug:  rivaroxaban TAKE 1 TABLET BY MOUTH EVERY DAY WITH SUPPER       Major procedures and Radiology Reports - PLEASE review  detailed and final reports for all details, in brief -    Dg Abd 1 View  Result Date: 09/29/2017 CLINICAL DATA:  Nasogastric tube placement EXAM: ABDOMEN - 1 VIEW COMPARISON:  Portable exam 1558 hours compared to CT abdomen and pelvis 09/15/2015 FINDINGS: Tip of nasogastric tube projects over mid stomach. Gas distention of a bowel loop in mid abdomen up to 11.3 cm diameter, question transverse colon. No definite bowel wall thickening. Exclusion of the pelvis and RIGHT lateral aspect of abdomen. Minimal gaseous distention of stomach. Atelectasis versus consolidation in LEFT lower lobe. Bones demineralized. IMPRESSION: Tip of nasogastric tube projects over mid stomach. Gaseous distention of a bowel loop in the LEFT mid abdomen, probably transverse colon, 11.3 cm diameter. Electronically Signed   By: Ulyses Southward M.D.   On: 09/29/2017 16:17   Ct Abdomen Pelvis W Contrast  Result Date: 10/11/2017 CLINICAL DATA:  Abdominal pain. Currently admitted for septic shock, aspiration pneumonia, and C difficile colitis. EXAM: CT ABDOMEN AND PELVIS WITH CONTRAST TECHNIQUE: Multidetector CT imaging of the abdomen  and pelvis was performed using the standard protocol following bolus administration of intravenous contrast. CONTRAST:  ISOVUE-300 IOPAMIDOL (ISOVUE-300) INJECTION 61% COMPARISON:  CT abdomen pelvis dated September 15, 2015. FINDINGS: Lower chest: Moderate bilateral pleural effusions with consolidation/atelectasis in the left greater than right lower lobes. Hepatobiliary: No focal liver abnormality. Layering gallbladder sludge with mildly thickened or edematous wall. No biliary dilatation. Pancreas: Unremarkable. No pancreatic ductal dilatation or surrounding inflammatory changes. Spleen: Normal in size without focal abnormality. Adrenals/Urinary Tract: Unchanged 3.2 cm adenoma in the left adrenal gland. The right adrenal gland is unremarkable. No focal renal lesion. No renal or ureteral calculi. No hydronephrosis.  The bladder is decompressed by Foley catheter. Stomach/Bowel: Large amount of stool in the distal sigmoid colon and rectum. Rectal tube in place. Mild gaseous distention of the sigmoid colon. No evidence of obstruction, bowel wall thickening, or surrounding inflammatory changes. The stomach is within normal limits. The appendix is surgically absent. Vascular/Lymphatic: Aortic atherosclerosis. Mildly enlarged portacaval lymph node, measuring up to 1.2 cm, previously 1.9 cm. Reproductive: Prostate is unremarkable. Other: Small ascites.  No pneumoperitoneum. Musculoskeletal: Diffuse anasarca. No acute or significant osseous findings. IMPRESSION: 1. Large amount of stool in the distal sigmoid colon and rectum with mild gaseous distention of the sigmoid colon. Correlate for rectal tube function. No definite evidence of gastroenteritis or colitis. 2. Gallbladder sludge with mild wall thickening. Small amount of pericholecystic fluid could be related to ascites. Correlate for acute cholecystitis and consider right upper quadrant ultrasound for further evaluation as clinically indicated. 3. Moderate bilateral pleural effusions with consolidation/atelectasis in the left greater than right lower lobes. 4. Small ascites and diffuse anasarca. 5.  Aortic atherosclerosis (ICD10-I70.0). Electronically Signed   By: Obie Dredge M.D.   On: 10/11/2017 18:52   US Renal  Result Date: 09/29/2017 CLINICAL DATA:  Acute kidney injury superimposed on chronic renal insufficiency. History of diabetes and hypertension. EXAM: RENAL / URINARY TRACT ULTRASOUND COMPLETE COMPARISON:  Renal ultrasound of June 10, 2016 FINDINGS: Right Kidney: Length: 12.4 cm. The renal cortical echotexture remains lower than that of the adjacent liver. There is no hydronephrosis. Left Kidney: Length: 13.4 cm. The renal cortical echotexture is similar to that on the right. There is no hydronephrosis. Bladder: The urinary bladder is decompressed by Foley  catheter. IMPRESSION: Normal appearance of both kidneys.  No hydronephrosis. Electronically Signed   By: David  [place] M.D.   On: 09/29/2017 08:22   Dg Chest Port 1 View  Result Date: 10/02/2017 CLINICAL DATA:  Respiratory failure.  Ex-smoker. EXAM: PORTABLE CHEST 1 VIEW COMPARISON:  10/01/2017. FINDINGS: Endotracheal tube in satisfactory position. Nasogastric tube extending into the stomach. Stable enlarged cardiac silhouette. Mild increase in dense airspace opacity at the left lung base. Increased patchy opacity in the right lower lung zone. Stable prominence of the interstitial markings. Thoracic spine degenerative changes. IMPRESSION: 1. Mild further increase in dense left lower lobe atelectasis or pneumonia. 2. Increased probable pneumonia in the right lower lung zone. 3. Stable cardiomegaly and chronic interstitial lung disease. Electronically Signed   By: Beckie Salts M.D.   On: 10/02/2017 07:16   Dg Chest Port 1 View  Result Date: 10/01/2017 CLINICAL DATA:  Respiratory failure. EXAM: PORTABLE CHEST 1 VIEW COMPARISON:  Yesterday. FINDINGS: Stable enlarged cardiac silhouette. Endotracheal tube is unchanged. Nasogastric tube extending into the stomach. Increased left lower lobe airspace opacity. No significant change in patchy opacity in the right lower lung zone and inferior perihilar region. The interstitial markings remain  prominent. Thoracic spine degenerative changes. IMPRESSION: 1. Increased left lower lobe atelectasis or pneumonia. 2. No significant change in right lower lung zone atelectasis and possible pneumonia. 3. Stable cardiomegaly and chronic interstitial lung disease. Electronically Signed   By: Beckie Salts M.D.   On: 10/01/2017 07:13   Dg Chest Port 1 View  Result Date: 09/30/2017 CLINICAL DATA:  Check endotracheal tube position EXAM: PORTABLE CHEST 1 VIEW COMPARISON:  09/29/2017 FINDINGS: Endotracheal tube and nasogastric catheter are noted in satisfactory position. Cardiac shadow  is mildly prominent but stable. Persistent perihilar changes are noted although slightly improved when compared with the prior exam. No new focal infiltrate is seen. IMPRESSION: Slight improved aeration. Tubes and lines as described. Electronically Signed   By: Alcide Clever M.D.   On: 09/30/2017 06:56   Dg Chest Port 1 View  Result Date: 09/29/2017 CLINICAL DATA:  Intubation. EXAM: PORTABLE CHEST 1 VIEW COMPARISON:  Radiograph 09/11/2017 FINDINGS: Endotracheal tube is 3.4 cm from the carina. Low lung volumes. The heart is enlarged, similar to prior exam. New bilateral perihilar opacities. No large pleural effusion or pneumothorax. IMPRESSION: 1. Endotracheal tube 3.4 cm from the carina. 2. Cardiomegaly. Bilateral perihilar opacities may be pulmonary edema, aspiration or pneumonia. Electronically Signed   By: Rubye Oaks M.D.   On: 09/29/2017 04:41   US Abdomen Limited Ruq  Result Date: 10/12/2017 CLINICAL DATA:  67 year old male with abnormal gallbladder on recent CT. Recent abdominal pain. EXAM: ULTRASOUND ABDOMEN LIMITED RIGHT UPPER QUADRANT COMPARISON:  CT Abdomen and Pelvis 14 19 end earlier. FINDINGS: Gallbladder: Gallbladder wall thickness is at the upper limits of normal, 3 millimeters. Possible sludge, but no echogenic stones are identified within the gallbladder lumen. No pericholecystic fluid identified. No sonographic Murphy sign elicited. Common bile duct: Diameter: 4 millimeters, normal Liver: Generalized increased liver echogenicity (image 42). No discrete liver lesion. No intrahepatic biliary ductal dilatation. Portal vein is patent on color Doppler imaging with normal direction of blood flow towards the liver. Other findings: Trace perihepatic free fluid (image 19). Partially visible right pleural effusion (image 20). IMPRESSION: 1. No cholelithiasis or evidence of acute cholecystitis. 2. No evidence of acute biliary obstruction. 3. Fatty liver disease with trace perihepatic free fluid.  Partially visible right pleural effusion. Electronically Signed   By: Odessa Fleming M.D.   On: 10/12/2017 15:20    Micro Results   No results found for this or any previous visit (from the past 240 hour(s)).     Today   Subjective    Velora Mediate today has no new complaints, eating and drinking well, no frank diarrhea, no nausea no vomiting, no abdominal pain no fevers no chest pains and palpitations, no  shortness of breath   Patient has been seen and examined prior to discharge   Objective   Blood pressure (!) 121/50, pulse 87, temperature 98.7 F (37.1 C), temperature source Axillary, resp. rate 20, height 6' (1.829 m), weight 121.6 kg (268 lb 1.3 oz), SpO2 99 %.   Intake/Output Summary (Last 24 hours) at 10/14/2017 1627 Last data filed at 10/14/2017 1300 Gross per 24 hour  Intake 480 ml  Output 4700 ml  Net -4220 ml    Exam Gen:- Awake Alert,  In no apparent distress , obese HEENT:- [place].AT, No sclera icterus Neck-Supple Neck,No JVD,.  Lungs-diminished in bases, no wheezing CV- S1, S2 normal Abd-  +ve B.Sounds, Abd Soft, No tenderness, increased truncal adiposity noted Extremity/Skin:-Right AKA, extensive sacral/buttock wounds with non viable tissue/areas psych-affect  is appropriate, oriented x3 Neuro-no new focal deficits, no tremors GU-scrotal and penile swelling, Foley with clear urine   Data Review   CBC w Diff:  Lab Results  Component Value Date   WBC 10.7 (H) 10/14/2017   HGB 8.1 (L) 10/14/2017   HCT 26.3 (L) 10/14/2017   PLT 274 10/14/2017   LYMPHOPCT 14 10/12/2017   BANDSPCT 0 09/07/2017   MONOPCT 9 10/12/2017   EOSPCT 2 10/12/2017   BASOPCT 0 10/12/2017    CMP:  Lab Results  Component Value Date   NA 134 (L) 10/14/2017   NA 136 (A) 03/22/2017   K 4.7 10/14/2017   CL 99 (L) 10/14/2017   CO2 27 10/14/2017   BUN 9 10/14/2017   BUN 34 (A) 03/22/2017   CREATININE 0.87 10/14/2017   CREATININE 1.57 (H) 08/06/2016   GLU 327 03/22/2017   PROT 5.1 (L)  10/01/2017   ALBUMIN 1.4 (L) 10/01/2017   BILITOT 0.7 10/01/2017   ALKPHOS 198 (H) 10/01/2017   AST 21 10/01/2017   ALT 13 (L) 10/01/2017  .   Total Discharge time is about 33 minutes  Shon Hale M.D on 10/14/2017 at 4:27 PM  [name]   Office  [phone]  Voice Recognition Reubin [employer] dictation system was used to create this note, attempts have been made to correct errors. Please contact the author with questions and/or clarifications.

## 2017-10-18 ENCOUNTER — Other Ambulatory Visit: Payer: Self-pay

## 2017-10-19 DIAGNOSIS — R5383 Other fatigue: Secondary | ICD-10-CM | POA: Diagnosis not present

## 2017-10-19 DIAGNOSIS — L89154 Pressure ulcer of sacral region, stage 4: Secondary | ICD-10-CM | POA: Diagnosis not present

## 2017-10-19 DIAGNOSIS — E1165 Type 2 diabetes mellitus with hyperglycemia: Secondary | ICD-10-CM | POA: Diagnosis not present

## 2017-10-19 NOTE — Patient Outreach (Signed)
[facility]) Care Management  10/19/2017  [name] 1951-07-10 [id number]   Medication Adherence call to [name]  Patient is showing past due under [place] health care ins.on Atorvastin 80 mgpatient did not answer  left a message for patient to call back.  Lillia Abed CPhT Pharmacy Technician [facility] Care Management Direct Dial [phone]  Fax [phone] [email or web address]

## 2017-10-20 ENCOUNTER — Encounter (HOSPITAL_COMMUNITY): Payer: Self-pay

## 2017-10-20 ENCOUNTER — Emergency Department (HOSPITAL_COMMUNITY): Payer: Medicare Other

## 2017-10-20 ENCOUNTER — Other Ambulatory Visit: Payer: Self-pay

## 2017-10-20 ENCOUNTER — Inpatient Hospital Stay (HOSPITAL_COMMUNITY)
Admission: EM | Admit: 2017-10-20 | Discharge: 2017-11-01 | DRG: 673 | Disposition: A | Discharge status: Skilled Nursing Facility | Payer: Medicare Other | Attending: Family Medicine | Admitting: Family Medicine

## 2017-10-20 DIAGNOSIS — M19041 Primary osteoarthritis, right hand: Secondary | ICD-10-CM | POA: Diagnosis present

## 2017-10-20 DIAGNOSIS — L0390 Cellulitis, unspecified: Secondary | ICD-10-CM | POA: Diagnosis not present

## 2017-10-20 DIAGNOSIS — K5730 Diverticulosis of large intestine without perforation or abscess without bleeding: Secondary | ICD-10-CM | POA: Diagnosis not present

## 2017-10-20 DIAGNOSIS — R195 Other fecal abnormalities: Secondary | ICD-10-CM | POA: Diagnosis present

## 2017-10-20 DIAGNOSIS — R6520 Severe sepsis without septic shock: Secondary | ICD-10-CM | POA: Diagnosis present

## 2017-10-20 DIAGNOSIS — I130 Hypertensive heart and chronic kidney disease with heart failure and stage 1 through stage 4 chronic kidney disease, or unspecified chronic kidney disease: Secondary | ICD-10-CM | POA: Diagnosis not present

## 2017-10-20 DIAGNOSIS — E1140 Type 2 diabetes mellitus with diabetic neuropathy, unspecified: Secondary | ICD-10-CM | POA: Diagnosis present

## 2017-10-20 DIAGNOSIS — Z8619 Personal history of other infectious and parasitic diseases: Secondary | ICD-10-CM

## 2017-10-20 DIAGNOSIS — K625 Hemorrhage of anus and rectum: Secondary | ICD-10-CM | POA: Diagnosis not present

## 2017-10-20 DIAGNOSIS — I4891 Unspecified atrial fibrillation: Secondary | ICD-10-CM

## 2017-10-20 DIAGNOSIS — E1122 Type 2 diabetes mellitus with diabetic chronic kidney disease: Secondary | ICD-10-CM | POA: Diagnosis present

## 2017-10-20 DIAGNOSIS — L8990 Pressure ulcer of unspecified site, unspecified stage: Secondary | ICD-10-CM | POA: Diagnosis present

## 2017-10-20 DIAGNOSIS — G9340 Encephalopathy, unspecified: Secondary | ICD-10-CM | POA: Diagnosis present

## 2017-10-20 DIAGNOSIS — L89159 Pressure ulcer of sacral region, unspecified stage: Secondary | ICD-10-CM

## 2017-10-20 DIAGNOSIS — Z1624 Resistance to multiple antibiotics: Secondary | ICD-10-CM | POA: Diagnosis present

## 2017-10-20 DIAGNOSIS — N390 Urinary tract infection, site not specified: Secondary | ICD-10-CM | POA: Diagnosis present

## 2017-10-20 DIAGNOSIS — I2510 Atherosclerotic heart disease of native coronary artery without angina pectoris: Secondary | ICD-10-CM | POA: Diagnosis not present

## 2017-10-20 DIAGNOSIS — E785 Hyperlipidemia, unspecified: Secondary | ICD-10-CM | POA: Diagnosis not present

## 2017-10-20 DIAGNOSIS — Z79899 Other long term (current) drug therapy: Secondary | ICD-10-CM

## 2017-10-20 DIAGNOSIS — A4189 Other specified sepsis: Secondary | ICD-10-CM | POA: Diagnosis present

## 2017-10-20 DIAGNOSIS — M545 Low back pain: Secondary | ICD-10-CM | POA: Diagnosis not present

## 2017-10-20 DIAGNOSIS — N183 Chronic kidney disease, stage 3 (moderate): Secondary | ICD-10-CM | POA: Diagnosis present

## 2017-10-20 DIAGNOSIS — Z7901 Long term (current) use of anticoagulants: Secondary | ICD-10-CM | POA: Diagnosis not present

## 2017-10-20 DIAGNOSIS — T83518A Infection and inflammatory reaction due to other urinary catheter, initial encounter: Secondary | ICD-10-CM | POA: Diagnosis not present

## 2017-10-20 DIAGNOSIS — I5032 Chronic diastolic (congestive) heart failure: Secondary | ICD-10-CM | POA: Diagnosis present

## 2017-10-20 DIAGNOSIS — L89153 Pressure ulcer of sacral region, stage 3: Secondary | ICD-10-CM | POA: Diagnosis not present

## 2017-10-20 DIAGNOSIS — Z515 Encounter for palliative care: Secondary | ICD-10-CM | POA: Diagnosis not present

## 2017-10-20 DIAGNOSIS — Z794 Long term (current) use of insulin: Secondary | ICD-10-CM

## 2017-10-20 DIAGNOSIS — Z7189 Other specified counseling: Secondary | ICD-10-CM | POA: Diagnosis not present

## 2017-10-20 DIAGNOSIS — M479 Spondylosis, unspecified: Secondary | ICD-10-CM | POA: Diagnosis present

## 2017-10-20 DIAGNOSIS — R8271 Bacteriuria: Secondary | ICD-10-CM | POA: Diagnosis not present

## 2017-10-20 DIAGNOSIS — E1165 Type 2 diabetes mellitus with hyperglycemia: Secondary | ICD-10-CM | POA: Diagnosis not present

## 2017-10-20 DIAGNOSIS — Z6841 Body Mass Index (BMI) 40.0 and over, adult: Secondary | ICD-10-CM | POA: Diagnosis not present

## 2017-10-20 DIAGNOSIS — I480 Paroxysmal atrial fibrillation: Secondary | ICD-10-CM | POA: Diagnosis not present

## 2017-10-20 DIAGNOSIS — Z9049 Acquired absence of other specified parts of digestive tract: Secondary | ICD-10-CM

## 2017-10-20 DIAGNOSIS — Z960 Presence of urogenital implants: Secondary | ICD-10-CM | POA: Diagnosis not present

## 2017-10-20 DIAGNOSIS — K922 Gastrointestinal hemorrhage, unspecified: Secondary | ICD-10-CM | POA: Diagnosis present

## 2017-10-20 DIAGNOSIS — R6521 Severe sepsis with septic shock: Secondary | ICD-10-CM | POA: Diagnosis not present

## 2017-10-20 DIAGNOSIS — G4733 Obstructive sleep apnea (adult) (pediatric): Secondary | ICD-10-CM | POA: Diagnosis not present

## 2017-10-20 DIAGNOSIS — D509 Iron deficiency anemia, unspecified: Secondary | ICD-10-CM | POA: Diagnosis not present

## 2017-10-20 DIAGNOSIS — R402413 Glasgow coma scale score 13-15, at hospital admission: Secondary | ICD-10-CM | POA: Diagnosis present

## 2017-10-20 DIAGNOSIS — E113299 Type 2 diabetes mellitus with mild nonproliferative diabetic retinopathy without macular edema, unspecified eye: Secondary | ICD-10-CM | POA: Diagnosis not present

## 2017-10-20 DIAGNOSIS — Z66 Do not resuscitate: Secondary | ICD-10-CM | POA: Diagnosis present

## 2017-10-20 DIAGNOSIS — Z89611 Acquired absence of right leg above knee: Secondary | ICD-10-CM | POA: Diagnosis not present

## 2017-10-20 DIAGNOSIS — A419 Sepsis, unspecified organism: Secondary | ICD-10-CM

## 2017-10-20 DIAGNOSIS — Y732 Prosthetic and other implants, materials and accessory gastroenterology and urology devices associated with adverse incidents: Secondary | ICD-10-CM | POA: Diagnosis present

## 2017-10-20 DIAGNOSIS — I96 Gangrene, not elsewhere classified: Secondary | ICD-10-CM | POA: Diagnosis not present

## 2017-10-20 DIAGNOSIS — N492 Inflammatory disorders of scrotum: Secondary | ICD-10-CM | POA: Diagnosis present

## 2017-10-20 DIAGNOSIS — J9621 Acute and chronic respiratory failure with hypoxia: Secondary | ICD-10-CM | POA: Diagnosis not present

## 2017-10-20 DIAGNOSIS — G9341 Metabolic encephalopathy: Secondary | ICD-10-CM | POA: Diagnosis not present

## 2017-10-20 DIAGNOSIS — Z9981 Dependence on supplemental oxygen: Secondary | ICD-10-CM | POA: Diagnosis not present

## 2017-10-20 DIAGNOSIS — M19042 Primary osteoarthritis, left hand: Secondary | ICD-10-CM | POA: Diagnosis present

## 2017-10-20 DIAGNOSIS — L8994 Pressure ulcer of unspecified site, stage 4: Secondary | ICD-10-CM | POA: Diagnosis present

## 2017-10-20 DIAGNOSIS — L89304 Pressure ulcer of unspecified buttock, stage 4: Secondary | ICD-10-CM | POA: Diagnosis not present

## 2017-10-20 DIAGNOSIS — L89150 Pressure ulcer of sacral region, unstageable: Secondary | ICD-10-CM | POA: Diagnosis not present

## 2017-10-20 DIAGNOSIS — Y846 Urinary catheterization as the cause of abnormal reaction of the patient, or of later complication, without mention of misadventure at the time of the procedure: Secondary | ICD-10-CM | POA: Diagnosis present

## 2017-10-20 DIAGNOSIS — G629 Polyneuropathy, unspecified: Secondary | ICD-10-CM | POA: Diagnosis not present

## 2017-10-20 DIAGNOSIS — L089 Local infection of the skin and subcutaneous tissue, unspecified: Secondary | ICD-10-CM

## 2017-10-20 DIAGNOSIS — E43 Unspecified severe protein-calorie malnutrition: Secondary | ICD-10-CM | POA: Diagnosis present

## 2017-10-20 DIAGNOSIS — I493 Ventricular premature depolarization: Secondary | ICD-10-CM | POA: Diagnosis present

## 2017-10-20 DIAGNOSIS — B9689 Other specified bacterial agents as the cause of diseases classified elsewhere: Secondary | ICD-10-CM | POA: Diagnosis not present

## 2017-10-20 DIAGNOSIS — S31000D Unspecified open wound of lower back and pelvis without penetration into retroperitoneum, subsequent encounter: Secondary | ICD-10-CM | POA: Diagnosis not present

## 2017-10-20 DIAGNOSIS — E876 Hypokalemia: Secondary | ICD-10-CM | POA: Diagnosis present

## 2017-10-20 DIAGNOSIS — D631 Anemia in chronic kidney disease: Secondary | ICD-10-CM | POA: Diagnosis present

## 2017-10-20 DIAGNOSIS — E1152 Type 2 diabetes mellitus with diabetic peripheral angiopathy with gangrene: Secondary | ICD-10-CM | POA: Diagnosis present

## 2017-10-20 DIAGNOSIS — M6281 Muscle weakness (generalized): Secondary | ICD-10-CM | POA: Diagnosis not present

## 2017-10-20 DIAGNOSIS — G8929 Other chronic pain: Secondary | ICD-10-CM | POA: Diagnosis not present

## 2017-10-20 DIAGNOSIS — N5089 Other specified disorders of the male genital organs: Secondary | ICD-10-CM | POA: Diagnosis present

## 2017-10-20 DIAGNOSIS — Z9861 Coronary angioplasty status: Secondary | ICD-10-CM | POA: Diagnosis not present

## 2017-10-20 DIAGNOSIS — Z87891 Personal history of nicotine dependence: Secondary | ICD-10-CM

## 2017-10-20 DIAGNOSIS — Z7401 Bed confinement status: Secondary | ICD-10-CM

## 2017-10-20 DIAGNOSIS — R109 Unspecified abdominal pain: Secondary | ICD-10-CM | POA: Diagnosis not present

## 2017-10-20 DIAGNOSIS — Z955 Presence of coronary angioplasty implant and graft: Secondary | ICD-10-CM

## 2017-10-20 DIAGNOSIS — E861 Hypovolemia: Secondary | ICD-10-CM | POA: Diagnosis present

## 2017-10-20 DIAGNOSIS — G5603 Carpal tunnel syndrome, bilateral upper limbs: Secondary | ICD-10-CM | POA: Diagnosis present

## 2017-10-20 DIAGNOSIS — L89143 Pressure ulcer of left lower back, stage 3: Secondary | ICD-10-CM | POA: Diagnosis not present

## 2017-10-20 DIAGNOSIS — L89154 Pressure ulcer of sacral region, stage 4: Secondary | ICD-10-CM | POA: Diagnosis not present

## 2017-10-20 DIAGNOSIS — N433 Hydrocele, unspecified: Secondary | ICD-10-CM | POA: Diagnosis present

## 2017-10-20 DIAGNOSIS — R102 Pelvic and perineal pain: Secondary | ICD-10-CM | POA: Diagnosis not present

## 2017-10-20 LAB — COMPREHENSIVE METABOLIC PANEL
ALT: 9 U/L — ABNORMAL LOW (ref 17–63)
AST: 22 U/L (ref 15–41)
Albumin: 1.7 g/dL — ABNORMAL LOW (ref 3.5–5.0)
Alkaline Phosphatase: 132 U/L — ABNORMAL HIGH (ref 38–126)
Anion gap: 13 (ref 5–15)
BUN: 13 mg/dL (ref 6–20)
CHLORIDE: 94 mmol/L — AB (ref 101–111)
CO2: 28 mmol/L (ref 22–32)
Calcium: 7.9 mg/dL — ABNORMAL LOW (ref 8.9–10.3)
Creatinine, Ser: 1.17 mg/dL (ref 0.61–1.24)
Glucose, Bld: 191 mg/dL — ABNORMAL HIGH (ref 65–99)
POTASSIUM: 3.9 mmol/L (ref 3.5–5.1)
Sodium: 135 mmol/L (ref 135–145)
Total Bilirubin: 0.6 mg/dL (ref 0.3–1.2)
Total Protein: 6.3 g/dL — ABNORMAL LOW (ref 6.5–8.1)

## 2017-10-20 LAB — PROTIME-INR
INR: 2.54
Prothrombin Time: 27.2 seconds — ABNORMAL HIGH (ref 11.4–15.2)

## 2017-10-20 LAB — CBC WITH DIFFERENTIAL/PLATELET
BASOS ABS: 0 10*3/uL (ref 0.0–0.1)
BASOS PCT: 0 %
Eosinophils Absolute: 0.1 10*3/uL (ref 0.0–0.7)
Eosinophils Relative: 1 %
HEMATOCRIT: 28.3 % — AB (ref 39.0–52.0)
HEMOGLOBIN: 8.5 g/dL — AB (ref 13.0–17.0)
Lymphocytes Relative: 11 %
Lymphs Abs: 1.2 10*3/uL (ref 0.7–4.0)
MCH: 23.5 pg — ABNORMAL LOW (ref 26.0–34.0)
MCHC: 30 g/dL (ref 30.0–36.0)
MCV: 78.2 fL (ref 78.0–100.0)
Monocytes Absolute: 0.9 10*3/uL (ref 0.1–1.0)
Monocytes Relative: 7 %
NEUTROS ABS: 9.5 10*3/uL — AB (ref 1.7–7.7)
NEUTROS PCT: 81 %
Platelets: 317 10*3/uL (ref 150–400)
RBC: 3.62 MIL/uL — ABNORMAL LOW (ref 4.22–5.81)
RDW: 20.3 % — ABNORMAL HIGH (ref 11.5–15.5)
WBC: 11.7 10*3/uL — AB (ref 4.0–10.5)

## 2017-10-20 LAB — URINALYSIS, ROUTINE W REFLEX MICROSCOPIC
BILIRUBIN URINE: NEGATIVE
Glucose, UA: NEGATIVE mg/dL
Ketones, ur: 5 mg/dL — AB
Nitrite: NEGATIVE
PH: 7 (ref 5.0–8.0)
Protein, ur: 30 mg/dL — AB
SPECIFIC GRAVITY, URINE: 1.012 (ref 1.005–1.030)
Squamous Epithelial / LPF: NONE SEEN

## 2017-10-20 LAB — CBG MONITORING, ED: Glucose-Capillary: 197 mg/dL — ABNORMAL HIGH (ref 65–99)

## 2017-10-20 LAB — TYPE AND SCREEN
ABO/RH(D): A NEG
Antibody Screen: NEGATIVE

## 2017-10-20 LAB — PROCALCITONIN: PROCALCITONIN: 0.15 ng/mL

## 2017-10-20 LAB — LACTIC ACID, PLASMA: Lactic Acid, Venous: 2.2 mmol/L (ref 0.5–1.9)

## 2017-10-20 LAB — POC OCCULT BLOOD, ED: Fecal Occult Bld: POSITIVE — AB

## 2017-10-20 LAB — I-STAT CG4 LACTIC ACID, ED: LACTIC ACID, VENOUS: 4.1 mmol/L — AB (ref 0.5–1.9)

## 2017-10-20 MED ORDER — INSULIN DETEMIR 100 UNIT/ML ~~LOC~~ SOLN
5.0000 [IU] | Freq: Two times a day (BID) | SUBCUTANEOUS | Status: DC
  Administered 2017-10-20 – 2017-10-24 (×8): 5 [IU] via SUBCUTANEOUS
  Filled 2017-10-20 (×9): qty 0.05

## 2017-10-20 MED ORDER — SODIUM CHLORIDE 0.9 % IV BOLUS (SEPSIS)
1000.0000 mL | Freq: Once | INTRAVENOUS | Status: AC
  Administered 2017-10-20: 1000 mL via INTRAVENOUS

## 2017-10-20 MED ORDER — DILTIAZEM HCL-DEXTROSE 100-5 MG/100ML-% IV SOLN (PREMIX)
5.0000 mg/h | INTRAVENOUS | Status: DC
  Administered 2017-10-20: 5 mg/h via INTRAVENOUS
  Filled 2017-10-20: qty 100

## 2017-10-20 MED ORDER — MORPHINE SULFATE (PF) 4 MG/ML IV SOLN
4.0000 mg | Freq: Once | INTRAVENOUS | Status: AC
  Administered 2017-10-20: 4 mg via INTRAVENOUS
  Filled 2017-10-20: qty 1

## 2017-10-20 MED ORDER — ATORVASTATIN CALCIUM 40 MG PO TABS
80.0000 mg | ORAL_TABLET | Freq: Every day | ORAL | Status: DC
  Administered 2017-10-21 – 2017-11-01 (×11): 80 mg via ORAL
  Filled 2017-10-20 (×12): qty 2

## 2017-10-20 MED ORDER — VANCOMYCIN HCL IN DEXTROSE 1-5 GM/200ML-% IV SOLN
1000.0000 mg | Freq: Once | INTRAVENOUS | Status: AC
  Administered 2017-10-20: 1000 mg via INTRAVENOUS
  Filled 2017-10-20: qty 200

## 2017-10-20 MED ORDER — GLUCERNA SHAKE PO LIQD
237.0000 mL | Freq: Three times a day (TID) | ORAL | Status: DC
  Administered 2017-10-22 – 2017-10-26 (×3): 237 mL via ORAL
  Filled 2017-10-20 (×17): qty 237

## 2017-10-20 MED ORDER — PRO-STAT SUGAR FREE PO LIQD
30.0000 mL | Freq: Three times a day (TID) | ORAL | Status: DC
  Administered 2017-10-21 – 2017-10-26 (×5): 30 mL via ORAL
  Filled 2017-10-20 (×6): qty 30

## 2017-10-20 MED ORDER — ACETAMINOPHEN 325 MG PO TABS
650.0000 mg | ORAL_TABLET | Freq: Four times a day (QID) | ORAL | Status: DC | PRN
  Administered 2017-11-01: 650 mg via ORAL
  Filled 2017-10-20: qty 2

## 2017-10-20 MED ORDER — HYDROCORTISONE NA SUCCINATE PF 100 MG IJ SOLR
100.0000 mg | INTRAMUSCULAR | Status: AC
  Administered 2017-10-20: 100 mg via INTRAVENOUS
  Filled 2017-10-20: qty 2

## 2017-10-20 MED ORDER — CALCIUM CARBONATE-VITAMIN D 500-200 MG-UNIT PO TABS
1.0000 | ORAL_TABLET | Freq: Every day | ORAL | Status: DC
  Administered 2017-10-21 – 2017-11-01 (×11): 1 via ORAL
  Filled 2017-10-20 (×11): qty 1

## 2017-10-20 MED ORDER — ALBUMIN HUMAN 25 % IV SOLN
25.0000 g | Freq: Once | INTRAVENOUS | Status: AC
  Administered 2017-10-20: 25 g via INTRAVENOUS
  Filled 2017-10-20: qty 100

## 2017-10-20 MED ORDER — ADULT MULTIVITAMIN W/MINERALS CH
1.0000 | ORAL_TABLET | Freq: Every day | ORAL | Status: DC
  Administered 2017-10-21 – 2017-11-01 (×11): 1 via ORAL
  Filled 2017-10-20 (×11): qty 1

## 2017-10-20 MED ORDER — INSULIN ASPART 100 UNIT/ML ~~LOC~~ SOLN
0.0000 [IU] | SUBCUTANEOUS | Status: DC
  Administered 2017-10-20: 2 [IU] via SUBCUTANEOUS
  Administered 2017-10-21 (×3): 3 [IU] via SUBCUTANEOUS
  Administered 2017-10-21 (×2): 5 [IU] via SUBCUTANEOUS
  Administered 2017-10-21: 3 [IU] via SUBCUTANEOUS
  Administered 2017-10-22 (×3): 5 [IU] via SUBCUTANEOUS
  Filled 2017-10-20: qty 1

## 2017-10-20 MED ORDER — SODIUM CHLORIDE 0.9 % IV BOLUS
500.0000 mL | Freq: Once | INTRAVENOUS | Status: AC
  Administered 2017-10-20: 500 mL via INTRAVENOUS

## 2017-10-20 MED ORDER — METOPROLOL TARTRATE 5 MG/5ML IV SOLN
2.5000 mg | Freq: Once | INTRAVENOUS | Status: AC
  Administered 2017-10-20: 2.5 mg via INTRAVENOUS
  Filled 2017-10-20: qty 5

## 2017-10-20 MED ORDER — VANCOMYCIN HCL IN DEXTROSE 1-5 GM/200ML-% IV SOLN
1000.0000 mg | Freq: Two times a day (BID) | INTRAVENOUS | Status: DC
  Administered 2017-10-20 – 2017-10-21 (×3): 1000 mg via INTRAVENOUS
  Filled 2017-10-20 (×4): qty 200

## 2017-10-20 MED ORDER — PANTOPRAZOLE SODIUM 40 MG IV SOLR
40.0000 mg | Freq: Every day | INTRAVENOUS | Status: DC
  Administered 2017-10-20: 40 mg via INTRAVENOUS
  Filled 2017-10-20: qty 40

## 2017-10-20 MED ORDER — PIPERACILLIN-TAZOBACTAM 3.375 G IVPB 30 MIN
3.3750 g | Freq: Once | INTRAVENOUS | Status: AC
  Administered 2017-10-20: 3.375 g via INTRAVENOUS
  Filled 2017-10-20: qty 50

## 2017-10-20 MED ORDER — GABAPENTIN 300 MG PO CAPS
600.0000 mg | ORAL_CAPSULE | Freq: Two times a day (BID) | ORAL | Status: DC
  Administered 2017-10-21 – 2017-11-01 (×22): 600 mg via ORAL
  Filled 2017-10-20 (×22): qty 2

## 2017-10-20 MED ORDER — ONDANSETRON HCL 4 MG/2ML IJ SOLN: 4.0000 mg | Freq: Four times a day (QID) | INTRAMUSCULAR | Status: DC | PRN

## 2017-10-20 MED ORDER — SODIUM CHLORIDE 0.9 % IV SOLN
INTRAVENOUS | Status: AC
  Administered 2017-10-20: 20:00:00 via INTRAVENOUS

## 2017-10-20 MED ORDER — SODIUM CHLORIDE 0.9 % IV SOLN
1.0000 g | Freq: Three times a day (TID) | INTRAVENOUS | Status: DC
  Administered 2017-10-20 – 2017-10-24 (×11): 1 g via INTRAVENOUS
  Filled 2017-10-20 (×12): qty 1

## 2017-10-20 MED ORDER — METOPROLOL TARTRATE 25 MG PO TABS
25.0000 mg | ORAL_TABLET | Freq: Two times a day (BID) | ORAL | Status: DC
  Administered 2017-10-21: 25 mg via ORAL
  Filled 2017-10-20: qty 1

## 2017-10-20 MED ORDER — DIPHENHYDRAMINE HCL 25 MG PO CAPS: 25.0000 mg | ORAL_CAPSULE | ORAL | Status: DC | PRN

## 2017-10-20 MED ORDER — SODIUM CHLORIDE 0.9 % IV BOLUS (SEPSIS)
400.0000 mL | Freq: Once | INTRAVENOUS | Status: AC
  Administered 2017-10-20: 400 mL via INTRAVENOUS

## 2017-10-20 MED ORDER — POTASSIUM & SODIUM PHOSPHATES 280-160-250 MG PO PACK
1.0000 | PACK | Freq: Three times a day (TID) | ORAL | Status: DC
  Administered 2017-10-21 (×2): 1 via ORAL
  Filled 2017-10-20 (×2): qty 1

## 2017-10-20 MED ORDER — WHITE PETROLATUM EX OINT
TOPICAL_OINTMENT | CUTANEOUS | Status: DC | PRN
  Administered 2017-10-20: 22:00:00 via TOPICAL
  Filled 2017-10-20 (×2): qty 5

## 2017-10-20 MED ORDER — HYDROCORTISONE NA SUCCINATE PF 100 MG IJ SOLR
50.0000 mg | Freq: Four times a day (QID) | INTRAMUSCULAR | Status: DC
  Administered 2017-10-21 – 2017-10-22 (×6): 50 mg via INTRAVENOUS
  Filled 2017-10-20 (×6): qty 2

## 2017-10-20 MED ORDER — ALBUTEROL SULFATE (2.5 MG/3ML) 0.083% IN NEBU
2.5000 mg | INHALATION_SOLUTION | RESPIRATORY_TRACT | Status: DC | PRN
Start: 2017-10-20 — End: 2017-11-02
  Administered 2017-10-22: 2.5 mg via RESPIRATORY_TRACT
  Filled 2017-10-20: qty 3

## 2017-10-20 MED ORDER — ONDANSETRON HCL 4 MG PO TABS: 4.0000 mg | ORAL_TABLET | Freq: Four times a day (QID) | ORAL | Status: DC | PRN

## 2017-10-20 MED ORDER — MAGNESIUM OXIDE 400 (241.3 MG) MG PO TABS: 400.0000 mg | ORAL_TABLET | Freq: Two times a day (BID) | ORAL | Status: DC

## 2017-10-20 MED ORDER — VANCOMYCIN HCL IN DEXTROSE 1-5 GM/200ML-% IV SOLN: 1000.0000 mg | Freq: Once | INTRAVENOUS | Status: DC

## 2017-10-20 MED ORDER — HYDROMORPHONE HCL 1 MG/ML IJ SOLN
0.5000 mg | INTRAMUSCULAR | Status: DC | PRN
  Administered 2017-10-20 – 2017-10-21 (×2): 1 mg via INTRAVENOUS
  Filled 2017-10-20 (×2): qty 1

## 2017-10-20 MED ORDER — ACETAMINOPHEN 650 MG RE SUPP
650.0000 mg | Freq: Four times a day (QID) | RECTAL | Status: DC | PRN
  Administered 2017-10-20: 650 mg via RECTAL
  Filled 2017-10-20: qty 1

## 2017-10-20 MED ORDER — SODIUM CHLORIDE 0.9% FLUSH
3.0000 mL | Freq: Two times a day (BID) | INTRAVENOUS | Status: DC
  Administered 2017-10-20 – 2017-11-01 (×12): 3 mL via INTRAVENOUS

## 2017-10-20 NOTE — ED Notes (Signed)
Bed: OT15 Expected date:  Expected time:  Means of arrival:  Comments: EMS rectal bleeding/sepsis

## 2017-10-20 NOTE — Progress Notes (Signed)
A consult was received from an ED physician for vanc/zosyn per pharmacy dosing.  The patient's profile has been reviewed for ht/wt/allergies/indication/available labs.   A one time order has been placed for vanc 1g and Zosyn 3.375g.  Further antibiotics/pharmacy consults should be ordered by admitting physician if indicated.                       Thank you, Berkley Harvey 10/20/2017  4:39 PM

## 2017-10-20 NOTE — Progress Notes (Signed)
Pharmacy Antibiotic Note  [name] is a 67 y.o. male admitted on 10/20/2017 with sepsis.  Pharmacy has been consulted for vancomycin and meropenem dosing.  Patient with suspected skin/soft tissue infection. At end of March, had + c. Difficile,  UCx = VRE and acinetobacter (treated with amp/sulb).  Clostridium spp found in BCx last admission Noted to have ESBL E. coli bacteremia in Feb.   Today, 10/20/2017  Renal: Scr sl elevated  + fevers  Recent infections with MDRO  Plan:  Vancomycin 1gm IV q12h  Meropenem 1gm IV q8h  Monitor renal fx  Await culture results  Consider ID consultation if does not improve within next 24h based on h/o MDRO  Height: 6' (182.9 cm) Weight: 268 lb (121.6 kg) IBW/kg (Calculated) : 77.6  Temp (24hrs), Avg:100.4 F (38 C), Min:100.4 F (38 C), Max:100.4 F (38 C)  Recent Labs  Lab 10/14/17 0508 10/14/17 1134 10/20/17 1630  WBC  --  10.7* 11.7*  CREATININE 0.81 0.87 1.17  LATICACIDVEN  --   --  4.10*    Estimated Creatinine Clearance: 84.8 mL/min (by C-G formula based on SCr of 1.17 mg/dL).    No Known Allergies  Antimicrobials this admission: 4/11 vancomycin >> 4/11 meropenem >>  Dose adjustments this admission:  Microbiology results: 3/21 UCx: 70K VRE (amp susc), acinetobacter (S to amp/sulb, cefepime, gent) 3/21 BCx: 1/2 GVR = clostridium spp.  3/22 C. Diff quick scan: +/-  3/22 C. Diff PCR: Positive  4/11 BCx:  4/11 UCx:     Thank you for allowing pharmacy to be a part of this patient's care.  Juliette Alcide, PharmD, BCPS.   Pager: [phone] 10/20/2017 8:40 PM

## 2017-10-20 NOTE — ED Notes (Signed)
hospitalist Opyd paged concerning patient's pressures

## 2017-10-20 NOTE — ED Provider Notes (Signed)
[facility]-EMERGENCY DEPT Provider Note   CSN: [id number] Arrival date & time: 10/20/17  1526    History   Chief Complaint Chief Complaint  Patient presents with  . GI Bleeding  . possible sepsis  . Altered Mental Status    HPI [name] is a 67 y.o. male.  HPI Patient presented to the emergency room for evaluation of possible sepsis, GI bleed, altered mental status.  Patient has a complicated medical history.  He is currently residing in a nursing facility.  Staff at the facility noted that today the patient is more listless and less alert.  Normally he is alert and oriented and conversant.  Patient unable to provide any clear history.  I asked him what brought him into the emergency room he just answers with a of God.    Past Medical History:  Diagnosis Date  . Arthritis    "hands and lower back" (09/19/2014)  . CAD (coronary artery disease)    a. s/p PCI to RCA in 2012. b. prior cath in 01/2014 with elevated L/RH pressures, mild-mod CAD of LAD/LCx with patent RCA, normal EF, c/b CIN/CHF.  [name] Carpal tunnel syndrome, bilateral   . Cellulitis   . Chronic diastolic CHF (congestive heart failure) (HCC)   . Chronic kidney disease (CKD), stage III (moderate) (HCC)   . Chronic lower back pain   . Diabetes (HCC)   . DKA (diabetic ketoacidoses) (HCC) 03/2017  . History of blood transfusion ~ 1954   "related to OR"  . Hyperlipidemia   . Hypertension   . Hypoxia    a. Qualified for home O2 at DC in 09/2014.  [name] Lower GI bleed   . Microcytic anemia   . Morbid obesity (HCC)   . Neuropathy   . OSA (obstructive sleep apnea)    "I wear nasal prongs; haven't been using prongs recently" (09/19/2014)  . PAF (paroxysmal atrial fibrillation) (HCC)    TEE DCCV 09/23/2014  . Physical deconditioning   . Pilonidal cyst 1980's; 01/25/2013  . Scrotal abscess   . Type II diabetes mellitus [facility])     Patient Active Problem List   Diagnosis Date Noted  . C. difficile  diarrhea 10/12/2017  . Hypomagnesemia 10/12/2017  . Septic shock (HCC) 09/29/2017  . Acute encephalopathy   . Palliative care encounter   . UTI (urinary tract infection) 09/01/2017  . Acute metabolic encephalopathy 09/01/2017  . S/P AKA (above knee amputation) unilateral, right (HCC) 09/01/2017  . Atrial fibrillation with rapid ventricular response (HCC)   . Hyponatremia 04/11/2017  . ARF (acute renal failure) (HCC) 04/11/2017  . Pressure injury of skin 03/24/2017  . Below knee amputation status, right (HCC) 03/23/2017  . DKA (diabetic ketoacidoses) (HCC) 03/14/2017  . Elevated troponin 03/14/2017  . Diabetic polyneuropathy associated with type 2 diabetes mellitus (HCC)   . Osteoarthritis of both knees   . Diabetic nephropathy associated with type 2 diabetes mellitus (HCC)   . Bilateral lower leg cellulitis 01/22/2017  . Venous stasis 08/06/2016  . At high risk for falls 07/21/2016  . CHF exacerbation (HCC) 06/10/2016  . Lactic acidosis 06/09/2016  . Syncope and collapse 03/05/2016  . Acute on chronic renal insufficiency 03/05/2016  . Obesity 12/30/2015  . Scrotal swelling   . Foot fracture   . 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 Lower GI bleed   . Pressure ulcer 12/24/2015  . Acute kidney injury (HCC) 12/23/2015  . Back pain 12/23/2015  .  Fall   . Metatarsal fracture   . Traumatic rhabdomyolysis (HCC)   . Hyperkalemia   . Lisfranc dislocation   . Hyperglycemia   . Sepsis, unspecified organism (HCC) 09/08/2015  . Scrotal wall abscess 09/08/2015  . AKI (acute kidney injury) (HCC) 09/08/2015  . Chest wall pain   . On amiodarone therapy 12/08/2014  . Respiratory failure with hypoxia (HCC) 12/04/2014  . Hypoxia 12/04/2014  . Acute on chronic respiratory failure with hypoxia (HCC)   . Atrial fibrillation with RVR (HCC) 09/20/2014  . Acute on chronic diastolic CHF (congestive heart failure) (HCC) 09/19/2014  . Acute respiratory failure (HCC) 09/19/2014    . Abnormal nuclear cardiac imaging test 02/12/2014  . Dyspnea 02/12/2014  . Chronic anticoagulation- Coumadin 02/12/2014  . CKD (chronic kidney disease) stage 3, GFR 30-59 ml/min (HCC) 02/12/2014  . Chronic diastolic congestive heart failure (HCC) 02/11/2014  . Encounter for therapeutic drug monitoring 08/03/2013  . Pilonidal cyst 01/25/2013  . Perirectal cellulitis 09/06/2012  . Long term (current) use of anticoagulants 11/18/2011  . Obstructive sleep apnea- unable to tolerate c-pap 08/04/2011  . CAD S/P percutaneous coronary angioplasty 08/04/2011  . ANEMIA-IRON DEFICIENCY 01/06/2009  . OTHER AND UNSPECIFIED COAGULATION DEFECTS 01/06/2009  . Anemia 01/02/2009  . CARDIOVASCULAR FUNCTION STUDY, ABNORMAL 01/02/2009  . Essential hypertension, benign 10/22/2008  . Atrial fibrillation 10/22/2008  . Hyperlipidemia 09/23/2008  . Hypokalemia 09/23/2008  . Obesity, Class III, BMI 40-49.9 (morbid obesity) (HCC) 09/23/2008  . CELLULITIS AND ABSCESS OF LEG EXCEPT FOOT 09/23/2008    Past Surgical History:  Procedure Laterality Date  . ABDOMINAL SURGERY  ~ 1954   BENIGN TUMOR REMOVED  . AMPUTATION Right 03/23/2017   Procedure: RIGHT BELOW KNEE AMPUTATION;  Surgeon: Nadara Mustard, MD;  Location: [facility] OR;  Service: Orthopedics;  Laterality: Right;  . APPENDECTOMY    . BELOW KNEE LEG AMPUTATION Right 03/23/2017  . CARDIOVERSION  2010   [name] 09/19/2014  . CARDIOVERSION N/A 09/23/2014   Procedure: CARDIOVERSION;  Surgeon: Lewayne Bunting, MD;  Location: [facility] ENDOSCOPY;  Service: Cardiovascular;  Laterality: N/A;  . CARDIOVERSION N/A 12/11/2014   Procedure: CARDIOVERSION;  Surgeon: Thurmon Fair, MD;  Location: [facility] ENDOSCOPY;  Service: Cardiovascular;  Laterality: N/A;  . CATARACT EXTRACTION W/PHACO Right 11/15/2012   Procedure: CATARACT EXTRACTION PHACO AND INTRAOCULAR LENS PLACEMENT (IOC);  Surgeon: Shade Flood, MD;  Location: [facility] OR;  Service: Ophthalmology;  Laterality: Right;  . CATARACT  EXTRACTION W/PHACO Left 11/29/2012   Procedure: CATARACT EXTRACTION PHACO AND INTRAOCULAR LENS PLACEMENT (IOC);  Surgeon: Shade Flood, MD;  Location: [facility] OR;  Service: Ophthalmology;  Laterality: Left;  . CORONARY ANGIOPLASTY WITH STENT PLACEMENT  August 2012   RCA DES - [facility]  . DEBRIDEMENT  FOOT Left    debriding diabetic foot ulcers  . EYE SURGERY    . FOREIGN BODY REMOVAL Right 2014   heel,  splinter removal   . LEFT AND RIGHT HEART CATHETERIZATION WITH CORONARY ANGIOGRAM N/A 01/31/2014   Procedure: LEFT AND RIGHT HEART CATHETERIZATION WITH CORONARY ANGIOGRAM;  Surgeon: Micheline Chapman, MD;  Location: [facility] CATH LAB;  Service: Cardiovascular;  Laterality: N/A;  . PARS PLANA VITRECTOMY Left 06/05/2013   Procedure: PARS PLANA VITRECTOMY WITH 23 GAUGE with Endolaser(constellation);  Surgeon: Shade Flood, MD;  Location: [facility] OR;  Service: Ophthalmology;  Laterality: Left;  . PILONIDAL CYST EXCISION N/A 01/08/2013   Procedure: CYST EXCISION PILONIDAL EXTENSIVE;  Surgeon: Axel Filler, MD;  Location: [facility] OR;  Service: General;  Laterality: N/A;  . PILONIDAL CYST EXCISION  1980's   "in [place]"  . TEE WITHOUT CARDIOVERSION N/A 09/23/2014   Procedure: TRANSESOPHAGEAL ECHOCARDIOGRAM (TEE);  Surgeon: Lewayne Bunting, MD;  Location: [facility] ENDOSCOPY;  Service: Cardiovascular;  Laterality: N/A;  . TONSILLECTOMY          Home Medications    Prior to Admission medications   Medication Sig Start Date End Date Taking? Authorizing Provider  acetaminophen (TYLENOL) 325 MG tablet Take 2 tablets (650 mg total) by mouth every 4 (four) hours as needed for mild pain (temp > 101.5). 10/14/17   Shon Hale, MD  acetaminophen (TYLENOL) 500 MG tablet Take 500 mg by mouth every 6 (six) hours as needed for mild pain.     [provider]  albuterol (PROVENTIL) (2.5 MG/3ML) 0.083% nebulizer solution Take 3 mLs (2.5 mg total) by nebulization every 4 (four) hours as needed for  wheezing or shortness of breath. 10/14/17   Shon Hale, MD  aluminum-magnesium hydroxide-simethicone (MAALOX) 200-200-20 MG/5ML SUSP Take 30 mLs by mouth every 4 (four) hours as needed (heartburn).    [provider]  Amino Acids-Protein Hydrolys (FEEDING SUPPLEMENT, PRO-STAT SUGAR FREE 64,) LIQD Take 30 mLs by mouth 3 (three) times daily with meals. 04/21/17   Marguerita Merles Latif, DO  aspirin EC 81 MG EC tablet Take 1 tablet (81 mg total) by mouth daily. 06/17/16   Richarda Overlie, MD  atorvastatin (LIPITOR) 80 MG tablet Take 1 tablet (80 mg total) by mouth daily. 05/25/16   [name], MD  calcium carbonate (TUMS - DOSED IN MG ELEMENTAL CALCIUM) 500 MG chewable tablet Chew 2 tablets (400 mg of elemental calcium total) by mouth 2 (two) times daily as needed for indigestion or heartburn. 10/14/17   Shon Hale, MD  calcium-vitamin D (OSCAL WITH D) 500-200 MG-UNIT tablet Take 1 tablet by mouth daily.    [provider]  collagenase (SANTYL) ointment Apply 1 application topically daily.    [provider]  famotidine (PEPCID) 20 MG tablet Take 1 tablet (20 mg total) by mouth at bedtime. 10/14/17   Shon Hale, MD  feeding supplement, GLUCERNA SHAKE, (GLUCERNA SHAKE) LIQD Take 237 mLs by mouth 3 (three) times daily between meals. 04/21/17   Marguerita Merles Latif, DO  ferrous sulfate 325 (65 FE) MG tablet Take 1 tablet (325 mg total) by mouth 2 (two) times daily with a meal. 09/13/17   Alwyn Ren, MD  furosemide (LASIX) 20 MG tablet Take 1 tablet (20 mg total) by mouth daily. 10/14/17 11/13/17  Shon Hale, MD  gabapentin (NEURONTIN) 300 MG capsule Take 2 capsules (600 mg total) by mouth 2 (two) times daily. 05/25/16   Langeland, Kathaleen Grinder, MD  hydrocerin (EUCERIN) CREA Apply 1 application topically 3 (three) times daily. legs 05/25/16   Langeland, Dawn T, MD  insulin aspart (NOVOLOG FLEXPEN) 100 UNIT/ML FlexPen Inject 2-10 Units into the skin 4 (four) times  daily -  with meals and at bedtime. Per sliding scale.. 150-200=2 units 201-250=4 units 251-300=6 units 301-350=8 units 351-400=10 units Over 400 call MD    [provider]  insulin detemir (LEVEMIR) 100 UNIT/ML injection Inject 0.1 mLs (10 Units total) into the skin daily. 10/15/17   Shon Hale, MD  lidocaine (LMX) 4 % cream Apply topically daily as needed (during hydro). 04/21/17   Marguerita Merles Latif, DO  loperamide (IMODIUM) 2 MG capsule Take 1 capsule (2 mg total) by mouth every 6 (six) hours as needed for  diarrhea or loose stools. 09/13/17   Alwyn Ren, MD  magnesium oxide (MAG-OX) 400 (241.3 Mg) MG tablet Take 1 tablet (400 mg total) by mouth 2 (two) times daily. 10/14/17   Shon Hale, MD  metoprolol tartrate (LOPRESSOR) 25 MG tablet Take 1 tablet (25 mg total) by mouth 2 (two) times daily. 10/14/17 10/14/18  Shon Hale, MD  Multiple Vitamin (MULTIVITAMIN WITH MINERALS) TABS Take 1 tablet by mouth daily.    [provider]  ondansetron (ZOFRAN) 4 MG tablet Take 4 mg by mouth every 8 (eight) hours as needed for nausea or vomiting.    [provider]  Oxycodone HCl 20 MG TABS Take 1 tablet (20 mg total) by mouth every 4 (four) hours as needed (pain). 10/14/17   Shon Hale, MD  OXYGEN Inhale 2-3 L into the lungs continuous.    [provider]  potassium & sodium phosphates (PHOS-NAK) 280-160-250 MG PACK Take 1 packet by mouth 4 (four) times daily -  with meals and at bedtime. 09/13/17   Alwyn Ren, MD  protein supplement shake (PREMIER PROTEIN) LIQD Take 325 mLs (11 oz total) by mouth daily. 10/15/17   Shon Hale, MD  vitamin C (VITAMIN C) 250 MG tablet Take 1 tablet (250 mg total) by mouth 2 (two) times daily. 10/14/17   Emokpae, Courage, MD  XARELTO 20 MG TABS tablet TAKE 1 TABLET BY MOUTH EVERY DAY WITH SUPPER 04/05/17   Quentin Angst, MD    Family History Family History  Adopted: Yes  Problem Relation Age of  Onset  . Other Other        PT ADOPTED    Social History Social History   Tobacco Use  . Smoking status: Former Smoker    Packs/day: 1.00    Years: 20.00    Pack years: 20.00    Types: Cigarettes    Last attempt to quit: 07/13/1983    Years since quitting: 34.2  . Smokeless tobacco: Never Used  Substance Use Topics  . Alcohol use: No  . Drug use: No     Allergies   Patient has no known allergies.   Review of Systems Review of Systems  Unable to perform ROS: Mental status change     Physical Exam Updated Vital Signs BP 136/68   Pulse (!) 140   Temp (!) 100.4 F (38 C) (Rectal)   Resp (!) 23   Ht 1.829 m (6')   Wt 121.6 kg (268 lb)   SpO2 100%   BMI 36.35 kg/m   Physical Exam  Constitutional: He appears distressed.   obese  HENT:  Head: Normocephalic and atraumatic.  Right Ear: External ear normal.  Left Ear: External ear normal.  Eyes: Conjunctivae are normal. Right eye exhibits no discharge. Left eye exhibits no discharge. No scleral icterus.  Neck: Neck supple. No tracheal deviation present.  Cardiovascular: Normal rate, regular rhythm and intact distal pulses.  Pulmonary/Chest: Effort normal and breath sounds normal. No stridor. No respiratory distress. He has no wheezes. He has no rales.  Abdominal: Soft. Bowel sounds are normal. He exhibits no distension. There is no tenderness. There is no rebound and no guarding.  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  Neurological: He is alert. He is disoriented. No cranial nerve deficit (no facial droop, extraocular movements intact, no slurred speech) or sensory deficit. He exhibits normal muscle tone. He displays no  seizure activity. GCS eye subscore is 4. GCS verbal subscore is 4. GCS motor subscore is 5.  Generalized weakness  Skin: Skin is warm and dry. No rash noted.  Psychiatric: He has a normal mood and affect.  Nursing note and vitals reviewed.    ED Treatments / Results  Labs (all labs ordered are listed, but only abnormal results are displayed) Labs Reviewed  COMPREHENSIVE METABOLIC PANEL - Abnormal; Notable for the following components:      Result Value   Chloride 94 (*)    Glucose, Bld 191 (*)    Calcium 7.9 (*)    Total Protein 6.3 (*)    Albumin 1.7 (*)    ALT 9 (*)    Alkaline Phosphatase 132 (*)    All other components within normal limits  CBC WITH DIFFERENTIAL/PLATELET - Abnormal; Notable for the following components:   WBC 11.7 (*)    RBC 3.62 (*)    Hemoglobin 8.5 (*)    HCT 28.3 (*)    MCH 23.5 (*)    RDW 20.3 (*)    Neutro Abs 9.5 (*)    All other components within normal limits  PROTIME-INR - Abnormal; Notable for the following components:   Prothrombin Time 27.2 (*)    All other components within normal limits  URINALYSIS, ROUTINE W REFLEX MICROSCOPIC - Abnormal; Notable for the following components:   APPearance HAZY (*)    Hgb urine dipstick MODERATE (*)    Ketones, ur 5 (*)    Protein, ur 30 (*)    Leukocytes, UA SMALL (*)    Bacteria, UA MANY (*)    All other components within normal limits  I-STAT CG4 LACTIC ACID, ED - Abnormal; Notable for the following components:   Lactic Acid, Venous 4.10 (*)    All other components within normal limits  POC OCCULT BLOOD, ED - Abnormal; Notable for the following components:   Fecal Occult Bld POSITIVE (*)    All other components within normal limits  CULTURE, BLOOD (ROUTINE X 2)  CULTURE, BLOOD (ROUTINE X 2)  URINE CULTURE  I-STAT CG4 LACTIC ACID, ED  I-STAT CG4 LACTIC ACID, ED  I-STAT CG4 LACTIC ACID, ED  TYPE AND SCREEN    EKG EKG Interpretation  Date/Time:  Thursday October 20 2017 16:51:33  EDT Ventricular Rate:  138 PR Interval:    QRS Duration: 112 QT Interval:  318 QTC Calculation: 482 R Axis:   -40 Text Interpretation:  Atrial fibrillation Ventricular premature complex Borderline IVCD with LAD Borderline low voltage, extremity leads Abnormal R-wave progression, late transition Borderline repolarization abnormality Borderline prolonged QT interval No significant change since last tracing Confirmed by Linwood Dibbles [phone]) on 10/20/2017 5:20:17 PM   Radiology Dg Chest Port 1 View  Result Date: 10/20/2017 CLINICAL DATA:  Sepsis. EXAM: PORTABLE CHEST 1 VIEW COMPARISON:  10/02/2017 FINDINGS: The heart is mildly enlarged but stable. Stable tortuosity and calcification of the thoracic aorta. Mild central vascular congestion without overt pulmonary edema. No infiltrates or effusions. The bony thorax is intact. IMPRESSION: Cardiac enlargement and mild central vascular congestion without overt pulmonary edema. No definite infiltrates or effusions. Electronically Signed   By: Rudie Meyer M.D.   On: 10/20/2017 18:14    Procedures .Critical Care Performed by: Linwood Dibbles, MD Authorized by:  Linwood Dibbles, MD   Critical care provider statement:    Critical care time (minutes):  35   Critical care was time spent personally by me on the following activities:  Discussions with consultants, evaluation of patient's response to treatment, examination of patient, ordering and performing treatments and interventions, ordering and review of laboratory studies, ordering and review of radiographic studies, pulse oximetry, re-evaluation of patient's condition, obtaining history from patient or surrogate and review of old charts   (including critical care time)  Medications Ordered in ED Medications  vancomycin (VANCOCIN) IVPB 1000 mg/200 mL premix (1,000 mg Intravenous New Bag/Given 10/20/17 1824)  sodium chloride 0.9 % bolus 1,000 mL (1,000 mLs Intravenous New Bag/Given 10/20/17 1749)    And  sodium  chloride 0.9 % bolus 1,000 mL (1,000 mLs Intravenous New Bag/Given 10/20/17 1858)    And  sodium chloride 0.9 % bolus 400 mL (has no administration in time range)  metoprolol tartrate (LOPRESSOR) injection 2.5 mg (has no administration in time range)  piperacillin-tazobactam (ZOSYN) IVPB 3.375 g (3.375 g Intravenous New Bag/Given 10/20/17 1754)  morphine 4 MG/ML injection 4 mg (4 mg Intravenous Given 10/20/17 1807)     Initial Impression / Assessment and Plan / ED Course  I have reviewed the triage vital signs and the nursing notes.  Pertinent labs & imaging results that were available during my care of the patient were reviewed by me and considered in my medical decision making (see chart for details).  Clinical Course as of Oct 21 1906  Thu Oct 20, 2017  1642 Patient's exam is concerning for the possibility of sepsis.  It is possible the blood noted at the nursing facility may be related to his large sacral decubitus ulcer   [JK]  1718 Stable anemia, increased wbc  CBC with Differential(!) [JK]  1719 Concerning for sepsis  I-Stat CG4 Lactic Acid, ED(!!) [JK]  1749 UA with questionable uti.   [JK]  1903 Blood pressure is stabilized.  Patient is still tachycardic.  This may be more related to his known atrial fibrillation rather than sepsis at this point.  We will try dose of beta-blocker.  I will consult the medical service for admission   [JK]    Clinical Course User Index [JK] Linwood Dibbles, MD    Patient presented to the emergency room for evaluation of possible GI bleed infection.  I suspect the patient's symptoms are related to an infected decubitus ulcer.  He presented with a leukocytosis.  He has obvious devitalized tissue in his groin.  She does have fecal occult but I think this is from the bleeding from the decubitus.  Patient's hemoglobin is stable.  Blood pressure has normalized but he does remain tachycardic.  He has a known history of atrial fibrillation and I think part of  his tachycardia is related to the atrial fibrillation rather than sepsis alone.  Patient has been started on fluids and broad-spectrum antibiotics.  I will consult with the medical service to bring him into the stepdown unit.  I have ordered dose of beta-blockers to help with his tachycardia.  Final Clinical Impressions(s) / ED Diagnoses   Final diagnoses:  Pressure injury of skin of sacral region, unspecified injury stage  Sepsis, due to unspecified organism [facility])  Atrial fibrillation, rapid (HCC)      Linwood Dibbles, MD 10/20/17 1909

## 2017-10-20 NOTE — Progress Notes (Addendum)
Patient's condition is declining, BP falling despite fluid-resuscitation and diltiazem infusion was held. He has DNR paperwork at bedside. I've tried calling his sister, [name], but there was no answer and callback number was left.   Will give additional IVF bolus, though will need to watch for CHF as there was already vascular congestion on CXR prior to IVF. Will give hydrocortisone injection now as well.   Update 23:30 - BP continues to decline, will give another 500 cc bolus now, schedule hydrocortisone 50 mg IV q6h, likely use albumin next given concern for pulm edema. Attempts to reach his family (sister, [name]) remain unsuccessful.

## 2017-10-20 NOTE — ED Notes (Signed)
Date and time results received: 10/20/17 9:58 PM (use smartphrase ".now" to insert current time)  Test: Lactic Acid Critical Value: 2.2  Name of Provider Notified: Dr.Opyd  Orders Received? Or Actions Taken?:

## 2017-10-20 NOTE — ED Triage Notes (Signed)
Per EMS- patient is from [facility]. Patient was recently diagnosed with Sepsis. Patient has a large sacral decubitus and a foley with cloudy urine. patientis altered. Staff reports that the patient is normally alert and oriented x 4.

## 2017-10-20 NOTE — H&P (Addendum)
History and Physical    RAY GERVASI VVZ:[id number] DOB: 1951-05-05 DOA: 10/20/2017  PCP: [name], MD (Inactive)   Patient coming from: SNF   Chief Complaint: Fever, decreased responsiveness, ?GI bleeding   HPI: [name] is a 67 y.o. male with medical history significant for atrial fibrillation on Xarelto, coronary artery disease, insulin-dependent diabetes mellitus, chronic diastolic CHF, chronic low back pain, and microcytic anemia, now presenting to the emergency department from his SNF for evaluation of fevers and decreased responsiveness.  Patient was discharged from the hospital 1 week ago after management of septic shock and ventilator dependent respiratory failure, suspected secondary to aspiration pneumonia and complicated by C. difficile infection.  He had reportedly been recovering well at his SNF before he was noted to be listless and febrile today.  Per report, he is typically fully oriented and alert.  Patient had not been expressing any specific complaints leading up to this and there has not been any cough or vomiting noted.  No diarrhea reported.  Nursing reports seen some blood and having suspicion for possible GI bleed.  ED Course: Upon arrival to the ED, patient is found to be febrile to 38 C, saturating 90 on room air, slightly tachypneic, tachycardic to 140, and blood pressure 97 systolic.  EKG features atrial fibrillation with RVR, rate 138, PVC, and LAD.  Chest x-ray is notable for cardiomegaly and mild vascular congestion without overt edema.  Chemistry panel features a albumin of 1.7 and CBC is notable for a leukocytosis to 11,700 and stable microcytic anemia with hemoglobin 8.5.  Lactic acid is elevated to 4.10, INR is elevated to 2.54, and fecal occult blood testing is positive.  Blood and urine cultures were collected, 2.4 L of normal saline administered, and the patient was started on empiric vancomycin and Zosyn.  He was given 2.5 mg IV Lopressor for his  heart rate.  Blood pressure remained stable, heart rate has improved slightly, and the patient will be admitted to the stepdown unit for ongoing evaluation and management of sepsis suspected secondary to skin/soft tissue infection at the sacrum.  Review of Systems:  All other systems reviewed and apart from HPI, are negative.  Past Medical History:  Diagnosis Date  . Arthritis    "hands and lower back" (09/19/2014)  . CAD (coronary artery disease)    a. s/p PCI to RCA in 2012. b. prior cath in 01/2014 with elevated L/RH pressures, mild-mod CAD of LAD/LCx with patent RCA, normal EF, c/b CIN/CHF.  [name] Carpal tunnel syndrome, bilateral   . Cellulitis   . Chronic diastolic CHF (congestive heart failure) (HCC)   . Chronic kidney disease (CKD), stage III (moderate) (HCC)   . Chronic lower back pain   . Diabetes (HCC)   . DKA (diabetic ketoacidoses) (HCC) 03/2017  . History of blood transfusion ~ 1954   "related to OR"  . Hyperlipidemia   . Hypertension   . Hypoxia    a. Qualified for home O2 at [facility] in 09/2014.  [name] Lower GI bleed   . Microcytic anemia   . Morbid obesity (HCC)   . Neuropathy   . OSA (obstructive sleep apnea)    "I wear nasal prongs; haven't been using prongs recently" (09/19/2014)  . PAF (paroxysmal atrial fibrillation) (HCC)    TEE DCCV 09/23/2014  . Physical deconditioning   . Pilonidal cyst 1980's; 01/25/2013  . Scrotal abscess   . Type II diabetes mellitus [facility])     Past Surgical  History:  Procedure Laterality Date  . ABDOMINAL SURGERY  ~ 1954   BENIGN TUMOR REMOVED  . AMPUTATION Right 03/23/2017   Procedure: RIGHT BELOW KNEE AMPUTATION;  Surgeon: Nadara Mustard, MD;  Location: [facility] OR;  Service: Orthopedics;  Laterality: Right;  . APPENDECTOMY    . BELOW KNEE LEG AMPUTATION Right 03/23/2017  . CARDIOVERSION  2010   [name] 09/19/2014  . CARDIOVERSION N/A 09/23/2014   Procedure: CARDIOVERSION;  Surgeon: Lewayne Bunting, MD;  Location: [facility] ENDOSCOPY;  Service:  Cardiovascular;  Laterality: N/A;  . CARDIOVERSION N/A 12/11/2014   Procedure: CARDIOVERSION;  Surgeon: Thurmon Fair, MD;  Location: [facility] ENDOSCOPY;  Service: Cardiovascular;  Laterality: N/A;  . CATARACT EXTRACTION W/PHACO Right 11/15/2012   Procedure: CATARACT EXTRACTION PHACO AND INTRAOCULAR LENS PLACEMENT (IOC);  Surgeon: Shade Flood, MD;  Location: [facility] OR;  Service: Ophthalmology;  Laterality: Right;  . CATARACT EXTRACTION W/PHACO Left 11/29/2012   Procedure: CATARACT EXTRACTION PHACO AND INTRAOCULAR LENS PLACEMENT (IOC);  Surgeon: Shade Flood, MD;  Location: [facility] OR;  Service: Ophthalmology;  Laterality: Left;  . CORONARY ANGIOPLASTY WITH STENT PLACEMENT  August 2012   RCA DES - [facility]  . DEBRIDEMENT  FOOT Left    debriding diabetic foot ulcers  . EYE SURGERY    . FOREIGN BODY REMOVAL Right 2014   heel,  splinter removal   . LEFT AND RIGHT HEART CATHETERIZATION WITH CORONARY ANGIOGRAM N/A 01/31/2014   Procedure: LEFT AND RIGHT HEART CATHETERIZATION WITH CORONARY ANGIOGRAM;  Surgeon: Micheline Chapman, MD;  Location: [facility] CATH LAB;  Service: Cardiovascular;  Laterality: N/A;  . PARS PLANA VITRECTOMY Left 06/05/2013   Procedure: PARS PLANA VITRECTOMY WITH 23 GAUGE with Endolaser(constellation);  Surgeon: Shade Flood, MD;  Location: [facility] OR;  Service: Ophthalmology;  Laterality: Left;  . PILONIDAL CYST EXCISION N/A 01/08/2013   Procedure: CYST EXCISION PILONIDAL EXTENSIVE;  Surgeon: Axel Filler, MD;  Location: [facility] OR;  Service: General;  Laterality: N/A;  . PILONIDAL CYST EXCISION  1980's   "in [place]"  . TEE WITHOUT CARDIOVERSION N/A 09/23/2014   Procedure: TRANSESOPHAGEAL ECHOCARDIOGRAM (TEE);  Surgeon: Lewayne Bunting, MD;  Location: [facility] ENDOSCOPY;  Service: Cardiovascular;  Laterality: N/A;  . TONSILLECTOMY       reports that he quit smoking about 34 years ago. His smoking use included cigarettes. He has a 20.00 pack-year smoking history. He has never used  smokeless tobacco. He reports that he does not drink alcohol or use drugs.  No Known Allergies  Family History  Adopted: Yes  Problem Relation Age of Onset  . Other Other        PT ADOPTED     Prior to Admission medications   Medication Sig Start Date End Date Taking? Authorizing Provider  acetaminophen (TYLENOL) 325 MG tablet Take 2 tablets (650 mg total) by mouth every 4 (four) hours as needed for mild pain (temp > 101.5). Patient taking differently: Take 650 mg by mouth every 4 (four) hours as needed for fever (temp > 101.5).  10/14/17  Yes Emokpae, Courage, MD  acetaminophen (TYLENOL) 500 MG tablet Take 500 mg by mouth every 6 (six) hours as needed for mild pain.    Yes [provider]  albuterol (PROVENTIL) (2.5 MG/3ML) 0.083% nebulizer solution Take 3 mLs (2.5 mg total) by nebulization every 4 (four) hours as needed for wheezing or shortness of breath. 10/14/17  Yes Emokpae, Courage, MD  aluminum-magnesium hydroxide-simethicone (MAALOX) 200-200-20 MG/5ML SUSP Take 30 mLs by mouth every  4 (four) hours as needed (heartburn).   Yes [provider]  Amino Acids-Protein Hydrolys (FEEDING SUPPLEMENT, PRO-STAT SUGAR FREE 64,) LIQD Take 30 mLs by mouth 3 (three) times daily with meals. 04/21/17  Yes Sheikh, Omair Latif, DO  aspirin EC 81 MG EC tablet Take 1 tablet (81 mg total) by mouth daily. 06/17/16  Yes Richarda Overlie, MD  atorvastatin (LIPITOR) 80 MG tablet Take 1 tablet (80 mg total) by mouth daily. 05/25/16  Yes Langeland, Dawn T, MD  calcium carbonate (TUMS - DOSED IN MG ELEMENTAL CALCIUM) 500 MG chewable tablet Chew 2 tablets (400 mg of elemental calcium total) by mouth 2 (two) times daily as needed for indigestion or heartburn. 10/14/17  Yes Emokpae, Courage, MD  calcium-vitamin D (OSCAL WITH D) 500-200 MG-UNIT tablet Take 1 tablet by mouth daily.   Yes [provider]  collagenase (SANTYL) ointment Apply 1 application topically daily.   Yes [provider]   diphenhydrAMINE (BENADRYL) 25 MG tablet Take 25 mg by mouth every 4 (four) hours as needed for itching.   Yes [provider]  famotidine (PEPCID) 20 MG tablet Take 1 tablet (20 mg total) by mouth at bedtime. 10/14/17  Yes Emokpae, Courage, MD  feeding supplement, GLUCERNA SHAKE, (GLUCERNA SHAKE) LIQD Take 237 mLs by mouth 3 (three) times daily between meals. 04/21/17  Yes Sheikh, Omair Latif, DO  ferrous sulfate 325 (65 FE) MG tablet Take 1 tablet (325 mg total) by mouth 2 (two) times daily with a meal. 09/13/17  Yes Alwyn Ren, MD  furosemide (LASIX) 20 MG tablet Take 1 tablet (20 mg total) by mouth daily. 10/14/17 11/13/17 Yes Emokpae, Courage, MD  gabapentin (NEURONTIN) 300 MG capsule Take 2 capsules (600 mg total) by mouth 2 (two) times daily. 05/25/16  Yes Langeland, Dawn T, MD  hydrocerin (EUCERIN) CREA Apply 1 application topically 3 (three) times daily. legs 05/25/16  Yes Langeland, Dawn T, MD  insulin aspart (NOVOLOG FLEXPEN) 100 UNIT/ML FlexPen Inject 2-10 Units into the skin 4 (four) times daily -  with meals and at bedtime. Per sliding scale.. 150-200=2 units 201-250=4 units 251-300=6 units 301-350=8 units 351-400=10 units Over 400 call MD   Yes [provider]  insulin detemir (LEVEMIR) 100 UNIT/ML injection Inject 0.1 mLs (10 Units total) into the skin daily. Patient taking differently: Inject 10-14 Units into the skin 2 (two) times daily. 10 units qam at 0900, 14 units qhs 10/15/17  Yes Emokpae, Courage, MD  loperamide (IMODIUM) 2 MG capsule Take 1 capsule (2 mg total) by mouth every 6 (six) hours as needed for diarrhea or loose stools. 09/13/17  Yes Alwyn Ren, MD  magnesium oxide (MAG-OX) 400 (241.3 Mg) MG tablet Take 1 tablet (400 mg total) by mouth 2 (two) times daily. 10/14/17  Yes Emokpae, Courage, MD  metoprolol tartrate (LOPRESSOR) 25 MG tablet Take 1 tablet (25 mg total) by mouth 2 (two) times daily. 10/14/17 10/14/18 Yes Emokpae, Courage, MD    Multiple Vitamin (MULTIVITAMIN WITH MINERALS) TABS Take 1 tablet by mouth daily.   Yes [provider]  ondansetron (ZOFRAN) 4 MG tablet Take 4 mg by mouth every 8 (eight) hours as needed for nausea or vomiting.   Yes [provider]  Oxycodone HCl 20 MG TABS Take 1 tablet (20 mg total) by mouth every 4 (four) hours as needed (pain). 10/14/17  Yes Emokpae, Courage, MD  OXYGEN Inhale 2-3 L into the lungs continuous.   Yes [provider]  potassium &  sodium phosphates (PHOS-NAK) 280-160-250 MG PACK Take 1 packet by mouth 4 (four) times daily -  with meals and at bedtime. 09/13/17  Yes Alwyn Ren, MD  vitamin C (VITAMIN C) 250 MG tablet Take 1 tablet (250 mg total) by mouth 2 (two) times daily. 10/14/17  Yes Emokpae, Courage, MD  XARELTO 20 MG TABS tablet TAKE 1 TABLET BY MOUTH EVERY DAY WITH SUPPER 04/05/17  Yes Jegede, Olugbemiga E, MD  lidocaine (LMX) 4 % cream Apply topically daily as needed (during hydro). 04/21/17   Marguerita Merles Latif, DO  protein supplement shake (PREMIER PROTEIN) LIQD Take 325 mLs (11 oz total) by mouth daily. 10/15/17   Shon Hale, MD    Physical Exam: Vitals:   10/20/17 1915 10/20/17 1921 10/20/17 1930 10/20/17 1945  BP: 137/70 137/70 130/71 (!) 107/58  Pulse: (!) 137 (!) 140 (!) 142 (!) 120  Resp: (!) 25 (!) 25 13 17   Temp:      TempSrc:      SpO2: 100% 100% 100% 100%  Weight:      Height:          Constitutional: No apparent respiratory distress, obese, listless  Eyes: PERTLA, lids and conjunctivae normal ENMT: Mucous membranes are moist. Posterior pharynx clear of any exudate or lesions.   Neck: normal, supple, no masses, no thyromegaly Respiratory: clear to auscultation bilaterally, no wheezing, no crackles. Normal respiratory effort.    Cardiovascular: Rate ~120 and irregular. Pitting edema to LLE (s/p right BKA).  Abdomen: No distension, no tenderness, soft. Bowel sounds normal.  Musculoskeletal: no clubbing /  cyanosis. Status-post right BKA.   Skin: Wound at sacrum with necrotic tissue, surrounding erythema, grossly contaminated. Warm, dry, well-perfused. Neurologic: No facial asymmetry. PERRL, EOMI. Left DTR normal. Moving all extremities. Lethargic.  Psychiatric: Lethargic. Makes eye-contact, answers "yes/no" questions.      Labs on Admission: I have personally reviewed following labs and imaging studies  CBC: Recent Labs  Lab 10/14/17 1134 10/20/17 1630  WBC 10.7* 11.7*  NEUTROABS  --  9.5*  HGB 8.1* 8.5*  HCT 26.3* 28.3*  MCV 79.0 78.2  PLT 274 317   Basic Metabolic Panel: Recent Labs  Lab 10/14/17 0508 10/14/17 1134 10/20/17 1630  NA 135 134* 135  K 4.9 4.7 3.9  CL 101 99* 94*  CO2 28 27 28   GLUCOSE 191* 214* 191*  BUN 9 9 13   CREATININE 0.81 0.87 1.17  CALCIUM 7.8* 7.7* 7.9*   GFR: Estimated Creatinine Clearance: 84.8 mL/min (by C-G formula based on SCr of 1.17 mg/dL). Liver Function Tests: Recent Labs  Lab 10/20/17 1630  AST 22  ALT 9*  ALKPHOS 132*  BILITOT 0.6  PROT 6.3*  ALBUMIN 1.7*   No results for input(s): LIPASE, AMYLASE in the last 168 hours. No results for input(s): AMMONIA in the last 168 hours. Coagulation Profile: Recent Labs  Lab 10/20/17 1630  INR 2.54   Cardiac Enzymes: No results for input(s): CKTOTAL, CKMB, CKMBINDEX, TROPONINI in the last 168 hours. BNP (last 3 results) No results for input(s): PROBNP in the last 8760 hours. HbA1C: No results for input(s): HGBA1C in the last 72 hours. CBG: Recent Labs  Lab 10/13/17 2017 10/14/17 0723 10/14/17 1129 10/14/17 1700 10/14/17 2258  GLUCAP 159* 200* 218* 116* 117*   Lipid Profile: No results for input(s): CHOL, HDL, LDLCALC, TRIG, CHOLHDL, LDLDIRECT in the last 72 hours. Thyroid Function Tests: No results for input(s): TSH, T4TOTAL, FREET4, T3FREE, THYROIDAB in the last 72  hours. Anemia Panel: No results for input(s): VITAMINB12, FOLATE, FERRITIN, TIBC, IRON, RETICCTPCT in  the last 72 hours. Urine analysis:    Component Value Date/Time   COLORURINE YELLOW 10/20/2017 1659   APPEARANCEUR HAZY (A) 10/20/2017 1659   LABSPEC 1.012 10/20/2017 1659   PHURINE 7.0 10/20/2017 1659   GLUCOSEU NEGATIVE 10/20/2017 1659   HGBUR MODERATE (A) 10/20/2017 1659   BILIRUBINUR NEGATIVE 10/20/2017 1659   KETONESUR 5 (A) 10/20/2017 1659   PROTEINUR 30 (A) 10/20/2017 1659   UROBILINOGEN 1.0 11/27/2014 0755   NITRITE NEGATIVE 10/20/2017 1659   LEUKOCYTESUR SMALL (A) 10/20/2017 1659   Sepsis Labs: @LABRCNTIP (procalcitonin:4,lacticidven:4) )No results found for this or any previous visit (from the past 240 hour(s)).   Radiological Exams on Admission: Dg Chest Port 1 View  Result Date: 10/20/2017 CLINICAL DATA:  Sepsis. EXAM: PORTABLE CHEST 1 VIEW COMPARISON:  10/02/2017 FINDINGS: The heart is mildly enlarged but stable. Stable tortuosity and calcification of the thoracic aorta. Mild central vascular congestion without overt pulmonary edema. No infiltrates or effusions. The bony thorax is intact. IMPRESSION: Cardiac enlargement and mild central vascular congestion without overt pulmonary edema. No definite infiltrates or effusions. Electronically Signed   By: 10/04/2017 M.D.   On: 10/20/2017 18:14    EKG: Independently reviewed. Atrial fibrillation with RVR (rate 138), PVC, LAD.   Assessment/Plan  1. 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 Atrial fibrillation with RVR  - He has hx of a fib and is in atrial fib with rate 130's on arrival  - HR remained 130's despite fluid-resuscitation in ED  - Continue cardiac monitoring, start diltiazem infusion and titrate as needed  - CHADS-VASc is at least 70 (age, CHF, DM, CAD) and he takes Xarelto, but this will be held initially while evaluating for possible GIB as discussed below    3. Microcytic anemia; occult blood in stool  - Hgb is stable on admission at 8.5 with MCV 78.2  - This has been attributed to IDA and he takes iron-supplements at the SNF  - There was report of blood in sheets at SNF concerning for GIB, but he also has large decubitus ulcer  - FOBT is positive in ED  - Hold Xarelto initially, monitor for gross bleeding, repeat CBC in am   4. Chronic diastolic CHF  - Appears hypovolemic on admission in setting of sepsis and was given 1.4 L NS in ED - Hold Lasix, continue a cautious IVF hydration, follow daily wt and I/O's    5. Insulin-dependent DM  - A1c was 6.1% earlier this month  - Managed at his SNF with Levemir 10 units qAM and 14 units qPM, plus Novolog 2-10 QID  - Follow CBG's, continue Levemir with 5 units BID, and continue Novolog sliding-scale    6. CAD - Denies chest pain  - EKG with a fib RVR  - Continue statin, hold ASA initially while following blood  counts  given occult GIB    7. Acute encephalopathy  - Presents from SNF with fevers and AMS  - No focal neurologic deficits identified  - Seems to be improving in ED, not answering questions initially, now answering simple questions appropriately  - Likely secondary to #1; if fails to resolve with treatment of sepsis, will broaden workup    DVT prophylaxis: Xarelto pta, held with concern for possible GIB  Code Status: DNR, paperwork at bedside Family Communication: Discussed with patient; sister, [name], did not answer phone, message with callback  number left Consults called: none Admission status: Inpatient    Briscoe Deutscher, MD [name] Pager [phone]  If 7PM-7AM, please contact night-coverage [email or web address] Password [facility]  10/20/2017, 7:56 PM

## 2017-10-21 LAB — CBC WITH DIFFERENTIAL/PLATELET
BASOS ABS: 0 10*3/uL (ref 0.0–0.1)
BASOS PCT: 0 %
EOS PCT: 1 %
Eosinophils Absolute: 0.1 10*3/uL (ref 0.0–0.7)
HEMATOCRIT: 23.9 % — AB (ref 39.0–52.0)
Hemoglobin: 7.4 g/dL — ABNORMAL LOW (ref 13.0–17.0)
LYMPHS PCT: 6 %
Lymphs Abs: 0.7 10*3/uL (ref 0.7–4.0)
MCH: 23.8 pg — ABNORMAL LOW (ref 26.0–34.0)
MCHC: 31 g/dL (ref 30.0–36.0)
MCV: 76.8 fL — AB (ref 78.0–100.0)
MONO ABS: 0.4 10*3/uL (ref 0.1–1.0)
Monocytes Relative: 4 %
NEUTROS ABS: 9.6 10*3/uL — AB (ref 1.7–7.7)
Neutrophils Relative %: 89 %
PLATELETS: 262 10*3/uL (ref 150–400)
RBC: 3.11 MIL/uL — AB (ref 4.22–5.81)
RDW: 20 % — AB (ref 11.5–15.5)
WBC: 10.8 10*3/uL — AB (ref 4.0–10.5)

## 2017-10-21 LAB — GLUCOSE, CAPILLARY
GLUCOSE-CAPILLARY: 210 mg/dL — AB (ref 65–99)
GLUCOSE-CAPILLARY: 196 mg/dL — AB (ref 65–99)
GLUCOSE-CAPILLARY: 228 mg/dL — AB (ref 65–99)
GLUCOSE-CAPILLARY: 221 mg/dL — AB (ref 65–99)
GLUCOSE-CAPILLARY: 252 mg/dL — AB (ref 65–99)
GLUCOSE-CAPILLARY: 271 mg/dL — AB (ref 65–99)
Glucose-Capillary: 231 mg/dL — ABNORMAL HIGH (ref 65–99)

## 2017-10-21 LAB — BASIC METABOLIC PANEL
ANION GAP: 12 (ref 5–15)
BUN: 12 mg/dL (ref 6–20)
CALCIUM: 7.3 mg/dL — AB (ref 8.9–10.3)
CO2: 23 mmol/L (ref 22–32)
Chloride: 100 mmol/L — ABNORMAL LOW (ref 101–111)
Creatinine, Ser: 1 mg/dL (ref 0.61–1.24)
GFR calc Af Amer: >60 mL/min (ref >60)
GLUCOSE: 222 mg/dL — AB (ref 65–99)
POTASSIUM: 3.6 mmol/L (ref 3.5–5.1)
SODIUM: 135 mmol/L (ref 135–145)

## 2017-10-21 LAB — MRSA PCR SCREENING: MRSA BY PCR: POSITIVE — AB

## 2017-10-21 LAB — MAGNESIUM: Magnesium: 1.6 mg/dL — ABNORMAL LOW (ref 1.7–2.4)

## 2017-10-21 LAB — LACTIC ACID, PLASMA: LACTIC ACID, VENOUS: 0.9 mmol/L (ref 0.5–1.9)

## 2017-10-21 MED ORDER — PANTOPRAZOLE SODIUM 40 MG PO TBEC
40.0000 mg | DELAYED_RELEASE_TABLET | Freq: Every day | ORAL | Status: DC
  Administered 2017-10-21 – 2017-11-01 (×11): 40 mg via ORAL
  Filled 2017-10-21 (×11): qty 1

## 2017-10-21 MED ORDER — HYDROMORPHONE HCL 1 MG/ML IJ SOLN
1.0000 mg | INTRAMUSCULAR | Status: DC | PRN
  Administered 2017-10-21 – 2017-10-24 (×22): 1 mg via INTRAVENOUS
  Filled 2017-10-21 (×21): qty 1

## 2017-10-21 MED ORDER — FENTANYL CITRATE (PF) 100 MCG/2ML IJ SOLN
50.0000 ug | INTRAMUSCULAR | Status: DC | PRN
  Administered 2017-10-21 (×2): 100 ug via INTRAVENOUS
  Filled 2017-10-21 (×2): qty 2

## 2017-10-21 MED ORDER — ALBUMIN HUMAN 25 % IV SOLN
12.5000 g | Freq: Four times a day (QID) | INTRAVENOUS | Status: AC
  Administered 2017-10-21 (×3): 12.5 g via INTRAVENOUS
  Filled 2017-10-21 (×3): qty 50

## 2017-10-21 MED ORDER — DAKINS (1/4 STRENGTH) 0.125 % EX SOLN
Freq: Every day | CUTANEOUS | Status: AC
  Administered 2017-10-21 – 2017-10-23 (×3)
  Filled 2017-10-21: qty 473

## 2017-10-21 MED ORDER — POLYVINYL ALCOHOL 1.4 % OP SOLN
1.0000 [drp] | OPHTHALMIC | Status: DC | PRN
  Administered 2017-10-21: 1 [drp] via OPHTHALMIC
  Filled 2017-10-21: qty 15

## 2017-10-21 MED ORDER — SODIUM CHLORIDE 0.9 % IV SOLN
INTRAVENOUS | Status: DC
  Administered 2017-10-21 (×2): via INTRAVENOUS
  Administered 2017-10-22: 1000 mL via INTRAVENOUS
  Administered 2017-10-23: 05:00:00 via INTRAVENOUS

## 2017-10-21 MED ORDER — FENTANYL CITRATE (PF) 100 MCG/2ML IJ SOLN
100.0000 ug | INTRAMUSCULAR | Status: DC | PRN
  Administered 2017-10-21 (×6): 100 ug via INTRAVENOUS
  Filled 2017-10-21 (×7): qty 2

## 2017-10-21 MED ORDER — MAGNESIUM SULFATE 2 GM/50ML IV SOLN
2.0000 g | Freq: Once | INTRAVENOUS | Status: AC
  Administered 2017-10-21: 2 g via INTRAVENOUS
  Filled 2017-10-21: qty 50

## 2017-10-21 NOTE — Progress Notes (Signed)
ED TO INPATIENT HANDOFF REPORT  Name/Age/Gender [name] 67 y.o. male  Code Status    Code Status Orders  (From admission, onward)        Start     Ordered   10/20/17 1953  Do not attempt resuscitation (DNR)  Continuous    Question Answer Comment  In the event of cardiac or respiratory ARREST Do not call a "code blue"   In the event of cardiac or respiratory ARREST Do not perform Intubation, CPR, defibrillation or ACLS   In the event of cardiac or respiratory ARREST Use medication by any route, position, wound care, and other measures to relive pain and suffering. May use oxygen, suction and manual treatment of airway obstruction as needed for comfort.      10/20/17 1956    Code Status History    Date Active Date Inactive Code Status Order ID Comments User Context   09/29/2017 0457 10/15/2017 0232 Full Code [id number]  Flora Lipps, MD ED   09/08/2017 1026 09/13/2017 2008 DNR [id number]  Georgette Shell, MD Inpatient   09/01/2017 0535 09/08/2017 1026 Full Code [id number]  Ivor Costa, MD ED   04/11/2017 0425 04/22/2017 0201 DNR [id number]  Jani Gravel, MD ED   03/23/2017 1511 03/28/2017 2216 DNR [id number]  Newt Minion, MD Inpatient   03/14/2017 0831 03/18/2017 2212 Full Code [id number]  Waldemar Dickens, MD ED   03/14/2017 0831 03/14/2017 0831 Full Code [id number]  Waldemar Dickens, MD ED   01/22/2017 0857 01/25/2017 2139 Full Code [id number]  Shon Millet, DO Inpatient   06/10/2016 0946 06/18/2016 0030 Full Code [id number]  Toy Baker, MD Inpatient   12/23/2015 2049 12/30/2015 1821 DNR [id number]  Rogue Bussing, MD Inpatient   09/08/2015 2156 09/17/2015 2240 Full Code [id number]  Theressa Millard, MD Inpatient   07/08/2015 2124 07/10/2015 1811 Full Code [id number]  Ivor Costa, MD ED   12/04/2014 2303 12/11/2014 2124 Full Code [id number]  Jani Gravel, MD Inpatient   09/19/2014 1806 09/24/2014 1845 Full Code [id number]  Robet Leu Inpatient   02/11/2014 1557 02/14/2014  1629 Full Code [id number]  Burtis Junes, NP Inpatient   09/07/2012 0131 09/10/2012 1731 Full Code [id number]  [name], MD Inpatient      Home/SNF/Other Skilled nursing facility  Chief Complaint Blood in stool   Level of Care/Admitting Diagnosis ED Disposition    ED Disposition Condition [place]: [facility] [[id number]]  Level of Care: ICU [6]  Diagnosis: Sepsis [facility]) [[id number]]  Admitting Physician: Vianne Bulls [[id number]]  Attending Physician: Vianne Bulls [[id number]]  Estimated length of stay: past midnight tomorrow  Certification:: I certify this patient will need inpatient services for at least 2 midnights  PT Class (Do Not Modify): Inpatient [101]  PT Acc Code (Do Not Modify): Private [1]       Medical History Past Medical History:  Diagnosis Date  . Arthritis    "hands and lower back" (09/19/2014)  . CAD (coronary artery disease)    a. s/p PCI to RCA in 2012. b. prior cath in 01/2014 with elevated L/RH pressures, mild-mod CAD of LAD/LCx with patent RCA, normal EF, c/b CIN/CHF.  [name] Carpal tunnel syndrome, bilateral   . Cellulitis   . Chronic diastolic CHF (congestive heart failure) ([place])   . Chronic kidney disease (CKD), stage III (moderate) (HCC)   . Chronic lower back pain   . Diabetes ([place])   .  DKA (diabetic ketoacidoses) ([place]) 03/2017  . History of blood transfusion ~ 1954   "related to OR"  . Hyperlipidemia   . Hypertension   . Hypoxia    a. Qualified for home O2 at DC in 09/2014.  [name] Lower GI bleed   . Microcytic anemia   . Morbid obesity ([place])   . Neuropathy   . OSA (obstructive sleep apnea)    "I wear nasal prongs; haven't been using prongs recently" (09/19/2014)  . PAF (paroxysmal atrial fibrillation) ([place])    TEE DCCV 09/23/2014  . Physical deconditioning   . Pilonidal cyst 1980's; 01/25/2013  . Scrotal abscess   . Type II diabetes mellitus ([place])     Allergies No Known Allergies  IV  Location/Drains/Wounds Patient Lines/Drains/Airways Status   Active Line/Drains/Airways    Name:   Placement date:   Placement time:   Site:   Days:   Peripheral IV 10/20/17 Left Antecubital   10/20/17    1627    Antecubital   1   Urethral Catheter Whitney RN Straight-tip 16 Fr.   09/02/17    1516    Straight-tip   49   Incision (Closed) Scrotum Medial;Lower   -    -        Incision (Closed) 09/01/17 Leg Right   09/01/17    2025     50   Pressure Injury 09/29/17 Unstageable - Full thickness tissue loss in which the base of the ulcer is covered by slough (yellow, tan, gray, green or brown) and/or eschar (tan, brown or black) in the wound bed. WOC assessment in ED 09/01/17, Initial assessme   09/29/17    0830     22   Wound / Incision (Open or Dehisced) 09/15/15 Incision - Open Scrotum Posterior   09/15/15    2014    Scrotum   767   Wound / Incision (Open or Dehisced) 06/13/16 Leg Bilateral scattered dry scabs and flaking   06/13/16    2151    Leg   495   Wound / Incision (Open or Dehisced) 04/12/17 Incision - Dehisced Tibial Right stump dehis 04/12/17    04/12/17    1328    Tibial   192   Wound / Incision (Open or Dehisced) 10/06/17 Scrotum Left;Right three 0.5 cm areas on bottom of scrotum    10/06/17    -    Scrotum   15   Wound / Incision (Open or Dehisced) 10/06/17 Anterior;Left 2cm abrasion on top of left foot    10/06/17    -    -   15          Labs/Imaging Results for orders placed or performed during the hospital encounter of 10/20/17 (from the past 48 hour(s))  Type and screen [place]     Status: None   Collection Time: 10/20/17  4:22 PM  Result Value Ref Range   ABO/RH(D) A NEG    Antibody Screen NEG    Sample Expiration      10/23/2017 Performed at [facility], [place] [address] [place], [place] [address]   Comprehensive metabolic panel     Status: Abnormal   Collection Time: 10/20/17  4:30 PM  Result Value Ref Range   Sodium 135 135 -  145 mmol/L   Potassium 3.9 3.5 - 5.1 mmol/L   Chloride 94 (L) 101 - 111 mmol/L   CO2 28 22 - 32 mmol/L   Glucose, Bld 191 (  H) 65 - 99 mg/dL   BUN 13 6 - 20 mg/dL   Creatinine, Ser 1.17 0.61 - 1.24 mg/dL   Calcium 7.9 (L) 8.9 - 10.3 mg/dL   Total Protein 6.3 (L) 6.5 - 8.1 g/dL   Albumin 1.7 (L) 3.5 - 5.0 g/dL   AST 22 15 - 41 U/L   ALT 9 (L) 17 - 63 U/L   Alkaline Phosphatase 132 (H) 38 - 126 U/L   Total Bilirubin 0.6 0.3 - 1.2 mg/dL   GFR calc non Af Amer >60 >60 mL/min   GFR calc Af Amer >60 >60 mL/min    Comment: (NOTE) The eGFR has been calculated using the CKD EPI equation. This calculation has not been validated in all clinical situations. eGFR's persistently <60 mL/min signify possible Chronic Kidney Disease.    Anion gap 13 5 - 15    Comment: Performed at [facility], [place] [address], [place], [place] [address]  I-Stat CG4 Lactic Acid, ED     Status: Abnormal   Collection Time: 10/20/17  4:30 PM  Result Value Ref Range   Lactic Acid, Venous 4.10 (HH) 0.5 - 1.9 mmol/L   Comment NOTIFIED PHYSICIAN   CBC with Differential     Status: Abnormal   Collection Time: 10/20/17  4:30 PM  Result Value Ref Range   WBC 11.7 (H) 4.0 - 10.5 K/uL   RBC 3.62 (L) 4.22 - 5.81 MIL/uL   Hemoglobin 8.5 (L) 13.0 - 17.0 g/dL   HCT 28.3 (L) 39.0 - 52.0 %   MCV 78.2 78.0 - 100.0 fL   MCH 23.5 (L) 26.0 - 34.0 pg   MCHC 30.0 30.0 - 36.0 g/dL   RDW 20.3 (H) 11.5 - 15.5 %   Platelets 317 150 - 400 K/uL   Neutrophils Relative % 81 %   Neutro Abs 9.5 (H) 1.7 - 7.7 K/uL   Lymphocytes Relative 11 %   Lymphs Abs 1.2 0.7 - 4.0 K/uL   Monocytes Relative 7 %   Monocytes Absolute 0.9 0.1 - 1.0 K/uL   Eosinophils Relative 1 %   Eosinophils Absolute 0.1 0.0 - 0.7 K/uL   Basophils Relative 0 %   Basophils Absolute 0.0 0.0 - 0.1 K/uL    Comment: Performed at [facility], [place] [address], [place], [place] [address]  Protime-INR     Status: Abnormal   Collection  Time: 10/20/17  4:30 PM  Result Value Ref Range   Prothrombin Time 27.2 (H) 11.4 - 15.2 seconds   INR 2.54     Comment: Performed at [facility], [place] [address] [place], [place] [address]  Urinalysis, Routine w reflex microscopic     Status: Abnormal   Collection Time: 10/20/17  4:59 PM  Result Value Ref Range   Color, Urine YELLOW YELLOW   APPearance HAZY (A) CLEAR   Specific Gravity, Urine 1.012 1.005 - 1.030   pH 7.0 5.0 - 8.0   Glucose, UA NEGATIVE NEGATIVE mg/dL   Hgb urine dipstick MODERATE (A) NEGATIVE   Bilirubin Urine NEGATIVE NEGATIVE   Ketones, ur 5 (A) NEGATIVE mg/dL   Protein, ur 30 (A) NEGATIVE mg/dL   Nitrite NEGATIVE NEGATIVE   Leukocytes, UA SMALL (A) NEGATIVE   RBC / HPF TOO NUMEROUS TO COUNT 0 - 5 RBC/hpf   WBC, UA 6-30 0 - 5 WBC/hpf   Bacteria, UA MANY (A) NONE SEEN   Squamous Epithelial / LPF NONE SEEN NONE SEEN  Mucus PRESENT    Budding Yeast PRESENT     Comment: Performed at [facility], [place] [address] [place], [place] [address]  POC occult blood, ED     Status: Abnormal   Collection Time: 10/20/17  6:09 PM  Result Value Ref Range   Fecal Occult Bld POSITIVE (A) NEGATIVE  CBG monitoring, ED     Status: Abnormal   Collection Time: 10/20/17  8:04 PM  Result Value Ref Range   Glucose-Capillary 197 (H) 65 - 99 mg/dL  Lactic acid, plasma     Status: Abnormal   Collection Time: 10/20/17  8:54 PM  Result Value Ref Range   Lactic Acid, Venous 2.2 (HH) 0.5 - 1.9 mmol/L    Comment: CRITICAL RESULT CALLED TO, READ BACK BY AND VERIFIED WITH: I COGGINS,RN @2157  10/20/17 MKELLY Performed at [facility], [place] [address], [place], [place] [address]   Procalcitonin     Status: None   Collection Time: 10/20/17  8:54 PM  Result Value Ref Range   Procalcitonin 0.15 ng/mL    Comment:        Interpretation: PCT (Procalcitonin) <= 0.5 ng/mL: Systemic infection (sepsis) is not likely. Local bacterial  infection is possible. (NOTE)       Sepsis PCT Algorithm           Lower Respiratory Tract                                      Infection PCT Algorithm    ----------------------------     ----------------------------         PCT < 0.25 ng/mL                PCT < 0.10 ng/mL         Strongly encourage             Strongly discourage   discontinuation of antibiotics    initiation of antibiotics    ----------------------------     -----------------------------       PCT 0.25 - 0.50 ng/mL            PCT 0.10 - 0.25 ng/mL               OR       >80% decrease in PCT            Discourage initiation of                                            antibiotics      Encourage discontinuation           of antibiotics    ----------------------------     -----------------------------         PCT >= 0.50 ng/mL              PCT 0.26 - 0.50 ng/mL               AND        <80% decrease in PCT             Encourage initiation of  antibiotics       Encourage continuation           of antibiotics    ----------------------------     -----------------------------        PCT >= 0.50 ng/mL                  PCT > 0.50 ng/mL               AND         increase in PCT                  Strongly encourage                                      initiation of antibiotics    Strongly encourage escalation           of antibiotics                                     -----------------------------                                           PCT <= 0.25 ng/mL                                                 OR                                        > 80% decrease in PCT                                     Discontinue / Do not initiate                                             antibiotics Performed at [place] [address] [place], [place] [address]    Dg Chest Port 1 View  Result Date: 10/20/2017 CLINICAL DATA:  Sepsis. EXAM: PORTABLE CHEST 1 VIEW  COMPARISON:  10/02/2017 FINDINGS: The heart is mildly enlarged but stable. Stable tortuosity and calcification of the thoracic aorta. Mild central vascular congestion without overt pulmonary edema. No infiltrates or effusions. The bony thorax is intact. IMPRESSION: Cardiac enlargement and mild central vascular congestion without overt pulmonary edema. No definite infiltrates or effusions. Electronically Signed   By: Marijo Sanes M.D.   On: 10/20/2017 18:14    Pending Labs Unresulted Labs (From admission, onward)   Start     Ordered   10/21/17 6761  Basic metabolic panel  Tomorrow morning,   R     10/20/17 1956   10/21/17 0500  CBC WITH DIFFERENTIAL  Tomorrow morning,   R     10/20/17 1956   10/21/17 0500  Magnesium  Tomorrow morning,   R  10/20/17 1956   10/21/17 0500  Creatinine, serum  Daily,   R     10/20/17 2043   10/20/17 1955  Lactic acid, plasma  Now then every 3 hours,   STAT     10/20/17 1956   10/20/17 1630  Urine culture  STAT,   STAT     10/20/17 1636   10/20/17 1610  Culture, blood (Routine x 2)  BLOOD CULTURE X 2,   STAT     10/20/17 1610      Vitals/Pain Today's Vitals   10/20/17 2330 10/20/17 2335 10/20/17 2340 10/20/17 2345  BP: (!) 86/46 (!) 83/46 (!) 91/46 (!) 96/52  Pulse: 94 (!) 104 (!) 103 (!) 111  Resp: (!) 31 (!) 24 (!) 24 (!) 24  Temp: (!) 101.5 F (38.6 C) (!) 101.3 F (38.5 C) (!) 101.3 F (38.5 C) (!) 101.1 F (38.4 C)  TempSrc:      SpO2: 96% 97% 97% 97%  Weight:      Height:      PainSc:        Isolation Precautions No active isolations  Medications Medications  albuterol (PROVENTIL) (2.5 MG/3ML) 0.083% nebulizer solution 2.5 mg (has no administration in time range)  feeding supplement (PRO-STAT SUGAR FREE 64) liquid 30 mL (has no administration in time range)  atorvastatin (LIPITOR) tablet 80 mg (has no administration in time range)  calcium-vitamin D (OSCAL WITH D) 500-200 MG-UNIT per tablet 1 tablet (has no administration in time  range)  diphenhydrAMINE (BENADRYL) capsule 25 mg (has no administration in time range)  feeding supplement (GLUCERNA SHAKE) (GLUCERNA SHAKE) liquid 237 mL (has no administration in time range)  gabapentin (NEURONTIN) capsule 600 mg (has no administration in time range)  magnesium oxide (MAG-OX) tablet 400 mg (has no administration in time range)  metoprolol tartrate (LOPRESSOR) tablet 25 mg (has no administration in time range)  multivitamin with minerals tablet 1 tablet (has no administration in time range)  potassium & sodium phosphates (PHOS-NAK) 280-160-250 MG packet 1 packet (has no administration in time range)  insulin detemir (LEVEMIR) injection 5 Units (5 Units Subcutaneous Given 10/20/17 2211)  insulin aspart (novoLOG) injection 0-9 Units (2 Units Subcutaneous Given 10/20/17 2015)  sodium chloride flush (NS) 0.9 % injection 3 mL (3 mLs Intravenous Given 10/20/17 2348)  0.9 %  sodium chloride infusion ( Intravenous Rate/Dose Change 10/20/17 2349)  acetaminophen (TYLENOL) tablet 650 mg ( Oral See Alternative 10/20/17 2152)    Or  acetaminophen (TYLENOL) suppository 650 mg (650 mg Rectal Given 10/20/17 2152)  ondansetron (ZOFRAN) tablet 4 mg (has no administration in time range)    Or  ondansetron (ZOFRAN) injection 4 mg (has no administration in time range)  HYDROmorphone (DILAUDID) injection 0.5-1 mg (1 mg Intravenous Given 10/20/17 2004)  diltiazem (CARDIZEM) 100 mg in dextrose 5% 176m (1 mg/mL) infusion (0 mg/hr Intravenous Paused 10/20/17 2048)  pantoprazole (PROTONIX) injection 40 mg (40 mg Intravenous Given 10/20/17 2242)  vancomycin (VANCOCIN) IVPB 1000 mg/200 mL premix (0 mg Intravenous Stopped 10/20/17 2252)  meropenem (MERREM) 1 g in sodium chloride 0.9 % 100 mL IVPB (1 g Intravenous New Bag/Given 10/20/17 2346)  white petrolatum (VASELINE) gel ( Topical Given 10/20/17 2130)  hydrocortisone sodium succinate (SOLU-CORTEF) 100 MG injection 50 mg (has no administration in time range)   piperacillin-tazobactam (ZOSYN) IVPB 3.375 g (0 g Intravenous Stopped 10/20/17 1830)  vancomycin (VANCOCIN) IVPB 1000 mg/200 mL premix (0 mg Intravenous Stopped 10/20/17 1924)  sodium chloride 0.9 % bolus  1,000 mL (0 mLs Intravenous Stopped 10/20/17 1912)    And  sodium chloride 0.9 % bolus 1,000 mL (0 mLs Intravenous Stopped 10/20/17 1941)    And  sodium chloride 0.9 % bolus 400 mL (0 mLs Intravenous Stopped 10/20/17 1950)  morphine 4 MG/ML injection 4 mg (4 mg Intravenous Given 10/20/17 1807)  metoprolol tartrate (LOPRESSOR) injection 2.5 mg (2.5 mg Intravenous Given 10/20/17 1938)  sodium chloride 0.9 % bolus 500 mL (0 mLs Intravenous Stopped 10/20/17 2118)  hydrocortisone sodium succinate (SOLU-CORTEF) 100 MG injection 100 mg (100 mg Intravenous Given 10/20/17 2117)  sodium chloride 0.9 % bolus 500 mL (500 mLs Intravenous Bolus from Bag 10/20/17 2331)  albumin human 25 % solution 25 g (25 g Intravenous New Bag/Given 10/20/17 2340)    Mobility non-ambulatory

## 2017-10-21 NOTE — Progress Notes (Signed)
Pt. requested that RN call his sister [name] and inform her of his admission to the SD unit. Left VM.

## 2017-10-21 NOTE — Progress Notes (Signed)
PROGRESS NOTE    [name]  YJE:[id number] DOB: 08-10-1950 DOA: 10/20/2017 PCP: [name], MD (Inactive)     Brief Narrative:  [name] is a 67 y.o. male with medical history significant for atrial fibrillation on Xarelto, coronary artery disease, insulin-dependent diabetes mellitus, chronic diastolic CHF, chronic low back pain, and microcytic anemia, now presenting to the emergency department from his SNF for evaluation of fevers and decreased responsiveness.  Patient was discharged from the hospital 1 week ago after management of septic shock and ventilator dependent respiratory failure, suspected secondary to aspiration pneumonia and complicated by C. difficile infection.  He had reportedly been recovering well at his SNF before he was noted to be listless and febrile on 4/11.  In the emergency department, he was found to be in atrial fibrillation with RVR, and concern for severe sepsis with lactic acid 4.1.  He was started on empiric vancomycin, Zosyn and admitted to stepdown unit.  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 Patient did have bedside  debridement back in October 2018   Atrial fibrillation with RVR  - CHADS-VASc is at least 48 (age, CHF, DM, CAD). Xarelto on hold since admission - Cardizem gtt, rate improved this morning   Hypomagnesemia - Replace, trend   Chronic microcytic anemia - Baseline Hgb around 8 - Positive FOBT on admission  - Hold Xarelto, monitor for gross bleeding, monitor CBC   Chronic diastolic CHF  - Given IVF due to sepsis and low BP - Monitor volume status   Insulin-dependent DM  - Ha1c 6.1  - Levemir, SSI   CAD - Continue lipitor , hold ASA initially while following CBC     Acute encephalopathy  - Resolved, seems to be back to his baseline   DVT prophylaxis: SCD Code Status: DNR Family Communication: Sister updated over the phone Disposition Plan: Pending improvement, surgical consultation   Consultants:   General surgery   Procedures:   None   Antimicrobials:  Anti-infectives (From admission, onward)   Start     Dose/Rate Route Frequency Ordered Stop   10/20/17 2200  vancomycin (VANCOCIN) IVPB 1000 mg/200 mL premix     1,000 mg 200 mL/hr over 60 Minutes Intravenous Every 12 hours 10/20/17 2043     10/20/17 2200  meropenem (MERREM) 1 g in sodium chloride 0.9 % 100 mL IVPB     1 g 200 mL/hr over 30 Minutes Intravenous Every 8 hours 10/20/17 2043     10/20/17 2000  vancomycin (VANCOCIN) IVPB 1000 mg/200 mL premix  Status:  Discontinued     1,000 mg 200 mL/hr over 60 Minutes Intravenous  Once 10/20/17 1956  10/20/17 2051   10/20/17 1645  piperacillin-tazobactam (ZOSYN) IVPB 3.375 g     3.375 g 100 mL/hr over 30 Minutes Intravenous  Once 10/20/17 1636 10/20/17 1830   10/20/17 1645  vancomycin (VANCOCIN) IVPB 1000 mg/200 mL premix     1,000 mg 200 mL/hr over 60 Minutes Intravenous  Once 10/20/17 1636 10/20/17 1924       Subjective: Patient complains of pain in his lower back due to sacral ulcer.  He denies any chest pain, shortness of breath, nausea, vomiting,  diarrhea.  Objective: Vitals:   10/21/17 0930 10/21/17 1000 10/21/17 1030 10/21/17 1200  BP:  (!) 107/55 104/69 (!) 92/37  Pulse: 96 97 95 87  Resp: 10 16 14    Temp: 99 F (37.2 C) 99 F (37.2 C) 99 F (37.2 C) 99.1 F (37.3 C)  TempSrc:    Bladder  SpO2: 95% 98% 95% 99%  Weight:      Height:        Intake/Output Summary (Last 24 hours) at 10/21/2017 1240 Last data filed at 10/21/2017 1204 Gross per 24 hour  Intake 4909.59 ml  Output 250 ml  Net 4659.59 ml   Filed Weights   10/20/17 1538 10/21/17 0111  Weight: 121.6 kg (268 lb) 114 kg (251 lb 5.2 oz)    Examination:  General exam: Appears calm and comfortable Respiratory system: Clear to auscultation. Respiratory effort normal. Cardiovascular system: S1 & S2 heard, irregular rhythm, rate 90s. No JVD, murmurs, rubs, gallops or clicks Gastrointestinal system: Abdomen is nondistended, soft and nontender. No organomegaly or masses felt. Normal bowel sounds heard. Central nervous system: Alert and oriented. No focal neurological deficits. Extremities: Right AKA Psychiatry: Judgement and insight appear normal  Data Reviewed: I have personally reviewed following labs and imaging studies  CBC: Recent Labs  Lab 10/20/17 1630 10/21/17 0122  WBC 11.7* 10.8*  NEUTROABS 9.5* 9.6*  HGB 8.5* 7.4*  HCT 28.3* 23.9*  MCV 78.2 76.8*  PLT 317 262   Basic Metabolic Panel: Recent Labs  Lab 10/20/17 1630 10/21/17 0122  NA 135 135  K 3.9 3.6  CL 94* 100*  CO2 28 23  GLUCOSE 191* 222*  BUN 13 12  CREATININE 1.17 1.00  CALCIUM 7.9* 7.3*  MG  --  1.6*   GFR: Estimated Creatinine Clearance: 94.8 mL/min (by C-G formula based on SCr of 1 mg/dL). Liver Function Tests: Recent Labs  Lab 10/20/17 1630  AST 22  ALT 9*  ALKPHOS 132*  BILITOT 0.6  PROT 6.3*  ALBUMIN 1.7*   No results for input(s): LIPASE, AMYLASE in the last 168 hours. No results for input(s): AMMONIA in the last 168 hours. Coagulation Profile: Recent  Labs  Lab 10/20/17 1630  INR 2.54   Cardiac Enzymes: No results for input(s): CKTOTAL, CKMB, CKMBINDEX, TROPONINI in the last 168 hours. BNP (last 3 results) No results for input(s): PROBNP in the last 8760 hours. HbA1C: No results for input(s): HGBA1C in the last 72 hours. CBG: Recent Labs  Lab 10/21/17 0148 10/21/17 0500 10/21/17 0518 10/21/17 0726 10/21/17 1136  GLUCAP 210* 228* 196* 231* 221*   Lipid Profile: No results for input(s): CHOL, HDL, LDLCALC, TRIG, CHOLHDL, LDLDIRECT in the last 72 hours. Thyroid Function Tests: No results for input(s): TSH, T4TOTAL, FREET4, T3FREE, THYROIDAB in the last 72 hours. Anemia Panel: No results for input(s): VITAMINB12, FOLATE, FERRITIN, TIBC, IRON, RETICCTPCT in the last 72 hours. Sepsis Labs: Recent Labs  Lab 10/20/17 1630 10/20/17 2054 10/21/17 0122  PROCALCITON  --  0.15  --   LATICACIDVEN 4.10* 2.2* 0.9    Recent Results (from the past 240 hour(s))  Culture, blood (Routine x 2)     Status: None (Preliminary result)   Collection Time: 10/20/17  4:30 PM  Result Value Ref Range Status   Specimen Description   Final    BLOOD LEFT ANTECUBITAL Performed at [facility], [address] [place], [place] [address]    Special Requests   Final    BOTTLES DRAWN AEROBIC AND ANAEROBIC Blood Culture adequate volume Performed at [facility], [address] [place], [place] [address]    Culture   Final    NO GROWTH < 24 HOURS Performed at [facility], [address], [place], [place] [address]    Report Status PENDING  Incomplete  MRSA PCR Screening     Status: Abnormal   Collection Time: 10/21/17  1:12 AM  Result Value Ref Range Status   MRSA by PCR POSITIVE (A) NEGATIVE Final    Comment:        The GeneXpert MRSA Assay (FDA approved for NASAL specimens only), is one component of a comprehensive MRSA colonization surveillance program. It is not intended to diagnose  MRSA infection nor to guide or monitor treatment for MRSA infections. RESULT CALLED TO, READ BACK BY AND VERIFIED WITH: Ranell Patrick @0525  10/21/17 MKELLY Performed at [facility], [address], [place], [place] [address]        Radiology Studies: Dg Chest Port 1 View  Result Date: 10/20/2017 CLINICAL DATA:  Sepsis. EXAM: PORTABLE CHEST 1 VIEW COMPARISON:  10/02/2017 FINDINGS: The heart is mildly enlarged but stable. Stable tortuosity and calcification of the thoracic aorta. Mild central vascular congestion without overt pulmonary edema. No infiltrates or effusions. The bony thorax is intact. IMPRESSION: Cardiac enlargement and mild central vascular congestion without overt pulmonary edema. No definite infiltrates or effusions. Electronically Signed   By: Rudie Meyer M.D.   On: 10/20/2017 18:14      Scheduled Meds: . atorvastatin  80 mg Oral q1800  . calcium-vitamin D  1 tablet Oral Daily  . feeding supplement (GLUCERNA SHAKE)  237 mL Oral TID BM  . feeding supplement (PRO-STAT SUGAR FREE 64)  30 mL Oral TID WC  . gabapentin  600 mg Oral BID  . hydrocortisone sod succinate (SOLU-CORTEF) inj  50 mg Intravenous Q6H  . insulin aspart  0-9 Units Subcutaneous Q4H  . insulin detemir  5 Units Subcutaneous BID  . multivitamin with minerals  1 tablet Oral Daily  . pantoprazole  40 mg Oral Daily  . sodium chloride flush  3 mL Intravenous Q12H  . sodium hypochlorite   Irrigation Q1200   Continuous Infusions: . sodium chloride    . albumin human    . diltiazem (CARDIZEM) infusion Stopped (10/20/17 2048)  . meropenem (MERREM) IV 1 g (10/21/17 0606)  . vancomycin Stopped (10/21/17 1035)     LOS: 1 day    Time spent: 45 minutes   Noralee Stain, DO [name] [email or web address] Password [facility] 10/21/2017, 12:40 PM

## 2017-10-21 NOTE — Progress Notes (Signed)
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   - recommend hydrotherapy and we will reassess on Monday to see if surgery is indicated   LOS: 1 day    Subjective: CC: buttock pain  Pt states he had a sacral wound in the past that was healed not long ago. He states that he is nonambulatory at the SNF and has not been out of bed since he left the hospital on 04/05. He has not used a wheelchair and he states he does not get turned much at the SNF. He did not complain of pain until we turned him to assess the wound.   Objective: Vital signs in last 24 hours: Temp:  [98.8 F (37.1 C)-102.6 F (39.2 C)] 99.1 F (37.3 C) (04/12 1300) Pulse Rate:  [72-146] 90 (04/12 1300) Resp:  [7-34] 19 (04/12 1300) BP: (76-137)/(33-85) 108/55 (04/12 1300) SpO2:  [90 %-100 %] 99 % (04/12 1300) Weight:  [114 kg (251 lb 5.2 oz)-121.6 kg (268 lb)] 114 kg (251 lb 5.2 oz) (04/12 0111) Last BM Date: 10/21/10  Intake/Output from previous day: 04/11 0701 - 04/12 0700 In: 4452.9 [I.V.:1102.9; IV Piggyback:3350] Out: 250 [Urine:250] Intake/Output this shift: Total I/O In: 456.7 [I.V.:6.7; IV Piggyback:450] Out: -   PE: Gen:  Alert, NAD Card:   Irregularly irregular, rate normal, no murmurs appreciated Pulm:  Course breath sounds throughout, no W/R/R, rate and effort normal Abd: Soft, NT/ND, +BS, Extremities: 3+ pitting edema of hands and LLE GU: large sacral wound, black eschar of b/l ischium, no purulent drainage noted, no areas of fluctuance appreciated, posterior scrotum is erythematous but skin appears to be intact Skin: warm and dry   Anti-infectives: Anti-infectives (From admission, onward)   Start     Dose/Rate Route Frequency Ordered Stop   10/20/17 2200  vancomycin (VANCOCIN) IVPB 1000 mg/200 mL premix     1,000 mg 200 mL/hr over 60 Minutes Intravenous Every 12 hours 10/20/17 2043     10/20/17 2200  meropenem (MERREM) 1 g in sodium chloride 0.9 % 100 mL IVPB     1 g 200 mL/hr over 30 Minutes Intravenous Every 8 hours 10/20/17 2043     10/20/17 2000  vancomycin (VANCOCIN) IVPB 1000 mg/200 mL premix  Status:  Discontinued     1,000 mg 200 mL/hr over 60 Minutes Intravenous  Once 10/20/17 1956 10/20/17 2051   10/20/17 1645  piperacillin-tazobactam (ZOSYN) IVPB 3.375 g     3.375 g 100 mL/hr over 30 Minutes Intravenous  Once 10/20/17 1636 10/20/17 1830   10/20/17 1645  vancomycin (VANCOCIN) IVPB 1000 mg/200 mL premix     1,000 mg 200 mL/hr over 60 Minutes Intravenous  Once  10/20/17 1636 10/20/17 1924      Lab Results:  Recent Labs    10/20/17 1630 10/21/17 0122  WBC 11.7* 10.8*  HGB 8.5* 7.4*  HCT 28.3* 23.9*  PLT 317 262   BMET Recent Labs    10/20/17 1630 10/21/17 0122  NA 135 135  K 3.9 3.6  CL 94* 100*  CO2 28 23  GLUCOSE 191* 222*  BUN 13 12  CREATININE 1.17 1.00  CALCIUM 7.9* 7.3*   PT/INR Recent Labs    10/20/17 1630  LABPROT 27.2*  INR 2.54   CMP     Component Value Date/Time   NA 135 10/21/2017 0122   NA 136 (A) 03/22/2017   K 3.6 10/21/2017 0122   CL 100 (L) 10/21/2017 0122   CO2 23 10/21/2017 0122   GLUCOSE 222 (H) 10/21/2017 0122   BUN 12 10/21/2017 0122   BUN 34 (A)  03/22/2017   CREATININE 1.00 10/21/2017 0122   CREATININE 1.57 (H) 08/06/2016 1438   CALCIUM 7.3 (L) 10/21/2017 0122   PROT 6.3 (L) 10/20/2017 1630   ALBUMIN 1.7 (L) 10/20/2017 1630   AST 22 10/20/2017 1630   ALT 9 (L) 10/20/2017 1630   ALKPHOS 132 (H) 10/20/2017 1630   BILITOT 0.6 10/20/2017 1630   GFRNONAA >60 10/21/2017 0122   GFRNONAA 46 (L) 08/06/2016 1438   GFRAA >60 10/21/2017 0122   GFRAA 53 (L) 08/06/2016 1438   Lipase  No results found for: LIPASE  Studies/Results: Dg Chest Port 1 View  Result Date: 10/20/2017 CLINICAL DATA:  Sepsis. EXAM: PORTABLE CHEST 1 VIEW COMPARISON:  10/02/2017 FINDINGS: The heart is mildly enlarged but stable. Stable tortuosity and calcification of the thoracic aorta. Mild central vascular congestion without overt pulmonary edema. No infiltrates or effusions. The bony thorax is intact. IMPRESSION: Cardiac enlargement and mild central vascular congestion without overt pulmonary edema. No definite infiltrates or effusions. Electronically Signed   By: Rudie Meyer M.D.   On: 10/20/2017 18:14      Jerre Simon , [facility] 10/21/2017, 1:52 PM  Pager: [phone] Mon-Wed, Friday 7:00am-4:30pm Thurs 7am-11:30am  Consults: [phone]

## 2017-10-21 NOTE — Progress Notes (Signed)
Initial Nutrition Assessment  DOCUMENTATION CODES:   Obesity unspecified  INTERVENTION:  - Diet advancement as medically feasible. - RD will provide appropriate interventions at follow-up dependent on diet order at that time.   NUTRITION DIAGNOSIS:   Increased nutrient needs related to acute illness, wound healing as evidenced by estimated needs.  GOAL:   Patient will meet greater than or equal to 90% of their needs  MONITOR:   Diet advancement, Weight trends, Labs, Skin, I & O's  REASON FOR ASSESSMENT:   Malnutrition Screening Tool, Consult Assessment of nutrition requirement/status  ASSESSMENT:   67 y.o. male with medical history significant for afib, CAD, insulin-dependent DM, diastolic CHF, chronic low back pain, and microcytic anemia. He presented to the ED from SNF for evaluation of fevers and decreased responsiveness. Patient was discharged from the hospital 1 week ago after management of septic shock and VDRF, suspected secondary to aspiration PNA and complicated by C. difficile infection. He had reportedly been recovering well at his SNF before he was noted to be listless and febrile on day of admission. Patient had not been expressing any specific complaints leading up to this and there has not been any cough or vomiting noted. No diarrhea reported. Nursing reported seeing some blood and having suspicion for possible GI bleed.  Pt has been NPO since admission. He is noted to be a/o to self and place. Pt is known to this RD from previous admission (3/21-4/5) during which time he was intubated and following extubation appetite slowly advanced. By the 2-3 days prior to d/c, pt was consuming 50-100% of meals and intermittently drinking one Premier Protein shake per day. Appetite was mainly good during the week he was at SNF, no chewing or swallowing difficulties or abdominal pain or nausea with intakes while he was there. He is currently ordered Glucerna Shake TID (220 kcal, 10  grams of protein per bottle) and 30 mL Prostat TID (100 kcal, 15 grams of protein per packet). Will monitor intakes with diet advancement and if adjustments need to be made at that time.   Per Dr. Francesco Runner note yesterday evening: Sepsis which is suspected to be 2/2 skin/soft-tissue infection (suspected to be d/t decubitus ulcer), afib with RVR, microcytic anemia with occult blood in the stool, FOBT in ED was positive, holding Lasix at this time, HbgA1c was 6.1% earlier this month, acute encephalopathy which was improving even in the ED.   Physical assessment outlined below. Per chart review, pt has lost 17 lbs (7.6% body weight) in the past 1 year. Pt was receiving Lasix during previous admission. Will need to continue to monitor weight trends.    Medications reviewed; 12.5 g albumin x3 doses today, 25 g albumin x1 dose yesterday, 1 tablet Oscal-D/day, 50 mg Solu-cortef QID, sliding scale Novolog, 5 units Levemir BID, 2 g IV Mg sulfate x1 run today, daily multivitamin with minerals, 1 packet Phos-nak TID.   Labs reviewed; CBGs: 210, 228, 196, and 231 mg/dL this AM, Cl: 373 mmol/L, Ca: 7.3 mg/dL, Mg: 1.6 mg/dL.     NUTRITION - FOCUSED PHYSICAL EXAM:  Completed/assessed; no muscle and no fat wasting noted; mild to severe edema to all extremities.   Diet Order:  Diet NPO time specified Except for: Ice Chips, Sips with Meds  EDUCATION NEEDS:   No education needs have been identified at this time  Skin:  Skin Assessment: Skin Integrity Issues: Skin Integrity Issues:: Unstageable Unstageable: full thickness to sacrum Incisions: R leg (2/21); bilateral scrotum (3/28)  Last BM:  4/11  Height:   Ht Readings from Last 1 Encounters:  10/21/17 6' (1.829 m)    Weight:   Wt Readings from Last 1 Encounters:  10/21/17 251 lb 5.2 oz (114 kg)    Ideal Body Weight:  80.91 kg  BMI:  Body mass index is 34.09 kg/m.  Estimated Nutritional Needs:   Kcal:  2050-2280 (18-20 kcal/kg)  Protein:   135-148 grams (1.2-1.3 grams/kg)  Fluid:  >/= 2.2 L/day       Paul Gammon, MS, RD, LDN, [facility] Inpatient Clinical Dietitian Pager # [phone] After hours/weekend pager # [phone]

## 2017-10-21 NOTE — Clinical Social Work Note (Addendum)
Clinical Social Work Assessment  Patient Details  Name: [name] MRN: [id number] Date of Birth: 28-Mar-1951  Date of referral:  10/21/17               Reason for consult:  Facility Placement , Discharge Needs.               Permission sought to share information with:    Permission granted to share information::  Yes, Verbal Permission Granted  Name::     [name]  Agency::  SNF  Relationship::  Sister  Contact Information:  [phone]  Housing/Transportation Living arrangements for the past 2 months:  Skilled Nursing Facility Source of Information:  Siblings, Medical Team Patient Interpreter Needed:  None Criminal Activity/Legal Involvement Pertinent to Current Situation/Hospitalization:  No - Comment as needed Significant Relationships:  Siblings Lives with:  Facility Resident Do you feel safe going back to the place where you live?  Yes Need for family participation in patient care:  Yes (Dependent w/mobility)   Care giving concerns:   No payor source for SNF/Patient unable to return per facility admission coordinator.  Patient has been a Long term care resident at [facility]. CSW called facility to provide an update and the facility Admission Coordinator Paul Braun reports the patient was given a discharge notice last week and will not be able to return to the SNF.   Social Worker assessment / plan:   CSW called patient sister to inquire about the situation. She reports she was not informed of the patient discharge and the patient did not inform her. She reports the patient [name] has paid for his stay and his disability and does not understand why/how the payment have stopped.   -She reports the patient receives several letters everyday but has not opened any of the mail. She feels they may have sent information in regards to his disability and payments. She reports uncertainty of how to navigate and reinstate patient payor source. CSW  informed patient sister to discuss this her concerns with SNF social worker and Department of Social Services that can help assist with the medicaid application. She reports understanding and will follow up Monday morning.   Barriers at this time: Patient does not have long term payor source and cannot return to [name].   Plan: CSW will continue to follow for placement needs.   Employment status:  Disabled (Comment on whether or not currently receiving Disability) Insurance information:  Managed Medicare PT Recommendations:  Skilled Nursing Facility Information / Referral to community resources:  Skilled Nursing Facility  Patient/Family's Response to care: Responding to care.   Patient/Family's Understanding of and Emotional Response to Diagnosis, Current Treatment, and Prognosis:  Patient oriented to person and place. Patient sister understands patient medical situation and has been updated by physician.   Emotional Assessment Appearance:  Appears stated age Attitude/Demeanor/Rapport:    Affect (typically observed):    Orientation:  Oriented to Self, Oriented to Place, Oriented to Situation Alcohol / Substance use:  Not Applicable Psych involvement (Current and /or in the community):  No (Comment)  Discharge Needs  Concerns to be addressed:  Discharge Planning Concerns Readmission within the last 30 days:  No Current discharge risk:  Physical Impairment Barriers to Discharge:  Continued Medical Work up, No SNF bed   Paul Coots, LCSW 10/21/2017, 3:43 PM

## 2017-10-21 NOTE — Progress Notes (Signed)
Pt admitted to 1233 from ED.  C/o pain from large unstageable sacral decubitus.  Dr. Antionette Char notified.  Decub assessed, cleansed and redressed with wet to dry dressing.   Foley temp catheter in place on admission.  Pt does not remember when it was placed.  Peri care/foley care completed and documented.

## 2017-10-21 NOTE — ED Notes (Signed)
Report called to nurse however, due to patient condition, another nurse will take the patient instead. ICU nurse who will receive the patient will call back to get report.

## 2017-10-21 NOTE — Progress Notes (Signed)
Pt continues to need pain meds.  They are not holding for two hours.  Dr. Antionette Char notif8ied.  Orders received.  Pts pain is in his buttock from wound.

## 2017-10-21 NOTE — Progress Notes (Signed)
CSW consult- Life-threatening illness, unable to reach family  CSW confirmed physician was able to contact patient sister.   Vivi Barrack, Theresia Majors, MSW Clinical Social Worker  [phone] 10/21/2017  4:15 PM

## 2017-10-21 NOTE — Consult Note (Signed)
WOC Nurse wound consult note Reason for Consult:Stage 4 pressure injury present on admission. Patient relays intense pain from this site and refuses hydrotherapy due to pain.  Will recommend surgical consult to assess for appropriateness of surgical debridement.  Moisture associated skin damage to scrotum and buttocks. Present on admission Wound type:pressure and moisture Pressure Injury POA: Yes Measurement: 10 cm x 12 cm wound bed is obscured by devitalized tissue. Unable to measure depth Scrotum with partial thickness skin loss.  Friable and bleeds with cleansing present on admission.  Wound URK:YHCWCBJS slough to wound bed.  Pink and moist scrotum Drainage (amount, consistency, odor) moderate serosanguinous  Periwound:blanchable erythema Dressing procedure/placement/frequency:Cleanse sacral wound with NS and pat dry.  Fill dead space with Dakin's moist kerlix.  Cover with ABD pads and tape.  Change daily.  Recommend surgical consult. Refuses hydrotherapy Will not follow at this time.  Please re-consult if needed.  Maple Hudson RN BSN CWON Pager [phone]

## 2017-10-21 NOTE — ED Notes (Signed)
Report given to receiving nurse

## 2017-10-22 DIAGNOSIS — Z7189 Other specified counseling: Secondary | ICD-10-CM

## 2017-10-22 DIAGNOSIS — Z515 Encounter for palliative care: Secondary | ICD-10-CM

## 2017-10-22 DIAGNOSIS — G8929 Other chronic pain: Secondary | ICD-10-CM

## 2017-10-22 DIAGNOSIS — M545 Low back pain: Secondary | ICD-10-CM

## 2017-10-22 LAB — CBC
HCT: 23.3 % — ABNORMAL LOW (ref 39.0–52.0)
Hemoglobin: 7.1 g/dL — ABNORMAL LOW (ref 13.0–17.0)
MCH: 23.7 pg — ABNORMAL LOW (ref 26.0–34.0)
MCHC: 30.5 g/dL (ref 30.0–36.0)
MCV: 77.9 fL — ABNORMAL LOW (ref 78.0–100.0)
Platelets: 302 10*3/uL (ref 150–400)
RBC: 2.99 MIL/uL — ABNORMAL LOW (ref 4.22–5.81)
RDW: 20.1 % — AB (ref 11.5–15.5)
WBC: 7.1 10*3/uL (ref 4.0–10.5)

## 2017-10-22 LAB — GLUCOSE, CAPILLARY
GLUCOSE-CAPILLARY: 177 mg/dL — AB (ref 65–99)
GLUCOSE-CAPILLARY: 175 mg/dL — AB (ref 65–99)
GLUCOSE-CAPILLARY: 194 mg/dL — AB (ref 65–99)
Glucose-Capillary: 255 mg/dL — ABNORMAL HIGH (ref 65–99)
Glucose-Capillary: 256 mg/dL — ABNORMAL HIGH (ref 65–99)
Glucose-Capillary: 261 mg/dL — ABNORMAL HIGH (ref 65–99)

## 2017-10-22 LAB — BASIC METABOLIC PANEL
Anion gap: 9 (ref 5–15)
BUN: 12 mg/dL (ref 6–20)
CALCIUM: 7.6 mg/dL — AB (ref 8.9–10.3)
CO2: 26 mmol/L (ref 22–32)
CREATININE: 0.73 mg/dL (ref 0.61–1.24)
Chloride: 102 mmol/L (ref 101–111)
GFR calc non Af Amer: >60 mL/min (ref >60)
Glucose, Bld: 286 mg/dL — ABNORMAL HIGH (ref 65–99)
Potassium: 2.8 mmol/L — ABNORMAL LOW (ref 3.5–5.1)
SODIUM: 137 mmol/L (ref 135–145)

## 2017-10-22 LAB — MAGNESIUM: MAGNESIUM: 2.1 mg/dL (ref 1.7–2.4)

## 2017-10-22 MED ORDER — VANCOMYCIN HCL 10 G IV SOLR
1250.0000 mg | Freq: Two times a day (BID) | INTRAVENOUS | Status: DC
  Administered 2017-10-22 – 2017-10-24 (×5): 1250 mg via INTRAVENOUS
  Filled 2017-10-22 (×5): qty 1250

## 2017-10-22 MED ORDER — HYDROMORPHONE HCL 2 MG/ML IJ SOLN: 2.0000 mg | Freq: Once | INTRAMUSCULAR | Status: DC | PRN

## 2017-10-22 MED ORDER — HYDROMORPHONE HCL 1 MG/ML IJ SOLN
2.0000 mg | Freq: Once | INTRAMUSCULAR | Status: AC | PRN
  Administered 2017-10-22: 2 mg via INTRAVENOUS
  Filled 2017-10-22: qty 2

## 2017-10-22 MED ORDER — DILTIAZEM HCL ER COATED BEADS 120 MG PO CP24
120.0000 mg | ORAL_CAPSULE | Freq: Every day | ORAL | Status: DC
  Administered 2017-10-22: 120 mg via ORAL
  Filled 2017-10-22: qty 1

## 2017-10-22 MED ORDER — INSULIN ASPART 100 UNIT/ML ~~LOC~~ SOLN
0.0000 [IU] | Freq: Three times a day (TID) | SUBCUTANEOUS | Status: DC
  Administered 2017-10-22 – 2017-10-23 (×3): 2 [IU] via SUBCUTANEOUS
  Administered 2017-10-23: 3 [IU] via SUBCUTANEOUS
  Administered 2017-10-23: 5 [IU] via SUBCUTANEOUS
  Administered 2017-10-24: 7 [IU] via SUBCUTANEOUS
  Administered 2017-10-24: 5 [IU] via SUBCUTANEOUS
  Administered 2017-10-24: 2 [IU] via SUBCUTANEOUS
  Administered 2017-10-25 – 2017-10-28 (×3): 1 [IU] via SUBCUTANEOUS
  Administered 2017-10-29 – 2017-10-30 (×3): 2 [IU] via SUBCUTANEOUS
  Administered 2017-10-30: 1 [IU] via SUBCUTANEOUS
  Administered 2017-10-31 (×2): 3 [IU] via SUBCUTANEOUS
  Administered 2017-11-01: 1 [IU] via SUBCUTANEOUS
  Administered 2017-11-01: 2 [IU] via SUBCUTANEOUS

## 2017-10-22 MED ORDER — POTASSIUM CHLORIDE CRYS ER 20 MEQ PO TBCR
40.0000 meq | EXTENDED_RELEASE_TABLET | ORAL | Status: AC
  Administered 2017-10-22 (×2): 40 meq via ORAL
  Filled 2017-10-22 (×2): qty 2

## 2017-10-22 MED ORDER — HYDROCORTISONE NA SUCCINATE PF 100 MG IJ SOLR
50.0000 mg | Freq: Three times a day (TID) | INTRAMUSCULAR | Status: DC
  Administered 2017-10-22 – 2017-10-23 (×3): 50 mg via INTRAVENOUS
  Filled 2017-10-22 (×3): qty 2

## 2017-10-22 NOTE — Progress Notes (Signed)
   10/22/17 1500 Hydrotherapy evaluation  [id number]  Subjective Assessment  Subjective oh, I need more medicine  Patient and Family Stated Goals none stated  Prior Treatments dressing changes  Evaluation and Treatment  Evaluation and Treatment Procedures Explained to Patient/Family Yes  Evaluation and Treatment Procedures agreed to  Wound / Incision (Open or Dehisced) 10/22/17 Other (Comment) Sacrum Medial large wound contaminated  with BM  Date First Assessed/Time First Assessed: 10/22/17 1444   Wound Type: Other (Comment)  Location: Sacrum  Location Orientation: Medial  Wound Description (Comments): large wound contaminated  with BM  Present on Admission: Yes  Dressing Type  (Dakins)  Dressing Changed New  Dressing Status Clean;Dry  Dressing Change Frequency PRN  Site / Wound Assessment Friable;Black;Bleeding  % Wound base Red or Granulating 5%  % Wound base Yellow/Fibrinous Exudate 50%  % Wound base Black/Eschar 45%  Peri-wound Assessment Bleeding;Excoriated;Maceration  Wound Length (cm) 12 cm  Wound Width (cm) 15 cm  Wound Depth (cm) 2 cm  Wound Volume (cm^3) 360 cm^3  Wound Surface Area (cm^2) 180 cm^2  Margins Unattached edges (unapproximated)  Drainage Amount Moderate  Drainage Description Green;Purulent  Non-staged Wound Description Full thickness  Treatment Hydrotherapy (Pulse lavage) (Dakin's)  Hydrotherapy  Pulsed lavage therapy - wound location sacrum  Pulsed Lavage with Suction (psi) 8 psi  Pulsed Lavage with Suction - Normal Saline Used 1000 mL  Pulsed Lavage Tip Tip with splash shield  Wound Therapy - Assess/Plan/Recommendations  Wound Therapy - Clinical Statement the patient has large  painful decubitus that has  BM contaminant , slough in gluteal fold, loose eschar across the  wound.  The patient requires extensive assistance for positioning. Noted R AKA. The patient has resided in SNF for several months and previously received Hydrotherapy for a sacral wound.   The patient is limited in mobility . Recommend air mattress for the patient.  The  patient will benefit from PLS. Surgery is planning to reevaluate the wound on  4/15 for need of I and D.   Wound Therapy - Functional Problem List total assistance for mobility.  Factors Delaying/Impairing Wound Healing Incontinence;Multiple medical problems;Immobility  Hydrotherapy Plan Debridement;Dressing change;Patient/family education;Pulsatile lavage with suction  Wound Therapy - Frequency 6X / week  Wound Therapy - Follow Up Recommendations Skilled nursing facility  Wound Plan PLS and dressing changes  Wound Therapy Goals - Improve the function of patient's integumentary system by progressing the wound(s) through the phases of wound healing by:  Decrease Necrotic Tissue to 50  Decrease Necrotic Tissue - Progress Goal set today  Increase Granulation Tissue to 50  Increase Granulation Tissue - Progress Goal set today  Goals/treatment plan/discharge plan were made with and agreed upon by patient/family No, Patient unable to participate in goals/treatment/discharge plan and family unavailable  Time For Goal Achievement 2 weeks  Wound Therapy - Potential for Goals Poor  Blanchard Kelch PT [phone]

## 2017-10-22 NOTE — Progress Notes (Signed)
Pharmacy Antibiotic Note  [name] is a 67 y.o. male admitted on 10/20/2017 with sepsis.  Pharmacy has been consulted for vancomycin and meropenem dosing.  Patient with suspected skin/soft tissue infection. At end of March, had + c. Difficile,  UCx = VRE and acinetobacter (treated with amp/sulb).  Clostridium spp found in BCx last admission Noted to have ESBL E. coli bacteremia in Feb.   Plan:  Due to improvement in SCr, change vanc from 1000mg  q12 to 1250mg  IV q12 - goal AUC 400-500  Continue Meropenem 1gm IV q8h  Monitor renal fx  Await culture results  Consider ID consultation if does not improve within next 24h based on h/o MDRO  Height: 6' (182.9 cm) Weight: 251 lb 5.2 oz (114 kg) IBW/kg (Calculated) : 77.6  Temp (24hrs), Avg:98.4 F (36.9 C), Min:97.7 F (36.5 C), Max:99.1 F (37.3 C)  Recent Labs  Lab 10/20/17 1630 10/20/17 2054 10/21/17 0122 10/22/17 0350  WBC 11.7*  --  10.8* 7.1  CREATININE 1.17  --  1.00 0.73  LATICACIDVEN 4.10* 2.2* 0.9  --     Estimated Creatinine Clearance: 118.5 mL/min (by C-G formula based on SCr of 0.73 mg/dL).    No Known Allergies  Antimicrobials this admission: 4/11 vancomycin >> 4/11 meropenem >>  Dose adjustments this admission:  Microbiology results: 3/21 UCx: 70K VRE (amp susc), acinetobacter (S to amp/sulb, cefepime, gent) 3/21 BCx: 1/2 GVR = clostridium spp.  3/22 C. Diff quick scan: +/-  3/22 C. Diff PCR: Positive  4/11 BCx: ngtd 4/11 UCx:  Sent 4/11 MRSA PCR: positive   Thank you for allowing pharmacy to be a part of this patient's care.  6/11, PharmD, BCPS Pager [phone] 10/22/2017 9:55 AM

## 2017-10-22 NOTE — Progress Notes (Signed)
PROGRESS NOTE    [name]  ZOX:[id number] DOB: 05-26-1951 DOA: 10/20/2017 PCP: [name], MD (Inactive)     Brief Narrative:  [name] is a 68 y.o. male with medical history significant for atrial fibrillation on Xarelto, coronary artery disease, insulin-dependent diabetes mellitus, chronic diastolic CHF, chronic low back pain, and microcytic anemia, now presenting to the emergency department from his SNF for evaluation of fevers and decreased responsiveness.  Patient was discharged from the hospital 1 week ago after management of septic shock and ventilator dependent respiratory failure, suspected secondary to aspiration pneumonia and complicated by C. difficile infection.  He had reportedly been recovering well at his SNF before he was noted to be listless and febrile on 4/11.  In the emergency department, he was found to be in atrial fibrillation with RVR, and concern for severe sepsis with lactic acid 4.1.  He was started on empiric vancomycin, Zosyn and admitted to stepdown unit.   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 and ESBL bacteremia   - Blood culture pending - Solucortef for suspected adrenal insufficiency in setting of hypotension, wean  - IVF  - Continue empiric abx with vancomycin and meropenem  - Wound RN recommendation noted, consulted general surgery for potential debridement  - PT consulted for hydrotherapy     Atrial fibrillation with RVR  - CHADS-VASc is at least 3 (age, CHF, DM, CAD). Xarelto on hold since admission - Rate controlled and off cardizem gtt this morning   Chronic microcytic anemia - Baseline Hgb around 8 - Positive FOBT on admission  - Hold Xarelto - BM without gross bleeding  - Monitor CBC, if stays stable, will start heparin gtt   Chronic diastolic CHF  - Given IVF due to sepsis and low BP - Monitor volume status   Insulin-dependent DM  - Ha1c 6.1  - Levemir, SSI   CAD - Continue lipitor , hold ASA initially while following CBC     Acute encephalopathy  - Resolved, seems to be back to his baseline   DVT prophylaxis: SCD Code Status: DNR Family Communication: No family at bedside Sister updated over the phone 4/12  Disposition Plan: Pending clinical improvement in wound, sepsis    Consultants:   General surgery   Procedures:   None   Antimicrobials:  Anti-infectives (From admission, onward)   Start     Dose/Rate Route Frequency Ordered Stop   10/20/17 2200  vancomycin (VANCOCIN) IVPB 1000 mg/200 mL premix     1,000 mg 200 mL/hr over 60 Minutes Intravenous Every 12 hours 10/20/17 2043     10/20/17 2200  meropenem (MERREM) 1 g in sodium chloride 0.9 % 100 mL IVPB     1 g 200 mL/hr over 30 Minutes Intravenous Every 8 hours 10/20/17 2043     10/20/17 2000  vancomycin (VANCOCIN) IVPB 1000 mg/200 mL premix  Status:  Discontinued     1,000  mg 200 mL/hr over 60 Minutes Intravenous  Once 10/20/17 1956 10/20/17 2051   10/20/17 1645  piperacillin-tazobactam (ZOSYN) IVPB 3.375 g     3.375 g 100 mL/hr over 30 Minutes Intravenous  Once 10/20/17 1636 10/20/17 1830   10/20/17 1645  vancomycin (VANCOCIN) IVPB 1000 mg/200 mL premix     1,000 mg 200 mL/hr over 60 Minutes Intravenous  Once 10/20/17 1636 10/20/17 1924       Subjective: Continues to complain of pain in his sacrum. He was switched from fentanyl q1h to dilaudid 1mg  q2h. He is asking  for increase in pain medication. No other physical complaints.   Objective: Vitals:   10/22/17 0300 10/22/17 0400 10/22/17 0500 10/22/17 0600  BP: (!) 111/49 (!) 100/35 (!) 92/42 109/74  Pulse: 86 91 88 93  Resp: (!) 25 (!) 26 (!) 23 (!) 28  Temp: 97.9 F (36.6 C) 97.9 F (36.6 C) 97.9 F (36.6 C) 97.9 F (36.6 C)  TempSrc:      SpO2: 96% 98% 97% 97%  Weight:      Height:        Intake/Output Summary (Last 24 hours) at 10/22/2017 0849 Last data filed at 10/22/2017 0400 Gross per 24 hour  Intake 1420.5 ml  Output 1550 ml  Net -129.5 ml   Filed Weights   10/20/17 1538 10/21/17 0111  Weight: 121.6 kg (268 lb) 114 kg (251 lb 5.2 oz)    Examination: General exam: Appears calm and comfortable  Respiratory system: Clear to auscultation. Respiratory effort normal. Cardiovascular system: S1 & S2 heard, irreg rhythm rate 80s. No JVD, murmurs, rubs, gallops or clicks. No pedal edema. Gastrointestinal system: Abdomen is nondistended, soft and nontender. No organomegaly or masses felt. Normal bowel sounds heard. Central nervous system: Alert and oriented. No focal neurological deficits. Extremities: +Right AKA  Skin: Not examined this morning, will await PT hydrotherapy this afternoon to evaluate with them at dressing change  Psychiatry: Judgement and insight appear normal. Mood & affect appropriate.    Data Reviewed: I have personally reviewed following labs and imaging studies  CBC: Recent Labs  Lab 10/20/17 1630 10/21/17 0122 10/22/17 0350  WBC 11.7* 10.8* 7.1  NEUTROABS 9.5* 9.6*  --   HGB 8.5* 7.4* 7.1*  HCT 28.3* 23.9* 23.3*  MCV 78.2 76.8* 77.9*  PLT 317 262 302   Basic Metabolic Panel: Recent Labs  Lab 10/20/17 1630 10/21/17 0122 10/22/17 0350  NA 135 135 137  K 3.9 3.6 2.8*  CL 94* 100* 102  CO2 28 23 26   GLUCOSE 191* 222* 286*  BUN 13 12 12   CREATININE 1.17 1.00 0.73  CALCIUM 7.9* 7.3* 7.6*  MG  --  1.6* 2.1   GFR: Estimated Creatinine Clearance:  118.5 mL/min (by C-G formula based on SCr of 0.73 mg/dL). Liver Function Tests: Recent Labs  Lab 10/20/17 1630  AST 22  ALT 9*  ALKPHOS 132*  BILITOT 0.6  PROT 6.3*  ALBUMIN 1.7*   No results for input(s): LIPASE, AMYLASE in the last 168 hours. No results for input(s): AMMONIA in the last 168 hours. Coagulation Profile: Recent Labs  Lab 10/20/17 1630  INR 2.54   Cardiac Enzymes: No results for input(s): CKTOTAL, CKMB, CKMBINDEX, TROPONINI in the last 168 hours. BNP (last 3 results) No results for input(s): PROBNP in the last 8760 hours. HbA1C: No results for input(s): HGBA1C in the last 72 hours. CBG: Recent Labs  Lab 10/21/17 1502 10/21/17 1953 10/22/17 0036 10/22/17 0336 10/22/17  0732  GLUCAP 252* 271* 255* 261* 256*   Lipid Profile: No results for input(s): CHOL, HDL, LDLCALC, TRIG, CHOLHDL, LDLDIRECT in the last 72 hours. Thyroid Function Tests: No results for input(s): TSH, T4TOTAL, FREET4, T3FREE, THYROIDAB in the last 72 hours. Anemia Panel: No results for input(s): VITAMINB12, FOLATE, FERRITIN, TIBC, IRON, RETICCTPCT in the last 72 hours. Sepsis Labs: Recent Labs  Lab 10/20/17 1630 10/20/17 2054 10/21/17 0122  PROCALCITON  --  0.15  --   LATICACIDVEN 4.10* 2.2* 0.9    Recent Results (from the past 240 hour(s))  Culture, blood (Routine x 2)     Status: None (Preliminary result)   Collection Time: 10/20/17  4:30 PM  Result Value Ref Range Status   Specimen Description   Final    BLOOD LEFT ANTECUBITAL Performed at [facility], [address], [place], [place] [address]    Special Requests   Final    BOTTLES DRAWN AEROBIC AND ANAEROBIC Blood Culture adequate volume Performed at [facility], [address] [place], [place] [address]    Culture   Final    NO GROWTH < 24 HOURS Performed at [facility], [address] [place], [place] [address]    Report Status PENDING  Incomplete  MRSA PCR  Screening     Status: Abnormal   Collection Time: 10/21/17  1:12 AM  Result Value Ref Range Status   MRSA by PCR POSITIVE (A) NEGATIVE Final    Comment:        The GeneXpert MRSA Assay (FDA approved for NASAL specimens only), is one component of a comprehensive MRSA colonization surveillance program. It is not intended to diagnose MRSA infection nor to guide or monitor treatment for MRSA infections. RESULT CALLED TO, READ BACK BY AND VERIFIED WITH: Ranell Patrick @0525  10/21/17 MKELLY Performed at [facility], [address] [place], [place]        Radiology Studies: Dg Chest Port 1 View  Result Date: 10/20/2017 CLINICAL DATA:  Sepsis. EXAM: PORTABLE CHEST 1 VIEW COMPARISON:  10/02/2017 FINDINGS: The heart is mildly enlarged but stable. Stable tortuosity and calcification of the thoracic aorta. Mild central vascular congestion without overt pulmonary edema. No infiltrates or effusions. The bony thorax is intact. IMPRESSION: Cardiac enlargement and mild central vascular congestion without overt pulmonary edema. No definite infiltrates or effusions. Electronically Signed   By: 10/04/2017 M.D.   On: 10/20/2017 18:14      Scheduled Meds: . atorvastatin  80 mg Oral q1800  . calcium-vitamin D  1 tablet Oral Daily  . feeding supplement (GLUCERNA SHAKE)  237 mL Oral TID BM  . feeding supplement (PRO-STAT SUGAR FREE 64)  30 mL Oral TID WC  . gabapentin  600 mg Oral BID  . hydrocortisone sod succinate (SOLU-CORTEF) inj  50 mg Intravenous Q8H  . insulin aspart  0-9 Units Subcutaneous Q4H  . insulin detemir  5 Units Subcutaneous BID  . multivitamin with minerals  1 tablet Oral Daily  . pantoprazole  40 mg Oral Daily  . potassium chloride  40 mEq Oral Q4H  . sodium chloride flush  3 mL Intravenous Q12H  . sodium hypochlorite   Irrigation Q1200   Continuous Infusions: . sodium chloride 1,000 mL (10/22/17 0814)  . meropenem (MERREM) IV Stopped (10/22/17  0541)  . vancomycin Stopped (10/21/17 2237)     LOS: 2 days    Time spent: 20 minutes   2238, DO  [name] [email or web address] Password [facility] 10/22/2017, 8:49 AM

## 2017-10-22 NOTE — Plan of Care (Signed)
  Problem: Pain Managment: Goal: General experience of comfort will improve Outcome: Progressing   

## 2017-10-23 LAB — GLUCOSE, CAPILLARY
GLUCOSE-CAPILLARY: 253 mg/dL — AB (ref 65–99)
GLUCOSE-CAPILLARY: 205 mg/dL — AB (ref 65–99)
GLUCOSE-CAPILLARY: 211 mg/dL — AB (ref 65–99)
Glucose-Capillary: 174 mg/dL — ABNORMAL HIGH (ref 65–99)

## 2017-10-23 LAB — BASIC METABOLIC PANEL
Anion gap: 10 (ref 5–15)
BUN: 11 mg/dL (ref 6–20)
CHLORIDE: 105 mmol/L (ref 101–111)
CO2: 24 mmol/L (ref 22–32)
Calcium: 7.5 mg/dL — ABNORMAL LOW (ref 8.9–10.3)
Creatinine, Ser: 0.74 mg/dL (ref 0.61–1.24)
GFR calc non Af Amer: >60 mL/min (ref >60)
Glucose, Bld: 265 mg/dL — ABNORMAL HIGH (ref 65–99)
POTASSIUM: 3.5 mmol/L (ref 3.5–5.1)
SODIUM: 139 mmol/L (ref 135–145)

## 2017-10-23 LAB — CBC
HEMATOCRIT: 26.1 % — AB (ref 39.0–52.0)
HEMOGLOBIN: 7.8 g/dL — AB (ref 13.0–17.0)
MCH: 23.4 pg — AB (ref 26.0–34.0)
MCHC: 29.9 g/dL — AB (ref 30.0–36.0)
MCV: 78.4 fL (ref 78.0–100.0)
Platelets: 421 10*3/uL — ABNORMAL HIGH (ref 150–400)
RBC: 3.33 MIL/uL — AB (ref 4.22–5.81)
RDW: 20 % — ABNORMAL HIGH (ref 11.5–15.5)
WBC: 14.4 10*3/uL — ABNORMAL HIGH (ref 4.0–10.5)

## 2017-10-23 MED ORDER — DIPHENHYDRAMINE HCL 50 MG/ML IJ SOLN: 12.5000 mg | Freq: Four times a day (QID) | INTRAMUSCULAR | Status: DC | PRN

## 2017-10-23 MED ORDER — CARVEDILOL 3.125 MG PO TABS: 3.1250 mg | ORAL_TABLET | Freq: Two times a day (BID) | ORAL | Status: DC

## 2017-10-23 MED ORDER — DIPHENHYDRAMINE HCL 12.5 MG/5ML PO ELIX: 12.5000 mg | ORAL_SOLUTION | Freq: Four times a day (QID) | ORAL | Status: DC | PRN

## 2017-10-23 MED ORDER — SODIUM CHLORIDE 0.9% FLUSH: 9.0000 mL | INTRAVENOUS | Status: DC | PRN

## 2017-10-23 MED ORDER — HYDROCORTISONE NA SUCCINATE PF 100 MG IJ SOLR
50.0000 mg | Freq: Two times a day (BID) | INTRAMUSCULAR | Status: DC
  Administered 2017-10-23 – 2017-10-24 (×2): 50 mg via INTRAVENOUS
  Filled 2017-10-23 (×2): qty 2

## 2017-10-23 MED ORDER — NALOXONE HCL 0.4 MG/ML IJ SOLN: 0.4000 mg | INTRAMUSCULAR | Status: DC | PRN

## 2017-10-23 MED ORDER — HYDROMORPHONE 1 MG/ML IV SOLN
INTRAVENOUS | Status: DC
  Administered 2017-10-23: 17:00:00 via INTRAVENOUS
  Administered 2017-10-23: 2.55 mg via INTRAVENOUS
  Administered 2017-10-24: 3.65 mg via INTRAVENOUS
  Administered 2017-10-24: 0.944 mg via INTRAVENOUS
  Administered 2017-10-24: 2.51 mg via INTRAVENOUS
  Administered 2017-10-24: 2.4 mg via INTRAVENOUS
  Administered 2017-10-24: 1.5 mg via INTRAVENOUS
  Administered 2017-10-24: 2.55 mg via INTRAVENOUS
  Administered 2017-10-25: 4.14 mg via INTRAVENOUS
  Administered 2017-10-25: 1.56 mg via INTRAVENOUS
  Administered 2017-10-25: 04:00:00 via INTRAVENOUS
  Administered 2017-10-25: 4.1 mg via INTRAVENOUS
  Filled 2017-10-23 (×2): qty 25

## 2017-10-23 NOTE — Plan of Care (Signed)
  Problem: Pain Managment: Goal: General experience of comfort will improve Outcome: Progressing   Problem: Safety: Goal: Ability to remain free from injury will improve Outcome: Progressing   

## 2017-10-23 NOTE — Progress Notes (Addendum)
At 2146 CTM reported pt had a pause of 2.83.  Returns to afib 72.  Pt was asymptomatic and VSS.   CTM reports that this is not his first pause over 2 seconds.  Monitored closely throughout shift.  No more pauses reported the rest of the shift.    Pt continues to refuse to be turned or repositioned during the shift.  Much education provided and verbalizes understanding but continues to refuse.  Asking and receiving 1 mg of Dilaudid q2 throughout shift.  Is noted to be sleeping well between doses.

## 2017-10-23 NOTE — Progress Notes (Signed)
PROGRESS NOTE    [name]  WGN:[id number] DOB: September 15, 1950 DOA: 10/20/2017 PCP: [name], MD (Inactive)     Brief Narrative:  [name] is a 67 y.o. male with medical history significant for atrial fibrillation on Xarelto, coronary artery disease, insulin-dependent diabetes mellitus, chronic diastolic CHF, chronic low back pain, and microcytic anemia, now presenting to the emergency department from his SNF for evaluation of fevers and decreased responsiveness.  Patient was discharged from the hospital 1 week ago after management of septic shock and ventilator dependent respiratory failure, suspected secondary to aspiration pneumonia and complicated by C. difficile infection.  He had reportedly been recovering well at his SNF before he was noted to be listless and febrile on 4/11.  In the emergency department, he was found to be in atrial fibrillation with RVR, and concern for severe sepsis with lactic acid 4.1.  He was started on empiric vancomycin, merrem and admitted to stepdown unit.   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 and ESBL bacteremia   - Blood culture negative to date - Urine culture with > 100,000 acinetobacter calcoaceticus/baumannii, awaiting sensitivity  - Solucortef for suspected adrenal insufficiency in setting of hypotension, wean  - Continue empiric abx with vancomycin and meropenem  - Wound RN recommendation  noted, consulted general surgery for potential debridement  - PT consulted for hydrotherapy  - Palliative care following for pain management, patient currently requesting dilaudid every 2 hours and requesting to increase dose/frequency  - Air mattress ordered   Atrial fibrillation with RVR  - CHADS-VASc is at least 4 (age, CHF, DM, CAD). Xarelto on hold since admission - Rate controlled and off cardizem gtt. Will hold oral cardizem due to pause on tele overnight   Chronic microcytic anemia - Baseline Hgb around 8 - Positive FOBT on admission  - Hold Xarelto - BM without gross bleeding  - Monitor CBC, has been stable and no further report of gross GI bleeding  Chronic diastolic CHF  - Given IVF due to sepsis and low BP - Monitor volume status  - Stable   Insulin-dependent DM  - Ha1c 6.1  - Levemir, SSI   CAD - Continue lipitor, hold ASA initially while following CBC    Acute encephalopathy  - Resolved, seems to be back to his baseline   DVT prophylaxis: SCD Code Status: DNR Family Communication: No family at bedside  Disposition Plan: Pending clinical improvement in wound, sepsis. Will need SNF on discharge    Consultants:   General surgery   Procedures:   None   Antimicrobials:  Anti-infectives (From admission, onward)   Start     Dose/Rate Route Frequency Ordered Stop   10/22/17 1000  vancomycin (VANCOCIN) 1,250 mg in sodium chloride 0.9 % 250 mL IVPB     1,250 mg 166.7 mL/hr over 90 Minutes Intravenous Every 12 hours 10/22/17 0958     10/20/17 2200  vancomycin (VANCOCIN)  IVPB 1000 mg/200 mL premix  Status:  Discontinued     1,000 mg 200 mL/hr over 60 Minutes Intravenous Every 12 hours 10/20/17 2043 10/22/17 0958   10/20/17 2200  meropenem (MERREM) 1 g in sodium chloride 0.9 % 100 mL IVPB     1 g 200 mL/hr over 30 Minutes Intravenous Every 8 hours 10/20/17 2043     10/20/17 2000  vancomycin (VANCOCIN) IVPB 1000 mg/200 mL premix  Status:  Discontinued      1,000 mg 200 mL/hr over 60 Minutes Intravenous  Once 10/20/17 1956 10/20/17 2051   10/20/17 1645  piperacillin-tazobactam (ZOSYN) IVPB 3.375 g     3.375 g 100 mL/hr over 30 Minutes Intravenous  Once 10/20/17 1636 10/20/17 1830   10/20/17 1645  vancomycin (VANCOCIN) IVPB 1000 mg/200 mL premix     1,000 mg 200 mL/hr over 60 Minutes Intravenous  Once 10/20/17 1636 10/20/17 1924       Subjective: No new events.  Patient was resting comfortably prior to my arrival in room.  When he was turned over to examine his wound, he complained of significant pain.  Objective: Vitals:   10/22/17 1000 10/22/17 1113 10/22/17 2158 10/23/17 0408  BP: (!) 94/56 125/62 107/63 127/70  Pulse: 76 91 74 98  Resp: (!) 22 18 20 17   Temp: (!) 97.5 F (36.4 C) 98 F (36.7 C) 97.7 F (36.5 C) 97.7 F (36.5 C)  TempSrc:  Oral Oral Oral  SpO2: 94% 100% 96% 93%  Weight:      Height:        Intake/Output Summary (Last 24 hours) at 10/23/2017 1257 Last data filed at 10/23/2017 10/25/2017 Gross per 24 hour  Intake 3703.33 ml  Output 781 ml  Net 2922.33 ml   Filed Weights   10/20/17 1538 10/21/17 0111  Weight: 121.6 kg (268 lb) 114 kg (251 lb 5.2 oz)    Examination: General exam: Appears calm and comfortable  Respiratory system: Clear to auscultation. Respiratory effort normal. Cardiovascular system: S1 & S2 heard, RRR. No JVD, murmurs, rubs, gallops or clicks. No pedal edema. Gastrointestinal system: Abdomen is nondistended, soft and nontender. No organomegaly or masses felt. Normal bowel sounds heard. Central nervous system: Alert and oriented Extremities: Right AKA  Psychiatry: Judgement and insight appear normal. Mood & affect appropriate.  Skin:     Data Reviewed: I have personally reviewed following labs and imaging studies  CBC: Recent Labs  Lab 10/20/17 1630 10/21/17 0122 10/22/17 0350 10/23/17 0443  WBC 11.7* 10.8* 7.1 14.4*  NEUTROABS 9.5* 9.6*  --   --   HGB 8.5* 7.4* 7.1* 7.8*    HCT 28.3* 23.9* 23.3* 26.1*  MCV 78.2 76.8* 77.9* 78.4  PLT 317 262 302 421*   Basic Metabolic Panel: Recent Labs  Lab 10/20/17 1630 10/21/17 0122 10/22/17 0350 10/23/17 0443  NA 135 135 137 139  K 3.9 3.6 2.8* 3.5  CL 94* 100* 102 105  CO2 28 23 26 24   GLUCOSE 191* 222* 286* 265*  BUN 13 12 12 11   CREATININE 1.17 1.00 0.73 0.74  CALCIUM 7.9* 7.3* 7.6* 7.5*  MG  --  1.6* 2.1  --    GFR: Estimated Creatinine Clearance: 118.5 mL/min (by C-G formula based on SCr of 0.74 mg/dL). Liver Function Tests: Recent Labs  Lab 10/20/17 1630  AST 22  ALT 9*  ALKPHOS 132*  BILITOT 0.6  PROT 6.3*  ALBUMIN 1.7*   No results for input(s): LIPASE, AMYLASE in the last  168 hours. No results for input(s): AMMONIA in the last 168 hours. Coagulation Profile: Recent Labs  Lab 10/20/17 1630  INR 2.54   Cardiac Enzymes: No results for input(s): CKTOTAL, CKMB, CKMBINDEX, TROPONINI in the last 168 hours. BNP (last 3 results) No results for input(s): PROBNP in the last 8760 hours. HbA1C: No results for input(s): HGBA1C in the last 72 hours. CBG: Recent Labs  Lab 10/22/17 1147 10/22/17 1648 10/22/17 2153 10/23/17 0739 10/23/17 1151  GLUCAP 177* 175* 194* 253* 174*   Lipid Profile: No results for input(s): CHOL, HDL, LDLCALC, TRIG, CHOLHDL, LDLDIRECT in the last 72 hours. Thyroid Function Tests: No results for input(s): TSH, T4TOTAL, FREET4, T3FREE, THYROIDAB in the last 72 hours. Anemia Panel: No results for input(s): VITAMINB12, FOLATE, FERRITIN, TIBC, IRON, RETICCTPCT in the last 72 hours. Sepsis Labs: Recent Labs  Lab 10/20/17 1630 10/20/17 2054 10/21/17 0122  PROCALCITON  --  0.15  --   LATICACIDVEN 4.10* 2.2* 0.9    Recent Results (from the past 240 hour(s))  Culture, blood (Routine x 2)     Status: None (Preliminary result)   Collection Time: 10/20/17  4:30 PM  Result Value Ref Range Status   Specimen Description   Final    BLOOD LEFT ANTECUBITAL Performed at  [facility], [address], [place], [place] [address]    Special Requests   Final    BOTTLES DRAWN AEROBIC AND ANAEROBIC Blood Culture adequate volume Performed at [facility], [address], [place], [place] [address]    Culture   Final    NO GROWTH 3 DAYS Performed at [facility], [address], [place], [place] [address]    Report Status PENDING  Incomplete  Urine culture     Status: Abnormal (Preliminary result)   Collection Time: 10/20/17  4:58 PM  Result Value Ref Range Status   Specimen Description   Final    URINE, RANDOM Performed at [facility], [address] [place], [place] [address]    Special Requests   Final    NONE Performed at [facility], [address], [place], [place] [address]    Culture (A)  Final    >=100,000 COLONIES/mL ACINETOBACTER CALCOACETICUS/BAUMANNII COMPLEX REPEATING SENSITIVITIES Performed at [facility], [address] [place], [place] [address]    Report Status PENDING  Incomplete  MRSA PCR Screening     Status: Abnormal   Collection Time: 10/21/17  1:12 AM  Result Value Ref Range Status   MRSA by PCR POSITIVE (A) NEGATIVE Final    Comment:        The GeneXpert MRSA Assay (FDA approved for NASAL specimens only), is one component of a comprehensive MRSA colonization surveillance program. It is not intended to diagnose MRSA infection nor to guide or monitor treatment for MRSA infections. RESULT CALLED TO, READ BACK BY AND VERIFIED WITH: Ranell Patrick @0525  10/21/17 MKELLY Performed at [facility], [address], [place], [place] [address]        Radiology Studies: No results found.    Scheduled Meds: . atorvastatin  80 mg Oral q1800  . calcium-vitamin D  1 tablet Oral Daily  . feeding supplement (GLUCERNA SHAKE)  237 mL Oral TID BM  . feeding supplement (PRO-STAT SUGAR FREE 64)  30 mL Oral TID WC  .  gabapentin  600 mg Oral BID  . hydrocortisone sod succinate (SOLU-CORTEF) inj  50 mg Intravenous Q8H  .  insulin aspart  0-9 Units Subcutaneous TID WC  . insulin detemir  5 Units Subcutaneous BID  . multivitamin with minerals  1 tablet Oral Daily  . pantoprazole  40 mg Oral Daily  . sodium chloride flush  3 mL Intravenous Q12H   Continuous Infusions: . meropenem (MERREM) IV Stopped (10/23/17 9166)  . vancomycin 1,250 mg (10/23/17 1210)     LOS: 3 days    Time spent: 20 minutes   Noralee Stain, DO [name] [email or web address] Password [facility] 10/23/2017, 12:57 PM

## 2017-10-23 NOTE — Consult Note (Signed)
Consultation Note Date: 10/23/2017   Patient Name: [name]  DOB: 19-Apr-1951  MRN: [id number]  Age / Sex: 67 y.o., male  PCP: [name], MD (Inactive) Referring Physician: Noralee Stain, DO  Reason for Consultation: Establishing goals of care  HPI/Patient Profile: 67 y.o. male  with past medical history of hypertension, hyperlipidemia, diabetes mellitus, atrial fibrillation on Xarelto, OSA not on CPAP, GI bleeding, dCHF, COPD, CKD3, s/p of right AKA admitted on 10/20/2017 with sepsis, a fib, and concern for GI bleeding.  This is his 3rd admission in 6 months and palliative was involved in care during recent admission with discharge on 4/5.  Palliative consulted for goals of care.  Clinical Assessment and Goals of Care: [name] appears remains ill and continues to be unable to really participate in conversation. He will awaken and give 1-2 word answers, but he really will not participate in conversation and I do not think that he has understanding of what is going on at this point.  Primary Decision Maker NEXT OF KIN sister [name]   SUMMARY OF RECOMMENDATIONS   - DNR - Continue aggressive pain management - Will follow-up again tomorrow to see if he is more able to participate in conversation and also will continue to reach out to his sister.  During prior admissions, it has been difficult to reach her at times.  Code Status/Advance Care Planning:  DNR Symptom Management:   Pain: Has been getting pain medication around the clock.  Currently ordered IV dilaudid 1mg  Q2 hours as needed and 2mg  prior to hydrotherapy.  His total oral morphine equivalent for the last 24 hours is 180mg .  He was also getting fentanyl hourly yesterday.  Continue same regimen today and will see if this remains consistent.  If so, will likely start fentanyl patch tomorrow based on usage overnight.    Palliative Prophylaxis:   Aspiration, Bowel Regimen, Delirium Protocol, Frequent Pain Assessment, Oral Care and Turn Reposition  Additional Recommendations (Limitations, Scope, Preferences):  Full Scope Treatment outside DNR - cont conversations, but patient not really able to participate and unable to reach sister today  Psycho-social/Spiritual:   Desire for further Chaplaincy support:yes  Additional Recommendations: Education on Hospice and Grief/Bereavement Support  Prognosis:  Guarded  Discharge Planning: To Be Determined      Primary Diagnoses: Present on Admission: . Sepsis due to skin infection (HCC) . Chronic diastolic CHF (congestive heart failure) (HCC) . Acute encephalopathy . ANEMIA-IRON DEFICIENCY . Atrial fibrillation with rapid ventricular response (HCC) . 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 Pressure ulcer . Protein-calorie malnutrition, severe (HCC) . Sepsis (HCC) . Occult blood in stools   I have reviewed the medical record, interviewed the patient and family, and examined the patient. The following aspects are pertinent.  Past Medical History:  Diagnosis Date  . Arthritis    "hands and lower back" (09/19/2014)  . CAD (coronary artery disease)    a. s/p PCI to RCA in 2012. b. prior cath in 01/2014  with elevated L/RH pressures, mild-mod CAD of LAD/LCx with patent RCA, normal EF, c/b CIN/CHF.  [name] Carpal tunnel syndrome, bilateral   . Cellulitis   . Chronic diastolic CHF (congestive heart failure) (HCC)   . Chronic kidney disease (CKD), stage III (moderate) (HCC)   . Chronic lower back pain   . Diabetes (HCC)   . DKA (diabetic ketoacidoses) (HCC) 03/2017  . History of blood transfusion ~ 1954   "related to OR"  . Hyperlipidemia   . Hypertension   . Hypoxia    a. Qualified for home O2 at DC in 09/2014.  [name] Lower GI bleed   . Microcytic anemia   . Morbid obesity (HCC)   . Neuropathy   . OSA (obstructive sleep apnea)    "I wear  nasal prongs; haven't been using prongs recently" (09/19/2014)  . PAF (paroxysmal atrial fibrillation) (HCC)    TEE DCCV 09/23/2014  . Physical deconditioning   . Pilonidal cyst 1980's; 01/25/2013  . Scrotal abscess   . Type II diabetes mellitus (HCC)    Social History   Socioeconomic History  . Marital status: Single    Spouse name: Not on file  . Number of children: Y  . Years of education: Not on file  . Highest education level: Not on file  Occupational History  . Occupation: disabled truck Public librarian Needs  . Financial resource strain: Not on file  . Food insecurity:    Worry: Not on file    Inability: Not on file  . Transportation needs:    Medical: Not on file    Non-medical: Not on file  Tobacco Use  . Smoking status: Former Smoker    Packs/day: 1.00    Years: 20.00    Pack years: 20.00    Types: Cigarettes    Last attempt to quit: 07/13/1983    Years since quitting: 34.3  . Smokeless tobacco: Never Used  Substance and Sexual Activity  . Alcohol use: No  . Drug use: No  . Sexual activity: Not Currently  Lifestyle  . Physical activity:    Days per week: Not on file    Minutes per session: Not on file  . Stress: Not on file  Relationships  . Social connections:    Talks on phone: Not on file    Gets together: Not on file    Attends religious service: Not on file    Active member of club or organization: Not on file    Attends meetings of clubs or organizations: Not on file    Relationship status: Not on file  Other Topics Concern  . Not on file  Social History Narrative   Pt is single.   Lives with sister.   Has children.   Was adopted.   Daily cafffiene-2 cups of coffee and 2 sodas per day      Family History  Adopted: Yes  Problem Relation Age of Onset  . Other Other        PT ADOPTED   Scheduled Meds: . atorvastatin  80 mg Oral q1800  . calcium-vitamin D  1 tablet Oral Daily  . feeding supplement (GLUCERNA SHAKE)  237 mL Oral TID BM  .  feeding supplement (PRO-STAT SUGAR FREE 64)  30 mL Oral TID WC  . gabapentin  600 mg Oral BID  . hydrocortisone sod succinate (SOLU-CORTEF) inj  50 mg Intravenous Q8H  . insulin aspart  0-9 Units Subcutaneous TID WC  . insulin detemir  5  Units Subcutaneous BID  . multivitamin with minerals  1 tablet Oral Daily  . pantoprazole  40 mg Oral Daily  . sodium chloride flush  3 mL Intravenous Q12H  . sodium hypochlorite   Irrigation Q1200   Continuous Infusions: . meropenem (MERREM) IV Stopped (10/23/17 7741)  . vancomycin Stopped (10/23/17 0153)   PRN Meds:.acetaminophen **OR** acetaminophen, albuterol, diphenhydrAMINE, HYDROmorphone (DILAUDID) injection, ondansetron **OR** ondansetron (ZOFRAN) IV, polyvinyl alcohol, white petrolatum No Known Allergies Review of Systems  Unable to perform ROS: Acuity of condition    Physical Exam  Constitutional: He has a sickly appearance.  Obese, pale in appearance  Cardiovascular: Normal rate. An irregularly irregular rhythm present.  Pulmonary/Chest: No accessory muscle usage. No tachypnea. No respiratory distress. He has decreased breath sounds.  Shallow breathes  Abdominal: Soft. Normal appearance.  Neurological: He is disoriented.  Answers some questions appropriately  Nursing note and vitals reviewed.   Vital Signs: BP 127/70 (BP Location: Right Arm)   Pulse 98   Temp 97.7 F (36.5 C) (Oral)   Resp 17   Ht 6' (1.829 m)   Wt 114 kg (251 lb 5.2 oz)   SpO2 93%   BMI 34.09 kg/m  Pain Scale: 0-10   Pain Score: Asleep   SpO2: SpO2: 93 % O2 Device:SpO2: 93 % O2 Flow Rate: .O2 Flow Rate (L/min): 2.5 L/min  IO: Intake/output summary:   Intake/Output Summary (Last 24 hours) at 10/23/2017 1100 Last data filed at 10/23/2017 2878 Gross per 24 hour  Intake 3703.33 ml  Output 781 ml  Net 2922.33 ml    LBM: Last BM Date: 10/22/17 Baseline Weight: Weight: 121.6 kg (268 lb) Most recent weight: Weight: 114 kg (251 lb 5.2 oz)       Palliative Assessment/Data: 20%   Flowsheet Rows     Most Recent Value  Intake Tab  Referral Department  Hospitalist  Unit at Time of Referral  Med/Surg Unit  Palliative Care Primary Diagnosis  Sepsis/Infectious Disease  Date Notified  10/21/17  Palliative Care Type  Return patient Palliative Care  Reason for referral  Clarify Goals of Care, Pain  Date of Admission  10/20/17  Date first seen by Palliative Care  10/22/17  # of days Palliative referral response time  1 Day(s)  # of days IP prior to Palliative referral  1  Clinical Assessment  Palliative Performance Scale Score  50%  Pain Max last 24 hours  9  Pain Min Last 24 hours  3  Psychosocial & Spiritual Assessment  Palliative Care Outcomes      Time Total: 60 min  Greater than 50%  of this time was spent counseling and coordinating care related to the above assessment and plan.  Signed by: Romie Minus, MD [facility] Palliative Medicine Team [phone]

## 2017-10-23 NOTE — Progress Notes (Signed)
Daily Progress Note   Patient Name: [name]       Date: 10/23/2017 DOB: 03-03-1951  Age: 67 y.o. MRN#: [id number] Attending Physician: Noralee Stain, DO Primary Care Physician: [name], MD (Inactive) Admit Date: 10/20/2017  Reason for Consultation/Follow-up: Establishing goals of care and Pain control  Subjective: Reports "miseable" and he cannot get comfortable.  States that his only concern today is working on getting his pain to manageable level.  See below.  Length of Stay: 3  Current Medications: Scheduled Meds:  . atorvastatin  80 mg Oral q1800  . calcium-vitamin D  1 tablet Oral Daily  . feeding supplement (GLUCERNA SHAKE)  237 mL Oral TID BM  . feeding supplement (PRO-STAT SUGAR FREE 64)  30 mL Oral TID WC  . gabapentin  600 mg Oral BID  . hydrocortisone sod succinate (SOLU-CORTEF) inj  50 mg Intravenous Q12H  . HYDROmorphone   Intravenous Q4H  . insulin aspart  0-9 Units Subcutaneous TID WC  . insulin detemir  5 Units Subcutaneous BID  . multivitamin with minerals  1 tablet Oral Daily  . pantoprazole  40 mg Oral Daily  . sodium chloride flush  3 mL Intravenous Q12H    Continuous Infusions: . meropenem (MERREM) IV 1 g (10/23/17 2102)  . vancomycin Stopped (10/23/17 1340)    PRN Meds: acetaminophen **OR** acetaminophen, albuterol, diphenhydrAMINE **OR** diphenhydrAMINE, HYDROmorphone (DILAUDID) injection, naloxone **AND** sodium chloride flush, ondansetron **OR** ondansetron (ZOFRAN) IV, polyvinyl alcohol, white petrolatum  Physical Exam        .  Obese, pale in appearance  Cardiovascular: Normal rate. An irregularly irregular rhythm present.  Pulmonary/Chest: No accessory muscle usage. No tachypnea. No respiratory distress. He has decreased breath  sounds.  Shallow breaths  Abdominal: Soft. Normal appearance.   Vital Signs: BP 107/74 (BP Location: Right Arm)   Pulse 92   Temp 97.6 F (36.4 C) (Oral)   Resp 15   Ht 6' (1.829 m)   Wt 114 kg (251 lb 5.2 oz)   SpO2 94%   BMI 34.09 kg/m  SpO2: SpO2: 94 % O2 Device: O2 Device: Room Air O2 Flow Rate: O2 Flow Rate (L/min): 2.5 L/min  Intake/output summary:   Intake/Output Summary (Last 24 hours) at 10/23/2017 2137 Last data filed at 10/23/2017 1408 Gross per 24 hour  Intake 3603.33 ml  Output 781 ml  Net 2822.33 ml   LBM: Last BM Date: 10/23/17 Baseline Weight: Weight: 121.6 kg (268 lb) Most recent weight: Weight: 114 kg (251 lb 5.2 oz)       Palliative Assessment/Data:    Flowsheet Rows     Most Recent Value  Intake Tab  Referral Department  Hospitalist  Unit at Time of Referral  Med/Surg Unit  Palliative Care Primary Diagnosis  Sepsis/Infectious Disease  Date Notified  10/21/17  Palliative Care Type  Return patient Palliative Care  Reason for referral  Clarify Goals of Care, Pain  Date of Admission  10/20/17  Date first seen by Palliative Care  10/22/17  # of days Palliative referral response time  1 Day(s)  # of days IP prior to Palliative referral  1  Clinical Assessment  Palliative Performance Scale Score  50%  Pain Max last 24 hours  9  Pain Min Last 24 hours  3  Psychosocial & Spiritual Assessment  Palliative Care Outcomes      Patient Active Problem List   Diagnosis Date Noted  . Protein-calorie malnutrition, severe (HCC) 10/20/2017  . Sepsis (HCC) 10/20/2017  . Occult blood in stools 10/20/2017  . C. difficile diarrhea 10/12/2017  . Hypomagnesemia 10/12/2017  . Septic shock (HCC) 09/29/2017  . Acute encephalopathy   . Palliative care encounter   . UTI (urinary tract infection) 09/01/2017  . Acute metabolic encephalopathy 09/01/2017  . S/P AKA (above knee amputation) unilateral, right (HCC) 09/01/2017  . Atrial fibrillation with rapid  ventricular response (HCC)   . Hyponatremia 04/11/2017  . ARF (acute renal failure) (HCC) 04/11/2017  . Pressure injury of skin 03/24/2017  . Below knee amputation status, right (HCC) 03/23/2017  . DKA (diabetic ketoacidoses) (HCC) 03/14/2017  . Elevated troponin 03/14/2017  . Diabetic polyneuropathy associated with type 2 diabetes mellitus (HCC)   . Osteoarthritis of both knees   . Diabetic nephropathy associated with type 2 diabetes mellitus (HCC)   . Bilateral lower leg cellulitis 01/22/2017  . Venous stasis 08/06/2016  . At high risk for falls 07/21/2016  . CHF exacerbation (HCC) 06/10/2016  . Lactic acidosis 06/09/2016  . Syncope and collapse 03/05/2016  . Acute on chronic renal insufficiency 03/05/2016  . Obesity 12/30/2015  . Scrotal swelling   . Foot fracture   . 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 Lower GI bleed   . Pressure ulcer 12/24/2015  . Acute kidney injury (HCC) 12/23/2015  . Back pain 12/23/2015  . Fall   . Metatarsal fracture   . Traumatic rhabdomyolysis (HCC)   . Hyperkalemia   . Lisfranc dislocation   . Hyperglycemia   . Sepsis due to skin infection (HCC) 09/08/2015  . Scrotal wall abscess 09/08/2015  . AKI (acute kidney injury) (HCC) 09/08/2015  . Chest wall pain   . On amiodarone therapy 12/08/2014  . Respiratory failure with hypoxia (HCC) 12/04/2014  . Hypoxia 12/04/2014  . Acute on chronic respiratory failure with hypoxia (HCC)   . Atrial fibrillation with RVR (HCC) 09/20/2014  . Chronic diastolic CHF (congestive heart failure) (HCC) 09/19/2014  . Acute respiratory failure (HCC) 09/19/2014  . Abnormal nuclear cardiac imaging test 02/12/2014  . Dyspnea 02/12/2014  . Chronic anticoagulation- Coumadin 02/12/2014  . CKD (chronic kidney disease) stage 3, GFR 30-59 ml/min (HCC) 02/12/2014  . Chronic diastolic congestive heart failure (HCC) 02/11/2014  . Encounter for therapeutic drug monitoring 08/03/2013  . Pilonidal cyst  01/25/2013  .  Perirectal cellulitis 09/06/2012  . Long term (current) use of anticoagulants 11/18/2011  . Obstructive sleep apnea- unable to tolerate c-pap 08/04/2011  . CAD S/P percutaneous coronary angioplasty 08/04/2011  . ANEMIA-IRON DEFICIENCY 01/06/2009  . OTHER AND UNSPECIFIED COAGULATION DEFECTS 01/06/2009  . Anemia 01/02/2009  . CARDIOVASCULAR FUNCTION STUDY, ABNORMAL 01/02/2009  . Essential hypertension, benign 10/22/2008  . Atrial fibrillation 10/22/2008  . Hyperlipidemia 09/23/2008  . Hypokalemia 09/23/2008  . Obesity, Class III, BMI 40-49.9 (morbid obesity) (HCC) 09/23/2008  . CELLULITIS AND ABSCESS OF LEG EXCEPT FOOT 09/23/2008    Palliative Care Assessment & Plan   Patient Profile: 67 y.o. male  with past medical history of hypertension, hyperlipidemia, diabetes mellitus, atrial fibrillation onXarelto, OSA not on CPAP, GI bleeding,dCHF,COPD, CKD3,s/p of right AKA admitted on 10/20/2017 with sepsis, a fib, and concern for GI bleeding.  This is his 3rd admission in 6 months and palliative was involved in care during recent admission with discharge on 4/5.  Palliative consulted for goals of care and assistance with pain management.  Recommendations/Plan: Pain: Continues to report pain is not controlled.  He requests increase in medication today.  As he continues to report uncontrolled pain, will plan for initiation of PCA overnight in order to try to determine his overall needs.  While he certainly has clear source of pain with large sacral wound, stll consider that there may be some component of chemical coping.  I discussed risk vs benefit of opioids and use of PCA from pain control.  He reports pain control is most important thing to him and he is suffering.  He understands inherent risk and is agreeable to palliative PCA.  He has used 12mg  dilaudid in the last 24 hours.  Plan for dilaudid PCA with 0.3mg  basal and 0.3mg  bolus with 15 minute lockout. Goals of care: Reports "I  hurt too bad to consider any of that today."  Code Status:    Code Status Orders  (From admission, onward)        Start     Ordered   10/20/17 1953  Do not attempt resuscitation (DNR)  Continuous    Question Answer Comment  In the event of cardiac or respiratory ARREST Do not call a "code blue"   In the event of cardiac or respiratory ARREST Do not perform Intubation, CPR, defibrillation or ACLS   In the event of cardiac or respiratory ARREST Use medication by any route, position, wound care, and other measures to relive pain and suffering. May use oxygen, suction and manual treatment of airway obstruction as needed for comfort.      10/20/17 1956    Code Status History    Date Active Date Inactive Code Status Order ID Comments User Context   09/29/2017 0457 10/15/2017 0232 Full Code [id number]  Erin Fulling, MD ED   09/08/2017 1026 09/13/2017 2008 DNR [id number]  Alwyn Ren, MD Inpatient   09/01/2017 0535 09/08/2017 1026 Full Code [id number]  Lorretta Harp, MD ED   04/11/2017 0425 04/22/2017 0201 DNR [id number]  Pearson Grippe, MD ED   03/23/2017 1511 03/28/2017 2216 DNR [id number]  Nadara Mustard, MD Inpatient   03/14/2017 0831 03/18/2017 2212 Full Code [id number]  Ozella Rocks, MD ED   03/14/2017 0831 03/14/2017 0831 Full Code [id number]  Ozella Rocks, MD ED   01/22/2017 0857 01/25/2017 2139 Full Code [id number]  Jordan Hawks, DO Inpatient   06/10/2016 0946 06/18/2016 0030 Full Code [id number]  Therisa Doyne, MD Inpatient   12/23/2015  2049 12/30/2015 1821 DNR [id number]  Casey Burkitt, MD Inpatient   09/08/2015 2156 09/17/2015 2240 Full Code [id number]  Ron Parker, MD Inpatient   07/08/2015 2124 07/10/2015 1811 Full Code [id number]  Lorretta Harp, MD ED   12/04/2014 2303 12/11/2014 2124 Full Code [id number]  Pearson Grippe, MD Inpatient   09/19/2014 1806 09/24/2014 1845 Full Code [id number]  Corrie Dandy Inpatient   02/11/2014 1557 02/14/2014 1629 Full Code [id number]  Rosalio Macadamia, NP Inpatient   09/07/2012 0131 09/10/2012 1731 Full Code [id number]  Eduard Clos, MD Inpatient       Prognosis:   Unable to determine  Discharge Planning:  To Be Determined  Care plan was discussed with patient, RN, Dr. Alvino Chapel  Thank you for allowing the Palliative Medicine Team to assist in the care of this patient.   Total Time 40 Prolonged Time Billed No      Greater than 50%  of this time was spent counseling and coordinating care related to the above assessment and plan.  Romie Minus, MD  Please contact Palliative Medicine Team phone at [phone] for questions and concerns.

## 2017-10-24 ENCOUNTER — Inpatient Hospital Stay (HOSPITAL_COMMUNITY): Payer: Medicare Other

## 2017-10-24 DIAGNOSIS — M6281 Muscle weakness (generalized): Secondary | ICD-10-CM

## 2017-10-24 DIAGNOSIS — Z87891 Personal history of nicotine dependence: Secondary | ICD-10-CM

## 2017-10-24 DIAGNOSIS — L089 Local infection of the skin and subcutaneous tissue, unspecified: Secondary | ICD-10-CM

## 2017-10-24 DIAGNOSIS — Z960 Presence of urogenital implants: Secondary | ICD-10-CM

## 2017-10-24 DIAGNOSIS — B9689 Other specified bacterial agents as the cause of diseases classified elsewhere: Secondary | ICD-10-CM

## 2017-10-24 DIAGNOSIS — L89159 Pressure ulcer of sacral region, unspecified stage: Secondary | ICD-10-CM

## 2017-10-24 DIAGNOSIS — Z89611 Acquired absence of right leg above knee: Secondary | ICD-10-CM

## 2017-10-24 DIAGNOSIS — A419 Sepsis, unspecified organism: Secondary | ICD-10-CM

## 2017-10-24 LAB — CBC
HEMATOCRIT: 27.2 % — AB (ref 39.0–52.0)
HEMOGLOBIN: 8 g/dL — AB (ref 13.0–17.0)
MCH: 23.3 pg — ABNORMAL LOW (ref 26.0–34.0)
MCHC: 29.4 g/dL — AB (ref 30.0–36.0)
MCV: 79.1 fL (ref 78.0–100.0)
Platelets: 506 10*3/uL — ABNORMAL HIGH (ref 150–400)
RBC: 3.44 MIL/uL — ABNORMAL LOW (ref 4.22–5.81)
RDW: 20.3 % — ABNORMAL HIGH (ref 11.5–15.5)
WBC: 17.3 10*3/uL — ABNORMAL HIGH (ref 4.0–10.5)

## 2017-10-24 LAB — URINE CULTURE: Culture: >=100000 — AB

## 2017-10-24 LAB — BASIC METABOLIC PANEL
Anion gap: 9 (ref 5–15)
BUN: 11 mg/dL (ref 6–20)
CHLORIDE: 103 mmol/L (ref 101–111)
CO2: 25 mmol/L (ref 22–32)
CREATININE: 0.81 mg/dL (ref 0.61–1.24)
Calcium: 7.3 mg/dL — ABNORMAL LOW (ref 8.9–10.3)
GFR calc non Af Amer: >60 mL/min (ref >60)
Glucose, Bld: 326 mg/dL — ABNORMAL HIGH (ref 65–99)
Potassium: 3.4 mmol/L — ABNORMAL LOW (ref 3.5–5.1)
Sodium: 137 mmol/L (ref 135–145)

## 2017-10-24 LAB — GLUCOSE, CAPILLARY
GLUCOSE-CAPILLARY: 254 mg/dL — AB (ref 65–99)
Glucose-Capillary: 339 mg/dL — ABNORMAL HIGH (ref 65–99)
Glucose-Capillary: 159 mg/dL — ABNORMAL HIGH (ref 65–99)
Glucose-Capillary: 96 mg/dL (ref 65–99)

## 2017-10-24 MED ORDER — HYDROCORTISONE NA SUCCINATE PF 100 MG IJ SOLR
50.0000 mg | Freq: Every day | INTRAMUSCULAR | Status: DC
  Administered 2017-10-25: 50 mg via INTRAVENOUS
  Filled 2017-10-24: qty 2

## 2017-10-24 MED ORDER — SODIUM CHLORIDE 0.9 % IV SOLN
3.0000 g | Freq: Four times a day (QID) | INTRAVENOUS | Status: AC
  Administered 2017-10-24 – 2017-10-27 (×13): 3 g via INTRAVENOUS
  Filled 2017-10-24 (×13): qty 3

## 2017-10-24 MED ORDER — IOPAMIDOL (ISOVUE-300) INJECTION 61%
INTRAVENOUS | Status: AC
  Administered 2017-10-24: 15 mL
  Filled 2017-10-24: qty 30

## 2017-10-24 MED ORDER — HYDROMORPHONE BOLUS VIA INFUSION: 0.5000 mg | INTRAVENOUS | Status: DC | PRN

## 2017-10-24 MED ORDER — IOHEXOL 300 MG/ML  SOLN
100.0000 mL | Freq: Once | INTRAMUSCULAR | Status: AC | PRN
  Administered 2017-10-24: 100 mL via INTRAVENOUS

## 2017-10-24 MED ORDER — MUPIROCIN 2 % EX OINT
1.0000 "application " | TOPICAL_OINTMENT | Freq: Two times a day (BID) | CUTANEOUS | Status: AC
  Administered 2017-10-24 – 2017-10-28 (×10): 1 via NASAL
  Filled 2017-10-24: qty 22

## 2017-10-24 MED ORDER — INSULIN DETEMIR 100 UNIT/ML ~~LOC~~ SOLN
10.0000 [IU] | Freq: Two times a day (BID) | SUBCUTANEOUS | Status: DC
  Administered 2017-10-24 – 2017-11-01 (×14): 10 [IU] via SUBCUTANEOUS
  Filled 2017-10-24 (×17): qty 0.1

## 2017-10-24 MED ORDER — HYDROMORPHONE HCL 1 MG/ML IJ SOLN
0.5000 mg | INTRAMUSCULAR | Status: DC | PRN
  Administered 2017-10-25 – 2017-11-01 (×9): 0.5 mg via INTRAVENOUS
  Filled 2017-10-24 (×6): qty 0.5

## 2017-10-24 MED ORDER — POTASSIUM CHLORIDE CRYS ER 20 MEQ PO TBCR
40.0000 meq | EXTENDED_RELEASE_TABLET | Freq: Once | ORAL | Status: AC
  Administered 2017-10-24: 40 meq via ORAL
  Filled 2017-10-24: qty 2

## 2017-10-24 MED ORDER — INSULIN ASPART 100 UNIT/ML ~~LOC~~ SOLN
3.0000 [IU] | Freq: Three times a day (TID) | SUBCUTANEOUS | Status: DC
  Administered 2017-10-24 – 2017-11-01 (×18): 3 [IU] via SUBCUTANEOUS

## 2017-10-24 MED ORDER — CHLORHEXIDINE GLUCONATE CLOTH 2 % EX PADS
6.0000 | MEDICATED_PAD | Freq: Every day | CUTANEOUS | Status: AC
  Administered 2017-10-24 – 2017-10-28 (×3): 6 via TOPICAL

## 2017-10-24 MED ORDER — IOPAMIDOL (ISOVUE-300) INJECTION 61%: 15.0000 mL | Freq: Once | INTRAVENOUS | Status: AC | PRN

## 2017-10-24 NOTE — Progress Notes (Addendum)
PROGRESS NOTE    [name]  JZP:[id number] DOB: [birth year] DOA: 10/20/2017 PCP: [name], MD (Inactive)     Brief Narrative:  [name] is a 67 y.o. male with medical history significant for atrial fibrillation on Xarelto, coronary artery disease, insulin-dependent diabetes mellitus, chronic diastolic CHF, chronic low back pain, and microcytic anemia, now presenting to the emergency department from his SNF for evaluation of fevers and decreased responsiveness.  Patient was discharged from the hospital 1 week ago after management of septic shock and ventilator dependent respiratory failure, suspected secondary to aspiration pneumonia and complicated by C. difficile infection.  He had reportedly been recovering well at his SNF before he was noted to be listless and febrile on 4/11.  In the emergency department, he was found to be in atrial fibrillation with RVR, and concern for severe sepsis with lactic acid 4.1.  He was started on empiric vancomycin, merrem and admitted to stepdown unit. With improvement in HR, he was transitioned off cardizem gtt and transferred to telemetry unit.   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)   Atrial fibrillation with rapid ventricular response (HCC)   Acute encephalopathy   Protein-calorie malnutrition, severe (HCC)   Sepsis (HCC)   Occult blood in stools   Severe sepsis secondary to suspected source of sacral ulcer stage IV, POA, vs acinetobacter chronic indwelling catheter related UTI, POA,  - Recent hx of VRE in urine and ESBL bacteremia   - Blood culture negative to date - Solucortef for suspected adrenal insufficiency in setting of hypotension, continue to wean  - Wound RN  recommendation noted, consulted general surgery for potential debridement of sacral decub  - PT consulted for hydrotherapy  - Palliative care following for pain management, started on PCA pump 4/14  - Air mattress ordered  - Urine culture with > 100,000 acinetobacter calcoaceticus/baumannii, multi-drug resistant  - ID consulted today, antibiotic changed to Unasyn to cover urinary source  - Exchange foley catheter today  - Reviewed general surgery plan for CT A/P to eval possible scrotal abscess   Atrial fibrillation with RVR  - CHADS-VASc is at least 4 (age, CHF, DM, CAD). Xarelto on hold since admission - Rate controlled and off cardizem gtt. Will hold oral cardizem due to pause on tele overnight 4/13   Chronic microcytic anemia - Baseline Hgb around 8 - Positive FOBT on admission  - Hold Xarelto - BM without gross bleeding  - Monitor CBC, has been stable and no further report of gross GI bleeding  Chronic diastolic CHF  - Given IVF due to sepsis and low BP - Monitor volume status  - Stable   Insulin-dependent DM  - Ha1c 6.1  - Levemir, SSI. Increase insulin dosing in setting of solucortef administration   CAD - Continue lipitor, hold ASA initially while following CBC    Acute metabolic encephalopathy  - Resolved, seems to be back to his baseline   DVT prophylaxis: SCD Code Status: DNR Family Communication: No family at bedside  Disposition Plan: Pending clinical improvement in wound, sepsis. Will need SNF on discharge    Consultants:   General surgery   ID  Procedures:   None   Antimicrobials:  Anti-infectives (From admission, onward)  Start     Dose/Rate Route Frequency Ordered Stop   10/24/17 1200  Ampicillin-Sulbactam (UNASYN) 3 g in sodium chloride 0.9 % 100 mL IVPB     3 g 200 mL/hr over 30 Minutes Intravenous Every 6 hours 10/24/17 1030     10/22/17 1000  vancomycin (VANCOCIN) 1,250 mg in sodium chloride 0.9 % 250 mL IVPB  Status:  Discontinued      1,250 mg 166.7 mL/hr over 90 Minutes Intravenous Every 12 hours 10/22/17 0958 10/24/17 1030   10/20/17 2200  vancomycin (VANCOCIN) IVPB 1000 mg/200 mL premix  Status:  Discontinued     1,000 mg 200 mL/hr over 60 Minutes Intravenous Every 12 hours 10/20/17 2043 10/22/17 0958   10/20/17 2200  meropenem (MERREM) 1 g in sodium chloride 0.9 % 100 mL IVPB  Status:  Discontinued     1 g 200 mL/hr over 30 Minutes Intravenous Every 8 hours 10/20/17 2043 10/24/17 1030   10/20/17 2000  vancomycin (VANCOCIN) IVPB 1000 mg/200 mL premix  Status:  Discontinued     1,000 mg 200 mL/hr over 60 Minutes Intravenous  Once 10/20/17 1956 10/20/17 2051   10/20/17 1645  piperacillin-tazobactam (ZOSYN) IVPB 3.375 g     3.375 g 100 mL/hr over 30 Minutes Intravenous  Once 10/20/17 1636 10/20/17 1830   10/20/17 1645  vancomycin (VANCOCIN) IVPB 1000 mg/200 mL premix     1,000 mg 200 mL/hr over 60 Minutes Intravenous  Once 10/20/17 1636 10/20/17 1924       Subjective: Patient continues to complain of pain despite PCA pump. Wants more pain medication. No acute events overnight.   Objective: Vitals:   10/24/17 0010 10/24/17 0341 10/24/17 0342 10/24/17 0343  BP: 105/66  121/75   Pulse: 84  (!) 102   Resp: 18 20 20    Temp: 97.6 F (36.4 C)  (!) 97.5 F (36.4 C)   TempSrc: Oral  Oral   SpO2: 95% 93% 93%   Weight:    110.4 kg (243 lb 6.2 oz)  Height:        Intake/Output Summary (Last 24 hours) at 10/24/2017 1207 Last data filed at 10/24/2017 0930 Gross per 24 hour  Intake 1050 ml  Output 420 ml  Net 630 ml   Filed Weights   10/20/17 1538 10/21/17 0111 10/24/17 0343  Weight: 121.6 kg (268 lb) 114 kg (251 lb 5.2 oz) 110.4 kg (243 lb 6.2 oz)    Examination: General exam: Appears calm and comfortable  Respiratory system: Clear to auscultation. Respiratory effort normal. Cardiovascular system: S1 & S2 heard, Irreg rhythm rate controlled. No JVD, murmurs, rubs, gallops or clicks. +LLE pedal  edema. Gastrointestinal system: Abdomen is nondistended, soft and nontender. No organomegaly or masses felt. Normal bowel sounds heard. Central nervous system: Alert and oriented. No focal neurological deficits. Extremities: +right AKA     Data Reviewed: I have personally reviewed following labs and imaging studies  CBC: Recent Labs  Lab 10/20/17 1630 10/21/17 0122 10/22/17 0350 10/23/17 0443 10/24/17 0531  WBC 11.7* 10.8* 7.1 14.4* 17.3*  NEUTROABS 9.5* 9.6*  --   --   --   HGB 8.5* 7.4* 7.1* 7.8* 8.0*  HCT 28.3* 23.9* 23.3* 26.1* 27.2*  MCV 78.2 76.8* 77.9* 78.4 79.1  PLT 317 262 302 421* 506*   Basic Metabolic Panel: Recent Labs  Lab 10/20/17 1630 10/21/17 0122 10/22/17 0350 10/23/17 0443 10/24/17 0531  NA 135 135 137 139 137  K 3.9 3.6 2.8* 3.5 3.4*  CL  94* 100* 102 105 103  CO2 28 23 26 24 25   GLUCOSE 191* 222* 286* 265* 326*  BUN 13 12 12 11 11   CREATININE 1.17 1.00 0.73 0.74 0.81  CALCIUM 7.9* 7.3* 7.6* 7.5* 7.3*  MG  --  1.6* 2.1  --   --    GFR: Estimated Creatinine Clearance: 115.1 mL/min (by C-G formula based on SCr of 0.81 mg/dL). Liver Function Tests: Recent Labs  Lab 10/20/17 1630  AST 22  ALT 9*  ALKPHOS 132*  BILITOT 0.6  PROT 6.3*  ALBUMIN 1.7*   No results for input(s): LIPASE, AMYLASE in the last 168 hours. No results for input(s): AMMONIA in the last 168 hours. Coagulation Profile: Recent Labs  Lab 10/20/17 1630  INR 2.54   Cardiac Enzymes: No results for input(s): CKTOTAL, CKMB, CKMBINDEX, TROPONINI in the last 168 hours. BNP (last 3 results) No results for input(s): PROBNP in the last 8760 hours. HbA1C: No results for input(s): HGBA1C in the last 72 hours. CBG: Recent Labs  Lab 10/23/17 0739 10/23/17 1151 10/23/17 1658 10/23/17 2058 10/24/17 0743  GLUCAP 253* 174* 205* 211* 339*   Lipid Profile: No results for input(s): CHOL, HDL, LDLCALC, TRIG, CHOLHDL, LDLDIRECT in the last 72 hours. Thyroid Function Tests: No  results for input(s): TSH, T4TOTAL, FREET4, T3FREE, THYROIDAB in the last 72 hours. Anemia Panel: No results for input(s): VITAMINB12, FOLATE, FERRITIN, TIBC, IRON, RETICCTPCT in the last 72 hours. Sepsis Labs: Recent Labs  Lab 10/20/17 1630 10/20/17 2054 10/21/17 0122  PROCALCITON  --  0.15  --   LATICACIDVEN 4.10* 2.2* 0.9    Recent Results (from the past 240 hour(s))  Culture, blood (Routine x 2)     Status: None (Preliminary result)   Collection Time: 10/20/17  4:30 PM  Result Value Ref Range Status   Specimen Description   Final    BLOOD LEFT ANTECUBITAL Performed at [facility], [address], [place], [place] [address]    Special Requests   Final    BOTTLES DRAWN AEROBIC AND ANAEROBIC Blood Culture adequate volume Performed at [facility], [address], [place], [place] [address]    Culture   Final    NO GROWTH 3 DAYS Performed at [facility], [address], [place], [place] [address]    Report Status PENDING  Incomplete  Urine culture     Status: Abnormal   Collection Time: 10/20/17  4:58 PM  Result Value Ref Range Status   Specimen Description   Final    URINE, RANDOM Performed at [facility], [address], [place], [place] [address]    Special Requests   Final    NONE Performed at [facility], [address], [place], [place] [address]    Culture (A)  Final    >=100,000 COLONIES/mL ACINETOBACTER CALCOACETICUS/BAUMANNII COMPLEX   Report Status 10/24/2017 FINAL  Final   Organism ID, Bacteria ACINETOBACTER CALCOACETICUS/BAUMANNII COMPLEX (A)  Final      Susceptibility   Acinetobacter calcoaceticus/baumannii complex - MIC*    CEFTAZIDIME >=64 RESISTANT Resistant     CEFTRIAXONE 32 INTERMEDIATE Intermediate     CIPROFLOXACIN >=4 RESISTANT Resistant     GENTAMICIN 8 INTERMEDIATE Intermediate     IMIPENEM >=16 RESISTANT Resistant     PIP/TAZO >=128 RESISTANT Resistant      TRIMETH/SULFA >=320 RESISTANT Resistant     CEFEPIME 32 RESISTANT Resistant     AMPICILLIN/SULBACTAM RESISTANT Resistant     * >=  100,000 COLONIES/mL ACINETOBACTER CALCOACETICUS/BAUMANNII COMPLEX  MRSA PCR Screening     Status: Abnormal   Collection Time: 10/21/17  1:12 AM  Result Value Ref Range Status   MRSA by PCR POSITIVE (A) NEGATIVE Final    Comment:        The GeneXpert MRSA Assay (FDA approved for NASAL specimens only), is one component of a comprehensive MRSA colonization surveillance program. It is not intended to diagnose MRSA infection nor to guide or monitor treatment for MRSA infections. RESULT CALLED TO, READ BACK BY AND VERIFIED WITH: Ranell Patrick @0525  10/21/17 MKELLY Performed at [facility], [address] [place], [place]        Radiology Studies: No results found.    Scheduled Meds: . atorvastatin  80 mg Oral q1800  . calcium-vitamin D  1 tablet Oral Daily  . Chlorhexidine Gluconate Cloth  6 each Topical Q0600  . feeding supplement (GLUCERNA SHAKE)  237 mL Oral TID BM  . feeding supplement (PRO-STAT SUGAR FREE 64)  30 mL Oral TID WC  . gabapentin  600 mg Oral BID  . [START ON 10/25/2017] hydrocortisone sod succinate (SOLU-CORTEF) inj  50 mg Intravenous Daily  . HYDROmorphone   Intravenous Q4H  . insulin aspart  0-9 Units Subcutaneous TID WC  . insulin aspart  3 Units Subcutaneous TID WC  . insulin detemir  10 Units Subcutaneous BID  . multivitamin with minerals  1 tablet Oral Daily  . mupirocin ointment  1 application Nasal BID  . pantoprazole  40 mg Oral Daily  . sodium chloride flush  3 mL Intravenous Q12H   Continuous Infusions: . ampicillin-sulbactam (UNASYN) IV       LOS: 4 days    Time spent: 20 minutes   10/27/2017, DO [name] [email or web address] Password [facility] 10/24/2017, 12:07 PM

## 2017-10-24 NOTE — Consult Note (Signed)
[facility]       Reason for Consult: MDR Acinetobacter    Referring Physician: Dr. 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 atorvastatin  80 mg Oral q1800  . calcium-vitamin D  1 tablet Oral Daily  . Chlorhexidine Gluconate Cloth  6 each Topical Q0600  . feeding supplement (GLUCERNA SHAKE)  237 mL Oral TID BM  . feeding supplement (PRO-STAT SUGAR FREE 64)  30 mL Oral TID WC  . gabapentin  600 mg Oral BID  . hydrocortisone sod succinate (SOLU-CORTEF) inj  50 mg Intravenous Q12H  . HYDROmorphone   Intravenous Q4H  . insulin aspart  0-9 Units Subcutaneous TID WC  . insulin detemir  5 Units Subcutaneous BID  . multivitamin with minerals  1 tablet Oral Daily  . mupirocin ointment  1 application Nasal BID  . pantoprazole  40 mg Oral Daily  . sodium chloride flush  3 mL Intravenous Q12H    Recommendations: Amp/sulbactam Stop vancomycin and meropenem  Change Foley  Remove catheter as soon as possible  Assessment: He came in with fever and decreased responsiveness reported at his SNF, elevated lactate, hypotension that responded to IV fluids.  His sacral wound was evaluated by surgery and infection not suspected, though needs significant wound care. His altered responsiveness has resolved and he is no longer febrile.  He has had a chronic foley with bacterial growth now with MDR Acinetobacter.  This could be the source but no particular symptoms that can be identified. Therefore will treat with sulbactam for 2-3 days.  Typically this requires dual  therapy or Tigecycline but will try sulbactam now.  He will need his Foley changed Leukocytosis - was normal on admission and increased with wound care.  I suspect this is transient with his wound manipulation and hydrotherapy.    Antibiotics: Vancomycin and meropenem Day 5 total antibiotics  HPI: [name] is a 67 y.o. male with deconditioning, right AKA, SNF resident and multiple other medical problems and recent hospitalizations who came in with decreased responsiveness and fever.  Since admission fever has resolved.  He has been on broad spectrum antibiotics.  Wound care noted his sacral decubitus ulcer requiring signficant debridment and surgery following.  Getting hydrotherapy as above but no obvious infection (some necrotic tissue).  Recent CT without any particular osseus findings.  At this time his main complaint is pain in his sacral area.  Lactate was elevated and hypotensive but did not require pressor support.    Review of Systems:  Constitutional: negative for fevers and chills Gastrointestinal: negative for diarrhea Integument/breast: negative for rash All other systems reviewed and are negative    Past Medical History:  Diagnosis Date  . Arthritis    "hands and lower back" (09/19/2014)  . CAD (coronary artery disease)    a. s/p PCI to RCA in 2012. b. prior cath in 01/2014 with elevated L/RH pressures, mild-mod CAD of LAD/LCx with patent RCA, normal EF, c/b CIN/CHF.  [name] Carpal tunnel syndrome, bilateral   . Cellulitis   .  Chronic diastolic CHF (congestive heart failure) (HCC)   . Chronic kidney disease (CKD), stage III (moderate) (HCC)   . Chronic lower back pain   . Diabetes (HCC)   . DKA (diabetic ketoacidoses) (HCC) 03/2017  . History of blood transfusion ~ 1954   "related to OR"  . Hyperlipidemia   . Hypertension   . Hypoxia    a. Qualified for home O2 at [place] in 09/2014.  [name] Lower GI bleed   . Microcytic anemia   . Morbid obesity (HCC)   . Neuropathy   . OSA  (obstructive sleep apnea)    "I wear nasal prongs; haven't been using prongs recently" (09/19/2014)  . PAF (paroxysmal atrial fibrillation) (HCC)    TEE DCCV 09/23/2014  . Physical deconditioning   . Pilonidal cyst 1980's; 01/25/2013  . Scrotal abscess   . Type II diabetes mellitus (HCC)     Social History   Tobacco Use  . Smoking status: Former Smoker    Packs/day: 1.00    Years: 20.00    Pack years: 20.00    Types: Cigarettes    Last attempt to quit: 07/13/1983    Years since quitting: 34.3  . Smokeless tobacco: Never Used  Substance Use Topics  . Alcohol use: No  . Drug use: No    Family History  Adopted: Yes  Problem Relation Age of Onset  . Other Other        PT ADOPTED    No Known Allergies  Physical Exam: Constitutional: in no apparent distress and alert  Vitals:   10/24/17 0341 10/24/17 0342  BP:  121/75  Pulse:  (!) 102  Resp: 20 20  Temp:  (!) 97.5 F (36.4 C)  SpO2: 93% 93%   EYES: anicteric ENMT: no thrush Cardiovascular: Cor RRR Respiratory: CTA B; normal respiratory effort GI: Bowel sounds are normal, liver is not enlarged, spleen is not enlarged Musculoskeletal: right AKA Skin: negatives: no rash Hematologic: no cervical lad  Lab Results  Component Value Date   WBC 17.3 (H) 10/24/2017   HGB 8.0 (L) 10/24/2017   HCT 27.2 (L) 10/24/2017   MCV 79.1 10/24/2017   PLT 506 (H) 10/24/2017    Lab Results  Component Value Date   CREATININE 0.81 10/24/2017   BUN 11 10/24/2017   NA 137 10/24/2017   K 3.4 (L) 10/24/2017   CL 103 10/24/2017   CO2 25 10/24/2017    Lab Results  Component Value Date   ALT 9 (L) 10/20/2017   AST 22 10/20/2017   ALKPHOS 132 (H) 10/20/2017     Microbiology: Recent Results (from the past 240 hour(s))  Culture, blood (Routine x 2)     Status: None (Preliminary result)   Collection Time: 10/20/17  4:30 PM  Result Value Ref Range Status   Specimen Description   Final    BLOOD LEFT ANTECUBITAL Performed at [facility], [address], [place], [place] [address]    Special Requests   Final    BOTTLES DRAWN AEROBIC AND ANAEROBIC Blood Culture adequate volume Performed at [facility], [address] [place], [place] [address]    Culture   Final    NO GROWTH 3 DAYS Performed at [facility], [address], [place], [place] [address]    Report Status PENDING  Incomplete  Urine culture     Status: Abnormal   Collection Time: 10/20/17  4:58 PM  Result Value Ref Range Status  Specimen Description   Final    URINE, RANDOM Performed at [facility], [address] [place], [place] [address]    Special Requests   Final    NONE Performed at [facility], [address], [place], [place] [address]    Culture (A)  Final    >=100,000 COLONIES/mL ACINETOBACTER CALCOACETICUS/BAUMANNII COMPLEX   Report Status 10/24/2017 FINAL  Final   Organism ID, Bacteria ACINETOBACTER CALCOACETICUS/BAUMANNII COMPLEX (A)  Final      Susceptibility   Acinetobacter calcoaceticus/baumannii complex - MIC*    CEFTAZIDIME >=64 RESISTANT Resistant     CEFTRIAXONE 32 INTERMEDIATE Intermediate     CIPROFLOXACIN >=4 RESISTANT Resistant     GENTAMICIN 8 INTERMEDIATE Intermediate     IMIPENEM >=16 RESISTANT Resistant     PIP/TAZO >=128 RESISTANT Resistant     TRIMETH/SULFA >=320 RESISTANT Resistant     CEFEPIME 32 RESISTANT Resistant     AMPICILLIN/SULBACTAM RESISTANT Resistant     * >=100,000 COLONIES/mL ACINETOBACTER CALCOACETICUS/BAUMANNII COMPLEX  MRSA PCR Screening     Status: Abnormal   Collection Time: 10/21/17  1:12 AM  Result Value Ref Range Status   MRSA by PCR POSITIVE (A) NEGATIVE Final    Comment:        The GeneXpert MRSA Assay (FDA approved for NASAL specimens only), is one component of a comprehensive MRSA colonization surveillance program. It is not intended to diagnose MRSA infection nor to guide or monitor treatment  for MRSA infections. RESULT CALLED TO, READ BACK BY AND VERIFIED WITH: K JACKSON,RN @0525  10/21/17 MKELLY Performed at [facility], [address], [place], [place]     [id number], MD [facility] [email or web address] [facility] pager  [phone] cell 10/24/2017, 10:26 AM

## 2017-10-24 NOTE — Progress Notes (Signed)
Pt noticeably soiled. This RN and NT offered pt a bath but pt refused. Pt educated on importance of getting cleaned after having a BM. Pt verbalizes understanding but continues to refuse. Will offer again later.

## 2017-10-24 NOTE — Progress Notes (Signed)
CC:  Decubitus  Subjective: Sleeping or uncomfortable, does not like to be turned.  Scrotum is swollen and he has a large open site at the base of the scrotum that is drainage purulent fluid.    Objective: Vital signs in last 24 hours: Temp:  [97.5 F (36.4 C)-97.8 F (36.6 C)] 97.5 F (36.4 C) (04/15 0342) Pulse Rate:  [84-102] 102 (04/15 0342) Resp:  [15-20] 20 (04/15 0342) BP: (105-121)/(66-75) 121/75 (04/15 0342) SpO2:  [93 %-97 %] 93 % (04/15 0342) Weight:  [110.4 kg (243 lb 6.2 oz)] 110.4 kg (243 lb 6.2 oz) (04/15 0343) Last BM Date: 10/23/17 Nothing PO recorded 800 IV Urine 420 recorded Stool x 1 recorded Afebrile, VSS WBC is up, to 17.3  Glucose 174-339 this AM  Intake/Output from previous day: 04/14 0701 - 04/15 0700 In: 800 [IV Piggyback:800] Out: 420 [Urine:420] Intake/Output this shift: No intake/output data recorded.   Pt is on his left side and he has this purulent fluid draining out from the base of the scrotum, he has an  enormously swollen scrotum also.  It is all extremely painful. Opening is 1-2 cm deep with undermining.  General appearance: alert and doesn't like to move, and not enough pain meds even with PCA Skin: Picture above, large amount of dead necrotic tissue base of the decubitus, drainage from the base of the scrotum with that is purulent.  Lab Results:  Recent Labs    10/23/17 0443 10/24/17 0531  WBC 14.4* 17.3*  HGB 7.8* 8.0*  HCT 26.1* 27.2*  PLT 421* 506*    BMET Recent Labs    10/23/17 0443 10/24/17 0531  NA 139 137  K 3.5 3.4*  CL 105 103  CO2 24 25  GLUCOSE 265* 326*  BUN 11 11  CREATININE 0.74 0.81  CALCIUM 7.5* 7.3*   PT/INR No results for input(s): LABPROT, INR in the last 72 hours.  Recent Labs  Lab 10/20/17 1630  AST 22  ALT 9*  ALKPHOS 132*  BILITOT 0.6  PROT 6.3*  ALBUMIN 1.7*     Lipase  No results found for: LIPASE   Prior to Admission medications   Medication Sig Start Date End Date  Taking? Authorizing Provider  acetaminophen (TYLENOL) 325 MG tablet Take 2 tablets (650 mg total) by mouth every 4 (four) hours as needed for mild pain (temp > 101.5). Patient taking differently: Take 650 mg by mouth every 4 (four) hours as needed for fever (temp > 101.5).  10/14/17  Yes Emokpae, Courage, MD  acetaminophen (TYLENOL) 500 MG tablet Take 500 mg by mouth every 6 (six) hours as needed for mild pain.    Yes [provider]  albuterol (PROVENTIL) (2.5 MG/3ML) 0.083% nebulizer solution Take 3 mLs (2.5 mg total) by nebulization every 4 (four) hours as needed for wheezing or shortness of breath. 10/14/17  Yes Shon Hale, MD  aluminum-magnesium hydroxide-simethicone (MAALOX) 200-200-20 MG/5ML SUSP Take 30 mLs by mouth every 4 (four) hours as needed (heartburn).   Yes [provider]  Amino Acids-Protein Hydrolys (FEEDING SUPPLEMENT, PRO-STAT SUGAR FREE 64,) LIQD Take 30 mLs by mouth 3 (three) times daily with meals. 04/21/17  Yes Sheikh, Omair Latif, DO  aspirin EC 81 MG EC tablet Take 1 tablet (81 mg total) by mouth daily. 06/17/16  Yes Richarda Overlie, MD  atorvastatin (LIPITOR) 80 MG tablet Take 1 tablet (80 mg total) by mouth daily. 05/25/16  Yes [name], MD  calcium carbonate (TUMS -  DOSED IN MG ELEMENTAL CALCIUM) 500 MG chewable tablet Chew 2 tablets (400 mg of elemental calcium total) by mouth 2 (two) times daily as needed for indigestion or heartburn. 10/14/17  Yes Emokpae, Courage, MD  calcium-vitamin D (OSCAL WITH D) 500-200 MG-UNIT tablet Take 1 tablet by mouth daily.   Yes [provider]  collagenase (SANTYL) ointment Apply 1 application topically daily.   Yes [provider]  diphenhydrAMINE (BENADRYL) 25 MG tablet Take 25 mg by mouth every 4 (four) hours as needed for itching.   Yes [provider]  famotidine (PEPCID) 20 MG tablet Take 1 tablet (20 mg total) by mouth at bedtime. 10/14/17  Yes Emokpae, Courage, MD  feeding  supplement, GLUCERNA SHAKE, (GLUCERNA SHAKE) LIQD Take 237 mLs by mouth 3 (three) times daily between meals. 04/21/17  Yes Sheikh, Omair Latif, DO  ferrous sulfate 325 (65 FE) MG tablet Take 1 tablet (325 mg total) by mouth 2 (two) times daily with a meal. 09/13/17  Yes Alwyn Ren, MD  furosemide (LASIX) 20 MG tablet Take 1 tablet (20 mg total) by mouth daily. 10/14/17 11/13/17 Yes Emokpae, Courage, MD  gabapentin (NEURONTIN) 300 MG capsule Take 2 capsules (600 mg total) by mouth 2 (two) times daily. 05/25/16  Yes Langeland, Dawn T, MD  hydrocerin (EUCERIN) CREA Apply 1 application topically 3 (three) times daily. legs 05/25/16  Yes Langeland, Dawn T, MD  insulin aspart (NOVOLOG FLEXPEN) 100 UNIT/ML FlexPen Inject 2-10 Units into the skin 4 (four) times daily -  with meals and at bedtime. Per sliding scale.. 150-200=2 units 201-250=4 units 251-300=6 units 301-350=8 units 351-400=10 units Over 400 call MD   Yes [provider]  insulin detemir (LEVEMIR) 100 UNIT/ML injection Inject 0.1 mLs (10 Units total) into the skin daily. Patient taking differently: Inject 10-14 Units into the skin 2 (two) times daily. 10 units qam at 0900, 14 units qhs 10/15/17  Yes Emokpae, Courage, MD  loperamide (IMODIUM) 2 MG capsule Take 1 capsule (2 mg total) by mouth every 6 (six) hours as needed for diarrhea or loose stools. 09/13/17  Yes Alwyn Ren, MD  magnesium oxide (MAG-OX) 400 (241.3 Mg) MG tablet Take 1 tablet (400 mg total) by mouth 2 (two) times daily. 10/14/17  Yes Emokpae, Courage, MD  metoprolol tartrate (LOPRESSOR) 25 MG tablet Take 1 tablet (25 mg total) by mouth 2 (two) times daily. 10/14/17 10/14/18 Yes Emokpae, Courage, MD  Multiple Vitamin (MULTIVITAMIN WITH MINERALS) TABS Take 1 tablet by mouth daily.   Yes [provider]  ondansetron (ZOFRAN) 4 MG tablet Take 4 mg by mouth every 8 (eight) hours as needed for nausea or vomiting.   Yes [provider]  Oxycodone HCl 20  MG TABS Take 1 tablet (20 mg total) by mouth every 4 (four) hours as needed (pain). 10/14/17  Yes Emokpae, Courage, MD  OXYGEN Inhale 2-3 L into the lungs continuous.   Yes [provider]  potassium & sodium phosphates (PHOS-NAK) 280-160-250 MG PACK Take 1 packet by mouth 4 (four) times daily -  with meals and at bedtime. 09/13/17  Yes Alwyn Ren, MD  vitamin C (VITAMIN C) 250 MG tablet Take 1 tablet (250 mg total) by mouth 2 (two) times daily. 10/14/17  Yes Emokpae, Courage, MD  XARELTO 20 MG TABS tablet TAKE 1 TABLET BY MOUTH EVERY DAY WITH SUPPER 04/05/17  Yes Jegede, Olugbemiga E, MD  lidocaine (LMX) 4 % cream Apply topically daily as needed (during hydro).  04/21/17   Marguerita Merles Latif, DO  protein supplement shake (PREMIER PROTEIN) LIQD Take 325 mLs (11 oz total) by mouth daily. 10/15/17   Shon Hale, MD    Medications: . atorvastatin  80 mg Oral q1800  . calcium-vitamin D  1 tablet Oral Daily  . feeding supplement (GLUCERNA SHAKE)  237 mL Oral TID BM  . feeding supplement (PRO-STAT SUGAR FREE 64)  30 mL Oral TID WC  . gabapentin  600 mg Oral BID  . hydrocortisone sod succinate (SOLU-CORTEF) inj  50 mg Intravenous Q12H  . HYDROmorphone   Intravenous Q4H  . insulin aspart  0-9 Units Subcutaneous TID WC  . insulin detemir  5 Units Subcutaneous BID  . multivitamin with minerals  1 tablet Oral Daily  . pantoprazole  40 mg Oral Daily  . potassium chloride  40 mEq Oral Once  . sodium chloride flush  3 mL Intravenous Q12H   . meropenem (MERREM) IV Stopped (10/24/17 0552)  . vancomycin Stopped (10/23/17 2346)   Anti-infectives (From admission, onward)   Start     Dose/Rate Route Frequency Ordered Stop   10/22/17 1000  vancomycin (VANCOCIN) 1,250 mg in sodium chloride 0.9 % 250 mL IVPB     1,250 mg 166.7 mL/hr over 90 Minutes Intravenous Every 12 hours 10/22/17 0958     10/20/17 2200  vancomycin (VANCOCIN) IVPB 1000 mg/200 mL premix  Status:  Discontinued     1,000  mg 200 mL/hr over 60 Minutes Intravenous Every 12 hours 10/20/17 2043 10/22/17 0958   10/20/17 2200  meropenem (MERREM) 1 g in sodium chloride 0.9 % 100 mL IVPB     1 g 200 mL/hr over 30 Minutes Intravenous Every 8 hours 10/20/17 2043     10/20/17 2000  vancomycin (VANCOCIN) IVPB 1000 mg/200 mL premix  Status:  Discontinued     1,000 mg 200 mL/hr over 60 Minutes Intravenous  Once 10/20/17 1956 10/20/17 2051   10/20/17 1645  piperacillin-tazobactam (ZOSYN) IVPB 3.375 g     3.375 g 100 mL/hr over 30 Minutes Intravenous  Once 10/20/17 1636 10/20/17 1830   10/20/17 1645  vancomycin (VANCOCIN) IVPB 1000 mg/200 mL premix     1,000 mg 200 mL/hr over 60 Minutes Intravenous  Once 10/20/17 1636 10/20/17 1924      Assessment/Plan Hx of septic shock/VDRF on Vent\aspiration pneumonia Right 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 Active Problems:   ANEMIA-IRON DEFICIENCY   CAD S/P percutaneous coronary angioplasty   Chronic diastolic CHF (congestive heart failure) (HCC)   Pressure ulcer   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   Possible scrotal abscess - see picture above    -  Scrotal abscess I&D 12/27/15 Dr. Hadley Pen  - will review with Dr. Johna Sheriff, but I think we need to get a repeat CT and may need to include both thighs  to see to the base of this.   FEN:  Carb Mod ID:  Vancomycin 4/11 =>> day 4; Zosyn x 1 dose; Meropenum 4/11 =>> day 4 DVT: SCD's only Foley:   In Follow up:  Wound Clinic   Plan:  Will review with Dr. Johna Sheriff, and obtain a new CT scan.  He may end up having  EUA with debridement/ anesthesia.  Dr. Hadley Pen, drained abscess in 2017.  He is not on anticoagulants currently.        LOS: 4 days    Corbett Moulder 10/24/2017 [phone]

## 2017-10-24 NOTE — Care Management Important Message (Addendum)
Important Message  Patient Details IM Letter given to Kathy/Case Manager to present to the Patient Name: [name] MRN: [id number] Date of Birth: December 11, 1950   Medicare Important Message Given:  Yes    Caren Macadam 10/24/2017, 1:03 PMImportant Message  Patient Details  Name: [name] MRN: [id number] Date of Birth: 12-31-1950   Medicare Important Message Given:  Yes    Caren Macadam 10/24/2017, 1:03 PM

## 2017-10-24 NOTE — Progress Notes (Signed)
Physical Therapy Hydrotherapy Treatment Note  Will, surigcal PA, in to see pt during session and reports pt likely to go to OR however requested continuing hydrotherapy treatment today.  Will check back after surgical plans made to see if hydrotherapy would still be beneficial.   10/24/17 1200  Subjective Assessment  Subjective I need more medicine (RN premedicated and pt has PCA)  Patient and Family Stated Goals none stated  Prior Treatments dressing changes  Evaluation and Treatment  Evaluation and Treatment Procedures Explained to Patient/Family Yes  Evaluation and Treatment Procedures agreed to  Wound / Incision (Open or Dehisced) 10/22/17 Other (Comment) Sacrum Medial large wound contaminated  with BM  Date First Assessed/Time First Assessed: 10/22/17 1444   Wound Type: Other (Comment)  Location: Sacrum  Location Orientation: Medial  Wound Description (Comments): large wound contaminated  with BM  Present on Admission: Yes  Dressing Type Moist to dry ([place])  Dressing Changed Changed  Dressing Status Old drainage  Dressing Change Frequency PRN  Site / Wound Assessment Friable;Black;Bleeding;Brown  % Wound base Red or Granulating 10%  % Wound base Yellow/Fibrinous Exudate 45%  % Wound base Black/Eschar 45%  Peri-wound Assessment Bleeding;Excoriated;Maceration  Margins Unattached edges (unapproximated)  Drainage Amount Moderate  Drainage Description Green;Purulent;Odor  Treatment Debridement (Selective);Hydrotherapy (Pulse lavage) ([place])  Hydrotherapy  Pulsed lavage therapy - wound location sacrum  Pulsed Lavage with Suction (psi) 8 psi  Pulsed Lavage with Suction - Normal Saline Used 1000 mL  Pulsed Lavage Tip Tip with splash shield  Selective Debridement  Selective Debridement - Location sacrum  Selective Debridement - Tools Used Forceps;Scalpel  Selective Debridement - Tissue Removed black necrotic tissue  Wound Therapy - Assess/Plan/Recommendations  Wound Therapy -  Clinical Statement Pt is a 67 year old male with large painful unstageable pressure injury.  The patient requires extensive assistance for positioning. Noted R AKA. The patient has resided in SNF for several months and previously received Hydrotherapy for a sacral wound.  The patient is limited in mobility . Recommend air mattress for the patient.  The  patient will benefit from PLS. Surgical PA in during session 10/24/17 for reevaluate the wound and reports pt will likely go to OR (MD to see pt still).  Wound Therapy - Functional Problem List total assistance for mobility.  Factors Delaying/Impairing Wound Healing Incontinence;Multiple medical problems;Immobility  Hydrotherapy Plan Debridement;Dressing change;Patient/family education;Pulsatile lavage with suction  Wound Therapy - Frequency 6X / week  Wound Therapy - Follow Up Recommendations Skilled nursing facility  Wound Plan PLS and dressing changes  Wound Therapy Goals - Improve the function of patient's integumentary system by progressing the wound(s) through the phases of wound healing by:  Decrease Necrotic Tissue to 50  Decrease Necrotic Tissue - Progress Progressing toward goal  Increase Granulation Tissue to 50  Increase Granulation Tissue - Progress Progressing toward goal  Goals/treatment plan/discharge plan were made with and agreed upon by patient/family No, Patient unable to participate in goals/treatment/discharge plan and family unavailable  Time For Goal Achievement 2 weeks  Wound Therapy - Potential for Goals Poor   Zenovia Jarred, PT, DPT 10/24/2017 Pager: [phone]

## 2017-10-24 NOTE — Progress Notes (Signed)
Daily Progress Note   Patient Name: [name]       Date: 10/24/2017 DOB: 25-Apr-1951  Age: 67 y.o. MRN#: [id number] Attending Physician: Noralee Stain, DO Primary Care Physician: [name], MD (Inactive) Admit Date: 10/20/2017  Reason for Consultation/Follow-up: Establishing goals of care and Pain control  Subjective: Discussed with bedside RN who reports that patient has been sleeping this afternoon with no complaints of pain.  I reviewed PCA and he has used 15mg  dilaudid since initiation of PCA.  He reports that he has been better controlled with pain, but still with breakthrough pain with any movement.  See below.  Length of Stay: 4  Current Medications: Scheduled Meds:  . atorvastatin  80 mg Oral q1800  . calcium-vitamin D  1 tablet Oral Daily  . Chlorhexidine Gluconate Cloth  6 each Topical Q0600  . feeding supplement (GLUCERNA SHAKE)  237 mL Oral TID BM  . feeding supplement (PRO-STAT SUGAR FREE 64)  30 mL Oral TID WC  . gabapentin  600 mg Oral BID  . [START ON 10/25/2017] hydrocortisone sod succinate (SOLU-CORTEF) inj  50 mg Intravenous Daily  . HYDROmorphone   Intravenous Q4H  . insulin aspart  0-9 Units Subcutaneous TID WC  . insulin aspart  3 Units Subcutaneous TID WC  . insulin detemir  10 Units Subcutaneous BID  . multivitamin with minerals  1 tablet Oral Daily  . mupirocin ointment  1 application Nasal BID  . pantoprazole  40 mg Oral Daily  . sodium chloride flush  3 mL Intravenous Q12H    Continuous Infusions: . ampicillin-sulbactam (UNASYN) IV 3 g (10/24/17 1655)    PRN Meds: acetaminophen **OR** acetaminophen, albuterol, diphenhydrAMINE **OR** diphenhydrAMINE, HYDROmorphone (DILAUDID) injection, iohexol, naloxone **AND** sodium chloride flush,  ondansetron **OR** ondansetron (ZOFRAN) IV, polyvinyl alcohol, white petrolatum  Physical Exam        .  Obese, pale in appearance  Cardiovascular: Normal rate. An irregularly irregular rhythm present.  Pulmonary/Chest: No accessory muscle usage. No tachypnea. No respiratory distress. He has decreased breath sounds.  Shallow breaths  Abdominal: Soft. Normal appearance.   Vital Signs: BP 116/71 (BP Location: Right Arm)   Pulse 95   Temp 97.7 F (36.5 C) (Oral)   Resp 18   Ht 6' (1.829 m)   Wt 110.4 kg (243  lb 6.2 oz)   SpO2 98%   BMI 33.01 kg/m  SpO2: SpO2: 98 % O2 Device: O2 Device: Nasal Cannula O2 Flow Rate: O2 Flow Rate (L/min): 2.5 L/min  Intake/output summary:   Intake/Output Summary (Last 24 hours) at 10/24/2017 1731 Last data filed at 10/24/2017 1500 Gross per 24 hour  Intake 1360 ml  Output 820 ml  Net 540 ml   LBM: Last BM Date: 10/24/17 Baseline Weight: Weight: 121.6 kg (268 lb) Most recent weight: Weight: 110.4 kg (243 lb 6.2 oz)       Palliative Assessment/Data:    Flowsheet Rows     Most Recent Value  Intake Tab  Referral Department  Hospitalist  Unit at Time of Referral  Med/Surg Unit  Palliative Care Primary Diagnosis  Sepsis/Infectious Disease  Date Notified  10/21/17  Palliative Care Type  Return patient Palliative Care  Reason for referral  Clarify Goals of Care, Pain  Date of Admission  10/20/17  Date first seen by Palliative Care  10/22/17  # of days Palliative referral response time  1 Day(s)  # of days IP prior to Palliative referral  1  Clinical Assessment  Palliative Performance Scale Score  50%  Pain Max last 24 hours  9  Pain Min Last 24 hours  3  Psychosocial & Spiritual Assessment  Palliative Care Outcomes      Patient Active Problem List   Diagnosis Date Noted  . Protein-calorie malnutrition, severe (HCC) 10/20/2017  . Sepsis (HCC) 10/20/2017  . Occult blood in stools 10/20/2017  . C. difficile diarrhea 10/12/2017  .  Hypomagnesemia 10/12/2017  . Septic shock (HCC) 09/29/2017  . Acute encephalopathy   . Palliative care encounter   . UTI (urinary tract infection) 09/01/2017  . Acute metabolic encephalopathy 09/01/2017  . S/P AKA (above knee amputation) unilateral, right (HCC) 09/01/2017  . Atrial fibrillation with rapid ventricular response (HCC)   . Hyponatremia 04/11/2017  . ARF (acute renal failure) (HCC) 04/11/2017  . Pressure injury of skin 03/24/2017  . Below knee amputation status, right (HCC) 03/23/2017  . DKA (diabetic ketoacidoses) (HCC) 03/14/2017  . Elevated troponin 03/14/2017  . Diabetic polyneuropathy associated with type 2 diabetes mellitus (HCC)   . Osteoarthritis of both knees   . Diabetic nephropathy associated with type 2 diabetes mellitus (HCC)   . Bilateral lower leg cellulitis 01/22/2017  . Venous stasis 08/06/2016  . At high risk for falls 07/21/2016  . CHF exacerbation (HCC) 06/10/2016  . Lactic acidosis 06/09/2016  . Syncope and collapse 03/05/2016  . Acute on chronic renal insufficiency 03/05/2016  . Obesity 12/30/2015  . Scrotal swelling   . Foot fracture   . 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 Lower GI bleed   . Pressure ulcer 12/24/2015  . Acute kidney injury (HCC) 12/23/2015  . Back pain 12/23/2015  . Fall   . Metatarsal fracture   . Traumatic rhabdomyolysis (HCC)   . Hyperkalemia   . Lisfranc dislocation   . Hyperglycemia   . Sepsis due to skin infection (HCC) 09/08/2015  . Scrotal wall abscess 09/08/2015  . AKI (acute kidney injury) (HCC) 09/08/2015  . Chest wall pain   . On amiodarone therapy 12/08/2014  . Respiratory failure with hypoxia (HCC) 12/04/2014  . Hypoxia 12/04/2014  . Acute on chronic respiratory failure with hypoxia (HCC)   . Atrial fibrillation with RVR (HCC) 09/20/2014  . Chronic diastolic CHF (congestive heart failure) (HCC) 09/19/2014  . Acute respiratory failure (  HCC) 09/19/2014  . Abnormal nuclear cardiac  imaging test 02/12/2014  . Dyspnea 02/12/2014  . Chronic anticoagulation- Coumadin 02/12/2014  . CKD (chronic kidney disease) stage 3, GFR 30-59 ml/min (HCC) 02/12/2014  . Chronic diastolic congestive heart failure (HCC) 02/11/2014  . Encounter for therapeutic drug monitoring 08/03/2013  . Pilonidal cyst 01/25/2013  . Perirectal cellulitis 09/06/2012  . Long term (current) use of anticoagulants 11/18/2011  . Obstructive sleep apnea- unable to tolerate c-pap 08/04/2011  . CAD S/P percutaneous coronary angioplasty 08/04/2011  . ANEMIA-IRON DEFICIENCY 01/06/2009  . OTHER AND UNSPECIFIED COAGULATION DEFECTS 01/06/2009  . Anemia 01/02/2009  . CARDIOVASCULAR FUNCTION STUDY, ABNORMAL 01/02/2009  . Essential hypertension, benign 10/22/2008  . Atrial fibrillation 10/22/2008  . Hyperlipidemia 09/23/2008  . Hypokalemia 09/23/2008  . Obesity, Class III, BMI 40-49.9 (morbid obesity) (HCC) 09/23/2008  . CELLULITIS AND ABSCESS OF LEG EXCEPT FOOT 09/23/2008    Palliative Care Assessment & Plan   Patient Profile: 67 y.o. male  with past medical history of hypertension, hyperlipidemia, diabetes mellitus, atrial fibrillation onXarelto, OSA not on CPAP, GI bleeding,dCHF,COPD, CKD3,s/p of right AKA admitted on 10/20/2017 with sepsis, a fib, and concern for GI bleeding.  This is his 3rd admission in 6 months and palliative was involved in care during recent admission with discharge on 4/5.  Palliative consulted for goals of care and assistance with pain management.  Recommendations/Plan: -Pain: Pain better controlled with initiation of PCA, but still with uncontrolled pain with any movement. Discussed pretreating prior to movement with patient and his bedside RN.  He has been averaging roughly .7mg  per hour for the last 8 hours.  Plan to continue dilaudid PCA with 0.3mg  basal and 0.3mg  bolus with 15 minute lockout.  Note plan for possible debridement with surgery.  Would continue PCA until after this  procedure.  I have discussed risk vs benefit of opioids and he reports pain control is most important thing to him.  He understands inherent risk and is agreeable to palliative PCA.   -Goals of care: Continues to defer conversation.  States today wants to wait until findings from surgery prior to further discussion.  Code Status:    Code Status Orders  (From admission, onward)        Start     Ordered   10/20/17 1953  Do not attempt resuscitation (DNR)  Continuous    Question Answer Comment  In the event of cardiac or respiratory ARREST Do not call a "code blue"   In the event of cardiac or respiratory ARREST Do not perform Intubation, CPR, defibrillation or ACLS   In the event of cardiac or respiratory ARREST Use medication by any route, position, wound care, and other measures to relive pain and suffering. May use oxygen, suction and manual treatment of airway obstruction as needed for comfort.      10/20/17 1956    Code Status History    Date Active Date Inactive Code Status Order ID Comments User Context   09/29/2017 0457 10/15/2017 0232 Full Code [id number]  Erin Fulling, MD ED   09/08/2017 1026 09/13/2017 2008 DNR [id number]  Alwyn Ren, MD Inpatient   09/01/2017 0535 09/08/2017 1026 Full Code [id number]  Lorretta Harp, MD ED   04/11/2017 0425 04/22/2017 0201 DNR [id number]  Pearson Grippe, MD ED   03/23/2017 1511 03/28/2017 2216 DNR [id number]  Nadara Mustard, MD Inpatient   03/14/2017 0831 03/18/2017 2212 Full Code [id number]  Ozella Rocks, MD ED   03/14/2017 0831 03/14/2017 5366  Full Code [id number]  Ozella Rocks, MD ED   01/22/2017 0857 01/25/2017 2139 Full Code [id number]  Noralee Stain Chahn-Yang, DO Inpatient   06/10/2016 0946 06/18/2016 0030 Full Code [id number]  Therisa Doyne, MD Inpatient   12/23/2015 2049 12/30/2015 1821 DNR [id number]  Casey Burkitt, MD Inpatient   09/08/2015 2156 09/17/2015 2240 Full Code [id number]  Ron Parker, MD Inpatient   07/08/2015 2124  07/10/2015 1811 Full Code [id number]  Lorretta Harp, MD ED   12/04/2014 2303 12/11/2014 2124 Full Code [id number]  Pearson Grippe, MD Inpatient   09/19/2014 1806 09/24/2014 1845 Full Code [id number]  Corrie Dandy Inpatient   02/11/2014 1557 02/14/2014 1629 Full Code [id number]  Rosalio Macadamia, NP Inpatient   09/07/2012 0131 09/10/2012 1731 Full Code [id number]  Eduard Clos, MD Inpatient       Prognosis:   Unable to determine  Discharge Planning:  To Be Determined  Care plan was discussed with patient, RN, Dr. Alvino Chapel  Thank you for allowing the Palliative Medicine Team to assist in the care of this patient.   Total Time 30 Prolonged Time Billed No      Greater than 50%  of this time was spent counseling and coordinating care related to the above assessment and plan.  Romie Minus, MD  Please contact Palliative Medicine Team phone at [phone] for questions and concerns.

## 2017-10-24 NOTE — Progress Notes (Signed)
Assumed care of this patient from previous nurse. Agree with prior assessment. Will continue to monitor patient closely.  

## 2017-10-25 ENCOUNTER — Encounter (HOSPITAL_COMMUNITY): Payer: Self-pay | Admitting: Anesthesiology

## 2017-10-25 ENCOUNTER — Encounter (HOSPITAL_COMMUNITY)
Admission: EM | Disposition: A | Discharge status: Skilled Nursing Facility | Payer: Self-pay | Source: Home / Self Care | Attending: Family Medicine

## 2017-10-25 ENCOUNTER — Inpatient Hospital Stay (HOSPITAL_COMMUNITY): Payer: Medicare Other | Admitting: Anesthesiology

## 2017-10-25 HISTORY — PX: INCISION AND DRAINAGE OF WOUND: SHX1803

## 2017-10-25 LAB — BASIC METABOLIC PANEL
ANION GAP: 8 (ref 5–15)
BUN: 9 mg/dL (ref 6–20)
CALCIUM: 7.4 mg/dL — AB (ref 8.9–10.3)
CHLORIDE: 106 mmol/L (ref 101–111)
CO2: 27 mmol/L (ref 22–32)
Creatinine, Ser: 0.8 mg/dL (ref 0.61–1.24)
GFR calc non Af Amer: >60 mL/min (ref >60)
Glucose, Bld: 161 mg/dL — ABNORMAL HIGH (ref 65–99)
Potassium: 3.3 mmol/L — ABNORMAL LOW (ref 3.5–5.1)
Sodium: 141 mmol/L (ref 135–145)

## 2017-10-25 LAB — CBC
HEMATOCRIT: 27.6 % — AB (ref 39.0–52.0)
HEMOGLOBIN: 8.2 g/dL — AB (ref 13.0–17.0)
MCH: 23.7 pg — ABNORMAL LOW (ref 26.0–34.0)
MCHC: 29.7 g/dL — ABNORMAL LOW (ref 30.0–36.0)
MCV: 79.8 fL (ref 78.0–100.0)
Platelets: 568 10*3/uL — ABNORMAL HIGH (ref 150–400)
RBC: 3.46 MIL/uL — ABNORMAL LOW (ref 4.22–5.81)
RDW: 20.5 % — AB (ref 11.5–15.5)
WBC: 19.9 10*3/uL — AB (ref 4.0–10.5)

## 2017-10-25 LAB — GLUCOSE, CAPILLARY
GLUCOSE-CAPILLARY: 129 mg/dL — AB (ref 65–99)
GLUCOSE-CAPILLARY: 85 mg/dL (ref 65–99)
GLUCOSE-CAPILLARY: 90 mg/dL (ref 65–99)
GLUCOSE-CAPILLARY: 102 mg/dL — AB (ref 65–99)
Glucose-Capillary: 80 mg/dL (ref 65–99)

## 2017-10-25 LAB — CULTURE, BLOOD (ROUTINE X 2)
Culture: NO GROWTH
SPECIAL REQUESTS: ADEQUATE

## 2017-10-25 LAB — MAGNESIUM: MAGNESIUM: 1.6 mg/dL — AB (ref 1.7–2.4)

## 2017-10-25 SURGERY — IRRIGATION AND DEBRIDEMENT WOUND
Anesthesia: General

## 2017-10-25 MED ORDER — SODIUM CHLORIDE 0.9 % IR SOLN
Status: DC | PRN
  Administered 2017-10-25: 1000 mL

## 2017-10-25 MED ORDER — MORPHINE SULFATE (PF) 4 MG/ML IV SOLN
INTRAVENOUS | Status: AC
  Filled 2017-10-25: qty 1

## 2017-10-25 MED ORDER — PROPOFOL 10 MG/ML IV BOLUS
INTRAVENOUS | Status: DC | PRN
  Administered 2017-10-25: 100 mg via INTRAVENOUS
  Administered 2017-10-25: 40 mg via INTRAVENOUS

## 2017-10-25 MED ORDER — HYDROMORPHONE 1 MG/ML IV SOLN
INTRAVENOUS | Status: DC
  Administered 2017-10-25: 4.62 mg via INTRAVENOUS
  Administered 2017-10-25: 6.8 mg via INTRAVENOUS
  Administered 2017-10-26: 3.87 mg via INTRAVENOUS
  Administered 2017-10-26: 3.41 mg via INTRAVENOUS
  Administered 2017-10-26: 4.03 mg via INTRAVENOUS
  Administered 2017-10-26: 3.48 mg via INTRAVENOUS
  Administered 2017-10-26: 2.83 mg via INTRAVENOUS
  Administered 2017-10-27: 3 mg via INTRAVENOUS
  Administered 2017-10-27: 5 mg via INTRAVENOUS
  Administered 2017-10-27: 4.13 mg via INTRAVENOUS
  Administered 2017-10-27: 4.98 mg via INTRAVENOUS
  Administered 2017-10-27: 3.32 mg via INTRAVENOUS
  Administered 2017-10-28: 4 mg via INTRAVENOUS
  Administered 2017-10-28: 09:00:00 via INTRAVENOUS
  Administered 2017-10-28: 5 mg via INTRAVENOUS
  Administered 2017-10-28: 2.5 mg via INTRAVENOUS
  Administered 2017-10-28: 3.5 mg via INTRAVENOUS
  Administered 2017-10-29: 3 mg via INTRAVENOUS
  Administered 2017-10-29: 2.5 mg via INTRAVENOUS
  Administered 2017-10-29: 0.5 mg via INTRAVENOUS
  Administered 2017-10-29: 17:00:00 via INTRAVENOUS
  Administered 2017-10-29: 4.5 mg via INTRAVENOUS
  Administered 2017-10-29 – 2017-10-30 (×2): 2 mg via INTRAVENOUS
  Administered 2017-10-30: 0 mg via INTRAVENOUS
  Administered 2017-10-30: 4.5 mg via INTRAVENOUS
  Administered 2017-10-30: 3 mg via INTRAVENOUS
  Administered 2017-10-30: 4.5 mg via INTRAVENOUS
  Administered 2017-10-30: 1.5 mg via INTRAVENOUS
  Administered 2017-10-31: 4 mg via INTRAVENOUS
  Administered 2017-10-31: 2.5 mg via INTRAVENOUS
  Administered 2017-10-31: 4 mg via INTRAVENOUS
  Administered 2017-10-31: 06:00:00 via INTRAVENOUS
  Administered 2017-10-31 – 2017-11-01 (×2): 0 mg via INTRAVENOUS
  Administered 2017-11-01: 4.5 mg via INTRAVENOUS
  Administered 2017-11-01: 1.5 mg via INTRAVENOUS
  Administered 2017-11-01: 0 mg via INTRAVENOUS
  Filled 2017-10-25 (×5): qty 25

## 2017-10-25 MED ORDER — POTASSIUM CHLORIDE 10 MEQ/100ML IV SOLN
10.0000 meq | INTRAVENOUS | Status: AC
  Administered 2017-10-25 (×4): 10 meq via INTRAVENOUS
  Filled 2017-10-25: qty 100

## 2017-10-25 MED ORDER — HYDROCORTISONE NA SUCCINATE PF 100 MG IJ SOLR: 25.0000 mg | Freq: Every day | INTRAMUSCULAR | Status: DC

## 2017-10-25 MED ORDER — ONDANSETRON HCL 4 MG/2ML IJ SOLN
INTRAMUSCULAR | Status: DC | PRN
Start: 2017-10-25 — End: 2017-10-25
  Administered 2017-10-25: 4 mg via INTRAVENOUS

## 2017-10-25 MED ORDER — MORPHINE SULFATE (PF) 4 MG/ML IV SOLN
1.0000 mg | INTRAVENOUS | Status: DC | PRN
  Administered 2017-10-25 (×4): 2 mg via INTRAVENOUS

## 2017-10-25 MED ORDER — PROMETHAZINE HCL 25 MG/ML IJ SOLN
6.2500 mg | INTRAMUSCULAR | Status: DC | PRN
Start: 2017-10-25 — End: 2017-10-25

## 2017-10-25 MED ORDER — MAGNESIUM SULFATE 2 GM/50ML IV SOLN
2.0000 g | Freq: Once | INTRAVENOUS | Status: AC
  Administered 2017-10-25: 2 g via INTRAVENOUS
  Filled 2017-10-25: qty 50

## 2017-10-25 MED ORDER — METOPROLOL TARTRATE 5 MG/5ML IV SOLN
10.0000 mg | Freq: Once | INTRAVENOUS | Status: AC
  Administered 2017-10-25: 10 mg via INTRAVENOUS
  Filled 2017-10-25: qty 10

## 2017-10-25 MED ORDER — LACTATED RINGERS IV SOLN
INTRAVENOUS | Status: AC
  Administered 2017-10-25 – 2017-10-26 (×2): via INTRAVENOUS

## 2017-10-25 MED ORDER — HYDROCORTISONE NA SUCCINATE PF 100 MG IJ SOLR
25.0000 mg | Freq: Every day | INTRAMUSCULAR | Status: AC
  Administered 2017-10-26: 25 mg via INTRAVENOUS
  Filled 2017-10-25: qty 2

## 2017-10-25 MED ORDER — PHENYLEPHRINE HCL 10 MG/ML IJ SOLN
INTRAMUSCULAR | Status: DC | PRN
  Administered 2017-10-25: 80 ug via INTRAVENOUS

## 2017-10-25 MED ORDER — LACTATED RINGERS IV SOLN
INTRAVENOUS | Status: DC
Start: 2017-10-25 — End: 2017-10-25
  Administered 2017-10-25: 1000 mL via INTRAVENOUS
  Administered 2017-10-25: 16:00:00 via INTRAVENOUS

## 2017-10-25 MED ORDER — FENTANYL CITRATE (PF) 100 MCG/2ML IJ SOLN
INTRAMUSCULAR | Status: DC | PRN
  Administered 2017-10-25 (×2): 50 ug via INTRAVENOUS

## 2017-10-25 MED ORDER — LIDOCAINE HCL (CARDIAC) 20 MG/ML IV SOLN
INTRAVENOUS | Status: DC | PRN
  Administered 2017-10-25: 60 mg via INTRAVENOUS

## 2017-10-25 MED ORDER — SUCCINYLCHOLINE CHLORIDE 20 MG/ML IJ SOLN
INTRAMUSCULAR | Status: DC | PRN
  Administered 2017-10-25: 120 mg via INTRAVENOUS

## 2017-10-25 MED ORDER — POTASSIUM CHLORIDE CRYS ER 20 MEQ PO TBCR: 40.0000 meq | EXTENDED_RELEASE_TABLET | Freq: Once | ORAL | Status: DC

## 2017-10-25 MED ORDER — FENTANYL CITRATE (PF) 100 MCG/2ML IJ SOLN
INTRAMUSCULAR | Status: AC
  Filled 2017-10-25: qty 2

## 2017-10-25 SURGICAL SUPPLY — 21 items
BLADE SURG 15 STRL LF DISP TIS (BLADE) ×1 IMPLANT
BLADE SURG 15 STRL SS (BLADE) ×2
BNDG GAUZE ELAST 4 BULKY (GAUZE/BANDAGES/DRESSINGS) ×6 IMPLANT
COVER SURGICAL LIGHT HANDLE (MISCELLANEOUS) ×3 IMPLANT
DRAPE LAPAROSCOPIC ABDOMINAL (DRAPES) ×3 IMPLANT
ELECT PENCIL ROCKER SW 15FT (MISCELLANEOUS) ×3 IMPLANT
ELECT REM PT RETURN 15FT ADLT (MISCELLANEOUS) ×3 IMPLANT
GLOVE BIOGEL PI IND STRL 8 (GLOVE) ×1 IMPLANT
GLOVE BIOGEL PI INDICATOR 8 (GLOVE) ×2
GLOVE SURG SS PI 7.5 STRL IVOR (GLOVE) ×3 IMPLANT
GOWN STRL REUS W/TWL LRG LVL3 (GOWN DISPOSABLE) ×3 IMPLANT
GOWN STRL REUS W/TWL XL LVL3 (GOWN DISPOSABLE) ×3 IMPLANT
KIT BASIN OR (CUSTOM PROCEDURE TRAY) ×3 IMPLANT
NS IRRIG 1000ML POUR BTL (IV SOLUTION) ×3 IMPLANT
PACK GENERAL/GYN (CUSTOM PROCEDURE TRAY) ×3 IMPLANT
PAD ABD 8X10 STRL (GAUZE/BANDAGES/DRESSINGS) ×6 IMPLANT
SPONGE LAP 18X18 X RAY DECT (DISPOSABLE) ×3 IMPLANT
SYR BULB IRRIGATION 50ML (SYRINGE) ×3 IMPLANT
TAPE CLOTH SURG 6X10 WHT LF (GAUZE/BANDAGES/DRESSINGS) ×3 IMPLANT
TOWEL OR 17X26 10 PK STRL BLUE (TOWEL DISPOSABLE) ×3 IMPLANT
YANKAUER SUCT BULB TIP NO VENT (SUCTIONS) ×3 IMPLANT

## 2017-10-25 NOTE — Progress Notes (Signed)
Patient refused to be turned at all during night shift. Explained importance of repositioning. Will continue to monitor patient.

## 2017-10-25 NOTE — Consult Note (Signed)
H&P Physician requesting consult: Glenna Fellows  Chief Complaint: Concern for scrotal abscess  History of Present Illness: 67 year old male with multiple medical comorbidities including CHF, diabetes with poor control, atrial fibrillation on Xarelto, chronic bedbound with sacral decubitus ulcer who was noted to have drainage from the scrotum yesterday with turning.  He is status post scrotal abscess incision and drainage on 12/27/2015 by Dr. Hadley Pen.  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  There was gas within the posterior aspect of the left side of the scrotum with fistula communication to the skin.  The patient's white blood cell count has been increasing.  General surgery plans to take him to the operating room for wound debridement of the sacral decubitus ulcer for further investigation.  Past Medical History:  Diagnosis Date  . Arthritis    "hands and lower back" (09/19/2014)  . CAD (coronary artery disease)    a. s/p PCI to RCA in 2012. b. prior cath in 01/2014 with elevated L/RH pressures, mild-mod CAD of LAD/LCx with patent RCA, normal EF, c/b CIN/CHF.  [name] Carpal tunnel syndrome, bilateral   . Cellulitis   . Chronic diastolic CHF (congestive heart failure) (HCC)   . Chronic kidney disease (CKD), stage III (moderate) (HCC)   . Chronic lower back pain   . Diabetes (HCC)   . DKA (diabetic ketoacidoses) (HCC) 03/2017  . History of blood transfusion ~ 1954   "related to OR"  . Hyperlipidemia   . Hypertension   . Hypoxia    a. Qualified for home O2 at [place] in 09/2014.  [name] Lower GI bleed   . Microcytic anemia   . Morbid obesity (HCC)   . Neuropathy   . OSA (obstructive sleep apnea)    "I wear nasal prongs; haven't been using prongs recently" (09/19/2014)  . PAF (paroxysmal atrial fibrillation) (HCC)    TEE DCCV 09/23/2014  . Physical deconditioning   . Pilonidal cyst 1980's; 01/25/2013  . Scrotal  abscess   . Type II diabetes mellitus (HCC)    Past Surgical History:  Procedure Laterality Date  . ABDOMINAL SURGERY  ~ 1954   BENIGN TUMOR REMOVED  . AMPUTATION Right 03/23/2017   Procedure: RIGHT BELOW KNEE AMPUTATION;  Surgeon: Nadara Mustard, MD;  Location: [facility] OR;  Service: Orthopedics;  Laterality: Right;  . APPENDECTOMY    . BELOW KNEE LEG AMPUTATION Right 03/23/2017  . CARDIOVERSION  2010   [name] 09/19/2014  . CARDIOVERSION N/A 09/23/2014   Procedure: CARDIOVERSION;  Surgeon: Lewayne Bunting, MD;  Location: [facility] ENDOSCOPY;  Service: Cardiovascular;  Laterality: N/A;  . CARDIOVERSION N/A 12/11/2014   Procedure: CARDIOVERSION;  Surgeon: Thurmon Fair, MD;  Location: [facility] ENDOSCOPY;  Service: Cardiovascular;  Laterality: N/A;  . CATARACT EXTRACTION W/PHACO Right 11/15/2012   Procedure: CATARACT EXTRACTION PHACO AND INTRAOCULAR LENS PLACEMENT (IOC);  Surgeon: Shade Flood, MD;  Location: [facility] OR;  Service: Ophthalmology;  Laterality: Right;  . CATARACT EXTRACTION W/PHACO Left 11/29/2012   Procedure: CATARACT EXTRACTION PHACO AND INTRAOCULAR LENS PLACEMENT (IOC);  Surgeon: Shade Flood, MD;  Location: [facility] OR;  Service: Ophthalmology;  Laterality: Left;  . CORONARY ANGIOPLASTY WITH STENT PLACEMENT  August 2012   RCA DES - [facility]  . DEBRIDEMENT  FOOT Left    debriding diabetic foot ulcers  . EYE SURGERY    . FOREIGN BODY REMOVAL Right 2014   heel,  splinter removal   . LEFT AND  RIGHT HEART CATHETERIZATION WITH CORONARY ANGIOGRAM N/A 01/31/2014   Procedure: LEFT AND RIGHT HEART CATHETERIZATION WITH CORONARY ANGIOGRAM;  Surgeon: Micheline Chapman, MD;  Location: [facility] CATH LAB;  Service: Cardiovascular;  Laterality: N/A;  . PARS PLANA VITRECTOMY Left 06/05/2013   Procedure: PARS PLANA VITRECTOMY WITH 23 GAUGE with Endolaser(constellation);  Surgeon: Shade Flood, MD;  Location: [facility] OR;  Service: Ophthalmology;  Laterality: Left;  . PILONIDAL CYST EXCISION N/A 01/08/2013    Procedure: CYST EXCISION PILONIDAL EXTENSIVE;  Surgeon: Axel Filler, MD;  Location: [facility] OR;  Service: General;  Laterality: N/A;  . PILONIDAL CYST EXCISION  1980's   "in [place]"  . TEE WITHOUT CARDIOVERSION N/A 09/23/2014   Procedure: TRANSESOPHAGEAL ECHOCARDIOGRAM (TEE);  Surgeon: Lewayne Bunting, MD;  Location: [facility] ENDOSCOPY;  Service: Cardiovascular;  Laterality: N/A;  . TONSILLECTOMY      Home Medications:  Medications Prior to Admission  Medication Sig Dispense Refill Last Dose  . acetaminophen (TYLENOL) 325 MG tablet Take 2 tablets (650 mg total) by mouth every 4 (four) hours as needed for mild pain (temp > 101.5). (Patient taking differently: Take 650 mg by mouth every 4 (four) hours as needed for fever (temp > 101.5). ) 30 tablet 1 unknown  . acetaminophen (TYLENOL) 500 MG tablet Take 500 mg by mouth every 6 (six) hours as needed for mild pain.    unknown  . albuterol (PROVENTIL) (2.5 MG/3ML) 0.083% nebulizer solution Take 3 mLs (2.5 mg total) by nebulization every 4 (four) hours as needed for wheezing or shortness of breath. 75 mL 12 unknown  . aluminum-magnesium hydroxide-simethicone (MAALOX) 200-200-20 MG/5ML SUSP Take 30 mLs by mouth every 4 (four) hours as needed (heartburn).   unknown  . Amino Acids-Protein Hydrolys (FEEDING SUPPLEMENT, PRO-STAT SUGAR FREE 64,) LIQD Take 30 mLs by mouth 3 (three) times daily with meals. 900 mL 0 10/20/2017 at Unknown time  . aspirin EC 81 MG EC tablet Take 1 tablet (81 mg total) by mouth daily. 30 tablet 2 10/20/2017 at Unknown time  . atorvastatin (LIPITOR) 80 MG tablet Take 1 tablet (80 mg total) by mouth daily. 30 tablet 10 10/20/2017 at Unknown time  . calcium carbonate (TUMS - DOSED IN MG ELEMENTAL CALCIUM) 500 MG chewable tablet Chew 2 tablets (400 mg of elemental calcium total) by mouth 2 (two) times daily as needed for indigestion or heartburn. 60 tablet 1 unknown at Unknown time  . calcium-vitamin D (OSCAL WITH D) 500-200 MG-UNIT tablet  Take 1 tablet by mouth daily.   10/20/2017 at Unknown time  . collagenase (SANTYL) ointment Apply 1 application topically daily.   10/20/2017 at Unknown time  . diphenhydrAMINE (BENADRYL) 25 MG tablet Take 25 mg by mouth every 4 (four) hours as needed for itching.   Past Week at Unknown time  . famotidine (PEPCID) 20 MG tablet Take 1 tablet (20 mg total) by mouth at bedtime. 30 tablet 2 10/19/2017 at Unknown time  . feeding supplement, GLUCERNA SHAKE, (GLUCERNA SHAKE) LIQD Take 237 mLs by mouth 3 (three) times daily between meals. 30 Can 0 10/20/2017 at Unknown time  . ferrous sulfate 325 (65 FE) MG tablet Take 1 tablet (325 mg total) by mouth 2 (two) times daily with a meal. 30 tablet 0 10/20/2017 at Unknown time  . furosemide (LASIX) 20 MG tablet Take 1 tablet (20 mg total) by mouth daily. 30 tablet 1 10/20/2017 at Unknown time  . gabapentin (NEURONTIN) 300 MG capsule Take 2 capsules (600 mg total) by mouth 2 (  two) times daily. 60 capsule 3 10/20/2017 at Unknown time  . hydrocerin (EUCERIN) CREA Apply 1 application topically 3 (three) times daily. legs 454 g 3 10/20/2017 at Unknown time  . insulin aspart (NOVOLOG FLEXPEN) 100 UNIT/ML FlexPen Inject 2-10 Units into the skin 4 (four) times daily -  with meals and at bedtime. Per sliding scale.. 150-200=2 units 201-250=4 units 251-300=6 units 301-350=8 units 351-400=10 units Over 400 call MD   10/20/2017 at Unknown time  . insulin detemir (LEVEMIR) 100 UNIT/ML injection Inject 0.1 mLs (10 Units total) into the skin daily. (Patient taking differently: Inject 10-14 Units into the skin 2 (two) times daily. 10 units qam at 0900, 14 units qhs) 10 mL 11 10/20/2017 at Unknown time  . loperamide (IMODIUM) 2 MG capsule Take 1 capsule (2 mg total) by mouth every 6 (six) hours as needed for diarrhea or loose stools. 30 capsule 0 unknown  . magnesium oxide (MAG-OX) 400 (241.3 Mg) MG tablet Take 1 tablet (400 mg total) by mouth 2 (two) times daily. 60 tablet 1 10/20/2017  at Unknown time  . metoprolol tartrate (LOPRESSOR) 25 MG tablet Take 1 tablet (25 mg total) by mouth 2 (two) times daily. 60 tablet 2 10/20/2017 at 0900  . Multiple Vitamin (MULTIVITAMIN WITH MINERALS) TABS Take 1 tablet by mouth daily.   10/19/2017 at Unknown time  . ondansetron (ZOFRAN) 4 MG tablet Take 4 mg by mouth every 8 (eight) hours as needed for nausea or vomiting.   unknown  . Oxycodone HCl 20 MG TABS Take 1 tablet (20 mg total) by mouth every 4 (four) hours as needed (pain). 20 tablet 0 10/20/2017 at Unknown time  . OXYGEN Inhale 2-3 L into the lungs continuous.   10/20/2017 at Unknown time  . potassium & sodium phosphates (PHOS-NAK) 280-160-250 MG PACK Take 1 packet by mouth 4 (four) times daily -  with meals and at bedtime. 120 packet 0 10/20/2017 at Unknown time  . vitamin C (VITAMIN C) 250 MG tablet Take 1 tablet (250 mg total) by mouth 2 (two) times daily. 60 tablet 1 10/20/2017 at Unknown time  . XARELTO 20 MG TABS tablet TAKE 1 TABLET BY MOUTH EVERY DAY WITH SUPPER 30 tablet 0 10/19/2017 at 1700  . lidocaine (LMX) 4 % cream Apply topically daily as needed (during hydro). 30 g 0 unknown  . protein supplement shake (PREMIER PROTEIN) LIQD Take 325 mLs (11 oz total) by mouth daily. 30 Can 1    Allergies: No Known Allergies  Family History  Adopted: Yes  Problem Relation Age of Onset  . Other Other        PT ADOPTED   Social History:  reports that he quit smoking about 34 years ago. His smoking use included cigarettes. He has a 20.00 pack-year smoking history. He has never used smokeless tobacco. He reports that he does not drink alcohol or use drugs.  ROS: A complete review of systems was performed.  All systems are negative except for pertinent findings as noted. ROS   Physical Exam:  Vital signs in last 24 hours: Temp:  [97.7 F (36.5 C)-98.1 F (36.7 C)] 98.1 F (36.7 C) (04/16 0553) Pulse Rate:  [88-114] 114 (04/16 0553) Resp:  [14-22] 15 (04/16 0809) BP:  (110-123)/(66-71) 123/67 (04/16 0553) SpO2:  [91 %-100 %] 96 % (04/16 0809) Weight:  [109.6 kg (241 lb 10 oz)] 109.6 kg (241 lb 10 oz) (04/16 0553) General:  Alert and oriented, No acute distress HEENT: Normocephalic, atraumatic  Cardiovascular: Regular rate and rhythm Lungs: Regular rate and effort Abdomen: Soft, nontender, nondistended, no abdominal masses, morbidly obese Extremities: No edema Neurologic: He has had an amputation. Genitourinary: Penis is retracted with Foley catheter in place.  Scrotum is edematous with no sign of surrounding erythema.  There is no fluctuance within the scrotum.  Bilateral testicles are not easily palpable secondary to hydroceles.  There is no tenderness to palpation of the scrotum.  On my exam, I did not see any obvious drainage however the drainage which is reportedly easier to see when he is prone.  There is no obvious superficial infection.  No obvious abscess that I can see to drain.  Laboratory Data:  Results for orders placed or performed during the hospital encounter of 10/20/17 (from the past 24 hour(s))  Glucose, capillary     Status: Abnormal   Collection Time: 10/24/17 12:09 PM  Result Value Ref Range   Glucose-Capillary 254 (H) 65 - 99 mg/dL  Glucose, capillary     Status: Abnormal   Collection Time: 10/24/17  5:00 PM  Result Value Ref Range   Glucose-Capillary 159 (H) 65 - 99 mg/dL  Glucose, capillary     Status: None   Collection Time: 10/24/17 10:15 PM  Result Value Ref Range   Glucose-Capillary 96 65 - 99 mg/dL  CBC     Status: Abnormal   Collection Time: 10/25/17  5:16 AM  Result Value Ref Range   WBC 19.9 (H) 4.0 - 10.5 K/uL   RBC 3.46 (L) 4.22 - 5.81 MIL/uL   Hemoglobin 8.2 (L) 13.0 - 17.0 g/dL   HCT 52.7 (L) 78.2 - 42.3 %   MCV 79.8 78.0 - 100.0 fL   MCH 23.7 (L) 26.0 - 34.0 pg   MCHC 29.7 (L) 30.0 - 36.0 g/dL   RDW 53.6 (H) 14.4 - 31.5 %   Platelets 568 (H) 150 - 400 K/uL  Basic metabolic panel     Status: Abnormal    Collection Time: 10/25/17  5:16 AM  Result Value Ref Range   Sodium 141 135 - 145 mmol/L   Potassium 3.3 (L) 3.5 - 5.1 mmol/L   Chloride 106 101 - 111 mmol/L   CO2 27 22 - 32 mmol/L   Glucose, Bld 161 (H) 65 - 99 mg/dL   BUN 9 6 - 20 mg/dL   Creatinine, Ser 4.00 0.61 - 1.24 mg/dL   Calcium 7.4 (L) 8.9 - 10.3 mg/dL   GFR calc non Af Amer >60 >60 mL/min   GFR calc Af Amer >60 >60 mL/min   Anion gap 8 5 - 15  Magnesium     Status: Abnormal   Collection Time: 10/25/17  5:16 AM  Result Value Ref Range   Magnesium 1.6 (L) 1.7 - 2.4 mg/dL  Glucose, capillary     Status: Abnormal   Collection Time: 10/25/17  7:58 AM  Result Value Ref Range   Glucose-Capillary 129 (H) 65 - 99 mg/dL   Recent Results (from the past 240 hour(s))  Culture, blood (Routine x 2)     Status: None (Preliminary result)   Collection Time: 10/20/17  4:30 PM  Result Value Ref Range Status   Specimen Description   Final    BLOOD LEFT ANTECUBITAL Performed at [facility], [address], [place], [place] [address]    Special Requests   Final    BOTTLES DRAWN AEROBIC AND ANAEROBIC Blood Culture adequate volume Performed at [facility], [address], [place], [place] [address]    Culture   Final    NO GROWTH 4 DAYS Performed at [facility], [address], [place], [place] [address]    Report Status PENDING  Incomplete  Urine culture     Status: Abnormal   Collection Time: 10/20/17  4:58 PM  Result Value Ref Range Status   Specimen Description   Final    URINE, RANDOM Performed at [facility], [address], [place], [place] [address]    Special Requests   Final    NONE Performed at [facility], [address], [place], [place] [address]    Culture (A)  Final    >=100,000 COLONIES/mL ACINETOBACTER CALCOACETICUS/BAUMANNII COMPLEX   Report Status 10/24/2017 FINAL  Final   Organism ID, Bacteria ACINETOBACTER  CALCOACETICUS/BAUMANNII COMPLEX (A)  Final      Susceptibility   Acinetobacter calcoaceticus/baumannii complex - MIC*    CEFTAZIDIME >=64 RESISTANT Resistant     CEFTRIAXONE 32 INTERMEDIATE Intermediate     CIPROFLOXACIN >=4 RESISTANT Resistant     GENTAMICIN 8 INTERMEDIATE Intermediate     IMIPENEM >=16 RESISTANT Resistant     PIP/TAZO >=128 RESISTANT Resistant     TRIMETH/SULFA >=320 RESISTANT Resistant     CEFEPIME 32 RESISTANT Resistant     AMPICILLIN/SULBACTAM RESISTANT Resistant     * >=100,000 COLONIES/mL ACINETOBACTER CALCOACETICUS/BAUMANNII COMPLEX  MRSA PCR Screening     Status: Abnormal   Collection Time: 10/21/17  1:12 AM  Result Value Ref Range Status   MRSA by PCR POSITIVE (A) NEGATIVE Final    Comment:        The GeneXpert MRSA Assay (FDA approved for NASAL specimens only), is one component of a comprehensive MRSA colonization surveillance program. It is not intended to diagnose MRSA infection nor to guide or monitor treatment for MRSA infections. RESULT CALLED TO, READ BACK BY AND VERIFIED WITH: Ranell Patrick @0525  10/21/17 MKELLY Performed at [facility], 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  I will be available to take a look in the operating room.  Ray Church, III 10/25/2017, 8:34 AM

## 2017-10-25 NOTE — Progress Notes (Signed)
Daily Progress Note   Patient Name: [name]       Date: 10/25/2017 DOB: 23-Dec-1950  Age: 67 y.o. MRN#: [id number] Attending Physician: Noralee Stain, DO Primary Care Physician: [name], MD (Inactive) Admit Date: 10/20/2017  Reason for Consultation/Follow-up: Establishing goals of care and Pain control  Subjective: Awake alert  Resting in bed Complains of being in pain in his scrotal area as well as in the back.  He states he is uncomfortable, is thankful for being given ice chips. PCA reviewed and will be adjusted PPS 30% See below.  Length of Stay: 5  Current Medications: Scheduled Meds:  . atorvastatin  80 mg Oral q1800  . calcium-vitamin D  1 tablet Oral Daily  . Chlorhexidine Gluconate Cloth  6 each Topical Q0600  . feeding supplement (GLUCERNA SHAKE)  237 mL Oral TID BM  . feeding supplement (PRO-STAT SUGAR FREE 64)  30 mL Oral TID WC  . gabapentin  600 mg Oral BID  . hydrocortisone sod succinate (SOLU-CORTEF) inj  50 mg Intravenous Daily  . HYDROmorphone   Intravenous Q4H  . insulin aspart  0-9 Units Subcutaneous TID WC  . insulin aspart  3 Units Subcutaneous TID WC  . insulin detemir  10 Units Subcutaneous BID  . multivitamin with minerals  1 tablet Oral Daily  . mupirocin ointment  1 application Nasal BID  . pantoprazole  40 mg Oral Daily  . sodium chloride flush  3 mL Intravenous Q12H    Continuous Infusions: . ampicillin-sulbactam (UNASYN) IV Stopped (10/25/17 5361)  . magnesium sulfate 1 - 4 g bolus IVPB    . potassium chloride      PRN Meds: acetaminophen **OR** acetaminophen, albuterol, diphenhydrAMINE **OR** diphenhydrAMINE, HYDROmorphone (DILAUDID) injection, naloxone **AND** sodium chloride flush, ondansetron **OR** ondansetron (ZOFRAN)  IV, polyvinyl alcohol, white petrolatum  Physical Exam        .  Obese, pale in appearance  Cardiovascular: Normal rate. An irregularly irregular rhythm present.  Pulmonary/Chest: No accessory muscle usage. No tachypnea. No respiratory distress. He has decreased breath sounds.  regular breaths  Abdominal: Soft. Normal appearance.   Vital Signs: BP 123/67 (BP Location: Right Arm)   Pulse (!) 114   Temp 98.1 F (36.7 C) (Oral)   Resp 15   Ht 6' (1.829 m)  Wt 109.6 kg (241 lb 10 oz)   SpO2 96%   BMI 32.77 kg/m  SpO2: SpO2: 96 % O2 Device: O2 Device: Nasal Cannula O2 Flow Rate: O2 Flow Rate (L/min): 3 L/min  Intake/output summary:   Intake/Output Summary (Last 24 hours) at 10/25/2017 0881 Last data filed at 10/25/2017 0604 Gross per 24 hour  Intake 1210 ml  Output 1300 ml  Net -90 ml   LBM: Last BM Date: 10/24/17 Baseline Weight: Weight: 121.6 kg (268 lb) Most recent weight: Weight: 109.6 kg (241 lb 10 oz)       Palliative Assessment/Data:    Flowsheet Rows     Most Recent Value  Intake Tab  Referral Department  Hospitalist  Unit at Time of Referral  Med/Surg Unit  Palliative Care Primary Diagnosis  Sepsis/Infectious Disease  Date Notified  10/21/17  Palliative Care Type  Return patient Palliative Care  Reason for referral  Clarify Goals of Care, Pain  Date of Admission  10/20/17  Date first seen by Palliative Care  10/22/17  # of days Palliative referral response time  1 Day(s)  # of days IP prior to Palliative referral  1  Clinical Assessment  Palliative Performance Scale Score  50%  Pain Max last 24 hours  9  Pain Min Last 24 hours  3  Psychosocial & Spiritual Assessment  Palliative Care Outcomes      Patient Active Problem List   Diagnosis Date Noted  . Protein-calorie malnutrition, severe (HCC) 10/20/2017  . Sepsis (HCC) 10/20/2017  . Occult blood in stools 10/20/2017  . C. difficile diarrhea 10/12/2017  . Hypomagnesemia 10/12/2017  . Septic shock  (HCC) 09/29/2017  . Acute encephalopathy   . Palliative care encounter   . UTI (urinary tract infection) 09/01/2017  . Acute metabolic encephalopathy 09/01/2017  . S/P AKA (above knee amputation) unilateral, right (HCC) 09/01/2017  . Atrial fibrillation with rapid ventricular response (HCC)   . Hyponatremia 04/11/2017  . ARF (acute renal failure) (HCC) 04/11/2017  . Pressure injury of skin 03/24/2017  . Below knee amputation status, right (HCC) 03/23/2017  . DKA (diabetic ketoacidoses) (HCC) 03/14/2017  . Elevated troponin 03/14/2017  . Diabetic polyneuropathy associated with type 2 diabetes mellitus (HCC)   . Osteoarthritis of both knees   . Diabetic nephropathy associated with type 2 diabetes mellitus (HCC)   . Bilateral lower leg cellulitis 01/22/2017  . Venous stasis 08/06/2016  . At high risk for falls 07/21/2016  . CHF exacerbation (HCC) 06/10/2016  . Lactic acidosis 06/09/2016  . Syncope and collapse 03/05/2016  . Acute on chronic renal insufficiency 03/05/2016  . Obesity 12/30/2015  . Scrotal swelling   . Foot fracture   . 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 Lower GI bleed   . Pressure ulcer 12/24/2015  . Acute kidney injury (HCC) 12/23/2015  . Back pain 12/23/2015  . Fall   . Metatarsal fracture   . Traumatic rhabdomyolysis (HCC)   . Hyperkalemia   . Lisfranc dislocation   . Hyperglycemia   . Sepsis due to skin infection (HCC) 09/08/2015  . Scrotal wall abscess 09/08/2015  . AKI (acute kidney injury) (HCC) 09/08/2015  . Chest wall pain   . On amiodarone therapy 12/08/2014  . Respiratory failure with hypoxia (HCC) 12/04/2014  . Hypoxia 12/04/2014  . Acute on chronic respiratory failure with hypoxia (HCC)   . Atrial fibrillation with RVR (HCC) 09/20/2014  . Chronic diastolic CHF (congestive heart failure) (HCC) 09/19/2014  .  Acute respiratory failure (HCC) 09/19/2014  . Abnormal nuclear cardiac imaging test 02/12/2014  . Dyspnea 02/12/2014    . Chronic anticoagulation- Coumadin 02/12/2014  . CKD (chronic kidney disease) stage 3, GFR 30-59 ml/min (HCC) 02/12/2014  . Chronic diastolic congestive heart failure (HCC) 02/11/2014  . Encounter for therapeutic drug monitoring 08/03/2013  . Pilonidal cyst 01/25/2013  . Perirectal cellulitis 09/06/2012  . Long term (current) use of anticoagulants 11/18/2011  . Obstructive sleep apnea- unable to tolerate c-pap 08/04/2011  . CAD S/P percutaneous coronary angioplasty 08/04/2011  . ANEMIA-IRON DEFICIENCY 01/06/2009  . OTHER AND UNSPECIFIED COAGULATION DEFECTS 01/06/2009  . Anemia 01/02/2009  . CARDIOVASCULAR FUNCTION STUDY, ABNORMAL 01/02/2009  . Essential hypertension, benign 10/22/2008  . Atrial fibrillation 10/22/2008  . Hyperlipidemia 09/23/2008  . Hypokalemia 09/23/2008  . Obesity, Class III, BMI 40-49.9 (morbid obesity) (HCC) 09/23/2008  . CELLULITIS AND ABSCESS OF LEG EXCEPT FOOT 09/23/2008    Palliative Care Assessment & Plan   Patient Profile: 67 y.o. male  with past medical history of hypertension, hyperlipidemia, diabetes mellitus, atrial fibrillation onXarelto, OSA not on CPAP, GI bleeding,dCHF,COPD, CKD3,s/p of right AKA admitted on 10/20/2017 with sepsis, a fib, and concern for GI bleeding.  This is his 3rd admission in 6 months and palliative was involved in care during recent admission with discharge on 4/5.  Palliative consulted for goals of care and assistance with pain management.  Recommendations/Plan: -Pain: Pain better controlled with initiation of PCA, but still with uncontrolled pain at rest and especially with any movement.       Plan to incrase dilaudid PCA with 0.5 mg basal and 0.5 mg bolus with 15 minute lockout.    Patient to go to the OR later today for possible debridement with surgery. Urology input also noted and appreciated.     Would continue PCA until after this procedure.  I have discussed risk vs benefit of opioids and he reports pain control  is most important thing to him.  He understands inherent risk and is agreeable to palliative PCA.   -Goals of care: Continue discussions after procedure today as per patient's wishes.    Code Status:    Code Status Orders  (From admission, onward)        Start     Ordered   10/20/17 1953  Do not attempt resuscitation (DNR)  Continuous    Question Answer Comment  In the event of cardiac or respiratory ARREST Do not call a "code blue"   In the event of cardiac or respiratory ARREST Do not perform Intubation, CPR, defibrillation or ACLS   In the event of cardiac or respiratory ARREST Use medication by any route, position, wound care, and other measures to relive pain and suffering. May use oxygen, suction and manual treatment of airway obstruction as needed for comfort.      10/20/17 1956    Code Status History    Date Active Date Inactive Code Status Order ID Comments User Context   09/29/2017 0457 10/15/2017 0232 Full Code [id number]  Erin Fulling, MD ED   09/08/2017 1026 09/13/2017 2008 DNR [id number]  Alwyn Ren, MD Inpatient   09/01/2017 0535 09/08/2017 1026 Full Code [id number]  Lorretta Harp, MD ED   04/11/2017 0425 04/22/2017 0201 DNR [id number]  Pearson Grippe, MD ED   03/23/2017 1511 03/28/2017 2216 DNR [id number]  Nadara Mustard, MD Inpatient   03/14/2017 0831 03/18/2017 2212 Full Code [id number]  Ozella Rocks, MD ED   03/14/2017 0831 03/14/2017 1194  Full Code [id number]  Ozella Rocks, MD ED   01/22/2017 0857 01/25/2017 2139 Full Code [id number]  Noralee Stain Chahn-Yang, DO Inpatient   06/10/2016 0946 06/18/2016 0030 Full Code [id number]  Therisa Doyne, MD Inpatient   12/23/2015 2049 12/30/2015 1821 DNR [id number]  Casey Burkitt, MD Inpatient   09/08/2015 2156 09/17/2015 2240 Full Code [id number]  Ron Parker, MD Inpatient   07/08/2015 2124 07/10/2015 1811 Full Code [id number]  Lorretta Harp, MD ED   12/04/2014 2303 12/11/2014 2124 Full Code [id number]  Pearson Grippe, MD Inpatient    09/19/2014 1806 09/24/2014 1845 Full Code [id number]  Corrie Dandy Inpatient   02/11/2014 1557 02/14/2014 1629 Full Code [id number]  Rosalio Macadamia, NP Inpatient   09/07/2012 0131 09/10/2012 1731 Full Code [id number]  Eduard Clos, MD Inpatient       Prognosis:   Unable to determine  Discharge Planning:  To Be Determined  Care plan was discussed with patient, RN,    Thank you for allowing the Palliative Medicine Team to assist in the care of this patient.   Total Time 35 Prolonged Time Billed No      Greater than 50%  of this time was spent counseling and coordinating care related to the above assessment and plan.  Rosalin Hawking, MD  Please contact Palliative Medicine Team phone at [phone] for questions and concerns.

## 2017-10-25 NOTE — Transfer of Care (Signed)
Immediate Anesthesia Transfer of Care Note  Patient: [name]  Procedure(s) Performed: DEBRIDEMENT SACRAL DECUBITUS skin, subcutaneous tissue and fascia. Incision drainage and debridement skin and subcutaneous tissue scrotum. (N/A )  Patient Location: PACU  Anesthesia Type:General  Level of Consciousness: awake, alert  and oriented  Airway & Oxygen Therapy: Patient Spontanous Breathing and Patient connected to face mask oxygen  Post-op Assessment: Report given to RN and Post -op Vital signs reviewed and stable  Post vital signs: Reviewed and stable  Last Vitals:  Vitals Value Taken Time  BP 130/85 10/25/2017  4:45 PM  Temp    Pulse 121 10/25/2017  4:48 PM  Resp 15 10/25/2017  4:48 PM  SpO2 100 % 10/25/2017  4:48 PM  Vitals shown include unvalidated device data.  Last Pain:  Vitals:   10/25/17 0809  TempSrc:   PainSc: 9       Patients Stated Pain Goal: 3 (10/24/17 1720)  Complications: No apparent anesthesia complications

## 2017-10-25 NOTE — Op Note (Signed)
Preoperative Diagnosis: #1 sacral decubitus ulcer #2 chronic scrotal abscess with fistula  Postoprative Diagnosis: Same    Procedure(s): DEBRIDEMENT SACRAL DECUBITUS skin, subcutaneous tissue and fascia. Incision drainage and debridement skin and subcutaneous tissue scrotum.   Surgeon: Glenna Fellows T   Assistants: Mattie Marlin PA  Anesthesia:  General endotracheal anesthesia  Indications: Patient is an unfortunate 67 year old male with multiple chronic medical problems including diabetes and previous right above-the-knee amputation.  He was discharged to a nursing home following a recent extensive hospitalization with sepsis and respiratory failure.  He now presents back to the hospital with a large decubitus ulcer at the sacrum with necrotic tissue.  In addition he has an area of purulent drainage and some fluctuance from the base of his scrotum.  CT scan has shown possible fistulous from the sacral decubitus toward the scrotum and possible fistula from the scrotal abscess toward the anus.  In addition he is morbidly obese.  I recommended proceeding with exam under anesthesia in the operating room with incision drainage and debridement of these areas as needed based on thorough evaluation in the operating room.  I discussed this procedure and its indications with the patient as well as risks of anesthetic complications, bleeding, infection wound healing problems.  He understands and agrees to proceed.    Procedure Detail: Patient was brought to the operating room placed in the supine position on the beanbag on the operating table and general endotracheal anesthesia induced.  He was then carefully positioned in left lateral decubitus position rotated slightly toward prone.  He was carefully padded and positioned with a beanbag and foam padding.  The entire sacral and perineal area and proximal posterior thighs were widely sterilely prepped and draped.  He was already on broad-spectrum IV  antibiotics.  Patient timeout was performed and correct procedures identified.  Attention initially was turned toward the scrotal abscess.  Dr. Alvester Morin scrubbed in to the operating room from urology as consultation.  There was noted to be an approximately 4 cm deep subcutaneous chronic abscess in the posterior base of the scrotum.  There was no obvious fistula that went straight posterior from this and exited in the perianal area in the posterior midline but did not really enter the anus or rectum and was purely and subcutaneous tissue.  Initially the abscess cavity was unroofed excising an approximately 3-4 cm area of skin and chronically infected subcutaneous tissue overlying the abscess cavity.  The fistula was then identified straight posteriorly to the posterior perineum near the anus and this was opened along its entire length.  Following this a separate external fistula opening was noted on the posterior right hemiscrotum extending down into the chronic scrotal abscess and this was also opened up along its length and some further skin and subcutaneous tissue debrided.  Hemostasis was obtained with cautery and this wound was temporarily packed with moist lap pad.  Attention was then turned to the sacral decubitus.  This was stage III with approximately 50% of the area necrotic tissue with the ulcer measuring 12 x 15 cm.  A total area of necrotic tissue in the ulcer of 6 by 7 cm x 1-2 cm deep was debrided with sharp scissor and knife dissection as well as cautery dissection.  Necrotic skin subcutaneous tissue and fascia was debrided.  This extended down to the periosteum of the coccyx and sacrum but did not obviously involve bone.  The debridement was carried to bleeding apparently viable tissue in all directions.  Hemostasis  was obtained with cautery.  Both wounds were packed with moist saline gauze and dry sterile dressings.  Sponge needle and instrument counts were correct.   Findings: #1 chronic scrotal  abscess with fistulas to perineal skin Sacral decubitus with necrotic skin, subcutaneous tissue and fascia  Estimated Blood Loss:  less than 50 mL         Drains: None  Blood Given: none          Specimens: Skin, subcutaneous tissue and fascia from sacral decubitus and skin and subtendinous tissue from scrotal abscess.        Complications:  * No complications entered in OR log *         Disposition: PACU - hemodynamically stable.         Condition: stable

## 2017-10-25 NOTE — Anesthesia Procedure Notes (Signed)
Procedure Name: Intubation Date/Time: 10/25/2017 3:09 PM Performed by: Thornell Mule, CRNA Pre-anesthesia Checklist: Patient identified, Emergency Drugs available, Suction available and Patient being monitored Patient Re-evaluated:Patient Re-evaluated prior to induction Oxygen Delivery Method: Circle system utilized Preoxygenation: Pre-oxygenation with 100% oxygen Induction Type: IV induction Ventilation: Mask ventilation without difficulty Laryngoscope Size: Miller and 3 Grade View: Grade I Tube type: Oral Tube size: 7.5 mm Number of attempts: 1 Airway Equipment and Method: Stylet and Oral airway Placement Confirmation: ETT inserted through vocal cords under direct vision,  positive ETCO2 and breath sounds checked- equal and bilateral Tube secured with: Tape Dental Injury: Teeth and Oropharynx as per pre-operative assessment

## 2017-10-25 NOTE — Progress Notes (Signed)
Patien'ts heart rate sustaining in the 140's.  Dr. Alvino Chapel notified, with orders received for iv lopressor.

## 2017-10-25 NOTE — Progress Notes (Signed)
Day of Surgery    CC: Decubitus  Subjective: No real change he is fairly uncomfortable most of the time.  Very anxious over being n.p.o. with dry mouth and just basically uncomfortable.  Objective: Vital signs in last 24 hours: Temp:  [97.7 F (36.5 C)-98.1 F (36.7 C)] 98.1 F (36.7 C) (04/16 0553) Pulse Rate:  [88-114] 114 (04/16 0553) Resp:  [14-22] 15 (04/16 0809) BP: (110-123)/(66-71) 123/67 (04/16 0553) SpO2:  [91 %-100 %] 96 % (04/16 0809) Weight:  [109.6 kg (241 lb 10 oz)] 109.6 kg (241 lb 10 oz) (04/16 0553) Last BM Date: 10/24/17   480 p.o. 650 IV 1300 urine BM x1 Afebrile vital signs are stable. WBC rising 19.9.  Hemoglobin 8.2, hematocrit 27, platelets 568,000.  Magnesium 1.6 glucose 161 potassium 3.3. CT scan completed last evening shows a large sacral decubitus extending the adjacent rectosigmoid region there is a question of a fistula between the sacral decubitus and the rectum.  Also possible fistula from the rectosigmoid to the scrotum.  Complex large bilateral hydroceles/possible pyrocele.  Gas was noted in the posterior superior left aspect of the hydrocele prior reseal with fistula drainage to the skin.   Intake/Output from previous day: 04/15 0701 - 04/16 0700 In: 1210 [P.O.:480; I.V.:80; IV Piggyback:650] Out: 1300 [Urine:1300] Intake/Output this shift: No intake/output data recorded.  General appearance: alert, cooperative, no distress and Uncomfortable Resp: clear to auscultation bilaterally and Anterior exam GI: Soft, nontender no palpable organomegaly.   Lab Results:  Recent Labs    10/24/17 0531 10/25/17 0516  WBC 17.3* 19.9*  HGB 8.0* 8.2*  HCT 27.2* 27.6*  PLT 506* 568*    BMET Recent Labs    10/24/17 0531 10/25/17 0516  NA 137 141  K 3.4* 3.3*  CL 103 106  CO2 25 27  GLUCOSE 326* 161*  BUN 11 9  CREATININE 0.81 0.80  CALCIUM 7.3* 7.4*   PT/INR No results for input(s): LABPROT, INR in the last 72 hours.  Recent Labs   Lab 10/20/17 1630  AST 22  ALT 9*  ALKPHOS 132*  BILITOT 0.6  PROT 6.3*  ALBUMIN 1.7*     Lipase  No results found for: LIPASE   Medications: . atorvastatin  80 mg Oral q1800  . calcium-vitamin D  1 tablet Oral Daily  . Chlorhexidine Gluconate Cloth  6 each Topical Q0600  . feeding supplement (GLUCERNA SHAKE)  237 mL Oral TID BM  . feeding supplement (PRO-STAT SUGAR FREE 64)  30 mL Oral TID WC  . gabapentin  600 mg Oral BID  . hydrocortisone sod succinate (SOLU-CORTEF) inj  50 mg Intravenous Daily  . HYDROmorphone   Intravenous Q4H  . insulin aspart  0-9 Units Subcutaneous TID WC  . insulin aspart  3 Units Subcutaneous TID WC  . insulin detemir  10 Units Subcutaneous BID  . multivitamin with minerals  1 tablet Oral Daily  . mupirocin ointment  1 application Nasal BID  . pantoprazole  40 mg Oral Daily  . 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   Possible scrotal abscess - see picture above    -  Scrotal abscess I&D 12/27/15 Dr. Hadley Pen  - will review with Dr. Johna Sheriff, but I think we need to get a repeat CT and may need to include both  thighs      to see to the base of this.   FEN:  Carb Mod ID:  Vancomycin 4/11 =>> day 4; Zosyn x 1 dose; Meropenum 4/11 =>> day 4 DVT: SCD's only Foley:  In Follow up: TBD  Plan: He is to go to the OR later today for incision and debridement of the decubitus.  Will evaluate and see was under this.  Urology is also seen the patient will assist in examining the patient under anesthesia to evaluate for possible fistulous to the site of the decubitus and scrotum.      LOS: 5 days    Danisa Kopec 10/25/2017 [id number]

## 2017-10-25 NOTE — Anesthesia Preprocedure Evaluation (Addendum)
Anesthesia Evaluation  Patient identified by MRN, date of birth, ID band Patient awake    Reviewed: Allergy & Precautions, NPO status , Patient's Chart, lab work & pertinent test results  History of Anesthesia Complications (+) POST - OP SPINAL HEADACHE  Airway Mallampati: II  TM Distance: >3 FB Neck ROM: Full    Dental no notable dental hx.    Pulmonary sleep apnea and Oxygen sleep apnea , former smoker,    breath sounds clear to auscultation + decreased breath sounds      Cardiovascular hypertension, + CAD  + dysrhythmias Atrial Fibrillation  Rhythm:Irregular Rate:Tachycardia  Afib with RVR   Neuro/Psych negative neurological ROS  negative psych ROS   GI/Hepatic negative GI ROS, Neg liver ROS,   Endo/Other  negative endocrine ROSdiabetes  Renal/GU negative Renal ROS  negative genitourinary   Musculoskeletal negative musculoskeletal ROS (+)   Abdominal   Peds negative pediatric ROS (+)  Hematology  (+) anemia ,   Anesthesia Other Findings   Reproductive/Obstetrics negative OB ROS                           Anesthesia Physical Anesthesia Plan  ASA: IV  Anesthesia Plan: General   Post-op Pain Management:    Induction: Intravenous  PONV Risk Score and Plan: 2 and Ondansetron and Treatment may vary due to age or medical condition  Airway Management Planned: Oral ETT  Additional Equipment:   Intra-op Plan:   Post-operative Plan: Extubation in OR  Informed Consent: I have reviewed the patients History and Physical, chart, labs and discussed the procedure including the risks, benefits and alternatives for the proposed anesthesia with the patient or authorized representative who has indicated his/her understanding and acceptance.   Dental advisory given  Plan Discussed with: CRNA and Surgeon  Anesthesia Plan Comments:         Anesthesia Quick Evaluation

## 2017-10-25 NOTE — Op Note (Addendum)
Operative Note  Preoperative diagnosis:  1.  Scrotal abscess  Postoperative diagnosis: 1.  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  Examination under anesthesia 2.  Excisional debridement of skin and subcutaneous tissue of the scrotum, approximately 3 cm x 3 cm 3.  Excision of fistulous tract  Surgeon: Modena Slater, MD  Assistants: Dr. Glenna Fellows  Anesthesia: General  Complications: None immediate  EBL: Minimal  Specimens: 1.  Necrotic scrotal skin  Drains/Catheters: 1.  None  Intraoperative findings: 1.  Open draining scrotal wound in the posterior most portion of the midline of the scrotum.  There was a fistulous connection down the midline external and superior to the anus.  There was approximately a 3 cm x 3 cm area of necrotic/diseased skin from the chronic nonhealing wound that was excised  Indication: 67 year old male with multiple medical comorbidities including CHF, diabetes with poor control, atrial fibrillation on Xarelto, chronic bedbound with sacral decubitus ulcer who was noted to have drainage from the scrotum yesterday with turning.  He is status post scrotal abscess incision and drainage on 12/27/2015 by Dr. Hadley Pen.  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  There was gas within the posterior aspect of the left side of the scrotum with fistula communication to the skin.  The patient's white blood cell count has been increasing.  General surgery has taken him to the operating room for wound debridement of the sacral decubitus ulcer for further investigation.  I have been asked to assist with evaluation of the scrotal wound    Description of procedure:  I was called in intraoperatively to assist Dr. Johna Sheriff.  Upon entering the room, the patient was then lateral position with right side up.  We prepped and draped the patient in a standard sterile fashion.   A timeout was performed.  The patient was already on antibiotics.  The scrotal wound was probed and there was an obvious fistulous connection just superior to the anus.  The fistula was subcutaneous and did not contain any muscle fibers.  We divided the diseased tissue in between the fistulous connection and the chronically draining scrotal wound.  The 3 cm x 3 cm area of diseased scrotal skin was then excised with Bovie electrocautery and passed off for specimen.  Hemostasis was obtained with electrocautery.  There was an additional open wound right lateral.  This was probed and there was no obvious fistulous connection into the scrotum or inferior.  There is no obvious area to drain or excise here.  I examined the scrotum again.  There was no overlying erythema.  There is no obvious fluctuance or crepitus.  I felt like the scrotum had bilateral hydroceles rather than pyocele's based on a relatively benign exam and I did not feel it necessary to explore the scrotum.  This concluded my portion of involvement in the operation.  Plan: Recommend local wound care and wound care consult.

## 2017-10-25 NOTE — Progress Notes (Signed)
PROGRESS NOTE    [name]  TDS:[id number] DOB: 09-06-1950 DOA: 10/20/2017 PCP: [name], MD (Inactive)     Brief Narrative:  [name] is a 67 y.o. male with medical history significant for atrial fibrillation on Xarelto, coronary artery disease, insulin-dependent diabetes mellitus, chronic diastolic CHF, chronic low back pain, and microcytic anemia, now presenting to the emergency department from his SNF for evaluation of fevers and decreased responsiveness.  Patient was discharged from the hospital 1 week ago after management of septic shock and ventilator dependent respiratory failure, suspected secondary to aspiration pneumonia and complicated by C. difficile infection.  He had reportedly been recovering well at his SNF before he was noted to be listless and febrile on 4/11.  In the emergency department, he was found to be in atrial fibrillation with RVR, and concern for severe sepsis with lactic acid 4.1.  He was started on empiric vancomycin, merrem and admitted to stepdown unit. With improvement in HR, he was transitioned off cardizem gtt and transferred to telemetry unit.   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)   Atrial fibrillation with rapid ventricular response (HCC)   Acute encephalopathy   Protein-calorie malnutrition, severe (HCC)   Sepsis (HCC)   Occult blood in stools   Severe sepsis secondary to suspected source of sacral ulcer stage IV, POA, vs acinetobacter chronic indwelling catheter related UTI, POA, and 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 ID consulted, antibiotic changed to Unasyn to cover urinary source  - Exchange foley catheter 4/15  - OR today for incision and debridement of decubitus - Urology consulted, will evaluate fistula in OR with general surgery   Atrial fibrillation with RVR  - CHADS-VASc is at least 4 (age, CHF, DM, CAD). Xarelto on hold since admission - Rate controlled and off cardizem gtt. Will hold oral cardizem due to pause on tele overnight 4/13   Chronic microcytic anemia - Baseline Hgb around 8 - Positive FOBT on admission  - Hold Xarelto - BM without gross bleeding  - Monitor CBC, has been stable and no further report of gross GI bleeding  Chronic diastolic CHF  - Given IVF due to sepsis and low BP - Monitor volume status  - Stable   Insulin-dependent DM  - Ha1c 6.1  - Levemir, SSI   CAD - Continue lipitor, hold ASA initially while following CBC    Acute metabolic encephalopathy  - Resolved, seems to be back to his baseline  Hypokalemia -Replace, trend  Hypomagnesemia  -Replace, trend   DVT prophylaxis: SCD Code Status: DNR Family Communication: No family at bedside, left VM for sister today  Disposition Plan: OR today    Consultants:   General surgery   ID  Urology  Palliative care   Procedures:   None  Antimicrobials:  Anti-infectives (From admission, onward)   Start     Dose/Rate Route Frequency Ordered Stop   10/24/17 1200  Ampicillin-Sulbactam (UNASYN) 3 g in sodium chloride 0.9 % 100 mL IVPB     3 g 200 mL/hr over 30 Minutes Intravenous Every 6 hours 10/24/17 1030     10/22/17 1000  vancomycin (VANCOCIN) 1,250 mg in sodium chloride 0.9 % 250 mL IVPB   Status:  Discontinued     1,250 mg 166.7 mL/hr over 90 Minutes Intravenous Every 12 hours 10/22/17 0958 10/24/17 1030   10/20/17 2200  vancomycin (VANCOCIN) IVPB 1000 mg/200 mL premix  Status:  Discontinued     1,000 mg 200 mL/hr over 60 Minutes Intravenous Every 12 hours 10/20/17 2043 10/22/17 0958   10/20/17 2200  meropenem (MERREM) 1 g in sodium chloride 0.9 % 100 mL IVPB  Status:  Discontinued     1 g 200 mL/hr over 30 Minutes Intravenous Every 8 hours 10/20/17 2043 10/24/17 1030   10/20/17 2000  vancomycin (VANCOCIN) IVPB 1000 mg/200 mL premix  Status:  Discontinued     1,000 mg 200 mL/hr over 60 Minutes Intravenous  Once 10/20/17 1956 10/20/17 2051   10/20/17 1645  piperacillin-tazobactam (ZOSYN) IVPB 3.375 g     3.375 g 100 mL/hr over 30 Minutes Intravenous  Once 10/20/17 1636 10/20/17 1830   10/20/17 1645  vancomycin (VANCOCIN) IVPB 1000 mg/200 mL premix     1,000 mg 200 mL/hr over 60 Minutes Intravenous  Once 10/20/17 1636 10/20/17 1924       Subjective: Continues to complain of pain, upset about n.p.o. status.  Objective: Vitals:   10/25/17 0021 10/25/17 0400 10/25/17 0553 10/25/17 0809  BP:   123/67   Pulse:   (!) 114   Resp: 16 17 14 15   Temp:   98.1 F (36.7 C)   TempSrc:   Oral   SpO2: 95% 91% 97% 96%  Weight:   109.6 kg (241 lb 10 oz)   Height:        Intake/Output Summary (Last 24 hours) at 10/25/2017 1124 Last data filed at 10/25/2017 0604 Gross per 24 hour  Intake 720 ml  Output 1300 ml  Net -580 ml   Filed Weights   10/21/17 0111 10/24/17 0343 10/25/17 0553  Weight: 114 kg (251 lb 5.2 oz) 110.4 kg (243 lb 6.2 oz) 109.6 kg (241 lb 10 oz)    Examination: General exam: Appears calm and comfortable  Respiratory system: Clear to auscultation anteriorly. Respiratory effort normal. Cardiovascular system: S1 & S2 heard, RRR. No JVD, murmurs, rubs, gallops or clicks. +trace pedal edema left  Gastrointestinal system: Abdomen is nondistended, soft and  nontender. No organomegaly or masses felt. Normal bowel sounds heard. Central nervous system: Alert and oriented. No focal neurological deficits. Extremities: +right AKA  Psychiatry: Judgement and insight appear stable    Data Reviewed: I have personally reviewed following labs and imaging studies  CBC: Recent Labs  Lab 10/20/17 1630 10/21/17 0122 10/22/17 0350 10/23/17 0443 10/24/17 0531 10/25/17 0516  WBC 11.7* 10.8* 7.1 14.4* 17.3* 19.9*  NEUTROABS 9.5* 9.6*  --   --   --   --   HGB 8.5* 7.4* 7.1* 7.8* 8.0* 8.2*  HCT 28.3* 23.9* 23.3* 26.1* 27.2* 27.6*  MCV 78.2 76.8* 77.9* 78.4 79.1 79.8  PLT 317 262 302 421* 506* 568*   Basic Metabolic Panel: Recent Labs  Lab 10/21/17 0122 10/22/17 0350 10/23/17 0443 10/24/17 0531 10/25/17 0516  NA 135  137 139 137 141  K 3.6 2.8* 3.5 3.4* 3.3*  CL 100* 102 105 103 106  CO2 23 26 24 25 27   GLUCOSE 222* 286* 265* 326* 161*  BUN 12 12 11 11 9   CREATININE 1.00 0.73 0.74 0.81 0.80  CALCIUM 7.3* 7.6* 7.5* 7.3* 7.4*  MG 1.6* 2.1  --   --  1.6*   GFR: Estimated Creatinine Clearance: 116.1 mL/min (by C-G formula based on SCr of 0.8 mg/dL). Liver Function Tests: Recent Labs  Lab 10/20/17 1630  AST 22  ALT 9*  ALKPHOS 132*  BILITOT 0.6  PROT 6.3*  ALBUMIN 1.7*   No results for input(s): LIPASE, AMYLASE in the last 168 hours. No results for input(s): AMMONIA in the last 168 hours. Coagulation Profile: Recent Labs  Lab 10/20/17 1630  INR 2.54   Cardiac Enzymes: No results for input(s): CKTOTAL, CKMB, CKMBINDEX, TROPONINI in the last 168 hours. BNP (last 3 results) No results for input(s): PROBNP in the last 8760 hours. HbA1C: No results for input(s): HGBA1C in the last 72 hours. CBG: Recent Labs  Lab 10/24/17 0743 10/24/17 1209 10/24/17 1700 10/24/17 2215 10/25/17 0758  GLUCAP 339* 254* 159* 96 129*   Lipid Profile: No results for input(s): CHOL, HDL, LDLCALC, TRIG, CHOLHDL, LDLDIRECT in the last 72  hours. Thyroid Function Tests: No results for input(s): TSH, T4TOTAL, FREET4, T3FREE, THYROIDAB in the last 72 hours. Anemia Panel: No results for input(s): VITAMINB12, FOLATE, FERRITIN, TIBC, IRON, RETICCTPCT in the last 72 hours. Sepsis Labs: Recent Labs  Lab 10/20/17 1630 10/20/17 2054 10/21/17 0122  PROCALCITON  --  0.15  --   LATICACIDVEN 4.10* 2.2* 0.9    Recent Results (from the past 240 hour(s))  Culture, blood (Routine x 2)     Status: None (Preliminary result)   Collection Time: 10/20/17  4:30 PM  Result Value Ref Range Status   Specimen Description   Final    BLOOD LEFT ANTECUBITAL Performed at [facility], [address] [place], [place] [address]    Special Requests   Final    BOTTLES DRAWN AEROBIC AND ANAEROBIC Blood Culture adequate volume Performed at [facility], [address] [place], [place] [address]    Culture   Final    NO GROWTH 4 DAYS Performed at [facility], [address], [place], [place] [address]    Report Status PENDING  Incomplete  Urine culture     Status: Abnormal   Collection Time: 10/20/17  4:58 PM  Result Value Ref Range Status   Specimen Description   Final    URINE, RANDOM Performed at [facility], [address], [place], [place] [address]    Special Requests   Final    NONE Performed at [facility], [address], [place], [place] [address]    Culture (A)  Final    >=100,000 COLONIES/mL ACINETOBACTER CALCOACETICUS/BAUMANNII COMPLEX   Report Status 10/24/2017 FINAL  Final   Organism ID, Bacteria ACINETOBACTER CALCOACETICUS/BAUMANNII COMPLEX (A)  Final      Susceptibility   Acinetobacter calcoaceticus/baumannii complex - MIC*    CEFTAZIDIME >=64 RESISTANT Resistant     CEFTRIAXONE 32 INTERMEDIATE Intermediate     CIPROFLOXACIN >=4 RESISTANT Resistant     GENTAMICIN 8 INTERMEDIATE Intermediate     IMIPENEM >=16 RESISTANT Resistant      PIP/TAZO >=128 RESISTANT Resistant     TRIMETH/SULFA >=320 RESISTANT Resistant     CEFEPIME 32  RESISTANT Resistant     AMPICILLIN/SULBACTAM RESISTANT Resistant     * >=100,000 COLONIES/mL ACINETOBACTER CALCOACETICUS/BAUMANNII COMPLEX  MRSA PCR Screening     Status: Abnormal   Collection Time: 10/21/17  1:12 AM  Result Value Ref Range Status   MRSA by PCR POSITIVE (A) NEGATIVE Final    Comment:        The GeneXpert MRSA Assay (FDA approved for NASAL specimens only), is one component of a comprehensive MRSA colonization surveillance program. It is not intended to diagnose MRSA infection nor to guide or monitor treatment for MRSA infections. RESULT CALLED TO, READ BACK BY AND VERIFIED WITH: Ranell Patrick @0525  10/21/17 MKELLY Performed at [facility], [address] [place], [place]        Radiology Studies: Ct Abdomen Pelvis W Contrast  Result Date: 10/24/2017 CLINICAL DATA:  67 year old male with large sacral decubitus ulcer with purulent drainage from the base of the scrotum. Poorly controlled diabetes. Subsequent encounter. EXAM: CT ABDOMEN AND PELVIS WITH CONTRAST TECHNIQUE: Multidetector CT imaging of the abdomen and pelvis was performed using the standard protocol following bolus administration of intravenous contrast. CONTRAST:  71 OMNIPAQUE IOHEXOL 300 MG/ML  SOLN COMPARISON:  10/11/2017 CT. FINDINGS: Lower chest: Bilateral pleural effusions with bibasilar consolidation suggestive of atelectasis with infiltrates a secondary consideration. Mild cardiomegaly.  Coronary artery calcifications. Hepatobiliary: No worrisome hepatic lesion. Fluid surrounds the gallbladder which may be related to third spacing of fluid, limiting evaluation for detection of cholecystitis. If this were of concern ultrasound may then be considered. Pancreas: No pancreatic mass or primary pancreatic inflammation. Spleen: No splenic mass.  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  Adrenals/Urinary Tract: 3.2 cm left adrenal lesion unchanged from recent CT but increased in size from 07/12/2015. It is possible this represents an adrenal adenoma however, correlation with elective adrenal MR recommended after patient's acute episodes has cleared. No obstructing stone.  No worrisome renal lesion. Partial contracted urinary bladder with circumferential wall thickening. Foley catheter in place. Stomach/Bowel: Large sacral decubitus ulcer extends adjacent to rectosigmoid region. There may be a fistula between the sacral decubitus and the rectum. From the rectosigmoid region, there may be a fistula to the scrotum contributing to large complex bilateral hydroceles/pyoceles Gas seen within the posterior superior aspect of left hydrocele/pyocele with fistula drainage to skin. Sigmoid and descending colon diverticula. Given the degree of third spacing of fluid, limited for detecting primary bowel inflammatory process. No free intraperitoneal air. Vascular/Lymphatic: Atherosclerotic changes aorta without aneurysm. Scattered reactive lymph nodes. Reproductive: No worrisome abnormality. Other: No free intraperitoneal air. Musculoskeletal: Degenerative changes lumbar spine. Mild hip joint degenerative changes. IMPRESSION: Large sacral decubitus ulcer extends adjacent to rectosigmoid region. There may be a fistula between the sacral decubitus and the rectum. From the rectosigmoid region, there may be a fistula to the scrotum contributing to large complex bilateral hydroceles/pyoceles Gas seen within the posterior superior aspect of left hydrocele/pyocele with fistula drainage to skin. Third spacing of fluid limits evaluation for detection of cholecystitis or primary bowel inflammatory process. Bilateral pleural effusions with bibasilar consolidation suggestive of atelectasis with infiltrates a secondary consideration. Mild cardiomegaly. Coronary artery calcifications. Aortic Atherosclerosis (ICD10-I70.0). 3.2  cm left adrenal lesion unchanged from recent CT but increased in size from 07/12/2015. It is possible this represents an adrenal adenoma however, correlation with elective adrenal MR recommended after patient's acute episodes has cleared. Partial contracted urinary bladder with circumferential wall thickening. Foley catheter in place. These results will be called to  the ordering clinician or representative by the Radiologist Assistant, and communication documented in the PACS or zVision Dashboard. Electronically Signed   By: Lacy Duverney M.D.   On: 10/24/2017 19:39      Scheduled Meds: . atorvastatin  80 mg Oral q1800  . calcium-vitamin D  1 tablet Oral Daily  . Chlorhexidine Gluconate Cloth  6 each Topical Q0600  . feeding supplement (GLUCERNA SHAKE)  237 mL Oral TID BM  . feeding supplement (PRO-STAT SUGAR FREE 64)  30 mL Oral TID WC  . gabapentin  600 mg Oral BID  . [START ON 10/26/2017] hydrocortisone sod succinate (SOLU-CORTEF) inj  25 mg Intravenous Daily  . HYDROmorphone   Intravenous Q4H  . insulin aspart  0-9 Units Subcutaneous TID WC  . insulin aspart  3 Units Subcutaneous TID WC  . insulin detemir  10 Units Subcutaneous BID  . multivitamin with minerals  1 tablet Oral Daily  . mupirocin ointment  1 application Nasal BID  . pantoprazole  40 mg Oral Daily  . sodium chloride flush  3 mL Intravenous Q12H   Continuous Infusions: . ampicillin-sulbactam (UNASYN) IV Stopped (10/25/17 0981)  . potassium chloride 10 mEq (10/25/17 1015)     LOS: 5 days    Time spent: 20 minutes   Noralee Stain, DO [name] [email or web address] Password [facility] 10/25/2017, 11:24 AM

## 2017-10-26 ENCOUNTER — Encounter (HOSPITAL_COMMUNITY): Payer: Self-pay | Admitting: General Surgery

## 2017-10-26 DIAGNOSIS — E113299 Type 2 diabetes mellitus with mild nonproliferative diabetic retinopathy without macular edema, unspecified eye: Secondary | ICD-10-CM

## 2017-10-26 DIAGNOSIS — L0390 Cellulitis, unspecified: Secondary | ICD-10-CM

## 2017-10-26 DIAGNOSIS — I5032 Chronic diastolic (congestive) heart failure: Secondary | ICD-10-CM

## 2017-10-26 DIAGNOSIS — N492 Inflammatory disorders of scrotum: Secondary | ICD-10-CM

## 2017-10-26 DIAGNOSIS — R8271 Bacteriuria: Secondary | ICD-10-CM

## 2017-10-26 DIAGNOSIS — I4891 Unspecified atrial fibrillation: Secondary | ICD-10-CM

## 2017-10-26 DIAGNOSIS — I2510 Atherosclerotic heart disease of native coronary artery without angina pectoris: Secondary | ICD-10-CM

## 2017-10-26 DIAGNOSIS — L89153 Pressure ulcer of sacral region, stage 3: Secondary | ICD-10-CM

## 2017-10-26 DIAGNOSIS — R195 Other fecal abnormalities: Secondary | ICD-10-CM

## 2017-10-26 DIAGNOSIS — Z9861 Coronary angioplasty status: Secondary | ICD-10-CM

## 2017-10-26 LAB — CBC
HEMATOCRIT: 28 % — AB (ref 39.0–52.0)
HEMOGLOBIN: 8.1 g/dL — AB (ref 13.0–17.0)
MCH: 22.9 pg — ABNORMAL LOW (ref 26.0–34.0)
MCHC: 28.9 g/dL — ABNORMAL LOW (ref 30.0–36.0)
MCV: 79.3 fL (ref 78.0–100.0)
Platelets: 560 10*3/uL — ABNORMAL HIGH (ref 150–400)
RBC: 3.53 MIL/uL — AB (ref 4.22–5.81)
RDW: 20.5 % — ABNORMAL HIGH (ref 11.5–15.5)
WBC: 19.9 10*3/uL — AB (ref 4.0–10.5)

## 2017-10-26 LAB — GLUCOSE, CAPILLARY
GLUCOSE-CAPILLARY: 72 mg/dL (ref 65–99)
GLUCOSE-CAPILLARY: 44 mg/dL — AB (ref 65–99)
GLUCOSE-CAPILLARY: 67 mg/dL (ref 65–99)
GLUCOSE-CAPILLARY: 103 mg/dL — AB (ref 65–99)
Glucose-Capillary: 108 mg/dL — ABNORMAL HIGH (ref 65–99)
Glucose-Capillary: 135 mg/dL — ABNORMAL HIGH (ref 65–99)

## 2017-10-26 LAB — BASIC METABOLIC PANEL
ANION GAP: 7 (ref 5–15)
BUN: 8 mg/dL (ref 6–20)
CALCIUM: 7.2 mg/dL — AB (ref 8.9–10.3)
CO2: 28 mmol/L (ref 22–32)
Chloride: 109 mmol/L (ref 101–111)
Creatinine, Ser: 0.68 mg/dL (ref 0.61–1.24)
Glucose, Bld: 116 mg/dL — ABNORMAL HIGH (ref 65–99)
POTASSIUM: 3.9 mmol/L (ref 3.5–5.1)
Sodium: 144 mmol/L (ref 135–145)

## 2017-10-26 LAB — MAGNESIUM: MAGNESIUM: 1.9 mg/dL (ref 1.7–2.4)

## 2017-10-26 MED ORDER — ASPIRIN EC 81 MG PO TBEC
81.0000 mg | DELAYED_RELEASE_TABLET | Freq: Every day | ORAL | Status: DC
  Administered 2017-10-26 – 2017-11-01 (×7): 81 mg via ORAL
  Filled 2017-10-26 (×6): qty 1

## 2017-10-26 MED ORDER — RIVAROXABAN 20 MG PO TABS
20.0000 mg | ORAL_TABLET | Freq: Every day | ORAL | Status: DC
  Administered 2017-10-26 – 2017-11-01 (×7): 20 mg via ORAL
  Filled 2017-10-26 (×8): qty 1

## 2017-10-26 MED ORDER — METOPROLOL TARTRATE 25 MG PO TABS
12.5000 mg | ORAL_TABLET | Freq: Two times a day (BID) | ORAL | Status: DC
  Administered 2017-10-26 – 2017-11-01 (×13): 12.5 mg via ORAL
  Filled 2017-10-26 (×13): qty 1

## 2017-10-26 MED ORDER — GUAIFENESIN ER 600 MG PO TB12
600.0000 mg | ORAL_TABLET | Freq: Two times a day (BID) | ORAL | Status: DC
  Administered 2017-10-26 – 2017-11-01 (×13): 600 mg via ORAL
  Filled 2017-10-26 (×13): qty 1

## 2017-10-26 NOTE — Progress Notes (Signed)
CSW following to assist with discharge planning.   CSW spoke with patient at bedside, patient confirmed plan to dc to SNF when medically stable. CSW asked patient if patient's sister completed Medicaid application for SNF, patient replied "yes". CSW asked patient if CSW could contact his sister to discuss medicaid application and discharge planning, patient replied "yes".   CSW contacted patient's sister [name] [phone]), no answer. CSW left voicemail requesting return phone call.  CSW will continue to follow and assist with discharge planning.  Celso Sickle, [place] Clinical Social Worker [facility] Cell#: [phone]

## 2017-10-26 NOTE — Progress Notes (Signed)
Daily Progress Note   Patient Name: [name]       Date: 10/26/2017 DOB: 1951-07-10  Age: 67 y.o. MRN#: [id number] Attending Physician: Meredeth Ide, MD Primary Care Physician: [name], MD (Inactive) Admit Date: 10/20/2017  Reason for Consultation/Follow-up: Establishing goals of care and Pain control  Subjective: Awake alert  Resting in bed Pain control some what better Still on PCA, see below Surgery, ID and urology following, notes reviewed.   PPS 30% See below.  Length of Stay: 6  Current Medications: Scheduled Meds:  . atorvastatin  80 mg Oral q1800  . calcium-vitamin D  1 tablet Oral Daily  . Chlorhexidine Gluconate Cloth  6 each Topical Q0600  . gabapentin  600 mg Oral BID  . guaiFENesin  600 mg Oral BID  . HYDROmorphone   Intravenous Q4H  . insulin aspart  0-9 Units Subcutaneous TID WC  . insulin aspart  3 Units Subcutaneous TID WC  . insulin detemir  10 Units Subcutaneous BID  . metoprolol tartrate  12.5 mg Oral BID  . multivitamin with minerals  1 tablet Oral Daily  . mupirocin ointment  1 application Nasal BID  . pantoprazole  40 mg Oral Daily  . rivaroxaban  20 mg Oral Q supper  . sodium chloride flush  3 mL Intravenous Q12H    Continuous Infusions: . ampicillin-sulbactam (UNASYN) IV Stopped (10/26/17 1422)  . lactated ringers 100 mL/hr at 10/26/17 0525    PRN Meds: acetaminophen **OR** acetaminophen, albuterol, diphenhydrAMINE **OR** diphenhydrAMINE, HYDROmorphone (DILAUDID) injection, naloxone **AND** sodium chloride flush, ondansetron **OR** ondansetron (ZOFRAN) IV, polyvinyl alcohol, white petrolatum  Physical Exam        .  Obese, pale in appearance  Cardiovascular: Normal rate. An irregularly irregular rhythm present.    Pulmonary/Chest: No accessory muscle usage. No tachypnea. No respiratory distress. He has decreased breath sounds.  regular breaths  Abdominal: Soft. Normal appearance.   Vital Signs: BP 112/76   Pulse (!) 102   Temp 98.1 F (36.7 C) (Oral)   Resp 18   Ht 6' (1.829 m)   Wt 112.3 kg (247 lb 9.2 oz)   SpO2 98%   BMI 33.58 kg/m  SpO2: SpO2: 98 % O2 Device: O2 Device: Nasal Cannula O2 Flow Rate: O2 Flow Rate (L/min): 3 L/min  Intake/output  summary:   Intake/Output Summary (Last 24 hours) at 10/26/2017 1454 Last data filed at 10/26/2017 0600 Gross per 24 hour  Intake 2985 ml  Output 1000 ml  Net 1985 ml   LBM: Last BM Date: 10/24/17 Baseline Weight: Weight: 121.6 kg (268 lb) Most recent weight: Weight: 112.3 kg (247 lb 9.2 oz)       Palliative Assessment/Data:    Flowsheet Rows     Most Recent Value  Intake Tab  Referral Department  Hospitalist  Unit at Time of Referral  Med/Surg Unit  Palliative Care Primary Diagnosis  Sepsis/Infectious Disease  Date Notified  10/21/17  Palliative Care Type  Return patient Palliative Care  Reason for referral  Clarify Goals of Care, Pain  Date of Admission  10/20/17  Date first seen by Palliative Care  10/22/17  # of days Palliative referral response time  1 Day(s)  # of days IP prior to Palliative referral  1  Clinical Assessment  Palliative Performance Scale Score  50%  Pain Max last 24 hours  9  Pain Min Last 24 hours  3  Psychosocial & Spiritual Assessment  Palliative Care Outcomes      Patient Active Problem List   Diagnosis Date Noted  . Protein-calorie malnutrition, severe (HCC) 10/20/2017  . Sepsis (HCC) 10/20/2017  . Occult blood in stools 10/20/2017  . C. difficile diarrhea 10/12/2017  . Hypomagnesemia 10/12/2017  . Septic shock (HCC) 09/29/2017  . Acute encephalopathy   . Palliative care encounter   . UTI (urinary tract infection) 09/01/2017  . Acute metabolic encephalopathy 09/01/2017  . S/P AKA (above  knee amputation) unilateral, right (HCC) 09/01/2017  . Atrial fibrillation with rapid ventricular response (HCC)   . Hyponatremia 04/11/2017  . ARF (acute renal failure) (HCC) 04/11/2017  . Pressure injury of skin 03/24/2017  . Below knee amputation status, right (HCC) 03/23/2017  . DKA (diabetic ketoacidoses) (HCC) 03/14/2017  . Elevated troponin 03/14/2017  . Diabetic polyneuropathy associated with type 2 diabetes mellitus (HCC)   . Osteoarthritis of both knees   . Diabetic nephropathy associated with type 2 diabetes mellitus (HCC)   . Bilateral lower leg cellulitis 01/22/2017  . Venous stasis 08/06/2016  . At high risk for falls 07/21/2016  . CHF exacerbation (HCC) 06/10/2016  . Lactic acidosis 06/09/2016  . Syncope and collapse 03/05/2016  . Acute on chronic renal insufficiency 03/05/2016  . Obesity 12/30/2015  . Scrotal swelling   . Foot fracture   . 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 Lower GI bleed   . Pressure ulcer 12/24/2015  . Acute kidney injury (HCC) 12/23/2015  . Back pain 12/23/2015  . Fall   . Metatarsal fracture   . Traumatic rhabdomyolysis (HCC)   . Hyperkalemia   . Lisfranc dislocation   . Hyperglycemia   . Sepsis due to skin infection (HCC) 09/08/2015  . Scrotal wall abscess 09/08/2015  . AKI (acute kidney injury) (HCC) 09/08/2015  . Chest wall pain   . On amiodarone therapy 12/08/2014  . Respiratory failure with hypoxia (HCC) 12/04/2014  . Hypoxia 12/04/2014  . Acute on chronic respiratory failure with hypoxia (HCC)   . Atrial fibrillation with RVR (HCC) 09/20/2014  . Chronic diastolic CHF (congestive heart failure) (HCC) 09/19/2014  . Acute respiratory failure (HCC) 09/19/2014  . Abnormal nuclear cardiac imaging test 02/12/2014  . Dyspnea 02/12/2014  . Chronic anticoagulation- Coumadin 02/12/2014  . CKD (chronic kidney disease) stage 3, GFR 30-59 ml/min (HCC) 02/12/2014  .  Chronic diastolic congestive heart failure (HCC)  02/11/2014  . Encounter for therapeutic drug monitoring 08/03/2013  . Pilonidal cyst 01/25/2013  . Perirectal cellulitis 09/06/2012  . Long term (current) use of anticoagulants 11/18/2011  . Obstructive sleep apnea- unable to tolerate c-pap 08/04/2011  . CAD S/P percutaneous coronary angioplasty 08/04/2011  . ANEMIA-IRON DEFICIENCY 01/06/2009  . OTHER AND UNSPECIFIED COAGULATION DEFECTS 01/06/2009  . Anemia 01/02/2009  . CARDIOVASCULAR FUNCTION STUDY, ABNORMAL 01/02/2009  . Essential hypertension, benign 10/22/2008  . Atrial fibrillation 10/22/2008  . Hyperlipidemia 09/23/2008  . Hypokalemia 09/23/2008  . Obesity, Class III, BMI 40-49.9 (morbid obesity) (HCC) 09/23/2008  . CELLULITIS AND ABSCESS OF LEG EXCEPT FOOT 09/23/2008    Palliative Care Assessment & Plan   Patient Profile: 67 y.o. male  with past medical history of hypertension, hyperlipidemia, diabetes mellitus, atrial fibrillation onXarelto, OSA not on CPAP, GI bleeding,dCHF,COPD, CKD3,s/p of right AKA admitted on 10/20/2017 with sepsis, a fib, and concern for GI bleeding.  This is his 3rd admission in 6 months and palliative was involved in care during recent admission with discharge on 4/5.  Palliative consulted for goals of care and assistance with pain management.  Recommendations/Plan: -Pain: Pain better controlled with increase of PCA,     Discussed with patient about de escalating from PCA on 10-27-17. Patient was on PO PRN Oxy IR prior to this hospitalization. He was not on long acting PO medications, he states he used to go to [facility].      -Goals of care: patient wishes to continue antibiotics and wishes to go to [facility] towards the end of this hospitalization. Goals are not comfort-only/hospice focused at this point.    Code Status:    Code Status Orders  (From admission, onward)        Start     Ordered   10/20/17 1953  Do not attempt resuscitation (DNR)  Continuous    Question Answer  Comment  In the event of cardiac or respiratory ARREST Do not call a "code blue"   In the event of cardiac or respiratory ARREST Do not perform Intubation, CPR, defibrillation or ACLS   In the event of cardiac or respiratory ARREST Use medication by any route, position, wound care, and other measures to relive pain and suffering. May use oxygen, suction and manual treatment of airway obstruction as needed for comfort.      10/20/17 1956    Code Status History    Date Active Date Inactive Code Status Order ID Comments User Context   09/29/2017 0457 10/15/2017 0232 Full Code [id number]  Erin Fulling, MD ED   09/08/2017 1026 09/13/2017 2008 DNR [id number]  Alwyn Ren, MD Inpatient   09/01/2017 0535 09/08/2017 1026 Full Code [id number]  Lorretta Harp, MD ED   04/11/2017 0425 04/22/2017 0201 DNR [id number]  Pearson Grippe, MD ED   03/23/2017 1511 03/28/2017 2216 DNR [id number]  Nadara Mustard, MD Inpatient   03/14/2017 0831 03/18/2017 2212 Full Code [id number]  Ozella Rocks, MD ED   03/14/2017 0831 03/14/2017 0831 Full Code [id number]  Ozella Rocks, MD ED   01/22/2017 0857 01/25/2017 2139 Full Code [id number]  Jordan Hawks, DO Inpatient   06/10/2016 0946 06/18/2016 0030 Full Code [id number]  Therisa Doyne, MD Inpatient   12/23/2015 2049 12/30/2015 1821 DNR [id number]  Casey Burkitt, MD Inpatient   09/08/2015 2156 09/17/2015 2240 Full Code [id number]  Ron Parker, MD Inpatient   07/08/2015 2124 07/10/2015 1811 Full Code [id number]  Lorretta Harp, MD ED   12/04/2014 2303 12/11/2014 2124 Full Code [id number]  Pearson Grippe, MD Inpatient   09/19/2014 1806 09/24/2014 1845 Full Code [id number]  Corrie Dandy Inpatient   02/11/2014 1557 02/14/2014 1629 Full Code [id number]  Rosalio Macadamia, NP Inpatient   09/07/2012 0131 09/10/2012 1731 Full Code [id number]  Eduard Clos, MD Inpatient       Prognosis:   Unable to determine  Discharge Planning:  To Be Determined  Care plan was discussed  with patient,     Thank you for allowing the Palliative Medicine Team to assist in the care of this patient.   Total Time 25 Prolonged Time Billed No      Greater than 50%  of this time was spent counseling and coordinating care related to the above assessment and plan.  Rosalin Hawking, MD [phone] Please contact Palliative Medicine Team phone at [phone] for questions and concerns.

## 2017-10-26 NOTE — Progress Notes (Addendum)
1 Day Post-Op    CC:  decubitus  Subjective: Sleeping now in AM, not really wanting to have dressing change, complains of pain with raising and lowering head of the bed.  Dressings taken down and every thing looks pretty good.  Some areas in the decubitus site to receive ongoing hydro Rx.    Objective: Vital signs in last 24 hours: Temp:  [97.5 F (36.4 C)-98.6 F (37 C)] 98.1 F (36.7 C) (04/17 0515) Pulse Rate:  [100-137] 109 (04/17 0515) Resp:  [10-18] 11 (04/17 0515) BP: (102-130)/(65-89) 115/75 (04/17 0515) SpO2:  [89 %-100 %] 98 % (04/17 0515) Weight:  [112.3 kg (247 lb 9.2 oz)] 112.3 kg (247 lb 9.2 oz) (04/17 0515) Last BM Date: 10/24/17 480 PO 2500 IV 950 urine recorded Afebrile, some tachycardia, VSS BMP OK WBC still elevated    Intake/Output from previous day: 04/16 0701 - 04/17 0700 In: 2985 [P.O.:480; I.V.:2105; IV Piggyback:400] Out: 1000 [Urine:950; Blood:50] Intake/Output this shift: No intake/output data recorded.  General appearance: alert, cooperative, no distress and anxious and has ongoing pain when awake.  Skin: We took down all the dressings and both the scrotum and the decubitus are clean.    Lab Results:  Recent Labs    10/25/17 0516 10/26/17 0025  WBC 19.9* 19.9*  HGB 8.2* 8.1*  HCT 27.6* 28.0*  PLT 568* 560*    BMET Recent Labs    10/25/17 0516 10/26/17 0025  NA 141 144  K 3.3* 3.9  CL 106 109  CO2 27 28  GLUCOSE 161* 116*  BUN 9 8  CREATININE 0.80 0.68  CALCIUM 7.4* 7.2*   PT/INR No results for input(s): LABPROT, INR in the last 72 hours.  Recent Labs  Lab 10/20/17 1630  AST 22  ALT 9*  ALKPHOS 132*  BILITOT 0.6  PROT 6.3*  ALBUMIN 1.7*     Lipase  No results found for: LIPASE   Medications: . atorvastatin  80 mg Oral q1800  . calcium-vitamin D  1 tablet Oral Daily  . Chlorhexidine Gluconate Cloth  6 each Topical Q0600  . feeding supplement (GLUCERNA SHAKE)  237 mL Oral TID BM  . feeding supplement  (PRO-STAT SUGAR FREE 64)  30 mL Oral TID WC  . gabapentin  600 mg Oral BID  . hydrocortisone sod succinate (SOLU-CORTEF) inj  25 mg Intravenous Daily  . HYDROmorphone   Intravenous Q4H  . insulin aspart  0-9 Units Subcutaneous TID WC  . insulin aspart  3 Units Subcutaneous TID WC  . insulin detemir  10 Units Subcutaneous BID  . multivitamin with minerals  1 tablet Oral Daily  . mupirocin ointment  1 application Nasal BID  . pantoprazole  40 mg Oral Daily  . 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   - DEBRIDEMENT SACRAL DECUBITUS skin, subcutaneous tissue and fascia. Incision drainage and       debridement skin and subcutaneous tissue scrotum, 10/25/17, Dr. 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 Hadley Pen  - will review with Dr. Johna Sheriff, but I think we need to get a repeat CT and may need to include both thighs to see to the base of this.    Examination under anesthesia,  Excisional debridement of skin and subcutaneous tissue of the scrotum,       approximately 3 cm x 3 cm,  Excision  of fistulous tract,10/25/17 Dr. Modena Slater  POD1  FEN: Carb Mod ID: Vancomycin 4/11 =>>day 4; Zosyn x 1 dose; Meropenum 4/11 =>>day 4 DVT: SCD's only Foley: In Follow up: TBD  Plan:  Continue hydro therapy to decubitus, BID dressing changes, we will start that tomorrow.  I will get him a Bariatric bed so we have more room to work with him, and hopefully improve his comfort.  From our standpoint he can go on DVT prophylaxis, but will defer to Medicine especially with his anemia.   I ask nutrition to see and help with his diet and supplements.  I also told him nutrition would play a large role in his ability to heal these wounds.       LOS: 6 days    Durell Lofaso 10/26/2017 [phone]

## 2017-10-26 NOTE — Progress Notes (Signed)
Nutrition Follow-up  DOCUMENTATION CODES:   Obesity unspecified  INTERVENTION:  - Will d/c Glucerna Shake and Prostat. - Will order Magic Cup BID with meals, each supplement provides 290 kcal and 9 grams of protein - Continue to encourage PO intakes.   NUTRITION DIAGNOSIS:   Increased nutrient needs related to acute illness, wound healing as evidenced by estimated needs. -ongoing  GOAL:   Patient will meet greater than or equal to 90% of their needs -minimally met on average.   MONITOR:   PO intake, Supplement acceptance, Weight trends, Labs, Skin  ASSESSMENT:   67 y.o. male with medical history significant for afib, CAD, insulin-dependent DM, diastolic CHF, chronic low back pain, and microcytic anemia. He presented to the ED from SNF for evaluation of fevers and decreased responsiveness. Patient was discharged from the hospital 1 week ago after management of septic shock and VDRF, suspected secondary to aspiration PNA and complicated by C. difficile infection. He had reportedly been recovering well at his SNF before he was noted to be listless and febrile on day of admission. Patient had not been expressing any specific complaints leading up to this and there has not been any cough or vomiting noted. No diarrhea reported. Nursing reported seeing some blood and having suspicion for possible GI bleed.  Weight -4 lbs/1.7 kg compared to weight on 4/13. Diet advanced from NPO to CLD on 4/12 from CLD to [place] on 4/13, and from FLD to Carb Modified on 4/14. Diet then changed back to NPO at midnight on 4/16 and back to Carb Modified yesterday at 5:45 PM. Pt ate 100% of meals on 4/15.   He was NPO yesterday for debridement of scrotal abscess and excision of fistulous tract. Pt has been refusing Glucerna and Prostat supplements. Will change supplements as outlined above.  Per Dr. Jeannine Kitten note yesterday AM: pt with severe sepsis stage 4 sacral wound, chronic microcytic anemia, acute metabolic  encephalopathy is now resolved and pt back to baseline mentation.   Medications reviewed; 1 tablet Oscal-D/day, 25 mg Solu-cortef/day, sliding scale Novolog, 3 units Novolog TID, 10 units Levemir BID, daily multivitamin with minerals, 40 mg oral Protonix/day, 10 meQ IV KCl x4 runs today. Labs reviewed; CBG: 72 mg/dL, Ca: 7.2 mg/dL.  IVF: LR @ 100 mL/hr.    Diet Order:  Diet Carb Modified Fluid consistency: Thin; Room service appropriate? Yes  EDUCATION NEEDS:   No education needs have been identified at this time  Skin:  Skin Assessment: Skin Integrity Issues: Skin Integrity Issues:: Stage IV Stage IV: sacrum Incisions: R leg (2/21); bilateral scrotum (3/28)  Last BM:  4/15  Height:   Ht Readings from Last 1 Encounters:  10/21/17 6' (1.829 m)    Weight:   Wt Readings from Last 1 Encounters:  10/26/17 247 lb 9.2 oz (112.3 kg)    Ideal Body Weight:  80.91 kg  BMI:  Body mass index is 33.58 kg/m.  Estimated Nutritional Needs:   Kcal:  2050-2280 (18-20 kcal/kg)  Protein:  135-148 grams (1.2-1.3 grams/kg)  Fluid:  >/= 2.2 L/day      Jarome Matin, MS, RD, LDN, [facility] Inpatient Clinical Dietitian Pager # [phone] After hours/weekend pager # [phone]

## 2017-10-26 NOTE — Progress Notes (Signed)
[facility]   Reason for visit: Follow up on abscess  Interval History: now noted to have scrotal abscess after operative evaluation and debrided by Dr. Alvester Morin; noted fistula, sacral decubitus with necrotic skin.    Physical Exam: Constitutional:  Vitals:   10/26/17 1156 10/26/17 1335  BP:  112/76  Pulse:  (!) 102  Resp: 18 18  Temp:    SpO2: 98%    patient appears in NAD Respiratory: Normal respiratory effort; CTA B Cardiovascular: RRR GI: soft, nt, nd  Review of Systems: Constitutional: negative for chills Integument/breast: negative for rash  Lab Results  Component Value Date   WBC 19.9 (H) 10/26/2017   HGB 8.1 (L) 10/26/2017   HCT 28.0 (L) 10/26/2017   MCV 79.3 10/26/2017   PLT 560 (H) 10/26/2017    Lab Results  Component Value Date   CREATININE 0.68 10/26/2017   BUN 8 10/26/2017   NA 144 10/26/2017   K 3.9 10/26/2017   CL 109 10/26/2017   CO2 28 10/26/2017    Lab Results  Component Value Date   ALT 9 (L) 10/20/2017   AST 22 10/20/2017   ALKPHOS 132 (H) 10/20/2017     Microbiology: Recent Results (from the past 240 hour(s))  Culture, blood (Routine x 2)     Status: None   Collection Time: 10/20/17  4:30 PM  Result Value Ref Range Status   Specimen Description   Final    BLOOD LEFT ANTECUBITAL Performed at [facility], [address] [place], [place] [address]    Special Requests   Final    BOTTLES DRAWN AEROBIC AND ANAEROBIC Blood Culture adequate volume Performed at [facility], [address], [place], [place] [address]    Culture   Final    NO GROWTH 5 DAYS Performed at [facility], [address], [place], [place] [address]    Report Status 10/25/2017 FINAL  Final  Urine culture     Status: Abnormal   Collection Time: 10/20/17  4:58 PM  Result Value Ref Range Status   Specimen Description   Final    URINE, RANDOM Performed at [facility], [address], [place], [place] [address]    Special Requests   Final    NONE Performed at [facility], [address] [place], [place] [address]    Culture (A)  Final    >=100,000 COLONIES/mL ACINETOBACTER CALCOACETICUS/BAUMANNII COMPLEX   Report Status 10/24/2017 FINAL  Final   Organism ID, Bacteria ACINETOBACTER CALCOACETICUS/BAUMANNII COMPLEX (A)  Final      Susceptibility   Acinetobacter calcoaceticus/baumannii complex - MIC*    CEFTAZIDIME >=64 RESISTANT Resistant     CEFTRIAXONE 32 INTERMEDIATE Intermediate     CIPROFLOXACIN >=4 RESISTANT Resistant     GENTAMICIN 8 INTERMEDIATE Intermediate     IMIPENEM >=16 RESISTANT Resistant     PIP/TAZO >=128 RESISTANT Resistant     TRIMETH/SULFA >=320 RESISTANT Resistant     CEFEPIME 32 RESISTANT Resistant     AMPICILLIN/SULBACTAM RESISTANT Resistant     * >=100,000 COLONIES/mL ACINETOBACTER CALCOACETICUS/BAUMANNII COMPLEX  MRSA PCR Screening     Status: Abnormal   Collection Time: 10/21/17  1:12 AM  Result Value Ref Range Status   MRSA by PCR POSITIVE (A) NEGATIVE Final    Comment:        The GeneXpert MRSA Assay (FDA approved for NASAL specimens only), is one component of a comprehensive  MRSA colonization surveillance program. It is not intended to diagnose MRSA infection nor to guide or monitor treatment for MRSA infections. RESULT CALLED TO, READ BACK BY AND VERIFIED WITH: Ranell Patrick @0525  10/21/17 MKELLY Performed at [facility], [address] [place], [place]     Impression/Plan:  1. Scrotal abscess - debrided.  On unasyn and will continue with this.   2.  Acinetobacter - in urine but not an obvious source, though on treatment as above.

## 2017-10-26 NOTE — Progress Notes (Addendum)
Physical Therapy Hydrotherapy Treatment Note  Patient Details  Name: [name] MRN: [id number] Date of Birth: 20-Feb-1951  Today's Date: 10/26/2017 Time: 8768-1157 Time Calculation (min): 48 min  Surgical PA in at beginning of session and requests hydrotherapy to continue for necrotic/slough areas of sacral pressure injury.  Will request non-woven gauze for dressing as pt with significant pain with dressing removal of woven gauze even with saline applied.   10/26/17 1400  Subjective Assessment  Subjective ohhhh, ohhh, ohhhhh  Patient and Family Stated Goals none stated  Prior Treatments dressing changes  Evaluation and Treatment  Evaluation and Treatment Procedures Explained to Patient/Family Yes  Evaluation and Treatment Procedures agreed to  Wound / Incision (Open or Dehisced) 10/22/17 Other (Comment) Sacrum Medial large wound contaminated  with BM  Date First Assessed/Time First Assessed: 10/22/17 1444   Wound Type: Other (Comment)  Location: Sacrum  Location Orientation: Medial  Wound Description (Comments): large wound contaminated  with BM  Present on Admission: Yes  Dressing Type ABD;Moist to dry;Gauze (Comment) (paper tape)  Dressing Changed Changed  Dressing Change Frequency PRN  Site / Wound Assessment Brown;Bleeding;Red;Yellow  % Wound base Red or Granulating 60%  % Wound base Yellow/Fibrinous Exudate 40%  Peri-wound Assessment Bleeding;Excoriated;Maceration  Margins Unattached edges (unapproximated)  Drainage Amount Copious  Drainage Description Green;Purulent;Serosanguineous  Treatment Debridement (Selective);Hydrotherapy (Pulse lavage);Packing (Saline gauze)  Hydrotherapy  Pulsed lavage therapy - wound location sacrum, also briefly gently cleansed scrotal wound per surgical PA request today  Pulsed Lavage with Suction (psi) 8 psi  Pulsed Lavage with Suction - Normal Saline Used 1000 mL  Pulsed Lavage Tip Tip with splash shield  Selective Debridement  Selective  Debridement - Location sacrum  Selective Debridement - Tools Used Forceps;Scalpel  Selective Debridement - Tissue Removed slough  Wound Therapy - Assess/Plan/Recommendations  Wound Therapy - Clinical Statement Pt is a 67 year old male with large painful unstageable pressure injury.  The patient requires extensive assistance for positioning. Noted R AKA. The patient has resided in SNF for several months and previously received Hydrotherapy for a sacral wound.  The patient is limited in mobility . Recommend air mattress for the patient.  The  patient will benefit from PLS. Surgical PA in during session and requests for hydrotherapy to continue.  Wound Therapy - Functional Problem List total assistance for mobility.  Factors Delaying/Impairing Wound Healing Incontinence;Multiple medical problems;Immobility  Hydrotherapy Plan Debridement;Dressing change;Patient/family education;Pulsatile lavage with suction  Wound Therapy - Frequency 6X / week  Wound Therapy - Follow Up Recommendations Skilled nursing facility  Wound Plan PLS and dressing changes  Wound Therapy Goals - Improve the function of patient's integumentary system by progressing the wound(s) through the phases of wound healing by:  Decrease Necrotic Tissue to 20  Decrease Necrotic Tissue - Progress Progressing toward goal  Increase Granulation Tissue to 80  Increase Granulation Tissue - Progress Progressing toward goal  Goals/treatment plan/discharge plan were made with and agreed upon by patient/family No, Patient unable to participate in goals/treatment/discharge plan and family unavailable  Time For Goal Achievement 2 weeks  Wound Therapy - Potential for Goals Poor   Zenovia Jarred, PT, DPT 10/26/2017 Pager: [phone]

## 2017-10-26 NOTE — Progress Notes (Signed)
Pt had 7 beat run of v tach. On call provider notified.

## 2017-10-26 NOTE — Progress Notes (Signed)
[facility]  PROGRESS NOTE  [name] NID:[id number] DOB: [birth year] DOA: 10/20/2017 PCP: [name], MD (Inactive)   Brief HPI:   67 y.o.malewith medical history significant foratrial fibrillation on Xarelto, coronary artery disease, insulin-dependent diabetes mellitus, chronic diastolic CHF, chronic low back pain, and microcytic anemia, now presenting to the emergency department from his SNF for evaluation of fevers and decreased responsiveness. Patient was discharged from the hospital 1 week ago after management of septic shock and ventilator dependent respiratory failure, suspected secondary to aspiration pneumonia and complicated by C. difficile infection. He had reportedly been recovering well at his SNF before he was noted to be listless and febrile on 4/11.  In the emergency department, he was found to be in atrial fibrillation with RVR, and concern for severe sepsis with lactic acid 4.1.  He was started on empiric vancomycin, merrem and admitted to stepdown unit. With improvement in HR, he was transitioned off cardizem gtt and transferred to telemetry unit.    Subjective   Patient seen and examined, says that the pain is better controlled. On PCA dilaudid.   Assessment/Plan:     1. 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  No clear abscess was noted per urology.  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  Blood cultures are negative to date.  Started on Solu-Cortef for suspected adrenal insufficiency in setting of hypotension.  Which is being weaned off.  PT consulted for hydrotherapy of decubitus ulcer.  Palliative care consulted for pain management started on PCA Dilaudid pump 10/23/2017. 2. UTI-urine culture grew greater than 100,000 colonies of Acinetobacter  calcoaceticus/baumannii, multidrug resistant.  ID was consulted patient started on IV Unasyn.  3. 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  Continue local wound care. 4. Atrial fibrillation with RVR-chads vas score is at least 4.  Xarelto was held on this admission for procedure.  Will restart Xarelto, also restart beta-blocker. 5. Chronic microcytic anemia-baseline hemoglobin is around 8.  Positive FOBT on admission.  Follow CBC in a.m. 6. Chronic diastolic CHF-euvolemic.  Stable 7. Diabetes mellitus-hemoglobin A1c 6.1, continue Levemir sliding scale insulin NovoLog. 8. CAD-continue Lipitor, aspirin was held, will restart aspirin.    DVT prophylaxis: SCD  Code Status: DNR  Family Communication: No family at bedside  Disposition Plan: likely home when medically ready for discharge   Consultants:  General surgery   ID  Urology  Palliative care       Procedures: None   Antibiotics:   Anti-infectives (From admission, onward)   Start     Dose/Rate Route Frequency Ordered Stop   10/24/17 1200  Ampicillin-Sulbactam (UNASYN) 3 g in sodium chloride 0.9 % 100 mL IVPB     3 g 200 mL/hr over 30 Minutes Intravenous Every 6 hours 10/24/17 1030     10/22/17 1000  vancomycin (VANCOCIN) 1,250 mg in sodium chloride 0.9 % 250 mL IVPB  Status:  Discontinued     1,250 mg 166.7 mL/hr over 90 Minutes Intravenous Every 12 hours 10/22/17 0958 10/24/17 1030   10/20/17 2200  vancomycin (VANCOCIN) IVPB 1000 mg/200 mL premix  Status:  Discontinued     1,000 mg 200 mL/hr over 60 Minutes Intravenous Every 12 hours 10/20/17 2043 10/22/17 0958   10/20/17 2200  meropenem (MERREM) 1 g in sodium chloride 0.9 % 100 mL IVPB  Status:  Discontinued     1 g 200 mL/hr over 30 Minutes Intravenous Every 8 hours 10/20/17 2043 10/24/17 1030   10/20/17 2000  vancomycin (VANCOCIN) IVPB 1000 mg/200 mL premix   Status:  Discontinued     1,000 mg 200 mL/hr over 60 Minutes Intravenous  Once 10/20/17 1956 10/20/17 2051   10/20/17 1645  piperacillin-tazobactam (ZOSYN) IVPB 3.375 g     3.375 g 100 mL/hr over 30 Minutes Intravenous  Once 10/20/17 1636 10/20/17 1830   10/20/17 1645  vancomycin (VANCOCIN) IVPB 1000 mg/200 mL premix     1,000 mg 200 mL/hr over 60 Minutes Intravenous  Once 10/20/17 1636 10/20/17 1924       Objective   Vitals:   10/26/17 0515 10/26/17 0800 10/26/17 1156 10/26/17 1335  BP: 115/75   112/76  Pulse: (!) 109   (!) 102  Resp: 11 15 18 18   Temp: 98.1 F (36.7 C)     TempSrc: Oral     SpO2: 98% 99% 98%   Weight: 112.3 kg (247 lb 9.2 oz)     Height:        Intake/Output Summary (Last 24 hours) at 10/26/2017 1643 Last data filed at 10/26/2017 1400 Gross per 24 hour  Intake 3025 ml  Output 950 ml  Net 2075 ml   Filed Weights   10/24/17 0343 10/25/17 0553 10/26/17 0515  Weight: 110.4 kg (243 lb 6.2 oz) 109.6 kg (241 lb 10 oz) 112.3 kg (247 lb 9.2 oz)     Physical Examination:   Physical Exam: Eyes: No icterus, extraocular muscles intact  Mouth: Oral mucosa is moist, no lesions on palate,  Neck: Supple, no deformities, masses, or tenderness Lungs: Normal respiratory effort, bilateral clear to auscultation, no crackles or wheezes.  Heart: Regular rate and rhythm, S1 and S2 normal, no murmurs, rubs auscultated Abdomen: BS normoactive,soft,nondistended,non-tender to palpation,no organomegaly Extremities: right AKA Neuro : Alert and oriented to time, place and person, No focal deficits       Data Reviewed: I have personally reviewed following labs and imaging studies  CBG: Recent Labs  Lab 10/25/17 2025 10/26/17 0757 10/26/17 1139 10/26/17 1213 10/26/17 1331  GLUCAP 102* 72 44* 67 103*    CBC: Recent Labs  Lab 10/20/17 1630 10/21/17 0122 10/22/17 0350 10/23/17 0443 10/24/17 0531 10/25/17 0516 10/26/17 0025  WBC 11.7* 10.8* 7.1 14.4* 17.3*  19.9* 19.9*  NEUTROABS 9.5* 9.6*  --   --   --   --   --   HGB 8.5* 7.4* 7.1* 7.8* 8.0* 8.2* 8.1*  HCT 28.3* 23.9* 23.3* 26.1* 27.2* 27.6* 28.0*  MCV 78.2 76.8* 77.9* 78.4 79.1 79.8 79.3  PLT 317 262 302 421* 506* 568* 560*    Basic Metabolic Panel: Recent Labs  Lab 10/21/17 0122 10/22/17 0350 10/23/17 0443 10/24/17 0531 10/25/17 0516 10/26/17 0025  NA 135 137 139 137 141 144  K 3.6 2.8* 3.5 3.4* 3.3* 3.9  CL 100* 102 105 103 106 109  CO2 23 26 24 25 27 28   GLUCOSE 222* 286* 265* 326* 161* 116*  BUN 12 12 11 11 9 8   CREATININE 1.00 0.73 0.74 0.81 0.80 0.68  CALCIUM 7.3* 7.6* 7.5* 7.3* 7.4* 7.2*  MG 1.6* 2.1  --   --  1.6* 1.9    Recent Results (from the past 240 hour(s))  Culture, blood (Routine x 2)     Status: None  Collection Time: 10/20/17  4:30 PM  Result Value Ref Range Status   Specimen Description   Final    BLOOD LEFT ANTECUBITAL Performed at [facility], [address], [place], [place] [address]    Special Requests   Final    BOTTLES DRAWN AEROBIC AND ANAEROBIC Blood Culture adequate volume Performed at [facility], [address], [place], [place] [address]    Culture   Final    NO GROWTH 5 DAYS Performed at [facility], [address] [place], [place] [address]    Report Status 10/25/2017 FINAL  Final  Urine culture     Status: Abnormal   Collection Time: 10/20/17  4:58 PM  Result Value Ref Range Status   Specimen Description   Final    URINE, RANDOM Performed at [facility], [address], [place], [place] [address]    Special Requests   Final    NONE Performed at [facility], [address] [place], [place] [address]    Culture (A)  Final    >=100,000 COLONIES/mL ACINETOBACTER CALCOACETICUS/BAUMANNII COMPLEX   Report Status 10/24/2017 FINAL  Final   Organism ID, Bacteria ACINETOBACTER CALCOACETICUS/BAUMANNII COMPLEX (A)  Final      Susceptibility    Acinetobacter calcoaceticus/baumannii complex - MIC*    CEFTAZIDIME >=64 RESISTANT Resistant     CEFTRIAXONE 32 INTERMEDIATE Intermediate     CIPROFLOXACIN >=4 RESISTANT Resistant     GENTAMICIN 8 INTERMEDIATE Intermediate     IMIPENEM >=16 RESISTANT Resistant     PIP/TAZO >=128 RESISTANT Resistant     TRIMETH/SULFA >=320 RESISTANT Resistant     CEFEPIME 32 RESISTANT Resistant     AMPICILLIN/SULBACTAM RESISTANT Resistant     * >=100,000 COLONIES/mL ACINETOBACTER CALCOACETICUS/BAUMANNII COMPLEX  MRSA PCR Screening     Status: Abnormal   Collection Time: 10/21/17  1:12 AM  Result Value Ref Range Status   MRSA by PCR POSITIVE (A) NEGATIVE Final    Comment:        The GeneXpert MRSA Assay (FDA approved for NASAL specimens only), is one component of a comprehensive MRSA colonization surveillance program. It is not intended to diagnose MRSA infection nor to guide or monitor treatment for MRSA infections. RESULT CALLED TO, READ BACK BY AND VERIFIED WITH: K JACKSON,RN @0525  10/21/17 MKELLY Performed at [facility], [address], [place], [place] [address]      Liver Function Tests: Recent Labs  Lab 10/20/17 1630  AST 22  ALT 9*  ALKPHOS 132*  BILITOT 0.6  PROT 6.3*  ALBUMIN 1.7*   No results for input(s): LIPASE, AMYLASE in the last 168 hours. No results for input(s): AMMONIA in the last 168 hours.  Cardiac Enzymes: No results for input(s): CKTOTAL, CKMB, CKMBINDEX, TROPONINI in the last 168 hours. BNP (last 3 results) Recent Labs    01/22/17 0509 03/14/17 0412 09/01/17 0237  BNP 183.2* 414.8* 1,401.6*    ProBNP (last 3 results) No results for input(s): PROBNP in the last 8760 hours.    Studies: Ct Abdomen Pelvis W Contrast  Result Date: 10/24/2017 CLINICAL DATA:  67 year old male with large sacral decubitus ulcer with purulent drainage from the base of the scrotum. Poorly controlled diabetes. Subsequent encounter. EXAM: CT ABDOMEN AND  PELVIS WITH CONTRAST TECHNIQUE: Multidetector CT imaging of the abdomen and pelvis was performed using the standard protocol following bolus administration of intravenous contrast. CONTRAST:  OMNIPAQUE IOHEXOL 300 MG/ML  SOLN COMPARISON:  10/11/2017 CT. FINDINGS: Lower chest: Bilateral pleural effusions with bibasilar consolidation suggestive of atelectasis with infiltrates a secondary consideration. Mild cardiomegaly.  Coronary artery calcifications. Hepatobiliary: No worrisome hepatic lesion. Fluid surrounds the gallbladder which may be related to third spacing of fluid, limiting evaluation for detection of cholecystitis. If this were of concern ultrasound may then be considered. Pancreas: No pancreatic mass or primary pancreatic inflammation. Spleen: No splenic mass.  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 Adrenals/Urinary Tract: 3.2 cm left adrenal lesion unchanged from recent CT but increased in size from 07/12/2015. It is possible this represents an adrenal adenoma however, correlation with elective adrenal MR recommended after patient's acute episodes has cleared. No obstructing stone.  No worrisome renal lesion. Partial contracted urinary bladder with circumferential wall thickening. Foley catheter in place. Stomach/Bowel: Large sacral decubitus ulcer extends adjacent to rectosigmoid region. There may be a fistula between the sacral decubitus and the rectum. From the rectosigmoid region, there may be a fistula to the scrotum contributing to large complex bilateral hydroceles/pyoceles Gas seen within the posterior superior aspect of left hydrocele/pyocele with fistula drainage to skin. Sigmoid and descending colon diverticula. Given the degree of third spacing of fluid, limited for detecting primary bowel inflammatory process. No free intraperitoneal air. Vascular/Lymphatic: Atherosclerotic changes aorta without aneurysm. Scattered reactive lymph nodes. Reproductive: No worrisome abnormality. Other: No  free intraperitoneal air. Musculoskeletal: Degenerative changes lumbar spine. Mild hip joint degenerative changes. IMPRESSION: Large sacral decubitus ulcer extends adjacent to rectosigmoid region. There may be a fistula between the sacral decubitus and the rectum. From the rectosigmoid region, there may be a fistula to the scrotum contributing to large complex bilateral hydroceles/pyoceles Gas seen within the posterior superior aspect of left hydrocele/pyocele with fistula drainage to skin. Third spacing of fluid limits evaluation for detection of cholecystitis or primary bowel inflammatory process. Bilateral pleural effusions with bibasilar consolidation suggestive of atelectasis with infiltrates a secondary consideration. Mild cardiomegaly. Coronary artery calcifications. Aortic Atherosclerosis (ICD10-I70.0). 3.2 cm left adrenal lesion unchanged from recent CT but increased in size from 07/12/2015. It is possible this represents an adrenal adenoma however, correlation with elective adrenal MR recommended after patient's acute episodes has cleared. Partial contracted urinary bladder with circumferential wall thickening. Foley catheter in place. These results will be called to the ordering clinician or representative by the Radiologist Assistant, and communication documented in the PACS or zVision Dashboard. Electronically Signed   By: Lacy Duverney M.D.   On: 10/24/2017 19:39    Scheduled Meds: . atorvastatin  80 mg Oral q1800  . calcium-vitamin D  1 tablet Oral Daily  . Chlorhexidine Gluconate Cloth  6 each Topical Q0600  . gabapentin  600 mg Oral BID  . guaiFENesin  600 mg Oral BID  . HYDROmorphone   Intravenous Q4H  . insulin aspart  0-9 Units Subcutaneous TID WC  . insulin aspart  3 Units Subcutaneous TID WC  . insulin detemir  10 Units Subcutaneous BID  . metoprolol tartrate  12.5 mg Oral BID  . multivitamin with minerals  1 tablet Oral Daily  . mupirocin ointment  1 application Nasal BID  .  pantoprazole  40 mg Oral Daily  . rivaroxaban  20 mg Oral Q supper  . sodium chloride flush  3 mL Intravenous Q12H      Time spent: 25 min  Meredeth Ide   [name] Pager [phone]. If 7PM-7AM, please contact night-coverage at [email or web address], Office  [phone]  password [email or web address]  10/26/2017, 4:43 PM  LOS:  6 days

## 2017-10-27 LAB — COMPREHENSIVE METABOLIC PANEL
ALK PHOS: 99 U/L (ref 38–126)
ALT: 33 U/L (ref 17–63)
ANION GAP: 9 (ref 5–15)
AST: 36 U/L (ref 15–41)
Albumin: 1.9 g/dL — ABNORMAL LOW (ref 3.5–5.0)
BILIRUBIN TOTAL: 0.7 mg/dL (ref 0.3–1.2)
BUN: 8 mg/dL (ref 6–20)
CALCIUM: 7.3 mg/dL — AB (ref 8.9–10.3)
CO2: 29 mmol/L (ref 22–32)
Chloride: 107 mmol/L (ref 101–111)
Creatinine, Ser: 0.68 mg/dL (ref 0.61–1.24)
GFR calc non Af Amer: >60 mL/min (ref >60)
Glucose, Bld: 149 mg/dL — ABNORMAL HIGH (ref 65–99)
POTASSIUM: 3.8 mmol/L (ref 3.5–5.1)
SODIUM: 145 mmol/L (ref 135–145)
TOTAL PROTEIN: 6.5 g/dL (ref 6.5–8.1)

## 2017-10-27 LAB — CBC
HEMATOCRIT: 30.6 % — AB (ref 39.0–52.0)
HEMOGLOBIN: 8.7 g/dL — AB (ref 13.0–17.0)
MCH: 22.8 pg — AB (ref 26.0–34.0)
MCHC: 28.4 g/dL — AB (ref 30.0–36.0)
MCV: 80.1 fL (ref 78.0–100.0)
Platelets: 596 10*3/uL — ABNORMAL HIGH (ref 150–400)
RBC: 3.82 MIL/uL — ABNORMAL LOW (ref 4.22–5.81)
RDW: 21.3 % — AB (ref 11.5–15.5)
WBC: 15.7 10*3/uL — ABNORMAL HIGH (ref 4.0–10.5)

## 2017-10-27 LAB — GLUCOSE, CAPILLARY
GLUCOSE-CAPILLARY: 49 mg/dL — AB (ref 65–99)
GLUCOSE-CAPILLARY: 88 mg/dL (ref 65–99)
Glucose-Capillary: 106 mg/dL — ABNORMAL HIGH (ref 65–99)
Glucose-Capillary: 80 mg/dL (ref 65–99)
Glucose-Capillary: 43 mg/dL — CL (ref 65–99)
Glucose-Capillary: 74 mg/dL (ref 65–99)

## 2017-10-27 MED ORDER — DAKINS (1/4 STRENGTH) 0.125 % EX SOLN
Freq: Two times a day (BID) | CUTANEOUS | Status: AC
  Administered 2017-10-27 – 2017-10-30 (×5): via TOPICAL
  Filled 2017-10-27 (×2): qty 473

## 2017-10-27 NOTE — Progress Notes (Signed)
Patient had CBG 43, wanted to drink soda and graham crackers to bring his blood sugar up, repeated CBG 49, instructed patient that I would need to give him dextrose and he refused stating it will come up after he eats his dinner

## 2017-10-27 NOTE — Progress Notes (Signed)
Per Long Length of Stay meeting-Not appropriate for LTAC-patient voiced understanding.MD updated. CSW following for SNF.

## 2017-10-27 NOTE — Progress Notes (Signed)
Daily Progress Note   Patient Name: [name]       Date: 10/27/2017 DOB: 04-Mar-1951  Age: 67 y.o. MRN#: [id number] Attending Physician: Meredeth Ide, MD Primary Care Physician: [name], MD (Inactive) Admit Date: 10/20/2017  Reason for Consultation/Follow-up: Establishing goals of care and Pain control  Subjective: [name] is upset that his breakfast isn't right.  He states that overall his pain is some what reasonably controlled We have been discussing about starting PO pain medications and PO PRN break through pain medications and de escalating from the PCA: how ever, [name] doesn't want to de escalate PCA, he states that his pain at times, is still severely uncontrolled.     PPS 30% See below.  Length of Stay: 7  Current Medications: Scheduled Meds:  . aspirin EC  81 mg Oral Daily  . atorvastatin  80 mg Oral q1800  . calcium-vitamin D  1 tablet Oral Daily  . Chlorhexidine Gluconate Cloth  6 each Topical Q0600  . gabapentin  600 mg Oral BID  . guaiFENesin  600 mg Oral BID  . HYDROmorphone   Intravenous Q4H  . insulin aspart  0-9 Units Subcutaneous TID WC  . insulin aspart  3 Units Subcutaneous TID WC  . insulin detemir  10 Units Subcutaneous BID  . metoprolol tartrate  12.5 mg Oral BID  . multivitamin with minerals  1 tablet Oral Daily  . mupirocin ointment  1 application Nasal BID  . pantoprazole  40 mg Oral Daily  . rivaroxaban  20 mg Oral Q supper  . sodium chloride flush  3 mL Intravenous Q12H    Continuous Infusions: . ampicillin-sulbactam (UNASYN) IV 3 g (10/27/17 0530)    PRN Meds: acetaminophen **OR** acetaminophen, albuterol, diphenhydrAMINE **OR** diphenhydrAMINE, HYDROmorphone (DILAUDID) injection, naloxone **AND** sodium chloride flush,  ondansetron **OR** ondansetron (ZOFRAN) IV, polyvinyl alcohol, white petrolatum  Physical Exam        .  Obese, pale in appearance  Cardiovascular: Normal rate. An irregularly irregular rhythm present.  Pulmonary/Chest: No accessory muscle usage. No tachypnea. No respiratory distress. He has decreased breath sounds.  regular breaths  Abdominal: Soft. Normal appearance.   Vital Signs: BP 122/78   Pulse 97   Temp 97.6 F (36.4 C) (Oral)   Resp 12   Ht 6' (  1.829 m)   Wt 122.8 kg (270 lb 11.6 oz)   SpO2 100%   BMI 36.72 kg/m  SpO2: SpO2: 100 % O2 Device: O2 Device: Nasal Cannula O2 Flow Rate: O2 Flow Rate (L/min): 2.5 L/min  Intake/output summary:   Intake/Output Summary (Last 24 hours) at 10/27/2017 1039 Last data filed at 10/27/2017 1610 Gross per 24 hour  Intake 1320 ml  Output 975 ml  Net 345 ml   LBM: Last BM Date: 10/24/17 Baseline Weight: Weight: 121.6 kg (268 lb) Most recent weight: Weight: 122.8 kg (270 lb 11.6 oz)       Palliative Assessment/Data:    Flowsheet Rows     Most Recent Value  Intake Tab  Referral Department  Hospitalist  Unit at Time of Referral  Med/Surg Unit  Palliative Care Primary Diagnosis  Sepsis/Infectious Disease  Date Notified  10/21/17  Palliative Care Type  Return patient Palliative Care  Reason for referral  Clarify Goals of Care, Pain  Date of Admission  10/20/17  Date first seen by Palliative Care  10/22/17  # of days Palliative referral response time  1 Day(s)  # of days IP prior to Palliative referral  1  Clinical Assessment  Palliative Performance Scale Score  50%  Pain Max last 24 hours  9  Pain Min Last 24 hours  3  Psychosocial & Spiritual Assessment  Palliative Care Outcomes      Patient Active Problem List   Diagnosis Date Noted  . Protein-calorie malnutrition, severe (HCC) 10/20/2017  . Sepsis (HCC) 10/20/2017  . Occult blood in stools 10/20/2017  . C. difficile diarrhea 10/12/2017  . Hypomagnesemia 10/12/2017   . Septic shock (HCC) 09/29/2017  . Acute encephalopathy   . Palliative care encounter   . UTI (urinary tract infection) 09/01/2017  . Acute metabolic encephalopathy 09/01/2017  . S/P AKA (above knee amputation) unilateral, right (HCC) 09/01/2017  . Atrial fibrillation with rapid ventricular response (HCC)   . Hyponatremia 04/11/2017  . ARF (acute renal failure) (HCC) 04/11/2017  . Pressure injury of skin 03/24/2017  . Below knee amputation status, right (HCC) 03/23/2017  . DKA (diabetic ketoacidoses) (HCC) 03/14/2017  . Elevated troponin 03/14/2017  . Diabetic polyneuropathy associated with type 2 diabetes mellitus (HCC)   . Osteoarthritis of both knees   . Diabetic nephropathy associated with type 2 diabetes mellitus (HCC)   . Bilateral lower leg cellulitis 01/22/2017  . Venous stasis 08/06/2016  . At high risk for falls 07/21/2016  . CHF exacerbation (HCC) 06/10/2016  . Lactic acidosis 06/09/2016  . Syncope and collapse 03/05/2016  . Acute on chronic renal insufficiency 03/05/2016  . Obesity 12/30/2015  . Scrotal swelling   . Foot fracture   . 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 Lower GI bleed   . Pressure ulcer 12/24/2015  . Acute kidney injury (HCC) 12/23/2015  . Back pain 12/23/2015  . Fall   . Metatarsal fracture   . Traumatic rhabdomyolysis (HCC)   . Hyperkalemia   . Lisfranc dislocation   . Hyperglycemia   . Sepsis due to skin infection (HCC) 09/08/2015  . Scrotal wall abscess 09/08/2015  . AKI (acute kidney injury) (HCC) 09/08/2015  . Chest wall pain   . On amiodarone therapy 12/08/2014  . Respiratory failure with hypoxia (HCC) 12/04/2014  . Hypoxia 12/04/2014  . Acute on chronic respiratory failure with hypoxia (HCC)   . Atrial fibrillation with RVR (HCC) 09/20/2014  . Chronic diastolic CHF (congestive heart  failure) (HCC) 09/19/2014  . Acute respiratory failure (HCC) 09/19/2014  . Abnormal nuclear cardiac imaging test 02/12/2014  .  Dyspnea 02/12/2014  . Chronic anticoagulation- Coumadin 02/12/2014  . CKD (chronic kidney disease) stage 3, GFR 30-59 ml/min (HCC) 02/12/2014  . Chronic diastolic congestive heart failure (HCC) 02/11/2014  . Encounter for therapeutic drug monitoring 08/03/2013  . Pilonidal cyst 01/25/2013  . Perirectal cellulitis 09/06/2012  . Long term (current) use of anticoagulants 11/18/2011  . Obstructive sleep apnea- unable to tolerate c-pap 08/04/2011  . CAD S/P percutaneous coronary angioplasty 08/04/2011  . ANEMIA-IRON DEFICIENCY 01/06/2009  . OTHER AND UNSPECIFIED COAGULATION DEFECTS 01/06/2009  . Anemia 01/02/2009  . CARDIOVASCULAR FUNCTION STUDY, ABNORMAL 01/02/2009  . Essential hypertension, benign 10/22/2008  . Atrial fibrillation 10/22/2008  . Hyperlipidemia 09/23/2008  . Hypokalemia 09/23/2008  . Obesity, Class III, BMI 40-49.9 (morbid obesity) (HCC) 09/23/2008  . CELLULITIS AND ABSCESS OF LEG EXCEPT FOOT 09/23/2008    Palliative Care Assessment & Plan   Patient Profile: 67 y.o. male  with past medical history of hypertension, hyperlipidemia, diabetes mellitus, atrial fibrillation onXarelto, OSA not on CPAP, GI bleeding,dCHF,COPD, CKD3,s/p of right AKA admitted on 10/20/2017 with sepsis, a fib, and concern for GI bleeding.  This is his 3rd admission in 6 months and palliative was involved in care during recent admission with discharge on 4/5.  Palliative consulted for goals of care and assistance with pain management.  Recommendations/Plan: -Pain: remains on PCA as per patient request.    Call placed, unable to reach sister Raiyan Dorry at this time.  Discussed with case management as well as [facility] MD: patient's goals are not comfort-only/hospice focused, at this time. He wishes to continue with IV Antibiotics, wound care and hydrotherapy. He is asking about [facility]. Case manager to request consult from [facility]/ [facility]. Await further information.   Continue current mode of care for  now. Overall goals of care and disposition remains a complex and challenging issue. PMT to continue to follow.     Code Status:    Code Status Orders  (From admission, onward)        Start     Ordered   10/20/17 1953  Do not attempt resuscitation (DNR)  Continuous    Question Answer Comment  In the event of cardiac or respiratory ARREST Do not call a "code blue"   In the event of cardiac or respiratory ARREST Do not perform Intubation, CPR, defibrillation or ACLS   In the event of cardiac or respiratory ARREST Use medication by any route, position, wound care, and other measures to relive pain and suffering. May use oxygen, suction and manual treatment of airway obstruction as needed for comfort.      10/20/17 1956    Code Status History    Date Active Date Inactive Code Status Order ID Comments User Context   09/29/2017 0457 10/15/2017 0232 Full Code [id number]  Erin Fulling, MD ED   09/08/2017 1026 09/13/2017 2008 DNR [id number]  Alwyn Ren, MD Inpatient   09/01/2017 0535 09/08/2017 1026 Full Code [id number]  Lorretta Harp, MD ED   04/11/2017 0425 04/22/2017 0201 DNR [id number]  Pearson Grippe, MD ED   03/23/2017 1511 03/28/2017 2216 DNR [id number]  Nadara Mustard, MD Inpatient   03/14/2017 0831 03/18/2017 2212 Full Code [id number]  Ozella Rocks, MD ED   03/14/2017 0831 03/14/2017 0831 Full Code [id number]  Ozella Rocks, MD ED   01/22/2017 0857 01/25/2017 2139 Full Code [id number]  Jordan Hawks,  DO Inpatient   06/10/2016 0946 06/18/2016 0030 Full Code [id number]  Therisa Doyne, MD Inpatient   12/23/2015 2049 12/30/2015 1821 DNR [id number]  Casey Burkitt, MD Inpatient   09/08/2015 2156 09/17/2015 2240 Full Code [id number]  Ron Parker, MD Inpatient   07/08/2015 2124 07/10/2015 1811 Full Code [id number]  Lorretta Harp, MD ED   12/04/2014 2303 12/11/2014 2124 Full Code [id number]  Pearson Grippe, MD Inpatient   09/19/2014 1806 09/24/2014 1845 Full Code [id number]  Dwana Melena, PA-C  Inpatient   02/11/2014 1557 02/14/2014 1629 Full Code [id number]  Rosalio Macadamia, NP Inpatient   09/07/2012 0131 09/10/2012 1731 Full Code [id number]  Eduard Clos, MD Inpatient       Prognosis:   Unable to determine  Discharge Planning:  To Be Determined  Care plan was discussed with patient,     Thank you for allowing the Palliative Medicine Team to assist in the care of this patient.   Total Time 25 Prolonged Time Billed No      Greater than 50%  of this time was spent counseling and coordinating care related to the above assessment and plan.  Rosalin Hawking, MD [phone] Please contact Palliative Medicine Team phone at [phone] for questions and concerns.

## 2017-10-27 NOTE — Plan of Care (Signed)
  Problem: Health Behavior/Discharge Planning: Goal: Ability to manage health-related needs will improve Outcome: Progressing   Problem: Nutrition: Goal: Adequate nutrition will be maintained Outcome: Progressing   Problem: Coping: Goal: Level of anxiety will decrease Outcome: Progressing   Problem: Elimination: Goal: Will not experience complications related to bowel motility Outcome: Progressing Goal: Will not experience complications related to urinary retention Outcome: Progressing   Problem: Safety: Goal: Ability to remain free from injury will improve Outcome: Progressing   Problem: Skin Integrity: Goal: Risk for impaired skin integrity will decrease Outcome: Progressing

## 2017-10-27 NOTE — Progress Notes (Signed)
[facility]  PROGRESS NOTE  Paul GANSER [name]:[id number] DOB: 1950-08-09 DOA: 10/20/2017 PCP: [name], MD (Inactive)   Brief HPI:   67 y.o.malewith medical history significant foratrial fibrillation on Xarelto, coronary artery disease, insulin-dependent diabetes mellitus, chronic diastolic CHF, chronic low back pain, and microcytic anemia, now presenting to the emergency department from his SNF for evaluation of fevers and decreased responsiveness. Patient was discharged from the hospital 1 week ago after management of septic shock and ventilator dependent respiratory failure, suspected secondary to aspiration pneumonia and complicated by C. difficile infection. He had reportedly been recovering well at his SNF before he was noted to be listless and febrile on 4/11.  In the emergency department, he was found to be in atrial fibrillation with RVR, and concern for severe sepsis with lactic acid 4.1.  He was started on empiric vancomycin, merrem and admitted to stepdown unit. With improvement in HR, he was transitioned off cardizem gtt and transferred to telemetry unit.    Subjective   Patient seen and examined, pain is controlled.  Currently on PCA with Dilaudid.   Assessment/Plan:     1. 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  No clear abscess was noted per urology.  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  Blood cultures are negative to date.  Started on Solu-Cortef for suspected adrenal insufficiency in setting of hypotension.  Which is being weaned off.  PT consulted for hydrotherapy of decubitus ulcer.  Palliative care consulted for pain management started on PCA Dilaudid pump 10/23/2017. 2. UTI-urine culture grew greater than 100,000 colonies of Acinetobacter  calcoaceticus/baumannii, multidrug resistant.  ID was consulted patient started on IV Unasyn.  Unasyn will be discontinued as it was supposed to given for 3 days only.  3. 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  Continue local wound care. 4. Atrial fibrillation with RVR-chads vas score is at least 4.  Xarelto was held on this admission for procedure.  Restarted Xarelto, metoprolol 12.5 mg p.o. twice daily 5. Chronic microcytic anemia-baseline hemoglobin is around 8.  Positive FOBT on admission.  Follow CBC in a.m. 6. Chronic diastolic CHF-euvolemic.  Stable 7. Diabetes mellitus-hemoglobin A1c 6.1, continue Levemir sliding scale insulin NovoLog. 8. CAD-continue Lipitor, aspirin was held, aspirin has been restarted    DVT prophylaxis: SCD  Code Status: DNR  Family Communication: No family at bedside  Disposition Plan: likely skilled facility in next 24 to 48 hours   Consultants:  General surgery   ID  Urology  Palliative care    Procedures: None   Antibiotics:   Anti-infectives (From admission, onward)   Start     Dose/Rate Route Frequency Ordered Stop   10/24/17 1200  Ampicillin-Sulbactam (UNASYN) 3 g in sodium chloride 0.9 % 100 mL IVPB     3 g 200 mL/hr over 30 Minutes Intravenous Every 6 hours 10/24/17 1030 10/27/17 1216   10/22/17 1000  vancomycin (VANCOCIN) 1,250 mg in sodium chloride 0.9 % 250 mL IVPB  Status:  Discontinued     1,250 mg 166.7 mL/hr over 90 Minutes Intravenous Every 12 hours 10/22/17 0958 10/24/17 1030   10/20/17 2200  vancomycin (VANCOCIN) IVPB 1000 mg/200 mL premix  Status:  Discontinued     1,000 mg 200 mL/hr  over 60 Minutes Intravenous Every 12 hours 10/20/17 2043 10/22/17 0958   10/20/17 2200  meropenem (MERREM) 1 g in sodium chloride 0.9 % 100 mL IVPB  Status:  Discontinued     1 g 200 mL/hr over 30 Minutes Intravenous Every 8 hours  10/20/17 2043 10/24/17 1030   10/20/17 2000  vancomycin (VANCOCIN) IVPB 1000 mg/200 mL premix  Status:  Discontinued     1,000 mg 200 mL/hr over 60 Minutes Intravenous  Once 10/20/17 1956 10/20/17 2051   10/20/17 1645  piperacillin-tazobactam (ZOSYN) IVPB 3.375 g     3.375 g 100 mL/hr over 30 Minutes Intravenous  Once 10/20/17 1636 10/20/17 1830   10/20/17 1645  vancomycin (VANCOCIN) IVPB 1000 mg/200 mL premix     1,000 mg 200 mL/hr over 60 Minutes Intravenous  Once 10/20/17 1636 10/20/17 1924       Objective   Vitals:   10/27/17 0631 10/27/17 0807 10/27/17 1146 10/27/17 1259  BP: 122/78   123/67  Pulse: 97   95  Resp: 10 12 13 16   Temp: 97.6 F (36.4 C)   97.8 F (36.6 C)  TempSrc: Oral   Oral  SpO2: 100% 100% 100% 100%  Weight: 122.8 kg (270 lb 11.6 oz)     Height:        Intake/Output Summary (Last 24 hours) at 10/27/2017 1333 Last data filed at 10/27/2017 0649 Gross per 24 hour  Intake 1320 ml  Output 975 ml  Net 345 ml   Filed Weights   10/25/17 0553 10/26/17 0515 10/27/17 0631  Weight: 109.6 kg (241 lb 10 oz) 112.3 kg (247 lb 9.2 oz) 122.8 kg (270 lb 11.6 oz)     Physical Examination:    Physical Exam: Eyes: No icterus, extraocular muscles intact  Mouth: Oral mucosa is moist, no lesions on palate,  Neck: Supple, no deformities, masses, or tenderness Lungs: Normal respiratory effort, bilateral clear to auscultation, no crackles or wheezes.  Heart: Regular rate and rhythm, S1 and S2 normal, no murmurs, rubs auscultated Abdomen: BS normoactive,soft,nondistended,non-tender to palpation,no organomegaly Extremities: Right AKA Neuro : Alert and oriented to time, place and person, No focal deficits Skin: No rashes seen on exam    Data Reviewed: I have personally reviewed following labs and imaging studies  CBG: Recent Labs  Lab 10/26/17 1331 10/26/17 1654 10/26/17 2101 10/27/17 0745 10/27/17 1204  GLUCAP 103* 108* 135* 106* 80    CBC: Recent Labs   Lab 10/20/17 1630 10/21/17 0122  10/23/17 0443 10/24/17 0531 10/25/17 0516 10/26/17 0025 10/27/17 0521  WBC 11.7* 10.8*   < > 14.4* 17.3* 19.9* 19.9* 15.7*  NEUTROABS 9.5* 9.6*  --   --   --   --   --   --   HGB 8.5* 7.4*   < > 7.8* 8.0* 8.2* 8.1* 8.7*  HCT 28.3* 23.9*   < > 26.1* 27.2* 27.6* 28.0* 30.6*  MCV 78.2 76.8*   < > 78.4 79.1 79.8 79.3 80.1  PLT 317 262   < > 421* 506* 568* 560* 596*   < > = values in this interval not displayed.    Basic Metabolic Panel: Recent Labs  Lab 10/21/17 0122 10/22/17 0350 10/23/17 0443 10/24/17 0531 10/25/17 0516 10/26/17 0025 10/27/17 0521  NA 135 137 139 137 141 144 145  K 3.6 2.8* 3.5 3.4* 3.3* 3.9 3.8  CL 100* 102 105 103 106 109 107  CO2 23 26 24 25 27 28 29   GLUCOSE 222* 286*  265* 326* 161* 116* 149*  BUN 12 12 11 11 9 8 8   CREATININE 1.00 0.73 0.74 0.81 0.80 0.68 0.68  CALCIUM 7.3* 7.6* 7.5* 7.3* 7.4* 7.2* 7.3*  MG 1.6* 2.1  --   --  1.6* 1.9  --     Recent Results (from the past 240 hour(s))  Culture, blood (Routine x 2)     Status: None   Collection Time: 10/20/17  4:30 PM  Result Value Ref Range Status   Specimen Description   Final    BLOOD LEFT ANTECUBITAL Performed at [facility], [address], [place], [place]    Special Requests   Final    BOTTLES DRAWN AEROBIC AND ANAEROBIC Blood Culture adequate volume Performed at [facility], [address], [place], [place]    Culture   Final    NO GROWTH 5 DAYS Performed at [facility], [address] [place], [place]    Report Status 10/25/2017 FINAL  Final  Urine culture     Status: Abnormal   Collection Time: 10/20/17  4:58 PM  Result Value Ref Range Status   Specimen Description   Final    URINE, RANDOM Performed at [facility], [address], [place], [place]    Special Requests   Final    NONE Performed at [facility], [address] [place], [place]    Culture (A)  Final    >=100,000 COLONIES/mL ACINETOBACTER CALCOACETICUS/BAUMANNII COMPLEX   Report Status 10/24/2017 FINAL  Final   Organism ID, Bacteria ACINETOBACTER CALCOACETICUS/BAUMANNII COMPLEX (A)  Final      Susceptibility   Acinetobacter calcoaceticus/baumannii complex - MIC*    CEFTAZIDIME >=64 RESISTANT Resistant     CEFTRIAXONE 32 INTERMEDIATE Intermediate     CIPROFLOXACIN >=4 RESISTANT Resistant     GENTAMICIN 8 INTERMEDIATE Intermediate     IMIPENEM >=16 RESISTANT Resistant     PIP/TAZO >=128 RESISTANT Resistant     TRIMETH/SULFA >=320 RESISTANT Resistant     CEFEPIME 32 RESISTANT Resistant     AMPICILLIN/SULBACTAM RESISTANT Resistant     * >=100,000 COLONIES/mL ACINETOBACTER CALCOACETICUS/BAUMANNII COMPLEX  MRSA PCR Screening     Status: Abnormal   Collection Time: 10/21/17  1:12 AM  Result Value Ref Range Status   MRSA by PCR POSITIVE (A) NEGATIVE Final    Comment:        The GeneXpert MRSA Assay (FDA approved for NASAL specimens only), is one component of a comprehensive MRSA colonization surveillance program. It is not intended to diagnose MRSA infection nor to guide or monitor treatment for MRSA infections. RESULT CALLED TO, READ BACK BY AND VERIFIED WITH: K JACKSON,RN @0525  10/21/17 MKELLY Performed at [facility], [address] [place], [place]      Liver Function Tests: Recent Labs  Lab 10/20/17 1630 10/27/17 0521  AST 22 36  ALT 9* 33  ALKPHOS 132* 99  BILITOT 0.6 0.7  PROT 6.3* 6.5  ALBUMIN 1.7* 1.9*   No results for input(s): LIPASE, AMYLASE in the last 168 hours. No results for input(s): AMMONIA in the last 168 hours.  Cardiac Enzymes: No results for input(s): CKTOTAL, CKMB, CKMBINDEX, TROPONINI in the last 168 hours. BNP (last 3 results) Recent Labs    01/22/17 0509 03/14/17 0412 09/01/17 0237  BNP 183.2* 414.8* 1,401.6*    ProBNP (last 3 results) No  results for input(s): PROBNP in  the last 8760 hours.    Studies: No results found.  Scheduled Meds: . aspirin EC  81 mg Oral Daily  . atorvastatin  80 mg Oral q1800  . calcium-vitamin D  1 tablet Oral Daily  . Chlorhexidine Gluconate Cloth  6 each Topical Q0600  . gabapentin  600 mg Oral BID  . guaiFENesin  600 mg Oral BID  . HYDROmorphone   Intravenous Q4H  . insulin aspart  0-9 Units Subcutaneous TID WC  . insulin aspart  3 Units Subcutaneous TID WC  . insulin detemir  10 Units Subcutaneous BID  . metoprolol tartrate  12.5 mg Oral BID  . multivitamin with minerals  1 tablet Oral Daily  . mupirocin ointment  1 application Nasal BID  . pantoprazole  40 mg Oral Daily  . rivaroxaban  20 mg Oral Q supper  . sodium chloride flush  3 mL Intravenous Q12H      Time spent: 25 min  Meredeth Ide   [name] Pager [phone]. If 7PM-7AM, please contact night-coverage at [email or web address], Office  [phone]  password [email or web address]  10/27/2017, 1:33 PM  LOS: 7 days

## 2017-10-27 NOTE — Progress Notes (Signed)
   10/27/17 1400 Hydrotherapy treatment note.  [id number]  Subjective Assessment  Subjective oh it hurts, I need more medicine( RN brought IV meds in addition to PCA)  Patient and Family Stated Goals no pain  Prior Treatments dressing change  Evaluation and Treatment  Evaluation and Treatment Procedures Explained to Patient/Family Yes  Evaluation and Treatment Procedures agreed to  Wound / Incision (Open or Dehisced) 10/22/17 Other (Comment) Sacrum Medial large wound contaminated  with BM  Date First Assessed/Time First Assessed: 10/22/17 1444   Wound Type: Other (Comment)  Location: Sacrum  Location Orientation: Medial  Wound Description (Comments): large wound contaminated  with BM  Present on Admission: Yes  Dressing Type Moist to dry;Gauze (Comment) (used GUAZE THAT IS STRETCHY)  Dressing Status Clean;Dry;Intact  Dressing Change Frequency Twice a day  Site / Wound Assessment Granulation tissue;Painful;Pink;Yellow (greenish drainage, malodorous)  % Wound base Red or Granulating 30%  % Wound base Yellow/Fibrinous Exudate 70%  Peri-wound Assessment Excoriated  Wound Length (cm) 12 cm  Wound Width (cm) 15 cm  Wound Depth (cm) 2 cm  Wound Volume (cm^3) 360 cm^3  Wound Surface Area (cm^2) 180 cm^2  Margins Unattached edges (unapproximated)  Drainage Amount Copious  Drainage Description Purulent;Green;Odor  Non-staged Wound Description Full thickness  Treatment Cleansed;Hydrotherapy (Pulse lavage);Packing (Saline gauze)  Hydrotherapy  Pulsed lavage therapy - wound location sacrum, also briefly gently cleansed scrotal wound per surgical PA request today  Pulsed Lavage with Suction (psi) 8 psi  Pulsed Lavage with Suction - Normal Saline Used 1000 mL  Pulsed Lavage Tip Tip with splash shield  Selective Debridement  Selective Debridement - Location sacrum (and scrotum)  Wound Therapy - Assess/Plan/Recommendations  Wound Therapy - Clinical Statement Pt is a 67 year old male with large  painful unstageable pressure injury.  The patient requires extensive assistance for positioning. Noted R AKA. The patient has resided in SNF for several months and previously received Hydrotherapy for a sacral wound.  The patient is limited in mobility . The patient is on an air mattress. The  patient will benefit from PLS. Surgical PA paged but unable to arrive before treatment ended and dressing intact. Informed PA of green and malodorous drainage on old dressing. Ordered  to start Dakins again.  Gentle hydro and  light packing of the scrotal site   Wound Therapy - Functional Problem List total assistance for mobility.  Factors Delaying/Impairing Wound Healing Incontinence;Multiple medical problems;Immobility  Hydrotherapy Plan Debridement;Dressing change;Patient/family education;Pulsatile lavage with suction  Wound Therapy - Frequency 6X / week  Wound Therapy - Follow Up Recommendations Skilled nursing facility  Wound Plan PLS and dressing changes  Wound Therapy Goals - Improve the function of patient's integumentary system by progressing the wound(s) through the phases of wound healing by:  Decrease Necrotic Tissue to 20  Decrease Necrotic Tissue - Progress Progressing toward goal  Increase Granulation Tissue to 80  Increase Granulation Tissue - Progress Progressing toward goal  Time For Goal Achievement 2 weeks  Wound Therapy - Potential for Goals Poor  [name] PT [phone]

## 2017-10-27 NOTE — Anesthesia Postprocedure Evaluation (Signed)
Anesthesia Post Note  Patient: [name]  Procedure(s) Performed: DEBRIDEMENT SACRAL DECUBITUS skin, subcutaneous tissue and fascia. Incision drainage and debridement skin and subcutaneous tissue scrotum. (N/A )     Patient location during evaluation: PACU Anesthesia Type: General Level of consciousness: awake and alert Pain management: pain level controlled Vital Signs Assessment: post-procedure vital signs reviewed and stable Respiratory status: spontaneous breathing, nonlabored ventilation, respiratory function stable and patient connected to nasal cannula oxygen Cardiovascular status: blood pressure returned to baseline and stable Postop Assessment: no apparent nausea or vomiting Anesthetic complications: no    Last Vitals:  Vitals:   10/27/17 0529 10/27/17 0631  BP:  122/78  Pulse:  97  Resp: 11 10  Temp:  36.4 C  SpO2: 92% 100%    Last Pain:  Vitals:   10/27/17 0631  TempSrc: Oral  PainSc:                  Rickey Sadowski S

## 2017-10-27 NOTE — Progress Notes (Signed)
2 Days Post-Op    CC:  decubitus  Subjective: We missed the dressing change, did not get called.  They are looking into SNF, but he is still on a PCA with supplements for the dressing change.  He is more comfortable in the bariatric bed.    Objective: Vital signs in last 24 hours: Temp:  [97.6 F (36.4 C)-98.2 F (36.8 C)] 97.6 F (36.4 C) (04/18 0631) Pulse Rate:  [95-108] 97 (04/18 0631) Resp:  [8-18] 12 (04/18 0807) BP: (112-122)/(73-78) 122/78 (04/18 0631) SpO2:  [92 %-100 %] 100 % (04/18 0807) Weight:  [122.8 kg (270 lb 11.6 oz)] 122.8 kg (270 lb 11.6 oz) (04/18 0631) Last BM Date: 10/24/17  Intake/Output from previous day: 04/17 0701 - 04/18 0700 In: 1840 [P.O.:840; I.V.:800; IV Piggyback:200] Out: 975 [Urine:975] Intake/Output this shift: No intake/output data recorded.  General appearance: alert, cooperative and no distress Skin: Skin color, texture, turgor normal. No rashes or lesions or Hydro Rx reports some green again in the decubitus,but coming along otherwise.  scrotum is OK.  Lab Results:  Recent Labs    10/26/17 0025 10/27/17 0521  WBC 19.9* 15.7*  HGB 8.1* 8.7*  HCT 28.0* 30.6*  PLT 560* 596*    BMET Recent Labs    10/26/17 0025 10/27/17 0521  NA 144 145  K 3.9 3.8  CL 109 107  CO2 28 29  GLUCOSE 116* 149*  BUN 8 8  CREATININE 0.68 0.68  CALCIUM 7.2* 7.3*   PT/INR No results for input(s): LABPROT, INR in the last 72 hours.  Recent Labs  Lab 10/20/17 1630 10/27/17 0521  AST 22 36  ALT 9* 33  ALKPHOS 132* 99  BILITOT 0.6 0.7  PROT 6.3* 6.5  ALBUMIN 1.7* 1.9*     Lipase  No results found for: LIPASE   Medications: . aspirin EC  81 mg Oral Daily  . atorvastatin  80 mg Oral q1800  . calcium-vitamin D  1 tablet Oral Daily  . Chlorhexidine Gluconate Cloth  6 each Topical Q0600  . gabapentin  600 mg Oral BID  . guaiFENesin  600 mg Oral BID  . HYDROmorphone   Intravenous Q4H  . insulin aspart  0-9 Units Subcutaneous TID WC  .  insulin aspart  3 Units Subcutaneous TID WC  . insulin detemir  10 Units Subcutaneous BID  . metoprolol tartrate  12.5 mg Oral BID  . multivitamin with minerals  1 tablet Oral Daily  . mupirocin ointment  1 application Nasal BID  . pantoprazole  40 mg Oral Daily  . rivaroxaban  20 mg Oral Q supper  . 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 Active Problems: ANEMIA-IRON DEFICIENCY CAD S/P percutaneous coronary angioplasty Chronic diastolic CHF (congestive heart failure) (HCC) Pressure ulcer DM type 2 with diabetic background retinopathy- poor control Atrial fibrillation with rapid ventricular response- on Xarelto Acute encephalopathy Protein-calorie malnutrition, severe  Sepsis  Occult blood in stools Anemia Severe deconditioning Hx of tobacco use  Sacral decub - pt lives at [facility] and states he has not been out of bed since last hospital admission - last photo of bottom was 08/2017  - do not suspect that this is the cause of his sepsis - nurse mentioned that he has had a foley in since last admission and was not sure when it was placed.   - DEBRIDEMENT SACRAL DECUBITUS skin, subcutaneous tissue and  fascia. Incision drainage and       debridement skin and subcutaneous tissue scrotum, 10/25/17, Dr. 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 Hadley Pen  - will review with Dr. Johna Sheriff, but I think we need to get a repeat CT and may need to include both thighs to see to the base of this.    Examination under anesthesia, Excisional debridement of skin and subcutaneous tissue of the scrotum,       approximately 3 cm x 3 cm, Excision of fistulous tract,10/25/17 Dr. Modena Slater  POD2  FEN: Carb Mod ID: Vancomycin 4/11 =>>day 4; Zosyn x 1 dose; Meropenum 4/11 - 4/15; Unasyn  =>>day  4 DVT: SCD's only Foley: In Follow up:Wound clinic   Plan:  Continue BID dressing changes and add some Dakin's for the weekend.  We will see again Monday.          LOS: 7 days    [name] 10/27/[id number]

## 2017-10-28 LAB — CBC
HCT: 30.7 % — ABNORMAL LOW (ref 39.0–52.0)
Hemoglobin: 9.1 g/dL — ABNORMAL LOW (ref 13.0–17.0)
MCH: 23.5 pg — ABNORMAL LOW (ref 26.0–34.0)
MCHC: 29.6 g/dL — ABNORMAL LOW (ref 30.0–36.0)
MCV: 79.3 fL (ref 78.0–100.0)
PLATELETS: 645 10*3/uL — AB (ref 150–400)
RBC: 3.87 MIL/uL — ABNORMAL LOW (ref 4.22–5.81)
RDW: 21.8 % — ABNORMAL HIGH (ref 11.5–15.5)
WBC: 18.5 10*3/uL — AB (ref 4.0–10.5)

## 2017-10-28 LAB — GLUCOSE, CAPILLARY
GLUCOSE-CAPILLARY: 61 mg/dL — AB (ref 65–99)
GLUCOSE-CAPILLARY: 133 mg/dL — AB (ref 65–99)
GLUCOSE-CAPILLARY: 121 mg/dL — AB (ref 65–99)
Glucose-Capillary: 299 mg/dL — ABNORMAL HIGH (ref 65–99)
Glucose-Capillary: 144 mg/dL — ABNORMAL HIGH (ref 65–99)

## 2017-10-28 LAB — CREATININE, SERUM
CREATININE: 0.68 mg/dL (ref 0.61–1.24)
GFR calc Af Amer: >60 mL/min (ref >60)

## 2017-10-28 NOTE — Progress Notes (Signed)
PT Cancellation Note  Patient Details Name: [name] MRN: [id number] DOB: 05-Oct-1950   Cancelled Treatment:    Reason Eval/Treat Not Completed: Pain limiting ability to participate, RN offered extra pain medication  Prior to hydro treatment, patient refusing at this time. RN will chang dressing later.   Rada Hay 10/28/2017, 2:21 PM Blanchard Kelch PT [phone]

## 2017-10-28 NOTE — Progress Notes (Signed)
CSW spoke with patient's sister regarding discharge planning for patient. Patient's sister confirmed plan for patient to discharge back to [facility]. Patient's sister reported that she has paid patient's outstanding balance at [facility] and agreed to complete patient's [facility] term care Medicaid application.   CSW spoke with admissions staff Clydie Braun at [facility] who confirmed that patient can return to their SNF at discharge.  CSW will continue to follow and assist with discharge planning.  Celso Sickle, [place] Clinical Social Worker [facility] Cell#: [phone]

## 2017-10-28 NOTE — Progress Notes (Signed)
[facility] may posibbley be an option-[facility] rep is following. Will need PT to document wound measurements with hydrotherapy-PT notified.

## 2017-10-28 NOTE — Progress Notes (Signed)
Report received from Amy Abernathy,RN. No change in assessment. Mikya Don Johnson 

## 2017-10-28 NOTE — Progress Notes (Signed)
3 Days Post-Op Subjective: Patient reports mild scrotal pain  Objective: Vital signs in last 24 hours: Temp:  [98.3 F (36.8 C)-99.2 F (37.3 C)] 99.2 F (37.3 C) (04/19 1310) Pulse Rate:  [96-122] 96 (04/19 1310) Resp:  [10-20] 10 (04/19 1540) BP: (104-143)/(69-90) 127/69 (04/19 1310) SpO2:  [90 %-100 %] 100 % (04/19 1540) Weight:  [122.7 kg (270 lb 8.1 oz)] 122.7 kg (270 lb 8.1 oz) (04/19 0533)  Intake/Output from previous day: 04/18 0701 - 04/19 0700 In: -  Out: 1400 [Urine:1400] Intake/Output this shift: No intake/output data recorded.  Physical Exam:  General:alert, cooperative and appears stated age GI: soft, non tender, normal bowel sounds, no palpable masses, no organomegaly, no inguinal hernia Male genitalia: no penile lesions or discharge moderate scrotal edema none  Lab Results: Recent Labs    10/26/17 0025 10/27/17 0521 10/28/17 0547  HGB 8.1* 8.7* 9.1*  HCT 28.0* 30.6* 30.7*   BMET Recent Labs    10/26/17 0025 10/27/17 0521 10/28/17 0547  NA 144 145  --   K 3.9 3.8  --   CL 109 107  --   CO2 28 29  --   GLUCOSE 116* 149*  --   BUN 8 8  --   CREATININE 0.68 0.68 0.68  CALCIUM 7.2* 7.3*  --    No results for input(s): LABPT, INR in the last 72 hours. No results for input(s): LABURIN in the last 72 hours. Results for orders placed or performed during the hospital encounter of 10/20/17  Culture, blood (Routine x 2)     Status: None   Collection Time: 10/20/17  4:30 PM  Result Value Ref Range Status   Specimen Description   Final    BLOOD LEFT ANTECUBITAL Performed at [facility], [address] [place], [place] [address]    Special Requests   Final    BOTTLES DRAWN AEROBIC AND ANAEROBIC Blood Culture adequate volume Performed at [facility], [address] [place], [place] [address]    Culture   Final    NO GROWTH 5 DAYS Performed at [facility], [address], [place], [place] [address]     Report Status 10/25/2017 FINAL  Final  Urine culture     Status: Abnormal   Collection Time: 10/20/17  4:58 PM  Result Value Ref Range Status   Specimen Description   Final    URINE, RANDOM Performed at [facility], [address] [place], [place] [address]    Special Requests   Final    NONE Performed at [facility], [address], [place], [place] [address]    Culture (A)  Final    >=100,000 COLONIES/mL ACINETOBACTER CALCOACETICUS/BAUMANNII COMPLEX   Report Status 10/24/2017 FINAL  Final   Organism ID, Bacteria ACINETOBACTER CALCOACETICUS/BAUMANNII COMPLEX (A)  Final      Susceptibility   Acinetobacter calcoaceticus/baumannii complex - MIC*    CEFTAZIDIME >=64 RESISTANT Resistant     CEFTRIAXONE 32 INTERMEDIATE Intermediate     CIPROFLOXACIN >=4 RESISTANT Resistant     GENTAMICIN 8 INTERMEDIATE Intermediate     IMIPENEM >=16 RESISTANT Resistant     PIP/TAZO >=128 RESISTANT Resistant     TRIMETH/SULFA >=320 RESISTANT Resistant     CEFEPIME 32 RESISTANT Resistant     AMPICILLIN/SULBACTAM RESISTANT Resistant     * >=100,000 COLONIES/mL ACINETOBACTER CALCOACETICUS/BAUMANNII COMPLEX  MRSA PCR Screening     Status: Abnormal   Collection Time: 10/21/17  1:12  AM  Result Value Ref Range Status   MRSA by PCR POSITIVE (A) NEGATIVE Final    Comment:        The GeneXpert MRSA Assay (FDA approved for NASAL specimens only), is one component of a comprehensive MRSA colonization surveillance program. It is not intended to diagnose MRSA infection nor to guide or monitor treatment for MRSA infections. RESULT CALLED TO, READ BACK BY AND VERIFIED WITH: Ranell Traeton Bordas @0525  10/21/17 MKELLY Performed at [facility], [address], [place], [place]     Studies/Results: No results found.  Assessment/Plan: 67yo with perineal wound and scrotal edema  1. Please continue local wound care to perineum 2. Please place  rolled towel under scrotum for scrotal support to decrease edema   LOS: 8 days   [id number] 10/28/2017, 7:49 PM

## 2017-10-28 NOTE — Progress Notes (Addendum)
[facility]  PROGRESS NOTE  [name] JOI:[id number] DOB: 1950/11/20 DOA: 10/20/2017 PCP: [name], MD (Inactive)   Brief HPI:   67 y.o.malewith medical history significant foratrial fibrillation on Xarelto, coronary artery disease, insulin-dependent diabetes mellitus, chronic diastolic CHF, chronic low back pain, and microcytic anemia, now presenting to the emergency department from his SNF for evaluation of fevers and decreased responsiveness. Patient was discharged from the hospital 1 week ago after management of septic shock and ventilator dependent respiratory failure, suspected secondary to aspiration pneumonia and complicated by C. difficile infection. He had reportedly been recovering well at his SNF before he was noted to be listless and febrile on 4/11.  In the emergency department, he was found to be in atrial fibrillation with RVR, and concern for severe sepsis with lactic acid 4.1.  He was started on empiric vancomycin, merrem and admitted to stepdown unit. With improvement in HR, he was transitioned off cardizem gtt and transferred to telemetry unit.    Subjective   Patient seen and examined, continues to complain of pain with hydrotherapy, on Dilaudid pca.   Assessment/Plan:     1. 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  No clear abscess was noted per urology.  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  Blood cultures are negative to date.  Started on Solu-Cortef for suspected adrenal insufficiency in setting of hypotension.  Which is being weaned off.  PT consulted for hydrotherapy of decubitus ulcer.  Palliative care consulted for pain management started on PCA Dilaudid pump 10/23/2017. 2. UTI-urine culture grew greater than 100,000 colonies  of Acinetobacter calcoaceticus/baumannii, multidrug resistant.  ID was consulted patient started on IV Unasyn.  Unasyn will be discontinued as it was supposed to given for 3 days only. WBc is elevated to 18000, follow cbc in am.  3. 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  Continue local wound care. Discussed with Dr Luciana Axe, no further antibiotics needed, as the debridement did not find pyocele in the scrotum. 4. Atrial fibrillation with RVR-chads vas score is at least 4.  Xarelto was held on this admission for procedure.  Restarted Xarelto, metoprolol 12.5 mg p.o. twice daily 5. Chronic microcytic anemia-baseline hemoglobin is around 8.  Positive FOBT on admission.  Follow CBC in a.m. 6. Chronic diastolic CHF-euvolemic.  Stable 7. Diabetes mellitus-hemoglobin A1c 6.1, continue Levemir sliding scale insulin NovoLog. 8. CAD-continue Lipitor, aspirin was held, aspirin has been restarted    DVT prophylaxis: SCD  Code Status: DNR  Family Communication: No family at bedside  Disposition Plan: likely skilled facility in next 24 to 48 hours   Consultants:  General surgery   ID  Urology  Palliative care    Procedures: None   Antibiotics:   Anti-infectives (From admission, onward)   Start     Dose/Rate Route Frequency Ordered Stop   10/24/17 1200  Ampicillin-Sulbactam (UNASYN) 3 g in sodium chloride 0.9 % 100 mL IVPB     3 g 200 mL/hr over 30 Minutes Intravenous Every 6 hours 10/24/17 1030 10/27/17 1217   10/22/17 1000  vancomycin (VANCOCIN) 1,250 mg in sodium chloride 0.9 % 250 mL IVPB  Status:  Discontinued     1,250 mg 166.7 mL/hr over 90 Minutes Intravenous Every  12 hours 10/22/17 0958 10/24/17 1030   10/20/17 2200  vancomycin (VANCOCIN) IVPB 1000 mg/200 mL premix  Status:  Discontinued     1,000 mg 200 mL/hr over 60 Minutes Intravenous Every 12 hours 10/20/17 2043 10/22/17  0958   10/20/17 2200  meropenem (MERREM) 1 g in sodium chloride 0.9 % 100 mL IVPB  Status:  Discontinued     1 g 200 mL/hr over 30 Minutes Intravenous Every 8 hours 10/20/17 2043 10/24/17 1030   10/20/17 2000  vancomycin (VANCOCIN) IVPB 1000 mg/200 mL premix  Status:  Discontinued     1,000 mg 200 mL/hr over 60 Minutes Intravenous  Once 10/20/17 1956 10/20/17 2051   10/20/17 1645  piperacillin-tazobactam (ZOSYN) IVPB 3.375 g     3.375 g 100 mL/hr over 30 Minutes Intravenous  Once 10/20/17 1636 10/20/17 1830   10/20/17 1645  vancomycin (VANCOCIN) IVPB 1000 mg/200 mL premix     1,000 mg 200 mL/hr over 60 Minutes Intravenous  Once 10/20/17 1636 10/20/17 1924       Objective   Vitals:   10/28/17 0839 10/28/17 1245 10/28/17 1310 10/28/17 1540  BP:   127/69   Pulse:   96   Resp: 18 14 18 10   Temp:   99.2 F (37.3 C)   TempSrc:   Oral   SpO2: 90% 100%  100%  Weight:      Height:        Intake/Output Summary (Last 24 hours) at 10/28/2017 1546 Last data filed at 10/28/2017 1400 Gross per 24 hour  Intake 680 ml  Output 1400 ml  Net -720 ml   Filed Weights   10/26/17 0515 10/27/17 0631 10/28/17 0533  Weight: 112.3 kg (247 lb 9.2 oz) 122.8 kg (270 lb 11.6 oz) 122.7 kg (270 lb 8.1 oz)     Physical Examination:  Physical Exam: Eyes: No icterus, extraocular muscles intact  Mouth: Oral mucosa is moist, no lesions on palate,  Neck: Supple, no deformities, masses, or tenderness Lungs: Normal respiratory effort, bilateral clear to auscultation, no crackles or wheezes.  Heart: Regular rate and rhythm, S1 and S2 normal, no murmurs, rubs auscultated Abdomen: BS normoactive,soft,nondistended,non-tender to palpation,no organomegaly Extremities: Right AKA Neuro : Alert and oriented to time, place and person, No focal deficits     Data Reviewed: I have personally reviewed following labs and imaging studies  CBG: Recent Labs  Lab 10/27/17 1857 10/27/17 2256 10/28/17 0749  10/28/17 0920 10/28/17 1142  GLUCAP 88 74 61* 299* 133*    CBC: Recent Labs  Lab 10/24/17 0531 10/25/17 0516 10/26/17 0025 10/27/17 0521 10/28/17 0547  WBC 17.3* 19.9* 19.9* 15.7* 18.5*  HGB 8.0* 8.2* 8.1* 8.7* 9.1*  HCT 27.2* 27.6* 28.0* 30.6* 30.7*  MCV 79.1 79.8 79.3 80.1 79.3  PLT 506* 568* 560* 596* 645*    Basic Metabolic Panel: Recent Labs  Lab 10/22/17 0350 10/23/17 0443 10/24/17 0531 10/25/17 0516 10/26/17 0025 10/27/17 0521 10/28/17 0547  NA 137 139 137 141 144 145  --   K 2.8* 3.5 3.4* 3.3* 3.9 3.8  --   CL 102 105 103 106 109 107  --   CO2 26 24 25 27 28 29   --   GLUCOSE 286* 265* 326* 161* 116* 149*  --   BUN 12 11 11 9 8 8   --   CREATININE 0.73 0.74 0.81 0.80 0.68 0.68 0.68  CALCIUM 7.6* 7.5* 7.3* 7.4* 7.2* 7.3*  --   MG 2.1  --   --  1.6* 1.9  --   --     Recent Results (from the past 240 hour(s))  Culture, blood (Routine x 2)     Status: None   Collection Time: 10/20/17  4:30 PM  Result Value Ref Range Status   Specimen Description   Final    BLOOD LEFT ANTECUBITAL Performed at [facility], [address], [place], [place] [address]    Special Requests   Final    BOTTLES DRAWN AEROBIC AND ANAEROBIC Blood Culture adequate volume Performed at [facility], [address] [place], [place] [address]    Culture   Final    NO GROWTH 5 DAYS Performed at [facility], [address], [place], [place] [address]    Report Status 10/25/2017 FINAL  Final  Urine culture     Status: Abnormal   Collection Time: 10/20/17  4:58 PM  Result Value Ref Range Status   Specimen Description   Final    URINE, RANDOM Performed at [facility], [address], [place], [place] [address]    Special Requests   Final    NONE Performed at [facility], [address] [place], [place] [address]    Culture (A)  Final    >=100,000 COLONIES/mL ACINETOBACTER CALCOACETICUS/BAUMANNII  COMPLEX   Report Status 10/24/2017 FINAL  Final   Organism ID, Bacteria ACINETOBACTER CALCOACETICUS/BAUMANNII COMPLEX (A)  Final      Susceptibility   Acinetobacter calcoaceticus/baumannii complex - MIC*    CEFTAZIDIME >=64 RESISTANT Resistant     CEFTRIAXONE 32 INTERMEDIATE Intermediate     CIPROFLOXACIN >=4 RESISTANT Resistant     GENTAMICIN 8 INTERMEDIATE Intermediate     IMIPENEM >=16 RESISTANT Resistant     PIP/TAZO >=128 RESISTANT Resistant     TRIMETH/SULFA >=320 RESISTANT Resistant     CEFEPIME 32 RESISTANT Resistant     AMPICILLIN/SULBACTAM RESISTANT Resistant     * >=100,000 COLONIES/mL ACINETOBACTER CALCOACETICUS/BAUMANNII COMPLEX  MRSA PCR Screening     Status: Abnormal   Collection Time: 10/21/17  1:12 AM  Result Value Ref Range Status   MRSA by PCR POSITIVE (A) NEGATIVE Final    Comment:        The GeneXpert MRSA Assay (FDA approved for NASAL specimens only), is one component of a comprehensive MRSA colonization surveillance program. It is not intended to diagnose MRSA infection nor to guide or monitor treatment for MRSA infections. RESULT CALLED TO, READ BACK BY AND VERIFIED WITH: K JACKSON,RN @0525  10/21/17 MKELLY Performed at [facility], [address], [place], [place] [address]      Liver Function Tests: Recent Labs  Lab 10/27/17 0521  AST 36  ALT 33  ALKPHOS 99  BILITOT 0.7  PROT 6.5  ALBUMIN 1.9*   No results for input(s): LIPASE, AMYLASE in the last 168 hours. No results for input(s): AMMONIA in the last 168 hours.  Cardiac Enzymes: No results for input(s): CKTOTAL, CKMB, CKMBINDEX, TROPONINI in the last 168 hours. BNP (last 3 results) Recent Labs    01/22/17 0509 03/14/17 0412 09/01/17 0237  BNP 183.2* 414.8* 1,401.6*    ProBNP (last 3 results) No results for input(s): PROBNP in the last 8760 hours.    Studies: No results found.  Scheduled Meds: . aspirin EC  81 mg Oral Daily  . atorvastatin  80 mg Oral  q1800  . calcium-vitamin D  1 tablet Oral Daily  . Chlorhexidine Gluconate Cloth  6 each  Topical Q0600  . gabapentin  600 mg Oral BID  . guaiFENesin  600 mg Oral BID  . HYDROmorphone   Intravenous Q4H  . insulin aspart  0-9 Units Subcutaneous TID WC  . insulin aspart  3 Units Subcutaneous TID WC  . insulin detemir  10 Units Subcutaneous BID  . metoprolol tartrate  12.5 mg Oral BID  . multivitamin with minerals  1 tablet Oral Daily  . mupirocin ointment  1 application Nasal BID  . pantoprazole  40 mg Oral Daily  . rivaroxaban  20 mg Oral Q supper  . sodium chloride flush  3 mL Intravenous Q12H  . sodium hypochlorite   Topical BID      Time spent: 25 min  Meredeth Ide   [name] Pager [phone]. If 7PM-7AM, please contact night-coverage at [email or web address], Office  [phone]  password [email or web address]  10/28/2017, 3:46 PM  LOS: 8 days

## 2017-10-29 LAB — CREATININE, SERUM: CREATININE: 0.68 mg/dL (ref 0.61–1.24)

## 2017-10-29 LAB — GLUCOSE, CAPILLARY
Glucose-Capillary: 167 mg/dL — ABNORMAL HIGH (ref 65–99)
Glucose-Capillary: 161 mg/dL — ABNORMAL HIGH (ref 65–99)
Glucose-Capillary: 120 mg/dL — ABNORMAL HIGH (ref 65–99)
Glucose-Capillary: 125 mg/dL — ABNORMAL HIGH (ref 65–99)

## 2017-10-29 NOTE — Progress Notes (Signed)
[facility]  PROGRESS NOTE  [name] ZOX:[id number] DOB: 09/19/1950 DOA: 10/20/2017 PCP: [name], MD (Inactive)   Brief HPI:   67 y.o.malewith medical history significant foratrial fibrillation on Xarelto, coronary artery disease, insulin-dependent diabetes mellitus, chronic diastolic CHF, chronic low back pain, and microcytic anemia, now presenting to the emergency department from his SNF for evaluation of fevers and decreased responsiveness. Patient was discharged from the hospital 1 week ago after management of septic shock and ventilator dependent respiratory failure, suspected secondary to aspiration pneumonia and complicated by C. difficile infection. He had reportedly been recovering well at his SNF before he was noted to be listless and febrile on 4/11.  In the emergency department, he was found to be in atrial fibrillation with RVR, and concern for severe sepsis with lactic acid 4.1.  He was started on empiric vancomycin, merrem and admitted to stepdown unit. With improvement in HR, he was transitioned off cardizem gtt and transferred to telemetry unit.    Subjective   Patient seen and examined, continues on Dilaudid PCA   Assessment/Plan:     1. 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  No clear abscess was noted per urology.  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  Blood cultures are negative to date.  Started on Solu-Cortef for suspected adrenal insufficiency in setting of hypotension.  Which is being weaned off.  PT consulted for hydrotherapy of decubitus ulcer.  Palliative care consulted for pain management started on PCA Dilaudid pump 10/23/2017.Patient continues on Dilaudid PCA 2. UTI-urine culture grew greater than 100,000 colonies of  Acinetobacter calcoaceticus/baumannii, multidrug resistant.  ID was consulted patient started on IV Unasyn.  Unasyn will be discontinued as it was supposed to given for 3 days only. WBc is elevated to 18000, follow cbc in am. 3. 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  Continue local wound care. Discussed with Dr Luciana Axe, no further antibiotics needed, as the debridement did not find pyocele in the scrotum. 4. Atrial fibrillation with RVR-chads vas score is at least 4.  Xarelto was held on this admission for procedure.  Restarted Xarelto, metoprolol 12.5 mg p.o. twice daily 5. Chronic microcytic anemia-baseline hemoglobin is around 8.  Positive FOBT on admission.  Follow CBC in a.m. 6. Chronic diastolic CHF-euvolemic.  Stable 7. Diabetes mellitus-hemoglobin A1c 6.1, continue Levemir sliding scale insulin NovoLog. 8. CAD-continue Lipitor, aspirin was held, aspirin has been restarted    DVT prophylaxis: SCD  Code Status: DNR  Family Communication: No family at bedside  Disposition Plan: likely skilled facility in next 24 to 48 hours   Consultants:  General surgery   ID  Urology  Palliative care    Procedures: None   Antibiotics:   Anti-infectives (From admission, onward)   Start     Dose/Rate Route Frequency Ordered Stop   10/24/17 1200  Ampicillin-Sulbactam (UNASYN) 3 g in sodium chloride 0.9 % 100 mL IVPB     3 g 200 mL/hr over 30 Minutes Intravenous Every 6 hours 10/24/17 1030 10/27/17 1217   10/22/17 1000  vancomycin (VANCOCIN) 1,250 mg in sodium chloride 0.9 % 250 mL IVPB  Status:  Discontinued     1,250 mg 166.7 mL/hr over 90 Minutes Intravenous Every 12 hours 10/22/17  1610 10/24/17 1030   10/20/17 2200  vancomycin (VANCOCIN) IVPB 1000 mg/200 mL premix  Status:  Discontinued     1,000 mg 200 mL/hr over 60 Minutes Intravenous Every 12 hours 10/20/17 2043 10/22/17 0958    10/20/17 2200  meropenem (MERREM) 1 g in sodium chloride 0.9 % 100 mL IVPB  Status:  Discontinued     1 g 200 mL/hr over 30 Minutes Intravenous Every 8 hours 10/20/17 2043 10/24/17 1030   10/20/17 2000  vancomycin (VANCOCIN) IVPB 1000 mg/200 mL premix  Status:  Discontinued     1,000 mg 200 mL/hr over 60 Minutes Intravenous  Once 10/20/17 1956 10/20/17 2051   10/20/17 1645  piperacillin-tazobactam (ZOSYN) IVPB 3.375 g     3.375 g 100 mL/hr over 30 Minutes Intravenous  Once 10/20/17 1636 10/20/17 1830   10/20/17 1645  vancomycin (VANCOCIN) IVPB 1000 mg/200 mL premix     1,000 mg 200 mL/hr over 60 Minutes Intravenous  Once 10/20/17 1636 10/20/17 1924       Objective   Vitals:   10/29/17 0901 10/29/17 1250 10/29/17 1310 10/29/17 1655  BP:   (!) 107/58   Pulse:   100   Resp: 14 17 16 12   Temp:   98.8 F (37.1 C)   TempSrc:   Oral   SpO2: 100% 99% 99% 99%  Weight:      Height:        Intake/Output Summary (Last 24 hours) at 10/29/2017 1827 Last data filed at 10/29/2017 1310 Gross per 24 hour  Intake 480 ml  Output 2115 ml  Net -1635 ml   Filed Weights   10/27/17 0631 10/28/17 0533 10/29/17 0500  Weight: 122.8 kg (270 lb 11.6 oz) 122.7 kg (270 lb 8.1 oz) 125.8 kg (277 lb 5.4 oz)     Physical Examination:  Physical Exam: Eyes: No icterus, extraocular muscles intact  Mouth: Oral mucosa is moist, no lesions on palate,  Neck: Supple, no deformities, masses, or tenderness Lungs: Normal respiratory effort, bilateral clear to auscultation, no crackles or wheezes.  Heart: Regular rate and rhythm, S1 and S2 normal, no murmurs, rubs auscultated Abdomen: BS normoactive,soft,nondistended,non-tender to palpation,no organomegaly Extremities: s/p Right AKA Neuro : Alert and oriented to time, place and person, No focal deficits     Data Reviewed: I have personally reviewed following labs and imaging studies  CBG: Recent Labs  Lab 10/28/17 1729 10/28/17 2109 10/29/17 0823  10/29/17 1202 10/29/17 1639  GLUCAP 121* 144* 167* 161* 120*    CBC: Recent Labs  Lab 10/24/17 0531 10/25/17 0516 10/26/17 0025 10/27/17 0521 10/28/17 0547  WBC 17.3* 19.9* 19.9* 15.7* 18.5*  HGB 8.0* 8.2* 8.1* 8.7* 9.1*  HCT 27.2* 27.6* 28.0* 30.6* 30.7*  MCV 79.1 79.8 79.3 80.1 79.3  PLT 506* 568* 560* 596* 645*    Basic Metabolic Panel: Recent Labs  Lab 10/23/17 0443 10/24/17 0531 10/25/17 0516 10/26/17 0025 10/27/17 0521 10/28/17 0547 10/29/17 0529  NA 139 137 141 144 145  --   --   K 3.5 3.4* 3.3* 3.9 3.8  --   --   CL 105 103 106 109 107  --   --   CO2 24 25 27 28 29   --   --   GLUCOSE 265* 326* 161* 116* 149*  --   --   BUN 11 11 9 8 8   --   --   CREATININE 0.74 0.81 0.80 0.68 0.68 0.68 0.68  CALCIUM 7.5* 7.3* 7.4* 7.2* 7.3*  --   --  MG  --   --  1.6* 1.9  --   --   --     Recent Results (from the past 240 hour(s))  Culture, blood (Routine x 2)     Status: None   Collection Time: 10/20/17  4:30 PM  Result Value Ref Range Status   Specimen Description   Final    BLOOD LEFT ANTECUBITAL Performed at [facility], [address], [place], [place] [address]    Special Requests   Final    BOTTLES DRAWN AEROBIC AND ANAEROBIC Blood Culture adequate volume Performed at [facility], [address] [place], [place] [address]    Culture   Final    NO GROWTH 5 DAYS Performed at [facility], [address] [place], [place] [address]    Report Status 10/25/2017 FINAL  Final  Urine culture     Status: Abnormal   Collection Time: 10/20/17  4:58 PM  Result Value Ref Range Status   Specimen Description   Final    URINE, RANDOM Performed at [facility], [address], [place], [place] [address]    Special Requests   Final    NONE Performed at [facility], [address] [place], [place] [address]    Culture (A)  Final    >=100,000 COLONIES/mL ACINETOBACTER  CALCOACETICUS/BAUMANNII COMPLEX   Report Status 10/24/2017 FINAL  Final   Organism ID, Bacteria ACINETOBACTER CALCOACETICUS/BAUMANNII COMPLEX (A)  Final      Susceptibility   Acinetobacter calcoaceticus/baumannii complex - MIC*    CEFTAZIDIME >=64 RESISTANT Resistant     CEFTRIAXONE 32 INTERMEDIATE Intermediate     CIPROFLOXACIN >=4 RESISTANT Resistant     GENTAMICIN 8 INTERMEDIATE Intermediate     IMIPENEM >=16 RESISTANT Resistant     PIP/TAZO >=128 RESISTANT Resistant     TRIMETH/SULFA >=320 RESISTANT Resistant     CEFEPIME 32 RESISTANT Resistant     AMPICILLIN/SULBACTAM RESISTANT Resistant     * >=100,000 COLONIES/mL ACINETOBACTER CALCOACETICUS/BAUMANNII COMPLEX  MRSA PCR Screening     Status: Abnormal   Collection Time: 10/21/17  1:12 AM  Result Value Ref Range Status   MRSA by PCR POSITIVE (A) NEGATIVE Final    Comment:        The GeneXpert MRSA Assay (FDA approved for NASAL specimens only), is one component of a comprehensive MRSA colonization surveillance program. It is not intended to diagnose MRSA infection nor to guide or monitor treatment for MRSA infections. RESULT CALLED TO, READ BACK BY AND VERIFIED WITH: K JACKSON,RN @0525  10/21/17 MKELLY Performed at [facility], [address], [place], [place] [address]      Liver Function Tests: Recent Labs  Lab 10/27/17 0521  AST 36  ALT 33  ALKPHOS 99  BILITOT 0.7  PROT 6.5  ALBUMIN 1.9*   No results for input(s): LIPASE, AMYLASE in the last 168 hours. No results for input(s): AMMONIA in the last 168 hours.  Cardiac Enzymes: No results for input(s): CKTOTAL, CKMB, CKMBINDEX, TROPONINI in the last 168 hours. BNP (last 3 results) Recent Labs    01/22/17 0509 03/14/17 0412 09/01/17 0237  BNP 183.2* 414.8* 1,401.6*    ProBNP (last 3 results) No results for input(s): PROBNP in the last 8760 hours.    Studies: No results found.  Scheduled Meds: . aspirin EC  81 mg Oral Daily  .  atorvastatin  80 mg Oral q1800  . calcium-vitamin D  1 tablet  Oral Daily  . gabapentin  600 mg Oral BID  . guaiFENesin  600 mg Oral BID  . HYDROmorphone   Intravenous Q4H  . insulin aspart  0-9 Units Subcutaneous TID WC  . insulin aspart  3 Units Subcutaneous TID WC  . insulin detemir  10 Units Subcutaneous BID  . metoprolol tartrate  12.5 mg Oral BID  . multivitamin with minerals  1 tablet Oral Daily  . pantoprazole  40 mg Oral Daily  . rivaroxaban  20 mg Oral Q supper  . sodium chloride flush  3 mL Intravenous Q12H  . sodium hypochlorite   Topical BID      Time spent: 25 min  Meredeth Ide   [name] Pager [phone]. If 7PM-7AM, please contact night-coverage at [email or web address], Office  [phone]  password [facility]  10/29/2017, 6:27 PM  LOS: 9 days       [facility]  PROGRESS NOTE  [name] BPZ:[id number] DOB: [birth year] DOA: 10/20/2017 PCP: [name], MD (Inactive)   Brief HPI:   67 y.o.malewith medical history significant foratrial fibrillation on Xarelto, coronary artery disease, insulin-dependent diabetes mellitus, chronic diastolic CHF, chronic low back pain, and microcytic anemia, now presenting to the emergency department from his SNF for evaluation of fevers and decreased responsiveness. Patient was discharged from the hospital 1 week ago after management of septic shock and ventilator dependent respiratory failure, suspected secondary to aspiration pneumonia and complicated by C. difficile infection. He had reportedly been recovering well at his SNF before he was noted to be listless and febrile on 4/11.  In the emergency department, he was found to be in atrial fibrillation with RVR, and concern for severe sepsis with lactic acid 4.1.  He was started on empiric vancomycin, merrem and admitted to stepdown unit. With improvement in HR, he was transitioned off cardizem gtt and transferred to telemetry unit.    Subjective   Patient  seen and examined, continues to complain of pain with hydrotherapy, on Dilaudid pca.   Assessment/Plan:     9. 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  No clear abscess was noted per urology.  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  Blood cultures are negative to date.  Started on Solu-Cortef for suspected adrenal insufficiency in setting of hypotension.  Which is being weaned off.  PT consulted for hydrotherapy of decubitus ulcer.  Palliative care consulted for pain management started on PCA Dilaudid pump 10/23/2017. 10. UTI-urine culture grew greater than 100,000 colonies of Acinetobacter calcoaceticus/baumannii, multidrug resistant.  ID was consulted patient started on IV Unasyn.  Unasyn will be discontinued as it was supposed to given for 3 days only. WBc is elevated to 18000, follow cbc in am.  11. 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  Continue local wound care. Discussed with Dr Luciana Axe, no further antibiotics needed, as the debridement did not find pyocele in the scrotum. 12. Atrial fibrillation with RVR-chads vas score is at least 4.  Xarelto was held on this admission for procedure.  Restarted Xarelto, metoprolol 12.5 mg p.o. twice daily 13. Chronic microcytic anemia-baseline hemoglobin is around 8.  Positive FOBT on admission.  Follow CBC in a.m. 14. Chronic diastolic CHF-euvolemic.  Stable 15. Diabetes mellitus-hemoglobin A1c 6.1, continue  Levemir sliding scale insulin NovoLog. 16. CAD-continue Lipitor, aspirin was held, aspirin has been restarted    DVT prophylaxis: SCD  Code Status: DNR  Family Communication: No family at bedside  Disposition Plan: likely skilled  facility in next 24 to 48 hours   Consultants:  General surgery   ID  Urology  Palliative care    Procedures: None   Antibiotics:   Anti-infectives (From admission, onward)   Start     Dose/Rate Route Frequency Ordered Stop   10/24/17 1200  Ampicillin-Sulbactam (UNASYN) 3 g in sodium chloride 0.9 % 100 mL IVPB     3 g 200 mL/hr over 30 Minutes Intravenous Every 6 hours 10/24/17 1030 10/27/17 1217   10/22/17 1000  vancomycin (VANCOCIN) 1,250 mg in sodium chloride 0.9 % 250 mL IVPB  Status:  Discontinued     1,250 mg 166.7 mL/hr over 90 Minutes Intravenous Every 12 hours 10/22/17 0958 10/24/17 1030   10/20/17 2200  vancomycin (VANCOCIN) IVPB 1000 mg/200 mL premix  Status:  Discontinued     1,000 mg 200 mL/hr over 60 Minutes Intravenous Every 12 hours 10/20/17 2043 10/22/17 0958   10/20/17 2200  meropenem (MERREM) 1 g in sodium chloride 0.9 % 100 mL IVPB  Status:  Discontinued     1 g 200 mL/hr over 30 Minutes Intravenous Every 8 hours 10/20/17 2043 10/24/17 1030   10/20/17 2000  vancomycin (VANCOCIN) IVPB 1000 mg/200 mL premix  Status:  Discontinued     1,000 mg 200 mL/hr over 60 Minutes Intravenous  Once 10/20/17 1956 10/20/17 2051   10/20/17 1645  piperacillin-tazobactam (ZOSYN) IVPB 3.375 g     3.375 g 100 mL/hr over 30 Minutes Intravenous  Once 10/20/17 1636 10/20/17 1830   10/20/17 1645  vancomycin (VANCOCIN) IVPB 1000 mg/200 mL premix     1,000 mg 200 mL/hr over 60 Minutes Intravenous  Once 10/20/17 1636 10/20/17 1924       Objective   Vitals:   10/29/17 0901 10/29/17 1250 10/29/17 1310 10/29/17 1655  BP:   (!) 107/58   Pulse:   100   Resp: 14 17 16 12   Temp:   98.8 F (37.1 C)   TempSrc:   Oral   SpO2: 100% 99% 99% 99%  Weight:      Height:        Intake/Output Summary (Last 24 hours) at 10/29/2017 1829 Last data filed at 10/29/2017 1310 Gross per 24 hour  Intake 480 ml  Output 2115 ml  Net -1635 ml   Filed Weights   10/27/17 0631 10/28/17 0533  10/29/17 0500  Weight: 122.8 kg (270 lb 11.6 oz) 122.7 kg (270 lb 8.1 oz) 125.8 kg (277 lb 5.4 oz)     Physical Examination:  Physical Exam: Eyes: No icterus, extraocular muscles intact  Mouth: Oral mucosa is moist, no lesions on palate,  Neck: Supple, no deformities, masses, or tenderness Lungs: Normal respiratory effort, bilateral clear to auscultation, no crackles or wheezes.  Heart: Regular rate and rhythm, S1 and S2 normal, no murmurs, rubs auscultated Abdomen: BS normoactive,soft,nondistended,non-tender to palpation,no organomegaly Extremities: Right AKA Neuro : Alert and oriented to time, place and person, No focal deficits     Data Reviewed: I have personally reviewed following labs and imaging studies  CBG: Recent Labs  Lab 10/28/17 1729 10/28/17 2109 10/29/17 0823 10/29/17 1202 10/29/17 1639  GLUCAP 121* 144* 167* 161* 120*    CBC: Recent Labs  Lab 10/24/17 0531 10/25/17 0516 10/26/17 0025 10/27/17  7092 10/28/17 0547  WBC 17.3* 19.9* 19.9* 15.7* 18.5*  HGB 8.0* 8.2* 8.1* 8.7* 9.1*  HCT 27.2* 27.6* 28.0* 30.6* 30.7*  MCV 79.1 79.8 79.3 80.1 79.3  PLT 506* 568* 560* 596* 645*    Basic Metabolic Panel: Recent Labs  Lab 10/23/17 0443 10/24/17 0531 10/25/17 0516 10/26/17 0025 10/27/17 0521 10/28/17 0547 10/29/17 0529  NA 139 137 141 144 145  --   --   K 3.5 3.4* 3.3* 3.9 3.8  --   --   CL 105 103 106 109 107  --   --   CO2 24 25 27 28 29   --   --   GLUCOSE 265* 326* 161* 116* 149*  --   --   BUN 11 11 9 8 8   --   --   CREATININE 0.74 0.81 0.80 0.68 0.68 0.68 0.68  CALCIUM 7.5* 7.3* 7.4* 7.2* 7.3*  --   --   MG  --   --  1.6* 1.9  --   --   --     Recent Results (from the past 240 hour(s))  Culture, blood (Routine x 2)     Status: None   Collection Time: 10/20/17  4:30 PM  Result Value Ref Range Status   Specimen Description   Final    BLOOD LEFT ANTECUBITAL Performed at [facility] [address], [place],  [place] [address]    Special Requests   Final    BOTTLES DRAWN AEROBIC AND ANAEROBIC Blood Culture adequate volume Performed at [facility], [address] [place], [place] [address]    Culture   Final    NO GROWTH 5 DAYS Performed at [facility], [address], [place], [place] [address]    Report Status 10/25/2017 FINAL  Final  Urine culture     Status: Abnormal   Collection Time: 10/20/17  4:58 PM  Result Value Ref Range Status   Specimen Description   Final    URINE, RANDOM Performed at [facility], [address] [place], [place] [address]    Special Requests   Final    NONE Performed at [facility], [address], [place], [place] [address]    Culture (A)  Final    >=100,000 COLONIES/mL ACINETOBACTER CALCOACETICUS/BAUMANNII COMPLEX   Report Status 10/24/2017 FINAL  Final   Organism ID, Bacteria ACINETOBACTER CALCOACETICUS/BAUMANNII COMPLEX (A)  Final      Susceptibility   Acinetobacter calcoaceticus/baumannii complex - MIC*    CEFTAZIDIME >=64 RESISTANT Resistant     CEFTRIAXONE 32 INTERMEDIATE Intermediate     CIPROFLOXACIN >=4 RESISTANT Resistant     GENTAMICIN 8 INTERMEDIATE Intermediate     IMIPENEM >=16 RESISTANT Resistant     PIP/TAZO >=128 RESISTANT Resistant     TRIMETH/SULFA >=320 RESISTANT Resistant     CEFEPIME 32 RESISTANT Resistant     AMPICILLIN/SULBACTAM RESISTANT Resistant     * >=100,000 COLONIES/mL ACINETOBACTER CALCOACETICUS/BAUMANNII COMPLEX  MRSA PCR Screening     Status: Abnormal   Collection Time: 10/21/17  1:12 AM  Result Value Ref Range Status   MRSA by PCR POSITIVE (A) NEGATIVE Final    Comment:        The GeneXpert MRSA Assay (FDA approved for NASAL specimens only), is one component of a comprehensive MRSA colonization surveillance program. It is not intended to diagnose MRSA infection nor to guide or monitor treatment for MRSA infections. RESULT CALLED TO, READ BACK BY  AND VERIFIED WITH: Ranell Patrick @0525  10/21/17 MKELLY Performed at [facility], [address], [place], [place]      Liver Function Tests: Recent Labs  Lab 10/27/17 0521  AST 36  ALT 33  ALKPHOS 99  BILITOT 0.7  PROT 6.5  ALBUMIN 1.9*   No results for input(s): LIPASE, AMYLASE in the last 168 hours. No results for input(s): AMMONIA in the last 168 hours.  Cardiac Enzymes: No results for input(s): CKTOTAL, CKMB, CKMBINDEX, TROPONINI in the last 168 hours. BNP (last 3 results) Recent Labs    01/22/17 0509 03/14/17 0412 09/01/17 0237  BNP 183.2* 414.8* 1,401.6*    ProBNP (last 3 results) No results for input(s): PROBNP in the last 8760 hours.    Studies: No results found.  Scheduled Meds: . aspirin EC  81 mg Oral Daily  . atorvastatin  80 mg Oral q1800  . calcium-vitamin D  1 tablet Oral Daily  . gabapentin  600 mg Oral BID  . guaiFENesin  600 mg Oral BID  . HYDROmorphone   Intravenous Q4H  . insulin aspart  0-9 Units Subcutaneous TID WC  . insulin aspart  3 Units Subcutaneous TID WC  . insulin detemir  10 Units Subcutaneous BID  . metoprolol tartrate  12.5 mg Oral BID  . multivitamin with minerals  1 tablet Oral Daily  . pantoprazole  40 mg Oral Daily  . rivaroxaban  20 mg Oral Q supper  . sodium chloride flush  3 mL Intravenous Q12H  . sodium hypochlorite   Topical BID      Time spent: 25 min  09/03/17   [name] Pager [phone]. If 7PM-7AM, please contact night-coverage at [email or web address], Office  [phone]  password [email or web address]  10/29/2017, 6:29 PM  LOS: 9 days

## 2017-10-29 NOTE — Progress Notes (Signed)
Pt has refused dressing changes, being rotated to relieve sacral pressure, and a rolled towel under edematous scrotum (per urology recommendation).

## 2017-10-29 NOTE — Progress Notes (Signed)
CCM called that pt had 10 beat run of V-tach. Night MD coverage made aware. No new orders placed.

## 2017-10-29 NOTE — Progress Notes (Signed)
                         PT---HYDROTHERAPY TREATMENT NOTE   10/29/17 1600  Subjective Assessment  Subjective "I guess", pt reluctantly agrees to PT/hydrotherapy  Patient and Family Stated Goals no pain  Prior Treatments dressing change  Evaluation and Treatment  Evaluation and Treatment Procedures Explained to Patient/Family Yes  Evaluation and Treatment Procedures agreed to  Wound / Incision (Open or Dehisced) 10/22/17 Other (Comment) Sacrum Medial large wound contaminated  with BM  Date First Assessed/Time First Assessed: 10/22/17 1444   Wound Type: Other (Comment)  Location: Sacrum  Location Orientation: Medial  Wound Description (Comments): large wound contaminated  with BM  Present on Admission: Yes  Dressing Type Moist to dry;Gauze (Comment);ABD;Tape dressing  Dressing Change Frequency Twice a day  % Wound base Red or Granulating 45%  % Wound base Yellow/Fibrinous Exudate 55%  Wound Length (cm)  (see eval)  Margins Unattached edges (unapproximated)  Drainage Amount Moderate  Drainage Description Purulent;Green;Odor  Non-staged Wound Description Full thickness  Treatment Cleansed;Hydrotherapy (Pulse lavage);Other (Comment) (Dakin's)  Hydrotherapy  Pulsed lavage therapy - wound location sacrum  Pulsed Lavage with Suction (psi) 8 psi (4-8)  Pulsed Lavage with Suction - Normal Saline Used 1000 mL  Pulsed Lavage Tip Tip with splash shield  Wound Therapy - Assess/Plan/Recommendations  Wound Therapy - Clinical Statement Pt is a 67 year old male with large, painful, unstageable, sacral pressure injury.  The patient requires extensive assistance for positioning d/t limited UE movement and  R AKA. The patient has resided in SNF for several months and previously received Hydrotherapy for a sacral wound. The patient is on an air mattress; The  patient will benefit from PLS.  Wound continues to have  green and malodorous moderate to copious exudate present on old dressing and bed pad. Dakin's  started again and used today. Gentle hydro (low psi d/t pain) and  light packing of the scrotal site as well;   Wound Therapy - Functional Problem List total assistance for mobility.  Factors Delaying/Impairing Wound Healing Incontinence;Multiple medical problems;Immobility  Hydrotherapy Plan Debridement;Dressing change;Patient/family education;Pulsatile lavage with suction  Wound Therapy - Frequency 6X / week  Wound Therapy - Follow Up Recommendations Skilled nursing facility;Other (comment) (?LTACH)  Wound Plan PLS and dressing changes  Wound Therapy Goals - Improve the function of patient's integumentary system by progressing the wound(s) through the phases of wound healing by:  Decrease Necrotic Tissue to 20  Decrease Necrotic Tissue - Progress Progressing toward goal  Increase Granulation Tissue to 80  Increase Granulation Tissue - Progress Progressing toward goal  Goals/treatment plan/discharge plan were made with and agreed upon by patient/family No, Patient unable to participate in goals/treatment/discharge plan and family unavailable  Time For Goal Achievement 2 weeks  Wound Therapy - Potential for Goals Poor  Drucilla Chalet, PT Pager: [phone] 10/29/2017

## 2017-10-29 NOTE — Progress Notes (Signed)
Daily Progress Note   Patient Name: [name]       Date: 10/29/2017 DOB: 1951/02/11  Age: 67 y.o. MRN#: [id number] Attending Physician: Meredeth Ide, MD Primary Care Physician: [name], MD (Inactive) Admit Date: 10/20/2017  Reason for Consultation/Follow-up: Establishing goals of care and Pain control  Subjective: Mr Gabrielle is awake alert Sitting up in bed He is eating lunch  He states that overall his pain is some what reasonably controlled, how ever, he does have incident pain when he is moved/turned or when he has hydro therapy LTACH consult pending   Mr guardado continues to endorse that he doesn't want to de escalate PCA, he states that his pain at times, is still severely uncontrolled.     PPS 30% See below.  Length of Stay: 9  Current Medications: Scheduled Meds:  . aspirin EC  81 mg Oral Daily  . atorvastatin  80 mg Oral q1800  . calcium-vitamin D  1 tablet Oral Daily  . gabapentin  600 mg Oral BID  . guaiFENesin  600 mg Oral BID  . HYDROmorphone   Intravenous Q4H  . insulin aspart  0-9 Units Subcutaneous TID WC  . insulin aspart  3 Units Subcutaneous TID WC  . insulin detemir  10 Units Subcutaneous BID  . metoprolol tartrate  12.5 mg Oral BID  . multivitamin with minerals  1 tablet Oral Daily  . pantoprazole  40 mg Oral Daily  . rivaroxaban  20 mg Oral Q supper  . sodium chloride flush  3 mL Intravenous Q12H  . sodium hypochlorite   Topical BID    Continuous Infusions:   PRN Meds: acetaminophen **OR** acetaminophen, albuterol, diphenhydrAMINE **OR** diphenhydrAMINE, HYDROmorphone (DILAUDID) injection, naloxone **AND** sodium chloride flush, ondansetron **OR** ondansetron (ZOFRAN) IV, polyvinyl alcohol, white petrolatum  Physical Exam        .    Obese, pale in appearance  Cardiovascular: Normal rate. An irregularly irregular rhythm present.  Pulmonary/Chest: No accessory muscle usage. No tachypnea. No respiratory distress. He has decreased breath sounds.  regular breaths  Abdominal: Soft. Normal appearance.   Vital Signs: BP (!) 107/58   Pulse 100   Temp 98.8 F (37.1 C) (Oral)   Resp 16   Ht 6' (1.829 m)   Wt 125.8 kg (277 lb 5.4 oz)  SpO2 99%   BMI 37.61 kg/m  SpO2: SpO2: 99 % O2 Device: O2 Device: Nasal Cannula O2 Flow Rate: O2 Flow Rate (L/min): 3 L/min  Intake/output summary:   Intake/Output Summary (Last 24 hours) at 10/29/2017 1417 Last data filed at 10/29/2017 1310 Gross per 24 hour  Intake 560 ml  Output 3315 ml  Net -2755 ml   LBM: Last BM Date: 10/27/17 Baseline Weight: Weight: 121.6 kg (268 lb) Most recent weight: Weight: 125.8 kg (277 lb 5.4 oz)       Palliative Assessment/Data:    Flowsheet Rows     Most Recent Value  Intake Tab  Referral Department  Hospitalist  Unit at Time of Referral  Med/Surg Unit  Palliative Care Primary Diagnosis  Sepsis/Infectious Disease  Date Notified  10/21/17  Palliative Care Type  Return patient Palliative Care  Reason for referral  Clarify Goals of Care, Pain  Date of Admission  10/20/17  Date first seen by Palliative Care  10/22/17  # of days Palliative referral response time  1 Day(s)  # of days IP prior to Palliative referral  1  Clinical Assessment  Palliative Performance Scale Score  50%  Pain Max last 24 hours  9  Pain Min Last 24 hours  3  Psychosocial & Spiritual Assessment  Palliative Care Outcomes      Patient Active Problem List   Diagnosis Date Noted  . Protein-calorie malnutrition, severe (HCC) 10/20/2017  . Sepsis (HCC) 10/20/2017  . Occult blood in stools 10/20/2017  . C. difficile diarrhea 10/12/2017  . Hypomagnesemia 10/12/2017  . Septic shock (HCC) 09/29/2017  . Acute encephalopathy   . Palliative care encounter   . UTI  (urinary tract infection) 09/01/2017  . Acute metabolic encephalopathy 09/01/2017  . S/P AKA (above knee amputation) unilateral, right (HCC) 09/01/2017  . Atrial fibrillation with rapid ventricular response (HCC)   . Hyponatremia 04/11/2017  . ARF (acute renal failure) (HCC) 04/11/2017  . Pressure injury of skin 03/24/2017  . Below knee amputation status, right (HCC) 03/23/2017  . DKA (diabetic ketoacidoses) (HCC) 03/14/2017  . Elevated troponin 03/14/2017  . Diabetic polyneuropathy associated with type 2 diabetes mellitus (HCC)   . Osteoarthritis of both knees   . Diabetic nephropathy associated with type 2 diabetes mellitus (HCC)   . Bilateral lower leg cellulitis 01/22/2017  . Venous stasis 08/06/2016  . At high risk for falls 07/21/2016  . CHF exacerbation (HCC) 06/10/2016  . Lactic acidosis 06/09/2016  . Syncope and collapse 03/05/2016  . Acute on chronic renal insufficiency 03/05/2016  . Obesity 12/30/2015  . Scrotal swelling   . Foot fracture   . 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 Lower GI bleed   . Pressure ulcer 12/24/2015  . Acute kidney injury (HCC) 12/23/2015  . Back pain 12/23/2015  . Fall   . Metatarsal fracture   . Traumatic rhabdomyolysis (HCC)   . Hyperkalemia   . Lisfranc dislocation   . Hyperglycemia   . Sepsis due to skin infection (HCC) 09/08/2015  . Scrotal wall abscess 09/08/2015  . AKI (acute kidney injury) (HCC) 09/08/2015  . Chest wall pain   . On amiodarone therapy 12/08/2014  . Respiratory failure with hypoxia (HCC) 12/04/2014  . Hypoxia 12/04/2014  . Acute on chronic respiratory failure with hypoxia (HCC)   . Atrial fibrillation with RVR (HCC) 09/20/2014  . Chronic diastolic CHF (congestive heart failure) (HCC) 09/19/2014  . Acute respiratory failure (HCC) 09/19/2014  . Abnormal  nuclear cardiac imaging test 02/12/2014  . Dyspnea 02/12/2014  . Chronic anticoagulation- Coumadin 02/12/2014  . CKD (chronic kidney disease)  stage 3, GFR 30-59 ml/min (HCC) 02/12/2014  . Chronic diastolic congestive heart failure (HCC) 02/11/2014  . Encounter for therapeutic drug monitoring 08/03/2013  . Pilonidal cyst 01/25/2013  . Perirectal cellulitis 09/06/2012  . Long term (current) use of anticoagulants 11/18/2011  . Obstructive sleep apnea- unable to tolerate c-pap 08/04/2011  . CAD S/P percutaneous coronary angioplasty 08/04/2011  . ANEMIA-IRON DEFICIENCY 01/06/2009  . OTHER AND UNSPECIFIED COAGULATION DEFECTS 01/06/2009  . Anemia 01/02/2009  . CARDIOVASCULAR FUNCTION STUDY, ABNORMAL 01/02/2009  . Essential hypertension, benign 10/22/2008  . Atrial fibrillation 10/22/2008  . Hyperlipidemia 09/23/2008  . Hypokalemia 09/23/2008  . Obesity, Class III, BMI 40-49.9 (morbid obesity) (HCC) 09/23/2008  . CELLULITIS AND ABSCESS OF LEG EXCEPT FOOT 09/23/2008    Palliative Care Assessment & Plan   Patient Profile: 67 y.o. male  with past medical history of hypertension, hyperlipidemia, diabetes mellitus, atrial fibrillation onXarelto, OSA not on CPAP, GI bleeding,dCHF,COPD, CKD3,s/p of right AKA admitted on 10/20/2017 with sepsis, a fib, and concern for GI bleeding.  This is his 3rd admission in 6 months and palliative was involved in care during recent admission with discharge on 4/5.  Palliative consulted for goals of care and assistance with pain management.  Recommendations/Plan: -Pain: remains on PCA as per patient request.    Call placed, unable to reach sister Gregorey Nabor on 10-27-17. No family at bedside this am either.   Discussed with case management as well as [facility] MD: patient's goals are not comfort-only/hospice focused, at this time. He wishes to continue with IV Antibiotics, wound care and hydrotherapy. He is asking about [facility]. Case manager to request consult from LTACH/ [facility]. Await further information.   Continue current mode of care for now. Overall goals of care and disposition remains a complex and  challenging issue. PMT to continue to follow. LTACH consult pending.     Code Status:    Code Status Orders  (From admission, onward)        Start     Ordered   10/20/17 1953  Do not attempt resuscitation (DNR)  Continuous    Question Answer Comment  In the event of cardiac or respiratory ARREST Do not call a "code blue"   In the event of cardiac or respiratory ARREST Do not perform Intubation, CPR, defibrillation or ACLS   In the event of cardiac or respiratory ARREST Use medication by any route, position, wound care, and other measures to relive pain and suffering. May use oxygen, suction and manual treatment of airway obstruction as needed for comfort.      10/20/17 1956    Code Status History    Date Active Date Inactive Code Status Order ID Comments User Context   09/29/2017 0457 10/15/2017 0232 Full Code [id number]  Erin Fulling, MD ED   09/08/2017 1026 09/13/2017 2008 DNR [id number]  Alwyn Ren, MD Inpatient   09/01/2017 0535 09/08/2017 1026 Full Code [id number]  Lorretta Harp, MD ED   04/11/2017 0425 04/22/2017 0201 DNR [id number]  Pearson Grippe, MD ED   03/23/2017 1511 03/28/2017 2216 DNR [id number]  Nadara Mustard, MD Inpatient   03/14/2017 0831 03/18/2017 2212 Full Code [id number]  Ozella Rocks, MD ED   03/14/2017 0831 03/14/2017 0831 Full Code [id number]  Ozella Rocks, MD ED   01/22/2017 0857 01/25/2017 2139 Full Code [id number]  Jordan Hawks, DO Inpatient  06/10/2016 0946 06/18/2016 0030 Full Code [id number]  Therisa Doyne, MD Inpatient   12/23/2015 2049 12/30/2015 1821 DNR [id number]  Casey Burkitt, MD Inpatient   09/08/2015 2156 09/17/2015 2240 Full Code [id number]  Ron Parker, MD Inpatient   07/08/2015 2124 07/10/2015 1811 Full Code [id number]  Lorretta Harp, MD ED   12/04/2014 2303 12/11/2014 2124 Full Code [id number]  Pearson Grippe, MD Inpatient   09/19/2014 1806 09/24/2014 1845 Full Code [id number]  Corrie Dandy Inpatient   02/11/2014 1557 02/14/2014 1629  Full Code [id number]  Rosalio Macadamia, NP Inpatient   09/07/2012 0131 09/10/2012 1731 Full Code [id number]  Eduard Clos, MD Inpatient       Prognosis:   Unable to determine  Discharge Planning:  To Be Determined  Care plan was discussed with patient,     Thank you for allowing the Palliative Medicine Team to assist in the care of this patient.   Total Time 25 Prolonged Time Billed No      Greater than 50%  of this time was spent counseling and coordinating care related to the above assessment and plan.  Rosalin Hawking, MD [phone] Please contact Palliative Medicine Team phone at [phone] for questions and concerns.

## 2017-10-29 NOTE — Progress Notes (Signed)
4 Days Post-Op Subjective: No acute events overnight.  He refused a dressing change this AM 2/2 pain.   Objective: Vital signs in last 24 hours: Temp:  [98.6 F (37 C)-99.2 F (37.3 C)] 99 F (37.2 C) (04/20 0550) Pulse Rate:  [96-114] 114 (04/20 0550) Resp:  [10-18] 14 (04/20 0901) BP: (107-127)/(53-84) 115/84 (04/20 0550) SpO2:  [96 %-100 %] 100 % (04/20 0901) Weight:  [125.8 kg (277 lb 5.4 oz)] 125.8 kg (277 lb 5.4 oz) (04/20 0500)  Intake/Output from previous day: 04/19 0701 - 04/20 0700 In: 580 [P.O.:480] Out: 2675 [Urine:2675]  Intake/Output this shift: No intake/output data recorded.  Physical Exam:  General: Alert and oriented CV: palpable distal pulses Lungs: equal chest rise Abdomen: Soft, NTND, no rebound or guarding Gu: Scrotal edema with no ulcers or lesion.  The perineum could no be examined 2/2 patient discomfort. Foley in place and draining clear-yellow urine Ext: NT, No erythema  Lab Results: Recent Labs    10/27/17 0521 10/28/17 0547  HGB 8.7* 9.1*  HCT 30.6* 30.7*   BMET Recent Labs    10/27/17 0521 10/28/17 0547 10/29/17 0529  NA 145  --   --   K 3.8  --   --   CL 107  --   --   CO2 29  --   --   GLUCOSE 149*  --   --   BUN 8  --   --   CREATININE 0.68 0.68 0.68  CALCIUM 7.3*  --   --      Studies/Results: No results found.  Assessment/Plan: 67yo with perineal wound and scrotal edema  -Continue local wound care and dressing changes as tolerated.  Placement to LTAC pending.     LOS: 9 days   Rhoderick Moody, MD [facility] Pager: [phone]  10/29/2017, 10:19 AM

## 2017-10-30 DIAGNOSIS — G9340 Encephalopathy, unspecified: Secondary | ICD-10-CM

## 2017-10-30 LAB — BASIC METABOLIC PANEL
Anion gap: 8 (ref 5–15)
BUN: 11 mg/dL (ref 6–20)
CALCIUM: 6.4 mg/dL — AB (ref 8.9–10.3)
CHLORIDE: 103 mmol/L (ref 101–111)
CO2: 30 mmol/L (ref 22–32)
CREATININE: 0.77 mg/dL (ref 0.61–1.24)
GFR calc non Af Amer: >60 mL/min (ref >60)
Glucose, Bld: 81 mg/dL (ref 65–99)
Potassium: 3.5 mmol/L (ref 3.5–5.1)
Sodium: 141 mmol/L (ref 135–145)

## 2017-10-30 LAB — CBC
HCT: 22.3 % — ABNORMAL LOW (ref 39.0–52.0)
Hemoglobin: 6.8 g/dL — CL (ref 13.0–17.0)
MCH: 23.7 pg — AB (ref 26.0–34.0)
MCHC: 30.5 g/dL (ref 30.0–36.0)
MCV: 77.7 fL — ABNORMAL LOW (ref 78.0–100.0)
Platelets: 527 10*3/uL — ABNORMAL HIGH (ref 150–400)
RBC: 2.87 MIL/uL — ABNORMAL LOW (ref 4.22–5.81)
RDW: 22.2 % — AB (ref 11.5–15.5)
WBC: 16.6 10*3/uL — ABNORMAL HIGH (ref 4.0–10.5)

## 2017-10-30 LAB — GLUCOSE, CAPILLARY
GLUCOSE-CAPILLARY: 169 mg/dL — AB (ref 65–99)
GLUCOSE-CAPILLARY: 142 mg/dL — AB (ref 65–99)
Glucose-Capillary: 95 mg/dL (ref 65–99)
Glucose-Capillary: 169 mg/dL — ABNORMAL HIGH (ref 65–99)

## 2017-10-30 LAB — PREPARE RBC (CROSSMATCH)

## 2017-10-30 MED ORDER — FUROSEMIDE 10 MG/ML IJ SOLN
20.0000 mg | Freq: Two times a day (BID) | INTRAMUSCULAR | Status: DC
  Administered 2017-10-30 – 2017-11-01 (×5): 20 mg via INTRAVENOUS
  Filled 2017-10-30 (×6): qty 2

## 2017-10-30 MED ORDER — SODIUM CHLORIDE 0.9 % IV SOLN
Freq: Once | INTRAVENOUS | Status: AC
  Administered 2017-11-01: 08:00:00 via INTRAVENOUS

## 2017-10-30 NOTE — Progress Notes (Signed)
CRITICAL VALUE ALERT  Critical Value:  Calcium 6.4  Date & Time Notied:  10/30/17 @ 0629  Provider Notified: Dr. Rana Snare  Orders Received/Actions taken: No new orders received

## 2017-10-30 NOTE — Progress Notes (Signed)
CRITICAL VALUE ALERT  Critical Value:  Hgb 6.8  Date & Time Notied:  10/30/17 @ 0620  Provider Notified: Dr. Rana Snare   Orders Received/Actions taken: Orders to prepare and transfuse PRBC

## 2017-10-30 NOTE — Progress Notes (Signed)
Daily Progress Note   Patient Name: [name]       Date: 10/30/2017 DOB: 1951/05/21  Age: 67 y.o. MRN#: [id number] Attending Physician: Meredeth Ide, MD Primary Care Physician: [name], MD (Inactive) Admit Date: 10/20/2017  Reason for Consultation/Follow-up: Establishing goals of care and Pain control  Subjective: [name] is awake alert Sitting up in bed  in good spirits today  He states that overall his pain is some what reasonably controlled, how ever, he does have incident pain when he is moved/turned or when he has hydro therapy LTACH consult pending   [name] continues to endorse that he doesn't want to de escalate PCA, he states that his pain at times, is still severely uncontrolled.      See below.  Length of Stay: 10  Current Medications: Scheduled Meds:  . aspirin EC  81 mg Oral Daily  . atorvastatin  80 mg Oral q1800  . calcium-vitamin D  1 tablet Oral Daily  . furosemide  20 mg Intravenous Q12H  . gabapentin  600 mg Oral BID  . guaiFENesin  600 mg Oral BID  . HYDROmorphone   Intravenous Q4H  . insulin aspart  0-9 Units Subcutaneous TID WC  . insulin aspart  3 Units Subcutaneous TID WC  . insulin detemir  10 Units Subcutaneous BID  . metoprolol tartrate  12.5 mg Oral BID  . multivitamin with minerals  1 tablet Oral Daily  . pantoprazole  40 mg Oral Daily  . rivaroxaban  20 mg Oral Q supper  . sodium chloride flush  3 mL Intravenous Q12H  . sodium hypochlorite   Topical BID    Continuous Infusions: . sodium chloride      PRN Meds: acetaminophen **OR** acetaminophen, albuterol, diphenhydrAMINE **OR** diphenhydrAMINE, HYDROmorphone (DILAUDID) injection, naloxone **AND** sodium chloride flush, ondansetron **OR** ondansetron (ZOFRAN) IV,  polyvinyl alcohol, white petrolatum  Physical Exam        .  Obese, pale in appearance  Cardiovascular: Normal rate. An irregularly irregular rhythm present.  Pulmonary/Chest: No accessory muscle usage. No tachypnea. No respiratory distress. regular breaths  Abdominal: Soft. Normal appearance.   Vital Signs: BP 110/60   Pulse 85   Temp 98.9 F (37.2 C) (Oral)   Resp 14   Ht 6' (1.829 m)   [employer]  125.8 kg (277 lb 5.4 oz)   SpO2 100%   BMI 37.61 kg/m  SpO2: SpO2: 100 % O2 Device: O2 Device: Nasal Cannula O2 Flow Rate: O2 Flow Rate (L/min): 3 L/min  Intake/output summary:   Intake/Output Summary (Last 24 hours) at 10/30/2017 1242 Last data filed at 10/30/2017 1158 Gross per 24 hour  Intake 1479 ml  Output 3190 ml  Net -1711 ml   LBM: Last BM Date: 10/27/17 Baseline Weight: Weight: 121.6 kg (268 lb) Most recent weight: Weight: 125.8 kg (277 lb 5.4 oz)      PPS 40% Palliative Assessment/Data:    Flowsheet Rows     Most Recent Value  Intake Tab  Referral Department  Hospitalist  Unit at Time of Referral  Med/Surg Unit  Palliative Care Primary Diagnosis  Sepsis/Infectious Disease  Date Notified  10/21/17  Palliative Care Type  Return patient Palliative Care  Reason for referral  Clarify Goals of Care, Pain  Date of Admission  10/20/17  Date first seen by Palliative Care  10/22/17  # of days Palliative referral response time  1 Day(s)  # of days IP prior to Palliative referral  1  Clinical Assessment  Palliative Performance Scale Score  50%  Pain Max last 24 hours  9  Pain Min Last 24 hours  3  Psychosocial & Spiritual Assessment  Palliative Care Outcomes      Patient Active Problem List   Diagnosis Date Noted  . Protein-calorie malnutrition, severe (HCC) 10/20/2017  . Sepsis (HCC) 10/20/2017  . Occult blood in stools 10/20/2017  . C. difficile diarrhea 10/12/2017  . Hypomagnesemia 10/12/2017  . Septic shock (HCC) 09/29/2017  . Acute encephalopathy   .  Palliative care encounter   . UTI (urinary tract infection) 09/01/2017  . Acute metabolic encephalopathy 09/01/2017  . S/P AKA (above knee amputation) unilateral, right (HCC) 09/01/2017  . Atrial fibrillation with rapid ventricular response (HCC)   . Hyponatremia 04/11/2017  . ARF (acute renal failure) (HCC) 04/11/2017  . Pressure injury of skin 03/24/2017  . Below knee amputation status, right (HCC) 03/23/2017  . DKA (diabetic ketoacidoses) (HCC) 03/14/2017  . Elevated troponin 03/14/2017  . Diabetic polyneuropathy associated with type 2 diabetes mellitus (HCC)   . Osteoarthritis of both knees   . Diabetic nephropathy associated with type 2 diabetes mellitus (HCC)   . Bilateral lower leg cellulitis 01/22/2017  . Venous stasis 08/06/2016  . At high risk for falls 07/21/2016  . CHF exacerbation (HCC) 06/10/2016  . Lactic acidosis 06/09/2016  . Syncope and collapse 03/05/2016  . Acute on chronic renal insufficiency 03/05/2016  . Obesity 12/30/2015  . Scrotal swelling   . Foot fracture   . 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 Lower GI bleed   . Pressure ulcer 12/24/2015  . Acute kidney injury (HCC) 12/23/2015  . Back pain 12/23/2015  . Fall   . Metatarsal fracture   . Traumatic rhabdomyolysis (HCC)   . Hyperkalemia   . Lisfranc dislocation   . Hyperglycemia   . Sepsis due to skin infection (HCC) 09/08/2015  . Scrotal wall abscess 09/08/2015  . AKI (acute kidney injury) (HCC) 09/08/2015  . Chest wall pain   . On amiodarone therapy 12/08/2014  . Respiratory failure with hypoxia (HCC) 12/04/2014  . Hypoxia 12/04/2014  . Acute on chronic respiratory failure with hypoxia (HCC)   . Atrial fibrillation with RVR (HCC) 09/20/2014  . Chronic diastolic CHF (congestive heart failure) (HCC) 09/19/2014  .  Acute respiratory failure (HCC) 09/19/2014  . Abnormal nuclear cardiac imaging test 02/12/2014  . Dyspnea 02/12/2014  . Chronic anticoagulation- Coumadin  02/12/2014  . CKD (chronic kidney disease) stage 3, GFR 30-59 ml/min (HCC) 02/12/2014  . Chronic diastolic congestive heart failure (HCC) 02/11/2014  . Encounter for therapeutic drug monitoring 08/03/2013  . Pilonidal cyst 01/25/2013  . Perirectal cellulitis 09/06/2012  . Long term (current) use of anticoagulants 11/18/2011  . Obstructive sleep apnea- unable to tolerate c-pap 08/04/2011  . CAD S/P percutaneous coronary angioplasty 08/04/2011  . ANEMIA-IRON DEFICIENCY 01/06/2009  . OTHER AND UNSPECIFIED COAGULATION DEFECTS 01/06/2009  . Anemia 01/02/2009  . CARDIOVASCULAR FUNCTION STUDY, ABNORMAL 01/02/2009  . Essential hypertension, benign 10/22/2008  . Atrial fibrillation 10/22/2008  . Hyperlipidemia 09/23/2008  . Hypokalemia 09/23/2008  . Obesity, Class III, BMI 40-49.9 (morbid obesity) (HCC) 09/23/2008  . CELLULITIS AND ABSCESS OF LEG EXCEPT FOOT 09/23/2008    Palliative Care Assessment & Plan   Patient Profile: 67 y.o. male  with past medical history of hypertension, hyperlipidemia, diabetes mellitus, atrial fibrillation onXarelto, OSA not on CPAP, GI bleeding,dCHF,COPD, CKD3,s/p of right AKA admitted on 10/20/2017 with sepsis, a fib, and concern for GI bleeding.  This is his 3rd admission in 6 months and palliative was involved in care during recent admission with discharge on 4/5.  Palliative consulted for goals of care and assistance with pain management.  Recommendations/Plan: -Pain: remains on PCA as per patient request.    Call placed, unable to reach sister Boyde Grieco on 10-27-17. No family at bedside this am either.   Discussed with patient and TR[facility] MD: no hematemesis, no blood loss in stools, denies shortness of breath. Agree with trial of Lasix, patient getting PRBC and additional work up.   patient's goals are not comfort-only/hospice focused, at this time. He wishes to continue with IV Antibiotics, wound care and hydrotherapy. He is asking about [facility]. Case  manager to request consult from LTACH/ [facility]. Await further information.   Continue current mode of care for now. Overall goals of care and disposition remains a complex and challenging issue. PMT to continue to follow. LTACH consult pending.     Code Status:    Code Status Orders  (From admission, onward)        Start     Ordered   10/20/17 1953  Do not attempt resuscitation (DNR)  Continuous    Question Answer Comment  In the event of cardiac or respiratory ARREST Do not call a "code blue"   In the event of cardiac or respiratory ARREST Do not perform Intubation, CPR, defibrillation or ACLS   In the event of cardiac or respiratory ARREST Use medication by any route, position, wound care, and other measures to relive pain and suffering. May use oxygen, suction and manual treatment of airway obstruction as needed for comfort.      10/20/17 1956    Code Status History    Date Active Date Inactive Code Status Order ID Comments User Context   09/29/2017 0457 10/15/2017 0232 Full Code [id number]  Erin Fulling, MD ED   09/08/2017 1026 09/13/2017 2008 DNR [id number]  Alwyn Ren, MD Inpatient   09/01/2017 0535 09/08/2017 1026 Full Code [id number]  Lorretta Harp, MD ED   04/11/2017 0425 04/22/2017 0201 DNR [id number]  Pearson Grippe, MD ED   03/23/2017 1511 03/28/2017 2216 DNR [id number]  Nadara Mustard, MD Inpatient   03/14/2017 0831 03/18/2017 2212 Full Code [id number]  Ozella Rocks, MD ED  03/14/2017 0831 03/14/2017 0831 Full Code [id number]  Ozella Rocks, MD ED   01/22/2017 0857 01/25/2017 2139 Full Code [id number]  Jordan Hawks, DO Inpatient   06/10/2016 0946 06/18/2016 0030 Full Code [id number]  Therisa Doyne, MD Inpatient   12/23/2015 2049 12/30/2015 1821 DNR [id number]  Casey Burkitt, MD Inpatient   09/08/2015 2156 09/17/2015 2240 Full Code [id number]  Ron Parker, MD Inpatient   07/08/2015 2124 07/10/2015 1811 Full Code [id number]  Lorretta Harp, MD ED   12/04/2014 2303  12/11/2014 2124 Full Code [id number]  Pearson Grippe, MD Inpatient   09/19/2014 1806 09/24/2014 1845 Full Code [id number]  Corrie Dandy Inpatient   02/11/2014 1557 02/14/2014 1629 Full Code [id number]  Rosalio Macadamia, NP Inpatient   09/07/2012 0131 09/10/2012 1731 Full Code [id number]  Eduard Clos, MD Inpatient       Prognosis:   Unable to determine  Discharge Planning:  LTACH/SNF  Care plan was discussed with patient,  [name]    Thank you for allowing the Palliative Medicine Team to assist in the care of this patient.   Total Time 25 Prolonged Time Billed No      Greater than 50%  of this time was spent counseling and coordinating care related to the above assessment and plan.  Rosalin Hawking, MD [phone] Please contact Palliative Medicine Team phone at [phone] for questions and concerns.

## 2017-10-30 NOTE — Progress Notes (Signed)
Patient refused dressing change today. He refused to roll to his side. He became loud and stated, "Not now!" Attempted again after giving patient time to relax. Purpose and benefit of regular wound care explained to patient as well as offering bolus dose of dilaudid for pain control during dressing change. Again he refused shouting, "No, I don't want it!" "Not now!" Second nurse Abby Pinnix RN attempted to reason with patient but he continues to refuse to be turned for dressing change.

## 2017-10-30 NOTE — Progress Notes (Signed)
[facility]  PROGRESS NOTE  [name] JFH:[id number] DOB: May 11, 1951 DOA: 10/20/2017 PCP: [name], MD (Inactive)   Brief HPI:   67 y.o.malewith medical history significant foratrial fibrillation on Xarelto, coronary artery disease, insulin-dependent diabetes mellitus, chronic diastolic CHF, chronic low back pain, and microcytic anemia, now presenting to the emergency department from his SNF for evaluation of fevers and decreased responsiveness. Patient was discharged from the hospital 1 week ago after management of septic shock and ventilator dependent respiratory failure, suspected secondary to aspiration pneumonia and complicated by C. difficile infection. He had reportedly been recovering well at his SNF before he was noted to be listless and febrile on 4/11.  In the emergency department, he was found to be in atrial fibrillation with RVR, and concern for severe sepsis with lactic acid 4.1.  He was started on empiric vancomycin, merrem and admitted to stepdown unit. With improvement in HR, he was transitioned off cardizem gtt and transferred to telemetry unit.    Subjective   Patient seen and examined, still complains of pain.  Currently on Dilaudid PCA pump.   Assessment/Plan:     1. 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  No clear abscess was noted per urology.  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  Blood cultures are negative to date.  Started on Solu-Cortef for suspected adrenal insufficiency in setting of hypotension.  Which has been weaned off.  PT consulted for hydrotherapy of decubitus ulcer.  Palliative care consulted for pain management started on PCA Dilaudid pump 10/23/2017.Patient continues on Dilaudid PCA 2. UTI-urine culture grew  greater than 100,000 colonies of Acinetobacter calcoaceticus/baumannii, multidrug resistant.  ID was consulted patient started on IV Unasyn.  Unasyn will be discontinued as it was supposed to given for 3 days only. WBc is elevated to 18000, follow cbc in am. 3. 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  Continue local wound care. Discussed with Dr Luciana Axe, no further antibiotics needed, as the debridement did not find pyocele in the scrotum. 4. Atrial fibrillation with RVR-chads vas score is at least 4.  Xarelto was held on this admission for procedure.  Restarted Xarelto, metoprolol 12.5 mg p.o. twice daily 5. Chronic microcytic anemia-baseline hemoglobin is around 8.  Positive FOBT on admission, but patient had large decubitus ulcer and hemoglobin remained stable.  Today hemoglobin is 6.8, 1 unit PRBC has been ordered.  Follow CBC in a.m.  Repeat FOBT ordered if positive consider GI consultation. 6. Chronic diastolic CHF-euvolemic.  Stable 7. Diabetes mellitus-hemoglobin A1c 6.1, continue Levemir sliding scale insulin NovoLog. 8. CAD-continue Lipitor, aspirin was held, aspirin has been restarted    DVT prophylaxis: SCD  Code Status: DNR  Family Communication: No family at bedside  Disposition Plan: likely skilled facility in next 24 to 48 hours   Consultants:  General surgery   ID  Urology  Palliative care    Procedures: None   Antibiotics:   Anti-infectives (From admission, onward)   Start     Dose/Rate Route Frequency Ordered Stop   10/24/17 1200  Ampicillin-Sulbactam (UNASYN) 3 g in sodium chloride 0.9 % 100 mL IVPB     3 g 200 mL/hr over 30 Minutes Intravenous Every 6 hours 10/24/17 1030 10/27/17 1217  10/22/17 1000  vancomycin (VANCOCIN) 1,250 mg in sodium chloride 0.9 % 250 mL IVPB  Status:  Discontinued     1,250 mg 166.7 mL/hr over 90 Minutes Intravenous Every 12  hours 10/22/17 0958 10/24/17 1030   10/20/17 2200  vancomycin (VANCOCIN) IVPB 1000 mg/200 mL premix  Status:  Discontinued     1,000 mg 200 mL/hr over 60 Minutes Intravenous Every 12 hours 10/20/17 2043 10/22/17 0958   10/20/17 2200  meropenem (MERREM) 1 g in sodium chloride 0.9 % 100 mL IVPB  Status:  Discontinued     1 g 200 mL/hr over 30 Minutes Intravenous Every 8 hours 10/20/17 2043 10/24/17 1030   10/20/17 2000  vancomycin (VANCOCIN) IVPB 1000 mg/200 mL premix  Status:  Discontinued     1,000 mg 200 mL/hr over 60 Minutes Intravenous  Once 10/20/17 1956 10/20/17 2051   10/20/17 1645  piperacillin-tazobactam (ZOSYN) IVPB 3.375 g     3.375 g 100 mL/hr over 30 Minutes Intravenous  Once 10/20/17 1636 10/20/17 1830   10/20/17 1645  vancomycin (VANCOCIN) IVPB 1000 mg/200 mL premix     1,000 mg 200 mL/hr over 60 Minutes Intravenous  Once 10/20/17 1636 10/20/17 1924       Objective   Vitals:   10/30/17 1129 10/30/17 1236 10/30/17 1530 10/30/17 1537  BP: 110/60  (!) 110/59   Pulse: 85  98   Resp: 16 14 16 11   Temp: 98.9 F (37.2 C)  98.5 F (36.9 C)   TempSrc: Oral  Oral   SpO2: 99% 100% 100% 99%  Weight:      Height:        Intake/Output Summary (Last 24 hours) at 10/30/2017 1550 Last data filed at 10/30/2017 1530 Gross per 24 hour  Intake 999 ml  Output 5345 ml  Net -4346 ml   Filed Weights   10/27/17 0631 10/28/17 0533 10/29/17 0500  Weight: 122.8 kg (270 lb 11.6 oz) 122.7 kg (270 lb 8.1 oz) 125.8 kg (277 lb 5.4 oz)     Physical Examination:  Physical Exam:  Mouth: Oral mucosa is moist, no lesions on palate,  Neck: Supple, no deformities, masses, or tenderness Lungs: Normal respiratory effort, bilateral clear to auscultation, no crackles or wheezes.  Heart: Regular rate and rhythm, S1 and S2 normal, no murmurs, rubs auscultated Abdomen: BS normoactive,soft,nondistended,non-tender to palpation,no organomegaly Extremities: Right AKA Neuro : Alert and oriented to  time, place and person, No focal deficits     Data Reviewed: I have personally reviewed following labs and imaging studies  CBG: Recent Labs  Lab 10/29/17 1202 10/29/17 1639 10/29/17 2057 10/30/17 0813 10/30/17 1223  GLUCAP 161* 120* 125* 95 169*    CBC: Recent Labs  Lab 10/25/17 0516 10/26/17 0025 10/27/17 0521 10/28/17 0547 10/30/17 0540  WBC 19.9* 19.9* 15.7* 18.5* 16.6*  HGB 8.2* 8.1* 8.7* 9.1* 6.8*  HCT 27.6* 28.0* 30.6* 30.7* 22.3*  MCV 79.8 79.3 80.1 79.3 77.7*  PLT 568* 560* 596* 645* 527*    Basic Metabolic Panel: Recent Labs  Lab 10/24/17 0531 10/25/17 0516 10/26/17 0025 10/27/17 0521 10/28/17 0547 10/29/17 0529 10/30/17 0540  NA 137 141 144 145  --   --  141  K 3.4* 3.3* 3.9 3.8  --   --  3.5  CL 103 106 109 107  --   --  103  CO2 25 27 28 29   --   --  30  GLUCOSE 326* 161* 116* 149*  --   --  81  BUN 11 9 8 8   --   --  11  CREATININE 0.81 0.80 0.68 0.68 0.68 0.68 0.77  CALCIUM 7.3* 7.4* 7.2* 7.3*  --   --  6.4*  MG  --  1.6* 1.9  --   --   --   --     Recent Results (from the past 240 hour(s))  Culture, blood (Routine x 2)     Status: None   Collection Time: 10/20/17  4:30 PM  Result Value Ref Range Status   Specimen Description   Final    BLOOD LEFT ANTECUBITAL Performed at [facility], [address] [place], [place]    Special Requests   Final    BOTTLES DRAWN AEROBIC AND ANAEROBIC Blood Culture adequate volume Performed at [facility], [address], [place], [place]    Culture   Final    NO GROWTH 5 DAYS Performed at [facility], [address], [place], [place]    Report Status 10/25/2017 FINAL  Final  Urine culture     Status: Abnormal   Collection Time: 10/20/17  4:58 PM  Result Value Ref Range Status   Specimen Description   Final    URINE, RANDOM Performed at [facility], [address] [place], [place]     Special Requests   Final    NONE Performed at [facility], [address], [place], [place]    Culture (A)  Final    >=100,000 COLONIES/mL ACINETOBACTER CALCOACETICUS/BAUMANNII COMPLEX   Report Status 10/24/2017 FINAL  Final   Organism ID, Bacteria ACINETOBACTER CALCOACETICUS/BAUMANNII COMPLEX (A)  Final      Susceptibility   Acinetobacter calcoaceticus/baumannii complex - MIC*    CEFTAZIDIME >=64 RESISTANT Resistant     CEFTRIAXONE 32 INTERMEDIATE Intermediate     CIPROFLOXACIN >=4 RESISTANT Resistant     GENTAMICIN 8 INTERMEDIATE Intermediate     IMIPENEM >=16 RESISTANT Resistant     PIP/TAZO >=128 RESISTANT Resistant     TRIMETH/SULFA >=320 RESISTANT Resistant     CEFEPIME 32 RESISTANT Resistant     AMPICILLIN/SULBACTAM RESISTANT Resistant     * >=100,000 COLONIES/mL ACINETOBACTER CALCOACETICUS/BAUMANNII COMPLEX  MRSA PCR Screening     Status: Abnormal   Collection Time: 10/21/17  1:12 AM  Result Value Ref Range Status   MRSA by PCR POSITIVE (A) NEGATIVE Final    Comment:        The GeneXpert MRSA Assay (FDA approved for NASAL specimens only), is one component of a comprehensive MRSA colonization surveillance program. It is not intended to diagnose MRSA infection nor to guide or monitor treatment for MRSA infections. RESULT CALLED TO, READ BACK BY AND VERIFIED WITH: 12/21/17 @0525  10/21/17 MKELLY Performed at [facility], [address] [place], [place]      Liver Function Tests: Recent Labs  Lab 10/27/17 0521  AST 36  ALT 33  ALKPHOS 99  BILITOT 0.7  PROT 6.5  ALBUMIN 1.9*   No results for input(s): LIPASE, AMYLASE in the last 168 hours. No results for input(s): AMMONIA in the last 168 hours.  Cardiac Enzymes: No results for input(s): CKTOTAL, CKMB, CKMBINDEX, TROPONINI in the last 168 hours. BNP (last 3 results) Recent Labs    01/22/17 0509 03/14/17 0412 09/01/17 0237  BNP 183.2* 414.8*  1,401.6*    ProBNP (last 3 results) No results for input(s): PROBNP in  the last 8760 hours.    Studies: No results found.  Scheduled Meds: . aspirin EC  81 mg Oral Daily  . atorvastatin  80 mg Oral q1800  . calcium-vitamin D  1 tablet Oral Daily  . furosemide  20 mg Intravenous Q12H  . gabapentin  600 mg Oral BID  . guaiFENesin  600 mg Oral BID  . HYDROmorphone   Intravenous Q4H  . insulin aspart  0-9 Units Subcutaneous TID WC  . insulin aspart  3 Units Subcutaneous TID WC  . insulin detemir  10 Units Subcutaneous BID  . metoprolol tartrate  12.5 mg Oral BID  . multivitamin with minerals  1 tablet Oral Daily  . pantoprazole  40 mg Oral Daily  . rivaroxaban  20 mg Oral Q supper  . sodium chloride flush  3 mL Intravenous Q12H  . sodium hypochlorite   Topical BID      Time spent: 25 min  Meredeth Ide   [name] Pager [phone]. If 7PM-7AM, please contact night-coverage at [email or web address], Office  [phone]  password [email or web address]  10/30/2017, 3:50 PM  LOS: 10 days

## 2017-10-31 ENCOUNTER — Inpatient Hospital Stay (HOSPITAL_COMMUNITY): Payer: Medicare Other

## 2017-10-31 ENCOUNTER — Encounter (HOSPITAL_COMMUNITY): Payer: Self-pay | Admitting: Radiology

## 2017-10-31 LAB — BASIC METABOLIC PANEL
Anion gap: 11 (ref 5–15)
BUN: 12 mg/dL (ref 6–20)
CHLORIDE: 98 mmol/L — AB (ref 101–111)
CO2: 29 mmol/L (ref 22–32)
Calcium: 7.1 mg/dL — ABNORMAL LOW (ref 8.9–10.3)
Creatinine, Ser: 0.77 mg/dL (ref 0.61–1.24)
GFR calc non Af Amer: >60 mL/min (ref >60)
Glucose, Bld: 109 mg/dL — ABNORMAL HIGH (ref 65–99)
Potassium: 3.1 mmol/L — ABNORMAL LOW (ref 3.5–5.1)
SODIUM: 138 mmol/L (ref 135–145)

## 2017-10-31 LAB — HEMOGLOBIN AND HEMATOCRIT, BLOOD
HEMATOCRIT: 26.5 % — AB (ref 39.0–52.0)
Hemoglobin: 8.2 g/dL — ABNORMAL LOW (ref 13.0–17.0)

## 2017-10-31 LAB — CBC
HCT: 25.5 % — ABNORMAL LOW (ref 39.0–52.0)
HEMOGLOBIN: 7.9 g/dL — AB (ref 13.0–17.0)
MCH: 24.2 pg — ABNORMAL LOW (ref 26.0–34.0)
MCHC: 31 g/dL (ref 30.0–36.0)
MCV: 78.2 fL (ref 78.0–100.0)
Platelets: 526 10*3/uL — ABNORMAL HIGH (ref 150–400)
RBC: 3.26 MIL/uL — ABNORMAL LOW (ref 4.22–5.81)
RDW: 21.3 % — ABNORMAL HIGH (ref 11.5–15.5)
WBC: 16 10*3/uL — ABNORMAL HIGH (ref 4.0–10.5)

## 2017-10-31 LAB — GLUCOSE, CAPILLARY
GLUCOSE-CAPILLARY: 120 mg/dL — AB (ref 65–99)
Glucose-Capillary: 203 mg/dL — ABNORMAL HIGH (ref 65–99)
Glucose-Capillary: 169 mg/dL — ABNORMAL HIGH (ref 65–99)

## 2017-10-31 MED ORDER — IOHEXOL 300 MG/ML  SOLN
100.0000 mL | Freq: Once | INTRAMUSCULAR | Status: AC | PRN
  Administered 2017-10-31: 100 mL via INTRAVENOUS

## 2017-10-31 MED ORDER — IOPAMIDOL (ISOVUE-300) INJECTION 61%
INTRAVENOUS | Status: AC
  Filled 2017-10-31: qty 30

## 2017-10-31 MED ORDER — POTASSIUM CHLORIDE 10 MEQ/100ML IV SOLN
10.0000 meq | INTRAVENOUS | Status: AC
  Administered 2017-10-31: 10 meq via INTRAVENOUS
  Filled 2017-10-31: qty 100

## 2017-10-31 MED ORDER — IOPAMIDOL (ISOVUE-300) INJECTION 61%: 30.0000 mL | Freq: Once | INTRAVENOUS | Status: DC

## 2017-10-31 MED ORDER — JUVEN PO PACK
1.0000 | PACK | Freq: Two times a day (BID) | ORAL | Status: DC
  Administered 2017-10-31 – 2017-11-01 (×3): 1 via ORAL
  Filled 2017-10-31 (×3): qty 1

## 2017-10-31 MED ORDER — IOPAMIDOL (ISOVUE-300) INJECTION 61%: 100.0000 mL | Freq: Once | INTRAVENOUS | Status: DC | PRN

## 2017-10-31 NOTE — Progress Notes (Signed)
Patient refused second dressing change for the night. Encouraged pt dressing needs to be changed. Stated possibly later.

## 2017-10-31 NOTE — Progress Notes (Signed)
Daily Progress Note   Patient Name: [name]       Date: 10/31/2017 DOB: 21-Apr-1951  Age: 67 y.o. MRN#: [id number] Attending Physician: Meredeth Ide, MD Primary Care Physician: [name], MD (Inactive) Admit Date: 10/20/2017  Reason for Consultation/Follow-up: Establishing goals of care and Pain control  Subjective: [name] is being taken for his CT to look for any un drained areas, he continues to complain of pain    [name] continues to endorse that he doesn't want to de escalate PCA, he states that his pain at times, is still severely uncontrolled.      See below.  Length of Stay: 11  Current Medications: Scheduled Meds:  . aspirin EC  81 mg Oral Daily  . atorvastatin  80 mg Oral q1800  . calcium-vitamin D  1 tablet Oral Daily  . furosemide  20 mg Intravenous Q12H  . gabapentin  600 mg Oral BID  . guaiFENesin  600 mg Oral BID  . HYDROmorphone   Intravenous Q4H  . insulin aspart  0-9 Units Subcutaneous TID WC  . insulin aspart  3 Units Subcutaneous TID WC  . insulin detemir  10 Units Subcutaneous BID  . iopamidol  30 mL Oral Once  . iopamidol      . metoprolol tartrate  12.5 mg Oral BID  . multivitamin with minerals  1 tablet Oral Daily  . nutrition supplement (JUVEN)  1 packet Oral BID BM  . pantoprazole  40 mg Oral Daily  . rivaroxaban  20 mg Oral Q supper  . sodium chloride flush  3 mL Intravenous Q12H    Continuous Infusions: . sodium chloride    . potassium chloride      PRN Meds: acetaminophen **OR** acetaminophen, albuterol, diphenhydrAMINE **OR** diphenhydrAMINE, HYDROmorphone (DILAUDID) injection, naloxone **AND** sodium chloride flush, ondansetron **OR** ondansetron (ZOFRAN) IV, polyvinyl alcohol, white petrolatum  Physical Exam        .    Obese, pale in appearance  Cardiovascular: Normal rate. An irregularly irregular rhythm present.  Pulmonary/Chest: No accessory muscle usage. No tachypnea. No respiratory distress. regular breaths  Abdominal: Soft. Normal appearance.  Pictures of wound noted Has heel dressing too  Vital Signs: BP 121/82 (BP Location: Right Wrist)   Pulse (!) 119   Temp (!) 100.6 F (38.1  C) (Oral)   Resp 15   Ht 6' (1.829 m)   Wt 125.8 kg (277 lb 5.4 oz)   SpO2 100%   BMI 37.61 kg/m  SpO2: SpO2: 100 % O2 Device: O2 Device: Nasal Cannula O2 Flow Rate: O2 Flow Rate (L/min): 2.5 L/min  Intake/output summary:   Intake/Output Summary (Last 24 hours) at 10/31/2017 1457 Last data filed at 10/31/2017 1000 Gross per 24 hour  Intake 222 ml  Output 5110 ml  Net -4888 ml   LBM: Last BM Date: 10/30/17 Baseline Weight: Weight: 121.6 kg (268 lb) Most recent weight: Weight: 125.8 kg (277 lb 5.4 oz)      PPS 40% Palliative Assessment/Data:    Flowsheet Rows     Most Recent Value  Intake Tab  Referral Department  Hospitalist  Unit at Time of Referral  Med/Surg Unit  Palliative Care Primary Diagnosis  Sepsis/Infectious Disease  Date Notified  10/21/17  Palliative Care Type  Return patient Palliative Care  Reason for referral  Clarify Goals of Care, Pain  Date of Admission  10/20/17  Date first seen by Palliative Care  10/22/17  # of days Palliative referral response time  1 Day(s)  # of days IP prior to Palliative referral  1  Clinical Assessment  Palliative Performance Scale Score  50%  Pain Max last 24 hours  9  Pain Min Last 24 hours  3  Psychosocial & Spiritual Assessment  Palliative Care Outcomes      Patient Active Problem List   Diagnosis Date Noted  . Protein-calorie malnutrition, severe (HCC) 10/20/2017  . Sepsis (HCC) 10/20/2017  . Occult blood in stools 10/20/2017  . C. difficile diarrhea 10/12/2017  . Hypomagnesemia 10/12/2017  . Septic shock (HCC) 09/29/2017  . Acute  encephalopathy   . Palliative care encounter   . UTI (urinary tract infection) 09/01/2017  . Acute metabolic encephalopathy 09/01/2017  . S/P AKA (above knee amputation) unilateral, right (HCC) 09/01/2017  . Atrial fibrillation with rapid ventricular response (HCC)   . Hyponatremia 04/11/2017  . ARF (acute renal failure) (HCC) 04/11/2017  . Pressure injury of skin 03/24/2017  . Below knee amputation status, right (HCC) 03/23/2017  . DKA (diabetic ketoacidoses) (HCC) 03/14/2017  . Elevated troponin 03/14/2017  . Diabetic polyneuropathy associated with type 2 diabetes mellitus (HCC)   . Osteoarthritis of both knees   . Diabetic nephropathy associated with type 2 diabetes mellitus (HCC)   . Bilateral lower leg cellulitis 01/22/2017  . Venous stasis 08/06/2016  . At high risk for falls 07/21/2016  . CHF exacerbation (HCC) 06/10/2016  . Lactic acidosis 06/09/2016  . Syncope and collapse 03/05/2016  . Acute on chronic renal insufficiency 03/05/2016  . Obesity 12/30/2015  . Scrotal swelling   . Foot fracture   . 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 Lower GI bleed   . Pressure ulcer 12/24/2015  . Acute kidney injury (HCC) 12/23/2015  . Back pain 12/23/2015  . Fall   . Metatarsal fracture   . Traumatic rhabdomyolysis (HCC)   . Hyperkalemia   . Lisfranc dislocation   . Hyperglycemia   . Sepsis due to skin infection (HCC) 09/08/2015  . Scrotal wall abscess 09/08/2015  . AKI (acute kidney injury) (HCC) 09/08/2015  . Chest wall pain   . On amiodarone therapy 12/08/2014  . Respiratory failure with hypoxia (HCC) 12/04/2014  . Hypoxia 12/04/2014  . Acute on chronic respiratory failure with hypoxia (HCC)   . Atrial fibrillation  with RVR (HCC) 09/20/2014  . Chronic diastolic CHF (congestive heart failure) (HCC) 09/19/2014  . Acute respiratory failure (HCC) 09/19/2014  . Abnormal nuclear cardiac imaging test 02/12/2014  . Dyspnea 02/12/2014  . Chronic  anticoagulation- Coumadin 02/12/2014  . CKD (chronic kidney disease) stage 3, GFR 30-59 ml/min (HCC) 02/12/2014  . Chronic diastolic congestive heart failure (HCC) 02/11/2014  . Encounter for therapeutic drug monitoring 08/03/2013  . Pilonidal cyst 01/25/2013  . Perirectal cellulitis 09/06/2012  . Long term (current) use of anticoagulants 11/18/2011  . Obstructive sleep apnea- unable to tolerate c-pap 08/04/2011  . CAD S/P percutaneous coronary angioplasty 08/04/2011  . ANEMIA-IRON DEFICIENCY 01/06/2009  . OTHER AND UNSPECIFIED COAGULATION DEFECTS 01/06/2009  . Anemia 01/02/2009  . CARDIOVASCULAR FUNCTION STUDY, ABNORMAL 01/02/2009  . Essential hypertension, benign 10/22/2008  . Atrial fibrillation 10/22/2008  . Hyperlipidemia 09/23/2008  . Hypokalemia 09/23/2008  . Obesity, Class III, BMI 40-49.9 (morbid obesity) (HCC) 09/23/2008  . CELLULITIS AND ABSCESS OF LEG EXCEPT FOOT 09/23/2008    Palliative Care Assessment & Plan   Patient Profile: 67 y.o. male  with past medical history of hypertension, hyperlipidemia, diabetes mellitus, atrial fibrillation onXarelto, OSA not on CPAP, GI bleeding,dCHF,COPD, CKD3,s/p of right AKA admitted on 10/20/2017 with sepsis, a fib, and concern for GI bleeding.  This is his 3rd admission in 6 months and palliative was involved in care during recent admission with discharge on 4/5.  Palliative consulted for goals of care and assistance with pain management.  Recommendations/Plan: -Pain: remains on PCA as per patient request.     follow up results of repeat CT scan. Surgery following.     patient's goals are not comfort-only/hospice focused, at this time. He wishes to continue with IV Antibiotics, wound care and hydrotherapy. He has been asking about [facility]. Case manager to request consult from [facility]ACH/ [facility]. Await further information.   Continue current mode of care for now. Overall goals of care and disposition remains a complex and challenging  issue. PMT to continue to follow. LTACH consult pending.   Patient has complex wound , high pain management needs. Will need to monitor hospital course and disease trajectory and engage further with him about his goals of care.     Code Status:    Code Status Orders  (From admission, onward)        Start     Ordered   10/20/17 1953  Do not attempt resuscitation (DNR)  Continuous    Question Answer Comment  In the event of cardiac or respiratory ARREST Do not call a "code blue"   In the event of cardiac or respiratory ARREST Do not perform Intubation, CPR, defibrillation or ACLS   In the event of cardiac or respiratory ARREST Use medication by any route, position, wound care, and other measures to relive pain and suffering. May use oxygen, suction and manual treatment of airway obstruction as needed for comfort.      10/20/17 1956    Code Status History    Date Active Date Inactive Code Status Order ID Comments User Context   09/29/2017 0457 10/15/2017 0232 Full Code [id number]  Erin Fulling, MD ED   09/08/2017 1026 09/13/2017 2008 DNR [id number]  Alwyn Ren, MD Inpatient   09/01/2017 0535 09/08/2017 1026 Full Code [id number]  Lorretta Harp, MD ED   04/11/2017 0425 04/22/2017 0201 DNR [id number]  Pearson Grippe, MD ED   03/23/2017 1511 03/28/2017 2216 DNR [id number]  Nadara Mustard, MD Inpatient   03/14/2017 [phone] 03/18/2017  2212 Full Code [id number]  Ozella Rocks, MD ED   03/14/2017 0831 03/14/2017 0831 Full Code [id number]  Ozella Rocks, MD ED   01/22/2017 0857 01/25/2017 2139 Full Code [id number]  Jordan Hawks, DO Inpatient   06/10/2016 0946 06/18/2016 0030 Full Code [id number]  Therisa Doyne, MD Inpatient   12/23/2015 2049 12/30/2015 1821 DNR [id number]  Casey Burkitt, MD Inpatient   09/08/2015 2156 09/17/2015 2240 Full Code [id number]  Ron Parker, MD Inpatient   07/08/2015 2124 07/10/2015 1811 Full Code [id number]  Lorretta Harp, MD ED   12/04/2014 2303 12/11/2014 2124  Full Code [id number]  Pearson Grippe, MD Inpatient   09/19/2014 1806 09/24/2014 1845 Full Code [id number]  Corrie Dandy Inpatient   02/11/2014 1557 02/14/2014 1629 Full Code [id number]  Rosalio Macadamia, NP Inpatient   09/07/2012 0131 09/10/2012 1731 Full Code [id number]  Eduard Clos, MD Inpatient       Prognosis:   Unable to determine  Discharge Planning:  LTACH/SNF  Care plan was discussed with patient, RN and [name]    Thank you for allowing the Palliative Medicine Team to assist in the care of this patient.   Total Time 25 Prolonged Time Billed No      Greater than 50%  of this time was spent counseling and coordinating care related to the above assessment and plan.  Rosalin Hawking, MD [phone] Please contact Palliative Medicine Team phone at [phone] for questions and concerns.

## 2017-10-31 NOTE — Progress Notes (Signed)
Nutrition Follow-up  DOCUMENTATION CODES:   Obesity unspecified  INTERVENTION:  - Continue Magic Cup BID. - Will order 1 packet of Juven BID, each packet provides 80 calories and 14 grams of amino acids and CaHMB, glutamine, and arginine, to promote wound healing. - Continue to encourage PO intakes.    NUTRITION DIAGNOSIS:   Increased nutrient needs related to acute illness, wound healing as evidenced by estimated needs. -ongoing  GOAL:   Patient will meet greater than or equal to 90% of their needs -unmet on average.  MONITOR:   PO intake, Supplement acceptance, Weight trends, Labs, Skin  ASSESSMENT:   67 y.o. male with medical history significant for afib, CAD, insulin-dependent DM, diastolic CHF, chronic low back pain, and microcytic anemia. He presented to the ED from SNF for evaluation of fevers and decreased responsiveness. Patient was discharged from the hospital 1 week ago after management of septic shock and VDRF, suspected secondary to aspiration PNA and complicated by C. difficile infection. He had reportedly been recovering well at his SNF before he was noted to be listless and febrile on day of admission. Patient had not been expressing any specific complaints leading up to this and there has not been any cough or vomiting noted. No diarrhea reported. Nursing reported seeing some blood and having suspicion for possible GI bleed.  Weight +26 lbs/11.8 kg compared to admission weight. Pt continues on Carb Modified diet. Most recently, per review of flow sheet and [facility], he consumed 100% of breakfast (412 kcal, 10 grams of protein) on 4/19, 90% of lunch (475 kcal, 17 grams of protein) on 4/21, and 100% of breakfast (400 kcal, 12 grams of protein) and 85% of lunch (777 kcal, 37 grams of protein) on 4/21.   Per Dr. Daune Perch note this afternoon: severe sepsis now resolved, PT consulted for hydrotherapy of sacral ulcer, pt on Dilaudid PCA.  Medications reviewed; 1 tablet  Oscal-D/day, 20 mg IV Lasix BID, sliding scale Novolog, 3 unit Novolog TID, 10 units Levemir BID, daily multivitamin with minerals, 40 mg oral Protonix/day, 10 mEq IV KCl x3 runs today. Labs reviewed; CBG: 120 mg/dL, K: 3.1 mmol/L, Cl: 98 mmol/L, Ca: 7.1 mg/dL.    Diet Order:  Diet Carb Modified Fluid consistency: Thin; Room service appropriate? Yes  EDUCATION NEEDS:   No education needs have been identified at this time  Skin:  Skin Assessment: Skin Integrity Issues: Skin Integrity Issues:: Stage IV Stage IV: sacrum Incisions: R leg (2/21); bilateral scrotum (3/28)  Last BM:  4/21  Height:   Ht Readings from Last 1 Encounters:  10/21/17 6' (1.829 m)    Weight:   Wt Readings from Last 1 Encounters:  10/29/17 277 lb 5.4 oz (125.8 kg)    Ideal Body Weight:  80.91 kg  BMI:  Body mass index is 37.61 kg/m.  Estimated Nutritional Needs:   Kcal:  2050-2280 (18-20 kcal/kg)  Protein:  135-148 grams (1.2-1.3 grams/kg)  Fluid:  >/= 2.2 L/day      Trenton Gammon, MS, RD, LDN, [facility] Inpatient Clinical Dietitian Pager # [phone] After hours/weekend pager # [phone]

## 2017-10-31 NOTE — Plan of Care (Signed)
  Problem: Elimination: Goal: Will not experience complications related to bowel motility Outcome: Progressing   Problem: Education: Goal: Knowledge of disease or condition will improve Outcome: Progressing Goal: Understanding of medication regimen will improve Outcome: Progressing   Problem: Activity: Goal: Ability to tolerate increased activity will improve Outcome: Progressing   Problem: Cardiac: Goal: Ability to achieve and maintain adequate cardiopulmonary perfusion will improve Outcome: Progressing

## 2017-10-31 NOTE — Progress Notes (Signed)
[facility]  PROGRESS NOTE  [name] HEN:[id number] DOB: 1951/01/16 DOA: 10/20/2017 PCP: [name], MD (Inactive)   Brief HPI:   67 y.o.malewith medical history significant foratrial fibrillation on Xarelto, coronary artery disease, insulin-dependent diabetes mellitus, chronic diastolic CHF, chronic low back pain, and microcytic anemia, now presenting to the emergency department from his SNF for evaluation of fevers and decreased responsiveness. Patient was discharged from the hospital 1 week ago after management of septic shock and ventilator dependent respiratory failure, suspected secondary to aspiration pneumonia and complicated by C. difficile infection. He had reportedly been recovering well at his SNF before he was noted to be listless and febrile on 4/11.  In the emergency department, he was found to be in atrial fibrillation with RVR, and concern for severe sepsis with lactic acid 4.1.  He was started on empiric vancomycin, merrem and admitted to stepdown unit. With improvement in HR, he was transitioned off cardizem gtt and transferred to telemetry unit.    Subjective   Patient seen and examined, continues to have pain during hydrotherapy. On Dilaudid PCA, S/p 1 unit PRBC. Hb is 7.9   Assessment/Plan:     1. 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  No clear abscess was noted per urology.  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  Blood cultures are negative to date.  Started on Solu-Cortef for suspected adrenal insufficiency in setting of hypotension.  Which has been weaned off.  PT consulted for hydrotherapy of decubitus ulcer.  Palliative care consulted for pain management started on PCA Dilaudid pump 10/23/2017.Patient continues on Dilaudid  PCA  2. UTI-urine culture grew greater than 100,000 colonies of Acinetobacter calcoaceticus/baumannii, multidrug resistant.  ID was consulted patient started on IV Unasyn.  Unasyn will be discontinued as it was supposed to given for 3 days only. WBC is elevated to [address]000, follow cbc in am.  3. 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  Continue local wound care. Discussed with Dr Luciana Axe, no further antibiotics needed, as the debridement did not find pyocele in the scrotum. Continue wound care with hydrotherapy.  4. Atrial fibrillation with RVR-chads vas score is at least 4.  Xarelto was held on this admission for procedure.  Restarted Xarelto, metoprolol 12.5 mg p.o. twice daily  5. Chronic microcytic anemia-baseline hemoglobin is around 8.  Positive FOBT on admission, but patient had large decubitus ulcer and hemoglobin remained stable. Patient will have soiling of wound with feces, and has fistula, he is going to have positive FOBT. Hard to tell whether GI bleed vs blood from wound, he will need formal GI evaluation when the wound is fairly well healed.  6. Chronic diastolic CHF-euvolemic.  Stable  7. Diabetes mellitus-hemoglobin A1c 6.1, continue Levemir sliding scale insulin NovoLog.  8. CAD-continue Lipitor, aspirin was held, aspirin has been restarted  9. Hypokalemia- potassium is 3.1, will give KCL 10 meq  IV x3. Follow BMP in am.    DVT prophylaxis: xarelto  Code Status: DNR  Family Communication: No family at bedside  Disposition Plan: likely skilled facility in next 24 to 48 hours   Consultants:  General surgery   ID  Urology  Palliative care    Procedures: None   Antibiotics:   Anti-infectives (  From admission, onward)   Start     Dose/Rate Route Frequency Ordered Stop   10/24/17 1200  Ampicillin-Sulbactam (UNASYN) 3 g in sodium chloride 0.9 % 100 mL IVPB      3 g 200 mL/hr over 30 Minutes Intravenous Every 6 hours 10/24/17 1030 10/27/17 1217   10/22/17 1000  vancomycin (VANCOCIN) 1,250 mg in sodium chloride 0.9 % 250 mL IVPB  Status:  Discontinued     1,250 mg 166.7 mL/hr over 90 Minutes Intravenous Every 12 hours 10/22/17 0958 10/24/17 1030   10/20/17 2200  vancomycin (VANCOCIN) IVPB 1000 mg/200 mL premix  Status:  Discontinued     1,000 mg 200 mL/hr over 60 Minutes Intravenous Every 12 hours 10/20/17 2043 10/22/17 0958   10/20/17 2200  meropenem (MERREM) 1 g in sodium chloride 0.9 % 100 mL IVPB  Status:  Discontinued     1 g 200 mL/hr over 30 Minutes Intravenous Every 8 hours 10/20/17 2043 10/24/17 1030   10/20/17 2000  vancomycin (VANCOCIN) IVPB 1000 mg/200 mL premix  Status:  Discontinued     1,000 mg 200 mL/hr over 60 Minutes Intravenous  Once 10/20/17 1956 10/20/17 2051   10/20/17 1645  piperacillin-tazobactam (ZOSYN) IVPB 3.375 g     3.375 g 100 mL/hr over 30 Minutes Intravenous  Once 10/20/17 1636 10/20/17 1830   10/20/17 1645  vancomycin (VANCOCIN) IVPB 1000 mg/200 mL premix     1,000 mg 200 mL/hr over 60 Minutes Intravenous  Once 10/20/17 1636 10/20/17 1924       Objective   Vitals:   10/31/17 0000 10/31/17 0430 10/31/17 0544 10/31/17 1218  BP:   121/82   Pulse:   (!) 119   Resp: 11 14 11 15   Temp:   (!) 100.6 F (38.1 C)   TempSrc:   Oral   SpO2: 98% 100% 100% 100%  Weight:      Height:        Intake/Output Summary (Last 24 hours) at 10/31/2017 1303 Last data filed at 10/31/2017 1000 Gross per 24 hour  Intake 462 ml  Output 5110 ml  Net -4648 ml   Filed Weights   10/27/17 0631 10/28/17 0533 10/29/17 0500  Weight: 122.8 kg (270 lb 11.6 oz) 122.7 kg (270 lb 8.1 oz) 125.8 kg (277 lb 5.4 oz)     Physical Examination:    Mouth: Oral mucosa is moist, no lesions on palate,  Neck: Supple, no deformities, masses, or tenderness Lungs: Normal respiratory effort, bilateral clear to auscultation, no crackles or  wheezes.  Heart: Regular rate and rhythm, S1 and S2 normal, no murmurs, rubs auscultated Abdomen: BS normoactive,soft,nondistended,non-tender to palpation,no organomegaly Extremities: s/p right AKA Neuro : Alert and oriented to time, place and person, No focal deficits       Data Reviewed: I have personally reviewed following labs and imaging studies  CBG: Recent Labs  Lab 10/30/17 0813 10/30/17 1223 10/30/17 1704 10/30/17 2113 10/31/17 0727  GLUCAP 95 169* 142* 169* 120*    CBC: Recent Labs  Lab 10/26/17 0025 10/27/17 0521 10/28/17 0547 10/30/17 0540 10/31/17 0609  WBC 19.9* 15.7* 18.5* 16.6* 16.0*  HGB 8.1* 8.7* 9.1* 6.8* 7.9*  HCT 28.0* 30.6* 30.7* 22.3* 25.5*  MCV 79.3 80.1 79.3 77.7* 78.2  PLT 560* 596* 645* 527* 526*    Basic Metabolic Panel: Recent Labs  Lab 10/25/17 0516 10/26/17 0025 10/27/17 0521 10/28/17 0547 10/29/17 0529 10/30/17 0540 10/31/17 0609  NA 141 144 145  --   --  141 138  K 3.3* 3.9 3.8  --   --  3.5 3.1*  CL 106 109 107  --   --  103 98*  CO2 27 28 29   --   --  30 29  GLUCOSE 161* 116* 149*  --   --  81 109*  BUN 9 8 8   --   --  11 12  CREATININE 0.80 0.68 0.68 0.68 0.68 0.77 0.77  CALCIUM 7.4* 7.2* 7.3*  --   --  6.4* 7.1*  MG 1.6* 1.9  --   --   --   --   --     No results found for this or any previous visit (from the past 240 hour(s)).   Liver Function Tests: Recent Labs  Lab 10/27/17 0521  AST 36  ALT 33  ALKPHOS 99  BILITOT 0.7  PROT 6.5  ALBUMIN 1.9*   No results for input(s): LIPASE, AMYLASE in the last 168 hours. No results for input(s): AMMONIA in the last 168 hours.  Cardiac Enzymes: No results for input(s): CKTOTAL, CKMB, CKMBINDEX, TROPONINI in the last 168 hours. BNP (last 3 results) Recent Labs    01/22/17 0509 03/14/17 0412 09/01/17 0237  BNP 183.2* 414.8* 1,401.6*    ProBNP (last 3 results) No results for input(s): PROBNP in the last 8760 hours.    Studies: No results  found.  Scheduled Meds: . aspirin EC  81 mg Oral Daily  . atorvastatin  80 mg Oral q1800  . calcium-vitamin D  1 tablet Oral Daily  . furosemide  20 mg Intravenous Q12H  . gabapentin  600 mg Oral BID  . guaiFENesin  600 mg Oral BID  . HYDROmorphone   Intravenous Q4H  . insulin aspart  0-9 Units Subcutaneous TID WC  . insulin aspart  3 Units Subcutaneous TID WC  . insulin detemir  10 Units Subcutaneous BID  . iopamidol  30 mL Oral Once  . iopamidol      . metoprolol tartrate  12.5 mg Oral BID  . multivitamin with minerals  1 tablet Oral Daily  . pantoprazole  40 mg Oral Daily  . rivaroxaban  20 mg Oral Q supper  . sodium chloride flush  3 mL Intravenous Q12H      Time spent: 25 min  Meredeth Ide   [name] Pager [phone]. If 7PM-7AM, please contact night-coverage at [email or web address], Office  [phone]  password [email or web address]  10/31/2017, 1:03 PM  LOS: 11 days

## 2017-10-31 NOTE — Progress Notes (Signed)
[facility] Progress Note  6 Days Post-Op  Subjective: CC- sacral decubitus and scrotal abscess Patient continues to have significant pain in surgical area. He remains on PCA. Currently undergoing hydrotherapy.  Objective: Vital signs in last 24 hours: Temp:  [98.5 F (36.9 C)-100.6 F (38.1 C)] 100.6 F (38.1 C) (04/22 0544) Pulse Rate:  [85-119] 119 (04/22 0544) Resp:  [10-16] 11 (04/22 0544) BP: (110-124)/(59-82) 121/82 (04/22 0544) SpO2:  [97 %-100 %] 100 % (04/22 0544) Last BM Date: 10/27/17  Intake/Output from previous day: 04/21 0701 - 04/22 0700 In: 1461 [P.O.:1146; Blood:315] Out: 5545 [Urine:5370; Stool:175] Intake/Output this shift: No intake/output data recorded.  PE: Gen:  Alert, NAD Pulm:  effort normal GU:        Lab Results:  Recent Labs    10/30/17 0540 10/31/17 0609  WBC 16.6* 16.0*  HGB 6.8* 7.9*  HCT 22.3* 25.5*  PLT 527* 526*   BMET Recent Labs    10/30/17 0540 10/31/17 0609  NA 141 138  K 3.5 3.1*  CL 103 98*  CO2 30 29  GLUCOSE 81 109*  BUN 11 12  CREATININE 0.77 0.77  CALCIUM 6.4* 7.1*   PT/INR No results for input(s): LABPROT, INR in the last 72 hours. CMP     Component Value Date/Time   NA 138 10/31/2017 0609   NA 136 (A) 03/22/2017   K 3.1 (L) 10/31/2017 0609   CL 98 (L) 10/31/2017 0609   CO2 29 10/31/2017 0609   GLUCOSE 109 (H) 10/31/2017 0609   BUN 12 10/31/2017 0609   BUN 34 (A) 03/22/2017   CREATININE 0.77 10/31/2017 0609   CREATININE 1.57 (H) 08/06/2016 1438   CALCIUM 7.1 (L) 10/31/2017 0609   PROT 6.5 10/27/2017 0521   ALBUMIN 1.9 (L) 10/27/2017 0521   AST 36 10/27/2017 0521   ALT 33 10/27/2017 0521   ALKPHOS 99 10/27/2017 0521   BILITOT 0.7 10/27/2017 0521   GFRNONAA >60 10/31/2017 0609   GFRNONAA 46 (L) 08/06/2016 1438   GFRAA >60 10/31/2017 0609   GFRAA 53 (L) 08/06/2016 1438   Lipase  No results found for: LIPASE     Studies/Results: No results  found.  Anti-infectives: Anti-infectives (From admission, onward)   Start     Dose/Rate Route Frequency Ordered Stop   10/24/17 1200  Ampicillin-Sulbactam (UNASYN) 3 g in sodium chloride 0.9 % 100 mL IVPB     3 g 200 mL/hr over 30 Minutes Intravenous Every 6 hours 10/24/17 1030 10/27/17 1217   10/22/17 1000  vancomycin (VANCOCIN) 1,250 mg in sodium chloride 0.9 % 250 mL IVPB  Status:  Discontinued     1,250 mg 166.7 mL/hr over 90 Minutes Intravenous Every 12 hours 10/22/17 0958 10/24/17 1030   10/20/17 2200  vancomycin (VANCOCIN) IVPB 1000 mg/200 mL premix  Status:  Discontinued     1,000 mg 200 mL/hr over 60 Minutes Intravenous Every 12 hours 10/20/17 2043 10/22/17 0958   10/20/17 2200  meropenem (MERREM) 1 g in sodium chloride 0.9 % 100 mL IVPB  Status:  Discontinued     1 g 200 mL/hr over 30 Minutes Intravenous Every 8 hours 10/20/17 2043 10/24/17 1030   10/20/17 2000  vancomycin (VANCOCIN) IVPB 1000 mg/200 mL premix  Status:  Discontinued     1,000 mg 200 mL/hr over 60 Minutes Intravenous  Once 10/20/17 1956 10/20/17 2051   10/20/17 1645  piperacillin-tazobactam (ZOSYN) IVPB 3.375 g     3.375 g 100 mL/hr over 30 Minutes  Intravenous  Once 10/20/17 1636 10/20/17 1830   10/20/17 1645  vancomycin (VANCOCIN) IVPB 1000 mg/200 mL premix     1,000 mg 200 mL/hr over 60 Minutes Intravenous  Once 10/20/17 1636 10/20/17 1924       Assessment/Plan Atrial fibrillation - on Xarelto and metoprolol CAD IDDM Chronic diastolic CHF Chronic low back pain Anemia - required BT 4/21 Code status DNR UTI - completed course of unasyn  Sacral decubitus ulcer S/p DEBRIDEMENT SACRAL DECUBITUS skin, subcutaneous tissue and fascia. Incision drainage and debridement skin and subcutaneous tissue scrotum, 10/25/17, Dr. 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 Modena Slater  - POD 6 - path: ULCERATION WITH SUPPURATIVE INFLAMMATION AND FOCAL NECROSIS, NO EVIDENCE OF MALIGNANCY  FEN: Carb Mod ID: Vancomycin 4/11>>4/15, Zosyn x 1 dose 4/11; Meropenum 4/11>>4/15; Unasyn 4/15>>4/18. None currently per ID DVT: xarelto Foley: In Follow up:Wound clinic   Plan: Patient with persistent leukocytosis and low grade fevers. Will obtain CT scan to evaluate for any undrained fluid collection. Continue BID wet to dry dressing changes and hydrotherapy.   LOS: 11 days    Franne Forts , [facility] 10/31/2017, 10:13 AM Pager: [phone] Consults: [phone] Mon-Fri 7:00 am-4:30 pm Sat-Sun 7:00 am-11:30 am

## 2017-10-31 NOTE — Progress Notes (Addendum)
   10/31/17 1100 Hydrotherapy note [id number]  Subjective Assessment   Subjective "I guess", pt  agrees to PT/hydrotherapy  Patient and Family Stated Goals no pain  Prior Treatments dressing change  Evaluation and Treatment  Evaluation and Treatment Procedures Explained to Patient/Family Yes  Evaluation and Treatment Procedures agreed to  Wound / Incision (Open or Dehisced) 10/22/17 Other (Comment) Sacrum Medial large wound contaminated  with BM  Date First Assessed/Time First Assessed: 10/22/17 1444   Wound Type: Other (Comment)  Location: Sacrum  Location Orientation: Medial  Wound Description (Comments): large wound contaminated  with BM  Present on Admission: Yes  Dressing Type Moist to dry;Gauze (Comment);ABD;Tape dressing (gentle packing to scrotume, covered with ABD)  Dressing Changed New  Dressing Status Clean  Dressing Change Frequency Twice a day  % Wound base Red or Granulating 45%  % Wound base Yellow/Fibrinous Exudate 55%  Wound Length (cm) 12 cm  Wound Width (cm) 15 cm  Wound Depth (cm) 2 cm  Wound Volume (cm^3) 360 cm^3  Wound Surface Area (cm^2) 180 cm^2  Margins Unattached edges (unapproximated)  Drainage Amount Moderate  Drainage Description Odor (bleeding on perimeter, granulation.)  Non-staged Wound Description Full thickness  Incision (Closed) 10/25/17 Other (Comment)  Date First Assessed/Time First Assessed: 10/25/17 1615   Location: Other (Comment)  Dressing Type  (saline)  Hydrotherapy  Pulsed lavage therapy - wound location sacrum  Pulsed Lavage with Suction (psi) 8 psi (4-8)  Pulsed Lavage with Suction - Normal Saline Used 1000 mL  Pulsed Lavage Tip Tip with splash shield  Selective Debridement  Selective Debridement - Location sacrum  Selective Debridement - Tools Used Forceps;Scalpel  Selective Debridement - Tissue Removed slough  Wound Therapy - Assess/Plan/Recommendations  Wound Therapy - Clinical Statement Pt is a 67 year old male with large,  painful, unstageable, sacral pressure injury.  The patient requires extensive assistance for positioning d/t limited UE movement and  R AKA. The patient has resided in SNF for several months and previously received Hydrotherapy for a sacral wound. The patient is on an air mattress; The  patient will benefit from PLS.  Wound continues to have  green and malodorous moderate to copious exudate present on old dressing and bed pad. Dakin's started again and used today. Gentle hydro (low psi d/t pain) and  light packing of the scrotal site as well;  CCS PA in to inspect the wound. Dakin's not needed at this time.  Wound Therapy - Functional Problem List total assistance for mobility.  Factors Delaying/Impairing Wound Healing Incontinence;Multiple medical problems;Immobility  Hydrotherapy Plan Debridement;Dressing change;Patient/family education;Pulsatile lavage with suction  Wound Therapy - Frequency 6X / week  Wound Therapy - Follow Up Recommendations Skilled nursing facility;Other (comment) (?LTACH)  Wound Plan PLS and dressing changes  Wound Therapy Goals - Improve the function of patient's integumentary system by progressing the wound(s) through the phases of wound healing by:  Decrease Necrotic Tissue to 20  Decrease Necrotic Tissue - Progress Progressing toward goal  Increase Granulation Tissue to 80  Increase Granulation Tissue - Progress Progressing toward goal  Goals/treatment plan/discharge plan were made with and agreed upon by patient/family No, Patient unable to participate in goals/treatment/discharge plan and family unavailable  Time For Goal Achievement 2 weeks  Wound Therapy - Potential for Goals Poor  [name] PT [phone]

## 2017-11-01 ENCOUNTER — Inpatient Hospital Stay (HOSPITAL_COMMUNITY): Payer: Medicare Other

## 2017-11-01 ENCOUNTER — Encounter (HOSPITAL_COMMUNITY): Payer: Self-pay

## 2017-11-01 DIAGNOSIS — L0390 Cellulitis, unspecified: Secondary | ICD-10-CM | POA: Diagnosis not present

## 2017-11-01 DIAGNOSIS — J449 Chronic obstructive pulmonary disease, unspecified: Secondary | ICD-10-CM | POA: Diagnosis not present

## 2017-11-01 DIAGNOSIS — N390 Urinary tract infection, site not specified: Secondary | ICD-10-CM | POA: Diagnosis not present

## 2017-11-01 DIAGNOSIS — I482 Chronic atrial fibrillation: Secondary | ICD-10-CM | POA: Diagnosis not present

## 2017-11-01 DIAGNOSIS — L89150 Pressure ulcer of sacral region, unstageable: Secondary | ICD-10-CM | POA: Diagnosis not present

## 2017-11-01 DIAGNOSIS — Z9911 Dependence on respirator [ventilator] status: Secondary | ICD-10-CM | POA: Diagnosis not present

## 2017-11-01 DIAGNOSIS — I130 Hypertensive heart and chronic kidney disease with heart failure and stage 1 through stage 4 chronic kidney disease, or unspecified chronic kidney disease: Secondary | ICD-10-CM | POA: Diagnosis not present

## 2017-11-01 DIAGNOSIS — L89304 Pressure ulcer of unspecified buttock, stage 4: Secondary | ICD-10-CM | POA: Diagnosis not present

## 2017-11-01 DIAGNOSIS — S81801A Unspecified open wound, right lower leg, initial encounter: Secondary | ICD-10-CM | POA: Diagnosis not present

## 2017-11-01 DIAGNOSIS — E1152 Type 2 diabetes mellitus with diabetic peripheral angiopathy with gangrene: Secondary | ICD-10-CM | POA: Diagnosis not present

## 2017-11-01 DIAGNOSIS — I2510 Atherosclerotic heart disease of native coronary artery without angina pectoris: Secondary | ICD-10-CM | POA: Diagnosis not present

## 2017-11-01 DIAGNOSIS — D509 Iron deficiency anemia, unspecified: Secondary | ICD-10-CM

## 2017-11-01 DIAGNOSIS — K922 Gastrointestinal hemorrhage, unspecified: Secondary | ICD-10-CM | POA: Diagnosis not present

## 2017-11-01 DIAGNOSIS — L89159 Pressure ulcer of sacral region, unspecified stage: Secondary | ICD-10-CM | POA: Diagnosis not present

## 2017-11-01 DIAGNOSIS — T83518A Infection and inflammatory reaction due to other urinary catheter, initial encounter: Secondary | ICD-10-CM | POA: Diagnosis not present

## 2017-11-01 DIAGNOSIS — E872 Acidosis: Secondary | ICD-10-CM | POA: Diagnosis not present

## 2017-11-01 DIAGNOSIS — E1122 Type 2 diabetes mellitus with diabetic chronic kidney disease: Secondary | ICD-10-CM | POA: Diagnosis not present

## 2017-11-01 DIAGNOSIS — Z794 Long term (current) use of insulin: Secondary | ICD-10-CM | POA: Diagnosis not present

## 2017-11-01 DIAGNOSIS — E785 Hyperlipidemia, unspecified: Secondary | ICD-10-CM | POA: Diagnosis not present

## 2017-11-01 DIAGNOSIS — I5032 Chronic diastolic (congestive) heart failure: Secondary | ICD-10-CM | POA: Diagnosis not present

## 2017-11-01 DIAGNOSIS — E1140 Type 2 diabetes mellitus with diabetic neuropathy, unspecified: Secondary | ICD-10-CM | POA: Diagnosis not present

## 2017-11-01 DIAGNOSIS — S31000D Unspecified open wound of lower back and pelvis without penetration into retroperitoneum, subsequent encounter: Secondary | ICD-10-CM | POA: Diagnosis not present

## 2017-11-01 DIAGNOSIS — R079 Chest pain, unspecified: Secondary | ICD-10-CM | POA: Diagnosis not present

## 2017-11-01 DIAGNOSIS — J90 Pleural effusion, not elsewhere classified: Secondary | ICD-10-CM | POA: Diagnosis not present

## 2017-11-01 DIAGNOSIS — R2689 Other abnormalities of gait and mobility: Secondary | ICD-10-CM | POA: Diagnosis not present

## 2017-11-01 DIAGNOSIS — Y732 Prosthetic and other implants, materials and accessory gastroenterology and urology devices associated with adverse incidents: Secondary | ICD-10-CM | POA: Diagnosis not present

## 2017-11-01 DIAGNOSIS — A4189 Other specified sepsis: Secondary | ICD-10-CM | POA: Diagnosis not present

## 2017-11-01 DIAGNOSIS — M19049 Primary osteoarthritis, unspecified hand: Secondary | ICD-10-CM | POA: Diagnosis not present

## 2017-11-01 DIAGNOSIS — A419 Sepsis, unspecified organism: Secondary | ICD-10-CM | POA: Diagnosis not present

## 2017-11-01 DIAGNOSIS — M86161 Other acute osteomyelitis, right tibia and fibula: Secondary | ICD-10-CM | POA: Diagnosis not present

## 2017-11-01 DIAGNOSIS — I480 Paroxysmal atrial fibrillation: Secondary | ICD-10-CM | POA: Diagnosis not present

## 2017-11-01 DIAGNOSIS — G894 Chronic pain syndrome: Secondary | ICD-10-CM | POA: Diagnosis not present

## 2017-11-01 DIAGNOSIS — E119 Type 2 diabetes mellitus without complications: Secondary | ICD-10-CM | POA: Diagnosis not present

## 2017-11-01 DIAGNOSIS — Z9981 Dependence on supplemental oxygen: Secondary | ICD-10-CM | POA: Diagnosis not present

## 2017-11-01 DIAGNOSIS — D72829 Elevated white blood cell count, unspecified: Secondary | ICD-10-CM | POA: Diagnosis not present

## 2017-11-01 DIAGNOSIS — J9621 Acute and chronic respiratory failure with hypoxia: Secondary | ICD-10-CM | POA: Diagnosis not present

## 2017-11-01 DIAGNOSIS — G9340 Encephalopathy, unspecified: Secondary | ICD-10-CM | POA: Diagnosis not present

## 2017-11-01 DIAGNOSIS — L8994 Pressure ulcer of unspecified site, stage 4: Secondary | ICD-10-CM | POA: Diagnosis not present

## 2017-11-01 DIAGNOSIS — R5381 Other malaise: Secondary | ICD-10-CM | POA: Diagnosis not present

## 2017-11-01 DIAGNOSIS — R6521 Severe sepsis with septic shock: Secondary | ICD-10-CM | POA: Diagnosis not present

## 2017-11-01 DIAGNOSIS — I96 Gangrene, not elsewhere classified: Secondary | ICD-10-CM | POA: Diagnosis not present

## 2017-11-01 DIAGNOSIS — G629 Polyneuropathy, unspecified: Secondary | ICD-10-CM | POA: Diagnosis not present

## 2017-11-01 DIAGNOSIS — M545 Low back pain: Secondary | ICD-10-CM | POA: Diagnosis not present

## 2017-11-01 DIAGNOSIS — K9429 Other complications of gastrostomy: Secondary | ICD-10-CM | POA: Diagnosis not present

## 2017-11-01 DIAGNOSIS — Z66 Do not resuscitate: Secondary | ICD-10-CM | POA: Diagnosis not present

## 2017-11-01 DIAGNOSIS — Z89611 Acquired absence of right leg above knee: Secondary | ICD-10-CM | POA: Diagnosis not present

## 2017-11-01 DIAGNOSIS — N183 Chronic kidney disease, stage 3 (moderate): Secondary | ICD-10-CM | POA: Diagnosis not present

## 2017-11-01 DIAGNOSIS — E43 Unspecified severe protein-calorie malnutrition: Secondary | ICD-10-CM | POA: Diagnosis not present

## 2017-11-01 DIAGNOSIS — M4690 Unspecified inflammatory spondylopathy, site unspecified: Secondary | ICD-10-CM | POA: Diagnosis not present

## 2017-11-01 DIAGNOSIS — Z6841 Body Mass Index (BMI) 40.0 and over, adult: Secondary | ICD-10-CM | POA: Diagnosis not present

## 2017-11-01 DIAGNOSIS — N170 Acute kidney failure with tubular necrosis: Secondary | ICD-10-CM | POA: Diagnosis not present

## 2017-11-01 DIAGNOSIS — K632 Fistula of intestine: Secondary | ICD-10-CM | POA: Diagnosis not present

## 2017-11-01 DIAGNOSIS — R6520 Severe sepsis without septic shock: Secondary | ICD-10-CM | POA: Diagnosis not present

## 2017-11-01 DIAGNOSIS — G8918 Other acute postprocedural pain: Secondary | ICD-10-CM | POA: Diagnosis not present

## 2017-11-01 DIAGNOSIS — E1165 Type 2 diabetes mellitus with hyperglycemia: Secondary | ICD-10-CM | POA: Diagnosis not present

## 2017-11-01 DIAGNOSIS — S31802A Laceration with foreign body of unspecified buttock, initial encounter: Secondary | ICD-10-CM | POA: Diagnosis not present

## 2017-11-01 DIAGNOSIS — J95822 Acute and chronic postprocedural respiratory failure: Secondary | ICD-10-CM | POA: Diagnosis not present

## 2017-11-01 DIAGNOSIS — I1310 Hypertensive heart and chronic kidney disease without heart failure, with stage 1 through stage 4 chronic kidney disease, or unspecified chronic kidney disease: Secondary | ICD-10-CM | POA: Diagnosis not present

## 2017-11-01 DIAGNOSIS — G9341 Metabolic encephalopathy: Secondary | ICD-10-CM | POA: Diagnosis not present

## 2017-11-01 DIAGNOSIS — I509 Heart failure, unspecified: Secondary | ICD-10-CM | POA: Diagnosis not present

## 2017-11-01 DIAGNOSIS — M6281 Muscle weakness (generalized): Secondary | ICD-10-CM | POA: Diagnosis not present

## 2017-11-01 DIAGNOSIS — R509 Fever, unspecified: Secondary | ICD-10-CM | POA: Diagnosis not present

## 2017-11-01 DIAGNOSIS — Z515 Encounter for palliative care: Secondary | ICD-10-CM | POA: Diagnosis not present

## 2017-11-01 DIAGNOSIS — G8928 Other chronic postprocedural pain: Secondary | ICD-10-CM | POA: Diagnosis not present

## 2017-11-01 DIAGNOSIS — L89154 Pressure ulcer of sacral region, stage 4: Secondary | ICD-10-CM | POA: Diagnosis not present

## 2017-11-01 DIAGNOSIS — Y846 Urinary catheterization as the cause of abnormal reaction of the patient, or of later complication, without mention of misadventure at the time of the procedure: Secondary | ICD-10-CM | POA: Diagnosis not present

## 2017-11-01 DIAGNOSIS — N179 Acute kidney failure, unspecified: Secondary | ICD-10-CM | POA: Diagnosis not present

## 2017-11-01 DIAGNOSIS — I4891 Unspecified atrial fibrillation: Secondary | ICD-10-CM | POA: Diagnosis not present

## 2017-11-01 LAB — URINALYSIS, ROUTINE W REFLEX MICROSCOPIC
BILIRUBIN URINE: NEGATIVE
Glucose, UA: NEGATIVE mg/dL
Ketones, ur: NEGATIVE mg/dL
Leukocytes, UA: NEGATIVE
NITRITE: NEGATIVE
PROTEIN: NEGATIVE mg/dL
SPECIFIC GRAVITY, URINE: 1.006 (ref 1.005–1.030)
pH: 8 (ref 5.0–8.0)

## 2017-11-01 LAB — LACTIC ACID, PLASMA: LACTIC ACID, VENOUS: 2 mmol/L — AB (ref 0.5–1.9)

## 2017-11-01 LAB — BASIC METABOLIC PANEL
Anion gap: 11 (ref 5–15)
BUN: 13 mg/dL (ref 6–20)
CALCIUM: 7.1 mg/dL — AB (ref 8.9–10.3)
CHLORIDE: 97 mmol/L — AB (ref 101–111)
CO2: 29 mmol/L (ref 22–32)
CREATININE: 0.84 mg/dL (ref 0.61–1.24)
GFR calc non Af Amer: >60 mL/min (ref >60)
GLUCOSE: 73 mg/dL (ref 65–99)
Potassium: 3 mmol/L — ABNORMAL LOW (ref 3.5–5.1)
Sodium: 137 mmol/L (ref 135–145)

## 2017-11-01 LAB — CBC
HEMATOCRIT: 25.5 % — AB (ref 39.0–52.0)
HEMOGLOBIN: 7.8 g/dL — AB (ref 13.0–17.0)
MCH: 23.8 pg — AB (ref 26.0–34.0)
MCHC: 30.6 g/dL (ref 30.0–36.0)
MCV: 77.7 fL — AB (ref 78.0–100.0)
Platelets: 472 10*3/uL — ABNORMAL HIGH (ref 150–400)
RBC: 3.28 MIL/uL — ABNORMAL LOW (ref 4.22–5.81)
RDW: 21.3 % — AB (ref 11.5–15.5)
WBC: 19.6 10*3/uL — ABNORMAL HIGH (ref 4.0–10.5)

## 2017-11-01 LAB — GLUCOSE, CAPILLARY
GLUCOSE-CAPILLARY: 73 mg/dL (ref 65–99)
Glucose-Capillary: 249 mg/dL — ABNORMAL HIGH (ref 65–99)
Glucose-Capillary: 171 mg/dL — ABNORMAL HIGH (ref 65–99)
Glucose-Capillary: 148 mg/dL — ABNORMAL HIGH (ref 65–99)

## 2017-11-01 LAB — OCCULT BLOOD X 1 CARD TO LAB, STOOL: Fecal Occult Bld: NEGATIVE

## 2017-11-01 MED ORDER — SODIUM CHLORIDE 0.9 % IV SOLN
INTRAVENOUS | Status: DC
  Administered 2017-11-01: 15:00:00 via INTRAVENOUS

## 2017-11-01 MED ORDER — HYDROMORPHONE 1 MG/ML IV SOLN
INTRAVENOUS | Status: DC
  Administered 2017-11-01: 16:00:00 via INTRAVENOUS
  Administered 2017-11-01: 4.5 mg via INTRAVENOUS
  Filled 2017-11-01: qty 25

## 2017-11-01 MED ORDER — VANCOMYCIN HCL 10 G IV SOLR: 1250.0000 mg | Freq: Two times a day (BID) | INTRAVENOUS | Status: AC

## 2017-11-01 MED ORDER — PIPERACILLIN-TAZOBACTAM 3.375 G IVPB
3.3750 g | Freq: Three times a day (TID) | INTRAVENOUS | Status: DC
Start: 2017-11-01 — End: 2017-11-02
  Administered 2017-11-01: 3.375 g via INTRAVENOUS
  Filled 2017-11-01: qty 50

## 2017-11-01 MED ORDER — VANCOMYCIN HCL 10 G IV SOLR
2500.0000 mg | INTRAVENOUS | Status: AC
  Administered 2017-11-01: 2500 mg via INTRAVENOUS
  Filled 2017-11-01: qty 2000

## 2017-11-01 MED ORDER — POTASSIUM CHLORIDE CRYS ER 20 MEQ PO TBCR
40.0000 meq | EXTENDED_RELEASE_TABLET | ORAL | Status: AC
  Administered 2017-11-01 (×3): 40 meq via ORAL
  Filled 2017-11-01 (×3): qty 2

## 2017-11-01 MED ORDER — PIPERACILLIN-TAZOBACTAM 3.375 G IVPB: 3.3750 g | Freq: Three times a day (TID) | INTRAVENOUS | Status: AC

## 2017-11-01 MED ORDER — VANCOMYCIN HCL 10 G IV SOLR
1250.0000 mg | Freq: Two times a day (BID) | INTRAVENOUS | Status: DC
  Filled 2017-11-01: qty 1250

## 2017-11-01 NOTE — Discharge Summary (Addendum)
Physician Discharge Summary  [name] JSH:[id number] DOB: 05-19-1951 DOA: 10/20/2017  PCP: [name], MD (Inactive)  Admit date: 10/20/2017 Discharge date: 11/01/2017  Time spent: 40 minutes  Recommendations for Follow-up:  1. Continue Vancomycin and Zosyn for presumed sepsis 2. Follow blood culture, urine culture, CXR results 3. Continue Hydrotherapy/wound care 4. Consider ID consultation  As per palliative care:  he reports pain has been manageable and requests not to change current regimen on PCA, will continue current regimen of 0.5mg  bolus with a 15 minute lockout.  Orders updated to reflect this.  Continue same on discharge if he able to continue this at [facility].  If not able to do PCA, recommend starting Oxycontin 20 mg po q 12 hr, along with oxycodone 10 mg po q 4 hr prn.   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)   Atrial fibrillation with rapid ventricular response (HCC)   Acute encephalopathy   Protein-calorie malnutrition, severe (HCC)   Sepsis (HCC)   Occult blood in stools   Discharge Condition: Stable  Diet recommendation: Carb modified diet  Filed Weights   10/27/17 0631 10/28/17 0533 10/29/17 0500  Weight: 122.8 kg (270 lb 11.6 oz) 122.7 kg (270 lb 8.1 oz) 125.8 kg (277 lb 5.4 oz)    History of present illness:  67 y.o.malewith medical history significant foratrial fibrillation on Xarelto, coronary artery disease, insulin-dependent diabetes mellitus, chronic diastolic CHF, chronic low back pain, and microcytic anemia, now presenting to the emergency department from his SNF for evaluation of fevers and decreased responsiveness. Patient was discharged from the hospital 1 week ago after management  of septic shock and ventilator dependent respiratory failure, suspected secondary to aspiration pneumonia and complicated by C. difficile infection. He had reportedly been recovering well at his SNF before he was noted to be listless and febrile on 4/11. In the emergency department, he was found to be in atrial fibrillation with RVR, and concern for severe sepsis with lactic acid 4.1. He was started on empiric vancomycin, merrem and admitted to stepdown unit. With improvement in HR, he was transitioned off cardizem gtt and transferred to telemetry unit.     Hospital Course:  1. 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  No clear abscess was noted per urology.  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  Today WBC is 19,000, he developed fever of 103 today we will check UA, urine culture, blood culture x2,, empirically start on vancomycin and Zosyn.  CT abdomen/pelvis done yesterday, showed no significant abscess.  2. UTI-urine culture grew greater than 100,000 colonies of Acinetobacter calcoaceticus/baumannii, multidrug resistant.  ID was consulted patient started on IV Unasyn.  Unasyn was  discontinued as it was supposed to given for 3 days only.    3. 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  Continue local wound care. Discussed with Dr Luciana Axe, no further antibiotics needed, as the debridement did not find pyocele in the scrotum. Continue wound care with hydrotherapy.  4. Atrial fibrillation with  RVR-chads vas score is at least 4.  Xarelto was held on this admission for procedure.  Restarted Xarelto, metoprolol 12.5 mg p.o. twice daily  5. Chronic microcytic anemia-baseline  hemoglobin is around 8.  Positive FOBT on admission, but repeat FOBT done today is negative   6. Chronic diastolic CHF-euvolemic.  Stable  7. Diabetes mellitus-hemoglobin A1c 6.1, continue Levemir sliding scale insulin NovoLog.  8. CAD-continue Lipitor, aspirin was held, aspirin has been restarted  9. Hypokalemia- potassium is 3.0 patient refused IV potassium yesterday.  Will give him K. Dur 40 mg p.o. x3 and check BMP in a.m.,   Discussed with Lour[facility],  [facility] Liaison. They can provide care to the patient at [facility]. Will discharge to LTAC today  Procedures:  Hydrotherapy        Debridement of skin and subcu tissue of the scrotum with excision of fistulous tract  Consultations:  General surgery   ID  Urology  Palliative care      Discharge Exam: Vitals:   11/01/17 1442 11/01/17 1534  BP: 105/85   Pulse: (!) 132   Resp: (!) 24 16  Temp: (!) 103 F (39.4 C) 99.6 F (37.6 C)  SpO2:       Discharge Instructions   Discharge Instructions    Diet - low sodium heart healthy   Complete by:  As directed    Increase activity slowly   Complete by:  As directed      Allergies as of 11/01/2017   No Known Allergies     Medication List    TAKE these medications   acetaminophen 500 MG tablet Commonly known as:  TYLENOL Take 500 mg by mouth every 6 (six) hours as needed for mild pain. What changed:  Another medication with the same name was removed. Continue taking this medication, and follow the directions you see here.   albuterol (2.5 MG/3ML) 0.083% nebulizer solution Commonly known as:  PROVENTIL Take 3 mLs (2.5 mg total) by nebulization every 4 (four) hours as needed for wheezing or shortness of breath.   aluminum-magnesium hydroxide-simethicone 200-200-20 MG/5ML Susp Commonly known as:  MAALOX Take 30 mLs by mouth every 4 (four) hours as needed (heartburn).   ascorbic acid 250 MG tablet Commonly known as:  VITAMIN C Take 1 tablet (250 mg total) by  mouth 2 (two) times daily.   aspirin 81 MG EC tablet Take 1 tablet (81 mg total) by mouth daily.   atorvastatin 80 MG tablet Commonly known as:  LIPITOR Take 1 tablet (80 mg total) by mouth daily.   calcium carbonate 500 MG chewable tablet Commonly known as:  TUMS - dosed in mg elemental calcium Chew 2 tablets (400 mg of elemental calcium total) by mouth 2 (two) times daily as needed for indigestion or heartburn.   calcium-vitamin D 500-200 MG-UNIT tablet Commonly known as:  OSCAL WITH D Take 1 tablet by mouth daily.   collagenase ointment Commonly known as:  SANTYL Apply 1 application topically daily.   diphenhydrAMINE 25 MG tablet Commonly known as:  BENADRYL Take 25 mg by mouth every 4 (four) hours as needed for itching.   famotidine 20 MG tablet Commonly known as:  PEPCID Take 1 tablet (20 mg total) by mouth at bedtime.   feeding supplement (GLUCERNA SHAKE) Liqd Take 237 mLs by mouth 3 (three) times daily between meals.   feeding supplement (PRO-STAT SUGAR FREE 64) Liqd Take 30 mLs by mouth 3 (three) times daily with meals.   ferrous sulfate 325 (  65 FE) MG tablet Take 1 tablet (325 mg total) by mouth 2 (two) times daily with a meal.   furosemide 20 MG tablet Commonly known as:  LASIX Take 1 tablet (20 mg total) by mouth daily.   gabapentin 300 MG capsule Commonly known as:  NEURONTIN Take 2 capsules (600 mg total) by mouth 2 (two) times daily.   hydrocerin Crea Apply 1 application topically 3 (three) times daily. legs   insulin detemir 100 UNIT/ML injection Commonly known as:  LEVEMIR Inject 0.1 mLs (10 Units total) into the skin daily. What changed:    how much to take  when to take this  additional instructions   lidocaine 4 % cream Commonly known as:  LMX Apply topically daily as needed (during hydro).   loperamide 2 MG capsule Commonly known as:  IMODIUM Take 1 capsule (2 mg total) by mouth every 6 (six) hours as needed for diarrhea or loose  stools.   magnesium oxide 400 (241.3 Mg) MG tablet Commonly known as:  MAG-OX Take 1 tablet (400 mg total) by mouth 2 (two) times daily.   metoprolol tartrate 25 MG tablet Commonly known as:  LOPRESSOR Take 1 tablet (25 mg total) by mouth 2 (two) times daily.   multivitamin with minerals Tabs tablet Take 1 tablet by mouth daily.   NOVOLOG FLEXPEN 100 UNIT/ML FlexPen Generic drug:  insulin aspart Inject 2-10 Units into the skin 4 (four) times daily -  with meals and at bedtime. Per sliding scale.. 150-200=2 units 201-250=4 units 251-300=6 units 301-350=8 units 351-400=10 units Over 400 call MD   ondansetron 4 MG tablet Commonly known as:  ZOFRAN Take 4 mg by mouth every 8 (eight) hours as needed for nausea or vomiting.   Oxycodone HCl 20 MG Tabs Take 1 tablet (20 mg total) by mouth every 4 (four) hours as needed (pain).   OXYGEN Inhale 2-3 L into the lungs continuous.   piperacillin-tazobactam 3.375 GM/50ML IVPB Commonly known as:  ZOSYN Inject 50 mLs (3.375 g total) into the vein every 8 (eight) hours.   potassium & sodium phosphates 280-160-250 MG Pack Commonly known as:  PHOS-NAK Take 1 packet by mouth 4 (four) times daily -  with meals and at bedtime.   protein supplement shake Liqd Commonly known as:  PREMIER PROTEIN Take 325 mLs (11 oz total) by mouth daily.   vancomycin 1,250 mg in sodium chloride 0.9 % 250 mL Inject 1,250 mg into the vein every 12 (twelve) hours. Start taking on:  11/02/2017   XARELTO 20 MG Tabs tablet Generic drug:  rivaroxaban TAKE 1 TABLET BY MOUTH EVERY DAY WITH SUPPER      No Known Allergies    The results of significant diagnostics from this hospitalization (including imaging, microbiology, ancillary and laboratory) are listed below for reference.    Significant Diagnostic Studies: Ct Abdomen Pelvis W Contrast  Result Date: 10/31/2017 CLINICAL DATA:  Abdominal pain, fever. EXAM: CT ABDOMEN AND PELVIS WITH CONTRAST TECHNIQUE:  Multidetector CT imaging of the abdomen and pelvis was performed using the standard protocol following bolus administration of intravenous contrast. CONTRAST:  OMNIPAQUE IOHEXOL 300 MG/ML  SOLN COMPARISON:  CT scan of October 24, 2017. FINDINGS: Lower chest: Moderate bilateral pleural effusions are noted with adjacent subsegmental atelectasis. Coronary artery calcifications are noted. Hepatobiliary: No focal liver abnormality is seen. No gallstones, gallbladder wall thickening, or biliary dilatation. Pancreas: Unremarkable. No pancreatic ductal dilatation or surrounding inflammatory changes. Spleen: Normal in size without focal abnormality. Adrenals/Urinary  Tract: 3.6 cm left adrenal mass is noted. Further evaluation with MRI is recommended to rule out neoplasm. Right adrenal gland appears normal. No hydronephrosis or renal obstruction is noted. No renal or ureteral calculi are noted. Urinary bladder is decompressed secondary to Foley catheter. Stomach/Bowel: There is no evidence of bowel obstruction. Stool is noted throughout the colon. The appendix is not visualized. Vascular/Lymphatic: Aortic atherosclerosis. No enlarged abdominal or pelvic lymph nodes. Reproductive: Prostate is unremarkable. Other: Mild anasarca is noted.  No hernia is noted. Musculoskeletal: No acute or significant osseous findings. IMPRESSION: Moderate bilateral pleural effusions are noted with adjacent subsegmental atelectasis. Coronary artery calcifications are noted suggesting coronary artery disease. 3.6 cm left adrenal mass is noted. This may simply represent adrenal adenoma, but further evaluation with MRI with and without gadolinium is recommended to rule out neoplasm, on nonemergent basis and when patient can hold still and follow instructions. Stool is noted throughout the colon suggesting constipation. No abscess or abnormal fluid collection is noted. Aortic Atherosclerosis (ICD10-I70.0). Electronically Signed   By: Lupita Raider, M.D.   On: 10/31/2017 16:05   Ct Abdomen Pelvis W Contrast  Result Date: 10/24/2017 CLINICAL DATA:  67 year old male with large sacral decubitus ulcer with purulent drainage from the base of the scrotum. Poorly controlled diabetes. Subsequent encounter. EXAM: CT ABDOMEN AND PELVIS WITH CONTRAST TECHNIQUE: Multidetector CT imaging of the abdomen and pelvis was performed using the standard protocol following bolus administration of intravenous contrast. CONTRAST:  OMNIPAQUE IOHEXOL 300 MG/ML  SOLN COMPARISON:  10/11/2017 CT. FINDINGS: Lower chest: Bilateral pleural effusions with bibasilar consolidation suggestive of atelectasis with infiltrates a secondary consideration. Mild cardiomegaly.  Coronary artery calcifications. Hepatobiliary: No worrisome hepatic lesion. Fluid surrounds the gallbladder which may be related to third spacing of fluid, limiting evaluation for detection of cholecystitis. If this were of concern ultrasound may then be considered. Pancreas: No pancreatic mass or primary pancreatic inflammation. Spleen: No splenic mass.  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 Adrenals/Urinary Tract: 3.2 cm left adrenal lesion unchanged from recent CT but increased in size from 07/12/2015. It is possible this represents an adrenal adenoma however, correlation with elective adrenal MR recommended after patient's acute episodes has cleared. No obstructing stone.  No worrisome renal lesion. Partial contracted urinary bladder with circumferential wall thickening. Foley catheter in place. Stomach/Bowel: Large sacral decubitus ulcer extends adjacent to rectosigmoid region. There may be a fistula between the sacral decubitus and the rectum. From the rectosigmoid region, there may be a fistula to the scrotum contributing to large complex bilateral hydroceles/pyoceles Gas seen within the posterior superior aspect of left hydrocele/pyocele with fistula drainage to skin. Sigmoid and descending colon  diverticula. Given the degree of third spacing of fluid, limited for detecting primary bowel inflammatory process. No free intraperitoneal air. Vascular/Lymphatic: Atherosclerotic changes aorta without aneurysm. Scattered reactive lymph nodes. Reproductive: No worrisome abnormality. Other: No free intraperitoneal air. Musculoskeletal: Degenerative changes lumbar spine. Mild hip joint degenerative changes. IMPRESSION: Large sacral decubitus ulcer extends adjacent to rectosigmoid region. There may be a fistula between the sacral decubitus and the rectum. From the rectosigmoid region, there may be a fistula to the scrotum contributing to large complex bilateral hydroceles/pyoceles Gas seen within the posterior superior aspect of left hydrocele/pyocele with fistula drainage to skin. Third spacing of fluid limits evaluation for detection of cholecystitis or primary bowel inflammatory process. Bilateral pleural effusions with bibasilar consolidation suggestive of atelectasis with infiltrates a secondary consideration. Mild cardiomegaly. Coronary artery calcifications. Aortic Atherosclerosis (  ICD10-I70.0). 3.2 cm left adrenal lesion unchanged from recent CT but increased in size from 07/12/2015. It is possible this represents an adrenal adenoma however, correlation with elective adrenal MR recommended after patient's acute episodes has cleared. Partial contracted urinary bladder with circumferential wall thickening. Foley catheter in place. These results will be called to the ordering clinician or representative by the Radiologist Assistant, and communication documented in the PACS or zVision Dashboard. Electronically Signed   By: Lacy Duverney M.D.   On: 10/24/2017 19:39   Ct Abdomen Pelvis W Contrast  Result Date: 10/11/2017 CLINICAL DATA:  Abdominal pain. Currently admitted for septic shock, aspiration pneumonia, and C difficile colitis. EXAM: CT ABDOMEN AND PELVIS WITH CONTRAST TECHNIQUE: Multidetector CT imaging of  the abdomen and pelvis was performed using the standard protocol following bolus administration of intravenous contrast. CONTRAST:  ISOVUE-300 IOPAMIDOL (ISOVUE-300) INJECTION 61% COMPARISON:  CT abdomen pelvis dated September 15, 2015. FINDINGS: Lower chest: Moderate bilateral pleural effusions with consolidation/atelectasis in the left greater than right lower lobes. Hepatobiliary: No focal liver abnormality. Layering gallbladder sludge with mildly thickened or edematous wall. No biliary dilatation. Pancreas: Unremarkable. No pancreatic ductal dilatation or surrounding inflammatory changes. Spleen: Normal in size without focal abnormality. Adrenals/Urinary Tract: Unchanged 3.2 cm adenoma in the left adrenal gland. The right adrenal gland is unremarkable. No focal renal lesion. No renal or ureteral calculi. No hydronephrosis. The bladder is decompressed by Foley catheter. Stomach/Bowel: Large amount of stool in the distal sigmoid colon and rectum. Rectal tube in place. Mild gaseous distention of the sigmoid colon. No evidence of obstruction, bowel wall thickening, or surrounding inflammatory changes. The stomach is within normal limits. The appendix is surgically absent. Vascular/Lymphatic: Aortic atherosclerosis. Mildly enlarged portacaval lymph node, measuring up to 1.2 cm, previously 1.9 cm. Reproductive: Prostate is unremarkable. Other: Small ascites.  No pneumoperitoneum. Musculoskeletal: Diffuse anasarca. No acute or significant osseous findings. IMPRESSION: 1. Large amount of stool in the distal sigmoid colon and rectum with mild gaseous distention of the sigmoid colon. Correlate for rectal tube function. No definite evidence of gastroenteritis or colitis. 2. Gallbladder sludge with mild wall thickening. Small amount of pericholecystic fluid could be related to ascites. Correlate for acute cholecystitis and consider right upper quadrant ultrasound for further evaluation as clinically indicated. 3. Moderate  bilateral pleural effusions with consolidation/atelectasis in the left greater than right lower lobes. 4. Small ascites and diffuse anasarca. 5.  Aortic atherosclerosis (ICD10-I70.0). Electronically Signed   By: Obie Dredge M.D.   On: 10/11/2017 18:52   Dg Chest Port 1 View  Result Date: 10/20/2017 CLINICAL DATA:  Sepsis. EXAM: PORTABLE CHEST 1 VIEW COMPARISON:  10/02/2017 FINDINGS: The heart is mildly enlarged but stable. Stable tortuosity and calcification of the thoracic aorta. Mild central vascular congestion without overt pulmonary edema. No infiltrates or effusions. The bony thorax is intact. IMPRESSION: Cardiac enlargement and mild central vascular congestion without overt pulmonary edema. No definite infiltrates or effusions. Electronically Signed   By: Rudie Meyer M.D.   On: 10/20/2017 18:14   US Abdomen Limited Ruq  Result Date: 10/12/2017 CLINICAL DATA:  67 year old male with abnormal gallbladder on recent CT. Recent abdominal pain. EXAM: ULTRASOUND ABDOMEN LIMITED RIGHT UPPER QUADRANT COMPARISON:  CT Abdomen and Pelvis 14 19 end earlier. FINDINGS: Gallbladder: Gallbladder wall thickness is at the upper limits of normal, 3 millimeters. Possible sludge, but no echogenic stones are identified within the gallbladder lumen. No pericholecystic fluid identified. No sonographic Murphy sign elicited. Common bile duct: Diameter:  4 millimeters, normal Liver: Generalized increased liver echogenicity (image 42). No discrete liver lesion. No intrahepatic biliary ductal dilatation. Portal vein is patent on color Doppler imaging with normal direction of blood flow towards the liver. Other findings: Trace perihepatic free fluid (image 19). Partially visible right pleural effusion (image 20). IMPRESSION: 1. No cholelithiasis or evidence of acute cholecystitis. 2. No evidence of acute biliary obstruction. 3. Fatty liver disease with trace perihepatic free fluid. Partially visible right pleural effusion.  Electronically Signed   By: Odessa Fleming M.D.   On: 10/12/2017 15:20    Microbiology: No results found for this or any previous visit (from the past 240 hour(s)).   Labs: Basic Metabolic Panel: Recent Labs  Lab 10/26/17 0025 10/27/17 0521 10/28/17 0547 10/29/17 0529 10/30/17 0540 10/31/17 0609 11/01/17 0542  NA 144 145  --   --  141 138 137  K 3.9 3.8  --   --  3.5 3.1* 3.0*  CL 109 107  --   --  103 98* 97*  CO2 28 29  --   --  30 29 29   GLUCOSE 116* 149*  --   --  81 109* 73  BUN 8 8  --   --  11 12 13   CREATININE 0.68 0.68 0.68 0.68 0.77 0.77 0.84  CALCIUM 7.2* 7.3*  --   --  6.4* 7.1* 7.1*  MG 1.9  --   --   --   --   --   --    Liver Function Tests: Recent Labs  Lab 10/27/17 0521  AST 36  ALT 33  ALKPHOS 99  BILITOT 0.7  PROT 6.5  ALBUMIN 1.9*   No results for input(s): LIPASE, AMYLASE in the last 168 hours. No results for input(s): AMMONIA in the last 168 hours. CBC: Recent Labs  Lab 10/27/17 0521 10/28/17 0547 10/30/17 0540 10/31/17 0609 10/31/17 1810 11/01/17 0542  WBC 15.7* 18.5* 16.6* 16.0*  --  19.6*  HGB 8.7* 9.1* 6.8* 7.9* 8.2* 7.8*  HCT 30.6* 30.7* 22.3* 25.5* 26.5* 25.5*  MCV 80.1 79.3 77.7* 78.2  --  77.7*  PLT 596* 645* 527* 526*  --  472*   Cardiac Enzymes: No results for input(s): CKTOTAL, CKMB, CKMBINDEX, TROPONINI in the last 168 hours. BNP: BNP (last 3 results) Recent Labs    01/22/17 0509 03/14/17 0412 09/01/17 0237  BNP 183.2* 414.8* 1,401.6*    ProBNP (last 3 results) No results for input(s): PROBNP in the last 8760 hours.  CBG: Recent Labs  Lab 10/31/17 1159 10/31/17 1718 10/31/17 2101 11/01/17 0748 11/01/17 1203  GLUCAP 249* 203* 169* 73 171*       Signed:  Meredeth Ide MD.  [name] 11/01/2017, 3:57 PM

## 2017-11-01 NOTE — Progress Notes (Signed)
Patient dressing changed and cleaned up prior to D/C.

## 2017-11-01 NOTE — Progress Notes (Signed)
Wasted 55ml of PCA dilaudid from syringe with Abby Pinnix, RN. Replaced with new syringe.

## 2017-11-01 NOTE — Care Management Note (Signed)
Case Management Note  Patient Details  Name: [name] MRN: [id number] Date of Birth: 11/01/1950  Subjective/Objective:  Per [facility] rep [facility]-patient has been approved per insurance-[facility] has a bed. patient in agreement to going to [facility](he states I do not have to contact his dtr)Per Dr. [name] since patient is Septic currently-will start septic wourk up, hopefully can d/c tomorrow.                  Action/Plan:d/c [facility]   Expected Discharge Date:  (unknown)               Expected Discharge Plan:  Long Term Acute Care (LT[facility])  In-House Referral:  Clinical Social Work  Discharge planning Services  CM Consult  Post Acute Care Choice:    Choice offered to:     DME Arranged:    DME Agency:     HH Arranged:    HH Agency:     Status of Service:  In process, will continue to follow  If discussed at Long Length of Stay Meetings, dates discussed:    Additional Comments:  Lanier Clam, RN 11/01/2017, 3:17 PM

## 2017-11-01 NOTE — Progress Notes (Signed)
Pharmacy Antibiotic Note  [name] is a 67 y.o. male admitted on 10/20/2017 with sepsis.  Pharmacy was been consulted for vancomycin and meropenem dosing.  Patient with suspected skin/soft tissue infection. At end of March, had + c. Difficile,  UCx = VRE and acinetobacter (treated with amp/sulb).  Clostridium spp found in BCx last admission Noted to have ESBL E. coli bacteremia in Feb.  Vancomycin and Meropenem were discontinued on 4/15 and Unasyn was given 4/15-4/18.  No antibiotics since 4/18.  Today, pharmacy consulted to resume dosing of Vancomycin along with Zosyn for sepsis.  Patient with noted temp, elevated HR, and elevated WBC.  Blood and urine cultures sent off today.  Plan:  Vancomycin 2500mg  IV x 1 loading dose followed by 1250mg  IV q12 - goal AUC 400-500  Zosyn 3.375gm IV q8h (each dose infused over 4 hrs)  Monitor renal fx (daily SCr has been ordered)  Await culture results  Height: 6' (182.9 cm) Weight: 277 lb 5.4 oz (125.8 kg) IBW/kg (Calculated) : 77.6  Temp (24hrs), Avg:100 F (37.8 C), Min:98.8 F (37.1 C), Max:103 F (39.4 C)  Recent Labs  Lab 10/27/17 0521 10/28/17 0547 10/29/17 0529 10/30/17 0540 10/31/17 0609 11/01/17 0542  WBC 15.7* 18.5*  --  16.6* 16.0* 19.6*  CREATININE 0.68 0.68 0.68 0.77 0.77 0.84    Estimated Creatinine Clearance: 118.6 mL/min (by C-G formula based on SCr of 0.84 mg/dL).    No Known Allergies  Antimicrobials this admission: 4/11 vancomycin >> 4/15; resumed 4/23 >> 4/11 meropenem >>4/15 4/15 unasyn >> 4/18 4/23 zosyn >>  Dose adjustments this admission:  Microbiology results: 3/21 UCx: 70K VRE (amp susc), acinetobacter (S to amp/sulb, cefepime, gent) 3/21 BCx: 1/2 GVR = clostridium spp.  3/22 C. Diff quick scan: +/-  3/22 C. Diff PCR: Positive  4/11 BCx: NG 4/11 UCx:  Acinetobacter 4/11 MRSA PCR: positive  4/23 BCx: sent 4/23 UCx: sent  Thank you for allowing pharmacy to be a part of this patient's  care.  5/23, PharmD 11/01/2017 3:30 PM

## 2017-11-01 NOTE — Care Management Note (Signed)
Case Management Note  Patient Details  Name: [name] MRN: [id number] Date of Birth: 1951-05-15  Subjective/Objective: Noted patient continues to be inconsistent with allowing staff to care for his wound.Not appropriate for LTAC.PT-hydrotherapy, iv abx.CSW following for SNF.                   Action/Plan:d/c plan SNF.   Expected Discharge Date:  (unknown)               Expected Discharge Plan:     In-House Referral:     Discharge planning Services     Post Acute Care Choice:    Choice offered to:     DME Arranged:    DME Agency:     HH Arranged:    HH Agency:     Status of Service:     If discussed at [employer], dates discussed:    Additional Comments:  Lanier Clam, RN 11/01/2017, 2:25 PM

## 2017-11-01 NOTE — Progress Notes (Signed)
Palliative care progress note  [facility] to [facility] to [facility] today.    I discussed with representative from [facility] and while PCA not available, they are able to do continuous infusion with PRN dosing.  With 24 hour usage of dilaudid of 14.5 mg, I would recommend starting continuous infusion at 0.3mg  per hour (7.2mg  per 24 hours or roughly half of last 24 hour usage) for basal rate with dilaudid 1mg  every hour (roughly 10% of his 24 hour needs) as needed for breakthrough pain.  Would also recommend additional dilaudid 1mg  30 minutes prior to hydrotherapy.  [facility] has pain management specialist available who can then further titrate based on his usage overnight.  , MD [facility] Palliative Medicine Team [phone]

## 2017-11-01 NOTE — Progress Notes (Signed)
[facility]  PROGRESS NOTE  [name]:[id number] DOB: Nov 17, 1950 DOA: 10/20/2017 PCP: [name], MD (Inactive)   Brief HPI:   67 y.o.malewith medical history significant foratrial fibrillation on Xarelto, coronary artery disease, insulin-dependent diabetes mellitus, chronic diastolic CHF, chronic low back pain, and microcytic anemia, now presenting to the emergency department from his SNF for evaluation of fevers and decreased responsiveness. Patient was discharged from the hospital 1 week ago after management of septic shock and ventilator dependent respiratory failure, suspected secondary to aspiration pneumonia and complicated by C. difficile infection. He had reportedly been recovering well at his SNF before he was noted to be listless and febrile on 4/11.  In the emergency department, he was found to be in atrial fibrillation with RVR, and concern for severe sepsis with lactic acid 4.1.  He was started on empiric vancomycin, merrem and admitted to stepdown unit. With improvement in HR, he was transitioned off cardizem gtt and transferred to telemetry unit.    Subjective  Mild Patient seen and examined, FOBT obtained today is negative.  He developed fever of 103, sinus tachycardia, tachypnea.   Assessment/Plan:     1.  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  No clear abscess was noted per urology.  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  Today WBC is 19,000, he developed fever of 103 today we will check UA, urine culture, blood culture x2,, empirically start on vancomycin and Zosyn.  IV normal saline at 100 mL/h.  2. UTI-urine culture grew greater than 100,000 colonies of Acinetobacter calcoaceticus/baumannii, multidrug resistant.  ID  was consulted patient started on IV Unasyn.  Unasyn was  discontinued as it was supposed to given for 3 days only.    3. 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  Continue local wound care. Discussed with Dr Luciana Axe, no further antibiotics needed, as the debridement did not find pyocele in the scrotum. Continue wound care with hydrotherapy.  4. Atrial fibrillation with RVR-chads vas score is at least 4.  Xarelto was held on this admission for procedure.  Restarted Xarelto, metoprolol 12.5 mg p.o. twice daily  5. Chronic microcytic anemia-baseline hemoglobin is around 8.  Positive FOBT on admission, but repeat FOBT done today is negative   6. Chronic diastolic CHF-euvolemic.  Stable  7. Diabetes mellitus-hemoglobin A1c 6.1, continue Levemir sliding scale insulin NovoLog.  8. CAD-continue Lipitor, aspirin was held, aspirin has been restarted  9. Hypokalemia- potassium is 3.0 patient refused IV potassium yesterday.  Will give him K. Dur 40 mg p.o. x3 and check BMP in a.m.,     DVT prophylaxis: xarelto  Code Status: DNR  Family Communication: No family at bedside  Disposition Plan: likely skilled facility in next 24 to 48 hours   Consultants:  General surgery   ID  Urology  Palliative care    Procedures: None   Antibiotics:   Anti-infectives (From admission, onward)   Start     Dose/Rate Route Frequency Ordered Stop   10/24/17 1200  Ampicillin-Sulbactam (UNASYN) 3 g in sodium chloride 0.9 % 100 mL IVPB     3 g 200 mL/hr over 30 Minutes Intravenous Every 6 hours 10/24/17 1030 10/27/17 1217   10/22/17 1000  vancomycin (VANCOCIN) 1,250  mg in sodium chloride 0.9 % 250 mL IVPB  Status:  Discontinued     1,250 mg 166.7 mL/hr over 90 Minutes Intravenous Every 12 hours 10/22/17 0958 10/24/17 1030   10/20/17 2200  vancomycin (VANCOCIN) IVPB 1000 mg/200 mL premix  Status:   Discontinued     1,000 mg 200 mL/hr over 60 Minutes Intravenous Every 12 hours 10/20/17 2043 10/22/17 0958   10/20/17 2200  meropenem (MERREM) 1 g in sodium chloride 0.9 % 100 mL IVPB  Status:  Discontinued     1 g 200 mL/hr over 30 Minutes Intravenous Every 8 hours 10/20/17 2043 10/24/17 1030   10/20/17 2000  vancomycin (VANCOCIN) IVPB 1000 mg/200 mL premix  Status:  Discontinued     1,000 mg 200 mL/hr over 60 Minutes Intravenous  Once 10/20/17 1956 10/20/17 2051   10/20/17 1645  piperacillin-tazobactam (ZOSYN) IVPB 3.375 g     3.375 g 100 mL/hr over 30 Minutes Intravenous  Once 10/20/17 1636 10/20/17 1830   10/20/17 1645  vancomycin (VANCOCIN) IVPB 1000 mg/200 mL premix     1,000 mg 200 mL/hr over 60 Minutes Intravenous  Once 10/20/17 1636 10/20/17 1924       Objective   Vitals:   11/01/17 1008 11/01/17 1145 11/01/17 1222 11/01/17 1442  BP: (!) 109/59   105/85  Pulse: (!) 103   (!) 132  Resp: 17 19 18  (!) 24  Temp: 99.3 F (37.4 C)   (!) 103 F (39.4 C)  TempSrc: Oral   Axillary  SpO2: 98%  98%   Weight:      Height:        Intake/Output Summary (Last 24 hours) at 11/01/2017 1512 Last data filed at 11/01/2017 1030 Gross per 24 hour  Intake 480 ml  Output 5575 ml  Net -5095 ml   Filed Weights   10/27/17 0631 10/28/17 0533 10/29/17 0500  Weight: 122.8 kg (270 lb 11.6 oz) 122.7 kg (270 lb 8.1 oz) 125.8 kg (277 lb 5.4 oz)     Physical Examination: Today WBC is 19,000 Mouth: Oral mucosa is moist, no lesions on palate,  Neck: Supple, no deformities, masses, or tenderness Lungs: Normal respiratory effort, bilateral clear to auscultation, no crackles or wheezes.  Heart: Regular rate and rhythm, S1 and S2 normal, no murmurs, rubs auscultated Abdomen: BS normoactive,soft,nondistended,non-tender to palpation,no organomegaly Extremities: No pretibial edema, no erythema, no cyanosis, no clubbing Neuro : Alert and oriented to time, place and person, No focal  deficits        Data Reviewed: I have personally reviewed following labs and imaging studies  CBG: Recent Labs  Lab 10/31/17 1159 10/31/17 1718 10/31/17 2101 11/01/17 0748 11/01/17 1203  GLUCAP 249* 203* 169* 73 171*    CBC: Recent Labs  Lab 10/27/17 0521 10/28/17 0547 10/30/17 0540 10/31/17 0609 10/31/17 1810 11/01/17 0542  WBC 15.7* 18.5* 16.6* 16.0*  --  19.6*  HGB 8.7* 9.1* 6.8* 7.9* 8.2* 7.8*  HCT 30.6* 30.7* 22.3* 25.5* 26.5* 25.5*  MCV 80.1 79.3 77.7* 78.2  --  77.7*  PLT 596* 645* 527* 526*  --  472*    Basic Metabolic Panel: Recent Labs  Lab 10/26/17 0025 10/27/17 0521 10/28/17 0547 10/29/17 0529 10/30/17 0540 10/31/17 0609 11/01/17 0542  NA 144 145  --   --  141 138 137  K 3.9 3.8  --   --  3.5 3.1* 3.0*  CL 109 107  --   --  103 98* 97*  CO2 28  29  --   --  30 29 29   GLUCOSE 116* 149*  --   --  81 109* 73  BUN 8 8  --   --  11 12 13   CREATININE 0.68 0.68 0.68 0.68 0.77 0.77 0.84  CALCIUM 7.2* 7.3*  --   --  6.4* 7.1* 7.1*  MG 1.9  --   --   --   --   --   --     No results found for this or any previous visit (from the past 240 hour(s)).   Liver Function Tests: Recent Labs  Lab 10/27/17 0521  AST 36  ALT 33  ALKPHOS 99  BILITOT 0.7  PROT 6.5  ALBUMIN 1.9*   No results for input(s): LIPASE, AMYLASE in the last 168 hours. No results for input(s): AMMONIA in the last 168 hours.  Cardiac Enzymes: No results for input(s): CKTOTAL, CKMB, CKMBINDEX, TROPONINI in the last 168 hours. BNP (last 3 results) Recent Labs    01/22/17 0509 03/14/17 0412 09/01/17 0237  BNP 183.2* 414.8* 1,401.6*    ProBNP (last 3 results) No results for input(s): PROBNP in the last 8760 hours.    Studies: Ct Abdomen Pelvis W Contrast  Result Date: 10/31/2017 CLINICAL DATA:  Abdominal pain, fever. EXAM: CT ABDOMEN AND PELVIS WITH CONTRAST TECHNIQUE: Multidetector CT imaging of the abdomen and pelvis was performed using the standard protocol  following bolus administration of intravenous contrast. CONTRAST:  OMNIPAQUE IOHEXOL 300 MG/ML  SOLN COMPARISON:  CT scan of October 24, 2017. FINDINGS: Lower chest: Moderate bilateral pleural effusions are noted with adjacent subsegmental atelectasis. Coronary artery calcifications are noted. Hepatobiliary: No focal liver abnormality is seen. No gallstones, gallbladder wall thickening, or biliary dilatation. Pancreas: Unremarkable. No pancreatic ductal dilatation or surrounding inflammatory changes. Spleen: Normal in size without focal abnormality. Adrenals/Urinary Tract: 3.6 cm left adrenal mass is noted. Further evaluation with MRI is recommended to rule out neoplasm. Right adrenal gland appears normal. No hydronephrosis or renal obstruction is noted. No renal or ureteral calculi are noted. Urinary bladder is decompressed secondary to Foley catheter. Stomach/Bowel: There is no evidence of bowel obstruction. Stool is noted throughout the colon. The appendix is not visualized. Vascular/Lymphatic: Aortic atherosclerosis. No enlarged abdominal or pelvic lymph nodes. Reproductive: Prostate is unremarkable. Other: Mild anasarca is noted.  No hernia is noted. Musculoskeletal: No acute or significant osseous findings. IMPRESSION: Moderate bilateral pleural effusions are noted with adjacent subsegmental atelectasis. Coronary artery calcifications are noted suggesting coronary artery disease. 3.6 cm left adrenal mass is noted. This may simply represent adrenal adenoma, but further evaluation with MRI with and without gadolinium is recommended to rule out neoplasm, on nonemergent basis and when patient can hold still and follow instructions. Stool is noted throughout the colon suggesting constipation. No abscess or abnormal fluid collection is noted. Aortic Atherosclerosis (ICD10-I70.0). Electronically Signed   By: Lupita Raider, M.D.   On: 10/31/2017 16:05    Scheduled Meds: . aspirin EC  81 mg Oral Daily  .  atorvastatin  80 mg Oral q1800  . calcium-vitamin D  1 tablet Oral Daily  . furosemide  20 mg Intravenous Q12H  . gabapentin  600 mg Oral BID  . guaiFENesin  600 mg Oral BID  . HYDROmorphone   Intravenous Q4H  . insulin aspart  0-9 Units Subcutaneous TID WC  . insulin aspart  3 Units Subcutaneous TID WC  . insulin detemir  10 Units Subcutaneous BID  . iopamidol  30 mL Oral Once  . metoprolol tartrate  12.5 mg Oral BID  . multivitamin with minerals  1 tablet Oral Daily  . nutrition supplement (JUVEN)  1 packet Oral BID BM  . pantoprazole  40 mg Oral Daily  . potassium chloride  40 mEq Oral Q4H  . rivaroxaban  20 mg Oral Q supper  . sodium chloride flush  3 mL Intravenous Q12H      Time spent: 25 min  Meredeth Ide   [name] Pager [phone]. If 7PM-7AM, please contact night-coverage at [email or web address], Office  [phone]  password [email or web address]  11/01/2017, 3:12 PM  LOS: 12 days

## 2017-11-01 NOTE — Progress Notes (Signed)
Asked patient if this RN and PCA can clean patient up, do foley care and change his dressing. Patient declined.

## 2017-11-01 NOTE — Progress Notes (Signed)
CSW informed by patient's RNCM that patient has been accepted and will dc to [facility]. CSW signing off, no other needs identified at this time.  Celso Sickle, [place] Clinical Social Worker [facility] Cell#: [phone]

## 2017-11-01 NOTE — Progress Notes (Addendum)
11/01/17 1100 Hydrotherapy  Treatment note [id number]  Subjective Assessment  Subjective RN had to encourage patient to accept hydro, given meds,  Patient and Family Stated Goals no pain  Prior Treatments dressing change  Evaluation and Treatment  Evaluation and Treatment Procedures Explained to Patient/Family Yes  Evaluation and Treatment Procedures agreed to  Wound / Incision (Open or Dehisced) 10/22/17 Other (Comment) Sacrum Medial large wound contaminated  with BM  Date First Assessed/Time First Assessed: 10/22/17 1444   Wound Type: Other (Comment)  Location: Sacrum  Location Orientation: Medial  Wound Description (Comments): large wound contaminated  with BM  Present on Admission: Yes  Dressing Type Moist to dry;Gauze (Comment);ABD;Tape dressing (gentle packing to scrotume, covered with ABD)  Dressing Changed Changed  Dressing Status Clean  Dressing Change Frequency Twice a day  Site / Wound Assessment Bleeding;Granulation tissue;Yellow;Red (loose BM contaminating scrotal wound)  % Wound base Red or Granulating 45%  % Wound base Yellow/Fibrinous Exudate 55%  Peri-wound Assessment Excoriated  Wound Length (cm) 12 cm  Wound Width (cm) 15 cm  Wound Depth (cm) 2 cm  Wound Volume (cm^3) 360 cm^3  Wound Surface Area (cm^2) 180 cm^2  Margins Unattached edges (unapproximated)  Drainage Amount Moderate  Drainage Description No odor (bleeding on perimeter, granulation.)  Non-staged Wound Description Full thickness  Treatment Cleansed;Debridement (Selective);Hydrotherapy (Pulse lavage);Packing (Saline gauze)  Incision (Closed) 10/25/17 Other (Comment)  Date First Assessed/Time First Assessed: 10/25/17 1615   Location: Other (Comment)  Dressing Type  (saline)  Hydrotherapy  Pulsed lavage therapy - wound location sacrum (gentle lavage to scrotumn to wash due to significant pain to)  Pulsed Lavage with Suction (psi) 8 psi (4-8)  Pulsed Lavage with Suction - Normal Saline Used 1000  mL  Pulsed Lavage Tip Tip with splash shield  Selective Debridement  Selective Debridement - Location sacrum  Selective Debridement - Tools Used Forceps;Scalpel  Selective Debridement - Tissue Removed slough  Wound Therapy - Assess/Plan/Recommendations  Wound Therapy - Clinical Statement Pt is a 67 year old male with large, painful, unstageable, sacral pressure injury.  The patient requires extensive assistance for positioning d/t limited UE movement and  R AKA. The patient has resided in SNF for several months and previously received Hydrotherapy for a sacral wound. The patient is on an air mattress; The  patient will benefit from PLS.  Moderate to copious exudate present on old dressing and bed pad. Loose BM  Present and under dressing. Painfull to wash periarea.  Gentle hydro (low psi d/t pain) and  light packing of the scrotal site as well;  Moaning out through out even with premedicated with IV meds and use of PCA several times. Continue Hydro therapy as patient  Will allow.  Wound Therapy - Functional Problem List total assistance for mobility.  Factors Delaying/Impairing Wound Healing Incontinence;Multiple medical problems;Immobility  Hydrotherapy Plan Debridement;Dressing change;Patient/family education;Pulsatile lavage with suction  Wound Therapy - Frequency 6X / week  Wound Therapy - Follow Up Recommendations Skilled nursing facility;Other (comment) (?LTACH)  Wound Plan PLS and dressing changes  Wound Therapy Goals - Improve the function of patient's integumentary system by progressing the wound(s) through the phases of wound healing by:  Decrease Necrotic Tissue to 20  Decrease Necrotic Tissue - Progress Progressing toward goal  Increase Granulation Tissue to 80  Increase Granulation Tissue - Progress Progressing toward goal  Goals/treatment plan/discharge plan were made with and agreed upon by patient/family No, Patient unable to participate in goals/treatment/discharge plan and  family unavailable  Time For Goal Achievement 2 weeks  Wound Therapy - Potential for Goals Poor  Blanchard Kelch PT [phone]

## 2017-11-01 NOTE — Progress Notes (Signed)
Daily Progress Note   Patient Name: [name]       Date: 11/01/2017 DOB: July 20, 1950  Age: 67 y.o. MRN#: [id number] Attending Physician: Meredeth Ide, MD Primary Care Physician: [name], MD (Inactive) Admit Date: 10/20/2017  Reason for Consultation/Follow-up: Establishing goals of care and Pain control  Subjective: Resting in bed on entering room.  Reports having pain and "not a good day," however he last used bolus dosing of PCA > 2 hours ago.  Discussed plan for transition to [facility].  He is agreeable to transition when deemed medically ready.    See below.  Length of Stay: 12  Current Medications: Scheduled Meds:  . aspirin EC  81 mg Oral Daily  . atorvastatin  80 mg Oral q1800  . calcium-vitamin D  1 tablet Oral Daily  . furosemide  20 mg Intravenous Q12H  . gabapentin  600 mg Oral BID  . guaiFENesin  600 mg Oral BID  . HYDROmorphone   Intravenous Q4H  . insulin aspart  0-9 Units Subcutaneous TID WC  . insulin aspart  3 Units Subcutaneous TID WC  . insulin detemir  10 Units Subcutaneous BID  . iopamidol  30 mL Oral Once  . metoprolol tartrate  12.5 mg Oral BID  . multivitamin with minerals  1 tablet Oral Daily  . nutrition supplement (JUVEN)  1 packet Oral BID BM  . pantoprazole  40 mg Oral Daily  . potassium chloride  40 mEq Oral Q4H  . rivaroxaban  20 mg Oral Q supper  . sodium chloride flush  3 mL Intravenous Q12H    Continuous Infusions: . sodium chloride      PRN Meds: acetaminophen **OR** acetaminophen, albuterol, diphenhydrAMINE **OR** diphenhydrAMINE, HYDROmorphone (DILAUDID) injection, naloxone **AND** sodium chloride flush, ondansetron **OR** ondansetron (ZOFRAN) IV, polyvinyl alcohol, white petrolatum  Physical Exam        .  Obese, pale in  appearance  Cardiovascular: Normal rate. An irregularly irregular rhythm present.  Pulmonary/Chest: No accessory muscle usage. No tachypnea. No respiratory distress. regular breaths  Abdominal: Soft. Normal appearance.  Pictures of wound noted Has heel dressing too  Vital Signs: BP 105/85   Pulse (!) 132   Temp (!) 103 F (39.4 C) (Axillary)   Resp (!) 24   Ht 6' (1.829 m)  Wt 125.8 kg (277 lb 5.4 oz)   SpO2 98%   BMI 37.61 kg/m  SpO2: SpO2: 98 % O2 Device: O2 Device: Nasal Cannula O2 Flow Rate: O2 Flow Rate (L/min): 3 L/min  Intake/output summary:   Intake/Output Summary (Last 24 hours) at 11/01/2017 1528 Last data filed at 11/01/2017 1030 Gross per 24 hour  Intake 480 ml  Output 5575 ml  Net -5095 ml   LBM: Last BM Date: 11/01/17 Baseline Weight: Weight: 121.6 kg (268 lb) Most recent weight: Weight: 125.8 kg (277 lb 5.4 oz)      PPS 40% Palliative Assessment/Data:    Flowsheet Rows     Most Recent Value  Intake Tab  Referral Department  Hospitalist  Unit at Time of Referral  Med/Surg Unit  Palliative Care Primary Diagnosis  Sepsis/Infectious Disease  Date Notified  10/21/17  Palliative Care Type  Return patient Palliative Care  Reason for referral  Clarify Goals of Care, Pain  Date of Admission  10/20/17  Date first seen by Palliative Care  10/22/17  # of days Palliative referral response time  1 Day(s)  # of days IP prior to Palliative referral  1  Clinical Assessment  Palliative Performance Scale Score  50%  Pain Max last 24 hours  9  Pain Min Last 24 hours  3  Psychosocial & Spiritual Assessment  Palliative Care Outcomes      Patient Active Problem List   Diagnosis Date Noted  . Protein-calorie malnutrition, severe (HCC) 10/20/2017  . Sepsis (HCC) 10/20/2017  . Occult blood in stools 10/20/2017  . C. difficile diarrhea 10/12/2017  . Hypomagnesemia 10/12/2017  . Septic shock (HCC) 09/29/2017  . Acute encephalopathy   . Palliative care encounter    . UTI (urinary tract infection) 09/01/2017  . Acute metabolic encephalopathy 09/01/2017  . S/P AKA (above knee amputation) unilateral, right (HCC) 09/01/2017  . Atrial fibrillation with rapid ventricular response (HCC)   . Hyponatremia 04/11/2017  . ARF (acute renal failure) (HCC) 04/11/2017  . Pressure injury of skin 03/24/2017  . Below knee amputation status, right (HCC) 03/23/2017  . DKA (diabetic ketoacidoses) (HCC) 03/14/2017  . Elevated troponin 03/14/2017  . Diabetic polyneuropathy associated with type 2 diabetes mellitus (HCC)   . Osteoarthritis of both knees   . Diabetic nephropathy associated with type 2 diabetes mellitus (HCC)   . Bilateral lower leg cellulitis 01/22/2017  . Venous stasis 08/06/2016  . At high risk for falls 07/21/2016  . CHF exacerbation (HCC) 06/10/2016  . Lactic acidosis 06/09/2016  . Syncope and collapse 03/05/2016  . Acute on chronic renal insufficiency 03/05/2016  . Obesity 12/30/2015  . Scrotal swelling   . Foot fracture   . 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 Lower GI bleed   . Pressure ulcer 12/24/2015  . Acute kidney injury (HCC) 12/23/2015  . Back pain 12/23/2015  . Fall   . Metatarsal fracture   . Traumatic rhabdomyolysis (HCC)   . Hyperkalemia   . Lisfranc dislocation   . Hyperglycemia   . Sepsis due to skin infection (HCC) 09/08/2015  . Scrotal wall abscess 09/08/2015  . AKI (acute kidney injury) (HCC) 09/08/2015  . Chest wall pain   . On amiodarone therapy 12/08/2014  . Respiratory failure with hypoxia (HCC) 12/04/2014  . Hypoxia 12/04/2014  . Acute on chronic respiratory failure with hypoxia (HCC)   . Atrial fibrillation with RVR (HCC) 09/20/2014  . Chronic diastolic CHF (congestive heart failure) (HCC) 09/19/2014  .  Acute respiratory failure (HCC) 09/19/2014  . Abnormal nuclear cardiac imaging test 02/12/2014  . Dyspnea 02/12/2014  . Chronic anticoagulation- Coumadin 02/12/2014  . CKD (chronic kidney  disease) stage 3, GFR 30-59 ml/min (HCC) 02/12/2014  . Chronic diastolic congestive heart failure (HCC) 02/11/2014  . Encounter for therapeutic drug monitoring 08/03/2013  . Pilonidal cyst 01/25/2013  . Perirectal cellulitis 09/06/2012  . Long term (current) use of anticoagulants 11/18/2011  . Obstructive sleep apnea- unable to tolerate c-pap 08/04/2011  . CAD S/P percutaneous coronary angioplasty 08/04/2011  . ANEMIA-IRON DEFICIENCY 01/06/2009  . OTHER AND UNSPECIFIED COAGULATION DEFECTS 01/06/2009  . Anemia 01/02/2009  . CARDIOVASCULAR FUNCTION STUDY, ABNORMAL 01/02/2009  . Essential hypertension, benign 10/22/2008  . Atrial fibrillation 10/22/2008  . Hyperlipidemia 09/23/2008  . Hypokalemia 09/23/2008  . Obesity, Class III, BMI 40-49.9 (morbid obesity) (HCC) 09/23/2008  . CELLULITIS AND ABSCESS OF LEG EXCEPT FOOT 09/23/2008    Palliative Care Assessment & Plan   Patient Profile: 67 y.o. male  with past medical history of hypertension, hyperlipidemia, diabetes mellitus, atrial fibrillation onXarelto, OSA not on CPAP, GI bleeding,dCHF,COPD, CKD3,s/p of right AKA admitted on 10/20/2017 with sepsis, a fib, and concern for GI bleeding.  This is his 3rd admission in 6 months and palliative was involved in care during recent admission with discharge on 4/5.  Palliative consulted for goals of care and assistance with pain management.  Recommendations/Plan: -Pain: remains on PCA. Reviewed PCA and noted use of 14.5mg  in the last 24 hours.  PCA not currently set up for basal rate and it appears looking at [facility] numbers that this has likely been the case for the past couple of days.  As he reports pain has been manageable and requests not to change current regimen on PCA, will continue current regimen of 0.5mg  bolus with a 15 minute lockout.  Orders updated to reflect this.  Continue same on discharge if he able to continue this at [facility].   Patient's goals are not comfort-only/hospice focused, at  this time. He wishes to continue with IV Antibiotics, wound care and hydrotherapy. Noted to be approved today for LTACH/ [facility] when medically stable to transfer.   Continue current mode of care for now. To LTACH when able to transfer.  Recommend palliative to follow at [facility].  Code Status:    Code Status Orders  (From admission, onward)        Start     Ordered   10/20/17 1953  Do not attempt resuscitation (DNR)  Continuous    Question Answer Comment  In the event of cardiac or respiratory ARREST Do not call a "code blue"   In the event of cardiac or respiratory ARREST Do not perform Intubation, CPR, defibrillation or ACLS   In the event of cardiac or respiratory ARREST Use medication by any route, position, wound care, and other measures to relive pain and suffering. May use oxygen, suction and manual treatment of airway obstruction as needed for comfort.      10/20/17 1956    Code Status History    Date Active Date Inactive Code Status Order ID Comments User Context   09/29/2017 0457 10/15/2017 0232 Full Code [id number]  Erin Fulling, MD ED   09/08/2017 1026 09/13/2017 2008 DNR [id number]  Alwyn Ren, MD Inpatient   09/01/2017 0535 09/08/2017 1026 Full Code [id number]  Lorretta Harp, MD ED   04/11/2017 0425 04/22/2017 0201 DNR [id number]  Pearson Grippe, MD ED   03/23/2017 1511 03/28/2017 2216 DNR [id number]  Nadara Mustard, MD Inpatient   03/14/2017 0831 03/18/2017 2212 Full Code [id number]  Ozella Rocks, MD ED   03/14/2017 0831 03/14/2017 0831 Full Code [id number]  Ozella Rocks, MD ED   01/22/2017 0857 01/25/2017 2139 Full Code [id number]  Jordan Hawks, DO Inpatient   06/10/2016 0946 06/18/2016 0030 Full Code [id number]  Therisa Doyne, MD Inpatient   12/23/2015 2049 12/30/2015 1821 DNR [id number]  Casey Burkitt, MD Inpatient   09/08/2015 2156 09/17/2015 2240 Full Code [id number]  Ron Parker, MD Inpatient   07/08/2015 2124 07/10/2015 1811 Full Code [id number]  Lorretta Harp, MD ED   12/04/2014 2303 12/11/2014 2124 Full Code [id number]  Pearson Grippe, MD Inpatient   09/19/2014 1806 09/24/2014 1845 Full Code [id number]  Corrie Dandy Inpatient   02/11/2014 1557 02/14/2014 1629 Full Code [id number]  Rosalio Macadamia, NP Inpatient   09/07/2012 0131 09/10/2012 1731 Full Code [id number]  Eduard Clos, MD Inpatient       Prognosis:   Unable to determine  Discharge Planning:  [facility]  Care plan was discussed with patient, RN and CM   Thank you for allowing the Palliative Medicine Team to assist in the care of this patient.   Total Time 25 Prolonged Time Billed No      Greater than 50%  of this time was spent counseling and coordinating care related to the above assessment and plan.  Romie Minus, MD [facility] Palliative Medicine Team [phone]  Please contact Palliative Medicine Team phone at [phone] for questions and concerns.

## 2017-11-02 LAB — URINE CULTURE: Culture: NO GROWTH

## 2017-11-03 DIAGNOSIS — R6521 Severe sepsis with septic shock: Secondary | ICD-10-CM

## 2017-11-03 DIAGNOSIS — I5032 Chronic diastolic (congestive) heart failure: Secondary | ICD-10-CM

## 2017-11-03 DIAGNOSIS — J9621 Acute and chronic respiratory failure with hypoxia: Secondary | ICD-10-CM

## 2017-11-03 DIAGNOSIS — A419 Sepsis, unspecified organism: Secondary | ICD-10-CM

## 2017-11-03 LAB — TYPE AND SCREEN
ABO/RH(D): A NEG
Antibody Screen: NEGATIVE
Unit division: 0
Unit division: 0

## 2017-11-03 LAB — BPAM RBC
Blood Product Expiration Date: 201905152359
Blood Product Expiration Date: 201905172359
ISSUE DATE / TIME: 201904210833
Unit Type and Rh: 600
Unit Type and Rh: 600

## 2017-11-03 NOTE — Progress Notes (Signed)
CRITICAL VALUE ALERT  Critical Value: Lactic Acid 2.0  Date & Time Notied:  4/23 at 1555  Provider Notified: [place]  Orders Received/Actions taken: Vancomycin and Zosyn ordered IV as well as 0.9% saline at .

## 2017-11-04 DIAGNOSIS — I5032 Chronic diastolic (congestive) heart failure: Secondary | ICD-10-CM

## 2017-11-04 DIAGNOSIS — J9621 Acute and chronic respiratory failure with hypoxia: Secondary | ICD-10-CM

## 2017-11-04 DIAGNOSIS — A419 Sepsis, unspecified organism: Secondary | ICD-10-CM

## 2017-11-04 DIAGNOSIS — R6521 Severe sepsis with septic shock: Secondary | ICD-10-CM

## 2017-11-05 DIAGNOSIS — J9621 Acute and chronic respiratory failure with hypoxia: Secondary | ICD-10-CM

## 2017-11-05 DIAGNOSIS — I5032 Chronic diastolic (congestive) heart failure: Secondary | ICD-10-CM

## 2017-11-05 DIAGNOSIS — A419 Sepsis, unspecified organism: Secondary | ICD-10-CM

## 2017-11-05 DIAGNOSIS — R6521 Severe sepsis with septic shock: Secondary | ICD-10-CM

## 2017-11-06 DIAGNOSIS — I5032 Chronic diastolic (congestive) heart failure: Secondary | ICD-10-CM

## 2017-11-06 DIAGNOSIS — R6521 Severe sepsis with septic shock: Secondary | ICD-10-CM

## 2017-11-06 DIAGNOSIS — J9621 Acute and chronic respiratory failure with hypoxia: Secondary | ICD-10-CM

## 2017-11-06 DIAGNOSIS — A419 Sepsis, unspecified organism: Secondary | ICD-10-CM

## 2017-11-06 LAB — CULTURE, BLOOD (ROUTINE X 2)
CULTURE: NO GROWTH
Culture: NO GROWTH
SPECIAL REQUESTS: ADEQUATE
SPECIAL REQUESTS: ADEQUATE

## 2017-11-07 DIAGNOSIS — R6521 Severe sepsis with septic shock: Secondary | ICD-10-CM

## 2017-11-07 DIAGNOSIS — A419 Sepsis, unspecified organism: Secondary | ICD-10-CM

## 2017-11-07 DIAGNOSIS — I5032 Chronic diastolic (congestive) heart failure: Secondary | ICD-10-CM

## 2017-11-07 DIAGNOSIS — J9621 Acute and chronic respiratory failure with hypoxia: Secondary | ICD-10-CM

## 2017-11-08 DIAGNOSIS — J9621 Acute and chronic respiratory failure with hypoxia: Secondary | ICD-10-CM

## 2017-11-08 DIAGNOSIS — A419 Sepsis, unspecified organism: Secondary | ICD-10-CM

## 2017-11-08 DIAGNOSIS — R6521 Severe sepsis with septic shock: Secondary | ICD-10-CM

## 2017-11-08 DIAGNOSIS — I5032 Chronic diastolic (congestive) heart failure: Secondary | ICD-10-CM

## 2017-11-09 DIAGNOSIS — J9621 Acute and chronic respiratory failure with hypoxia: Secondary | ICD-10-CM

## 2017-11-09 DIAGNOSIS — A419 Sepsis, unspecified organism: Secondary | ICD-10-CM

## 2017-11-09 DIAGNOSIS — I5032 Chronic diastolic (congestive) heart failure: Secondary | ICD-10-CM

## 2017-11-09 DIAGNOSIS — R6521 Severe sepsis with septic shock: Secondary | ICD-10-CM

## 2018-02-02 ENCOUNTER — Other Ambulatory Visit: Payer: Self-pay

## 2018-02-02 NOTE — Patient Outreach (Signed)
[facility]) Care Management  02/02/2018  CLIF SERIO [birth year] [id number]   Medication Adherence call to Mr. Velora Mediate patient's telephone number belongs to someone else patient  is due on Atorvastatin 80 mg. Mr. Sissel is showing past due under [employer].  Lillia Abed CPhT Pharmacy Technician [facility] Management Direct Dial [phone]  Fax [phone] [email or web address]

## 2019-03-18 IMAGING — US US RENAL
1 series · 14 of 25 positions shown · non-contrast
Comparison: Renal ultrasound June 10, 2016

CLINICAL DATA: Acute kidney injury superimposed on chronic renal
insufficiency. History of diabetes and hypertension.

EXAM:
RENAL / URINARY TRACT ULTRASOUND COMPLETE

[Series 1: us renal · 14 of 27 slices shown]
[im 1/27]
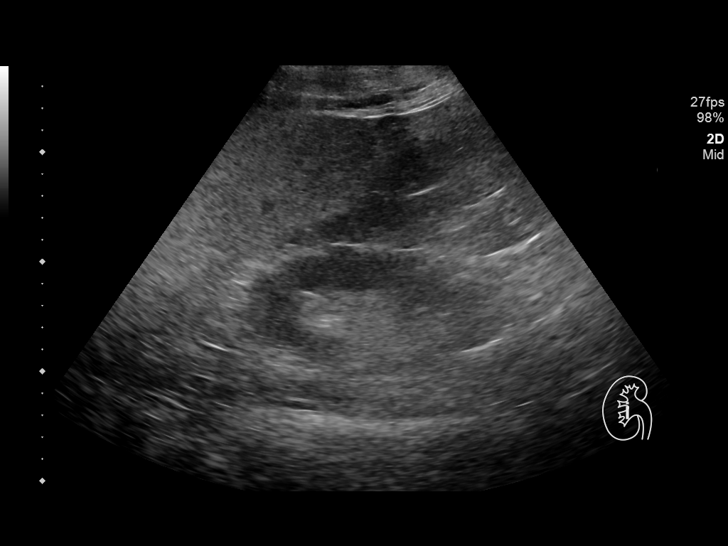
[im 3/27]
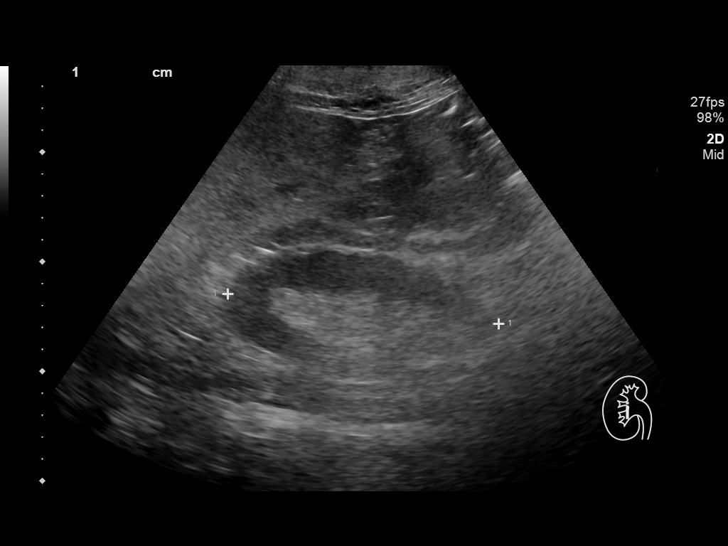
[im 5/27]
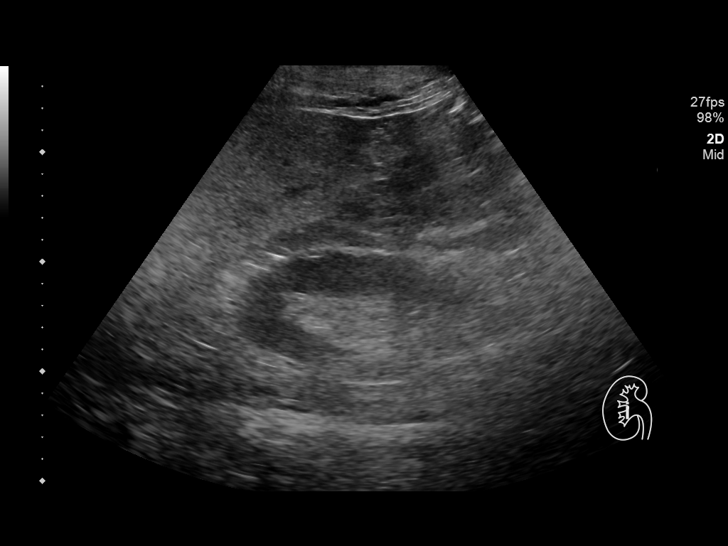
[im 7/27]
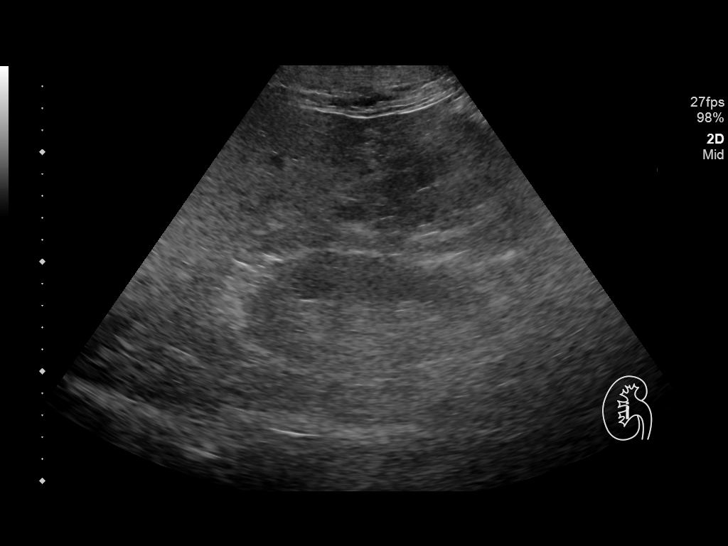
[im 9/27]
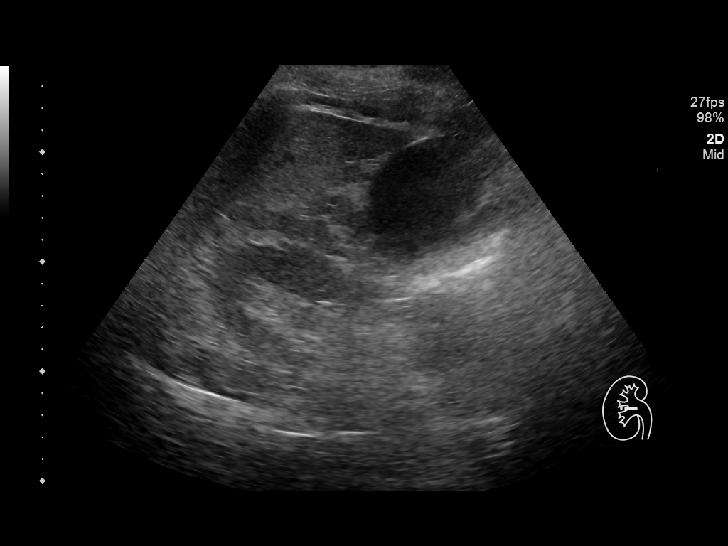
[im 10/27]
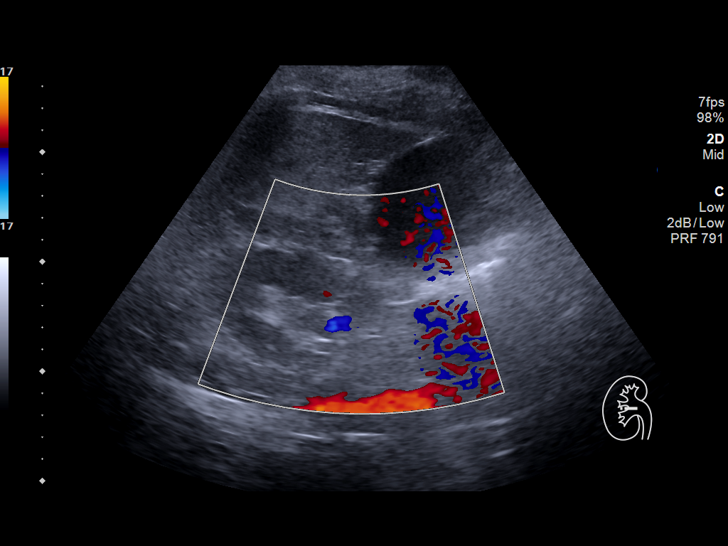
[im 12/27]
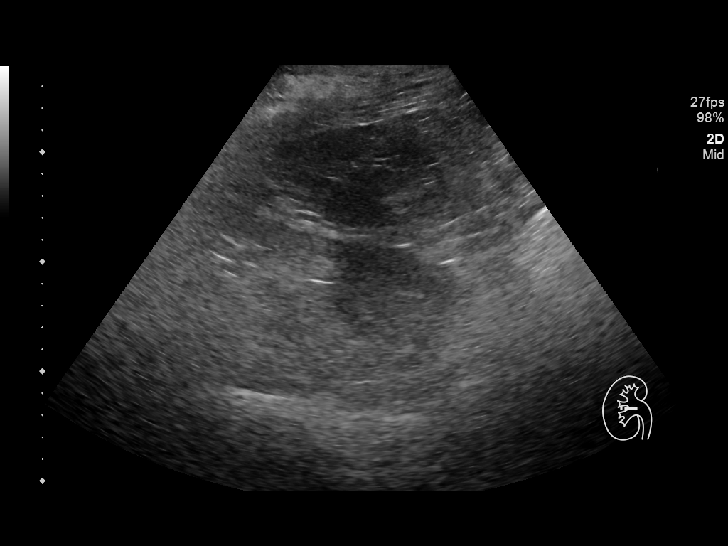
[im 15/27]
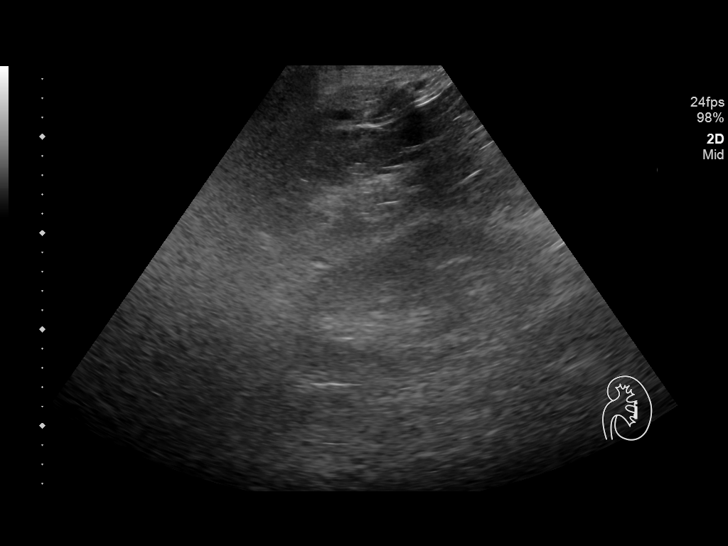
[im 17/27]
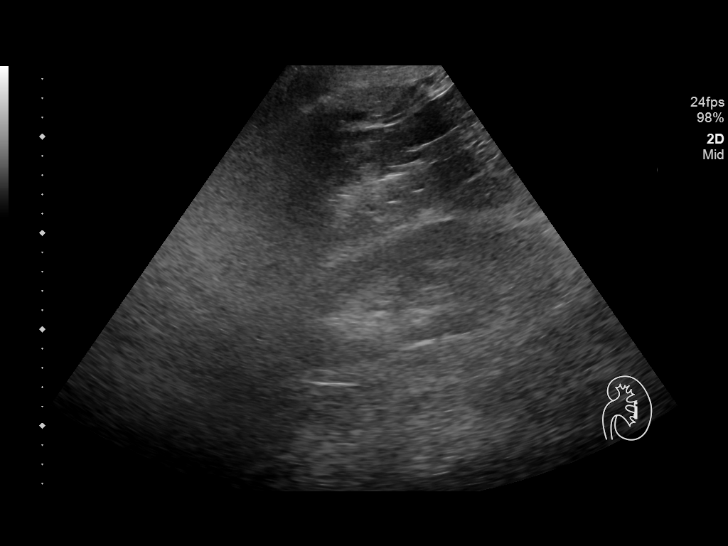
[im 18/27]
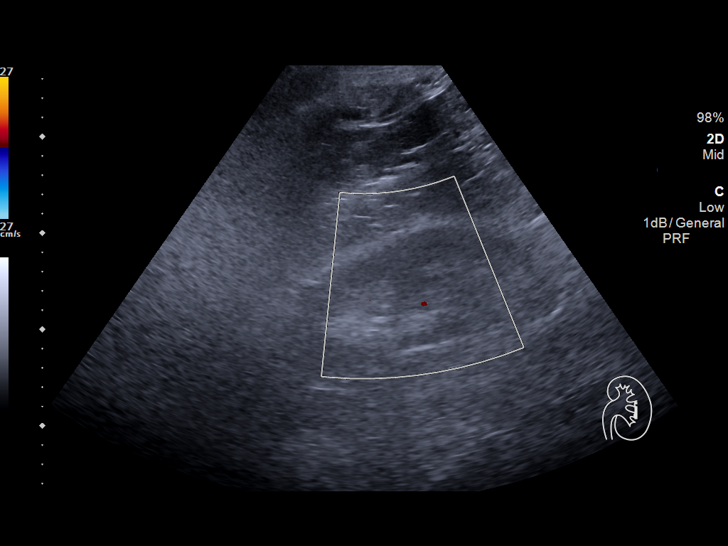
[im 20/27]
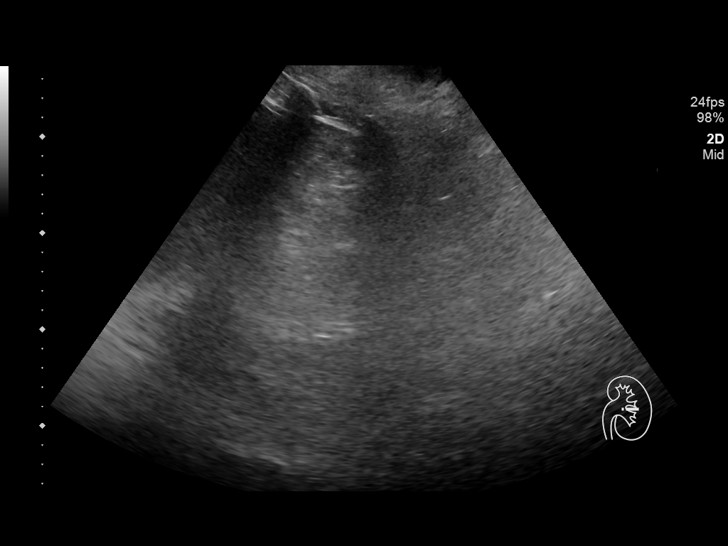
[im 22/27]
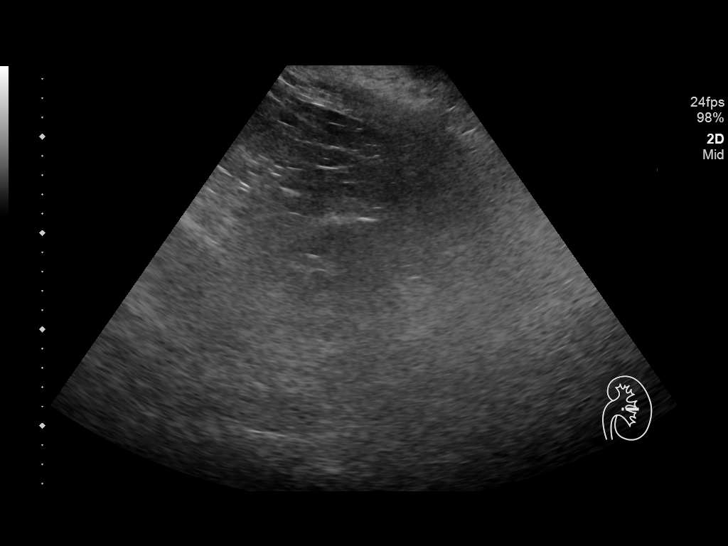
[im 24/27]
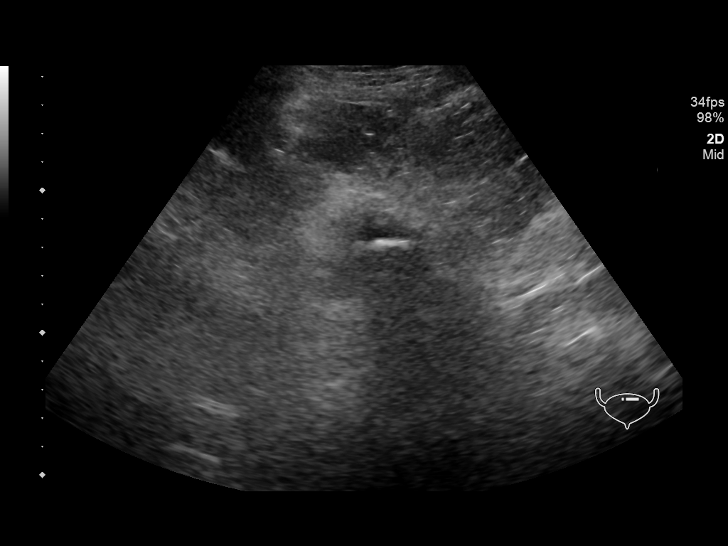
[im 27/27]
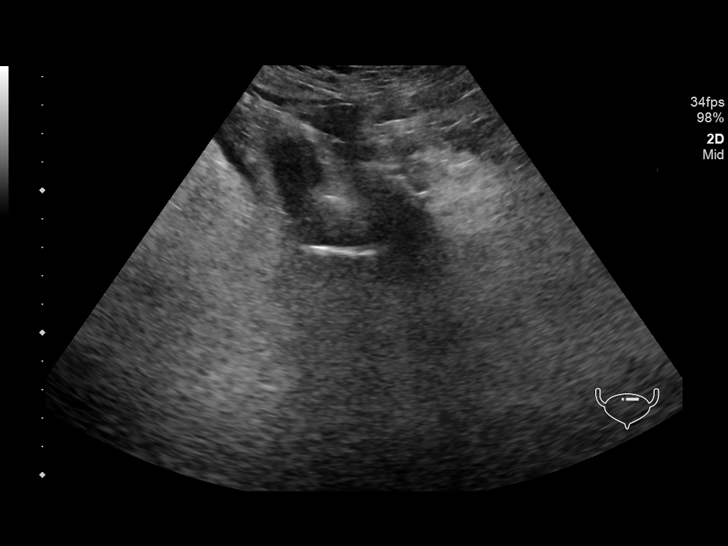

[14 of 25 positions shown; findings below may reference images not displayed]

FINDINGS: Right Kidney:

Length: 12.4 cm. The renal cortical echotexture remains lower than
that of the adjacent liver. There is no hydronephrosis.

Left Kidney:

Length: 13.4 cm. The renal cortical echotexture is similar to that
on the right. There is no hydronephrosis.

Bladder:

The urinary bladder is decompressed by Foley catheter.
IMPRESSION: Normal appearance of both kidneys.  No hydronephrosis.

## 2019-03-18 IMAGING — DX DG CHEST 1V PORT
1 series · 1 of 1 positions shown · non-contrast
Comparison: Radiograph 09/11/2017

CLINICAL DATA: Intubation.

EXAM:
PORTABLE CHEST 1 VIEW

[chest ap]
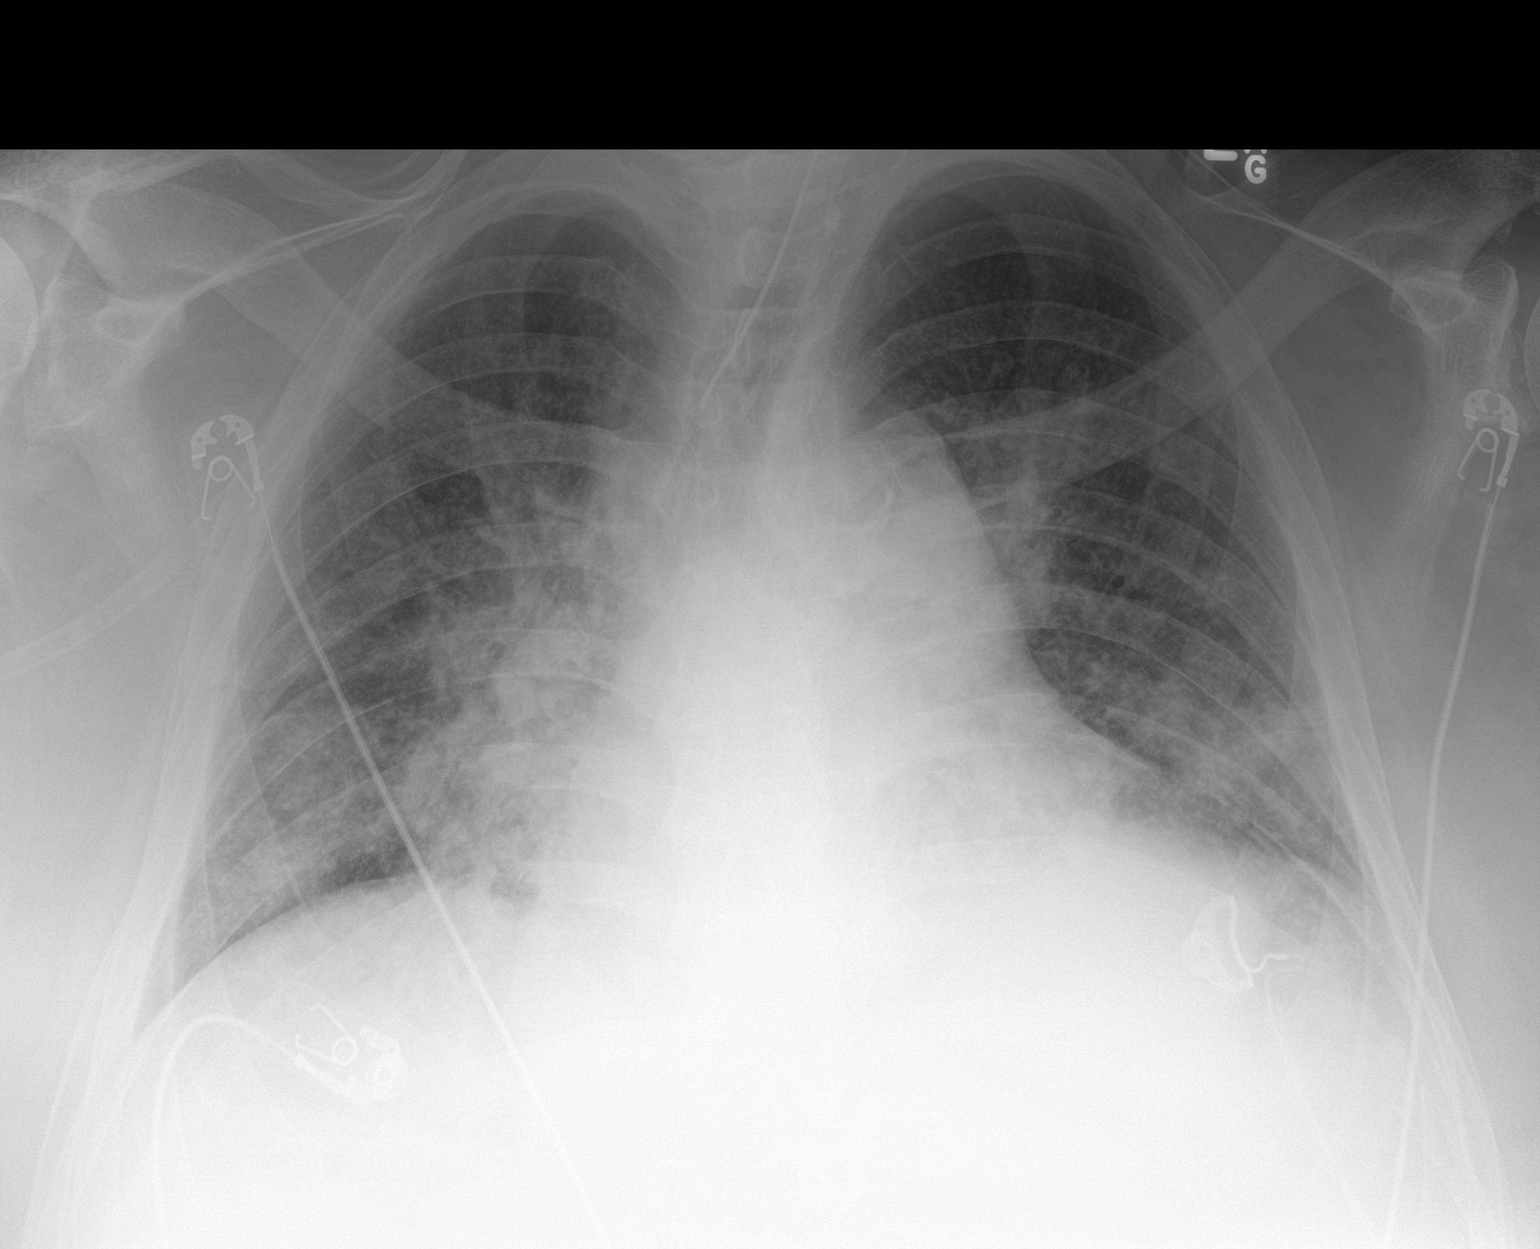

[1 of 1 positions shown; findings below may reference images not displayed]

FINDINGS: Endotracheal tube is 3.4 cm from the carina. Low lung volumes. The
heart is enlarged, similar to prior exam. New bilateral perihilar
opacities. No large pleural effusion or pneumothorax.
IMPRESSION: 1. Endotracheal tube 3.4 cm from the carina.
2. Cardiomegaly. Bilateral perihilar opacities may be pulmonary
edema, aspiration or pneumonia.

## 2019-03-19 IMAGING — DX DG CHEST 1V PORT
1 series · 1 of 1 positions shown · non-contrast
Comparison: 09/29/2017

CLINICAL DATA: Check endotracheal tube position

EXAM:
PORTABLE CHEST 1 VIEW

[chest ap]
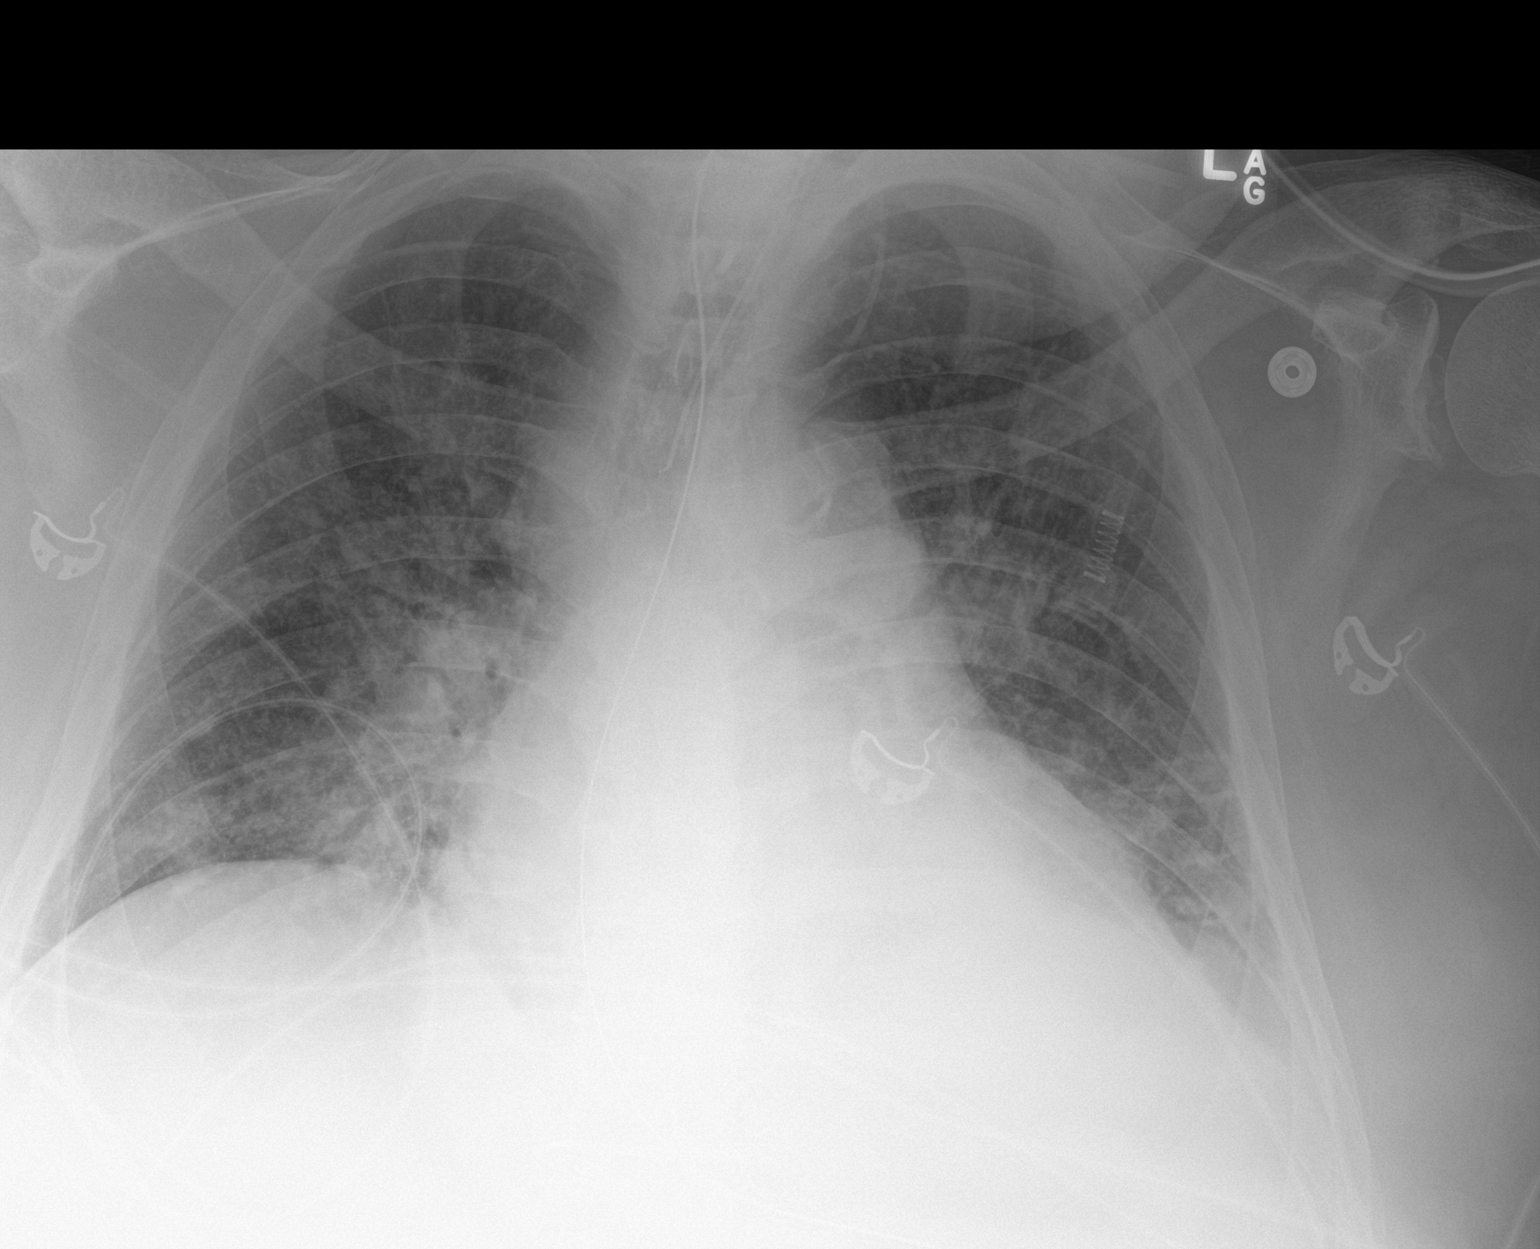

[1 of 1 positions shown; findings below may reference images not displayed]

FINDINGS: Endotracheal tube and nasogastric catheter are noted in satisfactory
position. Cardiac shadow is mildly prominent but stable. Persistent
perihilar changes are noted although slightly improved when compared
with the prior exam. No new focal infiltrate is seen.
IMPRESSION: Slight improved aeration.

Tubes and lines as described.

## 2019-03-20 IMAGING — DX DG CHEST 1V PORT
1 series · 1 of 1 positions shown · non-contrast
Comparison: Yesterday.

CLINICAL DATA: Respiratory failure.

EXAM:
PORTABLE CHEST 1 VIEW

[chest ap]
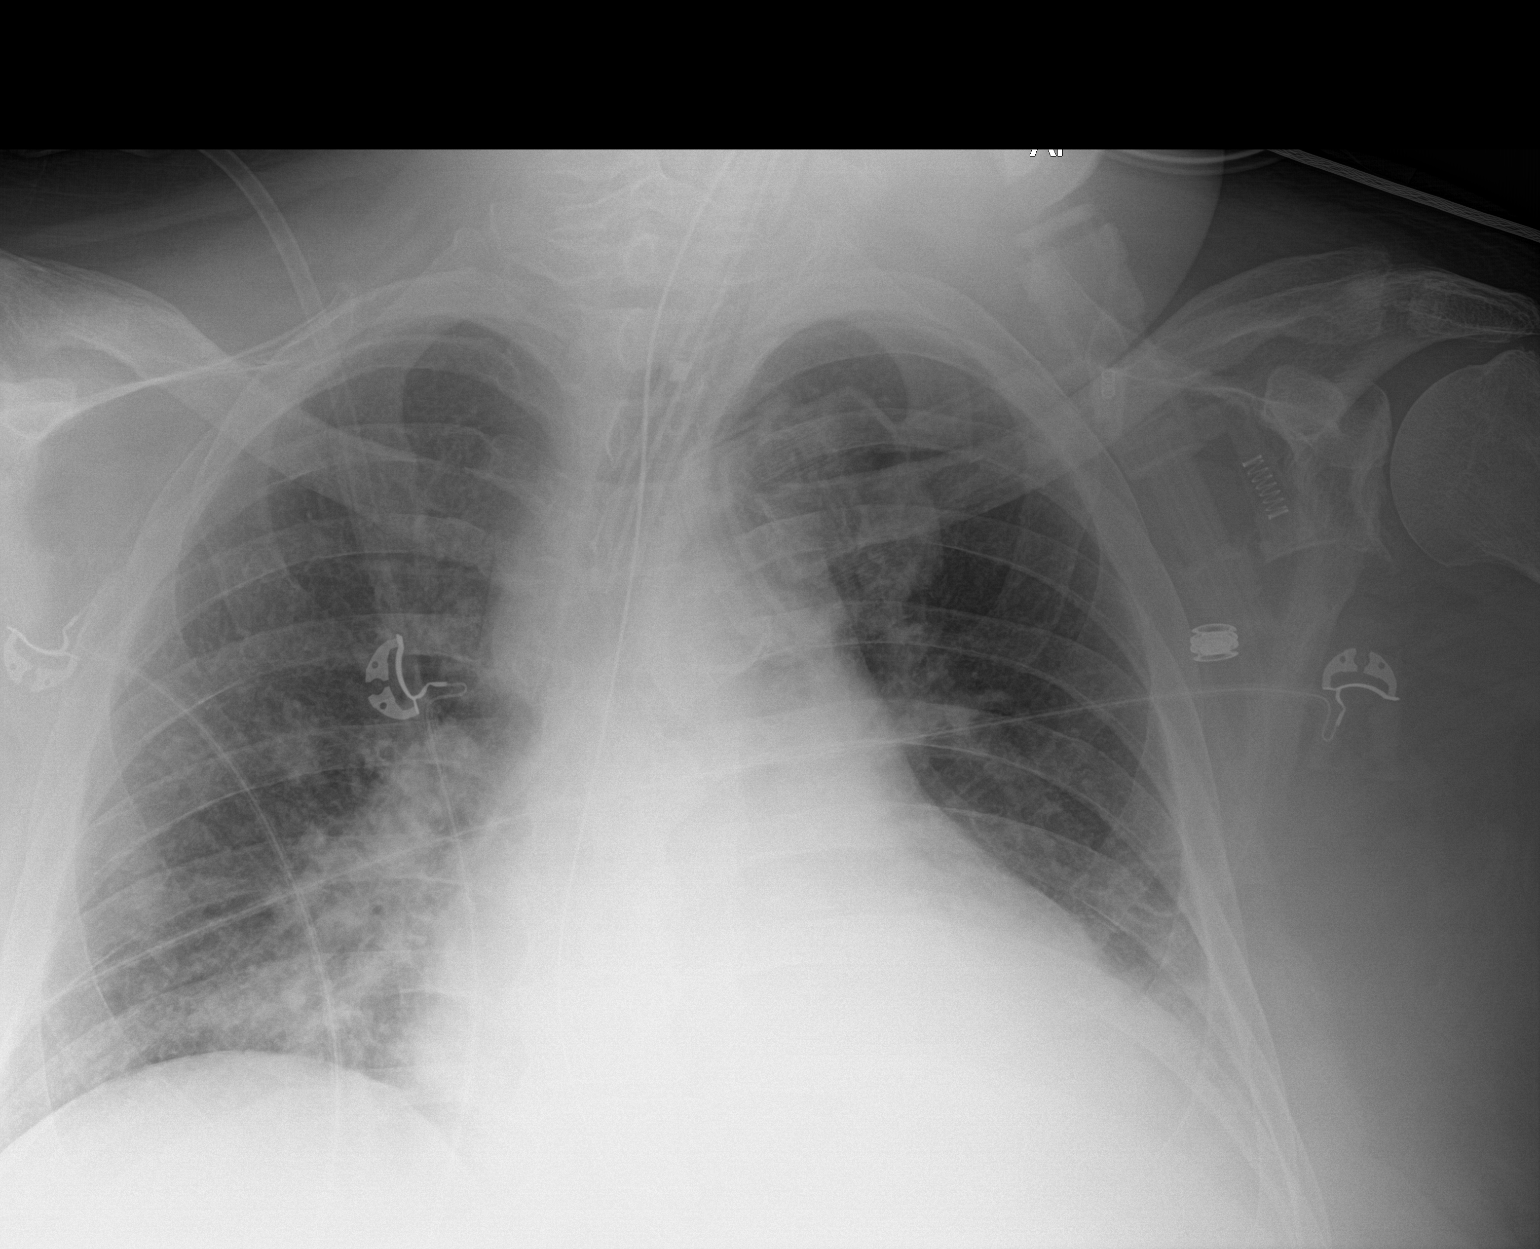

[1 of 1 positions shown; findings below may reference images not displayed]

FINDINGS: Stable enlarged cardiac silhouette. Endotracheal tube is unchanged.
Nasogastric tube extending into the stomach. Increased left lower
lobe airspace opacity. No significant change in patchy opacity in
the right lower lung zone and inferior perihilar region. The
interstitial markings remain prominent. Thoracic spine degenerative
changes.
IMPRESSION: 1. Increased left lower lobe atelectasis or pneumonia.
2. No significant change in right lower lung zone atelectasis and
possible pneumonia.
3. Stable cardiomegaly and chronic interstitial lung disease.

## 2019-03-21 IMAGING — DX DG CHEST 1V PORT
1 series · 1 of 1 positions shown · non-contrast
Comparison: 10/01/2017.

CLINICAL DATA: Respiratory failure.  Ex-smoker.

EXAM:
PORTABLE CHEST 1 VIEW

[chest ap]
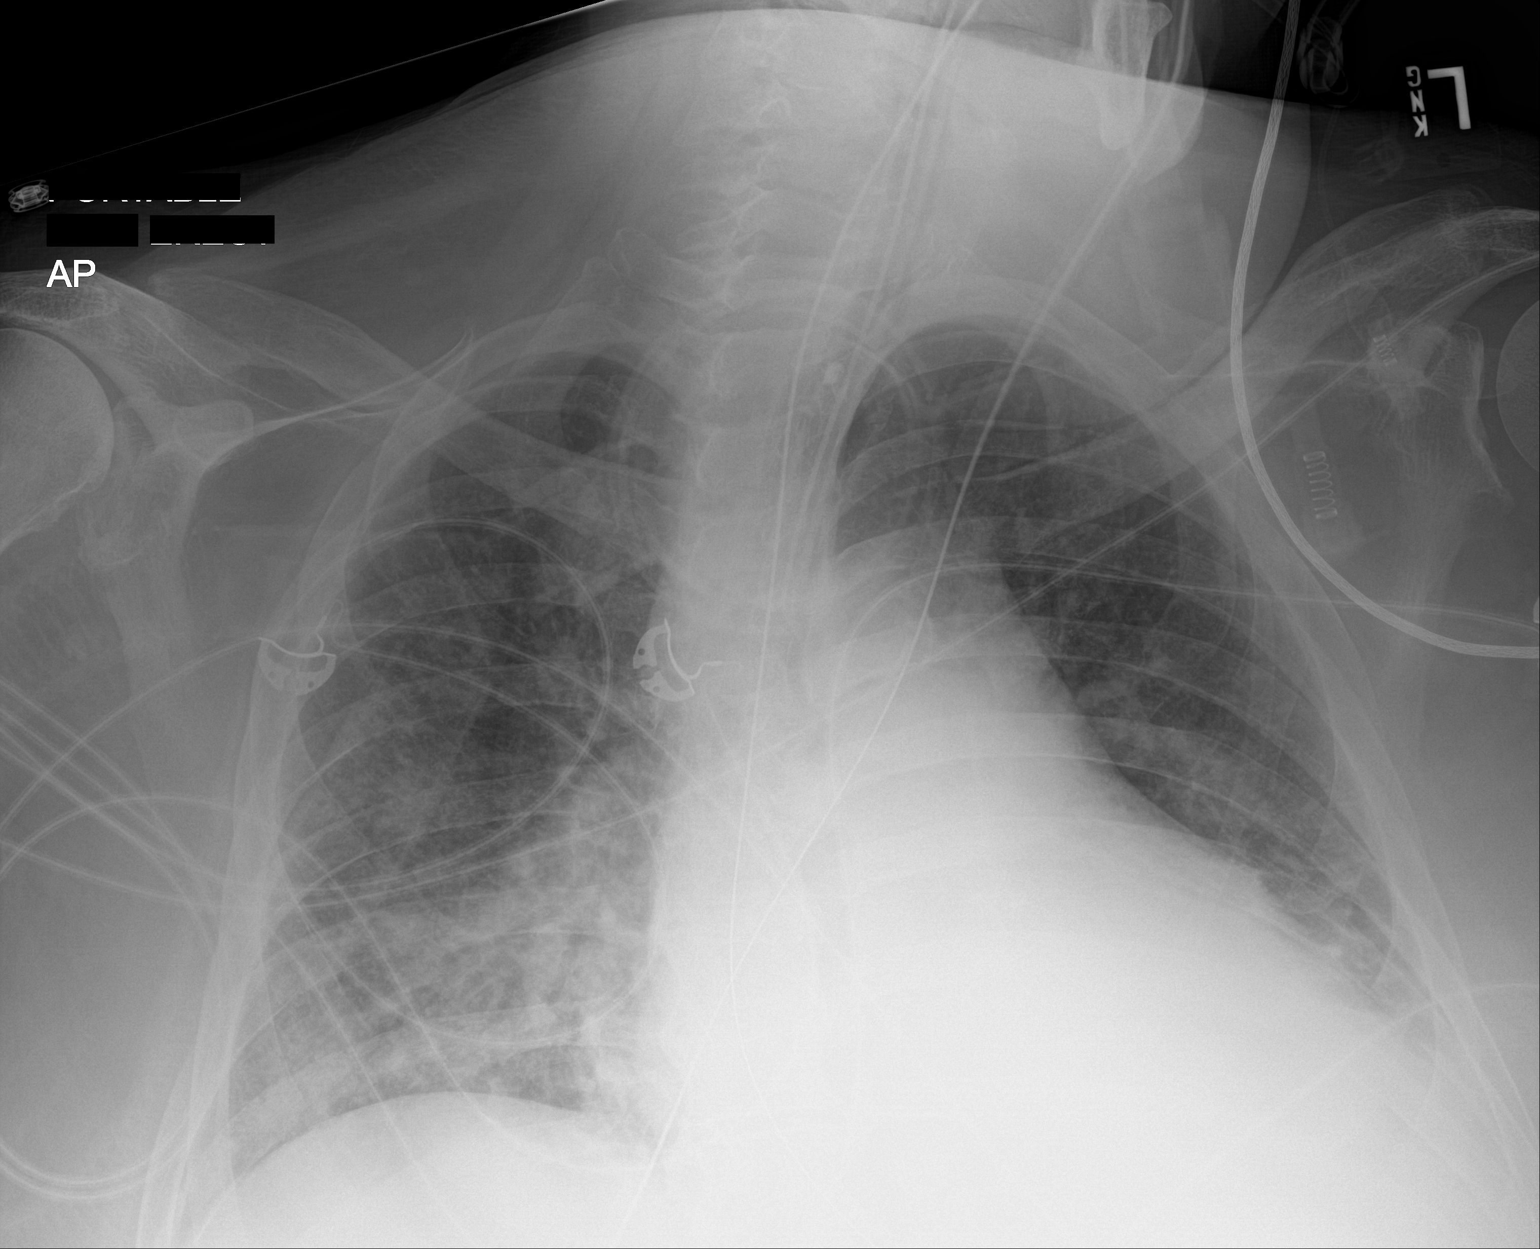

[1 of 1 positions shown; findings below may reference images not displayed]

FINDINGS: Endotracheal tube in satisfactory position. Nasogastric tube
extending into the stomach. Stable enlarged cardiac silhouette. Mild
increase in dense airspace opacity at the left lung base. Increased
patchy opacity in the right lower lung zone. Stable prominence of
the interstitial markings. Thoracic spine degenerative changes.
IMPRESSION: 1. Mild further increase in dense left lower lobe atelectasis or
pneumonia.
2. Increased probable pneumonia in the right lower lung zone.
3. Stable cardiomegaly and chronic interstitial lung disease.
